# Patient Record
Sex: Female | Born: 1955
Health system: Southern US, Community
[De-identification: ages and names within clinical notes are randomized; demographics above are authoritative.]

## PROBLEM LIST (undated history)

## (undated) ENCOUNTER — Telehealth

## (undated) ENCOUNTER — Ambulatory Visit: Payer: MEDICARE | Attending: Nephrology | Primary: Nephrology

## (undated) ENCOUNTER — Ambulatory Visit

## (undated) ENCOUNTER — Ambulatory Visit: Payer: MEDICARE | Attending: Foot & Ankle Surgery | Primary: Foot & Ankle Surgery

## (undated) ENCOUNTER — Ambulatory Visit: Payer: MEDICARE

## (undated) ENCOUNTER — Encounter

## (undated) ENCOUNTER — Encounter: Attending: Nephrology | Primary: Nephrology

## (undated) ENCOUNTER — Ambulatory Visit
Payer: MEDICARE | Attending: Student in an Organized Health Care Education/Training Program | Primary: Student in an Organized Health Care Education/Training Program

## (undated) ENCOUNTER — Non-Acute Institutional Stay: Payer: MEDICARE

## (undated) ENCOUNTER — Encounter
Attending: Student in an Organized Health Care Education/Training Program | Primary: Student in an Organized Health Care Education/Training Program

## (undated) ENCOUNTER — Telehealth
Attending: Student in an Organized Health Care Education/Training Program | Primary: Student in an Organized Health Care Education/Training Program

## (undated) ENCOUNTER — Encounter: Attending: Ambulatory Care | Primary: Ambulatory Care

## (undated) ENCOUNTER — Ambulatory Visit: Payer: MEDICARE | Attending: Surgery | Primary: Surgery

## (undated) ENCOUNTER — Encounter: Payer: MEDICARE | Attending: Urology | Primary: Urology

## (undated) ENCOUNTER — Encounter: Payer: MEDICARE | Attending: Dentist | Primary: Dentist

## (undated) ENCOUNTER — Encounter
Payer: MEDICARE | Attending: Student in an Organized Health Care Education/Training Program | Primary: Student in an Organized Health Care Education/Training Program

## (undated) ENCOUNTER — Telehealth: Attending: Nephrology | Primary: Nephrology

## (undated) ENCOUNTER — Ambulatory Visit: Attending: Internal Medicine | Primary: Internal Medicine

## (undated) ENCOUNTER — Encounter: Payer: MEDICARE | Attending: Nephrology | Primary: Nephrology

## (undated) ENCOUNTER — Ambulatory Visit: Payer: MEDICARE | Attending: Urology | Primary: Urology

## (undated) DIAGNOSIS — I779 Disorder of arteries and arterioles, unspecified: Secondary | ICD-10-CM

## (undated) DIAGNOSIS — I2510 Atherosclerotic heart disease of native coronary artery without angina pectoris: Secondary | ICD-10-CM

## (undated) DIAGNOSIS — E11319 Type 2 diabetes mellitus with unspecified diabetic retinopathy without macular edema: Secondary | ICD-10-CM

## (undated) DIAGNOSIS — Z992 Dependence on renal dialysis: Secondary | ICD-10-CM

## (undated) DIAGNOSIS — N186 End stage renal disease: Secondary | ICD-10-CM

## (undated) DIAGNOSIS — I219 Acute myocardial infarction, unspecified: Secondary | ICD-10-CM

## (undated) DIAGNOSIS — K635 Polyp of colon: Secondary | ICD-10-CM

## (undated) DIAGNOSIS — I209 Angina pectoris, unspecified: Secondary | ICD-10-CM

## (undated) DIAGNOSIS — Z8709 Personal history of other diseases of the respiratory system: Secondary | ICD-10-CM

## (undated) DIAGNOSIS — Z87891 Personal history of nicotine dependence: Secondary | ICD-10-CM

## (undated) DIAGNOSIS — U071 COVID-19: Secondary | ICD-10-CM

## (undated) DIAGNOSIS — E785 Hyperlipidemia, unspecified: Secondary | ICD-10-CM

## (undated) DIAGNOSIS — I739 Peripheral vascular disease, unspecified: Secondary | ICD-10-CM

## (undated) DIAGNOSIS — D638 Anemia in other chronic diseases classified elsewhere: Secondary | ICD-10-CM

## (undated) DIAGNOSIS — I340 Nonrheumatic mitral (valve) insufficiency: Secondary | ICD-10-CM

## (undated) DIAGNOSIS — I10 Essential (primary) hypertension: Secondary | ICD-10-CM

## (undated) DIAGNOSIS — I5032 Chronic diastolic (congestive) heart failure: Secondary | ICD-10-CM

## (undated) DIAGNOSIS — K5909 Other constipation: Secondary | ICD-10-CM

## (undated) DIAGNOSIS — Z8701 Personal history of pneumonia (recurrent): Secondary | ICD-10-CM

## (undated) DIAGNOSIS — E1140 Type 2 diabetes mellitus with diabetic neuropathy, unspecified: Secondary | ICD-10-CM

## (undated) DIAGNOSIS — J449 Chronic obstructive pulmonary disease, unspecified: Secondary | ICD-10-CM

## (undated) DIAGNOSIS — I38 Endocarditis, valve unspecified: Secondary | ICD-10-CM

## (undated) DIAGNOSIS — E041 Nontoxic single thyroid nodule: Secondary | ICD-10-CM

## (undated) DIAGNOSIS — D573 Sickle-cell trait: Secondary | ICD-10-CM

## (undated) HISTORY — DX: Disorder of arteries and arterioles, unspecified: I77.9

## (undated) HISTORY — PX: KIDNEY TRANSPLANT: SHX239

## (undated) HISTORY — DX: Personal history of other diseases of the respiratory system: Z87.09

## (undated) HISTORY — DX: Endocarditis, valve unspecified: I38

## (undated) HISTORY — PX: PTCA: SHX146

## (undated) HISTORY — PX: ESOPHAGOGASTRODUODENOSCOPY: SHX1529

## (undated) HISTORY — DX: COVID-19: U07.1

## (undated) HISTORY — DX: Acute myocardial infarction, unspecified: I21.9

## (undated) HISTORY — PX: CYSTOURETHROSCOPY: SHX476

## (undated) HISTORY — PX: ABDOMINAL HYSTERECTOMY: SHX81

## (undated) HISTORY — DX: Nonrheumatic mitral (valve) insufficiency: I34.0

## (undated) HISTORY — PX: DILATION AND CURETTAGE OF UTERUS: SHX78

## (undated) HISTORY — DX: Peripheral vascular disease, unspecified: I73.9

## (undated) HISTORY — DX: Nontoxic single thyroid nodule: E04.1

## (undated) HISTORY — DX: Atherosclerotic heart disease of native coronary artery without angina pectoris: I25.10

## (undated) HISTORY — DX: Chronic diastolic (congestive) heart failure: I50.32

## (undated) HISTORY — PX: OTHER SURGICAL HISTORY: SHX169

## (undated) HISTORY — PX: TUBAL LIGATION: SHX77

## (undated) HISTORY — PX: CARDIAC CATHETERIZATION: SHX172

## (undated) HISTORY — DX: Personal history of pneumonia (recurrent): Z87.01

## (undated) HISTORY — PX: EYE SURGERY: SHX253

## (undated) HISTORY — DX: Anemia in other chronic diseases classified elsewhere: D63.8

## (undated) HISTORY — PX: THROMBECTOMY / ARTERIOVENOUS GRAFT REVISION: SUR1351

## (undated) HISTORY — DX: Personal history of nicotine dependence: Z87.891

## (undated) MED ORDER — PREGABALIN 75 MG CAPSULE: capsule | 3 refills | 30 days

---
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
## 2006-03-28 ENCOUNTER — Other Ambulatory Visit: Payer: Self-pay

## 2006-03-28 ENCOUNTER — Emergency Department: Payer: Self-pay | Admitting: Emergency Medicine

## 2007-09-15 ENCOUNTER — Emergency Department: Payer: Self-pay | Admitting: Emergency Medicine

## 2008-07-24 ENCOUNTER — Emergency Department: Payer: Self-pay | Admitting: Unknown Physician Specialty

## 2010-06-21 ENCOUNTER — Emergency Department: Payer: Self-pay | Admitting: Unknown Physician Specialty

## 2010-06-21 LAB — BASIC METABOLIC PANEL WITH GFR: Creatinine: 1.6 mg/dL — AB (ref 0.5–1.1)

## 2010-11-05 ENCOUNTER — Ambulatory Visit: Payer: Self-pay | Admitting: Family Medicine

## 2010-11-10 ENCOUNTER — Inpatient Hospital Stay: Payer: Self-pay | Admitting: Internal Medicine

## 2011-01-22 ENCOUNTER — Ambulatory Visit: Payer: Self-pay | Admitting: Family Medicine

## 2011-03-18 ENCOUNTER — Encounter (INDEPENDENT_AMBULATORY_CARE_PROVIDER_SITE_OTHER): Payer: 59 | Admitting: Ophthalmology

## 2011-03-18 DIAGNOSIS — H2510 Age-related nuclear cataract, unspecified eye: Secondary | ICD-10-CM

## 2011-03-18 DIAGNOSIS — H43819 Vitreous degeneration, unspecified eye: Secondary | ICD-10-CM

## 2011-03-18 DIAGNOSIS — E11359 Type 2 diabetes mellitus with proliferative diabetic retinopathy without macular edema: Secondary | ICD-10-CM

## 2011-03-18 DIAGNOSIS — H4310 Vitreous hemorrhage, unspecified eye: Secondary | ICD-10-CM

## 2011-03-21 ENCOUNTER — Encounter (INDEPENDENT_AMBULATORY_CARE_PROVIDER_SITE_OTHER): Payer: 59 | Admitting: Ophthalmology

## 2011-03-21 DIAGNOSIS — E11359 Type 2 diabetes mellitus with proliferative diabetic retinopathy without macular edema: Secondary | ICD-10-CM

## 2011-03-26 ENCOUNTER — Encounter (INDEPENDENT_AMBULATORY_CARE_PROVIDER_SITE_OTHER): Payer: 59 | Admitting: Ophthalmology

## 2011-04-09 ENCOUNTER — Encounter (INDEPENDENT_AMBULATORY_CARE_PROVIDER_SITE_OTHER): Payer: 59 | Admitting: Ophthalmology

## 2011-04-09 DIAGNOSIS — E11359 Type 2 diabetes mellitus with proliferative diabetic retinopathy without macular edema: Secondary | ICD-10-CM

## 2011-04-09 DIAGNOSIS — H4310 Vitreous hemorrhage, unspecified eye: Secondary | ICD-10-CM

## 2011-04-15 DIAGNOSIS — E113593 Type 2 diabetes mellitus with proliferative diabetic retinopathy without macular edema, bilateral: Secondary | ICD-10-CM

## 2011-04-15 DIAGNOSIS — H4313 Vitreous hemorrhage, bilateral: Secondary | ICD-10-CM

## 2011-04-15 DIAGNOSIS — H3343 Traction detachment of retina, bilateral: Secondary | ICD-10-CM

## 2011-04-15 NOTE — H&P (Signed)
Theresa [name] is an 55 y.o. female.   Chief Complaint:  Severe decrease in vision OS and OD HPI: 55 yo bf with severe diabetes mellitus.  Has lost vision.  No past medical history on file.: COPD   No past surgical history on file.  No family history on file. +diabetes in many family members Social History:  does not have a smoking history on file. She does not have any smokeless tobacco history on file. Her alcohol and drug histories not on file. Smoked cigaretts until Feb 2012.Marland Kitchen  Stopped alcohol use 15 yrs ago  Allergies: Allergies not on file  No current facility-administered medications on file as of .   No current outpatient prescriptions on file as of .    Review of systems otherwise negative  There were no vitals taken for this visit.  Physical exam: Mental status: oriented x3. Eyes: See scanned eye exam associated with this date/encounter Ears, [name], Throat: within normal limits Neck: Within Normal limits General: within normal limits Chest: Within normal limits Breast: deferred Heart: Systolic murmur Abdomen: Within normal limits GU: deferred Extremities: within normal limits Skin: within normal limits  Assessment/Plan Proliferative diabetic retinopathy and traction retinal detachment OS Plan: To [facility] for repair of tractional retinal detachment OS  Theresa [name] 04/15/2011, 3:08 PM

## 2011-04-16 ENCOUNTER — Encounter (HOSPITAL_COMMUNITY): Payer: Self-pay | Admitting: Pharmacy Technician

## 2011-04-17 ENCOUNTER — Encounter (HOSPITAL_COMMUNITY)
Admission: RE | Admit: 2011-04-17 | Discharge: 2011-04-17 | Disposition: A | Discharge status: Home / Self Care | Payer: 59 | Source: Ambulatory Visit | Attending: Ophthalmology | Admitting: Ophthalmology

## 2011-04-17 ENCOUNTER — Ambulatory Visit (HOSPITAL_COMMUNITY)
Admission: RE | Admit: 2011-04-17 | Discharge: 2011-04-17 | Disposition: A | Discharge status: Home / Self Care | Payer: 59 | Source: Ambulatory Visit | Attending: Ophthalmology | Admitting: Ophthalmology

## 2011-04-17 ENCOUNTER — Encounter (HOSPITAL_COMMUNITY): Payer: Self-pay

## 2011-04-17 DIAGNOSIS — Z01812 Encounter for preprocedural laboratory examination: Secondary | ICD-10-CM | POA: Insufficient documentation

## 2011-04-17 DIAGNOSIS — J4489 Other specified chronic obstructive pulmonary disease: Secondary | ICD-10-CM | POA: Insufficient documentation

## 2011-04-17 DIAGNOSIS — J449 Chronic obstructive pulmonary disease, unspecified: Secondary | ICD-10-CM | POA: Insufficient documentation

## 2011-04-17 DIAGNOSIS — I10 Essential (primary) hypertension: Secondary | ICD-10-CM | POA: Insufficient documentation

## 2011-04-17 DIAGNOSIS — Z01818 Encounter for other preprocedural examination: Secondary | ICD-10-CM | POA: Insufficient documentation

## 2011-04-17 HISTORY — DX: Sickle-cell trait: D57.3

## 2011-04-17 HISTORY — DX: Polyp of colon: K63.5

## 2011-04-17 HISTORY — DX: Other constipation: K59.09

## 2011-04-17 HISTORY — DX: Chronic obstructive pulmonary disease, unspecified: J44.9

## 2011-04-17 HISTORY — DX: Essential (primary) hypertension: I10

## 2011-04-17 HISTORY — DX: Peripheral vascular disease, unspecified: I73.9

## 2011-04-17 HISTORY — DX: Hyperlipidemia, unspecified: E78.5

## 2011-04-17 LAB — CBC
MCH: 30 pg (ref 26.0–34.0)
Platelets: 307 10*3/uL (ref 150–400)
RBC: 3.1 MIL/uL — ABNORMAL LOW (ref 3.87–5.11)
WBC: 13 10*3/uL — ABNORMAL HIGH (ref 4.0–10.5)

## 2011-04-17 LAB — SURGICAL PCR SCREEN
MRSA, PCR: NEGATIVE
Staphylococcus aureus: NEGATIVE

## 2011-04-17 LAB — BASIC METABOLIC PANEL
CO2: 24 mEq/L (ref 19–32)
Calcium: 9.6 mg/dL (ref 8.4–10.5)
Potassium: 4.9 mEq/L (ref 3.5–5.1)
Sodium: 140 mEq/L (ref 135–145)

## 2011-04-17 NOTE — Progress Notes (Deleted)
Also requested echo and ekg from [facility]

## 2011-04-17 NOTE — Pre-Procedure Instructions (Signed)
[place]  04/17/2011   Your procedure is scheduled on: Tues,Nov 13th  Report to [place] at  Call this number if you have problems the morning of surgery: [phone]   Remember:   Do not eat food:After Midnight.  Do not drink clear liquids: 4 Hours before arrival.  Take these medicines the morning of surgery with A SIP OF WATER:Albuterol,QVAR,Nasal Spray,Gapapentin,Losartan--Bring your inhalers with you!!  Do not wear jewelry, make-up or nail polish.  Do not wear lotions, powders, or perfumes. You may wear deodorant.  Do not shave 48 hours prior to surgery.  Do not bring valuables to the hospital.  Contacts, dentures or bridgework may not be worn into surgery.  Leave suitcase in the car. After surgery it may be brought to your room.  For patients admitted to the hospital, checkout time is 11:00 AM the day of discharge.   Patients discharged the day of surgery will not be allowed to drive home.  Name and phone number of your driver: family  Special Instructions: CHG Shower Use Special Wash: 1/2 bottle night before surgery and 1/2 bottle morning of surgery.   Please read over the following fact sheets that you were given: Pain Booklet, Coughing and Deep Breathing, MRSA Information and Surgical Site Infection Prevention

## 2011-04-17 NOTE — Progress Notes (Signed)
Requested EKG and ECHO from [facility] as well

## 2011-04-17 NOTE — Progress Notes (Deleted)
ekg and echo requested from Dr.[name] office

## 2011-04-17 NOTE — Progress Notes (Signed)
Pt doesn't have a cardiologist;pt is maintained by medical MD for HTN/Hyperlipidemia

## 2011-04-19 ENCOUNTER — Encounter (HOSPITAL_COMMUNITY): Payer: Self-pay

## 2011-04-21 MED ORDER — TROPICAMIDE 1 % OP SOLN
1.0000 [drp] | OPHTHALMIC | Status: AC
  Administered 2011-04-22 (×3): 1 [drp] via OPHTHALMIC

## 2011-04-21 MED ORDER — PHENYLEPHRINE HCL 2.5 % OP SOLN
1.0000 [drp] | OPHTHALMIC | Status: AC
  Administered 2011-04-22 (×3): 1 [drp] via OPHTHALMIC
  Filled 2011-04-21: qty 3

## 2011-04-21 MED ORDER — GATIFLOXACIN 0.5 % OP SOLN
1.0000 [drp] | OPHTHALMIC | Status: AC
  Administered 2011-04-22 (×3): 1 [drp] via OPHTHALMIC

## 2011-04-21 MED ORDER — ATROPINE SULFATE 1 % OP SOLN
1.0000 [drp] | OPHTHALMIC | Status: AC
  Administered 2011-04-22 (×3): 1 [drp] via OPHTHALMIC

## 2011-04-21 NOTE — Consult Note (Signed)
Anesthesia:  Patient is a 55 year old female for vitrectomy on 04/22/11.  Hx + for HTN, hyperlipidemia, DM, COPD, R PNA 11/2010, PVD, CRI, and Sickle Cell trait.  She does not see a Cardiologist per PAT RN notes, but she did have an echocardiogram read by Cardiologist Dr. Clayborn Bigness on 11/10/10.  This showed EF 45-50%, mild global hypokinesis of the LV, mild MS, mod-sev MR, mild-mod TR, mild AR.  Per the [facility] discharge summary from this visit, Cardiology was not actually consulted.  Her Cr around that time was 1.6-1.8.  Preoperative labs now show a Cr. 2.24, BUN 31.  H/H 9.3/28.2, WBC 13.0.  Her CXR showed no acute process.  I reviewed her cardiac and renal hx with Dr. Orene Desanctis.  Her Anesthesiologist will evaluate her pre-operatively, and if patient appears asymptomatic from her valvular disease then anticipate that she can proceed with this procedure.

## 2011-04-21 NOTE — Consult Note (Signed)
Anesthesia:  See my previous note.  I had left Theresa [name] a message earlier today, and she just returned my phone call.  She reports no one has reviewed her prior echo results with her.  She says her PCP does have a copy of these records.  She denies CP/SOB/edema.  I gave her a general summary of her echo and renal results and encouraged her discuss these findings with her PCP, as she will eventually need follow-up.  She says she plans to discuss this with her PCP at her next appointment in 06/2011.

## 2011-04-21 NOTE — Anesthesia Preprocedure Evaluation (Addendum)
Anesthesia Evaluation  Patient identified by MRN, date of birth, ID band Patient awake    Reviewed: Allergy & Precautions, H&P , NPO status , Patient's Chart, lab work & pertinent test results  Airway Mallampati: I TM Distance: >3 FB Neck ROM: Full    Dental  (+) Teeth Intact and Dental Advisory Given   Pulmonary pneumonia , COPD   Pulmonary exam normal       Cardiovascular hypertension, II+ Valvular Problems/Murmurs (Mild mitral stenosis)  No Cardiologist, but had echo at [facility] 11/10/10 showing EF 45-50%, mild MS, mod-sev MR, mild-mod TR, mild AR  EKG on paper chart from 11/2010 NSR   Neuro/Psych    GI/Hepatic   Endo/Other  Diabetes mellitus-, Well Controlled, Type 2, Oral Hypoglycemic Agents  Renal/GU Renal InsufficiencyRenal disease (Cr. 2.24 on 04/17/11. Will rec to Dr. Zigmund Daniel to repeat and/or get a renal consult)     Musculoskeletal   Abdominal   Peds  Hematology  (+) Blood dyscrasia, Sickle cell trait ,   Anesthesia Other Findings   Reproductive/Obstetrics                       Anesthesia Physical Anesthesia Plan  ASA: III  Anesthesia Plan: General   Post-op Pain Management:    Induction: Intravenous  Airway Management Planned: Oral ETT  Additional Equipment:   Intra-op Plan:   Post-operative Plan: Extubation in OR  Informed Consent: I have reviewed the patients History and Physical, chart, labs and discussed the procedure including the risks, benefits and alternatives for the proposed anesthesia with the patient or authorized representative who has indicated his/her understanding and acceptance.   Dental advisory given  Plan Discussed with: CRNA and Surgeon  Anesthesia Plan Comments:         Anesthesia Quick Evaluation

## 2011-04-22 ENCOUNTER — Encounter (HOSPITAL_COMMUNITY): Payer: Self-pay | Admitting: Vascular Surgery

## 2011-04-22 ENCOUNTER — Ambulatory Visit (HOSPITAL_COMMUNITY): Payer: 59 | Admitting: Vascular Surgery

## 2011-04-22 ENCOUNTER — Encounter (HOSPITAL_COMMUNITY)
Admission: RE | Disposition: A | Discharge status: Home / Self Care | Payer: Self-pay | Source: Ambulatory Visit | Attending: Ophthalmology

## 2011-04-22 ENCOUNTER — Ambulatory Visit (HOSPITAL_COMMUNITY)
Admission: RE | Admit: 2011-04-22 | Discharge: 2011-04-23 | Disposition: A | Discharge status: Home / Self Care | Payer: 59 | Source: Ambulatory Visit | Attending: Ophthalmology | Admitting: Ophthalmology

## 2011-04-22 DIAGNOSIS — E11359 Type 2 diabetes mellitus with proliferative diabetic retinopathy without macular edema: Secondary | ICD-10-CM

## 2011-04-22 DIAGNOSIS — H4310 Vitreous hemorrhage, unspecified eye: Secondary | ICD-10-CM

## 2011-04-22 DIAGNOSIS — J4489 Other specified chronic obstructive pulmonary disease: Secondary | ICD-10-CM | POA: Insufficient documentation

## 2011-04-22 DIAGNOSIS — H3340 Traction detachment of retina, unspecified eye: Secondary | ICD-10-CM

## 2011-04-22 DIAGNOSIS — H4313 Vitreous hemorrhage, bilateral: Secondary | ICD-10-CM

## 2011-04-22 DIAGNOSIS — E1165 Type 2 diabetes mellitus with hyperglycemia: Secondary | ICD-10-CM

## 2011-04-22 DIAGNOSIS — E113593 Type 2 diabetes mellitus with proliferative diabetic retinopathy without macular edema, bilateral: Secondary | ICD-10-CM

## 2011-04-22 DIAGNOSIS — H3343 Traction detachment of retina, bilateral: Secondary | ICD-10-CM

## 2011-04-22 DIAGNOSIS — E1139 Type 2 diabetes mellitus with other diabetic ophthalmic complication: Secondary | ICD-10-CM | POA: Insufficient documentation

## 2011-04-22 DIAGNOSIS — I10 Essential (primary) hypertension: Secondary | ICD-10-CM | POA: Insufficient documentation

## 2011-04-22 DIAGNOSIS — J449 Chronic obstructive pulmonary disease, unspecified: Secondary | ICD-10-CM | POA: Insufficient documentation

## 2011-04-22 HISTORY — PX: PARS PLANA VITRECTOMY: SHX2166

## 2011-04-22 LAB — GLUCOSE, CAPILLARY: Glucose-Capillary: 91 mg/dL (ref 70–99)

## 2011-04-22 SURGERY — PARS PLANA VITRECTOMY WITH 25 GAUGE
Anesthesia: General | Site: Eye | Laterality: Left | Wound class: Clean

## 2011-04-22 MED ORDER — DEXAMETHASONE SODIUM PHOSPHATE 10 MG/ML IJ SOLN
INTRAMUSCULAR | Status: DC | PRN
  Administered 2011-04-22: 1 mL

## 2011-04-22 MED ORDER — SIMVASTATIN 40 MG PO TABS
40.0000 mg | ORAL_TABLET | Freq: Every day | ORAL | Status: DC
  Administered 2011-04-22: 40 mg via ORAL
  Filled 2011-04-22 (×2): qty 1

## 2011-04-22 MED ORDER — GLIPIZIDE 10 MG PO TABS
10.0000 mg | ORAL_TABLET | Freq: Two times a day (BID) | ORAL | Status: DC
  Administered 2011-04-22 – 2011-04-23 (×2): 10 mg via ORAL
  Filled 2011-04-22 (×4): qty 1

## 2011-04-22 MED ORDER — GABAPENTIN 600 MG PO TABS
600.0000 mg | ORAL_TABLET | Freq: Every day | ORAL | Status: DC
  Administered 2011-04-22: 600 mg via ORAL
  Filled 2011-04-22 (×2): qty 1

## 2011-04-22 MED ORDER — INSULIN ASPART 100 UNIT/ML ~~LOC~~ SOLN
0.0000 [IU] | SUBCUTANEOUS | Status: DC
  Administered 2011-04-23: 8 [IU] via SUBCUTANEOUS
  Administered 2011-04-23: 0 [IU] via SUBCUTANEOUS
  Filled 2011-04-22: qty 3

## 2011-04-22 MED ORDER — EPHEDRINE SULFATE 50 MG/ML IJ SOLN
INTRAMUSCULAR | Status: DC | PRN
  Administered 2011-04-22: 2.5 mg via INTRAVENOUS
  Administered 2011-04-22 (×2): 5 mg via INTRAVENOUS

## 2011-04-22 MED ORDER — ONDANSETRON HCL 4 MG/2ML IJ SOLN: 4.0000 mg | Freq: Once | INTRAMUSCULAR | Status: DC | PRN

## 2011-04-22 MED ORDER — LABETALOL HCL 5 MG/ML IV SOLN
20.0000 mg | Freq: Once | INTRAVENOUS | Status: DC
  Filled 2011-04-22: qty 4

## 2011-04-22 MED ORDER — HYDROMORPHONE HCL PF 1 MG/ML IJ SOLN
0.2500 mg | INTRAMUSCULAR | Status: DC | PRN
  Administered 2011-04-22: 0.25 mg via INTRAVENOUS

## 2011-04-22 MED ORDER — MAGNESIUM HYDROXIDE 400 MG/5ML PO SUSP: 15.0000 mL | Freq: Four times a day (QID) | ORAL | Status: DC | PRN

## 2011-04-22 MED ORDER — BACITRACIN-POLYMYXIN B 500-10000 UNIT/GM OP OINT
TOPICAL_OINTMENT | OPHTHALMIC | Status: DC | PRN
  Administered 2011-04-22: 1 via OPHTHALMIC

## 2011-04-22 MED ORDER — LACTATED RINGERS IV SOLN: INTRAVENOUS | Status: DC | PRN

## 2011-04-22 MED ORDER — BUPIVACAINE HCL 0.75 % IJ SOLN
INTRAMUSCULAR | Status: DC | PRN
  Administered 2011-04-22: 20 mL

## 2011-04-22 MED ORDER — EPINEPHRINE HCL 1 MG/ML IJ SOLN
INTRAMUSCULAR | Status: DC | PRN
  Administered 2011-04-22: .3 mL

## 2011-04-22 MED ORDER — TETRACAINE HCL 0.5 % OP SOLN
1.0000 [drp] | Freq: Once | OPHTHALMIC | Status: AC
  Administered 2011-04-23: 1 [drp] via OPHTHALMIC
  Filled 2011-04-22: qty 2

## 2011-04-22 MED ORDER — MORPHINE SULFATE 2 MG/ML IJ SOLN: 0.0500 mg/kg | INTRAMUSCULAR | Status: DC | PRN

## 2011-04-22 MED ORDER — PREDNISOLONE ACETATE 1 % OP SUSP
1.0000 [drp] | Freq: Four times a day (QID) | OPHTHALMIC | Status: DC
  Administered 2011-04-23: 1 [drp] via OPHTHALMIC
  Filled 2011-04-22: qty 1

## 2011-04-22 MED ORDER — ALBUTEROL SULFATE HFA 108 (90 BASE) MCG/ACT IN AERS: 2.0000 | INHALATION_SPRAY | Freq: Four times a day (QID) | RESPIRATORY_TRACT | Status: DC | PRN

## 2011-04-22 MED ORDER — HYDROMORPHONE HCL PF 1 MG/ML IJ SOLN
INTRAMUSCULAR | Status: AC
  Filled 2011-04-22: qty 1

## 2011-04-22 MED ORDER — PREDNISOLONE ACETATE 1 % OP SUSP: 1.0000 [drp] | Freq: Four times a day (QID) | OPHTHALMIC | Status: DC

## 2011-04-22 MED ORDER — BISACODYL 5 MG PO TBEC: 5.0000 mg | DELAYED_RELEASE_TABLET | Freq: Every day | ORAL | Status: DC | PRN

## 2011-04-22 MED ORDER — ONDANSETRON HCL 4 MG/2ML IJ SOLN
4.0000 mg | Freq: Four times a day (QID) | INTRAMUSCULAR | Status: DC
  Administered 2011-04-23: 4 mg via INTRAVENOUS
  Filled 2011-04-22 (×4): qty 2

## 2011-04-22 MED ORDER — BSS PLUS IO SOLN
INTRAOCULAR | Status: DC | PRN
  Administered 2011-04-22: 1 via OPHTHALMIC

## 2011-04-22 MED ORDER — MIDAZOLAM HCL 5 MG/5ML IJ SOLN
INTRAMUSCULAR | Status: DC | PRN
  Administered 2011-04-22: 1 mg via INTRAVENOUS

## 2011-04-22 MED ORDER — DEXTROSE 50 % IV SOLN: 25.0000 mL | Freq: Once | INTRAVENOUS | Status: DC

## 2011-04-22 MED ORDER — ONDANSETRON HCL 4 MG/2ML IJ SOLN
INTRAMUSCULAR | Status: AC
  Administered 2011-04-23: 4 mg via INTRAVENOUS
  Filled 2011-04-22: qty 2

## 2011-04-22 MED ORDER — SODIUM CHLORIDE 0.9 % IJ SOLN
INTRAMUSCULAR | Status: DC | PRN
  Administered 2011-04-22: 10 mL

## 2011-04-22 MED ORDER — HYDROCODONE-ACETAMINOPHEN 5-325 MG PO TABS
1.0000 | ORAL_TABLET | ORAL | Status: DC | PRN
  Administered 2011-04-22: 1 via ORAL
  Administered 2011-04-22 – 2011-04-23 (×2): 2 via ORAL
  Filled 2011-04-22: qty 1
  Filled 2011-04-22 (×2): qty 2

## 2011-04-22 MED ORDER — BSS IO SOLN
INTRAOCULAR | Status: DC | PRN
  Administered 2011-04-22: 15 mL via INTRAOCULAR

## 2011-04-22 MED ORDER — POLYMYXIN B SULFATE 500000 UNITS IJ SOLR
INTRAMUSCULAR | Status: DC | PRN
  Administered 2011-04-22: 500000 [IU]

## 2011-04-22 MED ORDER — DEXTROSE 50 % IV SOLN
12.5000 g | Freq: Once | INTRAVENOUS | Status: AC
  Administered 2011-04-22: 25 g via INTRAVENOUS

## 2011-04-22 MED ORDER — LOSARTAN POTASSIUM 25 MG PO TABS
25.0000 mg | ORAL_TABLET | Freq: Every day | ORAL | Status: DC
  Administered 2011-04-22 – 2011-04-23 (×2): 25 mg via ORAL
  Filled 2011-04-22 (×2): qty 1

## 2011-04-22 MED ORDER — ROCURONIUM BROMIDE 100 MG/10ML IV SOLN
INTRAVENOUS | Status: DC | PRN
  Administered 2011-04-22: 50 mg via INTRAVENOUS

## 2011-04-22 MED ORDER — IBUPROFEN 600 MG PO TABS
600.0000 mg | ORAL_TABLET | Freq: Four times a day (QID) | ORAL | Status: DC | PRN
Start: 2011-04-22 — End: 2011-04-23
  Administered 2011-04-23: 600 mg via ORAL
  Filled 2011-04-22: qty 1

## 2011-04-22 MED ORDER — LATANOPROST 0.005 % OP SOLN
1.0000 [drp] | Freq: Every day | OPHTHALMIC | Status: DC
  Filled 2011-04-22: qty 2.5

## 2011-04-22 MED ORDER — ACETAMINOPHEN 325 MG PO TABS: 650.0000 mg | ORAL_TABLET | Freq: Four times a day (QID) | ORAL | Status: DC | PRN

## 2011-04-22 MED ORDER — LACTATED RINGERS IV SOLN: INTRAVENOUS | Status: DC

## 2011-04-22 MED ORDER — ATROPINE SULFATE 1 % OP SOLN
OPHTHALMIC | Status: DC | PRN
  Administered 2011-04-22: 1 [drp]

## 2011-04-22 MED ORDER — BRIMONIDINE TARTRATE 0.2 % OP SOLN
1.0000 [drp] | Freq: Two times a day (BID) | OPHTHALMIC | Status: DC
  Administered 2011-04-23 (×2): 1 [drp] via OPHTHALMIC
  Filled 2011-04-22: qty 5

## 2011-04-22 MED ORDER — GLYCOPYRROLATE 0.2 MG/ML IJ SOLN
INTRAMUSCULAR | Status: DC | PRN
  Administered 2011-04-22: .7 mg via INTRAVENOUS

## 2011-04-22 MED ORDER — ONDANSETRON HCL 4 MG/2ML IJ SOLN
INTRAMUSCULAR | Status: DC | PRN
  Administered 2011-04-22: 4 mg via INTRAVENOUS

## 2011-04-22 MED ORDER — NEOSTIGMINE METHYLSULFATE 1 MG/ML IJ SOLN
INTRAMUSCULAR | Status: DC | PRN
  Administered 2011-04-22: 4 mg via INTRAVENOUS

## 2011-04-22 MED ORDER — SODIUM CHLORIDE 0.9 % IV SOLN
INTRAVENOUS | Status: DC
  Administered 2011-04-22: 11:00:00 via INTRAVENOUS

## 2011-04-22 MED ORDER — MEPERIDINE HCL 25 MG/ML IJ SOLN: 6.2500 mg | INTRAMUSCULAR | Status: DC | PRN

## 2011-04-22 MED ORDER — FENTANYL CITRATE 0.05 MG/ML IJ SOLN
INTRAMUSCULAR | Status: DC | PRN
  Administered 2011-04-22 (×3): 50 ug via INTRAVENOUS

## 2011-04-22 MED ORDER — GENTAMICIN SULFATE 40 MG/ML IJ SOLN
INTRAMUSCULAR | Status: DC | PRN
  Administered 2011-04-22: 80 mg

## 2011-04-22 MED ORDER — MORPHINE SULFATE 2 MG/ML IJ SOLN: 1.0000 mg | INTRAMUSCULAR | Status: DC | PRN

## 2011-04-22 MED ORDER — SODIUM CHLORIDE 0.45 % IV SOLN: INTRAVENOUS | Status: DC

## 2011-04-22 MED ORDER — LIDOCAINE HCL 4 % IJ SOLN
INTRAMUSCULAR | Status: DC | PRN
  Administered 2011-04-22: 5 mL

## 2011-04-22 MED ORDER — PROVISC 10 MG/ML IO SOLN
INTRAOCULAR | Status: DC | PRN
  Administered 2011-04-22: .85 mL via INTRAOCULAR

## 2011-04-22 MED ORDER — ACETAMINOPHEN 325 MG PO TABS: 325.0000 mg | ORAL_TABLET | ORAL | Status: DC | PRN

## 2011-04-22 MED ORDER — MINERAL OIL LIGHT 100 % EX OIL
TOPICAL_OIL | CUTANEOUS | Status: DC | PRN
  Administered 2011-04-22: 1 via TOPICAL

## 2011-04-22 MED ORDER — PROPOFOL 10 MG/ML IV EMUL
INTRAVENOUS | Status: DC | PRN
  Administered 2011-04-22: 150 mg via INTRAVENOUS

## 2011-04-22 MED ORDER — SODIUM CHLORIDE 0.9 % IV SOLN
INTRAVENOUS | Status: DC | PRN
  Administered 2011-04-22 (×2): via INTRAVENOUS

## 2011-04-22 MED ORDER — GATIFLOXACIN 0.5 % OP SOLN
1.0000 [drp] | Freq: Four times a day (QID) | OPHTHALMIC | Status: DC
  Administered 2011-04-23 (×2): 1 [drp] via OPHTHALMIC
  Filled 2011-04-22: qty 2.5

## 2011-04-22 MED ORDER — ACETAZOLAMIDE SODIUM 500 MG IJ SOLR
500.0000 mg | Freq: Once | INTRAMUSCULAR | Status: AC
  Administered 2011-04-23: 500 mg via INTRAVENOUS
  Filled 2011-04-22: qty 500

## 2011-04-22 MED ORDER — BECLOMETHASONE DIPROPIONATE 40 MCG/ACT IN AERS
1.0000 | INHALATION_SPRAY | Freq: Every day | RESPIRATORY_TRACT | Status: DC | PRN
  Filled 2011-04-22: qty 8.7

## 2011-04-22 MED ORDER — HEMOSTATIC AGENTS (NO CHARGE) OPTIME
TOPICAL | Status: DC | PRN
  Administered 2011-04-22: 1

## 2011-04-22 SURGICAL SUPPLY — 62 items
APPLICATOR DR MATTHEWS STRL (MISCELLANEOUS) IMPLANT
BLADE EYE CATARACT 19 1.4 BEAV (BLADE) IMPLANT
BLADE MVR KNIFE 19G (BLADE) ×2 IMPLANT
BLADE MVR KNIFE 20G (BLADE) IMPLANT
CANNULA DUAL BORE 23G (CANNULA) IMPLANT
CANNULA FLEX TIP 25G (CANNULA) IMPLANT
CANNULA IRRIGATING 27GX7/8 (BLADE) IMPLANT
CLOTH BEACON ORANGE TIMEOUT ST (SAFETY) ×2 IMPLANT
CORDS BIPOLAR (ELECTRODE) ×2 IMPLANT
COTTONBALL LRG STERILE PKG (GAUZE/BANDAGES/DRESSINGS) ×6 IMPLANT
DRAPE OPHTHALMIC 77X100 STRL (CUSTOM PROCEDURE TRAY) ×2 IMPLANT
EYE SHIELD UNIVERSAL CLEAR (GAUZE/BANDAGES/DRESSINGS) ×2 IMPLANT
FILTER BLUE MILLIPORE (MISCELLANEOUS) IMPLANT
FILTER STRAW FLUID ASPIR (MISCELLANEOUS) IMPLANT
FORCEPS ECKARDT ILM 25G SERR (OPHTHALMIC RELATED) IMPLANT
GLOVE OPTIFIT SS 6.5 STRL BRWN (GLOVE) ×2 IMPLANT
GLOVE SS BIOGEL STRL SZ 7 (GLOVE) ×1 IMPLANT
GLOVE SUPERSENSE BIOGEL SZ 7 (GLOVE) ×1
GLOVE SURG 8.5 LATEX PF (GLOVE) ×2 IMPLANT
GLOVE SURG SS PI 6.5 STRL IVOR (GLOVE) ×6 IMPLANT
GOWN STRL NON-REIN LRG LVL3 (GOWN DISPOSABLE) ×8 IMPLANT
ILLUMINATOR CHOW PICK 25GA (MISCELLANEOUS) ×2 IMPLANT
KIT ROOM TURNOVER OR (KITS) ×2 IMPLANT
KNIFE CRESCENT 2.5 55 ANG (BLADE) IMPLANT
LENS BIOM SUPER VIEW SET DISP (OPHTHALMIC RELATED) IMPLANT
MARKER SKIN DUAL TIP RULER LAB (MISCELLANEOUS) ×2 IMPLANT
MICROPICK 25G (MISCELLANEOUS)
NEEDLE 18GX1X1/2 (RX/OR ONLY) (NEEDLE) ×4 IMPLANT
NEEDLE 25GX 5/8IN NON SAFETY (NEEDLE) ×2 IMPLANT
NEEDLE 27GAX1X1/2 (NEEDLE) IMPLANT
NEEDLE HYPO 30X.5 LL (NEEDLE) ×2 IMPLANT
NS IRRIG 1000ML POUR BTL (IV SOLUTION) ×2 IMPLANT
OIL SILICONE OPHTHALMIC ADAPTO (Ophthalmic Related) ×2 IMPLANT
PACK VITRECTOMY CUSTOM (CUSTOM PROCEDURE TRAY) ×2 IMPLANT
PAD ARMBOARD 7.5X6 YLW CONV (MISCELLANEOUS) ×2 IMPLANT
PAD EYE OVAL STERILE LF (GAUZE/BANDAGES/DRESSINGS) ×2 IMPLANT
PAK VITRECTOMY PIK 25 GA (OPHTHALMIC RELATED) ×2 IMPLANT
PENCIL BIPOLAR 25GA STR DISP (OPHTHALMIC RELATED) ×2 IMPLANT
PICK MICROPICK 25G (MISCELLANEOUS) IMPLANT
PROBE DIRECTIONAL LASER (MISCELLANEOUS) ×2 IMPLANT
REPL STRA BRUSH NEEDLE (NEEDLE) IMPLANT
RESERVOIR BACK FLUSH (MISCELLANEOUS) IMPLANT
ROLLS DENTAL (MISCELLANEOUS) ×4 IMPLANT
SCRAPER DIAMOND DUST MEMBRANE (MISCELLANEOUS) IMPLANT
SPONGE SURGIFOAM ABS GEL 12-7 (HEMOSTASIS) ×2 IMPLANT
STOPCOCK 4 WAY LG BORE MALE ST (IV SETS) IMPLANT
SUT CHROMIC 7 0 TG140 8 (SUTURE) IMPLANT
SUT ETHILON 10 0 CS140 6 (SUTURE) IMPLANT
SUT ETHILON 9 0 TG140 8 (SUTURE) ×2 IMPLANT
SUT POLY NON ABSORB 10-0 8 STR (SUTURE) IMPLANT
SUT SILK 4 0 RB 1 (SUTURE) IMPLANT
SYR 20CC LL (SYRINGE) ×2 IMPLANT
SYR 50ML LL SCALE MARK (SYRINGE) IMPLANT
SYR 5ML LL (SYRINGE) IMPLANT
SYR BULB 3OZ (MISCELLANEOUS) ×2 IMPLANT
SYR TB 1ML LUER SLIP (SYRINGE) ×2 IMPLANT
SYRINGE 10CC LL (SYRINGE) ×2 IMPLANT
TAPE SURG TRANSPORE 1 IN (GAUZE/BANDAGES/DRESSINGS) ×1 IMPLANT
TAPE SURGICAL TRANSPORE 1 IN (GAUZE/BANDAGES/DRESSINGS) ×1
TOWEL OR 17X24 6PK STRL BLUE (TOWEL DISPOSABLE) ×6 IMPLANT
TROCAR CANNULA 25GA (CANNULA) ×2 IMPLANT
WATER STERILE IRR 1000ML POUR (IV SOLUTION) ×2 IMPLANT

## 2011-04-22 NOTE — Interval H&P Note (Signed)
I have examined the patient today and there is no change from the history and physical.

## 2011-04-22 NOTE — Transfer of Care (Signed)
Immediate Anesthesia Transfer of Care Note  Patient: Theresa [name]  Procedure(s) Performed:  PARS PLANA VITRECTOMY WITH 25 GAUGE - membrane peel, endolaser, gas fluid exchange, silicone oil, repair of complex traction retinal detachment  Patient Location: PACU  Anesthesia Type: General  Level of Consciousness: awake, alert  and oriented  Airway & Oxygen Therapy: Patient Spontanous Breathing and Patient connected to nasal cannula oxygen  Post-op Assessment: Report given to PACU RN and Post -op Vital signs reviewed and stable  Post vital signs: Reviewed and stable  Complications: No apparent anesthesia complications

## 2011-04-22 NOTE — Anesthesia Procedure Notes (Signed)
Procedure Name: Intubation Date/Time: 04/22/2011 11:52 AM Performed by: Angelique Holm Pre-anesthesia Checklist: Patient identified, Emergency Drugs available, Suction available, Patient being monitored and Timeout performed Patient Re-evaluated:Patient Re-evaluated prior to inductionOxygen Delivery Method: Circle System Utilized Preoxygenation: Pre-oxygenation with 100% oxygen Intubation Type: IV induction Ventilation: Mask ventilation without difficulty Laryngoscope Size: Mac and 3 Grade View: Grade IV Tube type: Oral Tube size: 7.0 mm Number of attempts: 1 Airway Equipment and Method: bougie stylet Placement Confirmation: breath sounds checked- equal and bilateral and positive ETCO2 Secured at: 21 cm Tube secured with: Tape Dental Injury: Teeth and Oropharynx as per pre-operative assessment

## 2011-04-22 NOTE — Anesthesia Postprocedure Evaluation (Signed)
  Anesthesia Post-op Note  Patient: Theresa [name]  Procedure(s) Performed:  PARS PLANA VITRECTOMY WITH 25 GAUGE - membrane peel, endolaser, gas fluid exchange, silicone oil, repair of complex traction retinal detachment  Patient Location: PACU  Anesthesia Type: General  Level of Consciousness: awake  Airway and Oxygen Therapy: Patient connected to nasal cannula oxygen  Post-op Pain: none  Post-op Assessment: Post-op Vital signs reviewed  Post-op Vital Signs: stable  Complications: No apparent anesthesia complications

## 2011-04-22 NOTE — Anesthesia Postprocedure Evaluation (Signed)
  Anesthesia Post-op Note  Patient: Theresa [name]  Procedure(s) Performed:  PARS PLANA VITRECTOMY WITH 25 GAUGE - membrane peel, endolaser, gas fluid exchange, silicone oil, repair of complex traction retinal detachment  Patient Location: PACU  Anesthesia Type: General  Level of Consciousness: awake and alert   Airway and Oxygen Therapy: Patient Spontanous Breathing and Patient connected to nasal cannula oxygen  Post-op Pain: none  Post-op Assessment: Post-op Vital signs reviewed, Patient's Cardiovascular Status Stable, Respiratory Function Stable, Patent Airway and Pain level controlled  Post-op Vital Signs: Reviewed and stable  Complications: No apparent anesthesia complications

## 2011-04-22 NOTE — Preoperative (Signed)
Beta Blockers   Reason not to administer Beta Blockers:Not Applicable 

## 2011-04-22 NOTE — Brief Op Note (Signed)
Brief Operative note   Preoperative diagnosis:  Traction Retinal Detachment OS  Postoperative diagnosis Traction Retinal Detachment OS  Procedures: Procedure(s) (LRB): PARS PLANA VITRECTOMY WITH 25 GAUGE (Left)   Surgeon:  [name], MD  Assistant:  Deatra Ina SA Anesthesia: General  Specimen: none  Estimated blood loss:  1cc  Complications: none  Patient sent to PACU in good condition  Composed by [name] MD  Dictation number: [id number]

## 2011-04-23 LAB — GLUCOSE, CAPILLARY
Glucose-Capillary: 33 mg/dL — CL (ref 70–99)
Glucose-Capillary: 77 mg/dL (ref 70–99)
Glucose-Capillary: 106 mg/dL — ABNORMAL HIGH (ref 70–99)

## 2011-04-23 MED ORDER — BACITRACIN-POLYMYXIN B 500-10000 UNIT/GM OP OINT
TOPICAL_OINTMENT | Freq: Two times a day (BID) | OPHTHALMIC | Status: DC
  Administered 2011-04-23 (×2): via OPHTHALMIC
  Filled 2011-04-23: qty 3.5

## 2011-04-23 MED ORDER — BRIMONIDINE TARTRATE 0.2 % OP SOLN: 1.0000 [drp] | Freq: Two times a day (BID) | OPHTHALMIC | Status: DC

## 2011-04-23 MED ORDER — BACITRACIN-POLYMYXIN B 500-10000 UNIT/GM OP OINT: TOPICAL_OINTMENT | Freq: Two times a day (BID) | OPHTHALMIC | Status: DC

## 2011-04-23 MED ORDER — GATIFLOXACIN 0.5 % OP SOLN: 1.0000 [drp] | Freq: Four times a day (QID) | OPHTHALMIC | Status: DC

## 2011-04-23 NOTE — Progress Notes (Signed)
04/23/2011, 6:37 AM  Mental Status:  Awake, Alert, Oriented  Anterior segment: Cornea  Clear    Anterior Chamber Clear    Lens:   Cataract  Intra Ocular Pressure 20 mmHg with Tonopen  Vitreous: Clear   Silicone oil  Retina:  Attached Good laser reaction   Impression: Excellent result Retina attached   Final Diagnosis: Active Problems:  Proliferative diabetic retinopathy, both eyes  Vitreous hemorrhage, both eyes  Retinal detachment, traction, bilateral   Plan: start post operative eye drops.  Discharge to home.  Give post operative instructions  Theresa [name] 04/23/2011, 6:37 AM

## 2011-04-23 NOTE — Op Note (Signed)
NAME:  Theresa [name], Theresa [name]                 ACCOUNT NO.:  [id number]  MEDICAL RECORD NO.:  [id number]  LOCATION:  [facility]                         FACILITY:  [facility]  PHYSICIAN:  [name]. Zigmund Daniel, M.D. DATE OF BIRTH:  Oct 27, 1955  DATE OF PROCEDURE:  04/22/2011 DATE OF DISCHARGE:                              OPERATIVE REPORT   ADMISSION DIAGNOSES:  Traction retinal detachment, proliferative diabetic retinopathy, vitreous hemorrhage; all in the left eye.  PROCEDURES:  Repair of complex traction retinal detachment in the left eye with pars plana vitrectomy, membrane peel, retinal photocoagulation, gas fluid exchange, silicone oil injection; all in the left eye.  SURGEON:  [name]. Zigmund Daniel, M.D.  ASSISTANT:  Deatra Ina, SA  ANESTHESIA:  General.  DETAILS:  Usual prep and drape, 25-gauge trocars placed at 10, 2, and 4 o'clock.  MVR incision at 12 o'clock for silicone oil.  Pars plana vitrectomy was begun just behind the cataractous lens.  Dense vitreous hemorrhage was encountered and removed in multiple layers in a core fashion.  A large table top detachment was then found across the macular region with retinal breaks on the sloping edge temporally.  The fibrosis was carefully peeled with the vitreous cutter and the vitreous pick. Diathermy was used for hemostasis.  The detachment was isolated, each area of adhesion was isolated and trimmed.  The vitrectomy was carried into the mid periphery where surface proliferation was seen.  This was carefully removed under low suction and rapid cutting removing all vitreous from the surface of the retina.  The vitrectomy was then carried into the far peripheral vitreous space down to the vitreous base with a wide field biome viewing device.  The vitreous base was trimmed and all gelatinous vitreous was removed from the surface of the vitreous base.  Endolaser was positioned in the eye, 1600 burns were placed around the retinal periphery.  The power was  1000 mW, 1000 microns each and 0.1 seconds each.  A total gas fluid exchange was performed, and re- attachment of the retina occurred with this maneuver.  Fluid was vacuumed through the retinal break and until full reattachment in the macular region was obtained.  At this point, silicone oil was then placed in the eye carefully flattening the retinal detachment.  Once the eye was totally filled with silicone oil, the instruments were removed from the eye carefully and the wounds were found to be secure and tight. The sclerotomy was closed with 9-0 nylon at 12 o'clock.  Conjunctiva was closed with wet-field cautery.  Polymyxin and gentamicin were irrigated into tenon space, atropine solution was applied.  Marcaine was injected around the globe for postop pain. Closing pressure was 10 with a Barraquer tonometer.  Complications, none.  Duration, 1 hour 15 minutes.  The patient was awakened, taken to recovery in satisfactory condition.     [name]. Zigmund Daniel, M.D.     JDM/MEDQ  D:  04/22/2011  T:  04/22/2011  Job:  [id number]

## 2011-04-23 NOTE — Discharge Summary (Signed)
Decline, not needed  OWER per Med Records

## 2011-04-25 ENCOUNTER — Encounter (HOSPITAL_COMMUNITY): Payer: Self-pay | Admitting: Ophthalmology

## 2011-04-29 ENCOUNTER — Inpatient Hospital Stay (INDEPENDENT_AMBULATORY_CARE_PROVIDER_SITE_OTHER): Payer: 59 | Admitting: Ophthalmology

## 2011-04-29 DIAGNOSIS — E1139 Type 2 diabetes mellitus with other diabetic ophthalmic complication: Secondary | ICD-10-CM

## 2011-04-29 DIAGNOSIS — E11359 Type 2 diabetes mellitus with proliferative diabetic retinopathy without macular edema: Secondary | ICD-10-CM

## 2011-04-29 LAB — LIPID PANEL
LDL Cholesterol: 138 mg/dL
Triglycerides: 328 mg/dL — AB (ref 40–160)

## 2011-04-29 LAB — HEMOGLOBIN A1C: Hgb A1c MFr Bld: 6.5 % — AB (ref 4.0–6.0)

## 2011-05-20 ENCOUNTER — Encounter (INDEPENDENT_AMBULATORY_CARE_PROVIDER_SITE_OTHER): Payer: 59 | Admitting: Ophthalmology

## 2011-05-20 DIAGNOSIS — E11359 Type 2 diabetes mellitus with proliferative diabetic retinopathy without macular edema: Secondary | ICD-10-CM

## 2011-05-21 LAB — BASIC METABOLIC PANEL: Creatinine: 2.1 mg/dL — AB (ref 0.5–1.1)

## 2011-06-05 LAB — BASIC METABOLIC PANEL
BUN: 50 mg/dL — AB (ref 4–21)
Creatinine: 2.8 mg/dL — AB (ref 0.5–1.1)
Glucose: 299 mg/dL
Sodium: 137 mmol/L (ref 137–147)

## 2011-06-13 ENCOUNTER — Encounter: Payer: Self-pay | Admitting: Cardiovascular Disease

## 2011-06-13 ENCOUNTER — Ambulatory Visit (INDEPENDENT_AMBULATORY_CARE_PROVIDER_SITE_OTHER): Payer: 59 | Admitting: Cardiovascular Disease

## 2011-06-13 DIAGNOSIS — I428 Other cardiomyopathies: Secondary | ICD-10-CM

## 2011-06-13 DIAGNOSIS — J449 Chronic obstructive pulmonary disease, unspecified: Secondary | ICD-10-CM

## 2011-06-13 DIAGNOSIS — R011 Cardiac murmur, unspecified: Secondary | ICD-10-CM

## 2011-06-13 DIAGNOSIS — R0989 Other specified symptoms and signs involving the circulatory and respiratory systems: Secondary | ICD-10-CM

## 2011-06-13 DIAGNOSIS — I519 Heart disease, unspecified: Secondary | ICD-10-CM | POA: Insufficient documentation

## 2011-06-13 DIAGNOSIS — I5189 Other ill-defined heart diseases: Secondary | ICD-10-CM | POA: Insufficient documentation

## 2011-06-13 MED ORDER — CARVEDILOL 6.25 MG PO TABS: 6.2500 mg | ORAL_TABLET | Freq: Two times a day (BID) | ORAL | Status: DC

## 2011-06-13 NOTE — Assessment & Plan Note (Signed)
She does have a very prominent bruit on the left carotid. Ultrasound has been ordered.

## 2011-06-13 NOTE — Assessment & Plan Note (Addendum)
Previous echocardiogram 6 months ago suggested ejection fraction 45-50%. She is currently asymptomatic. She reports that her diabetes is much better controlled, she is no longer smoking, she is on a cholesterol medication. She denies any symptoms of shortness of breath or chest pain. We will hold off on any stress testing at this time. EKG is essentially normal apart from LVH. We will add low dose Coreg. We will hold off on any ACE inhibitors until her renal function is known. We will defer this to Dr. Candiss Norse.

## 2011-06-13 NOTE — Progress Notes (Signed)
Patient ID: Theresa [name], female    DOB: [birth year], 56 y.o.   MRN: [id number]  HPI Comments: Theresa [name] is a pleasant 56 year old woman with 25 years of smoking, diabetes, chronic renal insufficiency, recent pneumonia in June of 2012 with admission overnight to [facility] who presents by referral from Dr. Candiss Norse (renal) for evaluation of her ejection fraction which is 45% and mitral valve disease.   She reports that currently she feels well. She did have a difficult. With her pneumonia this did improve with antibiotics. She use to have edema in her legs though this has improved on Lasix daily. She denies any shortness of breath, chest pain, lightheadedness or dizziness. Overall she feels well and has no complaints. She has been compliant with her medications.  Echocardiogram on November 10, 2010 shows mildly dilated left ventricle, ejection fraction 45-50%, mild MS, moderate to severe mitral regurg, mild to moderate tricuspid regurg.   Labs from the hospital in June showed creatinine of 1.63, increasing to 1.78  EKG shows normal sinus rhythm with rate 79 beats per minute with LVH by voltage criteria in the limb leads   Outpatient Encounter Prescriptions as of 06/13/2011  Medication Sig Dispense Refill  . acetaminophen (TYLENOL) 325 MG tablet Take 650 mg by mouth every 6 (six) hours as needed. For pain       . albuterol (PROVENTIL HFA;VENTOLIN HFA) 108 (90 BASE) MCG/ACT inhaler Inhale 2 puffs into the lungs every 6 (six) hours as needed. For shortness of breath       . beclomethasone (QVAR) 40 MCG/ACT inhaler Inhale 1 puff into the lungs daily as needed. For shortness of breath       . ferrous sulfate 325 (65 FE) MG tablet Take 325 mg by mouth 3 (three) times daily with meals.        . fluticasone (FLONASE) 50 MCG/ACT nasal spray Place 2 sprays into the nose daily.        . furosemide (LASIX) 40 MG tablet Take 40 mg by mouth daily.        Marland Kitchen gabapentin (NEURONTIN) 600 MG tablet Take 600 mg by mouth at  bedtime.        Marland Kitchen glipiZIDE (GLUCOTROL) 10 MG tablet Take 10 mg by mouth 2 (two) times daily before a meal.        . simvastatin (ZOCOR) 40 MG tablet Take 40 mg by mouth at bedtime.        Marland Kitchen VITAMIN D, CHOLECALCIFEROL, PO Take by mouth.        . bacitracin-polymyxin b (POLYSPORIN) ophthalmic ointment Place into the left eye every 12 (twelve) hours. apply to eye every 12 hours while awake  3.5 g  0    Review of Systems  Constitutional: Negative.   HENT: Negative.   Eyes: Negative.   Respiratory: Negative.   Cardiovascular: Negative.   Gastrointestinal: Negative.   Musculoskeletal: Negative.   Skin: Negative.   Neurological: Negative.   Hematological: Negative.   Psychiatric/Behavioral: Negative.   All other systems reviewed and are negative.    BP 140/78  Pulse 79  Ht 5' 7.5" (1.715 m)  Wt 175 lb (79.379 kg)  BMI 27.00 kg/m2  Physical Exam  Nursing note and vitals reviewed. Constitutional: She is oriented to person, place, and time. She appears well-developed and well-nourished.  HENT:  Head: Normocephalic.  Nose: Nose normal.  Mouth/Throat: Oropharynx is clear and moist.  Eyes: Conjunctivae are normal. Pupils are equal, round, and reactive to light.  Neck:  Normal range of motion. Neck supple. No JVD present. Carotid bruit is present.  Cardiovascular: Normal rate, regular rhythm, S1 normal, S2 normal and intact distal pulses.  Exam reveals no gallop and no friction rub.   Murmur heard.  Crescendo systolic murmur is present with a grade of 2/6  Pulmonary/Chest: Effort normal and breath sounds normal. No respiratory distress. She has no wheezes. She has no rales. She exhibits no tenderness.  Abdominal: Soft. Bowel sounds are normal. She exhibits no distension. There is no tenderness.  Musculoskeletal: Normal range of motion. She exhibits no edema and no tenderness.  Lymphadenopathy:    She has no cervical adenopathy.  Neurological: She is alert and oriented to person,  place, and time. Coordination normal.  Skin: Skin is warm and dry. No rash noted. No erythema.  Psychiatric: She has a normal mood and affect. Her behavior is normal. Judgment and thought content normal.         Assessment and Plan

## 2011-06-13 NOTE — Assessment & Plan Note (Signed)
On history of smoking for 25 years. Currently does not have significant shortness of breath and does not need inhalers.

## 2011-06-13 NOTE — Patient Instructions (Addendum)
You are doing well. Please start aspirin 81 mg daily (talk you your retina doctor first) Please start coreg 6.25 mg twice a day  We will schedule you for a carotid ultrasound for your bruit  Please call us if you have new issues that need to be addressed before your next appt.  Your physician wants you to follow-up in: 6 months.  You will receive a reminder letter in the mail two months in advance. If you don't receive a letter, please call our office to schedule the follow-up appointment.

## 2011-06-13 NOTE — Assessment & Plan Note (Signed)
Heart murmur noted. Echocardiogram did suggest mitral valve regurgitation. This may have improved with mild diuresis. Currently she is asymptomatic. Repeat echocardiogram could be ordered if she has worsening symptoms or in routine followup.

## 2011-06-30 ENCOUNTER — Encounter: Payer: Self-pay | Admitting: Internal Medicine

## 2011-06-30 ENCOUNTER — Ambulatory Visit (INDEPENDENT_AMBULATORY_CARE_PROVIDER_SITE_OTHER): Payer: 59 | Admitting: Internal Medicine

## 2011-06-30 ENCOUNTER — Ambulatory Visit (INDEPENDENT_AMBULATORY_CARE_PROVIDER_SITE_OTHER)
Admission: RE | Admit: 2011-06-30 | Discharge: 2011-06-30 | Disposition: A | Discharge status: Home / Self Care | Payer: 59 | Source: Ambulatory Visit | Attending: Internal Medicine | Admitting: Internal Medicine

## 2011-06-30 VITALS — BP 122/60 | HR 75 | Temp 98.1°F | Ht 67.0 in | Wt 175.0 lb

## 2011-06-30 DIAGNOSIS — N058 Unspecified nephritic syndrome with other morphologic changes: Secondary | ICD-10-CM

## 2011-06-30 DIAGNOSIS — N1832 Chronic kidney disease, stage 3b: Secondary | ICD-10-CM | POA: Insufficient documentation

## 2011-06-30 DIAGNOSIS — E119 Type 2 diabetes mellitus without complications: Secondary | ICD-10-CM | POA: Insufficient documentation

## 2011-06-30 DIAGNOSIS — N183 Chronic kidney disease, stage 3 unspecified: Secondary | ICD-10-CM

## 2011-06-30 DIAGNOSIS — M5431 Sciatica, right side: Secondary | ICD-10-CM

## 2011-06-30 DIAGNOSIS — E1121 Type 2 diabetes mellitus with diabetic nephropathy: Secondary | ICD-10-CM

## 2011-06-30 DIAGNOSIS — M5430 Sciatica, unspecified side: Secondary | ICD-10-CM

## 2011-06-30 DIAGNOSIS — E1129 Type 2 diabetes mellitus with other diabetic kidney complication: Secondary | ICD-10-CM

## 2011-06-30 DIAGNOSIS — I152 Hypertension secondary to endocrine disorders: Secondary | ICD-10-CM

## 2011-06-30 DIAGNOSIS — E1169 Type 2 diabetes mellitus with other specified complication: Secondary | ICD-10-CM

## 2011-06-30 DIAGNOSIS — Z1239 Encounter for other screening for malignant neoplasm of breast: Secondary | ICD-10-CM

## 2011-06-30 DIAGNOSIS — I10 Essential (primary) hypertension: Secondary | ICD-10-CM

## 2011-06-30 DIAGNOSIS — E1159 Type 2 diabetes mellitus with other circulatory complications: Secondary | ICD-10-CM | POA: Insufficient documentation

## 2011-06-30 NOTE — Assessment & Plan Note (Signed)
Will get recent lab records as to her last hemoglobin A1c. We'll continue current medications.

## 2011-06-30 NOTE — Assessment & Plan Note (Signed)
Will get records from her nephrologist.

## 2011-06-30 NOTE — Assessment & Plan Note (Signed)
Blood pressure well-controlled today. Will get records on recent renal function. We'll continue current medications. Patient will followup in one month.

## 2011-06-30 NOTE — Assessment & Plan Note (Signed)
Exam is remarkable for tenderness over the posterior right hip. Will get plain x-rays for evaluation. Symptoms are most consistent with sciatic pain. Will set patient up with physical therapy. We also discussed intervention including steroid injection if no improvement with PT. Patient will followup in one month. Patient will continue to use Tylenol and Neurontin in the interim. If her pain is severe or worsening, she will call prior to her next appointment.

## 2011-06-30 NOTE — Progress Notes (Addendum)
Subjective:    Patient ID: Theresa [name], female    DOB: 1956/05/11, 56 y.o.   MRN: [id number]  HPI 56 year old female with a history of diabetes, hypertension, hyperlipidemia, chronic kidney disease presents to establish care. In regards to her diabetes, she reports good control of her blood sugars. She denies any sugars greater than 250 or below 60. She reports full compliance with her medications. She notes that she was recently diagnosed with chronic kidney disease and is now followed by a nephrologist. She reports having recent labs performed but does not know the results of this testing. She also does not know the results of her recent hemoglobin A1c.  She is primarily concerned today about right hip pain over the last several weeks. This is described as sharp pain in the lower back and posterior hip which radiates down the back of her right thigh. She has been taking Neurontin and Tylenol as needed for this with minimal improvement. She denies any weakness in her leg. She denies any known injury to her hip or any falls.  Outpatient Encounter Prescriptions as of 06/30/2011  Medication Sig Dispense Refill  . acetaminophen (TYLENOL) 325 MG tablet Take 650 mg by mouth every 6 (six) hours as needed. For pain       . albuterol (PROVENTIL HFA;VENTOLIN HFA) 108 (90 BASE) MCG/ACT inhaler Inhale 2 puffs into the lungs every 6 (six) hours as needed. For shortness of breath       . beclomethasone (QVAR) 40 MCG/ACT inhaler Inhale 1 puff into the lungs daily as needed. For shortness of breath       . carvedilol (COREG) 6.25 MG tablet Take 1 tablet (6.25 mg total) by mouth 2 (two) times daily.  60 tablet  11  . ferrous sulfate 325 (65 FE) MG tablet Take 325 mg by mouth 3 (three) times daily with meals.        . fluticasone (FLONASE) 50 MCG/ACT nasal spray Place 2 sprays into the nose daily.        . furosemide (LASIX) 40 MG tablet Take 40 mg by mouth daily.        Marland Kitchen gabapentin (NEURONTIN) 600 MG tablet Take  600 mg by mouth at bedtime.        Marland Kitchen glipiZIDE (GLUCOTROL) 10 MG tablet Take 10 mg by mouth 2 (two) times daily before a meal.        . simvastatin (ZOCOR) 40 MG tablet Take 40 mg by mouth at bedtime.        . bacitracin-polymyxin b (POLYSPORIN) ophthalmic ointment Place into the left eye every 12 (twelve) hours. apply to eye every 12 hours while awake  3.5 g  0  . DISCONTD: VITAMIN D, CHOLECALCIFEROL, PO Take by mouth.          Review of Systems  Constitutional: Negative for fever, chills, appetite change, fatigue and unexpected weight change.  HENT: Negative for ear pain, congestion, sore throat, trouble swallowing, neck pain, voice change and sinus pressure.   Eyes: Negative for visual disturbance.  Respiratory: Negative for cough, shortness of breath, wheezing and stridor.   Cardiovascular: Negative for chest pain, palpitations and leg swelling.  Gastrointestinal: Negative for nausea, vomiting, abdominal pain, diarrhea, constipation, blood in stool, abdominal distention and anal bleeding.  Genitourinary: Negative for dysuria and flank pain.  Musculoskeletal: Positive for myalgias, back pain and arthralgias. Negative for joint swelling and gait problem.  Skin: Negative for color change and rash.  Neurological: Negative for dizziness and headaches.  Hematological: Negative for adenopathy. Does not bruise/bleed easily.  Psychiatric/Behavioral: Negative for suicidal ideas, sleep disturbance and dysphoric mood. The patient is not nervous/anxious.    BP 122/60  Pulse 75  Temp(Src) 98.1 F (36.7 C) (Oral)  Ht 5\' 7"  (1.702 m)  Wt 175 lb (79.379 kg)  BMI 27.41 kg/m2  SpO2 96%     Objective:   Physical Exam  Constitutional: She is oriented to person, place, and time. She appears well-developed and well-nourished. No distress.  HENT:  Head: Normocephalic and atraumatic.  Right Ear: External ear normal.  Left Ear: External ear normal.  Nose: Nose normal.  Mouth/Throat: Oropharynx is  clear and moist. No oropharyngeal exudate.  Eyes: Conjunctivae are normal. Pupils are equal, round, and reactive to light. Right eye exhibits no discharge. Left eye exhibits no discharge. No scleral icterus.  Neck: Normal range of motion. Neck supple. Carotid bruit is present (left). No tracheal deviation present. No thyromegaly present.  Cardiovascular: Normal rate, regular rhythm and intact distal pulses.  Exam reveals no gallop and no friction rub.   Murmur heard.  Systolic murmur is present  Pulmonary/Chest: Effort normal and breath sounds normal. No respiratory distress. She has no wheezes. She has no rales. She exhibits no tenderness.  Musculoskeletal: Normal range of motion. She exhibits no edema and no tenderness.       Lumbar back: She exhibits tenderness. She exhibits normal range of motion.       Back:  Lymphadenopathy:    She has no cervical adenopathy.  Neurological: She is alert and oriented to person, place, and time. No cranial nerve deficit. She exhibits normal muscle tone. Coordination normal.  Skin: Skin is warm and dry. No rash noted. She is not diaphoretic. No erythema. No pallor.  Psychiatric: She has a normal mood and affect. Her behavior is normal. Judgment and thought content normal.          Assessment & Plan:

## 2011-07-01 ENCOUNTER — Encounter: Payer: 59 | Admitting: *Deleted

## 2011-07-01 ENCOUNTER — Encounter (INDEPENDENT_AMBULATORY_CARE_PROVIDER_SITE_OTHER): Payer: 59 | Admitting: *Deleted

## 2011-07-01 DIAGNOSIS — I6529 Occlusion and stenosis of unspecified carotid artery: Secondary | ICD-10-CM

## 2011-07-01 DIAGNOSIS — R0989 Other specified symptoms and signs involving the circulatory and respiratory systems: Secondary | ICD-10-CM

## 2011-07-16 ENCOUNTER — Ambulatory Visit: Payer: Self-pay | Admitting: Vascular Surgery

## 2011-07-16 LAB — BASIC METABOLIC PANEL
Anion Gap: 12 (ref 7–16)
Calcium, Total: 8.8 mg/dL (ref 8.5–10.1)
Chloride: 99 mmol/L (ref 98–107)
Co2: 25 mmol/L (ref 21–32)
Creatinine: 3.2 mg/dL — ABNORMAL HIGH (ref 0.60–1.30)
Osmolality: 299 (ref 275–301)

## 2011-07-16 LAB — CBC
HGB: 9.2 g/dL — ABNORMAL LOW (ref 12.0–16.0)
MCHC: 33.8 g/dL (ref 32.0–36.0)
Platelet: 211 10*3/uL (ref 150–440)
RDW: 12.7 % (ref 11.5–14.5)
WBC: 9.6 10*3/uL (ref 3.6–11.0)

## 2011-07-23 ENCOUNTER — Ambulatory Visit: Payer: Self-pay | Admitting: Vascular Surgery

## 2011-07-25 ENCOUNTER — Encounter (INDEPENDENT_AMBULATORY_CARE_PROVIDER_SITE_OTHER): Payer: 59 | Admitting: Ophthalmology

## 2011-07-29 ENCOUNTER — Encounter (INDEPENDENT_AMBULATORY_CARE_PROVIDER_SITE_OTHER): Payer: 59 | Admitting: Ophthalmology

## 2011-07-29 DIAGNOSIS — H3340 Traction detachment of retina, unspecified eye: Secondary | ICD-10-CM

## 2011-07-29 DIAGNOSIS — E1139 Type 2 diabetes mellitus with other diabetic ophthalmic complication: Secondary | ICD-10-CM

## 2011-07-29 DIAGNOSIS — H43819 Vitreous degeneration, unspecified eye: Secondary | ICD-10-CM

## 2011-07-29 DIAGNOSIS — H4310 Vitreous hemorrhage, unspecified eye: Secondary | ICD-10-CM

## 2011-07-29 DIAGNOSIS — E1165 Type 2 diabetes mellitus with hyperglycemia: Secondary | ICD-10-CM

## 2011-07-29 DIAGNOSIS — E11359 Type 2 diabetes mellitus with proliferative diabetic retinopathy without macular edema: Secondary | ICD-10-CM

## 2011-07-29 NOTE — H&P (Signed)
Theresa [name] is an 56 y.o. female.   Chief Complaint:Loss of vision right eye HPI: Severe traction retinal detachment right eye  Past Medical History  Diagnosis Date  . Hypertension   . Hyperlipidemia   . COPD (chronic obstructive pulmonary disease)   . Emphysema   . Shortness of breath     more with exertion  . Pneumonia     May 2012  . Bronchitis     Mar 2012  . Peripheral vascular disease   . Varicose vein   . Edema extremities     legs occasionally  . Chronic constipation   . Colon polyps   . Sickle cell trait   . Anemia   . Diabetes mellitus   . Heart murmur     Past Surgical History  Procedure Date  . Abdominal hysterectomy     2000  . Dilation and curettage of uterus     several in the early 80's  . Eye surgery     bilateral laser 2012  . Esophagogastroduodenoscopy     2012  . Colonscopy   . Tubal ligation     1979  . Pars plana vitrectomy 04/22/2011    Procedure: PARS PLANA VITRECTOMY WITH 25 GAUGE;  Surgeon: [name], MD;  Location: [facility];  Service: Ophthalmology;  Laterality: Left;  membrane peel, endolaser, gas fluid exchange, silicone oil, repair of complex traction retinal detachment    Family History  Problem Relation Age of Onset  . Anesthesia problems Neg Hx   . Hypotension Neg Hx   . Malignant hyperthermia Neg Hx   . Pseudochol deficiency Neg Hx   . Stroke Mother   . Heart attack Mother   . Heart disease Mother   . Colon cancer Father   . Colon cancer Sister   . Heart attack Brother   . Stroke Brother   . Breast cancer Sister    Social History:  reports that she quit smoking about 14 months ago. She does not have any smokeless tobacco history on file. She reports that she does not drink alcohol or use illicit drugs.  Allergies: No Known Allergies  No current facility-administered medications on file as of .   Medications Prior to Admission  Medication Sig Dispense Refill  . acetaminophen (TYLENOL) 325 MG tablet Take 650 mg by  mouth every 6 (six) hours as needed. For pain       . albuterol (PROVENTIL HFA;VENTOLIN HFA) 108 (90 BASE) MCG/ACT inhaler Inhale 2 puffs into the lungs every 6 (six) hours as needed. For shortness of breath       . bacitracin-polymyxin b (POLYSPORIN) ophthalmic ointment Place into the left eye every 12 (twelve) hours. apply to eye every 12 hours while awake  3.5 g  0  . beclomethasone (QVAR) 40 MCG/ACT inhaler Inhale 1 puff into the lungs daily as needed. For shortness of breath       . carvedilol (COREG) 6.25 MG tablet Take 1 tablet (6.25 mg total) by mouth 2 (two) times daily.  60 tablet  11  . ferrous sulfate 325 (65 FE) MG tablet Take 325 mg by mouth 3 (three) times daily with meals.        . fluticasone (FLONASE) 50 MCG/ACT nasal spray Place 2 sprays into the nose daily.        . furosemide (LASIX) 40 MG tablet Take 40 mg by mouth daily.        Marland Kitchen gabapentin (NEURONTIN) 600 MG tablet Take 600  mg by mouth at bedtime.        Marland Kitchen glipiZIDE (GLUCOTROL) 10 MG tablet Take 10 mg by mouth 2 (two) times daily before a meal.        . simvastatin (ZOCOR) 40 MG tablet Take 40 mg by mouth at bedtime.          Review of systems otherwise negative  There were no vitals taken for this visit.  Physical exam: Mental status: oriented x3. Eyes: See eye exam associated with this date and this surgery scanned in by scanning center Ears, Nose, Throat: within normal limits Neck: Within Normal limits General: within normal limits Chest: Within normal limits Breast: deferred Heart: Within normal limits Abdomen: Within normal limits GU: deferred Extremities: within normal limits Skin: within normal limits  Assessment/Plan Traction retinal detachment and proliferative diabetic retinopathy right eye Plan: To [facility] for pars plana vitrectomy with membrane peel, laser, gas injection and possible silicone oil injection right eye  [name] 07/29/2011, 5:36 PM

## 2011-07-31 ENCOUNTER — Encounter: Payer: Self-pay | Admitting: Internal Medicine

## 2011-07-31 ENCOUNTER — Ambulatory Visit (INDEPENDENT_AMBULATORY_CARE_PROVIDER_SITE_OTHER): Payer: 59 | Admitting: Internal Medicine

## 2011-07-31 DIAGNOSIS — E1121 Type 2 diabetes mellitus with diabetic nephropathy: Secondary | ICD-10-CM

## 2011-07-31 DIAGNOSIS — N058 Unspecified nephritic syndrome with other morphologic changes: Secondary | ICD-10-CM

## 2011-07-31 DIAGNOSIS — E1129 Type 2 diabetes mellitus with other diabetic kidney complication: Secondary | ICD-10-CM

## 2011-07-31 DIAGNOSIS — N183 Chronic kidney disease, stage 3 unspecified: Secondary | ICD-10-CM

## 2011-07-31 NOTE — Assessment & Plan Note (Signed)
Patient recently had vascular procedure in preparation for fistula. She reports there was some difficulty with her pain. Will request records on this from her vascular surgeon. We'll also request recent electrolytes and labs from her nephrologist.

## 2011-07-31 NOTE — Progress Notes (Signed)
Subjective:    Patient ID: Theresa [name], female    DOB: 1955-07-11, 56 y.o.   MRN: [id number]  HPI  56 year old female with history of diabetes , chronic kidney disease stage IV presents for followup. She notes that she recently had a vascular procedure in which surgeon attempted to make a fistula for dialysis in her left arm. She notes there was a problem with her vein in the were not able to make the fistula. She is unsure of the exact details. She reports that the scar underwent arm is healing well. She denies any fever or chills. There is minimal redness at the site.   In regards to diabetes, she notes that her blood sugars have been slightly elevated since stopping insulin. She notes most blood sugars near 150, however she occasionally has sugars as high as 269. She has been taking glipizide once daily. She is interested in trying back on glipizide twice daily. She denies any low blood sugars.  She notes that she is scheduled for upcoming eye surgery. She requests preoperative clearance. She notes that she has never had trouble with anesthesia. She has never had problems with easy bleeding or easy bruising. She reports that recent labs performed by her nephrologist including kidney function and electrolytes have been stable. She denies any recent illnesses.  Outpatient Encounter Prescriptions as of 07/31/2011  Medication Sig Dispense Refill  . acetaminophen (TYLENOL) 325 MG tablet Take 650 mg by mouth every 6 (six) hours as needed. For pain       . albuterol (PROVENTIL HFA;VENTOLIN HFA) 108 (90 BASE) MCG/ACT inhaler Inhale 2 puffs into the lungs every 6 (six) hours as needed. For shortness of breath       . beclomethasone (QVAR) 40 MCG/ACT inhaler Inhale 1 puff into the lungs daily as needed. For shortness of breath       . carvedilol (COREG) 6.25 MG tablet Take 1 tablet (6.25 mg total) by mouth 2 (two) times daily.  60 tablet  11  . fluticasone (FLONASE) 50 MCG/ACT nasal spray Place 2 sprays  into the nose daily.        . furosemide (LASIX) 40 MG tablet Take 40 mg by mouth daily.        Marland Kitchen gabapentin (NEURONTIN) 600 MG tablet Take 600 mg by mouth at bedtime.        Marland Kitchen glipiZIDE (GLUCOTROL) 10 MG tablet Take 10 mg by mouth 2 (two) times daily before a meal.        . losartan (COZAAR) 25 MG tablet Take 25 mg by mouth daily.      . simvastatin (ZOCOR) 40 MG tablet Take 40 mg by mouth at bedtime.        Marland Kitchen DISCONTD: bacitracin-polymyxin b (POLYSPORIN) ophthalmic ointment Place into the left eye every 12 (twelve) hours. apply to eye every 12 hours while awake  3.5 g  0  . DISCONTD: ferrous sulfate 325 (65 FE) MG tablet Take 325 mg by mouth 3 (three) times daily with meals.          Review of Systems  Constitutional: Negative for fever, chills, appetite change, fatigue and unexpected weight change.  HENT: Negative for ear pain, congestion, sore throat, trouble swallowing, neck pain, voice change and sinus pressure.   Eyes: Negative for visual disturbance.  Respiratory: Negative for cough, shortness of breath, wheezing and stridor.   Cardiovascular: Negative for chest pain, palpitations and leg swelling.  Gastrointestinal: Negative for nausea, vomiting, abdominal pain, diarrhea, constipation, blood  in stool, abdominal distention and anal bleeding.  Genitourinary: Negative for dysuria and flank pain.  Musculoskeletal: Negative for myalgias, arthralgias and gait problem.  Skin: Negative for color change and rash.  Neurological: Negative for dizziness and headaches.  Hematological: Negative for adenopathy. Does not bruise/bleed easily.  Psychiatric/Behavioral: Negative for suicidal ideas, sleep disturbance and dysphoric mood. The patient is not nervous/anxious.    BP 110/50  Pulse 100  Temp(Src) 98 F (36.7 C) (Oral)  Ht 5' 7.5" (1.715 m)  Wt 175 lb (79.379 kg)  BMI 27.00 kg/m2  SpO2 97%     Objective:   Physical Exam  Constitutional: She is oriented to person, place, and time. She  appears well-developed and well-nourished. No distress.  HENT:  Head: Normocephalic and atraumatic.  Right Ear: External ear normal.  Left Ear: External ear normal.  Nose: Nose normal.  Mouth/Throat: Oropharynx is clear and moist. No oropharyngeal exudate.  Eyes: Conjunctivae are normal. Pupils are equal, round, and reactive to light. Right eye exhibits no discharge. Left eye exhibits no discharge. No scleral icterus.  Neck: Normal range of motion. Neck supple. No tracheal deviation present. No thyromegaly present.  Cardiovascular: Normal rate, regular rhythm, normal heart sounds and intact distal pulses.  Exam reveals no gallop and no friction rub.   No murmur heard. Pulmonary/Chest: Effort normal and breath sounds normal. No respiratory distress. She has no wheezes. She has no rales. She exhibits no tenderness.  Musculoskeletal: Normal range of motion. She exhibits no edema and no tenderness.  Lymphadenopathy:    She has no cervical adenopathy.  Neurological: She is alert and oriented to person, place, and time. No cranial nerve deficit. She exhibits normal muscle tone. Coordination normal.  Skin: Skin is warm and dry. No rash noted. She is not diaphoretic. No erythema. No pallor.     Psychiatric: She has a normal mood and affect. Her behavior is normal. Judgment and thought content normal.          Assessment & Plan:

## 2011-07-31 NOTE — Assessment & Plan Note (Signed)
Recent hemoglobin A1c 7.9%. Blood sugars have been increasing since stopping insulin. Will try increasing glipizide to twice daily. Patient will call if any blood sugars above 200. If persistently elevated, will add back low-dose insulin. Follow up 1 month

## 2011-08-01 ENCOUNTER — Ambulatory Visit: Payer: 59 | Admitting: Internal Medicine

## 2011-08-01 ENCOUNTER — Encounter: Payer: Self-pay | Admitting: Internal Medicine

## 2011-08-13 ENCOUNTER — Encounter (HOSPITAL_COMMUNITY): Payer: Self-pay | Admitting: Pharmacy Technician

## 2011-08-19 ENCOUNTER — Inpatient Hospital Stay (HOSPITAL_COMMUNITY): Admission: RE | Admit: 2011-08-19 | Discharge: 2011-08-19 | Payer: 59 | Source: Ambulatory Visit

## 2011-08-21 ENCOUNTER — Telehealth: Payer: Self-pay | Admitting: Internal Medicine

## 2011-08-21 ENCOUNTER — Emergency Department: Payer: Self-pay | Admitting: Internal Medicine

## 2011-08-21 ENCOUNTER — Encounter: Payer: Self-pay | Admitting: Internal Medicine

## 2011-08-21 ENCOUNTER — Ambulatory Visit (INDEPENDENT_AMBULATORY_CARE_PROVIDER_SITE_OTHER): Payer: 59 | Admitting: Internal Medicine

## 2011-08-21 VITALS — BP 100/60 | HR 83 | Temp 98.6°F | Ht 67.5 in | Wt 172.0 lb

## 2011-08-21 DIAGNOSIS — Z992 Dependence on renal dialysis: Secondary | ICD-10-CM | POA: Insufficient documentation

## 2011-08-21 DIAGNOSIS — N186 End stage renal disease: Secondary | ICD-10-CM | POA: Insufficient documentation

## 2011-08-21 DIAGNOSIS — N184 Chronic kidney disease, stage 4 (severe): Secondary | ICD-10-CM

## 2011-08-21 DIAGNOSIS — I951 Orthostatic hypotension: Secondary | ICD-10-CM | POA: Insufficient documentation

## 2011-08-21 LAB — COMPREHENSIVE METABOLIC PANEL
Anion Gap: 13 (ref 7–16)
BUN: 46 mg/dL — ABNORMAL HIGH (ref 7–18)
Calcium, Total: 8.9 mg/dL (ref 8.5–10.1)
Chloride: 101 mmol/L (ref 98–107)
Co2: 22 mmol/L (ref 21–32)
EGFR (African American): 18 — ABNORMAL LOW
EGFR (Non-African Amer.): 15 — ABNORMAL LOW
Glucose: 163 mg/dL — ABNORMAL HIGH (ref 65–99)
Osmolality: 287 (ref 275–301)
Potassium: 4.8 mmol/L (ref 3.5–5.1)
SGOT(AST): 25 U/L (ref 15–37)
Sodium: 136 mmol/L (ref 136–145)

## 2011-08-21 LAB — URINALYSIS, COMPLETE
Glucose,UR: 50 mg/dL (ref 0–75)
Hyaline Cast: 9
Leukocyte Esterase: NEGATIVE
Nitrite: NEGATIVE
Ph: 5 (ref 4.5–8.0)
Protein: 100
RBC,UR: 1 /HPF (ref 0–5)
Specific Gravity: 1.011 (ref 1.003–1.030)

## 2011-08-21 LAB — HM COLONOSCOPY: HM Colonoscopy: NORMAL

## 2011-08-21 LAB — CBC
HCT: 29.3 % — ABNORMAL LOW (ref 35.0–47.0)
MCH: 32.5 pg (ref 26.0–34.0)
MCHC: 33.7 g/dL (ref 32.0–36.0)
MCV: 96 fL (ref 80–100)
Platelet: 227 10*3/uL (ref 150–440)
RBC: 3.04 10*6/uL — ABNORMAL LOW (ref 3.80–5.20)
WBC: 8.3 10*3/uL (ref 3.6–11.0)

## 2011-08-21 NOTE — Telephone Encounter (Signed)
[facility] Triage Call Report Triage Record Num: [id number] Operator: Soledad Gerlach Patient Name: Theresa [name] Call Date & Time: 08/21/2011 8:53:50AM Patient Phone: [phone] PCP: Patient Gender: Female PCP Fax : Patient DOB: 1956/02/20 Practice Name: [facility] Reason for Call: Caller: [place]/Patient; PCP: Ronette Deter; CB#: [phone]; ; ; Call regarding Diarrhea THE PATIENT REFUSED 911; onset 08/17/11 and continues x 4 days. c/o dizziness and weakness. Afebrile. Per protocol, ED disposition; patient prefers to be seen in office. Appt sched 1000 08/21/11 with Dr. Gilford Rile. Protocol(s) Used: Diarrhea or Other Change in Bowel Habits Recommended Outcome per Protocol: See ED Immediately Override Outcome if Used in Protocol: See Provider Immediately RN Reason for Override Outcome: Office Is Open. Reason for Outcome: Signs of dehydration Care Advice: ~ Protect the patient from falling or other harm. ~ Another adult should drive. Call EMS 911 if signs and symptoms of shock develop (such as unable to stand due to faintness, dizziness, or lightheadedness; new onset of confusion; slow to respond or difficult to awaken; skin is pale, gray, cool, or moist to touch; severe weakness; loss of consciousness). ~ Change position slowly. Sit for a couple of minutes before standing to walk. Quick position changes may cause or worsen symptoms. ~ ~ IMMEDIATE ACTION Write down provider's name. List or place the following in a bag for transport with the patient: current prescription and/or nonprescription medications; alternative treatments, therapies and medications; and street drugs. ~ May have clear liquids (such as water, clear fruit juices without pulp, soda, tea or coffee without dairy or non-dairy creamer, clear broth or bouillon, oral hydration solution, or plain gelatin, fruit ices/popsicles, hard candy) but do not eat solid foods before being seen by your  provider. ~ 08/21/2011 9:01:21AM Page 1 of 1 [id number]

## 2011-08-21 NOTE — Assessment & Plan Note (Signed)
Discussed with patient's nephrologist and expressed my concerns about dehydration and need for hospital admission and IV fluids. Patient will be admitted to [facility] for further care.

## 2011-08-21 NOTE — Telephone Encounter (Signed)
Pt seen today and sent to ED by MD

## 2011-08-21 NOTE — Progress Notes (Signed)
Subjective:    Patient ID: Theresa [name], female    DOB: 09/30/1955, 56 y.o.   MRN: [id number]  HPI 56 year old female with history of chronic kidney disease stage IV presents for an acute visit complaining of several days of general malaise, nausea, and watery diarrhea. She reports that she is lightheaded with standing. She denies any fever or chills. She denies abdominal pain. She notes that she has had some cough which is nonproductive. She denies any known sick contacts.  Outpatient Encounter Prescriptions as of 08/21/2011  Medication Sig Dispense Refill  . acetaminophen (TYLENOL) 325 MG tablet Take 650 mg by mouth every 6 (six) hours as needed. For pain       . albuterol (PROVENTIL HFA;VENTOLIN HFA) 108 (90 BASE) MCG/ACT inhaler Inhale 2 puffs into the lungs every 6 (six) hours as needed. For shortness of breath       . beclomethasone (QVAR) 40 MCG/ACT inhaler Inhale 1 puff into the lungs daily as needed. For shortness of breath       . carvedilol (COREG) 6.25 MG tablet Take 1 tablet (6.25 mg total) by mouth 2 (two) times daily.  60 tablet  11  . fluticasone (FLONASE) 50 MCG/ACT nasal spray Place 2 sprays into the nose daily.        . furosemide (LASIX) 40 MG tablet Take 40 mg by mouth daily.        Marland Kitchen gabapentin (NEURONTIN) 600 MG tablet Take 600 mg by mouth at bedtime.        Marland Kitchen glipiZIDE (GLUCOTROL) 10 MG tablet Take 10 mg by mouth 2 (two) times daily before a meal.        . losartan (COZAAR) 25 MG tablet Take 25 mg by mouth daily.      . simvastatin (ZOCOR) 40 MG tablet Take 40 mg by mouth at bedtime.          Review of Systems  Constitutional: Positive for fatigue. Negative for fever and chills.  Respiratory: Positive for cough. Negative for shortness of breath.   Cardiovascular: Negative for chest pain and leg swelling.  Gastrointestinal: Positive for nausea and diarrhea. Negative for vomiting and abdominal pain.  Neurological: Positive for light-headedness.   BP 100/60  Pulse 83   Temp(Src) 98.6 F (37 C) (Oral)  Ht 5' 7.5" (1.715 m)  Wt 172 lb (78.019 kg)  BMI 26.54 kg/m2  SpO2 99%     Objective:   Physical Exam  Constitutional: She is oriented to person, place, and time. She appears well-developed and well-nourished. She appears lethargic. She has a sickly appearance. She appears ill. She appears distressed.  HENT:  Head: Normocephalic and atraumatic.  Right Ear: External ear normal.  Left Ear: External ear normal.  Nose: Nose normal.  Mouth/Throat: Oropharynx is clear and moist. No oropharyngeal exudate.  Eyes: Conjunctivae are normal. Pupils are equal, round, and reactive to light. Right eye exhibits no discharge. Left eye exhibits no discharge. No scleral icterus.  Neck: Normal range of motion. Neck supple. No tracheal deviation present. No thyromegaly present.  Cardiovascular: Normal rate, regular rhythm, normal heart sounds and intact distal pulses.  Exam reveals no gallop and no friction rub.   No murmur heard. Pulmonary/Chest: Effort normal and breath sounds normal. No respiratory distress. She has no wheezes. She has no rales. She exhibits no tenderness.  Abdominal: Soft. Normal appearance. There is no tenderness.  Musculoskeletal: Normal range of motion. She exhibits no edema and no tenderness.  Lymphadenopathy:  She has no cervical adenopathy.  Neurological: She is oriented to person, place, and time. She appears lethargic. No cranial nerve deficit. She exhibits normal muscle tone. Coordination normal.  Skin: Skin is warm and dry. No rash noted. She is not diaphoretic. No erythema. No pallor.  Psychiatric: She has a normal mood and affect. Her behavior is normal. Judgment and thought content normal.          Assessment & Plan:

## 2011-08-21 NOTE — Assessment & Plan Note (Signed)
Secondary to diarrhea, poor po intake. Given CKD4, will send to ED for admission for IVF hydration.

## 2011-08-22 ENCOUNTER — Telehealth: Payer: Self-pay | Admitting: Internal Medicine

## 2011-08-22 ENCOUNTER — Inpatient Hospital Stay (HOSPITAL_COMMUNITY): Admission: RE | Admit: 2011-08-22 | Payer: 59 | Source: Ambulatory Visit

## 2011-08-22 NOTE — Telephone Encounter (Signed)
I called the patient back and instructed her as Dr. Gilford Rile had noted . Not to go forward with the pre-admission testing and she needs to be seen prior to surgery . The records from the ED visit have been requested.

## 2011-08-22 NOTE — Telephone Encounter (Signed)
Should she go ahead with her surgery . Her pre-admission is today at 2:00. Please consult her before 12:00.

## 2011-08-22 NOTE — Telephone Encounter (Signed)
I put pt on the schedule for wed and left her a detailed VM w/time and date.

## 2011-08-22 NOTE — Telephone Encounter (Signed)
Patient appt. was canceled for her pre-admission testing and Surgery per Dr. Gilford Rile.Patient is needing to be re-evaluated in the office prior to surgery  due to her recent ED visit after being seen in Dr. Derry Skill office.

## 2011-08-22 NOTE — Telephone Encounter (Signed)
NO.  She needs to be seen prior to surgery. We need records from [facility].

## 2011-08-25 MED ORDER — CLINDAMYCIN PHOSPHATE 600 MG/50ML IV SOLN
600.0000 mg | INTRAVENOUS | Status: DC | PRN
  Filled 2011-08-25: qty 50

## 2011-08-25 NOTE — Progress Notes (Signed)
Spoke with pt who confirmed that she is not planning to come for surgery tomorrow, says she spoke with Lattie Haw in Dr. Zigmund Daniel' office and told them as well.

## 2011-08-25 NOTE — Progress Notes (Addendum)
Spoke with Dr. Thomes Dinning office. She is scheduled to see pt on Wednesday for follow-up from ER visit on Friday. Per office Dr. Gilford Rile does not want pt to have surgery until after she has been reevaluated in the office.  Spoke with Bethena Roys in Dr. Zigmund Daniel' office and provided her with above information.

## 2011-08-26 ENCOUNTER — Ambulatory Visit (HOSPITAL_COMMUNITY): Admission: RE | Admit: 2011-08-26 | Payer: 59 | Source: Ambulatory Visit | Admitting: Ophthalmology

## 2011-08-26 ENCOUNTER — Encounter (HOSPITAL_COMMUNITY): Admission: RE | Payer: Self-pay | Source: Ambulatory Visit

## 2011-08-26 SURGERY — PARS PLANA VITRECTOMY WITH 25 GAUGE
Anesthesia: General | Laterality: Right

## 2011-08-27 ENCOUNTER — Ambulatory Visit (INDEPENDENT_AMBULATORY_CARE_PROVIDER_SITE_OTHER): Payer: 59 | Admitting: Internal Medicine

## 2011-08-27 ENCOUNTER — Encounter: Payer: Self-pay | Admitting: Internal Medicine

## 2011-08-27 VITALS — BP 119/70 | HR 82 | Temp 98.2°F | Ht 67.5 in | Wt 178.0 lb

## 2011-08-27 DIAGNOSIS — E1121 Type 2 diabetes mellitus with diabetic nephropathy: Secondary | ICD-10-CM

## 2011-08-27 DIAGNOSIS — E1129 Type 2 diabetes mellitus with other diabetic kidney complication: Secondary | ICD-10-CM

## 2011-08-27 DIAGNOSIS — N184 Chronic kidney disease, stage 4 (severe): Secondary | ICD-10-CM

## 2011-08-27 DIAGNOSIS — N058 Unspecified nephritic syndrome with other morphologic changes: Secondary | ICD-10-CM

## 2011-08-27 NOTE — Assessment & Plan Note (Signed)
Blood sugars have been fairly well controlled on glipizide alone. Will continue to monitor closely. Will repeat hemoglobin A1c in May 2013. She will call sooner if blood sugars consistently greater than 200.

## 2011-08-27 NOTE — Progress Notes (Signed)
Subjective:    Patient ID: Theresa [name], female    DOB: 12/07/1955, 56 y.o.   MRN: [id number]  HPI 56 year old female with history of chronic kidney disease and DM presents for followup after recent ER visit for dehydration secondary to diarrhea. She reports that she is gradually improving. Her energy level has nearly returned to baseline. She continues to have some queasiness or nausea, but prefers not to use any medication for this. She has increased her fluid intake. She reports full compliance with her medications. She denies any further diarrhea, vomiting, fever, chills.  In regards to diabetes, she notes that her blood sugars have been slightly higher since starting carvedilol. She notes that low sugars are between 150 and 200. She denies any blood sugars over 250. She reports full compliance with her glipizide.  In regards to her upcoming surgery, she notes that she has never had difficulty with anesthesia. She has no history of easy bleeding or bruising.  Outpatient Encounter Prescriptions as of 08/27/2011  Medication Sig Dispense Refill  . acetaminophen (TYLENOL) 325 MG tablet Take 650 mg by mouth every 6 (six) hours as needed. For pain       . albuterol (PROVENTIL HFA;VENTOLIN HFA) 108 (90 BASE) MCG/ACT inhaler Inhale 2 puffs into the lungs every 6 (six) hours as needed. For shortness of breath       . beclomethasone (QVAR) 40 MCG/ACT inhaler Inhale 1 puff into the lungs daily as needed. For shortness of breath       . carvedilol (COREG) 6.25 MG tablet Take 1 tablet (6.25 mg total) by mouth 2 (two) times daily.  60 tablet  11  . fluticasone (FLONASE) 50 MCG/ACT nasal spray Place 2 sprays into the nose daily.        . furosemide (LASIX) 40 MG tablet Take 40 mg by mouth daily.        Marland Kitchen gabapentin (NEURONTIN) 600 MG tablet Take 600 mg by mouth at bedtime.       Marland Kitchen glipiZIDE (GLUCOTROL) 10 MG tablet Take 10 mg by mouth 2 (two) times daily before a meal.        . losartan (COZAAR) 25 MG  tablet Take 25 mg by mouth daily.      . simvastatin (ZOCOR) 40 MG tablet Take 40 mg by mouth at bedtime.          Review of Systems  Constitutional: Negative for fever, chills, appetite change, fatigue and unexpected weight change.  HENT: Negative for ear pain, congestion, sore throat, trouble swallowing, neck pain, voice change and sinus pressure.   Eyes: Negative for visual disturbance.  Respiratory: Negative for cough, shortness of breath, wheezing and stridor.   Cardiovascular: Negative for chest pain, palpitations and leg swelling.  Gastrointestinal: Positive for nausea. Negative for vomiting, abdominal pain, diarrhea, constipation, blood in stool, abdominal distention and anal bleeding.  Genitourinary: Negative for dysuria and flank pain.  Musculoskeletal: Negative for myalgias, arthralgias and gait problem.  Skin: Negative for color change and rash.  Neurological: Negative for dizziness and headaches.  Hematological: Negative for adenopathy. Does not bruise/bleed easily.  Psychiatric/Behavioral: Negative for suicidal ideas, sleep disturbance and dysphoric mood. The patient is not nervous/anxious.        Objective:   Physical Exam  Constitutional: She is oriented to person, place, and time. She appears well-developed and well-nourished. No distress.  HENT:  Head: Normocephalic and atraumatic.  Right Ear: External ear normal.  Left Ear: External ear normal.  Nose: Nose  normal.  Mouth/Throat: Oropharynx is clear and moist. No oropharyngeal exudate.  Eyes: Conjunctivae are normal. Pupils are equal, round, and reactive to light. Right eye exhibits no discharge. Left eye exhibits no discharge. No scleral icterus.  Neck: Normal range of motion. Neck supple. No tracheal deviation present. No thyromegaly present.  Cardiovascular: Normal rate, regular rhythm, normal heart sounds and intact distal pulses.  Exam reveals no gallop and no friction rub.   No murmur heard. Pulmonary/Chest:  Effort normal and breath sounds normal. No respiratory distress. She has no wheezes. She has no rales. She exhibits no tenderness.  Musculoskeletal: Normal range of motion. She exhibits no edema and no tenderness.  Lymphadenopathy:    She has no cervical adenopathy.  Neurological: She is alert and oriented to person, place, and time. No cranial nerve deficit. She exhibits normal muscle tone. Coordination normal.  Skin: Skin is warm and dry. No rash noted. She is not diaphoretic. No erythema. No pallor.  Psychiatric: She has a normal mood and affect. Her behavior is normal. Judgment and thought content normal.          Assessment & Plan:

## 2011-08-27 NOTE — Assessment & Plan Note (Signed)
Recent labs performed at the [facility] emergency department showed stable creatinine. We'll continue to follow. Plan to repeat renal function in 10/2011.

## 2011-09-02 ENCOUNTER — Inpatient Hospital Stay (INDEPENDENT_AMBULATORY_CARE_PROVIDER_SITE_OTHER): Payer: 59 | Admitting: Ophthalmology

## 2011-09-03 ENCOUNTER — Other Ambulatory Visit: Payer: Self-pay | Admitting: *Deleted

## 2011-09-03 MED ORDER — GABAPENTIN 600 MG PO TABS: 600.0000 mg | ORAL_TABLET | Freq: Every day | ORAL | Status: DC

## 2011-09-03 MED ORDER — LOSARTAN POTASSIUM 25 MG PO TABS: 25.0000 mg | ORAL_TABLET | Freq: Every day | ORAL | Status: DC

## 2011-09-03 MED ORDER — CARVEDILOL 6.25 MG PO TABS: 6.2500 mg | ORAL_TABLET | Freq: Two times a day (BID) | ORAL | Status: DC

## 2011-09-04 ENCOUNTER — Ambulatory Visit: Payer: 59 | Admitting: Internal Medicine

## 2011-09-15 ENCOUNTER — Encounter (HOSPITAL_COMMUNITY): Payer: Self-pay | Admitting: Pharmacy Technician

## 2011-09-25 ENCOUNTER — Encounter (HOSPITAL_COMMUNITY): Payer: Self-pay | Admitting: *Deleted

## 2011-09-29 ENCOUNTER — Telehealth: Payer: Self-pay | Admitting: Internal Medicine

## 2011-09-29 MED ORDER — GABAPENTIN 600 MG PO TABS: 600.0000 mg | ORAL_TABLET | Freq: Every day | ORAL | Status: DC

## 2011-09-29 MED ORDER — VOL-CARE RX 1 MG PO TABS: 1.0000 mg | ORAL_TABLET | Freq: Every day | ORAL | Status: DC

## 2011-09-29 NOTE — Telephone Encounter (Signed)
It is vol-care rx, a multivitamin. Sent rx for multivitamin and lasix to mail order pharmacy .

## 2011-09-29 NOTE — Telephone Encounter (Signed)
Patient is needing a refill on volcare Rx (multi vitamin, and generic lasix 40 mg.

## 2011-09-29 NOTE — Telephone Encounter (Signed)
Can you look into this? What is Volcare?

## 2011-09-30 ENCOUNTER — Ambulatory Visit (HOSPITAL_COMMUNITY)
Admission: RE | Admit: 2011-09-30 | Discharge: 2011-10-01 | Disposition: A | Discharge status: Home / Self Care | Payer: 59 | Source: Ambulatory Visit | Attending: Ophthalmology | Admitting: Ophthalmology

## 2011-09-30 ENCOUNTER — Encounter (HOSPITAL_COMMUNITY): Payer: Self-pay | Admitting: Certified Registered Nurse Anesthetist

## 2011-09-30 ENCOUNTER — Ambulatory Visit (HOSPITAL_COMMUNITY): Payer: 59 | Admitting: Certified Registered Nurse Anesthetist

## 2011-09-30 ENCOUNTER — Encounter (HOSPITAL_COMMUNITY)
Admission: RE | Disposition: A | Discharge status: Home / Self Care | Payer: Self-pay | Source: Ambulatory Visit | Attending: Ophthalmology

## 2011-09-30 ENCOUNTER — Encounter (HOSPITAL_COMMUNITY): Payer: Self-pay | Admitting: *Deleted

## 2011-09-30 ENCOUNTER — Encounter (INDEPENDENT_AMBULATORY_CARE_PROVIDER_SITE_OTHER): Payer: 59 | Admitting: Ophthalmology

## 2011-09-30 DIAGNOSIS — H4310 Vitreous hemorrhage, unspecified eye: Secondary | ICD-10-CM | POA: Insufficient documentation

## 2011-09-30 DIAGNOSIS — H3340 Traction detachment of retina, unspecified eye: Secondary | ICD-10-CM

## 2011-09-30 DIAGNOSIS — H43819 Vitreous degeneration, unspecified eye: Secondary | ICD-10-CM

## 2011-09-30 DIAGNOSIS — E11319 Type 2 diabetes mellitus with unspecified diabetic retinopathy without macular edema: Secondary | ICD-10-CM | POA: Insufficient documentation

## 2011-09-30 DIAGNOSIS — N289 Disorder of kidney and ureter, unspecified: Secondary | ICD-10-CM | POA: Insufficient documentation

## 2011-09-30 DIAGNOSIS — H2510 Age-related nuclear cataract, unspecified eye: Secondary | ICD-10-CM

## 2011-09-30 DIAGNOSIS — E11359 Type 2 diabetes mellitus with proliferative diabetic retinopathy without macular edema: Secondary | ICD-10-CM

## 2011-09-30 DIAGNOSIS — H43319 Vitreous membranes and strands, unspecified eye: Secondary | ICD-10-CM | POA: Insufficient documentation

## 2011-09-30 DIAGNOSIS — E1139 Type 2 diabetes mellitus with other diabetic ophthalmic complication: Secondary | ICD-10-CM

## 2011-09-30 DIAGNOSIS — I10 Essential (primary) hypertension: Secondary | ICD-10-CM | POA: Insufficient documentation

## 2011-09-30 DIAGNOSIS — J449 Chronic obstructive pulmonary disease, unspecified: Secondary | ICD-10-CM | POA: Insufficient documentation

## 2011-09-30 DIAGNOSIS — I059 Rheumatic mitral valve disease, unspecified: Secondary | ICD-10-CM | POA: Insufficient documentation

## 2011-09-30 DIAGNOSIS — E113593 Type 2 diabetes mellitus with proliferative diabetic retinopathy without macular edema, bilateral: Secondary | ICD-10-CM

## 2011-09-30 DIAGNOSIS — J4489 Other specified chronic obstructive pulmonary disease: Secondary | ICD-10-CM | POA: Insufficient documentation

## 2011-09-30 DIAGNOSIS — E1165 Type 2 diabetes mellitus with hyperglycemia: Secondary | ICD-10-CM

## 2011-09-30 HISTORY — PX: GAS/FLUID EXCHANGE: SHX5334

## 2011-09-30 HISTORY — PX: PARS PLANA VITRECTOMY: SHX2166

## 2011-09-30 HISTORY — PX: GAS INSERTION: SHX5336

## 2011-09-30 LAB — CBC
Hemoglobin: 9.6 g/dL — ABNORMAL LOW (ref 12.0–15.0)
MCH: 32.1 pg (ref 26.0–34.0)
Platelets: 245 10*3/uL (ref 150–400)
RBC: 2.99 MIL/uL — ABNORMAL LOW (ref 3.87–5.11)
WBC: 13.8 10*3/uL — ABNORMAL HIGH (ref 4.0–10.5)

## 2011-09-30 LAB — GLUCOSE, CAPILLARY
Glucose-Capillary: 321 mg/dL — ABNORMAL HIGH (ref 70–99)
Glucose-Capillary: 279 mg/dL — ABNORMAL HIGH (ref 70–99)
Glucose-Capillary: 231 mg/dL — ABNORMAL HIGH (ref 70–99)
Glucose-Capillary: 292 mg/dL — ABNORMAL HIGH (ref 70–99)

## 2011-09-30 LAB — SURGICAL PCR SCREEN: MRSA, PCR: NEGATIVE

## 2011-09-30 LAB — BASIC METABOLIC PANEL
Calcium: 9.5 mg/dL (ref 8.4–10.5)
GFR calc non Af Amer: 17 mL/min — ABNORMAL LOW (ref >90)
Glucose, Bld: 312 mg/dL — ABNORMAL HIGH (ref 70–99)
Potassium: 5.7 mEq/L — ABNORMAL HIGH (ref 3.5–5.1)
Sodium: 135 mEq/L (ref 135–145)

## 2011-09-30 SURGERY — PARS PLANA VITRECTOMY WITH 25 GAUGE
Anesthesia: General | Site: Eye | Laterality: Right | Wound class: Clean

## 2011-09-30 MED ORDER — EPHEDRINE SULFATE 50 MG/ML IJ SOLN
INTRAMUSCULAR | Status: DC | PRN
  Administered 2011-09-30 (×5): 5 mg via INTRAVENOUS

## 2011-09-30 MED ORDER — BRIMONIDINE TARTRATE 0.2 % OP SOLN
1.0000 [drp] | Freq: Two times a day (BID) | OPHTHALMIC | Status: DC
  Filled 2011-09-30: qty 5

## 2011-09-30 MED ORDER — LIDOCAINE HCL (CARDIAC) 20 MG/ML IV SOLN
INTRAVENOUS | Status: DC | PRN
  Administered 2011-09-30: 50 mg via INTRAVENOUS

## 2011-09-30 MED ORDER — BACITRACIN-POLYMYXIN B 500-10000 UNIT/GM OP OINT
1.0000 "application " | TOPICAL_OINTMENT | Freq: Four times a day (QID) | OPHTHALMIC | Status: DC
  Filled 2011-09-30: qty 3.5

## 2011-09-30 MED ORDER — CARVEDILOL 6.25 MG PO TABS
6.2500 mg | ORAL_TABLET | ORAL | Status: AC
  Administered 2011-09-30: 6.25 mg via ORAL
  Filled 2011-09-30: qty 1

## 2011-09-30 MED ORDER — ATROPINE SULFATE 1 % OP SOLN
OPHTHALMIC | Status: DC | PRN
  Administered 2011-09-30: 1 [drp] via OPHTHALMIC

## 2011-09-30 MED ORDER — HYDROMORPHONE HCL PF 1 MG/ML IJ SOLN
0.2500 mg | INTRAMUSCULAR | Status: DC | PRN
  Administered 2011-09-30 (×2): 0.5 mg via INTRAVENOUS

## 2011-09-30 MED ORDER — STERILE WATER FOR IRRIGATION IR SOLN
Status: DC | PRN
  Administered 2011-09-30: 200 mL

## 2011-09-30 MED ORDER — HYDROMORPHONE HCL PF 1 MG/ML IJ SOLN: 0.2500 mg | INTRAMUSCULAR | Status: DC | PRN

## 2011-09-30 MED ORDER — MUPIROCIN 2 % EX OINT
TOPICAL_OINTMENT | Freq: Once | CUTANEOUS | Status: AC
  Administered 2011-09-30: 11:00:00 via NASAL
  Filled 2011-09-30: qty 22

## 2011-09-30 MED ORDER — GATIFLOXACIN 0.5 % OP SOLN
1.0000 [drp] | OPHTHALMIC | Status: AC | PRN
  Administered 2011-09-30 (×3): 1 [drp] via OPHTHALMIC
  Filled 2011-09-30: qty 2.5

## 2011-09-30 MED ORDER — SODIUM CHLORIDE 0.9 % IV SOLN
INTRAVENOUS | Status: DC
  Administered 2011-09-30 (×2): via INTRAVENOUS

## 2011-09-30 MED ORDER — LIDOCAINE HCL 4 % MT SOLN
OROMUCOSAL | Status: DC | PRN
  Administered 2011-09-30: 4 mL via TOPICAL

## 2011-09-30 MED ORDER — ROCURONIUM BROMIDE 100 MG/10ML IV SOLN
INTRAVENOUS | Status: DC | PRN
  Administered 2011-09-30: 30 mg via INTRAVENOUS

## 2011-09-30 MED ORDER — TROPICAMIDE 1 % OP SOLN
1.0000 [drp] | OPHTHALMIC | Status: AC | PRN
  Administered 2011-09-30 (×3): 1 [drp] via OPHTHALMIC
  Filled 2011-09-30: qty 3

## 2011-09-30 MED ORDER — LOSARTAN POTASSIUM 25 MG PO TABS
25.0000 mg | ORAL_TABLET | ORAL | Status: AC
  Administered 2011-09-30: 25 mg via ORAL
  Filled 2011-09-30 (×2): qty 1

## 2011-09-30 MED ORDER — ACETAZOLAMIDE SODIUM 500 MG IJ SOLR
500.0000 mg | Freq: Once | INTRAMUSCULAR | Status: AC
  Administered 2011-10-01: 500 mg via INTRAVENOUS
  Filled 2011-09-30: qty 500

## 2011-09-30 MED ORDER — GATIFLOXACIN 0.5 % OP SOLN
1.0000 [drp] | Freq: Four times a day (QID) | OPHTHALMIC | Status: DC
  Filled 2011-09-30: qty 2.5

## 2011-09-30 MED ORDER — EPINEPHRINE HCL 1 MG/ML IJ SOLN
INTRAOCULAR | Status: DC | PRN
  Administered 2011-09-30: 14:00:00

## 2011-09-30 MED ORDER — MUPIROCIN 2 % EX OINT
TOPICAL_OINTMENT | CUTANEOUS | Status: AC
  Filled 2011-09-30: qty 22

## 2011-09-30 MED ORDER — INSULIN ASPART 100 UNIT/ML ~~LOC~~ SOLN
0.0000 [IU] | SUBCUTANEOUS | Status: DC
  Administered 2011-09-30: 8 [IU] via SUBCUTANEOUS
  Administered 2011-10-01: 3 [IU] via SUBCUTANEOUS
  Administered 2011-10-01: 8 [IU] via SUBCUTANEOUS

## 2011-09-30 MED ORDER — ACETAMINOPHEN 325 MG PO TABS: 325.0000 mg | ORAL_TABLET | ORAL | Status: DC | PRN

## 2011-09-30 MED ORDER — PREDNISOLONE ACETATE 1 % OP SUSP
1.0000 [drp] | Freq: Four times a day (QID) | OPHTHALMIC | Status: DC
  Filled 2011-09-30: qty 1

## 2011-09-30 MED ORDER — ONDANSETRON HCL 4 MG/2ML IJ SOLN
INTRAMUSCULAR | Status: DC | PRN
  Administered 2011-09-30: 4 mg via INTRAVENOUS

## 2011-09-30 MED ORDER — TETRACAINE HCL 0.5 % OP SOLN
2.0000 [drp] | Freq: Once | OPHTHALMIC | Status: DC
  Filled 2011-09-30: qty 2

## 2011-09-30 MED ORDER — SODIUM CHLORIDE 0.45 % IV SOLN
INTRAVENOUS | Status: DC
  Administered 2011-09-30: 15:00:00 via INTRAVENOUS

## 2011-09-30 MED ORDER — MIDAZOLAM HCL 5 MG/5ML IJ SOLN
INTRAMUSCULAR | Status: DC | PRN
  Administered 2011-09-30: 2 mg via INTRAVENOUS

## 2011-09-30 MED ORDER — CEFAZOLIN SODIUM 1-5 GM-% IV SOLN: 1.0000 g | INTRAVENOUS | Status: DC

## 2011-09-30 MED ORDER — FENTANYL CITRATE 0.05 MG/ML IJ SOLN
INTRAMUSCULAR | Status: DC | PRN
  Administered 2011-09-30: 100 ug via INTRAVENOUS

## 2011-09-30 MED ORDER — INSULIN ASPART 100 UNIT/ML ~~LOC~~ SOLN
6.0000 [IU] | Freq: Once | SUBCUTANEOUS | Status: AC
  Administered 2011-09-30: 6 [IU] via SUBCUTANEOUS
  Filled 2011-09-30: qty 3

## 2011-09-30 MED ORDER — MORPHINE SULFATE 2 MG/ML IJ SOLN
1.0000 mg | INTRAMUSCULAR | Status: DC | PRN
  Administered 2011-09-30: 2 mg via INTRAVENOUS
  Filled 2011-09-30: qty 1

## 2011-09-30 MED ORDER — PROVISC 10 MG/ML IO SOLN
INTRAOCULAR | Status: DC | PRN
  Administered 2011-09-30: .85 mL via INTRAOCULAR

## 2011-09-30 MED ORDER — PHENYLEPHRINE HCL 2.5 % OP SOLN
1.0000 [drp] | OPHTHALMIC | Status: AC | PRN
  Administered 2011-09-30 (×3): 1 [drp] via OPHTHALMIC
  Filled 2011-09-30: qty 3

## 2011-09-30 MED ORDER — GLIPIZIDE 10 MG PO TABS
10.0000 mg | ORAL_TABLET | Freq: Two times a day (BID) | ORAL | Status: DC
  Filled 2011-09-30 (×3): qty 1

## 2011-09-30 MED ORDER — LATANOPROST 0.005 % OP SOLN
1.0000 [drp] | Freq: Every day | OPHTHALMIC | Status: DC
  Filled 2011-09-30: qty 2.5

## 2011-09-30 MED ORDER — NEOSTIGMINE METHYLSULFATE 1 MG/ML IJ SOLN
INTRAMUSCULAR | Status: DC | PRN
  Administered 2011-09-30: 4 mg via INTRAVENOUS

## 2011-09-30 MED ORDER — DEXAMETHASONE SODIUM PHOSPHATE 10 MG/ML IJ SOLN
INTRAMUSCULAR | Status: DC | PRN
  Administered 2011-09-30: 10 mg

## 2011-09-30 MED ORDER — BUPIVACAINE HCL 0.75 % IJ SOLN
INTRAMUSCULAR | Status: DC | PRN
  Administered 2011-09-30: 10 mL

## 2011-09-30 MED ORDER — ONDANSETRON HCL 4 MG/2ML IJ SOLN
INTRAMUSCULAR | Status: AC
  Filled 2011-09-30: qty 2

## 2011-09-30 MED ORDER — PROPOFOL 10 MG/ML IV EMUL
INTRAVENOUS | Status: DC | PRN
  Administered 2011-09-30: 140 mg via INTRAVENOUS

## 2011-09-30 MED ORDER — TEMAZEPAM 15 MG PO CAPS: 15.0000 mg | ORAL_CAPSULE | Freq: Every evening | ORAL | Status: DC | PRN

## 2011-09-30 MED ORDER — OXYCODONE-ACETAMINOPHEN 5-325 MG PO TABS
1.0000 | ORAL_TABLET | ORAL | Status: DC | PRN
  Administered 2011-09-30: 1 via ORAL
  Filled 2011-09-30: qty 1

## 2011-09-30 MED ORDER — BSS IO SOLN
INTRAOCULAR | Status: DC | PRN
  Administered 2011-09-30: 15 mL via INTRAOCULAR

## 2011-09-30 MED ORDER — GLYCOPYRROLATE 0.2 MG/ML IJ SOLN
INTRAMUSCULAR | Status: DC | PRN
  Administered 2011-09-30: .6 mg via INTRAVENOUS

## 2011-09-30 MED ORDER — CYCLOPENTOLATE HCL 1 % OP SOLN
1.0000 [drp] | OPHTHALMIC | Status: AC | PRN
  Administered 2011-09-30 (×3): 1 [drp] via OPHTHALMIC
  Filled 2011-09-30: qty 2

## 2011-09-30 MED ORDER — BSS PLUS IO SOLN
INTRAOCULAR | Status: DC | PRN
  Administered 2011-09-30: 1 via OPHTHALMIC

## 2011-09-30 MED ORDER — HEMOSTATIC AGENTS (NO CHARGE) OPTIME
TOPICAL | Status: DC | PRN
  Administered 2011-09-30: 1

## 2011-09-30 MED ORDER — SODIUM CHLORIDE 0.9 % IJ SOLN
INTRAMUSCULAR | Status: DC | PRN
  Administered 2011-09-30: 14:00:00

## 2011-09-30 MED ORDER — MAGNESIUM HYDROXIDE 400 MG/5ML PO SUSP: 15.0000 mL | Freq: Four times a day (QID) | ORAL | Status: DC | PRN

## 2011-09-30 MED ORDER — ONDANSETRON HCL 4 MG/2ML IJ SOLN
4.0000 mg | Freq: Four times a day (QID) | INTRAMUSCULAR | Status: DC | PRN
  Administered 2011-09-30: 4 mg via INTRAVENOUS
  Filled 2011-09-30: qty 2

## 2011-09-30 MED ORDER — BACITRACIN-POLYMYXIN B 500-10000 UNIT/GM OP OINT
TOPICAL_OINTMENT | OPHTHALMIC | Status: DC | PRN
  Administered 2011-09-30: 1 via OPHTHALMIC

## 2011-09-30 MED ORDER — CEFAZOLIN SODIUM-DEXTROSE 2-3 GM-% IV SOLR
2.0000 g | INTRAVENOUS | Status: AC
  Administered 2011-09-30: 2 g via INTRAVENOUS
  Filled 2011-09-30: qty 50

## 2011-09-30 SURGICAL SUPPLY — 64 items
APPLICATOR DR MATTHEWS STRL (MISCELLANEOUS) ×2 IMPLANT
BLADE EYE CATARACT 19 1.4 BEAV (BLADE) IMPLANT
BLADE MVR KNIFE 19G (BLADE) ×2 IMPLANT
BLADE MVR KNIFE 20G (BLADE) IMPLANT
CANNULA DUAL BORE 23G (CANNULA) IMPLANT
CANNULA FLEX TIP 25G (CANNULA) ×2 IMPLANT
CLOTH BEACON ORANGE TIMEOUT ST (SAFETY) ×2 IMPLANT
CORDS BIPOLAR (ELECTRODE) ×2 IMPLANT
COTTONBALL LRG STERILE PKG (GAUZE/BANDAGES/DRESSINGS) ×6 IMPLANT
DRAPE INCISE 51X51 W/FILM STRL (DRAPES) IMPLANT
DRAPE OPHTHALMIC 77X100 STRL (CUSTOM PROCEDURE TRAY) ×2 IMPLANT
FILTER BLUE MILLIPORE (MISCELLANEOUS) ×2 IMPLANT
FILTER STRAW FLUID ASPIR (MISCELLANEOUS) IMPLANT
FORCEPS ECKARDT ILM 25G SERR (OPHTHALMIC RELATED) IMPLANT
GAS OPHTHALMIC (MISCELLANEOUS) ×2 IMPLANT
GLOVE BIOGEL PI IND STRL 6.5 (GLOVE) ×2 IMPLANT
GLOVE BIOGEL PI INDICATOR 6.5 (GLOVE) ×2
GLOVE SS BIOGEL STRL SZ 7 (GLOVE) ×1 IMPLANT
GLOVE SUPERSENSE BIOGEL SZ 7 (GLOVE) ×1
GLOVE SURG 8.5 LATEX PF (GLOVE) ×2 IMPLANT
GOWN STRL NON-REIN LRG LVL3 (GOWN DISPOSABLE) ×8 IMPLANT
ILLUMINATOR CHOW PICK 25GA (MISCELLANEOUS) ×2 IMPLANT
KIT BASIN OR (CUSTOM PROCEDURE TRAY) ×2 IMPLANT
KIT ROOM TURNOVER OR (KITS) ×2 IMPLANT
KNIFE CRESCENT 2.5 55 ANG (BLADE) ×2 IMPLANT
LENS BIOM SUPER VIEW SET DISP (OPHTHALMIC RELATED) IMPLANT
MARKER SKIN DUAL TIP RULER LAB (MISCELLANEOUS) ×2 IMPLANT
MASK EYE SHIELD (GAUZE/BANDAGES/DRESSINGS) ×2 IMPLANT
MICROPICK 25G (MISCELLANEOUS)
NEEDLE 18GX1X1/2 (RX/OR ONLY) (NEEDLE) ×2 IMPLANT
NEEDLE 25GX 5/8IN NON SAFETY (NEEDLE) ×2 IMPLANT
NEEDLE 27GAX1X1/2 (NEEDLE) IMPLANT
NEEDLE FILTER BLUNT 18X 1/2SAF (NEEDLE) ×1
NEEDLE FILTER BLUNT 18X1 1/2 (NEEDLE) ×1 IMPLANT
NEEDLE HYPO 30X.5 LL (NEEDLE) ×4 IMPLANT
NS IRRIG 1000ML POUR BTL (IV SOLUTION) ×2 IMPLANT
PACK VITRECTOMY CUSTOM (CUSTOM PROCEDURE TRAY) ×2 IMPLANT
PAD ARMBOARD 7.5X6 YLW CONV (MISCELLANEOUS) ×4 IMPLANT
PAD EYE OVAL STERILE LF (GAUZE/BANDAGES/DRESSINGS) ×2 IMPLANT
PAK VITRECTOMY PIK 25 GA (OPHTHALMIC RELATED) ×2 IMPLANT
PENCIL BIPOLAR 25GA STR DISP (OPHTHALMIC RELATED) ×2 IMPLANT
PICK MICROPICK 25G (MISCELLANEOUS) IMPLANT
PROBE DIRECTIONAL LASER (MISCELLANEOUS) ×2 IMPLANT
REPL STRA BRUSH NEEDLE (NEEDLE) IMPLANT
RESERVOIR BACK FLUSH (MISCELLANEOUS) IMPLANT
ROLLS DENTAL (MISCELLANEOUS) ×4 IMPLANT
SCRAPER DIAMOND DUST MEMBRANE (MISCELLANEOUS) IMPLANT
SPONGE SURGIFOAM ABS GEL 12-7 (HEMOSTASIS) ×2 IMPLANT
STOPCOCK 4 WAY LG BORE MALE ST (IV SETS) IMPLANT
SUT CHROMIC 7 0 TG140 8 (SUTURE) IMPLANT
SUT ETHILON 10 0 CS140 6 (SUTURE) IMPLANT
SUT ETHILON 9 0 TG140 8 (SUTURE) ×2 IMPLANT
SUT POLY NON ABSORB 10-0 8 STR (SUTURE) IMPLANT
SUT SILK 4 0 RB 1 (SUTURE) IMPLANT
SYR 20CC LL (SYRINGE) ×2 IMPLANT
SYR 5ML LL (SYRINGE) IMPLANT
SYR BULB 3OZ (MISCELLANEOUS) ×2 IMPLANT
SYR TB 1ML LUER SLIP (SYRINGE) ×2 IMPLANT
SYRINGE 10CC LL (SYRINGE) ×2 IMPLANT
TAPE SURG TRANSPORE 1 IN (GAUZE/BANDAGES/DRESSINGS) ×1 IMPLANT
TAPE SURGICAL TRANSPORE 1 IN (GAUZE/BANDAGES/DRESSINGS) ×1
TOWEL OR 17X24 6PK STRL BLUE (TOWEL DISPOSABLE) ×6 IMPLANT
TROCAR CANNULA 25GA (CANNULA) IMPLANT
WATER STERILE IRR 1000ML POUR (IV SOLUTION) ×2 IMPLANT

## 2011-09-30 NOTE — H&P (Signed)
I examined the patient today and there is no change in the medical status 

## 2011-09-30 NOTE — Anesthesia Procedure Notes (Signed)
Procedure Name: Intubation Date/Time: 09/30/2011 1:15 PM Performed by: [place] Pink Pre-anesthesia Checklist: Patient identified, Timeout performed, Emergency Drugs available, Suction available and Patient being monitored Patient Re-evaluated:Patient Re-evaluated prior to inductionOxygen Delivery Method: Circle system utilized Preoxygenation: Pre-oxygenation with 100% oxygen Intubation Type: IV induction Ventilation: Mask ventilation without difficulty Grade View: Grade III Tube size: 7.0 mm Number of attempts: 1 Airway Equipment and Method: Stylet and LTA kit utilized Placement Confirmation: ETT inserted through vocal cords under direct vision,  positive ETCO2 and breath sounds checked- equal and bilateral Secured at: 21 cm Tube secured with: Tape Dental Injury: Teeth and Oropharynx as per pre-operative assessment

## 2011-09-30 NOTE — Op Note (Signed)
NAME:  Theresa [name], Theresa [name]                 ACCOUNT NO.:  [id number]  MEDICAL RECORD NO.:  [id number]  LOCATION:  [facility]                         FACILITY:  [facility]  PHYSICIAN:  [name]. Zigmund Daniel, M.D. DATE OF BIRTH:  March 16, 1956  DATE OF PROCEDURE:  09/30/2011 DATE OF DISCHARGE:                              OPERATIVE REPORT   ADMISSION DIAGNOSES:  Proliferative diabetic retinopathy, traction retinal detachment, vitreous hemorrhage and posterior membranes all in the right eye.  PROCEDURES:  Repair of complex traction retinal detachment with pars plana vitrectomy, membrane peel, endodiathermy, panretinal photocoagulation, gas fluid exchange all in the right eye.  SURGEON:  [name]. Zigmund Daniel, M.D.  ASSISTANT:  Deatra Ina, SA.  ANESTHESIA:  General.  DETAILS:  Usual prep and drape, 25-gauge trocars placed at 8 and 10 with a 25-gauge 3 layered opening at 2 o'clock.  Provisc placed on the corneal surface.  Pars plana vitrectomy was begun with 25-gauge system just behind the cataractous lens.  The vitrectomy was carried posteriorly and a large traction detachment with plateau was seen in the macular region.  The areas of traction were circumcised with the vitreous cutter and the membrane was peeled with the vitreous cutter as well as 25-gauge forceps and the lighted pick.  The entire macular region was uncovered and the superior arcades where traction was so severe, received a trimming of the vitreous and isolation of vitreous islands.  Surface proliferation was then removed around the mid periphery and then out into the far periphery, there was additional proliferation that was removed.  The endolaser was positioned in the eye, 917 burns were placed around the retinal periphery.  The power was 1000 mW, 1000 microns each, and 0.1 seconds each.  Hemostasis was obtained with Endocautery.  Silicone tip suction line was used to remove all of the fluid from the vitreous in a total gas  fluid exchange, C3F8 and a 14% concentration was then exchanged for intravitreal gas.  The instruments were removed from the eye and the pressure was maintained.  One 9-0 nylon was placed at 2 o'clock and wet- field cautery was used to close the conjunctiva.  Polymyxin and gentamicin were irrigated into tenon space.  Atropine solution was applied.  Marcaine was injected around the globe for postop pain. Closing pressure was 10 with a Baer care tonometer.  Decadron 10 mg was injected into the lower subconjunctival space.  Polysporin ophthalmic ointment, a patch and shield were placed.  The patient awakened and taken to recovery in satisfactory condition.  COMPLICATIONS:  None.  DURATION:  1 hour.     [name]. Zigmund Daniel, M.D.     JDM/MEDQ  D:  09/30/2011  T:  09/30/2011  Job:  [id number]

## 2011-09-30 NOTE — Preoperative (Signed)
Beta Blockers   Reason not to administer Beta Blockers:Not Applicable, took Coreg this am

## 2011-09-30 NOTE — Transfer of Care (Signed)
Immediate Anesthesia Transfer of Care Note  Patient: Theresa [name]  Procedure(s) Performed: Procedure(s) (LRB): PARS PLANA VITRECTOMY WITH 25 GAUGE (Right) GAS/FLUID EXCHANGE (Right) INSERTION OF GAS (Right) MEMBRANE PEEL (Right)  Patient Location: PACU  Anesthesia Type: General  Level of Consciousness: awake, alert  and oriented  Airway & Oxygen Therapy: Patient Spontanous Breathing and Patient connected to nasal cannula oxygen  Post-op Assessment: Report given to PACU RN and Post -op Vital signs reviewed and stable  Post vital signs: Reviewed and stable  Complications: No apparent anesthesia complications

## 2011-09-30 NOTE — Anesthesia Preprocedure Evaluation (Addendum)
Anesthesia Evaluation  Patient identified by MRN, date of birth, ID band Patient awake    Reviewed: Allergy & Precautions, H&P , NPO status , Patient's Chart, lab work & pertinent test results, reviewed documented beta blocker date and time   History of Anesthesia Complications (+) AWARENESS UNDER ANESTHESIA  Airway Mallampati: II TM Distance: >3 FB Neck ROM: Full    Dental No notable dental hx. (+) Teeth Intact   Pulmonary shortness of breath, COPD breath sounds clear to auscultation  Pulmonary exam normal       Cardiovascular hypertension, On Medications and On Home Beta Blockers + Valvular Problems/Murmurs MR Rhythm:Regular Rate:Normal     Neuro/Psych negative neurological ROS  negative psych ROS   GI/Hepatic negative GI ROS, Neg liver ROS,   Endo/Other  Diabetes mellitus-, Poorly Controlled, Type 2, Oral Hypoglycemic Agents  Renal/GU Renal disease  negative genitourinary   Musculoskeletal   Abdominal   Peds  Hematology negative hematology ROS (+)   Anesthesia Other Findings   Reproductive/Obstetrics negative OB ROS                         Anesthesia Physical Anesthesia Plan  ASA: III  Anesthesia Plan: General   Post-op Pain Management:    Induction: Intravenous  Airway Management Planned: Oral ETT  Additional Equipment:   Intra-op Plan:   Post-operative Plan: Extubation in OR  Informed Consent: I have reviewed the patients History and Physical, chart, labs and discussed the procedure including the risks, benefits and alternatives for the proposed anesthesia with the patient or authorized representative who has indicated his/her understanding and acceptance.     Plan Discussed with: CRNA and Surgeon  Anesthesia Plan Comments:         Anesthesia Quick Evaluation

## 2011-09-30 NOTE — Progress Notes (Signed)
Dr. Ola Spurr notified of Hgb 9.6 and Creatinine 2.98, patient does have hx of kidney disease. No orders received.

## 2011-09-30 NOTE — H&P (Signed)
Theresa [name] is an 56 y.o. female.   Chief Complaint:Loss of vision right eye HPI: longstanding diabetes with traction retinal detachment right eye  Past Medical History  Diagnosis Date  . Hypertension   . Hyperlipidemia   . COPD (chronic obstructive pulmonary disease)   . Emphysema   . Shortness of breath     more with exertion  . Pneumonia     June 2012  . Bronchitis     Mar 2012  . Peripheral vascular disease   . Varicose vein   . Edema extremities     legs occasionally  . Chronic constipation   . Colon polyps   . Sickle cell trait   . Anemia   . Diabetes mellitus   . Heart murmur   . Chronic kidney disease     Past Surgical History  Procedure Date  . Abdominal hysterectomy     2000  . Dilation and curettage of uterus     several in the early 80's  . Eye surgery     bilateral laser 2012  . Esophagogastroduodenoscopy     2012  . Colonscopy   . Tubal ligation     1979  . Pars plana vitrectomy 04/22/2011    Procedure: PARS PLANA VITRECTOMY WITH 25 GAUGE;  Surgeon: [name], MD;  Location: [facility];  Service: Ophthalmology;  Laterality: Left;  membrane peel, endolaser, gas fluid exchange, silicone oil, repair of complex traction retinal detachment    Family History  Problem Relation Age of Onset  . Anesthesia problems Neg Hx   . Hypotension Neg Hx   . Malignant hyperthermia Neg Hx   . Pseudochol deficiency Neg Hx   . Stroke Mother   . Heart attack Mother   . Heart disease Mother   . Colon cancer Father   . Colon cancer Sister   . Heart attack Brother   . Stroke Brother   . Diabetes Brother   . Breast cancer Sister   . Diabetes Brother   . Diabetes Daughter    Social History:  reports that she quit smoking about 15 months ago. She does not have any smokeless tobacco history on file. She reports that she does not drink alcohol or use illicit drugs.  Allergies: No Known Allergies   (Not in a hospital admission)  Review of systems otherwise  negative  There were no vitals taken for this visit.  Physical exam: Mental status: oriented x3. Eyes: See eye exam associated with this date of surgery in media tab.  Scanned in by scanning center Ears, Nose, Throat: within normal limits Neck: Within Normal limits General: within normal limits Chest: Within normal limits Breast: deferred Heart: Within normal limits Abdomen: Within normal limits GU: deferred Extremities: within normal limits Skin: within normal limits  Assessment/Plan Traction retinal detachment right eye Plan: To [facility] for Pars Plana vitrectomy with laser treatment, membrane peel, possible silicone oil injection right eye  [name] 09/30/2011, 9:09 AM

## 2011-09-30 NOTE — Progress Notes (Signed)
Dr. Ola Spurr called about CBG 321. Order received for 6 units of Novolog SQ. Given. Told to recheck CBG in 1 hour.

## 2011-09-30 NOTE — Anesthesia Postprocedure Evaluation (Signed)
  Anesthesia Post-op Note  Patient: Theresa [name]  Procedure(s) Performed: Procedure(s) (LRB): PARS PLANA VITRECTOMY WITH 25 GAUGE (Right) GAS/FLUID EXCHANGE (Right) INSERTION OF GAS (Right) MEMBRANE PEEL (Right)  Patient Location: PACU  Anesthesia Type: General  Level of Consciousness: awake  Airway and Oxygen Therapy: Patient Spontanous Breathing and Patient connected to nasal cannula oxygen  Post-op Pain: mild  Post-op Assessment: Post-op Vital signs reviewed, Patient's Cardiovascular Status Stable, Respiratory Function Stable and Patent Airway  Post-op Vital Signs: Reviewed and stable  Complications: No apparent anesthesia complications

## 2011-09-30 NOTE — Procedures (Signed)
Brief Operative note   Preoperative diagnosis:  Pre-Op Diagnosis Codes:    * Vitreous hemorrhage [379.23]    * Proliferative diabetic retinopathy [250.50, 362.02] Postoperative diagnosis  Post-Op Diagnosis Codes:    * Vitreous hemorrhage [379.23]    * Proliferative diabetic retinopathy [250.50, 362.02]  Procedures: Repair of complex traction retinal detachment right eye  Surgeon:  [name], MD...  Assistant:  Deatra Ina SA    Anesthesia: General  Specimen: none  Estimated blood loss:  1cc  Complications: none  Patient sent to PACU in good condition  Composed by [name] MD  Dictation number: [phone]

## 2011-10-01 ENCOUNTER — Encounter (HOSPITAL_COMMUNITY): Payer: Self-pay | Admitting: Ophthalmology

## 2011-10-01 LAB — GLUCOSE, CAPILLARY
Glucose-Capillary: 191 mg/dL — ABNORMAL HIGH (ref 70–99)
Glucose-Capillary: 281 mg/dL — ABNORMAL HIGH (ref 70–99)

## 2011-10-01 MED ORDER — GATIFLOXACIN 0.5 % OP SOLN: 1.0000 [drp] | Freq: Four times a day (QID) | OPHTHALMIC | Status: DC

## 2011-10-01 MED ORDER — BACITRACIN-POLYMYXIN B 500-10000 UNIT/GM OP OINT: 1.0000 "application " | TOPICAL_OINTMENT | Freq: Four times a day (QID) | OPHTHALMIC | Status: AC

## 2011-10-01 MED ORDER — PREDNISOLONE ACETATE 1 % OP SUSP: 1.0000 [drp] | Freq: Four times a day (QID) | OPHTHALMIC | Status: AC

## 2011-10-01 NOTE — Progress Notes (Signed)
10/01/2011, 6:50 AM  Mental Status:  Awake, Alert, Oriented  Anterior segment: Cornea  Clear    Anterior Chamber Clear    Lens:   Cataract  Intra Ocular Pressure 19 mmHg with Tonopen  Vitreous: Clear 30%gas bubble   Retina:  Attached Good laser reaction   Impression: Excellent result Retina attached   Final Diagnosis: Active Problems:  * No active hospital problems. *    Plan: start post operative eye drops.  Discharge to home.  Give post operative instructions  [name] 10/01/2011, 6:50 AM

## 2011-10-01 NOTE — Discharge Summary (Signed)
Discharge summary not needed on [facility] patients per medical records. 

## 2011-10-01 NOTE — H&P (Signed)
I examined the patient today and there is no change in the medical status 

## 2011-10-02 MED FILL — Insulin Aspart Inj 100 Unit/ML: SUBCUTANEOUS | Qty: 0.06 | Status: AC

## 2011-10-08 ENCOUNTER — Inpatient Hospital Stay (INDEPENDENT_AMBULATORY_CARE_PROVIDER_SITE_OTHER): Payer: 59 | Admitting: Ophthalmology

## 2011-10-08 DIAGNOSIS — E11359 Type 2 diabetes mellitus with proliferative diabetic retinopathy without macular edema: Secondary | ICD-10-CM

## 2011-10-08 DIAGNOSIS — E1139 Type 2 diabetes mellitus with other diabetic ophthalmic complication: Secondary | ICD-10-CM

## 2011-10-08 DIAGNOSIS — E1165 Type 2 diabetes mellitus with hyperglycemia: Secondary | ICD-10-CM

## 2011-10-10 ENCOUNTER — Telehealth: Payer: Self-pay | Admitting: Internal Medicine

## 2011-10-10 ENCOUNTER — Encounter: Payer: Self-pay | Admitting: Internal Medicine

## 2011-10-10 ENCOUNTER — Ambulatory Visit (INDEPENDENT_AMBULATORY_CARE_PROVIDER_SITE_OTHER): Payer: 59 | Admitting: Internal Medicine

## 2011-10-10 VITALS — BP 132/78 | HR 79 | Temp 98.1°F | Resp 16 | Wt 179.5 lb

## 2011-10-10 DIAGNOSIS — N058 Unspecified nephritic syndrome with other morphologic changes: Secondary | ICD-10-CM

## 2011-10-10 DIAGNOSIS — E1129 Type 2 diabetes mellitus with other diabetic kidney complication: Secondary | ICD-10-CM

## 2011-10-10 DIAGNOSIS — E119 Type 2 diabetes mellitus without complications: Secondary | ICD-10-CM

## 2011-10-10 DIAGNOSIS — N184 Chronic kidney disease, stage 4 (severe): Secondary | ICD-10-CM

## 2011-10-10 DIAGNOSIS — E1121 Type 2 diabetes mellitus with diabetic nephropathy: Secondary | ICD-10-CM

## 2011-10-10 MED ORDER — INSULIN PEN NEEDLE 32G X 6 MM MISC: 1.0000 [IU] | Status: DC | PRN

## 2011-10-10 MED ORDER — INSULIN DETEMIR 100 UNIT/ML ~~LOC~~ SOLN: 10.0000 [IU] | Freq: Every day | SUBCUTANEOUS | Status: DC

## 2011-10-10 NOTE — Progress Notes (Signed)
Subjective:    Patient ID: Theresa [name], female    DOB: 1955/07/27, 56 y.o.   MRN: [id number]  HPI 56 year old female with chronic kidney disease, hypertension, and diabetes presents for followup. She reports that she has felt generally poorly over the last several weeks with frequent episodes of nausea. She notes that her nephrologist recently started her on a medication to help with this. She is unsure of the medications name. She notes that her sugars have been very elevated. Most sugars have been above 200. She reports compliance with glipizide. She is concerned that this medication may be contributing to her nausea. She has been on insulin in the past like to go back to using insulin.  Outpatient Encounter Prescriptions as of 10/10/2011  Medication Sig Dispense Refill  . acetaminophen (TYLENOL) 325 MG tablet Take 650 mg by mouth every 6 (six) hours as needed. For pain       . albuterol (PROVENTIL HFA;VENTOLIN HFA) 108 (90 BASE) MCG/ACT inhaler Inhale 2 puffs into the lungs every 6 (six) hours as needed. For shortness of breath       . B Complex-C-Folic Acid (VOL-CARE RX) 1 MG TABS Take 1 tablet (1 mg total) by mouth daily.  90 tablet  3  . bacitracin-polymyxin b (POLYSPORIN) ophthalmic ointment Place 1 application into the right eye 4 (four) times daily. apply to eye every 12 hours while awake  3.5 g    . beclomethasone (QVAR) 40 MCG/ACT inhaler Inhale 1 puff into the lungs daily as needed. For shortness of breath      . carvedilol (COREG) 6.25 MG tablet Take 1 tablet (6.25 mg total) by mouth 2 (two) times daily.  180 tablet  2  . fluticasone (FLONASE) 50 MCG/ACT nasal spray Place 2 sprays into the nose daily as needed. For allergies      . furosemide (LASIX) 40 MG tablet Take 40 mg by mouth daily.        Marland Kitchen gabapentin (NEURONTIN) 600 MG tablet Take 1 tablet (600 mg total) by mouth at bedtime.  90 tablet  2  . gatifloxacin (ZYMAXID) 0.5 % SOLN Place 1 drop into the right eye 4 (four) times  daily.      Marland Kitchen HYDROmorphone (DILAUDID) 1 MG/ML SOLN injection Inject 0.25-0.5 mLs (0.25-0.5 mg total) into the vein every 5 (five) minutes as needed (pain score >/= 4 (up to 2 mg)).  0.1 mL  0  . losartan (COZAAR) 25 MG tablet Take 1 tablet (25 mg total) by mouth daily.  90 tablet  2  . prednisoLONE acetate (PRED FORTE) 1 % ophthalmic suspension Place 1 drop into the right eye 4 (four) times daily.  5 mL    . simvastatin (ZOCOR) 40 MG tablet Take 40 mg by mouth at bedtime.        Marland Kitchen DISCONTD: glipiZIDE (GLUCOTROL) 10 MG tablet Take 10 mg by mouth 2 (two) times daily before a meal.        . insulin detemir (LEVEMIR) 100 UNIT/ML injection Inject 10 Units into the skin daily.  10 mL  12  . Insulin Pen Needle 32G X 6 MM MISC 1 Units by Does not apply route as needed.  90 each  3    Review of Systems  Constitutional: Positive for fatigue. Negative for fever, chills, appetite change and unexpected weight change.  HENT: Negative for ear pain, congestion, sore throat, trouble swallowing, neck pain, voice change and sinus pressure.   Eyes: Negative for visual disturbance.  Respiratory: Negative for cough, shortness of breath, wheezing and stridor.   Cardiovascular: Negative for chest pain, palpitations and leg swelling.  Gastrointestinal: Positive for nausea. Negative for vomiting, abdominal pain, diarrhea, constipation, blood in stool, abdominal distention and anal bleeding.  Genitourinary: Negative for dysuria and flank pain.  Musculoskeletal: Negative for myalgias, arthralgias and gait problem.  Skin: Negative for color change and rash.  Neurological: Negative for dizziness and headaches.  Hematological: Negative for adenopathy. Does not bruise/bleed easily.  Psychiatric/Behavioral: Negative for suicidal ideas, sleep disturbance and dysphoric mood. The patient is not nervous/anxious.    BP 132/78  Pulse 79  Temp(Src) 98.1 F (36.7 C) (Oral)  Resp 16  Wt 179 lb 8 oz (81.421 kg)  SpO2 100%       Objective:   Physical Exam  Constitutional: She is oriented to person, place, and time. She appears well-developed and well-nourished. No distress.  HENT:  Head: Normocephalic and atraumatic.  Right Ear: External ear normal.  Left Ear: External ear normal.  Nose: Nose normal.  Mouth/Throat: Oropharynx is clear and moist. No oropharyngeal exudate.  Eyes: Conjunctivae are normal. Pupils are equal, round, and reactive to light. Right eye exhibits no discharge. Left eye exhibits no discharge. No scleral icterus.  Neck: Normal range of motion. Neck supple. No tracheal deviation present. No thyromegaly present.  Cardiovascular: Normal rate, regular rhythm, normal heart sounds and intact distal pulses.  Exam reveals no gallop and no friction rub.   No murmur heard. Pulmonary/Chest: Effort normal and breath sounds normal. No respiratory distress. She has no wheezes. She has no rales. She exhibits no tenderness.  Musculoskeletal: Normal range of motion. She exhibits no edema and no tenderness.  Lymphadenopathy:    She has no cervical adenopathy.  Neurological: She is alert and oriented to person, place, and time. No cranial nerve deficit. She exhibits normal muscle tone. Coordination normal.  Skin: Skin is warm and dry. No rash noted. She is not diaphoretic. No erythema. No pallor.  Psychiatric: She has a normal mood and affect. Her behavior is normal. Judgment and thought content normal.          Assessment & Plan:

## 2011-10-10 NOTE — Telephone Encounter (Signed)
[id number] Pt called her glucose machine trutrack nipro The meds that dr shing prescribed Metoclopramide 5mg  3 times daily

## 2011-10-10 NOTE — Assessment & Plan Note (Signed)
Recent blood sugars have been elevated. Will stop glipizide and start Levemir. Patient was instructed on Levemir and administration today. She will start with 10 units daily. She will record sugars twice daily at a minimum. She will call with blood sugar report early next week. We will gradually titrate dose up. She will followup in one week.

## 2011-10-10 NOTE — Assessment & Plan Note (Signed)
Patient reports that recent kidney function testing has been stable with her nephrologist. Will obtain notes on recent evaluation.

## 2011-10-14 ENCOUNTER — Telehealth: Payer: Self-pay | Admitting: Internal Medicine

## 2011-10-14 ENCOUNTER — Other Ambulatory Visit: Payer: Self-pay | Admitting: Internal Medicine

## 2011-10-14 MED ORDER — FUROSEMIDE 40 MG PO TABS: 40.0000 mg | ORAL_TABLET | Freq: Every day | ORAL | Status: DC

## 2011-10-14 NOTE — Telephone Encounter (Signed)
This message makes no sense, I have tried calling the patient to figure out what she needs, but have been unable to reach her. I have left message asking her to call me back. Will close note and will wait for her to call me back.

## 2011-10-14 NOTE — Telephone Encounter (Signed)
Patient called and stated you wanted to know the name of her Glucometer, it is TrueTrack.

## 2011-10-14 NOTE — Telephone Encounter (Signed)
Okay. We can call in test strips if she needs them.

## 2011-10-15 ENCOUNTER — Telehealth: Payer: Self-pay | Admitting: Internal Medicine

## 2011-10-15 NOTE — Telephone Encounter (Signed)
[id number] Pt called dr walker put her on insulin pen. She received insulin from her mail pharmacy and it was insulin the bottle.  Pt wants to know what to do with this  She does not know how to use this.  Pt is going to have to pay for this and she doesn't use this.  Please advise

## 2011-10-16 NOTE — Telephone Encounter (Signed)
Patient says that she was supposed to have gotten the insulin pen, but instead got the insulin vial. She is asking if she was supposed to have gotten the pen.

## 2011-10-16 NOTE — Telephone Encounter (Signed)
This should have been the pen.  Can she return the medication and we can reorder the pen?

## 2011-10-16 NOTE — Telephone Encounter (Signed)
Chart updated with pen. Patient can't return the insulin because she has already gotten it through mail order. I am giving her samples of the pen so that she doesn't have to pay for it again.

## 2011-10-23 NOTE — Progress Notes (Signed)
This encounter was created in error - please disregard.  This encounter was created in error - please disregard.

## 2011-10-27 ENCOUNTER — Telehealth: Payer: Self-pay | Admitting: Internal Medicine

## 2011-10-27 ENCOUNTER — Ambulatory Visit (INDEPENDENT_AMBULATORY_CARE_PROVIDER_SITE_OTHER): Payer: 59 | Admitting: Internal Medicine

## 2011-10-27 ENCOUNTER — Encounter: Payer: Self-pay | Admitting: Internal Medicine

## 2011-10-27 ENCOUNTER — Ambulatory Visit: Payer: 59 | Admitting: Internal Medicine

## 2011-10-27 VITALS — BP 149/83 | HR 80 | Temp 98.1°F | Resp 16 | Wt 179.0 lb

## 2011-10-27 DIAGNOSIS — E1169 Type 2 diabetes mellitus with other specified complication: Secondary | ICD-10-CM

## 2011-10-27 DIAGNOSIS — N058 Unspecified nephritic syndrome with other morphologic changes: Secondary | ICD-10-CM

## 2011-10-27 DIAGNOSIS — I152 Hypertension secondary to endocrine disorders: Secondary | ICD-10-CM

## 2011-10-27 DIAGNOSIS — I10 Essential (primary) hypertension: Secondary | ICD-10-CM

## 2011-10-27 DIAGNOSIS — M25519 Pain in unspecified shoulder: Secondary | ICD-10-CM

## 2011-10-27 DIAGNOSIS — E1159 Type 2 diabetes mellitus with other circulatory complications: Secondary | ICD-10-CM

## 2011-10-27 DIAGNOSIS — E1121 Type 2 diabetes mellitus with diabetic nephropathy: Secondary | ICD-10-CM

## 2011-10-27 DIAGNOSIS — E1129 Type 2 diabetes mellitus with other diabetic kidney complication: Secondary | ICD-10-CM

## 2011-10-27 DIAGNOSIS — M25511 Pain in right shoulder: Secondary | ICD-10-CM

## 2011-10-27 NOTE — Assessment & Plan Note (Signed)
Blood pressure well-controlled today. We'll continue current medications. Followup one month.

## 2011-10-27 NOTE — Patient Instructions (Signed)
Increase Levemir to 15units daily x 1 week. Then, if blood sugars still above 200, increase to 20units daily.

## 2011-10-27 NOTE — Progress Notes (Signed)
Subjective:    Patient ID: Theresa [name], female    DOB: [birth year], 56 y.o.   MRN: [id number]  HPI 56 year old female with diabetes, chronic kidney disease, hypertension presents for followup. In regards to her diabetes, she reports full compliance with her Levemir. However, she reports that her blood sugars have been elevated, with fasting sugars between 150 and 200 and postprandial sugars near 300.  She is also concerned today about several month history of right shoulder pain. She denies any known trauma to her shoulder. She denies any weakness in her right arm. She reports some popping sounds when she rotates or abducts her right arm. This was treated with Tylenol in the past with minimal improvement.  Outpatient Encounter Prescriptions as of 10/27/2011  Medication Sig Dispense Refill  . acetaminophen (TYLENOL) 325 MG tablet Take 650 mg by mouth every 6 (six) hours as needed. For pain       . albuterol (PROVENTIL HFA;VENTOLIN HFA) 108 (90 BASE) MCG/ACT inhaler Inhale 2 puffs into the lungs every 6 (six) hours as needed. For shortness of breath       . B Complex-C-Folic Acid (VOL-CARE RX) 1 MG TABS Take 1 tablet (1 mg total) by mouth daily.  90 tablet  3  . beclomethasone (QVAR) 40 MCG/ACT inhaler Inhale 1 puff into the lungs daily as needed. For shortness of breath      . carvedilol (COREG) 6.25 MG tablet Take 1 tablet (6.25 mg total) by mouth 2 (two) times daily.  180 tablet  2  . fluticasone (FLONASE) 50 MCG/ACT nasal spray Place 2 sprays into the nose daily as needed. For allergies      . furosemide (LASIX) 40 MG tablet Take 1 tablet (40 mg total) by mouth daily.  90 tablet  2  . gabapentin (NEURONTIN) 600 MG tablet Take 1 tablet (600 mg total) by mouth at bedtime.  90 tablet  2  . gatifloxacin (ZYMAXID) 0.5 % SOLN Place 1 drop into the right eye 4 (four) times daily.      Marland Kitchen HYDROmorphone (DILAUDID) 1 MG/ML SOLN injection Inject 0.25-0.5 mLs (0.25-0.5 mg total) into the vein every 5 (five)  minutes as needed (pain score >/= 4 (up to 2 mg)).  0.1 mL  0  . insulin detemir (LEVEMIR FLEXPEN) 100 UNIT/ML injection Inject 10 Units into the skin daily.      Marland Kitchen losartan (COZAAR) 25 MG tablet Take 1 tablet (25 mg total) by mouth daily.  90 tablet  2  . simvastatin (ZOCOR) 40 MG tablet Take 40 mg by mouth at bedtime.         BP 149/83  Pulse 80  Temp(Src) 98.1 F (36.7 C) (Oral)  Resp 16  Wt 179 lb (81.194 kg)  SpO2 99%  Review of Systems  Constitutional: Negative for fever, chills, appetite change, fatigue and unexpected weight change.  HENT: Negative for ear pain, congestion, sore throat, trouble swallowing, neck pain, voice change and sinus pressure.   Eyes: Negative for visual disturbance.  Respiratory: Negative for cough, shortness of breath, wheezing and stridor.   Cardiovascular: Negative for chest pain, palpitations and leg swelling.  Gastrointestinal: Negative for nausea, vomiting, abdominal pain, diarrhea, constipation, blood in stool, abdominal distention and anal bleeding.  Genitourinary: Negative for dysuria and flank pain.  Musculoskeletal: Positive for myalgias and arthralgias. Negative for gait problem.  Skin: Negative for color change and rash.  Neurological: Negative for dizziness and headaches.  Hematological: Negative for adenopathy. Does not bruise/bleed easily.  Psychiatric/Behavioral: Negative for suicidal ideas, sleep disturbance and dysphoric mood. The patient is not nervous/anxious.        Objective:   Physical Exam  Constitutional: She is oriented to person, place, and time. She appears well-developed and well-nourished. No distress.  HENT:  Head: Normocephalic and atraumatic.  Right Ear: External ear normal.  Left Ear: External ear normal.  Nose: Nose normal.  Mouth/Throat: Oropharynx is clear and moist. No oropharyngeal exudate.  Eyes: Conjunctivae are normal. Pupils are equal, round, and reactive to light. Right eye exhibits no discharge. Left  eye exhibits no discharge. No scleral icterus.  Neck: Normal range of motion. Neck supple. No tracheal deviation present. No thyromegaly present.  Cardiovascular: Normal rate, regular rhythm, normal heart sounds and intact distal pulses.  Exam reveals no gallop and no friction rub.   No murmur heard. Pulmonary/Chest: Effort normal and breath sounds normal. No respiratory distress. She has no wheezes. She has no rales. She exhibits no tenderness.  Musculoskeletal: Normal range of motion. She exhibits no edema and no tenderness.       Right shoulder: She exhibits tenderness, crepitus and pain. She exhibits normal range of motion and no swelling.  Lymphadenopathy:    She has no cervical adenopathy.  Neurological: She is alert and oriented to person, place, and time. No cranial nerve deficit. She exhibits normal muscle tone. Coordination normal.  Skin: Skin is warm and dry. No rash noted. She is not diaphoretic. No erythema. No pallor.  Psychiatric: She has a normal mood and affect. Her behavior is normal. Judgment and thought content normal.          Assessment & Plan:

## 2011-10-27 NOTE — Assessment & Plan Note (Signed)
Suspect osteoarthritis. Will set up with orthopedics for evaluation and possible steroid injection.

## 2011-10-27 NOTE — Assessment & Plan Note (Signed)
Will increase dose of Levemir to 15 units daily. Patient will e-mail with blood sugar results. If blood sugars continue to be greater than 200, she will increase to 20 units daily. She will followup in 4 weeks.

## 2011-10-27 NOTE — Telephone Encounter (Signed)
[id number] Pt called and had questions about the cardology appointment that you left on her answering machine

## 2011-10-29 ENCOUNTER — Encounter (INDEPENDENT_AMBULATORY_CARE_PROVIDER_SITE_OTHER): Payer: 59 | Admitting: Ophthalmology

## 2011-10-29 DIAGNOSIS — E11359 Type 2 diabetes mellitus with proliferative diabetic retinopathy without macular edema: Secondary | ICD-10-CM

## 2011-10-29 DIAGNOSIS — H4310 Vitreous hemorrhage, unspecified eye: Secondary | ICD-10-CM

## 2011-10-29 DIAGNOSIS — E1139 Type 2 diabetes mellitus with other diabetic ophthalmic complication: Secondary | ICD-10-CM

## 2011-10-29 DIAGNOSIS — E1165 Type 2 diabetes mellitus with hyperglycemia: Secondary | ICD-10-CM

## 2011-11-27 ENCOUNTER — Ambulatory Visit (INDEPENDENT_AMBULATORY_CARE_PROVIDER_SITE_OTHER): Payer: Self-pay | Admitting: Internal Medicine

## 2011-11-27 ENCOUNTER — Encounter: Payer: Self-pay | Admitting: Internal Medicine

## 2011-11-27 VITALS — BP 160/80 | HR 88 | Temp 99.1°F | Wt 181.2 lb

## 2011-11-27 DIAGNOSIS — N184 Chronic kidney disease, stage 4 (severe): Secondary | ICD-10-CM

## 2011-11-27 DIAGNOSIS — N058 Unspecified nephritic syndrome with other morphologic changes: Secondary | ICD-10-CM

## 2011-11-27 DIAGNOSIS — I10 Essential (primary) hypertension: Secondary | ICD-10-CM

## 2011-11-27 DIAGNOSIS — I152 Hypertension secondary to endocrine disorders: Secondary | ICD-10-CM

## 2011-11-27 DIAGNOSIS — E1121 Type 2 diabetes mellitus with diabetic nephropathy: Secondary | ICD-10-CM

## 2011-11-27 DIAGNOSIS — E1169 Type 2 diabetes mellitus with other specified complication: Secondary | ICD-10-CM

## 2011-11-27 DIAGNOSIS — M25511 Pain in right shoulder: Secondary | ICD-10-CM

## 2011-11-27 DIAGNOSIS — M25519 Pain in unspecified shoulder: Secondary | ICD-10-CM

## 2011-11-27 DIAGNOSIS — E1129 Type 2 diabetes mellitus with other diabetic kidney complication: Secondary | ICD-10-CM

## 2011-11-27 MED ORDER — HYDROCODONE-ACETAMINOPHEN 5-500 MG PO TABS: 1.0000 | ORAL_TABLET | Freq: Three times a day (TID) | ORAL | Status: AC | PRN

## 2011-11-27 NOTE — Patient Instructions (Signed)
Goal fasting blood sugar 100-150.

## 2011-11-28 ENCOUNTER — Encounter: Payer: Self-pay | Admitting: Internal Medicine

## 2011-11-28 NOTE — Assessment & Plan Note (Signed)
Blood pressure elevated today. Pt reports nephrologist aware and opted not to increase anti-hypertensive meds.Will get notes from recent visit with nephrologist. Will not make any changes to medication today. Followup one month.

## 2011-11-28 NOTE — Assessment & Plan Note (Signed)
Patient reports recent renal function was stable. Will get records from her nephrologist.

## 2011-11-28 NOTE — Assessment & Plan Note (Signed)
Exam and symptoms are consistent with osteoarthritis. Patient has been unable to see orthopedics because of insurance issues. Will start Vicodin at night to help with pain. Patient is applying for [facility] care program.

## 2011-11-28 NOTE — Progress Notes (Signed)
Subjective:    Patient ID: Theresa [name], female    DOB: 1956/04/24, 56 y.o.   MRN: [id number]  HPI 56 year old female with history of hypertension, chronic kidney disease, hyperlipidemia, and right shoulder pain presents for followup. In regards to her diabetes, she reports that she is now taking 25 units of Levemir nightly. She reports that her fasting sugars continue to be near 200. She denies any low blood sugars. She denies any side effects from her medication. She has tried to adopt healthier diet. She is, however, typically eating only 2 meals per day.  In regards to blood pressure, she reports she was recently seen by her nephrologist. Her blood pressure has been slightly elevated but renal function was stable. No changes were made in her medications.  In regards to her right shoulder pain, she was unable to see orthopedic surgery because she has lost her insurance coverage and cannot afford to be out-of-pocket expense. She has significant pain and popping sound in her right shoulder with movement. She has not been taking any medication for this. She reports that the pain is keeping her awake at night.  Outpatient Encounter Prescriptions as of 11/27/2011  Medication Sig Dispense Refill  . acetaminophen (TYLENOL) 325 MG tablet Take 650 mg by mouth every 6 (six) hours as needed. For pain       . albuterol (PROVENTIL HFA;VENTOLIN HFA) 108 (90 BASE) MCG/ACT inhaler Inhale 2 puffs into the lungs every 6 (six) hours as needed. For shortness of breath       . B Complex-C-Folic Acid (VOL-CARE RX) 1 MG TABS Take 1 tablet (1 mg total) by mouth daily.  90 tablet  3  . beclomethasone (QVAR) 40 MCG/ACT inhaler Inhale 1 puff into the lungs daily as needed. For shortness of breath      . carvedilol (COREG) 6.25 MG tablet Take 1 tablet (6.25 mg total) by mouth 2 (two) times daily.  180 tablet  2  . fluticasone (FLONASE) 50 MCG/ACT nasal spray Place 2 sprays into the nose daily as needed. For allergies        . furosemide (LASIX) 40 MG tablet Take 1 tablet (40 mg total) by mouth daily.  90 tablet  2  . gabapentin (NEURONTIN) 600 MG tablet Take 1 tablet (600 mg total) by mouth at bedtime.  90 tablet  2  . gatifloxacin (ZYMAXID) 0.5 % SOLN Place 1 drop into the right eye 4 (four) times daily.      Marland Kitchen HYDROmorphone (DILAUDID) 1 MG/ML SOLN injection Inject 0.25-0.5 mLs (0.25-0.5 mg total) into the vein every 5 (five) minutes as needed (pain score >/= 4 (up to 2 mg)).  0.1 mL  0  . insulin detemir (LEVEMIR FLEXPEN) 100 UNIT/ML injection Inject 10 Units into the skin daily.      Marland Kitchen losartan (COZAAR) 25 MG tablet Take 1 tablet (25 mg total) by mouth daily.  90 tablet  2  . simvastatin (ZOCOR) 40 MG tablet Take 40 mg by mouth at bedtime.        Marland Kitchen HYDROcodone-acetaminophen (VICODIN) 5-500 MG per tablet Take 1 tablet by mouth every 8 (eight) hours as needed for pain.  60 tablet  3    Review of Systems  Constitutional: Negative for fever, chills, appetite change, fatigue and unexpected weight change.  HENT: Negative for ear pain, congestion, sore throat, trouble swallowing, neck pain, voice change and sinus pressure.   Eyes: Negative for visual disturbance.  Respiratory: Negative for cough, shortness of  breath, wheezing and stridor.   Cardiovascular: Negative for chest pain, palpitations and leg swelling.  Gastrointestinal: Negative for nausea, vomiting, abdominal pain, diarrhea, constipation, blood in stool, abdominal distention and anal bleeding.  Genitourinary: Negative for dysuria and flank pain.  Musculoskeletal: Positive for myalgias and arthralgias. Negative for gait problem.  Skin: Negative for color change and rash.  Neurological: Negative for dizziness and headaches.  Hematological: Negative for adenopathy. Does not bruise/bleed easily.  Psychiatric/Behavioral: Negative for suicidal ideas, disturbed wake/sleep cycle and dysphoric mood. The patient is not nervous/anxious.    BP 160/80  Pulse 88   Temp 99.1 F (37.3 C)  Wt 181 lb 4 oz (82.214 kg)  SpO2 98%     Objective:   Physical Exam  Constitutional: She is oriented to person, place, and time. She appears well-developed and well-nourished. No distress.  HENT:  Head: Normocephalic and atraumatic.  Right Ear: External ear normal.  Left Ear: External ear normal.  Nose: Nose normal.  Mouth/Throat: Oropharynx is clear and moist. No oropharyngeal exudate.  Eyes: Conjunctivae are normal. Pupils are equal, round, and reactive to light. Right eye exhibits no discharge. Left eye exhibits no discharge. No scleral icterus.  Neck: Normal range of motion. Neck supple. No tracheal deviation present. No thyromegaly present.  Cardiovascular: Normal rate, regular rhythm, normal heart sounds and intact distal pulses.  Exam reveals no gallop and no friction rub.   No murmur heard. Pulmonary/Chest: Effort normal and breath sounds normal. No respiratory distress. She has no wheezes. She has no rales. She exhibits no tenderness.  Musculoskeletal: She exhibits no edema and no tenderness.       Right shoulder: She exhibits decreased range of motion, tenderness and crepitus.  Lymphadenopathy:    She has no cervical adenopathy.  Neurological: She is alert and oriented to person, place, and time. No cranial nerve deficit. She exhibits normal muscle tone. Coordination normal.  Skin: Skin is warm and dry. No rash noted. She is not diaphoretic. No erythema. No pallor.  Psychiatric: She has a normal mood and affect. Her behavior is normal. Judgment and thought content normal.          Assessment & Plan:

## 2011-11-28 NOTE — Assessment & Plan Note (Signed)
Blood sugars persistently elevated. Will titrate up dose of Levemir to 30 units nightly. We discussed continued titration until fasting blood sugar closer to 150. Patient will followup in one month.

## 2011-12-10 ENCOUNTER — Encounter (INDEPENDENT_AMBULATORY_CARE_PROVIDER_SITE_OTHER): Payer: Self-pay | Admitting: Ophthalmology

## 2011-12-10 DIAGNOSIS — E11359 Type 2 diabetes mellitus with proliferative diabetic retinopathy without macular edema: Secondary | ICD-10-CM

## 2011-12-10 DIAGNOSIS — E1139 Type 2 diabetes mellitus with other diabetic ophthalmic complication: Secondary | ICD-10-CM

## 2011-12-15 NOTE — H&P (Signed)
Theresa [name] is an 56 y.o. female.   Chief Complaint: Proliferative diabetic retinopathy with blurred vision  Left eye HPI: longstanding diabetic with previous traction retinal detachment now needs silicone oil removal  Past Medical History  Diagnosis Date  . Hypertension   . Hyperlipidemia   . COPD (chronic obstructive pulmonary disease)   . Emphysema   . Shortness of breath     more with exertion  . Pneumonia     June 2012  . Bronchitis     Mar 2012  . Peripheral vascular disease   . Varicose vein   . Edema extremities     legs occasionally  . Chronic constipation   . Colon polyps   . Sickle cell trait   . Anemia   . Diabetes mellitus   . Heart murmur   . Chronic kidney disease     Past Surgical History  Procedure Date  . Abdominal hysterectomy     2000  . Dilation and curettage of uterus     several in the early 80's  . Eye surgery     bilateral laser 2012  . Esophagogastroduodenoscopy     2012  . Colonscopy   . Tubal ligation     1979  . Pars plana vitrectomy 04/22/2011    Procedure: PARS PLANA VITRECTOMY WITH 25 GAUGE;  Surgeon: [name], MD;  Location: [place];  Service: Ophthalmology;  Laterality: Left;  membrane peel, endolaser, gas fluid exchange, silicone oil, repair of complex traction retinal detachment  . Pars plana vitrectomy 09/30/2011    Procedure: PARS PLANA VITRECTOMY WITH 25 GAUGE;  Surgeon: [name], MD;  Location: [place];  Service: Ophthalmology;  Laterality: Right;  Endolaser; Repair of Complex Traction Retinal Detachment  . Gas/fluid exchange 09/30/2011    Procedure: GAS/FLUID EXCHANGE;  Surgeon: [name], MD;  Location: [place];  Service: Ophthalmology;  Laterality: Right;  . Gas insertion 09/30/2011    Procedure: INSERTION OF GAS;  Surgeon: [name], MD;  Location: [place];  Service: Ophthalmology;  Laterality: Right;  [id number]    Family History  Problem Relation Age of Onset  . Anesthesia problems Neg Hx   . Hypotension  Neg Hx   . Malignant hyperthermia Neg Hx   . Pseudochol deficiency Neg Hx   . Stroke Mother   . Heart attack Mother   . Heart disease Mother   . Colon cancer Father   . Colon cancer Sister   . Heart attack Brother   . Stroke Brother   . Diabetes Brother   . Breast cancer Sister   . Diabetes Brother   . Diabetes Daughter    Social History:  reports that she quit smoking about 18 months ago. She does not have any smokeless tobacco history on file. She reports that she does not drink alcohol or use illicit drugs.  Allergies: No Known Allergies  No prescriptions prior to admission    Review of systems otherwise negative  There were no vitals taken for this visit.  Physical exam: Mental status: oriented x3. Eyes: See eye exam associated with this date of surgery in media tab.  Scanned in by scanning center Ears, Nose, Throat: within normal limits Neck: Within Normal limits General: within normal limits Chest: Within normal limits Breast: deferred Heart: Within normal limits Abdomen: Within normal limits GU: deferred Extremities: within normal limits Skin: within normal limits  Assessment/Plan Proliferative diabetic with previous tractional retinal detachment treated with PPV and silicone  oil Plan: To [facility] for Pars plana vitrectomy with silicone oil removal left eye  [name], [name] 12/15/2011, 12:30 PM

## 2011-12-17 ENCOUNTER — Telehealth: Payer: Self-pay | Admitting: Internal Medicine

## 2011-12-17 MED ORDER — LOSARTAN POTASSIUM 25 MG PO TABS: 25.0000 mg | ORAL_TABLET | Freq: Every day | ORAL | Status: DC

## 2011-12-17 NOTE — Telephone Encounter (Signed)
Refill on Losartan 25 mg. [place]

## 2011-12-26 ENCOUNTER — Ambulatory Visit: Payer: Self-pay | Admitting: Cardiovascular Disease

## 2011-12-29 ENCOUNTER — Ambulatory Visit (INDEPENDENT_AMBULATORY_CARE_PROVIDER_SITE_OTHER): Payer: Self-pay | Admitting: Cardiovascular Disease

## 2011-12-29 ENCOUNTER — Encounter: Payer: Self-pay | Admitting: Cardiovascular Disease

## 2011-12-29 VITALS — BP 120/70 | HR 75 | Ht 67.0 in | Wt 179.2 lb

## 2011-12-29 DIAGNOSIS — E1121 Type 2 diabetes mellitus with diabetic nephropathy: Secondary | ICD-10-CM

## 2011-12-29 DIAGNOSIS — N058 Unspecified nephritic syndrome with other morphologic changes: Secondary | ICD-10-CM

## 2011-12-29 DIAGNOSIS — E1169 Type 2 diabetes mellitus with other specified complication: Secondary | ICD-10-CM

## 2011-12-29 DIAGNOSIS — I152 Hypertension secondary to endocrine disorders: Secondary | ICD-10-CM

## 2011-12-29 DIAGNOSIS — E1129 Type 2 diabetes mellitus with other diabetic kidney complication: Secondary | ICD-10-CM

## 2011-12-29 DIAGNOSIS — I519 Heart disease, unspecified: Secondary | ICD-10-CM

## 2011-12-29 DIAGNOSIS — N184 Chronic kidney disease, stage 4 (severe): Secondary | ICD-10-CM

## 2011-12-29 DIAGNOSIS — I10 Essential (primary) hypertension: Secondary | ICD-10-CM

## 2011-12-29 MED ORDER — FUROSEMIDE 40 MG PO TABS: 40.0000 mg | ORAL_TABLET | Freq: Every day | ORAL | Status: DC

## 2011-12-29 MED ORDER — CARVEDILOL 6.25 MG PO TABS: 6.2500 mg | ORAL_TABLET | Freq: Two times a day (BID) | ORAL | Status: DC

## 2011-12-29 NOTE — Assessment & Plan Note (Signed)
See note above. Would recommend a trial of lower Lasix as I am uncertain of whether she has a component of hypovolemia given the rise in her BUN and creatinine, last in April 2013. Significant worsening of her renal function in the past year.

## 2011-12-29 NOTE — Assessment & Plan Note (Signed)
Blood pressure is well controlled on today's visit. No changes made to the medications. 

## 2011-12-29 NOTE — Assessment & Plan Note (Signed)
Mild decrease in her ejection fraction on previous echocardiogram. No sign of systolic CHF on today's visit. In fact her creatinine has been rising concerning for hypovolemia. We have suggested she alternate Lasix 40 mg with 20 mg every other day. If she has worsening edema or shortness of breath, she could increase the Lasix back to 40 mg daily. If she feels well on a lower dose, perhaps she would tolerate Lasix 20 mg daily. This can be discussed with Dr. Gilford Rile or Dr. Candiss Norse in followup.

## 2011-12-29 NOTE — Progress Notes (Signed)
Patient ID: Theresa [name], female    DOB: 10/08/1955, 56 y.o.   MRN: [id number]  HPI Comments: Theresa [name] is a pleasant 56 year old woman with 25 years of smoking, diabetes, chronic renal insufficiency,  moderate to severe mitral valve regurgitation , pneumonia in June of 2012 with admission overnight to [facility] who presents by referral from Dr. Candiss Norse (renal) for evaluation of her ejection fraction which is 45% and mitral valve disease.   She reports that currently she feels well. She use to have edema in her legs though this has improved on Lasix daily. She has been maintained on Lasix 40 mg daily . Creatinine has been slowly increasing over the past year from previous baseline of 1.6 now up to creatinine of 3 . She sees Dr. Candiss Norse  of [facility] .She denies any shortness of breath, chest pain, lightheadedness or dizziness. Overall she feels well and has no complaints.  Echocardiogram on November 10, 2010 shows mildly dilated left ventricle, ejection fraction 45-50%, mild MS, moderate to severe mitral regurg, mild to moderate tricuspid regurg.   Labs from the hospital in June showed creatinine of 1.63, increasing to 1.78  EKG shows normal sinus rhythm with rate 75 beats per minute with nonspecific T wave abnormality in 1 and aVL   Outpatient Encounter Prescriptions as of 12/29/2011  Medication Sig Dispense Refill  . carvedilol (COREG) 6.25 MG tablet Take 1 tablet (6.25 mg total) by mouth 2 (two) times daily.  180 tablet  3  . furosemide (LASIX) 40 MG tablet Take 1 tablet (40 mg total) by mouth daily.  90 tablet  3  . gabapentin (NEURONTIN) 600 MG tablet Take 1 tablet (600 mg total) by mouth at bedtime.  90 tablet  2  . HYDROMORPHONE HCL PO Take by mouth as needed.      . insulin detemir (LEVEMIR FLEXPEN) 100 UNIT/ML injection Inject 30 Units into the skin daily.       Marland Kitchen losartan (COZAAR) 25 MG tablet Take 1 tablet (25 mg total) by mouth daily.  90 tablet  2  . simvastatin (ZOCOR) 40 MG tablet Take  40 mg by mouth at bedtime.         Review of Systems  Constitutional: Negative.   HENT: Negative.   Eyes: Negative.   Respiratory: Negative.   Cardiovascular: Negative.   Gastrointestinal: Negative.   Musculoskeletal: Negative.   Skin: Negative.   Neurological: Negative.   Hematological: Negative.   Psychiatric/Behavioral: Negative.   All other systems reviewed and are negative.   BP 120/70  Pulse 75  Ht 5\' 7"  (1.702 m)  Wt 179 lb 4 oz (81.307 kg)  BMI 28.07 kg/m2  Physical Exam  Nursing note and vitals reviewed. Constitutional: She is oriented to person, place, and time. She appears well-developed and well-nourished.  HENT:  Head: Normocephalic.  Nose: Nose normal.  Mouth/Throat: Oropharynx is clear and moist.  Eyes: Conjunctivae are normal. Pupils are equal, round, and reactive to light.  Neck: Normal range of motion. Neck supple. No JVD present. Carotid bruit is present.  Cardiovascular: Normal rate, regular rhythm, S1 normal, S2 normal and intact distal pulses.  Exam reveals no gallop and no friction rub.   Murmur heard.  Crescendo systolic murmur is present with a grade of 2/6  Pulmonary/Chest: Effort normal and breath sounds normal. No respiratory distress. She has no wheezes. She has no rales. She exhibits no tenderness.  Abdominal: Soft. Bowel sounds are normal. She exhibits no distension. There is  no tenderness.  Musculoskeletal: Normal range of motion. She exhibits no edema and no tenderness.  Lymphadenopathy:    She has no cervical adenopathy.  Neurological: She is alert and oriented to person, place, and time. Coordination normal.  Skin: Skin is warm and dry. No rash noted. No erythema.  Psychiatric: She has a normal mood and affect. Her behavior is normal. Judgment and thought content normal.         Assessment and Plan

## 2011-12-29 NOTE — Patient Instructions (Addendum)
You are doing well. As a trial, take a full lasix alternating with a half lasix (furosemide) If you get swelling or shortness of breath, go back up to full lasix daily  Please call us if you have new issues that need to be addressed before your next appt.  Your physician wants you to follow-up in: 6 months.  You will receive a reminder letter in the mail two months in advance. If you don't receive a letter, please call our office to schedule the follow-up appointment.

## 2011-12-29 NOTE — Assessment & Plan Note (Signed)
We have stressed to her the importance of weight loss and good eating habits. Hemoglobin A1c continues to be 8.

## 2012-01-07 ENCOUNTER — Encounter: Payer: Self-pay | Admitting: Internal Medicine

## 2012-01-07 ENCOUNTER — Other Ambulatory Visit: Payer: Self-pay | Admitting: Internal Medicine

## 2012-01-07 ENCOUNTER — Ambulatory Visit (INDEPENDENT_AMBULATORY_CARE_PROVIDER_SITE_OTHER): Payer: Self-pay | Admitting: Internal Medicine

## 2012-01-07 VITALS — BP 92/52 | HR 84 | Temp 99.2°F | Wt 169.2 lb

## 2012-01-07 DIAGNOSIS — I10 Essential (primary) hypertension: Secondary | ICD-10-CM

## 2012-01-07 DIAGNOSIS — E1169 Type 2 diabetes mellitus with other specified complication: Secondary | ICD-10-CM

## 2012-01-07 DIAGNOSIS — R112 Nausea with vomiting, unspecified: Secondary | ICD-10-CM | POA: Insufficient documentation

## 2012-01-07 DIAGNOSIS — N184 Chronic kidney disease, stage 4 (severe): Secondary | ICD-10-CM

## 2012-01-07 DIAGNOSIS — I152 Hypertension secondary to endocrine disorders: Secondary | ICD-10-CM

## 2012-01-07 LAB — POCT CBG (FASTING - GLUCOSE)-MANUAL ENTRY: Glucose Fasting, POC: 313 mg/dL — AB (ref 70–99)

## 2012-01-07 MED ORDER — ONDANSETRON HCL 8 MG PO TABS: 8.0000 mg | ORAL_TABLET | Freq: Three times a day (TID) | ORAL | Status: AC | PRN

## 2012-01-07 NOTE — Progress Notes (Signed)
Subjective:    Patient ID: Theresa [name], female    DOB: August 03, 1955, 56 y.o.   MRN: [id number]  HPI 56 year old female with history of hypertension, diabetes, chronic kidney disease presents for followup. She reports that over the last couple of weeks she has been feeling very poorly. She reports nausea with intermittent vomiting of phlegm. She reports subjective fever. She reports lack of appetite. She denies any abdominal pain, diarrhea. She denies any known sick contacts.  Outpatient Encounter Prescriptions as of 01/07/2012  Medication Sig Dispense Refill  . carvedilol (COREG) 6.25 MG tablet Take 1 tablet (6.25 mg total) by mouth 2 (two) times daily.  180 tablet  3  . furosemide (LASIX) 40 MG tablet Take 1 tablet (40 mg total) by mouth daily.  90 tablet  3  . gabapentin (NEURONTIN) 600 MG tablet Take 1 tablet (600 mg total) by mouth at bedtime.  90 tablet  2  . HYDROMORPHONE HCL PO Take by mouth as needed.      . insulin detemir (LEVEMIR FLEXPEN) 100 UNIT/ML injection Inject 30 Units into the skin daily.       Marland Kitchen losartan (COZAAR) 25 MG tablet Take 1 tablet (25 mg total) by mouth daily.  90 tablet  2  . simvastatin (ZOCOR) 40 MG tablet Take 40 mg by mouth at bedtime.        Marland Kitchen LASIX 20 MG tablet       . ondansetron (ZOFRAN) 8 MG tablet Take 1 tablet (8 mg total) by mouth every 8 (eight) hours as needed for nausea.  20 tablet  0  . REGLAN 5 MG tablet         Review of Systems  Constitutional: Positive for fever and fatigue. Negative for chills, appetite change and unexpected weight change.  HENT: Negative for ear pain, congestion, sore throat, trouble swallowing, neck pain, voice change and sinus pressure.   Eyes: Negative for visual disturbance.  Respiratory: Negative for cough, shortness of breath, wheezing and stridor.   Cardiovascular: Negative for chest pain, palpitations and leg swelling.  Gastrointestinal: Positive for nausea, vomiting and abdominal pain. Negative for diarrhea,  constipation, blood in stool, abdominal distention and anal bleeding.  Genitourinary: Negative for dysuria and flank pain.  Musculoskeletal: Negative for myalgias, arthralgias and gait problem.  Skin: Negative for color change and rash.  Neurological: Negative for dizziness and headaches.  Hematological: Negative for adenopathy. Does not bruise/bleed easily.  Psychiatric/Behavioral: Negative for suicidal ideas, disturbed wake/sleep cycle and dysphoric mood. The patient is not nervous/anxious.    BP 92/52  Pulse 84  Temp 99.2 F (37.3 C) (Oral)  Wt 169 lb 4 oz (76.771 kg)  SpO2 97%     Objective:   Physical Exam  Constitutional: She is oriented to person, place, and time. She appears well-developed and well-nourished. She has a sickly appearance. No distress.  HENT:  Head: Normocephalic and atraumatic.  Right Ear: External ear normal.  Left Ear: External ear normal.  Nose: Nose normal.  Mouth/Throat: Oropharynx is clear and moist. No oropharyngeal exudate.  Eyes: Conjunctivae are normal. Pupils are equal, round, and reactive to light. Right eye exhibits no discharge. Left eye exhibits no discharge. No scleral icterus.  Neck: Normal range of motion. Neck supple. No tracheal deviation present. No thyromegaly present.  Cardiovascular: Normal rate, regular rhythm, normal heart sounds and intact distal pulses.  Exam reveals no gallop and no friction rub.   No murmur heard. Pulmonary/Chest: Effort normal and breath sounds normal. No  respiratory distress. She has no wheezes. She has no rales. She exhibits no tenderness.  Musculoskeletal: Normal range of motion. She exhibits no edema and no tenderness.  Lymphadenopathy:    She has no cervical adenopathy.  Neurological: She is alert and oriented to person, place, and time. No cranial nerve deficit. She exhibits normal muscle tone. Coordination normal.  Skin: Skin is warm and dry. No rash noted. She is not diaphoretic. No erythema. No pallor.    Psychiatric: She has a normal mood and affect. Her behavior is normal. Judgment and thought content normal.          Assessment & Plan:

## 2012-01-07 NOTE — Assessment & Plan Note (Signed)
Blood pressure is low today, likely from hypovolemia. Encourage patient to increase fluid intake. Will check renal function with labs. She will followup tomorrow for recheck.

## 2012-01-07 NOTE — Assessment & Plan Note (Signed)
Patient with two-week history of nausea and vomiting. Question if this may be viral syndrome versus worsening of her stage IV chronic kidney disease. No focal findings on exam today. Will check electrolytes and renal function with labs. We'll also check blood counts. Encourage patient to increase intake of fluids, particularly fluids with electrolytes such as Gatorade. We'll use Zofran for nausea. Will bring her in for recheck tomorrow. If no improvement, may need admission for IV fluids.

## 2012-01-07 NOTE — Assessment & Plan Note (Signed)
Note worsening nausea and vomiting over the last 2 weeks. Question if this may be related to uremia. Will check stat CMP with labs today. Followup tomorrow.

## 2012-01-08 ENCOUNTER — Emergency Department: Payer: Self-pay | Admitting: Emergency Medicine

## 2012-01-08 ENCOUNTER — Ambulatory Visit (INDEPENDENT_AMBULATORY_CARE_PROVIDER_SITE_OTHER): Payer: Self-pay | Admitting: Internal Medicine

## 2012-01-08 ENCOUNTER — Encounter: Payer: Self-pay | Admitting: Internal Medicine

## 2012-01-08 VITALS — BP 82/54 | HR 71 | Temp 98.8°F | Wt 173.6 lb

## 2012-01-08 DIAGNOSIS — N184 Chronic kidney disease, stage 4 (severe): Secondary | ICD-10-CM

## 2012-01-08 DIAGNOSIS — R112 Nausea with vomiting, unspecified: Secondary | ICD-10-CM

## 2012-01-08 LAB — CBC
HCT: 28.8 % — ABNORMAL LOW (ref 34.0–46.6)
Hemoglobin: 9.5 g/dL — ABNORMAL LOW (ref 11.1–15.9)
MCH: 32.9 pg (ref 26.0–34.0)
MCH: 33.5 pg (ref 26.0–34.0)
MCHC: 33 g/dL (ref 31.5–35.7)
MCHC: 34.7 g/dL (ref 32.0–36.0)
MCHC: 35.5 g/dL (ref 32.0–36.0)
Platelet: 243 10*3/uL (ref 150–440)
Platelet: 217 10*3/uL (ref 150–440)
RBC: 2.99 x10E6/uL — ABNORMAL LOW (ref 3.77–5.28)
RBC: 2.39 10*6/uL — ABNORMAL LOW (ref 3.80–5.20)
RBC: 2.71 10*6/uL — ABNORMAL LOW (ref 3.80–5.20)
RDW: 12.5 % (ref 11.5–14.5)
WBC: 9.9 10*3/uL (ref 4.0–10.5)
WBC: 10.9 10*3/uL (ref 3.6–11.0)

## 2012-01-08 LAB — COMPREHENSIVE METABOLIC PANEL
ALT: 20 IU/L (ref 0–32)
AST: 18 IU/L (ref 0–40)
Albumin: 2.9 g/dL — ABNORMAL LOW (ref 3.4–5.0)
Alkaline Phosphatase: 119 U/L (ref 50–136)
Anion Gap: 10 (ref 7–16)
Bilirubin,Total: 0.2 mg/dL (ref 0.2–1.0)
CO2: 20 mmol/L (ref 19–28)
Calcium, Total: 8.7 mg/dL (ref 8.5–10.1)
Calcium: 8.9 mg/dL (ref 8.7–10.2)
Chloride: 100 mmol/L (ref 97–108)
Co2: 24 mmol/L (ref 21–32)
Creatinine: 3.85 mg/dL — ABNORMAL HIGH (ref 0.60–1.30)
GFR calc non Af Amer: 15 mL/min/{1.73_m2} — ABNORMAL LOW (ref >59)
Glucose: 287 mg/dL — ABNORMAL HIGH (ref 65–99)
Glucose: 251 mg/dL — ABNORMAL HIGH (ref 65–99)
Osmolality: 299 (ref 275–301)
Potassium: 4.4 mmol/L (ref 3.5–5.2)
Potassium: 4.3 mmol/L (ref 3.5–5.1)
Sodium: 134 mmol/L (ref 134–144)
Sodium: 140 mmol/L (ref 136–145)
Total Protein: 7 g/dL (ref 6.0–8.5)

## 2012-01-08 LAB — LIPASE, BLOOD: Lipase: 119 U/L (ref 73–393)

## 2012-01-08 LAB — PHOSPHORUS: Phosphorus: 4.6 mg/dL (ref 2.5–4.9)

## 2012-01-08 LAB — URINALYSIS, COMPLETE
Bilirubin,UR: NEGATIVE
Glucose,UR: 150 mg/dL (ref 0–75)
Ketone: NEGATIVE
Protein: >=500
RBC,UR: 3 /HPF (ref 0–5)
WBC UR: 2 /HPF (ref 0–5)

## 2012-01-08 LAB — MAGNESIUM: Magnesium: 2.3 mg/dL

## 2012-01-08 LAB — TROPONIN I: Troponin-I: <0.02 ng/mL

## 2012-01-08 LAB — PROTIME-INR: Prothrombin Time: 12.7 secs (ref 11.5–14.7)

## 2012-01-08 LAB — LIPASE: Lipase: 23 U/L (ref 0–59)

## 2012-01-08 LAB — CK TOTAL AND CKMB (NOT AT ARMC)
CK, Total: 109 U/L (ref 21–215)
CK-MB: 0.6 ng/mL (ref 0.5–3.6)

## 2012-01-08 NOTE — Progress Notes (Signed)
Subjective:    Patient ID: Theresa [name], female    DOB: May 26, 1956, 56 y.o.   MRN: [id number]  HPI  56 year old female with history of chronic kidney disease stage IV and diabetes mellitus presents for a one-day followup after being seen yesterday with a two-week history of nausea and vomiting. Lab work overnight showed claiming kidney function with creatinine of 3.3 and GFR 15. Patient reports that she was unable to afford Zofran tablets. Overnight, she continued to have nausea, vomiting and is now having diarrhea. She has been lightheaded. She denies any fever, chills, or abdominal pain.  Outpatient Encounter Prescriptions as of 01/08/2012  Medication Sig Dispense Refill  . carvedilol (COREG) 6.25 MG tablet Take 1 tablet (6.25 mg total) by mouth 2 (two) times daily.  180 tablet  3  . furosemide (LASIX) 40 MG tablet Take 1 tablet (40 mg total) by mouth daily.  90 tablet  3  . gabapentin (NEURONTIN) 600 MG tablet Take 1 tablet (600 mg total) by mouth at bedtime.  90 tablet  2  . HYDROcodone-acetaminophen (VICODIN) 5-500 MG per tablet Take 1 tablet by mouth every 8 (eight) hours as needed. For pain      . insulin detemir (LEVEMIR FLEXPEN) 100 UNIT/ML injection Inject 30 Units into the skin daily.       Marland Kitchen losartan (COZAAR) 25 MG tablet Take 1 tablet (25 mg total) by mouth daily.  90 tablet  2  . ondansetron (ZOFRAN) 8 MG tablet Take 1 tablet (8 mg total) by mouth every 8 (eight) hours as needed for nausea.  20 tablet  0  . simvastatin (ZOCOR) 40 MG tablet Take 40 mg by mouth at bedtime.         BP 82/54  Pulse 71  Temp 98.8 F (37.1 C) (Oral)  Wt 173 lb 9 oz (78.727 kg)  SpO2 95%   Review of Systems  Constitutional: Positive for fatigue. Negative for fever, chills, appetite change and unexpected weight change.  HENT: Negative for ear pain, congestion, sore throat, trouble swallowing, neck pain, voice change and sinus pressure.   Eyes: Negative for visual disturbance.  Respiratory:  Negative for cough, shortness of breath, wheezing and stridor.   Cardiovascular: Negative for chest pain, palpitations and leg swelling.  Gastrointestinal: Positive for nausea, vomiting and diarrhea. Negative for abdominal pain, constipation, blood in stool, abdominal distention and anal bleeding.  Genitourinary: Negative for dysuria and flank pain.  Musculoskeletal: Negative for myalgias, arthralgias and gait problem.  Skin: Negative for color change and rash.  Neurological: Positive for weakness and light-headedness. Negative for dizziness and headaches.  Hematological: Negative for adenopathy. Does not bruise/bleed easily.  Psychiatric/Behavioral: Negative for suicidal ideas, disturbed wake/sleep cycle and dysphoric mood. The patient is not nervous/anxious.        Objective:   Physical Exam  Constitutional: She is oriented to person, place, and time. She appears well-developed and well-nourished. She has a sickly appearance. No distress.  HENT:  Head: Normocephalic and atraumatic.  Right Ear: External ear normal.  Left Ear: External ear normal.  Nose: Nose normal.  Mouth/Throat: Oropharynx is clear and moist. No oropharyngeal exudate.  Eyes: Conjunctivae are normal. Pupils are equal, round, and reactive to light. Right eye exhibits no discharge. Left eye exhibits no discharge. No scleral icterus.  Neck: Normal range of motion. Neck supple. No tracheal deviation present. No thyromegaly present.  Cardiovascular: Normal rate, regular rhythm, normal heart sounds and intact distal pulses.  Exam reveals no gallop and  no friction rub.   No murmur heard. Pulmonary/Chest: Effort normal and breath sounds normal. No respiratory distress. She has no wheezes. She has no rales. She exhibits no tenderness.  Abdominal: Soft. Bowel sounds are normal. She exhibits no distension and no mass. There is no tenderness. There is no guarding.  Musculoskeletal: Normal range of motion. She exhibits no edema and  no tenderness.  Lymphadenopathy:    She has no cervical adenopathy.  Neurological: She is alert and oriented to person, place, and time. No cranial nerve deficit. She exhibits normal muscle tone. Coordination normal.  Skin: Skin is warm and dry. No rash noted. She is not diaphoretic. No erythema. No pallor.  Psychiatric: She has a normal mood and affect. Her behavior is normal. Judgment and thought content normal.          Assessment & Plan:

## 2012-01-08 NOTE — Assessment & Plan Note (Signed)
Mark declining kidney function with GFR 15 and creatinine 3.3. Symptoms secondary to dehydration. Discussed with patient's nephrologist. Will send to [facility] emergency room for IV fluids and admission.

## 2012-01-08 NOTE — Assessment & Plan Note (Signed)
Symptoms persistent and leading to dehydration and acute renal failure. Will send to ER for IV fluids, antiemetics and admission.

## 2012-01-13 ENCOUNTER — Ambulatory Visit (INDEPENDENT_AMBULATORY_CARE_PROVIDER_SITE_OTHER): Payer: Self-pay | Admitting: Internal Medicine

## 2012-01-13 ENCOUNTER — Encounter: Payer: Self-pay | Admitting: Internal Medicine

## 2012-01-13 ENCOUNTER — Ambulatory Visit (INDEPENDENT_AMBULATORY_CARE_PROVIDER_SITE_OTHER)
Admission: RE | Admit: 2012-01-13 | Discharge: 2012-01-13 | Disposition: A | Discharge status: Home / Self Care | Payer: Self-pay | Source: Ambulatory Visit | Attending: Internal Medicine | Admitting: Internal Medicine

## 2012-01-13 VITALS — BP 110/60 | HR 87 | Temp 99.2°F | Wt 177.0 lb

## 2012-01-13 DIAGNOSIS — R05 Cough: Secondary | ICD-10-CM | POA: Insufficient documentation

## 2012-01-13 DIAGNOSIS — R059 Cough, unspecified: Secondary | ICD-10-CM

## 2012-01-13 DIAGNOSIS — N184 Chronic kidney disease, stage 4 (severe): Secondary | ICD-10-CM

## 2012-01-13 LAB — COMPREHENSIVE METABOLIC PANEL
Albumin: 3.3 g/dL — ABNORMAL LOW (ref 3.5–5.2)
BUN: 34 mg/dL — ABNORMAL HIGH (ref 6–23)
CO2: 21 mEq/L (ref 19–32)
GFR: 22.06 mL/min — ABNORMAL LOW (ref >60.00)
Glucose, Bld: 225 mg/dL — ABNORMAL HIGH (ref 70–99)
Sodium: 138 mEq/L (ref 135–145)
Total Bilirubin: 0.4 mg/dL (ref 0.3–1.2)
Total Protein: 7.2 g/dL (ref 6.0–8.3)

## 2012-01-13 LAB — CBC WITH DIFFERENTIAL/PLATELET
Basophils Relative: 0.5 % (ref 0.0–3.0)
Eosinophils Relative: 1.3 % (ref 0.0–5.0)
HCT: 29.1 % — ABNORMAL LOW (ref 36.0–46.0)
Hemoglobin: 9.5 g/dL — ABNORMAL LOW (ref 12.0–15.0)
Lymphs Abs: 2.5 10*3/uL (ref 0.7–4.0)
MCV: 99 fl (ref 78.0–100.0)
Monocytes Relative: 3.4 % (ref 3.0–12.0)
Platelets: 331 10*3/uL (ref 150.0–400.0)
RBC: 2.94 Mil/uL — ABNORMAL LOW (ref 3.87–5.11)
WBC: 11.9 10*3/uL — ABNORMAL HIGH (ref 4.5–10.5)

## 2012-01-13 MED ORDER — LEVOFLOXACIN 250 MG PO TABS: 250.0000 mg | ORAL_TABLET | Freq: Every day | ORAL | Status: DC

## 2012-01-13 NOTE — Progress Notes (Signed)
Subjective:    Patient ID: Theresa [name], female    DOB: 28-Nov-1955, 56 y.o.   MRN: [id number]  HPI 56 year old female with history of chronic kidney disease stage IV, diabetes, hypertension, and recent episode of nausea, vomiting, diarrhea and dehydration presents for followup. She reports she is feeling better than last week. She is able to tolerate by mouth liquids. Her diarrhea has nearly resolved. She denies any further nausea or vomiting. She does note some cough with purulent sputum over the last couple days. She denies any chest pain or shortness of breath.  Outpatient Encounter Prescriptions as of 01/13/2012  Medication Sig Dispense Refill  . carvedilol (COREG) 6.25 MG tablet Take 1 tablet (6.25 mg total) by mouth 2 (two) times daily.  180 tablet  3  . furosemide (LASIX) 40 MG tablet Take 1 tablet (40 mg total) by mouth daily.  90 tablet  3  . gabapentin (NEURONTIN) 600 MG tablet Take 1 tablet (600 mg total) by mouth at bedtime.  90 tablet  2  . HYDROcodone-acetaminophen (VICODIN) 5-500 MG per tablet Take 1 tablet by mouth every 8 (eight) hours as needed. For pain      . insulin detemir (LEVEMIR FLEXPEN) 100 UNIT/ML injection Inject 30 Units into the skin daily.       Marland Kitchen levofloxacin (LEVAQUIN) 250 MG tablet Take 1 tablet (250 mg total) by mouth daily.  7 tablet  0  . losartan (COZAAR) 25 MG tablet Take 1 tablet (25 mg total) by mouth daily.  90 tablet  2  . ondansetron (ZOFRAN) 8 MG tablet Take 1 tablet (8 mg total) by mouth every 8 (eight) hours as needed for nausea.  20 tablet  0  . simvastatin (ZOCOR) 40 MG tablet Take 40 mg by mouth at bedtime.         BP 110/60  Pulse 87  Temp 99.2 F (37.3 C) (Oral)  Wt 177 lb (80.287 kg)  SpO2 97%  Review of Systems  Constitutional: Negative for fever, chills, appetite change, fatigue and unexpected weight change.  HENT: Negative for ear pain, congestion, sore throat, trouble swallowing, neck pain, voice change and sinus pressure.   Eyes:  Negative for visual disturbance.  Respiratory: Positive for cough. Negative for shortness of breath, wheezing and stridor.   Cardiovascular: Negative for chest pain, palpitations and leg swelling.  Gastrointestinal: Positive for nausea, vomiting and diarrhea. Negative for abdominal pain, constipation, blood in stool, abdominal distention and anal bleeding.  Genitourinary: Negative for dysuria and flank pain.  Musculoskeletal: Negative for myalgias, arthralgias and gait problem.  Skin: Negative for color change and rash.  Neurological: Negative for dizziness and headaches.  Hematological: Negative for adenopathy. Does not bruise/bleed easily.  Psychiatric/Behavioral: Negative for suicidal ideas, disturbed wake/sleep cycle and dysphoric mood. The patient is not nervous/anxious.        Objective:   Physical Exam  Constitutional: She is oriented to person, place, and time. She appears well-developed and well-nourished. No distress.  HENT:  Head: Normocephalic and atraumatic.  Right Ear: External ear normal.  Left Ear: External ear normal.  Nose: Nose normal.  Mouth/Throat: Oropharynx is clear and moist. No oropharyngeal exudate.  Eyes: Conjunctivae are normal. Pupils are equal, round, and reactive to light. Right eye exhibits no discharge. Left eye exhibits no discharge. No scleral icterus.  Neck: Normal range of motion. Neck supple. No tracheal deviation present. No thyromegaly present.  Cardiovascular: Normal rate, regular rhythm, normal heart sounds and intact distal pulses.  Exam  reveals no gallop and no friction rub.   No murmur heard. Pulmonary/Chest: Effort normal and breath sounds normal. No respiratory distress. She has no wheezes. She has no rales. She exhibits no tenderness.  Abdominal: Soft. Bowel sounds are normal.  Musculoskeletal: Normal range of motion. She exhibits no edema and no tenderness.  Lymphadenopathy:    She has no cervical adenopathy.  Neurological: She is alert  and oriented to person, place, and time. No cranial nerve deficit. She exhibits normal muscle tone. Coordination normal.  Skin: Skin is warm and dry. No rash noted. She is not diaphoretic. No erythema. No pallor.  Psychiatric: She has a normal mood and affect. Her behavior is normal. Judgment and thought content normal.          Assessment & Plan:

## 2012-01-13 NOTE — Assessment & Plan Note (Signed)
Recent worsening secondary to dehydration in the setting of vomiting and diarrhea. Will repeat renal function with labs today.

## 2012-01-13 NOTE — Assessment & Plan Note (Signed)
Several day history of cough. Exam is normal, except that patient has fever. Chest x-ray today showed no acute infiltrate. Likely viral infection. However, will treat empirically with Levaquin. Advised her to delay elective surgery scheduled for Tuesday. Followup here one week.

## 2012-01-20 ENCOUNTER — Ambulatory Visit (HOSPITAL_COMMUNITY): Admission: RE | Admit: 2012-01-20 | Payer: Self-pay | Source: Ambulatory Visit | Admitting: Ophthalmology

## 2012-01-20 ENCOUNTER — Encounter (HOSPITAL_COMMUNITY): Admission: RE | Payer: Self-pay | Source: Ambulatory Visit

## 2012-01-20 ENCOUNTER — Encounter (INDEPENDENT_AMBULATORY_CARE_PROVIDER_SITE_OTHER): Payer: Self-pay | Admitting: Ophthalmology

## 2012-01-20 SURGERY — PARS PLANA VITRECTOMY WITH 25 GAUGE
Anesthesia: General | Laterality: Left

## 2012-01-21 ENCOUNTER — Encounter: Payer: Self-pay | Admitting: Internal Medicine

## 2012-01-21 ENCOUNTER — Ambulatory Visit (INDEPENDENT_AMBULATORY_CARE_PROVIDER_SITE_OTHER): Payer: Self-pay | Admitting: Internal Medicine

## 2012-01-21 VITALS — BP 140/78 | HR 72 | Temp 98.6°F | Wt 176.2 lb

## 2012-01-21 DIAGNOSIS — R05 Cough: Secondary | ICD-10-CM

## 2012-01-21 DIAGNOSIS — E785 Hyperlipidemia, unspecified: Secondary | ICD-10-CM

## 2012-01-21 DIAGNOSIS — E1121 Type 2 diabetes mellitus with diabetic nephropathy: Secondary | ICD-10-CM

## 2012-01-21 DIAGNOSIS — R059 Cough, unspecified: Secondary | ICD-10-CM

## 2012-01-21 DIAGNOSIS — N058 Unspecified nephritic syndrome with other morphologic changes: Secondary | ICD-10-CM

## 2012-01-21 DIAGNOSIS — E1129 Type 2 diabetes mellitus with other diabetic kidney complication: Secondary | ICD-10-CM

## 2012-01-21 MED ORDER — SIMVASTATIN 40 MG PO TABS: 40.0000 mg | ORAL_TABLET | Freq: Every day | ORAL | Status: DC

## 2012-01-21 NOTE — Assessment & Plan Note (Signed)
Symptoms markedly improved after treatment with antibiotics. Chest x-ray normal. Exam normal today. Letter written patient is okay to proceed with surgery.

## 2012-01-21 NOTE — Progress Notes (Signed)
Subjective:    Patient ID: Theresa [name], female    DOB: 02-23-1956, 56 y.o.   MRN: [id number]  HPI 56 year old female with history of chronic kidney disease stage IV, diabetes mellitus, and hypertension presents for followup after recent episode of cough. She initially became ill with symptoms of vomiting and diarrhea about 3 weeks ago. She was hospitalized and required IV fluids and blood transfusion. After discharge, she developed a cough with fever. Chest x-ray performed was normal. She was started on Levaquin. She reports significant improvement in her cough over the last week. She denies any recurrent fever, chills, chest pain. Next  In regards to her diabetes mellitus, she reports blood sugars have been well-controlled, typically between 150 and 180. She reports full compliance with her Levemir.  Outpatient Encounter Prescriptions as of 01/21/2012  Medication Sig Dispense Refill  . carvedilol (COREG) 6.25 MG tablet Take 1 tablet (6.25 mg total) by mouth 2 (two) times daily.  180 tablet  3  . furosemide (LASIX) 40 MG tablet Take 1 tablet (40 mg total) by mouth daily.  90 tablet  3  . gabapentin (NEURONTIN) 600 MG tablet Take 1 tablet (600 mg total) by mouth at bedtime.  90 tablet  2  . HYDROcodone-acetaminophen (VICODIN) 5-500 MG per tablet Take 1 tablet by mouth every 8 (eight) hours as needed. For pain      . insulin detemir (LEVEMIR FLEXPEN) 100 UNIT/ML injection Inject 30 Units into the skin daily.       Marland Kitchen losartan (COZAAR) 25 MG tablet Take 1 tablet (25 mg total) by mouth daily.  90 tablet  2  . simvastatin (ZOCOR) 40 MG tablet Take 1 tablet (40 mg total) by mouth at bedtime.  30 tablet  6  . DISCONTD: levofloxacin (LEVAQUIN) 250 MG tablet Take 1 tablet (250 mg total) by mouth daily.  7 tablet  0  . DISCONTD: simvastatin (ZOCOR) 40 MG tablet Take 40 mg by mouth at bedtime.         BP 140/78  Pulse 72  Temp 98.6 F (37 C) (Oral)  Wt 176 lb 4 oz (79.946 kg)  SpO2 98%  Review of  Systems  Constitutional: Negative for fever, chills, appetite change, fatigue and unexpected weight change.  HENT: Negative for ear pain, congestion, sore throat, trouble swallowing, neck pain, voice change and sinus pressure.   Eyes: Negative for visual disturbance.  Respiratory: Negative for cough, shortness of breath, wheezing and stridor.   Cardiovascular: Negative for chest pain, palpitations and leg swelling.  Gastrointestinal: Negative for nausea, vomiting, abdominal pain, diarrhea, constipation, blood in stool, abdominal distention and anal bleeding.  Genitourinary: Negative for dysuria and flank pain.  Musculoskeletal: Negative for myalgias, arthralgias and gait problem.  Skin: Negative for color change and rash.  Neurological: Negative for dizziness and headaches.  Hematological: Negative for adenopathy. Does not bruise/bleed easily.  Psychiatric/Behavioral: Negative for suicidal ideas, disturbed wake/sleep cycle and dysphoric mood. The patient is not nervous/anxious.        Objective:   Physical Exam  Constitutional: She is oriented to person, place, and time. She appears well-developed and well-nourished. No distress.  HENT:  Head: Normocephalic and atraumatic.  Right Ear: External ear normal.  Left Ear: External ear normal.  Nose: Nose normal.  Mouth/Throat: Oropharynx is clear and moist. No oropharyngeal exudate.  Eyes: Conjunctivae are normal. Pupils are equal, round, and reactive to light. Right eye exhibits no discharge. Left eye exhibits no discharge. No scleral icterus.  Neck:  Normal range of motion. Neck supple. No tracheal deviation present. No thyromegaly present.  Cardiovascular: Normal rate, regular rhythm, normal heart sounds and intact distal pulses.  Exam reveals no gallop and no friction rub.   No murmur heard. Pulmonary/Chest: Effort normal and breath sounds normal. No respiratory distress. She has no wheezes. She has no rales. She exhibits no tenderness.    Musculoskeletal: Normal range of motion. She exhibits no edema and no tenderness.  Lymphadenopathy:    She has no cervical adenopathy.  Neurological: She is alert and oriented to person, place, and time. No cranial nerve deficit. She exhibits normal muscle tone. Coordination normal.  Skin: Skin is warm and dry. No rash noted. She is not diaphoretic. No erythema. No pallor.  Psychiatric: She has a normal mood and affect. Her behavior is normal. Judgment and thought content normal.          Assessment & Plan:

## 2012-01-21 NOTE — Assessment & Plan Note (Signed)
Blood sugars recently fairly well controlled. Last A1c 6.5%. Will continue Levemir. Samples given today.

## 2012-01-26 NOTE — H&P (Signed)
Theresa [name] is an 56 y.o. female.   Chief Complaint: Silicone oil in eye since retinal surgery to reattach retina OS. HPI: Has silicone oil in eye and no longer needs the tamponade.  Past Medical History  Diagnosis Date  . Hypertension   . Hyperlipidemia   . COPD (chronic obstructive pulmonary disease)   . Emphysema   . Shortness of breath     more with exertion  . Pneumonia     June 2012  . Bronchitis     Mar 2012  . Peripheral vascular disease   . Varicose vein   . Edema extremities     legs occasionally  . Chronic constipation   . Colon polyps   . Sickle cell trait   . Anemia   . Diabetes mellitus   . Heart murmur   . Chronic kidney disease     Past Surgical History  Procedure Date  . Abdominal hysterectomy     2000  . Dilation and curettage of uterus     several in the early 80's  . Eye surgery     bilateral laser 2012  . Esophagogastroduodenoscopy     2012  . Colonscopy   . Tubal ligation     1979  . Pars plana vitrectomy 04/22/2011    Procedure: PARS PLANA VITRECTOMY WITH 25 GAUGE;  Surgeon: [name], MD;  Location: [place];  Service: Ophthalmology;  Laterality: Left;  membrane peel, endolaser, gas fluid exchange, silicone oil, repair of complex traction retinal detachment  . Pars plana vitrectomy 09/30/2011    Procedure: PARS PLANA VITRECTOMY WITH 25 GAUGE;  Surgeon: [name], MD;  Location: [place];  Service: Ophthalmology;  Laterality: Right;  Endolaser; Repair of Complex Traction Retinal Detachment  . Gas/fluid exchange 09/30/2011    Procedure: GAS/FLUID EXCHANGE;  Surgeon: [name], MD;  Location: [place];  Service: Ophthalmology;  Laterality: Right;  . Gas insertion 09/30/2011    Procedure: INSERTION OF GAS;  Surgeon: [name], MD;  Location: [place];  Service: Ophthalmology;  Laterality: Right;  [id number]  . Eye surgery     right    Family History  Problem Relation Age of Onset  . Anesthesia problems Neg Hx   . Hypotension Neg Hx     . Malignant hyperthermia Neg Hx   . Pseudochol deficiency Neg Hx   . Stroke Mother   . Heart attack Mother   . Heart disease Mother   . Colon cancer Father   . Colon cancer Sister   . Heart attack Brother   . Stroke Brother   . Diabetes Brother   . Breast cancer Sister   . Diabetes Brother   . Diabetes Daughter    Social History:  reports that she quit smoking about 19 months ago. She does not have any smokeless tobacco history on file. She reports that she does not drink alcohol or use illicit drugs.  Allergies: No Known Allergies  No prescriptions prior to admission    Review of systems otherwise negative  There were no vitals taken for this visit.  Physical exam: Mental status: oriented x3. Eyes: See eye exam associated with this date of surgery in media tab.  Scanned in by scanning center Ears, Nose, Throat: within normal limits Neck: Within Normal limits General: within normal limits Chest: Within normal limits Breast: deferred Heart: Within normal limits Abdomen: Within normal limits GU: deferred Extremities: within normal limits Skin: within normal limits  Assessment/Plan Full  reattachment of retina with no further need for silicone oil in eye Left eye Plan: To [facility] for Pars plana vitrectomy, gas injection left eye.  [name] 01/26/2012, 5:50 PM

## 2012-02-10 ENCOUNTER — Encounter (HOSPITAL_COMMUNITY): Payer: Self-pay | Admitting: Pharmacy Technician

## 2012-02-18 ENCOUNTER — Encounter: Payer: Self-pay | Admitting: Internal Medicine

## 2012-02-18 ENCOUNTER — Ambulatory Visit (INDEPENDENT_AMBULATORY_CARE_PROVIDER_SITE_OTHER): Payer: Self-pay | Admitting: Internal Medicine

## 2012-02-18 VITALS — BP 110/70 | HR 78 | Temp 98.2°F | Ht 67.5 in | Wt 179.5 lb

## 2012-02-18 DIAGNOSIS — E1129 Type 2 diabetes mellitus with other diabetic kidney complication: Secondary | ICD-10-CM

## 2012-02-18 DIAGNOSIS — R110 Nausea: Secondary | ICD-10-CM | POA: Insufficient documentation

## 2012-02-18 DIAGNOSIS — N189 Chronic kidney disease, unspecified: Secondary | ICD-10-CM | POA: Insufficient documentation

## 2012-02-18 DIAGNOSIS — E1142 Type 2 diabetes mellitus with diabetic polyneuropathy: Secondary | ICD-10-CM

## 2012-02-18 DIAGNOSIS — E785 Hyperlipidemia, unspecified: Secondary | ICD-10-CM | POA: Insufficient documentation

## 2012-02-18 DIAGNOSIS — E1140 Type 2 diabetes mellitus with diabetic neuropathy, unspecified: Secondary | ICD-10-CM | POA: Insufficient documentation

## 2012-02-18 DIAGNOSIS — D631 Anemia in chronic kidney disease: Secondary | ICD-10-CM | POA: Insufficient documentation

## 2012-02-18 DIAGNOSIS — N058 Unspecified nephritic syndrome with other morphologic changes: Secondary | ICD-10-CM

## 2012-02-18 DIAGNOSIS — E1149 Type 2 diabetes mellitus with other diabetic neurological complication: Secondary | ICD-10-CM

## 2012-02-18 DIAGNOSIS — N184 Chronic kidney disease, stage 4 (severe): Secondary | ICD-10-CM

## 2012-02-18 DIAGNOSIS — D649 Anemia, unspecified: Secondary | ICD-10-CM

## 2012-02-18 DIAGNOSIS — E1121 Type 2 diabetes mellitus with diabetic nephropathy: Secondary | ICD-10-CM

## 2012-02-18 MED ORDER — SIMVASTATIN 40 MG PO TABS: 40.0000 mg | ORAL_TABLET | Freq: Every day | ORAL | Status: DC

## 2012-02-18 MED ORDER — GABAPENTIN 800 MG PO TABS: 800.0000 mg | ORAL_TABLET | Freq: Two times a day (BID) | ORAL | Status: DC

## 2012-02-18 NOTE — Assessment & Plan Note (Signed)
Likely secondary to gastroparesis from diabetes. Will check A1c with labs today. Will check lipase with labs. We'll continue Zofran as needed.

## 2012-02-18 NOTE — Assessment & Plan Note (Signed)
Will try increasing dose of Neurontin to 800 mg twice daily. Followup one month. If no improvement, will consider switch to Lyrica.

## 2012-02-18 NOTE — Assessment & Plan Note (Signed)
We'll recheck renal function with labs today.

## 2012-02-18 NOTE — Assessment & Plan Note (Signed)
Will check A1c with labs today. Continue Levemir. Samples given today. Followup one month.

## 2012-02-18 NOTE — Assessment & Plan Note (Signed)
Will check lipids and LFTs with labs today. 

## 2012-02-18 NOTE — [place] Notes (Signed)
Subjective:    Patient ID: Theresa [name], female    DOB: Nov 04, 1955, 56 y.o.   MRN: [id number]  HPI  56 year old female with history of chronic kidney disease, hypertension, diabetes presents for followup. She reports ongoing issues with nausea. She is using Zofran with some improvement. She has not had any vomiting, fever, abdominal pain. In regards to her diabetes, she reports blood sugars have been well-controlled. She reports full compliance of Levemir. She did not bring a record of her blood sugars today. She notes that the burning in her lower legs has been poorly controlled with Neurontin. She questions whether dose may be increased to help with symptoms.  Outpatient Encounter Prescriptions as of 02/18/2012  Medication Sig Dispense Refill  . carvedilol (COREG) 6.25 MG tablet Take 6.25 mg by mouth 2 (two) times daily.      . furosemide (LASIX) 40 MG tablet Take 40 mg by mouth daily.      Marland Kitchen gabapentin (NEURONTIN) 800 MG tablet Take 1 tablet (800 mg total) by mouth 2 (two) times daily.  60 tablet  6  . HYDROcodone-acetaminophen (VICODIN) 5-500 MG per tablet Take 1 tablet by mouth every 8 (eight) hours as needed. For pain      . insulin detemir (LEVEMIR FLEXPEN) 100 UNIT/ML injection Inject 30 Units into the skin daily.       Marland Kitchen losartan (COZAAR) 25 MG tablet Take 25 mg by mouth daily.      . simvastatin (ZOCOR) 40 MG tablet Take 1 tablet (40 mg total) by mouth at bedtime.  30 tablet  6   BP 110/70  Pulse 78  Temp 98.2 F (36.8 C) (Oral)  Ht 5' 7.5" (1.715 m)  Wt 179 lb 8 oz (81.421 kg)  BMI 27.70 kg/m2  SpO2 98%  Review of Systems  Constitutional: Negative for fever, chills, appetite change, fatigue and unexpected weight change.  HENT: Negative for ear pain, congestion, sore throat, trouble swallowing, neck pain, voice change and sinus pressure.   Eyes: Negative for visual disturbance.  Respiratory: Negative for cough, shortness of breath, wheezing and stridor.   Cardiovascular:  Negative for chest pain, palpitations and leg swelling.  Gastrointestinal: Positive for nausea. Negative for vomiting, abdominal pain, diarrhea, constipation, blood in stool, abdominal distention and anal bleeding.  Genitourinary: Negative for dysuria and flank pain.  Musculoskeletal: Negative for myalgias, arthralgias and gait problem.  Skin: Negative for color change and rash.  Neurological: Negative for dizziness and headaches.  Hematological: Negative for adenopathy. Does not bruise/bleed easily.  Psychiatric/Behavioral: Negative for suicidal ideas, disturbed wake/sleep cycle and dysphoric mood. The patient is not nervous/anxious.        Objective:   Physical Exam  Constitutional: She is oriented to person, place, and time. She appears well-developed and well-nourished. No distress.  HENT:  Head: Normocephalic and atraumatic.  Right Ear: External ear normal.  Left Ear: External ear normal.  Nose: Nose normal.  Mouth/Throat: Oropharynx is clear and moist. No oropharyngeal exudate.  Eyes: Conjunctivae normal are normal. Pupils are equal, round, and reactive to light. Right eye exhibits no discharge. Left eye exhibits no discharge. No scleral icterus.  Neck: Normal range of motion. Neck supple. No tracheal deviation present. No thyromegaly present.  Cardiovascular: Normal rate, regular rhythm, normal heart sounds and intact distal pulses.  Exam reveals no gallop and no friction rub.   No murmur heard. Pulmonary/Chest: Effort normal and breath sounds normal. No respiratory distress. She has no wheezes. She has no rales. She  exhibits no tenderness.  Abdominal: Soft. Bowel sounds are normal. She exhibits no distension and no mass. There is no tenderness. There is no guarding.  Musculoskeletal: Normal range of motion. She exhibits no edema and no tenderness.  Lymphadenopathy:    She has no cervical adenopathy.  Neurological: She is alert and oriented to person, place, and time. No cranial  nerve deficit. She exhibits normal muscle tone. Coordination normal.  Skin: Skin is warm and dry. No rash noted. She is not diaphoretic. No erythema. No pallor.  Psychiatric: She has a normal mood and affect. Her behavior is normal. Judgment and thought content normal.          Assessment & Plan:

## 2012-02-18 NOTE — Assessment & Plan Note (Signed)
Multifactorial, with chronic kidney disease contributing. Will check CBC with labs.

## 2012-02-19 LAB — CBC WITH DIFFERENTIAL/PLATELET
Basophils Absolute: 0.1 10*3/uL (ref 0.0–0.1)
Eosinophils Absolute: 0.3 10*3/uL (ref 0.0–0.7)
HCT: 26.1 % — ABNORMAL LOW (ref 36.0–46.0)
Lymphs Abs: 2.7 10*3/uL (ref 0.7–4.0)
MCHC: 34.9 g/dL (ref 30.0–36.0)
MCV: 99.9 fl (ref 78.0–100.0)
Monocytes Absolute: 0.3 10*3/uL (ref 0.1–1.0)
Monocytes Relative: 2.7 % — ABNORMAL LOW (ref 3.0–12.0)
Platelets: 244 10*3/uL (ref 150.0–400.0)
RDW: 13.3 % (ref 11.5–14.6)

## 2012-02-19 LAB — COMPREHENSIVE METABOLIC PANEL
ALT: 27 U/L (ref 0–35)
AST: 20 U/L (ref 0–37)
Alkaline Phosphatase: 98 U/L (ref 39–117)
Sodium: 138 mEq/L (ref 135–145)
Total Bilirubin: 0.4 mg/dL (ref 0.3–1.2)
Total Protein: 7.2 g/dL (ref 6.0–8.3)

## 2012-02-23 ENCOUNTER — Encounter (HOSPITAL_COMMUNITY): Payer: Self-pay

## 2012-02-23 MED ORDER — CEFAZOLIN SODIUM-DEXTROSE 2-3 GM-% IV SOLR
2.0000 g | INTRAVENOUS | Status: DC
  Filled 2012-02-23: qty 50

## 2012-02-24 ENCOUNTER — Encounter (HOSPITAL_COMMUNITY): Payer: Self-pay | Admitting: Anesthesiology

## 2012-02-24 ENCOUNTER — Ambulatory Visit (HOSPITAL_COMMUNITY)
Admission: RE | Admit: 2012-02-24 | Discharge: 2012-02-25 | Disposition: A | Discharge status: Home / Self Care | Payer: Self-pay | Source: Ambulatory Visit | Attending: Ophthalmology | Admitting: Ophthalmology

## 2012-02-24 ENCOUNTER — Encounter (HOSPITAL_COMMUNITY)
Admission: RE | Disposition: A | Discharge status: Home / Self Care | Payer: Self-pay | Source: Ambulatory Visit | Attending: Ophthalmology

## 2012-02-24 ENCOUNTER — Encounter (INDEPENDENT_AMBULATORY_CARE_PROVIDER_SITE_OTHER): Payer: Self-pay | Admitting: Ophthalmology

## 2012-02-24 ENCOUNTER — Ambulatory Visit (HOSPITAL_COMMUNITY): Payer: Self-pay | Admitting: Anesthesiology

## 2012-02-24 DIAGNOSIS — J449 Chronic obstructive pulmonary disease, unspecified: Secondary | ICD-10-CM | POA: Insufficient documentation

## 2012-02-24 DIAGNOSIS — E1139 Type 2 diabetes mellitus with other diabetic ophthalmic complication: Secondary | ICD-10-CM

## 2012-02-24 DIAGNOSIS — E113593 Type 2 diabetes mellitus with proliferative diabetic retinopathy without macular edema, bilateral: Secondary | ICD-10-CM

## 2012-02-24 DIAGNOSIS — S0550XA Penetrating wound with foreign body of unspecified eyeball, initial encounter: Secondary | ICD-10-CM | POA: Insufficient documentation

## 2012-02-24 DIAGNOSIS — Z1889 Other specified retained foreign body fragments: Secondary | ICD-10-CM | POA: Insufficient documentation

## 2012-02-24 DIAGNOSIS — E1165 Type 2 diabetes mellitus with hyperglycemia: Secondary | ICD-10-CM

## 2012-02-24 DIAGNOSIS — E1140 Type 2 diabetes mellitus with diabetic neuropathy, unspecified: Secondary | ICD-10-CM

## 2012-02-24 DIAGNOSIS — N289 Disorder of kidney and ureter, unspecified: Secondary | ICD-10-CM | POA: Insufficient documentation

## 2012-02-24 DIAGNOSIS — E11359 Type 2 diabetes mellitus with proliferative diabetic retinopathy without macular edema: Secondary | ICD-10-CM | POA: Insufficient documentation

## 2012-02-24 DIAGNOSIS — E785 Hyperlipidemia, unspecified: Secondary | ICD-10-CM

## 2012-02-24 DIAGNOSIS — I10 Essential (primary) hypertension: Secondary | ICD-10-CM | POA: Insufficient documentation

## 2012-02-24 DIAGNOSIS — T1590XA Foreign body on external eye, part unspecified, unspecified eye, initial encounter: Secondary | ICD-10-CM | POA: Insufficient documentation

## 2012-02-24 DIAGNOSIS — H2510 Age-related nuclear cataract, unspecified eye: Secondary | ICD-10-CM

## 2012-02-24 DIAGNOSIS — J4489 Other specified chronic obstructive pulmonary disease: Secondary | ICD-10-CM | POA: Insufficient documentation

## 2012-02-24 HISTORY — PX: PARS PLANA VITRECTOMY: SHX2166

## 2012-02-24 HISTORY — PX: SILICON OIL REMOVAL: SHX5305

## 2012-02-24 HISTORY — DX: Type 2 diabetes mellitus with diabetic neuropathy, unspecified: E11.40

## 2012-02-24 LAB — BASIC METABOLIC PANEL
Calcium: 9.3 mg/dL (ref 8.4–10.5)
Creatinine, Ser: 3.43 mg/dL — ABNORMAL HIGH (ref 0.50–1.10)
GFR calc non Af Amer: 14 mL/min — ABNORMAL LOW (ref >90)
Sodium: 138 mEq/L (ref 135–145)

## 2012-02-24 LAB — CBC
MCH: 34.9 pg — ABNORMAL HIGH (ref 26.0–34.0)
Platelets: 262 10*3/uL (ref 150–400)
RBC: 2.61 MIL/uL — ABNORMAL LOW (ref 3.87–5.11)
RDW: 12.9 % (ref 11.5–15.5)
WBC: 12.4 10*3/uL — ABNORMAL HIGH (ref 4.0–10.5)

## 2012-02-24 LAB — SURGICAL PCR SCREEN: MRSA, PCR: NEGATIVE

## 2012-02-24 SURGERY — PARS PLANA VITRECTOMY WITH 25 GAUGE
Anesthesia: General | Site: Eye | Laterality: Left | Wound class: Clean

## 2012-02-24 MED ORDER — SODIUM CHLORIDE 0.9 % IJ SOLN
INTRAMUSCULAR | Status: DC | PRN
  Administered 2012-02-24: 11:00:00

## 2012-02-24 MED ORDER — CEFAZOLIN SODIUM-DEXTROSE 2-3 GM-% IV SOLR: 2.0000 g | INTRAVENOUS | Status: DC

## 2012-02-24 MED ORDER — MEPERIDINE HCL 25 MG/ML IJ SOLN: 6.2500 mg | INTRAMUSCULAR | Status: DC | PRN

## 2012-02-24 MED ORDER — CYCLOPENTOLATE HCL 1 % OP SOLN: 1.0000 [drp] | OPHTHALMIC | Status: DC | PRN

## 2012-02-24 MED ORDER — PHENYLEPHRINE HCL 2.5 % OP SOLN: 1.0000 [drp] | OPHTHALMIC | Status: DC | PRN

## 2012-02-24 MED ORDER — ONDANSETRON HCL 4 MG/2ML IJ SOLN
4.0000 mg | Freq: Four times a day (QID) | INTRAMUSCULAR | Status: DC | PRN
  Administered 2012-02-24 – 2012-02-25 (×2): 4 mg via INTRAVENOUS
  Filled 2012-02-24 (×3): qty 2

## 2012-02-24 MED ORDER — HYDROCODONE-ACETAMINOPHEN 5-325 MG PO TABS: 1.0000 | ORAL_TABLET | ORAL | Status: DC | PRN

## 2012-02-24 MED ORDER — HYDROMORPHONE HCL PF 1 MG/ML IJ SOLN
INTRAMUSCULAR | Status: AC
  Filled 2012-02-24: qty 1

## 2012-02-24 MED ORDER — TROPICAMIDE 1 % OP SOLN
OPHTHALMIC | Status: AC
  Administered 2012-02-24: 1 [drp]
  Filled 2012-02-24: qty 3

## 2012-02-24 MED ORDER — BACITRACIN-POLYMYXIN B 500-10000 UNIT/GM OP OINT
TOPICAL_OINTMENT | OPHTHALMIC | Status: DC | PRN
  Administered 2012-02-24: 1

## 2012-02-24 MED ORDER — GLYCOPYRROLATE 0.2 MG/ML IJ SOLN
INTRAMUSCULAR | Status: DC | PRN
  Administered 2012-02-24: 0.4 mg via INTRAVENOUS

## 2012-02-24 MED ORDER — HEMOSTATIC AGENTS (NO CHARGE) OPTIME
TOPICAL | Status: DC | PRN
  Administered 2012-02-24: 1 via TOPICAL

## 2012-02-24 MED ORDER — MUPIROCIN 2 % EX OINT
TOPICAL_OINTMENT | Freq: Once | CUTANEOUS | Status: AC
  Administered 2012-02-24: 1 via NASAL
  Filled 2012-02-24: qty 22

## 2012-02-24 MED ORDER — LOSARTAN POTASSIUM 25 MG PO TABS
25.0000 mg | ORAL_TABLET | Freq: Every day | ORAL | Status: DC
  Filled 2012-02-24: qty 1

## 2012-02-24 MED ORDER — INSULIN DETEMIR 100 UNIT/ML ~~LOC~~ SOLN
30.0000 [IU] | Freq: Every day | SUBCUTANEOUS | Status: DC
  Filled 2012-02-24: qty 10

## 2012-02-24 MED ORDER — ACETAZOLAMIDE SODIUM 500 MG IJ SOLR
500.0000 mg | Freq: Once | INTRAMUSCULAR | Status: AC
  Administered 2012-02-25: 500 mg via INTRAVENOUS
  Filled 2012-02-24: qty 500

## 2012-02-24 MED ORDER — HYPROMELLOSE (GONIOSCOPIC) 2.5 % OP SOLN
OPHTHALMIC | Status: AC
  Filled 2012-02-24: qty 15

## 2012-02-24 MED ORDER — PROPOFOL 10 MG/ML IV BOLUS
INTRAVENOUS | Status: DC | PRN
  Administered 2012-02-24: 50 mg via INTRAVENOUS
  Administered 2012-02-24: 150 mg via INTRAVENOUS

## 2012-02-24 MED ORDER — BACITRACIN-POLYMYXIN B 500-10000 UNIT/GM OP OINT
1.0000 "application " | TOPICAL_OINTMENT | Freq: Four times a day (QID) | OPHTHALMIC | Status: DC
  Filled 2012-02-24: qty 3.5

## 2012-02-24 MED ORDER — PROVISC 10 MG/ML IO SOLN
INTRAOCULAR | Status: DC | PRN
  Administered 2012-02-24: .85 mL via INTRAOCULAR

## 2012-02-24 MED ORDER — BSS IO SOLN
INTRAOCULAR | Status: DC | PRN
  Administered 2012-02-24: 15 mL via INTRAOCULAR

## 2012-02-24 MED ORDER — FUROSEMIDE 40 MG PO TABS
40.0000 mg | ORAL_TABLET | Freq: Every day | ORAL | Status: DC
Start: 2012-02-25 — End: 2012-02-25
  Filled 2012-02-24: qty 1

## 2012-02-24 MED ORDER — CYCLOPENTOLATE HCL 1 % OP SOLN
1.0000 [drp] | OPHTHALMIC | Status: AC | PRN
  Administered 2012-02-24 (×3): 1 [drp] via OPHTHALMIC

## 2012-02-24 MED ORDER — GATIFLOXACIN 0.5 % OP SOLN
OPHTHALMIC | Status: AC
  Administered 2012-02-24: 1 [drp]
  Filled 2012-02-24: qty 2.5

## 2012-02-24 MED ORDER — GATIFLOXACIN 0.5 % OP SOLN
1.0000 [drp] | OPHTHALMIC | Status: AC | PRN
  Administered 2012-02-24 (×2): 1 [drp] via OPHTHALMIC

## 2012-02-24 MED ORDER — PHENYLEPHRINE HCL 2.5 % OP SOLN
1.0000 [drp] | OPHTHALMIC | Status: AC | PRN
  Administered 2012-02-24 (×2): 1 [drp] via OPHTHALMIC

## 2012-02-24 MED ORDER — BSS PLUS IO SOLN
INTRAOCULAR | Status: AC
  Filled 2012-02-24: qty 500

## 2012-02-24 MED ORDER — ATROPINE SULFATE 1 % OP SOLN
OPHTHALMIC | Status: DC | PRN
  Administered 2012-02-24: 1 [drp] via OPHTHALMIC

## 2012-02-24 MED ORDER — PHENYLEPHRINE HCL 2.5 % OP SOLN
OPHTHALMIC | Status: AC
  Administered 2012-02-24: 1 [drp]
  Filled 2012-02-24: qty 3

## 2012-02-24 MED ORDER — BUPIVACAINE HCL 0.75 % IJ SOLN
INTRAMUSCULAR | Status: AC
  Filled 2012-02-24: qty 10

## 2012-02-24 MED ORDER — PREDNISOLONE ACETATE 1 % OP SUSP
1.0000 [drp] | Freq: Four times a day (QID) | OPHTHALMIC | Status: DC
  Filled 2012-02-24: qty 5
  Filled 2012-02-24: qty 1

## 2012-02-24 MED ORDER — SODIUM CHLORIDE 0.45 % IV SOLN
INTRAVENOUS | Status: DC
  Administered 2012-02-24: 20 mL/h via INTRAVENOUS

## 2012-02-24 MED ORDER — GABAPENTIN 800 MG PO TABS
800.0000 mg | ORAL_TABLET | Freq: Two times a day (BID) | ORAL | Status: DC
  Filled 2012-02-24: qty 1

## 2012-02-24 MED ORDER — LIDOCAINE HCL 2 % IJ SOLN
INTRAMUSCULAR | Status: AC
  Filled 2012-02-24: qty 20

## 2012-02-24 MED ORDER — ONDANSETRON HCL 4 MG/2ML IJ SOLN
INTRAMUSCULAR | Status: DC | PRN
  Administered 2012-02-24: 4 mg via INTRAVENOUS

## 2012-02-24 MED ORDER — OXYCODONE HCL 5 MG/5ML PO SOLN: 5.0000 mg | Freq: Once | ORAL | Status: DC | PRN

## 2012-02-24 MED ORDER — ATROPINE SULFATE 1 % OP SOLN
OPHTHALMIC | Status: AC
  Filled 2012-02-24: qty 2

## 2012-02-24 MED ORDER — TROPICAMIDE 1 % OP SOLN
1.0000 [drp] | OPHTHALMIC | Status: AC | PRN
  Administered 2012-02-24 (×2): 1 [drp] via OPHTHALMIC

## 2012-02-24 MED ORDER — MAGNESIUM HYDROXIDE 400 MG/5ML PO SUSP: 15.0000 mL | Freq: Four times a day (QID) | ORAL | Status: DC | PRN

## 2012-02-24 MED ORDER — INSULIN GLARGINE 100 UNIT/ML ~~LOC~~ SOLN: 5.0000 [IU] | Freq: Every day | SUBCUTANEOUS | Status: DC

## 2012-02-24 MED ORDER — INSULIN ASPART 100 UNIT/ML ~~LOC~~ SOLN
0.0000 [IU] | SUBCUTANEOUS | Status: DC
  Administered 2012-02-24: 2 [IU] via SUBCUTANEOUS
  Administered 2012-02-24: 15 [IU] via SUBCUTANEOUS
  Administered 2012-02-25: 2 [IU] via SUBCUTANEOUS
  Administered 2012-02-25: 3 [IU] via SUBCUTANEOUS
  Administered 2012-02-25: 15 [IU] via SUBCUTANEOUS

## 2012-02-24 MED ORDER — BACITRACIN-POLYMYXIN B 500-10000 UNIT/GM OP OINT
TOPICAL_OINTMENT | OPHTHALMIC | Status: AC
  Filled 2012-02-24: qty 3.5

## 2012-02-24 MED ORDER — DEXAMETHASONE SODIUM PHOSPHATE 10 MG/ML IJ SOLN
INTRAMUSCULAR | Status: DC | PRN
  Administered 2012-02-24: 10 mg

## 2012-02-24 MED ORDER — HYDROCODONE-ACETAMINOPHEN 5-325 MG PO TABS
1.0000 | ORAL_TABLET | ORAL | Status: DC | PRN
  Administered 2012-02-24: 1 via ORAL
  Filled 2012-02-24 (×2): qty 1

## 2012-02-24 MED ORDER — DOCUSATE SODIUM 100 MG PO CAPS: 100.0000 mg | ORAL_CAPSULE | Freq: Two times a day (BID) | ORAL | Status: DC

## 2012-02-24 MED ORDER — TRIAMCINOLONE ACETONIDE 40 MG/ML IJ SUSP
INTRAMUSCULAR | Status: AC
  Filled 2012-02-24: qty 1

## 2012-02-24 MED ORDER — SODIUM CHLORIDE 0.9 % IV SOLN
INTRAVENOUS | Status: DC
  Administered 2012-02-24: 12:00:00 via INTRAVENOUS

## 2012-02-24 MED ORDER — DEXAMETHASONE SODIUM PHOSPHATE 10 MG/ML IJ SOLN
INTRAMUSCULAR | Status: AC
  Filled 2012-02-24: qty 1

## 2012-02-24 MED ORDER — MINERAL OIL LIGHT 100 % EX OIL
TOPICAL_OIL | CUTANEOUS | Status: AC
  Filled 2012-02-24: qty 25

## 2012-02-24 MED ORDER — LIDOCAINE HCL (CARDIAC) 20 MG/ML IV SOLN
INTRAVENOUS | Status: DC | PRN
  Administered 2012-02-24: 80 mg via INTRAVENOUS

## 2012-02-24 MED ORDER — TETRACAINE HCL 0.5 % OP SOLN
2.0000 [drp] | Freq: Once | OPHTHALMIC | Status: DC
  Filled 2012-02-24: qty 2

## 2012-02-24 MED ORDER — BRIMONIDINE TARTRATE 0.2 % OP SOLN
1.0000 [drp] | Freq: Two times a day (BID) | OPHTHALMIC | Status: DC
  Filled 2012-02-24: qty 5

## 2012-02-24 MED ORDER — TROPICAMIDE 1 % OP SOLN: 1.0000 [drp] | OPHTHALMIC | Status: DC | PRN

## 2012-02-24 MED ORDER — EPINEPHRINE HCL 1 MG/ML IJ SOLN
INTRAOCULAR | Status: DC | PRN
  Administered 2012-02-24: 11:00:00

## 2012-02-24 MED ORDER — GATIFLOXACIN 0.5 % OP SOLN: 1.0000 [drp] | OPHTHALMIC | Status: DC | PRN

## 2012-02-24 MED ORDER — OXYCODONE HCL 5 MG PO TABS: 5.0000 mg | ORAL_TABLET | Freq: Once | ORAL | Status: DC | PRN

## 2012-02-24 MED ORDER — EPINEPHRINE HCL 1 MG/ML IJ SOLN
INTRAMUSCULAR | Status: AC
  Filled 2012-02-24: qty 1

## 2012-02-24 MED ORDER — INFLUENZA VIRUS VACC SPLIT PF IM SUSP
0.5000 mL | INTRAMUSCULAR | Status: AC
  Administered 2012-02-25: 0.5 mL via INTRAMUSCULAR
  Filled 2012-02-24: qty 0.5

## 2012-02-24 MED ORDER — BSS IO SOLN
INTRAOCULAR | Status: AC
  Filled 2012-02-24: qty 15

## 2012-02-24 MED ORDER — TEMAZEPAM 15 MG PO CAPS: 15.0000 mg | ORAL_CAPSULE | Freq: Every evening | ORAL | Status: DC | PRN

## 2012-02-24 MED ORDER — SODIUM HYALURONATE 10 MG/ML IO SOLN
INTRAOCULAR | Status: AC
  Filled 2012-02-24: qty 0.85

## 2012-02-24 MED ORDER — CYCLOPENTOLATE HCL 1 % OP SOLN
OPHTHALMIC | Status: AC
  Administered 2012-02-24: 1 [drp] via OPHTHALMIC
  Filled 2012-02-24: qty 2

## 2012-02-24 MED ORDER — GATIFLOXACIN 0.5 % OP SOLN
1.0000 [drp] | Freq: Four times a day (QID) | OPHTHALMIC | Status: DC
  Filled 2012-02-24: qty 2.5

## 2012-02-24 MED ORDER — MIDAZOLAM HCL 5 MG/5ML IJ SOLN
INTRAMUSCULAR | Status: DC | PRN
  Administered 2012-02-24: 2 mg via INTRAVENOUS

## 2012-02-24 MED ORDER — SODIUM CHLORIDE 0.9 % IV SOLN
INTRAVENOUS | Status: DC | PRN
  Administered 2012-02-24 (×2): via INTRAVENOUS

## 2012-02-24 MED ORDER — HYALURONIDASE HUMAN 150 UNIT/ML IJ SOLN
INTRAMUSCULAR | Status: AC
  Filled 2012-02-24: qty 1

## 2012-02-24 MED ORDER — MORPHINE SULFATE 2 MG/ML IJ SOLN
1.0000 mg | INTRAMUSCULAR | Status: AC | PRN
  Administered 2012-02-24 – 2012-02-25 (×2): 2 mg via INTRAVENOUS
  Filled 2012-02-24 (×2): qty 1

## 2012-02-24 MED ORDER — PROMETHAZINE HCL 25 MG/ML IJ SOLN: 6.2500 mg | INTRAMUSCULAR | Status: DC | PRN

## 2012-02-24 MED ORDER — SIMVASTATIN 40 MG PO TABS
40.0000 mg | ORAL_TABLET | Freq: Every day | ORAL | Status: DC
Start: 2012-02-24 — End: 2012-02-25
  Administered 2012-02-24: 40 mg via ORAL
  Filled 2012-02-24 (×2): qty 1

## 2012-02-24 MED ORDER — HYDROMORPHONE HCL PF 1 MG/ML IJ SOLN
0.2500 mg | INTRAMUSCULAR | Status: DC | PRN
  Administered 2012-02-24 (×2): 0.5 mg via INTRAVENOUS

## 2012-02-24 MED ORDER — ACETAMINOPHEN 325 MG PO TABS: 325.0000 mg | ORAL_TABLET | ORAL | Status: DC | PRN

## 2012-02-24 MED ORDER — ACETAZOLAMIDE SODIUM 500 MG IJ SOLR
INTRAMUSCULAR | Status: AC
  Filled 2012-02-24: qty 500

## 2012-02-24 MED ORDER — LATANOPROST 0.005 % OP SOLN
1.0000 [drp] | Freq: Every day | OPHTHALMIC | Status: DC
  Filled 2012-02-24: qty 2.5

## 2012-02-24 MED ORDER — CARVEDILOL 6.25 MG PO TABS
6.2500 mg | ORAL_TABLET | Freq: Two times a day (BID) | ORAL | Status: DC
  Administered 2012-02-24 – 2012-02-25 (×2): 6.25 mg via ORAL
  Filled 2012-02-24 (×4): qty 1

## 2012-02-24 MED ORDER — POLYMYXIN B SULFATE 500000 UNITS IJ SOLR
INTRAMUSCULAR | Status: AC
  Filled 2012-02-24: qty 1

## 2012-02-24 MED ORDER — GENTAMICIN SULFATE 40 MG/ML IJ SOLN
INTRAMUSCULAR | Status: AC
  Filled 2012-02-24: qty 2

## 2012-02-24 MED ORDER — BUPIVACAINE HCL 0.75 % IJ SOLN
INTRAMUSCULAR | Status: DC | PRN
  Administered 2012-02-24: 10 mL

## 2012-02-24 MED ORDER — MUPIROCIN 2 % EX OINT
TOPICAL_OINTMENT | CUTANEOUS | Status: AC
  Filled 2012-02-24: qty 22

## 2012-02-24 MED ORDER — CEFAZOLIN SODIUM-DEXTROSE 2-3 GM-% IV SOLR
2.0000 g | INTRAVENOUS | Status: AC
  Administered 2012-02-24: 2 g via INTRAVENOUS

## 2012-02-24 MED ORDER — ROCURONIUM BROMIDE 100 MG/10ML IV SOLN
INTRAVENOUS | Status: DC | PRN
  Administered 2012-02-24: 40 mg via INTRAVENOUS

## 2012-02-24 MED ORDER — FENTANYL CITRATE 0.05 MG/ML IJ SOLN
INTRAMUSCULAR | Status: DC | PRN
  Administered 2012-02-24 (×2): 50 ug via INTRAVENOUS

## 2012-02-24 MED ORDER — NEOSTIGMINE METHYLSULFATE 1 MG/ML IJ SOLN
INTRAMUSCULAR | Status: DC | PRN
  Administered 2012-02-24: 3.5 mg via INTRAVENOUS

## 2012-02-24 MED ORDER — GABAPENTIN 400 MG PO CAPS
800.0000 mg | ORAL_CAPSULE | Freq: Two times a day (BID) | ORAL | Status: DC
  Administered 2012-02-24: 800 mg via ORAL
  Filled 2012-02-24 (×3): qty 2

## 2012-02-24 SURGICAL SUPPLY — 72 items
APPLICATOR DR MATTHEWS STRL (MISCELLANEOUS) IMPLANT
BLADE EYE CATARACT 19 1.4 BEAV (BLADE) IMPLANT
BLADE MVR KNIFE 19G (BLADE) ×2 IMPLANT
BLADE MVR KNIFE 20G (BLADE) ×2 IMPLANT
CANNULA DUAL BORE 23G (CANNULA) IMPLANT
CANNULA FLEX TIP 25G (CANNULA) IMPLANT
CLOTH BEACON ORANGE TIMEOUT ST (SAFETY) ×2 IMPLANT
CORDS BIPOLAR (ELECTRODE) ×2 IMPLANT
COTTONBALL LRG STERILE PKG (GAUZE/BANDAGES/DRESSINGS) ×6 IMPLANT
DRAPE INCISE 51X51 W/FILM STRL (DRAPES) ×2 IMPLANT
DRAPE OPHTHALMIC 77X100 STRL (CUSTOM PROCEDURE TRAY) ×2 IMPLANT
FILTER BLUE MILLIPORE (MISCELLANEOUS) IMPLANT
FILTER STRAW FLUID ASPIR (MISCELLANEOUS) IMPLANT
FORCEPS ECKARDT ILM 25G SERR (OPHTHALMIC RELATED) IMPLANT
GLOVE BIO SURGEON STRL SZ7.5 (GLOVE) ×2 IMPLANT
GLOVE BIOGEL PI IND STRL 6.5 (GLOVE) ×1 IMPLANT
GLOVE BIOGEL PI IND STRL 7.5 (GLOVE) ×2 IMPLANT
GLOVE BIOGEL PI INDICATOR 6.5 (GLOVE) ×1
GLOVE BIOGEL PI INDICATOR 7.5 (GLOVE) ×2
GLOVE ECLIPSE 6.5 STRL STRAW (GLOVE) ×4 IMPLANT
GLOVE SS BIOGEL STRL SZ 6.5 (GLOVE) ×1 IMPLANT
GLOVE SS BIOGEL STRL SZ 7 (GLOVE) ×1 IMPLANT
GLOVE SUPERSENSE BIOGEL SZ 6.5 (GLOVE) ×1
GLOVE SUPERSENSE BIOGEL SZ 7 (GLOVE) ×1
GLOVE SURG 8.5 LATEX PF (GLOVE) ×2 IMPLANT
GLOVE SURG SS PI 6.5 STRL IVOR (GLOVE) ×2 IMPLANT
GOWN STRL NON-REIN LRG LVL3 (GOWN DISPOSABLE) ×12 IMPLANT
ILLUMINATOR CHOW PICK 25GA (MISCELLANEOUS) ×2 IMPLANT
KIT BASIN OR (CUSTOM PROCEDURE TRAY) ×2 IMPLANT
KIT ROOM TURNOVER OR (KITS) ×2 IMPLANT
KNIFE CRESCENT 2.5 55 ANG (BLADE) IMPLANT
LENS BIOM SUPER VIEW SET DISP (OPHTHALMIC RELATED) IMPLANT
MARKER SKIN DUAL TIP RULER LAB (MISCELLANEOUS) IMPLANT
MASK EYE SHIELD (GAUZE/BANDAGES/DRESSINGS) ×2 IMPLANT
MICROPICK 25G (MISCELLANEOUS)
NEEDLE 18GX1X1/2 (RX/OR ONLY) (NEEDLE) ×2 IMPLANT
NEEDLE 25GX 5/8IN NON SAFETY (NEEDLE) ×2 IMPLANT
NEEDLE 27GAX1X1/2 (NEEDLE) IMPLANT
NEEDLE FILTER BLUNT 18X 1/2SAF (NEEDLE) ×1
NEEDLE FILTER BLUNT 18X1 1/2 (NEEDLE) ×1 IMPLANT
NEEDLE HYPO 30X.5 LL (NEEDLE) ×2 IMPLANT
NS IRRIG 1000ML POUR BTL (IV SOLUTION) ×2 IMPLANT
PACK VITRECTOMY CUSTOM (CUSTOM PROCEDURE TRAY) ×2 IMPLANT
PAD ARMBOARD 7.5X6 YLW CONV (MISCELLANEOUS) ×4 IMPLANT
PAD EYE OVAL STERILE LF (GAUZE/BANDAGES/DRESSINGS) ×2 IMPLANT
PAK VITRECTOMY PIK 25 GA (OPHTHALMIC RELATED) ×2 IMPLANT
PENCIL BIPOLAR 25GA STR DISP (OPHTHALMIC RELATED) IMPLANT
PICK MICROPICK 25G (MISCELLANEOUS) IMPLANT
PROBE DIRECTIONAL LASER (MISCELLANEOUS) ×2 IMPLANT
REPL STRA BRUSH NEEDLE (NEEDLE) IMPLANT
RESERVOIR BACK FLUSH (MISCELLANEOUS) IMPLANT
ROLLS DENTAL (MISCELLANEOUS) ×4 IMPLANT
SCRAPER DIAMOND DUST MEMBRANE (MISCELLANEOUS) IMPLANT
SET FLUID INJECTOR (SET/KITS/TRAYS/PACK) ×4 IMPLANT
SPONGE SURGIFOAM ABS GEL 12-7 (HEMOSTASIS) ×2 IMPLANT
STOPCOCK 4 WAY LG BORE MALE ST (IV SETS) IMPLANT
SUT CHROMIC 7 0 TG140 8 (SUTURE) IMPLANT
SUT ETHILON 10 0 CS140 6 (SUTURE) IMPLANT
SUT ETHILON 9 0 TG140 8 (SUTURE) IMPLANT
SUT POLY NON ABSORB 10-0 8 STR (SUTURE) IMPLANT
SUT SILK 4 0 RB 1 (SUTURE) ×2 IMPLANT
SYR 20CC LL (SYRINGE) ×2 IMPLANT
SYR 5ML LL (SYRINGE) IMPLANT
SYR BULB 3OZ (MISCELLANEOUS) ×2 IMPLANT
SYR TB 1ML LUER SLIP (SYRINGE) ×2 IMPLANT
SYRINGE 10CC LL (SYRINGE) IMPLANT
TAPE SURG TRANSPORE 1 IN (GAUZE/BANDAGES/DRESSINGS) ×1 IMPLANT
TAPE SURGICAL TRANSPORE 1 IN (GAUZE/BANDAGES/DRESSINGS) ×1
TOWEL OR 17X24 6PK STRL BLUE (TOWEL DISPOSABLE) ×4 IMPLANT
TROCAR CANNULA 25GA (CANNULA) IMPLANT
WATER STERILE IRR 1000ML POUR (IV SOLUTION) ×2 IMPLANT
WIPE INSTRUMENT VISIWIPE 73X73 (MISCELLANEOUS) ×2 IMPLANT

## 2012-02-24 NOTE — [place] Notes (Signed)
NOTIFIED DR MASSAGEE OF PATIENT EATING HALF PIECE HAM  FROM HAM BISCUIT AT 0630 TODAY, HE STATED IT WAS OKAY.

## 2012-02-24 NOTE — Preoperative (Signed)
Beta Blockers   Reason not to administer Beta Blockers:Not Applicable 

## 2012-02-24 NOTE — Transfer of Care (Signed)
Immediate Anesthesia Transfer of Care Note  Patient: Theresa [name]  Procedure(s) Performed: Procedure(s) (LRB) with comments: PARS PLANA VITRECTOMY WITH 25 GAUGE (Left) SILICON OIL REMOVAL (Left) PHOTOCOAGULATION WITH LASER (Left) - Endolaser  Patient Location: PACU  Anesthesia Type: General  Level of Consciousness: awake, alert , oriented and sedated  Airway & Oxygen Therapy: Patient Spontanous Breathing and Patient connected to nasal cannula oxygen  Post-op Assessment: Report given to PACU RN, Post -op Vital signs reviewed and stable and Patient moving all extremities  Post vital signs: Reviewed and stable  Complications: No apparent anesthesia complications

## 2012-02-24 NOTE — Anesthesia Postprocedure Evaluation (Signed)
  Anesthesia Post-op Note  Patient: Theresa [name]  Procedure(s) Performed: Procedure(s) (LRB) with comments: PARS PLANA VITRECTOMY WITH 25 GAUGE (Left) SILICON OIL REMOVAL (Left) PHOTOCOAGULATION WITH LASER (Left) - Endolaser  Patient Location: PACU  Anesthesia Type: General  Level of Consciousness: awake  Airway and Oxygen Therapy: Patient Spontanous Breathing  Post-op Pain: mild  Post-op Assessment: Post-op Vital signs reviewed  Post-op Vital Signs: stable  Complications: No apparent anesthesia complications

## 2012-02-24 NOTE — Brief Op Note (Signed)
02/24/2012  1:21 PM  PATIENT:  Theresa [name]  56 y.o. female  PRE-OPERATIVE DIAGNOSIS:  Removal Silicone Oil Left Eye; Proliferative Diabetic Retinopathy Left Eye  POST-OPERATIVE DIAGNOSIS:  Removal Silicone Oil Left Eye; Proliferative Diabetic Retinopathy Left Eye  PROCEDURE:  Procedure(s) (LRB) with comments: PARS PLANA VITRECTOMY WITH 25 GAUGE (Left) INSERTION OF GAS (Left) SILICON OIL REMOVAL (Left) PHOTOCOAGULATION WITH LASER (Left) - Endolaser  SURGEON:  Surgeon(s) and Role:    * [name], MD - Primary  Brief Operative note   Preoperative diagnosis:  Pre-Op Diagnosis Codes:    * Proliferative diabetic retinopathy [250.50, 362.02] Postoperative diagnosis  Post-Op Diagnosis Codes:    * Proliferative diabetic retinopathy [250.50, 362.02]  Procedures: @ORPROCAL @  Surgeon:  [name], MD...  Assistant:  Deatra Ina SA   Anesthesia: General  Specimen: none  Estimated blood loss:  1cc  Complications: none  Patient sent to PACU in good condition  Composed by [name] MD  Dictation number: [id number]

## 2012-02-24 NOTE — Anesthesia Preprocedure Evaluation (Addendum)
Anesthesia Evaluation  Patient identified by MRN, date of birth, ID band Patient awake    Reviewed: Allergy & Precautions, H&P , NPO status , Patient's Chart, lab work & pertinent test results  History of Anesthesia Complications Negative for: history of anesthetic complications  Airway Mallampati: II  Neck ROM: Full    Dental  (+) Teeth Intact   Pulmonary shortness of breath, COPD breath sounds clear to auscultation        Cardiovascular hypertension, + Valvular Problems/Murmurs MR Rhythm:Regular Rate:Normal  Mod to severe MR   Neuro/Psych    GI/Hepatic   Endo/Other  diabetes  Renal/GU Renal InsufficiencyRenal diseaseCr 3.3     Musculoskeletal negative musculoskeletal ROS (+)   Abdominal   Peds  Hematology negative hematology ROS (+)   Anesthesia Other Findings   Reproductive/Obstetrics                          Anesthesia Physical Anesthesia Plan  ASA: III  Anesthesia Plan: General   Post-op Pain Management:    Induction: Intravenous  Airway Management Planned: Oral ETT  Additional Equipment:   Intra-op Plan:   Post-operative Plan: Extubation in OR  Informed Consent: I have reviewed the patients History and Physical, chart, labs and discussed the procedure including the risks, benefits and alternatives for the proposed anesthesia with the patient or authorized representative who has indicated his/her understanding and acceptance.   Dental advisory given  Plan Discussed with:   Anesthesia Plan Comments:         Anesthesia Quick Evaluation

## 2012-02-24 NOTE — H&P (Signed)
Theresa [name] is an 56 y.o. female.   Chief Complaint: Longstanding diabetic retinopathy left eye HPI: Needs silicone oil removal from left eye  Past Medical History  Diagnosis Date  . Hypertension   . Hyperlipidemia   . COPD (chronic obstructive pulmonary disease)   . Emphysema   . Shortness of breath     more with exertion  . Pneumonia     June 2012  . Bronchitis     Mar 2012  . Peripheral vascular disease   . Varicose vein   . Edema extremities     legs occasionally  . Chronic constipation   . Colon polyps   . Sickle cell trait   . Anemia   . Diabetes mellitus   . Chronic kidney disease   . Heart murmur     Dr. Cornelius Moras  . Urinary tract infection     hx of  . Diabetic neuropathy     Past Surgical History  Procedure Date  . Abdominal hysterectomy     2000  . Dilation and curettage of uterus     several in the early 80's  . Eye surgery     bilateral laser 2012  . Esophagogastroduodenoscopy     2012  . Colonscopy   . Tubal ligation     1979  . Pars plana vitrectomy 04/22/2011    Procedure: PARS PLANA VITRECTOMY WITH 25 GAUGE;  Surgeon: [name], MD;  Location: [facility];  Service: Ophthalmology;  Laterality: Left;  membrane peel, endolaser, gas fluid exchange, silicone oil, repair of complex traction retinal detachment  . Pars plana vitrectomy 09/30/2011    Procedure: PARS PLANA VITRECTOMY WITH 25 GAUGE;  Surgeon: [name], MD;  Location: [facility];  Service: Ophthalmology;  Laterality: Right;  Endolaser; Repair of Complex Traction Retinal Detachment  . Gas/fluid exchange 09/30/2011    Procedure: GAS/FLUID EXCHANGE;  Surgeon: [name], MD;  Location: [facility];  Service: Ophthalmology;  Laterality: Right;  . Gas insertion 09/30/2011    Procedure: INSERTION OF GAS;  Surgeon: [name], MD;  Location: [facility];  Service: Ophthalmology;  Laterality: Right;  [id number]  . Eye surgery     right    Family History  Problem Relation Age of Onset  . Anesthesia  problems Neg Hx   . Hypotension Neg Hx   . Malignant hyperthermia Neg Hx   . Pseudochol deficiency Neg Hx   . Stroke Mother   . Heart attack Mother   . Heart disease Mother   . Colon cancer Father   . Colon cancer Sister   . Heart attack Brother   . Stroke Brother   . Diabetes Brother   . Breast cancer Sister   . Diabetes Brother   . Diabetes Daughter    Social History:  reports that she quit smoking about 20 months ago. She does not have any smokeless tobacco history on file. She reports that she does not drink alcohol or use illicit drugs.  Allergies: No Known Allergies  No prescriptions prior to admission    Review of systems otherwise negative  Height 5' 7.5" (1.715 m), weight 179 lb (81.194 kg).  Physical exam: Mental status: oriented x3. Eyes: See eye exam associated with this date of surgery in media tab.  Scanned in by scanning center Ears, Nose, Throat: within normal limits Neck: Within Normal limits General: within normal limits Chest: Within normal limits Breast: deferred Heart: Within normal limits Abdomen: Within normal limits GU:  deferred Extremities: within normal limits Skin: within normal limits  Assessment/Plan Proliferative diabetic retinopathy with silicone oil in the vitreous cavity left eye Plan: To [facility] for Pars plana vitrectomy with removal of silicone oil left eye  [name] 02/24/2012, 8:41 AM

## 2012-02-25 ENCOUNTER — Encounter (HOSPITAL_COMMUNITY): Payer: Self-pay

## 2012-02-25 MED ORDER — HYDROCODONE-ACETAMINOPHEN 5-500 MG PO TABS: 1.0000 | ORAL_TABLET | Freq: Four times a day (QID) | ORAL | Status: DC | PRN

## 2012-02-25 MED ORDER — GATIFLOXACIN 0.5 % OP SOLN: 1.0000 [drp] | Freq: Four times a day (QID) | OPHTHALMIC | Status: DC

## 2012-02-25 MED ORDER — BACITRACIN-POLYMYXIN B 500-10000 UNIT/GM OP OINT: 1.0000 "application " | TOPICAL_OINTMENT | Freq: Four times a day (QID) | OPHTHALMIC | Status: DC

## 2012-02-25 MED ORDER — PREDNISOLONE ACETATE 1 % OP SUSP: 1.0000 [drp] | Freq: Four times a day (QID) | OPHTHALMIC | Status: DC

## 2012-02-25 NOTE — [place] Notes (Signed)
02/25/2012, 6:42 AM  Mental Status:  Awake, Alert, Oriented  Anterior segment: Cornea  Clear    Anterior Chamber Clear    Lens:   Cataract  Intra Ocular Pressure 16 mmHg with Tonopen  Vitreous: Clear   Retina:  Attached Good laser reaction   Impression: Excellent result Retina attached   Final Diagnosis: Proliferative diabetic retinopathy with silicone oil in vitreous.  Plan: start post operative eye drops.  Discharge to home.  Give post operative instructions  Theresa [name] 02/25/2012, 6:42 AM

## 2012-02-25 NOTE — Op Note (Signed)
NAMEMarland Kitchen  Theresa [name], Theresa NO.:  [id number]  MEDICAL RECORD NO.:  [id number]  LOCATION:  [id number]                        FACILITY:  [facility]  PHYSICIAN:  [name]. Zigmund Daniel, M.D. DATE OF BIRTH:  [birth year]  DATE OF PROCEDURE:  02/24/2012 DATE OF DISCHARGE:                              OPERATIVE REPORT   ADMISSION DIAGNOSIS:  Proliferative diabetic retinopathy, silicone oil in the vitreous in the left eye.  PROCEDURES:  Pars plana vitrectomy with removal of silicone oil from vitreous, retinal photocoagulation, infusion of BSS all in the left eye.  SURGEON:  [name]. Zigmund Daniel, MD  ASSISTANT:  Deatra Ina, SA.  ANESTHESIA:  General.  DETAILS:  Usual prep and drape, 25-gauge trocars placed at 8 and 4 o'clock, 18-gauge incision.  Sclerotomy made at 2 o'clock.  Provisc placed on the corneal surface and the biome viewing system was moved into place.  The infusion was placed at 4 o'clock with a pressure of 30. The pars plana vitrectomy was begun just behind the pseudophakos clearing the path for viscous injection extractor.  The viscous injection extractor was then inserted through the 2 o'clock sclerotomy into the into the vitreous cavity where silicone oil was present. Silicone oil was carefully extracted in a slow manner.  Bubbles and bits of tissue were also allowed to come into the extractor device.  Once all of the oil was removed, the eye was tipped so that additional fluid will collect at the extractor point.  An additional extraction was carried out in this region.  The wide field viewing was obtained with the biome viewing system down into the vitreous cavity and a view was obtained of the retinal surface.  The endolaser was positioned in the eye, 537 burns were placed around the retinal periphery with power of 1000 mW, 1000 microns each and 0.1 seconds each.  The vitreous cutter was used to remove some small oil droplets.  Once all the oil was removed,  the instruments were removed from the eye.  Additional rinsing was allowed to occur until all bits of oil which came from the sclerotomy points had stopped.  A 9-0 nylon was used to close the sclerotomy site.  The 25- gauge trocars were removed.  The wounds were tested and found to be secure.  The conjunctivae were closed with wet-field cautery.  Polymyxin and gentamicin were irrigated into tenon space.  Atropine solution was applied.  Decadron 10 mg was injected into the lower subconjunctival space.  Marcaine was injected around the globe for postop pain.  Closing pressure was 10 with a Baer care tonometer.  COMPLICATIONS:  None.  Duration of 1 hour.  The patient was awakened and taken to recovery in satisfactory condition.     [name]. Zigmund Daniel, M.D.     JDM/MEDQ  D:  02/24/2012  T:  02/25/2012  Job:  [id number]

## 2012-02-25 NOTE — Discharge Summary (Signed)
Discharge summary not needed on [facility] patients per medical records. 

## 2012-02-25 NOTE — [place] Notes (Signed)
Discharge home.

## 2012-03-03 ENCOUNTER — Ambulatory Visit (INDEPENDENT_AMBULATORY_CARE_PROVIDER_SITE_OTHER): Payer: Self-pay | Admitting: Ophthalmology

## 2012-03-03 DIAGNOSIS — E1139 Type 2 diabetes mellitus with other diabetic ophthalmic complication: Secondary | ICD-10-CM

## 2012-03-03 DIAGNOSIS — E11359 Type 2 diabetes mellitus with proliferative diabetic retinopathy without macular edema: Secondary | ICD-10-CM

## 2012-03-19 ENCOUNTER — Ambulatory Visit (INDEPENDENT_AMBULATORY_CARE_PROVIDER_SITE_OTHER): Payer: Self-pay | Admitting: Internal Medicine

## 2012-03-19 ENCOUNTER — Encounter: Payer: Self-pay | Admitting: Internal Medicine

## 2012-03-19 VITALS — BP 140/80 | HR 78 | Temp 98.7°F | Ht 67.5 in | Wt 184.2 lb

## 2012-03-19 DIAGNOSIS — I10 Essential (primary) hypertension: Secondary | ICD-10-CM

## 2012-03-19 DIAGNOSIS — M25511 Pain in right shoulder: Secondary | ICD-10-CM

## 2012-03-19 DIAGNOSIS — N184 Chronic kidney disease, stage 4 (severe): Secondary | ICD-10-CM

## 2012-03-19 DIAGNOSIS — E1121 Type 2 diabetes mellitus with diabetic nephropathy: Secondary | ICD-10-CM

## 2012-03-19 DIAGNOSIS — E1129 Type 2 diabetes mellitus with other diabetic kidney complication: Secondary | ICD-10-CM

## 2012-03-19 DIAGNOSIS — E1169 Type 2 diabetes mellitus with other specified complication: Secondary | ICD-10-CM

## 2012-03-19 DIAGNOSIS — N058 Unspecified nephritic syndrome with other morphologic changes: Secondary | ICD-10-CM

## 2012-03-19 DIAGNOSIS — I152 Hypertension secondary to endocrine disorders: Secondary | ICD-10-CM

## 2012-03-19 DIAGNOSIS — M25519 Pain in unspecified shoulder: Secondary | ICD-10-CM

## 2012-03-19 MED ORDER — FUROSEMIDE 40 MG PO TABS: 40.0000 mg | ORAL_TABLET | Freq: Every day | ORAL | Status: DC

## 2012-03-19 NOTE — Assessment & Plan Note (Signed)
Patient reports blood sugars have been improved on current dose of Levemir. Will continue. We'll plan to recheck hemoglobin A1c in December 2013.

## 2012-03-19 NOTE — Assessment & Plan Note (Signed)
Recent GFR 15. Patient has followup with vascular surgery to discuss dialysis access and with her nephrologist next month. We'll plan to repeat renal function here in December 2013.

## 2012-03-19 NOTE — Assessment & Plan Note (Signed)
Blood pressure fairly well-controlled today. Will continue current medications. Plan to recheck renal function in December 2013.

## 2012-03-19 NOTE — Assessment & Plan Note (Signed)
Right shoulder pain and weakness with abduction are most consistent with rotator cuff tear. Will set up evaluation with orthopedics. Patient will use Vicodin as needed for severe pain in the interim.

## 2012-03-19 NOTE — [place] Notes (Signed)
Subjective:    Patient ID: Theresa [name], female    DOB: 1955-07-24, 56 y.o.   MRN: [id number]  HPI 56 year old female with chronic kidney disease stage IV, hypertension, diabetes, hyperlipidemia presents for followup. She recently underwent surgery for retinal detachment. She reports she has been doing well. At her previous visits, she had been struggling with both persistent nausea and cough. Both of these seem to have resolved. In regards to chronic kidney disease, she reports she has followup with vascular surgery next week to discuss access for dialysis and to her nephrologist next month. She reports full compliance with her medications. In regards to diabetes, she reports immediately after her eye surgery she had a couple of elevated blood sugars greater than 200 but since that time blood sugars have been well-controlled with use of Levemir 30 units daily. She denies any low blood sugars.  Her primary concern today is persistent right shoulder pain. This is been ongoing for several months. She denies any known trauma to her shoulder. She has weakness in her right arm and difficulty with abduction of her shoulder. She has difficulty lifting her granddaughter. She also occasionally gets shaking in her right arm when trying to lift objects. She reports pain in her right shoulder is described as aching and severe. It is improved but not completely relieved with the use of Vicodin.  Outpatient Encounter Prescriptions as of 03/19/2012  Medication Sig Dispense Refill  . bacitracin-polymyxin b (POLYSPORIN) ophthalmic ointment Place 1 application into the left eye 4 (four) times daily. apply to eye every 12 hours while awake  3.5 g    . carvedilol (COREG) 6.25 MG tablet Take 6.25 mg by mouth 2 (two) times daily.      . furosemide (LASIX) 40 MG tablet Take 1 tablet (40 mg total) by mouth daily.  30 tablet  6  . gabapentin (NEURONTIN) 800 MG tablet Take 1 tablet (800 mg total) by mouth 2 (two) times daily.   60 tablet  6  . gatifloxacin (ZYMAXID) 0.5 % SOLN Place 1 drop into the left eye 4 (four) times daily.      Marland Kitchen HYDROcodone-acetaminophen (VICODIN) 5-500 MG per tablet Take 1 tablet by mouth every 6 (six) hours as needed. For pain  30 tablet  0  . insulin detemir (LEVEMIR FLEXPEN) 100 UNIT/ML injection Inject 30 Units into the skin daily.       Marland Kitchen losartan (COZAAR) 25 MG tablet Take 25 mg by mouth daily.      . prednisoLONE acetate (PRED FORTE) 1 % ophthalmic suspension Place 1 drop into the left eye 4 (four) times daily.  5 mL    . simvastatin (ZOCOR) 40 MG tablet Take 1 tablet (40 mg total) by mouth at bedtime.  30 tablet  6  . DISCONTD: furosemide (LASIX) 40 MG tablet Take 40 mg by mouth daily.       BP 140/80  Pulse 78  Temp 98.7 F (37.1 C) (Oral)  Ht 5' 7.5" (1.715 m)  Wt 184 lb 4 oz (83.575 kg)  BMI 28.43 kg/m2  SpO2 97%  Review of Systems  Constitutional: Negative for fever, chills, appetite change, fatigue and unexpected weight change.  HENT: Negative for ear pain, congestion, sore throat, trouble swallowing, neck pain, voice change and sinus pressure.   Eyes: Negative for visual disturbance.  Respiratory: Negative for cough, shortness of breath, wheezing and stridor.   Cardiovascular: Negative for chest pain, palpitations and leg swelling.  Gastrointestinal: Negative for nausea, vomiting,  abdominal pain, diarrhea, constipation, blood in stool, abdominal distention and anal bleeding.  Genitourinary: Negative for dysuria and flank pain.  Musculoskeletal: Positive for myalgias and arthralgias. Negative for gait problem.  Skin: Negative for color change and rash.  Neurological: Positive for weakness. Negative for dizziness and headaches.  Hematological: Negative for adenopathy. Does not bruise/bleed easily.  Psychiatric/Behavioral: Negative for suicidal ideas, disturbed wake/sleep cycle and dysphoric mood. The patient is not nervous/anxious.        Objective:   Physical Exam    Constitutional: She is oriented to person, place, and time. She appears well-developed and well-nourished. No distress.  HENT:  Head: Normocephalic and atraumatic.  Right Ear: External ear normal.  Left Ear: External ear normal.  Nose: Nose normal.  Mouth/Throat: Oropharynx is clear and moist. No oropharyngeal exudate.  Eyes: Conjunctivae normal are normal. Pupils are equal, round, and reactive to light. Right eye exhibits no discharge. Left eye exhibits no discharge. No scleral icterus.  Neck: Normal range of motion. Neck supple. No tracheal deviation present. No thyromegaly present.  Cardiovascular: Normal rate, regular rhythm, normal heart sounds and intact distal pulses.  Exam reveals no gallop and no friction rub.   No murmur heard. Pulmonary/Chest: Effort normal and breath sounds normal. No respiratory distress. She has no wheezes. She has no rales. She exhibits no tenderness.  Musculoskeletal: She exhibits no edema and no tenderness.       Right shoulder: She exhibits decreased range of motion, tenderness, pain and decreased strength (decreased abduction). She exhibits no swelling, no effusion and no crepitus.  Lymphadenopathy:    She has no cervical adenopathy.  Neurological: She is alert and oriented to person, place, and time. No cranial nerve deficit. She exhibits normal muscle tone. Coordination normal.  Skin: Skin is warm and dry. No rash noted. She is not diaphoretic. No erythema. No pallor.  Psychiatric: She has a normal mood and affect. Her behavior is normal. Judgment and thought content normal.          Assessment & Plan:

## 2012-03-24 ENCOUNTER — Encounter (INDEPENDENT_AMBULATORY_CARE_PROVIDER_SITE_OTHER): Payer: Self-pay | Admitting: Ophthalmology

## 2012-03-24 DIAGNOSIS — E11359 Type 2 diabetes mellitus with proliferative diabetic retinopathy without macular edema: Secondary | ICD-10-CM

## 2012-03-24 DIAGNOSIS — E1139 Type 2 diabetes mellitus with other diabetic ophthalmic complication: Secondary | ICD-10-CM

## 2012-03-24 DIAGNOSIS — E1165 Type 2 diabetes mellitus with hyperglycemia: Secondary | ICD-10-CM

## 2012-04-20 ENCOUNTER — Other Ambulatory Visit: Payer: Self-pay | Admitting: Internal Medicine

## 2012-04-20 MED ORDER — HYDROCODONE-ACETAMINOPHEN 5-500 MG PO TABS: 1.0000 | ORAL_TABLET | Freq: Four times a day (QID) | ORAL | Status: DC | PRN

## 2012-04-20 NOTE — Telephone Encounter (Signed)
Pt called to see if dr walker will call in her pain meds    hydrocodine [facility]

## 2012-04-21 NOTE — Telephone Encounter (Signed)
Rx called to [facility]

## 2012-04-21 NOTE — Telephone Encounter (Signed)
Patient advised via telephone, she stated that she has nasal congestion.  No cough, no ear pain, no sore throat, no drainage just congestion.  She wants to know what she can take OTC?  Please advise.

## 2012-05-19 ENCOUNTER — Encounter: Payer: Self-pay | Admitting: Internal Medicine

## 2012-05-19 ENCOUNTER — Ambulatory Visit (INDEPENDENT_AMBULATORY_CARE_PROVIDER_SITE_OTHER): Payer: Self-pay | Admitting: Internal Medicine

## 2012-05-19 VITALS — BP 150/82 | HR 84 | Temp 98.6°F | Resp 16 | Wt 188.2 lb

## 2012-05-19 DIAGNOSIS — N058 Unspecified nephritic syndrome with other morphologic changes: Secondary | ICD-10-CM

## 2012-05-19 DIAGNOSIS — E1129 Type 2 diabetes mellitus with other diabetic kidney complication: Secondary | ICD-10-CM

## 2012-05-19 DIAGNOSIS — N184 Chronic kidney disease, stage 4 (severe): Secondary | ICD-10-CM

## 2012-05-19 DIAGNOSIS — E1121 Type 2 diabetes mellitus with diabetic nephropathy: Secondary | ICD-10-CM

## 2012-05-19 DIAGNOSIS — Z1331 Encounter for screening for depression: Secondary | ICD-10-CM

## 2012-05-19 DIAGNOSIS — M25519 Pain in unspecified shoulder: Secondary | ICD-10-CM

## 2012-05-19 DIAGNOSIS — R110 Nausea: Secondary | ICD-10-CM

## 2012-05-19 DIAGNOSIS — M25511 Pain in right shoulder: Secondary | ICD-10-CM

## 2012-05-19 MED ORDER — INSULIN DETEMIR 100 UNIT/ML ~~LOC~~ SOLN: 30.0000 [IU] | Freq: Every day | SUBCUTANEOUS | Status: DC

## 2012-05-19 NOTE — Assessment & Plan Note (Signed)
Recent decline in GFR to 14.  Pt is scheduled for vascular access next month, and plan to start HD in 3 -4 months. She is also looking into option of transplant. Offered support today. She appears to be handling things well with good support network from her family. Will continue to follow.

## 2012-05-19 NOTE — Assessment & Plan Note (Signed)
Right shoulder pain and weakness with abduction are most consistent with rotator cuff tear. We have tried to set up evaluation with orthopedics, but having difficulty finding a provider who will take pt without insurance.  If unable to set up in [facility] system, will try [facility].  Minimal improvement in symptoms with Vicodin, but will continue prn for now.

## 2012-05-19 NOTE — Assessment & Plan Note (Signed)
Some persistent symptoms of nausea, likely related to CKD4. Will continue to use Ondansetron and Phenergan.

## 2012-05-19 NOTE — Progress Notes (Signed)
Subjective:    Patient ID: Theresa [name], female    DOB: [birth year], 56 y.o.   MRN: [id number]  HPI 56YO female with h/o CKD 4, diabetes mellitus, and chronic right shoulder pain presents for follow up. Recenlty found to have decline in kidney function , with GFR 14. Vascular evaluation for dialysis access was scheduled for next month with plans for HD in 3-4 months. Pt reports feeling disappointed in this turn of events, but has accepted it. Hopeful, she will get kidney transplant at some point.  Still having some nausea, associated with CKD, typically prominent in the morning. Alleviated with Ondansetron or Phenergan. No vomiting.  In regards to DM, reports compliance with Levemir. BG well controlled per her report. Last A1c 7.8%.   In regards to chronic right shoulder pain, reports persistent aching pain, not alleviated with vicodin. Notes some weakness with abduction of her right arm. To date, have not been able to find orthopedic surgeon locally to see her, as she has had no insurance.  Outpatient Encounter Prescriptions as of 05/19/2012  Medication Sig Dispense Refill  . carvedilol (COREG) 6.25 MG tablet Take 6.25 mg by mouth 2 (two) times daily.      . furosemide (LASIX) 40 MG tablet Take 1 tablet (40 mg total) by mouth daily.  30 tablet  6  . gabapentin (NEURONTIN) 800 MG tablet Take 1 tablet (800 mg total) by mouth 2 (two) times daily.  60 tablet  6  . gatifloxacin (ZYMAXID) 0.5 % SOLN Place 1 drop into the left eye 4 (four) times daily.      Marland Kitchen HYDROcodone-acetaminophen (VICODIN) 5-500 MG per tablet Take 1 tablet by mouth every 6 (six) hours as needed. For pain  30 tablet  0  . insulin detemir (LEVEMIR FLEXPEN) 100 UNIT/ML injection Inject 30 Units into the skin daily.  10 mL  3  . losartan (COZAAR) 25 MG tablet Take 25 mg by mouth daily.      . prednisoLONE acetate (PRED FORTE) 1 % ophthalmic suspension Place 1 drop into the left eye 4 (four) times daily.  5 mL    . simvastatin (ZOCOR)  40 MG tablet Take 1 tablet (40 mg total) by mouth at bedtime.  30 tablet  6  . [DISCONTINUED] insulin detemir (LEVEMIR FLEXPEN) 100 UNIT/ML injection Inject 30 Units into the skin daily.       . [DISCONTINUED] bacitracin-polymyxin b (POLYSPORIN) ophthalmic ointment Place 1 application into the left eye 4 (four) times daily. apply to eye every 12 hours while awake  3.5 g     BP 150/82  Pulse 84  Temp 98.6 F (37 C) (Oral)  Resp 16  Wt 188 lb 4 oz (85.39 kg)  SpO2 99%  Review of Systems  Constitutional: Negative for fever, chills, appetite change, fatigue and unexpected weight change.  HENT: Negative for ear pain, congestion, sore throat, trouble swallowing, neck pain, voice change and sinus pressure.   Eyes: Negative for visual disturbance.  Respiratory: Negative for cough, shortness of breath, wheezing and stridor.   Cardiovascular: Negative for chest pain, palpitations and leg swelling.  Gastrointestinal: Positive for nausea. Negative for vomiting, abdominal pain, diarrhea, constipation, blood in stool, abdominal distention and anal bleeding.  Genitourinary: Negative for dysuria and flank pain.  Musculoskeletal: Positive for arthralgias. Negative for myalgias and gait problem.  Skin: Negative for color change and rash.  Neurological: Positive for weakness. Negative for dizziness and headaches.  Hematological: Negative for adenopathy. Does not bruise/bleed easily.  Psychiatric/Behavioral: Negative for suicidal ideas, sleep disturbance and dysphoric mood. The patient is not nervous/anxious.        Objective:   Physical Exam  Constitutional: She is oriented to person, place, and time. She appears well-developed and well-nourished. No distress.  HENT:  Head: Normocephalic and atraumatic.  Right Ear: External ear normal.  Left Ear: External ear normal.  Nose: Nose normal.  Mouth/Throat: Oropharynx is clear and moist. No oropharyngeal exudate.  Eyes: Conjunctivae normal are normal.  Pupils are equal, round, and reactive to light. Right eye exhibits no discharge. Left eye exhibits no discharge. No scleral icterus.  Neck: Normal range of motion. Neck supple. No tracheal deviation present. No thyromegaly present.  Cardiovascular: Normal rate, regular rhythm, normal heart sounds and intact distal pulses.  Exam reveals no gallop and no friction rub.   No murmur heard. Pulmonary/Chest: Effort normal and breath sounds normal. No respiratory distress. She has no wheezes. She has no rales. She exhibits no tenderness.  Musculoskeletal: She exhibits no edema and no tenderness.       Right shoulder: She exhibits decreased range of motion and decreased strength.  Lymphadenopathy:    She has no cervical adenopathy.  Neurological: She is alert and oriented to person, place, and time. No cranial nerve deficit. She exhibits normal muscle tone. Coordination normal.  Skin: Skin is warm and dry. No rash noted. She is not diaphoretic. No erythema. No pallor.  Psychiatric: She has a normal mood and affect. Her behavior is normal. Judgment and thought content normal.          Assessment & Plan:

## 2012-05-19 NOTE — Assessment & Plan Note (Signed)
Recent A1c 7.8% (labs from nephrologist). Will continue Levemir.  We discussed goal A1c of <7% and focus on improved diet with limited carbohydrates. Will plan to repeat A1c 3 months.

## 2012-05-28 ENCOUNTER — Telehealth: Payer: Self-pay | Admitting: Internal Medicine

## 2012-06-07 ENCOUNTER — Emergency Department: Payer: Self-pay | Admitting: Emergency Medicine

## 2012-06-10 ENCOUNTER — Ambulatory Visit: Payer: Self-pay | Admitting: Vascular Surgery

## 2012-06-10 ENCOUNTER — Encounter (INDEPENDENT_AMBULATORY_CARE_PROVIDER_SITE_OTHER): Payer: Self-pay | Admitting: Ophthalmology

## 2012-06-10 LAB — BASIC METABOLIC PANEL
Anion Gap: 7 (ref 7–16)
Co2: 23 mmol/L (ref 21–32)
EGFR (African American): 12 — ABNORMAL LOW
EGFR (Non-African Amer.): 10 — ABNORMAL LOW
Sodium: 136 mmol/L (ref 136–145)

## 2012-06-10 LAB — CBC
HGB: 8.8 g/dL — ABNORMAL LOW (ref 12.0–16.0)
MCH: 33.1 pg (ref 26.0–34.0)
MCV: 101 fL — ABNORMAL HIGH (ref 80–100)
Platelet: 235 10*3/uL (ref 150–440)
RBC: 2.65 10*6/uL — ABNORMAL LOW (ref 3.80–5.20)

## 2012-06-18 ENCOUNTER — Ambulatory Visit: Payer: Self-pay | Admitting: Vascular Surgery

## 2012-06-18 LAB — POTASSIUM: Potassium: 4.6 mmol/L (ref 3.5–5.1)

## 2012-06-22 ENCOUNTER — Encounter (INDEPENDENT_AMBULATORY_CARE_PROVIDER_SITE_OTHER): Payer: Self-pay | Admitting: Ophthalmology

## 2012-06-26 ENCOUNTER — Emergency Department: Payer: Self-pay | Admitting: Emergency Medicine

## 2012-06-26 LAB — URINALYSIS, COMPLETE
Bilirubin,UR: NEGATIVE
Blood: NEGATIVE
Glucose,UR: 50 mg/dL (ref 0–75)
Hyaline Cast: 5
Leukocyte Esterase: NEGATIVE
Ph: 8 (ref 4.5–8.0)
Protein: >=500
RBC,UR: 3 /HPF (ref 0–5)
Squamous Epithelial: 2
WBC UR: 1 /HPF (ref 0–5)

## 2012-06-26 LAB — BASIC METABOLIC PANEL
Anion Gap: 10 (ref 7–16)
BUN: 32 mg/dL — ABNORMAL HIGH (ref 7–18)
Calcium, Total: 8.8 mg/dL (ref 8.5–10.1)
Co2: 27 mmol/L (ref 21–32)
Creatinine: 4.24 mg/dL — ABNORMAL HIGH (ref 0.60–1.30)
EGFR (Non-African Amer.): 11 — ABNORMAL LOW
Glucose: 138 mg/dL — ABNORMAL HIGH (ref 65–99)

## 2012-06-26 LAB — CBC
HGB: 8.7 g/dL — ABNORMAL LOW (ref 12.0–16.0)
MCH: 32.3 pg (ref 26.0–34.0)
MCV: 97 fL (ref 80–100)
RBC: 2.68 10*6/uL — ABNORMAL LOW (ref 3.80–5.20)
RDW: 12.8 % (ref 11.5–14.5)

## 2012-06-28 ENCOUNTER — Ambulatory Visit: Payer: Self-pay | Admitting: Orthopedic Surgery

## 2012-07-06 ENCOUNTER — Telehealth: Payer: Self-pay | Admitting: Internal Medicine

## 2012-07-06 NOTE — Telephone Encounter (Signed)
Pt calling requesting samples of Levemir insulin.  Please contact pt  [phone]

## 2012-07-07 ENCOUNTER — Encounter (INDEPENDENT_AMBULATORY_CARE_PROVIDER_SITE_OTHER): Payer: Self-pay | Admitting: Ophthalmology

## 2012-07-07 ENCOUNTER — Encounter (INDEPENDENT_AMBULATORY_CARE_PROVIDER_SITE_OTHER): Payer: BC Managed Care – PPO | Admitting: Ophthalmology

## 2012-07-07 DIAGNOSIS — E1165 Type 2 diabetes mellitus with hyperglycemia: Secondary | ICD-10-CM

## 2012-07-07 DIAGNOSIS — E11359 Type 2 diabetes mellitus with proliferative diabetic retinopathy without macular edema: Secondary | ICD-10-CM

## 2012-07-07 DIAGNOSIS — H35039 Hypertensive retinopathy, unspecified eye: Secondary | ICD-10-CM

## 2012-07-07 DIAGNOSIS — E1139 Type 2 diabetes mellitus with other diabetic ophthalmic complication: Secondary | ICD-10-CM

## 2012-07-07 DIAGNOSIS — I10 Essential (primary) hypertension: Secondary | ICD-10-CM

## 2012-07-07 DIAGNOSIS — H2510 Age-related nuclear cataract, unspecified eye: Secondary | ICD-10-CM

## 2012-07-07 NOTE — Telephone Encounter (Signed)
Patient advised that we do not have any samples of Levemir.

## 2012-07-27 ENCOUNTER — Emergency Department: Payer: Self-pay | Admitting: Emergency Medicine

## 2012-07-28 LAB — BASIC METABOLIC PANEL
BUN: 40 mg/dL — ABNORMAL HIGH (ref 7–18)
Calcium, Total: 7.6 mg/dL — ABNORMAL LOW (ref 8.5–10.1)
Chloride: 110 mmol/L — ABNORMAL HIGH (ref 98–107)
EGFR (Non-African Amer.): 12 — ABNORMAL LOW
Osmolality: 297 (ref 275–301)
Potassium: 4.9 mmol/L (ref 3.5–5.1)

## 2012-07-28 LAB — CBC
HCT: 23.3 % — ABNORMAL LOW (ref 35.0–47.0)
MCHC: 31.7 g/dL — ABNORMAL LOW (ref 32.0–36.0)
MCV: 99 fL (ref 80–100)
Platelet: 234 10*3/uL (ref 150–440)
WBC: 12.2 10*3/uL — ABNORMAL HIGH (ref 3.6–11.0)

## 2012-07-28 LAB — CK TOTAL AND CKMB (NOT AT ARMC): CK-MB: 3 ng/mL (ref 0.5–3.6)

## 2012-07-28 LAB — RAPID INFLUENZA A&B ANTIGENS

## 2012-07-28 LAB — TROPONIN I: Troponin-I: 0.04 ng/mL

## 2012-08-12 ENCOUNTER — Encounter (INDEPENDENT_AMBULATORY_CARE_PROVIDER_SITE_OTHER): Payer: BC Managed Care – PPO

## 2012-08-12 DIAGNOSIS — I6529 Occlusion and stenosis of unspecified carotid artery: Secondary | ICD-10-CM

## 2012-08-18 ENCOUNTER — Ambulatory Visit: Payer: Self-pay | Admitting: Internal Medicine

## 2012-08-20 ENCOUNTER — Ambulatory Visit (INDEPENDENT_AMBULATORY_CARE_PROVIDER_SITE_OTHER): Payer: BC Managed Care – PPO | Admitting: Internal Medicine

## 2012-08-20 ENCOUNTER — Encounter: Payer: Self-pay | Admitting: Internal Medicine

## 2012-08-20 VITALS — BP 160/76 | HR 75 | Temp 98.7°F | Ht 67.0 in | Wt 179.0 lb

## 2012-08-20 DIAGNOSIS — I10 Essential (primary) hypertension: Secondary | ICD-10-CM

## 2012-08-20 DIAGNOSIS — N185 Chronic kidney disease, stage 5: Secondary | ICD-10-CM

## 2012-08-20 DIAGNOSIS — I152 Hypertension secondary to endocrine disorders: Secondary | ICD-10-CM

## 2012-08-20 DIAGNOSIS — E1121 Type 2 diabetes mellitus with diabetic nephropathy: Secondary | ICD-10-CM

## 2012-08-20 DIAGNOSIS — E1129 Type 2 diabetes mellitus with other diabetic kidney complication: Secondary | ICD-10-CM

## 2012-08-20 DIAGNOSIS — E1169 Type 2 diabetes mellitus with other specified complication: Secondary | ICD-10-CM

## 2012-08-20 DIAGNOSIS — N058 Unspecified nephritic syndrome with other morphologic changes: Secondary | ICD-10-CM

## 2012-08-20 NOTE — Assessment & Plan Note (Signed)
BP Readings from Last 3 Encounters:  08/20/12 160/76  05/19/12 150/82  03/19/12 140/80   Patient reports blood pressure has been improved since starting dialysis. Will request notes from [place] [facility]. Continue current medications.

## 2012-08-20 NOTE — Progress Notes (Signed)
Subjective:    Patient ID: Theresa [name], female    DOB: Aug 23, 1955, 57 y.o.   MRN: [id number]  HPI 57 year old female with history of diabetes, hypertension, hyperlipidemia, and chronic kidney disease stage V now on hemodialysis presents for followup. She reports she is feeling well. She reports symptoms of myalgia and arthralgia have improved significantly since starting dialysis. She reports that her left arm fistula has been functioning well. She has not had any issues with blood pressure. She reports blood pressure has been better controlled with no episodes of headache or chest pain. In regards to blood sugar, she reports blood sugars have been well-controlled but did not bring record with her today. She reports compliance with Levemir. She denies any new concerns today.  Outpatient Encounter Prescriptions as of 08/20/2012  Medication Sig Dispense Refill  . carvedilol (COREG) 6.25 MG tablet Take 6.25 mg by mouth 2 (two) times daily.      . furosemide (LASIX) 40 MG tablet Take 1 tablet (40 mg total) by mouth daily.  30 tablet  6  . gabapentin (NEURONTIN) 800 MG tablet Take 1 tablet (800 mg total) by mouth 2 (two) times daily.  60 tablet  6  . insulin detemir (LEVEMIR FLEXPEN) 100 UNIT/ML injection Inject 30 Units into the skin daily.  10 mL  3  . losartan (COZAAR) 25 MG tablet Take 25 mg by mouth daily.      . simvastatin (ZOCOR) 40 MG tablet Take 1 tablet (40 mg total) by mouth at bedtime.  30 tablet  6  . gatifloxacin (ZYMAXID) 0.5 % SOLN Place 1 drop into the left eye 4 (four) times daily.      . prednisoLONE acetate (PRED FORTE) 1 % ophthalmic suspension Place 1 drop into the left eye 4 (four) times daily.  5 mL    . [DISCONTINUED] HYDROcodone-acetaminophen (VICODIN) 5-500 MG per tablet Take 1 tablet by mouth every 6 (six) hours as needed. For pain  30 tablet  0   No facility-administered encounter medications on file as of 08/20/2012.   BP 160/76  Pulse 75  Temp(Src) 98.7 F (37.1 C)  (Oral)  Ht 5\' 7"  (1.702 m)  Wt 179 lb (81.194 kg)  BMI 28.03 kg/m2  SpO2 100%  Review of Systems  Constitutional: Negative for fever, chills, appetite change, fatigue and unexpected weight change.  HENT: Negative for ear pain, congestion, sore throat, trouble swallowing, neck pain, voice change and sinus pressure.   Eyes: Negative for visual disturbance.  Respiratory: Negative for cough, shortness of breath, wheezing and stridor.   Cardiovascular: Negative for chest pain, palpitations and leg swelling.  Gastrointestinal: Negative for nausea, vomiting, abdominal pain, diarrhea, constipation, blood in stool, abdominal distention and anal bleeding.  Genitourinary: Negative for dysuria and flank pain.  Musculoskeletal: Negative for myalgias, arthralgias and gait problem.  Skin: Negative for color change and rash.  Neurological: Negative for dizziness and headaches.  Hematological: Negative for adenopathy. Does not bruise/bleed easily.  Psychiatric/Behavioral: Negative for suicidal ideas, sleep disturbance and dysphoric mood. The patient is not nervous/anxious.        Objective:   Physical Exam  Constitutional: She is oriented to person, place, and time. She appears well-developed and well-nourished. No distress.  HENT:  Head: Normocephalic and atraumatic.  Right Ear: External ear normal.  Left Ear: External ear normal.  Nose: Nose normal.  Mouth/Throat: Oropharynx is clear and moist. No oropharyngeal exudate.  Eyes: Conjunctivae are normal. Pupils are equal, round, and reactive to light.  Right eye exhibits no discharge. Left eye exhibits no discharge. No scleral icterus.  Neck: Normal range of motion. Neck supple. No tracheal deviation present. No thyromegaly present.  Cardiovascular: Normal rate, regular rhythm, normal heart sounds and intact distal pulses.  Exam reveals no gallop and no friction rub.   No murmur heard. Pulmonary/Chest: Effort normal and breath sounds normal. No  respiratory distress. She has no wheezes. She has no rales. She exhibits no tenderness.  Musculoskeletal: Normal range of motion. She exhibits no edema and no tenderness.  Lymphadenopathy:    She has no cervical adenopathy.  Neurological: She is alert and oriented to person, place, and time. No cranial nerve deficit. She exhibits normal muscle tone. Coordination normal.  Skin: Skin is warm and dry. No rash noted. She is not diaphoretic. No erythema. No pallor.     Psychiatric: She has a normal mood and affect. Her behavior is normal. Judgment and thought content normal.          Assessment & Plan:

## 2012-08-20 NOTE — Assessment & Plan Note (Signed)
Patient reports blood sugars are well controlled on current medications. Samples of Levemir given today. Will request records on recent A1c which was drawn through dialysis. Followup in 3 months.

## 2012-08-20 NOTE — Assessment & Plan Note (Signed)
Patient has started hemodialysis through left arm graft. Tolerating well. Will request notes on recent labs.

## 2012-08-25 ENCOUNTER — Telehealth: Payer: Self-pay | Admitting: Internal Medicine

## 2012-08-25 DIAGNOSIS — R928 Other abnormal and inconclusive findings on diagnostic imaging of breast: Secondary | ICD-10-CM

## 2012-08-25 NOTE — Telephone Encounter (Signed)
Patient informed and verbally agreed understanding. They did not set this up, she will need [place] to schedule.

## 2012-08-25 NOTE — Telephone Encounter (Signed)
They saw asymmetry in right breast, but had no images for comparison. They should have set up additional views. If not, we should schedule.

## 2012-08-25 NOTE — Telephone Encounter (Signed)
Patient has not been set up for compressed mammogram but would like to know why are they recommending

## 2012-08-25 NOTE — Telephone Encounter (Signed)
Mammogram 3/18 recommended compression views of right breast [facility]). Has this been scheduled?

## 2012-08-31 ENCOUNTER — Telehealth: Payer: Self-pay

## 2012-08-31 NOTE — Telephone Encounter (Signed)
Low risk procedure, should be acceptable risk to proceed with cataract surgery

## 2012-08-31 NOTE — Telephone Encounter (Signed)
Received correspondence from [facility] asking for cardiac clearance for cataract surg Is pt cleared? Will fax response to [phone]

## 2012-09-01 NOTE — Telephone Encounter (Signed)
Letter sent to MD

## 2012-09-10 ENCOUNTER — Encounter: Payer: Self-pay | Admitting: Internal Medicine

## 2012-09-10 ENCOUNTER — Telehealth: Payer: Self-pay

## 2012-09-10 NOTE — Telephone Encounter (Signed)
Start crestor 10 mg for a week, then 20 mg for two weeks, then increase up to 40 mg daily

## 2012-09-10 NOTE — Telephone Encounter (Signed)
Patient was told need to change her cholesterol medication simvastatin to crestor but there is no note in chart showing this information. Please advise what dose of crestor if she is to change. Please call in to [facility] on [address].

## 2012-09-10 NOTE — Telephone Encounter (Signed)
Notified patient per Dr. Rockey Situ need to stop simvastatin and start on crestor 10 mg for one week, increase to crestor 20 mg for two weeks and if tolerates that dose increase to crestor 40 mg take one tablet daily at bedtime. Told patient should come by on Monday to pick up samples and coupon.

## 2012-09-14 ENCOUNTER — Encounter: Payer: Self-pay | Admitting: Internal Medicine

## 2012-09-16 ENCOUNTER — Encounter: Payer: Self-pay | Admitting: Internal Medicine

## 2012-09-20 ENCOUNTER — Other Ambulatory Visit: Payer: Self-pay | Admitting: Internal Medicine

## 2012-09-25 ENCOUNTER — Emergency Department: Payer: Self-pay | Admitting: Internal Medicine

## 2012-09-28 ENCOUNTER — Telehealth: Payer: Self-pay

## 2012-09-28 NOTE — Telephone Encounter (Signed)
lmtcb

## 2012-09-28 NOTE — Telephone Encounter (Signed)
Pt would like rx for crestor 20 mg

## 2012-09-29 ENCOUNTER — Other Ambulatory Visit: Payer: Self-pay | Admitting: *Deleted

## 2012-09-29 MED ORDER — ROSUVASTATIN CALCIUM 20 MG PO TABS: 20.0000 mg | ORAL_TABLET | Freq: Every day | ORAL | Status: DC

## 2012-09-29 NOTE — Telephone Encounter (Signed)
Refill sent for Crestor 20 mg sent to [facility]. Pt mentioned that this is her 3rd wk on crestor and is still currently taking the 20 mg for the 2 wks then will increase to 40 mg qd as directed in telephone note per [facility] 09/10/12.

## 2012-10-13 ENCOUNTER — Other Ambulatory Visit: Payer: Self-pay | Admitting: Internal Medicine

## 2012-11-08 DIAGNOSIS — E785 Hyperlipidemia, unspecified: Secondary | ICD-10-CM | POA: Insufficient documentation

## 2012-11-08 DIAGNOSIS — N186 End stage renal disease: Secondary | ICD-10-CM | POA: Insufficient documentation

## 2012-11-08 DIAGNOSIS — E119 Type 2 diabetes mellitus without complications: Secondary | ICD-10-CM | POA: Insufficient documentation

## 2012-11-08 DIAGNOSIS — I10 Essential (primary) hypertension: Secondary | ICD-10-CM | POA: Insufficient documentation

## 2012-11-09 ENCOUNTER — Telehealth: Payer: Self-pay | Admitting: Internal Medicine

## 2012-11-09 MED ORDER — INSULIN PEN NEEDLE 32G X 6 MM MISC: 1.0000 [IU] | Status: DC | PRN

## 2012-11-09 NOTE — Telephone Encounter (Signed)
Rx sent 

## 2012-11-09 NOTE — Telephone Encounter (Signed)
Patient needing a refill on her insuline needles.

## 2012-11-11 ENCOUNTER — Ambulatory Visit: Payer: BC Managed Care – PPO | Admitting: Internal Medicine

## 2012-11-11 ENCOUNTER — Encounter: Payer: BC Managed Care – PPO | Admitting: Internal Medicine

## 2012-11-12 ENCOUNTER — Encounter: Payer: Self-pay | Admitting: Internal Medicine

## 2012-11-12 ENCOUNTER — Ambulatory Visit (INDEPENDENT_AMBULATORY_CARE_PROVIDER_SITE_OTHER): Payer: BC Managed Care – PPO | Admitting: Internal Medicine

## 2012-11-12 VITALS — BP 148/70 | HR 79 | Temp 98.8°F | Ht 67.5 in | Wt 176.0 lb

## 2012-11-12 DIAGNOSIS — E1129 Type 2 diabetes mellitus with other diabetic kidney complication: Secondary | ICD-10-CM

## 2012-11-12 DIAGNOSIS — N185 Chronic kidney disease, stage 5: Secondary | ICD-10-CM

## 2012-11-12 DIAGNOSIS — E1121 Type 2 diabetes mellitus with diabetic nephropathy: Secondary | ICD-10-CM

## 2012-11-12 DIAGNOSIS — Z0000 Encounter for general adult medical examination without abnormal findings: Secondary | ICD-10-CM | POA: Insufficient documentation

## 2012-11-12 DIAGNOSIS — E785 Hyperlipidemia, unspecified: Secondary | ICD-10-CM

## 2012-11-12 DIAGNOSIS — N058 Unspecified nephritic syndrome with other morphologic changes: Secondary | ICD-10-CM

## 2012-11-12 LAB — LDL CHOLESTEROL, DIRECT: Direct LDL: 100.6 mg/dL

## 2012-11-12 LAB — LIPID PANEL
Cholesterol: 206 mg/dL — ABNORMAL HIGH (ref 0–200)
HDL: 84.6 mg/dL (ref >39.00)
Total CHOL/HDL Ratio: 2
Triglycerides: 70 mg/dL (ref 0.0–149.0)
VLDL: 14 mg/dL (ref 0.0–40.0)

## 2012-11-12 MED ORDER — ROSUVASTATIN CALCIUM 40 MG PO TABS: 40.0000 mg | ORAL_TABLET | Freq: Every day | ORAL | Status: DC

## 2012-11-12 MED ORDER — TUBERCULIN PPD 5 UNIT/0.1ML ID SOLN: 5.0000 [IU] | Freq: Once | INTRADERMAL | Status: DC

## 2012-11-12 MED ORDER — INSULIN DETEMIR 100 UNIT/ML ~~LOC~~ SOLN: 20.0000 [IU] | Freq: Every day | SUBCUTANEOUS | Status: DC

## 2012-11-12 MED ORDER — GABAPENTIN 800 MG PO TABS: 800.0000 mg | ORAL_TABLET | Freq: Three times a day (TID) | ORAL | Status: DC

## 2012-11-12 NOTE — Assessment & Plan Note (Signed)
Blood sugars recently have been low. Will reduce dose of Levemir to 20 units daily. If persistent sugars less than 70, would favor cutting back even further. Patient will call if any persistent sugars less than 70. Followup in 3 months or as needed.

## 2012-11-12 NOTE — Progress Notes (Signed)
Subjective:    Patient ID: Theresa [name], female    DOB: May 09, 1956, 57 y.o.   MRN: [id number]  HPI 57 year old female with history of end-stage renal disease on hemodialysis, diabetes, hypertension presents for annual exam. She reports she is generally doing well. She reports blood sugars have been slightly lower than previous with frequent low blood sugars in the 50s. She is symptomatic with lightheadedness and fatigue during these episodes. They resolve ID or drinking something. She has been compliant with her Levemir and is taking 30 units daily.  In regards to health maintenance she reports last Pap smear was 2012 was normal. She has never had an abnormal Pap. She is status post hysterectomy. Recent mammogram was also normal. Immunizations are up to date. She does need PPD screening prior to continued use of dialysis this year.  Outpatient Encounter Prescriptions as of 11/12/2012  Medication Sig Dispense Refill  . carvedilol (COREG) 6.25 MG tablet Take 6.25 mg by mouth 2 (two) times daily.      Marland Kitchen gabapentin (NEURONTIN) 800 MG tablet Take 1 tablet (800 mg total) by mouth 3 (three) times daily.  90 tablet  6  . gatifloxacin (ZYMAXID) 0.5 % SOLN Place 1 drop into the left eye 4 (four) times daily.      . insulin detemir (LEVEMIR) 100 UNIT/ML injection Inject 0.2 mLs (20 Units total) into the skin daily.  10 mL  3  . Insulin Pen Needle 32G X 6 MM MISC 1 Units by Does not apply route as needed.  90 each  3  . rosuvastatin (CRESTOR) 40 MG tablet Take 1 tablet (40 mg total) by mouth daily.  30 tablet  6  . furosemide (LASIX) 40 MG tablet Take 1 tablet (40 mg total) by mouth daily.  30 tablet  6  . losartan (COZAAR) 25 MG tablet Take 25 mg by mouth daily.      . prednisoLONE acetate (PRED FORTE) 1 % ophthalmic suspension Place 1 drop into the left eye 4 (four) times daily.  5 mL    . [DISCONTINUED] simvastatin (ZOCOR) 40 MG tablet Take 1 tablet (40 mg total) by mouth at bedtime.  30 tablet  6    Facility-Administered Encounter Medications as of 11/12/2012  Medication Dose Route Frequency Provider Last Rate Last Dose  . tuberculin injection 5 Units  5 Units Intradermal Once Rica Mast, MD       BP 148/70  Pulse 79  Temp(Src) 98.8 F (37.1 C) (Oral)  Ht 5' 7.5" (1.715 m)  Wt 176 lb (79.833 kg)  BMI 27.14 kg/m2  SpO2 96%  Review of Systems  Constitutional: Negative for fever, chills, appetite change, fatigue and unexpected weight change.  HENT: Negative for ear pain, congestion, sore throat, trouble swallowing, neck pain, voice change and sinus pressure.   Eyes: Negative for visual disturbance.  Respiratory: Negative for cough, shortness of breath, wheezing and stridor.   Cardiovascular: Negative for chest pain, palpitations and leg swelling.  Gastrointestinal: Negative for nausea, vomiting, abdominal pain, diarrhea, constipation, blood in stool, abdominal distention and anal bleeding.  Genitourinary: Negative for dysuria and flank pain.  Musculoskeletal: Negative for myalgias, arthralgias and gait problem.  Skin: Negative for color change and rash.  Neurological: Negative for dizziness and headaches.  Hematological: Negative for adenopathy. Does not bruise/bleed easily.  Psychiatric/Behavioral: Negative for suicidal ideas, sleep disturbance and dysphoric mood. The patient is not nervous/anxious.        Objective:   Physical Exam  Constitutional:  She is oriented to person, place, and time. She appears well-developed and well-nourished. No distress.  HENT:  Head: Normocephalic and atraumatic.  Right Ear: External ear normal.  Left Ear: External ear normal.  Nose: Nose normal.  Mouth/Throat: Oropharynx is clear and moist. No oropharyngeal exudate.  Eyes: Conjunctivae are normal. Pupils are equal, round, and reactive to light. Right eye exhibits no discharge. Left eye exhibits no discharge. No scleral icterus.  Neck: Normal range of motion. Neck supple. No  tracheal deviation present. No thyromegaly present.  Cardiovascular: Normal rate, regular rhythm, normal heart sounds and intact distal pulses.  Exam reveals no gallop and no friction rub.   No murmur heard. Pulmonary/Chest: Effort normal and breath sounds normal. No accessory muscle usage. Not tachypneic. No respiratory distress. She has no decreased breath sounds. She has no wheezes. She has no rhonchi. She has no rales. She exhibits no tenderness. Right breast exhibits no inverted nipple, no mass, no nipple discharge, no skin change and no tenderness. Left breast exhibits no inverted nipple, no mass, no nipple discharge, no skin change and no tenderness. Breasts are symmetrical.  Abdominal: Soft. Bowel sounds are normal. She exhibits no distension and no mass. There is no tenderness. There is no rebound and no guarding.  Musculoskeletal: Normal range of motion. She exhibits no edema and no tenderness.  Lymphadenopathy:    She has no cervical adenopathy.  Neurological: She is alert and oriented to person, place, and time. No cranial nerve deficit. She exhibits normal muscle tone. Coordination normal.  Skin: Skin is warm and dry. No rash noted. She is not diaphoretic. No erythema. No pallor.  Psychiatric: She has a normal mood and affect. Her behavior is normal. Judgment and thought content normal.          Assessment & Plan:

## 2012-11-12 NOTE — Assessment & Plan Note (Signed)
General medical exam including breast exam normal today Pap and pelvic deferred as normal in 2012 and patient status post hysterectomy. Health maintenance including vaccinations are up-to-date. Given that patient will be going to a new dialysis facility, will check HIV, hepatitis B and C. serologies, and place PPD. Mammogram up-to-date. Encourage continued efforts at healthy diet and physical activity.

## 2012-11-15 ENCOUNTER — Other Ambulatory Visit: Payer: Self-pay | Admitting: Internal Medicine

## 2012-11-15 ENCOUNTER — Encounter: Payer: Self-pay | Admitting: *Deleted

## 2012-11-15 DIAGNOSIS — Z0000 Encounter for general adult medical examination without abnormal findings: Secondary | ICD-10-CM

## 2012-12-18 LAB — HM DIABETES EYE EXAM

## 2012-12-21 ENCOUNTER — Encounter: Payer: Self-pay | Admitting: Internal Medicine

## 2013-01-07 ENCOUNTER — Telehealth: Payer: Self-pay | Admitting: Internal Medicine

## 2013-01-07 ENCOUNTER — Other Ambulatory Visit: Payer: Self-pay | Admitting: Internal Medicine

## 2013-01-07 NOTE — Telephone Encounter (Signed)
Can you please ask pt dates of last foot and eye exam?

## 2013-01-07 NOTE — Telephone Encounter (Signed)
She actually has an eye appointment scheduled for an eye exam on 8/13 and follow up here on 9/10 according to our records.

## 2013-01-19 ENCOUNTER — Ambulatory Visit (INDEPENDENT_AMBULATORY_CARE_PROVIDER_SITE_OTHER): Payer: BC Managed Care – PPO | Admitting: Ophthalmology

## 2013-01-21 ENCOUNTER — Encounter: Payer: Self-pay | Admitting: Internal Medicine

## 2013-01-21 ENCOUNTER — Ambulatory Visit (INDEPENDENT_AMBULATORY_CARE_PROVIDER_SITE_OTHER): Payer: BC Managed Care – PPO | Admitting: Internal Medicine

## 2013-01-21 VITALS — BP 142/72 | HR 74 | Temp 98.6°F | Wt 178.0 lb

## 2013-01-21 DIAGNOSIS — N058 Unspecified nephritic syndrome with other morphologic changes: Secondary | ICD-10-CM

## 2013-01-21 DIAGNOSIS — IMO0001 Reserved for inherently not codable concepts without codable children: Secondary | ICD-10-CM

## 2013-01-21 DIAGNOSIS — E1121 Type 2 diabetes mellitus with diabetic nephropathy: Secondary | ICD-10-CM

## 2013-01-21 DIAGNOSIS — I10 Essential (primary) hypertension: Secondary | ICD-10-CM

## 2013-01-21 DIAGNOSIS — R55 Syncope and collapse: Secondary | ICD-10-CM

## 2013-01-21 DIAGNOSIS — E1129 Type 2 diabetes mellitus with other diabetic kidney complication: Secondary | ICD-10-CM

## 2013-01-21 DIAGNOSIS — F411 Generalized anxiety disorder: Secondary | ICD-10-CM

## 2013-01-21 MED ORDER — LOSARTAN POTASSIUM 25 MG PO TABS: 100.0000 mg | ORAL_TABLET | Freq: Every day | ORAL | Status: DC

## 2013-01-21 MED ORDER — SERTRALINE HCL 25 MG PO TABS: 25.0000 mg | ORAL_TABLET | Freq: Every day | ORAL | Status: DC

## 2013-01-21 MED ORDER — CARVEDILOL 6.25 MG PO TABS: 6.2500 mg | ORAL_TABLET | Freq: Two times a day (BID) | ORAL | Status: DC

## 2013-01-21 NOTE — Progress Notes (Signed)
Subjective:    Patient ID: Theresa [name], female    DOB: 01/03/1956, 57 y.o.   MRN: [id number]  HPI 57 year old female with history of chronic kidney disease on hemodialysis, diabetes, hypertension, hyperlipidemia presents for acute visit. She reports that she is generally been feeling poorly. She reports generalized fatigue. She also notes diffuse muscle aching in her legs. This is been ongoing for several weeks. She denies any new medications. She denies any trauma to her legs her back. Aching pain is made worse by increase physical activity. She denies any swelling in her legs. She denies weakness in her legs.  She is also concerned about frequent episodes of syncope during dialysis. She reports that she doesn't have any preceding symptoms but often wakes in her dialysis chair after fainting. The nurses have recorded this. She is unsure what the plan of action. She thinks of syncope may be secondary to low blood pressure. She denies any diaphoresis, shortness of breath, chest pain, palpitations. She does not have episodes of syncope outside of dialysis.  She also notes some anxiety recently. She reports anxiety about potential upcoming kidney transplant as well as about her ongoing dialysis. She has never taken medication to help with anxiety. She notes difficulty falling asleep and staying asleep.  Outpatient Encounter Prescriptions as of 01/21/2013  Medication Sig Dispense Refill  . carvedilol (COREG) 6.25 MG tablet Take 1 tablet (6.25 mg total) by mouth 2 (two) times daily.  60 tablet  6  . gabapentin (NEURONTIN) 800 MG tablet Take 1 tablet (800 mg total) by mouth 3 (three) times daily.  90 tablet  6  . insulin detemir (LEVEMIR) 100 UNIT/ML injection Inject 0.2 mLs (20 Units total) into the skin daily.  10 mL  3  . Insulin Pen Needle 32G X 6 MM MISC 1 Units by Does not apply route as needed.  90 each  3  . losartan (COZAAR) 25 MG tablet Take 4 tablets (100 mg total) by mouth daily.  30 tablet   6  . rosuvastatin (CRESTOR) 40 MG tablet Take 1 tablet (40 mg total) by mouth daily.  30 tablet  6  . furosemide (LASIX) 40 MG tablet Take 1 tablet (40 mg total) by mouth daily.  30 tablet  6  . gatifloxacin (ZYMAXID) 0.5 % SOLN Place 1 drop into the left eye 4 (four) times daily.      . prednisoLONE acetate (PRED FORTE) 1 % ophthalmic suspension Place 1 drop into the left eye 4 (four) times daily.  5 mL    . sertraline (ZOLOFT) 25 MG tablet Take 1 tablet (25 mg total) by mouth daily.  30 tablet  3  . [DISCONTINUED] tuberculin injection 5 Units        No facility-administered encounter medications on file as of 01/21/2013.   BP 142/72  Pulse 74  Temp(Src) 98.6 F (37 C) (Oral)  Wt 178 lb (80.74 kg)  BMI 27.45 kg/m2  SpO2 98%  Review of Systems  Constitutional: Positive for fatigue. Negative for fever, chills, appetite change and unexpected weight change.  HENT: Negative for ear pain, congestion, sore throat, trouble swallowing, neck pain, voice change and sinus pressure.   Eyes: Negative for visual disturbance.  Respiratory: Negative for cough, shortness of breath, wheezing and stridor.   Cardiovascular: Negative for chest pain, palpitations and leg swelling.  Gastrointestinal: Negative for nausea, vomiting, abdominal pain, diarrhea, constipation, blood in stool, abdominal distention and anal bleeding.  Genitourinary: Negative for dysuria and flank pain.  Musculoskeletal: Positive for myalgias and arthralgias. Negative for gait problem.  Skin: Negative for color change and rash.  Neurological: Positive for syncope and light-headedness. Negative for dizziness and headaches.  Hematological: Negative for adenopathy. Does not bruise/bleed easily.  Psychiatric/Behavioral: Positive for sleep disturbance. Negative for suicidal ideas and dysphoric mood. The patient is nervous/anxious.        Objective:   Physical Exam  Constitutional: She is oriented to person, place, and time. She appears  well-developed and well-nourished. No distress.  HENT:  Head: Normocephalic and atraumatic.  Right Ear: External ear normal.  Left Ear: External ear normal.  Nose: Nose normal.  Mouth/Throat: Oropharynx is clear and moist. No oropharyngeal exudate.  Eyes: Conjunctivae are normal. Pupils are equal, round, and reactive to light. Right eye exhibits no discharge. Left eye exhibits no discharge. No scleral icterus.  Neck: Normal range of motion. Neck supple. No tracheal deviation present. No thyromegaly present.  Cardiovascular: Normal rate, regular rhythm, normal heart sounds and intact distal pulses.  Exam reveals no gallop and no friction rub.   No murmur heard. Pulmonary/Chest: Effort normal and breath sounds normal. No accessory muscle usage. Not tachypneic. No respiratory distress. She has no decreased breath sounds. She has no wheezes. She has no rhonchi. She has no rales. She exhibits no tenderness.  Musculoskeletal: Normal range of motion. She exhibits no edema and no tenderness.  Lymphadenopathy:    She has no cervical adenopathy.  Neurological: She is alert and oriented to person, place, and time. No cranial nerve deficit. She exhibits normal muscle tone. Coordination normal.  Skin: Skin is warm and dry. No rash noted. She is not diaphoretic. No erythema. No pallor.  Psychiatric: She has a normal mood and affect. Her behavior is normal. Judgment and thought content normal.          Assessment & Plan:

## 2013-01-21 NOTE — Assessment & Plan Note (Signed)
Patient reports blood sugars have been well-controlled. She denies any recent low blood sugars less than 70. Will request recent A1c which was drawn at dialysis.

## 2013-01-21 NOTE — Assessment & Plan Note (Signed)
Suspect recent episodes of myalgia may be secondary to use of Procrit. We also discussed that Crestor may cause similar symptoms. Will check CK with labs today. If elevated, will stop Crestor. Suggested using Tylenol as needed for pain. Will continue gabapentin as well.

## 2013-01-21 NOTE — Assessment & Plan Note (Signed)
Unclear etiology of syncope. Given that this occurs only during dialysis, suspect hypotension. Will discuss with her nephrologist. Alternative etiologies would include arrhythmia. However, exam is normal today and recent EKG is normal. Arrhythmia less likely given no symptoms outside of dialysis. Will avoid sedating medications. Will request recent labs including electrolytes from dialysis Center.

## 2013-01-21 NOTE — Assessment & Plan Note (Signed)
Will try adding sertraline 25 mg daily. We discussed potentially adding medications to help with sleep but will hold off for now given that she is on dialysis and given recent episodes of syncope. Followup in 4 weeks or sooner as needed.

## 2013-01-31 ENCOUNTER — Encounter: Payer: Self-pay | Admitting: Internal Medicine

## 2013-01-31 ENCOUNTER — Ambulatory Visit (INDEPENDENT_AMBULATORY_CARE_PROVIDER_SITE_OTHER): Payer: BC Managed Care – PPO | Admitting: Internal Medicine

## 2013-01-31 VITALS — BP 182/80 | HR 75 | Temp 98.2°F | Resp 14 | Wt 177.2 lb

## 2013-01-31 DIAGNOSIS — E1169 Type 2 diabetes mellitus with other specified complication: Secondary | ICD-10-CM

## 2013-01-31 DIAGNOSIS — I152 Hypertension secondary to endocrine disorders: Secondary | ICD-10-CM

## 2013-01-31 DIAGNOSIS — N058 Unspecified nephritic syndrome with other morphologic changes: Secondary | ICD-10-CM

## 2013-01-31 DIAGNOSIS — E1121 Type 2 diabetes mellitus with diabetic nephropathy: Secondary | ICD-10-CM

## 2013-01-31 DIAGNOSIS — I10 Essential (primary) hypertension: Secondary | ICD-10-CM

## 2013-01-31 DIAGNOSIS — N185 Chronic kidney disease, stage 5: Secondary | ICD-10-CM

## 2013-01-31 DIAGNOSIS — R0989 Other specified symptoms and signs involving the circulatory and respiratory systems: Secondary | ICD-10-CM

## 2013-01-31 DIAGNOSIS — F411 Generalized anxiety disorder: Secondary | ICD-10-CM

## 2013-01-31 DIAGNOSIS — R9439 Abnormal result of other cardiovascular function study: Secondary | ICD-10-CM | POA: Insufficient documentation

## 2013-01-31 DIAGNOSIS — E1129 Type 2 diabetes mellitus with other diabetic kidney complication: Secondary | ICD-10-CM

## 2013-01-31 DIAGNOSIS — E1159 Type 2 diabetes mellitus with other circulatory complications: Secondary | ICD-10-CM

## 2013-01-31 MED ORDER — SERTRALINE HCL 50 MG PO TABS: 50.0000 mg | ORAL_TABLET | Freq: Every day | ORAL | Status: DC

## 2013-01-31 NOTE — Progress Notes (Signed)
Subjective:    Patient ID: Theresa [name], female    DOB: September 27, 1955, 57 y.o.   MRN: [id number]  HPI 57 year old female with history of diabetes, hypertension, hyperlipidemia, end-stage renal disease on hemodialysis awaiting transplant presents for followup. At her last visit, she described increased anxiety. She was started on sertraline. She reports no improvement with this. She continues to describe anxious mood related to upcoming renal transplant. She sometimes has difficulty sleeping.  She reports she was recently evaluated at [facility] with a stress test. She has not yet received results of that study. She denies any recent chest pain, palpitations, dyspnea.  Outpatient Encounter Prescriptions as of 01/31/2013  Medication Sig Dispense Refill  . carvedilol (COREG) 6.25 MG tablet Take 1 tablet (6.25 mg total) by mouth 2 (two) times daily.  60 tablet  6  . gabapentin (NEURONTIN) 800 MG tablet Take 1 tablet (800 mg total) by mouth 3 (three) times daily.  90 tablet  6  . insulin detemir (LEVEMIR) 100 UNIT/ML injection Inject 0.2 mLs (20 Units total) into the skin daily.  10 mL  3  . Insulin Pen Needle 32G X 6 MM MISC 1 Units by Does not apply route as needed.  90 each  3  . losartan (COZAAR) 25 MG tablet Take 4 tablets (100 mg total) by mouth daily.  30 tablet  6  . rosuvastatin (CRESTOR) 40 MG tablet Take 1 tablet (40 mg total) by mouth daily.  30 tablet  6  . sertraline (ZOLOFT) 50 MG tablet Take 1 tablet (50 mg total) by mouth daily.  30 tablet  3  . [DISCONTINUED] sertraline (ZOLOFT) 25 MG tablet Take 1 tablet (25 mg total) by mouth daily.  30 tablet  3  . furosemide (LASIX) 40 MG tablet Take 1 tablet (40 mg total) by mouth daily.  30 tablet  6  . gatifloxacin (ZYMAXID) 0.5 % SOLN Place 1 drop into the left eye 4 (four) times daily.      . prednisoLONE acetate (PRED FORTE) 1 % ophthalmic suspension Place 1 drop into the left eye 4 (four) times daily.  5 mL     No facility-administered encounter  medications on file as of 01/31/2013.   BP 182/80  Pulse 75  Temp(Src) 98.2 F (36.8 C) (Oral)  Resp 14  Wt 177 lb 4 oz (80.4 kg)  BMI 27.34 kg/m2  SpO2 98%  Review of Systems  Constitutional: Negative for fever, chills, appetite change, fatigue and unexpected weight change.  HENT: Negative for ear pain, congestion, sore throat, trouble swallowing, neck pain, voice change and sinus pressure.   Eyes: Negative for visual disturbance.  Respiratory: Negative for cough, shortness of breath, wheezing and stridor.   Cardiovascular: Negative for chest pain, palpitations and leg swelling.  Gastrointestinal: Negative for nausea, vomiting, abdominal pain, diarrhea, constipation, blood in stool, abdominal distention and anal bleeding.  Genitourinary: Negative for dysuria and flank pain.  Musculoskeletal: Positive for myalgias and arthralgias. Negative for gait problem.  Skin: Negative for color change and rash.  Neurological: Negative for dizziness and headaches.  Hematological: Negative for adenopathy. Does not bruise/bleed easily.  Psychiatric/Behavioral: Negative for suicidal ideas, sleep disturbance and dysphoric mood. The patient is nervous/anxious.        Objective:   Physical Exam  Constitutional: She is oriented to person, place, and time. She appears well-developed and well-nourished. No distress.  HENT:  Head: Normocephalic and atraumatic.  Right Ear: External ear normal.  Left Ear: External ear normal.  Nose: Nose normal.  Mouth/Throat: Oropharynx is clear and moist. No oropharyngeal exudate.  Eyes: Conjunctivae are normal. Pupils are equal, round, and reactive to light. Right eye exhibits no discharge. Left eye exhibits no discharge. No scleral icterus.  Neck: Normal range of motion. Neck supple. Carotid bruit is present (left). No tracheal deviation present. No thyromegaly present.  Cardiovascular: Normal rate, regular rhythm, normal heart sounds and intact distal pulses.  Exam  reveals no gallop and no friction rub.   No murmur heard. Pulmonary/Chest: Effort normal and breath sounds normal. No accessory muscle usage. Not tachypneic. No respiratory distress. She has no decreased breath sounds. She has no wheezes. She has no rhonchi. She has no rales. She exhibits no tenderness.  Musculoskeletal: Normal range of motion. She exhibits no edema and no tenderness.  Lymphadenopathy:    She has no cervical adenopathy.  Neurological: She is alert and oriented to person, place, and time. No cranial nerve deficit. She exhibits normal muscle tone. Coordination normal.  Skin: Skin is warm and dry. No rash noted. She is not diaphoretic. No erythema. No pallor.  Psychiatric: She has a normal mood and affect. Her behavior is normal. Judgment and thought content normal.          Assessment & Plan:

## 2013-01-31 NOTE — Assessment & Plan Note (Signed)
Reviewed recent perfusion study from [facility] which was abnormal. Question if she may need cardiac catheterization for definitive testing. Will discuss with her local cardiologist.

## 2013-01-31 NOTE — Assessment & Plan Note (Signed)
BP Readings from Last 3 Encounters:  01/31/13 182/80  01/21/13 142/72  11/12/12 148/70   BP elevated today, however having episodes of syncope at HD with possible hypotension. Will defer any changes in BP meds to nephrologist.

## 2013-01-31 NOTE — Assessment & Plan Note (Signed)
Discussed her plans for renal transplant. Reviewed recent work up from [facility] including perfusion test which was abnormal. Question if she may need cardiac cath prior to transplant. Will send message through to her cardiologist. Continue HD. Follow up 4 weeks.

## 2013-01-31 NOTE — Assessment & Plan Note (Signed)
Lab Results  Component Value Date   HGBA1C 7.1* 11/12/2012   Pt reports recent A1c drawn at dialysis near 6%. Will request records. Continue Levemir.

## 2013-01-31 NOTE — Assessment & Plan Note (Signed)
Symptoms unchanged with addition of Sertraline. Will increase dose to 50mg  daily. Follow up 4 weeks and prn.

## 2013-02-02 ENCOUNTER — Telehealth: Payer: Self-pay | Admitting: *Deleted

## 2013-02-02 NOTE — Telephone Encounter (Signed)
Lmom to call our office. Time to schedule a carotid doppler after 08/12/12.

## 2013-02-03 ENCOUNTER — Ambulatory Visit (INDEPENDENT_AMBULATORY_CARE_PROVIDER_SITE_OTHER): Payer: Self-pay | Admitting: Ophthalmology

## 2013-02-04 ENCOUNTER — Telehealth: Payer: Self-pay

## 2013-02-04 ENCOUNTER — Telehealth: Payer: Self-pay | Admitting: Internal Medicine

## 2013-02-04 NOTE — Telephone Encounter (Signed)
Thanks for reviewing the perfusion study. I will let pt know.

## 2013-02-04 NOTE — Telephone Encounter (Signed)
Notified patient need to come and sign a release from [facility] so we can obtain the stress test in order to discuss further testing per Dr. Donivan Scull request.

## 2013-02-04 NOTE — Telephone Encounter (Signed)
Review of the nuclear stress test performed at [facility] shows small region of mild perfusion defect in the inferoapical and mid inferior wall. No coronary calcification seen on CT scan. GI uptake attenuation artifact noted. Impressions suggests defect is likely from attenuation artifact (though unable to definatively exclude small region of ischemia)  My take on this is that the perfusion defect  is likely from artifact. This would be a low risk stress test given the small size of the defect. Last visit she had no symptoms  Unless the renal transplant team requests clarification with cardiac cath (which could easily be performed), I feel that She would be acceptable risk for renal transplant without further cardiac workup

## 2013-02-04 NOTE — Telephone Encounter (Signed)
Can you let pt know that Dr. Rockey Situ reviewed the perfusion study and he feels abnormalities most likely due to artifact. He does not think she will need cardiac cath prior to renal transplant.

## 2013-02-09 NOTE — Telephone Encounter (Signed)
Patient returned call and verbally agreed understanding.

## 2013-02-09 NOTE — Telephone Encounter (Signed)
Left message to call back  

## 2013-02-16 ENCOUNTER — Ambulatory Visit: Payer: BC Managed Care – PPO | Admitting: Internal Medicine

## 2013-02-23 ENCOUNTER — Ambulatory Visit: Payer: Self-pay | Admitting: Vascular Surgery

## 2013-02-23 ENCOUNTER — Ambulatory Visit (INDEPENDENT_AMBULATORY_CARE_PROVIDER_SITE_OTHER): Payer: Self-pay | Admitting: Ophthalmology

## 2013-02-28 ENCOUNTER — Ambulatory Visit (INDEPENDENT_AMBULATORY_CARE_PROVIDER_SITE_OTHER): Payer: BC Managed Care – PPO | Admitting: Internal Medicine

## 2013-02-28 ENCOUNTER — Encounter: Payer: Self-pay | Admitting: Internal Medicine

## 2013-02-28 VITALS — BP 154/80 | HR 75 | Temp 98.2°F | Wt 181.0 lb

## 2013-02-28 DIAGNOSIS — E1129 Type 2 diabetes mellitus with other diabetic kidney complication: Secondary | ICD-10-CM

## 2013-02-28 DIAGNOSIS — N185 Chronic kidney disease, stage 5: Secondary | ICD-10-CM

## 2013-02-28 DIAGNOSIS — E1121 Type 2 diabetes mellitus with diabetic nephropathy: Secondary | ICD-10-CM

## 2013-02-28 DIAGNOSIS — N058 Unspecified nephritic syndrome with other morphologic changes: Secondary | ICD-10-CM

## 2013-02-28 DIAGNOSIS — R05 Cough: Secondary | ICD-10-CM

## 2013-02-28 DIAGNOSIS — R059 Cough, unspecified: Secondary | ICD-10-CM

## 2013-02-28 MED ORDER — BENZONATATE 100 MG PO CAPS: 100.0000 mg | ORAL_CAPSULE | Freq: Two times a day (BID) | ORAL | Status: DC | PRN

## 2013-02-28 NOTE — Assessment & Plan Note (Signed)
Recent cough after upper respiratory infection 1 week ago. Exam is normal today. Suspect cough from lingering postnasal drip. Will add Tessalon prn. Pt will call if symptoms persistent this week or if any worsening symptoms, fever, chills.

## 2013-02-28 NOTE — Assessment & Plan Note (Signed)
On HD, preparing for renal transplant. S/p recent thrombectomy graft site. Now tolerating HD well. Will request recent labs from [facility]. Follow up 3 months and prn.

## 2013-02-28 NOTE — Assessment & Plan Note (Signed)
Pt reports BG well controlled. Did not bring record of BG with her today. Will request recent A1c which was drawn at Dialysis. Continue Levemir.

## 2013-02-28 NOTE — Progress Notes (Signed)
Subjective:    Patient ID: Theresa [name], female    DOB: 1956-01-28, 57 y.o.   MRN: [id number]  HPI 57 year old female with history of end-stage renal disease on hemodialysis, diabetes, hypertension, hyperlipidemia presents for followup. In regards to end-stage renal disease, she is currently in the process of preparing for transplant. She notes that her dialysis access site became clotted last week and she had to have thrombectomy. She tolerated this well and the site is now functioning. She has to complete dental work before she moves forward with transplant.  In regards to diabetes, she reports blood sugars have been well-controlled. Her A1c was recently checked at dialysis that she is unsure level. She is compliant with medications.  Over the last couple of weeks she has had upper respiratory infection with nasal congestion and cough. She reports that her nephrologist treated her with an oral antibiotic. She has not had any fever, productive cough, chest pain, shortness of breath. However, she continues to have some mild nonproductive cough. She tried taking Mucinex with no improvement.  Outpatient Encounter Prescriptions as of 02/28/2013  Medication Sig Dispense Refill  . carvedilol (COREG) 6.25 MG tablet Take 1 tablet (6.25 mg total) by mouth 2 (two) times daily.  60 tablet  6  . furosemide (LASIX) 40 MG tablet Take 1 tablet (40 mg total) by mouth daily.  30 tablet  6  . gabapentin (NEURONTIN) 800 MG tablet Take 1 tablet (800 mg total) by mouth 3 (three) times daily.  90 tablet  6  . gatifloxacin (ZYMAXID) 0.5 % SOLN Place 1 drop into the left eye 4 (four) times daily.      . insulin detemir (LEVEMIR) 100 UNIT/ML injection Inject 0.2 mLs (20 Units total) into the skin daily.  10 mL  3  . Insulin Pen Needle 32G X 6 MM MISC 1 Units by Does not apply route as needed.  90 each  3  . losartan (COZAAR) 25 MG tablet Take 4 tablets (100 mg total) by mouth daily.  30 tablet  6  . prednisoLONE acetate  (PRED FORTE) 1 % ophthalmic suspension Place 1 drop into the left eye 4 (four) times daily.  5 mL    . rosuvastatin (CRESTOR) 40 MG tablet Take 1 tablet (40 mg total) by mouth daily.  30 tablet  6  . sertraline (ZOLOFT) 50 MG tablet Take 1 tablet (50 mg total) by mouth daily.  30 tablet  3  . benzonatate (TESSALON) 100 MG capsule Take 1 capsule (100 mg total) by mouth 2 (two) times daily as needed for cough.  20 capsule  0   No facility-administered encounter medications on file as of 02/28/2013.   BP 154/80  Pulse 75  Temp(Src) 98.2 F (36.8 C) (Oral)  Wt 181 lb (82.101 kg)  BMI 27.91 kg/m2  SpO2 98%  Review of Systems  Constitutional: Negative for fever, chills, appetite change, fatigue and unexpected weight change.  HENT: Negative for ear pain, congestion, sore throat, trouble swallowing, neck pain, voice change and sinus pressure.   Eyes: Negative for visual disturbance.  Respiratory: Positive for cough. Negative for shortness of breath, wheezing and stridor.   Cardiovascular: Negative for chest pain, palpitations and leg swelling.  Gastrointestinal: Negative for nausea, vomiting, abdominal pain, diarrhea, constipation, blood in stool, abdominal distention and anal bleeding.  Genitourinary: Negative for dysuria and flank pain.  Musculoskeletal: Positive for myalgias. Negative for arthralgias and gait problem.  Skin: Negative for color change and rash.  Neurological:  Negative for dizziness and headaches.  Hematological: Negative for adenopathy. Does not bruise/bleed easily.  Psychiatric/Behavioral: Negative for suicidal ideas, sleep disturbance and dysphoric mood. The patient is not nervous/anxious.        Objective:   Physical Exam  Constitutional: She is oriented to person, place, and time. She appears well-developed and well-nourished. No distress.  HENT:  Head: Normocephalic and atraumatic.  Right Ear: External ear normal.  Left Ear: External ear normal.  Nose: Nose  normal.  Mouth/Throat: Oropharynx is clear and moist. No oropharyngeal exudate.  Eyes: Conjunctivae are normal. Pupils are equal, round, and reactive to light. Right eye exhibits no discharge. Left eye exhibits no discharge. No scleral icterus.  Neck: Normal range of motion. Neck supple. No tracheal deviation present. No thyromegaly present.  Cardiovascular: Normal rate, regular rhythm, normal heart sounds and intact distal pulses.  Exam reveals no gallop and no friction rub.   No murmur heard. Pulmonary/Chest: Effort normal and breath sounds normal. No accessory muscle usage. Not tachypneic. No respiratory distress. She has no decreased breath sounds. She has no wheezes. She has no rhonchi. She has no rales. She exhibits no tenderness.  Musculoskeletal: Normal range of motion. She exhibits no edema and no tenderness.  Lymphadenopathy:    She has no cervical adenopathy.  Neurological: She is alert and oriented to person, place, and time. No cranial nerve deficit. She exhibits normal muscle tone. Coordination normal.  Skin: Skin is warm and dry. No rash noted. She is not diaphoretic. No erythema. No pallor.  Psychiatric: She has a normal mood and affect. Her behavior is normal. Judgment and thought content normal.          Assessment & Plan:

## 2013-03-02 ENCOUNTER — Encounter (INDEPENDENT_AMBULATORY_CARE_PROVIDER_SITE_OTHER): Payer: BC Managed Care – PPO

## 2013-03-02 DIAGNOSIS — I6529 Occlusion and stenosis of unspecified carotid artery: Secondary | ICD-10-CM

## 2013-03-28 ENCOUNTER — Encounter: Payer: Self-pay | Admitting: Adult Health

## 2013-03-28 ENCOUNTER — Telehealth: Payer: Self-pay | Admitting: Internal Medicine

## 2013-03-28 ENCOUNTER — Ambulatory Visit (INDEPENDENT_AMBULATORY_CARE_PROVIDER_SITE_OTHER): Payer: BC Managed Care – PPO | Admitting: Adult Health

## 2013-03-28 ENCOUNTER — Other Ambulatory Visit: Payer: Self-pay | Admitting: Adult Health

## 2013-03-28 VITALS — BP 156/68 | HR 84 | Temp 98.7°F | Resp 12 | Wt 167.0 lb

## 2013-03-28 DIAGNOSIS — J329 Chronic sinusitis, unspecified: Secondary | ICD-10-CM

## 2013-03-28 DIAGNOSIS — H109 Unspecified conjunctivitis: Secondary | ICD-10-CM

## 2013-03-28 DIAGNOSIS — R197 Diarrhea, unspecified: Secondary | ICD-10-CM

## 2013-03-28 DIAGNOSIS — R05 Cough: Secondary | ICD-10-CM

## 2013-03-28 DIAGNOSIS — R059 Cough, unspecified: Secondary | ICD-10-CM

## 2013-03-28 LAB — BASIC METABOLIC PANEL WITH GFR
BUN: 34 mg/dL — ABNORMAL HIGH (ref 6–23)
CO2: 28 meq/L (ref 19–32)
Calcium: 8.5 mg/dL (ref 8.4–10.5)
Chloride: 97 meq/L (ref 96–112)
Creatinine, Ser: 9.5 mg/dL (ref 0.4–1.2)
GFR: 5.48 mL/min — CL
Glucose, Bld: 68 mg/dL — ABNORMAL LOW (ref 70–99)
Potassium: 3.8 meq/L (ref 3.5–5.1)
Sodium: 137 meq/L (ref 135–145)

## 2013-03-28 MED ORDER — AMOXICILLIN-POT CLAVULANATE 875-125 MG PO TABS: 1.0000 | ORAL_TABLET | Freq: Two times a day (BID) | ORAL | Status: DC

## 2013-03-28 MED ORDER — GUAIFENESIN-CODEINE 100-10 MG/5ML PO SOLN: 5.0000 mL | Freq: Three times a day (TID) | ORAL | Status: DC | PRN

## 2013-03-28 MED ORDER — POLYMYXIN B-TRIMETHOPRIM 10000-0.1 UNIT/ML-% OP SOLN: 1.0000 [drp] | OPHTHALMIC | Status: DC

## 2013-03-28 MED ORDER — TOBRAMYCIN 0.3 % OP SOLN: 1.0000 [drp] | Freq: Four times a day (QID) | OPHTHALMIC | Status: DC

## 2013-03-28 NOTE — Patient Instructions (Signed)
  Start Augmentin twice a day for 10 days. This is an antibiotic.  Take Robitussin AC for severe cough. This medication has codeine and can make you sleepy. It also has similarities to hydrocodone which you have reported getting itchy from. Report any adverse effects. You may not encounter any.  For your eyes, please use tobramycin eye drops into both eyes 4 times a day for 7 days.  For your diarrhea, try imodium. If symptoms do not improve within 3-4 days, please let us know.  Please have your blood work done prior to leaving the office. We will contact you once the results are available.

## 2013-03-28 NOTE — Telephone Encounter (Signed)
Pt was given rx for eyedrop at appt this morning.  States [facility] on [address] and [address] do not have it.  Told pt they could get it on Wednesday.  Is there another eye drop she could try that the [facility] will have on hand?  Pt states she was able to get all of her other prescriptions.

## 2013-03-28 NOTE — Progress Notes (Signed)
Subjective:    Patient ID: Theresa [name], female    DOB: [birth year], 57 y.o.   MRN: [id number]  HPI  Patient is a 57 year old female who presents to clinic with complaints of an ongoing cough for greater than 2-3 weeks. She has taken [employer] for the cough however this has not helped. Cough is so severe at times that it causes vomiting. She also reports right ear pain which started Saturday. She reports a greenish drainage from her sinuses. She has been experiencing diarrhea since Saturday. She has not taken any over-the-counter medications. She reports that she is not able to sleep secondary to the severe coughing. She reports allergy to hydrocodone and reports that this causes itching. She denies fever, chills or shortness of breath. She denies any travel outside of the [place] or any contact with persons who have traveled outside of the [place].   Current Outpatient Prescriptions on File Prior to Visit  Medication Sig Dispense Refill  . benzonatate (TESSALON) 100 MG capsule Take 1 capsule (100 mg total) by mouth 2 (two) times daily as needed for cough.  20 capsule  0  . carvedilol (COREG) 6.25 MG tablet Take 1 tablet (6.25 mg total) by mouth 2 (two) times daily.  60 tablet  6  . furosemide (LASIX) 40 MG tablet Take 1 tablet (40 mg total) by mouth daily.  30 tablet  6  . gatifloxacin (ZYMAXID) 0.5 % SOLN Place 1 drop into the left eye 4 (four) times daily.      . insulin detemir (LEVEMIR) 100 UNIT/ML injection Inject 0.2 mLs (20 Units total) into the skin daily.  10 mL  3  . Insulin Pen Needle 32G X 6 MM MISC 1 Units by Does not apply route as needed.  90 each  3  . losartan (COZAAR) 25 MG tablet Take 4 tablets (100 mg total) by mouth daily.  30 tablet  6  . prednisoLONE acetate (PRED FORTE) 1 % ophthalmic suspension Place 1 drop into the left eye 4 (four) times daily.  5 mL    . rosuvastatin (CRESTOR) 40 MG tablet Take 1 tablet (40 mg total) by mouth daily.  30 tablet  6  . sertraline (ZOLOFT) 50  MG tablet Take 1 tablet (50 mg total) by mouth daily.  30 tablet  3   No current facility-administered medications on file prior to visit.    Review of Systems  Constitutional: Negative for fever and chills.  HENT: Positive for ear pain, postnasal drip and sinus pressure. Negative for sore throat.   Eyes: Positive for discharge.  Respiratory: Positive for cough. Negative for shortness of breath and wheezing.   Cardiovascular: Negative.   Gastrointestinal: Positive for vomiting and diarrhea.       Objective:   Physical Exam  Constitutional: She is oriented to person, place, and time.  Overweight, pleasant 57 year old female appearing acutely ill  HENT:  Bilateral ear canals are erythematous. Pharyngeal erythema with posterior drainage noted. No exudate.  Neck: No tracheal deviation present.  Cardiovascular: Normal rate and regular rhythm.  Exam reveals no gallop and no friction rub.   Murmur heard. Systolic murmur  Pulmonary/Chest: Effort normal and breath sounds normal. No respiratory distress. She has no wheezes. She has no rales.  Abdominal: Soft. Bowel sounds are normal.  Musculoskeletal: Normal range of motion.  Lymphadenopathy:    She has no cervical adenopathy.  Neurological: She is alert and oriented to person, place, and time.  Skin: Skin is warm and  dry.  Psychiatric: She has a normal mood and affect. Her behavior is normal. Judgment and thought content normal.          Assessment & Plan:

## 2013-03-28 NOTE — Assessment & Plan Note (Signed)
Symptoms ongoing greater than 2 weeks and now with greenish drainage. Start Augmentin twice a day x10 days.

## 2013-03-28 NOTE — Assessment & Plan Note (Signed)
Suspect this may be related to swallowing drainage from sinuses. Try Imodium. Will check CBC and basic metabolic panel.

## 2013-03-28 NOTE — Assessment & Plan Note (Signed)
Suspect this may be secondary to postnasal drip. Start Robitussin-AC. Discussed that medication contains codeine; although not the same as hydrocodone, they are related and she may experience the same side effect of itching. This may also cause sedation.

## 2013-03-28 NOTE — Assessment & Plan Note (Signed)
Tobramycin ophthalmic solution 2 drops into both eyes 4 times a day x7 days.

## 2013-03-28 NOTE — Telephone Encounter (Signed)
Fwd to Raquel, patient was seen by Raquel today.

## 2013-03-29 NOTE — Progress Notes (Signed)
Patient aware.

## 2013-03-30 NOTE — Telephone Encounter (Signed)
Spoke with patient yesterday and she aware another prescription was sent to the pharmacy

## 2013-04-14 ENCOUNTER — Telehealth: Payer: Self-pay

## 2013-04-14 NOTE — Telephone Encounter (Signed)
Spoke w/ pt.  Sched to see Dr. Rockey Situ 04/20/13 @ 10:30.

## 2013-04-14 NOTE — Telephone Encounter (Signed)
Left message for pt to call back regarding long-term disability paperwork we received from [employer].  Pt has not been seen in our office since 12/2011.   Theresa [name] is requesting notes since 06/2012.  Pt needs an appt to see Dr. Rockey Situ in order to complete her forms.

## 2013-04-19 ENCOUNTER — Telehealth: Payer: Self-pay | Admitting: *Deleted

## 2013-04-19 NOTE — Telephone Encounter (Signed)
Pharmacy Note:  Levemir Flexpen have been  Changes to Flex Touch - can we get a new rx?

## 2013-04-20 ENCOUNTER — Ambulatory Visit (INDEPENDENT_AMBULATORY_CARE_PROVIDER_SITE_OTHER): Payer: BC Managed Care – PPO | Admitting: Cardiovascular Disease

## 2013-04-20 ENCOUNTER — Ambulatory Visit: Payer: BC Managed Care – PPO | Admitting: Adult Health

## 2013-04-20 ENCOUNTER — Encounter: Payer: Self-pay | Admitting: Cardiovascular Disease

## 2013-04-20 VITALS — BP 158/68 | HR 78 | Ht 67.5 in | Wt 177.5 lb

## 2013-04-20 DIAGNOSIS — I152 Hypertension secondary to endocrine disorders: Secondary | ICD-10-CM

## 2013-04-20 DIAGNOSIS — R55 Syncope and collapse: Secondary | ICD-10-CM

## 2013-04-20 DIAGNOSIS — R011 Cardiac murmur, unspecified: Secondary | ICD-10-CM

## 2013-04-20 DIAGNOSIS — I10 Essential (primary) hypertension: Secondary | ICD-10-CM

## 2013-04-20 DIAGNOSIS — E785 Hyperlipidemia, unspecified: Secondary | ICD-10-CM

## 2013-04-20 DIAGNOSIS — N185 Chronic kidney disease, stage 5: Secondary | ICD-10-CM

## 2013-04-20 DIAGNOSIS — E1159 Type 2 diabetes mellitus with other circulatory complications: Secondary | ICD-10-CM

## 2013-04-20 DIAGNOSIS — R9439 Abnormal result of other cardiovascular function study: Secondary | ICD-10-CM

## 2013-04-20 DIAGNOSIS — E1169 Type 2 diabetes mellitus with other specified complication: Secondary | ICD-10-CM

## 2013-04-20 DIAGNOSIS — I519 Heart disease, unspecified: Secondary | ICD-10-CM

## 2013-04-20 MED ORDER — INSULIN DETEMIR 100 UNIT/ML FLEXPEN: 20.0000 [IU] | PEN_INJECTOR | Freq: Every day | SUBCUTANEOUS | Status: DC

## 2013-04-20 NOTE — Telephone Encounter (Signed)
Prescription send to pharmacy.

## 2013-04-20 NOTE — Patient Instructions (Signed)
You are doing well. No medication changes were made.  Please call us if you have new issues that need to be addressed before your next appt.  Your physician wants you to follow-up in: 6 months.  You will receive a reminder letter in the mail two months in advance. If you don't receive a letter, please call our office to schedule the follow-up appointment.   

## 2013-04-20 NOTE — Assessment & Plan Note (Signed)
Currently on hemodialysis. Managed by [facility]

## 2013-04-20 NOTE — Assessment & Plan Note (Signed)
Previous systolic dysfunction and mitral regurgitation has resolved by the echocardiogram July 2014 at [facility], per the notes. Now with normal LV systolic function, mild MR. Improvement likely from regular hemodialysis

## 2013-04-20 NOTE — Assessment & Plan Note (Signed)
Blood pressure mildly elevated today. No medication changes made. We have suggested she closely monitor her blood pressure at dialysis. If blood pressure continues to run high, amlodipine could be added

## 2013-04-20 NOTE — Assessment & Plan Note (Signed)
She is high risk for CAD given diabetes, end-stage renal disease. She is tolerating Crestor 40 mg daily. She is uncertain if she can afford co-pays in January 2013. Currently she has met her to Dr. Eddie Dibbles. We have suggested she call us if medication is too expensive, we could switch to Lipitor 80 mg daily

## 2013-04-20 NOTE — Assessment & Plan Note (Signed)
Essentially normal stress test with normal ejection fraction, attenuation artifact mentioned. Currently with no symptoms. No further testing should be needed prior to transplant unless she has anginal symptoms.

## 2013-04-20 NOTE — Progress Notes (Signed)
Patient ID: Theresa [name], female    DOB: 01/19/1956, 57 y.o.   MRN: [id number]  HPI Comments: Theresa [name] is a pleasant 57 year old woman with 25 years of smoking, diabetes, chronic renal insufficiency,  previously noted to have moderate to severe mitral valve regurgitation on prior echocardiogram (improved to mild MR on recent echo), pneumonia in June of 2012 with admission overnight to [facility] who initially presented by referral from Dr. Candiss Norse (renal) for evaluation of her ejection fraction which is 45% and mitral valve disease.   She had worsening renal dysfunction over the past year, now on dialysis since February 2014, 3 days per week. She's had no problems with heart failure, continues to take Lasix daily. Hemoglobin A1c improved from 9 down to 7.1, total cholesterol 206 Most recent creatinine of 9 She does report having a recent sinus infection, recently completed antibiotics. Still with mild residual cough  She has been evaluated for renal transplant at [facility]. Stress test at [facility] August 2014 was essentially normal with normal ejection fraction (there was mention of very small mild region of artifact) Echocardiogram at [facility] July 2014 showed ejection fraction greater than 55%, mild MR  She reports that currently she feels well. She sees Dr. Candiss Norse  of [facility] .She denies any shortness of breath, chest pain, lightheadedness or dizziness. She recently had revision done to her AV graft on 02/23/2013. Graft was thrombosed. She mentioned that there is a stenosis as well need revision with ballooning  Echocardiogram on November 10, 2010 shows mildly dilated left ventricle, ejection fraction 45-50%, mild MS, moderate to severe mitral regurg, mild to moderate tricuspid regurg.   EKG shows normal sinus rhythm with rate 78 beats per minute with nonspecific T wave abnormality in 1 and aVL   Outpatient Encounter Prescriptions as of 04/20/2013  Medication Sig  . acetaminophen-codeine (TYLENOL #3) 300-30  MG per tablet Take 1 tablet by mouth every 4 (four) hours as needed.   Marland Kitchen albuterol (PROAIR HFA) 108 (90 BASE) MCG/ACT inhaler Inhale 1 puff into the lungs every 4 (four) hours as needed.   . carvedilol (COREG) 6.25 MG tablet Take 1 tablet (6.25 mg total) by mouth 2 (two) times daily.  . furosemide (LASIX) 40 MG tablet Take 1 tablet (40 mg total) by mouth daily.  . Insulin Detemir 100 UNIT/ML SOPN Inject 20 Units into the skin daily.  . Insulin Pen Needle 32G X 6 MM MISC 1 Units by Does not apply route as needed.  Marland Kitchen losartan (COZAAR) 25 MG tablet Take 4 tablets (100 mg total) by mouth daily.  . mometasone (NASONEX) 50 MCG/ACT nasal spray Place 2 sprays into the nose daily. 2 sprays by Each Nare route daily as needed.  . pregabalin (LYRICA) 50 MG capsule Take 50 mg by mouth daily.  . rosuvastatin (CRESTOR) 40 MG tablet Take 1 tablet (40 mg total) by mouth daily.  . sertraline (ZOLOFT) 50 MG tablet Take 1 tablet (50 mg total) by mouth daily.  Marland Kitchen trimethoprim-polymyxin b (POLYTRIM) ophthalmic solution Place 1 drop into both eyes every 4 (four) hours.   Review of Systems  Constitutional: Negative.   HENT: Negative.   Eyes: Negative.   Respiratory: Negative.   Cardiovascular: Negative.   Gastrointestinal: Negative.   Endocrine: Negative.   Musculoskeletal: Negative.   Skin: Negative.   Allergic/Immunologic: Negative.   Neurological: Negative.   Hematological: Negative.   Psychiatric/Behavioral: Negative.   All other systems reviewed and are negative.   BP 158/68  Pulse  78  Ht 5' 7.5" (1.715 m)  Wt 177 lb 8 oz (80.513 kg)  BMI 27.37 kg/m2  Physical Exam  Nursing note and vitals reviewed. Constitutional: She is oriented to person, place, and time. She appears well-developed and well-nourished.  AV graft on her left arm  HENT:  Head: Normocephalic.  Nose: Nose normal.  Mouth/Throat: Oropharynx is clear and moist.  Eyes: Conjunctivae are normal. Pupils are equal, round, and reactive  to light.  Neck: Normal range of motion. Neck supple. No JVD present. Carotid bruit is present.  Cardiovascular: Normal rate, regular rhythm, S1 normal, S2 normal and intact distal pulses.  Exam reveals no gallop and no friction rub.   Murmur heard.  Crescendo systolic murmur is present with a grade of 2/6  Pulmonary/Chest: Effort normal and breath sounds normal. No respiratory distress. She has no wheezes. She has no rales. She exhibits no tenderness.  Abdominal: Soft. Bowel sounds are normal. She exhibits no distension. There is no tenderness.  Musculoskeletal: Normal range of motion. She exhibits no edema and no tenderness.  Lymphadenopathy:    She has no cervical adenopathy.  Neurological: She is alert and oriented to person, place, and time. Coordination normal.  Skin: Skin is warm and dry. No rash noted. No erythema.  Psychiatric: She has a normal mood and affect. Her behavior is normal. Judgment and thought content normal.    Assessment and Plan

## 2013-04-21 ENCOUNTER — Ambulatory Visit: Payer: BC Managed Care – PPO | Admitting: Internal Medicine

## 2013-04-29 ENCOUNTER — Telehealth: Payer: Self-pay

## 2013-04-29 NOTE — Telephone Encounter (Signed)
Request from [place] , sent to [facility] on 04/29/2013.

## 2013-05-04 ENCOUNTER — Ambulatory Visit: Payer: Self-pay | Admitting: Vascular Surgery

## 2013-05-09 ENCOUNTER — Encounter (INDEPENDENT_AMBULATORY_CARE_PROVIDER_SITE_OTHER): Payer: BC Managed Care – PPO | Admitting: Ophthalmology

## 2013-05-09 DIAGNOSIS — E11359 Type 2 diabetes mellitus with proliferative diabetic retinopathy without macular edema: Secondary | ICD-10-CM

## 2013-05-09 DIAGNOSIS — H43819 Vitreous degeneration, unspecified eye: Secondary | ICD-10-CM

## 2013-05-09 DIAGNOSIS — I219 Acute myocardial infarction, unspecified: Secondary | ICD-10-CM

## 2013-05-09 DIAGNOSIS — H2510 Age-related nuclear cataract, unspecified eye: Secondary | ICD-10-CM

## 2013-05-09 DIAGNOSIS — I10 Essential (primary) hypertension: Secondary | ICD-10-CM

## 2013-05-09 DIAGNOSIS — E1139 Type 2 diabetes mellitus with other diabetic ophthalmic complication: Secondary | ICD-10-CM

## 2013-05-09 DIAGNOSIS — H26499 Other secondary cataract, unspecified eye: Secondary | ICD-10-CM

## 2013-05-09 DIAGNOSIS — H35039 Hypertensive retinopathy, unspecified eye: Secondary | ICD-10-CM

## 2013-05-09 HISTORY — DX: Acute myocardial infarction, unspecified: I21.9

## 2013-05-12 LAB — CBC
HGB: 9.6 g/dL — ABNORMAL LOW (ref 12.0–16.0)
MCHC: 33.3 g/dL (ref 32.0–36.0)
Platelet: 203 10*3/uL (ref 150–440)
RBC: 2.9 10*6/uL — ABNORMAL LOW (ref 3.80–5.20)
RDW: 14.9 % — ABNORMAL HIGH (ref 11.5–14.5)

## 2013-05-12 LAB — BASIC METABOLIC PANEL
Anion Gap: 8 (ref 7–16)
BUN: 13 mg/dL (ref 7–18)
Creatinine: 5.59 mg/dL — ABNORMAL HIGH (ref 0.60–1.30)
EGFR (African American): 9 — ABNORMAL LOW
EGFR (Non-African Amer.): 8 — ABNORMAL LOW

## 2013-05-12 LAB — PRO B NATRIURETIC PEPTIDE: B-Type Natriuretic Peptide: 21994 pg/mL — ABNORMAL HIGH (ref 0–125)

## 2013-05-12 LAB — TROPONIN I: Troponin-I: 0.03 ng/mL

## 2013-05-12 LAB — CK TOTAL AND CKMB (NOT AT ARMC): CK-MB: 1.2 ng/mL (ref 0.5–3.6)

## 2013-05-13 ENCOUNTER — Inpatient Hospital Stay: Payer: Self-pay | Admitting: Family Medicine

## 2013-05-13 LAB — PROTIME-INR
INR: 1
Prothrombin Time: 13.4 secs (ref 11.5–14.7)

## 2013-05-13 LAB — TROPONIN I: Troponin-I: 0.02 ng/mL

## 2013-05-14 LAB — BASIC METABOLIC PANEL
Anion Gap: 8 (ref 7–16)
Calcium, Total: 8.2 mg/dL — ABNORMAL LOW (ref 8.5–10.1)
Chloride: 96 mmol/L — ABNORMAL LOW (ref 98–107)
Co2: 27 mmol/L (ref 21–32)
Glucose: 343 mg/dL — ABNORMAL HIGH (ref 65–99)
Potassium: 4.5 mmol/L (ref 3.5–5.1)
Sodium: 131 mmol/L — ABNORMAL LOW (ref 136–145)

## 2013-05-14 LAB — CBC WITH DIFFERENTIAL/PLATELET
Basophil #: 0 10*3/uL (ref 0.0–0.1)
Basophil %: 0.1 %
Eosinophil #: 0 10*3/uL (ref 0.0–0.7)
HCT: 26.2 % — ABNORMAL LOW (ref 35.0–47.0)
HGB: 8.6 g/dL — ABNORMAL LOW (ref 12.0–16.0)
Lymphocyte #: 0.6 10*3/uL — ABNORMAL LOW (ref 1.0–3.6)
Lymphocyte %: 3.6 %
MCHC: 32.8 g/dL (ref 32.0–36.0)
MCV: 99 fL (ref 80–100)
Monocyte #: 0.2 x10 3/mm (ref 0.2–0.9)
Neutrophil #: 15.5 10*3/uL — ABNORMAL HIGH (ref 1.4–6.5)
RBC: 2.65 10*6/uL — ABNORMAL LOW (ref 3.80–5.20)

## 2013-05-14 LAB — PHOSPHORUS: Phosphorus: 5.9 mg/dL — ABNORMAL HIGH (ref 2.5–4.9)

## 2013-05-15 LAB — BASIC METABOLIC PANEL
Anion Gap: 3 — ABNORMAL LOW (ref 7–16)
Calcium, Total: 8.5 mg/dL (ref 8.5–10.1)
Chloride: 98 mmol/L (ref 98–107)
Creatinine: 5.13 mg/dL — ABNORMAL HIGH (ref 0.60–1.30)
EGFR (Non-African Amer.): 9 — ABNORMAL LOW
Glucose: 135 mg/dL — ABNORMAL HIGH (ref 65–99)
Osmolality: 277 (ref 275–301)
Potassium: 4.2 mmol/L (ref 3.5–5.1)

## 2013-05-15 LAB — CBC WITH DIFFERENTIAL/PLATELET
Eosinophil %: 0 %
HCT: 27.4 % — ABNORMAL LOW (ref 35.0–47.0)
Lymphocyte %: 7 %
MCH: 33.1 pg (ref 26.0–34.0)
MCHC: 33.6 g/dL (ref 32.0–36.0)
MCV: 99 fL (ref 80–100)
Monocyte %: 2.9 %
Neutrophil %: 90 %
Platelet: 227 10*3/uL (ref 150–440)
RBC: 2.77 10*6/uL — ABNORMAL LOW (ref 3.80–5.20)
RDW: 15.2 % — ABNORMAL HIGH (ref 11.5–14.5)

## 2013-05-16 LAB — CBC WITH DIFFERENTIAL/PLATELET
Basophil #: 0 10*3/uL (ref 0.0–0.1)
Basophil %: 0.1 %
Eosinophil #: 0 10*3/uL (ref 0.0–0.7)
Eosinophil %: 0 %
HCT: 26.6 % — ABNORMAL LOW (ref 35.0–47.0)
HGB: 8.8 g/dL — ABNORMAL LOW (ref 12.0–16.0)
Lymphocyte %: 5.6 %
MCV: 100 fL (ref 80–100)
Monocyte #: 0.6 x10 3/mm (ref 0.2–0.9)
Monocyte %: 3.1 %
RBC: 2.66 10*6/uL — ABNORMAL LOW (ref 3.80–5.20)
RDW: 15.1 % — ABNORMAL HIGH (ref 11.5–14.5)

## 2013-05-16 LAB — EXPECTORATED SPUTUM ASSESSMENT W GRAM STAIN, RFLX TO RESP C

## 2013-05-17 ENCOUNTER — Telehealth: Payer: Self-pay | Admitting: Internal Medicine

## 2013-05-17 LAB — PHOSPHORUS: Phosphorus: 5.1 mg/dL — ABNORMAL HIGH (ref 2.5–4.9)

## 2013-05-17 NOTE — Telephone Encounter (Signed)
Hospital follow up 12.15.14. I asked them to send notes.

## 2013-05-17 NOTE — Telephone Encounter (Signed)
She already had a regular follow up appointment scheduled for this day. It is a 15 minute slot however the next 30 minute opening was no time soon.

## 2013-05-18 LAB — CULTURE, BLOOD (SINGLE)

## 2013-05-20 ENCOUNTER — Encounter (INDEPENDENT_AMBULATORY_CARE_PROVIDER_SITE_OTHER): Payer: BC Managed Care – PPO | Admitting: Ophthalmology

## 2013-05-23 ENCOUNTER — Ambulatory Visit: Payer: Medicare Other | Admitting: Internal Medicine

## 2013-05-25 ENCOUNTER — Encounter: Payer: Self-pay | Admitting: Internal Medicine

## 2013-05-27 ENCOUNTER — Encounter (INDEPENDENT_AMBULATORY_CARE_PROVIDER_SITE_OTHER): Payer: BC Managed Care – PPO | Admitting: Ophthalmology

## 2013-05-28 ENCOUNTER — Inpatient Hospital Stay: Payer: Self-pay | Admitting: Student

## 2013-05-28 ENCOUNTER — Inpatient Hospital Stay (HOSPITAL_COMMUNITY)
Admission: EM | Admit: 2013-05-28 | Discharge: 2013-06-03 | DRG: 246 | Disposition: A | Discharge status: Home / Self Care | Payer: BC Managed Care – PPO | Attending: Cardiovascular Disease | Admitting: Cardiovascular Disease

## 2013-05-28 ENCOUNTER — Inpatient Hospital Stay (HOSPITAL_COMMUNITY): Payer: BC Managed Care – PPO

## 2013-05-28 ENCOUNTER — Encounter (HOSPITAL_COMMUNITY): Payer: Self-pay | Admitting: Unknown Physician Specialty

## 2013-05-28 ENCOUNTER — Encounter (HOSPITAL_COMMUNITY)
Admission: EM | Disposition: A | Discharge status: Home / Self Care | Payer: Self-pay | Source: Home / Self Care | Attending: Cardiovascular Disease

## 2013-05-28 DIAGNOSIS — J449 Chronic obstructive pulmonary disease, unspecified: Secondary | ICD-10-CM

## 2013-05-28 DIAGNOSIS — R918 Other nonspecific abnormal finding of lung field: Secondary | ICD-10-CM

## 2013-05-28 DIAGNOSIS — I509 Heart failure, unspecified: Secondary | ICD-10-CM

## 2013-05-28 DIAGNOSIS — Z8249 Family history of ischemic heart disease and other diseases of the circulatory system: Secondary | ICD-10-CM

## 2013-05-28 DIAGNOSIS — N189 Chronic kidney disease, unspecified: Secondary | ICD-10-CM

## 2013-05-28 DIAGNOSIS — Z992 Dependence on renal dialysis: Secondary | ICD-10-CM

## 2013-05-28 DIAGNOSIS — I10 Essential (primary) hypertension: Secondary | ICD-10-CM

## 2013-05-28 DIAGNOSIS — Z800 Family history of malignant neoplasm of digestive organs: Secondary | ICD-10-CM

## 2013-05-28 DIAGNOSIS — D631 Anemia in chronic kidney disease: Secondary | ICD-10-CM

## 2013-05-28 DIAGNOSIS — I214 Non-ST elevation (NSTEMI) myocardial infarction: Secondary | ICD-10-CM

## 2013-05-28 DIAGNOSIS — E1169 Type 2 diabetes mellitus with other specified complication: Secondary | ICD-10-CM | POA: Diagnosis not present

## 2013-05-28 DIAGNOSIS — J189 Pneumonia, unspecified organism: Secondary | ICD-10-CM

## 2013-05-28 DIAGNOSIS — E1142 Type 2 diabetes mellitus with diabetic polyneuropathy: Secondary | ICD-10-CM | POA: Diagnosis present

## 2013-05-28 DIAGNOSIS — N186 End stage renal disease: Secondary | ICD-10-CM

## 2013-05-28 DIAGNOSIS — Z794 Long term (current) use of insulin: Secondary | ICD-10-CM

## 2013-05-28 DIAGNOSIS — N185 Chronic kidney disease, stage 5: Secondary | ICD-10-CM

## 2013-05-28 DIAGNOSIS — J4489 Other specified chronic obstructive pulmonary disease: Secondary | ICD-10-CM

## 2013-05-28 DIAGNOSIS — E11319 Type 2 diabetes mellitus with unspecified diabetic retinopathy without macular edema: Secondary | ICD-10-CM | POA: Diagnosis present

## 2013-05-28 DIAGNOSIS — E1149 Type 2 diabetes mellitus with other diabetic neurological complication: Secondary | ICD-10-CM | POA: Diagnosis present

## 2013-05-28 DIAGNOSIS — I059 Rheumatic mitral valve disease, unspecified: Secondary | ICD-10-CM | POA: Diagnosis present

## 2013-05-28 DIAGNOSIS — Z7982 Long term (current) use of aspirin: Secondary | ICD-10-CM

## 2013-05-28 DIAGNOSIS — I2119 ST elevation (STEMI) myocardial infarction involving other coronary artery of inferior wall: Principal | ICD-10-CM

## 2013-05-28 DIAGNOSIS — E1140 Type 2 diabetes mellitus with diabetic neuropathy, unspecified: Secondary | ICD-10-CM | POA: Diagnosis present

## 2013-05-28 DIAGNOSIS — Z9861 Coronary angioplasty status: Secondary | ICD-10-CM

## 2013-05-28 DIAGNOSIS — D573 Sickle-cell trait: Secondary | ICD-10-CM | POA: Diagnosis present

## 2013-05-28 DIAGNOSIS — E119 Type 2 diabetes mellitus without complications: Secondary | ICD-10-CM

## 2013-05-28 DIAGNOSIS — I5031 Acute diastolic (congestive) heart failure: Secondary | ICD-10-CM | POA: Diagnosis not present

## 2013-05-28 DIAGNOSIS — I953 Hypotension of hemodialysis: Secondary | ICD-10-CM | POA: Diagnosis not present

## 2013-05-28 DIAGNOSIS — J9600 Acute respiratory failure, unspecified whether with hypoxia or hypercapnia: Secondary | ICD-10-CM

## 2013-05-28 DIAGNOSIS — J438 Other emphysema: Secondary | ICD-10-CM | POA: Diagnosis present

## 2013-05-28 DIAGNOSIS — Z833 Family history of diabetes mellitus: Secondary | ICD-10-CM

## 2013-05-28 DIAGNOSIS — I739 Peripheral vascular disease, unspecified: Secondary | ICD-10-CM | POA: Diagnosis present

## 2013-05-28 DIAGNOSIS — E1139 Type 2 diabetes mellitus with other diabetic ophthalmic complication: Secondary | ICD-10-CM | POA: Diagnosis present

## 2013-05-28 DIAGNOSIS — R079 Chest pain, unspecified: Secondary | ICD-10-CM

## 2013-05-28 DIAGNOSIS — E785 Hyperlipidemia, unspecified: Secondary | ICD-10-CM

## 2013-05-28 DIAGNOSIS — Z955 Presence of coronary angioplasty implant and graft: Secondary | ICD-10-CM

## 2013-05-28 DIAGNOSIS — Z823 Family history of stroke: Secondary | ICD-10-CM

## 2013-05-28 DIAGNOSIS — I2510 Atherosclerotic heart disease of native coronary artery without angina pectoris: Secondary | ICD-10-CM | POA: Diagnosis present

## 2013-05-28 DIAGNOSIS — Z803 Family history of malignant neoplasm of breast: Secondary | ICD-10-CM

## 2013-05-28 DIAGNOSIS — Z79899 Other long term (current) drug therapy: Secondary | ICD-10-CM

## 2013-05-28 DIAGNOSIS — Z87891 Personal history of nicotine dependence: Secondary | ICD-10-CM

## 2013-05-28 DIAGNOSIS — I120 Hypertensive chronic kidney disease with stage 5 chronic kidney disease or end stage renal disease: Secondary | ICD-10-CM | POA: Diagnosis present

## 2013-05-28 HISTORY — DX: Type 2 diabetes mellitus with unspecified diabetic retinopathy without macular edema: E11.319

## 2013-05-28 HISTORY — DX: Dependence on renal dialysis: N18.6

## 2013-05-28 HISTORY — PX: LEFT HEART CATHETERIZATION WITH CORONARY ANGIOGRAM: SHX5451

## 2013-05-28 HISTORY — DX: End stage renal disease: Z99.2

## 2013-05-28 LAB — COMPREHENSIVE METABOLIC PANEL
Albumin: 2.5 g/dL — ABNORMAL LOW (ref 3.4–5.0)
Anion Gap: 9 (ref 7–16)
Chloride: 99 mmol/L (ref 98–107)
Co2: 23 mmol/L (ref 21–32)
Creatinine: 8.39 mg/dL — ABNORMAL HIGH (ref 0.60–1.30)
EGFR (Non-African Amer.): 5 — ABNORMAL LOW
Glucose: 250 mg/dL — ABNORMAL HIGH (ref 65–99)
Potassium: 5 mmol/L (ref 3.5–5.1)
SGOT(AST): 74 U/L — ABNORMAL HIGH (ref 15–37)
SGPT (ALT): 24 U/L (ref 12–78)
Sodium: 131 mmol/L — ABNORMAL LOW (ref 136–145)
Total Protein: 7.4 g/dL (ref 6.4–8.2)

## 2013-05-28 LAB — CBC
HCT: 30.6 % — ABNORMAL LOW (ref 35.0–47.0)
HCT: 25.2 % — ABNORMAL LOW (ref 36.0–46.0)
HGB: 10 g/dL — ABNORMAL LOW (ref 12.0–16.0)
Hemoglobin: 8.4 g/dL — ABNORMAL LOW (ref 12.0–15.0)
MCH: 33 pg (ref 26.0–34.0)
MCH: 34 pg (ref 26.0–34.0)
MCHC: 32.5 g/dL (ref 32.0–36.0)
MCV: 102 fL — ABNORMAL HIGH (ref 78.0–100.0)
RDW: 17.6 % — ABNORMAL HIGH (ref 11.5–14.5)
RDW: 16.7 % — ABNORMAL HIGH (ref 11.5–15.5)
WBC: 13.5 10*3/uL — ABNORMAL HIGH (ref 3.6–11.0)
WBC: 13 10*3/uL — ABNORMAL HIGH (ref 4.0–10.5)

## 2013-05-28 LAB — RENAL FUNCTION PANEL
Albumin: 2.2 g/dL — ABNORMAL LOW (ref 3.5–5.2)
BUN: 39 mg/dL — ABNORMAL HIGH (ref 6–23)
CO2: 26 mEq/L (ref 19–32)
Chloride: 91 mEq/L — ABNORMAL LOW (ref 96–112)
Creatinine, Ser: 8.23 mg/dL — ABNORMAL HIGH (ref 0.50–1.10)
Glucose, Bld: 222 mg/dL — ABNORMAL HIGH (ref 70–99)
Phosphorus: 5.2 mg/dL — ABNORMAL HIGH (ref 2.3–4.6)
Potassium: 4.7 mEq/L (ref 3.5–5.1)

## 2013-05-28 LAB — CK-MB
CK-MB: 15.1 ng/mL — ABNORMAL HIGH (ref 0.5–3.6)
CK-MB: 15.4 ng/mL — ABNORMAL HIGH (ref 0.5–3.6)

## 2013-05-28 LAB — TROPONIN I
Troponin-I: 4.4 ng/mL — ABNORMAL HIGH
Troponin-I: 11 ng/mL — ABNORMAL HIGH

## 2013-05-28 LAB — HEPATITIS B SURFACE ANTIGEN: Hepatitis B Surface Ag: NEGATIVE

## 2013-05-28 LAB — GLUCOSE, CAPILLARY: Glucose-Capillary: 129 mg/dL — ABNORMAL HIGH (ref 70–99)

## 2013-05-28 LAB — APTT: Activated PTT: 32 secs (ref 23.6–35.9)

## 2013-05-28 LAB — PRO B NATRIURETIC PEPTIDE: Pro B Natriuretic peptide (BNP): >70000 pg/mL — ABNORMAL HIGH (ref 0.0–100.0)

## 2013-05-28 SURGERY — LEFT HEART CATHETERIZATION WITH CORONARY ANGIOGRAM
Anesthesia: LOCAL

## 2013-05-28 MED ORDER — LIDOCAINE HCL (PF) 1 % IJ SOLN
5.0000 mL | INTRAMUSCULAR | Status: DC | PRN
  Filled 2013-05-28: qty 5

## 2013-05-28 MED ORDER — SODIUM CHLORIDE 0.9 % IV SOLN: 250.0000 mL | INTRAVENOUS | Status: DC | PRN

## 2013-05-28 MED ORDER — ACETAMINOPHEN-CODEINE #3 300-30 MG PO TABS
1.0000 | ORAL_TABLET | ORAL | Status: DC | PRN
  Administered 2013-05-31 – 2013-06-02 (×4): 1 via ORAL
  Filled 2013-05-28 (×4): qty 1

## 2013-05-28 MED ORDER — ALTEPLASE 2 MG IJ SOLR: 2.0000 mg | Freq: Once | INTRAMUSCULAR | Status: AC | PRN

## 2013-05-28 MED ORDER — HEPARIN SODIUM (PORCINE) 1000 UNIT/ML DIALYSIS
1000.0000 [IU] | INTRAMUSCULAR | Status: DC | PRN
  Filled 2013-05-28: qty 1

## 2013-05-28 MED ORDER — FLUTICASONE PROPIONATE 50 MCG/ACT NA SUSP
1.0000 | Freq: Every day | NASAL | Status: DC
  Administered 2013-06-01 – 2013-06-03 (×2): 1 via NASAL
  Filled 2013-05-28: qty 16

## 2013-05-28 MED ORDER — ATORVASTATIN CALCIUM 80 MG PO TABS
80.0000 mg | ORAL_TABLET | Freq: Every day | ORAL | Status: DC
  Administered 2013-05-28 – 2013-06-02 (×5): 80 mg via ORAL
  Filled 2013-05-28 (×7): qty 1

## 2013-05-28 MED ORDER — NITROGLYCERIN 0.2 MG/ML ON CALL CATH LAB
INTRAVENOUS | Status: AC
  Filled 2013-05-28: qty 1

## 2013-05-28 MED ORDER — PENTAFLUOROPROP-TETRAFLUOROETH EX AERO: 1.0000 "application " | INHALATION_SPRAY | CUTANEOUS | Status: DC | PRN

## 2013-05-28 MED ORDER — SODIUM CHLORIDE 0.9 % IV SOLN
INTRAVENOUS | Status: DC
  Administered 2013-05-29: 20 mL/h via INTRAVENOUS

## 2013-05-28 MED ORDER — INSULIN DETEMIR 100 UNIT/ML FLEXPEN: 20.0000 [IU] | PEN_INJECTOR | Freq: Every day | SUBCUTANEOUS | Status: DC

## 2013-05-28 MED ORDER — LABETALOL HCL 5 MG/ML IV SOLN
5.0000 mg | INTRAVENOUS | Status: DC | PRN
  Administered 2013-06-02: 5 mg via INTRAVENOUS
  Filled 2013-05-28: qty 4

## 2013-05-28 MED ORDER — ASPIRIN 81 MG PO CHEW
81.0000 mg | CHEWABLE_TABLET | Freq: Every day | ORAL | Status: DC
  Administered 2013-05-28 – 2013-06-03 (×7): 81 mg via ORAL
  Filled 2013-05-28 (×7): qty 1

## 2013-05-28 MED ORDER — CEFTRIAXONE SODIUM 1 G IJ SOLR
1.0000 g | INTRAMUSCULAR | Status: DC
  Administered 2013-05-28 – 2013-05-29 (×2): 1 g via INTRAVENOUS
  Filled 2013-05-28 (×3): qty 10

## 2013-05-28 MED ORDER — ALBUTEROL SULFATE HFA 108 (90 BASE) MCG/ACT IN AERS
1.0000 | INHALATION_SPRAY | RESPIRATORY_TRACT | Status: DC | PRN
  Administered 2013-06-01: 1 via RESPIRATORY_TRACT
  Filled 2013-05-28: qty 6.7

## 2013-05-28 MED ORDER — HEPARIN SODIUM (PORCINE) 1000 UNIT/ML IJ SOLN
INTRAMUSCULAR | Status: AC
  Filled 2013-05-28: qty 1

## 2013-05-28 MED ORDER — LEVOFLOXACIN IN D5W 750 MG/150ML IV SOLN
750.0000 mg | INTRAVENOUS | Status: DC
  Administered 2013-05-28: 750 mg via INTRAVENOUS
  Filled 2013-05-28: qty 150

## 2013-05-28 MED ORDER — INSULIN DETEMIR 100 UNIT/ML ~~LOC~~ SOLN
20.0000 [IU] | Freq: Every day | SUBCUTANEOUS | Status: DC
  Administered 2013-05-28 – 2013-05-29 (×2): 20 [IU] via SUBCUTANEOUS
  Filled 2013-05-28 (×4): qty 0.2

## 2013-05-28 MED ORDER — LIDOCAINE-PRILOCAINE 2.5-2.5 % EX CREA
1.0000 | TOPICAL_CREAM | CUTANEOUS | Status: DC | PRN
Start: 2013-05-28 — End: 2013-06-03

## 2013-05-28 MED ORDER — HEPARIN SODIUM (PORCINE) 1000 UNIT/ML DIALYSIS: 2000.0000 [IU] | INTRAMUSCULAR | Status: DC | PRN

## 2013-05-28 MED ORDER — SODIUM CHLORIDE 0.9 % IJ SOLN: 3.0000 mL | INTRAMUSCULAR | Status: DC | PRN

## 2013-05-28 MED ORDER — HEPARIN (PORCINE) IN NACL 2-0.9 UNIT/ML-% IJ SOLN
INTRAMUSCULAR | Status: AC
  Filled 2013-05-28: qty 1000

## 2013-05-28 MED ORDER — HEPARIN SODIUM (PORCINE) 5000 UNIT/ML IJ SOLN
5000.0000 [IU] | Freq: Three times a day (TID) | INTRAMUSCULAR | Status: DC
  Administered 2013-05-29 – 2013-06-03 (×15): 5000 [IU] via SUBCUTANEOUS
  Filled 2013-05-28 (×20): qty 1

## 2013-05-28 MED ORDER — SODIUM CHLORIDE 0.9 % IJ SOLN
3.0000 mL | Freq: Two times a day (BID) | INTRAMUSCULAR | Status: DC
  Administered 2013-05-29 – 2013-06-03 (×10): 3 mL via INTRAVENOUS

## 2013-05-28 MED ORDER — SERTRALINE HCL 50 MG PO TABS
50.0000 mg | ORAL_TABLET | Freq: Every day | ORAL | Status: DC
  Administered 2013-05-28 – 2013-06-03 (×7): 50 mg via ORAL
  Filled 2013-05-28 (×7): qty 1

## 2013-05-28 MED ORDER — SODIUM CHLORIDE 0.9 % IV SOLN: 100.0000 mL | INTRAVENOUS | Status: DC | PRN

## 2013-05-28 MED ORDER — NEPRO/CARBSTEADY PO LIQD: 237.0000 mL | ORAL | Status: DC | PRN

## 2013-05-28 MED ORDER — INSULIN ASPART 100 UNIT/ML ~~LOC~~ SOLN
0.0000 [IU] | Freq: Three times a day (TID) | SUBCUTANEOUS | Status: DC
  Administered 2013-05-31 – 2013-06-01 (×2): 3 [IU] via SUBCUTANEOUS
  Administered 2013-06-01 – 2013-06-02 (×3): 2 [IU] via SUBCUTANEOUS
  Administered 2013-06-02: 5 [IU] via SUBCUTANEOUS
  Administered 2013-06-03: 2 [IU] via SUBCUTANEOUS

## 2013-05-28 MED ORDER — CARVEDILOL 6.25 MG PO TABS
6.2500 mg | ORAL_TABLET | Freq: Two times a day (BID) | ORAL | Status: DC
  Administered 2013-05-29 – 2013-05-30 (×3): 6.25 mg via ORAL
  Filled 2013-05-28 (×6): qty 1

## 2013-05-28 MED ORDER — ONDANSETRON HCL 4 MG/2ML IJ SOLN
4.0000 mg | Freq: Four times a day (QID) | INTRAMUSCULAR | Status: DC | PRN
  Administered 2013-05-28 – 2013-06-03 (×4): 4 mg via INTRAVENOUS
  Filled 2013-05-28 (×5): qty 2

## 2013-05-28 MED ORDER — PREGABALIN 50 MG PO CAPS
50.0000 mg | ORAL_CAPSULE | Freq: Every day | ORAL | Status: DC
  Administered 2013-05-28 – 2013-06-03 (×6): 50 mg via ORAL
  Filled 2013-05-28 (×6): qty 1

## 2013-05-28 MED ORDER — POLYMYXIN B-TRIMETHOPRIM 10000-0.1 UNIT/ML-% OP SOLN
1.0000 [drp] | OPHTHALMIC | Status: DC
  Administered 2013-05-29 – 2013-06-03 (×26): 1 [drp] via OPHTHALMIC
  Filled 2013-05-28 (×2): qty 10

## 2013-05-28 MED ORDER — LIDOCAINE HCL (PF) 1 % IJ SOLN
INTRAMUSCULAR | Status: AC
  Filled 2013-05-28: qty 30

## 2013-05-28 MED ORDER — CLOPIDOGREL BISULFATE 75 MG PO TABS
75.0000 mg | ORAL_TABLET | Freq: Every day | ORAL | Status: DC
  Administered 2013-05-29 – 2013-06-03 (×6): 75 mg via ORAL
  Filled 2013-05-28 (×6): qty 1

## 2013-05-28 MED ORDER — ACETAMINOPHEN 325 MG PO TABS
650.0000 mg | ORAL_TABLET | ORAL | Status: DC | PRN
  Administered 2013-05-29 (×2): 650 mg via ORAL
  Filled 2013-05-28 (×2): qty 2

## 2013-05-28 MED ORDER — PANTOPRAZOLE SODIUM 40 MG IV SOLR
40.0000 mg | INTRAVENOUS | Status: DC
  Administered 2013-05-28 – 2013-05-29 (×2): 40 mg via INTRAVENOUS
  Filled 2013-05-28 (×3): qty 40

## 2013-05-28 MED ORDER — LABETALOL HCL 5 MG/ML IV SOLN
INTRAVENOUS | Status: AC
  Administered 2013-05-29: 5 mg
  Filled 2013-05-28: qty 4

## 2013-05-28 NOTE — Consult Note (Addendum)
Renal Service Consult Note [facility]  Theresa [name] 05/28/2013 Sol [place]azing Requesting Physician:  Dr Irish Lack  Reason for Consult:  ESRD patient with resp distress and acute MI  HPI: The patient is a 57 y.o. year-old with hx of DM, HTN, COPD and HL.  Former smoker.  She presented earlier today to [facility] with subacute onset of SOB, cough, back and chest pain.  Troponin was 4.0. She was started on heparin and admitted and sent to dialysis.  In dialysis she had worsening chest pain and back pain.  Repeat trop was 9.0 and EKG showed diffuse ST segment depression in inferior and anterolateral leads.  She appeared quite ill and was sent to [facility] for further management.  Pt underwent heart cath which showed patent L main, LAD and LCx but severe RCA stenosis.  The lesion was dilated and stented with a drug-eluting stent.  LVEDP was 30 mmHG and post procedure patient is requiring Bipap for resp support.  Renal asked to see patient for resp distress, ?need for dialysis.  Patient is calm on bipap.  She gets dialysis with [facility] in [place], [place], on a TTS schedule.  She started dialysis in Feb 2014.  Etiology of renal failure not known, likely diabetes.    Patient's breathing is better now. No active CP, stomach hurts.  No recent n/v/d, fevers, chills, visual loss, rash or jt pain or swelling    Chart Review Nov 2012 > repair of complex left eye retinal detachment Mar 2013 > repair of complex right eye retinal detachment May 28, 2013 > acute NSTEMI as above  ROS  no problems with recent dialysis  fistula revised recently , has been working well of late  Past Medical History  Past Medical History  Diagnosis Date  . Hypertension   . ESRD on hemodialysis     Pt gets dialysis with [facility] in [place], [place], on a TTS schedule.  She started dialysis in Feb 2014.  Etiology of renal failure not known, likely diabetes.  Has a left upper arm AV graft as of Dec 2014.    Marland Kitchen  COPD (chronic obstructive pulmonary disease)   . Emphysema   . Pneumonia     June 2012  . Bronchitis     Mar 2012  . Peripheral vascular disease   . Chronic constipation   . Colon polyps   . Sickle cell trait   . Anemia   . Diabetes mellitus   . Heart murmur     Dr. Cornelius Moras  . Urinary tract infection     hx of  . Diabetic neuropathy   . Diabetic retinopathy 05/28/2013    Hx bilat retinal detachment, proliferative diab retinopathy and bilat vitreous hemorrhage   . Anemia in chronic kidney disease (CKD) 02/18/2012  . Hyperlipidemia    Past Surgical History  Past Surgical History  Procedure Laterality Date  . Abdominal hysterectomy      2000  . Dilation and curettage of uterus      several in the early 80's  . Eye surgery      bilateral laser 2012  . Esophagogastroduodenoscopy      2012  . Colonscopy    . Tubal ligation      1979  . Pars plana vitrectomy  04/22/2011    Procedure: PARS PLANA VITRECTOMY WITH 25 GAUGE;  Surgeon: [name], MD;  Location: [place];  Service: Ophthalmology;  Laterality: Left;  membrane peel, endolaser, gas fluid exchange, silicone oil,  repair of complex traction retinal detachment  . Pars plana vitrectomy  09/30/2011    Procedure: PARS PLANA VITRECTOMY WITH 25 GAUGE;  Surgeon: [name], MD;  Location: [place];  Service: Ophthalmology;  Laterality: Right;  Endolaser; Repair of Complex Traction Retinal Detachment  . Gas/fluid exchange  09/30/2011    Procedure: GAS/FLUID EXCHANGE;  Surgeon: [name], MD;  Location: [place];  Service: Ophthalmology;  Laterality: Right;  . Gas insertion  09/30/2011    Procedure: INSERTION OF GAS;  Surgeon: [name], MD;  Location: [place];  Service: Ophthalmology;  Laterality: Right;  [id number]  . Eye surgery      right  . Pars plana vitrectomy  02/24/2012    Procedure: PARS PLANA VITRECTOMY WITH 25 GAUGE;  Surgeon: [name], MD;  Location: [place];  Service: Ophthalmology;  Laterality: Left;  . Silicon  oil removal  [id number]    Procedure: SILICON OIL REMOVAL;  Surgeon: [name], MD;  Location: [place];  Service: Ophthalmology;  Laterality: Left;  . Ptca    . Thrombectomy / arteriovenous graft revision     Family History  Family History  Problem Relation Age of Onset  . Anesthesia problems Neg Hx   . Hypotension Neg Hx   . Malignant hyperthermia Neg Hx   . Pseudochol deficiency Neg Hx   . Stroke Mother   . Heart attack Mother   . Heart disease Mother   . Colon cancer Father   . Colon cancer Sister   . Heart attack Brother   . Stroke Brother   . Diabetes Brother   . Breast cancer Sister   . Diabetes Brother   . Diabetes Daughter    Social History  reports that she quit smoking about 2 years ago. She does not have any smokeless tobacco history on file. She reports that she does not drink alcohol or use illicit drugs. Allergies  Allergies  Allergen Reactions  . Hydrocodone    Home medications Prior to Admission medications   Medication Sig Start Date End Date Taking? Authorizing Provider  aspirin EC 81 MG tablet Take 81 mg by mouth daily.   Yes Historical Provider, MD  carvedilol (COREG) 6.25 MG tablet Take 1 tablet (6.25 mg total) by mouth 2 (two) times daily. 01/21/13  Yes Jackolyn Confer, MD  furosemide (LASIX) 40 MG tablet Take 1 tablet (40 mg total) by mouth daily. 03/19/12  Yes Jackolyn Confer, MD  losartan (COZAAR) 25 MG tablet Take 4 tablets (100 mg total) by mouth daily. 01/21/13  Yes Jackolyn Confer, MD  acetaminophen-codeine (TYLENOL #3) 300-30 MG per tablet Take 1 tablet by mouth every 4 (four) hours as needed.  04/15/13   Historical Provider, MD  albuterol (PROAIR HFA) 108 (90 BASE) MCG/ACT inhaler Inhale 1 puff into the lungs every 4 (four) hours as needed.     Historical Provider, MD  Insulin Detemir 100 UNIT/ML SOPN Inject 20 Units into the skin daily. 04/19/13   Jackolyn Confer, MD  Insulin Pen Needle 32G X 6 MM MISC 1 Units by Does not apply route as  needed. 11/09/12   Jackolyn Confer, MD  mometasone (NASONEX) 50 MCG/ACT nasal spray Place 2 sprays into the nose daily. 2 sprays by Each Nare route daily as needed.    Historical Provider, MD  pregabalin (LYRICA) 50 MG capsule Take 50 mg by mouth daily.    Historical Provider, MD  rosuvastatin (CRESTOR) 40 MG tablet  Take 1 tablet (40 mg total) by mouth daily. 11/12/12   Jackolyn Confer, MD  sertraline (ZOLOFT) 50 MG tablet Take 1 tablet (50 mg total) by mouth daily. 01/31/13   Jackolyn Confer, MD  trimethoprim-polymyxin b (POLYTRIM) ophthalmic solution Place 1 drop into both eyes every 4 (four) hours. 03/28/13   Charolette Forward, NP   Liver Function Tests No results found for this basename: AST, ALT, ALKPHOS, BILITOT, PROT, ALBUMIN,  in the last 168 hours No results found for this basename: LIPASE, AMYLASE,  in the last 168 hours CBC  Recent Labs Lab 05/28/13 1742  WBC 13.0*  HGB 8.4*  HCT 25.2*  MCV 102.0*  PLT 123456*   Basic Metabolic Panel No results found for this basename: NA, K, CL, CO2, GLUCOSE, BUN, CREATININE, ALB, CALCIUM, PHOS,  in the last 168 hours  Exam  Blood pressure 141/55, pulse 85, temperature 98.6 F (37 C), temperature source Axillary, resp. rate 29, weight 78.7 kg (173 lb 8 oz), SpO2 100.00%.  gen: on bipap, calm responsive  skin: no rash, cyanosis  heent: eomi, sclera anicteric, throat not examined  neck: + jvd, no bruit  chest: bilat basilar faint rales  cor: regular, no M or rub, pedal pulses intact  abd: soft, mod obese, nt, nd, +bs  ext: trace ankle edema bilat, no joint effusion, no gangrene/ulcers  neuro: alert, ox3, nonfocal  access: left arm AVF  Renal panel pending Hb 8.4   WBC 13k   plt 144  Dialysis: [facility] TTS Dry wt 78 kg per patient  Assessment/Plan: 1. Chest pain / acute NSTEMI sp stent to RCA today 2. Resp distress / bilat pulm infiltrates: focal densities appear more like pna than edema on CT. On empiric abx per CCM 3. ESRD:  did not get dialysis today, plan HD today asap 4. Anemia / CKD: Hb 8.4, get records, consider epo/esa 5. MBD / CKD: Ca/Phos pending, get records 6. HTN/volume: on cozaar and coreg at home, also lasix; hold lasix, cont other BP meds as needed. Vol excess by LVEDP although on exam and by weights pt does not look grossly overloaded. Will try for max UF (4kg) today nonetheless, perhaps she has lost body wt 7. Hx DM / retinopathy 8. Hx COPD, former smoker  Will follow   Kelly Splinter MD (pgr) [phone]    [phone] 05/28/2013, 6:54 PM

## 2013-05-28 NOTE — Progress Notes (Signed)
Placed patient on a non-rebreather mask at this time per MD order. Patient had been on the BIPAP machine while receiving dialysis.  RT will monitor for about 1 hour, then call the doctor to confirm more orders on the patient. RT will continue to assist as needed.

## 2013-05-28 NOTE — Consult Note (Signed)
Name: Theresa [name] MRN: [id number] DOB: 1955/09/23    ADMISSION DATE:  05/28/2013 CONSULTATION DATE:  05/28/13     REFERRING MD :  Irish Lack, MD  PRIMARY SERVICE: Cards  CHIEF COMPLAINT:  SOB, hypoxic resp distress  BRIEF PATIENT DESCRIPTION: 57 yr ESRD, MI, STENT RCA, edema, NIMV  SIGNIFICANT EVENTS / STUDIES:  1. 12/20 cath>>>Patent left main coronary artery. 2. Patent left anterior descending artery and its branches. 3. Patent left circumflex artery and its branches. 4. 95% mid vessel stenosis of the right coronary artery. This was successfully stented with a 2.5 x 16 Promus drug eluting stent post dilated to 2.78 mm. 5. LVEDP 30 mmHg.   12/20- stent rca, MI, edema, nimv, urgent HD planned 12/20 CT chest (see disc [place])>>> small effusion rt, bilateral basilar infiltrates patchy, upper edema  LINES / TUBES: piv 12/20 rt venous [place] (cath lab)>>>  CULTURES:  ANTIBIOTICS:  HISTORY OF PRESENT ILLNESS:  57 year old woman with a history of end-stage renal disease on hemodialysis. She also has a history of diabetes, hypertension, hyperlipidemia and COPD. She is a former tobacco user. She was admitted this morning with worsening shortness of breath, cough, back pain and chest pain. Trop elevation, HD needed.  During dialysis, she had worsening of her upper back pain, chest pain or shortness of breath. A subsequent troponin was 9.0. ECG showed diffuse ST segment depression in the inferior and anterolateral leads. To cone, cath, stent rca, MI. Some concern edema from radiology pcxr reports. Distress, NIMV.  PAST MEDICAL HISTORY :  Past Medical History  Diagnosis Date  . Hypertension   . Hyperlipidemia   . COPD (chronic obstructive pulmonary disease)   . Emphysema   . Shortness of breath     more with exertion  . Pneumonia     June 2012  . Bronchitis     Mar 2012  . Peripheral vascular disease   . Varicose vein   . Edema extremities     legs occasionally  .  Chronic constipation   . Colon polyps   . Sickle cell trait   . Anemia   . Diabetes mellitus   . Chronic kidney disease   . Heart murmur     Dr. Cornelius Moras  . Urinary tract infection     hx of  . Diabetic neuropathy    Past Surgical History  Procedure Laterality Date  . Abdominal hysterectomy      2000  . Dilation and curettage of uterus      several in the early 80's  . Eye surgery      bilateral laser 2012  . Esophagogastroduodenoscopy      2012  . Colonscopy    . Tubal ligation      1979  . Pars plana vitrectomy  04/22/2011    Procedure: PARS PLANA VITRECTOMY WITH 25 GAUGE;  Surgeon: [name], MD;  Location: [place];  Service: Ophthalmology;  Laterality: Left;  membrane peel, endolaser, gas fluid exchange, silicone oil, repair of complex traction retinal detachment  . Pars plana vitrectomy  09/30/2011    Procedure: PARS PLANA VITRECTOMY WITH 25 GAUGE;  Surgeon: [name], MD;  Location: [place];  Service: Ophthalmology;  Laterality: Right;  Endolaser; Repair of Complex Traction Retinal Detachment  . Gas/fluid exchange  09/30/2011    Procedure: GAS/FLUID EXCHANGE;  Surgeon: [name], MD;  Location: [place];  Service: Ophthalmology;  Laterality: Right;  . Gas insertion  09/30/2011  Procedure: INSERTION OF GAS;  Surgeon: [name], MD;  Location: [place];  Service: Ophthalmology;  Laterality: Right;  C3F8  . Eye surgery      right  . Pars plana vitrectomy  02/24/2012    Procedure: PARS PLANA VITRECTOMY WITH 25 GAUGE;  Surgeon: [name], MD;  Location: [place];  Service: Ophthalmology;  Laterality: Left;  . Silicon oil removal  [id number]    Procedure: SILICON OIL REMOVAL;  Surgeon: [name], MD;  Location: [place];  Service: Ophthalmology;  Laterality: Left;  . Ptca    . Thrombectomy / arteriovenous graft revision     Prior to Admission medications   Medication Sig Start Date End Date Taking? Authorizing Provider  aspirin EC 81 MG tablet Take 81 mg by  mouth daily.   Yes Historical Provider, MD  carvedilol (COREG) 6.25 MG tablet Take 1 tablet (6.25 mg total) by mouth 2 (two) times daily. 01/21/13  Yes Jackolyn Confer, MD  furosemide (LASIX) 40 MG tablet Take 1 tablet (40 mg total) by mouth daily. 03/19/12  Yes Jackolyn Confer, MD  losartan (COZAAR) 25 MG tablet Take 4 tablets (100 mg total) by mouth daily. 01/21/13  Yes Jackolyn Confer, MD  acetaminophen-codeine (TYLENOL #3) 300-30 MG per tablet Take 1 tablet by mouth every 4 (four) hours as needed.  04/15/13   Historical Provider, MD  albuterol (PROAIR HFA) 108 (90 BASE) MCG/ACT inhaler Inhale 1 puff into the lungs every 4 (four) hours as needed.     Historical Provider, MD  Insulin Detemir 100 UNIT/ML SOPN Inject 20 Units into the skin daily. 04/19/13   Jackolyn Confer, MD  Insulin Pen Needle 32G X 6 MM MISC 1 Units by Does not apply route as needed. 11/09/12   Jackolyn Confer, MD  mometasone (NASONEX) 50 MCG/ACT nasal spray Place 2 sprays into the nose daily. 2 sprays by Each Nare route daily as needed.    Historical Provider, MD  pregabalin (LYRICA) 50 MG capsule Take 50 mg by mouth daily.    Historical Provider, MD  rosuvastatin (CRESTOR) 40 MG tablet Take 1 tablet (40 mg total) by mouth daily. 11/12/12   Jackolyn Confer, MD  sertraline (ZOLOFT) 50 MG tablet Take 1 tablet (50 mg total) by mouth daily. 01/31/13   Jackolyn Confer, MD  trimethoprim-polymyxin b (POLYTRIM) ophthalmic solution Place 1 drop into both eyes every 4 (four) hours. 03/28/13   Charolette Forward, NP   Allergies  Allergen Reactions  . Hydrocodone     FAMILY HISTORY:  Family History  Problem Relation Age of Onset  . Anesthesia problems Neg Hx   . Hypotension Neg Hx   . Malignant hyperthermia Neg Hx   . Pseudochol deficiency Neg Hx   . Stroke Mother   . Heart attack Mother   . Heart disease Mother   . Colon cancer Father   . Colon cancer Sister   . Heart attack Brother   . Stroke Brother   . Diabetes Brother     . Breast cancer Sister   . Diabetes Brother   . Diabetes Daughter    SOCIAL HISTORY:  reports that she quit smoking about 2 years ago. She does not have any smokeless tobacco history on file. She reports that she does not drink alcohol or use illicit drugs.  REVIEW OF SYSTEMS:  Unable, distress on mask  SUBJECTIVE: NIMV, distress 12/6   VITAL SIGNS: Temp:  [99.9 F (37.7 C)]  99.9 F (37.7 C) (12/20 1645) Pulse Rate:  [95-98] 98 (12/20 1645) Resp:  [31-37] 31 (12/20 1645) BP: (141-158)/(16-60) 158/60 mmHg (12/20 1645) SpO2:  [100 %] 100 % (12/20 1645) HEMODYNAMICS:   VENTILATOR SETTINGS:   INTAKE / OUTPUT: Intake/Output   None     PHYSICAL EXAMINATION: General:  Better with NIMV  Neuro:  Calmer, alert, oriented, anxiety  HEENT:  jvd noted  Cardiovascular:  s1 s2 rrr not tachy  Lungs:  Coarse bases  Abdomen:  Soft, bs wnl no r Musculoskeletal:  Edema none Skin:  No rash  LABS:  CBC No results found for this basename: WBC, HGB, HCT, PLT,  in the last 168 hours Coag's No results found for this basename: APTT, INR,  in the last 168 hours BMET No results found for this basename: NA, K, CL, CO2, BUN, CREATININE, GLUCOSE,  in the last 168 hours Electrolytes No results found for this basename: CALCIUM, MG, PHOS,  in the last 168 hours Sepsis Markers No results found for this basename: LATICACIDVEN, PROCALCITON, O2SATVEN,  in the last 168 hours ABG No results found for this basename: PHART, PCO2ART, PO2ART,  in the last 168 hours Liver Enzymes No results found for this basename: AST, ALT, ALKPHOS, BILITOT, ALBUMIN,  in the last 168 hours Cardiac Enzymes No results found for this basename: TROPONINI, PROBNP,  in the last 168 hours Glucose No results found for this basename: GLUCAP,  in the last 168 hours  Imaging No results found.   CXR: [place] disc>>>  ASSESSMENT / PLAN:  PULMONARY A: Acute resp failure, pulm edema main issue, PNA on CT P:   STAT nimv,  bipap, x 4 hrs with urgent HD Unlikely her infiltrates alone would cause degree of distress Hope to evaluate resp status off nimv after HD pcxr in am  O2 support STAT volume removal See ID  CARDIOVASCULAR A: s/p MI, edema P:  Per cards antiplat Beta blocker Repeat echo tele  RENAL A:  ESRD, pulm edema P:   Urgent HD Chem in am kvo  GASTROINTESTINAL A:  distress P:   Npo ensure ppi  HEMATOLOGIC A:  MI, dvt prevntion P:  Cbc plavix Sub q hep  INFECTIOUS A:  PNA patchy on CT, r/o cap P:   Ceftriaxone Levo Strep ag Sputum urine strep  ENDOCRINE A:  DM  P:   ssi  NEUROLOGIC A:  NIMV P:   May need low dose benzo  TODAY'S SUMMARY: MI, ESRD, stent, distress, edema big issue, does in fact have bronchogram's and infiltrate on CT chest, add abx, re eval NIMV after HD  I have personally obtained a history, examined the patient, evaluated laboratory and imaging results, formulated the assessment and plan and placed orders. CRITICAL CARE: The patient is critically ill with multiple organ systems failure and requires high complexity decision making for assessment and support, frequent evaluation and titration of therapies, application of advanced monitoring technologies and extensive interpretation of multiple databases. Critical Care Time devoted to patient care services described in this note is 35 minutes.    Lavon Paganini. Titus Mould, MD, [place] Pgr: [facility]  Pulmonary and [place] Pager: [phone]  05/28/2013, 5:37 PM

## 2013-05-28 NOTE — CV Procedure (Signed)
    PROCEDURE:  Left heart catheterization with selective coronary angiography, left ventriculogram.  INDICATIONS:  NSTEMI affecting inferior wall  The risks, benefits, and details of the procedure were explained to the patient.  The patient verbalized understanding and wanted to proceed.  Informed written consent was obtained.  PROCEDURE TECHNIQUE:  After Xylocaine anesthesia a 80F sheath was placed in the right femoral artery with a single anterior needle wall stick.   Left coronary angiography was done using a Judkins L4 guide catheter.  Right coronary angiography was done using a Judkins R4 guide catheter.  Left ventriculography was done using a pigtail catheter.    CONTRAST:  Total of 135 cc.  COMPLICATIONS:  None.    HEMODYNAMICS:  Aortic pressure was 138/64; LV pressure was 140/15; LVEDP 30.  There was no gradient between the left ventricle and aorta.    ANGIOGRAPHIC DATA:   The left main coronary artery is widely patent proximally.  There is a 20% distal left main lesion.  The left anterior descending artery is a large vessel.  THere is mild, diffuse atherosclerosis throughout the vessel. There are several medium sized diagonals which are widely patent.  The left circumflex artery is a small vessel which is patent.  There is a medium sized OM1 which is widely patent.  The right coronary artery is a large dominant vessel.  There is a 95% mid vessel stenosis with TIMI 3 flow on the initial angiogram.  There is a medium size posterolateral artery. There appear to be a dual PDA arteries.  The first one, had a proximal 80% stenosis with disease extending to the ostium.  LEFT VENTRICULOGRAM:  Left ventricular angiogram was not done.  LVEDP was 30 mmHg.  PCI NARRATIVE: A JR4 guiding catheter was used to engage the right coronary artery. IV heparin was used for anticoagulation. An ACT was used to check that the heparin was therapeutic. A pro-water wire was placed across the area disease in  the right coronary artery. A 2.0 x 12 lm was used to predilate the area. A 2.5 x 16 promise drug-eluting stent was used to treat the entire diseased segment in the mid right coronary artery. A 2.75 x 8 and see you for a balloon was used to post dilate the stent with 2 inflations, up to 16 atmospheres. There is no residual stenosis. There is an excellent angiographic result.  Several doses of nitroglycerin were given intracoronary to treat vasospasm.   IMPRESSIONS:  1. Patent left main coronary artery. 2. Patent left anterior descending artery and its branches. 3. Patent left circumflex artery and its branches. 4. 95% mid vessel stenosis of the right coronary artery.  This was successfully stented with a 2.5 x 16 Promus drug eluting stent post dilated to 2.78 mm. 5. LVEDP 30 mmHg.    RECOMMENDATION:   Echo to evaluate left ventricular systolic function.  Pulmonary consult called in to Dr. Melvyn Novas.  Will not start antibiotics at this time although pneumonia was presumed by xray at [facility].  Call Nephrology for likely dialysis today.  Elevated LVEDP indicating fluid overload.  She will need aspirin and Plavix for at least a year. She needs aggressive secondary prevention.

## 2013-05-28 NOTE — H&P (Signed)
Admit date: 05/28/2013 Referring Physician Dr. Angelena Form Primary Cardiologist: Dr. Rockey Situ Chief complaint/reason for admission: Chest discomfort, shortness of breath, abnormal troponin  HPI: 57 year old woman with a history of end-stage renal disease on hemodialysis. She also has a history of diabetes, hypertension, hyperlipidemia and COPD. She is a former tobacco user. She was admitted this morning with worsening shortness of breath, cough, back pain and chest pain. Her initial troponin was 4.0. She was started on a heparin drip and sent to dialysis. During dialysis, she had worsening of her upper back pain, chest pain or shortness of breath. A subsequent troponin was 9.0. ECG showed diffuse ST segment depression in the inferior and anterolateral leads. This was a change from the ECG that she had a few weeks ago. She was seen in the dialysis unit at [facility] and appeared quite ill. She is sent to [facility] for further management.  She did have a CT scan of her chest showing pneumonia while in our meds. Coronary calcifications were noted.  Upon arrival to [facility], she is now on a BiPAP machine. This was started and transport due to severe shortness of breath. Her oxygen sats are stable. When the BiPAP is removed, she gets very short of breath. She would like to set up. She cannot lie flat at this point. Her chest discomfort is improved.    PMH:    Past Medical History  Diagnosis Date  . Hypertension   . Hyperlipidemia   . COPD (chronic obstructive pulmonary disease)   . Emphysema   . Shortness of breath     more with exertion  . Pneumonia     June 2012  . Bronchitis     Mar 2012  . Peripheral vascular disease   . Varicose vein   . Edema extremities     legs occasionally  . Chronic constipation   . Colon polyps   . Sickle cell trait   . Anemia   . Diabetes mellitus   . Chronic kidney disease   . Heart murmur     Dr. Cornelius Moras  . Urinary tract infection     hx of  . Diabetic  neuropathy     PSH:    Past Surgical History  Procedure Laterality Date  . Abdominal hysterectomy      2000  . Dilation and curettage of uterus      several in the early 80's  . Eye surgery      bilateral laser 2012  . Esophagogastroduodenoscopy      2012  . Colonscopy    . Tubal ligation      1979  . Pars plana vitrectomy  04/22/2011    Procedure: PARS PLANA VITRECTOMY WITH 25 GAUGE;  Surgeon: [name], MD;  Location: [place];  Service: Ophthalmology;  Laterality: Left;  membrane peel, endolaser, gas fluid exchange, silicone oil, repair of complex traction retinal detachment  . Pars plana vitrectomy  09/30/2011    Procedure: PARS PLANA VITRECTOMY WITH 25 GAUGE;  Surgeon: [name], MD;  Location: [place];  Service: Ophthalmology;  Laterality: Right;  Endolaser; Repair of Complex Traction Retinal Detachment  . Gas/fluid exchange  09/30/2011    Procedure: GAS/FLUID EXCHANGE;  Surgeon: [name], MD;  Location: [place];  Service: Ophthalmology;  Laterality: Right;  . Gas insertion  09/30/2011    Procedure: INSERTION OF GAS;  Surgeon: [name], MD;  Location: [place];  Service: Ophthalmology;  Laterality: Right;  C3F8  .  Eye surgery      right  . Pars plana vitrectomy  02/24/2012    Procedure: PARS PLANA VITRECTOMY WITH 25 GAUGE;  Surgeon: [name], MD;  Location: [place];  Service: Ophthalmology;  Laterality: Left;  . Silicon oil removal  [id number]    Procedure: SILICON OIL REMOVAL;  Surgeon: [name], MD;  Location: [place];  Service: Ophthalmology;  Laterality: Left;  . Ptca    . Thrombectomy / arteriovenous graft revision      ALLERGIES:   Hydrocodone  Prior to Admit Meds:   Prescriptions prior to admission  Medication Sig Dispense Refill  . acetaminophen-codeine (TYLENOL #3) 300-30 MG per tablet Take 1 tablet by mouth every 4 (four) hours as needed.       Marland Kitchen albuterol (PROAIR HFA) 108 (90 BASE) MCG/ACT inhaler Inhale 1 puff into the lungs every 4 (four)  hours as needed.       . carvedilol (COREG) 6.25 MG tablet Take 1 tablet (6.25 mg total) by mouth 2 (two) times daily.  60 tablet  6  . furosemide (LASIX) 40 MG tablet Take 1 tablet (40 mg total) by mouth daily.  30 tablet  6  . Insulin Detemir 100 UNIT/ML SOPN Inject 20 Units into the skin daily.  10 pen  5  . Insulin Pen Needle 32G X 6 MM MISC 1 Units by Does not apply route as needed.  90 each  3  . losartan (COZAAR) 25 MG tablet Take 4 tablets (100 mg total) by mouth daily.  30 tablet  6  . mometasone (NASONEX) 50 MCG/ACT nasal spray Place 2 sprays into the nose daily. 2 sprays by Each Nare route daily as needed.      . pregabalin (LYRICA) 50 MG capsule Take 50 mg by mouth daily.      . rosuvastatin (CRESTOR) 40 MG tablet Take 1 tablet (40 mg total) by mouth daily.  30 tablet  6  . sertraline (ZOLOFT) 50 MG tablet Take 1 tablet (50 mg total) by mouth daily.  30 tablet  3  . trimethoprim-polymyxin b (POLYTRIM) ophthalmic solution Place 1 drop into both eyes every 4 (four) hours.  10 mL  0   Family HX:    Family History  Problem Relation Age of Onset  . Anesthesia problems Neg Hx   . Hypotension Neg Hx   . Malignant hyperthermia Neg Hx   . Pseudochol deficiency Neg Hx   . Stroke Mother   . Heart attack Mother   . Heart disease Mother   . Colon cancer Father   . Colon cancer Sister   . Heart attack Brother   . Stroke Brother   . Diabetes Brother   . Breast cancer Sister   . Diabetes Brother   . Diabetes Daughter    Social HX:    History   Social History  . Marital Status: Single    Spouse Name: N/A    Number of Children: 2  . Years of Education: N/A   Occupational History  . [place] History Main Topics  . Smoking status: Former Smoker -- 1.00 packs/day for 25 years    Quit date: 06/09/2010  . Smokeless tobacco: Not on file  . Alcohol Use: No  . Drug Use: No  . Sexual Activity: No   Other Topics Concern  . Not on file   Social History  Narrative  . No narrative on file  ROS:  All 11 ROS were addressed and are negative except what is stated in the HPI  PHYSICAL EXAM Filed Vitals:   05/28/13 1645  BP: 158/60  Pulse: 98  Temp: 99.9 F (37.7 C)  Resp: 31   General: Ill-appearing, short of breath Head: Eyes PERRLA, No xanthomas.   Normal cephalic and atramatic  Lungs:  Scant wheezing bilaterally Heart:   HRRR S1 S2  No JVD.  Abdomen:  abdomen soft and non-tender Msk:  . Normal strength and tone for age. Extremities:  No lower extremity edema.  DP +1 Neuro: Alert and oriented X 3. Psych:  Anxious affect, responds appropriately   Labs:   Lab Results  Component Value Date   WBC 12.4* 02/24/2012   HGB 9.1* 02/24/2012   HCT 25.5* 02/24/2012   MCV 97.7 02/24/2012   PLT 262 02/24/2012   No results found for this basename: NA, K, CL, CO2, BUN, CREATININE, CALCIUM, LABALBU, PROT, BILITOT, ALKPHOS, ALT, AST, GLUCOSE,  in the last 168 hours Lab Results  Component Value Date   CKTOTAL 170 01/21/2013   No results found for this basename: PTT   No results found for this basename: INR, PROTIME     Lab Results  Component Value Date   CHOL 206* 11/12/2012   CHOL 256* 04/29/2011   Lab Results  Component Value Date   HDL 84.60 11/12/2012   HDL 52 04/29/2011   Lab Results  Component Value Date   LDLCALC 138 04/29/2011   Lab Results  Component Value Date   TRIG 70.0 11/12/2012   TRIG 328* 04/29/2011   Lab Results  Component Value Date   CHOLHDL 2 11/12/2012   Lab Results  Component Value Date   LDLDIRECT 100.6 11/12/2012      Radiology:  @[id number] @  EKG:  Sinus tachycardia with ST segment depressions in the inferior and lateral leads  ASSESSMENT: Non-ST segment elevation MI with significantly elevated cardiac enzymes  PLAN:  Plan for emergent cardiac catheterization. She will likely need dialysis today as well. Will get a pulmonary consult as well. We'll not start any antibiotics as the abnormal chest x-ray  that she had at [facility] may be due to fluid overload.  Her chest x-ray there showed bilateral infiltrates.  Further plans will depend on her cath findings.  We'll need to continue blood pressure line drugs for hypertension. She is on Crestor for hyperlipidemia. We'll also start sliding scale insulin.  [name]., MD  05/28/2013  5:03 PM

## 2013-05-28 NOTE — Progress Notes (Signed)
Chaplain paged for code stemi. Per Ed nurse patient coming from [place] and will be taken directly to the Cath Lab upon arrival.

## 2013-05-29 ENCOUNTER — Inpatient Hospital Stay (HOSPITAL_COMMUNITY): Payer: BC Managed Care – PPO

## 2013-05-29 DIAGNOSIS — I10 Essential (primary) hypertension: Secondary | ICD-10-CM

## 2013-05-29 DIAGNOSIS — E119 Type 2 diabetes mellitus without complications: Secondary | ICD-10-CM

## 2013-05-29 DIAGNOSIS — N186 End stage renal disease: Secondary | ICD-10-CM

## 2013-05-29 DIAGNOSIS — E785 Hyperlipidemia, unspecified: Secondary | ICD-10-CM

## 2013-05-29 DIAGNOSIS — J9600 Acute respiratory failure, unspecified whether with hypoxia or hypercapnia: Secondary | ICD-10-CM

## 2013-05-29 DIAGNOSIS — I2119 ST elevation (STEMI) myocardial infarction involving other coronary artery of inferior wall: Secondary | ICD-10-CM

## 2013-05-29 DIAGNOSIS — R918 Other nonspecific abnormal finding of lung field: Secondary | ICD-10-CM

## 2013-05-29 DIAGNOSIS — Z992 Dependence on renal dialysis: Secondary | ICD-10-CM

## 2013-05-29 LAB — CBC WITH DIFFERENTIAL/PLATELET
Basophils Absolute: 0 10*3/uL (ref 0.0–0.1)
Basophils Relative: 0 % (ref 0–1)
Eosinophils Relative: 0 % (ref 0–5)
HCT: 23.6 % — ABNORMAL LOW (ref 36.0–46.0)
Lymphocytes Relative: 5 % — ABNORMAL LOW (ref 12–46)
MCH: 33 pg (ref 26.0–34.0)
MCHC: 31.8 g/dL (ref 30.0–36.0)
Monocytes Absolute: 0.2 10*3/uL (ref 0.1–1.0)
Neutro Abs: 9.3 10*3/uL — ABNORMAL HIGH (ref 1.7–7.7)
Platelets: 139 10*3/uL — ABNORMAL LOW (ref 150–400)
RDW: 16.5 % — ABNORMAL HIGH (ref 11.5–15.5)
WBC: 9.9 10*3/uL (ref 4.0–10.5)

## 2013-05-29 LAB — COMPREHENSIVE METABOLIC PANEL
ALT: 18 U/L (ref 0–35)
AST: 43 U/L — ABNORMAL HIGH (ref 0–37)
Albumin: 2.2 g/dL — ABNORMAL LOW (ref 3.5–5.2)
Alkaline Phosphatase: 66 U/L (ref 39–117)
BUN: 17 mg/dL (ref 6–23)
CO2: 31 mEq/L (ref 19–32)
Chloride: 99 mEq/L (ref 96–112)
GFR calc Af Amer: 12 mL/min — ABNORMAL LOW (ref >90)
Glucose, Bld: 155 mg/dL — ABNORMAL HIGH (ref 70–99)
Potassium: 4 mEq/L (ref 3.5–5.1)
Sodium: 140 mEq/L (ref 135–145)
Total Bilirubin: 0.3 mg/dL (ref 0.3–1.2)

## 2013-05-29 LAB — POCT I-STAT 3, ART BLOOD GAS (G3+)
Bicarbonate: 31.1 mEq/L — ABNORMAL HIGH (ref 20.0–24.0)
O2 Saturation: 88 %
Patient temperature: 102.6
TCO2: 32 mmol/L (ref 0–100)
pCO2 arterial: 46.4 mmHg — ABNORMAL HIGH (ref 35.0–45.0)
pH, Arterial: 7.442 (ref 7.350–7.450)

## 2013-05-29 LAB — MRSA PCR SCREENING: MRSA by PCR: INVALID — AB

## 2013-05-29 LAB — GLUCOSE, CAPILLARY
Glucose-Capillary: 60 mg/dL — ABNORMAL LOW (ref 70–99)
Glucose-Capillary: 146 mg/dL — ABNORMAL HIGH (ref 70–99)

## 2013-05-29 MED ORDER — MORPHINE SULFATE 4 MG/ML IJ SOLN
4.0000 mg | INTRAMUSCULAR | Status: DC | PRN
  Administered 2013-05-29 (×2): 4 mg via INTRAVENOUS
  Filled 2013-05-29 (×2): qty 1

## 2013-05-29 MED ORDER — MORPHINE SULFATE 2 MG/ML IJ SOLN
INTRAMUSCULAR | Status: AC
  Administered 2013-05-29: 2 mg via INTRAVENOUS
  Filled 2013-05-29: qty 1

## 2013-05-29 MED ORDER — PROMETHAZINE HCL 25 MG/ML IJ SOLN
12.5000 mg | Freq: Once | INTRAMUSCULAR | Status: AC
  Administered 2013-05-29: 12.5 mg via INTRAVENOUS
  Filled 2013-05-29: qty 1

## 2013-05-29 MED ORDER — PROMETHAZINE HCL 25 MG/ML IJ SOLN
12.5000 mg | Freq: Once | INTRAMUSCULAR | Status: AC
  Administered 2013-05-29: 12.5 mg via INTRAVENOUS

## 2013-05-29 MED ORDER — DIPHENHYDRAMINE HCL 25 MG PO CAPS
25.0000 mg | ORAL_CAPSULE | Freq: Four times a day (QID) | ORAL | Status: DC | PRN
  Administered 2013-05-29 – 2013-05-30 (×2): 25 mg via ORAL
  Filled 2013-05-29 (×2): qty 1

## 2013-05-29 MED ORDER — LIDOCAINE HCL (PF) 1 % IJ SOLN
5.0000 mL | INTRAMUSCULAR | Status: DC | PRN
  Filled 2013-05-29: qty 5

## 2013-05-29 MED ORDER — ALTEPLASE 2 MG IJ SOLR
2.0000 mg | Freq: Once | INTRAMUSCULAR | Status: AC | PRN
  Filled 2013-05-29: qty 2

## 2013-05-29 MED ORDER — SODIUM CHLORIDE 0.9 % IV SOLN: 100.0000 mL | INTRAVENOUS | Status: DC | PRN

## 2013-05-29 MED ORDER — MORPHINE SULFATE 2 MG/ML IJ SOLN
2.0000 mg | Freq: Once | INTRAMUSCULAR | Status: AC
  Administered 2013-05-29: 2 mg via INTRAVENOUS

## 2013-05-29 MED ORDER — LIDOCAINE-PRILOCAINE 2.5-2.5 % EX CREA
1.0000 "application " | TOPICAL_CREAM | CUTANEOUS | Status: DC | PRN
  Filled 2013-05-29: qty 5

## 2013-05-29 MED ORDER — MORPHINE SULFATE 2 MG/ML IJ SOLN
INTRAMUSCULAR | Status: AC
  Filled 2013-05-29: qty 1

## 2013-05-29 MED ORDER — MORPHINE SULFATE 2 MG/ML IJ SOLN: 2.0000 mg | INTRAMUSCULAR | Status: DC | PRN

## 2013-05-29 MED ORDER — FUROSEMIDE 10 MG/ML IJ SOLN
80.0000 mg | Freq: Once | INTRAMUSCULAR | Status: AC
  Administered 2013-05-29: 80 mg via INTRAVENOUS
  Filled 2013-05-29: qty 8

## 2013-05-29 MED ORDER — HEPARIN SODIUM (PORCINE) 1000 UNIT/ML DIALYSIS
1000.0000 [IU] | INTRAMUSCULAR | Status: DC | PRN
  Filled 2013-05-29: qty 1

## 2013-05-29 MED ORDER — PROMETHAZINE HCL 25 MG/ML IJ SOLN
12.5000 mg | Freq: Three times a day (TID) | INTRAMUSCULAR | Status: DC | PRN
  Administered 2013-05-29: 12.5 mg via INTRAVENOUS
  Filled 2013-05-29: qty 1

## 2013-05-29 MED ORDER — LEVOFLOXACIN IN D5W 500 MG/100ML IV SOLN
500.0000 mg | INTRAVENOUS | Status: DC
  Filled 2013-05-29: qty 100

## 2013-05-29 MED ORDER — ONDANSETRON HCL 4 MG/2ML IJ SOLN
4.0000 mg | Freq: Once | INTRAMUSCULAR | Status: AC
  Administered 2013-05-29: 4 mg via INTRAVENOUS

## 2013-05-29 MED ORDER — PENTAFLUOROPROP-TETRAFLUOROETH EX AERO: 1.0000 "application " | INHALATION_SPRAY | CUTANEOUS | Status: DC | PRN

## 2013-05-29 MED ORDER — HEPARIN SODIUM (PORCINE) 1000 UNIT/ML DIALYSIS
2000.0000 [IU] | INTRAMUSCULAR | Status: DC | PRN
  Filled 2013-05-29: qty 2

## 2013-05-29 MED ORDER — NEPRO/CARBSTEADY PO LIQD
237.0000 mL | ORAL | Status: DC | PRN
  Filled 2013-05-29: qty 237

## 2013-05-29 MED ORDER — LABETALOL HCL 5 MG/ML IV SOLN
5.0000 mg | Freq: Once | INTRAVENOUS | Status: AC
  Administered 2013-05-29: 5 mg via INTRAVENOUS

## 2013-05-29 NOTE — Progress Notes (Signed)
Called to see patient secondary to shortness of breath.  She presented with inferior NSTEMI and is s/p RCA intervention yesterday. Patient is febrile with temp 102.6 F and BP 165/67. On exam, she has bilateral crackles (right worse than left) and chest x-ray showing pulmonary edema. She reportedly had 2 hours (instead of 4 hours) of HD yesterday.  I will try her on Lasix 80 mg iv, blood cultures x 2 and we notified Nephrology to perform dialysis.  Maryruth Eve, MD

## 2013-05-29 NOTE — Progress Notes (Signed)
Troponin results = 13.58. Dr. Meda Coffee made aware. VSS, no s/s of pain, pt resting. Will continue to monitor.

## 2013-05-29 NOTE — Progress Notes (Signed)
Pt stable at this time not requiring BIPAP. HR 81, sat 100% on 4L [place], and RR18. RT will continue to monitor.

## 2013-05-29 NOTE — Significant Event (Signed)
C/o nausea.  Not improved with zofran.  Will give pheneragan.  Chesley Mires, MD 05/29/2013, 6:31 AM

## 2013-05-29 NOTE — Progress Notes (Signed)
Patient Name: Theresa [name] Date of Encounter: 05/29/2013  Active Problems:   Acute myocardial infarction of other inferior wall, initial episode of care   Acute respiratory failure   Pulmonary infiltrates on CXR   Length of Stay: 1  SUBJECTIVE  The patient states that she feels better than yesterday, no chest pain, but residual SOB.  CURRENT MEDS . aspirin  81 mg Oral Daily  . atorvastatin  80 mg Oral q1800  . carvedilol  6.25 mg Oral BID AC  . cefTRIAXone (ROCEPHIN)  IV  1 g Intravenous Q24H  . clopidogrel  75 mg Oral Q breakfast  . fluticasone  1 spray Each Nare Daily  . heparin  5,000 Units Subcutaneous Q8H  . insulin aspart  0-15 Units Subcutaneous TID WC  . insulin detemir  20 Units Subcutaneous QHS  . levofloxacin (LEVAQUIN) IV  750 mg Intravenous Q48H  . pantoprazole (PROTONIX) IV  40 mg Intravenous Q24H  . pregabalin  50 mg Oral Daily  . sertraline  50 mg Oral Daily  . sodium chloride  3 mL Intravenous Q12H  . trimethoprim-polymyxin b  1 drop Both Eyes Q4H    OBJECTIVE  Filed Vitals:   05/29/13 0700 05/29/13 0830 05/29/13 1156 05/29/13 1211  BP: 153/44 124/40 118/58 133/53  Pulse: 93 88    Temp: 101.4 F (38.6 C)  98.3 F (36.8 C)   TempSrc: Oral  Oral   Resp: 32 27    Weight: 166 lb 14.2 oz (75.7 kg)  166 lb 14.2 oz (75.7 kg)   SpO2: 95% 98%      Intake/Output Summary (Last 24 hours) at 05/29/13 1223 Last data filed at 05/29/13 1000  Gross per 24 hour  Intake 562.33 ml  Output   2700 ml  Net -2137.67 ml   Filed Weights   05/28/13 1739 05/29/13 0700 05/29/13 1156  Weight: 173 lb 8 oz (78.7 kg) 166 lb 14.2 oz (75.7 kg) 166 lb 14.2 oz (75.7 kg)    PHYSICAL EXAM  General: Pleasant, NAD. Neuro: Alert and oriented X 3. Moves all extremities spontaneously. Psych: Normal affect. HEENT:  Normal  Neck: Supple without bruits or JVD. Lungs:  Resp regular and unlabored, B/L crackles. Heart: RRR no s3, s4, or murmurs. Abdomen: Soft, non-tender,  non-distended, BS + x 4.  Extremities: No clubbing, cyanosis or edema. DP/PT/Radials 2+ and equal bilaterally.  Accessory Clinical Findings  CBC  Recent Labs  05/28/13 1742 05/29/13 0500  WBC 13.0* 9.9  NEUTROABS  --  9.3*  HGB 8.4* 7.5*  HCT 25.2* 23.6*  MCV 102.0* 104.0*  PLT 144* [id number]*   Basic Metabolic Panel  Recent Labs  05/28/13 1742 05/29/13 0500  NA 130* 140  K 4.7 4.0  CL 91* 99  CO2 26 31  GLUCOSE 222* 155*  BUN 39* 17  CREATININE 8.23* 4.33*  CALCIUM 7.4* 7.7*  PHOS 5.2*  --    Liver Function Tests  Recent Labs  05/28/13 1742 05/29/13 0500  AST  --  43*  ALT  --  18  ALKPHOS  --  66  BILITOT  --  0.3  PROT  --  5.8*  ALBUMIN 2.2* 2.2*   Cardiac Enzymes  Recent Labs  05/29/13 0430  TROPONINI 13.58*   Dg Chest Port 1 View  05/29/2013    IMPRESSION: Increasingly confluent bilateral opacities, which favor edema over pneumonia.    Dg Chest Port 1 View  05/28/2013   1. Bilateral airspace disease  which could be noncardiogenic edema or pneumonia. Moderate symmetry and dialysis history favors edema. 2. Question small subpulmonic effusion on the left.  Cath 05/28/13 IMPRESSIONS:  1. Patent left main coronary artery. 2. Patent left anterior descending artery and its branches. 3. Patent left circumflex artery and its branches. 4. 95% mid vessel stenosis of the right coronary artery. This was successfully stented with a 2.5 x 16 Promus drug eluting stent post dilated to 2.78 mm. 5. LVEDP 30 mmHg.  TELE: Sinus tachycardia  EKG: Sinus tachycardia with ST segment depressions in the inferior and lateral leads    ASSESSMENT AND PLAN  57 year old woman with a history of end-stage renal disease on hemodialysis. She also has a history of diabetes, hypertension, hyperlipidemia and COPD. She is a former tobacco user. Admitted yesterday with NSTEMI and underwent PCI to RCA.  1. NSTEMI: s/p PCI to RCA yesterday. Chest pain at HD TnI 4-->9, STD -  continue aspirin 81 mg , Plavix, carvedilol 6.25 mg po BID, atorvastatin 80 mg po QHS  2. CHF - acute fluid overload, early terminated HD yesterday, receiving iv fluids in ATB infusions, LVEDP elevated on cath, we will arrange for HD today. Echo for evaluation of systolic and diastolic function is pending.  3. Pneumonia - CT scan of her chest showing pneumonia, now spiking fevers, blood cultures were sent, on iv antibiotics  4. HTN - controlled  5. ESRD on HD - unfinished HD yesterday, now showing signs of fluid overload we will arrange for HD today  6. Hyperlipidemia - on high dose atorvastatin   Signed, Ena Dawley, H MD, [facility] 05/29/2013

## 2013-05-29 NOTE — Progress Notes (Signed)
[facility] Progress Note    Subjective: Off bipap, feels "lousy" . Coughing with blood-tinged sputum. 2.8kg off w HD yest, one low BP episode into 70's. CXR looks worse today   Exam  Blood pressure 163/56, pulse 99, temperature 102.6 F (39.2 C), temperature source Oral, resp. rate 36, weight 78.7 kg (173 lb 8 oz), SpO2 94.00%. gen: on nasal cannula, looks better than yesterday  neck: + jvd, no bruit  chest: coarse insp bilat rales 1/2 up cor: regular, no M or rub abd: soft, mod obese, nt, nd, +bs  ext: no LE edema bilat neuro: alert, ox3, nonfocal  access: left arm AVF patent  Dialysis: [facility] TTS  Dry wt 78 kg per patient   Assessment/Plan:                                                                       1. Resp distress / pulm infiltrates / fever: cxr worse today but pt better; + high fever and CT appearance of focal PNA with air bronchograms. Pt below dry wt and not vol overloaded on exam, but did have high LVEDP yest (30), prior to dialysis. Plan HD again today , remove fluid as tolerated 2. Chest pain / acute NSTEMI sp stent to RCA 12/20 3. ESRD: [place]. HD today off schedule 4. Anemia / CKD: Hb 8.4, get records, consider epo/esa 5. MBD / CKD: Ca/Phos pending, get records 6. HTN/volume: cont cozaar and coreg 7. Hx DM / retinopathy 8. Hx COPD, former smoker   Kelly Splinter MD  pager [phone]    cell [phone]  05/29/2013, 6:56 AM   Recent Labs Lab 05/28/13 1742 05/29/13 0500  NA 130* 140  K 4.7 4.0  CL 91* 99  CO2 26 31  GLUCOSE 222* 155*  BUN 39* 17  CREATININE 8.23* 4.33*  CALCIUM 7.4* 7.7*  PHOS 5.2*  --     Recent Labs Lab 05/28/13 1742 05/29/13 0500  AST  --  43*  ALT  --  18  ALKPHOS  --  66  BILITOT  --  0.3  PROT  --  5.8*  ALBUMIN 2.2* 2.2*    Recent Labs Lab 05/28/13 1742 05/29/13 0500  WBC 13.0* 9.9  NEUTROABS  --  9.3*  HGB 8.4* 7.5*  HCT 25.2* 23.6*  MCV 102.0* 104.0*  PLT 144* 139*    . aspirin  81 mg Oral Daily  . atorvastatin  80 mg Oral q1800  . carvedilol  6.25 mg Oral BID AC  . cefTRIAXone (ROCEPHIN)  IV  1 g Intravenous Q24H  . clopidogrel  75 mg Oral Q breakfast  . fluticasone  1 spray Each Nare Daily  . furosemide  80 mg Intravenous Once  . heparin  5,000 Units Subcutaneous Q8H  . insulin aspart  0-15 Units Subcutaneous TID WC  . insulin detemir  20 Units Subcutaneous QHS  . levofloxacin (LEVAQUIN) IV  750 mg Intravenous Q48H  . pantoprazole (PROTONIX) IV  40 mg Intravenous Q24H  . pregabalin  50 mg Oral Daily  . promethazine  12.5 mg Intravenous Once  . sertraline  50 mg Oral Daily  . sodium chloride  3 mL Intravenous Q12H  . trimethoprim-polymyxin b  1  drop Both Eyes Q4H   . sodium chloride 20 mL/hr (05/29/13 0023)   sodium chloride, sodium chloride, sodium chloride, acetaminophen, acetaminophen-codeine, albuterol, feeding supplement (NEPRO CARB STEADY), heparin, heparin, labetalol, lidocaine (PF), lidocaine-prilocaine, morphine injection, ondansetron (ZOFRAN) IV, pentafluoroprop-tetrafluoroeth, promethazine, sodium chloride

## 2013-05-29 NOTE — Progress Notes (Signed)
Utilization Review Completed.Donne Anon T12/21/2014

## 2013-05-29 NOTE — Progress Notes (Signed)
BiPAP not required at this time- HR 91, Sp02 100%, RR 22- no distress at this time.

## 2013-05-29 NOTE — Procedures (Signed)
I was present at this dialysis session, have reviewed the session itself and made  appropriate changes  Kelly Splinter MD (pgr) [phone]    [phone] 05/28/2013, 6:02 PM

## 2013-05-29 NOTE — Procedures (Signed)
I was present at this dialysis session, have reviewed the session itself and made  appropriate changes  Kelly Splinter MD (pgr) [phone]    ([phone] 05/29/2013, 1:05 PM

## 2013-05-29 NOTE — Progress Notes (Signed)
PT does not appear to be in respiratory distress at this time. Current vitals= HR 78, Sp02 100% on 4 lpm [place], RR19, BP 153/53, BBS Clear, diminished. PT does not require BiPAP at this time.

## 2013-05-29 NOTE — Progress Notes (Signed)
Name: Theresa [name] MRN: [id number] DOB: 1955-09-10    LOS: 1  Referring Provider:  Dr. Christ Kick  Reason for Referral:  Respiratory distress  PULMONARY / CRITICAL CARE MEDICINE   Brief patient description:  57 y/o with NSTEMI s/p stent to RCA now with nausea, vomiting and worsening respiratory distress/pulmonary infiltrates  Events Since Admission: 12/20 Adm to Cards, positive markers. Cardiac cath, DES to RCA 12/20 acute resp distress/pulmonary infiltrates. NPPV initiated 12/20 Urgent HD performed 12/21 improved resp status. Persistent pulmonary infiltrates. HD performed  SUBJ:  NAD on [place] O2  Vital Signs: Temp:  [98.1 F (36.7 C)-102.6 F (39.2 C)] 98.7 F (37.1 C) (12/21 1530) Pulse Rate:  [74-115] 90 (12/21 1530) Resp:  [12-39] 23 (12/21 1530) BP: (77-212)/(33-105) 132/69 mmHg (12/21 1530) SpO2:  [93 %-100 %] 100 % (12/21 1530) Weight:  [75.7 kg (166 lb 14.2 oz)-78.7 kg (173 lb 8 oz)] 75.7 kg (166 lb 14.2 oz) (12/21 1156)  Physical Examination: General: NAD Neuro:  No focal deficits HEENT: WNL Cardiovascular: RRR s M Lungs: Clear anteriorly Abdomen:  NTND, +BS Ext: no edema, warm   I have reviewed all of today's lab results. Relevant abnormalities are discussed in the A/P section  CXR:  pulmonary edema pattern   Active Problems:   Acute myocardial infarction of other inferior wall, initial episode of care   Acute respiratory failure   Pulmonary infiltrates on CXR   ASSESSMENT AND PLAN  PULMONARY A:  Acute hypoxic resp failure Suspect pulmonary edema (vs. CAP) P:   Cont supplemental O2 Cont PRN NPPV Recheck CXR AM 12/22 - expect clearance of edema   CARDIOVASCULAR A: NSTEMI P:  Mgmt per Cards  RENAL A:  ESRD P:   Renal managing HD Monitor BMET intermittently Correct electrolytes as indicated   GASTROINTESTINAL A:  Nausea and vomiting, resolved P:   Advance diet as tolerated Cont PRN anti-emetics  HEMATOLOGIC A:  Anemia without acute  blood loss P:  Monitor CBC intermittently  INFECTIOUS A:  Possible CAP P:   Cont ceftriaxone/levofloxacin  Check PCT AM 12/22 Consider DC abx if PCT low and CXR clearing     Merton Border, M.D. Pulmonary and [place] Pager: [phone]  05/29/2013, 3:57 PM

## 2013-05-29 NOTE — Progress Notes (Signed)
Dr.Aitsebaomo notified of patients' complaints of back pain, ST with noted ECG changes, B/P elevated. Have medicated with Zofran, CCM in the room and Dr.Aitsebaolmo noted ECG in the system. Will be in soon

## 2013-05-29 NOTE — Consult Note (Signed)
Name: MARNAE CHILTON MRN: [id number] DOB: [birth year]    LOS: 1  Referring Provider:  Dr. Christ Kick  Reason for Referral:  Respiratory distress  PULMONARY / CRITICAL CARE MEDICINE  HPI:  Ms. Lingg is a 57 y/o woman with past medical history of ESRD and COPD who presented with back pain and was found to have elevated troponins and an abnormal EKD.  She was taken to the cath lab and had a stent placed to her RCA.  She was then taken to dialysis where after 2L of fluid removal she became hypotensive.  After transfer to the CICU she developed nausea, vomiting, hypoxia and hypertension.  PCCM was then called to assess the patient.  Of note she is continuing to have ST depression in the anterior leads on EKG.  She does report that she has been coughing at home for the last few days and may have had some fever.   Past Medical History  Diagnosis Date  . Hypertension   . ESRD on hemodialysis     Pt gets dialysis with [facility] in [place], [place], on a TTS schedule.  She started dialysis in Feb 2014.  Etiology of renal failure not known, likely diabetes.  Has a left upper arm AV graft as of Dec 2014.    Marland Kitchen COPD (chronic obstructive pulmonary disease)   . Emphysema   . Pneumonia     June 2012  . Bronchitis     Mar 2012  . Peripheral vascular disease   . Chronic constipation   . Colon polyps   . Sickle cell trait   . Anemia   . Diabetes mellitus   . Heart murmur     Dr. Cornelius Moras  . Urinary tract infection     hx of  . Diabetic neuropathy   . Diabetic retinopathy 05/28/2013    Hx bilat retinal detachment, proliferative diab retinopathy and bilat vitreous hemorrhage   . Anemia in chronic kidney disease (CKD) 02/18/2012  . Hyperlipidemia    Past Surgical History  Procedure Laterality Date  . Abdominal hysterectomy      2000  . Dilation and curettage of uterus      several in the early 80's  . Eye surgery      bilateral laser 2012  . Esophagogastroduodenoscopy      2012  . Colonscopy    . Tubal  ligation      1979  . Pars plana vitrectomy  04/22/2011    Procedure: PARS PLANA VITRECTOMY WITH 25 GAUGE;  Surgeon: [name], MD;  Location: [facility];  Service: Ophthalmology;  Laterality: Left;  membrane peel, endolaser, gas fluid exchange, silicone oil, repair of complex traction retinal detachment  . Pars plana vitrectomy  09/30/2011    Procedure: PARS PLANA VITRECTOMY WITH 25 GAUGE;  Surgeon: [name], MD;  Location: [facility];  Service: Ophthalmology;  Laterality: Right;  Endolaser; Repair of Complex Traction Retinal Detachment  . Gas/fluid exchange  09/30/2011    Procedure: GAS/FLUID EXCHANGE;  Surgeon: [name], MD;  Location: [facility];  Service: Ophthalmology;  Laterality: Right;  . Gas insertion  09/30/2011    Procedure: INSERTION OF GAS;  Surgeon: [name], MD;  Location: [facility];  Service: Ophthalmology;  Laterality: Right;  [id number]  . Eye surgery      right  . Pars plana vitrectomy  02/24/2012    Procedure: PARS PLANA VITRECTOMY WITH 25 GAUGE;  Surgeon: [name], MD;  Location: [facility];  Service: Ophthalmology;  Laterality: Left;  . Silicon oil removal  [id number]    Procedure: SILICON OIL REMOVAL;  Surgeon: [name], MD;  Location: [facility];  Service: Ophthalmology;  Laterality: Left;  . Ptca    . Thrombectomy / arteriovenous graft revision     Prior to Admission medications   Medication Sig Start Date End Date Taking? Authorizing Provider  aspirin EC 81 MG tablet Take 81 mg by mouth daily.   Yes Historical Provider, MD  carvedilol (COREG) 6.25 MG tablet Take 1 tablet (6.25 mg total) by mouth 2 (two) times daily. 01/21/13  Yes Jackolyn Confer, MD  furosemide (LASIX) 40 MG tablet Take 1 tablet (40 mg total) by mouth daily. 03/19/12  Yes Jackolyn Confer, MD  losartan (COZAAR) 25 MG tablet Take 4 tablets (100 mg total) by mouth daily. 01/21/13  Yes Jackolyn Confer, MD  acetaminophen-codeine (TYLENOL #3) 300-30 MG per tablet Take 1 tablet by mouth every 4  (four) hours as needed.  04/15/13   Historical Provider, MD  albuterol (PROAIR HFA) 108 (90 BASE) MCG/ACT inhaler Inhale 1 puff into the lungs every 4 (four) hours as needed.     Historical Provider, MD  Insulin Detemir 100 UNIT/ML SOPN Inject 20 Units into the skin daily. 04/19/13   Jackolyn Confer, MD  Insulin Pen Needle 32G X 6 MM MISC 1 Units by Does not apply route as needed. 11/09/12   Jackolyn Confer, MD  mometasone (NASONEX) 50 MCG/ACT nasal spray Place 2 sprays into the nose daily. 2 sprays by Each Nare route daily as needed.    Historical Provider, MD  pregabalin (LYRICA) 50 MG capsule Take 50 mg by mouth daily.    Historical Provider, MD  rosuvastatin (CRESTOR) 40 MG tablet Take 1 tablet (40 mg total) by mouth daily. 11/12/12   Jackolyn Confer, MD  sertraline (ZOLOFT) 50 MG tablet Take 1 tablet (50 mg total) by mouth daily. 01/31/13   Jackolyn Confer, MD  trimethoprim-polymyxin b (POLYTRIM) ophthalmic solution Place 1 drop into both eyes every 4 (four) hours. 03/28/13   Charolette Forward, NP   Allergies Allergies  Allergen Reactions  . Hydrocodone     Family History Family History  Problem Relation Age of Onset  . Anesthesia problems Neg Hx   . Hypotension Neg Hx   . Malignant hyperthermia Neg Hx   . Pseudochol deficiency Neg Hx   . Stroke Mother   . Heart attack Mother   . Heart disease Mother   . Colon cancer Father   . Colon cancer Sister   . Heart attack Brother   . Stroke Brother   . Diabetes Brother   . Breast cancer Sister   . Diabetes Brother   . Diabetes Daughter    Social History  reports that she quit smoking about 2 years ago. She does not have any smokeless tobacco history on file. She reports that she does not drink alcohol or use illicit drugs.  Review Of Systems:  A review of 14 systems was negative except as stated in the HPI.   Brief patient description:  57 y/o with NSTEMI s/p stent to RCA now with nausea, vomiting and worsening respiratory  distress  Events Since Admission: To cath lab RCA stent 12/20 Dialysis 12/20  Current Status:  Vital Signs: Temp:  [98.6 F (37 C)-99.9 F (37.7 C)] 98.6 F (37 C) (12/20 2000) Pulse Rate:  [77-111] 111 (12/20 2302) Resp:  [12-37] 26 (12/20  2302) BP: (77-202)/(16-66) 202/62 mmHg (12/20 2200) SpO2:  [98 %-100 %] 100 % (12/20 2302) Weight:  [78.7 kg (173 lb 8 oz)] 78.7 kg (173 lb 8 oz) (12/20 1739)  Physical Examination: General:  Sitting up in bed, tachypneic but able to speak in 4-5 word sentences Neuro:  Altert and oriented x3 HEENT:  PERRL, EOMI, OP clear Neck:  No masses Cardiovascular:  Tachycardic, regular rhythm, no MRG Lungs:  Bilateral posterior crackles Abdomen:  NTND, +BS Musculoskeletal:  Dialysis fistula left arm, no other joint abnormalities Skin:  No lesions  Active Problems:   Acute myocardial infarction of other inferior wall, initial episode of care   Acute respiratory failure   Pulmonary infiltrates on CXR   ASSESSMENT AND PLAN  PULMONARY No results found for this basename: PHART, PCO2, PCO2ART, PO2ART, HCO3, O2SAT,  in the last 168 hours Ventilator Settings:   CXR:  Bilateral infiltrates, pulmonary edema vs. Pneumonia vs. both   A:  Pulmonary edema vs. CAP P:   On levaquin for CAP - agree would continue Aggressive management of blood pressure to avoid worsening pulmonary edema Morphine for air hunger Currently on 4L [place] Would avoid BiPap while nauseated due to increased risk of aspiration.  CARDIOVASCULAR  Recent Labs Lab 05/28/13 1742  PROBNP >70000.0*   ECG:  ST depressions in I, II, V3-5 with reciprocal changes in AVR   A: NSTEMI P:  S/p stent to RCA Cardiology will come re-evaluate  RENAL  Recent Labs Lab 05/28/13 1742  NA 130*  K 4.7  CL 91*  CO2 26  BUN 39*  CREATININE 8.23*  CALCIUM 7.4*  PHOS 5.2*   Intake/Output     12/20 0701 - 12/21 0700   P.O. 60   I.V. (mL/kg) 60 (0.8)   IV Piggyback 50   Total  Intake(mL/kg) 170 (2.2)   Other 2700   Total Output 2700   Net -2530          A:  ESRD P:   S/p dialysis today, fluid revomval goal was 4L, only able to take off 2 due to hypotension, had to give some fluid back Will likely need dialysis again tomorrow for fluid removal given persistent pulmonary edema  GASTROINTESTINAL  Recent Labs Lab 05/28/13 1742  ALBUMIN 2.2*    A:  Nausea and vomiting P:   Appears to respond better to phenergan than to zofran  HEMATOLOGIC  Recent Labs Lab 05/28/13 1742  HGB 8.4*  HCT 25.2*  PLT 144*   A:  Anemia P:  Likely due to ESRD  INFECTIOUS  Recent Labs Lab 05/28/13 1742  WBC 13.0*   Cultures: sputum Antibiotics: levaquin  A:  Possible CAP P:   Agree with empiric treatment of CAP with levaquin    BEST PRACTICE / DISPOSITION Level of Care:  ICU Primary Service:  Cardiology Consultants:  PCCM Code Status:  full Diet:  clears DVT Px:  heparin GI Px:  Not currently indicated Skin Integrity:  good Social / Family:  Granddaughter aware of hospitalization but not at bedside.   I spent 65 minutes of critical care time in the care of this patient seperate from procedures which are documented elsewhere   Lincoln Maxin, Mariann Laster., M.D. Pulmonary and [place] Pager: [phone]  05/29/2013, 12:11 AM

## 2013-05-30 ENCOUNTER — Inpatient Hospital Stay (HOSPITAL_COMMUNITY): Payer: BC Managed Care – PPO

## 2013-05-30 ENCOUNTER — Ambulatory Visit: Payer: BC Managed Care – PPO | Admitting: Internal Medicine

## 2013-05-30 DIAGNOSIS — I509 Heart failure, unspecified: Secondary | ICD-10-CM

## 2013-05-30 DIAGNOSIS — I214 Non-ST elevation (NSTEMI) myocardial infarction: Secondary | ICD-10-CM

## 2013-05-30 DIAGNOSIS — N185 Chronic kidney disease, stage 5: Secondary | ICD-10-CM

## 2013-05-30 DIAGNOSIS — I059 Rheumatic mitral valve disease, unspecified: Secondary | ICD-10-CM

## 2013-05-30 LAB — POCT I-STAT, CHEM 8
BUN: 33 mg/dL — ABNORMAL HIGH (ref 6–23)
Calcium, Ion: 0.99 mmol/L — ABNORMAL LOW (ref 1.12–1.23)
Chloride: 94 mEq/L — ABNORMAL LOW (ref 96–112)
Creatinine, Ser: 7.8 mg/dL — ABNORMAL HIGH (ref 0.50–1.10)
Glucose, Bld: 225 mg/dL — ABNORMAL HIGH (ref 70–99)
HCT: 31 % — ABNORMAL LOW (ref 36.0–46.0)
TCO2: 25 mmol/L (ref 0–100)

## 2013-05-30 LAB — GLUCOSE, CAPILLARY
Glucose-Capillary: 127 mg/dL — ABNORMAL HIGH (ref 70–99)
Glucose-Capillary: 58 mg/dL — ABNORMAL LOW (ref 70–99)
Glucose-Capillary: 97 mg/dL (ref 70–99)
Glucose-Capillary: 102 mg/dL — ABNORMAL HIGH (ref 70–99)

## 2013-05-30 LAB — BASIC METABOLIC PANEL
CO2: 31 mEq/L (ref 19–32)
Chloride: 97 mEq/L (ref 96–112)
Creatinine, Ser: 3.82 mg/dL — ABNORMAL HIGH (ref 0.50–1.10)
GFR calc non Af Amer: 12 mL/min — ABNORMAL LOW (ref >90)
Glucose, Bld: 58 mg/dL — ABNORMAL LOW (ref 70–99)
Potassium: 3.9 mEq/L (ref 3.5–5.1)
Sodium: 136 mEq/L (ref 135–145)

## 2013-05-30 LAB — CBC
HCT: 23.4 % — ABNORMAL LOW (ref 36.0–46.0)
Hemoglobin: 7.5 g/dL — ABNORMAL LOW (ref 12.0–15.0)
MCV: 105.4 fL — ABNORMAL HIGH (ref 78.0–100.0)
RBC: 2.22 MIL/uL — ABNORMAL LOW (ref 3.87–5.11)
RDW: 16.1 % — ABNORMAL HIGH (ref 11.5–15.5)
WBC: 7.7 10*3/uL (ref 4.0–10.5)

## 2013-05-30 LAB — PROCALCITONIN: Procalcitonin: 25.31 ng/mL

## 2013-05-30 MED ORDER — CARVEDILOL 12.5 MG PO TABS
12.5000 mg | ORAL_TABLET | Freq: Two times a day (BID) | ORAL | Status: DC
  Administered 2013-05-30 – 2013-06-03 (×7): 12.5 mg via ORAL
  Filled 2013-05-30 (×10): qty 1

## 2013-05-30 MED ORDER — DARBEPOETIN ALFA-POLYSORBATE 150 MCG/0.3ML IJ SOLN
150.0000 ug | INTRAMUSCULAR | Status: DC
  Administered 2013-05-31: 150 ug via INTRAVENOUS
  Filled 2013-05-30: qty 0.3

## 2013-05-30 MED ORDER — INSULIN DETEMIR 100 UNIT/ML ~~LOC~~ SOLN
15.0000 [IU] | Freq: Every day | SUBCUTANEOUS | Status: DC
  Filled 2013-05-30: qty 0.15

## 2013-05-30 MED ORDER — LEVOFLOXACIN 500 MG PO TABS
500.0000 mg | ORAL_TABLET | ORAL | Status: DC
  Administered 2013-05-30 – 2013-06-01 (×2): 500 mg via ORAL
  Filled 2013-05-30 (×3): qty 1

## 2013-05-30 MED ORDER — DEXTROSE 50 % IV SOLN
INTRAVENOUS | Status: AC
  Filled 2013-05-30: qty 50

## 2013-05-30 MED ORDER — DEXTROSE 50 % IV SOLN
50.0000 mL | Freq: Once | INTRAVENOUS | Status: AC | PRN
  Administered 2013-05-30: 25 mL via INTRAVENOUS

## 2013-05-30 MED ORDER — PANTOPRAZOLE SODIUM 40 MG PO TBEC
40.0000 mg | DELAYED_RELEASE_TABLET | Freq: Every day | ORAL | Status: DC
  Administered 2013-05-30 – 2013-06-03 (×5): 40 mg via ORAL
  Filled 2013-05-30 (×4): qty 1

## 2013-05-30 MED ORDER — CALCIUM ACETATE 667 MG PO CAPS
1334.0000 mg | ORAL_CAPSULE | Freq: Three times a day (TID) | ORAL | Status: DC
  Administered 2013-05-31 – 2013-06-03 (×8): 1334 mg via ORAL
  Filled 2013-05-30 (×15): qty 2

## 2013-05-30 MED ORDER — INSULIN DETEMIR 100 UNIT/ML ~~LOC~~ SOLN
8.0000 [IU] | Freq: Every day | SUBCUTANEOUS | Status: DC
  Administered 2013-05-31 – 2013-06-02 (×3): 8 [IU] via SUBCUTANEOUS
  Filled 2013-05-30 (×5): qty 0.08

## 2013-05-30 NOTE — Progress Notes (Signed)
LOS: 2  Referring Provider:  Dr. Christ Kick  Reason for Referral:  Respiratory distress   Brief patient description:  57 y/o with NSTEMI s/p stent to RCA now with nausea, vomiting and worsening respiratory distress/pulmonary infiltrates  Culture data 12/20 BCX2>>> 12/21 BCX2>>> 12/21: MRSA PCR: positive  Antibiotics Ceftriaxone 12/20>>> levaquin 12/20>>  Events Since Admission: 12/20 Adm to Cards, positive markers. Cardiac cath, DES to RCA 12/20 acute resp distress/pulmonary infiltrates. NPPV initiated 12/20 Urgent HD performed 12/21 improved resp status. Persistent pulmonary infiltrates. HD performed  SUBJ:  Feeling better   Vital Signs: Temp:  [98.1 F (36.7 C)-99.8 F (37.7 C)] 98.3 F (36.8 C) (12/22 0800) Pulse Rate:  [71-94] 84 (12/22 0800) Resp:  [16-36] 28 (12/22 0800) BP: (93-172)/(33-105) 172/61 mmHg (12/22 0800) SpO2:  [98 %-100 %] 100 % (12/22 0800) Weight:  [73.5 kg (162 lb 0.6 oz)-75.7 kg (166 lb 14.2 oz)] 73.5 kg (162 lb 0.6 oz) (12/22 0300) 3 liters Physical Examination: General: NAD Neuro:  No focal deficits HEENT: WNL Cardiovascular: RRR s M Lungs: scattered rales no accessory muscle use Abdomen:  NTND, +BS Ext: no edema, warm left AVG: good bruit/thirll  CBC Recent Labs     05/28/13  1742  05/29/13  0500  05/30/13  0245  WBC  13.0*  9.9  7.7  HGB  8.4*  7.5*  7.5*  HCT  25.2*  23.6*  23.4*  PLT  144*  139*  139*    Coag's No results found for this basename: APTT, INR,  in the last 72 hours  BMET Recent Labs     05/28/13  1742  05/29/13  0500  05/30/13  0245  NA  130*  140  136  K  4.7  4.0  3.9  CL  91*  99  97  CO2  26  31  31   BUN  39*  17  15  CREATININE  8.23*  4.33*  3.82*  GLUCOSE  222*  155*  58*    Electrolytes Recent Labs     05/28/13  1742  05/29/13  0500  05/30/13  0245  CALCIUM  7.4*  7.7*  8.0*  PHOS  5.2*   --    --     Sepsis Markers Recent Labs     05/30/13  0245  PROCALCITON  25.31     ABG Recent Labs     05/29/13  0403  PHART  7.442  PCO2ART  46.4*  PO2ART  59.0*    Liver Enzymes Recent Labs     05/28/13  1742  05/29/13  0500  AST   --   43*  ALT   --   18  ALKPHOS   --   66  BILITOT   --   0.3  ALBUMIN  2.2*  2.2*    Cardiac Enzymes Recent Labs     05/28/13  1742  05/29/13  0430  TROPONINI   --   13.58*  PROBNP  >70000.0*   --     Glucose Recent Labs     05/29/13  0752  05/29/13  1644  05/29/13  2144  05/29/13  2205  05/29/13  2300  05/30/13  0828  GLUCAP  119*  92  60*  79  146*  99    Imaging Dg Chest Port 1 View  05/30/2013   CLINICAL DATA:  Respiratory failure.  EXAM: PORTABLE CHEST - 1 VIEW  COMPARISON:  May 29, 2013.  FINDINGS: The  pulmonary interstitial markings remain increased and are confluent in areas. The hemidiaphragms are better demonstrated today. The cardiopericardial silhouette rim of is remains mildly enlarged and the pulmonary vascularity is engorged. No significant pleural effusion is demonstrated today. There is no pneumothorax. The mediastinum is normal in width.  IMPRESSION: There has been slight interval improvement in the appearance of the pulmonary interstitium. At both lung bases the suspected subsegmental atelectasis has largely cleared.   Electronically Signed   By: David  Martinique   On: 05/30/2013 07:34   Dg Chest Port 1 View  05/29/2013   CLINICAL DATA:  Expiratory distress  EXAM: PORTABLE CHEST - 1 VIEW  COMPARISON:  Chest x-ray from 1 day prior  FINDINGS: Increasingly dense lower lung opacities. Where not obscured, the cardiomediastinal silhouette is similar in size and morphology. The opacities have become more confluent and symmetric, favoring edema. No pneumothorax.  IMPRESSION: Increasingly confluent bilateral opacities, which favor edema over pneumonia.   Electronically Signed   By: Jorje Guild M.D.   On: 05/29/2013 00:35   Dg Chest Port 1 View  05/28/2013   CLINICAL DATA:  Assess edema. Rule  out pneumonia. Dialysis patient.  EXAM: PORTABLE CHEST - 1 VIEW  COMPARISON:  01/13/2012  FINDINGS: There is bilateral airspace disease which is somewhat patchy in the upper lung stones, especially on the left. The shape of the left diaphragm suggest a sub pulmonic effusion. Normal heart size for technique. No pneumothorax.  IMPRESSION: 1. Bilateral airspace disease which could be noncardiogenic edema or pneumonia. Moderate symmetry and dialysis history favors edema. 2. Question small subpulmonic effusion on the left.   Electronically Signed   By: Jorje Guild M.D.   On: 05/28/2013 22:28   12/22: some improvement in aeration but still some areas of patchy bilateral air space disease persists.    Principal Problem:   NSTEMI (non-ST elevated myocardial infarction) Active Problems:   COPD (chronic obstructive pulmonary disease)   Diabetes mellitus, type II   ESRD on hemodialysis   Diabetic neuropathy   Anemia in chronic kidney disease (CKD)   Hypertension   Hyperlipidemia   Diabetic retinopathy   Acute respiratory failure   Pulmonary infiltrates on CXR   Acute CHF   ASSESSMENT AND PLAN  PULMONARY A:   Acute hypoxic resp failure Suspect pulmonary edema super-imposed on CAP (NOS) P:   Cont supplemental O2 D/c BIPAP F/u CXR 48 hrs   CARDIOVASCULAR A: NSTEMI P:  Mgmt per Cards  RENAL A:   ESRD P:   Renal managing HD Monitor BMET intermittently Correct electrolytes as indicated   GASTROINTESTINAL A:   Nausea and vomiting, resolved P:   Advance diet as tolerated Cont PRN anti-emetics  HEMATOLOGIC A:   Anemia without acute blood loss >dilutional hgb drift 12/22 P:  Monitor CBC intermittently  INFECTIOUS A:  Possible CAP P:   Dc ceftraixone, remains culture neg Cont levofloxacin complete 7 days Trend PCT (plan 7- course assuming PCT trending down)  OK to transfer to tele from PCCM stand-point.   BABCOCK,PETE 05/30/2013, 10:03 AM  I have fully examined  this patient and agree with above findings.    Will sign off call if needed See abx recs  Lavon Paganini. Titus Mould, MD, [place] Pgr: [facility]

## 2013-05-30 NOTE — Clinical Documentation Improvement (Signed)
THIS DOCUMENT IS NOT A PERMANENT PART OF THE MEDICAL RECORD  Please update your documentation with the medical record to reflect your response to this query. If you need help knowing how to do this please call [phone].  05/30/13  Dear Dr. Alva Garnet:  Chart Notes give "pulmonary edema" and "CHF". Please clarify the acuity of this diagnosis to illustrate this patient's severity of illness and risk of mortality. Thank you.  Acute Systolic Congestive Heart Failure Acute Diastolic Congestive Heart Failure Acute Systolic & Diastolic Congestive Heart Failure Acute on Chronic Systolic Congestive Heart Failure Acute on Chronic Diastolic Congestive Heart Failure Acute on Chronic Systolic & Diastolic  Congestive Heart Failure Other Condition (please specify)  Supporting Information: - Acute NSTEMI - ESRD on HD with 4L fluid removed  Reviewed:  no additional documentation provided  Thank You,  [place] Documentation Specialist: [place]

## 2013-05-30 NOTE — Progress Notes (Signed)
CARDIAC REHAB PHASE I   PRE:  Rate/Rhythm: 87 SR  BP:  Supine: 157/54  Sitting:   Standing:    SaO2: 100 3L  MODE:  Ambulation: to recliner    POST:  Rate/Rhythm:   BP:  Supine:   Sitting:   Standing:   SaO2:  1355-1430 On arrival pt sleeping in bed. RN states that she has been nauseated and not feeling good this am. Pt awakened she c/o of nausea. RN to check blood sugar 58. Pt denies any symptoms of hypoglycemia. Pt ask to sit up. We assisted her to recliner, recheck on blood sugar 127. Pt stated that she was going to try to eat. RN with pt and call light in reach. We will follow pt tomorrow.  Rodney Langton RN 05/30/2013 1:30 PM

## 2013-05-30 NOTE — Progress Notes (Signed)
Patient Name: Theresa [name] Date of Encounter: 05/30/2013   Principal Problem:   NSTEMI (non-ST elevated myocardial infarction) Active Problems:   Acute respiratory failure   Acute CHF   Diabetes mellitus, type II   ESRD on hemodialysis   Hypertension   Pulmonary infiltrates on CXR   COPD (chronic obstructive pulmonary disease)   Diabetic neuropathy   Anemia in chronic kidney disease (CKD)   Hyperlipidemia   Diabetic retinopathy   SUBJECTIVE  Breathing much improved.  Overall feeling better.  She has had a productive cough and in that setting has also had pleuritic/chest wall pain.  No chest pain in absence of coughing.  CURRENT MEDS . aspirin  81 mg Oral Daily  . atorvastatin  80 mg Oral q1800  . carvedilol  6.25 mg Oral BID AC  . cefTRIAXone (ROCEPHIN)  IV  1 g Intravenous Q24H  . clopidogrel  75 mg Oral Q breakfast  . fluticasone  1 spray Each Nare Daily  . heparin  5,000 Units Subcutaneous Q8H  . insulin aspart  0-15 Units Subcutaneous TID WC  . insulin detemir  20 Units Subcutaneous QHS  . levofloxacin (LEVAQUIN) IV  500 mg Intravenous Q48H  . pantoprazole (PROTONIX) IV  40 mg Intravenous Q24H  . pregabalin  50 mg Oral Daily  . sertraline  50 mg Oral Daily  . sodium chloride  3 mL Intravenous Q12H  . trimethoprim-polymyxin b  1 drop Both Eyes Q4H    OBJECTIVE  Filed Vitals:   05/30/13 0400 05/30/13 0500 05/30/13 0600 05/30/13 0700  BP: 142/51 150/50 139/46 151/50  Pulse: 73 76 77 77  Temp:      TempSrc:      Resp: 18 20 20 22   Weight:      SpO2: 100% 100% 100% 100%    Intake/Output Summary (Last 24 hours) at 05/30/13 0837 Last data filed at 05/30/13 0300  Gross per 24 hour  Intake    390 ml  Output   2950 ml  Net  -2560 ml   Filed Weights   05/29/13 1156 05/29/13 1542 05/30/13 0300  Weight: 166 lb 14.2 oz (75.7 kg) 162 lb 11.2 oz (73.8 kg) 162 lb 0.6 oz (73.5 kg)    PHYSICAL EXAM  General: Pleasant, NAD. Neuro: Alert and oriented X 3. Moves  all extremities spontaneously. Psych: Normal affect. HEENT:  Normal  Neck: Supple without bruits.  Mildly elevated JVP. Lungs:  Resp regular and unlabored, scatt rhonchi throughout, diminished breath sounds bilat bases, L>R. Heart: RRR no s3, s4, or murmurs. Abdomen: firm, ruq tenderness, BS + x 4.  Extremities: No clubbing, cyanosis or edema. DP/PT/Radials 2+ and equal bilaterally.  Accessory Clinical Findings  CBC  Recent Labs  05/29/13 0500 05/30/13 0245  WBC 9.9 7.7  NEUTROABS 9.3*  --   HGB 7.5* 7.5*  HCT 23.6* 23.4*  MCV 104.0* 105.4*  PLT 139* [id number]*   Basic Metabolic Panel  Recent Labs  05/28/13 1742 05/29/13 0500 05/30/13 0245  NA 130* 140 136  K 4.7 4.0 3.9  CL 91* 99 97  CO2 26 31 31   GLUCOSE 222* 155* 58*  BUN 39* 17 15  CREATININE 8.23* 4.33* 3.82*  CALCIUM 7.4* 7.7* 8.0*  PHOS 5.2*  --   --    Liver Function Tests  Recent Labs  05/28/13 1742 05/29/13 0500  AST  --  43*  ALT  --  18  ALKPHOS  --  66  BILITOT  --  0.3  PROT  --  5.8*  ALBUMIN 2.2* 2.2*   Cardiac Enzymes  Recent Labs  05/29/13 0430  TROPONINI 13.58*   TELE  rsr  Radiology/Studies  Dg Chest Port 1 View  05/30/2013   CLINICAL DATA:  Respiratory failure.  EXAM: PORTABLE CHEST - 1 VIEW  COMPARISON:  May 29, 2013.  FINDINGS: The pulmonary interstitial markings remain increased and are confluent in areas. The hemidiaphragms are better demonstrated today. The cardiopericardial silhouette rim of is remains mildly enlarged and the pulmonary vascularity is engorged. No significant pleural effusion is demonstrated today. There is no pneumothorax. The mediastinum is normal in width.  IMPRESSION: There has been slight interval improvement in the appearance of the pulmonary interstitium. At both lung bases the suspected subsegmental atelectasis has largely cleared.   Electronically Signed   By: David  Martinique   On: 05/30/2013 07:34   ASSESSMENT AND PLAN  1.  Acute NSTEMI/CAD:   S/p PCI/DES of the RCA on 12/20.  She has some pleuritic chest pain assoc with coughing but otw is w/o angina.  Cont asa, statin, bb, plavix.  Echo today to eval EF (EDP 30 on cath).  Cardiac rehab to see.  2.  Acute resp failure/Pulm infiltrates:  Low grade fever overnight.  Productive cough with pleuritic chest pain.  Coarse breath sounds throughout.  Abx per pulmonology.  Currently off bipap and oxygenating well on [place].    3.  Acute CHF:  EDP 30 during cath.  Volume mgmt per nephrology (T, R, S dialysis).  Echo today.  Cont bb.  Consider ACEI if EF reduced.  4.  ESRD:  Per nephrology.  5.  Anemia of chronic disease:  Stable.  6.  HL:  LDL 138 in 04/2011.  Repeat in AM.  LFT's ok.  7.  DM: Cont ssi and levemir.  8.  HTN:  Titrate coreg.  Signed, Murray Hodgkins NP  I have personally seen and examined this patient with Ignacia Bayley, NP. I agree with the assessment and plan as outlined above. She is stable. No angina. Continue current therapy for CAD post stent. Appreciate assistance of Nephrology and PCCM teams. No plans for HD today. She is on antibiotics for possible pneumonia. O2 requirement is less but still has diffuse rhonci on lung exam. Volume management per HD.   MCALHANY,CHRISTOPHER 9:23 AM 05/30/2013

## 2013-05-30 NOTE — Progress Notes (Signed)
Echocardiogram 2D Echocardiogram has been performed.  Theresa [name] 05/30/2013, 12:46 PM

## 2013-05-30 NOTE — Progress Notes (Signed)
Inpatient Diabetes Program Recommendations  AACE/ADA: New Consensus Statement on Inpatient Glycemic Control (2013)  Target Ranges:  Prepandial:   less than 140 mg/dL      Peak postprandial:   less than 180 mg/dL (1-2 hours)      Critically ill patients:  140 - 180 mg/dL  Results for JAEDAH, MCPHERON (MRN [id number]) as of 05/30/2013 12:20  Ref. Range 05/29/2013 16:44 05/29/2013 21:44 05/29/2013 22:05 05/29/2013 23:00 05/30/2013 08:28  Glucose-Capillary Latest Range: 70-99 mg/dL 92 60 (L) 79 146 (H) 99  Results for AKASHIA, BARBARITO (MRN [id number]) as of 05/30/2013 12:20  Ref. Range 05/30/2013 02:45  Glucose No range found 58 (L)   Inpatient Diabetes Program Recommendations Insulin - Basal: decrease Levemir to 15 units  Thank you  Raoul Pitch BSN, RN,CDE Inpatient Diabetes Coordinator [phone] (team pager)

## 2013-05-30 NOTE — Progress Notes (Signed)
[facility] Progress Note    Subjective: Feels better- got another 3 liters off with hd yest, now on [place] o2  Exam  Blood pressure 151/50, pulse 77, temperature 99.2 F (37.3 C), temperature source Oral, resp. rate 22, weight 73.5 kg (162 lb 0.6 oz), SpO2 100.00%. gen: on nasal cannula, looks better neck: + jvd, no bruit  chest: coarse insp bilat rales 1/2 up cor: regular, no M or rub abd: soft, mod obese, nt, nd, +bs  ext: no LE edema bilat neuro: alert, ox3, nonfocal  access: left arm AVG patent  Dialysis:  [facility] - [address] Dry wt 78 kg per patient   Assessment/Plan:                                                                       1. Resp distress / pulm infiltrates / fever: improved with uf below edw and levaquin/rocephin as well as cardiac stenting. 2. Chest pain / acute NSTEMI sp stent to RCA 12/20, per cards 3. ESRD: [place]. HD normally tts via avg which would be mws with holiday schedule- since done sat and sun, will hold today and do tomorrow- will need coordination with op unit at time of discharge 4. Anemia / CKD: Hb 8.4, get records, start aranesp 5. MBD / CKD: Ca 8 /Phos 5.2 pending, on phoslo 2 with meals as op, will resume 6. HTN/volume: on cozaar and coreg as op, on coreg here only- volume control will help as well- may not need cozaar 7. Hx DM / retinopathy 8. Hx COPD, former smoker  Theresa [name] A  MD   05/30/2013, 9:08 AM   Recent Labs Lab 05/28/13 1742 05/29/13 0500 05/30/13 0245  NA 130* 140 136  K 4.7 4.0 3.9  CL 91* 99 97  CO2 26 31 31   GLUCOSE 222* 155* 58*  BUN 39* 17 15  CREATININE 8.23* 4.33* 3.82*  CALCIUM 7.4* 7.7* 8.0*  PHOS 5.2*  --   --     Recent Labs Lab 05/28/13 1742 05/29/13 0500  AST  --  43*  ALT  --  18  ALKPHOS  --  66  BILITOT  --  0.3  PROT  --  5.8*  ALBUMIN 2.2* 2.2*    Recent Labs Lab 05/28/13 1742 05/29/13 0500 05/30/13 0245  WBC 13.0* 9.9 7.7   NEUTROABS  --  9.3*  --   HGB 8.4* 7.5* 7.5*  HCT 25.2* 23.6* 23.4*  MCV 102.0* 104.0* 105.4*  PLT 144* 139* 139*   . aspirin  81 mg Oral Daily  . atorvastatin  80 mg Oral q1800  . carvedilol  12.5 mg Oral BID AC  . cefTRIAXone (ROCEPHIN)  IV  1 g Intravenous Q24H  . clopidogrel  75 mg Oral Q breakfast  . fluticasone  1 spray Each Nare Daily  . heparin  5,000 Units Subcutaneous Q8H  . insulin aspart  0-15 Units Subcutaneous TID WC  . insulin detemir  20 Units Subcutaneous QHS  . levofloxacin (LEVAQUIN) IV  500 mg Intravenous Q48H  . pantoprazole (PROTONIX) IV  40 mg Intravenous Q24H  . pregabalin  50 mg Oral Daily  . sertraline  50 mg Oral Daily  . sodium chloride  3 mL Intravenous Q12H  . trimethoprim-polymyxin b  1 drop Both Eyes Q4H   . sodium chloride Stopped (05/30/13 0300)   sodium chloride, sodium chloride, sodium chloride, sodium chloride, sodium chloride, acetaminophen, acetaminophen-codeine, albuterol, diphenhydrAMINE, feeding supplement (NEPRO CARB STEADY), feeding supplement (NEPRO CARB STEADY), heparin, heparin, heparin, heparin, labetalol, lidocaine (PF), lidocaine (PF), lidocaine-prilocaine, lidocaine-prilocaine, morphine injection, ondansetron (ZOFRAN) IV, pentafluoroprop-tetrafluoroeth pentafluoroprop-tetrafluoroeth, promethazine, sodium chloride

## 2013-05-30 NOTE — Progress Notes (Signed)
Patients blood sugar was 58.  She is alert and oriented and communicating.  Attempted oral juice but pt was unable to take due to nausea.  D50 1/2 amp was given iv per protocol.  Theresa Bayley NP was notified and new orders were obtained to advance the diet and changes were made to the Levimir dose.

## 2013-05-31 DIAGNOSIS — I214 Non-ST elevation (NSTEMI) myocardial infarction: Secondary | ICD-10-CM

## 2013-05-31 LAB — RENAL FUNCTION PANEL
Albumin: 2.3 g/dL — ABNORMAL LOW (ref 3.5–5.2)
CO2: 27 mEq/L (ref 19–32)
Chloride: 95 mEq/L — ABNORMAL LOW (ref 96–112)
Creatinine, Ser: 6.44 mg/dL — ABNORMAL HIGH (ref 0.50–1.10)
GFR calc Af Amer: 8 mL/min — ABNORMAL LOW (ref >90)
GFR calc non Af Amer: 7 mL/min — ABNORMAL LOW (ref >90)
Glucose, Bld: 98 mg/dL (ref 70–99)
Sodium: 134 mEq/L — ABNORMAL LOW (ref 135–145)

## 2013-05-31 LAB — MRSA CULTURE

## 2013-05-31 LAB — CBC
HCT: 20.1 % — ABNORMAL LOW (ref 36.0–46.0)
MCH: 33.2 pg (ref 26.0–34.0)
MCV: 101 fL — ABNORMAL HIGH (ref 78.0–100.0)
Platelets: 135 10*3/uL — ABNORMAL LOW (ref 150–400)
RDW: 15.8 % — ABNORMAL HIGH (ref 11.5–15.5)

## 2013-05-31 LAB — ABO/RH: ABO/RH(D): O POS

## 2013-05-31 LAB — LIPID PANEL
Cholesterol: 118 mg/dL (ref 0–200)
HDL: 50 mg/dL (ref >39)
Total CHOL/HDL Ratio: 2.4 RATIO
VLDL: 26 mg/dL (ref 0–40)

## 2013-05-31 LAB — PREPARE RBC (CROSSMATCH)

## 2013-05-31 LAB — GLUCOSE, CAPILLARY
Glucose-Capillary: 87 mg/dL (ref 70–99)
Glucose-Capillary: 77 mg/dL (ref 70–99)
Glucose-Capillary: 160 mg/dL — ABNORMAL HIGH (ref 70–99)

## 2013-05-31 LAB — POCT I-STAT 3, ART BLOOD GAS (G3+)
Acid-Base Excess: 2 mmol/L (ref 0.0–2.0)
O2 Saturation: 58 %
TCO2: 30 mmol/L (ref 0–100)
pH, Arterial: 7.349 — ABNORMAL LOW (ref 7.350–7.450)
pO2, Arterial: 32 mmHg — CL (ref 80.0–100.0)

## 2013-05-31 MED ORDER — DARBEPOETIN ALFA-POLYSORBATE 150 MCG/0.3ML IJ SOLN
INTRAMUSCULAR | Status: AC
  Filled 2013-05-31: qty 0.3

## 2013-05-31 MED FILL — Furosemide Inj 10 MG/ML: INTRAMUSCULAR | Qty: 4 | Status: AC

## 2013-05-31 MED FILL — Ondansetron HCl Inj 4 MG/2ML (2 MG/ML): INTRAMUSCULAR | Qty: 2 | Status: AC

## 2013-05-31 MED FILL — Heparin Sodium (Porcine) 100 Unt/ML in Sodium Chloride 0.45%: INTRAMUSCULAR | Qty: 250 | Status: AC

## 2013-05-31 NOTE — Progress Notes (Signed)
Theresa [name] Progress Note    Subjective: Had some events of nausea, hypoglycemia and sob overnight, found hgb to be 6.6 this am- will transfuse in hd  Exam  Blood pressure 135/60, pulse 75, temperature 97.8 F (36.6 C), temperature source Oral, resp. rate 18, weight 73 kg (160 lb 15 oz), SpO2 100.00%. gen: on nasal cannula, looks better neck: + jvd, no bruit  chest: coarse insp bilat rales 1/2 up cor: regular, no M or rub abd: soft, mod obese, nt, nd, +bs  ext: no LE edema bilat neuro: alert, ox3, nonfocal  access: left arm AVG patent  Dialysis: [place] [facility] - [address] Dry wt 78 kg per patient   Assessment/Plan:                                                                       1. Resp distress / pulm infiltrates / fever: improved with uf below edw and levaquin/rocephin as well as cardiac stenting. Will continue to challenge 2. Chest pain / acute NSTEMI sp stent to RCA 12/20, per cards- slow recvovery 3. ESRD: [place]. HD normally tts via avg which would be mws with holiday schedule-  done sat and sun, will do today and short tx tomorrow- then try to go to  Saturday (reg schedule)- will need coordination with op unit at time of discharge 4. Anemia / CKD: Hgb falling-  Giving aranesp- will transfuse 2 units with hd today- may be bleeding somewhere?, on plavix per cards , will try no heparin with hd tomorrow  5. MBD / CKD: Ca 8 /Phos 5.2 pending, on phoslo 2 with meals  6. HTN/volume: on cozaar and coreg as op, on coreg here only- volume control will help as well- may not need cozaar 7. Hx DM / retinopathy 8. Hx COPD, former smoker  Theresa [name] A  MD   05/31/2013, 9:11 AM   Recent Labs Lab 05/28/13 1534  05/28/13 1742 05/29/13 0500 05/30/13 0245 05/31/13 0538  NA 134*  --  130* 140 136 134*  K 4.7  --  4.7 4.0 3.9 4.0  CL 94*  --  91* 99 97 95*  CO2  --   < > 26 31 31 27   GLUCOSE 225*  --  222* 155* 58* 98  BUN 33*  --  39*  17 15 27*  CREATININE 7.80*  --  8.23* 4.33* 3.82* 6.44*  CALCIUM  --   < > 7.4* 7.7* 8.0* 8.1*  PHOS  --   --  5.2*  --   --  3.1  < > = values in this interval not displayed.  Recent Labs Lab 05/28/13 1742 05/29/13 0500 05/31/13 0538  AST  --  43*  --   ALT  --  18  --   ALKPHOS  --  66  --   BILITOT  --  0.3  --   PROT  --  5.8*  --   ALBUMIN 2.2* 2.2* 2.3*    Recent Labs Lab 05/29/13 0500 05/30/13 0245 05/31/13 0808  WBC 9.9 7.7 5.2  NEUTROABS 9.3*  --   --   HGB 7.5* 7.5* 6.6*  HCT 23.6* 23.4* 20.1*  MCV 104.0* 105.4* 101.0*  PLT 139* 139*  135*   . aspirin  81 mg Oral Daily  . atorvastatin  80 mg Oral q1800  . calcium acetate  1,334 mg Oral TID WC  . carvedilol  12.5 mg Oral BID AC  . clopidogrel  75 mg Oral Q breakfast  . darbepoetin (ARANESP) injection - DIALYSIS  150 mcg Intravenous Q Tue-HD  . fluticasone  1 spray Each Nare Daily  . heparin  5,000 Units Subcutaneous Q8H  . insulin aspart  0-15 Units Subcutaneous TID WC  . insulin detemir  8 Units Subcutaneous QHS  . levofloxacin  500 mg Oral Q48H  . pantoprazole  40 mg Oral Daily  . pregabalin  50 mg Oral Daily  . sertraline  50 mg Oral Daily  . sodium chloride  3 mL Intravenous Q12H  . trimethoprim-polymyxin b  1 drop Both Eyes Q4H   . sodium chloride Stopped (05/30/13 0300)   sodium chloride, sodium chloride, sodium chloride, sodium chloride, sodium chloride, acetaminophen, acetaminophen-codeine, albuterol, diphenhydrAMINE, feeding supplement (NEPRO CARB STEADY), feeding supplement (NEPRO CARB STEADY), heparin, heparin, heparin, heparin, labetalol, lidocaine (PF), lidocaine (PF), lidocaine-prilocaine, lidocaine-prilocaine, morphine injection, ondansetron (ZOFRAN) IV, pentafluoroprop-tetrafluoroeth pentafluoroprop-tetrafluoroeth, promethazine, sodium chloride

## 2013-05-31 NOTE — Progress Notes (Signed)
Report was called to RN on unit 3 West. Pt will transfer to room 3 West 20.

## 2013-05-31 NOTE — Progress Notes (Signed)
SUBJECTIVE: Still has some dyspnea and cough. Chest pain with cough.   BP 104/53  Pulse 81  Temp(Src) 98.1 F (36.7 C) (Oral)  Resp 20  Wt 160 lb 15 oz (73 kg)  SpO2 100%  Intake/Output Summary (Last 24 hours) at 05/31/13 1115 Last data filed at 05/31/13 0500  Gross per 24 hour  Intake    840 ml  Output    125 ml  Net    715 ml    PHYSICAL EXAM General: Well developed, well nourished, in no acute distress. Alert and oriented x 3.  Psych:  Good affect, responds appropriately Neck: No JVD. No masses noted.  Lungs: Coarse BS bilaterally Heart: RRR with no murmurs noted. Abdomen: Bowel sounds are present. Soft, non-tender.  Extremities: No lower extremity edema.   LABS: Basic Metabolic Panel:  Recent Labs  05/28/13 1742  05/30/13 0245 05/31/13 0538  NA 130*  < > 136 134*  K 4.7  < > 3.9 4.0  CL 91*  < > 97 95*  CO2 26  < > 31 27  GLUCOSE 222*  < > 58* 98  BUN 39*  < > 15 27*  CREATININE 8.23*  < > 3.82* 6.44*  CALCIUM 7.4*  < > 8.0* 8.1*  PHOS 5.2*  --   --  3.1  < > = values in this interval not displayed. CBC:  Recent Labs  05/29/13 0500 05/30/13 0245 05/31/13 0808  WBC 9.9 7.7 5.2  NEUTROABS 9.3*  --   --   HGB 7.5* 7.5* 6.6*  HCT 23.6* 23.4* 20.1*  MCV 104.0* 105.4* 101.0*  PLT 139* 139* 135*   Cardiac Enzymes:  Recent Labs  05/29/13 0430  TROPONINI 13.58*   Fasting Lipid Panel:  Recent Labs  05/31/13 0538  CHOL 118  HDL 50  LDLCALC 42  TRIG 129  CHOLHDL 2.4    Current Meds: . aspirin  81 mg Oral Daily  . atorvastatin  80 mg Oral q1800  . calcium acetate  1,334 mg Oral TID WC  . carvedilol  12.5 mg Oral BID AC  . clopidogrel  75 mg Oral Q breakfast  . darbepoetin      . darbepoetin (ARANESP) injection - DIALYSIS  150 mcg Intravenous Q Tue-HD  . fluticasone  1 spray Each Nare Daily  . heparin  5,000 Units Subcutaneous Q8H  . insulin aspart  0-15 Units Subcutaneous TID WC  . insulin detemir  8 Units Subcutaneous QHS  .  levofloxacin  500 mg Oral Q48H  . pantoprazole  40 mg Oral Daily  . pregabalin  50 mg Oral Daily  . sertraline  50 mg Oral Daily  . sodium chloride  3 mL Intravenous Q12H  . trimethoprim-polymyxin b  1 drop Both Eyes Q4H   Echo 05/30/13: Left ventricle: The cavity size was normal. Systolic function was normal. There is severe hypokinesis of the basalinferior and inferoseptal myocardium. Features are consistent with a pseudonormal left ventricular filling pattern, with concomitant abnormal relaxation and increased filling pressure (grade 2 diastolic dysfunction). - Mitral valve: Moderate regurgitation. - Left atrium: The atrium was mildly dilated. - Pulmonary arteries: PA peak pressure: 68mm Hg (S).  ASSESSMENT AND PLAN:   1. NSTEMI/CAD: S/p PCI/DES of the RCA on 12/20. Continue ASA, statin, beta blocker and Plavix.  Echo with preserved LV systolic function.    2. Acute resp failure/Pulm infiltrates: Thought to be due to CHF with pulmonary edema with possible CAP. Productive cough  with pleuritic chest pain. Coarse breath sounds throughout. Antibiotics per pulmonology. Currently off bipap and oxygenating well on [place].   3. Acute diastolic CHF: Volume mgmt per nephrology (T, R, S dialysis). Cont beta blocker.   4. ESRD: Per nephrology.   5. Anemia: H/H down today. Pt to receive pRBCs in HD. No obvious source of blood loss.    6. DM: Cont SSI and levemir.   7. HTN: BP controlled.   8. Mitral regurgitation: Moderate by echo 05/30/13. Follow   MCALHANY,CHRISTOPHER  12/23/201411:15 AM

## 2013-05-31 NOTE — Progress Notes (Signed)
CARDIAC REHAB PHASE I   PRE:  Rate/Rhythm: 80 SR  BP:  Supine: 154/50  Sitting:   Standing:    SaO2: 100 2L RA 96  MODE:  Ambulation: 40 ft   POST:  Rate/Rhythm: 87 SR  BP:  Supine:   Sitting: 112/40  Standing:    SaO2: 93 RA 1415-1440 Assisted X 1 and used walker to ambulate. Pt c/o of feeling weak which she usually does after dialysis. Pt was able to walk 40 feet in hall. BP after 112/40. Her only c/o with walking was weakness. Pt to recliner after walk with call light in reach. We will follow pt tomorrow.  Rodney Langton RN 05/31/2013 2:41 PM

## 2013-05-31 NOTE — Progress Notes (Signed)
Lab called with critical hemoglobin of 6.6.  Patient is in hemodialysis.  Called RN Verdene Lennert to let her know of the critical lab. Per Verdene Lennert they have HD orders to transfuse with a hemoglobin less than 7.  She states that they will transfuse per their HD orders.

## 2013-05-31 NOTE — Procedures (Signed)
Patient was seen on dialysis and the procedure was supervised.  BFR 450  Via avg BP is  135/60.   Patient appears to be tolerating treatment well  Theresa [name] A 05/31/2013

## 2013-06-01 LAB — TYPE AND SCREEN
ABO/RH(D): O POS
Unit division: 0
Unit division: 0

## 2013-06-01 LAB — CBC
HCT: 32 % — ABNORMAL LOW (ref 36.0–46.0)
Hemoglobin: 11 g/dL — ABNORMAL LOW (ref 12.0–15.0)
MCV: 95.5 fL (ref 78.0–100.0)
RDW: 18.1 % — ABNORMAL HIGH (ref 11.5–15.5)
WBC: 6.3 10*3/uL (ref 4.0–10.5)

## 2013-06-01 LAB — PROCALCITONIN: Procalcitonin: 15.61 ng/mL

## 2013-06-01 LAB — GLUCOSE, CAPILLARY
Glucose-Capillary: 89 mg/dL (ref 70–99)
Glucose-Capillary: 173 mg/dL — ABNORMAL HIGH (ref 70–99)

## 2013-06-01 LAB — BASIC METABOLIC PANEL
CO2: 30 mEq/L (ref 19–32)
Calcium: 8.5 mg/dL (ref 8.4–10.5)
Chloride: 92 mEq/L — ABNORMAL LOW (ref 96–112)
Creatinine, Ser: 4.5 mg/dL — ABNORMAL HIGH (ref 0.50–1.10)
Glucose, Bld: 124 mg/dL — ABNORMAL HIGH (ref 70–99)
Potassium: 3.7 mEq/L (ref 3.5–5.1)

## 2013-06-01 NOTE — Progress Notes (Signed)
SUBJECTIVE: No chest pain. Breathing is better.   BP 123/59  Pulse 70  Temp(Src) 98.4 F (36.9 C) (Oral)  Resp 17  Ht 5\' 7"  (1.702 m)  Wt 159 lb 9.8 oz (72.4 kg)  BMI 24.99 kg/m2  SpO2 92%  Intake/Output Summary (Last 24 hours) at 06/01/13 [id number] Last data filed at 05/31/13 2145  Gross per 24 hour  Intake   1303 ml  Output   3012 ml  Net  -1709 ml    PHYSICAL EXAM General: Well developed, well nourished, in no acute distress. Alert and oriented x 3.  Psych:  Good affect, responds appropriately Neck: No JVD. No masses noted.  Lungs: Clear bilaterally with no wheezes or rhonci noted.  Heart: RRR with no murmurs noted. Abdomen: Bowel sounds are present. Soft, non-tender.  Extremities: No lower extremity edema.   LABS: Basic Metabolic Panel:  Recent Labs  05/31/13 0538 06/01/13 0514  NA 134* 134*  K 4.0 3.7  CL 95* 92*  CO2 27 30  GLUCOSE 98 124*  BUN 27* 18  CREATININE 6.44* 4.50*  CALCIUM 8.1* 8.5  PHOS 3.1  --    CBC:  Recent Labs  05/31/13 0808 06/01/13 0514  WBC 5.2 6.3  HGB 6.6* 11.0*  HCT 20.1* 32.0*  MCV 101.0* 95.5  PLT 135* 160   Fasting Lipid Panel:  Recent Labs  05/31/13 0538  CHOL 118  HDL 50  LDLCALC 42  TRIG 129  CHOLHDL 2.4    Current Meds: . aspirin  81 mg Oral Daily  . atorvastatin  80 mg Oral q1800  . calcium acetate  1,334 mg Oral TID WC  . carvedilol  12.5 mg Oral BID AC  . clopidogrel  75 mg Oral Q breakfast  . darbepoetin (ARANESP) injection - DIALYSIS  150 mcg Intravenous Q Tue-HD  . fluticasone  1 spray Each Nare Daily  . heparin  5,000 Units Subcutaneous Q8H  . insulin aspart  0-15 Units Subcutaneous TID WC  . insulin detemir  8 Units Subcutaneous QHS  . levofloxacin  500 mg Oral Q48H  . pantoprazole  40 mg Oral Daily  . pregabalin  50 mg Oral Daily  . sertraline  50 mg Oral Daily  . sodium chloride  3 mL Intravenous Q12H  . trimethoprim-polymyxin b  1 drop Both Eyes Q4H    Echo 05/30/13:  Left  ventricle: The cavity size was normal. Systolic function was normal. There is severe hypokinesis of the basalinferior and inferoseptal myocardium. Features are consistent with a pseudonormal left ventricular filling pattern, with concomitant abnormal relaxation and increased filling pressure (grade 2 diastolic dysfunction). - Mitral valve: Moderate regurgitation. - Left atrium: The atrium was mildly dilated. - Pulmonary arteries: PA peak pressure: 31mm Hg (S).  ASSESSMENT AND PLAN:   1. NSTEMI/CAD: S/p PCI/DES of the RCA on 12/20. Continue ASA, statin, beta blocker and Plavix. Echo with preserved LV systolic function.   2. Acute resp failure/Pulm infiltrates: Thought to be due to CHF with pulmonary edema with possible CAP. Improving. PCCM has signed off. Recommended 7 day course of Levaquin.    3. Acute diastolic CHF: Volume mgmt per nephrology (T, R, S dialysis). Cont beta blocker.   4. ESRD: Per nephrology.   5. Anemia: H/H up post transfusion 05/31/13.    6. DM: Cont SSI and levemir.   7. HTN: BP controlled.   8. Mitral regurgitation: Moderate by echo 05/30/13. Follow  Reassess in am. She is getting better.  Possible d/c home in am.     MCALHANY,CHRISTOPHER  12/24/20147:39 AM

## 2013-06-01 NOTE — Progress Notes (Signed)
[facility] Progress Note    Subjective: Feels better, walked with cardiac rehab yest.. Still o2 req- needed transfusion with hd yest for hgb 6.6, now is 11?   Exam  Blood pressure 123/59, pulse 70, temperature 98.4 F (36.9 C), temperature source Oral, resp. rate 17, height 5\' 7"  (1.702 m), weight 72.4 kg (159 lb 9.8 oz), SpO2 92.00%. gen: on nasal cannula, looks better neck: + jvd, no bruit  chest: coarse insp bilat rales 1/2 up cor: regular, no M or rub abd: soft, mod obese, nt, nd, +bs  ext: no LE edema bilat neuro: alert, ox3, nonfocal  access: left arm AVG patent  Dialysis: [facility] - [address] Dry wt 78 kg per patient   Assessment/Plan:                                                                       1. Resp distress / pulm infiltrates / fever: improved with uf below edw and levaquin/rocephin as well as cardiac stenting. Will continue to challenge.  Will need much lower edw at discharge 2. Chest pain / acute NSTEMI sp stent to RCA 12/20, per cards- slow recvovery 3. ESRD: [place]. HD normally tts via avg which would be mws with holiday schedule-  done sat and sun, then Tuesday, short tx today (wed)- then try to go to Saturday (reg schedule)- will need coordination with op unit at time of discharge.   4. Anemia / CKD: Hgb falling-  Giving aranesp- will transfuse 2 units with hd yest- may be bleeding somewhere?, on plavix per cards , will try no heparin with hd today- now is 11? One of these measurements likely wrong- cont to follow 5. MBD / CKD: Ca 8 /Phos 5.2 pending, on phoslo 2 with meals  6. HTN/volume: on cozaar and coreg as op, on coreg here only- volume control will help as well- may not need cozaar 7. Hx DM / retinopathy 8. Hx COPD, former smoker 9. Dispo- per cards- not sure what they are thinking  Theresa [name] A  MD   06/01/2013, 7:33 AM   Recent Labs Lab 05/28/13 1534 05/28/13 1742  05/30/13 0245  05/31/13 0538 06/01/13 0514  NA 134* 130*  < > 136 134* 134*  K 4.7 4.7  < > 3.9 4.0 3.7  CL 94* 91*  < > 97 95* 92*  CO2  --  26  < > 31 27 30   GLUCOSE 225* 222*  < > 58* 98 124*  BUN 33* 39*  < > 15 27* 18  CREATININE 7.80* 8.23*  < > 3.82* 6.44* 4.50*  CALCIUM  --  7.4*  < > 8.0* 8.1* 8.5  PHOS  --  5.2*  --   --  3.1  --   < > = values in this interval not displayed.  Recent Labs Lab 05/28/13 1742 05/29/13 0500 05/31/13 0538  AST  --  43*  --   ALT  --  18  --   ALKPHOS  --  66  --   BILITOT  --  0.3  --   PROT  --  5.8*  --   ALBUMIN 2.2* 2.2* 2.3*    Recent Labs Lab 05/29/13 0500 05/30/13  0245 05/31/13 0808 06/01/13 0514  WBC 9.9 7.7 5.2 6.3  NEUTROABS 9.3*  --   --   --   HGB 7.5* 7.5* 6.6* 11.0*  HCT 23.6* 23.4* 20.1* 32.0*  MCV 104.0* 105.4* 101.0* 95.5  PLT 139* 139* 135* 160   . aspirin  81 mg Oral Daily  . atorvastatin  80 mg Oral q1800  . calcium acetate  1,334 mg Oral TID WC  . carvedilol  12.5 mg Oral BID AC  . clopidogrel  75 mg Oral Q breakfast  . darbepoetin (ARANESP) injection - DIALYSIS  150 mcg Intravenous Q Tue-HD  . fluticasone  1 spray Each Nare Daily  . heparin  5,000 Units Subcutaneous Q8H  . insulin aspart  0-15 Units Subcutaneous TID WC  . insulin detemir  8 Units Subcutaneous QHS  . levofloxacin  500 mg Oral Q48H  . pantoprazole  40 mg Oral Daily  . pregabalin  50 mg Oral Daily  . sertraline  50 mg Oral Daily  . sodium chloride  3 mL Intravenous Q12H  . trimethoprim-polymyxin b  1 drop Both Eyes Q4H   . sodium chloride Stopped (05/30/13 0300)   sodium chloride, sodium chloride, sodium chloride, acetaminophen, acetaminophen-codeine, albuterol, diphenhydrAMINE, feeding supplement (NEPRO CARB STEADY), heparin, heparin, heparin, labetalol, lidocaine (PF), lidocaine-prilocaine, morphine injection, ondansetron (ZOFRAN) IV, pentafluoroprop-tetrafluoroeth, promethazine, sodium chloride

## 2013-06-01 NOTE — Procedures (Signed)
Patient was seen on dialysis and the procedure was supervised.  BFR 400  Via avg BP is  123/59.   Patient appears to be tolerating treatment well  Dakisha Schoof A 06/01/2013

## 2013-06-01 NOTE — Progress Notes (Signed)
Cardiac Rehab [phone] On arrival pt in bed c/o of feeling very tired since dialysis. She wanted o wait to ambulate due to feeling tired. I completed MI and stent education with pt. She voices understanding. Pt agrees to Outpt CRP in [place], will send referral. Will return to walk with after she has rested. Deon Pilling, RN 06/01/2013 11:25 AM

## 2013-06-01 NOTE — Progress Notes (Signed)
CARDIAC REHAB PHASE I   PRE:  Rate/Rhythm: 80 SR  BP:  Supine: 131/63  Sitting:   Standing:    SaO2: 95 RA  MODE:  Ambulation: 150 ft   POST:  Rate/Rhythm: 91  BP:  Supine:   Sitting: 136/65  Standing:    SaO2: 93 RA 1328-1355 Assisted X 1 and used walker to ambulate. Gait steady with walker, slow pace. Pt able to walk 150 feet with a few standing rest stops. VS stable Pt to recliner after walk with call light in reach.  Rodney Langton RN 06/01/2013 1:55 PM

## 2013-06-02 LAB — CULTURE, BLOOD (SINGLE)

## 2013-06-02 LAB — GLUCOSE, CAPILLARY
Glucose-Capillary: 126 mg/dL — ABNORMAL HIGH (ref 70–99)
Glucose-Capillary: 137 mg/dL — ABNORMAL HIGH (ref 70–99)
Glucose-Capillary: 250 mg/dL — ABNORMAL HIGH (ref 70–99)
Glucose-Capillary: 129 mg/dL — ABNORMAL HIGH (ref 70–99)

## 2013-06-02 MED ORDER — LOSARTAN POTASSIUM 50 MG PO TABS
100.0000 mg | ORAL_TABLET | Freq: Every day | ORAL | Status: DC
  Administered 2013-06-02 – 2013-06-03 (×2): 100 mg via ORAL
  Filled 2013-06-02 (×2): qty 2

## 2013-06-02 NOTE — Progress Notes (Signed)
[facility] Progress Note    Subjective: Feels better, walked with cardiac rehab yest. No o2 needed, possible plans for discharge today? HD yest, took another 1400 off - weight 69 this am, much lower than her edw pre hosp     Exam  Blood pressure 156/54, pulse 76, temperature 98.8 F (37.1 C), temperature source Oral, resp. rate 20, height 5\' 7"  (1.702 m), weight 69.986 kg (154 lb 4.7 oz), SpO2 98.00%. gen: on nasal cannula, looks better neck: + jvd, no bruit  chest: coarse insp bilat rales 1/2 up cor: regular, no M or rub abd: soft, mod obese, nt, nd, +bs  ext: no LE edema bilat neuro: alert, ox3, nonfocal  access: left arm AVG patent  Dialysis: [facility] - [address] Dry wt 78 kg per patient   Assessment/Plan:                                                                       1. Resp distress / pulm infiltrates / fever: improved with uf below edw and levaquin as well as cardiac stenting. Will continue to challenge.  Will need much lower edw at discharge- got to 69 kg here 2. Chest pain / acute NSTEMI sp stent to RCA 12/20, per cards- slow recvovery 3. ESRD: [place]. HD normally tts via avg which would be mws with holiday schedule-  done sat and sun, then Tuesday, short tx  wed- then try to go to Saturday (reg schedule)- will need coordination with op unit at time of discharge.  HD either here or at OP unit on Sat, She says she only runs 3 hours and 15 min- may be part of issue with volume ? 4. Anemia / CKD: Hgb falling-  Giving aranesp- transfused 2 units with hd 12/23  may be bleeding somewhere?, on plavix per cards , will try no heparin with hd today- now is 11? One of these measurements likely wrong- cont to follow 5. MBD / CKD: Ca 8.5 /Phos 3.1pending, on phoslo 2 with meals  6. HTN/volume: on cozaar and coreg as op, on coreg here only- volume control will help as well- may not need cozaar 7. Hx DM / retinopathy 8. Hx COPD, former  smoker 9. Dispo- per cards- hopefully discharge soon   Sevag Shearn A  MD   06/02/2013, 9:05 AM   Recent Labs Lab 05/28/13 1534 05/28/13 1742  05/30/13 0245 05/31/13 0538 06/01/13 0514  NA 134* 130*  < > 136 134* 134*  K 4.7 4.7  < > 3.9 4.0 3.7  CL 94* 91*  < > 97 95* 92*  CO2  --  26  < > 31 27 30   GLUCOSE 225* 222*  < > 58* 98 124*  BUN 33* 39*  < > 15 27* 18  CREATININE 7.80* 8.23*  < > 3.82* 6.44* 4.50*  CALCIUM  --  7.4*  < > 8.0* 8.1* 8.5  PHOS  --  5.2*  --   --  3.1  --   < > = values in this interval not displayed.  Recent Labs Lab 05/28/13 1742 05/29/13 0500 05/31/13 0538  AST  --  43*  --   ALT  --  18  --  ALKPHOS  --  66  --   BILITOT  --  0.3  --   PROT  --  5.8*  --   ALBUMIN 2.2* 2.2* 2.3*    Recent Labs Lab 05/29/13 0500 05/30/13 0245 05/31/13 0808 06/01/13 0514  WBC 9.9 7.7 5.2 6.3  NEUTROABS 9.3*  --   --   --   HGB 7.5* 7.5* 6.6* 11.0*  HCT 23.6* 23.4* 20.1* 32.0*  MCV 104.0* 105.4* 101.0* 95.5  PLT 139* 139* 135* 160   . aspirin  81 mg Oral Daily  . atorvastatin  80 mg Oral q1800  . calcium acetate  1,334 mg Oral TID WC  . carvedilol  12.5 mg Oral BID AC  . clopidogrel  75 mg Oral Q breakfast  . darbepoetin (ARANESP) injection - DIALYSIS  150 mcg Intravenous Q Tue-HD  . fluticasone  1 spray Each Nare Daily  . heparin  5,000 Units Subcutaneous Q8H  . insulin aspart  0-15 Units Subcutaneous TID WC  . insulin detemir  8 Units Subcutaneous QHS  . levofloxacin  500 mg Oral Q48H  . pantoprazole  40 mg Oral Daily  . pregabalin  50 mg Oral Daily  . sertraline  50 mg Oral Daily  . sodium chloride  3 mL Intravenous Q12H  . trimethoprim-polymyxin b  1 drop Both Eyes Q4H   . sodium chloride Stopped (05/30/13 0300)   sodium chloride, sodium chloride, sodium chloride, acetaminophen, acetaminophen-codeine, albuterol, diphenhydrAMINE, feeding supplement (NEPRO CARB STEADY), heparin, heparin, heparin, labetalol, lidocaine (PF),  lidocaine-prilocaine, morphine injection, ondansetron (ZOFRAN) IV, pentafluoroprop-tetrafluoroeth, promethazine, sodium chloride

## 2013-06-02 NOTE — Progress Notes (Signed)
SUBJECTIVE: Still feeling very weak. No chest pain. Some improvement in breathing, still with dyspnea.   BP 156/54  Pulse 76  Temp(Src) 98.8 F (37.1 C) (Oral)  Resp 20  Ht 5\' 7"  (1.702 m)  Wt 154 lb 4.7 oz (69.986 kg)  BMI 24.16 kg/m2  SpO2 98%  Intake/Output Summary (Last 24 hours) at 06/02/13 [id number] Last data filed at 06/01/13 2207  Gross per 24 hour  Intake    343 ml  Output      0 ml  Net    343 ml    PHYSICAL EXAM General: Well developed, well nourished, in no acute distress. Alert and oriented x 3.  Psych:  Good affect, responds appropriately Neck: No JVD. No masses noted.  Lungs: Clear bilaterally with no wheezes or rhonci noted.  Heart: RRR with no murmurs noted. Abdomen: Bowel sounds are present. Soft, non-tender.  Extremities: No lower extremity edema.   LABS: Basic Metabolic Panel:  Recent Labs  05/31/13 0538 06/01/13 0514  NA 134* 134*  K 4.0 3.7  CL 95* 92*  CO2 27 30  GLUCOSE 98 124*  BUN 27* 18  CREATININE 6.44* 4.50*  CALCIUM 8.1* 8.5  PHOS 3.1  --    CBC:  Recent Labs  05/31/13 0808 06/01/13 0514  WBC 5.2 6.3  HGB 6.6* 11.0*  HCT 20.1* 32.0*  MCV 101.0* 95.5  PLT 135* 160   Fasting Lipid Panel:  Recent Labs  05/31/13 0538  CHOL 118  HDL 50  LDLCALC 42  TRIG 129  CHOLHDL 2.4    Current Meds: . aspirin  81 mg Oral Daily  . atorvastatin  80 mg Oral q1800  . calcium acetate  1,334 mg Oral TID WC  . carvedilol  12.5 mg Oral BID AC  . clopidogrel  75 mg Oral Q breakfast  . darbepoetin (ARANESP) injection - DIALYSIS  150 mcg Intravenous Q Tue-HD  . fluticasone  1 spray Each Nare Daily  . heparin  5,000 Units Subcutaneous Q8H  . insulin aspart  0-15 Units Subcutaneous TID WC  . insulin detemir  8 Units Subcutaneous QHS  . levofloxacin  500 mg Oral Q48H  . pantoprazole  40 mg Oral Daily  . pregabalin  50 mg Oral Daily  . sertraline  50 mg Oral Daily  . sodium chloride  3 mL Intravenous Q12H  . trimethoprim-polymyxin  b  1 drop Both Eyes Q4H     ASSESSMENT AND PLAN: 57 yo female with ESRD on HD who was transferred to [facility] from [facility] with chest pain/NSTEMI and had cath with severe stenosis mid RCA, now s/p DES x 1. Also with pneumonia and volume overload/CHF. She has done well. CHF resolved with aggressive HD. On 7 day course of Levaquin (being dosed every 48 hours).   1. NSTEMI/CAD: S/p PCI/DES of the RCA on 12/20. Continue ASA, statin, beta blocker and Plavix. Echo with preserved LV systolic function.   2. Acute resp failure/Pulm infiltrates: Due to CHF with pulmonary edema with CAP. Improving. PCCM has signed off. Recommended 7 day course of Levaquin. Today is day 4.   3. Acute diastolic CHF: Volume mgmt per nephrology. Cont beta blocker.   4. ESRD: Per nephrology.   5. Anemia: H/H up post transfusion 05/31/13.   6. DM: Cont SSI and levemir.   7. HTN: BP elevated. Will add back home dose of Cozaar 100 mg po Qdaily. Continue Coreg.   8. Mitral regurgitation: Moderate by echo 05/30/13.  Follow   Dispo: Probable D/C home tomorrow.       Layne Dilauro  12/25/20149:58 AM

## 2013-06-03 LAB — CBC
Platelets: 240 10*3/uL (ref 150–400)
RBC: 3.44 MIL/uL — ABNORMAL LOW (ref 3.87–5.11)
RDW: 17.3 % — ABNORMAL HIGH (ref 11.5–15.5)
WBC: 7.7 10*3/uL (ref 4.0–10.5)

## 2013-06-03 LAB — PROCALCITONIN: Procalcitonin: 7.18 ng/mL

## 2013-06-03 LAB — GLUCOSE, CAPILLARY: Glucose-Capillary: 146 mg/dL — ABNORMAL HIGH (ref 70–99)

## 2013-06-03 LAB — BASIC METABOLIC PANEL
CO2: 25 mEq/L (ref 19–32)
Chloride: 96 mEq/L (ref 96–112)
GFR calc Af Amer: 7 mL/min — ABNORMAL LOW (ref >90)
Potassium: 4.2 mEq/L (ref 3.5–5.1)
Sodium: 136 mEq/L (ref 135–145)

## 2013-06-03 MED ORDER — LEVOFLOXACIN 500 MG PO TABS: 500.0000 mg | ORAL_TABLET | ORAL | Status: DC

## 2013-06-03 MED ORDER — CARVEDILOL 12.5 MG PO TABS: 12.5000 mg | ORAL_TABLET | Freq: Two times a day (BID) | ORAL | Status: DC

## 2013-06-03 MED ORDER — NITROGLYCERIN 0.4 MG SL SUBL: 0.4000 mg | SUBLINGUAL_TABLET | SUBLINGUAL | Status: DC | PRN

## 2013-06-03 MED ORDER — CLOPIDOGREL BISULFATE 75 MG PO TABS: 75.0000 mg | ORAL_TABLET | Freq: Every day | ORAL | Status: DC

## 2013-06-03 NOTE — Progress Notes (Signed)
SUBJECTIVE: Feels better. NO chest pain or SOB.   BP 163/53  Pulse 74  Temp(Src) 98.7 F (37.1 C) (Oral)  Resp 15  Ht 5\' 7"  (1.702 m)  Wt 154 lb 4.7 oz (69.986 kg)  BMI 24.16 kg/m2  SpO2 96%  Intake/Output Summary (Last 24 hours) at 06/03/13 0720 Last data filed at 06/02/13 1249  Gross per 24 hour  Intake    600 ml  Output      0 ml  Net    600 ml    PHYSICAL EXAM General: Well developed, well nourished, in no acute distress. Alert and oriented x 3.  Psych:  Good affect, responds appropriately Neck: No JVD. No masses noted.  Lungs: Clear bilaterally with no wheezes or rhonci noted.  Heart: RRR with no murmurs noted. Abdomen: Bowel sounds are present. Soft, non-tender.  Extremities: No lower extremity edema.   LABS: Basic Metabolic Panel:  Recent Labs  06/01/13 0514 06/03/13 0515  NA 134* 136  K 3.7 4.2  CL 92* 96  CO2 30 25  GLUCOSE 124* 173*  BUN 18 28*  CREATININE 4.50* 6.93*  CALCIUM 8.5 8.4   CBC:  Recent Labs  06/01/13 0514 06/03/13 0515  WBC 6.3 7.7  HGB 11.0* 11.1*  HCT 32.0* 33.5*  MCV 95.5 97.4  PLT 160 240   Current Meds: . aspirin  81 mg Oral Daily  . atorvastatin  80 mg Oral q1800  . calcium acetate  1,334 mg Oral TID WC  . carvedilol  12.5 mg Oral BID AC  . clopidogrel  75 mg Oral Q breakfast  . darbepoetin (ARANESP) injection - DIALYSIS  150 mcg Intravenous Q Tue-HD  . fluticasone  1 spray Each Nare Daily  . heparin  5,000 Units Subcutaneous Q8H  . insulin aspart  0-15 Units Subcutaneous TID WC  . insulin detemir  8 Units Subcutaneous QHS  . levofloxacin  500 mg Oral Q48H  . losartan  100 mg Oral Daily  . pantoprazole  40 mg Oral Daily  . pregabalin  50 mg Oral Daily  . sertraline  50 mg Oral Daily  . sodium chloride  3 mL Intravenous Q12H  . trimethoprim-polymyxin b  1 drop Both Eyes Q4H     ASSESSMENT AND PLAN: 57 yo female with ESRD on HD who was transferred to [facility] from [facility] with chest pain/NSTEMI and had cath  with severe stenosis mid RCA, now s/p DES x 1. Also with pneumonia and volume overload/CHF. She has done well. CHF resolved with aggressive HD. On 7 day course of Levaquin (being dosed every 48 hours).   1. NSTEMI/CAD: S/p PCI/DES of the RCA on 12/20. Continue ASA, statin, beta blocker and Plavix. Echo with preserved LV systolic function.   2. Acute resp failure/Pulm infiltrates: Due to CHF with pulmonary edema with CAP. Improving. PCCM has signed off. Recommended 7 day course of Levaquin. Today is day 5.   3. Acute diastolic CHF: Volume mgmt per nephrology. Cont beta blocker.   4. ESRD: Per nephrology. Plans for HD in her outpatient unit tomorrow.   5. Anemia: H/H up post transfusion 05/31/13.   6. DM: Cont SSI and levemir.   7. HTN: Back on home meds.    8. Mitral regurgitation: Moderate by echo 05/30/13. Follow   Dispo: D/C home today. Follow up with Dr. Rockey Situ in [place]. She will need HD tomorrow in her outpatient unit.       Theresa [name]  12/26/20147:20 AM

## 2013-06-03 NOTE — Discharge Summary (Signed)
See full note this am. cdm 

## 2013-06-03 NOTE — Progress Notes (Signed)
[facility] ROUNDING NOTE   Subjective:   Interval History: hoping to go home today  Objective:  Vital signs in last 24 hours:  Temp:  [98.2 F (36.8 C)-98.7 F (37.1 C)] 98.7 F (37.1 C) (12/26 0553) Pulse Rate:  [70-74] 74 (12/26 0553) Resp:  [15-18] 15 (12/26 0553) BP: (108-163)/(49-56) 163/53 mmHg (12/26 0553) SpO2:  [96 %-100 %] 96 % (12/26 0553)  Weight change:  Filed Weights   06/01/13 0610 06/01/13 0853 06/02/13 0408  Weight: 72.4 kg (159 lb 9.8 oz) 70.9 kg (156 lb 4.9 oz) 69.986 kg (154 lb 4.7 oz)    Intake/Output: I/O last 3 completed shifts: In: 9 [P.O.:600; I.V.:3] Out: -    Intake/Output this shift:     CVS- RRR RS-  Still basal rales but non distressed ABD- BS present soft non-distended EXT- no edema   Basic Metabolic Panel:  Recent Labs Lab 05/28/13 1534  05/28/13 1742 05/29/13 0500 05/30/13 0245 05/31/13 0538 06/01/13 0514 06/03/13 0515  NA 134*  --  130* 140 136 134* 134* 136  K 4.7  --  4.7 4.0 3.9 4.0 3.7 4.2  CL 94*  --  91* 99 97 95* 92* 96  CO2  --   < > 26 31 31 27 30 25   GLUCOSE 225*  --  222* 155* 58* 98 124* 173*  BUN 33*  --  39* 17 15 27* 18 28*  CREATININE 7.80*  --  8.23* 4.33* 3.82* 6.44* 4.50* 6.93*  CALCIUM  --   < > 7.4* 7.7* 8.0* 8.1* 8.5 8.4  PHOS  --   --  5.2*  --   --  3.1  --   --   < > = values in this interval not displayed.  Liver Function Tests:  Recent Labs Lab 05/28/13 1742 05/29/13 0500 05/31/13 0538  AST  --  43*  --   ALT  --  18  --   ALKPHOS  --  66  --   BILITOT  --  0.3  --   PROT  --  5.8*  --   ALBUMIN 2.2* 2.2* 2.3*   No results found for this basename: LIPASE, AMYLASE,  in the last 168 hours No results found for this basename: AMMONIA,  in the last 168 hours  CBC:  Recent Labs Lab 05/29/13 0500 05/30/13 0245 05/31/13 0808 06/01/13 0514 06/03/13 0515  WBC 9.9 7.7 5.2 6.3 7.7  NEUTROABS 9.3*  --   --   --   --   HGB 7.5* 7.5* 6.6* 11.0* 11.1*  HCT 23.6*  23.4* 20.1* 32.0* 33.5*  MCV 104.0* 105.4* 101.0* 95.5 97.4  PLT 139* 139* 135* 160 240    Cardiac Enzymes:  Recent Labs Lab 05/29/13 0430  TROPONINI 13.58*    BNP: No components found with this basename: POCBNP,   CBG:  Recent Labs Lab 06/02/13 0733 06/02/13 1143 06/02/13 1641 06/02/13 2002 06/03/13 0728  GLUCAP 126* 137* 250* 129* 146*    Microbiology: Results for orders placed during the hospital encounter of 05/28/13  CULTURE, BLOOD (ROUTINE X 2)     Status: None   Collection Time    05/28/13  5:55 PM      Result Value Range Status   Specimen Description BLOOD LEFT ARM GRAFT   Final   Special Requests BOTTLES DRAWN AEROBIC AND ANAEROBIC 10CC   Final   Culture  Setup Time     Final   Value: 05/29/2013 01:17  Performed at [facility]     Final   Value:        BLOOD CULTURE RECEIVED NO GROWTH TO DATE CULTURE WILL BE HELD FOR 5 DAYS BEFORE ISSUING A FINAL NEGATIVE REPORT     Performed at [facility]   Report Status PENDING   Incomplete  CULTURE, BLOOD (ROUTINE X 2)     Status: None   Collection Time    05/28/13  6:53 PM      Result Value Range Status   Specimen Description HEMODIALYSIS LINE   Final   Special Requests BOTTLES DRAWN AEROBIC AND ANAEROBIC 10CC   Final   Culture  Setup Time     Final   Value: 05/29/2013 01:17     Performed at [facility]   Culture     Final   Value:        BLOOD CULTURE RECEIVED NO GROWTH TO DATE CULTURE WILL BE HELD FOR 5 DAYS BEFORE ISSUING A FINAL NEGATIVE REPORT     Performed at [facility]   Report Status PENDING   Incomplete  MRSA PCR SCREENING     Status: Abnormal   Collection Time    05/29/13 12:38 AM      Result Value Range Status   MRSA by PCR INVALID RESULTS, SPECIMEN SENT FOR CULTURE (*) NEGATIVE Final   Comment: CALLED TO ROBERTS,J RN [id number] AT [id number] SKEEN,P                The GeneXpert MRSA Assay (FDA     approved for NASAL specimens     only), is one component  of a     comprehensive MRSA colonization     surveillance program. It is not     intended to diagnose MRSA     infection nor to guide or     monitor treatment for     MRSA infections.  MRSA CULTURE     Status: None   Collection Time    05/29/13 12:38 AM      Result Value Range Status   Specimen Description NASAL SWAB   Final   Special Requests NONE   Final   Culture     Final   Value: NO STAPHYLOCOCCUS AUREUS ISOLATED     Note: NOMRSA     Performed at [facility]   Report Status 05/31/2013 FINAL   Final  CULTURE, BLOOD (ROUTINE X 2)     Status: None   Collection Time    05/29/13  7:50 AM      Result Value Range Status   Specimen Description BLOOD RIGHT ARM   Final   Special Requests BOTTLES DRAWN AEROBIC AND ANAEROBIC 10CC   Final   Culture  Setup Time     Final   Value: 05/29/2013 14:39     Performed at [facility]   Culture     Final   Value:        BLOOD CULTURE RECEIVED NO GROWTH TO DATE CULTURE WILL BE HELD FOR 5 DAYS BEFORE ISSUING A FINAL NEGATIVE REPORT     Performed at [facility]   Report Status PENDING   Incomplete  CULTURE, BLOOD (ROUTINE X 2)     Status: None   Collection Time    05/29/13  8:00 AM      Result Value Range Status   Specimen Description BLOOD RIGHT HAND   Final   Special Requests BOTTLES DRAWN  AEROBIC AND ANAEROBIC 10CC   Final   Culture  Setup Time     Final   Value: 05/29/2013 14:39     Performed at [facility]   Culture     Final   Value:        BLOOD CULTURE RECEIVED NO GROWTH TO DATE CULTURE WILL BE HELD FOR 5 DAYS BEFORE ISSUING A FINAL NEGATIVE REPORT     Performed at [facility]   Report Status PENDING   Incomplete    Coagulation Studies: No results found for this basename: LABPROT, INR,  in the last 72 hours  Urinalysis: No results found for this basename: COLORURINE, APPERANCEUR, LABSPEC, PHURINE, GLUCOSEU, HGBUR, BILIRUBINUR, KETONESUR, PROTEINUR, UROBILINOGEN, NITRITE, LEUKOCYTESUR,   in the last 72 hours    Imaging: No results found.   Medications:   . sodium chloride Stopped (05/30/13 0300)   . aspirin  81 mg Oral Daily  . atorvastatin  80 mg Oral q1800  . calcium acetate  1,334 mg Oral TID WC  . carvedilol  12.5 mg Oral BID AC  . clopidogrel  75 mg Oral Q breakfast  . darbepoetin (ARANESP) injection - DIALYSIS  150 mcg Intravenous Q Tue-HD  . fluticasone  1 spray Each Nare Daily  . heparin  5,000 Units Subcutaneous Q8H  . insulin aspart  0-15 Units Subcutaneous TID WC  . insulin detemir  8 Units Subcutaneous QHS  . levofloxacin  500 mg Oral Q48H  . losartan  100 mg Oral Daily  . pantoprazole  40 mg Oral Daily  . pregabalin  50 mg Oral Daily  . sertraline  50 mg Oral Daily  . sodium chloride  3 mL Intravenous Q12H  . trimethoprim-polymyxin b  1 drop Both Eyes Q4H   sodium chloride, sodium chloride, sodium chloride, acetaminophen, acetaminophen-codeine, albuterol, diphenhydrAMINE, feeding supplement (NEPRO CARB STEADY), heparin, heparin, heparin, labetalol, lidocaine (PF), lidocaine-prilocaine, morphine injection, ondansetron (ZOFRAN) IV, pentafluoroprop-tetrafluoroeth, promethazine, sodium chloride  Assessment/ Plan:  1. Resp distress / pulm infiltrates / fever: improved with uf below edw and levaquin as well as cardiac stenting. Chest pain / acute NSTEMI sp stent to RCA 12/20, per cards- slow recvovery  Challenge EDW 2. ESRD: Reed. HD normally tts via avg  HD  At  Clinic will need lowering of EDW  3. Anemia / CKD: Hgb stabeGiving aranesp- transfused 2 units with hd 12/23  4. MBD / CKD: Ca 8.5 /Phos 3.1 pending, on phoslo 2 with meals  5. HTN/volume: on cozaar and coreg as op, on coreg here only- volume control will help as well- may not need cozaar 6. Hx DM / retinopathy 7. Hx COPD, former smoker 8. Dispo- per cards- hopefully discharge soon       LOS: 6 Theresa [name] W @TODAY @11 :29 AM

## 2013-06-03 NOTE — Discharge Summary (Signed)
CARDIOLOGY DISCHARGE SUMMARY   Patient ID: Theresa [name] MRN: [id number] DOB/AGE: 13-Oct-1955 57 y.o.  Admit date: 05/28/2013 Discharge date: 06/03/2013  PCP: Rica Mast, MD Primary Cardiologist: Dr. Rockey Situ  Primary Discharge Diagnosis: NSTEMI (non-ST elevated myocardial infarction) - s/p 2.5 x 16 Promus DES to the mid RCA  Secondary Discharge Diagnosis:    COPD (chronic obstructive pulmonary disease)   Diabetes mellitus, type II   ESRD on hemodialysis - discharge weight 154 pounds   Diabetic neuropathy   Anemia in chronic kidney disease (CKD)   Hypertension   Hyperlipidemia   Diabetic retinopathy   Acute respiratory failure   Pulmonary infiltrates on CXR   Acute CHF   CAP  Consults: CCM, Nephrology  Procedures: Left heart catheterization with selective coronary angiography, left ventriculogram, hemodialysis, 2-D echocardiogram, BiPAP   Hospital Course: Theresa [name] is a 57 y.o. female with no history of CAD. She was admitted to [facility] with shortness of breath, cough, back pain and chest pain. Her initial troponin was 4. She was taken to dialysis but developed worsening shortness of breath and ST depression. A repeat troponin was 9. She was transferred to [place], and.  Upon arrival time, she was started on BiPAP due to severe shortness of breath. Her chest discomfort improved. Because of the changes in her ECG, and the increasing elevation in her troponin, she was taken to the Cath Lab.  NSTEMI: Cardiac catheterization results are below. She had a stent to the RCA and medical therapy is recommended for other disease including an 80% PDA. She was started on aspirin, her beta blocker was increased, and she was continued on a statin. She is to be on Plavix for at least a year. A 2-D echocardiogram was performed, results are below. Her EF is preserved and she has grade 2 diastolic dysfunction. She was seen by cardiac rehabilitation and ambulated with them, using a  walker. She agrees to outpatient cardiac rehabilitation in [place].  Acute respiratory failure: She initially required BiPAP. Critical care medicine consult was called because she developed hypotension after dialysis. She was transferred to CCU and developed hypertension associated with hypoxia, nausea and vomiting. She was started on Levaquin for possible CAP. Her chest x-ray had bilateral infiltrates concerning for pulmonary edema versus pneumonia. She improved over the course of her hospital stay. She was taken off the BiPAP because of her nausea (increased risk of aspiration).  CAP/pulmonary infiltrates: She was febrile up to 102.6. She was felt to have community-acquired pneumonia on top of [name]. She was initially on ceftriaxone and Levaquin. Her PCR was positive for MRSA but blood cultures are negative so far. She is currently on Levaquin alone and will complete this as an outpatient.  ESRD on HD: She was significantly volume overloaded on admission. Nephrology was consulted and managed her dialysis. Her weight is down 19 pounds from admission. She is to continue dialysis at her pre-admission schedule of Tuesday Thursday Saturday.  Acute diastolic CHF: Volume management was by the nephrology team through dialysis. She is on a beta blocker and ARB.  Hyperlipidemia: She was on Crestor 40 prior to admission. A lipid profile was performed and is reviewed below. No medication changes were indicated and she is to continue high-dose statin at discharge.  Anemia: She has a history of anemia and on admission her hemoglobin was 8.4. However, with blood draws and procedures, her hemoglobin decreased to 6.6 with hematocrit of 20.1. She was transfused, which was managed by  the renal team. Her hemoglobin improved and discharge labs are below.  Diabetes: She had been on detemir insulin prior to admission. She was managed with a combination of her home insulin and sliding scale.  Moderate mitral  regurgitation: Seen on echocardiogram 12/22 and will be followed as an outpatient.  Hypertension: Her medications required adjustment at times because she was hypotensive and in a hypertensive. However, by discharge she is back on her home medications, with an increase in her beta blocker and will continue these at discharge.  By 12/26, she was feeling much better, her respiratory status was improved and she was ambulating without chest pain. She was evaluated by Dr. Angelena Form and all data reviewed. She is considered stable for discharge, to follow up in [place].  Labs:  Lab Results  Component Value Date   WBC 7.7 06/03/2013   HGB 11.1* 06/03/2013   HCT 33.5* 06/03/2013   MCV 97.4 06/03/2013   PLT 240 06/03/2013    Recent Labs Lab 05/29/13 0500  06/03/13 0515  NA 140  < > 136  K 4.0  < > 4.2  CL 99  < > 96  CO2 31  < > 25  BUN 17  < > 28*  CREATININE 4.33*  < > 6.93*  CALCIUM 7.7*  < > 8.4  PROT 5.8*  --   --   BILITOT 0.3  --   --   ALKPHOS 66  --   --   ALT 18  --   --   AST 43*  --   --   GLUCOSE 155*  < > 173*  < > = values in this interval not displayed.  Lab Results  Component Value Date   TROPONINI 13.58* 05/29/2013   Lipid Panel     Component Value Date/Time   CHOL 118 05/31/2013 0538   TRIG 129 05/31/2013 0538   HDL 50 05/31/2013 0538   CHOLHDL 2.4 05/31/2013 0538   VLDL 26 05/31/2013 0538   LDLCALC 42 05/31/2013 0538    Pro B Natriuretic peptide (BNP)  Date/Time Value Range Status  05/28/2013  5:42 PM >70000.0* 0.0 - 100.0 pg/mL Final      Radiology: Dg Chest Port 1 View 05/30/2013   CLINICAL DATA:  Respiratory failure.  EXAM: PORTABLE CHEST - 1 VIEW  COMPARISON:  May 29, 2013.  FINDINGS: The pulmonary interstitial markings remain increased and are confluent in areas. The hemidiaphragms are better demonstrated today. The cardiopericardial silhouette rim of is remains mildly enlarged and the pulmonary vascularity is engorged. No significant  pleural effusion is demonstrated today. There is no pneumothorax. The mediastinum is normal in width.  IMPRESSION: There has been slight interval improvement in the appearance of the pulmonary interstitium. At both lung bases the suspected subsegmental atelectasis has largely cleared.   Electronically Signed   By: David  Martinique   On: 05/30/2013 07:34   Dg Chest Port 1 View 05/29/2013   CLINICAL DATA:  Expiratory distress  EXAM: PORTABLE CHEST - 1 VIEW  COMPARISON:  Chest x-ray from 1 day prior  FINDINGS: Increasingly dense lower lung opacities. Where not obscured, the cardiomediastinal silhouette is similar in size and morphology. The opacities have become more confluent and symmetric, favoring edema. No pneumothorax.  IMPRESSION: Increasingly confluent bilateral opacities, which favor edema over pneumonia.   Electronically Signed   By: Jorje Guild M.D.   On: 05/29/2013 00:35   Dg Chest Port 1 View 05/28/2013   CLINICAL DATA:  Assess edema. Rule  out pneumonia. Dialysis patient.  EXAM: PORTABLE CHEST - 1 VIEW  COMPARISON:  01/13/2012  FINDINGS: There is bilateral airspace disease which is somewhat patchy in the upper lung stones, especially on the left. The shape of the left diaphragm suggest a sub pulmonic effusion. Normal heart size for technique. No pneumothorax.  IMPRESSION: 1. Bilateral airspace disease which could be noncardiogenic edema or pneumonia. Moderate symmetry and dialysis history favors edema. 2. Question small subpulmonic effusion on the left.   Electronically Signed   By: Jorje Guild M.D.   On: 05/28/2013 22:28    Cardiac Cath: 05/28/2013 ANGIOGRAPHIC DATA: The left main coronary artery is widely patent proximally. There is a 20% distal left main lesion.  The left anterior descending artery is a large vessel. THere is mild, diffuse atherosclerosis throughout the vessel. There are several medium sized diagonals which are widely patent.  The left circumflex artery is a small vessel  which is patent. There is a medium sized OM1 which is widely patent.  The right coronary artery is a large dominant vessel. There is a 95% mid vessel stenosis with TIMI 3 flow on the initial angiogram. There is a medium size posterolateral artery. There appear to be a dual PDA arteries. The first one, had a proximal 80% stenosis with disease extending to the ostium.  LEFT VENTRICULOGRAM: Left ventricular angiogram was not done. LVEDP was 30 mmHg. IMPRESSIONS:  1. Patent left main coronary artery. 2. Patent left anterior descending artery and its branches. 3. Patent left circumflex artery and its branches. 4. 95% mid vessel stenosis of the right coronary artery. This was successfully stented with a 2.5 x 16 Promus drug eluting stent post dilated to 2.78 mm. 5. LVEDP 30 mmHg.  RECOMMENDATION: Echo to evaluate left ventricular systolic function. Pulmonary consult called in to Dr. Melvyn Novas. Will not start antibiotics at this time although pneumonia was presumed by [facility] at [facility]. Call Nephrology for likely dialysis today. Elevated LVEDP indicating fluid overload. She will need aspirin and Plavix for at least a year. She needs aggressive secondary prevention.  EKG: 05/29/2013 Sinus rhythm Vent. rate 99 BPM PR interval 130 ms QRS duration 84 ms QT/QTc 346/444 ms P-R-T axes 59 52 144  Echo: 22 2014 Conclusions - Left ventricle: The cavity size was normal. Systolic function was normal. There is severe hypokinesis of the basalinferior and inferoseptal myocardium. Features are consistent with a pseudonormal left ventricular filling pattern, with concomitant abnormal relaxation and increased filling pressure (grade 2 diastolic dysfunction). - Mitral valve: Moderate regurgitation. - Left atrium: The atrium was mildly dilated. - Pulmonary arteries: PA peak pressure: 68mm Hg (S).   FOLLOW UP PLANS AND APPOINTMENTS Allergies  Allergen Reactions  . Hydrocodone      Medication List          acetaminophen-codeine 300-30 MG per tablet  Commonly known as:  TYLENOL #3  Take 1 tablet by mouth every 4 (four) hours as needed.     aspirin EC 81 MG tablet  Take 81 mg by mouth daily.     carvedilol 12.5 MG tablet  Commonly known as:  COREG  Take 1 tablet (12.5 mg total) by mouth 2 (two) times daily.     clopidogrel 75 MG tablet  Commonly known as:  PLAVIX  Take 1 tablet (75 mg total) by mouth daily.     furosemide 40 MG tablet  Commonly known as:  LASIX  Take 1 tablet (40 mg total) by mouth daily.  Insulin Detemir 100 UNIT/ML Sopn  Inject 20 Units into the skin daily.     Insulin Pen Needle 32G X 6 MM Misc  1 Units by Does not apply route as needed.     levofloxacin 500 MG tablet  Commonly known as:  LEVAQUIN  Take 1 tablet (500 mg total) by mouth every other day.     losartan 25 MG tablet  Commonly known as:  COZAAR  Take 4 tablets (100 mg total) by mouth daily.     NASONEX 50 MCG/ACT nasal spray  Generic drug:  mometasone  Place 2 sprays into the nose daily. 2 sprays by Each Nare route daily as needed.     nitroGLYCERIN 0.4 MG SL tablet  Commonly known as:  NITROSTAT  Place 1 tablet (0.4 mg total) under the tongue every 5 (five) minutes as needed for chest pain.     pregabalin 100 MG capsule  Commonly known as:  LYRICA  Take 100 mg by mouth daily.     pregabalin 50 MG capsule  Commonly known as:  LYRICA  Take 50 mg by mouth daily.     PROAIR HFA 108 (90 BASE) MCG/ACT inhaler  Generic drug:  albuterol  Inhale 1 puff into the lungs every 4 (four) hours as needed.     rosuvastatin 40 MG tablet  Commonly known as:  CRESTOR  Take 1 tablet (40 mg total) by mouth daily.     sertraline 50 MG tablet  Commonly known as:  ZOLOFT  Take 1 tablet (50 mg total) by mouth daily.        Discharge Orders   Future Appointments Provider Department Dept Phone   06/16/2013 1:00 PM [name], MD [place] [phone]   06/17/2013 8:15  AM Minna Merritts, MD [facility] [phone]   Future Orders Complete By Expires   (HEART FAILURE PATIENTS) Call MD:  Anytime you have any of the following symptoms: 1) 3 pound weight gain in 24 hours or 5 pounds in 1 week 2) shortness of breath, with or without a dry hacking cough 3) swelling in the hands, feet or stomach 4) if you have to sleep on extra pillows at night in order to breathe.  As directed    Amb Referral to Cardiac Rehabilitation  As directed    Comments:     Pt agrees to [place]. CRP in [place], will send referral.   Diet - low sodium heart healthy  As directed    Diet Carb Modified  As directed    Increase activity slowly  As directed      Follow-up Information   Follow up with Ida Rogue, MD On 06/17/2013. (at 8:15 am)    Specialty:  Cardiology   Contact information:   [address] [place] [address] [phone]       BRING ALL MEDICATIONS WITH YOU TO FOLLOW UP APPOINTMENTS  Time spent with patient to include physician time: 43 min Signed: Rosaria Ferries, PA-C 06/03/2013, 9:10 AM Co-Sign MD

## 2013-06-03 NOTE — Progress Notes (Signed)
CARDIAC REHAB PHASE I   PRE:  Rate/Rhythm: 77 SR  BP:  Sitting: 154/54     SaO2: 94 RA  MODE:  Ambulation: 300 ft   POST:  Rate/Rhythm: 86 SR  BP:  Sitting: 151/59    SaO2: 94 RA  Pt walked 300 ft without RW and assist x1.  Pt had rest x1 and c/o of mild dizziness.  Pt had steady gait and stated she did not have any questions at this time.  [id number]  Lillia Dallas MS, ACSM RCEP 8:58 AM 06/03/2013

## 2013-06-04 LAB — CULTURE, BLOOD (ROUTINE X 2)
Culture: NO GROWTH
Culture: NO GROWTH

## 2013-06-14 ENCOUNTER — Inpatient Hospital Stay: Payer: Self-pay | Admitting: Internal Medicine

## 2013-06-14 LAB — BASIC METABOLIC PANEL
Anion Gap: 3 — ABNORMAL LOW (ref 7–16)
BUN: 16 mg/dL (ref 7–18)
CO2: 32 mmol/L (ref 21–32)
Calcium, Total: 8.3 mg/dL — ABNORMAL LOW (ref 8.5–10.1)
Chloride: 97 mmol/L — ABNORMAL LOW (ref 98–107)
Creatinine: 4.65 mg/dL — ABNORMAL HIGH (ref 0.60–1.30)
EGFR (African American): 11 — ABNORMAL LOW
EGFR (Non-African Amer.): 10 — ABNORMAL LOW
GLUCOSE: 99 mg/dL (ref 65–99)
Osmolality: 266 (ref 275–301)
Potassium: 3.3 mmol/L — ABNORMAL LOW (ref 3.5–5.1)
SODIUM: 132 mmol/L — AB (ref 136–145)

## 2013-06-14 LAB — CBC
HCT: 36.5 % (ref 35.0–47.0)
HGB: 12.1 g/dL (ref 12.0–16.0)
MCH: 32.6 pg (ref 26.0–34.0)
MCHC: 33.2 g/dL (ref 32.0–36.0)
MCV: 98 fL (ref 80–100)
PLATELETS: 173 10*3/uL (ref 150–440)
RBC: 3.72 10*6/uL — AB (ref 3.80–5.20)
RDW: 18.2 % — ABNORMAL HIGH (ref 11.5–14.5)
WBC: 6.5 10*3/uL (ref 3.6–11.0)

## 2013-06-14 LAB — TROPONIN I: Troponin-I: 0.03 ng/mL

## 2013-06-14 LAB — HEPATIC FUNCTION PANEL A (ARMC)
ALBUMIN: 2.6 g/dL — AB (ref 3.4–5.0)
ALT: 22 U/L (ref 12–78)
AST: 30 U/L (ref 15–37)
Alkaline Phosphatase: 95 U/L
BILIRUBIN DIRECT: 0.1 mg/dL (ref 0.00–0.20)
Bilirubin,Total: 0.6 mg/dL (ref 0.2–1.0)
TOTAL PROTEIN: 6.9 g/dL (ref 6.4–8.2)

## 2013-06-14 LAB — LIPASE, BLOOD: LIPASE: 107 U/L (ref 73–393)

## 2013-06-14 LAB — CLOSTRIDIUM DIFFICILE(ARMC)

## 2013-06-15 LAB — CBC WITH DIFFERENTIAL/PLATELET
BASOS ABS: 0 10*3/uL (ref 0.0–0.1)
Basophil %: 0.5 %
EOS PCT: 3.3 %
Eosinophil #: 0.1 10*3/uL (ref 0.0–0.7)
HCT: 31.4 % — AB (ref 35.0–47.0)
HGB: 10.4 g/dL — AB (ref 12.0–16.0)
LYMPHS PCT: 30.7 %
Lymphocyte #: 1.3 10*3/uL (ref 1.0–3.6)
MCH: 32.5 pg (ref 26.0–34.0)
MCHC: 33.1 g/dL (ref 32.0–36.0)
MCV: 98 fL (ref 80–100)
MONOS PCT: 15 %
Monocyte #: 0.6 x10 3/mm (ref 0.2–0.9)
NEUTROS PCT: 50.5 %
Neutrophil #: 2.1 10*3/uL (ref 1.4–6.5)
PLATELETS: 145 10*3/uL — AB (ref 150–440)
RBC: 3.2 10*6/uL — ABNORMAL LOW (ref 3.80–5.20)
RDW: 18.3 % — ABNORMAL HIGH (ref 11.5–14.5)
WBC: 4.1 10*3/uL (ref 3.6–11.0)

## 2013-06-15 LAB — MAGNESIUM: Magnesium: 1.8 mg/dL

## 2013-06-15 LAB — TSH: Thyroid Stimulating Horm: 1.19 u[IU]/mL

## 2013-06-15 LAB — HEMOGLOBIN A1C: Hemoglobin A1C: 5.7 % (ref 4.2–6.3)

## 2013-06-16 ENCOUNTER — Encounter (INDEPENDENT_AMBULATORY_CARE_PROVIDER_SITE_OTHER): Payer: BC Managed Care – PPO | Admitting: Ophthalmology

## 2013-06-16 DIAGNOSIS — R079 Chest pain, unspecified: Secondary | ICD-10-CM

## 2013-06-16 LAB — CK-MB: CK-MB: 1.9 ng/mL (ref 0.5–3.6)

## 2013-06-16 LAB — PHOSPHORUS: Phosphorus: 7.8 mg/dL — ABNORMAL HIGH (ref 2.5–4.9)

## 2013-06-16 LAB — TROPONIN I: Troponin-I: 0.03 ng/mL

## 2013-06-17 ENCOUNTER — Telehealth: Payer: Self-pay | Admitting: Internal Medicine

## 2013-06-17 ENCOUNTER — Ambulatory Visit: Payer: BC Managed Care – PPO | Admitting: Cardiovascular Disease

## 2013-06-17 LAB — STOOL CULTURE

## 2013-06-17 NOTE — Telephone Encounter (Signed)
Pt called to get hospital follow up  She was at [facility] discharged 06/17/13 Please advise a date and time for ms [place]

## 2013-06-17 NOTE — Telephone Encounter (Signed)
Is there anyway you can suggest we put her in at?

## 2013-06-17 NOTE — Telephone Encounter (Signed)
We can see her on 1/12 at 1:15? Or 1/19 at 1:30?

## 2013-06-20 ENCOUNTER — Ambulatory Visit: Payer: BC Managed Care – PPO | Admitting: Internal Medicine

## 2013-06-20 NOTE — Telephone Encounter (Signed)
Appointment was scheduled.

## 2013-06-22 ENCOUNTER — Ambulatory Visit: Payer: BC Managed Care – PPO | Admitting: Cardiovascular Disease

## 2013-06-23 ENCOUNTER — Encounter: Payer: Self-pay | Admitting: *Deleted

## 2013-06-23 ENCOUNTER — Ambulatory Visit: Payer: BC Managed Care – PPO | Admitting: Internal Medicine

## 2013-06-27 ENCOUNTER — Ambulatory Visit: Payer: BC Managed Care – PPO | Admitting: Internal Medicine

## 2013-06-27 ENCOUNTER — Ambulatory Visit (INDEPENDENT_AMBULATORY_CARE_PROVIDER_SITE_OTHER): Payer: BC Managed Care – PPO | Admitting: Internal Medicine

## 2013-06-27 ENCOUNTER — Encounter: Payer: Self-pay | Admitting: Internal Medicine

## 2013-06-27 VITALS — BP 160/80 | HR 87 | Temp 98.7°F | Wt 170.0 lb

## 2013-06-27 DIAGNOSIS — I2510 Atherosclerotic heart disease of native coronary artery without angina pectoris: Secondary | ICD-10-CM

## 2013-06-27 DIAGNOSIS — B9689 Other specified bacterial agents as the cause of diseases classified elsewhere: Secondary | ICD-10-CM

## 2013-06-27 DIAGNOSIS — N186 End stage renal disease: Secondary | ICD-10-CM

## 2013-06-27 DIAGNOSIS — I25118 Atherosclerotic heart disease of native coronary artery with other forms of angina pectoris: Secondary | ICD-10-CM | POA: Insufficient documentation

## 2013-06-27 DIAGNOSIS — A499 Bacterial infection, unspecified: Secondary | ICD-10-CM

## 2013-06-27 DIAGNOSIS — R109 Unspecified abdominal pain: Secondary | ICD-10-CM

## 2013-06-27 DIAGNOSIS — A498 Other bacterial infections of unspecified site: Secondary | ICD-10-CM

## 2013-06-27 DIAGNOSIS — Z992 Dependence on renal dialysis: Secondary | ICD-10-CM

## 2013-06-27 HISTORY — DX: Atherosclerotic heart disease of native coronary artery with other forms of angina pectoris: I25.118

## 2013-06-27 MED ORDER — HYOSCYAMINE SULFATE 0.125 MG SL SUBL: 0.1250 mg | SUBLINGUAL_TABLET | Freq: Three times a day (TID) | SUBLINGUAL | Status: DC | PRN

## 2013-06-27 NOTE — Assessment & Plan Note (Signed)
Continues on HD. Appears euvolemic on exam today. Will request notes on recent electrolytes from [place].

## 2013-06-27 NOTE — Progress Notes (Signed)
Subjective:    Patient ID: Theresa [name], female    DOB: 1955-08-25, 58 y.o.   MRN: [id number]  HPI  58YO female with ESRD on HD, diabetes, hyperlipidemia, CAD s/p DES to RCA in 05/2013 presents for hospital follow up.  Initially, admitted with NSTEMI, respiratory failure 05/2013. Cath showed 95% occlusion RCA, DES placed. ECHO at that time showed severe hypokinesis of the basalinferior and inferoseptal myocardium. Symptoms improved after stent placement. Pt ultimately discharged, however readmitted to [facility] 06/13/2013 with diarrhea. Diagnosed with C.Diff. Treated with Flagyl, and continues on this medication. Continues to have bowel incontinence and lower diffuse abdominal pain. No blood in stool. No fever. Notes persistent extreme fatigue. No chest pain, dyspnea. Notes that BP has been low with HD.  Outpatient Encounter Prescriptions as of 06/27/2013  Medication Sig  . albuterol (PROAIR HFA) 108 (90 BASE) MCG/ACT inhaler Inhale 1 puff into the lungs every 4 (four) hours as needed.   Marland Kitchen aspirin EC 81 MG tablet Take 81 mg by mouth daily.  . carvedilol (COREG) 12.5 MG tablet Take 1 tablet (12.5 mg total) by mouth 2 (two) times daily.  . clopidogrel (PLAVIX) 75 MG tablet Take 1 tablet (75 mg total) by mouth daily.  . Insulin Detemir 100 UNIT/ML SOPN Inject 20 Units into the skin daily.  . Insulin Pen Needle 32G X 6 MM MISC 1 Units by Does not apply route as needed.  Marland Kitchen losartan (COZAAR) 25 MG tablet Take 4 tablets (100 mg total) by mouth daily.  . mometasone (NASONEX) 50 MCG/ACT nasal spray Place 2 sprays into the nose daily. 2 sprays by Each Nare route daily as needed.  . nitroGLYCERIN (NITROSTAT) 0.4 MG SL tablet Place 1 tablet (0.4 mg total) under the tongue every 5 (five) minutes as needed for chest pain.  . pregabalin (LYRICA) 100 MG capsule Take 100 mg by mouth daily.  . pregabalin (LYRICA) 50 MG capsule Take 50 mg by mouth daily.  . rosuvastatin (CRESTOR) 40 MG tablet Take 1 tablet (40 mg  total) by mouth daily.  . sertraline (ZOLOFT) 50 MG tablet Take 1 tablet (50 mg total) by mouth daily.  Marland Kitchen acetaminophen-codeine (TYLENOL #3) 300-30 MG per tablet Take 1 tablet by mouth every 4 (four) hours as needed.   . furosemide (LASIX) 40 MG tablet Take 1 tablet (40 mg total) by mouth daily.     Review of Systems  Constitutional: Positive for fatigue. Negative for fever, chills, appetite change and unexpected weight change.  HENT: Negative for congestion, ear pain, sinus pressure, sore throat, trouble swallowing and voice change.   Eyes: Negative for visual disturbance.  Respiratory: Negative for cough, shortness of breath, wheezing and stridor.   Cardiovascular: Negative for chest pain, palpitations and leg swelling.  Gastrointestinal: Positive for abdominal pain (diffuse). Negative for nausea, vomiting, diarrhea (no watery diarrhea, however has leakage of soft stool around rectum), constipation, blood in stool, abdominal distention and anal bleeding.  Genitourinary: Negative for dysuria and flank pain.  Musculoskeletal: Negative for arthralgias, gait problem, myalgias and neck pain.  Skin: Negative for color change and rash.  Neurological: Negative for dizziness and headaches.  Hematological: Negative for adenopathy. Does not bruise/bleed easily.  Psychiatric/Behavioral: Negative for suicidal ideas, sleep disturbance and dysphoric mood. The patient is not nervous/anxious.    BP 160/80  Pulse 87  Temp(Src) 98.7 F (37.1 C) (Oral)  Wt 170 lb (77.111 kg)  SpO2 96%     Objective:   Physical Exam  Constitutional:  She is oriented to person, place, and time. She appears well-developed and well-nourished. No distress.  HENT:  Head: Normocephalic and atraumatic.  Right Ear: External ear normal.  Left Ear: External ear normal.  Nose: Nose normal.  Mouth/Throat: Oropharynx is clear and moist. No oropharyngeal exudate.  Eyes: Conjunctivae are normal. Pupils are equal, round, and  reactive to light. Right eye exhibits no discharge. Left eye exhibits no discharge. No scleral icterus.  Neck: Normal range of motion. Neck supple. No tracheal deviation present. No thyromegaly present.  Cardiovascular: Normal rate, regular rhythm, normal heart sounds and intact distal pulses.  Exam reveals no gallop and no friction rub.   No murmur heard. Pulmonary/Chest: Effort normal and breath sounds normal. No accessory muscle usage. Not tachypneic. No respiratory distress. She has no decreased breath sounds. She has no wheezes. She has no rhonchi. She has no rales. She exhibits no tenderness.  Abdominal: Soft. Bowel sounds are increased. There is tenderness (diffuse mild).  Musculoskeletal: Normal range of motion. She exhibits no edema and no tenderness.  Lymphadenopathy:    She has no cervical adenopathy.  Neurological: She is alert and oriented to person, place, and time. No cranial nerve deficit. She exhibits normal muscle tone. Coordination normal.  Skin: Skin is warm and dry. No rash noted. She is not diaphoretic. No erythema. No pallor.  Psychiatric: She has a normal mood and affect. Her behavior is normal. Judgment and thought content normal.          Assessment & Plan:

## 2013-06-27 NOTE — Assessment & Plan Note (Signed)
Recently hospitalized at [facility] 05/2013 with NSTEMI. Notes reviewed today. No recent symptoms of chest pain, however continues to have fatigue. ECHO 05/2013 showed severe hypokinesis of the basalinferior and inferoseptal myocardium. May ultimately need repeat ECHO if symptoms of fatigue persistent. Follow up with Dr. Rockey Situ scheduled for 1/30.

## 2013-06-27 NOTE — Progress Notes (Signed)
Pre-visit discussion using our clinic review tool. No additional management support is needed unless otherwise documented below in the visit note.  

## 2013-06-27 NOTE — Assessment & Plan Note (Signed)
Symptoms are concerning for persistent CDiff infection despite treatment with Flagyl. Will send stool for culture and CDiff toxin testing. Will set up follow up with GI. Question if she might be a candidate for stool transplant.

## 2013-06-28 ENCOUNTER — Telehealth: Payer: Self-pay | Admitting: Internal Medicine

## 2013-06-28 LAB — CLOSTRIDIUM DIFFICILE EIA: CDIFTX: NEGATIVE

## 2013-06-28 LAB — H. PYLORI BREATH TEST: H. pylori Breath Test: NEGATIVE

## 2013-06-28 NOTE — Telephone Encounter (Signed)
We are trying to set her up with [facility]. We were unable to refer her to [place], because she was discharged form that clinic. If she has severe or worsening abdominal pain, she will need to be evaluated in the ED. Amber - Can you make sure GI has this referral?

## 2013-06-28 NOTE — Telephone Encounter (Signed)
I LVM for patient to call our office today at 840am I just spoke with patient about the issues with her being dismissed from [facility], informed her she needs to gather records from any GI doctor she has visited and bring them to our office so I can proceed in faxing those for MD review @ [facility]

## 2013-06-28 NOTE — Telephone Encounter (Signed)
Patient Information:  Caller Name: Theresa [name]  Phone: [phone]  Patient: Theresa [name]  Gender: Female  DOB: November 20, 1955  Age: 58 Years  PCP: Ronette Deter (Adults only)  Pregnant: No  Office Follow Up:  Does the office need to follow up with this patient?: Yes  Instructions For The Office: Patient calling about lab studies, GI referral and what to do in meantime before getting a GI referral.  PLEASE CONTACT.  RN Note:  Patient calling about lab studies, GI referral and what to do in meantime before getting a GI referral.  PLEASE CONTACT.  Symptoms  Reason For Call & Symptoms: Patient states she was seen in the office yesterday 06/27/13 by Dr. Gilford Rile,  for follow up from hospitalization for C-diff.  She states she had stool culture yesterday. Reviewed EPIC  "Cdiff was negative but Dr. Gilford Rile wants her to see GI".  She states she is going to bathroom in her clothes because she cannot get to [facility] in time.  Stools today x5, loose, brown yellow. Abdominal discomfort "at very bottom".  Afebrile. no vomiting.  PATIENT DOES NOT HAVE GI PHYSICIAN- WHO DOES DR. Gilford Rile WANT HER TO SEE?  WHAT DOES SHE DO IN MEANTIME.  Reviewed Health History In EMR: Yes  Reviewed Medications In EMR: Yes  Reviewed Allergies In EMR: Yes  Reviewed Surgeries / Procedures: Yes  Date of Onset of Symptoms: 06/27/2013  Treatments Tried: Kaopectate on Saturday 06/25/13 but "it does not work"  Treatments Tried Worked: No OB / GYN:  LMP: Unknown  Guideline(s) Used:  Diarrhea  Disposition Per Guideline:   See Today in Office  Reason For Disposition Reached:   Abdominal pain (Exception: pain clears completely with each passage of diarrhea stool)  Advice Given:  Fluids:  For example: sports drinks, diluted fruit juices, soft drinks.  Avoid caffeinated beverages (Reason: caffeine is mildly dehydrating).  Nutrition:  Maintaining some food intake during episodes of diarrhea is important.  Ideal initial foods include  boiled starches/cereals (e.g., potatoes, rice, noodles, wheat, oats) with a small amount of salt to taste.  Other acceptable foods include: bananas, yogurt, crackers, soup.  As your stools return to normal consistency, resume a normal diet.  Call Back If:  You become worse.  RN Overrode Recommendation:  Document Patient  Patient calling about lab studies, GI referral and what to do in meantime before getting a GI referral.  PLEASE CONTACT.

## 2013-06-28 NOTE — Telephone Encounter (Signed)
Please advise 

## 2013-06-28 NOTE — Telephone Encounter (Signed)
Pt calling, states she was seen yesterday.  States her bowels are still moving really bad and her back is hurting really bad starting today.  Pt asking if she needs to go to hospital.  States she is miserable.  Also would like results of stool samples taken yesterday.  Please call pt.  Transferred to [place].

## 2013-07-01 LAB — STOOL CULTURE

## 2013-07-04 ENCOUNTER — Encounter (INDEPENDENT_AMBULATORY_CARE_PROVIDER_SITE_OTHER): Payer: BC Managed Care – PPO | Admitting: Ophthalmology

## 2013-07-04 DIAGNOSIS — H2700 Aphakia, unspecified eye: Secondary | ICD-10-CM

## 2013-07-04 DIAGNOSIS — H26499 Other secondary cataract, unspecified eye: Secondary | ICD-10-CM

## 2013-07-05 ENCOUNTER — Emergency Department: Payer: Self-pay | Admitting: Emergency Medicine

## 2013-07-05 LAB — CBC WITH DIFFERENTIAL/PLATELET
Basophil #: 0.1 10*3/uL (ref 0.0–0.1)
Basophil %: 0.8 %
Eosinophil #: 0.5 10*3/uL (ref 0.0–0.7)
Eosinophil %: 4.6 %
HCT: 34.7 % — ABNORMAL LOW (ref 35.0–47.0)
HGB: 11.6 g/dL — ABNORMAL LOW (ref 12.0–16.0)
LYMPHS PCT: 19.3 %
Lymphocyte #: 2 10*3/uL (ref 1.0–3.6)
MCH: 33 pg (ref 26.0–34.0)
MCHC: 33.5 g/dL (ref 32.0–36.0)
MCV: 98 fL (ref 80–100)
MONO ABS: 0.5 x10 3/mm (ref 0.2–0.9)
Monocyte %: 4.8 %
NEUTROS ABS: 7.4 10*3/uL — AB (ref 1.4–6.5)
Neutrophil %: 70.5 %
PLATELETS: 178 10*3/uL (ref 150–440)
RBC: 3.53 10*6/uL — ABNORMAL LOW (ref 3.80–5.20)
RDW: 17.4 % — ABNORMAL HIGH (ref 11.5–14.5)
WBC: 10.4 10*3/uL (ref 3.6–11.0)

## 2013-07-05 LAB — BASIC METABOLIC PANEL
Anion Gap: 5 — ABNORMAL LOW (ref 7–16)
BUN: 26 mg/dL — AB (ref 7–18)
CREATININE: 6.13 mg/dL — AB (ref 0.60–1.30)
Calcium, Total: 8.6 mg/dL (ref 8.5–10.1)
Chloride: 99 mmol/L (ref 98–107)
Co2: 29 mmol/L (ref 21–32)
GFR CALC AF AMER: 8 — AB
GFR CALC NON AF AMER: 7 — AB
Glucose: 168 mg/dL — ABNORMAL HIGH (ref 65–99)
Osmolality: 275 (ref 275–301)
Potassium: 6.2 mmol/L — ABNORMAL HIGH (ref 3.5–5.1)
SODIUM: 133 mmol/L — AB (ref 136–145)

## 2013-07-05 LAB — POTASSIUM: POTASSIUM: 4.5 mmol/L (ref 3.5–5.1)

## 2013-07-07 NOTE — Telephone Encounter (Signed)
The patient was supposed to bring records to our office so I can fax. I have not gotten those yet.

## 2013-07-07 NOTE — Telephone Encounter (Signed)
Please refer to lab encounter for further information

## 2013-07-07 NOTE — Telephone Encounter (Signed)
Did you ever hear anything back in reference to this?

## 2013-07-08 ENCOUNTER — Encounter: Payer: Self-pay | Admitting: Cardiovascular Disease

## 2013-07-08 ENCOUNTER — Ambulatory Visit (INDEPENDENT_AMBULATORY_CARE_PROVIDER_SITE_OTHER): Payer: BC Managed Care – PPO | Admitting: Cardiovascular Disease

## 2013-07-08 ENCOUNTER — Encounter: Payer: Self-pay | Admitting: Gastroenterology

## 2013-07-08 VITALS — BP 138/62 | HR 80 | Ht 67.5 in | Wt 165.8 lb

## 2013-07-08 DIAGNOSIS — E119 Type 2 diabetes mellitus without complications: Secondary | ICD-10-CM

## 2013-07-08 DIAGNOSIS — A498 Other bacterial infections of unspecified site: Secondary | ICD-10-CM

## 2013-07-08 DIAGNOSIS — E785 Hyperlipidemia, unspecified: Secondary | ICD-10-CM

## 2013-07-08 DIAGNOSIS — I214 Non-ST elevation (NSTEMI) myocardial infarction: Secondary | ICD-10-CM

## 2013-07-08 DIAGNOSIS — B9689 Other specified bacterial agents as the cause of diseases classified elsewhere: Secondary | ICD-10-CM

## 2013-07-08 DIAGNOSIS — I10 Essential (primary) hypertension: Secondary | ICD-10-CM

## 2013-07-08 DIAGNOSIS — N186 End stage renal disease: Secondary | ICD-10-CM

## 2013-07-08 DIAGNOSIS — Z992 Dependence on renal dialysis: Secondary | ICD-10-CM

## 2013-07-08 DIAGNOSIS — A499 Bacterial infection, unspecified: Secondary | ICD-10-CM

## 2013-07-08 NOTE — Assessment & Plan Note (Signed)
Blood pressure is well controlled on today's visit. No changes made to the medications. 

## 2013-07-08 NOTE — Assessment & Plan Note (Signed)
We have encouraged continued exercise, careful diet management in an effort to lose weight. She reports hemoglobin A1c well controlled

## 2013-07-08 NOTE — Assessment & Plan Note (Signed)
We'll continue Crestor daily. Goal LDL less than 70

## 2013-07-08 NOTE — Patient Instructions (Signed)
You are doing well. No medication changes were made.  Please call us if you have new issues that need to be addressed before your next appt.  Your physician wants you to follow-up in: 6 months.  You will receive a reminder letter in the mail two months in advance. If you don't receive a letter, please call our office to schedule the follow-up appointment.   

## 2013-07-08 NOTE — Assessment & Plan Note (Signed)
She reports that she is only renal transplant list at [facility].

## 2013-07-08 NOTE — Assessment & Plan Note (Signed)
Completed 10 day course of Flagyl. Hospitalization earlier in January

## 2013-07-08 NOTE — Assessment & Plan Note (Signed)
Doing well following her stent to the mid RCA in December 2014. We'll continue aspirin, Plavix.

## 2013-07-08 NOTE — Progress Notes (Signed)
Patient ID: Theresa [name], female    DOB: Oct 12, 1955, 58 y.o.   MRN: [id number]  HPI Comments: Theresa [name] is a pleasant 81 -year daily quickly-old woman with 25 years of smoking, diabetes, chronic renal insufficiency,  previously noted to have moderate to severe mitral valve regurgitation on prior echocardiogram (improved to mild MR on recent echo), pneumonia in June of 2012 with admission overnight to [facility] who initially presented by referral from Dr. Candiss Norse (renal) for evaluation of her ejection fraction which is 45% and mitral valve disease.  on dialysis since February 2014, 3 days per week. She has 40-59% bilateral carotid arterial disease as of September 2014    in followup today, we discussed her recent hospital admission for pneumonia 05/28/2013, elevated cardiac enzymes, transferred to [place] for cardiac catheterization. She had stent placed for 95% mid RCA lesion. She had 2.5 x 16 mm Promus stent placed, post dilated up to 2.78 mm . Since then she reports she is doing well with no further chest pain or shortness of breath . She is on the renal transplant list but reports this will require her to be on the list for 5 years before transplant eligibility   Readmission to the hospital 06/14/2013 for C. difficile colitis. Discharge on 06/16/2013 C. difficile was indigent positive, toxin negative. She was treated with Flagyl for 10 days  Interestingly Stress test at [facility] August 2014 was essentially normal with normal ejection fraction (there was mention of very small mild region of artifact) Echocardiogram at [facility] July 2014 showed ejection fraction greater than 55%, mild MR  She sees Dr. Candiss Norse  of [place] [facility] .  previous  AV graft on 02/23/2013. Graft was thrombosed. Status post revision   Echocardiogram on November 10, 2010 shows mildly dilated left ventricle, ejection fraction 45-50%, mild MS, moderate to severe mitral regurg, mild to moderate tricuspid regurg.   EKG shows normal sinus  rhythm with rate 78 beats per minute with no significant ST or T wave changes    Outpatient Encounter Prescriptions as of 07/08/2013  Medication Sig  . albuterol (PROAIR HFA) 108 (90 BASE) MCG/ACT inhaler Inhale 1 puff into the lungs every 4 (four) hours as needed.   Marland Kitchen aspirin EC 81 MG tablet Take 81 mg by mouth daily.  . carvedilol (COREG) 12.5 MG tablet Take 1 tablet (12.5 mg total) by mouth 2 (two) times daily.  . clopidogrel (PLAVIX) 75 MG tablet Take 1 tablet (75 mg total) by mouth daily.  . furosemide (LASIX) 40 MG tablet Take 1 tablet (40 mg total) by mouth daily.  . hyoscyamine (LEVSIN/SL) 0.125 MG SL tablet Place 1 tablet (0.125 mg total) under the tongue 3 (three) times daily as needed.  . Insulin Detemir 100 UNIT/ML SOPN Inject 20 Units into the skin daily.  . Insulin Pen Needle 32G X 6 MM MISC 1 Units by Does not apply route as needed.  . mometasone (NASONEX) 50 MCG/ACT nasal spray Place 2 sprays into the nose daily. 2 sprays by Each Nare route daily as needed.  . nitroGLYCERIN (NITROSTAT) 0.4 MG SL tablet Place 1 tablet (0.4 mg total) under the tongue every 5 (five) minutes as needed for chest pain.  . pregabalin (LYRICA) 100 MG capsule Take 100 mg by mouth daily.  . rosuvastatin (CRESTOR) 40 MG tablet Take 1 tablet (40 mg total) by mouth daily.   Review of Systems  Constitutional: Negative.   HENT: Negative.   Eyes: Negative.   Respiratory: Negative.  Cardiovascular: Negative.   Gastrointestinal: Negative.   Endocrine: Negative.   Musculoskeletal: Negative.   Skin: Negative.   Allergic/Immunologic: Negative.   Neurological: Negative.   Hematological: Negative.   Psychiatric/Behavioral: Negative.   All other systems reviewed and are negative.   BP 138/62  Pulse 80  Ht 5' 7.5" (1.715 m)  Wt 165 lb 12 oz (75.184 kg)  BMI 25.56 kg/m2  Physical Exam  Nursing note and vitals reviewed. Constitutional: She is oriented to person, place, and time. She appears  well-developed and well-nourished.  AV graft on her left arm  HENT:  Head: Normocephalic.  Nose: Nose normal.  Mouth/Throat: Oropharynx is clear and moist.  Eyes: Conjunctivae are normal. Pupils are equal, round, and reactive to light.  Neck: Normal range of motion. Neck supple. No JVD present. Carotid bruit is present.  Cardiovascular: Normal rate, regular rhythm, S1 normal, S2 normal and intact distal pulses.  Exam reveals no gallop and no friction rub.   Murmur heard.  Crescendo systolic murmur is present with a grade of 2/6  Pulmonary/Chest: Effort normal and breath sounds normal. No respiratory distress. She has no wheezes. She has no rales. She exhibits no tenderness.  Abdominal: Soft. Bowel sounds are normal. She exhibits no distension. There is no tenderness.  Musculoskeletal: Normal range of motion. She exhibits no edema and no tenderness.  Lymphadenopathy:    She has no cervical adenopathy.  Neurological: She is alert and oriented to person, place, and time. Coordination normal.  Skin: Skin is warm and dry. No rash noted. No erythema.  Psychiatric: She has a normal mood and affect. Her behavior is normal. Judgment and thought content normal.    Assessment and Plan

## 2013-07-11 ENCOUNTER — Encounter (INDEPENDENT_AMBULATORY_CARE_PROVIDER_SITE_OTHER): Payer: BC Managed Care – PPO | Admitting: Ophthalmology

## 2013-07-11 DIAGNOSIS — H2700 Aphakia, unspecified eye: Secondary | ICD-10-CM

## 2013-07-12 ENCOUNTER — Encounter: Payer: Self-pay | Admitting: *Deleted

## 2013-07-14 ENCOUNTER — Ambulatory Visit: Payer: BC Managed Care – PPO | Admitting: Internal Medicine

## 2013-07-22 ENCOUNTER — Telehealth: Payer: Self-pay | Admitting: *Deleted

## 2013-07-22 DIAGNOSIS — E119 Type 2 diabetes mellitus without complications: Secondary | ICD-10-CM

## 2013-07-22 NOTE — Telephone Encounter (Signed)
Refill Request  Diabetic test strips  Lancets  Alcohol prep pads  Lancet device  Glucose meter  Control solution

## 2013-07-25 MED ORDER — ACCU-CHEK AVIVA PLUS W/DEVICE KIT: PACK | Status: DC

## 2013-07-25 MED ORDER — GLUCOSE BLOOD VI STRP: ORAL_STRIP | Status: DC

## 2013-07-25 MED ORDER — ALCOHOL SWABS PADS: MEDICATED_PAD | Status: DC

## 2013-07-25 MED ORDER — LANCETS THIN MISC: Status: DC

## 2013-07-25 NOTE — Telephone Encounter (Signed)
Prescriptions sent to pharmacy on file  

## 2013-07-27 ENCOUNTER — Other Ambulatory Visit: Payer: Self-pay | Admitting: *Deleted

## 2013-07-27 DIAGNOSIS — E119 Type 2 diabetes mellitus without complications: Secondary | ICD-10-CM

## 2013-07-27 MED ORDER — ONETOUCH ULTRA SYSTEM W/DEVICE KIT: PACK | Status: DC

## 2013-08-03 ENCOUNTER — Ambulatory Visit: Payer: Self-pay | Admitting: Vascular Surgery

## 2013-08-05 ENCOUNTER — Other Ambulatory Visit (INDEPENDENT_AMBULATORY_CARE_PROVIDER_SITE_OTHER): Payer: BC Managed Care – PPO

## 2013-08-05 ENCOUNTER — Encounter: Payer: Self-pay | Admitting: Gastroenterology

## 2013-08-05 ENCOUNTER — Ambulatory Visit (INDEPENDENT_AMBULATORY_CARE_PROVIDER_SITE_OTHER): Payer: BC Managed Care – PPO | Admitting: Gastroenterology

## 2013-08-05 VITALS — BP 138/60 | HR 72 | Ht 67.5 in | Wt 167.6 lb

## 2013-08-05 DIAGNOSIS — R197 Diarrhea, unspecified: Secondary | ICD-10-CM

## 2013-08-05 LAB — CBC WITH DIFFERENTIAL/PLATELET
BASOS ABS: 0.1 10*3/uL (ref 0.0–0.1)
Basophils Relative: 0.8 % (ref 0.0–3.0)
Eosinophils Absolute: 0.5 10*3/uL (ref 0.0–0.7)
Eosinophils Relative: 4.9 % (ref 0.0–5.0)
HEMATOCRIT: 38.9 % (ref 36.0–46.0)
Hemoglobin: 12.5 g/dL (ref 12.0–15.0)
LYMPHS ABS: 2.1 10*3/uL (ref 0.7–4.0)
Lymphocytes Relative: 18.6 % (ref 12.0–46.0)
MCHC: 32.2 g/dL (ref 30.0–36.0)
MCV: 103.2 fl — ABNORMAL HIGH (ref 78.0–100.0)
MONO ABS: 0.5 10*3/uL (ref 0.1–1.0)
Monocytes Relative: 4.9 % (ref 3.0–12.0)
Neutro Abs: 7.8 10*3/uL — ABNORMAL HIGH (ref 1.4–7.7)
Neutrophils Relative %: 70.8 % (ref 43.0–77.0)
PLATELETS: 218 10*3/uL (ref 150.0–400.0)
RBC: 3.77 Mil/uL — ABNORMAL LOW (ref 3.87–5.11)
RDW: 18.4 % — AB (ref 11.5–14.6)
WBC: 11.1 10*3/uL — AB (ref 4.5–10.5)

## 2013-08-05 LAB — TSH: TSH: 0.86 u[IU]/mL (ref 0.35–5.50)

## 2013-08-05 LAB — COMPREHENSIVE METABOLIC PANEL
ALK PHOS: 90 U/L (ref 39–117)
ALT: 20 U/L (ref 0–35)
AST: 29 U/L (ref 0–37)
Albumin: 3.4 g/dL — ABNORMAL LOW (ref 3.5–5.2)
BILIRUBIN TOTAL: 0.5 mg/dL (ref 0.3–1.2)
BUN: 33 mg/dL — ABNORMAL HIGH (ref 6–23)
CO2: 33 mEq/L — ABNORMAL HIGH (ref 19–32)
Calcium: 9.4 mg/dL (ref 8.4–10.5)
Chloride: 97 mEq/L (ref 96–112)
Creatinine, Ser: 7.4 mg/dL (ref 0.4–1.2)
GFR: 7.28 mL/min — CL (ref >60.00)
Glucose, Bld: 107 mg/dL — ABNORMAL HIGH (ref 70–99)
POTASSIUM: 4.6 meq/L (ref 3.5–5.1)
Sodium: 139 mEq/L (ref 135–145)
Total Protein: 7.2 g/dL (ref 6.0–8.3)

## 2013-08-05 LAB — SEDIMENTATION RATE: Sed Rate: 45 mm/hr — ABNORMAL HIGH (ref 0–22)

## 2013-08-05 NOTE — Progress Notes (Signed)
HPI: This is a  very pleasant 58 year old woman whom I am meeting for the first time today.   Was told she had C. Diff this past January, while inpatient at [facility].  The only c. Diff testing I see is from Jan 19th and it was negative.  Was having terrible diarrhea.  It started shortly after she was d/c from [facility] (from pneumonia and AMI).  She was told the antibiotics used to treat her pneumonia predisposed.  Was put on flagyl, took for two weeks.    She had colonoscopy 02/2011 by Dr. Dionne Milo for IDA: single 26m polyp was removed; pathology not avaiable today. She was not sure what path showed. EGD done same day for IDA; "medium sized AVM" was found in the stomach. Biopsies were done of duodenum (unavailable today). SB Capsule was to be done but machine was broken and test never rescheduled.  Has had alternating bowels for about a year.  Became worse in past 2 months.  She will have 2 days of constipation followed by 2 or 3 days of loose urgent stools. She has to wear depends undergarment now. She has seen no bleeding rectally.  She has a lot of rumbling in her stomach.     Review of systems: Pertinent positive and negative review of systems were noted in the above HPI section. Complete review of systems was performed and was otherwise normal.    Past Medical History  Diagnosis Date  . Hypertension   . ESRD on hemodialysis     [place] [facility] [place].  She started dialysis in Feb 2014.  Etiology of renal failure not known, likely diabetes.  Has a left upper arm AV graft as of Dec 2014.    .Marland KitchenCOPD (chronic obstructive pulmonary disease)   . Emphysema   . Pneumonia     June 2012  . Bronchitis     Mar 2012  . Peripheral vascular disease   . Chronic constipation   . Colon polyps   . Sickle cell trait   . Anemia   . Diabetes mellitus   . Heart murmur     Dr. GCornelius Moras . Urinary tract infection     hx of  . Diabetic neuropathy   .  Diabetic retinopathy 05/28/2013    Hx bilat retinal detachment, proliferative diab retinopathy and bilat vitreous hemorrhage   . Anemia in chronic kidney disease (CKD) 02/18/2012  . Hyperlipidemia   . Coronary artery disease   . Non-ST elevation MI (NSTEMI)     Past Surgical History  Procedure Laterality Date  . Abdominal hysterectomy      2000  . Dilation and curettage of uterus      several in the early 80's  . Eye surgery      bilateral laser 2012  . Esophagogastroduodenoscopy      2012  . Colonscopy    . Tubal ligation      1979  . Pars plana vitrectomy  04/22/2011    Procedure: PARS PLANA VITRECTOMY WITH 25 GAUGE;  Surgeon: JHayden Pedro MD;  [place]  Service: Ophthalmology;  Laterality: Left;  membrane peel, endolaser, gas fluid exchange, silicone oil, repair of complex traction retinal detachment  . Pars plana vitrectomy  09/30/2011    Procedure: PARS PLANA VITRECTOMY WITH 25 GAUGE;  Surgeon: JHayden Pedro MD;  [place]  Service: Ophthalmology;  Laterality: Right;  Endolaser; Repair of Complex Traction Retinal Detachment  .  Gas/fluid exchange  09/30/2011    Procedure: GAS/FLUID EXCHANGE;  Surgeon: [name], MD;  Location: [place];  Service: Ophthalmology;  Laterality: Right;  . Gas insertion  09/30/2011    Procedure: INSERTION OF GAS;  Surgeon: [name], MD;  Location: [place];  Service: Ophthalmology;  Laterality: Right;  [id number]  . Eye surgery      right  . Pars plana vitrectomy  02/24/2012    Procedure: PARS PLANA VITRECTOMY WITH 25 GAUGE;  Surgeon: [name], MD;  Location: [place];  Service: Ophthalmology;  Laterality: Left;  . Silicon oil removal  8/32/5498    Procedure: SILICON OIL REMOVAL;  Surgeon: [name], MD;  Location: [facility];  Service: Ophthalmology;  Laterality: Left;  . Ptca    . Thrombectomy / arteriovenous graft revision    . Cardiac catheterization    . Coronary angioplasty  05/28/2014    stent placement to the mid  RCA    Current Outpatient Prescriptions  Medication Sig Dispense Refill  . albuterol (PROAIR HFA) 108 (90 BASE) MCG/ACT inhaler Inhale 1 puff into the lungs every 4 (four) hours as needed.       . Alcohol Swabs PADS Patient uses at home to assist with testing blood sugars twice daily.  100 each  12  . aspirin EC 81 MG tablet Take 81 mg by mouth daily.      . B Complex-C-Folic Acid (VOL-CARE RX) 1 MG TABS       . Blood Glucose Monitoring Suppl (ONE TOUCH ULTRA SYSTEM KIT) W/DEVICE KIT Please dispense for patient to use at home to check blood sugars.  1 each  0  . carvedilol (COREG) 12.5 MG tablet Take 1 tablet (12.5 mg total) by mouth 2 (two) times daily.  60 tablet  6  . clopidogrel (PLAVIX) 75 MG tablet Take 1 tablet (75 mg total) by mouth daily.  30 tablet  11  . glucose blood (ACCU-CHEK AVIVA PLUS) test strip Patient is using at home to test blood sugars 2 times daily.  100 each  12  . Insulin Detemir 100 UNIT/ML SOPN Inject 20 Units into the skin daily.  10 pen  5  . Insulin Pen Needle 32G X 6 MM MISC 1 Units by Does not apply route as needed.  90 each  3  . Lancets Thin MISC Using at home to test blood sugars twice daily.  200 each  6  . mometasone (NASONEX) 50 MCG/ACT nasal spray Place 2 sprays into the nose daily. 2 sprays by Each Nare route daily as needed.      . nitroGLYCERIN (NITROSTAT) 0.4 MG SL tablet Place 1 tablet (0.4 mg total) under the tongue every 5 (five) minutes as needed for chest pain.  25 tablet  3  . pregabalin (LYRICA) 100 MG capsule Take 100 mg by mouth daily.      . rosuvastatin (CRESTOR) 40 MG tablet Take 1 tablet (40 mg total) by mouth daily.  30 tablet  6   No current facility-administered medications for this visit.    Allergies as of 08/05/2013 - Review Complete 08/05/2013  Allergen Reaction Noted  . Hydrocodone  08/20/2012    Family History  Problem Relation Age of Onset  . Anesthesia problems Neg Hx   . Hypotension Neg Hx   . Malignant hyperthermia  Neg Hx   . Pseudochol deficiency Neg Hx   . Stroke Mother   . Heart attack Mother   . Heart  disease Mother   . Colon cancer Father   . Colon cancer Sister   . Heart attack Brother   . Stroke Brother   . Diabetes Brother   . Breast cancer Sister   . Diabetes Brother   . Diabetes Daughter     History   Social History  . Marital Status: Single    Spouse Name: N/A    Number of Children: 2  . Years of Education: N/A   Occupational History  . [place] History Main Topics  . Smoking status: Former Smoker -- 1.00 packs/day for 25 years    Quit date: 06/09/2010  . Smokeless tobacco: Never Used  . Alcohol Use: No  . Drug Use: No  . Sexual Activity: No   Other Topics Concern  . Not on file   Social History Narrative  . No narrative on file       Physical Exam: BP 138/60  Pulse 72  Ht 5' 7.5" (1.715 m)  Wt 167 lb 9.6 oz (76.023 kg)  BMI 25.85 kg/m2 Constitutional: generally well-appearing Psychiatric: alert and oriented x3 Eyes: extraocular movements intact Mouth: oral pharynx moist, no lesions Neck: supple no lymphadenopathy Cardiovascular: heart regular rate and rhythm Lungs: clear to auscultation bilaterally Abdomen: soft, nontender, nondistended, no obvious ascites, no peritoneal signs, normal bowel sounds Extremities: no lower extremity edema bilaterally Skin: no lesions on visible extremities    Assessment and plan: 58 y.o. female with  recent Clostridium difficile infection  Her most recent C. difficile testing was about a month ago and it was negative. She really had alternating bowels for about a year or so coming into this more acute issue. She had an acute MI about 2 months ago, drug-eluting stents placed now she is on Plavix. She had colonoscopy about 2-1/2 years ago by a different provider. One polyp was removed however I do not have the pathology from that. We will work to get the pathologic results sent here from that as well  as from her duodenum which she biopsy during an upper endoscopy. Those tests were being done for iron deficiency anemia.  I think for now given her recent acute MI, on the Plavix use and reluctant to proceed with any invasive testing unless absolutely needed. Her bowels are now back more to her chronic state over the past year or so with alternating constipation diarrhea and for now she is simply going to start a single Imodium every day. She could have repeat blood testing done today. She'll return to see me in 4-6 weeks and sooner if needed.

## 2013-08-05 NOTE — Patient Instructions (Addendum)
We will get records sent from your previous gastroenterologist for review ([place] colonoscopy, EGD in 2012 pathology reports).  This will include any endoscopic (colonoscopy or upper endoscopy) procedures and any associated pathology reports.   Please start taking one imodium every morning shortly after waking. Please return to see Dr.  in 6 weeks. You will have labs checked today in the basement lab.  Please head down after you check out with the front desk  (cbc, cmet, tsh, esr).   

## 2013-08-08 ENCOUNTER — Telehealth: Payer: Self-pay | Admitting: *Deleted

## 2013-08-08 ENCOUNTER — Ambulatory Visit (INDEPENDENT_AMBULATORY_CARE_PROVIDER_SITE_OTHER): Payer: BC Managed Care – PPO | Admitting: Internal Medicine

## 2013-08-08 ENCOUNTER — Encounter: Payer: Self-pay | Admitting: Internal Medicine

## 2013-08-08 VITALS — BP 158/80 | HR 75 | Temp 98.2°F | Wt 172.0 lb

## 2013-08-08 DIAGNOSIS — E1149 Type 2 diabetes mellitus with other diabetic neurological complication: Secondary | ICD-10-CM

## 2013-08-08 DIAGNOSIS — I2510 Atherosclerotic heart disease of native coronary artery without angina pectoris: Secondary | ICD-10-CM

## 2013-08-08 DIAGNOSIS — E785 Hyperlipidemia, unspecified: Secondary | ICD-10-CM

## 2013-08-08 DIAGNOSIS — E119 Type 2 diabetes mellitus without complications: Secondary | ICD-10-CM

## 2013-08-08 DIAGNOSIS — M25551 Pain in right hip: Secondary | ICD-10-CM | POA: Insufficient documentation

## 2013-08-08 DIAGNOSIS — M25559 Pain in unspecified hip: Secondary | ICD-10-CM

## 2013-08-08 DIAGNOSIS — E1142 Type 2 diabetes mellitus with diabetic polyneuropathy: Secondary | ICD-10-CM

## 2013-08-08 DIAGNOSIS — Z992 Dependence on renal dialysis: Secondary | ICD-10-CM

## 2013-08-08 DIAGNOSIS — E1139 Type 2 diabetes mellitus with other diabetic ophthalmic complication: Secondary | ICD-10-CM

## 2013-08-08 DIAGNOSIS — E11319 Type 2 diabetes mellitus with unspecified diabetic retinopathy without macular edema: Secondary | ICD-10-CM

## 2013-08-08 DIAGNOSIS — I10 Essential (primary) hypertension: Secondary | ICD-10-CM

## 2013-08-08 DIAGNOSIS — E1140 Type 2 diabetes mellitus with diabetic neuropathy, unspecified: Secondary | ICD-10-CM

## 2013-08-08 DIAGNOSIS — N186 End stage renal disease: Secondary | ICD-10-CM

## 2013-08-08 LAB — LIPID PANEL
CHOLESTEROL: 127 mg/dL (ref 0–200)
HDL: 60.7 mg/dL (ref >39.00)
LDL CALC: 49 mg/dL (ref 0–99)
TRIGLYCERIDES: 87 mg/dL (ref 0.0–149.0)
Total CHOL/HDL Ratio: 2
VLDL: 17.4 mg/dL (ref 0.0–40.0)

## 2013-08-08 LAB — HEMOGLOBIN A1C: Hgb A1c MFr Bld: 5.6 % (ref 4.6–6.5)

## 2013-08-08 MED ORDER — OXYCODONE-ACETAMINOPHEN 5-325 MG PO TABS: 1.0000 | ORAL_TABLET | Freq: Three times a day (TID) | ORAL | Status: DC | PRN

## 2013-08-08 MED ORDER — INSULIN PEN NEEDLE 32G X 6 MM MISC: 1.0000 [IU] | Status: DC | PRN

## 2013-08-08 NOTE — Assessment & Plan Note (Signed)
Continue HD TTS. AV graft appears to be functioning well after recent thrombectomy. May be a transplant candidate after completes 1 year on Plavix.

## 2013-08-08 NOTE — Assessment & Plan Note (Signed)
Will check A1c with labs today. Continue Levemir 20units daily.

## 2013-08-08 NOTE — Progress Notes (Signed)
Pre visit review using our clinic review tool, if applicable. No additional management support is needed unless otherwise documented below in the visit note. 

## 2013-08-08 NOTE — Assessment & Plan Note (Signed)
Symptomatically, doing well with no recent chest pain, dyspnea. Continue statin, carvedilol, aspirin, plavix.

## 2013-08-08 NOTE — Progress Notes (Signed)
Subjective:    Patient ID: Theresa [name], female    DOB: 10-25-1955, 58 y.o.   MRN: [id number]  HPI 58YO female with ESRD on HD, CAD s/p DESx2 presents for follow up.  Admitted with NSTEMI, respiratory failure 05/2013. Cath showed 95% occlusion RCA, DES placed. ECHO at that time showed severe hypokinesis of the basalinferior and inferoseptal myocardium. Symptoms improved after stent placement. Pt ultimately discharged, however readmitted to [facility] 06/13/2013 with diarrhea. Diagnosed with C.Diff. Treated with Flagyl. Diarrhea has now improved.  Concerned today about right hip pain x 2 years. Previous xray right hip normal. Sleeps with heating pain with some improvement. Pain worsened by walking or movement. Described as aching pain in posterior right hip which radiates down back of right leg. No weakness or numbness noted.  CAD - no recent chest pain, dyspnea.    ESRD - Dialysis Tu,Th, Sat.  Recently had thrombosis of graft, which was opened by Dr. Lucky Cowboy last week. Hoping for renal transplant after 1 year on Plavix.    Review of Systems  Constitutional: Negative for fever, chills, appetite change, fatigue and unexpected weight change.  HENT: Negative for congestion, ear pain, sinus pressure, sore throat, trouble swallowing and voice change.   Eyes: Negative for visual disturbance.  Respiratory: Negative for cough, shortness of breath, wheezing and stridor.   Cardiovascular: Negative for chest pain, palpitations and leg swelling.  Gastrointestinal: Negative for nausea, vomiting, abdominal pain, diarrhea, constipation, blood in stool, abdominal distention and anal bleeding.  Genitourinary: Negative for dysuria and flank pain.  Musculoskeletal: Positive for arthralgias (right hip). Negative for gait problem, myalgias and neck pain.  Skin: Negative for color change and rash.  Neurological: Negative for dizziness and headaches.  Hematological: Negative for adenopathy. Does not bruise/bleed easily.    Psychiatric/Behavioral: Negative for suicidal ideas, sleep disturbance and dysphoric mood. The patient is not nervous/anxious.        Objective:    BP 158/80  Pulse 75  Temp(Src) 98.2 F (36.8 C) (Oral)  Wt 172 lb (78.019 kg)  SpO2 97% Physical Exam  Constitutional: She is oriented to person, place, and time. She appears well-developed and well-nourished. No distress.  HENT:  Head: Normocephalic and atraumatic.  Right Ear: External ear normal.  Left Ear: External ear normal.  Nose: Nose normal.  Mouth/Throat: Oropharynx is clear and moist. No oropharyngeal exudate.  Eyes: Conjunctivae are normal. Pupils are equal, round, and reactive to light. Right eye exhibits no discharge. Left eye exhibits no discharge. No scleral icterus.  Neck: Normal range of motion. Neck supple. No tracheal deviation present. No thyromegaly present.  Cardiovascular: Normal rate, regular rhythm, normal heart sounds and intact distal pulses.  Exam reveals no gallop and no friction rub.   No murmur heard. Pulmonary/Chest: Effort normal and breath sounds normal. No accessory muscle usage. Not tachypneic. No respiratory distress. She has no decreased breath sounds. She has no wheezes. She has no rhonchi. She has no rales. She exhibits no tenderness.  Musculoskeletal: Normal range of motion. She exhibits no edema and no tenderness.       Legs: Lymphadenopathy:    She has no cervical adenopathy.  Neurological: She is alert and oriented to person, place, and time. No cranial nerve deficit. She exhibits normal muscle tone. Coordination normal.  Skin: Skin is warm and dry. No rash noted. She is not diaphoretic. No erythema. No pallor.     Psychiatric: She has a normal mood and affect. Her behavior is normal. Judgment  and thought content normal.          Assessment & Plan:   Problem List Items Addressed This Visit   Coronary artery disease     Symptomatically, doing well with no recent chest pain, dyspnea.  Continue statin, carvedilol, aspirin, plavix.    Diabetes mellitus, type II     Will check A1c with labs today. Continue Levemir 20units daily.    Relevant Orders      HgB A1c      Lipid Profile   Diabetic neuropathy   Diabetic retinopathy   ESRD on hemodialysis     Continue HD TTS. AV graft appears to be functioning well after recent thrombectomy. May be a transplant candidate after completes 1 year on Plavix.    Hyperlipidemia   Hypertension      BP Readings from Last 3 Encounters:  08/08/13 158/80  08/05/13 138/60  07/08/13 138/62   BP generally well controlled on carvedilol. Will continue.    Right hip pain - Primary     Symptoms and exam most consistent with sciatica. Will set up sports medicine evaluation. Question if she might benefit from local steroid injection. Reviewed plain xray from last year which was normal. Will use oxycodone prn severe pain as pt unable to use NSAIDS given ESRD.    Relevant Medications      OXYCODONE-acetaminophen  5-325 mg po tabs   Other Relevant Orders      Ambulatory referral to Sports Medicine       Return in about 4 weeks (around 09/05/2013) for Physical with PAP.

## 2013-08-08 NOTE — Telephone Encounter (Signed)
Spoke with Shanon Brow from [facility], was given verbal order for diabetic Bayer test strips for patient to test at home 3 times a day. Verbal also given to dispense additional supplies if needed ie alcohol pads, lancets or control solution.

## 2013-08-08 NOTE — Assessment & Plan Note (Signed)
Symptoms and exam most consistent with sciatica. Will set up sports medicine evaluation. Question if she might benefit from local steroid injection. Reviewed plain xray from last year which was normal. Will use oxycodone prn severe pain as pt unable to use NSAIDS given ESRD.

## 2013-08-08 NOTE — Assessment & Plan Note (Signed)
BP Readings from Last 3 Encounters:  08/08/13 158/80  08/05/13 138/60  07/08/13 138/62   BP generally well controlled on carvedilol. Will continue.

## 2013-08-09 ENCOUNTER — Other Ambulatory Visit: Payer: Self-pay | Admitting: *Deleted

## 2013-08-09 ENCOUNTER — Telehealth: Payer: Self-pay | Admitting: Internal Medicine

## 2013-08-09 DIAGNOSIS — E119 Type 2 diabetes mellitus without complications: Secondary | ICD-10-CM

## 2013-08-09 MED ORDER — GLUCOSE BLOOD VI STRP: ORAL_STRIP | Status: DC

## 2013-08-09 NOTE — Telephone Encounter (Signed)
Relevant patient education mailed to patient.  

## 2013-08-15 ENCOUNTER — Institutional Professional Consult (permissible substitution): Payer: BC Managed Care – PPO | Admitting: Family Medicine

## 2013-08-18 ENCOUNTER — Encounter: Payer: Self-pay | Admitting: *Deleted

## 2013-08-22 ENCOUNTER — Ambulatory Visit (INDEPENDENT_AMBULATORY_CARE_PROVIDER_SITE_OTHER)
Admission: RE | Admit: 2013-08-22 | Discharge: 2013-08-22 | Disposition: A | Discharge status: Home / Self Care | Payer: BC Managed Care – PPO | Source: Ambulatory Visit | Attending: Family Medicine | Admitting: Family Medicine

## 2013-08-22 ENCOUNTER — Ambulatory Visit (INDEPENDENT_AMBULATORY_CARE_PROVIDER_SITE_OTHER): Payer: BC Managed Care – PPO | Admitting: Family Medicine

## 2013-08-22 ENCOUNTER — Encounter: Payer: Self-pay | Admitting: Family Medicine

## 2013-08-22 VITALS — BP 130/62 | HR 74 | Temp 97.7°F | Ht 67.5 in | Wt 168.5 lb

## 2013-08-22 DIAGNOSIS — M25551 Pain in right hip: Secondary | ICD-10-CM

## 2013-08-22 DIAGNOSIS — M5416 Radiculopathy, lumbar region: Secondary | ICD-10-CM

## 2013-08-22 DIAGNOSIS — M25559 Pain in unspecified hip: Secondary | ICD-10-CM

## 2013-08-22 DIAGNOSIS — Z992 Dependence on renal dialysis: Secondary | ICD-10-CM

## 2013-08-22 DIAGNOSIS — M5137 Other intervertebral disc degeneration, lumbosacral region: Secondary | ICD-10-CM

## 2013-08-22 DIAGNOSIS — I214 Non-ST elevation (NSTEMI) myocardial infarction: Secondary | ICD-10-CM

## 2013-08-22 DIAGNOSIS — M1611 Unilateral primary osteoarthritis, right hip: Secondary | ICD-10-CM

## 2013-08-22 DIAGNOSIS — M1610 Unilateral primary osteoarthritis, unspecified hip: Secondary | ICD-10-CM

## 2013-08-22 DIAGNOSIS — IMO0002 Reserved for concepts with insufficient information to code with codable children: Secondary | ICD-10-CM

## 2013-08-22 DIAGNOSIS — N186 End stage renal disease: Secondary | ICD-10-CM

## 2013-08-22 DIAGNOSIS — M169 Osteoarthritis of hip, unspecified: Secondary | ICD-10-CM

## 2013-08-22 MED ORDER — LIDOCAINE 5 % EX PTCH: 1.0000 | MEDICATED_PATCH | Freq: Two times a day (BID) | CUTANEOUS | Status: DC

## 2013-08-22 MED ORDER — LORAZEPAM 0.5 MG PO TABS: 0.5000 mg | ORAL_TABLET | Freq: Every day | ORAL | Status: DC

## 2013-08-22 NOTE — Progress Notes (Signed)
Pre visit review using our clinic review tool, if applicable. No additional management support is needed unless otherwise documented below in the visit note. 

## 2013-08-22 NOTE — Patient Instructions (Signed)
F/u 1 month 

## 2013-08-22 NOTE — Progress Notes (Signed)
Date:  08/22/2013   Name:  Theresa [name]   DOB:  1955-07-26   MRN:  [id number]  Primary Physician: Rica Mast, MD  Dear Dr. Gilford Rile,  Thank you for having me see Theresa [name] today at [facility] at [facility] for Theresa [name] problem with hip and back pain.  As you may recall, Theresa [name] is a 58 y.o. year old female on hemodialysis for ESRD and on Plavix s/p PCI for NSTEMI recently and IDDM with a history of R sided "hip" pain, back pain, and lumbar radiculopathy for at least 1 year.    Sciatic nerve and goes all the way down hip. Radiating pain down both legs - front and back on the entire leg and to the foot. Has been going on for a year. Went to PT. Also tried some percocet.  Lyrica 100 mg once day, with a history of diabetic neuropathy, and on HD.  HD, 3 x, Tu, Thurs, Sat.   Back and hips hurt all the time.  Walking humped over much of the time.  Theresa [name] describes Theresa [name] hip pain as posterior and does not have anterior groin pain.  R hip x-ray normal, 07/02/2011. These are not available for my review.   [place] 2 years ago, broken shoulder, but Theresa [name] denies a history of trauma or surgery to the hip or back.  Denies bowel or bladder incontinence. No saddle anesthesia.    Past Medical History  Diagnosis Date  . Hypertension   . ESRD on hemodialysis     Pt gets dialysis with [facility] in [place], [place], on a TTS schedule.  Theresa [name] started dialysis in Feb 2014.  Etiology of renal failure not known, likely diabetes.  Has a left upper arm AV graft as of Dec 2014.    Marland Kitchen COPD (chronic obstructive pulmonary disease)   . Emphysema   . Pneumonia     June 2012  . Bronchitis     Mar 2012  . Peripheral vascular disease   . Chronic constipation   . Colon polyps   . Sickle cell trait   . Anemia   . Diabetes mellitus   . Heart murmur     Dr. Cornelius Moras  . Urinary tract infection     hx of  . Diabetic neuropathy   . Diabetic retinopathy 05/28/2013    Hx bilat retinal detachment,  proliferative diab retinopathy and bilat vitreous hemorrhage   . Anemia in chronic kidney disease (CKD) 02/18/2012  . Hyperlipidemia   . Coronary artery disease   . Non-ST elevation MI (NSTEMI)     Past Surgical History  Procedure Laterality Date  . Abdominal hysterectomy      2000  . Dilation and curettage of uterus      several in the early 80's  . Eye surgery      bilateral laser 2012  . Esophagogastroduodenoscopy      2012  . Colonscopy    . Tubal ligation      1979  . Pars plana vitrectomy  04/22/2011    Procedure: PARS PLANA VITRECTOMY WITH 25 GAUGE;  Surgeon: [name], MD;  Location: [place];  Service: Ophthalmology;  Laterality: Left;  membrane peel, endolaser, gas fluid exchange, silicone oil, repair of complex traction retinal detachment  . Pars plana vitrectomy  09/30/2011    Procedure: PARS PLANA VITRECTOMY WITH 25 GAUGE;  Surgeon: [name], MD;  Location: [place];  Service: Ophthalmology;  Laterality: Right;  Endolaser; Repair of  Complex Traction Retinal Detachment  . Gas/fluid exchange  09/30/2011    Procedure: GAS/FLUID EXCHANGE;  Surgeon: [name], MD;  Location: [place];  Service: Ophthalmology;  Laterality: Right;  . Gas insertion  09/30/2011    Procedure: INSERTION OF GAS;  Surgeon: [name], MD;  Location: [place];  Service: Ophthalmology;  Laterality: Right;  [id number]  . Eye surgery      right  . Pars plana vitrectomy  02/24/2012    Procedure: PARS PLANA VITRECTOMY WITH 25 GAUGE;  Surgeon: [name], MD;  Location: [place];  Service: Ophthalmology;  Laterality: Left;  . Silicon oil removal  0/76/8088    Procedure: SILICON OIL REMOVAL;  Surgeon: [name], MD;  Location: [place];  Service: Ophthalmology;  Laterality: Left;  . Ptca    . Thrombectomy / arteriovenous graft revision    . Cardiac catheterization    . Coronary angioplasty  05/28/2014    stent placement to the mid RCA    History   Social History  . Marital Status: Single     Spouse Name: N/A    Number of Children: 2  . Years of Education: N/A   Occupational History  . [place] History Main Topics  . Smoking status: Former Smoker -- 1.00 packs/day for 25 years    Quit date: 06/09/2010  . Smokeless tobacco: Never Used  . Alcohol Use: No  . Drug Use: No  . Sexual Activity: No   Other Topics Concern  . None   Social History Narrative  . None    Family History  Problem Relation Age of Onset  . Anesthesia problems Neg Hx   . Hypotension Neg Hx   . Malignant hyperthermia Neg Hx   . Pseudochol deficiency Neg Hx   . Stroke Mother   . Heart attack Mother   . Heart disease Mother   . Colon cancer Father   . Colon cancer Sister   . Heart attack Brother   . Stroke Brother   . Diabetes Brother   . Breast cancer Sister   . Diabetes Brother   . Diabetes Daughter     Medications and Allergies reviewed  Review of Systems:    GEN: No fevers, chills. Nontoxic. Primarily MSK c/o today. MSK: Detailed in the HPI GI: tolerating PO intake without difficulty Neuro: as above Otherwise, the pertinent positives and negatives are listed above and in the HPI, otherwise a full review of systems has been reviewed and is negative unless noted positive.   Physical Examination: BP 130/62  Pulse 74  Temp(Src) 97.7 F (36.5 C) (Oral)  Ht 5' 7.5" (1.715 m)  Wt 168 lb 8 oz (76.431 kg)  BMI 25.99 kg/m2    GEN: Well-developed,well-nourished,in no acute distress; alert,appropriate and cooperative throughout examination HEENT: Normocephalic and atraumatic without obvious abnormalities. Ears, externally no deformities PULM: Breathing comfortably in no respiratory distress EXT: No clubbing, cyanosis, or edema PSYCH: Normally interactive. Cooperative during the interview. Pleasant. Friendly and conversant. Not anxious or depressed appearing. Normal, full affect.  Range of motion at  the waist: Flexion, extension, lateral bending and rotation:  relatively preserved with some terminal pain  No echymosis or edema Rises to examination table with mild difficulty Gait: minimally antalgic  Inspection/Deformity: N Paraspinus Tenderness: l2-s1 right and left  B Ankle Dorsiflexion (L5,4): 5/5 B Great Toe Dorsiflexion (L5,4): 5/5 Heel Walk (L5): WNL Toe Walk (S1): WNL Rise/Squat (L4): WNL, mild pain  SENSORY B Medial Foot (L4): WNL B Dorsum (L5): WNL B Lateral (S1): WNL Light Touch: WNL Pinprick: WNL  REFLEXES Knee (L4): 2+ Ankle (S1): 2+  B SLR, seated: neg B SLR, supine: pain in back only B FABER: neg B Reverse FABER: neg B Greater Troch: NT B Log Roll: neg B Stork: NT B Sciatic Notch: NT   HIP EXAM: SIDE: b ROM: Abduction, Flexion, Internal and External range of motion: 15 deg loss of IROM on R and L Pain with terminal IROM and EROM: posterior GTB: NT SLR: NEG Knees: No effusion FABER: NT REVERSE FABER: NT, neg Piriformis: TTP Str: flexion: 5/5 abduction: 5/5 adduction: 5/5 Strength testing non-tender  Objective Data: Dg Lumbar Spine Complete  08/22/2013   CLINICAL DATA:  Lumbar radiculopathy  EXAM: LUMBAR SPINE - COMPLETE 4+ VIEW  COMPARISON:  None.  FINDINGS: Normal alignment. Mild narrowing of the L5-S1 interspace with adjacent discogenic sclerosis in the vertebral bodies and anterior endplate spurring. Negative for fracture. Patchy aortic calcifications without suggestion of aneurysm.  IMPRESSION: 1. Negative for fracture or other acute bone abnormality. 2. Degenerative disc disease L5-S1.   Electronically Signed   By: Arne Cleveland M.D.   On: 08/22/2013 10:41   Dg Hip Complete Right  08/22/2013   CLINICAL DATA:  Right lower extremity radicular symptoms  EXAM: RIGHT HIP - COMPLETE 2+ VIEW  COMPARISON:  DG HIP COMPLETE*R* dated 06/30/2011  FINDINGS: The bony pelvis is adequately mineralized. There is no lytic or blastic lesion. The hip joint spaces are mildly narrowed bilaterally. This has slightly  progressed since the previous study. The femoral heads and remain smoothly rounded. The observed portions of the sacrum are grossly normal.  AP and frogleg lateral views of the right hip reveal no acute bony abnormality. Again demonstrated is narrowing of the hip joint compartment.  IMPRESSION: There is degenerative narrowing of the right hip joint. There is no evidence of an acute fracture or dislocation.   Electronically Signed   By: David  Martinique   On: 08/22/2013 11:31    Assessment and Plan: Lumbar radiculopathy Right hip pain Osteoarthritis of right hip DDD (degenerative disc disease), lumbosacral ESRD on hemodialysis NSTEMI (non-ST elevated myocardial infarction)  I suspect primary driver is DDD at G8-U1 with probable nerve encroachment at foramen bilaterally    Recommendations: 1. ESRD on HD and s/p recent PCI for NSTEMI presents challenges. 2. Lidocaine patch to lower back 3. Ativan 0.5 mg at night for muscle relaxer. Most others cannot be used in ESRD, but ativan is eliminated almost completely by the liver, and benzos are excellent muscle relaxers. 4. Continue HEP 5. Lyrica 100 mg daily is an excellent idea and can help radiculopathy, but I am hesitant to increase dose in HD patient. 6. Heat and massage prn. 7. Interventions such as ESI limited on plavix, but I am comfortable doing intraarticular hip injection under guidance if still significant pain around hip.  New Prescriptions   LIDOCAINE (LIDODERM) 5 %    Place 1 patch onto the skin every 12 (twelve) hours. Remove & Discard patch within 12 hours or as directed by MD   LORAZEPAM (ATIVAN) 0.5 MG TABLET    Take 1 tablet (0.5 mg total) by mouth at bedtime.   We will see the patient back in 1 month.  Thank you for having [place] see Theresa [name].  Feel free to contact me with any questions.  Updated Medication List: Patient's Medications  New Prescriptions  LIDOCAINE (LIDODERM) 5 %    Place 1 patch onto the  skin every 12 (twelve) hours. Remove & Discard patch within 12 hours or as directed by MD   LORAZEPAM (ATIVAN) 0.5 MG TABLET    Take 1 tablet (0.5 mg total) by mouth at bedtime.  Previous Medications   ALBUTEROL (PROAIR HFA) 108 (90 BASE) MCG/ACT INHALER    Inhale 1 puff into the lungs every 4 (four) hours as needed.    ALCOHOL SWABS PADS    Patient uses at home to assist with testing blood sugars twice daily.   ASPIRIN EC 81 MG TABLET    Take 81 mg by mouth daily.   B COMPLEX-C-FOLIC ACID (VOL-CARE RX) 1 MG TABS       BLOOD GLUCOSE MONITORING SUPPL (ONE TOUCH ULTRA SYSTEM KIT) W/DEVICE KIT    Please dispense for patient to use at home to check blood sugars.   CALCIUM ACETATE (PHOSLO) 667 MG CAPSULE       CARVEDILOL (COREG) 12.5 MG TABLET    Take 1 tablet (12.5 mg total) by mouth 2 (two) times daily.   CLOPIDOGREL (PLAVIX) 75 MG TABLET    Take 1 tablet (75 mg total) by mouth daily.   GLUCOSE BLOOD (ACCU-CHEK AVIVA PLUS) TEST STRIP    Patient is using at home to test blood sugars 2 times daily.   HYOSCYAMINE (LEVSIN SL) 0.125 MG SL TABLET       INSULIN DETEMIR 100 UNIT/ML SOPN    Inject 20 Units into the skin daily.   INSULIN PEN NEEDLE 32G X 6 MM MISC    1 Units by Does not apply route as needed.   LANCETS THIN MISC    Using at home to test blood sugars twice daily.   MOMETASONE (NASONEX) 50 MCG/ACT NASAL SPRAY    Place 2 sprays into the nose daily. 2 sprays by Each Nare route daily as needed.   NITROGLYCERIN (NITROSTAT) 0.4 MG SL TABLET    Place 1 tablet (0.4 mg total) under the tongue every 5 (five) minutes as needed for chest pain.   OXYCODONE-ACETAMINOPHEN (ROXICET) 5-325 MG PER TABLET    Take 1 tablet by mouth every 8 (eight) hours as needed for severe pain.   PREGABALIN (LYRICA) 100 MG CAPSULE    Take 100 mg by mouth daily.   ROSUVASTATIN (CRESTOR) 40 MG TABLET    Take 1 tablet (40 mg total) by mouth daily.  Modified Medications   No medications on file  Discontinued Medications   No  medications on file     Signed,  Frederico Hamman T. Sigmund Morera, MD, [place] at [facility] [address] [place], [place] [address] Phone: [phone] Fax: [phone]

## 2013-08-23 DIAGNOSIS — M1611 Unilateral primary osteoarthritis, right hip: Secondary | ICD-10-CM | POA: Insufficient documentation

## 2013-08-23 DIAGNOSIS — M5137 Other intervertebral disc degeneration, lumbosacral region: Secondary | ICD-10-CM | POA: Insufficient documentation

## 2013-09-02 ENCOUNTER — Other Ambulatory Visit (HOSPITAL_COMMUNITY)
Admission: RE | Admit: 2013-09-02 | Discharge: 2013-09-02 | Disposition: A | Discharge status: Home / Self Care | Payer: BC Managed Care – PPO | Source: Ambulatory Visit | Attending: Internal Medicine | Admitting: Internal Medicine

## 2013-09-02 ENCOUNTER — Ambulatory Visit (INDEPENDENT_AMBULATORY_CARE_PROVIDER_SITE_OTHER): Payer: BC Managed Care – PPO | Admitting: Internal Medicine

## 2013-09-02 ENCOUNTER — Encounter: Payer: Self-pay | Admitting: Internal Medicine

## 2013-09-02 VITALS — BP 128/60 | HR 79 | Temp 98.4°F | Ht 67.5 in | Wt 163.0 lb

## 2013-09-02 DIAGNOSIS — Z113 Encounter for screening for infections with a predominantly sexual mode of transmission: Secondary | ICD-10-CM | POA: Insufficient documentation

## 2013-09-02 DIAGNOSIS — E119 Type 2 diabetes mellitus without complications: Secondary | ICD-10-CM

## 2013-09-02 DIAGNOSIS — N760 Acute vaginitis: Secondary | ICD-10-CM | POA: Insufficient documentation

## 2013-09-02 DIAGNOSIS — Z1151 Encounter for screening for human papillomavirus (HPV): Secondary | ICD-10-CM | POA: Insufficient documentation

## 2013-09-02 DIAGNOSIS — Z0000 Encounter for general adult medical examination without abnormal findings: Secondary | ICD-10-CM | POA: Insufficient documentation

## 2013-09-02 DIAGNOSIS — Z01419 Encounter for gynecological examination (general) (routine) without abnormal findings: Secondary | ICD-10-CM | POA: Insufficient documentation

## 2013-09-02 MED ORDER — CLOPIDOGREL BISULFATE 75 MG PO TABS: 75.0000 mg | ORAL_TABLET | Freq: Every day | ORAL | Status: DC

## 2013-09-02 NOTE — Assessment & Plan Note (Signed)
Lab Results  Component Value Date   HGBA1C 5.6 08/08/2013   Recent blood sugars low. Will reduce dose of Levemir to 15units daily and have her call with BG readings next week. She will need to hold Levemir if any more BG <70.

## 2013-09-02 NOTE — Progress Notes (Signed)
Pre visit review using our clinic review tool, if applicable. No additional management support is needed unless otherwise documented below in the visit note. 

## 2013-09-02 NOTE — Progress Notes (Signed)
Subjective:    Patient ID: Theresa [name], female    DOB: 02-21-1956, 58 y.o.   MRN: [id number]  HPI 58YO female with ESRD on HD, CAD, DM presents for annual exam.  DM - BG running low, sometimes in 60s with symptoms of nausea and lightheadedness. Improved with eating food. Has been taking Levemir 20units daily.  Otherwise, feeling well. Planning to prepare for possible kidney transplant next year.   Review of Systems  Constitutional: Negative for fever, chills, appetite change, fatigue and unexpected weight change.  HENT: Negative for congestion, ear pain, sinus pressure, sore throat, trouble swallowing and voice change.   Eyes: Negative for visual disturbance.  Respiratory: Negative for cough, shortness of breath, wheezing and stridor.   Cardiovascular: Negative for chest pain, palpitations and leg swelling.  Gastrointestinal: Positive for nausea. Negative for vomiting, abdominal pain, diarrhea, constipation, blood in stool, abdominal distention and anal bleeding.  Genitourinary: Negative for dysuria and flank pain.  Musculoskeletal: Negative for arthralgias, gait problem, myalgias and neck pain.  Skin: Negative for color change and rash.  Neurological: Negative for dizziness and headaches.  Hematological: Negative for adenopathy. Does not bruise/bleed easily.  Psychiatric/Behavioral: Negative for suicidal ideas, sleep disturbance and dysphoric mood. The patient is not nervous/anxious.        Objective:    BP 128/60  Pulse 79  Temp(Src) 98.4 F (36.9 C) (Oral)  Ht 5' 7.5" (1.715 m)  Wt 163 lb (73.936 kg)  BMI 25.14 kg/m2  SpO2 97% Physical Exam  Constitutional: She is oriented to person, place, and time. She appears well-developed and well-nourished. No distress.  HENT:  Head: Normocephalic and atraumatic.  Right Ear: External ear normal.  Left Ear: External ear normal.  Nose: Nose normal.  Mouth/Throat: Oropharynx is clear and moist. No oropharyngeal exudate.  Eyes:  Conjunctivae are normal. Pupils are equal, round, and reactive to light. Right eye exhibits no discharge. Left eye exhibits no discharge. No scleral icterus.  Neck: Normal range of motion. Neck supple. No tracheal deviation present. No thyromegaly present.  Cardiovascular: Normal rate, regular rhythm, normal heart sounds and intact distal pulses.  Exam reveals no gallop and no friction rub.   No murmur heard. Pulmonary/Chest: Effort normal and breath sounds normal. No respiratory distress. She has no wheezes. She has no rales. She exhibits no tenderness.  Abdominal: Soft. Bowel sounds are normal. She exhibits no distension and no mass. There is no tenderness. There is no rebound and no guarding.  Genitourinary: Rectum normal, vagina normal and uterus normal. No breast swelling, tenderness, discharge or bleeding. Pelvic exam was performed with patient supine. There is no rash, tenderness or lesion on the right labia. There is no rash, tenderness or lesion on the left labia. Uterus is not enlarged and not tender. Cervix exhibits discharge (yellow). Cervix exhibits no motion tenderness and no friability. Right adnexum displays no mass, no tenderness and no fullness. Left adnexum displays no mass, no tenderness and no fullness. No erythema or tenderness around the vagina. No vaginal discharge found.  Tenderness with PAP smear, however exam otherwise normal. No CMT on palpation.  Musculoskeletal: Normal range of motion. She exhibits no edema and no tenderness.  Lymphadenopathy:    She has no cervical adenopathy.  Neurological: She is alert and oriented to person, place, and time. No cranial nerve deficit. She exhibits normal muscle tone. Coordination normal.  Skin: Skin is warm and dry. No rash noted. She is not diaphoretic. No erythema. No pallor.  Psychiatric:  She has a normal mood and affect. Her behavior is normal. Judgment and thought content normal.          Assessment & Plan:   Problem List  Items Addressed This Visit   Diabetes mellitus, type II      Lab Results  Component Value Date   HGBA1C 5.6 08/08/2013   Recent blood sugars low. Will reduce dose of Levemir to 15units daily and have her call with BG readings next week. She will need to hold Levemir if any more BG <70.    Routine general medical examination at a health care facility - Primary     General medical exam including breast and pelvic exam normal today except as noted. PAP pending. Mammogram ordered. Colonoscopy UTD.  Immunizations UTD. Encouraged healthy diet and physical activity.    Relevant Orders      Cytology - PAP       Return in about 3 months (around 12/03/2013) for Recheck of Diabetes.

## 2013-09-02 NOTE — Assessment & Plan Note (Signed)
General medical exam including breast and pelvic exam normal today except as noted. PAP pending. Mammogram ordered. Colonoscopy UTD.  Immunizations UTD. Encouraged healthy diet and physical activity.

## 2013-09-03 ENCOUNTER — Emergency Department: Payer: Self-pay | Admitting: Emergency Medicine

## 2013-09-03 DIAGNOSIS — R9431 Abnormal electrocardiogram [ECG] [EKG]: Secondary | ICD-10-CM | POA: Diagnosis not present

## 2013-09-03 LAB — CBC
HCT: 39.6 % (ref 35.0–47.0)
HGB: 13.2 g/dL (ref 12.0–16.0)
MCH: 33.5 pg (ref 26.0–34.0)
MCHC: 33.4 g/dL (ref 32.0–36.0)
MCV: 100 fL (ref 80–100)
Platelet: 138 10*3/uL — ABNORMAL LOW (ref 150–440)
RBC: 3.95 10*6/uL (ref 3.80–5.20)
RDW: 15.3 % — ABNORMAL HIGH (ref 11.5–14.5)
WBC: 7.2 10*3/uL (ref 3.6–11.0)

## 2013-09-03 LAB — COMPREHENSIVE METABOLIC PANEL
ALK PHOS: 85 U/L
ALT: 28 U/L (ref 12–78)
AST: 38 U/L — AB (ref 15–37)
Albumin: 3.5 g/dL (ref 3.4–5.0)
Anion Gap: 8 (ref 7–16)
BUN: 30 mg/dL — ABNORMAL HIGH (ref 7–18)
Bilirubin,Total: 0.4 mg/dL (ref 0.2–1.0)
CALCIUM: 8.6 mg/dL (ref 8.5–10.1)
Chloride: 100 mmol/L (ref 98–107)
Co2: 29 mmol/L (ref 21–32)
Creatinine: 5.98 mg/dL — ABNORMAL HIGH (ref 0.60–1.30)
EGFR (African American): 8 — ABNORMAL LOW
EGFR (Non-African Amer.): 7 — ABNORMAL LOW
GLUCOSE: 83 mg/dL (ref 65–99)
Osmolality: 279 (ref 275–301)
Potassium: 3.9 mmol/L (ref 3.5–5.1)
Sodium: 137 mmol/L (ref 136–145)
TOTAL PROTEIN: 7.2 g/dL (ref 6.4–8.2)

## 2013-09-03 LAB — LIPASE, BLOOD: LIPASE: 96 U/L (ref 73–393)

## 2013-09-06 ENCOUNTER — Encounter: Payer: Self-pay | Admitting: *Deleted

## 2013-09-06 ENCOUNTER — Telehealth: Payer: Self-pay | Admitting: Internal Medicine

## 2013-09-06 NOTE — Telephone Encounter (Signed)
The patient returned your call. °

## 2013-09-06 NOTE — Telephone Encounter (Signed)
Per patient this has been scheduled, her appointment is on Friday of this week.

## 2013-09-06 NOTE — Telephone Encounter (Signed)
Mammogram showed calcifications in the left breast. They have requested additional views. Has this been scheduled?

## 2013-09-06 NOTE — Telephone Encounter (Signed)
Left message to call back  

## 2013-09-07 ENCOUNTER — Telehealth: Payer: Self-pay | Admitting: *Deleted

## 2013-09-07 DIAGNOSIS — N186 End stage renal disease: Secondary | ICD-10-CM | POA: Diagnosis not present

## 2013-09-07 LAB — CERVICOVAGINAL ANCILLARY ONLY
BACTERIAL VAGINITIS: POSITIVE — AB
Candida vaginitis: NEGATIVE
Herpes: NEGATIVE

## 2013-09-07 MED ORDER — METRONIDAZOLE 0.75 % VA GEL
1.0000 | Freq: Every day | VAGINAL | Status: AC
Start: 2013-09-07 — End: 2013-09-12

## 2013-09-07 NOTE — Telephone Encounter (Signed)
Message copied by Ronaldo Miyamoto on Wed Sep 07, 2013  9:41 AM ------      Message from: Ronette Deter A      Created: Wed Sep 07, 2013  7:51 AM       Vaginal culture was positive for Bacterial vaginosis. I would prefer to treat topically, given her underlying kidney disease. Please call in Metrogel [id number] 5g (one applicator full) intravaginally daily x 5 days. ------

## 2013-09-07 NOTE — Telephone Encounter (Signed)
Prescription sent to pharmacy on file per patient request.

## 2013-09-08 DIAGNOSIS — D509 Iron deficiency anemia, unspecified: Secondary | ICD-10-CM | POA: Diagnosis not present

## 2013-09-08 DIAGNOSIS — N039 Chronic nephritic syndrome with unspecified morphologic changes: Secondary | ICD-10-CM | POA: Diagnosis not present

## 2013-09-08 DIAGNOSIS — N186 End stage renal disease: Secondary | ICD-10-CM | POA: Diagnosis not present

## 2013-09-08 DIAGNOSIS — N2581 Secondary hyperparathyroidism of renal origin: Secondary | ICD-10-CM | POA: Diagnosis not present

## 2013-09-08 DIAGNOSIS — E1129 Type 2 diabetes mellitus with other diabetic kidney complication: Secondary | ICD-10-CM | POA: Diagnosis not present

## 2013-09-08 DIAGNOSIS — D631 Anemia in chronic kidney disease: Secondary | ICD-10-CM | POA: Diagnosis not present

## 2013-09-09 DIAGNOSIS — R928 Other abnormal and inconclusive findings on diagnostic imaging of breast: Secondary | ICD-10-CM | POA: Diagnosis not present

## 2013-09-10 DIAGNOSIS — N186 End stage renal disease: Secondary | ICD-10-CM | POA: Diagnosis not present

## 2013-09-10 DIAGNOSIS — D631 Anemia in chronic kidney disease: Secondary | ICD-10-CM | POA: Diagnosis not present

## 2013-09-10 DIAGNOSIS — N2581 Secondary hyperparathyroidism of renal origin: Secondary | ICD-10-CM | POA: Diagnosis not present

## 2013-09-10 DIAGNOSIS — E1129 Type 2 diabetes mellitus with other diabetic kidney complication: Secondary | ICD-10-CM | POA: Diagnosis not present

## 2013-09-10 DIAGNOSIS — N039 Chronic nephritic syndrome with unspecified morphologic changes: Secondary | ICD-10-CM | POA: Diagnosis not present

## 2013-09-10 DIAGNOSIS — D509 Iron deficiency anemia, unspecified: Secondary | ICD-10-CM | POA: Diagnosis not present

## 2013-09-13 DIAGNOSIS — N2581 Secondary hyperparathyroidism of renal origin: Secondary | ICD-10-CM | POA: Diagnosis not present

## 2013-09-13 DIAGNOSIS — E1129 Type 2 diabetes mellitus with other diabetic kidney complication: Secondary | ICD-10-CM | POA: Diagnosis not present

## 2013-09-13 DIAGNOSIS — D509 Iron deficiency anemia, unspecified: Secondary | ICD-10-CM | POA: Diagnosis not present

## 2013-09-13 DIAGNOSIS — D631 Anemia in chronic kidney disease: Secondary | ICD-10-CM | POA: Diagnosis not present

## 2013-09-13 DIAGNOSIS — N186 End stage renal disease: Secondary | ICD-10-CM | POA: Diagnosis not present

## 2013-09-15 DIAGNOSIS — D631 Anemia in chronic kidney disease: Secondary | ICD-10-CM | POA: Diagnosis not present

## 2013-09-15 DIAGNOSIS — N186 End stage renal disease: Secondary | ICD-10-CM | POA: Diagnosis not present

## 2013-09-15 DIAGNOSIS — D509 Iron deficiency anemia, unspecified: Secondary | ICD-10-CM | POA: Diagnosis not present

## 2013-09-15 DIAGNOSIS — N2581 Secondary hyperparathyroidism of renal origin: Secondary | ICD-10-CM | POA: Diagnosis not present

## 2013-09-15 DIAGNOSIS — E1129 Type 2 diabetes mellitus with other diabetic kidney complication: Secondary | ICD-10-CM | POA: Diagnosis not present

## 2013-09-17 DIAGNOSIS — E1129 Type 2 diabetes mellitus with other diabetic kidney complication: Secondary | ICD-10-CM | POA: Diagnosis not present

## 2013-09-17 DIAGNOSIS — N186 End stage renal disease: Secondary | ICD-10-CM | POA: Diagnosis not present

## 2013-09-17 DIAGNOSIS — N2581 Secondary hyperparathyroidism of renal origin: Secondary | ICD-10-CM | POA: Diagnosis not present

## 2013-09-17 DIAGNOSIS — D631 Anemia in chronic kidney disease: Secondary | ICD-10-CM | POA: Diagnosis not present

## 2013-09-17 DIAGNOSIS — D509 Iron deficiency anemia, unspecified: Secondary | ICD-10-CM | POA: Diagnosis not present

## 2013-09-19 DIAGNOSIS — N186 End stage renal disease: Secondary | ICD-10-CM | POA: Diagnosis not present

## 2013-09-19 DIAGNOSIS — E1129 Type 2 diabetes mellitus with other diabetic kidney complication: Secondary | ICD-10-CM | POA: Diagnosis not present

## 2013-09-19 DIAGNOSIS — D509 Iron deficiency anemia, unspecified: Secondary | ICD-10-CM | POA: Diagnosis not present

## 2013-09-19 DIAGNOSIS — D631 Anemia in chronic kidney disease: Secondary | ICD-10-CM | POA: Diagnosis not present

## 2013-09-19 DIAGNOSIS — N2581 Secondary hyperparathyroidism of renal origin: Secondary | ICD-10-CM | POA: Diagnosis not present

## 2013-09-20 ENCOUNTER — Encounter: Payer: Self-pay | Admitting: Internal Medicine

## 2013-09-21 DIAGNOSIS — E1129 Type 2 diabetes mellitus with other diabetic kidney complication: Secondary | ICD-10-CM | POA: Diagnosis not present

## 2013-09-21 DIAGNOSIS — D631 Anemia in chronic kidney disease: Secondary | ICD-10-CM | POA: Diagnosis not present

## 2013-09-21 DIAGNOSIS — N2581 Secondary hyperparathyroidism of renal origin: Secondary | ICD-10-CM | POA: Diagnosis not present

## 2013-09-21 DIAGNOSIS — N186 End stage renal disease: Secondary | ICD-10-CM | POA: Diagnosis not present

## 2013-09-21 DIAGNOSIS — D509 Iron deficiency anemia, unspecified: Secondary | ICD-10-CM | POA: Diagnosis not present

## 2013-09-21 DIAGNOSIS — N039 Chronic nephritic syndrome with unspecified morphologic changes: Secondary | ICD-10-CM | POA: Diagnosis not present

## 2013-09-22 ENCOUNTER — Encounter: Payer: Self-pay | Admitting: Family Medicine

## 2013-09-22 ENCOUNTER — Ambulatory Visit (INDEPENDENT_AMBULATORY_CARE_PROVIDER_SITE_OTHER): Payer: Medicare Other | Admitting: Family Medicine

## 2013-09-22 VITALS — BP 136/70 | HR 76 | Temp 98.4°F | Ht 67.5 in | Wt 165.0 lb

## 2013-09-22 DIAGNOSIS — M5137 Other intervertebral disc degeneration, lumbosacral region: Secondary | ICD-10-CM | POA: Diagnosis not present

## 2013-09-22 DIAGNOSIS — Z992 Dependence on renal dialysis: Secondary | ICD-10-CM

## 2013-09-22 DIAGNOSIS — I214 Non-ST elevation (NSTEMI) myocardial infarction: Secondary | ICD-10-CM | POA: Diagnosis not present

## 2013-09-22 DIAGNOSIS — IMO0002 Reserved for concepts with insufficient information to code with codable children: Secondary | ICD-10-CM | POA: Diagnosis not present

## 2013-09-22 DIAGNOSIS — M5416 Radiculopathy, lumbar region: Secondary | ICD-10-CM

## 2013-09-22 DIAGNOSIS — N186 End stage renal disease: Secondary | ICD-10-CM

## 2013-09-22 DIAGNOSIS — M51379 Other intervertebral disc degeneration, lumbosacral region without mention of lumbar back pain or lower extremity pain: Secondary | ICD-10-CM

## 2013-09-22 MED ORDER — LIDOCAINE HCL 4 % EX SOLN: Freq: Three times a day (TID) | CUTANEOUS | Status: DC | PRN

## 2013-09-22 NOTE — Patient Instructions (Signed)
1/4 Percocet tablet twice a day If that makes you too drowsy, then decrease to once a day  We are going to try to get the Lidocaine patches approved again.

## 2013-09-22 NOTE — Progress Notes (Signed)
Pre visit review using our clinic review tool, if applicable. No additional management support is needed unless otherwise documented below in the visit note. 

## 2013-09-22 NOTE — Progress Notes (Signed)
Date:  09/22/2013   Name:  Theresa [name]   DOB:  May 04, 1956   MRN:  [id number]  Primary Physician: Rica Mast, MD  Pleasant patient on HD for ESRD and with recent PCI for MI in 05/2013 on Plavix with ongoing back pain, radiculopathy. Intervally, ativan did minimal to help symptoms, and her insurance did not approve Lidocaine patches. She is intermittently taking 1/2 tab of Percocet 5 mg, which makes her fall asleep. She is still having mostly r buttocks pain and LBP.  Prior work-up and conservative management has been thorough per below. With higher doses of Lyrica she has had some twitching.   TENS unit Spinal cord stimulator - no Percocet 1/4 tab bid  Plavix, pci 05/2013  08/22/2013 Last OV with Owens Loffler, MD  Thank you for having me see Theresa [name] in consultation today at [facility] at [facility] for her problem with hip and back pain.  As you may recall, she is a 58 y.o. year old female on hemodialysis for ESRD and on Plavix s/p PCI for NSTEMI recently and IDDM with a history of R sided "hip" pain, back pain, and lumbar radiculopathy for at least 1 year.    Sciatic nerve and goes all the way down hip. Radiating pain down both legs - front and back on the entire leg and to the foot. Has been going on for a year. Went to PT. Also tried some percocet.  Lyrica 100 mg once day, with a history of diabetic neuropathy, and on HD.  HD, 3 x, Tu, Thurs, Sat.   Back and hips hurt all the time.  Walking humped over much of the time.  She describes her hip pain as posterior and does not have anterior groin pain.  R hip x-ray normal, 07/02/2011. These are not available for my review.   [place] 2 years ago, broken shoulder, but she denies a history of trauma or surgery to the hip or back.  Denies bowel or bladder incontinence. No saddle anesthesia.    Past Medical History  Diagnosis Date  . Hypertension   . ESRD on hemodialysis     Pt gets dialysis with  [facility] in [place], [place], on a TTS schedule.  She started dialysis in Feb 2014.  Etiology of renal failure not known, likely diabetes.  Has a left upper arm AV graft as of Dec 2014.    Marland Kitchen COPD (chronic obstructive pulmonary disease)   . Emphysema   . Pneumonia     June 2012  . Bronchitis     Mar 2012  . Peripheral vascular disease   . Chronic constipation   . Colon polyps   . Sickle cell trait   . Anemia   . Diabetes mellitus   . Heart murmur     Dr. Cornelius Moras  . Urinary tract infection     hx of  . Diabetic neuropathy   . Diabetic retinopathy 05/28/2013    Hx bilat retinal detachment, proliferative diab retinopathy and bilat vitreous hemorrhage   . Anemia in chronic kidney disease (CKD) 02/18/2012  . Hyperlipidemia   . Coronary artery disease   . Non-ST elevation MI (NSTEMI)     Past Surgical History  Procedure Laterality Date  . Abdominal hysterectomy      2000  . Dilation and curettage of uterus      several in the early 80's  . Eye surgery      bilateral laser 2012  .  Esophagogastroduodenoscopy      2012  . Colonscopy    . Tubal ligation      1979  . Pars plana vitrectomy  04/22/2011    Procedure: PARS PLANA VITRECTOMY WITH 25 GAUGE;  Surgeon: [name], MD;  Location: [place];  Service: Ophthalmology;  Laterality: Left;  membrane peel, endolaser, gas fluid exchange, silicone oil, repair of complex traction retinal detachment  . Pars plana vitrectomy  09/30/2011    Procedure: PARS PLANA VITRECTOMY WITH 25 GAUGE;  Surgeon: [name], MD;  Location: [place];  Service: Ophthalmology;  Laterality: Right;  Endolaser; Repair of Complex Traction Retinal Detachment  . Gas/fluid exchange  09/30/2011    Procedure: GAS/FLUID EXCHANGE;  Surgeon: [name], MD;  Location: [place];  Service: Ophthalmology;  Laterality: Right;  . Gas insertion  09/30/2011    Procedure: INSERTION OF GAS;  Surgeon: [name], MD;  Location: [place];  Service: Ophthalmology;  Laterality:  Right;  [id number]  . Eye surgery      right  . Pars plana vitrectomy  02/24/2012    Procedure: PARS PLANA VITRECTOMY WITH 25 GAUGE;  Surgeon: [name], MD;  Location: [place];  Service: Ophthalmology;  Laterality: Left;  . Silicon oil removal  1/61/0960    Procedure: SILICON OIL REMOVAL;  Surgeon: [name], MD;  Location: [place];  Service: Ophthalmology;  Laterality: Left;  . Ptca    . Thrombectomy / arteriovenous graft revision    . Cardiac catheterization    . Coronary angioplasty  05/28/2014    stent placement to the mid RCA    History   Social History  . Marital Status: Single    Spouse Name: N/A    Number of Children: 2  . Years of Education: N/A   Occupational History  . [place] History Main Topics  . Smoking status: Former Smoker -- 1.00 packs/day for 25 years    Quit date: 06/09/2010  . Smokeless tobacco: Never Used  . Alcohol Use: No  . Drug Use: No  . Sexual Activity: No   Other Topics Concern  . None   Social History Narrative  . None    Family History  Problem Relation Age of Onset  . Anesthesia problems Neg Hx   . Hypotension Neg Hx   . Malignant hyperthermia Neg Hx   . Pseudochol deficiency Neg Hx   . Stroke Mother   . Heart attack Mother   . Heart disease Mother   . Colon cancer Father   . Colon cancer Sister   . Heart attack Brother   . Stroke Brother   . Diabetes Brother   . Breast cancer Sister   . Diabetes Brother   . Diabetes Daughter     Medications and Allergies reviewed  Review of Systems:    GEN: No fevers, chills. Nontoxic. Primarily MSK c/o today. MSK: Detailed in the HPI GI: tolerating PO intake without difficulty Neuro: as above Otherwise, the pertinent positives and negatives are listed above and in the HPI, otherwise a full review of systems has been reviewed and is negative unless noted positive.   Physical Examination: BP 136/70  Pulse 76  Temp(Src) 98.4 F (36.9 C) (Oral)  Ht 5'  7.5" (1.715 m)  Wt 165 lb (74.844 kg)  BMI 25.45 kg/m2  SpO2 97%    GEN: Well-developed,well-nourished,in no acute distress; alert,appropriate and cooperative throughout examination HEENT: Normocephalic and atraumatic without  obvious abnormalities. Ears, externally no deformities PULM: Breathing comfortably in no respiratory distress EXT: No clubbing, cyanosis, or edema PSYCH: Normally interactive. Cooperative during the interview. Pleasant. Friendly and conversant. Not anxious or depressed appearing. Normal, full affect.  Range of motion at  the waist: Flexion, extension, lateral bending and rotation: relatively preserved with some terminal pain  No echymosis or edema Rises to examination table with mild difficulty Gait: minimally antalgic  Inspection/Deformity: N Paraspinus Tenderness: l2-s1 right and left  B Ankle Dorsiflexion (L5,4): 5/5 B Great Toe Dorsiflexion (L5,4): 5/5 Heel Walk (L5): WNL Toe Walk (S1): WNL Rise/Squat (L4): WNL, mild pain  SENSORY B Medial Foot (L4): WNL B Dorsum (L5): WNL B Lateral (S1): WNL Light Touch: WNL Pinprick: WNL  REFLEXES Knee (L4): 2+ Ankle (S1): 2+  B SLR, seated: neg B SLR, supine: pain in back only B FABER: neg B Reverse FABER: neg B Greater Troch: NT B Log Roll: neg B Stork: NT B Sciatic Notch: NT   HIP EXAM: SIDE: b ROM: Abduction, Flexion, Internal and External range of motion: approx 10 deg loss of IROM on R and L Pain with terminal IROM and EROM: posterior only GTB: NT SLR: NEG Knees: No effusion FABER: NT REVERSE FABER: NT, neg Piriformis: TTP Str: flexion: 5/5 abduction: 5/5 adduction: 5/5 Strength testing non-tender  Objective Data: Dg Lumbar Spine Complete  08/22/2013   CLINICAL DATA:  Lumbar radiculopathy  EXAM: LUMBAR SPINE - COMPLETE 4+ VIEW  COMPARISON:  None.  FINDINGS: Normal alignment. Mild narrowing of the L5-S1 interspace with adjacent discogenic sclerosis in the vertebral bodies and anterior  endplate spurring. Negative for fracture. Patchy aortic calcifications without suggestion of aneurysm.  IMPRESSION: 1. Negative for fracture or other acute bone abnormality. 2. Degenerative disc disease L5-S1.   Electronically Signed   By: Arne Cleveland M.D.   On: 08/22/2013 10:41   Dg Hip Complete Right  08/22/2013   CLINICAL DATA:  Right lower extremity radicular symptoms  EXAM: RIGHT HIP - COMPLETE 2+ VIEW  COMPARISON:  DG HIP COMPLETE*R* dated 06/30/2011  FINDINGS: The bony pelvis is adequately mineralized. There is no lytic or blastic lesion. The hip joint spaces are mildly narrowed bilaterally. This has slightly progressed since the previous study. The femoral heads and remain smoothly rounded. The observed portions of the sacrum are grossly normal.  AP and frogleg lateral views of the right hip reveal no acute bony abnormality. Again demonstrated is narrowing of the hip joint compartment.  IMPRESSION: There is degenerative narrowing of the right hip joint. There is no evidence of an acute fracture or dislocation.   Electronically Signed   By: David  Martinique   On: 08/22/2013 11:31    Assessment and Plan: Lumbar radiculopathy Osteoarthritis of right hip, seems to be minimally involved. DDD (degenerative disc disease), lumbosacral ESRD on hemodialysis NSTEMI (non-ST elevated myocardial infarction)  I suspect primary driver is DDD at J9-E1 with probable nerve encroachment at foramen bilaterally  Recommendations: 1. ESRD on HD and s/p recent PCI for NSTEMI presents significant challenges. 2. Lidocaine patches not approved by insurance. Trial of Lidocaine solution TID. 3. Ativan 0.5 mg at night for muscle relaxer. Most others cannot be used in ESRD, but ativan is eliminated almost completely by the liver. Has been of minimal benefit. 4. Continue HEP 5. Lyrica 100 mg daily is an excellent idea and can help radiculopathy, but I am hesitant to increase dose in HD patient. 6. Heat and massage  prn. 7. Interventions such as  ESI, spinal cord stimulator, or any other limited on plavix and PCI in 05/2013. 8. For now, more scheduled narcotics. Decrease dose to 1/4 tablet of percocet 5 mg to bid dosing.  9. Given prescription for TENS unit. 10. TCA's, Cymbalta, Savella would be contraindicated in this case.   Patient Instructions  1/4 Percocet tablet twice a day If that makes you too drowsy, then decrease to once a day  New Prescriptions   LIDOCAINE (XYLOCAINE) 4 % EXTERNAL SOLUTION    Apply topically 3 (three) times daily as needed.   We will see the patient back in 2 month.  Thank you for having [place] see Theresa [name] in consultation.  Feel free to contact me with any questions.  Updated Medication List: Patient's Medications  New Prescriptions   LIDOCAINE (XYLOCAINE) 4 % EXTERNAL SOLUTION    Apply topically 3 (three) times daily as needed.  Previous Medications   ALBUTEROL (PROAIR HFA) 108 (90 BASE) MCG/ACT INHALER    Inhale 1 puff into the lungs every 4 (four) hours as needed.    ALCOHOL SWABS PADS    Patient uses at home to assist with testing blood sugars twice daily.   ASPIRIN EC 81 MG TABLET    Take 81 mg by mouth daily.   B COMPLEX-C-FOLIC ACID (VOL-CARE RX) 1 MG TABS       BLOOD GLUCOSE MONITORING SUPPL (ONE TOUCH ULTRA SYSTEM KIT) W/DEVICE KIT    Please dispense for patient to use at home to check blood sugars.   CALCIUM ACETATE (PHOSLO) 667 MG CAPSULE       CARVEDILOL (COREG) 12.5 MG TABLET    Take 1 tablet (12.5 mg total) by mouth 2 (two) times daily.   CLOPIDOGREL (PLAVIX) 75 MG TABLET    Take 1 tablet (75 mg total) by mouth daily.   GLUCOSE BLOOD (ACCU-CHEK AVIVA PLUS) TEST STRIP    Patient is using at home to test blood sugars 2 times daily.   HYOSCYAMINE (LEVSIN SL) 0.125 MG SL TABLET       INSULIN DETEMIR 100 UNIT/ML SOPN    Inject 20 Units into the skin daily.   INSULIN PEN NEEDLE 32G X 6 MM MISC    1 Units by Does not apply route as needed.   LANCETS THIN MISC     Using at home to test blood sugars twice daily.   LORAZEPAM (ATIVAN) 0.5 MG TABLET    Take 1 tablet (0.5 mg total) by mouth at bedtime.   MOMETASONE (NASONEX) 50 MCG/ACT NASAL SPRAY    Place 2 sprays into the nose daily. 2 sprays by Each Nare route daily as needed.   NITROGLYCERIN (NITROSTAT) 0.4 MG SL TABLET    Place 1 tablet (0.4 mg total) under the tongue every 5 (five) minutes as needed for chest pain.   OXYCODONE-ACETAMINOPHEN (ROXICET) 5-325 MG PER TABLET    Take 1 tablet by mouth every 8 (eight) hours as needed for severe pain.   PREGABALIN (LYRICA) 100 MG CAPSULE    Take 100 mg by mouth daily.   ROSUVASTATIN (CRESTOR) 40 MG TABLET    Take 1 tablet (40 mg total) by mouth daily.  Modified Medications   No medications on file  Discontinued Medications   No medications on file     Signed,  Frederico Hamman T. Alma Muegge, MD, [place] at [facility] [address] [place], [place] [address] Phone: [phone] Fax: [phone]

## 2013-09-23 DIAGNOSIS — D631 Anemia in chronic kidney disease: Secondary | ICD-10-CM | POA: Diagnosis not present

## 2013-09-23 DIAGNOSIS — E1129 Type 2 diabetes mellitus with other diabetic kidney complication: Secondary | ICD-10-CM | POA: Diagnosis not present

## 2013-09-23 DIAGNOSIS — D509 Iron deficiency anemia, unspecified: Secondary | ICD-10-CM | POA: Diagnosis not present

## 2013-09-23 DIAGNOSIS — N186 End stage renal disease: Secondary | ICD-10-CM | POA: Diagnosis not present

## 2013-09-23 DIAGNOSIS — N2581 Secondary hyperparathyroidism of renal origin: Secondary | ICD-10-CM | POA: Diagnosis not present

## 2013-09-26 ENCOUNTER — Encounter: Payer: Self-pay | Admitting: Cardiovascular Disease

## 2013-09-26 ENCOUNTER — Ambulatory Visit: Payer: BC Managed Care – PPO | Admitting: Gastroenterology

## 2013-09-26 ENCOUNTER — Other Ambulatory Visit: Payer: Self-pay | Admitting: Internal Medicine

## 2013-09-26 DIAGNOSIS — E1129 Type 2 diabetes mellitus with other diabetic kidney complication: Secondary | ICD-10-CM | POA: Diagnosis not present

## 2013-09-26 DIAGNOSIS — D631 Anemia in chronic kidney disease: Secondary | ICD-10-CM | POA: Diagnosis not present

## 2013-09-26 DIAGNOSIS — Z5189 Encounter for other specified aftercare: Secondary | ICD-10-CM | POA: Diagnosis not present

## 2013-09-26 DIAGNOSIS — D509 Iron deficiency anemia, unspecified: Secondary | ICD-10-CM | POA: Diagnosis not present

## 2013-09-26 DIAGNOSIS — I509 Heart failure, unspecified: Secondary | ICD-10-CM | POA: Diagnosis not present

## 2013-09-26 DIAGNOSIS — N039 Chronic nephritic syndrome with unspecified morphologic changes: Secondary | ICD-10-CM | POA: Diagnosis not present

## 2013-09-26 DIAGNOSIS — I252 Old myocardial infarction: Secondary | ICD-10-CM | POA: Diagnosis not present

## 2013-09-26 DIAGNOSIS — N2581 Secondary hyperparathyroidism of renal origin: Secondary | ICD-10-CM | POA: Diagnosis not present

## 2013-09-26 DIAGNOSIS — N186 End stage renal disease: Secondary | ICD-10-CM | POA: Diagnosis not present

## 2013-09-28 DIAGNOSIS — N186 End stage renal disease: Secondary | ICD-10-CM | POA: Diagnosis not present

## 2013-09-28 DIAGNOSIS — N2581 Secondary hyperparathyroidism of renal origin: Secondary | ICD-10-CM | POA: Diagnosis not present

## 2013-09-28 DIAGNOSIS — D509 Iron deficiency anemia, unspecified: Secondary | ICD-10-CM | POA: Diagnosis not present

## 2013-09-28 DIAGNOSIS — E1129 Type 2 diabetes mellitus with other diabetic kidney complication: Secondary | ICD-10-CM | POA: Diagnosis not present

## 2013-09-28 DIAGNOSIS — D631 Anemia in chronic kidney disease: Secondary | ICD-10-CM | POA: Diagnosis not present

## 2013-09-29 ENCOUNTER — Telehealth: Payer: Self-pay

## 2013-09-29 NOTE — Telephone Encounter (Signed)
Relevant patient education mailed to patient.  

## 2013-09-30 ENCOUNTER — Emergency Department: Payer: Self-pay | Admitting: Emergency Medicine

## 2013-09-30 ENCOUNTER — Telehealth: Payer: Self-pay | Admitting: Internal Medicine

## 2013-09-30 DIAGNOSIS — Z7902 Long term (current) use of antithrombotics/antiplatelets: Secondary | ICD-10-CM | POA: Diagnosis not present

## 2013-09-30 DIAGNOSIS — Z79899 Other long term (current) drug therapy: Secondary | ICD-10-CM | POA: Diagnosis not present

## 2013-09-30 DIAGNOSIS — R109 Unspecified abdominal pain: Secondary | ICD-10-CM | POA: Diagnosis not present

## 2013-09-30 DIAGNOSIS — R9431 Abnormal electrocardiogram [ECG] [EKG]: Secondary | ICD-10-CM | POA: Diagnosis not present

## 2013-09-30 DIAGNOSIS — N186 End stage renal disease: Secondary | ICD-10-CM | POA: Diagnosis not present

## 2013-09-30 DIAGNOSIS — N2581 Secondary hyperparathyroidism of renal origin: Secondary | ICD-10-CM | POA: Diagnosis not present

## 2013-09-30 DIAGNOSIS — Z90710 Acquired absence of both cervix and uterus: Secondary | ICD-10-CM | POA: Diagnosis not present

## 2013-09-30 DIAGNOSIS — I120 Hypertensive chronic kidney disease with stage 5 chronic kidney disease or end stage renal disease: Secondary | ICD-10-CM | POA: Diagnosis not present

## 2013-09-30 DIAGNOSIS — J449 Chronic obstructive pulmonary disease, unspecified: Secondary | ICD-10-CM | POA: Diagnosis not present

## 2013-09-30 DIAGNOSIS — D509 Iron deficiency anemia, unspecified: Secondary | ICD-10-CM | POA: Diagnosis not present

## 2013-09-30 DIAGNOSIS — E785 Hyperlipidemia, unspecified: Secondary | ICD-10-CM | POA: Diagnosis not present

## 2013-09-30 DIAGNOSIS — Z794 Long term (current) use of insulin: Secondary | ICD-10-CM | POA: Diagnosis not present

## 2013-09-30 DIAGNOSIS — N039 Chronic nephritic syndrome with unspecified morphologic changes: Secondary | ICD-10-CM | POA: Diagnosis not present

## 2013-09-30 DIAGNOSIS — D631 Anemia in chronic kidney disease: Secondary | ICD-10-CM | POA: Diagnosis not present

## 2013-09-30 DIAGNOSIS — E119 Type 2 diabetes mellitus without complications: Secondary | ICD-10-CM | POA: Diagnosis not present

## 2013-09-30 DIAGNOSIS — Z862 Personal history of diseases of the blood and blood-forming organs and certain disorders involving the immune mechanism: Secondary | ICD-10-CM | POA: Diagnosis not present

## 2013-09-30 DIAGNOSIS — R1031 Right lower quadrant pain: Secondary | ICD-10-CM | POA: Diagnosis not present

## 2013-09-30 DIAGNOSIS — Z992 Dependence on renal dialysis: Secondary | ICD-10-CM | POA: Diagnosis not present

## 2013-09-30 DIAGNOSIS — E1129 Type 2 diabetes mellitus with other diabetic kidney complication: Secondary | ICD-10-CM | POA: Diagnosis not present

## 2013-09-30 DIAGNOSIS — R52 Pain, unspecified: Secondary | ICD-10-CM | POA: Diagnosis not present

## 2013-09-30 LAB — CBC
HCT: 34.7 % — ABNORMAL LOW (ref 35.0–47.0)
HGB: 11.4 g/dL — AB (ref 12.0–16.0)
MCH: 34.3 pg — ABNORMAL HIGH (ref 26.0–34.0)
MCHC: 33 g/dL (ref 32.0–36.0)
MCV: 104 fL — AB (ref 80–100)
Platelet: 200 10*3/uL (ref 150–440)
RBC: 3.34 10*6/uL — AB (ref 3.80–5.20)
RDW: 15.7 % — ABNORMAL HIGH (ref 11.5–14.5)
WBC: 9.2 10*3/uL (ref 3.6–11.0)

## 2013-09-30 LAB — COMPREHENSIVE METABOLIC PANEL
ALBUMIN: 3.3 g/dL — AB (ref 3.4–5.0)
ALK PHOS: 87 U/L
Anion Gap: 5 — ABNORMAL LOW (ref 7–16)
BUN: 23 mg/dL — AB (ref 7–18)
Bilirubin,Total: 0.4 mg/dL (ref 0.2–1.0)
CALCIUM: 9.1 mg/dL (ref 8.5–10.1)
CHLORIDE: 100 mmol/L (ref 98–107)
Co2: 31 mmol/L (ref 21–32)
Creatinine: 6 mg/dL — ABNORMAL HIGH (ref 0.60–1.30)
EGFR (Non-African Amer.): 7 — ABNORMAL LOW
GFR CALC AF AMER: 8 — AB
Glucose: 91 mg/dL (ref 65–99)
Osmolality: 275 (ref 275–301)
Potassium: 4.4 mmol/L (ref 3.5–5.1)
SGOT(AST): 16 U/L (ref 15–37)
SGPT (ALT): 19 U/L (ref 12–78)
Sodium: 136 mmol/L (ref 136–145)
Total Protein: 7.1 g/dL (ref 6.4–8.2)

## 2013-09-30 LAB — LIPASE, BLOOD: Lipase: 108 U/L (ref 73–393)

## 2013-09-30 NOTE — Telephone Encounter (Signed)
Patient Information:  Caller Name: Lizeth  Phone: [phone]  Patient: Theresa [name]  Gender: Female  DOB: 10-09-1955  Age: 58 Years  PCP: Ronette Deter (Adults only)  Office Follow Up:  Does the office need to follow up with this patient?: No  Instructions For The Office: N/A  RN Note:  RLQ pain, onset 4-23.   Pain worsen w/ walking.  Afebrile.  Abdominal protocol used, ED dispo d/t constant abdominal pain lasting over 2 hrs.  Consulted w/ office, RN agreed w/ ED dispo. Advised Pt to have someone else drive her.  Symptoms  Reason For Call & Symptoms: RLQ pain, onset 4-23.  Reviewed Health History In EMR: Yes  Reviewed Medications In EMR: Yes  Reviewed Allergies In EMR: Yes  Reviewed Surgeries / Procedures: Yes  Date of Onset of Symptoms: 09/29/2013  Guideline(s) Used:  Abdominal Pain - Female  Disposition Per Guideline:   Go to ED Now (or to Office with PCP Approval)  Reason For Disposition Reached:   Constant abdominal pain lasting > 2 hours  Advice Given:  N/A  Patient Will Follow Care Advice:  YES

## 2013-09-30 NOTE — Telephone Encounter (Signed)
FYI

## 2013-10-03 DIAGNOSIS — D509 Iron deficiency anemia, unspecified: Secondary | ICD-10-CM | POA: Diagnosis not present

## 2013-10-03 DIAGNOSIS — Z5189 Encounter for other specified aftercare: Secondary | ICD-10-CM | POA: Diagnosis not present

## 2013-10-03 DIAGNOSIS — D631 Anemia in chronic kidney disease: Secondary | ICD-10-CM | POA: Diagnosis not present

## 2013-10-03 DIAGNOSIS — I509 Heart failure, unspecified: Secondary | ICD-10-CM | POA: Diagnosis not present

## 2013-10-03 DIAGNOSIS — N2581 Secondary hyperparathyroidism of renal origin: Secondary | ICD-10-CM | POA: Diagnosis not present

## 2013-10-03 DIAGNOSIS — I252 Old myocardial infarction: Secondary | ICD-10-CM | POA: Diagnosis not present

## 2013-10-03 DIAGNOSIS — N186 End stage renal disease: Secondary | ICD-10-CM | POA: Diagnosis not present

## 2013-10-03 DIAGNOSIS — E1129 Type 2 diabetes mellitus with other diabetic kidney complication: Secondary | ICD-10-CM | POA: Diagnosis not present

## 2013-10-05 DIAGNOSIS — D509 Iron deficiency anemia, unspecified: Secondary | ICD-10-CM | POA: Diagnosis not present

## 2013-10-05 DIAGNOSIS — N186 End stage renal disease: Secondary | ICD-10-CM | POA: Diagnosis not present

## 2013-10-05 DIAGNOSIS — N2581 Secondary hyperparathyroidism of renal origin: Secondary | ICD-10-CM | POA: Diagnosis not present

## 2013-10-05 DIAGNOSIS — E1129 Type 2 diabetes mellitus with other diabetic kidney complication: Secondary | ICD-10-CM | POA: Diagnosis not present

## 2013-10-05 DIAGNOSIS — D631 Anemia in chronic kidney disease: Secondary | ICD-10-CM | POA: Diagnosis not present

## 2013-10-05 DIAGNOSIS — N039 Chronic nephritic syndrome with unspecified morphologic changes: Secondary | ICD-10-CM | POA: Diagnosis not present

## 2013-10-06 DIAGNOSIS — I509 Heart failure, unspecified: Secondary | ICD-10-CM | POA: Diagnosis not present

## 2013-10-06 DIAGNOSIS — I252 Old myocardial infarction: Secondary | ICD-10-CM | POA: Diagnosis not present

## 2013-10-06 DIAGNOSIS — Z5189 Encounter for other specified aftercare: Secondary | ICD-10-CM | POA: Diagnosis not present

## 2013-10-07 ENCOUNTER — Encounter: Payer: Self-pay | Admitting: Cardiovascular Disease

## 2013-10-07 DIAGNOSIS — D509 Iron deficiency anemia, unspecified: Secondary | ICD-10-CM | POA: Diagnosis not present

## 2013-10-07 DIAGNOSIS — D631 Anemia in chronic kidney disease: Secondary | ICD-10-CM | POA: Diagnosis not present

## 2013-10-07 DIAGNOSIS — I252 Old myocardial infarction: Secondary | ICD-10-CM | POA: Diagnosis not present

## 2013-10-07 DIAGNOSIS — N186 End stage renal disease: Secondary | ICD-10-CM | POA: Diagnosis not present

## 2013-10-07 DIAGNOSIS — E1129 Type 2 diabetes mellitus with other diabetic kidney complication: Secondary | ICD-10-CM | POA: Diagnosis not present

## 2013-10-07 DIAGNOSIS — Z5189 Encounter for other specified aftercare: Secondary | ICD-10-CM | POA: Diagnosis not present

## 2013-10-07 DIAGNOSIS — I509 Heart failure, unspecified: Secondary | ICD-10-CM | POA: Diagnosis not present

## 2013-10-07 DIAGNOSIS — N2581 Secondary hyperparathyroidism of renal origin: Secondary | ICD-10-CM | POA: Diagnosis not present

## 2013-10-10 ENCOUNTER — Inpatient Hospital Stay: Payer: Self-pay | Admitting: Internal Medicine

## 2013-10-10 DIAGNOSIS — N039 Chronic nephritic syndrome with unspecified morphologic changes: Secondary | ICD-10-CM | POA: Diagnosis not present

## 2013-10-10 DIAGNOSIS — R112 Nausea with vomiting, unspecified: Secondary | ICD-10-CM | POA: Diagnosis not present

## 2013-10-10 DIAGNOSIS — R1115 Cyclical vomiting syndrome unrelated to migraine: Secondary | ICD-10-CM | POA: Diagnosis not present

## 2013-10-10 DIAGNOSIS — I509 Heart failure, unspecified: Secondary | ICD-10-CM | POA: Diagnosis present

## 2013-10-10 DIAGNOSIS — Z87891 Personal history of nicotine dependence: Secondary | ICD-10-CM | POA: Diagnosis not present

## 2013-10-10 DIAGNOSIS — M1990 Unspecified osteoarthritis, unspecified site: Secondary | ICD-10-CM | POA: Diagnosis present

## 2013-10-10 DIAGNOSIS — K219 Gastro-esophageal reflux disease without esophagitis: Secondary | ICD-10-CM | POA: Diagnosis present

## 2013-10-10 DIAGNOSIS — Z800 Family history of malignant neoplasm of digestive organs: Secondary | ICD-10-CM | POA: Diagnosis not present

## 2013-10-10 DIAGNOSIS — K3184 Gastroparesis: Secondary | ICD-10-CM | POA: Diagnosis present

## 2013-10-10 DIAGNOSIS — N186 End stage renal disease: Secondary | ICD-10-CM | POA: Diagnosis not present

## 2013-10-10 DIAGNOSIS — Z8249 Family history of ischemic heart disease and other diseases of the circulatory system: Secondary | ICD-10-CM | POA: Diagnosis not present

## 2013-10-10 DIAGNOSIS — I120 Hypertensive chronic kidney disease with stage 5 chronic kidney disease or end stage renal disease: Secondary | ICD-10-CM | POA: Diagnosis not present

## 2013-10-10 DIAGNOSIS — Z833 Family history of diabetes mellitus: Secondary | ICD-10-CM | POA: Diagnosis not present

## 2013-10-10 DIAGNOSIS — Z7982 Long term (current) use of aspirin: Secondary | ICD-10-CM | POA: Diagnosis not present

## 2013-10-10 DIAGNOSIS — D631 Anemia in chronic kidney disease: Secondary | ICD-10-CM | POA: Diagnosis not present

## 2013-10-10 DIAGNOSIS — I252 Old myocardial infarction: Secondary | ICD-10-CM | POA: Diagnosis not present

## 2013-10-10 DIAGNOSIS — Z90710 Acquired absence of both cervix and uterus: Secondary | ICD-10-CM | POA: Diagnosis not present

## 2013-10-10 DIAGNOSIS — R0789 Other chest pain: Secondary | ICD-10-CM | POA: Diagnosis not present

## 2013-10-10 DIAGNOSIS — E785 Hyperlipidemia, unspecified: Secondary | ICD-10-CM | POA: Diagnosis not present

## 2013-10-10 DIAGNOSIS — I10 Essential (primary) hypertension: Secondary | ICD-10-CM | POA: Diagnosis not present

## 2013-10-10 DIAGNOSIS — Z794 Long term (current) use of insulin: Secondary | ICD-10-CM | POA: Diagnosis not present

## 2013-10-10 DIAGNOSIS — Z9861 Coronary angioplasty status: Secondary | ICD-10-CM | POA: Diagnosis not present

## 2013-10-10 DIAGNOSIS — E1149 Type 2 diabetes mellitus with other diabetic neurological complication: Secondary | ICD-10-CM | POA: Diagnosis present

## 2013-10-10 DIAGNOSIS — Z7902 Long term (current) use of antithrombotics/antiplatelets: Secondary | ICD-10-CM | POA: Diagnosis not present

## 2013-10-10 DIAGNOSIS — R079 Chest pain, unspecified: Secondary | ICD-10-CM | POA: Diagnosis not present

## 2013-10-10 DIAGNOSIS — Z79899 Other long term (current) drug therapy: Secondary | ICD-10-CM | POA: Diagnosis not present

## 2013-10-10 DIAGNOSIS — I2510 Atherosclerotic heart disease of native coronary artery without angina pectoris: Secondary | ICD-10-CM | POA: Diagnosis not present

## 2013-10-10 DIAGNOSIS — N2581 Secondary hyperparathyroidism of renal origin: Secondary | ICD-10-CM | POA: Diagnosis not present

## 2013-10-10 DIAGNOSIS — J449 Chronic obstructive pulmonary disease, unspecified: Secondary | ICD-10-CM | POA: Diagnosis not present

## 2013-10-10 DIAGNOSIS — E1142 Type 2 diabetes mellitus with diabetic polyneuropathy: Secondary | ICD-10-CM | POA: Diagnosis not present

## 2013-10-10 DIAGNOSIS — Z992 Dependence on renal dialysis: Secondary | ICD-10-CM | POA: Diagnosis not present

## 2013-10-10 DIAGNOSIS — I059 Rheumatic mitral valve disease, unspecified: Secondary | ICD-10-CM | POA: Diagnosis not present

## 2013-10-10 LAB — PROTIME-INR
INR: 1
Prothrombin Time: 13 secs (ref 11.5–14.7)

## 2013-10-10 LAB — COMPREHENSIVE METABOLIC PANEL
ALK PHOS: 79 U/L
ALT: 19 U/L (ref 12–78)
Albumin: 3.5 g/dL (ref 3.4–5.0)
Anion Gap: 6 — ABNORMAL LOW (ref 7–16)
BUN: 27 mg/dL — ABNORMAL HIGH (ref 7–18)
Bilirubin,Total: 0.5 mg/dL (ref 0.2–1.0)
Calcium, Total: 8.5 mg/dL (ref 8.5–10.1)
Chloride: 102 mmol/L (ref 98–107)
Co2: 30 mmol/L (ref 21–32)
Creatinine: 6.78 mg/dL — ABNORMAL HIGH (ref 0.60–1.30)
EGFR (African American): 7 — ABNORMAL LOW
EGFR (Non-African Amer.): 6 — ABNORMAL LOW
GLUCOSE: 79 mg/dL (ref 65–99)
OSMOLALITY: 280 (ref 275–301)
Potassium: 4.2 mmol/L (ref 3.5–5.1)
SGOT(AST): 26 U/L (ref 15–37)
SODIUM: 138 mmol/L (ref 136–145)
TOTAL PROTEIN: 7.1 g/dL (ref 6.4–8.2)

## 2013-10-10 LAB — TROPONIN I
TROPONIN-I: 0.03 ng/mL
Troponin-I: <0.02 ng/mL
Troponin-I: 0.02 ng/mL

## 2013-10-10 LAB — CBC
HCT: 37 % (ref 35.0–47.0)
HGB: 12.2 g/dL (ref 12.0–16.0)
MCH: 35 pg — AB (ref 26.0–34.0)
MCHC: 32.9 g/dL (ref 32.0–36.0)
MCV: 107 fL — AB (ref 80–100)
Platelet: 181 10*3/uL (ref 150–440)
RBC: 3.47 10*6/uL — AB (ref 3.80–5.20)
RDW: 16.9 % — ABNORMAL HIGH (ref 11.5–14.5)
WBC: 9.8 10*3/uL (ref 3.6–11.0)

## 2013-10-10 LAB — CK TOTAL AND CKMB (NOT AT ARMC)
CK, TOTAL: 57 U/L
CK, Total: 60 U/L
CK, Total: 54 U/L
CK-MB: 1.5 ng/mL (ref 0.5–3.6)
CK-MB: 1.3 ng/mL (ref 0.5–3.6)
CK-MB: 1.5 ng/mL (ref 0.5–3.6)

## 2013-10-10 LAB — APTT: Activated PTT: 28.5 secs (ref 23.6–35.9)

## 2013-10-13 ENCOUNTER — Telehealth: Payer: Self-pay

## 2013-10-13 NOTE — Telephone Encounter (Signed)
Patient contacted regarding discharge from [facility] on 10/12/13.  Patient understands to follow up with Ignacia Bayley, NP on 10/26/13 at 1:15 at [facility]. Patient understands discharge instructions? yes Patient understands medications and regiment? yes Patient understands to bring all medications to this visit? yes

## 2013-10-17 ENCOUNTER — Encounter: Payer: Self-pay | Admitting: Internal Medicine

## 2013-10-19 DIAGNOSIS — N186 End stage renal disease: Secondary | ICD-10-CM | POA: Diagnosis not present

## 2013-10-25 ENCOUNTER — Ambulatory Visit (INDEPENDENT_AMBULATORY_CARE_PROVIDER_SITE_OTHER): Payer: Medicare Other | Admitting: Internal Medicine

## 2013-10-25 ENCOUNTER — Encounter: Payer: Self-pay | Admitting: Internal Medicine

## 2013-10-25 VITALS — BP 120/60 | HR 73 | Temp 98.4°F | Ht 67.5 in | Wt 165.2 lb

## 2013-10-25 DIAGNOSIS — E118 Type 2 diabetes mellitus with unspecified complications: Secondary | ICD-10-CM | POA: Insufficient documentation

## 2013-10-25 DIAGNOSIS — I2510 Atherosclerotic heart disease of native coronary artery without angina pectoris: Secondary | ICD-10-CM | POA: Diagnosis not present

## 2013-10-25 DIAGNOSIS — E119 Type 2 diabetes mellitus without complications: Secondary | ICD-10-CM

## 2013-10-25 DIAGNOSIS — I519 Heart disease, unspecified: Secondary | ICD-10-CM | POA: Diagnosis not present

## 2013-10-25 DIAGNOSIS — N186 End stage renal disease: Secondary | ICD-10-CM

## 2013-10-25 DIAGNOSIS — R112 Nausea with vomiting, unspecified: Secondary | ICD-10-CM | POA: Diagnosis not present

## 2013-10-25 DIAGNOSIS — E1169 Type 2 diabetes mellitus with other specified complication: Secondary | ICD-10-CM

## 2013-10-25 DIAGNOSIS — Z992 Dependence on renal dialysis: Secondary | ICD-10-CM

## 2013-10-25 NOTE — Assessment & Plan Note (Signed)
Encouraged close monitoring of BG, particularly given recent issues with nausea. She will call if any BG<70. Continue Levemir 15units daily for now. Follow up with recheck A1c in 4 weeks.

## 2013-10-25 NOTE — Assessment & Plan Note (Signed)
Will set up podiatry referral for evaluation and management.

## 2013-10-25 NOTE — Progress Notes (Signed)
Pre visit review using our clinic review tool, if applicable. No additional management support is needed unless otherwise documented below in the visit note. 

## 2013-10-25 NOTE — Assessment & Plan Note (Signed)
Symptoms are improved after recent hospitalization. Symptoms seem consistent with acute viral gastroenteritis with underlying IBS. Encouraged her to keep follow up with Dr. Ardis Hughs as scheduled. Continue prn Phenergan for nausea. Follow up 4 weeks and prn.

## 2013-10-25 NOTE — Progress Notes (Signed)
Subjective:    Patient ID: Theresa [name], female    DOB: 1955/10/29, 58 y.o.   MRN: [id number]  HPI 58YO female with DM, ESRD presents for hospital follow up.   Admitted 5/4 with nausea and vomiting. Hypoglycemic with BG in 50s. Diagnosed with IBS. Treated with phenergan. Symptoms improved. Discharged 5/6. At home, has been tolerated food until yesterday. Vomited 2 times. No hematemesis. Today, tolerated sandwich with no nausea. Not taking the phernergan because makes her drowsy. Also had chest pain prior to admission. Cardiac workup including cardiac markers was normal. No recurrent chest pain since discharge.  Review of Systems  Constitutional: Negative for fever, chills, appetite change, fatigue and unexpected weight change.  HENT: Negative for congestion, ear pain, sinus pressure, sore throat, trouble swallowing and voice change.   Eyes: Negative for visual disturbance.  Respiratory: Negative for cough, shortness of breath, wheezing and stridor.   Cardiovascular: Negative for chest pain, palpitations and leg swelling.  Gastrointestinal: Positive for nausea and vomiting (now resolved). Negative for abdominal pain, diarrhea, constipation, blood in stool, abdominal distention and anal bleeding.  Genitourinary: Negative for dysuria and flank pain.  Musculoskeletal: Negative for arthralgias, gait problem, myalgias and neck pain.  Skin: Negative for color change and rash.  Neurological: Negative for dizziness and headaches.  Hematological: Negative for adenopathy. Does not bruise/bleed easily.  Psychiatric/Behavioral: Negative for suicidal ideas, sleep disturbance and dysphoric mood. The patient is not nervous/anxious.        Objective:    BP 120/60  Pulse 73  Temp(Src) 98.4 F (36.9 C) (Oral)  Ht 5' 7.5" (1.715 m)  Wt 165 lb 4 oz (74.957 kg)  BMI 25.48 kg/m2  SpO2 95% Physical Exam  Constitutional: She is oriented to person, place, and time. She appears well-developed and  well-nourished. No distress.  HENT:  Head: Normocephalic and atraumatic.  Right Ear: External ear normal.  Left Ear: External ear normal.  Nose: Nose normal.  Mouth/Throat: Oropharynx is clear and moist. No oropharyngeal exudate.  Eyes: Conjunctivae are normal. Pupils are equal, round, and reactive to light. Right eye exhibits no discharge. Left eye exhibits no discharge. No scleral icterus.  Neck: Normal range of motion. Neck supple. No tracheal deviation present. No thyromegaly present.  Cardiovascular: Normal rate, regular rhythm, normal heart sounds and intact distal pulses.  Exam reveals no gallop and no friction rub.   No murmur heard. Pulmonary/Chest: Effort normal and breath sounds normal. No accessory muscle usage. Not tachypneic. No respiratory distress. She has no decreased breath sounds. She has no wheezes. She has no rhonchi. She has no rales. She exhibits no tenderness.  Abdominal: Soft. Bowel sounds are normal. She exhibits no distension and no mass. There is no tenderness. There is no rebound and no guarding.  Musculoskeletal: Normal range of motion. She exhibits no edema and no tenderness.  Lymphadenopathy:    She has no cervical adenopathy.  Neurological: She is alert and oriented to person, place, and time. No cranial nerve deficit. She exhibits normal muscle tone. Coordination normal.  Skin: Skin is warm and dry. No rash noted. She is not diaphoretic. No erythema. No pallor.  Psychiatric: She has a normal mood and affect. Her behavior is normal. Judgment and thought content normal.          Assessment & Plan:  Over 55min of which >50% spent in face-to-face contact with patient discussing plan of care  Problem List Items Addressed This Visit   Diabetes mellitus, type II  Encouraged close monitoring of BG, particularly given recent issues with nausea. She will call if any BG<70. Continue Levemir 15units daily for now. Follow up with recheck A1c in 4 weeks.     Diabetic foot     Will set up podiatry referral for evaluation and management.    Relevant Orders      Ambulatory referral to Podiatry   ESRD on hemodialysis     Continue HD TTS. AV graft functioning well. Pt may be candidate for renal transplant after 1 year on Plavix.    Nausea with vomiting - Primary     Symptoms are improved after recent hospitalization. Symptoms seem consistent with acute viral gastroenteritis with underlying IBS. Encouraged her to keep follow up with Dr. Ardis Hughs as scheduled. Continue prn Phenergan for nausea. Follow up 4 weeks and prn.    Systolic dysfunction, left ventricle       Return in about 4 weeks (around 11/22/2013) for Recheck of Diabetes.

## 2013-10-25 NOTE — Assessment & Plan Note (Signed)
Continue HD TTS. AV graft functioning well. Pt may be candidate for renal transplant after 1 year on Plavix.

## 2013-10-26 ENCOUNTER — Ambulatory Visit (INDEPENDENT_AMBULATORY_CARE_PROVIDER_SITE_OTHER): Payer: Medicare Other | Admitting: Nurse Practitioner

## 2013-10-26 ENCOUNTER — Encounter: Payer: Self-pay | Admitting: Nurse Practitioner

## 2013-10-26 VITALS — BP 150/68 | HR 82 | Ht 67.5 in | Wt 164.0 lb

## 2013-10-26 DIAGNOSIS — R079 Chest pain, unspecified: Secondary | ICD-10-CM

## 2013-10-26 DIAGNOSIS — I2510 Atherosclerotic heart disease of native coronary artery without angina pectoris: Secondary | ICD-10-CM

## 2013-10-26 DIAGNOSIS — I10 Essential (primary) hypertension: Secondary | ICD-10-CM

## 2013-10-26 DIAGNOSIS — E785 Hyperlipidemia, unspecified: Secondary | ICD-10-CM

## 2013-10-26 DIAGNOSIS — R0789 Other chest pain: Secondary | ICD-10-CM

## 2013-10-26 DIAGNOSIS — E119 Type 2 diabetes mellitus without complications: Secondary | ICD-10-CM

## 2013-10-26 DIAGNOSIS — Z992 Dependence on renal dialysis: Secondary | ICD-10-CM

## 2013-10-26 DIAGNOSIS — N186 End stage renal disease: Secondary | ICD-10-CM

## 2013-10-26 DIAGNOSIS — R112 Nausea with vomiting, unspecified: Secondary | ICD-10-CM

## 2013-10-26 NOTE — Patient Instructions (Addendum)
[place]  Your caregiver has ordered a Stress Test with nuclear imaging. The purpose of this test is to evaluate the blood supply to your heart muscle. This procedure is referred to as a "Non-Invasive Stress Test." This is because other than having an IV started in your vein, nothing is inserted or "invades" your body. Cardiac stress tests are done to find areas of poor blood flow to the heart by determining the extent of coronary artery disease (CAD). Some patients exercise on a treadmill, which naturally increases the blood flow to your heart, while others who are  unable to walk on a treadmill due to physical limitations have a pharmacologic/chemical stress agent called Lexiscan . This medicine will mimic walking on a treadmill by temporarily increasing your coronary blood flow.   Please note: these test may take anywhere between 2-4 hours to complete  PLEASE REPORT TO [place] AT THE FIRST DESK WILL DIRECT YOU WHERE TO GO  Date of Procedure:____________5/26/15________________________  Arrival Time for Procedure:__________0745 am____________________  Instructions regarding medication:   __x__ : Hold diabetes medication morning of procedure  __x__:  Hold betablocker(s) night before procedure and morning of procedure:                Coreg     PLEASE NOTIFY THE OFFICE AT LEAST 24 HOURS IN ADVANCE IF YOU ARE UNABLE TO KEEP YOUR APPOINTMENT.  [phone] AND  PLEASE NOTIFY NUCLEAR MEDICINE AT [facility] AT LEAST 24 HOURS IN ADVANCE IF YOU ARE UNABLE TO KEEP YOUR APPOINTMENT. [phone]  How to prepare for your Myoview test:  1. Do not eat or drink after midnight 2. No caffeine for 24 hours prior to test 3. No smoking 24 hours prior to test. 4. Your medication may be taken with water.  If your doctor stopped a medication because of this test, do not take that medication. 5. Ladies, please do not wear dresses.  Skirts or pants are appropriate. Please  wear a short sleeve shirt. 6. No perfume, cologne or lotion. 7. Wear comfortable walking shoes. No heels!   Your physician recommends that you schedule a follow-up appointment in:  Dr. Rockey Situ in 3 months

## 2013-10-26 NOTE — Progress Notes (Signed)
Patient Name: Theresa [name] Date of Encounter: 10/26/2013  Primary Care Provider:  Rica Mast, MD Primary Cardiologist:  Johnny Bridge, MD   Patient Profile  58 y/o female with a h/o CAD and ESRD who was recently admitted to [facility] with n/v and c/p and now presents for f/u.  Problem List   Past Medical History  Diagnosis Date  . Hypertension   . ESRD on hemodialysis     a. [facility] in [place], [place], on a TTS schedule.  She started dialysis in Feb 2014.  Etiology of renal failure not known, likely diabetes.  Has a left upper arm AV graft.  Marland Kitchen COPD (chronic obstructive pulmonary disease)   . Emphysema   . History of pneumonia     June 2012  . History of bronchitis     Mar 2012  . Peripheral vascular disease   . Chronic constipation   . Colon polyps   . Sickle cell trait   . Anemia of chronic disease   . Diabetes mellitus   . Moderate mitral insufficiency     a. 10/2013 Echo: EF 55-60%, mod MR.  . Diabetic neuropathy   . Diabetic retinopathy 05/28/2013    Hx bilat retinal detachment, proliferative diab retinopathy and bilat vitreous hemorrhage   . Hyperlipidemia   . Coronary artery disease     a. 05/2013 NSTEMI/PCI: LM 20d, LAD min irregs, LCX small, nl, OM1 nl, RCA dom 21m(2.5x16 Promus DES), PDA1 80p.  . Chronic diastolic CHF (congestive heart failure)     a. 10/2013 Echo ([facility]: EF 55-60%, mod conc LVH, mod MR, mildly dil LA, mild Ao sclerosis w/o stenosis.  . Carotid arterial disease     a. 02/2013 U/S: 40-59% bilat ICA stenosis - *f/u 02/2014*  . History of tobacco abuse     a. Quit 2012.   Past Surgical History  Procedure Laterality Date  . Abdominal hysterectomy      2000  . Dilation and curettage of uterus      several in the early 80's  . Eye surgery      bilateral laser 2012  . Esophagogastroduodenoscopy      2012  . Colonscopy    . Tubal ligation      1979  . Pars plana vitrectomy  04/22/2011    Procedure: PARS PLANA VITRECTOMY WITH 25 GAUGE;   Surgeon: JHayden Pedro MD;  [place]  Service: Ophthalmology;  Laterality: Left;  membrane peel, endolaser, gas fluid exchange, silicone oil, repair of complex traction retinal detachment  . Pars plana vitrectomy  09/30/2011    Procedure: PARS PLANA VITRECTOMY WITH 25 GAUGE;  Surgeon: JHayden Pedro MD;  [place]  Service: Ophthalmology;  Laterality: Right;  Endolaser; Repair of Complex Traction Retinal Detachment  . Gas/fluid exchange  09/30/2011    Procedure: GAS/FLUID EXCHANGE;  Surgeon: JHayden Pedro MD;  [place]  Service: Ophthalmology;  Laterality: Right;  . Gas insertion  09/30/2011    Procedure: INSERTION OF GAS;  Surgeon: JHayden Pedro MD;  [place]  Service: Ophthalmology;  Laterality: Right;  C3F8  . Eye surgery      right  . Pars plana vitrectomy  02/24/2012    Procedure: PARS PLANA VITRECTOMY WITH 25 GAUGE;  Surgeon: JHayden Pedro MD;  [place]  Service: Ophthalmology;  Laterality: Left;  . Silicon oil removal  92/44/0102   Procedure: SILICON OIL REMOVAL;  Surgeon: JHayden Pedro MD;  Location: [place] [facility];  Service: Ophthalmology;  Laterality: Left;  . Ptca    . Thrombectomy / arteriovenous graft revision    . Cardiac catheterization    . Coronary angioplasty  05/28/2014    stent placement to the mid RCA    Allergies  Allergies  Allergen Reactions  . Hydrocodone   . Metformin     Other reaction(s): Distress (finding)    HPI  58 y/o female with the above PMH. She is s/p NSTEMI in 05/2013 with PCI/DES to the RCA.  She had minimal residual LAD dzs along with an 80% stenosis w/in the RPDA, which has been medically managed.  She has been on ASA, plavix, bb, and statin since.  She was admitted to [facility] earlier this month w/ c/o chest pain, nausea, and vomiting. She ruled out for MI.  Echo showed nl LV fxn.  MV was not able to be performed b/c she had persistent n/v.  She was seen by GI, who did not feel that EGD was indicated and  she was treated conservatively for presumed gastroenteritis.  She has an appt with Dr. Ardis Hughs ([facility]) on 6/9.  Since d/c she has had intermittent n/v - last episode 5/18.  She was able to eat a sandwich for lunch today w/o n/v.  She has not had any additional c/p.  She denies palpitations, dyspnea, pnd, orthopnea, n, v, dizziness, syncope, edema, weight gain, or early satiety.   She went to dialysis this morning and says that she feels sleepy. She has yet to take her AM BP meds and her BP is mod elevated.  Home Medications  Prior to Admission medications   Medication Sig Start Date End Date Taking? Authorizing Provider  albuterol (PROAIR HFA) 108 (90 BASE) MCG/ACT inhaler Inhale 1 puff into the lungs every 4 (four) hours as needed.    Yes Historical Provider, MD  Alcohol Swabs PADS Patient uses at home to assist with testing blood sugars twice daily. 07/25/13  Yes Jackolyn Confer, MD  aspirin EC 81 MG tablet Take 81 mg by mouth daily.   Yes Historical Provider, MD  B Complex-C-Folic Acid (VOL-CARE RX) 1 MG TABS  07/08/13  Yes Historical Provider, MD  BAYER CONTOUR TEST test strip TEST BLOOD SUGARS 2 TIMES DAILY 09/26/13  Yes Jackolyn Confer, MD  Blood Glucose Monitoring Suppl (ONE TOUCH ULTRA SYSTEM KIT) W/DEVICE KIT Please dispense for patient to use at home to check blood sugars. 07/27/13  Yes Jackolyn Confer, MD  calcium acetate (PHOSLO) 667 MG capsule  08/06/13  Yes Historical Provider, MD  carvedilol (COREG) 12.5 MG tablet Take 1 tablet (12.5 mg total) by mouth 2 (two) times daily. 06/03/13  Yes Rhonda G Barrett, PA-C  clopidogrel (PLAVIX) 75 MG tablet Take 1 tablet (75 mg total) by mouth daily. 09/02/13  Yes Jackolyn Confer, MD  hyoscyamine (LEVSIN SL) 0.125 MG SL tablet  06/27/13  Yes Historical Provider, MD  Insulin Detemir 100 UNIT/ML SOPN Inject 20 Units into the skin daily. 04/19/13  Yes Jackolyn Confer, MD  Insulin Pen Needle 32G X 6 MM MISC 1 Units by Does not apply route as  needed. 08/08/13  Yes Jackolyn Confer, MD  Lancets Thin MISC Using at home to test blood sugars twice daily. 07/25/13  Yes Jackolyn Confer, MD  lidocaine (XYLOCAINE) 4 % external solution Apply topically 3 (three) times daily as needed. 09/22/13  Yes Owens Loffler, MD  LORazepam (ATIVAN) 0.5 MG tablet Take  1 tablet (0.5 mg total) by mouth at bedtime. 08/22/13  Yes Spencer Copland, MD  mometasone (NASONEX) 50 MCG/ACT nasal spray Place 2 sprays into the nose daily. 2 sprays by Each Nare route daily as needed.   Yes Historical Provider, MD  nitroGLYCERIN (NITROSTAT) 0.4 MG SL tablet Place 1 tablet (0.4 mg total) under the tongue every 5 (five) minutes as needed for chest pain. 06/03/13  Yes Rhonda G Barrett, PA-C  oxyCODONE-acetaminophen (ROXICET) 5-325 MG per tablet Take 1 tablet by mouth every 8 (eight) hours as needed for severe pain. 08/08/13  Yes Jackolyn Confer, MD  pregabalin (LYRICA) 75 MG capsule Take 75 mg by mouth daily.   Yes Historical Provider, MD  rosuvastatin (CRESTOR) 40 MG tablet Take 1 tablet (40 mg total) by mouth daily. 11/12/12  Yes Jackolyn Confer, MD    Review of Systems  Ongoing intermittent n/v as above.  She denies chest pain, palpitations, dyspnea, pnd, orthopnea, n, v, dizziness, syncope, edema, weight gain, or early satiety.  All other systems reviewed and are otherwise negative except as noted above.  Physical Exam  Blood pressure 150/68, pulse 82, height 5' 7.5" (1.715 m), weight 164 lb (74.39 kg).  General: Pleasant, NAD Psych: Normal affect. Neuro: Alert and oriented X 3. Moves all extremities spontaneously. HEENT: Normal  Neck: Supple without JVD.  L>R bruit (? Radiated bruit from L FA AVF) Lungs:  Resp regular and unlabored, bibasilar crackles. Heart: RRR no s3, s4, 3/6 SEM loudest @ RUSB - heard throughout. Abdomen: Soft, non-tender, non-distended, BS + x 4.  Extremities: No clubbing, cyanosis or edema. DP/PT/Radials 2+ and equal bilaterally.  Accessory  Clinical Findings  ECG - rsr, 82, LAE, LVH, no acute ST/T changes.  Assessment & Plan  1.  Midsternal chest pain/CAD:  Recently admitted with atypical c/p in the setting of n/v.  Myoview was recommended but could not be carried out 2/2 ongoing N/V.  She is feeling mostly better and would like to proceed w/ Ex MV.  She has not been having c/p or dyspnea.  We will arrange for Ex MV next Tuesday.  Cont asa, statin, bb, plavix.  2.  HTN:  BP elevated today however she just came back from dialysis and has yet to take her morning BP meds.  She says SBP was 114 when she got off of dialysis.  Cont current meds - no adjustments today.  She will take her meds when she gets home.  3.  HL:  On statin.  4.  DM:  On levemir.  Followed by primary care.  5.  ESRD:  On dialysis.  6.  Carotid arterial dzs:  With bruits.  40-59% bilat stenosis by u/s in 02/2013.  Needs f/u in 02/2014.    7.  Nausea and Vomiting:  Improving.  She has f/u with Dr. Ardis Hughs on 6/9.  8.  Dispo:  Ex MV next week.  F/U w/ Dr. Rockey Situ in 3 mos or sooner if MV abnl.    Rogelia Mire, NP 10/26/2013, 1:28 PM

## 2013-11-01 ENCOUNTER — Ambulatory Visit: Payer: Self-pay | Admitting: Cardiovascular Disease

## 2013-11-01 DIAGNOSIS — R079 Chest pain, unspecified: Secondary | ICD-10-CM

## 2013-11-02 ENCOUNTER — Other Ambulatory Visit: Payer: Self-pay

## 2013-11-02 ENCOUNTER — Telehealth: Payer: Self-pay

## 2013-11-02 DIAGNOSIS — R079 Chest pain, unspecified: Secondary | ICD-10-CM

## 2013-11-02 NOTE — Telephone Encounter (Signed)
LVM 5/27

## 2013-11-02 NOTE — Telephone Encounter (Signed)
Pt would like stress test results. Please call. 

## 2013-11-02 NOTE — Telephone Encounter (Signed)
See result note.  

## 2013-11-07 DIAGNOSIS — N039 Chronic nephritic syndrome with unspecified morphologic changes: Secondary | ICD-10-CM | POA: Diagnosis not present

## 2013-11-07 DIAGNOSIS — E1129 Type 2 diabetes mellitus with other diabetic kidney complication: Secondary | ICD-10-CM | POA: Diagnosis not present

## 2013-11-07 DIAGNOSIS — N186 End stage renal disease: Secondary | ICD-10-CM | POA: Diagnosis not present

## 2013-11-07 DIAGNOSIS — N2581 Secondary hyperparathyroidism of renal origin: Secondary | ICD-10-CM | POA: Diagnosis not present

## 2013-11-07 DIAGNOSIS — D509 Iron deficiency anemia, unspecified: Secondary | ICD-10-CM | POA: Diagnosis not present

## 2013-11-07 DIAGNOSIS — D631 Anemia in chronic kidney disease: Secondary | ICD-10-CM | POA: Diagnosis not present

## 2013-11-09 ENCOUNTER — Emergency Department: Payer: Self-pay | Admitting: Emergency Medicine

## 2013-11-09 DIAGNOSIS — N186 End stage renal disease: Secondary | ICD-10-CM | POA: Diagnosis not present

## 2013-11-09 DIAGNOSIS — I120 Hypertensive chronic kidney disease with stage 5 chronic kidney disease or end stage renal disease: Secondary | ICD-10-CM | POA: Diagnosis not present

## 2013-11-09 DIAGNOSIS — R252 Cramp and spasm: Secondary | ICD-10-CM | POA: Diagnosis not present

## 2013-11-09 DIAGNOSIS — Z79899 Other long term (current) drug therapy: Secondary | ICD-10-CM | POA: Diagnosis not present

## 2013-11-09 DIAGNOSIS — M79609 Pain in unspecified limb: Secondary | ICD-10-CM | POA: Diagnosis not present

## 2013-11-09 DIAGNOSIS — Z992 Dependence on renal dialysis: Secondary | ICD-10-CM | POA: Diagnosis not present

## 2013-11-09 LAB — COMPREHENSIVE METABOLIC PANEL
ALK PHOS: 87 U/L
Albumin: 3.3 g/dL — ABNORMAL LOW (ref 3.4–5.0)
Anion Gap: 6 — ABNORMAL LOW (ref 7–16)
BUN: 20 mg/dL — AB (ref 7–18)
Bilirubin,Total: 0.4 mg/dL (ref 0.2–1.0)
CALCIUM: 8.7 mg/dL (ref 8.5–10.1)
CO2: 31 mmol/L (ref 21–32)
Chloride: 100 mmol/L (ref 98–107)
Creatinine: 5.78 mg/dL — ABNORMAL HIGH (ref 0.60–1.30)
EGFR (Non-African Amer.): 8 — ABNORMAL LOW
GFR CALC AF AMER: 9 — AB
Glucose: 119 mg/dL — ABNORMAL HIGH (ref 65–99)
Osmolality: 278 (ref 275–301)
Potassium: 3.8 mmol/L (ref 3.5–5.1)
SGOT(AST): 28 U/L (ref 15–37)
SGPT (ALT): 37 U/L (ref 12–78)
Sodium: 137 mmol/L (ref 136–145)
TOTAL PROTEIN: 6.8 g/dL (ref 6.4–8.2)

## 2013-11-09 LAB — CBC WITH DIFFERENTIAL/PLATELET
Basophil #: 0.1 10*3/uL (ref 0.0–0.1)
Basophil %: 0.7 %
EOS ABS: 0.5 10*3/uL (ref 0.0–0.7)
EOS PCT: 5 %
HCT: 34.3 % — ABNORMAL LOW (ref 35.0–47.0)
HGB: 11.4 g/dL — AB (ref 12.0–16.0)
LYMPHS ABS: 1.6 10*3/uL (ref 1.0–3.6)
LYMPHS PCT: 17 %
MCH: 35.5 pg — ABNORMAL HIGH (ref 26.0–34.0)
MCHC: 33.2 g/dL (ref 32.0–36.0)
MCV: 107 fL — ABNORMAL HIGH (ref 80–100)
Monocyte #: 0.5 x10 3/mm (ref 0.2–0.9)
Monocyte %: 5.3 %
NEUTROS PCT: 72 %
Neutrophil #: 6.6 10*3/uL — ABNORMAL HIGH (ref 1.4–6.5)
Platelet: 152 10*3/uL (ref 150–440)
RBC: 3.21 10*6/uL — ABNORMAL LOW (ref 3.80–5.20)
RDW: 15.3 % — ABNORMAL HIGH (ref 11.5–14.5)
WBC: 9.2 10*3/uL (ref 3.6–11.0)

## 2013-11-09 LAB — TROPONIN I: TROPONIN-I: 0.03 ng/mL

## 2013-11-10 ENCOUNTER — Encounter: Payer: Self-pay | Admitting: Cardiovascular Disease

## 2013-11-10 DIAGNOSIS — I509 Heart failure, unspecified: Secondary | ICD-10-CM | POA: Diagnosis not present

## 2013-11-10 DIAGNOSIS — R112 Nausea with vomiting, unspecified: Secondary | ICD-10-CM

## 2013-11-10 DIAGNOSIS — I252 Old myocardial infarction: Secondary | ICD-10-CM | POA: Diagnosis not present

## 2013-11-10 DIAGNOSIS — I2510 Atherosclerotic heart disease of native coronary artery without angina pectoris: Secondary | ICD-10-CM | POA: Diagnosis not present

## 2013-11-10 DIAGNOSIS — I10 Essential (primary) hypertension: Secondary | ICD-10-CM | POA: Diagnosis not present

## 2013-11-10 DIAGNOSIS — N186 End stage renal disease: Secondary | ICD-10-CM

## 2013-11-10 DIAGNOSIS — R072 Precordial pain: Secondary | ICD-10-CM

## 2013-11-10 DIAGNOSIS — E785 Hyperlipidemia, unspecified: Secondary | ICD-10-CM

## 2013-11-10 DIAGNOSIS — Z5189 Encounter for other specified aftercare: Secondary | ICD-10-CM | POA: Diagnosis not present

## 2013-11-10 DIAGNOSIS — E119 Type 2 diabetes mellitus without complications: Secondary | ICD-10-CM

## 2013-11-10 DIAGNOSIS — R079 Chest pain, unspecified: Secondary | ICD-10-CM

## 2013-11-14 DIAGNOSIS — I509 Heart failure, unspecified: Secondary | ICD-10-CM | POA: Diagnosis not present

## 2013-11-14 DIAGNOSIS — I252 Old myocardial infarction: Secondary | ICD-10-CM | POA: Diagnosis not present

## 2013-11-14 DIAGNOSIS — Z5189 Encounter for other specified aftercare: Secondary | ICD-10-CM | POA: Diagnosis not present

## 2013-11-15 ENCOUNTER — Ambulatory Visit (INDEPENDENT_AMBULATORY_CARE_PROVIDER_SITE_OTHER): Payer: Medicare Other | Admitting: Gastroenterology

## 2013-11-15 ENCOUNTER — Encounter: Payer: Self-pay | Admitting: Gastroenterology

## 2013-11-15 VITALS — BP 138/78 | HR 72 | Ht 67.0 in | Wt 168.0 lb

## 2013-11-15 DIAGNOSIS — K5900 Constipation, unspecified: Secondary | ICD-10-CM

## 2013-11-15 NOTE — Progress Notes (Signed)
Review of pertinent gastrointestinal problems: 1. Previous colon polyp: colonoscopy 02/2011 by Dr. Dionne Milo for IDA: single 69m polyp was removed;She was not sure what path showed. Pathology requested from Dr. IRuel Favorsoffice 07/2013, never received. 2. IDA: EGD done9/2012 Dr. IDionne Milo "medium sized AVM" was found in the stomach. Biopsies were done of duodenum (also requested 07/2013 but never received). SB Capsule was to be done but machine was broken and patient tells me that the test was never rescheduled. 3. C. Diff: [facility] Only labs I reviewed showed C. Diff negative 06/2013; she was put on flagyl.    HPI: This is a very pleasant 58year old woman whom I last saw about 4 months ago.   Weight is up 1 pound since 4 months ago (last GI office visit)  Has been feeling pretty good.   She went to hospital this past May,  with abdominal pain, nausea. she tells me she had another CT scan and that they couldn't find anything.   I don't have any records from that visit in the emergency room, labs or scan reports.  The diarrhea from several months ago is gone, but now she is bothered by constipation. She is still taking Imodium daily.  Scibbolous stools every 3-4 days only.  She takes oxycodone for the past 2 months for her back pains. 1/2 pill once per day.   Past Medical History  Diagnosis Date  . Hypertension   . ESRD on hemodialysis     [place] [facility] [place].  She started dialysis in Feb 2014.  Etiology of renal failure not known, likely diabetes.  Has a left upper arm AV graft.  .Marland KitchenCOPD (chronic obstructive pulmonary disease)   . Emphysema   . History of pneumonia     June 2012  . History of bronchitis     Mar 2012  . Peripheral vascular disease   . Chronic constipation   . Colon polyps   . Sickle cell trait   . Anemia of chronic disease   . Diabetes mellitus   . Moderate mitral  insufficiency     a. 10/2013 Echo: EF 55-60%, mod MR.  . Diabetic neuropathy   . Diabetic retinopathy 05/28/2013    Hx bilat retinal detachment, proliferative diab retinopathy and bilat vitreous hemorrhage   . Hyperlipidemia   . Coronary artery disease     a. 05/2013 NSTEMI/PCI: LM 20d, LAD min irregs, LCX small, nl, OM1 nl, RCA dom 935m2.5x16 Promus DES), PDA1 80p.  . Chronic diastolic CHF (congestive heart failure)     [facility] mod conc LVH, mod MR, mildly dil LA, mild Ao sclerosis w/o stenosis.  . Carotid arterial disease     a. 02/2013 U/S: 40-59% bilat ICA stenosis - *f/u 02/2014*  . History of tobacco abuse     a. Quit 2012.    Past Surgical History  Procedure Laterality Date  . Abdominal hysterectomy      2000  . Dilation and curettage of uterus      several in the early 80's  . Eye surgery      bilateral laser 2012  . Esophagogastroduodenoscopy      2012  . Colonscopy    . Tubal ligation      1979  . Pars plana vitrectomy  04/22/2011    Procedure: PARS PLANA VITRECTOMY WITH 25 GAUGE;  Surgeon: JoChrystie Nose  Zigmund , MD;  Location: [place];  Service: Ophthalmology;  Laterality: Left;  membrane peel, endolaser, gas fluid exchange, silicone oil, repair of complex traction retinal detachment  . Pars plana vitrectomy  09/30/2011    Procedure: PARS PLANA VITRECTOMY WITH 25 GAUGE;  Surgeon: [name], MD;  Location: [place];  Service: Ophthalmology;  Laterality: Right;  Endolaser; Repair of Complex Traction Retinal Detachment  . Gas/fluid exchange  09/30/2011    Procedure: GAS/FLUID EXCHANGE;  Surgeon: [name], MD;  Location: [place];  Service: Ophthalmology;  Laterality: Right;  . Gas insertion  09/30/2011    Procedure: INSERTION OF GAS;  Surgeon: [name], MD;  Location: [place];  Service: Ophthalmology;  Laterality: Right;  C3F8  . Eye surgery      right  . Pars plana vitrectomy  02/24/2012    Procedure: PARS PLANA VITRECTOMY WITH 25 GAUGE;   Surgeon: [name], MD;  Location: [place];  Service: Ophthalmology;  Laterality: Left;  . Silicon oil removal  0/12/6224    Procedure: SILICON OIL REMOVAL;  Surgeon: [name], MD;  Location: [place];  Service: Ophthalmology;  Laterality: Left;  . Ptca    . Thrombectomy / arteriovenous graft revision    . Cardiac catheterization    . Coronary angioplasty  05/28/2014    stent placement to the mid RCA    Current Outpatient Prescriptions  Medication Sig Dispense Refill  . albuterol (PROAIR HFA) 108 (90 BASE) MCG/ACT inhaler Inhale 1 puff into the lungs every 4 (four) hours as needed.       . Alcohol Swabs PADS Patient uses at home to assist with testing blood sugars twice daily.  100 each  12  . aspirin EC 81 MG tablet Take 81 mg by mouth daily.      . B Complex-C-Folic Acid (VOL-CARE RX) 1 MG TABS       . BAYER CONTOUR TEST test strip TEST BLOOD SUGARS 2 TIMES DAILY  100 each  PRN  . Blood Glucose Monitoring Suppl (ONE TOUCH ULTRA SYSTEM KIT) W/DEVICE KIT Please dispense for patient to use at home to check blood sugars.  1 each  0  . calcium acetate (PHOSLO) 667 MG capsule       . carvedilol (COREG) 12.5 MG tablet Take 1 tablet (12.5 mg total) by mouth 2 (two) times daily.  60 tablet  6  . clopidogrel (PLAVIX) 75 MG tablet Take 1 tablet (75 mg total) by mouth daily.  30 tablet  11  . hyoscyamine (LEVSIN SL) 0.125 MG SL tablet       . Insulin Detemir 100 UNIT/ML SOPN Inject 20 Units into the skin daily.  10 pen  5  . Insulin Pen Needle 32G X 6 MM MISC 1 Units by Does not apply route as needed.  100 each  11  . Lancets Thin MISC Using at home to test blood sugars twice daily.  200 each  6  . lidocaine (XYLOCAINE) 4 % external solution Apply topically 3 (three) times daily as needed.  50 mL  5  . LORazepam (ATIVAN) 0.5 MG tablet Take 1 tablet (0.5 mg total) by mouth at bedtime.  30 tablet  2  . mometasone (NASONEX) 50 MCG/ACT nasal spray Place 2 sprays into the nose daily. 2 sprays by  Each Nare route daily as needed.      . nitroGLYCERIN (NITROSTAT) 0.4 MG SL tablet Place 1 tablet (0.4 mg total) under the tongue every  5 (five) minutes as needed for chest pain.  25 tablet  3  . oxyCODONE-acetaminophen (ROXICET) 5-325 MG per tablet Take 1 tablet by mouth every 8 (eight) hours as needed for severe pain.  60 tablet  0  . pregabalin (LYRICA) 75 MG capsule Take 75 mg by mouth daily.      . rosuvastatin (CRESTOR) 40 MG tablet Take 1 tablet (40 mg total) by mouth daily.  30 tablet  6   No current facility-administered medications for this visit.    Allergies as of 11/15/2013 - Review Complete 11/15/2013  Allergen Reaction Noted  . Hydrocodone  08/20/2012  . Metformin  10/26/2013    Family History  Problem Relation Age of Onset  . Anesthesia problems Neg Hx   . Hypotension Neg Hx   . Malignant hyperthermia Neg Hx   . Pseudochol deficiency Neg Hx   . Stroke Mother   . Heart attack Mother   . Heart disease Mother   . Colon cancer Father   . Colon cancer Sister   . Heart attack Brother   . Stroke Brother   . Diabetes Brother   . Breast cancer Sister   . Diabetes Brother   . Diabetes Daughter     History   Social History  . Marital Status: Single    Spouse Name: N/A    Number of Children: 2  . Years of Education: N/A   Occupational History  . [place] History Main Topics  . Smoking status: Former Smoker -- 1.00 packs/day for 25 years    Quit date: 06/09/2010  . Smokeless tobacco: Never Used  . Alcohol Use: No  . Drug Use: No  . Sexual Activity: No   Other Topics Concern  . Not on file   Social History Narrative  . No narrative on file      Physical Exam: BP 138/78  Pulse 72  Ht '5\' 7"'  (1.702 m)  Wt 168 lb (76.204 kg)  BMI 26.31 kg/m2 Constitutional: generally well-appearing Psychiatric: alert and oriented x3 Abdomen: soft, nontender, nondistended, no obvious ascites, no peritoneal signs, normal bowel  sounds     Assessment and plan: 58 y.o. female with intermittent nausea, now with constipation  Previously she was bothered by diarrhea and I put her on Imodium on a daily basis. Seems like now she is somewhat constipated. She is also on narcotic pain medicines for the past 2 months for back pains. I recommended she, off of the end of the and and see how she feels over the next week or so. We will also get records from her recent hospitalization at [facility] including CT scan of her abdomen and pelvis that she tells me was normal. Labs were also drawn but not available today.Marland Kitchen

## 2013-11-15 NOTE — Patient Instructions (Addendum)
We will get CT scan reports (10/2013) and labs from last week from [facility]. Stop taking imodium every day and if in a week you are still bothered by constipation try miralax powder, take on dose once daily for the next week and then adjust up or down as needed.

## 2013-11-16 ENCOUNTER — Telehealth: Payer: Self-pay | Admitting: Gastroenterology

## 2013-11-16 DIAGNOSIS — N186 End stage renal disease: Secondary | ICD-10-CM | POA: Diagnosis not present

## 2013-11-16 NOTE — Telephone Encounter (Signed)
Pt aware.

## 2013-11-16 NOTE — Telephone Encounter (Signed)
CT scan abd and pelvis without IV contrast 09/2013 [place]: done for right sided abdominal pain; essentially normal, no explanation for abd pains   Patty, please call her.  I reviewed CT from [facility] and no clear cause of her symptoms.  Should continue with the suggestions outlined at recent visit.

## 2013-11-17 ENCOUNTER — Encounter: Payer: Self-pay | Admitting: Podiatrist

## 2013-11-17 ENCOUNTER — Ambulatory Visit (INDEPENDENT_AMBULATORY_CARE_PROVIDER_SITE_OTHER): Payer: Medicare Other | Admitting: Podiatrist

## 2013-11-17 VITALS — BP 172/85 | HR 71 | Resp 16 | Ht 67.0 in | Wt 167.0 lb

## 2013-11-17 DIAGNOSIS — B351 Tinea unguium: Secondary | ICD-10-CM

## 2013-11-17 DIAGNOSIS — I509 Heart failure, unspecified: Secondary | ICD-10-CM | POA: Diagnosis not present

## 2013-11-17 DIAGNOSIS — I252 Old myocardial infarction: Secondary | ICD-10-CM | POA: Diagnosis not present

## 2013-11-17 DIAGNOSIS — E1140 Type 2 diabetes mellitus with diabetic neuropathy, unspecified: Secondary | ICD-10-CM

## 2013-11-17 DIAGNOSIS — M79609 Pain in unspecified limb: Secondary | ICD-10-CM

## 2013-11-17 DIAGNOSIS — E1149 Type 2 diabetes mellitus with other diabetic neurological complication: Secondary | ICD-10-CM

## 2013-11-17 DIAGNOSIS — Z5189 Encounter for other specified aftercare: Secondary | ICD-10-CM | POA: Diagnosis not present

## 2013-11-17 NOTE — Progress Notes (Deleted)
   Subjective:    Patient ID: Theresa [name], female    DOB: 10-13-1955, 58 y.o.   MRN: [id number]  HPI Comments: i need my toenails trimmed. They do not hurt. They have been thick for 2 years or more. They are getting worse. i trim my toenails but i cut my toe.     Review of Systems     Objective:   Physical Exam Pedal pulses palpable at 2/4 dp and pt bilateral.  Sensation decreased to swmf 5.07 at 2/5 sites bilateral.  Forefoot cavus deformity is present.  Prominent metatarsal heads present bilateral. No pre ulcerative calluses or lesions noted.  No interdigital maceration is present.  Toenails are thick, discolored, and mycotic x 10.  Scarring of skin present dorsal lateral aspect of bilateral feet.        Assessment & Plan:  Symptomatic Mycotic toenails x 10  Plan:  Debridement of toenails carried out without complication.  She will be seen back as needed or in 3 months.

## 2013-11-17 NOTE — Patient Instructions (Signed)
Topical nail film-- Kerasal or Fungi nail-- at pharmacies -- paint on the big toenails daily  Diabetes and Foot Care Diabetes may cause you to have problems because of poor blood supply (circulation) to your feet and legs. This may cause the skin on your feet to become thinner, break easier, and heal more slowly. Your skin may become dry, and the skin may peel and crack. You may also have nerve damage in your legs and feet causing decreased feeling in them. You may not notice minor injuries to your feet that could lead to infections or more serious problems. Taking care of your feet is one of the most important things you can do for yourself.  HOME CARE INSTRUCTIONS  Wear shoes at all times, even in the house. Do not go barefoot. Bare feet are easily injured.  Check your feet daily for blisters, cuts, and redness. If you cannot see the bottom of your feet, use a mirror or ask someone for help.  Wash your feet with warm water (do not use hot water) and mild soap. Then pat your feet and the areas between your toes until they are completely dry. Do not soak your feet as this can dry your skin.  Apply a moisturizing lotion or petroleum jelly (that does not contain alcohol and is unscented) to the skin on your feet and to dry, brittle toenails. Do not apply lotion between your toes.  Trim your toenails straight across. Do not dig under them or around the cuticle. File the edges of your nails with an emery board or nail file.  Do not cut corns or calluses or try to remove them with medicine.  Wear clean socks or stockings every day. Make sure they are not too tight. Do not wear knee-high stockings since they may decrease blood flow to your legs.  Wear shoes that fit properly and have enough cushioning. To break in new shoes, wear them for just a few hours a day. This prevents you from injuring your feet. Always look in your shoes before you put them on to be sure there are no objects inside.  Do not  cross your legs. This may decrease the blood flow to your feet.  If you find a minor scrape, cut, or break in the skin on your feet, keep it and the skin around it clean and dry. These areas may be cleansed with mild soap and water. Do not cleanse the area with peroxide, alcohol, or iodine.  When you remove an adhesive bandage, be sure not to damage the skin around it.  If you have a wound, look at it several times a day to make sure it is healing.  Do not use heating pads or hot water bottles. They may burn your skin. If you have lost feeling in your feet or legs, you may not know it is happening until it is too late.  Make sure your health care provider performs a complete foot exam at least annually or more often if you have foot problems. Report any cuts, sores, or bruises to your health care provider immediately. SEEK MEDICAL CARE IF:   You have an injury that is not healing.  You have cuts or breaks in the skin.  You have an ingrown nail.  You notice redness on your legs or feet.  You feel burning or tingling in your legs or feet.  You have pain or cramps in your legs and feet.  Your legs or feet are numb.  Your feet always feel cold. SEEK IMMEDIATE MEDICAL CARE IF:   There is increasing redness, swelling, or pain in or around a wound.  There is a red line that goes up your leg.  Pus is coming from a wound.  You develop a fever or as directed by your health care provider.  You notice a bad smell coming from an ulcer or wound. Document Released: 05/23/2000 Document Revised: 01/26/2013 Document Reviewed: 11/02/2012 [facility] Patient Information 2014 [place].

## 2013-11-21 NOTE — Progress Notes (Signed)
Patient presents today for elongated toenails.  She states they are long and are bothersome and uncomfortable.  Objective:   Pedal pulses palpable at 2/4 dp and pt bilateral.  Sensation decreased to swmf 5.07 at 2/5 sites bilateral.  Forefoot cavus deformity is present.  Prominent metatarsal heads present bilateral. No pre ulcerative calluses or lesions noted.  No interdigital maceration is present.  Toenails are thick, discolored, and mycotic x 10.  Scarring of skin present dorsal lateral aspect of bilateral feet.        Assessment & Plan:  Symptomatic Mycotic toenails x 10  Plan:  Debridement of toenails carried out without complication.  She will be seen back as needed or in 3 months.

## 2013-11-23 DIAGNOSIS — I509 Heart failure, unspecified: Secondary | ICD-10-CM | POA: Diagnosis not present

## 2013-11-23 DIAGNOSIS — Z5189 Encounter for other specified aftercare: Secondary | ICD-10-CM | POA: Diagnosis not present

## 2013-11-23 DIAGNOSIS — I252 Old myocardial infarction: Secondary | ICD-10-CM | POA: Diagnosis not present

## 2013-11-24 DIAGNOSIS — N186 End stage renal disease: Secondary | ICD-10-CM | POA: Diagnosis not present

## 2013-11-24 DIAGNOSIS — I252 Old myocardial infarction: Secondary | ICD-10-CM | POA: Diagnosis not present

## 2013-11-24 DIAGNOSIS — I10 Essential (primary) hypertension: Secondary | ICD-10-CM | POA: Diagnosis not present

## 2013-11-24 DIAGNOSIS — T82898A Other specified complication of vascular prosthetic devices, implants and grafts, initial encounter: Secondary | ICD-10-CM | POA: Diagnosis not present

## 2013-11-24 DIAGNOSIS — J449 Chronic obstructive pulmonary disease, unspecified: Secondary | ICD-10-CM | POA: Diagnosis not present

## 2013-11-24 DIAGNOSIS — I509 Heart failure, unspecified: Secondary | ICD-10-CM | POA: Diagnosis not present

## 2013-11-24 DIAGNOSIS — Z5189 Encounter for other specified aftercare: Secondary | ICD-10-CM | POA: Diagnosis not present

## 2013-11-25 ENCOUNTER — Telehealth: Payer: Self-pay | Admitting: Internal Medicine

## 2013-11-25 ENCOUNTER — Ambulatory Visit: Payer: Medicare Other | Admitting: Internal Medicine

## 2013-11-25 NOTE — Telephone Encounter (Signed)
Pt stated that she will not be able to come to appt today because she is sick form dialysis treatment today.msn

## 2013-12-01 ENCOUNTER — Ambulatory Visit (INDEPENDENT_AMBULATORY_CARE_PROVIDER_SITE_OTHER): Payer: Medicare Other | Admitting: Family Medicine

## 2013-12-01 ENCOUNTER — Encounter: Payer: Self-pay | Admitting: Family Medicine

## 2013-12-01 VITALS — BP 160/66 | HR 71 | Temp 98.3°F | Ht 67.5 in | Wt 165.0 lb

## 2013-12-01 DIAGNOSIS — IMO0002 Reserved for concepts with insufficient information to code with codable children: Secondary | ICD-10-CM | POA: Diagnosis not present

## 2013-12-01 DIAGNOSIS — I2510 Atherosclerotic heart disease of native coronary artery without angina pectoris: Secondary | ICD-10-CM | POA: Diagnosis not present

## 2013-12-01 DIAGNOSIS — M25551 Pain in right hip: Secondary | ICD-10-CM

## 2013-12-01 DIAGNOSIS — M25559 Pain in unspecified hip: Secondary | ICD-10-CM | POA: Diagnosis not present

## 2013-12-01 DIAGNOSIS — M5416 Radiculopathy, lumbar region: Secondary | ICD-10-CM

## 2013-12-01 MED ORDER — LORAZEPAM 0.5 MG PO TABS: 0.5000 mg | ORAL_TABLET | Freq: Every day | ORAL | Status: DC

## 2013-12-01 MED ORDER — OXYCODONE-ACETAMINOPHEN 5-325 MG PO TABS: 1.0000 | ORAL_TABLET | Freq: Three times a day (TID) | ORAL | Status: DC | PRN

## 2013-12-01 NOTE — Progress Notes (Signed)
Pre visit review using our clinic review tool, if applicable. No additional management support is needed unless otherwise documented below in the visit note. 

## 2013-12-01 NOTE — Progress Notes (Signed)
Date:  12/01/2013   Name:  Theresa [name]   DOB:  07/06/1955   MRN:  [id number]  Primary Physician: Rica Mast, MD  Pleasant patient with multiple complex medical problems s/p MI with PCI in 05/2013 with h/o ESRD with ongoing back pain and radiculopathy s/p adequate PT, max dosing of Lyrica, where she will need to be on Plavix until 05/2013.  Back is hurting and getting somewhat numb. - This is intermittent.   TENS unit? [facility] - check on status of TENS unit.  She was unable to get this per report.   Her insurance denied Lidocaine patches in her case - extensive attempt to get approved. Numbing cream x 30 min to 1 hour.   Now she is taking 1/2 tab of percocet in the PM.   1/4 tablet of pain medication in the AM 1/2 tablet in the PM   09/22/2013 Last OV with Owens Loffler, MD  Pleasant patient on HD for ESRD and with recent PCI for MI in 05/2013 on Plavix with ongoing back pain, radiculopathy. Intervally, ativan did minimal to help symptoms, and her insurance did not approve Lidocaine patches. She is intermittently taking 1/2 tab of Percocet 5 mg, which makes her fall asleep. She is still having mostly r buttocks pain and LBP.  Prior work-up and conservative management has been thorough per below. With higher doses of Lyrica she has had some twitching.   TENS unit Spinal cord stimulator - no Percocet 1/4 tab bid  Plavix, pci 05/2013  08/22/2013 Last OV with Owens Loffler, MD  Thank you for having me see Theresa [name] in consultation today at [facility] at [facility] for her problem with hip and back pain.  As you may recall, she is a 58 y.o. year old female on hemodialysis for ESRD and on Plavix s/p PCI for NSTEMI recently and IDDM with a history of R sided "hip" pain, back pain, and lumbar radiculopathy for at least 1 year.    Sciatic nerve and goes all the way down hip. Radiating pain down both legs - front and back on the entire leg and to the  foot. Has been going on for a year. Went to PT. Also tried some percocet.  Lyrica 100 mg once day, with a history of diabetic neuropathy, and on HD.  HD, 3 x, Tu, Thurs, Sat.   Back and hips hurt all the time.  Walking humped over much of the time.  She describes her hip pain as posterior and does not have anterior groin pain.  R hip x-ray normal, 07/02/2011. These are not available for my review.   [place] 2 years ago, broken shoulder, but she denies a history of trauma or surgery to the hip or back.  Denies bowel or bladder incontinence. No saddle anesthesia.    Past Medical History  Diagnosis Date  . Hypertension   . ESRD on hemodialysis     a. [facility] in [place], [place], on a TTS schedule.  She started dialysis in Feb 2014.  Etiology of renal failure not known, likely diabetes.  Has a left upper arm AV graft.  Marland Kitchen COPD (chronic obstructive pulmonary disease)   . Emphysema   . History of pneumonia     June 2012  . History of bronchitis     Mar 2012  . Peripheral vascular disease   . Chronic constipation   . Colon polyps   . Sickle cell trait   . Anemia of  chronic disease   . Diabetes mellitus   . Moderate mitral insufficiency     a. 10/2013 Echo: EF 55-60%, mod MR.  . Diabetic neuropathy   . Diabetic retinopathy 05/28/2013    Hx bilat retinal detachment, proliferative diab retinopathy and bilat vitreous hemorrhage   . Hyperlipidemia   . Coronary artery disease     a. 05/2013 NSTEMI/PCI: LM 20d, LAD min irregs, LCX small, nl, OM1 nl, RCA dom 59m(2.5x16 Promus DES), PDA1 80p.  . Chronic diastolic CHF (congestive heart failure)     a. 10/2013 Echo ([facility]: EF 55-60%, mod conc LVH, mod MR, mildly dil LA, mild Ao sclerosis w/o stenosis.  . Carotid arterial disease     a. 02/2013 U/S: 40-59% bilat ICA stenosis - *f/u 02/2014*  . History of tobacco abuse     a. Quit 2012.    Past Surgical History  Procedure Laterality Date  . Abdominal hysterectomy      2000  . Dilation and  curettage of uterus      several in the early 80's  . Eye surgery      bilateral laser 2012  . Esophagogastroduodenoscopy      2012  . Colonscopy    . Tubal ligation      1979  . Pars plana vitrectomy  04/22/2011    Procedure: PARS PLANA VITRECTOMY WITH 25 GAUGE;  Surgeon: JHayden Pedro MD;  [facility] [facility]  Service: Ophthalmology;  Laterality: Left;  membrane peel, endolaser, gas fluid exchange, silicone oil, repair of complex traction retinal detachment  . Pars plana vitrectomy  09/30/2011    Procedure: PARS PLANA VITRECTOMY WITH 25 GAUGE;  Surgeon: JHayden Pedro MD;  [place] [facility]  Service: Ophthalmology;  Laterality: Right;  Endolaser; Repair of Complex Traction Retinal Detachment  . Gas/fluid exchange  09/30/2011    Procedure: GAS/FLUID EXCHANGE;  Surgeon: JHayden Pedro MD;  [place] [facility]  Service: Ophthalmology;  Laterality: Right;  . Gas insertion  09/30/2011    Procedure: INSERTION OF GAS;  Surgeon: JHayden Pedro MD;  [place] [facility]  Service: Ophthalmology;  Laterality: Right;  [id number]  . Eye surgery      right  . Pars plana vitrectomy  02/24/2012    Procedure: PARS PLANA VITRECTOMY WITH 25 GAUGE;  Surgeon: JHayden Pedro MD;  [place] [facility]  Service: Ophthalmology;  Laterality: Left;  . Silicon oil removal  95/36/1443   Procedure: SILICON OIL REMOVAL;  Surgeon: JHayden Pedro MD;  [place] [facility]  Service: Ophthalmology;  Laterality: Left;  . Ptca    . Thrombectomy / arteriovenous graft revision    . Cardiac catheterization    . Coronary angioplasty  05/28/2014    stent placement to the mid RCA    History   Social History  . Marital Status: Single    Spouse Name: N/A    Number of Children: 2  . Years of Education: N/A   Occupational History  . [employer]  . Smoking status: Former Smoker -- 1.00 packs/day for 25 years    Quit date: 06/09/2010  . Smokeless tobacco: Never Used  . Alcohol Use: No  . Drug  Use: No  . Sexual Activity: No   Other Topics Concern  . None   Social History Narrative  . None    Family History  Problem Relation Age of Onset  . Anesthesia problems Neg Hx   .  Hypotension Neg Hx   . Malignant hyperthermia Neg Hx   . Pseudochol deficiency Neg Hx   . Stroke Mother   . Heart attack Mother   . Heart disease Mother   . Colon cancer Father   . Colon cancer Sister   . Heart attack Brother   . Stroke Brother   . Diabetes Brother   . Breast cancer Sister   . Diabetes Brother   . Diabetes Daughter     Medications and Allergies reviewed  Review of Systems:    GEN: No fevers, chills. Nontoxic. Primarily MSK c/o today. MSK: Detailed in the HPI GI: tolerating PO intake without difficulty Neuro: as above Otherwise, the pertinent positives and negatives are listed above and in the HPI, otherwise a full review of systems has been reviewed and is negative unless noted positive.   Physical Examination: BP 160/66  Pulse 71  Temp(Src) 98.3 F (36.8 C) (Oral)  Ht 5' 7.5" (1.715 m)  Wt 165 lb (74.844 kg)  BMI 25.45 kg/m2    GEN: Well-developed,well-nourished,in no acute distress; alert,appropriate and cooperative throughout examination HEENT: Normocephalic and atraumatic without obvious abnormalities. Ears, externally no deformities PULM: Breathing comfortably in no respiratory distress EXT: No clubbing, cyanosis, or edema PSYCH: Normally interactive. Cooperative during the interview. Pleasant. Friendly and conversant. Not anxious or depressed appearing. Normal, full affect.  Range of motion at  the waist: Flexion, extension, lateral bending and rotation: relatively preserved with some terminal pain  No echymosis or edema Rises to examination table with mild difficulty Gait: minimally antalgic  Inspection/Deformity: N Paraspinus Tenderness: l2-s1 right and left  B Ankle Dorsiflexion (L5,4): 5/5 B Great Toe Dorsiflexion (L5,4): 5/5 Heel Walk (L5):  WNL Toe Walk (S1): WNL Rise/Squat (L4): WNL, mild pain  SENSORY B Medial Foot (L4): WNL B Dorsum (L5): WNL B Lateral (S1): WNL Light Touch: WNL Pinprick: WNL  REFLEXES Knee (L4): 2+ Ankle (S1): 2+  B SLR, seated: neg B SLR, supine: pain in back only B FABER: neg B Reverse FABER: neg B Greater Troch: NT B Log Roll: neg B Stork: NT B Sciatic Notch: NT   HIP EXAM: SIDE: b ROM: Abduction, Flexion, Internal and External range of motion: approx 10 deg loss of IROM on R and L Pain with terminal IROM and EROM: posterior only GTB: NT SLR: NEG Knees: No effusion FABER: NT REVERSE FABER: NT, neg Piriformis: TTP Str: flexion: 5/5 abduction: 5/5 adduction: 5/5 Strength testing non-tender  Objective Data: Dg Lumbar Spine Complete  08/22/2013   CLINICAL DATA:  Lumbar radiculopathy  EXAM: LUMBAR SPINE - COMPLETE 4+ VIEW  COMPARISON:  None.  FINDINGS: Normal alignment. Mild narrowing of the L5-S1 interspace with adjacent discogenic sclerosis in the vertebral bodies and anterior endplate spurring. Negative for fracture. Patchy aortic calcifications without suggestion of aneurysm.  IMPRESSION: 1. Negative for fracture or other acute bone abnormality. 2. Degenerative disc disease L5-S1.   Electronically Signed   By: Arne Cleveland M.D.   On: 08/22/2013 10:41   Dg Hip Complete Right  08/22/2013   CLINICAL DATA:  Right lower extremity radicular symptoms  EXAM: RIGHT HIP - COMPLETE 2+ VIEW  COMPARISON:  DG HIP COMPLETE*R* dated 06/30/2011  FINDINGS: The bony pelvis is adequately mineralized. There is no lytic or blastic lesion. The hip joint spaces are mildly narrowed bilaterally. This has slightly progressed since the previous study. The femoral heads and remain smoothly rounded. The observed portions of the sacrum are grossly normal.  AP and frogleg lateral  views of the right hip reveal no acute bony abnormality. Again demonstrated is narrowing of the hip joint compartment.  IMPRESSION: There  is degenerative narrowing of the right hip joint. There is no evidence of an acute fracture or dislocation.   Electronically Signed   By: David  Martinique   On: 08/22/2013 11:31    Assessment and Plan: Lumbar radiculopathy Osteoarthritis of right hip, seems to be minimally involved. DDD (degenerative disc disease), lumbosacral ESRD on hemodialysis NSTEMI (non-ST elevated myocardial infarction)  I suspect primary driver is DDD at K0-X3 with probable nerve encroachment at foramen bilaterally Exam unchanged.  Recommendations: 1. ESRD on HD and s/p recent PCI for NSTEMI presents significant challenges. 2. Lidocaine patches not approved by insurance. Trial of Lidocaine solution TID. Helpful, but short-lived. 3. Ativan 0.5 mg at night for muscle relaxer. Most others cannot be used in ESRD, but ativan is eliminated almost completely by the liver. Has been of minimal benefit. 4. Continue HEP 5. Lyrica 100 mg daily is an excellent idea and can help radiculopathy, but I am hesitant to increase dose in HD patient. 6. Heat and massage prn. 7. Interventions such as ESI, spinal cord stimulator, or any other limited on plavix and PCI in 05/2013. 8. For now, increase percocet to bid dosing, very low dose. Decrease dose to 1/4 tablet of percocet 5 mg to bid dosing.  9. Given prescription for TENS unit. Unsure why this not filled. Butch Penny will check with [facility] medical. Standard.  10. TCA's, Cymbalta, Savella would be contraindicated in this case.   Patient Instructions  1/4 Percocet tablet twice a day If that makes you too drowsy, then decrease to once a day  New Prescriptions   No medications on file   We will see the patient back in 06/2013.  Thank you for having [place] see Theresa [name] in consultation.  Feel free to contact me with any questions.  Updated Medication List: Patient's Medications  New Prescriptions   No medications on file  Previous Medications   ALBUTEROL (PROAIR HFA) 108 (90 BASE)  MCG/ACT INHALER    Inhale 1 puff into the lungs every 4 (four) hours as needed.    ALCOHOL SWABS PADS    Patient uses at home to assist with testing blood sugars twice daily.   ASPIRIN EC 81 MG TABLET    Take 81 mg by mouth daily.   B COMPLEX-C-FOLIC ACID (VOL-CARE RX) 1 MG TABS       BAYER CONTOUR TEST TEST STRIP    TEST BLOOD SUGARS 2 TIMES DAILY   BLOOD GLUCOSE MONITORING SUPPL (ONE TOUCH ULTRA SYSTEM KIT) W/DEVICE KIT    Please dispense for patient to use at home to check blood sugars.   CALCIUM ACETATE (PHOSLO) 667 MG CAPSULE       CARVEDILOL (COREG) 12.5 MG TABLET    Take 1 tablet (12.5 mg total) by mouth 2 (two) times daily.   CLOPIDOGREL (PLAVIX) 75 MG TABLET    Take 1 tablet (75 mg total) by mouth daily.   HYOSCYAMINE (LEVSIN SL) 0.125 MG SL TABLET       INSULIN DETEMIR 100 UNIT/ML SOPN    Inject 20 Units into the skin daily.   INSULIN PEN NEEDLE 32G X 6 MM MISC    1 Units by Does not apply route as needed.   LANCETS THIN MISC    Using at home to test blood sugars twice daily.   LIDOCAINE (XYLOCAINE) 4 % EXTERNAL SOLUTION  Apply topically 3 (three) times daily as needed.   MOMETASONE (NASONEX) 50 MCG/ACT NASAL SPRAY    Place 2 sprays into the nose daily. 2 sprays by Each Nare route daily as needed.   NITROGLYCERIN (NITROSTAT) 0.4 MG SL TABLET    Place 1 tablet (0.4 mg total) under the tongue every 5 (five) minutes as needed for chest pain.   PREGABALIN (LYRICA) 75 MG CAPSULE    Take 75 mg by mouth daily.   ROSUVASTATIN (CRESTOR) 40 MG TABLET    Take 1 tablet (40 mg total) by mouth daily.  Modified Medications   Modified Medication Previous Medication   LORAZEPAM (ATIVAN) 0.5 MG TABLET LORazepam (ATIVAN) 0.5 MG tablet      Take 1 tablet (0.5 mg total) by mouth at bedtime.    Take 1 tablet (0.5 mg total) by mouth at bedtime.   OXYCODONE-ACETAMINOPHEN (ROXICET) 5-325 MG PER TABLET oxyCODONE-acetaminophen (ROXICET) 5-325 MG per tablet      Take 1 tablet by mouth every 8 (eight) hours as  needed for severe pain.    Take 1 tablet by mouth every 8 (eight) hours as needed for severe pain.  Discontinued Medications   No medications on file     Signed,  Maud Deed. Copland, MD, [place] at [facility] [address] [place], [place] [address] Phone: [phone] Fax: [phone]

## 2013-12-05 ENCOUNTER — Ambulatory Visit: Payer: BC Managed Care – PPO | Admitting: Internal Medicine

## 2013-12-06 ENCOUNTER — Ambulatory Visit: Payer: Self-pay | Admitting: Vascular Surgery

## 2013-12-06 DIAGNOSIS — J449 Chronic obstructive pulmonary disease, unspecified: Secondary | ICD-10-CM | POA: Diagnosis not present

## 2013-12-06 DIAGNOSIS — I871 Compression of vein: Secondary | ICD-10-CM | POA: Diagnosis not present

## 2013-12-06 DIAGNOSIS — T82898A Other specified complication of vascular prosthetic devices, implants and grafts, initial encounter: Secondary | ICD-10-CM | POA: Diagnosis not present

## 2013-12-06 DIAGNOSIS — N186 End stage renal disease: Secondary | ICD-10-CM | POA: Diagnosis not present

## 2013-12-06 DIAGNOSIS — Z992 Dependence on renal dialysis: Secondary | ICD-10-CM | POA: Diagnosis not present

## 2013-12-06 DIAGNOSIS — Z90710 Acquired absence of both cervix and uterus: Secondary | ICD-10-CM | POA: Diagnosis not present

## 2013-12-06 DIAGNOSIS — Z79899 Other long term (current) drug therapy: Secondary | ICD-10-CM | POA: Diagnosis not present

## 2013-12-06 DIAGNOSIS — I509 Heart failure, unspecified: Secondary | ICD-10-CM | POA: Diagnosis not present

## 2013-12-06 DIAGNOSIS — I120 Hypertensive chronic kidney disease with stage 5 chronic kidney disease or end stage renal disease: Secondary | ICD-10-CM | POA: Diagnosis not present

## 2013-12-06 NOTE — Telephone Encounter (Signed)
Called and spoke with Theresa [name].  She states she just got out of the hospital from having surgery on her arm so she has not gone to [facility] yet to get the Tens Unit.

## 2013-12-06 NOTE — Telephone Encounter (Signed)
This message was in regards to her appointment yesterday with Dr. Gilford Rile.  Not sure why you got it.

## 2013-12-06 NOTE — Telephone Encounter (Signed)
Please check about TENS unit as below: from my office visit with her.  "She is supposed to be getting a TENS unit from [facility]. To my knowledge, I never got any paperwork. Can you check with them about this? I previously gave her a Rx for it.  Thanks.  Electronically Signed By: Owens Loffler, MD On: 12/06/2013 10:52 AM"

## 2013-12-06 NOTE — Telephone Encounter (Signed)
She is supposed to be getting a TENS unit from [facility]. To my knowledge, I never got any paperwork. Can you check with them about this? I previously gave her a Rx for it.  Thanks. Electronically Signed  By: Owens Loffler, MD On: 12/06/2013 10:52 AM

## 2013-12-07 ENCOUNTER — Encounter: Payer: Self-pay | Admitting: Cardiovascular Disease

## 2013-12-07 DIAGNOSIS — D631 Anemia in chronic kidney disease: Secondary | ICD-10-CM | POA: Diagnosis not present

## 2013-12-07 DIAGNOSIS — N2581 Secondary hyperparathyroidism of renal origin: Secondary | ICD-10-CM | POA: Diagnosis not present

## 2013-12-07 DIAGNOSIS — N039 Chronic nephritic syndrome with unspecified morphologic changes: Secondary | ICD-10-CM | POA: Diagnosis not present

## 2013-12-07 DIAGNOSIS — N186 End stage renal disease: Secondary | ICD-10-CM | POA: Diagnosis not present

## 2013-12-07 DIAGNOSIS — D509 Iron deficiency anemia, unspecified: Secondary | ICD-10-CM | POA: Diagnosis not present

## 2013-12-21 DIAGNOSIS — E1129 Type 2 diabetes mellitus with other diabetic kidney complication: Secondary | ICD-10-CM | POA: Diagnosis not present

## 2013-12-21 DIAGNOSIS — N186 End stage renal disease: Secondary | ICD-10-CM | POA: Diagnosis not present

## 2013-12-22 ENCOUNTER — Ambulatory Visit: Payer: BC Managed Care – PPO | Admitting: Internal Medicine

## 2013-12-22 ENCOUNTER — Telehealth: Payer: Self-pay | Admitting: Internal Medicine

## 2013-12-22 NOTE — Telephone Encounter (Signed)
Pt called at 1345 to cancel appt today.

## 2014-01-05 DIAGNOSIS — I10 Essential (primary) hypertension: Secondary | ICD-10-CM | POA: Diagnosis not present

## 2014-01-05 DIAGNOSIS — N186 End stage renal disease: Secondary | ICD-10-CM | POA: Diagnosis not present

## 2014-01-05 DIAGNOSIS — T82898A Other specified complication of vascular prosthetic devices, implants and grafts, initial encounter: Secondary | ICD-10-CM | POA: Diagnosis not present

## 2014-01-05 DIAGNOSIS — M79609 Pain in unspecified limb: Secondary | ICD-10-CM | POA: Diagnosis not present

## 2014-01-06 DIAGNOSIS — N186 End stage renal disease: Secondary | ICD-10-CM | POA: Diagnosis not present

## 2014-01-07 ENCOUNTER — Encounter: Payer: Self-pay | Admitting: Cardiovascular Disease

## 2014-01-07 DIAGNOSIS — Z992 Dependence on renal dialysis: Secondary | ICD-10-CM | POA: Diagnosis not present

## 2014-01-07 DIAGNOSIS — N186 End stage renal disease: Secondary | ICD-10-CM | POA: Diagnosis not present

## 2014-01-09 DIAGNOSIS — N186 End stage renal disease: Secondary | ICD-10-CM | POA: Diagnosis not present

## 2014-01-09 DIAGNOSIS — D631 Anemia in chronic kidney disease: Secondary | ICD-10-CM | POA: Diagnosis not present

## 2014-01-09 DIAGNOSIS — N039 Chronic nephritic syndrome with unspecified morphologic changes: Secondary | ICD-10-CM | POA: Diagnosis not present

## 2014-01-09 DIAGNOSIS — N2581 Secondary hyperparathyroidism of renal origin: Secondary | ICD-10-CM | POA: Diagnosis not present

## 2014-01-09 DIAGNOSIS — D509 Iron deficiency anemia, unspecified: Secondary | ICD-10-CM | POA: Diagnosis not present

## 2014-01-13 ENCOUNTER — Ambulatory Visit (INDEPENDENT_AMBULATORY_CARE_PROVIDER_SITE_OTHER): Payer: BC Managed Care – PPO | Admitting: Ophthalmology

## 2014-01-16 DIAGNOSIS — N186 End stage renal disease: Secondary | ICD-10-CM | POA: Diagnosis not present

## 2014-01-26 ENCOUNTER — Ambulatory Visit (INDEPENDENT_AMBULATORY_CARE_PROVIDER_SITE_OTHER): Payer: Medicare Other | Admitting: Cardiovascular Disease

## 2014-01-26 ENCOUNTER — Encounter: Payer: Self-pay | Admitting: Cardiovascular Disease

## 2014-01-26 VITALS — BP 162/64 | HR 75 | Ht 67.5 in | Wt 165.5 lb

## 2014-01-26 DIAGNOSIS — I2510 Atherosclerotic heart disease of native coronary artery without angina pectoris: Secondary | ICD-10-CM | POA: Diagnosis not present

## 2014-01-26 DIAGNOSIS — D631 Anemia in chronic kidney disease: Secondary | ICD-10-CM

## 2014-01-26 DIAGNOSIS — E118 Type 2 diabetes mellitus with unspecified complications: Secondary | ICD-10-CM | POA: Diagnosis not present

## 2014-01-26 DIAGNOSIS — E785 Hyperlipidemia, unspecified: Secondary | ICD-10-CM

## 2014-01-26 DIAGNOSIS — N189 Chronic kidney disease, unspecified: Secondary | ICD-10-CM

## 2014-01-26 DIAGNOSIS — R000 Tachycardia, unspecified: Secondary | ICD-10-CM | POA: Diagnosis not present

## 2014-01-26 DIAGNOSIS — I10 Essential (primary) hypertension: Secondary | ICD-10-CM | POA: Diagnosis not present

## 2014-01-26 DIAGNOSIS — N039 Chronic nephritic syndrome with unspecified morphologic changes: Secondary | ICD-10-CM

## 2014-01-26 MED ORDER — ROSUVASTATIN CALCIUM 40 MG PO TABS: 40.0000 mg | ORAL_TABLET | Freq: Every day | ORAL | Status: DC

## 2014-01-26 NOTE — Patient Instructions (Signed)
You are doing well. No medication changes were made.  For tachycardia, take an extra 1/2 coreg as needed  Please call us if you have new issues that need to be addressed before your next appt.  Your physician wants you to follow-up in: 6 months.  You will receive a reminder letter in the mail two months in advance. If you don't receive a letter, please call our office to schedule the follow-up appointment.

## 2014-01-26 NOTE — Assessment & Plan Note (Signed)
Blood pressure elevated today but she does report low blood pressures at dialysis. No medication changes made.

## 2014-01-26 NOTE — Progress Notes (Signed)
 Patient ID: Theresa [name], female    DOB: 03/26/1956, 58 y.o.   MRN: [id number]  HPI Comments: [name] is a pleasant 58 -year old woman with 25 years of smoking, diabetes, chronic renal insufficiency,  previously noted to have moderate to severe mitral valve regurgitation on prior echocardiogram (improved to mild MR on recent echo), pneumonia in June of 2012 with admission overnight to [facility] who initially presented by referral from Dr. Singh (renal) for evaluation of her ejection fraction which is 45% and mitral valve disease.  on dialysis since February 2014, 3 days per week. She has 40-59% bilateral carotid arterial disease as of September 2014  Drug-eluting stent placed December 2014 to the mid RCA  In followup today she reports that she is doing well. Sometimes has low blood pressure during dialysis. Once every 2 months or so she wakes up at nighttime with tachycardia. He sits up and symptoms typically resolve. She does report having snoring but has never had a sleep study. Denies having any significant chest pain with exertion.  She's been told that she might have internal bleeding and is going to see the GI doctor this week.   hospital admission for pneumonia 05/28/2013, elevated cardiac enzymes, transferred to [place] for cardiac catheterization. She had stent placed for 95% mid RCA lesion. She had 2.5 x 16 mm Promus stent placed, post dilated up to 2.78 mm . Since then she reports she is doing well with no further chest pain or shortness of breath . She is on the renal transplant list but reports this will require her to be on the list for 5 years before transplant eligibility   Readmission to the hospital 06/14/2013 for C. difficile colitis. Discharge on 06/16/2013 C. difficile was indigent positive, toxin negative. She was treated with Flagyl for 10 days  Interestingly Stress test at [facility] August 2014 was essentially normal with normal ejection fraction (there was mention of very  small mild region of artifact) Echocardiogram at [facility] July 2014 showed ejection fraction greater than 55%, mild MR  She sees Dr. Singh  of [facility] .  previous  AV graft on 02/23/2013. Graft was thrombosed. Status post revision   Echocardiogram on November 10, 2010 shows mildly dilated left ventricle, ejection fraction 45-50%, mild MS, moderate to severe mitral regurg, mild to moderate tricuspid regurg.   EKG shows normal sinus rhythm with rate 75 beats per minute with no significant ST or T wave changes    Outpatient Encounter Prescriptions as of 01/26/2014  Medication Sig  . albuterol (PROAIR HFA) 108 (90 BASE) MCG/ACT inhaler Inhale 1 puff into the lungs every 4 (four) hours as needed.   . Alcohol Swabs PADS Patient uses at home to assist with testing blood sugars twice daily.  . aspirin EC 81 MG tablet Take 81 mg by mouth daily.  . B Complex-C-Folic Acid (VOL-CARE RX) 1 MG TABS   . BAYER CONTOUR TEST test strip TEST BLOOD SUGARS 2 TIMES DAILY  . Blood Glucose Monitoring Suppl (ONE TOUCH ULTRA SYSTEM KIT) W/DEVICE KIT Please dispense for patient to use at home to check blood sugars.  . calcium acetate (PHOSLO) 667 MG capsule   . carvedilol (COREG) 12.5 MG tablet Take 1 tablet (12.5 mg total) by mouth 2 (two) times daily.  . clopidogrel (PLAVIX) 75 MG tablet Take 1 tablet (75 mg total) by mouth daily.  . hyoscyamine (LEVSIN SL) 0.125 MG SL tablet Take 0.125 mg by mouth daily.   .   Insulin Detemir (LEVEMIR) 100 UNIT/ML Pen Inject 15 Units into the skin daily.  . Insulin Pen Needle 32G X 6 MM MISC 1 Units by Does not apply route as needed.  . Lancets Thin MISC Using at home to test blood sugars twice daily.  . lidocaine (XYLOCAINE) 4 % external solution Apply topically 3 (three) times daily as needed.  . LORazepam (ATIVAN) 0.5 MG tablet Take 1 tablet (0.5 mg total) by mouth at bedtime.  . mometasone (NASONEX) 50 MCG/ACT nasal spray Place 2 sprays into the nose daily. 2 sprays by Each Nare  route daily as needed.  . nitroGLYCERIN (NITROSTAT) 0.4 MG SL tablet Place 1 tablet (0.4 mg total) under the tongue every 5 (five) minutes as needed for chest pain.  . oxyCODONE-acetaminophen (ROXICET) 5-325 MG per tablet Take 1 tablet by mouth every 8 (eight) hours as needed for severe pain.  . pregabalin (LYRICA) 75 MG capsule Take 75 mg by mouth daily.  . rosuvastatin (CRESTOR) 40 MG tablet Take 1 tablet (40 mg total) by mouth daily.    Review of Systems  Constitutional: Negative.   HENT: Negative.   Eyes: Negative.   Respiratory: Negative.   Cardiovascular: Negative.   Gastrointestinal: Negative.   Endocrine: Negative.   Musculoskeletal: Negative.   Skin: Negative.   Allergic/Immunologic: Negative.   Neurological: Negative.   Hematological: Negative.   Psychiatric/Behavioral: Negative.   All other systems reviewed and are negative.  BP 162/64  Pulse 75  Ht 5' 7.5" (1.715 m)  Wt 165 lb 8 oz (75.07 kg)  BMI 25.52 kg/m2  Physical Exam  Nursing note and vitals reviewed. Constitutional: She is oriented to person, place, and time. She appears well-developed and well-nourished.  AV graft on her left arm  HENT:  Head: Normocephalic.  Nose: Nose normal.  Mouth/Throat: Oropharynx is clear and moist.  Eyes: Conjunctivae are normal. Pupils are equal, round, and reactive to light.  Neck: Normal range of motion. Neck supple. No JVD present. Carotid bruit is present.  Cardiovascular: Normal rate, regular rhythm, S1 normal, S2 normal and intact distal pulses.  Exam reveals no gallop and no friction rub.   Murmur heard.  Crescendo systolic murmur is present with a grade of 2/6  Pulmonary/Chest: Effort normal and breath sounds normal. No respiratory distress. She has no wheezes. She has no rales. She exhibits no tenderness.  Abdominal: Soft. Bowel sounds are normal. She exhibits no distension. There is no tenderness.  Musculoskeletal: Normal range of motion. She exhibits no edema and no  tenderness.  Lymphadenopathy:    She has no cervical adenopathy.  Neurological: She is alert and oriented to person, place, and time. Coordination normal.  Skin: Skin is warm and dry. No rash noted. No erythema.  Psychiatric: She has a normal mood and affect. Her behavior is normal. Judgment and thought content normal.    Assessment and Plan        

## 2014-01-26 NOTE — Assessment & Plan Note (Signed)
Currently with no symptoms of angina. No further workup at this time. Continue current medication regimen. 

## 2014-01-26 NOTE — Assessment & Plan Note (Signed)
We have encouraged continued exercise, careful diet management in an effort to lose weight. 

## 2014-01-26 NOTE — Assessment & Plan Note (Signed)
Rare episodes of tachycardia waking her from sleep. Possibly from poor sleep hygiene or obstructive sleep apnea. Suggested she take extra half doses of Coreg as needed if tachycardia persists

## 2014-01-26 NOTE — Assessment & Plan Note (Signed)
She reports having anemia and is scheduled to see the GI physician. Suggested that she not hold her Plavix until December 2015 if procedures are needed

## 2014-01-26 NOTE — Assessment & Plan Note (Signed)
Cholesterol is at goal on the current lipid regimen. No changes to the medications were made.  

## 2014-02-02 ENCOUNTER — Other Ambulatory Visit: Payer: Self-pay | Admitting: *Deleted

## 2014-02-02 ENCOUNTER — Ambulatory Visit (INDEPENDENT_AMBULATORY_CARE_PROVIDER_SITE_OTHER): Payer: Medicare Other | Admitting: Internal Medicine

## 2014-02-02 ENCOUNTER — Encounter: Payer: Self-pay | Admitting: Internal Medicine

## 2014-02-02 VITALS — BP 166/58 | HR 72 | Temp 98.4°F | Resp 14 | Ht 67.5 in | Wt 166.5 lb

## 2014-02-02 DIAGNOSIS — D649 Anemia, unspecified: Secondary | ICD-10-CM

## 2014-02-02 DIAGNOSIS — I2510 Atherosclerotic heart disease of native coronary artery without angina pectoris: Secondary | ICD-10-CM

## 2014-02-02 DIAGNOSIS — J029 Acute pharyngitis, unspecified: Secondary | ICD-10-CM

## 2014-02-02 DIAGNOSIS — J069 Acute upper respiratory infection, unspecified: Secondary | ICD-10-CM | POA: Insufficient documentation

## 2014-02-02 DIAGNOSIS — L600 Ingrowing nail: Secondary | ICD-10-CM | POA: Insufficient documentation

## 2014-02-02 DIAGNOSIS — N186 End stage renal disease: Secondary | ICD-10-CM | POA: Diagnosis not present

## 2014-02-02 DIAGNOSIS — I10 Essential (primary) hypertension: Secondary | ICD-10-CM | POA: Diagnosis not present

## 2014-02-02 DIAGNOSIS — E118 Type 2 diabetes mellitus with unspecified complications: Secondary | ICD-10-CM | POA: Diagnosis not present

## 2014-02-02 DIAGNOSIS — Z992 Dependence on renal dialysis: Secondary | ICD-10-CM

## 2014-02-02 LAB — HEPATIC FUNCTION PANEL
ALK PHOS: 67 U/L (ref 39–117)
ALT: 20 U/L (ref 0–35)
AST: 20 U/L (ref 0–37)
Albumin: 3.5 g/dL (ref 3.5–5.2)
Bilirubin, Direct: 0 mg/dL (ref 0.0–0.3)
TOTAL PROTEIN: 6.4 g/dL (ref 6.0–8.3)
Total Bilirubin: 0.6 mg/dL (ref 0.2–1.2)

## 2014-02-02 LAB — LIPID PANEL
CHOLESTEROL: 125 mg/dL (ref 0–200)
HDL: 68.8 mg/dL (ref >39.00)
LDL CALC: 48 mg/dL (ref 0–99)
NONHDL: 56.2
Total CHOL/HDL Ratio: 2
Triglycerides: 39 mg/dL (ref 0.0–149.0)
VLDL: 7.8 mg/dL (ref 0.0–40.0)

## 2014-02-02 LAB — CBC WITH DIFFERENTIAL/PLATELET
BASOS PCT: 0.3 % (ref 0.0–3.0)
Basophils Absolute: 0 10*3/uL (ref 0.0–0.1)
Eosinophils Absolute: 0.5 10*3/uL (ref 0.0–0.7)
Eosinophils Relative: 5 % (ref 0.0–5.0)
HCT: 30.8 % — ABNORMAL LOW (ref 36.0–46.0)
Hemoglobin: 10 g/dL — ABNORMAL LOW (ref 12.0–15.0)
LYMPHS PCT: 13.7 % (ref 12.0–46.0)
Lymphs Abs: 1.4 10*3/uL (ref 0.7–4.0)
MCHC: 32.5 g/dL (ref 30.0–36.0)
MCV: 106.7 fl — ABNORMAL HIGH (ref 78.0–100.0)
MONOS PCT: 4 % (ref 3.0–12.0)
Monocytes Absolute: 0.4 10*3/uL (ref 0.1–1.0)
Neutro Abs: 7.7 10*3/uL (ref 1.4–7.7)
Neutrophils Relative %: 77 % (ref 43.0–77.0)
Platelets: 184 10*3/uL (ref 150.0–400.0)
RBC: 2.89 Mil/uL — AB (ref 3.87–5.11)
RDW: 14.8 % (ref 11.5–15.5)
WBC: 10 10*3/uL (ref 4.0–10.5)

## 2014-02-02 LAB — HEMOGLOBIN A1C: Hgb A1c MFr Bld: 4.6 % (ref 4.6–6.5)

## 2014-02-02 LAB — POCT RAPID STREP A (OFFICE): Rapid Strep A Screen: NEGATIVE

## 2014-02-02 MED ORDER — ROSUVASTATIN CALCIUM 40 MG PO TABS: 40.0000 mg | ORAL_TABLET | Freq: Every day | ORAL | Status: DC

## 2014-02-02 NOTE — Assessment & Plan Note (Signed)
Continue dialysis with [facility] in [place]. Left upper arm AV graft.

## 2014-02-02 NOTE — Assessment & Plan Note (Signed)
Nasal congestion and sore throat likely viral URI. Rapid strep negative. Encouraged prn Tylenol and benadryl. Follow up if symptoms are not improving.

## 2014-02-02 NOTE — Progress Notes (Signed)
Pre visit review using our clinic review tool, if applicable. No additional management support is needed unless otherwise documented below in the visit note. 

## 2014-02-02 NOTE — Assessment & Plan Note (Signed)
Recent anemia reported by pt. Will recheck CBC here and check stool IFOB.

## 2014-02-02 NOTE — Progress Notes (Signed)
Subjective:    Patient ID: Theresa [name], female    DOB: 07/29/1955, 58 y.o.   MRN: [id number]  HPI 58YO female presents for follow up.  DM- BG well controlled by report. Did not bring record today.  Met with GI Physician last week. Hgb was 8. Continues to have some nausea and abdominal pain. Occasional dark stools, but no gross blood in stool.  Today developed sore throat and runny nose. No fever or chills. No cough. Not taking anything for this.  Review of Systems  Constitutional: Negative for fever, chills and unexpected weight change.  HENT: Positive for congestion, postnasal drip, rhinorrhea and sore throat. Negative for ear discharge, ear pain, facial swelling, hearing loss, mouth sores, nosebleeds, sinus pressure, sneezing, tinnitus, trouble swallowing and voice change.   Eyes: Negative for pain, discharge, redness and visual disturbance.  Respiratory: Negative for cough, chest tightness, shortness of breath, wheezing and stridor.   Cardiovascular: Negative for chest pain, palpitations and leg swelling.  Gastrointestinal: Positive for nausea and abdominal pain (occasional). Negative for diarrhea, constipation, blood in stool, anal bleeding and rectal pain.  Musculoskeletal: Negative for arthralgias, myalgias, neck pain and neck stiffness.  Skin: Negative for color change and rash.  Neurological: Negative for dizziness, weakness, light-headedness and headaches.  Hematological: Negative for adenopathy.       Objective:    BP 166/58  Pulse 72  Temp(Src) 98.4 F (36.9 C) (Oral)  Resp 14  Ht 5' 7.5" (1.715 m)  Wt 166 lb 8 oz (75.524 kg)  BMI 25.68 kg/m2  SpO2 98% Physical Exam  Constitutional: She is oriented to person, place, and time. She appears well-developed and well-nourished. No distress.  HENT:  Head: Normocephalic and atraumatic.  Right Ear: Tympanic membrane, external ear and ear canal normal.  Left Ear: Tympanic membrane, external ear and ear canal normal.    Nose: Nose normal.  Mouth/Throat: Posterior oropharyngeal erythema present. No oropharyngeal exudate.  Eyes: Conjunctivae are normal. Pupils are equal, round, and reactive to light. Right eye exhibits no discharge. Left eye exhibits no discharge. No scleral icterus.  Neck: Normal range of motion. Neck supple. No tracheal deviation present. No thyromegaly present.  Cardiovascular: Normal rate, regular rhythm, normal heart sounds and intact distal pulses.  Exam reveals no gallop and no friction rub.   No murmur heard. Pulmonary/Chest: Effort normal and breath sounds normal. No accessory muscle usage. Not tachypneic. No respiratory distress. She has no decreased breath sounds. She has no wheezes. She has no rhonchi. She has no rales. She exhibits no tenderness.  Musculoskeletal: Normal range of motion. She exhibits no edema and no tenderness.  Lymphadenopathy:    She has cervical adenopathy.       Right cervical: No superficial cervical adenopathy present.      Left cervical: No superficial cervical adenopathy present.  Neurological: She is alert and oriented to person, place, and time. No cranial nerve deficit. She exhibits normal muscle tone. Coordination normal.  Skin: Skin is warm and dry. No rash noted. She is not diaphoretic. No erythema. No pallor.     Psychiatric: She has a normal mood and affect. Her behavior is normal. Judgment and thought content normal.          Assessment & Plan:   Problem List Items Addressed This Visit     Unprioritized   Anemia, unspecified     Recent anemia reported by pt. Will recheck CBC here and check stool IFOB.    Relevant Orders  CBC with Differential      Fecal occult blood, imunochemical   Diabetes mellitus, type II - Primary     Will check A1c with labs today. Continue current medications.    Relevant Orders      Hemoglobin A1c      Lipid panel      Hepatic function panel   ESRD on hemodialysis     Continue dialysis with [facility]  in [place]. Left upper arm AV graft.    Hypertension   Ingrown thumb nail, right     Will set up dermatology evaluation of right thumb nail which is rolled laterally    Relevant Orders      Ambulatory referral to Dermatology   Viral URI     Nasal congestion and sore throat likely viral URI. Rapid strep negative. Encouraged prn Tylenol and benadryl. Follow up if symptoms are not improving.     Other Visit Diagnoses   Sore throat        Relevant Orders       POCT rapid strep A (Completed)        Return in about 3 months (around 05/05/2014) for Recheck of Diabetes.

## 2014-02-02 NOTE — Assessment & Plan Note (Signed)
Will set up dermatology evaluation of right thumb nail which is rolled laterally

## 2014-02-02 NOTE — Assessment & Plan Note (Signed)
Will check A1c with labs today. Continue current medications. 

## 2014-02-02 NOTE — Patient Instructions (Signed)
Labs today.  We will set up an evaluation with dermatology.

## 2014-02-07 ENCOUNTER — Encounter: Payer: Self-pay | Admitting: Cardiovascular Disease

## 2014-02-07 DIAGNOSIS — Z992 Dependence on renal dialysis: Secondary | ICD-10-CM | POA: Diagnosis not present

## 2014-02-07 DIAGNOSIS — N186 End stage renal disease: Secondary | ICD-10-CM | POA: Diagnosis not present

## 2014-02-08 DIAGNOSIS — D631 Anemia in chronic kidney disease: Secondary | ICD-10-CM | POA: Diagnosis not present

## 2014-02-08 DIAGNOSIS — N2581 Secondary hyperparathyroidism of renal origin: Secondary | ICD-10-CM | POA: Diagnosis not present

## 2014-02-08 DIAGNOSIS — N039 Chronic nephritic syndrome with unspecified morphologic changes: Secondary | ICD-10-CM | POA: Diagnosis not present

## 2014-02-08 DIAGNOSIS — D509 Iron deficiency anemia, unspecified: Secondary | ICD-10-CM | POA: Diagnosis not present

## 2014-02-08 DIAGNOSIS — N186 End stage renal disease: Secondary | ICD-10-CM | POA: Diagnosis not present

## 2014-02-09 ENCOUNTER — Ambulatory Visit (INDEPENDENT_AMBULATORY_CARE_PROVIDER_SITE_OTHER): Payer: BC Managed Care – PPO | Admitting: Ophthalmology

## 2014-02-12 DIAGNOSIS — R0989 Other specified symptoms and signs involving the circulatory and respiratory systems: Secondary | ICD-10-CM | POA: Diagnosis not present

## 2014-02-12 DIAGNOSIS — R0609 Other forms of dyspnea: Secondary | ICD-10-CM | POA: Diagnosis not present

## 2014-02-13 ENCOUNTER — Inpatient Hospital Stay: Payer: Self-pay | Admitting: Internal Medicine

## 2014-02-13 DIAGNOSIS — J9600 Acute respiratory failure, unspecified whether with hypoxia or hypercapnia: Secondary | ICD-10-CM | POA: Diagnosis not present

## 2014-02-13 DIAGNOSIS — R0602 Shortness of breath: Secondary | ICD-10-CM | POA: Diagnosis not present

## 2014-02-13 DIAGNOSIS — Z9861 Coronary angioplasty status: Secondary | ICD-10-CM | POA: Diagnosis not present

## 2014-02-13 DIAGNOSIS — E11319 Type 2 diabetes mellitus with unspecified diabetic retinopathy without macular edema: Secondary | ICD-10-CM | POA: Diagnosis present

## 2014-02-13 DIAGNOSIS — I509 Heart failure, unspecified: Secondary | ICD-10-CM | POA: Diagnosis not present

## 2014-02-13 DIAGNOSIS — J449 Chronic obstructive pulmonary disease, unspecified: Secondary | ICD-10-CM | POA: Diagnosis not present

## 2014-02-13 DIAGNOSIS — I2510 Atherosclerotic heart disease of native coronary artery without angina pectoris: Secondary | ICD-10-CM | POA: Diagnosis present

## 2014-02-13 DIAGNOSIS — M7020 Olecranon bursitis, unspecified elbow: Secondary | ICD-10-CM | POA: Diagnosis present

## 2014-02-13 DIAGNOSIS — I120 Hypertensive chronic kidney disease with stage 5 chronic kidney disease or end stage renal disease: Secondary | ICD-10-CM | POA: Diagnosis not present

## 2014-02-13 DIAGNOSIS — E1142 Type 2 diabetes mellitus with diabetic polyneuropathy: Secondary | ICD-10-CM | POA: Diagnosis not present

## 2014-02-13 DIAGNOSIS — R079 Chest pain, unspecified: Secondary | ICD-10-CM | POA: Diagnosis not present

## 2014-02-13 DIAGNOSIS — M19029 Primary osteoarthritis, unspecified elbow: Secondary | ICD-10-CM | POA: Diagnosis present

## 2014-02-13 DIAGNOSIS — Z992 Dependence on renal dialysis: Secondary | ICD-10-CM | POA: Diagnosis not present

## 2014-02-13 DIAGNOSIS — E1139 Type 2 diabetes mellitus with other diabetic ophthalmic complication: Secondary | ICD-10-CM | POA: Diagnosis present

## 2014-02-13 DIAGNOSIS — Z794 Long term (current) use of insulin: Secondary | ICD-10-CM | POA: Diagnosis not present

## 2014-02-13 DIAGNOSIS — E875 Hyperkalemia: Secondary | ICD-10-CM | POA: Diagnosis not present

## 2014-02-13 DIAGNOSIS — J811 Chronic pulmonary edema: Secondary | ICD-10-CM | POA: Diagnosis not present

## 2014-02-13 DIAGNOSIS — E1149 Type 2 diabetes mellitus with other diabetic neurological complication: Secondary | ICD-10-CM | POA: Diagnosis present

## 2014-02-13 DIAGNOSIS — D631 Anemia in chronic kidney disease: Secondary | ICD-10-CM | POA: Diagnosis not present

## 2014-02-13 DIAGNOSIS — I252 Old myocardial infarction: Secondary | ICD-10-CM | POA: Diagnosis not present

## 2014-02-13 DIAGNOSIS — N2581 Secondary hyperparathyroidism of renal origin: Secondary | ICD-10-CM | POA: Diagnosis not present

## 2014-02-13 DIAGNOSIS — N186 End stage renal disease: Secondary | ICD-10-CM | POA: Diagnosis not present

## 2014-02-13 DIAGNOSIS — M25529 Pain in unspecified elbow: Secondary | ICD-10-CM | POA: Diagnosis not present

## 2014-02-13 DIAGNOSIS — J4489 Other specified chronic obstructive pulmonary disease: Secondary | ICD-10-CM | POA: Diagnosis not present

## 2014-02-13 LAB — COMPREHENSIVE METABOLIC PANEL
ALT: 22 U/L
AST: 37 U/L (ref 15–37)
Albumin: 3.2 g/dL — ABNORMAL LOW (ref 3.4–5.0)
Alkaline Phosphatase: 83 U/L
Anion Gap: 10 (ref 7–16)
BUN: 74 mg/dL — ABNORMAL HIGH (ref 7–18)
Bilirubin,Total: 0.2 mg/dL (ref 0.2–1.0)
Calcium, Total: 8.6 mg/dL (ref 8.5–10.1)
Chloride: 105 mmol/L (ref 98–107)
Co2: 26 mmol/L (ref 21–32)
Creatinine: 10.84 mg/dL — ABNORMAL HIGH (ref 0.60–1.30)
EGFR (Non-African Amer.): 4 — ABNORMAL LOW
GFR CALC AF AMER: 4 — AB
GLUCOSE: 143 mg/dL — AB (ref 65–99)
Osmolality: 306 (ref 275–301)
Potassium: 6.1 mmol/L — ABNORMAL HIGH (ref 3.5–5.1)
Sodium: 141 mmol/L (ref 136–145)
Total Protein: 6.7 g/dL (ref 6.4–8.2)

## 2014-02-13 LAB — CBC
HCT: 30.9 % — ABNORMAL LOW (ref 35.0–47.0)
HGB: 10 g/dL — AB (ref 12.0–16.0)
MCH: 34.9 pg — ABNORMAL HIGH (ref 26.0–34.0)
MCHC: 32.3 g/dL (ref 32.0–36.0)
MCV: 108 fL — AB (ref 80–100)
Platelet: 222 10*3/uL (ref 150–440)
RBC: 2.86 10*6/uL — ABNORMAL LOW (ref 3.80–5.20)
RDW: 14.7 % — AB (ref 11.5–14.5)
WBC: 11.9 10*3/uL — ABNORMAL HIGH (ref 3.6–11.0)

## 2014-02-13 LAB — POTASSIUM: Potassium: 5.7 mmol/L — ABNORMAL HIGH (ref 3.5–5.1)

## 2014-02-13 LAB — TROPONIN I
TROPONIN-I: 0.02 ng/mL
TROPONIN-I: 0.02 ng/mL

## 2014-02-13 LAB — PHOSPHORUS: Phosphorus: 3.9 mg/dL (ref 2.5–4.9)

## 2014-02-14 LAB — POTASSIUM: Potassium: 3.8 mmol/L (ref 3.5–5.1)

## 2014-02-14 LAB — TROPONIN I: Troponin-I: 0.03 ng/mL

## 2014-02-15 ENCOUNTER — Telehealth: Payer: Self-pay | Admitting: Internal Medicine

## 2014-02-15 LAB — CBC WITH DIFFERENTIAL/PLATELET
Basophil #: 0 10*3/uL (ref 0.0–0.1)
Basophil %: 0.4 %
Eosinophil #: 0.2 10*3/uL (ref 0.0–0.7)
Eosinophil %: 1.2 %
HCT: 25.2 % — AB (ref 35.0–47.0)
HGB: 8 g/dL — ABNORMAL LOW (ref 12.0–16.0)
LYMPHS ABS: 2.4 10*3/uL (ref 1.0–3.6)
Lymphocyte %: 18.9 %
MCH: 34.1 pg — ABNORMAL HIGH (ref 26.0–34.0)
MCHC: 31.7 g/dL — ABNORMAL LOW (ref 32.0–36.0)
MCV: 108 fL — ABNORMAL HIGH (ref 80–100)
MONO ABS: 0.5 x10 3/mm (ref 0.2–0.9)
Monocyte %: 4.3 %
Neutrophil #: 9.6 10*3/uL — ABNORMAL HIGH (ref 1.4–6.5)
Neutrophil %: 75.2 %
PLATELETS: 170 10*3/uL (ref 150–440)
RBC: 2.34 10*6/uL — ABNORMAL LOW (ref 3.80–5.20)
RDW: 15.5 % — ABNORMAL HIGH (ref 11.5–14.5)
WBC: 12.8 10*3/uL — ABNORMAL HIGH (ref 3.6–11.0)

## 2014-02-15 LAB — RENAL FUNCTION PANEL
ANION GAP: 9 (ref 7–16)
Albumin: 3.1 g/dL — ABNORMAL LOW (ref 3.4–5.0)
BUN: 72 mg/dL — AB (ref 7–18)
CALCIUM: 8.7 mg/dL (ref 8.5–10.1)
Chloride: 96 mmol/L — ABNORMAL LOW (ref 98–107)
Co2: 29 mmol/L (ref 21–32)
Creatinine: 9.77 mg/dL — ABNORMAL HIGH (ref 0.60–1.30)
EGFR (African American): 5 — ABNORMAL LOW
EGFR (Non-African Amer.): 4 — ABNORMAL LOW
Glucose: 127 mg/dL — ABNORMAL HIGH (ref 65–99)
Osmolality: 291 (ref 275–301)
POTASSIUM: 4.7 mmol/L (ref 3.5–5.1)
Phosphorus: 4 mg/dL (ref 2.5–4.9)
SODIUM: 134 mmol/L — AB (ref 136–145)

## 2014-02-15 NOTE — Telephone Encounter (Signed)
[facility] ED called and stated pt was seen in the er for erfd and cad and is needing a 1-2 week follow up. Would only like Tuesdays and thursdays. Seen an appt on sept 15th @ 8:30?

## 2014-02-18 LAB — CULTURE, BLOOD (SINGLE)

## 2014-02-20 DIAGNOSIS — N186 End stage renal disease: Secondary | ICD-10-CM | POA: Diagnosis not present

## 2014-02-21 ENCOUNTER — Encounter: Payer: Self-pay | Admitting: Internal Medicine

## 2014-02-21 ENCOUNTER — Ambulatory Visit (INDEPENDENT_AMBULATORY_CARE_PROVIDER_SITE_OTHER): Payer: Medicare Other | Admitting: Internal Medicine

## 2014-02-21 VITALS — BP 140/52 | HR 77 | Temp 98.2°F | Ht 67.5 in | Wt 172.2 lb

## 2014-02-21 DIAGNOSIS — I2510 Atherosclerotic heart disease of native coronary artery without angina pectoris: Secondary | ICD-10-CM | POA: Diagnosis not present

## 2014-02-21 DIAGNOSIS — R5383 Other fatigue: Secondary | ICD-10-CM | POA: Diagnosis not present

## 2014-02-21 DIAGNOSIS — E118 Type 2 diabetes mellitus with unspecified complications: Secondary | ICD-10-CM | POA: Diagnosis not present

## 2014-02-21 DIAGNOSIS — Z992 Dependence on renal dialysis: Secondary | ICD-10-CM

## 2014-02-21 DIAGNOSIS — N186 End stage renal disease: Secondary | ICD-10-CM

## 2014-02-21 DIAGNOSIS — Z23 Encounter for immunization: Secondary | ICD-10-CM

## 2014-02-21 DIAGNOSIS — R5381 Other malaise: Secondary | ICD-10-CM | POA: Diagnosis not present

## 2014-02-21 NOTE — Progress Notes (Signed)
Pre visit review using our clinic review tool, if applicable. No additional management support is needed unless otherwise documented below in the visit note. 

## 2014-02-21 NOTE — Progress Notes (Signed)
Subjective:    Patient ID: Theresa [name], female    DOB: [birth year], 58 y.o.   MRN: [id number]  HPI 58YO female presents for hospital follow up.  Admitted 9/7 Discharged 9/9 Diagnosis - respiratory distress, hyperkalemia  Noted on admission to have K 6.2. Attributed to eating bananas and iced tea in can. Did not miss any sessions of HD. Found to have gout in elbow. Started on steroid taper with some improvement. Steroids led to weight gain.  Feeling tired. No shortness of breath. No chest pain. Occasional nausea, however this has been chronic for her. No fever, chills. Had labs yesterday at [facility]. Unsure results.  DM - BG near 70 mostly. Compliant with Levemir 15units daily. Last A1c<5%.  Review of Systems  Constitutional: Positive for fatigue. Negative for fever, chills, appetite change and unexpected weight change.  Eyes: Negative for visual disturbance.  Respiratory: Negative for shortness of breath.   Cardiovascular: Negative for chest pain and leg swelling.  Gastrointestinal: Positive for nausea. Negative for abdominal pain, diarrhea and constipation.  Musculoskeletal: Positive for arthralgias and myalgias.  Skin: Negative for color change and rash.  Hematological: Negative for adenopathy. Does not bruise/bleed easily.  Psychiatric/Behavioral: Negative for dysphoric mood. The patient is not nervous/anxious.        Objective:    BP 140/52  Pulse 77  Temp(Src) 98.2 F (36.8 C) (Oral)  Ht 5' 7.5" (1.715 m)  Wt 172 lb 4 oz (78.132 kg)  BMI 26.56 kg/m2  SpO2 91% Physical Exam  Constitutional: She is oriented to person, place, and time. She appears well-developed and well-nourished. No distress.  HENT:  Head: Normocephalic and atraumatic.  Right Ear: External ear normal.  Left Ear: External ear normal.  Nose: Nose normal.  Mouth/Throat: Oropharynx is clear and moist. No oropharyngeal exudate.  Eyes: Conjunctivae are normal. Pupils are equal, round, and reactive  to light. Right eye exhibits no discharge. Left eye exhibits no discharge. No scleral icterus.  Neck: Normal range of motion. Neck supple. No tracheal deviation present. No thyromegaly present.  Cardiovascular: Normal rate, regular rhythm, normal heart sounds and intact distal pulses.  Exam reveals no gallop and no friction rub.   No murmur heard. Pulmonary/Chest: Effort normal and breath sounds normal. No accessory muscle usage. Not tachypneic. No respiratory distress. She has no decreased breath sounds. She has no wheezes. She has no rhonchi. She has no rales. She exhibits no tenderness.  Musculoskeletal: Normal range of motion. She exhibits no edema and no tenderness.  Lymphadenopathy:    She has no cervical adenopathy.  Neurological: She is alert and oriented to person, place, and time. No cranial nerve deficit. She exhibits normal muscle tone. Coordination normal.  Skin: Skin is warm and dry. No rash noted. She is not diaphoretic. No erythema. No pallor.  Psychiatric: She has a normal mood and affect. Her behavior is normal. Judgment and thought content normal.          Assessment & Plan:   Problem List Items Addressed This Visit     Unprioritized   Diabetes mellitus, type II     BG running low. Will decrease Levemir to 10units daily. Monitor BG closely with follow up in 4 weeks.    ESRD on hemodialysis - Primary     Continue HD at [facility]. On transplant list. Will request recent labs form [facility].    Other malaise and fatigue     Symptoms likely related to low BG. Will decrease Levemir to  10units daily. Follow up in 4 weeks or sooner if persistent BG less than 70.        Return in about 4 weeks (around 03/21/2014) for Recheck.

## 2014-02-21 NOTE — Assessment & Plan Note (Signed)
Symptoms likely related to low BG. Will decrease Levemir to 10units daily. Follow up in 4 weeks or sooner if persistent BG less than 70.

## 2014-02-21 NOTE — Addendum Note (Signed)
Addended by: Vernetta Honey on: 02/21/2014 08:58 AM   Modules accepted: Orders

## 2014-02-21 NOTE — Assessment & Plan Note (Signed)
Continue HD at [facility]. On transplant list. Will request recent labs form [facility].

## 2014-02-21 NOTE — Patient Instructions (Signed)
Decrease Levemir to 10units daily. Monitor blood sugars 1-2 times daily and call if any sugars less than 70.

## 2014-02-21 NOTE — Assessment & Plan Note (Signed)
BG running low. Will decrease Levemir to 10units daily. Monitor BG closely with follow up in 4 weeks.

## 2014-02-23 ENCOUNTER — Encounter: Payer: Self-pay | Admitting: Podiatry

## 2014-02-23 ENCOUNTER — Ambulatory Visit (INDEPENDENT_AMBULATORY_CARE_PROVIDER_SITE_OTHER): Payer: Medicare Other | Admitting: Podiatry

## 2014-02-23 ENCOUNTER — Ambulatory Visit: Payer: Medicare Other | Admitting: Podiatrist

## 2014-02-23 DIAGNOSIS — B351 Tinea unguium: Secondary | ICD-10-CM

## 2014-02-23 DIAGNOSIS — M79609 Pain in unspecified limb: Secondary | ICD-10-CM

## 2014-02-23 DIAGNOSIS — E1140 Type 2 diabetes mellitus with diabetic neuropathy, unspecified: Secondary | ICD-10-CM

## 2014-02-23 DIAGNOSIS — M79676 Pain in unspecified toe(s): Secondary | ICD-10-CM

## 2014-02-23 NOTE — Patient Instructions (Signed)
Diabetes and Foot Care Diabetes may cause you to have problems because of poor blood supply (circulation) to your feet and legs. This may cause the skin on your feet to become thinner, break easier, and heal more slowly. Your skin may become dry, and the skin may peel and crack. You may also have nerve damage in your legs and feet causing decreased feeling in them. You may not notice minor injuries to your feet that could lead to infections or more serious problems. Taking care of your feet is one of the most important things you can do for yourself.  HOME CARE INSTRUCTIONS  Wear shoes at all times, even in the house. Do not go barefoot. Bare feet are easily injured.  Check your feet daily for blisters, cuts, and redness. If you cannot see the bottom of your feet, use a mirror or ask someone for help.  Wash your feet with warm water (do not use hot water) and mild soap. Then pat your feet and the areas between your toes until they are completely dry. Do not soak your feet as this can dry your skin.  Apply a moisturizing lotion or petroleum jelly (that does not contain alcohol and is unscented) to the skin on your feet and to dry, brittle toenails. Do not apply lotion between your toes.  Trim your toenails straight across. Do not dig under them or around the cuticle. File the edges of your nails with an emery board or nail file.  Do not cut corns or calluses or try to remove them with medicine.  Wear clean socks or stockings every day. Make sure they are not too tight. Do not wear knee-high stockings since they may decrease blood flow to your legs.  Wear shoes that fit properly and have enough cushioning. To break in new shoes, wear them for just a few hours a day. This prevents you from injuring your feet. Always look in your shoes before you put them on to be sure there are no objects inside.  Do not cross your legs. This may decrease the blood flow to your feet.  If you find a minor scrape,  cut, or break in the skin on your feet, keep it and the skin around it clean and dry. These areas may be cleansed with mild soap and water. Do not cleanse the area with peroxide, alcohol, or iodine.  When you remove an adhesive bandage, be sure not to damage the skin around it.  If you have a wound, look at it several times a day to make sure it is healing.  Do not use heating pads or hot water bottles. They may burn your skin. If you have lost feeling in your feet or legs, you may not know it is happening until it is too late.  Make sure your health care provider performs a complete foot exam at least annually or more often if you have foot problems. Report any cuts, sores, or bruises to your health care provider immediately. SEEK MEDICAL CARE IF:   You have an injury that is not healing.  You have cuts or breaks in the skin.  You have an ingrown nail.  You notice redness on your legs or feet.  You feel burning or tingling in your legs or feet.  You have pain or cramps in your legs and feet.  Your legs or feet are numb.  Your feet always feel cold. SEEK IMMEDIATE MEDICAL CARE IF:   There is increasing redness,   swelling, or pain in or around a wound.  There is a red line that goes up your leg.  Pus is coming from a wound.  You develop a fever or as directed by your health care provider.  You notice a bad smell coming from an ulcer or wound. Document Released: 05/23/2000 Document Revised: 01/26/2013 Document Reviewed: 11/02/2012 Exit[employer] Patient Information 2015 [employer]. This information is not intended to replace advice given to you by your health care provider. Make sure you discuss any questions you have with your health care provider.  

## 2014-02-24 ENCOUNTER — Ambulatory Visit: Payer: Medicare Other | Admitting: Internal Medicine

## 2014-02-24 NOTE — Progress Notes (Signed)
Patient ID: SAKIRA DELZELL, female   DOB: 1955-09-29, 58 y.o.   MRN: [id number]  Subjective: Ms. Durban, 58 year old female, returns the office today for diabetic risk assessment and painful elongated nails. States that her nails are grossly symptomatic with shoe gear. Her last HbA1c on 02/02/2014 was 4.6. States that she has diabetic neuropathy. Denies any claudication symptoms. No other complaints at this time.  Objective: AAO x3, NAD DP/PT pulses palpable b/l, CRT < 3sec Protective sensation decreased with SWMF b/l.  Nails hypertrophic, dystrophic, discolored, brittle x10. Surrounding erythema or drainage from around the nail sites. No open lesions or pre-ulcerative lesions. Forefoot cavus deformity with prominent metatarsal heads bilaterally. No leg pain, swelling, warmth.  Assessment: 58 year old female with symptomatic onychomycosis.  Plan: -Various treatment options discussed including alternatives, risks, complications. -Nails sharply debrided [id number] without complications. -Discussed the importance of daily foot inspection. -Followup in 3 months or sooner if any problems are to arise or any changes symptoms.  In the meantime call any questions, concerns, changes symptoms.

## 2014-03-01 ENCOUNTER — Other Ambulatory Visit: Payer: Self-pay | Admitting: Nephrology

## 2014-03-01 DIAGNOSIS — E875 Hyperkalemia: Secondary | ICD-10-CM | POA: Diagnosis not present

## 2014-03-01 DIAGNOSIS — N186 End stage renal disease: Secondary | ICD-10-CM | POA: Diagnosis not present

## 2014-03-01 LAB — POTASSIUM
POTASSIUM: 5.3 mmol/L — AB (ref 3.5–5.1)
POTASSIUM: 3.2 mmol/L — AB (ref 3.5–5.1)

## 2014-03-07 DIAGNOSIS — Z940 Kidney transplant status: Secondary | ICD-10-CM | POA: Diagnosis not present

## 2014-03-07 DIAGNOSIS — I2510 Atherosclerotic heart disease of native coronary artery without angina pectoris: Secondary | ICD-10-CM | POA: Diagnosis not present

## 2014-03-07 DIAGNOSIS — Z01810 Encounter for preprocedural cardiovascular examination: Secondary | ICD-10-CM | POA: Diagnosis not present

## 2014-03-07 DIAGNOSIS — N189 Chronic kidney disease, unspecified: Secondary | ICD-10-CM | POA: Diagnosis not present

## 2014-03-07 DIAGNOSIS — N186 End stage renal disease: Secondary | ICD-10-CM | POA: Diagnosis not present

## 2014-03-09 ENCOUNTER — Encounter: Payer: Self-pay | Admitting: Cardiovascular Disease

## 2014-03-09 DIAGNOSIS — Z992 Dependence on renal dialysis: Secondary | ICD-10-CM | POA: Diagnosis not present

## 2014-03-09 DIAGNOSIS — N186 End stage renal disease: Secondary | ICD-10-CM | POA: Diagnosis not present

## 2014-03-10 DIAGNOSIS — Z992 Dependence on renal dialysis: Secondary | ICD-10-CM | POA: Diagnosis not present

## 2014-03-10 DIAGNOSIS — N2581 Secondary hyperparathyroidism of renal origin: Secondary | ICD-10-CM | POA: Diagnosis not present

## 2014-03-10 DIAGNOSIS — D631 Anemia in chronic kidney disease: Secondary | ICD-10-CM | POA: Diagnosis not present

## 2014-03-10 DIAGNOSIS — D509 Iron deficiency anemia, unspecified: Secondary | ICD-10-CM | POA: Diagnosis not present

## 2014-03-10 DIAGNOSIS — N186 End stage renal disease: Secondary | ICD-10-CM | POA: Diagnosis not present

## 2014-03-13 DIAGNOSIS — Z992 Dependence on renal dialysis: Secondary | ICD-10-CM | POA: Diagnosis not present

## 2014-03-13 DIAGNOSIS — N186 End stage renal disease: Secondary | ICD-10-CM | POA: Diagnosis not present

## 2014-03-13 DIAGNOSIS — N2581 Secondary hyperparathyroidism of renal origin: Secondary | ICD-10-CM | POA: Diagnosis not present

## 2014-03-13 DIAGNOSIS — D631 Anemia in chronic kidney disease: Secondary | ICD-10-CM | POA: Diagnosis not present

## 2014-03-13 DIAGNOSIS — D509 Iron deficiency anemia, unspecified: Secondary | ICD-10-CM | POA: Diagnosis not present

## 2014-03-15 DIAGNOSIS — N2581 Secondary hyperparathyroidism of renal origin: Secondary | ICD-10-CM | POA: Diagnosis not present

## 2014-03-15 DIAGNOSIS — N186 End stage renal disease: Secondary | ICD-10-CM | POA: Diagnosis not present

## 2014-03-15 DIAGNOSIS — D509 Iron deficiency anemia, unspecified: Secondary | ICD-10-CM | POA: Diagnosis not present

## 2014-03-15 DIAGNOSIS — D631 Anemia in chronic kidney disease: Secondary | ICD-10-CM | POA: Diagnosis not present

## 2014-03-15 DIAGNOSIS — Z992 Dependence on renal dialysis: Secondary | ICD-10-CM | POA: Diagnosis not present

## 2014-03-17 DIAGNOSIS — D631 Anemia in chronic kidney disease: Secondary | ICD-10-CM | POA: Diagnosis not present

## 2014-03-17 DIAGNOSIS — N186 End stage renal disease: Secondary | ICD-10-CM | POA: Diagnosis not present

## 2014-03-17 DIAGNOSIS — Z992 Dependence on renal dialysis: Secondary | ICD-10-CM | POA: Diagnosis not present

## 2014-03-17 DIAGNOSIS — N2581 Secondary hyperparathyroidism of renal origin: Secondary | ICD-10-CM | POA: Diagnosis not present

## 2014-03-17 DIAGNOSIS — D509 Iron deficiency anemia, unspecified: Secondary | ICD-10-CM | POA: Diagnosis not present

## 2014-03-20 DIAGNOSIS — D509 Iron deficiency anemia, unspecified: Secondary | ICD-10-CM | POA: Diagnosis not present

## 2014-03-20 DIAGNOSIS — N186 End stage renal disease: Secondary | ICD-10-CM | POA: Diagnosis not present

## 2014-03-20 DIAGNOSIS — D631 Anemia in chronic kidney disease: Secondary | ICD-10-CM | POA: Diagnosis not present

## 2014-03-20 DIAGNOSIS — N2581 Secondary hyperparathyroidism of renal origin: Secondary | ICD-10-CM | POA: Diagnosis not present

## 2014-03-20 DIAGNOSIS — Z992 Dependence on renal dialysis: Secondary | ICD-10-CM | POA: Diagnosis not present

## 2014-03-20 DIAGNOSIS — E1122 Type 2 diabetes mellitus with diabetic chronic kidney disease: Secondary | ICD-10-CM | POA: Diagnosis not present

## 2014-03-22 DIAGNOSIS — N2581 Secondary hyperparathyroidism of renal origin: Secondary | ICD-10-CM | POA: Diagnosis not present

## 2014-03-22 DIAGNOSIS — Z992 Dependence on renal dialysis: Secondary | ICD-10-CM | POA: Diagnosis not present

## 2014-03-22 DIAGNOSIS — D631 Anemia in chronic kidney disease: Secondary | ICD-10-CM | POA: Diagnosis not present

## 2014-03-22 DIAGNOSIS — D509 Iron deficiency anemia, unspecified: Secondary | ICD-10-CM | POA: Diagnosis not present

## 2014-03-22 DIAGNOSIS — N186 End stage renal disease: Secondary | ICD-10-CM | POA: Diagnosis not present

## 2014-03-24 DIAGNOSIS — N186 End stage renal disease: Secondary | ICD-10-CM | POA: Diagnosis not present

## 2014-03-24 DIAGNOSIS — Z992 Dependence on renal dialysis: Secondary | ICD-10-CM | POA: Diagnosis not present

## 2014-03-24 DIAGNOSIS — D631 Anemia in chronic kidney disease: Secondary | ICD-10-CM | POA: Diagnosis not present

## 2014-03-24 DIAGNOSIS — D509 Iron deficiency anemia, unspecified: Secondary | ICD-10-CM | POA: Diagnosis not present

## 2014-03-24 DIAGNOSIS — N2581 Secondary hyperparathyroidism of renal origin: Secondary | ICD-10-CM | POA: Diagnosis not present

## 2014-03-27 DIAGNOSIS — D631 Anemia in chronic kidney disease: Secondary | ICD-10-CM | POA: Diagnosis not present

## 2014-03-27 DIAGNOSIS — N186 End stage renal disease: Secondary | ICD-10-CM | POA: Diagnosis not present

## 2014-03-27 DIAGNOSIS — Z992 Dependence on renal dialysis: Secondary | ICD-10-CM | POA: Diagnosis not present

## 2014-03-27 DIAGNOSIS — N2581 Secondary hyperparathyroidism of renal origin: Secondary | ICD-10-CM | POA: Diagnosis not present

## 2014-03-27 DIAGNOSIS — D509 Iron deficiency anemia, unspecified: Secondary | ICD-10-CM | POA: Diagnosis not present

## 2014-03-28 ENCOUNTER — Encounter: Payer: Self-pay | Admitting: Internal Medicine

## 2014-03-28 ENCOUNTER — Ambulatory Visit (INDEPENDENT_AMBULATORY_CARE_PROVIDER_SITE_OTHER): Payer: Medicare Other | Admitting: Internal Medicine

## 2014-03-28 VITALS — BP 162/60 | HR 72 | Temp 98.2°F | Ht 67.5 in | Wt 170.2 lb

## 2014-03-28 DIAGNOSIS — I10 Essential (primary) hypertension: Secondary | ICD-10-CM | POA: Diagnosis not present

## 2014-03-28 DIAGNOSIS — E118 Type 2 diabetes mellitus with unspecified complications: Secondary | ICD-10-CM | POA: Diagnosis not present

## 2014-03-28 DIAGNOSIS — I2510 Atherosclerotic heart disease of native coronary artery without angina pectoris: Secondary | ICD-10-CM

## 2014-03-28 DIAGNOSIS — N186 End stage renal disease: Secondary | ICD-10-CM | POA: Diagnosis not present

## 2014-03-28 DIAGNOSIS — Z992 Dependence on renal dialysis: Secondary | ICD-10-CM

## 2014-03-28 MED ORDER — INSULIN DETEMIR 100 UNIT/ML FLEXPEN: 5.0000 [IU] | PEN_INJECTOR | Freq: Every day | SUBCUTANEOUS | Status: DC

## 2014-03-28 MED ORDER — PREGABALIN 75 MG PO CAPS: 75.0000 mg | ORAL_CAPSULE | Freq: Every day | ORAL | Status: DC

## 2014-03-28 NOTE — Patient Instructions (Signed)
Decrease Levemir to 5units daily.   Plan to repeat labs in 6 weeks.

## 2014-03-28 NOTE — Progress Notes (Signed)
Pre visit review using our clinic review tool, if applicable. No additional management support is needed unless otherwise documented below in the visit note. 

## 2014-03-28 NOTE — Assessment & Plan Note (Signed)
Tolerating HD well except for occasional hypotension. Will request recent labs from [facility]. Plans to be back on transplant list in 05/2014.

## 2014-03-28 NOTE — Progress Notes (Signed)
Subjective:    Patient ID: Theresa [name], female    DOB: 1956/01/04, 58 y.o.   MRN: [id number]  HPI 58YO female presents for follow up.  Recently evaluated in hospital follow up. Noted to have some low blood sugars. Dose of Levemir was decreased to 10units daily. Since then, BG in 80s fasting. In the afternoon, max BG reading 150.  ESRD - Tolerating dialysis well. No recent issues with access. Has some hypotension during dialysis, readings in the 70s/40s. Treated with increased fluid.  No new concerns today.   Review of Systems  Constitutional: Negative for fever, chills, appetite change, fatigue and unexpected weight change.  Eyes: Negative for visual disturbance.  Respiratory: Negative for shortness of breath.   Cardiovascular: Negative for chest pain and leg swelling.  Gastrointestinal: Positive for nausea (couple of times per week. Improved compared to previous.). Negative for vomiting, abdominal pain, diarrhea and constipation.  Musculoskeletal: Positive for myalgias.  Skin: Negative for color change and rash.  Hematological: Negative for adenopathy. Does not bruise/bleed easily.  Psychiatric/Behavioral: Negative for sleep disturbance and dysphoric mood. The patient is not nervous/anxious.        Objective:    BP 162/60  Pulse 72  Temp(Src) 98.2 F (36.8 C) (Oral)  Ht 5' 7.5" (1.715 m)  Wt 170 lb 4 oz (77.225 kg)  BMI 26.26 kg/m2  SpO2 99% Physical Exam  Constitutional: She is oriented to person, place, and time. She appears well-developed and well-nourished. No distress.  HENT:  Head: Normocephalic and atraumatic.  Right Ear: External ear normal.  Left Ear: External ear normal.  Nose: Nose normal.  Mouth/Throat: Oropharynx is clear and moist. No oropharyngeal exudate.  Eyes: Conjunctivae are normal. Pupils are equal, round, and reactive to light. Right eye exhibits no discharge. Left eye exhibits no discharge. No scleral icterus.  Neck: Normal range of motion.  Neck supple. No tracheal deviation present. No thyromegaly present.  Cardiovascular: Normal rate, regular rhythm, normal heart sounds and intact distal pulses.  Exam reveals no gallop and no friction rub.   No murmur heard. Pulmonary/Chest: Effort normal and breath sounds normal. No accessory muscle usage. Not tachypneic. No respiratory distress. She has no decreased breath sounds. She has no wheezes. She has no rhonchi. She has no rales. She exhibits no tenderness.  Musculoskeletal: Normal range of motion. She exhibits no edema and no tenderness.  Lymphadenopathy:    She has no cervical adenopathy.  Neurological: She is alert and oriented to person, place, and time. No cranial nerve deficit. She exhibits normal muscle tone. Coordination normal.  Skin: Skin is warm and dry. No rash noted. She is not diaphoretic. No erythema. No pallor.  Psychiatric: She has a normal mood and affect. Her behavior is normal. Judgment and thought content normal.          Assessment & Plan:   Problem List Items Addressed This Visit     Unprioritized   Diabetes mellitus, type II - Primary     Will recheck A1c with labs in 04/2014. Given continued lower blood sugars, will further decrease Levemir to 5 units daily. She will call if any readings this month below 70.    Relevant Medications      Insulin Detemir (LEVEMIR) 100 UNIT/ML Pen   ESRD on hemodialysis     Tolerating HD well except for occasional hypotension. Will request recent labs from [facility]. Plans to be back on transplant list in 05/2014.    Essential (primary) hypertension  BP Readings from Last 3 Encounters:  03/28/14 162/60  02/21/14 140/52  02/02/14 166/58   BP elevated today, however having hypotension with dialysis, so will not make changes to medications at this time. Continue to monitor.        Return in about 6 weeks (around 05/09/2014) for Recheck of Diabetes.

## 2014-03-28 NOTE — Assessment & Plan Note (Signed)
Will recheck A1c with labs in 04/2014. Given continued lower blood sugars, will further decrease Levemir to 5 units daily. She will call if any readings this month below 70.

## 2014-03-28 NOTE — Assessment & Plan Note (Signed)
BP Readings from Last 3 Encounters:  03/28/14 162/60  02/21/14 140/52  02/02/14 166/58   BP elevated today, however having hypotension with dialysis, so will not make changes to medications at this time. Continue to monitor.

## 2014-03-29 DIAGNOSIS — Z992 Dependence on renal dialysis: Secondary | ICD-10-CM | POA: Diagnosis not present

## 2014-03-29 DIAGNOSIS — N2581 Secondary hyperparathyroidism of renal origin: Secondary | ICD-10-CM | POA: Diagnosis not present

## 2014-03-29 DIAGNOSIS — D631 Anemia in chronic kidney disease: Secondary | ICD-10-CM | POA: Diagnosis not present

## 2014-03-29 DIAGNOSIS — N186 End stage renal disease: Secondary | ICD-10-CM | POA: Diagnosis not present

## 2014-03-29 DIAGNOSIS — D509 Iron deficiency anemia, unspecified: Secondary | ICD-10-CM | POA: Diagnosis not present

## 2014-03-31 DIAGNOSIS — D631 Anemia in chronic kidney disease: Secondary | ICD-10-CM | POA: Diagnosis not present

## 2014-03-31 DIAGNOSIS — N186 End stage renal disease: Secondary | ICD-10-CM | POA: Diagnosis not present

## 2014-03-31 DIAGNOSIS — Z992 Dependence on renal dialysis: Secondary | ICD-10-CM | POA: Diagnosis not present

## 2014-03-31 DIAGNOSIS — N2581 Secondary hyperparathyroidism of renal origin: Secondary | ICD-10-CM | POA: Diagnosis not present

## 2014-03-31 DIAGNOSIS — D509 Iron deficiency anemia, unspecified: Secondary | ICD-10-CM | POA: Diagnosis not present

## 2014-04-03 DIAGNOSIS — D509 Iron deficiency anemia, unspecified: Secondary | ICD-10-CM | POA: Diagnosis not present

## 2014-04-03 DIAGNOSIS — D631 Anemia in chronic kidney disease: Secondary | ICD-10-CM | POA: Diagnosis not present

## 2014-04-03 DIAGNOSIS — N2581 Secondary hyperparathyroidism of renal origin: Secondary | ICD-10-CM | POA: Diagnosis not present

## 2014-04-03 DIAGNOSIS — Z992 Dependence on renal dialysis: Secondary | ICD-10-CM | POA: Diagnosis not present

## 2014-04-03 DIAGNOSIS — N186 End stage renal disease: Secondary | ICD-10-CM | POA: Diagnosis not present

## 2014-04-05 DIAGNOSIS — N186 End stage renal disease: Secondary | ICD-10-CM | POA: Diagnosis not present

## 2014-04-05 DIAGNOSIS — Z992 Dependence on renal dialysis: Secondary | ICD-10-CM | POA: Diagnosis not present

## 2014-04-05 DIAGNOSIS — N2581 Secondary hyperparathyroidism of renal origin: Secondary | ICD-10-CM | POA: Diagnosis not present

## 2014-04-05 DIAGNOSIS — D631 Anemia in chronic kidney disease: Secondary | ICD-10-CM | POA: Diagnosis not present

## 2014-04-05 DIAGNOSIS — D509 Iron deficiency anemia, unspecified: Secondary | ICD-10-CM | POA: Diagnosis not present

## 2014-04-07 DIAGNOSIS — Z992 Dependence on renal dialysis: Secondary | ICD-10-CM | POA: Diagnosis not present

## 2014-04-07 DIAGNOSIS — D509 Iron deficiency anemia, unspecified: Secondary | ICD-10-CM | POA: Diagnosis not present

## 2014-04-07 DIAGNOSIS — N186 End stage renal disease: Secondary | ICD-10-CM | POA: Diagnosis not present

## 2014-04-07 DIAGNOSIS — N2581 Secondary hyperparathyroidism of renal origin: Secondary | ICD-10-CM | POA: Diagnosis not present

## 2014-04-07 DIAGNOSIS — D631 Anemia in chronic kidney disease: Secondary | ICD-10-CM | POA: Diagnosis not present

## 2014-04-09 DIAGNOSIS — Z992 Dependence on renal dialysis: Secondary | ICD-10-CM | POA: Diagnosis not present

## 2014-04-09 DIAGNOSIS — N186 End stage renal disease: Secondary | ICD-10-CM | POA: Diagnosis not present

## 2014-04-10 DIAGNOSIS — D509 Iron deficiency anemia, unspecified: Secondary | ICD-10-CM | POA: Diagnosis not present

## 2014-04-10 DIAGNOSIS — Z992 Dependence on renal dialysis: Secondary | ICD-10-CM | POA: Diagnosis not present

## 2014-04-10 DIAGNOSIS — N2581 Secondary hyperparathyroidism of renal origin: Secondary | ICD-10-CM | POA: Diagnosis not present

## 2014-04-10 DIAGNOSIS — N186 End stage renal disease: Secondary | ICD-10-CM | POA: Diagnosis not present

## 2014-04-10 DIAGNOSIS — D631 Anemia in chronic kidney disease: Secondary | ICD-10-CM | POA: Diagnosis not present

## 2014-04-12 ENCOUNTER — Telehealth: Payer: Self-pay | Admitting: *Deleted

## 2014-04-12 NOTE — Telephone Encounter (Signed)
Lmom to call our office for echo and doctor appt.

## 2014-04-13 DIAGNOSIS — N186 End stage renal disease: Secondary | ICD-10-CM | POA: Diagnosis not present

## 2014-04-13 DIAGNOSIS — Y841 Kidney dialysis as the cause of abnormal reaction of the patient, or of later complication, without mention of misadventure at the time of the procedure: Secondary | ICD-10-CM | POA: Diagnosis not present

## 2014-04-13 DIAGNOSIS — E785 Hyperlipidemia, unspecified: Secondary | ICD-10-CM | POA: Diagnosis not present

## 2014-04-13 DIAGNOSIS — I10 Essential (primary) hypertension: Secondary | ICD-10-CM | POA: Diagnosis not present

## 2014-04-22 ENCOUNTER — Emergency Department: Payer: Self-pay | Admitting: Emergency Medicine

## 2014-04-22 DIAGNOSIS — F419 Anxiety disorder, unspecified: Secondary | ICD-10-CM | POA: Diagnosis not present

## 2014-04-22 DIAGNOSIS — E875 Hyperkalemia: Secondary | ICD-10-CM | POA: Diagnosis not present

## 2014-04-22 DIAGNOSIS — J209 Acute bronchitis, unspecified: Secondary | ICD-10-CM | POA: Diagnosis not present

## 2014-04-22 DIAGNOSIS — Z7902 Long term (current) use of antithrombotics/antiplatelets: Secondary | ICD-10-CM | POA: Diagnosis not present

## 2014-04-22 DIAGNOSIS — E119 Type 2 diabetes mellitus without complications: Secondary | ICD-10-CM | POA: Diagnosis not present

## 2014-04-22 DIAGNOSIS — Z794 Long term (current) use of insulin: Secondary | ICD-10-CM | POA: Diagnosis not present

## 2014-04-22 DIAGNOSIS — J9809 Other diseases of bronchus, not elsewhere classified: Secondary | ICD-10-CM | POA: Diagnosis not present

## 2014-04-22 DIAGNOSIS — J441 Chronic obstructive pulmonary disease with (acute) exacerbation: Secondary | ICD-10-CM | POA: Diagnosis not present

## 2014-04-22 DIAGNOSIS — Z7982 Long term (current) use of aspirin: Secondary | ICD-10-CM | POA: Diagnosis not present

## 2014-04-22 DIAGNOSIS — R079 Chest pain, unspecified: Secondary | ICD-10-CM | POA: Diagnosis not present

## 2014-04-22 DIAGNOSIS — R51 Headache: Secondary | ICD-10-CM | POA: Diagnosis not present

## 2014-04-22 DIAGNOSIS — I10 Essential (primary) hypertension: Secondary | ICD-10-CM | POA: Diagnosis not present

## 2014-04-22 DIAGNOSIS — Z79899 Other long term (current) drug therapy: Secondary | ICD-10-CM | POA: Diagnosis not present

## 2014-04-22 DIAGNOSIS — R0602 Shortness of breath: Secondary | ICD-10-CM | POA: Diagnosis not present

## 2014-04-23 DIAGNOSIS — R0602 Shortness of breath: Secondary | ICD-10-CM | POA: Diagnosis not present

## 2014-04-23 DIAGNOSIS — J9809 Other diseases of bronchus, not elsewhere classified: Secondary | ICD-10-CM | POA: Diagnosis not present

## 2014-04-23 LAB — BASIC METABOLIC PANEL
Anion Gap: 8 (ref 7–16)
BUN: 46 mg/dL — AB (ref 7–18)
Calcium, Total: 7.8 mg/dL — ABNORMAL LOW (ref 8.5–10.1)
Chloride: 105 mmol/L (ref 98–107)
Co2: 30 mmol/L (ref 21–32)
Creatinine: 9.03 mg/dL — ABNORMAL HIGH (ref 0.60–1.30)
EGFR (African American): 6 — ABNORMAL LOW
EGFR (Non-African Amer.): 5 — ABNORMAL LOW
Glucose: 149 mg/dL — ABNORMAL HIGH (ref 65–99)
OSMOLALITY: 300 (ref 275–301)
POTASSIUM: 5.4 mmol/L — AB (ref 3.5–5.1)
SODIUM: 143 mmol/L (ref 136–145)

## 2014-04-23 LAB — POTASSIUM: POTASSIUM: 6.1 mmol/L — AB (ref 3.5–5.1)

## 2014-04-23 LAB — CBC
HCT: 40.7 % (ref 35.0–47.0)
HGB: 13.2 g/dL (ref 12.0–16.0)
MCH: 33.6 pg (ref 26.0–34.0)
MCHC: 32.5 g/dL (ref 32.0–36.0)
MCV: 103 fL — ABNORMAL HIGH (ref 80–100)
PLATELETS: 178 10*3/uL (ref 150–440)
RBC: 3.94 10*6/uL (ref 3.80–5.20)
RDW: 16.2 % — AB (ref 11.5–14.5)
WBC: 7.8 10*3/uL (ref 3.6–11.0)

## 2014-04-23 LAB — TROPONIN I
Troponin-I: 0.02 ng/mL
Troponin-I: 0.04 ng/mL

## 2014-05-09 ENCOUNTER — Ambulatory Visit: Payer: Medicare Other | Admitting: Internal Medicine

## 2014-05-09 DIAGNOSIS — Z992 Dependence on renal dialysis: Secondary | ICD-10-CM | POA: Diagnosis not present

## 2014-05-09 DIAGNOSIS — N186 End stage renal disease: Secondary | ICD-10-CM | POA: Diagnosis not present

## 2014-05-10 DIAGNOSIS — N186 End stage renal disease: Secondary | ICD-10-CM | POA: Diagnosis not present

## 2014-05-10 DIAGNOSIS — D631 Anemia in chronic kidney disease: Secondary | ICD-10-CM | POA: Diagnosis not present

## 2014-05-10 DIAGNOSIS — D509 Iron deficiency anemia, unspecified: Secondary | ICD-10-CM | POA: Diagnosis not present

## 2014-05-10 DIAGNOSIS — N2581 Secondary hyperparathyroidism of renal origin: Secondary | ICD-10-CM | POA: Diagnosis not present

## 2014-05-10 DIAGNOSIS — Z992 Dependence on renal dialysis: Secondary | ICD-10-CM | POA: Diagnosis not present

## 2014-05-12 ENCOUNTER — Inpatient Hospital Stay: Payer: Self-pay | Admitting: Internal Medicine

## 2014-05-12 DIAGNOSIS — Z8249 Family history of ischemic heart disease and other diseases of the circulatory system: Secondary | ICD-10-CM | POA: Diagnosis not present

## 2014-05-12 DIAGNOSIS — E11319 Type 2 diabetes mellitus with unspecified diabetic retinopathy without macular edema: Secondary | ICD-10-CM | POA: Diagnosis present

## 2014-05-12 DIAGNOSIS — R042 Hemoptysis: Secondary | ICD-10-CM | POA: Diagnosis not present

## 2014-05-12 DIAGNOSIS — I5032 Chronic diastolic (congestive) heart failure: Secondary | ICD-10-CM | POA: Diagnosis present

## 2014-05-12 DIAGNOSIS — E1121 Type 2 diabetes mellitus with diabetic nephropathy: Secondary | ICD-10-CM | POA: Diagnosis present

## 2014-05-12 DIAGNOSIS — Z79899 Other long term (current) drug therapy: Secondary | ICD-10-CM | POA: Diagnosis not present

## 2014-05-12 DIAGNOSIS — J449 Chronic obstructive pulmonary disease, unspecified: Secondary | ICD-10-CM | POA: Diagnosis not present

## 2014-05-12 DIAGNOSIS — I10 Essential (primary) hypertension: Secondary | ICD-10-CM | POA: Diagnosis not present

## 2014-05-12 DIAGNOSIS — Z888 Allergy status to other drugs, medicaments and biological substances status: Secondary | ICD-10-CM | POA: Diagnosis not present

## 2014-05-12 DIAGNOSIS — D631 Anemia in chronic kidney disease: Secondary | ICD-10-CM | POA: Diagnosis not present

## 2014-05-12 DIAGNOSIS — R918 Other nonspecific abnormal finding of lung field: Secondary | ICD-10-CM | POA: Diagnosis not present

## 2014-05-12 DIAGNOSIS — I2510 Atherosclerotic heart disease of native coronary artery without angina pectoris: Secondary | ICD-10-CM | POA: Diagnosis present

## 2014-05-12 DIAGNOSIS — N2581 Secondary hyperparathyroidism of renal origin: Secondary | ICD-10-CM | POA: Diagnosis not present

## 2014-05-12 DIAGNOSIS — Z992 Dependence on renal dialysis: Secondary | ICD-10-CM | POA: Diagnosis not present

## 2014-05-12 DIAGNOSIS — Z7982 Long term (current) use of aspirin: Secondary | ICD-10-CM | POA: Diagnosis not present

## 2014-05-12 DIAGNOSIS — J9600 Acute respiratory failure, unspecified whether with hypoxia or hypercapnia: Secondary | ICD-10-CM | POA: Diagnosis present

## 2014-05-12 DIAGNOSIS — E785 Hyperlipidemia, unspecified: Secondary | ICD-10-CM | POA: Diagnosis present

## 2014-05-12 DIAGNOSIS — Z800 Family history of malignant neoplasm of digestive organs: Secondary | ICD-10-CM | POA: Diagnosis not present

## 2014-05-12 DIAGNOSIS — E1140 Type 2 diabetes mellitus with diabetic neuropathy, unspecified: Secondary | ICD-10-CM | POA: Diagnosis present

## 2014-05-12 DIAGNOSIS — N186 End stage renal disease: Secondary | ICD-10-CM | POA: Diagnosis not present

## 2014-05-12 DIAGNOSIS — J189 Pneumonia, unspecified organism: Secondary | ICD-10-CM | POA: Diagnosis not present

## 2014-05-12 DIAGNOSIS — R0602 Shortness of breath: Secondary | ICD-10-CM | POA: Diagnosis not present

## 2014-05-12 DIAGNOSIS — Z90710 Acquired absence of both cervix and uterus: Secondary | ICD-10-CM | POA: Diagnosis not present

## 2014-05-12 DIAGNOSIS — K920 Hematemesis: Secondary | ICD-10-CM | POA: Diagnosis not present

## 2014-05-12 DIAGNOSIS — Z7902 Long term (current) use of antithrombotics/antiplatelets: Secondary | ICD-10-CM | POA: Diagnosis not present

## 2014-05-12 DIAGNOSIS — Z955 Presence of coronary angioplasty implant and graft: Secondary | ICD-10-CM | POA: Diagnosis not present

## 2014-05-12 DIAGNOSIS — E875 Hyperkalemia: Secondary | ICD-10-CM | POA: Diagnosis not present

## 2014-05-12 DIAGNOSIS — Z794 Long term (current) use of insulin: Secondary | ICD-10-CM | POA: Diagnosis not present

## 2014-05-12 DIAGNOSIS — I120 Hypertensive chronic kidney disease with stage 5 chronic kidney disease or end stage renal disease: Secondary | ICD-10-CM | POA: Diagnosis present

## 2014-05-12 DIAGNOSIS — M6281 Muscle weakness (generalized): Secondary | ICD-10-CM | POA: Diagnosis not present

## 2014-05-12 DIAGNOSIS — J9601 Acute respiratory failure with hypoxia: Secondary | ICD-10-CM | POA: Diagnosis not present

## 2014-05-12 LAB — BASIC METABOLIC PANEL
Anion Gap: 8 (ref 7–16)
BUN: 57 mg/dL — AB (ref 7–18)
CALCIUM: 9.5 mg/dL (ref 8.5–10.1)
Chloride: 102 mmol/L (ref 98–107)
Co2: 30 mmol/L (ref 21–32)
Creatinine: 10.59 mg/dL — ABNORMAL HIGH (ref 0.60–1.30)
GFR CALC AF AMER: 5 — AB
GFR CALC NON AF AMER: 4 — AB
GLUCOSE: 108 mg/dL — AB (ref 65–99)
OSMOLALITY: 296 (ref 275–301)
Potassium: 6.4 mmol/L — ABNORMAL HIGH (ref 3.5–5.1)
SODIUM: 140 mmol/L (ref 136–145)

## 2014-05-12 LAB — CBC
HCT: 38.9 % (ref 35.0–47.0)
HGB: 12.2 g/dL (ref 12.0–16.0)
MCH: 31.8 pg (ref 26.0–34.0)
MCHC: 31.4 g/dL — ABNORMAL LOW (ref 32.0–36.0)
MCV: 102 fL — AB (ref 80–100)
Platelet: 133 10*3/uL — ABNORMAL LOW (ref 150–440)
RBC: 3.83 10*6/uL (ref 3.80–5.20)
RDW: 16 % — ABNORMAL HIGH (ref 11.5–14.5)
WBC: 9.6 10*3/uL (ref 3.6–11.0)

## 2014-05-12 LAB — TROPONIN I: Troponin-I: 0.03 ng/mL

## 2014-05-12 LAB — PHOSPHORUS: PHOSPHORUS: 4.6 mg/dL (ref 2.5–4.9)

## 2014-05-13 DIAGNOSIS — D631 Anemia in chronic kidney disease: Secondary | ICD-10-CM | POA: Diagnosis not present

## 2014-05-13 DIAGNOSIS — Z992 Dependence on renal dialysis: Secondary | ICD-10-CM | POA: Diagnosis not present

## 2014-05-13 DIAGNOSIS — E875 Hyperkalemia: Secondary | ICD-10-CM | POA: Diagnosis not present

## 2014-05-13 DIAGNOSIS — N2581 Secondary hyperparathyroidism of renal origin: Secondary | ICD-10-CM | POA: Diagnosis not present

## 2014-05-13 DIAGNOSIS — J9601 Acute respiratory failure with hypoxia: Secondary | ICD-10-CM | POA: Diagnosis not present

## 2014-05-13 DIAGNOSIS — J189 Pneumonia, unspecified organism: Secondary | ICD-10-CM | POA: Diagnosis not present

## 2014-05-13 DIAGNOSIS — I10 Essential (primary) hypertension: Secondary | ICD-10-CM | POA: Diagnosis not present

## 2014-05-13 DIAGNOSIS — N186 End stage renal disease: Secondary | ICD-10-CM | POA: Diagnosis not present

## 2014-05-13 LAB — BASIC METABOLIC PANEL
Anion Gap: 9 (ref 7–16)
BUN: 41 mg/dL — AB (ref 7–18)
CALCIUM: 8.4 mg/dL — AB (ref 8.5–10.1)
CO2: 32 mmol/L (ref 21–32)
Chloride: 98 mmol/L (ref 98–107)
Creatinine: 7.43 mg/dL — ABNORMAL HIGH (ref 0.60–1.30)
EGFR (African American): 7 — ABNORMAL LOW
EGFR (Non-African Amer.): 6 — ABNORMAL LOW
Glucose: 69 mg/dL (ref 65–99)
OSMOLALITY: 286 (ref 275–301)
POTASSIUM: 6 mmol/L — AB (ref 3.5–5.1)
Sodium: 139 mmol/L (ref 136–145)

## 2014-05-13 LAB — PHOSPHORUS: Phosphorus: 4.2 mg/dL (ref 2.5–4.9)

## 2014-05-14 DIAGNOSIS — J189 Pneumonia, unspecified organism: Secondary | ICD-10-CM | POA: Diagnosis not present

## 2014-05-14 DIAGNOSIS — Z992 Dependence on renal dialysis: Secondary | ICD-10-CM | POA: Diagnosis not present

## 2014-05-14 DIAGNOSIS — E875 Hyperkalemia: Secondary | ICD-10-CM | POA: Diagnosis not present

## 2014-05-14 DIAGNOSIS — N186 End stage renal disease: Secondary | ICD-10-CM | POA: Diagnosis not present

## 2014-05-14 DIAGNOSIS — R042 Hemoptysis: Secondary | ICD-10-CM | POA: Diagnosis not present

## 2014-05-14 DIAGNOSIS — N2581 Secondary hyperparathyroidism of renal origin: Secondary | ICD-10-CM | POA: Diagnosis not present

## 2014-05-14 DIAGNOSIS — D631 Anemia in chronic kidney disease: Secondary | ICD-10-CM | POA: Diagnosis not present

## 2014-05-14 DIAGNOSIS — J9601 Acute respiratory failure with hypoxia: Secondary | ICD-10-CM | POA: Diagnosis not present

## 2014-05-14 LAB — BASIC METABOLIC PANEL
ANION GAP: 4 — AB (ref 7–16)
BUN: 25 mg/dL — AB (ref 7–18)
Calcium, Total: 8.9 mg/dL (ref 8.5–10.1)
Chloride: 98 mmol/L (ref 98–107)
Co2: 35 mmol/L — ABNORMAL HIGH (ref 21–32)
Creatinine: 5.83 mg/dL — ABNORMAL HIGH (ref 0.60–1.30)
EGFR (Non-African Amer.): 8 — ABNORMAL LOW
GFR CALC AF AMER: 10 — AB
GLUCOSE: 96 mg/dL (ref 65–99)
Osmolality: 278 (ref 275–301)
Potassium: 5.6 mmol/L — ABNORMAL HIGH (ref 3.5–5.1)
Sodium: 137 mmol/L (ref 136–145)

## 2014-05-14 LAB — PHOSPHORUS: Phosphorus: 3.2 mg/dL (ref 2.5–4.9)

## 2014-05-14 LAB — POTASSIUM: POTASSIUM: 3.3 mmol/L — AB (ref 3.5–5.1)

## 2014-05-18 ENCOUNTER — Encounter (HOSPITAL_COMMUNITY): Payer: Self-pay | Admitting: Interventional Cardiology

## 2014-05-25 ENCOUNTER — Ambulatory Visit: Payer: Medicare Other | Admitting: Podiatry

## 2014-05-28 HISTORY — PX: CORONARY ANGIOPLASTY: SHX604

## 2014-06-09 DIAGNOSIS — N186 End stage renal disease: Secondary | ICD-10-CM | POA: Diagnosis not present

## 2014-06-09 DIAGNOSIS — Z992 Dependence on renal dialysis: Secondary | ICD-10-CM | POA: Diagnosis not present

## 2014-06-09 DIAGNOSIS — Z418 Encounter for other procedures for purposes other than remedying health state: Secondary | ICD-10-CM | POA: Diagnosis not present

## 2014-06-09 DIAGNOSIS — N2581 Secondary hyperparathyroidism of renal origin: Secondary | ICD-10-CM | POA: Diagnosis not present

## 2014-06-09 DIAGNOSIS — D509 Iron deficiency anemia, unspecified: Secondary | ICD-10-CM | POA: Diagnosis not present

## 2014-06-09 DIAGNOSIS — D631 Anemia in chronic kidney disease: Secondary | ICD-10-CM | POA: Diagnosis not present

## 2014-06-12 DIAGNOSIS — N2581 Secondary hyperparathyroidism of renal origin: Secondary | ICD-10-CM | POA: Diagnosis not present

## 2014-06-12 DIAGNOSIS — Z418 Encounter for other procedures for purposes other than remedying health state: Secondary | ICD-10-CM | POA: Diagnosis not present

## 2014-06-12 DIAGNOSIS — N186 End stage renal disease: Secondary | ICD-10-CM | POA: Diagnosis not present

## 2014-06-12 DIAGNOSIS — Z992 Dependence on renal dialysis: Secondary | ICD-10-CM | POA: Diagnosis not present

## 2014-06-12 DIAGNOSIS — D631 Anemia in chronic kidney disease: Secondary | ICD-10-CM | POA: Diagnosis not present

## 2014-06-12 DIAGNOSIS — D509 Iron deficiency anemia, unspecified: Secondary | ICD-10-CM | POA: Diagnosis not present

## 2014-06-13 ENCOUNTER — Ambulatory Visit (INDEPENDENT_AMBULATORY_CARE_PROVIDER_SITE_OTHER): Payer: Medicare Other | Admitting: Internal Medicine

## 2014-06-13 ENCOUNTER — Encounter: Payer: Self-pay | Admitting: Internal Medicine

## 2014-06-13 VITALS — BP 193/76 | HR 71 | Temp 98.2°F | Ht 67.5 in | Wt 171.0 lb

## 2014-06-13 DIAGNOSIS — E118 Type 2 diabetes mellitus with unspecified complications: Secondary | ICD-10-CM

## 2014-06-13 DIAGNOSIS — I2510 Atherosclerotic heart disease of native coronary artery without angina pectoris: Secondary | ICD-10-CM

## 2014-06-13 DIAGNOSIS — I10 Essential (primary) hypertension: Secondary | ICD-10-CM

## 2014-06-13 DIAGNOSIS — M25551 Pain in right hip: Secondary | ICD-10-CM | POA: Diagnosis not present

## 2014-06-13 LAB — HEMOGLOBIN A1C: Hgb A1c MFr Bld: 5.6 % (ref 4.6–6.5)

## 2014-06-13 LAB — LIPID PANEL
CHOL/HDL RATIO: 2
Cholesterol: 164 mg/dL (ref 0–200)
HDL: 68.9 mg/dL (ref >39.00)
LDL CALC: 79 mg/dL (ref 0–99)
NONHDL: 95.1
Triglycerides: 81 mg/dL (ref 0.0–149.0)
VLDL: 16.2 mg/dL (ref 0.0–40.0)

## 2014-06-13 MED ORDER — OXYCODONE-ACETAMINOPHEN 5-325 MG PO TABS: 1.0000 | ORAL_TABLET | Freq: Three times a day (TID) | ORAL | Status: DC | PRN

## 2014-06-13 NOTE — Assessment & Plan Note (Signed)
Symptomatically, doing well. Continues on Plavix, Aspirin, Crestor. Follow up with Dr. Rockey Situ pending for next month.

## 2014-06-13 NOTE — Assessment & Plan Note (Signed)
BP Readings from Last 3 Encounters:  06/13/14 193/76  03/28/14 162/60  02/21/14 140/52   BP elevated today, however had hypotension last night with HD and session had to be cut short. She has not taken BP meds yet today. She will start back on medication and monitor BP this afternoon. Recheck BP with HD tomorrow.

## 2014-06-13 NOTE — Assessment & Plan Note (Signed)
Right hip pain persistent. Will continue Lyrica, prn Percocet. Reviewed Dr. Lillie Fragmin notes. Will set up follow up with him. Given she has ESRD on HD, medication options are limited. She may be candidate for steroid injection in the future, however this has been precluded by her use of Plavix after NSTEMI.

## 2014-06-13 NOTE — Assessment & Plan Note (Signed)
Will check A1c with labs today. Continue Levemir 5units daily.

## 2014-06-13 NOTE — Progress Notes (Signed)
Subjective:    Patient ID: Theresa [name], female    DOB: Sep 18, 1955, 59 y.o.   MRN: [id number]  HPI 59YO female presents for follow up.  HD yesterday, developed hypotension and had to stop session. Has not taken BP meds yet this morning.  Continues to have right hip pain and low back pain. Using Lyrica 75mg  daily, Ativan at night, and Percocet 1/4 tablet as directed by Dr. Lorelei Pont with minimal improvement. Unable to afford Lidocaine patch and insurance will not cover. Has drowsiness and constipation with percocet. Follow up scheduled with Dr. Lorelei Pont next month.  Has follow up with Cardiology 1/14. No recent chest pain, dyspnea.  Hospitalized for elevated potassium in early December. Improved with HD and eliminating excess K in diet.  DM - BG have been near 100. No low BG. Compliant with insulin.   Past medical, surgical, family and social history per today's encounter.  Review of Systems  Constitutional: Negative for fever, chills, appetite change, fatigue and unexpected weight change.  Eyes: Negative for visual disturbance.  Respiratory: Negative for shortness of breath.   Cardiovascular: Negative for chest pain and leg swelling.  Gastrointestinal: Positive for constipation. Negative for nausea, vomiting, abdominal pain and diarrhea.  Musculoskeletal: Positive for myalgias, back pain and arthralgias.  Skin: Negative for color change and rash.  Hematological: Negative for adenopathy. Does not bruise/bleed easily.  Psychiatric/Behavioral: Negative for dysphoric mood. The patient is not nervous/anxious.        Objective:    BP 193/76 mmHg  Pulse 71  Temp(Src) 98.2 F (36.8 C) (Oral)  Ht 5' 7.5" (1.715 m)  Wt 171 lb (77.565 kg)  BMI 26.37 kg/m2  SpO2 99% Physical Exam  Constitutional: She is oriented to person, place, and time. She appears well-developed and well-nourished. No distress.  HENT:  Head: Normocephalic and atraumatic.  Right Ear: External ear normal.    Left Ear: External ear normal.  Nose: Nose normal.  Mouth/Throat: Oropharynx is clear and moist. No oropharyngeal exudate.  Eyes: Conjunctivae are normal. Pupils are equal, round, and reactive to light. Right eye exhibits no discharge. Left eye exhibits no discharge. No scleral icterus.  Neck: Normal range of motion. Neck supple. No tracheal deviation present. No thyromegaly present.  Cardiovascular: Normal rate, regular rhythm, normal heart sounds and intact distal pulses.  Exam reveals no gallop and no friction rub.   No murmur heard. Pulmonary/Chest: Effort normal and breath sounds normal. No accessory muscle usage. No tachypnea. No respiratory distress. She has no decreased breath sounds. She has no wheezes. She has no rhonchi. She has no rales. She exhibits no tenderness.  Musculoskeletal: Normal range of motion. She exhibits no edema or tenderness.  Lymphadenopathy:    She has no cervical adenopathy.  Neurological: She is alert and oriented to person, place, and time. No cranial nerve deficit. She exhibits normal muscle tone. Coordination normal.  Skin: Skin is warm and dry. No rash noted. She is not diaphoretic. No erythema. No pallor.  Psychiatric: She has a normal mood and affect. Her behavior is normal. Judgment and thought content normal.          Assessment & Plan:   Problem List Items Addressed This Visit      Unprioritized   Coronary artery disease    Symptomatically, doing well. Continues on Plavix, Aspirin, Crestor. Follow up with Dr. Rockey Situ pending for next month.    Diabetes mellitus, type II    Will check A1c with labs today. Continue Levemir  5units daily.    Relevant Orders      Hemoglobin A1c      Lipid panel   Essential (primary) hypertension - Primary    BP Readings from Last 3 Encounters:  06/13/14 193/76  03/28/14 162/60  02/21/14 140/52   BP elevated today, however had hypotension last night with HD and session had to be cut short. She has not taken  BP meds yet today. She will start back on medication and monitor BP this afternoon. Recheck BP with HD tomorrow.    Right hip pain    Right hip pain persistent. Will continue Lyrica, prn Percocet. Reviewed Dr. Lillie Fragmin notes. Will set up follow up with him. Given she has ESRD on HD, medication options are limited. She may be candidate for steroid injection in the future, however this has been precluded by her use of Plavix after NSTEMI.    Relevant Medications      oxyCODONE-acetaminophen (ROXICET) 5-325 MG per tablet       Return in about 3 months (around 09/12/2014) for Recheck of Diabetes.

## 2014-06-13 NOTE — Progress Notes (Signed)
Pre visit review using our clinic review tool, if applicable. No additional management support is needed unless otherwise documented below in the visit note. 

## 2014-06-13 NOTE — Patient Instructions (Signed)
Labs today.  We will schedule follow up with Dr. Lorelei Pont.  Follow up here in 3 months.

## 2014-06-14 DIAGNOSIS — D509 Iron deficiency anemia, unspecified: Secondary | ICD-10-CM | POA: Diagnosis not present

## 2014-06-14 DIAGNOSIS — Z418 Encounter for other procedures for purposes other than remedying health state: Secondary | ICD-10-CM | POA: Diagnosis not present

## 2014-06-14 DIAGNOSIS — Z992 Dependence on renal dialysis: Secondary | ICD-10-CM | POA: Diagnosis not present

## 2014-06-14 DIAGNOSIS — N186 End stage renal disease: Secondary | ICD-10-CM | POA: Diagnosis not present

## 2014-06-14 DIAGNOSIS — N2581 Secondary hyperparathyroidism of renal origin: Secondary | ICD-10-CM | POA: Diagnosis not present

## 2014-06-14 DIAGNOSIS — D631 Anemia in chronic kidney disease: Secondary | ICD-10-CM | POA: Diagnosis not present

## 2014-06-16 DIAGNOSIS — Z992 Dependence on renal dialysis: Secondary | ICD-10-CM | POA: Diagnosis not present

## 2014-06-16 DIAGNOSIS — N186 End stage renal disease: Secondary | ICD-10-CM | POA: Diagnosis not present

## 2014-06-16 DIAGNOSIS — Z418 Encounter for other procedures for purposes other than remedying health state: Secondary | ICD-10-CM | POA: Diagnosis not present

## 2014-06-16 DIAGNOSIS — N2581 Secondary hyperparathyroidism of renal origin: Secondary | ICD-10-CM | POA: Diagnosis not present

## 2014-06-16 DIAGNOSIS — D509 Iron deficiency anemia, unspecified: Secondary | ICD-10-CM | POA: Diagnosis not present

## 2014-06-16 DIAGNOSIS — D631 Anemia in chronic kidney disease: Secondary | ICD-10-CM | POA: Diagnosis not present

## 2014-06-19 DIAGNOSIS — D509 Iron deficiency anemia, unspecified: Secondary | ICD-10-CM | POA: Diagnosis not present

## 2014-06-19 DIAGNOSIS — Z992 Dependence on renal dialysis: Secondary | ICD-10-CM | POA: Diagnosis not present

## 2014-06-19 DIAGNOSIS — E1122 Type 2 diabetes mellitus with diabetic chronic kidney disease: Secondary | ICD-10-CM | POA: Diagnosis not present

## 2014-06-19 DIAGNOSIS — D631 Anemia in chronic kidney disease: Secondary | ICD-10-CM | POA: Diagnosis not present

## 2014-06-19 DIAGNOSIS — Z418 Encounter for other procedures for purposes other than remedying health state: Secondary | ICD-10-CM | POA: Diagnosis not present

## 2014-06-19 DIAGNOSIS — N186 End stage renal disease: Secondary | ICD-10-CM | POA: Diagnosis not present

## 2014-06-19 DIAGNOSIS — N2581 Secondary hyperparathyroidism of renal origin: Secondary | ICD-10-CM | POA: Diagnosis not present

## 2014-06-21 DIAGNOSIS — N2581 Secondary hyperparathyroidism of renal origin: Secondary | ICD-10-CM | POA: Diagnosis not present

## 2014-06-21 DIAGNOSIS — D631 Anemia in chronic kidney disease: Secondary | ICD-10-CM | POA: Diagnosis not present

## 2014-06-21 DIAGNOSIS — N186 End stage renal disease: Secondary | ICD-10-CM | POA: Diagnosis not present

## 2014-06-21 DIAGNOSIS — Z992 Dependence on renal dialysis: Secondary | ICD-10-CM | POA: Diagnosis not present

## 2014-06-21 DIAGNOSIS — Z418 Encounter for other procedures for purposes other than remedying health state: Secondary | ICD-10-CM | POA: Diagnosis not present

## 2014-06-21 DIAGNOSIS — D509 Iron deficiency anemia, unspecified: Secondary | ICD-10-CM | POA: Diagnosis not present

## 2014-06-22 NOTE — Unmapped (Signed)
Urine specimen collected.

## 2014-06-23 DIAGNOSIS — Z992 Dependence on renal dialysis: Secondary | ICD-10-CM | POA: Diagnosis not present

## 2014-06-23 DIAGNOSIS — N2581 Secondary hyperparathyroidism of renal origin: Secondary | ICD-10-CM | POA: Diagnosis not present

## 2014-06-23 DIAGNOSIS — D631 Anemia in chronic kidney disease: Secondary | ICD-10-CM | POA: Diagnosis not present

## 2014-06-23 DIAGNOSIS — D509 Iron deficiency anemia, unspecified: Secondary | ICD-10-CM | POA: Diagnosis not present

## 2014-06-23 DIAGNOSIS — N186 End stage renal disease: Secondary | ICD-10-CM | POA: Diagnosis not present

## 2014-06-23 DIAGNOSIS — Z418 Encounter for other procedures for purposes other than remedying health state: Secondary | ICD-10-CM | POA: Diagnosis not present

## 2014-06-26 DIAGNOSIS — N186 End stage renal disease: Secondary | ICD-10-CM | POA: Diagnosis not present

## 2014-06-26 DIAGNOSIS — Z992 Dependence on renal dialysis: Secondary | ICD-10-CM | POA: Diagnosis not present

## 2014-06-26 DIAGNOSIS — D509 Iron deficiency anemia, unspecified: Secondary | ICD-10-CM | POA: Diagnosis not present

## 2014-06-26 DIAGNOSIS — D631 Anemia in chronic kidney disease: Secondary | ICD-10-CM | POA: Diagnosis not present

## 2014-06-26 DIAGNOSIS — Z418 Encounter for other procedures for purposes other than remedying health state: Secondary | ICD-10-CM | POA: Diagnosis not present

## 2014-06-26 DIAGNOSIS — N2581 Secondary hyperparathyroidism of renal origin: Secondary | ICD-10-CM | POA: Diagnosis not present

## 2014-06-28 DIAGNOSIS — Z992 Dependence on renal dialysis: Secondary | ICD-10-CM | POA: Diagnosis not present

## 2014-06-28 DIAGNOSIS — D631 Anemia in chronic kidney disease: Secondary | ICD-10-CM | POA: Diagnosis not present

## 2014-06-28 DIAGNOSIS — N186 End stage renal disease: Secondary | ICD-10-CM | POA: Diagnosis not present

## 2014-06-28 DIAGNOSIS — N2581 Secondary hyperparathyroidism of renal origin: Secondary | ICD-10-CM | POA: Diagnosis not present

## 2014-06-28 DIAGNOSIS — Z418 Encounter for other procedures for purposes other than remedying health state: Secondary | ICD-10-CM | POA: Diagnosis not present

## 2014-06-28 DIAGNOSIS — D509 Iron deficiency anemia, unspecified: Secondary | ICD-10-CM | POA: Diagnosis not present

## 2014-06-30 DIAGNOSIS — D631 Anemia in chronic kidney disease: Secondary | ICD-10-CM | POA: Diagnosis not present

## 2014-06-30 DIAGNOSIS — Z418 Encounter for other procedures for purposes other than remedying health state: Secondary | ICD-10-CM | POA: Diagnosis not present

## 2014-06-30 DIAGNOSIS — D509 Iron deficiency anemia, unspecified: Secondary | ICD-10-CM | POA: Diagnosis not present

## 2014-06-30 DIAGNOSIS — N2581 Secondary hyperparathyroidism of renal origin: Secondary | ICD-10-CM | POA: Diagnosis not present

## 2014-06-30 DIAGNOSIS — Z992 Dependence on renal dialysis: Secondary | ICD-10-CM | POA: Diagnosis not present

## 2014-06-30 DIAGNOSIS — N186 End stage renal disease: Secondary | ICD-10-CM | POA: Diagnosis not present

## 2014-07-03 DIAGNOSIS — N2581 Secondary hyperparathyroidism of renal origin: Secondary | ICD-10-CM | POA: Diagnosis not present

## 2014-07-03 DIAGNOSIS — N186 End stage renal disease: Secondary | ICD-10-CM | POA: Diagnosis not present

## 2014-07-03 DIAGNOSIS — D509 Iron deficiency anemia, unspecified: Secondary | ICD-10-CM | POA: Diagnosis not present

## 2014-07-03 DIAGNOSIS — Z418 Encounter for other procedures for purposes other than remedying health state: Secondary | ICD-10-CM | POA: Diagnosis not present

## 2014-07-03 DIAGNOSIS — Z992 Dependence on renal dialysis: Secondary | ICD-10-CM | POA: Diagnosis not present

## 2014-07-03 DIAGNOSIS — D631 Anemia in chronic kidney disease: Secondary | ICD-10-CM | POA: Diagnosis not present

## 2014-07-05 DIAGNOSIS — Z418 Encounter for other procedures for purposes other than remedying health state: Secondary | ICD-10-CM | POA: Diagnosis not present

## 2014-07-05 DIAGNOSIS — N186 End stage renal disease: Secondary | ICD-10-CM | POA: Diagnosis not present

## 2014-07-05 DIAGNOSIS — D509 Iron deficiency anemia, unspecified: Secondary | ICD-10-CM | POA: Diagnosis not present

## 2014-07-05 DIAGNOSIS — N2581 Secondary hyperparathyroidism of renal origin: Secondary | ICD-10-CM | POA: Diagnosis not present

## 2014-07-05 DIAGNOSIS — Z992 Dependence on renal dialysis: Secondary | ICD-10-CM | POA: Diagnosis not present

## 2014-07-05 DIAGNOSIS — D631 Anemia in chronic kidney disease: Secondary | ICD-10-CM | POA: Diagnosis not present

## 2014-07-07 DIAGNOSIS — N2581 Secondary hyperparathyroidism of renal origin: Secondary | ICD-10-CM | POA: Diagnosis not present

## 2014-07-07 DIAGNOSIS — Z992 Dependence on renal dialysis: Secondary | ICD-10-CM | POA: Diagnosis not present

## 2014-07-07 DIAGNOSIS — D631 Anemia in chronic kidney disease: Secondary | ICD-10-CM | POA: Diagnosis not present

## 2014-07-07 DIAGNOSIS — Z418 Encounter for other procedures for purposes other than remedying health state: Secondary | ICD-10-CM | POA: Diagnosis not present

## 2014-07-07 DIAGNOSIS — D509 Iron deficiency anemia, unspecified: Secondary | ICD-10-CM | POA: Diagnosis not present

## 2014-07-07 DIAGNOSIS — N186 End stage renal disease: Secondary | ICD-10-CM | POA: Diagnosis not present

## 2014-07-10 DIAGNOSIS — N186 End stage renal disease: Secondary | ICD-10-CM | POA: Diagnosis not present

## 2014-07-10 DIAGNOSIS — N2581 Secondary hyperparathyroidism of renal origin: Secondary | ICD-10-CM | POA: Diagnosis not present

## 2014-07-10 DIAGNOSIS — Z992 Dependence on renal dialysis: Secondary | ICD-10-CM | POA: Diagnosis not present

## 2014-07-10 DIAGNOSIS — D509 Iron deficiency anemia, unspecified: Secondary | ICD-10-CM | POA: Diagnosis not present

## 2014-07-10 DIAGNOSIS — D631 Anemia in chronic kidney disease: Secondary | ICD-10-CM | POA: Diagnosis not present

## 2014-07-12 DIAGNOSIS — Z992 Dependence on renal dialysis: Secondary | ICD-10-CM | POA: Diagnosis not present

## 2014-07-12 DIAGNOSIS — N2581 Secondary hyperparathyroidism of renal origin: Secondary | ICD-10-CM | POA: Diagnosis not present

## 2014-07-12 DIAGNOSIS — N186 End stage renal disease: Secondary | ICD-10-CM | POA: Diagnosis not present

## 2014-07-12 DIAGNOSIS — D631 Anemia in chronic kidney disease: Secondary | ICD-10-CM | POA: Diagnosis not present

## 2014-07-12 DIAGNOSIS — D509 Iron deficiency anemia, unspecified: Secondary | ICD-10-CM | POA: Diagnosis not present

## 2014-07-14 DIAGNOSIS — N186 End stage renal disease: Secondary | ICD-10-CM | POA: Diagnosis not present

## 2014-07-14 DIAGNOSIS — D631 Anemia in chronic kidney disease: Secondary | ICD-10-CM | POA: Diagnosis not present

## 2014-07-14 DIAGNOSIS — N2581 Secondary hyperparathyroidism of renal origin: Secondary | ICD-10-CM | POA: Diagnosis not present

## 2014-07-14 DIAGNOSIS — Z992 Dependence on renal dialysis: Secondary | ICD-10-CM | POA: Diagnosis not present

## 2014-07-14 DIAGNOSIS — D509 Iron deficiency anemia, unspecified: Secondary | ICD-10-CM | POA: Diagnosis not present

## 2014-07-17 DIAGNOSIS — Z992 Dependence on renal dialysis: Secondary | ICD-10-CM | POA: Diagnosis not present

## 2014-07-17 DIAGNOSIS — D631 Anemia in chronic kidney disease: Secondary | ICD-10-CM | POA: Diagnosis not present

## 2014-07-17 DIAGNOSIS — N2581 Secondary hyperparathyroidism of renal origin: Secondary | ICD-10-CM | POA: Diagnosis not present

## 2014-07-17 DIAGNOSIS — N186 End stage renal disease: Secondary | ICD-10-CM | POA: Diagnosis not present

## 2014-07-17 DIAGNOSIS — D509 Iron deficiency anemia, unspecified: Secondary | ICD-10-CM | POA: Diagnosis not present

## 2014-07-19 DIAGNOSIS — D631 Anemia in chronic kidney disease: Secondary | ICD-10-CM | POA: Diagnosis not present

## 2014-07-19 DIAGNOSIS — D509 Iron deficiency anemia, unspecified: Secondary | ICD-10-CM | POA: Diagnosis not present

## 2014-07-19 DIAGNOSIS — Z992 Dependence on renal dialysis: Secondary | ICD-10-CM | POA: Diagnosis not present

## 2014-07-19 DIAGNOSIS — N186 End stage renal disease: Secondary | ICD-10-CM | POA: Diagnosis not present

## 2014-07-19 DIAGNOSIS — N2581 Secondary hyperparathyroidism of renal origin: Secondary | ICD-10-CM | POA: Diagnosis not present

## 2014-07-20 DIAGNOSIS — Z992 Dependence on renal dialysis: Secondary | ICD-10-CM | POA: Diagnosis not present

## 2014-07-20 DIAGNOSIS — Y841 Kidney dialysis as the cause of abnormal reaction of the patient, or of later complication, without mention of misadventure at the time of the procedure: Secondary | ICD-10-CM | POA: Diagnosis not present

## 2014-07-20 DIAGNOSIS — N186 End stage renal disease: Secondary | ICD-10-CM | POA: Diagnosis not present

## 2014-07-20 DIAGNOSIS — I10 Essential (primary) hypertension: Secondary | ICD-10-CM | POA: Diagnosis not present

## 2014-07-20 DIAGNOSIS — E785 Hyperlipidemia, unspecified: Secondary | ICD-10-CM | POA: Diagnosis not present

## 2014-07-21 DIAGNOSIS — D509 Iron deficiency anemia, unspecified: Secondary | ICD-10-CM | POA: Diagnosis not present

## 2014-07-21 DIAGNOSIS — N2581 Secondary hyperparathyroidism of renal origin: Secondary | ICD-10-CM | POA: Diagnosis not present

## 2014-07-21 DIAGNOSIS — N186 End stage renal disease: Secondary | ICD-10-CM | POA: Diagnosis not present

## 2014-07-21 DIAGNOSIS — D631 Anemia in chronic kidney disease: Secondary | ICD-10-CM | POA: Diagnosis not present

## 2014-07-21 DIAGNOSIS — Z992 Dependence on renal dialysis: Secondary | ICD-10-CM | POA: Diagnosis not present

## 2014-07-24 DIAGNOSIS — N2581 Secondary hyperparathyroidism of renal origin: Secondary | ICD-10-CM | POA: Diagnosis not present

## 2014-07-24 DIAGNOSIS — D631 Anemia in chronic kidney disease: Secondary | ICD-10-CM | POA: Diagnosis not present

## 2014-07-24 DIAGNOSIS — N186 End stage renal disease: Secondary | ICD-10-CM | POA: Diagnosis not present

## 2014-07-24 DIAGNOSIS — D509 Iron deficiency anemia, unspecified: Secondary | ICD-10-CM | POA: Diagnosis not present

## 2014-07-24 DIAGNOSIS — Z992 Dependence on renal dialysis: Secondary | ICD-10-CM | POA: Diagnosis not present

## 2014-07-26 ENCOUNTER — Encounter: Payer: Self-pay | Admitting: Cardiovascular Disease

## 2014-07-26 ENCOUNTER — Ambulatory Visit (INDEPENDENT_AMBULATORY_CARE_PROVIDER_SITE_OTHER): Payer: Medicare Other | Admitting: Cardiovascular Disease

## 2014-07-26 VITALS — BP 158/70 | HR 72 | Ht 67.5 in | Wt 171.8 lb

## 2014-07-26 DIAGNOSIS — I2510 Atherosclerotic heart disease of native coronary artery without angina pectoris: Secondary | ICD-10-CM

## 2014-07-26 DIAGNOSIS — E785 Hyperlipidemia, unspecified: Secondary | ICD-10-CM

## 2014-07-26 DIAGNOSIS — Z992 Dependence on renal dialysis: Secondary | ICD-10-CM | POA: Diagnosis not present

## 2014-07-26 DIAGNOSIS — N2581 Secondary hyperparathyroidism of renal origin: Secondary | ICD-10-CM | POA: Diagnosis not present

## 2014-07-26 DIAGNOSIS — N186 End stage renal disease: Secondary | ICD-10-CM | POA: Diagnosis not present

## 2014-07-26 DIAGNOSIS — E118 Type 2 diabetes mellitus with unspecified complications: Secondary | ICD-10-CM | POA: Diagnosis not present

## 2014-07-26 DIAGNOSIS — I10 Essential (primary) hypertension: Secondary | ICD-10-CM

## 2014-07-26 DIAGNOSIS — D631 Anemia in chronic kidney disease: Secondary | ICD-10-CM | POA: Diagnosis not present

## 2014-07-26 DIAGNOSIS — D509 Iron deficiency anemia, unspecified: Secondary | ICD-10-CM | POA: Diagnosis not present

## 2014-07-26 NOTE — Assessment & Plan Note (Signed)
Cholesterol has climbed since last year. Likely from weight gain. Recommended she watch her diet, stay on her statin

## 2014-07-26 NOTE — Assessment & Plan Note (Signed)
We have encouraged continued exercise, careful diet management in an effort to lose weight. 

## 2014-07-26 NOTE — Progress Notes (Signed)
Patient ID: Theresa [name], female    DOB: 16-Aug-1955, 59 y.o.   MRN: [id number]  HPI Comments: Theresa [name] is a pleasant 21 -year old woman with 25 years of smoking, diabetes, chronic renal insufficiency,  previously noted to have moderate to severe mitral valve regurgitation on prior echocardiogram (improved to moderate MR on recent echo), pneumonia in June of 2012 with admission overnight to [facility] who initially presented by referral from Dr. Candiss Norse (renal) for evaluation of her ejection fraction which is 45% and mitral valve disease.  on dialysis since February 2014, 3 days per week. She has 40-59% bilateral carotid arterial disease as of September 2014  Drug-eluting stent placed December 2014 to the mid RCA  In followup today she reports that she is doing well. She denies any significant shortness of breath or chest pain. She states that her weight is up  Recent evaluation in the hospital  05/12/2014 for fluid overload, hyperkalemia, shortness of breath, saturations 89% on room air, systolic blood pressure 458. She had dialysis with improvement of her symptoms. She does hemodialysis on Monday, Wednesday, Friday She was told that her potassium intake was too high. Since then she's had no further problems.  EKG shows normal sinus rhythm with rate 72 beats per minute with no significant ST or T wave changes   Other past medical history Previously he would wake up with tachycardia.  sits up and symptoms typically resolve.   hospital admission for pneumonia 05/28/2013, elevated cardiac enzymes, transferred to [place] for cardiac catheterization. She had stent placed for 95% mid RCA lesion. She had 2.5 x 16 mm Promus stent placed, post dilated up to 2.78 mm . Since then she reports she is doing well with no further chest pain or shortness of breath . She is on the renal transplant list but reports this will require her to be on the list for 5 years before transplant eligibility   Readmission to  the hospital 06/14/2013 for C. difficile colitis. Discharge on 06/16/2013 C. difficile was indigent positive, toxin negative. She was treated with Flagyl for 10 days  Interestingly Stress test at [facility] August 2014 was essentially normal with normal ejection fraction (there was mention of very small mild region of artifact) Echocardiogram at [facility] July 2014 showed ejection fraction greater than 55%, mild MR  She sees Dr. Candiss Norse  of [place] [facility] .  previous  AV graft on 02/23/2013. Graft was thrombosed. Status post revision   Echocardiogram on November 10, 2010 shows mildly dilated left ventricle, ejection fraction 45-50%, mild MS, moderate to severe mitral regurg, mild to moderate tricuspid regurg.     Allergies  Allergen Reactions  . Hydrocodone   . Metformin     Other reaction(s): Distress (finding)    Outpatient Encounter Prescriptions as of 07/26/2014  Medication Sig  . albuterol (PROAIR HFA) 108 (90 BASE) MCG/ACT inhaler Inhale 1 puff into the lungs every 4 (four) hours as needed.   . Alcohol Swabs PADS Patient uses at home to assist with testing blood sugars twice daily.  Marland Kitchen aspirin EC 81 MG tablet Take 81 mg by mouth daily.  . B Complex-C-Folic Acid (VOL-CARE RX) 1 MG TABS   . BAYER CONTOUR TEST test strip TEST BLOOD SUGARS 2 TIMES DAILY  . Blood Glucose Monitoring Suppl (ONE TOUCH ULTRA SYSTEM KIT) W/DEVICE KIT Please dispense for patient to use at home to check blood sugars.  . calcium acetate (PHOSLO) 667 MG capsule   . carvedilol (COREG) 12.5  MG tablet Take 1 tablet (12.5 mg total) by mouth 2 (two) times daily.  . clopidogrel (PLAVIX) 75 MG tablet Take 1 tablet (75 mg total) by mouth daily.  . hyoscyamine (LEVSIN SL) 0.125 MG SL tablet Take 0.125 mg by mouth daily.   . Insulin Detemir (LEVEMIR) 100 UNIT/ML Pen Inject 5 Units into the skin daily.  . Insulin Pen Needle 32G X 6 MM MISC 1 Units by Does not apply route as needed.  . Lancets Thin MISC Using at home to test blood sugars  twice daily.  Marland Kitchen lidocaine (XYLOCAINE) 4 % external solution Apply topically 3 (three) times daily as needed.  Marland Kitchen LORazepam (ATIVAN) 0.5 MG tablet Take 1 tablet (0.5 mg total) by mouth at bedtime.  . mometasone (NASONEX) 50 MCG/ACT nasal spray Place 2 sprays into the nose daily. 2 sprays by Each Nare route daily as needed.  . nitroGLYCERIN (NITROSTAT) 0.4 MG SL tablet Place 1 tablet (0.4 mg total) under the tongue every 5 (five) minutes as needed for chest pain.  Marland Kitchen oxyCODONE-acetaminophen (ROXICET) 5-325 MG per tablet Take 1 tablet by mouth every 8 (eight) hours as needed for severe pain.  . pregabalin (LYRICA) 75 MG capsule Take 1 capsule (75 mg total) by mouth daily.  . rosuvastatin (CRESTOR) 40 MG tablet Take 1 tablet (40 mg total) by mouth daily.    Past Medical History  Diagnosis Date  . Hypertension   . ESRD on hemodialysis     a. [facility] in [place], [place], on a TTS schedule.  She started dialysis in Feb 2014.  Etiology of renal failure not known, likely diabetes.  Has a left upper arm AV graft.  Marland Kitchen COPD (chronic obstructive pulmonary disease)   . Emphysema   . History of pneumonia     June 2012  . History of bronchitis     Mar 2012  . Peripheral vascular disease   . Chronic constipation   . Colon polyps   . Sickle cell trait   . Anemia of chronic disease   . Diabetes mellitus   . Moderate mitral insufficiency     a. 10/2013 Echo: EF 55-60%, mod MR.  . Diabetic neuropathy   . Diabetic retinopathy 05/28/2013    Hx bilat retinal detachment, proliferative diab retinopathy and bilat vitreous hemorrhage   . Hyperlipidemia   . Coronary artery disease     a. 05/2013 NSTEMI/PCI: LM 20d, LAD min irregs, LCX small, nl, OM1 nl, RCA dom 31m (2.5x16 Promus DES), PDA1 80p.  . Chronic diastolic CHF (congestive heart failure)     a. 10/2013 Echo [facility]): EF 55-60%, mod conc LVH, mod MR, mildly dil LA, mild Ao sclerosis w/o stenosis.  . Carotid arterial disease     a. 02/2013 U/S: 40-59% bilat ICA  stenosis - *f/u 02/2014*  . History of tobacco abuse     a. Quit 2012.    Past Surgical History  Procedure Laterality Date  . Abdominal hysterectomy      2000  . Dilation and curettage of uterus      several in the early 80's  . Eye surgery      bilateral laser 2012  . Esophagogastroduodenoscopy      2012  . Colonscopy    . Tubal ligation      1979  . Pars plana vitrectomy  04/22/2011    Procedure: PARS PLANA VITRECTOMY WITH 25 GAUGE;  Surgeon: [name], MD;  Location: [place];  Service: Ophthalmology;  Laterality: Left;  membrane peel, endolaser, gas fluid exchange, silicone oil, repair of complex traction retinal detachment  . Pars plana vitrectomy  09/30/2011    Procedure: PARS PLANA VITRECTOMY WITH 25 GAUGE;  Surgeon: [name], MD;  Location: [place];  Service: Ophthalmology;  Laterality: Right;  Endolaser; Repair of Complex Traction Retinal Detachment  . Gas/fluid exchange  09/30/2011    Procedure: GAS/FLUID EXCHANGE;  Surgeon: [name], MD;  Location: [place];  Service: Ophthalmology;  Laterality: Right;  . Gas insertion  09/30/2011    Procedure: INSERTION OF GAS;  Surgeon: [name], MD;  Location: [place];  Service: Ophthalmology;  Laterality: Right;  [id number]  . Eye surgery      right  . Pars plana vitrectomy  02/24/2012    Procedure: PARS PLANA VITRECTOMY WITH 25 GAUGE;  Surgeon: [name], MD;  Location: [place];  Service: Ophthalmology;  Laterality: Left;  . Silicon oil removal  2/87/8676    Procedure: SILICON OIL REMOVAL;  Surgeon: [name], MD;  Location: [place];  Service: Ophthalmology;  Laterality: Left;  . Ptca    . Thrombectomy / arteriovenous graft revision    . Cardiac catheterization    . Coronary angioplasty  05/28/2014    stent placement to the mid RCA  . Left heart catheterization with coronary angiogram N/A 05/28/2013    Procedure: LEFT HEART CATHETERIZATION WITH CORONARY ANGIOGRAM;  Surgeon: [name], MD;  Location: [facility]  CATH LAB;  Service: Cardiovascular;  Laterality: N/A;    Social History  reports that she quit smoking about 4 years ago. She has never used smokeless tobacco. She reports that she does not drink alcohol or use illicit drugs.  Family History family history includes Breast cancer in her sister; Colon cancer in her father and sister; Diabetes in her brother, brother, and daughter; Heart attack in her brother and mother; Heart disease in her mother; Stroke in her brother and mother. There is no history of Anesthesia problems, Hypotension, Malignant hyperthermia, or Pseudochol deficiency.   Review of Systems  Constitutional: Negative.   Respiratory: Negative.   Cardiovascular: Negative.   Gastrointestinal: Negative.   Musculoskeletal: Negative.   Skin: Negative.   Neurological: Negative.   Hematological: Negative.   Psychiatric/Behavioral: Negative.   All other systems reviewed and are negative.  BP 158/70 mmHg  Pulse 72  Ht 5' 7.5" (1.715 m)  Wt 171 lb 12 oz (77.905 kg)  BMI 26.49 kg/m2  Physical Exam  Constitutional: She is oriented to person, place, and time. She appears well-developed and well-nourished.  AV graft on her left arm  HENT:  Head: Normocephalic.  Nose: Nose normal.  Mouth/Throat: Oropharynx is clear and moist.  Eyes: Conjunctivae are normal. Pupils are equal, round, and reactive to light.  Neck: Normal range of motion. Neck supple. No JVD present. Carotid bruit is present.  Cardiovascular: Normal rate, regular rhythm, S1 normal, S2 normal and intact distal pulses.  Exam reveals no gallop and no friction rub.   Murmur heard.  Crescendo systolic murmur is present with a grade of 2/6  Pulmonary/Chest: Effort normal and breath sounds normal. No respiratory distress. She has no wheezes. She has no rales. She exhibits no tenderness.  Abdominal: Soft. Bowel sounds are normal. She exhibits no distension. There is no tenderness.  Musculoskeletal: Normal range of motion.  She exhibits no edema or tenderness.  Lymphadenopathy:    She has no cervical adenopathy.  Neurological: She is alert and oriented to person,  place, and time. Coordination normal.  Skin: Skin is warm and dry. No rash noted. No erythema.  Psychiatric: She has a normal mood and affect. Her behavior is normal. Judgment and thought content normal.    Assessment and Plan  Nursing note and vitals reviewed.

## 2014-07-26 NOTE — Assessment & Plan Note (Signed)
No recent symptoms concerning for angina. No further testing indicated. Suggested she stay on aspirin. Plavix can be held if she is a candidate for renal transplant. She has been on Plavix for over one year since stent was placed December 2014. If she is not a candidate, recommended she stay on her Plavix for now.

## 2014-07-26 NOTE — Assessment & Plan Note (Signed)
Blood pressure borderline elevated today. She reports it drops during and after dialysis. No medication changes made

## 2014-07-26 NOTE — Patient Instructions (Signed)
You are doing well. No medication changes were made.  We will talk with Theresa [name],  Ok to hold plavix if this gets you on the transplant list  Please call us if you have new issues that need to be addressed before your next appt.  Your physician wants you to follow-up in: 6 months.  You will receive a reminder letter in the mail two months in advance. If you don't receive a letter, please call our office to schedule the follow-up appointment.

## 2014-07-26 NOTE — Assessment & Plan Note (Signed)
She reports that she has been evaluated by [facility] and is on a transplant list. Okay to hold Plavix

## 2014-07-28 DIAGNOSIS — D509 Iron deficiency anemia, unspecified: Secondary | ICD-10-CM | POA: Diagnosis not present

## 2014-07-28 DIAGNOSIS — N186 End stage renal disease: Secondary | ICD-10-CM | POA: Diagnosis not present

## 2014-07-28 DIAGNOSIS — D631 Anemia in chronic kidney disease: Secondary | ICD-10-CM | POA: Diagnosis not present

## 2014-07-28 DIAGNOSIS — Z992 Dependence on renal dialysis: Secondary | ICD-10-CM | POA: Diagnosis not present

## 2014-07-28 DIAGNOSIS — N2581 Secondary hyperparathyroidism of renal origin: Secondary | ICD-10-CM | POA: Diagnosis not present

## 2014-07-31 DIAGNOSIS — N186 End stage renal disease: Secondary | ICD-10-CM | POA: Diagnosis not present

## 2014-07-31 DIAGNOSIS — D509 Iron deficiency anemia, unspecified: Secondary | ICD-10-CM | POA: Diagnosis not present

## 2014-07-31 DIAGNOSIS — D631 Anemia in chronic kidney disease: Secondary | ICD-10-CM | POA: Diagnosis not present

## 2014-07-31 DIAGNOSIS — Z992 Dependence on renal dialysis: Secondary | ICD-10-CM | POA: Diagnosis not present

## 2014-07-31 DIAGNOSIS — N2581 Secondary hyperparathyroidism of renal origin: Secondary | ICD-10-CM | POA: Diagnosis not present

## 2014-08-02 DIAGNOSIS — D509 Iron deficiency anemia, unspecified: Secondary | ICD-10-CM | POA: Diagnosis not present

## 2014-08-02 DIAGNOSIS — N2581 Secondary hyperparathyroidism of renal origin: Secondary | ICD-10-CM | POA: Diagnosis not present

## 2014-08-02 DIAGNOSIS — Z992 Dependence on renal dialysis: Secondary | ICD-10-CM | POA: Diagnosis not present

## 2014-08-02 DIAGNOSIS — D631 Anemia in chronic kidney disease: Secondary | ICD-10-CM | POA: Diagnosis not present

## 2014-08-02 DIAGNOSIS — N186 End stage renal disease: Secondary | ICD-10-CM | POA: Diagnosis not present

## 2014-08-04 DIAGNOSIS — D509 Iron deficiency anemia, unspecified: Secondary | ICD-10-CM | POA: Diagnosis not present

## 2014-08-04 DIAGNOSIS — Z992 Dependence on renal dialysis: Secondary | ICD-10-CM | POA: Diagnosis not present

## 2014-08-04 DIAGNOSIS — N2581 Secondary hyperparathyroidism of renal origin: Secondary | ICD-10-CM | POA: Diagnosis not present

## 2014-08-04 DIAGNOSIS — D631 Anemia in chronic kidney disease: Secondary | ICD-10-CM | POA: Diagnosis not present

## 2014-08-04 DIAGNOSIS — N186 End stage renal disease: Secondary | ICD-10-CM | POA: Diagnosis not present

## 2014-08-07 DIAGNOSIS — Z992 Dependence on renal dialysis: Secondary | ICD-10-CM | POA: Diagnosis not present

## 2014-08-07 DIAGNOSIS — N186 End stage renal disease: Secondary | ICD-10-CM | POA: Diagnosis not present

## 2014-08-07 DIAGNOSIS — N2581 Secondary hyperparathyroidism of renal origin: Secondary | ICD-10-CM | POA: Diagnosis not present

## 2014-08-07 DIAGNOSIS — D631 Anemia in chronic kidney disease: Secondary | ICD-10-CM | POA: Diagnosis not present

## 2014-08-07 DIAGNOSIS — D509 Iron deficiency anemia, unspecified: Secondary | ICD-10-CM | POA: Diagnosis not present

## 2014-08-08 DIAGNOSIS — Z992 Dependence on renal dialysis: Secondary | ICD-10-CM | POA: Diagnosis not present

## 2014-08-08 DIAGNOSIS — N186 End stage renal disease: Secondary | ICD-10-CM | POA: Diagnosis not present

## 2014-08-09 DIAGNOSIS — Z992 Dependence on renal dialysis: Secondary | ICD-10-CM | POA: Diagnosis not present

## 2014-08-09 DIAGNOSIS — N186 End stage renal disease: Secondary | ICD-10-CM | POA: Diagnosis not present

## 2014-08-09 DIAGNOSIS — N2581 Secondary hyperparathyroidism of renal origin: Secondary | ICD-10-CM | POA: Diagnosis not present

## 2014-08-09 DIAGNOSIS — D631 Anemia in chronic kidney disease: Secondary | ICD-10-CM | POA: Diagnosis not present

## 2014-08-09 DIAGNOSIS — D509 Iron deficiency anemia, unspecified: Secondary | ICD-10-CM | POA: Diagnosis not present

## 2014-08-11 DIAGNOSIS — D631 Anemia in chronic kidney disease: Secondary | ICD-10-CM | POA: Diagnosis not present

## 2014-08-11 DIAGNOSIS — N2581 Secondary hyperparathyroidism of renal origin: Secondary | ICD-10-CM | POA: Diagnosis not present

## 2014-08-11 DIAGNOSIS — N186 End stage renal disease: Secondary | ICD-10-CM | POA: Diagnosis not present

## 2014-08-11 DIAGNOSIS — D509 Iron deficiency anemia, unspecified: Secondary | ICD-10-CM | POA: Diagnosis not present

## 2014-08-11 DIAGNOSIS — Z992 Dependence on renal dialysis: Secondary | ICD-10-CM | POA: Diagnosis not present

## 2014-08-14 DIAGNOSIS — D631 Anemia in chronic kidney disease: Secondary | ICD-10-CM | POA: Diagnosis not present

## 2014-08-14 DIAGNOSIS — Z992 Dependence on renal dialysis: Secondary | ICD-10-CM | POA: Diagnosis not present

## 2014-08-14 DIAGNOSIS — N2581 Secondary hyperparathyroidism of renal origin: Secondary | ICD-10-CM | POA: Diagnosis not present

## 2014-08-14 DIAGNOSIS — N186 End stage renal disease: Secondary | ICD-10-CM | POA: Diagnosis not present

## 2014-08-14 DIAGNOSIS — D509 Iron deficiency anemia, unspecified: Secondary | ICD-10-CM | POA: Diagnosis not present

## 2014-08-16 DIAGNOSIS — D509 Iron deficiency anemia, unspecified: Secondary | ICD-10-CM | POA: Diagnosis not present

## 2014-08-16 DIAGNOSIS — Z992 Dependence on renal dialysis: Secondary | ICD-10-CM | POA: Diagnosis not present

## 2014-08-16 DIAGNOSIS — N2581 Secondary hyperparathyroidism of renal origin: Secondary | ICD-10-CM | POA: Diagnosis not present

## 2014-08-16 DIAGNOSIS — D631 Anemia in chronic kidney disease: Secondary | ICD-10-CM | POA: Diagnosis not present

## 2014-08-16 DIAGNOSIS — N186 End stage renal disease: Secondary | ICD-10-CM | POA: Diagnosis not present

## 2014-08-18 DIAGNOSIS — Z992 Dependence on renal dialysis: Secondary | ICD-10-CM | POA: Diagnosis not present

## 2014-08-18 DIAGNOSIS — N2581 Secondary hyperparathyroidism of renal origin: Secondary | ICD-10-CM | POA: Diagnosis not present

## 2014-08-18 DIAGNOSIS — N186 End stage renal disease: Secondary | ICD-10-CM | POA: Diagnosis not present

## 2014-08-18 DIAGNOSIS — D631 Anemia in chronic kidney disease: Secondary | ICD-10-CM | POA: Diagnosis not present

## 2014-08-18 DIAGNOSIS — D509 Iron deficiency anemia, unspecified: Secondary | ICD-10-CM | POA: Diagnosis not present

## 2014-08-21 DIAGNOSIS — Z992 Dependence on renal dialysis: Secondary | ICD-10-CM | POA: Diagnosis not present

## 2014-08-21 DIAGNOSIS — D631 Anemia in chronic kidney disease: Secondary | ICD-10-CM | POA: Diagnosis not present

## 2014-08-21 DIAGNOSIS — D509 Iron deficiency anemia, unspecified: Secondary | ICD-10-CM | POA: Diagnosis not present

## 2014-08-21 DIAGNOSIS — N2581 Secondary hyperparathyroidism of renal origin: Secondary | ICD-10-CM | POA: Diagnosis not present

## 2014-08-21 DIAGNOSIS — N186 End stage renal disease: Secondary | ICD-10-CM | POA: Diagnosis not present

## 2014-08-23 DIAGNOSIS — N186 End stage renal disease: Secondary | ICD-10-CM | POA: Diagnosis not present

## 2014-08-23 DIAGNOSIS — N2581 Secondary hyperparathyroidism of renal origin: Secondary | ICD-10-CM | POA: Diagnosis not present

## 2014-08-23 DIAGNOSIS — D631 Anemia in chronic kidney disease: Secondary | ICD-10-CM | POA: Diagnosis not present

## 2014-08-23 DIAGNOSIS — D509 Iron deficiency anemia, unspecified: Secondary | ICD-10-CM | POA: Diagnosis not present

## 2014-08-23 DIAGNOSIS — Z992 Dependence on renal dialysis: Secondary | ICD-10-CM | POA: Diagnosis not present

## 2014-08-25 DIAGNOSIS — N186 End stage renal disease: Secondary | ICD-10-CM | POA: Diagnosis not present

## 2014-08-25 DIAGNOSIS — Z992 Dependence on renal dialysis: Secondary | ICD-10-CM | POA: Diagnosis not present

## 2014-08-25 DIAGNOSIS — N2581 Secondary hyperparathyroidism of renal origin: Secondary | ICD-10-CM | POA: Diagnosis not present

## 2014-08-25 DIAGNOSIS — D631 Anemia in chronic kidney disease: Secondary | ICD-10-CM | POA: Diagnosis not present

## 2014-08-25 DIAGNOSIS — D509 Iron deficiency anemia, unspecified: Secondary | ICD-10-CM | POA: Diagnosis not present

## 2014-08-28 ENCOUNTER — Other Ambulatory Visit: Payer: Self-pay | Admitting: Internal Medicine

## 2014-08-28 DIAGNOSIS — N186 End stage renal disease: Secondary | ICD-10-CM | POA: Diagnosis not present

## 2014-08-28 DIAGNOSIS — Z992 Dependence on renal dialysis: Secondary | ICD-10-CM | POA: Diagnosis not present

## 2014-08-28 DIAGNOSIS — D631 Anemia in chronic kidney disease: Secondary | ICD-10-CM | POA: Diagnosis not present

## 2014-08-28 DIAGNOSIS — N2581 Secondary hyperparathyroidism of renal origin: Secondary | ICD-10-CM | POA: Diagnosis not present

## 2014-08-28 DIAGNOSIS — D509 Iron deficiency anemia, unspecified: Secondary | ICD-10-CM | POA: Diagnosis not present

## 2014-08-30 DIAGNOSIS — Z992 Dependence on renal dialysis: Secondary | ICD-10-CM | POA: Diagnosis not present

## 2014-08-30 DIAGNOSIS — N2581 Secondary hyperparathyroidism of renal origin: Secondary | ICD-10-CM | POA: Diagnosis not present

## 2014-08-30 DIAGNOSIS — N186 End stage renal disease: Secondary | ICD-10-CM | POA: Diagnosis not present

## 2014-08-30 DIAGNOSIS — D631 Anemia in chronic kidney disease: Secondary | ICD-10-CM | POA: Diagnosis not present

## 2014-08-30 DIAGNOSIS — D509 Iron deficiency anemia, unspecified: Secondary | ICD-10-CM | POA: Diagnosis not present

## 2014-09-01 DIAGNOSIS — N186 End stage renal disease: Secondary | ICD-10-CM | POA: Diagnosis not present

## 2014-09-01 DIAGNOSIS — N2581 Secondary hyperparathyroidism of renal origin: Secondary | ICD-10-CM | POA: Diagnosis not present

## 2014-09-01 DIAGNOSIS — Z992 Dependence on renal dialysis: Secondary | ICD-10-CM | POA: Diagnosis not present

## 2014-09-01 DIAGNOSIS — D631 Anemia in chronic kidney disease: Secondary | ICD-10-CM | POA: Diagnosis not present

## 2014-09-01 DIAGNOSIS — D509 Iron deficiency anemia, unspecified: Secondary | ICD-10-CM | POA: Diagnosis not present

## 2014-09-04 DIAGNOSIS — D631 Anemia in chronic kidney disease: Secondary | ICD-10-CM | POA: Diagnosis not present

## 2014-09-04 DIAGNOSIS — Z992 Dependence on renal dialysis: Secondary | ICD-10-CM | POA: Diagnosis not present

## 2014-09-04 DIAGNOSIS — D509 Iron deficiency anemia, unspecified: Secondary | ICD-10-CM | POA: Diagnosis not present

## 2014-09-04 DIAGNOSIS — N186 End stage renal disease: Secondary | ICD-10-CM | POA: Diagnosis not present

## 2014-09-04 DIAGNOSIS — N2581 Secondary hyperparathyroidism of renal origin: Secondary | ICD-10-CM | POA: Diagnosis not present

## 2014-09-05 LAB — HEMOGLOBIN A1C: HEMOGLOBIN A1C: 5.1 % (ref 4.0–6.0)

## 2014-09-06 DIAGNOSIS — D631 Anemia in chronic kidney disease: Secondary | ICD-10-CM | POA: Diagnosis not present

## 2014-09-06 DIAGNOSIS — Z992 Dependence on renal dialysis: Secondary | ICD-10-CM | POA: Diagnosis not present

## 2014-09-06 DIAGNOSIS — D509 Iron deficiency anemia, unspecified: Secondary | ICD-10-CM | POA: Diagnosis not present

## 2014-09-06 DIAGNOSIS — N186 End stage renal disease: Secondary | ICD-10-CM | POA: Diagnosis not present

## 2014-09-06 DIAGNOSIS — N2581 Secondary hyperparathyroidism of renal origin: Secondary | ICD-10-CM | POA: Diagnosis not present

## 2014-09-08 DIAGNOSIS — D631 Anemia in chronic kidney disease: Secondary | ICD-10-CM | POA: Diagnosis not present

## 2014-09-08 DIAGNOSIS — N2581 Secondary hyperparathyroidism of renal origin: Secondary | ICD-10-CM | POA: Diagnosis not present

## 2014-09-08 DIAGNOSIS — N186 End stage renal disease: Secondary | ICD-10-CM | POA: Diagnosis not present

## 2014-09-08 DIAGNOSIS — D509 Iron deficiency anemia, unspecified: Secondary | ICD-10-CM | POA: Diagnosis not present

## 2014-09-08 DIAGNOSIS — Z992 Dependence on renal dialysis: Secondary | ICD-10-CM | POA: Diagnosis not present

## 2014-09-11 DIAGNOSIS — D631 Anemia in chronic kidney disease: Secondary | ICD-10-CM | POA: Diagnosis not present

## 2014-09-11 DIAGNOSIS — N186 End stage renal disease: Secondary | ICD-10-CM | POA: Diagnosis not present

## 2014-09-11 DIAGNOSIS — Z992 Dependence on renal dialysis: Secondary | ICD-10-CM | POA: Diagnosis not present

## 2014-09-11 DIAGNOSIS — N2581 Secondary hyperparathyroidism of renal origin: Secondary | ICD-10-CM | POA: Diagnosis not present

## 2014-09-11 DIAGNOSIS — D509 Iron deficiency anemia, unspecified: Secondary | ICD-10-CM | POA: Diagnosis not present

## 2014-09-12 ENCOUNTER — Ambulatory Visit (INDEPENDENT_AMBULATORY_CARE_PROVIDER_SITE_OTHER): Payer: Medicare Other | Admitting: Internal Medicine

## 2014-09-12 ENCOUNTER — Encounter: Payer: Self-pay | Admitting: Internal Medicine

## 2014-09-12 VITALS — BP 186/64 | HR 68 | Temp 98.1°F | Ht 67.5 in | Wt 168.4 lb

## 2014-09-12 DIAGNOSIS — Z1239 Encounter for other screening for malignant neoplasm of breast: Secondary | ICD-10-CM

## 2014-09-12 DIAGNOSIS — I2510 Atherosclerotic heart disease of native coronary artery without angina pectoris: Secondary | ICD-10-CM

## 2014-09-12 DIAGNOSIS — I10 Essential (primary) hypertension: Secondary | ICD-10-CM

## 2014-09-12 DIAGNOSIS — E118 Type 2 diabetes mellitus with unspecified complications: Secondary | ICD-10-CM

## 2014-09-12 DIAGNOSIS — M25551 Pain in right hip: Secondary | ICD-10-CM

## 2014-09-12 MED ORDER — OXYCODONE-ACETAMINOPHEN 5-325 MG PO TABS: 1.0000 | ORAL_TABLET | Freq: Three times a day (TID) | ORAL | Status: DC | PRN

## 2014-09-12 NOTE — Assessment & Plan Note (Signed)
Right hip pain secondary to DJD. Plan for follow up with sports medicine for possible steroid injection. Continue prn Oxycodone for pain. She is not a candidate for NSAIDS because of ESRD on HD.

## 2014-09-12 NOTE — Assessment & Plan Note (Signed)
Recent BG have been well controlled. A1c low at 5.1%. Recommended that we consider stopping Levemir. She would like to wait until after possible steroid injection right hip. Will repeat A1c in 3 months.

## 2014-09-12 NOTE — Assessment & Plan Note (Signed)
Symptomatically, doing well. Will continue current medications. Note that Plavix has been stopped, as she is on the renal transplant list.

## 2014-09-12 NOTE — Patient Instructions (Signed)
Try using Claritin 10mg  during daytime for allergies and Benadryl 25mg  at bedtime as needed.  Follow up in 3 months.

## 2014-09-12 NOTE — Progress Notes (Signed)
Subjective:    Patient ID: Theresa [name], female    DOB: 1956/02/04, 59 y.o.   MRN: [id number]  HPI 59YO female presents for follow up.  Having trouble sleeping because she is woken up with right hip pain. Has follow up with sports medicine for evaluation. Pain described as aching right hip. Also occasionally has pain or numbness radiating down right leg. No weakness in leg. Xray of right hip and lumbar spine showed degenerative disease in the past.  DM - BG have been well controlled by report. Did not bring record with her today. A1c was 5.1%. Taking 5 units of Levemir daily.  HTN - BP has been lower with HD, 115/62 yesterday. Sometimes higher at home. She reports that Dr. Candiss Norse started her on Clonidine, but she is unsure of dose and has been only taking 1 time per day.  Past medical, surgical, family and social history per today's encounter.  Review of Systems  Constitutional: Negative for fever, chills, appetite change, fatigue and unexpected weight change.  Eyes: Negative for visual disturbance.  Respiratory: Negative for shortness of breath.   Cardiovascular: Negative for chest pain, palpitations and leg swelling.  Gastrointestinal: Negative for nausea, vomiting, abdominal pain, diarrhea and constipation.  Musculoskeletal: Positive for myalgias and arthralgias. Negative for back pain.  Skin: Negative for color change and rash.  Neurological: Positive for numbness. Negative for weakness.  Hematological: Negative for adenopathy. Does not bruise/bleed easily.  Psychiatric/Behavioral: Positive for sleep disturbance. Negative for suicidal ideas and dysphoric mood. The patient is not nervous/anxious.        Objective:    BP 186/64 mmHg  Pulse 68  Temp(Src) 98.1 F (36.7 C) (Oral)  Ht 5' 7.5" (1.715 m)  Wt 168 lb 6 oz (76.374 kg)  BMI 25.97 kg/m2  SpO2 98% Physical Exam  Constitutional: She is oriented to person, place, and time. She appears well-developed and well-nourished.  No distress.  HENT:  Head: Normocephalic and atraumatic.  Right Ear: External ear normal.  Left Ear: External ear normal.  Nose: Nose normal.  Mouth/Throat: Oropharynx is clear and moist. No oropharyngeal exudate.  Eyes: Conjunctivae are normal. Pupils are equal, round, and reactive to light. Right eye exhibits no discharge. Left eye exhibits no discharge. No scleral icterus.  Neck: Normal range of motion. Neck supple. No tracheal deviation present. No thyromegaly present.  Cardiovascular: Normal rate, regular rhythm, normal heart sounds and intact distal pulses.  Exam reveals no gallop and no friction rub.   No murmur heard. Pulmonary/Chest: Effort normal and breath sounds normal. No respiratory distress. She has no wheezes. She has no rales. She exhibits no tenderness.  Musculoskeletal: Normal range of motion. She exhibits no edema or tenderness.       Right hip: She exhibits normal range of motion, normal strength and no tenderness.  Lymphadenopathy:    She has no cervical adenopathy.  Neurological: She is alert and oriented to person, place, and time. No cranial nerve deficit. She exhibits normal muscle tone. Coordination normal.  Skin: Skin is warm and dry. No rash noted. She is not diaphoretic. No erythema. No pallor.  Psychiatric: She has a normal mood and affect. Her behavior is normal. Judgment and thought content normal.          Assessment & Plan:   Problem List Items Addressed This Visit      Unprioritized   Coronary artery disease    Symptomatically, doing well. Will continue current medications. Note that Plavix has been  stopped, as she is on the renal transplant list.      Diabetes mellitus, type II - Primary    Recent BG have been well controlled. A1c low at 5.1%. Recommended that we consider stopping Levemir. She would like to wait until after possible steroid injection right hip. Will repeat A1c in 3 months.      Essential (primary) hypertension    BP Readings  from Last 3 Encounters:  09/12/14 186/64  07/26/14 158/70  06/13/14 193/76   BP elevated today. Pt will call to let us know new BP med prescribed by Dr. Candiss Norse. She will also check BP at home. Continue Carvedilol      Right hip pain    Right hip pain secondary to DJD. Plan for follow up with sports medicine for possible steroid injection. Continue prn Oxycodone for pain. She is not a candidate for NSAIDS because of ESRD on HD.      Relevant Medications   oxyCODONE-acetaminophen (ROXICET) 5-325 MG per tablet    Other Visit Diagnoses    Screening for breast cancer        Relevant Orders    MM Digital Screening        Return in about 3 months (around 12/12/2014) for Recheck of Diabetes.

## 2014-09-12 NOTE — Assessment & Plan Note (Signed)
BP Readings from Last 3 Encounters:  09/12/14 186/64  07/26/14 158/70  06/13/14 193/76   BP elevated today. Pt will call to let us know new BP med prescribed by Dr. Candiss Norse. She will also check BP at home. Continue Carvedilol

## 2014-09-12 NOTE — Progress Notes (Signed)
Pre visit review using our clinic review tool, if applicable. No additional management support is needed unless otherwise documented below in the visit note. 

## 2014-09-13 DIAGNOSIS — N186 End stage renal disease: Secondary | ICD-10-CM | POA: Diagnosis not present

## 2014-09-13 DIAGNOSIS — D509 Iron deficiency anemia, unspecified: Secondary | ICD-10-CM | POA: Diagnosis not present

## 2014-09-13 DIAGNOSIS — N2581 Secondary hyperparathyroidism of renal origin: Secondary | ICD-10-CM | POA: Diagnosis not present

## 2014-09-13 DIAGNOSIS — Z992 Dependence on renal dialysis: Secondary | ICD-10-CM | POA: Diagnosis not present

## 2014-09-13 DIAGNOSIS — D631 Anemia in chronic kidney disease: Secondary | ICD-10-CM | POA: Diagnosis not present

## 2014-09-14 ENCOUNTER — Ambulatory Visit: Payer: Medicare Other | Admitting: Podiatry

## 2014-09-15 DIAGNOSIS — N2581 Secondary hyperparathyroidism of renal origin: Secondary | ICD-10-CM | POA: Diagnosis not present

## 2014-09-15 DIAGNOSIS — D631 Anemia in chronic kidney disease: Secondary | ICD-10-CM | POA: Diagnosis not present

## 2014-09-15 DIAGNOSIS — Z992 Dependence on renal dialysis: Secondary | ICD-10-CM | POA: Diagnosis not present

## 2014-09-15 DIAGNOSIS — D509 Iron deficiency anemia, unspecified: Secondary | ICD-10-CM | POA: Diagnosis not present

## 2014-09-15 DIAGNOSIS — N186 End stage renal disease: Secondary | ICD-10-CM | POA: Diagnosis not present

## 2014-09-18 DIAGNOSIS — D631 Anemia in chronic kidney disease: Secondary | ICD-10-CM | POA: Diagnosis not present

## 2014-09-18 DIAGNOSIS — N186 End stage renal disease: Secondary | ICD-10-CM | POA: Diagnosis not present

## 2014-09-18 DIAGNOSIS — N2581 Secondary hyperparathyroidism of renal origin: Secondary | ICD-10-CM | POA: Diagnosis not present

## 2014-09-18 DIAGNOSIS — D509 Iron deficiency anemia, unspecified: Secondary | ICD-10-CM | POA: Diagnosis not present

## 2014-09-18 DIAGNOSIS — Z992 Dependence on renal dialysis: Secondary | ICD-10-CM | POA: Diagnosis not present

## 2014-09-20 DIAGNOSIS — D509 Iron deficiency anemia, unspecified: Secondary | ICD-10-CM | POA: Diagnosis not present

## 2014-09-20 DIAGNOSIS — D631 Anemia in chronic kidney disease: Secondary | ICD-10-CM | POA: Diagnosis not present

## 2014-09-20 DIAGNOSIS — N2581 Secondary hyperparathyroidism of renal origin: Secondary | ICD-10-CM | POA: Diagnosis not present

## 2014-09-20 DIAGNOSIS — Z992 Dependence on renal dialysis: Secondary | ICD-10-CM | POA: Diagnosis not present

## 2014-09-20 DIAGNOSIS — N186 End stage renal disease: Secondary | ICD-10-CM | POA: Diagnosis not present

## 2014-09-22 DIAGNOSIS — N186 End stage renal disease: Secondary | ICD-10-CM | POA: Diagnosis not present

## 2014-09-22 DIAGNOSIS — D509 Iron deficiency anemia, unspecified: Secondary | ICD-10-CM | POA: Diagnosis not present

## 2014-09-22 DIAGNOSIS — Z992 Dependence on renal dialysis: Secondary | ICD-10-CM | POA: Diagnosis not present

## 2014-09-22 DIAGNOSIS — D631 Anemia in chronic kidney disease: Secondary | ICD-10-CM | POA: Diagnosis not present

## 2014-09-22 DIAGNOSIS — N2581 Secondary hyperparathyroidism of renal origin: Secondary | ICD-10-CM | POA: Diagnosis not present

## 2014-09-25 DIAGNOSIS — D509 Iron deficiency anemia, unspecified: Secondary | ICD-10-CM | POA: Diagnosis not present

## 2014-09-25 DIAGNOSIS — Z992 Dependence on renal dialysis: Secondary | ICD-10-CM | POA: Diagnosis not present

## 2014-09-25 DIAGNOSIS — N186 End stage renal disease: Secondary | ICD-10-CM | POA: Diagnosis not present

## 2014-09-25 DIAGNOSIS — N2581 Secondary hyperparathyroidism of renal origin: Secondary | ICD-10-CM | POA: Diagnosis not present

## 2014-09-25 DIAGNOSIS — D631 Anemia in chronic kidney disease: Secondary | ICD-10-CM | POA: Diagnosis not present

## 2014-09-26 ENCOUNTER — Ambulatory Visit (INDEPENDENT_AMBULATORY_CARE_PROVIDER_SITE_OTHER): Payer: Medicare Other | Admitting: Podiatry

## 2014-09-26 ENCOUNTER — Encounter: Payer: Self-pay | Admitting: Podiatry

## 2014-09-26 DIAGNOSIS — B351 Tinea unguium: Secondary | ICD-10-CM | POA: Diagnosis not present

## 2014-09-26 DIAGNOSIS — M79676 Pain in unspecified toe(s): Secondary | ICD-10-CM | POA: Diagnosis not present

## 2014-09-26 NOTE — Progress Notes (Signed)
Patient ID: Theresa [name], female   DOB: 09/21/1955, 59 y.o.   MRN: [id number]  Subjective: 59 y.o.-year-old female returns the office today for painful, elongated, thickened toenails. Denies any redness or drainage around the nails. Denies any acute changes since last appointment and no new complaints today. Denies any systemic complaints such as fevers, chills, nausea, vomiting.   Objective: AAO 3, NAD DP/PT pulses palpable, CRT less than 3 seconds Protective sensation decreased with Simms Weinstein monofilament, Achilles tendon reflex intact.  Nails hypertrophic, dystrophic, elongated, brittle, discolored 10. There is tenderness overlying the nails 15 bilaterally. There is no surrounding erythema or drainage along the nail sites. No open lesions or pre-ulcerative lesions are identified. Scar dorsal right foot.  No other areas of tenderness bilateral lower extremities. No overlying edema, erythema, increased warmth. No pain with calf compression, swelling, warmth, erythema.  Assessment: Patient presents with symptomatic onychomycosis  Plan: -Treatment options including alternatives, risks, complications were discussed -Nails sharply debrided 10 without complication/bleeding. -Discussed daily foot inspection. If there are any changes, to call the office immediately.  -Follow-up in 3 months or sooner if any problems are to arise. In the meantime, encouraged to call the office with any questions, concerns, changes symptoms.

## 2014-09-27 ENCOUNTER — Encounter: Payer: Self-pay | Admitting: Family Medicine

## 2014-09-27 ENCOUNTER — Ambulatory Visit (INDEPENDENT_AMBULATORY_CARE_PROVIDER_SITE_OTHER): Payer: Medicare Other | Admitting: Family Medicine

## 2014-09-27 ENCOUNTER — Ambulatory Visit (INDEPENDENT_AMBULATORY_CARE_PROVIDER_SITE_OTHER)
Admission: RE | Admit: 2014-09-27 | Discharge: 2014-09-27 | Disposition: A | Discharge status: Home / Self Care | Payer: Medicare Other | Source: Ambulatory Visit | Attending: Family Medicine | Admitting: Family Medicine

## 2014-09-27 VITALS — BP 170/86 | HR 73 | Temp 98.3°F | Ht 67.5 in | Wt 170.2 lb

## 2014-09-27 DIAGNOSIS — M5137 Other intervertebral disc degeneration, lumbosacral region: Secondary | ICD-10-CM | POA: Diagnosis not present

## 2014-09-27 DIAGNOSIS — I2510 Atherosclerotic heart disease of native coronary artery without angina pectoris: Secondary | ICD-10-CM | POA: Diagnosis not present

## 2014-09-27 DIAGNOSIS — N186 End stage renal disease: Secondary | ICD-10-CM

## 2014-09-27 DIAGNOSIS — D509 Iron deficiency anemia, unspecified: Secondary | ICD-10-CM | POA: Diagnosis not present

## 2014-09-27 DIAGNOSIS — D631 Anemia in chronic kidney disease: Secondary | ICD-10-CM | POA: Diagnosis not present

## 2014-09-27 DIAGNOSIS — M47897 Other spondylosis, lumbosacral region: Secondary | ICD-10-CM | POA: Diagnosis not present

## 2014-09-27 DIAGNOSIS — Z992 Dependence on renal dialysis: Secondary | ICD-10-CM | POA: Diagnosis not present

## 2014-09-27 DIAGNOSIS — I214 Non-ST elevation (NSTEMI) myocardial infarction: Secondary | ICD-10-CM | POA: Diagnosis not present

## 2014-09-27 DIAGNOSIS — M25551 Pain in right hip: Secondary | ICD-10-CM

## 2014-09-27 DIAGNOSIS — N2581 Secondary hyperparathyroidism of renal origin: Secondary | ICD-10-CM | POA: Diagnosis not present

## 2014-09-27 NOTE — Progress Notes (Signed)
Dr. Frederico Hamman T. Makynlee Kressin, MD, [facility] [place], [address] Phone: [phone] Fax: [phone]  09/27/2014  Patient: Theresa [name], MRN: [id number], DOB: 1956/04/26, 59 y.o.  Primary Physician:  Rica Mast, MD  Chief Complaint: Follow-up  Subjective:   Theresa [name] is a 60 y.o. very pleasant female patient who presents with the following:  I remember this very pleasant well mannered patient with multiple complex medical problems including end-stage renal failure on hemodialysis, status post myocardial infarction in December 2014.  She has done an exhaustive trial of virtually all available conservative measures to her without much relief of her back pain.  I reviewed her hip films with her today in the office, and she has only mild degenerative changes of the hip.  At this point she is off of Plavix, and previously we had discussed epidural steroid injections.   12/01/2013 Last OV with Owens Loffler, MD  Pleasant patient with multiple complex medical problems s/p MI with PCI in 05/2013 with h/o ESRD with ongoing back pain and radiculopathy s/p adequate PT, max dosing of Lyrica, where she will need to be on Plavix until 05/2013.  Back is hurting and getting somewhat numb. - This is intermittent.   TENS unit? [facility] - check on status of TENS unit.  She was unable to get this per report.   Her insurance denied Lidocaine patches in her case - extensive attempt to get approved. Numbing cream x 30 min to 1 hour.   Now she is taking 1/2 tab of percocet in the PM.   1/4 tablet of pain medication in the AM 1/2 tablet in the PM   09/22/2013 Last OV with Owens Loffler, MD  Pleasant patient on HD for ESRD and with recent PCI for MI in 05/2013 on Plavix with ongoing back pain, radiculopathy. Intervally, ativan did minimal to help symptoms, and her insurance did not approve Lidocaine patches. She is  intermittently taking 1/2 tab of Percocet 5 mg, which makes her fall asleep. She is still having mostly r buttocks pain and LBP.  Prior work-up and conservative management has been thorough per below. With higher doses of Lyrica she has had some twitching.   TENS unit Spinal cord stimulator - no Percocet 1/4 tab bid  Plavix, pci 05/2013  08/22/2013 Last OV with Owens Loffler, MD  Thank you for having me see Theresa [name] in consultation today at [facility] for her problem with hip and back pain.  As you may recall, she is a 59 y.o. year old female on hemodialysis for ESRD and on Plavix s/p PCI for NSTEMI recently and IDDM with a history of R sided "hip" pain, back pain, and lumbar radiculopathy for at least 1 year.    Sciatic nerve and goes all the way down hip. Radiating pain down both legs - front and back on the entire leg and to the foot. Has been going on for a year. Went to PT. Also tried some percocet.  Lyrica 100 mg once day, with a history of diabetic neuropathy, and on HD.  HD, 3 x, Tu, Thurs, Sat.   Back and hips hurt all the time.  Walking humped over much of the time.  She describes her hip pain as posterior and does not have anterior groin pain.  R hip x-ray normal, 07/02/2011. These are not available for my review.   [place] 2 years ago, broken  shoulder, but she denies a history of trauma or surgery to the hip or back.  Denies bowel or bladder incontinence. No saddle anesthesia.    Past Medical History, Surgical History, Social History, Family History, Problem List, Medications, and Allergies have been reviewed and updated if relevant.  GEN: No fevers, chills. Nontoxic. Primarily MSK c/o today. MSK: Detailed in the HPI GI: tolerating PO intake without difficulty Neuro: No numbness, parasthesias, or tingling associated. Otherwise the pertinent positives of the ROS are noted above.   Objective:   BP 170/86 mmHg  Pulse 73  Temp(Src) 98.3 F  (36.8 C) (Oral)  Ht 5' 7.5" (1.715 m)  Wt 170 lb 4 oz (77.225 kg)  BMI 26.26 kg/m2  GEN: Well-developed,well-nourished,in no acute distress; alert,appropriate and cooperative throughout examination HEENT: Normocephalic and atraumatic without obvious abnormalities. Ears, externally no deformities PULM: Breathing comfortably in no respiratory distress EXT: No clubbing, cyanosis, or edema PSYCH: Normally interactive. Cooperative during the interview. Pleasant. Friendly and conversant. Not anxious or depressed appearing. Normal, full affect.  Range of motion at  the waist: Flexion, extension, lateral bending and rotation: relatively preserved with some terminal pain  No echymosis or edema Rises to examination table with mild difficulty Gait: minimally antalgic  Inspection/Deformity: N Paraspinus Tenderness: l2-s1 right and left  B Ankle Dorsiflexion (L5,4): 5/5 B Great Toe Dorsiflexion (L5,4): 5/5 Heel Walk (L5): WNL Toe Walk (S1): WNL Rise/Squat (L4): WNL, mild pain  SENSORY B Medial Foot (L4): WNL B Dorsum (L5): WNL B Lateral (S1): WNL Light Touch: WNL Pinprick: WNL  REFLEXES Knee (L4): 2+ Ankle (S1): 2+  B SLR, seated: neg B SLR, supine: pain in back only B FABER: neg B Reverse FABER: neg B Greater Troch: NT B Log Roll: neg B Stork: NT B Sciatic Notch: NT   HIP EXAM: SIDE: b ROM: Abduction, Flexion, Internal and External range of motion: approx 10 deg loss of IROM on R and L Pain with terminal IROM and EROM: posterior only GTB: NT SLR: NEG Knees: No effusion FABER: NT REVERSE FABER: NT, neg Piriformis: TTP Str: flexion: 5/5 abduction: 5/5 adduction: 5/5 Strength testing non-tender  Radiology: Dg Lumbar Spine Complete  09/27/2014   CLINICAL DATA:  Chronic low back pain for many years with right lower extremity radicular symptoms, no known trauma.  EXAM: LUMBAR SPINE - COMPLETE 4+ VIEW  COMPARISON:  Lumbar spine series of August 22, 2013  FINDINGS: The  lumbar vertebral bodies are preserved in height. There is mild degenerative disc space narrowing at L4-5 with anterior endplate osteophytes. This is similar to that seen on the previous study. The other disc space heights are well maintained. There is no spondylolisthesis. There is mild facet joint hypertrophy at L5-S1 bilaterally. The pedicles and transverse processes are intact.  IMPRESSION: There is moderate degenerative disc and facet joint change at L5-S1 little changed from the previous study. There is no compression fracture nor other acute bony abnormality.   Electronically Signed   By: David  Martinique M.D.   On: 09/27/2014 11:55     Assessment and Plan:   DDD (degenerative disc disease), lumbosacral - Plan: DG Lumbar Spine Complete, MR Lumbar Spine Wo Contrast  Right hip pain  ESRD on hemodialysis  NSTEMI (non-ST elevated myocardial infarction)  Multiple years of low back pain with intermittent right-sided radicular pain.  Exhaustive failure of virtually all types of conservative management available to this patient given certain contraindications.  We have had prior discussions about interventions.  Obtain an MRI  of the lumbar spine to evaluate for spinal stenosis, disc herniation.  Preprocedural prior to consult interventional spine.  I can think of nothing to add from a conservative standpoint.  New Prescriptions   No medications on file   Orders Placed This Encounter  Procedures  . DG Lumbar Spine Complete  . MR Lumbar Spine Wo Contrast    Signed,  Lun Muro T. Tyrie Porzio, MD   Patient's Medications  New Prescriptions   No medications on file  Previous Medications   ALBUTEROL (PROAIR HFA) 108 (90 BASE) MCG/ACT INHALER    Inhale 1 puff into the lungs every 4 (four) hours as needed.    ALCOHOL SWABS PADS    Patient uses at home to assist with testing blood sugars twice daily.   ASPIRIN EC 81 MG TABLET    Take 81 mg by mouth daily.   B COMPLEX-C-FOLIC ACID (VOL-CARE RX) 1 MG  TABS       BAYER CONTOUR TEST TEST STRIP    TEST BLOOD SUGARS 2 TIMES DAILY   BLOOD GLUCOSE MONITORING SUPPL (ONE TOUCH ULTRA SYSTEM KIT) W/DEVICE KIT    Please dispense for patient to use at home to check blood sugars.   CALCIUM ACETATE (PHOSLO) 667 MG CAPSULE       CARVEDILOL (COREG) 12.5 MG TABLET    Take 1 tablet (12.5 mg total) by mouth 2 (two) times daily.   CLONIDINE (CATAPRES) 0.1 MG TABLET    Take 0.1 mg by mouth daily.    INSULIN DETEMIR (LEVEMIR) 100 UNIT/ML PEN    Inject 5 Units into the skin daily.   LANCETS THIN MISC    Using at home to test blood sugars twice daily.   LIDOCAINE (XYLOCAINE) 4 % EXTERNAL SOLUTION    Apply topically 3 (three) times daily as needed.   MOMETASONE (NASONEX) 50 MCG/ACT NASAL SPRAY    Place 2 sprays into the nose daily. 2 sprays by Each Nare route daily as needed.   NITROGLYCERIN (NITROSTAT) 0.4 MG SL TABLET    Place 1 tablet (0.4 mg total) under the tongue every 5 (five) minutes as needed for chest pain.   NOVOFINE 32G X 6 MM MISC    USE WITH INUSLIN AS NEEDED   OXYCODONE-ACETAMINOPHEN (ROXICET) 5-325 MG PER TABLET    Take 1 tablet by mouth every 8 (eight) hours as needed for severe pain.   PREGABALIN (LYRICA) 75 MG CAPSULE    Take 1 capsule (75 mg total) by mouth daily.   ROSUVASTATIN (CRESTOR) 40 MG TABLET    Take 1 tablet (40 mg total) by mouth daily.  Modified Medications   No medications on file  Discontinued Medications   HYOSCYAMINE (LEVSIN SL) 0.125 MG SL TABLET    Take 0.125 mg by mouth daily.

## 2014-09-27 NOTE — Patient Instructions (Signed)

## 2014-09-27 NOTE — Progress Notes (Signed)
Pre visit review using our clinic review tool, if applicable. No additional management support is needed unless otherwise documented below in the visit note. 

## 2014-09-28 ENCOUNTER — Telehealth: Payer: Self-pay | Admitting: Internal Medicine

## 2014-09-28 NOTE — Telephone Encounter (Signed)
Patient returned Donna's call. °

## 2014-09-28 NOTE — Telephone Encounter (Signed)
X-ray results given to patient.  See result note.

## 2014-09-29 ENCOUNTER — Other Ambulatory Visit: Payer: Self-pay

## 2014-09-29 DIAGNOSIS — D509 Iron deficiency anemia, unspecified: Secondary | ICD-10-CM | POA: Diagnosis not present

## 2014-09-29 DIAGNOSIS — Z992 Dependence on renal dialysis: Secondary | ICD-10-CM | POA: Diagnosis not present

## 2014-09-29 DIAGNOSIS — N2581 Secondary hyperparathyroidism of renal origin: Secondary | ICD-10-CM | POA: Diagnosis not present

## 2014-09-29 DIAGNOSIS — N186 End stage renal disease: Secondary | ICD-10-CM | POA: Diagnosis not present

## 2014-09-29 DIAGNOSIS — D631 Anemia in chronic kidney disease: Secondary | ICD-10-CM | POA: Diagnosis not present

## 2014-09-29 NOTE — Consult Note (Signed)
Present Illness 59 yo female with history of ESRD on HD, DM, HTN, HLD, COPD, former tobacco abuse admitted this am with worsened SOB, cough, back pain and chest pain. First troponin is 4.0. She has been managed on a heparin drip this am. While in dialysis, she has had worsening of her upper back pain, chest pain and SOB. Last troponin is 9.0. EKG with diffuse ST depression inferior and anterolateral leads.   I am called to see the patient in the dialysis unit. She is critically ill appearing c/o dyspnea, upper back and chest pain. Repeat EKG with continued ST depression though much less apparent. Her CT chest with evidence of pneumonia.  PMH: Tobacco abuse, ESRD on HD, HTN, HLD, DM, COPD, diabetic foot ulcer, chronic diastolic CHF, anemia of chronic disease,   PSH: Tubal ligation  SH: stopped smoking one month ago. No alcohol or illicit drug use  FH: Father had CAD  All: PCN, hydrocodone  Meds: see computer list.   Physical Exam:  GEN critically ill appearing   HEENT Oropharynx clear   NECK supple   RESP postive use of accessory muscles  rhonchi   CARD Regular rate and rhythm  Normal, S1, S2   ABD soft  normal BS   EXTR negative edema   SKIN normal to palpation   NEURO cranial nerves intact   PSYCH A+O to time, place, person   Review of Systems:  Subjective/Chief Complaint c/o ongoing chest and back pain. Dyspnea.   Lab Results: Hepatic:  20-Dec-14 06:07   Bilirubin, Total 0.6  Alkaline Phosphatase 98 (45-117 NOTE: New Reference Range 04/29/13)  SGPT (ALT) 24  SGOT (AST)  74  Total Protein, Serum 7.4  Albumin, Serum  2.5  Routine Chem:  20-Dec-14 06:07   Result Comment BNP - REPEATED AND CONFIRMED BY DILUTION  Result(s) reported on 28 May 2013 at 07:26AM.  Result Comment POTASSIUM/AST - Slight hemolysis, interpret results with  - caution.  Result(s) reported on 28 May 2013 at 06:44AM.  Result Comment TROPONIN - RESULTS VERIFIED BY REPEAT TESTING.  -  LAB/MINDY HALE AT 8466 05/28/13  - READ-BACK PROCESS PERFORMED.  Result(s) reported on 28 May 2013 at 07:26AM.  B-Type Natriuretic Peptide [facility])  [phone]  Glucose, Serum  250  BUN  36  Creatinine (comp)  8.39  Sodium, Serum  131  Potassium, Serum 5.0  Chloride, Serum 99  CO2, Serum 23  Calcium (Total), Serum  8.0  Osmolality (calc) 279  eGFR (African American)  6  eGFR (Non-African American)  5 (eGFR values <66m/min/1.73 m2 may be an indication of chronic kidney disease (CKD). Calculated eGFR is useful in patients with stable renal function. The eGFR calculation will not be reliable in acutely ill patients when serum creatinine is changing rapidly. It is not useful in  patients on dialysis. The eGFR calculation may not be applicable to patients at the low and high extremes of body sizes, pregnant women, and vegetarians.)  Anion Gap 9    11:25   Result Comment TROPONIN - RESULTS VERIFIED BY REPEAT TESTING.  - PREVIOUSLY CALLED AT 0722 05/28/13  - BY LAB  Result(s) reported on 28 May 2013 at 12:09PM.  Cardiac:  20-Dec-14 06:07   Troponin I  4.40 (0.00-0.05 0.05 ng/mL or less: NEGATIVE  Repeat testing in 3-6 hrs  if clinically indicated. >0.05 ng/mL: POTENTIAL  MYOCARDIAL INJURY. Repeat  testing in 3-6 hrs if  clinically indicated. NOTE: An increase or decrease  of 30% or more  on serial  testing suggests a  clinically important change)  CPK-MB, Serum  12.6 (Result(s) reported on 28 May 2013 at 07:45AM.)    11:25   Troponin I  9.30 (0.00-0.05 0.05 ng/mL or less: NEGATIVE  Repeat testing in 3-6 hrs  if clinically indicated. >0.05 ng/mL: POTENTIAL  MYOCARDIAL INJURY. Repeat  testing in 3-6 hrs if  clinically indicated. NOTE: An increase or decrease  of 30% or more on serial  testing suggests a  clinically important change)  CPK-MB, Serum  15.1 (Result(s) reported on 28 May 2013 at 12:03PM.)  Routine Coag:  20-Dec-14 09:19   Activated PTT (APTT) 32.0 (A HCT value  >55% may artifactually increase the APTT. In one study, the increase was an average of 19%. Reference: "Effect on Routine and Special Coagulation Testing Values of Citrate Anticoagulant Adjustment in Patients with High HCT Values." American Journal of Clinical Pathology 2006;126:400-405.)  Routine Hem:  20-Dec-14 06:07   WBC (CBC)  13.5  RBC (CBC)  3.02  Hemoglobin (CBC)  10.0  Hematocrit (CBC)  30.6  Platelet Count (CBC) 183 (Result(s) reported on 28 May 2013 at [facility].)  MCV  102  MCH 33.0  MCHC 32.5  RDW  17.6   EKG:  EKG Interp. by me  NSR   Interpretation ST depression inferior and anterolateral leads. Subtle ST elevation V2, V3 that is likely a repolarization abnormality.   Radiology Results: XRay:    20-Dec-14 06:38, Chest Portable Single View  Chest Portable Single View   REASON FOR EXAM:    Shortness of Breath  COMMENTS:       PROCEDURE: DXR - DXR PORTABLE CHEST SINGLE VIEW  - May 28 2013  6:38AM     CLINICAL DATA:  Shortness of breath.    EXAM:  PORTABLE CHEST - 1 VIEW    COMPARISON:  05/14/2013    FINDINGS:  New bilateral opacification, with a rounded dense opacities. These  were not present previously. There may be small effusions given  haziness of the lower chest. No pneumothorax. No acute osseous  findings.     IMPRESSION:  Multi focal, bilateral airspace disease is consistent with pneumonia  in the appropriate clinical setting.      Electronically Signed    By: Jorje Guild M.D.    On: 05/28/2013 06:46         Verified By: Gilford Silvius, M.D.,  CT:    20-Dec-14 10:00, CT Chest With Contrast  CT Chest With Contrast   REASON FOR EXAM:    resp failure, eval for extent of pna  COMMENTS:       PROCEDURE: CT  - CT CHEST WITH CONTRAST  - May 28 2013 10:00AM     CLINICAL DATA:  Persistent dyspnea, history of COPD, current  symptoms unresponsive to breathing treatments    EXAM:  CT CHEST WITH CONTRAST    TECHNIQUE:  Multidetector  CT imaging of the chest was performed during  intravenous contrast administration.  CONTRAST:  75 cc of Isovue 370    COMPARISON:  Chest x-ray of May 28, 2013 and chest CT scan of  May 12, 2013. Marland Kitchen    FINDINGS:  At lung window settings there are multiple new bilateral patchy  interstitial and confluent alveolar densities. Air bronchograms are  visible. There are stable pleural based subcentimeter nodules in the  right upper lobe.    At mediastinal window settings there are small bilateral pleural  effusions layering posteriorly. There is a greater  volume of fluid  on the right than on the left. The cardiac chambers are top-normal  in size. There is a tiny pericardial effusion. The study was not  tailored for pulmonary arterial tree evaluation. The visualized  portions of the pulmonary arterial tree appear normal. The caliber  of the thoracic aorta is normal. There is no evidence of a false  lumen. The thoracic esophagus exhibits normal caliber. There are  pretracheal and AP window lymph nodes these measure less than 1 cm  in short axis.    Within the upper abdomen the observed portions of the liver and  spleen appear normal. The gallbladder is partially included in the  field of view and exhibits no stones. The thoracic vertebral bodies  are preserved in height.     IMPRESSION:  1. Patchy interstitial and alveolar infiltrates in both lungs are  consistent with pneumonia.  2. The cardiac chambers are mildly enlarged and there is a tiny  pericardial effusion.  3. The caliber of the thoracic aorta is normal and there is no false  lumen. The visualized portions of the pulmonary arterial tree  exhibit no filling defects to suggest an acute pulmonary embolism.  4. There are stable pleural based nodules in the right upper lobe  which will merit followup.      Electronically Signed    By: David  Martinique    On: 05/28/2013 10:14         Verified By: DAVID A. Martinique, M.D.,  MD    Hydrocodone: Itching  Vital Signs/Nurse's Notes: **Vital Signs.:   20-Dec-14 10:28  Temperature Temperature (F) 98.8  Celsius 37.1  Temperature Source oral  Pulse Pulse 100  Respirations Respirations 24  Systolic BP Systolic BP 915  Diastolic BP (mmHg) Diastolic BP (mmHg) 75  Mean BP 100  Pulse Ox % Pulse Ox % 90  Oxygen Delivery 4L    05:69  Systolic BP Systolic BP 794  Diastolic BP (mmHg) Diastolic BP (mmHg) 91  Mean BP 118  Pulse Ox % Pulse Ox % 94  Oxygen Delivery Non Rebreather Mask  *Intake and Output.:   20-Dec-14 10:29  Weight Type admission  Weight Method Bed  Current Weight (lbs) (lbs) 172.6  Current Weight (kg) (kg) 78.2  Height (ft) (feet) 5  Height (in) (in) 4  Height (cm) centimeters 162.5  BSA (m2) 1.8  BMI (kg/m2) 29.6    Impression 1. NSTEMI: Pt with multiple risk factors for CAD including tobacco abuse, DM, HTN, HLD and FH of CAD. She has been on dialysis for over a year. Now with chest pain/upper back pain and rising troponin. Will continue heparin drip and transport to [facility] for urgent cardiac cath. I spoke to Dr. Irish Lack who will be the cath doctor. She has received ASA 324 mg x 1 here at [facility]. Receiving IV morphine.   2. Pneumonia/COPD: See CT chest report. Will need continued treatment after cath.   3. HTN: continue home meds.   Electronic Signatures: Burnell Blanks (MD)  (Signed 20-Dec-14 14:11)  Authored: General Aspect/Present Illness, History and Physical Exam, Review of System, Labs, EKG , Radiology, Allergies, Vital Signs/Nurse's Notes, Impression/Plan   Last Updated: 20-Dec-14 14:11 by Burnell Blanks (MD)

## 2014-09-29 NOTE — Discharge Summary (Signed)
PATIENT NAME:  Theresa [name], Theresa [name] MR#:  [id number] DATE OF BIRTH:  05-05-1956  DATE OF ADMISSION:  05/13/2013 DATE OF DISCHARGE:  05/17/2013  REASON FOR ADMISSION: Significant shortness of breath.   DISCHARGE DIAGNOSES: 1.  Acute respiratory failure.  2.  Atypical pneumonia.  3.  Chronic obstructive pulmonary disease.  4.  Chronic obstructive pulmonary disease exacerbation.  5.  Congestive heart failure/diastolic dysfunction with fluid overload due to end-stage renal disease.  6.  End-stage renal disease, on dialysis.  7.  Former smoker.  8.  Type 2 diabetes.  9.  Hypertension.  10.  Accelerated hypertension.  11.  Hyperlipidemia.   DISPOSITION: Home.   MEDICATIONS AT DISCHARGE: Coreg 6.25 mg twice daily, Rena-Vite 1 once daily, Crestor 40 mg daily, Levemir 20 units subcutaneously once a day, Lyrica 50 mg once a day, sertraline 50 mg once a day, losartan 20 mg once a day; prednisone tapered from 30, decrease 10 mg every day until gone; clonidine 0.3 mg 2 times daily (this is a new medication), amlodipine 10 mg once a day (this is an increase in the dose); amoxicillin with clavulanic 250/125, take 1 once a day for 6 more days, give after dialysis on dialysis days; albuterol nebulizer solution, and the nebulizer has been prescribed to the patient.   FOLLOWUP: With Dr. Ronette Deter in the next 1 to 2 weeks. Follow up with renal as needed.   Important results: Blood cultures were negative. Sputum culture showed consistent with normal flora. D-dimer is slightly elevated at 1.03. Hemoglobin on admission 9.6, white count 11.3. White count went up to 19,000, came down to 18,000 prior to discharge, likely secondary to steroids. Creatinine 5.3 at discharge. Sodium 137 on admission. BNP 21,000.   CT angiogram to rule out pulmonary embolism showed:  1.  No evidence of pulmonary embolism. 2.  Relatively diffuse solid interstitial opacities, somewhat nodular in favor of atypical infection.  3.   Pulmonary nodules up to 8 mm since in February consistent with benign etiology.  4.  Coronary artery disease.  5.  Trace pleural effusion.   Chest x-ray: Low-grade pulmonary interstitial edema with small bilateral pleural effusions.  Alveolar pneumonia was not demonstrated (and this is 2 days after admission) which how some improvement.   HOSPITAL COURSE: This is a very nice 59 year old female who came status post respiratory failure. She has hypertension, diabetes, end-stage renal disease, COPD. Presented to the Emergency Room needing 2 liters of oxygen due to low O2 sats. Complaining of mostly shortness of breath but no significant chest pain. She was diagnosed with acute respiratory failure with oxygen saturation at 84% on room air. The patient had an x-ray that showed bilateral pneumonitis versus CHF. CT scan showed findings that were consistent with atypical pneumonia on top of fluid overload. The patient was treated with antibiotics for healthcare-acquired pneumonia, and since she was wheezing and having a history of COPD, she was started on steroids. Since the patient had end-stage renal disease, she was hemodialyzed and after hemodialysis, her shortness of breath actually improved significantly.   The patient presented with accelerated hypertension with systolics above [id number], for which she was treated with IV medications. Her medications were addressed, and actually we added on clonidine, increased her Norvasc, and continued her previous medications.   As far as her diabetes, the patient was stable during this hospitalization. She has chronic disease anemia that was stable as well.   The patient actually did pretty well and had a little bit  of shortness of breath that was improving day by day. As far as her acute respiratory failure, the patient was treated with antibiotics including Zosyn, vancomycin and Levaquin, and she was discharged on Augmentin.   The thing about the pneumonia was that at  first it looked more like atypical pneumonia, pneumonitis, but that might be just a finding related to the fluid overload. We decided at the end to treat it with Augmentin only since she had such a good improvement with Zosyn.   Clinically, the patient is discharged in good condition. As far as her diabetes, continue home medications. For end-stage renal disease, continue Tuesday, Thursday, Saturday hemodialysis. For blood pressure, we recommended to follow up with Dr. Ronette Deter closely to get some further  adjustment of her medications.   I spent about 45 minutes with this discharge.   ____________________________ [place] Sink, MD rsg:jcm D: 05/18/2013 13:15:39 ET T: 05/18/2013 13:45:42 ET JOB#: [id number]  cc: Vale Sink, MD, <Dictator> Eduard Clos. Gilford Rile, MD Roselie Awkward America Brown MD ELECTRONICALLY SIGNED 05/19/2013 18:17

## 2014-09-29 NOTE — Op Note (Signed)
PATIENT NAME:  Theresa [name], Theresa [name] MR#:  [id number] DATE OF BIRTH:  Oct 08, 1955  DATE OF PROCEDURE:  02/23/2013  PREOPERATIVE DIAGNOSES:  1. Thrombosed left forearm arteriovenous loop graft.  2. End-stage renal disease requiring hemodialysis.   POSTOPERATIVE DIAGNOSES:  1. Thrombosed left forearm arteriovenous loop graft.  2. End-stage renal disease requiring hemodialysis.   PROCEDURES PERFORMED:  1. Contrast injection, left forearm loop graft.  2. Mechanical thrombectomy with Trerotola device, left forearm loop graft.  3. Percutaneous transluminal angioplasty to 7 mm venous outflow.   SURGEON: Katha Cabal, M.D.   SEDATION: Versed 4 mg plus fentanyl 150 mcg administered IV. Continuous ECG, pulse oximetry and cardiopulmonary monitoring is performed throughout the entire procedure by the interventional radiology nurse. Total sedation time was 50 minutes.   ACCESS:  1. A 6-French sheath antegrade direction, left forearm loop graft.  2. A 6-French sheath retrograde direction, left forearm loop graft.   CONTRAST USED: Isovue 32 mL.   FLUOROSCOPY TIME: 2.7 minutes.   INDICATIONS: Theresa [name] is a 59 year old woman on hemodialysis who presented with thrombosis of her access. The risks and benefits for intervention were reviewed. All questions answered. The patient has agreed to proceed.   DESCRIPTION OF PROCEDURE: The patient is taken to the special procedure suite, placed in the supine position. After the left arm is extended palm upward, the left arm is prepped and draped in a sterile fashion. After adequate sedation is achieved, appropriate timeout is called. Lidocaine 1% is infiltrated in the soft tissues overlying the AV graft. In an antegrade direction, micropuncture needle is inserted, microwire followed by microsheath, J-wire followed by 6-French sheath. Hand injection of contrast verifies thrombus, and 4000 units of heparin are given.   A floppy Glidewire and KMP catheter are then  negotiated through the graft and into the venous system in the upper arm, and venography of the upper arm and central veins is performed to assure there is adequate outflow. Once this it is verified, Trerotola device is opened onto the field, and the venous portion of the loop graft is treated with the Trerotola. By the defamation of the Trerotola, it is quite clear there is a high-grade stenosis at the venous anastomosis extending into the venous outflow, and after multiple passes with the Trerotola, a Magic Torque wire is advanced and subsequently balloon angioplasty is performed x2 using a 7 x 4 balloon. It is a [place] balloon, and it is inflated to 18 to 20 atmospheres for 1 minute duration. Followup contrast injection demonstrates that the venous side is free of thrombus and that the anastomotic area appears well treated.   A micropuncture needle is then used to access the graft in an retrograde direction, microwire followed by microsheath, J-wire followed by a 6-French sheath. Trerotola device is then advanced through the arterial portion of the loop graft out into the brachial artery. The basket is opened but not engaged. It is then slowly pulled back into the graft where it is turned on. Several passes are made. Flow is now re-established through the graft. Followup contrast injection demonstrates there is still a small area of narrowing in the venous outflow, and the 7 x 4 balloon is reintroduced through the antegrade sheath. Balloon angioplasty is again performed for 1 minute. With the balloon inflated, reflux of contrast is used to demonstrate the arterial portion which is free of thrombus. Followup contrast injection of the venous side demonstrates there is resolution of this narrowing as well. By palpation, there is  a soft continuous thrill in the graft consistent with a low resistance outflow.   Pursestring sutures of 4-0 Monocryl are placed around each sheath, and the sheaths are removed. Light  pressure is held. There are no immediate complications.   INTERPRETATION: Initial views demonstrate thrombus within the loop graft. The veins of the upper arm as well as the central veins are all widely patent. Following thrombectomy, there is narrowing at the venous anastomosis which is treated with a 7 mm balloon. Arterial portion is widely patent. Following angioplasty, there is wide patency of the venous outflow.   SUMMARY: Successful salvage of left forearm AV loop graft.    ____________________________ Katha Cabal, MD ggs:gb D: 02/23/2013 17:36:23 ET T: 02/23/2013 23:51:04 ET JOB#: [id number]  cc: Katha Cabal, MD, <Dictator> Anderson [place] A. Gilford Rile, [place] MD ELECTRONICALLY SIGNED 03/10/2013 8:41

## 2014-09-29 NOTE — H&P (Signed)
PATIENT NAME:  Theresa [name], CLAUDIO MR#:  [id number] DATE OF BIRTH:  [birth year]  DATE OF ADMISSION:  05/13/2013  PRIMARY CARE PHYSICIAN: Ronette Deter.   CHIEF COMPLAINT: Shortness of breath.   HISTORY OF PRESENTING ILLNESS: A 59 year old [place] American female patient with history of hypertension, diabetes, end-stage renal disease, COPD, presents to the hospital complaining of shortness of breath of about 2 weeks. This has progressively worsened, and she decided to present to the Emergency Room today. Here in the Emergency Room, the patient has been found to be needing 2 liters of oxygen, wheezing, CT scan of the chest showing pneumonia and the patient is being admitted to the hospitalist service.   She does not complain of any PND, orthopnea. Does have chronic lower extremity swelling which is the same. Does not complain of any fevers. Had dialysis earlier today and states that she is at her dry weight.   She also has left-sided chest pain which is pleuritic, worse with taking deep breath or coughing. She has green productive sputum. No recent antibiotic use.   The patient's blood pressure in the Emergency Room was as high as 216/88.   PAST MEDICAL HISTORY:  1. End-stage renal disease, on hemodialysis.  2. Hypertension.  3. Diabetes mellitus, insulin dependent.  4. Hyperlipidemia.  5. Restrictive lung disease/COPD.  6. Diabetic foot ulcers in the past.  7. Chronic diastolic CHF for which she sees Dr. Rockey Situ.  8. Anemia of chronic disease.   PAST SURGICAL HISTORY: Tubal ligation.   SOCIAL HISTORY: The patient continues to smoke cigarettes. No alcohol. No illicit drug use.   FAMILY HISTORY: Mother died of colon cancer. Father died of MI.    ALLERGIES: PENICILLIN.   HOME MEDICATIONS: Include:  1. Coreg 6.25 mg oral 2 times a day.  2. Crestor 40 mg daily.  3. Levemir 20 units subcutaneous once a day.  4. Losartan 50 mg oral daily.  5. Lyrica 50 mg oral daily.  6. Nasonex 50 mcg nasal  spray once a day.  7. Rena-Vite 1 tablet once a day.  8. Sertraline 50 mg oral once a day.   REVIEW OF SYSTEMS:  CONSTITUTIONAL: Complains of fatigue, weakness.  EYES: No blurred vision, pain, redness.  EARS, NOSE, THROAT: No tinnitus, ear pain, hearing loss.   RESPIRATORY: Has shortness of breath, green productive sputum and wheezing.  CARDIOVASCULAR: Has left-sided chest pain and chronic edema.  GASTROINTESTINAL: No nausea, vomiting, diarrhea, abdominal pain.  GENITOURINARY: No dysuria, hematuria, frequency. She is on hemodialysis.  MUSCULOSKELETAL: No back pain, arthritis.  INTEGUMENTARY: No acne, rash, lesion.  ENDOCRINE: No polyuria, nocturia, thyroid problems.  HEMATOLOGIC AND LYMPHATIC: No anemia, easy bruising, bleeding.  NEUROLOGIC: No focal numbness, weakness, seizures. PSYCHIATRIC: No anxiety or depression.   PHYSICAL EXAMINATION:  VITAL SIGNS: Show temperature 99.1, blood pressure 216/88, pulse of 85, saturating 84% on room air and 98% on 2 liters oxygen.  GENERAL: Obese African American female patient sitting up in bed in respiratory distress with conversational dyspnea.  PSYCHIATRIC: Alert, oriented x 3. Mood and affect appropriate. Anxious.  HEENT: Atraumatic, normocephalic. Oral mucosa moist and pink. External ears and nose normal. Pallor positive. No icterus. Pupils bilaterally equal and reactive to light.  NECK: Supple. No thyromegaly or palpable lymph nodes. Trachea midline. No carotid bruit or JVD.  CARDIOVASCULAR: S1 and S2 without any murmurs. Peripheral pulses 2+. Edema 2+ in the lower extremities.  RESPIRATORY: Increased work of breathing. Bilateral wheezing, crackles. Decreased air entry.  GASTROINTESTINAL: Soft abdomen,  nontender. Bowel sounds present. No hepatosplenomegaly palpable.  SKIN: Warm and dry. No petechiae, rash or ulcers.  MUSCULOSKELETAL: No joint swelling, redness, effusion of the large joints. Normal muscle tone.  NEUROLOGIC: Motor strength 5/5  in upper extremities. Sensation to fine touch intact all over.  LYMPHATIC: No cervical lymphadenopathy.   LABORATORY STUDIES: Show glucose of 115, BNP of 21,994, BUN 13, creatinine 5.59, sodium 137, potassium 3.8, chloride 99. Troponin 0.03.   WBC 11.3, hemoglobin 9.6, platelets of 203. INR of 1. D-dimer 1.03.   Chest x-ray, PA and lateral, shows no acute abnormalities. No edema.   CT scan shows multiple nodules stable from prior CT and bilateral pneumonitis, trace right pleural fluid.   EKG shows normal sinus rhythm. No acute ST wave changes.   ASSESSMENT AND PLAN:  1. Acute respiratory failure with saturation 84% on room air secondary to bilateral pneumonitis and chronic obstructive pulmonary disease exacerbation. Will start the patient on antibiotics for healthcare-acquired pneumonia along with steroids and nebulizers. Get sputum and blood cultures. Wean off oxygen as tolerated. The patient has not been using any inhalers at home. Will need albuterol rescue inhaler at the time of discharge. I have counseled her to quit smoking for greater than 3 minutes, and she has verbalized understanding.  2. End-stage renal disease, on hemodialysis: Consult nephrology for dialysis needs.  3. Accelerated hypertension: Blood pressure greater than [id number] systolic. Will use intravenous p.r.n. medications along with her home medications to control blood pressure. I suspect this was elevated secondary to her anxiety with shortness of breath and the left-sided chest pain.  4. Left-sided chest pain: Pleuritic, likely from pneumonia. Will get 2 more sets of cardiac enzymes. Initial cardiac enzymes normal.  5. Diabetes mellitus: Continue home insulin and sliding scale insulin.  6. Anemia of chronic disease, stable.  7. Deep venous thrombosis prophylaxis with heparin.   CODE STATUS: FULL CODE.   TIME SPENT TODAY ON THIS CASE: 60 minutes.   ____________________________ Leia Alf Berneda Piccininni, MD srs:gb D: 05/13/2013  02:34:48 ET T: 05/13/2013 02:48:42 ET JOB#: [id number]  cc: Alveta Heimlich R. Maleki Hippe, MD, <Dictator> Eduard Clos. Gilford Rile, MD Murlean Iba, MD Minna Merritts, MD Neita Carp MD ELECTRONICALLY SIGNED 05/18/2013 14:09

## 2014-09-29 NOTE — Op Note (Signed)
PATIENT NAME:  Theresa [name], Theresa [name] MR#:  [id number] DATE OF BIRTH:  08-Jun-1956  DATE OF PROCEDURE:  06/18/2012  PREOPERATIVE DIAGNOSIS:  End-stage renal disease requiring hemodialysis.   POSTOPERATIVE DIAGNOSIS:  End-stage renal disease requiring hemodialysis.  PROCEDURE PERFORMED: Creation of left forearm loop AV graft with Propaten 4 to 7 mm tapered PTFE.   SURGEON: Katha Cabal, M.D.   ANESTHESIA: General by LMA.   FLUIDS: Per anesthesia record.   ESTIMATED BLOOD LOSS: Minimal.   SPECIMEN: None.   INDICATIONS: Theresa [name] is a 59 year old woman who does not have adequate vein for creation of a fistula and therefore, as she is now approaching initiation of dialysis is undergoing placement of an AV graft. Risks and benefits were reviewed. All questions answered. The patient agrees to proceed.   DESCRIPTION OF PROCEDURE: The patient is taken to the Operating Room and placed in the supine position. After adequate general anesthesia is induced and appropriate invasive monitors are placed, she is positioned supine with her left arm extended palm upward. The left arm was prepped and draped in a sterile fashion.   A small transverse incision is made approximately two fingerbreadths below the antecubital crease and the dissection is carried down through the soft tissues.  A rather nice basilic vein is identified. This is dissected circumferentially and looped proximally and distally. Working a bit more laterally the fascia is exposed.  The fascia is opened and the distal brachial artery is dissected proximally and distally and looped with Silastic vessel loops.   A 4 to 7 mm tapered Gore Propaten PTFE graft is then opened onto the field and immersed in saline. A gore tunneler is then used to create a horseshoe-shaped counter-incision is made at the distal apex and the graft is pulled through in two stages. After checking for appropriate lie the arterial anastomosis is created first.   Silastic  vessel loops are used for hemostasis. An 11-blade and then Potts scissors are used to create an arteriotomy.  6-0 Prolene stay sutures are placed and an end graft to side brachial artery anastomosis is fashioned using running CV-6 suture. Flushing maneuvers are performed and flow is established through the graft. The suture line was packed with Surgicel and then a sponge.  The graft was then checked for appropriate confirmation.  It is then flushed with sterile saline and a clamp was applied just above the arterial suture line. It is then laid onto the basilic vein while the vein is in its native bed. It is then beveled.  The basilic vein is delivered into the operative field with Silastic vessel loops. Arteriotomy is made, extended with Potts scissors. 6-0 Prolene stay sutures placed and an end graft to side vein anastomosis is fashioned with running CV-6 sutures. Flushing maneuvers are performed and flow is established through the graft. Soft thrill is noted. Radial pulses maintained.   Both wounds are then irrigated and closed in layers using 3-0 Vicryl followed by 4-0 Monocryl subcuticular and then Dermabond. The patient tolerated the procedure well. There were no immediate complications. Sponge and needle counts are correct and she was taken to the recovery area in stable condition.  ____________________________ Katha Cabal, MD ggs:eg D: 06/18/2012 14:05:19 ET T: 06/18/2012 19:04:20 ET JOB#: [id number]  cc: Katha Cabal, MD, <Dictator> Eduard Clos. Gilford Rile, MD Murlean Iba, MD   Katha Cabal MD ELECTRONICALLY SIGNED 07/02/2012 9:59

## 2014-09-29 NOTE — Op Note (Signed)
PATIENT NAME:  Theresa [name], Theresa [name] MR#:  [id number] DATE OF BIRTH:  July 26, 1955  DATE OF PROCEDURE:  05/04/2013  PREOPERATIVE DIAGNOSES:   1.  Poorly functioning dialysis access.  2.  End-stage renal disease requiring hemodialysis.  POSTOPERATIVE DIAGNOSES:    1.  Poorly functioning dialysis access.  2.  End-stage renal disease requiring hemodialysis.  PROCEDURES PERFORMED: 1.  Contrast injection, left forearm AV loop graft.  2.  Percutaneous transluminal angioplasty of the venous outflow to 7 mm.   SURGEON: Hortencia Pilar, M.D.   SEDATION Versed 4 mg plus fentanyl 150 mcg administered IV. Continuous ECG, pulse oximetry and cardiopulmonary monitoring is performed throughout the entire procedure by the interventional radiology nurse. Total sedation time is 1 hour.   ACCESS:   1.  A 6-French sheath, retrograde direction.  2.  A 6-French sheath, antegrade direction.   CONTRAST USED: Isovue 37 mL.   FLUOROSCOPY TIME: 1.5 minutes.   INDICATIONS: Theresa [name] is a 59 year old woman who presented recently to dialysis and is having increasing problems. She has a moderate-sized hematoma secondary to difficulty with cannulation and her efficiencies of her dialysis are trending in a poor direction. Risks and benefits for angiography and intervention were reviewed. All questions answered. The patient agrees to proceed.   PROCEDURE: The patient is taken to special procedures and placed in the supine position. After adequate sedation is achieved, she is positioned with her arm extended palm upward. The left arm is prepped and draped in a sterile fashion. 1% lidocaine is infiltrated in the soft tissues overlying the palpable graft and access is obtained with a micropuncture needle, microwire followed by microsheath, J-wire followed by a 6-French sheath.  Hand injection of contrast demonstrates sheath is in the retrograde direction and therefore a sheath then is inserted in the opposite limb of the AV graft in a  similar fashion. Imaging demonstrates narrowing of the venous outflow at the level of the venous anastomosis. More proximal veins of the left arm are widely patent, as are the central veins.   Then, 3000 units of heparin is given, and a 6 x 6 balloon is used to predilate the lesion. Subsequently, a 6 x 80 Lutonix balloon is used to dilate the venous portion, and this dilatation is to 12 atmospheres for 3 full minutes. Imaging is performed and subsequently a 7 x 80 balloon is used to post dilate it 1 size larger. Followup angiography demonstrates there is wide patency of the venous portion of the graft, now with significantly improved flow. Pursestring sutures are placed around both sheaths. Sheaths are removed, light pressure is held, and there are no immediate complications.   INTERPRETATION: Initial images demonstrate the graft is widely patent. The proximal veins in the upper arm, as well as the central veins are all widely. There is a 3 cm long narrowing at the level of the anastomosis on the venous side, and this is treated with serial dilatation as described above, including inflation with Lutonix balloon.   SUMMARY: Successful salvage of left forearm loop graft.     ____________________________ Katha Cabal, MD ggs:dmm D: 05/04/2013 10:47:30 ET T: 05/04/2013 11:45:50 ET JOB#: [id number]  cc: Katha Cabal, MD, <Dictator> Katha Cabal MD ELECTRONICALLY SIGNED 05/16/2013 17:19

## 2014-09-30 NOTE — Discharge Summary (Signed)
PATIENT NAME:  Theresa [name], Theresa [name] MR#:  [id number] DATE OF BIRTH:  [birth year]  DATE OF ADMISSION:  05/28/2013 DATE OF DISCHARGE:  05/28/2013  The patient is urgently being transferred to [facility] for an emergent cardiac catheter.   CONSULTANTS: Dr. Juleen China from nephrology, Dr. Angelena Form from cardiology.   PRIMARY CARE PHYSICIAN: Dr. Ronette Deter  CHIEF COMPLAINT: Shortness of breath.   DIAGNOSES AT TIME OF DISCHARGE:  1.  Non-ST elevation myocardial infarction.  2.  Acute respiratory failure secondary to pneumonia, diastolic congestive heart failure and chronic obstructive pulmonary disease.  3.  Severe sepsis.  4.  History of recent hospitalization for acute respiratory failure.  5.  Acute on chronic diastolic congestive heart failure.  6.  History of restrictive lung disease/chronic obstructive pulmonary disease.  7.  Hyperlipidemia.  8.  Diabetes.  9.  Hypertension.  10.  History of anemia of chronic disease.  11.  History of tubal ligation.   CURRENT MEDICATIONS:  1.  Azithromycin 500 mg daily.  2.  Carvedilol 6.25 mg b.i.d.  3.  Lasix 40 mg daily.  4.  Guaifenesin 10 mL q.6 hours.  5.  Losartan 100 mg daily.  6.  Nitroglycerin patch 2% 1 inch b.i.d.  7.  Crestor 40 mg daily.  8.  The patient had 1 dose of vancomycin as well as 1 dose of Zosyn.  9.  Heparin drip.  10.  Aspirin 243 mg 1 tab given today.  11.  Morphine p.r.n.   SIGNIFICANT LABS AND IMAGING: Initial troponin was 4, next 9.3, next 11. Initial CK-MB was 12, next 15.1, next 15.4. Initial white count of 13.5, hemoglobin of 10, platelets of 183. Initial BUN was 36, creatinine of 8.39, sodium 131. BNP was 51,862. Glucose of 250. CT chest with contrast done showing patchy interstitial and alveolar infiltrates in both lungs are consistent with pneumonia, tiny pericardial effusion, stable pleural-based nodules in the right upper lobe which will merit followup.   HISTORY OF PRESENT ILLNESS AND HOSPITAL COURSE: For full  details of H and P, please see the dictation earlier today by Dr. Bridgette Habermann, but briefly this is a 59 year old with recent hospitalization for acute respiratory failure, diastolic CHF, COPD and atypical pneumonia who came in for shortness of breath, cough, worsening orthopnea. She had used her nebulizers x 2 without significant improvement and came into the hospital, again hypoxic requiring oxygen. Her initial O2 saturation was 86% on room air. Furthermore, she was noted to have a troponin of 4 with BNP of 51,000 or so, admitted to the hospitalist service for severe sepsis on arrival as she did have SIRS criteria of tachycardia, low-grade fevers, leukocytosis, and did have acute respiratory failure with pneumonia seen on the CAT scan. She was started on broad-spectrum antibiotics, oxygen and some cough medicine. She did have some new EKG changes of ST depression. Cardiology was consulted and the patient was given aspirin. Her statin was resumed as well as the beta blocker. Nitro patch was ordered and she was started on heparin drip. At that time, she did not have any pain in the chest. She did have some crackles in the lungs, and I believe her acute respiratory failure was likely secondary to pneumonia as seen on the CAT scan, more so than acute diastolic CHF, but I think both were present on arrival. Cultures were sent, but the patient did have a bump in her troponin from 4 to 9. The heparin was continued and she went to dialysis where she  did have worsening of shortness of breath, requiring nitroglycerin sublingual x 2 and nonrebreather. She was also given some morphine and cardiology came, saw her. Another EKG was performed and this second EKG showed improvement in the ST changes to me. Given the significant shortness of breath, dynamic ST changes and elevated troponin, which I suspect is non-ST elevation MI, possibly triggered from acute respiratory failure, pneumonia. The decision was made to urgently transfer  the patient to [facility] for an emergent catheterization. The patient was agreeable to the transfer. She had about 20 to 30 minutes of dialysis of note, and would also need dialysis if tolerated sooner than later as her dialysis days are Tuesday, Thursday and Saturday.   TOTAL CRITICAL CARE TIME SPENT: Today on admitting and followup and then discharging the patient to [facility] was 90 minutes.   The case was discussed with nephrology and cardiology in addition to patient.  ____________________________ Vivien Presto, MD sa:cs D: 05/28/2013 14:59:18 ET T: 05/28/2013 15:40:22 ET JOB#: [id number]  cc: Vivien Presto, MD, <Dictator> Jennifer A. Gilford Rile, MD Vivien Presto MD ELECTRONICALLY SIGNED 06/21/2013 11:04

## 2014-09-30 NOTE — Consult Note (Signed)
General Aspect Primary cardiologist: Dr. Rockey Situ  Theresa [name] is a pleasant 59 -year daily quickly-old woman with 25 years of smoking, diabetes, ESRD on HD, Moderate mitral regurgitation and CAD with NSTEMI in 05/2013 s/p RCA PCI and DES placement.  She has 40-59% bilateral carotid arterial disease as of September 2014   She presented with substernal chest pain as aching feeling with no radiation. This happened at the end of dialysis and lasted for about 30 minutes. She is different from prior MI which was manifested as dyspnea. She was very hypertensive.   Present Illness PMH: CAD s/p NSTEMI in 05/2013 s/p RCA PCI and DES placement, Tobacco abuse, ESRD on HD, HTN, HLD, DM, COPD, diabetic foot ulcer, chronic diastolic CHF, anemia of chronic disease,   PSH: Tubal ligation  SH: previous smoker. No alcohol or illicit drug use  FH: Father had CAD  All: PCN, hydrocodone   Physical Exam:  GEN no acute distress   HEENT Oropharynx clear   NECK supple   RESP clear BS  no use of accessory muscles   CARD Regular rate and rhythm  Normal, S1, S2  Murmur   Murmur Systolic   Systolic Murmur Out flow   ABD denies tenderness  soft  normal BS   EXTR negative edema   SKIN normal to palpation   NEURO cranial nerves intact   PSYCH alert, A+O to time, place, person   Review of Systems:  Subjective/Chief Complaint chest pain   General: No Complaints   Skin: No Complaints   ENT: No Complaints   Eyes: No Complaints   Neck: No Complaints   Respiratory: No Complaints   Cardiovascular: Chest pain or discomfort   Gastrointestinal: No Complaints   Genitourinary: No Complaints   Vascular: No Complaints   Musculoskeletal: No Complaints   Neurologic: No Complaints   Hematologic: No Complaints   Endocrine: No Complaints   Psychiatric: No Complaints   Family & Social History:  Family and Social History:  Family History Coronary Artery Disease   Social History negative  tobacco, previous tobacco use   Home Medications: Medication Instructions Status  Percocet 5/325 325 mg-5 mg tablet 1 tab(s) orally every 6 hours x 3 days, As Needed for pain that does not respond to ibuprofen Active  insulin detemir 100 units/mL subcutaneous solution 15 unit(s) subcutaneous once a day (at bedtime) Active  pregabalin 75 mg oral capsule 1 cap(s) orally once a day (at bedtime) Active  aspirin 81 mg oral tablet 1 tab(s) orally once a day Active  rosuvastatin 40 mg oral tablet 1 tab(s) orally once a day (at bedtime) Active  ProAir HFA CFC free 90 mcg/inh inhalation aerosol 1 puff(s) inhaled every 4 hours Active  albuterol 2.5 mg/3 mL (0.083%) inhalation solution 3 milliliter(s) inhaled every 6 hours, As Needed Active  Plavix 75 mg oral tablet 1 tab(s) orally once a day Active  carvedilol 12.5 mg oral tablet 1 tab(s) orally 2 times a day Active   Lab Results: LabObservation:  04-May-15 16:40   OBSERVATION Reason for Test  Hepatic:  04-May-15 11:35   Bilirubin, Total 0.5  Alkaline Phosphatase 79 (45-117 NOTE: New Reference Range 04/29/13)  SGPT (ALT) 19  SGOT (AST) 26  Total Protein, Serum 7.1  Albumin, Serum 3.5  Cardiology:  04-May-15 16:40   Echo Doppler REASON FOR EXAM:     COMMENTS:     PROCEDURE: [facility] - ECHO DOPPLER COMPLETE(TRANSTHOR)  - Oct 10 2013  4:40PM   RESULT: Echocardiogram  Report  Patient Name:   Theresa [name] Date of Exam: 10/10/2013 Medical Rec #:  [id number]            Custom1: Date of Birth:  08-16-1955         Height:       67.0 in Patient Age:    59 years          Weight:       168.0 lb Patient Gender: F                 BSA:          1.88 m??  Indications: SOB Sonographer:    Arville Go RDCS Referring Phys: Loletha Grayer, J  Summary:  1. Left ventricular ejection fraction, by visual estimation, is 55 to  60%.  2. Normal global left ventricular systolic function.  3. Moderate concentric left ventricular hypertrophy.  4.  Pseudonormal pattern of LV diastolic filling.  5. Mildly dilated left atrium.  6. Mild thickening of the anterior and posterior mitral valve leaflets.  7. Moderate mitral valve regurgitation.  8. Mild aortic valve sclerosis without stenosis. 2D AND M-MODE MEASUREMENTS (normalranges within parentheses): Left Ventricle:          Normal IVSd (2D):      1.07 cm (0.7-1.1) LVPWd (2D):     1.19 cm (0.7-1.1) Aorta/LA:                  Normal LVIDd (2D):     4.91 cm (3.4-5.7) Aortic Root (2D): 2.60 cm (2.4-3.7) LVIDs (2D):  3.25 cm           Left Atrium (2D): 4.20 cm (1.9-4.0) LV FS (2D):     33.8 %   (>25%) LV EF (2D):     62.5 %   (>50%)                                   Right Ventricle:                                   RVd (2D): LV DIASTOLIC FUNCTION: MV Peak E: 1.26 m/s Decel Time: 222 msec MV Peak A: 1.07 m/s E/A Ratio: 1.18 SPECTRAL DOPPLER ANALYSIS (where applicable): Mitral Valve: MV P1/2 Time: 64.38 msec MV Area, PHT: 3.42 cm?? Aortic Valve: AoV Max Vel: 2.01 m/s AoV Peak PG: 16.2 mmHg AoV Mean PG: LVOT Vmax: 1.20 m/s LVOT VTI:  LVOT Diameter: 1.90 cm AoV Area, Vmax: 1.69 cm?? AoV Area, VTI:  AoV Area, Vmn: Pulmonic Valve: PV Max Velocity: 1.24 m/s PV Max PG: 6.2 mmHg PV Mean PG:  PHYSICIAN INTERPRETATION: Left Ventricle: The left ventricular internal cavity size was normal.  Moderate concentric left ventricular hypertrophy. Global LV systolic  function was normal. Left ventricular ejection fraction, by visual  estimation, is 55 to 60%. Spectral Doppler shows pseudonormal pattern of  LV diastolic filling. Right Ventricle: Normal right ventricular size, wall thickness, and  systolic function. Left Atrium: The left atrium is mildly dilated. Right Atrium: The right atrium is normal in size and structure. Pericardium: There is no evidence of pericardial effusion. Mitral Valve: There is mild thickening of the anterior and posterior  mitral valve leaflets. No evidence of mitral  valve stenosis. Moderate  mitral valve regurgitation is seen. Tricuspid Valve: The tricuspid valve is normal. Trivial tricuspid  regurgitation is visualized. Aortic Valve: The aortic valve is tricuspid. Mild aortic valve sclerosis  is present, with no evidence of aortic valve stenosis. No evidence of  aortic valve regurgitation is seen. Pulmonic Valve: The pulmonic valve is not well seen. Trace pulmonic valve  regurgitation. Aorta: The aortic root is normal in size and structure. Venous: The inferior vena cava was normal. The inferior vena cava was  normal sized. [id number] Kathlyn Sacramento MD Electronically signed by [id number] Kathlyn Sacramento MD Signature Date/Time: 10/10/2013/6:07:04 PM  *** Final ***  IMPRESSION: .    Verified By: Mertie Clause. Chaney Ingram, M.D., MD  Routine Chem:  04-May-15 11:35   Glucose, Serum 79  BUN  27  Creatinine (comp)  6.78  Sodium, Serum 138  Potassium, Serum 4.2  Chloride, Serum 102  CO2, Serum 30  Calcium (Total), Serum 8.5  Osmolality (calc) 280  eGFR (African American)  7  eGFR (Non-African American)  6 (eGFR values <54m/min/1.73 m2 may be an indication of chronic kidney disease (CKD). Calculated eGFR is useful in patients with stable renal function. The eGFR calculation will not be reliable in acutely ill patients when serum creatinine is changing rapidly. It is not useful in  patients on dialysis. The eGFR calculation may not be applicable to patients at the low and high extremes of body sizes, pregnant women, and vegetarians.)  Anion Gap  6  Cardiac:  04-May-15 11:35   CK, Total 60 (26-192 NOTE: NEW REFERENCE RANGE  07/11/2013)  CPK-MB, Serum 1.5 (Result(s) reported on 10 Oct 2013 at 12:07PM.)  Troponin I < 0.02 (0.00-0.05 0.05 ng/mL or less: NEGATIVE  Repeat testing in 3-6 hrs  if clinically indicated. >0.05 ng/mL: POTENTIAL  MYOCARDIAL INJURY. Repeat  testing in 3-6 hrs if  clinically indicated. NOTE: An increase or decrease  of 30% or  more on serial  testing suggests a  clinically important change)    16:08   CK, Total 57 (26-192 NOTE: NEW REFERENCE RANGE  07/11/2013)  CPK-MB, Serum 1.3 (Result(s) reported on 10 Oct 2013 at 04:42PM.)  Troponin I 0.03 (0.00-0.05 0.05 ng/mL or less: NEGATIVE  Repeat testing in 3-6 hrs  if clinically indicated. >0.05 ng/mL: POTENTIAL  MYOCARDIAL INJURY. Repeat  testing in 3-6 hrs if  clinically indicated. NOTE: An increase or decrease  of 30% or more on serial  testing suggests a  clinically important change)  Routine Coag:  04-May-15 11:35   Prothrombin 13.0  INR 1.0 (INR reference interval applies to patients on anticoagulant therapy. A single INR therapeutic range for coumarins is not optimal for all indications; however, the suggested range for most indications is 2.0 - 3.0. Exceptions to the INR Reference Range may include: Prosthetic heart valves, acute myocardial infarction, prevention of myocardial infarction, and combinations of aspirin and anticoagulant. The need for a higher or lower target INR must be assessed individually. Reference: The Pharmacology and Management of the Vitamin K  antagonists: the seventh ACCP Conference on Antithrombotic and Thrombolytic Therapy. CEHUDJ.4970Sept:126 (3suppl): [id number] A HCT value >55% may artifactually increase the PT.  In one study,  the increase was an average of 25%. Reference:  "Effect on Routine and Special Coagulation Testing Values of Citrate Anticoagulant Adjustment in Patients with High HCT Values." American Journal of Clinical Pathology 2006;126:400-405.)  Activated PTT (APTT) 28.5 (A HCT value >55% may artifactually increase the APTT. In one study, the increase was an average of 19%. Reference: "Effect on Routine and Special Coagulation Testing Values of Citrate Anticoagulant Adjustment in  Patients with High HCT Values." American Journal of Clinical Pathology 2006;126:400-405.)  Routine Hem:  04-May-15 11:35    WBC (CBC) 9.8  RBC (CBC)  3.47  Hemoglobin (CBC) 12.2  Hematocrit (CBC) 37.0  Platelet Count (CBC) 181 (Result(s) reported on 10 Oct 2013 at 11:54AM.)  MCV  107  MCH  35.0  MCHC 32.9  RDW  16.9   EKG:  EKG Interp. by me  NSR   Interpretation LVH with repolarization abnormalities.   Radiology Results: Cardiology:    04-May-15 16:40, Echo Doppler  Echo Doppler   REASON FOR EXAM:      COMMENTS:       PROCEDURE: [facility] - ECHO DOPPLER COMPLETE(TRANSTHOR)  - Oct 10 2013  4:40PM     RESULT: Echocardiogram Report    Patient Name:   Theresa [name] Date of Exam: 10/10/2013  Medical Rec #:  [id number]            Custom1:  Date of Birth:  Dec 19, 1955         Height:       67.0 in  Patient Age:    53 years          Weight:       168.0 lb  Patient Gender: F                 BSA:          1.88 m??    Indications: SOB  Sonographer:    Arville Go RDCS  Referring Phys: Loletha Grayer, J    Summary:   1. Left ventricular ejection fraction, by visual estimation, is 55 to   60%.   2. Normal global left ventricular systolic function.   3. Moderate concentric left ventricular hypertrophy.   4. Pseudonormal pattern of LV diastolic filling.   5. Mildly dilated left atrium.   6. Mild thickening of the anterior and posterior mitral valve leaflets.   7. Moderate mitral valve regurgitation.   8. Mild aortic valve sclerosis without stenosis.  2D AND M-MODE MEASUREMENTS (normalranges within parentheses):  Left Ventricle:          Normal  IVSd (2D):      1.07 cm (0.7-1.1)  LVPWd (2D):     1.19 cm (0.7-1.1) Aorta/LA:                  Normal  LVIDd (2D):     4.91 cm (3.4-5.7) Aortic Root (2D): 2.60 cm (2.4-3.7)  LVIDs (2D):  3.25 cm           Left Atrium (2D): 4.20 cm (1.9-4.0)  LV FS (2D):     33.8 %   (>25%)  LV EF (2D):     62.5 %   (>50%)                                    Right Ventricle:                                    RVd (2D):  LV DIASTOLIC FUNCTION:  MV Peak E: 1.26 m/s Decel  Time: 222 msec  MV Peak A: 1.07 m/s  E/A Ratio: 1.18  SPECTRAL DOPPLER ANALYSIS (where applicable):  Mitral Valve:  MV P1/2 Time: 64.38 msec  MV Area, PHT: 3.42 cm??  Aortic Valve: AoV Max Vel: 2.01  m/s AoV Peak PG: 16.2 mmHg AoV Mean PG:  LVOT Vmax: 1.20 m/s LVOT VTI:  LVOT Diameter: 1.90 cm  AoV Area, Vmax: 1.69 cm?? AoV Area, VTI:  AoV Area, Vmn:  Pulmonic Valve:  PV Max Velocity: 1.24 m/s PV Max PG: 6.2 mmHg PV Mean PG:    PHYSICIAN INTERPRETATION:  Left Ventricle: The left ventricular internal cavity size was normal.   Moderate concentric left ventricular hypertrophy. Global LV systolic   function was normal. Left ventricular ejection fraction, by visual   estimation, is 55 to 60%. Spectral Doppler shows pseudonormal pattern of   LV diastolic filling.  Right Ventricle: Normal right ventricular size, wall thickness, and   systolic function.  Left Atrium: The left atrium is mildly dilated.  Right Atrium: The right atrium is normal in size and structure.  Pericardium: There is no evidence of pericardial effusion.  Mitral Valve: There is mild thickening of the anterior and posterior   mitral valve leaflets. No evidence of mitral valve stenosis. Moderate   mitral valve regurgitation is seen.  Tricuspid Valve: The tricuspid valve is normal. Trivial tricuspid   regurgitation is visualized.  Aortic Valve: The aortic valve is tricuspid. Mild aortic valve sclerosis   is present, with no evidence of aortic valve stenosis. No evidence of   aortic valve regurgitation is seen.  Pulmonic Valve: The pulmonic valve is not well seen. Trace pulmonic valve   regurgitation.  Aorta: The aortic root is normal in size and structure.  Venous: The inferior vena cava was normal. The inferior vena cava was   normal sized.  [id number] Kathlyn Sacramento MD  Electronically signed by [id number] Kathlyn Sacramento MD  Signature Date/Time: 10/10/2013/6:07:04 PM    *** Final ***    IMPRESSION: .        Verified By:  Mertie Clause. Kalyse Meharg, M.D., MD    Hydrocodone: Itching  Metformin: GI Distress  Vital Signs/Nurse's Notes: **Vital Signs.:   04-May-15 15:13  Vital Signs Type Admission  Temperature Temperature (F) 97.8  Celsius 36.5  Temperature Source oral  Pulse Pulse 86  Respirations Respirations 18  Systolic BP Systolic BP 828  Diastolic BP (mmHg) Diastolic BP (mmHg) 73  Mean BP 104  Pulse Ox % Pulse Ox % 96  Pulse Ox Activity Level  At rest  Oxygen Delivery Room Air/ 21 %    Impression 1. ATypical chest pain: different from prior MI. Could be muscloskeletal or due to uncontrolled hypertension.  TnI is normal so far and ECG does not show any ischemic changes.  Given known history of CAD, recommend a nuclear stress test tomorrow if TnI remains negative.   2. CAD with previous NSTEMI and DES placement to RCA: continue Aspirin and Plavix.   3. HTN: uncontrolled. Continue Coreg. Consider adding Amlodipine.   Electronic Signatures: Kathlyn Sacramento (MD)  (Signed [phone] 19:30)  Authored: General Aspect/Present Illness, History and Physical Exam, Review of System, Family & Social History, Home Medications, Labs, EKG , Radiology, Allergies, Vital Signs/Nurse's Notes, Impression/Plan   Last Updated: 04-May-15 19:30 by Kathlyn Sacramento (MD)

## 2014-09-30 NOTE — H&P (Signed)
PATIENT NAME:  Theresa [name], Theresa [name] MR#:  [id number] DATE OF BIRTH:  [birth year]  DATE OF ADMISSION:  05/12/2014  CHIEF COMPLAINT: Shortness of breath.   HISTORY OF PRESENT ILLNESS: A 59 year old female patient with ESRD on hemodialysis, hypertension, diabetes, comes in because of trouble breathing, which started suddenly this morning. The patient coughed up some blood this morning. He denies any chest pain. The patient felt nauseous today. He also has some chills. The patient's last dialysis was on Wednesday and she says she finished a full session of dialysis on Wednesday. The patient was found to have oxygen saturations 89% on room air when she came, and she was also noted to have elevated blood pressure 214/93, and also hyperkalemia with potassium of 6.4. The patient denies any other complaints. Her chest x-ray is positive for pneumonia. The patient was taken for urgent dialysis, for her malignant hypertension and hyperkalemia. Repeat blood pressure now is 165/58, heart rate 71.   PAST MEDICAL HISTORY:  Significant for:  1.  Type 2 diabetes mellitus with diabetic retinopathy, neuropathy, nephropathy.  2.  Long-standing hypertension.  3.  COPD. 4.  Hyperlipidemia.  5.  ESRD. 6.  Chronic diastole heart failure, EF 45k to 50%. 7.  History of Clostridium difficile colitis before.   ALLERGIES: HYDROCODONE AND METFORMIN.   SOCIAL HISTORY: No smoking, no drinking, no drugs. Lives alone.   PAST SURGICAL HISTORY: Significant for: History of hysterectomy, coronary artery disease with stent placement, follows up with Dr. Rockey Situ, history of AV graft placed in the left arm.   FAMILY HISTORY: Significant for: CAD and hypertension. Father had colon cancer.   REVIEW OF SYSTEMS:  CONSTITUTIONAL: No fevers. Had chills this morning and also some hemoptysis.  EYES: No blurred vision.  EARS, NOSE, AND THROAT: No tinnitus. No epistaxis. No difficulty swallowing.  RESPIRATORY: Has trouble breathing this morning.   CARDIOVASCULAR: No chest pain.  GASTROINTESTINAL: Has some nausea this morning; no vomiting.  ENDOCRINE: No polyuria or nocturia.  HEMATOLOGIC: He has no easy bruising.  GENITOURINARY: On dialysis.  MUSCULOSKELETAL: No joint pain.  NEUROLOGIC: No history of strokes.  PSYCHIATRIC: No anxiety or insomnia.  INTEGUMENTARY: No skin rashes.   MEDICATION: Percocet 5/325 mg every 8 hours as needed for pain, nebulizer with albuterol every 6 hours as needed for wheezing, aspirin 81 mg daily, calcium acetate 667 mg t.i.d., Coreg 12.5 mg p.o. b.i.d., Crestor 40 mg p.o. daily, Levemir 5 units at bedtime, Lyrica 75 mg once a day, Plavix 75 mg p.o. daily, Rena-Vite multivitamin 1 tablet daily.   PHYSICAL EXAMINATION:  VITAL SIGNS: Temperature 98.4, heart rate 81. Initial blood pressure 214/93; repeat blood pressure during dialysis 161/54, heart rate 71. He is alert, awake, oriented, complains of some headache.  HEAD: Atraumatic, normocephalic.  EYES: Pupils equal, reacting to light. Extraocular movements are intact.  EARS, NOSE, AND THROAT: No tympanic membrane congestion. No turbinate hypertrophy. No oropharyngeal erythema.  NECK: Supple. No JVD. No carotid bruit. Trachea in the midline.  LYMPHATIC: No lymphadenopathy.  CHEST: Symmetrical and atraumatic.  CARDIOVASCULAR: S1, S2 regular. No murmurs. The patient's PMI is not displaced. Peripheral pulses are equal in carotid and femoral and dorsalis pedis arteries.  LUNGS: Bilateral basilar crepitations present. Not using accessory muscles of respiration.  ABDOMEN: Soft, nontender nondistended. Bowel sounds present.  MUSCULOSKELETAL: Able to move extremities x 4. No cyanosis or clubbing. No joint effusion.  SKIN: No skin rashes.  NEUROLOGIC: Cranial nerves II through XII intact. Power 5/5 in upper and lower  extremities. Sensory intact. DTRs 2+ bilaterally.  PSYCHIATRIC: Mood and affect are within normal limits.   LABORATORY DATA: WBC 9.6, hemoglobin  12.2, hematocrit 38.9, platelets 133,000.  Electrolytes: Sodium is 140, potassium 6.4, chloride 102, bicarbonate 30, BUN 57, creatinine is 10.59, glucose 108. Phosphorus 4.6. Chest x-ray shows continued patchy opacity in the right base, suspicious for acute infection.  EKG shows normal sinus rhythm at 73 beats per minute; tall T-waves are present in V2, V3, V4, and V5.   ASSESSMENT AND PLAN:  1.  This is a 59 year old female with acute respiratory failure due to pneumonia and fluid overload. Admit her to the hospitalist service. I arranged for emergency hemodialysis. Right now, she is on room air, saturating 98%. The patient was admitted to telemetry for acute respiratory failure. She has received hemodialysis. For pneumonia, she is on antibiotics. Follow blood cultures. She had some cough and blood-tinged sputum, likely secondary to pneumonia, and her hemoglobin is stable. We will watch her closely for further symptoms, because she is on aspirin and Plavix as well, to see if there will be any hemoglobin drop.  2.  Hyperkalemia with EKG changes. Right now, she is getting her hemodialysis. Repeat potassium again, after the hemodialysis.  3.  End-stage renal disease on hemodialysis. Now has hyperkalemia and malignant hypertension. Needs to emergency hemodialysis. She is getting HD now, and blood pressure has improved with dialysis. Routine dialysis days are Monday/Wednesday/Friday. Nephrology is consulted for further dialysis needs.  4.  Type 2 diabetes mellitus. Continue her Levemir and also do sliding scale coverage. 6.  Headaches. She takes Percocet on and off, so I will restart that.  7.  Coronary artery disease, status post stent. Sees Dr. Rockey Situ. Right now, she is on aspirin, beta blockers, statins and Plavix. Restart them and monitor closely.   TIME SPENT: About 60 minutes.    ____________________________ Epifanio Lesches, MD sk:MT D: 05/12/2014 12:52:35 ET T: 05/12/2014 13:28:58  ET JOB#: [id number]  cc: Epifanio Lesches, MD, <Dictator> Minna Merritts, MD Eduard Clos Gilford Rile, MD Mun[place]or Lilian Kapur, MD Epifanio Lesches MD ELECTRONICALLY SIGNED 05/23/2014 23:27

## 2014-09-30 NOTE — Op Note (Signed)
PATIENT NAME:  Theresa [name], [name]:  [id number] DATE OF BIRTH:  1956-05-12  DATE OF PROCEDURE:  12/06/2013  PREOPERATIVE DIAGNOSES:  1. Complication of arteriovenous dialysis graft with prolonged bleeding.  2. End-stage renal disease requiring hemodialysis.   POSTOPERATIVE DIAGNOSES:  1. Complication of arteriovenous dialysis graft with prolonged bleeding.  2. End-stage renal disease requiring hemodialysis.  3. Central venous stenosis.  4.  PROCEDURES PERFORMED:  1. Contrast injection of left forearm loop graft.  2. Percutaneous transluminal angioplasty with stent placement of the venous anastomosis.  3. Percutaneous transluminal angioplasty of the central venous system subclavian vein.   PROCEDURE PERFORMED BY: Katha Cabal, [name]   SEDATION: Versed 5 mg plus fentanyl 200 mcg administered IV. Continuous ECG, pulse oximetry, and cardiopulmonary monitoring was performed throughout the entire procedure by the interventional radiology nurse. Total sedation time was 1 hour.   ACCESS:  1. A 6-French sheath, left forearm loop graft, retrograde direction.  2. A 7 French sheath, antegrade direction, left forearm loop graft.   CONTRAST USED: Isovue 46 mL.   FLUOROSCOPY TIME: 5.0 minutes.   INDICATIONS:  This patient is a 59 year old woman who has undergone creation of a left forearm loop graft. The patient has recently been having increased bleeding times from her graft and in fact is still oozing slightly from her puncture site from yesterday. Risks and benefits for angiography and intervention were reviewed. All questions were answered. The patient agrees to proceed.   DESCRIPTION OF PROCEDURE: The patient is taken to special procedures, placed in supine position. After adequate sedation is achieved, she was positioned supine with her left arm extended palm up.  Left arm was prepped and draped in a sterile fashion. Lidocaine 1% is infiltrated in the soft tissues and access is obtained to the  graft with a micropuncture needle, microwire followed by micro sheath, J-wire followed by a 6-French sheath. Hand injection of contrast demonstrates that this sheath is placed in a retrograde direction in the venous limb and it also demonstrates the venous outflow narrowing at the anastomosis and extending approximately  5-6 cm more proximally across the antecubital fossa.   Lidocaine 1% is infiltrated in the soft tissues and a sheath is started in the antegrade direction on the arterial limb. Hand injection of contrast now used to demonstrate the AV graft, which otherwise appears to be in excellent condition. The previously noted stricture is noted more proximally in the forearm. The brachial veins are widely patent. Axillary vein is widely patent. Subclavian vein appears to have a focal napkin ringlike narrowing at the confluence with the jugular vein. Innominate vein appears otherwise patent as is the superior vena cava.   Heparin 3000 units are given. Wire is advanced across the venous outflow and an 8 x 10 Viabahn stent is deployed beginning approximately 2 cm within the graft and extending across the venous anastomosis and across the antecubital fossa. It is post dilated with a 7 x 4 Dorado balloon. Both inflations are to 20 atmospheres. Followup imaging demonstrates an excellent result with rapid flow of contrast through this area now.   Attention is then turned to the lesion that is central. A magnified image of the confluence of the subclavian and jugular veins is obtained and subsequently a 10 x 2 balloon is advanced across the lesion. It is inflated to 14 atmospheres for 2 full minutes. Followup imaging demonstrates marked improvement in this area with less than 15% residual stenosis.   The sheaths are pulled after  pursestring sutures of Monocryl were placed and there were no immediate complications.   INTERPRETATION: As noted above, 2 strictures were identified; one at the anastomosis and  extending across the antecubital fossa, the other at the confluence of the subclavian and jugular veins. The anastomotic lesion is treated with a Viabahn stent and postdilated to 7 mm. The subclavian lesion is treated with balloon angioplasty alone.   SUMMARY: Successful salvage of left forearm loop AV graft as described above.      ____________________________ Katha Cabal, [name] ggs:dd D: 12/06/2013 12:07:02 ET T: 12/06/2013 19:06:42 ET JOB#: [id number]  cc: Katha Cabal, [name], <Dictator> Theresa [name], Theresa [name] ELECTRONICALLY SIGNED 12/13/2013 15:20

## 2014-09-30 NOTE — H&P (Signed)
PATIENT NAME:  Theresa [name], Theresa [name] MR#:  [id number] DATE OF BIRTH:  1956-04-03  DATE OF ADMISSION:  02/13/2014  REFERRING PHYSICIAN:  Gretchen Short. Beather Arbour, MD.  PRIMARY CARE PHYSICIAN:  Eduard Clos. Gilford Rile, MD.  ADMISSION DIAGNOSIS: Shortness of breath and hyperkalemia.   HISTORY OF PRESENT ILLNESS: This is a 59 year old African American female who presents to the Emergency Department with shortness of breath and chest tightness. The patient states that it began a few hours prior to arrival in the Emergency Department. She has had some nausea but no vomiting. Her chest pain does not radiate and she states that it is more consistent with tightness, most notably when she is trying to catch her breath. She he has had a heart attack in the past and states that it feels different than on that occasion. She states that she has not deviated from her renal diet and that she has been making her dialysis appointments as scheduled. Her last dialysis was 2 days ago. EKG showed that the patient had some peaked T-waves and her potassium came back as elevated, which prompted the Emergency Department staff to called for admission.   REVIEW OF SYSTEMS: CONSTITUTIONAL: The patient denies fever and weakness.  EYES: The patient denies diplopia or inflammation.  EARS, NOSE AND THROAT: Denies tinnitus or sore difficulty swallowing.  RESPIRATORY: The patient admits to shortness of breath and occasional cough, but denies wheezing.  CARDIOVASCULAR: The patient admits to chest pain, which she calls tightness, denies palpitations or orthopnea.  GASTROINTESTINAL: The patient admits to nausea but denies vomiting or abdominal pain.  ENDOCRINE: The patient denies polyuria or nocturia.  HEMATOLOGIC AND LYMPHATIC: The patient denies easy bruising or bleeding.  GENITOURINARY: The patient denies dysuria, increased frequency or hesitancy.  MUSCULOSKELETAL: The patient denies myalgias or arthralgias.  NEUROLOGIC: The patient denies numbness in  her extremities or dysarthria.  PSYCHIATRIC: The patient denies depression or suicidal ideation.  INTEGUMENTARY: The patient denies rashes or lesions.   PAST MEDICAL HISTORY: End-stage renal disease, COPD, coronary artery disease, diabetes mellitus type 2.   PAST SURGICAL HISTORY: The patient has had 1 coronary artery stent placement.   SOCIAL HISTORY: The patient denies smoking, drinking, or using illicit drugs. She lives alone.   FAMILY HISTORY: Significant for coronary artery disease and hypertension. Her father is deceased of colon cancer.   PERTINENT LABORATORY RESULTS AND RADIOGRAPHIC FINDINGS: Glucose is 143, BUN 74, creatinine 10.84. Potassium is 6.1. Serum albumin is 3.2. White blood cell count 11.9, hemoglobin 10, hematocrit 30.9, MCV is 108. Arterial blood gas shows a pH of 7.4, pCO2 of 44, PO2 of 124, and base excess of 2.7 on 60% FiO2. That was obtained while the patient was on BiPAP.   Chest x-ray shows some cardiomegaly with mild interstitial edema. There are some superimposed mild patchy, bilateral lower lobe infiltrates that likely represent atelectasis.   PHYSICAL EXAMINATION:  VITAL SIGNS: Temperature is 97.8, pulse 88, respirations 27, blood pressure 204/76, pulse oximetry 100% on Venturi mask.  GENERAL: The patient is alert and oriented x 3. She is in no apparent distress.  HEENT: Normocephalic, atraumatic. Pupils equal, round, and reactive to light and accommodation. Extraocular movements are intact. Mucous membranes are moist.  NECK: Trachea is midline. No adenopathy.  CHEST: Symmetric and atraumatic.  CARDIOVASCULAR: Regular rate and rhythm. Normal S1, S2. No rubs, clicks, or murmurs appreciated.  LUNGS: Clear to auscultation bilaterally with minimal increased effort. There is no use of accessory muscles.  ABDOMEN: Positive bowel sounds,  soft, nontender, nondistended. No hepatosplenomegaly.  GENITOURINARY: Deferred.  MUSCULOSKELETAL: The patient moves all 4  extremities equally.  SKIN: No rashes or lesions.  EXTREMITIES: No clubbing, cyanosis, or edema.  NEUROLOGIC: Cranial nerves II-XII grossly intact.  PSYCHIATRIC: Mood is normal and affect is congruent.   ASSESSMENT AND PLAN: This is a 59 year old female with hyperkalemia and shortness of breath.  1.  Shortness of breath likely due to mild interstitial edema on chest x-ray. Differential diagnosis includes acute coronary syndrome or pulmonary embolism. The patient's arterial blood gas was excellent. I have discontinued BiPAP and will add supplemental oxygen as needed. The patient will benefit from dialysis in the morning, which is her regularly scheduled day.  2.  Hyperkalemia. The patient has end-stage renal disease. She states that her potassium is usually not significantly elevated by her dialysis day. We suspect that over the holiday weekend, she may have ingested some food with more potassium in it than what she realizes. Her EKG showed some peaked T waves, so the ED shifted her potassium. We will schedule dialysis for the morning. A nephrology consult has been ordered.  3.  Chest pain. The patient describes the pain as tightness. I think that it is simply due to her intermittent tachypnea as well as her elevated blood pressure. She has no radiating pain or any diaphoresis. She is not tachycardic. I have a very low suspicion for pulmonary embolism at this time. She has no new ischemic changes on her EKG which does show Q waves in an anterior and inferior distribution consistent with her last heart attack. In light of the fact that she has normal troponins despite having end-stage renal disease I have not started a heparin drip at this time. We will continue medical management of her coronary artery disease with statin, aspirin,  and beta blocker therapy.  4.  Chronic obstructive pulmonary disease.  The patient was initially on BiPAP but she does not have problems with ventilation. We will continue the  patient's home regimen of inhalers and supply supplemental oxygen as needed to maintain oxygen saturations 88-94%.  5.  Diabetes type 2. Continue basal insulin coverage, add sliding scale insulin while the patient is hospitalized.  6.  Deep vein thrombosis prophylaxis with heparin and sequential compression devices.  7.  Gastrointestinal prophylaxis.  Will add a proton pump inhibitor to patient's regimen in order to reveal chest pain clearly without confusion for gastroesophageal reflux disease.   CODE STATUS: The patient is a full code.   Time spent on admission orders and patient care: Approximately 30 minutes.    ____________________________ Norva Riffle. Marcille Blanco, MD msd:lt D: 02/13/2014 04:49:45 ET T: 02/13/2014 07:01:23 ET JOB#: [id number]  cc: Norva Riffle. Marcille Blanco, MD, <Dictator> Norva Riffle Maebell Lyvers MD ELECTRONICALLY SIGNED 02/23/2014 0:34

## 2014-09-30 NOTE — Discharge Summary (Signed)
PATIENT NAME:  Theresa [name], Theresa [name] MR#:  [id number] DATE OF BIRTH:  1956/05/31  PRIMARY CARE PHYSICIAN:  Anderson Malta A. Gilford Rile, MD  DISCHARGE DIAGNOSES: 1.  Acute respiratory failure with fluid overload. 2.  Hyperkalemia.   3.  Malignant hypertension.  4.  End-stage renal disease. 5.  Coronary artery disease.  6.  Chronic diastolic congestive heart failure with ejection fraction 45%-50%.  7.  Diabetes, 2. 8.  Chronic obstructive pulmonary disease.  9.  Hyperlipidemia.   CONDITION:  Stable.   CODE STATUS: Full code.   HOME MEDICATIONS: Please refer to the medication reconciliation list.   DIET: Low-sodium, low-fat, low-cholesterol, ADA renal diet.   ACTIVITY: As tolerated.  FOLLOWUP CARE: With PCP and Dr. Holley Raring within 1-2 weeks.   REASON FOR ADMISSION:  Shortness of breath.   HOSPITAL COURSE: The patient is a 59 year old African American female with a history of hypertension, diabetes, COPD, ESRD, who presented to the ED with shortness of breath. She was found to have low O2 saturation at 89% on room air in ED.  Her blood pressure was 214/93. In addition, her potassium was 6.4. Chest x-ray showed pneumonia according to Dr. Vianne Bulls. For detailed history and physical examination, please refer to the admission note dictated by Dr. Vianne Bulls.   LABORATORY DATA: On admission date showed WBC 9.6, hemoglobin 12.2, BUN 57, creatinine 10.59, potassium 6.4.   1.  Acute respiratory failure possibly due to fluid overload. The patient got emergent hemodialysis. After dialysis, the patient's symptoms got better. 2.  For pneumonia, the patient was treated with antibiotics; however, CAT scan of chest did not show any evidence of pneumonia. CAT scan of the chest showed pulmonary edema or ARDS. The patient has no fever or chills. No leukocytosis, so the antibiotics were discontinued. The patient's acute respiratory failure improved after hemodialysis.  3.  For hyperkalemia, the patient potassium decreased to  6.0. The patient's potassium decreased to 5.6 after hemodialysis. For the past 2 days, she was treated with Kayexalate 1 dose this morning. We will get another hemodialysis today.   The patient's symptoms have much improved. She has no complaints of cough, shortness of breath or sputum. She is off oxygen. The patient will get hemodialysis today and then she will be discharged home today. The patient is clinically stable. I discussed the patient's discharge plan with the patient and the nurse.   TIME SPENT: About 41 minutes.    ____________________________ Demetrios Loll, MD qc:LT D: 05/14/2014 12:42:50 ET T: 05/14/2014 18:36:31 ET JOB#: [id number]  cc: Demetrios Loll, MD, <Dictator> Demetrios Loll MD ELECTRONICALLY SIGNED 05/15/2014 17:16

## 2014-09-30 NOTE — Discharge Summary (Signed)
PATIENT NAME:  Theresa [name], Theresa [name] MR#:  [id number] DATE OF BIRTH:  [birth year]  DATE OF ADMISSION:  02/13/2014 DATE OF DISCHARGE:  02/15/2014  PRIMARY CARE PHYSICIAN: Ronette Deter, MD.   PRESENTING COMPLAINT: Shortness of breath and elevated potassium.   DISCHARGE DIAGNOSES: 1. Acute pulmonary edema, resolved.  2. Hypokalemia, resolved.  3. End-stage renal disease on hemodialysis.  4. Hypertension.  5. Left elbow pain/bursitis. 6. Type 2 diabetes.   CODE STATUS: Full code.  MEDICATIONS:  1. Albuterol nebulizers 3 mL every 6 hours as needed.  2. Plavix 75 mg daily.  3. Carvedilol 12.5 mg b.i.d.  4. Insulin Detemir 15 units subcutaneously daily at bedtime. 5. crestor 40 mg daily at bedtime. 6. lyrica 75 mg daily. 7. Percocet 5/325 one tablet every 3 hours as needed. 8. Rena-Vite 1 tablet daily. 9. Protonix 20 mg daily. 10. Calcium acetate 667, two capsule 3 times daily. 11. Prednisone taper.   Low sodium, renal diet.   FOLLOW UP: With Dr. Ronette Deter in 1 to 2 weeks.  CONSULTATION: Nephrology consultation, Dr. Juleen China.   BRIEF SUMMARY OF HOSPITAL COURSE: [name] is a 59 year old, African-American female with past medical history of hypertension, diabetes and end-stage renal disease on hemodialysis, comes in with increasing shortness of breath and chest tightness. She was admitted with:  1. Acute respiratory failure with hypoxia secondary to pulmonary edema and volume overload. The patient was on BiPAP, now doing well on room air. Hemodialysis removed more than 4 liters of fluid. The patient is saturating well. She does have a history of COPD and her inhalers were continued. Chest x-ray showed no pneumonia. She completed a course of Zithromax last week.  2. Chest pain, likely secondary to pulmonary edema. Cardiac enzymes remained negative. Echo in May 2015 showed preserved EF of 50% to 55% with mild diastolic dysfunction. She is on Plavix and aspirin which was continued.   3. End-stage renal disease on hemodialysis.  4. Hypertension. Resumed home medications.  5. Type 2 diabetes on insulin detemir and sliding scale.  6. Left elbow pain/bursitis/DJD. The patient will receive a short course of steroids and p.r.n. pain meds.   Hospital stay otherwise remained stable. The patient remained a full code.   TIME SPENT: 40 minutes.     ____________________________ Hart Rochester Posey Pronto, MD sap:TT D: 02/15/2014 13:44:56 ET T: 02/15/2014 15:31:49 ET JOB#: [id number]  cc: Faizah Kandler A. Posey Pronto, MD, <Dictator> Ilda Basset MD ELECTRONICALLY SIGNED 02/16/2014 14:06

## 2014-09-30 NOTE — H&P (Signed)
PATIENT NAME:  Theresa [name], Theresa [name] MR#:  [id number] DATE OF BIRTH:  [birth year]  DATE OF ADMISSION:  06/14/2013  PRIMARY CARE PHYSICIAN: Anderson Malta A. Gilford Rile, MD  REFERRING PHYSICIAN: Verdia Kuba. Paduchowski, MD  CHIEF COMPLAINT: Diarrhea 3 days.   HISTORY OF PRESENT ILLNESS: This 59 year old African American female with a history of MI, CAD, respiratory failure, pneumonia, sepsis, was sent to ED due to diarrhea for 3 days. The patient comes from home. The patient was admitted for non-STEMI last December, was sent to [facility] for cardiac cath, and she is status post stent. She had pneumonia there and was treated with antibiotics. For the past 3 days, she developed severe diarrhea, nausea, vomiting. Diarrhea is watery and 20 times a day. The patient feels fever, chills, weak and poor appetite, but the patient denies any cough, shortness of breath. No chest pain, palpitations, orthopnea, nocturnal dyspnea. No leg edema. The patient denies any melena or bloody stool, but she complains of abdominal cramps in the right lower part. The patient had dialysis today, and Dr. Candiss Norse sent the patient to ED for admission due to persistent diarrhea and weakness.   PAST MEDICAL HISTORY: Non-STEMI, status post stent, pneumonia, CHF, COPD, severe sepsis, hyperlipidemia, hypertension, anemia of chronic disease, diabetes, ESRD.   PAST SURGICAL HISTORY: Tubal ligation.   SOCIAL HISTORY: The patient quit smoking many years ago. No alcohol drinking or illicit drugs.   FAMILY HISTORY: Father had a heart attack. Mother had colon cancer.   ALLERGIES: HYDROCODONE.   HOME MEDICATIONS: 1.  Sertraline 50 mg p.o. daily at bedtime. 2.  Crestor 40 mg p.o. at bedtime.  3.  ProAir HFA CFC-free 90 mcg inhalation 1 puff every 4 hours.  4.  Pregabalin 50 mg p.o. 2 caps once a day.  5.  Plavix 75 mg p.o. daily.  6.  Nasonex 50 mcg inhalation 2 sprays once a day.  7.  Losartan 100 mg p.o. once a day.  8.  Lasix 40 mg p.o. daily. 9.   Coreg 12.5 mg p.o. b.i.d.  10.  DuoNeb 3 mL inhaled every 6 hours p.r.n.   REVIEW OF SYSTEMS:    CONSTITUTIONAL: Positive for fever, chills. No headache or dizziness, but has generalized weakness.  EYES: No double vision, blurry vision.  EARS, NOSE, THROAT: No postnasal drip, slurred speech or dysphagia.  CARDIOVASCULAR: No chest pain, palpitations, orthopnea or nocturnal dyspnea. No leg edema.  PULMONARY: No cough, sputum, shortness of breath or hemoptysis.  GASTROINTESTINAL: Positive for abdominal cramps, nausea, vomiting, diarrhea (watery). No melena or bloody stool.  GENITOURINARY: No dysuria, hematuria or incontinence.  SKIN: No rash or jaundice.  NEUROLOGIC: No syncope, loss of consciousness or seizure.  HEMATOLOGIC: No easy bruising or bleeding.  ENDOCRINE: No polyuria, polydipsia, heat or cold intolerance.   PHYSICAL EXAMINATION: VITAL SIGNS: Temperature 99.5, blood pressure 139/62, pulse 83, respirations 18, O2 saturation 99% on room air.  GENERAL: The patient is alert, awake, oriented, in no acute distress.  HEENT: Pupils round, equal, reactive to light and accommodation. Moist oral mucosa. Clear oropharynx.  NECK: Supple. No JVD or carotid bruits. No lymphadenopathy. No thyromegaly.  CARDIOVASCULAR: S1, S2, regular rate, rhythm. No murmurs or gallops.  PULMONARY: Bilateral air entry. No wheezing or rales. No use of accessory muscles to breathe.  ABDOMEN: Soft. No distention, but has mild tenderness in the right lower quadrant. No rigidity. No rebound. No organomegaly. Bowel sounds present.  EXTREMITIES: No edema, clubbing or cyanosis. No calf tenderness. Bilateral pedal pulses  present.  NEUROLOGIC: A and O x 3. No focal deficits. Power 5/5. Sensation intact.   LABORATORY, DIAGNOSTIC AND RADIOLOGICAL DATA: CAT scan of abdomen and pelvis showed no significant abnormality in the abdomen and pelvis.   Glucose 99, BUN 16, creatinine 4.65, sodium 132, potassium 3.3, bicarbonate 32,  chloride 97. Lipase 107. WBC 6.5, hemoglobin 12.1, platelets 173. Liver function tests are normal. Troponin 0.03.   EKG showed normal sinus rhythm at 82 BPM.   IMPRESSIONS: 1.  Diarrhea. Need to rule out Clostridium difficile colitis.  2.  End-stage renal disease, on dialysis.  3.  Hypokalemia.  4.  Hyponatremia.  5.  Coronary artery disease.  6.  Congestive heart failure.  7.  Chronic obstructive pulmonary disease.  8.  Hypertension. 9.  Diabetes.   PLAN OF TREATMENT: 1.  The patient will be admitted to medical floor. We will start Cipro, Flagyl, and Zofran p.r.n. We will check C. difficile test and stool culture.  2.  Continue dialysis. Nephrology consult.  3.  Continue home medications.  4.  I discussed the patient's condition and plan of treatment with the patient, the patient's son and daughter.   TIME SPENT: About 55 minutes.   ____________________________ Demetrios Loll, MD qc:jcm D: 06/14/2013 16:13:19 ET T: 06/14/2013 16:43:25 ET JOB#: [id number]  cc: Demetrios Loll, MD, <Dictator> Demetrios Loll MD ELECTRONICALLY SIGNED 06/20/2013 14:41

## 2014-09-30 NOTE — H&P (Signed)
PATIENT NAME:  Theresa [name], Theresa [name] MR#:  [id number] DATE OF BIRTH:  09/25/1955  DATE OF ADMISSION:  10/10/2013  PRIMARY CARE PHYSICIAN: Anderson Malta A. Gilford Rile, MD  NEPHROLOGIST: Murlean Iba, MD  DIALYSIS: At [place], [place].   CARDIOLOGIST: Minna Merritts, MD  CHIEF COMPLAINT: Chest pain.   HISTORY OF PRESENT ILLNESS: This is a 60 year old female who was at dialysis today. She received most of her dialysis. Within the last 30 minutes of dialysis, she vomited 4 times, developed chest pain, left side, squeezing in nature, 8 out of 10 in intensity, radiating to the left arm. Nothing made it better or worse. They gave her nitroglycerin. Her blood pressure dipped down low. Her blood pressure was initially very high. Currently in the ER, she is having 4 out of 10 chest pain. They gave morphine and 2 more nitroglycerin pills. Paramedics did give 4 baby aspirin. In the ER, still having pain. Troponin was negative. EKG just showed left atrial enlargement and left ventricular hypertrophy. Hospitalist services were contacted for further evaluation.   PAST MEDICAL HISTORY: Diabetes, end-stage renal disease on dialysis, congestive heart failure, coronary artery disease, hypertension, C. difficile in the past, neuropathy, arthritis, hyperlipidemia.   PAST SURGICAL HISTORY: Four eye surgeries, hysterectomy, and left arm graft.   ALLERGIES: LISTED AS HYDROCODONE AND METFORMIN.  MEDICATIONS: Include Crestor 40 mg daily, ProAir CFC one inhalation daily, pregabalin 75 mg at bedtime, Plavix 75 mg daily, Percocet 5/325 one tablet as needed for pain, Levemir 15 units subcutaneous at bedtime, Coreg 12.5 mg listed as twice a day, aspirin 81 mg daily, DuoNeb nebulizer solution p.r.n. shortness of breath.   SOCIAL HISTORY: No smoking. No alcohol. No drug use. Is on disability. Lives with son and grandchild.   FAMILY HISTORY: Father died of colon cancer. Mother died of an MI at age 56. Brother died at age 37 of an MI.  Brother died of diabetic complications.  REVIEW OF SYSTEMS:    CONSTITUTIONAL: Positive for chills. No sweats. No fever. Positive for weight going up and down. Positive for fatigue.  EYES: She does wear glasses.  EARS, NOSE, THROAT: Has runny nose. No sore throat. No difficulty swallowing.  CARDIOVASCULAR: Positive for chest pain. No palpitations.  RESPIRATORY: Occasional shortness of breath. No cough. No sputum. No hemoptysis.  GASTROINTESTINAL: Positive for nausea. Positive for vomiting. No hematemesis. No abdominal pain. No diarrhea or constipation. No bright red blood per rectum. No melena.  GENITOURINARY: No burning on urination. No hematuria.  MUSCULOSKELETAL: Positive for arthritis, back pain, and neuropathy.   NEUROLOGIC: No fainting or blackouts.  PSYCHIATRIC: No anxiety or depression.  ENDOCRINE: No thyroid problems.  HEMATOLOGIC AND LYMPHATIC: No anemia.   PHYSICAL EXAMINATION: VITAL SIGNS: On presentation to the ER included a temperature of 99, pulse 72, respirations 20, blood pressure 222/82, pulse oximetry 100% (that was on 4 liters).  GENERAL: No respiratory distress.  EYES: Conjunctivae and lids normal. Pupils equal, round, and reactive to light. Extraocular muscles intact. No nystagmus.  EARS, NOSE, MOUTH, AND THROAT: Tympanic membranes: No erythema. Nasal mucosa: No erythema. Throat: No erythema. No exudate seen. Lips and gums: No lesions.  NECK: No JVD. No bruits. No lymphadenopathy. No thyromegaly. No thyroid nodules palpated.  LUNGS: Clear to auscultation. No use of accessory muscles to breathe. No rhonchi, rales, or wheeze heard.  CARDIOVASCULAR: S1, S2 normal. No gallops, rubs, or murmurs heard. Carotid upstroke 2+ bilaterally. No bruits. Dorsalis pedis pulses 1+ bilaterally. Pulses equal throughout upper and lower extremity.  CHEST WALL: Positive pain, left parasternal area.  ABDOMEN: Soft, nontender. No organomegaly/splenomegaly. Normoactive bowel sounds. No masses  felt.  LYMPHATIC: No lymph nodes in the neck.  MUSCULOSKELETAL: No clubbing, edema, or cyanosis.  SKIN: No ulcers or lesions seen.  NEUROLOGIC: Cranial nerves II through XII grossly intact. Deep tendon reflexes 1+ bilateral lower extremities.  PSYCHIATRIC: The patient is oriented to person, place, and time.   LABORATORY AND RADIOLOGICAL DATA: EKG: Normal sinus rhythm, 75 beats per minute, left atrial enlargement, left ventricular hypertrophy. Troponin negative at 0.02. CPK 60. PT, INR, PTT: Normal range. White blood cell count 9.8, hemoglobin and hematocrit 12.2 and 37.0, platelet count of 181. Glucose 79, BUN 27, creatinine 6.78, sodium 138, potassium 4.2, chloride 102, CO2 of 30, calcium 8.5. Liver function tests: Normal range. Chest x-ray showed no acute cardiopulmonary disease; aortic atherosclerosis, age advanced.   ASSESSMENT AND PLAN: 1.  Chest pain with history of coronary artery disease: The patient is on good cardiac medications including aspirin, Plavix, Crestor, and Coreg. I did speak with Dr. Donivan Scull associate, Dr. Fletcher Anon, who will come evaluate the patient, review previous testing, and decide based on the next sets of cardiac enzymes on whether to do a stress test or cardiac cath. With the first set of heart enzymes being negative, I am hopeful that the patient's chest pain is secondary to malignant hypertension or musculoskeletal since it is reproducible, but with her history of coronary artery disease, we will get serial cardiac enzymes to rule out myocardial infarction and admit to telemetry.  2.  Malignant hypertension: The patient does not take her medications before dialysis. We will give a stat dose of Coreg and add nitro paste and continue to monitor blood pressure closely and add medications as needed.  3.  Hyperlipidemia: On Crestor. Check a lipid profile in the a.m.  4.  End-stage renal disease, on hemodialysis: Received most of hemodialysis today except for the last 30 minutes.  I think the patient can probably go to the next dialysis session without the need of dialysis while here in the hospital.  5.  History of congestive heart failure: No current signs of congestive heart failure at this point.  6.  Diabetes with neuropathy: I will decrease the dose of Levemir to 7 units at bedtime since the patient will probably have some sort of n.p.o. procedure tomorrow. Continue Lyrica for the neuropathy.   TIME SPENT ON ADMISSION: 55 minutes.    ____________________________ Tana Conch. Leslye Peer, MD rjw:jcm D: 10/10/2013 13:20:24 ET T: 10/10/2013 13:35:50 ET JOB#: [id number]  cc: Tana Conch. Leslye Peer, MD, <Dictator> Eduard Clos. Gilford Rile, MD Murlean Iba, MD [place] Minna Merritts, MD     Marisue Brooklyn MD ELECTRONICALLY SIGNED 10/16/2013 14:45

## 2014-09-30 NOTE — Discharge Summary (Signed)
PATIENT NAME:  Theresa [name], SPELL MR#:  [id number] DATE OF BIRTH:  Apr 27, 1956  DATE OF ADMISSION:  06/14/2013 DATE OF DISCHARGE:  06/16/2013   ADMITTING PHYSICIAN: Demetrios Loll, MD  DISCHARGING PHYSICIAN: Gladstone Lighter, MD  PRIMARY CARE PHYSICIAN: Eduard Clos. Gilford Rile, MD  [place]: None.   DISCHARGE DIAGNOSES: 1.  Clostridium difficile colitis.  2.  End-stage renal disease, on Monday, Wednesday, Friday hemodialysis.  3.  Hypertension.  4.  Diabetes mellitus.  5.  Coronary artery disease, status post recent stent in December 2014 for non-ST segment elevation myocardial infarction.  6.  Diastolic congestive heart failure, well- compensated.  7.  History of restrictive lung disease/chronic obstructive pulmonary disease.  8.  Anemia of chronic disease.  9.  Severe sepsis with bilateral pneumonia in December 2014, treated at [facility].  DISCHARGE HOME MEDICATIONS:  1.  Sertraline 50 mg p.o. at bedtime.  2.  Losartan 100 mg p.o. daily.  3.  Lasix 40 mg p.o. daily.  4.  Nasonex 50 mcg nasal spray 2 sprays once a day.  5.  Lovastatin 40 mg p.o. at bedtime.  6.  ProAir inhaler q.4 hours p.r.n.  7.  Albuterol nebulizer 3 mL q.6 hours p.r.n.  8.  Plavix 75 mg p.o. daily.  9.  Pregabalin 100 mg p.o. daily.  10.  Coreg 12.5 mg p.o. b.i.d.  11.  Flagyl 500 mg p.o. q.8 hours for 9 more days.  12.  Lantus 15 units subcutaneous at bedtime.  13.  Dicyclomine 10 mg q.8 hours p.r.n. for abdominal pain.   DISCHARGE DIET: Renal diet.   DISCHARGE ACTIVITY: As tolerated.    FOLLOWUP INSTRUCTIONS:  1.  PCP followup in 1 to 2 weeks.  2.  Follow up for next dialysis as per schedule. 3.  Cardiology followup as prior scheduled.   LABORATORY AND IMAGING STUDIES PRIOR TO DISCHARGE: Troponins x 2 remain negative. WBC 4.1, hemoglobin 10.4, hematocrit 31.4, platelet count 145. HbA1c is 5.7. TSH 1.19. Magnesium 1.8 Clostridium difficile test showing antigen positivity and negative for toxins.  Stool cultures negative for other bacteria.   CT of the abdomen and pelvis showing rounded density in the anterior subcutaneous tissues of pelvis and lower abdomen, which may present injection granulomas as the patient injects insulin. No other significant acute abnormality noted in the pelvis or abdomen.   Sodium 132, potassium 3.3, chloride 97, bicarbonate 32, BUN 16, creatinine 4.65, glucose 99 and calcium of 8.3. Lipase is normal at 107.   BRIEF HOSPITAL COURSE: [name] is a 59 year old African American female with past medical history significant for recent hospitalization at [facility] in December 2014 for NSTEMI and was shipped over to [facility] for stent placement, recent sepsis and bilateral pneumonia during the last hospitalization, diastolic CHF, COPD, hyperlipidemia, hypertension, end-stage renal disease on hemodialysis, was sent in from dialysis center because of worsening diarrhea, nausea, vomiting, and abdominal pain.  1.  Acute gastroenteritis: Likely initially thought to be viral gastroenteritis. However, C. difficile test came indeterminate, as it was antigen positive and toxin negative. Discussed with Valeta Harms, from infectious diseases, and continued her on contact isolation and with her risk factors being recent exposure to broad-spectrum antibiotics for her pneumonia last month and with the watery diarrhea, she is being empirically started on Flagyl. That improved her symptoms, so she is being discharged on Flagyl to finish up a 10-day course. The patient still had minimal right lower quadrant abdominal pain. CT of the abdomen did not show  any acute findings. She is being discharged on Bentyl, as her pain is more cramping and associated with the diarrhea. It could be still viral diarrhea.  2.  End-stage renal disease: On hemodialysis, last dialysis was Friday prior to discharge. Otherwise, she has been stable from nephrology standpoint.  3.  Hypertension: All of her home medications are  being continued.  4.  Diabetes mellitus: The patient had hypoglycemic episodes with morning blood sugars in the 50 range, so her long-acting insulin at bedtime was dose decreased from 20 units to 15 units prior to discharge.  5.  CAD with recent stent in December 2014: The patient is on Plavix, statin, Coreg, and losartan. She will follow up with cardiology as prior scheduled.  6.  Her course has been otherwise uneventful in the hospital.   DISCHARGE CONDITION: Stable.   DISCHARGE DISPOSITION: Home.  TIME SPENT ON DISCHARGE: 45 minutes.  ____________________________ Gladstone Lighter, MD rk:jcm D: 06/16/2013 15:36:21 ET T: 06/16/2013 21:21:29 ET JOB#: [id number]  cc: Gladstone Lighter, MD, <Dictator> Eduard Clos. Gilford Rile, MD Gladstone Lighter MD ELECTRONICALLY SIGNED 06/21/2013 15:16

## 2014-09-30 NOTE — H&P (Signed)
PATIENT NAME:  Theresa [name], Theresa [name] MR#:  [id number] DATE OF BIRTH:  05-15-1956  DATE OF ADMISSION:  05/28/2013  REFERRING PHYSICIAN: Latina Craver, MD  PRIMARY CARE PHYSICIAN: Eduard Clos. Gilford Rile, MD  PRIMARY CARDIOLOGIST: Minna Merritts, MD   CHIEF COMPLAINT: Shortness of breath.   HISTORY OF PRESENT ILLNESS: The patient is a pleasant 59 year old African-American female with recent hospitalization, who was discharged on the 9th of this month, for shortness of breath, respiratory failure and thought she likely had atypical pneumonia, COPD flare and diastolic CHF, who presents with pretty similar presentation. She stated that she did feel better after that discharge, took antibiotic for the suspected atypical pneumonia. About 2 days ago, started to have a cough again, which was productive. She has had increased shortness of breath, orthopnea, 2-pillow, some chills yesterday. She has had 2 bouts of nebulizers, and it has not helped. She came into the hospital and was noted to have again hypoxia, with O2 saturation of 86% on room air. Currently, she is on oxygen. She was also noted to have troponin of 4, with BNP of 51,000. Of note, at last admission on December 4th, it was 21,000. Today was actually her dialysis day, but given her increased shortness of breath, she did not undergo dialysis and instead came here.   PAST MEDICAL HISTORY:  1. End-stage renal disease, on dialysis.  2. Hypertension.  3. Diabetes, insulin-dependent.  4. Hyperlipidemia.  5. Restrictive lung disease/COPD.  6. History of diabetic foot ulcer in the past.  7. Chronic diastolic CHF.  8. Anemia of chronic disease.  9. Recent hospitalization for acute respiratory failure.  10. Tubal ligation.   SOCIAL HISTORY: She states she has stopped smoking. No drugs or alcohol.   FAMILY HISTORY: Father with a heart attack. Mom had colon cancer.   ALLERGIES: PENICILLIN, PER CHART; HOWEVER, HYDROCODONE COULD ALSO BE AN ALLERGY.    REVIEW OF SYSTEMS:  CONSTITUTIONAL: Positive fatigue, weakness, chills last night and yesterday.  EYES: Has some blurry vision.  ENT: No tinnitus or hearing loss. Just has dry throat.  RESPIRATORY: Positive for shortness of breath, productive cough. No wheezing. Has also some pleuritic chest pain with deep respirations.  CARDIOVASCULAR: No left-sided chest pain. Has some increased swelling in the legs. Has history of diastolic CHF.  GASTROINTESTINAL: Positive for nausea. No vomiting, diarrhea, bloody stools or dark stools.  GENITOURINARY: Denies dysuria or hematuria.  MUSCULOSKELETAL: Has some back pain with the coughing episodes.  SKIN: No rashes.  NEUROLOGIC: No polyuria or nocturia.  HEMATOLOGIC AND LYMPHATIC: Has chronic anemia.  NEUROLOGIC: No focal weakness, numbness, history of stroke or seizures.  PSYCHIATRIC: No anxiety or depression.   PHYSICAL EXAMINATION:  VITAL SIGNS: Temperature on arrival noted to be 99.4, pulse rate 114, respiratory rate 28, blood pressure 145/72, O2 saturation was 100% on oxygen.  GENERAL: The patient is an ill-appearing African-American female lying in bed. HEENT: Normocephalic, atraumatic. Pupils are equal and reactive. Anicteric sclerae. Dry mucous membranes.  NECK: Supple. No thyroid tenderness. No cervical lymphadenopathy.  CARDIOVASCULAR: S1, S2, tachycardic. No significant murmurs appreciated.  LUNGS: Bilateral mid lungs and below crackles. No significant wheezing. Good air entry in the upper lungs bilaterally.  ABDOMEN: Mild epigastric tenderness to deep palpation. Soft. No rebound or guarding.  EXTREMITIES: The patient has 1+ pitting edema.  NEUROLOGIC: Cranial nerves II through XII grossly intact. Strength is 5 out of 5 in all extremities. Sensation intact to light touch.  PSYCHIATRIC: Awake, alert and oriented x3,  cooperative. SKIN: No obvious rashes or lesions.  LABORATORY DATA: Glucose is 250. BNP [id number]. BUN 36, creatinine is 8.39,  sodium is 131, potassium 6, albumin is 2.5, AST 74. Troponin is 4.4, CK-MB is 12.6, a white count of 13.5, hemoglobin 10. EKG: To me, there appear to be some ST depressions in lead II, V5, V6,  possible ST elevation in V3, could be repolarization abnormality. X-ray of the chest: Multifocal bilateral airspace disease consistent with pneumonia in appropriate clinical setting, may be also small effusions as well.   ASSESSMENT AND PLAN: We have a pleasant 59 year old female with recent hospitalization for acute respiratory failure, who was just discharged on the 9th of December, for suspected atypical pneumonia, diastolic congestive heart failure and chronic obstructive pulmonary disease, who presents again with acute respiratory failure, requiring oxygen, and systemic inflammatory response syndrome criteria possible sepsis. The patient has leukocytosis, low-grade fever, tachycardia, tachypnea and acute respiratory failure with possible pneumonia. Will admit the patient to the hospital on telemetry given the positive troponin and EKG changes.   In regards to the acute respiratory failure, I suspect this is likely multifactorial. I believe the patient possibly has pneumonia. It is possible that the previous pneumonia was partially treated or this is perhaps a new pneumonia, but I suspect partial treatment of the previous pneumonia is more likely, in addition to acute diastolic congestive heart failure as the patient has some lower extremity edema, crackles in the lungs in addition to elevated BNP, much higher than previous. I have discussed the case with nephrology, who would come in and dialyze her. I would start her on vancomycin, Zosyn and Levaquin. Obtain sputum cultures. Blood cultures have already been sent. I would check urine strep and Legionella antigens as well. Provide her oxygen and some cough medicine.   In addition to the positive troponin, this could be non-ST elevation myocardial infarction, or  alternatively, and more likely, demand ischemia in the setting of acute respiratory failure and the lung findings. The patient has no chest pain, but given the troponin of 4 with some EKG changes, there is a concern for non-ST elevation myocardial infarction. I would cycle the troponins, start the patient on aspirin, heparin drip, admit her to telemetry and cycle the troponins. Obtain a cardiology consult and resume the beta blocker and statin. Again, she does not have left-sided chest pain. However, she is a diabetic, has multiple risk factors otherwise and is a high-risk candidate for developing premature coronary artery disease. Would also start her on some nitroglycerin patch and provide morphine p.r.n.   In regards to her diabetes, I would continue insulin, add sliding scale insulin as well.   In regards to the end-stage renal disease, I have discussed the case with nephrology, who would also come in and do dialysis today.   In regards to the lung findings, they are discussed above. I would obtain a CAT scan with contrast for better visualization of the lung parenchyma. I would start her on some albuterol nebulizers around the clock for her chronic obstructive pulmonary disease. She does not appear to have an acute chronic obstructive pulmonary disease flare at this point.   CODE STATUS: The patient is full code.   TOTAL CRITICAL CARE TIME SPENT: 55 minutes.    ____________________________ Vivien Presto, MD sa:lb D: 05/28/2013 08:52:54 ET T: 05/28/2013 09:10:06 ET JOB#: [id number]  cc: Vivien Presto, MD, <Dictator> Vivien Presto MD ELECTRONICALLY SIGNED 06/21/2013 11:04

## 2014-09-30 NOTE — Consult Note (Signed)
Details:   - GI Note:  I have seen Theresa [name] and agree with Theresa [name] a/p.   She has long standing hx of intermittent episodes of n/v followed by diarrhea.  She sees Theresa [name] in [place] for this.   She appear to be getting better currently. No vomiting in past few hours.    Recs: - cont scheduled phenergan - clear liquds, advance as tolerated. - no indication for EGD currently.  - will need to f/u with her outpatient gastroenterologist Theresa [name].   Electronic Signatures: Arther Dames (MD)  (Signed [phone] 18:14)  Authored: Details   Last Updated: 05-May-15 18:14 by Arther Dames (MD)

## 2014-09-30 NOTE — Consult Note (Signed)
PATIENT NAME:  Theresa [name], RENDER MR#:  [id number] DATE OF BIRTH:  1955/10/18  DATE OF CONSULTATION:  10/11/2013  REFERRING PHYSICIAN:  Dr. Posey Pronto CONSULTING PHYSICIAN:  Arther Dames, MD / Corky Sox. Nydia Ytuarte, PA-C  REASON FOR CONSULTATION: Persistent nausea and vomiting.   HISTORY OF PRESENT ILLNESS: This is a pleasant 59 year old female who was initially admitted after she began experiencing episodes of vomiting and chest pain in the middle of dialysis. She had about 30 minutes left of her dialysis session when the symptoms began. She describes the pain initially as being located in the left side of her chest radiating down her left arm. She was given nitroglycerin and morphine and complete cardiac workup was initiated. She has had EKG, echocardiogram and serial cardiac enzymes and thus far has been unable to identify an etiology. Cardiology has seen the patient and this is likely a noncardiac chest pain. Today, she does admit that her chest pain has significantly improved but unfortunately she is still feeling extremely nauseous and having some vomiting. She has been n.p.o. today but continues to dry heave. She does have a remarkable cardiac history with diastolic congestive heart failure and coronary artery disease with a recent non-STEMI in December of 2014 status post PCI. She does also have a history of GERD and currently is denying any accompanying reflux symptoms other than the nausea and vomiting. There is no heartburn, indigestion, dysphagia or abdominal pain. She is moving her bowels on a regular basis and denies any diarrhea, constipation, bright red blood per rectum or melena. White blood cells and liver enzymes are normal currently. She is a diabetic and does utilize insulin for control of her sugars. Currently her glucose is 79.   PAST MEDICAL HISTORY: Diabetes mellitus, coronary artery disease, hypertension, diastolic congestive heart failure, end-stage renal disease on hemodialysis, recent non-STEMI  in December 2014 status post PCI, history of C. diff, diabetic neuropathy, osteoarthritis, dyslipidemia, COPD.  PAST SURGICAL HISTORY: Multiple eye surgeries, left arm graft, hysterectomy, tubal ligation.   ALLERGIES: HYDROCODONE AND METFORMIN.   HOME MEDICATIONS: Crestor, Pro Air, pregabalin, Plavix, Percocet, Levemir, Coreg, aspirin and DuoNeb.   SOCIAL HISTORY: The patient denies any current alcohol, tobacco or illicit drug abuse, but does have a 25 pack-year history of tobacco use and abuse.   FAMILY HISTORY: Father has had colon cancer. No other GI malignancy, colon polyps or IBD in the family that she is aware of.   REVIEW OF SYSTEMS: A 10 system review was attempted to be obtained from the patient. Whatever pertinent positives I was able to gather are mentioned above in the HPI, but unfortunately the patient does appear to be feeling quite sick and is difficult to get a full thorough history from.   PHYSICAL EXAMINATION: VITAL SIGNS: Blood pressure 198/79, heart rate 73, respirations 20, temperature 98.1, bedside pulse ox is 93%.  GENERAL: This is a pleasant 59 year old female resting quietly in bed and does appear to be in mild degree of distress secondary to her not feeling well.  HEAD: Atraumatic, normocephalic.  NECK: Supple. No lymphadenopathy noted.  HENT: Sclerae anicteric. Mucous membranes moist.  LUNGS: Respirations are even and unlabored. Clear to auscultation bilateral anterior lung fields.  HEART: Regular rate and rhythm. S1, S2 noted.  ABDOMEN: Soft, nontender, nondistended. Normoactive bowel sounds noted in all 4 quadrants. No guarding or rebound. No masses, hernias or organomegaly appreciated.  PSYCHIATRIC: The patient does appear to be feeling sick and uncomfortable. Difficult to accurately assess her mood and  affect.  NEUROLOGIC: Cranial nerves II through XII appear grossly intact.  RECTAL: Deferred.   DIAGNOSTIC DATA: White blood cells 9.8, hemoglobin 12.2,  hematocrit 37, platelets 181,000, MCV 107. Sodium 138, potassium 4.2, BUN 27, creatinine 6.78, glucose 79. INR 1. PT 13. Cardiac enzymes were negative. Bilirubin 0.5, alk phos 79, ALT 19, AST 26.   Imaging: EKG shows left ventricular hypertrophy with left atrial enlargement. No acute changes.   Chest x-ray was obtained on the patient showing no signs of acute cardiopulmonary disease.   ASSESSMENT: 1.  Noncardiac chest pain.  2.  Malignant hypertension.  3.  Persistent nausea and vomiting since dialysis yesterday.  4.  End-stage renal disease, on hemodialysis.  5.  Recent non-ST-segment elevation myocardial infarction in December of 2014 requiring PCI.  6.  Diabetes mellitus, currently with blood sugar of 79.   PLAN: I have discussed this patient's case in detail with Dr. Arther Dames who is involved in the development of the patient's plan of care. We spent some time reviewing the patient's medical records and did review cardiology's work-up including their echocardiogram and they have decided to perform a stress test at a later date once she is able to lie flat. Currently, she is on Zofran 4 mg q. 4 p.r.n., Reglan 5 mg t.i.d., and promethazine 25 mg IV q. 4. We do agree with scheduling her antiemetics around the clock to try and settle her stomach. We recommend continuing her n.p.o. status for the time being and try to control her nausea and vomiting conservatively for the time being. Proceeding with an EGD at the present moment might be relatively low yield, but we will watch and see how she responds to scheduling her antiemetics and starting her on bowel rest and certainly if she continues to be symptomatic we can consider a more invasive evaluation if clinically warranted. This was explained to the patient who verbalized understanding and all questions were answered.   The above was discussed and agreed upon under supervisory agreement between myself and Dr. Rayann Heman.  This services provided by  Corky Sox. Tkai Serfass, PA-C under collaborative agreement with Dr. Arther Dames.    ____________________________ Corky Sox. Wilbur Oakland, PA-C kme:sb D: 10/11/2013 16:02:22 ET T: 10/11/2013 16:29:57 ET JOB#: [id number]  cc: Corky Sox. Paige Monarrez, PA-C, <Dictator> [place] PA ELECTRONICALLY SIGNED 10/18/2013 10:00

## 2014-09-30 NOTE — Op Note (Signed)
PATIENT NAME:  Theresa [name], HUGHLEY MR#:  [id number] DATE OF BIRTH:  21-Jan-1956  DATE OF PROCEDURE:  08/03/2013  PREOPERATIVE DIAGNOSES:  1.  End-stage renal disease.  2.  Poorly functioning left arm arteriovenous graft.  3.  Hypertension.  4.  Diabetes.   POSTOPERATIVE DIAGNOSES: 1.  End-stage renal disease.  2.  Poorly functioning left arm arteriovenous graft.  3.  Hypertension.  4.  Diabetes.   PROCEDURES:  1.  Ultrasound guidance for vascular access to left arm arteriovenous graft.  2.  Left upper extremity shuntogram and central venogram.  3.  Percutaneous transluminal angioplasty of venous anastomosis with 7 and 8 mm diameter angioplasty balloon.   SURGEON: Leotis Pain, M.D.   ANESTHESIA: Local with moderate conscious sedation.   ESTIMATED BLOOD LOSS: Minimal.  FLUOROSCOPY TIME: Approximately 2 minutes.   INDICATION FOR PROCEDURE: A 59 year old African American female with end-stage renal disease. She has had diminishing flow of her AV graft. A noninvasive study showing recurrent stenosis of the venous anastomosis. She is brought back for treatment. Risks and benefits were discussed. Informed consent was obtained.   DESCRIPTION OF PROCEDURE: The patient is brought to the vascular suite. Left upper extremity was sterilely prepped and draped and a sterile surgical field was created. The graft was accessed near its apex under direct ultrasound guidance without difficulty with a micropuncture needle and micropuncture wire and sheath were then placed. We upsized to a 6-French sheath. Heparin intravenous 3000 units were given for systemic anticoagulation and imaging was then performed. This showed a significantly hyperplastic stenosis of the basilic vein near in the area just beyond the venous anastomosis over a few centimeters. The remainder of the outflow was patent and the central venous circulation was patent. I crossed the lesion without difficulty with a Magic torque wire. I treated the  lesion initially with a 7 mm diameter angioplasty balloon. With the balloon inflated, imaging was performed to opacify the arterial anastomosis, which was widely patent. I then deflated the balloon. Angiogram still showed some narrowing within the basilic vein about 3 cm beyond the anastomosis. I elected to treat this area with an 8 mm diameter high-pressure angioplasty balloon and the angiographic result following this intervention showed the area to be widely patent without any significant residual stenosis. At this point, I elected to terminate the procedure. The sheaths were removed. A 4-0 Monocryl pursestring suture was placed. Pressure was held. Sterile dressing was placed. The patient tolerated the procedure well and was taken to the recovery room in stable condition.  ____________________________ Algernon Huxley, MD jsd:aw D: 08/03/2013 [id number] ET T: 08/03/2013 11:54:03 ET JOB#: [id number]  cc: Algernon Huxley, MD, <Dictator> Eduard Clos. Gilford Rile, MD [place], MD DR.[facility] DR. Leward Quan MD ELECTRONICALLY SIGNED 08/04/2013 12:02

## 2014-09-30 NOTE — Discharge Summary (Signed)
PATIENT NAME:  Theresa [name], Theresa [name] MR#:  [id number] DATE OF BIRTH:  1956/02/24  DATE OF ADMISSION:  10/10/2013 DATE OF DISCHARGE:  10/12/2013  PRESENTING COMPLAINT: Chest pain, nausea and vomiting.  DISCHARGE DIAGNOSES:  1.  Accelerated hypertension. 2.  Intractable nausea and vomiting.  3.  End-stage renal disease on hemodialysis. 4.  Hypertension. 5.  Chest pain with history of coronary artery disease.  CONDITION ON DISCHARGE: Fair. Vitals stable.  DISCHARGE MEDICATIONS: 1.  Crestor 40 mg at bedtime. 2.  ProAir CHC 1 inhalation daily. 3.  Albuterol DuoNeb q. 6 hours as needed. 4.  Plavix 75 mg daily. 5.  Carvedilol 12.5 mg b.i.d.  6.  Insulin Detemir 15 units at bedtime.  7.  Lyrica 75 mg at bedtime. 8.  Aspirin 81 mg daily. 9.  Percocet 5/325 mg 1 tablet every 6 hours as needed.  10.  Metoclopramide 5 mg t.i.d. with meals 11.  Promethazine 12.5 mg as needed b.i.d. for nausea and vomiting.   DISCHARGE INSTRUCTIONS: Continue hemodialysis as before.   DISCHARGE FOLLOWUP: Follow up with Dr. Ardis Hughs, gastroenterology in [facility] June 9th. Follow up with your primary care physician, Dr. Ronette Deter.   CONSULTANTS:  1.  Cardiology, Dr. Rockey Situ. 2.  Nephrology, Dr. Candiss Norse. 3.  Gastroenterology, Dr. Rayann Heman.  DIAGNOSTIC DATA: Cardiac enzymes x3 negative.   Echo Doppler showed EF of 55% to 60%. Normal left ventricular systolic function. Moderate contractility left ventricular hypertrophy. Mildly dilated left atrium. Moderate mitral valve regurgitation. Mild aortic valve sclerosis without stenosis.   Three sets of cardiac enzymes were negative.   BRIEF SUMMARY OF HOSPITAL COURSE: Theresa [name] is a 59 year old African American female with history of diabetes and end-stage renal disease, on hemodialysis, who comes in with: 1.  Chest pain with history of CAD. She was admitted, continued on her aspirin and Plavix, placed her on Coreg. The patient's cardiac enzymes x3 remained negative. Dr.  Rockey Situ recommends Myoview stress test as outpatient.  2.  Intractable nausea and vomiting, suspected due to GERD, Dr. Rayann Heman saw the patient. Continue to follow up with Dr. Ardis Hughs as outpatient. No further work-up needed. Reglan t.i.d. along with Phenergan p.r.n. was given. The patient tolerated p.o. diet prior to discharge.  3.  Diabetes with neuropathy. Continue Levemir for diabetes and Lyrica for neuropathy.  4.  End-stage renal disease. The patient's inpatient hemodialysis was continued. 5.  Accelerated hypertension. 6.  Intractable nausea and vomiting.   The patient's home meds were resumed and lisinopril was added.  Overall hospital stay otherwise remained stable. The patient remained a FULL code.  TIME SPENT: 45 minutes.  ____________________________ Hart Rochester Posey Pronto, MD sap:sb D: 10/13/2013 07:25:00 ET T: 10/13/2013 11:38:31 ET JOB#: [id number]  cc: Minna Merritts, MD Murlean Iba, MD Arther Dames, MD Eduard Clos Gilford Rile, MD Dr. Ardis Hughs ([facility]) Anija Brickner A. Posey Pronto, MD, <Dictator>     Ilda Basset MD ELECTRONICALLY SIGNED 10/30/2013 16:16

## 2014-10-01 NOTE — Op Note (Signed)
PATIENT NAME:  Theresa [name], Theresa [name] MR#:  [id number] DATE OF BIRTH:  1956-02-24  DATE OF PROCEDURE:  07/23/2011  PREOPERATIVE DIAGNOSIS: Stage IV renal insufficiency.   POSTOPERATIVE DIAGNOSIS: Stage IV renal insufficiency.   PROCEDURES PERFORMED: Exploration of left antecubital fossa.   SURGEON: Katha Cabal, M.D.   ANESTHESIA: MAC.   ESTIMATED BLOOD LOSS: Minimal.   SPECIMEN: None.   INDICATIONS: [name] is a 59 year old woman who presents with worsening renal function and will require dialysis potentially within the next 6 to 12 months. She is therefore undergoing attempts at creating a fistula. Risks and benefits of creating a fistula were reviewed, all questions are answered, vein mapping demonstrated a suitable vein, and she has agreed to proceed.   DESCRIPTION OF PROCEDURE: The patient is taken to the Operating Room and placed in the supine position. After adequate anesthesia is induced and appropriate and invasive monitors are placed, she is positioned with her left arm extended palm upward. The left arm was prepped. A curvilinear incision will be created and this is mapped with a surgical marker and then Sensorcaine is infused into the soft tissues. An incision is then created with a 15 blade scalpel and the dissection is carried down crossing vein and the antecubital is identified quite readily, but appears to be very small. The incision is then extended laterally toward the true cephalic vein. This is encountered and again noted to be 2 mm or less. The crossing vein appeared to be somewhat bigger. This was transected and ligated with a 3-0 Vicryl and then [facility] coronary dilators were utilized. The largest dilator able to passed was a 2 mm. This vein is inadequate for fistula creation and is then ligated with a silk tie and the wound is irrigated and closed in layers     using 3-0 Vicryl followed by 4-0 Monocryl subcuticular. Fistula was not created as the vein was inadequate. The  patient tolerated the procedure without incident and is taken to the Recovery Room in stable condition. ____________________________ Katha Cabal, MD ggs:slb D: 07/23/2011 09:16:34 ET T: 07/23/2011 10:03:59 ET JOB#: [id number]  cc: Katha Cabal, MD, <Dictator> Murlean Iba, MD Katha Cabal MD ELECTRONICALLY SIGNED 08/04/2011 10:33

## 2014-10-02 DIAGNOSIS — D509 Iron deficiency anemia, unspecified: Secondary | ICD-10-CM | POA: Diagnosis not present

## 2014-10-02 DIAGNOSIS — Z992 Dependence on renal dialysis: Secondary | ICD-10-CM | POA: Diagnosis not present

## 2014-10-02 DIAGNOSIS — N186 End stage renal disease: Secondary | ICD-10-CM | POA: Diagnosis not present

## 2014-10-02 DIAGNOSIS — N2581 Secondary hyperparathyroidism of renal origin: Secondary | ICD-10-CM | POA: Diagnosis not present

## 2014-10-02 DIAGNOSIS — D631 Anemia in chronic kidney disease: Secondary | ICD-10-CM | POA: Diagnosis not present

## 2014-10-04 DIAGNOSIS — D631 Anemia in chronic kidney disease: Secondary | ICD-10-CM | POA: Diagnosis not present

## 2014-10-04 DIAGNOSIS — D509 Iron deficiency anemia, unspecified: Secondary | ICD-10-CM | POA: Diagnosis not present

## 2014-10-04 DIAGNOSIS — Z992 Dependence on renal dialysis: Secondary | ICD-10-CM | POA: Diagnosis not present

## 2014-10-04 DIAGNOSIS — N2581 Secondary hyperparathyroidism of renal origin: Secondary | ICD-10-CM | POA: Diagnosis not present

## 2014-10-04 DIAGNOSIS — N186 End stage renal disease: Secondary | ICD-10-CM | POA: Diagnosis not present

## 2014-10-06 DIAGNOSIS — Z992 Dependence on renal dialysis: Secondary | ICD-10-CM | POA: Diagnosis not present

## 2014-10-06 DIAGNOSIS — D631 Anemia in chronic kidney disease: Secondary | ICD-10-CM | POA: Diagnosis not present

## 2014-10-06 DIAGNOSIS — D509 Iron deficiency anemia, unspecified: Secondary | ICD-10-CM | POA: Diagnosis not present

## 2014-10-06 DIAGNOSIS — N2581 Secondary hyperparathyroidism of renal origin: Secondary | ICD-10-CM | POA: Diagnosis not present

## 2014-10-06 DIAGNOSIS — N186 End stage renal disease: Secondary | ICD-10-CM | POA: Diagnosis not present

## 2014-10-08 DIAGNOSIS — Z992 Dependence on renal dialysis: Secondary | ICD-10-CM | POA: Diagnosis not present

## 2014-10-08 DIAGNOSIS — N186 End stage renal disease: Secondary | ICD-10-CM | POA: Diagnosis not present

## 2014-10-09 DIAGNOSIS — Z992 Dependence on renal dialysis: Secondary | ICD-10-CM | POA: Diagnosis not present

## 2014-10-09 DIAGNOSIS — D509 Iron deficiency anemia, unspecified: Secondary | ICD-10-CM | POA: Diagnosis not present

## 2014-10-09 DIAGNOSIS — N2581 Secondary hyperparathyroidism of renal origin: Secondary | ICD-10-CM | POA: Diagnosis not present

## 2014-10-09 DIAGNOSIS — D631 Anemia in chronic kidney disease: Secondary | ICD-10-CM | POA: Diagnosis not present

## 2014-10-09 DIAGNOSIS — N186 End stage renal disease: Secondary | ICD-10-CM | POA: Diagnosis not present

## 2014-10-10 ENCOUNTER — Ambulatory Visit
Admission: RE | Admit: 2014-10-10 | Discharge: 2014-10-10 | Disposition: A | Discharge status: Home / Self Care | Payer: Medicare Other | Source: Ambulatory Visit | Attending: Family Medicine | Admitting: Family Medicine

## 2014-10-10 DIAGNOSIS — M5137 Other intervertebral disc degeneration, lumbosacral region: Secondary | ICD-10-CM | POA: Insufficient documentation

## 2014-10-10 DIAGNOSIS — M5127 Other intervertebral disc displacement, lumbosacral region: Secondary | ICD-10-CM | POA: Diagnosis not present

## 2014-10-11 DIAGNOSIS — Z992 Dependence on renal dialysis: Secondary | ICD-10-CM | POA: Diagnosis not present

## 2014-10-11 DIAGNOSIS — D631 Anemia in chronic kidney disease: Secondary | ICD-10-CM | POA: Diagnosis not present

## 2014-10-11 DIAGNOSIS — N2581 Secondary hyperparathyroidism of renal origin: Secondary | ICD-10-CM | POA: Diagnosis not present

## 2014-10-11 DIAGNOSIS — N186 End stage renal disease: Secondary | ICD-10-CM | POA: Diagnosis not present

## 2014-10-11 DIAGNOSIS — D509 Iron deficiency anemia, unspecified: Secondary | ICD-10-CM | POA: Diagnosis not present

## 2014-10-12 ENCOUNTER — Encounter: Payer: Self-pay | Admitting: *Deleted

## 2014-10-12 ENCOUNTER — Other Ambulatory Visit: Payer: Self-pay | Admitting: Family Medicine

## 2014-10-12 DIAGNOSIS — M5127 Other intervertebral disc displacement, lumbosacral region: Secondary | ICD-10-CM

## 2014-10-12 DIAGNOSIS — M5416 Radiculopathy, lumbar region: Secondary | ICD-10-CM

## 2014-10-12 DIAGNOSIS — M5137 Other intervertebral disc degeneration, lumbosacral region: Secondary | ICD-10-CM

## 2014-10-13 DIAGNOSIS — D631 Anemia in chronic kidney disease: Secondary | ICD-10-CM | POA: Diagnosis not present

## 2014-10-13 DIAGNOSIS — Z992 Dependence on renal dialysis: Secondary | ICD-10-CM | POA: Diagnosis not present

## 2014-10-13 DIAGNOSIS — N2581 Secondary hyperparathyroidism of renal origin: Secondary | ICD-10-CM | POA: Diagnosis not present

## 2014-10-13 DIAGNOSIS — D509 Iron deficiency anemia, unspecified: Secondary | ICD-10-CM | POA: Diagnosis not present

## 2014-10-13 DIAGNOSIS — N186 End stage renal disease: Secondary | ICD-10-CM | POA: Diagnosis not present

## 2014-10-16 DIAGNOSIS — N186 End stage renal disease: Secondary | ICD-10-CM | POA: Diagnosis not present

## 2014-10-16 DIAGNOSIS — N2581 Secondary hyperparathyroidism of renal origin: Secondary | ICD-10-CM | POA: Diagnosis not present

## 2014-10-16 DIAGNOSIS — D509 Iron deficiency anemia, unspecified: Secondary | ICD-10-CM | POA: Diagnosis not present

## 2014-10-16 DIAGNOSIS — D631 Anemia in chronic kidney disease: Secondary | ICD-10-CM | POA: Diagnosis not present

## 2014-10-16 DIAGNOSIS — Z992 Dependence on renal dialysis: Secondary | ICD-10-CM | POA: Diagnosis not present

## 2014-10-18 DIAGNOSIS — D509 Iron deficiency anemia, unspecified: Secondary | ICD-10-CM | POA: Diagnosis not present

## 2014-10-18 DIAGNOSIS — D631 Anemia in chronic kidney disease: Secondary | ICD-10-CM | POA: Diagnosis not present

## 2014-10-18 DIAGNOSIS — N2581 Secondary hyperparathyroidism of renal origin: Secondary | ICD-10-CM | POA: Diagnosis not present

## 2014-10-18 DIAGNOSIS — N186 End stage renal disease: Secondary | ICD-10-CM | POA: Diagnosis not present

## 2014-10-18 DIAGNOSIS — Z992 Dependence on renal dialysis: Secondary | ICD-10-CM | POA: Diagnosis not present

## 2014-10-20 DIAGNOSIS — N186 End stage renal disease: Secondary | ICD-10-CM | POA: Diagnosis not present

## 2014-10-20 DIAGNOSIS — D509 Iron deficiency anemia, unspecified: Secondary | ICD-10-CM | POA: Diagnosis not present

## 2014-10-20 DIAGNOSIS — Z992 Dependence on renal dialysis: Secondary | ICD-10-CM | POA: Diagnosis not present

## 2014-10-20 DIAGNOSIS — N2581 Secondary hyperparathyroidism of renal origin: Secondary | ICD-10-CM | POA: Diagnosis not present

## 2014-10-20 DIAGNOSIS — D631 Anemia in chronic kidney disease: Secondary | ICD-10-CM | POA: Diagnosis not present

## 2014-10-23 DIAGNOSIS — D509 Iron deficiency anemia, unspecified: Secondary | ICD-10-CM | POA: Diagnosis not present

## 2014-10-23 DIAGNOSIS — N2581 Secondary hyperparathyroidism of renal origin: Secondary | ICD-10-CM | POA: Diagnosis not present

## 2014-10-23 DIAGNOSIS — D631 Anemia in chronic kidney disease: Secondary | ICD-10-CM | POA: Diagnosis not present

## 2014-10-23 DIAGNOSIS — N186 End stage renal disease: Secondary | ICD-10-CM | POA: Diagnosis not present

## 2014-10-23 DIAGNOSIS — Z992 Dependence on renal dialysis: Secondary | ICD-10-CM | POA: Diagnosis not present

## 2014-10-25 DIAGNOSIS — Z992 Dependence on renal dialysis: Secondary | ICD-10-CM | POA: Diagnosis not present

## 2014-10-25 DIAGNOSIS — D631 Anemia in chronic kidney disease: Secondary | ICD-10-CM | POA: Diagnosis not present

## 2014-10-25 DIAGNOSIS — D509 Iron deficiency anemia, unspecified: Secondary | ICD-10-CM | POA: Diagnosis not present

## 2014-10-25 DIAGNOSIS — N186 End stage renal disease: Secondary | ICD-10-CM | POA: Diagnosis not present

## 2014-10-25 DIAGNOSIS — N2581 Secondary hyperparathyroidism of renal origin: Secondary | ICD-10-CM | POA: Diagnosis not present

## 2014-10-27 DIAGNOSIS — N2581 Secondary hyperparathyroidism of renal origin: Secondary | ICD-10-CM | POA: Diagnosis not present

## 2014-10-27 DIAGNOSIS — N186 End stage renal disease: Secondary | ICD-10-CM | POA: Diagnosis not present

## 2014-10-27 DIAGNOSIS — D509 Iron deficiency anemia, unspecified: Secondary | ICD-10-CM | POA: Diagnosis not present

## 2014-10-27 DIAGNOSIS — Z992 Dependence on renal dialysis: Secondary | ICD-10-CM | POA: Diagnosis not present

## 2014-10-27 DIAGNOSIS — D631 Anemia in chronic kidney disease: Secondary | ICD-10-CM | POA: Diagnosis not present

## 2014-10-30 DIAGNOSIS — N186 End stage renal disease: Secondary | ICD-10-CM | POA: Diagnosis not present

## 2014-10-30 DIAGNOSIS — Z992 Dependence on renal dialysis: Secondary | ICD-10-CM | POA: Diagnosis not present

## 2014-10-30 DIAGNOSIS — D631 Anemia in chronic kidney disease: Secondary | ICD-10-CM | POA: Diagnosis not present

## 2014-10-30 DIAGNOSIS — D509 Iron deficiency anemia, unspecified: Secondary | ICD-10-CM | POA: Diagnosis not present

## 2014-10-30 DIAGNOSIS — N2581 Secondary hyperparathyroidism of renal origin: Secondary | ICD-10-CM | POA: Diagnosis not present

## 2014-11-01 DIAGNOSIS — Z992 Dependence on renal dialysis: Secondary | ICD-10-CM | POA: Diagnosis not present

## 2014-11-01 DIAGNOSIS — D509 Iron deficiency anemia, unspecified: Secondary | ICD-10-CM | POA: Diagnosis not present

## 2014-11-01 DIAGNOSIS — N2581 Secondary hyperparathyroidism of renal origin: Secondary | ICD-10-CM | POA: Diagnosis not present

## 2014-11-01 DIAGNOSIS — D631 Anemia in chronic kidney disease: Secondary | ICD-10-CM | POA: Diagnosis not present

## 2014-11-01 DIAGNOSIS — N186 End stage renal disease: Secondary | ICD-10-CM | POA: Diagnosis not present

## 2014-11-03 DIAGNOSIS — D631 Anemia in chronic kidney disease: Secondary | ICD-10-CM | POA: Diagnosis not present

## 2014-11-03 DIAGNOSIS — N2581 Secondary hyperparathyroidism of renal origin: Secondary | ICD-10-CM | POA: Diagnosis not present

## 2014-11-03 DIAGNOSIS — N186 End stage renal disease: Secondary | ICD-10-CM | POA: Diagnosis not present

## 2014-11-03 DIAGNOSIS — D509 Iron deficiency anemia, unspecified: Secondary | ICD-10-CM | POA: Diagnosis not present

## 2014-11-03 DIAGNOSIS — Z992 Dependence on renal dialysis: Secondary | ICD-10-CM | POA: Diagnosis not present

## 2014-11-06 DIAGNOSIS — N2581 Secondary hyperparathyroidism of renal origin: Secondary | ICD-10-CM | POA: Diagnosis not present

## 2014-11-06 DIAGNOSIS — D631 Anemia in chronic kidney disease: Secondary | ICD-10-CM | POA: Diagnosis not present

## 2014-11-06 DIAGNOSIS — Z992 Dependence on renal dialysis: Secondary | ICD-10-CM | POA: Diagnosis not present

## 2014-11-06 DIAGNOSIS — D509 Iron deficiency anemia, unspecified: Secondary | ICD-10-CM | POA: Diagnosis not present

## 2014-11-06 DIAGNOSIS — N186 End stage renal disease: Secondary | ICD-10-CM | POA: Diagnosis not present

## 2014-11-08 DIAGNOSIS — N186 End stage renal disease: Secondary | ICD-10-CM | POA: Diagnosis not present

## 2014-11-08 DIAGNOSIS — Z992 Dependence on renal dialysis: Secondary | ICD-10-CM | POA: Diagnosis not present

## 2014-11-08 DIAGNOSIS — N2581 Secondary hyperparathyroidism of renal origin: Secondary | ICD-10-CM | POA: Diagnosis not present

## 2014-11-08 DIAGNOSIS — D509 Iron deficiency anemia, unspecified: Secondary | ICD-10-CM | POA: Diagnosis not present

## 2014-11-08 DIAGNOSIS — D631 Anemia in chronic kidney disease: Secondary | ICD-10-CM | POA: Diagnosis not present

## 2014-11-10 DIAGNOSIS — D631 Anemia in chronic kidney disease: Secondary | ICD-10-CM | POA: Diagnosis not present

## 2014-11-10 DIAGNOSIS — Z992 Dependence on renal dialysis: Secondary | ICD-10-CM | POA: Diagnosis not present

## 2014-11-10 DIAGNOSIS — N186 End stage renal disease: Secondary | ICD-10-CM | POA: Diagnosis not present

## 2014-11-10 DIAGNOSIS — N2581 Secondary hyperparathyroidism of renal origin: Secondary | ICD-10-CM | POA: Diagnosis not present

## 2014-11-10 DIAGNOSIS — D509 Iron deficiency anemia, unspecified: Secondary | ICD-10-CM | POA: Diagnosis not present

## 2014-11-13 DIAGNOSIS — Z992 Dependence on renal dialysis: Secondary | ICD-10-CM | POA: Diagnosis not present

## 2014-11-13 DIAGNOSIS — N186 End stage renal disease: Secondary | ICD-10-CM | POA: Diagnosis not present

## 2014-11-13 DIAGNOSIS — D509 Iron deficiency anemia, unspecified: Secondary | ICD-10-CM | POA: Diagnosis not present

## 2014-11-13 DIAGNOSIS — D631 Anemia in chronic kidney disease: Secondary | ICD-10-CM | POA: Diagnosis not present

## 2014-11-13 DIAGNOSIS — N2581 Secondary hyperparathyroidism of renal origin: Secondary | ICD-10-CM | POA: Diagnosis not present

## 2014-11-15 DIAGNOSIS — N2581 Secondary hyperparathyroidism of renal origin: Secondary | ICD-10-CM | POA: Diagnosis not present

## 2014-11-15 DIAGNOSIS — D509 Iron deficiency anemia, unspecified: Secondary | ICD-10-CM | POA: Diagnosis not present

## 2014-11-15 DIAGNOSIS — D631 Anemia in chronic kidney disease: Secondary | ICD-10-CM | POA: Diagnosis not present

## 2014-11-15 DIAGNOSIS — N186 End stage renal disease: Secondary | ICD-10-CM | POA: Diagnosis not present

## 2014-11-15 DIAGNOSIS — Z992 Dependence on renal dialysis: Secondary | ICD-10-CM | POA: Diagnosis not present

## 2014-11-17 DIAGNOSIS — N186 End stage renal disease: Secondary | ICD-10-CM | POA: Diagnosis not present

## 2014-11-17 DIAGNOSIS — D509 Iron deficiency anemia, unspecified: Secondary | ICD-10-CM | POA: Diagnosis not present

## 2014-11-17 DIAGNOSIS — N2581 Secondary hyperparathyroidism of renal origin: Secondary | ICD-10-CM | POA: Diagnosis not present

## 2014-11-17 DIAGNOSIS — D631 Anemia in chronic kidney disease: Secondary | ICD-10-CM | POA: Diagnosis not present

## 2014-11-17 DIAGNOSIS — Z992 Dependence on renal dialysis: Secondary | ICD-10-CM | POA: Diagnosis not present

## 2014-11-20 DIAGNOSIS — Z992 Dependence on renal dialysis: Secondary | ICD-10-CM | POA: Diagnosis not present

## 2014-11-20 DIAGNOSIS — N186 End stage renal disease: Secondary | ICD-10-CM | POA: Diagnosis not present

## 2014-11-20 DIAGNOSIS — M5416 Radiculopathy, lumbar region: Secondary | ICD-10-CM | POA: Diagnosis not present

## 2014-11-20 DIAGNOSIS — D509 Iron deficiency anemia, unspecified: Secondary | ICD-10-CM | POA: Diagnosis not present

## 2014-11-20 DIAGNOSIS — N2581 Secondary hyperparathyroidism of renal origin: Secondary | ICD-10-CM | POA: Diagnosis not present

## 2014-11-20 DIAGNOSIS — M5136 Other intervertebral disc degeneration, lumbar region: Secondary | ICD-10-CM | POA: Diagnosis not present

## 2014-11-20 DIAGNOSIS — D631 Anemia in chronic kidney disease: Secondary | ICD-10-CM | POA: Diagnosis not present

## 2014-11-22 DIAGNOSIS — N186 End stage renal disease: Secondary | ICD-10-CM | POA: Diagnosis not present

## 2014-11-22 DIAGNOSIS — Z992 Dependence on renal dialysis: Secondary | ICD-10-CM | POA: Diagnosis not present

## 2014-11-22 DIAGNOSIS — D631 Anemia in chronic kidney disease: Secondary | ICD-10-CM | POA: Diagnosis not present

## 2014-11-22 DIAGNOSIS — D509 Iron deficiency anemia, unspecified: Secondary | ICD-10-CM | POA: Diagnosis not present

## 2014-11-22 DIAGNOSIS — N2581 Secondary hyperparathyroidism of renal origin: Secondary | ICD-10-CM | POA: Diagnosis not present

## 2014-11-23 ENCOUNTER — Encounter: Payer: Self-pay | Admitting: Emergency Medicine

## 2014-11-23 ENCOUNTER — Inpatient Hospital Stay
Admission: EM | Admit: 2014-11-23 | Discharge: 2014-11-25 | DRG: 069 | Disposition: A | Discharge status: Home / Self Care | Payer: Medicare Other | Attending: Internal Medicine | Admitting: Internal Medicine

## 2014-11-23 ENCOUNTER — Emergency Department: Payer: Medicare Other

## 2014-11-23 DIAGNOSIS — E1121 Type 2 diabetes mellitus with diabetic nephropathy: Secondary | ICD-10-CM | POA: Diagnosis present

## 2014-11-23 DIAGNOSIS — E1140 Type 2 diabetes mellitus with diabetic neuropathy, unspecified: Secondary | ICD-10-CM | POA: Diagnosis present

## 2014-11-23 DIAGNOSIS — I340 Nonrheumatic mitral (valve) insufficiency: Secondary | ICD-10-CM | POA: Diagnosis present

## 2014-11-23 DIAGNOSIS — Z992 Dependence on renal dialysis: Secondary | ICD-10-CM | POA: Diagnosis not present

## 2014-11-23 DIAGNOSIS — E119 Type 2 diabetes mellitus without complications: Secondary | ICD-10-CM | POA: Diagnosis not present

## 2014-11-23 DIAGNOSIS — I5032 Chronic diastolic (congestive) heart failure: Secondary | ICD-10-CM | POA: Diagnosis not present

## 2014-11-23 DIAGNOSIS — Z79891 Long term (current) use of opiate analgesic: Secondary | ICD-10-CM | POA: Diagnosis not present

## 2014-11-23 DIAGNOSIS — Z86010 Personal history of colonic polyps: Secondary | ICD-10-CM

## 2014-11-23 DIAGNOSIS — E785 Hyperlipidemia, unspecified: Secondary | ICD-10-CM | POA: Diagnosis present

## 2014-11-23 DIAGNOSIS — Z955 Presence of coronary angioplasty implant and graft: Secondary | ICD-10-CM | POA: Diagnosis not present

## 2014-11-23 DIAGNOSIS — N2581 Secondary hyperparathyroidism of renal origin: Secondary | ICD-10-CM | POA: Diagnosis not present

## 2014-11-23 DIAGNOSIS — D638 Anemia in other chronic diseases classified elsewhere: Secondary | ICD-10-CM | POA: Diagnosis present

## 2014-11-23 DIAGNOSIS — Z794 Long term (current) use of insulin: Secondary | ICD-10-CM

## 2014-11-23 DIAGNOSIS — Z800 Family history of malignant neoplasm of digestive organs: Secondary | ICD-10-CM | POA: Diagnosis not present

## 2014-11-23 DIAGNOSIS — G459 Transient cerebral ischemic attack, unspecified: Secondary | ICD-10-CM | POA: Diagnosis present

## 2014-11-23 DIAGNOSIS — I120 Hypertensive chronic kidney disease with stage 5 chronic kidney disease or end stage renal disease: Secondary | ICD-10-CM | POA: Diagnosis present

## 2014-11-23 DIAGNOSIS — Z885 Allergy status to narcotic agent status: Secondary | ICD-10-CM

## 2014-11-23 DIAGNOSIS — R079 Chest pain, unspecified: Secondary | ICD-10-CM | POA: Diagnosis not present

## 2014-11-23 DIAGNOSIS — K5900 Constipation, unspecified: Secondary | ICD-10-CM | POA: Diagnosis present

## 2014-11-23 DIAGNOSIS — I10 Essential (primary) hypertension: Secondary | ICD-10-CM | POA: Diagnosis present

## 2014-11-23 DIAGNOSIS — Z7982 Long term (current) use of aspirin: Secondary | ICD-10-CM

## 2014-11-23 DIAGNOSIS — I639 Cerebral infarction, unspecified: Secondary | ICD-10-CM | POA: Diagnosis not present

## 2014-11-23 DIAGNOSIS — Z823 Family history of stroke: Secondary | ICD-10-CM | POA: Diagnosis not present

## 2014-11-23 DIAGNOSIS — I739 Peripheral vascular disease, unspecified: Secondary | ICD-10-CM | POA: Diagnosis present

## 2014-11-23 DIAGNOSIS — Z87891 Personal history of nicotine dependence: Secondary | ICD-10-CM | POA: Diagnosis not present

## 2014-11-23 DIAGNOSIS — N186 End stage renal disease: Secondary | ICD-10-CM | POA: Diagnosis present

## 2014-11-23 DIAGNOSIS — D573 Sickle-cell trait: Secondary | ICD-10-CM | POA: Diagnosis present

## 2014-11-23 DIAGNOSIS — Z8249 Family history of ischemic heart disease and other diseases of the circulatory system: Secondary | ICD-10-CM | POA: Diagnosis not present

## 2014-11-23 DIAGNOSIS — R531 Weakness: Secondary | ICD-10-CM

## 2014-11-23 DIAGNOSIS — Z8701 Personal history of pneumonia (recurrent): Secondary | ICD-10-CM

## 2014-11-23 DIAGNOSIS — R0789 Other chest pain: Secondary | ICD-10-CM

## 2014-11-23 DIAGNOSIS — D631 Anemia in chronic kidney disease: Secondary | ICD-10-CM | POA: Diagnosis not present

## 2014-11-23 DIAGNOSIS — J449 Chronic obstructive pulmonary disease, unspecified: Secondary | ICD-10-CM | POA: Diagnosis present

## 2014-11-23 DIAGNOSIS — Z803 Family history of malignant neoplasm of breast: Secondary | ICD-10-CM | POA: Diagnosis not present

## 2014-11-23 DIAGNOSIS — I252 Old myocardial infarction: Secondary | ICD-10-CM | POA: Diagnosis not present

## 2014-11-23 DIAGNOSIS — E11359 Type 2 diabetes mellitus with proliferative diabetic retinopathy without macular edema: Secondary | ICD-10-CM | POA: Diagnosis present

## 2014-11-23 DIAGNOSIS — Z888 Allergy status to other drugs, medicaments and biological substances status: Secondary | ICD-10-CM

## 2014-11-23 DIAGNOSIS — Z833 Family history of diabetes mellitus: Secondary | ICD-10-CM | POA: Diagnosis not present

## 2014-11-23 DIAGNOSIS — I2510 Atherosclerotic heart disease of native coronary artery without angina pectoris: Secondary | ICD-10-CM | POA: Diagnosis present

## 2014-11-23 DIAGNOSIS — R200 Anesthesia of skin: Secondary | ICD-10-CM | POA: Diagnosis not present

## 2014-11-23 DIAGNOSIS — I6523 Occlusion and stenosis of bilateral carotid arteries: Secondary | ICD-10-CM | POA: Diagnosis not present

## 2014-11-23 DIAGNOSIS — R209 Unspecified disturbances of skin sensation: Secondary | ICD-10-CM | POA: Diagnosis not present

## 2014-11-23 LAB — BASIC METABOLIC PANEL
Anion gap: 11 (ref 5–15)
BUN: 36 mg/dL — AB (ref 6–20)
CALCIUM: 9 mg/dL (ref 8.9–10.3)
CHLORIDE: 94 mmol/L — AB (ref 101–111)
CO2: 33 mmol/L — AB (ref 22–32)
Creatinine, Ser: 7.9 mg/dL — ABNORMAL HIGH (ref 0.44–1.00)
GFR calc Af Amer: 6 mL/min — ABNORMAL LOW (ref >60)
GFR calc non Af Amer: 5 mL/min — ABNORMAL LOW (ref >60)
GLUCOSE: 141 mg/dL — AB (ref 65–99)
Potassium: 4.6 mmol/L (ref 3.5–5.1)
Sodium: 138 mmol/L (ref 135–145)

## 2014-11-23 LAB — CBC
HCT: 32.7 % — ABNORMAL LOW (ref 35.0–47.0)
HEMOGLOBIN: 11.1 g/dL — AB (ref 12.0–16.0)
MCH: 35.1 pg — ABNORMAL HIGH (ref 26.0–34.0)
MCHC: 33.9 g/dL (ref 32.0–36.0)
MCV: 103.5 fL — AB (ref 80.0–100.0)
Platelets: 152 10*3/uL (ref 150–440)
RBC: 3.16 MIL/uL — AB (ref 3.80–5.20)
RDW: 15 % — ABNORMAL HIGH (ref 11.5–14.5)
WBC: 9.2 10*3/uL (ref 3.6–11.0)

## 2014-11-23 LAB — TROPONIN I: Troponin I: <0.03 ng/mL (ref <0.031)

## 2014-11-23 LAB — GLUCOSE, CAPILLARY: Glucose-Capillary: 149 mg/dL — ABNORMAL HIGH (ref 65–99)

## 2014-11-23 MED ORDER — ASPIRIN 325 MG PO TABS
325.0000 mg | ORAL_TABLET | Freq: Every day | ORAL | Status: DC
  Administered 2014-11-24 – 2014-11-25 (×2): 325 mg via ORAL
  Filled 2014-11-23 (×2): qty 1

## 2014-11-23 MED ORDER — PREGABALIN 75 MG PO CAPS
75.0000 mg | ORAL_CAPSULE | Freq: Every day | ORAL | Status: DC
  Administered 2014-11-24 – 2014-11-25 (×3): 75 mg via ORAL
  Filled 2014-11-23 (×3): qty 1

## 2014-11-23 MED ORDER — ROSUVASTATIN CALCIUM 20 MG PO TABS
40.0000 mg | ORAL_TABLET | Freq: Every day | ORAL | Status: DC
  Administered 2014-11-24 – 2014-11-25 (×2): 40 mg via ORAL
  Filled 2014-11-23 (×2): qty 2

## 2014-11-23 MED ORDER — SODIUM CHLORIDE 0.9 % IV SOLN: INTRAVENOUS | Status: DC

## 2014-11-23 MED ORDER — STROKE: EARLY STAGES OF RECOVERY BOOK
Freq: Once | Status: AC
  Administered 2014-11-24

## 2014-11-23 MED ORDER — FLUTICASONE PROPIONATE 50 MCG/ACT NA SUSP
1.0000 | Freq: Every day | NASAL | Status: DC
  Administered 2014-11-25: 1 via NASAL
  Filled 2014-11-23 (×2): qty 16

## 2014-11-23 MED ORDER — ACETAMINOPHEN 650 MG RE SUPP: 650.0000 mg | RECTAL | Status: DC | PRN

## 2014-11-23 MED ORDER — INSULIN ASPART 100 UNIT/ML ~~LOC~~ SOLN
0.0000 [IU] | Freq: Three times a day (TID) | SUBCUTANEOUS | Status: DC
  Administered 2014-11-24: 3 [IU] via SUBCUTANEOUS
  Filled 2014-11-23: qty 3

## 2014-11-23 MED ORDER — CALCIUM ACETATE 667 MG PO CAPS
667.0000 mg | ORAL_CAPSULE | Freq: Every day | ORAL | Status: DC
Start: 2014-11-23 — End: 2014-11-25
  Administered 2014-11-23: 667 mg via ORAL
  Administered 2014-11-24: 2668 mg via ORAL
  Administered 2014-11-24: 667 mg via ORAL
  Administered 2014-11-24: 1334 mg via ORAL
  Administered 2014-11-25 (×2): 667 mg via ORAL
  Filled 2014-11-23: qty 4
  Filled 2014-11-23: qty 1
  Filled 2014-11-23 (×4): qty 4
  Filled 2014-11-23 (×2): qty 1
  Filled 2014-11-23 (×2): qty 4
  Filled 2014-11-23 (×2): qty 1
  Filled 2014-11-23: qty 4
  Filled 2014-11-23: qty 3
  Filled 2014-11-23 (×2): qty 4
  Filled 2014-11-23: qty 1
  Filled 2014-11-23 (×3): qty 4

## 2014-11-23 MED ORDER — OXYCODONE-ACETAMINOPHEN 5-325 MG PO TABS
1.0000 | ORAL_TABLET | Freq: Three times a day (TID) | ORAL | Status: DC | PRN
  Administered 2014-11-23 – 2014-11-25 (×4): 1 via ORAL
  Filled 2014-11-23 (×5): qty 1

## 2014-11-23 MED ORDER — LABETALOL HCL 5 MG/ML IV SOLN
10.0000 mg | Freq: Once | INTRAVENOUS | Status: AC
  Administered 2014-11-23: 10 mg via INTRAVENOUS

## 2014-11-23 MED ORDER — ALBUTEROL SULFATE (2.5 MG/3ML) 0.083% IN NEBU: 3.0000 mL | INHALATION_SOLUTION | Freq: Four times a day (QID) | RESPIRATORY_TRACT | Status: DC | PRN

## 2014-11-23 MED ORDER — HEPARIN SODIUM (PORCINE) 5000 UNIT/ML IJ SOLN
5000.0000 [IU] | Freq: Three times a day (TID) | INTRAMUSCULAR | Status: DC
  Administered 2014-11-23 – 2014-11-25 (×4): 5000 [IU] via SUBCUTANEOUS
  Filled 2014-11-23 (×4): qty 1

## 2014-11-23 MED ORDER — CLONIDINE HCL 0.1 MG PO TABS
0.1000 mg | ORAL_TABLET | Freq: Every day | ORAL | Status: DC
  Administered 2014-11-24 – 2014-11-25 (×2): 0.1 mg via ORAL
  Filled 2014-11-23 (×2): qty 1

## 2014-11-23 MED ORDER — SENNOSIDES-DOCUSATE SODIUM 8.6-50 MG PO TABS: 1.0000 | ORAL_TABLET | Freq: Every evening | ORAL | Status: DC | PRN

## 2014-11-23 MED ORDER — LABETALOL HCL 5 MG/ML IV SOLN
INTRAVENOUS | Status: AC
  Filled 2014-11-23: qty 4

## 2014-11-23 MED ORDER — HYDRALAZINE HCL 20 MG/ML IJ SOLN
INTRAMUSCULAR | Status: AC
  Administered 2014-11-23: 10 mg via INTRAVENOUS
  Filled 2014-11-23: qty 1

## 2014-11-23 MED ORDER — CLONIDINE HCL 0.1 MG PO TABS
0.1000 mg | ORAL_TABLET | Freq: Once | ORAL | Status: AC
  Administered 2014-11-23: 0.1 mg via ORAL

## 2014-11-23 MED ORDER — CLONIDINE HCL 0.1 MG PO TABS
ORAL_TABLET | ORAL | Status: AC
  Administered 2014-11-23: 0.1 mg via ORAL
  Filled 2014-11-23: qty 1

## 2014-11-23 MED ORDER — CARVEDILOL 12.5 MG PO TABS
12.5000 mg | ORAL_TABLET | Freq: Two times a day (BID) | ORAL | Status: DC
  Administered 2014-11-23 – 2014-11-25 (×4): 12.5 mg via ORAL
  Filled 2014-11-23 (×4): qty 1

## 2014-11-23 MED ORDER — LABETALOL HCL 5 MG/ML IV SOLN
INTRAVENOUS | Status: AC
  Administered 2014-11-23: 10 mg via INTRAVENOUS
  Filled 2014-11-23: qty 4

## 2014-11-23 MED ORDER — HYDRALAZINE HCL 20 MG/ML IJ SOLN
10.0000 mg | Freq: Four times a day (QID) | INTRAMUSCULAR | Status: DC | PRN
  Administered 2014-11-23 – 2014-11-25 (×2): 10 mg via INTRAVENOUS
  Filled 2014-11-23: qty 1

## 2014-11-23 MED ORDER — ASPIRIN 300 MG RE SUPP
300.0000 mg | Freq: Every day | RECTAL | Status: DC
  Filled 2014-11-23: qty 1

## 2014-11-23 MED ORDER — NITROGLYCERIN 0.4 MG SL SUBL
0.4000 mg | SUBLINGUAL_TABLET | SUBLINGUAL | Status: DC | PRN
  Administered 2014-11-24 (×3): 0.4 mg via SUBLINGUAL
  Filled 2014-11-23: qty 2
  Filled 2014-11-23: qty 1

## 2014-11-23 MED ORDER — INSULIN DETEMIR 100 UNIT/ML ~~LOC~~ SOLN
5.0000 [IU] | Freq: Every day | SUBCUTANEOUS | Status: DC
  Administered 2014-11-25: 5 [IU] via SUBCUTANEOUS
  Filled 2014-11-23 (×3): qty 0.05

## 2014-11-23 MED ORDER — INSULIN ASPART 100 UNIT/ML ~~LOC~~ SOLN: 0.0000 [IU] | Freq: Every day | SUBCUTANEOUS | Status: DC

## 2014-11-23 MED ORDER — ACETAMINOPHEN 325 MG PO TABS
650.0000 mg | ORAL_TABLET | ORAL | Status: DC | PRN
  Administered 2014-11-24: 650 mg via ORAL

## 2014-11-23 MED ORDER — RENA-VITE PO TABS
1.0000 | ORAL_TABLET | Freq: Every day | ORAL | Status: DC
  Administered 2014-11-24: 1 via ORAL
  Filled 2014-11-23 (×3): qty 1

## 2014-11-23 NOTE — ED Notes (Signed)
AAOx3.  Skin warm and dry. No SOB/DOE.  Chest pain 7/10.

## 2014-11-23 NOTE — ED Notes (Signed)
Pt to rm 13 from home via EMS.  EMS reports left sided facial numbness since Sunday, left sided CP since 3pm.  Pt report radiate to left arm and neck.  Pt took 2 nitro at home, 4 baby ASA with EMS, and 1" nitro paste with EMS.

## 2014-11-23 NOTE — ED Provider Notes (Signed)
[facility] Emergency Department Provider Note  ____________________________________________  Time seen: 1950  I have reviewed the triage vital signs and the nursing notes.   HISTORY  Chief Complaint Chest Pain and Numbness     HPI Theresa [name] is a 59 y.o. female with end-stage renal disease and history of a prior heart attack in 2014 presents with chest pain in her left chest. The chest pain is moderate. She denies shortness of breath or nausea. She has no diaphoresis. In addition to the chest pain she reports she has a funny feeling going down her left arm to her left fingers and some left facial numbness. She denies having a headache.  She does report her blood pressure is higher than usual. She had dialysis as usual yesterday. She's had no other acute changes.  Patient had called 911. With the chest pain she was instructed by dispatch to chew 4 - 81 mg aspirins, which she did.     Past Medical History  Diagnosis Date  . Hypertension   . ESRD on hemodialysis     a. [facility] in [place], [place], on a TTS schedule.  She started dialysis in Feb 2014.  Etiology of renal failure not known, likely diabetes.  Has a left upper arm AV graft.  Marland Kitchen COPD (chronic obstructive pulmonary disease)   . Emphysema   . History of pneumonia     June 2012  . History of bronchitis     Mar 2012  . Peripheral vascular disease   . Chronic constipation   . Colon polyps   . Sickle cell trait   . Anemia of chronic disease   . Diabetes mellitus   . Moderate mitral insufficiency     a. 10/2013 Echo: EF 55-60%, mod MR.  . Diabetic neuropathy   . Diabetic retinopathy 05/28/2013    Hx bilat retinal detachment, proliferative diab retinopathy and bilat vitreous hemorrhage   . Hyperlipidemia   . Coronary artery disease     a. 05/2013 NSTEMI/PCI: LM 20d, LAD min irregs, LCX small, nl, OM1 nl, RCA dom 53m(2.5x16 Promus DES), PDA1 80p.  . Chronic diastolic CHF (congestive heart  failure)     a. 10/2013 Echo ([facility]: EF 55-60%, mod conc LVH, mod MR, mildly dil LA, mild Ao sclerosis w/o stenosis.  . Carotid arterial disease     a. 02/2013 U/S: 40-59% bilat ICA stenosis - *f/u 02/2014*  . History of tobacco abuse     a. Quit 2012.    Patient Active Problem List   Diagnosis Date Noted  . Acute CVA (cerebrovascular accident) 11/23/2014  . Right hip pain 06/13/2014  . Other malaise and fatigue 02/21/2014  . Anemia, unspecified 02/02/2014  . Ingrown thumb nail, right 02/02/2014  . Tachycardia 01/26/2014  . Nausea with vomiting 10/25/2013  . Routine general medical examination at a health care facility 09/02/2013  . Osteoarthritis of right hip 08/23/2013  . DDD (degenerative disc disease), lumbosacral 08/23/2013  . Coronary artery disease 06/27/2013  . NSTEMI (non-ST elevated myocardial infarction) 05/30/2013  . Diabetic retinopathy 05/28/2013  . Hyperlipidemia   . End stage kidney disease 11/08/2012  . HLD (hyperlipidemia) 11/08/2012  . Essential (primary) hypertension 11/08/2012  . Diabetic neuropathy 02/18/2012  . Anemia in chronic kidney disease (CKD) 02/18/2012  . ESRD on hemodialysis 08/21/2011  . Diabetes mellitus, type II 06/30/2011  . COPD (chronic obstructive pulmonary disease) 06/13/2011    Past Surgical History  Procedure Laterality Date  . Abdominal hysterectomy  2000  . Dilation and curettage of uterus      several in the early 80's  . Eye surgery      bilateral laser 2012  . Esophagogastroduodenoscopy      2012  . Colonscopy    . Tubal ligation      1979  . Pars plana vitrectomy  04/22/2011    Procedure: PARS PLANA VITRECTOMY WITH 25 GAUGE;  Surgeon: [name], MD;  Location: [place];  Service: Ophthalmology;  Laterality: Left;  membrane peel, endolaser, gas fluid exchange, silicone oil, repair of complex traction retinal detachment  . Pars plana vitrectomy  09/30/2011    Procedure: PARS PLANA VITRECTOMY WITH 25 GAUGE;  Surgeon:  [name], MD;  Location: [place];  Service: Ophthalmology;  Laterality: Right;  Endolaser; Repair of Complex Traction Retinal Detachment  . Gas/fluid exchange  09/30/2011    Procedure: GAS/FLUID EXCHANGE;  Surgeon: [name], MD;  Location: [place];  Service: Ophthalmology;  Laterality: Right;  . Gas insertion  09/30/2011    Procedure: INSERTION OF GAS;  Surgeon: [name], MD;  Location: [place];  Service: Ophthalmology;  Laterality: Right;  [id number]  . Eye surgery      right  . Pars plana vitrectomy  02/24/2012    Procedure: PARS PLANA VITRECTOMY WITH 25 GAUGE;  Surgeon: [name], MD;  Location: [place];  Service: Ophthalmology;  Laterality: Left;  . Silicon oil removal  4/88/8916    Procedure: SILICON OIL REMOVAL;  Surgeon: [name], MD;  Location: [place];  Service: Ophthalmology;  Laterality: Left;  . Ptca    . Thrombectomy / arteriovenous graft revision    . Cardiac catheterization    . Coronary angioplasty  05/28/2014    stent placement to the mid RCA  . Left heart catheterization with coronary angiogram N/A 05/28/2013    Procedure: LEFT HEART CATHETERIZATION WITH CORONARY ANGIOGRAM;  Surgeon: [name], MD;  Location: [facility] CATH LAB;  Service: Cardiovascular;  Laterality: N/A;    Current Outpatient Rx  Name  Route  Sig  Dispense  Refill  . albuterol (PROVENTIL HFA;VENTOLIN HFA) 108 (90 BASE) MCG/ACT inhaler   Inhalation   Inhale 2 puffs into the lungs every 6 (six) hours as needed for wheezing or shortness of breath.         . Alcohol Swabs PADS      Patient uses at home to assist with testing blood sugars twice daily. Patient taking differently: 1 each by Other route 2 (two) times daily. Pt uses when testing blood sugars.   100 each   12   . aspirin EC 81 MG tablet   Oral   Take 81 mg by mouth daily.         Marland Kitchen b complex-vitamin c-folic acid (NEPHRO-VITE) 0.8 MG TABS tablet   Oral   Take 1 tablet by mouth daily.         Marland Kitchen BAYER CONTOUR  TEST test strip      TEST BLOOD SUGARS 2 TIMES DAILY   100 each   PRN   . Blood Glucose Monitoring Suppl (ONE TOUCH ULTRA SYSTEM KIT) W/DEVICE KIT      Please dispense for patient to use at home to check blood sugars. Patient taking differently: 1 kit by Other route See admin instructions. Pt uses to test blood sugars.   1 each   0   . calcium acetate (PHOSLO) 667 MG capsule   Oral  Take [phone] mg by mouth 5 (five) times daily. Pt takes four capsules with meals and two capsules with snacks.         . carvedilol (COREG) 12.5 MG tablet   Oral   Take 1 tablet (12.5 mg total) by mouth 2 (two) times daily.   60 tablet   6   . cloNIDine (CATAPRES) 0.1 MG tablet   Oral   Take 0.1 mg by mouth daily.         . Insulin Detemir (LEVEMIR) 100 UNIT/ML Pen   Subcutaneous   Inject 5 Units into the skin daily.   15 mL   3   . Lancets Thin MISC      Using at home to test blood sugars twice daily. Patient taking differently: 1 each by Other route 2 (two) times daily. Pt uses to test blood sugars.   200 each   6   . mometasone (NASONEX) 50 MCG/ACT nasal spray   Nasal   Place 2 sprays into the nose 2 (two) times daily as needed (for allergies).         . nitroGLYCERIN (NITROSTAT) 0.4 MG SL tablet   Sublingual   Place 1 tablet (0.4 mg total) under the tongue every 5 (five) minutes as needed for chest pain.   25 tablet   3   . NOVOFINE 32G X 6 MM MISC      USE WITH INUSLIN AS NEEDED Patient taking differently: USE WITH INSULIN AS NEEDED   100 each   0   . oxyCODONE-acetaminophen (ROXICET) 5-325 MG per tablet   Oral   Take 1 tablet by mouth every 8 (eight) hours as needed for severe pain.   60 tablet   0   . pregabalin (LYRICA) 75 MG capsule   Oral   Take 1 capsule (75 mg total) by mouth daily.   30 capsule   3   . rosuvastatin (CRESTOR) 40 MG tablet   Oral   Take 1 tablet (40 mg total) by mouth daily.   90 tablet   3   . lidocaine (XYLOCAINE) 4 %  external solution   Topical   Apply topically 3 (three) times daily as needed. Patient not taking: Reported on 11/23/2014   50 mL   5     Allergies Metformin and Hydrocodone  Family History  Problem Relation Age of Onset  . Anesthesia problems Neg Hx   . Hypotension Neg Hx   . Malignant hyperthermia Neg Hx   . Pseudochol deficiency Neg Hx   . Stroke Mother   . Heart attack Mother   . Heart disease Mother   . Colon cancer Father   . Colon cancer Sister   . Heart attack Brother   . Stroke Brother   . Diabetes Brother   . Breast cancer Sister   . Diabetes Brother   . Diabetes Daughter     Social History History  Substance Use Topics  . Smoking status: Former Smoker -- 1.00 packs/day for 25 years    Quit date: 06/09/2010  . Smokeless tobacco: Never Used  . Alcohol Use: No    Review of Systems Constitutional: Negative for fever. ENT: Negative for sore throat. Cardiovascular: Positive for chest pain as well as hypertension. See history of present illness Respiratory: Negative for shortness of breath. Gastrointestinal: Negative for abdominal pain, vomiting and diarrhea. Genitourinary: Negative for dysuria. Musculoskeletal: Negative for back pain. Skin: Negative for rash. Neurological: Positive for paresthesia into the left arm  and left face. See history of present illness  10-point ROS otherwise negative.  ____________________________________________   PHYSICAL EXAM:  VITAL SIGNS: ED Triage Vitals  Enc Vitals Group     BP 11/23/14 1918 190/78 mmHg     Pulse Rate 11/23/14 1918 74     Resp 11/23/14 1918 21     Temp 11/23/14 1918 98.5 F (36.9 C)     Temp Source 11/23/14 1918 Oral     SpO2 11/23/14 1918 96 %     Weight 11/23/14 1918 172 lb (78.019 kg)     Height 11/23/14 1918 5' 7" (1.702 m)     Head Cir --      Peak Flow --      Pain Score 11/23/14 1919 7     Pain Loc --      Pain Edu? --      Excl. in [place]? --     Constitutional: Alert and oriented.  Well appearing and in no distress. ENT   Head: Normocephalic and atraumatic.   Nose: No congestion/rhinnorhea.   Mouth/Throat: Mucous membranes are moist. Cardiovascular: Normal rate, regular rhythm. Respiratory: Normal respiratory effort without tachypnea. Breath sounds are clear and equal bilaterally. No wheezes/rales/rhonchi. Gastrointestinal: Soft and nontender. No distention.  Back: No muscle spasm, no tenderness, no CVA tenderness. Musculoskeletal: Nontender with normal range of motion in all extremities.  No noted edema. Neurologic:  Normal speech and language. Left grip is lighter than the right. She has 5 or 5 strength in the right arm and 4+ over 5 strength in the left arm. She has 5 over 5 strength in the right leg with 4+ over 5 strength in the left leg. She has mildly diminished sensation in the left face. She has a mild palsy noted in the left face as well. Skin:  Skin is warm, dry. No rash noted. Psychiatric: Mood and affect are normal. Speech and behavior are normal.  ____________________________________________    LABS (pertinent positives/negatives)  Labs Reviewed  CBC - Abnormal; Notable for the following:    RBC 3.16 (*)    Hemoglobin 11.1 (*)    HCT 32.7 (*)    MCV 103.5 (*)    MCH 35.1 (*)    RDW 15.0 (*)    All other components within normal limits  BASIC METABOLIC PANEL - Abnormal; Notable for the following:    Chloride 94 (*)    CO2 33 (*)    Glucose, Bld 141 (*)    BUN 36 (*)    Creatinine, Ser 7.90 (*)    GFR calc non Af Amer 5 (*)    GFR calc Af Amer 6 (*)    All other components within normal limits  TROPONIN I   troponin is negative   ____________________________________________   EKG  ED ECG REPORT I, Luci Bellucci W, the attending physician, personally viewed and interpreted this ECG.   Date: 11/23/2014  EKG Time: 1918  Rate: 74  Rhythm: Normal sinus rhythm  Axis: Normal  Intervals: QTC at 450  ST&T Change:  None  noted Left ventricular hypertrophy   ____________________________________________    RADIOLOGY  Chest x-ray: Normal CT head: - No acute changes.  ____________________________________________  CRITICAL CARE Performed by: Ahmed Prima   Total critical care time: 35 minutes due to the patient's acute CVA, critical care status, and discussions with inpatient physicians.  Critical care time was exclusive of separately billable procedures and treating other patients.  Critical care was necessary to treat or prevent  imminent or life-threatening deterioration.  Critical care was time spent personally by me on the following activities: development of treatment plan with patient and/or surrogate as well as nursing, discussions with consultants, evaluation of patient's response to treatment, examination of patient, obtaining history from patient or surrogate, ordering and performing treatments and interventions, ordering and review of laboratory studies, ordering and review of radiographic studies, pulse oximetry and re-evaluation of patient's condition.   ____________________________________________   INITIAL IMPRESSION / ASSESSMENT AND PLAN / ED COURSE  Patient with subtle but notable weakness on the left side including left arm left leg and with paresthesias in the left face and left arm. The patient is likely having an acute stroke. She is Artie taken aspirin. A CT scan is pending. We will treat her blood pressure with IV labetalol and clonidine 0.1 mg by mouth now.  ----------------------------------------- 9:56 PM on 11/23/2014 -----------------------------------------  After the initial treatment for blood pressure her blood pressure remained high. She was given a second dose of IV labetalol. We discussed the case with Dr. Bridgett Larsson for admission to the hospital.  The patient's NIH score is less than 4. Due to this and the unclear time frame, she was not a candidate for lytic  treatment.  ____________________________________________   FINAL CLINICAL IMPRESSION(S) / ED DIAGNOSES  Final diagnoses:  Acute CVA (cerebrovascular accident)  Left-sided weakness  Other chest pain  Uncontrolled hypertension      Ahmed Prima, MD 11/23/14 2159

## 2014-11-23 NOTE — ED Notes (Signed)
Pt taken to CT.

## 2014-11-23 NOTE — H&P (Signed)
[place] [facility] [place] NAME: Theresa [name]    MR#:  [id number]  DATE OF BIRTH:  09-26-1955  DATE OF ADMISSION:  11/23/2014  PRIMARY CARE PHYSICIAN: Rica Mast, MD   REQUESTING/REFERRING PHYSICIAN: Ahmed Prima, MD  CHIEF COMPLAINT:   Chief Complaint  Patient presents with  . Chest Pain  . Numbness   left-sided numbness, tingling and weakness today.  HISTORY OF PRESENT ILLNESS:  Theresa [name]  is a 59 y.o. female with a known history of hypertension, diabetes, ESRD on hemodialysis, COPD and PVD. The patient came to the ED due to left-sided numbness, tingling and weakness today. The patient is alert, awake and oriented in no acute distress. She said that she had left-sided facial numbness 5 days ago and  started to have left arm numbness, tingling and weakness. In addition, she has  left leg weakness. She also complains of chest pain on the left side with the left arm numbness and tingling. She also complains of headache last night. She was treated with aspirin 81 mg 4 tablets by EMS. Her systolic blood pressure is high more than 200 the ED. Her CAT scan of head is negative for CVA. She said her Plavix was discontinued this February for preparation for kidney transplant.  PAST MEDICAL HISTORY:   Past Medical History  Diagnosis Date  . Hypertension   . ESRD on hemodialysis     a. [facility] in [place], [place], on a TTS schedule.  She started dialysis in Feb 2014.  Etiology of renal failure not known, likely diabetes.  Has a left upper arm AV graft.  Marland Kitchen COPD (chronic obstructive pulmonary disease)   . Emphysema   . History of pneumonia     June 2012  . History of bronchitis     Mar 2012  . Peripheral vascular disease   . Chronic constipation   . Colon polyps   . Sickle cell trait   . Anemia of chronic disease   . Diabetes mellitus   . Moderate mitral insufficiency     a. 10/2013 Echo: EF 55-60%, mod MR.  . Diabetic  neuropathy   . Diabetic retinopathy 05/28/2013    Hx bilat retinal detachment, proliferative diab retinopathy and bilat vitreous hemorrhage   . Hyperlipidemia   . Coronary artery disease     a. 05/2013 NSTEMI/PCI: LM 20d, LAD min irregs, LCX small, nl, OM1 nl, RCA dom 39m(2.5x16 Promus DES), PDA1 80p.  . Chronic diastolic CHF (congestive heart failure)     a. 10/2013 Echo ([facility]: EF 55-60%, mod conc LVH, mod MR, mildly dil LA, mild Ao sclerosis w/o stenosis.  . Carotid arterial disease     a. 02/2013 U/S: 40-59% bilat ICA stenosis - *f/u 02/2014*  . History of tobacco abuse     a. Quit 2012.    PAST SURGICAL HISTORY:   Past Surgical History  Procedure Laterality Date  . Abdominal hysterectomy      2000  . Dilation and curettage of uterus      several in the early 80's  . Eye surgery      bilateral laser 2012  . Esophagogastroduodenoscopy      2012  . Colonscopy    . Tubal ligation      1979  . Pars plana vitrectomy  04/22/2011    Procedure: PARS PLANA VITRECTOMY WITH 25 GAUGE;  Surgeon: JHayden Pedro MD;  [place]  Service: Ophthalmology;  Laterality: Left;  membrane peel, endolaser, gas fluid exchange, silicone oil, repair of complex traction retinal detachment  . Pars plana vitrectomy  09/30/2011    Procedure: PARS PLANA VITRECTOMY WITH 25 GAUGE;  Surgeon: [name], MD;  Location: [place];  Service: Ophthalmology;  Laterality: Right;  Endolaser; Repair of Complex Traction Retinal Detachment  . Gas/fluid exchange  09/30/2011    Procedure: GAS/FLUID EXCHANGE;  Surgeon: [name], MD;  Location: [place];  Service: Ophthalmology;  Laterality: Right;  . Gas insertion  09/30/2011    Procedure: INSERTION OF GAS;  Surgeon: [name], MD;  Location: [place];  Service: Ophthalmology;  Laterality: Right;  [id number]  . Eye surgery      right  . Pars plana vitrectomy  02/24/2012    Procedure: PARS PLANA VITRECTOMY WITH 25 GAUGE;  Surgeon: [name], MD;  Location: [place];  Service: Ophthalmology;  Laterality: Left;  . Silicon oil removal  0/76/8088    Procedure: SILICON OIL REMOVAL;  Surgeon: [name], MD;  Location: [place];  Service: Ophthalmology;  Laterality: Left;  . Ptca    . Thrombectomy / arteriovenous graft revision    . Cardiac catheterization    . Coronary angioplasty  05/28/2014    stent placement to the mid RCA  . Left heart catheterization with coronary angiogram N/A 05/28/2013    Procedure: LEFT HEART CATHETERIZATION WITH CORONARY ANGIOGRAM;  Surgeon: [name], MD;  Location: [facility];  Service: Cardiovascular;  Laterality: N/A;    SOCIAL HISTORY:   History  Substance Use Topics  . Smoking status: Former Smoker -- 1.00 packs/day for 25 years    Quit date: 06/09/2010  . Smokeless tobacco: Never Used  . Alcohol Use: No   no illicit drug  FAMILY HISTORY:   Family History  Problem Relation Age of Onset  . Anesthesia problems Neg Hx   . Hypotension Neg Hx   . Malignant hyperthermia Neg Hx   . Pseudochol deficiency Neg Hx   . Stroke Mother   . Heart attack Mother   . Heart disease Mother   . Colon cancer Father   . Colon cancer Sister   . Heart attack Brother   . Stroke Brother   . Diabetes Brother   . Breast cancer Sister   . Diabetes Brother   . Diabetes Daughter     DRUG ALLERGIES:   Allergies  Allergen Reactions  . Metformin Diarrhea  . Hydrocodone Hives    REVIEW OF SYSTEMS:  CONSTITUTIONAL: No fever, fatigue or weakness.  EYES: No blurred or double vision.  EARS, NOSE, AND THROAT: No tinnitus or ear pain.  RESPIRATORY: No cough, shortness of breath, wheezing or hemoptysis.  CARDIOVASCULAR: Positive chest pain, no orthopnea or edema.  GASTROINTESTINAL: No nausea, vomiting, diarrhea or abdominal pain.  GENITOURINARY: No dysuria, hematuria.  ENDOCRINE: No polyuria, nocturia,  HEMATOLOGY: No anemia, easy bruising or bleeding SKIN: No rash or lesion. MUSCULOSKELETAL: No joint pain or  arthritis.   NEUROLOGIC: Positive tingling, numbness, weakness of left arm and leg. No seizure, loss of consciousness or incontinence. No slurred speech or dysphagia. PSYCHIATRY: No anxiety or depression.   MEDICATIONS AT HOME:   Prior to Admission medications   Medication Sig Start Date End Date Taking? Authorizing Provider  albuterol (PROVENTIL HFA;VENTOLIN HFA) 108 (90 BASE) MCG/ACT inhaler Inhale 2 puffs into the lungs every 6 (six) hours as needed for wheezing or shortness of breath.   Yes Historical Provider, MD  Alcohol Swabs PADS Patient uses at home to assist with testing blood sugars twice daily. Patient taking differently: 1 each by Other route 2 (two) times daily. Pt uses when testing blood sugars. 07/25/13  Yes Jackolyn Confer, MD  aspirin EC 81 MG tablet Take 81 mg by mouth daily.   Yes Historical Provider, MD  b complex-vitamin c-folic acid (NEPHRO-VITE) 0.8 MG TABS tablet Take 1 tablet by mouth daily.   Yes Historical Provider, MD  BAYER CONTOUR TEST test strip TEST BLOOD SUGARS 2 TIMES DAILY 09/26/13  Yes Jackolyn Confer, MD  Blood Glucose Monitoring Suppl (ONE TOUCH ULTRA SYSTEM KIT) W/DEVICE KIT Please dispense for patient to use at home to check blood sugars. Patient taking differently: 1 kit by Other route See admin instructions. Pt uses to test blood sugars. 07/27/13  Yes Jackolyn Confer, MD  calcium acetate (PHOSLO) 667 MG capsule Take [phone] mg by mouth 5 (five) times daily. Pt takes four capsules with meals and two capsules with snacks.   Yes Historical Provider, MD  carvedilol (COREG) 12.5 MG tablet Take 1 tablet (12.5 mg total) by mouth 2 (two) times daily. 06/03/13  Yes Rhonda G Barrett, PA-C  cloNIDine (CATAPRES) 0.1 MG tablet Take 0.1 mg by mouth daily.   Yes Historical Provider, MD  Insulin Detemir (LEVEMIR) 100 UNIT/ML Pen Inject 5 Units into the skin daily. 03/28/14  Yes Jackolyn Confer, MD  Lancets Thin MISC Using at home to test blood sugars twice  daily. Patient taking differently: 1 each by Other route 2 (two) times daily. Pt uses to test blood sugars. 07/25/13  Yes Jackolyn Confer, MD  mometasone (NASONEX) 50 MCG/ACT nasal spray Place 2 sprays into the nose 2 (two) times daily as needed (for allergies).   Yes Historical Provider, MD  nitroGLYCERIN (NITROSTAT) 0.4 MG SL tablet Place 1 tablet (0.4 mg total) under the tongue every 5 (five) minutes as needed for chest pain. 06/03/13  Yes Rhonda G Barrett, PA-C  NOVOFINE 32G X 6 MM MISC USE WITH INUSLIN AS NEEDED Patient taking differently: USE WITH INSULIN AS NEEDED 08/28/14  Yes Jackolyn Confer, MD  oxyCODONE-acetaminophen (ROXICET) 5-325 MG per tablet Take 1 tablet by mouth every 8 (eight) hours as needed for severe pain. 09/12/14  Yes Jackolyn Confer, MD  pregabalin (LYRICA) 75 MG capsule Take 1 capsule (75 mg total) by mouth daily. 03/28/14  Yes Jackolyn Confer, MD  rosuvastatin (CRESTOR) 40 MG tablet Take 1 tablet (40 mg total) by mouth daily. 02/02/14  Yes Minna Merritts, MD  lidocaine (XYLOCAINE) 4 % external solution Apply topically 3 (three) times daily as needed. Patient not taking: Reported on 11/23/2014 09/22/13   Owens Loffler, MD      VITAL SIGNS:  Blood pressure 210/71, pulse 72, temperature 98.3 F (36.8 C), temperature source Oral, resp. rate 16, height _0  (1.702 m), weight 78.019 kg (172 lb), SpO2 98 %.  PHYSICAL EXAMINATION:  GENERAL:  59 y.o.-year-old patient lying in the bed with no acute distress.  EYES: Pupils equal, round, reactive to light and accommodation. No scleral icterus. Extraocular muscles intact.  HEENT: Head atraumatic, normocephalic. Oropharynx and nasopharynx clear.  NECK:  Supple, no jugular venous distention. No thyroid enlargement, no tenderness.  LUNGS: Normal breath sounds bilaterally, no wheezing, rales,rhonchi or crepitation. No use of accessory muscles of respiration.  CARDIOVASCULAR: S1, S2 normal. No murmurs, rubs, or gallops.   ABDOMEN: Soft, nontender, nondistended. Bowel sounds present. No organomegaly or  mass.  EXTREMITIES: No pedal edema, cyanosis, or clubbing.  NEUROLOGIC: Cranial nerves II through XII are intact. Muscle strength 4/5 in left leg and 5 out of 5 in right leg and both arms. Sensation intact. Gait not checked.  PSYCHIATRIC: The patient is alert and oriented x 3.  SKIN: No obvious rash, lesion, or ulcer.   LABORATORY PANEL:   CBC  Recent Labs Lab 11/23/14 1921  WBC 9.2  HGB 11.1*  HCT 32.7*  PLT 152   ------------------------------------------------------------------------------------------------------------------  Chemistries   Recent Labs Lab 11/23/14 1921  NA 138  K 4.6  CL 94*  CO2 33*  GLUCOSE 141*  BUN 36*  CREATININE 7.90*  CALCIUM 9.0   ------------------------------------------------------------------------------------------------------------------  Cardiac Enzymes  Recent Labs Lab 11/23/14 1921  TROPONINI <0.03   ------------------------------------------------------------------------------------------------------------------  RADIOLOGY:  Ct Head Wo Contrast  11/23/2014   CLINICAL DATA:  Acute onset of left-sided facial numbness, and chest pain radiating to the left arm and neck. Initial encounter.  EXAM: CT HEAD WITHOUT CONTRAST  TECHNIQUE: Contiguous axial images were obtained from the base of the skull through the vertex without intravenous contrast.  COMPARISON:  CT of the head performed 09/25/2012  FINDINGS: There is no evidence of acute infarction, mass lesion, or intra- or extra-axial hemorrhage on CT.  Mild periventricular white matter change likely reflects small vessel ischemic microangiopathy. Calcification is noted at the basal ganglia bilaterally. Mild cerebellar atrophy is noted.  The brainstem and fourth ventricle are within normal limits. The third and lateral ventricles, and basal ganglia are unremarkable in appearance. The cerebral hemispheres are  symmetric in appearance, with normal gray-white differentiation. No mass effect or midline shift is seen.  There is no evidence of fracture; visualized osseous structures are unremarkable in appearance. The visualized portions of the orbits are within normal limits. The paranasal sinuses and mastoid air cells are well-aerated. No significant soft tissue abnormalities are seen.  IMPRESSION: 1. No acute intracranial pathology seen on CT. 2. Mild small vessel ischemic microangiopathy.   Electronically Signed   By: Garald Balding M.D.   On: 11/23/2014 20:33   Dg Chest Port 1 View  11/23/2014   CLINICAL DATA:  Chest pain  EXAM: PORTABLE CHEST - 1 VIEW  COMPARISON:  05/12/2014  FINDINGS: The heart size and mediastinal contours are within normal limits. Both lungs are clear. The visualized skeletal structures are unremarkable.  IMPRESSION: No active disease.   Electronically Signed   By: Inez Catalina M.D.   On: 11/23/2014 19:51    EKG:   Orders placed or performed during the hospital encounter of 11/23/14  . ED EKG (<55mns upon arrival to the ED)  . ED EKG (<178ms upon arrival to the ED)    IMPRESSION AND PLAN:   Acute CVA Malignant hypertension ESRD on hemodialysis Diabetes CAD Hyperlipidemia Chronic diastolic CHF with ejection fraction 45-50%  The patient will be admitted to telemetry floor and continued telemetry monitor. I will start CVAT protocol. The patient was treated with aspirin 324 mg today. I will continue aspirin and Crestor, get an MRI and I MRI of the brain, carotid ultrasound and echocardiogram. I will get a PT and OT and speech study. For malignant hypertension, I will continue patient home hypertension medication including clonidine and Lopressor with hydralazine IV when necessary. We'll request a nephrology consult to continue hemodialysis. For diabetes, I will start a sliding scale and continue Levemir.   All the records are reviewed and case discussed with ED  provider. Management  plans discussed with the patient, family and they are in agreement.  CODE STATUS: Full code  TOTAL TIME TAKING CARE OF THIS PATIENT: 56 minutes.    Demetrios Loll M.D on 11/23/2014 at 10:02 PM  Between 7am to 6pm - Pager - [phone]  After 6pm go to [email or web address] - password [email or web address] [facility]  Office  [phone]  CC: Primary care physician; Rica Mast, MD

## 2014-11-24 ENCOUNTER — Inpatient Hospital Stay: Payer: Medicare Other

## 2014-11-24 ENCOUNTER — Inpatient Hospital Stay (HOSPITAL_COMMUNITY)
Admit: 2014-11-24 | Discharge: 2014-11-24 | Disposition: A | Discharge status: Home / Self Care | Payer: Medicare Other | Attending: Internal Medicine | Admitting: Internal Medicine

## 2014-11-24 DIAGNOSIS — I340 Nonrheumatic mitral (valve) insufficiency: Secondary | ICD-10-CM

## 2014-11-24 DIAGNOSIS — I639 Cerebral infarction, unspecified: Secondary | ICD-10-CM

## 2014-11-24 LAB — LIPID PANEL
CHOLESTEROL: 125 mg/dL (ref 0–200)
HDL: 59 mg/dL (ref >40)
LDL Cholesterol: 52 mg/dL (ref 0–99)
Total CHOL/HDL Ratio: 2.1 RATIO
Triglycerides: 70 mg/dL (ref <150)
VLDL: 14 mg/dL (ref 0–40)

## 2014-11-24 LAB — GLUCOSE, CAPILLARY
Glucose-Capillary: 116 mg/dL — ABNORMAL HIGH (ref 65–99)
Glucose-Capillary: 88 mg/dL (ref 65–99)
Glucose-Capillary: 250 mg/dL — ABNORMAL HIGH (ref 65–99)
Glucose-Capillary: 197 mg/dL — ABNORMAL HIGH (ref 65–99)

## 2014-11-24 LAB — HEMOGLOBIN A1C: Hgb A1c MFr Bld: 5.5 % (ref 4.0–6.0)

## 2014-11-24 MED ORDER — NITROGLYCERIN 2 % TD OINT
0.5000 [in_us] | TOPICAL_OINTMENT | Freq: Four times a day (QID) | TRANSDERMAL | Status: DC
  Administered 2014-11-24 (×2): 0.5 [in_us] via TOPICAL
  Administered 2014-11-25 (×2): 1 [in_us] via TOPICAL
  Filled 2014-11-24 (×4): qty 1

## 2014-11-24 MED ORDER — ASPIRIN EC 325 MG PO TBEC
DELAYED_RELEASE_TABLET | ORAL | Status: AC
  Administered 2014-11-24: 16:00:00
  Filled 2014-11-24: qty 1

## 2014-11-24 NOTE — Progress Notes (Signed)
NSR. Room air. Went to HD and 1 L was removed. Pt has not reported any pain. FS are stable. MRI/ MRA neg. Trop neg. US carotid neg. A& O. Uses walker to the BR. Fistula to R arm. CT head neg. Pt has no further concerns at this time.

## 2014-11-24 NOTE — Progress Notes (Signed)
*  PRELIMINARY RESULTS* Echocardiogram 2D Echocardiogram has been performed.  Theresa [name] 11/24/2014, 11:13 AM

## 2014-11-24 NOTE — Progress Notes (Signed)
*  PRELIMINARY RESULTS* Echocardiogram 2D Echocardiogram has been performed.  Theresa [name] 11/24/2014, 1:43 PM

## 2014-11-24 NOTE — Progress Notes (Signed)
This is to inform Ms Theresa, [name] is admitted in hospital. Mr. Theresa [name] , DOB- [birth year]- was in hospital in noon and afternoon hours- to see her. Thanks. -Dr. Anselm Jungling

## 2014-11-24 NOTE — Progress Notes (Signed)
OT Cancellation Note  Patient Details Name: Theresa [name] MRN: [id number] DOB: 27-Mar-1956   Cancelled Treatment:     Attempted Occupational Therapy evaluation. Patient just left for dialysis. Will re-attempt time permitting.  Sharon Mt 11/24/2014, 11:28 AM

## 2014-11-24 NOTE — Evaluation (Signed)
Occupational Therapy Evaluation Patient Details Name: Theresa [name] MRN: [id number] DOB: [birth year] Today's Date: 11/24/2014    History of Present Illness This patient is a 59 year old female who came to [facility] for L sided weakness. MRI negative for a CVA   Clinical Impression   This patient is a 59 year old female who came to [facility] with above complaint. She shows minimal deficits in L UE. Mild sensation deficits as compared to right include sharp, stereognosis, light touch and temperature. Fine motor appears normal as 9 hole peg test is with in 1/2 second (R 29.51 seconds L 30.30 seconds. She can dress, use the bathroom and eat independently with assistive devices.  This hand is improving as compared to yesterday as per patient report. No further Occupational Therapy needed at this time.       Follow Up Recommendations   (No follow up at this time)    Equipment Recommendations       Recommendations for Other Services       Precautions / Restrictions Precautions Precautions: Fall Restrictions Weight Bearing Restrictions: No      Mobility Bed Mobility Overal bed mobility: Independent                Transfers Overall transfer level: Independent (observation) Equipment used:  (Gate belt (observed with physical therapy))                  Balance                                          ADL                                         General ADL Comments: [place] Independent with ADL including driving - continues to be independent with basic ADL (dressing, getting to the bathroom)     Vision     Perception     Praxis      Pertinent Vitals/Pain       Hand Dominance Right   Extremity/Trunk Assessment Upper Extremity Assessment Upper Extremity Assessment:  (R UE 5/5 through out grip 60 lbs - L UE 4/5 grip 45 lbs  (decreased endurance))         Communication Communication Communication: No difficulties   Cognition  Arousal/Alertness: Awake/alert Behavior During Therapy: WFL for tasks assessed/performed Overall Cognitive Status: Within Functional Limits for tasks assessed                     General Comments       Exercises       Shoulder Instructions      Home Living Family/patient expects to be discharged to:: Private residence Living Arrangements: Alone Available Help at Discharge: Family;Friend(s) Type of Home: House Home Access:  (No stairs to enter) Entrance Stairs-Number of Steps: 0   Home Layout: One level               Home Equipment: None          Prior Functioning/Environment Level of Independence: Independent            OT Diagnosis: Paresis   OT Problem List:     OT Treatment/Interventions:      OT Goals(Current goals can be found in the care  plan section) Acute Rehab OT Goals home Patient Stated Goal: Go home  OT Frequency:     Barriers to D/C:            Co-evaluation              End of Session    Activity Tolerance: Patient tolerated treatment well Patient left:     Time: [id number] OT Time Calculation (min): 15 min Charges:  OT Evaluation $Initial OT Evaluation Tier I: 1 Procedure G-Codes:    Myrene Galas, MS/OTR/L  11/24/2014, 4:29 PM

## 2014-11-24 NOTE — Consult Note (Signed)
CC: L sided numbness   HPI: Theresa [name] is an 59 y.o. female with a known history of hypertension, diabetes, ESRD on hemodialysis, COPD and PVD. The patient came to the ED due to left-sided numbness, tingling and weakness today that has started at end of last week. MRI brain no acute abnormality. MRA R vertebral dominant.   Past Medical History  Diagnosis Date  . Hypertension   . ESRD on hemodialysis     a. [facility] in [place], [place], on a TTS schedule.  She started dialysis in Feb 2014.  Etiology of renal failure not known, likely diabetes.  Has a left upper arm AV graft.  Marland Kitchen COPD (chronic obstructive pulmonary disease)   . Emphysema   . History of pneumonia     June 2012  . History of bronchitis     Mar 2012  . Peripheral vascular disease   . Chronic constipation   . Colon polyps   . Sickle cell trait   . Anemia of chronic disease   . Diabetes mellitus   . Moderate mitral insufficiency     a. 10/2013 Echo: EF 55-60%, mod MR.  . Diabetic neuropathy   . Diabetic retinopathy 05/28/2013    Hx bilat retinal detachment, proliferative diab retinopathy and bilat vitreous hemorrhage   . Hyperlipidemia   . Coronary artery disease     a. 05/2013 NSTEMI/PCI: LM 20d, LAD min irregs, LCX small, nl, OM1 nl, RCA dom 13m (2.5x16 Promus DES), PDA1 80p.  . Chronic diastolic CHF (congestive heart failure)     a. 10/2013 Echo [facility]): EF 55-60%, mod conc LVH, mod MR, mildly dil LA, mild Ao sclerosis w/o stenosis.  . Carotid arterial disease     a. 02/2013 U/S: 40-59% bilat ICA stenosis - *f/u 02/2014*  . History of tobacco abuse     a. Quit 2012.    Past Surgical History  Procedure Laterality Date  . Abdominal hysterectomy      2000  . Dilation and curettage of uterus      several in the early 80's  . Eye surgery      bilateral laser 2012  . Esophagogastroduodenoscopy      2012  . Colonscopy    . Tubal ligation      1979  . Pars plana vitrectomy  04/22/2011    Procedure: PARS PLANA  VITRECTOMY WITH 25 GAUGE;  Surgeon: [name], MD;  Location: [facility];  Service: Ophthalmology;  Laterality: Left;  membrane peel, endolaser, gas fluid exchange, silicone oil, repair of complex traction retinal detachment  . Pars plana vitrectomy  09/30/2011    Procedure: PARS PLANA VITRECTOMY WITH 25 GAUGE;  Surgeon: [name], MD;  Location: [facility];  Service: Ophthalmology;  Laterality: Right;  Endolaser; Repair of Complex Traction Retinal Detachment  . Gas/fluid exchange  09/30/2011    Procedure: GAS/FLUID EXCHANGE;  Surgeon: [name], MD;  Location: [facility];  Service: Ophthalmology;  Laterality: Right;  . Gas insertion  09/30/2011    Procedure: INSERTION OF GAS;  Surgeon: [name], MD;  Location: [facility];  Service: Ophthalmology;  Laterality: Right;  [id number]  . Eye surgery      right  . Pars plana vitrectomy  02/24/2012    Procedure: PARS PLANA VITRECTOMY WITH 25 GAUGE;  Surgeon: [name], MD;  Location: [facility];  Service: Ophthalmology;  Laterality: Left;  . Silicon oil removal  [id number]    Procedure: SILICON OIL REMOVAL;  Surgeon: [name], MD;  Location: [facility];  Service: Ophthalmology;  Laterality: Left;  . Ptca    . Thrombectomy / arteriovenous graft revision    . Cardiac catheterization    . Coronary angioplasty  05/28/2014    stent placement to the mid RCA  . Left heart catheterization with coronary angiogram N/A 05/28/2013    Procedure: LEFT HEART CATHETERIZATION WITH CORONARY ANGIOGRAM;  Surgeon: [name], MD;  Location: [facility] CATH LAB;  Service: Cardiovascular;  Laterality: N/A;    Family History  Problem Relation Age of Onset  . Anesthesia problems Neg Hx   . Hypotension Neg Hx   . Malignant hyperthermia Neg Hx   . Pseudochol deficiency Neg Hx   . Stroke Mother   . Heart attack Mother   . Heart disease Mother   . Colon cancer Father   . Colon cancer Sister   . Heart attack Brother   . Stroke Brother   . Diabetes Brother   . Breast  cancer Sister   . Diabetes Brother   . Diabetes Daughter     Social History:  reports that she quit smoking about 4 years ago. She has never used smokeless tobacco. She reports that she does not drink alcohol or use illicit drugs.  Allergies  Allergen Reactions  . Metformin Diarrhea  . Hydrocodone Hives    Medications: I have reviewed the patient's current medications.  ROS: History obtained from the patient  General ROS: negative for - chills, fatigue, fever, night sweats, weight gain or weight loss Psychological ROS: negative for - behavioral disorder, hallucinations, memory difficulties, mood swings or suicidal ideation Ophthalmic ROS: negative for - blurry vision, double vision, eye pain or loss of vision ENT ROS: negative for - epistaxis, nasal discharge, oral lesions, sore throat, tinnitus or vertigo Allergy and Immunology ROS: negative for - hives or itchy/watery eyes Hematological and Lymphatic ROS: negative for - bleeding problems, bruising or swollen lymph nodes Endocrine ROS: negative for - galactorrhea, hair pattern changes, polydipsia/polyuria or temperature intolerance Respiratory ROS: negative for - cough, hemoptysis, shortness of breath or wheezing Cardiovascular ROS: negative for - chest pain, dyspnea on exertion, edema or irregular heartbeat Gastrointestinal ROS: negative for - abdominal pain, diarrhea, hematemesis, nausea/vomiting or stool incontinence Genito-Urinary ROS: negative for - dysuria, hematuria, incontinence or urinary frequency/urgency Musculoskeletal ROS: negative for - joint swelling or muscular weakness Neurological ROS: as noted in HPI Dermatological ROS: negative for rash and skin lesion changes  Physical Examination: Blood pressure 185/55, pulse 74, temperature 98.7 F (37.1 C), temperature source Oral, resp. rate 18, height 5\' 7"  (1.702 m), weight 75.4 kg (166 lb 3.6 oz), SpO2 99 %.   Neurological Examination Mental Status: Alert,  oriented, thought content appropriate.  Speech fluent without evidence of aphasia.  Able to follow 3 step commands without difficulty. Cranial Nerves: II: Discs flat bilaterally; Visual fields grossly normal, pupils equal, round, reactive to light and accommodation III,IV, VI: ptosis not present, extra-ocular motions intact bilaterally V,VII: smile symmetric, facial light touch sensation normal bilaterally VIII: hearing normal bilaterally IX,X: gag reflex present XI: bilateral shoulder shrug XII: midline tongue extension Motor: Right : Upper extremity   5/5    Left:     Upper extremity   5/5  Lower extremity   4/5     Lower extremity   4/5 Tone and bulk:normal tone throughout; no atrophy noted Sensory: Pinprick and light touch intact throughout, bilaterally Deep Tendon Reflexes: 1+ and symmetric throughout Plantars: Right:  downgoing   Left: downgoing Cerebellar: normal finger-to-nose, normal rapid alternating movements and normal heel-to-shin test Gait: normal gait and station      Laboratory Studies:   Basic Metabolic Panel:  Recent Labs Lab 11/23/14 1921  NA 138  K 4.6  CL 94*  CO2 33*  GLUCOSE 141*  BUN 36*  CREATININE 7.90*  CALCIUM 9.0    Liver Function Tests: No results for input(s): AST, ALT, ALKPHOS, BILITOT, PROT, ALBUMIN in the last 168 hours. No results for input(s): LIPASE, AMYLASE in the last 168 hours. No results for input(s): AMMONIA in the last 168 hours.  CBC:  Recent Labs Lab 11/23/14 1921  WBC 9.2  HGB 11.1*  HCT 32.7*  MCV 103.5*  PLT 152    Cardiac Enzymes:  Recent Labs Lab 11/23/14 1921  TROPONINI <0.03    BNP: Invalid input(s): POCBNP  CBG:  Recent Labs Lab 11/23/14 2251 11/24/14 0733 11/24/14 1539  GLUCAP 149* 116* 88    Microbiology: Results for orders placed or performed in visit on 02/13/14  Culture, blood (single)     Status: None   Collection Time: 02/13/14  3:00 AM  Result Value Ref Range Status   Micro  Text Report   Final       COMMENT                   NO GROWTH AEROBICALLY/ANAEROBICALLY IN 5 DAYS   ANTIBIOTIC                                                      Culture, blood (single)     Status: None   Collection Time: 02/13/14  3:15 AM  Result Value Ref Range Status   Micro Text Report   Final       COMMENT                   NO GROWTH AEROBICALLY/ANAEROBICALLY IN 5 DAYS   ANTIBIOTIC                                                        Coagulation Studies: No results for input(s): LABPROT, INR in the last 72 hours.  Urinalysis: No results for input(s): COLORURINE, LABSPEC, PHURINE, GLUCOSEU, HGBUR, BILIRUBINUR, KETONESUR, PROTEINUR, UROBILINOGEN, NITRITE, LEUKOCYTESUR in the last 168 hours.  Invalid input(s): APPERANCEUR  Lipid Panel:     Component Value Date/Time   CHOL 125 11/24/2014 0349   TRIG 70 11/24/2014 0349   HDL 59 11/24/2014 0349   CHOLHDL 2.1 11/24/2014 0349   VLDL 14 11/24/2014 0349   LDLCALC 52 11/24/2014 0349    HgbA1C:  Lab Results  Component Value Date   HGBA1C 5.5 11/24/2014    Urine Drug Screen:  No results found for: LABOPIA, COCAINSCRNUR, LABBENZ, AMPHETMU, THCU, LABBARB  Alcohol Level: No results for input(s): ETH in the last 168 hours.  Other results:   Imaging: Ct Head Wo Contrast  11/23/2014   CLINICAL DATA:  Acute onset of left-sided facial numbness, and chest pain radiating to the left arm and neck. Initial encounter.  EXAM: CT HEAD WITHOUT CONTRAST  TECHNIQUE: Contiguous axial images were obtained from  the base of the skull through the vertex without intravenous contrast.  COMPARISON:  CT of the head performed 09/25/2012  FINDINGS: There is no evidence of acute infarction, mass lesion, or intra- or extra-axial hemorrhage on CT.  Mild periventricular white matter change likely reflects small vessel ischemic microangiopathy. Calcification is noted at the basal ganglia bilaterally. Mild cerebellar atrophy is noted.  The brainstem and  fourth ventricle are within normal limits. The third and lateral ventricles, and basal ganglia are unremarkable in appearance. The cerebral hemispheres are symmetric in appearance, with normal gray-white differentiation. No mass effect or midline shift is seen.  There is no evidence of fracture; visualized osseous structures are unremarkable in appearance. The visualized portions of the orbits are within normal limits. The paranasal sinuses and mastoid air cells are well-aerated. No significant soft tissue abnormalities are seen.  IMPRESSION: 1. No acute intracranial pathology seen on CT. 2. Mild small vessel ischemic microangiopathy.   Electronically Signed   By: Garald Balding M.D.   On: 11/23/2014 20:33   Mr Brain Wo Contrast  11/24/2014   CLINICAL DATA:  Left sided numbness and weakness, 1 day duration.  EXAM: MRI HEAD WITHOUT CONTRAST  MRA HEAD WITHOUT CONTRAST  TECHNIQUE: Multiplanar, multiecho pulse sequences of the brain and surrounding structures were obtained without intravenous contrast. Angiographic images of the head were obtained using MRA technique without contrast.  COMPARISON:  Head CT 11/23/2014  FINDINGS: MRI HEAD FINDINGS  Diffusion imaging does not show any acute or subacute infarction. There chronic small-vessel ischemic changes affecting the pons. There are mild chronic small-vessel changes affecting the cerebral hemispheric white matter. No cortical or large vessel territory infarction. No mass lesion, hemorrhage, hydrocephalus or extra-axial collection. No pituitary mass. No inflammatory sinus disease. No skull or skullbase lesion.  MRA HEAD FINDINGS  Both internal carotid arteries are patent into the brain. There is mild narrowing and irregularity in the siphon regions, right more than left. The anterior and middle cerebral vessels are patent without proximal stenosis, aneurysm or vascular malformation.  The right vertebral artery is the dominant vessel and is widely patent to the  basilar. The left vertebral artery is non dominant and shows diminished flow at the foramen magnum level. Distal vertebral artery shows a normal flow appearance. Particularly given that there is calcification in the major vessels the base of the brain on head CT, I think this is probably a disease vessel segment. The basilar artery is widely patent. Superior cerebellar and posterior cerebral arteries appear normal.  IMPRESSION: No acute or subacute infarction. Chronic small-vessel ischemic changes of the pons in the cerebral hemispheric white matter.  Atherosclerotic disease in the carotid siphon regions without flow limiting stenosis. Abnormal appearance of the distal left vertebral artery with diminished flow likely secondary to atherosclerotic disease. No major intracranial branch vessel occlusion.   Electronically Signed   By: Nelson Chimes M.D.   On: 11/24/2014 10:15   US Carotid Bilateral  11/24/2014   CLINICAL DATA:  Left-sided weakness.  EXAM: BILATERAL CAROTID DUPLEX ULTRASOUND  TECHNIQUE: Pearline Cables scale imaging, color Doppler and duplex ultrasound were performed of bilateral carotid and vertebral arteries in the neck.  COMPARISON:  None.  FINDINGS: Criteria: Quantification of carotid stenosis is based on velocity parameters that correlate the residual internal carotid diameter with NASCET-based stenosis levels, using the diameter of the distal internal carotid lumen as the denominator for stenosis measurement.  The following velocity measurements were obtained:  RIGHT  ICA:  176/33 cm/sec  CCA:  [id number] cm/sec  SYSTOLIC ICA/CCA RATIO:  1.6  DIASTOLIC ICA/CCA RATIO:  2.4  ECA:  263 cm/sec  LEFT  ICA:  206/30 cm/sec  CCA:  [id number] cm/sec  SYSTOLIC ICA/CCA RATIO:  2.1  DIASTOLIC ICA/CCA RATIO:  1.5  ECA:  297 cm/sec  RIGHT CAROTID ARTERY: Mild plaque right carotid bulb. No flow limiting stenosis. Waveforms are unremarkable.  RIGHT VERTEBRAL ARTERY:  Patent antegrade flow .  LEFT CAROTID ARTERY: Mild plaque left  carotid bulb. No flow limiting stenosis. Waveforms are unremarkable.  LEFT VERTEBRAL ARTERY:  Patent antegrade flow.  IMPRESSION: 1. Mild bilateral carotid bifurcation plaque. No flow limiting stenosis. Degree of stenosis less than 50%. 2. Vertebral arteries are patent with antegrade flow.   Electronically Signed   By: Marcello Moores  Register   On: 11/24/2014 11:32   Dg Chest Port 1 View  11/23/2014   CLINICAL DATA:  Chest pain  EXAM: PORTABLE CHEST - 1 VIEW  COMPARISON:  05/12/2014  FINDINGS: The heart size and mediastinal contours are within normal limits. Both lungs are clear. The visualized skeletal structures are unremarkable.  IMPRESSION: No active disease.   Electronically Signed   By: Inez Catalina M.D.   On: 11/23/2014 19:51   Mr Jodene Nam Head/brain Wo Cm  11/24/2014   CLINICAL DATA:  Left sided numbness and weakness, 1 day duration.  EXAM: MRI HEAD WITHOUT CONTRAST  MRA HEAD WITHOUT CONTRAST  TECHNIQUE: Multiplanar, multiecho pulse sequences of the brain and surrounding structures were obtained without intravenous contrast. Angiographic images of the head were obtained using MRA technique without contrast.  COMPARISON:  Head CT 11/23/2014  FINDINGS: MRI HEAD FINDINGS  Diffusion imaging does not show any acute or subacute infarction. There chronic small-vessel ischemic changes affecting the pons. There are mild chronic small-vessel changes affecting the cerebral hemispheric white matter. No cortical or large vessel territory infarction. No mass lesion, hemorrhage, hydrocephalus or extra-axial collection. No pituitary mass. No inflammatory sinus disease. No skull or skullbase lesion.  MRA HEAD FINDINGS  Both internal carotid arteries are patent into the brain. There is mild narrowing and irregularity in the siphon regions, right more than left. The anterior and middle cerebral vessels are patent without proximal stenosis, aneurysm or vascular malformation.  The right vertebral artery is the dominant vessel and is  widely patent to the basilar. The left vertebral artery is non dominant and shows diminished flow at the foramen magnum level. Distal vertebral artery shows a normal flow appearance. Particularly given that there is calcification in the major vessels the base of the brain on head CT, I think this is probably a disease vessel segment. The basilar artery is widely patent. Superior cerebellar and posterior cerebral arteries appear normal.  IMPRESSION: No acute or subacute infarction. Chronic small-vessel ischemic changes of the pons in the cerebral hemispheric white matter.  Atherosclerotic disease in the carotid siphon regions without flow limiting stenosis. Abnormal appearance of the distal left vertebral artery with diminished flow likely secondary to atherosclerotic disease. No major intracranial branch vessel occlusion.   Electronically Signed   By: Nelson Chimes M.D.   On: 11/24/2014 10:15     Assessment/Plan: 59 y/o female with ESRD on HD with L sided numbness and L Leg weakness.  The weakness is a giveaway weakness. On sensation pt does split midline.  Not convinced true symptoms - con't antiplatelet therapy - pt is R vertebral dominant with good flow in the basilalr - encouraged pt's symptoms will improved.  - PT/OT  Leotis Pain   11/24/2014, 3:43 PM

## 2014-11-24 NOTE — Care Management Note (Signed)
Patient is active at [facility] on MWF schedule.

## 2014-11-24 NOTE — Care Management (Addendum)
Has ruled out for cva.  Chronic HD.  Dialysis coordinator notified of admission.  PT has recommended that patient may benefit from home health physical therapy.  Patient will consider

## 2014-11-24 NOTE — Progress Notes (Signed)
[facility] [place] NAME: Theresa [name]    MR#:  [id number]  DATE OF BIRTH:  November 27, 1955  SUBJECTIVE:  CHIEF COMPLAINT:   Chief Complaint  Patient presents with  . Chest Pain  . Numbness    REVIEW OF SYSTEMS:  CONSTITUTIONAL: No fever, fatigue or weakness.  EYES: No blurred or double vision.  EARS, NOSE, AND THROAT: No tinnitus or ear pain.  RESPIRATORY: No cough, shortness of breath, wheezing or hemoptysis.  CARDIOVASCULAR: Positive chest pain, no orthopnea or edema.  GASTROINTESTINAL: No nausea, vomiting, diarrhea or abdominal pain.  GENITOURINARY: No dysuria, hematuria.  ENDOCRINE: No polyuria, nocturia,  HEMATOLOGY: No anemia, easy bruising or bleeding SKIN: No rash or lesion. MUSCULOSKELETAL: No joint pain or arthritis.  NEUROLOGIC: Positive tingling, numbness, weakness of left arm and leg. No seizure, loss of consciousness or incontinence. No slurred speech or dysphagia. PSYCHIATRY: No anxiety or depression.   ROS  DRUG ALLERGIES:   Allergies  Allergen Reactions  . Metformin Diarrhea  . Hydrocodone Hives    VITALS:  Blood pressure 160/66, pulse 74, temperature 98 F (36.7 C), temperature source Oral, resp. rate 19, height 5\' 7"  (1.702 m), weight 75.4 kg (166 lb 3.6 oz), SpO2 100 %.  PHYSICAL EXAMINATION:   GENERAL: 59 y.o.-year-old patient lying in the bed with no acute distress.  EYES: Pupils equal, round, reactive to light and accommodation. No scleral icterus. Extraocular muscles intact.  HEENT: Head atraumatic, normocephalic. Oropharynx and nasopharynx clear.  NECK: Supple, no jugular venous distention. No thyroid enlargement, no tenderness.  LUNGS: Normal breath sounds bilaterally, no wheezing, rales,rhonchi or crepitation. No use of accessory muscles of respiration.  CARDIOVASCULAR: S1, S2 normal. No murmurs, rubs, or gallops.  ABDOMEN: Soft, nontender, nondistended. Bowel sounds present.  No organomegaly or mass.  EXTREMITIES: No pedal edema, cyanosis, or clubbing.  NEUROLOGIC: Cranial nerves II through XII are intact. Muscle strength 4/5 in left leg and 5 out of 5 in right leg and both arms. Sensation intact. Gait not checked.  PSYCHIATRIC: The patient is alert and oriented x 3.  SKIN: No obvious rash, lesion, or ulcer.   Physical Exam LABORATORY PANEL:   CBC  Recent Labs Lab 11/23/14 1921  WBC 9.2  HGB 11.1*  HCT 32.7*  PLT 152   ------------------------------------------------------------------------------------------------------------------  Chemistries   Recent Labs Lab 11/23/14 1921  NA 138  K 4.6  CL 94*  CO2 33*  GLUCOSE 141*  BUN 36*  CREATININE 7.90*  CALCIUM 9.0   ------------------------------------------------------------------------------------------------------------------  Cardiac Enzymes  Recent Labs Lab 11/23/14 1921  TROPONINI <0.03   ------------------------------------------------------------------------------------------------------------------  RADIOLOGY:  Ct Head Wo Contrast  11/23/2014   CLINICAL DATA:  Acute onset of left-sided facial numbness, and chest pain radiating to the left arm and neck. Initial encounter.  EXAM: CT HEAD WITHOUT CONTRAST  TECHNIQUE: Contiguous axial images were obtained from the base of the skull through the vertex without intravenous contrast.  COMPARISON:  CT of the head performed 09/25/2012  FINDINGS: There is no evidence of acute infarction, mass lesion, or intra- or extra-axial hemorrhage on CT.  Mild periventricular white matter change likely reflects small vessel ischemic microangiopathy. Calcification is noted at the basal ganglia bilaterally. Mild cerebellar atrophy is noted.  The brainstem and fourth ventricle are within normal limits. The third and lateral ventricles, and basal ganglia are unremarkable in appearance. The cerebral hemispheres are symmetric in appearance, with normal  gray-white differentiation. No mass effect or midline shift  is seen.  There is no evidence of fracture; visualized osseous structures are unremarkable in appearance. The visualized portions of the orbits are within normal limits. The paranasal sinuses and mastoid air cells are well-aerated. No significant soft tissue abnormalities are seen.  IMPRESSION: 1. No acute intracranial pathology seen on CT. 2. Mild small vessel ischemic microangiopathy.   Electronically Signed   By: Garald Balding M.D.   On: 11/23/2014 20:33   Mr Brain Wo Contrast  11/24/2014   CLINICAL DATA:  Left sided numbness and weakness, 1 day duration.  EXAM: MRI HEAD WITHOUT CONTRAST  MRA HEAD WITHOUT CONTRAST  TECHNIQUE: Multiplanar, multiecho pulse sequences of the brain and surrounding structures were obtained without intravenous contrast. Angiographic images of the head were obtained using MRA technique without contrast.  COMPARISON:  Head CT 11/23/2014  FINDINGS: MRI HEAD FINDINGS  Diffusion imaging does not show any acute or subacute infarction. There chronic small-vessel ischemic changes affecting the pons. There are mild chronic small-vessel changes affecting the cerebral hemispheric white matter. No cortical or large vessel territory infarction. No mass lesion, hemorrhage, hydrocephalus or extra-axial collection. No pituitary mass. No inflammatory sinus disease. No skull or skullbase lesion.  MRA HEAD FINDINGS  Both internal carotid arteries are patent into the brain. There is mild narrowing and irregularity in the siphon regions, right more than left. The anterior and middle cerebral vessels are patent without proximal stenosis, aneurysm or vascular malformation.  The right vertebral artery is the dominant vessel and is widely patent to the basilar. The left vertebral artery is non dominant and shows diminished flow at the foramen magnum level. Distal vertebral artery shows a normal flow appearance. Particularly given that there is  calcification in the major vessels the base of the brain on head CT, I think this is probably a disease vessel segment. The basilar artery is widely patent. Superior cerebellar and posterior cerebral arteries appear normal.  IMPRESSION: No acute or subacute infarction. Chronic small-vessel ischemic changes of the pons in the cerebral hemispheric white matter.  Atherosclerotic disease in the carotid siphon regions without flow limiting stenosis. Abnormal appearance of the distal left vertebral artery with diminished flow likely secondary to atherosclerotic disease. No major intracranial branch vessel occlusion.   Electronically Signed   By: Nelson Chimes M.D.   On: 11/24/2014 10:15   US Carotid Bilateral  11/24/2014   CLINICAL DATA:  Left-sided weakness.  EXAM: BILATERAL CAROTID DUPLEX ULTRASOUND  TECHNIQUE: Pearline Cables scale imaging, color Doppler and duplex ultrasound were performed of bilateral carotid and vertebral arteries in the neck.  COMPARISON:  None.  FINDINGS: Criteria: Quantification of carotid stenosis is based on velocity parameters that correlate the residual internal carotid diameter with NASCET-based stenosis levels, using the diameter of the distal internal carotid lumen as the denominator for stenosis measurement.  The following velocity measurements were obtained:  RIGHT  ICA:  176/33 cm/sec  CCA:  [id number] cm/sec  SYSTOLIC ICA/CCA RATIO:  1.6  DIASTOLIC ICA/CCA RATIO:  2.4  ECA:  263 cm/sec  LEFT  ICA:  206/30 cm/sec  CCA:  [id number] cm/sec  SYSTOLIC ICA/CCA RATIO:  2.1  DIASTOLIC ICA/CCA RATIO:  1.5  ECA:  297 cm/sec  RIGHT CAROTID ARTERY: Mild plaque right carotid bulb. No flow limiting stenosis. Waveforms are unremarkable.  RIGHT VERTEBRAL ARTERY:  Patent antegrade flow .  LEFT CAROTID ARTERY: Mild plaque left carotid bulb. No flow limiting stenosis. Waveforms are unremarkable.  LEFT VERTEBRAL ARTERY:  Patent antegrade flow.  IMPRESSION: 1. Mild  bilateral carotid bifurcation plaque. No flow limiting  stenosis. Degree of stenosis less than 50%. 2. Vertebral arteries are patent with antegrade flow.   Electronically Signed   By: Marcello Moores  Register   On: 11/24/2014 11:32   Dg Chest Port 1 View  11/23/2014   CLINICAL DATA:  Chest pain  EXAM: PORTABLE CHEST - 1 VIEW  COMPARISON:  05/12/2014  FINDINGS: The heart size and mediastinal contours are within normal limits. Both lungs are clear. The visualized skeletal structures are unremarkable.  IMPRESSION: No active disease.   Electronically Signed   By: Inez Catalina M.D.   On: 11/23/2014 19:51   Mr Jodene Nam Head/brain Wo Cm  11/24/2014   CLINICAL DATA:  Left sided numbness and weakness, 1 day duration.  EXAM: MRI HEAD WITHOUT CONTRAST  MRA HEAD WITHOUT CONTRAST  TECHNIQUE: Multiplanar, multiecho pulse sequences of the brain and surrounding structures were obtained without intravenous contrast. Angiographic images of the head were obtained using MRA technique without contrast.  COMPARISON:  Head CT 11/23/2014  FINDINGS: MRI HEAD FINDINGS  Diffusion imaging does not show any acute or subacute infarction. There chronic small-vessel ischemic changes affecting the pons. There are mild chronic small-vessel changes affecting the cerebral hemispheric white matter. No cortical or large vessel territory infarction. No mass lesion, hemorrhage, hydrocephalus or extra-axial collection. No pituitary mass. No inflammatory sinus disease. No skull or skullbase lesion.  MRA HEAD FINDINGS  Both internal carotid arteries are patent into the brain. There is mild narrowing and irregularity in the siphon regions, right more than left. The anterior and middle cerebral vessels are patent without proximal stenosis, aneurysm or vascular malformation.  The right vertebral artery is the dominant vessel and is widely patent to the basilar. The left vertebral artery is non dominant and shows diminished flow at the foramen magnum level. Distal vertebral artery shows a normal flow appearance.  Particularly given that there is calcification in the major vessels the base of the brain on head CT, I think this is probably a disease vessel segment. The basilar artery is widely patent. Superior cerebellar and posterior cerebral arteries appear normal.  IMPRESSION: No acute or subacute infarction. Chronic small-vessel ischemic changes of the pons in the cerebral hemispheric white matter.  Atherosclerotic disease in the carotid siphon regions without flow limiting stenosis. Abnormal appearance of the distal left vertebral artery with diminished flow likely secondary to atherosclerotic disease. No major intracranial branch vessel occlusion.   Electronically Signed   By: Nelson Chimes M.D.   On: 11/24/2014 10:15    ASSESSMENT AND PLAN:   * suspected CVA   MRI, Echo, Carotid doppler   MRI negative- Will call Neurology consult to explain her symptoms.   ON statin, ASA, PT , swallow eval.  * malignant Htn   Permissive hypertension.   Continue home meds.  * ESRD on HD    Continue- nephro consult appreciated.  * DM   Cont levemir and Insulin sliding scale coverage.  * Hyperlipidemia   Rosuvastatin  * hx of CAD   Coreg, ASA, Statin.  All the records are reviewed and case discussed with Care Management/Social Workerr. Management plans discussed with the patient, family and they are in agreement.  CODE STATUS: full  TOTAL TIME TAKING CARE OF THIS PATIENT: 35 minutes.   More than 50% of the visit was spent in counseling/coordination of care  POSSIBLE D/C IN 1-2 DAYS, DEPENDING ON CLINICAL CONDITION.   Vaughan Basta M.D on 11/24/2014   Between 7am to  6pm - Pager - [phone]  After 6pm go to [email or web address] - password [email or web address] [facility]  Office  [phone]  CC: Primary care physician; Rica Mast, MD

## 2014-11-24 NOTE — Evaluation (Signed)
Physical Therapy Evaluation Patient Details Name: LYNCOLN HOUSLER MRN: [id number] DOB: [birth year] Today's Date: 11/24/2014   History of Present Illness  This patient is a 59 year old female who came to [facility] for L sided weakness. MRI negative for a CVA  Clinical Impression  Pt shows functional mobility and strength. She reports some increased numbness in the L LE (though has neuropathy at baseline).  She is safe, but reports being slower than normal, with ambulation and ultimately she should be able to go home w/o issue.      Follow Up Recommendations Home health PT    Equipment Recommendations  None recommended by PT    Recommendations for Other Services       Precautions / Restrictions Precautions Precautions: Fall Restrictions Weight Bearing Restrictions: No      Mobility  Bed Mobility Overal bed mobility: Independent                Transfers Overall transfer level: Independent (observation) Equipment used:  (Gate belt (observed with physical therapy))                Ambulation/Gait Ambulation/Gait assistance: Min guard Ambulation Distance (Feet): 200 Feet Assistive device: None       General Gait Details: Pt with wide BOS and L LE appears stiffer and with minimal quality of motion issues  Stairs            Wheelchair Mobility    Modified Rankin (Stroke Patients Only)       Balance Overall balance assessment: Modified Independent (reports not feeling as stable as normal, though no LOBs)                                           Pertinent Vitals/Pain      Home Living Family/patient expects to be discharged to:: Private residence Living Arrangements: Alone Available Help at Discharge: Family;Friend(s) Type of Home: House Home Access:  (No stairs to enter)   Entrance Stairs-Number of Steps: 0 Home Layout: One level Home Equipment: None      Prior Function Level of Independence: Independent          Comments: Pt reports she is out of the house multiple times a week and has no issues with being out of the house regularly     Hand Dominance   Dominant Hand: Right    Extremity/Trunk Assessment   Upper Extremity Assessment:  (R UE 5/5 through out grip 60 lbs - L UE 4/5 grip 45 lbs  (Poor endurance))           Lower Extremity Assessment:  (L LE grossly 4-/5, R WFL)         Communication   Communication: No difficulties  Cognition Arousal/Alertness: Awake/alert Behavior During Therapy: WFL for tasks assessed/performed Overall Cognitive Status: Within Functional Limits for tasks assessed                      General Comments      Exercises        Assessment/Plan    PT Assessment Patient needs continued PT services  PT Diagnosis Abnormality of gait;Generalized weakness   PT Problem List Decreased strength;Decreased balance;Decreased coordination;Decreased safety awareness  PT Treatment Interventions Gait training;Therapeutic activities;Balance training;Therapeutic exercise;Neuromuscular re-education   PT Goals (Current goals can be found in the Care Plan section) Acute Rehab PT Goals  Patient Stated Goal: Go home PT Goal Formulation: With patient Time For Goal Achievement: 12/08/14 Potential to Achieve Goals: Good    Frequency Min 2X/week   Barriers to discharge        Co-evaluation               End of Session   Activity Tolerance: Patient tolerated treatment well Patient left: in chair (OT present)           Time: [id number] PT Time Calculation (min) (ACUTE ONLY): 17 min   Charges:   PT Evaluation $Initial PT Evaluation Tier I: 1 Procedure     PT G Codes:       Wayne Both, PT, DPT [phone]  Kreg Shropshire 11/24/2014, 4:25 PM

## 2014-11-24 NOTE — Progress Notes (Signed)
Speech Therapy Note: received order, reviewed chart notes; consulted NSG and pt this PM. Pt was verbally conversive and denied any speech-language deficits. Pt also denied any trouble swallowing having eaten her dinner meal w/out c/o difficulty swallowing or s/s of aspiration as discussed. NSG denied pt having any trouble swallowing pills today.  Due to pt stating she is at her baseline, no skilled ST services indicated at this time. NSG to reconsult if any decline in status. Pt agreed.

## 2014-11-25 LAB — GLUCOSE, CAPILLARY
Glucose-Capillary: 118 mg/dL — ABNORMAL HIGH (ref 65–99)
Glucose-Capillary: 276 mg/dL — ABNORMAL HIGH (ref 65–99)

## 2014-11-25 LAB — HEPATITIS B SURFACE ANTIGEN: Hepatitis B Surface Ag: NEGATIVE

## 2014-11-25 MED ORDER — AMLODIPINE BESYLATE 10 MG PO TABS
10.0000 mg | ORAL_TABLET | Freq: Every day | ORAL | Status: DC
  Administered 2014-11-25: 10 mg via ORAL
  Filled 2014-11-25: qty 1

## 2014-11-25 MED ORDER — CLONIDINE HCL 0.1 MG PO TABS: 0.1000 mg | ORAL_TABLET | Freq: Three times a day (TID) | ORAL | Status: DC

## 2014-11-25 NOTE — Progress Notes (Signed)
Subjective:  patint known to our practice from Outpatient dialysis Admitted for high BP and weakness & stroke like symptoms Patient is doing well overall Still feels slight weakness in left arm Ate a good breakfast BP is high again today   Objective:  Vital signs in last 24 hours:  Temp:  [97.8 F (36.6 C)-98.7 F (37.1 C)] 98.7 F (37.1 C) (06/18 0729) Pulse Rate:  [69-75] 73 (06/18 0729) Resp:  [13-20] 18 (06/18 0729) BP: (111-195)/(41-90) 194/62 mmHg (06/18 0729) SpO2:  [99 %-100 %] 100 % (06/18 0729) Weight:  [75.4 kg (166 lb 3.6 oz)-76.295 kg (168 lb 3.2 oz)] 76.295 kg (168 lb 3.2 oz) (06/18 0511)  Weight change: -1.819 kg (-4 lb 0.2 oz) Filed Weights   11/24/14 1120 11/24/14 1526 11/25/14 0511  Weight: 76.2 kg (167 lb 15.9 oz) 75.4 kg (166 lb 3.6 oz) 76.295 kg (168 lb 3.2 oz)    Intake/Output: I/O last 3 completed shifts: In: 480 [P.O.:480] Out: 1050 [Urine:50; Other:1000]     Physical Exam: General: NAD, laying in bed  HEENT Anicteric, moist mucus membranes  Neck supple  Pulm/lungs Clear, normal effort  CVS/Heart Regular, no rub  Abdomen:  Soft, non tender  Extremities: No peripheral edema  Neurologic: Alert, able to communicate, left grip weakness as compared to right  Skin: No acute rashes  Access: Left arm AVG       Basic Metabolic Panel:  Recent Labs Lab 11/23/14 1921  NA 138  K 4.6  CL 94*  CO2 33*  GLUCOSE 141*  BUN 36*  CREATININE 7.90*  CALCIUM 9.0     CBC:  Recent Labs Lab 11/23/14 1921  WBC 9.2  HGB 11.1*  HCT 32.7*  MCV 103.5*  PLT 152      Microbiology: Results for orders placed or performed in visit on 02/13/14  Culture, blood (single)     Status: None   Collection Time: 02/13/14  3:00 AM  Result Value Ref Range Status   Micro Text Report   Final       COMMENT                   NO GROWTH AEROBICALLY/ANAEROBICALLY IN 5 DAYS   ANTIBIOTIC                                                      Culture, blood  (single)     Status: None   Collection Time: 02/13/14  3:15 AM  Result Value Ref Range Status   Micro Text Report   Final       COMMENT                   NO GROWTH AEROBICALLY/ANAEROBICALLY IN 5 DAYS   ANTIBIOTIC                                                        Coagulation Studies: No results for input(s): LABPROT, INR in the last 72 hours.  Urinalysis: No results for input(s): COLORURINE, LABSPEC, PHURINE, GLUCOSEU, HGBUR, BILIRUBINUR, KETONESUR, PROTEINUR, UROBILINOGEN, NITRITE, LEUKOCYTESUR in the last 72 hours.  Invalid input(s): APPERANCEUR    Imaging: Ct  Head Wo Contrast  11/23/2014   CLINICAL DATA:  Acute onset of left-sided facial numbness, and chest pain radiating to the left arm and neck. Initial encounter.  EXAM: CT HEAD WITHOUT CONTRAST  TECHNIQUE: Contiguous axial images were obtained from the base of the skull through the vertex without intravenous contrast.  COMPARISON:  CT of the head performed 09/25/2012  FINDINGS: There is no evidence of acute infarction, mass lesion, or intra- or extra-axial hemorrhage on CT.  Mild periventricular white matter change likely reflects small vessel ischemic microangiopathy. Calcification is noted at the basal ganglia bilaterally. Mild cerebellar atrophy is noted.  The brainstem and fourth ventricle are within normal limits. The third and lateral ventricles, and basal ganglia are unremarkable in appearance. The cerebral hemispheres are symmetric in appearance, with normal gray-white differentiation. No mass effect or midline shift is seen.  There is no evidence of fracture; visualized osseous structures are unremarkable in appearance. The visualized portions of the orbits are within normal limits. The paranasal sinuses and mastoid air cells are well-aerated. No significant soft tissue abnormalities are seen.  IMPRESSION: 1. No acute intracranial pathology seen on CT. 2. Mild small vessel ischemic microangiopathy.   Electronically Signed    By: Garald Balding M.D.   On: 11/23/2014 20:33   Mr Brain Wo Contrast  11/24/2014   CLINICAL DATA:  Left sided numbness and weakness, 1 day duration.  EXAM: MRI HEAD WITHOUT CONTRAST  MRA HEAD WITHOUT CONTRAST  TECHNIQUE: Multiplanar, multiecho pulse sequences of the brain and surrounding structures were obtained without intravenous contrast. Angiographic images of the head were obtained using MRA technique without contrast.  COMPARISON:  Head CT 11/23/2014  FINDINGS: MRI HEAD FINDINGS  Diffusion imaging does not show any acute or subacute infarction. There chronic small-vessel ischemic changes affecting the pons. There are mild chronic small-vessel changes affecting the cerebral hemispheric white matter. No cortical or large vessel territory infarction. No mass lesion, hemorrhage, hydrocephalus or extra-axial collection. No pituitary mass. No inflammatory sinus disease. No skull or skullbase lesion.  MRA HEAD FINDINGS  Both internal carotid arteries are patent into the brain. There is mild narrowing and irregularity in the siphon regions, right more than left. The anterior and middle cerebral vessels are patent without proximal stenosis, aneurysm or vascular malformation.  The right vertebral artery is the dominant vessel and is widely patent to the basilar. The left vertebral artery is non dominant and shows diminished flow at the foramen magnum level. Distal vertebral artery shows a normal flow appearance. Particularly given that there is calcification in the major vessels the base of the brain on head CT, I think this is probably a disease vessel segment. The basilar artery is widely patent. Superior cerebellar and posterior cerebral arteries appear normal.  IMPRESSION: No acute or subacute infarction. Chronic small-vessel ischemic changes of the pons in the cerebral hemispheric white matter.  Atherosclerotic disease in the carotid siphon regions without flow limiting stenosis. Abnormal appearance of the  distal left vertebral artery with diminished flow likely secondary to atherosclerotic disease. No major intracranial branch vessel occlusion.   Electronically Signed   By: Nelson Chimes M.D.   On: 11/24/2014 10:15   US Carotid Bilateral  11/24/2014   CLINICAL DATA:  Left-sided weakness.  EXAM: BILATERAL CAROTID DUPLEX ULTRASOUND  TECHNIQUE: Pearline Cables scale imaging, color Doppler and duplex ultrasound were performed of bilateral carotid and vertebral arteries in the neck.  COMPARISON:  None.  FINDINGS: Criteria: Quantification of carotid stenosis is based on velocity  parameters that correlate the residual internal carotid diameter with NASCET-based stenosis levels, using the diameter of the distal internal carotid lumen as the denominator for stenosis measurement.  The following velocity measurements were obtained:  RIGHT  ICA:  176/33 cm/sec  CCA:  [id number] cm/sec  SYSTOLIC ICA/CCA RATIO:  1.6  DIASTOLIC ICA/CCA RATIO:  2.4  ECA:  263 cm/sec  LEFT  ICA:  206/30 cm/sec  CCA:  [id number] cm/sec  SYSTOLIC ICA/CCA RATIO:  2.1  DIASTOLIC ICA/CCA RATIO:  1.5  ECA:  297 cm/sec  RIGHT CAROTID ARTERY: Mild plaque right carotid bulb. No flow limiting stenosis. Waveforms are unremarkable.  RIGHT VERTEBRAL ARTERY:  Patent antegrade flow .  LEFT CAROTID ARTERY: Mild plaque left carotid bulb. No flow limiting stenosis. Waveforms are unremarkable.  LEFT VERTEBRAL ARTERY:  Patent antegrade flow.  IMPRESSION: 1. Mild bilateral carotid bifurcation plaque. No flow limiting stenosis. Degree of stenosis less than 50%. 2. Vertebral arteries are patent with antegrade flow.   Electronically Signed   By: Marcello Moores  Register   On: 11/24/2014 11:32   Dg Chest Port 1 View  11/23/2014   CLINICAL DATA:  Chest pain  EXAM: PORTABLE CHEST - 1 VIEW  COMPARISON:  05/12/2014  FINDINGS: The heart size and mediastinal contours are within normal limits. Both lungs are clear. The visualized skeletal structures are unremarkable.  IMPRESSION: No active disease.    Electronically Signed   By: Inez Catalina M.D.   On: 11/23/2014 19:51   Mr Jodene Nam Head/brain Wo Cm  11/24/2014   CLINICAL DATA:  Left sided numbness and weakness, 1 day duration.  EXAM: MRI HEAD WITHOUT CONTRAST  MRA HEAD WITHOUT CONTRAST  TECHNIQUE: Multiplanar, multiecho pulse sequences of the brain and surrounding structures were obtained without intravenous contrast. Angiographic images of the head were obtained using MRA technique without contrast.  COMPARISON:  Head CT 11/23/2014  FINDINGS: MRI HEAD FINDINGS  Diffusion imaging does not show any acute or subacute infarction. There chronic small-vessel ischemic changes affecting the pons. There are mild chronic small-vessel changes affecting the cerebral hemispheric white matter. No cortical or large vessel territory infarction. No mass lesion, hemorrhage, hydrocephalus or extra-axial collection. No pituitary mass. No inflammatory sinus disease. No skull or skullbase lesion.  MRA HEAD FINDINGS  Both internal carotid arteries are patent into the brain. There is mild narrowing and irregularity in the siphon regions, right more than left. The anterior and middle cerebral vessels are patent without proximal stenosis, aneurysm or vascular malformation.  The right vertebral artery is the dominant vessel and is widely patent to the basilar. The left vertebral artery is non dominant and shows diminished flow at the foramen magnum level. Distal vertebral artery shows a normal flow appearance. Particularly given that there is calcification in the major vessels the base of the brain on head CT, I think this is probably a disease vessel segment. The basilar artery is widely patent. Superior cerebellar and posterior cerebral arteries appear normal.  IMPRESSION: No acute or subacute infarction. Chronic small-vessel ischemic changes of the pons in the cerebral hemispheric white matter.  Atherosclerotic disease in the carotid siphon regions without flow limiting stenosis.  Abnormal appearance of the distal left vertebral artery with diminished flow likely secondary to atherosclerotic disease. No major intracranial branch vessel occlusion.   Electronically Signed   By: Nelson Chimes M.D.   On: 11/24/2014 10:15     Medications:     . aspirin  300 mg Rectal Daily   Or  .  aspirin  325 mg Oral Daily  . calcium acetate  [id number] mg Oral 5 X Daily  . carvedilol  12.5 mg Oral BID  . cloNIDine  0.1 mg Oral TID  . fluticasone  1 spray Each Nare Daily  . heparin  5,000 Units Subcutaneous 3 times per day  . insulin aspart  0-5 Units Subcutaneous QHS  . insulin aspart  0-9 Units Subcutaneous TID WC  . insulin detemir  5 Units Subcutaneous Daily  . multivitamin  1 tablet Oral QHS  . nitroGLYCERIN  0.5 inch Topical 4 times per day  . pregabalin  75 mg Oral Daily  . rosuvastatin  40 mg Oral Daily   acetaminophen **OR** acetaminophen, albuterol, hydrALAZINE, nitroGLYCERIN, oxyCODONE-acetaminophen, senna-docusate  Assessment/ Plan:  59 y.o. 62 american female  With diabetes type 2 with severe complications including diabetic retinopathy, neuropathy,nephropathy, long standing hypertension, COPD, hyperlipidemia, congestive heart failure with EF 45-50%, mitral regurgitation, tricuspid regurgitation, nephrotic proteinuria, Anemia, CAD and ESRD on hemodialysis.   1. ESRD 2. AOCKD 3. SHPTH 4. Left sided weakness 5. HTN  Dialysis MWF Increase  Frequency of clonidine to TID If BP sill high, add Hydralazine Patient is allergic to Lisinopril  Would not challenge with either ACE-i or ARB    LOS: 2 Daleon Willinger 6/18/20169:38 AM

## 2014-11-25 NOTE — Progress Notes (Signed)
Subjective:  patint known to our practice from Outpatient dialysis Admitted for high BP and weakness & stroke like symptoms Patient seen during dialysis Tolerating well    Objective:  Vital signs in last 24 hours:  Temp:  [97.8 F (36.6 C)-98.7 F (37.1 C)] 98.7 F (37.1 C) (06/18 0729) Pulse Rate:  [69-75] 73 (06/18 0729) Resp:  [13-20] 18 (06/18 0729) BP: (111-195)/(41-90) 194/62 mmHg (06/18 0729) SpO2:  [99 %-100 %] 100 % (06/18 0729) Weight:  [75.4 kg (166 lb 3.6 oz)-76.295 kg (168 lb 3.2 oz)] 76.295 kg (168 lb 3.2 oz) (06/18 0511)  Weight change: -1.819 kg (-4 lb 0.2 oz) Filed Weights   11/24/14 1120 11/24/14 1526 11/25/14 0511  Weight: 76.2 kg (167 lb 15.9 oz) 75.4 kg (166 lb 3.6 oz) 76.295 kg (168 lb 3.2 oz)    Intake/Output: I/O last 3 completed shifts: In: 480 [P.O.:480] Out: 1050 [Urine:50; Other:1000]     Physical Exam: General: NAD, laying in bed  HEENT Anicteric, moist mucus membranes  Neck supple  Pulm/lungs Clear, normal effort  CVS/Heart Regular, no rub  Abdomen:  Soft, non tender  Extremities: No peripheral edema  Neurologic: Alert, able to communicate  Skin: No acute rashes  Access: Left arm AVG       Basic Metabolic Panel:  Recent Labs Lab 11/23/14 1921  NA 138  K 4.6  CL 94*  CO2 33*  GLUCOSE 141*  BUN 36*  CREATININE 7.90*  CALCIUM 9.0     CBC:  Recent Labs Lab 11/23/14 1921  WBC 9.2  HGB 11.1*  HCT 32.7*  MCV 103.5*  PLT 152      Microbiology: Results for orders placed or performed in visit on 02/13/14  Culture, blood (single)     Status: None   Collection Time: 02/13/14  3:00 AM  Result Value Ref Range Status   Micro Text Report   Final       COMMENT                   NO GROWTH AEROBICALLY/ANAEROBICALLY IN 5 DAYS   ANTIBIOTIC                                                      Culture, blood (single)     Status: None   Collection Time: 02/13/14  3:15 AM  Result Value Ref Range Status   Micro Text  Report   Final       COMMENT                   NO GROWTH AEROBICALLY/ANAEROBICALLY IN 5 DAYS   ANTIBIOTIC                                                        Coagulation Studies: No results for input(s): LABPROT, INR in the last 72 hours.  Urinalysis: No results for input(s): COLORURINE, LABSPEC, PHURINE, GLUCOSEU, HGBUR, BILIRUBINUR, KETONESUR, PROTEINUR, UROBILINOGEN, NITRITE, LEUKOCYTESUR in the last 72 hours.  Invalid input(s): APPERANCEUR    Imaging: Ct Head Wo Contrast  11/23/2014   CLINICAL DATA:  Acute onset of left-sided facial numbness, and chest pain radiating to  the left arm and neck. Initial encounter.  EXAM: CT HEAD WITHOUT CONTRAST  TECHNIQUE: Contiguous axial images were obtained from the base of the skull through the vertex without intravenous contrast.  COMPARISON:  CT of the head performed 09/25/2012  FINDINGS: There is no evidence of acute infarction, mass lesion, or intra- or extra-axial hemorrhage on CT.  Mild periventricular white matter change likely reflects small vessel ischemic microangiopathy. Calcification is noted at the basal ganglia bilaterally. Mild cerebellar atrophy is noted.  The brainstem and fourth ventricle are within normal limits. The third and lateral ventricles, and basal ganglia are unremarkable in appearance. The cerebral hemispheres are symmetric in appearance, with normal gray-white differentiation. No mass effect or midline shift is seen.  There is no evidence of fracture; visualized osseous structures are unremarkable in appearance. The visualized portions of the orbits are within normal limits. The paranasal sinuses and mastoid air cells are well-aerated. No significant soft tissue abnormalities are seen.  IMPRESSION: 1. No acute intracranial pathology seen on CT. 2. Mild small vessel ischemic microangiopathy.   Electronically Signed   By: Garald Balding M.D.   On: 11/23/2014 20:33   Mr Brain Wo Contrast  11/24/2014   CLINICAL DATA:  Left  sided numbness and weakness, 1 day duration.  EXAM: MRI HEAD WITHOUT CONTRAST  MRA HEAD WITHOUT CONTRAST  TECHNIQUE: Multiplanar, multiecho pulse sequences of the brain and surrounding structures were obtained without intravenous contrast. Angiographic images of the head were obtained using MRA technique without contrast.  COMPARISON:  Head CT 11/23/2014  FINDINGS: MRI HEAD FINDINGS  Diffusion imaging does not show any acute or subacute infarction. There chronic small-vessel ischemic changes affecting the pons. There are mild chronic small-vessel changes affecting the cerebral hemispheric white matter. No cortical or large vessel territory infarction. No mass lesion, hemorrhage, hydrocephalus or extra-axial collection. No pituitary mass. No inflammatory sinus disease. No skull or skullbase lesion.  MRA HEAD FINDINGS  Both internal carotid arteries are patent into the brain. There is mild narrowing and irregularity in the siphon regions, right more than left. The anterior and middle cerebral vessels are patent without proximal stenosis, aneurysm or vascular malformation.  The right vertebral artery is the dominant vessel and is widely patent to the basilar. The left vertebral artery is non dominant and shows diminished flow at the foramen magnum level. Distal vertebral artery shows a normal flow appearance. Particularly given that there is calcification in the major vessels the base of the brain on head CT, I think this is probably a disease vessel segment. The basilar artery is widely patent. Superior cerebellar and posterior cerebral arteries appear normal.  IMPRESSION: No acute or subacute infarction. Chronic small-vessel ischemic changes of the pons in the cerebral hemispheric white matter.  Atherosclerotic disease in the carotid siphon regions without flow limiting stenosis. Abnormal appearance of the distal left vertebral artery with diminished flow likely secondary to atherosclerotic disease. No major  intracranial branch vessel occlusion.   Electronically Signed   By: Nelson Chimes M.D.   On: 11/24/2014 10:15   US Carotid Bilateral  11/24/2014   CLINICAL DATA:  Left-sided weakness.  EXAM: BILATERAL CAROTID DUPLEX ULTRASOUND  TECHNIQUE: Pearline Cables scale imaging, color Doppler and duplex ultrasound were performed of bilateral carotid and vertebral arteries in the neck.  COMPARISON:  None.  FINDINGS: Criteria: Quantification of carotid stenosis is based on velocity parameters that correlate the residual internal carotid diameter with NASCET-based stenosis levels, using the diameter of the distal internal carotid lumen  as the denominator for stenosis measurement.  The following velocity measurements were obtained:  RIGHT  ICA:  176/33 cm/sec  CCA:  Q000111Q cm/sec  SYSTOLIC ICA/CCA RATIO:  1.6  DIASTOLIC ICA/CCA RATIO:  2.4  ECA:  263 cm/sec  LEFT  ICA:  206/30 cm/sec  CCA:  [id number] cm/sec  SYSTOLIC ICA/CCA RATIO:  2.1  DIASTOLIC ICA/CCA RATIO:  1.5  ECA:  297 cm/sec  RIGHT CAROTID ARTERY: Mild plaque right carotid bulb. No flow limiting stenosis. Waveforms are unremarkable.  RIGHT VERTEBRAL ARTERY:  Patent antegrade flow .  LEFT CAROTID ARTERY: Mild plaque left carotid bulb. No flow limiting stenosis. Waveforms are unremarkable.  LEFT VERTEBRAL ARTERY:  Patent antegrade flow.  IMPRESSION: 1. Mild bilateral carotid bifurcation plaque. No flow limiting stenosis. Degree of stenosis less than 50%. 2. Vertebral arteries are patent with antegrade flow.   Electronically Signed   By: Marcello Moores  Register   On: 11/24/2014 11:32   Dg Chest Port 1 View  11/23/2014   CLINICAL DATA:  Chest pain  EXAM: PORTABLE CHEST - 1 VIEW  COMPARISON:  05/12/2014  FINDINGS: The heart size and mediastinal contours are within normal limits. Both lungs are clear. The visualized skeletal structures are unremarkable.  IMPRESSION: No active disease.   Electronically Signed   By: Inez Catalina M.D.   On: 11/23/2014 19:51   Mr Jodene Nam Head/brain Wo  Cm  11/24/2014   CLINICAL DATA:  Left sided numbness and weakness, 1 day duration.  EXAM: MRI HEAD WITHOUT CONTRAST  MRA HEAD WITHOUT CONTRAST  TECHNIQUE: Multiplanar, multiecho pulse sequences of the brain and surrounding structures were obtained without intravenous contrast. Angiographic images of the head were obtained using MRA technique without contrast.  COMPARISON:  Head CT 11/23/2014  FINDINGS: MRI HEAD FINDINGS  Diffusion imaging does not show any acute or subacute infarction. There chronic small-vessel ischemic changes affecting the pons. There are mild chronic small-vessel changes affecting the cerebral hemispheric white matter. No cortical or large vessel territory infarction. No mass lesion, hemorrhage, hydrocephalus or extra-axial collection. No pituitary mass. No inflammatory sinus disease. No skull or skullbase lesion.  MRA HEAD FINDINGS  Both internal carotid arteries are patent into the brain. There is mild narrowing and irregularity in the siphon regions, right more than left. The anterior and middle cerebral vessels are patent without proximal stenosis, aneurysm or vascular malformation.  The right vertebral artery is the dominant vessel and is widely patent to the basilar. The left vertebral artery is non dominant and shows diminished flow at the foramen magnum level. Distal vertebral artery shows a normal flow appearance. Particularly given that there is calcification in the major vessels the base of the brain on head CT, I think this is probably a disease vessel segment. The basilar artery is widely patent. Superior cerebellar and posterior cerebral arteries appear normal.  IMPRESSION: No acute or subacute infarction. Chronic small-vessel ischemic changes of the pons in the cerebral hemispheric white matter.  Atherosclerotic disease in the carotid siphon regions without flow limiting stenosis. Abnormal appearance of the distal left vertebral artery with diminished flow likely secondary to  atherosclerotic disease. No major intracranial branch vessel occlusion.   Electronically Signed   By: Nelson Chimes M.D.   On: 11/24/2014 10:15     Medications:     . aspirin  300 mg Rectal Daily   Or  . aspirin  325 mg Oral Daily  . calcium acetate  [id number] mg Oral 5 X Daily  . carvedilol  12.5 mg Oral BID  . cloNIDine  0.1 mg Oral TID  . fluticasone  1 spray Each Nare Daily  . heparin  5,000 Units Subcutaneous 3 times per day  . insulin aspart  0-5 Units Subcutaneous QHS  . insulin aspart  0-9 Units Subcutaneous TID WC  . insulin detemir  5 Units Subcutaneous Daily  . multivitamin  1 tablet Oral QHS  . nitroGLYCERIN  0.5 inch Topical 4 times per day  . pregabalin  75 mg Oral Daily  . rosuvastatin  40 mg Oral Daily   acetaminophen **OR** acetaminophen, albuterol, hydrALAZINE, nitroGLYCERIN, oxyCODONE-acetaminophen, senna-docusate  Assessment/ Plan:  59 y.o. 59 american female  With diabetes type 2 with severe complications including diabetic retinopathy, neuropathy,nephropathy, long standing hypertension, COPD, hyperlipidemia, congestive heart failure with EF 45-50%, mitral regurgitation, tricuspid regurgitation, nephrotic proteinuria, Anemia, CAD and ESRD on hemodialysis.   1. ESRD 2. AOCKD 3. SHPTH 4. Left sided weakness 5. HTN  Patient seen during dialysis Tolerating well  BFR 400 DFR 800      LOS: 2 Theresa [name] 6/18/20169:33 AM

## 2014-11-25 NOTE — Care Management Note (Signed)
Case Management Note  Patient Details  Name: KAMAILE BISSEY MRN: [id number] Date of Birth: [birth year]  Subjective/Objective:      Referral faxed and called to [facility] for PT after Ms Carlis Abbott chose [facility] from a list of providers. Explained to Mrs Beals that it might be Monday before she is contacted to set up an appointment with [facility]. Discussed possible DME needs with Dr Anselm Jungling who reports that Mrs Purviance has no DME needs.                  Expected Discharge Date:                  Expected Discharge Plan:     In-House Referral:     Discharge planning Services     Post Acute Care Choice:    Choice offered to:     DME Arranged:    DME Agency:     HH Arranged:    [place] Agency:     Status of Service:     Medicare Important Message Given:  Yes Date Medicare IM Given:  11/24/14 Medicare IM give by:  Joni Reining Date Additional Medicare IM Given:    Additional Medicare Important Message give by:     If discussed at [place] of Stay Meetings, dates discussed:    Additional Comments:  Maicol Bowland A, RN 11/25/2014, 11:27 AM

## 2014-11-25 NOTE — Plan of Care (Signed)
Problem: Phase I Progression Outcomes Goal: Anginal pain relieved Outcome: Not Progressing Still c/o chest pain

## 2014-11-25 NOTE — Discharge Summary (Signed)
[facility] NAME: Theresa [name]    MR#:  [id number]  DATE OF BIRTH:  1956-02-12  DATE OF ADMISSION:  11/23/2014 ADMITTING PHYSICIAN: Henreitta Leber, MD  DATE OF DISCHARGE: 11/25/2014  PRIMARY CARE PHYSICIAN: Rica Mast, MD    ADMISSION DIAGNOSIS:  Other chest pain [R07.89] Left-sided weakness [M62.89] Uncontrolled hypertension [I10] Acute CVA (cerebrovascular accident) [I63.9]  DISCHARGE DIAGNOSIS:  Principal Problem:   Acute CVA (cerebrovascular accident)- ruled OUT- likely TIA episode. Active Problems:   HTN (hypertension), malignant   SECONDARY DIAGNOSIS:   Past Medical History  Diagnosis Date  . Hypertension   . ESRD on hemodialysis     a. [facility] in [place], [place], on a TTS schedule.  She started dialysis in Feb 2014.  Etiology of renal failure not known, likely diabetes.  Has a left upper arm AV graft.  Marland Kitchen COPD (chronic obstructive pulmonary disease)   . Emphysema   . History of pneumonia     June 2012  . History of bronchitis     Mar 2012  . Peripheral vascular disease   . Chronic constipation   . Colon polyps   . Sickle cell trait   . Anemia of chronic disease   . Diabetes mellitus   . Moderate mitral insufficiency     a. 10/2013 Echo: EF 55-60%, mod MR.  . Diabetic neuropathy   . Diabetic retinopathy 05/28/2013    Hx bilat retinal detachment, proliferative diab retinopathy and bilat vitreous hemorrhage   . Hyperlipidemia   . Coronary artery disease     a. 05/2013 NSTEMI/PCI: LM 20d, LAD min irregs, LCX small, nl, OM1 nl, RCA dom 4m(2.5x16 Promus DES), PDA1 80p.  . Chronic diastolic CHF (congestive heart failure)     a. 10/2013 Echo ([facility]: EF 55-60%, mod conc LVH, mod MR, mildly dil LA, mild Ao sclerosis w/o stenosis.  . Carotid arterial disease     a. 02/2013 U/S: 40-59% bilat ICA stenosis - *f/u 02/2014*  . History of tobacco abuse     a. Quit 2012.    HOSPITAL COURSE:  *  suspected CVA  MRI, Echo, Carotid doppler  MRI negative- Will call Neurology consult to explain her symptoms.  ON statin, ASA, PT , swallow eval.   Likely episode was TIA, PT eval done.   Pt improved symptomatically now.  * malignant Htn  Permissive hypertension.  Continue home meds.    Will give amlodipine to help better control.  * ESRD on HD  Continue- nephro consult appreciated.  * DM  Cont levemir and Insulin sliding scale coverage.  * Hyperlipidemia  Rosuvastatin  * hx of CAD  Coreg, ASA, Statin.  Discharge home today.  DISCHARGE CONDITIONS:   Stable.  CONSULTS OBTAINED:  Treatment Team:  HMurlean Iba MD YLeotis Pain MD  DRUG ALLERGIES:   Allergies  Allergen Reactions  . Metformin Diarrhea  . Hydrocodone Hives    DISCHARGE MEDICATIONS:   Current Discharge Medication List    CONTINUE these medications which have NOT CHANGED   Details  albuterol (PROVENTIL HFA;VENTOLIN HFA) 108 (90 BASE) MCG/ACT inhaler Inhale 2 puffs into the lungs every 6 (six) hours as needed for wheezing or shortness of breath.    aspirin EC 81 MG tablet Take 81 mg by mouth daily.    b complex-vitamin c-folic acid (NEPHRO-VITE) 0.8 MG TABS tablet Take 1 tablet by mouth daily.    BAYER CONTOUR TEST test strip TEST BLOOD SUGARS  2 TIMES DAILY Qty: 100 each, Refills: PRN    Blood Glucose Monitoring Suppl (ONE TOUCH ULTRA SYSTEM KIT) W/DEVICE KIT Please dispense for patient to use at home to check blood sugars. Qty: 1 each, Refills: 0   Associated Diagnoses: Type II or unspecified type diabetes mellitus without mention of complication, not stated as uncontrolled    calcium acetate (PHOSLO) 667 MG capsule Take 667-2,668 mg by mouth 5 (five) times daily. Pt takes four capsules with meals and two capsules with snacks.    carvedilol (COREG) 12.5 MG tablet Take 1 tablet (12.5 mg total) by mouth 2 (two) times daily. Qty: 60 tablet, Refills: 6   Associated Diagnoses:  Essential hypertension, benign    cloNIDine (CATAPRES) 0.1 MG tablet Take 0.1 mg by mouth daily.    Insulin Detemir (LEVEMIR) 100 UNIT/ML Pen Inject 5 Units into the skin daily. Qty: 15 mL, Refills: 3    Lancets Thin MISC Using at home to test blood sugars twice daily. Qty: 200 each, Refills: 6   Associated Diagnoses: Type II or unspecified type diabetes mellitus without mention of complication, not stated as uncontrolled    mometasone (NASONEX) 50 MCG/ACT nasal spray Place 2 sprays into the nose 2 (two) times daily as needed (for allergies).    nitroGLYCERIN (NITROSTAT) 0.4 MG SL tablet Place 1 tablet (0.4 mg total) under the tongue every 5 (five) minutes as needed for chest pain. Qty: 25 tablet, Refills: 3    NOVOFINE 32G X 6 MM MISC USE WITH INUSLIN AS NEEDED Qty: 100 each, Refills: 0    oxyCODONE-acetaminophen (ROXICET) 5-325 MG per tablet Take 1 tablet by mouth every 8 (eight) hours as needed for severe pain. Qty: 60 tablet, Refills: 0   Associated Diagnoses: Right hip pain    pregabalin (LYRICA) 75 MG capsule Take 1 capsule (75 mg total) by mouth daily. Qty: 30 capsule, Refills: 3    rosuvastatin (CRESTOR) 40 MG tablet Take 1 tablet (40 mg total) by mouth daily. Qty: 90 tablet, Refills: 3      STOP taking these medications     Alcohol Swabs PADS      lidocaine (XYLOCAINE) 4 % external solution          DISCHARGE INSTRUCTIONS:    Follow with PMD in 2 weeks.  If you experience worsening of your admission symptoms, develop shortness of breath, life threatening emergency, suicidal or homicidal thoughts you must seek medical attention immediately by calling 911 or calling your MD immediately  if symptoms less severe.  You Must read complete instructions/literature along with all the possible adverse reactions/side effects for all the Medicines you take and that have been prescribed to you. Take any new Medicines after you have completely understood and accept all the  possible adverse reactions/side effects.   Please note  You were cared for by a hospitalist during your hospital stay. If you have any questions about your discharge medications or the care you received while you were in the hospital after you are discharged, you can call the unit and asked to speak with the hospitalist on call if the hospitalist that took care of you is not available. Once you are discharged, your primary care physician will handle any further medical issues. Please note that NO REFILLS for any discharge medications will be authorized once you are discharged, as it is imperative that you return to your primary care physician (or establish a relationship with a primary care physician if you do not have one)  for your aftercare needs so that they can reassess your need for medications and monitor your lab values.    Today   CHIEF COMPLAINT:   Chief Complaint  Patient presents with  . Chest Pain  . Numbness    HISTORY OF PRESENT ILLNESS:  Bibi Economos  is a 59 y.o. female with a known history of hypertension, diabetes, ESRD on hemodialysis, COPD and PVD. The patient came to the ED due to left-sided numbness, tingling and weakness today. The patient is alert, awake and oriented in no acute distress. She said that she had left-sided facial numbness 5 days ago and started to have left arm numbness, tingling and weakness. In addition, she has left leg weakness. She also complains of chest pain on the left side with the left arm numbness and tingling. She also complains of headache last night. She was treated with aspirin 81 mg 4 tablets by EMS. Her systolic blood pressure is high more than 200 the ED. Her CAT scan of head is negative for CVA. She said her Plavix was discontinued this February for preparation for kidney transplant.  VITAL SIGNS:  Blood pressure 194/62, pulse 73, temperature 98.7 F (37.1 C), temperature source Oral, resp. rate 18, height _0  (1.702 m), weight  76.295 kg (168 lb 3.2 oz), SpO2 100 %.  I/O:   Intake/Output Summary (Last 24 hours) at 11/25/14 1107 Last data filed at 11/25/14 0830  Gross per 24 hour  Intake    360 ml  Output   1000 ml  Net   -640 ml    PHYSICAL EXAMINATION:   GENERAL: 59 y.o.-year-old patient lying in the bed with no acute distress.  EYES: Pupils equal, round, reactive to light and accommodation. No scleral icterus. Extraocular muscles intact.  HEENT: Head atraumatic, normocephalic. Oropharynx and nasopharynx clear.  NECK: Supple, no jugular venous distention. No thyroid enlargement, no tenderness.  LUNGS: Normal breath sounds bilaterally, no wheezing, rales,rhonchi or crepitation. No use of accessory muscles of respiration.  CARDIOVASCULAR: S1, S2 normal. No murmurs, rubs, or gallops.  ABDOMEN: Soft, nontender, nondistended. Bowel sounds present. No organomegaly or mass.  EXTREMITIES: No pedal edema, cyanosis, or clubbing.  NEUROLOGIC: Cranial nerves II through XII are intact. Muscle strength 4/5 in left leg and 5 out of 5 in right leg and both arms. Sensation intact. Gait not checked.  PSYCHIATRIC: The patient is alert and oriented x 3.  SKIN: No obvious rash, lesion, or ulcer.   DATA REVIEW:   CBC  Recent Labs Lab 11/23/14 1921  WBC 9.2  HGB 11.1*  HCT 32.7*  PLT 152    Chemistries   Recent Labs Lab 11/23/14 1921  NA 138  K 4.6  CL 94*  CO2 33*  GLUCOSE 141*  BUN 36*  CREATININE 7.90*  CALCIUM 9.0    Cardiac Enzymes  Recent Labs Lab 11/23/14 1921  TROPONINI <0.03    Microbiology Results  Results for orders placed or performed in visit on 02/13/14  Culture, blood (single)     Status: None   Collection Time: 02/13/14  3:00 AM  Result Value Ref Range Status   Micro Text Report   Final       COMMENT                   NO GROWTH AEROBICALLY/ANAEROBICALLY IN 5 DAYS   ANTIBIOTIC  Culture, blood (single)      Status: None   Collection Time: 02/13/14  3:15 AM  Result Value Ref Range Status   Micro Text Report   Final       COMMENT                   NO GROWTH AEROBICALLY/ANAEROBICALLY IN 5 DAYS   ANTIBIOTIC                                                        RADIOLOGY:  Ct Head Wo Contrast  11/23/2014   CLINICAL DATA:  Acute onset of left-sided facial numbness, and chest pain radiating to the left arm and neck. Initial encounter.  EXAM: CT HEAD WITHOUT CONTRAST  TECHNIQUE: Contiguous axial images were obtained from the base of the skull through the vertex without intravenous contrast.  COMPARISON:  CT of the head performed 09/25/2012  FINDINGS: There is no evidence of acute infarction, mass lesion, or intra- or extra-axial hemorrhage on CT.  Mild periventricular white matter change likely reflects small vessel ischemic microangiopathy. Calcification is noted at the basal ganglia bilaterally. Mild cerebellar atrophy is noted.  The brainstem and fourth ventricle are within normal limits. The third and lateral ventricles, and basal ganglia are unremarkable in appearance. The cerebral hemispheres are symmetric in appearance, with normal gray-white differentiation. No mass effect or midline shift is seen.  There is no evidence of fracture; visualized osseous structures are unremarkable in appearance. The visualized portions of the orbits are within normal limits. The paranasal sinuses and mastoid air cells are well-aerated. No significant soft tissue abnormalities are seen.  IMPRESSION: 1. No acute intracranial pathology seen on CT. 2. Mild small vessel ischemic microangiopathy.   Electronically Signed   By: Garald Balding M.D.   On: 11/23/2014 20:33   Mr Brain Wo Contrast  11/24/2014   CLINICAL DATA:  Left sided numbness and weakness, 1 day duration.  EXAM: MRI HEAD WITHOUT CONTRAST  MRA HEAD WITHOUT CONTRAST  TECHNIQUE: Multiplanar, multiecho pulse sequences of the brain and surrounding structures were  obtained without intravenous contrast. Angiographic images of the head were obtained using MRA technique without contrast.  COMPARISON:  Head CT 11/23/2014  FINDINGS: MRI HEAD FINDINGS  Diffusion imaging does not show any acute or subacute infarction. There chronic small-vessel ischemic changes affecting the pons. There are mild chronic small-vessel changes affecting the cerebral hemispheric white matter. No cortical or large vessel territory infarction. No mass lesion, hemorrhage, hydrocephalus or extra-axial collection. No pituitary mass. No inflammatory sinus disease. No skull or skullbase lesion.  MRA HEAD FINDINGS  Both internal carotid arteries are patent into the brain. There is mild narrowing and irregularity in the siphon regions, right more than left. The anterior and middle cerebral vessels are patent without proximal stenosis, aneurysm or vascular malformation.  The right vertebral artery is the dominant vessel and is widely patent to the basilar. The left vertebral artery is non dominant and shows diminished flow at the foramen magnum level. Distal vertebral artery shows a normal flow appearance. Particularly given that there is calcification in the major vessels the base of the brain on head CT, I think this is probably a disease vessel segment. The basilar artery is widely patent. Superior cerebellar and posterior cerebral arteries appear normal.  IMPRESSION: No  acute or subacute infarction. Chronic small-vessel ischemic changes of the pons in the cerebral hemispheric white matter.  Atherosclerotic disease in the carotid siphon regions without flow limiting stenosis. Abnormal appearance of the distal left vertebral artery with diminished flow likely secondary to atherosclerotic disease. No major intracranial branch vessel occlusion.   Electronically Signed   By: Nelson Chimes M.D.   On: 11/24/2014 10:15   US Carotid Bilateral  11/24/2014   CLINICAL DATA:  Left-sided weakness.  EXAM: BILATERAL  CAROTID DUPLEX ULTRASOUND  TECHNIQUE: Pearline Cables scale imaging, color Doppler and duplex ultrasound were performed of bilateral carotid and vertebral arteries in the neck.  COMPARISON:  None.  FINDINGS: Criteria: Quantification of carotid stenosis is based on velocity parameters that correlate the residual internal carotid diameter with NASCET-based stenosis levels, using the diameter of the distal internal carotid lumen as the denominator for stenosis measurement.  The following velocity measurements were obtained:  RIGHT  ICA:  176/33 cm/sec  CCA:  638/75 cm/sec  SYSTOLIC ICA/CCA RATIO:  1.6  DIASTOLIC ICA/CCA RATIO:  2.4  ECA:  263 cm/sec  LEFT  ICA:  206/30 cm/sec  CCA:  64/33 cm/sec  SYSTOLIC ICA/CCA RATIO:  2.1  DIASTOLIC ICA/CCA RATIO:  1.5  ECA:  297 cm/sec  RIGHT CAROTID ARTERY: Mild plaque right carotid bulb. No flow limiting stenosis. Waveforms are unremarkable.  RIGHT VERTEBRAL ARTERY:  Patent antegrade flow .  LEFT CAROTID ARTERY: Mild plaque left carotid bulb. No flow limiting stenosis. Waveforms are unremarkable.  LEFT VERTEBRAL ARTERY:  Patent antegrade flow.  IMPRESSION: 1. Mild bilateral carotid bifurcation plaque. No flow limiting stenosis. Degree of stenosis less than 50%. 2. Vertebral arteries are patent with antegrade flow.   Electronically Signed   By: Marcello Moores  Register   On: 11/24/2014 11:32   Dg Chest Port 1 View  11/23/2014   CLINICAL DATA:  Chest pain  EXAM: PORTABLE CHEST - 1 VIEW  COMPARISON:  05/12/2014  FINDINGS: The heart size and mediastinal contours are within normal limits. Both lungs are clear. The visualized skeletal structures are unremarkable.  IMPRESSION: No active disease.   Electronically Signed   By: Inez Catalina M.D.   On: 11/23/2014 19:51   Mr Jodene Nam Head/brain Wo Cm  11/24/2014   CLINICAL DATA:  Left sided numbness and weakness, 1 day duration.  EXAM: MRI HEAD WITHOUT CONTRAST  MRA HEAD WITHOUT CONTRAST  TECHNIQUE: Multiplanar, multiecho pulse sequences of the brain and  surrounding structures were obtained without intravenous contrast. Angiographic images of the head were obtained using MRA technique without contrast.  COMPARISON:  Head CT 11/23/2014  FINDINGS: MRI HEAD FINDINGS  Diffusion imaging does not show any acute or subacute infarction. There chronic small-vessel ischemic changes affecting the pons. There are mild chronic small-vessel changes affecting the cerebral hemispheric white matter. No cortical or large vessel territory infarction. No mass lesion, hemorrhage, hydrocephalus or extra-axial collection. No pituitary mass. No inflammatory sinus disease. No skull or skullbase lesion.  MRA HEAD FINDINGS  Both internal carotid arteries are patent into the brain. There is mild narrowing and irregularity in the siphon regions, right more than left. The anterior and middle cerebral vessels are patent without proximal stenosis, aneurysm or vascular malformation.  The right vertebral artery is the dominant vessel and is widely patent to the basilar. The left vertebral artery is non dominant and shows diminished flow at the foramen magnum level. Distal vertebral artery shows a normal flow appearance. Particularly given that there is calcification in the major  vessels the base of the brain on head CT, I think this is probably a disease vessel segment. The basilar artery is widely patent. Superior cerebellar and posterior cerebral arteries appear normal.  IMPRESSION: No acute or subacute infarction. Chronic small-vessel ischemic changes of the pons in the cerebral hemispheric white matter.  Atherosclerotic disease in the carotid siphon regions without flow limiting stenosis. Abnormal appearance of the distal left vertebral artery with diminished flow likely secondary to atherosclerotic disease. No major intracranial branch vessel occlusion.   Electronically Signed   By: Nelson Chimes M.D.   On: 11/24/2014 10:15     Management plans discussed with the patient, family and they are  in agreement.  CODE STATUS:     Code Status Orders        Start     Ordered   11/23/14 2243  Full code   Continuous     11/23/14 2242      TOTAL TIME TAKING CARE OF THIS PATIENT: 35 minutes.    Vaughan Basta M.D on 11/25/2014 at 11:07 AM  Between 7am to 6pm - Pager - [phone]  After 6pm go to [email or web address] - password [email or web address] [facility]  Office  [phone]  CC: Primary care physician; Rica Mast, MD

## 2014-11-27 DIAGNOSIS — N2581 Secondary hyperparathyroidism of renal origin: Secondary | ICD-10-CM | POA: Diagnosis not present

## 2014-11-27 DIAGNOSIS — N186 End stage renal disease: Secondary | ICD-10-CM | POA: Diagnosis not present

## 2014-11-27 DIAGNOSIS — D631 Anemia in chronic kidney disease: Secondary | ICD-10-CM | POA: Diagnosis not present

## 2014-11-27 DIAGNOSIS — D509 Iron deficiency anemia, unspecified: Secondary | ICD-10-CM | POA: Diagnosis not present

## 2014-11-27 DIAGNOSIS — Z992 Dependence on renal dialysis: Secondary | ICD-10-CM | POA: Diagnosis not present

## 2014-11-28 DIAGNOSIS — M5116 Intervertebral disc disorders with radiculopathy, lumbar region: Secondary | ICD-10-CM

## 2014-11-28 DIAGNOSIS — M5416 Radiculopathy, lumbar region: Secondary | ICD-10-CM | POA: Diagnosis not present

## 2014-11-28 DIAGNOSIS — M5136 Other intervertebral disc degeneration, lumbar region: Secondary | ICD-10-CM | POA: Diagnosis not present

## 2014-11-28 HISTORY — DX: Intervertebral disc disorders with radiculopathy, lumbar region: M51.16

## 2014-11-29 DIAGNOSIS — D509 Iron deficiency anemia, unspecified: Secondary | ICD-10-CM | POA: Diagnosis not present

## 2014-11-29 DIAGNOSIS — Z992 Dependence on renal dialysis: Secondary | ICD-10-CM | POA: Diagnosis not present

## 2014-11-29 DIAGNOSIS — N186 End stage renal disease: Secondary | ICD-10-CM | POA: Diagnosis not present

## 2014-11-29 DIAGNOSIS — D631 Anemia in chronic kidney disease: Secondary | ICD-10-CM | POA: Diagnosis not present

## 2014-11-29 DIAGNOSIS — N2581 Secondary hyperparathyroidism of renal origin: Secondary | ICD-10-CM | POA: Diagnosis not present

## 2014-12-01 DIAGNOSIS — D509 Iron deficiency anemia, unspecified: Secondary | ICD-10-CM | POA: Diagnosis not present

## 2014-12-01 DIAGNOSIS — D631 Anemia in chronic kidney disease: Secondary | ICD-10-CM | POA: Diagnosis not present

## 2014-12-01 DIAGNOSIS — N186 End stage renal disease: Secondary | ICD-10-CM | POA: Diagnosis not present

## 2014-12-01 DIAGNOSIS — N2581 Secondary hyperparathyroidism of renal origin: Secondary | ICD-10-CM | POA: Diagnosis not present

## 2014-12-01 DIAGNOSIS — Z992 Dependence on renal dialysis: Secondary | ICD-10-CM | POA: Diagnosis not present

## 2014-12-04 DIAGNOSIS — N2581 Secondary hyperparathyroidism of renal origin: Secondary | ICD-10-CM | POA: Diagnosis not present

## 2014-12-04 DIAGNOSIS — D631 Anemia in chronic kidney disease: Secondary | ICD-10-CM | POA: Diagnosis not present

## 2014-12-04 DIAGNOSIS — D509 Iron deficiency anemia, unspecified: Secondary | ICD-10-CM | POA: Diagnosis not present

## 2014-12-04 DIAGNOSIS — N186 End stage renal disease: Secondary | ICD-10-CM | POA: Diagnosis not present

## 2014-12-04 DIAGNOSIS — Z992 Dependence on renal dialysis: Secondary | ICD-10-CM | POA: Diagnosis not present

## 2014-12-06 DIAGNOSIS — D631 Anemia in chronic kidney disease: Secondary | ICD-10-CM | POA: Diagnosis not present

## 2014-12-06 DIAGNOSIS — D509 Iron deficiency anemia, unspecified: Secondary | ICD-10-CM | POA: Diagnosis not present

## 2014-12-06 DIAGNOSIS — Z992 Dependence on renal dialysis: Secondary | ICD-10-CM | POA: Diagnosis not present

## 2014-12-06 DIAGNOSIS — N186 End stage renal disease: Secondary | ICD-10-CM | POA: Diagnosis not present

## 2014-12-06 DIAGNOSIS — N2581 Secondary hyperparathyroidism of renal origin: Secondary | ICD-10-CM | POA: Diagnosis not present

## 2014-12-07 DIAGNOSIS — I739 Peripheral vascular disease, unspecified: Secondary | ICD-10-CM | POA: Diagnosis not present

## 2014-12-07 DIAGNOSIS — Y841 Kidney dialysis as the cause of abnormal reaction of the patient, or of later complication, without mention of misadventure at the time of the procedure: Secondary | ICD-10-CM | POA: Diagnosis not present

## 2014-12-07 DIAGNOSIS — I10 Essential (primary) hypertension: Secondary | ICD-10-CM | POA: Diagnosis not present

## 2014-12-07 DIAGNOSIS — E785 Hyperlipidemia, unspecified: Secondary | ICD-10-CM | POA: Diagnosis not present

## 2014-12-07 DIAGNOSIS — E119 Type 2 diabetes mellitus without complications: Secondary | ICD-10-CM | POA: Diagnosis not present

## 2014-12-07 DIAGNOSIS — N186 End stage renal disease: Secondary | ICD-10-CM | POA: Diagnosis not present

## 2014-12-07 DIAGNOSIS — Z992 Dependence on renal dialysis: Secondary | ICD-10-CM | POA: Diagnosis not present

## 2014-12-08 DIAGNOSIS — N186 End stage renal disease: Secondary | ICD-10-CM | POA: Diagnosis not present

## 2014-12-08 DIAGNOSIS — D631 Anemia in chronic kidney disease: Secondary | ICD-10-CM | POA: Diagnosis not present

## 2014-12-08 DIAGNOSIS — D509 Iron deficiency anemia, unspecified: Secondary | ICD-10-CM | POA: Diagnosis not present

## 2014-12-08 DIAGNOSIS — N2581 Secondary hyperparathyroidism of renal origin: Secondary | ICD-10-CM | POA: Diagnosis not present

## 2014-12-08 DIAGNOSIS — Z992 Dependence on renal dialysis: Secondary | ICD-10-CM | POA: Diagnosis not present

## 2014-12-11 DIAGNOSIS — Z992 Dependence on renal dialysis: Secondary | ICD-10-CM | POA: Diagnosis not present

## 2014-12-11 DIAGNOSIS — N186 End stage renal disease: Secondary | ICD-10-CM | POA: Diagnosis not present

## 2014-12-11 DIAGNOSIS — D509 Iron deficiency anemia, unspecified: Secondary | ICD-10-CM | POA: Diagnosis not present

## 2014-12-11 DIAGNOSIS — D631 Anemia in chronic kidney disease: Secondary | ICD-10-CM | POA: Diagnosis not present

## 2014-12-11 DIAGNOSIS — N2581 Secondary hyperparathyroidism of renal origin: Secondary | ICD-10-CM | POA: Diagnosis not present

## 2014-12-13 DIAGNOSIS — D631 Anemia in chronic kidney disease: Secondary | ICD-10-CM | POA: Diagnosis not present

## 2014-12-13 DIAGNOSIS — Z992 Dependence on renal dialysis: Secondary | ICD-10-CM | POA: Diagnosis not present

## 2014-12-13 DIAGNOSIS — N2581 Secondary hyperparathyroidism of renal origin: Secondary | ICD-10-CM | POA: Diagnosis not present

## 2014-12-13 DIAGNOSIS — N186 End stage renal disease: Secondary | ICD-10-CM | POA: Diagnosis not present

## 2014-12-13 DIAGNOSIS — D509 Iron deficiency anemia, unspecified: Secondary | ICD-10-CM | POA: Diagnosis not present

## 2014-12-15 DIAGNOSIS — D631 Anemia in chronic kidney disease: Secondary | ICD-10-CM | POA: Diagnosis not present

## 2014-12-15 DIAGNOSIS — Z992 Dependence on renal dialysis: Secondary | ICD-10-CM | POA: Diagnosis not present

## 2014-12-15 DIAGNOSIS — N186 End stage renal disease: Secondary | ICD-10-CM | POA: Diagnosis not present

## 2014-12-15 DIAGNOSIS — D509 Iron deficiency anemia, unspecified: Secondary | ICD-10-CM | POA: Diagnosis not present

## 2014-12-15 DIAGNOSIS — N2581 Secondary hyperparathyroidism of renal origin: Secondary | ICD-10-CM | POA: Diagnosis not present

## 2014-12-18 DIAGNOSIS — Z992 Dependence on renal dialysis: Secondary | ICD-10-CM | POA: Diagnosis not present

## 2014-12-18 DIAGNOSIS — N2581 Secondary hyperparathyroidism of renal origin: Secondary | ICD-10-CM | POA: Diagnosis not present

## 2014-12-18 DIAGNOSIS — D631 Anemia in chronic kidney disease: Secondary | ICD-10-CM | POA: Diagnosis not present

## 2014-12-18 DIAGNOSIS — N186 End stage renal disease: Secondary | ICD-10-CM | POA: Diagnosis not present

## 2014-12-18 DIAGNOSIS — D509 Iron deficiency anemia, unspecified: Secondary | ICD-10-CM | POA: Diagnosis not present

## 2014-12-19 ENCOUNTER — Encounter: Payer: Self-pay | Admitting: Internal Medicine

## 2014-12-19 ENCOUNTER — Ambulatory Visit (INDEPENDENT_AMBULATORY_CARE_PROVIDER_SITE_OTHER): Payer: Medicare Other | Admitting: Internal Medicine

## 2014-12-19 VITALS — BP 171/65 | HR 68 | Temp 98.5°F | Ht 67.5 in | Wt 168.4 lb

## 2014-12-19 DIAGNOSIS — M5116 Intervertebral disc disorders with radiculopathy, lumbar region: Secondary | ICD-10-CM

## 2014-12-19 DIAGNOSIS — N189 Chronic kidney disease, unspecified: Secondary | ICD-10-CM

## 2014-12-19 DIAGNOSIS — E1122 Type 2 diabetes mellitus with diabetic chronic kidney disease: Secondary | ICD-10-CM | POA: Diagnosis not present

## 2014-12-19 DIAGNOSIS — I639 Cerebral infarction, unspecified: Secondary | ICD-10-CM

## 2014-12-19 DIAGNOSIS — Z992 Dependence on renal dialysis: Secondary | ICD-10-CM | POA: Diagnosis not present

## 2014-12-19 DIAGNOSIS — N186 End stage renal disease: Secondary | ICD-10-CM | POA: Diagnosis not present

## 2014-12-19 DIAGNOSIS — I10 Essential (primary) hypertension: Secondary | ICD-10-CM | POA: Diagnosis not present

## 2014-12-19 LAB — HM DIABETES FOOT EXAM: HM Diabetic Foot Exam: NORMAL

## 2014-12-19 NOTE — Progress Notes (Signed)
Pre visit review using our clinic review tool, if applicable. No additional management support is needed unless otherwise documented below in the visit note. 

## 2014-12-19 NOTE — Assessment & Plan Note (Signed)
BP Readings from Last 3 Encounters:  12/19/14 171/65  11/25/14 194/62  09/27/14 170/86   BP elevated today, however did not take medication this morning. Will continue to monitor BP with HD.

## 2014-12-19 NOTE — Assessment & Plan Note (Signed)
BG well controlled on very low dose of Levemir. Will request recent labs including A1c from [facility].

## 2014-12-19 NOTE — Assessment & Plan Note (Signed)
Continue HD with [facility] in [place] on TTS schedule. She is working on transplant evaluation.

## 2014-12-19 NOTE — Patient Instructions (Addendum)
Call Dr. Sharlet Salina to see if earlier appointment available to discuss alternative interventions for back pain.  Follow up in 3 months.

## 2014-12-19 NOTE — Progress Notes (Signed)
Subjective:    Patient ID: Theresa [name], female    DOB: 11/03/1955, 59 y.o.   MRN: [id number]  HPI  59YO female presents for follow up.  DM - BG well controlled. Reports A1c was 4.9% when recently checked at HD. Taking 5units of Levemir daily.  Chronic back pain - s/p ESI with Dr. Sharlet Salina in June. No improvement. Having severe mid back pain. Rated as 8/10. Some weakness noted in right leg. Has follow up with Dr. Sharlet Salina in August. [place] only with no improvement. Tried Percocet with no improvement.   Past medical, surgical, family and social history per today's encounter.  Review of Systems  Constitutional: Negative for fever, chills, appetite change, fatigue and unexpected weight change.  Eyes: Negative for visual disturbance.  Respiratory: Negative for shortness of breath.   Cardiovascular: Negative for chest pain and leg swelling.  Gastrointestinal: Negative for nausea, vomiting, abdominal pain, diarrhea and constipation.  Musculoskeletal: Positive for myalgias, back pain and arthralgias.  Skin: Negative for color change and rash.  Hematological: Negative for adenopathy. Does not bruise/bleed easily.  Psychiatric/Behavioral: Negative for sleep disturbance and dysphoric mood. The patient is not nervous/anxious.        Objective:    BP 171/65 mmHg  Pulse 68  Temp(Src) 98.5 F (36.9 C) (Oral)  Ht 5' 7.5" (1.715 m)  Wt 168 lb 6 oz (76.374 kg)  BMI 25.97 kg/m2  SpO2 97% Physical Exam  Constitutional: She is oriented to person, place, and time. She appears well-developed and well-nourished. No distress.  HENT:  Head: Normocephalic and atraumatic.  Right Ear: External ear normal.  Left Ear: External ear normal.  Nose: Nose normal.  Mouth/Throat: Oropharynx is clear and moist. No oropharyngeal exudate.  Eyes: Conjunctivae are normal. Pupils are equal, round, and reactive to light. Right eye exhibits no discharge. Left eye exhibits no discharge. No scleral icterus.    Neck: Normal range of motion. Neck supple. No tracheal deviation present. No thyromegaly present.  Cardiovascular: Normal rate, regular rhythm, normal heart sounds and intact distal pulses.  Exam reveals no gallop and no friction rub.   No murmur heard. Pulmonary/Chest: Effort normal and breath sounds normal. No respiratory distress. She has no wheezes. She has no rales. She exhibits no tenderness.  Musculoskeletal: She exhibits no edema.       Lumbar back: She exhibits decreased range of motion, tenderness and pain. She exhibits no bony tenderness.  Lymphadenopathy:    She has no cervical adenopathy.  Neurological: She is alert and oriented to person, place, and time. No cranial nerve deficit. She exhibits normal muscle tone. Coordination normal.  Skin: Skin is warm and dry. No rash noted. She is not diaphoretic. No erythema. No pallor.  Psychiatric: She has a normal mood and affect. Her behavior is normal. Judgment and thought content normal.          Assessment & Plan:   Problem List Items Addressed This Visit      Unprioritized   Diabetes mellitus, type II    BG well controlled on very low dose of Levemir. Will request recent labs including A1c from [facility].      Relevant Orders   Ambulatory referral to Ophthalmology   ESRD on hemodialysis - Primary    Continue HD with [facility] in [place] on TTS schedule. She is working on transplant evaluation.      Essential (primary) hypertension    BP Readings from Last 3 Encounters:  12/19/14 171/65  11/25/14 194/62  09/27/14 170/86   BP elevated today, however did not take medication this morning. Will continue to monitor BP with HD.      Neuritis or radiculitis due to rupture of lumbar intervertebral disc    Reviewed MRI lumbar spine and notes from Dr. Sharlet Salina. Severe back pain despite ESI and trial of narcotic pain medication. Question if she might be candidate for either surgical intervention or long acting narcotic.  Recommended follow up with Dr. Sharlet Salina.          Return in about 3 months (around 03/21/2015) for Recheck of Diabetes.

## 2014-12-19 NOTE — Assessment & Plan Note (Signed)
Reviewed MRI lumbar spine and notes from Dr. Sharlet Salina. Severe back pain despite ESI and trial of narcotic pain medication. Question if she might be candidate for either surgical intervention or long acting narcotic. Recommended follow up with Dr. Sharlet Salina.

## 2014-12-20 DIAGNOSIS — N186 End stage renal disease: Secondary | ICD-10-CM | POA: Diagnosis not present

## 2014-12-20 DIAGNOSIS — Z992 Dependence on renal dialysis: Secondary | ICD-10-CM | POA: Diagnosis not present

## 2014-12-20 DIAGNOSIS — D631 Anemia in chronic kidney disease: Secondary | ICD-10-CM | POA: Diagnosis not present

## 2014-12-20 DIAGNOSIS — D509 Iron deficiency anemia, unspecified: Secondary | ICD-10-CM | POA: Diagnosis not present

## 2014-12-20 DIAGNOSIS — N2581 Secondary hyperparathyroidism of renal origin: Secondary | ICD-10-CM | POA: Diagnosis not present

## 2014-12-22 DIAGNOSIS — D631 Anemia in chronic kidney disease: Secondary | ICD-10-CM | POA: Diagnosis not present

## 2014-12-22 DIAGNOSIS — N186 End stage renal disease: Secondary | ICD-10-CM | POA: Diagnosis not present

## 2014-12-22 DIAGNOSIS — D509 Iron deficiency anemia, unspecified: Secondary | ICD-10-CM | POA: Diagnosis not present

## 2014-12-22 DIAGNOSIS — N2581 Secondary hyperparathyroidism of renal origin: Secondary | ICD-10-CM | POA: Diagnosis not present

## 2014-12-22 DIAGNOSIS — Z992 Dependence on renal dialysis: Secondary | ICD-10-CM | POA: Diagnosis not present

## 2014-12-25 DIAGNOSIS — Z992 Dependence on renal dialysis: Secondary | ICD-10-CM | POA: Diagnosis not present

## 2014-12-25 DIAGNOSIS — D509 Iron deficiency anemia, unspecified: Secondary | ICD-10-CM | POA: Diagnosis not present

## 2014-12-25 DIAGNOSIS — N2581 Secondary hyperparathyroidism of renal origin: Secondary | ICD-10-CM | POA: Diagnosis not present

## 2014-12-25 DIAGNOSIS — D631 Anemia in chronic kidney disease: Secondary | ICD-10-CM | POA: Diagnosis not present

## 2014-12-25 DIAGNOSIS — N186 End stage renal disease: Secondary | ICD-10-CM | POA: Diagnosis not present

## 2014-12-26 ENCOUNTER — Ambulatory Visit (INDEPENDENT_AMBULATORY_CARE_PROVIDER_SITE_OTHER): Payer: Medicare Other | Admitting: Podiatry

## 2014-12-26 DIAGNOSIS — B351 Tinea unguium: Secondary | ICD-10-CM | POA: Diagnosis not present

## 2014-12-26 DIAGNOSIS — M79676 Pain in unspecified toe(s): Secondary | ICD-10-CM

## 2014-12-26 DIAGNOSIS — E1140 Type 2 diabetes mellitus with diabetic neuropathy, unspecified: Secondary | ICD-10-CM

## 2014-12-26 NOTE — Progress Notes (Signed)
Patient ID: KOLENE CORLESS, female   DOB: 06/10/1955, 59 y.o.   MRN: [id number]  Subjective: 59 y.o.-year-old female returns the office today for painful, elongated, thickened toenails. Denies any redness or drainage around the nails. Denies any acute changes since last appointment and no new complaints today. Denies any systemic complaints such as fevers, chills, nausea, vomiting.   Objective: AAO 3, NAD DP/PT pulses palpable, CRT less than 3 seconds Protective sensation decreased with Simms Weinstein monofilament, Achilles tendon reflex intact.  Nails hypertrophic, dystrophic, elongated, brittle, discolored 10. There is tenderness overlying the nails 15 bilaterally. There is no surrounding erythema or drainage along the nail sites. No open lesions or pre-ulcerative lesions are identified. Scar dorsal right foot.  No other areas of tenderness bilateral lower extremities. No overlying edema, erythema, increased warmth. No pain with calf compression, swelling, warmth, erythema.  Assessment: Patient presents with symptomatic onychomycosis  Plan: -Treatment options including alternatives, risks, complications were discussed -Nails sharply debrided 10 without complication/bleeding. -Discussed daily foot inspection. If there are any changes, to call the office immediately.  -Follow-up in 3 months or sooner if any problems are to arise. In the meantime, encouraged to call the office with any questions, concerns, changes symptoms.  Celesta Gentile, DPM

## 2014-12-27 DIAGNOSIS — D509 Iron deficiency anemia, unspecified: Secondary | ICD-10-CM | POA: Diagnosis not present

## 2014-12-27 DIAGNOSIS — N186 End stage renal disease: Secondary | ICD-10-CM | POA: Diagnosis not present

## 2014-12-27 DIAGNOSIS — D631 Anemia in chronic kidney disease: Secondary | ICD-10-CM | POA: Diagnosis not present

## 2014-12-27 DIAGNOSIS — Z992 Dependence on renal dialysis: Secondary | ICD-10-CM | POA: Diagnosis not present

## 2014-12-27 DIAGNOSIS — N2581 Secondary hyperparathyroidism of renal origin: Secondary | ICD-10-CM | POA: Diagnosis not present

## 2014-12-29 DIAGNOSIS — N2581 Secondary hyperparathyroidism of renal origin: Secondary | ICD-10-CM | POA: Diagnosis not present

## 2014-12-29 DIAGNOSIS — N186 End stage renal disease: Secondary | ICD-10-CM | POA: Diagnosis not present

## 2014-12-29 DIAGNOSIS — Z992 Dependence on renal dialysis: Secondary | ICD-10-CM | POA: Diagnosis not present

## 2014-12-29 DIAGNOSIS — D509 Iron deficiency anemia, unspecified: Secondary | ICD-10-CM | POA: Diagnosis not present

## 2014-12-29 DIAGNOSIS — D631 Anemia in chronic kidney disease: Secondary | ICD-10-CM | POA: Diagnosis not present

## 2015-01-01 DIAGNOSIS — N2581 Secondary hyperparathyroidism of renal origin: Secondary | ICD-10-CM | POA: Diagnosis not present

## 2015-01-01 DIAGNOSIS — D509 Iron deficiency anemia, unspecified: Secondary | ICD-10-CM | POA: Diagnosis not present

## 2015-01-01 DIAGNOSIS — Z992 Dependence on renal dialysis: Secondary | ICD-10-CM | POA: Diagnosis not present

## 2015-01-01 DIAGNOSIS — N186 End stage renal disease: Secondary | ICD-10-CM | POA: Diagnosis not present

## 2015-01-01 DIAGNOSIS — D631 Anemia in chronic kidney disease: Secondary | ICD-10-CM | POA: Diagnosis not present

## 2015-01-01 NOTE — Patient Outreach (Signed)
[place] [facility]) Care Management  01/01/2015  GAURI CHEW [birth year] [id number]   Referral from [place] List, assigned Maury Dus, RN to outreach.  Ronnell Freshwater. [place], [place] Management [place] Assistant Phone: [phone] Fax: [phone]

## 2015-01-03 DIAGNOSIS — D631 Anemia in chronic kidney disease: Secondary | ICD-10-CM | POA: Diagnosis not present

## 2015-01-03 DIAGNOSIS — N186 End stage renal disease: Secondary | ICD-10-CM | POA: Diagnosis not present

## 2015-01-03 DIAGNOSIS — N2581 Secondary hyperparathyroidism of renal origin: Secondary | ICD-10-CM | POA: Diagnosis not present

## 2015-01-03 DIAGNOSIS — Z992 Dependence on renal dialysis: Secondary | ICD-10-CM | POA: Diagnosis not present

## 2015-01-03 DIAGNOSIS — D509 Iron deficiency anemia, unspecified: Secondary | ICD-10-CM | POA: Diagnosis not present

## 2015-01-04 DIAGNOSIS — Z1231 Encounter for screening mammogram for malignant neoplasm of breast: Secondary | ICD-10-CM | POA: Diagnosis not present

## 2015-01-04 DIAGNOSIS — R921 Mammographic calcification found on diagnostic imaging of breast: Secondary | ICD-10-CM | POA: Diagnosis not present

## 2015-01-05 DIAGNOSIS — Z992 Dependence on renal dialysis: Secondary | ICD-10-CM | POA: Diagnosis not present

## 2015-01-05 DIAGNOSIS — D631 Anemia in chronic kidney disease: Secondary | ICD-10-CM | POA: Diagnosis not present

## 2015-01-05 DIAGNOSIS — D509 Iron deficiency anemia, unspecified: Secondary | ICD-10-CM | POA: Diagnosis not present

## 2015-01-05 DIAGNOSIS — N2581 Secondary hyperparathyroidism of renal origin: Secondary | ICD-10-CM | POA: Diagnosis not present

## 2015-01-05 DIAGNOSIS — N186 End stage renal disease: Secondary | ICD-10-CM | POA: Diagnosis not present

## 2015-01-08 ENCOUNTER — Other Ambulatory Visit: Payer: Self-pay | Admitting: Internal Medicine

## 2015-01-08 DIAGNOSIS — M5416 Radiculopathy, lumbar region: Secondary | ICD-10-CM | POA: Diagnosis not present

## 2015-01-08 DIAGNOSIS — D631 Anemia in chronic kidney disease: Secondary | ICD-10-CM | POA: Diagnosis not present

## 2015-01-08 DIAGNOSIS — N2581 Secondary hyperparathyroidism of renal origin: Secondary | ICD-10-CM | POA: Diagnosis not present

## 2015-01-08 DIAGNOSIS — D509 Iron deficiency anemia, unspecified: Secondary | ICD-10-CM | POA: Diagnosis not present

## 2015-01-08 DIAGNOSIS — N186 End stage renal disease: Secondary | ICD-10-CM | POA: Diagnosis not present

## 2015-01-08 DIAGNOSIS — M5136 Other intervertebral disc degeneration, lumbar region: Secondary | ICD-10-CM | POA: Diagnosis not present

## 2015-01-08 DIAGNOSIS — Z992 Dependence on renal dialysis: Secondary | ICD-10-CM | POA: Diagnosis not present

## 2015-01-10 DIAGNOSIS — N2581 Secondary hyperparathyroidism of renal origin: Secondary | ICD-10-CM | POA: Diagnosis not present

## 2015-01-10 DIAGNOSIS — D631 Anemia in chronic kidney disease: Secondary | ICD-10-CM | POA: Diagnosis not present

## 2015-01-10 DIAGNOSIS — D509 Iron deficiency anemia, unspecified: Secondary | ICD-10-CM | POA: Diagnosis not present

## 2015-01-10 DIAGNOSIS — Z992 Dependence on renal dialysis: Secondary | ICD-10-CM | POA: Diagnosis not present

## 2015-01-10 DIAGNOSIS — N186 End stage renal disease: Secondary | ICD-10-CM | POA: Diagnosis not present

## 2015-01-12 DIAGNOSIS — N2581 Secondary hyperparathyroidism of renal origin: Secondary | ICD-10-CM | POA: Diagnosis not present

## 2015-01-12 DIAGNOSIS — Z992 Dependence on renal dialysis: Secondary | ICD-10-CM | POA: Diagnosis not present

## 2015-01-12 DIAGNOSIS — N186 End stage renal disease: Secondary | ICD-10-CM | POA: Diagnosis not present

## 2015-01-12 DIAGNOSIS — D631 Anemia in chronic kidney disease: Secondary | ICD-10-CM | POA: Diagnosis not present

## 2015-01-12 DIAGNOSIS — D509 Iron deficiency anemia, unspecified: Secondary | ICD-10-CM | POA: Diagnosis not present

## 2015-01-15 ENCOUNTER — Encounter: Payer: Self-pay | Admitting: Internal Medicine

## 2015-01-15 DIAGNOSIS — D631 Anemia in chronic kidney disease: Secondary | ICD-10-CM | POA: Diagnosis not present

## 2015-01-15 DIAGNOSIS — N186 End stage renal disease: Secondary | ICD-10-CM | POA: Diagnosis not present

## 2015-01-15 DIAGNOSIS — D509 Iron deficiency anemia, unspecified: Secondary | ICD-10-CM | POA: Diagnosis not present

## 2015-01-15 DIAGNOSIS — N2581 Secondary hyperparathyroidism of renal origin: Secondary | ICD-10-CM | POA: Diagnosis not present

## 2015-01-15 DIAGNOSIS — Z992 Dependence on renal dialysis: Secondary | ICD-10-CM | POA: Diagnosis not present

## 2015-01-16 ENCOUNTER — Other Ambulatory Visit: Payer: Self-pay

## 2015-01-16 NOTE — Patient Outreach (Signed)
[place] [facility]) Care Management  01/16/2015  Theresa [name] 04-10-1956 [id number]  Outreach call to assess care management needs. No answer. HIPPA compliant message left.  Plan: follow up call within the next 1-2 days.  [name], RN, MSN, [place] Coordinator Cell: [phone]

## 2015-01-17 ENCOUNTER — Other Ambulatory Visit: Payer: Self-pay

## 2015-01-17 DIAGNOSIS — N186 End stage renal disease: Secondary | ICD-10-CM | POA: Diagnosis not present

## 2015-01-17 DIAGNOSIS — Z992 Dependence on renal dialysis: Secondary | ICD-10-CM | POA: Diagnosis not present

## 2015-01-17 DIAGNOSIS — N2581 Secondary hyperparathyroidism of renal origin: Secondary | ICD-10-CM | POA: Diagnosis not present

## 2015-01-17 DIAGNOSIS — D509 Iron deficiency anemia, unspecified: Secondary | ICD-10-CM | POA: Diagnosis not present

## 2015-01-17 DIAGNOSIS — D631 Anemia in chronic kidney disease: Secondary | ICD-10-CM | POA: Diagnosis not present

## 2015-01-17 NOTE — Patient Outreach (Signed)
[place] [facility]) Care Management  01/17/2015  Theresa [name] 12-16-1955 [id number]  Assessment: Referral from high risk list. History of Heart failure, COPD, 3 admissions and 3 emergency room visits in the past 12 months. Member answered and states she will call care coordinator back because she is on her way to dialysis. Contact information provided.  Plan: await return call to further explain Care Management services. Care Coordinator will call by the end of the week if no return call.  [name], RN, MSN, [place] Coordinator Cell: [phone]

## 2015-01-19 ENCOUNTER — Other Ambulatory Visit: Payer: Self-pay

## 2015-01-19 DIAGNOSIS — N186 End stage renal disease: Secondary | ICD-10-CM | POA: Diagnosis not present

## 2015-01-19 DIAGNOSIS — Z992 Dependence on renal dialysis: Secondary | ICD-10-CM | POA: Diagnosis not present

## 2015-01-19 DIAGNOSIS — D509 Iron deficiency anemia, unspecified: Secondary | ICD-10-CM | POA: Diagnosis not present

## 2015-01-19 DIAGNOSIS — D631 Anemia in chronic kidney disease: Secondary | ICD-10-CM | POA: Diagnosis not present

## 2015-01-19 DIAGNOSIS — N2581 Secondary hyperparathyroidism of renal origin: Secondary | ICD-10-CM | POA: Diagnosis not present

## 2015-01-19 NOTE — Patient Outreach (Signed)
[place] [facility]) Care Management  01/19/2015  KIRAN LUPOLI Jan 26, 1956 [id number]  Referral per high risk list. Call to discuss [place] management program. No answer. HIPPA compliant message left. Second attempt.  Plan: Follow up call next week.  [name], RN, MSN, [place] Coordinator Cell: [phone]

## 2015-01-22 ENCOUNTER — Other Ambulatory Visit: Payer: Self-pay

## 2015-01-22 DIAGNOSIS — D509 Iron deficiency anemia, unspecified: Secondary | ICD-10-CM | POA: Diagnosis not present

## 2015-01-22 DIAGNOSIS — N186 End stage renal disease: Secondary | ICD-10-CM | POA: Diagnosis not present

## 2015-01-22 DIAGNOSIS — D631 Anemia in chronic kidney disease: Secondary | ICD-10-CM | POA: Diagnosis not present

## 2015-01-22 DIAGNOSIS — Z992 Dependence on renal dialysis: Secondary | ICD-10-CM | POA: Diagnosis not present

## 2015-01-22 DIAGNOSIS — N2581 Secondary hyperparathyroidism of renal origin: Secondary | ICD-10-CM | POA: Diagnosis not present

## 2015-01-22 NOTE — Patient Outreach (Signed)
[place] [facility]) Care Management  01/22/2015  Theresa [name] 03-Apr-1956 [id number]   Assessment: High risk list referral: Heart failure, COPD, 3 admissions, 3 emergency room visits in past 12 months. One successful outreach, but member was not available to talk and stated she would call care coordinator back-No return call. In addition, three unsuccessful outreach calls to member-HIPPA compliant messages left.   Plan: send outreach letter.    Theresa Silversmith, RN, MSN, [place] Coordinator Cell: [phone]

## 2015-01-24 DIAGNOSIS — D509 Iron deficiency anemia, unspecified: Secondary | ICD-10-CM | POA: Diagnosis not present

## 2015-01-24 DIAGNOSIS — Z992 Dependence on renal dialysis: Secondary | ICD-10-CM | POA: Diagnosis not present

## 2015-01-24 DIAGNOSIS — D631 Anemia in chronic kidney disease: Secondary | ICD-10-CM | POA: Diagnosis not present

## 2015-01-24 DIAGNOSIS — N186 End stage renal disease: Secondary | ICD-10-CM | POA: Diagnosis not present

## 2015-01-24 DIAGNOSIS — N2581 Secondary hyperparathyroidism of renal origin: Secondary | ICD-10-CM | POA: Diagnosis not present

## 2015-01-26 DIAGNOSIS — Z992 Dependence on renal dialysis: Secondary | ICD-10-CM | POA: Diagnosis not present

## 2015-01-26 DIAGNOSIS — D509 Iron deficiency anemia, unspecified: Secondary | ICD-10-CM | POA: Diagnosis not present

## 2015-01-26 DIAGNOSIS — D631 Anemia in chronic kidney disease: Secondary | ICD-10-CM | POA: Diagnosis not present

## 2015-01-26 DIAGNOSIS — N2581 Secondary hyperparathyroidism of renal origin: Secondary | ICD-10-CM | POA: Diagnosis not present

## 2015-01-26 DIAGNOSIS — N186 End stage renal disease: Secondary | ICD-10-CM | POA: Diagnosis not present

## 2015-01-29 DIAGNOSIS — N2581 Secondary hyperparathyroidism of renal origin: Secondary | ICD-10-CM | POA: Diagnosis not present

## 2015-01-29 DIAGNOSIS — D509 Iron deficiency anemia, unspecified: Secondary | ICD-10-CM | POA: Diagnosis not present

## 2015-01-29 DIAGNOSIS — Z992 Dependence on renal dialysis: Secondary | ICD-10-CM | POA: Diagnosis not present

## 2015-01-29 DIAGNOSIS — N186 End stage renal disease: Secondary | ICD-10-CM | POA: Diagnosis not present

## 2015-01-29 DIAGNOSIS — D631 Anemia in chronic kidney disease: Secondary | ICD-10-CM | POA: Diagnosis not present

## 2015-01-30 ENCOUNTER — Ambulatory Visit (INDEPENDENT_AMBULATORY_CARE_PROVIDER_SITE_OTHER): Payer: Medicare Other | Admitting: Cardiovascular Disease

## 2015-01-30 ENCOUNTER — Encounter: Payer: Self-pay | Admitting: Cardiovascular Disease

## 2015-01-30 ENCOUNTER — Telehealth: Payer: Self-pay | Admitting: *Deleted

## 2015-01-30 VITALS — BP 188/70 | HR 77 | Ht 67.5 in | Wt 167.2 lb

## 2015-01-30 DIAGNOSIS — I10 Essential (primary) hypertension: Secondary | ICD-10-CM

## 2015-01-30 DIAGNOSIS — I639 Cerebral infarction, unspecified: Secondary | ICD-10-CM | POA: Diagnosis not present

## 2015-01-30 DIAGNOSIS — I214 Non-ST elevation (NSTEMI) myocardial infarction: Secondary | ICD-10-CM

## 2015-01-30 DIAGNOSIS — N186 End stage renal disease: Secondary | ICD-10-CM

## 2015-01-30 DIAGNOSIS — N189 Chronic kidney disease, unspecified: Secondary | ICD-10-CM

## 2015-01-30 DIAGNOSIS — R079 Chest pain, unspecified: Secondary | ICD-10-CM

## 2015-01-30 DIAGNOSIS — E1122 Type 2 diabetes mellitus with diabetic chronic kidney disease: Secondary | ICD-10-CM

## 2015-01-30 DIAGNOSIS — R000 Tachycardia, unspecified: Secondary | ICD-10-CM

## 2015-01-30 DIAGNOSIS — E785 Hyperlipidemia, unspecified: Secondary | ICD-10-CM

## 2015-01-30 MED ORDER — CLONIDINE HCL 0.1 MG PO TABS: 0.1000 mg | ORAL_TABLET | Freq: Two times a day (BID) | ORAL | Status: DC

## 2015-01-30 NOTE — Assessment & Plan Note (Signed)
Cholesterol is at goal on the current lipid regimen. No changes to the medications were made.  

## 2015-01-30 NOTE — Assessment & Plan Note (Signed)
Most recent hemoglobin A1c well controlled

## 2015-01-30 NOTE — Assessment & Plan Note (Signed)
Tolerating dialysis Monday, Wednesday, Friday Dry weight 76.5 kg

## 2015-01-30 NOTE — Assessment & Plan Note (Signed)
Recommended she restart clonidine 0.1 mg twice a day. Suggested she closely monitor her blood pressure at home. She does report that nephrology will be at dialysis and can help her manage her blood pressure. Recommend she call us with blood pressure numbers from dialysis.

## 2015-01-30 NOTE — Assessment & Plan Note (Signed)
She denies any recent episodes of tachycardia. Recommended she stay on her carvedilol. Dose could be increased if needed for hypertension

## 2015-01-30 NOTE — Telephone Encounter (Signed)
lmov for patient to make her 80m fu from 01/30/15

## 2015-01-30 NOTE — Assessment & Plan Note (Signed)
Currently with no symptoms of angina. No further workup at this time. Continue current medication regimen. 

## 2015-01-30 NOTE — Patient Instructions (Signed)
You are doing well.  Please start clonidine one pill twice a day Monitor blood pressure closely  Please call us if you have new issues that need to be addressed before your next appt.  Your physician wants you to follow-up in: 3 months.  You will receive a reminder letter in the mail two months in advance. If you don't receive a letter, please call our office to schedule the follow-up appointment.

## 2015-01-30 NOTE — Progress Notes (Signed)
Patient ID: Theresa [name], female    DOB: [birth year], 59 y.o.   MRN: [id number]  HPI Comments: [name] is a pleasant 59 -year old woman with 25 years of smoking, diabetes, chronic renal insufficiency,  previously noted to have moderate to severe mitral valve regurgitation on prior echocardiogram (improved to moderate MR on recent echo), pneumonia in June of 2012 with admission overnight to [facility] who initially presented by referral from Dr. Thedore Mins (renal) for evaluation of her ejection fraction which is 45% and mitral valve disease.  on dialysis since February 2014, 3 days per week. She has 40-59% bilateral carotid arterial disease as of September 2014  Drug-eluting stent placed December 2014 to the mid RCA  In follow-up today, she reports that she is doing well. Dry weight is 76.5 kg. Dialysis on Monday Wednesday and Friday Weight has been trending upwards, she has been eating more Denies any significant shortness of breath with exertion. No PND or orthopnea. Blood pressure has been running high. She's not been taking her clonidine. Repeat blood pressure today in the 180 systolic range.  EKG on today's visit shows normal sinus rhythm with rate 77 bpm, no significant ST or T-wave changes  Other past medical history evaluation in the hospital  05/12/2014 for fluid overload, hyperkalemia, shortness of breath, saturations 89% on room air, systolic blood pressure 214. She had dialysis with improvement of her symptoms. She does hemodialysis on Monday, Wednesday, Friday She was told that her potassium intake was too high.   Previously he would wake up with tachycardia.  sits up and symptoms typically resolve.   hospital admission for pneumonia 05/28/2013, elevated cardiac enzymes, transferred to [place] for cardiac catheterization. She had stent placed for 95% mid RCA lesion. She had 2.5 x 16 mm Promus stent placed, post dilated up to 2.78 mm . Since then she reports she is doing well with no  further chest pain or shortness of breath . She is on the renal transplant list but reports this will require her to be on the list for 5 years before transplant eligibility   Readmission to the hospital 06/14/2013 for C. difficile colitis. Discharge on 06/16/2013 C. difficile was indigent positive, toxin negative. She was treated with Flagyl for 10 days  Interestingly Stress test at [facility] August 2014 was essentially normal with normal ejection fraction (there was mention of very small mild region of artifact) Echocardiogram at [facility] July 2014 showed ejection fraction greater than 55%, mild MR  She sees Dr. Thedore Mins  of [place] [facility] .  previous  AV graft on 02/23/2013. Graft was thrombosed. Status post revision   Echocardiogram on November 10, 2010 shows mildly dilated left ventricle, ejection fraction 45-50%, mild MS, moderate to severe mitral regurg, mild to moderate tricuspid regurg.     Allergies  Allergen Reactions  . Metformin Diarrhea  . Hydrocodone Hives    Outpatient Encounter Prescriptions as of 01/30/2015  Medication Sig  . albuterol (PROVENTIL HFA;VENTOLIN HFA) 108 (90 BASE) MCG/ACT inhaler Inhale 2 puffs into the lungs every 6 (six) hours as needed for wheezing or shortness of breath.  Marland Kitchen aspirin EC 81 MG tablet Take 81 mg by mouth daily.  Marland Kitchen b complex-vitamin c-folic acid (NEPHRO-VITE) 0.8 MG TABS tablet Take 1 tablet by mouth daily.  Marland Kitchen BAYER CONTOUR TEST test strip TEST BLOOD SUGARS 2 TIMES DAILY  . Blood Glucose Monitoring Suppl (ONE TOUCH ULTRA SYSTEM KIT) W/DEVICE KIT Please dispense for patient to use at home to check  blood sugars. (Patient taking differently: 1 kit by Other route See admin instructions. Pt uses to test blood sugars.)  . calcium acetate (PHOSLO) 667 MG capsule Take 667-2,668 mg by mouth 5 (five) times daily. Pt takes four capsules with meals and two capsules with snacks.  . carvedilol (COREG) 12.5 MG tablet Take 1 tablet (12.5 mg total) by mouth 2 (two) times  daily.  . cloNIDine (CATAPRES) 0.1 MG tablet Take 1 tablet (0.1 mg total) by mouth 2 (two) times daily.  . Insulin Detemir (LEVEMIR) 100 UNIT/ML Pen Inject 5 Units into the skin daily.  . Lancets Thin MISC Using at home to test blood sugars twice daily. (Patient taking differently: 1 each by Other route 2 (two) times daily. Pt uses to test blood sugars.)  . mometasone (NASONEX) 50 MCG/ACT nasal spray Place 2 sprays into the nose 2 (two) times daily as needed (for allergies).  . nitroGLYCERIN (NITROSTAT) 0.4 MG SL tablet Place 1 tablet (0.4 mg total) under the tongue every 5 (five) minutes as needed for chest pain.  Marland Kitchen NOVOFINE 32G X 6 MM MISC USE AS DIRECTED  . pregabalin (LYRICA) 75 MG capsule Take 1 capsule (75 mg total) by mouth daily.  . rosuvastatin (CRESTOR) 40 MG tablet Take 1 tablet (40 mg total) by mouth daily.  . [DISCONTINUED] cloNIDine (CATAPRES) 0.1 MG tablet Take 0.1 mg by mouth daily.   No facility-administered encounter medications on file as of 01/30/2015.    Past Medical History  Diagnosis Date  . Hypertension   . ESRD on hemodialysis     a. [facility] in [place], [place], on a TTS schedule.  She started dialysis in Feb 2014.  Etiology of renal failure not known, likely diabetes.  Has a left upper arm AV graft.  Marland Kitchen COPD (chronic obstructive pulmonary disease)   . Emphysema   . History of pneumonia     June 2012  . History of bronchitis     Mar 2012  . Peripheral vascular disease   . Chronic constipation   . Colon polyps   . Sickle cell trait   . Anemia of chronic disease   . Diabetes mellitus   . Moderate mitral insufficiency     a. 10/2013 Echo: EF 55-60%, mod MR.  . Diabetic neuropathy   . Diabetic retinopathy 05/28/2013    Hx bilat retinal detachment, proliferative diab retinopathy and bilat vitreous hemorrhage   . Hyperlipidemia   . Coronary artery disease     a. 05/2013 NSTEMI/PCI: LM 20d, LAD min irregs, LCX small, nl, OM1 nl, RCA dom 11m (2.5x16 Promus DES), PDA1  80p.  . Chronic diastolic CHF (congestive heart failure)     a. 10/2013 Echo [facility]): EF 55-60%, mod conc LVH, mod MR, mildly dil LA, mild Ao sclerosis w/o stenosis.  . Carotid arterial disease     a. 02/2013 U/S: 40-59% bilat ICA stenosis - *f/u 02/2014*  . History of tobacco abuse     a. Quit 2012.    Past Surgical History  Procedure Laterality Date  . Abdominal hysterectomy      2000  . Dilation and curettage of uterus      several in the early 80's  . Eye surgery      bilateral laser 2012  . Esophagogastroduodenoscopy      2012  . Colonscopy    . Tubal ligation      1979  . Pars plana vitrectomy  04/22/2011    Procedure: PARS PLANA VITRECTOMY WITH 25  GAUGE;  Surgeon: Sherrie George, MD;  Location: [facility] OR;  Service: Ophthalmology;  Laterality: Left;  membrane peel, endolaser, gas fluid exchange, silicone oil, repair of complex traction retinal detachment  . Pars plana vitrectomy  09/30/2011    Procedure: PARS PLANA VITRECTOMY WITH 25 GAUGE;  Surgeon: Sherrie George, MD;  Location[facility] OR;  Service: Ophthalmology;  Laterality: Right;  Endolaser; Repair of Complex Traction Retinal Detachment  . Gas/fluid exchange  09/30/2011    Procedure: GAS/FLUID EXCHANGE;  Surgeon: Sherrie George, MD;  Location: [facility] OR;  Service: Ophthalmology;  Laterality: Right;  . Gas insertion  09/30/2011    Procedure: INSERTION OF GAS;  Surgeon: Sherrie George, MD;  Location: [facility] OR;  Service: Ophthalmology;  Laterality: Right;  [id number]  . Eye surgery      right  . Pars plana vitrectomy  02/24/2012    Procedure: PARS PLANA VITRECTOMY WITH 25 GAUGE;  Surgeon: Sherrie George, MD;  Location: [facility] OR;  Service: Ophthalmology;  Laterality: Left;  . Silicon oil removal  02/24/2012    Procedure: SILICON OIL REMOVAL;  Surgeon: Sherrie George, MD;  Location[facility] OR;  Service: Ophthalmology;  Laterality: Left;  . Ptca    . Thrombectomy / arteriovenous graft revision    . Cardiac catheterization    . Coronary angioplasty   05/28/2014    stent placement to the mid RCA  . Left heart catheterization with coronary angiogram N/A 05/28/2013    Procedure: LEFT HEART CATHETERIZATION WITH CORONARY ANGIOGRAM;  Surgeon: Corky Crafts, MD;  Location: [facility] CATH LAB;  Service: Cardiovascular;  Laterality: N/A;    Social History  reports that she quit smoking about 4 years ago. She has never used smokeless tobacco. She reports that she does not drink alcohol or use illicit drugs.  Family History family history includes Breast cancer in her sister; Colon cancer in her father and sister; Diabetes in her brother, brother, and daughter; Heart attack in her brother and mother; Heart disease in her mother; Stroke in her brother and mother. There is no history of Anesthesia problems, Hypotension, Malignant hyperthermia, or Pseudochol deficiency.   Review of Systems  Constitutional: Negative.   Respiratory: Negative.   Cardiovascular: Negative.   Gastrointestinal: Negative.   Musculoskeletal: Negative.   Skin: Negative.   Neurological: Negative.   Hematological: Negative.   Psychiatric/Behavioral: Negative.   All other systems reviewed and are negative.  BP 188/70 mmHg  Pulse 77  Ht 5' 7.5" (1.715 m)  Wt 167 lb 4 oz (75.864 kg)  BMI 25.79 kg/m2  Physical Exam  Constitutional: She is oriented to person, place, and time. She appears well-developed and well-nourished.  AV graft on her left arm  HENT:  Head: Normocephalic.  Nose: Nose normal.  Mouth/Throat: Oropharynx is clear and moist.  Eyes: Conjunctivae are normal. Pupils are equal, round, and reactive to light.  Neck: Normal range of motion. Neck supple. No JVD present. Carotid bruit is present.  Cardiovascular: Normal rate, regular rhythm, S1 normal, S2 normal and intact distal pulses.  Exam reveals no gallop and no friction rub.   Murmur heard.  Crescendo systolic murmur is present with a grade of 2/6  Pulmonary/Chest: Effort normal and breath sounds normal.  No respiratory distress. She has no wheezes. She has no rales. She exhibits no tenderness.  Abdominal: Soft. Bowel sounds are normal. She exhibits no distension. There is no tenderness.  Musculoskeletal: Normal range of motion. She exhibits no edema or tenderness.  Lymphadenopathy:    She has no cervical adenopathy.  Neurological: She is alert and oriented to person, place, and time. Coordination normal.  Skin: Skin is warm and dry. No rash noted. No erythema.  Psychiatric: She has a normal mood and affect. Her behavior is normal. Judgment and thought content normal.    Assessment and Plan  Nursing note and vitals reviewed.

## 2015-01-31 DIAGNOSIS — D631 Anemia in chronic kidney disease: Secondary | ICD-10-CM | POA: Diagnosis not present

## 2015-01-31 DIAGNOSIS — N186 End stage renal disease: Secondary | ICD-10-CM | POA: Diagnosis not present

## 2015-01-31 DIAGNOSIS — Z992 Dependence on renal dialysis: Secondary | ICD-10-CM | POA: Diagnosis not present

## 2015-01-31 DIAGNOSIS — D509 Iron deficiency anemia, unspecified: Secondary | ICD-10-CM | POA: Diagnosis not present

## 2015-01-31 DIAGNOSIS — N2581 Secondary hyperparathyroidism of renal origin: Secondary | ICD-10-CM | POA: Diagnosis not present

## 2015-02-02 DIAGNOSIS — Z992 Dependence on renal dialysis: Secondary | ICD-10-CM | POA: Diagnosis not present

## 2015-02-02 DIAGNOSIS — D509 Iron deficiency anemia, unspecified: Secondary | ICD-10-CM | POA: Diagnosis not present

## 2015-02-02 DIAGNOSIS — N186 End stage renal disease: Secondary | ICD-10-CM | POA: Diagnosis not present

## 2015-02-02 DIAGNOSIS — D631 Anemia in chronic kidney disease: Secondary | ICD-10-CM | POA: Diagnosis not present

## 2015-02-02 DIAGNOSIS — N2581 Secondary hyperparathyroidism of renal origin: Secondary | ICD-10-CM | POA: Diagnosis not present

## 2015-02-05 DIAGNOSIS — Z992 Dependence on renal dialysis: Secondary | ICD-10-CM | POA: Diagnosis not present

## 2015-02-05 DIAGNOSIS — D631 Anemia in chronic kidney disease: Secondary | ICD-10-CM | POA: Diagnosis not present

## 2015-02-05 DIAGNOSIS — D509 Iron deficiency anemia, unspecified: Secondary | ICD-10-CM | POA: Diagnosis not present

## 2015-02-05 DIAGNOSIS — N2581 Secondary hyperparathyroidism of renal origin: Secondary | ICD-10-CM | POA: Diagnosis not present

## 2015-02-05 DIAGNOSIS — N186 End stage renal disease: Secondary | ICD-10-CM | POA: Diagnosis not present

## 2015-02-07 DIAGNOSIS — M5136 Other intervertebral disc degeneration, lumbar region: Secondary | ICD-10-CM | POA: Diagnosis not present

## 2015-02-07 DIAGNOSIS — N2581 Secondary hyperparathyroidism of renal origin: Secondary | ICD-10-CM | POA: Diagnosis not present

## 2015-02-07 DIAGNOSIS — Z992 Dependence on renal dialysis: Secondary | ICD-10-CM | POA: Diagnosis not present

## 2015-02-07 DIAGNOSIS — M5416 Radiculopathy, lumbar region: Secondary | ICD-10-CM | POA: Diagnosis not present

## 2015-02-07 DIAGNOSIS — N186 End stage renal disease: Secondary | ICD-10-CM | POA: Diagnosis not present

## 2015-02-07 DIAGNOSIS — D631 Anemia in chronic kidney disease: Secondary | ICD-10-CM | POA: Diagnosis not present

## 2015-02-07 DIAGNOSIS — D509 Iron deficiency anemia, unspecified: Secondary | ICD-10-CM | POA: Diagnosis not present

## 2015-02-07 NOTE — Patient Outreach (Signed)
[place] [facility]) Care Management  02/07/2015  HARGUN FIECHTNER 18-Dec-1955 [id number]  Assessment: Care Coordinator called member three times, but unable to reach. Outreach letter sent with no return response.  Plan: Close case due to unable to reach, send primary care a case closure letter and send notification to assistant of case closure.  [name], RN, MSN, [place] Coordinator Cell: [phone]

## 2015-02-08 DIAGNOSIS — N186 End stage renal disease: Secondary | ICD-10-CM | POA: Diagnosis not present

## 2015-02-08 DIAGNOSIS — Z992 Dependence on renal dialysis: Secondary | ICD-10-CM | POA: Diagnosis not present

## 2015-02-09 DIAGNOSIS — D509 Iron deficiency anemia, unspecified: Secondary | ICD-10-CM | POA: Diagnosis not present

## 2015-02-09 DIAGNOSIS — N186 End stage renal disease: Secondary | ICD-10-CM | POA: Diagnosis not present

## 2015-02-09 DIAGNOSIS — N2581 Secondary hyperparathyroidism of renal origin: Secondary | ICD-10-CM | POA: Diagnosis not present

## 2015-02-09 DIAGNOSIS — D631 Anemia in chronic kidney disease: Secondary | ICD-10-CM | POA: Diagnosis not present

## 2015-02-09 DIAGNOSIS — Z992 Dependence on renal dialysis: Secondary | ICD-10-CM | POA: Diagnosis not present

## 2015-02-09 DIAGNOSIS — Z23 Encounter for immunization: Secondary | ICD-10-CM | POA: Diagnosis not present

## 2015-02-12 DIAGNOSIS — Z23 Encounter for immunization: Secondary | ICD-10-CM | POA: Diagnosis not present

## 2015-02-12 DIAGNOSIS — N186 End stage renal disease: Secondary | ICD-10-CM | POA: Diagnosis not present

## 2015-02-12 DIAGNOSIS — D631 Anemia in chronic kidney disease: Secondary | ICD-10-CM | POA: Diagnosis not present

## 2015-02-12 DIAGNOSIS — N2581 Secondary hyperparathyroidism of renal origin: Secondary | ICD-10-CM | POA: Diagnosis not present

## 2015-02-12 DIAGNOSIS — D509 Iron deficiency anemia, unspecified: Secondary | ICD-10-CM | POA: Diagnosis not present

## 2015-02-12 DIAGNOSIS — Z992 Dependence on renal dialysis: Secondary | ICD-10-CM | POA: Diagnosis not present

## 2015-02-13 ENCOUNTER — Emergency Department: Payer: Medicare Other

## 2015-02-13 ENCOUNTER — Emergency Department
Admission: EM | Admit: 2015-02-13 | Discharge: 2015-02-13 | Disposition: A | Discharge status: Home / Self Care | Payer: Medicare Other | Attending: Emergency Medicine | Admitting: Emergency Medicine

## 2015-02-13 ENCOUNTER — Encounter: Payer: Self-pay | Admitting: Emergency Medicine

## 2015-02-13 DIAGNOSIS — Z79899 Other long term (current) drug therapy: Secondary | ICD-10-CM | POA: Insufficient documentation

## 2015-02-13 DIAGNOSIS — Z992 Dependence on renal dialysis: Secondary | ICD-10-CM | POA: Diagnosis not present

## 2015-02-13 DIAGNOSIS — Z87891 Personal history of nicotine dependence: Secondary | ICD-10-CM | POA: Diagnosis not present

## 2015-02-13 DIAGNOSIS — Z794 Long term (current) use of insulin: Secondary | ICD-10-CM | POA: Insufficient documentation

## 2015-02-13 DIAGNOSIS — Y998 Other external cause status: Secondary | ICD-10-CM | POA: Diagnosis not present

## 2015-02-13 DIAGNOSIS — Y9389 Activity, other specified: Secondary | ICD-10-CM | POA: Insufficient documentation

## 2015-02-13 DIAGNOSIS — S93601A Unspecified sprain of right foot, initial encounter: Secondary | ICD-10-CM | POA: Diagnosis not present

## 2015-02-13 DIAGNOSIS — W1839XA Other fall on same level, initial encounter: Secondary | ICD-10-CM | POA: Diagnosis not present

## 2015-02-13 DIAGNOSIS — Z7982 Long term (current) use of aspirin: Secondary | ICD-10-CM | POA: Insufficient documentation

## 2015-02-13 DIAGNOSIS — Y9289 Other specified places as the place of occurrence of the external cause: Secondary | ICD-10-CM | POA: Diagnosis not present

## 2015-02-13 DIAGNOSIS — E119 Type 2 diabetes mellitus without complications: Secondary | ICD-10-CM | POA: Diagnosis not present

## 2015-02-13 DIAGNOSIS — M79671 Pain in right foot: Secondary | ICD-10-CM | POA: Diagnosis not present

## 2015-02-13 DIAGNOSIS — S93401A Sprain of unspecified ligament of right ankle, initial encounter: Secondary | ICD-10-CM | POA: Diagnosis not present

## 2015-02-13 DIAGNOSIS — N186 End stage renal disease: Secondary | ICD-10-CM | POA: Diagnosis not present

## 2015-02-13 DIAGNOSIS — S99921A Unspecified injury of right foot, initial encounter: Secondary | ICD-10-CM | POA: Diagnosis present

## 2015-02-13 DIAGNOSIS — I120 Hypertensive chronic kidney disease with stage 5 chronic kidney disease or end stage renal disease: Secondary | ICD-10-CM | POA: Diagnosis not present

## 2015-02-13 MED ORDER — TRAMADOL HCL 50 MG PO TABS
50.0000 mg | ORAL_TABLET | Freq: Once | ORAL | Status: AC
  Administered 2015-02-13: 50 mg via ORAL
  Filled 2015-02-13: qty 1

## 2015-02-13 NOTE — ED Provider Notes (Signed)
[facility] Emergency Department Provider Note  ____________________________________________  Time seen: Approximately 1:30 PM  I have reviewed the triage vital signs and the nursing notes.   HISTORY  Chief Complaint Foot Pain    HPI Theresa [name] is a 59 y.o. female right foot pain secondary to a twisting incident causing her fall landing on the left knee. She states since the incident which occurred 4 days ago she is continued having pain and edema to the dorsal aspect of the right foot. Patient stated this been no improvement with over-the-counter anti-inflammatory medication and ice. Patient stated pain increases with ambulation since he rates it as a 6/10.Patient normally use a cane for ambulation.   Past Medical History  Diagnosis Date  . Hypertension   . ESRD on hemodialysis     a. [facility] in [place], [place], on a TTS schedule.  She started dialysis in Feb 2014.  Etiology of renal failure not known, likely diabetes.  Has a left upper arm AV graft.  Marland Kitchen COPD (chronic obstructive pulmonary disease)   . Emphysema   . History of pneumonia     June 2012  . History of bronchitis     Mar 2012  . Peripheral vascular disease   . Chronic constipation   . Colon polyps   . Sickle cell trait   . Anemia of chronic disease   . Diabetes mellitus   . Moderate mitral insufficiency     a. 10/2013 Echo: EF 55-60%, mod MR.  . Diabetic neuropathy   . Diabetic retinopathy 05/28/2013    Hx bilat retinal detachment, proliferative diab retinopathy and bilat vitreous hemorrhage   . Hyperlipidemia   . Coronary artery disease     a. 05/2013 NSTEMI/PCI: LM 20d, LAD min irregs, LCX small, nl, OM1 nl, RCA dom 48m(2.5x16 Promus DES), PDA1 80p.  . Chronic diastolic CHF (congestive heart failure)     a. 10/2013 Echo ([facility]: EF 55-60%, mod conc LVH, mod MR, mildly dil LA, mild Ao sclerosis w/o stenosis.  . Carotid arterial disease     a. 02/2013 U/S: 40-59% bilat ICA stenosis -  *f/u 02/2014*  . History of tobacco abuse     a. Quit 2012.    Patient Active Problem List   Diagnosis Date Noted  . Neuritis or radiculitis due to rupture of lumbar intervertebral disc 11/28/2014  . Acute CVA (cerebrovascular accident) 11/23/2014  . HTN (hypertension), malignant 11/23/2014  . Right hip pain 06/13/2014  . Other malaise and fatigue 02/21/2014  . Anemia, unspecified 02/02/2014  . Ingrown thumb nail, right 02/02/2014  . Tachycardia 01/26/2014  . Nausea with vomiting 10/25/2013  . Routine general medical examination at a health care facility 09/02/2013  . Osteoarthritis of right hip 08/23/2013  . DDD (degenerative disc disease), lumbosacral 08/23/2013  . Coronary artery disease 06/27/2013  . NSTEMI (non-ST elevated myocardial infarction) 05/30/2013  . Diabetic retinopathy 05/28/2013  . Hyperlipidemia   . End stage kidney disease 11/08/2012  . HLD (hyperlipidemia) 11/08/2012  . Essential (primary) hypertension 11/08/2012  . Diabetic neuropathy 02/18/2012  . Anemia in chronic kidney disease (CKD) 02/18/2012  . ESRD on hemodialysis 08/21/2011  . Diabetes mellitus, type II 06/30/2011  . COPD (chronic obstructive pulmonary disease) 06/13/2011    Past Surgical History  Procedure Laterality Date  . Abdominal hysterectomy      2000  . Dilation and curettage of uterus      several in the early 80's  . Eye surgery  bilateral laser 2012  . Esophagogastroduodenoscopy      2012  . Colonscopy    . Tubal ligation      1979  . Pars plana vitrectomy  04/22/2011    Procedure: PARS PLANA VITRECTOMY WITH 25 GAUGE;  Surgeon: [name], MD;  Location: [place];  Service: Ophthalmology;  Laterality: Left;  membrane peel, endolaser, gas fluid exchange, silicone oil, repair of complex traction retinal detachment  . Pars plana vitrectomy  09/30/2011    Procedure: PARS PLANA VITRECTOMY WITH 25 GAUGE;  Surgeon: [name], MD;  Location: [place];  Service: Ophthalmology;   Laterality: Right;  Endolaser; Repair of Complex Traction Retinal Detachment  . Gas/fluid exchange  09/30/2011    Procedure: GAS/FLUID EXCHANGE;  Surgeon: [name], MD;  Location: [place];  Service: Ophthalmology;  Laterality: Right;  . Gas insertion  09/30/2011    Procedure: INSERTION OF GAS;  Surgeon: [name], MD;  Location: [place];  Service: Ophthalmology;  Laterality: Right;  C3F8  . Eye surgery      right  . Pars plana vitrectomy  02/24/2012    Procedure: PARS PLANA VITRECTOMY WITH 25 GAUGE;  Surgeon: [name], MD;  Location: [place];  Service: Ophthalmology;  Laterality: Left;  . Silicon oil removal  3/82/5053    Procedure: SILICON OIL REMOVAL;  Surgeon: [name], MD;  Location: [place];  Service: Ophthalmology;  Laterality: Left;  . Ptca    . Thrombectomy / arteriovenous graft revision    . Cardiac catheterization    . Coronary angioplasty  05/28/2014    stent placement to the mid RCA  . Left heart catheterization with coronary angiogram N/A 05/28/2013    Procedure: LEFT HEART CATHETERIZATION WITH CORONARY ANGIOGRAM;  Surgeon: [name], MD;  Location: [facility] CATH LAB;  Service: Cardiovascular;  Laterality: N/A;    Current Outpatient Rx  Name  Route  Sig  Dispense  Refill  . albuterol (PROVENTIL HFA;VENTOLIN HFA) 108 (90 BASE) MCG/ACT inhaler   Inhalation   Inhale 2 puffs into the lungs every 6 (six) hours as needed for wheezing or shortness of breath.         Marland Kitchen aspirin EC 81 MG tablet   Oral   Take 81 mg by mouth daily.         Marland Kitchen b complex-vitamin c-folic acid (NEPHRO-VITE) 0.8 MG TABS tablet   Oral   Take 1 tablet by mouth daily.         Marland Kitchen BAYER CONTOUR TEST test strip      TEST BLOOD SUGARS 2 TIMES DAILY   100 each   PRN   . Blood Glucose Monitoring Suppl (ONE TOUCH ULTRA SYSTEM KIT) W/DEVICE KIT      Please dispense for patient to use at home to check blood sugars. Patient taking differently: 1 kit by Other route See admin  instructions. Pt uses to test blood sugars.   1 each   0   . calcium acetate (PHOSLO) 667 MG capsule   Oral   Take 667-2,668 mg by mouth 5 (five) times daily. Pt takes four capsules with meals and two capsules with snacks.         . carvedilol (COREG) 12.5 MG tablet   Oral   Take 1 tablet (12.5 mg total) by mouth 2 (two) times daily.   60 tablet   6   . cloNIDine (CATAPRES) 0.1 MG tablet   Oral   Take 1  tablet (0.1 mg total) by mouth 2 (two) times daily.   60 tablet   11   . Insulin Detemir (LEVEMIR) 100 UNIT/ML Pen   Subcutaneous   Inject 5 Units into the skin daily.   15 mL   3   . Lancets Thin MISC      Using at home to test blood sugars twice daily. Patient taking differently: 1 each by Other route 2 (two) times daily. Pt uses to test blood sugars.   200 each   6   . mometasone (NASONEX) 50 MCG/ACT nasal spray   Nasal   Place 2 sprays into the nose 2 (two) times daily as needed (for allergies).         . nitroGLYCERIN (NITROSTAT) 0.4 MG SL tablet   Sublingual   Place 1 tablet (0.4 mg total) under the tongue every 5 (five) minutes as needed for chest pain.   25 tablet   3   . NOVOFINE 32G X 6 MM MISC      USE AS DIRECTED   100 each   0   . pregabalin (LYRICA) 75 MG capsule   Oral   Take 1 capsule (75 mg total) by mouth daily.   30 capsule   3   . rosuvastatin (CRESTOR) 40 MG tablet   Oral   Take 1 tablet (40 mg total) by mouth daily.   90 tablet   3     Allergies Metformin and Hydrocodone  Family History  Problem Relation Age of Onset  . Anesthesia problems Neg Hx   . Hypotension Neg Hx   . Malignant hyperthermia Neg Hx   . Pseudochol deficiency Neg Hx   . Stroke Mother   . Heart attack Mother   . Heart disease Mother   . Colon cancer Father   . Colon cancer Sister   . Heart attack Brother   . Stroke Brother   . Diabetes Brother   . Breast cancer Sister   . Diabetes Brother   . Diabetes Daughter     Social History Social  History  Substance Use Topics  . Smoking status: Former Smoker -- 1.00 packs/day for 25 years    Quit date: 06/09/2010  . Smokeless tobacco: Never Used  . Alcohol Use: No    Review of Systems Constitutional: No fever/chills Eyes: No visual changes. ENT: No sore throat. Cardiovascular: Denies chest pain. Respiratory: Denies shortness of breath. Gastrointestinal: No abdominal pain.  No nausea, no vomiting.  No diarrhea.  No constipation. Genitourinary: Negative for dysuria. Musculoskeletal: Right foot pain and edema Skin: Negative for rash. Neurological: Negative for headaches, focal weakness or numbness. Allergic/Immunilogical: Metformin and hydrocodone 10-point ROS otherwise negative.  ____________________________________________   PHYSICAL EXAM:  VITAL SIGNS: ED Triage Vitals  Enc Vitals Group     BP --      Pulse --      Resp --      Temp --      Temp src --      SpO2 --      Weight --      Height --      Head Cir --      Peak Flow --      Pain Score --      Pain Loc --      Pain Edu? --      Excl. in [place]? --     Constitutional: Alert and oriented. Well appearing and in no acute distress. Eyes: Conjunctivae  are normal. PERRL. EOMI. Head: Atraumatic. Nose: No congestion/rhinnorhea. Mouth/Throat: Mucous membranes are moist.  Oropharynx non-erythematous. Neck: No stridor. No cervical spine tenderness to palpation. Hematological/Lymphatic/Immunilogical: No cervical lymphadenopathy. Cardiovascular: Normal rate, regular rhythm. Grossly normal heart sounds.  Good peripheral circulation. Respiratory: Normal respiratory effort.  No retractions. Lungs CTAB. Gastrointestinal: Soft and nontender. No distention. No abdominal bruits. No CVA tenderness. Genitourinary:  Musculoskeletal: No deformity to the right foot. Mild edema. Neurovascular intact free and equal range of motion. Neurologic:  Normal speech and language. No gross focal neurologic deficits are appreciated.  No gait instability. Skin:  Skin is warm, dry and intact. No rash noted. Psychiatric: Mood and affect are normal. Speech and behavior are normal.  ____________________________________________   LABS (all labs ordered are listed, but only abnormal results are displayed)  Labs Reviewed - No data to display ____________________________________________  EKG   ____________________________________________  RADIOLOGY  No acute findings on x-ray. I, Sable Feil, personally viewed and evaluated these images (plain radiographs) as part of my medical decision making.   ____________________________________________   PROCEDURES  Procedure(s) performed: None  Critical Care performed: No  ____________________________________________   INITIAL IMPRESSION / ASSESSMENT AND PLAN / ED COURSE  Pertinent labs & imaging results that were available during my care of the patient were reviewed by me and considered in my medical decision making (see chart for details).  Right ankle and foot sprain. Discussed negative x-ray report with patient. Patient is placed in ankle stirrup splint and advised to continue ambulating with a cane. ____________________________________________   FINAL CLINICAL IMPRESSION(S) / ED DIAGNOSES  Final diagnoses:  Right ankle sprain, initial encounter  Right foot sprain, initial encounter      Sable Feil, PA-C 02/13/15 [place], PA-C 02/13/15 [place], MD 02/13/15 [id number]

## 2015-02-13 NOTE — Discharge Instructions (Signed)
Ankle Sprain  An ankle sprain is an injury to the strong, fibrous tissues (ligaments) that hold your ankle bones together.   HOME CARE   · Put ice on your ankle for 1-2 days or as told by your doctor.  ¨ Put ice in a plastic bag.  ¨ Place a towel between your skin and the bag.  ¨ Leave the ice on for 15-20 minutes at a time, every 2 hours while you are awake.  · Only take medicine as told by your doctor.  · Raise (elevate) your injured ankle above the level of your heart as much as possible for 2-3 days.  · Use crutches if your doctor tells you to. Slowly put your own weight on the affected ankle. Use the crutches until you can walk without pain.  · If you have a plaster splint:  ¨ Do not rest it on anything harder than a pillow for 24 hours.  ¨ Do not put weight on it.  ¨ Do not get it wet.  ¨ Take it off to shower or bathe.  · If given, use an elastic wrap or support stocking for support. Take the wrap off if your toes lose feeling (numb), tingle, or turn cold or blue.  · If you have an air splint:  ¨ Add or let out air to make it comfortable.  ¨ Take it off at night and to shower and bathe.  ¨ Wiggle your toes and move your ankle up and down often while you are wearing it.  GET HELP IF:  · You have rapidly increasing bruising or puffiness (swelling).  · Your toes feel very cold.  · You lose feeling in your foot.  · Your medicine does not help your pain.  GET HELP RIGHT AWAY IF:   · Your toes lose feeling (numb) or turn blue.  · You have severe pain that is increasing.  MAKE SURE YOU:   · Understand these instructions.  · Will watch your condition.  · Will get help right away if you are not doing well or get worse.  Document Released: 11/12/2007 Document Revised: 10/10/2013 Document Reviewed: 12/08/2011  ExitCare® Patient Information ©2015 [employer]. This information is not intended to replace advice given to you by your health care provider. Make sure you discuss any questions you have with your health care  provider.

## 2015-02-13 NOTE — ED Notes (Signed)
Pt states she fell on Friday and twisted right ankle. Pt has swelling to ankle and foot.

## 2015-02-14 DIAGNOSIS — N186 End stage renal disease: Secondary | ICD-10-CM | POA: Diagnosis not present

## 2015-02-14 DIAGNOSIS — Z23 Encounter for immunization: Secondary | ICD-10-CM | POA: Diagnosis not present

## 2015-02-14 DIAGNOSIS — D631 Anemia in chronic kidney disease: Secondary | ICD-10-CM | POA: Diagnosis not present

## 2015-02-14 DIAGNOSIS — N2581 Secondary hyperparathyroidism of renal origin: Secondary | ICD-10-CM | POA: Diagnosis not present

## 2015-02-14 DIAGNOSIS — D509 Iron deficiency anemia, unspecified: Secondary | ICD-10-CM | POA: Diagnosis not present

## 2015-02-14 DIAGNOSIS — Z992 Dependence on renal dialysis: Secondary | ICD-10-CM | POA: Diagnosis not present

## 2015-02-16 DIAGNOSIS — Z992 Dependence on renal dialysis: Secondary | ICD-10-CM | POA: Diagnosis not present

## 2015-02-16 DIAGNOSIS — D631 Anemia in chronic kidney disease: Secondary | ICD-10-CM | POA: Diagnosis not present

## 2015-02-16 DIAGNOSIS — N186 End stage renal disease: Secondary | ICD-10-CM | POA: Diagnosis not present

## 2015-02-16 DIAGNOSIS — D509 Iron deficiency anemia, unspecified: Secondary | ICD-10-CM | POA: Diagnosis not present

## 2015-02-16 DIAGNOSIS — N2581 Secondary hyperparathyroidism of renal origin: Secondary | ICD-10-CM | POA: Diagnosis not present

## 2015-02-16 DIAGNOSIS — Z23 Encounter for immunization: Secondary | ICD-10-CM | POA: Diagnosis not present

## 2015-02-19 DIAGNOSIS — D631 Anemia in chronic kidney disease: Secondary | ICD-10-CM | POA: Diagnosis not present

## 2015-02-19 DIAGNOSIS — N186 End stage renal disease: Secondary | ICD-10-CM | POA: Diagnosis not present

## 2015-02-19 DIAGNOSIS — N2581 Secondary hyperparathyroidism of renal origin: Secondary | ICD-10-CM | POA: Diagnosis not present

## 2015-02-19 DIAGNOSIS — Z23 Encounter for immunization: Secondary | ICD-10-CM | POA: Diagnosis not present

## 2015-02-19 DIAGNOSIS — D509 Iron deficiency anemia, unspecified: Secondary | ICD-10-CM | POA: Diagnosis not present

## 2015-02-19 DIAGNOSIS — Z992 Dependence on renal dialysis: Secondary | ICD-10-CM | POA: Diagnosis not present

## 2015-02-21 DIAGNOSIS — N2581 Secondary hyperparathyroidism of renal origin: Secondary | ICD-10-CM | POA: Diagnosis not present

## 2015-02-21 DIAGNOSIS — D509 Iron deficiency anemia, unspecified: Secondary | ICD-10-CM | POA: Diagnosis not present

## 2015-02-21 DIAGNOSIS — D631 Anemia in chronic kidney disease: Secondary | ICD-10-CM | POA: Diagnosis not present

## 2015-02-21 DIAGNOSIS — Z992 Dependence on renal dialysis: Secondary | ICD-10-CM | POA: Diagnosis not present

## 2015-02-21 DIAGNOSIS — Z23 Encounter for immunization: Secondary | ICD-10-CM | POA: Diagnosis not present

## 2015-02-21 DIAGNOSIS — N186 End stage renal disease: Secondary | ICD-10-CM | POA: Diagnosis not present

## 2015-02-23 DIAGNOSIS — N2581 Secondary hyperparathyroidism of renal origin: Secondary | ICD-10-CM | POA: Diagnosis not present

## 2015-02-23 DIAGNOSIS — Z992 Dependence on renal dialysis: Secondary | ICD-10-CM | POA: Diagnosis not present

## 2015-02-23 DIAGNOSIS — Z23 Encounter for immunization: Secondary | ICD-10-CM | POA: Diagnosis not present

## 2015-02-23 DIAGNOSIS — D631 Anemia in chronic kidney disease: Secondary | ICD-10-CM | POA: Diagnosis not present

## 2015-02-23 DIAGNOSIS — D509 Iron deficiency anemia, unspecified: Secondary | ICD-10-CM | POA: Diagnosis not present

## 2015-02-23 DIAGNOSIS — N186 End stage renal disease: Secondary | ICD-10-CM | POA: Diagnosis not present

## 2015-02-26 DIAGNOSIS — N2581 Secondary hyperparathyroidism of renal origin: Secondary | ICD-10-CM | POA: Diagnosis not present

## 2015-02-26 DIAGNOSIS — N186 End stage renal disease: Secondary | ICD-10-CM | POA: Diagnosis not present

## 2015-02-26 DIAGNOSIS — Z992 Dependence on renal dialysis: Secondary | ICD-10-CM | POA: Diagnosis not present

## 2015-02-26 DIAGNOSIS — Z23 Encounter for immunization: Secondary | ICD-10-CM | POA: Diagnosis not present

## 2015-02-26 DIAGNOSIS — D631 Anemia in chronic kidney disease: Secondary | ICD-10-CM | POA: Diagnosis not present

## 2015-02-26 DIAGNOSIS — D509 Iron deficiency anemia, unspecified: Secondary | ICD-10-CM | POA: Diagnosis not present

## 2015-02-27 DIAGNOSIS — E11319 Type 2 diabetes mellitus with unspecified diabetic retinopathy without macular edema: Secondary | ICD-10-CM | POA: Diagnosis not present

## 2015-02-28 DIAGNOSIS — N2581 Secondary hyperparathyroidism of renal origin: Secondary | ICD-10-CM | POA: Diagnosis not present

## 2015-02-28 DIAGNOSIS — N186 End stage renal disease: Secondary | ICD-10-CM | POA: Diagnosis not present

## 2015-02-28 DIAGNOSIS — D631 Anemia in chronic kidney disease: Secondary | ICD-10-CM | POA: Diagnosis not present

## 2015-02-28 DIAGNOSIS — Z992 Dependence on renal dialysis: Secondary | ICD-10-CM | POA: Diagnosis not present

## 2015-02-28 DIAGNOSIS — D509 Iron deficiency anemia, unspecified: Secondary | ICD-10-CM | POA: Diagnosis not present

## 2015-02-28 DIAGNOSIS — Z23 Encounter for immunization: Secondary | ICD-10-CM | POA: Diagnosis not present

## 2015-03-02 DIAGNOSIS — N2581 Secondary hyperparathyroidism of renal origin: Secondary | ICD-10-CM | POA: Diagnosis not present

## 2015-03-02 DIAGNOSIS — Z992 Dependence on renal dialysis: Secondary | ICD-10-CM | POA: Diagnosis not present

## 2015-03-02 DIAGNOSIS — D631 Anemia in chronic kidney disease: Secondary | ICD-10-CM | POA: Diagnosis not present

## 2015-03-02 DIAGNOSIS — N186 End stage renal disease: Secondary | ICD-10-CM | POA: Diagnosis not present

## 2015-03-02 DIAGNOSIS — Z23 Encounter for immunization: Secondary | ICD-10-CM | POA: Diagnosis not present

## 2015-03-02 DIAGNOSIS — D509 Iron deficiency anemia, unspecified: Secondary | ICD-10-CM | POA: Diagnosis not present

## 2015-03-05 ENCOUNTER — Ambulatory Visit (INDEPENDENT_AMBULATORY_CARE_PROVIDER_SITE_OTHER): Payer: Medicare Other | Admitting: Internal Medicine

## 2015-03-05 ENCOUNTER — Encounter: Payer: Self-pay | Admitting: Internal Medicine

## 2015-03-05 VITALS — BP 202/64 | HR 75 | Temp 98.3°F | Ht 67.5 in | Wt 170.0 lb

## 2015-03-05 DIAGNOSIS — Z992 Dependence on renal dialysis: Secondary | ICD-10-CM

## 2015-03-05 DIAGNOSIS — I639 Cerebral infarction, unspecified: Secondary | ICD-10-CM | POA: Diagnosis not present

## 2015-03-05 DIAGNOSIS — N186 End stage renal disease: Secondary | ICD-10-CM | POA: Diagnosis not present

## 2015-03-05 DIAGNOSIS — D631 Anemia in chronic kidney disease: Secondary | ICD-10-CM | POA: Diagnosis not present

## 2015-03-05 DIAGNOSIS — I10 Essential (primary) hypertension: Secondary | ICD-10-CM | POA: Diagnosis not present

## 2015-03-05 DIAGNOSIS — M25571 Pain in right ankle and joints of right foot: Secondary | ICD-10-CM

## 2015-03-05 DIAGNOSIS — D509 Iron deficiency anemia, unspecified: Secondary | ICD-10-CM | POA: Diagnosis not present

## 2015-03-05 DIAGNOSIS — Z23 Encounter for immunization: Secondary | ICD-10-CM | POA: Diagnosis not present

## 2015-03-05 DIAGNOSIS — N2581 Secondary hyperparathyroidism of renal origin: Secondary | ICD-10-CM | POA: Diagnosis not present

## 2015-03-05 DIAGNOSIS — M79606 Pain in leg, unspecified: Secondary | ICD-10-CM | POA: Insufficient documentation

## 2015-03-05 MED ORDER — OXYCODONE-ACETAMINOPHEN 5-325 MG PO TABS: 1.0000 | ORAL_TABLET | Freq: Three times a day (TID) | ORAL | Status: DC | PRN

## 2015-03-05 NOTE — Assessment & Plan Note (Signed)
Right ankle pain after fall. Ankle is tender over bilateral malleoli. Will repeat xray. Referral to ortho. Percocet for severe pain only. Discussed risks of this medication. Follow up 2 weeks and prn.

## 2015-03-05 NOTE — Progress Notes (Signed)
Subjective:    Patient ID: Theresa [name], female    DOB: 1956-01-20, 59 y.o.   MRN: [id number]  HPI  59YO female presents for follow up.  Right ankle pain - Fell at [facility]. Seen in the ED 9/6. Plain xray was normal. Pain occurs with walking. Swells with any weight bearing. Not taking anything for pain. Given Tramadol in ED with no improvement. Wearing brace with no improvement.  HTN - BP has been well controlled by her report after HD. No CP, HA, palpitations. Compliant with medication.   Wt Readings from Last 3 Encounters:  03/05/15 170 lb (77.111 kg)  02/13/15 168 lb (76.204 kg)  01/30/15 167 lb 4 oz (75.864 kg)   BP Readings from Last 3 Encounters:  03/05/15 202/64  02/13/15 138/47  01/30/15 188/70    Past Medical History  Diagnosis Date  . Hypertension   . ESRD on hemodialysis     a. [facility] in [place], [place], on a TTS schedule.  She started dialysis in Feb 2014.  Etiology of renal failure not known, likely diabetes.  Has a left upper arm AV graft.  Marland Kitchen COPD (chronic obstructive pulmonary disease)   . Emphysema   . History of pneumonia     June 2012  . History of bronchitis     Mar 2012  . Peripheral vascular disease   . Chronic constipation   . Colon polyps   . Sickle cell trait   . Anemia of chronic disease   . Diabetes mellitus   . Moderate mitral insufficiency     a. 10/2013 Echo: EF 55-60%, mod MR.  . Diabetic neuropathy   . Diabetic retinopathy 05/28/2013    Hx bilat retinal detachment, proliferative diab retinopathy and bilat vitreous hemorrhage   . Hyperlipidemia   . Coronary artery disease     a. 05/2013 NSTEMI/PCI: LM 20d, LAD min irregs, LCX small, nl, OM1 nl, RCA dom 17m (2.5x16 Promus DES), PDA1 80p.  . Chronic diastolic CHF (congestive heart failure)     a. 10/2013 Echo [facility]): EF 55-60%, mod conc LVH, mod MR, mildly dil LA, mild Ao sclerosis w/o stenosis.  . Carotid arterial disease     a. 02/2013 U/S: 40-59% bilat ICA stenosis - *f/u 02/2014*  .  History of tobacco abuse     a. Quit 2012.   Family History  Problem Relation Age of Onset  . Anesthesia problems Neg Hx   . Hypotension Neg Hx   . Malignant hyperthermia Neg Hx   . Pseudochol deficiency Neg Hx   . Stroke Mother   . Heart attack Mother   . Heart disease Mother   . Colon cancer Father   . Colon cancer Sister   . Heart attack Brother   . Stroke Brother   . Diabetes Brother   . Breast cancer Sister   . Diabetes Brother   . Diabetes Daughter    Past Surgical History  Procedure Laterality Date  . Abdominal hysterectomy      2000  . Dilation and curettage of uterus      several in the early 80's  . Eye surgery      bilateral laser 2012  . Esophagogastroduodenoscopy      2012  . Colonscopy    . Tubal ligation      1979  . Pars plana vitrectomy  04/22/2011    Procedure: PARS PLANA VITRECTOMY WITH 25 GAUGE;  Surgeon: [name], MD;  Location: [place];  Service: Ophthalmology;  Laterality: Left;  membrane peel, endolaser, gas fluid exchange, silicone oil, repair of complex traction retinal detachment  . Pars plana vitrectomy  09/30/2011    Procedure: PARS PLANA VITRECTOMY WITH 25 GAUGE;  Surgeon: [name], MD;  Location: [place];  Service: Ophthalmology;  Laterality: Right;  Endolaser; Repair of Complex Traction Retinal Detachment  . Gas/fluid exchange  09/30/2011    Procedure: GAS/FLUID EXCHANGE;  Surgeon: [name], MD;  Location: [place];  Service: Ophthalmology;  Laterality: Right;  . Gas insertion  09/30/2011    Procedure: INSERTION OF GAS;  Surgeon: [name], MD;  Location: [place];  Service: Ophthalmology;  Laterality: Right;  C3F8  . Eye surgery      right  . Pars plana vitrectomy  02/24/2012    Procedure: PARS PLANA VITRECTOMY WITH 25 GAUGE;  Surgeon: [name], MD;  Location: [place];  Service: Ophthalmology;  Laterality: Left;  . Silicon oil removal  [id number]    Procedure: SILICON OIL REMOVAL;  Surgeon: [name], MD;   Location: [place];  Service: Ophthalmology;  Laterality: Left;  . Ptca    . Thrombectomy / arteriovenous graft revision    . Cardiac catheterization    . Coronary angioplasty  05/28/2014    stent placement to the mid RCA  . Left heart catheterization with coronary angiogram N/A 05/28/2013    Procedure: LEFT HEART CATHETERIZATION WITH CORONARY ANGIOGRAM;  Surgeon: [name], MD;  Location: [facility] CATH LAB;  Service: Cardiovascular;  Laterality: N/A;   Social History   Social History  . Marital Status: Divorced    Spouse Name: N/A  . Number of Children: 2  . Years of Education: N/A   Occupational History  . [place] History Main Topics  . Smoking status: Former Smoker -- 1.00 packs/day for 25 years    Quit date: 06/09/2010  . Smokeless tobacco: Never Used  . Alcohol Use: No  . Drug Use: No  . Sexual Activity: No   Other Topics Concern  . None   Social History Narrative    Review of Systems  Constitutional: Negative for fever, chills, appetite change, fatigue and unexpected weight change.  Eyes: Negative for visual disturbance.  Respiratory: Negative for cough and shortness of breath.   Cardiovascular: Negative for chest pain, palpitations and leg swelling.  Gastrointestinal: Negative for nausea, vomiting, abdominal pain, diarrhea and constipation.  Musculoskeletal: Positive for myalgias, joint swelling and arthralgias.  Skin: Negative for color change and rash.  Hematological: Negative for adenopathy. Does not bruise/bleed easily.  Psychiatric/Behavioral: Negative for sleep disturbance and dysphoric mood. The patient is not nervous/anxious.        Objective:    BP 202/64 mmHg  Pulse 75  Temp(Src) 98.3 F (36.8 C) (Oral)  Ht 5' 7.5" (1.715 m)  Wt 170 lb (77.111 kg)  BMI 26.22 kg/m2  SpO2 98% Physical Exam  Constitutional: She is oriented to person, place, and time. She appears well-developed and well-nourished. No distress.  HENT:    Head: Normocephalic and atraumatic.  Right Ear: External ear normal.  Left Ear: External ear normal.  Nose: Nose normal.  Mouth/Throat: Oropharynx is clear and moist. No oropharyngeal exudate.  Eyes: Conjunctivae are normal. Pupils are equal, round, and reactive to light. Right eye exhibits no discharge. Left eye exhibits no discharge. No scleral icterus.  Neck: Normal range of motion. Neck supple. No tracheal deviation present. No thyromegaly present.  Cardiovascular: Normal rate, regular rhythm, normal heart sounds and intact distal pulses.  Exam reveals no gallop and no friction rub.   No murmur heard. Pulmonary/Chest: Effort normal and breath sounds normal. No respiratory distress. She has no wheezes. She has no rales. She exhibits no tenderness.  Musculoskeletal: Normal range of motion. She exhibits no edema.       Right ankle: She exhibits swelling. She exhibits normal range of motion and no deformity. Tenderness. Lateral malleolus and medial malleolus tenderness found.  Lymphadenopathy:    She has no cervical adenopathy.  Neurological: She is alert and oriented to person, place, and time. No cranial nerve deficit. She exhibits normal muscle tone. Coordination normal.  Skin: Skin is warm and dry. No rash noted. She is not diaphoretic. No erythema. No pallor.  Psychiatric: She has a normal mood and affect. Her behavior is normal. Judgment and thought content normal.          Assessment & Plan:   Problem List Items Addressed This Visit      Unprioritized   ESRD on hemodialysis    HD scheduled today. BP recheck ar HD.      HTN (hypertension), malignant    BP Readings from Last 3 Encounters:  03/05/15 202/64  02/13/15 138/47  01/30/15 188/70   BP elevated, including on recheck by me 210/69.  Discussed with neprhology. Will give second Clonidine 0.1mg  and then recheck BP today at HD.      Right ankle pain - Primary    Right ankle pain after fall. Ankle is tender over  bilateral malleoli. Will repeat xray. Referral to ortho. Percocet for severe pain only. Discussed risks of this medication. Follow up 2 weeks and prn.      Relevant Orders   DG Ankle Complete Right   Ambulatory referral to Orthopedic Surgery       Return in about 4 weeks (around 04/02/2015) for Recheck.

## 2015-03-05 NOTE — Assessment & Plan Note (Signed)
HD scheduled today. BP recheck ar HD.

## 2015-03-05 NOTE — Progress Notes (Signed)
Pre visit review using our clinic review tool, if applicable. No additional management support is needed unless otherwise documented below in the visit note. 

## 2015-03-05 NOTE — Assessment & Plan Note (Signed)
BP Readings from Last 3 Encounters:  03/05/15 202/64  02/13/15 138/47  01/30/15 188/70   BP elevated, including on recheck by me 210/69.  Discussed with neprhology. Will give second Clonidine 0.1mg  and then recheck BP today at HD.

## 2015-03-05 NOTE — Patient Instructions (Addendum)
Please go to [facility] for repeat xray of your ankle today.  Start Percocet 5-325mg  as needed for severe pain.  Blood pressure recheck at dialysis today.  We will set up evaluation with orthopedics.

## 2015-03-07 DIAGNOSIS — N186 End stage renal disease: Secondary | ICD-10-CM | POA: Diagnosis not present

## 2015-03-07 DIAGNOSIS — Z23 Encounter for immunization: Secondary | ICD-10-CM | POA: Diagnosis not present

## 2015-03-07 DIAGNOSIS — D509 Iron deficiency anemia, unspecified: Secondary | ICD-10-CM | POA: Diagnosis not present

## 2015-03-07 DIAGNOSIS — N2581 Secondary hyperparathyroidism of renal origin: Secondary | ICD-10-CM | POA: Diagnosis not present

## 2015-03-07 DIAGNOSIS — Z992 Dependence on renal dialysis: Secondary | ICD-10-CM | POA: Diagnosis not present

## 2015-03-07 DIAGNOSIS — D631 Anemia in chronic kidney disease: Secondary | ICD-10-CM | POA: Diagnosis not present

## 2015-03-07 NOTE — Unmapped (Signed)
Letter sent to patient and dialysis center

## 2015-03-09 DIAGNOSIS — Z992 Dependence on renal dialysis: Secondary | ICD-10-CM | POA: Diagnosis not present

## 2015-03-09 DIAGNOSIS — N186 End stage renal disease: Secondary | ICD-10-CM | POA: Diagnosis not present

## 2015-03-09 DIAGNOSIS — D631 Anemia in chronic kidney disease: Secondary | ICD-10-CM | POA: Diagnosis not present

## 2015-03-09 DIAGNOSIS — N2581 Secondary hyperparathyroidism of renal origin: Secondary | ICD-10-CM | POA: Diagnosis not present

## 2015-03-09 DIAGNOSIS — Z23 Encounter for immunization: Secondary | ICD-10-CM | POA: Diagnosis not present

## 2015-03-09 DIAGNOSIS — D509 Iron deficiency anemia, unspecified: Secondary | ICD-10-CM | POA: Diagnosis not present

## 2015-03-10 DIAGNOSIS — Z992 Dependence on renal dialysis: Secondary | ICD-10-CM | POA: Diagnosis not present

## 2015-03-10 DIAGNOSIS — N186 End stage renal disease: Secondary | ICD-10-CM | POA: Diagnosis not present

## 2015-03-12 DIAGNOSIS — Z992 Dependence on renal dialysis: Secondary | ICD-10-CM | POA: Diagnosis not present

## 2015-03-12 DIAGNOSIS — D509 Iron deficiency anemia, unspecified: Secondary | ICD-10-CM | POA: Diagnosis not present

## 2015-03-12 DIAGNOSIS — N2581 Secondary hyperparathyroidism of renal origin: Secondary | ICD-10-CM | POA: Diagnosis not present

## 2015-03-12 DIAGNOSIS — N186 End stage renal disease: Secondary | ICD-10-CM | POA: Diagnosis not present

## 2015-03-12 DIAGNOSIS — D631 Anemia in chronic kidney disease: Secondary | ICD-10-CM | POA: Diagnosis not present

## 2015-03-14 DIAGNOSIS — N2581 Secondary hyperparathyroidism of renal origin: Secondary | ICD-10-CM | POA: Diagnosis not present

## 2015-03-14 DIAGNOSIS — Z992 Dependence on renal dialysis: Secondary | ICD-10-CM | POA: Diagnosis not present

## 2015-03-14 DIAGNOSIS — N186 End stage renal disease: Secondary | ICD-10-CM | POA: Diagnosis not present

## 2015-03-14 DIAGNOSIS — D631 Anemia in chronic kidney disease: Secondary | ICD-10-CM | POA: Diagnosis not present

## 2015-03-14 DIAGNOSIS — D509 Iron deficiency anemia, unspecified: Secondary | ICD-10-CM | POA: Diagnosis not present

## 2015-03-15 DIAGNOSIS — S93411A Sprain of calcaneofibular ligament of right ankle, initial encounter: Secondary | ICD-10-CM | POA: Diagnosis not present

## 2015-03-15 DIAGNOSIS — M5416 Radiculopathy, lumbar region: Secondary | ICD-10-CM | POA: Diagnosis not present

## 2015-03-15 DIAGNOSIS — M5136 Other intervertebral disc degeneration, lumbar region: Secondary | ICD-10-CM | POA: Diagnosis not present

## 2015-03-16 DIAGNOSIS — N186 End stage renal disease: Secondary | ICD-10-CM | POA: Diagnosis not present

## 2015-03-16 DIAGNOSIS — D631 Anemia in chronic kidney disease: Secondary | ICD-10-CM | POA: Diagnosis not present

## 2015-03-16 DIAGNOSIS — D509 Iron deficiency anemia, unspecified: Secondary | ICD-10-CM | POA: Diagnosis not present

## 2015-03-16 DIAGNOSIS — Z992 Dependence on renal dialysis: Secondary | ICD-10-CM | POA: Diagnosis not present

## 2015-03-16 DIAGNOSIS — N2581 Secondary hyperparathyroidism of renal origin: Secondary | ICD-10-CM | POA: Diagnosis not present

## 2015-03-19 DIAGNOSIS — N186 End stage renal disease: Secondary | ICD-10-CM | POA: Diagnosis not present

## 2015-03-19 DIAGNOSIS — Z992 Dependence on renal dialysis: Secondary | ICD-10-CM | POA: Diagnosis not present

## 2015-03-19 DIAGNOSIS — D631 Anemia in chronic kidney disease: Secondary | ICD-10-CM | POA: Diagnosis not present

## 2015-03-19 DIAGNOSIS — N2581 Secondary hyperparathyroidism of renal origin: Secondary | ICD-10-CM | POA: Diagnosis not present

## 2015-03-19 DIAGNOSIS — D509 Iron deficiency anemia, unspecified: Secondary | ICD-10-CM | POA: Diagnosis not present

## 2015-03-21 ENCOUNTER — Encounter: Payer: Self-pay | Admitting: Internal Medicine

## 2015-03-21 ENCOUNTER — Ambulatory Visit (INDEPENDENT_AMBULATORY_CARE_PROVIDER_SITE_OTHER): Payer: Medicare Other | Admitting: Internal Medicine

## 2015-03-21 VITALS — BP 184/63 | HR 69 | Temp 98.4°F | Ht 68.0 in | Wt 171.2 lb

## 2015-03-21 DIAGNOSIS — D631 Anemia in chronic kidney disease: Secondary | ICD-10-CM | POA: Diagnosis not present

## 2015-03-21 DIAGNOSIS — N186 End stage renal disease: Secondary | ICD-10-CM | POA: Diagnosis not present

## 2015-03-21 DIAGNOSIS — Z992 Dependence on renal dialysis: Secondary | ICD-10-CM | POA: Diagnosis not present

## 2015-03-21 DIAGNOSIS — N2581 Secondary hyperparathyroidism of renal origin: Secondary | ICD-10-CM | POA: Diagnosis not present

## 2015-03-21 DIAGNOSIS — E1122 Type 2 diabetes mellitus with diabetic chronic kidney disease: Secondary | ICD-10-CM | POA: Diagnosis not present

## 2015-03-21 DIAGNOSIS — I639 Cerebral infarction, unspecified: Secondary | ICD-10-CM | POA: Diagnosis not present

## 2015-03-21 DIAGNOSIS — M25571 Pain in right ankle and joints of right foot: Secondary | ICD-10-CM | POA: Diagnosis not present

## 2015-03-21 DIAGNOSIS — I10 Essential (primary) hypertension: Secondary | ICD-10-CM

## 2015-03-21 DIAGNOSIS — D509 Iron deficiency anemia, unspecified: Secondary | ICD-10-CM | POA: Diagnosis not present

## 2015-03-21 NOTE — Progress Notes (Signed)
Pre visit review using our clinic review tool, if applicable. No additional management support is needed unless otherwise documented below in the visit note. 

## 2015-03-21 NOTE — Patient Instructions (Signed)
Labs today.   Follow up in 3 months.  

## 2015-03-21 NOTE — Assessment & Plan Note (Signed)
BG well controlled in past. Currently off medication. Will recheck A1c with labs today.

## 2015-03-21 NOTE — Assessment & Plan Note (Signed)
Right ankle pain. Symptoms and exam improved compared to previous visit. Following with ortho and starting PT.

## 2015-03-21 NOTE — Assessment & Plan Note (Signed)
BP Readings from Last 3 Encounters:  03/21/15 184/63  03/05/15 202/64  02/13/15 138/47   BP elevated today, however generally well controlled at HD. Will continue current medications.

## 2015-03-21 NOTE — Progress Notes (Signed)
Subjective:    Patient ID: Theresa [name], female    DOB: 1955/07/28, 59 y.o.   MRN: [id number]  HPI  59YO female presents for follow up.  Recently seen for right ankle pain. Seen by Dr. Mack Guise. Starting on PT. Pain improved. Swelling and ROM improved.  DM - Compliant with medication in past, however recently off insulin. Does not check BG.  HTN - BP has been generally well controlled. 117/54 at HD. Compliant with medication.  Wt Readings from Last 3 Encounters:  03/21/15 171 lb 4 oz (77.678 kg)  03/05/15 170 lb (77.111 kg)  02/13/15 168 lb (76.204 kg)   BP Readings from Last 3 Encounters:  03/21/15 184/63  03/05/15 202/64  02/13/15 138/47    Past Medical History  Diagnosis Date  . Hypertension   . ESRD on hemodialysis ([place])     a. [facility] in [place], [place], on a TTS schedule.  She started dialysis in Feb 2014.  Etiology of renal failure not known, likely diabetes.  Has a left upper arm AV graft.  Marland Kitchen COPD (chronic obstructive pulmonary disease) ([place])   . Emphysema   . History of pneumonia     June 2012  . History of bronchitis     Mar 2012  . Peripheral vascular disease ([place])   . Chronic constipation   . Colon polyps   . Sickle cell trait ([place])   . Anemia of chronic disease   . Diabetes mellitus   . Moderate mitral insufficiency     a. 10/2013 Echo: EF 55-60%, mod MR.  . Diabetic neuropathy ([place])   . Diabetic retinopathy ([place]) 05/28/2013    Hx bilat retinal detachment, proliferative diab retinopathy and bilat vitreous hemorrhage   . Hyperlipidemia   . Coronary artery disease     a. 05/2013 NSTEMI/PCI: LM 20d, LAD min irregs, LCX small, nl, OM1 nl, RCA dom 29m (2.5x16 Promus DES), PDA1 80p.  . Chronic diastolic CHF (congestive heart failure) ([place])     a. 10/2013 Echo [facility]): EF 55-60%, mod conc LVH, mod MR, mildly dil LA, mild Ao sclerosis w/o stenosis.  . Carotid arterial disease ([place])     a. 02/2013 U/S: 40-59% bilat ICA stenosis - *f/u 02/2014*  . History of  tobacco abuse     a. Quit 2012.   Family History  Problem Relation Age of Onset  . Anesthesia problems Neg Hx   . Hypotension Neg Hx   . Malignant hyperthermia Neg Hx   . Pseudochol deficiency Neg Hx   . Stroke Mother   . Heart attack Mother   . Heart disease Mother   . Colon cancer Father   . Colon cancer Sister   . Heart attack Brother   . Stroke Brother   . Diabetes Brother   . Breast cancer Sister   . Diabetes Brother   . Diabetes Daughter    Past Surgical History  Procedure Laterality Date  . Abdominal hysterectomy      2000  . Dilation and curettage of uterus      several in the early 80's  . Eye surgery      bilateral laser 2012  . Esophagogastroduodenoscopy      2012  . Colonscopy    . Tubal ligation      1979  . Pars plana vitrectomy  04/22/2011    Procedure: PARS PLANA VITRECTOMY WITH 25 GAUGE;  Surgeon: [name], MD;  Location: [place];  Service: Ophthalmology;  Laterality: Left;  membrane peel, endolaser, gas fluid exchange, silicone oil, repair of complex traction retinal detachment  . Pars plana vitrectomy  09/30/2011    Procedure: PARS PLANA VITRECTOMY WITH 25 GAUGE;  Surgeon: [name], MD;  Location: [place];  Service: Ophthalmology;  Laterality: Right;  Endolaser; Repair of Complex Traction Retinal Detachment  . Gas/fluid exchange  09/30/2011    Procedure: GAS/FLUID EXCHANGE;  Surgeon: [name], MD;  Location: [place];  Service: Ophthalmology;  Laterality: Right;  . Gas insertion  09/30/2011    Procedure: INSERTION OF GAS;  Surgeon: [name], MD;  Location: [place];  Service: Ophthalmology;  Laterality: Right;  [id number]  . Eye surgery      right  . Pars plana vitrectomy  02/24/2012    Procedure: PARS PLANA VITRECTOMY WITH 25 GAUGE;  Surgeon: [name], MD;  Location: [place];  Service: Ophthalmology;  Laterality: Left;  . Silicon oil removal  [id number]    Procedure: SILICON OIL REMOVAL;  Surgeon: [name], MD;  Location: [place];   Service: Ophthalmology;  Laterality: Left;  . Ptca    . Thrombectomy / arteriovenous graft revision    . Cardiac catheterization    . Coronary angioplasty  05/28/2014    stent placement to the mid RCA  . Left heart catheterization with coronary angiogram N/A 05/28/2013    Procedure: LEFT HEART CATHETERIZATION WITH CORONARY ANGIOGRAM;  Surgeon: [name], MD;  Location: [facility] C[facility] LAB;  Service: Cardiovascular;  Laterality: N/A;   Social History   Social History  . Marital Status: Divorced    Spouse Name: N/A  . Number of Children: 2  . Years of Education: N/A   Occupational History  . [place] History Main Topics  . Smoking status: Former Smoker -- 1.00 packs/day for 25 years    Quit date: 06/09/2010  . Smokeless tobacco: Never Used  . Alcohol Use: No  . Drug Use: No  . Sexual Activity: No   Other Topics Concern  . None   Social History Narrative    Review of Systems  Constitutional: Negative for fever, chills, appetite change, fatigue and unexpected weight change.  Eyes: Negative for visual disturbance.  Respiratory: Negative for cough and shortness of breath.   Cardiovascular: Negative for chest pain, palpitations and leg swelling.  Gastrointestinal: Negative for nausea, vomiting, abdominal pain, diarrhea and constipation.  Musculoskeletal: Positive for myalgias, joint swelling and arthralgias.  Skin: Negative for color change and rash.  Hematological: Negative for adenopathy. Does not bruise/bleed easily.  Psychiatric/Behavioral: Negative for sleep disturbance and dysphoric mood. The patient is not nervous/anxious.        Objective:    BP 184/63 mmHg  Pulse 69  Temp(Src) 98.4 F (36.9 C) (Oral)  Ht 5\' 8"  (1.727 m)  Wt 171 lb 4 oz (77.678 kg)  BMI 26.04 kg/m2  SpO2 96% Physical Exam  Constitutional: She is oriented to person, place, and time. She appears well-developed and well-nourished. No distress.  HENT:  Head:  Normocephalic and atraumatic.  Right Ear: External ear normal.  Left Ear: External ear normal.  Nose: Nose normal.  Mouth/Throat: Oropharynx is clear and moist. No oropharyngeal exudate.  Eyes: Conjunctivae are normal. Pupils are equal, round, and reactive to light. Right eye exhibits no discharge. Left eye exhibits no discharge. No scleral icterus.  Neck: Normal range of motion. Neck supple. No tracheal deviation present. No thyromegaly present.  Cardiovascular: Normal rate, regular rhythm,  normal heart sounds and intact distal pulses.  Exam reveals no gallop and no friction rub.   No murmur heard. Pulmonary/Chest: Effort normal and breath sounds normal. No respiratory distress. She has no wheezes. She has no rales. She exhibits no tenderness.  Musculoskeletal: She exhibits no edema or tenderness.       Right ankle: She exhibits decreased range of motion and swelling (trace over lateral maleolus). No tenderness.  Lymphadenopathy:    She has no cervical adenopathy.  Neurological: She is alert and oriented to person, place, and time. No cranial nerve deficit. She exhibits normal muscle tone. Coordination normal.  Skin: Skin is warm and dry. No rash noted. She is not diaphoretic. No erythema. No pallor.  Psychiatric: She has a normal mood and affect. Her behavior is normal. Judgment and thought content normal.          Assessment & Plan:   Problem List Items Addressed This Visit      Unprioritized   Diabetes mellitus, type II ([place]) - Primary    BG well controlled in past. Currently off medication. Will recheck A1c with labs today.      Relevant Orders   Hemoglobin A1c   ESRD on hemodialysis ([place])    Continues HD on TTS at [facility].      Essential (primary) hypertension    BP Readings from Last 3 Encounters:  03/21/15 184/63  03/05/15 202/64  02/13/15 138/47   BP elevated today, however generally well controlled at HD. Will continue current medications.       Right ankle  pain    Right ankle pain. Symptoms and exam improved compared to previous visit. Following with ortho and starting PT.          Return in about 3 months (around 06/21/2015) for Recheck of Diabetes.

## 2015-03-21 NOTE — Assessment & Plan Note (Signed)
Continues HD on TTS at [facility].

## 2015-03-23 ENCOUNTER — Telehealth: Payer: Self-pay | Admitting: Internal Medicine

## 2015-03-23 DIAGNOSIS — D631 Anemia in chronic kidney disease: Secondary | ICD-10-CM | POA: Diagnosis not present

## 2015-03-23 DIAGNOSIS — M25671 Stiffness of right ankle, not elsewhere classified: Secondary | ICD-10-CM | POA: Diagnosis not present

## 2015-03-23 DIAGNOSIS — D509 Iron deficiency anemia, unspecified: Secondary | ICD-10-CM | POA: Diagnosis not present

## 2015-03-23 DIAGNOSIS — R2689 Other abnormalities of gait and mobility: Secondary | ICD-10-CM | POA: Diagnosis not present

## 2015-03-23 DIAGNOSIS — M25571 Pain in right ankle and joints of right foot: Secondary | ICD-10-CM | POA: Diagnosis not present

## 2015-03-23 DIAGNOSIS — Z992 Dependence on renal dialysis: Secondary | ICD-10-CM | POA: Diagnosis not present

## 2015-03-23 DIAGNOSIS — N2581 Secondary hyperparathyroidism of renal origin: Secondary | ICD-10-CM | POA: Diagnosis not present

## 2015-03-23 DIAGNOSIS — N186 End stage renal disease: Secondary | ICD-10-CM | POA: Diagnosis not present

## 2015-03-23 NOTE — Telephone Encounter (Signed)
Pt called an stated that her A1C was 5.4 when checked in aug and will be rechecked on 03/30/15.Marland Kitchen Also stated with any lab orders to please send to [facility]

## 2015-03-26 DIAGNOSIS — N2581 Secondary hyperparathyroidism of renal origin: Secondary | ICD-10-CM | POA: Diagnosis not present

## 2015-03-26 DIAGNOSIS — D509 Iron deficiency anemia, unspecified: Secondary | ICD-10-CM | POA: Diagnosis not present

## 2015-03-26 DIAGNOSIS — N186 End stage renal disease: Secondary | ICD-10-CM | POA: Diagnosis not present

## 2015-03-26 DIAGNOSIS — D631 Anemia in chronic kidney disease: Secondary | ICD-10-CM | POA: Diagnosis not present

## 2015-03-26 DIAGNOSIS — Z992 Dependence on renal dialysis: Secondary | ICD-10-CM | POA: Diagnosis not present

## 2015-03-27 DIAGNOSIS — M25671 Stiffness of right ankle, not elsewhere classified: Secondary | ICD-10-CM | POA: Diagnosis not present

## 2015-03-27 DIAGNOSIS — M25571 Pain in right ankle and joints of right foot: Secondary | ICD-10-CM | POA: Diagnosis not present

## 2015-03-28 DIAGNOSIS — D509 Iron deficiency anemia, unspecified: Secondary | ICD-10-CM | POA: Diagnosis not present

## 2015-03-28 DIAGNOSIS — N186 End stage renal disease: Secondary | ICD-10-CM | POA: Diagnosis not present

## 2015-03-28 DIAGNOSIS — Z992 Dependence on renal dialysis: Secondary | ICD-10-CM | POA: Diagnosis not present

## 2015-03-28 DIAGNOSIS — D631 Anemia in chronic kidney disease: Secondary | ICD-10-CM | POA: Diagnosis not present

## 2015-03-28 DIAGNOSIS — N2581 Secondary hyperparathyroidism of renal origin: Secondary | ICD-10-CM | POA: Diagnosis not present

## 2015-03-28 NOTE — Telephone Encounter (Signed)
Left vm notifying pt that we will need a fax number to send the requested order

## 2015-03-30 ENCOUNTER — Encounter: Payer: Self-pay | Admitting: Sports Medicine

## 2015-03-30 ENCOUNTER — Ambulatory Visit (INDEPENDENT_AMBULATORY_CARE_PROVIDER_SITE_OTHER): Payer: Medicare Other | Admitting: Sports Medicine

## 2015-03-30 DIAGNOSIS — B351 Tinea unguium: Secondary | ICD-10-CM | POA: Diagnosis not present

## 2015-03-30 DIAGNOSIS — E1140 Type 2 diabetes mellitus with diabetic neuropathy, unspecified: Secondary | ICD-10-CM | POA: Diagnosis not present

## 2015-03-30 DIAGNOSIS — Z992 Dependence on renal dialysis: Secondary | ICD-10-CM | POA: Diagnosis not present

## 2015-03-30 DIAGNOSIS — N2581 Secondary hyperparathyroidism of renal origin: Secondary | ICD-10-CM | POA: Diagnosis not present

## 2015-03-30 DIAGNOSIS — M79676 Pain in unspecified toe(s): Secondary | ICD-10-CM | POA: Diagnosis not present

## 2015-03-30 DIAGNOSIS — D509 Iron deficiency anemia, unspecified: Secondary | ICD-10-CM | POA: Diagnosis not present

## 2015-03-30 DIAGNOSIS — N186 End stage renal disease: Secondary | ICD-10-CM | POA: Diagnosis not present

## 2015-03-30 DIAGNOSIS — D631 Anemia in chronic kidney disease: Secondary | ICD-10-CM | POA: Diagnosis not present

## 2015-03-30 NOTE — Progress Notes (Signed)
Patient ID: Theresa [name], female   DOB: Mar 30, 1956, 59 y.o.   MRN: [id number] Subjective: Theresa [name] is a 59 y.o. female patient with history of type 2 diabetes who presents to office today complaining of long, painful nails  while ambulating in shoes; unable to trim. Patient states that the glucose reading yesterday was 119m/dl. Admits to ankle sprain on right that happened in September that is being treated by Ortho. Patient denies any new changes in medication or new problems. Patient denies any new cramping, numbness, burning or tingling in the legs.  Patient Active Problem List   Diagnosis Date Noted  . Right ankle pain 03/05/2015  . Neuritis or radiculitis due to rupture of lumbar intervertebral disc 11/28/2014  . Acute CVA (cerebrovascular accident) ([place] 11/23/2014  . Right hip pain 06/13/2014  . Other malaise and fatigue 02/21/2014  . Tachycardia 01/26/2014  . Routine general medical examination at a health care facility 09/02/2013  . Osteoarthritis of right hip 08/23/2013  . DDD (degenerative disc disease), lumbosacral 08/23/2013  . Coronary artery disease 06/27/2013  . NSTEMI (non-ST elevated myocardial infarction) ([place] 05/30/2013  . Diabetic retinopathy ([place] 05/28/2013  . Hyperlipidemia   . End stage kidney disease ([place] 11/08/2012  . Essential (primary) hypertension 11/08/2012  . Diabetic neuropathy ([facility] 02/18/2012  . Anemia in chronic kidney disease (CKD) 02/18/2012  . ESRD on hemodialysis ([place] 08/21/2011  . Diabetes mellitus, type II ([place] 06/30/2011  . COPD (chronic obstructive pulmonary disease) ([place] 06/13/2011   Current Outpatient Prescriptions on File Prior to Visit  Medication Sig Dispense Refill  . albuterol (PROVENTIL HFA;VENTOLIN HFA) 108 (90 BASE) MCG/ACT inhaler Inhale 2 puffs into the lungs every 6 (six) hours as needed for wheezing or shortness of breath.    .Marland Kitchenaspirin EC 81 MG tablet Take 81 mg by mouth daily.    .Marland KitchenBAYER CONTOUR TEST test strip TEST  BLOOD SUGARS 2 TIMES DAILY 100 each PRN  . Blood Glucose Monitoring Suppl (ONE TOUCH ULTRA SYSTEM KIT) W/DEVICE KIT Please dispense for patient to use at home to check blood sugars. (Patient taking differently: 1 kit by Other route See admin instructions. Pt uses to test blood sugars.) 1 each 0  . calcium acetate (PHOSLO) 667 MG capsule Take 667-2,668 mg by mouth 5 (five) times daily. Pt takes four capsules with meals and two capsules with snacks.    . carvedilol (COREG) 12.5 MG tablet Take 1 tablet (12.5 mg total) by mouth 2 (two) times daily. 60 tablet 6  . cloNIDine (CATAPRES) 0.1 MG tablet Take 1 tablet (0.1 mg total) by mouth 2 (two) times daily. 60 tablet 11  . Insulin Detemir (LEVEMIR) 100 UNIT/ML Pen Inject 5 Units into the skin daily. 15 mL 3  . Lancets Thin MISC Using at home to test blood sugars twice daily. (Patient taking differently: 1 each by Other route 2 (two) times daily. Pt uses to test blood sugars.) 200 each 6  . mometasone (NASONEX) 50 MCG/ACT nasal spray Place 2 sprays into the nose 2 (two) times daily as needed (for allergies).    . nitroGLYCERIN (NITROSTAT) 0.4 MG SL tablet Place 1 tablet (0.4 mg total) under the tongue every 5 (five) minutes as needed for chest pain. 25 tablet 3  . NOVOFINE 32G X 6 MM MISC USE AS DIRECTED 100 each 0  . oxyCODONE-acetaminophen (ROXICET) 5-325 MG per tablet Take 1 tablet by mouth every 8 (eight) hours as needed for severe pain. 20 tablet 0  .  pregabalin (LYRICA) 75 MG capsule Take 1 capsule (75 mg total) by mouth daily. 30 capsule 3  . rosuvastatin (CRESTOR) 40 MG tablet Take 1 tablet (40 mg total) by mouth daily. 90 tablet 3   No current facility-administered medications on file prior to visit.   Allergies  Allergen Reactions  . Metformin Diarrhea  . Hydrocodone Hives    Labs:HEMOGLOBIN A1C, Per patient 5.7 mg/dl   Objective: General: Patient is awake, alert, and oriented x 3 and in no acute distress.  Integument: Skin is warm, dry  and supple bilateral. Nails are tender, long, thickened and  dystrophic with subungual debris, consistent with onychomycosis, 1-5 bilateral. No signs of infection. No open lesions or preulcerative lesions present bilateral. Remaining integument unremarkable.  Vasculature:  Dorsalis Pedis pulse 2/4 bilateral. Posterior Tibial pulse  1/4 bilateral.  Capillary fill time <3 sec 1-5 bilateral. Scant hair growth to the level of the digits. Temperature gradient within normal limits. No varicosities present bilateral. No edema present bilateral.   Neurology: The patient has intact sensation measured with a 5.07/10g Semmes Weinstein Monofilament at all pedal sites bilateral . Vibratory sensation diminished bilateral with tuning fork. No Babinski sign present bilateral.   Musculoskeletal: Mild asymptomatic hammertoes bilateral. Muscular strength 5/5 in all lower extremity muscular groups bilateral without pain or limitation on range of motion . No tenderness with calf compression bilateral.  Assessment and Plan: Problem List Items Addressed This Visit      Endocrine   Diabetes mellitus, type II ([place])    Other Visit Diagnoses    Dermatophytosis of nail    -  Primary    Pain of toe, unspecified laterality          -Examined patient. -Discussed and educated patient on diabetic foot care, especially with  regards to the vascular, neurological and musculoskeletal systems.  -Stressed the importance of good glycemic control and the detriment of not  controlling glucose levels in relation to the foot. -Mechanically debrided all nails 1-5 bilateral using sterile nail nipper and filed with dremel without incident  -Answered all patient questions -Patient to return as needed or in 3 months for at risk foot care -Patient advised to call the office if any problems or questions arise in the  Meantime.  Landis Martins, DPM

## 2015-04-02 ENCOUNTER — Ambulatory Visit: Payer: Medicare Other

## 2015-04-02 DIAGNOSIS — Z992 Dependence on renal dialysis: Secondary | ICD-10-CM | POA: Diagnosis not present

## 2015-04-02 DIAGNOSIS — N2581 Secondary hyperparathyroidism of renal origin: Secondary | ICD-10-CM | POA: Diagnosis not present

## 2015-04-02 DIAGNOSIS — D631 Anemia in chronic kidney disease: Secondary | ICD-10-CM | POA: Diagnosis not present

## 2015-04-02 DIAGNOSIS — N186 End stage renal disease: Secondary | ICD-10-CM | POA: Diagnosis not present

## 2015-04-02 DIAGNOSIS — D509 Iron deficiency anemia, unspecified: Secondary | ICD-10-CM | POA: Diagnosis not present

## 2015-04-04 DIAGNOSIS — N186 End stage renal disease: Secondary | ICD-10-CM | POA: Diagnosis not present

## 2015-04-04 DIAGNOSIS — D509 Iron deficiency anemia, unspecified: Secondary | ICD-10-CM | POA: Diagnosis not present

## 2015-04-04 DIAGNOSIS — N2581 Secondary hyperparathyroidism of renal origin: Secondary | ICD-10-CM | POA: Diagnosis not present

## 2015-04-04 DIAGNOSIS — D631 Anemia in chronic kidney disease: Secondary | ICD-10-CM | POA: Diagnosis not present

## 2015-04-04 DIAGNOSIS — Z992 Dependence on renal dialysis: Secondary | ICD-10-CM | POA: Diagnosis not present

## 2015-04-05 DIAGNOSIS — M6281 Muscle weakness (generalized): Secondary | ICD-10-CM | POA: Diagnosis not present

## 2015-04-06 DIAGNOSIS — N2581 Secondary hyperparathyroidism of renal origin: Secondary | ICD-10-CM | POA: Diagnosis not present

## 2015-04-06 DIAGNOSIS — N186 End stage renal disease: Secondary | ICD-10-CM | POA: Diagnosis not present

## 2015-04-06 DIAGNOSIS — D509 Iron deficiency anemia, unspecified: Secondary | ICD-10-CM | POA: Diagnosis not present

## 2015-04-06 DIAGNOSIS — D631 Anemia in chronic kidney disease: Secondary | ICD-10-CM | POA: Diagnosis not present

## 2015-04-06 DIAGNOSIS — Z992 Dependence on renal dialysis: Secondary | ICD-10-CM | POA: Diagnosis not present

## 2015-04-09 DIAGNOSIS — D509 Iron deficiency anemia, unspecified: Secondary | ICD-10-CM | POA: Diagnosis not present

## 2015-04-09 DIAGNOSIS — Z992 Dependence on renal dialysis: Secondary | ICD-10-CM | POA: Diagnosis not present

## 2015-04-09 DIAGNOSIS — N186 End stage renal disease: Secondary | ICD-10-CM | POA: Diagnosis not present

## 2015-04-09 DIAGNOSIS — N2581 Secondary hyperparathyroidism of renal origin: Secondary | ICD-10-CM | POA: Diagnosis not present

## 2015-04-09 DIAGNOSIS — D631 Anemia in chronic kidney disease: Secondary | ICD-10-CM | POA: Diagnosis not present

## 2015-04-10 DIAGNOSIS — N186 End stage renal disease: Secondary | ICD-10-CM | POA: Diagnosis not present

## 2015-04-10 DIAGNOSIS — Z992 Dependence on renal dialysis: Secondary | ICD-10-CM | POA: Diagnosis not present

## 2015-04-11 DIAGNOSIS — M25671 Stiffness of right ankle, not elsewhere classified: Secondary | ICD-10-CM | POA: Diagnosis not present

## 2015-04-11 DIAGNOSIS — M25571 Pain in right ankle and joints of right foot: Secondary | ICD-10-CM | POA: Diagnosis not present

## 2015-04-11 DIAGNOSIS — N186 End stage renal disease: Secondary | ICD-10-CM | POA: Diagnosis not present

## 2015-04-11 DIAGNOSIS — N2581 Secondary hyperparathyroidism of renal origin: Secondary | ICD-10-CM | POA: Diagnosis not present

## 2015-04-11 DIAGNOSIS — E1122 Type 2 diabetes mellitus with diabetic chronic kidney disease: Secondary | ICD-10-CM | POA: Diagnosis not present

## 2015-04-11 DIAGNOSIS — E1165 Type 2 diabetes mellitus with hyperglycemia: Secondary | ICD-10-CM | POA: Diagnosis not present

## 2015-04-11 DIAGNOSIS — D509 Iron deficiency anemia, unspecified: Secondary | ICD-10-CM | POA: Diagnosis not present

## 2015-04-11 DIAGNOSIS — Z992 Dependence on renal dialysis: Secondary | ICD-10-CM | POA: Diagnosis not present

## 2015-04-11 DIAGNOSIS — D631 Anemia in chronic kidney disease: Secondary | ICD-10-CM | POA: Diagnosis not present

## 2015-04-13 DIAGNOSIS — N2581 Secondary hyperparathyroidism of renal origin: Secondary | ICD-10-CM | POA: Diagnosis not present

## 2015-04-13 DIAGNOSIS — D631 Anemia in chronic kidney disease: Secondary | ICD-10-CM | POA: Diagnosis not present

## 2015-04-13 DIAGNOSIS — E1122 Type 2 diabetes mellitus with diabetic chronic kidney disease: Secondary | ICD-10-CM | POA: Diagnosis not present

## 2015-04-13 DIAGNOSIS — E1165 Type 2 diabetes mellitus with hyperglycemia: Secondary | ICD-10-CM | POA: Diagnosis not present

## 2015-04-13 DIAGNOSIS — D509 Iron deficiency anemia, unspecified: Secondary | ICD-10-CM | POA: Diagnosis not present

## 2015-04-13 DIAGNOSIS — N186 End stage renal disease: Secondary | ICD-10-CM | POA: Diagnosis not present

## 2015-04-16 DIAGNOSIS — N186 End stage renal disease: Secondary | ICD-10-CM | POA: Diagnosis not present

## 2015-04-16 DIAGNOSIS — N2581 Secondary hyperparathyroidism of renal origin: Secondary | ICD-10-CM | POA: Diagnosis not present

## 2015-04-16 DIAGNOSIS — E1122 Type 2 diabetes mellitus with diabetic chronic kidney disease: Secondary | ICD-10-CM | POA: Diagnosis not present

## 2015-04-16 DIAGNOSIS — D631 Anemia in chronic kidney disease: Secondary | ICD-10-CM | POA: Diagnosis not present

## 2015-04-16 DIAGNOSIS — D509 Iron deficiency anemia, unspecified: Secondary | ICD-10-CM | POA: Diagnosis not present

## 2015-04-16 DIAGNOSIS — E1165 Type 2 diabetes mellitus with hyperglycemia: Secondary | ICD-10-CM | POA: Diagnosis not present

## 2015-04-18 DIAGNOSIS — N186 End stage renal disease: Secondary | ICD-10-CM | POA: Diagnosis not present

## 2015-04-18 DIAGNOSIS — R2689 Other abnormalities of gait and mobility: Secondary | ICD-10-CM | POA: Diagnosis not present

## 2015-04-18 DIAGNOSIS — N2581 Secondary hyperparathyroidism of renal origin: Secondary | ICD-10-CM | POA: Diagnosis not present

## 2015-04-18 DIAGNOSIS — E1165 Type 2 diabetes mellitus with hyperglycemia: Secondary | ICD-10-CM | POA: Diagnosis not present

## 2015-04-18 DIAGNOSIS — M25571 Pain in right ankle and joints of right foot: Secondary | ICD-10-CM | POA: Diagnosis not present

## 2015-04-18 DIAGNOSIS — D631 Anemia in chronic kidney disease: Secondary | ICD-10-CM | POA: Diagnosis not present

## 2015-04-18 DIAGNOSIS — E1122 Type 2 diabetes mellitus with diabetic chronic kidney disease: Secondary | ICD-10-CM | POA: Diagnosis not present

## 2015-04-18 DIAGNOSIS — D509 Iron deficiency anemia, unspecified: Secondary | ICD-10-CM | POA: Diagnosis not present

## 2015-04-18 NOTE — Telephone Encounter (Signed)
Pt called stating that [facility] fax [phone] and it is located on [address] [place].. Please advise pt if you have any further questions.

## 2015-04-18 NOTE — Telephone Encounter (Signed)
Order has been faxed

## 2015-04-19 DIAGNOSIS — M5416 Radiculopathy, lumbar region: Secondary | ICD-10-CM | POA: Diagnosis not present

## 2015-04-19 DIAGNOSIS — M5136 Other intervertebral disc degeneration, lumbar region: Secondary | ICD-10-CM | POA: Diagnosis not present

## 2015-04-19 NOTE — Patient Outreach (Signed)
[facility]) Care Management  04/19/2015  Theresa [name] [birth year] [id number]   Notification from [name], RN to close case due to unable to contact patient for [place] Management services.  Thanks, Ronnell [place]. [place], [place] Assistant Phone: [phone] Fax: [phone]

## 2015-04-20 DIAGNOSIS — E1165 Type 2 diabetes mellitus with hyperglycemia: Secondary | ICD-10-CM | POA: Diagnosis not present

## 2015-04-20 DIAGNOSIS — N186 End stage renal disease: Secondary | ICD-10-CM | POA: Diagnosis not present

## 2015-04-20 DIAGNOSIS — D509 Iron deficiency anemia, unspecified: Secondary | ICD-10-CM | POA: Diagnosis not present

## 2015-04-20 DIAGNOSIS — N2581 Secondary hyperparathyroidism of renal origin: Secondary | ICD-10-CM | POA: Diagnosis not present

## 2015-04-20 DIAGNOSIS — D631 Anemia in chronic kidney disease: Secondary | ICD-10-CM | POA: Diagnosis not present

## 2015-04-20 DIAGNOSIS — E1122 Type 2 diabetes mellitus with diabetic chronic kidney disease: Secondary | ICD-10-CM | POA: Diagnosis not present

## 2015-04-23 DIAGNOSIS — N2581 Secondary hyperparathyroidism of renal origin: Secondary | ICD-10-CM | POA: Diagnosis not present

## 2015-04-23 DIAGNOSIS — D509 Iron deficiency anemia, unspecified: Secondary | ICD-10-CM | POA: Diagnosis not present

## 2015-04-23 DIAGNOSIS — E1122 Type 2 diabetes mellitus with diabetic chronic kidney disease: Secondary | ICD-10-CM | POA: Diagnosis not present

## 2015-04-23 DIAGNOSIS — D631 Anemia in chronic kidney disease: Secondary | ICD-10-CM | POA: Diagnosis not present

## 2015-04-23 DIAGNOSIS — E1165 Type 2 diabetes mellitus with hyperglycemia: Secondary | ICD-10-CM | POA: Diagnosis not present

## 2015-04-23 DIAGNOSIS — N186 End stage renal disease: Secondary | ICD-10-CM | POA: Diagnosis not present

## 2015-04-24 DIAGNOSIS — R2689 Other abnormalities of gait and mobility: Secondary | ICD-10-CM | POA: Diagnosis not present

## 2015-04-24 DIAGNOSIS — M25571 Pain in right ankle and joints of right foot: Secondary | ICD-10-CM | POA: Diagnosis not present

## 2015-04-25 DIAGNOSIS — N186 End stage renal disease: Secondary | ICD-10-CM | POA: Diagnosis not present

## 2015-04-25 DIAGNOSIS — E1122 Type 2 diabetes mellitus with diabetic chronic kidney disease: Secondary | ICD-10-CM | POA: Diagnosis not present

## 2015-04-25 DIAGNOSIS — E1165 Type 2 diabetes mellitus with hyperglycemia: Secondary | ICD-10-CM | POA: Diagnosis not present

## 2015-04-25 DIAGNOSIS — D631 Anemia in chronic kidney disease: Secondary | ICD-10-CM | POA: Diagnosis not present

## 2015-04-25 DIAGNOSIS — N2581 Secondary hyperparathyroidism of renal origin: Secondary | ICD-10-CM | POA: Diagnosis not present

## 2015-04-25 DIAGNOSIS — D509 Iron deficiency anemia, unspecified: Secondary | ICD-10-CM | POA: Diagnosis not present

## 2015-04-26 NOTE — Telephone Encounter (Signed)
This encounter was created in error - please disregard.

## 2015-04-27 DIAGNOSIS — D509 Iron deficiency anemia, unspecified: Secondary | ICD-10-CM | POA: Diagnosis not present

## 2015-04-27 DIAGNOSIS — N186 End stage renal disease: Secondary | ICD-10-CM | POA: Diagnosis not present

## 2015-04-27 DIAGNOSIS — E1165 Type 2 diabetes mellitus with hyperglycemia: Secondary | ICD-10-CM | POA: Diagnosis not present

## 2015-04-27 DIAGNOSIS — D631 Anemia in chronic kidney disease: Secondary | ICD-10-CM | POA: Diagnosis not present

## 2015-04-27 DIAGNOSIS — E1122 Type 2 diabetes mellitus with diabetic chronic kidney disease: Secondary | ICD-10-CM | POA: Diagnosis not present

## 2015-04-27 DIAGNOSIS — N2581 Secondary hyperparathyroidism of renal origin: Secondary | ICD-10-CM | POA: Diagnosis not present

## 2015-04-29 DIAGNOSIS — D631 Anemia in chronic kidney disease: Secondary | ICD-10-CM | POA: Diagnosis not present

## 2015-04-29 DIAGNOSIS — N2581 Secondary hyperparathyroidism of renal origin: Secondary | ICD-10-CM | POA: Diagnosis not present

## 2015-04-29 DIAGNOSIS — D509 Iron deficiency anemia, unspecified: Secondary | ICD-10-CM | POA: Diagnosis not present

## 2015-04-29 DIAGNOSIS — E1165 Type 2 diabetes mellitus with hyperglycemia: Secondary | ICD-10-CM | POA: Diagnosis not present

## 2015-04-29 DIAGNOSIS — N186 End stage renal disease: Secondary | ICD-10-CM | POA: Diagnosis not present

## 2015-04-29 DIAGNOSIS — E1122 Type 2 diabetes mellitus with diabetic chronic kidney disease: Secondary | ICD-10-CM | POA: Diagnosis not present

## 2015-04-30 DIAGNOSIS — M25671 Stiffness of right ankle, not elsewhere classified: Secondary | ICD-10-CM | POA: Diagnosis not present

## 2015-04-30 DIAGNOSIS — M25571 Pain in right ankle and joints of right foot: Secondary | ICD-10-CM | POA: Diagnosis not present

## 2015-05-01 ENCOUNTER — Encounter: Payer: Self-pay | Admitting: Cardiovascular Disease

## 2015-05-01 ENCOUNTER — Ambulatory Visit (INDEPENDENT_AMBULATORY_CARE_PROVIDER_SITE_OTHER): Payer: Medicare Other | Admitting: Cardiovascular Disease

## 2015-05-01 VITALS — BP 140/70 | HR 80 | Ht 67.5 in | Wt 169.2 lb

## 2015-05-01 DIAGNOSIS — E1165 Type 2 diabetes mellitus with hyperglycemia: Secondary | ICD-10-CM | POA: Diagnosis not present

## 2015-05-01 DIAGNOSIS — N186 End stage renal disease: Secondary | ICD-10-CM | POA: Diagnosis not present

## 2015-05-01 DIAGNOSIS — D631 Anemia in chronic kidney disease: Secondary | ICD-10-CM | POA: Diagnosis not present

## 2015-05-01 DIAGNOSIS — D509 Iron deficiency anemia, unspecified: Secondary | ICD-10-CM | POA: Diagnosis not present

## 2015-05-01 DIAGNOSIS — I10 Essential (primary) hypertension: Secondary | ICD-10-CM

## 2015-05-01 DIAGNOSIS — I2510 Atherosclerotic heart disease of native coronary artery without angina pectoris: Secondary | ICD-10-CM

## 2015-05-01 DIAGNOSIS — R9439 Abnormal result of other cardiovascular function study: Secondary | ICD-10-CM

## 2015-05-01 DIAGNOSIS — Z992 Dependence on renal dialysis: Secondary | ICD-10-CM

## 2015-05-01 DIAGNOSIS — E785 Hyperlipidemia, unspecified: Secondary | ICD-10-CM

## 2015-05-01 DIAGNOSIS — S93411D Sprain of calcaneofibular ligament of right ankle, subsequent encounter: Secondary | ICD-10-CM | POA: Diagnosis not present

## 2015-05-01 DIAGNOSIS — E1122 Type 2 diabetes mellitus with diabetic chronic kidney disease: Secondary | ICD-10-CM

## 2015-05-01 DIAGNOSIS — I252 Old myocardial infarction: Secondary | ICD-10-CM

## 2015-05-01 DIAGNOSIS — I639 Cerebral infarction, unspecified: Secondary | ICD-10-CM | POA: Diagnosis not present

## 2015-05-01 DIAGNOSIS — N2581 Secondary hyperparathyroidism of renal origin: Secondary | ICD-10-CM | POA: Diagnosis not present

## 2015-05-01 NOTE — Assessment & Plan Note (Signed)
Blood pressure is well controlled on today's visit. No changes made to the medications. 

## 2015-05-01 NOTE — Assessment & Plan Note (Signed)
Cholesterol is at goal on the current lipid regimen. No changes to the medications were made.  

## 2015-05-01 NOTE — Progress Notes (Addendum)
Patient ID: Theresa [name], female    DOB: 05-Mar-1956, 59 y.o.   MRN: [id number]  HPI Comments: Theresa [name] is a pleasant 18 -year old woman with 25 years of smoking, diabetes, chronic renal insufficiency,  previously noted to have moderate to severe mitral valve regurgitation on prior echocardiogram (improved to moderate MR on recent echo), pneumonia in June of 2012 with admission overnight to [facility] who initially presented by referral from Dr. Candiss Norse (renal) for evaluation of her ejection fraction which is 45% and mitral valve disease.  on dialysis since February 2014, 3 days per week. She has 40-59% bilateral carotid arterial disease as of September 2014  Drug-eluting stent placed December 2014 to the mid RCA She presents today for follow-up of her coronary artery disease and recent positive stress test  She reports having routine stress testing through [facility] on 04/17/2015 as part of her preop evaluation for renal transplant. Stress test shows moderate in size, moderate severity reversible defect of the inferior wall consistent with ischemia also defect noted in the apical septal, anteroseptal region, ejection fraction 60% He was recommended that she follow-up with [place] today. She has been taken off the renal transplant list per the patient  Echocardiogram shows normal LV function, right-sided pressures 45 mmHg moderate MR  Other past medical history evaluation in the hospital  05/12/2014 for fluid overload, hyperkalemia, shortness of breath, saturations 89% on room air, systolic blood pressure 010. She had dialysis with improvement of her symptoms. She does hemodialysis on Monday, Wednesday, Friday She was told that her potassium intake was too high.   Previously he would wake up with tachycardia.  sits up and symptoms typically resolve.   hospital admission for pneumonia 05/28/2013, elevated cardiac enzymes, transferred to [place] for cardiac catheterization. She had stent placed for 95% mid  RCA lesion. She had 2.5 x 16 mm Promus stent placed, post dilated up to 2.78 mm . Since then she reports she is doing well with no further chest pain or shortness of breath . She is on the renal transplant list but reports this will require her to be on the list for 5 years before transplant eligibility   Readmission to the hospital 06/14/2013 for C. difficile colitis. Discharge on 06/16/2013 C. difficile was indigent positive, toxin negative. She was treated with Flagyl for 10 days  Interestingly Stress test at [facility] August 2014 was essentially normal with normal ejection fraction (there was mention of very small mild region of artifact) Echocardiogram at [facility] July 2014 showed ejection fraction greater than 55%, mild MR  She sees Dr. Candiss Norse  of [place] [facility] .  previous  AV graft on 02/23/2013. Graft was thrombosed. Status post revision   Echocardiogram on November 10, 2010 shows mildly dilated left ventricle, ejection fraction 45-50%, mild MS, moderate to severe mitral regurg, mild to moderate tricuspid regurg.     Allergies  Allergen Reactions  . Metformin Diarrhea  . Hydrocodone Hives    Outpatient Encounter Prescriptions as of 05/01/2015  Medication Sig  . albuterol (PROVENTIL HFA;VENTOLIN HFA) 108 (90 BASE) MCG/ACT inhaler Inhale 2 puffs into the lungs every 6 (six) hours as needed for wheezing or shortness of breath.  Marland Kitchen aspirin EC 81 MG tablet Take 81 mg by mouth daily.  Marland Kitchen BAYER CONTOUR TEST test strip TEST BLOOD SUGARS 2 TIMES DAILY  . Blood Glucose Monitoring Suppl (ONE TOUCH ULTRA SYSTEM KIT) W/DEVICE KIT Please dispense for patient to use at home to check blood sugars. (Patient taking  differently: 1 kit by Other route See admin instructions. Pt uses to test blood sugars.)  . calcium acetate (PHOSLO) 667 MG capsule Take 667-2,668 mg by mouth 5 (five) times daily. Pt takes four capsules with meals and two capsules with snacks.  . carvedilol (COREG) 12.5 MG tablet Take 1 tablet (12.5  mg total) by mouth 2 (two) times daily.  . cloNIDine (CATAPRES) 0.1 MG tablet Take 1 tablet (0.1 mg total) by mouth 2 (two) times daily.  . Insulin Detemir (LEVEMIR) 100 UNIT/ML Pen Inject 5 Units into the skin daily.  . Lancets Thin MISC Using at home to test blood sugars twice daily. (Patient taking differently: 1 each by Other route 2 (two) times daily. Pt uses to test blood sugars.)  . mometasone (NASONEX) 50 MCG/ACT nasal spray Place 2 sprays into the nose 2 (two) times daily as needed (for allergies).  . nitroGLYCERIN (NITROSTAT) 0.4 MG SL tablet Place 1 tablet (0.4 mg total) under the tongue every 5 (five) minutes as needed for chest pain.  Marland Kitchen NOVOFINE 32G X 6 MM MISC USE AS DIRECTED  . pregabalin (LYRICA) 75 MG capsule Take 1 capsule (75 mg total) by mouth daily.  . rosuvastatin (CRESTOR) 40 MG tablet Take 1 tablet (40 mg total) by mouth daily.  . [DISCONTINUED] oxyCODONE-acetaminophen (ROXICET) 5-325 MG per tablet Take 1 tablet by mouth every 8 (eight) hours as needed for severe pain. (Patient not taking: Reported on 05/01/2015)   No facility-administered encounter medications on file as of 05/01/2015.    Past Medical History  Diagnosis Date  . Hypertension   . ESRD on hemodialysis ([place])     a. [facility] in [place], [place], on a TTS schedule.  She started dialysis in Feb 2014.  Etiology of renal failure not known, likely diabetes.  Has a left upper arm AV graft.  Marland Kitchen COPD (chronic obstructive pulmonary disease) ([place])   . Emphysema   . History of pneumonia     June 2012  . History of bronchitis     Mar 2012  . Peripheral vascular disease ([place])   . Chronic constipation   . Colon polyps   . Sickle cell trait ([place])   . Anemia of chronic disease   . Diabetes mellitus   . Moderate mitral insufficiency     a. 10/2013 Echo: EF 55-60%, mod MR.  . Diabetic neuropathy ([place])   . Diabetic retinopathy ([place]) 05/28/2013    Hx bilat retinal detachment, proliferative diab retinopathy and bilat  vitreous hemorrhage   . Hyperlipidemia   . Coronary artery disease     a. 05/2013 NSTEMI/PCI: LM 20d, LAD min irregs, LCX small, nl, OM1 nl, RCA dom 29m(2.5x16 Promus DES), PDA1 80p.  . Chronic diastolic CHF (congestive heart failure) ([place]     a. 10/2013 Echo ([facility]: EF 55-60%, mod conc LVH, mod MR, mildly dil LA, mild Ao sclerosis w/o stenosis.  . Carotid arterial disease ([place]     a. 02/2013 U/S: 40-59% bilat ICA stenosis - *f/u 02/2014*  . History of tobacco abuse     a. Quit 2012.    Past Surgical History  Procedure Laterality Date  . Abdominal hysterectomy      2000  . Dilation and curettage of uterus      several in the early 80's  . Eye surgery      bilateral laser 2012  . Esophagogastroduodenoscopy      2012  . Colonscopy    . Tubal ligation  1979  . Pars plana vitrectomy  04/22/2011    Procedure: PARS PLANA VITRECTOMY WITH 25 GAUGE;  Surgeon: [name], MD;  Location: [place];  Service: Ophthalmology;  Laterality: Left;  membrane peel, endolaser, gas fluid exchange, silicone oil, repair of complex traction retinal detachment  . Pars plana vitrectomy  09/30/2011    Procedure: PARS PLANA VITRECTOMY WITH 25 GAUGE;  Surgeon: [name], MD;  Location: [place];  Service: Ophthalmology;  Laterality: Right;  Endolaser; Repair of Complex Traction Retinal Detachment  . Gas/fluid exchange  09/30/2011    Procedure: GAS/FLUID EXCHANGE;  Surgeon: [name], MD;  Location: [place];  Service: Ophthalmology;  Laterality: Right;  . Gas insertion  09/30/2011    Procedure: INSERTION OF GAS;  Surgeon: [name], MD;  Location: [place];  Service: Ophthalmology;  Laterality: Right;  [id number]  . Eye surgery      right  . Pars plana vitrectomy  02/24/2012    Procedure: PARS PLANA VITRECTOMY WITH 25 GAUGE;  Surgeon: [name], MD;  Location: [place];  Service: Ophthalmology;  Laterality: Left;  . Silicon oil removal  3/00/9233    Procedure: SILICON OIL REMOVAL;  Surgeon: [name], MD;  Location: [place];  Service: Ophthalmology;  Laterality: Left;  . Ptca    . Thrombectomy / arteriovenous graft revision    . Cardiac catheterization    . Coronary angioplasty  05/28/2014    stent placement to the mid RCA  . Left heart catheterization with coronary angiogram N/A 05/28/2013    Procedure: LEFT HEART CATHETERIZATION WITH CORONARY ANGIOGRAM;  Surgeon: [name], MD;  Location: [facility] CATH LAB;  Service: Cardiovascular;  Laterality: N/A;    Social History  reports that she quit smoking about 4 years ago. She has never used smokeless tobacco. She reports that she does not drink alcohol or use illicit drugs.  Family History family history includes Breast cancer in her sister; Colon cancer in her father and sister; Diabetes in her brother, brother, and daughter; Heart attack in her brother and mother; Heart disease in her mother; Stroke in her brother and mother. There is no history of Anesthesia problems, Hypotension, Malignant hyperthermia, or Pseudochol deficiency.   Review of Systems  Constitutional: Negative.   Respiratory: Negative.   Cardiovascular: Negative.   Gastrointestinal: Negative.   Endocrine: Negative.   Musculoskeletal: Negative.   Skin: Negative.   Allergic/Immunologic: Negative.   Neurological: Negative.   Hematological: Negative.   Psychiatric/Behavioral: Negative.   All other systems reviewed and are negative.  BP 140/70 mmHg  Pulse 80  Ht 5' 7.5" (1.715 m)  Wt 169 lb 4 oz (76.771 kg)  BMI 26.10 kg/m2  Physical Exam  Constitutional: She is oriented to person, place, and time. She appears well-developed and well-nourished.  AV graft on her left arm  HENT:  Head: Normocephalic.  Nose: Nose normal.  Mouth/Throat: Oropharynx is clear and moist.  Eyes: Conjunctivae are normal. Pupils are equal, round, and reactive to light.  Neck: Normal range of motion. Neck supple. No JVD present. Carotid bruit is present.  Cardiovascular:  Normal rate, regular rhythm, S1 normal, S2 normal and intact distal pulses.  Exam reveals no gallop and no friction rub.   Murmur heard.  Crescendo systolic murmur is present with a grade of 2/6  Pulmonary/Chest: Effort normal and breath sounds normal. No respiratory distress. She has no wheezes. She has no rales. She exhibits no tenderness.  Abdominal: Soft.  Bowel sounds are normal. She exhibits no distension. There is no tenderness.  Musculoskeletal: Normal range of motion. She exhibits no edema or tenderness.  Lymphadenopathy:    She has no cervical adenopathy.  Neurological: She is alert and oriented to person, place, and time. Coordination normal.  Skin: Skin is warm and dry. No rash noted. No erythema.  Psychiatric: She has a normal mood and affect. Her behavior is normal. Judgment and thought content normal.    Assessment and Plan  Nursing note and vitals reviewed.

## 2015-05-01 NOTE — Assessment & Plan Note (Signed)
Dialysis 3 days per week, continues to make some urine  Follow-up Dr. Candiss Norse

## 2015-05-01 NOTE — Assessment & Plan Note (Addendum)
Stress test at [facility] showing reversible ischemia concerning for blockage , in the RCA distribution  We'll schedule cardiac catheterization next week as the lab is closed for thingsgiving this week  Will likely plan for catheterization Thursday afternoon  December 2 December 1,  We will keep her inpatient if stent is needed with discharge on Friday  hemodialysis later on Friday afternoon at 2 PM Risk  And benefit discussed with her including risk of stroke and heart attack,  She  Is aware and would like to proceed with the catheterization next week  lab work and chest x-ray have been ordered today

## 2015-05-01 NOTE — Patient Instructions (Addendum)
For your positive stress test We will schedule a cardiac cath next Thursday Dec 1st  Please call us if you have new issues that need to be addressed before your next appt.  Your physician wants you to follow-up pending cath results  Theresa [name] are scheduled for a Cardiac Cath on:____Thursday, Dec 1_________  Please arrive at __12:30 pm___ on the day of your procedure  You will need to pre-register prior to the day of your procedure.  Enter through the [employer] at [facility].  Registration is the first desk on your right.  Please take the procedure order we have given you in order to be registered appropriately  You may eat a light, early breakfast  Take 1/2 your normal insulin am dose  Someone will need to drive you home  It is recommended someone be with you for the first 24 hours after your procedure  Wear clothes that are easy to get on/off and wear slip on shoes if possible  Medications bring a current list of all medications with you  Day of your procedure: Arrive at the [place] entrance.  Free valet service is available.  After entering the [place] please check-in at the registration desk (1st desk on your right) to receive your armband. After receiving your armband someone will escort you to the cardiac cath/special procedures waiting area.  The usual length of stay after your procedure is about 2 to 3 hours.  This can vary.  If you have any questions, please call our office at [phone], or you may call the cardiac cath lab at [facility] directly at [phone]   Angiogram An angiogram, also called angiography, is a procedure used to look at the blood vessels. In this procedure, dye is injected through a long, thin tube (catheter) into an artery. X-rays are then taken. The X-rays will show if there is a blockage or problem in a blood vessel.  LET [facility] CARE PROVIDER KNOW ABOUT:  Any allergies you have, including allergies to  shellfish or contrast dye.   All medicines you are taking, including vitamins, herbs, eye drops, creams, and over-the-counter medicines.   Previous problems you or members of your family have had with the use of anesthetics.   Any blood disorders you have.   Previous surgeries you have had.  Any previous kidney problems or failure you have had.  Medical conditions you have.   Possibility of pregnancy, if this applies. RISKS AND COMPLICATIONS Generally, an angiogram is a safe procedure. However, as with any procedure, problems can occur. Possible problems include:  Injury to the blood vessels, including rupture or bleeding.  Infection or bruising at the catheter site.  Allergic reaction to the dye or contrast used.  Kidney damage from the dye or contrast used.  Blood clots that can lead to a stroke or heart attack. BEFORE THE PROCEDURE  Do not eat or drink after midnight on the night before the procedure, or as directed by your health care provider.   Ask your health care provider if you may drink enough water to take any needed medicines the morning of the procedure.  PROCEDURE  You may be given a medicine to help you relax (sedative) before and during the procedure. This medicine is given through an IV access tube that is inserted into one of your veins.   The area where the catheter will be inserted will be washed and shaved. This is usually done in the groin  but may be done in the fold of your arm (near your elbow) or in the wrist.  A medicine will be given to numb the area where the catheter will be inserted (local anesthetic).  The catheter will be inserted with a guide wire into an artery. The catheter is guided by using a type of X-ray (fluoroscopy) to the blood vessel being examined.   Dye is then injected into the catheter, and X-rays are taken. The dye helps to show where any narrowing or blockages are located.  AFTER THE PROCEDURE   If the procedure  is done through the leg, you will be kept in bed lying flat for several hours. You will be instructed to not bend or cross your legs.  The insertion site will be checked frequently.  The pulse in your feet or wrist will be checked frequently.  Additional blood tests, X-rays, and electrocardiography may be done.   You may need to stay in the hospital overnight for observation.    This information is not intended to replace advice given to you by your health care provider. Make sure you discuss any questions you have with your health care provider.   Document Released: 03/05/2005 Document Revised: 06/16/2014 Document Reviewed: 10/27/2012 Elsevier Interactive Patient Education 2016 [place] After Refer to this sheet in the next few weeks. These instructions provide you with information about caring for yourself after your procedure. Your health care provider may also give you more specific instructions. Your treatment has been planned according to current medical practices, but problems sometimes occur. Call your health care provider if you have any problems or questions after your procedure. WHAT TO EXPECT AFTER THE PROCEDURE After your procedure, it is typical to have the following:  Bruising at the catheter insertion site that usually fades within 1-2 weeks.  Blood collecting in the tissue (hematoma) that may be painful to the touch. It should usually decrease in size and tenderness within 1-2 weeks. HOME CARE INSTRUCTIONS  Take medicines only as directed by your health care provider.  You may shower 24-48 hours after the procedure or as directed by your health care provider. Remove the bandage (dressing) and gently wash the site with plain soap and water. Pat the area dry with a clean towel. Do not rub the site, because this may cause bleeding.  Do not take baths, swim, or use a hot tub until your health care provider approves.  Check your insertion site every  day for redness, swelling, or drainage.  Do not apply powder or lotion to the site.  Do not lift over 10 lb (4.5 kg) for 5 days after your procedure or as directed by your health care provider.  Ask your health care provider when it is okay to:  Return to work or school.  Resume usual physical activities or sports.  Resume sexual activity.  Do not drive home if you are discharged the same day as the procedure. Have someone else drive you.  You may drive 24 hours after the procedure unless otherwise instructed by your health care provider.  Do not operate machinery or power tools for 24 hours after the procedure or as directed by your health care provider.  If your procedure was done as an outpatient procedure, which means that you went home the same day as your procedure, a responsible adult should be with you for the first 24 hours after you arrive home.  Keep all follow-up visits as directed by your health  care provider. This is important. SEEK MEDICAL CARE IF:  You have a fever.  You have chills.  You have increased bleeding from the catheter insertion site. Hold pressure on the site. SEEK IMMEDIATE MEDICAL CARE IF:  You have unusual pain at the catheter insertion site.  You have redness, warmth, or swelling at the catheter insertion site.  You have drainage (other than a small amount of blood on the dressing) from the catheter insertion site.  The catheter insertion site is bleeding, and the bleeding does not stop after 30 minutes of holding steady pressure on the site.  The area near or just beyond the catheter insertion site becomes pale, cool, tingly, or numb.   This information is not intended to replace advice given to you by your health care provider. Make sure you discuss any questions you have with your health care provider.   Document Released: 12/12/2004 Document Revised: 06/16/2014 Document Reviewed: 10/27/2012 Elsevier Interactive Patient Education [employer]

## 2015-05-01 NOTE — Assessment & Plan Note (Signed)
Hemoglobin A1c is well controlled in the 5 range

## 2015-05-01 NOTE — Assessment & Plan Note (Signed)
History of coronary artery disease, stent to her right  Interestingly stress test showing perfusion defect of the RCA territory  catheterization scheduled

## 2015-05-02 ENCOUNTER — Ambulatory Visit: Admission: RE | Admit: 2015-05-02 | Payer: Medicare Other | Source: Ambulatory Visit | Admitting: Cardiovascular Disease

## 2015-05-02 ENCOUNTER — Observation Stay (HOSPITAL_BASED_OUTPATIENT_CLINIC_OR_DEPARTMENT_OTHER)
Admission: EM | Admit: 2015-05-02 | Discharge: 2015-05-04 | Disposition: A | Discharge status: Home / Self Care | Payer: Medicare Other | Source: Home / Self Care | Attending: Internal Medicine | Admitting: Internal Medicine

## 2015-05-02 ENCOUNTER — Encounter
Admission: EM | Disposition: A | Discharge status: Home / Self Care | Payer: Self-pay | Source: Home / Self Care | Attending: Internal Medicine

## 2015-05-02 ENCOUNTER — Telehealth: Payer: Self-pay

## 2015-05-02 ENCOUNTER — Emergency Department: Payer: Medicare Other

## 2015-05-02 DIAGNOSIS — J90 Pleural effusion, not elsewhere classified: Secondary | ICD-10-CM | POA: Insufficient documentation

## 2015-05-02 DIAGNOSIS — Z8249 Family history of ischemic heart disease and other diseases of the circulatory system: Secondary | ICD-10-CM | POA: Insufficient documentation

## 2015-05-02 DIAGNOSIS — Z803 Family history of malignant neoplasm of breast: Secondary | ICD-10-CM | POA: Insufficient documentation

## 2015-05-02 DIAGNOSIS — N186 End stage renal disease: Secondary | ICD-10-CM | POA: Insufficient documentation

## 2015-05-02 DIAGNOSIS — I5033 Acute on chronic diastolic (congestive) heart failure: Secondary | ICD-10-CM | POA: Diagnosis not present

## 2015-05-02 DIAGNOSIS — I5032 Chronic diastolic (congestive) heart failure: Secondary | ICD-10-CM | POA: Insufficient documentation

## 2015-05-02 DIAGNOSIS — I151 Hypertension secondary to other renal disorders: Secondary | ICD-10-CM

## 2015-05-02 DIAGNOSIS — R079 Chest pain, unspecified: Secondary | ICD-10-CM

## 2015-05-02 DIAGNOSIS — J189 Pneumonia, unspecified organism: Secondary | ICD-10-CM

## 2015-05-02 DIAGNOSIS — Z9889 Other specified postprocedural states: Secondary | ICD-10-CM

## 2015-05-02 DIAGNOSIS — I110 Hypertensive heart disease with heart failure: Secondary | ICD-10-CM | POA: Diagnosis not present

## 2015-05-02 DIAGNOSIS — I252 Old myocardial infarction: Secondary | ICD-10-CM

## 2015-05-02 DIAGNOSIS — R0982 Postnasal drip: Secondary | ICD-10-CM

## 2015-05-02 DIAGNOSIS — D631 Anemia in chronic kidney disease: Secondary | ICD-10-CM

## 2015-05-02 DIAGNOSIS — E785 Hyperlipidemia, unspecified: Secondary | ICD-10-CM | POA: Insufficient documentation

## 2015-05-02 DIAGNOSIS — R0602 Shortness of breath: Secondary | ICD-10-CM

## 2015-05-02 DIAGNOSIS — N2581 Secondary hyperparathyroidism of renal origin: Secondary | ICD-10-CM | POA: Diagnosis not present

## 2015-05-02 DIAGNOSIS — R05 Cough: Secondary | ICD-10-CM | POA: Insufficient documentation

## 2015-05-02 DIAGNOSIS — J9811 Atelectasis: Secondary | ICD-10-CM | POA: Insufficient documentation

## 2015-05-02 DIAGNOSIS — Z833 Family history of diabetes mellitus: Secondary | ICD-10-CM

## 2015-05-02 DIAGNOSIS — Z90710 Acquired absence of both cervix and uterus: Secondary | ICD-10-CM

## 2015-05-02 DIAGNOSIS — I340 Nonrheumatic mitral (valve) insufficiency: Secondary | ICD-10-CM | POA: Insufficient documentation

## 2015-05-02 DIAGNOSIS — Z7982 Long term (current) use of aspirin: Secondary | ICD-10-CM

## 2015-05-02 DIAGNOSIS — E1140 Type 2 diabetes mellitus with diabetic neuropathy, unspecified: Secondary | ICD-10-CM | POA: Insufficient documentation

## 2015-05-02 DIAGNOSIS — R51 Headache: Secondary | ICD-10-CM

## 2015-05-02 DIAGNOSIS — Z992 Dependence on renal dialysis: Secondary | ICD-10-CM

## 2015-05-02 DIAGNOSIS — E11319 Type 2 diabetes mellitus with unspecified diabetic retinopathy without macular edema: Secondary | ICD-10-CM | POA: Insufficient documentation

## 2015-05-02 DIAGNOSIS — I25110 Atherosclerotic heart disease of native coronary artery with unstable angina pectoris: Secondary | ICD-10-CM

## 2015-05-02 DIAGNOSIS — I132 Hypertensive heart and chronic kidney disease with heart failure and with stage 5 chronic kidney disease, or end stage renal disease: Secondary | ICD-10-CM | POA: Diagnosis not present

## 2015-05-02 DIAGNOSIS — Z8701 Personal history of pneumonia (recurrent): Secondary | ICD-10-CM | POA: Insufficient documentation

## 2015-05-02 DIAGNOSIS — I2699 Other pulmonary embolism without acute cor pulmonale: Secondary | ICD-10-CM

## 2015-05-02 DIAGNOSIS — I214 Non-ST elevation (NSTEMI) myocardial infarction: Secondary | ICD-10-CM

## 2015-05-02 DIAGNOSIS — I10 Essential (primary) hypertension: Secondary | ICD-10-CM

## 2015-05-02 DIAGNOSIS — Z86010 Personal history of colonic polyps: Secondary | ICD-10-CM | POA: Insufficient documentation

## 2015-05-02 DIAGNOSIS — Z87891 Personal history of nicotine dependence: Secondary | ICD-10-CM | POA: Insufficient documentation

## 2015-05-02 DIAGNOSIS — J9601 Acute respiratory failure with hypoxia: Secondary | ICD-10-CM | POA: Diagnosis not present

## 2015-05-02 DIAGNOSIS — I6523 Occlusion and stenosis of bilateral carotid arteries: Secondary | ICD-10-CM | POA: Insufficient documentation

## 2015-05-02 DIAGNOSIS — R0609 Other forms of dyspnea: Secondary | ICD-10-CM | POA: Insufficient documentation

## 2015-05-02 DIAGNOSIS — Z823 Family history of stroke: Secondary | ICD-10-CM

## 2015-05-02 DIAGNOSIS — Z79899 Other long term (current) drug therapy: Secondary | ICD-10-CM

## 2015-05-02 DIAGNOSIS — R9439 Abnormal result of other cardiovascular function study: Secondary | ICD-10-CM | POA: Insufficient documentation

## 2015-05-02 DIAGNOSIS — Z955 Presence of coronary angioplasty implant and graft: Secondary | ICD-10-CM | POA: Insufficient documentation

## 2015-05-02 DIAGNOSIS — I739 Peripheral vascular disease, unspecified: Secondary | ICD-10-CM | POA: Insufficient documentation

## 2015-05-02 DIAGNOSIS — D573 Sickle-cell trait: Secondary | ICD-10-CM | POA: Insufficient documentation

## 2015-05-02 DIAGNOSIS — E1122 Type 2 diabetes mellitus with diabetic chronic kidney disease: Secondary | ICD-10-CM | POA: Insufficient documentation

## 2015-05-02 DIAGNOSIS — E119 Type 2 diabetes mellitus without complications: Secondary | ICD-10-CM | POA: Diagnosis not present

## 2015-05-02 DIAGNOSIS — I509 Heart failure, unspecified: Secondary | ICD-10-CM | POA: Diagnosis not present

## 2015-05-02 DIAGNOSIS — I25111 Atherosclerotic heart disease of native coronary artery with angina pectoris with documented spasm: Secondary | ICD-10-CM | POA: Insufficient documentation

## 2015-05-02 DIAGNOSIS — I272 Other secondary pulmonary hypertension: Secondary | ICD-10-CM | POA: Insufficient documentation

## 2015-05-02 DIAGNOSIS — J449 Chronic obstructive pulmonary disease, unspecified: Secondary | ICD-10-CM | POA: Insufficient documentation

## 2015-05-02 DIAGNOSIS — Z800 Family history of malignant neoplasm of digestive organs: Secondary | ICD-10-CM

## 2015-05-02 DIAGNOSIS — E1151 Type 2 diabetes mellitus with diabetic peripheral angiopathy without gangrene: Secondary | ICD-10-CM | POA: Insufficient documentation

## 2015-05-02 DIAGNOSIS — N2889 Other specified disorders of kidney and ureter: Secondary | ICD-10-CM | POA: Insufficient documentation

## 2015-05-02 DIAGNOSIS — I200 Unstable angina: Secondary | ICD-10-CM | POA: Insufficient documentation

## 2015-05-02 DIAGNOSIS — Z794 Long term (current) use of insulin: Secondary | ICD-10-CM | POA: Insufficient documentation

## 2015-05-02 DIAGNOSIS — R519 Headache, unspecified: Secondary | ICD-10-CM | POA: Insufficient documentation

## 2015-05-02 DIAGNOSIS — Z9289 Personal history of other medical treatment: Secondary | ICD-10-CM

## 2015-05-02 DIAGNOSIS — R0989 Other specified symptoms and signs involving the circulatory and respiratory systems: Secondary | ICD-10-CM | POA: Insufficient documentation

## 2015-05-02 HISTORY — PX: CARDIAC CATHETERIZATION: SHX172

## 2015-05-02 LAB — TROPONIN I: Troponin I: 0.05 ng/mL — ABNORMAL HIGH (ref <0.031)

## 2015-05-02 LAB — BASIC METABOLIC PANEL
ANION GAP: 9 (ref 5–15)
BUN: 35 mg/dL — ABNORMAL HIGH (ref 6–20)
CHLORIDE: 100 mmol/L — AB (ref 101–111)
CO2: 32 mmol/L (ref 22–32)
CREATININE: 6.8 mg/dL — AB (ref 0.44–1.00)
Calcium: 8.4 mg/dL — ABNORMAL LOW (ref 8.9–10.3)
GFR calc non Af Amer: 6 mL/min — ABNORMAL LOW (ref >60)
GFR, EST AFRICAN AMERICAN: 7 mL/min — AB (ref >60)
Glucose, Bld: 116 mg/dL — ABNORMAL HIGH (ref 65–99)
POTASSIUM: 4.4 mmol/L (ref 3.5–5.1)
SODIUM: 141 mmol/L (ref 135–145)

## 2015-05-02 LAB — GLUCOSE, CAPILLARY
GLUCOSE-CAPILLARY: 101 mg/dL — AB (ref 65–99)
Glucose-Capillary: 152 mg/dL — ABNORMAL HIGH (ref 65–99)

## 2015-05-02 LAB — CBC
HEMATOCRIT: 28.1 % — AB (ref 35.0–47.0)
HEMOGLOBIN: 9.4 g/dL — AB (ref 12.0–16.0)
MCH: 35.2 pg — AB (ref 26.0–34.0)
MCHC: 33.6 g/dL (ref 32.0–36.0)
MCV: 104.6 fL — AB (ref 80.0–100.0)
Platelets: 153 10*3/uL (ref 150–440)
RBC: 2.69 MIL/uL — AB (ref 3.80–5.20)
RDW: 17.1 % — ABNORMAL HIGH (ref 11.5–14.5)
WBC: 9.1 10*3/uL (ref 3.6–11.0)

## 2015-05-02 LAB — BRAIN NATRIURETIC PEPTIDE: B Natriuretic Peptide: 703 pg/mL — ABNORMAL HIGH (ref 0.0–100.0)

## 2015-05-02 LAB — PROTIME-INR
INR: 1.17
PROTHROMBIN TIME: 15.1 s — AB (ref 11.4–15.0)

## 2015-05-02 LAB — APTT: APTT: 35 s (ref 24–36)

## 2015-05-02 SURGERY — LEFT HEART CATH AND CORONARY ANGIOGRAPHY
Anesthesia: Moderate Sedation

## 2015-05-02 MED ORDER — CALCIUM ACETATE (PHOS BINDER) 667 MG PO CAPS
1334.0000 mg | ORAL_CAPSULE | Freq: Two times a day (BID) | ORAL | Status: DC
  Administered 2015-05-03: 1334 mg via ORAL
  Filled 2015-05-02: qty 2

## 2015-05-02 MED ORDER — LABETALOL HCL 5 MG/ML IV SOLN
INTRAVENOUS | Status: DC | PRN
Start: 2015-05-02 — End: 2015-05-02
  Administered 2015-05-02: 20 mg via INTRAVENOUS

## 2015-05-02 MED ORDER — HEPARIN (PORCINE) IN NACL 2-0.9 UNIT/ML-% IJ SOLN
INTRAMUSCULAR | Status: AC
  Filled 2015-05-02: qty 1000

## 2015-05-02 MED ORDER — NITROGLYCERIN 0.4 MG SL SUBL: 0.4000 mg | SUBLINGUAL_TABLET | SUBLINGUAL | Status: DC | PRN

## 2015-05-02 MED ORDER — AMLODIPINE BESYLATE 5 MG PO TABS
5.0000 mg | ORAL_TABLET | Freq: Every day | ORAL | Status: DC
  Administered 2015-05-02 – 2015-05-03 (×2): 5 mg via ORAL
  Filled 2015-05-02 (×2): qty 1

## 2015-05-02 MED ORDER — CALCIUM ACETATE 667 MG PO CAPS: 667.0000 mg | ORAL_CAPSULE | Freq: Every day | ORAL | Status: DC

## 2015-05-02 MED ORDER — LIDOCAINE HCL (PF) 1 % IJ SOLN
INTRAMUSCULAR | Status: DC | PRN
  Administered 2015-05-02: 15:00:00

## 2015-05-02 MED ORDER — HYDRALAZINE HCL 20 MG/ML IJ SOLN
INTRAMUSCULAR | Status: DC | PRN
  Administered 2015-05-02 (×2): 10 mg via INTRAVENOUS

## 2015-05-02 MED ORDER — ACETAMINOPHEN 325 MG PO TABS: 650.0000 mg | ORAL_TABLET | Freq: Four times a day (QID) | ORAL | Status: DC | PRN

## 2015-05-02 MED ORDER — INSULIN DETEMIR 100 UNIT/ML ~~LOC~~ SOLN
5.0000 [IU] | Freq: Every day | SUBCUTANEOUS | Status: DC
  Administered 2015-05-03 – 2015-05-04 (×2): 5 [IU] via SUBCUTANEOUS
  Filled 2015-05-02 (×3): qty 0.05

## 2015-05-02 MED ORDER — ACETAMINOPHEN 500 MG PO TABS
500.0000 mg | ORAL_TABLET | Freq: Four times a day (QID) | ORAL | Status: DC | PRN
  Administered 2015-05-02 – 2015-05-04 (×5): 500 mg via ORAL
  Filled 2015-05-02 (×4): qty 1

## 2015-05-02 MED ORDER — SODIUM CHLORIDE 0.9 % IV SOLN: 250.0000 mL | INTRAVENOUS | Status: DC | PRN

## 2015-05-02 MED ORDER — ASPIRIN EC 81 MG PO TBEC
81.0000 mg | DELAYED_RELEASE_TABLET | Freq: Every day | ORAL | Status: DC
  Administered 2015-05-03 – 2015-05-04 (×2): 81 mg via ORAL
  Filled 2015-05-02 (×2): qty 1

## 2015-05-02 MED ORDER — LABETALOL HCL 5 MG/ML IV SOLN
INTRAVENOUS | Status: AC
  Filled 2015-05-02: qty 4

## 2015-05-02 MED ORDER — CARVEDILOL 25 MG PO TABS
25.0000 mg | ORAL_TABLET | Freq: Two times a day (BID) | ORAL | Status: DC
  Administered 2015-05-02 – 2015-05-04 (×4): 25 mg via ORAL
  Filled 2015-05-02 (×4): qty 1

## 2015-05-02 MED ORDER — ASPIRIN 81 MG PO CHEW
324.0000 mg | CHEWABLE_TABLET | Freq: Once | ORAL | Status: AC
Start: 2015-05-02 — End: 2015-05-02
  Administered 2015-05-02: 324 mg via ORAL
  Filled 2015-05-02: qty 4

## 2015-05-02 MED ORDER — CLONIDINE HCL 0.1 MG PO TABS
0.1000 mg | ORAL_TABLET | Freq: Two times a day (BID) | ORAL | Status: DC
  Administered 2015-05-02 – 2015-05-04 (×4): 0.1 mg via ORAL
  Filled 2015-05-02 (×4): qty 1

## 2015-05-02 MED ORDER — INSULIN ASPART 100 UNIT/ML ~~LOC~~ SOLN
0.0000 [IU] | Freq: Three times a day (TID) | SUBCUTANEOUS | Status: DC
  Administered 2015-05-03: 3 [IU] via SUBCUTANEOUS

## 2015-05-02 MED ORDER — SODIUM CHLORIDE 0.9 % IJ SOLN
3.0000 mL | INTRAMUSCULAR | Status: DC | PRN
Start: 2015-05-02 — End: 2015-05-04

## 2015-05-02 MED ORDER — FLUTICASONE PROPIONATE 50 MCG/ACT NA SUSP
1.0000 | Freq: Every day | NASAL | Status: DC
  Administered 2015-05-03 – 2015-05-04 (×2): 1 via NASAL
  Filled 2015-05-02: qty 16

## 2015-05-02 MED ORDER — PREGABALIN 75 MG PO CAPS
75.0000 mg | ORAL_CAPSULE | Freq: Every day | ORAL | Status: DC
  Administered 2015-05-02 – 2015-05-04 (×3): 75 mg via ORAL
  Filled 2015-05-02 (×3): qty 1

## 2015-05-02 MED ORDER — MIDAZOLAM HCL 2 MG/2ML IJ SOLN
INTRAMUSCULAR | Status: DC | PRN
  Administered 2015-05-02: 1 mg via INTRAVENOUS

## 2015-05-02 MED ORDER — IOHEXOL 300 MG/ML  SOLN
INTRAMUSCULAR | Status: DC | PRN
  Administered 2015-05-02: 100 mL via INTRA_ARTERIAL

## 2015-05-02 MED ORDER — HYDRALAZINE HCL 20 MG/ML IJ SOLN
INTRAMUSCULAR | Status: AC
  Filled 2015-05-02: qty 1

## 2015-05-02 MED ORDER — MIDAZOLAM HCL 2 MG/2ML IJ SOLN
INTRAMUSCULAR | Status: AC
  Filled 2015-05-02: qty 2

## 2015-05-02 MED ORDER — ACETAMINOPHEN 500 MG PO TABS
ORAL_TABLET | ORAL | Status: AC
  Filled 2015-05-02: qty 1

## 2015-05-02 MED ORDER — ALBUTEROL SULFATE (2.5 MG/3ML) 0.083% IN NEBU
3.0000 mL | INHALATION_SOLUTION | Freq: Four times a day (QID) | RESPIRATORY_TRACT | Status: DC | PRN
Start: 2015-05-02 — End: 2015-05-04
  Administered 2015-05-03 – 2015-05-04 (×2): 3 mL via RESPIRATORY_TRACT
  Filled 2015-05-02 (×2): qty 3

## 2015-05-02 MED ORDER — ROSUVASTATIN CALCIUM 20 MG PO TABS
40.0000 mg | ORAL_TABLET | Freq: Every day | ORAL | Status: DC
  Administered 2015-05-02 – 2015-05-04 (×3): 40 mg via ORAL
  Filled 2015-05-02 (×3): qty 2

## 2015-05-02 MED ORDER — SODIUM CHLORIDE 0.9 % IJ SOLN
3.0000 mL | Freq: Two times a day (BID) | INTRAMUSCULAR | Status: DC
  Administered 2015-05-02 – 2015-05-03 (×2): 3 mL via INTRAVENOUS

## 2015-05-02 MED ORDER — FENTANYL CITRATE (PF) 100 MCG/2ML IJ SOLN
INTRAMUSCULAR | Status: AC
  Filled 2015-05-02: qty 2

## 2015-05-02 MED ORDER — CALCIUM ACETATE (PHOS BINDER) 667 MG PO CAPS
2668.0000 mg | ORAL_CAPSULE | Freq: Three times a day (TID) | ORAL | Status: DC
  Administered 2015-05-03 (×3): 2668 mg via ORAL
  Filled 2015-05-02 (×3): qty 4

## 2015-05-02 MED ORDER — FENTANYL CITRATE (PF) 100 MCG/2ML IJ SOLN
INTRAMUSCULAR | Status: DC | PRN
  Administered 2015-05-02 (×2): 25 ug via INTRAVENOUS

## 2015-05-02 MED ORDER — ASPIRIN 81 MG PO CHEW
CHEWABLE_TABLET | ORAL | Status: AC
  Filled 2015-05-02: qty 1

## 2015-05-02 SURGICAL SUPPLY — 11 items
CATH INFINITI 5FR ANG PIGTAIL (CATHETERS) ×2 IMPLANT
CATH INFINITI 5FR JL4 (CATHETERS) ×2 IMPLANT
CATH INFINITI JR4 5F (CATHETERS) ×2 IMPLANT
DEVICE CLOSURE MYNXGRIP 5F (Vascular Products) ×2 IMPLANT
GLIDESHEATH SLEND SS 6F .021 (SHEATH) IMPLANT
KIT MANI 3VAL PERCEP (MISCELLANEOUS) ×2 IMPLANT
NEEDLE PERC 18GX7CM (NEEDLE) ×2 IMPLANT
PACK CARDIAC CATH (CUSTOM PROCEDURE TRAY) ×2 IMPLANT
SHEATH AVANTI 5FR X 11CM (SHEATH) ×2 IMPLANT
WIRE EMERALD 3MM-J .035X150CM (WIRE) ×2 IMPLANT
WIRE SAFE-T 1.5MM-J .035X260CM (WIRE) IMPLANT

## 2015-05-02 NOTE — Interval H&P Note (Signed)
History and Physical Interval Note:  05/02/2015 2:57 PM  Theresa [name]  has presented today for surgery, with the diagnosis of Abn Stress Test  The various methods of treatment have been discussed with the patient and family. After consideration of risks, benefits and other options for treatment, the patient has consented to  Procedure(s): Left Heart Cath and Coronary Angiography (N/A) as a surgical intervention .  The patient's history has been reviewed, patient examined, no change in status, stable for surgery.  I have reviewed the patient's chart and labs.  Questions were answered to the patient's satisfaction.     Kathlyn [place]

## 2015-05-02 NOTE — ED Provider Notes (Addendum)
[facility] Emergency Department Provider Note  ____________________________________________  Time seen: 12:15 PM  I have reviewed the triage vital signs and the nursing notes.   HISTORY  Chief Complaint Shortness of Breath    HPI Theresa [name] is a 59 y.o. female who complains of shortness of breath for the last 2-3 days. It is worse with walking and exertion. Denies chest pain. Also complains of increasing shortness of breath when lying flat at night. No travel, hospitalizations or surgeries. She states this feels like similar symptoms to her prior MI. She has stents but was taken off Plavix in the past. She was advised to come to the emergency department by her cardiologist Dr. Rockey Situ.     Past Medical History  Diagnosis Date  . Hypertension   . ESRD on hemodialysis ([place])     a. [facility] in [place], [place], on a TTS schedule.  She started dialysis in Feb 2014.  Etiology of renal failure not known, likely diabetes.  Has a left upper arm AV graft.  Marland Kitchen COPD (chronic obstructive pulmonary disease) ([place])   . Emphysema   . History of pneumonia     June 2012  . History of bronchitis     Mar 2012  . Peripheral vascular disease ([place])   . Chronic constipation   . Colon polyps   . Sickle cell trait ([place])   . Anemia of chronic disease   . Diabetes mellitus   . Moderate mitral insufficiency     a. 10/2013 Echo: EF 55-60%, mod MR.  . Diabetic neuropathy ([place])   . Diabetic retinopathy ([place]) 05/28/2013    Hx bilat retinal detachment, proliferative diab retinopathy and bilat vitreous hemorrhage   . Hyperlipidemia   . Coronary artery disease     a. 05/2013 NSTEMI/PCI: LM 20d, LAD min irregs, LCX small, nl, OM1 nl, RCA dom 59m(2.5x16 Promus DES), PDA1 80p.  . Chronic diastolic CHF (congestive heart failure) ([place]     a. 10/2013 Echo ([facility]: EF 55-60%, mod conc LVH, mod MR, mildly dil LA, mild Ao sclerosis w/o stenosis.  . Carotid arterial disease ([place]     a.  02/2013 U/S: 40-59% bilat ICA stenosis - *f/u 02/2014*  . History of tobacco abuse     a. Quit 2012.     Patient Active Problem List   Diagnosis Date Noted  . Positive cardiac stress test 05/01/2015  . Right ankle pain 03/05/2015  . Neuritis or radiculitis due to rupture of lumbar intervertebral disc 11/28/2014  . Acute CVA (cerebrovascular accident) ([place] 11/23/2014  . Right hip pain 06/13/2014  . Other malaise and fatigue 02/21/2014  . Tachycardia 01/26/2014  . Routine general medical examination at a health care facility 09/02/2013  . Osteoarthritis of right hip 08/23/2013  . DDD (degenerative disc disease), lumbosacral 08/23/2013  . Coronary artery disease 06/27/2013  . NSTEMI (non-ST elevated myocardial infarction) ([place] 05/30/2013  . Diabetic retinopathy ([place] 05/28/2013  . Hyperlipidemia   . End stage kidney disease ([place] 11/08/2012  . Essential (primary) hypertension 11/08/2012  . Diabetic neuropathy ([place] 02/18/2012  . Anemia in chronic kidney disease (CKD) 02/18/2012  . ESRD on hemodialysis ([place] 08/21/2011  . Diabetes mellitus, type II ([place] 06/30/2011  . COPD (chronic obstructive pulmonary disease) ([place] 06/13/2011     Past Surgical History  Procedure Laterality Date  . Abdominal hysterectomy      2000  . Dilation and curettage of uterus      several in the early 80's  .  Eye surgery      bilateral laser 2012  . Esophagogastroduodenoscopy      2012  . Colonscopy    . Tubal ligation      1979  . Pars plana vitrectomy  04/22/2011    Procedure: PARS PLANA VITRECTOMY WITH 25 GAUGE;  Surgeon: [name], MD;  Location: [place];  Service: Ophthalmology;  Laterality: Left;  membrane peel, endolaser, gas fluid exchange, silicone oil, repair of complex traction retinal detachment  . Pars plana vitrectomy  09/30/2011    Procedure: PARS PLANA VITRECTOMY WITH 25 GAUGE;  Surgeon: [name], MD;  Location: [place];  Service: Ophthalmology;  Laterality: Right;   Endolaser; Repair of Complex Traction Retinal Detachment  . Gas/fluid exchange  09/30/2011    Procedure: GAS/FLUID EXCHANGE;  Surgeon: [name], MD;  Location: [place];  Service: Ophthalmology;  Laterality: Right;  . Gas insertion  09/30/2011    Procedure: INSERTION OF GAS;  Surgeon: [name], MD;  Location: [place];  Service: Ophthalmology;  Laterality: Right;  [id number]  . Eye surgery      right  . Pars plana vitrectomy  02/24/2012    Procedure: PARS PLANA VITRECTOMY WITH 25 GAUGE;  Surgeon: [name], MD;  Location: [place];  Service: Ophthalmology;  Laterality: Left;  . Silicon oil removal  12/11/8887    Procedure: SILICON OIL REMOVAL;  Surgeon: [name], MD;  Location: [place];  Service: Ophthalmology;  Laterality: Left;  . Ptca    . Thrombectomy / arteriovenous graft revision    . Cardiac catheterization    . Coronary angioplasty  05/28/2014    stent placement to the mid RCA  . Left heart catheterization with coronary angiogram N/A 05/28/2013    Procedure: LEFT HEART CATHETERIZATION WITH CORONARY ANGIOGRAM;  Surgeon: [name], MD;  Location: [facility] CATH LAB;  Service: Cardiovascular;  Laterality: N/A;     Current Outpatient Rx  Name  Route  Sig  Dispense  Refill  . albuterol (PROVENTIL HFA;VENTOLIN HFA) 108 (90 BASE) MCG/ACT inhaler   Inhalation   Inhale 2 puffs into the lungs every 6 (six) hours as needed for wheezing or shortness of breath.         Marland Kitchen aspirin EC 81 MG tablet   Oral   Take 81 mg by mouth daily.         Marland Kitchen BAYER CONTOUR TEST test strip      TEST BLOOD SUGARS 2 TIMES DAILY   100 each   PRN   . Blood Glucose Monitoring Suppl (ONE TOUCH ULTRA SYSTEM KIT) W/DEVICE KIT      Please dispense for patient to use at home to check blood sugars. Patient taking differently: 1 kit by Other route See admin instructions. Pt uses to test blood sugars.   1 each   0   . calcium acetate (PHOSLO) 667 MG capsule   Oral   Take 667-2,668 mg by mouth 5  (five) times daily. Pt takes four capsules with meals and two capsules with snacks.         . carvedilol (COREG) 12.5 MG tablet   Oral   Take 1 tablet (12.5 mg total) by mouth 2 (two) times daily.   60 tablet   6   . cloNIDine (CATAPRES) 0.1 MG tablet   Oral   Take 1 tablet (0.1 mg total) by mouth 2 (two) times daily.   60 tablet   11   . Insulin  Detemir (LEVEMIR) 100 UNIT/ML Pen   Subcutaneous   Inject 5 Units into the skin daily.   15 mL   3   . Lancets Thin MISC      Using at home to test blood sugars twice daily. Patient taking differently: 1 each by Other route 2 (two) times daily. Pt uses to test blood sugars.   200 each   6   . mometasone (NASONEX) 50 MCG/ACT nasal spray   Nasal   Place 2 sprays into the nose 2 (two) times daily as needed (for allergies).         . nitroGLYCERIN (NITROSTAT) 0.4 MG SL tablet   Sublingual   Place 1 tablet (0.4 mg total) under the tongue every 5 (five) minutes as needed for chest pain.   25 tablet   3   . NOVOFINE 32G X 6 MM MISC      USE AS DIRECTED   100 each   0   . pregabalin (LYRICA) 75 MG capsule   Oral   Take 1 capsule (75 mg total) by mouth daily.   30 capsule   3   . rosuvastatin (CRESTOR) 40 MG tablet   Oral   Take 1 tablet (40 mg total) by mouth daily.   90 tablet   3      Allergies Metformin and Hydrocodone   Family History  Problem Relation Age of Onset  . Anesthesia problems Neg Hx   . Hypotension Neg Hx   . Malignant hyperthermia Neg Hx   . Pseudochol deficiency Neg Hx   . Stroke Mother   . Heart attack Mother   . Heart disease Mother   . Colon cancer Father   . Colon cancer Sister   . Heart attack Brother   . Stroke Brother   . Diabetes Brother   . Breast cancer Sister   . Diabetes Brother   . Diabetes Daughter     Social History Social History  Substance Use Topics  . Smoking status: Former Smoker -- 1.00 packs/day for 25 years    Quit date: 06/09/2010  . Smokeless tobacco:  Never Used  . Alcohol Use: No    Review of Systems  Constitutional:   No fever or chills. No weight changes Eyes:   No blurry vision or double vision.  ENT:   No sore throat. Cardiovascular:   No chest pain. Respiratory:   Positive dyspnea without cough. Gastrointestinal:   Negative for abdominal pain, vomiting and diarrhea.  No BRBPR or melena. Genitourinary:   Negative for dysuria, urinary retention, bloody urine, or difficulty urinating. Musculoskeletal:   Negative for back pain. No joint swelling or pain. Skin:   Negative for rash. Neurological:   Negative for headaches, focal weakness or numbness. Psychiatric:  No anxiety or depression.   Endocrine:  No hot/cold intolerance, changes in energy, or sleep difficulty.  10-point ROS otherwise negative.  ____________________________________________   PHYSICAL EXAM:  VITAL SIGNS: ED Triage Vitals  Enc Vitals Group     BP 05/02/15 1159 180/65 mmHg     Pulse Rate 05/02/15 1152 78     Resp 05/02/15 1152 17     Temp 05/02/15 1152 98.4 F (36.9 C)     Temp Source 05/02/15 1152 Oral     SpO2 05/02/15 1152 100 %     Weight 05/02/15 1151 168 lb (76.204 kg)     Height 05/02/15 1151 '5\' 7"'  (1.702 m)     Head Cir --  Peak Flow --      Pain Score --      Pain Loc --      Pain Edu? --      Excl. in [place]? --      Constitutional:   Alert and oriented. Well appearing and in no distress. Eyes:   No scleral icterus. No conjunctival pallor. PERRL. EOMI ENT   Head:   Normocephalic and atraumatic.   Nose:   No congestion/rhinnorhea. No septal hematoma   Mouth/Throat:   MMM, no pharyngeal erythema. No peritonsillar mass. No uvula shift.   Neck:   No stridor. No SubQ emphysema. No meningismus. Hematological/Lymphatic/Immunilogical:   No cervical lymphadenopathy. Cardiovascular:   RRR. Normal and symmetric distal pulses are present in all extremities. No murmurs, rubs, or gallops. Respiratory:   Normal respiratory effort  without tachypnea nor retractions. Breath sounds are clear and equal bilaterally. No wheezes/rales/rhonchi. Gastrointestinal:   Soft and nontender. No distention. There is no CVA tenderness.  No rebound, rigidity, or guarding. Genitourinary:   deferred Musculoskeletal:   Nontender with normal range of motion in all extremities. No joint effusions.  No lower extremity tenderness.  No edema. Neurologic:   Normal speech and language.  CN 2-10 normal. Motor grossly intact. No pronator drift.  Normal gait. No gross focal neurologic deficits are appreciated.  Skin:    Skin is warm, dry and intact. No rash noted.  No petechiae, purpura, or bullae. Psychiatric:   Mood and affect are normal. Speech and behavior are normal. Patient exhibits appropriate insight and judgment.  ____________________________________________    LABS (pertinent positives/negatives) (all labs ordered are listed, but only abnormal results are displayed) Labs Reviewed  CBC - Abnormal; Notable for the following:    RBC 2.69 (*)    Hemoglobin 9.4 (*)    HCT 28.1 (*)    MCV 104.6 (*)    MCH 35.2 (*)    RDW 17.1 (*)    All other components within normal limits  BASIC METABOLIC PANEL  TROPONIN I  BRAIN NATRIURETIC PEPTIDE   ____________________________________________   EKG  Interpreted by me Normal sinus rhythm rate of 77, normal axis and intervals. Right bundle-branch block, normal ST segments and T waves.  ____________________________________________    RADIOLOGY  Chest x-ray interpreted by me, unremarkable  ____________________________________________   PROCEDURES CRITICAL CARE Performed by: Joni Fears, Nyeli Holtmeyer   Total critical care time: 35 minutes  Critical care time was exclusive of separately billable procedures and treating other patients.  Critical care was necessary to treat or prevent imminent or life-threatening deterioration.  Critical care was time spent personally by me on the  following activities: development of treatment plan with patient and/or surrogate as well as nursing, discussions with consultants, evaluation of patient's response to treatment, examination of patient, obtaining history from patient or surrogate, ordering and performing treatments and interventions, ordering and review of laboratory studies, ordering and review of radiographic studies, pulse oximetry and re-evaluation of patient's condition.   ____________________________________________   INITIAL IMPRESSION / ASSESSMENT AND PLAN / ED COURSE  Pertinent labs & imaging results that were available during my care of the patient were reviewed by me and considered in my medical decision making (see chart for details).  Patient presents with exertional dyspnea, given severe comorbidities is concerning for ACS. We'll discuss with hospitalist for admission. Indication from the patient's cardiologist is that he is considering a urgent repeat cardiac catheterization today. We'll give full dose aspirin for now. Review of Epic care everywhere  shows that the patient recently had a nuclear medicine myocardial perfusion scan at [facility] which was positive for reversible ischemia in multiple regions. We'll check labs and discussed with cardiology.  ----------------------------------------- 1:08 PM on 05/02/2015 -----------------------------------------  Case was discussed with Dr. Rockey Situ who agrees that if the patient is having severe symptoms we should plan on further inpatient management at this time. After discussed with Dr. Rockey Situ, the remainder of the labs were resulted which do show that the patient has a slight elevation in her troponin, where most recently in June 2016 it was negative. This is consistent with NSTEMI despite her atypical symptoms. Case was discussed with the hospitalist. [facility] plan to admit for medical management and likely urgent  catheterization.     ____________________________________________   FINAL CLINICAL IMPRESSION(S) / ED DIAGNOSES  Final diagnoses:  Dyspnea on exertion   non-ST segment elevation myocardial infarction    Carrie Mew, MD 05/02/15 1241  Carrie Mew, MD 05/02/15 1310

## 2015-05-02 NOTE — Progress Notes (Signed)
Pre-hd tx 

## 2015-05-02 NOTE — H&P (Signed)
[place] [facility] [place] NAME: Theresa [name]    MR#:  [id number]  DATE OF BIRTH:  05/13/1956  DATE OF ADMISSION:  05/02/2015  PRIMARY CARE PHYSICIAN: Rica Mast, MD   REQUESTING/REFERRING PHYSICIAN: Dwana Curd  CHIEF COMPLAINT:   Chief Complaint  Patient presents with  . Shortness of Breath    HISTORY OF PRESENT ILLNESS: Theresa [name]  is a 59 y.o. female with a known history of hypertension, COPD, hyperlipidemia, diabetes, coronary artery disease status post stent in 2014, end-stage renal disease on hemodialysis, diabetic neuropathy, diabetic retinopathy- his one kidney transplant list and so she has to go every year for a stress test with cardiology. Her stress test as a routine was done by Dr. Philmore Pali 2 weeks ago. Yesterday was her regular follow-up appointment with Dr. Rockey Situ, but for last 2 days she started having some chest tightness and some shortness of breath and some cough, and she describes this is exactly similar as she had 2 years ago when she had stents. So when Dr. Armandina Gemma saw her yesterday in the office he told her to come for cardiac catheterization as outpatient on next Thursday. She went home but last night she had again severe pain in the chest and shortness of breath so today morning she called Dr. Katheran Awe office-  He advised to come to ER. Troponin is slightly high- and she is going for cath here.  PAST MEDICAL HISTORY:   Past Medical History  Diagnosis Date  . Hypertension   . ESRD on hemodialysis ([place])     a. [facility] in [place], [place], on a TTS schedule.  She started dialysis in Feb 2014.  Etiology of renal failure not known, likely diabetes.  Has a left upper arm AV graft.  Marland Kitchen COPD (chronic obstructive pulmonary disease) ([place])   . Emphysema   . History of pneumonia     June 2012  . History of bronchitis     Mar 2012  . Peripheral vascular disease ([place])   . Chronic constipation   . Colon polyps   .  Sickle cell trait ([place])   . Anemia of chronic disease   . Diabetes mellitus   . Moderate mitral insufficiency     a. 10/2013 Echo: EF 55-60%, mod MR.  . Diabetic neuropathy ([place])   . Diabetic retinopathy ([place]) 05/28/2013    Hx bilat retinal detachment, proliferative diab retinopathy and bilat vitreous hemorrhage   . Hyperlipidemia   . Coronary artery disease     a. 05/2013 NSTEMI/PCI: LM 20d, LAD min irregs, LCX small, nl, OM1 nl, RCA dom 69m(2.5x16 Promus DES), PDA1 80p.  . Chronic diastolic CHF (congestive heart failure) [place]     a. 10/2013 Echo ([facility]: EF 55-60%, mod conc LVH, mod MR, mildly dil LA, mild Ao sclerosis w/o stenosis.  . Carotid arterial disease ([place]     a. 02/2013 U/S: 40-59% bilat ICA stenosis - *f/u 02/2014*  . History of tobacco abuse     a. Quit 2012.    PAST SURGICAL HISTORY:  Past Surgical History  Procedure Laterality Date  . Abdominal hysterectomy      2000  . Dilation and curettage of uterus      several in the early 80's  . Eye surgery      bilateral laser 2012  . Esophagogastroduodenoscopy      2012  . Colonscopy    . Tubal ligation      1979  .  Pars plana vitrectomy  04/22/2011    Procedure: PARS PLANA VITRECTOMY WITH 25 GAUGE;  Surgeon: [name], MD;  Location: [place];  Service: Ophthalmology;  Laterality: Left;  membrane peel, endolaser, gas fluid exchange, silicone oil, repair of complex traction retinal detachment  . Pars plana vitrectomy  09/30/2011    Procedure: PARS PLANA VITRECTOMY WITH 25 GAUGE;  Surgeon: [name], MD;  Location: [place];  Service: Ophthalmology;  Laterality: Right;  Endolaser; Repair of Complex Traction Retinal Detachment  . Gas/fluid exchange  09/30/2011    Procedure: GAS/FLUID EXCHANGE;  Surgeon: [name], MD;  Location: [place];  Service: Ophthalmology;  Laterality: Right;  . Gas insertion  09/30/2011    Procedure: INSERTION OF GAS;  Surgeon: [name], MD;  Location: [place];  Service: Ophthalmology;   Laterality: Right;  [id number]  . Eye surgery      right  . Pars plana vitrectomy  02/24/2012    Procedure: PARS PLANA VITRECTOMY WITH 25 GAUGE;  Surgeon: [name], MD;  Location: [facility];  Service: Ophthalmology;  Laterality: Left;  . Silicon oil removal  7/61/9509    Procedure: SILICON OIL REMOVAL;  Surgeon: [name], MD;  Location: [facility];  Service: Ophthalmology;  Laterality: Left;  . Ptca    . Thrombectomy / arteriovenous graft revision    . Cardiac catheterization    . Coronary angioplasty  05/28/2014    stent placement to the mid RCA  . Left heart catheterization with coronary angiogram N/A 05/28/2013    Procedure: LEFT HEART CATHETERIZATION WITH CORONARY ANGIOGRAM;  Surgeon: [name], MD;  Location: [facility] CATH LAB;  Service: Cardiovascular;  Laterality: N/A;    SOCIAL HISTORY:  Social History  Substance Use Topics  . Smoking status: Former Smoker -- 1.00 packs/day for 25 years    Quit date: 06/09/2010  . Smokeless tobacco: Never Used  . Alcohol Use: No    FAMILY HISTORY:  Family History  Problem Relation Age of Onset  . Anesthesia problems Neg Hx   . Hypotension Neg Hx   . Malignant hyperthermia Neg Hx   . Pseudochol deficiency Neg Hx   . Stroke Mother   . Heart attack Mother   . Heart disease Mother   . Colon cancer Father   . Colon cancer Sister   . Heart attack Brother   . Stroke Brother   . Diabetes Brother   . Breast cancer Sister   . Diabetes Brother   . Diabetes Daughter     DRUG ALLERGIES:  Allergies  Allergen Reactions  . Metformin Diarrhea  . Hydrocodone Hives    REVIEW OF SYSTEMS:   CONSTITUTIONAL: No fever, fatigue or weakness.  EYES: No blurred or double vision.  EARS, NOSE, AND THROAT: No tinnitus or ear pain.  RESPIRATORY: No cough, positive for shortness of breath, no wheezing or hemoptysis.  CARDIOVASCULAR: Positive for chest pain, no orthopnea, edema.  GASTROINTESTINAL: No nausea, vomiting, diarrhea or abdominal pain.   GENITOURINARY: No dysuria, hematuria.  ENDOCRINE: No polyuria, nocturia,  HEMATOLOGY: No anemia, easy bruising or bleeding SKIN: No rash or lesion. MUSCULOSKELETAL: No joint pain or arthritis.   NEUROLOGIC: No tingling, numbness, weakness.  PSYCHIATRY: No anxiety or depression.   MEDICATIONS AT HOME:  Prior to Admission medications   Medication Sig Start Date End Date Taking? Authorizing Provider  albuterol (PROVENTIL HFA;VENTOLIN HFA) 108 (90 BASE) MCG/ACT inhaler Inhale 2 puffs into the lungs every 6 (six) hours as  needed for wheezing or shortness of breath.    Historical Provider, MD  aspirin EC 81 MG tablet Take 81 mg by mouth daily.    Historical Provider, MD  BAYER CONTOUR TEST test strip TEST BLOOD SUGARS 2 TIMES DAILY 09/26/13   Jackolyn Confer, MD  Blood Glucose Monitoring Suppl (ONE TOUCH ULTRA SYSTEM KIT) W/DEVICE KIT Please dispense for patient to use at home to check blood sugars. Patient taking differently: 1 kit by Other route See admin instructions. Pt uses to test blood sugars. 07/27/13   Jackolyn Confer, MD  calcium acetate (PHOSLO) 667 MG capsule Take [phone] mg by mouth 5 (five) times daily. Pt takes four capsules with meals and two capsules with snacks.    Historical Provider, MD  carvedilol (COREG) 12.5 MG tablet Take 1 tablet (12.5 mg total) by mouth 2 (two) times daily. 06/03/13   Rhonda G Barrett, PA-C  cloNIDine (CATAPRES) 0.1 MG tablet Take 1 tablet (0.1 mg total) by mouth 2 (two) times daily. 01/30/15   Minna Merritts, MD  Insulin Detemir (LEVEMIR) 100 UNIT/ML Pen Inject 5 Units into the skin daily. 03/28/14   Jackolyn Confer, MD  Lancets Thin MISC Using at home to test blood sugars twice daily. Patient taking differently: 1 each by Other route 2 (two) times daily. Pt uses to test blood sugars. 07/25/13   Jackolyn Confer, MD  mometasone (NASONEX) 50 MCG/ACT nasal spray Place 2 sprays into the nose 2 (two) times daily as needed (for allergies).    Historical  Provider, MD  nitroGLYCERIN (NITROSTAT) 0.4 MG SL tablet Place 1 tablet (0.4 mg total) under the tongue every 5 (five) minutes as needed for chest pain. 06/03/13   Evelene Croon Barrett, PA-C  NOVOFINE 32G X 6 MM MISC USE AS DIRECTED 01/08/15   Jackolyn Confer, MD  pregabalin (LYRICA) 75 MG capsule Take 1 capsule (75 mg total) by mouth daily. 03/28/14   Jackolyn Confer, MD  rosuvastatin (CRESTOR) 40 MG tablet Take 1 tablet (40 mg total) by mouth daily. 02/02/14   Minna Merritts, MD      PHYSICAL EXAMINATION:   VITAL SIGNS: Blood pressure 193/70, pulse 74, temperature 98.4 F (36.9 C), temperature source Oral, resp. rate 12, height '5\' 7"'  (1.702 m), weight 76.204 kg (168 lb), SpO2 100 %.  GENERAL:  59 y.o.-year-old patient lying in the bed with no acute distress.  EYES: Pupils equal, round, reactive to light and accommodation. No scleral icterus. Extraocular muscles intact.  HEENT: Head atraumatic, normocephalic. Oropharynx and nasopharynx clear.  NECK:  Supple, no jugular venous distention. No thyroid enlargement, no tenderness.  LUNGS: Normal breath sounds bilaterally, no wheezing, rales,rhonchi or crepitation. No use of accessory muscles of respiration.  CARDIOVASCULAR: S1, S2 normal. No murmurs, rubs, or gallops.  ABDOMEN: Soft, nontender, nondistended. Bowel sounds present. No organomegaly or mass.  EXTREMITIES: No pedal edema, cyanosis, or clubbing. Left arm AV fistula present NEUROLOGIC: Cranial nerves II through XII are intact. Muscle strength 5/5 in all extremities. Sensation intact. Gait not checked.  PSYCHIATRIC: The patient is alert and oriented x 3.  SKIN: No obvious rash, lesion, or ulcer.   LABORATORY PANEL:   CBC  Recent Labs Lab 05/02/15 1153  WBC 9.1  HGB 9.4*  HCT 28.1*  PLT 153  MCV 104.6*  MCH 35.2*  MCHC 33.6  RDW 17.1*   ------------------------------------------------------------------------------------------------------------------  Chemistries    Recent Labs Lab 05/02/15 1153  NA 141  K 4.4  CL 100*  CO2 32  GLUCOSE 116*  BUN 35*  CREATININE 6.80*  CALCIUM 8.4*   ------------------------------------------------------------------------------------------------------------------ estimated creatinine clearance is 9.7 mL/min (by C-G formula based on Cr of 6.8). ------------------------------------------------------------------------------------------------------------------ No results for input(s): TSH, T4TOTAL, T3FREE, THYROIDAB in the last 72 hours.  Invalid input(s): FREET3   Coagulation profile No results for input(s): INR, PROTIME in the last 168 hours. ------------------------------------------------------------------------------------------------------------------- No results for input(s): DDIMER in the last 72 hours. -------------------------------------------------------------------------------------------------------------------  Cardiac Enzymes  Recent Labs Lab 05/02/15 1153  TROPONINI 0.05*   ------------------------------------------------------------------------------------------------------------------ Invalid input(s): POCBNP  ---------------------------------------------------------------------------------------------------------------  Urinalysis    Component Value Date/Time   COLORURINE Yellow 06/26/2012 2030   APPEARANCEUR Hazy 06/26/2012 2030   LABSPEC 1.010 06/26/2012 2030   PHURINE 8.0 06/26/2012 2030   GLUCOSEU 50 mg/dL 06/26/2012 2030   HGBUR Negative 06/26/2012 2030   BILIRUBINUR Negative 06/26/2012 2030   KETONESUR Negative 06/26/2012 2030   PROTEINUR >=500 06/26/2012 2030   NITRITE Negative 06/26/2012 2030   LEUKOCYTESUR Negative 06/26/2012 2030     RADIOLOGY: Dg Chest 2 View  05/02/2015  CLINICAL DATA:  Shortness of breath and chest pain. Pre catheterization. EXAM: CHEST  2 VIEW COMPARISON:  11/23/2014. FINDINGS: Trachea is midline. Heart is at the upper limits normal in  size. There may be mild diffuse interstitial prominence and indistinctness. No pleural fluid. IMPRESSION: Question mild edema. Electronically Signed   By: Lorin Picket M.D.   On: 05/02/2015 12:53    IMPRESSION AND PLAN:  * Non-ST elevation MI  Positive stress test 2 weeks ago, elevated troponin, chest pain and shortness of breath.  She is going for cardiac catheterization by Dr. Araceli Bouche today  Continue her home medications for coronary artery disease and further management depending on cardiology advised after catheterization.  * Diabetes  Continue Lantus and monitor on sliding scale coverage.  * Hypertension  In the home meds.  * History of renal disease on hemodialysis  Called nephrology consult for dialysis.  * Hyperlipidemia  Continue rosuvastatin.   All the records are reviewed and case discussed with ED provider. Management plans discussed with the patient, family and they are in agreement.  CODE STATUS: Full code   TOTAL TIME TAKING CARE OF THIS PATIENT: 50 minutes.    Vaughan Basta M.D on 05/02/2015   Between 7am to 6pm - Pager - [phone]  After 6pm go to [email or web address] - password [email or web address] [facility]  Office  [phone]  CC: Primary care physician; Rica Mast, MD   Note: This dictation was prepared with Dragon dictation along with smaller phrase technology. Any transcriptional errors that result from this process are unintentional.

## 2015-05-02 NOTE — Telephone Encounter (Signed)
S/w pt who reports beginning at 3am, she has had difficulty breathing, coughing, and experiencing diarrhea.  She states these are exactly the same symptoms as when she had the heart attack Dec 2014. Denies chest pain today and states she never had chest pain last MI. She is home alone. I advised pt to call 911 now. She verbalized understanding and states she will call them immediately.  Pt had no further questions.

## 2015-05-02 NOTE — Telephone Encounter (Signed)
Pt c/o of Chest Pain: STAT if CP now or developed within 24 hours   1. Are you having CP right now? yes  2. Are you experiencing any other symptoms (ex. SOB, nausea, vomiting, sweating)? SOB, diarrhea  3. How long have you been experiencing CP? About 3 am this morning  4. Is your CP continuous or coming and going? Feels like I'm gasping for breath, and coughing  5. Have you taken Nitroglycerin? no ?

## 2015-05-02 NOTE — Progress Notes (Signed)
HD tx completed.

## 2015-05-02 NOTE — ED Notes (Signed)
Pt arrived Via EMS, C/O SOB and chest pain. Expected cath lab today per DR.gullom

## 2015-05-02 NOTE — H&P (View-Only) (Signed)
Patient ID: Theresa [name], female    DOB: 05-Mar-1956, 59 y.o.   MRN: [id number]  HPI Comments: Theresa [name] is a pleasant 18 -year old woman with 25 years of smoking, diabetes, chronic renal insufficiency,  previously noted to have moderate to severe mitral valve regurgitation on prior echocardiogram (improved to moderate MR on recent echo), pneumonia in June of 2012 with admission overnight to [facility] who initially presented by referral from Dr. Candiss [name] (renal) for evaluation of her ejection fraction which is 45% and mitral valve disease.  on dialysis since February 2014, 3 days per week. She has 40-59% bilateral carotid arterial disease as of September 2014  Drug-eluting stent placed December 2014 to the mid RCA She presents today for follow-up of her coronary artery disease and recent positive stress test  She reports having routine stress testing through [facility] on 04/17/2015 as part of her preop evaluation for renal transplant. Stress test shows moderate in size, moderate severity reversible defect of the inferior wall consistent with ischemia also defect noted in the apical septal, anteroseptal region, ejection fraction 60% He was recommended that she follow-up with [place] today. She has been taken off the renal transplant list per the patient  Echocardiogram shows normal LV function, right-sided pressures 45 mmHg moderate MR  Other past medical history evaluation in the hospital  05/12/2014 for fluid overload, hyperkalemia, shortness of breath, saturations 89% on room air, systolic blood pressure 010. She had dialysis with improvement of her symptoms. She does hemodialysis on Monday, Wednesday, Friday She was told that her potassium intake was too high.   Previously he would wake up with tachycardia.  sits up and symptoms typically resolve.   hospital admission for pneumonia 05/28/2013, elevated cardiac enzymes, transferred to [place] for cardiac catheterization. She had stent placed for 95% mid  RCA lesion. She had 2.5 x 16 mm Promus stent placed, post dilated up to 2.78 mm . Since then she reports she is doing well with no further chest pain or shortness of breath . She is on the renal transplant list but reports this will require her to be on the list for 5 years before transplant eligibility   Readmission to the hospital 06/14/2013 for C. difficile colitis. Discharge on 06/16/2013 C. difficile was indigent positive, toxin negative. She was treated with Flagyl for 10 days  Interestingly Stress test at [facility] August 2014 was essentially normal with normal ejection fraction (there was mention of very small mild region of artifact) Echocardiogram at [facility] July 2014 showed ejection fraction greater than 55%, mild MR  She sees Dr. Candiss [name]  of [place] [facility] .  previous  AV graft on 02/23/2013. Graft was thrombosed. Status post revision   Echocardiogram on November 10, 2010 shows mildly dilated left ventricle, ejection fraction 45-50%, mild MS, moderate to severe mitral regurg, mild to moderate tricuspid regurg.     Allergies  Allergen Reactions  . Metformin Diarrhea  . Hydrocodone Hives    Outpatient Encounter Prescriptions as of 05/01/2015  Medication Sig  . albuterol (PROVENTIL HFA;VENTOLIN HFA) 108 (90 BASE) MCG/ACT inhaler Inhale 2 puffs into the lungs every 6 (six) hours as needed for wheezing or shortness of breath.  Marland Kitchen aspirin EC 81 MG tablet Take 81 mg by mouth daily.  Marland Kitchen BAYER CONTOUR TEST test strip TEST BLOOD SUGARS 2 TIMES DAILY  . Blood Glucose Monitoring Suppl (ONE TOUCH ULTRA SYSTEM KIT) W/DEVICE KIT Please dispense for patient to use at home to check blood sugars. (Patient taking  differently: 1 kit by Other route See admin instructions. Pt uses to test blood sugars.)  . calcium acetate (PHOSLO) 667 MG capsule Take 667-2,668 mg by mouth 5 (five) times daily. Pt takes four capsules with meals and two capsules with snacks.  . carvedilol (COREG) 12.5 MG tablet Take 1 tablet (12.5  mg total) by mouth 2 (two) times daily.  . cloNIDine (CATAPRES) 0.1 MG tablet Take 1 tablet (0.1 mg total) by mouth 2 (two) times daily.  . Insulin Detemir (LEVEMIR) 100 UNIT/ML Pen Inject 5 Units into the skin daily.  . Lancets Thin MISC Using at home to test blood sugars twice daily. (Patient taking differently: 1 each by Other route 2 (two) times daily. Pt uses to test blood sugars.)  . mometasone (NASONEX) 50 MCG/ACT nasal spray Place 2 sprays into the nose 2 (two) times daily as needed (for allergies).  . nitroGLYCERIN (NITROSTAT) 0.4 MG SL tablet Place 1 tablet (0.4 mg total) under the tongue every 5 (five) minutes as needed for chest pain.  Marland Kitchen NOVOFINE 32G X 6 MM MISC USE AS DIRECTED  . pregabalin (LYRICA) 75 MG capsule Take 1 capsule (75 mg total) by mouth daily.  . rosuvastatin (CRESTOR) 40 MG tablet Take 1 tablet (40 mg total) by mouth daily.  . [DISCONTINUED] oxyCODONE-acetaminophen (ROXICET) 5-325 MG per tablet Take 1 tablet by mouth every 8 (eight) hours as needed for severe pain. (Patient not taking: Reported on 05/01/2015)   No facility-administered encounter medications on file as of 05/01/2015.    Past Medical History  Diagnosis Date  . Hypertension   . ESRD on hemodialysis ([place])     a. [facility] in [place], [place], on a TTS schedule.  She started dialysis in Feb 2014.  Etiology of renal failure not known, likely diabetes.  Has a left upper arm AV graft.  Marland Kitchen COPD (chronic obstructive pulmonary disease) ([place])   . Emphysema   . History of pneumonia     June 2012  . History of bronchitis     Mar 2012  . Peripheral vascular disease ([place])   . Chronic constipation   . Colon polyps   . Sickle cell trait ([place])   . Anemia of chronic disease   . Diabetes mellitus   . Moderate mitral insufficiency     a. 10/2013 Echo: EF 55-60%, mod MR.  . Diabetic neuropathy ([place])   . Diabetic retinopathy ([place]) 05/28/2013    Hx bilat retinal detachment, proliferative diab retinopathy and bilat  vitreous hemorrhage   . Hyperlipidemia   . Coronary artery disease     a. 05/2013 NSTEMI/PCI: LM 20d, LAD min irregs, LCX small, nl, OM1 nl, RCA dom 29m(2.5x16 Promus DES), PDA1 80p.  . Chronic diastolic CHF (congestive heart failure) ([place]     a. 10/2013 Echo ([facility]: EF 55-60%, mod conc LVH, mod MR, mildly dil LA, mild Ao sclerosis w/o stenosis.  . Carotid arterial disease ([place]     a. 02/2013 U/S: 40-59% bilat ICA stenosis - *f/u 02/2014*  . History of tobacco abuse     a. Quit 2012.    Past Surgical History  Procedure Laterality Date  . Abdominal hysterectomy      2000  . Dilation and curettage of uterus      several in the early 80's  . Eye surgery      bilateral laser 2012  . Esophagogastroduodenoscopy      2012  . Colonscopy    . Tubal ligation  1979  . Pars plana vitrectomy  04/22/2011    Procedure: PARS PLANA VITRECTOMY WITH 25 GAUGE;  Surgeon: [name], MD;  Location: [place];  Service: Ophthalmology;  Laterality: Left;  membrane peel, endolaser, gas fluid exchange, silicone oil, repair of complex traction retinal detachment  . Pars plana vitrectomy  09/30/2011    Procedure: PARS PLANA VITRECTOMY WITH 25 GAUGE;  Surgeon: [name], MD;  Location: [place];  Service: Ophthalmology;  Laterality: Right;  Endolaser; Repair of Complex Traction Retinal Detachment  . Gas/fluid exchange  09/30/2011    Procedure: GAS/FLUID EXCHANGE;  Surgeon: [name], MD;  Location: [place];  Service: Ophthalmology;  Laterality: Right;  . Gas insertion  09/30/2011    Procedure: INSERTION OF GAS;  Surgeon: [name], MD;  Location: [place];  Service: Ophthalmology;  Laterality: Right;  [id number]  . Eye surgery      right  . Pars plana vitrectomy  02/24/2012    Procedure: PARS PLANA VITRECTOMY WITH 25 GAUGE;  Surgeon: [name], MD;  Location: [place];  Service: Ophthalmology;  Laterality: Left;  . Silicon oil removal  3/00/9233    Procedure: SILICON OIL REMOVAL;  Surgeon: [name], MD;  Location: [place];  Service: Ophthalmology;  Laterality: Left;  . Ptca    . Thrombectomy / arteriovenous graft revision    . Cardiac catheterization    . Coronary angioplasty  05/28/2014    stent placement to the mid RCA  . Left heart catheterization with coronary angiogram N/A 05/28/2013    Procedure: LEFT HEART CATHETERIZATION WITH CORONARY ANGIOGRAM;  Surgeon: [name], MD;  Location: [facility] CATH LAB;  Service: Cardiovascular;  Laterality: N/A;    Social History  reports that she quit smoking about 4 years ago. She has never used smokeless tobacco. She reports that she does not drink alcohol or use illicit drugs.  Family History family history includes Breast cancer in her sister; Colon cancer in her father and sister; Diabetes in her brother, brother, and daughter; Heart attack in her brother and mother; Heart disease in her mother; Stroke in her brother and mother. There is no history of Anesthesia problems, Hypotension, Malignant hyperthermia, or Pseudochol deficiency.   Review of Systems  Constitutional: Negative.   Respiratory: Negative.   Cardiovascular: Negative.   Gastrointestinal: Negative.   Endocrine: Negative.   Musculoskeletal: Negative.   Skin: Negative.   Allergic/Immunologic: Negative.   Neurological: Negative.   Hematological: Negative.   Psychiatric/Behavioral: Negative.   All other systems reviewed and are negative.  BP 140/70 mmHg  Pulse 80  Ht 5' 7.5" (1.715 m)  Wt 169 lb 4 oz (76.771 kg)  BMI 26.10 kg/m2  Physical Exam  Constitutional: She is oriented to person, place, and time. She appears well-developed and well-nourished.  AV graft on her left arm  HENT:  Head: Normocephalic.  Nose: Nose normal.  Mouth/Throat: Oropharynx is clear and moist.  Eyes: Conjunctivae are normal. Pupils are equal, round, and reactive to light.  Neck: Normal range of motion. Neck supple. No JVD present. Carotid bruit is present.  Cardiovascular:  Normal rate, regular rhythm, S1 normal, S2 normal and intact distal pulses.  Exam reveals no gallop and no friction rub.   Murmur heard.  Crescendo systolic murmur is present with a grade of 2/6  Pulmonary/Chest: Effort normal and breath sounds normal. No respiratory distress. She has no wheezes. She has no rales. She exhibits no tenderness.  Abdominal: Soft.  Bowel sounds are normal. She exhibits no distension. There is no tenderness.  Musculoskeletal: Normal range of motion. She exhibits no edema or tenderness.  Lymphadenopathy:    She has no cervical adenopathy.  Neurological: She is alert and oriented to person, place, and time. Coordination normal.  Skin: Skin is warm and dry. No rash noted. No erythema.  Psychiatric: She has a normal mood and affect. Her behavior is normal. Judgment and thought content normal.    Assessment and Plan  Nursing note and vitals reviewed.

## 2015-05-02 NOTE — Telephone Encounter (Signed)
Per Dr. Rockey Situ, he does not want pt to go to ED, wants her to go to cath lab for cath @ 12:00. Spoke w/ pt.  She reports that EMS is at her house now and she is being loaded into that ambulance. Spoke w/ Colletta Maryland @ triage in [facility] ED. Advised her that when pt arrives, to route her to the cath lab. Spoke w/ Colletta Maryland in cath lab and made them aware. Spoke w/ Pamala Hurry in scheduling, pt is on for 12:00 w/ Dr. Rockey Situ. Spoke w/ Caryl Pina, no precert necessary.

## 2015-05-02 NOTE — Progress Notes (Signed)
Pt. admitted to unit, [id number]. Oriented to room, call bell, Ascom phones and staff. Bed in low position. Fall safety plan reviewed, YELLOW non-skid socks in place, bed alarm on. Full assessment to Epic: R femoral cath site WNL, no hematoma or bleeding; pedal pulses palpable +1. Will continue to monitor. Pt taken to dialysis, appropriate consents signed and placed in chart and pt is off to dialysis.

## 2015-05-02 NOTE — Progress Notes (Signed)
HD tx start 

## 2015-05-03 DIAGNOSIS — N186 End stage renal disease: Secondary | ICD-10-CM | POA: Diagnosis not present

## 2015-05-03 DIAGNOSIS — I200 Unstable angina: Secondary | ICD-10-CM | POA: Diagnosis not present

## 2015-05-03 DIAGNOSIS — D631 Anemia in chronic kidney disease: Secondary | ICD-10-CM | POA: Diagnosis not present

## 2015-05-03 DIAGNOSIS — E119 Type 2 diabetes mellitus without complications: Secondary | ICD-10-CM | POA: Diagnosis not present

## 2015-05-03 DIAGNOSIS — R0989 Other specified symptoms and signs involving the circulatory and respiratory systems: Secondary | ICD-10-CM | POA: Diagnosis not present

## 2015-05-03 DIAGNOSIS — I151 Hypertension secondary to other renal disorders: Secondary | ICD-10-CM

## 2015-05-03 DIAGNOSIS — I25111 Atherosclerotic heart disease of native coronary artery with angina pectoris with documented spasm: Secondary | ICD-10-CM | POA: Insufficient documentation

## 2015-05-03 DIAGNOSIS — E785 Hyperlipidemia, unspecified: Secondary | ICD-10-CM

## 2015-05-03 DIAGNOSIS — R51 Headache: Secondary | ICD-10-CM

## 2015-05-03 DIAGNOSIS — Z955 Presence of coronary angioplasty implant and graft: Secondary | ICD-10-CM | POA: Insufficient documentation

## 2015-05-03 DIAGNOSIS — Z992 Dependence on renal dialysis: Secondary | ICD-10-CM | POA: Diagnosis not present

## 2015-05-03 DIAGNOSIS — I10 Essential (primary) hypertension: Secondary | ICD-10-CM | POA: Diagnosis not present

## 2015-05-03 DIAGNOSIS — R0609 Other forms of dyspnea: Secondary | ICD-10-CM | POA: Diagnosis not present

## 2015-05-03 DIAGNOSIS — R519 Headache, unspecified: Secondary | ICD-10-CM | POA: Insufficient documentation

## 2015-05-03 DIAGNOSIS — N2889 Other specified disorders of kidney and ureter: Secondary | ICD-10-CM

## 2015-05-03 DIAGNOSIS — N2581 Secondary hyperparathyroidism of renal origin: Secondary | ICD-10-CM | POA: Diagnosis not present

## 2015-05-03 DIAGNOSIS — R0602 Shortness of breath: Secondary | ICD-10-CM | POA: Diagnosis not present

## 2015-05-03 LAB — HEPATITIS B SURFACE ANTIBODY, QUANTITATIVE: Hep B S AB Quant (Post): 340.1 m[IU]/mL (ref >9.9)

## 2015-05-03 LAB — CBC
HEMATOCRIT: 27.2 % — AB (ref 35.0–47.0)
Hemoglobin: 8.9 g/dL — ABNORMAL LOW (ref 12.0–16.0)
MCH: 34.5 pg — ABNORMAL HIGH (ref 26.0–34.0)
MCHC: 32.6 g/dL (ref 32.0–36.0)
MCV: 105.8 fL — ABNORMAL HIGH (ref 80.0–100.0)
Platelets: 137 10*3/uL — ABNORMAL LOW (ref 150–440)
RBC: 2.57 MIL/uL — ABNORMAL LOW (ref 3.80–5.20)
RDW: 16.9 % — ABNORMAL HIGH (ref 11.5–14.5)
WBC: 7.6 10*3/uL (ref 3.6–11.0)

## 2015-05-03 LAB — BASIC METABOLIC PANEL
Anion gap: 10 (ref 5–15)
BUN: 34 mg/dL — AB (ref 6–20)
CO2: 32 mmol/L (ref 22–32)
CREATININE: 5.8 mg/dL — AB (ref 0.44–1.00)
Calcium: 8.3 mg/dL — ABNORMAL LOW (ref 8.9–10.3)
Chloride: 98 mmol/L — ABNORMAL LOW (ref 101–111)
GFR calc Af Amer: 8 mL/min — ABNORMAL LOW (ref >60)
GFR, EST NON AFRICAN AMERICAN: 7 mL/min — AB (ref >60)
GLUCOSE: 103 mg/dL — AB (ref 65–99)
POTASSIUM: 4.7 mmol/L (ref 3.5–5.1)
Sodium: 140 mmol/L (ref 135–145)

## 2015-05-03 LAB — GLUCOSE, CAPILLARY
GLUCOSE-CAPILLARY: 102 mg/dL — AB (ref 65–99)
GLUCOSE-CAPILLARY: 210 mg/dL — AB (ref 65–99)
GLUCOSE-CAPILLARY: 166 mg/dL — AB (ref 65–99)
Glucose-Capillary: 106 mg/dL — ABNORMAL HIGH (ref 65–99)

## 2015-05-03 LAB — HEPATITIS B SURFACE ANTIGEN: Hepatitis B Surface Ag: NEGATIVE

## 2015-05-03 MED ORDER — OXYCODONE HCL 5 MG PO TABS
5.0000 mg | ORAL_TABLET | Freq: Once | ORAL | Status: AC
  Administered 2015-05-03: 5 mg via ORAL
  Filled 2015-05-03: qty 1

## 2015-05-03 MED ORDER — LEVOFLOXACIN 250 MG PO TABS: 250.0000 mg | ORAL_TABLET | ORAL | Status: DC

## 2015-05-03 MED ORDER — CALCIUM ACETATE (PHOS BINDER) 667 MG PO CAPS: 1334.0000 mg | ORAL_CAPSULE | Freq: Two times a day (BID) | ORAL | Status: DC

## 2015-05-03 MED ORDER — AMLODIPINE BESYLATE 10 MG PO TABS: 10.0000 mg | ORAL_TABLET | Freq: Every day | ORAL | Status: DC

## 2015-05-03 MED ORDER — AMLODIPINE BESYLATE 10 MG PO TABS
10.0000 mg | ORAL_TABLET | Freq: Every day | ORAL | Status: DC
  Administered 2015-05-04: 10 mg via ORAL
  Filled 2015-05-03: qty 1

## 2015-05-03 MED ORDER — LEVOFLOXACIN 500 MG PO TABS
500.0000 mg | ORAL_TABLET | ORAL | Status: AC
  Administered 2015-05-03: 500 mg via ORAL
  Filled 2015-05-03: qty 1

## 2015-05-03 MED ORDER — BENZONATATE 100 MG PO CAPS
200.0000 mg | ORAL_CAPSULE | Freq: Three times a day (TID) | ORAL | Status: DC | PRN
  Administered 2015-05-03 – 2015-05-04 (×3): 200 mg via ORAL
  Filled 2015-05-03 (×3): qty 2

## 2015-05-03 NOTE — Progress Notes (Signed)
Pt complaining of a headache 6 out of 10. Pt has received 500mg  of tylenol twice & it has not relieved it. Spoke with MD, Dr. Jannifer Franklin to put in a one time dose of oxycodone. No other concerns. Will continue to monitor. Conley Simmonds, RN

## 2015-05-03 NOTE — Progress Notes (Signed)
[place] [facility] [place] NAME: Theresa [name]    MR#:  [id number]  DATE OF BIRTH:  18-Jun-1955  SUBJECTIVE:   Patient continues to have shortness of breath and states she has no improvement since admission. She has a planing of a headache.  REVIEW OF SYSTEMS:    Review of Systems  Constitutional: Positive for malaise/fatigue. Negative for fever and chills.  HENT: Negative for sore throat.   Eyes: Negative for blurred vision.  Respiratory: Positive for cough and shortness of breath. Negative for hemoptysis and wheezing.   Cardiovascular: Negative for chest pain, palpitations and leg swelling.  Gastrointestinal: Negative for nausea, vomiting, abdominal pain, diarrhea and blood in stool.  Genitourinary: Negative for dysuria.  Musculoskeletal: Negative for back pain.  Neurological: Positive for weakness and headaches. Negative for dizziness and tremors.  Endo/Heme/Allergies: Does not bruise/bleed easily.    Tolerating Diet:yes      DRUG ALLERGIES:   Allergies  Allergen Reactions  . Metformin Diarrhea  . Hydrocodone Hives    VITALS:  Blood pressure 158/56, pulse 76, temperature 98.5 F (36.9 C), temperature source Oral, resp. rate 18, height 5' 7.5" (1.715 m), weight 76.2 kg (167 lb 15.9 oz), SpO2 96 %.  PHYSICAL EXAMINATION:   Physical Exam  Constitutional: She is oriented to person, place, and time and well-developed, well-nourished, and in no distress. No distress.  HENT:  Head: Normocephalic.  Eyes: No scleral icterus.  Neck: Normal range of motion. Neck supple. No JVD present. No tracheal deviation present.  Cardiovascular: Normal rate and regular rhythm.  Exam reveals no gallop and no friction rub.   Murmur heard. Pulmonary/Chest: Effort normal and breath sounds normal. No respiratory distress. She has no wheezes. She has no rales. She exhibits no tenderness.  Abdominal: Soft. Bowel sounds are normal. She exhibits no  distension and no mass. There is no tenderness. There is no rebound and no guarding.  Musculoskeletal: Normal range of motion. She exhibits no edema.  Neurological: She is alert and oriented to person, place, and time.  Skin: Skin is warm. No rash noted. No erythema.  Psychiatric: Affect and judgment normal.      LABORATORY PANEL:   CBC  Recent Labs Lab 05/03/15 0816  WBC 7.6  HGB 8.9*  HCT 27.2*  PLT 137*   ------------------------------------------------------------------------------------------------------------------  Chemistries   Recent Labs Lab 05/03/15 0816  NA 140  K 4.7  CL 98*  CO2 32  GLUCOSE 103*  BUN 34*  CREATININE 5.80*  CALCIUM 8.3*   ------------------------------------------------------------------------------------------------------------------  Cardiac Enzymes  Recent Labs Lab 05/02/15 1153  TROPONINI 0.05*   ------------------------------------------------------------------------------------------------------------------  RADIOLOGY:  Dg Chest 2 View  05/02/2015  CLINICAL DATA:  Shortness of breath and chest pain. Pre catheterization. EXAM: CHEST  2 VIEW COMPARISON:  11/23/2014. FINDINGS: Trachea is midline. Heart is at the upper limits normal in size. There may be mild diffuse interstitial prominence and indistinctness. No pleural fluid. IMPRESSION: Question mild edema. Electronically Signed   By: Lorin Picket M.D.   On: 05/02/2015 12:53     ASSESSMENT AND PLAN:   59 year old female with a history of diabetes, end-stage renal disease on hemodialysis and CAD who presented with shortness of breath and underwent cardiac catheterization yesterday.  1. Shortness of breath: Patient underwent cardiac catheterization for an abnormal stress test which showed no significant stenosis. She continues to have some shortness of breath however her lung examination is unremarkable. She has already undergone dialysis today. I  will order CT chest to  rule out pulmonary emboli. I also suspect she could've an underlying pneumonia and therefore will empirically start Levaquin and the CT scan will help [place] also evaluate for pneumonia.  2. End-stage renal disease and hemodialysis: Patient will require dialysis tomorrow.  3. CAD: Patient had a prior stent to the RCA which is patent as per cardiac catheterization yesterday. Patient has mild nonobstructive disease. Continue Norvasc and Crestor.  4. Accelerated hypertension: It was noted patient had increased blood pressure while in cardiac catheterization. Continue increased dose of Norvasc, Coreg, clonidine.  5. Diabetes: Continue Levemir and send scale insulin with ADA diet.      Management plans discussed with the patient and she is in agreement. D/w [name]   CODE STATUS: FULL  TOTAL TIME TAKING CARE OF THIS PATIENT: 35 minutes.     POSSIBLE D/C 1-2 days, DEPENDING ON CLINICAL CONDITION.   Brenner Visconti M.D on 05/03/2015 at 4:09 PM  Between 7am to 6pm - Pager - [phone] After 6pm go to [email or web address] - password [email or web address] [facility]  Office  [phone]  CC: Primary care physician; Rica Mast, MD  Note: This dictation was prepared with Dragon dictation along with smaller phrase technology. Any transcriptional errors that result from this process are unintentional.

## 2015-05-03 NOTE — Progress Notes (Signed)
Patient: Theresa [name] / Admit Date: 05/02/2015 / Date of Encounter: 05/03/2015, 1:25 PM   Subjective: Cardiac catheterization yesterday with no obstructive disease, medical management recommended Dialysis last night when for 2 hours, had cramping, terminated early Continued shortness of breath, no improvement since admission, infect probably worse now on nasal cannula oxygen She has headache, cough, hoarse throat, continued shortness of breath She does not feel that she has extra fluid  Review of Systems: Review of Systems  Constitutional: Negative.        Malaise, sore throat  Respiratory: Positive for cough and shortness of breath.   Cardiovascular: Negative.   Gastrointestinal: Negative.   Musculoskeletal: Negative.   Neurological: Negative.        Headache  Psychiatric/Behavioral: Negative.   All other systems reviewed and are negative.    Objective: Telemetry: nsr Physical Exam: Blood pressure 158/56, pulse 76, temperature 98.5 F (36.9 C), temperature source Oral, resp. rate 18, height 5' 7.5" (1.715 m), weight 167 lb 15.9 oz (76.2 kg), SpO2 96 %. Body mass index is 25.91 kg/(m^2). General: Well developed, well nourished, in no acute distress. Head: Normocephalic, atraumatic, sclera non-icteric, no xanthomas, nares are without discharge. Neck: Negative for carotid bruits. JVP not elevated. Lungs: Mildly decreased breath sounds throughout, Rales at the bases right greater than left Heart: RRR S1 S2 without murmurs, rubs, or gallops.  Abdomen: Soft, non-tender, non-distended with normoactive bowel sounds. No rebound/guarding. Extremities: No clubbing or cyanosis. No edema. Distal pedal pulses are 2+ and equal bilaterally. Neuro: Alert and oriented X 3. Moves all extremities spontaneously. Psych:  Responds to questions appropriately with a normal affect.   Intake/Output Summary (Last 24 hours) at 05/03/15 1325 Last data filed at 05/03/15 0830  Gross per 24 hour    Intake    125 ml  Output   1050 ml  Net   -925 ml    Inpatient Medications:  . [START ON 05/04/2015] amLODipine  10 mg Oral Daily  . aspirin EC  81 mg Oral Daily  . calcium acetate  1,334 mg Oral BID BM  . calcium acetate  2,668 mg Oral TID WC  . carvedilol  25 mg Oral BID  . cloNIDine  0.1 mg Oral BID  . fluticasone  1 spray Each Nare Daily  . insulin aspart  0-9 Units Subcutaneous TID WC  . insulin detemir  5 Units Subcutaneous Daily  . pregabalin  75 mg Oral Daily  . rosuvastatin  40 mg Oral Daily  . sodium chloride  3 mL Intravenous Q12H   Infusions:    Labs:  Recent Labs  05/02/15 1153 05/03/15 0816  NA 141 140  K 4.4 4.7  CL 100* 98*  CO2 32 32  GLUCOSE 116* 103*  BUN 35* 34*  CREATININE 6.80* 5.80*  CALCIUM 8.4* 8.3*   No results for input(s): AST, ALT, ALKPHOS, BILITOT, PROT, ALBUMIN in the last 72 hours.  Recent Labs  05/02/15 1153 05/03/15 0816  WBC 9.1 7.6  HGB 9.4* 8.9*  HCT 28.1* 27.2*  MCV 104.6* 105.8*  PLT 153 137*    Recent Labs  05/02/15 1153  TROPONINI 0.05*   Invalid input(s): POCBNP No results for input(s): HGBA1C in the last 72 hours.   Weights: Filed Weights   05/02/15 1812 05/02/15 1815 05/02/15 2049  Weight: 169 lb 14.4 oz (77.066 kg) 169 lb 14.4 oz (77.066 kg) 167 lb 15.9 oz (76.2 kg)     Radiology/Studies:  Dg Chest 2 View  05/02/2015  CLINICAL DATA:  Shortness of breath and chest pain. Pre catheterization. EXAM: CHEST  2 VIEW COMPARISON:  11/23/2014. FINDINGS: Trachea is midline. Heart is at the upper limits normal in size. There may be mild diffuse interstitial prominence and indistinctness. No pleural fluid. IMPRESSION: Question mild edema. Electronically Signed   By: Lorin Picket M.D.   On: 05/02/2015 12:53     Assessment and Plan  59 y.o. female  is a pleasant 59 -year old woman with 25 years of smoking, diabetes, chronic renal insufficiency, previously noted to have moderate to severe mitral valve  regurgitation on prior echocardiogram (improved to moderate MR on recent echo), pneumonia in June of 2012 with admission overnight to [facility] who initially presented by referral from Dr. Candiss Norse (renal) for evaluation of her ejection fraction which is 45% and mitral valve disease.  on dialysis since February 2014, 3 days per week. She has 40-59% bilateral carotid arterial disease as of September 2014  Drug-eluting stent placed December 2014 to the mid RCA Presenting with SOB, cardiac cath yesterday for ABN stress test showing no significant stenosis, medical management recommended  1) shortness of breath Rales at the bases, right greater than left Chest x-ray yesterday with possible edema but she has had hemodialysis 3 times in the past several days. She does not feel this is fluid overload. --She does have headache, hoarseness, runny nose now shortness of breath concerning for viral infection, unable to exclude bacterial pneumonia. No significant sputum production so far. Relatively acute onset of her shortness of breath as she was seen by myself in clinic 2 days ago, symptoms presented at night relatively acutely. Low likelihood for DVT and PE but she does sit for long periods of time at hemodialysis --No improvement in her symptoms compared to yesterday, had dialysis yesterday. Again less likely fluid ----May benefit from CT chest to rule out PE, evaluate Rales at the bases, possible pneumonia. -Could treat  with antibiotics given suspicion of infection and continued symptoms. --- Case discussed in detail with Dr. Benjie Karvonen  2 end-stage renal disease Followed by nephrology, hemodialysis Monday, Wednesday, Friday This week she reports having dialysis 3 times a far Moderate pulmonary hypertension on echocardiogram 04/17/2015 at [facility]  3) cad: Prior stent to the RCA is patent on catheterization yesterday Mild nonobstructive disease False-positive stress test done at [facility]  4) mitral valve  regurgitation  Moderate MR on echo at [facility]  5)HTN: We'll increase Coreg up to 25 mg twice a day, increase amlodipine to 10 mg daily, continue clonidine  6) diabetes type II Hemoglobin A1c well controlled  7) hyperlipidemia Continue Crestor, at goal    Signed, Esmond Plants, MD [facility] 05/03/2015, 1:25 PM

## 2015-05-03 NOTE — Consult Note (Signed)
ANTIBIOTIC CONSULT NOTE - INITIAL  Pharmacy Consult for Levofloxacin  Indication: pneumonia  Allergies  Allergen Reactions  . Metformin Diarrhea  . Hydrocodone Hives    Patient Measurements: Height: 5' 7.5" (171.5 cm) Weight: 167 lb 15.9 oz (76.2 kg) IBW/kg (Calculated) : 62.75  Vital Signs: Temp: 98.5 F (36.9 C) (11/24 0454) Temp Source: Oral (11/24 0454) BP: 158/56 mmHg (11/24 1116) Pulse Rate: 76 (11/24 1116) Intake/Output from previous day: 11/23 0701 - 11/24 0700 In: 5 [I.V.:5] Out: 1000  Intake/Output from this shift: Total I/O In: 120 [P.O.:120] Out: 50 [Urine:50]  Labs:  Recent Labs  05/02/15 1153 05/03/15 0816  WBC 9.1 7.6  HGB 9.4* 8.9*  PLT 153 137*  CREATININE 6.80* 5.80*   Estimated Creatinine Clearance: 11.5 mL/min (by C-G formula based on Cr of 5.8). No results for input(s): VANCOTROUGH, VANCOPEAK, VANCORANDOM, GENTTROUGH, GENTPEAK, GENTRANDOM, TOBRATROUGH, TOBRAPEAK, TOBRARND, AMIKACINPEAK, AMIKACINTROU, AMIKACIN in the last 72 hours.   Microbiology: No results found for this or any previous visit (from the past 720 hour(s)).  Medical History: Past Medical History  Diagnosis Date  . Hypertension   . ESRD on hemodialysis ([facility])     a. [facility] in [place], [place], on a TTS schedule.  She started dialysis in Feb 2014.  Etiology of renal failure not known, likely diabetes.  Has a left upper arm AV graft.  Marland Kitchen COPD (chronic obstructive pulmonary disease) ([place])   . Emphysema   . History of pneumonia     June 2012  . History of bronchitis     Mar 2012  . Peripheral vascular disease ([place])   . Chronic constipation   . Colon polyps   . Sickle cell trait ([place])   . Anemia of chronic disease   . Diabetes mellitus   . Moderate mitral insufficiency     a. 10/2013 Echo: EF 55-60%, mod MR.  . Diabetic neuropathy ([place])   . Diabetic retinopathy ([place]) 05/28/2013    Hx bilat retinal detachment, proliferative diab retinopathy and bilat vitreous hemorrhage    . Hyperlipidemia   . Coronary artery disease     a. 05/2013 NSTEMI/PCI: LM 20d, LAD min irregs, LCX small, nl, OM1 nl, RCA dom 77m (2.5x16 Promus DES), PDA1 80p.  . Chronic diastolic CHF (congestive heart failure) ([place])     a. 10/2013 Echo [facility]): EF 55-60%, mod conc LVH, mod MR, mildly dil LA, mild Ao sclerosis w/o stenosis.  . Carotid arterial disease ([place])     a. 02/2013 U/S: 40-59% bilat ICA stenosis - *f/u 02/2014*  . History of tobacco abuse     a. Quit 2012.    Medications:  Scheduled:  . [START ON 05/04/2015] amLODipine  10 mg Oral Daily  . aspirin EC  81 mg Oral Daily  . calcium acetate  1,334 mg Oral BID BM  . calcium acetate  2,668 mg Oral TID WC  . carvedilol  25 mg Oral BID  . cloNIDine  0.1 mg Oral BID  . fluticasone  1 spray Each Nare Daily  . insulin aspart  0-9 Units Subcutaneous TID WC  . insulin detemir  5 Units Subcutaneous Daily  . [START ON 05/05/2015] levofloxacin  250 mg Oral Q48H  . levofloxacin  500 mg Oral STAT  . pregabalin  75 mg Oral Daily  . rosuvastatin  40 mg Oral Daily  . sodium chloride  3 mL Intravenous Q12H   Infusions:   PRN: sodium chloride, acetaminophen, albuterol, benzonatate, nitroGLYCERIN, sodium chloride  Assessment: 59 yo  female with ESRD on HD, ordered Levofloxacin for CAP.  Goal of Therapy:  Resolution of infection.  Plan:  Ordered Levofloxacin 500mg  PO once today, followed by Levofloxacin 250mg  PO Q48H beginning 11/26 at 18:00.    Follow up culture results  [facility] Pharmacist 05/03/2015,2:14 PM

## 2015-05-03 NOTE — Progress Notes (Signed)
[facility]  ROUNDING NOTE   Subjective:  Patient well known to [place] as an outpatient as we follow her for hemodialysis. She presented with shortness of breath. She was recently seen by cardiology and had a cardiac stress test which was done 2 weeks ago. This was found to be abnormal. She was due for cardiac catheterization. She had cardiac catheterization yesterday which showed one-vessel coronary artery disease with patent stent in the mid right coronary artery. It was felt that her shortness of breath was secondary to chronic diastolic heart failure with uncontrolled hypertension.   Objective:  Vital signs in last 24 hours:  Temp:  [98 F (36.7 C)-99 F (37.2 C)] 98.5 F (36.9 C) (11/24 0454) Pulse Rate:  [74-83] 76 (11/24 0454) Resp:  [12-81] 16 (11/24 0454) BP: (155-195)/(53-71) 155/58 mmHg (11/24 0454) SpO2:  [92 %-100 %] 93 % (11/24 0454) Weight:  [76.2 kg (167 lb 15.9 oz)-77.066 kg (169 lb 14.4 oz)] 76.2 kg (167 lb 15.9 oz) (11/23 2049)  Weight change:  Filed Weights   05/02/15 1812 05/02/15 1815 05/02/15 2049  Weight: 77.066 kg (169 lb 14.4 oz) 77.066 kg (169 lb 14.4 oz) 76.2 kg (167 lb 15.9 oz)    Intake/Output: I/O last 3 completed shifts: In: 5 [I.V.:5] Out: 1000 [Other:1000]   Intake/Output this shift:  Total I/O In: 120 [P.O.:120] Out: 50 [Urine:50]  Physical Exam: General: NAD  Head: Normocephalic, atraumatic. Moist oral mucosal membranes  Eyes: Anicteric, PERRL  Neck: Supple, trachea midline  Lungs:  Mild basilar rales  Heart: Regular rate and rhythm no rubs  Abdomen:  Soft, nontender, BS present  Extremities:  trace peripheral edema.  Neurologic: Nonfocal, moving all four extremities  Skin: No lesions  Access: LUE AVF    Basic Metabolic Panel:  Recent Labs Lab 05/02/15 1153 05/03/15 0816  NA 141 140  K 4.4 4.7  CL 100* 98*  CO2 32 32  GLUCOSE 116* 103*  BUN 35* 34*  CREATININE 6.80* 5.80*  CALCIUM 8.4* 8.3*    Liver  Function Tests: No results for input(s): AST, ALT, ALKPHOS, BILITOT, PROT, ALBUMIN in the last 168 hours. No results for input(s): LIPASE, AMYLASE in the last 168 hours. No results for input(s): AMMONIA in the last 168 hours.  CBC:  Recent Labs Lab 05/02/15 1153 05/03/15 0816  WBC 9.1 7.6  HGB 9.4* 8.9*  HCT 28.1* 27.2*  MCV 104.6* 105.8*  PLT 153 137*    Cardiac Enzymes:  Recent Labs Lab 05/02/15 1153  TROPONINI 0.05*    BNP: Invalid input(s): POCBNP  CBG:  Recent Labs Lab 05/02/15 1406 05/02/15 2134 05/03/15 0731  GLUCAP 101* 152* 102*    Microbiology: Results for orders placed or performed in visit on 02/13/14  Culture, blood (single)     Status: None   Collection Time: 02/13/14  3:00 AM  Result Value Ref Range Status   Micro Text Report   Final       COMMENT                   NO GROWTH AEROBICALLY/ANAEROBICALLY IN 5 DAYS   ANTIBIOTIC                                                      Culture, blood (single)     Status: None  Collection Time: 02/13/14  3:15 AM  Result Value Ref Range Status   Micro Text Report   Final       COMMENT                   NO GROWTH AEROBICALLY/ANAEROBICALLY IN 5 DAYS   ANTIBIOTIC                                                        Coagulation Studies:  Recent Labs  05/02/15 1153  LABPROT 15.1*  INR 1.17    Urinalysis: No results for input(s): COLORURINE, LABSPEC, PHURINE, GLUCOSEU, HGBUR, BILIRUBINUR, KETONESUR, PROTEINUR, UROBILINOGEN, NITRITE, LEUKOCYTESUR in the last 72 hours.  Invalid input(s): APPERANCEUR    Imaging: Dg Chest 2 View  05/02/2015  CLINICAL DATA:  Shortness of breath and chest pain. Pre catheterization. EXAM: CHEST  2 VIEW COMPARISON:  11/23/2014. FINDINGS: Trachea is midline. Heart is at the upper limits normal in size. There may be mild diffuse interstitial prominence and indistinctness. No pleural fluid. IMPRESSION: Question mild edema. Electronically Signed   By: Lorin Picket M.D.   On: 05/02/2015 12:53     HEMODIALYSIS FLOWSHEET:  Blood Flow Rate (mL/min): 400 mL/min Arterial Pressure (mmHg): -160 mmHg Venous Pressure (mmHg): 160 mmHg Transmembrane Pressure (mmHg): 60 mmHg Ultrafiltration Rate (mL/min): 770 mL/min Dialysate Flow Rate (mL/min): 800 ml/min Conductivity: Machine : 14.1 Conductivity: Machine : 14.1 Dialysis Fluid Bolus: Normal Saline Bolus Amount (mL): 250 mL Intra-Hemodialysis Comments: 1549ml.Marland KitchenMarland KitchenMarland KitchenTx completed.   Medications:     . amLODipine  5 mg Oral Daily  . aspirin EC  81 mg Oral Daily  . calcium acetate  1,334 mg Oral BID BM  . calcium acetate  2,668 mg Oral TID WC  . carvedilol  25 mg Oral BID  . cloNIDine  0.1 mg Oral BID  . fluticasone  1 spray Each Nare Daily  . insulin aspart  0-9 Units Subcutaneous TID WC  . insulin detemir  5 Units Subcutaneous Daily  . pregabalin  75 mg Oral Daily  . rosuvastatin  40 mg Oral Daily  . sodium chloride  3 mL Intravenous Q12H   sodium chloride, acetaminophen, albuterol, nitroGLYCERIN, sodium chloride  Assessment/ Plan:  59 y.o. female hypertension, end-stage renal disease on hemodialysis, coronary artery disease status post cardiac catheterization 05/02/2015, COPD, peripheral vascular disease, sickle cell trait, anemia of chronic kidney disease, diabetes mellitus, hyperlipidemia, left upper extremity AV fistula, carotid artery stenosis, history of tobacco abuse  1. End-stage renal disease on hemodialysis.  Patient had hemodialysis yesterday. She tolerated this well. No acute indication for dialysis today. We will plan for dialysis again tomorrow if patient is still here.  2. Hypertension. Blood pressure elevated at 155/58 and contributing to her underlying chronic diastolic heart failure. We will increase amlodipine to 10 mg by mouth daily. Continue clonidine and Coreg at the current doses.  3. Anemia of chronic kidney disease. Hemoglobin 8.9. We will need to continue Epogen with  dialysis.  4. Secondary hyperparathyroidism. Check intact PTH and phosphorus with next dialysis treatment.  5.  Coronary artery disease:  Had 1 vessel CAD based upon cardiac catherization yesterday.  Management per Dr. Fletcher Anon.    LOS: 1 Theresa [name] 11/24/201610:22 AM

## 2015-05-03 NOTE — Progress Notes (Signed)
MD Benjie Karvonen was notified that pt was stuck 8 times for IV for CT chest and unable to get. No further orders.

## 2015-05-03 NOTE — Care Management Obs Status (Signed)
[place] NOTIFICATION   Patient Details  Name: Theresa [name] MRN: [id number] Date of Birth: 1955/11/15   Medicare Observation Status Notification Given:  Yes Given to pt on unit.    Beau Fanny, RN 05/03/2015, 9:16 AM

## 2015-05-04 ENCOUNTER — Observation Stay: Payer: Medicare Other

## 2015-05-04 DIAGNOSIS — Z992 Dependence on renal dialysis: Secondary | ICD-10-CM | POA: Diagnosis not present

## 2015-05-04 DIAGNOSIS — E119 Type 2 diabetes mellitus without complications: Secondary | ICD-10-CM | POA: Diagnosis not present

## 2015-05-04 DIAGNOSIS — N186 End stage renal disease: Secondary | ICD-10-CM | POA: Diagnosis not present

## 2015-05-04 DIAGNOSIS — R0609 Other forms of dyspnea: Secondary | ICD-10-CM | POA: Diagnosis not present

## 2015-05-04 DIAGNOSIS — I10 Essential (primary) hypertension: Secondary | ICD-10-CM

## 2015-05-04 DIAGNOSIS — N2581 Secondary hyperparathyroidism of renal origin: Secondary | ICD-10-CM | POA: Diagnosis not present

## 2015-05-04 DIAGNOSIS — I25111 Atherosclerotic heart disease of native coronary artery with angina pectoris with documented spasm: Secondary | ICD-10-CM | POA: Diagnosis not present

## 2015-05-04 DIAGNOSIS — R0989 Other specified symptoms and signs involving the circulatory and respiratory systems: Secondary | ICD-10-CM | POA: Diagnosis not present

## 2015-05-04 DIAGNOSIS — D631 Anemia in chronic kidney disease: Secondary | ICD-10-CM | POA: Diagnosis not present

## 2015-05-04 DIAGNOSIS — R0602 Shortness of breath: Secondary | ICD-10-CM | POA: Diagnosis not present

## 2015-05-04 LAB — CBC
HCT: 27.4 % — ABNORMAL LOW (ref 35.0–47.0)
HEMOGLOBIN: 9 g/dL — AB (ref 12.0–16.0)
MCH: 34.7 pg — ABNORMAL HIGH (ref 26.0–34.0)
MCHC: 32.7 g/dL (ref 32.0–36.0)
MCV: 105.8 fL — ABNORMAL HIGH (ref 80.0–100.0)
PLATELETS: 134 10*3/uL — AB (ref 150–440)
RBC: 2.59 MIL/uL — AB (ref 3.80–5.20)
RDW: 17 % — ABNORMAL HIGH (ref 11.5–14.5)
WBC: 8.7 10*3/uL (ref 3.6–11.0)

## 2015-05-04 LAB — BASIC METABOLIC PANEL
ANION GAP: 11 (ref 5–15)
BUN: 55 mg/dL — ABNORMAL HIGH (ref 6–20)
CALCIUM: 8.7 mg/dL — AB (ref 8.9–10.3)
CHLORIDE: 98 mmol/L — AB (ref 101–111)
CO2: 28 mmol/L (ref 22–32)
Creatinine, Ser: 7.75 mg/dL — ABNORMAL HIGH (ref 0.44–1.00)
GFR calc non Af Amer: 5 mL/min — ABNORMAL LOW (ref >60)
GFR, EST AFRICAN AMERICAN: 6 mL/min — AB (ref >60)
GLUCOSE: 106 mg/dL — AB (ref 65–99)
Potassium: 5.6 mmol/L — ABNORMAL HIGH (ref 3.5–5.1)
Sodium: 137 mmol/L (ref 135–145)

## 2015-05-04 LAB — PHOSPHORUS: PHOSPHORUS: 4.8 mg/dL — AB (ref 2.5–4.6)

## 2015-05-04 LAB — GLUCOSE, CAPILLARY
GLUCOSE-CAPILLARY: 214 mg/dL — AB (ref 65–99)
Glucose-Capillary: 122 mg/dL — ABNORMAL HIGH (ref 65–99)

## 2015-05-04 MED ORDER — HYDRALAZINE HCL 25 MG PO TABS
25.0000 mg | ORAL_TABLET | Freq: Three times a day (TID) | ORAL | Status: DC
Start: 2015-05-04 — End: 2015-05-04
  Administered 2015-05-04: 25 mg via ORAL
  Filled 2015-05-04: qty 1

## 2015-05-04 MED ORDER — TECHNETIUM TC 99M DIETHYLENETRIAME-PENTAACETIC ACID
32.9600 | Freq: Once | INTRAVENOUS | Status: AC | PRN
  Administered 2015-05-04: 32.96 via INTRAVENOUS

## 2015-05-04 MED ORDER — HYDRALAZINE HCL 25 MG PO TABS: 25.0000 mg | ORAL_TABLET | Freq: Three times a day (TID) | ORAL | Status: DC

## 2015-05-04 MED ORDER — TECHNETIUM TO 99M ALBUMIN AGGREGATED
4.0000 | Freq: Once | INTRAVENOUS | Status: AC | PRN
  Administered 2015-05-04: 4.34 via INTRAVENOUS

## 2015-05-04 MED ORDER — BENZONATATE 200 MG PO CAPS: 200.0000 mg | ORAL_CAPSULE | Freq: Three times a day (TID) | ORAL | Status: DC | PRN

## 2015-05-04 MED ORDER — EPOETIN ALFA 10000 UNIT/ML IJ SOLN: 10000.0000 [IU] | INTRAMUSCULAR | Status: DC

## 2015-05-04 MED ORDER — LEVOFLOXACIN 250 MG PO TABS: 250.0000 mg | ORAL_TABLET | ORAL | Status: DC

## 2015-05-04 NOTE — Progress Notes (Addendum)
2 L of oxygen. First degree heart block. HD today. Pt reports no pain. IV and tele removed. Discharge instrucitons given to pt. Oxygen tank delivered to pt. Pt ambulated around the nurses station and tolerated it well. Daughter to take pt home. Pt has no further concerns at this time.

## 2015-05-04 NOTE — Progress Notes (Signed)
Patient with two episodes of dyspnea. PRN SVN, cough medication and Acetaminophen administered at both times. Patient stated symptoms improved.

## 2015-05-04 NOTE — Progress Notes (Signed)
[facility]  ROUNDING NOTE   Subjective:  Patient seen at bedside. Continues to have a cough. Due for dialysis again today. O2 saturation 100%. Potassium noted to be high at 5.6 today.  Objective:  Vital signs in last 24 hours:  Temp:  [98.5 F (36.9 C)-99.2 F (37.3 C)] 98.5 F (36.9 C) (11/25 0419) Pulse Rate:  [73-79] 73 (11/25 0419) Resp:  [18] 18 (11/25 0419) BP: (158-184)/(56-61) 177/61 mmHg (11/25 0419) SpO2:  [96 %-100 %] 100 % (11/25 0419)  Weight change:  Filed Weights   05/02/15 1812 05/02/15 1815 05/02/15 2049  Weight: 77.066 kg (169 lb 14.4 oz) 77.066 kg (169 lb 14.4 oz) 76.2 kg (167 lb 15.9 oz)    Intake/Output: I/O last 3 completed shifts: In: 67 [P.O.:360; I.V.:5] Out: 1050 [Urine:50; Other:1000]   Intake/Output this shift:  Total I/O In: -  Out: 50 [Urine:50]  Physical Exam: General: NAD  Head: Normocephalic, atraumatic. Moist oral mucosal membranes  Eyes: Anicteric, PERRL  Neck: Supple, trachea midline  Lungs:  Mild basilar rales  Heart: Regular rate and rhythm no rubs  Abdomen:  Soft, nontender, BS present  Extremities:  trace peripheral edema.  Neurologic: Nonfocal, moving all four extremities  Skin: No lesions  Access: LUE AVF    Basic Metabolic Panel:  Recent Labs Lab 05/02/15 1153 05/03/15 0816 05/04/15 0522  NA 141 140 137  K 4.4 4.7 5.6*  CL 100* 98* 98*  CO2 32 32 28  GLUCOSE 116* 103* 106*  BUN 35* 34* 55*  CREATININE 6.80* 5.80* 7.75*  CALCIUM 8.4* 8.3* 8.7*    Liver Function Tests: No results for input(s): AST, ALT, ALKPHOS, BILITOT, PROT, ALBUMIN in the last 168 hours. No results for input(s): LIPASE, AMYLASE in the last 168 hours. No results for input(s): AMMONIA in the last 168 hours.  CBC:  Recent Labs Lab 05/02/15 1153 05/03/15 0816  WBC 9.1 7.6  HGB 9.4* 8.9*  HCT 28.1* 27.2*  MCV 104.6* 105.8*  PLT 153 137*    Cardiac Enzymes:  Recent Labs Lab 05/02/15 1153  TROPONINI 0.05*     BNP: Invalid input(s): POCBNP  CBG:  Recent Labs Lab 05/03/15 0731 05/03/15 1115 05/03/15 1611 05/03/15 2107 05/04/15 0731  GLUCAP 102* 210* 106* 166* 122*    Microbiology: Results for orders placed or performed in visit on 02/13/14  Culture, blood (single)     Status: None   Collection Time: 02/13/14  3:00 AM  Result Value Ref Range Status   Micro Text Report   Final       COMMENT                   NO GROWTH AEROBICALLY/ANAEROBICALLY IN 5 DAYS   ANTIBIOTIC                                                      Culture, blood (single)     Status: None   Collection Time: 02/13/14  3:15 AM  Result Value Ref Range Status   Micro Text Report   Final       COMMENT                   NO GROWTH AEROBICALLY/ANAEROBICALLY IN 5 DAYS   ANTIBIOTIC  Coagulation Studies:  Recent Labs  05/02/15 1153  LABPROT 15.1*  INR 1.17    Urinalysis: No results for input(s): COLORURINE, LABSPEC, PHURINE, GLUCOSEU, HGBUR, BILIRUBINUR, KETONESUR, PROTEINUR, UROBILINOGEN, NITRITE, LEUKOCYTESUR in the last 72 hours.  Invalid input(s): APPERANCEUR    Imaging: Dg Chest 2 View  05/02/2015  CLINICAL DATA:  Shortness of breath and chest pain. Pre catheterization. EXAM: CHEST  2 VIEW COMPARISON:  11/23/2014. FINDINGS: Trachea is midline. Heart is at the upper limits normal in size. There may be mild diffuse interstitial prominence and indistinctness. No pleural fluid. IMPRESSION: Question mild edema. Electronically Signed   By: Lorin Picket M.D.   On: 05/02/2015 12:53   Dg Chest Port 1 View  05/04/2015  CLINICAL DATA:  Short of breath and chest pain EXAM: PORTABLE CHEST 1 VIEW COMPARISON:  05/02/2015 FINDINGS: Mild cardiac enlargement. Mild vascular congestion without edema. Small pleural effusions bilaterally. Mild right lower lobe atelectasis. IMPRESSION: Question mild fluid overload similar to the prior study. Negative for  pneumonia. Electronically Signed   By: Franchot Gallo M.D.   On: 05/04/2015 08:30     HEMODIALYSIS FLOWSHEET:  Blood Flow Rate (mL/min): 400 mL/min Arterial Pressure (mmHg): -160 mmHg Venous Pressure (mmHg): 160 mmHg Transmembrane Pressure (mmHg): 60 mmHg Ultrafiltration Rate (mL/min): 770 mL/min Dialysate Flow Rate (mL/min): 800 ml/min Conductivity: Machine : 14.1 Conductivity: Machine : 14.1 Dialysis Fluid Bolus: Normal Saline Bolus Amount (mL): 250 mL Intra-Hemodialysis Comments: 1559ml.Marland KitchenMarland KitchenMarland KitchenTx completed.   Medications:     . amLODipine  10 mg Oral Daily  . aspirin EC  81 mg Oral Daily  . calcium acetate  1,334 mg Oral BID BM  . calcium acetate  2,668 mg Oral TID WC  . carvedilol  25 mg Oral BID  . cloNIDine  0.1 mg Oral BID  . fluticasone  1 spray Each Nare Daily  . insulin aspart  0-9 Units Subcutaneous TID WC  . insulin detemir  5 Units Subcutaneous Daily  . [START ON 05/05/2015] levofloxacin  250 mg Oral Q48H  . pregabalin  75 mg Oral Daily  . rosuvastatin  40 mg Oral Daily   acetaminophen, albuterol, benzonatate, nitroGLYCERIN, sodium chloride  Assessment/ Plan:  59 y.o. female hypertension, end-stage renal disease on hemodialysis, coronary artery disease status post cardiac catheterization 05/02/2015, COPD, peripheral vascular disease, sickle cell trait, anemia of chronic kidney disease, diabetes mellitus, hyperlipidemia, left upper extremity AV fistula, carotid artery stenosis, history of tobacco abuse  1. End-stage renal disease on hemodialysis. Patient due for dialysis today. Orders have been prepared.  2. Hypertension. Blood pressure continues to be high at this time. She is currently on amlodipine, Coreg, clonidine. We will add hydralazine 25 mg by mouth every 8 hours.  3. Anemia of chronic kidney disease. Continue Epogen 10,000 units IV with dialysis  4. Secondary hyperparathyroidism. Check intact PTH and phosphorus with next dialysis treatment.  5.   Coronary artery disease:  Had 1 vessel CAD based upon cardiac catherization yesterday.  Management per Dr. Fletcher Anon.    LOS: 2 Halima Fogal 11/25/20169:51 AM

## 2015-05-04 NOTE — Progress Notes (Signed)
SATURATION QUALIFICATIONS: (This note is used to comply with regulatory documentation for home oxygen)  Patient Saturations on Room Air at Rest =93%  Patient Saturations on Room Air while Ambulating 86%  Patient Saturations on 2 Liters of oxygen while Ambulating =95%  Please briefly explain why patient needs home oxygen: 

## 2015-05-04 NOTE — Discharge Instructions (Signed)
Coronary Artery Disease, Female  Coronary artery disease (CAD) is a process in which the blood vessels of the heart (coronary arteries) become narrow or blocked. The narrowing or blockage can lead to decreased blood flow to the heart muscle (angina). Symptoms known as angina can develop if the blood flow is reduced to the heart for a short period of time. Prolonged reduced blood flow can cause a heart attack (myocardial infarction, MI).  CAD is a leading cause of death for women. More women die from CAD than from cancer, lung disease, and accidents combined. It is important for women to understand the risks, symptoms, and treatment options for CAD.  CAUSES  Atherosclerosis is the cause of CAD. Atherosclerosis is the buildup of fat and cholesterol (plaque) on the inside of the arteries. Over time, the plaque may narrow or block the artery, and this will lessen blood flow to the heart. Plaque can also become weak and break off within a coronary artery to form a clot and cause a sudden blockage.  RISK FACTORS  Many risk factors increase your chances of getting CAD, including:  · High cholesterol levels.  · High blood pressure (hypertension).  · Tobacco use.  · Diabetes.  · Age. Women over age 55 are at a greater risk of CAD.  · Menopause.    All postmenopausal women are at greater risk of CAD.    Women who have experienced menopause between the ages of 40-45 (early menopause) are at a higher risk of CAD.    Women who have experienced menopause before age 40 (premature menopause) are at an extremely high risk of CAD.  · Family history of CAD.  · Obesity.  · Lack of exercise.  · A diet high in saturated fats.  SYMPTOMS   Many people do not experience any symptoms during the early stages of CAD. As the condition progresses, symptoms may include:  · Chest pain.    The pain can be described as crushing or squeezing, or a tightness, pressure, fullness, or heaviness in the chest.    The pain can last more than a few minutes  or can stop and recur.  · Pain in the arms, neck, jaw, or back.  · Unexplained heartburn or indigestion.  · Shortness of breath.  · Nausea.  · Sudden cold sweats.  · Sudden light-headedness.  Many women have chest discomfort and the other symptoms. However, women often have different (atypical) symptoms, such as:  · Fatigue.  · Unexplained feelings of nervousness or anxiety.  · Unexplained weakness.  · Dizziness or fainting.  Sometimes, women may not have any symptoms of CAD.  DIAGNOSIS   Tests to diagnose CAD may include:  · ECG (electrocardiogram).  · Exercise stress test. This looks for signs of blockage when the heart is being exercised.  · Pharmacologic stress test. This test looks for signs of blockage when the heart is being stressed with a medicine.  · Blood tests.  · Coronary angiogram. This is a procedure to look at the coronary arteries to see if there is any blockage.  TREATMENT  The treatment of CAD may include the following:  · Healthy behavioral changes to reduce or control risk factors.  · Medicine.  · Coronary stenting. A stent helps to keep an artery open.  · Coronary angioplasty. This procedure widens a narrowed or blocked artery.  · Coronary artery bypass surgery. This will allow your blood to pass the blockage (bypass) to reach your heart.  HOME CARE INSTRUCTIONS  ·   Take medicines only as directed by your health care provider.  · Do not take the following medicines unless your health care provider approves:    Nonsteroidal anti-inflammatory drugs (NSAIDs), such as ibuprofen, naproxen, or celecoxib.    Vitamin supplements that contain vitamin A, vitamin E, or both.    Hormone replacement therapy that contains estrogen with or without progestin.  · Manage other health conditions such as hypertension and diabetes as directed by your health care provider.  · Follow a heart-healthy diet. A dietitian can help to educate you about healthy food options and changes.  · Use healthy cooking methods such as  roasting, grilling, broiling, baking, poaching, steaming, or stir-frying. Talk to a dietitian to learn more about healthy cooking methods.  · Follow an exercise program approved by your health care provider.  · Maintain a healthy weight. Lose weight as approved by your health care provider.  · Plan rest periods when fatigued.  · Learn to manage stress.  · Do not use any tobacco products, including cigarettes, chewing tobacco, or electronic cigarettes. If you need help quitting, ask your health care provider.  · If you drink alcohol, and your health care provider approves, limit your alcohol intake to no more than 1 drink per day. One drink equals 12 ounces of beer, 5 ounces of wine, or 1½ ounces of hard liquor.  · Stop illegal drug use.  · Your health care provider may ask you to monitor your blood pressure. A blood pressure reading consists of a higher number over a lower number, such as 110 over 72, which is written as 110/72. Ideally, your blood pressure should be:    Below 140/90 if you have no other medical conditions.    Below 130/80 if you have diabetes or kidney disease.  · Keep all follow-up visits as directed by your health care provider. This is important.  SEEK IMMEDIATE MEDICAL CARE IF:  · You have pain in your chest, neck, arm, jaw, stomach, or back that lasts more than a few minutes, is recurring, or is unrelieved by taking medicine under your tongue (sublingual nitroglycerin).  · You have profuse sweating without cause.  · You have unexplained:    Heartburn or indigestion.    Shortness of breath or difficulty breathing.    Nausea or vomiting.    Fatigue.    Feelings of nervousness or anxiety.    Weakness.    Diarrhea.  · You have sudden light-headedness or dizziness.  · You faint.  These symptoms may represent a serious problem that is an emergency. Do not wait to see if the symptoms will go away. Get medical help right away. Call your local emergency services (911 in the [place] Do not drive yourself  to the hospital.     This information is not intended to replace advice given to you by your health care provider. Make sure you discuss any questions you have with your health care provider.     Document Released: 08/18/2011 Document Revised: 06/16/2014 Document Reviewed: 09/27/2013  Elsevier Interactive Patient Education ©2016 [employer]

## 2015-05-04 NOTE — Care Management (Signed)
Informed by primary nurse that patient is dropping her oxygen sats to 86% on room air with ambulation.  She does have chronic respiratory diagnosis.  She is agreeable for home 02 and home health nurse.  agency preference is [facility]. Referral called to [facility].  Confirmed patient's PCP- Ronette Deter and is current.  Confirmed patient address and phone number.

## 2015-05-04 NOTE — Care Management (Signed)
Patient admitted under observation for shortness of breath.  Mild bump in troponin.  Does not use chronic 02 at home.  Followed by Towanda Octave on [address] [facility] [address] [facility].  Uses [facility] for transportation

## 2015-05-04 NOTE — Discharge Summary (Addendum)
[facility] [place] [facility] NAME: Theresa [name]    MR#:  [id number]  DATE OF BIRTH:  [birth year]  DATE OF ADMISSION:  05/02/2015 ADMITTING PHYSICIAN: Vaughan Basta, MD  DATE OF DISCHARGE: No discharge date for patient encounter.  PRIMARY CARE PHYSICIAN: Rica Mast, MD    ADMISSION DIAGNOSIS:  Dyspnea on exertion [R06.09] NSTEMI (non-ST elevated myocardial infarction) ([place]) [I21.4]  DISCHARGE DIAGNOSIS:  Active Problems:   NSTEMI (non-ST elevated myocardial infarction) ([place])   Unstable angina (HCC)   ESRD (end stage renal disease) (HCC)   Dyspnea on exertion   Chest rales   Cephalalgia   Coronary artery disease involving native coronary artery of native heart with angina pectoris with documented spasm (HCC)   S/P coronary artery stent placement   Hypertension secondary to other renal disorders   SECONDARY DIAGNOSIS:   Past Medical History  Diagnosis Date  . Hypertension   . ESRD on hemodialysis ([place])     a. [facility] in Jan [place], [place], on a TTS schedule.  She started dialysis in Feb 2014.  Etiology of renal failure not known, likely diabetes.  Has a left upper arm AV graft.  Marland Kitchen COPD (chronic obstructive pulmonary disease) ([place])   . Emphysema   . History of pneumonia     June 2012  . History of bronchitis     Mar 2012  . Peripheral vascular disease ([place])   . Chronic constipation   . Colon polyps   . Sickle cell trait ([place])   . Anemia of chronic disease   . Diabetes mellitus   . Moderate mitral insufficiency     a. 10/2013 Echo: EF 55-60%, mod MR.  . Diabetic neuropathy ([place])   . Diabetic retinopathy ([place]) 05/28/2013    Hx bilat retinal detachment, proliferative diab retinopathy and bilat vitreous hemorrhage   . Hyperlipidemia   . Coronary artery disease     a. 05/2013 NSTEMI/PCI: LM 20d, LAD min irregs, LCX small, nl, OM1 nl, RCA dom 63m(2.5x16 Promus DES), PDA1 80p.  . Chronic diastolic CHF (congestive  heart failure) ([place]     a. 10/2013 Echo ([facility]: EF 55-60%, mod conc LVH, mod MR, mildly dil LA, mild Ao sclerosis w/o stenosis.  . Carotid arterial disease ([place]     a. 02/2013 U/S: 40-59% bilat ICA stenosis - *f/u 02/2014*  . History of tobacco abuse     a. Quit 2012.    HOSPITAL COURSE:  59y.o. female with a known history of hypertension, COPD, hyperlipidemia, diabetes, coronary artery disease status post stent in 2014, end-stage renal disease on hemodialysis, diabetic neuropathy, diabetic retinopathy was admitted [facility] and worsening shortness of breath.  Please see Dr. VRaliegh Ipdictated history and physical for further details.  Radiology evaluated her and recommended Cardiac cath was performed on 11/23 and showed no significant stenosis, medical management was recommended. She continues to have SOB.  For which she underwent VQ scan which showed no likelihood of PE.  Chest x-ray also did not show any indications of pneumonia, but patient continued to have symptoms of cough and shortness of breath , although this was improving with inpatient hemodialysis.  She was feeling much better by 25th of November after another dialysis session and is being discharged home in stable condition.  We will give her empiric antibiotics for possible bronchitis.  She is agreeable with the discharge  plans. DISCHARGE CONDITIONS:   stable  CONSULTS OBTAINED:  Treatment Team:  Murlean Iba, MD Minna Merritts, MD  DRUG ALLERGIES:   Allergies  Allergen Reactions  . Metformin Diarrhea  . Hydrocodone Hives    DISCHARGE MEDICATIONS:   Current Discharge Medication List    START taking these medications   Details  amLODipine (NORVASC) 10 MG tablet Take 1 tablet (10 mg total) by mouth daily. Qty: 30 tablet, Refills: 0    benzonatate (TESSALON) 200 MG capsule Take 1 capsule (200 mg total) by mouth 3 (three)  times daily as needed for cough. Qty: 20 capsule, Refills: 0    !! calcium acetate (PHOSLO) 667 MG capsule Take 2 capsules (1,334 mg total) by mouth 2 (two) times daily between meals. Qty: 90 capsule, Refills: 0    hydrALAZINE (APRESOLINE) 25 MG tablet Take 1 tablet (25 mg total) by mouth every 8 (eight) hours. Qty: 90 tablet, Refills: 0    levofloxacin (LEVAQUIN) 250 MG tablet Take 1 tablet (250 mg total) by mouth every other day. Qty: 5 tablet, Refills: 0     !! - Potential duplicate medications found. Please discuss with provider.    CONTINUE these medications which have NOT CHANGED   Details  albuterol (PROVENTIL HFA;VENTOLIN HFA) 108 (90 BASE) MCG/ACT inhaler Inhale 2 puffs into the lungs every 6 (six) hours as needed for wheezing or shortness of breath.    aspirin EC 81 MG tablet Take 81 mg by mouth daily.    BAYER CONTOUR TEST test strip TEST BLOOD SUGARS 2 TIMES DAILY Qty: 100 each, Refills: PRN    Blood Glucose Monitoring Suppl (ONE TOUCH ULTRA SYSTEM KIT) W/DEVICE KIT Please dispense for patient to use at home to check blood sugars. Qty: 1 each, Refills: 0   Associated Diagnoses: Type II or unspecified type diabetes mellitus without mention of complication, not stated as uncontrolled    !! calcium acetate (PHOSLO) 667 MG capsule Take 667-2,668 mg by mouth 5 (five) times daily. Pt takes four capsules with meals and two capsules with snacks.    carvedilol (COREG) 12.5 MG tablet Take 1 tablet (12.5 mg total) by mouth 2 (two) times daily. Qty: 60 tablet, Refills: 6   Associated Diagnoses: Essential hypertension, benign    cloNIDine (CATAPRES) 0.1 MG tablet Take 1 tablet (0.1 mg total) by mouth 2 (two) times daily. Qty: 60 tablet, Refills: 11    Insulin Detemir (LEVEMIR) 100 UNIT/ML Pen Inject 5 Units into the skin daily. Qty: 15 mL, Refills: 3    Lancets Thin MISC Using at home to test blood sugars twice daily. Qty: 200 each, Refills: 6   Associated Diagnoses: Type II  or unspecified type diabetes mellitus without mention of complication, not stated as uncontrolled    mometasone (NASONEX) 50 MCG/ACT nasal spray Place 2 sprays into the nose 2 (two) times daily as needed (for allergies).    nitroGLYCERIN (NITROSTAT) 0.4 MG SL tablet Place 1 tablet (0.4 mg total) under the tongue every 5 (five) minutes as needed for chest pain. Qty: 25 tablet, Refills: 3    NOVOFINE 32G X 6 MM MISC USE AS DIRECTED Qty: 100 each, Refills: 0    pregabalin (LYRICA) 75 MG capsule Take 1 capsule (75 mg total) by mouth daily. Qty: 30 capsule, Refills: 3    rosuvastatin (CRESTOR) 40 MG tablet Take 1 tablet (40 mg total) by mouth daily. Qty: 90 tablet, Refills: 3     !! - Potential duplicate medications found. Please discuss  with provider.       DISCHARGE INSTRUCTIONS:   DIET:  Renal diet  DISCHARGE CONDITION:  Good  ACTIVITY:  Activity as tolerated  OXYGEN:  Home Oxygen: Yes.     Oxygen Delivery: 2 liters via N.C. Mainly on ambulation and as need  DISCHARGE LOCATION:  home   If you experience worsening of your admission symptoms, develop shortness of breath, life threatening emergency, suicidal or homicidal thoughts you must seek medical attention immediately by calling 911 or calling your MD immediately  if symptoms less severe.  You Must read complete instructions/literature along with all the possible adverse reactions/side effects for all the Medicines you take and that have been prescribed to you. Take any new Medicines after you have completely understood and accpet all the possible adverse reactions/side effects.   Please note  You were cared for by a hospitalist during your hospital stay. If you have any questions about your discharge medications or the care you received while you were in the hospital after you are discharged, you can call the unit and asked to speak with the hospitalist on call if the hospitalist that took care of you is not available.  Once you are discharged, your primary care physician will handle any further medical issues. Please note that NO REFILLS for any discharge medications will be authorized once you are discharged, as it is imperative that you return to your primary care physician (or establish a relationship with a primary care physician if you do not have one) for your aftercare needs so that they can reassess your need for medications and monitor your lab values.    On the day of Discharge:   VITAL SIGNS:  Blood pressure 160/59, pulse 71, temperature 98 F (36.7 C), temperature source Oral, resp. rate 21, height 5' 7.5" (1.715 m), weight 79.2 kg (174 lb 9.7 oz), SpO2 100 %. PHYSICAL EXAMINATION:  GENERAL:  59 y.o.-year-old patient lying in the bed with no acute distress.  EYES: Pupils equal, round, reactive to light and accommodation. No scleral icterus. Extraocular muscles intact.  HEENT: Head atraumatic, normocephalic. Oropharynx and nasopharynx clear.  NECK:  Supple, no jugular venous distention. No thyroid enlargement, no tenderness.  LUNGS: Normal breath sounds bilaterally, no wheezing, rales,rhonchi or crepitation. No use of accessory muscles of respiration.  CARDIOVASCULAR: S1, S2 normal. No murmurs, rubs, or gallops.  ABDOMEN: Soft, non-tender, non-distended. Bowel sounds present. No organomegaly or mass.  EXTREMITIES: No pedal edema, cyanosis, or clubbing.  NEUROLOGIC: Cranial nerves II through XII are intact. Muscle strength 5/5 in all extremities. Sensation intact. Gait not checked.  PSYCHIATRIC: The patient is alert and oriented x 3.  SKIN: No obvious rash, lesion, or ulcer.   DATA REVIEW:   CBC  Recent Labs Lab 05/04/15 0523  WBC 8.7  HGB 9.0*  HCT 27.4*  PLT 134*    Chemistries   Recent Labs Lab 05/04/15 0522  NA 137  K 5.6*  CL 98*  CO2 28  GLUCOSE 106*  BUN 55*  CREATININE 7.75*  CALCIUM 8.7*    Cardiac Enzymes  Recent Labs Lab 05/02/15 1153  TROPONINI 0.05*     Microbiology Results  Results for orders placed or performed in visit on 02/13/14  Culture, blood (single)     Status: None   Collection Time: 02/13/14  3:00 AM  Result Value Ref Range Status   Micro Text Report   Final       COMMENT  NO GROWTH AEROBICALLY/ANAEROBICALLY IN 5 DAYS   ANTIBIOTIC                                                      Culture, blood (single)     Status: None   Collection Time: 02/13/14  3:15 AM  Result Value Ref Range Status   Micro Text Report   Final       COMMENT                   NO GROWTH AEROBICALLY/ANAEROBICALLY IN 5 DAYS   ANTIBIOTIC                                                        RADIOLOGY:  Nm Pulmonary Perf And Vent  05/04/2015  CLINICAL DATA:  Shortness of breath. EXAM: NUCLEAR MEDICINE VENTILATION - PERFUSION LUNG SCAN TECHNIQUE: Ventilation images were obtained in multiple projections using inhaled aerosol Tc-67mDTPA. Perfusion images were obtained in multiple projections after intravenous injection of Tc-948mAA. RADIOPHARMACEUTICALS:  3209.628illicuries of TeZMOQHUTMLY-65KTPA aerosol inhalation and 4.3.546illicuries of TeFKCLEXNTZG-01VAA IV COMPARISON:  Chest x-ray dated 05/04/2015 FINDINGS: Ventilation: No focal ventilation defect. Normal ventilation to both lungs. Perfusion: No wedge shaped peripheral perfusion defects to suggest acute pulmonary embolism. Normal perfusion to both lungs. IMPRESSION: Normal ventilation-perfusion lung scan. No findings of pulmonary embolism. Electronically Signed   By: JaLorriane Shire.D.   On: 05/04/2015 11:56   Dg Chest Port 1 View  05/04/2015  CLINICAL DATA:  Short of breath and chest pain EXAM: PORTABLE CHEST 1 VIEW COMPARISON:  05/02/2015 FINDINGS: Mild cardiac enlargement. Mild vascular congestion without edema. Small pleural effusions bilaterally. Mild right lower lobe atelectasis. IMPRESSION: Question mild fluid overload similar to the prior study. Negative for pneumonia.  Electronically Signed   By: ChFranchot Gallo.D.   On: 05/04/2015 08:30     Management plans discussed with the patient, family and they are in agreement.  CODE STATUS:     Code Status Orders        Start     Ordered   05/02/15 1648  Full code   Continuous     05/02/15 1647      TOTAL TIME TAKING CARE OF THIS PATIENT: 55 minutes.    SHNorth Florida Regional Medical CenterVIPUL M.D on 05/04/2015 at 3:40 PM  Between 7am to 6pm - Pager - [phone]  After 6pm go to [email or web address] - password [email or web address]  Office  [phone]: Primary care physician; WARica MastMD TiEsmond Plants.D., Ph.D.

## 2015-05-04 NOTE — Consult Note (Signed)
   [facility] CM Inpatient Consult   05/04/2015  YONINA BASTION 03-12-1956 [id number]   EPIC referral received for [facility] Care Management services. Multiple attempts were made to engage for [place] [facility] services in the past. However, attempts were unsuccessful and case was closed due to unable to reach. Please see chart review tab then notes for patient outreach attempts. Today, called into patient's room to discuss and explain [facility] Care Management services. Patient states she is familiar with [facility] and endorses that she was unavailable for past phone calls. She states she is interested in [facility] services and would like to try again. Verbal consent received. Explained that she will receive post hospital transition of care calls and will be evaluated for monthly home visits. Confirms best contact number is [phone]. She also reports she goes to HD on Mondays, Wednesdays, and Fridays at 230 pm. Will request for patient to be assigned again to [place]. Will make inpatient RNCM aware that [facility] will follow post discharge.  Marthenia Rolling, MSN-Ed, RN,BSN [facility] Liaison [phone]

## 2015-05-04 NOTE — Progress Notes (Addendum)
Patient: Theresa [name] / Admit Date: 05/02/2015 / Date of Encounter: 05/04/2015, 8:59 AM   Subjective: Improved SOB. Ambulated throughout the hallway with some SOB. She is for VQ scan today.   Review of Systems: Review of Systems  Constitutional: Positive for malaise/fatigue. Negative for fever, chills, weight loss and diaphoresis.  HENT: Negative for congestion.   Eyes: Negative for discharge and redness.  Respiratory: Positive for cough and shortness of breath. Negative for hemoptysis, sputum production and wheezing.        Improved  Cardiovascular: Negative for chest pain, palpitations, orthopnea, claudication, leg swelling and PND.  Gastrointestinal: Negative for nausea and vomiting.  Musculoskeletal: Negative for falls.  Skin: Negative for rash.  Neurological: Positive for weakness. Negative for dizziness, tingling, tremors, sensory change, speech change, focal weakness and loss of consciousness.  Endo/Heme/Allergies: Does not bruise/bleed easily.  Psychiatric/Behavioral: The patient is nervous/anxious.      Objective: Telemetry: NSR, 70's Physical Exam: Blood pressure 177/61, pulse 79, temperature 98.5 F (36.9 C), temperature source Oral, resp. rate 18, height 5' 7.5" (1.715 m), weight 167 lb 15.9 oz (76.2 kg), SpO2 100 %. Body mass index is 25.91 kg/(m^2). General: Well developed, well nourished, in no acute distress. Head: Normocephalic, atraumatic, sclera non-icteric, no xanthomas, nares are without discharge. Neck: Negative for carotid bruits. JVP not elevated. Lungs: Mildly decreased breath sounds along the bilateral bases. Breathing is unlabored. Heart: RRR S1 S2 II/VI systolic murmurs LUSB. No rubs, or gallops.  Abdomen: Soft, non-tender, non-distended with normoactive bowel sounds. No rebound/guarding. Extremities: No clubbing or cyanosis. No edema. Distal pedal pulses are 2+ and equal bilaterally. Neuro: Alert and oriented X 3. Moves all extremities  spontaneously. Psych:  Responds to questions appropriately with a normal affect.   Intake/Output Summary (Last 24 hours) at 05/04/15 0859 Last data filed at 05/04/15 0802  Gross per 24 hour  Intake    240 ml  Output     50 ml  Net    190 ml    Inpatient Medications:  . amLODipine  10 mg Oral Daily  . aspirin EC  81 mg Oral Daily  . calcium acetate  1,334 mg Oral BID BM  . calcium acetate  2,668 mg Oral TID WC  . carvedilol  25 mg Oral BID  . cloNIDine  0.1 mg Oral BID  . fluticasone  1 spray Each Nare Daily  . insulin aspart  0-9 Units Subcutaneous TID WC  . insulin detemir  5 Units Subcutaneous Daily  . [START ON 05/05/2015] levofloxacin  250 mg Oral Q48H  . pregabalin  75 mg Oral Daily  . rosuvastatin  40 mg Oral Daily   Infusions:    Labs:  Recent Labs  05/03/15 0816 05/04/15 0522  NA 140 137  K 4.7 5.6*  CL 98* 98*  CO2 32 28  GLUCOSE 103* 106*  BUN 34* 55*  CREATININE 5.80* 7.75*  CALCIUM 8.3* 8.7*   No results for input(s): AST, ALT, ALKPHOS, BILITOT, PROT, ALBUMIN in the last 72 hours.  Recent Labs  05/02/15 1153 05/03/15 0816  WBC 9.1 7.6  HGB 9.4* 8.9*  HCT 28.1* 27.2*  MCV 104.6* 105.8*  PLT 153 137*    Recent Labs  05/02/15 1153  TROPONINI 0.05*   Invalid input(s): POCBNP No results for input(s): HGBA1C in the last 72 hours.   Weights: Filed Weights   05/02/15 1812 05/02/15 1815 05/02/15 2049  Weight: 169 lb 14.4 oz (77.066 kg) 169 lb 14.4  oz (77.066 kg) 167 lb 15.9 oz (76.2 kg)     Radiology/Studies:  Dg Chest 2 View  05/02/2015  CLINICAL DATA:  Shortness of breath and chest pain. Pre catheterization. EXAM: CHEST  2 VIEW COMPARISON:  11/23/2014. FINDINGS: Trachea is midline. Heart is at the upper limits normal in size. There may be mild diffuse interstitial prominence and indistinctness. No pleural fluid. IMPRESSION: Question mild edema. Electronically Signed   By: Lorin Picket M.D.   On: 05/02/2015 12:53   Dg Chest Port 1  View  05/04/2015  CLINICAL DATA:  Short of breath and chest pain EXAM: PORTABLE CHEST 1 VIEW COMPARISON:  05/02/2015 FINDINGS: Mild cardiac enlargement. Mild vascular congestion without edema. Small pleural effusions bilaterally. Mild right lower lobe atelectasis. IMPRESSION: Question mild fluid overload similar to the prior study. Negative for pneumonia. Electronically Signed   By: Franchot Gallo M.D.   On: 05/04/2015 08:30     Assessment and Plan  59 y.o. femalewith CAD s/p PCI/prior stenting to mid RCA in 05/2013, 25 years of smoking, diabetes, ESRD on HD since 07/2012, previously noted to have moderate to severe mitral valve regurgitation on prior echocardiogram (improved to moderate MR on recent echo), pneumonia in June of 2012 with admission overnight to [facility], bilateral carotid artery disease of 40-59% dating back to 02/2013 who was admitted to [facility] with increased dyspnea. She recently had an abnormal stress test done at [facility] as part of her pre-op eval for renal transplant. She was subsequently admitted to [facility] as above. Cardiac cath on 11/23 showed no significant stenosis, medical management was recommended. She continues to have SOB.    1) Shortness of breath: -Improving -Repeat CXR 11/25 showing mild vascular congestion without edema. Small pleural effusions bilaterally. Mild right lower lobe atelectasis. Negative for PNA -She is for VQ scan to evaluate for PE -She does have headache, hoarseness, runny nose now shortness of breath concerning for viral infection, unable to exclude bacterial pneumonia. No significant sputum production so far. -On empiric Levaquin per IM  -Could treat with antibiotics given suspicion of infection and continued symptoms.  2) ESRD on HD: -Followed by nephrology, hemodialysis Monday, Wednesday, Friday -This week she reports having dialysis 3 times a far -Moderate pulmonary hypertension on echocardiogram 04/17/2015 at [facility]  3) CAD: -Prior stent to the RCA is  patent on catheterization 11/23 -Mild nonobstructive disease -False-positive stress test done at [facility]  4) Mitral valve regurgitation -Moderate MR on echo at [facility]  5) HTN: -Coreg up to 25 mg twice a day, increase amlodipine to 10 mg daily, continue clonidine  6) Diabetes type II -Hemoglobin A1c well controlled  7) Hyperlipidemia -Continue Crestor, at goal   Signed, Christell Faith, PA-C Pager: [phone] 05/04/2015, 8:59 AM   Attending Note Patient seen and examined, agree with detailed note above,  Patient presentation and plan discussed on rounds.   She reports that she is starting to feel better on ABX, Ambulated, still SOB but mildly improved She had neb treatments x 2 which also helped. Still with post nasal drip, headache, throat, cough Scheduled for HD today, Suggested she talk with Dr. Benjie Karvonen, If she feels better after HD, Perhaps she could go home on ABX with close follow up in clinic  ---Given improving sx, perhaps could skip V/Q scan (no tachycardia, EKG changes concernign for PE)  Signed: Esmond Plants  M.D., Ph.D. [facility]

## 2015-05-05 LAB — PARATHYROID HORMONE, INTACT (NO CA): PTH: 156 pg/mL — AB (ref 15–65)

## 2015-05-06 DIAGNOSIS — Z794 Long term (current) use of insulin: Secondary | ICD-10-CM | POA: Diagnosis not present

## 2015-05-06 DIAGNOSIS — I214 Non-ST elevation (NSTEMI) myocardial infarction: Secondary | ICD-10-CM | POA: Diagnosis not present

## 2015-05-06 DIAGNOSIS — N186 End stage renal disease: Secondary | ICD-10-CM | POA: Diagnosis not present

## 2015-05-06 DIAGNOSIS — Z992 Dependence on renal dialysis: Secondary | ICD-10-CM | POA: Diagnosis not present

## 2015-05-06 DIAGNOSIS — J449 Chronic obstructive pulmonary disease, unspecified: Secondary | ICD-10-CM | POA: Diagnosis not present

## 2015-05-06 DIAGNOSIS — I739 Peripheral vascular disease, unspecified: Secondary | ICD-10-CM | POA: Diagnosis not present

## 2015-05-06 DIAGNOSIS — I120 Hypertensive chronic kidney disease with stage 5 chronic kidney disease or end stage renal disease: Secondary | ICD-10-CM | POA: Diagnosis not present

## 2015-05-06 DIAGNOSIS — I25111 Atherosclerotic heart disease of native coronary artery with angina pectoris with documented spasm: Secondary | ICD-10-CM | POA: Diagnosis not present

## 2015-05-06 DIAGNOSIS — E119 Type 2 diabetes mellitus without complications: Secondary | ICD-10-CM | POA: Diagnosis not present

## 2015-05-06 DIAGNOSIS — R51 Headache: Secondary | ICD-10-CM | POA: Diagnosis not present

## 2015-05-07 ENCOUNTER — Inpatient Hospital Stay
Admission: EM | Admit: 2015-05-07 | Discharge: 2015-05-08 | DRG: 280 | Disposition: A | Discharge status: Home / Self Care | Payer: Medicare Other | Attending: Internal Medicine | Admitting: Internal Medicine

## 2015-05-07 ENCOUNTER — Emergency Department: Payer: Medicare Other

## 2015-05-07 ENCOUNTER — Encounter: Payer: Self-pay | Admitting: Cardiovascular Disease

## 2015-05-07 DIAGNOSIS — I5033 Acute on chronic diastolic (congestive) heart failure: Secondary | ICD-10-CM | POA: Diagnosis present

## 2015-05-07 DIAGNOSIS — Z79899 Other long term (current) drug therapy: Secondary | ICD-10-CM

## 2015-05-07 DIAGNOSIS — I10 Essential (primary) hypertension: Secondary | ICD-10-CM | POA: Diagnosis not present

## 2015-05-07 DIAGNOSIS — Z8249 Family history of ischemic heart disease and other diseases of the circulatory system: Secondary | ICD-10-CM | POA: Diagnosis not present

## 2015-05-07 DIAGNOSIS — R0602 Shortness of breath: Secondary | ICD-10-CM | POA: Diagnosis not present

## 2015-05-07 DIAGNOSIS — Z8701 Personal history of pneumonia (recurrent): Secondary | ICD-10-CM

## 2015-05-07 DIAGNOSIS — E785 Hyperlipidemia, unspecified: Secondary | ICD-10-CM | POA: Diagnosis present

## 2015-05-07 DIAGNOSIS — Z803 Family history of malignant neoplasm of breast: Secondary | ICD-10-CM

## 2015-05-07 DIAGNOSIS — Z800 Family history of malignant neoplasm of digestive organs: Secondary | ICD-10-CM

## 2015-05-07 DIAGNOSIS — E1122 Type 2 diabetes mellitus with diabetic chronic kidney disease: Secondary | ICD-10-CM | POA: Diagnosis present

## 2015-05-07 DIAGNOSIS — I5032 Chronic diastolic (congestive) heart failure: Secondary | ICD-10-CM | POA: Diagnosis present

## 2015-05-07 DIAGNOSIS — F419 Anxiety disorder, unspecified: Secondary | ICD-10-CM | POA: Diagnosis not present

## 2015-05-07 DIAGNOSIS — E1140 Type 2 diabetes mellitus with diabetic neuropathy, unspecified: Secondary | ICD-10-CM | POA: Diagnosis present

## 2015-05-07 DIAGNOSIS — E1151 Type 2 diabetes mellitus with diabetic peripheral angiopathy without gangrene: Secondary | ICD-10-CM | POA: Diagnosis present

## 2015-05-07 DIAGNOSIS — J449 Chronic obstructive pulmonary disease, unspecified: Secondary | ICD-10-CM | POA: Diagnosis not present

## 2015-05-07 DIAGNOSIS — Z7982 Long term (current) use of aspirin: Secondary | ICD-10-CM | POA: Diagnosis not present

## 2015-05-07 DIAGNOSIS — I059 Rheumatic mitral valve disease, unspecified: Secondary | ICD-10-CM | POA: Diagnosis present

## 2015-05-07 DIAGNOSIS — Z794 Long term (current) use of insulin: Secondary | ICD-10-CM

## 2015-05-07 DIAGNOSIS — D631 Anemia in chronic kidney disease: Secondary | ICD-10-CM | POA: Diagnosis not present

## 2015-05-07 DIAGNOSIS — Z955 Presence of coronary angioplasty implant and graft: Secondary | ICD-10-CM | POA: Diagnosis not present

## 2015-05-07 DIAGNOSIS — Z86010 Personal history of colonic polyps: Secondary | ICD-10-CM

## 2015-05-07 DIAGNOSIS — I25111 Atherosclerotic heart disease of native coronary artery with angina pectoris with documented spasm: Secondary | ICD-10-CM | POA: Diagnosis present

## 2015-05-07 DIAGNOSIS — I252 Old myocardial infarction: Secondary | ICD-10-CM

## 2015-05-07 DIAGNOSIS — I132 Hypertensive heart and chronic kidney disease with heart failure and with stage 5 chronic kidney disease, or end stage renal disease: Principal | ICD-10-CM | POA: Diagnosis present

## 2015-05-07 DIAGNOSIS — J9601 Acute respiratory failure with hypoxia: Secondary | ICD-10-CM | POA: Diagnosis not present

## 2015-05-07 DIAGNOSIS — N186 End stage renal disease: Secondary | ICD-10-CM | POA: Diagnosis not present

## 2015-05-07 DIAGNOSIS — Z992 Dependence on renal dialysis: Secondary | ICD-10-CM | POA: Diagnosis not present

## 2015-05-07 DIAGNOSIS — Z743 Need for continuous supervision: Secondary | ICD-10-CM | POA: Diagnosis not present

## 2015-05-07 DIAGNOSIS — I110 Hypertensive heart disease with heart failure: Secondary | ICD-10-CM | POA: Diagnosis not present

## 2015-05-07 DIAGNOSIS — I509 Heart failure, unspecified: Secondary | ICD-10-CM | POA: Diagnosis not present

## 2015-05-07 DIAGNOSIS — D573 Sickle-cell trait: Secondary | ICD-10-CM | POA: Diagnosis present

## 2015-05-07 DIAGNOSIS — Z87891 Personal history of nicotine dependence: Secondary | ICD-10-CM | POA: Diagnosis not present

## 2015-05-07 DIAGNOSIS — E119 Type 2 diabetes mellitus without complications: Secondary | ICD-10-CM | POA: Diagnosis not present

## 2015-05-07 DIAGNOSIS — I214 Non-ST elevation (NSTEMI) myocardial infarction: Secondary | ICD-10-CM | POA: Diagnosis present

## 2015-05-07 DIAGNOSIS — Z823 Family history of stroke: Secondary | ICD-10-CM

## 2015-05-07 DIAGNOSIS — R51 Headache: Secondary | ICD-10-CM | POA: Diagnosis present

## 2015-05-07 DIAGNOSIS — N2581 Secondary hyperparathyroidism of renal origin: Secondary | ICD-10-CM | POA: Diagnosis not present

## 2015-05-07 DIAGNOSIS — E113599 Type 2 diabetes mellitus with proliferative diabetic retinopathy without macular edema, unspecified eye: Secondary | ICD-10-CM | POA: Diagnosis present

## 2015-05-07 DIAGNOSIS — Z833 Family history of diabetes mellitus: Secondary | ICD-10-CM

## 2015-05-07 DIAGNOSIS — R0600 Dyspnea, unspecified: Secondary | ICD-10-CM | POA: Diagnosis not present

## 2015-05-07 DIAGNOSIS — I5030 Unspecified diastolic (congestive) heart failure: Secondary | ICD-10-CM | POA: Diagnosis not present

## 2015-05-07 LAB — CBC WITH DIFFERENTIAL/PLATELET
BASOS ABS: 0 10*3/uL (ref 0–0.1)
BASOS PCT: 1 %
Eosinophils Absolute: 0.3 10*3/uL (ref 0–0.7)
Eosinophils Relative: 3 %
HEMATOCRIT: 27.8 % — AB (ref 35.0–47.0)
HEMOGLOBIN: 9.1 g/dL — AB (ref 12.0–16.0)
Lymphocytes Relative: 11 %
Lymphs Abs: 0.9 10*3/uL — ABNORMAL LOW (ref 1.0–3.6)
MCH: 34.3 pg — ABNORMAL HIGH (ref 26.0–34.0)
MCHC: 32.7 g/dL (ref 32.0–36.0)
MCV: 104.9 fL — ABNORMAL HIGH (ref 80.0–100.0)
MONOS PCT: 3 %
Monocytes Absolute: 0.3 10*3/uL (ref 0.2–0.9)
NEUTROS ABS: 7 10*3/uL — AB (ref 1.4–6.5)
NEUTROS PCT: 82 %
Platelets: 142 10*3/uL — ABNORMAL LOW (ref 150–440)
RBC: 2.65 MIL/uL — ABNORMAL LOW (ref 3.80–5.20)
RDW: 16.9 % — ABNORMAL HIGH (ref 11.5–14.5)
WBC: 8.6 10*3/uL (ref 3.6–11.0)

## 2015-05-07 LAB — BLOOD GAS, ARTERIAL
ACID-BASE EXCESS: 2.6 mmol/L (ref 0.0–3.0)
Allens test (pass/fail): POSITIVE — AB
Bicarbonate: 27.2 mEq/L (ref 21.0–28.0)
DELIVERY SYSTEMS: POSITIVE
Expiratory PAP: 6
FIO2: 0.4
INSPIRATORY PAP: 12
O2 Saturation: 95.6 %
PATIENT TEMPERATURE: 37
PCO2 ART: 41 mmHg (ref 32.0–48.0)
PH ART: 7.43 (ref 7.350–7.450)
pO2, Arterial: 77 mmHg — ABNORMAL LOW (ref 83.0–108.0)

## 2015-05-07 LAB — BASIC METABOLIC PANEL
ANION GAP: 12 (ref 5–15)
BUN: 76 mg/dL — ABNORMAL HIGH (ref 6–20)
CHLORIDE: 99 mmol/L — AB (ref 101–111)
CO2: 26 mmol/L (ref 22–32)
Calcium: 8.1 mg/dL — ABNORMAL LOW (ref 8.9–10.3)
Creatinine, Ser: 9.94 mg/dL — ABNORMAL HIGH (ref 0.44–1.00)
GFR calc non Af Amer: 4 mL/min — ABNORMAL LOW (ref >60)
GFR, EST AFRICAN AMERICAN: 4 mL/min — AB (ref >60)
Glucose, Bld: 119 mg/dL — ABNORMAL HIGH (ref 65–99)
Potassium: 5.9 mmol/L — ABNORMAL HIGH (ref 3.5–5.1)
Sodium: 137 mmol/L (ref 135–145)

## 2015-05-07 LAB — PHOSPHORUS: Phosphorus: 6.3 mg/dL — ABNORMAL HIGH (ref 2.5–4.6)

## 2015-05-07 LAB — GLUCOSE, CAPILLARY
GLUCOSE-CAPILLARY: 148 mg/dL — AB (ref 65–99)
Glucose-Capillary: 171 mg/dL — ABNORMAL HIGH (ref 65–99)

## 2015-05-07 LAB — TROPONIN I: Troponin I: <0.03 ng/mL (ref <0.031)

## 2015-05-07 MED ORDER — INSULIN DETEMIR 100 UNIT/ML ~~LOC~~ SOLN
5.0000 [IU] | Freq: Every day | SUBCUTANEOUS | Status: DC
  Administered 2015-05-07 – 2015-05-08 (×2): 5 [IU] via SUBCUTANEOUS
  Filled 2015-05-07 (×2): qty 0.05

## 2015-05-07 MED ORDER — DIPHENHYDRAMINE HCL 25 MG PO CAPS
25.0000 mg | ORAL_CAPSULE | Freq: Three times a day (TID) | ORAL | Status: DC | PRN
  Administered 2015-05-07 – 2015-05-08 (×2): 25 mg via ORAL
  Filled 2015-05-07 (×2): qty 1

## 2015-05-07 MED ORDER — ONDANSETRON HCL 4 MG PO TABS: 4.0000 mg | ORAL_TABLET | Freq: Four times a day (QID) | ORAL | Status: DC | PRN

## 2015-05-07 MED ORDER — HYDRALAZINE HCL 25 MG PO TABS
25.0000 mg | ORAL_TABLET | Freq: Three times a day (TID) | ORAL | Status: DC
  Administered 2015-05-07 – 2015-05-08 (×3): 25 mg via ORAL
  Filled 2015-05-07 (×3): qty 1

## 2015-05-07 MED ORDER — INSULIN ASPART 100 UNIT/ML ~~LOC~~ SOLN: 0.0000 [IU] | Freq: Every day | SUBCUTANEOUS | Status: DC

## 2015-05-07 MED ORDER — LEVOFLOXACIN 250 MG PO TABS
250.0000 mg | ORAL_TABLET | ORAL | Status: DC
  Administered 2015-05-07: 250 mg via ORAL
  Filled 2015-05-07: qty 1

## 2015-05-07 MED ORDER — CALCIUM ACETATE (PHOS BINDER) 667 MG PO CAPS: 1334.0000 mg | ORAL_CAPSULE | Freq: Two times a day (BID) | ORAL | Status: DC

## 2015-05-07 MED ORDER — ACETAMINOPHEN 325 MG PO TABS: 650.0000 mg | ORAL_TABLET | Freq: Four times a day (QID) | ORAL | Status: DC | PRN

## 2015-05-07 MED ORDER — SENNOSIDES-DOCUSATE SODIUM 8.6-50 MG PO TABS: 1.0000 | ORAL_TABLET | Freq: Every evening | ORAL | Status: DC | PRN

## 2015-05-07 MED ORDER — ALBUTEROL SULFATE (2.5 MG/3ML) 0.083% IN NEBU
INHALATION_SOLUTION | RESPIRATORY_TRACT | Status: AC
  Administered 2015-05-07: 2.5 mg via RESPIRATORY_TRACT
  Filled 2015-05-07: qty 3

## 2015-05-07 MED ORDER — PENTAFLUOROPROP-TETRAFLUOROETH EX AERO
1.0000 "application " | INHALATION_SPRAY | CUTANEOUS | Status: DC | PRN
  Filled 2015-05-07: qty 30

## 2015-05-07 MED ORDER — ALBUTEROL SULFATE (2.5 MG/3ML) 0.083% IN NEBU
2.5000 mg | INHALATION_SOLUTION | Freq: Once | RESPIRATORY_TRACT | Status: AC
  Administered 2015-05-07: 2.5 mg via RESPIRATORY_TRACT

## 2015-05-07 MED ORDER — LIDOCAINE-PRILOCAINE 2.5-2.5 % EX CREA
1.0000 | TOPICAL_CREAM | CUTANEOUS | Status: DC | PRN
Start: 2015-05-07 — End: 2015-05-08

## 2015-05-07 MED ORDER — ROSUVASTATIN CALCIUM 20 MG PO TABS
40.0000 mg | ORAL_TABLET | Freq: Every day | ORAL | Status: DC
  Administered 2015-05-07: 40 mg via ORAL
  Filled 2015-05-07 (×2): qty 2

## 2015-05-07 MED ORDER — ALUM & MAG HYDROXIDE-SIMETH 200-200-20 MG/5ML PO SUSP: 30.0000 mL | Freq: Four times a day (QID) | ORAL | Status: DC | PRN

## 2015-05-07 MED ORDER — HEPARIN SODIUM (PORCINE) 1000 UNIT/ML DIALYSIS
1000.0000 [IU] | INTRAMUSCULAR | Status: DC | PRN
  Filled 2015-05-07: qty 1

## 2015-05-07 MED ORDER — ALBUTEROL SULFATE (2.5 MG/3ML) 0.083% IN NEBU: 2.5000 mg | INHALATION_SOLUTION | Freq: Four times a day (QID) | RESPIRATORY_TRACT | Status: DC | PRN

## 2015-05-07 MED ORDER — SODIUM CHLORIDE 0.9 % IV SOLN: 100.0000 mL | INTRAVENOUS | Status: DC | PRN

## 2015-05-07 MED ORDER — BENZONATATE 100 MG PO CAPS: 200.0000 mg | ORAL_CAPSULE | Freq: Three times a day (TID) | ORAL | Status: DC | PRN

## 2015-05-07 MED ORDER — MORPHINE SULFATE (PF) 2 MG/ML IV SOLN: 1.0000 mg | INTRAVENOUS | Status: DC | PRN

## 2015-05-07 MED ORDER — LIDOCAINE HCL (PF) 1 % IJ SOLN
5.0000 mL | INTRAMUSCULAR | Status: DC | PRN
  Filled 2015-05-07: qty 5

## 2015-05-07 MED ORDER — ALTEPLASE 2 MG IJ SOLR
2.0000 mg | Freq: Once | INTRAMUSCULAR | Status: DC | PRN
Start: 2015-05-07 — End: 2015-05-08

## 2015-05-07 MED ORDER — SODIUM CHLORIDE 0.9 % IJ SOLN
3.0000 mL | Freq: Two times a day (BID) | INTRAMUSCULAR | Status: DC
  Administered 2015-05-07: 3 mL via INTRAVENOUS

## 2015-05-07 MED ORDER — CALCIUM ACETATE (PHOS BINDER) 667 MG PO CAPS
2668.0000 mg | ORAL_CAPSULE | Freq: Three times a day (TID) | ORAL | Status: DC
  Administered 2015-05-07 – 2015-05-08 (×2): 2668 mg via ORAL
  Filled 2015-05-07 (×2): qty 4

## 2015-05-07 MED ORDER — PREGABALIN 75 MG PO CAPS
75.0000 mg | ORAL_CAPSULE | Freq: Every day | ORAL | Status: DC
  Administered 2015-05-07: 75 mg via ORAL
  Filled 2015-05-07 (×2): qty 1

## 2015-05-07 MED ORDER — INSULIN DETEMIR 100 UNIT/ML FLEXPEN: 5.0000 [IU] | PEN_INJECTOR | Freq: Every day | SUBCUTANEOUS | Status: DC

## 2015-05-07 MED ORDER — HYDROCODONE-ACETAMINOPHEN 5-325 MG PO TABS
1.0000 | ORAL_TABLET | ORAL | Status: DC | PRN
  Administered 2015-05-07 – 2015-05-08 (×2): 2 via ORAL
  Filled 2015-05-07 (×2): qty 2

## 2015-05-07 MED ORDER — NITROGLYCERIN IN D5W 200-5 MCG/ML-% IV SOLN
0.0000 ug/min | Freq: Once | INTRAVENOUS | Status: AC
Start: 2015-05-07 — End: 2015-05-07
  Administered 2015-05-07: 50 ug/min via INTRAVENOUS

## 2015-05-07 MED ORDER — CLONIDINE HCL 0.1 MG PO TABS
0.1000 mg | ORAL_TABLET | Freq: Two times a day (BID) | ORAL | Status: DC
  Administered 2015-05-07 – 2015-05-08 (×2): 0.1 mg via ORAL
  Filled 2015-05-07 (×2): qty 1

## 2015-05-07 MED ORDER — ONDANSETRON HCL 4 MG/2ML IJ SOLN: 4.0000 mg | Freq: Four times a day (QID) | INTRAMUSCULAR | Status: DC | PRN

## 2015-05-07 MED ORDER — SODIUM CHLORIDE 0.9 % IV SOLN
100.0000 mL | INTRAVENOUS | Status: DC | PRN
Start: 2015-05-07 — End: 2015-05-08

## 2015-05-07 MED ORDER — AMLODIPINE BESYLATE 10 MG PO TABS
10.0000 mg | ORAL_TABLET | Freq: Every day | ORAL | Status: DC
  Administered 2015-05-07 – 2015-05-08 (×2): 10 mg via ORAL
  Filled 2015-05-07 (×2): qty 1

## 2015-05-07 MED ORDER — MORPHINE SULFATE (PF) 2 MG/ML IV SOLN
1.0000 mg | Freq: Once | INTRAVENOUS | Status: AC
  Administered 2015-05-07: 1 mg via INTRAVENOUS
  Filled 2015-05-07: qty 1

## 2015-05-07 MED ORDER — CARVEDILOL 12.5 MG PO TABS
12.5000 mg | ORAL_TABLET | Freq: Two times a day (BID) | ORAL | Status: DC
  Administered 2015-05-07: 12.5 mg via ORAL
  Filled 2015-05-07: qty 1

## 2015-05-07 MED ORDER — HYDRALAZINE HCL 20 MG/ML IJ SOLN
10.0000 mg | Freq: Once | INTRAMUSCULAR | Status: AC
  Administered 2015-05-07: 10 mg via INTRAVENOUS
  Filled 2015-05-07: qty 1

## 2015-05-07 MED ORDER — ACETAMINOPHEN 650 MG RE SUPP: 650.0000 mg | Freq: Four times a day (QID) | RECTAL | Status: DC | PRN

## 2015-05-07 MED ORDER — NITROGLYCERIN 0.4 MG SL SUBL: 0.4000 mg | SUBLINGUAL_TABLET | SUBLINGUAL | Status: DC | PRN

## 2015-05-07 MED ORDER — HEPARIN SODIUM (PORCINE) 5000 UNIT/ML IJ SOLN
5000.0000 [IU] | Freq: Three times a day (TID) | INTRAMUSCULAR | Status: DC
  Administered 2015-05-08: 5000 [IU] via SUBCUTANEOUS
  Filled 2015-05-07: qty 1

## 2015-05-07 MED ORDER — ALBUTEROL SULFATE HFA 108 (90 BASE) MCG/ACT IN AERS: 2.0000 | INHALATION_SPRAY | Freq: Four times a day (QID) | RESPIRATORY_TRACT | Status: DC | PRN

## 2015-05-07 MED ORDER — INSULIN ASPART 100 UNIT/ML ~~LOC~~ SOLN
0.0000 [IU] | Freq: Three times a day (TID) | SUBCUTANEOUS | Status: DC
  Administered 2015-05-07: 2 [IU] via SUBCUTANEOUS
  Administered 2015-05-08: 1 [IU] via SUBCUTANEOUS
  Filled 2015-05-07: qty 1
  Filled 2015-05-07: qty 2

## 2015-05-07 MED ORDER — ASPIRIN EC 81 MG PO TBEC
81.0000 mg | DELAYED_RELEASE_TABLET | Freq: Every day | ORAL | Status: DC
  Administered 2015-05-07 – 2015-05-08 (×2): 81 mg via ORAL
  Filled 2015-05-07 (×2): qty 1

## 2015-05-07 NOTE — ED Notes (Signed)
Report called to [place], Therapist, sports on 2A. This RN also called Tonya in Dialysis to tell her bed number [id number].

## 2015-05-07 NOTE — ED Notes (Signed)
Pt taken to dialysis via EDT. This RN to call dialysis with bed number when bed available.

## 2015-05-07 NOTE — Progress Notes (Signed)
HD tx start 

## 2015-05-07 NOTE — ED Notes (Signed)
BiPap Removed pt placed on 2L O2 at this time. Pt tolerating well at this time. Will continue to monitor at this time.

## 2015-05-07 NOTE — Progress Notes (Signed)
Pt complaining of pain, norco given.  Pt then reports that norco can make her itch.  Dr. Jannifer Franklin notified.  MD to place order for benadryl.  Will continue to monitor. Jessee Avers

## 2015-05-07 NOTE — Progress Notes (Signed)
Post hd tx 

## 2015-05-07 NOTE — Progress Notes (Signed)
Pre-tx note

## 2015-05-07 NOTE — H&P (Signed)
[facility] NAME: Theresa [name]    MR#:  [id number]  DATE OF BIRTH:  [birth year]  DATE OF ADMISSION:  05/07/2015  PRIMARY CARE PHYSICIAN: Rica Mast, MD   REQUESTING/REFERRING PHYSICIAN: Dr Dineen Kid  CHIEF COMPLAINT:  Shortness of breath HISTORY OF PRESENT ILLNESS:  Theresa [name]  is a 59 y.o. female with a known history of end-stage renal disease on hemodialysis Monday, Wednesday and Friday and essential hypertension he was recently admitted to the hospital for chest pain and shortness of breath. She underwent cardiac catheterization which showed no significant stenosis. She also underwent a VQ scan which showed low likelihood of pulmonary emboli. She was discharged on Friday with a diagnoses of bronchitis. Since that time she has been feeling well up until this morning at approximate 4 AM when she will cut feeling short of breath. Patient was brought to the ER for further evaluation due to her acute shortness of breath. In the emergency room she was placed on BiPAP machine and it was noted that her blood pressure was very elevated systolic greater than 975O. Nephrology was consulted by the ER physician who recommended nitroglycerin drip. Since that time patient's blood pressure has improved and her respiratory rate is improved as well. We are titrating her off the BiPAP machine and nitroglycerin drip at this time. She will need to undergo hemodialysis as her chest x-ray is consistent with pulmonary edema.  PAST MEDICAL HISTORY:   Past Medical History  Diagnosis Date  . Hypertension   . ESRD on hemodialysis ([place])     a. [facility] in [place], [place], on a TTS schedule.  She started dialysis in Feb 2014.  Etiology of renal failure not known, likely diabetes.  Has a left upper arm AV graft.  Marland Kitchen COPD (chronic obstructive pulmonary disease) ([place])   . Emphysema   . History of pneumonia     June 2012  . History of bronchitis     Mar  2012  . Peripheral vascular disease ([place])   . Chronic constipation   . Colon polyps   . Sickle cell trait ([place])   . Anemia of chronic disease   . Diabetes mellitus   . Moderate mitral insufficiency     a. 10/2013 Echo: EF 55-60%, mod MR.  . Diabetic neuropathy ([place])   . Diabetic retinopathy ([place]) 05/28/2013    Hx bilat retinal detachment, proliferative diab retinopathy and bilat vitreous hemorrhage   . Hyperlipidemia   . Coronary artery disease     a. 05/2013 NSTEMI/PCI: LM 20d, LAD min irregs, LCX small, nl, OM1 nl, RCA dom 68m(2.5x16 Promus DES), PDA1 80p.  . Chronic diastolic CHF (congestive heart failure) ([place]     a. 10/2013 Echo ([facility]: EF 55-60%, mod conc LVH, mod MR, mildly dil LA, mild Ao sclerosis w/o stenosis.  . Carotid arterial disease ([place]     a. 02/2013 U/S: 40-59% bilat ICA stenosis - *f/u 02/2014*  . History of tobacco abuse     a. Quit 2012.    PAST SURGICAL HISTORY:   Past Surgical History  Procedure Laterality Date  . Abdominal hysterectomy      2000  . Dilation and curettage of uterus      several in the early 80's  . Eye surgery      bilateral laser 2012  . Esophagogastroduodenoscopy      2012  . Colonscopy    . Tubal ligation  1979  . Pars plana vitrectomy  04/22/2011    Procedure: PARS PLANA VITRECTOMY WITH 25 GAUGE;  Surgeon: [name], MD;  Location: [facility];  Service: Ophthalmology;  Laterality: Left;  membrane peel, endolaser, gas fluid exchange, silicone oil, repair of complex traction retinal detachment  . Pars plana vitrectomy  09/30/2011    Procedure: PARS PLANA VITRECTOMY WITH 25 GAUGE;  Surgeon: [name], MD;  Location: [facility];  Service: Ophthalmology;  Laterality: Right;  Endolaser; Repair of Complex Traction Retinal Detachment  . Gas/fluid exchange  09/30/2011    Procedure: GAS/FLUID EXCHANGE;  Surgeon: [name], MD;  Location: [facility];  Service: Ophthalmology;  Laterality: Right;  . Gas insertion  09/30/2011    Procedure:  INSERTION OF GAS;  Surgeon: [name], MD;  Location: [facility];  Service: Ophthalmology;  Laterality: Right;  C3F8  . Eye surgery      right  . Pars plana vitrectomy  02/24/2012    Procedure: PARS PLANA VITRECTOMY WITH 25 GAUGE;  Surgeon: [name], MD;  Location: [facility];  Service: Ophthalmology;  Laterality: Left;  . Silicon oil removal  02/21/7828    Procedure: SILICON OIL REMOVAL;  Surgeon: [name], MD;  Location: [facility];  Service: Ophthalmology;  Laterality: Left;  . Ptca    . Thrombectomy / arteriovenous graft revision    . Cardiac catheterization    . Coronary angioplasty  05/28/2014    stent placement to the mid RCA  . Left heart catheterization with coronary angiogram N/A 05/28/2013    Procedure: LEFT HEART CATHETERIZATION WITH CORONARY ANGIOGRAM;  Surgeon: [name], MD;  Location: [facility] CATH LAB;  Service: Cardiovascular;  Laterality: N/A;  . Cardiac catheterization N/A 05/02/2015    Procedure: Left Heart Cath and Coronary Angiography;  Surgeon: [name], MD;  Location: [facility] CV LAB;  Service: Cardiovascular;  Laterality: N/A;    SOCIAL HISTORY:   Social History  Substance Use Topics  . Smoking status: Former Smoker -- 1.00 packs/day for 25 years    Quit date: 06/09/2010  . Smokeless tobacco: Never Used  . Alcohol Use: No    FAMILY HISTORY:   Family History  Problem Relation Age of Onset  . Anesthesia problems Neg Hx   . Hypotension Neg Hx   . Malignant hyperthermia Neg Hx   . Pseudochol deficiency Neg Hx   . Stroke Mother   . Heart attack Mother   . Heart disease Mother   . Colon cancer Father   . Colon cancer Sister   . Heart attack Brother   . Stroke Brother   . Diabetes Brother   . Breast cancer Sister   . Diabetes Brother   . Diabetes Daughter     DRUG ALLERGIES:   Allergies  Allergen Reactions  . Metformin Diarrhea  . Hydrocodone Hives     REVIEW OF SYSTEMS:  CONSTITUTIONAL: No fever, fatigue or weakness.   EYES: No blurred or double vision.  EARS, NOSE, AND THROAT: No tinnitus or ear pain.  +headache from NTG RESPIRATORY: ++cough, shortness of breath, NO wheezing or hemoptysis.  CARDIOVASCULAR: No chest pain, orthopnea, edema.  GASTROINTESTINAL: No nausea, vomiting, diarrhea or abdominal pain.  GENITOURINARY: No dysuria, hematuria.  ENDOCRINE: No polyuria, nocturia,  HEMATOLOGY: No anemia, easy bruising or bleeding SKIN: No rash or lesion. MUSCULOSKELETAL: No joint pain or arthritis.   NEUROLOGIC: No tingling, numbness, weakness.  PSYCHIATRY: No anxiety or depression.   MEDICATIONS AT  HOME:   Prior to Admission medications   Medication Sig Start Date End Date Taking? Authorizing Provider  albuterol (PROVENTIL HFA;VENTOLIN HFA) 108 (90 BASE) MCG/ACT inhaler Inhale 2 puffs into the lungs every 6 (six) hours as needed for wheezing or shortness of breath.    Historical Provider, MD  amLODipine (NORVASC) 10 MG tablet Take 1 tablet (10 mg total) by mouth daily. 05/04/15   Bettey Costa, MD  aspirin EC 81 MG tablet Take 81 mg by mouth daily.    Historical Provider, MD  BAYER CONTOUR TEST test strip TEST BLOOD SUGARS 2 TIMES DAILY 09/26/13   Jackolyn Confer, MD  benzonatate (TESSALON) 200 MG capsule Take 1 capsule (200 mg total) by mouth 3 (three) times daily as needed for cough. 05/04/15   Max Sane, MD  Blood Glucose Monitoring Suppl (ONE TOUCH ULTRA SYSTEM KIT) W/DEVICE KIT Please dispense for patient to use at home to check blood sugars. Patient taking differently: 1 kit by Other route See admin instructions. Pt uses to test blood sugars. 07/27/13   Jackolyn Confer, MD  calcium acetate (PHOSLO) 667 MG capsule Take [phone] mg by mouth 5 (five) times daily. Pt takes four capsules with meals and two capsules with snacks.    Historical Provider, MD  calcium acetate (PHOSLO) 667 MG capsule Take 2 capsules (1,334 mg total) by mouth 2 (two) times daily between meals. 05/03/15   Bettey Costa, MD   carvedilol (COREG) 12.5 MG tablet Take 1 tablet (12.5 mg total) by mouth 2 (two) times daily. 06/03/13   Rhonda G Barrett, PA-C  cloNIDine (CATAPRES) 0.1 MG tablet Take 1 tablet (0.1 mg total) by mouth 2 (two) times daily. 01/30/15   Minna Merritts, MD  hydrALAZINE (APRESOLINE) 25 MG tablet Take 1 tablet (25 mg total) by mouth every 8 (eight) hours. 05/04/15   Max Sane, MD  Insulin Detemir (LEVEMIR) 100 UNIT/ML Pen Inject 5 Units into the skin daily. 03/28/14   Jackolyn Confer, MD  Lancets Thin MISC Using at home to test blood sugars twice daily. Patient taking differently: 1 each by Other route 2 (two) times daily. Pt uses to test blood sugars. 07/25/13   Jackolyn Confer, MD  levofloxacin (LEVAQUIN) 250 MG tablet Take 1 tablet (250 mg total) by mouth every other day. 05/05/15   Max Sane, MD  mometasone (NASONEX) 50 MCG/ACT nasal spray Place 2 sprays into the nose 2 (two) times daily as needed (for allergies).    Historical Provider, MD  nitroGLYCERIN (NITROSTAT) 0.4 MG SL tablet Place 1 tablet (0.4 mg total) under the tongue every 5 (five) minutes as needed for chest pain. 06/03/13   Evelene Croon Barrett, PA-C  NOVOFINE 32G X 6 MM MISC USE AS DIRECTED 01/08/15   Jackolyn Confer, MD  pregabalin (LYRICA) 75 MG capsule Take 1 capsule (75 mg total) by mouth daily. 03/28/14   Jackolyn Confer, MD  rosuvastatin (CRESTOR) 40 MG tablet Take 1 tablet (40 mg total) by mouth daily. 02/02/14   Minna Merritts, MD      VITAL SIGNS:  Blood pressure 155/69, pulse 71, resp. rate 24, height _0  (1.702 m), weight 76.658 kg (169 lb), SpO2 100 %.  PHYSICAL EXAMINATION:  GENERAL:  59 y.o.-year-old patient sitting up in the bed using BiPAP machine using accessory muscles. Hard for her to complete full sentences.  EYES: Pupils equal, round, reactive to light and accommodation. No scleral icterus. Extraocular muscles intact.  HEENT: Head atraumatic, normocephalic.  Oropharynx and nasopharynx clear.  NECK:   Supple, no jugular venous distention. No thyroid enlargement, no tenderness.  LUNGS: Tachypnea, with increased rate of breathing and bilateral crackles at the bases. CARDIOVASCULAR: S1, S2 normal. No murmurs, rubs, or gallops.  ABDOMEN: Soft, nontender, nondistended. Bowel sounds present. No organomegaly or mass.  EXTREMITIES: No pedal edema, cyanosis, or clubbing.  NEUROLOGIC: Cranial nerves II through XII are grossly intact. No focal deficits. PSYCHIATRIC: The patient is alert and oriented x 3.  SKIN: No obvious rash, lesion, or ulcer.   LABORATORY PANEL:   CBC  Recent Labs Lab 05/07/15 0927  WBC 8.6  HGB 9.1*  HCT 27.8*  PLT 142*   ------------------------------------------------------------------------------------------------------------------  Chemistries   Recent Labs Lab 05/07/15 0927  NA 137  K 5.9*  CL 99*  CO2 26  GLUCOSE 119*  BUN 76*  CREATININE 9.94*  CALCIUM 8.1*   ------------------------------------------------------------------------------------------------------------------  Cardiac Enzymes  Recent Labs Lab 05/02/15 1153 05/07/15 0927  TROPONINI 0.05* <0.03   ------------------------------------------------------------------------------------------------------------------  RADIOLOGY:  Chest x-ray shows pulmonary edema   EKG:   normal sinus rhythm no ST elevation or depression positive LVH   IMPRESSION AND PLAN:    59 year old female with history of end-stage renal disease on hemodialysis and essential hypertension who presents with shortness of breath and found to have acute on chronic diastolic heart failure.     1. Acute hypoxic respiratory failure: This is secondary to acute on chronic diastolic heart failure with pulmonary edema from end-stage renal disease.. She underwent echocardiogram in June 2016 which showed a normal ejection fraction. She is currently on a BiPAP and we are titrating this to nasal cannula as tolerated. She will  need to undergo hemodialysis.  2. Acute on chronic diastolic heart failure: This is secondary to end-stage renal disease. Patient will need to undergo hemodialysis area nephrology has been consulted by the ER physician and plan is for hemodialysis today.  3. Malignant hypertension: This is also likely causing her pulmonary edema. She is currently on nitroglycerin drip which we are trying to titrate off. We will restart her outpatient medications and follow her blood pressure closely.  4. Type 2 diabetes: Continue Levemir and sliding scale insulin with ADA diet.  5. Hyperlipidemia: Continue statin       All the records are reviewed and case discussed with ED provider. Management plans discussed with the patient and she is in agreement.  CODE STATUS: FULL  CRITICAL CARE TOTAL TIME TAKING CARE OF THIS PATIENT: 55 minutes.   patient at high risk for pulmonary arrest.    Rechel Delosreyes M.D on 05/07/2015 at 10:20 AM  Between 7am to 6pm - Pager - [phone] After 6pm go to [email or web address] - password [email or web address] [facility]  Office  [phone]  CC: Primary care physician; Rica Mast, MD

## 2015-05-07 NOTE — ED Notes (Signed)
Per Dr. Benjie Karvonen, stop Nitro drip, and place on 4L O2 via [place] canula, D/C Bipap.

## 2015-05-07 NOTE — Progress Notes (Signed)
[facility]  ROUNDING NOTE   Subjective:  Pt readmitted with shortness of breath and malignant hypertension. States she didn't eat excessively salty food. While seen in the ED she was on bipap. Had just been weaned off nitro gtt.    Objective:  Vital signs in last 24 hours:  Pulse Rate:  [70-81] 81 (11/28 1415) Resp:  [13-35] 26 (11/28 1415) BP: (152-182)/(61-96) 179/66 mmHg (11/28 1415) SpO2:  [91 %-100 %] 100 % (11/28 1415) Weight:  [76.658 kg (169 lb)] 76.658 kg (169 lb) (11/28 0923)  Weight change:  Filed Weights   05/07/15 0923  Weight: 76.658 kg (169 lb)    Intake/Output:     Intake/Output this shift:     Physical Exam: General: Mild respiratory distress  Head: Normocephalic, atraumatic. Moist oral mucosal membranes  Eyes: Anicteric  Neck: Supple, trachea midline  Lungs:  Basilar rales  Heart: S1S2 no rubs  Abdomen:  Soft, nontender, BS present   Extremities: 1+ peripheral edema.  Neurologic: Nonfocal, moving all four extremities  Skin: No lesions  Access: LUE AVF    Basic Metabolic Panel:  Recent Labs Lab 05/02/15 1153 05/03/15 0816 05/04/15 0522 05/04/15 1234 05/07/15 0927  NA 141 140 137  --  137  K 4.4 4.7 5.6*  --  5.9*  CL 100* 98* 98*  --  99*  CO2 32 32 28  --  26  GLUCOSE 116* 103* 106*  --  119*  BUN 35* 34* 55*  --  76*  CREATININE 6.80* 5.80* 7.75*  --  9.94*  CALCIUM 8.4* 8.3* 8.7*  --  8.1*  PHOS  --   --   --  4.8*  --     Liver Function Tests: No results for input(s): AST, ALT, ALKPHOS, BILITOT, PROT, ALBUMIN in the last 168 hours. No results for input(s): LIPASE, AMYLASE in the last 168 hours. No results for input(s): AMMONIA in the last 168 hours.  CBC:  Recent Labs Lab 05/02/15 1153 05/03/15 0816 05/04/15 0523 05/07/15 0927  WBC 9.1 7.6 8.7 8.6  NEUTROABS  --   --   --  7.0*  HGB 9.4* 8.9* 9.0* 9.1*  HCT 28.1* 27.2* 27.4* 27.8*  MCV 104.6* 105.8* 105.8* 104.9*  PLT 153 137* 134* 142*    Cardiac  Enzymes:  Recent Labs Lab 05/02/15 1153 05/07/15 0927  TROPONINI 0.05* <0.03    BNP: Invalid input(s): POCBNP  CBG:  Recent Labs Lab 05/03/15 1611 05/03/15 2107 05/04/15 0731 05/04/15 1204 05/07/15 1357  GLUCAP 106* 166* 122* 214* 171*    Microbiology: Results for orders placed or performed in visit on 02/13/14  Culture, blood (single)     Status: None   Collection Time: 02/13/14  3:00 AM  Result Value Ref Range Status   Micro Text Report   Final       COMMENT                   NO GROWTH AEROBICALLY/ANAEROBICALLY IN 5 DAYS   ANTIBIOTIC                                                      Culture, blood (single)     Status: None   Collection Time: 02/13/14  3:15 AM  Result Value Ref Range Status   Micro Text Report  Final       COMMENT                   NO GROWTH AEROBICALLY/ANAEROBICALLY IN 5 DAYS   ANTIBIOTIC                                                        Coagulation Studies: No results for input(s): LABPROT, INR in the last 72 hours.  Urinalysis: No results for input(s): COLORURINE, LABSPEC, PHURINE, GLUCOSEU, HGBUR, BILIRUBINUR, KETONESUR, PROTEINUR, UROBILINOGEN, NITRITE, LEUKOCYTESUR in the last 72 hours.  Invalid input(s): APPERANCEUR    Imaging: Dg Chest 1 View  05/07/2015  CLINICAL DATA:  Shortness of breath EXAM: CHEST 1 VIEW COMPARISON:  3 days ago FINDINGS: Borderline cardiopericardial enlargement. Stable aortic and hilar contours when accounting for rightward rotation. There is hazy appearance of the bilateral chest, likely combination of pulmonary edema and layering pleural effusions. No pneumothorax. No asymmetric airspace disease. IMPRESSION: CHF pattern. Electronically Signed   By: Monte Fantasia M.D.   On: 05/07/2015 09:51     Medications:     . insulin aspart  0-5 Units Subcutaneous QHS  . insulin aspart  0-9 Units Subcutaneous TID WC   HYDROcodone-acetaminophen  Assessment/ Plan:  59 y.o. female with past medical  history of hypertension, end-stage renal disease on hemodialysis, coronary artery disease status post cardiac catheterization 05/02/2015, COPD, peripheral vascular disease, sickle cell trait, anemia of chronic kidney disease, diabetes mellitus, hyperlipidemia, left upper extremity AV fistula, carotid artery stenosis, history of tobacco abuse  1. End-stage renal disease on hemodialysis. Patient significantly short of breath, this has improved with nitro gtt and bipap, will plan for dialysis today.  UF target is 3kg.   2. Hypertension. Blood pressure very high upon admission, we added hydralazine last admission, ok to continue PRN hydralazine for now. Volume removal with HD should help.  3. Anemia of chronic kidney disease. Hold epogen for now.    4. Secondary hyperparathyroidism.  Monitor PTH and phos during admission.  5.  Acute Respiratory failure:  Was on bipap this AM, due to volume overload from malignant HTN.  Treated with bipap/nitro, will also treat with dialysis and volume removal.   LOS: 0 Eugenie [place] 11/28/20162:54 PM

## 2015-05-07 NOTE — Progress Notes (Signed)
HD tx completed.

## 2015-05-07 NOTE — ED Provider Notes (Addendum)
[facility] Emergency Department Provider Note  ____________________________________________  Time seen: Seen upon arrival to the emergency department  I have reviewed the triage vital signs and the nursing notes.   HISTORY  Chief Complaint Respiratory Distress    HPI Theresa [name] is a 59 y.o. female with a history of end-stage renal disease on dialysis was presenting today with shortness of breath.She said that her shortness of breath started acutely at 4 AM this morning. EMS was called after several hours of increasing shortness of breath and the patient was started on CPAP. She was also given 125 mg of Solu-Medrol en route. She said that she is compliant with dialysis and was last dialyzed this past Friday. She was recently in the hospital and discharged this past Friday with a diagnosis of bronchitis. She denies any pain at this time.   Past Medical History  Diagnosis Date  . Hypertension   . ESRD on hemodialysis ([place])     a. [facility] in [place], [place], on a TTS schedule.  She started dialysis in Feb 2014.  Etiology of renal failure not known, likely diabetes.  Has a left upper arm AV graft.  Marland Kitchen COPD (chronic obstructive pulmonary disease) ([place])   . Emphysema   . History of pneumonia     June 2012  . History of bronchitis     Mar 2012  . Peripheral vascular disease ([place])   . Chronic constipation   . Colon polyps   . Sickle cell trait ([place])   . Anemia of chronic disease   . Diabetes mellitus   . Moderate mitral insufficiency     a. 10/2013 Echo: EF 55-60%, mod MR.  . Diabetic neuropathy ([place])   . Diabetic retinopathy ([place]) 05/28/2013    Hx bilat retinal detachment, proliferative diab retinopathy and bilat vitreous hemorrhage   . Hyperlipidemia   . Coronary artery disease     a. 05/2013 NSTEMI/PCI: LM 20d, LAD min irregs, LCX small, nl, OM1 nl, RCA dom 57m(2.5x16 Promus DES), PDA1 80p.  . Chronic diastolic CHF (congestive heart failure) ([place]      a. 10/2013 Echo ([facility]: EF 55-60%, mod conc LVH, mod MR, mildly dil LA, mild Ao sclerosis w/o stenosis.  . Carotid arterial disease ([place]     a. 02/2013 U/S: 40-59% bilat ICA stenosis - *f/u 02/2014*  . History of tobacco abuse     a. Quit 2012.    Patient Active Problem List   Diagnosis Date Noted  . Dyspnea on exertion   . Chest rales   . Cephalalgia   . Coronary artery disease involving native coronary artery of native heart with angina pectoris with documented spasm ([place]   . S/P coronary artery stent placement   . Hypertension secondary to other renal disorders   . ESRD (end stage renal disease) ([place] 05/02/2015  . Unstable angina ([place]   . Positive cardiac stress test 05/01/2015  . Right ankle pain 03/05/2015  . Neuritis or radiculitis due to rupture of lumbar intervertebral disc 11/28/2014  . Acute CVA (cerebrovascular accident) ([place] 11/23/2014  . Right hip pain 06/13/2014  . Other malaise and fatigue 02/21/2014  . Tachycardia 01/26/2014  . Routine general medical examination at a health care facility 09/02/2013  . Osteoarthritis of right hip 08/23/2013  . DDD (degenerative disc disease), lumbosacral 08/23/2013  . Coronary artery disease 06/27/2013  . NSTEMI (non-ST elevated myocardial infarction) ([place] 05/30/2013  . Diabetic retinopathy ([place] 05/28/2013  . Hyperlipidemia   .  End stage kidney disease ([place]) 11/08/2012  . Essential (primary) hypertension 11/08/2012  . Diabetic neuropathy ([place]) 02/18/2012  . Anemia in chronic kidney disease (CKD) 02/18/2012  . ESRD on hemodialysis ([place]) 08/21/2011  . Diabetes mellitus, type II ([place]) 06/30/2011  . COPD (chronic obstructive pulmonary disease) ([place]) 06/13/2011    Past Surgical History  Procedure Laterality Date  . Abdominal hysterectomy      2000  . Dilation and curettage of uterus      several in the early 80's  . Eye surgery      bilateral laser 2012  . Esophagogastroduodenoscopy      2012  . Colonscopy    . Tubal  ligation      1979  . Pars plana vitrectomy  04/22/2011    Procedure: PARS PLANA VITRECTOMY WITH 25 GAUGE;  Surgeon: [name], MD;  Location: [facility];  Service: Ophthalmology;  Laterality: Left;  membrane peel, endolaser, gas fluid exchange, silicone oil, repair of complex traction retinal detachment  . Pars plana vitrectomy  09/30/2011    Procedure: PARS PLANA VITRECTOMY WITH 25 GAUGE;  Surgeon: [name], MD;  Location: [facility];  Service: Ophthalmology;  Laterality: Right;  Endolaser; Repair of Complex Traction Retinal Detachment  . Gas/fluid exchange  09/30/2011    Procedure: GAS/FLUID EXCHANGE;  Surgeon: [name], MD;  Location: [facility];  Service: Ophthalmology;  Laterality: Right;  . Gas insertion  09/30/2011    Procedure: INSERTION OF GAS;  Surgeon: [name], MD;  Location: [facility];  Service: Ophthalmology;  Laterality: Right;  [id number]  . Eye surgery      right  . Pars plana vitrectomy  02/24/2012    Procedure: PARS PLANA VITRECTOMY WITH 25 GAUGE;  Surgeon: [name], MD;  Location: [facility];  Service: Ophthalmology;  Laterality: Left;  . Silicon oil removal  9/31/1216    Procedure: SILICON OIL REMOVAL;  Surgeon: [name], MD;  Location: [facility];  Service: Ophthalmology;  Laterality: Left;  . Ptca    . Thrombectomy / arteriovenous graft revision    . Cardiac catheterization    . Coronary angioplasty  05/28/2014    stent placement to the mid RCA  . Left heart catheterization with coronary angiogram N/A 05/28/2013    Procedure: LEFT HEART CATHETERIZATION WITH CORONARY ANGIOGRAM;  Surgeon: [name], MD;  Location: [facility] CATH LAB;  Service: Cardiovascular;  Laterality: N/A;  . Cardiac catheterization N/A 05/02/2015    Procedure: Left Heart Cath and Coronary Angiography;  Surgeon: [name], MD;  Location: [facility] CV LAB;  Service: Cardiovascular;  Laterality: N/A;    Current Outpatient Rx  Name  Route  Sig  Dispense  Refill  . albuterol  (PROVENTIL HFA;VENTOLIN HFA) 108 (90 BASE) MCG/ACT inhaler   Inhalation   Inhale 2 puffs into the lungs every 6 (six) hours as needed for wheezing or shortness of breath.         Marland Kitchen amLODipine (NORVASC) 10 MG tablet   Oral   Take 1 tablet (10 mg total) by mouth daily.   30 tablet   0   . aspirin EC 81 MG tablet   Oral   Take 81 mg by mouth daily.         Marland Kitchen BAYER CONTOUR TEST test strip      TEST BLOOD SUGARS 2 TIMES DAILY   100 each   PRN   . benzonatate (TESSALON) 200 MG capsule   Oral  Take 1 capsule (200 mg total) by mouth 3 (three) times daily as needed for cough.   20 capsule   0   . Blood Glucose Monitoring Suppl (ONE TOUCH ULTRA SYSTEM KIT) W/DEVICE KIT      Please dispense for patient to use at home to check blood sugars. Patient taking differently: 1 kit by Other route See admin instructions. Pt uses to test blood sugars.   1 each   0   . calcium acetate (PHOSLO) 667 MG capsule   Oral   Take 667-2,668 mg by mouth 5 (five) times daily. Pt takes four capsules with meals and two capsules with snacks.         . calcium acetate (PHOSLO) 667 MG capsule   Oral   Take 2 capsules (1,334 mg total) by mouth 2 (two) times daily between meals.   90 capsule   0   . carvedilol (COREG) 12.5 MG tablet   Oral   Take 1 tablet (12.5 mg total) by mouth 2 (two) times daily.   60 tablet   6   . cloNIDine (CATAPRES) 0.1 MG tablet   Oral   Take 1 tablet (0.1 mg total) by mouth 2 (two) times daily.   60 tablet   11   . hydrALAZINE (APRESOLINE) 25 MG tablet   Oral   Take 1 tablet (25 mg total) by mouth every 8 (eight) hours.   90 tablet   0   . Insulin Detemir (LEVEMIR) 100 UNIT/ML Pen   Subcutaneous   Inject 5 Units into the skin daily.   15 mL   3   . Lancets Thin MISC      Using at home to test blood sugars twice daily. Patient taking differently: 1 each by Other route 2 (two) times daily. Pt uses to test blood sugars.   200 each   6   . levofloxacin  (LEVAQUIN) 250 MG tablet   Oral   Take 1 tablet (250 mg total) by mouth every other day.   5 tablet   0   . mometasone (NASONEX) 50 MCG/ACT nasal spray   Nasal   Place 2 sprays into the nose 2 (two) times daily as needed (for allergies).         . nitroGLYCERIN (NITROSTAT) 0.4 MG SL tablet   Sublingual   Place 1 tablet (0.4 mg total) under the tongue every 5 (five) minutes as needed for chest pain.   25 tablet   3   . NOVOFINE 32G X 6 MM MISC      USE AS DIRECTED   100 each   0   . pregabalin (LYRICA) 75 MG capsule   Oral   Take 1 capsule (75 mg total) by mouth daily.   30 capsule   3   . rosuvastatin (CRESTOR) 40 MG tablet   Oral   Take 1 tablet (40 mg total) by mouth daily.   90 tablet   3     Allergies Metformin and Hydrocodone  Family History  Problem Relation Age of Onset  . Anesthesia problems Neg Hx   . Hypotension Neg Hx   . Malignant hyperthermia Neg Hx   . Pseudochol deficiency Neg Hx   . Stroke Mother   . Heart attack Mother   . Heart disease Mother   . Colon cancer Father   . Colon cancer Sister   . Heart attack Brother   . Stroke Brother   . Diabetes Brother   .  Breast cancer Sister   . Diabetes Brother   . Diabetes Daughter     Social History Social History  Substance Use Topics  . Smoking status: Former Smoker -- 1.00 packs/day for 25 years    Quit date: 06/09/2010  . Smokeless tobacco: Never Used  . Alcohol Use: No    Review of Systems Constitutional: No fever/chills Eyes: No visual changes. ENT: No sore throat. Cardiovascular: Denies chest pain. Respiratory: As above  Gastrointestinal: No abdominal pain.  No nausea, no vomiting.  No diarrhea.  No constipation. Genitourinary: Negative for dysuria. Musculoskeletal: Negative for back pain. Skin: Negative for rash. Neurological: Negative for headaches, focal weakness or numbness.  10-point ROS otherwise negative.  ____________________________________________   PHYSICAL  EXAM:  VITAL SIGNS: ED Triage Vitals  Enc Vitals Group     BP 05/07/15 0930 182/96 mmHg     Pulse Rate 05/07/15 0923 75     Resp 05/07/15 0923 35     Temp --      Temp src --      SpO2 05/07/15 0918 91 %     Weight 05/07/15 0923 169 lb (76.658 kg)     Height 05/07/15 0923 '5\' 7"'  (1.702 m)     Head Cir --      Peak Flow --      Pain Score --      Pain Loc --      Pain Edu? --      Excl. in [place]? --     Constitutional: Alert and oriented. Wearing CPAP.  Eyes: Conjunctivae are normal. PERRL. EOMI. Head: Atraumatic. Nose: No congestion/rhinnorhea. Mouth/Throat: Mucous membranes are moist.  Oropharynx non-erythematous. Neck: No stridor.   Cardiovascular: Normal rate, regular rhythm. Grossly normal heart sounds.  Good peripheral circulation. Upper extremity dialysis access with palpable thrill. Respiratory: Tachypneic with labored respirations. Speaking in 2-3 word phrases. Rales to the mid fields bilaterally.  Gastrointestinal: Soft and nontender. No distention. No abdominal bruits. No CVA tenderness. Musculoskeletal: No lower extremity tenderness nor edema.  No joint effusions. Neurologic:  Normal speech and language. No gross focal neurologic deficits are appreciated. No gait instability. Skin:  Skin is warm, dry and intact. No rash noted. Psychiatric: Mood and affect are normal. Speech and behavior are normal.  ____________________________________________   LABS (all labs ordered are listed, but only abnormal results are displayed)  Labs Reviewed  CBC WITH DIFFERENTIAL/PLATELET - Abnormal; Notable for the following:    RBC 2.65 (*)    Hemoglobin 9.1 (*)    HCT 27.8 (*)    MCV 104.9 (*)    MCH 34.3 (*)    RDW 16.9 (*)    Platelets 142 (*)    Neutro Abs 7.0 (*)    Lymphs Abs 0.9 (*)    All other components within normal limits  BASIC METABOLIC PANEL - Abnormal; Notable for the following:    Potassium 5.9 (*)    Chloride 99 (*)    Glucose, Bld 119 (*)    BUN 76 (*)     Creatinine, Ser 9.94 (*)    Calcium 8.1 (*)    GFR calc non Af Amer 4 (*)    GFR calc Af Amer 4 (*)    All other components within normal limits  BLOOD GAS, ARTERIAL - Abnormal; Notable for the following:    pO2, Arterial 77 (*)    Allens test (pass/fail) POSITIVE (*)    All other components within normal limits  TROPONIN I  ____________________________________________  EKG  ED ECG REPORT I, Doran Stabler, the attending physician, personally viewed and interpreted this ECG.   Date: 05/07/2015  EKG Time: 923  Rate: 75  Rhythm: normal sinus rhythm  Axis: Normal axis  Intervals:none  ST&T Change: No ST segment elevation or depression. No abnormal T-wave inversion.  ____________________________________________  RADIOLOGY  CHF. I personally reviewed these films. ____________________________________________   PROCEDURES  CRITICAL CARE Performed by: Doran Stabler   Total critical care time: 35 minutes  Critical care time was exclusive of separately billable procedures and treating other patients.  Critical care was necessary to treat or prevent imminent or life-threatening deterioration.  Critical care was time spent personally by me on the following activities: development of treatment plan with patient and/or surrogate as well as nursing, discussions with consultants, evaluation of patient's response to treatment, examination of patient, obtaining history from patient or surrogate, ordering and performing treatments and interventions, ordering and review of laboratory studies, ordering and review of radiographic studies, pulse oximetry and re-evaluation of patient's condition.   ____________________________________________   INITIAL IMPRESSION / ASSESSMENT AND PLAN / ED COURSE  Pertinent labs & imaging results that were available during my care of the patient were reviewed by me and considered in my medical decision making (see chart for  details).  ----------------------------------------- 10:20 AM on 05/07/2015 -----------------------------------------  Patient tolerating the BiPAP well. Patient was started on a nitroglycerin drip and is now with decreased respiratory distress. Respiratory rate has slowed. We are currently waiting the patient off of off of the nitro drip. I discussed the case with Dr. Holley Raring who recommends IV hydralazine at 10 mg. He will arrange for dialysis. Signed out to Dr. Genia Harold of the medicine service will admit the patient to the hospital. Discussed Lasix with Dr. Holley Raring and we will hold on Lasix now and see how the patient reacts to the hydralazine. ____________________________________________   FINAL CLINICAL IMPRESSION(S) / ED DIAGNOSES   CHF. Uncontrolled hypertension.   Orbie Pyo, MD 05/07/15 1021  Orbie Pyo, MD 05/07/15 1021

## 2015-05-07 NOTE — ED Notes (Signed)
Spoke with charge nurse regarding pt going to dialysis prior to coming to floor. 2A charge nurse states okay with patient going to dialysis then to the floor at this time. Consent for dialysis received and placed in chart to go downstairs to dialysis with patient at this time.

## 2015-05-07 NOTE — Progress Notes (Signed)
Pre-hd tx 

## 2015-05-07 NOTE — ED Notes (Signed)
Per EMS pt started having respiratory distress early this morning. Pt was D/C on Friday 11/25 for respiratory problems with a dx of bronchitis. Pt presents on CPAP at this time. Per EMS pt is a dialysis patient with access on L arm. Pt was last dialyzed Friday afternoon and was supposed to receive next treatment on 11/28 afternoon. Per EMS pt was given 125 Solu-medrol en route. Pt has 22 in R hand for IV access. Pt noted to be sitting upright at this time with mask on.

## 2015-05-08 ENCOUNTER — Telehealth: Payer: Self-pay | Admitting: Internal Medicine

## 2015-05-08 LAB — GLUCOSE, CAPILLARY
GLUCOSE-CAPILLARY: 171 mg/dL — AB (ref 65–99)
Glucose-Capillary: 148 mg/dL — ABNORMAL HIGH (ref 65–99)

## 2015-05-08 LAB — PARATHYROID HORMONE, INTACT (NO CA): PTH: 363 pg/mL — AB (ref 15–65)

## 2015-05-08 LAB — BASIC METABOLIC PANEL
Anion gap: 9 (ref 5–15)
BUN: 43 mg/dL — AB (ref 6–20)
CO2: 32 mmol/L (ref 22–32)
CREATININE: 6.06 mg/dL — AB (ref 0.44–1.00)
Calcium: 8.2 mg/dL — ABNORMAL LOW (ref 8.9–10.3)
Chloride: 97 mmol/L — ABNORMAL LOW (ref 101–111)
GFR calc Af Amer: 8 mL/min — ABNORMAL LOW (ref >60)
GFR, EST NON AFRICAN AMERICAN: 7 mL/min — AB (ref >60)
Glucose, Bld: 193 mg/dL — ABNORMAL HIGH (ref 65–99)
POTASSIUM: 4.6 mmol/L (ref 3.5–5.1)
SODIUM: 138 mmol/L (ref 135–145)

## 2015-05-08 LAB — MRSA PCR SCREENING: MRSA BY PCR: NEGATIVE

## 2015-05-08 MED ORDER — HYDRALAZINE HCL 50 MG PO TABS: 50.0000 mg | ORAL_TABLET | Freq: Three times a day (TID) | ORAL | Status: DC

## 2015-05-08 NOTE — Progress Notes (Signed)
[facility]  Patient Status: active  [facility] is providing the following services: SN/PT  If patient discharges after hours, please call [phone].   Theresa [name] 05/08/2015, 10:34 AM

## 2015-05-08 NOTE — Progress Notes (Signed)
Post hd tx 

## 2015-05-08 NOTE — Telephone Encounter (Signed)
Thank you.  I will continue to follow.

## 2015-05-08 NOTE — Progress Notes (Signed)
[facility]  ROUNDING NOTE   Subjective:  Pt doing much better after dialysis yesterday. Blood pressure under better control as well. Reports she still having a little bit of shortness of breath. Discussed performing asked her dialysis treatment today.     Objective:  Vital signs in last 24 hours:  Temp:  [98.2 F (36.8 C)-98.9 F (37.2 C)] 98.7 F (37.1 C) (11/29 0407) Pulse Rate:  [70-86] 79 (11/29 0407) Resp:  [13-35] 18 (11/29 0407) BP: (146-182)/(48-96) 146/52 mmHg (11/29 0407) SpO2:  [91 %-100 %] 96 % (11/29 0746) Weight:  [76.658 kg (169 lb)-77.61 kg (171 lb 1.6 oz)] 77.61 kg (171 lb 1.6 oz) (11/28 1944)  Weight change:  Filed Weights   05/07/15 0923 05/07/15 1530 05/07/15 1944  Weight: 76.658 kg (169 lb) 76.658 kg (169 lb) 77.61 kg (171 lb 1.6 oz)    Intake/Output: I/O last 3 completed shifts: In: -  Out: 3000 [Other:3000]   Intake/Output this shift:     Physical Exam: General:  no acute distress   Head: Normocephalic, atraumatic. Moist oral mucosal membranes  Eyes: Anicteric  Neck: Supple, trachea midline  Lungs:  Basilar rales, normal effort   Heart: S1S2 no rubs  Abdomen:  Soft, nontender, BS present   Extremities:  trace peripheral edema.  Neurologic: Nonfocal, moving all four extremities  Skin: No lesions  Access: LUE AVF    Basic Metabolic Panel:  Recent Labs Lab 05/02/15 1153 05/03/15 0816 05/04/15 0522 05/04/15 1234 05/07/15 0927 05/07/15 1618 05/08/15 0440  NA 141 140 137  --  137  --  138  K 4.4 4.7 5.6*  --  5.9*  --  4.6  CL 100* 98* 98*  --  99*  --  97*  CO2 32 32 28  --  26  --  32  GLUCOSE 116* 103* 106*  --  119*  --  193*  BUN 35* 34* 55*  --  76*  --  43*  CREATININE 6.80* 5.80* 7.75*  --  9.94*  --  6.06*  CALCIUM 8.4* 8.3* 8.7*  --  8.1*  --  8.2*  PHOS  --   --   --  4.8*  --  6.3*  --     Liver Function Tests: No results for input(s): AST, ALT, ALKPHOS, BILITOT, PROT, ALBUMIN in the last 168  hours. No results for input(s): LIPASE, AMYLASE in the last 168 hours. No results for input(s): AMMONIA in the last 168 hours.  CBC:  Recent Labs Lab 05/02/15 1153 05/03/15 0816 05/04/15 0523 05/07/15 0927  WBC 9.1 7.6 8.7 8.6  NEUTROABS  --   --   --  7.0*  HGB 9.4* 8.9* 9.0* 9.1*  HCT 28.1* 27.2* 27.4* 27.8*  MCV 104.6* 105.8* 105.8* 104.9*  PLT 153 137* 134* 142*    Cardiac Enzymes:  Recent Labs Lab 05/02/15 1153 05/07/15 0927  TROPONINI 0.05* <0.03    BNP: Invalid input(s): POCBNP  CBG:  Recent Labs Lab 05/04/15 0731 05/04/15 1204 05/07/15 1357 05/07/15 1956 05/08/15 0730  GLUCAP 122* 214* 171* 148* 148*    Microbiology: Results for orders placed or performed during the hospital encounter of 05/07/15  MRSA PCR Screening     Status: None   Collection Time: 05/08/15  5:15 AM  Result Value Ref Range Status   MRSA by PCR NEGATIVE NEGATIVE Final    Comment:        The GeneXpert MRSA Assay (FDA approved for NASAL specimens only), is  one component of a comprehensive MRSA colonization surveillance program. It is not intended to diagnose MRSA infection nor to guide or monitor treatment for MRSA infections.     Coagulation Studies: No results for input(s): LABPROT, INR in the last 72 hours.  Urinalysis: No results for input(s): COLORURINE, LABSPEC, PHURINE, GLUCOSEU, HGBUR, BILIRUBINUR, KETONESUR, PROTEINUR, UROBILINOGEN, NITRITE, LEUKOCYTESUR in the last 72 hours.  Invalid input(s): APPERANCEUR    Imaging: Dg Chest 1 View  05/07/2015  CLINICAL DATA:  Shortness of breath EXAM: CHEST 1 VIEW COMPARISON:  3 days ago FINDINGS: Borderline cardiopericardial enlargement. Stable aortic and hilar contours when accounting for rightward rotation. There is hazy appearance of the bilateral chest, likely combination of pulmonary edema and layering pleural effusions. No pneumothorax. No asymmetric airspace disease. IMPRESSION: CHF pattern. Electronically Signed    By: Monte Fantasia M.D.   On: 05/07/2015 09:51     Medications:     . amLODipine  10 mg Oral Daily  . aspirin EC  81 mg Oral Daily  . calcium acetate  1,334 mg Oral BID BM  . calcium acetate  2,668 mg Oral TID WC  . carvedilol  12.5 mg Oral BID  . cloNIDine  0.1 mg Oral BID  . heparin  5,000 Units Subcutaneous 3 times per day  . hydrALAZINE  25 mg Oral 3 times per day  . insulin aspart  0-5 Units Subcutaneous QHS  . insulin aspart  0-9 Units Subcutaneous TID WC  . insulin detemir  5 Units Subcutaneous Daily  . levofloxacin  250 mg Oral Q48H  . pregabalin  75 mg Oral Daily  . rosuvastatin  40 mg Oral Daily  . sodium chloride  3 mL Intravenous Q12H   sodium chloride, sodium chloride, acetaminophen **OR** acetaminophen, albuterol, alteplase, alum & mag hydroxide-simeth, benzonatate, diphenhydrAMINE, heparin, HYDROcodone-acetaminophen, lidocaine (PF), lidocaine-prilocaine, morphine injection, nitroGLYCERIN, ondansetron **OR** ondansetron (ZOFRAN) IV, pentafluoroprop-tetrafluoroeth, senna-docusate  Assessment/ Plan:  59 y.o. female with past medical history of hypertension, end-stage renal disease on hemodialysis, coronary artery disease status post cardiac catheterization 05/02/2015, COPD, peripheral vascular disease, sickle cell trait, anemia of chronic kidney disease, diabetes mellitus, hyperlipidemia, left upper extremity AV fistula, carotid artery stenosis, history of tobacco abuse  1. End-stage renal disease on hemodialysis. Patient's shortness of breath significantly improved. We will plan for another dialysis treatment today with ultrafiltration target of 1 kg.  2. Hypertension. Blood pressure control improved. Continue amlodipine, Coreg, clonidine, and hydralazine.  3. Anemia of chronic kidney disease. Hemoglobin 9.1. Restart Epogen tomorrow.  4. Secondary hyperparathyroidism.  Phosphorus high at 6.3. Continue PhosLo as prescribed and follow-up phosphorus tomorrow.  5.  Acute  Respiratory failure:  Improved significantly with BiPAP, nitroglycerin drip, and dialysis. We will plan for an additional dialysis treatment today with ultrafiltration target of 1 kg..   LOS: 1 Andree Heeg 11/29/20167:55 AM

## 2015-05-08 NOTE — Care Management (Signed)
Patient admitted from home with Acute hypoxic respiratory failure.  Patient lives at home with her granddaughter.  Patient is on chronic home O2.  Daughter to bring portable tank at time of discharge.  [facility] and [facility].  Resumption of care orders in place.  Corene Cornea from [facility] notified of plan to discharge.  Patient uses a cane for ambulation.  Patient states that various family members provide her with transportation.  Patient is an HD patient with regular days being T TH S.  No further RNCM needs identified

## 2015-05-08 NOTE — Progress Notes (Signed)
HD tx start 

## 2015-05-08 NOTE — Progress Notes (Signed)
Pt. Discharged to home with her home o2 on (brought by daughter) via wc. Discharge instructions and medication regimen reviewed at bedside with patient. Pt. verbalizes understanding of instructions and medication regimen. Prescriptions sent to pharmacy, pt is aware. Reiterated with patient the importance of buying a scale so pt can weight herself daily. CHF education reviewed with provided handouts. Pt verbalizes understanding. Patient assessment unchanged from this morning. TELE and IV discontinued per policy.

## 2015-05-08 NOTE — Discharge Summary (Signed)
[facility] [place] NAME: Theresa [name]    MR#:  [id number]  DATE OF BIRTH:  22-Dec-1955  DATE OF ADMISSION:  05/07/2015 ADMITTING PHYSICIAN: Bettey Costa, MD  DATE OF DISCHARGE: 05/08/2015 PRIMARY CARE PHYSICIAN: Rica Mast, MD    ADMISSION DIAGNOSIS:  Uncontrolled hypertension [I10] Acute congestive heart failure, unspecified congestive heart failure type ([place]) [I50.9]  DISCHARGE DIAGNOSIS:  Active Problems:   Diastolic heart failure ([place])   SECONDARY DIAGNOSIS:   Past Medical History  Diagnosis Date  . Hypertension   . ESRD on hemodialysis ([place])     a. [facility] in [place], [place], on a TTS schedule.  She started dialysis in Feb 2014.  Etiology of renal failure not known, likely diabetes.  Has a left upper arm AV graft.  Marland Kitchen COPD (chronic obstructive pulmonary disease) ([place])   . Emphysema   . History of pneumonia     June 2012  . History of bronchitis     Mar 2012  . Peripheral vascular disease ([place])   . Chronic constipation   . Colon polyps   . Sickle cell trait ([place])   . Anemia of chronic disease   . Diabetes mellitus   . Moderate mitral insufficiency     a. 10/2013 Echo: EF 55-60%, mod MR.  . Diabetic neuropathy ([place])   . Diabetic retinopathy ([place]) 05/28/2013    Hx bilat retinal detachment, proliferative diab retinopathy and bilat vitreous hemorrhage   . Hyperlipidemia   . Coronary artery disease     a. 05/2013 NSTEMI/PCI: LM 20d, LAD min irregs, LCX small, nl, OM1 nl, RCA dom 83m (2.5x16 Promus DES), PDA1 80p.  . Chronic diastolic CHF (congestive heart failure) ([place])     a. 10/2013 Echo [facility]): EF 55-60%, mod conc LVH, mod MR, mildly dil LA, mild Ao sclerosis w/o stenosis.  . Carotid arterial disease ([place])     a. 02/2013 U/S: 40-59% bilat ICA stenosis - *f/u 02/2014*  . History of tobacco abuse     a. Quit 2012.    HOSPITAL COURSE:   This is a very pleasant 59 year old female with a history of end-stage  renal disease on hemodialysis and coronary artery disease who presented with malignant hypertension and pulmonary edema. For further details please further H&P.  1. Acute hypoxic respiratory failure: This is secondary to acute on chronic dorsal carpal failure with pulmonary edema from end-stage renal disease. Patient initially was placed on BiPAP and was transitioned to nasal cannula. Patient's oxygen saturation remains 91-98% on room air. However right before dialysis it was noted to be at 89%.  2. Acute on chronic diastolic heart failure: This is secondary to end-stage renal disease on hemodialysis and needing dialysis. Patient has received 2 treatments of dialysis 3 hospital relation and is doing well.  3. Malignant hypertension: This is also likely etiology of her pulmonary edema. She was on a nitroglycerin drip which was discontinued as her outpatient treatment was restarted. I increased her hydralazine and she will need further outpatient follow-up.  4. Type 2 diabetes: Continue her outpatient medications.  I. Hyperlipidemia: Continue statin   DISCHARGE CONDITIONS AND DIET:  Patient will be discharged home with home health care on ADA/renal diet  CONSULTS OBTAINED:  Treatment Team:  Anthonette Legato, MD  DRUG ALLERGIES:   Allergies  Allergen Reactions  . Metformin Diarrhea  . Hydrocodone Hives    DISCHARGE MEDICATIONS:   Current Discharge Medication List    CONTINUE these  medications which have CHANGED   Details  hydrALAZINE (APRESOLINE) 50 MG tablet Take 1 tablet (50 mg total) by mouth every 8 (eight) hours. Qty: 90 tablet, Refills: 0      CONTINUE these medications which have NOT CHANGED   Details  albuterol (PROVENTIL HFA;VENTOLIN HFA) 108 (90 BASE) MCG/ACT inhaler Inhale 2 puffs into the lungs every 6 (six) hours as needed for wheezing or shortness of breath.    albuterol (PROVENTIL) (2.5 MG/3ML) 0.083% nebulizer solution Take 2.5 mg by nebulization every 4 (four)  hours as needed for wheezing or shortness of breath.    amLODipine (NORVASC) 10 MG tablet Take 1 tablet (10 mg total) by mouth daily. Qty: 30 tablet, Refills: 0    aspirin EC 81 MG tablet Take 81 mg by mouth daily.    benzonatate (TESSALON) 200 MG capsule Take 1 capsule (200 mg total) by mouth 3 (three) times daily as needed for cough. Qty: 20 capsule, Refills: 0    calcium acetate (PHOSLO) 667 MG capsule Take 1,334 mg by mouth 2 (two) times daily with a meal.    carvedilol (COREG) 12.5 MG tablet Take 1 tablet (12.5 mg total) by mouth 2 (two) times daily. Qty: 60 tablet, Refills: 6   Associated Diagnoses: Essential hypertension, benign    cloNIDine (CATAPRES) 0.1 MG tablet Take 1 tablet (0.1 mg total) by mouth 2 (two) times daily. Qty: 60 tablet, Refills: 11    Insulin Detemir (LEVEMIR) 100 UNIT/ML Pen Inject 5 Units into the skin daily. Qty: 15 mL, Refills: 3    levofloxacin (LEVAQUIN) 250 MG tablet Take 1 tablet (250 mg total) by mouth every other day. Qty: 5 tablet, Refills: 0    multivitamin (RENA-VIT) TABS tablet Take 1 tablet by mouth daily.    nitroGLYCERIN (NITROSTAT) 0.4 MG SL tablet Place 1 tablet (0.4 mg total) under the tongue every 5 (five) minutes as needed for chest pain. Qty: 25 tablet, Refills: 3    pregabalin (LYRICA) 75 MG capsule Take 1 capsule (75 mg total) by mouth daily. Qty: 30 capsule, Refills: 3    rosuvastatin (CRESTOR) 40 MG tablet Take 1 tablet (40 mg total) by mouth daily. Qty: 90 tablet, Refills: 3              Today   CHIEF COMPLAINT:  Patient is doing well. Patient was seen while in dialysis this morning. Patient has no shortness of breath or chest pain.   VITAL SIGNS:  Blood pressure 153/58, pulse 75, temperature 98.2 F (36.8 C), temperature source Oral, resp. rate 15, height 5\' 7"  (1.702 m), weight 79.2 kg (174 lb 9.7 oz), SpO2 99 %.   REVIEW OF SYSTEMS:  Review of Systems  Constitutional: Negative for fever, chills and  malaise/fatigue.  HENT: Negative for sore throat.   Eyes: Negative for blurred vision.  Respiratory: Negative for cough, hemoptysis, shortness of breath and wheezing.   Cardiovascular: Negative for chest pain, palpitations and leg swelling.  Gastrointestinal: Negative for nausea, vomiting, abdominal pain, diarrhea and blood in stool.  Genitourinary: Negative for dysuria.  Musculoskeletal: Negative for back pain.  Neurological: Negative for dizziness, tremors and headaches.  Endo/Heme/Allergies: Does not bruise/bleed easily.     PHYSICAL EXAMINATION:  GENERAL:  59 y.o.-year-old patient lying in the bed with no acute distress.  NECK:  Supple, no jugular venous distention. No thyroid enlargement, no tenderness.  LUNGS: Normal breath sounds bilaterally, no wheezing, rales,rhonchi  No use of accessory muscles of respiration.  CARDIOVASCULAR: S1, S2 normal.  No murmurs, rubs, or gallops.  ABDOMEN: Soft, non-tender, non-distended. Bowel sounds present. No organomegaly or mass.  EXTREMITIES: No pedal edema, cyanosis, or clubbing.  PSYCHIATRIC: The patient is alert and oriented x 3.  SKIN: No obvious rash, lesion, or ulcer.   DATA REVIEW:   CBC  Recent Labs Lab 05/07/15 0927  WBC 8.6  HGB 9.1*  HCT 27.8*  PLT 142*    Chemistries   Recent Labs Lab 05/08/15 0440  NA 138  K 4.6  CL 97*  CO2 32  GLUCOSE 193*  BUN 43*  CREATININE 6.06*  CALCIUM 8.2*    Cardiac Enzymes  Recent Labs Lab 05/02/15 1153 05/07/15 0927  TROPONINI 0.05* <0.03    Microbiology Results  @[email or web address] @  RADIOLOGY:  Dg Chest 1 View  05/07/2015  CLINICAL DATA:  Shortness of breath EXAM: CHEST 1 VIEW COMPARISON:  3 days ago FINDINGS: Borderline cardiopericardial enlargement. Stable aortic and hilar contours when accounting for rightward rotation. There is hazy appearance of the bilateral chest, likely combination of pulmonary edema and layering pleural effusions. No pneumothorax. No asymmetric  airspace disease. IMPRESSION: CHF pattern. Electronically Signed   By: Monte Fantasia M.D.   On: 05/07/2015 09:51      Management plans discussed with the patient and she is in agreement. Stable for discharge home  Patient should follow up with PCP in one week  CODE STATUS:     Code Status Orders        Start     Ordered   05/07/15 1944  Full code   Continuous     05/07/15 1943      TOTAL TIME TAKING CARE OF THIS PATIENT: 35 minutes.    Note: This dictation was prepared with Dragon dictation along with smaller phrase technology. Any transcriptional errors that result from this process are unintentional.  Kaidon Kinker M.D on 05/08/2015 at 12:35 PM  Between 7am to 6pm - Pager - [phone] After 6pm go to [email or web address] - password [email or web address]  Office  [phone]  CC: Primary care physician; Rica Mast, MD

## 2015-05-08 NOTE — Telephone Encounter (Signed)
HFU, Pt is being discharged today from the hospital. Diagnosis is Heart Failure. No appt avail to sch. Let me know where to sch pt. Thank You!

## 2015-05-08 NOTE — Discharge Instructions (Signed)
Heart Failure Clinic appointment on May 21, 2015 at 1:00pm with Darylene Price, [place]. Please call [phone] to reschedule.

## 2015-05-08 NOTE — Progress Notes (Signed)
Pre-hd tx 

## 2015-05-08 NOTE — Progress Notes (Signed)
   05/08/15 0915  Oxygen Therapy  SpO2 (!) 88 %  O2 Device Room Air (at rest)

## 2015-05-08 NOTE — Progress Notes (Signed)
HD tx completed.

## 2015-05-08 NOTE — Progress Notes (Signed)
SATURATION QUALIFICATIONS: (This note is used to comply with regulatory documentation for home oxygen)  Patient Saturations on Room Air at Rest = 88%  Patient Saturations on Room Air while Ambulating = N/A  Patient Saturations on N/A Liters of oxygen while Ambulating = N/A  Please briefly explain why patient needs home oxygen:

## 2015-05-09 ENCOUNTER — Other Ambulatory Visit: Payer: Self-pay | Admitting: *Deleted

## 2015-05-09 ENCOUNTER — Telehealth: Payer: Self-pay

## 2015-05-09 DIAGNOSIS — N2581 Secondary hyperparathyroidism of renal origin: Secondary | ICD-10-CM | POA: Diagnosis not present

## 2015-05-09 DIAGNOSIS — D509 Iron deficiency anemia, unspecified: Secondary | ICD-10-CM | POA: Diagnosis not present

## 2015-05-09 DIAGNOSIS — I25111 Atherosclerotic heart disease of native coronary artery with angina pectoris with documented spasm: Secondary | ICD-10-CM | POA: Diagnosis not present

## 2015-05-09 DIAGNOSIS — I739 Peripheral vascular disease, unspecified: Secondary | ICD-10-CM | POA: Diagnosis not present

## 2015-05-09 DIAGNOSIS — I120 Hypertensive chronic kidney disease with stage 5 chronic kidney disease or end stage renal disease: Secondary | ICD-10-CM | POA: Diagnosis not present

## 2015-05-09 DIAGNOSIS — E1165 Type 2 diabetes mellitus with hyperglycemia: Secondary | ICD-10-CM | POA: Diagnosis not present

## 2015-05-09 DIAGNOSIS — N186 End stage renal disease: Secondary | ICD-10-CM | POA: Diagnosis not present

## 2015-05-09 DIAGNOSIS — D631 Anemia in chronic kidney disease: Secondary | ICD-10-CM | POA: Diagnosis not present

## 2015-05-09 DIAGNOSIS — I214 Non-ST elevation (NSTEMI) myocardial infarction: Secondary | ICD-10-CM | POA: Diagnosis not present

## 2015-05-09 DIAGNOSIS — E119 Type 2 diabetes mellitus without complications: Secondary | ICD-10-CM | POA: Diagnosis not present

## 2015-05-09 DIAGNOSIS — E1122 Type 2 diabetes mellitus with diabetic chronic kidney disease: Secondary | ICD-10-CM | POA: Diagnosis not present

## 2015-05-09 NOTE — Telephone Encounter (Signed)
Ok. Thanks!

## 2015-05-09 NOTE — Patient Outreach (Signed)
Unsuccessful outreach for [facility] transition of care. HIPPA compliant message left.   Plan: RNCM will await return call  RNCM will attempt outreach again tomorrow.  Rutherford Limerick RN, BSN  [facility] Care Management [phone])

## 2015-05-09 NOTE — Telephone Encounter (Signed)
Transition Care Management Follow-up Telephone Call   Date discharged? 05/08/15   How have you been since you were released from the hospital? Doing better. No SOB. No chest pain. No headache. Using home oxygen.   Do you understand why you were in the hospital? Yes and I am feeling much better, not quite 100%, but better.   Do you understand the discharge instructions? Yes, no problems.  I am using the oxygen as directed and moving slowing between activities.   Where were you discharged to? Home   Items Reviewed:  Medications reviewed: Yes, doing ok with HYDRALAZINE 50mg .  Continuing all other medications as prescribed.  Allergies reviewed: Yes, no changes.  Dietary changes reviewed: Yes, doing ok with diabetic/renal diet.  Referrals reviewed: Yes, appointment made.   Functional Questionnaire:   Activities of Daily Living (ADLs):   She states they are independent in the following: Independent with ADLs  States they require assistance with the following: Doing okay right now with ADLs, but home health to assist with bathing and dressing soon.   Any transportation issues/concerns?: No.   Any patient concerns? Not at this time.   Confirmed importance and date/time of follow-up visits scheduled Yes, appointment made 05/10/15 at 1:00.  Provider Appointment booked with Dr. Gilford Rile (PCP).  Confirmed with patient if condition begins to worsen call PCP or go to the ER.  Patient was given the office number and encouraged to call back with question or concerns.  : Yes, patient verbalized understanding.

## 2015-05-10 ENCOUNTER — Other Ambulatory Visit: Payer: Self-pay | Admitting: *Deleted

## 2015-05-10 ENCOUNTER — Ambulatory Visit (INDEPENDENT_AMBULATORY_CARE_PROVIDER_SITE_OTHER): Payer: Medicare Other | Admitting: Internal Medicine

## 2015-05-10 ENCOUNTER — Encounter: Payer: Self-pay | Admitting: Internal Medicine

## 2015-05-10 VITALS — BP 144/65 | HR 67 | Temp 98.1°F | Resp 18 | Ht 67.0 in | Wt 170.2 lb

## 2015-05-10 DIAGNOSIS — I10 Essential (primary) hypertension: Secondary | ICD-10-CM

## 2015-05-10 DIAGNOSIS — N186 End stage renal disease: Secondary | ICD-10-CM

## 2015-05-10 DIAGNOSIS — Z992 Dependence on renal dialysis: Secondary | ICD-10-CM

## 2015-05-10 DIAGNOSIS — I5033 Acute on chronic diastolic (congestive) heart failure: Secondary | ICD-10-CM | POA: Diagnosis not present

## 2015-05-10 MED ORDER — HYDRALAZINE HCL 25 MG PO TABS: 25.0000 mg | ORAL_TABLET | Freq: Three times a day (TID) | ORAL | Status: DC

## 2015-05-10 NOTE — Progress Notes (Signed)
Pre visit review using our clinic review tool, if applicable. No additional management support is needed unless otherwise documented below in the visit note. 

## 2015-05-10 NOTE — Assessment & Plan Note (Signed)
Symptoms of acute on chronic CHF improved. Euvolemic on exam today. Suspect exacerbation related to viral URI.  Will recheck in 2 weeks and prn. Continue supplemental oxygen on exertion.

## 2015-05-10 NOTE — Assessment & Plan Note (Signed)
BP running lower with higher dose of Hydralazine. Will decrease dose to 25mg  tid. Follow up in 2 weeks and prn.

## 2015-05-10 NOTE — Patient Outreach (Signed)
Unsuccessful outreach for [facility] outreach for transition of care. HIPAA compliant message left.   Plan: RNCM will await return call  RNCM will attempt outreach again in 2 days.  Rutherford Limerick RN, BSN  [facility] Care Management [phone])

## 2015-05-10 NOTE — Patient Instructions (Signed)
Decrease Hydralazine to 25mg  three times daily.  Monitor blood pressure at home and at dialysis.   Follow up in 2 weeks.

## 2015-05-10 NOTE — Progress Notes (Addendum)
Subjective:    Patient ID: Theresa [name], female    DOB: 1955/09/24, 59 y.o.   MRN: [id number]  HPI  59YO female presents for hospital follow up.  ADMITTED: 05/07/2015 DISCHARGED: 05/08/2015  DIAGNOSIS:Uncontrolled hypertension, Acute CHF  Ms. Ricker  Initially presented with dyspnea. She was taken to the ED via EMS. During hospital stay, symptoms improved with HD x 2. Her dose of hydralazine was increased. Continues on oxygen at home. Feels short of breath at times with walking. BP at home and HD has been low. Has been feeling lightheaded. Had HD yesterday.   BG have been low at times. Low 100s mostly.  Wt Readings from Last 3 Encounters:  05/10/15 170 lb 4 oz (77.225 kg)  05/08/15 169 lb 11.2 oz (76.975 kg)  05/04/15 174 lb 9.7 oz (79.2 kg)   BP Readings from Last 3 Encounters:  05/10/15 144/65  05/08/15 145/49  05/04/15 174/57    Past Medical History  Diagnosis Date  . Hypertension   . ESRD on hemodialysis ([place])     a. [facility] in [place], [place], on a TTS schedule.  She started dialysis in Feb 2014.  Etiology of renal failure not known, likely diabetes.  Has a left upper arm AV graft.  Marland Kitchen COPD (chronic obstructive pulmonary disease) ([place])   . Emphysema   . History of pneumonia     June 2012  . History of bronchitis     Mar 2012  . Peripheral vascular disease ([place])   . Chronic constipation   . Colon polyps   . Sickle cell trait ([place])   . Anemia of chronic disease   . Diabetes mellitus   . Moderate mitral insufficiency     a. 10/2013 Echo: EF 55-60%, mod MR.  . Diabetic neuropathy ([place])   . Diabetic retinopathy ([place]) 05/28/2013    Hx bilat retinal detachment, proliferative diab retinopathy and bilat vitreous hemorrhage   . Hyperlipidemia   . Coronary artery disease     a. 05/2013 NSTEMI/PCI: LM 20d, LAD min irregs, LCX small, nl, OM1 nl, RCA dom 4m (2.5x16 Promus DES), PDA1 80p.  . Chronic diastolic CHF (congestive heart failure) ([place])     a. 10/2013 Echo  [facility]): EF 55-60%, mod conc LVH, mod MR, mildly dil LA, mild Ao sclerosis w/o stenosis.  . Carotid arterial disease ([place])     a. 02/2013 U/S: 40-59% bilat ICA stenosis - *f/u 02/2014*  . History of tobacco abuse     a. Quit 2012.   Family History  Problem Relation Age of Onset  . Anesthesia problems Neg Hx   . Hypotension Neg Hx   . Malignant hyperthermia Neg Hx   . Pseudochol deficiency Neg Hx   . Stroke Mother   . Heart attack Mother   . Heart disease Mother   . Colon cancer Father   . Colon cancer Sister   . Heart attack Brother   . Stroke Brother   . Diabetes Brother   . Breast cancer Sister   . Diabetes Brother   . Diabetes Daughter    Past Surgical History  Procedure Laterality Date  . Abdominal hysterectomy      2000  . Dilation and curettage of uterus      several in the early 80's  . Eye surgery      bilateral laser 2012  . Esophagogastroduodenoscopy      2012  . Colonscopy    . Tubal ligation      1979  .  Pars plana vitrectomy  04/22/2011    Procedure: PARS PLANA VITRECTOMY WITH 25 GAUGE;  Surgeon: [name], MD;  Location: [place];  Service: Ophthalmology;  Laterality: Left;  membrane peel, endolaser, gas fluid exchange, silicone oil, repair of complex traction retinal detachment  . Pars plana vitrectomy  09/30/2011    Procedure: PARS PLANA VITRECTOMY WITH 25 GAUGE;  Surgeon: [name], MD;  Location: [place];  Service: Ophthalmology;  Laterality: Right;  Endolaser; Repair of Complex Traction Retinal Detachment  . Gas/fluid exchange  09/30/2011    Procedure: GAS/FLUID EXCHANGE;  Surgeon: [name], MD;  Location: [place];  Service: Ophthalmology;  Laterality: Right;  . Gas insertion  09/30/2011    Procedure: INSERTION OF GAS;  Surgeon: [name], MD;  Location: [place];  Service: Ophthalmology;  Laterality: Right;  C3F8  . Eye surgery      right  . Pars plana vitrectomy  02/24/2012    Procedure: PARS PLANA VITRECTOMY WITH 25 GAUGE;  Surgeon: [name], MD;  Location: [place];  Service: Ophthalmology;  Laterality: Left;  . Silicon oil removal  [id number]    Procedure: SILICON OIL REMOVAL;  Surgeon: [name], MD;  Location: [place];  Service: Ophthalmology;  Laterality: Left;  . Ptca    . Thrombectomy / arteriovenous graft revision    . Cardiac catheterization    . Coronary angioplasty  05/28/2014    stent placement to the mid RCA  . Left heart catheterization with coronary angiogram N/A 05/28/2013    Procedure: LEFT HEART CATHETERIZATION WITH CORONARY ANGIOGRAM;  Surgeon: [name], MD;  Location: [facility] CATH LAB;  Service: Cardiovascular;  Laterality: N/A;  . Cardiac catheterization N/A 05/02/2015    Procedure: Left Heart Cath and Coronary Angiography;  Surgeon: [name], MD;  Location: [place] CV LAB;  Service: Cardiovascular;  Laterality: N/A;   Social History   Social History  . Marital Status: Divorced    Spouse Name: N/A  . Number of Children: 2  . Years of Education: N/A   Occupational History  . [place] History Main Topics  . Smoking status: Former Smoker -- 1.00 packs/day for 25 years    Quit date: 06/09/2010  . Smokeless tobacco: Never Used  . Alcohol Use: No  . Drug Use: No  . Sexual Activity: No   Other Topics Concern  . None   Social History Narrative    Review of Systems  Constitutional: Negative for fever, chills, appetite change, fatigue and unexpected weight change.  Eyes: Negative for visual disturbance.  Respiratory: Positive for shortness of breath (with exertion). Negative for cough, chest tightness and wheezing.   Cardiovascular: Negative for chest pain, palpitations and leg swelling.  Gastrointestinal: Negative for nausea, vomiting, abdominal pain, diarrhea and constipation.  Musculoskeletal: Negative for myalgias and arthralgias.  Skin: Negative for color change and rash.  Hematological: Negative for adenopathy. Does not bruise/bleed easily.   Psychiatric/Behavioral: Negative for sleep disturbance and dysphoric mood. The patient is not nervous/anxious.        Objective:    BP 144/65 mmHg  Pulse 67  Temp(Src) 98.1 F (36.7 C) (Oral)  Resp 18  Ht 5\' 7"  (1.702 m)  Wt 170 lb 4 oz (77.225 kg)  BMI 26.66 kg/m2  SpO2 100% Physical Exam  Constitutional: She is oriented to person, place, and time. She appears well-developed and well-nourished. No distress.  HENT:  Head: Normocephalic and  atraumatic.  Right Ear: External ear normal.  Left Ear: External ear normal.  Nose: Nose normal.  Mouth/Throat: Oropharynx is clear and moist. No oropharyngeal exudate.  Eyes: Conjunctivae are normal. Pupils are equal, round, and reactive to light. Right eye exhibits no discharge. Left eye exhibits no discharge. No scleral icterus.  Neck: Normal range of motion. Neck supple. No tracheal deviation present. No thyromegaly present.  Cardiovascular: Normal rate, regular rhythm, normal heart sounds and intact distal pulses.  Exam reveals no gallop and no friction rub.   No murmur heard. Pulmonary/Chest: Effort normal and breath sounds normal. No respiratory distress. She has no wheezes. She has no rales. She exhibits no tenderness.  Musculoskeletal: Normal range of motion. She exhibits no edema or tenderness.  Lymphadenopathy:    She has no cervical adenopathy.  Neurological: She is alert and oriented to person, place, and time. No cranial nerve deficit. She exhibits normal muscle tone. Coordination normal.  Skin: Skin is warm and dry. No rash noted. She is not diaphoretic. No erythema. No pallor.  Psychiatric: She has a normal mood and affect. Her behavior is normal. Judgment and thought content normal.          Assessment & Plan:   Problem List Items Addressed This Visit      Unprioritized   Diastolic heart failure ([place]) - Primary    Symptoms of acute on chronic CHF improved. Euvolemic on exam today. Suspect exacerbation related to  viral URI.  Will recheck in 2 weeks and prn. Continue supplemental oxygen on exertion.      Relevant Medications   hydrALAZINE (APRESOLINE) 25 MG tablet   ESRD on hemodialysis ([place])    Continue HD as scheduled.       Essential (primary) hypertension    BP running lower with higher dose of Hydralazine. Will decrease dose to 25mg  tid. Follow up in 2 weeks and prn.      Relevant Medications   hydrALAZINE (APRESOLINE) 25 MG tablet       Return in about 2 years (around 05/09/2017) for Recheck.

## 2015-05-11 ENCOUNTER — Other Ambulatory Visit: Payer: Self-pay | Admitting: *Deleted

## 2015-05-11 ENCOUNTER — Other Ambulatory Visit: Payer: Self-pay | Admitting: Cardiovascular Disease

## 2015-05-11 DIAGNOSIS — I739 Peripheral vascular disease, unspecified: Secondary | ICD-10-CM | POA: Diagnosis not present

## 2015-05-11 DIAGNOSIS — I120 Hypertensive chronic kidney disease with stage 5 chronic kidney disease or end stage renal disease: Secondary | ICD-10-CM | POA: Diagnosis not present

## 2015-05-11 DIAGNOSIS — I214 Non-ST elevation (NSTEMI) myocardial infarction: Secondary | ICD-10-CM | POA: Diagnosis not present

## 2015-05-11 DIAGNOSIS — N186 End stage renal disease: Secondary | ICD-10-CM | POA: Diagnosis not present

## 2015-05-11 DIAGNOSIS — N2581 Secondary hyperparathyroidism of renal origin: Secondary | ICD-10-CM | POA: Diagnosis not present

## 2015-05-11 DIAGNOSIS — E119 Type 2 diabetes mellitus without complications: Secondary | ICD-10-CM | POA: Diagnosis not present

## 2015-05-11 DIAGNOSIS — Z992 Dependence on renal dialysis: Secondary | ICD-10-CM | POA: Diagnosis not present

## 2015-05-11 DIAGNOSIS — D509 Iron deficiency anemia, unspecified: Secondary | ICD-10-CM | POA: Diagnosis not present

## 2015-05-11 DIAGNOSIS — I25111 Atherosclerotic heart disease of native coronary artery with angina pectoris with documented spasm: Secondary | ICD-10-CM | POA: Diagnosis not present

## 2015-05-11 DIAGNOSIS — D631 Anemia in chronic kidney disease: Secondary | ICD-10-CM | POA: Diagnosis not present

## 2015-05-11 NOTE — Patient Outreach (Signed)
RNCM called pt as part of transition of care program. [facility] services explained and consent received verbally from pt. Transition of care flow-sheet completed. RNCM made an appointment to make a home visit with pt. Pt stated she did not have the money to buy a scale right now but knew it was important related to pt's HF. Pt stated she would like more information on caring for herself with HF.   Plan: RNCM will see pt on 12/8 in her home.  Rutherford Limerick RN, BSN  [facility] Care Management [phone])

## 2015-05-14 DIAGNOSIS — Z992 Dependence on renal dialysis: Secondary | ICD-10-CM | POA: Diagnosis not present

## 2015-05-14 DIAGNOSIS — N2581 Secondary hyperparathyroidism of renal origin: Secondary | ICD-10-CM | POA: Diagnosis not present

## 2015-05-14 DIAGNOSIS — D509 Iron deficiency anemia, unspecified: Secondary | ICD-10-CM | POA: Diagnosis not present

## 2015-05-14 DIAGNOSIS — N186 End stage renal disease: Secondary | ICD-10-CM | POA: Diagnosis not present

## 2015-05-14 DIAGNOSIS — D631 Anemia in chronic kidney disease: Secondary | ICD-10-CM | POA: Diagnosis not present

## 2015-05-15 DIAGNOSIS — I214 Non-ST elevation (NSTEMI) myocardial infarction: Secondary | ICD-10-CM | POA: Diagnosis not present

## 2015-05-15 DIAGNOSIS — E119 Type 2 diabetes mellitus without complications: Secondary | ICD-10-CM | POA: Diagnosis not present

## 2015-05-15 DIAGNOSIS — I25111 Atherosclerotic heart disease of native coronary artery with angina pectoris with documented spasm: Secondary | ICD-10-CM | POA: Diagnosis not present

## 2015-05-15 DIAGNOSIS — I120 Hypertensive chronic kidney disease with stage 5 chronic kidney disease or end stage renal disease: Secondary | ICD-10-CM | POA: Diagnosis not present

## 2015-05-15 DIAGNOSIS — I739 Peripheral vascular disease, unspecified: Secondary | ICD-10-CM | POA: Diagnosis not present

## 2015-05-15 DIAGNOSIS — N186 End stage renal disease: Secondary | ICD-10-CM | POA: Diagnosis not present

## 2015-05-15 NOTE — Assessment & Plan Note (Signed)
Continue HD as scheduled

## 2015-05-16 ENCOUNTER — Telehealth: Payer: Self-pay | Admitting: *Deleted

## 2015-05-16 DIAGNOSIS — D509 Iron deficiency anemia, unspecified: Secondary | ICD-10-CM | POA: Diagnosis not present

## 2015-05-16 DIAGNOSIS — N186 End stage renal disease: Secondary | ICD-10-CM | POA: Diagnosis not present

## 2015-05-16 DIAGNOSIS — D631 Anemia in chronic kidney disease: Secondary | ICD-10-CM | POA: Diagnosis not present

## 2015-05-16 DIAGNOSIS — Z992 Dependence on renal dialysis: Secondary | ICD-10-CM | POA: Diagnosis not present

## 2015-05-16 DIAGNOSIS — N2581 Secondary hyperparathyroidism of renal origin: Secondary | ICD-10-CM | POA: Diagnosis not present

## 2015-05-16 NOTE — Telephone Encounter (Signed)
Fernand Parkins from [employer] faxed over a form to be filled out for diabetic supplies for patient

## 2015-05-17 ENCOUNTER — Other Ambulatory Visit: Payer: Self-pay | Admitting: *Deleted

## 2015-05-17 ENCOUNTER — Encounter: Payer: Self-pay | Admitting: *Deleted

## 2015-05-17 DIAGNOSIS — I120 Hypertensive chronic kidney disease with stage 5 chronic kidney disease or end stage renal disease: Secondary | ICD-10-CM | POA: Diagnosis not present

## 2015-05-17 DIAGNOSIS — I214 Non-ST elevation (NSTEMI) myocardial infarction: Secondary | ICD-10-CM | POA: Diagnosis not present

## 2015-05-17 DIAGNOSIS — I25111 Atherosclerotic heart disease of native coronary artery with angina pectoris with documented spasm: Secondary | ICD-10-CM | POA: Diagnosis not present

## 2015-05-17 DIAGNOSIS — N186 End stage renal disease: Secondary | ICD-10-CM | POA: Diagnosis not present

## 2015-05-17 DIAGNOSIS — I739 Peripheral vascular disease, unspecified: Secondary | ICD-10-CM | POA: Diagnosis not present

## 2015-05-17 DIAGNOSIS — E119 Type 2 diabetes mellitus without complications: Secondary | ICD-10-CM | POA: Diagnosis not present

## 2015-05-17 NOTE — Telephone Encounter (Signed)
We do not complete these forms.

## 2015-05-17 NOTE — Patient Outreach (Addendum)
[facility]) Care Management   05/17/2015  IVETH HUTCHISON 1955/09/12 [id number]  Theresa [name] is an 59 y.o. female  Subjective: "I want to be able to walk around my block with out giving out of breath." "I would like to not have to use the 02 anymore, it is so cumbersome to carry around" "I want to learn as much as I can about HF so I don't have to go back to the hospital."   Objective:  Blood pressure 138/68, pulse 71, resp. rate 18, weight 169 lb (76.658 kg), SpO2 98 %. Review of Systems  Constitutional: Positive for malaise/fatigue.  Respiratory: Positive for cough. Negative for sputum production, shortness of breath and wheezing.   Cardiovascular: Positive for claudication.  Gastrointestinal: Positive for heartburn and diarrhea.  Musculoskeletal: Positive for back pain and falls.  Neurological: Positive for headaches.    Physical Exam  Constitutional: She is oriented to person, place, and time. She appears well-developed and well-nourished.  Cardiovascular: Normal rate and regular rhythm.   Murmur heard. Pulses:      Radial pulses are 2+ on the right side. Left radial pulse not accessible.       Dorsalis pedis pulses are 1+ on the right side, and 1+ on the left side.  Unable to feel left radial pulse related to dialysis access  Respiratory: Effort normal and breath sounds normal.  GI: Soft. Bowel sounds are normal.  Musculoskeletal:       Right ankle: She exhibits swelling. Tenderness.       Lumbar back: She exhibits tenderness.  Pt reports a fall earlier this year in which she badly sprained her ankle and it stays tender and tends to swell in the evening.  Neurological: She is alert and oriented to person, place, and time.  Skin: Skin is warm, dry and intact.  Psychiatric: She has a normal mood and affect. Her behavior is normal. Judgment normal. She exhibits abnormal recent memory.  Pt states she cant' spell like she used, has trouble getting out the word  she wants to say.     Current Medications:   Current Outpatient Prescriptions  Medication Sig Dispense Refill  . albuterol (PROVENTIL HFA;VENTOLIN HFA) 108 (90 BASE) MCG/ACT inhaler Inhale 2 puffs into the lungs every 6 (six) hours as needed for wheezing or shortness of breath.    Marland Kitchen albuterol (PROVENTIL) (2.5 MG/3ML) 0.083% nebulizer solution Take 2.5 mg by nebulization every 4 (four) hours as needed for wheezing or shortness of breath.    Marland Kitchen amLODipine (NORVASC) 10 MG tablet Take 1 tablet (10 mg total) by mouth daily. 30 tablet 0  . aspirin EC 81 MG tablet Take 81 mg by mouth daily.    . benzonatate (TESSALON) 200 MG capsule Take 1 capsule (200 mg total) by mouth 3 (three) times daily as needed for cough. 20 capsule 0  . calcium acetate (PHOSLO) 667 MG capsule Take 1,334 mg by mouth 2 (two) times daily with a meal.    . carvedilol (COREG) 12.5 MG tablet Take 1 tablet (12.5 mg total) by mouth 2 (two) times daily. 60 tablet 6  . cloNIDine (CATAPRES) 0.1 MG tablet Take 1 tablet (0.1 mg total) by mouth 2 (two) times daily. 60 tablet 11  . CRESTOR 40 MG tablet TAKE ONE TABLET BY MOUTH ONCE DAILY 90 tablet 3  . hydrALAZINE (APRESOLINE) 25 MG tablet Take 1 tablet (25 mg total) by mouth every 8 (eight) hours. 90 tablet 3  . Insulin Detemir (  LEVEMIR) 100 UNIT/ML Pen Inject 5 Units into the skin daily. 15 mL 3  . multivitamin (RENA-VIT) TABS tablet Take 1 tablet by mouth daily.    . pregabalin (LYRICA) 75 MG capsule Take 1 capsule (75 mg total) by mouth daily. 30 capsule 3  . levofloxacin (LEVAQUIN) 250 MG tablet Take 1 tablet (250 mg total) by mouth every other day. (Patient not taking: Reported on 05/17/2015) 5 tablet 0  . nitroGLYCERIN (NITROSTAT) 0.4 MG SL tablet Place 1 tablet (0.4 mg total) under the tongue every 5 (five) minutes as needed for chest pain. (Patient not taking: Reported on 05/17/2015) 25 tablet 3   No current facility-administered medications for this visit.    Functional Status:    In your present state of health, do you have any difficulty performing the following activities: 05/17/2015 05/07/2015  Hearing? - N  Vision? - N  Difficulty concentrating or making decisions? - N  Walking or climbing stairs? - N  Dressing or bathing? - N  Doing errands, shopping? - N  Conservation officer, nature and eating ? N -  Using the Toilet? N -  In the past six months, have you accidently leaked urine? N -  Do you have problems with loss of bowel control? Y -  Managing your Medications? N -  Managing your Finances? N -  Housekeeping or managing your Housekeeping? N -    Fall/Depression Screening:    PHQ 2/9 Scores 05/17/2015 09/02/2013 05/19/2012  PHQ - 2 Score 0 0 0   Fall Risk  05/17/2015 09/02/2013  Falls in the past year? Yes No  Number falls in past yr: 1 -  Injury with Fall? Yes -  Risk for fall due to : Impaired mobility;Impaired vision -  Follow up Falls prevention discussed -    Assessment: Pleasant  59y/o female pt. Home in good repair. Ambulating with steady gait. Reports independence with ADLs and IDLs. Pt stating she could use some bars in her bathroom and is agreeable for [facility] representative to request this of her land-lord. Pt stating she is weak and tired from long hospital stay and is wanting to get her strength back. Pt denies having problems affording medications and denies transportation issues. Pt reports she is being followed by home health at this time including nursing and PT.  HF: Lungs clear bilat. No edema noted to hands, feet or abd. Pt unsure of daily weights related to not having a scale yet but was going to ask her daughter to pick her one up on her way home. Pt stating she is still SOB from her baseline but is improved from the hospital. Denies loss of appetite, but c/o heart burn previous to hosp admission(but not since home) and chronic diarrhea. Does not take a diuretic related to on dialysis, but reports she does void twice a day.   Diabetes: Pt reports  diabetes is well controlled with current regimen. Last recorded A1c 5.5. Bilateral feet with skin intact.   COPD: Pt using 02 at night and with exertion. Conts to c/o intermittent cough without increased sputum production. Denies increase SOB. Using inhalers as prescribed.    Plan: RNCM will call pt next week as continued part of the transition of care program. RNCM will collaborate with [facility] SW in regards to obtaining bars in pt's bathroom.    Elyas Villamor RN, BSN  [facility] Care Management [phone]  [facility] CM Care Plan Problem One        Most Recent Value  Care Plan Problem One  Pt with potential for being readmitted to the hospital related to recent admission and knowledge deficit  of heart failure   Role Documenting the Problem One  Care Management [place] for Problem One  Active   [facility] Long Term Goal (31-90 days)  Pt will not be readmitted to the hospital with s/s of heart failure in the next 90 days   [facility] Long Term Goal Start Date  05/11/15   Interventions for Problem One Long Term Goal  Transition of care program initiated and home visit scheduled.   [facility] CM Short Term Goal #1 (0-30 days)  Pt will obtain a scale for daily weights and begin to weigh daily and record in the next 30 days   [facility] CM Short Term Goal #1 Start Date  05/17/15   Interventions for Short Term Goal #1  RNCM used HF packet to educate pt on the importance of daily weights and the significance of a 3 lb wt gain in one day and a 5lb weight gain in one week.   [facility] CM Short Term Goal #2 (0-30 days)  Pt will be able to name 4 s/s of worsening HF in the next 30 days.   [facility] CM Short Term Goal #2 Start Date  05/17/15   Interventions for Short Term Goal #2  RNCM provided pt with education on HF zones and went over zones to ensure understanding.

## 2015-05-18 DIAGNOSIS — D509 Iron deficiency anemia, unspecified: Secondary | ICD-10-CM | POA: Diagnosis not present

## 2015-05-18 DIAGNOSIS — D631 Anemia in chronic kidney disease: Secondary | ICD-10-CM | POA: Diagnosis not present

## 2015-05-18 DIAGNOSIS — Z992 Dependence on renal dialysis: Secondary | ICD-10-CM | POA: Diagnosis not present

## 2015-05-18 DIAGNOSIS — E119 Type 2 diabetes mellitus without complications: Secondary | ICD-10-CM | POA: Diagnosis not present

## 2015-05-18 DIAGNOSIS — I739 Peripheral vascular disease, unspecified: Secondary | ICD-10-CM | POA: Diagnosis not present

## 2015-05-18 DIAGNOSIS — N2581 Secondary hyperparathyroidism of renal origin: Secondary | ICD-10-CM | POA: Diagnosis not present

## 2015-05-18 DIAGNOSIS — I120 Hypertensive chronic kidney disease with stage 5 chronic kidney disease or end stage renal disease: Secondary | ICD-10-CM | POA: Diagnosis not present

## 2015-05-18 DIAGNOSIS — I25111 Atherosclerotic heart disease of native coronary artery with angina pectoris with documented spasm: Secondary | ICD-10-CM | POA: Diagnosis not present

## 2015-05-18 DIAGNOSIS — N186 End stage renal disease: Secondary | ICD-10-CM | POA: Diagnosis not present

## 2015-05-18 DIAGNOSIS — I214 Non-ST elevation (NSTEMI) myocardial infarction: Secondary | ICD-10-CM | POA: Diagnosis not present

## 2015-05-21 ENCOUNTER — Ambulatory Visit: Payer: Medicare Other | Admitting: Family

## 2015-05-21 DIAGNOSIS — Z992 Dependence on renal dialysis: Secondary | ICD-10-CM | POA: Diagnosis not present

## 2015-05-21 DIAGNOSIS — D509 Iron deficiency anemia, unspecified: Secondary | ICD-10-CM | POA: Diagnosis not present

## 2015-05-21 DIAGNOSIS — D631 Anemia in chronic kidney disease: Secondary | ICD-10-CM | POA: Diagnosis not present

## 2015-05-21 DIAGNOSIS — N186 End stage renal disease: Secondary | ICD-10-CM | POA: Diagnosis not present

## 2015-05-21 DIAGNOSIS — N2581 Secondary hyperparathyroidism of renal origin: Secondary | ICD-10-CM | POA: Diagnosis not present

## 2015-05-22 ENCOUNTER — Ambulatory Visit: Payer: Medicare Other | Attending: Family | Admitting: Family

## 2015-05-22 ENCOUNTER — Encounter: Payer: Self-pay | Admitting: Family

## 2015-05-22 VITALS — BP 127/57 | HR 72 | Resp 18 | Ht 67.0 in | Wt 173.0 lb

## 2015-05-22 DIAGNOSIS — I5032 Chronic diastolic (congestive) heart failure: Secondary | ICD-10-CM

## 2015-05-22 DIAGNOSIS — Z79899 Other long term (current) drug therapy: Secondary | ICD-10-CM | POA: Insufficient documentation

## 2015-05-22 DIAGNOSIS — Z9861 Coronary angioplasty status: Secondary | ICD-10-CM | POA: Insufficient documentation

## 2015-05-22 DIAGNOSIS — Z992 Dependence on renal dialysis: Secondary | ICD-10-CM | POA: Insufficient documentation

## 2015-05-22 DIAGNOSIS — I214 Non-ST elevation (NSTEMI) myocardial infarction: Secondary | ICD-10-CM | POA: Diagnosis not present

## 2015-05-22 DIAGNOSIS — Z794 Long term (current) use of insulin: Secondary | ICD-10-CM

## 2015-05-22 DIAGNOSIS — N186 End stage renal disease: Secondary | ICD-10-CM

## 2015-05-22 DIAGNOSIS — I739 Peripheral vascular disease, unspecified: Secondary | ICD-10-CM | POA: Insufficient documentation

## 2015-05-22 DIAGNOSIS — K5909 Other constipation: Secondary | ICD-10-CM | POA: Diagnosis not present

## 2015-05-22 DIAGNOSIS — E1122 Type 2 diabetes mellitus with diabetic chronic kidney disease: Secondary | ICD-10-CM

## 2015-05-22 DIAGNOSIS — I2510 Atherosclerotic heart disease of native coronary artery without angina pectoris: Secondary | ICD-10-CM | POA: Insufficient documentation

## 2015-05-22 DIAGNOSIS — Z87891 Personal history of nicotine dependence: Secondary | ICD-10-CM | POA: Insufficient documentation

## 2015-05-22 DIAGNOSIS — E785 Hyperlipidemia, unspecified: Secondary | ICD-10-CM | POA: Insufficient documentation

## 2015-05-22 DIAGNOSIS — E11319 Type 2 diabetes mellitus with unspecified diabetic retinopathy without macular edema: Secondary | ICD-10-CM | POA: Insufficient documentation

## 2015-05-22 DIAGNOSIS — H3500 Unspecified background retinopathy: Secondary | ICD-10-CM | POA: Diagnosis not present

## 2015-05-22 DIAGNOSIS — D638 Anemia in other chronic diseases classified elsewhere: Secondary | ICD-10-CM | POA: Insufficient documentation

## 2015-05-22 DIAGNOSIS — E1140 Type 2 diabetes mellitus with diabetic neuropathy, unspecified: Secondary | ICD-10-CM | POA: Insufficient documentation

## 2015-05-22 DIAGNOSIS — J439 Emphysema, unspecified: Secondary | ICD-10-CM | POA: Diagnosis not present

## 2015-05-22 DIAGNOSIS — Z7982 Long term (current) use of aspirin: Secondary | ICD-10-CM | POA: Insufficient documentation

## 2015-05-22 DIAGNOSIS — I10 Essential (primary) hypertension: Secondary | ICD-10-CM

## 2015-05-22 DIAGNOSIS — I120 Hypertensive chronic kidney disease with stage 5 chronic kidney disease or end stage renal disease: Secondary | ICD-10-CM | POA: Diagnosis not present

## 2015-05-22 DIAGNOSIS — I25111 Atherosclerotic heart disease of native coronary artery with angina pectoris with documented spasm: Secondary | ICD-10-CM | POA: Diagnosis not present

## 2015-05-22 DIAGNOSIS — J449 Chronic obstructive pulmonary disease, unspecified: Secondary | ICD-10-CM | POA: Diagnosis not present

## 2015-05-22 DIAGNOSIS — I509 Heart failure, unspecified: Secondary | ICD-10-CM | POA: Diagnosis present

## 2015-05-22 DIAGNOSIS — D573 Sickle-cell trait: Secondary | ICD-10-CM | POA: Insufficient documentation

## 2015-05-22 DIAGNOSIS — E119 Type 2 diabetes mellitus without complications: Secondary | ICD-10-CM | POA: Diagnosis not present

## 2015-05-22 DIAGNOSIS — I340 Nonrheumatic mitral (valve) insufficiency: Secondary | ICD-10-CM | POA: Diagnosis not present

## 2015-05-22 NOTE — Progress Notes (Signed)
Subjective:    Patient ID: Theresa [name], female    DOB: 1955-12-25, 59 y.o.   MRN: [id number]  Congestive Heart Failure Presents for initial visit. The disease course has been improving. Associated symptoms include fatigue (with dialysis). Pertinent negatives include no abdominal pain, chest pain, chest pressure, edema, orthopnea, palpitations or shortness of breath. The symptoms have been improving. Past treatments include beta blockers, salt and fluid restriction and oxygen. The treatment provided moderate relief. Compliance with prior treatments has been good. Her past medical history is significant for anemia, CAD, chronic lung disease, DM and HTN. She has multiple 1st degree relatives with heart disease. Compliance with total regimen is 76-100%.  Hypertension This is a chronic problem. The current episode started more than 1 year ago. The problem is controlled. Associated symptoms include headaches (when waking up with oxygen). Pertinent negatives include no chest pain, neck pain, palpitations, peripheral edema or shortness of breath. There are no associated agents to hypertension. Risk factors for coronary artery disease include diabetes mellitus. Past treatments include beta blockers, calcium channel blockers and lifestyle changes. The current treatment provides moderate improvement. There are no compliance problems.  Hypertensive end-organ damage includes kidney disease, CAD/MI and heart failure.    Past Medical History  Diagnosis Date  . Hypertension   . ESRD on hemodialysis ([place])     a. [facility] in [place], [place], on a TTS schedule.  She started dialysis in Feb 2014.  Etiology of renal failure not known, likely diabetes.  Has a left upper arm AV graft.  Marland Kitchen COPD (chronic obstructive pulmonary disease) ([place])   . Emphysema   . History of pneumonia     June 2012  . History of bronchitis     Mar 2012  . Peripheral vascular disease ([place])   . Chronic constipation   . Colon polyps   .  Sickle cell trait ([place])   . Anemia of chronic disease   . Diabetes mellitus   . Moderate mitral insufficiency     a. 10/2013 Echo: EF 55-60%, mod MR.  . Diabetic neuropathy ([place])   . Diabetic retinopathy ([place]) 05/28/2013    Hx bilat retinal detachment, proliferative diab retinopathy and bilat vitreous hemorrhage   . Hyperlipidemia   . Coronary artery disease     a. 05/2013 NSTEMI/PCI: LM 20d, LAD min irregs, LCX small, nl, OM1 nl, RCA dom 20m (2.5x16 Promus DES), PDA1 80p.  . Chronic diastolic CHF (congestive heart failure) ([place])     a. 10/2013 Echo [facility]): EF 55-60%, mod conc LVH, mod MR, mildly dil LA, mild Ao sclerosis w/o stenosis.  . Carotid arterial disease ([place])     a. 02/2013 U/S: 40-59% bilat ICA stenosis - *f/u 02/2014*  . History of tobacco abuse     a. Quit 2012.    Past Surgical History  Procedure Laterality Date  . Abdominal hysterectomy      2000  . Dilation and curettage of uterus      several in the early 80's  . Eye surgery      bilateral laser 2012  . Esophagogastroduodenoscopy      2012  . Colonscopy    . Tubal ligation      1979  . Pars plana vitrectomy  04/22/2011    Procedure: PARS PLANA VITRECTOMY WITH 25 GAUGE;  Surgeon: [name], MD;  Location: [place];  Service: Ophthalmology;  Laterality: Left;  membrane peel, endolaser, gas fluid exchange, silicone oil, repair of complex traction retinal  detachment  . Pars plana vitrectomy  09/30/2011    Procedure: PARS PLANA VITRECTOMY WITH 25 GAUGE;  Surgeon: [name], MD;  Location: [place];  Service: Ophthalmology;  Laterality: Right;  Endolaser; Repair of Complex Traction Retinal Detachment  . Gas/fluid exchange  09/30/2011    Procedure: GAS/FLUID EXCHANGE;  Surgeon: [name], MD;  Location: [place];  Service: Ophthalmology;  Laterality: Right;  . Gas insertion  09/30/2011    Procedure: INSERTION OF GAS;  Surgeon: [name], MD;  Location: [place];  Service: Ophthalmology;  Laterality: Right;  [id number]   . Eye surgery      right  . Pars plana vitrectomy  02/24/2012    Procedure: PARS PLANA VITRECTOMY WITH 25 GAUGE;  Surgeon: [name], MD;  Location: [place];  Service: Ophthalmology;  Laterality: Left;  . Silicon oil removal  [id number]    Procedure: SILICON OIL REMOVAL;  Surgeon: [name], MD;  Location: [place];  Service: Ophthalmology;  Laterality: Left;  . Ptca    . Thrombectomy / arteriovenous graft revision    . Cardiac catheterization    . Coronary angioplasty  05/28/2014    stent placement to the mid RCA  . Left heart catheterization with coronary angiogram N/A 05/28/2013    Procedure: LEFT HEART CATHETERIZATION WITH CORONARY ANGIOGRAM;  Surgeon: [name], MD;  Location: [facility] CATH LAB;  Service: Cardiovascular;  Laterality: N/A;  . Cardiac catheterization N/A 05/02/2015    Procedure: Left Heart Cath and Coronary Angiography;  Surgeon: [name], MD;  Location: [place] CV LAB;  Service: Cardiovascular;  Laterality: N/A;    Family History  Problem Relation Age of Onset  . Anesthesia problems Neg Hx   . Hypotension Neg Hx   . Malignant hyperthermia Neg Hx   . Pseudochol deficiency Neg Hx   . Stroke Mother   . Heart attack Mother   . Heart disease Mother   . Colon cancer Father   . Colon cancer Sister   . Heart attack Brother   . Stroke Brother   . Diabetes Brother   . Breast cancer Sister   . Diabetes Brother   . Diabetes Daughter     Social History  Substance Use Topics  . Smoking status: Former Smoker -- 1.00 packs/day for 25 years    Quit date: 06/09/2010  . Smokeless tobacco: Never Used  . Alcohol Use: No    Allergies  Allergen Reactions  . Metformin Diarrhea  . Hydrocodone Hives    Prior to Admission medications   Medication Sig Start Date End Date Taking? Authorizing Provider  albuterol (PROVENTIL HFA;VENTOLIN HFA) 108 (90 BASE) MCG/ACT inhaler Inhale 2 puffs into the lungs every 6 (six) hours as needed for wheezing or  shortness of breath.   Yes Historical Provider, MD  albuterol (PROVENTIL) (2.5 MG/3ML) 0.083% nebulizer solution Take 2.5 mg by nebulization every 4 (four) hours as needed for wheezing or shortness of breath.   Yes Historical Provider, MD  amLODipine (NORVASC) 10 MG tablet Take 1 tablet (10 mg total) by mouth daily. 05/04/15  Yes Bettey Costa, MD  aspirin EC 81 MG tablet Take 81 mg by mouth daily.   Yes Historical Provider, MD  calcium acetate (PHOSLO) 667 MG capsule Take 1,334 mg by mouth 2 (two) times daily with a meal.   Yes Historical Provider, MD  carvedilol (COREG) 12.5 MG tablet Take 1 tablet (12.5 mg total) by mouth 2 (two) times daily. 06/03/13  Yes Rhonda G Barrett, PA-C  cloNIDine (CATAPRES) 0.1 MG tablet Take 1 tablet (0.1 mg total) by mouth 2 (two) times daily. 01/30/15  Yes Minna Merritts, MD  CRESTOR 40 MG tablet TAKE ONE TABLET BY MOUTH ONCE DAILY 05/11/15  Yes Minna Merritts, MD  hydrALAZINE (APRESOLINE) 25 MG tablet Take 1 tablet (25 mg total) by mouth every 8 (eight) hours. 05/10/15  Yes Jackolyn Confer, MD  Insulin Detemir (LEVEMIR) 100 UNIT/ML Pen Inject 5 Units into the skin daily. 03/28/14  Yes Jackolyn Confer, MD  multivitamin (RENA-VIT) TABS tablet Take 1 tablet by mouth daily.   Yes Historical Provider, MD  nitroGLYCERIN (NITROSTAT) 0.4 MG SL tablet Place 1 tablet (0.4 mg total) under the tongue every 5 (five) minutes as needed for chest pain. 06/03/13  Yes Rhonda G Barrett, PA-C  pregabalin (LYRICA) 75 MG capsule Take 1 capsule (75 mg total) by mouth daily. 03/28/14  Yes Jackolyn Confer, MD     Review of Systems  Constitutional: Positive for fatigue (with dialysis). Negative for appetite change.  HENT: Negative for congestion, postnasal drip and sore throat.   Eyes: Negative for pain and visual disturbance.  Respiratory: Positive for cough (little bit). Negative for chest tightness and shortness of breath.   Cardiovascular: Negative for chest pain, palpitations  and leg swelling.  Gastrointestinal: Negative for abdominal pain and abdominal distention.  Endocrine: Negative.   Genitourinary: Negative.   Musculoskeletal: Positive for back pain (chronic). Negative for neck pain.  Skin: Negative.   Allergic/Immunologic: Negative.   Neurological: Positive for numbness (down right leg) and headaches (when waking up with oxygen). Negative for dizziness and light-headedness.  Hematological: Negative for adenopathy. Bruises/bleeds easily.  Psychiatric/Behavioral: Negative for sleep disturbance (sleeping on 1 pillow) and dysphoric mood. The patient is not nervous/anxious.        Objective:   Physical Exam  Constitutional: She is oriented to person, place, and time. She appears well-developed and well-nourished.  HENT:  Head: Normocephalic and atraumatic.  Eyes: Conjunctivae are normal. Pupils are equal, round, and reactive to light.  Neck: Normal range of motion. Neck supple.  Cardiovascular: Normal rate and regular rhythm.   Pulmonary/Chest: Effort normal. She has no wheezes. She has no rales.  Abdominal: Soft. She exhibits no distension. There is no tenderness.  Musculoskeletal: She exhibits no edema or tenderness.  Neurological: She is alert and oriented to person, place, and time.  Skin: Skin is warm and dry.  Psychiatric: She has a normal mood and affect. Her behavior is normal. Thought content normal.  Nursing note and vitals reviewed.   BP 127/57 mmHg  Pulse 72  Resp 18  Ht 5\' 7"  (1.702 m)  Wt 173 lb (78.472 kg)  BMI 27.09 kg/m2  SpO2 98%       Assessment & Plan:  1: Chronic heart failure with preserved ejection fraction- Patient presents with minimal symptoms. She says that she does get tired but it's usually on dialysis days or when she has physical therapy. She does say that her fatigue is much better from when she was discharged from the hospital. She is not currently weighing herself because she doesn't have any scales. A set of  scales was given to her today. She was instructed to weigh herself daily and to call for an overnight weight gain of >2 pounds or a weekly weight gain of >5 pounds. Also discussed that we may need to compare non-dialysis day weight versus dialysis day weights. She does  add "some" salt to her food but says that she "sprinkles" it on her food. She is already reading food labels. Discussed the importance of following a 2000mg  sodium diet and written information was given to her about this. A low sodium cookbook was also given to her. [facility] PharmD went in and reviewed medications with the patient. She has already received her flu vaccine this season.  2: HTN- Blood pressure looks good today. Continue medications at this time. 3: Diabetes- She says that she hasn't gotten a glucometer yet but when she got it checked at dialysis yesterday, it was 102. 4: ESRD on hemodialysis- Currently receiving dialysis on M, W, F.   Return in 1 month or sooner for any questions/problems before then.

## 2015-05-22 NOTE — Patient Instructions (Signed)
Begin weighing daily and call for an overnight weight gain of > 2 pounds or a weekly weight gain of >5 pounds. 

## 2015-05-23 DIAGNOSIS — N186 End stage renal disease: Secondary | ICD-10-CM | POA: Diagnosis not present

## 2015-05-23 DIAGNOSIS — D509 Iron deficiency anemia, unspecified: Secondary | ICD-10-CM | POA: Diagnosis not present

## 2015-05-23 DIAGNOSIS — D631 Anemia in chronic kidney disease: Secondary | ICD-10-CM | POA: Diagnosis not present

## 2015-05-23 DIAGNOSIS — N2581 Secondary hyperparathyroidism of renal origin: Secondary | ICD-10-CM | POA: Diagnosis not present

## 2015-05-23 DIAGNOSIS — Z992 Dependence on renal dialysis: Secondary | ICD-10-CM | POA: Diagnosis not present

## 2015-05-24 ENCOUNTER — Other Ambulatory Visit: Payer: Self-pay | Admitting: *Deleted

## 2015-05-24 DIAGNOSIS — I739 Peripheral vascular disease, unspecified: Secondary | ICD-10-CM | POA: Diagnosis not present

## 2015-05-24 DIAGNOSIS — I25111 Atherosclerotic heart disease of native coronary artery with angina pectoris with documented spasm: Secondary | ICD-10-CM | POA: Diagnosis not present

## 2015-05-24 DIAGNOSIS — I120 Hypertensive chronic kidney disease with stage 5 chronic kidney disease or end stage renal disease: Secondary | ICD-10-CM | POA: Diagnosis not present

## 2015-05-24 DIAGNOSIS — E119 Type 2 diabetes mellitus without complications: Secondary | ICD-10-CM | POA: Diagnosis not present

## 2015-05-24 DIAGNOSIS — I214 Non-ST elevation (NSTEMI) myocardial infarction: Secondary | ICD-10-CM | POA: Diagnosis not present

## 2015-05-24 DIAGNOSIS — N186 End stage renal disease: Secondary | ICD-10-CM | POA: Diagnosis not present

## 2015-05-24 NOTE — Patient Outreach (Signed)
RNCM received a call back from pt. Pt stating she had obtained a scale from the HF clinic and was starting to weigh daily as instructed. Pt stated she was still waiting on glucometer, but had been contacted by diabetes coordinator following up about this. Pt stated she was doing well with PT and could tell her strength was improving and her SOB was less. Pt f/u with nephrologist who did order for her to take lasix qday and added humidification to 02 to assist with headaches. Pt stating she is feeling better daily and denies HF s/s at this time. Incentive spirometer dropped off for pt by RNCM earlier this week to assist pt with lung function related to pt would like to be able to come off O2. Pt confirms getting IS and has started using it.    Plan: RNCM will call pt next week as continued part of the transition of care program.  Rutherford Limerick RN, BSN  [facility] Care Management [phone]

## 2015-05-24 NOTE — Patient Outreach (Signed)
Unsuccessful outreach for [facility] outreach for transition of care. HIPAA compliant message left.   Plan: RNCM will await return call  RNCM will attempt outreach again in 2 days.  Rutherford Limerick RN, BSN  [facility] Care Management [phone])

## 2015-05-25 DIAGNOSIS — D631 Anemia in chronic kidney disease: Secondary | ICD-10-CM | POA: Diagnosis not present

## 2015-05-25 DIAGNOSIS — Z992 Dependence on renal dialysis: Secondary | ICD-10-CM | POA: Diagnosis not present

## 2015-05-25 DIAGNOSIS — N186 End stage renal disease: Secondary | ICD-10-CM | POA: Diagnosis not present

## 2015-05-25 DIAGNOSIS — N2581 Secondary hyperparathyroidism of renal origin: Secondary | ICD-10-CM | POA: Diagnosis not present

## 2015-05-25 DIAGNOSIS — D509 Iron deficiency anemia, unspecified: Secondary | ICD-10-CM | POA: Diagnosis not present

## 2015-05-28 DIAGNOSIS — D509 Iron deficiency anemia, unspecified: Secondary | ICD-10-CM | POA: Diagnosis not present

## 2015-05-28 DIAGNOSIS — D631 Anemia in chronic kidney disease: Secondary | ICD-10-CM | POA: Diagnosis not present

## 2015-05-28 DIAGNOSIS — N186 End stage renal disease: Secondary | ICD-10-CM | POA: Diagnosis not present

## 2015-05-28 DIAGNOSIS — N2581 Secondary hyperparathyroidism of renal origin: Secondary | ICD-10-CM | POA: Diagnosis not present

## 2015-05-28 DIAGNOSIS — Z992 Dependence on renal dialysis: Secondary | ICD-10-CM | POA: Diagnosis not present

## 2015-05-29 ENCOUNTER — Ambulatory Visit: Payer: Medicare Other | Admitting: Internal Medicine

## 2015-05-29 ENCOUNTER — Telehealth: Payer: Self-pay | Admitting: Internal Medicine

## 2015-05-29 NOTE — Telephone Encounter (Signed)
Caller name: Bernyce Punches Relationship to patient: patient Can be reached:[phone]  Reason for call: pt called to state that she is unable to make her appt due to being sick. Pt wanted to know if she can reschedule the appt. Please advise scheduler to reschedule appt.

## 2015-05-29 NOTE — Telephone Encounter (Signed)
FYI: Patient will not be at her 2pm appointment.

## 2015-05-30 DIAGNOSIS — D509 Iron deficiency anemia, unspecified: Secondary | ICD-10-CM | POA: Diagnosis not present

## 2015-05-30 DIAGNOSIS — Z992 Dependence on renal dialysis: Secondary | ICD-10-CM | POA: Diagnosis not present

## 2015-05-30 DIAGNOSIS — N2581 Secondary hyperparathyroidism of renal origin: Secondary | ICD-10-CM | POA: Diagnosis not present

## 2015-05-30 DIAGNOSIS — D631 Anemia in chronic kidney disease: Secondary | ICD-10-CM | POA: Diagnosis not present

## 2015-05-30 DIAGNOSIS — N186 End stage renal disease: Secondary | ICD-10-CM | POA: Diagnosis not present

## 2015-05-31 ENCOUNTER — Other Ambulatory Visit: Payer: Self-pay | Admitting: *Deleted

## 2015-05-31 NOTE — Patient Outreach (Signed)
RNCM called pt as part of the transition of care program. Pt noted to sound stuffy and stated she had been feeling sick for a few days. Pt denies fever, discolored mucus, increased cough or SOB. Pt did say she was feeling very tired. Pt denied seeing her MD for present illness. RNCM discussed with pt the s/s of infection and reasons to call the MD. Pt stated her [facility] PT was coming today and would be taking her vs. Pt denied using scale given to her by the HF clinic to do daily weights related to feeling bad with her cold. Pt denies edema to feet, hands or abdomen. Pt continuing to use 02 at night and has been able to go to dialysis per her normal schedule. RNCM requested pt use her incentive spirometer. Pt verbalized understanding. Pt stating she is continuing to have a headache and stated she was unable to to use the humidity ordered for her 02 related to when she hooked it up it blew water into the tubing and into her nose. RNCM suggested pt ask PT to look at 02 set up to see if they were able to trouble shoot why the water did this. Pt verbalized understanding. Pt denied needs at this time.   Plan: RNCM will call pt next week as continued part of the transition of care program.  Rutherford Limerick RN, BSN  [facility] Care Management [phone]

## 2015-06-01 DIAGNOSIS — I214 Non-ST elevation (NSTEMI) myocardial infarction: Secondary | ICD-10-CM | POA: Diagnosis not present

## 2015-06-01 DIAGNOSIS — D509 Iron deficiency anemia, unspecified: Secondary | ICD-10-CM | POA: Diagnosis not present

## 2015-06-01 DIAGNOSIS — I739 Peripheral vascular disease, unspecified: Secondary | ICD-10-CM | POA: Diagnosis not present

## 2015-06-01 DIAGNOSIS — D631 Anemia in chronic kidney disease: Secondary | ICD-10-CM | POA: Diagnosis not present

## 2015-06-01 DIAGNOSIS — E119 Type 2 diabetes mellitus without complications: Secondary | ICD-10-CM | POA: Diagnosis not present

## 2015-06-01 DIAGNOSIS — Z992 Dependence on renal dialysis: Secondary | ICD-10-CM | POA: Diagnosis not present

## 2015-06-01 DIAGNOSIS — I25111 Atherosclerotic heart disease of native coronary artery with angina pectoris with documented spasm: Secondary | ICD-10-CM | POA: Diagnosis not present

## 2015-06-01 DIAGNOSIS — I120 Hypertensive chronic kidney disease with stage 5 chronic kidney disease or end stage renal disease: Secondary | ICD-10-CM | POA: Diagnosis not present

## 2015-06-01 DIAGNOSIS — N186 End stage renal disease: Secondary | ICD-10-CM | POA: Diagnosis not present

## 2015-06-01 DIAGNOSIS — N2581 Secondary hyperparathyroidism of renal origin: Secondary | ICD-10-CM | POA: Diagnosis not present

## 2015-06-04 DIAGNOSIS — D509 Iron deficiency anemia, unspecified: Secondary | ICD-10-CM | POA: Diagnosis not present

## 2015-06-04 DIAGNOSIS — N186 End stage renal disease: Secondary | ICD-10-CM | POA: Diagnosis not present

## 2015-06-04 DIAGNOSIS — Z992 Dependence on renal dialysis: Secondary | ICD-10-CM | POA: Diagnosis not present

## 2015-06-04 DIAGNOSIS — D631 Anemia in chronic kidney disease: Secondary | ICD-10-CM | POA: Diagnosis not present

## 2015-06-04 DIAGNOSIS — N2581 Secondary hyperparathyroidism of renal origin: Secondary | ICD-10-CM | POA: Diagnosis not present

## 2015-06-06 DIAGNOSIS — D631 Anemia in chronic kidney disease: Secondary | ICD-10-CM | POA: Diagnosis not present

## 2015-06-06 DIAGNOSIS — Z992 Dependence on renal dialysis: Secondary | ICD-10-CM | POA: Diagnosis not present

## 2015-06-06 DIAGNOSIS — N2581 Secondary hyperparathyroidism of renal origin: Secondary | ICD-10-CM | POA: Diagnosis not present

## 2015-06-06 DIAGNOSIS — N186 End stage renal disease: Secondary | ICD-10-CM | POA: Diagnosis not present

## 2015-06-06 DIAGNOSIS — D509 Iron deficiency anemia, unspecified: Secondary | ICD-10-CM | POA: Diagnosis not present

## 2015-06-07 DIAGNOSIS — I25111 Atherosclerotic heart disease of native coronary artery with angina pectoris with documented spasm: Secondary | ICD-10-CM | POA: Diagnosis not present

## 2015-06-07 DIAGNOSIS — I214 Non-ST elevation (NSTEMI) myocardial infarction: Secondary | ICD-10-CM | POA: Diagnosis not present

## 2015-06-07 DIAGNOSIS — N186 End stage renal disease: Secondary | ICD-10-CM | POA: Diagnosis not present

## 2015-06-07 DIAGNOSIS — E119 Type 2 diabetes mellitus without complications: Secondary | ICD-10-CM | POA: Diagnosis not present

## 2015-06-07 DIAGNOSIS — I120 Hypertensive chronic kidney disease with stage 5 chronic kidney disease or end stage renal disease: Secondary | ICD-10-CM | POA: Diagnosis not present

## 2015-06-07 DIAGNOSIS — I739 Peripheral vascular disease, unspecified: Secondary | ICD-10-CM | POA: Diagnosis not present

## 2015-06-08 DIAGNOSIS — D631 Anemia in chronic kidney disease: Secondary | ICD-10-CM | POA: Diagnosis not present

## 2015-06-08 DIAGNOSIS — N186 End stage renal disease: Secondary | ICD-10-CM | POA: Diagnosis not present

## 2015-06-08 DIAGNOSIS — D509 Iron deficiency anemia, unspecified: Secondary | ICD-10-CM | POA: Diagnosis not present

## 2015-06-08 DIAGNOSIS — N2581 Secondary hyperparathyroidism of renal origin: Secondary | ICD-10-CM | POA: Diagnosis not present

## 2015-06-08 DIAGNOSIS — Z992 Dependence on renal dialysis: Secondary | ICD-10-CM | POA: Diagnosis not present

## 2015-06-10 DIAGNOSIS — N186 End stage renal disease: Secondary | ICD-10-CM | POA: Diagnosis not present

## 2015-06-10 DIAGNOSIS — Z992 Dependence on renal dialysis: Secondary | ICD-10-CM | POA: Diagnosis not present

## 2015-06-11 DIAGNOSIS — Z992 Dependence on renal dialysis: Secondary | ICD-10-CM | POA: Diagnosis not present

## 2015-06-11 DIAGNOSIS — Z23 Encounter for immunization: Secondary | ICD-10-CM | POA: Diagnosis not present

## 2015-06-11 DIAGNOSIS — D631 Anemia in chronic kidney disease: Secondary | ICD-10-CM | POA: Diagnosis not present

## 2015-06-11 DIAGNOSIS — N186 End stage renal disease: Secondary | ICD-10-CM | POA: Diagnosis not present

## 2015-06-11 DIAGNOSIS — N2581 Secondary hyperparathyroidism of renal origin: Secondary | ICD-10-CM | POA: Diagnosis not present

## 2015-06-11 DIAGNOSIS — D509 Iron deficiency anemia, unspecified: Secondary | ICD-10-CM | POA: Diagnosis not present

## 2015-06-12 ENCOUNTER — Encounter: Payer: Self-pay | Admitting: Emergency Medicine

## 2015-06-12 ENCOUNTER — Emergency Department
Admission: EM | Admit: 2015-06-12 | Discharge: 2015-06-12 | Disposition: A | Discharge status: Home / Self Care | Payer: Medicare Other | Attending: Emergency Medicine | Admitting: Emergency Medicine

## 2015-06-12 DIAGNOSIS — I214 Non-ST elevation (NSTEMI) myocardial infarction: Secondary | ICD-10-CM | POA: Diagnosis not present

## 2015-06-12 DIAGNOSIS — E113543 Type 2 diabetes mellitus with proliferative diabetic retinopathy with combined traction retinal detachment and rhegmatogenous retinal detachment, bilateral: Secondary | ICD-10-CM | POA: Diagnosis not present

## 2015-06-12 DIAGNOSIS — Z7982 Long term (current) use of aspirin: Secondary | ICD-10-CM | POA: Insufficient documentation

## 2015-06-12 DIAGNOSIS — K0889 Other specified disorders of teeth and supporting structures: Secondary | ICD-10-CM | POA: Diagnosis present

## 2015-06-12 DIAGNOSIS — I25111 Atherosclerotic heart disease of native coronary artery with angina pectoris with documented spasm: Secondary | ICD-10-CM | POA: Diagnosis not present

## 2015-06-12 DIAGNOSIS — Z7984 Long term (current) use of oral hypoglycemic drugs: Secondary | ICD-10-CM | POA: Diagnosis not present

## 2015-06-12 DIAGNOSIS — E119 Type 2 diabetes mellitus without complications: Secondary | ICD-10-CM | POA: Diagnosis not present

## 2015-06-12 DIAGNOSIS — I120 Hypertensive chronic kidney disease with stage 5 chronic kidney disease or end stage renal disease: Secondary | ICD-10-CM | POA: Insufficient documentation

## 2015-06-12 DIAGNOSIS — K047 Periapical abscess without sinus: Secondary | ICD-10-CM

## 2015-06-12 DIAGNOSIS — Z992 Dependence on renal dialysis: Secondary | ICD-10-CM | POA: Insufficient documentation

## 2015-06-12 DIAGNOSIS — Z87891 Personal history of nicotine dependence: Secondary | ICD-10-CM | POA: Insufficient documentation

## 2015-06-12 DIAGNOSIS — Z79899 Other long term (current) drug therapy: Secondary | ICD-10-CM | POA: Insufficient documentation

## 2015-06-12 DIAGNOSIS — N186 End stage renal disease: Secondary | ICD-10-CM | POA: Insufficient documentation

## 2015-06-12 DIAGNOSIS — K029 Dental caries, unspecified: Secondary | ICD-10-CM | POA: Diagnosis not present

## 2015-06-12 DIAGNOSIS — I739 Peripheral vascular disease, unspecified: Secondary | ICD-10-CM | POA: Diagnosis not present

## 2015-06-12 DIAGNOSIS — E1140 Type 2 diabetes mellitus with diabetic neuropathy, unspecified: Secondary | ICD-10-CM | POA: Insufficient documentation

## 2015-06-12 MED ORDER — LIDOCAINE HCL (PF) 1 % IJ SOLN
2.0000 mL | Freq: Once | INTRAMUSCULAR | Status: AC
  Administered 2015-06-12: 2 mL
  Filled 2015-06-12: qty 5

## 2015-06-12 MED ORDER — OXYCODONE-ACETAMINOPHEN 5-325 MG PO TABS: 1.0000 | ORAL_TABLET | Freq: Four times a day (QID) | ORAL | Status: DC | PRN

## 2015-06-12 MED ORDER — PENICILLIN V POTASSIUM 500 MG PO TABS: 500.0000 mg | ORAL_TABLET | Freq: Four times a day (QID) | ORAL | Status: DC

## 2015-06-12 MED ORDER — BUPIVACAINE HCL 0.5 % IJ SOLN
5.0000 mL | Freq: Once | INTRAMUSCULAR | Status: AC
  Administered 2015-06-12: 5 mL

## 2015-06-12 MED ORDER — BUPIVACAINE HCL (PF) 0.5 % IJ SOLN
INTRAMUSCULAR | Status: AC
  Administered 2015-06-12: 5 mL
  Filled 2015-06-12: qty 30

## 2015-06-12 NOTE — Discharge Instructions (Signed)
Dental Abscess °A dental abscess is a collection of pus in or around a tooth. °CAUSES °This condition is caused by a bacterial infection around the root of the tooth that involves the inner part of the tooth (pulp). It may result from: °· Severe tooth decay. °· Trauma to the tooth that allows bacteria to enter into the pulp, such as a broken or chipped tooth. °· Severe gum disease around a tooth. °SYMPTOMS °Symptoms of this condition include: °· Severe pain in and around the infected tooth. °· Swelling and redness around the infected tooth, in the mouth, or in the face. °· Tenderness. °· Pus drainage. °· Bad breath. °· Bitter taste in the mouth. °· Difficulty swallowing. °· Difficulty opening the mouth. °· Nausea. °· Vomiting. °· Chills. °· Swollen neck glands. °· Fever. °DIAGNOSIS °This condition is diagnosed with examination of the infected tooth. During the exam, your dentist may tap on the infected tooth. Your dentist will also ask about your medical and dental history and may order X-rays. °TREATMENT °This condition is treated by eliminating the infection. This may be done with: °· Antibiotic medicine. °· A root canal. This may be performed to save the tooth. °· Pulling (extracting) the tooth. This may also involve draining the abscess. This is done if the tooth cannot be saved. °HOME CARE INSTRUCTIONS °· Take medicines only as directed by your dentist. °· If you were prescribed antibiotic medicine, finish all of it even if you start to feel better. °· Rinse your mouth (gargle) often with salt water to relieve pain or swelling. °· Do not drive or operate heavy machinery while taking pain medicine. °· Do not apply heat to the outside of your mouth. °· Keep all follow-up visits as directed by your dentist. This is important. °SEEK MEDICAL CARE IF: °· Your pain is worse and is not helped by medicine. °SEEK IMMEDIATE MEDICAL CARE IF: °· You have a fever or chills. °· Your symptoms suddenly get worse. °· You have a  very bad headache. °· You have problems breathing or swallowing. °· You have trouble opening your mouth. °· You have swelling in your neck or around your eye. °  °This information is not intended to replace advice given to you by your health care provider. Make sure you discuss any questions you have with your health care provider. °  °Document Released: 05/26/2005 Document Revised: 10/10/2014 Document Reviewed: 05/23/2014 °Elsevier Interactive Patient Education ©2016 [employer] ° °

## 2015-06-12 NOTE — ED Notes (Signed)
Pt reports to ED w/ c/o high BP and dental pain.  Pt sts that pain started 06/08/15 and that she has not been able to see dentist.  Pt reports 10/10 pain in L lower teeth.  Noticeable swelling to pts L cheek.

## 2015-06-12 NOTE — ED Notes (Signed)
Has abscess tooth to left side   Min swelling

## 2015-06-12 NOTE — ED Provider Notes (Signed)
CSN: [id number]     Arrival date & time 06/12/15  1656 History   First MD Initiated Contact with Patient 06/12/15 1740     Chief Complaint  Patient presents with  . Dental Pain     (Consider location/radiation/quality/duration/timing/severity/associated sxs/prior Treatment) HPI  60 year old female presents to emergency department for evaluation of dental pain, swelling. Symptoms have been present for 3 days. She denies any fevers, purulent drainage. She has had left back lower tooth pain and swelling. Pain is 10 out of 10. She describes it as a constant throbbing sensation that is increased with movement, drinking and eating. She has had Tylenol with no relief. She does not have a dentist. She denies any headaches or neck pain.  Past Medical History  Diagnosis Date  . Hypertension   . ESRD on hemodialysis ([place])     a. [facility] in [place], [place], on a TTS schedule.  She started dialysis in Feb 2014.  Etiology of renal failure not known, likely diabetes.  Has a left upper arm AV graft.  Marland Kitchen COPD (chronic obstructive pulmonary disease) ([place])   . Emphysema   . History of pneumonia     June 2012  . History of bronchitis     Mar 2012  . Peripheral vascular disease ([place])   . Chronic constipation   . Colon polyps   . Sickle cell trait ([place])   . Anemia of chronic disease   . Diabetes mellitus   . Moderate mitral insufficiency     a. 10/2013 Echo: EF 55-60%, mod MR.  . Diabetic neuropathy ([place])   . Diabetic retinopathy ([place]) 05/28/2013    Hx bilat retinal detachment, proliferative diab retinopathy and bilat vitreous hemorrhage   . Hyperlipidemia   . Coronary artery disease     a. 05/2013 NSTEMI/PCI: LM 20d, LAD min irregs, LCX small, nl, OM1 nl, RCA dom 21m (2.5x16 Promus DES), PDA1 80p.  . Chronic diastolic CHF (congestive heart failure) ([place])     a. 10/2013 Echo [facility]): EF 55-60%, mod conc LVH, mod MR, mildly dil LA, mild Ao sclerosis w/o stenosis.  . Carotid arterial disease ([place])     a.  02/2013 U/S: 40-59% bilat ICA stenosis - *f/u 02/2014*  . History of tobacco abuse     a. Quit 2012.   Past Surgical History  Procedure Laterality Date  . Abdominal hysterectomy      2000  . Dilation and curettage of uterus      several in the early 80's  . Eye surgery      bilateral laser 2012  . Esophagogastroduodenoscopy      2012  . Colonscopy    . Tubal ligation      1979  . Pars plana vitrectomy  04/22/2011    Procedure: PARS PLANA VITRECTOMY WITH 25 GAUGE;  Surgeon: [name], MD;  Location: [facility];  Service: Ophthalmology;  Laterality: Left;  membrane peel, endolaser, gas fluid exchange, silicone oil, repair of complex traction retinal detachment  . Pars plana vitrectomy  09/30/2011    Procedure: PARS PLANA VITRECTOMY WITH 25 GAUGE;  Surgeon: [name], MD;  Location: [facility];  Service: Ophthalmology;  Laterality: Right;  Endolaser; Repair of Complex Traction Retinal Detachment  . Gas/fluid exchange  09/30/2011    Procedure: GAS/FLUID EXCHANGE;  Surgeon: [name], MD;  Location: [facility];  Service: Ophthalmology;  Laterality: Right;  . Gas insertion  09/30/2011    Procedure: INSERTION OF GAS;  Surgeon: [name],  MD;  Location: [facility];  Service: Ophthalmology;  Laterality: Right;  [id number]  . Eye surgery      right  . Pars plana vitrectomy  02/24/2012    Procedure: PARS PLANA VITRECTOMY WITH 25 GAUGE;  Surgeon: [name], MD;  Location: [facility];  Service: Ophthalmology;  Laterality: Left;  . Silicon oil removal  [id number]    Procedure: SILICON OIL REMOVAL;  Surgeon: [name], MD;  Location: [facility];  Service: Ophthalmology;  Laterality: Left;  . Ptca    . Thrombectomy / arteriovenous graft revision    . Cardiac catheterization    . Coronary angioplasty  05/28/2014    stent placement to the mid RCA  . Left heart catheterization with coronary angiogram N/A 05/28/2013    Procedure: LEFT HEART CATHETERIZATION WITH CORONARY ANGIOGRAM;  Surgeon: [name], MD;  Location: [facility] CATH LAB;  Service: Cardiovascular;  Laterality: N/A;  . Cardiac catheterization N/A 05/02/2015    Procedure: Left Heart Cath and Coronary Angiography;  Surgeon: [name], MD;  Location: [facility] CV LAB;  Service: Cardiovascular;  Laterality: N/A;   Family History  Problem Relation Age of Onset  . Anesthesia problems Neg Hx   . Hypotension Neg Hx   . Malignant hyperthermia Neg Hx   . Pseudochol deficiency Neg Hx   . Stroke Mother   . Heart attack Mother   . Heart disease Mother   . Colon cancer Father   . Colon cancer Sister   . Heart attack Brother   . Stroke Brother   . Diabetes Brother   . Breast cancer Sister   . Diabetes Brother   . Diabetes Daughter    Social History  Substance Use Topics  . Smoking status: Former Smoker -- 1.00 packs/day for 25 years    Quit date: 06/09/2010  . Smokeless tobacco: Never Used  . Alcohol Use: No   OB History    No data available     Review of Systems  Constitutional: Negative.  Negative for fever and chills.  HENT: Positive for dental problem and facial swelling. Negative for drooling, mouth sores, trouble swallowing and voice change.   Respiratory: Negative for chest tightness and shortness of breath.   Cardiovascular: Negative for chest pain.  Gastrointestinal: Negative for nausea, vomiting, abdominal pain and diarrhea.  Musculoskeletal: Negative for arthralgias, neck pain and neck stiffness.  Skin: Negative.   Psychiatric/Behavioral: Negative for confusion.  All other systems reviewed and are negative.     Allergies  Metformin and Hydrocodone  Home Medications   Prior to Admission medications   Medication Sig Start Date End Date Taking? Authorizing Provider  albuterol (PROVENTIL HFA;VENTOLIN HFA) 108 (90 BASE) MCG/ACT inhaler Inhale 2 puffs into the lungs every 6 (six) hours as needed for wheezing or shortness of breath.    Historical Provider, MD  albuterol (PROVENTIL) (2.5  MG/3ML) 0.083% nebulizer solution Take 2.5 mg by nebulization every 4 (four) hours as needed for wheezing or shortness of breath.    Historical Provider, MD  amLODipine (NORVASC) 10 MG tablet Take 1 tablet (10 mg total) by mouth daily. 05/04/15   Bettey Costa, MD  aspirin EC 81 MG tablet Take 81 mg by mouth daily.    Historical Provider, MD  calcium acetate (PHOSLO) 667 MG capsule Take 1,334 mg by mouth 2 (two) times daily with a meal.    Historical Provider, MD  carvedilol (COREG) 12.5 MG tablet Take 1 tablet (12.5 mg total) by  mouth 2 (two) times daily. 06/03/13   Rhonda G Barrett, PA-C  cloNIDine (CATAPRES) 0.1 MG tablet Take 1 tablet (0.1 mg total) by mouth 2 (two) times daily. 01/30/15   Minna Merritts, MD  CRESTOR 40 MG tablet TAKE ONE TABLET BY MOUTH ONCE DAILY 05/11/15   Minna Merritts, MD  hydrALAZINE (APRESOLINE) 25 MG tablet Take 1 tablet (25 mg total) by mouth every 8 (eight) hours. 05/10/15   Jackolyn Confer, MD  Insulin Detemir (LEVEMIR) 100 UNIT/ML Pen Inject 5 Units into the skin daily. 03/28/14   Jackolyn Confer, MD  multivitamin (RENA-VIT) TABS tablet Take 1 tablet by mouth daily.    Historical Provider, MD  nitroGLYCERIN (NITROSTAT) 0.4 MG SL tablet Place 1 tablet (0.4 mg total) under the tongue every 5 (five) minutes as needed for chest pain. 06/03/13   Rhonda G Barrett, PA-C  oxyCODONE-acetaminophen (ROXICET) 5-325 MG tablet Take 1 tablet by mouth every 6 (six) hours as needed for severe pain. 06/12/15   Duanne Guess, PA-C  penicillin v potassium (VEETID) 500 MG tablet Take 1 tablet (500 mg total) by mouth 4 (four) times daily. 06/12/15   Duanne Guess, PA-C  pregabalin (LYRICA) 75 MG capsule Take 1 capsule (75 mg total) by mouth daily. 03/28/14   Jackolyn Confer, MD   BP 195/66 mmHg  Pulse 81  Temp(Src) 99.1 F (37.3 C) (Oral)  Resp 18  SpO2 99% Physical Exam  Constitutional: She is oriented to person, place, and time. She appears well-developed and well-nourished.  No distress.  HENT:  Head: Normocephalic and atraumatic.  Right Ear: External ear normal.  Left Ear: External ear normal.  Nose: Nose normal.  Mouth/Throat: Uvula is midline and oropharynx is clear and moist. No oral lesions. No trismus in the jaw. Normal dentition. Dental abscesses and dental caries present. No uvula swelling.    Eyes: EOM are normal.  Neck: Normal range of motion. Neck supple.  Cardiovascular: Normal rate.  Exam reveals no gallop and no friction rub.   No murmur heard. Pulmonary/Chest: Effort normal and breath sounds normal. No respiratory distress.  Neurological: She is alert and oriented to person, place, and time.  Skin: Skin is warm and dry.  Psychiatric: She has a normal mood and affect. Her behavior is normal. Thought content normal.    ED Course  .Nerve Block Date/Time: 06/12/2015 5:53 PM Performed by: Duanne Guess Authorized by: Dorise Hiss C Consent: Verbal consent obtained. Written consent not obtained. Risks and benefits: risks, benefits and alternatives were discussed Consent given by: patient Patient understanding: patient states understanding of the procedure being performed Patient consent: the patient's understanding of the procedure matches consent given Procedure consent: procedure consent matches procedure scheduled Patient identity confirmed: verbally with patient and arm band Time out: Immediately prior to procedure a "time out" was called to verify the correct patient, procedure, equipment, support staff and site/side marked as required. Indications: pain relief Nerve block body site: Left mandibular nerve. Laterality: left Patient sedated: no Patient position: supine Needle gauge: 25 G Location technique: anatomical landmarks Local anesthetic: lidocaine 1% without epinephrine and bupivacaine 0.5% without epinephrine Anesthetic total: 10 ml Outcome: pain improved Patient tolerance: Patient tolerated the procedure well with no  immediate complications   (including critical care time) Labs Review Labs Reviewed - No data to display  Imaging Review No results found. I have personally reviewed and evaluated these images and lab results as part of my medical decision-making.  EKG Interpretation None      MDM   Final diagnoses:  Dental abscess    60 year old female with left dental abscess and pain. She agreed and consented to a left mandibular nerve block. She tolerated procedure well. She is given a prescription for oxycodone and penicillin VK. She will follow-up with [facility] dental clinic. Return to the ER for any worsening symptoms urgent changes in her health.    Duanne Guess, PA-C 06/12/15 [place], MD 06/13/15 [phone]

## 2015-06-13 ENCOUNTER — Other Ambulatory Visit: Payer: Self-pay | Admitting: *Deleted

## 2015-06-13 DIAGNOSIS — D509 Iron deficiency anemia, unspecified: Secondary | ICD-10-CM | POA: Diagnosis not present

## 2015-06-13 DIAGNOSIS — I509 Heart failure, unspecified: Secondary | ICD-10-CM

## 2015-06-13 DIAGNOSIS — N186 End stage renal disease: Secondary | ICD-10-CM | POA: Diagnosis not present

## 2015-06-13 DIAGNOSIS — N2581 Secondary hyperparathyroidism of renal origin: Secondary | ICD-10-CM | POA: Diagnosis not present

## 2015-06-13 DIAGNOSIS — Z992 Dependence on renal dialysis: Secondary | ICD-10-CM | POA: Diagnosis not present

## 2015-06-13 DIAGNOSIS — Z23 Encounter for immunization: Secondary | ICD-10-CM | POA: Diagnosis not present

## 2015-06-13 DIAGNOSIS — D631 Anemia in chronic kidney disease: Secondary | ICD-10-CM | POA: Diagnosis not present

## 2015-06-13 NOTE — Patient Outreach (Signed)
RNCM called pt to complete the transition of care program. Pt had been in the ED related to an abscessed tooth yesterday. Pt stated she was antibiotics and pain medications. She had received a nerve block in the ED which she stated was helpful. Pt denies SOB, edema, congestion or cough. Pt stated her weight was steady. Discussed with pt having a Health Coach call her to continue teaching on HF and pt was interested in this. Pt stated that she is trying to stay healthy enough to obtain a kidney transplant.   Plan: RNCM will transition pt to Health coach. RNCM will make MD aware.  Rutherford Limerick RN, BSN  [facility] Care Management [phone])

## 2015-06-14 DIAGNOSIS — I10 Essential (primary) hypertension: Secondary | ICD-10-CM | POA: Diagnosis not present

## 2015-06-14 DIAGNOSIS — E785 Hyperlipidemia, unspecified: Secondary | ICD-10-CM | POA: Diagnosis not present

## 2015-06-14 DIAGNOSIS — E119 Type 2 diabetes mellitus without complications: Secondary | ICD-10-CM | POA: Diagnosis not present

## 2015-06-14 DIAGNOSIS — T82318A Breakdown (mechanical) of other vascular grafts, initial encounter: Secondary | ICD-10-CM | POA: Diagnosis not present

## 2015-06-14 DIAGNOSIS — I739 Peripheral vascular disease, unspecified: Secondary | ICD-10-CM | POA: Diagnosis not present

## 2015-06-14 DIAGNOSIS — Y841 Kidney dialysis as the cause of abnormal reaction of the patient, or of later complication, without mention of misadventure at the time of the procedure: Secondary | ICD-10-CM | POA: Diagnosis not present

## 2015-06-14 DIAGNOSIS — N186 End stage renal disease: Secondary | ICD-10-CM | POA: Diagnosis not present

## 2015-06-14 DIAGNOSIS — Z992 Dependence on renal dialysis: Secondary | ICD-10-CM | POA: Diagnosis not present

## 2015-06-15 DIAGNOSIS — N2581 Secondary hyperparathyroidism of renal origin: Secondary | ICD-10-CM | POA: Diagnosis not present

## 2015-06-15 DIAGNOSIS — Z992 Dependence on renal dialysis: Secondary | ICD-10-CM | POA: Diagnosis not present

## 2015-06-15 DIAGNOSIS — N186 End stage renal disease: Secondary | ICD-10-CM | POA: Diagnosis not present

## 2015-06-15 DIAGNOSIS — D631 Anemia in chronic kidney disease: Secondary | ICD-10-CM | POA: Diagnosis not present

## 2015-06-15 DIAGNOSIS — D509 Iron deficiency anemia, unspecified: Secondary | ICD-10-CM | POA: Diagnosis not present

## 2015-06-15 DIAGNOSIS — Z23 Encounter for immunization: Secondary | ICD-10-CM | POA: Diagnosis not present

## 2015-06-18 DIAGNOSIS — D631 Anemia in chronic kidney disease: Secondary | ICD-10-CM | POA: Diagnosis not present

## 2015-06-18 DIAGNOSIS — Z23 Encounter for immunization: Secondary | ICD-10-CM | POA: Diagnosis not present

## 2015-06-18 DIAGNOSIS — Z992 Dependence on renal dialysis: Secondary | ICD-10-CM | POA: Diagnosis not present

## 2015-06-18 DIAGNOSIS — N186 End stage renal disease: Secondary | ICD-10-CM | POA: Diagnosis not present

## 2015-06-18 DIAGNOSIS — N2581 Secondary hyperparathyroidism of renal origin: Secondary | ICD-10-CM | POA: Diagnosis not present

## 2015-06-18 DIAGNOSIS — D509 Iron deficiency anemia, unspecified: Secondary | ICD-10-CM | POA: Diagnosis not present

## 2015-06-19 ENCOUNTER — Other Ambulatory Visit: Payer: Self-pay | Admitting: *Deleted

## 2015-06-19 NOTE — Patient Outreach (Signed)
[facility]) Care Management  06/19/2015  Theresa [name] July 22, 1955 [id number]   RN Health Coach attempted introduction telephone call to patient.  [employer] compliance voicemail left with return callback number.  [place] Care Management [phone]

## 2015-06-20 DIAGNOSIS — N2581 Secondary hyperparathyroidism of renal origin: Secondary | ICD-10-CM | POA: Diagnosis not present

## 2015-06-20 DIAGNOSIS — Z23 Encounter for immunization: Secondary | ICD-10-CM | POA: Diagnosis not present

## 2015-06-20 DIAGNOSIS — Z992 Dependence on renal dialysis: Secondary | ICD-10-CM | POA: Diagnosis not present

## 2015-06-20 DIAGNOSIS — N186 End stage renal disease: Secondary | ICD-10-CM | POA: Diagnosis not present

## 2015-06-20 DIAGNOSIS — D631 Anemia in chronic kidney disease: Secondary | ICD-10-CM | POA: Diagnosis not present

## 2015-06-20 DIAGNOSIS — D509 Iron deficiency anemia, unspecified: Secondary | ICD-10-CM | POA: Diagnosis not present

## 2015-06-21 DIAGNOSIS — E119 Type 2 diabetes mellitus without complications: Secondary | ICD-10-CM | POA: Diagnosis not present

## 2015-06-21 DIAGNOSIS — I120 Hypertensive chronic kidney disease with stage 5 chronic kidney disease or end stage renal disease: Secondary | ICD-10-CM | POA: Diagnosis not present

## 2015-06-21 DIAGNOSIS — I25111 Atherosclerotic heart disease of native coronary artery with angina pectoris with documented spasm: Secondary | ICD-10-CM | POA: Diagnosis not present

## 2015-06-21 DIAGNOSIS — I739 Peripheral vascular disease, unspecified: Secondary | ICD-10-CM | POA: Diagnosis not present

## 2015-06-21 DIAGNOSIS — N186 End stage renal disease: Secondary | ICD-10-CM | POA: Diagnosis not present

## 2015-06-21 DIAGNOSIS — I214 Non-ST elevation (NSTEMI) myocardial infarction: Secondary | ICD-10-CM | POA: Diagnosis not present

## 2015-06-22 ENCOUNTER — Other Ambulatory Visit: Payer: Self-pay | Admitting: *Deleted

## 2015-06-22 DIAGNOSIS — N186 End stage renal disease: Secondary | ICD-10-CM | POA: Diagnosis not present

## 2015-06-22 DIAGNOSIS — I214 Non-ST elevation (NSTEMI) myocardial infarction: Secondary | ICD-10-CM | POA: Diagnosis not present

## 2015-06-22 DIAGNOSIS — E119 Type 2 diabetes mellitus without complications: Secondary | ICD-10-CM | POA: Diagnosis not present

## 2015-06-22 DIAGNOSIS — Z992 Dependence on renal dialysis: Secondary | ICD-10-CM | POA: Diagnosis not present

## 2015-06-22 DIAGNOSIS — I120 Hypertensive chronic kidney disease with stage 5 chronic kidney disease or end stage renal disease: Secondary | ICD-10-CM | POA: Diagnosis not present

## 2015-06-22 DIAGNOSIS — I739 Peripheral vascular disease, unspecified: Secondary | ICD-10-CM | POA: Diagnosis not present

## 2015-06-22 DIAGNOSIS — Z23 Encounter for immunization: Secondary | ICD-10-CM | POA: Diagnosis not present

## 2015-06-22 DIAGNOSIS — D631 Anemia in chronic kidney disease: Secondary | ICD-10-CM | POA: Diagnosis not present

## 2015-06-22 DIAGNOSIS — N2581 Secondary hyperparathyroidism of renal origin: Secondary | ICD-10-CM | POA: Diagnosis not present

## 2015-06-22 DIAGNOSIS — D509 Iron deficiency anemia, unspecified: Secondary | ICD-10-CM | POA: Diagnosis not present

## 2015-06-22 DIAGNOSIS — I25111 Atherosclerotic heart disease of native coronary artery with angina pectoris with documented spasm: Secondary | ICD-10-CM | POA: Diagnosis not present

## 2015-06-22 NOTE — Patient Outreach (Signed)
[facility]) Care Management  06/22/2015  Theresa [name] 1956/03/25 [id number]   RN Health Coach attempted 2nd Introductory outreach call to patient.  Patient was unavailable. HIPPA compliance voicemail message was left with return callback number.    [place] Care Management [phone]

## 2015-06-25 DIAGNOSIS — Z23 Encounter for immunization: Secondary | ICD-10-CM | POA: Diagnosis not present

## 2015-06-25 DIAGNOSIS — D631 Anemia in chronic kidney disease: Secondary | ICD-10-CM | POA: Diagnosis not present

## 2015-06-25 DIAGNOSIS — N2581 Secondary hyperparathyroidism of renal origin: Secondary | ICD-10-CM | POA: Diagnosis not present

## 2015-06-25 DIAGNOSIS — N186 End stage renal disease: Secondary | ICD-10-CM | POA: Diagnosis not present

## 2015-06-25 DIAGNOSIS — Z992 Dependence on renal dialysis: Secondary | ICD-10-CM | POA: Diagnosis not present

## 2015-06-25 DIAGNOSIS — D509 Iron deficiency anemia, unspecified: Secondary | ICD-10-CM | POA: Diagnosis not present

## 2015-06-27 DIAGNOSIS — N186 End stage renal disease: Secondary | ICD-10-CM | POA: Diagnosis not present

## 2015-06-27 DIAGNOSIS — Z23 Encounter for immunization: Secondary | ICD-10-CM | POA: Diagnosis not present

## 2015-06-27 DIAGNOSIS — D509 Iron deficiency anemia, unspecified: Secondary | ICD-10-CM | POA: Diagnosis not present

## 2015-06-27 DIAGNOSIS — N2581 Secondary hyperparathyroidism of renal origin: Secondary | ICD-10-CM | POA: Diagnosis not present

## 2015-06-27 DIAGNOSIS — Z992 Dependence on renal dialysis: Secondary | ICD-10-CM | POA: Diagnosis not present

## 2015-06-27 DIAGNOSIS — D631 Anemia in chronic kidney disease: Secondary | ICD-10-CM | POA: Diagnosis not present

## 2015-06-28 ENCOUNTER — Encounter: Payer: Self-pay | Admitting: Family

## 2015-06-28 ENCOUNTER — Ambulatory Visit: Payer: Medicare Other | Attending: Family | Admitting: Family

## 2015-06-28 VITALS — BP 152/72 | HR 69 | Resp 20 | Ht 67.0 in | Wt 172.0 lb

## 2015-06-28 DIAGNOSIS — Z992 Dependence on renal dialysis: Secondary | ICD-10-CM | POA: Insufficient documentation

## 2015-06-28 DIAGNOSIS — Z79899 Other long term (current) drug therapy: Secondary | ICD-10-CM | POA: Insufficient documentation

## 2015-06-28 DIAGNOSIS — I2510 Atherosclerotic heart disease of native coronary artery without angina pectoris: Secondary | ICD-10-CM | POA: Insufficient documentation

## 2015-06-28 DIAGNOSIS — Z87891 Personal history of nicotine dependence: Secondary | ICD-10-CM | POA: Diagnosis not present

## 2015-06-28 DIAGNOSIS — I5032 Chronic diastolic (congestive) heart failure: Secondary | ICD-10-CM | POA: Diagnosis not present

## 2015-06-28 DIAGNOSIS — E785 Hyperlipidemia, unspecified: Secondary | ICD-10-CM | POA: Insufficient documentation

## 2015-06-28 DIAGNOSIS — N186 End stage renal disease: Secondary | ICD-10-CM | POA: Insufficient documentation

## 2015-06-28 DIAGNOSIS — D573 Sickle-cell trait: Secondary | ICD-10-CM | POA: Insufficient documentation

## 2015-06-28 DIAGNOSIS — Z794 Long term (current) use of insulin: Secondary | ICD-10-CM | POA: Diagnosis not present

## 2015-06-28 DIAGNOSIS — I10 Essential (primary) hypertension: Secondary | ICD-10-CM

## 2015-06-28 DIAGNOSIS — K5909 Other constipation: Secondary | ICD-10-CM | POA: Diagnosis not present

## 2015-06-28 DIAGNOSIS — I110 Hypertensive heart disease with heart failure: Secondary | ICD-10-CM | POA: Diagnosis not present

## 2015-06-28 DIAGNOSIS — I739 Peripheral vascular disease, unspecified: Secondary | ICD-10-CM | POA: Insufficient documentation

## 2015-06-28 DIAGNOSIS — E1140 Type 2 diabetes mellitus with diabetic neuropathy, unspecified: Secondary | ICD-10-CM | POA: Diagnosis not present

## 2015-06-28 DIAGNOSIS — J449 Chronic obstructive pulmonary disease, unspecified: Secondary | ICD-10-CM | POA: Insufficient documentation

## 2015-06-28 DIAGNOSIS — I120 Hypertensive chronic kidney disease with stage 5 chronic kidney disease or end stage renal disease: Secondary | ICD-10-CM | POA: Insufficient documentation

## 2015-06-28 DIAGNOSIS — Z86010 Personal history of colonic polyps: Secondary | ICD-10-CM | POA: Diagnosis not present

## 2015-06-28 DIAGNOSIS — Z7982 Long term (current) use of aspirin: Secondary | ICD-10-CM | POA: Diagnosis not present

## 2015-06-28 DIAGNOSIS — Z8701 Personal history of pneumonia (recurrent): Secondary | ICD-10-CM | POA: Insufficient documentation

## 2015-06-28 DIAGNOSIS — E11319 Type 2 diabetes mellitus with unspecified diabetic retinopathy without macular edema: Secondary | ICD-10-CM | POA: Diagnosis not present

## 2015-06-28 DIAGNOSIS — Z9889 Other specified postprocedural states: Secondary | ICD-10-CM | POA: Insufficient documentation

## 2015-06-28 DIAGNOSIS — E1122 Type 2 diabetes mellitus with diabetic chronic kidney disease: Secondary | ICD-10-CM

## 2015-06-28 MED ORDER — CARVEDILOL 12.5 MG PO TABS: 12.5000 mg | ORAL_TABLET | Freq: Two times a day (BID) | ORAL | Status: DC

## 2015-06-28 MED ORDER — INSULIN DETEMIR 100 UNIT/ML FLEXPEN: 5.0000 [IU] | PEN_INJECTOR | Freq: Every day | SUBCUTANEOUS | Status: DC

## 2015-06-28 NOTE — Progress Notes (Signed)
Subjective:    Patient ID: Theresa [name], female    DOB: 06-08-1956, 60 y.o.   MRN: [id number]  Congestive Heart Failure Presents for follow-up visit. The disease course has been stable. Pertinent negatives include no abdominal pain, chest pain, edema, fatigue, orthopnea, palpitations or shortness of breath. The symptoms have been improving. Past treatments include beta blockers and salt and fluid restriction. The treatment provided significant relief. Compliance with prior treatments has been good. Her past medical history is significant for anemia, CAD, chronic lung disease, DM and HTN. She has multiple 1st degree relatives with heart disease. Compliance with total regimen is 76-100%.  Hypertension This is a chronic problem. The current episode started more than 1 year ago. The problem is unchanged. Pertinent negatives include no chest pain, headaches, neck pain, palpitations, peripheral edema or shortness of breath. There are no associated agents to hypertension. Risk factors for coronary artery disease include diabetes mellitus and dyslipidemia. Past treatments include beta blockers, calcium channel blockers, diuretics and lifestyle changes. The current treatment provides moderate improvement. There are no compliance problems.  Hypertensive end-organ damage includes kidney disease, CAD/MI and heart failure.    Past Medical History  Diagnosis Date  . Hypertension   . ESRD on hemodialysis ([place])     a. [facility] in [place], [place], on a TTS schedule.  She started dialysis in Feb 2014.  Etiology of renal failure not known, likely diabetes.  Has a left upper arm AV graft.  Marland Kitchen COPD (chronic obstructive pulmonary disease) ([place])   . Emphysema   . History of pneumonia     June 2012  . History of bronchitis     Mar 2012  . Peripheral vascular disease ([place])   . Chronic constipation   . Colon polyps   . Sickle cell trait ([place])   . Anemia of chronic disease   . Diabetes mellitus   . Moderate mitral  insufficiency     a. 10/2013 Echo: EF 55-60%, mod MR.  . Diabetic neuropathy ([place])   . Diabetic retinopathy ([place]) 05/28/2013    Hx bilat retinal detachment, proliferative diab retinopathy and bilat vitreous hemorrhage   . Hyperlipidemia   . Coronary artery disease     a. 05/2013 NSTEMI/PCI: LM 20d, LAD min irregs, LCX small, nl, OM1 nl, RCA dom 57m (2.5x16 Promus DES), PDA1 80p.  . Chronic diastolic CHF (congestive heart failure) ([place])     a. 10/2013 Echo [facility]): EF 55-60%, mod conc LVH, mod MR, mildly dil LA, mild Ao sclerosis w/o stenosis.  . Carotid arterial disease ([place])     a. 02/2013 U/S: 40-59% bilat ICA stenosis - *f/u 02/2014*  . History of tobacco abuse     a. Quit 2012.    Past Surgical History  Procedure Laterality Date  . Abdominal hysterectomy      2000  . Dilation and curettage of uterus      several in the early 80's  . Eye surgery      bilateral laser 2012  . Esophagogastroduodenoscopy      2012  . Colonscopy    . Tubal ligation      1979  . Pars plana vitrectomy  04/22/2011    Procedure: PARS PLANA VITRECTOMY WITH 25 GAUGE;  Surgeon: [name], MD;  Location: [place];  Service: Ophthalmology;  Laterality: Left;  membrane peel, endolaser, gas fluid exchange, silicone oil, repair of complex traction retinal detachment  . Pars plana vitrectomy  09/30/2011    Procedure: PARS  PLANA VITRECTOMY WITH 25 GAUGE;  Surgeon: [name], MD;  Location: [place];  Service: Ophthalmology;  Laterality: Right;  Endolaser; Repair of Complex Traction Retinal Detachment  . Gas/fluid exchange  09/30/2011    Procedure: GAS/FLUID EXCHANGE;  Surgeon: [name], MD;  Location: [place];  Service: Ophthalmology;  Laterality: Right;  . Gas insertion  09/30/2011    Procedure: INSERTION OF GAS;  Surgeon: [name], MD;  Location: [place];  Service: Ophthalmology;  Laterality: Right;  [id number]  . Eye surgery      right  . Pars plana vitrectomy  02/24/2012    Procedure: PARS PLANA  VITRECTOMY WITH 25 GAUGE;  Surgeon: [name], MD;  Location: [place];  Service: Ophthalmology;  Laterality: Left;  . Silicon oil removal  [id number]    Procedure: SILICON OIL REMOVAL;  Surgeon: [name], MD;  Location: [place];  Service: Ophthalmology;  Laterality: Left;  . Ptca    . Thrombectomy / arteriovenous graft revision    . Cardiac catheterization    . Coronary angioplasty  05/28/2014    stent placement to the mid RCA  . Left heart catheterization with coronary angiogram N/A 05/28/2013    Procedure: LEFT HEART CATHETERIZATION WITH CORONARY ANGIOGRAM;  Surgeon: [name], MD;  Location: [facility] CATH LAB;  Service: Cardiovascular;  Laterality: N/A;  . Cardiac catheterization N/A 05/02/2015    Procedure: Left Heart Cath and Coronary Angiography;  Surgeon: [name], MD;  Location: [place] [facility] CV LAB;  Service: Cardiovascular;  Laterality: N/A;    Family History  Problem Relation Age of Onset  . Anesthesia problems Neg Hx   . Hypotension Neg Hx   . Malignant hyperthermia Neg Hx   . Pseudochol deficiency Neg Hx   . Stroke Mother   . Heart attack Mother   . Heart disease Mother   . Colon cancer Father   . Colon cancer Sister   . Heart attack Brother   . Stroke Brother   . Diabetes Brother   . Breast cancer Sister   . Diabetes Brother   . Diabetes Daughter     Social History  Substance Use Topics  . Smoking status: Former Smoker -- 1.00 packs/day for 25 years    Quit date: 06/09/2010  . Smokeless tobacco: Never Used  . Alcohol Use: No    Allergies  Allergen Reactions  . Metformin Diarrhea  . Hydrocodone Hives    Prior to Admission medications   Medication Sig Start Date End Date Taking? Authorizing Provider  albuterol (PROVENTIL HFA;VENTOLIN HFA) 108 (90 BASE) MCG/ACT inhaler Inhale 2 puffs into the lungs every 6 (six) hours as needed for wheezing or shortness of breath.   Yes Historical Provider, MD  albuterol (PROVENTIL) (2.5 MG/3ML) 0.083%  nebulizer solution Take 2.5 mg by nebulization every 4 (four) hours as needed for wheezing or shortness of breath.   Yes Historical Provider, MD  amLODipine (NORVASC) 10 MG tablet Take 1 tablet (10 mg total) by mouth daily. 05/04/15  Yes Bettey Costa, MD  aspirin EC 81 MG tablet Take 81 mg by mouth daily.   Yes Historical Provider, MD  calcium acetate (PHOSLO) 667 MG capsule Take 1,334 mg by mouth 2 (two) times daily with a meal.   Yes Historical Provider, MD  carvedilol (COREG) 12.5 MG tablet Take 1 tablet (12.5 mg total) by mouth 2 (two) times daily. 06/28/15  Yes Alisa Graff, FNP  cloNIDine (CATAPRES) 0.1 MG tablet Take  1 tablet (0.1 mg total) by mouth 2 (two) times daily. 01/30/15  Yes Minna Merritts, MD  CRESTOR 40 MG tablet TAKE ONE TABLET BY MOUTH ONCE DAILY 05/11/15  Yes Minna Merritts, MD  hydrALAZINE (APRESOLINE) 25 MG tablet Take 1 tablet (25 mg total) by mouth every 8 (eight) hours. 05/10/15  Yes Jackolyn Confer, MD  Insulin Detemir (LEVEMIR) 100 UNIT/ML Pen Inject 5 Units into the skin daily. 06/28/15  Yes Alisa Graff, FNP  multivitamin (RENA-VIT) TABS tablet Take 1 tablet by mouth daily.   Yes Historical Provider, MD  nitroGLYCERIN (NITROSTAT) 0.4 MG SL tablet Place 1 tablet (0.4 mg total) under the tongue every 5 (five) minutes as needed for chest pain. 06/03/13  Yes Rhonda G Barrett, PA-C  oxyCODONE-acetaminophen (ROXICET) 5-325 MG tablet Take 1 tablet by mouth every 6 (six) hours as needed for severe pain. 06/12/15  Yes Duanne Guess, PA-C  pregabalin (LYRICA) 75 MG capsule Take 1 capsule (75 mg total) by mouth daily. 03/28/14  Yes Jackolyn Confer, MD     Review of Systems  Constitutional: Negative for appetite change and fatigue.  HENT: Negative for congestion, postnasal drip and sore throat.   Eyes: Negative.   Respiratory: Negative for cough, chest tightness and shortness of breath.   Cardiovascular: Negative for chest pain, palpitations and leg swelling.   Gastrointestinal: Negative for abdominal pain and abdominal distention.  Endocrine: Negative.   Genitourinary: Negative.   Musculoskeletal: Positive for back pain (chronic). Negative for neck pain.  Skin: Negative.   Allergic/Immunologic: Negative.   Neurological: Negative for dizziness, light-headedness and headaches.  Hematological: Negative for adenopathy. Does not bruise/bleed easily.  Psychiatric/Behavioral: Negative for sleep disturbance (sleeping on 1 pillow) and dysphoric mood. The patient is not nervous/anxious.        Objective:   Physical Exam  Constitutional: She is oriented to person, place, and time. She appears well-developed and well-nourished.  HENT:  Head: Normocephalic and atraumatic.  Eyes: Conjunctivae are normal. Pupils are equal, round, and reactive to light.  Neck: Normal range of motion. Neck supple.  Cardiovascular: Normal rate and regular rhythm.   Pulmonary/Chest: Effort normal. She has no wheezes. She has no rales.  Abdominal: Soft. She exhibits no distension. There is no tenderness.  Musculoskeletal: She exhibits no edema or tenderness.  Neurological: She is alert and oriented to person, place, and time.  Skin: Skin is warm and dry.  Psychiatric: She has a normal mood and affect. Her behavior is normal. Thought content normal.  Nursing note and vitals reviewed.   BP 152/72 mmHg  Pulse 69  Resp 20  Ht 5\' 7"  (1.702 m)  Wt 172 lb (78.019 kg)  BMI 26.93 kg/m2  SpO2 98%       Assessment & Plan:  1: Chronic heart failure with preserved ejection fraction- Patient presents without any fatigue, shortness of breath, edema or weight gain. She continues to weigh herself daily and reports a stable weight. By our scale, her weight is essentially unchanged. Reminded to call for an overnight weight gain of >2 pounds or a weekly weight gain of >5 pounds. She is not adding salt to her food after it's been prepared and has been reading food labels closely. She is  trying to follow a  2000mg  sodium diet. She is quite active and has continued to work with physical therapy on building up her endurance. She is now walking to the corner store from her house which is approximately 1/2  mile round trip and can do so without any difficulty. [facility] went in and reviewed medications with the patient.  2: HTN- Blood pressure looks ok today. Continue medications at this time. 3: Diabetes- Patient says that her glucose this morning was 107. Insulin was refilled for her today. 4: ESRD on dialysis- Patient continues to go to dialysis on M, W, F and is reporting that she's tolerating that well. She says that her nephrologist has her on a diuretic but she doesn't remember the name of it. She says that she will call back with the name/dosage of medication.   Return here in 3 months or sooner for any questions/problems before then.

## 2015-06-28 NOTE — Patient Instructions (Signed)
Continue weighing daily and call for an overnight weight gain of > 2 pounds or a weekly weight gain of >5 pounds. 

## 2015-06-29 DIAGNOSIS — N2581 Secondary hyperparathyroidism of renal origin: Secondary | ICD-10-CM | POA: Diagnosis not present

## 2015-06-29 DIAGNOSIS — Z992 Dependence on renal dialysis: Secondary | ICD-10-CM | POA: Diagnosis not present

## 2015-06-29 DIAGNOSIS — D631 Anemia in chronic kidney disease: Secondary | ICD-10-CM | POA: Diagnosis not present

## 2015-06-29 DIAGNOSIS — Z23 Encounter for immunization: Secondary | ICD-10-CM | POA: Diagnosis not present

## 2015-06-29 DIAGNOSIS — D509 Iron deficiency anemia, unspecified: Secondary | ICD-10-CM | POA: Diagnosis not present

## 2015-06-29 DIAGNOSIS — N186 End stage renal disease: Secondary | ICD-10-CM | POA: Diagnosis not present

## 2015-07-02 DIAGNOSIS — D509 Iron deficiency anemia, unspecified: Secondary | ICD-10-CM | POA: Diagnosis not present

## 2015-07-02 DIAGNOSIS — N2581 Secondary hyperparathyroidism of renal origin: Secondary | ICD-10-CM | POA: Diagnosis not present

## 2015-07-02 DIAGNOSIS — Z992 Dependence on renal dialysis: Secondary | ICD-10-CM | POA: Diagnosis not present

## 2015-07-02 DIAGNOSIS — N186 End stage renal disease: Secondary | ICD-10-CM | POA: Diagnosis not present

## 2015-07-02 DIAGNOSIS — Z23 Encounter for immunization: Secondary | ICD-10-CM | POA: Diagnosis not present

## 2015-07-02 DIAGNOSIS — D631 Anemia in chronic kidney disease: Secondary | ICD-10-CM | POA: Diagnosis not present

## 2015-07-03 DIAGNOSIS — I214 Non-ST elevation (NSTEMI) myocardial infarction: Secondary | ICD-10-CM | POA: Diagnosis not present

## 2015-07-03 DIAGNOSIS — N186 End stage renal disease: Secondary | ICD-10-CM | POA: Diagnosis not present

## 2015-07-03 DIAGNOSIS — E119 Type 2 diabetes mellitus without complications: Secondary | ICD-10-CM | POA: Diagnosis not present

## 2015-07-03 DIAGNOSIS — I739 Peripheral vascular disease, unspecified: Secondary | ICD-10-CM | POA: Diagnosis not present

## 2015-07-03 DIAGNOSIS — I120 Hypertensive chronic kidney disease with stage 5 chronic kidney disease or end stage renal disease: Secondary | ICD-10-CM | POA: Diagnosis not present

## 2015-07-03 DIAGNOSIS — I25111 Atherosclerotic heart disease of native coronary artery with angina pectoris with documented spasm: Secondary | ICD-10-CM | POA: Diagnosis not present

## 2015-07-04 DIAGNOSIS — Z23 Encounter for immunization: Secondary | ICD-10-CM | POA: Diagnosis not present

## 2015-07-04 DIAGNOSIS — N2581 Secondary hyperparathyroidism of renal origin: Secondary | ICD-10-CM | POA: Diagnosis not present

## 2015-07-04 DIAGNOSIS — D509 Iron deficiency anemia, unspecified: Secondary | ICD-10-CM | POA: Diagnosis not present

## 2015-07-04 DIAGNOSIS — Z992 Dependence on renal dialysis: Secondary | ICD-10-CM | POA: Diagnosis not present

## 2015-07-04 DIAGNOSIS — D631 Anemia in chronic kidney disease: Secondary | ICD-10-CM | POA: Diagnosis not present

## 2015-07-04 DIAGNOSIS — N186 End stage renal disease: Secondary | ICD-10-CM | POA: Diagnosis not present

## 2015-07-06 ENCOUNTER — Encounter: Payer: Self-pay | Admitting: Sports Medicine

## 2015-07-06 ENCOUNTER — Ambulatory Visit (INDEPENDENT_AMBULATORY_CARE_PROVIDER_SITE_OTHER): Payer: Medicare Other | Admitting: Sports Medicine

## 2015-07-06 ENCOUNTER — Other Ambulatory Visit: Payer: Self-pay | Admitting: *Deleted

## 2015-07-06 DIAGNOSIS — D631 Anemia in chronic kidney disease: Secondary | ICD-10-CM | POA: Diagnosis not present

## 2015-07-06 DIAGNOSIS — B351 Tinea unguium: Secondary | ICD-10-CM | POA: Diagnosis not present

## 2015-07-06 DIAGNOSIS — N186 End stage renal disease: Secondary | ICD-10-CM | POA: Diagnosis not present

## 2015-07-06 DIAGNOSIS — Z23 Encounter for immunization: Secondary | ICD-10-CM | POA: Diagnosis not present

## 2015-07-06 DIAGNOSIS — Z992 Dependence on renal dialysis: Secondary | ICD-10-CM | POA: Diagnosis not present

## 2015-07-06 DIAGNOSIS — E1140 Type 2 diabetes mellitus with diabetic neuropathy, unspecified: Secondary | ICD-10-CM | POA: Diagnosis not present

## 2015-07-06 DIAGNOSIS — N2581 Secondary hyperparathyroidism of renal origin: Secondary | ICD-10-CM | POA: Diagnosis not present

## 2015-07-06 DIAGNOSIS — D509 Iron deficiency anemia, unspecified: Secondary | ICD-10-CM | POA: Diagnosis not present

## 2015-07-06 DIAGNOSIS — M79676 Pain in unspecified toe(s): Secondary | ICD-10-CM

## 2015-07-06 NOTE — Patient Outreach (Signed)
[facility]) Care Management  07/06/2015  MYSTICAL STORZ 27-Mar-1956 [id number]   RN Health Coach  3rd Attempt introduction outreach call to patient.  Patient was unavailable. [employer] Compliance voice mail left with call back number.  [facility] Care Management [phone]

## 2015-07-06 NOTE — Progress Notes (Signed)
Patient ID: Theresa [name], female   DOB: [birth year], 60 y.o.   MRN: [id number] Subjective: Theresa [name] is a 60 y.o. female patient with history of type 2 diabetes who presents to office today complaining of long, painful nails  while ambulating in shoes; unable to trim. Patient states that the glucose reading yesterday was 102mg /dl. Patient denies any new changes in medication or new problems. Patient denies any new cramping, numbness, burning or tingling in the legs.  Patient Active Problem List   Diagnosis Date Noted  . Diastolic heart failure ([place]) 05/07/2015  . Cephalalgia   . Coronary artery disease involving native coronary artery of native heart with angina pectoris with documented spasm ([place])   . S/P coronary artery stent placement   . Hypertension secondary to other renal disorders   . ESRD (end stage renal disease) ([place]) 05/02/2015  . Neuritis or radiculitis due to rupture of lumbar intervertebral disc 11/28/2014  . Right hip pain 06/13/2014  . Routine general medical examination at a health care facility 09/02/2013  . Osteoarthritis of right hip 08/23/2013  . DDD (degenerative disc disease), lumbosacral 08/23/2013  . Coronary artery disease 06/27/2013  . Diabetic retinopathy ([place]) 05/28/2013  . Hyperlipidemia   . Essential (primary) hypertension 11/08/2012  . Diabetic neuropathy ([place]) 02/18/2012  . Anemia in chronic kidney disease (CKD) 02/18/2012  . ESRD on hemodialysis ([place]) 08/21/2011  . Diabetes mellitus, type II ([place]) 06/30/2011  . COPD (chronic obstructive pulmonary disease) ([place]) 06/13/2011   Current Outpatient Prescriptions on File Prior to Visit  Medication Sig Dispense Refill  . albuterol (PROVENTIL HFA;VENTOLIN HFA) 108 (90 BASE) MCG/ACT inhaler Inhale 2 puffs into the lungs every 6 (six) hours as needed for wheezing or shortness of breath.    Marland Kitchen albuterol (PROVENTIL) (2.5 MG/3ML) 0.083% nebulizer solution Take 2.5 mg by nebulization every 4 (four) hours as needed  for wheezing or shortness of breath.    Marland Kitchen amLODipine (NORVASC) 10 MG tablet Take 1 tablet (10 mg total) by mouth daily. 30 tablet 0  . aspirin EC 81 MG tablet Take 81 mg by mouth daily.    . calcium acetate (PHOSLO) 667 MG capsule Take 1,334 mg by mouth 2 (two) times daily with a meal.    . carvedilol (COREG) 12.5 MG tablet Take 1 tablet (12.5 mg total) by mouth 2 (two) times daily. 60 tablet 6  . cloNIDine (CATAPRES) 0.1 MG tablet Take 1 tablet (0.1 mg total) by mouth 2 (two) times daily. 60 tablet 11  . CRESTOR 40 MG tablet TAKE ONE TABLET BY MOUTH ONCE DAILY 90 tablet 3  . hydrALAZINE (APRESOLINE) 25 MG tablet Take 1 tablet (25 mg total) by mouth every 8 (eight) hours. 90 tablet 3  . Insulin Detemir (LEVEMIR) 100 UNIT/ML Pen Inject 5 Units into the skin daily. 15 mL 6  . multivitamin (RENA-VIT) TABS tablet Take 1 tablet by mouth daily.    . nitroGLYCERIN (NITROSTAT) 0.4 MG SL tablet Place 1 tablet (0.4 mg total) under the tongue every 5 (five) minutes as needed for chest pain. 25 tablet 3  . oxyCODONE-acetaminophen (ROXICET) 5-325 MG tablet Take 1 tablet by mouth every 6 (six) hours as needed for severe pain. 15 tablet 0  . pregabalin (LYRICA) 75 MG capsule Take 1 capsule (75 mg total) by mouth daily. 30 capsule 3   No current facility-administered medications on file prior to visit.   Allergies  Allergen Reactions  . Metformin Diarrhea  . Hydrocodone Hives  Objective: General: Patient is awake, alert, and oriented x 3 and in no acute distress.  Integument: Skin is warm, dry and supple bilateral. Nails are tender, long, thickened and  dystrophic with subungual debris, consistent with onychomycosis, 1-5 bilateral. No signs of infection. No open lesions or preulcerative lesions present bilateral. Remaining integument unremarkable.  Vasculature:  Dorsalis Pedis pulse 2/4 bilateral. Posterior Tibial pulse  1/4 bilateral.  Capillary fill time <3 sec 1-5 bilateral. Scant hair growth to  the level of the digits. Temperature gradient within normal limits. No varicosities present bilateral. No edema present bilateral.   Neurology: The patient has intact sensation measured with a 5.07/10g Semmes Weinstein Monofilament at all pedal sites bilateral . Vibratory sensation diminished bilateral with tuning fork. No Babinski sign present bilateral.   Musculoskeletal: Mild asymptomatic hammertoes bilateral. Muscular strength 5/5 in all lower extremity muscular groups bilateral without pain or limitation on range of motion . No tenderness with calf compression bilateral.  Assessment and Plan: Problem List Items Addressed This Visit      Endocrine   Diabetes mellitus, type II ([place])    Other Visit Diagnoses    Dermatophytosis of nail    -  Primary    Pain of toe, unspecified laterality          -Examined patient. -Discussed and educated patient on diabetic foot care, especially with  regards to the vascular, neurological and musculoskeletal systems.  -Stressed the importance of good glycemic control and the detriment of not  controlling glucose levels in relation to the foot. -Mechanically debrided all nails 1-5 bilateral using sterile nail nipper and filed with dremel without incident  -Answered all patient questions -Patient to return as needed or in 3 months for at risk foot care -Patient advised to call the office if any problems or questions arise in the  Meantime.  Landis Martins, DPM

## 2015-07-09 DIAGNOSIS — Z23 Encounter for immunization: Secondary | ICD-10-CM | POA: Diagnosis not present

## 2015-07-09 DIAGNOSIS — N2581 Secondary hyperparathyroidism of renal origin: Secondary | ICD-10-CM | POA: Diagnosis not present

## 2015-07-09 DIAGNOSIS — D631 Anemia in chronic kidney disease: Secondary | ICD-10-CM | POA: Diagnosis not present

## 2015-07-09 DIAGNOSIS — N186 End stage renal disease: Secondary | ICD-10-CM | POA: Diagnosis not present

## 2015-07-09 DIAGNOSIS — D509 Iron deficiency anemia, unspecified: Secondary | ICD-10-CM | POA: Diagnosis not present

## 2015-07-09 DIAGNOSIS — Z992 Dependence on renal dialysis: Secondary | ICD-10-CM | POA: Diagnosis not present

## 2015-07-11 DIAGNOSIS — N186 End stage renal disease: Secondary | ICD-10-CM | POA: Diagnosis not present

## 2015-07-11 DIAGNOSIS — D509 Iron deficiency anemia, unspecified: Secondary | ICD-10-CM | POA: Diagnosis not present

## 2015-07-11 DIAGNOSIS — Z992 Dependence on renal dialysis: Secondary | ICD-10-CM | POA: Diagnosis not present

## 2015-07-11 DIAGNOSIS — D631 Anemia in chronic kidney disease: Secondary | ICD-10-CM | POA: Diagnosis not present

## 2015-07-11 DIAGNOSIS — N2581 Secondary hyperparathyroidism of renal origin: Secondary | ICD-10-CM | POA: Diagnosis not present

## 2015-07-13 DIAGNOSIS — D631 Anemia in chronic kidney disease: Secondary | ICD-10-CM | POA: Diagnosis not present

## 2015-07-13 DIAGNOSIS — N186 End stage renal disease: Secondary | ICD-10-CM | POA: Diagnosis not present

## 2015-07-13 DIAGNOSIS — N2581 Secondary hyperparathyroidism of renal origin: Secondary | ICD-10-CM | POA: Diagnosis not present

## 2015-07-13 DIAGNOSIS — Z992 Dependence on renal dialysis: Secondary | ICD-10-CM | POA: Diagnosis not present

## 2015-07-13 DIAGNOSIS — D509 Iron deficiency anemia, unspecified: Secondary | ICD-10-CM | POA: Diagnosis not present

## 2015-07-16 DIAGNOSIS — D509 Iron deficiency anemia, unspecified: Secondary | ICD-10-CM | POA: Diagnosis not present

## 2015-07-16 DIAGNOSIS — N186 End stage renal disease: Secondary | ICD-10-CM | POA: Diagnosis not present

## 2015-07-16 DIAGNOSIS — Z992 Dependence on renal dialysis: Secondary | ICD-10-CM | POA: Diagnosis not present

## 2015-07-16 DIAGNOSIS — N2581 Secondary hyperparathyroidism of renal origin: Secondary | ICD-10-CM | POA: Diagnosis not present

## 2015-07-16 DIAGNOSIS — D631 Anemia in chronic kidney disease: Secondary | ICD-10-CM | POA: Diagnosis not present

## 2015-07-18 DIAGNOSIS — D631 Anemia in chronic kidney disease: Secondary | ICD-10-CM | POA: Diagnosis not present

## 2015-07-18 DIAGNOSIS — Z992 Dependence on renal dialysis: Secondary | ICD-10-CM | POA: Diagnosis not present

## 2015-07-18 DIAGNOSIS — D509 Iron deficiency anemia, unspecified: Secondary | ICD-10-CM | POA: Diagnosis not present

## 2015-07-18 DIAGNOSIS — N186 End stage renal disease: Secondary | ICD-10-CM | POA: Diagnosis not present

## 2015-07-18 DIAGNOSIS — N2581 Secondary hyperparathyroidism of renal origin: Secondary | ICD-10-CM | POA: Diagnosis not present

## 2015-07-20 DIAGNOSIS — D509 Iron deficiency anemia, unspecified: Secondary | ICD-10-CM | POA: Diagnosis not present

## 2015-07-20 DIAGNOSIS — N186 End stage renal disease: Secondary | ICD-10-CM | POA: Diagnosis not present

## 2015-07-20 DIAGNOSIS — N2581 Secondary hyperparathyroidism of renal origin: Secondary | ICD-10-CM | POA: Diagnosis not present

## 2015-07-20 DIAGNOSIS — D631 Anemia in chronic kidney disease: Secondary | ICD-10-CM | POA: Diagnosis not present

## 2015-07-20 DIAGNOSIS — Z992 Dependence on renal dialysis: Secondary | ICD-10-CM | POA: Diagnosis not present

## 2015-07-23 DIAGNOSIS — Z992 Dependence on renal dialysis: Secondary | ICD-10-CM | POA: Diagnosis not present

## 2015-07-23 DIAGNOSIS — N2581 Secondary hyperparathyroidism of renal origin: Secondary | ICD-10-CM | POA: Diagnosis not present

## 2015-07-23 DIAGNOSIS — D509 Iron deficiency anemia, unspecified: Secondary | ICD-10-CM | POA: Diagnosis not present

## 2015-07-23 DIAGNOSIS — N186 End stage renal disease: Secondary | ICD-10-CM | POA: Diagnosis not present

## 2015-07-23 DIAGNOSIS — E119 Type 2 diabetes mellitus without complications: Secondary | ICD-10-CM | POA: Diagnosis not present

## 2015-07-23 DIAGNOSIS — D631 Anemia in chronic kidney disease: Secondary | ICD-10-CM | POA: Diagnosis not present

## 2015-07-25 DIAGNOSIS — Z992 Dependence on renal dialysis: Secondary | ICD-10-CM | POA: Diagnosis not present

## 2015-07-25 DIAGNOSIS — N2581 Secondary hyperparathyroidism of renal origin: Secondary | ICD-10-CM | POA: Diagnosis not present

## 2015-07-25 DIAGNOSIS — D509 Iron deficiency anemia, unspecified: Secondary | ICD-10-CM | POA: Diagnosis not present

## 2015-07-25 DIAGNOSIS — N186 End stage renal disease: Secondary | ICD-10-CM | POA: Diagnosis not present

## 2015-07-25 DIAGNOSIS — D631 Anemia in chronic kidney disease: Secondary | ICD-10-CM | POA: Diagnosis not present

## 2015-07-26 ENCOUNTER — Other Ambulatory Visit: Payer: Self-pay | Admitting: *Deleted

## 2015-07-26 NOTE — Patient Outreach (Signed)
[facility]) Care Management  07/26/2015  MAKAYLIA KOONE May 27, 1956 [id number]   RN Health Coach # 4 attempted  outreach call to patient.  Patient was unavailable. HIPPA compliance voicemail message was left with return callback number.    Johny Shock, BSN, RN [facility] Care Management RN Health Coach Phone: [facility] complies with applicable Federal civil rights laws and does not discriminate on the basis of race, color, national origin, age, disability, or sex. Espaol (Spanish)  [place] cumple con las leyes federales de derechos civiles aplicables y no discrimina por motivos de raza, color, nacionalidad, edad, discapacidad o sexo.    Ti?ng Vi?t ([place])  [place] tun th? lu?t dn quy?n hi?n hnh c?a Lin bang v khng phn bi?t ?i x? d?a trn ch?ng t?c, mu da, ngu?n g?c qu?c gia, ? tu?i, khuy?t t?t, ho?c gi?i tnh.    (Arabic)    University                      .

## 2015-07-27 ENCOUNTER — Encounter: Payer: Self-pay | Admitting: Internal Medicine

## 2015-07-27 ENCOUNTER — Ambulatory Visit (INDEPENDENT_AMBULATORY_CARE_PROVIDER_SITE_OTHER): Payer: Medicare Other | Admitting: Internal Medicine

## 2015-07-27 VITALS — BP 140/70 | HR 74 | Temp 98.2°F | Resp 14 | Ht 67.0 in | Wt 174.0 lb

## 2015-07-27 DIAGNOSIS — D509 Iron deficiency anemia, unspecified: Secondary | ICD-10-CM | POA: Diagnosis not present

## 2015-07-27 DIAGNOSIS — E1122 Type 2 diabetes mellitus with diabetic chronic kidney disease: Secondary | ICD-10-CM | POA: Diagnosis not present

## 2015-07-27 DIAGNOSIS — R300 Dysuria: Secondary | ICD-10-CM | POA: Insufficient documentation

## 2015-07-27 DIAGNOSIS — Z1239 Encounter for other screening for malignant neoplasm of breast: Secondary | ICD-10-CM | POA: Insufficient documentation

## 2015-07-27 DIAGNOSIS — I5032 Chronic diastolic (congestive) heart failure: Secondary | ICD-10-CM

## 2015-07-27 DIAGNOSIS — R059 Cough, unspecified: Secondary | ICD-10-CM | POA: Insufficient documentation

## 2015-07-27 DIAGNOSIS — N186 End stage renal disease: Secondary | ICD-10-CM | POA: Diagnosis not present

## 2015-07-27 DIAGNOSIS — Z794 Long term (current) use of insulin: Secondary | ICD-10-CM | POA: Diagnosis not present

## 2015-07-27 DIAGNOSIS — Z992 Dependence on renal dialysis: Secondary | ICD-10-CM | POA: Diagnosis not present

## 2015-07-27 DIAGNOSIS — N2581 Secondary hyperparathyroidism of renal origin: Secondary | ICD-10-CM | POA: Diagnosis not present

## 2015-07-27 DIAGNOSIS — D631 Anemia in chronic kidney disease: Secondary | ICD-10-CM | POA: Diagnosis not present

## 2015-07-27 DIAGNOSIS — E11319 Type 2 diabetes mellitus with unspecified diabetic retinopathy without macular edema: Secondary | ICD-10-CM

## 2015-07-27 DIAGNOSIS — R05 Cough: Secondary | ICD-10-CM

## 2015-07-27 LAB — CBC AND DIFFERENTIAL
HCT: 33 % — AB (ref 36–46)
HEMOGLOBIN: 10.5 g/dL — AB (ref 12.0–16.0)
NEUTROS ABS: 5387 /uL
PLATELETS: 157 10*3/uL (ref 150–399)
WBC: 7.4 10^3/mL

## 2015-07-27 LAB — BASIC METABOLIC PANEL
Potassium: 4.6 mmol/L (ref 3.4–5.3)
Sodium: 143 mmol/L (ref 137–147)

## 2015-07-27 LAB — HEPATIC FUNCTION PANEL
ALK PHOS: 95 U/L (ref 25–125)
ALT: 24 U/L (ref 7–35)
AST: 24 U/L (ref 13–35)

## 2015-07-27 LAB — HEMOGLOBIN A1C: HEMOGLOBIN A1C: 5.4

## 2015-07-27 NOTE — Progress Notes (Signed)
Subjective:    Patient ID: Theresa [name], female    DOB: 1956-03-03, 60 y.o.   MRN: [id number]  HPI  60YO female presents for follow up.  Some tingling with urination. No fever, chills, flank pain. Makes about 1cup per day of urine.  DM - Few low BG of near 80. Generally well controlled, however did not bring record today.  ESRD. Continues on HD at [facility].  Over last several weeks has had cough productive of thick mucous.  Wt Readings from Last 3 Encounters:  07/27/15 174 lb (78.926 kg)  06/28/15 172 lb (78.019 kg)  05/22/15 173 lb (78.472 kg)   BP Readings from Last 3 Encounters:  07/27/15 140/70  06/28/15 152/72  06/12/15 195/66    Past Medical History  Diagnosis Date  . Hypertension   . ESRD on hemodialysis ([place])     a. [facility] in [place], [place], on a TTS schedule.  She started dialysis in Feb 2014.  Etiology of renal failure not known, likely diabetes.  Has a left upper arm AV graft.  Marland Kitchen COPD (chronic obstructive pulmonary disease) ([place])   . Emphysema   . History of pneumonia     June 2012  . History of bronchitis     Mar 2012  . Peripheral vascular disease ([place])   . Chronic constipation   . Colon polyps   . Sickle cell trait ([place])   . Anemia of chronic disease   . Diabetes mellitus   . Moderate mitral insufficiency     a. 10/2013 Echo: EF 55-60%, mod MR.  . Diabetic neuropathy ([place])   . Diabetic retinopathy ([place]) 05/28/2013    Hx bilat retinal detachment, proliferative diab retinopathy and bilat vitreous hemorrhage   . Hyperlipidemia   . Coronary artery disease     a. 05/2013 NSTEMI/PCI: LM 20d, LAD min irregs, LCX small, nl, OM1 nl, RCA dom 45m (2.5x16 Promus DES), PDA1 80p.  . Chronic diastolic CHF (congestive heart failure) ([place])     a. 10/2013 Echo [facility]): EF 55-60%, mod conc LVH, mod MR, mildly dil LA, mild Ao sclerosis w/o stenosis.  . Carotid arterial disease ([place])     a. 02/2013 U/S: 40-59% bilat ICA stenosis - *f/u 02/2014*  . History of tobacco  abuse     a. Quit 2012.   Family History  Problem Relation Age of Onset  . Anesthesia problems Neg Hx   . Hypotension Neg Hx   . Malignant hyperthermia Neg Hx   . Pseudochol deficiency Neg Hx   . Stroke Mother   . Heart attack Mother   . Heart disease Mother   . Colon cancer Father   . Colon cancer Sister   . Heart attack Brother   . Stroke Brother   . Diabetes Brother   . Breast cancer Sister   . Diabetes Brother   . Diabetes Daughter    Past Surgical History  Procedure Laterality Date  . Abdominal hysterectomy      2000  . Dilation and curettage of uterus      several in the early 80's  . Eye surgery      bilateral laser 2012  . Esophagogastroduodenoscopy      2012  . Colonscopy    . Tubal ligation      1979  . Pars plana vitrectomy  04/22/2011    Procedure: PARS PLANA VITRECTOMY WITH 25 GAUGE;  Surgeon: [name], MD;  Location: [place];  Service: Ophthalmology;  Laterality: Left;  membrane peel, endolaser, gas fluid exchange, silicone oil, repair of complex traction retinal detachment  . Pars plana vitrectomy  09/30/2011    Procedure: PARS PLANA VITRECTOMY WITH 25 GAUGE;  Surgeon: [name], MD;  Location: [place];  Service: Ophthalmology;  Laterality: Right;  Endolaser; Repair of Complex Traction Retinal Detachment  . Gas/fluid exchange  09/30/2011    Procedure: GAS/FLUID EXCHANGE;  Surgeon: [name], MD;  Location: [place];  Service: Ophthalmology;  Laterality: Right;  . Gas insertion  09/30/2011    Procedure: INSERTION OF GAS;  Surgeon: [name], MD;  Location: [place];  Service: Ophthalmology;  Laterality: Right;  C3F8  . Eye surgery      right  . Pars plana vitrectomy  02/24/2012    Procedure: PARS PLANA VITRECTOMY WITH 25 GAUGE;  Surgeon: [name], MD;  Location: [place];  Service: Ophthalmology;  Laterality: Left;  . Silicon oil removal  [id number]    Procedure: SILICON OIL REMOVAL;  Surgeon: [name], MD;  Location: [place];  Service:  Ophthalmology;  Laterality: Left;  . Ptca    . Thrombectomy / arteriovenous graft revision    . Cardiac catheterization    . Coronary angioplasty  05/28/2014    stent placement to the mid RCA  . Left heart catheterization with coronary angiogram N/A 05/28/2013    Procedure: LEFT HEART CATHETERIZATION WITH CORONARY ANGIOGRAM;  Surgeon: [name], MD;  Location: [facility] CATH LAB;  Service: Cardiovascular;  Laterality: N/A;  . Cardiac catheterization N/A 05/02/2015    Procedure: Left Heart Cath and Coronary Angiography;  Surgeon: [name], MD;  Location: [place] CV LAB;  Service: Cardiovascular;  Laterality: N/A;   Social History   Social History  . Marital Status: Divorced    Spouse Name: N/A  . Number of Children: 2  . Years of Education: N/A   Occupational History  . [place] History Main Topics  . Smoking status: Former Smoker -- 1.00 packs/day for 25 years    Quit date: 06/09/2010  . Smokeless tobacco: Never Used  . Alcohol Use: No  . Drug Use: No  . Sexual Activity: No   Other Topics Concern  . None   Social History Narrative    Review of Systems  Constitutional: Negative for fever, chills, appetite change, fatigue and unexpected weight change.  Eyes: Negative for visual disturbance.  Respiratory: Negative for shortness of breath.   Cardiovascular: Negative for chest pain and leg swelling.  Gastrointestinal: Negative for nausea, vomiting, abdominal pain, diarrhea and constipation.  Genitourinary: Positive for dysuria. Negative for urgency, frequency, flank pain, decreased urine volume and pelvic pain.  Musculoskeletal: Negative for myalgias and arthralgias.  Skin: Negative for color change and rash.  Hematological: Negative for adenopathy. Does not bruise/bleed easily.  Psychiatric/Behavioral: Negative for sleep disturbance and dysphoric mood. The patient is not nervous/anxious.        Objective:    BP 140/70 mmHg   Pulse 74  Temp(Src) 98.2 F (36.8 C) (Oral)  Resp 14  Ht 5\' 7"  (1.702 m)  Wt 174 lb (78.926 kg)  BMI 27.25 kg/m2  SpO2 98% Physical Exam  Constitutional: She is oriented to person, place, and time. She appears well-developed and well-nourished. No distress.  HENT:  Head: Normocephalic and atraumatic.  Right Ear: External ear normal.  Left Ear: External ear normal.  Nose: Nose normal.  Mouth/Throat: Oropharynx is clear and moist. No oropharyngeal exudate.  Eyes: Conjunctivae are normal. Pupils are equal, round, and reactive to light. Right eye exhibits no discharge. Left eye exhibits no discharge. No scleral icterus.  Neck: Normal range of motion. Neck supple. No tracheal deviation present. No thyromegaly present.  Cardiovascular: Normal rate, regular rhythm, normal heart sounds and intact distal pulses.  Exam reveals no gallop and no friction rub.   No murmur heard. Pulmonary/Chest: Effort normal and breath sounds normal. No respiratory distress. She has no wheezes. She has no rales. She exhibits no tenderness.  Musculoskeletal: Normal range of motion. She exhibits no edema or tenderness.  Lymphadenopathy:    She has no cervical adenopathy.  Neurological: She is alert and oriented to person, place, and time. No cranial nerve deficit. She exhibits normal muscle tone. Coordination normal.  Skin: Skin is warm and dry. No rash noted. She is not diaphoretic. No erythema. No pallor.  Psychiatric: She has a normal mood and affect. Her behavior is normal. Judgment and thought content normal.          Assessment & Plan:   Problem List Items Addressed This Visit      Unprioritized   Cough    Several weeks of cough. Exam normal today. Suspect allergies with PND. CXR from 04/2015 was normal. Will consider repeat CXR if symptoms persist.      Relevant Orders   DG Chest 2 View   Diabetes mellitus, type II ([place]) - Primary    BG well controlled by report. Will check A1c with labs.        Diabetic retinopathy ([place])    Followed by ophthalmology.      Diastolic heart failure ([facility])    Euvolemic on exam today. Continue current medication.      Dysuria    Mild dysuria. She only makes about 1cup of urine per day. Will send UA and culture.      ESRD (end stage renal disease) ([place])    Continues on HD.       Screening for breast cancer   Relevant Orders   MM Digital Screening       Return in about 3 months (around 10/24/2015) for Recheck.

## 2015-07-27 NOTE — Assessment & Plan Note (Signed)
BG well controlled by report. Will check A1c with labs. 

## 2015-07-27 NOTE — Assessment & Plan Note (Signed)
Followed by ophthalmology.   

## 2015-07-27 NOTE — Assessment & Plan Note (Addendum)
Mild dysuria. She only makes about 1cup of urine per day. Will send UA and culture.

## 2015-07-27 NOTE — Patient Instructions (Signed)
We will request A1c from [place].  We will set up a mammogram.

## 2015-07-27 NOTE — Assessment & Plan Note (Signed)
Euvolemic on exam today. Continue current medication.

## 2015-07-27 NOTE — Assessment & Plan Note (Signed)
Continues on HD.

## 2015-07-27 NOTE — Progress Notes (Signed)
Pre visit review using our clinic review tool, if applicable. No additional management support is needed unless otherwise documented below in the visit note. 

## 2015-07-27 NOTE — Assessment & Plan Note (Signed)
Several weeks of cough. Exam normal today. Suspect allergies with PND. CXR from 04/2015 was normal. Will consider repeat CXR if symptoms persist.

## 2015-07-30 ENCOUNTER — Encounter: Payer: Self-pay | Admitting: *Deleted

## 2015-07-30 DIAGNOSIS — D509 Iron deficiency anemia, unspecified: Secondary | ICD-10-CM | POA: Diagnosis not present

## 2015-07-30 DIAGNOSIS — Z992 Dependence on renal dialysis: Secondary | ICD-10-CM | POA: Diagnosis not present

## 2015-07-30 DIAGNOSIS — N186 End stage renal disease: Secondary | ICD-10-CM | POA: Diagnosis not present

## 2015-07-30 DIAGNOSIS — D631 Anemia in chronic kidney disease: Secondary | ICD-10-CM | POA: Diagnosis not present

## 2015-07-30 DIAGNOSIS — N2581 Secondary hyperparathyroidism of renal origin: Secondary | ICD-10-CM | POA: Diagnosis not present

## 2015-08-01 DIAGNOSIS — Z992 Dependence on renal dialysis: Secondary | ICD-10-CM | POA: Diagnosis not present

## 2015-08-01 DIAGNOSIS — D509 Iron deficiency anemia, unspecified: Secondary | ICD-10-CM | POA: Diagnosis not present

## 2015-08-01 DIAGNOSIS — N186 End stage renal disease: Secondary | ICD-10-CM | POA: Diagnosis not present

## 2015-08-01 DIAGNOSIS — N2581 Secondary hyperparathyroidism of renal origin: Secondary | ICD-10-CM | POA: Diagnosis not present

## 2015-08-01 DIAGNOSIS — D631 Anemia in chronic kidney disease: Secondary | ICD-10-CM | POA: Diagnosis not present

## 2015-08-03 DIAGNOSIS — D509 Iron deficiency anemia, unspecified: Secondary | ICD-10-CM | POA: Diagnosis not present

## 2015-08-03 DIAGNOSIS — N2581 Secondary hyperparathyroidism of renal origin: Secondary | ICD-10-CM | POA: Diagnosis not present

## 2015-08-03 DIAGNOSIS — Z992 Dependence on renal dialysis: Secondary | ICD-10-CM | POA: Diagnosis not present

## 2015-08-03 DIAGNOSIS — N186 End stage renal disease: Secondary | ICD-10-CM | POA: Diagnosis not present

## 2015-08-03 DIAGNOSIS — D631 Anemia in chronic kidney disease: Secondary | ICD-10-CM | POA: Diagnosis not present

## 2015-08-06 DIAGNOSIS — D631 Anemia in chronic kidney disease: Secondary | ICD-10-CM | POA: Diagnosis not present

## 2015-08-06 DIAGNOSIS — N186 End stage renal disease: Secondary | ICD-10-CM | POA: Diagnosis not present

## 2015-08-06 DIAGNOSIS — D509 Iron deficiency anemia, unspecified: Secondary | ICD-10-CM | POA: Diagnosis not present

## 2015-08-06 DIAGNOSIS — Z992 Dependence on renal dialysis: Secondary | ICD-10-CM | POA: Diagnosis not present

## 2015-08-06 DIAGNOSIS — N2581 Secondary hyperparathyroidism of renal origin: Secondary | ICD-10-CM | POA: Diagnosis not present

## 2015-08-08 ENCOUNTER — Other Ambulatory Visit: Payer: Self-pay | Admitting: Cardiovascular Disease

## 2015-08-08 DIAGNOSIS — N2581 Secondary hyperparathyroidism of renal origin: Secondary | ICD-10-CM | POA: Diagnosis not present

## 2015-08-08 DIAGNOSIS — N186 End stage renal disease: Secondary | ICD-10-CM | POA: Diagnosis not present

## 2015-08-08 DIAGNOSIS — D631 Anemia in chronic kidney disease: Secondary | ICD-10-CM | POA: Diagnosis not present

## 2015-08-08 DIAGNOSIS — Z992 Dependence on renal dialysis: Secondary | ICD-10-CM | POA: Diagnosis not present

## 2015-08-08 DIAGNOSIS — D509 Iron deficiency anemia, unspecified: Secondary | ICD-10-CM | POA: Diagnosis not present

## 2015-08-10 DIAGNOSIS — D631 Anemia in chronic kidney disease: Secondary | ICD-10-CM | POA: Diagnosis not present

## 2015-08-10 DIAGNOSIS — N2581 Secondary hyperparathyroidism of renal origin: Secondary | ICD-10-CM | POA: Diagnosis not present

## 2015-08-10 DIAGNOSIS — Z992 Dependence on renal dialysis: Secondary | ICD-10-CM | POA: Diagnosis not present

## 2015-08-10 DIAGNOSIS — D509 Iron deficiency anemia, unspecified: Secondary | ICD-10-CM | POA: Diagnosis not present

## 2015-08-10 DIAGNOSIS — N186 End stage renal disease: Secondary | ICD-10-CM | POA: Diagnosis not present

## 2015-08-13 DIAGNOSIS — D509 Iron deficiency anemia, unspecified: Secondary | ICD-10-CM | POA: Diagnosis not present

## 2015-08-13 DIAGNOSIS — N186 End stage renal disease: Secondary | ICD-10-CM | POA: Diagnosis not present

## 2015-08-13 DIAGNOSIS — N2581 Secondary hyperparathyroidism of renal origin: Secondary | ICD-10-CM | POA: Diagnosis not present

## 2015-08-13 DIAGNOSIS — D631 Anemia in chronic kidney disease: Secondary | ICD-10-CM | POA: Diagnosis not present

## 2015-08-13 DIAGNOSIS — Z992 Dependence on renal dialysis: Secondary | ICD-10-CM | POA: Diagnosis not present

## 2015-08-15 DIAGNOSIS — D631 Anemia in chronic kidney disease: Secondary | ICD-10-CM | POA: Diagnosis not present

## 2015-08-15 DIAGNOSIS — N186 End stage renal disease: Secondary | ICD-10-CM | POA: Diagnosis not present

## 2015-08-15 DIAGNOSIS — Z992 Dependence on renal dialysis: Secondary | ICD-10-CM | POA: Diagnosis not present

## 2015-08-15 DIAGNOSIS — N2581 Secondary hyperparathyroidism of renal origin: Secondary | ICD-10-CM | POA: Diagnosis not present

## 2015-08-15 DIAGNOSIS — D509 Iron deficiency anemia, unspecified: Secondary | ICD-10-CM | POA: Diagnosis not present

## 2015-08-16 ENCOUNTER — Encounter: Payer: Self-pay | Admitting: Internal Medicine

## 2015-08-16 DIAGNOSIS — M5136 Other intervertebral disc degeneration, lumbar region: Secondary | ICD-10-CM | POA: Diagnosis not present

## 2015-08-16 DIAGNOSIS — M5416 Radiculopathy, lumbar region: Secondary | ICD-10-CM | POA: Diagnosis not present

## 2015-08-17 DIAGNOSIS — D509 Iron deficiency anemia, unspecified: Secondary | ICD-10-CM | POA: Diagnosis not present

## 2015-08-17 DIAGNOSIS — N2581 Secondary hyperparathyroidism of renal origin: Secondary | ICD-10-CM | POA: Diagnosis not present

## 2015-08-17 DIAGNOSIS — N186 End stage renal disease: Secondary | ICD-10-CM | POA: Diagnosis not present

## 2015-08-17 DIAGNOSIS — Z992 Dependence on renal dialysis: Secondary | ICD-10-CM | POA: Diagnosis not present

## 2015-08-17 DIAGNOSIS — D631 Anemia in chronic kidney disease: Secondary | ICD-10-CM | POA: Diagnosis not present

## 2015-08-20 DIAGNOSIS — D631 Anemia in chronic kidney disease: Secondary | ICD-10-CM | POA: Diagnosis not present

## 2015-08-20 DIAGNOSIS — D509 Iron deficiency anemia, unspecified: Secondary | ICD-10-CM | POA: Diagnosis not present

## 2015-08-20 DIAGNOSIS — N186 End stage renal disease: Secondary | ICD-10-CM | POA: Diagnosis not present

## 2015-08-20 DIAGNOSIS — N2581 Secondary hyperparathyroidism of renal origin: Secondary | ICD-10-CM | POA: Diagnosis not present

## 2015-08-20 DIAGNOSIS — Z992 Dependence on renal dialysis: Secondary | ICD-10-CM | POA: Diagnosis not present

## 2015-08-22 DIAGNOSIS — D631 Anemia in chronic kidney disease: Secondary | ICD-10-CM | POA: Diagnosis not present

## 2015-08-22 DIAGNOSIS — N186 End stage renal disease: Secondary | ICD-10-CM | POA: Diagnosis not present

## 2015-08-22 DIAGNOSIS — Z992 Dependence on renal dialysis: Secondary | ICD-10-CM | POA: Diagnosis not present

## 2015-08-22 DIAGNOSIS — D509 Iron deficiency anemia, unspecified: Secondary | ICD-10-CM | POA: Diagnosis not present

## 2015-08-22 DIAGNOSIS — N2581 Secondary hyperparathyroidism of renal origin: Secondary | ICD-10-CM | POA: Diagnosis not present

## 2015-08-24 DIAGNOSIS — D509 Iron deficiency anemia, unspecified: Secondary | ICD-10-CM | POA: Diagnosis not present

## 2015-08-24 DIAGNOSIS — N186 End stage renal disease: Secondary | ICD-10-CM | POA: Diagnosis not present

## 2015-08-24 DIAGNOSIS — D631 Anemia in chronic kidney disease: Secondary | ICD-10-CM | POA: Diagnosis not present

## 2015-08-24 DIAGNOSIS — N2581 Secondary hyperparathyroidism of renal origin: Secondary | ICD-10-CM | POA: Diagnosis not present

## 2015-08-24 DIAGNOSIS — Z992 Dependence on renal dialysis: Secondary | ICD-10-CM | POA: Diagnosis not present

## 2015-08-27 DIAGNOSIS — Z992 Dependence on renal dialysis: Secondary | ICD-10-CM | POA: Diagnosis not present

## 2015-08-27 DIAGNOSIS — D509 Iron deficiency anemia, unspecified: Secondary | ICD-10-CM | POA: Diagnosis not present

## 2015-08-27 DIAGNOSIS — D631 Anemia in chronic kidney disease: Secondary | ICD-10-CM | POA: Diagnosis not present

## 2015-08-27 DIAGNOSIS — N186 End stage renal disease: Secondary | ICD-10-CM | POA: Diagnosis not present

## 2015-08-27 DIAGNOSIS — N2581 Secondary hyperparathyroidism of renal origin: Secondary | ICD-10-CM | POA: Diagnosis not present

## 2015-08-28 DIAGNOSIS — E11319 Type 2 diabetes mellitus with unspecified diabetic retinopathy without macular edema: Secondary | ICD-10-CM | POA: Diagnosis not present

## 2015-08-28 DIAGNOSIS — E1139 Type 2 diabetes mellitus with other diabetic ophthalmic complication: Secondary | ICD-10-CM | POA: Diagnosis not present

## 2015-08-28 DIAGNOSIS — M5416 Radiculopathy, lumbar region: Secondary | ICD-10-CM | POA: Diagnosis not present

## 2015-08-28 DIAGNOSIS — M5136 Other intervertebral disc degeneration, lumbar region: Secondary | ICD-10-CM | POA: Diagnosis not present

## 2015-08-29 DIAGNOSIS — D631 Anemia in chronic kidney disease: Secondary | ICD-10-CM | POA: Diagnosis not present

## 2015-08-29 DIAGNOSIS — D509 Iron deficiency anemia, unspecified: Secondary | ICD-10-CM | POA: Diagnosis not present

## 2015-08-29 DIAGNOSIS — N2581 Secondary hyperparathyroidism of renal origin: Secondary | ICD-10-CM | POA: Diagnosis not present

## 2015-08-29 DIAGNOSIS — Z992 Dependence on renal dialysis: Secondary | ICD-10-CM | POA: Diagnosis not present

## 2015-08-29 DIAGNOSIS — N186 End stage renal disease: Secondary | ICD-10-CM | POA: Diagnosis not present

## 2015-08-31 DIAGNOSIS — Z992 Dependence on renal dialysis: Secondary | ICD-10-CM | POA: Diagnosis not present

## 2015-08-31 DIAGNOSIS — N186 End stage renal disease: Secondary | ICD-10-CM | POA: Diagnosis not present

## 2015-08-31 DIAGNOSIS — D631 Anemia in chronic kidney disease: Secondary | ICD-10-CM | POA: Diagnosis not present

## 2015-08-31 DIAGNOSIS — D509 Iron deficiency anemia, unspecified: Secondary | ICD-10-CM | POA: Diagnosis not present

## 2015-08-31 DIAGNOSIS — N2581 Secondary hyperparathyroidism of renal origin: Secondary | ICD-10-CM | POA: Diagnosis not present

## 2015-09-03 DIAGNOSIS — D509 Iron deficiency anemia, unspecified: Secondary | ICD-10-CM | POA: Diagnosis not present

## 2015-09-03 DIAGNOSIS — D631 Anemia in chronic kidney disease: Secondary | ICD-10-CM | POA: Diagnosis not present

## 2015-09-03 DIAGNOSIS — Z992 Dependence on renal dialysis: Secondary | ICD-10-CM | POA: Diagnosis not present

## 2015-09-03 DIAGNOSIS — N186 End stage renal disease: Secondary | ICD-10-CM | POA: Diagnosis not present

## 2015-09-03 DIAGNOSIS — N2581 Secondary hyperparathyroidism of renal origin: Secondary | ICD-10-CM | POA: Diagnosis not present

## 2015-09-05 DIAGNOSIS — Z992 Dependence on renal dialysis: Secondary | ICD-10-CM | POA: Diagnosis not present

## 2015-09-05 DIAGNOSIS — N186 End stage renal disease: Secondary | ICD-10-CM | POA: Diagnosis not present

## 2015-09-05 DIAGNOSIS — D631 Anemia in chronic kidney disease: Secondary | ICD-10-CM | POA: Diagnosis not present

## 2015-09-05 DIAGNOSIS — D509 Iron deficiency anemia, unspecified: Secondary | ICD-10-CM | POA: Diagnosis not present

## 2015-09-05 DIAGNOSIS — N2581 Secondary hyperparathyroidism of renal origin: Secondary | ICD-10-CM | POA: Diagnosis not present

## 2015-09-07 DIAGNOSIS — D509 Iron deficiency anemia, unspecified: Secondary | ICD-10-CM | POA: Diagnosis not present

## 2015-09-07 DIAGNOSIS — Z992 Dependence on renal dialysis: Secondary | ICD-10-CM | POA: Diagnosis not present

## 2015-09-07 DIAGNOSIS — N186 End stage renal disease: Secondary | ICD-10-CM | POA: Diagnosis not present

## 2015-09-07 DIAGNOSIS — D631 Anemia in chronic kidney disease: Secondary | ICD-10-CM | POA: Diagnosis not present

## 2015-09-07 DIAGNOSIS — N2581 Secondary hyperparathyroidism of renal origin: Secondary | ICD-10-CM | POA: Diagnosis not present

## 2015-09-10 DIAGNOSIS — D509 Iron deficiency anemia, unspecified: Secondary | ICD-10-CM | POA: Diagnosis not present

## 2015-09-10 DIAGNOSIS — N2581 Secondary hyperparathyroidism of renal origin: Secondary | ICD-10-CM | POA: Diagnosis not present

## 2015-09-10 DIAGNOSIS — D631 Anemia in chronic kidney disease: Secondary | ICD-10-CM | POA: Diagnosis not present

## 2015-09-10 DIAGNOSIS — Z992 Dependence on renal dialysis: Secondary | ICD-10-CM | POA: Diagnosis not present

## 2015-09-10 DIAGNOSIS — N186 End stage renal disease: Secondary | ICD-10-CM | POA: Diagnosis not present

## 2015-09-12 DIAGNOSIS — Z992 Dependence on renal dialysis: Secondary | ICD-10-CM | POA: Diagnosis not present

## 2015-09-12 DIAGNOSIS — D631 Anemia in chronic kidney disease: Secondary | ICD-10-CM | POA: Diagnosis not present

## 2015-09-12 DIAGNOSIS — N2581 Secondary hyperparathyroidism of renal origin: Secondary | ICD-10-CM | POA: Diagnosis not present

## 2015-09-12 DIAGNOSIS — D509 Iron deficiency anemia, unspecified: Secondary | ICD-10-CM | POA: Diagnosis not present

## 2015-09-12 DIAGNOSIS — N186 End stage renal disease: Secondary | ICD-10-CM | POA: Diagnosis not present

## 2015-09-14 DIAGNOSIS — D631 Anemia in chronic kidney disease: Secondary | ICD-10-CM | POA: Diagnosis not present

## 2015-09-14 DIAGNOSIS — Z992 Dependence on renal dialysis: Secondary | ICD-10-CM | POA: Diagnosis not present

## 2015-09-14 DIAGNOSIS — N186 End stage renal disease: Secondary | ICD-10-CM | POA: Diagnosis not present

## 2015-09-14 DIAGNOSIS — N2581 Secondary hyperparathyroidism of renal origin: Secondary | ICD-10-CM | POA: Diagnosis not present

## 2015-09-14 DIAGNOSIS — D509 Iron deficiency anemia, unspecified: Secondary | ICD-10-CM | POA: Diagnosis not present

## 2015-09-17 DIAGNOSIS — Z992 Dependence on renal dialysis: Secondary | ICD-10-CM | POA: Diagnosis not present

## 2015-09-17 DIAGNOSIS — D631 Anemia in chronic kidney disease: Secondary | ICD-10-CM | POA: Diagnosis not present

## 2015-09-17 DIAGNOSIS — N2581 Secondary hyperparathyroidism of renal origin: Secondary | ICD-10-CM | POA: Diagnosis not present

## 2015-09-17 DIAGNOSIS — D509 Iron deficiency anemia, unspecified: Secondary | ICD-10-CM | POA: Diagnosis not present

## 2015-09-17 DIAGNOSIS — N186 End stage renal disease: Secondary | ICD-10-CM | POA: Diagnosis not present

## 2015-09-18 DIAGNOSIS — M5416 Radiculopathy, lumbar region: Secondary | ICD-10-CM | POA: Diagnosis not present

## 2015-09-18 DIAGNOSIS — M5136 Other intervertebral disc degeneration, lumbar region: Secondary | ICD-10-CM | POA: Diagnosis not present

## 2015-09-19 DIAGNOSIS — D631 Anemia in chronic kidney disease: Secondary | ICD-10-CM | POA: Diagnosis not present

## 2015-09-19 DIAGNOSIS — Z992 Dependence on renal dialysis: Secondary | ICD-10-CM | POA: Diagnosis not present

## 2015-09-19 DIAGNOSIS — N2581 Secondary hyperparathyroidism of renal origin: Secondary | ICD-10-CM | POA: Diagnosis not present

## 2015-09-19 DIAGNOSIS — N186 End stage renal disease: Secondary | ICD-10-CM | POA: Diagnosis not present

## 2015-09-19 DIAGNOSIS — D509 Iron deficiency anemia, unspecified: Secondary | ICD-10-CM | POA: Diagnosis not present

## 2015-09-21 DIAGNOSIS — N186 End stage renal disease: Secondary | ICD-10-CM | POA: Diagnosis not present

## 2015-09-21 DIAGNOSIS — N2581 Secondary hyperparathyroidism of renal origin: Secondary | ICD-10-CM | POA: Diagnosis not present

## 2015-09-21 DIAGNOSIS — D509 Iron deficiency anemia, unspecified: Secondary | ICD-10-CM | POA: Diagnosis not present

## 2015-09-21 DIAGNOSIS — D631 Anemia in chronic kidney disease: Secondary | ICD-10-CM | POA: Diagnosis not present

## 2015-09-21 DIAGNOSIS — Z992 Dependence on renal dialysis: Secondary | ICD-10-CM | POA: Diagnosis not present

## 2015-09-24 DIAGNOSIS — N2581 Secondary hyperparathyroidism of renal origin: Secondary | ICD-10-CM | POA: Diagnosis not present

## 2015-09-24 DIAGNOSIS — D631 Anemia in chronic kidney disease: Secondary | ICD-10-CM | POA: Diagnosis not present

## 2015-09-24 DIAGNOSIS — Z992 Dependence on renal dialysis: Secondary | ICD-10-CM | POA: Diagnosis not present

## 2015-09-24 DIAGNOSIS — N186 End stage renal disease: Secondary | ICD-10-CM | POA: Diagnosis not present

## 2015-09-24 DIAGNOSIS — D509 Iron deficiency anemia, unspecified: Secondary | ICD-10-CM | POA: Diagnosis not present

## 2015-09-26 ENCOUNTER — Encounter: Payer: Self-pay | Admitting: Family

## 2015-09-26 ENCOUNTER — Ambulatory Visit: Payer: Medicare Other | Attending: Family | Admitting: Family

## 2015-09-26 VITALS — BP 178/60 | HR 71 | Resp 18 | Ht 67.0 in | Wt 175.0 lb

## 2015-09-26 DIAGNOSIS — K5909 Other constipation: Secondary | ICD-10-CM | POA: Diagnosis not present

## 2015-09-26 DIAGNOSIS — Z823 Family history of stroke: Secondary | ICD-10-CM | POA: Insufficient documentation

## 2015-09-26 DIAGNOSIS — H338 Other retinal detachments: Secondary | ICD-10-CM | POA: Diagnosis not present

## 2015-09-26 DIAGNOSIS — Z7682 Awaiting organ transplant status: Secondary | ICD-10-CM | POA: Diagnosis not present

## 2015-09-26 DIAGNOSIS — I120 Hypertensive chronic kidney disease with stage 5 chronic kidney disease or end stage renal disease: Secondary | ICD-10-CM | POA: Insufficient documentation

## 2015-09-26 DIAGNOSIS — I252 Old myocardial infarction: Secondary | ICD-10-CM | POA: Diagnosis not present

## 2015-09-26 DIAGNOSIS — Z9889 Other specified postprocedural states: Secondary | ICD-10-CM | POA: Insufficient documentation

## 2015-09-26 DIAGNOSIS — Z79899 Other long term (current) drug therapy: Secondary | ICD-10-CM | POA: Diagnosis not present

## 2015-09-26 DIAGNOSIS — Z9981 Dependence on supplemental oxygen: Secondary | ICD-10-CM | POA: Diagnosis not present

## 2015-09-26 DIAGNOSIS — I739 Peripheral vascular disease, unspecified: Secondary | ICD-10-CM | POA: Diagnosis not present

## 2015-09-26 DIAGNOSIS — D509 Iron deficiency anemia, unspecified: Secondary | ICD-10-CM | POA: Diagnosis not present

## 2015-09-26 DIAGNOSIS — N2581 Secondary hyperparathyroidism of renal origin: Secondary | ICD-10-CM | POA: Diagnosis not present

## 2015-09-26 DIAGNOSIS — D631 Anemia in chronic kidney disease: Secondary | ICD-10-CM | POA: Diagnosis not present

## 2015-09-26 DIAGNOSIS — Z803 Family history of malignant neoplasm of breast: Secondary | ICD-10-CM | POA: Diagnosis not present

## 2015-09-26 DIAGNOSIS — Z8249 Family history of ischemic heart disease and other diseases of the circulatory system: Secondary | ICD-10-CM | POA: Diagnosis not present

## 2015-09-26 DIAGNOSIS — J449 Chronic obstructive pulmonary disease, unspecified: Secondary | ICD-10-CM | POA: Insufficient documentation

## 2015-09-26 DIAGNOSIS — Z885 Allergy status to narcotic agent status: Secondary | ICD-10-CM | POA: Insufficient documentation

## 2015-09-26 DIAGNOSIS — Z87891 Personal history of nicotine dependence: Secondary | ICD-10-CM | POA: Diagnosis not present

## 2015-09-26 DIAGNOSIS — I110 Hypertensive heart disease with heart failure: Secondary | ICD-10-CM | POA: Insufficient documentation

## 2015-09-26 DIAGNOSIS — Z8701 Personal history of pneumonia (recurrent): Secondary | ICD-10-CM | POA: Insufficient documentation

## 2015-09-26 DIAGNOSIS — Z992 Dependence on renal dialysis: Secondary | ICD-10-CM | POA: Diagnosis not present

## 2015-09-26 DIAGNOSIS — D573 Sickle-cell trait: Secondary | ICD-10-CM | POA: Diagnosis not present

## 2015-09-26 DIAGNOSIS — Z8489 Family history of other specified conditions: Secondary | ICD-10-CM | POA: Insufficient documentation

## 2015-09-26 DIAGNOSIS — Z7982 Long term (current) use of aspirin: Secondary | ICD-10-CM | POA: Insufficient documentation

## 2015-09-26 DIAGNOSIS — Z90710 Acquired absence of both cervix and uterus: Secondary | ICD-10-CM | POA: Diagnosis not present

## 2015-09-26 DIAGNOSIS — Z86010 Personal history of colonic polyps: Secondary | ICD-10-CM | POA: Diagnosis not present

## 2015-09-26 DIAGNOSIS — R5383 Other fatigue: Secondary | ICD-10-CM | POA: Diagnosis not present

## 2015-09-26 DIAGNOSIS — N186 End stage renal disease: Secondary | ICD-10-CM

## 2015-09-26 DIAGNOSIS — J439 Emphysema, unspecified: Secondary | ICD-10-CM | POA: Diagnosis not present

## 2015-09-26 DIAGNOSIS — I5032 Chronic diastolic (congestive) heart failure: Secondary | ICD-10-CM | POA: Diagnosis not present

## 2015-09-26 DIAGNOSIS — Z808 Family history of malignant neoplasm of other organs or systems: Secondary | ICD-10-CM | POA: Diagnosis not present

## 2015-09-26 DIAGNOSIS — J45909 Unspecified asthma, uncomplicated: Secondary | ICD-10-CM | POA: Diagnosis not present

## 2015-09-26 DIAGNOSIS — I10 Essential (primary) hypertension: Secondary | ICD-10-CM

## 2015-09-26 DIAGNOSIS — Z7951 Long term (current) use of inhaled steroids: Secondary | ICD-10-CM | POA: Diagnosis not present

## 2015-09-26 DIAGNOSIS — Z888 Allergy status to other drugs, medicaments and biological substances status: Secondary | ICD-10-CM | POA: Insufficient documentation

## 2015-09-26 DIAGNOSIS — I340 Nonrheumatic mitral (valve) insufficiency: Secondary | ICD-10-CM | POA: Insufficient documentation

## 2015-09-26 DIAGNOSIS — E1122 Type 2 diabetes mellitus with diabetic chronic kidney disease: Secondary | ICD-10-CM

## 2015-09-26 DIAGNOSIS — I2510 Atherosclerotic heart disease of native coronary artery without angina pectoris: Secondary | ICD-10-CM | POA: Insufficient documentation

## 2015-09-26 DIAGNOSIS — G451 Carotid artery syndrome (hemispheric): Secondary | ICD-10-CM | POA: Insufficient documentation

## 2015-09-26 DIAGNOSIS — Z794 Long term (current) use of insulin: Secondary | ICD-10-CM | POA: Diagnosis not present

## 2015-09-26 MED ORDER — NITROGLYCERIN 0.4 MG SL SUBL: 0.4000 mg | SUBLINGUAL_TABLET | SUBLINGUAL | Status: DC | PRN

## 2015-09-26 NOTE — Progress Notes (Signed)
Subjective:    Patient ID: Theresa [name], female    DOB: 18-Jun-1955, 60 y.o.   MRN: [id number]  Congestive Heart Failure Presents for follow-up visit. The disease course has been improving. Associated symptoms include fatigue. Pertinent negatives include no abdominal pain, chest pain, chest pressure, edema, orthopnea, palpitations or shortness of breath. The symptoms have been improving. Past treatments include beta blockers and salt and fluid restriction. The treatment provided significant relief. Compliance with prior treatments has been good. Her past medical history is significant for anemia, CAD, chronic lung disease, DM and HTN. She has multiple 1st degree relatives with heart disease.  Hypertension This is a chronic problem. The current episode started more than 1 year ago. The problem has been waxing and waning since onset. Associated symptoms include headaches (at times in the back of the head). Pertinent negatives include no chest pain, neck pain, palpitations, peripheral edema or shortness of breath. There are no associated agents to hypertension. Risk factors for coronary artery disease include diabetes mellitus, dyslipidemia and family history. Past treatments include alpha 1 blockers, beta blockers, diuretics and lifestyle changes. The current treatment provides moderate improvement. There are no compliance problems.  Hypertensive end-organ damage includes kidney disease, CAD/MI, heart failure and PVD.   Past Medical History  Diagnosis Date  . Hypertension   . ESRD on hemodialysis ([place])     a. [facility] in [place], [place], on a TTS schedule.  She started dialysis in Feb 2014.  Etiology of renal failure not known, likely diabetes.  Has a left upper arm AV graft.  Marland Kitchen COPD (chronic obstructive pulmonary disease) ([place])   . Emphysema   . History of pneumonia     June 2012  . History of bronchitis     Mar 2012  . Peripheral vascular disease ([place])   . Chronic constipation   . Colon polyps    . Sickle cell trait ([place])   . Anemia of chronic disease   . Diabetes mellitus   . Moderate mitral insufficiency     a. 10/2013 Echo: EF 55-60%, mod MR.  . Diabetic neuropathy ([place])   . Diabetic retinopathy ([place]) 05/28/2013    Hx bilat retinal detachment, proliferative diab retinopathy and bilat vitreous hemorrhage   . Hyperlipidemia   . Coronary artery disease     a. 05/2013 NSTEMI/PCI: LM 20d, LAD min irregs, LCX small, nl, OM1 nl, RCA dom 15m (2.5x16 Promus DES), PDA1 80p.  . Chronic diastolic CHF (congestive heart failure) ([place])     a. 10/2013 Echo [facility] EF 55-60%, mod conc LVH, mod MR, mildly dil LA, mild Ao sclerosis w/o stenosis.  . Carotid arterial disease ([place])     a. 02/2013 U/S: 40-59% bilat ICA stenosis - *f/u 02/2014*  . History of tobacco abuse     a. Quit 2012.    Past Surgical History  Procedure Laterality Date  . Abdominal hysterectomy      2000  . Dilation and curettage of uterus      several in the early 80's  . Eye surgery      bilateral laser 2012  . Esophagogastroduodenoscopy      2012  . Colonscopy    . Tubal ligation      1979  . Pars plana vitrectomy  04/22/2011    Procedure: PARS PLANA VITRECTOMY WITH 25 GAUGE;  Surgeon: [name], MD;  Location: [place];  Service: Ophthalmology;  Laterality: Left;  membrane peel, endolaser, gas fluid exchange, silicone oil, repair  of complex traction retinal detachment  . Pars plana vitrectomy  09/30/2011    Procedure: PARS PLANA VITRECTOMY WITH 25 GAUGE;  Surgeon: [name], MD;  Location: [place];  Service: Ophthalmology;  Laterality: Right;  Endolaser; Repair of Complex Traction Retinal Detachment  . Gas/fluid exchange  09/30/2011    Procedure: GAS/FLUID EXCHANGE;  Surgeon: [name], MD;  Location: [place];  Service: Ophthalmology;  Laterality: Right;  . Gas insertion  09/30/2011    Procedure: INSERTION OF GAS;  Surgeon: [name], MD;  Location: [place];  Service: Ophthalmology;  Laterality: Right;   C3F8  . Eye surgery      right  . Pars plana vitrectomy  02/24/2012    Procedure: PARS PLANA VITRECTOMY WITH 25 GAUGE;  Surgeon: [name], MD;  Location: [place];  Service: Ophthalmology;  Laterality: Left;  . Silicon oil removal  [id number]    Procedure: SILICON OIL REMOVAL;  Surgeon: [name], MD;  Location: [place];  Service: Ophthalmology;  Laterality: Left;  . Ptca    . Thrombectomy / arteriovenous graft revision    . Cardiac catheterization    . Coronary angioplasty  05/28/2014    stent placement to the mid RCA  . Left heart catheterization with coronary angiogram N/A 05/28/2013    Procedure: LEFT HEART CATHETERIZATION WITH CORONARY ANGIOGRAM;  Surgeon: [name], MD;  Location: [facility] CATH LAB;  Service: Cardiovascular;  Laterality: N/A;  . Cardiac catheterization N/A 05/02/2015    Procedure: Left Heart Cath and Coronary Angiography;  Surgeon: [name], MD;  Location: [place] CV LAB;  Service: Cardiovascular;  Laterality: N/A;    Family History  Problem Relation Age of Onset  . Anesthesia problems Neg Hx   . Hypotension Neg Hx   . Malignant hyperthermia Neg Hx   . Pseudochol deficiency Neg Hx   . Stroke Mother   . Heart attack Mother   . Heart disease Mother   . Colon cancer Father   . Colon cancer Sister   . Heart attack Brother   . Stroke Brother   . Diabetes Brother   . Breast cancer Sister   . Diabetes Brother   . Diabetes Daughter     Social History  Substance Use Topics  . Smoking status: Former Smoker -- 1.00 packs/day for 25 years    Quit date: 06/09/2010  . Smokeless tobacco: Never Used  . Alcohol Use: No    Allergies  Allergen Reactions  . Metformin Diarrhea  . Hydrocodone Hives    Prior to Admission medications   Medication Sig Start Date End Date Taking? Authorizing Provider  albuterol (PROVENTIL HFA;VENTOLIN HFA) 108 (90 BASE) MCG/ACT inhaler Inhale 2 puffs into the lungs every 6 (six) hours as needed for wheezing or  shortness of breath.   Yes Historical Provider, MD  albuterol (PROVENTIL) (2.5 MG/3ML) 0.083% nebulizer solution Take 2.5 mg by nebulization every 4 (four) hours as needed for wheezing or shortness of breath.   Yes Historical Provider, MD  aspirin EC 81 MG tablet Take 81 mg by mouth daily.   Yes Historical Provider, MD  calcium acetate (PHOSLO) 667 MG capsule Take 2,668 mg by mouth 3 (three) times daily with meals.    Yes Historical Provider, MD  carvedilol (COREG) 12.5 MG tablet Take 1 tablet (12.5 mg total) by mouth 2 (two) times daily. 06/28/15  Yes Alisa Graff, FNP  cloNIDine (CATAPRES) 0.1 MG tablet Take 1 tablet (0.1 mg  total) by mouth 2 (two) times daily. 01/30/15  Yes Minna Merritts, MD  CRESTOR 40 MG tablet TAKE ONE TABLET BY MOUTH ONCE DAILY 08/09/15  Yes Minna Merritts, MD  furosemide (LASIX) 80 MG tablet Take 80 mg by mouth daily.    Yes Historical Provider, MD  Insulin Detemir (LEVEMIR) 100 UNIT/ML Pen Inject 5 Units into the skin daily. 06/28/15  Yes Alisa Graff, FNP  multivitamin (RENA-VIT) TABS tablet Take 1 tablet by mouth daily.   Yes Historical Provider, MD  nitroGLYCERIN (NITROSTAT) 0.4 MG SL tablet Place 1 tablet (0.4 mg total) under the tongue every 5 (five) minutes as needed for chest pain. 09/26/15  Yes Alisa Graff, FNP  oxyCODONE-acetaminophen (ROXICET) 5-325 MG tablet Take 1 tablet by mouth every 6 (six) hours as needed for severe pain. 06/12/15  Yes Duanne Guess, PA-C  pregabalin (LYRICA) 75 MG capsule Take 1 capsule (75 mg total) by mouth daily. 03/28/14  Yes Jackolyn Confer, MD      Review of Systems  Constitutional: Positive for fatigue. Negative for appetite change.  HENT: Positive for congestion. Negative for postnasal drip and sore throat.   Eyes: Negative.   Respiratory: Negative for cough, chest tightness, shortness of breath and wheezing.   Cardiovascular: Negative for chest pain, palpitations and leg swelling.  Gastrointestinal: Negative for  abdominal pain and abdominal distention.  Endocrine: Negative.   Musculoskeletal: Positive for back pain and arthralgias (right hip at times). Negative for neck pain.  Skin: Negative.   Allergic/Immunologic: Negative.   Neurological: Positive for headaches (at times in the back of the head). Negative for dizziness and light-headedness.  Hematological: Negative for adenopathy. Does not bruise/bleed easily.  Psychiatric/Behavioral: Positive for sleep disturbance (sleeping on 1 pillow; difficulty staying asleep). Negative for dysphoric mood. The patient is not nervous/anxious.        Objective:   Physical Exam  Constitutional: She is oriented to person, place, and time. She appears well-developed and well-nourished.  HENT:  Head: Normocephalic and atraumatic.  Eyes: Conjunctivae are normal. Pupils are equal, round, and reactive to light.  Neck: Normal range of motion. Neck supple.  Cardiovascular: Normal rate and regular rhythm.   Pulmonary/Chest: Effort normal. She has no wheezes. She has no rales.  Abdominal: Soft. She exhibits no distension. There is no tenderness.  Musculoskeletal: She exhibits no edema or tenderness.  Neurological: She is alert and oriented to person, place, and time.  Skin: Skin is warm and dry.  Psychiatric: She has a normal mood and affect. Her behavior is normal. Thought content normal.  Nursing note and vitals reviewed.   BP 178/60 mmHg  Pulse 71  Resp 18  Ht 5\' 7"  (1.702 m)  Wt 175 lb (79.379 kg)  BMI 27.40 kg/m2  SpO2 100%       Assessment & Plan:  1: Chronic heart failure with preserved ejection fraction- Patient presents with fatigue upon exertion. When she does feel tired, she says that she'll stop what she's doing to rest until her energy level improves. She denies any shortness of breath or swelling in her legs or abdomen. She has oxygen at home that she can wear as needed but she says that she hasn't worn it in awhile. She continues to weigh  herself and says that her weight has been stable. By our scale, she has gained 3 pounds since she was last here in January 2017. Reminded to call for an overnight weight gain of >2 pounds or  a weekly weight gain of >5 pounds. She is not adding any salt to her food and tries to eat low sodium foods.  2: HTN- Blood pressure high on initial reading but she says that she just found out that her [employer] had termed and she didn't realize it. Was starting to come down on subsequent reading. She says that her blood pressure has been good at dialysis and when she checks it at home. 3: Diabetes- She says that her most recent A1c was 5.6%. She continues to use insulin and follows up with her PCP about this. She says that her mouth gets quite dry and she was advised that she could use sugar free candy to suck on.  4: ESRD- Patient goes to dialysis on M, W, F afternoons. She is currently on the kidney transplant list.   Medication bottles were reviewed with the patient.  Return here in 3 months or sooner for any questions/problems before then.

## 2015-09-26 NOTE — Patient Instructions (Signed)
Continue weighing daily and call for an overnight weight gain of > 2 pounds or a weekly weight gain of >5 pounds. 

## 2015-09-27 ENCOUNTER — Ambulatory Visit: Payer: Medicare Other | Admitting: Family

## 2015-09-28 DIAGNOSIS — Z992 Dependence on renal dialysis: Secondary | ICD-10-CM | POA: Diagnosis not present

## 2015-09-28 DIAGNOSIS — D631 Anemia in chronic kidney disease: Secondary | ICD-10-CM | POA: Diagnosis not present

## 2015-09-28 DIAGNOSIS — D509 Iron deficiency anemia, unspecified: Secondary | ICD-10-CM | POA: Diagnosis not present

## 2015-09-28 DIAGNOSIS — N186 End stage renal disease: Secondary | ICD-10-CM | POA: Diagnosis not present

## 2015-09-28 DIAGNOSIS — N2581 Secondary hyperparathyroidism of renal origin: Secondary | ICD-10-CM | POA: Diagnosis not present

## 2015-10-01 DIAGNOSIS — N2581 Secondary hyperparathyroidism of renal origin: Secondary | ICD-10-CM | POA: Diagnosis not present

## 2015-10-01 DIAGNOSIS — D631 Anemia in chronic kidney disease: Secondary | ICD-10-CM | POA: Diagnosis not present

## 2015-10-01 DIAGNOSIS — D509 Iron deficiency anemia, unspecified: Secondary | ICD-10-CM | POA: Diagnosis not present

## 2015-10-01 DIAGNOSIS — N186 End stage renal disease: Secondary | ICD-10-CM | POA: Diagnosis not present

## 2015-10-01 DIAGNOSIS — Z992 Dependence on renal dialysis: Secondary | ICD-10-CM | POA: Diagnosis not present

## 2015-10-03 DIAGNOSIS — D509 Iron deficiency anemia, unspecified: Secondary | ICD-10-CM | POA: Diagnosis not present

## 2015-10-03 DIAGNOSIS — N2581 Secondary hyperparathyroidism of renal origin: Secondary | ICD-10-CM | POA: Diagnosis not present

## 2015-10-03 DIAGNOSIS — N186 End stage renal disease: Secondary | ICD-10-CM | POA: Diagnosis not present

## 2015-10-03 DIAGNOSIS — Z992 Dependence on renal dialysis: Secondary | ICD-10-CM | POA: Diagnosis not present

## 2015-10-03 DIAGNOSIS — D631 Anemia in chronic kidney disease: Secondary | ICD-10-CM | POA: Diagnosis not present

## 2015-10-05 ENCOUNTER — Encounter: Payer: Self-pay | Admitting: Sports Medicine

## 2015-10-05 ENCOUNTER — Ambulatory Visit (INDEPENDENT_AMBULATORY_CARE_PROVIDER_SITE_OTHER): Payer: Medicare Other | Admitting: Sports Medicine

## 2015-10-05 DIAGNOSIS — D631 Anemia in chronic kidney disease: Secondary | ICD-10-CM | POA: Diagnosis not present

## 2015-10-05 DIAGNOSIS — M79676 Pain in unspecified toe(s): Secondary | ICD-10-CM | POA: Diagnosis not present

## 2015-10-05 DIAGNOSIS — N2581 Secondary hyperparathyroidism of renal origin: Secondary | ICD-10-CM | POA: Diagnosis not present

## 2015-10-05 DIAGNOSIS — E1140 Type 2 diabetes mellitus with diabetic neuropathy, unspecified: Secondary | ICD-10-CM

## 2015-10-05 DIAGNOSIS — D509 Iron deficiency anemia, unspecified: Secondary | ICD-10-CM | POA: Diagnosis not present

## 2015-10-05 DIAGNOSIS — N186 End stage renal disease: Secondary | ICD-10-CM | POA: Diagnosis not present

## 2015-10-05 DIAGNOSIS — B351 Tinea unguium: Secondary | ICD-10-CM

## 2015-10-05 DIAGNOSIS — Z992 Dependence on renal dialysis: Secondary | ICD-10-CM | POA: Diagnosis not present

## 2015-10-05 NOTE — Progress Notes (Signed)
Patient ID: Theresa [name], female   DOB: 1956-04-10, 60 y.o.   MRN: [id number]  Subjective: Theresa [name] is a 60 y.o. female patient with history of type 2 diabetes who presents to office today complaining of long, painful nails  while ambulating in shoes; unable to trim. Patient states that the glucose reading was 107 mg/dl. Patient denies any new changes in medication or new problems. Patient denies any new cramping, numbness, burning or tingling in the legs.  A1c 5.6  Patient Active Problem List   Diagnosis Date Noted  . Screening for breast cancer 07/27/2015  . Dysuria 07/27/2015  . Diastolic heart failure ([place]) 05/07/2015  . Cephalalgia   . S/P coronary artery stent placement   . ESRD (end stage renal disease) ([place]) 05/02/2015  . Neuritis or radiculitis due to rupture of lumbar intervertebral disc 11/28/2014  . Right hip pain 06/13/2014  . Routine general medical examination at a health care facility 09/02/2013  . Osteoarthritis of right hip 08/23/2013  . DDD (degenerative disc disease), lumbosacral 08/23/2013  . Coronary artery disease 06/27/2013  . Diabetic retinopathy ([place]) 05/28/2013  . Hyperlipidemia   . Essential (primary) hypertension 11/08/2012  . Diabetic neuropathy ([place]) 02/18/2012  . Anemia in chronic kidney disease (CKD) 02/18/2012  . ESRD on hemodialysis ([place]) 08/21/2011  . Diabetes mellitus, type II ([place]) 06/30/2011  . COPD (chronic obstructive pulmonary disease) ([place]) 06/13/2011   Current Outpatient Prescriptions on File Prior to Visit  Medication Sig Dispense Refill  . albuterol (PROVENTIL HFA;VENTOLIN HFA) 108 (90 BASE) MCG/ACT inhaler Inhale 2 puffs into the lungs every 6 (six) hours as needed for wheezing or shortness of breath.    Marland Kitchen albuterol (PROVENTIL) (2.5 MG/3ML) 0.083% nebulizer solution Take 2.5 mg by nebulization every 4 (four) hours as needed for wheezing or shortness of breath.    Marland Kitchen aspirin EC 81 MG tablet Take 81 mg by mouth daily.    . calcium  acetate (PHOSLO) 667 MG capsule Take 2,668 mg by mouth 3 (three) times daily with meals.     . carvedilol (COREG) 12.5 MG tablet Take 1 tablet (12.5 mg total) by mouth 2 (two) times daily. 60 tablet 6  . cloNIDine (CATAPRES) 0.1 MG tablet Take 1 tablet (0.1 mg total) by mouth 2 (two) times daily. 60 tablet 11  . CRESTOR 40 MG tablet TAKE ONE TABLET BY MOUTH ONCE DAILY 90 tablet 3  . furosemide (LASIX) 80 MG tablet Take 80 mg by mouth daily.     . Insulin Detemir (LEVEMIR) 100 UNIT/ML Pen Inject 5 Units into the skin daily. 15 mL 6  . multivitamin (RENA-VIT) TABS tablet Take 1 tablet by mouth daily.    . nitroGLYCERIN (NITROSTAT) 0.4 MG SL tablet Place 1 tablet (0.4 mg total) under the tongue every 5 (five) minutes as needed for chest pain. 25 tablet 3  . oxyCODONE-acetaminophen (ROXICET) 5-325 MG tablet Take 1 tablet by mouth every 6 (six) hours as needed for severe pain. 15 tablet 0  . pregabalin (LYRICA) 75 MG capsule Take 1 capsule (75 mg total) by mouth daily. 30 capsule 3   No current facility-administered medications on file prior to visit.   Allergies  Allergen Reactions  . Metformin Diarrhea  . Hydrocodone Hives    Objective: General: Patient is awake, alert, and oriented x 3 and in no acute distress.  Integument: Skin is warm, dry and supple bilateral. Nails are tender, long, thickened and  dystrophic with subungual debris, consistent with onychomycosis, 1-5  bilateral. No signs of infection. No open lesions or preulcerative lesions present bilateral. Remaining integument unremarkable.  Vasculature:  Dorsalis Pedis pulse 2/4 bilateral. Posterior Tibial pulse  1/4 bilateral.  Capillary fill time <3 sec 1-5 bilateral. Scant hair growth to the level of the digits. Temperature gradient within normal limits. No varicosities present bilateral. No edema present bilateral.   Neurology: The patient has intact sensation measured with a 5.07/10g Semmes Weinstein Monofilament at all pedal  sites bilateral . Vibratory sensation diminished bilateral with tuning fork. No Babinski sign present bilateral.   Musculoskeletal: Mild asymptomatic hammertoes bilateral. Muscular strength 5/5 in all lower extremity muscular groups bilateral without pain or limitation on range of motion . No tenderness with calf compression bilateral.  Assessment and Plan: Problem List Items Addressed This Visit    None    Visit Diagnoses    Dermatophytosis of nail    -  Primary    Pain of toe, unspecified laterality        Type 2 diabetes mellitus with diabetic neuropathy, unspecified long term insulin use status ([place])          -Examined patient. -Discussed and educated patient on diabetic foot care, especially with  regards to the vascular, neurological and musculoskeletal systems.  -Stressed the importance of good glycemic control and the detriment of not  controlling glucose levels in relation to the foot. -Mechanically debrided all nails 1-5 bilateral using sterile nail nipper and filed with dremel without incident  -Answered all patient questions -Patient to return  in 3 months for at risk foot care -Patient advised to call the office if any problems or questions arise in the meantime.  Theresa [name], DPM

## 2015-10-07 DIAGNOSIS — Z992 Dependence on renal dialysis: Secondary | ICD-10-CM | POA: Diagnosis not present

## 2015-10-07 DIAGNOSIS — N186 End stage renal disease: Secondary | ICD-10-CM | POA: Diagnosis not present

## 2015-10-08 DIAGNOSIS — N2581 Secondary hyperparathyroidism of renal origin: Secondary | ICD-10-CM | POA: Diagnosis not present

## 2015-10-08 DIAGNOSIS — D509 Iron deficiency anemia, unspecified: Secondary | ICD-10-CM | POA: Diagnosis not present

## 2015-10-08 DIAGNOSIS — N186 End stage renal disease: Secondary | ICD-10-CM | POA: Diagnosis not present

## 2015-10-08 DIAGNOSIS — D631 Anemia in chronic kidney disease: Secondary | ICD-10-CM | POA: Diagnosis not present

## 2015-10-08 DIAGNOSIS — Z992 Dependence on renal dialysis: Secondary | ICD-10-CM | POA: Diagnosis not present

## 2015-10-10 DIAGNOSIS — Z992 Dependence on renal dialysis: Secondary | ICD-10-CM | POA: Diagnosis not present

## 2015-10-10 DIAGNOSIS — D509 Iron deficiency anemia, unspecified: Secondary | ICD-10-CM | POA: Diagnosis not present

## 2015-10-10 DIAGNOSIS — N2581 Secondary hyperparathyroidism of renal origin: Secondary | ICD-10-CM | POA: Diagnosis not present

## 2015-10-10 DIAGNOSIS — N186 End stage renal disease: Secondary | ICD-10-CM | POA: Diagnosis not present

## 2015-10-10 DIAGNOSIS — D631 Anemia in chronic kidney disease: Secondary | ICD-10-CM | POA: Diagnosis not present

## 2015-10-12 DIAGNOSIS — Z992 Dependence on renal dialysis: Secondary | ICD-10-CM | POA: Diagnosis not present

## 2015-10-12 DIAGNOSIS — N186 End stage renal disease: Secondary | ICD-10-CM | POA: Diagnosis not present

## 2015-10-12 DIAGNOSIS — D509 Iron deficiency anemia, unspecified: Secondary | ICD-10-CM | POA: Diagnosis not present

## 2015-10-12 DIAGNOSIS — D631 Anemia in chronic kidney disease: Secondary | ICD-10-CM | POA: Diagnosis not present

## 2015-10-12 DIAGNOSIS — N2581 Secondary hyperparathyroidism of renal origin: Secondary | ICD-10-CM | POA: Diagnosis not present

## 2015-10-15 DIAGNOSIS — N2581 Secondary hyperparathyroidism of renal origin: Secondary | ICD-10-CM | POA: Diagnosis not present

## 2015-10-15 DIAGNOSIS — D509 Iron deficiency anemia, unspecified: Secondary | ICD-10-CM | POA: Diagnosis not present

## 2015-10-15 DIAGNOSIS — N186 End stage renal disease: Secondary | ICD-10-CM | POA: Diagnosis not present

## 2015-10-15 DIAGNOSIS — Z992 Dependence on renal dialysis: Secondary | ICD-10-CM | POA: Diagnosis not present

## 2015-10-15 DIAGNOSIS — D631 Anemia in chronic kidney disease: Secondary | ICD-10-CM | POA: Diagnosis not present

## 2015-10-17 DIAGNOSIS — D631 Anemia in chronic kidney disease: Secondary | ICD-10-CM | POA: Diagnosis not present

## 2015-10-17 DIAGNOSIS — Z992 Dependence on renal dialysis: Secondary | ICD-10-CM | POA: Diagnosis not present

## 2015-10-17 DIAGNOSIS — N186 End stage renal disease: Secondary | ICD-10-CM | POA: Diagnosis not present

## 2015-10-17 DIAGNOSIS — D509 Iron deficiency anemia, unspecified: Secondary | ICD-10-CM | POA: Diagnosis not present

## 2015-10-17 DIAGNOSIS — N2581 Secondary hyperparathyroidism of renal origin: Secondary | ICD-10-CM | POA: Diagnosis not present

## 2015-10-19 DIAGNOSIS — Z992 Dependence on renal dialysis: Secondary | ICD-10-CM | POA: Diagnosis not present

## 2015-10-19 DIAGNOSIS — N2581 Secondary hyperparathyroidism of renal origin: Secondary | ICD-10-CM | POA: Diagnosis not present

## 2015-10-19 DIAGNOSIS — D509 Iron deficiency anemia, unspecified: Secondary | ICD-10-CM | POA: Diagnosis not present

## 2015-10-19 DIAGNOSIS — N186 End stage renal disease: Secondary | ICD-10-CM | POA: Diagnosis not present

## 2015-10-19 DIAGNOSIS — D631 Anemia in chronic kidney disease: Secondary | ICD-10-CM | POA: Diagnosis not present

## 2015-10-22 DIAGNOSIS — N2581 Secondary hyperparathyroidism of renal origin: Secondary | ICD-10-CM | POA: Diagnosis not present

## 2015-10-22 DIAGNOSIS — N186 End stage renal disease: Secondary | ICD-10-CM | POA: Diagnosis not present

## 2015-10-22 DIAGNOSIS — D509 Iron deficiency anemia, unspecified: Secondary | ICD-10-CM | POA: Diagnosis not present

## 2015-10-22 DIAGNOSIS — D631 Anemia in chronic kidney disease: Secondary | ICD-10-CM | POA: Diagnosis not present

## 2015-10-22 DIAGNOSIS — Z992 Dependence on renal dialysis: Secondary | ICD-10-CM | POA: Diagnosis not present

## 2015-10-24 DIAGNOSIS — D631 Anemia in chronic kidney disease: Secondary | ICD-10-CM | POA: Diagnosis not present

## 2015-10-24 DIAGNOSIS — N186 End stage renal disease: Secondary | ICD-10-CM | POA: Diagnosis not present

## 2015-10-24 DIAGNOSIS — D509 Iron deficiency anemia, unspecified: Secondary | ICD-10-CM | POA: Diagnosis not present

## 2015-10-24 DIAGNOSIS — N2581 Secondary hyperparathyroidism of renal origin: Secondary | ICD-10-CM | POA: Diagnosis not present

## 2015-10-24 DIAGNOSIS — Z992 Dependence on renal dialysis: Secondary | ICD-10-CM | POA: Diagnosis not present

## 2015-10-26 ENCOUNTER — Ambulatory Visit (INDEPENDENT_AMBULATORY_CARE_PROVIDER_SITE_OTHER): Payer: Medicare Other | Admitting: Internal Medicine

## 2015-10-26 ENCOUNTER — Encounter: Payer: Self-pay | Admitting: Internal Medicine

## 2015-10-26 VITALS — BP 132/68 | HR 64 | Ht 67.0 in | Wt 174.8 lb

## 2015-10-26 DIAGNOSIS — I10 Essential (primary) hypertension: Secondary | ICD-10-CM

## 2015-10-26 DIAGNOSIS — R0683 Snoring: Secondary | ICD-10-CM | POA: Diagnosis not present

## 2015-10-26 DIAGNOSIS — Z794 Long term (current) use of insulin: Secondary | ICD-10-CM | POA: Diagnosis not present

## 2015-10-26 DIAGNOSIS — N2581 Secondary hyperparathyroidism of renal origin: Secondary | ICD-10-CM | POA: Diagnosis not present

## 2015-10-26 DIAGNOSIS — E1122 Type 2 diabetes mellitus with diabetic chronic kidney disease: Secondary | ICD-10-CM | POA: Diagnosis not present

## 2015-10-26 DIAGNOSIS — D509 Iron deficiency anemia, unspecified: Secondary | ICD-10-CM | POA: Diagnosis not present

## 2015-10-26 DIAGNOSIS — N186 End stage renal disease: Secondary | ICD-10-CM | POA: Diagnosis not present

## 2015-10-26 DIAGNOSIS — Z992 Dependence on renal dialysis: Secondary | ICD-10-CM | POA: Diagnosis not present

## 2015-10-26 DIAGNOSIS — J449 Chronic obstructive pulmonary disease, unspecified: Secondary | ICD-10-CM

## 2015-10-26 DIAGNOSIS — D631 Anemia in chronic kidney disease: Secondary | ICD-10-CM | POA: Diagnosis not present

## 2015-10-26 NOTE — Assessment & Plan Note (Signed)
Symptomatically, doing well. Will continue prn Albuterol.

## 2015-10-26 NOTE — Assessment & Plan Note (Signed)
On transplant list. HD MWV. Tolerating well. Will get labs with HD.

## 2015-10-26 NOTE — Patient Instructions (Signed)
We will set up sleep study.  Follow up in 3 months.

## 2015-10-26 NOTE — Assessment & Plan Note (Signed)
BG well controlled by report. Will check A1c with labs at HD. Continue Levemir.

## 2015-10-26 NOTE — Assessment & Plan Note (Signed)
BP Readings from Last 3 Encounters:  10/26/15 132/68  09/26/15 178/60  07/27/15 140/70   BP well controlled. Continue current medications.

## 2015-10-26 NOTE — Progress Notes (Signed)
Pre visit review using our clinic review tool, if applicable. No additional management support is needed unless otherwise documented below in the visit note. 

## 2015-10-26 NOTE — Progress Notes (Signed)
Subjective:    Patient ID: Theresa [name], female    DOB: [birth year], 58 y.o.   MRN: [id number]  HPI  60YO female presents for follow up.  Was on oxygen in past, first 24/7 then at night. Now using only at night. Not recently wearing. Notes that she snores at night. Sometimes gasping.  DM - BG have been near 80-100.  ESRD on HD - Going to HD on MWF. No issues with access. On machine 3h and 68min.   Wt Readings from Last 3 Encounters:  10/26/15 174 lb 12.8 oz (79.289 kg)  09/26/15 175 lb (79.379 kg)  07/27/15 174 lb (78.926 kg)   BP Readings from Last 3 Encounters:  10/26/15 132/68  09/26/15 178/60  07/27/15 140/70    Past Medical History  Diagnosis Date  . Hypertension   . ESRD on hemodialysis ([place])     a. [facility] in [place], [place], on a TTS schedule.  She started dialysis in Feb 2014.  Etiology of renal failure not known, likely diabetes.  Has a left upper arm AV graft.  Marland Kitchen COPD (chronic obstructive pulmonary disease) ([place])   . Emphysema   . History of pneumonia     June 2012  . History of bronchitis     Mar 2012  . Peripheral vascular disease ([place])   . Chronic constipation   . Colon polyps   . Sickle cell trait ([place])   . Anemia of chronic disease   . Diabetes mellitus   . Moderate mitral insufficiency     a. 10/2013 Echo: EF 55-60%, mod MR.  . Diabetic neuropathy ([place])   . Diabetic retinopathy ([place]) 05/28/2013    Hx bilat retinal detachment, proliferative diab retinopathy and bilat vitreous hemorrhage   . Hyperlipidemia   . Coronary artery disease     a. 05/2013 NSTEMI/PCI: LM 20d, LAD min irregs, LCX small, nl, OM1 nl, RCA dom 35m (2.5x16 Promus DES), PDA1 80p.  . Chronic diastolic CHF (congestive heart failure) ([place])     a. 10/2013 Echo [facility]): EF 55-60%, mod conc LVH, mod MR, mildly dil LA, mild Ao sclerosis w/o stenosis.  . Carotid arterial disease ([place])     a. 02/2013 U/S: 40-59% bilat ICA stenosis - *f/u 02/2014*  . History of tobacco abuse     a. Quit  2012.   Family History  Problem Relation Age of Onset  . Anesthesia problems Neg Hx   . Hypotension Neg Hx   . Malignant hyperthermia Neg Hx   . Pseudochol deficiency Neg Hx   . Stroke Mother   . Heart attack Mother   . Heart disease Mother   . Colon cancer Father   . Colon cancer Sister   . Heart attack Brother   . Stroke Brother   . Diabetes Brother   . Breast cancer Sister   . Diabetes Brother   . Diabetes Daughter    Past Surgical History  Procedure Laterality Date  . Abdominal hysterectomy      2000  . Dilation and curettage of uterus      several in the early 80's  . Eye surgery      bilateral laser 2012  . Esophagogastroduodenoscopy      2012  . Colonscopy    . Tubal ligation      1979  . Pars plana vitrectomy  04/22/2011    Procedure: PARS PLANA VITRECTOMY WITH 25 GAUGE;  Surgeon: [name], MD;  Location: [place];  Service: Ophthalmology;  Laterality: Left;  membrane peel, endolaser, gas fluid exchange, silicone oil, repair of complex traction retinal detachment  . Pars plana vitrectomy  09/30/2011    Procedure: PARS PLANA VITRECTOMY WITH 25 GAUGE;  Surgeon: [name], MD;  Location: [place];  Service: Ophthalmology;  Laterality: Right;  Endolaser; Repair of Complex Traction Retinal Detachment  . Gas/fluid exchange  09/30/2011    Procedure: GAS/FLUID EXCHANGE;  Surgeon: [name], MD;  Location: [place];  Service: Ophthalmology;  Laterality: Right;  . Gas insertion  09/30/2011    Procedure: INSERTION OF GAS;  Surgeon: [name], MD;  Location: [place];  Service: Ophthalmology;  Laterality: Right;  C3F8  . Eye surgery      right  . Pars plana vitrectomy  02/24/2012    Procedure: PARS PLANA VITRECTOMY WITH 25 GAUGE;  Surgeon: [name], MD;  Location: [place];  Service: Ophthalmology;  Laterality: Left;  . Silicon oil removal  [id number]    Procedure: SILICON OIL REMOVAL;  Surgeon: [name], MD;  Location: [place];  Service: Ophthalmology;   Laterality: Left;  . Ptca    . Thrombectomy / arteriovenous graft revision    . Cardiac catheterization    . Coronary angioplasty  05/28/2014    stent placement to the mid RCA  . Left heart catheterization with coronary angiogram N/A 05/28/2013    Procedure: LEFT HEART CATHETERIZATION WITH CORONARY ANGIOGRAM;  Surgeon: [name], MD;  Location: [facility] CATH LAB;  Service: Cardiovascular;  Laterality: N/A;  . Cardiac catheterization N/A 05/02/2015    Procedure: Left Heart Cath and Coronary Angiography;  Surgeon: [name], MD;  Location: [place] CV LAB;  Service: Cardiovascular;  Laterality: N/A;   Social History   Social History  . Marital Status: Divorced    Spouse Name: N/A  . Number of Children: 2  . Years of Education: N/A   Occupational History  . [place] History Main Topics  . Smoking status: Former Smoker -- 1.00 packs/day for 25 years    Quit date: 06/09/2010  . Smokeless tobacco: Never Used  . Alcohol Use: No  . Drug Use: No  . Sexual Activity: No   Other Topics Concern  . None   Social History Narrative    Review of Systems  Constitutional: Negative for fever, chills, appetite change, fatigue and unexpected weight change.  Eyes: Negative for visual disturbance.  Respiratory: Negative for apnea, cough, chest tightness, shortness of breath and wheezing.   Cardiovascular: Negative for chest pain and leg swelling.  Gastrointestinal: Negative for nausea, vomiting, abdominal pain, diarrhea and constipation.  Musculoskeletal: Negative for myalgias and arthralgias.  Skin: Negative for color change and rash.  Hematological: Negative for adenopathy. Does not bruise/bleed easily.  Psychiatric/Behavioral: Negative for sleep disturbance and dysphoric mood. The patient is not nervous/anxious.        Objective:    BP 132/68 mmHg  Pulse 64  Ht 5\' 7"  (1.702 m)  Wt 174 lb 12.8 oz (79.289 kg)  BMI 27.37 kg/m2  SpO2 94% Physical  Exam  Constitutional: She is oriented to person, place, and time. She appears well-developed and well-nourished. No distress.  HENT:  Head: Normocephalic and atraumatic.  Right Ear: External ear normal.  Left Ear: External ear normal.  Nose: Nose normal.  Mouth/Throat: Oropharynx is clear and moist. No oropharyngeal exudate.  Eyes: Conjunctivae are normal. Pupils are equal, round, and reactive to light. Right eye exhibits  no discharge. Left eye exhibits no discharge. No scleral icterus.  Neck: Normal range of motion. Neck supple. No tracheal deviation present. No thyromegaly present.  Cardiovascular: Normal rate, regular rhythm, normal heart sounds and intact distal pulses.  Exam reveals no gallop and no friction rub.   No murmur heard. Pulmonary/Chest: Effort normal and breath sounds normal. No respiratory distress. She has no wheezes. She has no rales. She exhibits no tenderness.  Musculoskeletal: Normal range of motion. She exhibits no edema or tenderness.  Lymphadenopathy:    She has no cervical adenopathy.  Neurological: She is alert and oriented to person, place, and time. No cranial nerve deficit. She exhibits normal muscle tone. Coordination normal.  Skin: Skin is warm and dry. No rash noted. She is not diaphoretic. No erythema. No pallor.  Psychiatric: She has a normal mood and affect. Her behavior is normal. Judgment and thought content normal.          Assessment & Plan:   Problem List Items Addressed This Visit      Unprioritized   COPD (chronic obstructive pulmonary disease) (HCC) (Chronic)    Symptomatically, doing well. Will continue prn Albuterol.      Diabetes mellitus, type II ([place]) - Primary (Chronic)    BG well controlled by report. Will check A1c with labs at HD. Continue Levemir.      ESRD on hemodialysis ([place]) (Chronic)    On transplant list. HD MWV. Tolerating well. Will get labs with HD.      Essential (primary) hypertension    BP Readings from Last  3 Encounters:  10/26/15 132/68  09/26/15 178/60  07/27/15 140/70   BP well controlled. Continue current medications.       Other Visit Diagnoses    Snoring        Relevant Orders    Ambulatory referral to Sleep Studies        Return in about 3 months (around 01/26/2016) for Recheck.  Ronette Deter, MD Internal Medicine [facility]

## 2015-10-29 DIAGNOSIS — Z992 Dependence on renal dialysis: Secondary | ICD-10-CM | POA: Diagnosis not present

## 2015-10-29 DIAGNOSIS — N186 End stage renal disease: Secondary | ICD-10-CM | POA: Diagnosis not present

## 2015-10-29 DIAGNOSIS — M5416 Radiculopathy, lumbar region: Secondary | ICD-10-CM | POA: Diagnosis not present

## 2015-10-29 DIAGNOSIS — D631 Anemia in chronic kidney disease: Secondary | ICD-10-CM | POA: Diagnosis not present

## 2015-10-29 DIAGNOSIS — D509 Iron deficiency anemia, unspecified: Secondary | ICD-10-CM | POA: Diagnosis not present

## 2015-10-29 DIAGNOSIS — M5136 Other intervertebral disc degeneration, lumbar region: Secondary | ICD-10-CM | POA: Diagnosis not present

## 2015-10-29 DIAGNOSIS — N2581 Secondary hyperparathyroidism of renal origin: Secondary | ICD-10-CM | POA: Diagnosis not present

## 2015-10-30 DIAGNOSIS — G4733 Obstructive sleep apnea (adult) (pediatric): Secondary | ICD-10-CM | POA: Diagnosis not present

## 2015-10-31 DIAGNOSIS — N186 End stage renal disease: Secondary | ICD-10-CM | POA: Diagnosis not present

## 2015-10-31 DIAGNOSIS — D631 Anemia in chronic kidney disease: Secondary | ICD-10-CM | POA: Diagnosis not present

## 2015-10-31 DIAGNOSIS — N2581 Secondary hyperparathyroidism of renal origin: Secondary | ICD-10-CM | POA: Diagnosis not present

## 2015-10-31 DIAGNOSIS — Z992 Dependence on renal dialysis: Secondary | ICD-10-CM | POA: Diagnosis not present

## 2015-10-31 DIAGNOSIS — D509 Iron deficiency anemia, unspecified: Secondary | ICD-10-CM | POA: Diagnosis not present

## 2015-11-01 DIAGNOSIS — G4733 Obstructive sleep apnea (adult) (pediatric): Secondary | ICD-10-CM | POA: Diagnosis not present

## 2015-11-02 DIAGNOSIS — N2581 Secondary hyperparathyroidism of renal origin: Secondary | ICD-10-CM | POA: Diagnosis not present

## 2015-11-02 DIAGNOSIS — N186 End stage renal disease: Secondary | ICD-10-CM | POA: Diagnosis not present

## 2015-11-02 DIAGNOSIS — D509 Iron deficiency anemia, unspecified: Secondary | ICD-10-CM | POA: Diagnosis not present

## 2015-11-02 DIAGNOSIS — D631 Anemia in chronic kidney disease: Secondary | ICD-10-CM | POA: Diagnosis not present

## 2015-11-02 DIAGNOSIS — Z992 Dependence on renal dialysis: Secondary | ICD-10-CM | POA: Diagnosis not present

## 2015-11-05 DIAGNOSIS — N186 End stage renal disease: Secondary | ICD-10-CM | POA: Diagnosis not present

## 2015-11-05 DIAGNOSIS — Z992 Dependence on renal dialysis: Secondary | ICD-10-CM | POA: Diagnosis not present

## 2015-11-05 DIAGNOSIS — D509 Iron deficiency anemia, unspecified: Secondary | ICD-10-CM | POA: Diagnosis not present

## 2015-11-05 DIAGNOSIS — D631 Anemia in chronic kidney disease: Secondary | ICD-10-CM | POA: Diagnosis not present

## 2015-11-05 DIAGNOSIS — N2581 Secondary hyperparathyroidism of renal origin: Secondary | ICD-10-CM | POA: Diagnosis not present

## 2015-11-07 DIAGNOSIS — D509 Iron deficiency anemia, unspecified: Secondary | ICD-10-CM | POA: Diagnosis not present

## 2015-11-07 DIAGNOSIS — N2581 Secondary hyperparathyroidism of renal origin: Secondary | ICD-10-CM | POA: Diagnosis not present

## 2015-11-07 DIAGNOSIS — Z992 Dependence on renal dialysis: Secondary | ICD-10-CM | POA: Diagnosis not present

## 2015-11-07 DIAGNOSIS — D631 Anemia in chronic kidney disease: Secondary | ICD-10-CM | POA: Diagnosis not present

## 2015-11-07 DIAGNOSIS — N186 End stage renal disease: Secondary | ICD-10-CM | POA: Diagnosis not present

## 2015-11-09 DIAGNOSIS — N2581 Secondary hyperparathyroidism of renal origin: Secondary | ICD-10-CM | POA: Diagnosis not present

## 2015-11-09 DIAGNOSIS — D631 Anemia in chronic kidney disease: Secondary | ICD-10-CM | POA: Diagnosis not present

## 2015-11-09 DIAGNOSIS — Z992 Dependence on renal dialysis: Secondary | ICD-10-CM | POA: Diagnosis not present

## 2015-11-09 DIAGNOSIS — D509 Iron deficiency anemia, unspecified: Secondary | ICD-10-CM | POA: Diagnosis not present

## 2015-11-09 DIAGNOSIS — N186 End stage renal disease: Secondary | ICD-10-CM | POA: Diagnosis not present

## 2015-11-12 DIAGNOSIS — D631 Anemia in chronic kidney disease: Secondary | ICD-10-CM | POA: Diagnosis not present

## 2015-11-12 DIAGNOSIS — N186 End stage renal disease: Secondary | ICD-10-CM | POA: Diagnosis not present

## 2015-11-12 DIAGNOSIS — D509 Iron deficiency anemia, unspecified: Secondary | ICD-10-CM | POA: Diagnosis not present

## 2015-11-12 DIAGNOSIS — N2581 Secondary hyperparathyroidism of renal origin: Secondary | ICD-10-CM | POA: Diagnosis not present

## 2015-11-12 DIAGNOSIS — Z992 Dependence on renal dialysis: Secondary | ICD-10-CM | POA: Diagnosis not present

## 2015-11-14 DIAGNOSIS — N186 End stage renal disease: Secondary | ICD-10-CM | POA: Diagnosis not present

## 2015-11-14 DIAGNOSIS — D509 Iron deficiency anemia, unspecified: Secondary | ICD-10-CM | POA: Diagnosis not present

## 2015-11-14 DIAGNOSIS — D631 Anemia in chronic kidney disease: Secondary | ICD-10-CM | POA: Diagnosis not present

## 2015-11-14 DIAGNOSIS — Z992 Dependence on renal dialysis: Secondary | ICD-10-CM | POA: Diagnosis not present

## 2015-11-14 DIAGNOSIS — N2581 Secondary hyperparathyroidism of renal origin: Secondary | ICD-10-CM | POA: Diagnosis not present

## 2015-11-16 DIAGNOSIS — D631 Anemia in chronic kidney disease: Secondary | ICD-10-CM | POA: Diagnosis not present

## 2015-11-16 DIAGNOSIS — N186 End stage renal disease: Secondary | ICD-10-CM | POA: Diagnosis not present

## 2015-11-16 DIAGNOSIS — N2581 Secondary hyperparathyroidism of renal origin: Secondary | ICD-10-CM | POA: Diagnosis not present

## 2015-11-16 DIAGNOSIS — D509 Iron deficiency anemia, unspecified: Secondary | ICD-10-CM | POA: Diagnosis not present

## 2015-11-16 DIAGNOSIS — Z992 Dependence on renal dialysis: Secondary | ICD-10-CM | POA: Diagnosis not present

## 2015-11-19 DIAGNOSIS — N186 End stage renal disease: Secondary | ICD-10-CM | POA: Diagnosis not present

## 2015-11-19 DIAGNOSIS — E119 Type 2 diabetes mellitus without complications: Secondary | ICD-10-CM | POA: Diagnosis not present

## 2015-11-19 DIAGNOSIS — N2581 Secondary hyperparathyroidism of renal origin: Secondary | ICD-10-CM | POA: Diagnosis not present

## 2015-11-19 DIAGNOSIS — Z794 Long term (current) use of insulin: Secondary | ICD-10-CM | POA: Diagnosis not present

## 2015-11-19 DIAGNOSIS — D631 Anemia in chronic kidney disease: Secondary | ICD-10-CM | POA: Diagnosis not present

## 2015-11-19 DIAGNOSIS — Z992 Dependence on renal dialysis: Secondary | ICD-10-CM | POA: Diagnosis not present

## 2015-11-19 DIAGNOSIS — D509 Iron deficiency anemia, unspecified: Secondary | ICD-10-CM | POA: Diagnosis not present

## 2015-11-21 ENCOUNTER — Telehealth: Payer: Self-pay | Admitting: Internal Medicine

## 2015-11-21 DIAGNOSIS — N2581 Secondary hyperparathyroidism of renal origin: Secondary | ICD-10-CM | POA: Diagnosis not present

## 2015-11-21 DIAGNOSIS — J449 Chronic obstructive pulmonary disease, unspecified: Secondary | ICD-10-CM

## 2015-11-21 DIAGNOSIS — Z992 Dependence on renal dialysis: Secondary | ICD-10-CM | POA: Diagnosis not present

## 2015-11-21 DIAGNOSIS — G4733 Obstructive sleep apnea (adult) (pediatric): Secondary | ICD-10-CM

## 2015-11-21 DIAGNOSIS — D509 Iron deficiency anemia, unspecified: Secondary | ICD-10-CM | POA: Diagnosis not present

## 2015-11-21 DIAGNOSIS — D631 Anemia in chronic kidney disease: Secondary | ICD-10-CM | POA: Diagnosis not present

## 2015-11-21 DIAGNOSIS — N186 End stage renal disease: Secondary | ICD-10-CM | POA: Diagnosis not present

## 2015-11-21 DIAGNOSIS — I25111 Atherosclerotic heart disease of native coronary artery with angina pectoris with documented spasm: Secondary | ICD-10-CM

## 2015-11-21 NOTE — Telephone Encounter (Signed)
She needs to continue the CPAP. We can fax over orders to [facility] to continue nasal CPAP 10cmH2O

## 2015-11-21 NOTE — Telephone Encounter (Signed)
Patient is aware.  Order placed and hard copy faxed.

## 2015-11-21 NOTE — Telephone Encounter (Signed)
Pt called stating she had her sleep study was about three weeks ago and wants to know if Dr Gilford Rile still wants her on the oxygen, If it is order needs to be faxed to [facility]. Fax number is [phone]. Please call pt with what is decided @ [phone]. Thank you!

## 2015-11-21 NOTE — Telephone Encounter (Signed)
Can we get a copy of her sleep study?

## 2015-11-23 DIAGNOSIS — N2581 Secondary hyperparathyroidism of renal origin: Secondary | ICD-10-CM | POA: Diagnosis not present

## 2015-11-23 DIAGNOSIS — D631 Anemia in chronic kidney disease: Secondary | ICD-10-CM | POA: Diagnosis not present

## 2015-11-23 DIAGNOSIS — D509 Iron deficiency anemia, unspecified: Secondary | ICD-10-CM | POA: Diagnosis not present

## 2015-11-23 DIAGNOSIS — N186 End stage renal disease: Secondary | ICD-10-CM | POA: Diagnosis not present

## 2015-11-23 DIAGNOSIS — Z992 Dependence on renal dialysis: Secondary | ICD-10-CM | POA: Diagnosis not present

## 2015-11-26 DIAGNOSIS — N2581 Secondary hyperparathyroidism of renal origin: Secondary | ICD-10-CM | POA: Diagnosis not present

## 2015-11-26 DIAGNOSIS — Z992 Dependence on renal dialysis: Secondary | ICD-10-CM | POA: Diagnosis not present

## 2015-11-26 DIAGNOSIS — D631 Anemia in chronic kidney disease: Secondary | ICD-10-CM | POA: Diagnosis not present

## 2015-11-26 DIAGNOSIS — D509 Iron deficiency anemia, unspecified: Secondary | ICD-10-CM | POA: Diagnosis not present

## 2015-11-26 DIAGNOSIS — N186 End stage renal disease: Secondary | ICD-10-CM | POA: Diagnosis not present

## 2015-11-28 DIAGNOSIS — N2581 Secondary hyperparathyroidism of renal origin: Secondary | ICD-10-CM | POA: Diagnosis not present

## 2015-11-28 DIAGNOSIS — Z992 Dependence on renal dialysis: Secondary | ICD-10-CM | POA: Diagnosis not present

## 2015-11-28 DIAGNOSIS — D509 Iron deficiency anemia, unspecified: Secondary | ICD-10-CM | POA: Diagnosis not present

## 2015-11-28 DIAGNOSIS — D631 Anemia in chronic kidney disease: Secondary | ICD-10-CM | POA: Diagnosis not present

## 2015-11-28 DIAGNOSIS — N186 End stage renal disease: Secondary | ICD-10-CM | POA: Diagnosis not present

## 2015-11-30 DIAGNOSIS — D509 Iron deficiency anemia, unspecified: Secondary | ICD-10-CM | POA: Diagnosis not present

## 2015-11-30 DIAGNOSIS — D631 Anemia in chronic kidney disease: Secondary | ICD-10-CM | POA: Diagnosis not present

## 2015-11-30 DIAGNOSIS — N186 End stage renal disease: Secondary | ICD-10-CM | POA: Diagnosis not present

## 2015-11-30 DIAGNOSIS — N2581 Secondary hyperparathyroidism of renal origin: Secondary | ICD-10-CM | POA: Diagnosis not present

## 2015-11-30 DIAGNOSIS — Z992 Dependence on renal dialysis: Secondary | ICD-10-CM | POA: Diagnosis not present

## 2015-12-03 DIAGNOSIS — N186 End stage renal disease: Secondary | ICD-10-CM | POA: Diagnosis not present

## 2015-12-03 DIAGNOSIS — D631 Anemia in chronic kidney disease: Secondary | ICD-10-CM | POA: Diagnosis not present

## 2015-12-03 DIAGNOSIS — Z992 Dependence on renal dialysis: Secondary | ICD-10-CM | POA: Diagnosis not present

## 2015-12-03 DIAGNOSIS — N2581 Secondary hyperparathyroidism of renal origin: Secondary | ICD-10-CM | POA: Diagnosis not present

## 2015-12-03 DIAGNOSIS — D509 Iron deficiency anemia, unspecified: Secondary | ICD-10-CM | POA: Diagnosis not present

## 2015-12-05 DIAGNOSIS — N2581 Secondary hyperparathyroidism of renal origin: Secondary | ICD-10-CM | POA: Diagnosis not present

## 2015-12-05 DIAGNOSIS — D509 Iron deficiency anemia, unspecified: Secondary | ICD-10-CM | POA: Diagnosis not present

## 2015-12-05 DIAGNOSIS — D631 Anemia in chronic kidney disease: Secondary | ICD-10-CM | POA: Diagnosis not present

## 2015-12-05 DIAGNOSIS — N186 End stage renal disease: Secondary | ICD-10-CM | POA: Diagnosis not present

## 2015-12-05 DIAGNOSIS — Z992 Dependence on renal dialysis: Secondary | ICD-10-CM | POA: Diagnosis not present

## 2015-12-07 DIAGNOSIS — N186 End stage renal disease: Secondary | ICD-10-CM | POA: Diagnosis not present

## 2015-12-07 DIAGNOSIS — D509 Iron deficiency anemia, unspecified: Secondary | ICD-10-CM | POA: Diagnosis not present

## 2015-12-07 DIAGNOSIS — Z992 Dependence on renal dialysis: Secondary | ICD-10-CM | POA: Diagnosis not present

## 2015-12-07 DIAGNOSIS — N2581 Secondary hyperparathyroidism of renal origin: Secondary | ICD-10-CM | POA: Diagnosis not present

## 2015-12-07 DIAGNOSIS — D631 Anemia in chronic kidney disease: Secondary | ICD-10-CM | POA: Diagnosis not present

## 2015-12-10 DIAGNOSIS — D509 Iron deficiency anemia, unspecified: Secondary | ICD-10-CM | POA: Diagnosis not present

## 2015-12-10 DIAGNOSIS — D631 Anemia in chronic kidney disease: Secondary | ICD-10-CM | POA: Diagnosis not present

## 2015-12-10 DIAGNOSIS — N2581 Secondary hyperparathyroidism of renal origin: Secondary | ICD-10-CM | POA: Diagnosis not present

## 2015-12-10 DIAGNOSIS — Z992 Dependence on renal dialysis: Secondary | ICD-10-CM | POA: Diagnosis not present

## 2015-12-10 DIAGNOSIS — N186 End stage renal disease: Secondary | ICD-10-CM | POA: Diagnosis not present

## 2015-12-12 DIAGNOSIS — D631 Anemia in chronic kidney disease: Secondary | ICD-10-CM | POA: Diagnosis not present

## 2015-12-12 DIAGNOSIS — Z992 Dependence on renal dialysis: Secondary | ICD-10-CM | POA: Diagnosis not present

## 2015-12-12 DIAGNOSIS — N186 End stage renal disease: Secondary | ICD-10-CM | POA: Diagnosis not present

## 2015-12-12 DIAGNOSIS — D509 Iron deficiency anemia, unspecified: Secondary | ICD-10-CM | POA: Diagnosis not present

## 2015-12-12 DIAGNOSIS — N2581 Secondary hyperparathyroidism of renal origin: Secondary | ICD-10-CM | POA: Diagnosis not present

## 2015-12-13 DIAGNOSIS — Z992 Dependence on renal dialysis: Secondary | ICD-10-CM | POA: Diagnosis not present

## 2015-12-13 DIAGNOSIS — E785 Hyperlipidemia, unspecified: Secondary | ICD-10-CM | POA: Diagnosis not present

## 2015-12-13 DIAGNOSIS — I10 Essential (primary) hypertension: Secondary | ICD-10-CM | POA: Diagnosis not present

## 2015-12-13 DIAGNOSIS — I739 Peripheral vascular disease, unspecified: Secondary | ICD-10-CM | POA: Diagnosis not present

## 2015-12-13 DIAGNOSIS — N186 End stage renal disease: Secondary | ICD-10-CM | POA: Diagnosis not present

## 2015-12-13 DIAGNOSIS — Y841 Kidney dialysis as the cause of abnormal reaction of the patient, or of later complication, without mention of misadventure at the time of the procedure: Secondary | ICD-10-CM | POA: Diagnosis not present

## 2015-12-13 DIAGNOSIS — E119 Type 2 diabetes mellitus without complications: Secondary | ICD-10-CM | POA: Diagnosis not present

## 2015-12-13 DIAGNOSIS — T82318A Breakdown (mechanical) of other vascular grafts, initial encounter: Secondary | ICD-10-CM | POA: Diagnosis not present

## 2015-12-14 DIAGNOSIS — D631 Anemia in chronic kidney disease: Secondary | ICD-10-CM | POA: Diagnosis not present

## 2015-12-14 DIAGNOSIS — N186 End stage renal disease: Secondary | ICD-10-CM | POA: Diagnosis not present

## 2015-12-14 DIAGNOSIS — N2581 Secondary hyperparathyroidism of renal origin: Secondary | ICD-10-CM | POA: Diagnosis not present

## 2015-12-14 DIAGNOSIS — D509 Iron deficiency anemia, unspecified: Secondary | ICD-10-CM | POA: Diagnosis not present

## 2015-12-14 DIAGNOSIS — Z992 Dependence on renal dialysis: Secondary | ICD-10-CM | POA: Diagnosis not present

## 2015-12-17 ENCOUNTER — Telehealth: Payer: Self-pay | Admitting: *Deleted

## 2015-12-17 DIAGNOSIS — Z992 Dependence on renal dialysis: Secondary | ICD-10-CM | POA: Diagnosis not present

## 2015-12-17 DIAGNOSIS — N2581 Secondary hyperparathyroidism of renal origin: Secondary | ICD-10-CM | POA: Diagnosis not present

## 2015-12-17 DIAGNOSIS — D631 Anemia in chronic kidney disease: Secondary | ICD-10-CM | POA: Diagnosis not present

## 2015-12-17 DIAGNOSIS — N186 End stage renal disease: Secondary | ICD-10-CM | POA: Diagnosis not present

## 2015-12-17 DIAGNOSIS — D509 Iron deficiency anemia, unspecified: Secondary | ICD-10-CM | POA: Diagnosis not present

## 2015-12-17 NOTE — Telephone Encounter (Signed)
Patent has requested to be seen by Dr. Gilford Rile before aug 4, for a follow up. Pt contact [phone]

## 2015-12-17 NOTE — Telephone Encounter (Signed)
Called and scheduled patient, thanks

## 2015-12-19 DIAGNOSIS — D631 Anemia in chronic kidney disease: Secondary | ICD-10-CM | POA: Diagnosis not present

## 2015-12-19 DIAGNOSIS — N2581 Secondary hyperparathyroidism of renal origin: Secondary | ICD-10-CM | POA: Diagnosis not present

## 2015-12-19 DIAGNOSIS — D509 Iron deficiency anemia, unspecified: Secondary | ICD-10-CM | POA: Diagnosis not present

## 2015-12-19 DIAGNOSIS — Z992 Dependence on renal dialysis: Secondary | ICD-10-CM | POA: Diagnosis not present

## 2015-12-19 DIAGNOSIS — N186 End stage renal disease: Secondary | ICD-10-CM | POA: Diagnosis not present

## 2015-12-20 ENCOUNTER — Ambulatory Visit (INDEPENDENT_AMBULATORY_CARE_PROVIDER_SITE_OTHER): Payer: Medicare Other | Admitting: Internal Medicine

## 2015-12-20 VITALS — BP 136/60 | HR 76 | Ht 67.0 in | Wt 172.6 lb

## 2015-12-20 DIAGNOSIS — Z992 Dependence on renal dialysis: Secondary | ICD-10-CM

## 2015-12-20 DIAGNOSIS — E1122 Type 2 diabetes mellitus with diabetic chronic kidney disease: Secondary | ICD-10-CM

## 2015-12-20 DIAGNOSIS — I25111 Atherosclerotic heart disease of native coronary artery with angina pectoris with documented spasm: Secondary | ICD-10-CM

## 2015-12-20 DIAGNOSIS — N186 End stage renal disease: Secondary | ICD-10-CM

## 2015-12-20 DIAGNOSIS — G4733 Obstructive sleep apnea (adult) (pediatric): Secondary | ICD-10-CM | POA: Diagnosis not present

## 2015-12-20 DIAGNOSIS — Z9989 Dependence on other enabling machines and devices: Secondary | ICD-10-CM

## 2015-12-20 DIAGNOSIS — Z794 Long term (current) use of insulin: Secondary | ICD-10-CM

## 2015-12-20 DIAGNOSIS — I10 Essential (primary) hypertension: Secondary | ICD-10-CM | POA: Diagnosis not present

## 2015-12-20 NOTE — Assessment & Plan Note (Signed)
Continue HD MWF.

## 2015-12-20 NOTE — Progress Notes (Signed)
Subjective:    Patient ID: Theresa [name], female    DOB: [birth year], 58 y.o.   MRN: [id number]  HPI  60YO female presents for follow up.  OSA - Had sleep study which suggested need for CPAP. Wearing nasal mask at home with 10cmH2O. Tolerating well. Wearing every night. Sleeping about 8 hr per night. Started at end of June.  ESRD - Continues on HD on MWF. Tolerating well.  DM - Recent A1c was 5.8% Continues on Levemir 5units. No low BG noted.    Wt Readings from Last 3 Encounters:  12/20/15 172 lb 9.6 oz (78.291 kg)  10/26/15 174 lb 12.8 oz (79.289 kg)  09/26/15 175 lb (79.379 kg)   BP Readings from Last 3 Encounters:  12/20/15 136/60  10/26/15 132/68  09/26/15 178/60    Past Medical History  Diagnosis Date  . Hypertension   . ESRD on hemodialysis ([place])     a. [facility] in [place], [place], on a TTS schedule.  She started dialysis in Feb 2014.  Etiology of renal failure not known, likely diabetes.  Has a left upper arm AV graft.  Marland Kitchen COPD (chronic obstructive pulmonary disease) ([place])   . Emphysema   . History of pneumonia     June 2012  . History of bronchitis     Mar 2012  . Peripheral vascular disease ([place])   . Chronic constipation   . Colon polyps   . Sickle cell trait ([place])   . Anemia of chronic disease   . Diabetes mellitus   . Moderate mitral insufficiency     a. 10/2013 Echo: EF 55-60%, mod MR.  . Diabetic neuropathy ([place])   . Diabetic retinopathy ([place]) 05/28/2013    Hx bilat retinal detachment, proliferative diab retinopathy and bilat vitreous hemorrhage   . Hyperlipidemia   . Coronary artery disease     a. 05/2013 NSTEMI/PCI: LM 20d, LAD min irregs, LCX small, nl, OM1 nl, RCA dom 29m (2.5x16 Promus DES), PDA1 80p.  . Chronic diastolic CHF (congestive heart failure) ([place])     a. 10/2013 Echo [facility]): EF 55-60%, mod conc LVH, mod MR, mildly dil LA, mild Ao sclerosis w/o stenosis.  . Carotid arterial disease ([place])     a. 02/2013 U/S: 40-59% bilat ICA stenosis - *f/u  02/2014*  . History of tobacco abuse     a. Quit 2012.   Family History  Problem Relation Age of Onset  . Anesthesia problems Neg Hx   . Hypotension Neg Hx   . Malignant hyperthermia Neg Hx   . Pseudochol deficiency Neg Hx   . Stroke Mother   . Heart attack Mother   . Heart disease Mother   . Colon cancer Father   . Colon cancer Sister   . Heart attack Brother   . Stroke Brother   . Diabetes Brother   . Breast cancer Sister   . Diabetes Brother   . Diabetes Daughter    Past Surgical History  Procedure Laterality Date  . Abdominal hysterectomy      2000  . Dilation and curettage of uterus      several in the early 80's  . Eye surgery      bilateral laser 2012  . Esophagogastroduodenoscopy      2012  . Colonscopy    . Tubal ligation      1979  . Pars plana vitrectomy  04/22/2011    Procedure: PARS PLANA VITRECTOMY WITH 25 GAUGE;  Surgeon: [name],  MD;  Location: [place];  Service: Ophthalmology;  Laterality: Left;  membrane peel, endolaser, gas fluid exchange, silicone oil, repair of complex traction retinal detachment  . Pars plana vitrectomy  09/30/2011    Procedure: PARS PLANA VITRECTOMY WITH 25 GAUGE;  Surgeon: [name], MD;  Location: [place];  Service: Ophthalmology;  Laterality: Right;  Endolaser; Repair of Complex Traction Retinal Detachment  . Gas/fluid exchange  09/30/2011    Procedure: GAS/FLUID EXCHANGE;  Surgeon: [name], MD;  Location: [place];  Service: Ophthalmology;  Laterality: Right;  . Gas insertion  09/30/2011    Procedure: INSERTION OF GAS;  Surgeon: [name], MD;  Location: [place];  Service: Ophthalmology;  Laterality: Right;  C3F8  . Eye surgery      right  . Pars plana vitrectomy  02/24/2012    Procedure: PARS PLANA VITRECTOMY WITH 25 GAUGE;  Surgeon: [name], MD;  Location: [place];  Service: Ophthalmology;  Laterality: Left;  . Silicon oil removal  [id number]    Procedure: SILICON OIL REMOVAL;  Surgeon: [name],  MD;  Location: [place];  Service: Ophthalmology;  Laterality: Left;  . Ptca    . Thrombectomy / arteriovenous graft revision    . Cardiac catheterization    . Coronary angioplasty  05/28/2014    stent placement to the mid RCA  . Left heart catheterization with coronary angiogram N/A 05/28/2013    Procedure: LEFT HEART CATHETERIZATION WITH CORONARY ANGIOGRAM;  Surgeon: [name], MD;  Location: [facility] CATH LAB;  Service: Cardiovascular;  Laterality: N/A;  . Cardiac catheterization N/A 05/02/2015    Procedure: Left Heart Cath and Coronary Angiography;  Surgeon: [name], MD;  Location: [place] CV LAB;  Service: Cardiovascular;  Laterality: N/A;   Social History   Social History  . Marital Status: Divorced    Spouse Name: N/A  . Number of Children: 2  . Years of Education: N/A   Occupational History  . [place] History Main Topics  . Smoking status: Former Smoker -- 1.00 packs/day for 25 years    Quit date: 06/09/2010  . Smokeless tobacco: Never Used  . Alcohol Use: No  . Drug Use: No  . Sexual Activity: No   Other Topics Concern  . Not on file   Social History Narrative    Review of Systems  Constitutional: Negative for fever, chills, appetite change, fatigue and unexpected weight change.  Eyes: Negative for visual disturbance.  Respiratory: Negative for shortness of breath.   Cardiovascular: Negative for chest pain and leg swelling.  Gastrointestinal: Negative for nausea, vomiting, abdominal pain, diarrhea and constipation.  Musculoskeletal: Negative for myalgias and arthralgias.  Skin: Negative for color change and rash.  Hematological: Negative for adenopathy. Does not bruise/bleed easily.  Psychiatric/Behavioral: Negative for sleep disturbance and dysphoric mood. The patient is not nervous/anxious.        Objective:    BP 136/60 mmHg  Pulse 76  Ht 5\' 7"  (1.702 m)  Wt 172 lb 9.6 oz (78.291 kg)  BMI 27.03 kg/m2  SpO2  96% Physical Exam  Constitutional: She is oriented to person, place, and time. She appears well-developed and well-nourished. No distress.  HENT:  Head: Normocephalic and atraumatic.  Right Ear: External ear normal.  Left Ear: External ear normal.  Nose: Nose normal.  Mouth/Throat: Oropharynx is clear and moist. No oropharyngeal exudate.  Eyes: Conjunctivae are normal. Pupils are equal, round, and reactive  to light. Right eye exhibits no discharge. Left eye exhibits no discharge. No scleral icterus.  Neck: Normal range of motion. Neck supple. No tracheal deviation present. No thyromegaly present.  Cardiovascular: Normal rate, regular rhythm, normal heart sounds and intact distal pulses.  Exam reveals no gallop and no friction rub.   No murmur heard. Pulmonary/Chest: Effort normal and breath sounds normal. No respiratory distress. She has no wheezes. She has no rales. She exhibits no tenderness.  Musculoskeletal: Normal range of motion. She exhibits no edema or tenderness.  Lymphadenopathy:    She has no cervical adenopathy.  Neurological: She is alert and oriented to person, place, and time. No cranial nerve deficit. She exhibits normal muscle tone. Coordination normal.  Skin: Skin is warm and dry. No rash noted. She is not diaphoretic. No erythema. No pallor.  Psychiatric: She has a normal mood and affect. Her behavior is normal. Judgment and thought content normal.          Assessment & Plan:   Problem List Items Addressed This Visit      Unprioritized   Diabetes mellitus, type II ([place]) (Chronic)    BG on low side. Will hold Levemir, as only taking 5units. Follow up in 2 weeks.      ESRD on hemodialysis (HCC) (Chronic)    Continue HD MWF.      Essential (primary) hypertension (Chronic)    BP Readings from Last 3 Encounters:  12/20/15 136/60  10/26/15 132/68  09/26/15 178/60   BP well controlled. Continue current medications.      OSA on CPAP - Primary (Chronic)     Tolerating CPAP well. Will continue 10cmH2O with nasal mask.          Return in about 2 weeks (around 01/03/2016) for New Patient.  Ronette Deter, MD Internal Medicine [facility]

## 2015-12-20 NOTE — Assessment & Plan Note (Signed)
BG on low side. Will hold Levemir, as only taking 5units. Follow up in 2 weeks.

## 2015-12-20 NOTE — Patient Instructions (Addendum)
Hold Levemir insulin and monitor blood sugars.

## 2015-12-20 NOTE — Assessment & Plan Note (Signed)
Tolerating CPAP well. Will continue 10cmH2O with nasal mask.

## 2015-12-20 NOTE — Progress Notes (Signed)
Pre visit review using our clinic review tool, if applicable. No additional management support is needed unless otherwise documented below in the visit note. 

## 2015-12-20 NOTE — Assessment & Plan Note (Signed)
BP Readings from Last 3 Encounters:  12/20/15 136/60  10/26/15 132/68  09/26/15 178/60   BP well controlled. Continue current medications.

## 2015-12-21 DIAGNOSIS — N2581 Secondary hyperparathyroidism of renal origin: Secondary | ICD-10-CM | POA: Diagnosis not present

## 2015-12-21 DIAGNOSIS — Z992 Dependence on renal dialysis: Secondary | ICD-10-CM | POA: Diagnosis not present

## 2015-12-21 DIAGNOSIS — D509 Iron deficiency anemia, unspecified: Secondary | ICD-10-CM | POA: Diagnosis not present

## 2015-12-21 DIAGNOSIS — N186 End stage renal disease: Secondary | ICD-10-CM | POA: Diagnosis not present

## 2015-12-21 DIAGNOSIS — D631 Anemia in chronic kidney disease: Secondary | ICD-10-CM | POA: Diagnosis not present

## 2015-12-24 DIAGNOSIS — N186 End stage renal disease: Secondary | ICD-10-CM | POA: Diagnosis not present

## 2015-12-24 DIAGNOSIS — Z992 Dependence on renal dialysis: Secondary | ICD-10-CM | POA: Diagnosis not present

## 2015-12-24 DIAGNOSIS — D509 Iron deficiency anemia, unspecified: Secondary | ICD-10-CM | POA: Diagnosis not present

## 2015-12-24 DIAGNOSIS — N2581 Secondary hyperparathyroidism of renal origin: Secondary | ICD-10-CM | POA: Diagnosis not present

## 2015-12-24 DIAGNOSIS — D631 Anemia in chronic kidney disease: Secondary | ICD-10-CM | POA: Diagnosis not present

## 2015-12-26 ENCOUNTER — Ambulatory Visit: Payer: Medicare Other | Attending: Family | Admitting: Family

## 2015-12-26 ENCOUNTER — Encounter: Payer: Self-pay | Admitting: Family

## 2015-12-26 VITALS — BP 155/57 | HR 70 | Resp 18 | Ht 67.0 in | Wt 178.0 lb

## 2015-12-26 DIAGNOSIS — Z90710 Acquired absence of both cervix and uterus: Secondary | ICD-10-CM | POA: Insufficient documentation

## 2015-12-26 DIAGNOSIS — I739 Peripheral vascular disease, unspecified: Secondary | ICD-10-CM | POA: Diagnosis not present

## 2015-12-26 DIAGNOSIS — J439 Emphysema, unspecified: Secondary | ICD-10-CM | POA: Diagnosis not present

## 2015-12-26 DIAGNOSIS — D631 Anemia in chronic kidney disease: Secondary | ICD-10-CM | POA: Insufficient documentation

## 2015-12-26 DIAGNOSIS — N2581 Secondary hyperparathyroidism of renal origin: Secondary | ICD-10-CM | POA: Diagnosis not present

## 2015-12-26 DIAGNOSIS — Z86010 Personal history of colonic polyps: Secondary | ICD-10-CM | POA: Insufficient documentation

## 2015-12-26 DIAGNOSIS — K5909 Other constipation: Secondary | ICD-10-CM | POA: Diagnosis not present

## 2015-12-26 DIAGNOSIS — Z803 Family history of malignant neoplasm of breast: Secondary | ICD-10-CM | POA: Insufficient documentation

## 2015-12-26 DIAGNOSIS — M549 Dorsalgia, unspecified: Secondary | ICD-10-CM | POA: Insufficient documentation

## 2015-12-26 DIAGNOSIS — J449 Chronic obstructive pulmonary disease, unspecified: Secondary | ICD-10-CM | POA: Insufficient documentation

## 2015-12-26 DIAGNOSIS — Z9989 Dependence on other enabling machines and devices: Secondary | ICD-10-CM

## 2015-12-26 DIAGNOSIS — R5383 Other fatigue: Secondary | ICD-10-CM | POA: Insufficient documentation

## 2015-12-26 DIAGNOSIS — Z87891 Personal history of nicotine dependence: Secondary | ICD-10-CM | POA: Insufficient documentation

## 2015-12-26 DIAGNOSIS — K5900 Constipation, unspecified: Secondary | ICD-10-CM

## 2015-12-26 DIAGNOSIS — N186 End stage renal disease: Secondary | ICD-10-CM | POA: Diagnosis not present

## 2015-12-26 DIAGNOSIS — D573 Sickle-cell trait: Secondary | ICD-10-CM | POA: Diagnosis not present

## 2015-12-26 DIAGNOSIS — Z955 Presence of coronary angioplasty implant and graft: Secondary | ICD-10-CM | POA: Insufficient documentation

## 2015-12-26 DIAGNOSIS — E1140 Type 2 diabetes mellitus with diabetic neuropathy, unspecified: Secondary | ICD-10-CM | POA: Diagnosis not present

## 2015-12-26 DIAGNOSIS — Z8701 Personal history of pneumonia (recurrent): Secondary | ICD-10-CM | POA: Diagnosis not present

## 2015-12-26 DIAGNOSIS — I5032 Chronic diastolic (congestive) heart failure: Secondary | ICD-10-CM | POA: Diagnosis not present

## 2015-12-26 DIAGNOSIS — D509 Iron deficiency anemia, unspecified: Secondary | ICD-10-CM | POA: Diagnosis not present

## 2015-12-26 DIAGNOSIS — I10 Essential (primary) hypertension: Secondary | ICD-10-CM

## 2015-12-26 DIAGNOSIS — I6523 Occlusion and stenosis of bilateral carotid arteries: Secondary | ICD-10-CM | POA: Insufficient documentation

## 2015-12-26 DIAGNOSIS — I252 Old myocardial infarction: Secondary | ICD-10-CM | POA: Diagnosis not present

## 2015-12-26 DIAGNOSIS — Z800 Family history of malignant neoplasm of digestive organs: Secondary | ICD-10-CM | POA: Insufficient documentation

## 2015-12-26 DIAGNOSIS — Z992 Dependence on renal dialysis: Secondary | ICD-10-CM | POA: Diagnosis not present

## 2015-12-26 DIAGNOSIS — E785 Hyperlipidemia, unspecified: Secondary | ICD-10-CM | POA: Insufficient documentation

## 2015-12-26 DIAGNOSIS — G4733 Obstructive sleep apnea (adult) (pediatric): Secondary | ICD-10-CM | POA: Insufficient documentation

## 2015-12-26 DIAGNOSIS — Z888 Allergy status to other drugs, medicaments and biological substances status: Secondary | ICD-10-CM | POA: Insufficient documentation

## 2015-12-26 DIAGNOSIS — E1122 Type 2 diabetes mellitus with diabetic chronic kidney disease: Secondary | ICD-10-CM | POA: Diagnosis not present

## 2015-12-26 DIAGNOSIS — Z885 Allergy status to narcotic agent status: Secondary | ICD-10-CM | POA: Insufficient documentation

## 2015-12-26 DIAGNOSIS — Z823 Family history of stroke: Secondary | ICD-10-CM | POA: Insufficient documentation

## 2015-12-26 DIAGNOSIS — I2510 Atherosclerotic heart disease of native coronary artery without angina pectoris: Secondary | ICD-10-CM | POA: Insufficient documentation

## 2015-12-26 DIAGNOSIS — Z8249 Family history of ischemic heart disease and other diseases of the circulatory system: Secondary | ICD-10-CM | POA: Insufficient documentation

## 2015-12-26 DIAGNOSIS — E11319 Type 2 diabetes mellitus with unspecified diabetic retinopathy without macular edema: Secondary | ICD-10-CM | POA: Diagnosis not present

## 2015-12-26 DIAGNOSIS — I132 Hypertensive heart and chronic kidney disease with heart failure and with stage 5 chronic kidney disease, or end stage renal disease: Secondary | ICD-10-CM | POA: Diagnosis not present

## 2015-12-26 DIAGNOSIS — Z833 Family history of diabetes mellitus: Secondary | ICD-10-CM | POA: Insufficient documentation

## 2015-12-26 DIAGNOSIS — Z7982 Long term (current) use of aspirin: Secondary | ICD-10-CM | POA: Insufficient documentation

## 2015-12-26 NOTE — Progress Notes (Signed)
Subjective:    Patient ID: Theresa [name], female    DOB: 1956-05-05, 60 y.o.   MRN: [id number]  Congestive Heart Failure Presents for follow-up visit. The disease course has been stable. Associated symptoms include fatigue. Pertinent negatives include no abdominal pain, chest pain, chest pressure, edema, orthopnea, palpitations or shortness of breath. The symptoms have been stable. Past treatments include salt and fluid restriction and beta blockers. The treatment provided moderate relief. Compliance with prior treatments has been good. Her past medical history is significant for anemia, CAD, chronic lung disease, DM and HTN. There is no history of CVA. She has multiple 1st degree relatives with heart disease.  Hypertension This is a chronic problem. The current episode started more than 1 year ago. The problem is unchanged. Pertinent negatives include no anxiety, chest pain, headaches, neck pain, palpitations, peripheral edema or shortness of breath. There are no associated agents to hypertension. Risk factors for coronary artery disease include diabetes mellitus, dyslipidemia and family history. Past treatments include beta blockers, diuretics and lifestyle changes. The current treatment provides moderate improvement. There are no compliance problems.  Hypertensive end-organ damage includes kidney disease, CAD/MI and heart failure.  Constipation This is a chronic problem. The current episode started more than 1 year ago. The problem has been waxing and waning since onset. The stool is described as firm. The patient is on a high fiber diet. She does not exercise regularly. There has been adequate water intake. Associated symptoms include back pain (chronic). Pertinent negatives include no abdominal pain, fever, melena or nausea. She has tried diet changes and laxatives for the symptoms. The treatment provided mild relief.   Past Medical History  Diagnosis Date  . Hypertension   . ESRD on  hemodialysis ([place])     a. [facility] in [place], [place], on a TTS schedule.  She started dialysis in Feb 2014.  Etiology of renal failure not known, likely diabetes.  Has a left upper arm AV graft.  Marland Kitchen COPD (chronic obstructive pulmonary disease) ([place])   . Emphysema   . History of pneumonia     June 2012  . History of bronchitis     Mar 2012  . Peripheral vascular disease ([place])   . Chronic constipation   . Colon polyps   . Sickle cell trait ([place])   . Anemia of chronic disease   . Diabetes mellitus   . Moderate mitral insufficiency     a. 10/2013 Echo: EF 55-60%, mod MR.  . Diabetic neuropathy ([place])   . Diabetic retinopathy ([place]) 05/28/2013    Hx bilat retinal detachment, proliferative diab retinopathy and bilat vitreous hemorrhage   . Hyperlipidemia   . Coronary artery disease     a. 05/2013 NSTEMI/PCI: LM 20d, LAD min irregs, LCX small, nl, OM1 nl, RCA dom 26m (2.5x16 Promus DES), PDA1 80p.  . Chronic diastolic CHF (congestive heart failure) ([place])     a. 10/2013 Echo [facility]): EF 55-60%, mod conc LVH, mod MR, mildly dil LA, mild Ao sclerosis w/o stenosis.  . Carotid arterial disease ([place])     a. 02/2013 U/S: 40-59% bilat ICA stenosis - *f/u 02/2014*  . History of tobacco abuse     a. Quit 2012.    Past Surgical History  Procedure Laterality Date  . Abdominal hysterectomy      2000  . Dilation and curettage of uterus      several in the early 80's  . Eye surgery      bilateral laser  2012  . Esophagogastroduodenoscopy      2012  . Colonscopy    . Tubal ligation      1979  . Pars plana vitrectomy  04/22/2011    Procedure: PARS PLANA VITRECTOMY WITH 25 GAUGE;  Surgeon: [name], MD;  Location: [place];  Service: Ophthalmology;  Laterality: Left;  membrane peel, endolaser, gas fluid exchange, silicone oil, repair of complex traction retinal detachment  . Pars plana vitrectomy  09/30/2011    Procedure: PARS PLANA VITRECTOMY WITH 25 GAUGE;  Surgeon: [name], MD;  Location: [place];  Service: Ophthalmology;  Laterality: Right;  Endolaser; Repair of Complex Traction Retinal Detachment  . Gas/fluid exchange  09/30/2011    Procedure: GAS/FLUID EXCHANGE;  Surgeon: [name], MD;  Location: [place];  Service: Ophthalmology;  Laterality: Right;  . Gas insertion  09/30/2011    Procedure: INSERTION OF GAS;  Surgeon: [name], MD;  Location: [place];  Service: Ophthalmology;  Laterality: Right;  C3F8  . Eye surgery      right  . Pars plana vitrectomy  02/24/2012    Procedure: PARS PLANA VITRECTOMY WITH 25 GAUGE;  Surgeon: [name], MD;  Location: [place];  Service: Ophthalmology;  Laterality: Left;  . Silicon oil removal  [id number]    Procedure: SILICON OIL REMOVAL;  Surgeon: [name], MD;  Location: [place];  Service: Ophthalmology;  Laterality: Left;  . Ptca    . Thrombectomy / arteriovenous graft revision    . Cardiac catheterization    . Coronary angioplasty  05/28/2014    stent placement to the mid RCA  . Left heart catheterization with coronary angiogram N/A 05/28/2013    Procedure: LEFT HEART CATHETERIZATION WITH CORONARY ANGIOGRAM;  Surgeon: [name], MD;  Location: [facility] CATH LAB;  Service: Cardiovascular;  Laterality: N/A;  . Cardiac catheterization N/A 05/02/2015    Procedure: Left Heart Cath and Coronary Angiography;  Surgeon: [name], MD;  Location: [place] CV LAB;  Service: Cardiovascular;  Laterality: N/A;    Family History  Problem Relation Age of Onset  . Anesthesia problems Neg Hx   . Hypotension Neg Hx   . Malignant hyperthermia Neg Hx   . Pseudochol deficiency Neg Hx   . Stroke Mother   . Heart attack Mother   . Heart disease Mother   . Colon cancer Father   . Colon cancer Sister   . Heart attack Brother   . Stroke Brother   . Diabetes Brother   . Breast cancer Sister   . Diabetes Brother   . Diabetes Daughter     Social History  Substance Use Topics  . Smoking status: Former Smoker -- 1.00  packs/day for 25 years    Quit date: 06/09/2010  . Smokeless tobacco: Never Used  . Alcohol Use: No    Allergies  Allergen Reactions  . Metformin Diarrhea  . Hydrocodone Hives    Prior to Admission medications   Medication Sig Start Date End Date Taking? Authorizing Provider  albuterol (PROVENTIL HFA;VENTOLIN HFA) 108 (90 BASE) MCG/ACT inhaler Inhale 2 puffs into the lungs every 6 (six) hours as needed for wheezing or shortness of breath.   Yes Historical Provider, MD  albuterol (PROVENTIL) (2.5 MG/3ML) 0.083% nebulizer solution Take 2.5 mg by nebulization every 4 (four) hours as needed for wheezing or shortness of breath.   Yes Historical Provider, MD  aspirin EC 81 MG tablet Take 81 mg by mouth  daily.   Yes Historical Provider, MD  calcium acetate (PHOSLO) 667 MG capsule Take 2,668 mg by mouth 3 (three) times daily with meals.    Yes Historical Provider, MD  carvedilol (COREG) 12.5 MG tablet Take 1 tablet (12.5 mg total) by mouth 2 (two) times daily. 06/28/15  Yes Alisa Graff, FNP  cloNIDine (CATAPRES) 0.1 MG tablet Take 1 tablet (0.1 mg total) by mouth 2 (two) times daily. 01/30/15  Yes Minna Merritts, MD  CRESTOR 40 MG tablet TAKE ONE TABLET BY MOUTH ONCE DAILY 08/09/15  Yes Minna Merritts, MD  furosemide (LASIX) 80 MG tablet Take 80 mg by mouth daily.    Yes Historical Provider, MD  Melatonin 1 MG CAPS Take 1 capsule by mouth daily.   Yes Historical Provider, MD  multivitamin (RENA-VIT) TABS tablet Take 1 tablet by mouth daily.   Yes Historical Provider, MD  nitroGLYCERIN (NITROSTAT) 0.4 MG SL tablet Place 1 tablet (0.4 mg total) under the tongue every 5 (five) minutes as needed for chest pain. 09/26/15  Yes Alisa Graff, FNP  pregabalin (LYRICA) 75 MG capsule Take 1 capsule (75 mg total) by mouth daily. 03/28/14  Yes Jackolyn Confer, MD      Review of Systems  Constitutional: Positive for fatigue. Negative for fever and appetite change.  HENT: Negative for congestion,  postnasal drip and sore throat.   Eyes: Negative.   Respiratory: Negative for cough, chest tightness and shortness of breath.   Cardiovascular: Negative for chest pain, palpitations and leg swelling.  Gastrointestinal: Positive for constipation. Negative for nausea, abdominal pain, melena and abdominal distention.  Endocrine: Negative.   Genitourinary: Negative.   Musculoskeletal: Positive for back pain (chronic). Negative for neck pain.  Skin: Negative.   Allergic/Immunologic: Negative.   Neurological: Negative for dizziness, light-headedness and headaches.  Hematological: Negative for adenopathy. Does not bruise/bleed easily.  Psychiatric/Behavioral: Negative for sleep disturbance (sleeping with CPAP) and dysphoric mood. The patient is not nervous/anxious.        Objective:   Physical Exam  Constitutional: She is oriented to person, place, and time. She appears well-developed and well-nourished.  HENT:  Head: Normocephalic and atraumatic.  Eyes: Conjunctivae are normal. Pupils are equal, round, and reactive to light.  Neck: Normal range of motion. Neck supple.  Cardiovascular: Normal rate and regular rhythm.   Pulmonary/Chest: Effort normal. She has no wheezes. She has no rales.  Abdominal: Soft. She exhibits no distension. There is no tenderness.  Musculoskeletal: She exhibits no edema or tenderness.  Neurological: She is alert and oriented to person, place, and time.  Skin: Skin is warm and dry.  Psychiatric: She has a normal mood and affect. Her behavior is normal. Thought content normal.  Nursing note and vitals reviewed.   BP 155/57 mmHg  Pulse 70  Resp 18  Ht 5\' 7"  (1.702 m)  Wt 178 lb (80.74 kg)  BMI 27.87 kg/m2  SpO2 97%       Assessment & Plan:  1: Chronic heart failure with preserved ejection fraction- Patient presents with fatigue with moderation exertion (Class II) which quickly improves upon rest. Denies any shortness of breath, edema or chest pain. She  continues to weigh herself at home and says that she's picked up a few pounds. By our scale, she's gained 3 pounds since she was last here on 09/26/15. Reminded to call for an overnight weight gain of >2 pounds or a weekly weight gain of >5 pounds. She says that she's  not been as active because of the hot/humid weather. She is not adding any additional salt to her food but does say that she's been eating air popped cheddar cheese popcorn which she is aware has salt in it and she does account for that in her daily intake.  2: HTN- Blood pressure looks ok today. Continue medications at this time. [facility] went in and reviewed her medications with her.  3: Obstructive sleep apnea- Patient has been wearing her CPAP for about 3 weeks she thinks. Says that she's sleeping well at night. 4: Constipation- Patient says that this is chronic in nature and that she's taken "something in the past which produced diarrhea". Talked about using a stool softener such as colace or mirlax which would just soften the stool. Also encouraged her to speak with her PCP regarding this. 5: ESRD- Patient goes to dialysis on M, W, F.   Medication bottles were reviewed with the patient.  Return here in 3 months or sooner for any questions/problems before then.

## 2015-12-26 NOTE — Patient Instructions (Signed)
Continue weighing daily and call for an overnight weight gain of > 2 pounds or a weekly weight gain of >5 pounds. 

## 2015-12-28 DIAGNOSIS — Z992 Dependence on renal dialysis: Secondary | ICD-10-CM | POA: Diagnosis not present

## 2015-12-28 DIAGNOSIS — D509 Iron deficiency anemia, unspecified: Secondary | ICD-10-CM | POA: Diagnosis not present

## 2015-12-28 DIAGNOSIS — N186 End stage renal disease: Secondary | ICD-10-CM | POA: Diagnosis not present

## 2015-12-28 DIAGNOSIS — N2581 Secondary hyperparathyroidism of renal origin: Secondary | ICD-10-CM | POA: Diagnosis not present

## 2015-12-28 DIAGNOSIS — D631 Anemia in chronic kidney disease: Secondary | ICD-10-CM | POA: Diagnosis not present

## 2015-12-30 ENCOUNTER — Emergency Department
Admission: EM | Admit: 2015-12-30 | Discharge: 2015-12-30 | Disposition: A | Discharge status: Home / Self Care | Payer: Medicare Other | Attending: Emergency Medicine | Admitting: Emergency Medicine

## 2015-12-30 ENCOUNTER — Emergency Department: Payer: Medicare Other

## 2015-12-30 ENCOUNTER — Encounter: Payer: Self-pay | Admitting: Emergency Medicine

## 2015-12-30 DIAGNOSIS — Z79899 Other long term (current) drug therapy: Secondary | ICD-10-CM | POA: Diagnosis not present

## 2015-12-30 DIAGNOSIS — Z992 Dependence on renal dialysis: Secondary | ICD-10-CM | POA: Diagnosis not present

## 2015-12-30 DIAGNOSIS — J441 Chronic obstructive pulmonary disease with (acute) exacerbation: Secondary | ICD-10-CM | POA: Insufficient documentation

## 2015-12-30 DIAGNOSIS — Z955 Presence of coronary angioplasty implant and graft: Secondary | ICD-10-CM | POA: Insufficient documentation

## 2015-12-30 DIAGNOSIS — I5032 Chronic diastolic (congestive) heart failure: Secondary | ICD-10-CM | POA: Diagnosis not present

## 2015-12-30 DIAGNOSIS — E1122 Type 2 diabetes mellitus with diabetic chronic kidney disease: Secondary | ICD-10-CM | POA: Diagnosis not present

## 2015-12-30 DIAGNOSIS — E785 Hyperlipidemia, unspecified: Secondary | ICD-10-CM | POA: Insufficient documentation

## 2015-12-30 DIAGNOSIS — Z86010 Personal history of colonic polyps: Secondary | ICD-10-CM | POA: Insufficient documentation

## 2015-12-30 DIAGNOSIS — I132 Hypertensive heart and chronic kidney disease with heart failure and with stage 5 chronic kidney disease, or end stage renal disease: Secondary | ICD-10-CM | POA: Diagnosis not present

## 2015-12-30 DIAGNOSIS — Z7982 Long term (current) use of aspirin: Secondary | ICD-10-CM | POA: Diagnosis not present

## 2015-12-30 DIAGNOSIS — M5137 Other intervertebral disc degeneration, lumbosacral region: Secondary | ICD-10-CM | POA: Diagnosis not present

## 2015-12-30 DIAGNOSIS — R05 Cough: Secondary | ICD-10-CM | POA: Diagnosis not present

## 2015-12-30 DIAGNOSIS — E1151 Type 2 diabetes mellitus with diabetic peripheral angiopathy without gangrene: Secondary | ICD-10-CM | POA: Diagnosis not present

## 2015-12-30 DIAGNOSIS — I2510 Atherosclerotic heart disease of native coronary artery without angina pectoris: Secondary | ICD-10-CM | POA: Insufficient documentation

## 2015-12-30 DIAGNOSIS — R0789 Other chest pain: Secondary | ICD-10-CM | POA: Diagnosis not present

## 2015-12-30 DIAGNOSIS — N186 End stage renal disease: Secondary | ICD-10-CM | POA: Insufficient documentation

## 2015-12-30 DIAGNOSIS — E11319 Type 2 diabetes mellitus with unspecified diabetic retinopathy without macular edema: Secondary | ICD-10-CM | POA: Insufficient documentation

## 2015-12-30 DIAGNOSIS — R079 Chest pain, unspecified: Secondary | ICD-10-CM

## 2015-12-30 DIAGNOSIS — Z87891 Personal history of nicotine dependence: Secondary | ICD-10-CM | POA: Diagnosis not present

## 2015-12-30 LAB — BASIC METABOLIC PANEL
ANION GAP: 12 (ref 5–15)
BUN: 39 mg/dL — AB (ref 6–20)
CHLORIDE: 100 mmol/L — AB (ref 101–111)
CO2: 28 mmol/L (ref 22–32)
Calcium: 9 mg/dL (ref 8.9–10.3)
Creatinine, Ser: 8.22 mg/dL — ABNORMAL HIGH (ref 0.44–1.00)
GFR calc Af Amer: 6 mL/min — ABNORMAL LOW (ref >60)
GFR, EST NON AFRICAN AMERICAN: 5 mL/min — AB (ref >60)
GLUCOSE: 135 mg/dL — AB (ref 65–99)
POTASSIUM: 3.7 mmol/L (ref 3.5–5.1)
Sodium: 140 mmol/L (ref 135–145)

## 2015-12-30 LAB — TROPONIN I
TROPONIN I: 0.03 ng/mL — AB (ref <0.03)
TROPONIN I: 0.03 ng/mL — AB (ref <0.03)

## 2015-12-30 LAB — CBC
HCT: 23.3 % — ABNORMAL LOW (ref 35.0–47.0)
HEMOGLOBIN: 8.2 g/dL — AB (ref 12.0–16.0)
MCH: 35.6 pg — AB (ref 26.0–34.0)
MCHC: 35.2 g/dL (ref 32.0–36.0)
MCV: 101.2 fL — ABNORMAL HIGH (ref 80.0–100.0)
PLATELETS: 134 10*3/uL — AB (ref 150–440)
RBC: 2.3 MIL/uL — ABNORMAL LOW (ref 3.80–5.20)
RDW: 15.7 % — AB (ref 11.5–14.5)
WBC: 10.1 10*3/uL (ref 3.6–11.0)

## 2015-12-30 MED ORDER — GI COCKTAIL ~~LOC~~
30.0000 mL | Freq: Once | ORAL | Status: AC
  Administered 2015-12-30: 30 mL via ORAL
  Filled 2015-12-30: qty 30

## 2015-12-30 MED ORDER — PREDNISONE 10 MG PO TABS: ORAL_TABLET | ORAL | 0 refills | Status: DC

## 2015-12-30 MED ORDER — IPRATROPIUM-ALBUTEROL 0.5-2.5 (3) MG/3ML IN SOLN
3.0000 mL | Freq: Once | RESPIRATORY_TRACT | Status: AC
  Administered 2015-12-30: 3 mL via RESPIRATORY_TRACT
  Filled 2015-12-30: qty 3

## 2015-12-30 MED ORDER — ACETAMINOPHEN 325 MG PO TABS
650.0000 mg | ORAL_TABLET | Freq: Once | ORAL | Status: AC
  Administered 2015-12-30: 650 mg via ORAL

## 2015-12-30 MED ORDER — PREDNISONE 20 MG PO TABS
60.0000 mg | ORAL_TABLET | Freq: Once | ORAL | Status: AC
  Administered 2015-12-30: 60 mg via ORAL
  Filled 2015-12-30: qty 3

## 2015-12-30 MED ORDER — ACETAMINOPHEN 325 MG PO TABS
ORAL_TABLET | ORAL | Status: AC
  Administered 2015-12-30: 650 mg via ORAL
  Filled 2015-12-30: qty 2

## 2015-12-30 NOTE — ED Notes (Signed)
Report given to Grace,RN.

## 2015-12-30 NOTE — ED Triage Notes (Signed)
Per [facility], patient comes from home with c/o chest pain that began at 0630 this morning. Hx of stemi, HTN, COPD, CHF. Chest is tender on palpation. Denies injury to chest. Patient also c/o SOB and a cough. EMS reports temp of 99. Here patients temp is 98.5.

## 2015-12-30 NOTE — ED Provider Notes (Signed)
[facility] Emergency Department Provider Note   ____________________________________________  Time seen:  I have reviewed the triage vital signs and the triage nursing note.  HISTORY  Chief Complaint Chest Pain   Historian Patient  HPI Theresa [name] is a 60 y.o. female with history of coronary artery disease, COPD, CHF, as well as dialysis, who presents todayafter waking up this morning short of breath and having some chest pressure feeling like she couldn't get a deep breath. No pleuritic chest pain. No lower extremity edema. No fevers. Nonproductive cough.  No abdominal pain or vomiting or diarrhea. Weakness or numbness or altered mental status. Short of breath is moderate and little better than it was earlier but still there. She does not report when she was recently on any prednisone.    Past Medical History:  Diagnosis Date  . Anemia of chronic disease   . Carotid arterial disease ([place])    a. 02/2013 U/S: 40-59% bilat ICA stenosis - *f/u 02/2014*  . Chronic constipation   . Chronic diastolic CHF (congestive heart failure) ([place])    a. 10/2013 Echo [facility]): EF 55-60%, mod conc LVH, mod MR, mildly dil LA, mild Ao sclerosis w/o stenosis.  . Colon polyps   . COPD (chronic obstructive pulmonary disease) ([place])   . Coronary artery disease    a. 05/2013 NSTEMI/PCI: LM 20d, LAD min irregs, LCX small, nl, OM1 nl, RCA dom 68m (2.5x16 Promus DES), PDA1 80p.  . Diabetes mellitus   . Diabetic neuropathy ([place])   . Diabetic retinopathy ([place]) 05/28/2013   Hx bilat retinal detachment, proliferative diab retinopathy and bilat vitreous hemorrhage   . Emphysema   . ESRD on hemodialysis ([place])    a. [facility] in [place], [place], on a TTS schedule.  She started dialysis in Feb 2014.  Etiology of renal failure not known, likely diabetes.  Has a left upper arm AV graft.  . History of bronchitis    Mar 2012  . History of pneumonia    June 2012  . History of tobacco abuse    a.  Quit 2012.  Marland Kitchen Hyperlipidemia   . Hypertension   . Moderate mitral insufficiency    a. 10/2013 Echo: EF 55-60%, mod MR.  . Peripheral vascular disease ([place])   . Sickle cell trait [facility])     Patient Active Problem List   Diagnosis Date Noted  . OSA on CPAP 12/20/2015  . Screening for breast cancer 07/27/2015  . Diastolic heart failure ([place]) 05/07/2015  . S/P coronary artery stent placement   . Neuritis or radiculitis due to rupture of lumbar intervertebral disc 11/28/2014  . Right hip pain 06/13/2014  . Routine general medical examination at a health care facility 09/02/2013  . Osteoarthritis of right hip 08/23/2013  . DDD (degenerative disc disease), lumbosacral 08/23/2013  . Coronary artery disease 06/27/2013  . Diabetic retinopathy ([place]) 05/28/2013  . Hyperlipidemia   . Essential (primary) hypertension 11/08/2012  . Diabetic neuropathy ([place]) 02/18/2012  . Anemia in chronic kidney disease (CKD) 02/18/2012  . ESRD on hemodialysis ([place]) 08/21/2011  . Diabetes mellitus, type II ([place]) 06/30/2011  . COPD (chronic obstructive pulmonary disease) ([place]) 06/13/2011    Past Surgical History:  Procedure Laterality Date  . ABDOMINAL HYSTERECTOMY     2000  . CARDIAC CATHETERIZATION    . CARDIAC CATHETERIZATION N/A 05/02/2015   Procedure: Left Heart Cath and Coronary Angiography;  Surgeon: [name], MD;  Location: [place] CV LAB;  Service: Cardiovascular;  Laterality: N/A;  . colonscopy    . CORONARY ANGIOPLASTY  05/28/2014   stent placement to the mid RCA  . DILATION AND CURETTAGE OF UTERUS     several in the early 80's  . ESOPHAGOGASTRODUODENOSCOPY     2012  . EYE SURGERY     bilateral laser 2012  . EYE SURGERY     right  . GAS INSERTION  09/30/2011   Procedure: INSERTION OF GAS;  Surgeon: [name], MD;  Location: [place];  Service: Ophthalmology;  Laterality: Right;  C3F8  . GAS/FLUID EXCHANGE  09/30/2011   Procedure: GAS/FLUID EXCHANGE;  Surgeon: [name], MD;  Location: [place];  Service: Ophthalmology;  Laterality: Right;  . LEFT HEART CATHETERIZATION WITH CORONARY ANGIOGRAM N/A 05/28/2013   Procedure: LEFT HEART CATHETERIZATION WITH CORONARY ANGIOGRAM;  Surgeon: [name], MD;  Location: [facility] CATH LAB;  Service: Cardiovascular;  Laterality: N/A;  . PARS PLANA VITRECTOMY  04/22/2011   Procedure: PARS PLANA VITRECTOMY WITH 25 GAUGE;  Surgeon: [name], MD;  Location: [place];  Service: Ophthalmology;  Laterality: Left;  membrane peel, endolaser, gas fluid exchange, silicone oil, repair of complex traction retinal detachment  . PARS PLANA VITRECTOMY  09/30/2011   Procedure: PARS PLANA VITRECTOMY WITH 25 GAUGE;  Surgeon: [name], MD;  Location: [place];  Service: Ophthalmology;  Laterality: Right;  Endolaser; Repair of Complex Traction Retinal Detachment  . PARS PLANA VITRECTOMY  02/24/2012   Procedure: PARS PLANA VITRECTOMY WITH 25 GAUGE;  Surgeon: [name], MD;  Location: [place];  Service: Ophthalmology;  Laterality: Left;  . PTCA    . SILICON OIL REMOVAL  [id number]   Procedure: SILICON OIL REMOVAL;  Surgeon: [name], MD;  Location: [place];  Service: Ophthalmology;  Laterality: Left;  . THROMBECTOMY / ARTERIOVENOUS GRAFT REVISION    . TUBAL LIGATION     1979    Current Outpatient Rx  . Order #: [id number] Class: Historical Med  . Order #: [id number] Class: Historical Med  . Order #: [id number] Class: Historical Med  . Order #: [id number] Class: Historical Med  . Order #: [id number] Class: Normal  . Order #: [id number] Class: Normal  . Order #: [id number] Class: Normal  . Order #: [id number] Class: Historical Med  . Order #: [id number] Class: Historical Med  . Order #: [id number] Class: Historical Med  . Order #: [id number] Class: Normal  . Order #: [id number] Class: Print  . Order #: [id number] Class: Print  Albuterol, aspirin, carvedilol, Catapres, Crestor, Lasix, melatonin, nitroglycerin, Lyrica  Allergies Metformin and  Hydrocodone  Family History  Problem Relation Age of Onset  . Stroke Mother   . Heart attack Mother   . Heart disease Mother   . Colon cancer Father   . Colon cancer Sister   . Heart attack Brother   . Stroke Brother   . Diabetes Brother   . Breast cancer Sister   . Diabetes Brother   . Diabetes Daughter   . Anesthesia problems Neg Hx   . Hypotension Neg Hx   . Malignant hyperthermia Neg Hx   . Pseudochol deficiency Neg Hx     Social History Social History  Substance Use Topics  . Smoking status: Former Smoker    Packs/day: 1.00    Years: 25.00    Quit date: 06/09/2010  . Smokeless tobacco: Never Used  . Alcohol use No    Review of Systems  Constitutional: Negative for fever. Eyes: Negative for visual changes. ENT: Negative for sore throat. Cardiovascular:  Positive for chest pain. Respiratory: Positive for shortness of breath. Gastrointestinal: Negative for abdominal pain, vomiting and diarrhea. Genitourinary: Negative for dysuria. Musculoskeletal: Negative for back pain. Skin: Negative for rash. Neurological: Negative for headache. 10 point Review of Systems otherwise negative ____________________________________________   PHYSICAL EXAM:  VITAL SIGNS: ED Triage Vitals  Enc Vitals Group     BP 12/30/15 0900 (!) 182/63     Pulse Rate 12/30/15 0900 78     Resp 12/30/15 0900 20     Temp 12/30/15 0900 98.5 F (36.9 C)     Temp src --      SpO2 12/30/15 0900 94 %     Weight 12/30/15 0900 179 lb (81.2 kg)     Height 12/30/15 0900 5\' 7"  (1.702 m)     Head Circumference --      Peak Flow --      Pain Score 12/30/15 0901 9     Pain Loc --      Pain Edu? --      Excl. in [place]? --      Constitutional: Alert and oriented. Well appearing and in no distress. HEENT   Head: Normocephalic and atraumatic.      Eyes: Conjunctivae are normal. PERRL. Normal extraocular movements.      Ears:         Nose: No congestion/rhinnorhea.   Mouth/Throat: Mucous  membranes are moist.   Neck: No stridor. Cardiovascular/Chest: Normal rate, regular rhythm.  No murmurs, rubs, or gallops. Respiratory: Normal respiratory effort without tachypnea nor retractions.Decreased breath sounds throughout with end expiratory wheezing. Gastrointestinal: Soft. No distention, no guarding, no rebound. Nontender.    Genitourinary/rectal:Deferred Musculoskeletal: Nontender with normal range of motion in all extremities. No joint effusions.  No lower extremity tenderness.  No edema. Neurologic:  Normal speech and language. No gross or focal neurologic deficits are appreciated. Skin:  Skin is warm, dry and intact. No rash noted. Psychiatric: Mood and affect are normal. Speech and behavior are normal. Patient exhibits appropriate insight and judgment.  ____________________________________________   EKG I, Lisa Roca, MD, the attending physician have personally viewed and interpreted all ECGs.  70 bpm. Normal sinus rhythm. Narrow QRS. Normal axis. Nonspecific ST and T wave, LVH. ____________________________________________  LABS (pertinent positives/negatives)  Labs Reviewed  BASIC METABOLIC PANEL - Abnormal; Notable for the following:       Result Value   Chloride 100 (*)    Glucose, Bld 135 (*)    BUN 39 (*)    Creatinine, Ser 8.22 (*)    GFR calc non Af Amer 5 (*)    GFR calc Af Amer 6 (*)    All other components within normal limits  CBC - Abnormal; Notable for the following:    RBC 2.30 (*)    Hemoglobin 8.2 (*)    HCT 23.3 (*)    MCV 101.2 (*)    MCH 35.6 (*)    RDW 15.7 (*)    Platelets 134 (*)    All other components within normal limits  TROPONIN I - Abnormal; Notable for the following:    Troponin I 0.03 (*)    All other components within normal limits  TROPONIN I - Abnormal; Notable for the following:    Troponin I 0.03 (*)    All other components within normal limits    ____________________________________________  RADIOLOGY All  Xrays were viewed by me. Imaging interpreted by Radiologist.  Chest two-view:IMPRESSION: 1. No acute cardiopulmonary disease. 2. Stable mild  vascular congestion without overt edema and chronic bronchitic changes. 3.  Aortic Atherosclerosis (ICD10-170.0) Electronically Signed   By: Jacqulynn Cadet M.D. __________________________________________  PROCEDURES  Procedure(s) performed: None  Critical Care performed: None  ____________________________________________   ED COURSE / ASSESSMENT AND PLAN  Pertinent labs & imaging results that were available during my care of the patient were reviewed by me and considered in my medical decision making (see chart for details).   Symptoms and clinically consistent with COPD exacerbation. EKG and troponin as well as repeat troponin are reassuring and I am less suspicious for ACS as the source of her chest discomfort and shortness of breath.  Patient felt better after DuoNeb treatment and was able to walk without any hypoxia. She will be treated as an outpatient with prednisone taper as well as continued use of her albuterol inhalers.  No infectious symptoms at this point in time and chest x-ray without infiltrate.  I don't think that she is having volume overload as the source of her symptoms.    CONSULTATIONS:  None   Patient / Family / Caregiver informed of clinical course, medical decision-making process, and agree with plan.   I discussed return precautions, follow-up instructions, and discharged instructions with patient and/or family.   ___________________________________________   FINAL CLINICAL IMPRESSION(S) / ED DIAGNOSES   Final diagnoses:  COPD exacerbation ([place])  Nonspecific chest pain              Note: This dictation was prepared with Dragon dictation. Any transcriptional errors that result from this process are unintentional    Lisa Roca, MD 12/30/15 1336

## 2015-12-30 NOTE — ED Notes (Addendum)
Pt reports she is having difficulty breathing, requesting head of bed to be lowered, also states she still has a headache. Pt placed on 2L o2

## 2015-12-30 NOTE — Discharge Instructions (Signed)
You were evaluated for shortness of breath and chest pressure, and are being treated for COPD exacerbation with prednisone taper. You should use her albuterol inhaler 2 puffs every 4 hours as needed for wheezing and shortness of breath.  I'm recommending follow-up with primary care physician as well as cardiologist.  Return to the emergency room for any worsening symptoms including trouble breathing, fever, confusion or altered mental status, palpitations, passing out, or any other symptoms concerning to you.

## 2015-12-31 DIAGNOSIS — N186 End stage renal disease: Secondary | ICD-10-CM | POA: Diagnosis not present

## 2015-12-31 DIAGNOSIS — D509 Iron deficiency anemia, unspecified: Secondary | ICD-10-CM | POA: Diagnosis not present

## 2015-12-31 DIAGNOSIS — D631 Anemia in chronic kidney disease: Secondary | ICD-10-CM | POA: Diagnosis not present

## 2015-12-31 DIAGNOSIS — N2581 Secondary hyperparathyroidism of renal origin: Secondary | ICD-10-CM | POA: Diagnosis not present

## 2015-12-31 DIAGNOSIS — Z992 Dependence on renal dialysis: Secondary | ICD-10-CM | POA: Diagnosis not present

## 2016-01-02 DIAGNOSIS — Z992 Dependence on renal dialysis: Secondary | ICD-10-CM | POA: Diagnosis not present

## 2016-01-02 DIAGNOSIS — N2581 Secondary hyperparathyroidism of renal origin: Secondary | ICD-10-CM | POA: Diagnosis not present

## 2016-01-02 DIAGNOSIS — D509 Iron deficiency anemia, unspecified: Secondary | ICD-10-CM | POA: Diagnosis not present

## 2016-01-02 DIAGNOSIS — D631 Anemia in chronic kidney disease: Secondary | ICD-10-CM | POA: Diagnosis not present

## 2016-01-02 DIAGNOSIS — N186 End stage renal disease: Secondary | ICD-10-CM | POA: Diagnosis not present

## 2016-01-04 ENCOUNTER — Ambulatory Visit (INDEPENDENT_AMBULATORY_CARE_PROVIDER_SITE_OTHER): Payer: Medicare Other | Admitting: Sports Medicine

## 2016-01-04 ENCOUNTER — Encounter: Payer: Self-pay | Admitting: Sports Medicine

## 2016-01-04 DIAGNOSIS — M79676 Pain in unspecified toe(s): Secondary | ICD-10-CM | POA: Diagnosis not present

## 2016-01-04 DIAGNOSIS — D509 Iron deficiency anemia, unspecified: Secondary | ICD-10-CM | POA: Diagnosis not present

## 2016-01-04 DIAGNOSIS — E1140 Type 2 diabetes mellitus with diabetic neuropathy, unspecified: Secondary | ICD-10-CM | POA: Diagnosis not present

## 2016-01-04 DIAGNOSIS — B351 Tinea unguium: Secondary | ICD-10-CM | POA: Diagnosis not present

## 2016-01-04 DIAGNOSIS — N2581 Secondary hyperparathyroidism of renal origin: Secondary | ICD-10-CM | POA: Diagnosis not present

## 2016-01-04 DIAGNOSIS — Z992 Dependence on renal dialysis: Secondary | ICD-10-CM | POA: Diagnosis not present

## 2016-01-04 DIAGNOSIS — D631 Anemia in chronic kidney disease: Secondary | ICD-10-CM | POA: Diagnosis not present

## 2016-01-04 DIAGNOSIS — N186 End stage renal disease: Secondary | ICD-10-CM | POA: Diagnosis not present

## 2016-01-04 NOTE — Progress Notes (Signed)
Patient ID: LAVERN JEFFRES, female   DOB: 1955-11-14, 60 y.o.   MRN: [id number]  Subjective: Theresa [name] is a 60 y.o. female patient with history of type 2 diabetes who presents to office today complaining of long, painful nails  while ambulating in shoes; unable to trim. Patient states that the glucose reading was 132 mg/dl. Patient admits was took off insulin 1 month ago. Denies  new problems. Patient denies any new cramping, numbness, burning or tingling in the legs.  A1c 5.8  Patient Active Problem List   Diagnosis Date Noted  . OSA on CPAP 12/20/2015  . Screening for breast cancer 07/27/2015  . Diastolic heart failure ([place]) 05/07/2015  . S/P coronary artery stent placement   . Neuritis or radiculitis due to rupture of lumbar intervertebral disc 11/28/2014  . Right hip pain 06/13/2014  . Routine general medical examination at a health care facility 09/02/2013  . Osteoarthritis of right hip 08/23/2013  . DDD (degenerative disc disease), lumbosacral 08/23/2013  . Coronary artery disease 06/27/2013  . Diabetic retinopathy ([place]) 05/28/2013  . Hyperlipidemia   . Essential (primary) hypertension 11/08/2012  . Diabetic neuropathy ([name]) 02/18/2012  . Anemia in chronic kidney disease (CKD) 02/18/2012  . ESRD on hemodialysis ([place]) 08/21/2011  . Diabetes mellitus, type II ([place]) 06/30/2011  . COPD (chronic obstructive pulmonary disease) ([place]) 06/13/2011   Current Outpatient Prescriptions on File Prior to Visit  Medication Sig Dispense Refill  . albuterol (PROVENTIL HFA;VENTOLIN HFA) 108 (90 BASE) MCG/ACT inhaler Inhale 2 puffs into the lungs every 6 (six) hours as needed for wheezing or shortness of breath.    Marland Kitchen albuterol (PROVENTIL) (2.5 MG/3ML) 0.083% nebulizer solution Take 2.5 mg by nebulization every 4 (four) hours as needed for wheezing or shortness of breath.    Marland Kitchen aspirin EC 81 MG tablet Take 81 mg by mouth daily.    . calcium acetate (PHOSLO) 667 MG capsule Take 2,668 mg by mouth 3  (three) times daily with meals.     . carvedilol (COREG) 12.5 MG tablet Take 1 tablet (12.5 mg total) by mouth 2 (two) times daily. 60 tablet 6  . cloNIDine (CATAPRES) 0.1 MG tablet Take 1 tablet (0.1 mg total) by mouth 2 (two) times daily. 60 tablet 11  . CRESTOR 40 MG tablet TAKE ONE TABLET BY MOUTH ONCE DAILY 90 tablet 3  . furosemide (LASIX) 80 MG tablet Take 80 mg by mouth daily.     . Melatonin 1 MG CAPS Take 1 capsule by mouth daily.    . multivitamin (RENA-VIT) TABS tablet Take 1 tablet by mouth daily.    . nitroGLYCERIN (NITROSTAT) 0.4 MG SL tablet Place 1 tablet (0.4 mg total) under the tongue every 5 (five) minutes as needed for chest pain. 25 tablet 3  . predniSONE (DELTASONE) 10 MG tablet 50 mg by mouth for 2 days 40 mg by mouth for 2 days 30 mg by mouth for 2 days 20 mg by mouth for 2 days 10 mg by mouth for 2 days 30 tablet 0  . pregabalin (LYRICA) 75 MG capsule Take 1 capsule (75 mg total) by mouth daily. 30 capsule 3   No current facility-administered medications on file prior to visit.    Allergies  Allergen Reactions  . Metformin Diarrhea  . Hydrocodone Hives    Objective: General: Patient is awake, alert, and oriented x 3 and in no acute distress.  Integument: Skin is warm, dry and supple bilateral. Nails are tender, long, thickened  and  dystrophic with subungual debris, consistent with onychomycosis, 1-5 bilateral. No signs of infection. No open lesions or preulcerative lesions present bilateral. Remaining integument unremarkable.  Vasculature:  Dorsalis Pedis pulse 2/4 bilateral. Posterior Tibial pulse  1/4 bilateral.  Capillary fill time <3 sec 1-5 bilateral. Scant hair growth to the level of the digits. Temperature gradient within normal limits. No varicosities present bilateral. No edema present bilateral.   Neurology: The patient has intact sensation measured with a 5.07/10g Semmes Weinstein Monofilament at all pedal sites bilateral . Vibratory sensation  diminished bilateral with tuning fork. No Babinski sign present bilateral.   Musculoskeletal: Mild asymptomatic hammertoes bilateral. Muscular strength 5/5 in all lower extremity muscular groups bilateral without pain or limitation on range of motion . No tenderness with calf compression bilateral.  Assessment and Plan: Problem List Items Addressed This Visit    None    Visit Diagnoses    Dermatophytosis of nail    -  Primary   Pain of toe, unspecified laterality       Type 2 diabetes mellitus with diabetic neuropathy, unspecified long term insulin use status ([place])         -Examined patient. -Discussed and educated patient on diabetic foot care, especially with  regards to the vascular, neurological and musculoskeletal systems.  -Stressed the importance of good glycemic control and the detriment of not  controlling glucose levels in relation to the foot. -Mechanically debrided all nails 1-5 bilateral using sterile nail nipper and filed with dremel without incident  -Answered all patient questions -Patient to return  in 3 months for at risk foot care -Patient advised to call the office if any problems or questions arise in the meantime.  Landis Martins, DPM

## 2016-01-07 DIAGNOSIS — N2581 Secondary hyperparathyroidism of renal origin: Secondary | ICD-10-CM | POA: Diagnosis not present

## 2016-01-07 DIAGNOSIS — Z992 Dependence on renal dialysis: Secondary | ICD-10-CM | POA: Diagnosis not present

## 2016-01-07 DIAGNOSIS — Z1231 Encounter for screening mammogram for malignant neoplasm of breast: Secondary | ICD-10-CM | POA: Diagnosis not present

## 2016-01-07 DIAGNOSIS — N186 End stage renal disease: Secondary | ICD-10-CM | POA: Diagnosis not present

## 2016-01-07 DIAGNOSIS — D509 Iron deficiency anemia, unspecified: Secondary | ICD-10-CM | POA: Diagnosis not present

## 2016-01-07 DIAGNOSIS — D631 Anemia in chronic kidney disease: Secondary | ICD-10-CM | POA: Diagnosis not present

## 2016-01-07 LAB — HM MAMMOGRAPHY

## 2016-01-08 ENCOUNTER — Ambulatory Visit (INDEPENDENT_AMBULATORY_CARE_PROVIDER_SITE_OTHER): Payer: Medicare Other | Admitting: Family

## 2016-01-08 ENCOUNTER — Encounter: Payer: Self-pay | Admitting: Family

## 2016-01-08 VITALS — BP 138/52 | HR 68 | Wt 177.0 lb

## 2016-01-08 DIAGNOSIS — E1122 Type 2 diabetes mellitus with diabetic chronic kidney disease: Secondary | ICD-10-CM

## 2016-01-08 DIAGNOSIS — G4733 Obstructive sleep apnea (adult) (pediatric): Secondary | ICD-10-CM

## 2016-01-08 DIAGNOSIS — N186 End stage renal disease: Secondary | ICD-10-CM

## 2016-01-08 DIAGNOSIS — I25111 Atherosclerotic heart disease of native coronary artery with angina pectoris with documented spasm: Secondary | ICD-10-CM

## 2016-01-08 DIAGNOSIS — Z992 Dependence on renal dialysis: Secondary | ICD-10-CM

## 2016-01-08 DIAGNOSIS — Z794 Long term (current) use of insulin: Secondary | ICD-10-CM

## 2016-01-08 DIAGNOSIS — J449 Chronic obstructive pulmonary disease, unspecified: Secondary | ICD-10-CM

## 2016-01-08 DIAGNOSIS — Z9989 Dependence on other enabling machines and devices: Secondary | ICD-10-CM

## 2016-01-08 NOTE — Assessment & Plan Note (Signed)
Stable.Patient compliant with Cipap machine.

## 2016-01-08 NOTE — Assessment & Plan Note (Signed)
Stable  Continue current regimen  

## 2016-01-08 NOTE — Assessment & Plan Note (Addendum)
No diabetic medications at this time. Per patient, last A1C drawn by [facility] was 5.8% 11/2015. Pleased with progress and following diet and exercise. Will check A1C at [facility] in a couple of months.

## 2016-01-08 NOTE — Patient Instructions (Addendum)
Pleasure meeting you.  Continue the good work.  If there is no improvement in your symptoms, or if there is any worsening of symptoms, or if you have any additional concerns, please return for re-evaluation; or, if we are closed, consider going to the Emergency Room for evaluation if symptoms urgent.  Chronic Obstructive Pulmonary Disease Chronic obstructive pulmonary disease (COPD) is a common lung condition in which airflow from the lungs is limited. COPD is a general term that can be used to describe many different lung problems that limit airflow, including both chronic bronchitis and emphysema. If you have COPD, your lung function will probably never return to normal, but there are measures you can take to improve lung function and make yourself feel better. CAUSES   Smoking (common).  Exposure to secondhand smoke.  Genetic problems.  Chronic inflammatory lung diseases or recurrent infections. SYMPTOMS  Shortness of breath, especially with physical activity.  Deep, persistent (chronic) cough with a large amount of thick mucus.  Wheezing.  Rapid breaths (tachypnea).  Gray or bluish discoloration (cyanosis) of the skin, especially in your fingers, toes, or lips.  Fatigue.  Weight loss.  Frequent infections or episodes when breathing symptoms become much worse (exacerbations).  Chest tightness. DIAGNOSIS Your health care provider will take a medical history and perform a physical examination to diagnose COPD. Additional tests for COPD may include:  Lung (pulmonary) function tests.  Chest X-ray.  CT scan.  Blood tests. TREATMENT  Treatment for COPD may include:  Inhaler and nebulizer medicines. These help manage the symptoms of COPD and make your breathing more comfortable.  Supplemental oxygen. Supplemental oxygen is only helpful if you have a low oxygen level in your blood.  Exercise and physical activity. These are beneficial for nearly all people with  COPD.  Lung surgery or transplant.  Nutrition therapy to gain weight, if you are underweight.  Pulmonary rehabilitation. This may involve working with a team of health care providers and specialists, such as respiratory, occupational, and physical therapists. HOME CARE INSTRUCTIONS  Take all medicines (inhaled or pills) as directed by your health care provider.  Avoid over-the-counter medicines or cough syrups that dry up your airway (such as antihistamines) and slow down the elimination of secretions unless instructed otherwise by your health care provider.  If you are a smoker, the most important thing that you can do is stop smoking. Continuing to smoke will cause further lung damage and breathing trouble. Ask your health care provider for help with quitting smoking. He or she can direct you to community resources or hospitals that provide support.  Avoid exposure to irritants such as smoke, chemicals, and fumes that aggravate your breathing.  Use oxygen therapy and pulmonary rehabilitation if directed by your health care provider. If you require home oxygen therapy, ask your health care provider whether you should purchase a pulse oximeter to measure your oxygen level at home.  Avoid contact with individuals who have a contagious illness.  Avoid extreme temperature and humidity changes.  Eat healthy foods. Eating smaller, more frequent meals and resting before meals may help you maintain your strength.  Stay active, but balance activity with periods of rest. Exercise and physical activity will help you maintain your ability to do things you want to do.  Preventing infection and hospitalization is very important when you have COPD. Make sure to receive all the vaccines your health care provider recommends, especially the pneumococcal and influenza vaccines. Ask your health care provider whether you need  a pneumonia vaccine.  Learn and use relaxation techniques to manage  stress.  Learn and use controlled breathing techniques as directed by your health care provider. Controlled breathing techniques include:  Pursed lip breathing. Start by breathing in (inhaling) through your nose for 1 second. Then, purse your lips as if you were going to whistle and breathe out (exhale) through the pursed lips for 2 seconds.  Diaphragmatic breathing. Start by putting one hand on your abdomen just above your waist. Inhale slowly through your nose. The hand on your abdomen should move out. Then purse your lips and exhale slowly. You should be able to feel the hand on your abdomen moving in as you exhale.  Learn and use controlled coughing to clear mucus from your lungs. Controlled coughing is a series of short, progressive coughs. The steps of controlled coughing are: 1. Lean your head slightly forward. 2. Breathe in deeply using diaphragmatic breathing. 3. Try to hold your breath for 3 seconds. 4. Keep your mouth slightly open while coughing twice. 5. Spit any mucus out into a tissue. 6. Rest and repeat the steps once or twice as needed. SEEK MEDICAL CARE IF:  You are coughing up more mucus than usual.  There is a change in the color or thickness of your mucus.  Your breathing is more labored than usual.  Your breathing is faster than usual. SEEK IMMEDIATE MEDICAL CARE IF:  You have shortness of breath while you are resting.  You have shortness of breath that prevents you from:  Being able to talk.  Performing your usual physical activities.  You have chest pain lasting longer than 5 minutes.  Your skin color is more cyanotic than usual.  You measure low oxygen saturations for longer than 5 minutes with a pulse oximeter. MAKE SURE YOU:  Understand these instructions.  Will watch your condition.  Will get help right away if you are not doing well or get worse.   This information is not intended to replace advice given to you by your health care provider.  Make sure you discuss any questions you have with your health care provider.   Document Released: 03/05/2005 Document Revised: 06/16/2014 Document Reviewed: 01/20/2013 Elsevier Interactive Patient Education 2016 [place] for Diabetes Mellitus Carbohydrate counting is a method for keeping track of the amount of carbohydrates you eat. Eating carbohydrates naturally increases the level of sugar (glucose) in your blood, so it is important for you to know the amount that is okay for you to have in every meal. Carbohydrate counting helps keep the level of glucose in your blood within normal limits. The amount of carbohydrates allowed is different for every person. A dietitian can help you calculate the amount that is right for you. Once you know the amount of carbohydrates you can have, you can count the carbohydrates in the foods you want to eat. Carbohydrates are found in the following foods:  Grains, such as breads and cereals.  Dried beans and soy products.  Starchy vegetables, such as potatoes, peas, and corn.  Fruit and fruit juices.  Milk and yogurt.  Sweets and snack foods, such as cake, cookies, candy, chips, soft drinks, and fruit drinks. CARBOHYDRATE COUNTING There are two ways to count the carbohydrates in your food. You can use either of the methods or a combination of both. Reading the "Nutrition Facts" on [place] The "Nutrition Facts" is an area that is included on the labels of almost all packaged food  and beverages in the [place]. It includes the serving size of that food or beverage and information about the nutrients in each serving of the food, including the grams (g) of carbohydrate per serving.  Decide the number of servings of this food or beverage that you will be able to eat or drink. Multiply that number of servings by the number of grams of carbohydrate that is listed on the label for that serving. The total will be the  amount of carbohydrates you will be having when you eat or drink this food or beverage. Learning Standard Serving Sizes of Food When you eat food that is not packaged or does not include "Nutrition Facts" on the label, you need to measure the servings in order to count the amount of carbohydrates.A serving of most carbohydrate-rich foods contains about 15 g of carbohydrates. The following list includes serving sizes of carbohydrate-rich foods that provide 15 g ofcarbohydrate per serving:   1 slice of bread (1 oz) or 1 six-inch tortilla.    of a hamburger bun or English muffin.  4-6 crackers.   cup unsweetened dry cereal.    cup hot cereal.   cup rice or pasta.    cup mashed potatoes or  of a large baked potato.  1 cup fresh fruit or one small piece of fruit.    cup canned or frozen fruit or fruit juice.  1 cup milk.   cup plain fat-free yogurt or yogurt sweetened with artificial sweeteners.   cup cooked dried beans or starchy vegetable, such as peas, corn, or potatoes.  Decide the number of standard-size servings that you will eat. Multiply that number of servings by 15 (the grams of carbohydrates in that serving). For example, if you eat 2 cups of strawberries, you will have eaten 2 servings and 30 g of carbohydrates (2 servings x 15 g = 30 g). For foods such as soups and casseroles, in which more than one food is mixed in, you will need to count the carbohydrates in each food that is included. EXAMPLE OF CARBOHYDRATE COUNTING Sample Dinner  3 oz chicken breast.   cup of brown rice.   cup of corn.  1 cup milk.   1 cup strawberries with sugar-free whipped topping.  Carbohydrate Calculation Step 1: Identify the foods that contain carbohydrates:   Rice.   Corn.   Milk.   Strawberries. Step 2:Calculate the number of servings eaten of each:   2 servings of rice.   1 serving of corn.   1 serving of milk.   1 serving of  strawberries. Step 3: Multiply each of those number of servings by 15 g:   2 servings of rice x 15 g = 30 g.   1 serving of corn x 15 g = 15 g.   1 serving of milk x 15 g = 15 g.   1 serving of strawberries x 15 g = 15 g. Step 4: Add together all of the amounts to find the total grams of carbohydrates eaten: 30 g + 15 g + 15 g + 15 g = 75 g.   This information is not intended to replace advice given to you by your health care provider. Make sure you discuss any questions you have with your health care provider.   Document Released: 05/26/2005 Document Revised: 06/16/2014 Document Reviewed: 04/22/2013 Elsevier Interactive Patient Education [employer].

## 2016-01-08 NOTE — Progress Notes (Signed)
Subjective:    Patient ID: Theresa [name], female    DOB: May 06, 1956, 60 y.o.   MRN: [id number]  CC: Theresa [name] is a 60 y.o. female who presents today for Follow-up on chronic conditions and to establish care  HPI: Follow-up on ER visit for COPD flare and to establish care. COPD now at baseline. No fever, chills, shortness of breath, cough. Per chart review, recently seen in emergency room for COPD exacerbation which improved after DuoNeb. EKG normal and troponins negative at that time.  Blood sugars fasting AM average 130. Eating well. Continues to do PT exercises at home. Came off insulin in June 2017. Reports A1C 5.15 November 2015.  OSA stable and continues to wear CPAP nightly.       HISTORY:  Past Medical History:  Diagnosis Date  . Anemia of chronic disease   . Carotid arterial disease ([place])    a. 02/2013 U/S: 40-59% bilat ICA stenosis - *f/u 02/2014*  . Chronic constipation   . Chronic diastolic CHF (congestive heart failure) ([place])    a. 10/2013 Echo [facility]): EF 55-60%, mod conc LVH, mod MR, mildly dil LA, mild Ao sclerosis w/o stenosis.  . Colon polyps   . COPD (chronic obstructive pulmonary disease) ([place])   . Coronary artery disease    a. 05/2013 NSTEMI/PCI: LM 20d, LAD min irregs, LCX small, nl, OM1 nl, RCA dom 16m (2.5x16 Promus DES), PDA1 80p.  . Diabetes mellitus   . Diabetic neuropathy ([place])   . Diabetic retinopathy ([place]) 05/28/2013   Hx bilat retinal detachment, proliferative diab retinopathy and bilat vitreous hemorrhage   . Emphysema   . ESRD on hemodialysis ([place])    a. [facility] in [place], [place], on a TTS schedule.  She started dialysis in Feb 2014.  Etiology of renal failure not known, likely diabetes.  Has a left upper arm AV graft.  . History of bronchitis    Mar 2012  . History of pneumonia    June 2012  . History of tobacco abuse    a. Quit 2012.  Marland Kitchen Hyperlipidemia   . Hypertension   . Moderate mitral insufficiency    a. 10/2013 Echo: EF 55-60%, mod MR.    . Peripheral vascular disease ([place])   . Sickle cell trait [facility])    Past Surgical History:  Procedure Laterality Date  . ABDOMINAL HYSTERECTOMY     2000  . CARDIAC CATHETERIZATION    . CARDIAC CATHETERIZATION N/A 05/02/2015   Procedure: Left Heart Cath and Coronary Angiography;  Surgeon: [name], MD;  Location: [place] CV LAB;  Service: Cardiovascular;  Laterality: N/A;  . colonscopy    . CORONARY ANGIOPLASTY  05/28/2014   stent placement to the mid RCA  . DILATION AND CURETTAGE OF UTERUS     several in the early 80's  . ESOPHAGOGASTRODUODENOSCOPY     2012  . EYE SURGERY     bilateral laser 2012  . EYE SURGERY     right  . GAS INSERTION  09/30/2011   Procedure: INSERTION OF GAS;  Surgeon: [name], MD;  Location: [place];  Service: Ophthalmology;  Laterality: Right;  C3F8  . GAS/FLUID EXCHANGE  09/30/2011   Procedure: GAS/FLUID EXCHANGE;  Surgeon: [name], MD;  Location: [place];  Service: Ophthalmology;  Laterality: Right;  . LEFT HEART CATHETERIZATION WITH CORONARY ANGIOGRAM N/A 05/28/2013   Procedure: LEFT HEART CATHETERIZATION WITH CORONARY ANGIOGRAM;  Surgeon: [name], MD;  Location: [facility] CATH  LAB;  Service: Cardiovascular;  Laterality: N/A;  . PARS PLANA VITRECTOMY  04/22/2011   Procedure: PARS PLANA VITRECTOMY WITH 25 GAUGE;  Surgeon: [name], MD;  Location: [place];  Service: Ophthalmology;  Laterality: Left;  membrane peel, endolaser, gas fluid exchange, silicone oil, repair of complex traction retinal detachment  . PARS PLANA VITRECTOMY  09/30/2011   Procedure: PARS PLANA VITRECTOMY WITH 25 GAUGE;  Surgeon: [name], MD;  Location: [place];  Service: Ophthalmology;  Laterality: Right;  Endolaser; Repair of Complex Traction Retinal Detachment  . PARS PLANA VITRECTOMY  02/24/2012   Procedure: PARS PLANA VITRECTOMY WITH 25 GAUGE;  Surgeon: [name], MD;  Location: [place];  Service: Ophthalmology;  Laterality: Left;  . PTCA     . SILICON OIL REMOVAL  123456   Procedure: SILICON OIL REMOVAL;  Surgeon: [name], MD;  Location: [place];  Service: Ophthalmology;  Laterality: Left;  . THROMBECTOMY / ARTERIOVENOUS GRAFT REVISION    . TUBAL LIGATION     1979   Family History  Problem Relation Age of Onset  . Stroke Mother   . Heart attack Mother   . Heart disease Mother   . Colon cancer Father   . Colon cancer Sister   . Heart attack Brother   . Stroke Brother   . Diabetes Brother   . Breast cancer Sister   . Diabetes Brother   . Diabetes Daughter   . Anesthesia problems Neg Hx   . Hypotension Neg Hx   . Malignant hyperthermia Neg Hx   . Pseudochol deficiency Neg Hx     Allergies: Hydrocodone and Metformin Current Outpatient Prescriptions on File Prior to Visit  Medication Sig Dispense Refill  . albuterol (PROVENTIL HFA;VENTOLIN HFA) 108 (90 BASE) MCG/ACT inhaler Inhale 2 puffs into the lungs every 6 (six) hours as needed for wheezing or shortness of breath.    Marland Kitchen albuterol (PROVENTIL) (2.5 MG/3ML) 0.083% nebulizer solution Take 2.5 mg by nebulization every 4 (four) hours as needed for wheezing or shortness of breath.    Marland Kitchen aspirin EC 81 MG tablet Take 81 mg by mouth daily.    . calcium acetate (PHOSLO) 667 MG capsule Take 2,668 mg by mouth 3 (three) times daily with meals.     . carvedilol (COREG) 12.5 MG tablet Take 1 tablet (12.5 mg total) by mouth 2 (two) times daily. 60 tablet 6  . cloNIDine (CATAPRES) 0.1 MG tablet Take 1 tablet (0.1 mg total) by mouth 2 (two) times daily. 60 tablet 11  . CRESTOR 40 MG tablet TAKE ONE TABLET BY MOUTH ONCE DAILY 90 tablet 3  . furosemide (LASIX) 80 MG tablet Take 80 mg by mouth daily.     . Melatonin 1 MG CAPS Take 1 capsule by mouth daily.    . multivitamin (RENA-VIT) TABS tablet Take 1 tablet by mouth daily.    . nitroGLYCERIN (NITROSTAT) 0.4 MG SL tablet Place 1 tablet (0.4 mg total) under the tongue every 5 (five) minutes as needed for chest pain. 25 tablet 3   . pregabalin (LYRICA) 75 MG capsule Take 1 capsule (75 mg total) by mouth daily. 30 capsule 3   No current facility-administered medications on file prior to visit.     Social History  Substance Use Topics  . Smoking status: Former Smoker    Packs/day: 1.00    Years: 25.00    Quit date: 06/09/2010  . Smokeless tobacco: Never Used  . Alcohol use No  Review of Systems  Constitutional: Negative for chills and fever.  Eyes: Negative for visual disturbance.  Respiratory: Negative for cough, shortness of breath and wheezing.   Cardiovascular: Negative for chest pain and palpitations.  Gastrointestinal: Negative for nausea and vomiting.  Endocrine: Negative for polydipsia, polyphagia and polyuria.      Objective:    BP (!) 138/52   Pulse 68   Wt 177 lb (80.3 kg)   SpO2 91%   BMI 27.72 kg/m    Physical Exam  Constitutional: She appears well-developed and well-nourished.  Eyes: Conjunctivae are normal.  Cardiovascular: Normal rate, regular rhythm, normal heart sounds and normal pulses.   Pulmonary/Chest: Effort normal and breath sounds normal. She has no wheezes. She has no rhonchi. She has no rales.  Neurological: She is alert.  Skin: Skin is warm and dry.  Psychiatric: She has a normal mood and affect. Her speech is normal and behavior is normal. Thought content normal.  Vitals reviewed.      Assessment & Plan:   Problem List Items Addressed This Visit      Respiratory   COPD (chronic obstructive pulmonary disease) ([place]) (Chronic)    Stable. Continue current regimen.       OSA on CPAP - Primary (Chronic)    Stable.Patient compliant with Cipap machine.         Endocrine   Diabetes mellitus, type II ([place]) (Chronic)    No diabetic medications at this time. Per patient, last A1C drawn by [facility] was 5.8% 11/2015. Pleased with progress and following diet and exercise. Will check A1C at [facility] in a couple of months.       Other Visit Diagnoses   None.       I  have discontinued Ms. Doane's predniSONE. I am also having her maintain her aspirin EC, pregabalin, albuterol, cloNIDine, albuterol, calcium acetate, multivitamin, carvedilol, CRESTOR, furosemide, nitroGLYCERIN, and Melatonin.   No orders of the defined types were placed in this encounter.   Return precautions given.   Risks, benefits, and alternatives of the medications and treatment plan prescribed today were discussed, and patient expressed understanding.   Education regarding symptom management and diagnosis given to patient on AVS.  Continue to follow with Mable Paris, FNP for routine health maintenance.   Theresa [name] and I agreed with plan.   Mable Paris, FNP  Total of 25 minutes spent with patient, greater than 50% of which was spent in discussion of diabetes, COPD, and OSA.

## 2016-01-09 DIAGNOSIS — D631 Anemia in chronic kidney disease: Secondary | ICD-10-CM | POA: Diagnosis not present

## 2016-01-09 DIAGNOSIS — D509 Iron deficiency anemia, unspecified: Secondary | ICD-10-CM | POA: Diagnosis not present

## 2016-01-09 DIAGNOSIS — R197 Diarrhea, unspecified: Secondary | ICD-10-CM | POA: Diagnosis not present

## 2016-01-09 DIAGNOSIS — Z992 Dependence on renal dialysis: Secondary | ICD-10-CM | POA: Diagnosis not present

## 2016-01-09 DIAGNOSIS — N2581 Secondary hyperparathyroidism of renal origin: Secondary | ICD-10-CM | POA: Diagnosis not present

## 2016-01-09 DIAGNOSIS — N186 End stage renal disease: Secondary | ICD-10-CM | POA: Diagnosis not present

## 2016-01-11 DIAGNOSIS — D509 Iron deficiency anemia, unspecified: Secondary | ICD-10-CM | POA: Diagnosis not present

## 2016-01-11 DIAGNOSIS — D631 Anemia in chronic kidney disease: Secondary | ICD-10-CM | POA: Diagnosis not present

## 2016-01-11 DIAGNOSIS — Z992 Dependence on renal dialysis: Secondary | ICD-10-CM | POA: Diagnosis not present

## 2016-01-11 DIAGNOSIS — N2581 Secondary hyperparathyroidism of renal origin: Secondary | ICD-10-CM | POA: Diagnosis not present

## 2016-01-11 DIAGNOSIS — R197 Diarrhea, unspecified: Secondary | ICD-10-CM | POA: Diagnosis not present

## 2016-01-11 DIAGNOSIS — N186 End stage renal disease: Secondary | ICD-10-CM | POA: Diagnosis not present

## 2016-01-14 DIAGNOSIS — N2581 Secondary hyperparathyroidism of renal origin: Secondary | ICD-10-CM | POA: Diagnosis not present

## 2016-01-14 DIAGNOSIS — D509 Iron deficiency anemia, unspecified: Secondary | ICD-10-CM | POA: Diagnosis not present

## 2016-01-14 DIAGNOSIS — N186 End stage renal disease: Secondary | ICD-10-CM | POA: Diagnosis not present

## 2016-01-14 DIAGNOSIS — D631 Anemia in chronic kidney disease: Secondary | ICD-10-CM | POA: Diagnosis not present

## 2016-01-14 DIAGNOSIS — R197 Diarrhea, unspecified: Secondary | ICD-10-CM | POA: Diagnosis not present

## 2016-01-14 DIAGNOSIS — Z992 Dependence on renal dialysis: Secondary | ICD-10-CM | POA: Diagnosis not present

## 2016-01-16 ENCOUNTER — Telehealth: Payer: Self-pay

## 2016-01-16 DIAGNOSIS — D509 Iron deficiency anemia, unspecified: Secondary | ICD-10-CM | POA: Diagnosis not present

## 2016-01-16 DIAGNOSIS — Z992 Dependence on renal dialysis: Secondary | ICD-10-CM | POA: Diagnosis not present

## 2016-01-16 DIAGNOSIS — R197 Diarrhea, unspecified: Secondary | ICD-10-CM | POA: Diagnosis not present

## 2016-01-16 DIAGNOSIS — N186 End stage renal disease: Secondary | ICD-10-CM | POA: Diagnosis not present

## 2016-01-16 DIAGNOSIS — D631 Anemia in chronic kidney disease: Secondary | ICD-10-CM | POA: Diagnosis not present

## 2016-01-16 DIAGNOSIS — N2581 Secondary hyperparathyroidism of renal origin: Secondary | ICD-10-CM | POA: Diagnosis not present

## 2016-01-16 NOTE — Telephone Encounter (Signed)
-----   Message from Burnard Hawthorne, [place] sent at 01/15/2016  7:37 AM EDT ----- Please call patient and schedule an appointment for earlier than October ( scheduled then currently).   I have received a letter for documentation for her CPAP device and she needs to have an assessment done within a certain time frame. See if we can see her in September so that I can fax information back to [facility].   Thanks

## 2016-01-16 NOTE — Telephone Encounter (Signed)
Spoke to patient and informed her of the information and scheduled her for [id number] @ 0900.

## 2016-01-18 DIAGNOSIS — D509 Iron deficiency anemia, unspecified: Secondary | ICD-10-CM | POA: Diagnosis not present

## 2016-01-18 DIAGNOSIS — N186 End stage renal disease: Secondary | ICD-10-CM | POA: Diagnosis not present

## 2016-01-18 DIAGNOSIS — R197 Diarrhea, unspecified: Secondary | ICD-10-CM | POA: Diagnosis not present

## 2016-01-18 DIAGNOSIS — Z992 Dependence on renal dialysis: Secondary | ICD-10-CM | POA: Diagnosis not present

## 2016-01-18 DIAGNOSIS — D631 Anemia in chronic kidney disease: Secondary | ICD-10-CM | POA: Diagnosis not present

## 2016-01-18 DIAGNOSIS — N2581 Secondary hyperparathyroidism of renal origin: Secondary | ICD-10-CM | POA: Diagnosis not present

## 2016-01-21 DIAGNOSIS — D631 Anemia in chronic kidney disease: Secondary | ICD-10-CM | POA: Diagnosis not present

## 2016-01-21 DIAGNOSIS — N186 End stage renal disease: Secondary | ICD-10-CM | POA: Diagnosis not present

## 2016-01-21 DIAGNOSIS — N2581 Secondary hyperparathyroidism of renal origin: Secondary | ICD-10-CM | POA: Diagnosis not present

## 2016-01-21 DIAGNOSIS — R197 Diarrhea, unspecified: Secondary | ICD-10-CM | POA: Diagnosis not present

## 2016-01-21 DIAGNOSIS — Z992 Dependence on renal dialysis: Secondary | ICD-10-CM | POA: Diagnosis not present

## 2016-01-21 DIAGNOSIS — D509 Iron deficiency anemia, unspecified: Secondary | ICD-10-CM | POA: Diagnosis not present

## 2016-01-22 DIAGNOSIS — N2581 Secondary hyperparathyroidism of renal origin: Secondary | ICD-10-CM | POA: Diagnosis not present

## 2016-01-22 DIAGNOSIS — Z992 Dependence on renal dialysis: Secondary | ICD-10-CM | POA: Diagnosis not present

## 2016-01-22 DIAGNOSIS — N186 End stage renal disease: Secondary | ICD-10-CM | POA: Diagnosis not present

## 2016-01-22 DIAGNOSIS — D631 Anemia in chronic kidney disease: Secondary | ICD-10-CM | POA: Diagnosis not present

## 2016-01-22 DIAGNOSIS — D509 Iron deficiency anemia, unspecified: Secondary | ICD-10-CM | POA: Diagnosis not present

## 2016-01-22 DIAGNOSIS — R197 Diarrhea, unspecified: Secondary | ICD-10-CM | POA: Diagnosis not present

## 2016-01-23 DIAGNOSIS — N2581 Secondary hyperparathyroidism of renal origin: Secondary | ICD-10-CM | POA: Diagnosis not present

## 2016-01-23 DIAGNOSIS — N186 End stage renal disease: Secondary | ICD-10-CM | POA: Diagnosis not present

## 2016-01-23 DIAGNOSIS — R197 Diarrhea, unspecified: Secondary | ICD-10-CM | POA: Diagnosis not present

## 2016-01-23 DIAGNOSIS — D631 Anemia in chronic kidney disease: Secondary | ICD-10-CM | POA: Diagnosis not present

## 2016-01-23 DIAGNOSIS — Z992 Dependence on renal dialysis: Secondary | ICD-10-CM | POA: Diagnosis not present

## 2016-01-23 DIAGNOSIS — D509 Iron deficiency anemia, unspecified: Secondary | ICD-10-CM | POA: Diagnosis not present

## 2016-01-24 ENCOUNTER — Ambulatory Visit: Payer: Medicare Other | Admitting: Cardiovascular Disease

## 2016-01-25 DIAGNOSIS — N186 End stage renal disease: Secondary | ICD-10-CM | POA: Diagnosis not present

## 2016-01-25 DIAGNOSIS — D509 Iron deficiency anemia, unspecified: Secondary | ICD-10-CM | POA: Diagnosis not present

## 2016-01-25 DIAGNOSIS — Z992 Dependence on renal dialysis: Secondary | ICD-10-CM | POA: Diagnosis not present

## 2016-01-25 DIAGNOSIS — D631 Anemia in chronic kidney disease: Secondary | ICD-10-CM | POA: Diagnosis not present

## 2016-01-25 DIAGNOSIS — R197 Diarrhea, unspecified: Secondary | ICD-10-CM | POA: Diagnosis not present

## 2016-01-25 DIAGNOSIS — N2581 Secondary hyperparathyroidism of renal origin: Secondary | ICD-10-CM | POA: Diagnosis not present

## 2016-01-28 ENCOUNTER — Telehealth: Payer: Self-pay

## 2016-01-28 DIAGNOSIS — N186 End stage renal disease: Secondary | ICD-10-CM | POA: Diagnosis not present

## 2016-01-28 DIAGNOSIS — D509 Iron deficiency anemia, unspecified: Secondary | ICD-10-CM | POA: Diagnosis not present

## 2016-01-28 DIAGNOSIS — R197 Diarrhea, unspecified: Secondary | ICD-10-CM | POA: Diagnosis not present

## 2016-01-28 DIAGNOSIS — N2581 Secondary hyperparathyroidism of renal origin: Secondary | ICD-10-CM | POA: Diagnosis not present

## 2016-01-28 DIAGNOSIS — Z992 Dependence on renal dialysis: Secondary | ICD-10-CM | POA: Diagnosis not present

## 2016-01-28 DIAGNOSIS — D631 Anemia in chronic kidney disease: Secondary | ICD-10-CM | POA: Diagnosis not present

## 2016-01-28 NOTE — Telephone Encounter (Signed)
LMTCB. Regarding a fax for TENS Unit

## 2016-01-30 DIAGNOSIS — D631 Anemia in chronic kidney disease: Secondary | ICD-10-CM | POA: Diagnosis not present

## 2016-01-30 DIAGNOSIS — R197 Diarrhea, unspecified: Secondary | ICD-10-CM | POA: Diagnosis not present

## 2016-01-30 DIAGNOSIS — N2581 Secondary hyperparathyroidism of renal origin: Secondary | ICD-10-CM | POA: Diagnosis not present

## 2016-01-30 DIAGNOSIS — N186 End stage renal disease: Secondary | ICD-10-CM | POA: Diagnosis not present

## 2016-01-30 DIAGNOSIS — D509 Iron deficiency anemia, unspecified: Secondary | ICD-10-CM | POA: Diagnosis not present

## 2016-01-30 DIAGNOSIS — Z992 Dependence on renal dialysis: Secondary | ICD-10-CM | POA: Diagnosis not present

## 2016-01-31 ENCOUNTER — Ambulatory Visit: Payer: Medicare Other | Admitting: Internal Medicine

## 2016-01-31 ENCOUNTER — Telehealth: Payer: Self-pay | Admitting: Family

## 2016-01-31 NOTE — Telephone Encounter (Signed)
Information has been faxed electronically.

## 2016-01-31 NOTE — Telephone Encounter (Signed)
Pt called stating that Theresa [name] needs a copy of pt last two doctors visits. Fax to [phone] Atten: [employer]. They also want to know the usage of pt Cpap. Pt stated she was not using it because she had a real bad cold she has been using the oxygen.   Call pt @ [phone]. Thank you!

## 2016-02-01 DIAGNOSIS — D631 Anemia in chronic kidney disease: Secondary | ICD-10-CM | POA: Diagnosis not present

## 2016-02-01 DIAGNOSIS — Z992 Dependence on renal dialysis: Secondary | ICD-10-CM | POA: Diagnosis not present

## 2016-02-01 DIAGNOSIS — N186 End stage renal disease: Secondary | ICD-10-CM | POA: Diagnosis not present

## 2016-02-01 DIAGNOSIS — N2581 Secondary hyperparathyroidism of renal origin: Secondary | ICD-10-CM | POA: Diagnosis not present

## 2016-02-01 DIAGNOSIS — D509 Iron deficiency anemia, unspecified: Secondary | ICD-10-CM | POA: Diagnosis not present

## 2016-02-01 DIAGNOSIS — R197 Diarrhea, unspecified: Secondary | ICD-10-CM | POA: Diagnosis not present

## 2016-02-04 DIAGNOSIS — R197 Diarrhea, unspecified: Secondary | ICD-10-CM | POA: Diagnosis not present

## 2016-02-04 DIAGNOSIS — Z992 Dependence on renal dialysis: Secondary | ICD-10-CM | POA: Diagnosis not present

## 2016-02-04 DIAGNOSIS — N2581 Secondary hyperparathyroidism of renal origin: Secondary | ICD-10-CM | POA: Diagnosis not present

## 2016-02-04 DIAGNOSIS — D509 Iron deficiency anemia, unspecified: Secondary | ICD-10-CM | POA: Diagnosis not present

## 2016-02-04 DIAGNOSIS — N186 End stage renal disease: Secondary | ICD-10-CM | POA: Diagnosis not present

## 2016-02-04 DIAGNOSIS — D631 Anemia in chronic kidney disease: Secondary | ICD-10-CM | POA: Diagnosis not present

## 2016-02-04 NOTE — Progress Notes (Signed)
Cardiology Office Note Date:  02/05/2016  Patient ID:  Theresa [name], Theresa [name] 1956/06/01, MRN [id number] PCP:  Mable Paris, FNP  Cardiologist:  Dr. Rockey Situ, MD    Chief Complaint: Follow up  History of Present Illness: Theresa [name] is a 60 y.o. female with history of CAD s/p PCI/DES to mid RCA in 05/2013, chronic combined systolic and diastolic CHF/ICM now with normalized EF by echo 11/2014, moderate to severe MR, ESRD on HD (MWF), prior tobacco abuse, bilateral carotid artery disease/PVD, HTN, COPD, anemia of chronic disease, and DM2 with diabetic neuropathy/retinopathy who presents for follow up.   She was admitted in 05/2013 for PNA. She was found to have elevated cardiac enzymes and underwent cardiac cath that showed 95% mid RCA stenosis s/p PCI/DES with 2.5 x 16 mm Promus stent. Prior echo from 11/10/2010 showed EF 45-50%, mildly dilated LV, mild MS, moderate to severe MR, mild to moderate TR. Most recent echo from 11/24/2014 showed an EF of 60-65%, normal wall motion, GR2DD, moderate MR, mildly dilated left atrium.   Prior stress test on 04/17/2015 as part of her preoperative evaluation for possible renal transplant that showed a moderate severity reversible defect of moderate size of the inferior wall c/w ischemia. There was also a defect noted in the apical, septal, anteroseptal region. EF 60%. Because of her abnormal stress test she underwent LHC on 05/02/2015 that showed significant one-vessel CAD with patent stent in the mid RCA. There was 70% ostial RPDA stenosis which was unchanged from prior cardiac cath in 2014. A faint collateral was noted from right to left supplying what seemed to be a small septal branch of the LAD. The left coronary tree had mild nonobstructive disease. Normal LV systolic function with severely elevated BP with moderately elevated LVEDP. It was felt her dyspnea was 2/2 chronic diastolic CHF with uncontrolled HTN. Medical management was advised. She was admitted on  05/07/2015 for acute respiratory distress requiring BiPAP and malignant HTN treated by inpatient HD and titration of antihypertensives. She has most recently been following the [facility], last being seen on 12/26/2015, noting fatigue with moderate exertion at that time. Weight was slightly increased, up 3 pounds on their scales from April 2017 to July 2017. BP 99991111 systolic. No changes were made. She had an ED visit in late July 2017 for COPD exacerbation with negative cardiac enzymes and non-acute EKG.    Today, she reports she is doing well. Her weight has been stable in the 174 to 177 pound range. When she was placed on prednisone for the above COPD exacerbation in July her weight trended up to 179 pounds and extra fluid was taken off during her HD sessions. She is now back to baseline fluid removal. She denies any orthopnea or early satiety. No LE swelling. No dizziness. Can reports being able to ambulate around the perimeter of [employer] prior to getting SOB. No chest pain. BP during HD sessions lately have been quite elevated, into the 123456 systolic. BP readings at home stay in the 99991111 systolic.    Past Medical History:  Diagnosis Date  . Anemia of chronic disease   . Carotid arterial disease ([place])    a. 02/2013 U/S: 40-59% bilat ICA stenosis - *f/u 02/2014*  . Chronic constipation   . Chronic diastolic CHF (congestive heart failure) ([place])    a. 10/2013 Echo [facility]): EF 55-60%, mod conc LVH, mod MR, mildly dil LA, mild Ao sclerosis w/o stenosis.  . Colon polyps   .  COPD (chronic obstructive pulmonary disease) ([place])   . Coronary artery disease    a. 05/2013 NSTEMI/PCI: LM 20d, LAD min irregs, LCX small, nl, OM1 nl, RCA dom 59m (2.5x16 Promus DES), PDA1 80p.  . Diabetes mellitus   . Diabetic neuropathy ([place])   . Diabetic retinopathy ([place]) 05/28/2013   Hx bilat retinal detachment, proliferative diab retinopathy and bilat vitreous hemorrhage   . Emphysema   . ESRD on hemodialysis  ([place])    a. [facility] in [place], [place], on a TTS schedule.  She started dialysis in Feb 2014.  Etiology of renal failure not known, likely diabetes.  Has a left upper arm AV graft.  . History of bronchitis    Mar 2012  . History of pneumonia    June 2012  . History of tobacco abuse    a. Quit 2012.  Marland Kitchen Hyperlipidemia   . Hypertension   . Moderate mitral insufficiency    a. 10/2013 Echo: EF 55-60%, mod MR.  . Peripheral vascular disease ([place])   . Sickle cell trait [facility])     Past Surgical History:  Procedure Laterality Date  . ABDOMINAL HYSTERECTOMY     2000  . CARDIAC CATHETERIZATION    . CARDIAC CATHETERIZATION N/A 05/02/2015   Procedure: Left Heart Cath and Coronary Angiography;  Surgeon: [name], MD;  Location: [place] CV LAB;  Service: Cardiovascular;  Laterality: N/A;  . colonscopy    . CORONARY ANGIOPLASTY  05/28/2014   stent placement to the mid RCA  . DILATION AND CURETTAGE OF UTERUS     several in the early 80's  . ESOPHAGOGASTRODUODENOSCOPY     2012  . EYE SURGERY     bilateral laser 2012  . EYE SURGERY     right  . GAS INSERTION  09/30/2011   Procedure: INSERTION OF GAS;  Surgeon: [name], MD;  Location: [place];  Service: Ophthalmology;  Laterality: Right;  C3F8  . GAS/FLUID EXCHANGE  09/30/2011   Procedure: GAS/FLUID EXCHANGE;  Surgeon: [name], MD;  Location: [place];  Service: Ophthalmology;  Laterality: Right;  . LEFT HEART CATHETERIZATION WITH CORONARY ANGIOGRAM N/A 05/28/2013   Procedure: LEFT HEART CATHETERIZATION WITH CORONARY ANGIOGRAM;  Surgeon: [name], MD;  Location: [facility] CATH LAB;  Service: Cardiovascular;  Laterality: N/A;  . PARS PLANA VITRECTOMY  04/22/2011   Procedure: PARS PLANA VITRECTOMY WITH 25 GAUGE;  Surgeon: [name], MD;  Location: [place];  Service: Ophthalmology;  Laterality: Left;  membrane peel, endolaser, gas fluid exchange, silicone oil, repair of complex traction retinal detachment  . PARS PLANA  VITRECTOMY  09/30/2011   Procedure: PARS PLANA VITRECTOMY WITH 25 GAUGE;  Surgeon: [name], MD;  Location: [place];  Service: Ophthalmology;  Laterality: Right;  Endolaser; Repair of Complex Traction Retinal Detachment  . PARS PLANA VITRECTOMY  02/24/2012   Procedure: PARS PLANA VITRECTOMY WITH 25 GAUGE;  Surgeon: [name], MD;  Location: [place];  Service: Ophthalmology;  Laterality: Left;  . PTCA    . SILICON OIL REMOVAL  123456   Procedure: SILICON OIL REMOVAL;  Surgeon: [name], MD;  Location: [place];  Service: Ophthalmology;  Laterality: Left;  . THROMBECTOMY / ARTERIOVENOUS GRAFT REVISION    . TUBAL LIGATION     1979    Current Outpatient Prescriptions  Medication Sig Dispense Refill  . albuterol (PROVENTIL HFA;VENTOLIN HFA) 108 (90 BASE) MCG/ACT inhaler Inhale 2 puffs into the lungs every 6 (six)  hours as needed for wheezing or shortness of breath.    Marland Kitchen albuterol (PROVENTIL) (2.5 MG/3ML) 0.083% nebulizer solution Take 2.5 mg by nebulization every 4 (four) hours as needed for wheezing or shortness of breath.    Marland Kitchen aspirin EC 81 MG tablet Take 81 mg by mouth daily.    . calcium acetate (PHOSLO) 667 MG capsule Take 2,668 mg by mouth 3 (three) times daily with meals.     . carvedilol (COREG) 12.5 MG tablet Take 1 tablet (12.5 mg total) by mouth 2 (two) times daily. 60 tablet 6  . cloNIDine (CATAPRES) 0.1 MG tablet Take 1 tablet (0.1 mg total) by mouth 2 (two) times daily. 60 tablet 11  . CRESTOR 40 MG tablet TAKE ONE TABLET BY MOUTH ONCE DAILY 90 tablet 3  . furosemide (LASIX) 80 MG tablet Take 80 mg by mouth daily.     . Melatonin 1 MG CAPS Take 1 capsule by mouth daily.    . multivitamin (RENA-VIT) TABS tablet Take 1 tablet by mouth daily.    . nitroGLYCERIN (NITROSTAT) 0.4 MG SL tablet Place 1 tablet (0.4 mg total) under the tongue every 5 (five) minutes as needed for chest pain. 25 tablet 3  . pregabalin (LYRICA) 75 MG capsule Take 1 capsule (75 mg total) by mouth  daily. 30 capsule 3   No current facility-administered medications for this visit.     Allergies:   Hydrocodone and Metformin   Social History:  The patient  reports that she quit smoking about 5 years ago. She has a 25.00 pack-year smoking history. She has never used smokeless tobacco. She reports that she does not drink alcohol or use drugs.   Family History:  The patient's family history includes Breast cancer in her sister; Colon cancer in her father and sister; Diabetes in her brother, brother, and daughter; Heart attack in her brother and mother; Heart disease in her mother; Stroke in her brother and mother.  ROS:   Review of Systems  Constitutional: Positive for malaise/fatigue. Negative for chills, diaphoresis, fever and weight loss.  HENT: Negative for congestion.   Eyes: Negative for discharge and redness.  Respiratory: Positive for shortness of breath. Negative for cough, sputum production and wheezing.   Cardiovascular: Negative for chest pain, palpitations, orthopnea, claudication, leg swelling and PND.  Gastrointestinal: Negative for abdominal pain, heartburn, nausea and vomiting.  Musculoskeletal: Negative for falls and myalgias.  Skin: Negative for rash.  Neurological: Negative for dizziness, tingling, tremors, sensory change, speech change, focal weakness, loss of consciousness and weakness.  Endo/Heme/Allergies: Does not bruise/bleed easily.  Psychiatric/Behavioral: Negative for substance abuse. The patient is not nervous/anxious.      PHYSICAL EXAM:  VS:  BP (!) 142/64 (BP Location: Right Arm, Patient Position: Sitting, Cuff Size: Normal)   Pulse 77   Ht 5' 7.5" (1.715 m)   Wt 177 lb (80.3 kg)   BMI 27.31 kg/m  BMI: Body mass index is 27.31 kg/m.  Physical Exam  Constitutional: She is oriented to person, place, and time. She appears well-developed and well-nourished.  HENT:  Head: Normocephalic and atraumatic.  Eyes: Right eye exhibits no discharge. Left eye  exhibits no discharge.  Neck: Normal range of motion. No JVD present. Carotid bruit is present.  Cardiovascular: Normal rate, regular rhythm, S1 normal and S2 normal.  Exam reveals no distant heart sounds, no friction rub, no midsystolic click and no opening snap.   Murmur heard.  Holosystolic murmur is present with a grade of  3/6  at the upper right sternal border, apex radiating to the neck, axilla, back Pulses:      Posterior tibial pulses are 2+ on the right side, and 2+ on the left side.  Pulmonary/Chest: Effort normal and breath sounds normal. No respiratory distress. She has no decreased breath sounds. She has no wheezes. She has no rales. She exhibits no tenderness.  Abdominal: Soft. She exhibits no distension. There is no tenderness.  Musculoskeletal: She exhibits no edema.  Neurological: She is alert and oriented to person, place, and time.  Skin: Skin is warm and dry. No cyanosis. Nails show no clubbing.  Psychiatric: She has a normal mood and affect. Her speech is normal and behavior is normal. Judgment and thought content normal.     EKG:  Was ordered and interpreted by me today. Shows NSR, 77 bpm, prior inferior infarct, LVH, no acute st/t changes   Recent Labs: 05/02/2015: B Natriuretic Peptide 703.0 07/27/2015: ALT 24 12/30/2015: BUN 39; Creatinine, Ser 8.22; Hemoglobin 8.2; Platelets 134; Potassium 3.7; Sodium 140  No results found for requested labs within last 8760 hours.   CrCl cannot be calculated (Patient's most recent lab result is older than the maximum 21 days allowed.).   Wt Readings from Last 3 Encounters:  02/05/16 177 lb (80.3 kg)  01/08/16 177 lb (80.3 kg)  12/30/15 179 lb (81.2 kg)     Other studies reviewed: Additional studies/records reviewed today include: summarized above  ASSESSMENT AND PLAN:  1. CAD s/p PCI as above: No symptoms concerning for angina. Continue aspirin 81 mg daily, Coreg as below, and Crestor. No plans for further ischemic work  up at this time unless dictated by her valvular disease on echo.   2. Chronic combined CHF: She does not appear to be volume overloaded at this time. Fluid managed per renal through HD. Continue Coreg as below. She also continues to make some urine. Continue Lasix 80 mg daily. Limit salt and PO fluid consumption.   3. Moderate mitral regurgitation: Recheck echo to trend velocity of MR. Last echo 11/2014 as above.   4. ESRD on HD: MWF. Per Renal.   5. HTN: Readings have consistently been in the [id number] to 99991111 systolic. During her HD sessions she reports readings into the 123456 systolic. Increase Coreg to 25 mg bid for SBP > 160 mm Hg. If BP is less than 0000000 mm Hg systolic she is to take only 12.5 mg bid. Continue clonidine.   6. Anemia of chronic disease: This may be playing a role in some of her fatigue.   7. Fatigue: Likely multifactorial including CHF, ESRD, anemia of chronic disease, and valvular heart disease.   8. Carotid artery stenosis: Check carotid ultrasound.   Disposition: F/u with Dr. Rockey Situ, MD in 3 months, sooner if dictated by echo/carotid ultrasound  Current medicines are reviewed at length with the patient today.  The patient did not have any concerns regarding medicines.  Melvern Banker PA-C 02/05/2016 2:22 PM     [address], [place] [id number] [phone]

## 2016-02-05 ENCOUNTER — Ambulatory Visit (INDEPENDENT_AMBULATORY_CARE_PROVIDER_SITE_OTHER): Payer: Medicare Other | Admitting: Physician Assistant

## 2016-02-05 ENCOUNTER — Other Ambulatory Visit: Payer: Self-pay | Admitting: Family

## 2016-02-05 ENCOUNTER — Encounter: Payer: Self-pay | Admitting: Physician Assistant

## 2016-02-05 VITALS — BP 142/64 | HR 77 | Ht 67.5 in | Wt 177.0 lb

## 2016-02-05 DIAGNOSIS — I10 Essential (primary) hypertension: Secondary | ICD-10-CM

## 2016-02-05 DIAGNOSIS — N186 End stage renal disease: Secondary | ICD-10-CM

## 2016-02-05 DIAGNOSIS — I2510 Atherosclerotic heart disease of native coronary artery without angina pectoris: Secondary | ICD-10-CM

## 2016-02-05 DIAGNOSIS — Z992 Dependence on renal dialysis: Secondary | ICD-10-CM

## 2016-02-05 DIAGNOSIS — M5416 Radiculopathy, lumbar region: Secondary | ICD-10-CM | POA: Diagnosis not present

## 2016-02-05 DIAGNOSIS — I779 Disorder of arteries and arterioles, unspecified: Secondary | ICD-10-CM

## 2016-02-05 DIAGNOSIS — I739 Peripheral vascular disease, unspecified: Secondary | ICD-10-CM

## 2016-02-05 DIAGNOSIS — I340 Nonrheumatic mitral (valve) insufficiency: Secondary | ICD-10-CM

## 2016-02-05 DIAGNOSIS — I5032 Chronic diastolic (congestive) heart failure: Secondary | ICD-10-CM | POA: Diagnosis not present

## 2016-02-05 DIAGNOSIS — R0989 Other specified symptoms and signs involving the circulatory and respiratory systems: Secondary | ICD-10-CM

## 2016-02-05 DIAGNOSIS — M5136 Other intervertebral disc degeneration, lumbar region: Secondary | ICD-10-CM | POA: Diagnosis not present

## 2016-02-05 MED ORDER — CARVEDILOL 12.5 MG PO TABS: ORAL_TABLET | ORAL | 3 refills | Status: DC

## 2016-02-05 NOTE — Patient Instructions (Addendum)
Medication Instructions:  Your physician has recommended you make the following change in your medication:  TAKE coreg as follows: Take 12.5mg  one-to-two tablets daily depending on BP If systolic BP (top number) is greater than 160, take coreg twice daily    Labwork: none  Testing/Procedures: Your physician has requested that you have an echocardiogram. Echocardiography is a painless test that uses sound waves to create images of your heart. It provides your doctor with information about the size and shape of your heart and how well your heart's chambers and valves are working. This procedure takes approximately one hour. There are no restrictions for this procedure.  Your physician has requested that you have a carotid duplex. This test is an ultrasound of the carotid arteries in your neck. It looks at blood flow through these arteries that supply the brain with blood. Allow one hour for this exam. There are no restrictions or special instructions.    Follow-Up: Your physician recommends that you schedule a follow-up appointment in: 3 months with Dr. Rockey Situ.     Any Other Special Instructions Will Be Listed Below (If Applicable).     If you need a refill on your cardiac medications before your next appointment, please call your pharmacy.  Echocardiogram An echocardiogram, or echocardiography, uses sound waves (ultrasound) to produce an image of your heart. The echocardiogram is simple, painless, obtained within a short period of time, and offers valuable information to your health care provider. The images from an echocardiogram can provide information such as:  Evidence of coronary artery disease (CAD).  Heart size.  Heart muscle function.  Heart valve function.  Aneurysm detection.  Evidence of a past heart attack.  Fluid buildup around the heart.  Heart muscle thickening.  Assess heart valve function. LET [facility] CARE PROVIDER KNOW ABOUT:  Any allergies you  have.  All medicines you are taking, including vitamins, herbs, eye drops, creams, and over-the-counter medicines.  Previous problems you or members of your family have had with the use of anesthetics.  Any blood disorders you have.  Previous surgeries you have had.  Medical conditions you have.  Possibility of pregnancy, if this applies. BEFORE THE PROCEDURE  No special preparation is needed. Eat and drink normally.  PROCEDURE   In order to produce an image of your heart, gel will be applied to your chest and a wand-like tool (transducer) will be moved over your chest. The gel will help transmit the sound waves from the transducer. The sound waves will harmlessly bounce off your heart to allow the heart images to be captured in real-time motion. These images will then be recorded.  You may need an IV to receive a medicine that improves the quality of the pictures. AFTER THE PROCEDURE You may return to your normal schedule including diet, activities, and medicines, unless your health care provider tells you otherwise.   This information is not intended to replace advice given to you by your health care provider. Make sure you discuss any questions you have with your health care provider.   Document Released: 05/23/2000 Document Revised: 06/16/2014 Document Reviewed: 01/31/2013 Elsevier Interactive Patient Education [employer].

## 2016-02-06 ENCOUNTER — Encounter: Payer: Self-pay | Admitting: Family

## 2016-02-06 DIAGNOSIS — R197 Diarrhea, unspecified: Secondary | ICD-10-CM | POA: Diagnosis not present

## 2016-02-06 DIAGNOSIS — D509 Iron deficiency anemia, unspecified: Secondary | ICD-10-CM | POA: Diagnosis not present

## 2016-02-06 DIAGNOSIS — N186 End stage renal disease: Secondary | ICD-10-CM | POA: Diagnosis not present

## 2016-02-06 DIAGNOSIS — Z992 Dependence on renal dialysis: Secondary | ICD-10-CM | POA: Diagnosis not present

## 2016-02-06 DIAGNOSIS — D631 Anemia in chronic kidney disease: Secondary | ICD-10-CM | POA: Diagnosis not present

## 2016-02-06 DIAGNOSIS — N2581 Secondary hyperparathyroidism of renal origin: Secondary | ICD-10-CM | POA: Diagnosis not present

## 2016-02-06 DIAGNOSIS — I340 Nonrheumatic mitral (valve) insufficiency: Secondary | ICD-10-CM | POA: Insufficient documentation

## 2016-02-07 DIAGNOSIS — Z992 Dependence on renal dialysis: Secondary | ICD-10-CM | POA: Diagnosis not present

## 2016-02-07 DIAGNOSIS — N186 End stage renal disease: Secondary | ICD-10-CM | POA: Diagnosis not present

## 2016-02-08 ENCOUNTER — Encounter: Payer: Self-pay | Admitting: *Deleted

## 2016-02-08 ENCOUNTER — Other Ambulatory Visit: Payer: Self-pay | Admitting: *Deleted

## 2016-02-08 DIAGNOSIS — Z23 Encounter for immunization: Secondary | ICD-10-CM | POA: Diagnosis not present

## 2016-02-08 DIAGNOSIS — D509 Iron deficiency anemia, unspecified: Secondary | ICD-10-CM | POA: Diagnosis not present

## 2016-02-08 DIAGNOSIS — Z992 Dependence on renal dialysis: Secondary | ICD-10-CM | POA: Diagnosis not present

## 2016-02-08 DIAGNOSIS — D631 Anemia in chronic kidney disease: Secondary | ICD-10-CM | POA: Diagnosis not present

## 2016-02-08 DIAGNOSIS — N2581 Secondary hyperparathyroidism of renal origin: Secondary | ICD-10-CM | POA: Diagnosis not present

## 2016-02-08 DIAGNOSIS — N186 End stage renal disease: Secondary | ICD-10-CM | POA: Diagnosis not present

## 2016-02-08 NOTE — Patient Outreach (Addendum)
[facility]) Care Management    Theresa [name] 14-Jun-1955 [id number]  Subjective: Telephone call to patient's home / mobile number, spoke with patient, and HIPAA verified.  Patient states she is doing well.   Discussed [facility] Care Management services and in agreement to complete telephone screen. Patient states she is currently on the kidney transplant list.  States she sometimes could not afford medications in the past but is ok now, and declined [facility] referral at this time.   Currently unable to use CPAP due to wearing oxygen because of nasal congestion.  States she has follow up appointment with MD to discuss congestion.    Patient states she does not have any transition of care, care coordination, disease management, disease monitoring, transportation, community resource, or pharmacy needs at this time.   In agreement to receive [facility] Care Management information.  Objective:  Per chart review: Patient had ED visit on 12/30/15 for COPD exacerbation.   Had [facility] services in the past.   Patient has a history of COPD, diabetes, chronic diastolic congestive heart failure, hypertension, hyperlipidemia, and ESRD.   Patient has dialysis on Monday, Wednesday, and Friday.  Most recent A1C was 5.8 and insulin was discontinued June of 2017.    Assessment: Received [facility] referral on 01/29/16.    Telephone screen completed and no care management needs identified.   Will proceed with case closure.   Plan: RNCM will send patient successful outreach letter, [facility] pamphlet, and magnet. RNCM will send patient's primary MD case closure letter due to no care management needs.  RNCM will send case closure due to follow up completed / no care management needs request to [facility] Setting at [facility].    Karlena Luebke H. Annia Friendly, BSN, [facility] [employer] Telephonic CM Phone: [phone] Fax: [phone]

## 2016-02-11 DIAGNOSIS — N2581 Secondary hyperparathyroidism of renal origin: Secondary | ICD-10-CM | POA: Diagnosis not present

## 2016-02-11 DIAGNOSIS — D631 Anemia in chronic kidney disease: Secondary | ICD-10-CM | POA: Diagnosis not present

## 2016-02-11 DIAGNOSIS — Z992 Dependence on renal dialysis: Secondary | ICD-10-CM | POA: Diagnosis not present

## 2016-02-11 DIAGNOSIS — D509 Iron deficiency anemia, unspecified: Secondary | ICD-10-CM | POA: Diagnosis not present

## 2016-02-11 DIAGNOSIS — Z23 Encounter for immunization: Secondary | ICD-10-CM | POA: Diagnosis not present

## 2016-02-11 DIAGNOSIS — N186 End stage renal disease: Secondary | ICD-10-CM | POA: Diagnosis not present

## 2016-02-13 DIAGNOSIS — D631 Anemia in chronic kidney disease: Secondary | ICD-10-CM | POA: Diagnosis not present

## 2016-02-13 DIAGNOSIS — N186 End stage renal disease: Secondary | ICD-10-CM | POA: Diagnosis not present

## 2016-02-13 DIAGNOSIS — D509 Iron deficiency anemia, unspecified: Secondary | ICD-10-CM | POA: Diagnosis not present

## 2016-02-13 DIAGNOSIS — N2581 Secondary hyperparathyroidism of renal origin: Secondary | ICD-10-CM | POA: Diagnosis not present

## 2016-02-13 DIAGNOSIS — Z23 Encounter for immunization: Secondary | ICD-10-CM | POA: Diagnosis not present

## 2016-02-13 DIAGNOSIS — Z992 Dependence on renal dialysis: Secondary | ICD-10-CM | POA: Diagnosis not present

## 2016-02-15 DIAGNOSIS — D631 Anemia in chronic kidney disease: Secondary | ICD-10-CM | POA: Diagnosis not present

## 2016-02-15 DIAGNOSIS — Z992 Dependence on renal dialysis: Secondary | ICD-10-CM | POA: Diagnosis not present

## 2016-02-15 DIAGNOSIS — N2581 Secondary hyperparathyroidism of renal origin: Secondary | ICD-10-CM | POA: Diagnosis not present

## 2016-02-15 DIAGNOSIS — D509 Iron deficiency anemia, unspecified: Secondary | ICD-10-CM | POA: Diagnosis not present

## 2016-02-15 DIAGNOSIS — Z23 Encounter for immunization: Secondary | ICD-10-CM | POA: Diagnosis not present

## 2016-02-15 DIAGNOSIS — N186 End stage renal disease: Secondary | ICD-10-CM | POA: Diagnosis not present

## 2016-02-17 DIAGNOSIS — D631 Anemia in chronic kidney disease: Secondary | ICD-10-CM | POA: Diagnosis not present

## 2016-02-17 DIAGNOSIS — Z992 Dependence on renal dialysis: Secondary | ICD-10-CM | POA: Diagnosis not present

## 2016-02-17 DIAGNOSIS — N186 End stage renal disease: Secondary | ICD-10-CM | POA: Diagnosis not present

## 2016-02-17 DIAGNOSIS — N2581 Secondary hyperparathyroidism of renal origin: Secondary | ICD-10-CM | POA: Diagnosis not present

## 2016-02-17 DIAGNOSIS — D509 Iron deficiency anemia, unspecified: Secondary | ICD-10-CM | POA: Diagnosis not present

## 2016-02-17 DIAGNOSIS — Z23 Encounter for immunization: Secondary | ICD-10-CM | POA: Diagnosis not present

## 2016-02-20 ENCOUNTER — Ambulatory Visit (INDEPENDENT_AMBULATORY_CARE_PROVIDER_SITE_OTHER): Payer: Medicare Other | Admitting: Family

## 2016-02-20 ENCOUNTER — Encounter: Payer: Self-pay | Admitting: Family

## 2016-02-20 VITALS — BP 152/72 | HR 71 | Temp 98.3°F | Ht 67.5 in | Wt 179.0 lb

## 2016-02-20 DIAGNOSIS — N186 End stage renal disease: Secondary | ICD-10-CM | POA: Diagnosis not present

## 2016-02-20 DIAGNOSIS — N2581 Secondary hyperparathyroidism of renal origin: Secondary | ICD-10-CM | POA: Diagnosis not present

## 2016-02-20 DIAGNOSIS — G4733 Obstructive sleep apnea (adult) (pediatric): Secondary | ICD-10-CM | POA: Diagnosis not present

## 2016-02-20 DIAGNOSIS — J449 Chronic obstructive pulmonary disease, unspecified: Secondary | ICD-10-CM | POA: Diagnosis not present

## 2016-02-20 DIAGNOSIS — I2510 Atherosclerotic heart disease of native coronary artery without angina pectoris: Secondary | ICD-10-CM | POA: Diagnosis not present

## 2016-02-20 DIAGNOSIS — I5032 Chronic diastolic (congestive) heart failure: Secondary | ICD-10-CM | POA: Diagnosis not present

## 2016-02-20 DIAGNOSIS — Z992 Dependence on renal dialysis: Secondary | ICD-10-CM | POA: Diagnosis not present

## 2016-02-20 DIAGNOSIS — Z9989 Dependence on other enabling machines and devices: Secondary | ICD-10-CM

## 2016-02-20 DIAGNOSIS — Z23 Encounter for immunization: Secondary | ICD-10-CM | POA: Diagnosis not present

## 2016-02-20 DIAGNOSIS — D631 Anemia in chronic kidney disease: Secondary | ICD-10-CM | POA: Diagnosis not present

## 2016-02-20 DIAGNOSIS — D509 Iron deficiency anemia, unspecified: Secondary | ICD-10-CM | POA: Diagnosis not present

## 2016-02-20 NOTE — Patient Instructions (Addendum)
Labs at HD.   Referral for auto titration.  Sleep Apnea  Sleep apnea is a sleep disorder characterized by abnormal pauses in breathing while you sleep. When your breathing pauses, the level of oxygen in your blood decreases. This causes you to move out of deep sleep and into light sleep. As a result, your quality of sleep is poor, and the system that carries your blood throughout your body (cardiovascular system) experiences stress. If sleep apnea remains untreated, the following conditions can develop:  High blood pressure (hypertension).  Coronary artery disease.  Inability to achieve or maintain an erection (impotence).  Impairment of your thought process (cognitive dysfunction). There are three types of sleep apnea: 1. Obstructive sleep apnea--Pauses in breathing during sleep because of a blocked airway. 2. Central sleep apnea--Pauses in breathing during sleep because the area of the brain that controls your breathing does not send the correct signals to the muscles that control breathing. 3. Mixed sleep apnea--A combination of both obstructive and central sleep apnea. RISK FACTORS The following risk factors can increase your risk of developing sleep apnea:  Being overweight.  Smoking.  Having narrow passages in your nose and throat.  Being of older age.  Being female.  Alcohol use.  Sedative and tranquilizer use.  Ethnicity. Among individuals younger than 35 years, African Americans are at increased risk of sleep apnea. SYMPTOMS   Difficulty staying asleep.  Daytime sleepiness and fatigue.  Loss of energy.  Irritability.  Loud, heavy snoring.  Morning headaches.  Trouble concentrating.  Forgetfulness.  Decreased interest in sex.  Unexplained sleepiness. DIAGNOSIS  In order to diagnose sleep apnea, your caregiver will perform a physical examination. A sleep study done in the comfort of your own home may be appropriate if you are otherwise healthy. Your  caregiver may also recommend that you spend the night in a sleep lab. In the sleep lab, several monitors record information about your heart, lungs, and brain while you sleep. Your leg and arm movements and blood oxygen level are also recorded. TREATMENT The following actions may help to resolve mild sleep apnea:  Sleeping on your side.   Using a decongestant if you have nasal congestion.   Avoiding the use of depressants, including alcohol, sedatives, and narcotics.   Losing weight and modifying your diet if you are overweight. There also are devices and treatments to help open your airway:  Oral appliances. These are custom-made mouthpieces that shift your lower jaw forward and slightly open your bite. This opens your airway.  Devices that create positive airway pressure. This positive pressure "splints" your airway open to help you breathe better during sleep. The following devices create positive airway pressure:  Continuous positive airway pressure (CPAP) device. The CPAP device creates a continuous level of air pressure with an air pump. The air is delivered to your airway through a mask while you sleep. This continuous pressure keeps your airway open.  Nasal expiratory positive airway pressure (EPAP) device. The EPAP device creates positive air pressure as you exhale. The device consists of single-use valves, which are inserted into each nostril and held in place by adhesive. The valves create very little resistance when you inhale but create much more resistance when you exhale. That increased resistance creates the positive airway pressure. This positive pressure while you exhale keeps your airway open, making it easier to breath when you inhale again.  Bilevel positive airway pressure (BPAP) device. The BPAP device is used mainly in patients with central sleep  apnea. This device is similar to the CPAP device because it also uses an air pump to deliver continuous air pressure through  a mask. However, with the BPAP machine, the pressure is set at two different levels. The pressure when you exhale is lower than the pressure when you inhale.  Surgery. Typically, surgery is only done if you cannot comply with less invasive treatments or if the less invasive treatments do not improve your condition. Surgery involves removing excess tissue in your airway to create a wider passage way.   This information is not intended to replace advice given to you by your health care provider. Make sure you discuss any questions you have with your health care provider.   Document Released: 05/16/2002 Document Revised: 06/16/2014 Document Reviewed: 10/02/2011 Elsevier Interactive Patient Education [employer].

## 2016-02-20 NOTE — Assessment & Plan Note (Signed)
Worsening. Patient is using more and more home oxygen. Referral placed to pulmonology for consult.

## 2016-02-20 NOTE — Assessment & Plan Note (Addendum)
Poorly controlled. Worsening. CPAP settings did not seem to be accurate for patient and seem to be exacerbating her COPD. Referral for auto titration at home. Reemphasized the patient importance of using her CPAP to address her chronic fatigue as well as to prevent cardiovascular disease sequela.

## 2016-02-20 NOTE — Progress Notes (Signed)
Pre visit review using our clinic review tool, if applicable. No additional management support is needed unless otherwise documented below in the visit note. 

## 2016-02-20 NOTE — Progress Notes (Signed)
Subjective:    Patient ID: Theresa [name], female    DOB: 04/13/1956, 60 y.o.   MRN: [id number]  CC: Theresa [name] is a 60 y.o. female who presents today for follow up.   HPI: Patient here for follow-up on chronic disease and for CPAP paperwork. She did not bring her home oxygen to the clinic today, and we have given her oxygen at 2 L.  OSA-Uncontrolled. C/o fatigue for several months despite 'good sleep'. going to send back today to return Cipap. Feels like she is suffocating and setting are too high. States worsening her COPD>   COPD-Worsening and using more O2 at home. Uses O2 at home as needed which was started during hospitalization for COPD 04/2015. Has albuterol inhaler. Doesn't see pulmonologist. No SOB, wheezing. Evaluated by pulmonologist a couple of years ago.  DM-Diet controlled. Doesn't check blood sugar. No recent A1C.  H/o HD.   HF- follows with cardiology. Known murmur. No LE swelling, SOB, cough, orthopnea.   Due for labs- gave prescription for labs to be done at HD.     HISTORY:  Past Medical History:  Diagnosis Date  . Anemia of chronic disease   . Carotid arterial disease ([place])    a. 02/2013 [facility]: 40-59% bilat ICA stenosis - *f/u 02/2014*  . Chronic constipation   . Chronic diastolic CHF (congestive heart failure) ([place])    a. 10/2013 Echo [facility]): EF 55-60%, mod conc LVH, mod MR, mildly dil LA, mild Ao sclerosis w/o stenosis.  . Colon polyps   . COPD (chronic obstructive pulmonary disease) ([place])   . Coronary artery disease    a. 05/2013 NSTEMI/PCI: LM 20d, LAD min irregs, LCX small, nl, OM1 nl, RCA dom 59m (2.5x16 Promus DES), PDA1 80p.  . Diabetes mellitus   . Diabetic neuropathy ([place])   . Diabetic retinopathy ([place]) 05/28/2013   Hx bilat retinal detachment, proliferative diab retinopathy and bilat vitreous hemorrhage   . Emphysema   . ESRD on hemodialysis ([place])    a. [facility] in [place], [place], on a TTS schedule.  She started dialysis in Feb 2014.  Etiology of  renal failure not known, likely diabetes.  Has a left upper arm AV graft.  . History of bronchitis    Mar 2012  . History of pneumonia    June 2012  . History of tobacco abuse    a. Quit 2012.  Marland Kitchen Hyperlipidemia   . Hypertension   . Moderate mitral insufficiency    a. 10/2013 Echo: EF 55-60%, mod MR.  . Peripheral vascular disease ([place])   . Sickle cell trait [facility])    Past Surgical History:  Procedure Laterality Date  . ABDOMINAL HYSTERECTOMY     2000  . CARDIAC CATHETERIZATION    . CARDIAC CATHETERIZATION N/A 05/02/2015   Procedure: Left Heart Cath and Coronary Angiography;  Surgeon: [name], MD;  Location: [place] CV LAB;  Service: Cardiovascular;  Laterality: N/A;  . colonscopy    . CORONARY ANGIOPLASTY  05/28/2014   stent placement to the mid RCA  . DILATION AND CURETTAGE OF UTERUS     several in the early 80's  . ESOPHAGOGASTRODUODENOSCOPY     2012  . EYE SURGERY     bilateral laser 2012  . EYE SURGERY     right  . GAS INSERTION  09/30/2011   Procedure: INSERTION OF GAS;  Surgeon: [name], MD;  Location: [place];  Service: Ophthalmology;  Laterality: Right;  [id number]  .  GAS/FLUID EXCHANGE  09/30/2011   Procedure: GAS/FLUID EXCHANGE;  Surgeon: [name], MD;  Location: [place];  Service: Ophthalmology;  Laterality: Right;  . LEFT HEART CATHETERIZATION WITH CORONARY ANGIOGRAM N/A 05/28/2013   Procedure: LEFT HEART CATHETERIZATION WITH CORONARY ANGIOGRAM;  Surgeon: [name], MD;  Location: [facility] CATH LAB;  Service: Cardiovascular;  Laterality: N/A;  . PARS PLANA VITRECTOMY  04/22/2011   Procedure: PARS PLANA VITRECTOMY WITH 25 GAUGE;  Surgeon: [name], MD;  Location: [place];  Service: Ophthalmology;  Laterality: Left;  membrane peel, endolaser, gas fluid exchange, silicone oil, repair of complex traction retinal detachment  . PARS PLANA VITRECTOMY  09/30/2011   Procedure: PARS PLANA VITRECTOMY WITH 25 GAUGE;  Surgeon: [name], MD;   Location: [place];  Service: Ophthalmology;  Laterality: Right;  Endolaser; Repair of Complex Traction Retinal Detachment  . PARS PLANA VITRECTOMY  02/24/2012   Procedure: PARS PLANA VITRECTOMY WITH 25 GAUGE;  Surgeon: [name], MD;  Location: [place];  Service: Ophthalmology;  Laterality: Left;  . PTCA    . SILICON OIL REMOVAL  2/94/7654   Procedure: SILICON OIL REMOVAL;  Surgeon: [name], MD;  Location: [place];  Service: Ophthalmology;  Laterality: Left;  . THROMBECTOMY / ARTERIOVENOUS GRAFT REVISION    . TUBAL LIGATION     1979   Family History  Problem Relation Age of Onset  . Stroke Mother   . Heart attack Mother   . Heart disease Mother   . Colon cancer Father   . Colon cancer Sister   . Heart attack Brother   . Stroke Brother   . Diabetes Brother   . Breast cancer Sister   . Diabetes Brother   . Diabetes Daughter   . Anesthesia problems Neg Hx   . Hypotension Neg Hx   . Malignant hyperthermia Neg Hx   . Pseudochol deficiency Neg Hx     Allergies: Hydrocodone and Metformin Current Outpatient Prescriptions on File Prior to Visit  Medication Sig Dispense Refill  . albuterol (PROVENTIL HFA;VENTOLIN HFA) 108 (90 BASE) MCG/ACT inhaler Inhale 2 puffs into the lungs every 6 (six) hours as needed for wheezing or shortness of breath.    Marland Kitchen albuterol (PROVENTIL) (2.5 MG/3ML) 0.083% nebulizer solution Take 2.5 mg by nebulization every 4 (four) hours as needed for wheezing or shortness of breath.    Marland Kitchen aspirin EC 81 MG tablet Take 81 mg by mouth daily.    . calcium acetate (PHOSLO) 667 MG capsule Take 2,668 mg by mouth 3 (three) times daily with meals.     . carvedilol (COREG) 12.5 MG tablet Take 1 tablet (12.5 mg total) by mouth 2 (two) times daily. If bp >160, take 2 tablets, otherwise, take 1 tablet twice daily. 70 tablet 6  . carvedilol (COREG) 12.5 MG tablet Take 1 to 2 tablets daily. If SBP>160, take 2 per day. 60 tablet 3  . cloNIDine (CATAPRES) 0.1 MG tablet Take 1  tablet (0.1 mg total) by mouth 2 (two) times daily. 60 tablet 11  . CRESTOR 40 MG tablet TAKE ONE TABLET BY MOUTH ONCE DAILY 90 tablet 3  . furosemide (LASIX) 80 MG tablet Take 80 mg by mouth daily.     . Melatonin 1 MG CAPS Take 1 capsule by mouth daily.    . multivitamin (RENA-VIT) TABS tablet Take 1 tablet by mouth daily.    . nitroGLYCERIN (NITROSTAT) 0.4 MG SL tablet Place 1 tablet (0.4 mg total)  under the tongue every 5 (five) minutes as needed for chest pain. 25 tablet 3  . pregabalin (LYRICA) 75 MG capsule Take 1 capsule (75 mg total) by mouth daily. 30 capsule 3   No current facility-administered medications on file prior to visit.     Social History  Substance Use Topics  . Smoking status: Former Smoker    Packs/day: 1.00    Years: 25.00    Quit date: 06/09/2010  . Smokeless tobacco: Never Used  . Alcohol use No    Review of Systems  Constitutional: Negative for chills and fever.  Respiratory: Negative for cough, shortness of breath and wheezing.   Cardiovascular: Negative for chest pain, palpitations and leg swelling.  Gastrointestinal: Negative for nausea and vomiting.  Neurological: Negative for dizziness.      Objective:    BP (!) 152/72   Pulse 71   Temp 98.3 F (36.8 C) (Oral)   Ht 5' 7.5" (1.715 m)   Wt 179 lb (81.2 kg)   SpO2 90%   BMI 27.62 kg/m  BP Readings from Last 3 Encounters:  02/20/16 (!) 152/72  02/05/16 (!) 142/64  01/08/16 (!) 138/52   Wt Readings from Last 3 Encounters:  02/20/16 179 lb (81.2 kg)  02/05/16 177 lb (80.3 kg)  01/08/16 177 lb (80.3 kg)    Physical Exam  Constitutional: She appears well-developed and well-nourished.  Eyes: Conjunctivae are normal.  Cardiovascular: Normal rate, regular rhythm and normal pulses.   Murmur heard.  Systolic murmur is present with a grade of 3/6  No LE edema, palpable cords or masses. No erythema or increased warmth. No asymmetry in calf size when compared bilaterally LE hair growth  symmetric and present. No discoloration of varicosities noted. LE warm and palpable pedal pulses. SEM III/VI, Loudest LSB, non radiating, no thrill   Pulmonary/Chest: Effort normal and breath sounds normal. She has no wheezes. She has no rhonchi. She has no rales.  Neurological: She is alert.  Skin: Skin is warm and dry.  Psychiatric: She has a normal mood and affect. Her speech is normal and behavior is normal. Thought content normal.  Vitals reviewed.      Assessment & Plan:   Problem List Items Addressed This Visit      Cardiovascular and Mediastinum   Diastolic heart failure (HCC)    No LE edema, adventitious lung sounds. Stable. Patient follows with cardiology. Continue current regimen.        Respiratory   COPD (chronic obstructive pulmonary disease) (HCC) (Chronic)    Worsening. Patient is using more and more home oxygen. Referral placed to pulmonology for consult.      Relevant Orders   Ambulatory referral to Pulmonology   OSA on CPAP - Primary (Chronic)    Poorly controlled. Worsening. CPAP settings did not seem to be accurate for patient and seem to be exacerbating her COPD. Referral for auto titration at home. Reemphasized the patient importance of using her CPAP to address her chronic fatigue as well as to prevent cardiovascular disease sequela.      Relevant Orders   Ambulatory referral to Sleep Studies    Other Visit Diagnoses   None.      I am having Ms. Shoaff maintain her aspirin EC, pregabalin, albuterol, cloNIDine, albuterol, calcium acetate, multivitamin, CRESTOR, furosemide, nitroGLYCERIN, Melatonin, carvedilol, and carvedilol.   No orders of the defined types were placed in this encounter.   Return precautions given.   Risks, benefits, and alternatives of the medications  and treatment plan prescribed today were discussed, and patient expressed understanding.   Education regarding symptom management and diagnosis given to patient on  AVS.  Continue to follow with Mable Paris, FNP for routine health maintenance.   Theresa [name] and I agreed with plan.   Mable Paris, FNP

## 2016-02-20 NOTE — Assessment & Plan Note (Signed)
No LE edema, adventitious lung sounds. Stable. Patient follows with cardiology. Continue current regimen.

## 2016-02-22 ENCOUNTER — Encounter: Payer: Self-pay | Admitting: Emergency Medicine

## 2016-02-22 ENCOUNTER — Observation Stay
Admission: EM | Admit: 2016-02-22 | Discharge: 2016-02-23 | Disposition: A | Discharge status: Home / Self Care | Payer: Medicare Other | Attending: Internal Medicine | Admitting: Internal Medicine

## 2016-02-22 ENCOUNTER — Emergency Department: Payer: Medicare Other

## 2016-02-22 DIAGNOSIS — Z823 Family history of stroke: Secondary | ICD-10-CM | POA: Insufficient documentation

## 2016-02-22 DIAGNOSIS — E785 Hyperlipidemia, unspecified: Secondary | ICD-10-CM | POA: Diagnosis not present

## 2016-02-22 DIAGNOSIS — Z8249 Family history of ischemic heart disease and other diseases of the circulatory system: Secondary | ICD-10-CM | POA: Insufficient documentation

## 2016-02-22 DIAGNOSIS — Z90710 Acquired absence of both cervix and uterus: Secondary | ICD-10-CM | POA: Insufficient documentation

## 2016-02-22 DIAGNOSIS — Z885 Allergy status to narcotic agent status: Secondary | ICD-10-CM | POA: Insufficient documentation

## 2016-02-22 DIAGNOSIS — I700 Atherosclerosis of aorta: Secondary | ICD-10-CM | POA: Diagnosis not present

## 2016-02-22 DIAGNOSIS — J441 Chronic obstructive pulmonary disease with (acute) exacerbation: Secondary | ICD-10-CM | POA: Diagnosis not present

## 2016-02-22 DIAGNOSIS — E11319 Type 2 diabetes mellitus with unspecified diabetic retinopathy without macular edema: Secondary | ICD-10-CM | POA: Diagnosis not present

## 2016-02-22 DIAGNOSIS — D631 Anemia in chronic kidney disease: Secondary | ICD-10-CM | POA: Diagnosis not present

## 2016-02-22 DIAGNOSIS — Z955 Presence of coronary angioplasty implant and graft: Secondary | ICD-10-CM | POA: Insufficient documentation

## 2016-02-22 DIAGNOSIS — I340 Nonrheumatic mitral (valve) insufficiency: Secondary | ICD-10-CM | POA: Insufficient documentation

## 2016-02-22 DIAGNOSIS — I2510 Atherosclerotic heart disease of native coronary artery without angina pectoris: Secondary | ICD-10-CM | POA: Insufficient documentation

## 2016-02-22 DIAGNOSIS — Z992 Dependence on renal dialysis: Secondary | ICD-10-CM | POA: Insufficient documentation

## 2016-02-22 DIAGNOSIS — I259 Chronic ischemic heart disease, unspecified: Secondary | ICD-10-CM | POA: Diagnosis not present

## 2016-02-22 DIAGNOSIS — J189 Pneumonia, unspecified organism: Secondary | ICD-10-CM | POA: Diagnosis not present

## 2016-02-22 DIAGNOSIS — I6523 Occlusion and stenosis of bilateral carotid arteries: Secondary | ICD-10-CM | POA: Diagnosis not present

## 2016-02-22 DIAGNOSIS — E1140 Type 2 diabetes mellitus with diabetic neuropathy, unspecified: Secondary | ICD-10-CM | POA: Insufficient documentation

## 2016-02-22 DIAGNOSIS — Z87891 Personal history of nicotine dependence: Secondary | ICD-10-CM | POA: Insufficient documentation

## 2016-02-22 DIAGNOSIS — K5909 Other constipation: Secondary | ICD-10-CM | POA: Insufficient documentation

## 2016-02-22 DIAGNOSIS — J440 Chronic obstructive pulmonary disease with acute lower respiratory infection: Secondary | ICD-10-CM | POA: Insufficient documentation

## 2016-02-22 DIAGNOSIS — Z9889 Other specified postprocedural states: Secondary | ICD-10-CM | POA: Insufficient documentation

## 2016-02-22 DIAGNOSIS — Z888 Allergy status to other drugs, medicaments and biological substances status: Secondary | ICD-10-CM | POA: Insufficient documentation

## 2016-02-22 DIAGNOSIS — E1122 Type 2 diabetes mellitus with diabetic chronic kidney disease: Secondary | ICD-10-CM | POA: Diagnosis not present

## 2016-02-22 DIAGNOSIS — D573 Sickle-cell trait: Secondary | ICD-10-CM | POA: Insufficient documentation

## 2016-02-22 DIAGNOSIS — Z23 Encounter for immunization: Secondary | ICD-10-CM | POA: Diagnosis not present

## 2016-02-22 DIAGNOSIS — Z7982 Long term (current) use of aspirin: Secondary | ICD-10-CM | POA: Insufficient documentation

## 2016-02-22 DIAGNOSIS — R079 Chest pain, unspecified: Secondary | ICD-10-CM

## 2016-02-22 DIAGNOSIS — Z86010 Personal history of colonic polyps: Secondary | ICD-10-CM | POA: Insufficient documentation

## 2016-02-22 DIAGNOSIS — E1151 Type 2 diabetes mellitus with diabetic peripheral angiopathy without gangrene: Secondary | ICD-10-CM | POA: Insufficient documentation

## 2016-02-22 DIAGNOSIS — R5383 Other fatigue: Secondary | ICD-10-CM | POA: Diagnosis not present

## 2016-02-22 DIAGNOSIS — N2581 Secondary hyperparathyroidism of renal origin: Secondary | ICD-10-CM | POA: Insufficient documentation

## 2016-02-22 DIAGNOSIS — I252 Old myocardial infarction: Secondary | ICD-10-CM | POA: Insufficient documentation

## 2016-02-22 DIAGNOSIS — Z8701 Personal history of pneumonia (recurrent): Secondary | ICD-10-CM | POA: Diagnosis not present

## 2016-02-22 DIAGNOSIS — K920 Hematemesis: Secondary | ICD-10-CM | POA: Insufficient documentation

## 2016-02-22 DIAGNOSIS — I132 Hypertensive heart and chronic kidney disease with heart failure and with stage 5 chronic kidney disease, or end stage renal disease: Secondary | ICD-10-CM | POA: Insufficient documentation

## 2016-02-22 DIAGNOSIS — D509 Iron deficiency anemia, unspecified: Secondary | ICD-10-CM | POA: Diagnosis not present

## 2016-02-22 DIAGNOSIS — Z803 Family history of malignant neoplasm of breast: Secondary | ICD-10-CM | POA: Insufficient documentation

## 2016-02-22 DIAGNOSIS — N186 End stage renal disease: Secondary | ICD-10-CM | POA: Insufficient documentation

## 2016-02-22 DIAGNOSIS — Z800 Family history of malignant neoplasm of digestive organs: Secondary | ICD-10-CM | POA: Insufficient documentation

## 2016-02-22 DIAGNOSIS — I5042 Chronic combined systolic (congestive) and diastolic (congestive) heart failure: Secondary | ICD-10-CM | POA: Insufficient documentation

## 2016-02-22 DIAGNOSIS — R0789 Other chest pain: Secondary | ICD-10-CM | POA: Diagnosis not present

## 2016-02-22 DIAGNOSIS — R0602 Shortness of breath: Secondary | ICD-10-CM | POA: Diagnosis not present

## 2016-02-22 DIAGNOSIS — I10 Essential (primary) hypertension: Secondary | ICD-10-CM | POA: Diagnosis not present

## 2016-02-22 DIAGNOSIS — Z833 Family history of diabetes mellitus: Secondary | ICD-10-CM | POA: Insufficient documentation

## 2016-02-22 DIAGNOSIS — E119 Type 2 diabetes mellitus without complications: Secondary | ICD-10-CM | POA: Diagnosis not present

## 2016-02-22 LAB — BASIC METABOLIC PANEL
Anion gap: 10 (ref 5–15)
BUN: 32 mg/dL — AB (ref 6–20)
CO2: 31 mmol/L (ref 22–32)
Calcium: 8.9 mg/dL (ref 8.9–10.3)
Chloride: 98 mmol/L — ABNORMAL LOW (ref 101–111)
Creatinine, Ser: 9.16 mg/dL — ABNORMAL HIGH (ref 0.44–1.00)
GFR calc Af Amer: 5 mL/min — ABNORMAL LOW (ref >60)
GFR, EST NON AFRICAN AMERICAN: 4 mL/min — AB (ref >60)
Glucose, Bld: 143 mg/dL — ABNORMAL HIGH (ref 65–99)
POTASSIUM: 4 mmol/L (ref 3.5–5.1)
Sodium: 139 mmol/L (ref 135–145)

## 2016-02-22 LAB — CBC
HEMATOCRIT: 29.9 % — AB (ref 35.0–47.0)
Hemoglobin: 10.2 g/dL — ABNORMAL LOW (ref 12.0–16.0)
MCH: 35.1 pg — ABNORMAL HIGH (ref 26.0–34.0)
MCHC: 34.1 g/dL (ref 32.0–36.0)
MCV: 102.9 fL — ABNORMAL HIGH (ref 80.0–100.0)
PLATELETS: 124 10*3/uL — AB (ref 150–440)
RBC: 2.91 MIL/uL — ABNORMAL LOW (ref 3.80–5.20)
RDW: 16 % — AB (ref 11.5–14.5)
WBC: 9 10*3/uL (ref 3.6–11.0)

## 2016-02-22 LAB — TROPONIN I: TROPONIN I: 0.03 ng/mL — AB (ref <0.03)

## 2016-02-22 MED ORDER — ASPIRIN EC 81 MG PO TBEC
DELAYED_RELEASE_TABLET | ORAL | Status: AC
  Administered 2016-02-22: 81 mg via ORAL
  Filled 2016-02-22: qty 1

## 2016-02-22 MED ORDER — ONDANSETRON HCL 4 MG PO TABS: 4.0000 mg | ORAL_TABLET | Freq: Four times a day (QID) | ORAL | Status: DC | PRN

## 2016-02-22 MED ORDER — LEVOFLOXACIN IN D5W 750 MG/150ML IV SOLN
750.0000 mg | Freq: Once | INTRAVENOUS | Status: AC
  Administered 2016-02-22: 750 mg via INTRAVENOUS
  Filled 2016-02-22: qty 150

## 2016-02-22 MED ORDER — SODIUM CHLORIDE 0.9% FLUSH
3.0000 mL | Freq: Two times a day (BID) | INTRAVENOUS | Status: DC
  Administered 2016-02-22: 3 mL via INTRAVENOUS

## 2016-02-22 MED ORDER — MELATONIN 5 MG PO TABS
2.5000 mg | ORAL_TABLET | Freq: Every day | ORAL | Status: DC
  Administered 2016-02-23: 2.5 mg via ORAL
  Filled 2016-02-22 (×2): qty 0.5

## 2016-02-22 MED ORDER — HYDRALAZINE HCL 20 MG/ML IJ SOLN: 10.0000 mg | Freq: Four times a day (QID) | INTRAMUSCULAR | Status: DC | PRN

## 2016-02-22 MED ORDER — ALBUTEROL SULFATE (2.5 MG/3ML) 0.083% IN NEBU: 2.5000 mg | INHALATION_SOLUTION | RESPIRATORY_TRACT | Status: DC | PRN

## 2016-02-22 MED ORDER — CARVEDILOL 12.5 MG PO TABS
12.5000 mg | ORAL_TABLET | Freq: Two times a day (BID) | ORAL | Status: DC
  Administered 2016-02-23: 12.5 mg via ORAL
  Filled 2016-02-22: qty 1

## 2016-02-22 MED ORDER — HYDRALAZINE HCL 20 MG/ML IJ SOLN
10.0000 mg | Freq: Four times a day (QID) | INTRAMUSCULAR | Status: DC | PRN
  Administered 2016-02-22: 10 mg via INTRAVENOUS

## 2016-02-22 MED ORDER — ASPIRIN EC 81 MG PO TBEC
81.0000 mg | DELAYED_RELEASE_TABLET | Freq: Every day | ORAL | Status: DC
  Administered 2016-02-22: 81 mg via ORAL
  Filled 2016-02-22: qty 1

## 2016-02-22 MED ORDER — ROSUVASTATIN CALCIUM 20 MG PO TABS
40.0000 mg | ORAL_TABLET | Freq: Every day | ORAL | Status: DC
  Filled 2016-02-22: qty 2

## 2016-02-22 MED ORDER — LEVOFLOXACIN 500 MG PO TABS: 500.0000 mg | ORAL_TABLET | Freq: Every day | ORAL | Status: DC

## 2016-02-22 MED ORDER — INSULIN ASPART 100 UNIT/ML ~~LOC~~ SOLN: 0.0000 [IU] | Freq: Three times a day (TID) | SUBCUTANEOUS | Status: DC

## 2016-02-22 MED ORDER — CLONIDINE HCL 0.1 MG PO TABS
0.1000 mg | ORAL_TABLET | Freq: Two times a day (BID) | ORAL | Status: DC
  Administered 2016-02-23 (×2): 0.1 mg via ORAL
  Filled 2016-02-22 (×2): qty 1

## 2016-02-22 MED ORDER — SODIUM CHLORIDE 0.9% FLUSH: 3.0000 mL | Freq: Two times a day (BID) | INTRAVENOUS | Status: DC

## 2016-02-22 MED ORDER — CALCIUM ACETATE (PHOS BINDER) 667 MG PO CAPS
2668.0000 mg | ORAL_CAPSULE | Freq: Three times a day (TID) | ORAL | Status: DC
  Filled 2016-02-22: qty 4

## 2016-02-22 MED ORDER — ALBUTEROL SULFATE (2.5 MG/3ML) 0.083% IN NEBU
2.5000 mg | INHALATION_SOLUTION | Freq: Four times a day (QID) | RESPIRATORY_TRACT | Status: DC
  Administered 2016-02-23 (×2): 2.5 mg via RESPIRATORY_TRACT
  Filled 2016-02-22 (×3): qty 3

## 2016-02-22 MED ORDER — LEVOFLOXACIN IN D5W 500 MG/100ML IV SOLN: 500.0000 mg | INTRAVENOUS | Status: DC

## 2016-02-22 MED ORDER — SODIUM CHLORIDE 0.9 % IV SOLN: 250.0000 mL | INTRAVENOUS | Status: DC | PRN

## 2016-02-22 MED ORDER — NITROGLYCERIN 0.4 MG SL SUBL: 0.4000 mg | SUBLINGUAL_TABLET | SUBLINGUAL | Status: DC | PRN

## 2016-02-22 MED ORDER — IPRATROPIUM-ALBUTEROL 0.5-2.5 (3) MG/3ML IN SOLN
3.0000 mL | Freq: Once | RESPIRATORY_TRACT | Status: AC
  Administered 2016-02-22: 3 mL via RESPIRATORY_TRACT
  Filled 2016-02-22: qty 3

## 2016-02-22 MED ORDER — METHYLPREDNISOLONE SODIUM SUCC 125 MG IJ SOLR
60.0000 mg | Freq: Two times a day (BID) | INTRAMUSCULAR | Status: DC
  Administered 2016-02-23: 125 mg via INTRAVENOUS
  Filled 2016-02-22: qty 2

## 2016-02-22 MED ORDER — FUROSEMIDE 40 MG PO TABS
80.0000 mg | ORAL_TABLET | Freq: Every day | ORAL | Status: DC
  Administered 2016-02-22 – 2016-02-23 (×2): 80 mg via ORAL
  Filled 2016-02-22: qty 2

## 2016-02-22 MED ORDER — SODIUM CHLORIDE 0.9% FLUSH: 3.0000 mL | INTRAVENOUS | Status: DC | PRN

## 2016-02-22 MED ORDER — HYDRALAZINE HCL 20 MG/ML IJ SOLN
INTRAMUSCULAR | Status: AC
  Administered 2016-02-22: 10 mg via INTRAVENOUS
  Filled 2016-02-22: qty 1

## 2016-02-22 MED ORDER — ASPIRIN 81 MG PO CHEW
CHEWABLE_TABLET | ORAL | Status: DC
Start: 2016-02-22 — End: 2016-02-22
  Filled 2016-02-22: qty 1

## 2016-02-22 MED ORDER — ONDANSETRON HCL 4 MG/2ML IJ SOLN: 4.0000 mg | Freq: Four times a day (QID) | INTRAMUSCULAR | Status: DC | PRN

## 2016-02-22 MED ORDER — ACETAMINOPHEN 325 MG PO TABS
650.0000 mg | ORAL_TABLET | Freq: Four times a day (QID) | ORAL | Status: DC | PRN
  Administered 2016-02-22 – 2016-02-23 (×2): 650 mg via ORAL
  Filled 2016-02-22 (×2): qty 2

## 2016-02-22 MED ORDER — HEPARIN SODIUM (PORCINE) 5000 UNIT/ML IJ SOLN
5000.0000 [IU] | Freq: Three times a day (TID) | INTRAMUSCULAR | Status: DC
  Administered 2016-02-23 (×2): 5000 [IU] via SUBCUTANEOUS
  Filled 2016-02-22 (×2): qty 1

## 2016-02-22 MED ORDER — ACETAMINOPHEN 650 MG RE SUPP: 650.0000 mg | Freq: Four times a day (QID) | RECTAL | Status: DC | PRN

## 2016-02-22 MED ORDER — ALBUTEROL SULFATE HFA 108 (90 BASE) MCG/ACT IN AERS: 2.0000 | INHALATION_SPRAY | Freq: Four times a day (QID) | RESPIRATORY_TRACT | Status: DC | PRN

## 2016-02-22 MED ORDER — PREGABALIN 75 MG PO CAPS
75.0000 mg | ORAL_CAPSULE | Freq: Every day | ORAL | Status: DC
  Administered 2016-02-22: 75 mg via ORAL
  Filled 2016-02-22: qty 1

## 2016-02-22 MED ORDER — FUROSEMIDE 40 MG PO TABS
ORAL_TABLET | ORAL | Status: AC
  Administered 2016-02-22: 80 mg via ORAL
  Filled 2016-02-22: qty 2

## 2016-02-22 MED ORDER — RENA-VITE PO TABS
1.0000 | ORAL_TABLET | Freq: Every day | ORAL | Status: DC
  Administered 2016-02-23: 1 via ORAL
  Filled 2016-02-22: qty 1

## 2016-02-22 MED ORDER — PREGABALIN 75 MG PO CAPS
ORAL_CAPSULE | ORAL | Status: AC
  Administered 2016-02-22: 75 mg via ORAL
  Filled 2016-02-22: qty 1

## 2016-02-22 NOTE — ED Provider Notes (Signed)
[facility] Emergency Department Provider Note    ____________________________________________   I have reviewed the triage vital signs and the nursing notes.   HISTORY  Chief Complaint Chest tightness  History limited by: Not Limited   HPI Theresa [name] is a 60 y.o. female with history of end stage renal disease who presents to the emergency department today because of concern for chest tightness. The patient states that she woke up this morning with that sensation. It has persisted since then. The tightness is located in the center of her chest. It is associated with shortness of breath. The patient has not appreciated any fevers. States she has felt this way in the past and was told that she had a COPD exacerbation. The patient denies any fevers.   Past Medical History:  Diagnosis Date  . Anemia of chronic disease   . Carotid arterial disease ([place])    a. 02/2013 U/S: 40-59% bilat ICA stenosis - *f/u 02/2014*  . Chronic constipation   . Chronic diastolic CHF (congestive heart failure) ([place])    a. 10/2013 Echo [facility]): EF 55-60%, mod conc LVH, mod MR, mildly dil LA, mild Ao sclerosis w/o stenosis.  . Colon polyps   . COPD (chronic obstructive pulmonary disease) ([place])   . Coronary artery disease    a. 05/2013 NSTEMI/PCI: LM 20d, LAD min irregs, LCX small, nl, OM1 nl, RCA dom 91m (2.5x16 Promus DES), PDA1 80p.  . Diabetes mellitus   . Diabetic neuropathy ([place])   . Diabetic retinopathy ([place]) 05/28/2013   Hx bilat retinal detachment, proliferative diab retinopathy and bilat vitreous hemorrhage   . Emphysema   . ESRD on hemodialysis ([place])    a. [facility] in [place], [place], on a TTS schedule.  She started dialysis in Feb 2014.  Etiology of renal failure not known, likely diabetes.  Has a left upper arm AV graft.  . History of bronchitis    Mar 2012  . History of pneumonia    June 2012  . History of tobacco abuse    a. Quit 2012.  Marland Kitchen Hyperlipidemia   .  Hypertension   . Moderate mitral insufficiency    a. 10/2013 Echo: EF 55-60%, mod MR.  . Peripheral vascular disease ([place])   . Sickle cell trait [facility])     Patient Active Problem List   Diagnosis Date Noted  . Mitral regurgitation 02/06/2016  . OSA on CPAP 12/20/2015  . Screening for breast cancer 07/27/2015  . Diastolic heart failure ([place]) 05/07/2015  . S/P coronary artery stent placement   . Neuritis or radiculitis due to rupture of lumbar intervertebral disc 11/28/2014  . Right hip pain 06/13/2014  . Routine general medical examination at a health care facility 09/02/2013  . Osteoarthritis of right hip 08/23/2013  . DDD (degenerative disc disease), lumbosacral 08/23/2013  . Coronary artery disease 06/27/2013  . Diabetic retinopathy ([place]) 05/28/2013  . Hyperlipidemia   . Essential (primary) hypertension 11/08/2012  . Diabetic neuropathy ([place]) 02/18/2012  . Anemia in chronic kidney disease (CKD) 02/18/2012  . ESRD on hemodialysis ([place]) 08/21/2011  . Diabetes mellitus, type II ([place]) 06/30/2011  . COPD (chronic obstructive pulmonary disease) ([place]) 06/13/2011    Past Surgical History:  Procedure Laterality Date  . ABDOMINAL HYSTERECTOMY     2000  . CARDIAC CATHETERIZATION    . CARDIAC CATHETERIZATION N/A 05/02/2015   Procedure: Left Heart Cath and Coronary Angiography;  Surgeon: [name], MD;  Location: [place] [facility];  Service:  Cardiovascular;  Laterality: N/A;  . colonscopy    . CORONARY ANGIOPLASTY  05/28/2014   stent placement to the mid RCA  . DILATION AND CURETTAGE OF UTERUS     several in the early 80's  . ESOPHAGOGASTRODUODENOSCOPY     2012  . EYE SURGERY     bilateral laser 2012  . EYE SURGERY     right  . GAS INSERTION  09/30/2011   Procedure: INSERTION OF GAS;  Surgeon: [name], MD;  Location: [place];  Service: Ophthalmology;  Laterality: Right;  [id number]  . GAS/FLUID EXCHANGE  09/30/2011   Procedure: GAS/FLUID EXCHANGE;  Surgeon: [name], MD;  Location: [place];  Service: Ophthalmology;  Laterality: Right;  . LEFT HEART CATHETERIZATION WITH CORONARY ANGIOGRAM N/A 05/28/2013   Procedure: LEFT HEART CATHETERIZATION WITH CORONARY ANGIOGRAM;  Surgeon: [name], MD;  Location: [facility] CATH [facility];  Service: Cardiovascular;  Laterality: N/A;  . PARS PLANA VITRECTOMY  04/22/2011   Procedure: PARS PLANA VITRECTOMY WITH 25 GAUGE;  Surgeon: [name], MD;  Location: [place];  Service: Ophthalmology;  Laterality: Left;  membrane peel, endolaser, gas fluid exchange, silicone oil, repair of complex traction retinal detachment  . PARS PLANA VITRECTOMY  09/30/2011   Procedure: PARS PLANA VITRECTOMY WITH 25 GAUGE;  Surgeon: [name], MD;  Location: [place];  Service: Ophthalmology;  Laterality: Right;  Endolaser; Repair of Complex Traction Retinal Detachment  . PARS PLANA VITRECTOMY  02/24/2012   Procedure: PARS PLANA VITRECTOMY WITH 25 GAUGE;  Surgeon: [name], MD;  Location: [place];  Service: Ophthalmology;  Laterality: Left;  . PTCA    . SILICON OIL REMOVAL  0/63/0160   Procedure: SILICON OIL REMOVAL;  Surgeon: [name], MD;  Location: [place];  Service: Ophthalmology;  Laterality: Left;  . THROMBECTOMY / ARTERIOVENOUS GRAFT REVISION    . TUBAL LIGATION     1979    Prior to Admission medications   Medication Sig Start Date End Date Taking? Authorizing Provider  albuterol (PROVENTIL HFA;VENTOLIN HFA) 108 (90 BASE) MCG/ACT inhaler Inhale 2 puffs into the lungs every 6 (six) hours as needed for wheezing or shortness of breath.    Historical Provider, MD  albuterol (PROVENTIL) (2.5 MG/3ML) 0.083% nebulizer solution Take 2.5 mg by nebulization every 4 (four) hours as needed for wheezing or shortness of breath.    Historical Provider, MD  aspirin EC 81 MG tablet Take 81 mg by mouth daily.    Historical Provider, MD  calcium acetate (PHOSLO) 667 MG capsule Take 2,668 mg by mouth 3 (three) times daily with meals.      Historical Provider, MD  carvedilol (COREG) 12.5 MG tablet Take 1 tablet (12.5 mg total) by mouth 2 (two) times daily. If bp >160, take 2 tablets, otherwise, take 1 tablet twice daily. 02/09/16   Alisa Graff, FNP  carvedilol (COREG) 12.5 MG tablet Take 1 to 2 tablets daily. If SBP>160, take 2 per day. 02/05/16   Rise Mu, PA-C  cloNIDine (CATAPRES) 0.1 MG tablet Take 1 tablet (0.1 mg total) by mouth 2 (two) times daily. 01/30/15   Minna Merritts, MD  CRESTOR 40 MG tablet TAKE ONE TABLET BY MOUTH ONCE DAILY 08/09/15   Minna Merritts, MD  furosemide (LASIX) 80 MG tablet Take 80 mg by mouth daily.     Historical Provider, MD  Melatonin 1 MG CAPS Take 1 capsule by mouth daily.    Historical Provider,  MD  multivitamin (RENA-VIT) TABS tablet Take 1 tablet by mouth daily.    Historical Provider, MD  nitroGLYCERIN (NITROSTAT) 0.4 MG SL tablet Place 1 tablet (0.4 mg total) under the tongue every 5 (five) minutes as needed for chest pain. 09/26/15   Alisa Graff, FNP  pregabalin (LYRICA) 75 MG capsule Take 1 capsule (75 mg total) by mouth daily. 03/28/14   Jackolyn Confer, MD    Allergies Hydrocodone and Metformin  Family History  Problem Relation Age of Onset  . Stroke Mother   . Heart attack Mother   . Heart disease Mother   . Colon cancer Father   . Colon cancer Sister   . Heart attack Brother   . Stroke Brother   . Diabetes Brother   . Breast cancer Sister   . Diabetes Brother   . Diabetes Daughter   . Anesthesia problems Neg Hx   . Hypotension Neg Hx   . Malignant hyperthermia Neg Hx   . Pseudochol deficiency Neg Hx     Social History Social History  Substance Use Topics  . Smoking status: Former Smoker    Packs/day: 1.00    Years: 25.00    Quit date: 06/09/2010  . Smokeless tobacco: Never Used  . Alcohol use No    Review of Systems  Constitutional: Negative for fever. Cardiovascular: Positive for chest tightness.  Respiratory: Positive for shortness of  breath. Gastrointestinal: Negative for abdominal pain, vomiting and diarrhea. Neurological: Negative for headaches, focal weakness or numbness.  10-point ROS otherwise negative.  ____________________________________________   PHYSICAL EXAM:  VITAL SIGNS: ED Triage Vitals  Enc Vitals Group     BP 02/22/16 1550 (!) 224/75     Pulse Rate 02/22/16 1550 73     Resp 02/22/16 1550 19     Temp 02/22/16 1550 98.5 F (36.9 C)     Temp Source 02/22/16 1550 Oral     SpO2 02/22/16 1550 100 %     Weight 02/22/16 1551 179 lb (81.2 kg)     Height 02/22/16 1551 5' 7.5" (1.715 m)     Head Circumference --      Peak Flow --      Pain Score 02/22/16 1551 7   Constitutional: Alert and oriented. Well appearing and in no distress. Eyes: Conjunctivae are normal. Normal extraocular movements. ENT   Head: Normocephalic and atraumatic.   Nose: No congestion/rhinnorhea.   Mouth/Throat: Mucous membranes are moist.   Neck: No stridor. Hematological/Lymphatic/Immunilogical: No cervical lymphadenopathy. Cardiovascular: Normal rate, regular rhythm.  Systolic murmur noted.  Respiratory: Normal respiratory effort without tachypnea nor retractions. Breath sounds are clear and equal bilaterally. No wheezes/rales/rhonchi. Gastrointestinal: Soft and nontender. No distention.  Genitourinary: Deferred Musculoskeletal: Normal range of motion in all extremities. No lower extremity edema. Neurologic:  Normal speech and language. No gross focal neurologic deficits are appreciated.  Skin:  Skin is warm, dry and intact. No rash noted. Psychiatric: Mood and affect are normal. Speech and behavior are normal. Patient exhibits appropriate insight and judgment.  ____________________________________________    LABS (pertinent positives/negatives)  Labs Reviewed  BASIC METABOLIC PANEL - Abnormal; Notable for the following:       Result Value   Chloride 98 (*)    Glucose, Bld 143 (*)    BUN 32 (*)     Creatinine, Ser 9.16 (*)    GFR calc non Af Amer 4 (*)    GFR calc Af Amer 5 (*)    All other components  within normal limits  CBC - Abnormal; Notable for the following:    RBC 2.91 (*)    Hemoglobin 10.2 (*)    HCT 29.9 (*)    MCV 102.9 (*)    MCH 35.1 (*)    RDW 16.0 (*)    Platelets 124 (*)    All other components within normal limits  TROPONIN I - Abnormal; Notable for the following:    Troponin I 0.03 (*)    All other components within normal limits  CULTURE, BLOOD (ROUTINE X 2)  CULTURE, BLOOD (ROUTINE X 2)     ____________________________________________   EKG  I, Nance Pear, attending physician, personally viewed and interpreted this EKG  EKG Time: 1547 Rate: 74 Rhythm: normal sinus rhythm Axis: normal Intervals: qtc 466 QRS: narrow, LVH ST changes: no st elevation Impression: abnormal ekg   ____________________________________________    RADIOLOGY  CXR IMPRESSION:  Bibasilar airspace disease is worse on the right and could be due to  atelectasis or infection.    Very small bilateral pleural effusions.    Cardiomegaly.    Atherosclerosis.      I, Ladarrius Bogdanski, personally viewed and evaluated these images (plain radiographs) as part of my medical decision making. ____________________________________________   PROCEDURES  Procedures  ____________________________________________   INITIAL IMPRESSION / ASSESSMENT AND PLAN / ED COURSE  Pertinent labs & imaging results that were available during my care of the patient were reviewed by me and considered in my medical decision making (see chart for details).  Patient presented to the emergency department today because of concerns for chest tightness and shortness of breath. She was sent from the dialysis center. Exam patient appears in no acute distress. Patient does have a history of coronary artery disease. Initial troponin 0.03 which is abnormal. Additionally chest x-ray was  concerning for possible pneumonia. Will give patient antibiotics and admit for further management and work up.  ____________________________________________   FINAL CLINICAL IMPRESSION(S) / ED DIAGNOSES  Final diagnoses:  Chest pain, unspecified chest pain type     Note: This dictation was prepared with Dragon dictation. Any transcriptional errors that result from this process are unintentional    Nance Pear, MD 02/22/16 1851

## 2016-02-22 NOTE — ED Notes (Signed)
Pt transported to room 231A

## 2016-02-22 NOTE — H&P (Addendum)
[facility] NAME: Theresa [name]    MR#:  [id number]  DATE OF BIRTH:  11/29/1955  DATE OF ADMISSION:  02/22/2016  PRIMARY CARE PHYSICIAN: Mable Paris, FNP   REQUESTING/REFERRING PHYSICIAN: Danie Binder MD  CHIEF COMPLAINT:   Chief Complaint  Patient presents with  . Chest Pain  . Vascular Access Problem    HISTORY OF PRESENT ILLNESS: Theresa [name]  is a 60 y.o. female with a known history of  End-stage renal disease, COPD, coronary artery disease, chronic systolic CHF presents to the ED with complaining of having chest pressure since this morning. She reports that she was in dialysis and only was able to 30 minutes of dialysis. She reports that usually when she has the symptoms that she was E due to COPD. She reports that she's been coughing and wheezing. And having more shortness of breath. Denies any palpitations no fevers or chills has a nonproductive cough. PAST MEDICAL HISTORY:   Past Medical History:  Diagnosis Date  . Anemia of chronic disease   . Carotid arterial disease ([place])    a. 02/2013 U/S: 40-59% bilat ICA stenosis - *f/u 02/2014*  . Chronic constipation   . Chronic diastolic CHF (congestive heart failure) ([place])    a. 10/2013 Echo [facility]): EF 55-60%, mod conc LVH, mod MR, mildly dil LA, mild Ao sclerosis w/o stenosis.  . Colon polyps   . COPD (chronic obstructive pulmonary disease) ([place])   . Coronary artery disease    a. 05/2013 NSTEMI/PCI: LM 20d, LAD min irregs, LCX small, nl, OM1 nl, RCA dom 24m (2.5x16 Promus DES), PDA1 80p.  . Diabetes mellitus   . Diabetic neuropathy ([place])   . Diabetic retinopathy ([place]) 05/28/2013   Hx bilat retinal detachment, proliferative diab retinopathy and bilat vitreous hemorrhage   . Emphysema   . ESRD on hemodialysis ([place])    a. [facility] in [place], [place], on a TTS schedule.  She started dialysis in Feb 2014.  Etiology of renal failure not known, likely diabetes.  Has a left  upper arm AV graft.  . History of bronchitis    Mar 2012  . History of pneumonia    June 2012  . History of tobacco abuse    a. Quit 2012.  Marland Kitchen Hyperlipidemia   . Hypertension   . Moderate mitral insufficiency    a. 10/2013 Echo: EF 55-60%, mod MR.  . Peripheral vascular disease ([place])   . Sickle cell trait ([place])     PAST SURGICAL HISTORY: Past Surgical History:  Procedure Laterality Date  . ABDOMINAL HYSTERECTOMY     2000  . CARDIAC CATHETERIZATION    . CARDIAC CATHETERIZATION N/A 05/02/2015   Procedure: Left Heart Cath and Coronary Angiography;  Surgeon: [name], MD;  Location: [facility] CV [facility];  Service: Cardiovascular;  Laterality: N/A;  . colonscopy    . CORONARY ANGIOPLASTY  05/28/2014   stent placement to the mid RCA  . DILATION AND CURETTAGE OF UTERUS     several in the early 80's  . ESOPHAGOGASTRODUODENOSCOPY     2012  . EYE SURGERY     bilateral laser 2012  . EYE SURGERY     right  . GAS INSERTION  09/30/2011   Procedure: INSERTION OF GAS;  Surgeon: [name], MD;  Location: [place];  Service: Ophthalmology;  Laterality: Right;  C3F8  . GAS/FLUID EXCHANGE  09/30/2011   Procedure: GAS/FLUID EXCHANGE;  Surgeon: [name]  Zigmund Daniel, MD;  Location: [place];  Service: Ophthalmology;  Laterality: Right;  . LEFT HEART CATHETERIZATION WITH CORONARY ANGIOGRAM N/A 05/28/2013   Procedure: LEFT HEART CATHETERIZATION WITH CORONARY ANGIOGRAM;  Surgeon: [name], MD;  Location: [facility] CATH LAB;  Service: Cardiovascular;  Laterality: N/A;  . PARS PLANA VITRECTOMY  04/22/2011   Procedure: PARS PLANA VITRECTOMY WITH 25 GAUGE;  Surgeon: [name], MD;  Location: [place];  Service: Ophthalmology;  Laterality: Left;  membrane peel, endolaser, gas fluid exchange, silicone oil, repair of complex traction retinal detachment  . PARS PLANA VITRECTOMY  09/30/2011   Procedure: PARS PLANA VITRECTOMY WITH 25 GAUGE;  Surgeon: [name], MD;  Location: [place];  Service:  Ophthalmology;  Laterality: Right;  Endolaser; Repair of Complex Traction Retinal Detachment  . PARS PLANA VITRECTOMY  02/24/2012   Procedure: PARS PLANA VITRECTOMY WITH 25 GAUGE;  Surgeon: [name], MD;  Location: [place];  Service: Ophthalmology;  Laterality: Left;  . PTCA    . SILICON OIL REMOVAL  4/69/6295   Procedure: SILICON OIL REMOVAL;  Surgeon: [name], MD;  Location: [place];  Service: Ophthalmology;  Laterality: Left;  . THROMBECTOMY / ARTERIOVENOUS GRAFT REVISION    . TUBAL LIGATION     1979    SOCIAL HISTORY:  Social History  Substance Use Topics  . Smoking status: Former Smoker    Packs/day: 1.00    Years: 25.00    Quit date: 06/09/2010  . Smokeless tobacco: Never Used  . Alcohol use No    FAMILY HISTORY:  Family History  Problem Relation Age of Onset  . Stroke Mother   . Heart attack Mother   . Heart disease Mother   . Colon cancer Father   . Colon cancer Sister   . Heart attack Brother   . Stroke Brother   . Diabetes Brother   . Breast cancer Sister   . Diabetes Brother   . Diabetes Daughter   . Anesthesia problems Neg Hx   . Hypotension Neg Hx   . Malignant hyperthermia Neg Hx   . Pseudochol deficiency Neg Hx     DRUG ALLERGIES:  Allergies  Allergen Reactions  . Hydrocodone Hives  . Metformin Diarrhea    REVIEW OF SYSTEMS:   CONSTITUTIONAL: No fever, fatigue or weakness.  EYES: No blurred or double vision.  EARS, NOSE, AND THROAT: No tinnitus or ear pain.  RESPIRATORY: Positive cough, positive shortness of breath, positive wheezing or no hemoptysis.  CARDIOVASCULAR: Positive chest pressure, no orthopnea, edema.  GASTROINTESTINAL: No nausea, vomiting, diarrhea or abdominal pain.  GENITOURINARY: No dysuria, hematuria.  ENDOCRINE: No polyuria, nocturia,  HEMATOLOGY: No anemia, easy bruising or bleeding SKIN: No rash or lesion. MUSCULOSKELETAL: No joint pain or arthritis.   NEUROLOGIC: No tingling, numbness, weakness.  PSYCHIATRY: No  anxiety or depression.   MEDICATIONS AT HOME:  Prior to Admission medications   Medication Sig Start Date End Date Taking? Authorizing Provider  albuterol (PROVENTIL HFA;VENTOLIN HFA) 108 (90 BASE) MCG/ACT inhaler Inhale 2 puffs into the lungs every 6 (six) hours as needed for wheezing or shortness of breath.    Historical Provider, MD  albuterol (PROVENTIL) (2.5 MG/3ML) 0.083% nebulizer solution Take 2.5 mg by nebulization every 4 (four) hours as needed for wheezing or shortness of breath.    Historical Provider, MD  aspirin EC 81 MG tablet Take 81 mg by mouth daily.    Historical Provider, MD  calcium acetate (PHOSLO) 667 MG capsule  Take 2,668 mg by mouth 3 (three) times daily with meals.     Historical Provider, MD  carvedilol (COREG) 12.5 MG tablet Take 1 tablet (12.5 mg total) by mouth 2 (two) times daily. If bp >160, take 2 tablets, otherwise, take 1 tablet twice daily. 02/09/16   Alisa Graff, FNP  carvedilol (COREG) 12.5 MG tablet Take 1 to 2 tablets daily. If SBP>160, take 2 per day. 02/05/16   Rise Mu, PA-C  cloNIDine (CATAPRES) 0.1 MG tablet Take 1 tablet (0.1 mg total) by mouth 2 (two) times daily. 01/30/15   Minna Merritts, MD  CRESTOR 40 MG tablet TAKE ONE TABLET BY MOUTH ONCE DAILY 08/09/15   Minna Merritts, MD  furosemide (LASIX) 80 MG tablet Take 80 mg by mouth daily.     Historical Provider, MD  Melatonin 1 MG CAPS Take 1 capsule by mouth daily.    Historical Provider, MD  multivitamin (RENA-VIT) TABS tablet Take 1 tablet by mouth daily.    Historical Provider, MD  nitroGLYCERIN (NITROSTAT) 0.4 MG SL tablet Place 1 tablet (0.4 mg total) under the tongue every 5 (five) minutes as needed for chest pain. 09/26/15   Alisa Graff, FNP  pregabalin (LYRICA) 75 MG capsule Take 1 capsule (75 mg total) by mouth daily. 03/28/14   Jackolyn Confer, MD      PHYSICAL EXAMINATION:   VITAL SIGNS: Blood pressure (!) 214/74, pulse 70, temperature 98.5 F (36.9 C), temperature source  Oral, resp. rate 18, height 5' 7.5" (1.715 m), weight 179 lb (81.2 kg), SpO2 97 %.  GENERAL:  60 y.o.-year-old patient lying in the bed with no acute distress.  EYES: Pupils equal, round, reactive to light and accommodation. No scleral icterus. Extraocular muscles intact.  HEENT: Head atraumatic, normocephalic. Oropharynx and nasopharynx clear.  NECK:  Supple, no jugular venous distention. No thyroid enlargement, no tenderness.  LUNGS: Has occasional wheezing no rhonchi or crackles No use of accessory muscles of respiration.  CARDIOVASCULAR: S1, S2 normal. No murmurs, rubs, or gallops.  ABDOMEN: Soft, nontender, nondistended. Bowel sounds present. No organomegaly or mass.  EXTREMITIES: No pedal edema, cyanosis, or clubbing.  NEUROLOGIC: Cranial nerves II through XII are intact. Muscle strength 5/5 in all extremities. Sensation intact. Gait not checked.  PSYCHIATRIC: The patient is alert and oriented x 3.  SKIN: No obvious rash, lesion, or ulcer.   LABORATORY PANEL:   CBC  Recent Labs Lab 02/22/16 1641  WBC 9.0  HGB 10.2*  HCT 29.9*  PLT 124*  MCV 102.9*  MCH 35.1*  MCHC 34.1  RDW 16.0*   ------------------------------------------------------------------------------------------------------------------  Chemistries   Recent Labs Lab 02/22/16 1641  NA 139  K 4.0  CL 98*  CO2 31  GLUCOSE 143*  BUN 32*  CREATININE 9.16*  CALCIUM 8.9   ------------------------------------------------------------------------------------------------------------------ estimated creatinine clearance is 7.4 mL/min (by C-G formula based on SCr of 9.16 mg/dL (H)). ------------------------------------------------------------------------------------------------------------------ No results for input(s): TSH, T4TOTAL, T3FREE, THYROIDAB in the last 72 hours.  Invalid input(s): FREET3   Coagulation profile No results for input(s): INR, PROTIME in the last 168  hours. ------------------------------------------------------------------------------------------------------------------- No results for input(s): DDIMER in the last 72 hours. -------------------------------------------------------------------------------------------------------------------  Cardiac Enzymes  Recent Labs Lab 02/22/16 1641  TROPONINI 0.03*   ------------------------------------------------------------------------------------------------------------------ Invalid input(s): POCBNP  ---------------------------------------------------------------------------------------------------------------  Urinalysis    Component Value Date/Time   COLORURINE Yellow 06/26/2012 2030   APPEARANCEUR Hazy 06/26/2012 2030   LABSPEC 1.010 06/26/2012 2030   PHURINE 8.0 06/26/2012 2030  GLUCOSEU 50 mg/dL 06/26/2012 2030   [place] Negative 06/26/2012 2030   [place] Negative 06/26/2012 2030   KETONESUR Negative 06/26/2012 2030   PROTEINUR >=500 06/26/2012 2030   NITRITE Negative 06/26/2012 2030   LEUKOCYTESUR Negative 06/26/2012 2030     RADIOLOGY: Dg Chest 2 View  Result Date: 02/22/2016 CLINICAL DATA:  Shortness of breath, nausea and vomiting today. Symptoms began prior to dialysis this morning. EXAM: CHEST  2 VIEW COMPARISON:  PA and lateral chest 12/30/2015 and 05/02/2015. FINDINGS: There is cardiomegaly without edema. Patchy bibasilar airspace disease is identified, greater on the right. No pneumothorax. Likely small bilateral pleural effusions are seen. Aortic atherosclerosis is noted. IMPRESSION: Bibasilar airspace disease is worse on the right and could be due to atelectasis or infection. Very small bilateral pleural effusions. Cardiomegaly. Atherosclerosis. Electronically Signed   By: Inge Rise M.D.   On: 02/22/2016 16:14    EKG: Orders placed or performed during the hospital encounter of 02/22/16  . EKG 12-Lead  . EKG 12-Lead  . ED EKG within 10 minutes  . ED EKG  within 10 minutes    IMPRESSION AND PLAN: Patient is a 60 year old African-American female with end-stage renal disease with chest  1. Chest pressure with known coronary artery disease Troponin is slightly abnormal in a patient on dialysis We'll ask her cardiologist to see We'll cycle her troponins Use when necessary nitroglycerin  2. Mild acute COPD exasperation I will treat patient with steroids Nebulizers Chest x-ray with possible atypical pneumonia we'll treat with IV Levaquin  3. End-stage renal disease I'll have nephrology see the patient for dialysis  4. Diabetes type 2 We'll place patient on sliding scale insulin  5. Accelerated hypertension Continue Coreg, clonidine  6. Miscellaneous heparin for DVT prophylaxis   All the records are reviewed and case discussed with ED provider. Management plans discussed with the patient, family and they are in agreement.  CODE STATUS:    Code Status Orders        Start     Ordered   02/22/16 1901  Full code  Continuous     02/22/16 1902    Code Status History    Date Active Date Inactive Code Status Order ID Comments User Context   05/07/2015  7:43 PM 05/08/2015  7:08 PM Full Code [id number]  Bettey Costa, MD Inpatient   05/02/2015  4:48 PM 05/04/2015  8:14 PM Full Code [id number]  Vaughan Basta, MD Inpatient   05/02/2015  3:31 PM 05/02/2015  4:48 PM Full Code [id number]  [name], MD Inpatient   11/23/2014 10:42 PM 11/25/2014  3:29 PM Full Code [id number]  Demetrios Loll, MD Inpatient       TOTAL TIME TAKING CARE OF THIS PATIENT28minutes.    Dustin Flock M.D on 02/22/2016 at 7:06 PM  Between 7am to 6pm - Pager - [phone]  After 6pm go to [email or web address] - password [email or web address] [facility]  Office  [phone]  CC: Primary care physician; Mable Paris, FNP

## 2016-02-22 NOTE — Progress Notes (Signed)
Pharmacy Antibiotic Note  Theresa [name] is a 60 y.o. female admitted on 02/22/2016 with COPD, possible pneumonia.  Pharmacy has been consulted for Levaquin dosing.  Plan: Levaquin 750 mg iv once then 500 mg iv q 48 hours.   Height: 5' 7.5" (171.5 cm) Weight: 179 lb (81.2 kg) IBW/kg (Calculated) : 62.75  Temp (24hrs), Avg:98.5 F (36.9 C), Min:98.5 F (36.9 C), Max:98.5 F (36.9 C)   Recent Labs Lab 02/22/16 1641  WBC 9.0  CREATININE 9.16*    Estimated Creatinine Clearance: 7.4 mL/min (by C-G formula based on SCr of 9.16 mg/dL (H)).    Allergies  Allergen Reactions  . Hydrocodone Hives  . Metformin Diarrhea    Antimicrobials this admission: Levaquin 9/15 >>    Dose adjustments this admission:   Microbiology results: 9/15 BCx: pending  Thank you for allowing pharmacy to be a part of this patient's care.  Napoleon Form 02/22/2016 7:26 PM

## 2016-02-22 NOTE — ED Triage Notes (Signed)
Pt to ED via EMS from dialysis c/o chest pain that started this morning before dialysis and gradually got worse during treatment, causing pt SOB, nausea and vomiting x2.   Pt reports completing 30 minutes of 3 hour 15 minute total treatment.  Pt hypertensive for EMS, EMS administered 2 inch nitro paste.  Pt A&Ox4, speaking in complete and coherent sentences, and in NAD at this time.

## 2016-02-23 DIAGNOSIS — N2581 Secondary hyperparathyroidism of renal origin: Secondary | ICD-10-CM | POA: Diagnosis not present

## 2016-02-23 DIAGNOSIS — I340 Nonrheumatic mitral (valve) insufficiency: Secondary | ICD-10-CM

## 2016-02-23 DIAGNOSIS — R0789 Other chest pain: Secondary | ICD-10-CM | POA: Diagnosis not present

## 2016-02-23 DIAGNOSIS — N186 End stage renal disease: Secondary | ICD-10-CM | POA: Diagnosis not present

## 2016-02-23 DIAGNOSIS — D631 Anemia in chronic kidney disease: Secondary | ICD-10-CM | POA: Diagnosis not present

## 2016-02-23 DIAGNOSIS — I25810 Atherosclerosis of coronary artery bypass graft(s) without angina pectoris: Secondary | ICD-10-CM | POA: Diagnosis not present

## 2016-02-23 DIAGNOSIS — I120 Hypertensive chronic kidney disease with stage 5 chronic kidney disease or end stage renal disease: Secondary | ICD-10-CM | POA: Diagnosis not present

## 2016-02-23 DIAGNOSIS — J189 Pneumonia, unspecified organism: Secondary | ICD-10-CM | POA: Diagnosis not present

## 2016-02-23 DIAGNOSIS — I10 Essential (primary) hypertension: Secondary | ICD-10-CM

## 2016-02-23 LAB — BASIC METABOLIC PANEL
ANION GAP: 13 (ref 5–15)
BUN: 42 mg/dL — ABNORMAL HIGH (ref 6–20)
CALCIUM: 8.8 mg/dL — AB (ref 8.9–10.3)
CO2: 30 mmol/L (ref 22–32)
CREATININE: 10.11 mg/dL — AB (ref 0.44–1.00)
Chloride: 98 mmol/L — ABNORMAL LOW (ref 101–111)
GFR, EST AFRICAN AMERICAN: 4 mL/min — AB (ref >60)
GFR, EST NON AFRICAN AMERICAN: 4 mL/min — AB (ref >60)
Glucose, Bld: 199 mg/dL — ABNORMAL HIGH (ref 65–99)
Potassium: 4.3 mmol/L (ref 3.5–5.1)
SODIUM: 141 mmol/L (ref 135–145)

## 2016-02-23 LAB — MRSA PCR SCREENING: MRSA BY PCR: NEGATIVE

## 2016-02-23 LAB — TROPONIN I
TROPONIN I: 0.03 ng/mL — AB (ref <0.03)
Troponin I: 0.03 ng/mL (ref <0.03)

## 2016-02-23 LAB — GLUCOSE, CAPILLARY: Glucose-Capillary: 269 mg/dL — ABNORMAL HIGH (ref 65–99)

## 2016-02-23 MED ORDER — LEVOFLOXACIN 500 MG PO TABS: 500.0000 mg | ORAL_TABLET | ORAL | 0 refills | Status: AC

## 2016-02-23 MED ORDER — CARVEDILOL 25 MG PO TABS: 25.0000 mg | ORAL_TABLET | Freq: Two times a day (BID) | ORAL | Status: DC

## 2016-02-23 MED ORDER — LOSARTAN POTASSIUM 25 MG PO TABS: 25.0000 mg | ORAL_TABLET | Freq: Every day | ORAL | 0 refills | Status: DC

## 2016-02-23 MED ORDER — CARVEDILOL 25 MG PO TABS: 25.0000 mg | ORAL_TABLET | Freq: Two times a day (BID) | ORAL | 0 refills | Status: DC

## 2016-02-23 MED ORDER — TRAMADOL HCL 50 MG PO TABS
50.0000 mg | ORAL_TABLET | Freq: Three times a day (TID) | ORAL | Status: DC | PRN
  Administered 2016-02-23: 50 mg via ORAL
  Filled 2016-02-23: qty 1

## 2016-02-23 MED ORDER — HYDRALAZINE HCL 20 MG/ML IJ SOLN
10.0000 mg | INTRAMUSCULAR | Status: DC | PRN
  Administered 2016-02-23: 10 mg via INTRAVENOUS
  Filled 2016-02-23: qty 1

## 2016-02-23 MED ORDER — LOSARTAN POTASSIUM 25 MG PO TABS
25.0000 mg | ORAL_TABLET | Freq: Every day | ORAL | Status: DC
  Filled 2016-02-23: qty 1

## 2016-02-23 NOTE — Consult Note (Signed)
Cardiology Consultation   Patient ID: [name]; [id number]; 1956/05/20   Admit date: 02/22/2016 Date of Consult: 02/23/2016  Referring MD:  Dr. Benjie Karvonen  Patient Care Team: Burnard Hawthorne, FNP as PCP - General (Family Medicine) Alisa Graff, FNP as Nurse Practitioner (Family Medicine) Minna Merritts, MD as Consulting Physician (Cardiology) Anthonette Legato, MD as Consulting Physician (Nephrology)   Primary cardfiologist: Dr. Rockey Situ  Reason for Consultation: CP   History of Present Illness: Theresa [name] is a 60 y.o. female with a hx of CAD s/p PCI/DES to mid RCA in 05/2013, chronic combined systolic and diastolic CHF/ICM now with normalized EF by echo 11/2014, ESRD on HD (MWF), prior tobacco abuse, bilateral carotid artery disease/PVD, HTN, COPD, anemia of chronic disease, and DM2 with diabetic neuropathy/retinopathy   Patient presented with chest tightness 02/22/2016 in the morning. She described this as a heaviness in the center of the chest, moderate intensity, nonradiating. She was at dialysis when this happened. Since it was not going away after 30 minutes, they decided to bring her to the ER. BP in ER was elevated at 224/75. After several breathing treatments, she noted relief of chest pain. She is also on antibiotics for possible pneumonia. Overall, she has noted improvement in her chest pain. Occasionally, there is note of CP with breathing  With regard to her blood pressure, she has noted elevated measurements recently. She was taking carvedilol and clonidine for her blood pressure.  In terms of end-stage renal disease, she only completed 30 minutes of dialysis yesterday. Her next dialysis will be on Monday.  Patient denies PND, orthopnea, edema. No abdominal pain. No nausea, vomiting, diaphoresis.  ROS:  Please see the history of present illness.  ROS All other ROS reviewed and negative.    Past Medical History:  Diagnosis Date  . Anemia of chronic disease   .  Carotid arterial disease ([place])    a. 02/2013 U/S: 40-59% bilat ICA stenosis - *f/u 02/2014*  . Chronic constipation   . Chronic diastolic CHF (congestive heart failure) ([place])    a. 10/2013 [facility]): EF 55-60%, mod conc LVH, mod MR, mildly dil LA, mild Ao sclerosis w/o stenosis.  . Colon polyps   . COPD (chronic obstructive pulmonary disease) ([place])   . Coronary artery disease    a. 05/2013 NSTEMI/PCI: LM 20d, LAD min irregs, LCX small, nl, OM1 nl, RCA dom 35m (2.5x16 Promus DES), PDA1 80p.  . Diabetes mellitus   . Diabetic neuropathy ([place])   . Diabetic retinopathy ([place]) 05/28/2013   Hx bilat retinal detachment, proliferative diab retinopathy and bilat vitreous hemorrhage   . Emphysema   . ESRD on hemodialysis ([place])    a. [facility] in [place], [place], on a TTS schedule.  She started dialysis in Feb 2014.  Etiology of renal failure not known, likely diabetes.  Has a left upper arm AV graft.  . History of bronchitis    Mar 2012  . History of pneumonia    June 2012  . History of tobacco abuse    a. Quit 2012.  Marland Kitchen Hyperlipidemia   . Hypertension   . Moderate mitral insufficiency    a. 10/2013 Echo: EF 55-60%, mod MR.  . Peripheral vascular disease ([place])   . Sickle cell trait [facility])     Past Surgical History:  Procedure Laterality Date  . ABDOMINAL HYSTERECTOMY     2000  . CARDIAC CATHETERIZATION    . CARDIAC CATHETERIZATION N/A 05/02/2015   Procedure: Left  Heart Cath and Coronary Angiography;  Surgeon: [name], MD;  Location: [place] [facility];  Service: Cardiovascular;  Laterality: N/A;  . colonscopy    . CORONARY ANGIOPLASTY  05/28/2014   stent placement to the mid RCA  . DILATION AND CURETTAGE OF UTERUS     several in the early 80's  . ESOPHAGOGASTRODUODENOSCOPY     2012  . EYE SURGERY     bilateral laser 2012  . EYE SURGERY     right  . GAS INSERTION  09/30/2011   Procedure: INSERTION OF GAS;  Surgeon: [name], MD;  Location: [place];  Service: Ophthalmology;   Laterality: Right;  [id number]  . GAS/FLUID EXCHANGE  09/30/2011   Procedure: GAS/FLUID EXCHANGE;  Surgeon: [name], MD;  Location: [place];  Service: Ophthalmology;  Laterality: Right;  . LEFT HEART CATHETERIZATION WITH CORONARY ANGIOGRAM N/A 05/28/2013   Procedure: LEFT HEART CATHETERIZATION WITH CORONARY ANGIOGRAM;  Surgeon: [name], MD;  Location: [facility] CATH LAB;  Service: Cardiovascular;  Laterality: N/A;  . PARS PLANA VITRECTOMY  04/22/2011   Procedure: PARS PLANA VITRECTOMY WITH 25 GAUGE;  Surgeon: [name], MD;  Location: [place];  Service: Ophthalmology;  Laterality: Left;  membrane peel, endolaser, gas fluid exchange, silicone oil, repair of complex traction retinal detachment  . PARS PLANA VITRECTOMY  09/30/2011   Procedure: PARS PLANA VITRECTOMY WITH 25 GAUGE;  Surgeon: [name], MD;  Location: [place];  Service: Ophthalmology;  Laterality: Right;  Endolaser; Repair of Complex Traction Retinal Detachment  . PARS PLANA VITRECTOMY  02/24/2012   Procedure: PARS PLANA VITRECTOMY WITH 25 GAUGE;  Surgeon: [name], MD;  Location: [place];  Service: Ophthalmology;  Laterality: Left;  . PTCA    . SILICON OIL REMOVAL  7/82/9562   Procedure: SILICON OIL REMOVAL;  Surgeon: [name], MD;  Location: [place];  Service: Ophthalmology;  Laterality: Left;  . THROMBECTOMY / ARTERIOVENOUS GRAFT REVISION    . TUBAL LIGATION     1979      Home Meds: Prior to Admission medications   Medication Sig Start Date End Date Taking? Authorizing Provider  albuterol (PROVENTIL HFA;VENTOLIN HFA) 108 (90 BASE) MCG/ACT inhaler Inhale 2 puffs into the lungs every 6 (six) hours as needed for wheezing or shortness of breath.   Yes Historical Provider, MD  albuterol (PROVENTIL) (2.5 MG/3ML) 0.083% nebulizer solution Take 2.5 mg by nebulization every 4 (four) hours as needed for wheezing or shortness of breath.   Yes Historical Provider, MD  aspirin EC 81 MG tablet Take 81 mg by mouth daily.    Yes Historical Provider, MD  calcium acetate (PHOSLO) 667 MG capsule Take [phone] mg by mouth 3 (three) times daily with meals. Take 5 capsules if you have anything with cheese, otherwise, take 4 capsules if you don't have cheese.   Yes Historical Provider, MD  carvedilol (COREG) 12.5 MG tablet Take 1 tablet (12.5 mg total) by mouth 2 (two) times daily. If bp >160, take 2 tablets, otherwise, take 1 tablet twice daily. 02/09/16  Yes Alisa Graff, FNP  cloNIDine (CATAPRES) 0.1 MG tablet Take 1 tablet (0.1 mg total) by mouth 2 (two) times daily. 01/30/15  Yes Minna Merritts, MD  CRESTOR 40 MG tablet TAKE ONE TABLET BY MOUTH ONCE DAILY 08/09/15  Yes Minna Merritts, MD  furosemide (LASIX) 80 MG tablet Take 80 mg by mouth daily. Only on Saturdays, Sundays, Tuesdays and Thursdays. (non-dialysis  days)   Yes Historical Provider, MD  Melatonin 1 MG CAPS Take 1 capsule by mouth daily.   Yes Historical Provider, MD  multivitamin (RENA-VIT) TABS tablet Take 1 tablet by mouth daily.   Yes Historical Provider, MD  pregabalin (LYRICA) 75 MG capsule Take 1 capsule (75 mg total) by mouth daily. 03/28/14  Yes Jackolyn Confer, MD  carvedilol (COREG) 25 MG tablet Take 1 tablet (25 mg total) by mouth 2 (two) times daily with a meal. 02/23/16   Bettey Costa, MD  levofloxacin (LEVAQUIN) 500 MG tablet Take 1 tablet (500 mg total) by mouth every other day. 02/24/16 03/01/16  Bettey Costa, MD  losartan (COZAAR) 25 MG tablet Take 1 tablet (25 mg total) by mouth daily. 02/23/16   Bettey Costa, MD  nitroGLYCERIN (NITROSTAT) 0.4 MG SL tablet Place 1 tablet (0.4 mg total) under the tongue every 5 (five) minutes as needed for chest pain. 09/26/15   Alisa Graff, FNP    Current Medications: . albuterol  2.5 mg Nebulization Q6H  . aspirin EC  81 mg Oral Daily  . calcium acetate  2,668 mg Oral TID WC  . carvedilol  25 mg Oral BID WC  . cloNIDine  0.1 mg Oral BID  . furosemide  80 mg Oral Daily  . heparin  5,000 Units Subcutaneous  Q8H  . insulin aspart  0-9 Units Subcutaneous TID WC  . [START ON 02/24/2016] levofloxacin (LEVAQUIN) IV  500 mg Intravenous Q48H  . losartan  25 mg Oral Daily  . Melatonin  2.5 mg Oral QHS  . multivitamin  1 tablet Oral Daily  . pregabalin  75 mg Oral Daily  . rosuvastatin  40 mg Oral Daily  . sodium chloride flush  3 mL Intravenous Q12H  . sodium chloride flush  3 mL Intravenous Q12H     Allergies:    Allergies  Allergen Reactions  . Hydrocodone Hives  . Metformin Diarrhea    Social History:   The patient  reports that she quit smoking about 5 years ago. She has a 25.00 pack-year smoking history. She has never used smokeless tobacco. She reports that she does not drink alcohol or use drugs.    Family History:   The patient's family history includes Breast cancer in her sister; Colon cancer in her father and sister; Diabetes in her brother, brother, and daughter; Heart attack in her brother and mother; Heart disease in her mother; Stroke in her brother and mother.       PHYSICAL EXAM: VS:  BP (!) 185/54   Pulse 78   Temp 98.2 F (36.8 C) (Oral)   Resp 18   Ht 5' 7.5" (1.715 m)   Wt 179 lb (81.2 kg)   SpO2 95%   BMI 27.62 kg/m  , BMI Body mass index is 27.62 kg/m. GENERAL:  well developed, well nourished, not in acute distress HEENT: normocephalic, pink conjunctivae, anicteric sclerae, no xanthelasma, normal dentition, oropharynx clear NECK:  mild neck vein engorgement, no hepatojugular reflux, carotid upstroke brisk and symmetric, no bruit, no thyromegaly, no lymphadenopathy LUNGS:  good respiratory effort, clear to auscultation bilaterally CV:  PMI not displaced, no thrills, no lifts, S1 and S2 within normal limits, no palpable S3 or S4,3-4/6 HSM (pt known to have MR), no rubs, no gallops ABD:  Soft, nontender, nondistended, normoactive bowel sounds, no abdominal aortic bruit, no hepatomegaly, no splenomegaly MS: nontender back, no kyphosis, no scoliosis, no joint  deformities EXT:  2+ DP/PT pulses,  no edema, no varicosities, no cyanosis, no clubbing SKIN: warm, nondiaphoretic, normal turgor, no ulcers NEUROPSYCH: alert, oriented to person, place, and time, sensory/motor grossly intact, normal mood, appropriate affect    EKG:  Ekg from 02/22/2016 15:47 was personally reviewed by me and it showed SR, LVH, no significant change from 02/05/2016 EKG.   Labs:  Recent Labs  02/22/16 1641 02/22/16 2316 02/23/16 0418  TROPONINI 0.03* 0.03* 0.03*   Lab Results  Component Value Date   WBC 9.0 02/22/2016   HGB 10.2 (L) 02/22/2016   HCT 29.9 (L) 02/22/2016   MCV 102.9 (H) 02/22/2016   PLT 124 (L) 02/22/2016    Recent Labs Lab 02/23/16 0418  NA 141  K 4.3  CL 98*  CO2 30  BUN 42*  CREATININE 10.11*  CALCIUM 8.8*  GLUCOSE 199*   Lab Results  Component Value Date   CHOL 125 11/24/2014   HDL 59 11/24/2014   LDLCALC 52 11/24/2014   TRIG 70 11/24/2014   No results found for: DDIMER  Radiology/Studies:  Dg Chest 2 View  Result Date: 02/22/2016 CLINICAL DATA:  Shortness of breath, nausea and vomiting today. Symptoms began prior to dialysis this morning. EXAM: CHEST  2 VIEW COMPARISON:  PA and lateral chest 12/30/2015 and 05/02/2015. FINDINGS: There is cardiomegaly without edema. Patchy bibasilar airspace disease is identified, greater on the right. No pneumothorax. Likely small bilateral pleural effusions are seen. Aortic atherosclerosis is noted. IMPRESSION: Bibasilar airspace disease is worse on the right and could be due to atelectasis or infection. Very small bilateral pleural effusions. Cardiomegaly. Atherosclerosis. Electronically Signed   By: Inge Rise M.D.   On: 02/22/2016 16:14   LHC 05/02/2015:  The left ventricular systolic function is normal.  Mid LAD-1 lesion, 30% stenosed.  Mid LAD-2 lesion, 20% stenosed.  Prox Cx to Mid Cx lesion, 35% stenosed.  1st RPLB lesion, 30% stenosed.  Ost RPDA lesion, 70% stenosed.    1. Significant one-vessel coronary artery disease with patent stent in the midright coronary artery. 70% ostial right PDA stenosis which is unchanged from most recent cardiac catheterization in 2014. A faint collateral is noted from right to left supplying what seems to be a small septal branch of the LAD. The left system has mild nonobstructive disease. 2. Normal LV systolic function. 3. Severely elevated blood pressure with moderately elevated left ventricular end-diastolic pressure.  Recommendations: The patient's dyspnea is likely due to chronic diastolic heart failure with uncontrolled hypertension. Recommend blood pressure control  PROBLEM LIST:  Active Problems:   Chest pressure     ASSESSMENT AND PLAN:  Chest pain Likely multifactorial  Improved with inhalers. There was pleuriitc nature to her CP. She may have underlying pulmonary issues, management per primary team. CP may have also been related to uncontrolled BP. Troponins are minimally elevated, especially in the context of elevated creatinine of 10. Troponin elevation likely related to end-stage renal disease.  CAD s/p PCI  Left heart cath from November 2016 showed patent stents in the midright coronary artery. 70% ostial right PDA stenosis which is unchanged from most recent cardiac catheterization in 2014.   EKG on admission is unchanged from 02/05/2016, negative for ischemia. Troponins essentially unremarkable for ischemic events. No indication for further ischemia eval at this time.  Continue medical therapy, aspirin 81 mg daily, Coreg, and Crestor. LDL goal < 70.   Chronic combined CHF No evidence of acute decompensated congestive heart failure. . Fluid managed per renal through HD.  Limit salt  and PO fluid consumption.   HTN, uncontrolled Agree with increasing carvedilol to 25 mg by mouth twice a day. If blood pressure still elevated greater than 140/80, may consider other options for blood pressure control:  Imdur ER 30mg  po qd, amlodipine 5mg  po qd.   Moderate mitral regurgitation Recommend echo. Echo was ordered for 02/26/2016 as an outpatient.   ESRD on HD: MWF. Per Renal.   Anemia of chronic disease  I spent at least 70 minutes with the patient today and more than 50% of the time was spent counseling the patient and coordinating care.   Signed, Wende Bushy, MD  02/23/2016 10:07 AM  [place]

## 2016-02-23 NOTE — Progress Notes (Signed)
[facility]  ROUNDING NOTE   Subjective:   Admitted for shortness of breath, chest pain from hemodialysis. Diagnosed with pneumonia.  Started on levaquin.   Objective:  Vital signs in last 24 hours:  Temp:  [98.2 F (36.8 C)-98.7 F (37.1 C)] 98.2 F (36.8 C) (09/16 0509) Pulse Rate:  [66-80] 78 (09/16 0749) Resp:  [14-22] 18 (09/16 0509) BP: (137-224)/(43-75) 185/54 (09/16 0749) SpO2:  [90 %-100 %] 95 % (09/16 0829) Weight:  [81.2 kg (179 lb)] 81.2 kg (179 lb) (09/15 1551)  Weight change:  Filed Weights   02/22/16 1551  Weight: 81.2 kg (179 lb)    Intake/Output: No intake/output data recorded.   Intake/Output this shift:  Total I/O In: 240 [P.O.:240] Out: -   Physical Exam: General: NAD, sitting in bed  Head: Normocephalic, atraumatic. Moist oral mucosal membranes  Eyes: Anicteric, PERRL  Neck: Supple, trachea midline  Lungs:  Clear to auscultation  Heart: Regular rate and rhythm  Abdomen:  Soft, nontender,   Extremities: no peripheral edema.  Neurologic: Nonfocal, moving all four extremities  Skin: No lesions  Access: Left AVG    Basic Metabolic Panel:  Recent Labs Lab 02/22/16 1641 02/23/16 0418  NA 139 141  K 4.0 4.3  CL 98* 98*  CO2 31 30  GLUCOSE 143* 199*  BUN 32* 42*  CREATININE 9.16* 10.11*  CALCIUM 8.9 8.8*    Liver Function Tests: No results for input(s): AST, ALT, ALKPHOS, BILITOT, PROT, ALBUMIN in the last 168 hours. No results for input(s): LIPASE, AMYLASE in the last 168 hours. No results for input(s): AMMONIA in the last 168 hours.  CBC:  Recent Labs Lab 02/22/16 1641  WBC 9.0  HGB 10.2*  HCT 29.9*  MCV 102.9*  PLT 124*    Cardiac Enzymes:  Recent Labs Lab 02/22/16 1641 02/22/16 2316 02/23/16 0418  TROPONINI 0.03* 0.03* 0.03*    BNP: Invalid input(s): POCBNP  CBG:  Recent Labs Lab 02/23/16 0756  GLUCAP 49*    Microbiology: Results for orders placed or performed during the hospital  encounter of 02/22/16  Blood culture (routine x 2)     Status: None (Preliminary result)   Collection Time: 02/22/16  6:20 PM  Result Value Ref Range Status   Specimen Description BLOOD BLOOD RIGHT HAND  Final   Special Requests BOTTLES DRAWN AEROBIC AND ANAEROBIC 6CC  Final   Culture NO GROWTH < 12 HOURS  Final   Report Status PENDING  Incomplete  Blood culture (routine x 2)     Status: None (Preliminary result)   Collection Time: 02/22/16  6:20 PM  Result Value Ref Range Status   Specimen Description BLOOD RIGHT ANTECUBITAL  Final   Special Requests BOTTLES DRAWN AEROBIC AND ANAEROBIC  3CC  Final   Culture NO GROWTH < 12 HOURS  Final   Report Status PENDING  Incomplete  MRSA PCR Screening     Status: None   Collection Time: 02/23/16 12:58 AM  Result Value Ref Range Status   MRSA by PCR NEGATIVE NEGATIVE Final    Comment:        The GeneXpert MRSA Assay (FDA approved for NASAL specimens only), is one component of a comprehensive MRSA colonization surveillance program. It is not intended to diagnose MRSA infection nor to guide or monitor treatment for MRSA infections.     Coagulation Studies: No results for input(s): LABPROT, INR in the last 72 hours.  Urinalysis: No results for input(s): COLORURINE, LABSPEC, [place], [place], [place],  BILIRUBINUR, KETONESUR, PROTEINUR, UROBILINOGEN, NITRITE, LEUKOCYTESUR in the last 72 hours.  Invalid input(s): APPERANCEUR    Imaging: Dg Chest 2 View  Result Date: 02/22/2016 CLINICAL DATA:  Shortness of breath, nausea and vomiting today. Symptoms began prior to dialysis this morning. EXAM: CHEST  2 VIEW COMPARISON:  PA and lateral chest 12/30/2015 and 05/02/2015. FINDINGS: There is cardiomegaly without edema. Patchy bibasilar airspace disease is identified, greater on the right. No pneumothorax. Likely small bilateral pleural effusions are seen. Aortic atherosclerosis is noted. IMPRESSION: Bibasilar airspace disease is worse on the right  and could be due to atelectasis or infection. Very small bilateral pleural effusions. Cardiomegaly. Atherosclerosis. Electronically Signed   By: Inge Rise M.D.   On: 02/22/2016 16:14     Medications:     . albuterol  2.5 mg Nebulization Q6H  . aspirin EC  81 mg Oral Daily  . calcium acetate  2,668 mg Oral TID WC  . carvedilol  25 mg Oral BID WC  . cloNIDine  0.1 mg Oral BID  . furosemide  80 mg Oral Daily  . heparin  5,000 Units Subcutaneous Q8H  . insulin aspart  0-9 Units Subcutaneous TID WC  . [START ON 02/24/2016] levofloxacin (LEVAQUIN) IV  500 mg Intravenous Q48H  . losartan  25 mg Oral Daily  . Melatonin  2.5 mg Oral QHS  . multivitamin  1 tablet Oral Daily  . pregabalin  75 mg Oral Daily  . rosuvastatin  40 mg Oral Daily  . sodium chloride flush  3 mL Intravenous Q12H  . sodium chloride flush  3 mL Intravenous Q12H   sodium chloride, acetaminophen **OR** acetaminophen, hydrALAZINE, nitroGLYCERIN, ondansetron **OR** ondansetron (ZOFRAN) IV, sodium chloride flush, traMADol  Assessment/ Plan:  [name] is a 60 y.o. black female with history of diabetes type 2 with severe complications including diabetic retinopathy, neuropathy,nephropathy, long standing hypertension, COPD, hyperlipidemia, congestive heart failure  with EF 45-50%  MWF [place].   1. End Stage Renal Disease: hemodialysis incomplete yesterday. However no acute indication for dialysis at this time. Resume MWF schedule.   2. Hypertension: at goal. Elevated on admission - clonidine, carvedilol and furosemide.   3. Secondary Hyperparathyroidism: calcium at goal.  - calcium acetate with meals.   4. Anemia of chronic kidney disease: hemoglobin at goal. 10.2 - epo as outpatient.     LOS: 0 Satcha Storlie 9/16/201711:53 AM

## 2016-02-23 NOTE — Progress Notes (Signed)
A & O. Room air. NSR. Takes meds ok. Son here to take home. IV and tele removed. Discharge instructions given to pt. Pt has no further concerns at this time.

## 2016-02-23 NOTE — Progress Notes (Signed)
Patient requested for a stronger pain medication. Dr. Estanislado Pandy notified with a new order for Tramadol 50 mg oral every 8 hours PRN. Will continue to monitor.

## 2016-02-23 NOTE — Discharge Summary (Signed)
[place] [facility] [place] NAME: Theresa [name]    MR#:  [id number]  DATE OF BIRTH:  1955-06-15  DATE OF ADMISSION:  02/22/2016 ADMITTING PHYSICIAN: Dustin Flock, MD  DATE OF DISCHARGE: 02/23/2016   PRIMARY CARE PHYSICIAN: Mable Paris, FNP    ADMISSION DIAGNOSIS:  Chest pain, unspecified chest pain type [R07.9]  DISCHARGE DIAGNOSIS:  Active Problems:   Chest pressure   SECONDARY DIAGNOSIS:   Past Medical History:  Diagnosis Date  . Anemia of chronic disease   . Carotid arterial disease ([place])    a. 02/2013 U/S: 40-59% bilat ICA stenosis - *f/u 02/2014*  . Chronic constipation   . Chronic diastolic CHF (congestive heart failure) ([place])    a. 10/2013 Echo [facility]): EF 55-60%, mod conc LVH, mod MR, mildly dil LA, mild Ao sclerosis w/o stenosis.  . Colon polyps   . COPD (chronic obstructive pulmonary disease) ([place])   . Coronary artery disease    a. 05/2013 NSTEMI/PCI: LM 20d, LAD min irregs, LCX small, nl, OM1 nl, RCA dom 23m (2.5x16 Promus DES), PDA1 80p.  . Diabetes mellitus   . Diabetic neuropathy ([place])   . Diabetic retinopathy ([place]) 05/28/2013   Hx bilat retinal detachment, proliferative diab retinopathy and bilat vitreous hemorrhage   . Emphysema   . ESRD on hemodialysis ([place])    a. [facility] in [place], [place], on a TTS schedule.  She started dialysis in Feb 2014.  Etiology of renal failure not known, likely diabetes.  Has a left upper arm AV graft.  . History of bronchitis    Mar 2012  . History of pneumonia    June 2012  . History of tobacco abuse    a. Quit 2012.  Marland Kitchen Hyperlipidemia   . Hypertension   . Moderate mitral insufficiency    a. 10/2013 Echo: EF 55-60%, mod MR.  . Peripheral vascular disease ([place])   . Sickle cell trait [facility])     HOSPITAL COURSE:  60 year old female with history of ESRD and essential hypertension who presented chest pressure. For further details please further H&P.  1. Chest pressure: This is not due to  ACS. Patient had troponins 3 which were at 0.03. Elevation troponin was due to ESRD with poor renal clearance. Her chest pressure was most likely due to very minimal COPD exacerbation along with community-acquired pneumonia. She had no chest pressure while in the hospital. Telemetry showed no acute changes. She was seen by cardiology. She has outpatient follow-up on Tuesday for an echocardiogram that had already been ordered prior to admission.   2. ESRD and hematemesis: Patient will resume her routine dialysis schedule Monday, Wednesday and Friday.  3. Community-acquired pneumonia: I am suspecting that her symptoms of chest pressure were due to pneumonia. She will be discharged on Levaquin which is renally dosed.   4. Mild COPD exacerbation: On examination patient had no wheezing. She did not want to take prednisone at discharge. I do feel the patient needs steroids at discharge. She will continue outpatient inhalers.  5. Accelerated hypertension: Due to elevation in her blood pressure I increased metoprolol and started low-dose losartan. She will continue on clonidine. She needs outpatient follow-up for her blood pressure.  DISCHARGE CONDITIONS AND DIET:   Patient is stable for discharge and renal diet   CONSULTS OBTAINED:  Treatment Team:  Wende Bushy, MD Lavonia Dana, MD  DRUG ALLERGIES:   Allergies  Allergen Reactions  . Hydrocodone Hives  . Metformin Diarrhea  DISCHARGE MEDICATIONS:   Current Discharge Medication List    START taking these medications   Details  levofloxacin (LEVAQUIN) 500 MG tablet Take 1 tablet (500 mg total) by mouth every other day. Qty: 4 tablet, Refills: 0    losartan (COZAAR) 25 MG tablet Take 1 tablet (25 mg total) by mouth daily. Qty: 30 tablet, Refills: 0      CONTINUE these medications which have CHANGED   Details  carvedilol (COREG) 25 MG tablet Take 1 tablet (25 mg total) by mouth 2 (two) times daily with a meal. Qty: 60 tablet,  Refills: 0      CONTINUE these medications which have NOT CHANGED   Details  albuterol (PROVENTIL HFA;VENTOLIN HFA) 108 (90 BASE) MCG/ACT inhaler Inhale 2 puffs into the lungs every 6 (six) hours as needed for wheezing or shortness of breath.    albuterol (PROVENTIL) (2.5 MG/3ML) 0.083% nebulizer solution Take 2.5 mg by nebulization every 4 (four) hours as needed for wheezing or shortness of breath.    aspirin EC 81 MG tablet Take 81 mg by mouth daily.    calcium acetate (PHOSLO) 667 MG capsule Take [phone] mg by mouth 3 (three) times daily with meals. Take 5 capsules if you have anything with cheese, otherwise, take 4 capsules if you don't have cheese.    cloNIDine (CATAPRES) 0.1 MG tablet Take 1 tablet (0.1 mg total) by mouth 2 (two) times daily. Qty: 60 tablet, Refills: 11    CRESTOR 40 MG tablet TAKE ONE TABLET BY MOUTH ONCE DAILY Qty: 90 tablet, Refills: 3    furosemide (LASIX) 80 MG tablet Take 80 mg by mouth daily. Only on Saturdays, Sundays, Tuesdays and Thursdays. (non-dialysis days)    Melatonin 1 MG CAPS Take 1 capsule by mouth daily.    multivitamin (RENA-VIT) TABS tablet Take 1 tablet by mouth daily.    pregabalin (LYRICA) 75 MG capsule Take 1 capsule (75 mg total) by mouth daily. Qty: 30 capsule, Refills: 3    nitroGLYCERIN (NITROSTAT) 0.4 MG SL tablet Place 1 tablet (0.4 mg total) under the tongue every 5 (five) minutes as needed for chest pain. Qty: 25 tablet, Refills: 3              Today   CHIEF COMPLAINT:  Patient doing well this morning. Denies chest pressure or shortness of breath.   VITAL SIGNS:  Blood pressure (!) 185/54, pulse 78, temperature 98.2 F (36.8 C), temperature source Oral, resp. rate 18, height 5' 7.5" (1.715 m), weight 81.2 kg (179 lb), SpO2 95 %.   REVIEW OF SYSTEMS:  Review of Systems  Constitutional: Negative.  Negative for chills, fever and malaise/fatigue.  HENT: Negative.  Negative for ear discharge, ear pain,  hearing loss, nosebleeds and sore throat.   Eyes: Negative.  Negative for blurred vision and pain.  Respiratory: Negative.  Negative for cough, hemoptysis, shortness of breath and wheezing.   Cardiovascular: Negative.  Negative for chest pain, palpitations and leg swelling.  Gastrointestinal: Negative.  Negative for abdominal pain, blood in stool, diarrhea, nausea and vomiting.  Genitourinary: Negative.  Negative for dysuria.  Musculoskeletal: Negative.  Negative for back pain.  Skin: Negative.   Neurological: Negative for dizziness, tremors, speech change, focal weakness, seizures and headaches.  Endo/Heme/Allergies: Negative.  Does not bruise/bleed easily.  Psychiatric/Behavioral: Negative.  Negative for depression, hallucinations and suicidal ideas.     PHYSICAL EXAMINATION:  GENERAL:  60 y.o.-year-old patient lying in the bed with no acute distress.  NECK:  Supple, no jugular venous distention. No thyroid enlargement, no tenderness.  LUNGS:crackles right base no wheezing, rales,rhonchi  No use of accessory muscles of respiration.  CARDIOVASCULAR: S1, S2 normal. No murmurs, rubs, or gallops.  ABDOMEN: Soft, non-tender, non-distended. Bowel sounds present. No organomegaly or mass.  EXTREMITIES: No pedal edema, cyanosis, or clubbing.  PSYCHIATRIC: The patient is alert and oriented x 3.  SKIN: No obvious rash, lesion, or ulcer.   DATA REVIEW:   CBC  Recent Labs Lab 02/22/16 1641  WBC 9.0  HGB 10.2*  HCT 29.9*  PLT 124*    Chemistries   Recent Labs Lab 02/23/16 0418  NA 141  K 4.3  CL 98*  CO2 30  GLUCOSE 199*  BUN 42*  CREATININE 10.11*  CALCIUM 8.8*    Cardiac Enzymes  Recent Labs Lab 02/22/16 1641 02/22/16 2316 02/23/16 0418  TROPONINI 0.03* 0.03* 0.03*    Microbiology Results  @MICRORSLT48 @  RADIOLOGY:  Dg Chest 2 View  Result Date: 02/22/2016 CLINICAL DATA:  Shortness of breath, nausea and vomiting today. Symptoms began prior to dialysis this  morning. EXAM: CHEST  2 VIEW COMPARISON:  PA and lateral chest 12/30/2015 and 05/02/2015. FINDINGS: There is cardiomegaly without edema. Patchy bibasilar airspace disease is identified, greater on the right. No pneumothorax. Likely small bilateral pleural effusions are seen. Aortic atherosclerosis is noted. IMPRESSION: Bibasilar airspace disease is worse on the right and could be due to atelectasis or infection. Very small bilateral pleural effusions. Cardiomegaly. Atherosclerosis. Electronically Signed   By: Inge Rise M.D.   On: 02/22/2016 16:14      Management plans discussed with the patient and she is in agreement. Stable for discharge   Patient should follow up with pcp  CODE STATUS:     Code Status Orders        Start     Ordered   02/22/16 1901  Full code  Continuous     02/22/16 1902    Code Status History    Date Active Date Inactive Code Status Order ID Comments User Context   05/07/2015  7:43 PM 05/08/2015  7:08 PM Full Code [id number]  Bettey Costa, MD Inpatient   05/02/2015  4:48 PM 05/04/2015  8:14 PM Full Code [id number]  Vaughan Basta, MD Inpatient   05/02/2015  3:31 PM 05/02/2015  4:48 PM Full Code [id number]  [name], MD Inpatient   11/23/2014 10:42 PM 11/25/2014  3:29 PM Full Code [id number]  Demetrios Loll, MD Inpatient      TOTAL TIME TAKING CARE OF THIS PATIENT: 36 minutes.    Note: This dictation was prepared with Dragon dictation along with smaller phrase technology. Any transcriptional errors that result from this process are unintentional.  Matraca Hunkins M.D on 02/23/2016 at 11:44 AM  Between 7am to 6pm - Pager - [phone] After 6pm go to [email or web address] - Proofreader  [facility]  Office  [phone]  CC: Primary care physician; Mable Paris, FNP

## 2016-02-23 NOTE — Progress Notes (Signed)
Patient is admitted to room 259 with he diagnosis of chest pain. Alert and oriented x 4. Denied any acute pain, no respiratory distress noted.  Tele box called to CCMD with Cristopher Peru RN as a second verifier. Skin assessment done with Lattie Haw RN as well, no skin issues of concern but dry and flaky. Password was set up. Bed alarm activated an the bed is in the lowest position. Will continue to monitor.

## 2016-02-23 NOTE — Progress Notes (Signed)
Patient's BP this AM is 187/62, Dr. Estanislado Pandy notified with a new order for Hydralazine 10 IV as needed for BP>150/90. Will continue to monitor.

## 2016-02-23 NOTE — Progress Notes (Signed)
Cardio MD was consulted on ok to go home. Ok to d/c pt home per MD Farrel Conners. No HD scheduled for today.

## 2016-02-25 DIAGNOSIS — D631 Anemia in chronic kidney disease: Secondary | ICD-10-CM | POA: Diagnosis not present

## 2016-02-25 DIAGNOSIS — Z23 Encounter for immunization: Secondary | ICD-10-CM | POA: Diagnosis not present

## 2016-02-25 DIAGNOSIS — N2581 Secondary hyperparathyroidism of renal origin: Secondary | ICD-10-CM | POA: Diagnosis not present

## 2016-02-25 DIAGNOSIS — N186 End stage renal disease: Secondary | ICD-10-CM | POA: Diagnosis not present

## 2016-02-25 DIAGNOSIS — Z992 Dependence on renal dialysis: Secondary | ICD-10-CM | POA: Diagnosis not present

## 2016-02-25 DIAGNOSIS — D509 Iron deficiency anemia, unspecified: Secondary | ICD-10-CM | POA: Diagnosis not present

## 2016-02-26 ENCOUNTER — Ambulatory Visit: Payer: Medicare Other

## 2016-02-26 ENCOUNTER — Ambulatory Visit (INDEPENDENT_AMBULATORY_CARE_PROVIDER_SITE_OTHER): Payer: Medicare Other

## 2016-02-26 ENCOUNTER — Other Ambulatory Visit: Payer: Self-pay

## 2016-02-26 DIAGNOSIS — R0989 Other specified symptoms and signs involving the circulatory and respiratory systems: Secondary | ICD-10-CM

## 2016-02-26 DIAGNOSIS — I340 Nonrheumatic mitral (valve) insufficiency: Secondary | ICD-10-CM

## 2016-02-26 DIAGNOSIS — I6523 Occlusion and stenosis of bilateral carotid arteries: Secondary | ICD-10-CM | POA: Diagnosis not present

## 2016-02-27 DIAGNOSIS — N186 End stage renal disease: Secondary | ICD-10-CM | POA: Diagnosis not present

## 2016-02-27 DIAGNOSIS — Z23 Encounter for immunization: Secondary | ICD-10-CM | POA: Diagnosis not present

## 2016-02-27 DIAGNOSIS — D631 Anemia in chronic kidney disease: Secondary | ICD-10-CM | POA: Diagnosis not present

## 2016-02-27 DIAGNOSIS — N2581 Secondary hyperparathyroidism of renal origin: Secondary | ICD-10-CM | POA: Diagnosis not present

## 2016-02-27 DIAGNOSIS — Z992 Dependence on renal dialysis: Secondary | ICD-10-CM | POA: Diagnosis not present

## 2016-02-27 DIAGNOSIS — D509 Iron deficiency anemia, unspecified: Secondary | ICD-10-CM | POA: Diagnosis not present

## 2016-02-27 LAB — CULTURE, BLOOD (ROUTINE X 2)
CULTURE: NO GROWTH
Culture: NO GROWTH

## 2016-02-29 DIAGNOSIS — N2581 Secondary hyperparathyroidism of renal origin: Secondary | ICD-10-CM | POA: Diagnosis not present

## 2016-02-29 DIAGNOSIS — Z23 Encounter for immunization: Secondary | ICD-10-CM | POA: Diagnosis not present

## 2016-02-29 DIAGNOSIS — Z992 Dependence on renal dialysis: Secondary | ICD-10-CM | POA: Diagnosis not present

## 2016-02-29 DIAGNOSIS — D509 Iron deficiency anemia, unspecified: Secondary | ICD-10-CM | POA: Diagnosis not present

## 2016-02-29 DIAGNOSIS — D631 Anemia in chronic kidney disease: Secondary | ICD-10-CM | POA: Diagnosis not present

## 2016-02-29 DIAGNOSIS — N186 End stage renal disease: Secondary | ICD-10-CM | POA: Diagnosis not present

## 2016-03-03 DIAGNOSIS — Z992 Dependence on renal dialysis: Secondary | ICD-10-CM | POA: Diagnosis not present

## 2016-03-03 DIAGNOSIS — N2581 Secondary hyperparathyroidism of renal origin: Secondary | ICD-10-CM | POA: Diagnosis not present

## 2016-03-03 DIAGNOSIS — N186 End stage renal disease: Secondary | ICD-10-CM | POA: Diagnosis not present

## 2016-03-03 DIAGNOSIS — Z23 Encounter for immunization: Secondary | ICD-10-CM | POA: Diagnosis not present

## 2016-03-03 DIAGNOSIS — D509 Iron deficiency anemia, unspecified: Secondary | ICD-10-CM | POA: Diagnosis not present

## 2016-03-03 DIAGNOSIS — D631 Anemia in chronic kidney disease: Secondary | ICD-10-CM | POA: Diagnosis not present

## 2016-03-05 DIAGNOSIS — Z23 Encounter for immunization: Secondary | ICD-10-CM | POA: Diagnosis not present

## 2016-03-05 DIAGNOSIS — D509 Iron deficiency anemia, unspecified: Secondary | ICD-10-CM | POA: Diagnosis not present

## 2016-03-05 DIAGNOSIS — D631 Anemia in chronic kidney disease: Secondary | ICD-10-CM | POA: Diagnosis not present

## 2016-03-05 DIAGNOSIS — N2581 Secondary hyperparathyroidism of renal origin: Secondary | ICD-10-CM | POA: Diagnosis not present

## 2016-03-05 DIAGNOSIS — N186 End stage renal disease: Secondary | ICD-10-CM | POA: Diagnosis not present

## 2016-03-05 DIAGNOSIS — Z992 Dependence on renal dialysis: Secondary | ICD-10-CM | POA: Diagnosis not present

## 2016-03-07 DIAGNOSIS — D631 Anemia in chronic kidney disease: Secondary | ICD-10-CM | POA: Diagnosis not present

## 2016-03-07 DIAGNOSIS — D509 Iron deficiency anemia, unspecified: Secondary | ICD-10-CM | POA: Diagnosis not present

## 2016-03-07 DIAGNOSIS — Z992 Dependence on renal dialysis: Secondary | ICD-10-CM | POA: Diagnosis not present

## 2016-03-07 DIAGNOSIS — N186 End stage renal disease: Secondary | ICD-10-CM | POA: Diagnosis not present

## 2016-03-07 DIAGNOSIS — N2581 Secondary hyperparathyroidism of renal origin: Secondary | ICD-10-CM | POA: Diagnosis not present

## 2016-03-07 DIAGNOSIS — Z23 Encounter for immunization: Secondary | ICD-10-CM | POA: Diagnosis not present

## 2016-03-08 DIAGNOSIS — Z992 Dependence on renal dialysis: Secondary | ICD-10-CM | POA: Diagnosis not present

## 2016-03-08 DIAGNOSIS — N186 End stage renal disease: Secondary | ICD-10-CM | POA: Diagnosis not present

## 2016-03-10 DIAGNOSIS — N186 End stage renal disease: Secondary | ICD-10-CM | POA: Diagnosis not present

## 2016-03-10 DIAGNOSIS — N2581 Secondary hyperparathyroidism of renal origin: Secondary | ICD-10-CM | POA: Diagnosis not present

## 2016-03-10 DIAGNOSIS — D631 Anemia in chronic kidney disease: Secondary | ICD-10-CM | POA: Diagnosis not present

## 2016-03-10 DIAGNOSIS — D509 Iron deficiency anemia, unspecified: Secondary | ICD-10-CM | POA: Diagnosis not present

## 2016-03-10 DIAGNOSIS — Z992 Dependence on renal dialysis: Secondary | ICD-10-CM | POA: Diagnosis not present

## 2016-03-11 ENCOUNTER — Ambulatory Visit: Payer: Medicare Other | Admitting: Family

## 2016-03-11 DIAGNOSIS — Z0289 Encounter for other administrative examinations: Secondary | ICD-10-CM

## 2016-03-12 DIAGNOSIS — Z992 Dependence on renal dialysis: Secondary | ICD-10-CM | POA: Diagnosis not present

## 2016-03-12 DIAGNOSIS — N2581 Secondary hyperparathyroidism of renal origin: Secondary | ICD-10-CM | POA: Diagnosis not present

## 2016-03-12 DIAGNOSIS — D509 Iron deficiency anemia, unspecified: Secondary | ICD-10-CM | POA: Diagnosis not present

## 2016-03-12 DIAGNOSIS — D631 Anemia in chronic kidney disease: Secondary | ICD-10-CM | POA: Diagnosis not present

## 2016-03-12 DIAGNOSIS — N186 End stage renal disease: Secondary | ICD-10-CM | POA: Diagnosis not present

## 2016-03-13 DIAGNOSIS — M5416 Radiculopathy, lumbar region: Secondary | ICD-10-CM | POA: Diagnosis not present

## 2016-03-13 DIAGNOSIS — M5136 Other intervertebral disc degeneration, lumbar region: Secondary | ICD-10-CM | POA: Diagnosis not present

## 2016-03-14 DIAGNOSIS — D509 Iron deficiency anemia, unspecified: Secondary | ICD-10-CM | POA: Diagnosis not present

## 2016-03-14 DIAGNOSIS — N186 End stage renal disease: Secondary | ICD-10-CM | POA: Diagnosis not present

## 2016-03-14 DIAGNOSIS — D631 Anemia in chronic kidney disease: Secondary | ICD-10-CM | POA: Diagnosis not present

## 2016-03-14 DIAGNOSIS — N2581 Secondary hyperparathyroidism of renal origin: Secondary | ICD-10-CM | POA: Diagnosis not present

## 2016-03-14 DIAGNOSIS — Z992 Dependence on renal dialysis: Secondary | ICD-10-CM | POA: Diagnosis not present

## 2016-03-17 DIAGNOSIS — Z992 Dependence on renal dialysis: Secondary | ICD-10-CM | POA: Diagnosis not present

## 2016-03-17 DIAGNOSIS — D509 Iron deficiency anemia, unspecified: Secondary | ICD-10-CM | POA: Diagnosis not present

## 2016-03-17 DIAGNOSIS — N186 End stage renal disease: Secondary | ICD-10-CM | POA: Diagnosis not present

## 2016-03-17 DIAGNOSIS — N2581 Secondary hyperparathyroidism of renal origin: Secondary | ICD-10-CM | POA: Diagnosis not present

## 2016-03-17 DIAGNOSIS — D631 Anemia in chronic kidney disease: Secondary | ICD-10-CM | POA: Diagnosis not present

## 2016-03-18 DIAGNOSIS — I2510 Atherosclerotic heart disease of native coronary artery without angina pectoris: Secondary | ICD-10-CM | POA: Diagnosis not present

## 2016-03-18 DIAGNOSIS — Z01810 Encounter for preprocedural cardiovascular examination: Secondary | ICD-10-CM | POA: Diagnosis not present

## 2016-03-18 DIAGNOSIS — N271 Small kidney, bilateral: Secondary | ICD-10-CM | POA: Diagnosis not present

## 2016-03-18 DIAGNOSIS — E1122 Type 2 diabetes mellitus with diabetic chronic kidney disease: Secondary | ICD-10-CM | POA: Diagnosis not present

## 2016-03-19 DIAGNOSIS — N2581 Secondary hyperparathyroidism of renal origin: Secondary | ICD-10-CM | POA: Diagnosis not present

## 2016-03-19 DIAGNOSIS — D509 Iron deficiency anemia, unspecified: Secondary | ICD-10-CM | POA: Diagnosis not present

## 2016-03-19 DIAGNOSIS — Z992 Dependence on renal dialysis: Secondary | ICD-10-CM | POA: Diagnosis not present

## 2016-03-19 DIAGNOSIS — N186 End stage renal disease: Secondary | ICD-10-CM | POA: Diagnosis not present

## 2016-03-19 DIAGNOSIS — D631 Anemia in chronic kidney disease: Secondary | ICD-10-CM | POA: Diagnosis not present

## 2016-03-21 DIAGNOSIS — Z992 Dependence on renal dialysis: Secondary | ICD-10-CM | POA: Diagnosis not present

## 2016-03-21 DIAGNOSIS — D631 Anemia in chronic kidney disease: Secondary | ICD-10-CM | POA: Diagnosis not present

## 2016-03-21 DIAGNOSIS — D509 Iron deficiency anemia, unspecified: Secondary | ICD-10-CM | POA: Diagnosis not present

## 2016-03-21 DIAGNOSIS — N2581 Secondary hyperparathyroidism of renal origin: Secondary | ICD-10-CM | POA: Diagnosis not present

## 2016-03-21 DIAGNOSIS — N186 End stage renal disease: Secondary | ICD-10-CM | POA: Diagnosis not present

## 2016-03-24 ENCOUNTER — Ambulatory Visit: Payer: Medicare Other | Attending: Family | Admitting: Family

## 2016-03-24 ENCOUNTER — Encounter: Payer: Self-pay | Admitting: Family

## 2016-03-24 VITALS — BP 157/59 | HR 69 | Resp 18 | Ht 67.0 in | Wt 174.0 lb

## 2016-03-24 DIAGNOSIS — I252 Old myocardial infarction: Secondary | ICD-10-CM | POA: Diagnosis not present

## 2016-03-24 DIAGNOSIS — E11319 Type 2 diabetes mellitus with unspecified diabetic retinopathy without macular edema: Secondary | ICD-10-CM | POA: Insufficient documentation

## 2016-03-24 DIAGNOSIS — Z992 Dependence on renal dialysis: Secondary | ICD-10-CM | POA: Insufficient documentation

## 2016-03-24 DIAGNOSIS — E1140 Type 2 diabetes mellitus with diabetic neuropathy, unspecified: Secondary | ICD-10-CM | POA: Diagnosis not present

## 2016-03-24 DIAGNOSIS — Z833 Family history of diabetes mellitus: Secondary | ICD-10-CM | POA: Insufficient documentation

## 2016-03-24 DIAGNOSIS — Z955 Presence of coronary angioplasty implant and graft: Secondary | ICD-10-CM | POA: Insufficient documentation

## 2016-03-24 DIAGNOSIS — Z86010 Personal history of colonic polyps: Secondary | ICD-10-CM | POA: Diagnosis not present

## 2016-03-24 DIAGNOSIS — I2510 Atherosclerotic heart disease of native coronary artery without angina pectoris: Secondary | ICD-10-CM | POA: Diagnosis not present

## 2016-03-24 DIAGNOSIS — Z87891 Personal history of nicotine dependence: Secondary | ICD-10-CM | POA: Insufficient documentation

## 2016-03-24 DIAGNOSIS — Z8249 Family history of ischemic heart disease and other diseases of the circulatory system: Secondary | ICD-10-CM | POA: Insufficient documentation

## 2016-03-24 DIAGNOSIS — E1122 Type 2 diabetes mellitus with diabetic chronic kidney disease: Secondary | ICD-10-CM | POA: Diagnosis not present

## 2016-03-24 DIAGNOSIS — I132 Hypertensive heart and chronic kidney disease with heart failure and with stage 5 chronic kidney disease, or end stage renal disease: Secondary | ICD-10-CM | POA: Insufficient documentation

## 2016-03-24 DIAGNOSIS — D631 Anemia in chronic kidney disease: Secondary | ICD-10-CM | POA: Diagnosis not present

## 2016-03-24 DIAGNOSIS — Z888 Allergy status to other drugs, medicaments and biological substances status: Secondary | ICD-10-CM | POA: Diagnosis not present

## 2016-03-24 DIAGNOSIS — I739 Peripheral vascular disease, unspecified: Secondary | ICD-10-CM | POA: Insufficient documentation

## 2016-03-24 DIAGNOSIS — Z800 Family history of malignant neoplasm of digestive organs: Secondary | ICD-10-CM | POA: Diagnosis not present

## 2016-03-24 DIAGNOSIS — I6523 Occlusion and stenosis of bilateral carotid arteries: Secondary | ICD-10-CM | POA: Diagnosis not present

## 2016-03-24 DIAGNOSIS — Z885 Allergy status to narcotic agent status: Secondary | ICD-10-CM | POA: Diagnosis not present

## 2016-03-24 DIAGNOSIS — J449 Chronic obstructive pulmonary disease, unspecified: Secondary | ICD-10-CM | POA: Diagnosis not present

## 2016-03-24 DIAGNOSIS — Z90710 Acquired absence of both cervix and uterus: Secondary | ICD-10-CM | POA: Insufficient documentation

## 2016-03-24 DIAGNOSIS — Z803 Family history of malignant neoplasm of breast: Secondary | ICD-10-CM | POA: Insufficient documentation

## 2016-03-24 DIAGNOSIS — I5032 Chronic diastolic (congestive) heart failure: Secondary | ICD-10-CM

## 2016-03-24 DIAGNOSIS — N186 End stage renal disease: Secondary | ICD-10-CM | POA: Diagnosis not present

## 2016-03-24 DIAGNOSIS — N2581 Secondary hyperparathyroidism of renal origin: Secondary | ICD-10-CM | POA: Diagnosis not present

## 2016-03-24 DIAGNOSIS — E785 Hyperlipidemia, unspecified: Secondary | ICD-10-CM | POA: Insufficient documentation

## 2016-03-24 DIAGNOSIS — D573 Sickle-cell trait: Secondary | ICD-10-CM | POA: Diagnosis not present

## 2016-03-24 DIAGNOSIS — Z7982 Long term (current) use of aspirin: Secondary | ICD-10-CM | POA: Insufficient documentation

## 2016-03-24 DIAGNOSIS — Z823 Family history of stroke: Secondary | ICD-10-CM | POA: Diagnosis not present

## 2016-03-24 DIAGNOSIS — I10 Essential (primary) hypertension: Secondary | ICD-10-CM

## 2016-03-24 DIAGNOSIS — D509 Iron deficiency anemia, unspecified: Secondary | ICD-10-CM | POA: Diagnosis not present

## 2016-03-24 MED ORDER — RENA-VITE PO TABS: 1.0000 | ORAL_TABLET | Freq: Every day | ORAL | 5 refills | Status: DC

## 2016-03-24 MED ORDER — LOSARTAN POTASSIUM 50 MG PO TABS: 50.0000 mg | ORAL_TABLET | Freq: Every day | ORAL | 3 refills | Status: DC

## 2016-03-24 NOTE — Progress Notes (Signed)
Patient ID: Theresa [name], female    DOB: Jun 26, 1955, 60 y.o.   MRN: [id number]  HPI  Theresa [name] is a 60 y/o female with a history of PVD, HTN, hyperlipidemia, COPD, CAD with NSTEMI, anemia, previous tobacco use, DM, ESRD on dialysis and chronic heart failure.  Last echo was done 02/26/16 which showed an EF of 55-60%, mild AS and moderate/severe MR which is worse than previous echo done on 11/24/14. Right-sided pressure elevated at 50 mm Hg.   Last admitted to [facility] on 02/22/16 with chest pain. Troponins were negative X 3. Cardiology consult was done. Was also diagnosed with pneumonia and treated with levaquin. Losartan was started and metoprolol increased due to HTN.  Patient presents today for a follow-up visit without any complaints of fatigue or shortness of breath. No swelling in her ankles/abdomen. Weighing daily with slight weight loss. Continues with dialysis on M, W, F. Unable to tolerate wearing her CPAP due to claustrophobia sensation. Does wear oxygen at 2L as needed upon exertion.   Past Medical History:  Diagnosis Date  . Anemia of chronic disease   . Carotid arterial disease ([place])    a. 02/2013 U/S: 40-59% bilat ICA stenosis - *f/u 02/2014*  . Chronic constipation   . Chronic diastolic CHF (congestive heart failure) ([place])    a. 10/2013 Echo [facility]): EF 55-60%, mod conc LVH, mod MR, mildly dil LA, mild Ao sclerosis w/o stenosis.  . Colon polyps   . COPD (chronic obstructive pulmonary disease) ([place])   . Coronary artery disease    a. 05/2013 NSTEMI/PCI: LM 20d, LAD min irregs, LCX small, nl, OM1 nl, RCA dom 32m (2.5x16 Promus DES), PDA1 80p.  . Diabetes mellitus   . Diabetic neuropathy ([place])   . Diabetic retinopathy ([place]) 05/28/2013   Hx bilat retinal detachment, proliferative diab retinopathy and bilat vitreous hemorrhage   . Emphysema   . ESRD on hemodialysis ([place])    a. [facility] in [place], [place], on a TTS schedule.  She started dialysis in Feb 2014.  Etiology of renal failure not  known, likely diabetes.  Has a left upper arm AV graft.  . History of bronchitis    Mar 2012  . History of pneumonia    June 2012  . History of tobacco abuse    a. Quit 2012.  Marland Kitchen Hyperlipidemia   . Hypertension   . Moderate mitral insufficiency    a. 10/2013 Echo: EF 55-60%, mod MR.  . Peripheral vascular disease ([place])   . Sickle cell trait [facility])     Past Surgical History:  Procedure Laterality Date  . ABDOMINAL HYSTERECTOMY     2000  . CARDIAC CATHETERIZATION    . CARDIAC CATHETERIZATION N/A 05/02/2015   Procedure: Left Heart Cath and Coronary Angiography;  Surgeon: [name], MD;  Location: [place] CV LAB;  Service: Cardiovascular;  Laterality: N/A;  . colonscopy    . CORONARY ANGIOPLASTY  05/28/2014   stent placement to the mid RCA  . DILATION AND CURETTAGE OF UTERUS     several in the early 80's  . ESOPHAGOGASTRODUODENOSCOPY     2012  . EYE SURGERY     bilateral laser 2012  . EYE SURGERY     right  . GAS INSERTION  09/30/2011   Procedure: INSERTION OF GAS;  Surgeon: [name], MD;  Location: [place];  Service: Ophthalmology;  Laterality: Right;  C3F8  . GAS/FLUID EXCHANGE  09/30/2011   Procedure: GAS/FLUID EXCHANGE;  Surgeon:  [name], MD;  Location: [place];  Service: Ophthalmology;  Laterality: Right;  . LEFT HEART CATHETERIZATION WITH CORONARY ANGIOGRAM N/A 05/28/2013   Procedure: LEFT HEART CATHETERIZATION WITH CORONARY ANGIOGRAM;  Surgeon: [name], MD;  Location[facility] CATH LAB;  Service: Cardiovascular;  Laterality: N/A;  . PARS PLANA VITRECTOMY  04/22/2011   Procedure: PARS PLANA VITRECTOMY WITH 25 GAUGE;  Surgeon: [name], MD;  Location: [place];  Service: Ophthalmology;  Laterality: Left;  membrane peel, endolaser, gas fluid exchange, silicone oil, repair of complex traction retinal detachment  . PARS PLANA VITRECTOMY  09/30/2011   Procedure: PARS PLANA VITRECTOMY WITH 25 GAUGE;  Surgeon: [name], MD;  Location: [place];   Service: Ophthalmology;  Laterality: Right;  Endolaser; Repair of Complex Traction Retinal Detachment  . PARS PLANA VITRECTOMY  02/24/2012   Procedure: PARS PLANA VITRECTOMY WITH 25 GAUGE;  Surgeon: [name], MD;  Location: [place];  Service: Ophthalmology;  Laterality: Left;  . PTCA    . SILICON OIL REMOVAL  9/62/8366   Procedure: SILICON OIL REMOVAL;  Surgeon: [name], MD;  Location: [place];  Service: Ophthalmology;  Laterality: Left;  . THROMBECTOMY / ARTERIOVENOUS GRAFT REVISION    . TUBAL LIGATION     1979    Family History  Problem Relation Age of Onset  . Stroke Mother   . Heart attack Mother   . Heart disease Mother   . Colon cancer Father   . Colon cancer Sister   . Heart attack Brother   . Stroke Brother   . Diabetes Brother   . Breast cancer Sister   . Diabetes Brother   . Diabetes Daughter   . Anesthesia problems Neg Hx   . Hypotension Neg Hx   . Malignant hyperthermia Neg Hx   . Pseudochol deficiency Neg Hx     Social History  Substance Use Topics  . Smoking status: Former Smoker    Packs/day: 1.00    Years: 25.00    Quit date: 06/09/2010  . Smokeless tobacco: Never Used  . Alcohol use No    Allergies  Allergen Reactions  . Hydrocodone Hives  . Metformin Diarrhea    Prior to Admission medications   Medication Sig Start Date End Date Taking? Authorizing Provider  albuterol (PROVENTIL HFA;VENTOLIN HFA) 108 (90 BASE) MCG/ACT inhaler Inhale 2 puffs into the lungs every 6 (six) hours as needed for wheezing or shortness of breath.   Yes Historical Provider, MD  albuterol (PROVENTIL) (2.5 MG/3ML) 0.083% nebulizer solution Take 2.5 mg by nebulization every 4 (four) hours as needed for wheezing or shortness of breath.   Yes Historical Provider, MD  aspirin EC 81 MG tablet Take 81 mg by mouth daily.   Yes Historical Provider, MD  calcium acetate (PHOSLO) 667 MG capsule Take [phone] mg by mouth 3 (three) times daily with meals. Take 5 capsules if you  have anything with cheese, otherwise, take 4 capsules if you don't have cheese.   Yes Historical Provider, MD  carvedilol (COREG) 25 MG tablet Take 1 tablet (25 mg total) by mouth 2 (two) times daily with a meal. 02/23/16  Yes Sital Mody, MD  cloNIDine (CATAPRES) 0.1 MG tablet Take 1 tablet (0.1 mg total) by mouth 2 (two) times daily. 01/30/15  Yes Minna Merritts, MD  CRESTOR 40 MG tablet TAKE ONE TABLET BY MOUTH ONCE DAILY 08/09/15  Yes Minna Merritts, MD  furosemide (LASIX) 80 MG tablet Take  80 mg by mouth daily. Only on Saturdays, Sundays, Tuesdays and Thursdays. (non-dialysis days)   Yes Historical Provider, MD  losartan (COZAAR) 50 MG tablet Take 1 tablet (50 mg total) by mouth daily. 03/24/16  Yes Alisa Graff, FNP  Melatonin 1 MG CAPS Take 1 capsule by mouth daily.   Yes Historical Provider, MD  multivitamin (RENA-VIT) TABS tablet Take 1 tablet by mouth daily. 03/24/16  Yes Alisa Graff, FNP  nitroGLYCERIN (NITROSTAT) 0.4 MG SL tablet Place 1 tablet (0.4 mg total) under the tongue every 5 (five) minutes as needed for chest pain. 09/26/15  Yes Alisa Graff, FNP  pregabalin (LYRICA) 75 MG capsule Take 1 capsule (75 mg total) by mouth daily. 03/28/14  Yes Jackolyn Confer, MD     Review of Systems  Constitutional: Positive for appetite change. Negative for fatigue.  HENT: Positive for congestion. Negative for postnasal drip and sore throat.   Eyes: Negative.   Respiratory: Negative for chest tightness and shortness of breath.   Cardiovascular: Positive for palpitations (at times). Negative for chest pain and leg swelling.  Gastrointestinal: Negative for abdominal distention and abdominal pain.  Endocrine: Negative.   Genitourinary: Negative.   Musculoskeletal: Positive for back pain. Negative for neck pain.  Skin: Negative.   Allergic/Immunologic: Negative.   Neurological: Negative for dizziness and light-headedness.  Hematological: Negative for adenopathy. Does not bruise/bleed  easily.  Psychiatric/Behavioral: Negative for dysphoric mood and sleep disturbance (sleeping on 1 pillow; oxgen at 2L PRN). The patient is not nervous/anxious.     Vitals:   03/24/16 1018  BP: (!) 157/59  Pulse: 69  Resp: 18  SpO2: 94%  Weight: 174 lb (78.9 kg)  Height: 5\' 7"  (1.702 m)     Physical Exam  Constitutional: She is oriented to person, place, and time. She appears well-developed and well-nourished.  HENT:  Head: Normocephalic and atraumatic.  Eyes: Conjunctivae are normal. Pupils are equal, round, and reactive to light.  Neck: Normal range of motion. Neck supple.  Cardiovascular: Normal rate and regular rhythm.   Pulmonary/Chest: Effort normal. She has no wheezes. She has no rales.  Abdominal: Soft. She exhibits no distension. There is no tenderness.  Musculoskeletal: She exhibits no edema or tenderness.  Neurological: She is alert and oriented to person, place, and time.  Skin: Skin is warm and dry.  Psychiatric: She has a normal mood and affect. Her behavior is normal. Thought content normal.  Nursing note and vitals reviewed.  Assessment & Plan:  1: Chronic heart failure with preserved ejection fraction- - NYHA class I today - euvolemic  - continue daily weights and call for an overnight weight gain of >2 pounds/weekly weight gain of >5 pounds. Weight down 4 pounds from last visit of 12/26/15 - already received flu vaccine for this season - Last saw cardiology 02/05/16  2: HTN- - BP mildly elevated today - Continue medications at this time  3: COPD- - Unable to tolerate CPAP - oxygen at 2L as needed  4: ESRD on hemodialysis- - dialysis on M, W, F - denies difficulty with dialysis  Return in 3 months or sooner for any questions/problems before then.

## 2016-03-24 NOTE — Patient Instructions (Signed)
Continue weighing daily and call for an overnight weight gain of > 2 pounds or a weekly weight gain of >5 pounds. 

## 2016-03-25 ENCOUNTER — Ambulatory Visit (INDEPENDENT_AMBULATORY_CARE_PROVIDER_SITE_OTHER): Payer: Medicare Other | Admitting: Podiatry

## 2016-03-25 DIAGNOSIS — M65971 Unspecified synovitis and tenosynovitis, right ankle and foot: Secondary | ICD-10-CM

## 2016-03-25 DIAGNOSIS — E118 Type 2 diabetes mellitus with unspecified complications: Secondary | ICD-10-CM | POA: Diagnosis not present

## 2016-03-25 DIAGNOSIS — M7751 Other enthesopathy of right foot: Secondary | ICD-10-CM | POA: Diagnosis not present

## 2016-03-25 DIAGNOSIS — M659 Synovitis and tenosynovitis, unspecified: Secondary | ICD-10-CM

## 2016-03-25 DIAGNOSIS — M79609 Pain in unspecified limb: Secondary | ICD-10-CM

## 2016-03-25 DIAGNOSIS — G8929 Other chronic pain: Secondary | ICD-10-CM | POA: Diagnosis not present

## 2016-03-25 DIAGNOSIS — M25471 Effusion, right ankle: Secondary | ICD-10-CM | POA: Diagnosis not present

## 2016-03-25 DIAGNOSIS — I2510 Atherosclerotic heart disease of native coronary artery without angina pectoris: Secondary | ICD-10-CM | POA: Diagnosis not present

## 2016-03-25 DIAGNOSIS — M25571 Pain in right ankle and joints of right foot: Secondary | ICD-10-CM

## 2016-03-25 DIAGNOSIS — B351 Tinea unguium: Secondary | ICD-10-CM

## 2016-03-25 NOTE — Patient Instructions (Signed)
Diabetes and Foot Care Diabetes may cause you to have problems because of poor blood supply (circulation) to your feet and legs. This may cause the skin on your feet to become thinner, break easier, and heal more slowly. Your skin may become dry, and the skin may peel and crack. You may also have nerve damage in your legs and feet causing decreased feeling in them. You may not notice minor injuries to your feet that could lead to infections or more serious problems. Taking care of your feet is one of the most important things you can do for yourself.  HOME CARE INSTRUCTIONS  Wear shoes at all times, even in the house. Do not go barefoot. Bare feet are easily injured.  Check your feet daily for blisters, cuts, and redness. If you cannot see the bottom of your feet, use a mirror or ask someone for help.  Wash your feet with warm water (do not use hot water) and mild soap. Then pat your feet and the areas between your toes until they are completely dry. Do not soak your feet as this can dry your skin.  Apply a moisturizing lotion or petroleum jelly (that does not contain alcohol and is unscented) to the skin on your feet and to dry, brittle toenails. Do not apply lotion between your toes.  Trim your toenails straight across. Do not dig under them or around the cuticle. File the edges of your nails with an emery board or nail file.  Do not cut corns or calluses or try to remove them with medicine.  Wear clean socks or stockings every day. Make sure they are not too tight. Do not wear knee-high stockings since they may decrease blood flow to your legs.  Wear shoes that fit properly and have enough cushioning. To break in new shoes, wear them for just a few hours a day. This prevents you from injuring your feet. Always look in your shoes before you put them on to be sure there are no objects inside.  Do not cross your legs. This may decrease the blood flow to your feet.  If you find a minor scrape,  cut, or break in the skin on your feet, keep it and the skin around it clean and dry. These areas may be cleansed with mild soap and water. Do not cleanse the area with peroxide, alcohol, or iodine.  When you remove an adhesive bandage, be sure not to damage the skin around it.  If you have a wound, look at it several times a day to make sure it is healing.  Do not use heating pads or hot water bottles. They may burn your skin. If you have lost feeling in your feet or legs, you may not know it is happening until it is too late.  Make sure your health care provider performs a complete foot exam at least annually or more often if you have foot problems. Report any cuts, sores, or bruises to your health care provider immediately. SEEK MEDICAL CARE IF:   You have an injury that is not healing.  You have cuts or breaks in the skin.  You have an ingrown nail.  You notice redness on your legs or feet.  You feel burning or tingling in your legs or feet.  You have pain or cramps in your legs and feet.  Your legs or feet are numb.  Your feet always feel cold. SEEK IMMEDIATE MEDICAL CARE IF:   There is increasing redness,   swelling, or pain in or around a wound.  There is a red line that goes up your leg.  Pus is coming from a wound.  You develop a fever or as directed by your health care provider.  You notice a bad smell coming from an ulcer or wound.   This information is not intended to replace advice given to you by your health care provider. Make sure you discuss any questions you have with your health care provider.   Document Released: 05/23/2000 Document Revised: 01/26/2013 Document Reviewed: 11/02/2012 Elsevier Interactive Patient Education 2016 [employer]  

## 2016-03-26 DIAGNOSIS — D509 Iron deficiency anemia, unspecified: Secondary | ICD-10-CM | POA: Diagnosis not present

## 2016-03-26 DIAGNOSIS — N186 End stage renal disease: Secondary | ICD-10-CM | POA: Diagnosis not present

## 2016-03-26 DIAGNOSIS — N2581 Secondary hyperparathyroidism of renal origin: Secondary | ICD-10-CM | POA: Diagnosis not present

## 2016-03-26 DIAGNOSIS — Z992 Dependence on renal dialysis: Secondary | ICD-10-CM | POA: Diagnosis not present

## 2016-03-26 DIAGNOSIS — D631 Anemia in chronic kidney disease: Secondary | ICD-10-CM | POA: Diagnosis not present

## 2016-03-28 DIAGNOSIS — N2581 Secondary hyperparathyroidism of renal origin: Secondary | ICD-10-CM | POA: Diagnosis not present

## 2016-03-28 DIAGNOSIS — D631 Anemia in chronic kidney disease: Secondary | ICD-10-CM | POA: Diagnosis not present

## 2016-03-28 DIAGNOSIS — Z992 Dependence on renal dialysis: Secondary | ICD-10-CM | POA: Diagnosis not present

## 2016-03-28 DIAGNOSIS — D509 Iron deficiency anemia, unspecified: Secondary | ICD-10-CM | POA: Diagnosis not present

## 2016-03-28 DIAGNOSIS — N186 End stage renal disease: Secondary | ICD-10-CM | POA: Diagnosis not present

## 2016-03-30 MED ORDER — BETAMETHASONE SOD PHOS & ACET 6 (3-3) MG/ML IJ SUSP: 3.0000 mg | Freq: Once | INTRAMUSCULAR | Status: DC

## 2016-03-30 NOTE — Progress Notes (Signed)
SUBJECTIVE Patient with a history of diabetes mellitus presents to office today complaining of elongated, thickened nails. Pain while ambulating in shoes. Patient is unable to trim their own nails.  Patient also has a new complaint of right ankle pain. The patient states that it has hurt for the past several weeks. Patient presents today for further treatment and evaluation.   Allergies  Allergen Reactions  . Hydrocodone Hives  . Metformin Diarrhea    OBJECTIVE General Patient is awake, alert, and oriented x 3 and in no acute distress. Derm Skin is dry and supple bilateral. Negative open lesions or macerations. Remaining integument unremarkable. Nails are tender, long, thickened and dystrophic with subungual debris, consistent with onychomycosis, 1-5 bilateral. No signs of infection noted. Vasc  DP and PT pedal pulses palpable bilaterally. Temperature gradient within normal limits.  Neuro Epicritic and protective threshold sensation diminished bilaterally.  Musculoskeletal Exam Pain on palpation noted to the right ankle joint. Moderate edema noted. No symptomatic pedal deformities noted bilateral. Muscular strength within normal limits.  ASSESSMENT 1. Diabetes Mellitus w/ peripheral neuropathy 2. Onychomycosis of nail due to dermatophyte bilateral 3. Pain in foot bilateral 4. Pain in right ankle 5. Synovitis with capsulitis right ankle.  6. Edema right ankle.   PLAN OF CARE 1. Patient evaluated today. 2. Instructed to maintain good pedal hygiene and foot care. Stressed importance of controlling blood sugar.  3. Mechanical debridement of nails 1-5 bilaterally performed using a nail nipper. Filed with dremel without incident.  4. Injection of 0.51mL Celestone soluspan injected into the patient's right ankle joint.  5. Return to clinic in 3 mos.    Edrick Kins, DPM

## 2016-03-31 ENCOUNTER — Encounter: Payer: Self-pay | Admitting: Family

## 2016-03-31 ENCOUNTER — Ambulatory Visit (INDEPENDENT_AMBULATORY_CARE_PROVIDER_SITE_OTHER): Payer: Medicare Other | Admitting: Family

## 2016-03-31 DIAGNOSIS — N186 End stage renal disease: Secondary | ICD-10-CM

## 2016-03-31 DIAGNOSIS — I2510 Atherosclerotic heart disease of native coronary artery without angina pectoris: Secondary | ICD-10-CM | POA: Diagnosis not present

## 2016-03-31 DIAGNOSIS — Z794 Long term (current) use of insulin: Secondary | ICD-10-CM

## 2016-03-31 DIAGNOSIS — I10 Essential (primary) hypertension: Secondary | ICD-10-CM | POA: Diagnosis not present

## 2016-03-31 DIAGNOSIS — Z9989 Dependence on other enabling machines and devices: Secondary | ICD-10-CM

## 2016-03-31 DIAGNOSIS — J449 Chronic obstructive pulmonary disease, unspecified: Secondary | ICD-10-CM | POA: Diagnosis not present

## 2016-03-31 DIAGNOSIS — E1122 Type 2 diabetes mellitus with diabetic chronic kidney disease: Secondary | ICD-10-CM

## 2016-03-31 DIAGNOSIS — E11319 Type 2 diabetes mellitus with unspecified diabetic retinopathy without macular edema: Secondary | ICD-10-CM

## 2016-03-31 DIAGNOSIS — D509 Iron deficiency anemia, unspecified: Secondary | ICD-10-CM | POA: Diagnosis not present

## 2016-03-31 DIAGNOSIS — N2581 Secondary hyperparathyroidism of renal origin: Secondary | ICD-10-CM | POA: Diagnosis not present

## 2016-03-31 DIAGNOSIS — I5032 Chronic diastolic (congestive) heart failure: Secondary | ICD-10-CM

## 2016-03-31 DIAGNOSIS — D631 Anemia in chronic kidney disease: Secondary | ICD-10-CM | POA: Diagnosis not present

## 2016-03-31 DIAGNOSIS — M5137 Other intervertebral disc degeneration, lumbosacral region: Secondary | ICD-10-CM

## 2016-03-31 DIAGNOSIS — Z992 Dependence on renal dialysis: Secondary | ICD-10-CM | POA: Diagnosis not present

## 2016-03-31 DIAGNOSIS — G4733 Obstructive sleep apnea (adult) (pediatric): Secondary | ICD-10-CM

## 2016-03-31 NOTE — Progress Notes (Signed)
Sent to [employer]. They have not been able to contact her to schedule an appointment. I will reach out to pt to give her the number and if I am not able to reach her I will mail her a letter with the phone number to call.

## 2016-03-31 NOTE — Assessment & Plan Note (Signed)
No adventitious lung sounds or lower extremity swelling to suggest CHF unstable. Will continue to monitor.

## 2016-03-31 NOTE — Progress Notes (Signed)
Subjective:    Patient ID: Theresa [name], female    DOB: 01/22/1956, 60 y.o.   MRN: [id number]  CC: COMFORT IVERSEN is a 60 y.o. female who presents today for follow up.   HPI: Patient here for follow-up.  COPD- Hasn't seen pulmonology yet. Wears o2 at home. Not wearing in exam room.  No cough, SOB.   DM- Last A1c 5.8 per patient. Improved on ESRD  HTN/CHF- Has been taking medication.  Averages 150s/ 70s before HD and then it 'bottoms out' after HD. One week ago went to 99/ 46. Follows with cardiology. No no LE swelling. Denies exertional chest pain or pressure, numbness or tingling radiating to left arm or jaw, palpitations, dizziness, frequent headaches, changes in vision, or shortness of breath.   OSA- Not wearing. She never had it titrated.   ESRD- unable to see labs. Patient will ask them to fax today.   Not doing PT or using Tens unit. Follows with Dr. Sharlet Salina for low back pain.     HISTORY:  Past Medical History:  Diagnosis Date  . Anemia of chronic disease   . Carotid arterial disease ([place])    a. 02/2013 U/S: 40-59% bilat ICA stenosis - *f/u 02/2014*  . Chronic constipation   . Chronic diastolic CHF (congestive heart failure) ([place])    a. 10/2013 Echo [facility]): EF 55-60%, mod conc LVH, mod MR, mildly dil LA, mild Ao sclerosis w/o stenosis.  . Colon polyps   . COPD (chronic obstructive pulmonary disease) ([place])   . Coronary artery disease    a. 05/2013 NSTEMI/PCI: LM 20d, LAD min irregs, LCX small, nl, OM1 nl, RCA dom 30m (2.5x16 Promus DES), PDA1 80p.  . Diabetes mellitus   . Diabetic neuropathy ([place])   . Diabetic retinopathy ([place]) 05/28/2013   Hx bilat retinal detachment, proliferative diab retinopathy and bilat vitreous hemorrhage   . Emphysema   . ESRD on hemodialysis ([place])    a. [facility] in [place], [place], on a TTS schedule.  She started dialysis in Feb 2014.  Etiology of renal failure not known, likely diabetes.  Has a left upper arm AV graft.  . History of bronchitis      Mar 2012  . History of pneumonia    June 2012  . History of tobacco abuse    a. Quit 2012.  Marland Kitchen Hyperlipidemia   . Hypertension   . Moderate mitral insufficiency    a. 10/2013 Echo: EF 55-60%, mod MR.  . Peripheral vascular disease ([place])   . Sickle cell trait [facility])    Past Surgical History:  Procedure Laterality Date  . ABDOMINAL HYSTERECTOMY     2000  . CARDIAC CATHETERIZATION    . CARDIAC CATHETERIZATION N/A 05/02/2015   Procedure: Left Heart Cath and Coronary Angiography;  Surgeon: [name], MD;  Location: [place] CV LAB;  Service: Cardiovascular;  Laterality: N/A;  . colonscopy    . CORONARY ANGIOPLASTY  05/28/2014   stent placement to the mid RCA  . DILATION AND CURETTAGE OF UTERUS     several in the early 80's  . ESOPHAGOGASTRODUODENOSCOPY     2012  . EYE SURGERY     bilateral laser 2012  . EYE SURGERY     right  . GAS INSERTION  09/30/2011   Procedure: INSERTION OF GAS;  Surgeon: [name], MD;  Location: [place];  Service: Ophthalmology;  Laterality: Right;  C3F8  . GAS/FLUID EXCHANGE  09/30/2011   Procedure: GAS/FLUID  EXCHANGE;  Surgeon: [name], MD;  Location: [place];  Service: Ophthalmology;  Laterality: Right;  . LEFT HEART CATHETERIZATION WITH CORONARY ANGIOGRAM N/A 05/28/2013   Procedure: LEFT HEART CATHETERIZATION WITH CORONARY ANGIOGRAM;  Surgeon: [name], MD;  Location: [facility] CATH LAB;  Service: Cardiovascular;  Laterality: N/A;  . PARS PLANA VITRECTOMY  04/22/2011   Procedure: PARS PLANA VITRECTOMY WITH 25 GAUGE;  Surgeon: [name], MD;  Location: [place];  Service: Ophthalmology;  Laterality: Left;  membrane peel, endolaser, gas fluid exchange, silicone oil, repair of complex traction retinal detachment  . PARS PLANA VITRECTOMY  09/30/2011   Procedure: PARS PLANA VITRECTOMY WITH 25 GAUGE;  Surgeon: [name], MD;  Location: [place];  Service: Ophthalmology;  Laterality: Right;  Endolaser; Repair of Complex Traction  Retinal Detachment  . PARS PLANA VITRECTOMY  02/24/2012   Procedure: PARS PLANA VITRECTOMY WITH 25 GAUGE;  Surgeon: [name], MD;  Location: [place];  Service: Ophthalmology;  Laterality: Left;  . PTCA    . SILICON OIL REMOVAL  2/42/3536   Procedure: SILICON OIL REMOVAL;  Surgeon: [name], MD;  Location: [place];  Service: Ophthalmology;  Laterality: Left;  . THROMBECTOMY / ARTERIOVENOUS GRAFT REVISION    . TUBAL LIGATION     1979   Family History  Problem Relation Age of Onset  . Stroke Mother   . Heart attack Mother   . Heart disease Mother   . Colon cancer Father   . Colon cancer Sister   . Heart attack Brother   . Stroke Brother   . Diabetes Brother   . Breast cancer Sister   . Diabetes Brother   . Diabetes Daughter   . Anesthesia problems Neg Hx   . Hypotension Neg Hx   . Malignant hyperthermia Neg Hx   . Pseudochol deficiency Neg Hx     Allergies: Hydrocodone and Metformin Current Outpatient Prescriptions on File Prior to Visit  Medication Sig Dispense Refill  . albuterol (PROVENTIL HFA;VENTOLIN HFA) 108 (90 BASE) MCG/ACT inhaler Inhale 2 puffs into the lungs every 6 (six) hours as needed for wheezing or shortness of breath.    Marland Kitchen albuterol (PROVENTIL) (2.5 MG/3ML) 0.083% nebulizer solution Take 2.5 mg by nebulization every 4 (four) hours as needed for wheezing or shortness of breath.    Marland Kitchen aspirin EC 81 MG tablet Take 81 mg by mouth daily.    . calcium acetate (PHOSLO) 667 MG capsule Take [id number] mg by mouth 3 (three) times daily with meals. Take 5 capsules if you have anything with cheese, otherwise, take 4 capsules if you don't have cheese.    . carvedilol (COREG) 25 MG tablet Take 1 tablet (25 mg total) by mouth 2 (two) times daily with a meal. 60 tablet 0  . cloNIDine (CATAPRES) 0.1 MG tablet Take 1 tablet (0.1 mg total) by mouth 2 (two) times daily. 60 tablet 11  . CRESTOR 40 MG tablet TAKE ONE TABLET BY MOUTH ONCE DAILY 90 tablet 3  . furosemide (LASIX)  80 MG tablet Take 80 mg by mouth daily. Only on Saturdays, Sundays, Tuesdays and Thursdays. (non-dialysis days)    . losartan (COZAAR) 50 MG tablet Take 1 tablet (50 mg total) by mouth daily. 90 tablet 3  . Melatonin 1 MG CAPS Take 1 capsule by mouth daily.    . multivitamin (RENA-VIT) TABS tablet Take 1 tablet by mouth daily. 30 tablet 5  . nitroGLYCERIN (NITROSTAT) 0.4 MG SL tablet  Place 1 tablet (0.4 mg total) under the tongue every 5 (five) minutes as needed for chest pain. 25 tablet 3  . pregabalin (LYRICA) 75 MG capsule Take 1 capsule (75 mg total) by mouth daily. 30 capsule 3   Current Facility-Administered Medications on File Prior to Visit  Medication Dose Route Frequency Provider Last Rate Last Dose  . betamethasone acetate-betamethasone sodium phosphate (CELESTONE) injection 3 mg  3 mg Intramuscular Once Edrick Kins, DPM        Social History  Substance Use Topics  . Smoking status: Former Smoker    Packs/day: 1.00    Years: 25.00    Quit date: 06/09/2010  . Smokeless tobacco: Never Used  . Alcohol use No    Review of Systems  Constitutional: Negative for chills, fever and unexpected weight change.  Respiratory: Negative for cough, shortness of breath and wheezing.   Cardiovascular: Negative for chest pain, palpitations and leg swelling.  Gastrointestinal: Negative for nausea and vomiting.      Objective:    BP (!) 158/72   Pulse 70   Temp 98 F (36.7 C) (Oral)   Ht 5\' 7"  (1.702 m)   Wt 174 lb 12.8 oz (79.3 kg)   SpO2 98% Comment: with 2 liters of O2  BMI 27.38 kg/m  BP Readings from Last 3 Encounters:  03/31/16 (!) 158/72  03/24/16 (!) 157/59  02/23/16 (!) 185/54   Wt Readings from Last 3 Encounters:  03/31/16 174 lb 12.8 oz (79.3 kg)  03/24/16 174 lb (78.9 kg)  02/22/16 179 lb (81.2 kg)    Physical Exam  Constitutional: She appears well-developed and well-nourished.  Eyes: Conjunctivae are normal.  Cardiovascular: Normal rate, regular rhythm, normal  heart sounds and normal pulses.   No Le edema.   Pulmonary/Chest: Effort normal and breath sounds normal. She has no wheezes. She has no rhonchi. She has no rales.  Neurological: She is alert.  Skin: Skin is warm and dry.  Psychiatric: She has a normal mood and affect. Her speech is normal and behavior is normal. Thought content normal.  Vitals reviewed.      Assessment & Plan:   Problem List Items Addressed This Visit      Cardiovascular and Mediastinum   Essential (primary) hypertension (Chronic)    Elevated. Suspect related to fluid volume status; patient is has E Concern for increasing antihypertensive medication as patient experiences hypotension after dialysis. We'll continue to monitor.      Diastolic heart failure (HCC)    No adventitious lung sounds or lower extremity swelling to suggest CHF unstable. Will continue to monitor.        Respiratory   COPD (chronic obstructive pulmonary disease) (HCC) (Chronic)    SaO2 normal in office on 2L O2. Awaiting pulmonology consult. Asymptomatic, stable.       OSA on CPAP (Chronic)    Returned Cipap. Education provided regarding risks of untreated OSA and patient verbalized understanding of this. Will continue to encourage use of Cipap.         Endocrine   Diabetes mellitus, type II ([place]) (Chronic)    Presume controlled based on patient's last A1c 5.8. Awaiting patient to fax labs.       Diabetic retinopathy ([place])     Musculoskeletal and Integument   DDD (degenerative disc disease), lumbosacral    Stable. Follows with dr Sharlet Salina.        Other Visit Diagnoses   None.      I am having  Ms. Buda maintain her aspirin EC, pregabalin, albuterol, cloNIDine, albuterol, calcium acetate, CRESTOR, furosemide, nitroGLYCERIN, Melatonin, carvedilol, multivitamin, and losartan. We will continue to administer betamethasone acetate-betamethasone sodium phosphate.   No orders of the defined types were placed in this  encounter.   Return precautions given.   Risks, benefits, and alternatives of the medications and treatment plan prescribed today were discussed, and patient expressed understanding.   Education regarding symptom management and diagnosis given to patient on AVS.  Continue to follow with Mable Paris, FNP for routine health maintenance.   Theresa [name] and I agreed with plan.   Mable Paris, FNP

## 2016-03-31 NOTE — Assessment & Plan Note (Signed)
Stable. Follows with dr Sharlet Salina.

## 2016-03-31 NOTE — Assessment & Plan Note (Signed)
Returned [place]. Education provided regarding risks of untreated OSA and patient verbalized understanding of this. Will continue to encourage use of Cipap.

## 2016-03-31 NOTE — Assessment & Plan Note (Addendum)
Elevated. Suspect related to fluid volume status; patient is has E Concern for increasing antihypertensive medication as patient experiences hypotension after dialysis. We'll continue to monitor.

## 2016-03-31 NOTE — Assessment & Plan Note (Signed)
Presume controlled based on patient's last A1c 5.8. Awaiting patient to fax labs.

## 2016-03-31 NOTE — Assessment & Plan Note (Signed)
SaO2 normal in office on 2L O2. Awaiting pulmonology consult. Asymptomatic, stable.

## 2016-03-31 NOTE — Patient Instructions (Signed)
You look great.   Please please fax over your labs today. Including A1c.

## 2016-04-01 ENCOUNTER — Telehealth: Payer: Self-pay | Admitting: *Deleted

## 2016-04-01 NOTE — Telephone Encounter (Signed)
Spoken to patient, patient stated her BP was 134/64

## 2016-04-01 NOTE — Telephone Encounter (Signed)
Patient requested a returned call, pt stated to had a voicemail to call the office  Pt contact [phone]

## 2016-04-01 NOTE — Progress Notes (Signed)
Left message for patient to return call.

## 2016-04-01 NOTE — Progress Notes (Signed)
Patient stated that her BP was 134/64 today.

## 2016-04-02 DIAGNOSIS — N186 End stage renal disease: Secondary | ICD-10-CM | POA: Diagnosis not present

## 2016-04-02 DIAGNOSIS — D631 Anemia in chronic kidney disease: Secondary | ICD-10-CM | POA: Diagnosis not present

## 2016-04-02 DIAGNOSIS — Z992 Dependence on renal dialysis: Secondary | ICD-10-CM | POA: Diagnosis not present

## 2016-04-02 DIAGNOSIS — E11319 Type 2 diabetes mellitus with unspecified diabetic retinopathy without macular edema: Secondary | ICD-10-CM | POA: Diagnosis not present

## 2016-04-02 DIAGNOSIS — D509 Iron deficiency anemia, unspecified: Secondary | ICD-10-CM | POA: Diagnosis not present

## 2016-04-02 DIAGNOSIS — N2581 Secondary hyperparathyroidism of renal origin: Secondary | ICD-10-CM | POA: Diagnosis not present

## 2016-04-02 LAB — HM DIABETES EYE EXAM

## 2016-04-04 DIAGNOSIS — Z992 Dependence on renal dialysis: Secondary | ICD-10-CM | POA: Diagnosis not present

## 2016-04-04 DIAGNOSIS — D509 Iron deficiency anemia, unspecified: Secondary | ICD-10-CM | POA: Diagnosis not present

## 2016-04-04 DIAGNOSIS — N186 End stage renal disease: Secondary | ICD-10-CM | POA: Diagnosis not present

## 2016-04-04 DIAGNOSIS — D631 Anemia in chronic kidney disease: Secondary | ICD-10-CM | POA: Diagnosis not present

## 2016-04-04 DIAGNOSIS — N2581 Secondary hyperparathyroidism of renal origin: Secondary | ICD-10-CM | POA: Diagnosis not present

## 2016-04-07 DIAGNOSIS — N186 End stage renal disease: Secondary | ICD-10-CM | POA: Diagnosis not present

## 2016-04-07 DIAGNOSIS — Z992 Dependence on renal dialysis: Secondary | ICD-10-CM | POA: Diagnosis not present

## 2016-04-07 DIAGNOSIS — D631 Anemia in chronic kidney disease: Secondary | ICD-10-CM | POA: Diagnosis not present

## 2016-04-07 DIAGNOSIS — D509 Iron deficiency anemia, unspecified: Secondary | ICD-10-CM | POA: Diagnosis not present

## 2016-04-07 DIAGNOSIS — N2581 Secondary hyperparathyroidism of renal origin: Secondary | ICD-10-CM | POA: Diagnosis not present

## 2016-04-08 DIAGNOSIS — Z992 Dependence on renal dialysis: Secondary | ICD-10-CM | POA: Diagnosis not present

## 2016-04-08 DIAGNOSIS — N186 End stage renal disease: Secondary | ICD-10-CM | POA: Diagnosis not present

## 2016-04-09 DIAGNOSIS — N2581 Secondary hyperparathyroidism of renal origin: Secondary | ICD-10-CM | POA: Diagnosis not present

## 2016-04-09 DIAGNOSIS — Z992 Dependence on renal dialysis: Secondary | ICD-10-CM | POA: Diagnosis not present

## 2016-04-09 DIAGNOSIS — D509 Iron deficiency anemia, unspecified: Secondary | ICD-10-CM | POA: Diagnosis not present

## 2016-04-09 DIAGNOSIS — N186 End stage renal disease: Secondary | ICD-10-CM | POA: Diagnosis not present

## 2016-04-09 DIAGNOSIS — D631 Anemia in chronic kidney disease: Secondary | ICD-10-CM | POA: Diagnosis not present

## 2016-04-11 DIAGNOSIS — D509 Iron deficiency anemia, unspecified: Secondary | ICD-10-CM | POA: Diagnosis not present

## 2016-04-11 DIAGNOSIS — N186 End stage renal disease: Secondary | ICD-10-CM | POA: Diagnosis not present

## 2016-04-11 DIAGNOSIS — N2581 Secondary hyperparathyroidism of renal origin: Secondary | ICD-10-CM | POA: Diagnosis not present

## 2016-04-11 DIAGNOSIS — Z992 Dependence on renal dialysis: Secondary | ICD-10-CM | POA: Diagnosis not present

## 2016-04-11 DIAGNOSIS — D631 Anemia in chronic kidney disease: Secondary | ICD-10-CM | POA: Diagnosis not present

## 2016-04-14 DIAGNOSIS — N2581 Secondary hyperparathyroidism of renal origin: Secondary | ICD-10-CM | POA: Diagnosis not present

## 2016-04-14 DIAGNOSIS — N186 End stage renal disease: Secondary | ICD-10-CM | POA: Diagnosis not present

## 2016-04-14 DIAGNOSIS — D631 Anemia in chronic kidney disease: Secondary | ICD-10-CM | POA: Diagnosis not present

## 2016-04-14 DIAGNOSIS — Z992 Dependence on renal dialysis: Secondary | ICD-10-CM | POA: Diagnosis not present

## 2016-04-14 DIAGNOSIS — D509 Iron deficiency anemia, unspecified: Secondary | ICD-10-CM | POA: Diagnosis not present

## 2016-04-15 ENCOUNTER — Ambulatory Visit: Payer: Medicare Other | Admitting: Cardiovascular Disease

## 2016-04-15 DIAGNOSIS — M5136 Other intervertebral disc degeneration, lumbar region: Secondary | ICD-10-CM | POA: Diagnosis not present

## 2016-04-15 DIAGNOSIS — M5416 Radiculopathy, lumbar region: Secondary | ICD-10-CM | POA: Diagnosis not present

## 2016-04-16 DIAGNOSIS — D631 Anemia in chronic kidney disease: Secondary | ICD-10-CM | POA: Diagnosis not present

## 2016-04-16 DIAGNOSIS — N186 End stage renal disease: Secondary | ICD-10-CM | POA: Diagnosis not present

## 2016-04-16 DIAGNOSIS — N2581 Secondary hyperparathyroidism of renal origin: Secondary | ICD-10-CM | POA: Diagnosis not present

## 2016-04-16 DIAGNOSIS — Z992 Dependence on renal dialysis: Secondary | ICD-10-CM | POA: Diagnosis not present

## 2016-04-16 DIAGNOSIS — D509 Iron deficiency anemia, unspecified: Secondary | ICD-10-CM | POA: Diagnosis not present

## 2016-04-17 ENCOUNTER — Encounter: Payer: Self-pay | Admitting: *Deleted

## 2016-04-18 DIAGNOSIS — N186 End stage renal disease: Secondary | ICD-10-CM | POA: Diagnosis not present

## 2016-04-18 DIAGNOSIS — Z992 Dependence on renal dialysis: Secondary | ICD-10-CM | POA: Diagnosis not present

## 2016-04-18 DIAGNOSIS — D631 Anemia in chronic kidney disease: Secondary | ICD-10-CM | POA: Diagnosis not present

## 2016-04-18 DIAGNOSIS — N2581 Secondary hyperparathyroidism of renal origin: Secondary | ICD-10-CM | POA: Diagnosis not present

## 2016-04-18 DIAGNOSIS — D509 Iron deficiency anemia, unspecified: Secondary | ICD-10-CM | POA: Diagnosis not present

## 2016-04-21 ENCOUNTER — Encounter: Payer: Self-pay | Admitting: Family

## 2016-04-21 ENCOUNTER — Ambulatory Visit (INDEPENDENT_AMBULATORY_CARE_PROVIDER_SITE_OTHER): Payer: Medicare Other | Admitting: Family

## 2016-04-21 VITALS — BP 132/70 | HR 76 | Temp 98.1°F | Ht 67.0 in | Wt 174.8 lb

## 2016-04-21 DIAGNOSIS — E1122 Type 2 diabetes mellitus with diabetic chronic kidney disease: Secondary | ICD-10-CM

## 2016-04-21 DIAGNOSIS — I10 Essential (primary) hypertension: Secondary | ICD-10-CM

## 2016-04-21 DIAGNOSIS — N186 End stage renal disease: Secondary | ICD-10-CM | POA: Diagnosis not present

## 2016-04-21 DIAGNOSIS — Z992 Dependence on renal dialysis: Secondary | ICD-10-CM | POA: Diagnosis not present

## 2016-04-21 DIAGNOSIS — N2581 Secondary hyperparathyroidism of renal origin: Secondary | ICD-10-CM | POA: Diagnosis not present

## 2016-04-21 DIAGNOSIS — I340 Nonrheumatic mitral (valve) insufficiency: Secondary | ICD-10-CM

## 2016-04-21 DIAGNOSIS — I5032 Chronic diastolic (congestive) heart failure: Secondary | ICD-10-CM

## 2016-04-21 DIAGNOSIS — E1142 Type 2 diabetes mellitus with diabetic polyneuropathy: Secondary | ICD-10-CM | POA: Diagnosis not present

## 2016-04-21 DIAGNOSIS — Z794 Long term (current) use of insulin: Secondary | ICD-10-CM

## 2016-04-21 DIAGNOSIS — D631 Anemia in chronic kidney disease: Secondary | ICD-10-CM | POA: Diagnosis not present

## 2016-04-21 DIAGNOSIS — J449 Chronic obstructive pulmonary disease, unspecified: Secondary | ICD-10-CM | POA: Diagnosis not present

## 2016-04-21 DIAGNOSIS — I2510 Atherosclerotic heart disease of native coronary artery without angina pectoris: Secondary | ICD-10-CM | POA: Diagnosis not present

## 2016-04-21 DIAGNOSIS — D509 Iron deficiency anemia, unspecified: Secondary | ICD-10-CM | POA: Diagnosis not present

## 2016-04-21 MED ORDER — PREGABALIN 75 MG PO CAPS: 75.0000 mg | ORAL_CAPSULE | Freq: Every day | ORAL | 1 refills | Status: DC

## 2016-04-21 NOTE — Assessment & Plan Note (Signed)
Stable. Blood pressure well controlled. Fluid volume overload. Follows with heart failure clinic.

## 2016-04-21 NOTE — Progress Notes (Signed)
Subjective:    Patient ID: Theresa [name], female    DOB: 1955/11/13, 60 y.o.   MRN: [id number]  CC: Theresa [name] is a 60 y.o. female who presents today for follow up.   HPI: Patient here for follow-up.   Feeling well.     ESRD- Never received labs from HD.   DM- Last A1c per patient 5.8% 02/2016. I do not have these records. Follows with podiatry. Eye appointment this year.   HTN- Not dizzy today. Denies exertional chest pain or pressure, numbness or tingling radiating to left arm or jaw, palpitations, frequent headaches, changes in vision, or shortness of breath. Known murmur from moderate/severe MR. Follows with cardiology, reviewed notes from 03/24/2016.        HISTORY:  Past Medical History:  Diagnosis Date  . Anemia of chronic disease   . Carotid arterial disease ([place])    a. 02/2013 U/S: 40-59% bilat ICA stenosis - *f/u 02/2014*  . Chronic constipation   . Chronic diastolic CHF (congestive heart failure) ([place])    a. 10/2013 Echo [facility]): EF 55-60%, mod conc LVH, mod MR, mildly dil LA, mild Ao sclerosis w/o stenosis.  . Colon polyps   . COPD (chronic obstructive pulmonary disease) ([place])   . Coronary artery disease    a. 05/2013 NSTEMI/PCI: LM 20d, LAD min irregs, LCX small, nl, OM1 nl, RCA dom 79m (2.5x16 Promus DES), PDA1 80p.  . Diabetes mellitus   . Diabetic neuropathy ([place])   . Diabetic retinopathy ([place]) 05/28/2013   Hx bilat retinal detachment, proliferative diab retinopathy and bilat vitreous hemorrhage   . Emphysema   . ESRD on hemodialysis ([place])    a. [facility] in [place], [place], on a TTS schedule.  She started dialysis in Feb 2014.  Etiology of renal failure not known, likely diabetes.  Has a left upper arm AV graft.  . History of bronchitis    Mar 2012  . History of pneumonia    June 2012  . History of tobacco abuse    a. Quit 2012.  Marland Kitchen Hyperlipidemia   . Hypertension   . Moderate mitral insufficiency    a. 10/2013 Echo: EF 55-60%, mod MR.  . Peripheral  vascular disease ([place])   . Sickle cell trait [facility])    Past Surgical History:  Procedure Laterality Date  . ABDOMINAL HYSTERECTOMY     2000  . CARDIAC CATHETERIZATION    . CARDIAC CATHETERIZATION N/A 05/02/2015   Procedure: Left Heart Cath and Coronary Angiography;  Surgeon: [name], MD;  Location: [place] CV LAB;  Service: Cardiovascular;  Laterality: N/A;  . colonscopy    . CORONARY ANGIOPLASTY  05/28/2014   stent placement to the mid RCA  . DILATION AND CURETTAGE OF UTERUS     several in the early 80's  . ESOPHAGOGASTRODUODENOSCOPY     2012  . EYE SURGERY     bilateral laser 2012  . EYE SURGERY     right  . GAS INSERTION  09/30/2011   Procedure: INSERTION OF GAS;  Surgeon: [name], MD;  Location: [place];  Service: Ophthalmology;  Laterality: Right;  C3F8  . GAS/FLUID EXCHANGE  09/30/2011   Procedure: GAS/FLUID EXCHANGE;  Surgeon: [name], MD;  Location: [place];  Service: Ophthalmology;  Laterality: Right;  . LEFT HEART CATHETERIZATION WITH CORONARY ANGIOGRAM N/A 05/28/2013   Procedure: LEFT HEART CATHETERIZATION WITH CORONARY ANGIOGRAM;  Surgeon: [name], MD;  Location: [facility] CATH LAB;  Service:  Cardiovascular;  Laterality: N/A;  . PARS PLANA VITRECTOMY  04/22/2011   Procedure: PARS PLANA VITRECTOMY WITH 25 GAUGE;  Surgeon: [name], MD;  Location: [place];  Service: Ophthalmology;  Laterality: Left;  membrane peel, endolaser, gas fluid exchange, silicone oil, repair of complex traction retinal detachment  . PARS PLANA VITRECTOMY  09/30/2011   Procedure: PARS PLANA VITRECTOMY WITH 25 GAUGE;  Surgeon: [name], MD;  Location: [place];  Service: Ophthalmology;  Laterality: Right;  Endolaser; Repair of Complex Traction Retinal Detachment  . PARS PLANA VITRECTOMY  02/24/2012   Procedure: PARS PLANA VITRECTOMY WITH 25 GAUGE;  Surgeon: [name], MD;  Location: [place];  Service: Ophthalmology;  Laterality: Left;  . PTCA    . SILICON OIL  REMOVAL  5/32/9924   Procedure: SILICON OIL REMOVAL;  Surgeon: [name], MD;  Location: [place];  Service: Ophthalmology;  Laterality: Left;  . THROMBECTOMY / ARTERIOVENOUS GRAFT REVISION    . TUBAL LIGATION     1979   Family History  Problem Relation Age of Onset  . Stroke Mother   . Heart attack Mother   . Heart disease Mother   . Colon cancer Father   . Colon cancer Sister   . Heart attack Brother   . Stroke Brother   . Diabetes Brother   . Breast cancer Sister   . Diabetes Brother   . Diabetes Daughter   . Anesthesia problems Neg Hx   . Hypotension Neg Hx   . Malignant hyperthermia Neg Hx   . Pseudochol deficiency Neg Hx     Allergies: Hydrocodone and Metformin Current Outpatient Prescriptions on File Prior to Visit  Medication Sig Dispense Refill  . albuterol (PROVENTIL HFA;VENTOLIN HFA) 108 (90 BASE) MCG/ACT inhaler Inhale 2 puffs into the lungs every 6 (six) hours as needed for wheezing or shortness of breath.    Marland Kitchen albuterol (PROVENTIL) (2.5 MG/3ML) 0.083% nebulizer solution Take 2.5 mg by nebulization every 4 (four) hours as needed for wheezing or shortness of breath.    Marland Kitchen aspirin EC 81 MG tablet Take 81 mg by mouth daily.    . calcium acetate (PHOSLO) 667 MG capsule Take [phone] mg by mouth 3 (three) times daily with meals. Take 5 capsules if you have anything with cheese, otherwise, take 4 capsules if you don't have cheese.    . carvedilol (COREG) 25 MG tablet Take 1 tablet (25 mg total) by mouth 2 (two) times daily with a meal. 60 tablet 0  . cloNIDine (CATAPRES) 0.1 MG tablet Take 1 tablet (0.1 mg total) by mouth 2 (two) times daily. 60 tablet 11  . CRESTOR 40 MG tablet TAKE ONE TABLET BY MOUTH ONCE DAILY 90 tablet 3  . furosemide (LASIX) 80 MG tablet Take 80 mg by mouth daily. Only on Saturdays, Sundays, Tuesdays and Thursdays. (non-dialysis days)    . losartan (COZAAR) 50 MG tablet Take 1 tablet (50 mg total) by mouth daily. 90 tablet 3  . Melatonin 1 MG  CAPS Take 1 capsule by mouth daily.    . multivitamin (RENA-VIT) TABS tablet Take 1 tablet by mouth daily. 30 tablet 5  . nitroGLYCERIN (NITROSTAT) 0.4 MG SL tablet Place 1 tablet (0.4 mg total) under the tongue every 5 (five) minutes as needed for chest pain. 25 tablet 3   Current Facility-Administered Medications on File Prior to Visit  Medication Dose Route Frequency Provider Last Rate Last Dose  . betamethasone acetate-betamethasone sodium phosphate (CELESTONE) injection  3 mg  3 mg Intramuscular Once Edrick Kins, DPM        Social History  Substance Use Topics  . Smoking status: Former Smoker    Packs/day: 1.00    Years: 25.00    Quit date: 06/09/2010  . Smokeless tobacco: Never Used  . Alcohol use No    Review of Systems  Constitutional: Negative for chills and fever.  Eyes: Negative for visual disturbance.  Respiratory: Negative for cough, shortness of breath and wheezing.   Cardiovascular: Negative for chest pain, palpitations and leg swelling.  Gastrointestinal: Negative for nausea and vomiting.  Neurological: Negative for dizziness and headaches.      Objective:    BP 132/70   Pulse 76   Temp 98.1 F (36.7 C) (Oral)   Ht 5\' 7"  (1.702 m)   Wt 174 lb 12.8 oz (79.3 kg)   SpO2 100% Comment: While on oxygen  BMI 27.38 kg/m  BP Readings from Last 3 Encounters:  04/21/16 132/70  03/31/16 (!) 158/72  03/24/16 (!) 157/59   Wt Readings from Last 3 Encounters:  04/21/16 174 lb 12.8 oz (79.3 kg)  03/31/16 174 lb 12.8 oz (79.3 kg)  03/24/16 174 lb (78.9 kg)    Physical Exam  Constitutional: She appears well-developed and well-nourished.  Eyes: Conjunctivae are normal.  Cardiovascular: Normal rate, regular rhythm and normal pulses.   Murmur heard.  Systolic murmur is present with a grade of 2/6  SEM II/VI, Loudest LSB, non radiating, no thrill   Pulmonary/Chest: Effort normal and breath sounds normal. She has no wheezes. She has no rhonchi. She has no rales.    Neurological: She is alert.  Skin: Skin is warm and dry.  Psychiatric: She has a normal mood and affect. Her speech is normal and behavior is normal. Thought content normal.  Vitals reviewed.      Assessment & Plan:   Problem List Items Addressed This Visit      Cardiovascular and Mediastinum   Essential (primary) hypertension (Chronic)    At goal. No signs of volume overload. Continue current regimen.      Diastolic heart failure (HCC)    Stable. Blood pressure well controlled. Fluid volume overload. Follows with heart failure clinic.      Mitral regurgitation    Moderate to severe mitral regurgitation. Reviewed last cardiology note. Symptomatically stable.        Respiratory   COPD (chronic obstructive pulmonary disease) (HCC) (Chronic)    Sao2 100. Placed on O2 in our office. Reemphasized the importance of seeing pulmonology and phone number for pulmonology  given to patient to make appointment.        Endocrine   Diabetes mellitus, type II ([place]) (Chronic)    Last a1c at goal. Patient will send labs from HD today.      Diabetic neuropathy ([place]) - Primary    Foot exam performed. Symptoms well controlled on Lyrica. Lyrica refilled. Continue current regimen.      Relevant Medications   pregabalin (LYRICA) 75 MG capsule       I am having Ms. Nocera maintain her aspirin EC, albuterol, cloNIDine, albuterol, calcium acetate, CRESTOR, furosemide, nitroGLYCERIN, Melatonin, carvedilol, multivitamin, losartan, oxyCODONE-acetaminophen, and pregabalin. We will continue to administer betamethasone acetate-betamethasone sodium phosphate.   Meds ordered this encounter  Medications  . oxyCODONE-acetaminophen (PERCOCET/ROXICET) 5-325 MG tablet    Sig: 1 po qday prn  . pregabalin (LYRICA) 75 MG capsule    Sig: Take 1 capsule (75 mg  total) by mouth daily.    Dispense:  90 capsule    Refill:  1    Order Specific Question:   Supervising Provider    Answer:   Crecencio Mc  [[id number]]    Return precautions given.   Risks, benefits, and alternatives of the medications and treatment plan prescribed today were discussed, and patient expressed understanding.   Education regarding symptom management and diagnosis given to patient on AVS.  Continue to follow with Mable Paris, FNP for routine health maintenance.   Theresa [name] and I agreed with plan.   Mable Paris, FNP

## 2016-04-21 NOTE — Assessment & Plan Note (Addendum)
Sao2 100. Placed on O2 in our office. Reemphasized the importance of seeing pulmonology and phone number for pulmonology  given to patient to make appointment.

## 2016-04-21 NOTE — Patient Instructions (Addendum)
FAX labs to [facility]  Pulmonology referral. Let me know if you do not have appointment after this week.   Please purchase pulse oximetry at home. Goal is for oxygen greater than 93%.   Pleasure seeing you.

## 2016-04-21 NOTE — Assessment & Plan Note (Signed)
Moderate to severe mitral regurgitation. Reviewed last cardiology note. Symptomatically stable.

## 2016-04-21 NOTE — Assessment & Plan Note (Addendum)
Foot exam performed. Symptoms well controlled on Lyrica. Lyrica refilled. Continue current regimen.

## 2016-04-21 NOTE — Progress Notes (Signed)
Pre visit review using our clinic review tool, if applicable. No additional management support is needed unless otherwise documented below in the visit note. 

## 2016-04-21 NOTE — Assessment & Plan Note (Signed)
At goal. No signs of volume overload. Continue current regimen.

## 2016-04-21 NOTE — Assessment & Plan Note (Signed)
Last a1c at goal. Patient will send labs from HD today.

## 2016-04-23 DIAGNOSIS — Z992 Dependence on renal dialysis: Secondary | ICD-10-CM | POA: Diagnosis not present

## 2016-04-23 DIAGNOSIS — D631 Anemia in chronic kidney disease: Secondary | ICD-10-CM | POA: Diagnosis not present

## 2016-04-23 DIAGNOSIS — N2581 Secondary hyperparathyroidism of renal origin: Secondary | ICD-10-CM | POA: Diagnosis not present

## 2016-04-23 DIAGNOSIS — N186 End stage renal disease: Secondary | ICD-10-CM | POA: Diagnosis not present

## 2016-04-23 DIAGNOSIS — D509 Iron deficiency anemia, unspecified: Secondary | ICD-10-CM | POA: Diagnosis not present

## 2016-04-25 ENCOUNTER — Ambulatory Visit (INDEPENDENT_AMBULATORY_CARE_PROVIDER_SITE_OTHER): Payer: Medicare Other | Admitting: Cardiovascular Disease

## 2016-04-25 ENCOUNTER — Encounter: Payer: Self-pay | Admitting: Cardiovascular Disease

## 2016-04-25 VITALS — BP 130/64 | HR 69 | Ht 67.5 in | Wt 172.5 lb

## 2016-04-25 DIAGNOSIS — I2510 Atherosclerotic heart disease of native coronary artery without angina pectoris: Secondary | ICD-10-CM

## 2016-04-25 DIAGNOSIS — Z955 Presence of coronary angioplasty implant and graft: Secondary | ICD-10-CM

## 2016-04-25 DIAGNOSIS — I340 Nonrheumatic mitral (valve) insufficiency: Secondary | ICD-10-CM

## 2016-04-25 DIAGNOSIS — I10 Essential (primary) hypertension: Secondary | ICD-10-CM

## 2016-04-25 DIAGNOSIS — Z992 Dependence on renal dialysis: Secondary | ICD-10-CM

## 2016-04-25 DIAGNOSIS — E1142 Type 2 diabetes mellitus with diabetic polyneuropathy: Secondary | ICD-10-CM

## 2016-04-25 DIAGNOSIS — D509 Iron deficiency anemia, unspecified: Secondary | ICD-10-CM | POA: Diagnosis not present

## 2016-04-25 DIAGNOSIS — N186 End stage renal disease: Secondary | ICD-10-CM | POA: Diagnosis not present

## 2016-04-25 DIAGNOSIS — N2581 Secondary hyperparathyroidism of renal origin: Secondary | ICD-10-CM | POA: Diagnosis not present

## 2016-04-25 DIAGNOSIS — D631 Anemia in chronic kidney disease: Secondary | ICD-10-CM | POA: Diagnosis not present

## 2016-04-25 NOTE — Progress Notes (Signed)
Cardiology Office Note  Date:  04/25/2016   ID:  Theresa [name], DOB March 11, 1956, MRN [id number]  PCP:  Theresa Paris, FNP   Chief Complaint  Patient presents with  . other    70mo f/u echo/carotid duplex bilateral on 02/26/16. Pt had office visit with Dr. Jackelyn [name] 03/24/16. Pt states she is doing well. Reviewed meds with pt verbally.    HPI:  Theresa [name] is a pleasant 60 -year old woman with 25 years of smoking (quit in 2013), diabetes, chronic renal insufficiency,  previously noted to have moderate to severe mitral valve regurgitation on prior echocardiogram (improved to moderate MR on recent echo), pneumonia in June of 2012 with admission overnight to [name] who initially presented by referral from [name] (renal) for evaluation of her ejection fraction which is 45% and mitral valve disease.  on dialysis since February 2014, 3 days per week. She has 40-59% bilateral carotid arterial disease as of September 2014  Drug-eluting stent placed December 2014 to the mid RCA She presents today for follow-up of her coronary artery disease  Positive stress test  03/2016 at [name] Cardiac catheterization showed 70% disease ostial right PDA, unchanged from 2014 cardiac catheterization otherwise other regions of 20-30% disease, nonobstructive  In follow-up today she reports that she is doing well Reports her diabetes numbers are well controlled, Presented to [name] September 2017 with chest tightness, shortness of breath Reports they were not pulling enough fluid off with hemodialysis on Fridays, had trouble getting to Monday without shortness of breath symptoms.  Now she tries to have been pulled more on Friday so she can make it to Monday Before was drinking too much on the weekend She does make some urine, she is on Lasix  HBa1C 5.8 On HD since 2014 On transplant list, since 2014  EKG on today's visit shows normal sinus rhythm with rate 69 bpm, no significant ST or T-wave changes  Other  past medical history reviewed Echocardiogram shows normal LV function, right-sided pressures 45 mmHg moderate MR  evaluation in the [name]  05/12/2014 for fluid overload, hyperkalemia, shortness of breath, saturations 89% on room air, systolic blood pressure 010. She had dialysis with improvement of her symptoms. She does hemodialysis on Monday, Wednesday, Friday She was told that her potassium intake was too high.   Previously he would wake up with tachycardia.  sits up and symptoms typically resolve.   [name] admission for pneumonia 05/28/2013, elevated cardiac enzymes, transferred to [name] for cardiac catheterization. She had stent placed for 95% mid RCA lesion. She had 2.5 x 16 mm Promus stent placed, post dilated up to 2.78 mm . Since then she reports she is doing well with no further chest pain or shortness of breath . She is on the renal transplant list but reports this will require her to be on the list for 5 years before transplant eligibility   Readmission to the [name] 06/14/2013 for C. difficile colitis. Discharge on 06/16/2013 C. difficile was indigent positive, toxin negative. She was treated with Flagyl for 10 days  Interestingly Stress test at [name] August 2014 was essentially normal with normal ejection fraction (there was mention of very small mild region of artifact) Echocardiogram at [name] July 2014 showed ejection fraction greater than 55%, mild MR  She sees [name]  of [name] .  previous  AV graft on 02/23/2013. Graft was thrombosed. Status post revision   Echocardiogram on November 10, 2010 shows mildly dilated left ventricle, ejection fraction  45-50%, mild MS, moderate to severe mitral regurg, mild to moderate tricuspid regurg.   PMH:   has a past medical history of Anemia of chronic disease; Carotid arterial disease (Theresa [name]); Chronic constipation; Chronic diastolic CHF (congestive heart failure) (Theresa [name]); Colon polyps; COPD (chronic obstructive  pulmonary disease) (Theresa [name]); Coronary artery disease; Diabetes mellitus; Diabetic neuropathy (Theresa [name]); Diabetic retinopathy (Theresa [name]) (05/28/2013); Emphysema; ESRD on hemodialysis (Theresa [name]); History of bronchitis; History of pneumonia; History of tobacco abuse; Hyperlipidemia; Hypertension; Moderate mitral insufficiency; Peripheral vascular disease (Theresa [name]); and Sickle cell trait (Theresa [name]).  PSH:    Past Surgical History:  Procedure Laterality Date  . ABDOMINAL HYSTERECTOMY     2000  . CARDIAC CATHETERIZATION    . CARDIAC CATHETERIZATION N/A 05/02/2015   Procedure: Left Heart Cath and Coronary Angiography;  Surgeon: Theresa Hampshire, MD;  Location: [name];  Service: Cardiovascular;  Laterality: N/A;  . colonscopy    . CORONARY ANGIOPLASTY  05/28/2014   stent placement to the mid RCA  . DILATION AND CURETTAGE OF UTERUS     several in the early 80's  . ESOPHAGOGASTRODUODENOSCOPY     2012  . EYE SURGERY     bilateral laser 2012  . EYE SURGERY     right  . GAS INSERTION  09/30/2011   Procedure: INSERTION OF GAS;  Surgeon: Theresa Pedro, MD;  Location: [name];  Service: Ophthalmology;  Laterality: Right;  C3F8  . GAS/FLUID EXCHANGE  09/30/2011   Procedure: GAS/FLUID EXCHANGE;  Surgeon: Theresa Pedro, MD;  Location: [place];  Service: Ophthalmology;  Laterality: Right;  . LEFT HEART CATHETERIZATION WITH CORONARY ANGIOGRAM N/A 05/28/2013   Procedure: LEFT HEART CATHETERIZATION WITH CORONARY ANGIOGRAM;  Surgeon: Theresa Booze, MD;  Location: [name];  Service: Cardiovascular;  Laterality: N/A;  . PARS PLANA VITRECTOMY  04/22/2011   Procedure: PARS PLANA VITRECTOMY WITH 25 GAUGE;  Surgeon: Theresa Pedro, MD;  Location: [name];  Service: Ophthalmology;  Laterality: Left;  membrane peel, endolaser, gas fluid exchange, silicone oil, repair of complex traction retinal detachment  . PARS PLANA VITRECTOMY  09/30/2011   Procedure: PARS PLANA VITRECTOMY WITH 25 GAUGE;  Surgeon: Theresa Pedro, MD;   Location: [place];  Service: Ophthalmology;  Laterality: Right;  Endolaser; Repair of Complex Traction Retinal Detachment  . PARS PLANA VITRECTOMY  02/24/2012   Procedure: PARS PLANA VITRECTOMY WITH 25 GAUGE;  Surgeon: Theresa Pedro, MD;  Location: [place];  Service: Ophthalmology;  Laterality: Left;  . PTCA    . SILICON OIL REMOVAL  4/74/2595   Procedure: SILICON OIL REMOVAL;  Surgeon: Theresa Pedro, MD;  Location: [place];  Service: Ophthalmology;  Laterality: Left;  . THROMBECTOMY / ARTERIOVENOUS GRAFT REVISION    . TUBAL LIGATION     1979    Current Outpatient Prescriptions  Medication Sig Dispense Refill  . albuterol (PROVENTIL HFA;VENTOLIN HFA) 108 (90 BASE) MCG/ACT inhaler Inhale 2 puffs into the lungs every 6 (six) hours as needed for wheezing or shortness of breath.    Marland Kitchen albuterol (PROVENTIL) (2.5 MG/3ML) 0.083% nebulizer solution Take 2.5 mg by nebulization every 4 (four) hours as needed for wheezing or shortness of breath.    Marland Kitchen aspirin EC 81 MG tablet Take 81 mg by mouth daily.    . calcium acetate (PHOSLO) 667 MG capsule Take [phone] mg by mouth 3 (three) times daily with meals. Take 5 capsules if you have anything with cheese, otherwise, take 4 capsules if you don't have  cheese.    . carvedilol (COREG) 25 MG tablet Take 1 tablet (25 mg total) by mouth 2 (two) times daily with a meal. 60 tablet 0  . cloNIDine (CATAPRES) 0.1 MG tablet Take 1 tablet (0.1 mg total) by mouth 2 (two) times daily. 60 tablet 11  . CRESTOR 40 MG tablet TAKE ONE TABLET BY MOUTH ONCE DAILY 90 tablet 3  . furosemide (LASIX) 80 MG tablet Take 80 mg by mouth daily. Only on Saturdays, Sundays, Tuesdays and Thursdays. (non-dialysis days)    . losartan (COZAAR) 50 MG tablet Take 1 tablet (50 mg total) by mouth daily. 90 tablet 3  . Melatonin 1 MG CAPS Take 1 capsule by mouth daily.    . multivitamin (RENA-VIT) TABS tablet Take 1 tablet by mouth daily. 30 tablet 5  . nitroGLYCERIN (NITROSTAT) 0.4 MG SL tablet  Place 1 tablet (0.4 mg total) under the tongue every 5 (five) minutes as needed for chest pain. 25 tablet 3  . oxyCODONE-acetaminophen (PERCOCET/ROXICET) 5-325 MG tablet 1 po qday prn    . pregabalin (LYRICA) 75 MG capsule Take 1 capsule (75 mg total) by mouth daily. 90 capsule 1   Current Facility-Administered Medications  Medication Dose Route Frequency Provider Last Rate Last Dose  . betamethasone acetate-betamethasone sodium phosphate (CELESTONE) injection 3 mg  3 mg Intramuscular Once Edrick Kins, DPM         Allergies:   Hydrocodone and Metformin   Social History:  The patient  reports that she quit smoking about 5 years ago. She has a 25.00 pack-year smoking history. She has never used smokeless tobacco. She reports that she does not drink alcohol or use drugs.   Family History:   family history includes Breast cancer in her sister; Colon cancer in her father and sister; Diabetes in her brother, brother, and daughter; Heart attack in her brother and mother; Heart disease in her mother; Stroke in her brother and mother.    Review of Systems: Review of Systems  Constitutional: Negative.   Respiratory: Negative.   Cardiovascular: Negative.   Gastrointestinal: Negative.   Musculoskeletal: Negative.   Neurological: Negative.   Psychiatric/Behavioral: Negative.   All other systems reviewed and are negative.    PHYSICAL EXAM: VS:  BP 130/64 (BP Location: Right Arm, Patient Position: Sitting, Cuff Size: Normal)   Pulse 69   Ht 5' 7.5" (1.715 m)   Wt 172 lb 8 oz (78.2 kg)   BMI 26.62 kg/m  , BMI Body mass index is 26.62 kg/m. GEN: Well nourished, well developed, in no acute distress  HEENT: normal  Neck: no JVD, carotid bruits, or masses Cardiac: RRR;2+ SEM LSB,rubs, or gallops,no edema  Respiratory:  clear to auscultation bilaterally, normal work of breathing GI: soft, nontender, nondistended, + BS MS: no deformity or atrophy  Skin: warm and dry, no rash Neuro:   Strength and sensation are intact Psych: euthymic mood, full affect    Recent Labs: 05/02/2015: B Natriuretic Peptide 703.0 07/27/2015: ALT 24 02/22/2016: Hemoglobin 10.2; Platelets 124 02/23/2016: BUN 42; Creatinine, Ser 10.11; Potassium 4.3; Sodium 141    Lipid Panel [name] Results  Component Value Date   CHOL 125 11/24/2014   HDL 59 11/24/2014   LDLCALC 52 11/24/2014   TRIG 70 11/24/2014      Wt Readings from Last 3 Encounters:  04/25/16 172 lb 8 oz (78.2 kg)  04/21/16 174 lb 12.8 oz (79.3 kg)  03/31/16 174 lb 12.8 oz (79.3 kg)  ASSESSMENT AND PLAN:  Essential (primary) hypertension - Plan: EKG 12-Lead Blood pressure is well controlled on today's visit. No changes made to the medications.  Coronary artery disease involving native coronary artery of native heart without angina pectoris - Plan: EKG 12-Lead Currently with no symptoms of angina. No further workup at this time. Continue current medication regimen.  Mitral valve insufficiency, unspecified etiology - Plan: EKG 12-Lead Previous echocardiogram September 2017 with moderate to severe MR Likely dynamic MR, will change with fluid status from hemodialysis Since then has had more aggressive hemodialysis especially on Fridays Denies shortness of breath, leg swelling, overall feels well  ESRD on hemodialysis [name]) Monday Wednesday Friday, dialysis, Lasix on other days She's cut down on her fluids on weekends to she can make it to Monday without shortness of breath symptoms. Previous hospitalization reviewed from September 2017, Has had less symptoms with more aggressive diuresis on Fridays  Diabetic polyneuropathy associated with type 2 diabetes mellitus ([place]) Long discussion concerning her diabetes control, she is doing much better A1c is down  S/P coronary artery stent placement   Total encounter time more than 25 minutes  Greater than 50% was spent in counseling and coordination of care with the  patient   Disposition:   F/U  6 months   Orders Placed This Encounter  Procedures  . EKG 12-Lead     Signed, Esmond Plants, M.D., Ph.D. 04/25/2016  [place], [place]

## 2016-04-25 NOTE — Patient Instructions (Signed)

## 2016-04-27 DIAGNOSIS — D509 Iron deficiency anemia, unspecified: Secondary | ICD-10-CM | POA: Diagnosis not present

## 2016-04-27 DIAGNOSIS — D631 Anemia in chronic kidney disease: Secondary | ICD-10-CM | POA: Diagnosis not present

## 2016-04-27 DIAGNOSIS — N186 End stage renal disease: Secondary | ICD-10-CM | POA: Diagnosis not present

## 2016-04-27 DIAGNOSIS — N2581 Secondary hyperparathyroidism of renal origin: Secondary | ICD-10-CM | POA: Diagnosis not present

## 2016-04-27 DIAGNOSIS — Z992 Dependence on renal dialysis: Secondary | ICD-10-CM | POA: Diagnosis not present

## 2016-04-29 DIAGNOSIS — Z992 Dependence on renal dialysis: Secondary | ICD-10-CM | POA: Diagnosis not present

## 2016-04-29 DIAGNOSIS — D509 Iron deficiency anemia, unspecified: Secondary | ICD-10-CM | POA: Diagnosis not present

## 2016-04-29 DIAGNOSIS — D631 Anemia in chronic kidney disease: Secondary | ICD-10-CM | POA: Diagnosis not present

## 2016-04-29 DIAGNOSIS — N2581 Secondary hyperparathyroidism of renal origin: Secondary | ICD-10-CM | POA: Diagnosis not present

## 2016-04-29 DIAGNOSIS — N186 End stage renal disease: Secondary | ICD-10-CM | POA: Diagnosis not present

## 2016-05-02 DIAGNOSIS — Z992 Dependence on renal dialysis: Secondary | ICD-10-CM | POA: Diagnosis not present

## 2016-05-02 DIAGNOSIS — D631 Anemia in chronic kidney disease: Secondary | ICD-10-CM | POA: Diagnosis not present

## 2016-05-02 DIAGNOSIS — N2581 Secondary hyperparathyroidism of renal origin: Secondary | ICD-10-CM | POA: Diagnosis not present

## 2016-05-02 DIAGNOSIS — D509 Iron deficiency anemia, unspecified: Secondary | ICD-10-CM | POA: Diagnosis not present

## 2016-05-02 DIAGNOSIS — N186 End stage renal disease: Secondary | ICD-10-CM | POA: Diagnosis not present

## 2016-05-05 DIAGNOSIS — D509 Iron deficiency anemia, unspecified: Secondary | ICD-10-CM | POA: Diagnosis not present

## 2016-05-05 DIAGNOSIS — N2581 Secondary hyperparathyroidism of renal origin: Secondary | ICD-10-CM | POA: Diagnosis not present

## 2016-05-05 DIAGNOSIS — Z992 Dependence on renal dialysis: Secondary | ICD-10-CM | POA: Diagnosis not present

## 2016-05-05 DIAGNOSIS — N186 End stage renal disease: Secondary | ICD-10-CM | POA: Diagnosis not present

## 2016-05-05 DIAGNOSIS — D631 Anemia in chronic kidney disease: Secondary | ICD-10-CM | POA: Diagnosis not present

## 2016-05-07 DIAGNOSIS — D509 Iron deficiency anemia, unspecified: Secondary | ICD-10-CM | POA: Diagnosis not present

## 2016-05-07 DIAGNOSIS — D631 Anemia in chronic kidney disease: Secondary | ICD-10-CM | POA: Diagnosis not present

## 2016-05-07 DIAGNOSIS — N186 End stage renal disease: Secondary | ICD-10-CM | POA: Diagnosis not present

## 2016-05-07 DIAGNOSIS — Z992 Dependence on renal dialysis: Secondary | ICD-10-CM | POA: Diagnosis not present

## 2016-05-07 DIAGNOSIS — N2581 Secondary hyperparathyroidism of renal origin: Secondary | ICD-10-CM | POA: Diagnosis not present

## 2016-05-08 DIAGNOSIS — Z992 Dependence on renal dialysis: Secondary | ICD-10-CM | POA: Diagnosis not present

## 2016-05-08 DIAGNOSIS — N186 End stage renal disease: Secondary | ICD-10-CM | POA: Diagnosis not present

## 2016-05-09 DIAGNOSIS — Z992 Dependence on renal dialysis: Secondary | ICD-10-CM | POA: Diagnosis not present

## 2016-05-09 DIAGNOSIS — N2581 Secondary hyperparathyroidism of renal origin: Secondary | ICD-10-CM | POA: Diagnosis not present

## 2016-05-09 DIAGNOSIS — M5136 Other intervertebral disc degeneration, lumbar region: Secondary | ICD-10-CM | POA: Diagnosis not present

## 2016-05-09 DIAGNOSIS — M5416 Radiculopathy, lumbar region: Secondary | ICD-10-CM | POA: Diagnosis not present

## 2016-05-09 DIAGNOSIS — N186 End stage renal disease: Secondary | ICD-10-CM | POA: Diagnosis not present

## 2016-05-09 DIAGNOSIS — D509 Iron deficiency anemia, unspecified: Secondary | ICD-10-CM | POA: Diagnosis not present

## 2016-05-09 DIAGNOSIS — D631 Anemia in chronic kidney disease: Secondary | ICD-10-CM | POA: Diagnosis not present

## 2016-05-12 DIAGNOSIS — N2581 Secondary hyperparathyroidism of renal origin: Secondary | ICD-10-CM | POA: Diagnosis not present

## 2016-05-12 DIAGNOSIS — D631 Anemia in chronic kidney disease: Secondary | ICD-10-CM | POA: Diagnosis not present

## 2016-05-12 DIAGNOSIS — N186 End stage renal disease: Secondary | ICD-10-CM | POA: Diagnosis not present

## 2016-05-12 DIAGNOSIS — Z992 Dependence on renal dialysis: Secondary | ICD-10-CM | POA: Diagnosis not present

## 2016-05-12 DIAGNOSIS — D509 Iron deficiency anemia, unspecified: Secondary | ICD-10-CM | POA: Diagnosis not present

## 2016-05-14 DIAGNOSIS — D509 Iron deficiency anemia, unspecified: Secondary | ICD-10-CM | POA: Diagnosis not present

## 2016-05-14 DIAGNOSIS — N186 End stage renal disease: Secondary | ICD-10-CM | POA: Diagnosis not present

## 2016-05-14 DIAGNOSIS — Z992 Dependence on renal dialysis: Secondary | ICD-10-CM | POA: Diagnosis not present

## 2016-05-14 DIAGNOSIS — D631 Anemia in chronic kidney disease: Secondary | ICD-10-CM | POA: Diagnosis not present

## 2016-05-14 DIAGNOSIS — N2581 Secondary hyperparathyroidism of renal origin: Secondary | ICD-10-CM | POA: Diagnosis not present

## 2016-05-16 DIAGNOSIS — N186 End stage renal disease: Secondary | ICD-10-CM | POA: Diagnosis not present

## 2016-05-16 DIAGNOSIS — D631 Anemia in chronic kidney disease: Secondary | ICD-10-CM | POA: Diagnosis not present

## 2016-05-16 DIAGNOSIS — Z992 Dependence on renal dialysis: Secondary | ICD-10-CM | POA: Diagnosis not present

## 2016-05-16 DIAGNOSIS — N2581 Secondary hyperparathyroidism of renal origin: Secondary | ICD-10-CM | POA: Diagnosis not present

## 2016-05-16 DIAGNOSIS — D509 Iron deficiency anemia, unspecified: Secondary | ICD-10-CM | POA: Diagnosis not present

## 2016-05-19 DIAGNOSIS — N2581 Secondary hyperparathyroidism of renal origin: Secondary | ICD-10-CM | POA: Diagnosis not present

## 2016-05-19 DIAGNOSIS — Z992 Dependence on renal dialysis: Secondary | ICD-10-CM | POA: Diagnosis not present

## 2016-05-19 DIAGNOSIS — N186 End stage renal disease: Secondary | ICD-10-CM | POA: Diagnosis not present

## 2016-05-19 DIAGNOSIS — D631 Anemia in chronic kidney disease: Secondary | ICD-10-CM | POA: Diagnosis not present

## 2016-05-19 DIAGNOSIS — D509 Iron deficiency anemia, unspecified: Secondary | ICD-10-CM | POA: Diagnosis not present

## 2016-05-21 ENCOUNTER — Encounter: Payer: Self-pay | Admitting: Emergency Medicine

## 2016-05-21 ENCOUNTER — Emergency Department: Payer: Medicare Other

## 2016-05-21 ENCOUNTER — Observation Stay
Admission: EM | Admit: 2016-05-21 | Discharge: 2016-05-24 | Disposition: A | Discharge status: Home / Self Care | Payer: Medicare Other | Attending: Internal Medicine | Admitting: Internal Medicine

## 2016-05-21 DIAGNOSIS — I5032 Chronic diastolic (congestive) heart failure: Secondary | ICD-10-CM | POA: Diagnosis not present

## 2016-05-21 DIAGNOSIS — G4733 Obstructive sleep apnea (adult) (pediatric): Secondary | ICD-10-CM | POA: Insufficient documentation

## 2016-05-21 DIAGNOSIS — M65971 Unspecified synovitis and tenosynovitis, right ankle and foot: Secondary | ICD-10-CM

## 2016-05-21 DIAGNOSIS — I5033 Acute on chronic diastolic (congestive) heart failure: Secondary | ICD-10-CM | POA: Diagnosis present

## 2016-05-21 DIAGNOSIS — M5137 Other intervertebral disc degeneration, lumbosacral region: Secondary | ICD-10-CM | POA: Insufficient documentation

## 2016-05-21 DIAGNOSIS — R112 Nausea with vomiting, unspecified: Secondary | ICD-10-CM | POA: Diagnosis not present

## 2016-05-21 DIAGNOSIS — M25571 Pain in right ankle and joints of right foot: Secondary | ICD-10-CM

## 2016-05-21 DIAGNOSIS — N189 Chronic kidney disease, unspecified: Secondary | ICD-10-CM

## 2016-05-21 DIAGNOSIS — I700 Atherosclerosis of aorta: Secondary | ICD-10-CM | POA: Insufficient documentation

## 2016-05-21 DIAGNOSIS — Z87891 Personal history of nicotine dependence: Secondary | ICD-10-CM | POA: Insufficient documentation

## 2016-05-21 DIAGNOSIS — I6523 Occlusion and stenosis of bilateral carotid arteries: Secondary | ICD-10-CM | POA: Diagnosis not present

## 2016-05-21 DIAGNOSIS — E1151 Type 2 diabetes mellitus with diabetic peripheral angiopathy without gangrene: Secondary | ICD-10-CM | POA: Diagnosis not present

## 2016-05-21 DIAGNOSIS — J449 Chronic obstructive pulmonary disease, unspecified: Secondary | ICD-10-CM | POA: Diagnosis not present

## 2016-05-21 DIAGNOSIS — E119 Type 2 diabetes mellitus without complications: Secondary | ICD-10-CM | POA: Diagnosis not present

## 2016-05-21 DIAGNOSIS — I252 Old myocardial infarction: Secondary | ICD-10-CM | POA: Insufficient documentation

## 2016-05-21 DIAGNOSIS — M1611 Unilateral primary osteoarthritis, right hip: Secondary | ICD-10-CM | POA: Insufficient documentation

## 2016-05-21 DIAGNOSIS — I2510 Atherosclerotic heart disease of native coronary artery without angina pectoris: Secondary | ICD-10-CM | POA: Insufficient documentation

## 2016-05-21 DIAGNOSIS — I340 Nonrheumatic mitral (valve) insufficiency: Secondary | ICD-10-CM | POA: Diagnosis not present

## 2016-05-21 DIAGNOSIS — Z794 Long term (current) use of insulin: Secondary | ICD-10-CM | POA: Insufficient documentation

## 2016-05-21 DIAGNOSIS — M7751 Other enthesopathy of right foot: Secondary | ICD-10-CM

## 2016-05-21 DIAGNOSIS — Z955 Presence of coronary angioplasty implant and graft: Secondary | ICD-10-CM | POA: Diagnosis not present

## 2016-05-21 DIAGNOSIS — R55 Syncope and collapse: Principal | ICD-10-CM

## 2016-05-21 DIAGNOSIS — E1142 Type 2 diabetes mellitus with diabetic polyneuropathy: Secondary | ICD-10-CM | POA: Insufficient documentation

## 2016-05-21 DIAGNOSIS — E785 Hyperlipidemia, unspecified: Secondary | ICD-10-CM | POA: Insufficient documentation

## 2016-05-21 DIAGNOSIS — I132 Hypertensive heart and chronic kidney disease with heart failure and with stage 5 chronic kidney disease, or end stage renal disease: Secondary | ICD-10-CM | POA: Insufficient documentation

## 2016-05-21 DIAGNOSIS — R2681 Unsteadiness on feet: Secondary | ICD-10-CM

## 2016-05-21 DIAGNOSIS — Z7982 Long term (current) use of aspirin: Secondary | ICD-10-CM | POA: Insufficient documentation

## 2016-05-21 DIAGNOSIS — I10 Essential (primary) hypertension: Secondary | ICD-10-CM | POA: Diagnosis present

## 2016-05-21 DIAGNOSIS — Z8249 Family history of ischemic heart disease and other diseases of the circulatory system: Secondary | ICD-10-CM | POA: Insufficient documentation

## 2016-05-21 DIAGNOSIS — D573 Sickle-cell trait: Secondary | ICD-10-CM | POA: Insufficient documentation

## 2016-05-21 DIAGNOSIS — R079 Chest pain, unspecified: Secondary | ICD-10-CM

## 2016-05-21 DIAGNOSIS — Z800 Family history of malignant neoplasm of digestive organs: Secondary | ICD-10-CM | POA: Insufficient documentation

## 2016-05-21 DIAGNOSIS — I160 Hypertensive urgency: Secondary | ICD-10-CM | POA: Diagnosis not present

## 2016-05-21 DIAGNOSIS — E1122 Type 2 diabetes mellitus with diabetic chronic kidney disease: Secondary | ICD-10-CM | POA: Diagnosis not present

## 2016-05-21 DIAGNOSIS — M659 Synovitis and tenosynovitis, unspecified: Secondary | ICD-10-CM

## 2016-05-21 DIAGNOSIS — D631 Anemia in chronic kidney disease: Secondary | ICD-10-CM | POA: Diagnosis not present

## 2016-05-21 DIAGNOSIS — N186 End stage renal disease: Secondary | ICD-10-CM | POA: Diagnosis not present

## 2016-05-21 DIAGNOSIS — D509 Iron deficiency anemia, unspecified: Secondary | ICD-10-CM | POA: Diagnosis not present

## 2016-05-21 DIAGNOSIS — R4701 Aphasia: Secondary | ICD-10-CM | POA: Diagnosis not present

## 2016-05-21 DIAGNOSIS — E11319 Type 2 diabetes mellitus with unspecified diabetic retinopathy without macular edema: Secondary | ICD-10-CM | POA: Diagnosis not present

## 2016-05-21 DIAGNOSIS — R0789 Other chest pain: Secondary | ICD-10-CM | POA: Diagnosis not present

## 2016-05-21 DIAGNOSIS — Z86010 Personal history of colonic polyps: Secondary | ICD-10-CM | POA: Insufficient documentation

## 2016-05-21 DIAGNOSIS — Z9989 Dependence on other enabling machines and devices: Secondary | ICD-10-CM

## 2016-05-21 DIAGNOSIS — N2581 Secondary hyperparathyroidism of renal origin: Secondary | ICD-10-CM | POA: Diagnosis not present

## 2016-05-21 DIAGNOSIS — Z885 Allergy status to narcotic agent status: Secondary | ICD-10-CM | POA: Diagnosis not present

## 2016-05-21 DIAGNOSIS — I25118 Atherosclerotic heart disease of native coronary artery with other forms of angina pectoris: Secondary | ICD-10-CM | POA: Diagnosis present

## 2016-05-21 DIAGNOSIS — G8929 Other chronic pain: Secondary | ICD-10-CM

## 2016-05-21 DIAGNOSIS — Z888 Allergy status to other drugs, medicaments and biological substances status: Secondary | ICD-10-CM | POA: Insufficient documentation

## 2016-05-21 DIAGNOSIS — M25471 Effusion, right ankle: Secondary | ICD-10-CM

## 2016-05-21 DIAGNOSIS — Z992 Dependence on renal dialysis: Secondary | ICD-10-CM | POA: Insufficient documentation

## 2016-05-21 LAB — CBC
HCT: 33.6 % — ABNORMAL LOW (ref 35.0–47.0)
Hemoglobin: 11.5 g/dL — ABNORMAL LOW (ref 12.0–16.0)
MCH: 34 pg (ref 26.0–34.0)
MCHC: 34.1 g/dL (ref 32.0–36.0)
MCV: 99.6 fL (ref 80.0–100.0)
PLATELETS: 155 10*3/uL (ref 150–440)
RBC: 3.37 MIL/uL — AB (ref 3.80–5.20)
RDW: 16.2 % — ABNORMAL HIGH (ref 11.5–14.5)
WBC: 7.5 10*3/uL (ref 3.6–11.0)

## 2016-05-21 LAB — BASIC METABOLIC PANEL
Anion gap: 9 (ref 5–15)
BUN: 27 mg/dL — ABNORMAL HIGH (ref 6–20)
CALCIUM: 8.4 mg/dL — AB (ref 8.9–10.3)
CO2: 31 mmol/L (ref 22–32)
CREATININE: 4.98 mg/dL — AB (ref 0.44–1.00)
Chloride: 98 mmol/L — ABNORMAL LOW (ref 101–111)
GFR, EST AFRICAN AMERICAN: 10 mL/min — AB (ref >60)
GFR, EST NON AFRICAN AMERICAN: 9 mL/min — AB (ref >60)
Glucose, Bld: 232 mg/dL — ABNORMAL HIGH (ref 65–99)
Potassium: 4.1 mmol/L (ref 3.5–5.1)
SODIUM: 138 mmol/L (ref 135–145)

## 2016-05-21 LAB — GLUCOSE, CAPILLARY
Glucose-Capillary: 119 mg/dL — ABNORMAL HIGH (ref 65–99)
Glucose-Capillary: 222 mg/dL — ABNORMAL HIGH (ref 65–99)

## 2016-05-21 LAB — TROPONIN I: Troponin I: 0.03 ng/mL (ref <0.03)

## 2016-05-21 LAB — MRSA PCR SCREENING: MRSA BY PCR: NEGATIVE

## 2016-05-21 MED ORDER — MELATONIN 5 MG PO TABS
5.0000 mg | ORAL_TABLET | Freq: Every evening | ORAL | Status: DC | PRN
  Filled 2016-05-21: qty 1

## 2016-05-21 MED ORDER — ENOXAPARIN SODIUM 30 MG/0.3ML ~~LOC~~ SOLN
30.0000 mg | SUBCUTANEOUS | Status: DC
Start: 2016-05-21 — End: 2016-05-21

## 2016-05-21 MED ORDER — INSULIN ASPART 100 UNIT/ML ~~LOC~~ SOLN
0.0000 [IU] | Freq: Every day | SUBCUTANEOUS | Status: DC
  Administered 2016-05-21 – 2016-05-22 (×2): 2 [IU] via SUBCUTANEOUS
  Filled 2016-05-21 (×2): qty 2

## 2016-05-21 MED ORDER — HEPARIN SODIUM (PORCINE) 5000 UNIT/ML IJ SOLN
5000.0000 [IU] | Freq: Three times a day (TID) | INTRAMUSCULAR | Status: DC
  Administered 2016-05-22 – 2016-05-24 (×5): 5000 [IU] via SUBCUTANEOUS
  Filled 2016-05-21 (×5): qty 1

## 2016-05-21 MED ORDER — CARVEDILOL 25 MG PO TABS
25.0000 mg | ORAL_TABLET | Freq: Two times a day (BID) | ORAL | Status: DC
  Administered 2016-05-21 – 2016-05-24 (×6): 25 mg via ORAL
  Filled 2016-05-21 (×6): qty 1

## 2016-05-21 MED ORDER — ACETAMINOPHEN 325 MG PO TABS
650.0000 mg | ORAL_TABLET | ORAL | Status: DC | PRN
  Administered 2016-05-22: 650 mg via ORAL
  Filled 2016-05-21 (×2): qty 2

## 2016-05-21 MED ORDER — CALCIUM ACETATE (PHOS BINDER) 667 MG PO CAPS
2668.0000 mg | ORAL_CAPSULE | Freq: Three times a day (TID) | ORAL | Status: DC
Start: 2016-05-22 — End: 2016-05-24
  Administered 2016-05-22 – 2016-05-24 (×6): 2668 mg via ORAL
  Filled 2016-05-21 (×6): qty 4

## 2016-05-21 MED ORDER — BETAMETHASONE SOD PHOS & ACET 6 (3-3) MG/ML IJ SUSP: 3.0000 mg | Freq: Once | INTRAMUSCULAR | Status: DC

## 2016-05-21 MED ORDER — ALBUTEROL SULFATE (2.5 MG/3ML) 0.083% IN NEBU: 2.5000 mg | INHALATION_SOLUTION | RESPIRATORY_TRACT | Status: DC | PRN

## 2016-05-21 MED ORDER — ONDANSETRON HCL 4 MG/2ML IJ SOLN
4.0000 mg | Freq: Four times a day (QID) | INTRAMUSCULAR | Status: DC | PRN
  Administered 2016-05-23: 4 mg via INTRAVENOUS
  Filled 2016-05-21: qty 2

## 2016-05-21 MED ORDER — LOSARTAN POTASSIUM 50 MG PO TABS
50.0000 mg | ORAL_TABLET | Freq: Every day | ORAL | Status: DC
  Administered 2016-05-22 – 2016-05-24 (×3): 50 mg via ORAL
  Filled 2016-05-21 (×3): qty 1

## 2016-05-21 MED ORDER — OXYCODONE-ACETAMINOPHEN 5-325 MG PO TABS
1.0000 | ORAL_TABLET | ORAL | Status: DC | PRN
  Administered 2016-05-21 – 2016-05-24 (×6): 1 via ORAL
  Filled 2016-05-21 (×6): qty 1

## 2016-05-21 MED ORDER — HYDRALAZINE HCL 20 MG/ML IJ SOLN
10.0000 mg | Freq: Once | INTRAMUSCULAR | Status: AC
  Administered 2016-05-21: 10 mg via INTRAVENOUS

## 2016-05-21 MED ORDER — ALBUTEROL SULFATE HFA 108 (90 BASE) MCG/ACT IN AERS: 2.0000 | INHALATION_SPRAY | Freq: Four times a day (QID) | RESPIRATORY_TRACT | Status: DC | PRN

## 2016-05-21 MED ORDER — ASPIRIN EC 81 MG PO TBEC
81.0000 mg | DELAYED_RELEASE_TABLET | Freq: Every day | ORAL | Status: DC
Start: 2016-05-21 — End: 2016-05-24
  Administered 2016-05-21 – 2016-05-24 (×4): 81 mg via ORAL
  Filled 2016-05-21 (×5): qty 1

## 2016-05-21 MED ORDER — ROSUVASTATIN CALCIUM 20 MG PO TABS
40.0000 mg | ORAL_TABLET | Freq: Every day | ORAL | Status: DC
  Administered 2016-05-21 – 2016-05-22 (×2): 40 mg via ORAL
  Filled 2016-05-21 (×2): qty 2

## 2016-05-21 MED ORDER — RENA-VITE PO TABS
1.0000 | ORAL_TABLET | Freq: Every day | ORAL | Status: DC
  Administered 2016-05-22 – 2016-05-24 (×3): 1 via ORAL
  Filled 2016-05-21 (×3): qty 1

## 2016-05-21 MED ORDER — NITROGLYCERIN 0.4 MG SL SUBL: 0.4000 mg | SUBLINGUAL_TABLET | SUBLINGUAL | Status: DC | PRN

## 2016-05-21 MED ORDER — FUROSEMIDE 40 MG PO TABS
80.0000 mg | ORAL_TABLET | ORAL | Status: DC
  Administered 2016-05-22 – 2016-05-24 (×2): 80 mg via ORAL
  Filled 2016-05-21 (×2): qty 2

## 2016-05-21 MED ORDER — INSULIN ASPART 100 UNIT/ML ~~LOC~~ SOLN
0.0000 [IU] | Freq: Three times a day (TID) | SUBCUTANEOUS | Status: DC
  Administered 2016-05-22 (×2): 2 [IU] via SUBCUTANEOUS
  Administered 2016-05-23: 1 [IU] via SUBCUTANEOUS
  Administered 2016-05-23: 2 [IU] via SUBCUTANEOUS
  Administered 2016-05-24: 3 [IU] via SUBCUTANEOUS
  Filled 2016-05-21: qty 3
  Filled 2016-05-21 (×4): qty 2
  Filled 2016-05-21: qty 1

## 2016-05-21 MED ORDER — PREGABALIN 50 MG PO CAPS
75.0000 mg | ORAL_CAPSULE | Freq: Every day | ORAL | Status: DC
  Administered 2016-05-21 – 2016-05-24 (×4): 75 mg via ORAL
  Filled 2016-05-21 (×5): qty 1

## 2016-05-21 MED ORDER — CLONIDINE HCL 0.1 MG PO TABS
0.1000 mg | ORAL_TABLET | Freq: Two times a day (BID) | ORAL | Status: DC
  Administered 2016-05-21 – 2016-05-22 (×3): 0.1 mg via ORAL
  Filled 2016-05-21 (×3): qty 1

## 2016-05-21 MED ORDER — HYDRALAZINE HCL 20 MG/ML IJ SOLN
INTRAMUSCULAR | Status: AC
  Administered 2016-05-21: 10 mg via INTRAVENOUS
  Filled 2016-05-21: qty 1

## 2016-05-21 NOTE — ED Provider Notes (Signed)
Patient care assumed from Dr. Marcelene Butte. Patient continues with 7/10 chest pain. Patient has significant comorbidities including COPD, hypertension, end-stage renal disease on hemodialysis. Patient has a history of or near artery disease status post cardiac stents in 2014. Given the patient's significant comorbidities with continued chest pain will admit the patient to the hospital for further evaluation. I discussed this plan of care with the patient who is agreeable.   Harvest Dark, MD 05/21/16 [phone]

## 2016-05-21 NOTE — ED Notes (Signed)
Patient given ice chips and updated on plan of care

## 2016-05-21 NOTE — ED Notes (Signed)
Patient transported to X-ray 

## 2016-05-21 NOTE — ED Provider Notes (Signed)
Time Seen: Approximately 1413  I have reviewed the triage notes  Chief Complaint: Chest Pain   History of Present Illness: Theresa [name] is a 60 y.o. female *who was transported here from local dialysis center for evaluation of chest pain. Patient had approximately 40 minutes left in her treatment for dialysis today when she developed some left-sided chest discomfort. Patient had nitroglycerin prior to arrival. She still states that she has chest discomfort. She is a very reluctant historian who will not actively answer any questions that are directed. She keeps her eyes closed and will occasionally shake her head yes or no but will not volunteer a lot of information. From what I can ascertain she hasn't had any radiation of her pain. She has not had any medication that would make her drowsy at this time. She arrives with room air sats at 89% she still has her left arm accessed.   Past Medical History:  Diagnosis Date  . Anemia of chronic disease   . Carotid arterial disease ([place])    a. 02/2013 [facility]: 40-59% bilat ICA stenosis - *f/u 02/2014*  . Chronic constipation   . Chronic diastolic CHF (congestive heart failure) ([place])    a. 10/2013 [facility]): EF 55-60%, mod conc LVH, mod MR, mildly dil LA, mild Ao sclerosis w/o stenosis.  . Colon polyps   . COPD (chronic obstructive pulmonary disease) ([place])   . Coronary artery disease    a. 05/2013 NSTEMI/PCI: LM 20d, LAD min irregs, LCX small, nl, OM1 nl, RCA dom 23m (2.5x16 Promus DES), PDA1 80p.  . Diabetes mellitus   . Diabetic neuropathy ([place])   . Diabetic retinopathy ([place]) 05/28/2013   Hx bilat retinal detachment, proliferative diab retinopathy and bilat vitreous hemorrhage   . Emphysema   . ESRD on hemodialysis ([place])    a. [facility] in [place], [place], on a TTS schedule.  She started dialysis in Feb 2014.  Etiology of renal failure not known, likely diabetes.  Has a left upper arm AV graft.  . History of bronchitis    Mar 2012  . History of  pneumonia    June 2012  . History of tobacco abuse    a. Quit 2012.  Marland Kitchen Hyperlipidemia   . Hypertension   . Moderate mitral insufficiency    a. 10/2013 [facility]: EF 55-60%, mod MR.  . Peripheral vascular disease ([place])   . Sickle cell trait [facility])     Patient Active Problem List   Diagnosis Date Noted  . Mitral regurgitation 02/06/2016  . OSA on CPAP 12/20/2015  . Screening for breast cancer 07/27/2015  . Diastolic heart failure ([place]) 05/07/2015  . S/P coronary artery stent placement   . Neuritis or radiculitis due to rupture of lumbar intervertebral disc 11/28/2014  . Routine general medical examination at a health care facility 09/02/2013  . Osteoarthritis of right hip 08/23/2013  . DDD (degenerative disc disease), lumbosacral 08/23/2013  . Coronary artery disease 06/27/2013  . Diabetic retinopathy ([place]) 05/28/2013  . Hyperlipidemia   . Essential (primary) hypertension 11/08/2012  . Diabetic neuropathy ([place]) 02/18/2012  . Anemia in chronic kidney disease (CKD) 02/18/2012  . ESRD on hemodialysis ([place]) 08/21/2011  . Diabetes mellitus, type II ([place]) 06/30/2011  . COPD (chronic obstructive pulmonary disease) ([place]) 06/13/2011    Past Surgical History:  Procedure Laterality Date  . ABDOMINAL HYSTERECTOMY     2000  . CARDIAC CATHETERIZATION    . CARDIAC CATHETERIZATION N/A 05/02/2015   Procedure: Left Heart Cath  and Coronary Angiography;  Surgeon: [name], MD;  Location: [place] [facility];  Service: Cardiovascular;  Laterality: N/A;  . colonscopy    . CORONARY ANGIOPLASTY  05/28/2014   stent placement to the mid RCA  . DILATION AND CURETTAGE OF UTERUS     several in the early 80's  . ESOPHAGOGASTRODUODENOSCOPY     2012  . EYE SURGERY     bilateral laser 2012  . EYE SURGERY     right  . GAS INSERTION  09/30/2011   Procedure: INSERTION OF GAS;  Surgeon: [name], MD;  Location: [place];  Service: Ophthalmology;  Laterality: Right;  C3F8  . GAS/FLUID EXCHANGE   09/30/2011   Procedure: GAS/FLUID EXCHANGE;  Surgeon: [name], MD;  Location: [place];  Service: Ophthalmology;  Laterality: Right;  . LEFT HEART CATHETERIZATION WITH CORONARY ANGIOGRAM N/A 05/28/2013   Procedure: LEFT HEART CATHETERIZATION WITH CORONARY ANGIOGRAM;  Surgeon: [name], MD;  Location: [facility] CATH LAB;  Service: Cardiovascular;  Laterality: N/A;  . PARS PLANA VITRECTOMY  04/22/2011   Procedure: PARS PLANA VITRECTOMY WITH 25 GAUGE;  Surgeon: [name], MD;  Location: [place];  Service: Ophthalmology;  Laterality: Left;  membrane peel, endolaser, gas fluid exchange, silicone oil, repair of complex traction retinal detachment  . PARS PLANA VITRECTOMY  09/30/2011   Procedure: PARS PLANA VITRECTOMY WITH 25 GAUGE;  Surgeon: [name], MD;  Location: [place];  Service: Ophthalmology;  Laterality: Right;  Endolaser; Repair of Complex Traction Retinal Detachment  . PARS PLANA VITRECTOMY  02/24/2012   Procedure: PARS PLANA VITRECTOMY WITH 25 GAUGE;  Surgeon: [name], MD;  Location: [place];  Service: Ophthalmology;  Laterality: Left;  . PTCA    . SILICON OIL REMOVAL  1/44/8185   Procedure: SILICON OIL REMOVAL;  Surgeon: [name], MD;  Location: [place];  Service: Ophthalmology;  Laterality: Left;  . THROMBECTOMY / ARTERIOVENOUS GRAFT REVISION    . TUBAL LIGATION     1979    Past Surgical History:  Procedure Laterality Date  . ABDOMINAL HYSTERECTOMY     2000  . CARDIAC CATHETERIZATION    . CARDIAC CATHETERIZATION N/A 05/02/2015   Procedure: Left Heart Cath and Coronary Angiography;  Surgeon: [name], MD;  Location: [place] [facility];  Service: Cardiovascular;  Laterality: N/A;  . colonscopy    . CORONARY ANGIOPLASTY  05/28/2014   stent placement to the mid RCA  . DILATION AND CURETTAGE OF UTERUS     several in the early 80's  . ESOPHAGOGASTRODUODENOSCOPY     2012  . EYE SURGERY     bilateral laser 2012  . EYE SURGERY     right  . GAS  INSERTION  09/30/2011   Procedure: INSERTION OF GAS;  Surgeon: [name], MD;  Location: [place];  Service: Ophthalmology;  Laterality: Right;  C3F8  . GAS/FLUID EXCHANGE  09/30/2011   Procedure: GAS/FLUID EXCHANGE;  Surgeon: [name], MD;  Location: [place];  Service: Ophthalmology;  Laterality: Right;  . LEFT HEART CATHETERIZATION WITH CORONARY ANGIOGRAM N/A 05/28/2013   Procedure: LEFT HEART CATHETERIZATION WITH CORONARY ANGIOGRAM;  Surgeon: [name], MD;  Location: [facility] CATH LAB;  Service: Cardiovascular;  Laterality: N/A;  . PARS PLANA VITRECTOMY  04/22/2011   Procedure: PARS PLANA VITRECTOMY WITH 25 GAUGE;  Surgeon: [name], MD;  Location: [place];  Service: Ophthalmology;  Laterality: Left;  membrane peel, endolaser,  gas fluid exchange, silicone oil, repair of complex traction retinal detachment  . PARS PLANA VITRECTOMY  09/30/2011   Procedure: PARS PLANA VITRECTOMY WITH 25 GAUGE;  Surgeon: [name], MD;  Location: [place];  Service: Ophthalmology;  Laterality: Right;  Endolaser; Repair of Complex Traction Retinal Detachment  . PARS PLANA VITRECTOMY  02/24/2012   Procedure: PARS PLANA VITRECTOMY WITH 25 GAUGE;  Surgeon: [name], MD;  Location: [place];  Service: Ophthalmology;  Laterality: Left;  . PTCA    . SILICON OIL REMOVAL  5/64/3329   Procedure: SILICON OIL REMOVAL;  Surgeon: [name], MD;  Location: [place];  Service: Ophthalmology;  Laterality: Left;  . THROMBECTOMY / ARTERIOVENOUS GRAFT REVISION    . TUBAL LIGATION     1979    Current Outpatient Rx  . Order #: [id number] Class: Historical Med  . Order #: [id number] Class: Historical Med  . Order #: [id number] Class: Historical Med  . Order #: [id number] Class: Historical Med  . Order #: [id number] Class: Normal  . Order #: [id number] Class: Normal  . Order #: [id number] Class: Normal  . Order #: [id number] Class: Historical Med  . Order #: [id number] Class: Normal  . Order #: [id number] Class: Historical  Med  . Order #: [id number] Class: Normal  . Order #: [id number] Class: Normal  . Order #: [id number] Class: Historical Med  . Order #: [id number] Class: Print    Allergies:  Hydrocodone and Metformin  Family History: Family History  Problem Relation Age of Onset  . Stroke Mother   . Heart attack Mother   . Heart disease Mother   . Colon cancer Father   . Colon cancer Sister   . Heart attack Brother   . Stroke Brother   . Diabetes Brother   . Breast cancer Sister   . Diabetes Brother   . Diabetes Daughter   . Anesthesia problems Neg Hx   . Hypotension Neg Hx   . Malignant hyperthermia Neg Hx   . Pseudochol deficiency Neg Hx     Social History: Social History  Substance Use Topics  . Smoking status: Former Smoker    Packs/day: 1.00    Years: 25.00    Quit date: 06/09/2010  . Smokeless tobacco: Never Used  . Alcohol use No     Review of Systems:   10 point review of systems was performed and was otherwise negative: Difficult review of systems as the patient will only nod her head on occasion whenever she is asked to question. Constitutional: No fever Eyes: No visual disturbances ENT: No sore throat, ear pain Cardiac: Chest pain left-sided. Unknown characteristic or radiation Respiratory: No shortness of breath, wheezing, or stridor Abdomen: No abdominal pain, no vomiting, No diarrhea Endocrine: No weight loss, No night sweats Extremities: No peripheral edema, cyanosis Skin: No rashes, easy bruising Neurologic: No focal weakness, trouble with speech or swollowing Urologic: No dysuria, Hematuria, or urinary frequency **  Physical Exam:  ED Triage Vitals  Enc Vitals Group     BP 05/21/16 1400 (!) 184/62     Pulse Rate 05/21/16 1400 75     Resp 05/21/16 1400 16     Temp 05/21/16 1400 98.2 F (36.8 C)     Temp Source 05/21/16 1400 Oral     SpO2 05/21/16 1400 (!) 89 %     Weight 05/21/16 1400 172 lb (78 kg)     Height 05/21/16 1400 5' 7.5" (1.715 m)     Head  Circumference --      Peak Flow --  Pain Score 05/21/16 1401 6     Pain Loc --      Pain Edu? --      Excl. in [place]? --     General: Awake , Alert , and Oriented times 3; GCS 15 Head: Normal cephalic , atraumatic Eyes: Pupils equal , round, reactive to light Nose/Throat: No nasal drainage, patent upper airway without erythema or exudate.  Neck: Supple, Full range of motion, No anterior adenopathy or palpable thyroid masses Lungs: Clear to ascultation without wheezes , rhonchi, or rales Heart: Regular rate, regular rhythm without murmurs , gallops , or rubs Abdomen: Soft, non tender without rebound, guarding , or rigidity; bowel sounds positive and symmetric in all 4 quadrants. No organomegaly .        Extremities: 2 plus symmetric pulses. No edema, clubbing or cyanosis Neurologic: normal ambulation, Motor symmetric without deficits, sensory intact Skin: warm, dry, no rashes Some reproducible left-sided chest discomfort to palpation.  Labs:   All laboratory work was reviewed including any pertinent negatives or positives listed below:  Labs Reviewed  BASIC METABOLIC PANEL - Abnormal; Notable for the following:       Result Value   Chloride 98 (*)    Glucose, Bld 232 (*)    BUN 27 (*)    Creatinine, Ser 4.98 (*)    Calcium 8.4 (*)    GFR calc non Af Amer 9 (*)    GFR calc Af Amer 10 (*)    All other components within normal limits  CBC - Abnormal; Notable for the following:    RBC 3.37 (*)    Hemoglobin 11.5 (*)    HCT 33.6 (*)    RDW 16.2 (*)    All other components within normal limits  TROPONIN I  TROPONIN I    EKG: ED ECG REPORT I, Daymon Larsen, the attending physician, personally viewed and interpreted this ECG.  Date: 05/21/2016 EKG Time: *1358 Rate: 76Rhythm: normal sinus rhythm QRS Axis: normal Intervals: normal ST/T Wave abnormalities: normal Conduction Disturbances: none Narrative Interpretation: unremarkable Left ventricular hypertrophy with  repolarization No acute ischemic changes  Radiology: * "Dg Chest 2 View  Result Date: 05/21/2016 CLINICAL DATA:  60 year old female with a history of left-sided chest pain EXAM: CHEST  2 VIEW COMPARISON:  02/22/2016, 12/30/2015 FINDINGS: Cardiomediastinal silhouette unchanged. Calcifications of the aortic arch. Overall aeration is improved when compared to the most recent chest x-ray. No confluent airspace disease or pneumothorax. No evidence of pleural effusion. Coarsened appearance of the interstitium with low lung volumes. No displaced fracture. IMPRESSION: Improved aeration of the lungs compared to the most recent chest x-ray of September, with coarsened interstitium potentially representing chronic changes. No evidence of lobar pneumonia or pleural effusion. Aortic atherosclerosis. Signed, Dulcy Fanny. Earleen Newport, DO [facility] Radiology Electronically Signed   By: Corrie Mckusick D.O.   On: 05/21/2016 14:50  "  I personally reviewed the radiologic studies   ED Course:  The patient's serum initial labs and EKG showed no obvious ischemic changes. Reviewing her records shows that she did have a heart catheterization in 2016 which did not show any active disease only chronic and a single vessel where she's had a previous stent. She had some form of cardiac evaluation done at [facility]. Patient received a second troponin. Initial EKG here does not show any maladies concerning for acute coronary syndrome. This may be secondary to musculoskeletal pain based on the reproducible nature of her discomfort. Clinical  Course      Assessment: * Acute unspecified chest pain    Plan:  Repeat troponin, likely discharge.            Daymon Larsen, MD 05/21/16 [phone]

## 2016-05-21 NOTE — ED Notes (Signed)
Patient transported to dialysis

## 2016-05-21 NOTE — H&P (Signed)
[facility] NAME: Theresa [name]    MR#:  [id number]  DATE OF BIRTH:  [birth year]  DATE OF ADMISSION:  05/21/2016  PRIMARY CARE PHYSICIAN: Mable Paris, FNP   REQUESTING/REFERRING PHYSICIAN: Paduchowski, MD  CHIEF COMPLAINT:  Chest pain   HISTORY OF PRESENT ILLNESS:  Theresa [name]  is a 60 y.o. female with a known history of Chronic COPD, diabetes mellitus not on insulin, end-stage renal disease on hemodialysis which was incompletely done today because of the chest pain is sent over to the emergency department. Patient reports that she started having midsternal chest pain while she was getting hemodialysis. Here in the emergency department troponin is negative. EKG with no significant changes. Recent echocardiogram was done in September 2017 has revealed ejection fraction 55-60%. Patient is Dr. Rockey Situ as an outpatient. Patient had coronary artery disease status post stent placement in December 2014  PAST MEDICAL HISTORY:   Past Medical History:  Diagnosis Date  . Anemia of chronic disease   . Carotid arterial disease ([place])    a. 02/2013 U/S: 40-59% bilat ICA stenosis - *f/u 02/2014*  . Chronic constipation   . Chronic diastolic CHF (congestive heart failure) ([place])    a. 10/2013 Echo [facility]): EF 55-60%, mod conc LVH, mod MR, mildly dil LA, mild Ao sclerosis w/o stenosis.  . Colon polyps   . COPD (chronic obstructive pulmonary disease) ([place])   . Coronary artery disease    a. 05/2013 NSTEMI/PCI: LM 20d, LAD min irregs, LCX small, nl, OM1 nl, RCA dom 68m (2.5x16 Promus DES), PDA1 80p.  . Diabetes mellitus   . Diabetic neuropathy ([place])   . Diabetic retinopathy ([place]) 05/28/2013   Hx bilat retinal detachment, proliferative diab retinopathy and bilat vitreous hemorrhage   . Emphysema   . ESRD on hemodialysis ([place])    a. [facility] in [place], [place], on a TTS schedule.  She started dialysis in Feb 2014.  Etiology of renal failure not  known, likely diabetes.  Has a left upper arm AV graft.  . History of bronchitis    Mar 2012  . History of pneumonia    June 2012  . History of tobacco abuse    a. Quit 2012.  Marland Kitchen Hyperlipidemia   . Hypertension   . Moderate mitral insufficiency    a. 10/2013 Echo: EF 55-60%, mod MR.  . Peripheral vascular disease ([place])   . Sickle cell trait ([place])     PAST SURGICAL HISTOIRY:   Past Surgical History:  Procedure Laterality Date  . ABDOMINAL HYSTERECTOMY     2000  . CARDIAC CATHETERIZATION    . CARDIAC CATHETERIZATION N/A 05/02/2015   Procedure: Left Heart Cath and Coronary Angiography;  Surgeon: [name], MD;  Location: [place] CV LAB;  Service: Cardiovascular;  Laterality: N/A;  . colonscopy    . CORONARY ANGIOPLASTY  05/28/2014   stent placement to the mid RCA  . DILATION AND CURETTAGE OF UTERUS     several in the early 80's  . ESOPHAGOGASTRODUODENOSCOPY     2012  . EYE SURGERY     bilateral laser 2012  . EYE SURGERY     right  . GAS INSERTION  09/30/2011   Procedure: INSERTION OF GAS;  Surgeon: [name], MD;  Location: [place];  Service: Ophthalmology;  Laterality: Right;  [id number]  . GAS/FLUID EXCHANGE  09/30/2011   Procedure: GAS/FLUID EXCHANGE;  Surgeon: [name], MD;  Location: [place];  Service: Ophthalmology;  Laterality: Right;  . LEFT HEART CATHETERIZATION WITH CORONARY ANGIOGRAM N/A 05/28/2013   Procedure: LEFT HEART CATHETERIZATION WITH CORONARY ANGIOGRAM;  Surgeon: [name], MD;  Location: [facility] CATH LAB;  Service: Cardiovascular;  Laterality: N/A;  . PARS PLANA VITRECTOMY  04/22/2011   Procedure: PARS PLANA VITRECTOMY WITH 25 GAUGE;  Surgeon: [name], MD;  Location: [place];  Service: Ophthalmology;  Laterality: Left;  membrane peel, endolaser, gas fluid exchange, silicone oil, repair of complex traction retinal detachment  . PARS PLANA VITRECTOMY  09/30/2011   Procedure: PARS PLANA VITRECTOMY WITH 25 GAUGE;  Surgeon: [name], MD;  Location: [place];  Service: Ophthalmology;  Laterality: Right;  Endolaser; Repair of Complex Traction Retinal Detachment  . PARS PLANA VITRECTOMY  02/24/2012   Procedure: PARS PLANA VITRECTOMY WITH 25 GAUGE;  Surgeon: [name], MD;  Location: [place];  Service: Ophthalmology;  Laterality: Left;  . PTCA    . SILICON OIL REMOVAL  3/81/0175   Procedure: SILICON OIL REMOVAL;  Surgeon: [name], MD;  Location: [place];  Service: Ophthalmology;  Laterality: Left;  . THROMBECTOMY / ARTERIOVENOUS GRAFT REVISION    . TUBAL LIGATION     1979    SOCIAL HISTORY:   Social History  Substance Use Topics  . Smoking status: Former Smoker    Packs/day: 1.00    Years: 25.00    Quit date: 06/09/2010  . Smokeless tobacco: Never Used  . Alcohol use No    FAMILY HISTORY:   Family History  Problem Relation Age of Onset  . Stroke Mother   . Heart attack Mother   . Heart disease Mother   . Colon cancer Father   . Colon cancer Sister   . Heart attack Brother   . Stroke Brother   . Diabetes Brother   . Breast cancer Sister   . Diabetes Brother   . Diabetes Daughter   . Anesthesia problems Neg Hx   . Hypotension Neg Hx   . Malignant hyperthermia Neg Hx   . Pseudochol deficiency Neg Hx     DRUG ALLERGIES:   Allergies  Allergen Reactions  . Hydrocodone Hives  . Metformin Diarrhea    REVIEW OF SYSTEMS:  CONSTITUTIONAL: No fever, fatigue or weakness.  EYES: No blurred or double vision.  EARS, NOSE, AND THROAT: No tinnitus or ear pain.  RESPIRATORY: No cough, shortness of breath, wheezing or hemoptysis.  CARDIOVASCULAR: Reporting midsternal chest pain, denies orthopnea, edema.  GASTROINTESTINAL: No nausea, vomiting, diarrhea or abdominal pain.  GENITOURINARY: No dysuria, hematuria.  ENDOCRINE: No polyuria, nocturia,  HEMATOLOGY: No anemia, easy bruising or bleeding SKIN: No rash or lesion. MUSCULOSKELETAL: No joint pain or arthritis.   NEUROLOGIC: No tingling,  numbness, weakness.  PSYCHIATRY: No anxiety or depression.   MEDICATIONS AT HOME:   Prior to Admission medications   Medication Sig Start Date End Date Taking? Authorizing Provider  aspirin EC 81 MG tablet Take 81 mg by mouth daily.   Yes Historical Provider, MD  calcium acetate (PHOSLO) 667 MG capsule Take [phone] mg by mouth 3 (three) times daily with meals. Take 5 capsules if you have anything with cheese, otherwise, take 4 capsules if you don't have cheese.   Yes Historical Provider, MD  carvedilol (COREG) 25 MG tablet Take 1 tablet (25 mg total) by mouth 2 (two) times daily with a meal. 02/23/16  Yes Sital Mody, MD  cloNIDine (CATAPRES) 0.1 MG tablet Take 1 tablet (  0.1 mg total) by mouth 2 (two) times daily. 01/30/15  Yes Minna Merritts, MD  CRESTOR 40 MG tablet TAKE ONE TABLET BY MOUTH ONCE DAILY 08/09/15  Yes Minna Merritts, MD  furosemide (LASIX) 80 MG tablet Take 80 mg by mouth daily. Only on Saturdays, Sundays, Tuesdays and Thursdays. (non-dialysis days)   Yes Historical Provider, MD  losartan (COZAAR) 50 MG tablet Take 1 tablet (50 mg total) by mouth daily. 03/24/16  Yes Alisa Graff, FNP  multivitamin (RENA-VIT) TABS tablet Take 1 tablet by mouth daily. 03/24/16  Yes Alisa Graff, FNP  pregabalin (LYRICA) 75 MG capsule Take 1 capsule (75 mg total) by mouth daily. 04/21/16  Yes Burnard Hawthorne, FNP  albuterol (PROVENTIL HFA;VENTOLIN HFA) 108 (90 BASE) MCG/ACT inhaler Inhale 2 puffs into the lungs every 6 (six) hours as needed for wheezing or shortness of breath.    Historical Provider, MD  albuterol (PROVENTIL) (2.5 MG/3ML) 0.083% nebulizer solution Take 2.5 mg by nebulization every 4 (four) hours as needed for wheezing or shortness of breath.    Historical Provider, MD  Melatonin 1 MG CAPS Take 1 capsule by mouth daily.    Historical Provider, MD  nitroGLYCERIN (NITROSTAT) 0.4 MG SL tablet Place 1 tablet (0.4 mg total) under the tongue every 5 (five) minutes as needed for  chest pain. 09/26/15   Alisa Graff, FNP      VITAL SIGNS:  Blood pressure (!) 194/65, pulse 66, temperature 98.2 F (36.8 C), temperature source Oral, resp. rate 18, height 5' 7.5" (1.715 m), weight 78 kg (172 lb), SpO2 98 %.  PHYSICAL EXAMINATION:  GENERAL:  60 y.o.-year-old patient lying in the bed with no acute distress.  EYES: Pupils equal, round, reactive to light and accommodation. No scleral icterus. Extraocular muscles intact.  HEENT: Head atraumatic, normocephalic. Oropharynx and nasopharynx clear.  NECK:  Supple, no jugular venous distention. No thyroid enlargement, no tenderness.  LUNGS: Normal breath sounds bilaterally, no wheezing, rales,rhonchi or crepitation. No use of accessory muscles of respiration.  CARDIOVASCULAR: S1, S2 normal. No murmurs, rubs, or gallops. Reproducible midsternal chest wall tenderness ABDOMEN: Soft, nontender, nondistended. Bowel sounds present. No organomegaly or mass.  EXTREMITIES: No pedal edema, cyanosis, or clubbing.  NEUROLOGIC: Cranial nerves II through XII are intact. Muscle strength 5/5 in all extremities. Sensation intact. Gait not checked.  PSYCHIATRIC: The patient is alert and oriented x 3.  SKIN: No obvious rash, lesion, or ulcer.   LABORATORY PANEL:   CBC  Recent Labs Lab 05/21/16 1405  WBC 7.5  HGB 11.5*  HCT 33.6*  PLT 155   ------------------------------------------------------------------------------------------------------------------  Chemistries   Recent Labs Lab 05/21/16 1405  NA 138  K 4.1  CL 98*  CO2 31  GLUCOSE 232*  BUN 27*  CREATININE 4.98*  CALCIUM 8.4*   ------------------------------------------------------------------------------------------------------------------  Cardiac Enzymes  Recent Labs Lab 05/21/16 1655  TROPONINI 0.03*   ------------------------------------------------------------------------------------------------------------------  RADIOLOGY:  Dg Chest 2 View  Result  Date: 05/21/2016 CLINICAL DATA:  60 year old female with a history of left-sided chest pain EXAM: CHEST  2 VIEW COMPARISON:  02/22/2016, 12/30/2015 FINDINGS: Cardiomediastinal silhouette unchanged. Calcifications of the aortic arch. Overall aeration is improved when compared to the most recent chest x-ray. No confluent airspace disease or pneumothorax. No evidence of pleural effusion. Coarsened appearance of the interstitium with low lung volumes. No displaced fracture. IMPRESSION: Improved aeration of the lungs compared to the most recent chest x-ray of September, with coarsened interstitium potentially representing chronic changes.  No evidence of lobar pneumonia or pleural effusion. Aortic atherosclerosis. Signed, Dulcy Fanny. Earleen Newport, DO [facility] Electronically Signed   By: Corrie Mckusick D.O.   On: 05/21/2016 14:50    EKG:   Orders placed or performed during the hospital encounter of 05/21/16  . ED EKG within 10 minutes  . ED EKG within 10 minutes    IMPRESSION AND PLAN:   Theresa [name]  is a 60 y.o. female with a known history of Chronic COPD, diabetes mellitus not on insulin, end-stage renal disease on hemodialysis which was incompletely done today because of the chest pain is sent over to the emergency department. Patient reports that she started having midsternal chest pain while she was getting hemodialysis. Here in the emergency department troponin is negative  # atypical chest pain with history of coronary artery disease status post stent placement Seems to be musculoskeletal as it is reproducible Telemetry Cycle cardiac biomarkers Recent echocardiogram has revealed 55-60% ejection fraction in September 2017 Continue her home medication aspirin, Coreg, Crestor Cardiology consult is placed- [facility]  #End stage renal disease on hemodialysis Monday, Wednesday, Friday Patient had incomplete hemodialysis today which  was discontinued in the middle because of the chest pain Not fluid overloaded at this time Nephrology consult placed  #Diabetes mellitus Diabetic diet and sliding scale insulin  #Hypertensive urgency Resume home medications clonidine, Coreg and Lasix and titrate as needed  #Chronic COPD no exacerbation Continue home medications including inhalers  All the records are reviewed and case discussed with ED provider. Management plans discussed with the patient, family and they are in agreement.  CODE STATUS: fc, daughter is the healthcare power of attorney  TOTAL TIME TAKING CARE OF THIS PATIENT: 45 minutes.   Note: This dictation was prepared with Dragon dictation along with smaller phrase technology. Any transcriptional errors that result from this process are unintentional.  Nicholes Mango M.D on 05/21/2016 at 5:52 PM  Between 7am to 6pm - Pager - [phone]  After 6pm go to [email or web address] - password [email or web address] [facility]  Office  [phone]  CC: Primary care physician; Mable Paris, FNP

## 2016-05-21 NOTE — Progress Notes (Signed)
Patient arrived to 2A Room 258. Patient denies pain and all questions answered. Patient oriented to unit and Fall Safety Plan signed. Skin assessment completed with Vincente Liberty RN and skin intact. A&Ox4, VSS, and NSR on verified tele-box #[id number]. Nursing staff will continue to monitor for any changes in patient status. Earleen Reaper, RN

## 2016-05-21 NOTE — ED Notes (Signed)
Spoke with dialysis in regards to having patient de-accessed.  Given dialysis team my direct line so they can call me with an estimated time.

## 2016-05-21 NOTE — ED Triage Notes (Signed)
Pt comes into the ED via EMS from [facility] where she started having chest pain with 40 minutes left in her treatment.  3 nitro's total given to patient.  173/72, CBG 281, 92% 2l nasal cannula.  Patient presents lethargic and states she was initially nauseas.  Patient is still accessed on her left arm from dialysis.

## 2016-05-21 NOTE — Progress Notes (Signed)
Patient was ordered lovenox 30mg  daily. Pt is on HD, therefore per protocol, pt was transitioned to heparin subq  Shante Maysonet D Kavan Devan, Pharm.D, BCPS Clinical Pharmacist

## 2016-05-21 NOTE — ED Notes (Signed)
ED Provider at bedside. 

## 2016-05-22 ENCOUNTER — Observation Stay: Payer: Medicare Other

## 2016-05-22 DIAGNOSIS — N186 End stage renal disease: Secondary | ICD-10-CM | POA: Diagnosis not present

## 2016-05-22 DIAGNOSIS — R55 Syncope and collapse: Secondary | ICD-10-CM | POA: Diagnosis not present

## 2016-05-22 DIAGNOSIS — I6523 Occlusion and stenosis of bilateral carotid arteries: Secondary | ICD-10-CM | POA: Diagnosis not present

## 2016-05-22 DIAGNOSIS — R079 Chest pain, unspecified: Secondary | ICD-10-CM | POA: Diagnosis not present

## 2016-05-22 DIAGNOSIS — N2581 Secondary hyperparathyroidism of renal origin: Secondary | ICD-10-CM | POA: Diagnosis not present

## 2016-05-22 DIAGNOSIS — R0789 Other chest pain: Secondary | ICD-10-CM

## 2016-05-22 DIAGNOSIS — I10 Essential (primary) hypertension: Secondary | ICD-10-CM

## 2016-05-22 LAB — LIPID PANEL
CHOL/HDL RATIO: 2.7 ratio
Cholesterol: 161 mg/dL (ref 0–200)
HDL: 59 mg/dL (ref >40)
LDL Cholesterol: 89 mg/dL (ref 0–99)
TRIGLYCERIDES: 65 mg/dL (ref <150)
VLDL: 13 mg/dL (ref 0–40)

## 2016-05-22 LAB — GLUCOSE, CAPILLARY
GLUCOSE-CAPILLARY: 237 mg/dL — AB (ref 65–99)
Glucose-Capillary: 164 mg/dL — ABNORMAL HIGH (ref 65–99)
Glucose-Capillary: 169 mg/dL — ABNORMAL HIGH (ref 65–99)
Glucose-Capillary: 106 mg/dL — ABNORMAL HIGH (ref 65–99)

## 2016-05-22 LAB — TROPONIN I: TROPONIN I: 0.03 ng/mL — AB (ref <0.03)

## 2016-05-22 LAB — TSH: TSH: 1.685 u[IU]/mL (ref 0.350–4.500)

## 2016-05-22 MED ORDER — ISOSORBIDE MONONITRATE ER 30 MG PO TB24: 30.0000 mg | ORAL_TABLET | Freq: Every day | ORAL | Status: DC

## 2016-05-22 MED ORDER — ISOSORBIDE MONONITRATE ER 60 MG PO TB24: 60.0000 mg | ORAL_TABLET | Freq: Every day | ORAL | Status: DC

## 2016-05-22 MED ORDER — ISOSORBIDE MONONITRATE ER 60 MG PO TB24
60.0000 mg | ORAL_TABLET | Freq: Every day | ORAL | Status: DC
  Administered 2016-05-22 – 2016-05-24 (×3): 60 mg via ORAL
  Filled 2016-05-22 (×3): qty 1

## 2016-05-22 MED ORDER — IBUPROFEN 400 MG PO TABS
600.0000 mg | ORAL_TABLET | Freq: Four times a day (QID) | ORAL | Status: DC | PRN
  Administered 2016-05-22 – 2016-05-24 (×2): 600 mg via ORAL
  Filled 2016-05-22 (×2): qty 2

## 2016-05-22 NOTE — Progress Notes (Signed)
Subjective:   Patient sent from [place] [facility] for sudden hypotension and possible syncope. Patient states she was undergoing routine dialysis when this happened. She does not remember the complete episode. At the time she felt intense chest pressure but not pain At present she reports chest pressure has resolves. Chest pain is at the sternal area and it is reproducible  Objective:  Vital signs in last 24 hours:  Temp:  [97.8 F (36.6 C)-98.1 F (36.7 C)] 98.1 F (36.7 C) (12/14 0829) Pulse Rate:  [67-78] 67 (12/14 1816) Resp:  [16] 16 (12/14 0829) BP: (163-190)/(49-58) 167/56 (12/14 1816) SpO2:  [92 %-100 %] 97 % (12/14 1816)  Weight change:  Filed Weights   05/21/16 1400 05/21/16 2002  Weight: 78 kg (172 lb) 76.2 kg (168 lb 1.6 oz)    Intake/Output:    Intake/Output Summary (Last 24 hours) at 05/22/16 2146 Last data filed at 05/22/16 1924  Gross per 24 hour  Intake              960 ml  Output               50 ml  Net              910 ml     Physical Exam: General: NAD,   HEENT Anicteric, moist mucus membranes  Neck supple  Pulm/lungs Clear b/l  CVS/Heart No rub  Abdomen:  Soft, NT  Extremities: No edema  Neurologic: Alert, oriented  Skin: No rashes  Access: Left arm AVF       Basic Metabolic Panel:   Recent Labs Lab 05/21/16 1405  NA 138  K 4.1  CL 98*  CO2 31  GLUCOSE 232*  BUN 27*  CREATININE 4.98*  CALCIUM 8.4*     CBC:  Recent Labs Lab 05/21/16 1405  WBC 7.5  HGB 11.5*  HCT 33.6*  MCV 99.6  PLT 155      Microbiology:  Recent Results (from the past 720 hour(s))  MRSA PCR Screening     Status: None   Collection Time: 05/21/16  8:23 PM  Result Value Ref Range Status   MRSA by PCR NEGATIVE NEGATIVE Final    Comment:        The GeneXpert MRSA Assay (FDA approved for NASAL specimens only), is one component of a comprehensive MRSA colonization surveillance program. It is not intended to diagnose MRSA infection nor to guide  or monitor treatment for MRSA infections.     Coagulation Studies: No results for input(s): LABPROT, INR in the last 72 hours.  Urinalysis: No results for input(s): COLORURINE, LABSPEC, PHURINE, GLUCOSEU, HGBUR, BILIRUBINUR, KETONESUR, PROTEINUR, UROBILINOGEN, NITRITE, LEUKOCYTESUR in the last 72 hours.  Invalid input(s): APPERANCEUR    Imaging: Dg Chest 2 View  Result Date: 05/21/2016 CLINICAL DATA:  60 year old female with a history of left-sided chest pain EXAM: CHEST  2 VIEW COMPARISON:  02/22/2016, 12/30/2015 FINDINGS: Cardiomediastinal silhouette unchanged. Calcifications of the aortic arch. Overall aeration is improved when compared to the most recent chest x-ray. No confluent airspace disease or pneumothorax. No evidence of pleural effusion. Coarsened appearance of the interstitium with low lung volumes. No displaced fracture. IMPRESSION: Improved aeration of the lungs compared to the most recent chest x-ray of September, with coarsened interstitium potentially representing chronic changes. No evidence of lobar pneumonia or pleural effusion. Aortic atherosclerosis. Signed, Dulcy Fanny. Earleen Newport, DO [facility] Electronically Signed   By: Corrie Mckusick D.O.   On:  05/21/2016 14:50   Ct Head Wo Contrast  Result Date: 05/22/2016 CLINICAL DATA:  Syncope yesterday with nausea during dialysis. EXAM: CT HEAD WITHOUT CONTRAST TECHNIQUE: Contiguous axial images were obtained from the base of the skull through the vertex without intravenous contrast. COMPARISON:  Multiple exams, including 11/23/2014 FINDINGS: Brain: The brainstem, cerebellum, cerebral peduncles, thalami, basal ganglia, basilar cisterns, and ventricular system appear within normal limits. No intracranial hemorrhage, mass lesion, or acute CVA. Chronic an probably physiologic calcification of the globus pallidus nuclei observed. Periventricular white matter and corona radiata  hypodensities favor chronic ischemic microvascular white matter disease. Vascular: There is atherosclerotic calcification of the cavernous carotid arteries bilaterally. Skull: Unremarkable Sinuses/Orbits: Unremarkable Other: No supplemental non-categorized findings. IMPRESSION: 1. No acute intracranial findings. 2. Periventricular white matter and corona radiata hypodensities favor chronic ischemic microvascular white matter disease. Electronically Signed   By: Van Clines M.D.   On: 05/22/2016 11:40   US Carotid Bilateral  Result Date: 05/22/2016 CLINICAL DATA:  Syncope EXAM: BILATERAL CAROTID DUPLEX ULTRASOUND TECHNIQUE: Pearline Cables scale imaging, color Doppler and duplex ultrasound were performed of bilateral carotid and vertebral arteries in the neck. COMPARISON:  None. FINDINGS: Criteria: Quantification of carotid stenosis is based on velocity parameters that correlate the residual internal carotid diameter with NASCET-based stenosis levels, using the diameter of the distal internal carotid lumen as the denominator for stenosis measurement. The following velocity measurements were obtained: RIGHT ICA:  134 cm/sec CCA:  89 cm/sec SYSTOLIC ICA/CCA RATIO:  1.6 DIASTOLIC ICA/CCA RATIO:  2.2 ECA:  195 cm/sec LEFT ICA:  155 cm/sec CCA:  89 cm/sec SYSTOLIC ICA/CCA RATIO:  0.7 DIASTOLIC ICA/CCA RATIO:  2.0 ECA:  231 cm/sec RIGHT CAROTID ARTERY: Moderate focal calcified plaque in the bulb. This causes luminal narrowing on color Doppler imaging. Doppler analysis demonstrates a low resistance pattern. RIGHT VERTEBRAL ARTERY:  Antegrade. LEFT CAROTID ARTERY: Moderate focal calcified plaque in the bulb. Low resistance internal carotid Doppler pattern. LEFT VERTEBRAL ARTERY:  Antegrade. IMPRESSION: 50-69% stenosis in the right and left internal carotid arteries. Vertebral arteries are antegrade in flow. Electronically Signed   By: Marybelle Killings M.D.   On: 05/22/2016 12:41     Medications:    . aspirin EC  81 mg  Oral Daily  . calcium acetate  [id number] mg Oral TID WC  . carvedilol  25 mg Oral BID WC  . cloNIDine  0.1 mg Oral BID  . furosemide  80 mg Oral Once per day on Sun Tue Thu Sat  . heparin subcutaneous  5,000 Units Subcutaneous Q8H  . insulin aspart  0-5 Units Subcutaneous QHS  . insulin aspart  0-9 Units Subcutaneous TID WC  . isosorbide mononitrate  60 mg Oral Daily  . losartan  50 mg Oral Daily  . multivitamin  1 tablet Oral Daily  . pregabalin  75 mg Oral Daily  . rosuvastatin  40 mg Oral QHS   acetaminophen, albuterol, ibuprofen, Melatonin, nitroGLYCERIN, ondansetron (ZOFRAN) IV, oxyCODONE-acetaminophen  Assessment/ Plan:  60 y.o.African American female  with history of diabetes type 2 with severe complications including diabetic retinopathy, neuropathy,nephropathy, long standing hypertension, COPD, hyperlipidemia, congestive heart failure  with EF 45-50%  MWF [place].   1. HTN 2. ESRD 3. SHPTH 4. Syncopal episode  Plan: Dialysis tomorrow BP high inpatient - mostly isolated systolic Echo from sep show Mild AS and mod to severe MR.  Home meds restarted Will follow    LOS: 0 Lyndzie Zentz 12/14/20179:46 PM

## 2016-05-22 NOTE — Consult Note (Signed)
Cardiology Consultation Note  Patient ID: Theresa [name], MRN: [id number], DOB/AGE: [birth year] 60 y.o. Admit date: 05/21/2016   Date of Consult: 05/22/2016 Primary Physician: Mable Paris, [place] Primary Cardiologist: Dr. Rockey Situ, MD Requesting Physician: Dr. Margaretmary Eddy, MD  Chief Complaint: Aphasia/atypical chest pain Reason for Consult: Atypical chest pain  HPI: 60 y.o. female with h/o history of CAD s/p PCI/DES to mid RCA in 05/2013, chronic combined systolic and diastolic CHF/ICM now with normalized EF by echo 11/2014, moderate to severe MR, ESRD on HD (MWF), prior tobacco abuse, bilateral carotid artery disease/PVD, HTN, COPD, anemia of chronic disease, and DM2 with diabetic neuropathy/retinopathy who presents for follow up.   She was admitted in 05/2013 for PNA. She was found to have elevated cardiac enzymes and underwent cardiac cath that showed 95% mid RCA stenosis s/p PCI/DES with 2.5 x 16 mm Promus stent. Prior echo from 11/10/2010 showed EF 45-50%, mildly dilated LV, mild MS, moderate to severe MR, mild to moderate TR. Most recent echo from 11/24/2014 showed an EF of 60-65%, normal wall motion, GR2DD, moderate MR, mildly dilated left atrium.   Prior stress test on 04/17/2015 as part of her preoperative evaluation for possible renal transplant that showed a moderate severity reversible defect of moderate size of the inferior wall c/w ischemia. There was also a defect noted in the apical, septal, anteroseptal region. EF 60%. Because of her abnormal stress test she underwent LHC on 05/02/2015 that showed significant one-vessel CAD with patent stent in the mid RCA. There was 70% ostial RPDA stenosis which was unchanged from prior cardiac cath in 2014. A faint collateral was noted from right to left supplying what seemed to be a small septal branch of the LAD. The left coronary tree had mild nonobstructive disease. Normal LV systolic function with severely elevated BP with moderately elevated LVEDP. It  was felt her dyspnea was 2/2 chronic diastolic CHF with uncontrolled HTN. Medical management was advised.  Patient was undergoing HD on 12/13 when she became hot and had a brief episode of aphasia lasting a couple seconds. She then stated her chest hurt if she pushed on it. She was brought to [facility] where she was noted to be markedly hypertensive with SBP > 200 mmHg.   Recently admission in 11.2016 in September 2017 for similar issue of SOB and atypical chest pain in the setting of malignant hypertension.   Recent nuclear stress test through [facility] 03/2016 improved from prior study.  Upon the patient's arrival to [facility] they were found to have negative troponin x 4, SBP > 200 mmHg, K+ 4.1, hbg 11.5, plt 155, wbc 7.5. ECG nonacute, CXR showed improved aeration of the lungs without evidence of pleural effusion. Currently asymptomatic unless she pushes on her chest.   Past Medical History:  Diagnosis Date  . Anemia of chronic disease   . Carotid arterial disease ([place])    a. 02/2013 U/S: 40-59% bilat ICA stenosis - *f/u 02/2014*  . Chronic constipation   . Chronic diastolic CHF (congestive heart failure) ([place])    a. 10/2013 Echo [facility]): EF 55-60%, mod conc LVH, mod MR, mildly dil LA, mild Ao sclerosis w/o stenosis.  . Colon polyps   . COPD (chronic obstructive pulmonary disease) ([place])   . Coronary artery disease    a. 05/2013 NSTEMI/PCI: LM 20d, LAD min irregs, LCX small, nl, OM1 nl, RCA dom 53m (2.5x16 Promus DES), PDA1 80p.  . Diabetes mellitus   . Diabetic neuropathy ([place])   . Diabetic retinopathy ([place])  05/28/2013   Hx bilat retinal detachment, proliferative diab retinopathy and bilat vitreous hemorrhage   . Emphysema   . ESRD on hemodialysis ([place])    a. [facility] in [place], [place], on a TTS schedule.  She started dialysis in Feb 2014.  Etiology of renal failure not known, likely diabetes.  Has a left upper arm AV graft.  . History of bronchitis    Mar 2012  . History of pneumonia    June 2012  .  History of tobacco abuse    a. Quit 2012.  Marland Kitchen Hyperlipidemia   . Hypertension   . Moderate mitral insufficiency    a. 10/2013 Echo: EF 55-60%, mod MR.  . Peripheral vascular disease ([place])   . Sickle cell trait ([place])       Most Recent Cardiac Studies: Stress test 03/18/2016: Impressions: - There is a small in size, subtle in severity, minimally reversible  defect involving the apical inferior and mid inferior segments. This may  representsubtle scar/ischemia but cannot rule out artifact  (subdiaphragmatic activity). - Post stress: Global systolic function is normal. The ejection fraction  calculated at 59%.  - Coronary calcifications are noted - Compared to the study of 04/27/15, the previous anterior defect is not  evident. The previously reported inferior abnormalities are less apparent. - Incidentally noted on CT are a right breast soft tissue mass and a  sub-centimeter right lung nodule, both grossly unchanged from previous  Study.  Cardiac cath 04/2015: Conclusion    The left ventricular systolic function is normal.  Mid LAD-1 lesion, 30% stenosed.  Mid LAD-2 lesion, 20% stenosed.  Prox Cx to Mid Cx lesion, 35% stenosed.  1st RPLB lesion, 30% stenosed.  Ost RPDA lesion, 70% stenosed.   1. Significant one-vessel coronary artery disease with patent stent in the midright coronary artery. 70% ostial right PDA stenosis which is unchanged from most recent cardiac catheterization in 2014. A faint collateral is noted from right to left supplying what seems to be a small septal branch of the LAD. The left system has mild nonobstructive disease. 2. Normal LV systolic function. 3. Severely elevated blood pressure with moderately elevated left ventricular end-diastolic pressure.  Recommendations: The patient's dyspnea is likely due to chronic diastolic heart failure with uncontrolled hypertension. Recommend blood pressure control.     Surgical History:  Past Surgical  History:  Procedure Laterality Date  . ABDOMINAL HYSTERECTOMY     2000  . CARDIAC CATHETERIZATION    . CARDIAC CATHETERIZATION N/A 05/02/2015   Procedure: Left Heart Cath and Coronary Angiography;  Surgeon: [name], MD;  Location: [facility];  Service: Cardiovascular;  Laterality: N/A;  . colonscopy    . CORONARY ANGIOPLASTY  05/28/2014   stent placement to the mid RCA  . DILATION AND CURETTAGE OF UTERUS     several in the early 80's  . ESOPHAGOGASTRODUODENOSCOPY     2012  . EYE SURGERY     bilateral laser 2012  . EYE SURGERY     right  . GAS INSERTION  09/30/2011   Procedure: INSERTION OF GAS;  Surgeon: [name], MD;  Location: [facility];  Service: Ophthalmology;  Laterality: Right;  [id number]  . GAS/FLUID EXCHANGE  09/30/2011   Procedure: GAS/FLUID EXCHANGE;  Surgeon: [name], MD;  Location: [facility];  Service: Ophthalmology;  Laterality: Right;  . LEFT HEART CATHETERIZATION WITH CORONARY ANGIOGRAM N/A 05/28/2013   Procedure: LEFT HEART CATHETERIZATION WITH CORONARY ANGIOGRAM;  Surgeon: [name],  MD;  Location: [facility];  Service: Cardiovascular;  Laterality: N/A;  . PARS PLANA VITRECTOMY  04/22/2011   Procedure: PARS PLANA VITRECTOMY WITH 25 GAUGE;  Surgeon: [name], MD;  Location: [facility];  Service: Ophthalmology;  Laterality: Left;  membrane peel, endolaser, gas fluid exchange, silicone oil, repair of complex traction retinal detachment  . PARS PLANA VITRECTOMY  09/30/2011   Procedure: PARS PLANA VITRECTOMY WITH 25 GAUGE;  Surgeon: [name], MD;  Location: [facility];  Service: Ophthalmology;  Laterality: Right;  Endolaser; Repair of Complex Traction Retinal Detachment  . PARS PLANA VITRECTOMY  02/24/2012   Procedure: PARS PLANA VITRECTOMY WITH 25 GAUGE;  Surgeon: [name], MD;  Location: [facility];  Service: Ophthalmology;  Laterality: Left;  . PTCA    . SILICON OIL REMOVAL  7/82/9562   Procedure: SILICON OIL REMOVAL;  Surgeon: [name], MD;  Location: [facility];  Service: Ophthalmology;  Laterality: Left;  . THROMBECTOMY / ARTERIOVENOUS GRAFT REVISION    . TUBAL LIGATION     1979     Home Meds: Prior to Admission medications   Medication Sig Start Date End Date Taking? Authorizing Provider  aspirin EC 81 MG tablet Take 81 mg by mouth daily.   Yes Historical Provider, MD  calcium acetate (PHOSLO) 667 MG capsule Take [phone] mg by mouth 3 (three) times daily with meals. Take 5 capsules if you have anything with cheese, otherwise, take 4 capsules if you don't have cheese.   Yes Historical Provider, MD  carvedilol (COREG) 25 MG tablet Take 1 tablet (25 mg total) by mouth 2 (two) times daily with a meal. 02/23/16  Yes Sital Mody, MD  cloNIDine (CATAPRES) 0.1 MG tablet Take 1 tablet (0.1 mg total) by mouth 2 (two) times daily. 01/30/15  Yes Minna Merritts, MD  CRESTOR 40 MG tablet TAKE ONE TABLET BY MOUTH ONCE DAILY 08/09/15  Yes Minna Merritts, MD  furosemide (LASIX) 80 MG tablet Take 80 mg by mouth daily. Only on Saturdays, Sundays, Tuesdays and Thursdays. (non-dialysis days)   Yes Historical Provider, MD  losartan (COZAAR) 50 MG tablet Take 1 tablet (50 mg total) by mouth daily. 03/24/16  Yes Alisa Graff, FNP  multivitamin (RENA-VIT) TABS tablet Take 1 tablet by mouth daily. 03/24/16  Yes Alisa Graff, FNP  pregabalin (LYRICA) 75 MG capsule Take 1 capsule (75 mg total) by mouth daily. 04/21/16  Yes Burnard Hawthorne, FNP  albuterol (PROVENTIL HFA;VENTOLIN HFA) 108 (90 BASE) MCG/ACT inhaler Inhale 2 puffs into the lungs every 6 (six) hours as needed for wheezing or shortness of breath.    Historical Provider, MD  albuterol (PROVENTIL) (2.5 MG/3ML) 0.083% nebulizer solution Take 2.5 mg by nebulization every 4 (four) hours as needed for wheezing or shortness of breath.    Historical Provider, MD  Melatonin 1 MG CAPS Take 1 capsule by mouth daily.    Historical Provider, MD  nitroGLYCERIN (NITROSTAT) 0.4 MG SL tablet  Place 1 tablet (0.4 mg total) under the tongue every 5 (five) minutes as needed for chest pain. 09/26/15   Alisa Graff, FNP    Inpatient Medications:  . aspirin EC  81 mg Oral Daily  . calcium acetate  [phone] mg Oral TID WC  . carvedilol  25 mg Oral BID WC  . cloNIDine  0.1 mg Oral BID  . furosemide  80 mg Oral Once per day on Sun Tue Thu Sat  . heparin subcutaneous  5,000 Units Subcutaneous Q8H  . insulin aspart  0-5 Units Subcutaneous QHS  . insulin aspart  0-9 Units Subcutaneous TID WC  . losartan  50 mg Oral Daily  . multivitamin  1 tablet Oral Daily  . pregabalin  75 mg Oral Daily  . rosuvastatin  40 mg Oral QHS     Allergies:  Allergies  Allergen Reactions  . Hydrocodone Hives  . Metformin Diarrhea    Social History   Social History  . Marital status: Single    Spouse name: N/A  . Number of children: 2  . Years of education: N/A   Occupational History  . Not on file.   Social History Main Topics  . Smoking status: Former Smoker    Packs/day: 1.00    Years: 25.00    Quit date: 06/09/2010  . Smokeless tobacco: Never Used  . Alcohol use No  . Drug use: No  . Sexual activity: No   Other Topics Concern  . Not on file   Social History Narrative   On disability.    Lives alone.    2 children      Son drives her since her vision has decreased        Family History  Problem Relation Age of Onset  . Stroke Mother   . Heart attack Mother   . Heart disease Mother   . Colon cancer Father   . Colon cancer Sister   . Heart attack Brother   . Stroke Brother   . Diabetes Brother   . Breast cancer Sister   . Diabetes Brother   . Diabetes Daughter   . Anesthesia problems Neg Hx   . Hypotension Neg Hx   . Malignant hyperthermia Neg Hx   . Pseudochol deficiency Neg Hx      Review of Systems: Review of Systems  Constitutional: Positive for malaise/fatigue. Negative for chills, diaphoresis, fever and weight loss.  HENT: Negative for congestion.     Eyes: Negative for discharge and redness.  Respiratory: Negative for cough, hemoptysis, sputum production, shortness of breath and wheezing.   Cardiovascular: Positive for chest pain. Negative for palpitations, orthopnea, claudication, leg swelling and PND.  Gastrointestinal: Negative for abdominal pain, blood in stool, heartburn, melena, nausea and vomiting.  Genitourinary: Negative for hematuria.  Musculoskeletal: Negative for falls and myalgias.  Skin: Negative for rash.  Neurological: Positive for speech change and weakness. Negative for dizziness, tingling, tremors, sensory change, focal weakness, loss of consciousness and headaches.  Endo/Heme/Allergies: Does not bruise/bleed easily.  Psychiatric/Behavioral: Negative for substance abuse. The patient is not nervous/anxious.   All other systems reviewed and are negative.   Labs:  Recent Labs  05/21/16 1655 05/21/16 2014 05/21/16 2246 05/22/16 0147  TROPONINI 0.03* <0.03 <0.03 0.03*   Lab Results  Component Value Date   WBC 7.5 05/21/2016   HGB 11.5 (L) 05/21/2016   HCT 33.6 (L) 05/21/2016   MCV 99.6 05/21/2016   PLT 155 05/21/2016     Recent Labs Lab 05/21/16 1405  NA 138  K 4.1  CL 98*  CO2 31  BUN 27*  CREATININE 4.98*  CALCIUM 8.4*  GLUCOSE 232*   Lab Results  Component Value Date   CHOL 125 11/24/2014   HDL 59 11/24/2014   LDLCALC 52 11/24/2014   TRIG 70 11/24/2014   No results found for: DDIMER  Radiology/Studies:  Dg Chest 2 View  Result Date: 05/21/2016 CLINICAL DATA:  60 year old female with a history of left-sided chest  pain EXAM: CHEST  2 VIEW COMPARISON:  02/22/2016, 12/30/2015 FINDINGS: Cardiomediastinal silhouette unchanged. Calcifications of the aortic arch. Overall aeration is improved when compared to the most recent chest x-ray. No confluent airspace disease or pneumothorax. No evidence of pleural effusion. Coarsened appearance of the interstitium with low lung volumes. No displaced  fracture. IMPRESSION: Improved aeration of the lungs compared to the most recent chest x-ray of September, with coarsened interstitium potentially representing chronic changes. No evidence of lobar pneumonia or pleural effusion. Aortic atherosclerosis. Signed, Dulcy Fanny. Earleen Newport, DO [facility] Electronically Signed   By: Corrie Mckusick D.O.   On: 05/21/2016 14:50    EKG: Interpreted by me showed: NSR, 76 bpm, LVH, no acute st/t changes Telemetry: Interpreted by me showed: NSR, 70's bpm  Weights: Filed Weights   05/21/16 1400 05/21/16 2002  Weight: 172 lb (78 kg) 168 lb 1.6 oz (76.2 kg)     Physical Exam: Blood pressure (!) 188/58, pulse 73, temperature 97.8 F (36.6 C), temperature source Oral, resp. rate 16, height 5' 7.5" (1.715 m), weight 168 lb 1.6 oz (76.2 kg), SpO2 100 %. Body mass index is 25.94 kg/m. General: Well developed, well nourished, in no acute distress. Head: Normocephalic, atraumatic, sclera non-icteric, no xanthomas, nares are without discharge.  Neck: Negative for carotid bruits. JVD not elevated. Lungs: Clear bilaterally to auscultation without wheezes, rales, or rhonchi. Breathing is unlabored. Heart: RRR with S1 S2. II/VI systolic murmur, no rubs, or gallops appreciated. Anterior chest wall is very sensitive to palpation which fully reproduces her pain.  Abdomen: Soft, non-tender, non-distended with normoactive bowel sounds. No hepatomegaly. No rebound/guarding. No obvious abdominal masses. Msk:  Strength and tone appear normal for age. Extremities: No clubbing or cyanosis. No edema. Distal pedal pulses are 2+ and equal bilaterally. Neuro: Alert and oriented X 3. No facial asymmetry. No focal deficit. Moves all extremities spontaneously. Psych:  Responds to questions appropriately with a normal affect.    Assessment and Plan:  Principal Problem:   Accelerated hypertension Active Problems:   ESRD on  hemodialysis (HCC)   Diabetes mellitus, type II ([place])   Coronary artery disease   Diastolic heart failure (HCC)   OSA on CPAP   Atypical chest pain   COPD (chronic obstructive pulmonary disease) (HCC)   Anemia in chronic kidney disease (CKD)    1. Atypical chest pain: -Currently without ches tpain -Pain only with palpation to the central chest that fully reproduces her pain -negative troponin -Recent stress test 03/2016 that was improved from prior -No further inpatient cardiac work up needed -Defer treatment of atypical chest pain to IM  2. Aphasia: -patient reports her prior concern was an episode of aphasia during HD on 12/13 -No CT head -Defer work up to IM  3. Accelerated HTN: -Add Imdur -She reports significant drops in BP with HD -Continue remaining BP meds  4. Chronic diastolic CHF: -She does not appear volume overloaded at this time -Volume is managed through HD -Continue current medications  5. Remaining per IM   Signed, Christell Faith, PA-C [facility] Pager: [phone] 05/22/2016, 8:15 AM

## 2016-05-22 NOTE — Progress Notes (Signed)
[facility] [place] NAME: Theresa [name]    MR#:  [id number]  DATE OF BIRTH:  1955-08-03  SUBJECTIVE:  CHIEF COMPLAINT:  Patient is reporting midsternal chest pain, hurts when she moves She is reporting that she has passed out yesterday during dialysisand also she was nauseous and having chest pain at the time, passing out episode was not reported by yesterday at the time of admission  REVIEW OF SYSTEMS:  CONSTITUTIONAL: No fever, fatigue or weakness.  EYES: No blurred or double vision.  EARS, NOSE, AND THROAT: No tinnitus or ear pain.  RESPIRATORY: No cough, shortness of breath, wheezing or hemoptysis.  CARDIOVASCULAR: reporting midsternal chest pain, denies orthopnea, edema.  GASTROINTESTINAL: No nausea, vomiting, diarrhea or abdominal pain.  GENITOURINARY: No dysuria, hematuria.  ENDOCRINE: No polyuria, nocturia,  HEMATOLOGY: No anemia, easy bruising or bleeding SKIN: No rash or lesion. MUSCULOSKELETAL: No joint pain or arthritis.   NEUROLOGIC: No tingling, numbness, weakness.  PSYCHIATRY: No anxiety or depression.   DRUG ALLERGIES:   Allergies  Allergen Reactions  . Hydrocodone Hives  . Metformin Diarrhea    VITALS:  Blood pressure (!) 187/49, pulse 78, temperature 98.1 F (36.7 C), temperature source Oral, resp. rate 16, height 5' 7.5" (1.715 m), weight 76.2 kg (168 lb 1.6 oz), SpO2 97 %.  PHYSICAL EXAMINATION:  GENERAL:  60 y.o.-year-old patient lying in the bed with no acute distress.  EYES: Pupils equal, round, reactive to light and accommodation. No scleral icterus. Extraocular muscles intact.  HEENT: Head atraumatic, normocephalic. Oropharynx and nasopharynx clear.  NECK:  Supple, no jugular venous distention. No thyroid enlargement, no tenderness.  LUNGS: Normal breath sounds bilaterally, no wheezing, rales,rhonchi or crepitation. No use of accessory muscles of respiration.  CARDIOVASCULAR: S1, S2 normal. No  murmurs, rubs, or gallops. Reproducible midsternal chest wall tenderness ABDOMEN: Soft, nontender, nondistended. Bowel sounds present. No organomegaly or mass.  EXTREMITIES: No pedal edema, cyanosis, or clubbing.  NEUROLOGIC: Cranial nerves II through XII are intact. Muscle strength 5/5 in all extremities. Sensation intact. Gait not checked.  PSYCHIATRIC: The patient is alert and oriented x 3.  SKIN: No obvious rash, lesion, or ulcer.    LABORATORY PANEL:   CBC  Recent Labs Lab 05/21/16 1405  WBC 7.5  HGB 11.5*  HCT 33.6*  PLT 155   ------------------------------------------------------------------------------------------------------------------  Chemistries   Recent Labs Lab 05/21/16 1405  NA 138  K 4.1  CL 98*  CO2 31  GLUCOSE 232*  BUN 27*  CREATININE 4.98*  CALCIUM 8.4*   ------------------------------------------------------------------------------------------------------------------  Cardiac Enzymes  Recent Labs Lab 05/22/16 0147  TROPONINI 0.03*   ------------------------------------------------------------------------------------------------------------------  RADIOLOGY:  Dg Chest 2 View  Result Date: 05/21/2016 CLINICAL DATA:  60 year old female with a history of left-sided chest pain EXAM: CHEST  2 VIEW COMPARISON:  02/22/2016, 12/30/2015 FINDINGS: Cardiomediastinal silhouette unchanged. Calcifications of the aortic arch. Overall aeration is improved when compared to the most recent chest x-ray. No confluent airspace disease or pneumothorax. No evidence of pleural effusion. Coarsened appearance of the interstitium with low lung volumes. No displaced fracture. IMPRESSION: Improved aeration of the lungs compared to the most recent chest x-ray of September, with coarsened interstitium potentially representing chronic changes. No evidence of lobar pneumonia or pleural effusion. Aortic atherosclerosis. Signed, Dulcy Fanny. Earleen Newport, DO [facility] Electronically Signed   By: Corrie Mckusick D.O.   On: 05/21/2016 14:50    EKG:   Orders placed  or performed during the hospital encounter of 05/21/16  . ED EKG within 10 minutes  . ED EKG within 10 minutes    ASSESSMENT AND PLAN:   patient came into the emergency department as she had episode of chest pain during dialysis and also patient is reporting that she passed out at the time. Patient has reported aphasia during HD YESTERDAY, to the cardiologist  #syncope /TIA Telemetry monitoring We will get stroke workup with CT head and carotid Dopplers Recent echocardiogram in September 2017 has revealed EF 55-60% Check TSH and hemoglobin A1c Check fasting lipid panel. Currently she is on Crestor Neurology consult is placed PT, OT and speech therapy evaluation after bedside swallow evaluation by the RN Patient is on aspirin Check orthostatics  #Hypertensive urgency Resume home medications clonidine, Coreg and Lasix and titrate as needed Imdur 60 mg is added to the regimen. Will monitor and titrate as needed   # atypical chest pain with history of coronary artery disease status post stent placement Seems to be musculoskeletal as it is reproducible Telemetry Cycle cardiac biomarkers Recent echocardiogram has revealed 55-60% ejection fraction in September 2017 Continue her home medication aspirin, Coreg, Crestor Cardiology consult is placed- [facility]  #End stage renal disease on hemodialysis Monday, Wednesday, Friday Patient had incomplete hemodialysis today which was discontinued in the middle because of the chest pain Not fluid overloaded at this time Nephrology consult placed  #Diabetes mellitus Diabetic diet and sliding scale insulin   #Chronic COPD no exacerbation Continue home medications including inhalers    All the records are reviewed and case discussed with Care Management/Social Workerr. Management  plans discussed with the patient, family and they are in agreement.  CODE STATUS: fc   TOTAL TIME TAKING CARE OF THIS PATIENT: 37 minutes.   POSSIBLE D/C IN 1-2  DAYS, DEPENDING ON CLINICAL CONDITION.  Note: This dictation was prepared with Dragon dictation along with smaller phrase technology. Any transcriptional errors that result from this process are unintentional.   Nicholes Mango M.D on 05/22/2016 at 11:02 AM  Between 7am to 6pm - Pager - [phone] After 6pm go to [email or web address] - password [email or web address] [place] [facility]  Office  [phone]  CC: Primary care physician; Mable Paris, FNP

## 2016-05-22 NOTE — Care Management Obs Status (Signed)
MEDICARE OBSERVATION STATUS NOTIFICATION   Patient Details  Name: DEMETRICA ZIPP MRN: [id number] Date of Birth: February 25, 1956   Medicare Observation Status Notification Given:  Yes    Katrina Stack, RN 05/22/2016, 5:23 PM

## 2016-05-22 NOTE — Progress Notes (Signed)
Patients BP 190/54 lying, 181/58 sitting and 179/54 standing. MD notified and cardiology notified. Per cardiology give patient 60mg  Imdur and notify physician if patient's systolic BP over 715 persistently.

## 2016-05-22 NOTE — Consult Note (Signed)
Reason for Consult:Syncope Referring Physician: Gouru  CC: Syncope  HPI: Theresa [name] is an 60 y.o. female with a history of ESRD who was at dialysis on yesterday and reports feeling very hot and starting to experience chest pain.  She laid back and remembers nothing else until coming to in the ED.  She reports that at that time she was unable to speak.  ED records note that the patient was able to nod head in response to questioning but would not respond verbally.  After some time the patient returned to baseline.  Patient was noted to be markedly hypertensive.   Past Medical History:  Diagnosis Date  . Anemia of chronic disease   . Carotid arterial disease ([place])    a. 02/2013 U/S: 40-59% bilat ICA stenosis - *f/u 02/2014*  . Chronic constipation   . Chronic diastolic CHF (congestive heart failure) ([place])    a. 10/2013 Echo [facility]): EF 55-60%, mod conc LVH, mod MR, mildly dil LA, mild Ao sclerosis w/o stenosis.  . Colon polyps   . COPD (chronic obstructive pulmonary disease) ([place])   . Coronary artery disease    a. 05/2013 NSTEMI/PCI: LM 20d, LAD min irregs, LCX small, nl, OM1 nl, RCA dom 4m (2.5x16 Promus DES), PDA1 80p.  . Diabetes mellitus   . Diabetic neuropathy ([place])   . Diabetic retinopathy ([place]) 05/28/2013   Hx bilat retinal detachment, proliferative diab retinopathy and bilat vitreous hemorrhage   . Emphysema   . ESRD on hemodialysis ([place])    a. [facility] in [place], [place], on a TTS schedule.  She started dialysis in Feb 2014.  Etiology of renal failure not known, likely diabetes.  Has a left upper arm AV graft.  . History of bronchitis    Mar 2012  . History of pneumonia    June 2012  . History of tobacco abuse    a. Quit 2012.  Marland Kitchen Hyperlipidemia   . Hypertension   . Moderate mitral insufficiency    a. 10/2013 Echo: EF 55-60%, mod MR.  . Peripheral vascular disease ([place])   . Sickle cell trait [facility])     Past Surgical History:  Procedure Laterality Date  . ABDOMINAL  HYSTERECTOMY     2000  . CARDIAC CATHETERIZATION    . CARDIAC CATHETERIZATION N/A 05/02/2015   Procedure: Left Heart Cath and Coronary Angiography;  Surgeon: [name], MD;  Location: [place] CV [facility];  Service: Cardiovascular;  Laterality: N/A;  . colonscopy    . CORONARY ANGIOPLASTY  05/28/2014   stent placement to the mid RCA  . DILATION AND CURETTAGE OF UTERUS     several in the early 80's  . ESOPHAGOGASTRODUODENOSCOPY     2012  . EYE SURGERY     bilateral laser 2012  . EYE SURGERY     right  . GAS INSERTION  09/30/2011   Procedure: INSERTION OF GAS;  Surgeon: [name], MD;  Location: [place];  Service: Ophthalmology;  Laterality: Right;  [id number]  . GAS/FLUID EXCHANGE  09/30/2011   Procedure: GAS/FLUID EXCHANGE;  Surgeon: [name], MD;  Location: [place];  Service: Ophthalmology;  Laterality: Right;  . LEFT HEART CATHETERIZATION WITH CORONARY ANGIOGRAM N/A 05/28/2013   Procedure: LEFT HEART CATHETERIZATION WITH CORONARY ANGIOGRAM;  Surgeon: [name], MD;  Location: [facility] C[facility];  Service: Cardiovascular;  Laterality: N/A;  . PARS PLANA VITRECTOMY  04/22/2011   Procedure: PARS PLANA VITRECTOMY WITH 25 GAUGE;  Surgeon: [name],  MD;  Location: [place];  Service: Ophthalmology;  Laterality: Left;  membrane peel, endolaser, gas fluid exchange, silicone oil, repair of complex traction retinal detachment  . PARS PLANA VITRECTOMY  09/30/2011   Procedure: PARS PLANA VITRECTOMY WITH 25 GAUGE;  Surgeon: [name], MD;  Location: [place];  Service: Ophthalmology;  Laterality: Right;  Endolaser; Repair of Complex Traction Retinal Detachment  . PARS PLANA VITRECTOMY  02/24/2012   Procedure: PARS PLANA VITRECTOMY WITH 25 GAUGE;  Surgeon: [name], MD;  Location: [place];  Service: Ophthalmology;  Laterality: Left;  . PTCA    . SILICON OIL REMOVAL  5/63/1497   Procedure: SILICON OIL REMOVAL;  Surgeon: [name], MD;  Location: [place];  Service:  Ophthalmology;  Laterality: Left;  . THROMBECTOMY / ARTERIOVENOUS GRAFT REVISION    . TUBAL LIGATION     1979    Family History  Problem Relation Age of Onset  . Stroke Mother   . Heart attack Mother   . Heart disease Mother   . Colon cancer Father   . Colon cancer Sister   . Heart attack Brother   . Stroke Brother   . Diabetes Brother   . Breast cancer Sister   . Diabetes Brother   . Diabetes Daughter   . Anesthesia problems Neg Hx   . Hypotension Neg Hx   . Malignant hyperthermia Neg Hx   . Pseudochol deficiency Neg Hx     Social History:  reports that she quit smoking about 5 years ago. She has a 25.00 pack-year smoking history. She has never used smokeless tobacco. She reports that she does not drink alcohol or use drugs.  Allergies  Allergen Reactions  . Hydrocodone Hives  . Metformin Diarrhea    Medications:  I have reviewed the patient's current medications. Prior to Admission:  Facility-Administered Medications Prior to Admission  Medication Dose Route Frequency Provider Last Rate Last Dose  . betamethasone acetate-betamethasone sodium phosphate (CELESTONE) injection 3 mg  3 mg Intramuscular Once Edrick Kins, DPM       Prescriptions Prior to Admission  Medication Sig Dispense Refill Last Dose  . aspirin EC 81 MG tablet Take 81 mg by mouth daily.   05/20/2016 at Unknown time  . calcium acetate (PHOSLO) 667 MG capsule Take [phone] mg by mouth 3 (three) times daily with meals. Take 5 capsules if you have anything with cheese, otherwise, take 4 capsules if you don't have cheese.   05/21/2016 at 0800  . carvedilol (COREG) 25 MG tablet Take 1 tablet (25 mg total) by mouth 2 (two) times daily with a meal. 60 tablet 0 05/21/2016 at 0800  . cloNIDine (CATAPRES) 0.1 MG tablet Take 1 tablet (0.1 mg total) by mouth 2 (two) times daily. 60 tablet 11 05/21/2016 at 0800  . CRESTOR 40 MG tablet TAKE ONE TABLET BY MOUTH ONCE DAILY 90 tablet 3 05/20/2016 at Unknown time  .  furosemide (LASIX) 80 MG tablet Take 80 mg by mouth daily. Only on Saturdays, Sundays, Tuesdays and Thursdays. (non-dialysis days)   Taking  . losartan (COZAAR) 50 MG tablet Take 1 tablet (50 mg total) by mouth daily. 90 tablet 3 05/21/2016 at 0800  . multivitamin (RENA-VIT) TABS tablet Take 1 tablet by mouth daily. 30 tablet 5 05/21/2016 at 0800  . pregabalin (LYRICA) 75 MG capsule Take 1 capsule (75 mg total) by mouth daily. 90 capsule 1 05/20/2016 at Unknown time  . albuterol (PROVENTIL HFA;VENTOLIN HFA) 108 (90  BASE) MCG/ACT inhaler Inhale 2 puffs into the lungs every 6 (six) hours as needed for wheezing or shortness of breath.   prn at prn  . albuterol (PROVENTIL) (2.5 MG/3ML) 0.083% nebulizer solution Take 2.5 mg by nebulization every 4 (four) hours as needed for wheezing or shortness of breath.   prn at prn  . Melatonin 1 MG CAPS Take 1 capsule by mouth daily.   prn at prn  . nitroGLYCERIN (NITROSTAT) 0.4 MG SL tablet Place 1 tablet (0.4 mg total) under the tongue every 5 (five) minutes as needed for chest pain. 25 tablet 3 prn at prn   Scheduled: . aspirin EC  81 mg Oral Daily  . calcium acetate  [phone] mg Oral TID WC  . carvedilol  25 mg Oral BID WC  . cloNIDine  0.1 mg Oral BID  . furosemide  80 mg Oral Once per day on Sun Tue Thu Sat  . heparin subcutaneous  5,000 Units Subcutaneous Q8H  . insulin aspart  0-5 Units Subcutaneous QHS  . insulin aspart  0-9 Units Subcutaneous TID WC  . isosorbide mononitrate  60 mg Oral Daily  . losartan  50 mg Oral Daily  . multivitamin  1 tablet Oral Daily  . pregabalin  75 mg Oral Daily  . rosuvastatin  40 mg Oral QHS    ROS: History obtained from the patient  General ROS: negative for - chills, fatigue, fever, night sweats, weight gain or weight loss Psychological ROS: negative for - behavioral disorder, hallucinations, memory difficulties, mood swings or suicidal ideation Ophthalmic ROS: negative for - blurry vision, double vision, eye  pain or loss of vision ENT ROS: negative for - epistaxis, nasal discharge, oral lesions, sore throat, tinnitus or vertigo Allergy and Immunology ROS: negative for - hives or itchy/watery eyes Hematological and Lymphatic ROS: negative for - bleeding problems, bruising or swollen lymph nodes Endocrine ROS: negative for - galactorrhea, hair pattern changes, polydipsia/polyuria or temperature intolerance Respiratory ROS: negative for - cough, hemoptysis, shortness of breath or wheezing Cardiovascular ROS: as noted in HPI Gastrointestinal ROS: negative for - abdominal pain, diarrhea, hematemesis, nausea/vomiting or stool incontinence Genito-Urinary ROS: negative for - dysuria, hematuria, incontinence or urinary frequency/urgency Musculoskeletal ROS: negative for - joint swelling or muscular weakness Neurological ROS: as noted in HPI Dermatological ROS: negative for rash and skin lesion changes  Physical Examination: Blood pressure (!) 163/52, pulse 72, temperature 98.1 F (36.7 C), temperature source Oral, resp. rate 16, height 5' 7.5" (1.715 m), weight 76.2 kg (168 lb 1.6 oz), SpO2 93 %.  HEENT-  Normocephalic, no lesions, without obvious abnormality.  Normal external eye and conjunctiva.  Normal TM's bilaterally.  Normal auditory canals and external ears. Normal external nose, mucus membranes and septum.  Normal pharynx. Cardiovascular- S1, S2 normal, pulses palpable throughout   Lungs- chest clear, no wheezing, rales, normal symmetric air entry Abdomen- soft, non-tender; bowel sounds normal; no masses,  no organomegaly Extremities- no edema Lymph-no adenopathy palpable Musculoskeletal-no joint tenderness, deformity or swelling Skin-warm and dry, no hyperpigmentation, vitiligo, or suspicious lesions  Neurological Examination Mental Status: Alert, oriented, thought content appropriate.  Speech fluent without evidence of aphasia.  Able to follow 3 step commands without difficulty. Cranial  Nerves: II: Discs flat bilaterally; Visual fields grossly normal, pupils equal, round, reactive to light and accommodation III,IV, VI: ptosis not present, extra-ocular motions intact bilaterally V,VII: smile symmetric, facial light touch sensation normal bilaterally VIII: hearing normal bilaterally IX,X: gag reflex present XI: bilateral shoulder  shrug XII: midline tongue extension Motor: Right : Upper extremity   5/5    Left:     Upper extremity   5/5  Lower extremity   5/5     Lower extremity   5/5 Tone and bulk:normal tone throughout; no atrophy noted Sensory: Pinprick and light touch intact throughout, bilaterally Deep Tendon Reflexes: 2+ in the upper extremities and absent in the lower extremities Plantars: Right: downgoing   Left: downgoing Cerebellar: Normal finger-to-nose and normal heel-to-shin testing bilaterally Gait: not tested due to safety concerns    Laboratory Studies:   Basic Metabolic Panel:  Recent Labs Lab 05/21/16 1405  NA 138  K 4.1  CL 98*  CO2 31  GLUCOSE 232*  BUN 27*  CREATININE 4.98*  CALCIUM 8.4*    Liver Function Tests: No results for input(s): AST, ALT, ALKPHOS, BILITOT, PROT, ALBUMIN in the last 168 hours. No results for input(s): LIPASE, AMYLASE in the last 168 hours. No results for input(s): AMMONIA in the last 168 hours.  CBC:  Recent Labs Lab 05/21/16 1405  WBC 7.5  HGB 11.5*  HCT 33.6*  MCV 99.6  PLT 155    Cardiac Enzymes:  Recent Labs Lab 05/21/16 1405 05/21/16 1655 05/21/16 2014 05/21/16 2246 05/22/16 0147  TROPONINI <0.03 0.03* <0.03 <0.03 0.03*    BNP: Invalid input(s): POCBNP  CBG:  Recent Labs Lab 05/21/16 1843 05/21/16 2115 05/22/16 0734 05/22/16 1242  GLUCAP 119* 222* 164* 169*    Microbiology: Results for orders placed or performed during the hospital encounter of 05/21/16  MRSA PCR Screening     Status: None   Collection Time: 05/21/16  8:23 PM  Result Value Ref Range Status   MRSA by  PCR NEGATIVE NEGATIVE Final    Comment:        The GeneXpert MRSA Assay (FDA approved for NASAL specimens only), is one component of a comprehensive MRSA colonization surveillance program. It is not intended to diagnose MRSA infection nor to guide or monitor treatment for MRSA infections.     Coagulation Studies: No results for input(s): LABPROT, INR in the last 72 hours.  Urinalysis: No results for input(s): COLORURINE, LABSPEC, PHURINE, GLUCOSEU, HGBUR, BILIRUBINUR, KETONESUR, PROTEINUR, UROBILINOGEN, NITRITE, LEUKOCYTESUR in the last 168 hours.  Invalid input(s): APPERANCEUR  Lipid Panel:     Component Value Date/Time   CHOL 161 05/22/2016 0147   TRIG 65 05/22/2016 0147   HDL 59 05/22/2016 0147   CHOLHDL 2.7 05/22/2016 0147   VLDL 13 05/22/2016 0147   LDLCALC 89 05/22/2016 0147    HgbA1C:  Lab Results  Component Value Date   HGBA1C 5.4 07/27/2015    Urine Drug Screen:  No results found for: LABOPIA, COCAINSCRNUR, LABBENZ, AMPHETMU, THCU, LABBARB  Alcohol Level: No results for input(s): ETH in the last 168 hours.   Imaging: Dg Chest 2 View  Result Date: 05/21/2016 CLINICAL DATA:  60 year old female with a history of left-sided chest pain EXAM: CHEST  2 VIEW COMPARISON:  02/22/2016, 12/30/2015 FINDINGS: Cardiomediastinal silhouette unchanged. Calcifications of the aortic arch. Overall aeration is improved when compared to the most recent chest x-ray. No confluent airspace disease or pneumothorax. No evidence of pleural effusion. Coarsened appearance of the interstitium with low lung volumes. No displaced fracture. IMPRESSION: Improved aeration of the lungs compared to the most recent chest x-ray of September, with coarsened interstitium potentially representing chronic changes. No evidence of lobar pneumonia or pleural effusion. Aortic atherosclerosis. Signed, Dulcy Fanny. Earleen Newport, DO Vascular and Interventional Radiology  [facility] Radiology Electronically  Signed   By: Corrie Mckusick D.O.   On: 05/21/2016 14:50   Ct Head Wo Contrast  Result Date: 05/22/2016 CLINICAL DATA:  Syncope yesterday with nausea during dialysis. EXAM: CT HEAD WITHOUT CONTRAST TECHNIQUE: Contiguous axial images were obtained from the base of the skull through the vertex without intravenous contrast. COMPARISON:  Multiple exams, including 11/23/2014 FINDINGS: Brain: The brainstem, cerebellum, cerebral peduncles, thalami, basal ganglia, basilar cisterns, and ventricular system appear within normal limits. No intracranial hemorrhage, mass lesion, or acute CVA. Chronic an probably physiologic calcification of the globus pallidus nuclei observed. Periventricular white matter and corona radiata hypodensities favor chronic ischemic microvascular white matter disease. Vascular: There is atherosclerotic calcification of the cavernous carotid arteries bilaterally. Skull: Unremarkable Sinuses/Orbits: Unremarkable Other: No supplemental non-categorized findings. IMPRESSION: 1. No acute intracranial findings. 2. Periventricular white matter and corona radiata hypodensities favor chronic ischemic microvascular white matter disease. Electronically Signed   By: Van Clines M.D.   On: 05/22/2016 11:40   US Carotid Bilateral  Result Date: 05/22/2016 CLINICAL DATA:  Syncope EXAM: BILATERAL CAROTID DUPLEX ULTRASOUND TECHNIQUE: Pearline Cables scale imaging, color Doppler and duplex ultrasound were performed of bilateral carotid and vertebral arteries in the neck. COMPARISON:  None. FINDINGS: Criteria: Quantification of carotid stenosis is based on velocity parameters that correlate the residual internal carotid diameter with NASCET-based stenosis levels, using the diameter of the distal internal carotid lumen as the denominator for stenosis measurement. The following velocity measurements were obtained: RIGHT ICA:  134 cm/sec CCA:  89 cm/sec SYSTOLIC ICA/CCA RATIO:  1.6 DIASTOLIC ICA/CCA RATIO:  2.2 ECA:  195  cm/sec LEFT ICA:  155 cm/sec CCA:  89 cm/sec SYSTOLIC ICA/CCA RATIO:  0.7 DIASTOLIC ICA/CCA RATIO:  2.0 ECA:  231 cm/sec RIGHT CAROTID ARTERY: Moderate focal calcified plaque in the bulb. This causes luminal narrowing on color Doppler imaging. Doppler analysis demonstrates a low resistance pattern. RIGHT VERTEBRAL ARTERY:  Antegrade. LEFT CAROTID ARTERY: Moderate focal calcified plaque in the bulb. Low resistance internal carotid Doppler pattern. LEFT VERTEBRAL ARTERY:  Antegrade. IMPRESSION: 50-69% stenosis in the right and left internal carotid arteries. Vertebral arteries are antegrade in flow. Electronically Signed   By: Marybelle Killings M.D.   On: 05/22/2016 12:41     Assessment/Plan: 60 year old female presenting after a syncopal episode.  Symptom likely related to elevated BP but will rule other possibilities.  Head CT reviewed and shows no acute changes.  Neurological examination is non-focal.  Carotid dopplers do not appear to be significantly changed from previous testing.  A1c pending.  LDL 89.  Recommendations: 1. MRI of the brain without contrast 2. EEG  Alexis [place], MD Neurology [phone] 05/22/2016, 3:54 PM

## 2016-05-23 ENCOUNTER — Observation Stay: Payer: Medicare Other

## 2016-05-23 DIAGNOSIS — R112 Nausea with vomiting, unspecified: Secondary | ICD-10-CM | POA: Diagnosis not present

## 2016-05-23 DIAGNOSIS — N2581 Secondary hyperparathyroidism of renal origin: Secondary | ICD-10-CM | POA: Diagnosis not present

## 2016-05-23 DIAGNOSIS — R55 Syncope and collapse: Secondary | ICD-10-CM | POA: Diagnosis not present

## 2016-05-23 DIAGNOSIS — I10 Essential (primary) hypertension: Secondary | ICD-10-CM | POA: Diagnosis not present

## 2016-05-23 DIAGNOSIS — R0602 Shortness of breath: Secondary | ICD-10-CM | POA: Diagnosis not present

## 2016-05-23 DIAGNOSIS — N186 End stage renal disease: Secondary | ICD-10-CM | POA: Diagnosis not present

## 2016-05-23 DIAGNOSIS — R079 Chest pain, unspecified: Secondary | ICD-10-CM | POA: Diagnosis not present

## 2016-05-23 DIAGNOSIS — R0789 Other chest pain: Secondary | ICD-10-CM | POA: Diagnosis not present

## 2016-05-23 LAB — GLUCOSE, CAPILLARY
GLUCOSE-CAPILLARY: 122 mg/dL — AB (ref 65–99)
GLUCOSE-CAPILLARY: 98 mg/dL (ref 65–99)
GLUCOSE-CAPILLARY: 181 mg/dL — AB (ref 65–99)
Glucose-Capillary: 173 mg/dL — ABNORMAL HIGH (ref 65–99)

## 2016-05-23 LAB — HEMOGLOBIN A1C
Hgb A1c MFr Bld: 6 % — ABNORMAL HIGH (ref 4.8–5.6)
Mean Plasma Glucose: 126 mg/dL

## 2016-05-23 MED ORDER — PROMETHAZINE HCL 25 MG/ML IJ SOLN: 12.5000 mg | Freq: Four times a day (QID) | INTRAMUSCULAR | Status: DC | PRN

## 2016-05-23 MED ORDER — ATORVASTATIN CALCIUM 20 MG PO TABS
40.0000 mg | ORAL_TABLET | Freq: Every day | ORAL | Status: DC
  Administered 2016-05-23: 40 mg via ORAL
  Filled 2016-05-23 (×2): qty 2

## 2016-05-23 MED ORDER — CLONIDINE HCL 0.1 MG PO TABS
0.2000 mg | ORAL_TABLET | Freq: Two times a day (BID) | ORAL | Status: DC
  Administered 2016-05-23 – 2016-05-24 (×2): 0.2 mg via ORAL
  Filled 2016-05-23 (×2): qty 2

## 2016-05-23 MED ORDER — AMLODIPINE BESYLATE 5 MG PO TABS
5.0000 mg | ORAL_TABLET | Freq: Every day | ORAL | Status: DC
  Administered 2016-05-23 – 2016-05-24 (×2): 5 mg via ORAL
  Filled 2016-05-23 (×2): qty 1

## 2016-05-23 NOTE — Progress Notes (Signed)
  End of hd 

## 2016-05-23 NOTE — Progress Notes (Signed)
Patient Name: Theresa [name] Date of Encounter: 05/23/2016  Primary Cardiologist: Dr. Rolly Pancake Problem List     Principal Problem:   Accelerated hypertension Active Problems:   COPD (chronic obstructive pulmonary disease) ([place])   Diabetes mellitus, type II ([place])   ESRD on hemodialysis (HCC)   Anemia in chronic kidney disease (CKD)   Coronary artery disease   Diastolic heart failure (HCC)   OSA on CPAP   Atypical chest pain     Subjective   The patient returned from MRI and was not feeling well at all. She was very nauseous with significant shortness of breath. Blood pressure was up to 220. She is orthopneic. She is going for dialysis.  Inpatient Medications    Scheduled Meds: . aspirin EC  81 mg Oral Daily  . calcium acetate  [phone] mg Oral TID WC  . carvedilol  25 mg Oral BID WC  . cloNIDine  0.1 mg Oral BID  . furosemide  80 mg Oral Once per day on Sun Tue Thu Sat  . heparin subcutaneous  5,000 Units Subcutaneous Q8H  . insulin aspart  0-5 Units Subcutaneous QHS  . insulin aspart  0-9 Units Subcutaneous TID WC  . isosorbide mononitrate  60 mg Oral Daily  . losartan  50 mg Oral Daily  . multivitamin  1 tablet Oral Daily  . pregabalin  75 mg Oral Daily  . rosuvastatin  40 mg Oral QHS   Continuous Infusions:  PRN Meds: acetaminophen, albuterol, ibuprofen, Melatonin, nitroGLYCERIN, ondansetron (ZOFRAN) IV, oxyCODONE-acetaminophen, promethazine   Vital Signs    Vitals:   05/23/16 0737 05/23/16 1027 05/23/16 1145 05/23/16 1155  BP: (!) 202/59 (!) 222/95 (!) 214/67 (!) 215/63  Pulse: 72 78 73 74  Resp: 17  (!) 26 19  Temp: 98.6 F (37 C)  98 F (36.7 C)   TempSrc: Oral  Axillary   SpO2: 94% 95% 100% 100%  Weight:   171 lb 8.3 oz (77.8 kg)   Height:        Intake/Output Summary (Last 24 hours) at 05/23/16 1219 Last data filed at 05/22/16 1924  Gross per 24 hour  Intake              480 ml  Output                0 ml  Net              480  ml   Filed Weights   05/21/16 1400 05/21/16 2002 05/23/16 1145  Weight: 172 lb (78 kg) 168 lb 1.6 oz (76.2 kg) 171 lb 8.3 oz (77.8 kg)    Physical Exam    GEN: Well nourished, well developed,  In mild respiratory distress HEENT: Grossly normal.  Neck: Supple, no JVD, carotid bruits, or masses. Cardiac: RRR, norubs, or gallops. No clubbing, cyanosis, edema.  There is a 2/6 holosystolic murmur at the apex on the left sternal border Respiratory:  Respirations regular and unlabored, clear to auscultation bilaterally. GI: Soft, nontender, nondistended, BS + x 4. MS: no deformity or atrophy. Skin: warm and dry, no rash. Neuro:  Strength and sensation are intact. Psych: AAOx3.  Normal affect.  Labs    CBC  Recent Labs  05/21/16 1405  WBC 7.5  HGB 11.5*  HCT 33.6*  MCV 99.6  PLT 536   Basic Metabolic Panel  Recent Labs  05/21/16 1405  NA 138  K 4.1  CL 98*  CO2 31  GLUCOSE 232*  BUN 27*  CREATININE 4.98*  CALCIUM 8.4*   Liver Function Tests No results for input(s): AST, ALT, ALKPHOS, BILITOT, PROT, ALBUMIN in the last 72 hours. No results for input(s): LIPASE, AMYLASE in the last 72 hours. Cardiac Enzymes  Recent Labs  05/21/16 2014 05/21/16 2246 05/22/16 0147  TROPONINI <0.03 <0.03 0.03*   BNP Invalid input(s): POCBNP D-Dimer No results for input(s): DDIMER in the last 72 hours. Hemoglobin A1C  Recent Labs  05/22/16 0147  HGBA1C 6.0*   Fasting Lipid Panel  Recent Labs  05/22/16 0147  CHOL 161  HDL 59  LDLCALC 89  TRIG 65  CHOLHDL 2.7   Thyroid Function Tests  Recent Labs  05/22/16 0147  TSH 1.685    Telemetry    Normal sinus rhythm- Personally Reviewed  ECG    Not done today- Personally Reviewed  Radiology    Dg Chest 2 View  Result Date: 05/21/2016 CLINICAL DATA:  60 year old female with a history of left-sided chest pain EXAM: CHEST  2 VIEW COMPARISON:  02/22/2016, 12/30/2015 FINDINGS: Cardiomediastinal silhouette  unchanged. Calcifications of the aortic arch. Overall aeration is improved when compared to the most recent chest x-ray. No confluent airspace disease or pneumothorax. No evidence of pleural effusion. Coarsened appearance of the interstitium with low lung volumes. No displaced fracture. IMPRESSION: Improved aeration of the lungs compared to the most recent chest x-ray of September, with coarsened interstitium potentially representing chronic changes. No evidence of lobar pneumonia or pleural effusion. Aortic atherosclerosis. Signed, Dulcy Fanny. Earleen Newport, DO [facility] Electronically Signed   By: Corrie Mckusick D.O.   On: 05/21/2016 14:50   Ct Head Wo Contrast  Result Date: 05/22/2016 CLINICAL DATA:  Syncope yesterday with nausea during dialysis. EXAM: CT HEAD WITHOUT CONTRAST TECHNIQUE: Contiguous axial images were obtained from the base of the skull through the vertex without intravenous contrast. COMPARISON:  Multiple exams, including 11/23/2014 FINDINGS: Brain: The brainstem, cerebellum, cerebral peduncles, thalami, basal ganglia, basilar cisterns, and ventricular system appear within normal limits. No intracranial hemorrhage, mass lesion, or acute CVA. Chronic an probably physiologic calcification of the globus pallidus nuclei observed. Periventricular white matter and corona radiata hypodensities favor chronic ischemic microvascular white matter disease. Vascular: There is atherosclerotic calcification of the cavernous carotid arteries bilaterally. Skull: Unremarkable Sinuses/Orbits: Unremarkable Other: No supplemental non-categorized findings. IMPRESSION: 1. No acute intracranial findings. 2. Periventricular white matter and corona radiata hypodensities favor chronic ischemic microvascular white matter disease. Electronically Signed   By: Van Clines M.D.   On: 05/22/2016 11:40   Mr Brain Wo Contrast  Result Date: 05/23/2016 CLINICAL DATA:   Syncope post dialysis EXAM: MRI HEAD WITHOUT CONTRAST TECHNIQUE: Multiplanar, multiecho pulse sequences of the brain and surrounding structures were obtained without intravenous contrast. COMPARISON:  CT 05/22/2016.  MRI 11/24/2014 FINDINGS: Brain: Negative for acute infarct. Chronic microvascular ischemic change in the white matter and pons. Progressive changes in the pons since 2016. Negative for hemorrhage or mass. Ventricle size normal. No shift of the midline structures. Vascular: Normal arterial flow voids. Skull and upper cervical spine: Negative Sinuses/Orbits: Negative Other: None IMPRESSION: No acute intracranial abnormality. Chronic microvascular ischemic change in the white matter and pons. Progressive changes in the pons since the prior MRI of 11/24/2014 Electronically Signed   By: Franchot Gallo M.D.   On: 05/23/2016 10:05   US Carotid Bilateral  Result Date: 05/22/2016 CLINICAL DATA:  Syncope EXAM: BILATERAL CAROTID DUPLEX ULTRASOUND TECHNIQUE: Pearline Cables scale imaging,  color Doppler and duplex ultrasound were performed of bilateral carotid and vertebral arteries in the neck. COMPARISON:  None. FINDINGS: Criteria: Quantification of carotid stenosis is based on velocity parameters that correlate the residual internal carotid diameter with NASCET-based stenosis levels, using the diameter of the distal internal carotid lumen as the denominator for stenosis measurement. The following velocity measurements were obtained: RIGHT ICA:  134 cm/sec CCA:  89 cm/sec SYSTOLIC ICA/CCA RATIO:  1.6 DIASTOLIC ICA/CCA RATIO:  2.2 ECA:  195 cm/sec LEFT ICA:  155 cm/sec CCA:  89 cm/sec SYSTOLIC ICA/CCA RATIO:  0.7 DIASTOLIC ICA/CCA RATIO:  2.0 ECA:  231 cm/sec RIGHT CAROTID ARTERY: Moderate focal calcified plaque in the bulb. This causes luminal narrowing on color Doppler imaging. Doppler analysis demonstrates a low resistance pattern. RIGHT VERTEBRAL ARTERY:  Antegrade. LEFT CAROTID ARTERY: Moderate focal calcified  plaque in the bulb. Low resistance internal carotid Doppler pattern. LEFT VERTEBRAL ARTERY:  Antegrade. IMPRESSION: 50-69% stenosis in the right and left internal carotid arteries. Vertebral arteries are antegrade in flow. Electronically Signed   By: Marybelle Killings M.D.   On: 05/22/2016 12:41    Cardiac Studies   None  Patient Profile     60 year old female with known history of coronary artery disease status post drug-eluting stent placement to the right coronary artery in 2014, cardiac catheterization in November 2016 showed patent RCA stent with ostial right PDA stenosis and mild disease affecting the LAD and left circumflex. She has multiple chronic medical conditions that include chronic diastolic heart failure, moderate to severe mitral regurgitation and end-stage renal disease on hemodialysis. She presented with substernal chest pain during dialysis she was also found to have reproducible chest wall pain. There was possible aphasia during the same episode.  Assessment & Plan    1. Chest pain likely of multiple etiologies. She has one component that seems to be clearly musculoskeletal with very tender chest to touch. At the same time, she complains of substernal chest tightness typically when her blood pressure is elevated. The patient had cardiac catheterization last year which showed no obstructive disease and had a stress test done in October which showed no significant change from before. Thus, I favor intensifying his medical therapy. I do think that uncontrolled hypertension is significantly contributing to her symptoms. I increased the dose of Imdur yesterday to 60 mg daily. Consider adding amlodipine.  2. Moderate to severe mitral regurgitation: She does have an MR murmur by exam today which is more prominent. I suspect that this is likely due to her severely elevated blood pressure and this is definitely contributing to her shortness of breath and orthopnea. We need to control her  blood pressure as outlined above. She is going for dialysis today.  3. Chronic diastolic heart failure: Continue fluid removal during dialysis.  4. Essential hypertension: Blood pressure is not controlled. Consider adding amlodipine.  Signed, Kathlyn Sacramento, MD  05/23/2016, 12:19 PM

## 2016-05-23 NOTE — Progress Notes (Signed)
Pt returned from dialysis.  Lt av fistula dressing dry and intact.  No distress on ra.  Denies need at this time.  Daughter at bedside

## 2016-05-23 NOTE — Progress Notes (Signed)
To MRI via bed

## 2016-05-23 NOTE — Progress Notes (Signed)
To dialysis via bed, states nausea little better

## 2016-05-23 NOTE — Progress Notes (Signed)
Subjective:   Patient sent from [place] dialysis for sudden hypotension and possible syncope. Patient states she was undergoing routine dialysis when this happened. She does not remember the complete episode. At the time she felt intense chest pressure but not pain This morning patient is nauseus after her MRI She is short of breath and BP is elevated Urgent HD is requested   Objective:  Vital signs in last 24 hours:  Temp:  [98 F (36.7 C)-98.6 F (37 C)] 98 F (36.7 C) (12/15 1145) Pulse Rate:  [67-78] 73 (12/15 1145) Resp:  [17-26] 26 (12/15 1145) BP: (163-222)/(52-95) 214/67 (12/15 1145) SpO2:  [92 %-100 %] 100 % (12/15 1145) Weight:  [77.8 kg (171 lb 8.3 oz)] 77.8 kg (171 lb 8.3 oz) (12/15 1145)  Weight change:  Filed Weights   05/21/16 1400 05/21/16 2002 05/23/16 1145  Weight: 78 kg (172 lb) 76.2 kg (168 lb 1.6 oz) 77.8 kg (171 lb 8.3 oz)    Intake/Output:    Intake/Output Summary (Last 24 hours) at 05/23/16 1216 Last data filed at 05/22/16 1924  Gross per 24 hour  Intake              480 ml  Output                0 ml  Net              480 ml     Physical Exam: General: NAD,   HEENT Anicteric, moist mucus membranes  Neck supple  Pulm/lungs Crackles b/l  CVS/Heart No rub  Abdomen:  Soft, NT  Extremities: No edema  Neurologic: Alert, oriented  Skin: No rashes  Access: Left arm AVF       Basic Metabolic Panel:   Recent Labs Lab 05/21/16 1405  NA 138  K 4.1  CL 98*  CO2 31  GLUCOSE 232*  BUN 27*  CREATININE 4.98*  CALCIUM 8.4*     CBC:  Recent Labs Lab 05/21/16 1405  WBC 7.5  HGB 11.5*  HCT 33.6*  MCV 99.6  PLT 155      Microbiology:  Recent Results (from the past 720 hour(s))  MRSA PCR Screening     Status: None   Collection Time: 05/21/16  8:23 PM  Result Value Ref Range Status   MRSA by PCR NEGATIVE NEGATIVE Final    Comment:        The GeneXpert MRSA Assay (FDA approved for NASAL specimens only), is one component of  a comprehensive MRSA colonization surveillance program. It is not intended to diagnose MRSA infection nor to guide or monitor treatment for MRSA infections.     Coagulation Studies: No results for input(s): LABPROT, INR in the last 72 hours.  Urinalysis: No results for input(s): COLORURINE, LABSPEC, PHURINE, GLUCOSEU, HGBUR, BILIRUBINUR, KETONESUR, PROTEINUR, UROBILINOGEN, NITRITE, LEUKOCYTESUR in the last 72 hours.  Invalid input(s): APPERANCEUR    Imaging: Dg Chest 2 View  Result Date: 05/21/2016 CLINICAL DATA:  60 year old female with a history of left-sided chest pain EXAM: CHEST  2 VIEW COMPARISON:  02/22/2016, 12/30/2015 FINDINGS: Cardiomediastinal silhouette unchanged. Calcifications of the aortic arch. Overall aeration is improved when compared to the most recent chest x-ray. No confluent airspace disease or pneumothorax. No evidence of pleural effusion. Coarsened appearance of the interstitium with low lung volumes. No displaced fracture. IMPRESSION: Improved aeration of the lungs compared to the most recent chest x-ray of September, with coarsened interstitium potentially representing chronic changes. No evidence of lobar pneumonia or  pleural effusion. Aortic atherosclerosis. Signed, Dulcy Fanny. Earleen Newport, DO [facility] Electronically Signed   By: Corrie Mckusick D.O.   On: 05/21/2016 14:50   Ct Head Wo Contrast  Result Date: 05/22/2016 CLINICAL DATA:  Syncope yesterday with nausea during dialysis. EXAM: CT HEAD WITHOUT CONTRAST TECHNIQUE: Contiguous axial images were obtained from the base of the skull through the vertex without intravenous contrast. COMPARISON:  Multiple exams, including 11/23/2014 FINDINGS: Brain: The brainstem, cerebellum, cerebral peduncles, thalami, basal ganglia, basilar cisterns, and ventricular system appear within normal limits. No intracranial hemorrhage, mass lesion, or acute CVA. Chronic an  probably physiologic calcification of the globus pallidus nuclei observed. Periventricular white matter and corona radiata hypodensities favor chronic ischemic microvascular white matter disease. Vascular: There is atherosclerotic calcification of the cavernous carotid arteries bilaterally. Skull: Unremarkable Sinuses/Orbits: Unremarkable Other: No supplemental non-categorized findings. IMPRESSION: 1. No acute intracranial findings. 2. Periventricular white matter and corona radiata hypodensities favor chronic ischemic microvascular white matter disease. Electronically Signed   By: Van Clines M.D.   On: 05/22/2016 11:40   Mr Brain Wo Contrast  Result Date: 05/23/2016 CLINICAL DATA:  Syncope post dialysis EXAM: MRI HEAD WITHOUT CONTRAST TECHNIQUE: Multiplanar, multiecho pulse sequences of the brain and surrounding structures were obtained without intravenous contrast. COMPARISON:  CT 05/22/2016.  MRI 11/24/2014 FINDINGS: Brain: Negative for acute infarct. Chronic microvascular ischemic change in the white matter and pons. Progressive changes in the pons since 2016. Negative for hemorrhage or mass. Ventricle size normal. No shift of the midline structures. Vascular: Normal arterial flow voids. Skull and upper cervical spine: Negative Sinuses/Orbits: Negative Other: None IMPRESSION: No acute intracranial abnormality. Chronic microvascular ischemic change in the white matter and pons. Progressive changes in the pons since the prior MRI of 11/24/2014 Electronically Signed   By: Franchot Gallo M.D.   On: 05/23/2016 10:05   US Carotid Bilateral  Result Date: 05/22/2016 CLINICAL DATA:  Syncope EXAM: BILATERAL CAROTID DUPLEX ULTRASOUND TECHNIQUE: Pearline Cables scale imaging, color Doppler and duplex ultrasound were performed of bilateral carotid and vertebral arteries in the neck. COMPARISON:  None. FINDINGS: Criteria: Quantification of carotid stenosis is based on velocity parameters that correlate the residual  internal carotid diameter with NASCET-based stenosis levels, using the diameter of the distal internal carotid lumen as the denominator for stenosis measurement. The following velocity measurements were obtained: RIGHT ICA:  134 cm/sec CCA:  89 cm/sec SYSTOLIC ICA/CCA RATIO:  1.6 DIASTOLIC ICA/CCA RATIO:  2.2 ECA:  195 cm/sec LEFT ICA:  155 cm/sec CCA:  89 cm/sec SYSTOLIC ICA/CCA RATIO:  0.7 DIASTOLIC ICA/CCA RATIO:  2.0 ECA:  231 cm/sec RIGHT CAROTID ARTERY: Moderate focal calcified plaque in the bulb. This causes luminal narrowing on color Doppler imaging. Doppler analysis demonstrates a low resistance pattern. RIGHT VERTEBRAL ARTERY:  Antegrade. LEFT CAROTID ARTERY: Moderate focal calcified plaque in the bulb. Low resistance internal carotid Doppler pattern. LEFT VERTEBRAL ARTERY:  Antegrade. IMPRESSION: 50-69% stenosis in the right and left internal carotid arteries. Vertebral arteries are antegrade in flow. Electronically Signed   By: Marybelle Killings M.D.   On: 05/22/2016 12:41     Medications:    . aspirin EC  81 mg Oral Daily  . calcium acetate  [phone] mg Oral TID WC  . carvedilol  25 mg Oral BID WC  . cloNIDine  0.1 mg Oral BID  . furosemide  80 mg Oral Once per day on Sun Tue Thu Sat  . heparin subcutaneous  5,000 Units  Subcutaneous Q8H  . insulin aspart  0-5 Units Subcutaneous QHS  . insulin aspart  0-9 Units Subcutaneous TID WC  . isosorbide mononitrate  60 mg Oral Daily  . losartan  50 mg Oral Daily  . multivitamin  1 tablet Oral Daily  . pregabalin  75 mg Oral Daily  . rosuvastatin  40 mg Oral QHS   acetaminophen, albuterol, ibuprofen, Melatonin, nitroGLYCERIN, ondansetron (ZOFRAN) IV, oxyCODONE-acetaminophen, promethazine  Assessment/ Plan:  60 y.o.African American female  with history of diabetes type 2 with severe complications including diabetic retinopathy, neuropathy,nephropathy, long standing hypertension, COPD, hyperlipidemia, congestive heart failure  with EF  45-50%  MWF [place].   1. Shortness of breath and HTN 2. ESRD 3. SHPTH 4. Syncope  Plan: Urgent HD today BP high inpatient - mostly isolated systolic Echo from sep show Mild AS and mod to severe MR.  Home meds restarted MRI and EEG today; neurology evaluation Will follow    LOS: 0 [place] 12/15/201712:16 PM

## 2016-05-23 NOTE — Progress Notes (Signed)
Back from MRI, pt threw up in MRI, complaining of nausea and  Unable to breath, requesting O2.  Zofran 4mg  IV given, 2LO2 per [place] applied, oxygen sat 94 on 2L

## 2016-05-23 NOTE — Progress Notes (Signed)
[facility] [place] NAME: Theresa [name]    MR#:  [id number]  DATE OF BIRTH:  1955-08-29  SUBJECTIVE:  CHIEF COMPLAINT:  Patient Had a to 3 episodes of vomiting today at MRI. Not feeling good. Denies any abdominal pain  REVIEW OF SYSTEMS:  CONSTITUTIONAL: No fever, fatigue or weakness.  EYES: No blurred or double vision.  EARS, NOSE, AND THROAT: No tinnitus or ear pain.  RESPIRATORY: No cough, shortness of breath, wheezing or hemoptysis.  CARDIOVASCULAR: reporting midsternal chest pain, denies orthopnea, edema.  GASTROINTESTINAL: Reports vomiting, denies diarrhea or abdominal pain.  GENITOURINARY: No dysuria, hematuria.  ENDOCRINE: No polyuria, nocturia,  HEMATOLOGY: No anemia, easy bruising or bleeding SKIN: No rash or lesion. MUSCULOSKELETAL: No joint pain or arthritis.   NEUROLOGIC: No tingling, numbness, weakness.  PSYCHIATRY: No anxiety or depression.   DRUG ALLERGIES:   Allergies  Allergen Reactions  . Hydrocodone Hives  . Metformin Diarrhea    VITALS:  Blood pressure (!) 201/54, pulse 78, temperature 98.2 F (36.8 C), temperature source Oral, resp. rate 14, height 5' 7.5" (1.715 m), weight 73.9 kg (162 lb 14.4 oz), SpO2 92 %.  PHYSICAL EXAMINATION:  GENERAL:  60 y.o.-year-old patient lying in the bed with no acute distress.  EYES: Pupils equal, round, reactive to light and accommodation. No scleral icterus. Extraocular muscles intact.  HEENT: Head atraumatic, normocephalic. Oropharynx and nasopharynx clear.  NECK:  Supple, no jugular venous distention. No thyroid enlargement, no tenderness.  LUNGS: Normal breath sounds bilaterally, no wheezing, rales,rhonchi or crepitation. No use of accessory muscles of respiration.  CARDIOVASCULAR: S1, S2 normal. No murmurs, rubs, or gallops. Reproducible midsternal chest wall tenderness ABDOMEN: Soft, nontender, nondistended. Bowel sounds present. No organomegaly or mass.   EXTREMITIES: No pedal edema, cyanosis, or clubbing.  NEUROLOGIC: Cranial nerves II through XII are intact. Muscle strength 5/5 in all extremities. Sensation intact. Gait not checked.  PSYCHIATRIC: The patient is alert and oriented x 3.  SKIN: No obvious rash, lesion, or ulcer.    LABORATORY PANEL:   CBC  Recent Labs Lab 05/21/16 1405  WBC 7.5  HGB 11.5*  HCT 33.6*  PLT 155   ------------------------------------------------------------------------------------------------------------------  Chemistries   Recent Labs Lab 05/21/16 1405  NA 138  K 4.1  CL 98*  CO2 31  GLUCOSE 232*  BUN 27*  CREATININE 4.98*  CALCIUM 8.4*   ------------------------------------------------------------------------------------------------------------------  Cardiac Enzymes  Recent Labs Lab 05/22/16 0147  TROPONINI 0.03*   ------------------------------------------------------------------------------------------------------------------  RADIOLOGY:  Ct Head Wo Contrast  Result Date: 05/22/2016 CLINICAL DATA:  Syncope yesterday with nausea during dialysis. EXAM: CT HEAD WITHOUT CONTRAST TECHNIQUE: Contiguous axial images were obtained from the base of the skull through the vertex without intravenous contrast. COMPARISON:  Multiple exams, including 11/23/2014 FINDINGS: Brain: The brainstem, cerebellum, cerebral peduncles, thalami, basal ganglia, basilar cisterns, and ventricular system appear within normal limits. No intracranial hemorrhage, mass lesion, or acute CVA. Chronic an probably physiologic calcification of the globus pallidus nuclei observed. Periventricular white matter and corona radiata hypodensities favor chronic ischemic microvascular white matter disease. Vascular: There is atherosclerotic calcification of the cavernous carotid arteries bilaterally. Skull: Unremarkable Sinuses/Orbits: Unremarkable Other: No supplemental non-categorized findings. IMPRESSION: 1. No acute  intracranial findings. 2. Periventricular white matter and corona radiata hypodensities favor chronic ischemic microvascular white matter disease. Electronically Signed   By: Van Clines M.D.   On: 05/22/2016 11:40   Mr Brain Wo Contrast  Result Date: 05/23/2016 CLINICAL DATA:  Syncope post dialysis EXAM: MRI HEAD WITHOUT CONTRAST TECHNIQUE: Multiplanar, multiecho pulse sequences of the brain and surrounding structures were obtained without intravenous contrast. COMPARISON:  CT 05/22/2016.  MRI 11/24/2014 FINDINGS: Brain: Negative for acute infarct. Chronic microvascular ischemic change in the white matter and pons. Progressive changes in the pons since 2016. Negative for hemorrhage or mass. Ventricle size normal. No shift of the midline structures. Vascular: Normal arterial flow voids. Skull and upper cervical spine: Negative Sinuses/Orbits: Negative Other: None IMPRESSION: No acute intracranial abnormality. Chronic microvascular ischemic change in the white matter and pons. Progressive changes in the pons since the prior MRI of 11/24/2014 Electronically Signed   By: Franchot Gallo M.D.   On: 05/23/2016 10:05   US Carotid Bilateral  Result Date: 05/22/2016 CLINICAL DATA:  Syncope EXAM: BILATERAL CAROTID DUPLEX ULTRASOUND TECHNIQUE: Pearline Cables scale imaging, color Doppler and duplex ultrasound were performed of bilateral carotid and vertebral arteries in the neck. COMPARISON:  None. FINDINGS: Criteria: Quantification of carotid stenosis is based on velocity parameters that correlate the residual internal carotid diameter with NASCET-based stenosis levels, using the diameter of the distal internal carotid lumen as the denominator for stenosis measurement. The following velocity measurements were obtained: RIGHT ICA:  134 cm/sec CCA:  89 cm/sec SYSTOLIC ICA/CCA RATIO:  1.6 DIASTOLIC ICA/CCA RATIO:  2.2 ECA:  195 cm/sec LEFT ICA:  155 cm/sec CCA:  89 cm/sec SYSTOLIC ICA/CCA RATIO:  0.7 DIASTOLIC ICA/CCA  RATIO:  2.0 ECA:  231 cm/sec RIGHT CAROTID ARTERY: Moderate focal calcified plaque in the bulb. This causes luminal narrowing on color Doppler imaging. Doppler analysis demonstrates a low resistance pattern. RIGHT VERTEBRAL ARTERY:  Antegrade. LEFT CAROTID ARTERY: Moderate focal calcified plaque in the bulb. Low resistance internal carotid Doppler pattern. LEFT VERTEBRAL ARTERY:  Antegrade. IMPRESSION: 50-69% stenosis in the right and left internal carotid arteries. Vertebral arteries are antegrade in flow. Electronically Signed   By: Marybelle Killings M.D.   On: 05/22/2016 12:41    EKG:   Orders placed or performed during the hospital encounter of 05/21/16  . ED EKG within 10 minutes  . ED EKG within 10 minutes    ASSESSMENT AND PLAN:   patient came into the emergency department as she had episode of chest pain during dialysis and also patient is reporting that she passed out at the time. Patient has reported aphasia during HD YESTERDAY, to the cardiologist  #syncope /TIA Telemetry monitoring CT head and MRI brain are negative Carotid Dopplers with right and left ICA 50-69% stenosis close monitoring as an outpatient EKG pending Recent echocardiogram in September 2017 has revealed EF 55-60% Check TSH 1.685 LDL 89 Currently she is on Crestor changing to atorvastatin 40 mg once daily EEG is pending if it is normal neurology have no recommendations PT, OT and speech therapy evaluation  Patient is on aspirin Patient passed bedside swallow evaluation  #Hypertensive urgency Resume home medications clonidine, Coreg and Lasix and titrate as needed Imdur 60 mg is added to the regimen. Will monitor and titrate as needed Amlodipine 5 mg is added and clonid is increased to 0.2 mg twice a day  #Nausea and vomiting Supportive treatment Antiemetics and GI prophylaxis Patient denies any abdominal pain at this time  # atypical chest pain with history of coronary artery disease status post stent  placement Seems to be musculoskeletal as it is reproducible Telemetry  ruled out acute MI with negative troponins  Recent echocardiogram has revealed 55-60% ejection fraction in September 2017 Continue her home  medication aspirin, Coreg, Crestor Appreciate Dr. Fletcher Anon Recommendations   #End stage renal disease on hemodialysis Monday, Wednesday, Friday Patient had hemodialysis today Follow up with nephrology  #Diabetes mellitus Diabetic diet and sliding scale insulin   #Chronic COPD no exacerbation Continue home medications including inhalers    All the records are reviewed and case discussed with Care Management/Social Workerr. Management plans discussed with the patient, family and they are in agreement.  CODE STATUS: fc   TOTAL TIME TAKING CARE OF THIS PATIENT: 37 minutes.   POSSIBLE D/C IN 1-2  DAYS, DEPENDING ON CLINICAL CONDITION.  Note: This dictation was prepared with Dragon dictation along with smaller phrase technology. Any transcriptional errors that result from this process are unintentional.   Nicholes Mango M.D on 05/23/2016 at 4:56 PM  Between 7am to 6pm - Pager - [phone] After 6pm go to [email or web address] - password [email or web address] [facility]  Office  [phone]  CC: Primary care physician; Mable Paris, FNP

## 2016-05-23 NOTE — Progress Notes (Signed)
SLP Cancellation Note  Patient Details Name: Theresa [name] MRN: [id number] DOB: November 08, 1955   Cancelled treatment:       Reason Eval/Treat Not Completed: Patient at procedure or test/unavailable (Pt at Dialysis now. Reviewed chart notes; consulted NSG). NSG indicated no concerns re: swallowing stating pt is swallowing pills well w/ water; no c/o difficulty eating her meals.  ST services will f/u w/ pt tomorrow to determine needs. Recommend general aspiration precautions. NSG agreed.   Orinda Kenner, MS, CCC-SLP Yasmin Dibello 05/23/2016, 3:06 PM

## 2016-05-23 NOTE — Progress Notes (Signed)
Dr. Margaretmary Eddy and Dr. Candiss Norse in to see pt, aware of elevated BP 222/95.  Pt to go for dialysis this am.

## 2016-05-23 NOTE — Progress Notes (Signed)
Post hd vitals 

## 2016-05-23 NOTE — Progress Notes (Addendum)
Subjective: Patient now with nausea and s/p vomiting after MRI.  BP elevated.  No new neurological complaints.  Objective: Current vital signs: BP (!) 222/95   Pulse 78   Temp 98.6 F (37 C) (Oral)   Resp 17   Ht 5' 7.5" (1.715 m)   Wt 76.2 kg (168 lb 1.6 oz)   SpO2 95%   BMI 25.94 kg/m  Vital signs in last 24 hours: Temp:  [98.4 F (36.9 C)-98.6 F (37 C)] 98.6 F (37 C) (12/15 0737) Pulse Rate:  [67-78] 78 (12/15 1027) Resp:  [17-18] 17 (12/15 0737) BP: (163-222)/(52-95) 222/95 (12/15 1027) SpO2:  [92 %-97 %] 95 % (12/15 1027)  Intake/Output from previous day: 12/14 0701 - 12/15 0700 In: 960 [P.O.:960] Out: 0  Intake/Output this shift: No intake/output data recorded. Nutritional status: Diet Heart Room service appropriate? Yes; Fluid consistency: Thin  Neurologic Exam: Mental Status: Alert, oriented, thought content appropriate.  Speech fluent without evidence of aphasia.  Able to follow 3 step commands without difficulty. Cranial Nerves: II: Discs flat bilaterally; Visual fields grossly normal, pupils equal, round, reactive to light and accommodation III,IV, VI: ptosis not present, extra-ocular motions intact bilaterally V,VII: smile symmetric, facial light touch sensation normal bilaterally VIII: hearing normal bilaterally IX,X: gag reflex present XI: bilateral shoulder shrug XII: midline tongue extension Motor: Right :  Upper extremity   5/5                                      Left:     Upper extremity   5/5             Lower extremity   5/5                                                  Lower extremity   5/5  Lab Results: Basic Metabolic Panel:  Recent Labs Lab 05/21/16 1405  NA 138  K 4.1  CL 98*  CO2 31  GLUCOSE 232*  BUN 27*  CREATININE 4.98*  CALCIUM 8.4*    Liver Function Tests: No results for input(s): AST, ALT, ALKPHOS, BILITOT, PROT, ALBUMIN in the last 168 hours. No results for input(s): LIPASE, AMYLASE in the last 168 hours. No  results for input(s): AMMONIA in the last 168 hours.  CBC:  Recent Labs Lab 05/21/16 1405  WBC 7.5  HGB 11.5*  HCT 33.6*  MCV 99.6  PLT 155    Cardiac Enzymes:  Recent Labs Lab 05/21/16 1405 05/21/16 1655 05/21/16 2014 05/21/16 2246 05/22/16 0147  TROPONINI <0.03 0.03* <0.03 <0.03 0.03*    Lipid Panel:  Recent Labs Lab 05/22/16 0147  CHOL 161  TRIG 65  HDL 59  CHOLHDL 2.7  VLDL 13  LDLCALC 89    CBG:  Recent Labs Lab 05/22/16 0734 05/22/16 1242 05/22/16 1622 05/22/16 2105 05/23/16 0753  GLUCAP 164* 169* 106* 237* 122*    Microbiology: Results for orders placed or performed during the hospital encounter of 05/21/16  MRSA PCR Screening     Status: None   Collection Time: 05/21/16  8:23 PM  Result Value Ref Range Status   MRSA by PCR NEGATIVE NEGATIVE Final    Comment:        The GeneXpert MRSA Assay (  FDA approved for NASAL specimens only), is one component of a comprehensive MRSA colonization surveillance program. It is not intended to diagnose MRSA infection nor to guide or monitor treatment for MRSA infections.     Coagulation Studies: No results for input(s): LABPROT, INR in the last 72 hours.  Imaging: Dg Chest 2 View  Result Date: 05/21/2016 CLINICAL DATA:  60 year old female with a history of left-sided chest pain EXAM: CHEST  2 VIEW COMPARISON:  02/22/2016, 12/30/2015 FINDINGS: Cardiomediastinal silhouette unchanged. Calcifications of the aortic arch. Overall aeration is improved when compared to the most recent chest x-ray. No confluent airspace disease or pneumothorax. No evidence of pleural effusion. Coarsened appearance of the interstitium with low lung volumes. No displaced fracture. IMPRESSION: Improved aeration of the lungs compared to the most recent chest x-ray of September, with coarsened interstitium potentially representing chronic changes. No evidence of lobar pneumonia or pleural effusion. Aortic atherosclerosis. Signed,  Dulcy Fanny. Earleen Newport, DO [facility] Radiology Electronically Signed   By: Corrie Mckusick D.O.   On: 05/21/2016 14:50   Ct Head Wo Contrast  Result Date: 05/22/2016 CLINICAL DATA:  Syncope yesterday with nausea during dialysis. EXAM: CT HEAD WITHOUT CONTRAST TECHNIQUE: Contiguous axial images were obtained from the base of the skull through the vertex without intravenous contrast. COMPARISON:  Multiple exams, including 11/23/2014 FINDINGS: Brain: The brainstem, cerebellum, cerebral peduncles, thalami, basal ganglia, basilar cisterns, and ventricular system appear within normal limits. No intracranial hemorrhage, mass lesion, or acute CVA. Chronic an probably physiologic calcification of the globus pallidus nuclei observed. Periventricular white matter and corona radiata hypodensities favor chronic ischemic microvascular white matter disease. Vascular: There is atherosclerotic calcification of the cavernous carotid arteries bilaterally. Skull: Unremarkable Sinuses/Orbits: Unremarkable Other: No supplemental non-categorized findings. IMPRESSION: 1. No acute intracranial findings. 2. Periventricular white matter and corona radiata hypodensities favor chronic ischemic microvascular white matter disease. Electronically Signed   By: Van Clines M.D.   On: 05/22/2016 11:40   Mr Brain Wo Contrast  Result Date: 05/23/2016 CLINICAL DATA:  Syncope post dialysis EXAM: MRI HEAD WITHOUT CONTRAST TECHNIQUE: Multiplanar, multiecho pulse sequences of the brain and surrounding structures were obtained without intravenous contrast. COMPARISON:  CT 05/22/2016.  MRI 11/24/2014 FINDINGS: Brain: Negative for acute infarct. Chronic microvascular ischemic change in the white matter and pons. Progressive changes in the pons since 2016. Negative for hemorrhage or mass. Ventricle size normal. No shift of the midline structures. Vascular: Normal arterial flow voids. Skull and upper  cervical spine: Negative Sinuses/Orbits: Negative Other: None IMPRESSION: No acute intracranial abnormality. Chronic microvascular ischemic change in the white matter and pons. Progressive changes in the pons since the prior MRI of 11/24/2014 Electronically Signed   By: Franchot Gallo M.D.   On: 05/23/2016 10:05   US Carotid Bilateral  Result Date: 05/22/2016 CLINICAL DATA:  Syncope EXAM: BILATERAL CAROTID DUPLEX ULTRASOUND TECHNIQUE: Pearline Cables scale imaging, color Doppler and duplex ultrasound were performed of bilateral carotid and vertebral arteries in the neck. COMPARISON:  None. FINDINGS: Criteria: Quantification of carotid stenosis is based on velocity parameters that correlate the residual internal carotid diameter with NASCET-based stenosis levels, using the diameter of the distal internal carotid lumen as the denominator for stenosis measurement. The following velocity measurements were obtained: RIGHT ICA:  134 cm/sec CCA:  89 cm/sec SYSTOLIC ICA/CCA RATIO:  1.6 DIASTOLIC ICA/CCA RATIO:  2.2 ECA:  195 cm/sec LEFT ICA:  155 cm/sec CCA:  89 cm/sec SYSTOLIC ICA/CCA RATIO:  0.7 DIASTOLIC ICA/CCA RATIO:  2.0 ECA:  231 cm/sec RIGHT CAROTID ARTERY: Moderate focal calcified plaque in the bulb. This causes luminal narrowing on color Doppler imaging. Doppler analysis demonstrates a low resistance pattern. RIGHT VERTEBRAL ARTERY:  Antegrade. LEFT CAROTID ARTERY: Moderate focal calcified plaque in the bulb. Low resistance internal carotid Doppler pattern. LEFT VERTEBRAL ARTERY:  Antegrade. IMPRESSION: 50-69% stenosis in the right and left internal carotid arteries. Vertebral arteries are antegrade in flow. Electronically Signed   By: Marybelle Killings M.D.   On: 05/22/2016 12:41    Medications:  I have reviewed the patient's current medications. Scheduled: . aspirin EC  81 mg Oral Daily  . calcium acetate  [phone] mg Oral TID WC  . carvedilol  25 mg Oral BID WC  . cloNIDine  0.1 mg Oral BID  . furosemide  80  mg Oral Once per day on Sun Tue Thu Sat  . heparin subcutaneous  5,000 Units Subcutaneous Q8H  . insulin aspart  0-5 Units Subcutaneous QHS  . insulin aspart  0-9 Units Subcutaneous TID WC  . isosorbide mononitrate  60 mg Oral Daily  . losartan  50 mg Oral Daily  . multivitamin  1 tablet Oral Daily  . pregabalin  75 mg Oral Daily  . rosuvastatin  40 mg Oral QHS    Assessment/Plan: No new neurological complaints.  MRI of the brain reviewed and shows no acute changes.  EEG pending.    Recommendations: 1.  If EEG unremarkable, no further neurological intervention is recommended at this time.    LOS: 0 days   Alexis Goodell, MD Neurology [phone] 05/23/2016  11:31 AM

## 2016-05-23 NOTE — Care Management (Signed)
Informed that patient is chronic dialysis patient.  She receives treatments at [facility] on [address]  Notified Cheryl with [facility]. Uses [facility] for transport

## 2016-05-23 NOTE — Progress Notes (Signed)
Pre-hd tx 

## 2016-05-23 NOTE — Progress Notes (Signed)
PT Cancellation Note  Patient Details Name: Theresa [name] MRN: [id number] DOB: January 17, 1956   Cancelled Treatment:    Reason Eval/Treat Not Completed: Other (comment). Consult received and chart reviewed. Pt currently out of room for HD at this time. Unavailable for therapy. Will re-attempt next date.   Meya Clutter 05/23/2016, 12:33 PM Greggory Stallion, PT, DPT [phone]

## 2016-05-23 NOTE — Progress Notes (Signed)
Post hd assessment 

## 2016-05-23 NOTE — Progress Notes (Signed)
Hd start 

## 2016-05-23 NOTE — Progress Notes (Signed)
Pre hd assessment  

## 2016-05-24 DIAGNOSIS — N2581 Secondary hyperparathyroidism of renal origin: Secondary | ICD-10-CM | POA: Diagnosis not present

## 2016-05-24 DIAGNOSIS — N186 End stage renal disease: Secondary | ICD-10-CM | POA: Diagnosis not present

## 2016-05-24 DIAGNOSIS — I10 Essential (primary) hypertension: Secondary | ICD-10-CM | POA: Diagnosis not present

## 2016-05-24 DIAGNOSIS — J810 Acute pulmonary edema: Secondary | ICD-10-CM | POA: Diagnosis not present

## 2016-05-24 DIAGNOSIS — R112 Nausea with vomiting, unspecified: Secondary | ICD-10-CM | POA: Diagnosis not present

## 2016-05-24 DIAGNOSIS — R079 Chest pain, unspecified: Secondary | ICD-10-CM | POA: Diagnosis not present

## 2016-05-24 LAB — HEPATITIS B SURFACE ANTIGEN: Hepatitis B Surface Ag: NEGATIVE

## 2016-05-24 LAB — GLUCOSE, CAPILLARY
Glucose-Capillary: 212 mg/dL — ABNORMAL HIGH (ref 65–99)
Glucose-Capillary: 93 mg/dL (ref 65–99)

## 2016-05-24 MED ORDER — FUROSEMIDE 40 MG PO TABS: 80.0000 mg | ORAL_TABLET | ORAL | Status: DC

## 2016-05-24 MED ORDER — OMEPRAZOLE MAGNESIUM 20 MG PO TBEC: 20.0000 mg | DELAYED_RELEASE_TABLET | Freq: Every day | ORAL | Status: DC

## 2016-05-24 MED ORDER — CLONIDINE HCL 0.2 MG PO TABS: 0.2000 mg | ORAL_TABLET | Freq: Two times a day (BID) | ORAL | 0 refills | Status: DC

## 2016-05-24 MED ORDER — ISOSORBIDE MONONITRATE ER 60 MG PO TB24: 60.0000 mg | ORAL_TABLET | Freq: Every day | ORAL | 0 refills | Status: DC

## 2016-05-24 MED ORDER — BUTALBITAL-APAP-CAFFEINE 50-325-40 MG PO TABS
1.0000 | ORAL_TABLET | Freq: Once | ORAL | Status: AC
  Administered 2016-05-24: 1 via ORAL
  Filled 2016-05-24: qty 1

## 2016-05-24 MED ORDER — ATORVASTATIN CALCIUM 40 MG PO TABS: 40.0000 mg | ORAL_TABLET | Freq: Every day | ORAL | 0 refills | Status: DC

## 2016-05-24 MED ORDER — ACETAMINOPHEN 325 MG PO TABS: 650.0000 mg | ORAL_TABLET | ORAL | Status: DC | PRN

## 2016-05-24 NOTE — Progress Notes (Signed)
Subjective:   Patient sent from [place] dialysis for sudden hypotension and possible syncope. Patient states she was undergoing routine dialysis when this happened. She does not remember the complete episode. 3000 cc removed with HD yesterday BP is much better controlled   Objective:  Vital signs in last 24 hours:  Temp:  [98.1 F (36.7 C)-98.4 F (36.9 C)] 98.4 F (36.9 C) (12/16 1138) Pulse Rate:  [64-78] 64 (12/16 1138) Resp:  [12-20] 16 (12/16 1138) BP: (113-226)/(38-69) 113/47 (12/16 1138) SpO2:  [92 %-100 %] 95 % (12/16 1138) Weight:  [73.9 kg (162 lb 14.4 oz)-74.9 kg (165 lb 2 oz)] 73.9 kg (162 lb 14.4 oz) (12/15 1544)  Weight change:  Filed Weights   05/23/16 1145 05/23/16 1500 05/23/16 1544  Weight: 77.8 kg (171 lb 8.3 oz) 74.9 kg (165 lb 2 oz) 73.9 kg (162 lb 14.4 oz)    Intake/Output:    Intake/Output Summary (Last 24 hours) at 05/24/16 1229 Last data filed at 05/24/16 1222  Gross per 24 hour  Intake                0 ml  Output             3000 ml  Net            -3000 ml     Physical Exam: General: NAD,   HEENT Anicteric, moist mucus membranes  Neck supple  Pulm/lungs Clear b/l  CVS/Heart No rub  Abdomen:  Soft, NT  Extremities: No edema  Neurologic: Alert, oriented  Skin: No rashes  Access: Left arm AVF       Basic Metabolic Panel:   Recent Labs Lab 05/21/16 1405  NA 138  K 4.1  CL 98*  CO2 31  GLUCOSE 232*  BUN 27*  CREATININE 4.98*  CALCIUM 8.4*     CBC:  Recent Labs Lab 05/21/16 1405  WBC 7.5  HGB 11.5*  HCT 33.6*  MCV 99.6  PLT 155      Microbiology:  Recent Results (from the past 720 hour(s))  MRSA PCR Screening     Status: None   Collection Time: 05/21/16  8:23 PM  Result Value Ref Range Status   MRSA by PCR NEGATIVE NEGATIVE Final    Comment:        The GeneXpert MRSA Assay (FDA approved for NASAL specimens only), is one component of a comprehensive MRSA colonization surveillance program. It is  not intended to diagnose MRSA infection nor to guide or monitor treatment for MRSA infections.     Coagulation Studies: No results for input(s): LABPROT, INR in the last 72 hours.  Urinalysis: No results for input(s): COLORURINE, LABSPEC, PHURINE, GLUCOSEU, HGBUR, BILIRUBINUR, KETONESUR, PROTEINUR, UROBILINOGEN, NITRITE, LEUKOCYTESUR in the last 72 hours.  Invalid input(s): APPERANCEUR    Imaging: Mr Brain Wo Contrast  Result Date: 05/23/2016 CLINICAL DATA:  Syncope post dialysis EXAM: MRI HEAD WITHOUT CONTRAST TECHNIQUE: Multiplanar, multiecho pulse sequences of the brain and surrounding structures were obtained without intravenous contrast. COMPARISON:  CT 05/22/2016.  MRI 11/24/2014 FINDINGS: Brain: Negative for acute infarct. Chronic microvascular ischemic change in the white matter and pons. Progressive changes in the pons since 2016. Negative for hemorrhage or mass. Ventricle size normal. No shift of the midline structures. Vascular: Normal arterial flow voids. Skull and upper cervical spine: Negative Sinuses/Orbits: Negative Other: None IMPRESSION: No acute intracranial abnormality. Chronic microvascular ischemic change in the white matter and pons. Progressive changes in the pons since the prior MRI  of 11/24/2014 Electronically Signed   By: Franchot Gallo M.D.   On: 05/23/2016 10:05     Medications:    . amLODipine  5 mg Oral Daily  . aspirin EC  81 mg Oral Daily  . atorvastatin  40 mg Oral q1800  . calcium acetate  [phone] mg Oral TID WC  . carvedilol  25 mg Oral BID WC  . cloNIDine  0.2 mg Oral BID  . furosemide  80 mg Oral BID  . heparin subcutaneous  5,000 Units Subcutaneous Q8H  . insulin aspart  0-5 Units Subcutaneous QHS  . insulin aspart  0-9 Units Subcutaneous TID WC  . isosorbide mononitrate  60 mg Oral Daily  . losartan  50 mg Oral Daily  . multivitamin  1 tablet Oral Daily  . pregabalin  75 mg Oral Daily   acetaminophen, albuterol, ibuprofen,  Melatonin, nitroGLYCERIN, ondansetron (ZOFRAN) IV, oxyCODONE-acetaminophen, promethazine  Assessment/ Plan:  61 y.o.African American female  with history of diabetes type 2 with severe complications including diabetic retinopathy, neuropathy,nephropathy, long standing hypertension, COPD, hyperlipidemia, congestive heart failure  with EF 45-50%  MWF [place].   1. Acute pulmonary edema and HTN 2. ESRD 3. SHPTH 4. Syncope  Plan: 3000 cc removed with HD Amlodipine added Good effect; BP better controlled Take BP Pills as follows Morning - Losartan, clonidine and Coreg Evening- Clinodine, coreg and amlodipine probably symptoms all related to drop in BP during HD Ok to discharge from renal standpoint   LOS: 0 Theresa [name] 12/16/201712:29 PM

## 2016-05-24 NOTE — Discharge Summary (Signed)
[facility] NAME: Theresa [name]    MR#:  [id number]  DATE OF BIRTH:  April 07, 1956  DATE OF ADMISSION:  05/21/2016 ADMITTING PHYSICIAN: Nicholes Mango, MD  DATE OF DISCHARGE: 05/24/16 PRIMARY CARE PHYSICIAN: Mable Paris, FNP    ADMISSION DIAGNOSIS:  Capsulitis of ankle, right [M77.51] Chronic pain of right ankle [M25.571, G89.29] Synovitis of right ankle [M65.9] Chest pain, unspecified type [R07.9] Edema of right ankle [M25.471]  DISCHARGE DIAGNOSIS:  Chest pain Near Syncope Accelerated hypertension  SECONDARY DIAGNOSIS:   Past Medical History:  Diagnosis Date  . Anemia of chronic disease   . Carotid arterial disease ([place])    a. 02/2013 U/S: 40-59% bilat ICA stenosis - *f/u 02/2014*  . Chronic constipation   . Chronic diastolic CHF (congestive heart failure) ([place])    a. 10/2013 Echo [facility]): EF 55-60%, mod conc LVH, mod MR, mildly dil LA, mild Ao sclerosis w/o stenosis.  . Colon polyps   . COPD (chronic obstructive pulmonary disease) ([place])   . Coronary artery disease    a. 05/2013 NSTEMI/PCI: LM 20d, LAD min irregs, LCX small, nl, OM1 nl, RCA dom 98m (2.5x16 Promus DES), PDA1 80p.  . Diabetes mellitus   . Diabetic neuropathy ([place])   . Diabetic retinopathy ([place]) 05/28/2013   Hx bilat retinal detachment, proliferative diab retinopathy and bilat vitreous hemorrhage   . Emphysema   . ESRD on hemodialysis ([place])    a. [facility] in [place], [place], on a TTS schedule.  She started dialysis in Feb 2014.  Etiology of renal failure not known, likely diabetes.  Has a left upper arm AV graft.  . History of bronchitis    Mar 2012  . History of pneumonia    June 2012  . History of tobacco abuse    a. Quit 2012.  Marland Kitchen Hyperlipidemia   . Hypertension   . Moderate mitral insufficiency    a. 10/2013 Echo: EF 55-60%, mod MR.  . Peripheral vascular disease ([place])   . Sickle cell trait [facility])     HOSPITAL COURSE:   Theresa [name]  is a 60  y.o. female with a known history of Chronic COPD, diabetes mellitus not on insulin, end-stage renal disease on hemodialysis which was incompletely done today because of the chest pain is sent over to the emergency department. Patient reports that she started having midsternal chest pain while she was getting hemodialysis. Here in the emergency department troponin is negative. Please review history and physical for details  Hospital course  #Near syncope  TIA/CVA ruled out Telemetry monitoring-no arrhythmia CT head and MRI brain are negative Carotid Dopplers with right and left ICA 50-69% stenosis close monitoring as an outpatient EEG is normal Recent echocardiogram in September 2017 has revealed EF 55-60% Check TSH 1.685 LDL 89 Currently she is on Crestor changing to atorvastatin 40 mg once daily PT, OT -outpatient PT is recommended speech therapy evaluation -no new recommendations Patient is on aspirin Patient passed bedside swallow evaluation  #Hypertensive urgency-blood pressure is much better now Resume home medications clonidine, Coreg and Lasix and titrate as needed Imdur 60 mg is added to the regimen. Will monitor and titrate as needed clonid is increased to 0.2 mg twice a day.  #Nausea and vomiting-resolved Supportive treatment provided Antiemetics given prn and continue  GI prophylaxis with OTC prolosec Patient denies any abdominal pain at this time  # atypical chest pain withhistory of coronary artery disease status post stent placement Seems  to be musculoskeletal as it is reproducible Telemetry  ruled out acute MI with negative troponins  Recent echocardiogram has revealed 55-60% ejection fraction in September 2017 Continue her home medication aspirin, Coreg, Crestor Appreciate Dr. Fletcher Anon Recommendations   #End stage renal disease on hemodialysis Monday, Wednesday, Friday Patient had hemodialysis today Follow up with nephrology  #Diabetes mellitus Diabetic  diet and sliding scale insulin   #Chronic COPD no exacerbation Continue home medications including inhalers  DISCHARGE CONDITIONS:   Stable  CONSULTS OBTAINED:  Treatment Team:  [name], MD Murlean Iba, MD Alexis Goodell, MD   PROCEDURES None  DRUG ALLERGIES:   Allergies  Allergen Reactions  . Hydrocodone Hives  . Metformin Diarrhea    DISCHARGE MEDICATIONS:   Current Discharge Medication List    START taking these medications   Details  acetaminophen (TYLENOL) 325 MG tablet Take 2 tablets (650 mg total) by mouth every 4 (four) hours as needed for headache or mild pain.    atorvastatin (LIPITOR) 40 MG tablet Take 1 tablet (40 mg total) by mouth daily at 6 PM. Qty: 30 tablet, Refills: 0    isosorbide mononitrate (IMDUR) 60 MG 24 hr tablet Take 1 tablet (60 mg total) by mouth daily. Qty: 30 tablet, Refills: 0    omeprazole (PRILOSEC OTC) 20 MG tablet Take 1 tablet (20 mg total) by mouth daily before breakfast. Qty: 30 tablet      CONTINUE these medications which have CHANGED   Details  cloNIDine (CATAPRES) 0.2 MG tablet Take 1 tablet (0.2 mg total) by mouth 2 (two) times daily. Qty: 60 tablet, Refills: 0      CONTINUE these medications which have NOT CHANGED   Details  aspirin EC 81 MG tablet Take 81 mg by mouth daily.    calcium acetate (PHOSLO) 667 MG capsule Take [phone] mg by mouth 3 (three) times daily with meals. Take 5 capsules if you have anything with cheese, otherwise, take 4 capsules if you don't have cheese.    carvedilol (COREG) 25 MG tablet Take 1 tablet (25 mg total) by mouth 2 (two) times daily with a meal. Qty: 60 tablet, Refills: 0    furosemide (LASIX) 80 MG tablet Take 80 mg by mouth daily. Only on Saturdays, Sundays, Tuesdays and Thursdays. (non-dialysis days)    losartan (COZAAR) 50 MG tablet Take 1 tablet (50 mg total) by mouth daily. Qty: 90 tablet, Refills: 3    multivitamin (RENA-VIT) TABS tablet Take 1 tablet  by mouth daily. Qty: 30 tablet, Refills: 5    pregabalin (LYRICA) 75 MG capsule Take 1 capsule (75 mg total) by mouth daily. Qty: 90 capsule, Refills: 1   Associated Diagnoses: Diabetic polyneuropathy associated with type 2 diabetes mellitus (HCC)    albuterol (PROVENTIL HFA;VENTOLIN HFA) 108 (90 BASE) MCG/ACT inhaler Inhale 2 puffs into the lungs every 6 (six) hours as needed for wheezing or shortness of breath.    albuterol (PROVENTIL) (2.5 MG/3ML) 0.083% nebulizer solution Take 2.5 mg by nebulization every 4 (four) hours as needed for wheezing or shortness of breath.    Melatonin 1 MG CAPS Take 1 capsule by mouth daily.    nitroGLYCERIN (NITROSTAT) 0.4 MG SL tablet Place 1 tablet (0.4 mg total) under the tongue every 5 (five) minutes as needed for chest pain. Qty: 25 tablet, Refills: 3      STOP taking these medications     CRESTOR 40 MG tablet          DISCHARGE INSTRUCTIONS:  Continue hemodialysis on Monday, Wednesday and Friday Outpatient follow-up with primary care physician in a week Outpatient follow-up with nephrology in 3-4 days Outpatient follow-up with Dr. Fletcher Anon in 2 weeks   DIET:  Renal diet  DISCHARGE CONDITION:  Stable  ACTIVITY:  Activity as tolerated,Outpatient physical therapy  OXYGEN:  Home Oxygen: No.   Oxygen Delivery: room air  DISCHARGE LOCATION:  home   If you experience worsening of your admission symptoms, develop shortness of breath, life threatening emergency, suicidal or homicidal thoughts you must seek medical attention immediately by calling 911 or calling your MD immediately  if symptoms less severe.  You Must read complete instructions/literature along with all the possible adverse reactions/side effects for all the Medicines you take and that have been prescribed to you. Take any new Medicines after you have completely understood and accpet all the possible adverse reactions/side effects.   Please note  You were cared for by  a hospitalist during your hospital stay. If you have any questions about your discharge medications or the care you received while you were in the hospital after you are discharged, you can call the unit and asked to speak with the hospitalist on call if the hospitalist that took care of you is not available. Once you are discharged, your primary care physician will handle any further medical issues. Please note that NO REFILLS for any discharge medications will be authorized once you are discharged, as it is imperative that you return to your primary care physician (or establish a relationship with a primary care physician if you do not have one) for your aftercare needs so that they can reassess your need for medications and monitor your lab values.     Today  Chief Complaint  Patient presents with  . Chest Pain   Patient denies any chest pain today. Not feeling dizzy. Reporting headache, patient takes Aleve at home whenever she gets headache. Otherwise no complaints and feels comfortable to go home  ROS:  CONSTITUTIONAL: Denies fevers, chills. Denies any fatigue, weakness.  EYES: Denies blurry vision, double vision, eye pain. EARS, NOSE, THROAT: Denies tinnitus, ear pain, hearing loss. RESPIRATORY: Denies cough, wheeze, shortness of breath.  CARDIOVASCULAR: Denies chest pain, palpitations, edema.  GASTROINTESTINAL: Denies nausea, vomiting, diarrhea, abdominal pain. Denies bright red blood per rectum. GENITOURINARY: Denies dysuria, hematuria. ENDOCRINE: Denies nocturia or thyroid problems. HEMATOLOGIC AND LYMPHATIC: Denies easy bruising or bleeding. SKIN: Denies rash or lesion. MUSCULOSKELETAL: Denies pain in neck, back, shoulder, knees, hips or arthritic symptoms.  NEUROLOGIC: Denies paralysis, paresthesias.  PSYCHIATRIC: Denies anxiety or depressive symptoms.   VITAL SIGNS:  Blood pressure (!) 113/47, pulse 64, temperature 98.4 F (36.9 C), temperature source Oral, resp. rate 16,  height 5' 7.5" (1.715 m), weight 73.9 kg (162 lb 14.4 oz), SpO2 95 %.  I/O:    Intake/Output Summary (Last 24 hours) at 05/24/16 1219 Last data filed at 05/24/16 1004  Gross per 24 hour  Intake                0 ml  Output             3000 ml  Net            -3000 ml    PHYSICAL EXAMINATION:  GENERAL:  60 y.o.-year-old patient lying in the bed with no acute distress.  EYES: Pupils equal, round, reactive to light and accommodation. No scleral icterus. Extraocular muscles intact.  HEENT: Head atraumatic, normocephalic. Oropharynx and nasopharynx clear.  NECK:  Supple, no jugular venous distention. No thyroid enlargement, no tenderness.  LUNGS: Normal breath sounds bilaterally, no wheezing, rales,rhonchi or crepitation. No use of accessory muscles of respiration.  CARDIOVASCULAR: S1, S2 normal. No murmurs, rubs, or gallops.  ABDOMEN: Soft, non-tender, non-distended. Bowel sounds present. No organomegaly or mass.  EXTREMITIES: No pedal edema, cyanosis, or clubbing.  NEUROLOGIC: Cranial nerves II through XII are intact. Muscle strength 5/5 in all extremities. Sensation intact. Gait not checked.  PSYCHIATRIC: The patient is alert and oriented x 3.  SKIN: No obvious rash, lesion, or ulcer.   DATA REVIEW:   CBC  Recent Labs Lab 05/21/16 1405  WBC 7.5  HGB 11.5*  HCT 33.6*  PLT 155    Chemistries   Recent Labs Lab 05/21/16 1405  NA 138  K 4.1  CL 98*  CO2 31  GLUCOSE 232*  BUN 27*  CREATININE 4.98*  CALCIUM 8.4*    Cardiac Enzymes  Recent Labs Lab 05/22/16 0147  TROPONINI 0.03*    Microbiology Results  Results for orders placed or performed during the hospital encounter of 05/21/16  MRSA PCR Screening     Status: None   Collection Time: 05/21/16  8:23 PM  Result Value Ref Range Status   MRSA by PCR NEGATIVE NEGATIVE Final    Comment:        The GeneXpert MRSA Assay (FDA approved for NASAL specimens only), is one component of a comprehensive MRSA  colonization surveillance program. It is not intended to diagnose MRSA infection nor to guide or monitor treatment for MRSA infections.     RADIOLOGY:  Dg Chest 2 View  Result Date: 05/21/2016 CLINICAL DATA:  60 year old female with a history of left-sided chest pain EXAM: CHEST  2 VIEW COMPARISON:  02/22/2016, 12/30/2015 FINDINGS: Cardiomediastinal silhouette unchanged. Calcifications of the aortic arch. Overall aeration is improved when compared to the most recent chest x-ray. No confluent airspace disease or pneumothorax. No evidence of pleural effusion. Coarsened appearance of the interstitium with low lung volumes. No displaced fracture. IMPRESSION: Improved aeration of the lungs compared to the most recent chest x-ray of September, with coarsened interstitium potentially representing chronic changes. No evidence of lobar pneumonia or pleural effusion. Aortic atherosclerosis. Signed, Dulcy Fanny. Earleen Newport, DO [facility] Electronically Signed   By: Corrie Mckusick D.O.   On: 05/21/2016 14:50   Ct Head Wo Contrast  Result Date: 05/22/2016 CLINICAL DATA:  Syncope yesterday with nausea during dialysis. EXAM: CT HEAD WITHOUT CONTRAST TECHNIQUE: Contiguous axial images were obtained from the base of the skull through the vertex without intravenous contrast. COMPARISON:  Multiple exams, including 11/23/2014 FINDINGS: Brain: The brainstem, cerebellum, cerebral peduncles, thalami, basal ganglia, basilar cisterns, and ventricular system appear within normal limits. No intracranial hemorrhage, mass lesion, or acute CVA. Chronic an probably physiologic calcification of the globus pallidus nuclei observed. Periventricular white matter and corona radiata hypodensities favor chronic ischemic microvascular white matter disease. Vascular: There is atherosclerotic calcification of the cavernous carotid arteries bilaterally. Skull: Unremarkable Sinuses/Orbits:  Unremarkable Other: No supplemental non-categorized findings. IMPRESSION: 1. No acute intracranial findings. 2. Periventricular white matter and corona radiata hypodensities favor chronic ischemic microvascular white matter disease. Electronically Signed   By: Van Clines M.D.   On: 05/22/2016 11:40   Mr Brain Wo Contrast  Result Date: 05/23/2016 CLINICAL DATA:  Syncope post dialysis EXAM: MRI HEAD WITHOUT CONTRAST TECHNIQUE: Multiplanar, multiecho pulse sequences of the brain and surrounding structures were obtained without intravenous  contrast. COMPARISON:  CT 05/22/2016.  MRI 11/24/2014 FINDINGS: Brain: Negative for acute infarct. Chronic microvascular ischemic change in the white matter and pons. Progressive changes in the pons since 2016. Negative for hemorrhage or mass. Ventricle size normal. No shift of the midline structures. Vascular: Normal arterial flow voids. Skull and upper cervical spine: Negative Sinuses/Orbits: Negative Other: None IMPRESSION: No acute intracranial abnormality. Chronic microvascular ischemic change in the white matter and pons. Progressive changes in the pons since the prior MRI of 11/24/2014 Electronically Signed   By: Franchot Gallo M.D.   On: 05/23/2016 10:05   US Carotid Bilateral  Result Date: 05/22/2016 CLINICAL DATA:  Syncope EXAM: BILATERAL CAROTID DUPLEX ULTRASOUND TECHNIQUE: Pearline Cables scale imaging, color Doppler and duplex ultrasound were performed of bilateral carotid and vertebral arteries in the neck. COMPARISON:  None. FINDINGS: Criteria: Quantification of carotid stenosis is based on velocity parameters that correlate the residual internal carotid diameter with NASCET-based stenosis levels, using the diameter of the distal internal carotid lumen as the denominator for stenosis measurement. The following velocity measurements were obtained: RIGHT ICA:  134 cm/sec CCA:  89 cm/sec SYSTOLIC ICA/CCA RATIO:  1.6 DIASTOLIC ICA/CCA RATIO:  2.2 ECA:  195 cm/sec  LEFT ICA:  155 cm/sec CCA:  89 cm/sec SYSTOLIC ICA/CCA RATIO:  0.7 DIASTOLIC ICA/CCA RATIO:  2.0 ECA:  231 cm/sec RIGHT CAROTID ARTERY: Moderate focal calcified plaque in the bulb. This causes luminal narrowing on color Doppler imaging. Doppler analysis demonstrates a low resistance pattern. RIGHT VERTEBRAL ARTERY:  Antegrade. LEFT CAROTID ARTERY: Moderate focal calcified plaque in the bulb. Low resistance internal carotid Doppler pattern. LEFT VERTEBRAL ARTERY:  Antegrade. IMPRESSION: 50-69% stenosis in the right and left internal carotid arteries. Vertebral arteries are antegrade in flow. Electronically Signed   By: Marybelle Killings M.D.   On: 05/22/2016 12:41    EKG:   Orders placed or performed during the hospital encounter of 05/21/16  . ED EKG within 10 minutes  . ED EKG within 10 minutes      Management plans discussed with the patient, family and they are in agreement.  CODE STATUS:     Code Status Orders        Start     Ordered   05/21/16 2002  Full code  Continuous     05/21/16 2002    Code Status History    Date Active Date Inactive Code Status Order ID Comments User Context   02/22/2016  7:02 PM 02/23/2016  2:49 PM Full Code [id number]  Dustin Flock, MD ED   05/07/2015  7:43 PM 05/08/2015  7:08 PM Full Code [id number]  Bettey Costa, MD Inpatient   05/02/2015  4:48 PM 05/04/2015  8:14 PM Full Code [id number]  Vaughan Basta, MD Inpatient   05/02/2015  3:31 PM 05/02/2015  4:48 PM Full Code [id number]  [name], MD Inpatient   11/23/2014 10:42 PM 11/25/2014  3:29 PM Full Code [id number]  Demetrios Loll, MD Inpatient      TOTAL TIME TAKING CARE OF THIS PATIENT: 45 minutes.   Note: This dictation was prepared with Dragon dictation along with smaller phrase technology. Any transcriptional errors that result from this process are unintentional.   @[facility] @  on 05/24/2016 at 12:19 PM  Between 7am to 6pm - Pager - [phone]  After 6pm go to [email or web address] - password [email or web address]  [facility]  Office  [phone]  CC: Primary care physician; Mable Paris, FNP

## 2016-05-24 NOTE — Discharge Instructions (Signed)
Continue hemodialysis on Monday, Wednesday and Friday Outpatient follow-up with primary care physician in a week Outpatient follow-up with nephrology in 3-4 days Outpatient follow-up with Dr. Fletcher Anon in 2 weeks

## 2016-05-24 NOTE — Care Management Note (Signed)
Case Management Note  Patient Details  Name: NATALIYA GRAIG MRN: [id number] Date of Birth: 1955/10/14  Subjective/Objective:    [facility]-PT is recommending  Outpatient - PT. Ms Vasconez will need to contact either her PCP or the [facility] to obtain a referral to an outpatient PT Clinic on Monday 05/26/16.               Action/Plan:   Expected Discharge Date:                  Expected Discharge Plan:     In-House Referral:     Discharge planning Services     Post Acute Care Choice:    Choice offered to:     DME Arranged:    DME Agency:     HH Arranged:    HH Agency:     Status of Service:     If discussed at [employer] of Stay Meetings, dates discussed:    Additional Comments:  Dinora Hemm A, RN 05/24/2016, 12:55 PM

## 2016-05-24 NOTE — Evaluation (Signed)
Physical Therapy Evaluation Patient Details Name: Theresa [name] MRN: [id number] DOB: [birth year] Today's Date: 05/24/2016   History of Present Illness  Theresa [name]  is a 60 y.o. female with a known history of Chronic COPD, diabetes mellitus not on insulin, end-stage renal disease on hemodialysis which was incompletely done today because of the chest pain is sent over to the emergency department. Troponin was negative and EKG was normal;   Clinical Impression  60 yo Female on dialysis, came to ED with increased chest pain. Patient was found to be hypotensive and experienced syncope episode during testing. Patient is now alert and oriented and reports feeling a lot better. She was independent in all self care ADLs prior to admittance. She lives in a single story home. Her granddaughter will stay with her, but is gone during the day. Patient reports that she was driving and independent in the community. Currently she is mod I for bed mobility and transfers. She ambulated 320 feet with supervision without AD. Patient does demonstrates BLE hip weakness and impaired balance with higher level tasks. She would benefit from additional skilled PT intervention to address balance deficits and improve mobility;     Follow Up Recommendations Outpatient PT    Equipment Recommendations  None recommended by PT    Recommendations for Other Services       Precautions / Restrictions Precautions Precautions: None Restrictions Weight Bearing Restrictions: No      Mobility  Bed Mobility Overal bed mobility: Modified Independent             General bed mobility comments: uses bed rails but able to sit up mod I  Transfers Overall transfer level: Modified independent               General transfer comment: able to transfer with good hand placement and safety; does use arm rests to push up on;   Ambulation/Gait Ambulation/Gait assistance: Supervision Ambulation Distance (Feet): 320  Feet Assistive device: None Gait Pattern/deviations: Step-through pattern;Decreased dorsiflexion - right;Decreased dorsiflexion - left;Wide base of support Gait velocity: 2.22 feet/sec   General Gait Details: demonstrates increased toe out; ambulates with slower gait speed;  Stairs            Wheelchair Mobility    Modified Rankin (Stroke Patients Only)       Balance Overall balance assessment: Needs assistance Sitting-balance support: No upper extremity supported;Feet supported Sitting balance-Leahy Scale: Good Sitting balance - Comments: demonstrates good trunk control;    Standing balance support: No upper extremity supported Standing balance-Leahy Scale: Fair Standing balance comment: standing with feet together: eyes open: slight sway, supervision; eyes closed: moderate sway but supervision; able to advance foot forward for modified tandem with increased sway;  Single Leg Stance - Right Leg: 2 Single Leg Stance - Left Leg: 2                         Pertinent Vitals/Pain Pain Assessment: No/denies pain    Home Living Family/patient expects to be discharged to:: Private residence Living Arrangements: Other relatives (granddaughter stays with her sometimes; at home alone during day; ) Available Help at Discharge: Family;Friend(s) Type of Home: House Home Access: Stairs to enter Entrance Stairs-Rails: None Entrance Stairs-Number of Steps: 1 Home Layout: One level Home Equipment: Cane - quad      Prior Function Level of Independence: Independent         Comments: Patient was driving, independent in all self care ADLs;  Hand Dominance   Dominant Hand: Right    Extremity/Trunk Assessment   Upper Extremity Assessment Upper Extremity Assessment: Overall WFL for tasks assessed    Lower Extremity Assessment Lower Extremity Assessment: RLE deficits/detail;LLE deficits/detail RLE Deficits / Details: intact light touch sesnsation; does have  neuropathy on BLE feet; hip: 3+/5, knee 4/5, ankle 4/5;  LLE Deficits / Details: intact light touch sesnsation; does have neuropathy on BLE feet; hip: 3+/5, knee 4/5, ankle 4/5;        Communication   Communication: No difficulties  Cognition Arousal/Alertness: Awake/alert Behavior During Therapy: WFL for tasks assessed/performed Overall Cognitive Status: Within Functional Limits for tasks assessed                      General Comments General comments (skin integrity, edema, etc.): grossly intact;     Exercises     Assessment/Plan    PT Assessment Patient needs continued PT services  PT Problem List Decreased strength;Decreased mobility;Decreased safety awareness;Decreased balance          PT Treatment Interventions Gait training;Functional mobility training;Neuromuscular re-education;Balance training;Therapeutic exercise;Therapeutic activities;Patient/family education    PT Goals (Current goals can be found in the Care Plan section)  Acute Rehab PT Goals Patient Stated Goal: "I want to go home and I want to have more energy" PT Goal Formulation: With patient Time For Goal Achievement: 06/07/16 Potential to Achieve Goals: Good    Frequency Min 2X/week   Barriers to discharge Decreased caregiver support does have granddaughter with her, but is home some alone during day;     Co-evaluation               End of Session Equipment Utilized During Treatment: Gait belt Activity Tolerance: Patient tolerated treatment well Patient left: in chair;with call bell/phone within reach;with chair alarm set Nurse Communication: Mobility status    Functional Assessment Tool Used: gait speed, clinical judgement Functional Limitation: Mobility: Walking and moving around Mobility: Walking and Moving Around Current Status (D6644): At least 1 percent but less than 20 percent impaired, limited or restricted Mobility: Walking and Moving Around Goal Status [phone]): At least  1 percent but less than 20 percent impaired, limited or restricted    Time: 0952-1015 PT Time Calculation (min) (ACUTE ONLY): 23 min   Charges:   PT Evaluation $PT Eval Low Complexity: 1 Procedure     PT G Codes:   PT G-Codes **NOT FOR INPATIENT CLASS** Functional Assessment Tool Used: gait speed, clinical judgement Functional Limitation: Mobility: Walking and moving around Mobility: Walking and Moving Around Current Status (Q5956): At least 1 percent but less than 20 percent impaired, limited or restricted Mobility: Walking and Moving Around Goal Status [phone]): At least 1 percent but less than 20 percent impaired, limited or restricted    Jasmin Winberry PT, DPT 05/24/2016, 11:33 AM

## 2016-05-24 NOTE — Progress Notes (Signed)
SLP Cancellation Note  Patient Details Name: ZOELLE MARKUS MRN: [id number] DOB: September 02, 1955   Cancelled treatment:       Reason Eval/Treat Not Completed: SLP screened, no needs identified, will sign off (chart reviewed; consulted NSG. Met w/ patient.). Pt denied any difficulty swallowing stating she was eating/drinking at meals "fine". NSG reported good toleration of meds w/ liquids. Pt conversed at conversational level w/out deficits noted. Discussion w/ pt. No further skilled ST Services indicated at this time. NSG to reconsult if any change in status while admitted. Pt agreed.   Orinda Kenner, MS, CCC-SLP Watson,Katherine 05/24/2016, 10:02 AM

## 2016-05-26 DIAGNOSIS — D631 Anemia in chronic kidney disease: Secondary | ICD-10-CM | POA: Diagnosis not present

## 2016-05-26 DIAGNOSIS — Z992 Dependence on renal dialysis: Secondary | ICD-10-CM | POA: Diagnosis not present

## 2016-05-26 DIAGNOSIS — N186 End stage renal disease: Secondary | ICD-10-CM | POA: Diagnosis not present

## 2016-05-26 DIAGNOSIS — N2581 Secondary hyperparathyroidism of renal origin: Secondary | ICD-10-CM | POA: Diagnosis not present

## 2016-05-26 DIAGNOSIS — D509 Iron deficiency anemia, unspecified: Secondary | ICD-10-CM | POA: Diagnosis not present

## 2016-05-26 NOTE — Care Management (Signed)
Faxed DC summary and confirmation of discharge date to Alda Lea with [facility]

## 2016-05-28 DIAGNOSIS — D509 Iron deficiency anemia, unspecified: Secondary | ICD-10-CM | POA: Diagnosis not present

## 2016-05-28 DIAGNOSIS — N2581 Secondary hyperparathyroidism of renal origin: Secondary | ICD-10-CM | POA: Diagnosis not present

## 2016-05-28 DIAGNOSIS — N186 End stage renal disease: Secondary | ICD-10-CM | POA: Diagnosis not present

## 2016-05-28 DIAGNOSIS — Z992 Dependence on renal dialysis: Secondary | ICD-10-CM | POA: Diagnosis not present

## 2016-05-28 DIAGNOSIS — D631 Anemia in chronic kidney disease: Secondary | ICD-10-CM | POA: Diagnosis not present

## 2016-05-29 ENCOUNTER — Telehealth: Payer: Self-pay | Admitting: Family

## 2016-05-30 DIAGNOSIS — N2581 Secondary hyperparathyroidism of renal origin: Secondary | ICD-10-CM | POA: Diagnosis not present

## 2016-05-30 DIAGNOSIS — D631 Anemia in chronic kidney disease: Secondary | ICD-10-CM | POA: Diagnosis not present

## 2016-05-30 DIAGNOSIS — Z992 Dependence on renal dialysis: Secondary | ICD-10-CM | POA: Diagnosis not present

## 2016-05-30 DIAGNOSIS — D509 Iron deficiency anemia, unspecified: Secondary | ICD-10-CM | POA: Diagnosis not present

## 2016-05-30 DIAGNOSIS — N186 End stage renal disease: Secondary | ICD-10-CM | POA: Diagnosis not present

## 2016-06-01 DIAGNOSIS — N186 End stage renal disease: Secondary | ICD-10-CM | POA: Diagnosis not present

## 2016-06-01 DIAGNOSIS — Z992 Dependence on renal dialysis: Secondary | ICD-10-CM | POA: Diagnosis not present

## 2016-06-01 DIAGNOSIS — D509 Iron deficiency anemia, unspecified: Secondary | ICD-10-CM | POA: Diagnosis not present

## 2016-06-01 DIAGNOSIS — N2581 Secondary hyperparathyroidism of renal origin: Secondary | ICD-10-CM | POA: Diagnosis not present

## 2016-06-01 DIAGNOSIS — D631 Anemia in chronic kidney disease: Secondary | ICD-10-CM | POA: Diagnosis not present

## 2016-06-03 NOTE — Telephone Encounter (Signed)
closed

## 2016-06-04 DIAGNOSIS — D631 Anemia in chronic kidney disease: Secondary | ICD-10-CM | POA: Diagnosis not present

## 2016-06-04 DIAGNOSIS — N2581 Secondary hyperparathyroidism of renal origin: Secondary | ICD-10-CM | POA: Diagnosis not present

## 2016-06-04 DIAGNOSIS — Z992 Dependence on renal dialysis: Secondary | ICD-10-CM | POA: Diagnosis not present

## 2016-06-04 DIAGNOSIS — N186 End stage renal disease: Secondary | ICD-10-CM | POA: Diagnosis not present

## 2016-06-04 DIAGNOSIS — D509 Iron deficiency anemia, unspecified: Secondary | ICD-10-CM | POA: Diagnosis not present

## 2016-06-06 DIAGNOSIS — N2581 Secondary hyperparathyroidism of renal origin: Secondary | ICD-10-CM | POA: Diagnosis not present

## 2016-06-06 DIAGNOSIS — D509 Iron deficiency anemia, unspecified: Secondary | ICD-10-CM | POA: Diagnosis not present

## 2016-06-06 DIAGNOSIS — N186 End stage renal disease: Secondary | ICD-10-CM | POA: Diagnosis not present

## 2016-06-06 DIAGNOSIS — Z992 Dependence on renal dialysis: Secondary | ICD-10-CM | POA: Diagnosis not present

## 2016-06-06 DIAGNOSIS — D631 Anemia in chronic kidney disease: Secondary | ICD-10-CM | POA: Diagnosis not present

## 2016-06-08 DIAGNOSIS — N186 End stage renal disease: Secondary | ICD-10-CM | POA: Diagnosis not present

## 2016-06-08 DIAGNOSIS — Z992 Dependence on renal dialysis: Secondary | ICD-10-CM | POA: Diagnosis not present

## 2016-06-09 DIAGNOSIS — N186 End stage renal disease: Secondary | ICD-10-CM | POA: Diagnosis not present

## 2016-06-09 DIAGNOSIS — D631 Anemia in chronic kidney disease: Secondary | ICD-10-CM | POA: Diagnosis not present

## 2016-06-09 DIAGNOSIS — N2581 Secondary hyperparathyroidism of renal origin: Secondary | ICD-10-CM | POA: Diagnosis not present

## 2016-06-09 DIAGNOSIS — Z992 Dependence on renal dialysis: Secondary | ICD-10-CM | POA: Diagnosis not present

## 2016-06-09 DIAGNOSIS — D509 Iron deficiency anemia, unspecified: Secondary | ICD-10-CM | POA: Diagnosis not present

## 2016-06-11 ENCOUNTER — Telehealth: Payer: Self-pay | Admitting: Gastroenterology

## 2016-06-11 DIAGNOSIS — Z992 Dependence on renal dialysis: Secondary | ICD-10-CM | POA: Diagnosis not present

## 2016-06-11 DIAGNOSIS — N2581 Secondary hyperparathyroidism of renal origin: Secondary | ICD-10-CM | POA: Diagnosis not present

## 2016-06-11 DIAGNOSIS — N186 End stage renal disease: Secondary | ICD-10-CM | POA: Diagnosis not present

## 2016-06-11 DIAGNOSIS — D631 Anemia in chronic kidney disease: Secondary | ICD-10-CM | POA: Diagnosis not present

## 2016-06-11 DIAGNOSIS — D509 Iron deficiency anemia, unspecified: Secondary | ICD-10-CM | POA: Diagnosis not present

## 2016-06-11 NOTE — Telephone Encounter (Signed)
Needs screening colonoscopy for transplant evaluation purposes. Referred  By [facility]. Please call them and let them know patients appointment date/time.

## 2016-06-13 DIAGNOSIS — D509 Iron deficiency anemia, unspecified: Secondary | ICD-10-CM | POA: Diagnosis not present

## 2016-06-13 DIAGNOSIS — D631 Anemia in chronic kidney disease: Secondary | ICD-10-CM | POA: Diagnosis not present

## 2016-06-13 DIAGNOSIS — Z992 Dependence on renal dialysis: Secondary | ICD-10-CM | POA: Diagnosis not present

## 2016-06-13 DIAGNOSIS — N186 End stage renal disease: Secondary | ICD-10-CM | POA: Diagnosis not present

## 2016-06-13 DIAGNOSIS — N2581 Secondary hyperparathyroidism of renal origin: Secondary | ICD-10-CM | POA: Diagnosis not present

## 2016-06-16 DIAGNOSIS — N2581 Secondary hyperparathyroidism of renal origin: Secondary | ICD-10-CM | POA: Diagnosis not present

## 2016-06-16 DIAGNOSIS — D631 Anemia in chronic kidney disease: Secondary | ICD-10-CM | POA: Diagnosis not present

## 2016-06-16 DIAGNOSIS — D509 Iron deficiency anemia, unspecified: Secondary | ICD-10-CM | POA: Diagnosis not present

## 2016-06-16 DIAGNOSIS — Z992 Dependence on renal dialysis: Secondary | ICD-10-CM | POA: Diagnosis not present

## 2016-06-16 DIAGNOSIS — N186 End stage renal disease: Secondary | ICD-10-CM | POA: Diagnosis not present

## 2016-06-18 DIAGNOSIS — N2581 Secondary hyperparathyroidism of renal origin: Secondary | ICD-10-CM | POA: Diagnosis not present

## 2016-06-18 DIAGNOSIS — D631 Anemia in chronic kidney disease: Secondary | ICD-10-CM | POA: Diagnosis not present

## 2016-06-18 DIAGNOSIS — N186 End stage renal disease: Secondary | ICD-10-CM | POA: Diagnosis not present

## 2016-06-18 DIAGNOSIS — Z992 Dependence on renal dialysis: Secondary | ICD-10-CM | POA: Diagnosis not present

## 2016-06-18 DIAGNOSIS — D509 Iron deficiency anemia, unspecified: Secondary | ICD-10-CM | POA: Diagnosis not present

## 2016-06-18 LAB — HM COLONOSCOPY

## 2016-06-19 ENCOUNTER — Encounter (INDEPENDENT_AMBULATORY_CARE_PROVIDER_SITE_OTHER): Payer: Self-pay | Admitting: Vascular Surgery

## 2016-06-19 ENCOUNTER — Ambulatory Visit (INDEPENDENT_AMBULATORY_CARE_PROVIDER_SITE_OTHER): Payer: Medicare Other | Admitting: Vascular Surgery

## 2016-06-19 VITALS — BP 143/62 | HR 74 | Resp 16 | Ht 67.5 in | Wt 170.0 lb

## 2016-06-19 DIAGNOSIS — T829XXS Unspecified complication of cardiac and vascular prosthetic device, implant and graft, sequela: Secondary | ICD-10-CM

## 2016-06-19 DIAGNOSIS — I10 Essential (primary) hypertension: Secondary | ICD-10-CM | POA: Diagnosis not present

## 2016-06-19 DIAGNOSIS — T829XXA Unspecified complication of cardiac and vascular prosthetic device, implant and graft, initial encounter: Secondary | ICD-10-CM | POA: Insufficient documentation

## 2016-06-19 DIAGNOSIS — I2510 Atherosclerotic heart disease of native coronary artery without angina pectoris: Secondary | ICD-10-CM | POA: Diagnosis not present

## 2016-06-19 DIAGNOSIS — Z992 Dependence on renal dialysis: Secondary | ICD-10-CM | POA: Diagnosis not present

## 2016-06-19 DIAGNOSIS — J449 Chronic obstructive pulmonary disease, unspecified: Secondary | ICD-10-CM | POA: Diagnosis not present

## 2016-06-19 DIAGNOSIS — N186 End stage renal disease: Secondary | ICD-10-CM | POA: Diagnosis not present

## 2016-06-19 NOTE — Progress Notes (Signed)
MRN : [id number]  Theresa [name] is a 61 y.o. (03-16-1956) female who presents with chief complaint of  Chief Complaint  Patient presents with  . Re-evaluation    6 month follow up  .  History of Present Illness: The patient returns to the office for followup of their dialysis access. The function of the access has been stable. The patient denies increased bleeding time or increased recirculation. Patient denies difficulty with cannulation. The patient denies hand pain or other symptoms consistent with steal phenomena. No significant arm swelling.  The patient denies redness or swelling at the access site. The patient denies fever or chills at home or while on dialysis.  The patient denies amaurosis fugax or recent TIA symptoms. There are no recent neurological changes noted. The patient denies claudication symptoms or rest pain symptoms. The patient denies history of DVT, PE or superficial thrombophlebitis. The patient denies recent episodes of angina or shortness of breath.  Previous duplex ultrasound of the AV access shows a patent access. No focal velocity changes compared to the previous study.  Current Meds  Medication Sig  . acetaminophen (TYLENOL) 325 MG tablet Take 2 tablets (650 mg total) by mouth every 4 (four) hours as needed for headache or mild pain.  Marland Kitchen albuterol (PROVENTIL HFA;VENTOLIN HFA) 108 (90 BASE) MCG/ACT inhaler Inhale 2 puffs into the lungs every 6 (six) hours as needed for wheezing or shortness of breath.  Marland Kitchen albuterol (PROVENTIL) (2.5 MG/3ML) 0.083% nebulizer solution Take 2.5 mg by nebulization every 4 (four) hours as needed for wheezing or shortness of breath.  Marland Kitchen aspirin EC 81 MG tablet Take 81 mg by mouth daily.  Marland Kitchen atorvastatin (LIPITOR) 40 MG tablet Take 1 tablet (40 mg total) by mouth daily at 6 PM.  . calcium acetate (PHOSLO) 667 MG capsule Take [phone] mg by mouth 3 (three) times daily with meals. Take 5 capsules if you have anything with cheese,  otherwise, take 4 capsules if you don't have cheese.  . carvedilol (COREG) 25 MG tablet Take 1 tablet (25 mg total) by mouth 2 (two) times daily with a meal.  . cloNIDine (CATAPRES) 0.2 MG tablet Take 1 tablet (0.2 mg total) by mouth 2 (two) times daily.  . furosemide (LASIX) 80 MG tablet Take 80 mg by mouth daily. Only on Saturdays, Sundays, Tuesdays and Thursdays. (non-dialysis days)  . isosorbide mononitrate (IMDUR) 60 MG 24 hr tablet Take 1 tablet (60 mg total) by mouth daily.  Marland Kitchen losartan (COZAAR) 50 MG tablet Take 1 tablet (50 mg total) by mouth daily.  . Melatonin 1 MG CAPS Take 1 capsule by mouth daily.  . multivitamin (RENA-VIT) TABS tablet Take 1 tablet by mouth daily.  . nitroGLYCERIN (NITROSTAT) 0.4 MG SL tablet Place 1 tablet (0.4 mg total) under the tongue every 5 (five) minutes as needed for chest pain.  Marland Kitchen omeprazole (PRILOSEC OTC) 20 MG tablet Take 1 tablet (20 mg total) by mouth daily before breakfast.  . pregabalin (LYRICA) 75 MG capsule Take 1 capsule (75 mg total) by mouth daily.   Current Facility-Administered Medications for the 06/19/16 encounter (Office Visit) with Katha Cabal, MD  Medication  . betamethasone acetate-betamethasone sodium phosphate (CELESTONE) injection 3 mg    Past Medical History:  Diagnosis Date  . Anemia of chronic disease   . Carotid arterial disease ([place])    a. 02/2013 U/S: 40-59% bilat ICA stenosis - *f/u 02/2014*  . Chronic constipation   . Chronic diastolic CHF (congestive  heart failure) ([place])    a. 10/2013 Echo [facility]): EF 55-60%, mod conc LVH, mod MR, mildly dil LA, mild Ao sclerosis w/o stenosis.  . Colon polyps   . COPD (chronic obstructive pulmonary disease) ([place])   . Coronary artery disease    a. 05/2013 NSTEMI/PCI: LM 20d, LAD min irregs, LCX small, nl, OM1 nl, RCA dom 74m (2.5x16 Promus DES), PDA1 80p.  . Diabetes mellitus   . Diabetic neuropathy ([place])   . Diabetic retinopathy ([place]) 05/28/2013   Hx bilat retinal detachment,  proliferative diab retinopathy and bilat vitreous hemorrhage   . Emphysema   . ESRD on hemodialysis ([place])    a. [facility] in [place], [place], on a TTS schedule.  She started dialysis in Feb 2014.  Etiology of renal failure not known, likely diabetes.  Has a left upper arm AV graft.  . History of bronchitis    Mar 2012  . History of pneumonia    June 2012  . History of tobacco abuse    a. Quit 2012.  Marland Kitchen Hyperlipidemia   . Hypertension   . Moderate mitral insufficiency    a. 10/2013 Echo: EF 55-60%, mod MR.  . Peripheral vascular disease ([place])   . Sickle cell trait [facility])     Past Surgical History:  Procedure Laterality Date  . ABDOMINAL HYSTERECTOMY     2000  . CARDIAC CATHETERIZATION    . CARDIAC CATHETERIZATION N/A 05/02/2015   Procedure: Left Heart Cath and Coronary Angiography;  Surgeon: [name], MD;  Location: [place] CV LAB;  Service: Cardiovascular;  Laterality: N/A;  . colonscopy    . CORONARY ANGIOPLASTY  05/28/2014   stent placement to the mid RCA  . DILATION AND CURETTAGE OF UTERUS     several in the early 80's  . ESOPHAGOGASTRODUODENOSCOPY     2012  . EYE SURGERY     bilateral laser 2012  . EYE SURGERY     right  . GAS INSERTION  09/30/2011   Procedure: INSERTION OF GAS;  Surgeon: [name], MD;  Location: [place];  Service: Ophthalmology;  Laterality: Right;  [id number]  . GAS/FLUID EXCHANGE  09/30/2011   Procedure: GAS/FLUID EXCHANGE;  Surgeon: [name], MD;  Location: [place];  Service: Ophthalmology;  Laterality: Right;  . LEFT HEART CATHETERIZATION WITH CORONARY ANGIOGRAM N/A 05/28/2013   Procedure: LEFT HEART CATHETERIZATION WITH CORONARY ANGIOGRAM;  Surgeon: [name], MD;  Location: [facility] CATH [facility];  Service: Cardiovascular;  Laterality: N/A;  . PARS PLANA VITRECTOMY  04/22/2011   Procedure: PARS PLANA VITRECTOMY WITH 25 GAUGE;  Surgeon: [name], MD;  Location: [place];  Service: Ophthalmology;  Laterality: Left;  membrane peel,  endolaser, gas fluid exchange, silicone oil, repair of complex traction retinal detachment  . PARS PLANA VITRECTOMY  09/30/2011   Procedure: PARS PLANA VITRECTOMY WITH 25 GAUGE;  Surgeon: [name], MD;  Location: [place];  Service: Ophthalmology;  Laterality: Right;  Endolaser; Repair of Complex Traction Retinal Detachment  . PARS PLANA VITRECTOMY  02/24/2012   Procedure: PARS PLANA VITRECTOMY WITH 25 GAUGE;  Surgeon: [name], MD;  Location: [place];  Service: Ophthalmology;  Laterality: Left;  . PTCA    . SILICON OIL REMOVAL  1/85/6314   Procedure: SILICON OIL REMOVAL;  Surgeon: [name], MD;  Location: [place];  Service: Ophthalmology;  Laterality: Left;  . THROMBECTOMY / ARTERIOVENOUS GRAFT REVISION    . TUBAL LIGATION  69    Social History Social History  Substance Use Topics  . Smoking status: Former Smoker    Packs/day: 1.00    Years: 25.00    Quit date: 06/09/2010  . Smokeless tobacco: Never Used  . Alcohol use No    Family History Family History  Problem Relation Age of Onset  . Stroke Mother   . Heart attack Mother   . Heart disease Mother   . Colon cancer Father   . Colon cancer Sister   . Heart attack Brother   . Stroke Brother   . Diabetes Brother   . Breast cancer Sister   . Diabetes Brother   . Diabetes Daughter   . Anesthesia problems Neg Hx   . Hypotension Neg Hx   . Malignant hyperthermia Neg Hx   . Pseudochol deficiency Neg Hx   No family history of bleeding/clotting disorders, porphyria or autoimmune disease   Allergies  Allergen Reactions  . Hydrocodone Hives  . Metformin Diarrhea     REVIEW OF SYSTEMS (Negative unless checked)  Constitutional: [] Weight loss  [] Fever  [] Chills Cardiac: [] Chest pain   [] Chest pressure   [] Palpitations   [] Shortness of breath when laying flat   [] Shortness of breath with exertion. Vascular:  [] Pain in legs with walking   [] Pain in legs at rest  [] History of DVT   [] Phlebitis   [] Swelling in  legs   [] Varicose veins   [] Non-healing ulcers Pulmonary:   [] Uses home oxygen   [] Productive cough   [] Hemoptysis   [] Wheeze  [] COPD   [] Asthma Neurologic:  [x] Dizziness   [] Seizures   [] History of stroke   [] History of TIA  [] Aphasia   [x] Sleepping Problems   [] Weakness or numbness in arm   [] Weakness or numbness in leg Musculoskeletal:   [] Joint swelling   [] Joint pain   [] Low back pain Hematologic:  [] Easy bruising  [] Easy bleeding   [] Hypercoagulable state   [] Anemic Gastrointestinal:  [] Diarrhea   [] Vomiting  [] Gastroesophageal reflux/heartburn   [] Difficulty swallowing. Genitourinary:  [x] Chronic kidney disease   [] Difficult urination  [] Frequent urination   [] Blood in urine Skin:  [] Rashes   [] Ulcers  Psychological:  [] History of anxiety   []  History of major depression.  Physical Examination  Vitals:   06/19/16 1307  BP: (!) 143/62  Pulse: 74  Resp: 16  Weight: 170 lb (77.1 kg)  Height: 5' 7.5" (1.715 m)   Body mass index is 26.23 kg/m. Gen: WD/WN, NAD Head: [place]/AT, No temporalis wasting.  Ear/Nose/Throat: Hearing grossly intact, nares w/o erythema or drainage, poor dentition Eyes: PER, EOMI, sclera nonicteric.  Neck: Supple, no masses.  No bruit or JVD.  Pulmonary:  Good air movement, clear to auscultation bilaterally, no use of accessory muscles.  Cardiac: RRR, normal S1, S2, no Murmurs. Vascular: left forearm AV graft with excellent thrill and bruit Vessel Right Left  Radial Palpable Palpable  Ulnar Palpable Palpable  Brachial Palpable Palpable  Carotid Palpable Palpable  Gastrointestinal: soft, non-distended. No guarding/no peritoneal signs.  Musculoskeletal: M/S 5/5 throughout.  No deformity or atrophy.  Neurologic: CN 2-12 intact. Pain and light touch intact in extremities.  Symmetrical.  Speech is fluent. Motor exam as listed above. Psychiatric: Judgment intact, Mood & affect appropriate for pt's clinical situation. Dermatologic: No rashes or ulcers noted.  No  changes consistent with cellulitis. Lymph : No Cervical lymphadenopathy, no lichenification or skin changes of chronic lymphedema.  CBC Lab Results  Component Value Date   WBC 7.5 05/21/2016  HGB 11.5 (L) 05/21/2016   HCT 33.6 (L) 05/21/2016   MCV 99.6 05/21/2016   PLT 155 05/21/2016    BMET    Component Value Date/Time   NA 138 05/21/2016 1405   NA 143 07/27/2015 1737   NA 137 05/14/2014 0741   K 4.1 05/21/2016 1405   K 3.3 (L) 05/14/2014 1500   CL 98 (L) 05/21/2016 1405   CL 98 05/14/2014 0741   CO2 31 05/21/2016 1405   CO2 35 (H) 05/14/2014 0741   GLUCOSE 232 (H) 05/21/2016 1405   GLUCOSE 96 05/14/2014 0741   BUN 27 (H) 05/21/2016 1405   BUN 25 (H) 05/14/2014 0741   CREATININE 4.98 (H) 05/21/2016 1405   CREATININE 5.83 (H) 05/14/2014 0741   CALCIUM 8.4 (L) 05/21/2016 1405   CALCIUM 8.9 05/14/2014 0741   GFRNONAA 9 (L) 05/21/2016 1405   GFRNONAA 8 (L) 05/14/2014 0741   GFRNONAA 4 (L) 02/15/2014 0837   GFRAA 10 (L) 05/21/2016 1405   GFRAA 10 (L) 05/14/2014 0741   GFRAA 5 (L) 02/15/2014 0837   CrCl cannot be calculated (Patient's most recent lab result is older than the maximum 21 days allowed.).  COAG Lab Results  Component Value Date   INR 1.17 05/02/2015   INR 1.0 10/10/2013   INR 1.0 05/12/2013    Radiology Dg Chest 2 View  Result Date: 05/21/2016 CLINICAL DATA:  61 year old female with a history of left-sided chest pain EXAM: CHEST  2 VIEW COMPARISON:  02/22/2016, 12/30/2015 FINDINGS: Cardiomediastinal silhouette unchanged. Calcifications of the aortic arch. Overall aeration is improved when compared to the most recent chest x-ray. No confluent airspace disease or pneumothorax. No evidence of pleural effusion. Coarsened appearance of the interstitium with low lung volumes. No displaced fracture. IMPRESSION: Improved aeration of the lungs compared to the most recent chest x-ray of September, with coarsened interstitium potentially representing chronic  changes. No evidence of lobar pneumonia or pleural effusion. Aortic atherosclerosis. Signed, Dulcy Fanny. Earleen Newport, DO [facility] Radiology Electronically Signed   By: Corrie Mckusick D.O.   On: 05/21/2016 14:50   Ct Head Wo Contrast  Result Date: 05/22/2016 CLINICAL DATA:  Syncope yesterday with nausea during dialysis. EXAM: CT HEAD WITHOUT CONTRAST TECHNIQUE: Contiguous axial images were obtained from the base of the skull through the vertex without intravenous contrast. COMPARISON:  Multiple exams, including 11/23/2014 FINDINGS: Brain: The brainstem, cerebellum, cerebral peduncles, thalami, basal ganglia, basilar cisterns, and ventricular system appear within normal limits. No intracranial hemorrhage, mass lesion, or acute CVA. Chronic an probably physiologic calcification of the globus pallidus nuclei observed. Periventricular white matter and corona radiata hypodensities favor chronic ischemic microvascular white matter disease. Vascular: There is atherosclerotic calcification of the cavernous carotid arteries bilaterally. Skull: Unremarkable Sinuses/Orbits: Unremarkable Other: No supplemental non-categorized findings. IMPRESSION: 1. No acute intracranial findings. 2. Periventricular white matter and corona radiata hypodensities favor chronic ischemic microvascular white matter disease. Electronically Signed   By: Van Clines M.D.   On: 05/22/2016 11:40   Mr Brain Wo Contrast  Result Date: 05/23/2016 CLINICAL DATA:  Syncope post dialysis EXAM: MRI HEAD WITHOUT CONTRAST TECHNIQUE: Multiplanar, multiecho pulse sequences of the brain and surrounding structures were obtained without intravenous contrast. COMPARISON:  CT 05/22/2016.  MRI 11/24/2014 FINDINGS: Brain: Negative for acute infarct. Chronic microvascular ischemic change in the white matter and pons. Progressive changes in the pons since 2016. Negative for hemorrhage or mass. Ventricle size normal.  No shift of the midline structures. Vascular: Normal  arterial flow voids. Skull and upper cervical spine: Negative Sinuses/Orbits: Negative Other: None IMPRESSION: No acute intracranial abnormality. Chronic microvascular ischemic change in the white matter and pons. Progressive changes in the pons since the prior MRI of 11/24/2014 Electronically Signed   By: Franchot Gallo M.D.   On: 05/23/2016 10:05   US Carotid Bilateral  Result Date: 05/22/2016 CLINICAL DATA:  Syncope EXAM: BILATERAL CAROTID DUPLEX ULTRASOUND TECHNIQUE: Pearline Cables scale imaging, color Doppler and duplex ultrasound were performed of bilateral carotid and vertebral arteries in the neck. COMPARISON:  None. FINDINGS: Criteria: Quantification of carotid stenosis is based on velocity parameters that correlate the residual internal carotid diameter with NASCET-based stenosis levels, using the diameter of the distal internal carotid lumen as the denominator for stenosis measurement. The following velocity measurements were obtained: RIGHT ICA:  134 cm/sec CCA:  89 cm/sec SYSTOLIC ICA/CCA RATIO:  1.6 DIASTOLIC ICA/CCA RATIO:  2.2 ECA:  195 cm/sec LEFT ICA:  155 cm/sec CCA:  89 cm/sec SYSTOLIC ICA/CCA RATIO:  0.7 DIASTOLIC ICA/CCA RATIO:  2.0 ECA:  231 cm/sec RIGHT CAROTID ARTERY: Moderate focal calcified plaque in the bulb. This causes luminal narrowing on color Doppler imaging. Doppler analysis demonstrates a low resistance pattern. RIGHT VERTEBRAL ARTERY:  Antegrade. LEFT CAROTID ARTERY: Moderate focal calcified plaque in the bulb. Low resistance internal carotid Doppler pattern. LEFT VERTEBRAL ARTERY:  Antegrade. IMPRESSION: 50-69% stenosis in the right and left internal carotid arteries. Vertebral arteries are antegrade in flow. Electronically Signed   By: Marybelle Killings M.D.   On: 05/22/2016 12:41    Assessment/Plan 1. ESRD on hemodialysis [facility]) Recommend:  The patient is doing well and currently has adequate dialysis access. The patient's  dialysis center is not reporting any access issues. Flow pattern is stable when compared to the prior ultrasound.  The patient should have a duplex ultrasound of the dialysis access in 6 months. The patient will follow-up with me in the office after each ultrasound   2. Complication of vascular access for dialysis, sequela No further troubles with her access at this time  3. Essential (primary) hypertension Continue antihypertensive medications as already ordered, these medications have been reviewed and there are no changes at this time.  4. Coronary artery disease involving native coronary artery of native heart without angina pectoris Continue cardiac and antihypertensive medications as already ordered and reviewed, no changes at this time.  Continue statin as ordered and reviewed, no changes at this time  Nitrates PRN for chest pain  5. Chronic obstructive pulmonary disease, unspecified COPD type ([place]) Continue pulmonary medications and aerosols as already ordered, these medications have been reviewed and there are no changes at this time.    Hortencia Pilar, MD  06/19/2016 1:15 PM

## 2016-06-20 DIAGNOSIS — D509 Iron deficiency anemia, unspecified: Secondary | ICD-10-CM | POA: Diagnosis not present

## 2016-06-20 DIAGNOSIS — N2581 Secondary hyperparathyroidism of renal origin: Secondary | ICD-10-CM | POA: Diagnosis not present

## 2016-06-20 DIAGNOSIS — N186 End stage renal disease: Secondary | ICD-10-CM | POA: Diagnosis not present

## 2016-06-20 DIAGNOSIS — Z992 Dependence on renal dialysis: Secondary | ICD-10-CM | POA: Diagnosis not present

## 2016-06-20 DIAGNOSIS — D631 Anemia in chronic kidney disease: Secondary | ICD-10-CM | POA: Diagnosis not present

## 2016-06-23 DIAGNOSIS — N2581 Secondary hyperparathyroidism of renal origin: Secondary | ICD-10-CM | POA: Diagnosis not present

## 2016-06-23 DIAGNOSIS — D631 Anemia in chronic kidney disease: Secondary | ICD-10-CM | POA: Diagnosis not present

## 2016-06-23 DIAGNOSIS — Z992 Dependence on renal dialysis: Secondary | ICD-10-CM | POA: Diagnosis not present

## 2016-06-23 DIAGNOSIS — D509 Iron deficiency anemia, unspecified: Secondary | ICD-10-CM | POA: Diagnosis not present

## 2016-06-23 DIAGNOSIS — N186 End stage renal disease: Secondary | ICD-10-CM | POA: Diagnosis not present

## 2016-06-24 ENCOUNTER — Telehealth: Payer: Self-pay | Admitting: Family

## 2016-06-24 ENCOUNTER — Ambulatory Visit: Payer: Medicare Other | Admitting: Family

## 2016-06-24 NOTE — Telephone Encounter (Signed)
Patient did not show for her [facility] appointment on 06/24/16. Will attempt to reschedule.

## 2016-06-25 DIAGNOSIS — D509 Iron deficiency anemia, unspecified: Secondary | ICD-10-CM | POA: Diagnosis not present

## 2016-06-25 DIAGNOSIS — N186 End stage renal disease: Secondary | ICD-10-CM | POA: Diagnosis not present

## 2016-06-25 DIAGNOSIS — D631 Anemia in chronic kidney disease: Secondary | ICD-10-CM | POA: Diagnosis not present

## 2016-06-25 DIAGNOSIS — Z992 Dependence on renal dialysis: Secondary | ICD-10-CM | POA: Diagnosis not present

## 2016-06-25 DIAGNOSIS — N2581 Secondary hyperparathyroidism of renal origin: Secondary | ICD-10-CM | POA: Diagnosis not present

## 2016-06-27 DIAGNOSIS — D631 Anemia in chronic kidney disease: Secondary | ICD-10-CM | POA: Diagnosis not present

## 2016-06-27 DIAGNOSIS — N2581 Secondary hyperparathyroidism of renal origin: Secondary | ICD-10-CM | POA: Diagnosis not present

## 2016-06-27 DIAGNOSIS — N186 End stage renal disease: Secondary | ICD-10-CM | POA: Diagnosis not present

## 2016-06-27 DIAGNOSIS — D509 Iron deficiency anemia, unspecified: Secondary | ICD-10-CM | POA: Diagnosis not present

## 2016-06-27 DIAGNOSIS — Z992 Dependence on renal dialysis: Secondary | ICD-10-CM | POA: Diagnosis not present

## 2016-06-30 ENCOUNTER — Telehealth: Payer: Self-pay

## 2016-06-30 ENCOUNTER — Other Ambulatory Visit: Payer: Self-pay

## 2016-06-30 DIAGNOSIS — N186 End stage renal disease: Secondary | ICD-10-CM | POA: Diagnosis not present

## 2016-06-30 DIAGNOSIS — Z992 Dependence on renal dialysis: Secondary | ICD-10-CM | POA: Diagnosis not present

## 2016-06-30 DIAGNOSIS — D631 Anemia in chronic kidney disease: Secondary | ICD-10-CM | POA: Diagnosis not present

## 2016-06-30 DIAGNOSIS — N2581 Secondary hyperparathyroidism of renal origin: Secondary | ICD-10-CM | POA: Diagnosis not present

## 2016-06-30 DIAGNOSIS — D509 Iron deficiency anemia, unspecified: Secondary | ICD-10-CM | POA: Diagnosis not present

## 2016-06-30 NOTE — Telephone Encounter (Signed)
Screening colonoscopy at [facility] on 07/08/16 with [place].

## 2016-06-30 NOTE — Telephone Encounter (Signed)
Pt scheduled for a colonoscopy at [facility] with [place] on 07/08/16.

## 2016-06-30 NOTE — Telephone Encounter (Signed)
Gastroenterology Pre-Procedure Review  Request Date:  Requesting Physician: Dr.   PATIENT REVIEW QUESTIONS: The patient responded to the following health history questions as indicated:    1. Are you having any GI issues? no 2. Do you have a personal history of Polyps? yes (1 polyp) 3. Do you have a family history of Colon Cancer or Polyps? yes (father and sister colon cancer) 4. Diabetes Mellitus? no 5. Joint replacements in the past 12 months?no 6. Major health problems in the past 3 months?yes (Dialysis issues) 7. Any artificial heart valves, MVP, or defibrillator?yes (Stent - Dec 2014)    MEDICATIONS & ALLERGIES:    Patient reports the following regarding taking any anticoagulation/antiplatelet therapy:   Plavix, Coumadin, Eliquis, Xarelto, Lovenox, Pradaxa, Brilinta, or Effient? no Aspirin? yes (ASA 81mg )  Patient confirms/reports the following medications:  Current Outpatient Prescriptions  Medication Sig Dispense Refill  . acetaminophen (TYLENOL) 325 MG tablet Take 2 tablets (650 mg total) by mouth every 4 (four) hours as needed for headache or mild pain.    Marland Kitchen albuterol (PROVENTIL HFA;VENTOLIN HFA) 108 (90 BASE) MCG/ACT inhaler Inhale 2 puffs into the lungs every 6 (six) hours as needed for wheezing or shortness of breath.    Marland Kitchen albuterol (PROVENTIL) (2.5 MG/3ML) 0.083% nebulizer solution Take 2.5 mg by nebulization every 4 (four) hours as needed for wheezing or shortness of breath.    Marland Kitchen aspirin EC 81 MG tablet Take 81 mg by mouth daily.    Marland Kitchen atorvastatin (LIPITOR) 40 MG tablet Take 1 tablet (40 mg total) by mouth daily at 6 PM. 30 tablet 0  . calcium acetate (PHOSLO) 667 MG capsule Take [phone] mg by mouth 3 (three) times daily with meals. Take 5 capsules if you have anything with cheese, otherwise, take 4 capsules if you don't have cheese.    . carvedilol (COREG) 25 MG tablet Take 1 tablet (25 mg total) by mouth 2 (two) times daily with a meal. 60 tablet 0  . cloNIDine  (CATAPRES) 0.2 MG tablet Take 1 tablet (0.2 mg total) by mouth 2 (two) times daily. 60 tablet 0  . furosemide (LASIX) 80 MG tablet Take 80 mg by mouth daily. Only on Saturdays, Sundays, Tuesdays and Thursdays. (non-dialysis days)    . isosorbide mononitrate (IMDUR) 60 MG 24 hr tablet Take 1 tablet (60 mg total) by mouth daily. 30 tablet 0  . losartan (COZAAR) 50 MG tablet Take 1 tablet (50 mg total) by mouth daily. 90 tablet 3  . Melatonin 1 MG CAPS Take 1 capsule by mouth daily.    . multivitamin (RENA-VIT) TABS tablet Take 1 tablet by mouth daily. 30 tablet 5  . nitroGLYCERIN (NITROSTAT) 0.4 MG SL tablet Place 1 tablet (0.4 mg total) under the tongue every 5 (five) minutes as needed for chest pain. 25 tablet 3  . omeprazole (PRILOSEC OTC) 20 MG tablet Take 1 tablet (20 mg total) by mouth daily before breakfast. 30 tablet   . pregabalin (LYRICA) 75 MG capsule Take 1 capsule (75 mg total) by mouth daily. 90 capsule 1   Current Facility-Administered Medications  Medication Dose Route Frequency Provider Last Rate Last Dose  . betamethasone acetate-betamethasone sodium phosphate (CELESTONE) injection 3 mg  3 mg Intramuscular Once Edrick Kins, DPM        Patient confirms/reports the following allergies:  Allergies  Allergen Reactions  . Hydrocodone Hives  . Metformin Diarrhea    No orders of the defined types were placed in this encounter.  AUTHORIZATION INFORMATION Primary Insurance: 1D#: Group #:  Secondary Insurance: 1D#: Group #:  SCHEDULE INFORMATION: Date: 07/08/16 Time: Location: [place]

## 2016-07-01 ENCOUNTER — Ambulatory Visit (INDEPENDENT_AMBULATORY_CARE_PROVIDER_SITE_OTHER): Payer: Medicare Other | Admitting: Podiatry

## 2016-07-01 DIAGNOSIS — B351 Tinea unguium: Secondary | ICD-10-CM

## 2016-07-01 DIAGNOSIS — E0843 Diabetes mellitus due to underlying condition with diabetic autonomic (poly)neuropathy: Secondary | ICD-10-CM

## 2016-07-01 DIAGNOSIS — M79609 Pain in unspecified limb: Secondary | ICD-10-CM | POA: Diagnosis not present

## 2016-07-01 DIAGNOSIS — L608 Other nail disorders: Secondary | ICD-10-CM

## 2016-07-01 DIAGNOSIS — L603 Nail dystrophy: Secondary | ICD-10-CM

## 2016-07-01 NOTE — Progress Notes (Signed)
   SUBJECTIVE Patient with a history of diabetes mellitus presents to office today complaining of elongated, thickened nails. Pain while ambulating in shoes. Patient is unable to trim their own nails.   OBJECTIVE General Patient is awake, alert, and oriented x 3 and in no acute distress. Derm Skin is dry and supple bilateral. Negative open lesions or macerations. Remaining integument unremarkable. Nails are tender, long, thickened and dystrophic with subungual debris, consistent with onychomycosis, 1-5 bilateral. No signs of infection noted. Vasc  DP and PT pedal pulses palpable bilaterally. Temperature gradient within normal limits.  Neuro Epicritic and protective threshold sensation diminished bilaterally.  Musculoskeletal Exam No symptomatic pedal deformities noted bilateral. Muscular strength within normal limits.  ASSESSMENT 1. Diabetes Mellitus w/ peripheral neuropathy 2. Onychomycosis of nail due to dermatophyte bilateral 3. Pain in foot bilateral  PLAN OF CARE 1. Patient evaluated today. 2. Instructed to maintain good pedal hygiene and foot care. Stressed importance of controlling blood sugar.  3. Mechanical debridement of nails 1-5 bilaterally performed using a nail nipper. Filed with dremel without incident.  4. Recommend AmLactin cream for dry eye she skin 5. Return to clinic in 3 mos.     Edrick Kins, DPM [facility]  Dr. Edrick Kins, [place] [address]                                        [place], [place] [address]                Office [phone]  Fax [phone]

## 2016-07-02 DIAGNOSIS — Z992 Dependence on renal dialysis: Secondary | ICD-10-CM | POA: Diagnosis not present

## 2016-07-02 DIAGNOSIS — D509 Iron deficiency anemia, unspecified: Secondary | ICD-10-CM | POA: Diagnosis not present

## 2016-07-02 DIAGNOSIS — D631 Anemia in chronic kidney disease: Secondary | ICD-10-CM | POA: Diagnosis not present

## 2016-07-02 DIAGNOSIS — N186 End stage renal disease: Secondary | ICD-10-CM | POA: Diagnosis not present

## 2016-07-02 DIAGNOSIS — N2581 Secondary hyperparathyroidism of renal origin: Secondary | ICD-10-CM | POA: Diagnosis not present

## 2016-07-03 ENCOUNTER — Encounter: Payer: Self-pay | Admitting: Family

## 2016-07-03 ENCOUNTER — Ambulatory Visit: Payer: Medicare Other | Attending: Family | Admitting: Family

## 2016-07-03 VITALS — BP 208/67 | HR 75 | Resp 18 | Ht 67.0 in | Wt 170.1 lb

## 2016-07-03 DIAGNOSIS — I2510 Atherosclerotic heart disease of native coronary artery without angina pectoris: Secondary | ICD-10-CM | POA: Insufficient documentation

## 2016-07-03 DIAGNOSIS — J449 Chronic obstructive pulmonary disease, unspecified: Secondary | ICD-10-CM | POA: Insufficient documentation

## 2016-07-03 DIAGNOSIS — E1140 Type 2 diabetes mellitus with diabetic neuropathy, unspecified: Secondary | ICD-10-CM | POA: Diagnosis not present

## 2016-07-03 DIAGNOSIS — Z803 Family history of malignant neoplasm of breast: Secondary | ICD-10-CM | POA: Insufficient documentation

## 2016-07-03 DIAGNOSIS — Z86010 Personal history of colonic polyps: Secondary | ICD-10-CM | POA: Diagnosis not present

## 2016-07-03 DIAGNOSIS — D573 Sickle-cell trait: Secondary | ICD-10-CM | POA: Diagnosis not present

## 2016-07-03 DIAGNOSIS — Z87891 Personal history of nicotine dependence: Secondary | ICD-10-CM | POA: Diagnosis not present

## 2016-07-03 DIAGNOSIS — Z7982 Long term (current) use of aspirin: Secondary | ICD-10-CM | POA: Diagnosis not present

## 2016-07-03 DIAGNOSIS — E785 Hyperlipidemia, unspecified: Secondary | ICD-10-CM | POA: Diagnosis not present

## 2016-07-03 DIAGNOSIS — Z992 Dependence on renal dialysis: Secondary | ICD-10-CM | POA: Insufficient documentation

## 2016-07-03 DIAGNOSIS — I132 Hypertensive heart and chronic kidney disease with heart failure and with stage 5 chronic kidney disease, or end stage renal disease: Secondary | ICD-10-CM | POA: Diagnosis not present

## 2016-07-03 DIAGNOSIS — Z90710 Acquired absence of both cervix and uterus: Secondary | ICD-10-CM | POA: Diagnosis not present

## 2016-07-03 DIAGNOSIS — I252 Old myocardial infarction: Secondary | ICD-10-CM | POA: Insufficient documentation

## 2016-07-03 DIAGNOSIS — I10 Essential (primary) hypertension: Secondary | ICD-10-CM

## 2016-07-03 DIAGNOSIS — E1122 Type 2 diabetes mellitus with diabetic chronic kidney disease: Secondary | ICD-10-CM | POA: Insufficient documentation

## 2016-07-03 DIAGNOSIS — Z955 Presence of coronary angioplasty implant and graft: Secondary | ICD-10-CM | POA: Insufficient documentation

## 2016-07-03 DIAGNOSIS — E7849 Other hyperlipidemia: Secondary | ICD-10-CM

## 2016-07-03 DIAGNOSIS — Z833 Family history of diabetes mellitus: Secondary | ICD-10-CM | POA: Insufficient documentation

## 2016-07-03 DIAGNOSIS — Z823 Family history of stroke: Secondary | ICD-10-CM | POA: Diagnosis not present

## 2016-07-03 DIAGNOSIS — Z885 Allergy status to narcotic agent status: Secondary | ICD-10-CM | POA: Insufficient documentation

## 2016-07-03 DIAGNOSIS — Z888 Allergy status to other drugs, medicaments and biological substances status: Secondary | ICD-10-CM | POA: Diagnosis not present

## 2016-07-03 DIAGNOSIS — Z8249 Family history of ischemic heart disease and other diseases of the circulatory system: Secondary | ICD-10-CM | POA: Insufficient documentation

## 2016-07-03 DIAGNOSIS — E11319 Type 2 diabetes mellitus with unspecified diabetic retinopathy without macular edema: Secondary | ICD-10-CM | POA: Insufficient documentation

## 2016-07-03 DIAGNOSIS — E1151 Type 2 diabetes mellitus with diabetic peripheral angiopathy without gangrene: Secondary | ICD-10-CM | POA: Insufficient documentation

## 2016-07-03 DIAGNOSIS — D638 Anemia in other chronic diseases classified elsewhere: Secondary | ICD-10-CM | POA: Diagnosis not present

## 2016-07-03 DIAGNOSIS — I6523 Occlusion and stenosis of bilateral carotid arteries: Secondary | ICD-10-CM | POA: Insufficient documentation

## 2016-07-03 DIAGNOSIS — I5032 Chronic diastolic (congestive) heart failure: Secondary | ICD-10-CM | POA: Diagnosis not present

## 2016-07-03 DIAGNOSIS — N186 End stage renal disease: Secondary | ICD-10-CM | POA: Diagnosis not present

## 2016-07-03 DIAGNOSIS — Z800 Family history of malignant neoplasm of digestive organs: Secondary | ICD-10-CM | POA: Insufficient documentation

## 2016-07-03 MED ORDER — ATORVASTATIN CALCIUM 40 MG PO TABS: 40.0000 mg | ORAL_TABLET | Freq: Every day | ORAL | 3 refills | Status: DC

## 2016-07-03 MED ORDER — ISOSORBIDE MONONITRATE ER 60 MG PO TB24: 60.0000 mg | ORAL_TABLET | Freq: Every day | ORAL | 3 refills | Status: DC

## 2016-07-03 NOTE — Addendum Note (Signed)
Addended by: Darylene Price A on: 07/03/2016 03:43 PM   Modules accepted: Orders

## 2016-07-03 NOTE — Patient Instructions (Signed)
Continue weighing daily and call for an overnight weight gain of > 2 pounds or a weekly weight gain of >5 pounds. 

## 2016-07-03 NOTE — Progress Notes (Signed)
Patient ID: Theresa [name], female    DOB: 19-Jun-1955, 61 y.o.   MRN: [id number]  HPI  Theresa [name] is a 61 y/o female with a history of PVD, HTN, hyperlipidemia, COPD, CAD with NSTEMI, anemia, previous tobacco use, DM, ESRD on dialysis and chronic heart failure.  Last echo was done 02/26/16 which showed an EF of 55-60%, mild AS and moderate/severe MR which is worse than previous echo done on 11/24/14. Right-sided pressure elevated at 50 mm Hg.   Last admitted on 05/21/16 with chest pain/near syncope. Ruled out for TIA/CVA. Head CT and MRI were negative. Carotid dopplers showed right & left ICA 50-69% stenosis. EEG normal.  Cardiology consult obtained. Isosorbide was added. Previously admitted on 02/22/16 with chest pain. Troponins were negative X 3. Cardiology consult was done. Was also diagnosed with pneumonia and treated with levaquin. Losartan was started and metoprolol increased due to HTN.  Patient presents today for a follow-up visit with minimal fatigue but no shortness of breath. No swelling in her ankles/abdomen. Weighing daily with slight weight loss. Continues with dialysis on M, W, F. Unable to tolerate wearing her CPAP due to claustrophobia sensation. Does wear oxygen at 2L as needed upon exertion. In reviewing medications, there are discrepancies from the discharge summary to what she's taking.  Past Medical History:  Diagnosis Date  . Anemia of chronic disease   . Carotid arterial disease ([place])    a. 02/2013 U/S: 40-59% bilat ICA stenosis - *f/u 02/2014*  . Chronic constipation   . Chronic diastolic CHF (congestive heart failure) ([place])    a. 10/2013 Echo [facility]): EF 55-60%, mod conc LVH, mod MR, mildly dil LA, mild Ao sclerosis w/o stenosis.  . Colon polyps   . COPD (chronic obstructive pulmonary disease) ([place])   . Coronary artery disease    a. 05/2013 NSTEMI/PCI: LM 20d, LAD min irregs, LCX small, nl, OM1 nl, RCA dom 86m (2.5x16 Promus DES), PDA1 80p.  . Diabetes mellitus   . Diabetic  neuropathy ([place])   . Diabetic retinopathy ([place]) 05/28/2013   Hx bilat retinal detachment, proliferative diab retinopathy and bilat vitreous hemorrhage   . Emphysema   . ESRD on hemodialysis ([place])    a. [facility] in [place], [place], on a TTS schedule.  She started dialysis in Feb 2014.  Etiology of renal failure not known, likely diabetes.  Has a left upper arm AV graft.  . History of bronchitis    Mar 2012  . History of pneumonia    June 2012  . History of tobacco abuse    a. Quit 2012.  Marland Kitchen Hyperlipidemia   . Hypertension   . Moderate mitral insufficiency    a. 10/2013 Echo: EF 55-60%, mod MR.  . Peripheral vascular disease ([place])   . Sickle cell trait [facility])    Past Surgical History:  Procedure Laterality Date  . ABDOMINAL HYSTERECTOMY     2000  . CARDIAC CATHETERIZATION    . CARDIAC CATHETERIZATION N/A 05/02/2015   Procedure: Left Heart Cath and Coronary Angiography;  Surgeon: [name], MD;  Location: [place] [facility];  Service: Cardiovascular;  Laterality: N/A;  . colonscopy    . CORONARY ANGIOPLASTY  05/28/2014   stent placement to the mid RCA  . DILATION AND CURETTAGE OF UTERUS     several in the early 80's  . ESOPHAGOGASTRODUODENOSCOPY     2012  . EYE SURGERY     bilateral laser 2012  . EYE SURGERY  right  . GAS INSERTION  09/30/2011   Procedure: INSERTION OF GAS;  Surgeon: [name], MD;  Location: [place];  Service: Ophthalmology;  Laterality: Right;  C3F8  . GAS/FLUID EXCHANGE  09/30/2011   Procedure: GAS/FLUID EXCHANGE;  Surgeon: [name], MD;  Location: [place];  Service: Ophthalmology;  Laterality: Right;  . LEFT HEART CATHETERIZATION WITH CORONARY ANGIOGRAM N/A 05/28/2013   Procedure: LEFT HEART CATHETERIZATION WITH CORONARY ANGIOGRAM;  Surgeon: [name], MD;  Location: [facility] CATH LAB;  Service: Cardiovascular;  Laterality: N/A;  . PARS PLANA VITRECTOMY  04/22/2011   Procedure: PARS PLANA VITRECTOMY WITH 25 GAUGE;  Surgeon: [name], MD;  Location: [place];  Service: Ophthalmology;  Laterality: Left;  membrane peel, endolaser, gas fluid exchange, silicone oil, repair of complex traction retinal detachment  . PARS PLANA VITRECTOMY  09/30/2011   Procedure: PARS PLANA VITRECTOMY WITH 25 GAUGE;  Surgeon: [name], MD;  Location: [place];  Service: Ophthalmology;  Laterality: Right;  Endolaser; Repair of Complex Traction Retinal Detachment  . PARS PLANA VITRECTOMY  02/24/2012   Procedure: PARS PLANA VITRECTOMY WITH 25 GAUGE;  Surgeon: [name], MD;  Location: [place];  Service: Ophthalmology;  Laterality: Left;  . PTCA    . SILICON OIL REMOVAL  4/78/2956   Procedure: SILICON OIL REMOVAL;  Surgeon: [name], MD;  Location: [place];  Service: Ophthalmology;  Laterality: Left;  . THROMBECTOMY / ARTERIOVENOUS GRAFT REVISION    . TUBAL LIGATION     1979   Family History  Problem Relation Age of Onset  . Stroke Mother   . Heart attack Mother   . Heart disease Mother   . Colon cancer Father   . Colon cancer Sister   . Heart attack Brother   . Stroke Brother   . Diabetes Brother   . Breast cancer Sister   . Diabetes Brother   . Diabetes Daughter   . Anesthesia problems Neg Hx   . Hypotension Neg Hx   . Malignant hyperthermia Neg Hx   . Pseudochol deficiency Neg Hx    Social History  Substance Use Topics  . Smoking status: Former Smoker    Packs/day: 1.00    Years: 25.00    Quit date: 06/09/2010  . Smokeless tobacco: Never Used  . Alcohol use No   Allergies  Allergen Reactions  . Hydrocodone Hives  . Metformin Diarrhea   Prior to Admission medications   Medication Sig Start Date End Date Taking? Authorizing Provider  acetaminophen (TYLENOL) 325 MG tablet Take 2 tablets (650 mg total) by mouth every 4 (four) hours as needed for headache or mild pain. 05/24/16  Yes Nicholes Mango, MD  albuterol (PROVENTIL HFA;VENTOLIN HFA) 108 (90 BASE) MCG/ACT inhaler Inhale 2 puffs into the lungs every 6 (six)  hours as needed for wheezing or shortness of breath.   Yes Historical Provider, MD  albuterol (PROVENTIL) (2.5 MG/3ML) 0.083% nebulizer solution Take 2.5 mg by nebulization every 4 (four) hours as needed for wheezing or shortness of breath.   Yes Historical Provider, MD  aspirin EC 81 MG tablet Take 81 mg by mouth daily.   Yes Historical Provider, MD  calcium acetate (PHOSLO) 667 MG capsule Take [phone] mg by mouth 3 (three) times daily with meals. Take 5 capsules if you have anything with cheese, otherwise, take 4 capsules if you don't have cheese.   Yes Historical Provider, MD  carvedilol (COREG) 25 MG tablet Take  1 tablet (25 mg total) by mouth 2 (two) times daily with a meal. Patient taking differently: Take 12.5 mg by mouth 2 (two) times daily with a meal. Take one tablet by mouth twice daily. If blood pressure is greater than 160 take two tablets by mouth twice daily. 02/23/16  Yes Bettey Costa, MD  cloNIDine (CATAPRES) 0.2 MG tablet Take 1 tablet (0.2 mg total) by mouth 2 (two) times daily. 05/24/16  Yes Nicholes Mango, MD  furosemide (LASIX) 80 MG tablet Take 80 mg by mouth daily.    Yes Historical Provider, MD  losartan (COZAAR) 50 MG tablet Take 1 tablet (50 mg total) by mouth daily. 03/24/16  Yes Alisa Graff, FNP  Melatonin 1 MG CAPS Take 1 capsule by mouth daily.   Yes Historical Provider, MD  multivitamin (RENA-VIT) TABS tablet Take 1 tablet by mouth daily. 03/24/16  Yes Alisa Graff, FNP  nitroGLYCERIN (NITROSTAT) 0.4 MG SL tablet Place 1 tablet (0.4 mg total) under the tongue every 5 (five) minutes as needed for chest pain. 09/26/15  Yes Alisa Graff, FNP  pregabalin (LYRICA) 75 MG capsule Take 1 capsule (75 mg total) by mouth daily. 04/21/16  Yes Burnard Hawthorne, FNP  crestor 40mg   Take 1 tablet daily                Review of Systems  Constitutional: Positive for fatigue. Negative for appetite change.  HENT: Positive for congestion. Negative for rhinorrhea and sore throat.    Eyes: Negative.   Respiratory: Negative for cough, chest tightness and shortness of breath.   Cardiovascular: Negative for chest pain, palpitations and leg swelling.  Gastrointestinal: Negative for abdominal distention and abdominal pain.  Endocrine: Negative.   Genitourinary: Negative.   Musculoskeletal: Negative for back pain and neck pain.  Skin: Negative.   Allergic/Immunologic: Negative.   Neurological: Negative for dizziness and light-headedness.  Hematological: Negative for adenopathy. Does not bruise/bleed easily.  Psychiatric/Behavioral: Negative for dysphoric mood and sleep disturbance (sleeping on 1 pillow). The patient is not nervous/anxious.    Vitals:   07/03/16 0910  BP: (!) 208/67  Pulse: 75  Resp: 18  SpO2: 97%  Weight: 170 lb 2 oz (77.2 kg)  Height: 5\' 7"  (1.702 m)   Wt Readings from Last 3 Encounters:  07/03/16 170 lb 2 oz (77.2 kg)  06/19/16 170 lb (77.1 kg)  05/23/16 162 lb 14.4 oz (73.9 kg)   Lab Results  Component Value Date   CREATININE 4.98 (H) 05/21/2016   CREATININE 10.11 (H) 02/23/2016   CREATININE 9.16 (H) 02/22/2016    Physical Exam  Constitutional: She is oriented to person, place, and time. She appears well-developed and well-nourished.  HENT:  Head: Normocephalic and atraumatic.  Eyes: Conjunctivae are normal. Pupils are equal, round, and reactive to light.  Neck: Normal range of motion. Neck supple. No JVD present.  Cardiovascular: Normal rate and regular rhythm.   Pulmonary/Chest: Effort normal. She has no wheezes. She has no rales.  Abdominal: Soft. She exhibits no distension. There is no tenderness.  Musculoskeletal: She exhibits no edema or tenderness.  Neurological: She is alert and oriented to person, place, and time.  Skin: Skin is warm and dry.  Psychiatric: She has a normal mood and affect. Her behavior is normal. Thought content normal.  Nursing note and vitals reviewed.    Assessment & Plan:  1: Chronic heart failure  with preserved ejection fraction- - NYHA class II today - euvolemic  - continue  daily weights and call for an overnight weight gain of >2 pounds/weekly weight gain of >5 pounds. Weight down 4 pounds from last visit of October 2017 - already received flu vaccine for this season - Last saw cardiology 02/05/16. Needs f/u appointment scheduled  2: HTN- - BP elevated today as she says that she just took her medications prior to coming to the office - checks her blood pressure at home and says that her blood pressure has been doing "good" - was supposed to be on isosorbide at discharge but she says that she didn't know what that medication was for so she didn't get it filled. Discussed the use of it and she's agreeable to taking it - was also supposed to increase clonidine which she didn't do. Will leave that medication dose the same at 0.1mg  twice daily since adding isosorbide  3: COPD- - Unable to tolerate CPAP - oxygen at 2L as needed  4: Hyperlipidemia- - at discharge she was supposed to stop crestor and begin lipitor. She didn't understand the reason for switching so she didn't switch - PharmD went in and reviewed medications and explained that crestor is not indicated for use in dialysis patients. She was agreeable to stopping it and beginning lipitor.   5: ESRD on hemodialysis- - dialysis on M, W, F - denies difficulty with dialysis - getting worked up for transplant list. Having a colonoscopy done 07/08/16  Medication bottles were reviewed. Return here in 2 months or sooner for any questions/problems before then.

## 2016-07-04 DIAGNOSIS — N2581 Secondary hyperparathyroidism of renal origin: Secondary | ICD-10-CM | POA: Diagnosis not present

## 2016-07-04 DIAGNOSIS — D631 Anemia in chronic kidney disease: Secondary | ICD-10-CM | POA: Diagnosis not present

## 2016-07-04 DIAGNOSIS — D509 Iron deficiency anemia, unspecified: Secondary | ICD-10-CM | POA: Diagnosis not present

## 2016-07-04 DIAGNOSIS — Z992 Dependence on renal dialysis: Secondary | ICD-10-CM | POA: Diagnosis not present

## 2016-07-04 DIAGNOSIS — N186 End stage renal disease: Secondary | ICD-10-CM | POA: Diagnosis not present

## 2016-07-07 ENCOUNTER — Encounter: Payer: Self-pay | Admitting: *Deleted

## 2016-07-07 ENCOUNTER — Other Ambulatory Visit
Admission: RE | Admit: 2016-07-07 | Discharge: 2016-07-07 | Disposition: A | Discharge status: Home / Self Care | Payer: Medicare Other | Source: Ambulatory Visit | Attending: Nephrology | Admitting: Nephrology

## 2016-07-07 DIAGNOSIS — Z992 Dependence on renal dialysis: Secondary | ICD-10-CM | POA: Diagnosis not present

## 2016-07-07 DIAGNOSIS — E875 Hyperkalemia: Secondary | ICD-10-CM | POA: Diagnosis not present

## 2016-07-07 DIAGNOSIS — D631 Anemia in chronic kidney disease: Secondary | ICD-10-CM | POA: Diagnosis not present

## 2016-07-07 DIAGNOSIS — N186 End stage renal disease: Secondary | ICD-10-CM | POA: Diagnosis not present

## 2016-07-07 DIAGNOSIS — N2581 Secondary hyperparathyroidism of renal origin: Secondary | ICD-10-CM | POA: Diagnosis not present

## 2016-07-07 DIAGNOSIS — D509 Iron deficiency anemia, unspecified: Secondary | ICD-10-CM | POA: Diagnosis not present

## 2016-07-07 LAB — POTASSIUM: Potassium: 2.9 mmol/L — ABNORMAL LOW (ref 3.5–5.1)

## 2016-07-08 ENCOUNTER — Ambulatory Visit: Payer: Medicare Other | Admitting: Anesthesiology

## 2016-07-08 ENCOUNTER — Encounter: Payer: Self-pay | Admitting: Anesthesiology

## 2016-07-08 ENCOUNTER — Encounter
Admission: RE | Disposition: A | Discharge status: Home / Self Care | Payer: Self-pay | Source: Ambulatory Visit | Attending: Gastroenterology

## 2016-07-08 ENCOUNTER — Ambulatory Visit
Admission: RE | Admit: 2016-07-08 | Discharge: 2016-07-08 | Disposition: A | Discharge status: Home / Self Care | Payer: Medicare Other | Source: Ambulatory Visit | Attending: Gastroenterology | Admitting: Gastroenterology

## 2016-07-08 DIAGNOSIS — Z800 Family history of malignant neoplasm of digestive organs: Secondary | ICD-10-CM | POA: Diagnosis not present

## 2016-07-08 DIAGNOSIS — Z79899 Other long term (current) drug therapy: Secondary | ICD-10-CM | POA: Diagnosis not present

## 2016-07-08 DIAGNOSIS — D573 Sickle-cell trait: Secondary | ICD-10-CM | POA: Insufficient documentation

## 2016-07-08 DIAGNOSIS — I2510 Atherosclerotic heart disease of native coronary artery without angina pectoris: Secondary | ICD-10-CM | POA: Insufficient documentation

## 2016-07-08 DIAGNOSIS — Z7982 Long term (current) use of aspirin: Secondary | ICD-10-CM | POA: Insufficient documentation

## 2016-07-08 DIAGNOSIS — J449 Chronic obstructive pulmonary disease, unspecified: Secondary | ICD-10-CM | POA: Diagnosis not present

## 2016-07-08 DIAGNOSIS — E1151 Type 2 diabetes mellitus with diabetic peripheral angiopathy without gangrene: Secondary | ICD-10-CM | POA: Insufficient documentation

## 2016-07-08 DIAGNOSIS — I132 Hypertensive heart and chronic kidney disease with heart failure and with stage 5 chronic kidney disease, or end stage renal disease: Secondary | ICD-10-CM | POA: Insufficient documentation

## 2016-07-08 DIAGNOSIS — Z1211 Encounter for screening for malignant neoplasm of colon: Secondary | ICD-10-CM | POA: Insufficient documentation

## 2016-07-08 DIAGNOSIS — E11319 Type 2 diabetes mellitus with unspecified diabetic retinopathy without macular edema: Secondary | ICD-10-CM | POA: Insufficient documentation

## 2016-07-08 DIAGNOSIS — E785 Hyperlipidemia, unspecified: Secondary | ICD-10-CM | POA: Diagnosis not present

## 2016-07-08 DIAGNOSIS — Z87891 Personal history of nicotine dependence: Secondary | ICD-10-CM | POA: Diagnosis not present

## 2016-07-08 DIAGNOSIS — K635 Polyp of colon: Secondary | ICD-10-CM | POA: Diagnosis not present

## 2016-07-08 DIAGNOSIS — I252 Old myocardial infarction: Secondary | ICD-10-CM | POA: Insufficient documentation

## 2016-07-08 DIAGNOSIS — E1140 Type 2 diabetes mellitus with diabetic neuropathy, unspecified: Secondary | ICD-10-CM | POA: Insufficient documentation

## 2016-07-08 DIAGNOSIS — D122 Benign neoplasm of ascending colon: Secondary | ICD-10-CM | POA: Insufficient documentation

## 2016-07-08 DIAGNOSIS — Z992 Dependence on renal dialysis: Secondary | ICD-10-CM | POA: Insufficient documentation

## 2016-07-08 DIAGNOSIS — D124 Benign neoplasm of descending colon: Secondary | ICD-10-CM | POA: Diagnosis not present

## 2016-07-08 DIAGNOSIS — I5032 Chronic diastolic (congestive) heart failure: Secondary | ICD-10-CM | POA: Diagnosis not present

## 2016-07-08 DIAGNOSIS — I6523 Occlusion and stenosis of bilateral carotid arteries: Secondary | ICD-10-CM | POA: Diagnosis not present

## 2016-07-08 DIAGNOSIS — K648 Other hemorrhoids: Secondary | ICD-10-CM | POA: Diagnosis not present

## 2016-07-08 DIAGNOSIS — K640 First degree hemorrhoids: Secondary | ICD-10-CM | POA: Diagnosis not present

## 2016-07-08 DIAGNOSIS — Z980 Intestinal bypass and anastomosis status: Secondary | ICD-10-CM | POA: Diagnosis not present

## 2016-07-08 DIAGNOSIS — K5909 Other constipation: Secondary | ICD-10-CM | POA: Diagnosis not present

## 2016-07-08 DIAGNOSIS — E1122 Type 2 diabetes mellitus with diabetic chronic kidney disease: Secondary | ICD-10-CM | POA: Insufficient documentation

## 2016-07-08 DIAGNOSIS — N186 End stage renal disease: Secondary | ICD-10-CM | POA: Diagnosis not present

## 2016-07-08 HISTORY — PX: COLONOSCOPY WITH PROPOFOL: SHX5780

## 2016-07-08 LAB — GLUCOSE, CAPILLARY: GLUCOSE-CAPILLARY: 133 mg/dL — AB (ref 65–99)

## 2016-07-08 SURGERY — COLONOSCOPY WITH PROPOFOL
Anesthesia: General

## 2016-07-08 MED ORDER — SODIUM CHLORIDE 0.9 % IV SOLN
INTRAVENOUS | Status: DC
  Administered 2016-07-08: 1000 mL via INTRAVENOUS

## 2016-07-08 MED ORDER — LIDOCAINE 2% (20 MG/ML) 5 ML SYRINGE
INTRAMUSCULAR | Status: DC | PRN
  Administered 2016-07-08: 80 mg via INTRAVENOUS

## 2016-07-08 MED ORDER — PROPOFOL 500 MG/50ML IV EMUL
INTRAVENOUS | Status: DC | PRN
  Administered 2016-07-08: 180 ug/kg/min via INTRAVENOUS

## 2016-07-08 MED ORDER — PROPOFOL 10 MG/ML IV BOLUS
INTRAVENOUS | Status: DC | PRN
  Administered 2016-07-08 (×2): 30 mg via INTRAVENOUS

## 2016-07-08 MED ORDER — PROPOFOL 500 MG/50ML IV EMUL
INTRAVENOUS | Status: AC
  Filled 2016-07-08: qty 50

## 2016-07-08 NOTE — Anesthesia Postprocedure Evaluation (Signed)
Anesthesia Post Note  Patient: Theresa [name]  Procedure(s) Performed: Procedure(s) (LRB): COLONOSCOPY WITH PROPOFOL (N/A)  Patient location during evaluation: Endoscopy Anesthesia Type: General Level of consciousness: awake and alert Pain management: pain level controlled Vital Signs Assessment: post-procedure vital signs reviewed and stable Respiratory status: spontaneous breathing, nonlabored ventilation, respiratory function stable and patient connected to nasal cannula oxygen Cardiovascular status: blood pressure returned to baseline and stable Postop Assessment: no signs of nausea or vomiting Anesthetic complications: no     Last Vitals:  Vitals:   07/08/16 0910 07/08/16 0920  BP: 121/81 121/81  Pulse: 69 69  Resp: (!) 8 (!) 22  Temp:      Last Pain:  Vitals:   07/08/16 0900  TempSrc: Tympanic                 Martha Clan

## 2016-07-08 NOTE — Transfer of Care (Signed)
Immediate Anesthesia Transfer of Care Note  Patient: Theresa [name]  Procedure(s) Performed: Procedure(s): COLONOSCOPY WITH PROPOFOL (N/A)  Patient Location: Endoscopy Unit  Anesthesia Type:General  Level of Consciousness: awake  Airway & Oxygen Therapy: Patient connected to nasal cannula oxygen  Post-op Assessment: Post -op Vital signs reviewed and stable  Post vital signs: stable  Last Vitals:  Vitals:   07/08/16 0659 07/08/16 0909  BP: (!) 149/51 (!) 113/50  Pulse: 74 66  Resp: 16 16  Temp: 36.6 C     Last Pain: There were no vitals filed for this visit.       Complications: No apparent anesthesia complications

## 2016-07-08 NOTE — Op Note (Signed)
[facility] Patient Name: Theresa [name] Procedure Date: 07/08/2016 7:45 AM MRN: [id number] Account #: [id number] Date of Birth: 1956-01-22 Admit Type: Outpatient Age: 61 Room: [facility] ENDO ROOM 4 Gender: Female Note Status: Finalized Procedure:            Colonoscopy Indications:          Screening in patient at increased risk: Family history                        of 1st-degree relative with colorectal cancer Providers:            [name] MD, MD Referring MD:         Yvetta Coder. Arnett (Referring MD) Medicines:            Monitored Anesthesia Care Complications:        No immediate complications. Procedure:            Pre-Anesthesia Assessment:                       - Prior to the procedure, a History and Physical was                        performed, and patient medications, allergies and                        sensitivities were reviewed. The patient's tolerance of                        previous anesthesia was reviewed.                       - The risks and benefits of the procedure and the                        sedation options and risks were discussed with the                        patient. All questions were answered and informed                        consent was obtained.                       - The risks and benefits of the procedure and the                        sedation options and risks were discussed with the                        patient. All questions were answered and informed                        consent was obtained.                       - ASA Grade Assessment: IV - A patient with severe                        systemic disease that is a constant threat to life.  After obtaining informed consent, the colonoscope was                        passed under direct vision. Throughout the procedure,                        the patient's blood pressure, pulse, and oxygen                        saturations were  monitored continuously. The                        Colonoscope was introduced through the anus and                        advanced to the the cecum, identified by the                        appendiceal orifice, IC valve and transillumination.                        The colonoscopy was performed with moderate difficulty                        due to multiple polyps. The patient tolerated the                        procedure well. The quality of the bowel preparation                        was good. Findings:      The perianal and digital rectal examinations were normal.      Multiple semi-sessile polyps were found in the ascending colon. The       polyps were 9 to 10 mm in size. These polyps were removed with a hot       snare. Resection and retrieval were complete.      A 20 mm polyp was found in the descending colon. The polyp was       semi-pedunculated. The polyp was removed with a hot snare. Resection and       retrieval were complete. To prevent bleeding after the polypectomy, two       hemostatic clips were successfully placed. There was no bleeding during,       or at the end, of the procedure. the polyp was retrieved using a roth net      Two sessile polyps were found in the descending colon. The polyps were 6       to 8 mm in size. These polyps were removed with a cold snare. Resection       and retrieval were complete.      Non-bleeding internal hemorrhoids were found during endoscopy. The       hemorrhoids were large and Grade I (internal hemorrhoids that do not       prolapse). Impression:           - Multiple 9 to 10 mm polyps in the ascending colon,                        removed with a hot snare. Resected and retrieved.                       -  One 20 mm polyp in the descending colon, removed with                        a hot snare. Resected and retrieved. Clips were placed.                       - Two 6 to 8 mm polyps in the descending colon, removed                         with a cold snare. Resected and retrieved.                       - Non-bleeding internal hemorrhoids. Recommendation:       - Discharge patient to home (with escort).                       - Resume previous diet.                       - Await pathology results.                       - Repeat colonoscopy in 1 year for surveillance.                       - due to large number of polyps and extensive spasm                        during the procedure to ensure no other polyps were                        missed                       - Avoid NSAIDS for 2 weeks Procedure Code(s):    --- Professional ---                       [id number], Colonoscopy, flexible; with removal of tumor(s),                        polyp(s), or other lesion(s) by snare technique Diagnosis Code(s):    --- Professional ---                       Z80.0, Family history of malignant neoplasm of                        digestive organs                       D12.2, Benign neoplasm of ascending colon                       D12.4, Benign neoplasm of descending colon                       K64.0, First degree hemorrhoids CPT copyright 2016 American Medical Association. All rights reserved. The codes documented in this report are preliminary and upon coder review may  be revised to meet current compliance requirements. [name], MD [name] MD, MD 07/08/2016 9:07:11 AM This report has been  signed electronically. Number of Addenda: 0 Note Initiated On: 07/08/2016 7:45 AM Scope Withdrawal Time: 0 hours 44 minutes 29 seconds  Total Procedure Duration: 0 hours 52 minutes 21 seconds       [facility]

## 2016-07-08 NOTE — Anesthesia Preprocedure Evaluation (Signed)
Anesthesia Evaluation  Patient identified by MRN, date of birth, ID band Patient awake    Reviewed: Allergy & Precautions, H&P , NPO status , Patient's Chart, lab work & pertinent test results, reviewed documented beta blocker date and time   History of Anesthesia Complications Negative for: history of anesthetic complications  Airway Mallampati: III  TM Distance: >3 FB Neck ROM: full    Dental  (+) Poor Dentition, Missing   Pulmonary neg shortness of breath, neg sleep apnea, COPD, neg recent URI, former smoker,           Cardiovascular Exercise Tolerance: Good hypertension, On Medications (-) angina+ CAD, + Past MI, + Cardiac Stents, + Peripheral Vascular Disease and +CHF  (-) CABG (-) dysrhythmias + Valvular Problems/Murmurs      Neuro/Psych neg Seizures  Neuromuscular disease negative psych ROS   GI/Hepatic negative GI ROS, Neg liver ROS,   Endo/Other  diabetes (no longer on meds), Well Controlled  Renal/GU ESRF and DialysisRenal disease  negative genitourinary   Musculoskeletal   Abdominal   Peds  Hematology  (+) Blood dyscrasia, Sickle cell trait and anemia ,   Anesthesia Other Findings Past Medical History: No date: Anemia of chronic disease No date: Carotid arterial disease ([place])     Comment: a. 02/2013 U/S: 40-59% bilat ICA stenosis -               *f/u 02/2014* No date: Chronic constipation No date: Chronic diastolic CHF (congestive heart failur*     Comment: a. 10/2013 Echo [facility]): EF 55-60%, mod conc               LVH, mod MR, mildly dil LA, mild Ao sclerosis               w/o stenosis. No date: Colon polyps No date: COPD (chronic obstructive pulmonary disease) (* No date: Coronary artery disease     Comment: a. 05/2013 NSTEMI/PCI: LM 20d, LAD min irregs,              LCX small, nl, OM1 nl, RCA dom 24m (2.5x16               Promus DES), PDA1 80p. No date: Diabetes mellitus No date: Diabetic  neuropathy ([place]) 05/28/2013: Diabetic retinopathy ([place])     Comment: Hx bilat retinal detachment, proliferative               diab retinopathy and bilat vitreous hemorrhage  No date: Emphysema No date: ESRD on hemodialysis ([place])     Comment: a. [facility] in [place], [place], on a TTS               schedule.  She started dialysis in Feb 2014.                Etiology of renal failure not known, likely               diabetes.  Has a left upper arm AV graft. No date: History of bronchitis     Comment: Mar 2012 No date: History of pneumonia     Comment: June 2012 No date: History of tobacco abuse     Comment: a. Quit 2012. No date: Hyperlipidemia No date: Hypertension No date: Moderate mitral insufficiency     Comment: a. 10/2013 Echo: EF 55-60%, mod MR. No date: Peripheral vascular disease ([place]) No date: Sickle cell trait ([facility])   Reproductive/Obstetrics negative OB ROS  Anesthesia Physical Anesthesia Plan  ASA: IV  Anesthesia Plan: General   Post-op Pain Management:    Induction:   Airway Management Planned:   Additional Equipment:   Intra-op Plan:   Post-operative Plan:   Informed Consent: I have reviewed the patients History and Physical, chart, labs and discussed the procedure including the risks, benefits and alternatives for the proposed anesthesia with the patient or authorized representative who has indicated his/her understanding and acceptance.   Dental Advisory Given  Plan Discussed with: Anesthesiologist, CRNA and Surgeon  Anesthesia Plan Comments:         Anesthesia Quick Evaluation

## 2016-07-08 NOTE — H&P (Signed)
[name] MD [address], [place], [place] [address] Phone: [phone] Fax : [phone]  Primary Care Physician:  Mable Paris, FNP Primary Gastroenterologist:  Dr. [name]   Pre-Procedure History & Physical: HPI:  Theresa [name] is a 61 y.o. female is here for an colonoscopy.   Past Medical History:  Diagnosis Date  . Anemia of chronic disease   . Carotid arterial disease ([place])    a. 02/2013 U/S: 40-59% bilat ICA stenosis - *f/u 02/2014*  . Chronic constipation   . Chronic diastolic CHF (congestive heart failure) ([place])    a. 10/2013 Echo [facility]): EF 55-60%, mod conc LVH, mod MR, mildly dil LA, mild Ao sclerosis w/o stenosis.  . Colon polyps   . COPD (chronic obstructive pulmonary disease) ([place])   . Coronary artery disease    a. 05/2013 NSTEMI/PCI: LM 20d, LAD min irregs, LCX small, nl, OM1 nl, RCA dom 25m (2.5x16 Promus DES), PDA1 80p.  . Diabetes mellitus   . Diabetic neuropathy ([place])   . Diabetic retinopathy ([place]) 05/28/2013   Hx bilat retinal detachment, proliferative diab retinopathy and bilat vitreous hemorrhage   . Emphysema   . ESRD on hemodialysis ([place])    a. [facility] in [place], [place], on a TTS schedule.  She started dialysis in Feb 2014.  Etiology of renal failure not known, likely diabetes.  Has a left upper arm AV graft.  . History of bronchitis    Mar 2012  . History of pneumonia    June 2012  . History of tobacco abuse    a. Quit 2012.  Marland Kitchen Hyperlipidemia   . Hypertension   . Moderate mitral insufficiency    a. 10/2013 Echo: EF 55-60%, mod MR.  . Peripheral vascular disease ([place])   . Sickle cell trait [facility])     Past Surgical History:  Procedure Laterality Date  . ABDOMINAL HYSTERECTOMY     2000  . CARDIAC CATHETERIZATION    . CARDIAC CATHETERIZATION N/A 05/02/2015   Procedure: Left Heart Cath and Coronary Angiography;  Surgeon: [name], MD;  Location: [place] CV LAB;  Service: Cardiovascular;  Laterality: N/A;  . colonscopy     . CORONARY ANGIOPLASTY  05/28/2014   stent placement to the mid RCA  . DILATION AND CURETTAGE OF UTERUS     several in the early 80's  . ESOPHAGOGASTRODUODENOSCOPY     2012  . EYE SURGERY     bilateral laser 2012  . EYE SURGERY     right  . GAS INSERTION  09/30/2011   Procedure: INSERTION OF GAS;  Surgeon: [name], MD;  Location: [place];  Service: Ophthalmology;  Laterality: Right;  C3F8  . GAS/FLUID EXCHANGE  09/30/2011   Procedure: GAS/FLUID EXCHANGE;  Surgeon: [name], MD;  Location: [place];  Service: Ophthalmology;  Laterality: Right;  . LEFT HEART CATHETERIZATION WITH CORONARY ANGIOGRAM N/A 05/28/2013   Procedure: LEFT HEART CATHETERIZATION WITH CORONARY ANGIOGRAM;  Surgeon: [name], MD;  Location: [facility] CATH LAB;  Service: Cardiovascular;  Laterality: N/A;  . PARS PLANA VITRECTOMY  04/22/2011   Procedure: PARS PLANA VITRECTOMY WITH 25 GAUGE;  Surgeon: [name], MD;  Location: [place];  Service: Ophthalmology;  Laterality: Left;  membrane peel, endolaser, gas fluid exchange, silicone oil, repair of complex traction retinal detachment  . PARS PLANA VITRECTOMY  09/30/2011   Procedure: PARS PLANA VITRECTOMY WITH 25 GAUGE;  Surgeon: [name], MD;  Location: [place];  Service:  Ophthalmology;  Laterality: Right;  Endolaser; Repair of Complex Traction Retinal Detachment  . PARS PLANA VITRECTOMY  02/24/2012   Procedure: PARS PLANA VITRECTOMY WITH 25 GAUGE;  Surgeon: [name], MD;  Location: [place];  Service: Ophthalmology;  Laterality: Left;  . PTCA    . SILICON OIL REMOVAL  6/64/4034   Procedure: SILICON OIL REMOVAL;  Surgeon: [name], MD;  Location: [place];  Service: Ophthalmology;  Laterality: Left;  . THROMBECTOMY / ARTERIOVENOUS GRAFT REVISION    . TUBAL LIGATION     1979    Prior to Admission medications   Medication Sig Start Date End Date Taking? Authorizing Provider  acetaminophen (TYLENOL) 325 MG tablet Take 2 tablets (650 mg total)  by mouth every 4 (four) hours as needed for headache or mild pain. 05/24/16  Yes Nicholes Mango, MD  albuterol (PROVENTIL HFA;VENTOLIN HFA) 108 (90 BASE) MCG/ACT inhaler Inhale 2 puffs into the lungs every 6 (six) hours as needed for wheezing or shortness of breath.   Yes Historical Provider, MD  albuterol (PROVENTIL) (2.5 MG/3ML) 0.083% nebulizer solution Take 2.5 mg by nebulization every 4 (four) hours as needed for wheezing or shortness of breath.   Yes Historical Provider, MD  aspirin EC 81 MG tablet Take 81 mg by mouth daily.   Yes Historical Provider, MD  atorvastatin (LIPITOR) 40 MG tablet Take 1 tablet (40 mg total) by mouth daily. 07/03/16 10/01/16 Yes Alisa Graff, FNP  calcium acetate (PHOSLO) 667 MG capsule Take [phone] mg by mouth 3 (three) times daily with meals. Take 5 capsules if you have anything with cheese, otherwise, take 4 capsules if you don't have cheese.   Yes Historical Provider, MD  carvedilol (COREG) 25 MG tablet Take 1 tablet (25 mg total) by mouth 2 (two) times daily with a meal. Patient taking differently: Take 12.5 mg by mouth 2 (two) times daily with a meal. Take one tablet by mouth twice daily. If blood pressure is greater than 160 take two tablets by mouth twice daily. 02/23/16  Yes Bettey Costa, MD  cloNIDine (CATAPRES) 0.1 MG tablet Take 0.1 mg by mouth 2 (two) times daily.   Yes Historical Provider, MD  furosemide (LASIX) 80 MG tablet Take 80 mg by mouth daily.    Yes Historical Provider, MD  isosorbide mononitrate (IMDUR) 60 MG 24 hr tablet Take 1 tablet (60 mg total) by mouth daily. 07/03/16 10/01/16 Yes Alisa Graff, FNP  Melatonin 1 MG CAPS Take 1 capsule by mouth daily.   Yes Historical Provider, MD  multivitamin (RENA-VIT) TABS tablet Take 1 tablet by mouth daily. 03/24/16  Yes Alisa Graff, FNP  nitroGLYCERIN (NITROSTAT) 0.4 MG SL tablet Place 1 tablet (0.4 mg total) under the tongue every 5 (five) minutes as needed for chest pain. 09/26/15  Yes Alisa Graff,  FNP  pregabalin (LYRICA) 75 MG capsule Take 1 capsule (75 mg total) by mouth daily. 04/21/16  Yes Burnard Hawthorne, FNP  losartan (COZAAR) 50 MG tablet Take 1 tablet (50 mg total) by mouth daily. Patient not taking: Reported on 07/08/2016 03/24/16   Alisa Graff, FNP    Allergies as of 06/30/2016 - Review Complete 06/19/2016  Allergen Reaction Noted  . Hydrocodone Hives 08/20/2012  . Metformin Diarrhea 10/26/2013    Family History  Problem Relation Age of Onset  . Stroke Mother   . Heart attack Mother   . Heart disease Mother   . Colon cancer Father   .  Colon cancer Sister   . Heart attack Brother   . Stroke Brother   . Diabetes Brother   . Breast cancer Sister   . Diabetes Brother   . Diabetes Daughter   . Anesthesia problems Neg Hx   . Hypotension Neg Hx   . Malignant hyperthermia Neg Hx   . Pseudochol deficiency Neg Hx     Social History   Social History  . Marital status: Single    Spouse name: N/A  . Number of children: 2  . Years of education: N/A   Occupational History  . Not on file.   Social History Main Topics  . Smoking status: Former Smoker    Packs/day: 1.00    Years: 25.00    Quit date: 06/09/2010  . Smokeless tobacco: Never Used  . Alcohol use No  . Drug use: No  . Sexual activity: No   Other Topics Concern  . Not on file   Social History Narrative   On disability.    Lives alone.    2 children      Son drives her since her vision has decreased       Review of Systems: See HPI, otherwise negative ROS  Physical Exam: BP (!) 149/51   Pulse 74   Temp 97.9 F (36.6 C)   Resp 16   Ht 5' 7.5" (1.715 m)   Wt 170 lb (77.1 kg)   SpO2 99%   BMI 26.23 kg/m  General:   Alert,  pleasant and cooperative in NAD Head:  Normocephalic and atraumatic. Neck:  Supple; no masses or thyromegaly. Lungs:  Clear throughout to auscultation.    Heart:  Regular rate and rhythm. Abdomen:  Soft, nontender and nondistended. Normal bowel sounds,  without guarding, and without rebound.   Neurologic:  Alert and  oriented x4;  grossly normal neurologically.  Impression/Plan: Theresa [name] is here for an colonoscopy to be performed for pre transplant evaluation to rule out polyps of the colon   Risks, benefits, limitations, and alternatives regarding  colonoscopy have been reviewed with the patient.  Questions have been answered.  All parties agreeable.   [name], MD  07/08/2016, 7:48 AM

## 2016-07-08 NOTE — Anesthesia Post-op Follow-up Note (Cosign Needed)
Anesthesia QCDR form completed.        

## 2016-07-09 ENCOUNTER — Other Ambulatory Visit
Admission: RE | Admit: 2016-07-09 | Discharge: 2016-07-09 | Disposition: A | Discharge status: Home / Self Care | Payer: Medicare Other | Source: Ambulatory Visit | Attending: Nephrology | Admitting: Nephrology

## 2016-07-09 ENCOUNTER — Encounter: Payer: Self-pay | Admitting: Gastroenterology

## 2016-07-09 DIAGNOSIS — Z992 Dependence on renal dialysis: Secondary | ICD-10-CM | POA: Diagnosis not present

## 2016-07-09 DIAGNOSIS — E875 Hyperkalemia: Secondary | ICD-10-CM | POA: Diagnosis not present

## 2016-07-09 DIAGNOSIS — N2581 Secondary hyperparathyroidism of renal origin: Secondary | ICD-10-CM | POA: Diagnosis not present

## 2016-07-09 DIAGNOSIS — D631 Anemia in chronic kidney disease: Secondary | ICD-10-CM | POA: Diagnosis not present

## 2016-07-09 DIAGNOSIS — N186 End stage renal disease: Secondary | ICD-10-CM | POA: Diagnosis not present

## 2016-07-09 DIAGNOSIS — D509 Iron deficiency anemia, unspecified: Secondary | ICD-10-CM | POA: Diagnosis not present

## 2016-07-09 LAB — SURGICAL PATHOLOGY

## 2016-07-09 LAB — POTASSIUM: POTASSIUM: 4.5 mmol/L (ref 3.5–5.1)

## 2016-07-11 DIAGNOSIS — D631 Anemia in chronic kidney disease: Secondary | ICD-10-CM | POA: Diagnosis not present

## 2016-07-11 DIAGNOSIS — N2581 Secondary hyperparathyroidism of renal origin: Secondary | ICD-10-CM | POA: Diagnosis not present

## 2016-07-11 DIAGNOSIS — D509 Iron deficiency anemia, unspecified: Secondary | ICD-10-CM | POA: Diagnosis not present

## 2016-07-11 DIAGNOSIS — N186 End stage renal disease: Secondary | ICD-10-CM | POA: Diagnosis not present

## 2016-07-11 DIAGNOSIS — Z992 Dependence on renal dialysis: Secondary | ICD-10-CM | POA: Diagnosis not present

## 2016-07-14 DIAGNOSIS — N186 End stage renal disease: Secondary | ICD-10-CM | POA: Diagnosis not present

## 2016-07-14 DIAGNOSIS — D509 Iron deficiency anemia, unspecified: Secondary | ICD-10-CM | POA: Diagnosis not present

## 2016-07-14 DIAGNOSIS — N2581 Secondary hyperparathyroidism of renal origin: Secondary | ICD-10-CM | POA: Diagnosis not present

## 2016-07-14 DIAGNOSIS — Z992 Dependence on renal dialysis: Secondary | ICD-10-CM | POA: Diagnosis not present

## 2016-07-14 DIAGNOSIS — D631 Anemia in chronic kidney disease: Secondary | ICD-10-CM | POA: Diagnosis not present

## 2016-07-16 DIAGNOSIS — Z992 Dependence on renal dialysis: Secondary | ICD-10-CM | POA: Diagnosis not present

## 2016-07-16 DIAGNOSIS — D631 Anemia in chronic kidney disease: Secondary | ICD-10-CM | POA: Diagnosis not present

## 2016-07-16 DIAGNOSIS — N186 End stage renal disease: Secondary | ICD-10-CM | POA: Diagnosis not present

## 2016-07-16 DIAGNOSIS — D509 Iron deficiency anemia, unspecified: Secondary | ICD-10-CM | POA: Diagnosis not present

## 2016-07-16 DIAGNOSIS — N2581 Secondary hyperparathyroidism of renal origin: Secondary | ICD-10-CM | POA: Diagnosis not present

## 2016-07-18 DIAGNOSIS — N186 End stage renal disease: Secondary | ICD-10-CM | POA: Diagnosis not present

## 2016-07-18 DIAGNOSIS — Z992 Dependence on renal dialysis: Secondary | ICD-10-CM | POA: Diagnosis not present

## 2016-07-18 DIAGNOSIS — D631 Anemia in chronic kidney disease: Secondary | ICD-10-CM | POA: Diagnosis not present

## 2016-07-18 DIAGNOSIS — D509 Iron deficiency anemia, unspecified: Secondary | ICD-10-CM | POA: Diagnosis not present

## 2016-07-18 DIAGNOSIS — N2581 Secondary hyperparathyroidism of renal origin: Secondary | ICD-10-CM | POA: Diagnosis not present

## 2016-07-21 DIAGNOSIS — D509 Iron deficiency anemia, unspecified: Secondary | ICD-10-CM | POA: Diagnosis not present

## 2016-07-21 DIAGNOSIS — Z992 Dependence on renal dialysis: Secondary | ICD-10-CM | POA: Diagnosis not present

## 2016-07-21 DIAGNOSIS — N186 End stage renal disease: Secondary | ICD-10-CM | POA: Diagnosis not present

## 2016-07-21 DIAGNOSIS — N2581 Secondary hyperparathyroidism of renal origin: Secondary | ICD-10-CM | POA: Diagnosis not present

## 2016-07-21 DIAGNOSIS — D631 Anemia in chronic kidney disease: Secondary | ICD-10-CM | POA: Diagnosis not present

## 2016-07-23 DIAGNOSIS — D631 Anemia in chronic kidney disease: Secondary | ICD-10-CM | POA: Diagnosis not present

## 2016-07-23 DIAGNOSIS — Z992 Dependence on renal dialysis: Secondary | ICD-10-CM | POA: Diagnosis not present

## 2016-07-23 DIAGNOSIS — N2581 Secondary hyperparathyroidism of renal origin: Secondary | ICD-10-CM | POA: Diagnosis not present

## 2016-07-23 DIAGNOSIS — D509 Iron deficiency anemia, unspecified: Secondary | ICD-10-CM | POA: Diagnosis not present

## 2016-07-23 DIAGNOSIS — N186 End stage renal disease: Secondary | ICD-10-CM | POA: Diagnosis not present

## 2016-07-25 DIAGNOSIS — N2581 Secondary hyperparathyroidism of renal origin: Secondary | ICD-10-CM | POA: Diagnosis not present

## 2016-07-25 DIAGNOSIS — D631 Anemia in chronic kidney disease: Secondary | ICD-10-CM | POA: Diagnosis not present

## 2016-07-25 DIAGNOSIS — D509 Iron deficiency anemia, unspecified: Secondary | ICD-10-CM | POA: Diagnosis not present

## 2016-07-25 DIAGNOSIS — Z992 Dependence on renal dialysis: Secondary | ICD-10-CM | POA: Diagnosis not present

## 2016-07-25 DIAGNOSIS — N186 End stage renal disease: Secondary | ICD-10-CM | POA: Diagnosis not present

## 2016-07-28 DIAGNOSIS — N2581 Secondary hyperparathyroidism of renal origin: Secondary | ICD-10-CM | POA: Diagnosis not present

## 2016-07-28 DIAGNOSIS — D631 Anemia in chronic kidney disease: Secondary | ICD-10-CM | POA: Diagnosis not present

## 2016-07-28 DIAGNOSIS — E119 Type 2 diabetes mellitus without complications: Secondary | ICD-10-CM | POA: Diagnosis not present

## 2016-07-28 DIAGNOSIS — Z992 Dependence on renal dialysis: Secondary | ICD-10-CM | POA: Diagnosis not present

## 2016-07-28 DIAGNOSIS — N186 End stage renal disease: Secondary | ICD-10-CM | POA: Diagnosis not present

## 2016-07-28 DIAGNOSIS — D509 Iron deficiency anemia, unspecified: Secondary | ICD-10-CM | POA: Diagnosis not present

## 2016-07-30 DIAGNOSIS — Z992 Dependence on renal dialysis: Secondary | ICD-10-CM | POA: Diagnosis not present

## 2016-07-30 DIAGNOSIS — D509 Iron deficiency anemia, unspecified: Secondary | ICD-10-CM | POA: Diagnosis not present

## 2016-07-30 DIAGNOSIS — N2581 Secondary hyperparathyroidism of renal origin: Secondary | ICD-10-CM | POA: Diagnosis not present

## 2016-07-30 DIAGNOSIS — D631 Anemia in chronic kidney disease: Secondary | ICD-10-CM | POA: Diagnosis not present

## 2016-07-30 DIAGNOSIS — N186 End stage renal disease: Secondary | ICD-10-CM | POA: Diagnosis not present

## 2016-08-01 DIAGNOSIS — D509 Iron deficiency anemia, unspecified: Secondary | ICD-10-CM | POA: Diagnosis not present

## 2016-08-01 DIAGNOSIS — N186 End stage renal disease: Secondary | ICD-10-CM | POA: Diagnosis not present

## 2016-08-01 DIAGNOSIS — N2581 Secondary hyperparathyroidism of renal origin: Secondary | ICD-10-CM | POA: Diagnosis not present

## 2016-08-01 DIAGNOSIS — Z992 Dependence on renal dialysis: Secondary | ICD-10-CM | POA: Diagnosis not present

## 2016-08-01 DIAGNOSIS — D631 Anemia in chronic kidney disease: Secondary | ICD-10-CM | POA: Diagnosis not present

## 2016-08-04 DIAGNOSIS — D631 Anemia in chronic kidney disease: Secondary | ICD-10-CM | POA: Diagnosis not present

## 2016-08-04 DIAGNOSIS — N2581 Secondary hyperparathyroidism of renal origin: Secondary | ICD-10-CM | POA: Diagnosis not present

## 2016-08-04 DIAGNOSIS — Z992 Dependence on renal dialysis: Secondary | ICD-10-CM | POA: Diagnosis not present

## 2016-08-04 DIAGNOSIS — D509 Iron deficiency anemia, unspecified: Secondary | ICD-10-CM | POA: Diagnosis not present

## 2016-08-04 DIAGNOSIS — N186 End stage renal disease: Secondary | ICD-10-CM | POA: Diagnosis not present

## 2016-08-06 DIAGNOSIS — N2581 Secondary hyperparathyroidism of renal origin: Secondary | ICD-10-CM | POA: Diagnosis not present

## 2016-08-06 DIAGNOSIS — Z992 Dependence on renal dialysis: Secondary | ICD-10-CM | POA: Diagnosis not present

## 2016-08-06 DIAGNOSIS — D631 Anemia in chronic kidney disease: Secondary | ICD-10-CM | POA: Diagnosis not present

## 2016-08-06 DIAGNOSIS — N186 End stage renal disease: Secondary | ICD-10-CM | POA: Diagnosis not present

## 2016-08-06 DIAGNOSIS — D509 Iron deficiency anemia, unspecified: Secondary | ICD-10-CM | POA: Diagnosis not present

## 2016-08-08 DIAGNOSIS — N2581 Secondary hyperparathyroidism of renal origin: Secondary | ICD-10-CM | POA: Diagnosis not present

## 2016-08-08 DIAGNOSIS — D509 Iron deficiency anemia, unspecified: Secondary | ICD-10-CM | POA: Diagnosis not present

## 2016-08-08 DIAGNOSIS — D631 Anemia in chronic kidney disease: Secondary | ICD-10-CM | POA: Diagnosis not present

## 2016-08-08 DIAGNOSIS — N186 End stage renal disease: Secondary | ICD-10-CM | POA: Diagnosis not present

## 2016-08-08 DIAGNOSIS — Z992 Dependence on renal dialysis: Secondary | ICD-10-CM | POA: Diagnosis not present

## 2016-08-11 DIAGNOSIS — N2581 Secondary hyperparathyroidism of renal origin: Secondary | ICD-10-CM | POA: Diagnosis not present

## 2016-08-11 DIAGNOSIS — N186 End stage renal disease: Secondary | ICD-10-CM | POA: Diagnosis not present

## 2016-08-11 DIAGNOSIS — D509 Iron deficiency anemia, unspecified: Secondary | ICD-10-CM | POA: Diagnosis not present

## 2016-08-11 DIAGNOSIS — Z992 Dependence on renal dialysis: Secondary | ICD-10-CM | POA: Diagnosis not present

## 2016-08-11 DIAGNOSIS — D631 Anemia in chronic kidney disease: Secondary | ICD-10-CM | POA: Diagnosis not present

## 2016-08-13 DIAGNOSIS — Z992 Dependence on renal dialysis: Secondary | ICD-10-CM | POA: Diagnosis not present

## 2016-08-13 DIAGNOSIS — D631 Anemia in chronic kidney disease: Secondary | ICD-10-CM | POA: Diagnosis not present

## 2016-08-13 DIAGNOSIS — N186 End stage renal disease: Secondary | ICD-10-CM | POA: Diagnosis not present

## 2016-08-13 DIAGNOSIS — N2581 Secondary hyperparathyroidism of renal origin: Secondary | ICD-10-CM | POA: Diagnosis not present

## 2016-08-13 DIAGNOSIS — D509 Iron deficiency anemia, unspecified: Secondary | ICD-10-CM | POA: Diagnosis not present

## 2016-08-15 DIAGNOSIS — N2581 Secondary hyperparathyroidism of renal origin: Secondary | ICD-10-CM | POA: Diagnosis not present

## 2016-08-15 DIAGNOSIS — D509 Iron deficiency anemia, unspecified: Secondary | ICD-10-CM | POA: Diagnosis not present

## 2016-08-15 DIAGNOSIS — N186 End stage renal disease: Secondary | ICD-10-CM | POA: Diagnosis not present

## 2016-08-15 DIAGNOSIS — D631 Anemia in chronic kidney disease: Secondary | ICD-10-CM | POA: Diagnosis not present

## 2016-08-15 DIAGNOSIS — Z992 Dependence on renal dialysis: Secondary | ICD-10-CM | POA: Diagnosis not present

## 2016-08-18 DIAGNOSIS — N186 End stage renal disease: Secondary | ICD-10-CM | POA: Diagnosis not present

## 2016-08-18 DIAGNOSIS — Z992 Dependence on renal dialysis: Secondary | ICD-10-CM | POA: Diagnosis not present

## 2016-08-18 DIAGNOSIS — D631 Anemia in chronic kidney disease: Secondary | ICD-10-CM | POA: Diagnosis not present

## 2016-08-18 DIAGNOSIS — D509 Iron deficiency anemia, unspecified: Secondary | ICD-10-CM | POA: Diagnosis not present

## 2016-08-18 DIAGNOSIS — N2581 Secondary hyperparathyroidism of renal origin: Secondary | ICD-10-CM | POA: Diagnosis not present

## 2016-08-19 DIAGNOSIS — M5136 Other intervertebral disc degeneration, lumbar region: Secondary | ICD-10-CM | POA: Diagnosis not present

## 2016-08-19 DIAGNOSIS — M5416 Radiculopathy, lumbar region: Secondary | ICD-10-CM | POA: Diagnosis not present

## 2016-08-20 DIAGNOSIS — N186 End stage renal disease: Secondary | ICD-10-CM | POA: Diagnosis not present

## 2016-08-20 DIAGNOSIS — D631 Anemia in chronic kidney disease: Secondary | ICD-10-CM | POA: Diagnosis not present

## 2016-08-20 DIAGNOSIS — N2581 Secondary hyperparathyroidism of renal origin: Secondary | ICD-10-CM | POA: Diagnosis not present

## 2016-08-20 DIAGNOSIS — D509 Iron deficiency anemia, unspecified: Secondary | ICD-10-CM | POA: Diagnosis not present

## 2016-08-20 DIAGNOSIS — Z992 Dependence on renal dialysis: Secondary | ICD-10-CM | POA: Diagnosis not present

## 2016-08-22 DIAGNOSIS — D509 Iron deficiency anemia, unspecified: Secondary | ICD-10-CM | POA: Diagnosis not present

## 2016-08-22 DIAGNOSIS — Z992 Dependence on renal dialysis: Secondary | ICD-10-CM | POA: Diagnosis not present

## 2016-08-22 DIAGNOSIS — D631 Anemia in chronic kidney disease: Secondary | ICD-10-CM | POA: Diagnosis not present

## 2016-08-22 DIAGNOSIS — N2581 Secondary hyperparathyroidism of renal origin: Secondary | ICD-10-CM | POA: Diagnosis not present

## 2016-08-22 DIAGNOSIS — N186 End stage renal disease: Secondary | ICD-10-CM | POA: Diagnosis not present

## 2016-08-25 DIAGNOSIS — N186 End stage renal disease: Secondary | ICD-10-CM | POA: Diagnosis not present

## 2016-08-25 DIAGNOSIS — D509 Iron deficiency anemia, unspecified: Secondary | ICD-10-CM | POA: Diagnosis not present

## 2016-08-25 DIAGNOSIS — N2581 Secondary hyperparathyroidism of renal origin: Secondary | ICD-10-CM | POA: Diagnosis not present

## 2016-08-25 DIAGNOSIS — D631 Anemia in chronic kidney disease: Secondary | ICD-10-CM | POA: Diagnosis not present

## 2016-08-25 DIAGNOSIS — Z992 Dependence on renal dialysis: Secondary | ICD-10-CM | POA: Diagnosis not present

## 2016-08-27 DIAGNOSIS — N186 End stage renal disease: Secondary | ICD-10-CM | POA: Diagnosis not present

## 2016-08-27 DIAGNOSIS — N2581 Secondary hyperparathyroidism of renal origin: Secondary | ICD-10-CM | POA: Diagnosis not present

## 2016-08-27 DIAGNOSIS — D631 Anemia in chronic kidney disease: Secondary | ICD-10-CM | POA: Diagnosis not present

## 2016-08-27 DIAGNOSIS — D509 Iron deficiency anemia, unspecified: Secondary | ICD-10-CM | POA: Diagnosis not present

## 2016-08-27 DIAGNOSIS — Z992 Dependence on renal dialysis: Secondary | ICD-10-CM | POA: Diagnosis not present

## 2016-08-28 DIAGNOSIS — M5136 Other intervertebral disc degeneration, lumbar region: Secondary | ICD-10-CM | POA: Diagnosis not present

## 2016-08-28 DIAGNOSIS — M5416 Radiculopathy, lumbar region: Secondary | ICD-10-CM | POA: Diagnosis not present

## 2016-08-29 DIAGNOSIS — Z992 Dependence on renal dialysis: Secondary | ICD-10-CM | POA: Diagnosis not present

## 2016-08-29 DIAGNOSIS — N186 End stage renal disease: Secondary | ICD-10-CM | POA: Diagnosis not present

## 2016-08-29 DIAGNOSIS — D509 Iron deficiency anemia, unspecified: Secondary | ICD-10-CM | POA: Diagnosis not present

## 2016-08-29 DIAGNOSIS — D631 Anemia in chronic kidney disease: Secondary | ICD-10-CM | POA: Diagnosis not present

## 2016-08-29 DIAGNOSIS — N2581 Secondary hyperparathyroidism of renal origin: Secondary | ICD-10-CM | POA: Diagnosis not present

## 2016-09-01 DIAGNOSIS — Z992 Dependence on renal dialysis: Secondary | ICD-10-CM | POA: Diagnosis not present

## 2016-09-01 DIAGNOSIS — D509 Iron deficiency anemia, unspecified: Secondary | ICD-10-CM | POA: Diagnosis not present

## 2016-09-01 DIAGNOSIS — N186 End stage renal disease: Secondary | ICD-10-CM | POA: Diagnosis not present

## 2016-09-01 DIAGNOSIS — N2581 Secondary hyperparathyroidism of renal origin: Secondary | ICD-10-CM | POA: Diagnosis not present

## 2016-09-01 DIAGNOSIS — D631 Anemia in chronic kidney disease: Secondary | ICD-10-CM | POA: Diagnosis not present

## 2016-09-02 ENCOUNTER — Ambulatory Visit: Payer: Medicare Other | Admitting: Family

## 2016-09-03 ENCOUNTER — Telehealth: Payer: Self-pay | Admitting: *Deleted

## 2016-09-03 DIAGNOSIS — N2581 Secondary hyperparathyroidism of renal origin: Secondary | ICD-10-CM | POA: Diagnosis not present

## 2016-09-03 DIAGNOSIS — D509 Iron deficiency anemia, unspecified: Secondary | ICD-10-CM | POA: Diagnosis not present

## 2016-09-03 DIAGNOSIS — Z992 Dependence on renal dialysis: Secondary | ICD-10-CM | POA: Diagnosis not present

## 2016-09-03 DIAGNOSIS — N186 End stage renal disease: Secondary | ICD-10-CM | POA: Diagnosis not present

## 2016-09-03 DIAGNOSIS — D631 Anemia in chronic kidney disease: Secondary | ICD-10-CM | POA: Diagnosis not present

## 2016-09-03 NOTE — Telephone Encounter (Signed)
Moshe Cipro from [facility] has requested office notes in reference to patients oxygen use specifically the months of August - December of 2017. Fax [phone] Contact  Moshe Cipro [phone]

## 2016-09-04 ENCOUNTER — Encounter: Payer: Self-pay | Admitting: Family

## 2016-09-04 ENCOUNTER — Ambulatory Visit: Payer: Medicare Other | Attending: Family | Admitting: Family

## 2016-09-04 VITALS — BP 156/62 | HR 67 | Resp 18 | Ht 67.0 in | Wt 171.1 lb

## 2016-09-04 DIAGNOSIS — I5032 Chronic diastolic (congestive) heart failure: Secondary | ICD-10-CM | POA: Diagnosis not present

## 2016-09-04 DIAGNOSIS — D638 Anemia in other chronic diseases classified elsewhere: Secondary | ICD-10-CM | POA: Diagnosis not present

## 2016-09-04 DIAGNOSIS — Z955 Presence of coronary angioplasty implant and graft: Secondary | ICD-10-CM | POA: Diagnosis not present

## 2016-09-04 DIAGNOSIS — J449 Chronic obstructive pulmonary disease, unspecified: Secondary | ICD-10-CM | POA: Diagnosis not present

## 2016-09-04 DIAGNOSIS — Z833 Family history of diabetes mellitus: Secondary | ICD-10-CM | POA: Diagnosis not present

## 2016-09-04 DIAGNOSIS — I214 Non-ST elevation (NSTEMI) myocardial infarction: Secondary | ICD-10-CM | POA: Insufficient documentation

## 2016-09-04 DIAGNOSIS — D573 Sickle-cell trait: Secondary | ICD-10-CM | POA: Insufficient documentation

## 2016-09-04 DIAGNOSIS — E11319 Type 2 diabetes mellitus with unspecified diabetic retinopathy without macular edema: Secondary | ICD-10-CM | POA: Insufficient documentation

## 2016-09-04 DIAGNOSIS — E1122 Type 2 diabetes mellitus with diabetic chronic kidney disease: Secondary | ICD-10-CM | POA: Insufficient documentation

## 2016-09-04 DIAGNOSIS — I2510 Atherosclerotic heart disease of native coronary artery without angina pectoris: Secondary | ICD-10-CM | POA: Insufficient documentation

## 2016-09-04 DIAGNOSIS — E785 Hyperlipidemia, unspecified: Secondary | ICD-10-CM | POA: Diagnosis not present

## 2016-09-04 DIAGNOSIS — I132 Hypertensive heart and chronic kidney disease with heart failure and with stage 5 chronic kidney disease, or end stage renal disease: Secondary | ICD-10-CM | POA: Insufficient documentation

## 2016-09-04 DIAGNOSIS — N186 End stage renal disease: Secondary | ICD-10-CM | POA: Diagnosis not present

## 2016-09-04 DIAGNOSIS — I509 Heart failure, unspecified: Secondary | ICD-10-CM | POA: Diagnosis present

## 2016-09-04 DIAGNOSIS — I739 Peripheral vascular disease, unspecified: Secondary | ICD-10-CM | POA: Diagnosis not present

## 2016-09-04 DIAGNOSIS — Z992 Dependence on renal dialysis: Secondary | ICD-10-CM | POA: Diagnosis not present

## 2016-09-04 DIAGNOSIS — E1140 Type 2 diabetes mellitus with diabetic neuropathy, unspecified: Secondary | ICD-10-CM | POA: Diagnosis not present

## 2016-09-04 DIAGNOSIS — Z8249 Family history of ischemic heart disease and other diseases of the circulatory system: Secondary | ICD-10-CM | POA: Diagnosis not present

## 2016-09-04 DIAGNOSIS — Z87891 Personal history of nicotine dependence: Secondary | ICD-10-CM | POA: Diagnosis not present

## 2016-09-04 DIAGNOSIS — I10 Essential (primary) hypertension: Secondary | ICD-10-CM

## 2016-09-04 NOTE — Progress Notes (Signed)
Patient ID: Theresa [name], female    DOB: 01-Nov-1955, 61 y.o.   MRN: [id number]  HPI Theresa [name] is a 61 y/o female with a history of PVD, HTN, hyperlipidemia, COPD, CAD with NSTEMI, anemia, previous tobacco use, DM, ESRD on dialysis and chronic heart failure.  Last echo was done 02/26/16 which showed an EF of 55-60%, mild AS and moderate/severe MR which is worse than previous echo done on 11/24/14. Right-sided pressure elevated at 50 mm Hg.   Had colonoscopy on 07/08/2016 for transplant evaluation. Colonoscopy showed polyps that were removed and is to get another colonoscopy next January. Has family hx of colon ca. Last admitted on 05/21/16 with chest pain/near syncope. Ruled out for TIA/CVA. Head CT and MRI were negative. Carotid dopplers showed right & left ICA 50-69% stenosis. EEG normal.  Cardiology consult obtained. Isosorbide was added. Previously admitted on 02/22/16 with chest pain. Troponins were negative X 3. Cardiology consult was done. Was also diagnosed with pneumonia and treated with levaquin. Losartan was started and metoprolol increased due to HTN.  Patient presents today for a follow-up visit with no shortness of breath. No swelling in her ankles/abdomen. Weighing daily with consistent weights. Continuing dialysis on M, W, F. Unable to tolerate wearing her CPAP due to claustrophobia sensation. Does wear oxygen at 2L as needed upon exertion typically when she gets a bad cold. Pt is O+ making transplant difficult, has been on transpant list for ~4 years.  Past Medical History:  Diagnosis Date  . Anemia of chronic disease   . Carotid arterial disease ([place])    a. 02/2013 U/S: 40-59% bilat ICA stenosis - *f/u 02/2014*  . Chronic constipation   . Chronic diastolic CHF (congestive heart failure) ([place])    a. 10/2013 Echo [facility]): EF 55-60%, mod conc LVH, mod MR, mildly dil LA, mild Ao sclerosis w/o stenosis.  . Colon polyps   . COPD (chronic obstructive pulmonary disease) ([place])   . Coronary  artery disease    a. 05/2013 NSTEMI/PCI: LM 20d, LAD min irregs, LCX small, nl, OM1 nl, RCA dom 46m (2.5x16 Promus DES), PDA1 80p.  . Diabetes mellitus   . Diabetic neuropathy ([place])   . Diabetic retinopathy ([place]) 05/28/2013   Hx bilat retinal detachment, proliferative diab retinopathy and bilat vitreous hemorrhage   . Emphysema   . ESRD on hemodialysis ([place])    a. [facility] in [place], [place], on a TTS schedule.  She started dialysis in Feb 2014.  Etiology of renal failure not known, likely diabetes.  Has a left upper arm AV graft.  . History of bronchitis    Mar 2012  . History of pneumonia    June 2012  . History of tobacco abuse    a. Quit 2012.  Marland Kitchen Hyperlipidemia   . Hypertension   . Moderate mitral insufficiency    a. 10/2013 Echo: EF 55-60%, mod MR.  . Peripheral vascular disease ([place])   . Sickle cell trait [facility])    Past Surgical History:  Procedure Laterality Date  . ABDOMINAL HYSTERECTOMY     2000  . CARDIAC CATHETERIZATION    . CARDIAC CATHETERIZATION N/A 05/02/2015   Procedure: Left Heart Cath and Coronary Angiography;  Surgeon: [name], MD;  Location: [place] CV LAB;  Service: Cardiovascular;  Laterality: N/A;  . COLONOSCOPY WITH PROPOFOL N/A 07/08/2016   Procedure: COLONOSCOPY WITH PROPOFOL;  Surgeon: [name], MD;  Location: [facility] ENDOSCOPY;  Service: Endoscopy;  Laterality: N/A;  . colonscopy    .  CORONARY ANGIOPLASTY  05/28/2014   stent placement to the mid RCA  . DILATION AND CURETTAGE OF UTERUS     several in the early 80's  . ESOPHAGOGASTRODUODENOSCOPY     2012  . EYE SURGERY     bilateral laser 2012  . EYE SURGERY     right  . GAS INSERTION  09/30/2011   Procedure: INSERTION OF GAS;  Surgeon: [name], MD;  Location: [place];  Service: Ophthalmology;  Laterality: Right;  C3F8  . GAS/FLUID EXCHANGE  09/30/2011   Procedure: GAS/FLUID EXCHANGE;  Surgeon: [name], MD;  Location: [place];  Service: Ophthalmology;  Laterality: Right;  .  LEFT HEART CATHETERIZATION WITH CORONARY ANGIOGRAM N/A 05/28/2013   Procedure: LEFT HEART CATHETERIZATION WITH CORONARY ANGIOGRAM;  Surgeon: [name], MD;  Location: [facility] CATH LAB;  Service: Cardiovascular;  Laterality: N/A;  . PARS PLANA VITRECTOMY  04/22/2011   Procedure: PARS PLANA VITRECTOMY WITH 25 GAUGE;  Surgeon: [name], MD;  Location: [place];  Service: Ophthalmology;  Laterality: Left;  membrane peel, endolaser, gas fluid exchange, silicone oil, repair of complex traction retinal detachment  . PARS PLANA VITRECTOMY  09/30/2011   Procedure: PARS PLANA VITRECTOMY WITH 25 GAUGE;  Surgeon: [name], MD;  Location: [place];  Service: Ophthalmology;  Laterality: Right;  Endolaser; Repair of Complex Traction Retinal Detachment  . PARS PLANA VITRECTOMY  02/24/2012   Procedure: PARS PLANA VITRECTOMY WITH 25 GAUGE;  Surgeon: [name], MD;  Location: [place];  Service: Ophthalmology;  Laterality: Left;  . PTCA    . SILICON OIL REMOVAL  5/39/7673   Procedure: SILICON OIL REMOVAL;  Surgeon: [name], MD;  Location: [place];  Service: Ophthalmology;  Laterality: Left;  . THROMBECTOMY / ARTERIOVENOUS GRAFT REVISION    . TUBAL LIGATION     1979   Family History  Problem Relation Age of Onset  . Stroke Mother   . Heart attack Mother   . Heart disease Mother   . Colon cancer Father   . Colon cancer Sister   . Heart attack Brother   . Stroke Brother   . Diabetes Brother   . Breast cancer Sister   . Diabetes Brother   . Diabetes Daughter   . Anesthesia problems Neg Hx   . Hypotension Neg Hx   . Malignant hyperthermia Neg Hx   . Pseudochol deficiency Neg Hx    Social History  Substance Use Topics  . Smoking status: Former Smoker    Packs/day: 1.00    Years: 25.00    Quit date: 06/09/2010  . Smokeless tobacco: Never Used  . Alcohol use No   Allergies  Allergen Reactions  . Hydrocodone Hives  . Metformin Diarrhea   Prior to Admission medications    Medication Sig Start Date End Date Taking? Authorizing Provider  acetaminophen (TYLENOL) 325 MG tablet Take 2 tablets (650 mg total) by mouth every 4 (four) hours as needed for headache or mild pain. 05/24/16  Yes Nicholes Mango, MD  albuterol (PROVENTIL HFA;VENTOLIN HFA) 108 (90 BASE) MCG/ACT inhaler Inhale 2 puffs into the lungs every 6 (six) hours as needed for wheezing or shortness of breath.   Yes Historical Provider, MD  albuterol (PROVENTIL) (2.5 MG/3ML) 0.083% nebulizer solution Take 2.5 mg by nebulization every 4 (four) hours as needed for wheezing or shortness of breath.   Yes Historical Provider, MD  aspirin EC 81 MG tablet Take 81 mg by mouth daily.  Yes Historical Provider, MD  atorvastatin (LIPITOR) 40 MG tablet Take 1 tablet (40 mg total) by mouth daily. Patient taking differently: Take 40 mg by mouth daily at 6 PM.  07/03/16 10/01/16 Yes Alisa Graff, FNP  calcium acetate (PHOSLO) 667 MG capsule Take [phone] mg by mouth 3 (three) times daily with meals. Take 5 capsules if you have anything with cheese, otherwise, take 4 capsules if you don't have cheese.   Yes Historical Provider, MD  carvedilol (COREG) 25 MG tablet Take 1 tablet (25 mg total) by mouth 2 (two) times daily with a meal. Patient taking differently: Take 12.5 mg by mouth 2 (two) times daily with a meal. Take one tablet by mouth twice daily. If blood pressure is greater than 160 take two tablets by mouth twice daily. 02/23/16  Yes Bettey Costa, MD  cloNIDine (CATAPRES) 0.1 MG tablet Take 0.1 mg by mouth 2 (two) times daily.   Yes Historical Provider, MD  furosemide (LASIX) 80 MG tablet Take 80 mg by mouth daily.    Yes Historical Provider, MD  isosorbide mononitrate (IMDUR) 60 MG 24 hr tablet Take 1 tablet (60 mg total) by mouth daily. 07/03/16 10/01/16 Yes Alisa Graff, FNP  losartan (COZAAR) 50 MG tablet Take 1 tablet (50 mg total) by mouth daily. 03/24/16  Yes Alisa Graff, FNP  Melatonin 1 MG CAPS Take 10 mg by mouth  daily.    Yes Historical Provider, MD  multivitamin (RENA-VIT) TABS tablet Take 1 tablet by mouth daily. 03/24/16  Yes Alisa Graff, FNP  naproxen sodium (ANAPROX) 220 MG tablet Take 220 mg by mouth daily as needed.   Yes Historical Provider, MD  nitroGLYCERIN (NITROSTAT) 0.4 MG SL tablet Place 1 tablet (0.4 mg total) under the tongue every 5 (five) minutes as needed for chest pain. 09/26/15  Yes Alisa Graff, FNP  oxyCODONE-acetaminophen (PERCOCET/ROXICET) 5-325 MG tablet Take 1 tablet by mouth daily as needed for severe pain.    Yes Historical Provider, MD  pregabalin (LYRICA) 75 MG capsule Take 1 capsule (75 mg total) by mouth daily. 04/21/16  Yes Burnard Hawthorne, FNP    Review of Systems  Constitutional: Negative for appetite change and fatigue.  HENT: Positive for congestion and postnasal drip.   Eyes: Negative.   Respiratory: Negative for chest tightness, shortness of breath and wheezing.   Cardiovascular: Negative for chest pain, palpitations and leg swelling.  Gastrointestinal: Positive for nausea and vomiting. Negative for abdominal distention.       X2 days  Endocrine: Negative.   Genitourinary: Negative.   Musculoskeletal: Positive for back pain. Negative for neck pain.  Skin: Negative.   Allergic/Immunologic: Negative.   Neurological: Negative for weakness, light-headedness and headaches.  Hematological: Does not bruise/bleed easily.  Psychiatric/Behavioral: Negative for dysphoric mood and sleep disturbance. The patient is not nervous/anxious.        Using melatonin at bedtime    Physical Exam  Constitutional: She is oriented to person, place, and time. She appears well-developed and well-nourished.  HENT:  Head: Normocephalic and atraumatic.  Eyes: Conjunctivae are normal. Pupils are equal, round, and reactive to light.  Neck: Normal range of motion. Neck supple. No JVD present.  Cardiovascular: Normal rate and regular rhythm.   Pulmonary/Chest: Effort normal. She  has no wheezes. She has no rales.  Abdominal: Soft. She exhibits no distension. There is no tenderness.  Musculoskeletal: She exhibits no edema or tenderness.  Neurological: She is alert and oriented to person,  place, and time.  Skin: Skin is warm and dry.  Psychiatric: She has a normal mood and affect. Her behavior is normal. Thought content normal.  Nursing note and vitals reviewed.   Vitals:   09/04/16 0939  BP: (!) 156/62  Pulse: 67  Resp: 18  SpO2: 95%  Weight: 171 lb 2 oz (77.6 kg)  Height: 5\' 7"  (1.702 m)   Wt Readings from Last 3 Encounters:  09/04/16 171 lb 2 oz (77.6 kg)  07/08/16 170 lb (77.1 kg)  07/03/16 170 lb 2 oz (77.2 kg)     Assessment & Plan:  1: Chronic heart failure with preserved ejection fraction- - NYHA class I  - euvolemic  - continue daily weights and call for an overnight weight gain of >2 pounds/weekly weight gain of >5 pounds. Weight has been consistent - tries not to salt foods; has been using salt substitute, when eats canned vegetables will thoroughly rinse them, recommended <2000 mg Na/daily - doesn't typically get short of breath, walks ~30 minutes every day and uses cane because of occasional hip pain - Last saw cardiology Rockey Situ) 02/05/16. Needs f/u appointment scheduled  2: HTN- - BP elevated 156/62 but was n/v on the way here, recommended eating food/snack with medications - Checks bp at home 3x a week typically sees ~130/60 - Still asking about why shes on the isosorbide mononitrate, encouraged to continue as her bp is better today than recent visits  3: COPD- - Unable to tolerate CPAP - oxygen at 2L as needed, using only when has a bad cold  4: ESRD on hemodialysis- - dialysis on M, W, F - denies difficulty with dialysis - getting worked up for transplant  Medication bottles were reviewed. Return here in 6 months or sooner for any questions/problems before then.   Darrow Bussing, PharmD Pharmacy Resident 09/04/2016 10:57 AM

## 2016-09-04 NOTE — Patient Instructions (Signed)
Continue weighing daily and call for an overnight weight gain of > 2 pounds or a weekly weight gain of >5 pounds. 

## 2016-09-05 DIAGNOSIS — D509 Iron deficiency anemia, unspecified: Secondary | ICD-10-CM | POA: Diagnosis not present

## 2016-09-05 DIAGNOSIS — N186 End stage renal disease: Secondary | ICD-10-CM | POA: Diagnosis not present

## 2016-09-05 DIAGNOSIS — Z992 Dependence on renal dialysis: Secondary | ICD-10-CM | POA: Diagnosis not present

## 2016-09-05 DIAGNOSIS — D631 Anemia in chronic kidney disease: Secondary | ICD-10-CM | POA: Diagnosis not present

## 2016-09-05 DIAGNOSIS — N2581 Secondary hyperparathyroidism of renal origin: Secondary | ICD-10-CM | POA: Diagnosis not present

## 2016-09-06 DIAGNOSIS — Z992 Dependence on renal dialysis: Secondary | ICD-10-CM | POA: Diagnosis not present

## 2016-09-06 DIAGNOSIS — N186 End stage renal disease: Secondary | ICD-10-CM | POA: Diagnosis not present

## 2016-09-08 DIAGNOSIS — N2581 Secondary hyperparathyroidism of renal origin: Secondary | ICD-10-CM | POA: Diagnosis not present

## 2016-09-08 DIAGNOSIS — N186 End stage renal disease: Secondary | ICD-10-CM | POA: Diagnosis not present

## 2016-09-08 DIAGNOSIS — Z992 Dependence on renal dialysis: Secondary | ICD-10-CM | POA: Diagnosis not present

## 2016-09-08 DIAGNOSIS — D509 Iron deficiency anemia, unspecified: Secondary | ICD-10-CM | POA: Diagnosis not present

## 2016-09-09 NOTE — Telephone Encounter (Signed)
All Notes have been faxed.

## 2016-09-10 DIAGNOSIS — N186 End stage renal disease: Secondary | ICD-10-CM | POA: Diagnosis not present

## 2016-09-10 DIAGNOSIS — N2581 Secondary hyperparathyroidism of renal origin: Secondary | ICD-10-CM | POA: Diagnosis not present

## 2016-09-10 DIAGNOSIS — Z992 Dependence on renal dialysis: Secondary | ICD-10-CM | POA: Diagnosis not present

## 2016-09-10 DIAGNOSIS — D509 Iron deficiency anemia, unspecified: Secondary | ICD-10-CM | POA: Diagnosis not present

## 2016-09-12 DIAGNOSIS — Z992 Dependence on renal dialysis: Secondary | ICD-10-CM | POA: Diagnosis not present

## 2016-09-12 DIAGNOSIS — N2581 Secondary hyperparathyroidism of renal origin: Secondary | ICD-10-CM | POA: Diagnosis not present

## 2016-09-12 DIAGNOSIS — D509 Iron deficiency anemia, unspecified: Secondary | ICD-10-CM | POA: Diagnosis not present

## 2016-09-12 DIAGNOSIS — N186 End stage renal disease: Secondary | ICD-10-CM | POA: Diagnosis not present

## 2016-09-15 DIAGNOSIS — D509 Iron deficiency anemia, unspecified: Secondary | ICD-10-CM | POA: Diagnosis not present

## 2016-09-15 DIAGNOSIS — Z992 Dependence on renal dialysis: Secondary | ICD-10-CM | POA: Diagnosis not present

## 2016-09-15 DIAGNOSIS — N186 End stage renal disease: Secondary | ICD-10-CM | POA: Diagnosis not present

## 2016-09-15 DIAGNOSIS — N2581 Secondary hyperparathyroidism of renal origin: Secondary | ICD-10-CM | POA: Diagnosis not present

## 2016-09-17 DIAGNOSIS — D509 Iron deficiency anemia, unspecified: Secondary | ICD-10-CM | POA: Diagnosis not present

## 2016-09-17 DIAGNOSIS — N186 End stage renal disease: Secondary | ICD-10-CM | POA: Diagnosis not present

## 2016-09-17 DIAGNOSIS — N2581 Secondary hyperparathyroidism of renal origin: Secondary | ICD-10-CM | POA: Diagnosis not present

## 2016-09-17 DIAGNOSIS — Z992 Dependence on renal dialysis: Secondary | ICD-10-CM | POA: Diagnosis not present

## 2016-09-19 DIAGNOSIS — Z992 Dependence on renal dialysis: Secondary | ICD-10-CM | POA: Diagnosis not present

## 2016-09-19 DIAGNOSIS — D509 Iron deficiency anemia, unspecified: Secondary | ICD-10-CM | POA: Diagnosis not present

## 2016-09-19 DIAGNOSIS — N2581 Secondary hyperparathyroidism of renal origin: Secondary | ICD-10-CM | POA: Diagnosis not present

## 2016-09-19 DIAGNOSIS — N186 End stage renal disease: Secondary | ICD-10-CM | POA: Diagnosis not present

## 2016-09-22 DIAGNOSIS — N2581 Secondary hyperparathyroidism of renal origin: Secondary | ICD-10-CM | POA: Diagnosis not present

## 2016-09-22 DIAGNOSIS — Z992 Dependence on renal dialysis: Secondary | ICD-10-CM | POA: Diagnosis not present

## 2016-09-22 DIAGNOSIS — N186 End stage renal disease: Secondary | ICD-10-CM | POA: Diagnosis not present

## 2016-09-22 DIAGNOSIS — D509 Iron deficiency anemia, unspecified: Secondary | ICD-10-CM | POA: Diagnosis not present

## 2016-09-24 DIAGNOSIS — Z992 Dependence on renal dialysis: Secondary | ICD-10-CM | POA: Diagnosis not present

## 2016-09-24 DIAGNOSIS — N186 End stage renal disease: Secondary | ICD-10-CM | POA: Diagnosis not present

## 2016-09-24 DIAGNOSIS — D509 Iron deficiency anemia, unspecified: Secondary | ICD-10-CM | POA: Diagnosis not present

## 2016-09-24 DIAGNOSIS — N2581 Secondary hyperparathyroidism of renal origin: Secondary | ICD-10-CM | POA: Diagnosis not present

## 2016-09-26 DIAGNOSIS — Z992 Dependence on renal dialysis: Secondary | ICD-10-CM | POA: Diagnosis not present

## 2016-09-26 DIAGNOSIS — N186 End stage renal disease: Secondary | ICD-10-CM | POA: Diagnosis not present

## 2016-09-26 DIAGNOSIS — N2581 Secondary hyperparathyroidism of renal origin: Secondary | ICD-10-CM | POA: Diagnosis not present

## 2016-09-26 DIAGNOSIS — D509 Iron deficiency anemia, unspecified: Secondary | ICD-10-CM | POA: Diagnosis not present

## 2016-09-29 DIAGNOSIS — D509 Iron deficiency anemia, unspecified: Secondary | ICD-10-CM | POA: Diagnosis not present

## 2016-09-29 DIAGNOSIS — N186 End stage renal disease: Secondary | ICD-10-CM | POA: Diagnosis not present

## 2016-09-29 DIAGNOSIS — N2581 Secondary hyperparathyroidism of renal origin: Secondary | ICD-10-CM | POA: Diagnosis not present

## 2016-09-29 DIAGNOSIS — Z992 Dependence on renal dialysis: Secondary | ICD-10-CM | POA: Diagnosis not present

## 2016-09-30 ENCOUNTER — Ambulatory Visit: Payer: Medicare Other | Admitting: Podiatry

## 2016-10-01 DIAGNOSIS — D509 Iron deficiency anemia, unspecified: Secondary | ICD-10-CM | POA: Diagnosis not present

## 2016-10-01 DIAGNOSIS — N186 End stage renal disease: Secondary | ICD-10-CM | POA: Diagnosis not present

## 2016-10-01 DIAGNOSIS — N2581 Secondary hyperparathyroidism of renal origin: Secondary | ICD-10-CM | POA: Diagnosis not present

## 2016-10-01 DIAGNOSIS — Z992 Dependence on renal dialysis: Secondary | ICD-10-CM | POA: Diagnosis not present

## 2016-10-03 DIAGNOSIS — N2581 Secondary hyperparathyroidism of renal origin: Secondary | ICD-10-CM | POA: Diagnosis not present

## 2016-10-03 DIAGNOSIS — N186 End stage renal disease: Secondary | ICD-10-CM | POA: Diagnosis not present

## 2016-10-03 DIAGNOSIS — D509 Iron deficiency anemia, unspecified: Secondary | ICD-10-CM | POA: Diagnosis not present

## 2016-10-03 DIAGNOSIS — Z992 Dependence on renal dialysis: Secondary | ICD-10-CM | POA: Diagnosis not present

## 2016-10-06 DIAGNOSIS — N186 End stage renal disease: Secondary | ICD-10-CM | POA: Diagnosis not present

## 2016-10-06 DIAGNOSIS — N2581 Secondary hyperparathyroidism of renal origin: Secondary | ICD-10-CM | POA: Diagnosis not present

## 2016-10-06 DIAGNOSIS — Z992 Dependence on renal dialysis: Secondary | ICD-10-CM | POA: Diagnosis not present

## 2016-10-06 DIAGNOSIS — D509 Iron deficiency anemia, unspecified: Secondary | ICD-10-CM | POA: Diagnosis not present

## 2016-10-08 DIAGNOSIS — D631 Anemia in chronic kidney disease: Secondary | ICD-10-CM | POA: Diagnosis not present

## 2016-10-08 DIAGNOSIS — Z992 Dependence on renal dialysis: Secondary | ICD-10-CM | POA: Diagnosis not present

## 2016-10-08 DIAGNOSIS — N186 End stage renal disease: Secondary | ICD-10-CM | POA: Diagnosis not present

## 2016-10-08 DIAGNOSIS — N2581 Secondary hyperparathyroidism of renal origin: Secondary | ICD-10-CM | POA: Diagnosis not present

## 2016-10-08 DIAGNOSIS — D509 Iron deficiency anemia, unspecified: Secondary | ICD-10-CM | POA: Diagnosis not present

## 2016-10-10 DIAGNOSIS — N186 End stage renal disease: Secondary | ICD-10-CM | POA: Diagnosis not present

## 2016-10-10 DIAGNOSIS — D631 Anemia in chronic kidney disease: Secondary | ICD-10-CM | POA: Diagnosis not present

## 2016-10-10 DIAGNOSIS — Z992 Dependence on renal dialysis: Secondary | ICD-10-CM | POA: Diagnosis not present

## 2016-10-10 DIAGNOSIS — N2581 Secondary hyperparathyroidism of renal origin: Secondary | ICD-10-CM | POA: Diagnosis not present

## 2016-10-10 DIAGNOSIS — D509 Iron deficiency anemia, unspecified: Secondary | ICD-10-CM | POA: Diagnosis not present

## 2016-10-13 DIAGNOSIS — N186 End stage renal disease: Secondary | ICD-10-CM | POA: Diagnosis not present

## 2016-10-13 DIAGNOSIS — D631 Anemia in chronic kidney disease: Secondary | ICD-10-CM | POA: Diagnosis not present

## 2016-10-13 DIAGNOSIS — Z992 Dependence on renal dialysis: Secondary | ICD-10-CM | POA: Diagnosis not present

## 2016-10-13 DIAGNOSIS — N2581 Secondary hyperparathyroidism of renal origin: Secondary | ICD-10-CM | POA: Diagnosis not present

## 2016-10-13 DIAGNOSIS — D509 Iron deficiency anemia, unspecified: Secondary | ICD-10-CM | POA: Diagnosis not present

## 2016-10-15 DIAGNOSIS — Z992 Dependence on renal dialysis: Secondary | ICD-10-CM | POA: Diagnosis not present

## 2016-10-15 DIAGNOSIS — N2581 Secondary hyperparathyroidism of renal origin: Secondary | ICD-10-CM | POA: Diagnosis not present

## 2016-10-15 DIAGNOSIS — N186 End stage renal disease: Secondary | ICD-10-CM | POA: Diagnosis not present

## 2016-10-15 DIAGNOSIS — D509 Iron deficiency anemia, unspecified: Secondary | ICD-10-CM | POA: Diagnosis not present

## 2016-10-15 DIAGNOSIS — D631 Anemia in chronic kidney disease: Secondary | ICD-10-CM | POA: Diagnosis not present

## 2016-10-16 ENCOUNTER — Ambulatory Visit (INDEPENDENT_AMBULATORY_CARE_PROVIDER_SITE_OTHER): Payer: Medicare Other | Admitting: Family

## 2016-10-16 ENCOUNTER — Encounter: Payer: Self-pay | Admitting: Family

## 2016-10-16 VITALS — BP 142/58 | HR 75 | Temp 98.5°F | Ht 67.0 in | Wt 170.8 lb

## 2016-10-16 DIAGNOSIS — N186 End stage renal disease: Secondary | ICD-10-CM

## 2016-10-16 DIAGNOSIS — I10 Essential (primary) hypertension: Secondary | ICD-10-CM | POA: Diagnosis not present

## 2016-10-16 DIAGNOSIS — Z1231 Encounter for screening mammogram for malignant neoplasm of breast: Secondary | ICD-10-CM | POA: Diagnosis not present

## 2016-10-16 DIAGNOSIS — J449 Chronic obstructive pulmonary disease, unspecified: Secondary | ICD-10-CM | POA: Diagnosis not present

## 2016-10-16 DIAGNOSIS — I2510 Atherosclerotic heart disease of native coronary artery without angina pectoris: Secondary | ICD-10-CM | POA: Diagnosis not present

## 2016-10-16 DIAGNOSIS — I5032 Chronic diastolic (congestive) heart failure: Secondary | ICD-10-CM | POA: Diagnosis not present

## 2016-10-16 DIAGNOSIS — Z992 Dependence on renal dialysis: Secondary | ICD-10-CM

## 2016-10-16 DIAGNOSIS — Z1239 Encounter for other screening for malignant neoplasm of breast: Secondary | ICD-10-CM

## 2016-10-16 NOTE — Assessment & Plan Note (Signed)
Ordered.  Patient will schedule. 

## 2016-10-16 NOTE — Assessment & Plan Note (Signed)
Weight stable. NO adventitious lung sounds. Will follow with [name].

## 2016-10-16 NOTE — Patient Instructions (Addendum)
Please make sure you follow up with [name] since no longer following with heart failture clinic.   Follow up for pap smear.   We placed a referral. Mammogram this year. I asked that you call one the below locations and schedule this when it is convenient for you.   If you have dense breasts, you may ask for 3D mammogram over the traditional 2D mammogram as new evidence suggest 3D is superior. Please note that NOT all insurance companies cover 3D and you may have to pay a higher copay. You may call your insurance company to further clarify your benefits.   Options for [place]  [place], [place]  * [facility] [place] * Note if you ask for 3D mammogram at this location, you must request [place], [place] location*     Pleasure seeing you and happy you are doing so well!

## 2016-10-16 NOTE — Progress Notes (Signed)
Pre visit review using our clinic review tool, if applicable. No additional management support is needed unless otherwise documented below in the visit note. 

## 2016-10-16 NOTE — Assessment & Plan Note (Signed)
Stable. continues to follow with lateef. On kidney transplant list.

## 2016-10-16 NOTE — Progress Notes (Signed)
Subjective:    Patient ID: Theresa [name], female    DOB: Jun 22, 1955, 61 y.o.   MRN: [id number]  CC: THEKLA COLBORN is a 61 y.o. female who presents today for follow up.   HPI: DM- checked at ESRD and per patient 08/2016 aic 5.6%. Not taking any medication for DM.   HTN- compliant with medication however hasn't taken them yet this morning. Takes 80mg  lasix QOD. Doesn't take on ERSD days. No dizziness, lightheadedness.  No longer goes to heart failure clinic per patient; still follows with Rockey Situ   Denies exertional chest pain or pressure, numbness or tingling radiating to left arm or jaw, palpitations, dizziness, frequent headaches, changes in vision, or shortness of breath.    COPD - no exacerbation per patient in over a year. Has oxygen at home.Not wearing o2 at home. Last wore 02 for couple days in 05/2016 for 'cold. No cough, wheezing, sob.       HISTORY:  Past Medical History:  Diagnosis Date  . Anemia of chronic disease   . Carotid arterial disease ([place])    a. 02/2013 U/S: 40-59% bilat ICA stenosis - *f/u 02/2014*  . Chronic constipation   . Chronic diastolic CHF (congestive heart failure) ([place])    a. 10/2013 Echo [facility]): EF 55-60%, mod conc LVH, mod MR, mildly dil LA, mild Ao sclerosis w/o stenosis.  . Colon polyps   . COPD (chronic obstructive pulmonary disease) ([place])   . Coronary artery disease    a. 05/2013 NSTEMI/PCI: LM 20d, LAD min irregs, LCX small, nl, OM1 nl, RCA dom 72m (2.5x16 Promus DES), PDA1 80p.  . Diabetes mellitus   . Diabetic neuropathy ([place])   . Diabetic retinopathy ([place]) 05/28/2013   Hx bilat retinal detachment, proliferative diab retinopathy and bilat vitreous hemorrhage   . Emphysema   . ESRD on hemodialysis ([place])    a. [facility] in [place], [place], on a TTS schedule.  She started dialysis in Feb 2014.  Etiology of renal failure not known, likely diabetes.  Has a left upper arm AV graft.  . History of bronchitis    Mar 2012  . History of pneumonia    June 2012  . History of tobacco abuse    a. Quit 2012.  Marland Kitchen Hyperlipidemia   . Hypertension   . Moderate mitral insufficiency    a. 10/2013 Echo: EF 55-60%, mod MR.  . Myocardial infarct ([place]) 05/2013  . Peripheral vascular disease ([place])   . Sickle cell trait [facility])    Past Surgical History:  Procedure Laterality Date  . ABDOMINAL HYSTERECTOMY     2000  . CARDIAC CATHETERIZATION    . CARDIAC CATHETERIZATION N/A 05/02/2015   Procedure: Left Heart Cath and Coronary Angiography;  Surgeon: [name], MD;  Location: [place] CV LAB;  Service: Cardiovascular;  Laterality: N/A;  . COLONOSCOPY WITH PROPOFOL N/A 07/08/2016   Procedure: COLONOSCOPY WITH PROPOFOL;  Surgeon: [name], MD;  Location: [facility] ENDOSCOPY;  Service: Endoscopy;  Laterality: N/A;  . colonscopy    . CORONARY ANGIOPLASTY  05/28/2014   stent placement to the mid RCA  . DILATION AND CURETTAGE OF UTERUS     several in the early 80's  . ESOPHAGOGASTRODUODENOSCOPY     2012  . EYE SURGERY     bilateral laser 2012  . EYE SURGERY     right  . GAS INSERTION  09/30/2011   Procedure: INSERTION OF GAS;  Surgeon: [name], MD;  Location: [place];  Service: Ophthalmology;  Laterality: Right;  [id number]  . GAS/FLUID EXCHANGE  09/30/2011   Procedure: GAS/FLUID EXCHANGE;  Surgeon: [name], MD;  Location: [place];  Service: Ophthalmology;  Laterality: Right;  . LEFT HEART CATHETERIZATION WITH CORONARY ANGIOGRAM N/A 05/28/2013   Procedure: LEFT HEART CATHETERIZATION WITH CORONARY ANGIOGRAM;  Surgeon: [name], MD;  Location: [facility] CATH LAB;  Service: Cardiovascular;  Laterality: N/A;  . PARS PLANA VITRECTOMY  04/22/2011   Procedure: PARS PLANA VITRECTOMY WITH 25 GAUGE;  Surgeon: [name], MD;  Location: [place];  Service: Ophthalmology;  Laterality: Left;  membrane peel, endolaser, gas fluid exchange, silicone oil, repair of complex traction retinal detachment  . PARS PLANA VITRECTOMY  09/30/2011   Procedure:  PARS PLANA VITRECTOMY WITH 25 GAUGE;  Surgeon: [name], MD;  Location: [place];  Service: Ophthalmology;  Laterality: Right;  Endolaser; Repair of Complex Traction Retinal Detachment  . PARS PLANA VITRECTOMY  02/24/2012   Procedure: PARS PLANA VITRECTOMY WITH 25 GAUGE;  Surgeon: [name], MD;  Location: [place];  Service: Ophthalmology;  Laterality: Left;  . PTCA    . SILICON OIL REMOVAL  3/55/7322   Procedure: SILICON OIL REMOVAL;  Surgeon: [name], MD;  Location: [place];  Service: Ophthalmology;  Laterality: Left;  . THROMBECTOMY / ARTERIOVENOUS GRAFT REVISION    . TUBAL LIGATION     1979   Family History  Problem Relation Age of Onset  . Stroke Mother   . Heart attack Mother   . Heart disease Mother   . Colon cancer Father   . Colon cancer Sister   . Heart attack Brother   . Stroke Brother   . Diabetes Brother   . Breast cancer Sister   . Diabetes Brother   . Diabetes Daughter   . Anesthesia problems Neg Hx   . Hypotension Neg Hx   . Malignant hyperthermia Neg Hx   . Pseudochol deficiency Neg Hx     Allergies: Hydrocodone and Metformin Current Outpatient Prescriptions on File Prior to Visit  Medication Sig Dispense Refill  . albuterol (PROVENTIL HFA;VENTOLIN HFA) 108 (90 BASE) MCG/ACT inhaler Inhale 2 puffs into the lungs every 6 (six) hours as needed for wheezing or shortness of breath.    Marland Kitchen albuterol (PROVENTIL) (2.5 MG/3ML) 0.083% nebulizer solution Take 2.5 mg by nebulization every 4 (four) hours as needed for wheezing or shortness of breath.    Marland Kitchen aspirin EC 81 MG tablet Take 81 mg by mouth daily.    . carvedilol (COREG) 25 MG tablet Take 1 tablet (25 mg total) by mouth 2 (two) times daily with a meal. (Patient taking differently: Take 12.5 mg by mouth 2 (two) times daily with a meal. Take one tablet by mouth twice daily. If blood pressure is greater than 160 take two tablets by mouth twice daily.) 60 tablet 0  . cloNIDine (CATAPRES) 0.1 MG tablet Take 0.1  mg by mouth 2 (two) times daily.    . furosemide (LASIX) 80 MG tablet Take 80 mg by mouth daily.     . Melatonin 1 MG CAPS Take 10 mg by mouth daily.     . multivitamin (RENA-VIT) TABS tablet Take 1 tablet by mouth daily. 30 tablet 5  . naproxen sodium (ANAPROX) 220 MG tablet Take 220 mg by mouth daily as needed.    . nitroGLYCERIN (NITROSTAT) 0.4 MG SL tablet Place 1 tablet (0.4 mg total) under the tongue every 5 (five) minutes as  needed for chest pain. 25 tablet 3  . oxyCODONE-acetaminophen (PERCOCET/ROXICET) 5-325 MG tablet Take 1 tablet by mouth daily as needed for severe pain.     . pregabalin (LYRICA) 75 MG capsule Take 1 capsule (75 mg total) by mouth daily. 90 capsule 1  . atorvastatin (LIPITOR) 40 MG tablet Take 1 tablet (40 mg total) by mouth daily. (Patient taking differently: Take 40 mg by mouth daily at 6 PM. ) 90 tablet 3  . isosorbide mononitrate (IMDUR) 60 MG 24 hr tablet Take 1 tablet (60 mg total) by mouth daily. 90 tablet 3   Current Facility-Administered Medications on File Prior to Visit  Medication Dose Route Frequency Provider Last Rate Last Dose  . betamethasone acetate-betamethasone sodium phosphate (CELESTONE) injection 3 mg  3 mg Intramuscular Once Edrick Kins, DPM        Social History  Substance Use Topics  . Smoking status: Former Smoker    Packs/day: 1.00    Years: 25.00    Quit date: 06/09/2010  . Smokeless tobacco: Never Used  . Alcohol use No    Review of Systems  Constitutional: Negative for chills and fever.  Eyes: Negative for visual disturbance.  Respiratory: Negative for cough, shortness of breath and wheezing.   Cardiovascular: Negative for chest pain, palpitations and leg swelling.  Gastrointestinal: Negative for nausea and vomiting.  Neurological: Negative for headaches.      Objective:    BP (!) 142/58   Pulse 75   Temp 98.5 F (36.9 C) (Oral)   Ht 5\' 7"  (1.702 m)   Wt 170 lb 12.8 oz (77.5 kg)   SpO2 93%   BMI 26.75 kg/m  BP  Readings from Last 3 Encounters:  10/16/16 (!) 142/58  09/04/16 (!) 156/62  07/08/16 121/81   Wt Readings from Last 3 Encounters:  10/16/16 170 lb 12.8 oz (77.5 kg)  09/04/16 171 lb 2 oz (77.6 kg)  07/08/16 170 lb (77.1 kg)    Physical Exam  Constitutional: She appears well-developed and well-nourished.  Eyes: Conjunctivae are normal.  Cardiovascular: Normal rate, regular rhythm and normal pulses.   Murmur heard.  Systolic murmur is present with a grade of 2/6  SEM II/VI, Loudest LSB, non radiating, no thrill   Pulmonary/Chest: Effort normal and breath sounds normal. She has no wheezes. She has no rhonchi. She has no rales.  Neurological: She is alert.  Skin: Skin is warm and dry.  Psychiatric: She has a normal mood and affect. Her speech is normal and behavior is normal. Thought content normal.  Vitals reviewed.      Assessment & Plan:   Problem List Items Addressed This Visit      Cardiovascular and Mediastinum   Essential (primary) hypertension (Chronic)    Controlled. Will continue current regimen. Advised continued follow up with cardiology.       Relevant Medications   amLODipine (NORVASC) 10 MG tablet   Diastolic heart failure (HCC)    Weight stable. NO adventitious lung sounds. Will follow with [name].       Relevant Medications   amLODipine (NORVASC) 10 MG tablet     Respiratory   COPD (chronic obstructive pulmonary disease) (HCC) (Chronic)    Stable. sa02 93. No recent exacerbations. Will follow.         Genitourinary   ESRD on hemodialysis (HCC) (Chronic)    Stable. continues to follow with lateef. On kidney transplant list.         Other  Screening for breast cancer - Primary    Ordered. Patient will schedule.       Relevant Orders   MM DIGITAL SCREENING BILATERAL       I have discontinued Ms. Zunker's calcium acetate, losartan, and acetaminophen. I am also having her maintain her aspirin EC, albuterol, albuterol, furosemide,  nitroGLYCERIN, Melatonin, carvedilol, multivitamin, pregabalin, isosorbide mononitrate, atorvastatin, cloNIDine, oxyCODONE-acetaminophen, naproxen sodium, and amLODipine. We will continue to administer betamethasone acetate-betamethasone sodium phosphate.   Meds ordered this encounter  Medications  . amLODipine (NORVASC) 10 MG tablet    Return precautions given.   Risks, benefits, and alternatives of the medications and treatment plan prescribed today were discussed, and patient expressed understanding.   Education regarding symptom management and diagnosis given to patient on AVS.  Continue to follow with Burnard Hawthorne, FNP for routine health maintenance.   Theresa [name] and I agreed with plan.   Mable Paris, FNP

## 2016-10-16 NOTE — Assessment & Plan Note (Signed)
Controlled. Will continue current regimen. Advised continued follow up with cardiology.

## 2016-10-16 NOTE — Assessment & Plan Note (Signed)
Stable. sa02 93. No recent exacerbations. Will follow.

## 2016-10-17 DIAGNOSIS — Z992 Dependence on renal dialysis: Secondary | ICD-10-CM | POA: Diagnosis not present

## 2016-10-17 DIAGNOSIS — D631 Anemia in chronic kidney disease: Secondary | ICD-10-CM | POA: Diagnosis not present

## 2016-10-17 DIAGNOSIS — N2581 Secondary hyperparathyroidism of renal origin: Secondary | ICD-10-CM | POA: Diagnosis not present

## 2016-10-17 DIAGNOSIS — D509 Iron deficiency anemia, unspecified: Secondary | ICD-10-CM | POA: Diagnosis not present

## 2016-10-17 DIAGNOSIS — N186 End stage renal disease: Secondary | ICD-10-CM | POA: Diagnosis not present

## 2016-10-20 ENCOUNTER — Ambulatory Visit: Payer: Medicare Other | Admitting: Podiatry

## 2016-10-20 DIAGNOSIS — Z992 Dependence on renal dialysis: Secondary | ICD-10-CM | POA: Diagnosis not present

## 2016-10-20 DIAGNOSIS — N2581 Secondary hyperparathyroidism of renal origin: Secondary | ICD-10-CM | POA: Diagnosis not present

## 2016-10-20 DIAGNOSIS — D509 Iron deficiency anemia, unspecified: Secondary | ICD-10-CM | POA: Diagnosis not present

## 2016-10-20 DIAGNOSIS — D631 Anemia in chronic kidney disease: Secondary | ICD-10-CM | POA: Diagnosis not present

## 2016-10-20 DIAGNOSIS — N186 End stage renal disease: Secondary | ICD-10-CM | POA: Diagnosis not present

## 2016-10-22 DIAGNOSIS — D631 Anemia in chronic kidney disease: Secondary | ICD-10-CM | POA: Diagnosis not present

## 2016-10-22 DIAGNOSIS — N2581 Secondary hyperparathyroidism of renal origin: Secondary | ICD-10-CM | POA: Diagnosis not present

## 2016-10-22 DIAGNOSIS — N186 End stage renal disease: Secondary | ICD-10-CM | POA: Diagnosis not present

## 2016-10-22 DIAGNOSIS — Z992 Dependence on renal dialysis: Secondary | ICD-10-CM | POA: Diagnosis not present

## 2016-10-22 DIAGNOSIS — D509 Iron deficiency anemia, unspecified: Secondary | ICD-10-CM | POA: Diagnosis not present

## 2016-10-23 ENCOUNTER — Other Ambulatory Visit: Payer: Self-pay | Admitting: Family

## 2016-10-23 DIAGNOSIS — E1142 Type 2 diabetes mellitus with diabetic polyneuropathy: Secondary | ICD-10-CM

## 2016-10-24 DIAGNOSIS — Z992 Dependence on renal dialysis: Secondary | ICD-10-CM | POA: Diagnosis not present

## 2016-10-24 DIAGNOSIS — D631 Anemia in chronic kidney disease: Secondary | ICD-10-CM | POA: Diagnosis not present

## 2016-10-24 DIAGNOSIS — D509 Iron deficiency anemia, unspecified: Secondary | ICD-10-CM | POA: Diagnosis not present

## 2016-10-24 DIAGNOSIS — N186 End stage renal disease: Secondary | ICD-10-CM | POA: Diagnosis not present

## 2016-10-24 DIAGNOSIS — N2581 Secondary hyperparathyroidism of renal origin: Secondary | ICD-10-CM | POA: Diagnosis not present

## 2016-10-27 DIAGNOSIS — N186 End stage renal disease: Secondary | ICD-10-CM | POA: Diagnosis not present

## 2016-10-27 DIAGNOSIS — N2581 Secondary hyperparathyroidism of renal origin: Secondary | ICD-10-CM | POA: Diagnosis not present

## 2016-10-27 DIAGNOSIS — D631 Anemia in chronic kidney disease: Secondary | ICD-10-CM | POA: Diagnosis not present

## 2016-10-27 DIAGNOSIS — Z992 Dependence on renal dialysis: Secondary | ICD-10-CM | POA: Diagnosis not present

## 2016-10-27 DIAGNOSIS — D509 Iron deficiency anemia, unspecified: Secondary | ICD-10-CM | POA: Diagnosis not present

## 2016-10-29 DIAGNOSIS — Z992 Dependence on renal dialysis: Secondary | ICD-10-CM | POA: Diagnosis not present

## 2016-10-29 DIAGNOSIS — D509 Iron deficiency anemia, unspecified: Secondary | ICD-10-CM | POA: Diagnosis not present

## 2016-10-29 DIAGNOSIS — N2581 Secondary hyperparathyroidism of renal origin: Secondary | ICD-10-CM | POA: Diagnosis not present

## 2016-10-29 DIAGNOSIS — N186 End stage renal disease: Secondary | ICD-10-CM | POA: Diagnosis not present

## 2016-10-29 DIAGNOSIS — D631 Anemia in chronic kidney disease: Secondary | ICD-10-CM | POA: Diagnosis not present

## 2016-10-30 ENCOUNTER — Ambulatory Visit: Payer: Medicare Other | Admitting: Family

## 2016-10-31 DIAGNOSIS — D509 Iron deficiency anemia, unspecified: Secondary | ICD-10-CM | POA: Diagnosis not present

## 2016-10-31 DIAGNOSIS — N2581 Secondary hyperparathyroidism of renal origin: Secondary | ICD-10-CM | POA: Diagnosis not present

## 2016-10-31 DIAGNOSIS — D631 Anemia in chronic kidney disease: Secondary | ICD-10-CM | POA: Diagnosis not present

## 2016-10-31 DIAGNOSIS — N186 End stage renal disease: Secondary | ICD-10-CM | POA: Diagnosis not present

## 2016-10-31 DIAGNOSIS — Z992 Dependence on renal dialysis: Secondary | ICD-10-CM | POA: Diagnosis not present

## 2016-11-03 DIAGNOSIS — N2581 Secondary hyperparathyroidism of renal origin: Secondary | ICD-10-CM | POA: Diagnosis not present

## 2016-11-03 DIAGNOSIS — D631 Anemia in chronic kidney disease: Secondary | ICD-10-CM | POA: Diagnosis not present

## 2016-11-03 DIAGNOSIS — Z992 Dependence on renal dialysis: Secondary | ICD-10-CM | POA: Diagnosis not present

## 2016-11-03 DIAGNOSIS — D509 Iron deficiency anemia, unspecified: Secondary | ICD-10-CM | POA: Diagnosis not present

## 2016-11-03 DIAGNOSIS — N186 End stage renal disease: Secondary | ICD-10-CM | POA: Diagnosis not present

## 2016-11-05 DIAGNOSIS — D631 Anemia in chronic kidney disease: Secondary | ICD-10-CM | POA: Diagnosis not present

## 2016-11-05 DIAGNOSIS — N186 End stage renal disease: Secondary | ICD-10-CM | POA: Diagnosis not present

## 2016-11-05 DIAGNOSIS — D509 Iron deficiency anemia, unspecified: Secondary | ICD-10-CM | POA: Diagnosis not present

## 2016-11-05 DIAGNOSIS — Z992 Dependence on renal dialysis: Secondary | ICD-10-CM | POA: Diagnosis not present

## 2016-11-05 DIAGNOSIS — N2581 Secondary hyperparathyroidism of renal origin: Secondary | ICD-10-CM | POA: Diagnosis not present

## 2016-11-06 DIAGNOSIS — Z992 Dependence on renal dialysis: Secondary | ICD-10-CM | POA: Diagnosis not present

## 2016-11-06 DIAGNOSIS — N186 End stage renal disease: Secondary | ICD-10-CM | POA: Diagnosis not present

## 2016-11-07 ENCOUNTER — Other Ambulatory Visit: Payer: Self-pay | Admitting: Family

## 2016-11-07 DIAGNOSIS — N186 End stage renal disease: Secondary | ICD-10-CM | POA: Diagnosis not present

## 2016-11-07 DIAGNOSIS — N2581 Secondary hyperparathyroidism of renal origin: Secondary | ICD-10-CM | POA: Diagnosis not present

## 2016-11-07 DIAGNOSIS — D509 Iron deficiency anemia, unspecified: Secondary | ICD-10-CM | POA: Diagnosis not present

## 2016-11-07 DIAGNOSIS — D631 Anemia in chronic kidney disease: Secondary | ICD-10-CM | POA: Diagnosis not present

## 2016-11-07 DIAGNOSIS — Z992 Dependence on renal dialysis: Secondary | ICD-10-CM | POA: Diagnosis not present

## 2016-11-10 DIAGNOSIS — D509 Iron deficiency anemia, unspecified: Secondary | ICD-10-CM | POA: Diagnosis not present

## 2016-11-10 DIAGNOSIS — Z992 Dependence on renal dialysis: Secondary | ICD-10-CM | POA: Diagnosis not present

## 2016-11-10 DIAGNOSIS — D631 Anemia in chronic kidney disease: Secondary | ICD-10-CM | POA: Diagnosis not present

## 2016-11-10 DIAGNOSIS — N186 End stage renal disease: Secondary | ICD-10-CM | POA: Diagnosis not present

## 2016-11-10 DIAGNOSIS — N2581 Secondary hyperparathyroidism of renal origin: Secondary | ICD-10-CM | POA: Diagnosis not present

## 2016-11-12 DIAGNOSIS — Z992 Dependence on renal dialysis: Secondary | ICD-10-CM | POA: Diagnosis not present

## 2016-11-12 DIAGNOSIS — D631 Anemia in chronic kidney disease: Secondary | ICD-10-CM | POA: Diagnosis not present

## 2016-11-12 DIAGNOSIS — D509 Iron deficiency anemia, unspecified: Secondary | ICD-10-CM | POA: Diagnosis not present

## 2016-11-12 DIAGNOSIS — N186 End stage renal disease: Secondary | ICD-10-CM | POA: Diagnosis not present

## 2016-11-12 DIAGNOSIS — N2581 Secondary hyperparathyroidism of renal origin: Secondary | ICD-10-CM | POA: Diagnosis not present

## 2016-11-13 ENCOUNTER — Other Ambulatory Visit (HOSPITAL_COMMUNITY)
Admission: RE | Admit: 2016-11-13 | Discharge: 2016-11-13 | Disposition: A | Discharge status: Home / Self Care | Payer: Medicare Other | Source: Ambulatory Visit | Attending: Family | Admitting: Family

## 2016-11-13 ENCOUNTER — Ambulatory Visit (INDEPENDENT_AMBULATORY_CARE_PROVIDER_SITE_OTHER): Payer: Medicare Other | Admitting: Family

## 2016-11-13 ENCOUNTER — Encounter: Payer: Self-pay | Admitting: Family

## 2016-11-13 VITALS — BP 152/58 | HR 69 | Temp 98.4°F | Ht 67.0 in | Wt 171.6 lb

## 2016-11-13 DIAGNOSIS — Z0000 Encounter for general adult medical examination without abnormal findings: Secondary | ICD-10-CM | POA: Diagnosis not present

## 2016-11-13 DIAGNOSIS — Z794 Long term (current) use of insulin: Secondary | ICD-10-CM | POA: Diagnosis not present

## 2016-11-13 DIAGNOSIS — I10 Essential (primary) hypertension: Secondary | ICD-10-CM | POA: Diagnosis not present

## 2016-11-13 DIAGNOSIS — I120 Hypertensive chronic kidney disease with stage 5 chronic kidney disease or end stage renal disease: Secondary | ICD-10-CM | POA: Diagnosis not present

## 2016-11-13 DIAGNOSIS — N186 End stage renal disease: Secondary | ICD-10-CM | POA: Insufficient documentation

## 2016-11-13 DIAGNOSIS — E1122 Type 2 diabetes mellitus with diabetic chronic kidney disease: Secondary | ICD-10-CM | POA: Diagnosis not present

## 2016-11-13 DIAGNOSIS — Z992 Dependence on renal dialysis: Secondary | ICD-10-CM | POA: Insufficient documentation

## 2016-11-13 DIAGNOSIS — Z01419 Encounter for gynecological examination (general) (routine) without abnormal findings: Secondary | ICD-10-CM

## 2016-11-13 NOTE — Patient Instructions (Addendum)
As discussed, you need to monitor blood pressure- goal < 135/85.   Please continue to follow with Dr Cam Hai below.    Labs at dialysis- lipid panel    Address: [address], [place], [place], [place] [address]  Hours:  Open ? Closes 5PM                       Phone: [phone]   Health Maintenance, Female Adopting a healthy lifestyle and getting preventive care can go a long way to promote health and wellness. Talk with your health care provider about what schedule of regular examinations is right for you. This is a good chance for you to check in with your provider about disease prevention and staying healthy. In between checkups, there are plenty of things you can do on your own. Experts have done a lot of research about which lifestyle changes and preventive measures are most likely to keep you healthy. Ask your health care provider for more information. Weight and diet Eat a healthy diet  Be sure to include plenty of vegetables, fruits, low-fat dairy products, and lean protein.  Do not eat a lot of foods high in solid fats, added sugars, or salt.  Get regular exercise. This is one of the most important things you can do for your health. ? Most adults should exercise for at least 150 minutes each week. The exercise should increase your heart rate and make you sweat (moderate-intensity exercise). ? Most adults should also do strengthening exercises at least twice a week. This is in addition to the moderate-intensity exercise.  Maintain a healthy weight  Body mass index (BMI) is a measurement that can be used to identify possible weight problems. It estimates body fat based on height and weight. Your health care provider can help determine your BMI and help you achieve or maintain a healthy weight.  For females 67 years of age and older: ? A BMI below 18.5 is considered underweight. ? A BMI of 18.5 to 24.9 is normal. ? A BMI of 25 to 29.9 is  considered overweight. ? A BMI of 30 and above is considered obese.  Watch levels of cholesterol and blood lipids  You should start having your blood tested for lipids and cholesterol at 59 years of age, then have this test every 5 years.  You may need to have your cholesterol levels checked more often if: ? Your lipid or cholesterol levels are high. ? You are older than 59 years of age. ? You are at high risk for heart disease.  Cancer screening Lung Cancer  Lung cancer screening is recommended for adults 20-87 years old who are at high risk for lung cancer because of a history of smoking.  A yearly low-dose CT scan of the lungs is recommended for people who: ? Currently smoke. ? Have quit within the past 15 years. ? Have at least a 30-pack-year history of smoking. A pack year is smoking an average of one pack of cigarettes a day for 1 year.  Yearly screening should continue until it has been 15 years since you quit.  Yearly screening should stop if you develop a health problem that would prevent you from having lung cancer treatment.  Breast Cancer  Practice breast self-awareness. This means understanding how your breasts normally appear and feel.  It also means doing regular breast self-exams. Let your health care provider know about any changes, no matter how small.  If  you are in your 20s or 30s, you should have a clinical breast exam (CBE) by a health care provider every 1-3 years as part of a regular health exam.  If you are 74 or older, have a CBE every year. Also consider having a breast X-ray (mammogram) every year.  If you have a family history of breast cancer, talk to your health care provider about genetic screening.  If you are at high risk for breast cancer, talk to your health care provider about having an MRI and a mammogram every year.  Breast cancer gene (BRCA) assessment is recommended for women who have family members with BRCA-related cancers.  BRCA-related cancers include: ? Breast. ? Ovarian. ? Tubal. ? Peritoneal cancers.  Results of the assessment will determine the need for genetic counseling and BRCA1 and BRCA2 testing.  Cervical Cancer Your health care provider may recommend that you be screened regularly for cancer of the pelvic organs (ovaries, uterus, and vagina). This screening involves a pelvic examination, including checking for microscopic changes to the surface of your cervix (Pap test). You may be encouraged to have this screening done every 3 years, beginning at age 14.  For women ages 3-65, health care providers may recommend pelvic exams and Pap testing every 3 years, or they may recommend the Pap and pelvic exam, combined with testing for human papilloma virus (HPV), every 5 years. Some types of HPV increase your risk of cervical cancer. Testing for HPV may also be done on women of any age with unclear Pap test results.  Other health care providers may not recommend any screening for nonpregnant women who are considered low risk for pelvic cancer and who do not have symptoms. Ask your health care provider if a screening pelvic exam is right for you.  If you have had past treatment for cervical cancer or a condition that could lead to cancer, you need Pap tests and screening for cancer for at least 20 years after your treatment. If Pap tests have been discontinued, your risk factors (such as having a new sexual partner) need to be reassessed to determine if screening should resume. Some women have medical problems that increase the chance of getting cervical cancer. In these cases, your health care provider may recommend more frequent screening and Pap tests.  Colorectal Cancer  This type of cancer can be detected and often prevented.  Routine colorectal cancer screening usually begins at 59 years of age and continues through 59 years of age.  Your health care provider may recommend screening at an earlier age if  you have risk factors for colon cancer.  Your health care provider may also recommend using home test kits to check for hidden blood in the stool.  A small camera at the end of a tube can be used to examine your colon directly (sigmoidoscopy or colonoscopy). This is done to check for the earliest forms of colorectal cancer.  Routine screening usually begins at age 4.  Direct examination of the colon should be repeated every 5-10 years through 59 years of age. However, you may need to be screened more often if early forms of precancerous polyps or small growths are found.  Skin Cancer  Check your skin from head to toe regularly.  Tell your health care provider about any new moles or changes in moles, especially if there is a change in a mole's shape or color.  Also tell your health care provider if you have a mole that is larger  than the size of a pencil eraser.  Always use sunscreen. Apply sunscreen liberally and repeatedly throughout the day.  Protect yourself by wearing long sleeves, pants, a wide-brimmed hat, and sunglasses whenever you are outside.  Heart disease, diabetes, and high blood pressure  High blood pressure causes heart disease and increases the risk of stroke. High blood pressure is more likely to develop in: ? People who have blood pressure in the high end of the normal range (130-139/85-89 mm Hg). ? People who are overweight or obese. ? People who are African American.  If you are 1-46 years of age, have your blood pressure checked every 3-5 years. If you are 41 years of age or older, have your blood pressure checked every year. You should have your blood pressure measured twice-once when you are at a hospital or clinic, and once when you are not at a hospital or clinic. Record the average of the two measurements. To check your blood pressure when you are not at a hospital or clinic, you can use: ? An automated blood pressure machine at a pharmacy. ? A home blood  pressure monitor.  If you are between 48 years and 66 years old, ask your health care provider if you should take aspirin to prevent strokes.  Have regular diabetes screenings. This involves taking a blood sample to check your fasting blood sugar level. ? If you are at a normal weight and have a low risk for diabetes, have this test once every three years after 59 years of age. ? If you are overweight and have a high risk for diabetes, consider being tested at a younger age or more often. Preventing infection Hepatitis B  If you have a higher risk for hepatitis B, you should be screened for this virus. You are considered at high risk for hepatitis B if: ? You were born in a country where hepatitis B is common. Ask your health care provider which countries are considered high risk. ? Your parents were born in a high-risk country, and you have not been immunized against hepatitis B (hepatitis B vaccine). ? You have HIV or AIDS. ? You use needles to inject street drugs. ? You live with someone who has hepatitis B. ? You have had sex with someone who has hepatitis B. ? You get hemodialysis treatment. ? You take certain medicines for conditions, including cancer, organ transplantation, and autoimmune conditions.  Hepatitis C  Blood testing is recommended for: ? Everyone born from [birth year] through 1963. ? Anyone with known risk factors for hepatitis C.  Sexually transmitted infections (STIs)  You should be screened for sexually transmitted infections (STIs) including gonorrhea and chlamydia if: ? You are sexually active and are younger than 59 years of age. ? You are older than 59 years of age and your health care provider tells you that you are at risk for this type of infection. ? Your sexual activity has changed since you were last screened and you are at an increased risk for chlamydia or gonorrhea. Ask your health care provider if you are at risk.  If you do not have HIV, but are at risk,  it may be recommended that you take a prescription medicine daily to prevent HIV infection. This is called pre-exposure prophylaxis (PrEP). You are considered at risk if: ? You are sexually active and do not regularly use condoms or know the HIV status of your partner(s). ? You take drugs by injection. ? You are sexually active with  a partner who has HIV.  Talk with your health care provider about whether you are at high risk of being infected with HIV. If you choose to begin PrEP, you should first be tested for HIV. You should then be tested every 3 months for as long as you are taking PrEP. Pregnancy  If you are premenopausal and you may become pregnant, ask your health care provider about preconception counseling.  If you may become pregnant, take 400 to 800 micrograms (mcg) of folic acid every day.  If you want to prevent pregnancy, talk to your health care provider about birth control (contraception). Osteoporosis and menopause  Osteoporosis is a disease in which the bones lose minerals and strength with aging. This can result in serious bone fractures. Your risk for osteoporosis can be identified using a bone density scan.  If you are 79 years of age or older, or if you are at risk for osteoporosis and fractures, ask your health care provider if you should be screened.  Ask your health care provider whether you should take a calcium or vitamin D supplement to lower your risk for osteoporosis.  Menopause may have certain physical symptoms and risks.  Hormone replacement therapy may reduce some of these symptoms and risks. Talk to your health care provider about whether hormone replacement therapy is right for you. Follow these instructions at home:  Schedule regular health, dental, and eye exams.  Stay current with your immunizations.  Do not use any tobacco products including cigarettes, chewing tobacco, or electronic cigarettes.  If you are pregnant, do not drink  alcohol.  If you are breastfeeding, limit how much and how often you drink alcohol.  Limit alcohol intake to no more than 1 drink per day for nonpregnant women. One drink equals 12 ounces of beer, 5 ounces of wine, or 1 ounces of hard liquor.  Do not use street drugs.  Do not share needles.  Ask your health care provider for help if you need support or information about quitting drugs.  Tell your health care provider if you often feel depressed.  Tell your health care provider if you have ever been abused or do not feel safe at home. This information is not intended to replace advice given to you by your health care provider. Make sure you discuss any questions you have with your health care provider. Document Released: 12/09/2010 Document Revised: 11/01/2015 Document Reviewed: 02/27/2015 Elsevier Interactive Patient Education  [employer].

## 2016-11-13 NOTE — Progress Notes (Addendum)
Subjective:    Patient ID: Theresa [name], female    DOB: 01-14-1956, 61 y.o.   MRN: [id number]  CC: Theresa [name] is a 61 y.o. female who presents today for physical exam.    HPI:      Feeling well. No complaints.   DM - prior a1c had been 6 , now 6.9.   HTN- compliant with medications; hadn't used nitro since 2014. Takes lasix QOD. Had ham sandwich today and think that raised her blood pressure; no longer follows with CHF clinic. Denies exertional chest pain or pressure, numbness or tingling radiating to left arm or jaw, palpitations, dizziness, frequent headaches, changes in vision, or shortness of breath.    Colorectal Cancer Screening: [id number] , multiple polps ; repeat in one year Breast Cancer Screening: due; has made an appt Cervical Cancer Screening: h/o hysterectomy; no vaginal pain, vaginal bleeding. Had done pap in the past and like to repeat.  Bone Health screening/DEXA for 65+: No increased fracture risk. Defer screening at this time Lung Cancer Screening: Doesn't have 30 year pack year history and age > 41 years.       Tetanus - utd        Pneumococcal - done  Labs: Screening labs done at dialysis.  Exercise: Gets regular exercise.  Alcohol use: none Smoking/tobacco use: Nonsmoker.  Regular dental exams: UTD Wears seat belt: Yes. Skin: no new lesions; no h/o skin cancer    HISTORY:  Past Medical History:  Diagnosis Date  . Anemia of chronic disease   . Carotid arterial disease ([place])    a. 02/2013 U/S: 40-59% bilat ICA stenosis - *f/u 02/2014*  . Chronic constipation   . Chronic diastolic CHF (congestive heart failure) ([place])    a. 10/2013 Echo [facility]): EF 55-60%, mod conc LVH, mod MR, mildly dil LA, mild Ao sclerosis w/o stenosis.  . Colon polyps   . COPD (chronic obstructive pulmonary disease) ([place])   . Coronary artery disease    a. 05/2013 NSTEMI/PCI: LM 20d, LAD min irregs, LCX small, nl, OM1 nl, RCA dom 61m (2.5x16 Promus DES), PDA1 80p.  . Diabetes  mellitus   . Diabetic neuropathy ([place])   . Diabetic retinopathy ([place]) 05/28/2013   Hx bilat retinal detachment, proliferative diab retinopathy and bilat vitreous hemorrhage   . Emphysema   . ESRD on hemodialysis ([place])    a. [facility] in [place], [place], on a TTS schedule.  She started dialysis in Feb 2014.  Etiology of renal failure not known, likely diabetes.  Has a left upper arm AV graft.  . History of bronchitis    Mar 2012  . History of pneumonia    June 2012  . History of tobacco abuse    a. Quit 2012.  Marland Kitchen Hyperlipidemia   . Hypertension   . Moderate mitral insufficiency    a. 10/2013 Echo: EF 55-60%, mod MR.  . Myocardial infarct ([place]) 05/2013  . Peripheral vascular disease ([place])   . Sickle cell trait [facility])     Past Surgical History:  Procedure Laterality Date  . ABDOMINAL HYSTERECTOMY     2000  . CARDIAC CATHETERIZATION    . CARDIAC CATHETERIZATION N/A 05/02/2015   Procedure: Left Heart Cath and Coronary Angiography;  Surgeon: [name], MD;  Location: [place] CV LAB;  Service: Cardiovascular;  Laterality: N/A;  . COLONOSCOPY WITH PROPOFOL N/A 07/08/2016   Procedure: COLONOSCOPY WITH PROPOFOL;  Surgeon: [name], MD;  Location: [facility] ENDOSCOPY;  Service: Endoscopy;  Laterality: N/A;  . colonscopy    . CORONARY ANGIOPLASTY  05/28/2014   stent placement to the mid RCA  . DILATION AND CURETTAGE OF UTERUS     several in the early 80's  . ESOPHAGOGASTRODUODENOSCOPY     2012  . EYE SURGERY     bilateral laser 2012  . EYE SURGERY     right  . GAS INSERTION  09/30/2011   Procedure: INSERTION OF GAS;  Surgeon: [name], MD;  Location: [place];  Service: Ophthalmology;  Laterality: Right;  [id number]  . GAS/FLUID EXCHANGE  09/30/2011   Procedure: GAS/FLUID EXCHANGE;  Surgeon: [name], MD;  Location: [place];  Service: Ophthalmology;  Laterality: Right;  . LEFT HEART CATHETERIZATION WITH CORONARY ANGIOGRAM N/A 05/28/2013   Procedure: LEFT HEART CATHETERIZATION  WITH CORONARY ANGIOGRAM;  Surgeon: [name], MD;  Location: [facility] CATH LAB;  Service: Cardiovascular;  Laterality: N/A;  . PARS PLANA VITRECTOMY  04/22/2011   Procedure: PARS PLANA VITRECTOMY WITH 25 GAUGE;  Surgeon: [name], MD;  Location: [place];  Service: Ophthalmology;  Laterality: Left;  membrane peel, endolaser, gas fluid exchange, silicone oil, repair of complex traction retinal detachment  . PARS PLANA VITRECTOMY  09/30/2011   Procedure: PARS PLANA VITRECTOMY WITH 25 GAUGE;  Surgeon: [name], MD;  Location: [place];  Service: Ophthalmology;  Laterality: Right;  Endolaser; Repair of Complex Traction Retinal Detachment  . PARS PLANA VITRECTOMY  02/24/2012   Procedure: PARS PLANA VITRECTOMY WITH 25 GAUGE;  Surgeon: [name], MD;  Location: [place];  Service: Ophthalmology;  Laterality: Left;  . PTCA    . SILICON OIL REMOVAL  8/65/7846   Procedure: SILICON OIL REMOVAL;  Surgeon: [name], MD;  Location: [place];  Service: Ophthalmology;  Laterality: Left;  . THROMBECTOMY / ARTERIOVENOUS GRAFT REVISION    . TUBAL LIGATION     1979   Family History  Problem Relation Age of Onset  . Stroke Mother   . Heart attack Mother   . Heart disease Mother   . Colon cancer Father   . Colon cancer Sister   . Heart attack Brother   . Stroke Brother   . Diabetes Brother   . Breast cancer Sister   . Diabetes Brother   . Diabetes Daughter   . Anesthesia problems Neg Hx   . Hypotension Neg Hx   . Malignant hyperthermia Neg Hx   . Pseudochol deficiency Neg Hx       ALLERGIES: Hydrocodone and Metformin  Current Outpatient Prescriptions on File Prior to Visit  Medication Sig Dispense Refill  . albuterol (PROVENTIL HFA;VENTOLIN HFA) 108 (90 BASE) MCG/ACT inhaler Inhale 2 puffs into the lungs every 6 (six) hours as needed for wheezing or shortness of breath.    Marland Kitchen albuterol (PROVENTIL) (2.5 MG/3ML) 0.083% nebulizer solution Take 2.5 mg by nebulization every 4 (four) hours  as needed for wheezing or shortness of breath.    Marland Kitchen amLODipine (NORVASC) 10 MG tablet     . aspirin EC 81 MG tablet Take 81 mg by mouth daily.    . carvedilol (COREG) 25 MG tablet Take 1 tablet (25 mg total) by mouth 2 (two) times daily with a meal. (Patient taking differently: Take 12.5 mg by mouth 2 (two) times daily with a meal. Take one tablet by mouth twice daily. If blood pressure is greater than 160 take two tablets by mouth twice daily.) 60 tablet 0  . cloNIDine (  CATAPRES) 0.1 MG tablet Take 0.1 mg by mouth 2 (two) times daily.    . furosemide (LASIX) 80 MG tablet Take 80 mg by mouth daily.     . Melatonin 1 MG CAPS Take 10 mg by mouth daily.     . multivitamin (RENA-VIT) TABS tablet TAKE ONE TABLET BY MOUTH ONCE DAILY 30 tablet 5  . naproxen sodium (ANAPROX) 220 MG tablet Take 220 mg by mouth daily as needed.    . nitroGLYCERIN (NITROSTAT) 0.4 MG SL tablet Place 1 tablet (0.4 mg total) under the tongue every 5 (five) minutes as needed for chest pain. 25 tablet 3  . oxyCODONE-acetaminophen (PERCOCET/ROXICET) 5-325 MG tablet Take 1 tablet by mouth daily as needed for severe pain.     . pregabalin (LYRICA) 75 MG capsule Take 1 capsule (75 mg total) by mouth daily. 90 capsule 1  . atorvastatin (LIPITOR) 40 MG tablet Take 1 tablet (40 mg total) by mouth daily. (Patient taking differently: Take 40 mg by mouth daily at 6 PM. ) 90 tablet 3  . isosorbide mononitrate (IMDUR) 60 MG 24 hr tablet Take 1 tablet (60 mg total) by mouth daily. 90 tablet 3   Current Facility-Administered Medications on File Prior to Visit  Medication Dose Route Frequency Provider Last Rate Last Dose  . betamethasone acetate-betamethasone sodium phosphate (CELESTONE) injection 3 mg  3 mg Intramuscular Once Edrick Kins, DPM        Social History  Substance Use Topics  . Smoking status: Former Smoker    Packs/day: 1.00    Years: 25.00    Quit date: 06/09/2010  . Smokeless tobacco: Never Used  . Alcohol use No     Review of Systems  Constitutional: Negative for chills, fever and unexpected weight change.  HENT: Negative for congestion.   Eyes: Negative for visual disturbance.  Respiratory: Negative for cough.   Cardiovascular: Negative for chest pain, palpitations and leg swelling.  Gastrointestinal: Negative for nausea and vomiting.  Genitourinary: Negative for vaginal discharge and vaginal pain.  Musculoskeletal: Negative for arthralgias and myalgias.  Skin: Negative for rash.  Neurological: Negative for headaches.  Hematological: Negative for adenopathy.  Psychiatric/Behavioral: Negative for confusion.      Objective:    BP (!) 152/58   Pulse 69   Temp 98.4 F (36.9 C) (Oral)   Ht 5\' 7"  (1.702 m)   Wt 171 lb 9.6 oz (77.8 kg)   SpO2 95%   BMI 26.88 kg/m   BP Readings from Last 3 Encounters:  11/13/16 (!) 152/58  10/16/16 (!) 142/58  09/04/16 (!) 156/62   Wt Readings from Last 3 Encounters:  11/13/16 171 lb 9.6 oz (77.8 kg)  10/16/16 170 lb 12.8 oz (77.5 kg)  09/04/16 171 lb 2 oz (77.6 kg)    Physical Exam  Constitutional: She appears well-developed and well-nourished.  Eyes: Conjunctivae are normal.  Neck: No thyroid mass and no thyromegaly present.  Cardiovascular: Normal rate, regular rhythm, normal heart sounds and normal pulses.   Pulmonary/Chest: Effort normal and breath sounds normal. She has no wheezes. She has no rhonchi. She has no rales. Right breast exhibits no inverted nipple, no mass, no nipple discharge, no skin change and no tenderness. Left breast exhibits no inverted nipple, no mass, no nipple discharge, no skin change and no tenderness. Breasts are symmetrical.  No masses or asymmetry appreciated during CBE.  Genitourinary: Rectum normal. Rectal exam shows no external hemorrhoid. Right adnexum displays no mass, no tenderness and no  fullness. Left adnexum displays no mass, no tenderness and no fullness. No erythema, tenderness or bleeding in the vagina. No  foreign body in the vagina. No signs of injury around the vagina. No vaginal discharge found.  Genitourinary Comments: Normal hair distribution. No lesions seen over suprapubic area. No masses, tenderness of urethra.  Pap performed. No cervix.   Lymphadenopathy:       Head (right side): No submental, no submandibular, no tonsillar, no preauricular, no posterior auricular and no occipital adenopathy present.       Head (left side): No submental, no submandibular, no tonsillar, no preauricular, no posterior auricular and no occipital adenopathy present.       Right cervical: No superficial cervical, no deep cervical and no posterior cervical adenopathy present.      Left cervical: No superficial cervical, no deep cervical and no posterior cervical adenopathy present.    She has no axillary adenopathy.       Right axillary: No pectoral and no lateral adenopathy present.       Left axillary: No pectoral and no lateral adenopathy present. Neurological: She is alert.  Skin: Skin is warm and dry.  Psychiatric: She has a normal mood and affect. Her speech is normal and behavior is normal. Thought content normal.  Vitals reviewed.      Assessment & Plan:   Problem List Items Addressed This Visit      Cardiovascular and Mediastinum   Accelerated hypertension    Slightly elevated today. Patient is no chest pain, shortness of breath. She has dialysis scheduled tomorrow. Advised her to watch blood pressure today; we jointly agreed perhaps increased salt intake today has increased blood pressure.        Endocrine   Diabetes mellitus, type II ([place]) (Chronic)    Patient reports A1c being 6.9 at ESRD. She's not taking any medication for diabetes. At this time, we jointly agreed would continue to watch A1c as she still below goal of 7%.        Other   Routine general medical examination at a health care facility - Primary    Colonoscopy due again next year. Patient has a history of a hysterectomy  however she's had a Pap in the past and would like to repeat Pap this year. Clinical breast exam performed. Screening labs to be done at dialysis prescription written for patient.       Relevant Orders   Cytology - PAP       I am having Ms. Erskin maintain her aspirin EC, albuterol, albuterol, furosemide, nitroGLYCERIN, Melatonin, carvedilol, pregabalin, isosorbide mononitrate, atorvastatin, cloNIDine, oxyCODONE-acetaminophen, naproxen sodium, amLODipine, and multivitamin. We will continue to administer betamethasone acetate-betamethasone sodium phosphate.   No orders of the defined types were placed in this encounter.   Return precautions given.   Risks, benefits, and alternatives of the medications and treatment plan prescribed today were discussed, and patient expressed understanding.   Education regarding symptom management and diagnosis given to patient on AVS.   Continue to follow with Burnard Hawthorne, FNP for routine health maintenance.   Theresa [name] and I agreed with plan.   Mable Paris, FNP

## 2016-11-13 NOTE — Progress Notes (Signed)
Pre visit review using our clinic review tool, if applicable. No additional management support is needed unless otherwise documented below in the visit note. 

## 2016-11-14 ENCOUNTER — Telehealth: Payer: Self-pay | Admitting: *Deleted

## 2016-11-14 DIAGNOSIS — N2581 Secondary hyperparathyroidism of renal origin: Secondary | ICD-10-CM | POA: Diagnosis not present

## 2016-11-14 DIAGNOSIS — N186 End stage renal disease: Secondary | ICD-10-CM | POA: Diagnosis not present

## 2016-11-14 DIAGNOSIS — D631 Anemia in chronic kidney disease: Secondary | ICD-10-CM | POA: Diagnosis not present

## 2016-11-14 DIAGNOSIS — D509 Iron deficiency anemia, unspecified: Secondary | ICD-10-CM | POA: Diagnosis not present

## 2016-11-14 DIAGNOSIS — Z992 Dependence on renal dialysis: Secondary | ICD-10-CM | POA: Diagnosis not present

## 2016-11-14 LAB — HM PAP SMEAR: HM PAP: NEGATIVE

## 2016-11-14 NOTE — Assessment & Plan Note (Signed)
Patient reports A1c being 6.9 at ESRD. She's not taking any medication for diabetes. At this time, we jointly agreed would continue to watch A1c as she still below goal of 7%.

## 2016-11-14 NOTE — Assessment & Plan Note (Signed)
Slightly elevated today. Patient is no chest pain, shortness of breath. She has dialysis scheduled tomorrow. Advised her to watch blood pressure today; we jointly agreed perhaps increased salt intake today has increased blood pressure.

## 2016-11-14 NOTE — Telephone Encounter (Signed)
Patient stated that she missed a call [phone]

## 2016-11-14 NOTE — Telephone Encounter (Signed)
Patient had an old voicmail. Patient thought it was about her labs.  Informed patient that her labs have yet to result.  Once they result she will be called for results.

## 2016-11-14 NOTE — Assessment & Plan Note (Signed)
Colonoscopy due again next year. Patient has a history of a hysterectomy however she's had a Pap in the past and would like to repeat Pap this year. Clinical breast exam performed. Screening labs to be done at dialysis prescription written for patient.

## 2016-11-17 DIAGNOSIS — D509 Iron deficiency anemia, unspecified: Secondary | ICD-10-CM | POA: Diagnosis not present

## 2016-11-17 DIAGNOSIS — N2581 Secondary hyperparathyroidism of renal origin: Secondary | ICD-10-CM | POA: Diagnosis not present

## 2016-11-17 DIAGNOSIS — N186 End stage renal disease: Secondary | ICD-10-CM | POA: Diagnosis not present

## 2016-11-17 DIAGNOSIS — Z992 Dependence on renal dialysis: Secondary | ICD-10-CM | POA: Diagnosis not present

## 2016-11-17 DIAGNOSIS — D631 Anemia in chronic kidney disease: Secondary | ICD-10-CM | POA: Diagnosis not present

## 2016-11-17 LAB — CYTOLOGY - PAP
Diagnosis: NEGATIVE
HPV: NOT DETECTED

## 2016-11-18 DIAGNOSIS — M5416 Radiculopathy, lumbar region: Secondary | ICD-10-CM | POA: Diagnosis not present

## 2016-11-18 DIAGNOSIS — M5136 Other intervertebral disc degeneration, lumbar region: Secondary | ICD-10-CM | POA: Diagnosis not present

## 2016-11-19 DIAGNOSIS — D509 Iron deficiency anemia, unspecified: Secondary | ICD-10-CM | POA: Diagnosis not present

## 2016-11-19 DIAGNOSIS — Z992 Dependence on renal dialysis: Secondary | ICD-10-CM | POA: Diagnosis not present

## 2016-11-19 DIAGNOSIS — D631 Anemia in chronic kidney disease: Secondary | ICD-10-CM | POA: Diagnosis not present

## 2016-11-19 DIAGNOSIS — N2581 Secondary hyperparathyroidism of renal origin: Secondary | ICD-10-CM | POA: Diagnosis not present

## 2016-11-19 DIAGNOSIS — N186 End stage renal disease: Secondary | ICD-10-CM | POA: Diagnosis not present

## 2016-11-20 DIAGNOSIS — D509 Iron deficiency anemia, unspecified: Secondary | ICD-10-CM | POA: Diagnosis not present

## 2016-11-20 DIAGNOSIS — N186 End stage renal disease: Secondary | ICD-10-CM | POA: Diagnosis not present

## 2016-11-20 DIAGNOSIS — N2581 Secondary hyperparathyroidism of renal origin: Secondary | ICD-10-CM | POA: Diagnosis not present

## 2016-11-20 DIAGNOSIS — D631 Anemia in chronic kidney disease: Secondary | ICD-10-CM | POA: Diagnosis not present

## 2016-11-20 DIAGNOSIS — Z992 Dependence on renal dialysis: Secondary | ICD-10-CM | POA: Diagnosis not present

## 2016-11-21 DIAGNOSIS — N2581 Secondary hyperparathyroidism of renal origin: Secondary | ICD-10-CM | POA: Diagnosis not present

## 2016-11-21 DIAGNOSIS — Z992 Dependence on renal dialysis: Secondary | ICD-10-CM | POA: Diagnosis not present

## 2016-11-21 DIAGNOSIS — D509 Iron deficiency anemia, unspecified: Secondary | ICD-10-CM | POA: Diagnosis not present

## 2016-11-21 DIAGNOSIS — D631 Anemia in chronic kidney disease: Secondary | ICD-10-CM | POA: Diagnosis not present

## 2016-11-21 DIAGNOSIS — N186 End stage renal disease: Secondary | ICD-10-CM | POA: Diagnosis not present

## 2016-11-24 DIAGNOSIS — N186 End stage renal disease: Secondary | ICD-10-CM | POA: Diagnosis not present

## 2016-11-24 DIAGNOSIS — D509 Iron deficiency anemia, unspecified: Secondary | ICD-10-CM | POA: Diagnosis not present

## 2016-11-24 DIAGNOSIS — Z992 Dependence on renal dialysis: Secondary | ICD-10-CM | POA: Diagnosis not present

## 2016-11-24 DIAGNOSIS — N2581 Secondary hyperparathyroidism of renal origin: Secondary | ICD-10-CM | POA: Diagnosis not present

## 2016-11-24 DIAGNOSIS — D631 Anemia in chronic kidney disease: Secondary | ICD-10-CM | POA: Diagnosis not present

## 2016-11-26 DIAGNOSIS — D631 Anemia in chronic kidney disease: Secondary | ICD-10-CM | POA: Diagnosis not present

## 2016-11-26 DIAGNOSIS — N2581 Secondary hyperparathyroidism of renal origin: Secondary | ICD-10-CM | POA: Diagnosis not present

## 2016-11-26 DIAGNOSIS — Z992 Dependence on renal dialysis: Secondary | ICD-10-CM | POA: Diagnosis not present

## 2016-11-26 DIAGNOSIS — N186 End stage renal disease: Secondary | ICD-10-CM | POA: Diagnosis not present

## 2016-11-26 DIAGNOSIS — D509 Iron deficiency anemia, unspecified: Secondary | ICD-10-CM | POA: Diagnosis not present

## 2016-11-27 DIAGNOSIS — E11319 Type 2 diabetes mellitus with unspecified diabetic retinopathy without macular edema: Secondary | ICD-10-CM | POA: Diagnosis not present

## 2016-11-27 DIAGNOSIS — H43313 Vitreous membranes and strands, bilateral: Secondary | ICD-10-CM | POA: Diagnosis not present

## 2016-11-27 LAB — HM DIABETES EYE EXAM

## 2016-11-28 ENCOUNTER — Encounter: Payer: Self-pay | Admitting: Family

## 2016-11-28 DIAGNOSIS — Z992 Dependence on renal dialysis: Secondary | ICD-10-CM | POA: Diagnosis not present

## 2016-11-28 DIAGNOSIS — D509 Iron deficiency anemia, unspecified: Secondary | ICD-10-CM | POA: Diagnosis not present

## 2016-11-28 DIAGNOSIS — D631 Anemia in chronic kidney disease: Secondary | ICD-10-CM | POA: Diagnosis not present

## 2016-11-28 DIAGNOSIS — N186 End stage renal disease: Secondary | ICD-10-CM | POA: Diagnosis not present

## 2016-11-28 DIAGNOSIS — N2581 Secondary hyperparathyroidism of renal origin: Secondary | ICD-10-CM | POA: Diagnosis not present

## 2016-12-01 DIAGNOSIS — D631 Anemia in chronic kidney disease: Secondary | ICD-10-CM | POA: Diagnosis not present

## 2016-12-01 DIAGNOSIS — N186 End stage renal disease: Secondary | ICD-10-CM | POA: Diagnosis not present

## 2016-12-01 DIAGNOSIS — Z992 Dependence on renal dialysis: Secondary | ICD-10-CM | POA: Diagnosis not present

## 2016-12-01 DIAGNOSIS — D509 Iron deficiency anemia, unspecified: Secondary | ICD-10-CM | POA: Diagnosis not present

## 2016-12-01 DIAGNOSIS — N2581 Secondary hyperparathyroidism of renal origin: Secondary | ICD-10-CM | POA: Diagnosis not present

## 2016-12-03 DIAGNOSIS — Z992 Dependence on renal dialysis: Secondary | ICD-10-CM | POA: Diagnosis not present

## 2016-12-03 DIAGNOSIS — N2581 Secondary hyperparathyroidism of renal origin: Secondary | ICD-10-CM | POA: Diagnosis not present

## 2016-12-03 DIAGNOSIS — N186 End stage renal disease: Secondary | ICD-10-CM | POA: Diagnosis not present

## 2016-12-03 DIAGNOSIS — D509 Iron deficiency anemia, unspecified: Secondary | ICD-10-CM | POA: Diagnosis not present

## 2016-12-03 DIAGNOSIS — D631 Anemia in chronic kidney disease: Secondary | ICD-10-CM | POA: Diagnosis not present

## 2016-12-05 DIAGNOSIS — N186 End stage renal disease: Secondary | ICD-10-CM | POA: Diagnosis not present

## 2016-12-05 DIAGNOSIS — Z992 Dependence on renal dialysis: Secondary | ICD-10-CM | POA: Diagnosis not present

## 2016-12-05 DIAGNOSIS — D509 Iron deficiency anemia, unspecified: Secondary | ICD-10-CM | POA: Diagnosis not present

## 2016-12-05 DIAGNOSIS — N2581 Secondary hyperparathyroidism of renal origin: Secondary | ICD-10-CM | POA: Diagnosis not present

## 2016-12-05 DIAGNOSIS — D631 Anemia in chronic kidney disease: Secondary | ICD-10-CM | POA: Diagnosis not present

## 2016-12-06 DIAGNOSIS — N186 End stage renal disease: Secondary | ICD-10-CM | POA: Diagnosis not present

## 2016-12-06 DIAGNOSIS — Z992 Dependence on renal dialysis: Secondary | ICD-10-CM | POA: Diagnosis not present

## 2016-12-07 DIAGNOSIS — N186 End stage renal disease: Secondary | ICD-10-CM | POA: Diagnosis not present

## 2016-12-07 DIAGNOSIS — Z992 Dependence on renal dialysis: Secondary | ICD-10-CM | POA: Diagnosis not present

## 2016-12-07 DIAGNOSIS — D509 Iron deficiency anemia, unspecified: Secondary | ICD-10-CM | POA: Diagnosis not present

## 2016-12-07 DIAGNOSIS — N2581 Secondary hyperparathyroidism of renal origin: Secondary | ICD-10-CM | POA: Diagnosis not present

## 2016-12-07 DIAGNOSIS — D631 Anemia in chronic kidney disease: Secondary | ICD-10-CM | POA: Diagnosis not present

## 2016-12-08 DIAGNOSIS — D509 Iron deficiency anemia, unspecified: Secondary | ICD-10-CM | POA: Diagnosis not present

## 2016-12-08 DIAGNOSIS — D631 Anemia in chronic kidney disease: Secondary | ICD-10-CM | POA: Diagnosis not present

## 2016-12-08 DIAGNOSIS — Z992 Dependence on renal dialysis: Secondary | ICD-10-CM | POA: Diagnosis not present

## 2016-12-08 DIAGNOSIS — N2581 Secondary hyperparathyroidism of renal origin: Secondary | ICD-10-CM | POA: Diagnosis not present

## 2016-12-08 DIAGNOSIS — N186 End stage renal disease: Secondary | ICD-10-CM | POA: Diagnosis not present

## 2016-12-10 DIAGNOSIS — D631 Anemia in chronic kidney disease: Secondary | ICD-10-CM | POA: Diagnosis not present

## 2016-12-10 DIAGNOSIS — Z992 Dependence on renal dialysis: Secondary | ICD-10-CM | POA: Diagnosis not present

## 2016-12-10 DIAGNOSIS — D509 Iron deficiency anemia, unspecified: Secondary | ICD-10-CM | POA: Diagnosis not present

## 2016-12-10 DIAGNOSIS — N2581 Secondary hyperparathyroidism of renal origin: Secondary | ICD-10-CM | POA: Diagnosis not present

## 2016-12-10 DIAGNOSIS — N186 End stage renal disease: Secondary | ICD-10-CM | POA: Diagnosis not present

## 2016-12-12 DIAGNOSIS — D631 Anemia in chronic kidney disease: Secondary | ICD-10-CM | POA: Diagnosis not present

## 2016-12-12 DIAGNOSIS — N186 End stage renal disease: Secondary | ICD-10-CM | POA: Diagnosis not present

## 2016-12-12 DIAGNOSIS — N2581 Secondary hyperparathyroidism of renal origin: Secondary | ICD-10-CM | POA: Diagnosis not present

## 2016-12-12 DIAGNOSIS — D509 Iron deficiency anemia, unspecified: Secondary | ICD-10-CM | POA: Diagnosis not present

## 2016-12-12 DIAGNOSIS — Z992 Dependence on renal dialysis: Secondary | ICD-10-CM | POA: Diagnosis not present

## 2016-12-15 DIAGNOSIS — N2581 Secondary hyperparathyroidism of renal origin: Secondary | ICD-10-CM | POA: Diagnosis not present

## 2016-12-15 DIAGNOSIS — N186 End stage renal disease: Secondary | ICD-10-CM | POA: Diagnosis not present

## 2016-12-15 DIAGNOSIS — E119 Type 2 diabetes mellitus without complications: Secondary | ICD-10-CM | POA: Diagnosis not present

## 2016-12-15 DIAGNOSIS — Z992 Dependence on renal dialysis: Secondary | ICD-10-CM | POA: Diagnosis not present

## 2016-12-15 DIAGNOSIS — D631 Anemia in chronic kidney disease: Secondary | ICD-10-CM | POA: Diagnosis not present

## 2016-12-15 DIAGNOSIS — D509 Iron deficiency anemia, unspecified: Secondary | ICD-10-CM | POA: Diagnosis not present

## 2016-12-16 DIAGNOSIS — M5416 Radiculopathy, lumbar region: Secondary | ICD-10-CM | POA: Diagnosis not present

## 2016-12-16 DIAGNOSIS — M5136 Other intervertebral disc degeneration, lumbar region: Secondary | ICD-10-CM | POA: Diagnosis not present

## 2016-12-17 DIAGNOSIS — D631 Anemia in chronic kidney disease: Secondary | ICD-10-CM | POA: Diagnosis not present

## 2016-12-17 DIAGNOSIS — N186 End stage renal disease: Secondary | ICD-10-CM | POA: Diagnosis not present

## 2016-12-17 DIAGNOSIS — D509 Iron deficiency anemia, unspecified: Secondary | ICD-10-CM | POA: Diagnosis not present

## 2016-12-17 DIAGNOSIS — Z992 Dependence on renal dialysis: Secondary | ICD-10-CM | POA: Diagnosis not present

## 2016-12-17 DIAGNOSIS — N2581 Secondary hyperparathyroidism of renal origin: Secondary | ICD-10-CM | POA: Diagnosis not present

## 2016-12-19 DIAGNOSIS — D509 Iron deficiency anemia, unspecified: Secondary | ICD-10-CM | POA: Diagnosis not present

## 2016-12-19 DIAGNOSIS — D631 Anemia in chronic kidney disease: Secondary | ICD-10-CM | POA: Diagnosis not present

## 2016-12-19 DIAGNOSIS — N2581 Secondary hyperparathyroidism of renal origin: Secondary | ICD-10-CM | POA: Diagnosis not present

## 2016-12-19 DIAGNOSIS — Z992 Dependence on renal dialysis: Secondary | ICD-10-CM | POA: Diagnosis not present

## 2016-12-19 DIAGNOSIS — N186 End stage renal disease: Secondary | ICD-10-CM | POA: Diagnosis not present

## 2016-12-20 DIAGNOSIS — D631 Anemia in chronic kidney disease: Secondary | ICD-10-CM | POA: Diagnosis not present

## 2016-12-20 DIAGNOSIS — D509 Iron deficiency anemia, unspecified: Secondary | ICD-10-CM | POA: Diagnosis not present

## 2016-12-20 DIAGNOSIS — N2581 Secondary hyperparathyroidism of renal origin: Secondary | ICD-10-CM | POA: Diagnosis not present

## 2016-12-20 DIAGNOSIS — N186 End stage renal disease: Secondary | ICD-10-CM | POA: Diagnosis not present

## 2016-12-20 DIAGNOSIS — Z992 Dependence on renal dialysis: Secondary | ICD-10-CM | POA: Diagnosis not present

## 2016-12-22 ENCOUNTER — Ambulatory Visit: Payer: Medicare Other | Admitting: Podiatry

## 2016-12-22 DIAGNOSIS — N186 End stage renal disease: Secondary | ICD-10-CM | POA: Diagnosis not present

## 2016-12-22 DIAGNOSIS — N2581 Secondary hyperparathyroidism of renal origin: Secondary | ICD-10-CM | POA: Diagnosis not present

## 2016-12-22 DIAGNOSIS — D509 Iron deficiency anemia, unspecified: Secondary | ICD-10-CM | POA: Diagnosis not present

## 2016-12-22 DIAGNOSIS — Z992 Dependence on renal dialysis: Secondary | ICD-10-CM | POA: Diagnosis not present

## 2016-12-22 DIAGNOSIS — D631 Anemia in chronic kidney disease: Secondary | ICD-10-CM | POA: Diagnosis not present

## 2016-12-24 DIAGNOSIS — N2581 Secondary hyperparathyroidism of renal origin: Secondary | ICD-10-CM | POA: Diagnosis not present

## 2016-12-24 DIAGNOSIS — Z992 Dependence on renal dialysis: Secondary | ICD-10-CM | POA: Diagnosis not present

## 2016-12-24 DIAGNOSIS — D631 Anemia in chronic kidney disease: Secondary | ICD-10-CM | POA: Diagnosis not present

## 2016-12-24 DIAGNOSIS — N186 End stage renal disease: Secondary | ICD-10-CM | POA: Diagnosis not present

## 2016-12-24 DIAGNOSIS — D509 Iron deficiency anemia, unspecified: Secondary | ICD-10-CM | POA: Diagnosis not present

## 2016-12-25 ENCOUNTER — Ambulatory Visit (INDEPENDENT_AMBULATORY_CARE_PROVIDER_SITE_OTHER): Payer: Medicare Other | Admitting: Podiatry

## 2016-12-25 ENCOUNTER — Ambulatory Visit (INDEPENDENT_AMBULATORY_CARE_PROVIDER_SITE_OTHER): Payer: Medicare Other

## 2016-12-25 ENCOUNTER — Other Ambulatory Visit (INDEPENDENT_AMBULATORY_CARE_PROVIDER_SITE_OTHER): Payer: Self-pay | Admitting: Vascular Surgery

## 2016-12-25 ENCOUNTER — Ambulatory Visit (INDEPENDENT_AMBULATORY_CARE_PROVIDER_SITE_OTHER): Payer: Medicare Other | Admitting: Vascular Surgery

## 2016-12-25 ENCOUNTER — Encounter (INDEPENDENT_AMBULATORY_CARE_PROVIDER_SITE_OTHER): Payer: Self-pay | Admitting: Vascular Surgery

## 2016-12-25 VITALS — BP 157/64 | HR 67 | Resp 15 | Ht 67.5 in | Wt 170.0 lb

## 2016-12-25 DIAGNOSIS — E1122 Type 2 diabetes mellitus with diabetic chronic kidney disease: Secondary | ICD-10-CM | POA: Diagnosis not present

## 2016-12-25 DIAGNOSIS — I2510 Atherosclerotic heart disease of native coronary artery without angina pectoris: Secondary | ICD-10-CM | POA: Diagnosis not present

## 2016-12-25 DIAGNOSIS — E7849 Other hyperlipidemia: Secondary | ICD-10-CM

## 2016-12-25 DIAGNOSIS — T829XXS Unspecified complication of cardiac and vascular prosthetic device, implant and graft, sequela: Secondary | ICD-10-CM | POA: Diagnosis not present

## 2016-12-25 DIAGNOSIS — N185 Chronic kidney disease, stage 5: Secondary | ICD-10-CM

## 2016-12-25 DIAGNOSIS — M79609 Pain in unspecified limb: Secondary | ICD-10-CM

## 2016-12-25 DIAGNOSIS — N186 End stage renal disease: Secondary | ICD-10-CM | POA: Diagnosis not present

## 2016-12-25 DIAGNOSIS — E1140 Type 2 diabetes mellitus with diabetic neuropathy, unspecified: Secondary | ICD-10-CM

## 2016-12-25 DIAGNOSIS — Z992 Dependence on renal dialysis: Secondary | ICD-10-CM

## 2016-12-25 DIAGNOSIS — Z794 Long term (current) use of insulin: Secondary | ICD-10-CM

## 2016-12-25 DIAGNOSIS — E784 Other hyperlipidemia: Secondary | ICD-10-CM

## 2016-12-25 DIAGNOSIS — B351 Tinea unguium: Secondary | ICD-10-CM | POA: Diagnosis not present

## 2016-12-25 NOTE — Progress Notes (Signed)
Complaint:  Visit Type: Patient returns to my office for continued preventative foot care services. Complaint: Patient states" my nails have grown long and thick and become painful to walk and wear shoes" Patient has been diagnosed with DM with no foot complications. The patient presents for preventative foot care services. No changes to ROS  Podiatric Exam: Vascular: dorsalis pedis and posterior tibial pulses are palpable bilateral. Capillary return is immediate. Temperature gradient is WNL. Skin turgor WNL  Sensorium: Normal Semmes Weinstein monofilament test. Normal tactile sensation bilaterally. Nail Exam: Pt has thick disfigured discolored nails with subungual debris noted bilateral entire nail hallux through fifth toenails.  Pincer nails  B/L Ulcer Exam: There is no evidence of ulcer or pre-ulcerative changes or infection. Orthopedic Exam: Muscle tone and strength are WNL. No limitations in general ROM. No crepitus or effusions noted. Foot type and digits show no abnormalities. Bony prominences are unremarkable. Skin: No Porokeratosis. No infection or ulcers  Diagnosis:  Onychomycosis, , Pain in right toe, pain in left toes  Treatment & Plan Procedures and Treatment: Consent by patient was obtained for treatment procedures. The patient understood the discussion of treatment and procedures well. All questions were answered thoroughly reviewed. Debridement of mycotic and hypertrophic toenails, 1 through 5 bilateral and clearing of subungual debris. No ulceration, no infection noted.  Return Visit-Office Procedure: Patient instructed to return to the office for a follow up visit 3 months for continued evaluation and treatment.    Gardiner Barefoot DPM

## 2016-12-26 DIAGNOSIS — N2581 Secondary hyperparathyroidism of renal origin: Secondary | ICD-10-CM | POA: Diagnosis not present

## 2016-12-26 DIAGNOSIS — D509 Iron deficiency anemia, unspecified: Secondary | ICD-10-CM | POA: Diagnosis not present

## 2016-12-26 DIAGNOSIS — N186 End stage renal disease: Secondary | ICD-10-CM | POA: Diagnosis not present

## 2016-12-26 DIAGNOSIS — D631 Anemia in chronic kidney disease: Secondary | ICD-10-CM | POA: Diagnosis not present

## 2016-12-26 DIAGNOSIS — Z992 Dependence on renal dialysis: Secondary | ICD-10-CM | POA: Diagnosis not present

## 2016-12-28 NOTE — Progress Notes (Signed)
MRN : [id number]  Theresa [name] is a 61 y.o. (January 09, 1956) female who presents with chief complaint of  Chief Complaint  Patient presents with  . Re-evaluation    HDA Left graft follow up  .  History of Present Illness:   The patient returns to the office for followup of their dialysis access. The function of the access has been stable. The patient denies increased bleeding time or increased recirculation. Patient denies difficulty with cannulation. The patient denies hand pain or other symptoms consistent with steal phenomena.  No significant arm swelling.  The patient denies redness or swelling at the access site. The patient denies fever or chills at home or while on dialysis.  The patient denies amaurosis fugax or recent TIA symptoms. There are no recent neurological changes noted. The patient denies claudication symptoms or rest pain symptoms. The patient denies history of DVT, PE or superficial thrombophlebitis. The patient denies recent episodes of angina or shortness of breath.        Current Meds  Medication Sig  . albuterol (PROVENTIL HFA;VENTOLIN HFA) 108 (90 BASE) MCG/ACT inhaler Inhale 2 puffs into the lungs every 6 (six) hours as needed for wheezing or shortness of breath.  Marland Kitchen albuterol (PROVENTIL) (2.5 MG/3ML) 0.083% nebulizer solution Take 2.5 mg by nebulization every 4 (four) hours as needed for wheezing or shortness of breath.  Marland Kitchen amLODipine (NORVASC) 10 MG tablet   . aspirin EC 81 MG tablet Take 81 mg by mouth daily.  . carvedilol (COREG) 12.5 MG tablet   . cloNIDine (CATAPRES) 0.1 MG tablet Take 0.1 mg by mouth 2 (two) times daily.  . furosemide (LASIX) 80 MG tablet Take 80 mg by mouth daily.   Marland Kitchen losartan (COZAAR) 50 MG tablet   . Melatonin 1 MG CAPS Take 10 mg by mouth daily.   . multivitamin (RENA-VIT) TABS tablet TAKE ONE TABLET BY MOUTH ONCE DAILY  . naproxen sodium (ANAPROX) 220 MG tablet Take 220 mg by mouth daily as needed.  . nitroGLYCERIN (NITROSTAT)  0.4 MG SL tablet Place 1 tablet (0.4 mg total) under the tongue every 5 (five) minutes as needed for chest pain.  Marland Kitchen oxyCODONE-acetaminophen (PERCOCET/ROXICET) 5-325 MG tablet Take 1 tablet by mouth daily as needed for severe pain.   . pregabalin (LYRICA) 75 MG capsule Take 1 capsule (75 mg total) by mouth daily.   Current Facility-Administered Medications for the 12/25/16 encounter (Office Visit) with Delana Meyer, Dolores Lory, MD  Medication  . betamethasone acetate-betamethasone sodium phosphate (CELESTONE) injection 3 mg    Past Medical History:  Diagnosis Date  . Anemia of chronic disease   . Carotid arterial disease ([place])    a. 02/2013 U/S: 40-59% bilat ICA stenosis - *f/u 02/2014*  . Chronic constipation   . Chronic diastolic CHF (congestive heart failure) ([place])    a. 10/2013 Echo [facility]): EF 55-60%, mod conc LVH, mod MR, mildly dil LA, mild Ao sclerosis w/o stenosis.  . Colon polyps   . COPD (chronic obstructive pulmonary disease) ([place])   . Coronary artery disease    a. 05/2013 NSTEMI/PCI: LM 20d, LAD min irregs, LCX small, nl, OM1 nl, RCA dom 43m (2.5x16 Promus DES), PDA1 80p.  . Diabetes mellitus   . Diabetic neuropathy ([place])   . Diabetic retinopathy ([place]) 05/28/2013   Hx bilat retinal detachment, proliferative diab retinopathy and bilat vitreous hemorrhage   . Emphysema   . ESRD on hemodialysis ([place])    a. [facility] in [place], [place], on a  TTS schedule.  She started dialysis in Feb 2014.  Etiology of renal failure not known, likely diabetes.  Has a left upper arm AV graft.  . History of bronchitis    Mar 2012  . History of pneumonia    June 2012  . History of tobacco abuse    a. Quit 2012.  Marland Kitchen Hyperlipidemia   . Hypertension   . Moderate mitral insufficiency    a. 10/2013 Echo: EF 55-60%, mod MR.  . Myocardial infarct ([place]) 05/2013  . Peripheral vascular disease ([place])   . Sickle cell trait [facility])     Past Surgical History:  Procedure Laterality Date  . ABDOMINAL HYSTERECTOMY      2000  . CARDIAC CATHETERIZATION    . CARDIAC CATHETERIZATION N/A 05/02/2015   Procedure: Left Heart Cath and Coronary Angiography;  Surgeon: [name], MD;  Location: [place] CV LAB;  Service: Cardiovascular;  Laterality: N/A;  . COLONOSCOPY WITH PROPOFOL N/A 07/08/2016   Procedure: COLONOSCOPY WITH PROPOFOL;  Surgeon: [name], MD;  Location: [facility] ENDOSCOPY;  Service: Endoscopy;  Laterality: N/A;  . colonscopy    . CORONARY ANGIOPLASTY  05/28/2014   stent placement to the mid RCA  . DILATION AND CURETTAGE OF UTERUS     several in the early 80's  . ESOPHAGOGASTRODUODENOSCOPY     2012  . EYE SURGERY     bilateral laser 2012  . EYE SURGERY     right  . GAS INSERTION  09/30/2011   Procedure: INSERTION OF GAS;  Surgeon: [name], MD;  Location: [place];  Service: Ophthalmology;  Laterality: Right;  C3F8  . GAS/FLUID EXCHANGE  09/30/2011   Procedure: GAS/FLUID EXCHANGE;  Surgeon: [name], MD;  Location: [place];  Service: Ophthalmology;  Laterality: Right;  . LEFT HEART CATHETERIZATION WITH CORONARY ANGIOGRAM N/A 05/28/2013   Procedure: LEFT HEART CATHETERIZATION WITH CORONARY ANGIOGRAM;  Surgeon: [name], MD;  Location: [facility] CATH LAB;  Service: Cardiovascular;  Laterality: N/A;  . PARS PLANA VITRECTOMY  04/22/2011   Procedure: PARS PLANA VITRECTOMY WITH 25 GAUGE;  Surgeon: [name], MD;  Location: [place];  Service: Ophthalmology;  Laterality: Left;  membrane peel, endolaser, gas fluid exchange, silicone oil, repair of complex traction retinal detachment  . PARS PLANA VITRECTOMY  09/30/2011   Procedure: PARS PLANA VITRECTOMY WITH 25 GAUGE;  Surgeon: [name], MD;  Location: [place];  Service: Ophthalmology;  Laterality: Right;  Endolaser; Repair of Complex Traction Retinal Detachment  . PARS PLANA VITRECTOMY  02/24/2012   Procedure: PARS PLANA VITRECTOMY WITH 25 GAUGE;  Surgeon: [name], MD;  Location: [place];  Service: Ophthalmology;   Laterality: Left;  . PTCA    . SILICON OIL REMOVAL  2/58/5277   Procedure: SILICON OIL REMOVAL;  Surgeon: [name], MD;  Location: [place];  Service: Ophthalmology;  Laterality: Left;  . THROMBECTOMY / ARTERIOVENOUS GRAFT REVISION    . TUBAL LIGATION     1979    Social History Social History  Substance Use Topics  . Smoking status: Former Smoker    Packs/day: 1.00    Years: 25.00    Quit date: 06/09/2010  . Smokeless tobacco: Never Used  . Alcohol use No    Family History Family History  Problem Relation Age of Onset  . Stroke Mother   . Heart attack Mother   . Heart disease Mother   . Colon cancer Father   . Colon cancer  Sister   . Heart attack Brother   . Stroke Brother   . Diabetes Brother   . Breast cancer Sister   . Diabetes Brother   . Diabetes Daughter   . Anesthesia problems Neg Hx   . Hypotension Neg Hx   . Malignant hyperthermia Neg Hx   . Pseudochol deficiency Neg Hx     Allergies  Allergen Reactions  . Hydrocodone Hives  . Metformin Diarrhea     REVIEW OF SYSTEMS (Negative unless checked)  Constitutional: [] Weight loss  [] Fever  [] Chills Cardiac: [] Chest pain   [] Chest pressure   [] Palpitations   [] Shortness of breath when laying flat   [] Shortness of breath with exertion. Vascular:  [] Pain in legs with walking   [] Pain in legs at rest  [] History of DVT   [] Phlebitis   [] Swelling in legs   [] Varicose veins   [] Non-healing ulcers Pulmonary:   [] Uses home oxygen   [] Productive cough   [] Hemoptysis   [] Wheeze  [] COPD   [] Asthma Neurologic:  [] Dizziness   [] Seizures   [] History of stroke   [] History of TIA  [] Aphasia   [] Vissual changes   [] Weakness or numbness in arm   [] Weakness or numbness in leg Musculoskeletal:   [] Joint swelling   [] Joint pain   [] Low back pain Hematologic:  [] Easy bruising  [] Easy bleeding   [] Hypercoagulable state   [] Anemic Gastrointestinal:  [] Diarrhea   [] Vomiting  [] Gastroesophageal reflux/heartburn   [] Difficulty  swallowing. Genitourinary:  [x] Chronic kidney disease   [] Difficult urination  [] Frequent urination   [] Blood in urine Skin:  [] Rashes   [] Ulcers  Psychological:  [] History of anxiety   []  History of major depression.  Physical Examination  Vitals:   12/25/16 1430  BP: (!) 157/64  Pulse: 67  Resp: 15  Weight: 170 lb (77.1 kg)  Height: 5' 7.5" (1.715 m)   Body mass index is 26.23 kg/m. Gen: WD/WN, NAD Head: [place]/AT, No temporalis wasting.  Ear/Nose/Throat: Hearing grossly intact, nares w/o erythema or drainage Eyes: PER, EOMI, sclera nonicteric.  Neck: Supple, no large masses.   Pulmonary:  Good air movement, no audible wheezing bilaterally, no use of accessory muscles.  Cardiac: RRR, no JVD Vascular: left forearm loop graft moderate pulsatility Vessel Right Left  Radial Not Palpable Not Palpable  Ulnar Not Palpable Not Palpable  Brachial Palpable Palpable  Carotid Palpable Palpable  Gastrointestinal: Non-distended. No guarding/no peritoneal signs.  Musculoskeletal: M/S 5/5 throughout.  No deformity or atrophy.  Neurologic: CN 2-12 intact. Symmetrical.  Speech is fluent. Motor exam as listed above. Psychiatric: Judgment intact, Mood & affect appropriate for pt's clinical situation. Dermatologic: No rashes or ulcers noted.  No changes consistent with cellulitis. Lymph : No lichenification or skin changes of chronic lymphedema.  CBC Lab Results  Component Value Date   WBC 7.5 05/21/2016   HGB 11.5 (L) 05/21/2016   HCT 33.6 (L) 05/21/2016   MCV 99.6 05/21/2016   PLT 155 05/21/2016    BMET    Component Value Date/Time   NA 138 05/21/2016 1405   NA 143 07/27/2015 1737   NA 137 05/14/2014 0741   K 4.5 07/09/2016 1158   K 3.3 (L) 05/14/2014 1500   CL 98 (L) 05/21/2016 1405   CL 98 05/14/2014 0741   CO2 31 05/21/2016 1405   CO2 35 (H) 05/14/2014 0741   GLUCOSE 232 (H) 05/21/2016 1405   GLUCOSE 96 05/14/2014 0741   BUN 27 (H) 05/21/2016 1405   BUN 25 (H) 05/14/2014  0741   CREATININE 4.98 (H) 05/21/2016 1405   CREATININE 5.83 (H) 05/14/2014 0741   CALCIUM 8.4 (L) 05/21/2016 1405   CALCIUM 8.9 05/14/2014 0741   GFRNONAA 9 (L) 05/21/2016 1405   GFRNONAA 8 (L) 05/14/2014 0741   GFRNONAA 4 (L) 02/15/2014 0837   GFRAA 10 (L) 05/21/2016 1405   GFRAA 10 (L) 05/14/2014 0741   GFRAA 5 (L) 02/15/2014 0837   CrCl cannot be calculated (Patient's most recent lab result is older than the maximum 21 days allowed.).  COAG Lab Results  Component Value Date   INR 1.17 05/02/2015   INR 1.0 10/10/2013   INR 1.0 05/12/2013    Radiology No results found.  Assessment/Plan 1. ESRD on hemodialysis [facility]) Recommend:  The patient is doing well and currently has adequate dialysis access. The patient's dialysis center is not reporting any access issues. Flow pattern is stable when compared to the prior ultrasound.  The patient should have a duplex ultrasound of the dialysis access in 3 months. The patient will follow-up with me in the office after each ultrasound   - VAS [place] (AVF,AVG); Future  2. Complication of vascular access for dialysis, sequela See #1  - VAS US DUPLEX DIALYSIS ACCESS (AVF,AVG); Future  3. Other hyperlipidemia Continue statin as ordered and reviewed, no changes at this time   4. Type 2 diabetes mellitus with chronic kidney disease on chronic dialysis, with long-term current use of insulin (HCC) Continue hypoglycemic medications as already ordered, these medications have been reviewed and there are no changes at this time.  Hgb A1C to be monitored as already arranged by primary service   5. Coronary artery disease involving native coronary artery of native heart without angina pectoris Continue cardiac and antihypertensive medications as already ordered and reviewed, no changes at this time.  Continue statin as ordered and reviewed, no changes at this time  Nitrates PRN for chest pain     Hortencia Pilar, MD  12/28/2016 2:26 PM

## 2016-12-29 ENCOUNTER — Other Ambulatory Visit: Payer: Self-pay | Admitting: Physical Medicine and Rehabilitation

## 2016-12-29 DIAGNOSIS — M5416 Radiculopathy, lumbar region: Secondary | ICD-10-CM

## 2016-12-29 DIAGNOSIS — Z992 Dependence on renal dialysis: Secondary | ICD-10-CM | POA: Diagnosis not present

## 2016-12-29 DIAGNOSIS — D631 Anemia in chronic kidney disease: Secondary | ICD-10-CM | POA: Diagnosis not present

## 2016-12-29 DIAGNOSIS — N2581 Secondary hyperparathyroidism of renal origin: Secondary | ICD-10-CM | POA: Diagnosis not present

## 2016-12-29 DIAGNOSIS — N186 End stage renal disease: Secondary | ICD-10-CM | POA: Diagnosis not present

## 2016-12-29 DIAGNOSIS — D509 Iron deficiency anemia, unspecified: Secondary | ICD-10-CM | POA: Diagnosis not present

## 2016-12-31 DIAGNOSIS — N2581 Secondary hyperparathyroidism of renal origin: Secondary | ICD-10-CM | POA: Diagnosis not present

## 2016-12-31 DIAGNOSIS — Z992 Dependence on renal dialysis: Secondary | ICD-10-CM | POA: Diagnosis not present

## 2016-12-31 DIAGNOSIS — D631 Anemia in chronic kidney disease: Secondary | ICD-10-CM | POA: Diagnosis not present

## 2016-12-31 DIAGNOSIS — N186 End stage renal disease: Secondary | ICD-10-CM | POA: Diagnosis not present

## 2016-12-31 DIAGNOSIS — D509 Iron deficiency anemia, unspecified: Secondary | ICD-10-CM | POA: Diagnosis not present

## 2017-01-02 DIAGNOSIS — Z992 Dependence on renal dialysis: Secondary | ICD-10-CM | POA: Diagnosis not present

## 2017-01-02 DIAGNOSIS — D631 Anemia in chronic kidney disease: Secondary | ICD-10-CM | POA: Diagnosis not present

## 2017-01-02 DIAGNOSIS — D509 Iron deficiency anemia, unspecified: Secondary | ICD-10-CM | POA: Diagnosis not present

## 2017-01-02 DIAGNOSIS — N186 End stage renal disease: Secondary | ICD-10-CM | POA: Diagnosis not present

## 2017-01-02 DIAGNOSIS — N2581 Secondary hyperparathyroidism of renal origin: Secondary | ICD-10-CM | POA: Diagnosis not present

## 2017-01-05 DIAGNOSIS — D509 Iron deficiency anemia, unspecified: Secondary | ICD-10-CM | POA: Diagnosis not present

## 2017-01-05 DIAGNOSIS — N2581 Secondary hyperparathyroidism of renal origin: Secondary | ICD-10-CM | POA: Diagnosis not present

## 2017-01-05 DIAGNOSIS — D631 Anemia in chronic kidney disease: Secondary | ICD-10-CM | POA: Diagnosis not present

## 2017-01-05 DIAGNOSIS — N186 End stage renal disease: Secondary | ICD-10-CM | POA: Diagnosis not present

## 2017-01-05 DIAGNOSIS — Z992 Dependence on renal dialysis: Secondary | ICD-10-CM | POA: Diagnosis not present

## 2017-01-06 ENCOUNTER — Telehealth: Payer: Self-pay | Admitting: Family

## 2017-01-06 DIAGNOSIS — N186 End stage renal disease: Secondary | ICD-10-CM | POA: Diagnosis not present

## 2017-01-06 DIAGNOSIS — Z992 Dependence on renal dialysis: Secondary | ICD-10-CM | POA: Diagnosis not present

## 2017-01-06 NOTE — Telephone Encounter (Signed)
Left pt message asking to call Ebony Hail back directly at [phone] to schedule AWV. Thanks!  NOTE* Never had AWV before

## 2017-01-07 ENCOUNTER — Ambulatory Visit
Admission: RE | Admit: 2017-01-07 | Discharge: 2017-01-07 | Disposition: A | Discharge status: Home / Self Care | Payer: Medicare Other | Source: Ambulatory Visit | Attending: Physical Medicine and Rehabilitation | Admitting: Physical Medicine and Rehabilitation

## 2017-01-07 DIAGNOSIS — N186 End stage renal disease: Secondary | ICD-10-CM | POA: Diagnosis not present

## 2017-01-07 DIAGNOSIS — M5127 Other intervertebral disc displacement, lumbosacral region: Secondary | ICD-10-CM | POA: Insufficient documentation

## 2017-01-07 DIAGNOSIS — M545 Low back pain: Secondary | ICD-10-CM | POA: Diagnosis not present

## 2017-01-07 DIAGNOSIS — M5137 Other intervertebral disc degeneration, lumbosacral region: Secondary | ICD-10-CM | POA: Insufficient documentation

## 2017-01-07 DIAGNOSIS — M5416 Radiculopathy, lumbar region: Secondary | ICD-10-CM | POA: Diagnosis not present

## 2017-01-07 DIAGNOSIS — Z992 Dependence on renal dialysis: Secondary | ICD-10-CM | POA: Diagnosis not present

## 2017-01-07 DIAGNOSIS — N2581 Secondary hyperparathyroidism of renal origin: Secondary | ICD-10-CM | POA: Diagnosis not present

## 2017-01-07 DIAGNOSIS — D631 Anemia in chronic kidney disease: Secondary | ICD-10-CM | POA: Diagnosis not present

## 2017-01-07 DIAGNOSIS — D509 Iron deficiency anemia, unspecified: Secondary | ICD-10-CM | POA: Diagnosis not present

## 2017-01-09 DIAGNOSIS — N2581 Secondary hyperparathyroidism of renal origin: Secondary | ICD-10-CM | POA: Diagnosis not present

## 2017-01-09 DIAGNOSIS — D509 Iron deficiency anemia, unspecified: Secondary | ICD-10-CM | POA: Diagnosis not present

## 2017-01-09 DIAGNOSIS — N186 End stage renal disease: Secondary | ICD-10-CM | POA: Diagnosis not present

## 2017-01-09 DIAGNOSIS — Z992 Dependence on renal dialysis: Secondary | ICD-10-CM | POA: Diagnosis not present

## 2017-01-09 DIAGNOSIS — D631 Anemia in chronic kidney disease: Secondary | ICD-10-CM | POA: Diagnosis not present

## 2017-01-12 DIAGNOSIS — N186 End stage renal disease: Secondary | ICD-10-CM | POA: Diagnosis not present

## 2017-01-12 DIAGNOSIS — N2581 Secondary hyperparathyroidism of renal origin: Secondary | ICD-10-CM | POA: Diagnosis not present

## 2017-01-12 DIAGNOSIS — D631 Anemia in chronic kidney disease: Secondary | ICD-10-CM | POA: Diagnosis not present

## 2017-01-12 DIAGNOSIS — Z992 Dependence on renal dialysis: Secondary | ICD-10-CM | POA: Diagnosis not present

## 2017-01-12 DIAGNOSIS — D509 Iron deficiency anemia, unspecified: Secondary | ICD-10-CM | POA: Diagnosis not present

## 2017-01-13 ENCOUNTER — Ambulatory Visit
Admission: RE | Admit: 2017-01-13 | Discharge: 2017-01-13 | Disposition: A | Discharge status: Home / Self Care | Payer: MEDICARE

## 2017-01-13 DIAGNOSIS — Z1231 Encounter for screening mammogram for malignant neoplasm of breast: Principal | ICD-10-CM

## 2017-01-13 DIAGNOSIS — Z9289 Personal history of other medical treatment: Secondary | ICD-10-CM

## 2017-01-14 DIAGNOSIS — N186 End stage renal disease: Secondary | ICD-10-CM | POA: Diagnosis not present

## 2017-01-14 DIAGNOSIS — N2581 Secondary hyperparathyroidism of renal origin: Secondary | ICD-10-CM | POA: Diagnosis not present

## 2017-01-14 DIAGNOSIS — Z992 Dependence on renal dialysis: Secondary | ICD-10-CM | POA: Diagnosis not present

## 2017-01-14 DIAGNOSIS — D509 Iron deficiency anemia, unspecified: Secondary | ICD-10-CM | POA: Diagnosis not present

## 2017-01-14 DIAGNOSIS — D631 Anemia in chronic kidney disease: Secondary | ICD-10-CM | POA: Diagnosis not present

## 2017-01-15 ENCOUNTER — Ambulatory Visit (INDEPENDENT_AMBULATORY_CARE_PROVIDER_SITE_OTHER): Payer: Medicare Other | Admitting: Family

## 2017-01-15 ENCOUNTER — Encounter: Payer: Self-pay | Admitting: Family

## 2017-01-15 VITALS — BP 128/46 | HR 70 | Temp 98.1°F | Ht 67.5 in | Wt 171.2 lb

## 2017-01-15 DIAGNOSIS — I2510 Atherosclerotic heart disease of native coronary artery without angina pectoris: Secondary | ICD-10-CM | POA: Diagnosis not present

## 2017-01-15 DIAGNOSIS — E1122 Type 2 diabetes mellitus with diabetic chronic kidney disease: Secondary | ICD-10-CM | POA: Diagnosis not present

## 2017-01-15 DIAGNOSIS — N186 End stage renal disease: Secondary | ICD-10-CM | POA: Diagnosis not present

## 2017-01-15 DIAGNOSIS — I10 Essential (primary) hypertension: Secondary | ICD-10-CM | POA: Diagnosis not present

## 2017-01-15 DIAGNOSIS — Z794 Long term (current) use of insulin: Secondary | ICD-10-CM | POA: Diagnosis not present

## 2017-01-15 DIAGNOSIS — I340 Nonrheumatic mitral (valve) insufficiency: Secondary | ICD-10-CM | POA: Diagnosis not present

## 2017-01-15 DIAGNOSIS — I5032 Chronic diastolic (congestive) heart failure: Secondary | ICD-10-CM | POA: Diagnosis not present

## 2017-01-15 DIAGNOSIS — Z992 Dependence on renal dialysis: Secondary | ICD-10-CM | POA: Diagnosis not present

## 2017-01-15 MED ORDER — NITROGLYCERIN 0.4 MG SL SUBL: 0.4000 mg | SUBLINGUAL_TABLET | SUBLINGUAL | 3 refills | Status: DC | PRN

## 2017-01-15 NOTE — Patient Instructions (Addendum)
Please have a1c drawn at lab or if you have a more recent a1c- call me with number as well date it was drawn.   Ensure you speak with [name] about your mitral valve regurgitation and how it impacts kidney transplant.   Pleasure seeing you always

## 2017-01-15 NOTE — Progress Notes (Signed)
Subjective:    Patient ID: Theresa [name], female    DOB: 1956/01/30, 61 y.o.   MRN: [id number]  CC: [name] is a 61 y.o. female who presents today for follow up.   HPI: Doing well today. No complaints.  DM-Compliant with medications. No hypoglycemic episodes. Last a1c 6.9  HTN and HF- takes lasix QOD. hasn't had to use nitro- would like refill as had expired. Denies exertional chest pain or pressure, numbness or tingling radiating to left arm or jaw, palpitations, dizziness, frequent headaches, changes in vision, or shortness of breath. Follows with [name]  ESRD- [name] 12/2016- dialysis access; follows with [name]   Echo-9/ 2017- moderate MVR. Normal EF. Advised to repeat in 6 months.    Cardiology - [place], [place]; hasn't seen Rockey Situ, needs follow up  HISTORY:  Past Medical History:  Diagnosis Date  . Anemia of chronic disease   . Carotid arterial disease ([place])    a. 02/2013 U/S: 40-59% bilat ICA stenosis - *f/u 02/2014*  . Chronic constipation   . Chronic diastolic CHF (congestive heart failure) ([place])    a. 10/2013 Echo [facility]): EF 55-60%, mod conc LVH, mod MR, mildly dil LA, mild Ao sclerosis w/o stenosis.  . Colon polyps   . COPD (chronic obstructive pulmonary disease) ([place])   . Coronary artery disease    a. 05/2013 NSTEMI/PCI: LM 20d, LAD min irregs, LCX small, nl, OM1 nl, RCA dom 85m (2.5x16 Promus DES), PDA1 80p.  . Diabetes mellitus   . Diabetic neuropathy ([place])   . Diabetic retinopathy ([place]) 05/28/2013   Hx bilat retinal detachment, proliferative diab retinopathy and bilat vitreous hemorrhage   . Emphysema   . ESRD on hemodialysis ([place])    a. [facility] in [place], [place], on a TTS schedule.  She started dialysis in Feb 2014.  Etiology of renal failure not known, likely diabetes.  Has a left upper arm AV graft.  . History of bronchitis    Mar 2012  . History of pneumonia    June 2012  . History of tobacco abuse    a. Quit 2012.  Marland Kitchen Hyperlipidemia   .  Hypertension   . Moderate mitral insufficiency    a. 10/2013 Echo: EF 55-60%, mod MR.  . Myocardial infarct ([place]) 05/2013  . Peripheral vascular disease ([place])   . Sickle cell trait [facility])    Past Surgical History:  Procedure Laterality Date  . ABDOMINAL HYSTERECTOMY     2000  . CARDIAC CATHETERIZATION    . CARDIAC CATHETERIZATION N/A 05/02/2015   Procedure: Left Heart Cath and Coronary Angiography;  Surgeon: [name], MD;  Location: [place] CV LAB;  Service: Cardiovascular;  Laterality: N/A;  . COLONOSCOPY WITH PROPOFOL N/A 07/08/2016   Procedure: COLONOSCOPY WITH PROPOFOL;  Surgeon: [name], MD;  Location: [facility] ENDOSCOPY;  Service: Endoscopy;  Laterality: N/A;  . colonscopy    . CORONARY ANGIOPLASTY  05/28/2014   stent placement to the mid RCA  . DILATION AND CURETTAGE OF UTERUS     several in the early 80's  . ESOPHAGOGASTRODUODENOSCOPY     2012  . EYE SURGERY     bilateral laser 2012  . EYE SURGERY     right  . GAS INSERTION  09/30/2011   Procedure: INSERTION OF GAS;  Surgeon: [name], MD;  Location: [place];  Service: Ophthalmology;  Laterality: Right;  C3F8  . GAS/FLUID EXCHANGE  09/30/2011   Procedure: GAS/FLUID EXCHANGE;  Surgeon: Jenny Reichmann  Eber Jones, MD;  Location: [place];  Service: Ophthalmology;  Laterality: Right;  . LEFT HEART CATHETERIZATION WITH CORONARY ANGIOGRAM N/A 05/28/2013   Procedure: LEFT HEART CATHETERIZATION WITH CORONARY ANGIOGRAM;  Surgeon: [name], MD;  Location: [facility] CATH LAB;  Service: Cardiovascular;  Laterality: N/A;  . PARS PLANA VITRECTOMY  04/22/2011   Procedure: PARS PLANA VITRECTOMY WITH 25 GAUGE;  Surgeon: [name], MD;  Location: [place];  Service: Ophthalmology;  Laterality: Left;  membrane peel, endolaser, gas fluid exchange, silicone oil, repair of complex traction retinal detachment  . PARS PLANA VITRECTOMY  09/30/2011   Procedure: PARS PLANA VITRECTOMY WITH 25 GAUGE;  Surgeon: [name], MD;  Location: [place];  Service: Ophthalmology;  Laterality: Right;  Endolaser; Repair of Complex Traction Retinal Detachment  . PARS PLANA VITRECTOMY  02/24/2012   Procedure: PARS PLANA VITRECTOMY WITH 25 GAUGE;  Surgeon: [name], MD;  Location: [place];  Service: Ophthalmology;  Laterality: Left;  . PTCA    . SILICON OIL REMOVAL  1/61/0960   Procedure: SILICON OIL REMOVAL;  Surgeon: [name], MD;  Location: [place];  Service: Ophthalmology;  Laterality: Left;  . THROMBECTOMY / ARTERIOVENOUS GRAFT REVISION    . TUBAL LIGATION     1979   Family History  Problem Relation Age of Onset  . Stroke Mother   . Heart attack Mother   . Heart disease Mother   . Colon cancer Father   . Colon cancer Sister   . Heart attack Brother   . Stroke Brother   . Diabetes Brother   . Breast cancer Sister   . Diabetes Brother   . Diabetes Daughter   . Anesthesia problems Neg Hx   . Hypotension Neg Hx   . Malignant hyperthermia Neg Hx   . Pseudochol deficiency Neg Hx     Allergies: Hydrocodone and Metformin Current Outpatient Prescriptions on File Prior to Visit  Medication Sig Dispense Refill  . albuterol (PROVENTIL HFA;VENTOLIN HFA) 108 (90 BASE) MCG/ACT inhaler Inhale 2 puffs into the lungs every 6 (six) hours as needed for wheezing or shortness of breath.    Marland Kitchen albuterol (PROVENTIL) (2.5 MG/3ML) 0.083% nebulizer solution Take 2.5 mg by nebulization every 4 (four) hours as needed for wheezing or shortness of breath.    Marland Kitchen amLODipine (NORVASC) 10 MG tablet     . aspirin EC 81 MG tablet Take 81 mg by mouth daily.    Marland Kitchen atorvastatin (LIPITOR) 40 MG tablet Take 1 tablet (40 mg total) by mouth daily. (Patient taking differently: Take 40 mg by mouth daily at 6 PM. ) 90 tablet 3  . carvedilol (COREG) 12.5 MG tablet     . cloNIDine (CATAPRES) 0.1 MG tablet Take 0.1 mg by mouth 2 (two) times daily.    . furosemide (LASIX) 80 MG tablet Take 80 mg by mouth daily.     . isosorbide mononitrate (IMDUR) 60 MG 24 hr tablet  Take 1 tablet (60 mg total) by mouth daily. 90 tablet 3  . losartan (COZAAR) 50 MG tablet     . Melatonin 1 MG CAPS Take 10 mg by mouth daily.     . multivitamin (RENA-VIT) TABS tablet TAKE ONE TABLET BY MOUTH ONCE DAILY 30 tablet 5  . naproxen sodium (ANAPROX) 220 MG tablet Take 220 mg by mouth daily as needed.    Marland Kitchen oxyCODONE-acetaminophen (PERCOCET/ROXICET) 5-325 MG tablet Take 1 tablet by mouth daily as needed for severe pain.     Marland Kitchen  pregabalin (LYRICA) 75 MG capsule Take 1 capsule (75 mg total) by mouth daily. 90 capsule 1   Current Facility-Administered Medications on File Prior to Visit  Medication Dose Route Frequency Provider Last Rate Last Dose  . betamethasone acetate-betamethasone sodium phosphate (CELESTONE) injection 3 mg  3 mg Intramuscular Once Edrick Kins, DPM        Social History  Substance Use Topics  . Smoking status: Former Smoker    Packs/day: 1.00    Years: 25.00    Quit date: 06/09/2010  . Smokeless tobacco: Never Used  . Alcohol use No    Review of Systems  Constitutional: Negative for chills and fever.  Eyes: Negative for visual disturbance.  Respiratory: Negative for cough and shortness of breath.   Cardiovascular: Negative for chest pain and palpitations.  Gastrointestinal: Negative for nausea and vomiting.  Neurological: Negative for dizziness, light-headedness and headaches.      Objective:    BP (!) 128/46   Pulse 70   Temp 98.1 F (36.7 C) (Oral)   Ht 5' 7.5" (1.715 m)   Wt 171 lb 3.2 oz (77.7 kg)   SpO2 97%   BMI 26.42 kg/m  BP Readings from Last 3 Encounters:  01/15/17 (!) 128/46  12/25/16 (!) 157/64  11/13/16 (!) 152/58   Wt Readings from Last 3 Encounters:  01/15/17 171 lb 3.2 oz (77.7 kg)  12/25/16 170 lb (77.1 kg)  11/13/16 171 lb 9.6 oz (77.8 kg)    Physical Exam  Constitutional: She appears well-developed and well-nourished.  Eyes: Conjunctivae are normal.  Cardiovascular: Normal rate, regular rhythm and normal pulses.     Murmur heard.  Systolic murmur is present with a grade of 2/6  SEM II/VI, Loudest LSB, non radiating, no thrill   Pulmonary/Chest: Effort normal and breath sounds normal. She has no wheezes. She has no rhonchi. She has no rales.  Neurological: She is alert.  Skin: Skin is warm and dry.  Psychiatric: She has a normal mood and affect. Her speech is normal and behavior is normal. Thought content normal.  Vitals reviewed.      Assessment & Plan:   Problem List Items Addressed This Visit      Cardiovascular and Mediastinum   Essential (primary) hypertension - Primary (Chronic)    Well controlled. Will continue current regimen. Refilled nitro since expired.        Relevant Medications   nitroGLYCERIN (NITROSTAT) 0.4 MG SL tablet   Diastolic heart failure (HCC)    No adventitious lung sounds to suggest fluid overload. Takes lasix QOD. Follows with [facility].       Relevant Medications   nitroGLYCERIN (NITROSTAT) 0.4 MG SL tablet   Mitral regurgitation    Clinically asymptomatic. Due for repeat Echo. Pending f/u appt with Goll[facility].      Relevant Medications   nitroGLYCERIN (NITROSTAT) 0.4 MG SL tablet     Endocrine   Diabetes mellitus, type II ([place]) (Chronic)    Patient will notify me of a1c which was drawn during HD.          I am having Ms. Mullany maintain her aspirin EC, albuterol, albuterol, furosemide, Melatonin, pregabalin, isosorbide mononitrate, atorvastatin, cloNIDine, oxyCODONE-acetaminophen, naproxen sodium, amLODipine, multivitamin, losartan, carvedilol, and nitroGLYCERIN. We will continue to administer betamethasone acetate-betamethasone sodium phosphate.   Meds ordered this encounter  Medications  . nitroGLYCERIN (NITROSTAT) 0.4 MG SL tablet    Sig: Place 1 tablet (0.4 mg total) under the tongue every 5 (five) minutes as needed  for chest pain.    Dispense:  25 tablet    Refill:  3    Order Specific Question:   Supervising Provider    Answer:   Crecencio Mc  [[id number]]    Return precautions given.   Risks, benefits, and alternatives of the medications and treatment plan prescribed today were discussed, and patient expressed understanding.   Education regarding symptom management and diagnosis given to patient on AVS.  Continue to follow with Burnard Hawthorne, FNP for routine health maintenance.   Theresa [name] and I agreed with plan.   Mable Paris, FNP

## 2017-01-15 NOTE — Progress Notes (Signed)
Pre visit review using our clinic review tool, if applicable. No additional management support is needed unless otherwise documented below in the visit note. 

## 2017-01-15 NOTE — Assessment & Plan Note (Signed)
Patient will notify me of a1c which was drawn during HD.

## 2017-01-15 NOTE — Assessment & Plan Note (Addendum)
Clinically asymptomatic. Due for repeat Echo. Pending f/u appt with Gollan.

## 2017-01-16 DIAGNOSIS — D631 Anemia in chronic kidney disease: Secondary | ICD-10-CM | POA: Diagnosis not present

## 2017-01-16 DIAGNOSIS — Z992 Dependence on renal dialysis: Secondary | ICD-10-CM | POA: Diagnosis not present

## 2017-01-16 DIAGNOSIS — N2581 Secondary hyperparathyroidism of renal origin: Secondary | ICD-10-CM | POA: Diagnosis not present

## 2017-01-16 DIAGNOSIS — N186 End stage renal disease: Secondary | ICD-10-CM | POA: Diagnosis not present

## 2017-01-16 DIAGNOSIS — D509 Iron deficiency anemia, unspecified: Secondary | ICD-10-CM | POA: Diagnosis not present

## 2017-01-16 NOTE — Assessment & Plan Note (Addendum)
Well controlled. Will continue current regimen. Refilled nitro since expired.

## 2017-01-17 NOTE — Assessment & Plan Note (Addendum)
No adventitious lung sounds to suggest fluid overload. Takes lasix QOD. Follows with Gollan.

## 2017-01-19 DIAGNOSIS — D509 Iron deficiency anemia, unspecified: Secondary | ICD-10-CM | POA: Diagnosis not present

## 2017-01-19 DIAGNOSIS — D631 Anemia in chronic kidney disease: Secondary | ICD-10-CM | POA: Diagnosis not present

## 2017-01-19 DIAGNOSIS — N2581 Secondary hyperparathyroidism of renal origin: Secondary | ICD-10-CM | POA: Diagnosis not present

## 2017-01-19 DIAGNOSIS — N186 End stage renal disease: Secondary | ICD-10-CM | POA: Diagnosis not present

## 2017-01-19 DIAGNOSIS — Z992 Dependence on renal dialysis: Secondary | ICD-10-CM | POA: Diagnosis not present

## 2017-01-21 ENCOUNTER — Telehealth: Payer: Self-pay | Admitting: Family

## 2017-01-21 DIAGNOSIS — Z992 Dependence on renal dialysis: Secondary | ICD-10-CM | POA: Diagnosis not present

## 2017-01-21 DIAGNOSIS — D509 Iron deficiency anemia, unspecified: Secondary | ICD-10-CM | POA: Diagnosis not present

## 2017-01-21 DIAGNOSIS — N2581 Secondary hyperparathyroidism of renal origin: Secondary | ICD-10-CM | POA: Diagnosis not present

## 2017-01-21 DIAGNOSIS — N186 End stage renal disease: Secondary | ICD-10-CM | POA: Diagnosis not present

## 2017-01-21 DIAGNOSIS — D631 Anemia in chronic kidney disease: Secondary | ICD-10-CM | POA: Diagnosis not present

## 2017-01-21 NOTE — Telephone Encounter (Signed)
Impressive  noted

## 2017-01-21 NOTE — Telephone Encounter (Signed)
Pt called and wanted to inform M. Arnett that her A1C was 5.4 on 12/17/16.

## 2017-01-21 NOTE — Telephone Encounter (Signed)
FYI

## 2017-01-23 DIAGNOSIS — Z992 Dependence on renal dialysis: Secondary | ICD-10-CM | POA: Diagnosis not present

## 2017-01-23 DIAGNOSIS — N2581 Secondary hyperparathyroidism of renal origin: Secondary | ICD-10-CM | POA: Diagnosis not present

## 2017-01-23 DIAGNOSIS — D509 Iron deficiency anemia, unspecified: Secondary | ICD-10-CM | POA: Diagnosis not present

## 2017-01-23 DIAGNOSIS — N186 End stage renal disease: Secondary | ICD-10-CM | POA: Diagnosis not present

## 2017-01-23 DIAGNOSIS — D631 Anemia in chronic kidney disease: Secondary | ICD-10-CM | POA: Diagnosis not present

## 2017-01-26 DIAGNOSIS — D631 Anemia in chronic kidney disease: Secondary | ICD-10-CM | POA: Diagnosis not present

## 2017-01-26 DIAGNOSIS — Z992 Dependence on renal dialysis: Secondary | ICD-10-CM | POA: Diagnosis not present

## 2017-01-26 DIAGNOSIS — N2581 Secondary hyperparathyroidism of renal origin: Secondary | ICD-10-CM | POA: Diagnosis not present

## 2017-01-26 DIAGNOSIS — D509 Iron deficiency anemia, unspecified: Secondary | ICD-10-CM | POA: Diagnosis not present

## 2017-01-26 DIAGNOSIS — N186 End stage renal disease: Secondary | ICD-10-CM | POA: Diagnosis not present

## 2017-01-28 DIAGNOSIS — Z992 Dependence on renal dialysis: Secondary | ICD-10-CM | POA: Diagnosis not present

## 2017-01-28 DIAGNOSIS — N186 End stage renal disease: Secondary | ICD-10-CM | POA: Diagnosis not present

## 2017-01-28 DIAGNOSIS — N2581 Secondary hyperparathyroidism of renal origin: Secondary | ICD-10-CM | POA: Diagnosis not present

## 2017-01-28 DIAGNOSIS — D509 Iron deficiency anemia, unspecified: Secondary | ICD-10-CM | POA: Diagnosis not present

## 2017-01-28 DIAGNOSIS — D631 Anemia in chronic kidney disease: Secondary | ICD-10-CM | POA: Diagnosis not present

## 2017-01-30 ENCOUNTER — Telehealth: Payer: Self-pay | Admitting: Cardiovascular Disease

## 2017-01-30 DIAGNOSIS — D509 Iron deficiency anemia, unspecified: Secondary | ICD-10-CM | POA: Diagnosis not present

## 2017-01-30 DIAGNOSIS — N186 End stage renal disease: Secondary | ICD-10-CM | POA: Diagnosis not present

## 2017-01-30 DIAGNOSIS — N2581 Secondary hyperparathyroidism of renal origin: Secondary | ICD-10-CM | POA: Diagnosis not present

## 2017-01-30 DIAGNOSIS — D631 Anemia in chronic kidney disease: Secondary | ICD-10-CM | POA: Diagnosis not present

## 2017-01-30 DIAGNOSIS — Z992 Dependence on renal dialysis: Secondary | ICD-10-CM | POA: Diagnosis not present

## 2017-01-30 NOTE — Telephone Encounter (Signed)
error 

## 2017-01-30 NOTE — Telephone Encounter (Signed)
Attempted to schedule 1 year fu with patient for carotid u/s she wants to wait until she sees Dr. Rockey Situ on 9/11

## 2017-02-02 ENCOUNTER — Ambulatory Visit: Admission: RE | Admit: 2017-02-02 | Discharge: 2017-02-02 | Attending: Surgery

## 2017-02-02 DIAGNOSIS — N186 End stage renal disease: Secondary | ICD-10-CM | POA: Diagnosis not present

## 2017-02-02 DIAGNOSIS — D631 Anemia in chronic kidney disease: Secondary | ICD-10-CM | POA: Diagnosis not present

## 2017-02-02 DIAGNOSIS — Z992 Dependence on renal dialysis: Secondary | ICD-10-CM | POA: Diagnosis not present

## 2017-02-02 DIAGNOSIS — N2581 Secondary hyperparathyroidism of renal origin: Secondary | ICD-10-CM | POA: Diagnosis not present

## 2017-02-02 DIAGNOSIS — D509 Iron deficiency anemia, unspecified: Secondary | ICD-10-CM | POA: Diagnosis not present

## 2017-02-04 DIAGNOSIS — Z992 Dependence on renal dialysis: Secondary | ICD-10-CM | POA: Diagnosis not present

## 2017-02-04 DIAGNOSIS — N186 End stage renal disease: Secondary | ICD-10-CM | POA: Diagnosis not present

## 2017-02-04 DIAGNOSIS — D631 Anemia in chronic kidney disease: Secondary | ICD-10-CM | POA: Diagnosis not present

## 2017-02-04 DIAGNOSIS — D509 Iron deficiency anemia, unspecified: Secondary | ICD-10-CM | POA: Diagnosis not present

## 2017-02-04 DIAGNOSIS — N2581 Secondary hyperparathyroidism of renal origin: Secondary | ICD-10-CM | POA: Diagnosis not present

## 2017-02-05 DIAGNOSIS — Z01818 Encounter for other preprocedural examination: Principal | ICD-10-CM

## 2017-02-05 DIAGNOSIS — N186 End stage renal disease: Secondary | ICD-10-CM

## 2017-02-05 DIAGNOSIS — Z7682 Awaiting organ transplant status: Secondary | ICD-10-CM

## 2017-02-06 DIAGNOSIS — Z992 Dependence on renal dialysis: Secondary | ICD-10-CM | POA: Diagnosis not present

## 2017-02-06 DIAGNOSIS — D631 Anemia in chronic kidney disease: Secondary | ICD-10-CM | POA: Diagnosis not present

## 2017-02-06 DIAGNOSIS — D509 Iron deficiency anemia, unspecified: Secondary | ICD-10-CM | POA: Diagnosis not present

## 2017-02-06 DIAGNOSIS — N186 End stage renal disease: Secondary | ICD-10-CM | POA: Diagnosis not present

## 2017-02-06 DIAGNOSIS — N2581 Secondary hyperparathyroidism of renal origin: Secondary | ICD-10-CM | POA: Diagnosis not present

## 2017-02-07 DIAGNOSIS — D509 Iron deficiency anemia, unspecified: Secondary | ICD-10-CM | POA: Diagnosis not present

## 2017-02-07 DIAGNOSIS — D631 Anemia in chronic kidney disease: Secondary | ICD-10-CM | POA: Diagnosis not present

## 2017-02-07 DIAGNOSIS — Z992 Dependence on renal dialysis: Secondary | ICD-10-CM | POA: Diagnosis not present

## 2017-02-07 DIAGNOSIS — N2581 Secondary hyperparathyroidism of renal origin: Secondary | ICD-10-CM | POA: Diagnosis not present

## 2017-02-07 DIAGNOSIS — N186 End stage renal disease: Secondary | ICD-10-CM | POA: Diagnosis not present

## 2017-02-09 DIAGNOSIS — Z992 Dependence on renal dialysis: Secondary | ICD-10-CM | POA: Diagnosis not present

## 2017-02-09 DIAGNOSIS — N2581 Secondary hyperparathyroidism of renal origin: Secondary | ICD-10-CM | POA: Diagnosis not present

## 2017-02-09 DIAGNOSIS — D631 Anemia in chronic kidney disease: Secondary | ICD-10-CM | POA: Diagnosis not present

## 2017-02-09 DIAGNOSIS — N186 End stage renal disease: Secondary | ICD-10-CM | POA: Diagnosis not present

## 2017-02-09 DIAGNOSIS — D509 Iron deficiency anemia, unspecified: Secondary | ICD-10-CM | POA: Diagnosis not present

## 2017-02-11 DIAGNOSIS — N2581 Secondary hyperparathyroidism of renal origin: Secondary | ICD-10-CM | POA: Diagnosis not present

## 2017-02-11 DIAGNOSIS — D509 Iron deficiency anemia, unspecified: Secondary | ICD-10-CM | POA: Diagnosis not present

## 2017-02-11 DIAGNOSIS — D631 Anemia in chronic kidney disease: Secondary | ICD-10-CM | POA: Diagnosis not present

## 2017-02-11 DIAGNOSIS — Z992 Dependence on renal dialysis: Secondary | ICD-10-CM | POA: Diagnosis not present

## 2017-02-11 DIAGNOSIS — N186 End stage renal disease: Secondary | ICD-10-CM | POA: Diagnosis not present

## 2017-02-13 DIAGNOSIS — Z992 Dependence on renal dialysis: Secondary | ICD-10-CM | POA: Diagnosis not present

## 2017-02-13 DIAGNOSIS — D509 Iron deficiency anemia, unspecified: Secondary | ICD-10-CM | POA: Diagnosis not present

## 2017-02-13 DIAGNOSIS — D631 Anemia in chronic kidney disease: Secondary | ICD-10-CM | POA: Diagnosis not present

## 2017-02-13 DIAGNOSIS — N186 End stage renal disease: Secondary | ICD-10-CM | POA: Diagnosis not present

## 2017-02-13 DIAGNOSIS — N2581 Secondary hyperparathyroidism of renal origin: Secondary | ICD-10-CM | POA: Diagnosis not present

## 2017-02-15 NOTE — Progress Notes (Signed)
Cardiology Office Note  Date:  02/17/2017   ID:  Theresa [name], DOB 11-06-1955, MRN [id number]  PCP:  Burnard Hawthorne, FNP   Chief Complaint  Patient presents with  . Other    12 month f/u no complaints today    HPI:  Theresa [name] is a pleasant 61 -year old woman with  CAD,  Drug-eluting stent placed December 2014 to the mid RCA Repeat cath 2017 for positive stress test at [facility], medical management 25 years of smoking (quit in 2013),  diabetes,  chronic renal insufficiency,   moderate to severe mitral valve regurgitation on prior echocardiogram (improved to moderate MR on recent echo),  pneumonia in June of 2012 with admission overnight to [facility] ESRD on dialysis since February 2014, 3 days per week. 59% bilateral carotid arterial disease as of 2017  Positive stress test  03/2016 at [facility] She presents today for follow-up of her coronary artery disease   Car broke down today, BP elevated As she is stressed out Does not check her blood pressure at home on a regular basis Otherwise reports that she feels fine  She is having Some palpitations, 3 to 4 min Butterflies in her chest  Typically presents At night, possibly secondary to anxiety or stress  Denies any chest pain, shortness of breath on exertion Reports dialysis is going relatively well Diabetes numbers improved per the patient  EKG personally reviewed by myself on todays visit Shows normal sinus rhythm rate 70 bpm no significant ST or T-wave changes  Other past medical history reviewed Presented to the hospital September 2017 with chest tightness, shortness of breath Reports they were not pulling enough fluid off with hemodialysis on Fridays, had trouble getting to Monday without shortness of breath symptoms.  Previously drinking too much on the weekend She does make some urine, she is on Lasix  HBa1C 5.8 On HD since 2014 On transplant list, since 2014  Echocardiogram shows normal LV function, right-sided pressures  45 mmHg moderate MR  evaluation in the hospital  05/12/2014 for fluid overload, hyperkalemia, shortness of breath, saturations 89% on room air, systolic blood pressure 465. She had dialysis with improvement of her symptoms. She does hemodialysis on Monday, Wednesday, Friday She was told that her potassium intake was too high.   Previously he would wake up with tachycardia.  sits up and symptoms typically resolve.   hospital admission for pneumonia 05/28/2013, elevated cardiac enzymes, transferred to [place] for cardiac catheterization. She had stent placed for 95% mid RCA lesion. She had 2.5 x 16 mm Promus stent placed, post dilated up to 2.78 mm . Since then she reports she is doing well with no further chest pain or shortness of breath . She is on the renal transplant list but reports this will require her to be on the list for 5 years before transplant eligibility   Readmission to the hospital 06/14/2013 for C. difficile colitis. Discharge on 06/16/2013 C. difficile was indigent positive, toxin negative. She was treated with Flagyl for 10 days  Stress test at [facility] August 2014 was essentially normal with normal ejection fraction (there was mention of very small mild region of artifact) Echocardiogram at [facility] July 2014 showed ejection fraction greater than 55%, mild MR  She sees Dr. Candiss Norse  of [place] [facility] .  previous  AV graft on 02/23/2013. Graft was thrombosed. Status post revision   Echocardiogram on November 10, 2010 shows mildly dilated left ventricle, ejection fraction 45-50%, mild MS, moderate to severe  mitral regurg, mild to moderate tricuspid regurg.   Cardiac catheterization 2017 showed 70% disease ostial right PDA, unchanged from 2014 cardiac catheterization otherwise other regions of 20-30% disease, nonobstructive  PMH:   has a past medical history of Anemia of chronic disease; Carotid arterial disease ([place]); Chronic constipation; Chronic diastolic CHF (congestive heart  failure) ([place]); Colon polyps; COPD (chronic obstructive pulmonary disease) ([place]); Coronary artery disease; Diabetes mellitus; Diabetic neuropathy ([place]); Diabetic retinopathy ([place]) (05/28/2013); Emphysema; ESRD on hemodialysis ([place]); History of bronchitis; History of pneumonia; History of tobacco abuse; Hyperlipidemia; Hypertension; Moderate mitral insufficiency; Myocardial infarct [facility]) (05/2013); Peripheral vascular disease ([place]); and Sickle cell trait ([place]).  PSH:    Past Surgical History:  Procedure Laterality Date  . ABDOMINAL HYSTERECTOMY     2000  . CARDIAC CATHETERIZATION    . CARDIAC CATHETERIZATION N/A 05/02/2015   Procedure: Left Heart Cath and Coronary Angiography;  Surgeon: [name], MD;  Location: [place] CV [facility];  Service: Cardiovascular;  Laterality: N/A;  . COLONOSCOPY WITH PROPOFOL N/A 07/08/2016   Procedure: COLONOSCOPY WITH PROPOFOL;  Surgeon: [name], MD;  Location: [facility] ENDOSCOPY;  Service: Endoscopy;  Laterality: N/A;  . colonscopy    . CORONARY ANGIOPLASTY  05/28/2014   stent placement to the mid RCA  . DILATION AND CURETTAGE OF UTERUS     several in the early 80's  . ESOPHAGOGASTRODUODENOSCOPY     2012  . EYE SURGERY     bilateral laser 2012  . EYE SURGERY     right  . GAS INSERTION  09/30/2011   Procedure: INSERTION OF GAS;  Surgeon: [name], MD;  Location: [place];  Service: Ophthalmology;  Laterality: Right;  [id number]  . GAS/FLUID EXCHANGE  09/30/2011   Procedure: GAS/FLUID EXCHANGE;  Surgeon: [name], MD;  Location: [place];  Service: Ophthalmology;  Laterality: Right;  . LEFT HEART CATHETERIZATION WITH CORONARY ANGIOGRAM N/A 05/28/2013   Procedure: LEFT HEART CATHETERIZATION WITH CORONARY ANGIOGRAM;  Surgeon: [name], MD;  Location: [facility] CATH LAB;  Service: Cardiovascular;  Laterality: N/A;  . PARS PLANA VITRECTOMY  04/22/2011   Procedure: PARS PLANA VITRECTOMY WITH 25 GAUGE;  Surgeon: [name], MD;  Location: [place];   Service: Ophthalmology;  Laterality: Left;  membrane peel, endolaser, gas fluid exchange, silicone oil, repair of complex traction retinal detachment  . PARS PLANA VITRECTOMY  09/30/2011   Procedure: PARS PLANA VITRECTOMY WITH 25 GAUGE;  Surgeon: [name], MD;  Location: [place];  Service: Ophthalmology;  Laterality: Right;  Endolaser; Repair of Complex Traction Retinal Detachment  . PARS PLANA VITRECTOMY  02/24/2012   Procedure: PARS PLANA VITRECTOMY WITH 25 GAUGE;  Surgeon: [name], MD;  Location: [place];  Service: Ophthalmology;  Laterality: Left;  . PTCA    . SILICON OIL REMOVAL  09/15/8117   Procedure: SILICON OIL REMOVAL;  Surgeon: [name], MD;  Location: [place];  Service: Ophthalmology;  Laterality: Left;  . THROMBECTOMY / ARTERIOVENOUS GRAFT REVISION    . TUBAL LIGATION     1979    Current Outpatient Prescriptions  Medication Sig Dispense Refill  . albuterol (PROVENTIL HFA;VENTOLIN HFA) 108 (90 BASE) MCG/ACT inhaler Inhale 2 puffs into the lungs every 6 (six) hours as needed for wheezing or shortness of breath.    Marland Kitchen albuterol (PROVENTIL) (2.5 MG/3ML) 0.083% nebulizer solution Take 2.5 mg by nebulization every 4 (four) hours as needed for wheezing or shortness of breath.    Marland Kitchen amLODipine (  NORVASC) 10 MG tablet Take 10 mg by mouth daily.     Marland Kitchen aspirin EC 81 MG tablet Take 81 mg by mouth daily.    Marland Kitchen atorvastatin (LIPITOR) 40 MG tablet Take 1 tablet (40 mg total) by mouth daily. (Patient taking differently: Take 40 mg by mouth daily at 6 PM. ) 90 tablet 3  . carvedilol (COREG) 12.5 MG tablet Take 12.5 mg by mouth 2 (two) times daily with a meal.     . cloNIDine (CATAPRES) 0.1 MG tablet Take 0.1 mg by mouth 2 (two) times daily.    . furosemide (LASIX) 80 MG tablet Take 80 mg by mouth daily.     . isosorbide mononitrate (IMDUR) 60 MG 24 hr tablet Take 1 tablet (60 mg total) by mouth daily. 90 tablet 3  . losartan (COZAAR) 50 MG tablet Take 50 mg by mouth daily.     .  Melatonin 1 MG CAPS Take 10 mg by mouth daily.     . multivitamin (RENA-VIT) TABS tablet TAKE ONE TABLET BY MOUTH ONCE DAILY 30 tablet 5  . naproxen sodium (ANAPROX) 220 MG tablet Take 220 mg by mouth daily as needed.    . nitroGLYCERIN (NITROSTAT) 0.4 MG SL tablet Place 1 tablet (0.4 mg total) under the tongue every 5 (five) minutes as needed for chest pain. 25 tablet 3  . pregabalin (LYRICA) 75 MG capsule Take 1 capsule (75 mg total) by mouth daily. 90 capsule 1   Current Facility-Administered Medications  Medication Dose Route Frequency Provider Last Rate Last Dose  . betamethasone acetate-betamethasone sodium phosphate (CELESTONE) injection 3 mg  3 mg Intramuscular Once Edrick Kins, DPM         Allergies:   Hydrocodone and Metformin   Social History:  The patient  reports that she quit smoking about 6 years ago. She has a 25.00 pack-year smoking history. She has never used smokeless tobacco. She reports that she does not drink alcohol or use drugs.   Family History:   family history includes Breast cancer in her sister; Colon cancer in her father and sister; Diabetes in her brother, brother, and daughter; Heart attack in her brother and mother; Heart disease in her mother; Stroke in her brother and mother.    Review of Systems: Review of Systems  Constitutional: Negative.   Respiratory: Negative.   Cardiovascular: Negative.   Gastrointestinal: Negative.   Musculoskeletal: Negative.   Neurological: Negative.   Psychiatric/Behavioral: Negative.   All other systems reviewed and are negative.    PHYSICAL EXAM: VS:  BP (!) 192/73 (BP Location: Right Arm, Patient Position: Sitting, Cuff Size: Normal)   Pulse 70   Ht 5' 7.5" (1.715 m)   Wt 174 lb 12 oz (79.3 kg)   BMI 26.97 kg/m  , BMI Body mass index is 26.97 kg/m. Repeat blood pressure 170/70 GEN: Well nourished, well developed, in no acute distress  HEENT: normal  Neck: no JVD, carotid bruits, or masses Cardiac: RRR;2+  SEM LSB,rubs, or gallops,no edema  Respiratory:  clear to auscultation bilaterally, normal work of breathing GI: soft, nontender, nondistended, + BS MS: no deformity or atrophy  Skin: warm and dry, no rash Neuro:  Strength and sensation are intact Psych: euthymic mood, full affect    Recent Labs: 05/21/2016: BUN 27; Creatinine, Ser 4.98; Hemoglobin 11.5; Platelets 155; Sodium 138 05/22/2016: TSH 1.685 07/09/2016: Potassium 4.5    Lipid Panel Lab Results  Component Value Date   CHOL 161 05/22/2016  HDL 59 05/22/2016   LDLCALC 89 05/22/2016   TRIG 65 05/22/2016      Wt Readings from Last 3 Encounters:  02/17/17 174 lb 12 oz (79.3 kg)  01/15/17 171 lb 3.2 oz (77.7 kg)  12/25/16 170 lb (77.1 kg)       ASSESSMENT AND PLAN:  Essential (primary) hypertension - Plan: EKG 12-Lead Markedly elevated on today's visit, possibly from stress over car broken down in her parking lot Recommended she take extra clonidine if blood pressure remains elevated  Coronary artery disease involving native coronary artery of native heart without angina pectoris - Plan: EKG 12-Lead Currently with no symptoms of angina. No further workup at this time. Continue current medication regimen.  Mitral valve insufficiency, unspecified etiology - Plan: EKG 12-Lead Previous echocardiogram September 2017 with moderate to severe MR Likely dynamic MR, will change with fluid status from hemodialysis more aggressive hemodialysis especially on Fridays Denies shortness of breath, leg swelling Consider repeat echocardiogram in follow-up visit  ESRD on hemodialysis [facility]) Monday Wednesday Friday, dialysis, Lasix on other days She cut down on her fluids on weekends to she can make it to Monday without shortness of breath symptoms. Previous hospitalization September 2017,  Diabetic polyneuropathy associated with type 2 diabetes mellitus ([place]) Long discussion concerning her diabetes control, she is doing much  better A1c is down  S/P coronary artery stent placement Denies anginal symptoms  Carotid stenosis 40-59% bilateral disease Stressed importance of smoking cessation, aggressive diabetes control, aggressive lipids   Total encounter time more than 25 minutes  Greater than 50% was spent in counseling and coordination of care with the patient   Disposition:   F/U  6 months   No orders of the defined types were placed in this encounter.    Signed, Esmond Plants, M.D., Ph.D. 02/17/2017  [place], [place]

## 2017-02-16 ENCOUNTER — Other Ambulatory Visit: Payer: Self-pay | Admitting: Cardiovascular Disease

## 2017-02-16 DIAGNOSIS — I6523 Occlusion and stenosis of bilateral carotid arteries: Secondary | ICD-10-CM

## 2017-02-16 DIAGNOSIS — N186 End stage renal disease: Secondary | ICD-10-CM | POA: Diagnosis not present

## 2017-02-16 DIAGNOSIS — D631 Anemia in chronic kidney disease: Secondary | ICD-10-CM | POA: Diagnosis not present

## 2017-02-16 DIAGNOSIS — D509 Iron deficiency anemia, unspecified: Secondary | ICD-10-CM | POA: Diagnosis not present

## 2017-02-16 DIAGNOSIS — Z992 Dependence on renal dialysis: Secondary | ICD-10-CM | POA: Diagnosis not present

## 2017-02-16 DIAGNOSIS — N2581 Secondary hyperparathyroidism of renal origin: Secondary | ICD-10-CM | POA: Diagnosis not present

## 2017-02-17 ENCOUNTER — Ambulatory Visit (INDEPENDENT_AMBULATORY_CARE_PROVIDER_SITE_OTHER): Payer: Medicare Other | Admitting: Cardiovascular Disease

## 2017-02-17 ENCOUNTER — Encounter: Payer: Self-pay | Admitting: Cardiovascular Disease

## 2017-02-17 VITALS — BP 192/73 | HR 70 | Ht 67.5 in | Wt 174.8 lb

## 2017-02-17 DIAGNOSIS — Z955 Presence of coronary angioplasty implant and graft: Secondary | ICD-10-CM

## 2017-02-17 DIAGNOSIS — I25118 Atherosclerotic heart disease of native coronary artery with other forms of angina pectoris: Secondary | ICD-10-CM | POA: Diagnosis not present

## 2017-02-17 DIAGNOSIS — E1122 Type 2 diabetes mellitus with diabetic chronic kidney disease: Secondary | ICD-10-CM | POA: Diagnosis not present

## 2017-02-17 DIAGNOSIS — E782 Mixed hyperlipidemia: Secondary | ICD-10-CM | POA: Diagnosis not present

## 2017-02-17 DIAGNOSIS — I209 Angina pectoris, unspecified: Secondary | ICD-10-CM

## 2017-02-17 DIAGNOSIS — I6523 Occlusion and stenosis of bilateral carotid arteries: Secondary | ICD-10-CM | POA: Insufficient documentation

## 2017-02-17 DIAGNOSIS — J432 Centrilobular emphysema: Secondary | ICD-10-CM

## 2017-02-17 DIAGNOSIS — Z794 Long term (current) use of insulin: Secondary | ICD-10-CM

## 2017-02-17 DIAGNOSIS — Z992 Dependence on renal dialysis: Secondary | ICD-10-CM | POA: Diagnosis not present

## 2017-02-17 DIAGNOSIS — I5032 Chronic diastolic (congestive) heart failure: Secondary | ICD-10-CM

## 2017-02-17 DIAGNOSIS — I10 Essential (primary) hypertension: Secondary | ICD-10-CM | POA: Diagnosis not present

## 2017-02-17 DIAGNOSIS — N186 End stage renal disease: Secondary | ICD-10-CM

## 2017-02-17 NOTE — Patient Instructions (Signed)
Medication Instructions:   No medication changes made  Please check pressures later today Take extra clonidine as needed for high pressure (>170)  Labwork:  No new labs needed  Testing/Procedures:  No further testing at this time   Follow-Up: It was a pleasure seeing you in the office today. Please call us if you have new issues that need to be addressed before your next appt.  [phone]  Your physician wants you to follow-up in: 6 months.  You will receive a reminder letter in the mail two months in advance. If you don't receive a letter, please call our office to schedule the follow-up appointment.  If you need a refill on your cardiac medications before your next appointment, please call your pharmacy.

## 2017-02-17 NOTE — Telephone Encounter (Signed)
Scheduled 02/24/17

## 2017-02-18 DIAGNOSIS — D509 Iron deficiency anemia, unspecified: Secondary | ICD-10-CM | POA: Diagnosis not present

## 2017-02-18 DIAGNOSIS — N186 End stage renal disease: Secondary | ICD-10-CM | POA: Diagnosis not present

## 2017-02-18 DIAGNOSIS — Z992 Dependence on renal dialysis: Secondary | ICD-10-CM | POA: Diagnosis not present

## 2017-02-18 DIAGNOSIS — D631 Anemia in chronic kidney disease: Secondary | ICD-10-CM | POA: Diagnosis not present

## 2017-02-18 DIAGNOSIS — N2581 Secondary hyperparathyroidism of renal origin: Secondary | ICD-10-CM | POA: Diagnosis not present

## 2017-02-19 NOTE — Addendum Note (Signed)
Addended by: Britt Bottom on: 02/19/2017 08:10 AM   Modules accepted: Orders

## 2017-02-20 DIAGNOSIS — D631 Anemia in chronic kidney disease: Secondary | ICD-10-CM | POA: Diagnosis not present

## 2017-02-20 DIAGNOSIS — N186 End stage renal disease: Secondary | ICD-10-CM | POA: Diagnosis not present

## 2017-02-20 DIAGNOSIS — N2581 Secondary hyperparathyroidism of renal origin: Secondary | ICD-10-CM | POA: Diagnosis not present

## 2017-02-20 DIAGNOSIS — D509 Iron deficiency anemia, unspecified: Secondary | ICD-10-CM | POA: Diagnosis not present

## 2017-02-20 DIAGNOSIS — Z992 Dependence on renal dialysis: Secondary | ICD-10-CM | POA: Diagnosis not present

## 2017-02-23 DIAGNOSIS — N2581 Secondary hyperparathyroidism of renal origin: Secondary | ICD-10-CM | POA: Diagnosis not present

## 2017-02-23 DIAGNOSIS — D631 Anemia in chronic kidney disease: Secondary | ICD-10-CM | POA: Diagnosis not present

## 2017-02-23 DIAGNOSIS — N186 End stage renal disease: Secondary | ICD-10-CM | POA: Diagnosis not present

## 2017-02-23 DIAGNOSIS — Z992 Dependence on renal dialysis: Secondary | ICD-10-CM | POA: Diagnosis not present

## 2017-02-23 DIAGNOSIS — D509 Iron deficiency anemia, unspecified: Secondary | ICD-10-CM | POA: Diagnosis not present

## 2017-02-24 ENCOUNTER — Ambulatory Visit (INDEPENDENT_AMBULATORY_CARE_PROVIDER_SITE_OTHER): Payer: Medicare Other

## 2017-02-24 VITALS — BP 140/78 | HR 65 | Temp 98.2°F | Resp 14 | Ht 67.5 in | Wt 173.8 lb

## 2017-02-24 DIAGNOSIS — Z0000 Encounter for general adult medical examination without abnormal findings: Secondary | ICD-10-CM

## 2017-02-24 DIAGNOSIS — Z23 Encounter for immunization: Secondary | ICD-10-CM | POA: Diagnosis not present

## 2017-02-24 NOTE — Progress Notes (Signed)
Subjective:   Theresa [name] is a 61 y.o. female who presents for an Initial Medicare Annual Wellness Visit.  Review of Systems    No ROS.  Medicare Wellness Visit. Additional risk factors are reflected in the social history.  Cardiac Risk Factors include: advanced age (>28men, >27 women);diabetes mellitus;hypertension     Objective:    Today's Vitals   02/24/17 1118 02/24/17 1119  BP:  140/78  Pulse:  65  Resp: 14 14  Temp:  98.2 F (36.8 C)  TempSrc:  Oral  SpO2:  99%  Weight: 173 lb 12.8 oz (78.8 kg) 173 lb 12.8 oz (78.8 kg)  Height: 5' 7.5" (1.715 m) 5' 7.5" (1.715 m)  PainSc:  8    Body mass index is 26.82 kg/m.   Current Medications (verified) Outpatient Encounter Prescriptions as of 02/24/2017  Medication Sig  . albuterol (PROVENTIL HFA;VENTOLIN HFA) 108 (90 BASE) MCG/ACT inhaler Inhale 2 puffs into the lungs every 6 (six) hours as needed for wheezing or shortness of breath.  Marland Kitchen albuterol (PROVENTIL) (2.5 MG/3ML) 0.083% nebulizer solution Take 2.5 mg by nebulization every 4 (four) hours as needed for wheezing or shortness of breath.  Marland Kitchen amLODipine (NORVASC) 10 MG tablet Take 10 mg by mouth daily.   Marland Kitchen aspirin EC 81 MG tablet Take 81 mg by mouth daily.  . carvedilol (COREG) 12.5 MG tablet Take 12.5 mg by mouth 2 (two) times daily with a meal.   . cloNIDine (CATAPRES) 0.1 MG tablet Take 0.1 mg by mouth 2 (two) times daily.  . furosemide (LASIX) 80 MG tablet Take 80 mg by mouth daily.   Marland Kitchen losartan (COZAAR) 50 MG tablet Take 50 mg by mouth daily.   . Melatonin 1 MG CAPS Take 10 mg by mouth daily.   . multivitamin (RENA-VIT) TABS tablet TAKE ONE TABLET BY MOUTH ONCE DAILY  . naproxen sodium (ANAPROX) 220 MG tablet Take 220 mg by mouth daily as needed.  . nitroGLYCERIN (NITROSTAT) 0.4 MG SL tablet Place 1 tablet (0.4 mg total) under the tongue every 5 (five) minutes as needed for chest pain.  . pregabalin (LYRICA) 75 MG capsule Take 1 capsule (75 mg total) by mouth daily.    Marland Kitchen atorvastatin (LIPITOR) 40 MG tablet Take 1 tablet (40 mg total) by mouth daily. (Patient taking differently: Take 40 mg by mouth daily at 6 PM. )  . isosorbide mononitrate (IMDUR) 60 MG 24 hr tablet Take 1 tablet (60 mg total) by mouth daily.   Facility-Administered Encounter Medications as of 02/24/2017  Medication  . betamethasone acetate-betamethasone sodium phosphate (CELESTONE) injection 3 mg    Allergies (verified) Hydrocodone and Metformin   History: Past Medical History:  Diagnosis Date  . Anemia of chronic disease   . Carotid arterial disease ([place])    a. 02/2013 U/S: 40-59% bilat ICA stenosis - *f/u 02/2014*  . Chronic constipation   . Chronic diastolic CHF (congestive heart failure) ([place])    a. 10/2013 Echo [facility]): EF 55-60%, mod conc LVH, mod MR, mildly dil LA, mild Ao sclerosis w/o stenosis.  . Colon polyps   . COPD (chronic obstructive pulmonary disease) ([place])   . Coronary artery disease    a. 05/2013 NSTEMI/PCI: LM 20d, LAD min irregs, LCX small, nl, OM1 nl, RCA dom 33m (2.5x16 Promus DES), PDA1 80p.  . Diabetes mellitus   . Diabetic neuropathy ([place])   . Diabetic retinopathy ([place]) 05/28/2013   Hx bilat retinal detachment, proliferative diab retinopathy and bilat vitreous hemorrhage   .  Emphysema   . ESRD on hemodialysis ([place])    a. [facility] in [place], [place], on a TTS schedule.  She started dialysis in Feb 2014.  Etiology of renal failure not known, likely diabetes.  Has a left upper arm AV graft.  . History of bronchitis    Mar 2012  . History of pneumonia    June 2012  . History of tobacco abuse    a. Quit 2012.  Marland Kitchen Hyperlipidemia   . Hypertension   . Moderate mitral insufficiency    a. 10/2013 Echo: EF 55-60%, mod MR.  . Myocardial infarct ([place]) 05/2013  . Peripheral vascular disease ([place])   . Sickle cell trait [facility])    Past Surgical History:  Procedure Laterality Date  . ABDOMINAL HYSTERECTOMY     2000  . CARDIAC CATHETERIZATION    . CARDIAC  CATHETERIZATION N/A 05/02/2015   Procedure: Left Heart Cath and Coronary Angiography;  Surgeon: [name], MD;  Location: [place] CV [facility];  Service: Cardiovascular;  Laterality: N/A;  . COLONOSCOPY WITH PROPOFOL N/A 07/08/2016   Procedure: COLONOSCOPY WITH PROPOFOL;  Surgeon: [name], MD;  Location: [facility] ENDOSCOPY;  Service: Endoscopy;  Laterality: N/A;  . colonscopy    . CORONARY ANGIOPLASTY  05/28/2014   stent placement to the mid RCA  . DILATION AND CURETTAGE OF UTERUS     several in the early 80's  . ESOPHAGOGASTRODUODENOSCOPY     2012  . EYE SURGERY     bilateral laser 2012  . EYE SURGERY     right  . GAS INSERTION  09/30/2011   Procedure: INSERTION OF GAS;  Surgeon: [name], MD;  Location: [place];  Service: Ophthalmology;  Laterality: Right;  [id number]  . GAS/FLUID EXCHANGE  09/30/2011   Procedure: GAS/FLUID EXCHANGE;  Surgeon: [name], MD;  Location: [place];  Service: Ophthalmology;  Laterality: Right;  . LEFT HEART CATHETERIZATION WITH CORONARY ANGIOGRAM N/A 05/28/2013   Procedure: LEFT HEART CATHETERIZATION WITH CORONARY ANGIOGRAM;  Surgeon: [name], MD;  Location: [facility] CATH LAB;  Service: Cardiovascular;  Laterality: N/A;  . PARS PLANA VITRECTOMY  04/22/2011   Procedure: PARS PLANA VITRECTOMY WITH 25 GAUGE;  Surgeon: [name], MD;  Location: [place];  Service: Ophthalmology;  Laterality: Left;  membrane peel, endolaser, gas fluid exchange, silicone oil, repair of complex traction retinal detachment  . PARS PLANA VITRECTOMY  09/30/2011   Procedure: PARS PLANA VITRECTOMY WITH 25 GAUGE;  Surgeon: [name], MD;  Location: [place];  Service: Ophthalmology;  Laterality: Right;  Endolaser; Repair of Complex Traction Retinal Detachment  . PARS PLANA VITRECTOMY  02/24/2012   Procedure: PARS PLANA VITRECTOMY WITH 25 GAUGE;  Surgeon: [name], MD;  Location: [place];  Service: Ophthalmology;  Laterality: Left;  . PTCA    . SILICON OIL REMOVAL   3/88/8280   Procedure: SILICON OIL REMOVAL;  Surgeon: [name], MD;  Location: [place];  Service: Ophthalmology;  Laterality: Left;  . THROMBECTOMY / ARTERIOVENOUS GRAFT REVISION    . TUBAL LIGATION     1979   Family History  Problem Relation Age of Onset  . Stroke Mother   . Heart attack Mother   . Heart disease Mother   . Colon cancer Father   . Colon cancer Sister   . Heart attack Brother   . Stroke Brother   . Diabetes Brother   . Breast cancer Sister   . Diabetes Brother   .  Diabetes Daughter   . Anesthesia problems Neg Hx   . Hypotension Neg Hx   . Malignant hyperthermia Neg Hx   . Pseudochol deficiency Neg Hx    Social History   Occupational History  . Not on file.   Social History Main Topics  . Smoking status: Former Smoker    Packs/day: 1.00    Years: 25.00    Quit date: 06/09/2010  . Smokeless tobacco: Never Used  . Alcohol use No  . Drug use: No  . Sexual activity: No    Tobacco Counseling Counseling given: Not Answered   Activities of Daily Living In your present state of health, do you have any difficulty performing the following activities: 02/24/2017 09/04/2016  Hearing? N N  Vision? Y Y  Difficulty concentrating or making decisions? Y N  Comment She notes some difficulty staying focused. -  Walking or climbing stairs? Y N  Dressing or bathing? N N  Doing errands, shopping? Y N  Comment She does not drive due to her vision. -  Preparing Food and eating ? N -  Using the Toilet? N -  In the past six months, have you accidently leaked urine? N -  Do you have problems with loss of bowel control? N -  Managing your Medications? N -  Managing your Finances? N -  Housekeeping or managing your Housekeeping? Y -  Comment Family assists -  Some recent data might be hidden    Immunizations and Health Maintenance Immunization History  Administered Date(s) Administered  . Influenza Nasal 03/06/2015  . Influenza Split 02/25/2012, 02/07/2013,  02/19/2015, 03/06/2015  . Influenza,inj,Quad PF,6+ Mos 02/21/2014, 02/24/2017  . PPD Test 11/12/2012  . Pneumococcal Polysaccharide-23 06/09/2010  . Tetanus 06/09/2010   Health Maintenance Due  Topic Date Due  . PNEUMOCOCCAL POLYSACCHARIDE VACCINE (2) 06/10/2015  . FOOT EXAM  12/19/2015  . HEMOGLOBIN A1C  11/20/2016  . INFLUENZA VACCINE  01/07/2017    Patient Care Team: Burnard Hawthorne, FNP as PCP - General (Family Medicine) Alisa Graff, FNP as Nurse Practitioner (Family Medicine) Minna Merritts, MD as Consulting Physician (Cardiology) Anthonette Legato, MD as Consulting Physician (Nephrology)  Indicate any recent Medical Services you may have received from other than [facility] providers in the past year (date may be approximate).     Assessment:   This is a routine wellness examination for Sammi. The goal of the wellness visit is to assist the patient how to close the gaps in care and create a preventative care plan for the patient.   The roster of all physicians providing medical care to patient is listed in the Snapshot section of the chart.  Osteoporosis risk reviewed.    Safety issues reviewed; Smoke and carbon monoxide detectors in the home. No firearms in the home.  Wears seatbelts when riding with others. Patient does wear sunscreen or protective clothing when in direct sunlight. No violence in the home.  Patient is alert, normal appearance, oriented to person/place/and time.  Correctly identified the president of the [place], recall of 2/3 words, and performing simple calculations. Displays appropriate judgement and can read correct time from watch face.   No new identified risk were noted.  No failures at ADL's or IADL's.    BMI- discussed the importance of a healthy diet, water intake and the benefits of aerobic exercise. Educational material provided.   24 hour diet recall: Breakfast: none Lunch: pork chop sandwich Dinner: bowl of noodles  Daily fluid  intake: 2  cups of caffeine, 1 cups of water.  She has a fluid restriction of 32oz.  Eye- Visual acuity not assessed per patient preference since they have regular follow up with the ophthalmologist.  Wears corrective lenses.  Sleep patterns- Sleeps 5 hours at night.  Wakes feeling rested.    Influenza vaccine administered R deltoid, tolerated well. Educational material provided.  Patient Concerns: None at this time. Follow up with PCP as needed.  Hearing/Vision screen Hearing Screening Comments: Patient is able to hear conversational tones without difficulty.  No issues reported.   Vision Screening Comments: Followed by Dr. Marvel Plan Wears corrective lenses Last every 6 months Cataract extraction, bilateral Visual acuity not assessed per patient preference since they have regular follow up with the ophthalmologist  Dietary issues and exercise activities discussed: Current Exercise Habits: Home exercise routine, Type of exercise: stretching (Standing exercises), Time (Minutes): 15, Frequency (Times/Week): 3, Weekly Exercise (Minutes/Week): 45, Intensity: Mild  Goals    . Increase water intake          Fluid restriction of 32 oz Less mountain dew, more water      Depression Screen PHQ 2/9 Scores 02/24/2017 01/15/2017 09/04/2016 07/03/2016 03/31/2016 03/24/2016 02/20/2016  PHQ - 2 Score 1 0 0 0 0 1 0    Fall Risk Fall Risk  02/24/2017 01/15/2017 09/04/2016 07/03/2016 03/31/2016  Falls in the past year? No No No No No  Number falls in past yr: - - - - -  Injury with Fall? - - - - -  Risk for fall due to : - - - - -  Follow up - - - - -    Cognitive Function:     6CIT Screen 02/24/2017  What Year? 0 points  What month? 0 points  What time? 0 points  Count back from 20 0 points  Months in reverse 0 points  Repeat phrase 0 points  Total Score 0    Screening Tests Health Maintenance  Topic Date Due  . PNEUMOCOCCAL POLYSACCHARIDE VACCINE (2) 06/10/2015  . FOOT EXAM   12/19/2015  . HEMOGLOBIN A1C  11/20/2016  . INFLUENZA VACCINE  01/07/2017  . COLONOSCOPY  07/08/2017  . OPHTHALMOLOGY EXAM  11/27/2017  . MAMMOGRAM  01/06/2018  . PAP SMEAR  11/15/2019  . TETANUS/TDAP  06/09/2020  . Hepatitis C Screening  Completed  . HIV Screening  Completed      Plan:   End of life planning; Advanced aging; Advanced directives discussed.  No HCPOA/Living Will.  Additional information declined at this time.  I have personally reviewed and noted the following in the patient's chart:   . Medical and social history . Use of alcohol, tobacco or illicit drugs  . Current medications and supplements . Functional ability and status . Nutritional status . Physical activity . Advanced directives . List of other physicians . Hospitalizations, surgeries, and ER visits in previous 12 months . Vitals . Screenings to include cognitive, depression, and falls . Referrals and appointments  In addition, I have reviewed and discussed with patient certain preventive protocols, quality metrics, and best practice recommendations. A written personalized care plan for preventive services as well as general preventive health recommendations were provided to patient.     Varney Biles, LPN   2/44/0102    Agree with plan. Mable Paris, NP

## 2017-02-24 NOTE — Patient Instructions (Addendum)
  Theresa [name] , Thank you for taking time to come for your Medicare Wellness Visit. I appreciate your ongoing commitment to your health goals. Please review the following plan we discussed and let me know if I can assist you in the future.   These are the goals we discussed: Goals    . Increase water intake          Fluid restriction of 32 oz Less mountain dew, more water       This is a list of the screening recommended for you and due dates:  Health Maintenance  Topic Date Due  . Pneumococcal vaccine (2) 06/10/2015  . Complete foot exam   12/19/2015  . Hemoglobin A1C  11/20/2016  . Flu Shot  01/07/2017  . Colon Cancer Screening  07/08/2017  . Eye exam for diabetics  11/27/2017  . Mammogram  01/06/2018  . Pap Smear  11/15/2019  . Tetanus Vaccine  06/09/2020  .  Hepatitis C: One time screening is recommended by Center for Disease Control  (CDC) for  adults born from [birth year] through 1963.   Completed  . HIV Screening  Completed

## 2017-02-25 DIAGNOSIS — N186 End stage renal disease: Secondary | ICD-10-CM | POA: Diagnosis not present

## 2017-02-25 DIAGNOSIS — Z992 Dependence on renal dialysis: Secondary | ICD-10-CM | POA: Diagnosis not present

## 2017-02-25 DIAGNOSIS — D631 Anemia in chronic kidney disease: Secondary | ICD-10-CM | POA: Diagnosis not present

## 2017-02-25 DIAGNOSIS — D509 Iron deficiency anemia, unspecified: Secondary | ICD-10-CM | POA: Diagnosis not present

## 2017-02-25 DIAGNOSIS — N2581 Secondary hyperparathyroidism of renal origin: Secondary | ICD-10-CM | POA: Diagnosis not present

## 2017-02-26 DIAGNOSIS — M5416 Radiculopathy, lumbar region: Secondary | ICD-10-CM | POA: Diagnosis not present

## 2017-02-26 DIAGNOSIS — M5136 Other intervertebral disc degeneration, lumbar region: Secondary | ICD-10-CM | POA: Diagnosis not present

## 2017-02-27 DIAGNOSIS — D509 Iron deficiency anemia, unspecified: Secondary | ICD-10-CM | POA: Diagnosis not present

## 2017-02-27 DIAGNOSIS — D631 Anemia in chronic kidney disease: Secondary | ICD-10-CM | POA: Diagnosis not present

## 2017-02-27 DIAGNOSIS — Z992 Dependence on renal dialysis: Secondary | ICD-10-CM | POA: Diagnosis not present

## 2017-02-27 DIAGNOSIS — N186 End stage renal disease: Secondary | ICD-10-CM | POA: Diagnosis not present

## 2017-02-27 DIAGNOSIS — N2581 Secondary hyperparathyroidism of renal origin: Secondary | ICD-10-CM | POA: Diagnosis not present

## 2017-03-02 ENCOUNTER — Other Ambulatory Visit: Payer: Self-pay | Admitting: Family

## 2017-03-02 DIAGNOSIS — D509 Iron deficiency anemia, unspecified: Secondary | ICD-10-CM | POA: Diagnosis not present

## 2017-03-02 DIAGNOSIS — I10 Essential (primary) hypertension: Secondary | ICD-10-CM

## 2017-03-02 DIAGNOSIS — N186 End stage renal disease: Secondary | ICD-10-CM | POA: Diagnosis not present

## 2017-03-02 DIAGNOSIS — N2581 Secondary hyperparathyroidism of renal origin: Secondary | ICD-10-CM | POA: Diagnosis not present

## 2017-03-02 DIAGNOSIS — D631 Anemia in chronic kidney disease: Secondary | ICD-10-CM | POA: Diagnosis not present

## 2017-03-02 DIAGNOSIS — Z992 Dependence on renal dialysis: Secondary | ICD-10-CM | POA: Diagnosis not present

## 2017-03-04 DIAGNOSIS — D509 Iron deficiency anemia, unspecified: Secondary | ICD-10-CM | POA: Diagnosis not present

## 2017-03-04 DIAGNOSIS — N2581 Secondary hyperparathyroidism of renal origin: Secondary | ICD-10-CM | POA: Diagnosis not present

## 2017-03-04 DIAGNOSIS — Z992 Dependence on renal dialysis: Secondary | ICD-10-CM | POA: Diagnosis not present

## 2017-03-04 DIAGNOSIS — N186 End stage renal disease: Secondary | ICD-10-CM | POA: Diagnosis not present

## 2017-03-04 DIAGNOSIS — D631 Anemia in chronic kidney disease: Secondary | ICD-10-CM | POA: Diagnosis not present

## 2017-03-05 ENCOUNTER — Ambulatory Visit (INDEPENDENT_AMBULATORY_CARE_PROVIDER_SITE_OTHER): Payer: Medicare Other | Admitting: Vascular Surgery

## 2017-03-05 ENCOUNTER — Ambulatory Visit: Payer: Medicare Other | Attending: Family | Admitting: Family

## 2017-03-05 ENCOUNTER — Encounter (INDEPENDENT_AMBULATORY_CARE_PROVIDER_SITE_OTHER): Payer: Self-pay | Admitting: Vascular Surgery

## 2017-03-05 ENCOUNTER — Encounter: Payer: Self-pay | Admitting: Family

## 2017-03-05 VITALS — BP 179/81 | HR 74 | Resp 16 | Ht 67.5 in | Wt 174.0 lb

## 2017-03-05 VITALS — BP 174/62 | HR 72 | Resp 18 | Ht 67.0 in | Wt 173.5 lb

## 2017-03-05 DIAGNOSIS — I10 Essential (primary) hypertension: Secondary | ICD-10-CM | POA: Diagnosis not present

## 2017-03-05 DIAGNOSIS — I132 Hypertensive heart and chronic kidney disease with heart failure and with stage 5 chronic kidney disease, or end stage renal disease: Secondary | ICD-10-CM | POA: Insufficient documentation

## 2017-03-05 DIAGNOSIS — Z86010 Personal history of colonic polyps: Secondary | ICD-10-CM | POA: Diagnosis not present

## 2017-03-05 DIAGNOSIS — Z885 Allergy status to narcotic agent status: Secondary | ICD-10-CM | POA: Diagnosis not present

## 2017-03-05 DIAGNOSIS — Z800 Family history of malignant neoplasm of digestive organs: Secondary | ICD-10-CM | POA: Insufficient documentation

## 2017-03-05 DIAGNOSIS — N186 End stage renal disease: Secondary | ICD-10-CM | POA: Diagnosis not present

## 2017-03-05 DIAGNOSIS — Z823 Family history of stroke: Secondary | ICD-10-CM | POA: Diagnosis not present

## 2017-03-05 DIAGNOSIS — Z9889 Other specified postprocedural states: Secondary | ICD-10-CM | POA: Insufficient documentation

## 2017-03-05 DIAGNOSIS — Z87891 Personal history of nicotine dependence: Secondary | ICD-10-CM | POA: Insufficient documentation

## 2017-03-05 DIAGNOSIS — I25118 Atherosclerotic heart disease of native coronary artery with other forms of angina pectoris: Secondary | ICD-10-CM | POA: Diagnosis not present

## 2017-03-05 DIAGNOSIS — E785 Hyperlipidemia, unspecified: Secondary | ICD-10-CM | POA: Diagnosis not present

## 2017-03-05 DIAGNOSIS — Z8249 Family history of ischemic heart disease and other diseases of the circulatory system: Secondary | ICD-10-CM | POA: Diagnosis not present

## 2017-03-05 DIAGNOSIS — Z79899 Other long term (current) drug therapy: Secondary | ICD-10-CM | POA: Insufficient documentation

## 2017-03-05 DIAGNOSIS — I252 Old myocardial infarction: Secondary | ICD-10-CM | POA: Diagnosis not present

## 2017-03-05 DIAGNOSIS — I2510 Atherosclerotic heart disease of native coronary artery without angina pectoris: Secondary | ICD-10-CM | POA: Diagnosis not present

## 2017-03-05 DIAGNOSIS — E11319 Type 2 diabetes mellitus with unspecified diabetic retinopathy without macular edema: Secondary | ICD-10-CM | POA: Insufficient documentation

## 2017-03-05 DIAGNOSIS — Z992 Dependence on renal dialysis: Secondary | ICD-10-CM | POA: Diagnosis not present

## 2017-03-05 DIAGNOSIS — D573 Sickle-cell trait: Secondary | ICD-10-CM | POA: Diagnosis not present

## 2017-03-05 DIAGNOSIS — Z7982 Long term (current) use of aspirin: Secondary | ICD-10-CM | POA: Insufficient documentation

## 2017-03-05 DIAGNOSIS — E1122 Type 2 diabetes mellitus with diabetic chronic kidney disease: Secondary | ICD-10-CM | POA: Insufficient documentation

## 2017-03-05 DIAGNOSIS — Z888 Allergy status to other drugs, medicaments and biological substances status: Secondary | ICD-10-CM | POA: Insufficient documentation

## 2017-03-05 DIAGNOSIS — Z90710 Acquired absence of both cervix and uterus: Secondary | ICD-10-CM | POA: Diagnosis not present

## 2017-03-05 DIAGNOSIS — E1140 Type 2 diabetes mellitus with diabetic neuropathy, unspecified: Secondary | ICD-10-CM | POA: Diagnosis not present

## 2017-03-05 DIAGNOSIS — I5032 Chronic diastolic (congestive) heart failure: Secondary | ICD-10-CM | POA: Diagnosis not present

## 2017-03-05 DIAGNOSIS — I209 Angina pectoris, unspecified: Secondary | ICD-10-CM

## 2017-03-05 DIAGNOSIS — J432 Centrilobular emphysema: Secondary | ICD-10-CM | POA: Diagnosis not present

## 2017-03-05 DIAGNOSIS — D571 Sickle-cell disease without crisis: Secondary | ICD-10-CM | POA: Insufficient documentation

## 2017-03-05 DIAGNOSIS — Z955 Presence of coronary angioplasty implant and graft: Secondary | ICD-10-CM | POA: Insufficient documentation

## 2017-03-05 DIAGNOSIS — M79604 Pain in right leg: Secondary | ICD-10-CM

## 2017-03-05 DIAGNOSIS — M79605 Pain in left leg: Secondary | ICD-10-CM

## 2017-03-05 DIAGNOSIS — I6523 Occlusion and stenosis of bilateral carotid arteries: Secondary | ICD-10-CM | POA: Diagnosis not present

## 2017-03-05 DIAGNOSIS — I739 Peripheral vascular disease, unspecified: Secondary | ICD-10-CM | POA: Diagnosis not present

## 2017-03-05 DIAGNOSIS — Z833 Family history of diabetes mellitus: Secondary | ICD-10-CM | POA: Insufficient documentation

## 2017-03-05 DIAGNOSIS — Z803 Family history of malignant neoplasm of breast: Secondary | ICD-10-CM | POA: Insufficient documentation

## 2017-03-05 NOTE — Patient Instructions (Signed)
Continue weighing daily and call for an overnight weight gain of > 2 pounds or a weekly weight gain of >5 pounds. 

## 2017-03-05 NOTE — Progress Notes (Signed)
Agree with pharmacist note below.  Physical exam and ROS were done by myself.  Weighing daily and sees some fluctuations depending on whether it's dialysis days or not. Has received her flu vaccine for this year.  Return in 6 months or sooner for any questions/problems before then.  Theresa Price, FNP HF Clinic at [facility]    Patient ID: Theresa [name], female    DOB: Sep 07, 1955, 61 y.o.   MRN: [id number]  HPI Theresa [name] is a 60 y/o female with a history of PVD, HTN, hyperlipidemia, COPD, CAD with NSTEMI, anemia, previous tobacco use, DM, ESRD on dialysis and chronic heart failure.  Last echo was done 02/26/16 which showed an EF of 55-60%, mild AS and moderate/severe MR which is worse than previous echo done on 11/24/14. Right-sided pressure elevated at 50 mm Hg.   Had colonoscopy on 07/08/2016 for transplant evaluation. Colonoscopy showed polyps that were removed and is to get another colonoscopy next January. Has family hx of colon ca. Last admitted on 05/21/16 with chest pain/near syncope. Ruled out for TIA/CVA. Head CT and MRI were negative. Carotid dopplers showed right & left ICA 50-69% stenosis. EEG normal.  Cardiology consult obtained. Isosorbide was added. Previously admitted on 02/22/16 with chest pain. Troponins were negative X 3. Cardiology consult was done. Was also diagnosed with pneumonia and treated with levaquin. Losartan was started and metoprolol increased due to HTN.  Patient presents today for a follow-up visit and is doing well. No swelling in her ankles/abdomen. Weighing daily with consistent weights. Continuing dialysis on M, W, F. Pt is O+ making transplant difficult, has been on transpant list for ~4 years. Brought in all medication bottles for review. Patient took medications 30 min prior to appointment and says she needs to refill her Coreg.   Past Medical History:  Diagnosis Date  . Anemia of chronic disease   . Carotid arterial disease ([place])    a. 02/2013 U/S: 40-59% bilat  ICA stenosis - *f/u 02/2014*  . Chronic constipation   . Chronic diastolic CHF (congestive heart failure) ([place])    a. 10/2013 Echo [facility]): EF 55-60%, mod conc LVH, mod MR, mildly dil LA, mild Ao sclerosis w/o stenosis.  . Colon polyps   . COPD (chronic obstructive pulmonary disease) ([place])   . Coronary artery disease    a. 05/2013 NSTEMI/PCI: LM 20d, LAD min irregs, LCX small, nl, OM1 nl, RCA dom 56m (2.5x16 Promus DES), PDA1 80p.  . Diabetes mellitus   . Diabetic neuropathy ([place])   . Diabetic retinopathy ([place]) 05/28/2013   Hx bilat retinal detachment, proliferative diab retinopathy and bilat vitreous hemorrhage   . Emphysema   . ESRD on hemodialysis ([place])    a. [facility] in [place], [place], on a TTS schedule.  She started dialysis in Feb 2014.  Etiology of renal failure not known, likely diabetes.  Has a left upper arm AV graft.  . History of bronchitis    Mar 2012  . History of pneumonia    June 2012  . History of tobacco abuse    a. Quit 2012.  Marland Kitchen Hyperlipidemia   . Hypertension   . Moderate mitral insufficiency    a. 10/2013 Echo: EF 55-60%, mod MR.  . Myocardial infarct ([place]) 05/2013  . Peripheral vascular disease ([place])   . Sickle cell trait [facility])    Past Surgical History:  Procedure Laterality Date  . ABDOMINAL HYSTERECTOMY     2000  . CARDIAC CATHETERIZATION    . CARDIAC CATHETERIZATION  N/A 05/02/2015   Procedure: Left Heart Cath and Coronary Angiography;  Surgeon: Theresa Hampshire, MD;  Location: [place] CV LAB;  Service: Cardiovascular;  Laterality: N/A;  . COLONOSCOPY WITH PROPOFOL N/A 07/08/2016   Procedure: COLONOSCOPY WITH PROPOFOL;  Surgeon: Theresa Bellows, MD;  Location: [facility] ENDOSCOPY;  Service: Endoscopy;  Laterality: N/A;  . colonscopy    . CORONARY ANGIOPLASTY  05/28/2014   stent placement to the mid RCA  . DILATION AND CURETTAGE OF UTERUS     several in the early 80's  . ESOPHAGOGASTRODUODENOSCOPY     2012  . EYE SURGERY     bilateral laser 2012  . EYE  SURGERY     right  . GAS INSERTION  09/30/2011   Procedure: INSERTION OF GAS;  Surgeon: Theresa Pedro, MD;  Location: [place];  Service: Ophthalmology;  Laterality: Right;  C3F8  . GAS/FLUID EXCHANGE  09/30/2011   Procedure: GAS/FLUID EXCHANGE;  Surgeon: Theresa Pedro, MD;  Location: [place];  Service: Ophthalmology;  Laterality: Right;  . LEFT HEART CATHETERIZATION WITH CORONARY ANGIOGRAM N/A 05/28/2013   Procedure: LEFT HEART CATHETERIZATION WITH CORONARY ANGIOGRAM;  Surgeon: Theresa Booze, MD;  Location: [facility] C[facility] LAB;  Service: Cardiovascular;  Laterality: N/A;  . PARS PLANA VITRECTOMY  04/22/2011   Procedure: PARS PLANA VITRECTOMY WITH 25 GAUGE;  Surgeon: Theresa Pedro, MD;  Location: [place];  Service: Ophthalmology;  Laterality: Left;  membrane peel, endolaser, gas fluid exchange, silicone oil, repair of complex traction retinal detachment  . PARS PLANA VITRECTOMY  09/30/2011   Procedure: PARS PLANA VITRECTOMY WITH 25 GAUGE;  Surgeon: Theresa Pedro, MD;  Location: [place];  Service: Ophthalmology;  Laterality: Right;  Endolaser; Repair of Complex Traction Retinal Detachment  . PARS PLANA VITRECTOMY  02/24/2012   Procedure: PARS PLANA VITRECTOMY WITH 25 GAUGE;  Surgeon: Theresa Pedro, MD;  Location: [place];  Service: Ophthalmology;  Laterality: Left;  . PTCA    . SILICON OIL REMOVAL  10/15/3265   Procedure: SILICON OIL REMOVAL;  Surgeon: Theresa Pedro, MD;  Location: [place];  Service: Ophthalmology;  Laterality: Left;  . THROMBECTOMY / ARTERIOVENOUS GRAFT REVISION    . TUBAL LIGATION     1979   Family History  Problem Relation Age of Onset  . Stroke Mother   . Heart attack Mother   . Heart disease Mother   . Colon cancer Father   . Colon cancer Sister   . Heart attack Brother   . Stroke Brother   . Diabetes Brother   . Breast cancer Sister   . Diabetes Brother   . Diabetes Daughter   . Anesthesia problems Neg Hx   . Hypotension Neg Hx   . Malignant hyperthermia Neg Hx   .  Pseudochol deficiency Neg Hx    Social History  Substance Use Topics  . Smoking status: Former Smoker    Packs/day: 1.00    Years: 25.00    Quit date: 06/09/2010  . Smokeless tobacco: Never Used  . Alcohol use No   Allergies  Allergen Reactions  . Hydrocodone Hives  . Metformin Diarrhea   Prior to Admission medications   Medication Sig Start Date End Date Taking? Authorizing Provider  amLODipine (NORVASC) 10 MG tablet Take 10 mg by mouth daily.  09/17/16  Yes [provider]  aspirin EC 81 MG tablet Take 81 mg by mouth daily.   Yes [provider]  atorvastatin (LIPITOR) 40 MG tablet Take  1 tablet (40 mg total) by mouth daily. Patient taking differently: Take 40 mg by mouth daily at 6 PM.  07/03/16 03/05/17 Yes Hackney, Aura Fey, FNP  carvedilol (COREG) 12.5 MG tablet Take 12.5 mg by mouth 2 (two) times daily with a meal.  12/16/16  Yes [provider]  carvedilol (COREG) 12.5 MG tablet IF BLOOD PRESSURE GREATER THAN 160, TAKE 2 TABLETS BY MOUTH TWICE DAILY, OTHERWISE TAKE 1 TABLET BY MOUTH TWICE DAILY. 03/02/17  Yes Theresa [name] A, FNP  cinacalcet (SENSIPAR) 30 MG tablet Take 30 mg by mouth daily.   Yes [provider]  cloNIDine (CATAPRES) 0.1 MG tablet Take 0.1 mg by mouth 2 (two) times daily.   Yes [provider]  furosemide (LASIX) 80 MG tablet Take 80 mg by mouth daily.    Yes [provider]  isosorbide mononitrate (IMDUR) 60 MG 24 hr tablet Take 1 tablet (60 mg total) by mouth daily. 07/03/16 03/05/17 Yes Hackney, Otila Kluver A, FNP  losartan (COZAAR) 50 MG tablet Take 50 mg by mouth daily.  12/16/16  Yes [provider]  Melatonin 1 MG CAPS Take 10 mg by mouth daily.    Yes [provider]  multivitamin (RENA-VIT) TABS tablet TAKE ONE TABLET BY MOUTH ONCE DAILY 11/09/16  Yes Theresa [name] A, FNP  nitroGLYCERIN (NITROSTAT) 0.4 MG SL tablet Place 1 tablet (0.4 mg total) under the tongue every 5 (five) minutes as needed for  chest pain. 01/15/17  Yes Burnard Hawthorne, FNP  pregabalin (LYRICA) 75 MG capsule Take 1 capsule (75 mg total) by mouth daily. 04/21/16  Yes Arnett, Yvetta Coder, FNP  albuterol (PROVENTIL HFA;VENTOLIN HFA) 108 (90 BASE) MCG/ACT inhaler Inhale 2 puffs into the lungs every 6 (six) hours as needed for wheezing or shortness of breath.    [provider]  albuterol (PROVENTIL) (2.5 MG/3ML) 0.083% nebulizer solution Take 2.5 mg by nebulization every 4 (four) hours as needed for wheezing or shortness of breath.    [provider]  naproxen sodium (ANAPROX) 220 MG tablet Take 220 mg by mouth daily as needed.    [provider]   Review of Systems  Constitutional: Negative for appetite change and fatigue.  HENT: Positive for congestion and postnasal drip. Negative for sore throat.   Eyes: Negative.   Respiratory: Negative for chest tightness, shortness of breath and wheezing.   Cardiovascular: Negative for chest pain, palpitations and leg swelling.  Gastrointestinal: Negative for abdominal distention and abdominal pain.       X2 days  Endocrine: Negative.   Genitourinary: Negative.   Musculoskeletal: Positive for back pain. Negative for neck pain.  Skin: Negative.   Allergic/Immunologic: Negative.   Neurological: Negative for weakness, light-headedness and headaches.  Hematological: Negative for adenopathy. Does not bruise/bleed easily.  Psychiatric/Behavioral: Negative for dysphoric mood and sleep disturbance. The patient is not nervous/anxious.        Using melatonin at bedtime   Vitals:   03/05/17 0908  BP: (!) 174/62  Pulse: 72  Resp: 18  SpO2: 100%  Weight: 173 lb 8 oz (78.7 kg)  Height: 5\' 7"  (1.702 m)   Wt Readings from Last 3 Encounters:  03/05/17 173 lb 8 oz (78.7 kg)  02/24/17 173 lb 12.8 oz (78.8 kg)  02/17/17 174 lb 12 oz (79.3 kg)   Lab Results  Component Value Date   CREATININE 4.98 (H) 05/21/2016   CREATININE 10.11 (H) 02/23/2016   CREATININE  9.16 (H) 02/22/2016  Physical Exam  Constitutional: She is oriented to person, place, and time. She appears well-developed and well-nourished.  HENT:  Head: Normocephalic and atraumatic.  Neck: Normal range of motion. Neck supple. No JVD present.  Cardiovascular: Normal rate and regular rhythm.   Pulmonary/Chest: Effort normal. She has no wheezes. She has no rales.  Abdominal: Soft. She exhibits no distension. There is no tenderness.  Musculoskeletal: She exhibits no edema or tenderness.  Neurological: She is alert and oriented to person, place, and time.  Skin: Skin is warm and dry.  Psychiatric: She has a normal mood and affect. Her behavior is normal. Thought content normal.  Nursing note and vitals reviewed.    Assessment & Plan: Has received yearly flu vaccine   1: Chronic heart failure with preserved ejection fraction- - NYHA class I  - euvolemic  - continue daily weights and call for an overnight weight gain of >2 pounds/weekly weight gain of >5 pounds. Weight has been consistent - tries not to salt foods; has been using salt substitute, when eats canned vegetables will thoroughly rinse them, recommended <2000 mg Na/daily - Dialysis MWF. Says she gains ~3 pounds over the weekend and ~2 pounds on the days she does not have dialysis.  - Last saw cardiology Rockey Situ) 02/17/17.  - Has appointment with Vascular today regarding right leg turning numb during dialysis sessions   2: HTN- - BP elevated 174/62, and took all medications 30 min prior to appointment - Checks bp at home 3x a week typically sees ~140/60's  3: COPD- - oxygen at 2L as needed  - Will follow up with PCP 03/18/17 regarding new prescription for albuterol; has not needed to use in over a year but says the one she has is expired and not sure if she has any refills left   4: ESRD on hemodialysis- - dialysis on M, W, F - denies difficulty with dialysis - has been on transplant list for ~4 years now  Medication  bottles were reviewed. Return here in 6 months or sooner for any questions/problems before then.   Candelaria Stagers, PharmD  Pharmacy Resident 03/05/2017 9:35 AM

## 2017-03-06 DIAGNOSIS — Z992 Dependence on renal dialysis: Secondary | ICD-10-CM | POA: Diagnosis not present

## 2017-03-06 DIAGNOSIS — D631 Anemia in chronic kidney disease: Secondary | ICD-10-CM | POA: Diagnosis not present

## 2017-03-06 DIAGNOSIS — D509 Iron deficiency anemia, unspecified: Secondary | ICD-10-CM | POA: Diagnosis not present

## 2017-03-06 DIAGNOSIS — N2581 Secondary hyperparathyroidism of renal origin: Secondary | ICD-10-CM | POA: Diagnosis not present

## 2017-03-06 DIAGNOSIS — N186 End stage renal disease: Secondary | ICD-10-CM | POA: Diagnosis not present

## 2017-03-07 DIAGNOSIS — I739 Peripheral vascular disease, unspecified: Secondary | ICD-10-CM | POA: Insufficient documentation

## 2017-03-07 NOTE — Progress Notes (Signed)
MRN : [id number]  Theresa [name] is a 61 y.o. (04/20/1956) female who presents with chief complaint of  Chief Complaint  Patient presents with  . Follow-up    EST. Right LE claudication  .  History of Present Illness: The patient is seen for evaluation of painful lower extremities. Patient notes the pain is variable and not always associated with activity.  The pain is somewhat consistent day to day occurring on most days. The patient notes the pain also occurs with standing and routinely seems worse as the day wears on. The pain has been progressive over the past several years. The patient states these symptoms are causing  a profound negative impact on quality of life and daily activities.  The patient denies rest pain or dangling of an extremity off the side of the bed during the night for relief. No open wounds or sores at this time. No history of DVT or phlebitis. No prior interventions or surgeries.  There is a  history of back problems and DJD of the lumbar and sacral spine.    No outpatient prescriptions have been marked as taking for the 03/05/17 encounter (Office Visit) with Delana Meyer, Dolores Lory, MD.   Current Facility-Administered Medications for the 03/05/17 encounter (Office Visit) with Delana Meyer, Dolores Lory, MD  Medication  . betamethasone acetate-betamethasone sodium phosphate (CELESTONE) injection 3 mg    Past Medical History:  Diagnosis Date  . Anemia of chronic disease   . Carotid arterial disease ([place])    a. 02/2013 U/S: 40-59% bilat ICA stenosis - *f/u 02/2014*  . Chronic constipation   . Chronic diastolic CHF (congestive heart failure) ([place])    a. 10/2013 Echo [facility]): EF 55-60%, mod conc LVH, mod MR, mildly dil LA, mild Ao sclerosis w/o stenosis.  . Colon polyps   . COPD (chronic obstructive pulmonary disease) ([place])   . Coronary artery disease    a. 05/2013 NSTEMI/PCI: LM 20d, LAD min irregs, LCX small, nl, OM1 nl, RCA dom 68m (2.5x16 Promus DES), PDA1 80p.  .  Diabetes mellitus   . Diabetic neuropathy ([place])   . Diabetic retinopathy ([place]) 05/28/2013   Hx bilat retinal detachment, proliferative diab retinopathy and bilat vitreous hemorrhage   . Emphysema   . ESRD on hemodialysis ([place])    a. [facility] in [place], [place], on a TTS schedule.  She started dialysis in Feb 2014.  Etiology of renal failure not known, likely diabetes.  Has a left upper arm AV graft.  . History of bronchitis    Mar 2012  . History of pneumonia    June 2012  . History of tobacco abuse    a. Quit 2012.  Marland Kitchen Hyperlipidemia   . Hypertension   . Moderate mitral insufficiency    a. 10/2013 Echo: EF 55-60%, mod MR.  . Myocardial infarct ([place]) 05/2013  . Peripheral vascular disease ([place])   . Sickle cell trait [facility])     Past Surgical History:  Procedure Laterality Date  . ABDOMINAL HYSTERECTOMY     2000  . CARDIAC CATHETERIZATION    . CARDIAC CATHETERIZATION N/A 05/02/2015   Procedure: Left Heart Cath and Coronary Angiography;  Surgeon: [name], MD;  Location: [place] CV LAB;  Service: Cardiovascular;  Laterality: N/A;  . COLONOSCOPY WITH PROPOFOL N/A 07/08/2016   Procedure: COLONOSCOPY WITH PROPOFOL;  Surgeon: [name], MD;  Location: [facility] ENDOSCOPY;  Service: Endoscopy;  Laterality: N/A;  . colonscopy    . CORONARY ANGIOPLASTY  05/28/2014  stent placement to the mid RCA  . DILATION AND CURETTAGE OF UTERUS     several in the early 80's  . ESOPHAGOGASTRODUODENOSCOPY     2012  . EYE SURGERY     bilateral laser 2012  . EYE SURGERY     right  . GAS INSERTION  09/30/2011   Procedure: INSERTION OF GAS;  Surgeon: [name], MD;  Location: [place];  Service: Ophthalmology;  Laterality: Right;  C3F8  . GAS/FLUID EXCHANGE  09/30/2011   Procedure: GAS/FLUID EXCHANGE;  Surgeon: [name], MD;  Location: [place];  Service: Ophthalmology;  Laterality: Right;  . LEFT HEART CATHETERIZATION WITH CORONARY ANGIOGRAM N/A 05/28/2013   Procedure: LEFT HEART  CATHETERIZATION WITH CORONARY ANGIOGRAM;  Surgeon: [name], MD;  Location: [facility] CATH LAB;  Service: Cardiovascular;  Laterality: N/A;  . PARS PLANA VITRECTOMY  04/22/2011   Procedure: PARS PLANA VITRECTOMY WITH 25 GAUGE;  Surgeon: [name], MD;  Location: [place];  Service: Ophthalmology;  Laterality: Left;  membrane peel, endolaser, gas fluid exchange, silicone oil, repair of complex traction retinal detachment  . PARS PLANA VITRECTOMY  09/30/2011   Procedure: PARS PLANA VITRECTOMY WITH 25 GAUGE;  Surgeon: [name], MD;  Location: [place];  Service: Ophthalmology;  Laterality: Right;  Endolaser; Repair of Complex Traction Retinal Detachment  . PARS PLANA VITRECTOMY  02/24/2012   Procedure: PARS PLANA VITRECTOMY WITH 25 GAUGE;  Surgeon: [name], MD;  Location: [place];  Service: Ophthalmology;  Laterality: Left;  . PTCA    . SILICON OIL REMOVAL  7/98/9211   Procedure: SILICON OIL REMOVAL;  Surgeon: [name], MD;  Location: [place];  Service: Ophthalmology;  Laterality: Left;  . THROMBECTOMY / ARTERIOVENOUS GRAFT REVISION    . TUBAL LIGATION     1979    Social History Social History  Substance Use Topics  . Smoking status: Former Smoker    Packs/day: 1.00    Years: 25.00    Quit date: 06/09/2010  . Smokeless tobacco: Never Used  . Alcohol use No    Family History Family History  Problem Relation Age of Onset  . Stroke Mother   . Heart attack Mother   . Heart disease Mother   . Colon cancer Father   . Colon cancer Sister   . Heart attack Brother   . Stroke Brother   . Diabetes Brother   . Breast cancer Sister   . Diabetes Brother   . Diabetes Daughter   . Anesthesia problems Neg Hx   . Hypotension Neg Hx   . Malignant hyperthermia Neg Hx   . Pseudochol deficiency Neg Hx     Allergies  Allergen Reactions  . Hydrocodone Hives  . Metformin Diarrhea     REVIEW OF SYSTEMS (Negative unless checked)  Constitutional: [] Weight loss  [] Fever   [] Chills Cardiac: [] Chest pain   [] Chest pressure   [] Palpitations   [] Shortness of breath when laying flat   [] Shortness of breath with exertion. Vascular:  [x] Pain in legs with walking   [] Pain in legs at rest  [] History of DVT   [] Phlebitis   [] Swelling in legs   [] Varicose veins   [] Non-healing ulcers Pulmonary:   [] Uses home oxygen   [] Productive cough   [] Hemoptysis   [] Wheeze  [] COPD   [] Asthma Neurologic:  [] Dizziness   [] Seizures   [] History of stroke   [] History of TIA  [] Aphasia   [] Vissual changes   [] Weakness or  numbness in arm   [] Weakness or numbness in leg Musculoskeletal:   [] Joint swelling   [x] Joint pain   [x] Low back pain Hematologic:  [] Easy bruising  [] Easy bleeding   [] Hypercoagulable state   [] Anemic Gastrointestinal:  [] Diarrhea   [] Vomiting  [] Gastroesophageal reflux/heartburn   [] Difficulty swallowing. Genitourinary:  [x] Chronic kidney disease   [] Difficult urination  [] Frequent urination   [] Blood in urine Skin:  [] Rashes   [] Ulcers  Psychological:  [] History of anxiety   []  History of major depression.  Physical Examination  Vitals:   03/05/17 1522  BP: (!) 179/81  Pulse: 74  Resp: 16  Weight: 78.9 kg (174 lb)  Height: 5' 7.5" (1.715 m)   Body mass index is 26.85 kg/m. Gen: WD/WN, NAD Head: [place]/AT, No temporalis wasting.  Ear/Nose/Throat: Hearing grossly intact, nares w/o erythema or drainage Eyes: PER, EOMI, sclera nonicteric.  Neck: Supple, no large masses.   Pulmonary:  Good air movement, no audible wheezing bilaterally, no use of accessory muscles.  Cardiac: RRR, no JVD; 4/5 SEM Vascular: bilateral carotid bruit; left arm good thrill good bruit Vessel Right Left  Radial Palpable Palpable  PT Not Palpable notPalpable  DP Palpable Palpable  Gastrointestinal: Non-distended. No guarding/no peritoneal signs.  Musculoskeletal: M/S 5/5 throughout.  No deformity or atrophy.  Neurologic: CN 2-12 intact. Symmetrical.  Speech is fluent. Motor exam as listed  above. Psychiatric: Judgment intact, Mood & affect appropriate for pt's clinical situation. Dermatologic: No rashes or ulcers noted.  No changes consistent with cellulitis. Lymph : No lichenification or skin changes of chronic lymphedema.  CBC Lab Results  Component Value Date   WBC 7.5 05/21/2016   HGB 11.5 (L) 05/21/2016   HCT 33.6 (L) 05/21/2016   MCV 99.6 05/21/2016   PLT 155 05/21/2016    BMET    Component Value Date/Time   NA 138 05/21/2016 1405   NA 143 07/27/2015 1737   NA 137 05/14/2014 0741   K 4.5 07/09/2016 1158   K 3.3 (L) 05/14/2014 1500   CL 98 (L) 05/21/2016 1405   CL 98 05/14/2014 0741   CO2 31 05/21/2016 1405   CO2 35 (H) 05/14/2014 0741   GLUCOSE 232 (H) 05/21/2016 1405   GLUCOSE 96 05/14/2014 0741   BUN 27 (H) 05/21/2016 1405   BUN 25 (H) 05/14/2014 0741   CREATININE 4.98 (H) 05/21/2016 1405   CREATININE 5.83 (H) 05/14/2014 0741   CALCIUM 8.4 (L) 05/21/2016 1405   CALCIUM 8.9 05/14/2014 0741   GFRNONAA 9 (L) 05/21/2016 1405   GFRNONAA 8 (L) 05/14/2014 0741   GFRNONAA 4 (L) 02/15/2014 0837   GFRAA 10 (L) 05/21/2016 1405   GFRAA 10 (L) 05/14/2014 0741   GFRAA 5 (L) 02/15/2014 0837   CrCl cannot be calculated (Patient's most recent lab result is older than the maximum 21 days allowed.).  COAG Lab Results  Component Value Date   INR 1.17 05/02/2015   INR 1.0 10/10/2013   INR 1.0 05/12/2013    Radiology No results found.  Assessment/Plan 1. Pain in both lower extremities  Recommend:  The patient has atypical pain symptoms for pure atherosclerotic disease. However, on physical exam there is evidence of mixed venous and arterial disease, given the diminished pulses and the edema associated with venous changes of the legs.  Noninvasive studies including ABI's and aorta iliac ultrasound of the legs will be obtained and the patient will follow up with me to review these studies.  The patient should continue walking and begin  a more formal  exercise program. The patient should continue his antiplatelet therapy and aggressive treatment of the lipid abnormalities.  The patient should begin wearing graduated compression socks 15-20 mmHg strength to control edema.   - VAS US AORTA/IVC/ILIACS; Future - VAS [place] ABI WITH/WO TBI; Future  2. PAD (peripheral artery disease) ([place]) See #1  3. Bilateral carotid artery stenosis Recommend:  Given the patient's asymptomatic subcritical stenosis no further invasive testing or surgery at this time.  Continue antiplatelet therapy as prescribed Continue management of CAD, HTN and Hyperlipidemia Healthy heart diet,  encouraged exercise at least 4 times per week Follow up in 6 months with duplex ultrasound and physical exam    4. ESRD on hemodialysis [facility]) Recommend:  The patient is doing well and currently has adequate dialysis access. The patient's dialysis center is not reporting any access issues. Flow pattern is stable when compared to the prior ultrasound.  The patient should have a duplex ultrasound of the dialysis access  The patient will follow-up with me in the office after each ultrasound    5. Coronary artery disease of native artery of native heart with stable angina pectoris (HCC) Continue cardiac and antihypertensive medications as already ordered and reviewed, no changes at this time.  Continue statin as ordered and reviewed, no changes at this time  Nitrates PRN for chest pain   6. Essential (primary) hypertension Continue antihypertensive medications as already ordered, these medications have been reviewed and there are no changes at this time.     Hortencia Pilar, MD  03/07/2017 2:49 PM

## 2017-03-08 DIAGNOSIS — N186 End stage renal disease: Secondary | ICD-10-CM | POA: Diagnosis not present

## 2017-03-08 DIAGNOSIS — Z992 Dependence on renal dialysis: Secondary | ICD-10-CM | POA: Diagnosis not present

## 2017-03-09 DIAGNOSIS — D631 Anemia in chronic kidney disease: Secondary | ICD-10-CM | POA: Diagnosis not present

## 2017-03-09 DIAGNOSIS — Z992 Dependence on renal dialysis: Secondary | ICD-10-CM | POA: Diagnosis not present

## 2017-03-09 DIAGNOSIS — N186 End stage renal disease: Secondary | ICD-10-CM | POA: Diagnosis not present

## 2017-03-09 DIAGNOSIS — D509 Iron deficiency anemia, unspecified: Secondary | ICD-10-CM | POA: Diagnosis not present

## 2017-03-09 DIAGNOSIS — N2581 Secondary hyperparathyroidism of renal origin: Secondary | ICD-10-CM | POA: Diagnosis not present

## 2017-03-11 ENCOUNTER — Emergency Department
Admission: EM | Admit: 2017-03-11 | Discharge: 2017-03-11 | Disposition: A | Discharge status: Home / Self Care | Payer: Medicare Other | Attending: Emergency Medicine | Admitting: Emergency Medicine

## 2017-03-11 ENCOUNTER — Encounter: Payer: Self-pay | Admitting: Intensive Care

## 2017-03-11 ENCOUNTER — Emergency Department: Payer: Medicare Other

## 2017-03-11 DIAGNOSIS — D631 Anemia in chronic kidney disease: Secondary | ICD-10-CM | POA: Diagnosis not present

## 2017-03-11 DIAGNOSIS — I2510 Atherosclerotic heart disease of native coronary artery without angina pectoris: Secondary | ICD-10-CM | POA: Insufficient documentation

## 2017-03-11 DIAGNOSIS — E11319 Type 2 diabetes mellitus with unspecified diabetic retinopathy without macular edema: Secondary | ICD-10-CM | POA: Insufficient documentation

## 2017-03-11 DIAGNOSIS — J449 Chronic obstructive pulmonary disease, unspecified: Secondary | ICD-10-CM | POA: Diagnosis not present

## 2017-03-11 DIAGNOSIS — N186 End stage renal disease: Secondary | ICD-10-CM | POA: Diagnosis not present

## 2017-03-11 DIAGNOSIS — E1140 Type 2 diabetes mellitus with diabetic neuropathy, unspecified: Secondary | ICD-10-CM | POA: Insufficient documentation

## 2017-03-11 DIAGNOSIS — N2581 Secondary hyperparathyroidism of renal origin: Secondary | ICD-10-CM | POA: Diagnosis not present

## 2017-03-11 DIAGNOSIS — F17210 Nicotine dependence, cigarettes, uncomplicated: Secondary | ICD-10-CM | POA: Diagnosis not present

## 2017-03-11 DIAGNOSIS — I5030 Unspecified diastolic (congestive) heart failure: Secondary | ICD-10-CM | POA: Insufficient documentation

## 2017-03-11 DIAGNOSIS — R079 Chest pain, unspecified: Secondary | ICD-10-CM | POA: Diagnosis not present

## 2017-03-11 DIAGNOSIS — I132 Hypertensive heart and chronic kidney disease with heart failure and with stage 5 chronic kidney disease, or end stage renal disease: Secondary | ICD-10-CM | POA: Diagnosis not present

## 2017-03-11 DIAGNOSIS — Z992 Dependence on renal dialysis: Secondary | ICD-10-CM | POA: Insufficient documentation

## 2017-03-11 DIAGNOSIS — Z79899 Other long term (current) drug therapy: Secondary | ICD-10-CM | POA: Insufficient documentation

## 2017-03-11 DIAGNOSIS — I6529 Occlusion and stenosis of unspecified carotid artery: Secondary | ICD-10-CM | POA: Diagnosis not present

## 2017-03-11 DIAGNOSIS — D509 Iron deficiency anemia, unspecified: Secondary | ICD-10-CM | POA: Diagnosis not present

## 2017-03-11 LAB — PROTIME-INR
INR: 1
Prothrombin Time: 13.1 seconds (ref 11.4–15.2)

## 2017-03-11 LAB — CBC
HCT: 36.9 % (ref 35.0–47.0)
Hemoglobin: 12.5 g/dL (ref 12.0–16.0)
MCH: 35.1 pg — AB (ref 26.0–34.0)
MCHC: 33.9 g/dL (ref 32.0–36.0)
MCV: 103.6 fL — AB (ref 80.0–100.0)
PLATELETS: 145 10*3/uL — AB (ref 150–440)
RBC: 3.56 MIL/uL — AB (ref 3.80–5.20)
RDW: 15.4 % — AB (ref 11.5–14.5)
WBC: 8.1 10*3/uL (ref 3.6–11.0)

## 2017-03-11 LAB — BASIC METABOLIC PANEL
Anion gap: 12 (ref 5–15)
BUN: 25 mg/dL — AB (ref 6–20)
CALCIUM: 8.7 mg/dL — AB (ref 8.9–10.3)
CO2: 33 mmol/L — ABNORMAL HIGH (ref 22–32)
CREATININE: 6.4 mg/dL — AB (ref 0.44–1.00)
Chloride: 93 mmol/L — ABNORMAL LOW (ref 101–111)
GFR calc non Af Amer: 6 mL/min — ABNORMAL LOW (ref >60)
GFR, EST AFRICAN AMERICAN: 7 mL/min — AB (ref >60)
Glucose, Bld: 226 mg/dL — ABNORMAL HIGH (ref 65–99)
Potassium: 4.7 mmol/L (ref 3.5–5.1)
SODIUM: 138 mmol/L (ref 135–145)

## 2017-03-11 LAB — TROPONIN I
TROPONIN I: 0.03 ng/mL — AB (ref <0.03)
Troponin I: 0.03 ng/mL (ref <0.03)

## 2017-03-11 LAB — APTT: APTT: 45 s — AB (ref 24–36)

## 2017-03-11 MED ORDER — HYDROMORPHONE HCL 1 MG/ML IJ SOLN
1.0000 mg | INTRAMUSCULAR | Status: AC
  Administered 2017-03-11: 1 mg via INTRAVENOUS
  Filled 2017-03-11: qty 1

## 2017-03-11 MED ORDER — IOPAMIDOL (ISOVUE-370) INJECTION 76%
80.0000 mL | Freq: Once | INTRAVENOUS | Status: AC | PRN
  Administered 2017-03-11: 80 mL via INTRAVENOUS

## 2017-03-11 NOTE — ED Notes (Signed)
Patient was stuck 2x by RN for repeat troponin and then asked 2 other nurses to stick her.

## 2017-03-11 NOTE — ED Notes (Signed)
Patient transported to CT 

## 2017-03-11 NOTE — ED Provider Notes (Signed)
[facility] Emergency Department Provider Note  ____________________________________________  Time seen: Approximately 4:30 PM  I have reviewed the triage vital signs and the nursing notes.   HISTORY  Chief Complaint Chest Pain    HPI MARELI ANTUNES is a 61 y.o. female who complains of anterior chest pressure that started during her dialysis treatment this morning suddenly, radiating to the back and left shoulder and associated with a feeling of numbness in the left upper extremity. It's been constant since then, no aggravating or alleviating factors. No relief with nitroglycerin or aspirin. Denies shortness of breath vomiting or diaphoresis. No headache dizziness or vision changes. Has a history of MI, and this feels different.     Past Medical History:  Diagnosis Date  . Anemia of chronic disease   . Carotid arterial disease ([place])    a. 02/2013 U/S: 40-59% bilat ICA stenosis - *f/u 02/2014*  . Chronic constipation   . Chronic diastolic CHF (congestive heart failure) ([place])    a. 10/2013 Echo [facility]): EF 55-60%, mod conc LVH, mod MR, mildly dil LA, mild Ao sclerosis w/o stenosis.  . Colon polyps   . COPD (chronic obstructive pulmonary disease) ([place])   . Coronary artery disease    a. 05/2013 NSTEMI/PCI: LM 20d, LAD min irregs, LCX small, nl, OM1 nl, RCA dom 73m (2.5x16 Promus DES), PDA1 80p.  . Diabetes mellitus   . Diabetic neuropathy ([place])   . Diabetic retinopathy ([place]) 05/28/2013   Hx bilat retinal detachment, proliferative diab retinopathy and bilat vitreous hemorrhage   . Emphysema   . ESRD on hemodialysis ([place])    a. [facility] in [place], [place], on a TTS schedule.  She started dialysis in Feb 2014.  Etiology of renal failure not known, likely diabetes.  Has a left upper arm AV graft.  . History of bronchitis    Mar 2012  . History of pneumonia    June 2012  . History of tobacco abuse    a. Quit 2012.  Marland Kitchen Hyperlipidemia   . Hypertension   . Moderate  mitral insufficiency    a. 10/2013 Echo: EF 55-60%, mod MR.  . Myocardial infarct ([place]) 05/2013  . Peripheral vascular disease ([place])   . Renal insufficiency   . Sickle cell trait [facility])      Patient Active Problem List   Diagnosis Date Noted  . PAD (peripheral artery disease) ([place]) 03/07/2017  . Bilateral carotid artery stenosis 02/17/2017  . Complication of vascular access for dialysis 06/19/2016  . Atypical chest pain 05/21/2016  . Mitral regurgitation 02/06/2016  . Screening for breast cancer 07/27/2015  . Diastolic heart failure ([place]) 05/07/2015  . S/P coronary artery stent placement   . Leg pain 03/05/2015  . Neuritis or radiculitis due to rupture of lumbar intervertebral disc 11/28/2014  . Routine general medical examination at a health care facility 09/02/2013  . Osteoarthritis of right hip 08/23/2013  . DDD (degenerative disc disease), lumbosacral 08/23/2013  . Coronary artery disease of native artery of native heart with stable angina pectoris ([place]) 06/27/2013  . Diabetic retinopathy ([place]) 05/28/2013  . Hyperlipidemia   . Syncope 01/21/2013  . Essential (primary) hypertension 11/08/2012  . Diabetic neuropathy ([place]) 02/18/2012  . Anemia in chronic kidney disease (CKD) 02/18/2012  . ESRD on hemodialysis ([place]) 08/21/2011  . Diabetes mellitus, type II ([place]) 06/30/2011  . COPD (chronic obstructive pulmonary disease) ([place]) 06/13/2011     Past Surgical History:  Procedure Laterality Date  . ABDOMINAL HYSTERECTOMY  2000  . CARDIAC CATHETERIZATION    . CARDIAC CATHETERIZATION N/A 05/02/2015   Procedure: Left Heart Cath and Coronary Angiography;  Surgeon: [name], MD;  Location: [place] CV LAB;  Service: Cardiovascular;  Laterality: N/A;  . COLONOSCOPY WITH PROPOFOL N/A 07/08/2016   Procedure: COLONOSCOPY WITH PROPOFOL;  Surgeon: [name], MD;  Location: [facility] ENDOSCOPY;  Service: Endoscopy;  Laterality: N/A;  . colonscopy    . CORONARY ANGIOPLASTY   05/28/2014   stent placement to the mid RCA  . DILATION AND CURETTAGE OF UTERUS     several in the early 80's  . ESOPHAGOGASTRODUODENOSCOPY     2012  . EYE SURGERY     bilateral laser 2012  . EYE SURGERY     right  . GAS INSERTION  09/30/2011   Procedure: INSERTION OF GAS;  Surgeon: [name], MD;  Location: [place];  Service: Ophthalmology;  Laterality: Right;  [id number]  . GAS/FLUID EXCHANGE  09/30/2011   Procedure: GAS/FLUID EXCHANGE;  Surgeon: [name], MD;  Location: [place];  Service: Ophthalmology;  Laterality: Right;  . LEFT HEART CATHETERIZATION WITH CORONARY ANGIOGRAM N/A 05/28/2013   Procedure: LEFT HEART CATHETERIZATION WITH CORONARY ANGIOGRAM;  Surgeon: [name], MD;  Location: [facility] CATH LAB;  Service: Cardiovascular;  Laterality: N/A;  . PARS PLANA VITRECTOMY  04/22/2011   Procedure: PARS PLANA VITRECTOMY WITH 25 GAUGE;  Surgeon: [name], MD;  Location: [place];  Service: Ophthalmology;  Laterality: Left;  membrane peel, endolaser, gas fluid exchange, silicone oil, repair of complex traction retinal detachment  . PARS PLANA VITRECTOMY  09/30/2011   Procedure: PARS PLANA VITRECTOMY WITH 25 GAUGE;  Surgeon: [name], MD;  Location: [place];  Service: Ophthalmology;  Laterality: Right;  Endolaser; Repair of Complex Traction Retinal Detachment  . PARS PLANA VITRECTOMY  02/24/2012   Procedure: PARS PLANA VITRECTOMY WITH 25 GAUGE;  Surgeon: [name], MD;  Location: [place];  Service: Ophthalmology;  Laterality: Left;  . PTCA    . SILICON OIL REMOVAL  4/43/1540   Procedure: SILICON OIL REMOVAL;  Surgeon: [name], MD;  Location: [place];  Service: Ophthalmology;  Laterality: Left;  . THROMBECTOMY / ARTERIOVENOUS GRAFT REVISION    . TUBAL LIGATION     1979     Prior to Admission medications   Medication Sig Start Date End Date Taking? Authorizing Provider  albuterol (PROVENTIL HFA;VENTOLIN HFA) 108 (90 BASE) MCG/ACT inhaler Inhale 2 puffs into the  lungs every 6 (six) hours as needed for wheezing or shortness of breath.   Yes [provider]  albuterol (PROVENTIL) (2.5 MG/3ML) 0.083% nebulizer solution Take 2.5 mg by nebulization every 4 (four) hours as needed for wheezing or shortness of breath.   Yes [provider]  amLODipine (NORVASC) 10 MG tablet Take 10 mg by mouth daily.  09/17/16  Yes [provider]  aspirin EC 81 MG tablet Take 81 mg by mouth daily.   Yes [provider]  atorvastatin (LIPITOR) 40 MG tablet Take 1 tablet (40 mg total) by mouth daily. Patient taking differently: Take 40 mg by mouth daily at 6 PM.  07/03/16 03/11/18 Yes Hackney, Tina A, FNP  carvedilol (COREG) 12.5 MG tablet IF BLOOD PRESSURE GREATER THAN 160, TAKE 2 TABLETS BY MOUTH TWICE DAILY, OTHERWISE TAKE 1 TABLET BY MOUTH TWICE DAILY. 03/02/17  Yes Darylene Price A, FNP  cinacalcet (SENSIPAR) 30 MG tablet Take 30 mg by mouth daily.  Yes [provider]  cloNIDine (CATAPRES) 0.1 MG tablet Take 0.1 mg by mouth 2 (two) times daily.   Yes [provider]  furosemide (LASIX) 80 MG tablet Take 80 mg by mouth daily.    Yes [provider]  losartan (COZAAR) 50 MG tablet Take 50 mg by mouth daily.  12/16/16  Yes [provider]  Melatonin 10 MG TABS Take 10 mg by mouth daily as needed (SLEEP).    Yes [provider]  multivitamin (RENA-VIT) TABS tablet TAKE ONE TABLET BY MOUTH ONCE DAILY 11/09/16  Yes Darylene Price A, FNP  naproxen sodium (ANAPROX) 220 MG tablet Take 220 mg by mouth daily as needed.   Yes [provider]  nitroGLYCERIN (NITROSTAT) 0.4 MG SL tablet Place 1 tablet (0.4 mg total) under the tongue every 5 (five) minutes as needed for chest pain. 01/15/17  Yes Burnard Hawthorne, FNP  pregabalin (LYRICA) 75 MG capsule Take 1 capsule (75 mg total) by mouth daily. 04/21/16  Yes Burnard Hawthorne, FNP  isosorbide mononitrate (IMDUR) 60 MG 24 hr tablet Take 1 tablet (60 mg total)  by mouth daily. 07/03/16 03/05/17  Alisa Graff, FNP     Allergies Hydrocodone; Metformin; and Lisinopril   Family History  Problem Relation Age of Onset  . Stroke Mother   . Heart attack Mother   . Heart disease Mother   . Colon cancer Father   . Colon cancer Sister   . Heart attack Brother   . Stroke Brother   . Diabetes Brother   . Breast cancer Sister   . Diabetes Brother   . Diabetes Daughter   . Anesthesia problems Neg Hx   . Hypotension Neg Hx   . Malignant hyperthermia Neg Hx   . Pseudochol deficiency Neg Hx     Social History Social History  Substance Use Topics  . Smoking status: Former Smoker    Packs/day: 1.00    Years: 25.00    Quit date: 06/09/2010  . Smokeless tobacco: Never Used  . Alcohol use No    Review of Systems  Constitutional:   No fever or chills.  ENT:   No sore throat. No rhinorrhea. Cardiovascular:   positive as above for chest pain without syncope. Respiratory:   No dyspnea or cough. Gastrointestinal:   Negative for abdominal pain, vomiting and diarrhea.  Musculoskeletal:   Negative for focal pain or swelling All other systems reviewed and are negative except as documented above in ROS and HPI.  ____________________________________________   PHYSICAL EXAM:  VITAL SIGNS: ED Triage Vitals  Enc Vitals Group     BP 03/11/17 1439 (!) 159/54     Pulse Rate 03/11/17 1439 66     Resp 03/11/17 1439 19     Temp 03/11/17 1439 97.9 F (36.6 C)     Temp Source 03/11/17 1439 Oral     SpO2 03/11/17 1439 97 %     Weight 03/11/17 1440 174 lb (78.9 kg)     Height 03/11/17 1440 5' 7.48" (1.714 m)     Head Circumference --      Peak Flow --      Pain Score 03/11/17 1439 6     Pain Loc --      Pain Edu? --      Excl. in [place]? --     Vital signs reviewed, nursing assessments reviewed.   Constitutional:   Alert and oriented. Well appearing and in no distress. Eyes:  No scleral icterus.  EOMI. No nystagmus. No conjunctival pallor.  PERRL. ENT   Head:   Normocephalic and atraumatic.   Nose:   No congestion/rhinnorhea.    Mouth/Throat:   MMM, no pharyngeal erythema. No peritonsillar mass.    Neck:   No meningismus. Full ROM Hematological/Lymphatic/Immunilogical:   No cervical lymphadenopathy. Cardiovascular:   RRR. Symmetric bilateral radial and DP pulses.  No murmurs.  Respiratory:   Normal respiratory effort without tachypnea/retractions. Breath sounds are clear and equal bilaterally. No wheezes/rales/rhonchi. Gastrointestinal:   Soft and nontender. Non distended. There is no CVA tenderness.  No rebound, rigidity, or guarding. Genitourinary:   deferred Musculoskeletal:   Normal range of motion in all extremities. No joint effusions.  No lower extremity tenderness.  No edema. Neurologic:   Normal speech and language.  Motor grossly intact. No gross focal neurologic deficits are appreciated.  Skin:    Skin is warm, dry and intact. No rash noted.  No petechiae, purpura, or bullae.  ____________________________________________    LABS (pertinent positives/negatives) (all labs ordered are listed, but only abnormal results are displayed) Labs Reviewed  BASIC METABOLIC PANEL - Abnormal; Notable for the following:       Result Value   Chloride 93 (*)    CO2 33 (*)    Glucose, Bld 226 (*)    BUN 25 (*)    Creatinine, Ser 6.40 (*)    Calcium 8.7 (*)    GFR calc non Af Amer 6 (*)    GFR calc Af Amer 7 (*)    All other components within normal limits  CBC - Abnormal; Notable for the following:    RBC 3.56 (*)    MCV 103.6 (*)    MCH 35.1 (*)    RDW 15.4 (*)    Platelets 145 (*)    All other components within normal limits  TROPONIN I - Abnormal; Notable for the following:    Troponin I 0.03 (*)    All other components within normal limits  APTT - Abnormal; Notable for the following:    aPTT 45 (*)    All other components within normal limits  TROPONIN I - Abnormal; Notable for the following:     Troponin I 0.03 (*)    All other components within normal limits  PROTIME-INR   ____________________________________________   EKG  interpreted by me Sinus rhythm rate of 66, normal axis and intervals. Left ventricular hypertrophy, no acute ischemic changes. Q waves present in the inferior leads.  ____________________________________________    [id number]  Dg Chest 2 View  Result Date: 03/11/2017 CLINICAL DATA:  Chest pain. EXAM: CHEST  2 VIEW COMPARISON:  Chest x-ray dated May 21, 2016. FINDINGS: The cardiomediastinal silhouette is normal in size. Normal pulmonary vascularity. No focal consolidation, pleural effusion, or pneumothorax. No acute osseous abnormality. IMPRESSION: No active cardiopulmonary disease. Electronically Signed   By: Titus Dubin M.D.   On: 03/11/2017 15:33   Ct Angio Chest Aorta W And/or Wo Contrast  Result Date: 03/11/2017 CLINICAL DATA:  Chest pain radiating to the shoulder blades. EXAM: CT ANGIOGRAPHY CHEST WITH CONTRAST TECHNIQUE: Multidetector CT imaging of the chest was performed using the standard protocol during bolus administration of intravenous contrast. Multiplanar CT image reconstructions and MIPs were obtained to evaluate the vascular anatomy. CONTRAST:  80 mL Isovue 370 COMPARISON:  Chest radiographs 03/11/2017 FINDINGS: Cardiovascular: Preferential opacification of the thoracic aorta. No evidence of thoracic aortic aneurysm or dissection. Normal heart size. No pericardial effusion. There  is atherosclerotic calcification of the aortic arch extending into the proximal arch vessels. There is coronary artery atherosclerotic calcification. Normal pulmonary arteries. Mediastinum/Nodes: No enlarged mediastinal, hilar, or axillary lymph nodes. Thyroid gland, trachea, and esophagus demonstrate no significant findings. Lungs/Pleura: Lungs are clear. No pleural effusion or pneumothorax. Upper Abdomen: No acute abnormality. Musculoskeletal: No chest wall  abnormality. No acute or significant osseous findings. Review of the MIP images confirms the above findings. IMPRESSION: 1. No acute aortic syndrome. 2. Coronary artery and Aortic Atherosclerosis (ICD10-I70.0). Electronically Signed   By: Ulyses Jarred M.D.   On: 03/11/2017 16:56    ____________________________________________   PROCEDURES Procedures  ____________________________________________   INITIAL IMPRESSION / ASSESSMENT AND PLAN / ED COURSE  Pertinent labs & imaging results that were available during my care of the patient were reviewed by me and considered in my medical decision making (see chart for details).  patient presents with sudden onset of chest pain with extensive radiation, onset during dialysis. She does report a decrease of her blood pressure slightly to 657 systolic during the onset. Possibly demand ischemia/transient dehydration from dialysis, aortic dissection, ACS. She is well appearing and not in distress, unremarkable vitals except for mild hypertension. Initial labs unremarkable, chest x-ray unremarkable. We'll get a CT angiogram of the chest to evaluate the aorta, check a delta troponin. If workup negative, patient will be suitable for outpatient follow-up. She requests to be discharged later today if her workup is reassuring.  Clinical Course as of Mar 11 2136  Wed Mar 11, 2017  1537 MCHC: 33.9 [PS]    Clinical Course User Index [PS] Carrie Mew, MD     ----------------------------------------- 9:37 PM on 03/11/2017 -----------------------------------------  Workup negative. Vitals stable. Symptoms improved. Delta troponin negative, CT angiogram unremarkable, no evidence of ACS PE dissection pneumothorax carditis or pericardial effusion.electrolytes okay. BUN unremarkable. Follow-up primary care. At this point this seems to be an episode of dialysis disequilibrium syndrome. ____________________________________________   FINAL CLINICAL  IMPRESSION(S) / ED DIAGNOSES  Final diagnoses:  Nonspecific chest pain      New Prescriptions   No medications on file     Portions of this note were generated with dragon dictation software. Dictation errors may occur despite best attempts at proofreading.    Carrie Mew, MD 03/11/17 2138

## 2017-03-11 NOTE — Progress Notes (Signed)
Pt deaccessed AVF. Alert, no c/o, stable.

## 2017-03-11 NOTE — ED Notes (Signed)
Hooked patient up to monitor. 

## 2017-03-11 NOTE — ED Triage Notes (Addendum)
Patient was currently receiving dialysis trx and stopped an hour before done due Center chest pain radiating to shoulder blades and down L arm. HX CAD, MI, cardiac stent. EMS gave 324 aspirin. EMS vitals 110/63, 98% RA, 60s HR. Patient was given 3 nitro with no relief at dialysis. Patients access to dialysis still in place upon arrival. A&O x4

## 2017-03-11 NOTE — Discharge Instructions (Signed)
Your blood tests and CT scan of your chest were unremarkable today.

## 2017-03-11 NOTE — ED Notes (Signed)
Dialysis called to de-access patient

## 2017-03-13 DIAGNOSIS — D631 Anemia in chronic kidney disease: Secondary | ICD-10-CM | POA: Diagnosis not present

## 2017-03-13 DIAGNOSIS — N186 End stage renal disease: Secondary | ICD-10-CM | POA: Diagnosis not present

## 2017-03-13 DIAGNOSIS — D509 Iron deficiency anemia, unspecified: Secondary | ICD-10-CM | POA: Diagnosis not present

## 2017-03-13 DIAGNOSIS — N2581 Secondary hyperparathyroidism of renal origin: Secondary | ICD-10-CM | POA: Diagnosis not present

## 2017-03-13 DIAGNOSIS — Z992 Dependence on renal dialysis: Secondary | ICD-10-CM | POA: Diagnosis not present

## 2017-03-16 DIAGNOSIS — D509 Iron deficiency anemia, unspecified: Secondary | ICD-10-CM | POA: Diagnosis not present

## 2017-03-16 DIAGNOSIS — N186 End stage renal disease: Secondary | ICD-10-CM | POA: Diagnosis not present

## 2017-03-16 DIAGNOSIS — E119 Type 2 diabetes mellitus without complications: Secondary | ICD-10-CM | POA: Diagnosis not present

## 2017-03-16 DIAGNOSIS — N2581 Secondary hyperparathyroidism of renal origin: Secondary | ICD-10-CM | POA: Diagnosis not present

## 2017-03-16 DIAGNOSIS — Z992 Dependence on renal dialysis: Secondary | ICD-10-CM | POA: Diagnosis not present

## 2017-03-16 DIAGNOSIS — D631 Anemia in chronic kidney disease: Secondary | ICD-10-CM | POA: Diagnosis not present

## 2017-03-17 ENCOUNTER — Encounter: Payer: Self-pay | Admitting: Family

## 2017-03-17 ENCOUNTER — Ambulatory Visit (INDEPENDENT_AMBULATORY_CARE_PROVIDER_SITE_OTHER): Payer: Medicare Other | Admitting: Family

## 2017-03-17 DIAGNOSIS — I6523 Occlusion and stenosis of bilateral carotid arteries: Secondary | ICD-10-CM

## 2017-03-17 DIAGNOSIS — Z794 Long term (current) use of insulin: Secondary | ICD-10-CM

## 2017-03-17 DIAGNOSIS — I10 Essential (primary) hypertension: Secondary | ICD-10-CM

## 2017-03-17 DIAGNOSIS — N186 End stage renal disease: Secondary | ICD-10-CM | POA: Diagnosis not present

## 2017-03-17 DIAGNOSIS — Z992 Dependence on renal dialysis: Secondary | ICD-10-CM | POA: Diagnosis not present

## 2017-03-17 DIAGNOSIS — E1122 Type 2 diabetes mellitus with diabetic chronic kidney disease: Secondary | ICD-10-CM | POA: Diagnosis not present

## 2017-03-17 NOTE — Progress Notes (Signed)
Subjective:    Patient ID: Theresa [name], female    DOB: 11/17/1955, 61 y.o.   MRN: [id number]  CC: Theresa [name] is a 61 y.o. female who presents today for follow up.   HPI: No further episodes of chest pain.   Doing well today.  Chronic low back pain- Sees Dr Sharlet Salina in 2 weeks.  DM- last a1c 5.7 two months ago.   HTN- compliant with medications. Last use of nitro was day went to ED; prior to that episode hasnt used this year.  Denies exertional chest pain or pressure, numbness or tingling radiating to left arm or jaw, palpitations, dizziness, frequent headaches, changes in vision, or shortness of breath.      ED 10/3 for CP and left arm numbness. Started during diaylsis.  CXR no acute findings CT angio- no acute aortic syndrome. Aortic atherosclerosis.  Suspected demand ischemia/ transient dehydration from dialysis. Troponin 0.3 x 2.   [place]- 02/2017    MRI 01/2017- DDD L5 -S1.  HISTORY:  Past Medical History:  Diagnosis Date  . Anemia of chronic disease   . Carotid arterial disease ([place])    a. 02/2013 U/S: 40-59% bilat ICA stenosis - *f/u 02/2014*  . Chronic constipation   . Chronic diastolic CHF (congestive heart failure) ([place])    a. 10/2013 Echo [facility]): EF 55-60%, mod conc LVH, mod MR, mildly dil LA, mild Ao sclerosis w/o stenosis.  . Colon polyps   . COPD (chronic obstructive pulmonary disease) ([place])   . Coronary artery disease    a. 05/2013 NSTEMI/PCI: LM 20d, LAD min irregs, LCX small, nl, OM1 nl, RCA dom 61m (2.5x16 Promus DES), PDA1 80p.  . Diabetes mellitus   . Diabetic neuropathy ([place])   . Diabetic retinopathy ([place]) 05/28/2013   Hx bilat retinal detachment, proliferative diab retinopathy and bilat vitreous hemorrhage   . Emphysema   . ESRD on hemodialysis ([place])    a. [facility] in [place], [place], on a TTS schedule.  She started dialysis in Feb 2014.  Etiology of renal failure not known, likely diabetes.  Has a left upper arm AV graft.  . History of bronchitis      Mar 2012  . History of pneumonia    June 2012  . History of tobacco abuse    a. Quit 2012.  Marland Kitchen Hyperlipidemia   . Hypertension   . Moderate mitral insufficiency    a. 10/2013 Echo: EF 55-60%, mod MR.  . Myocardial infarct ([place]) 05/2013  . Peripheral vascular disease ([place])   . Renal insufficiency   . Sickle cell trait [facility])    Past Surgical History:  Procedure Laterality Date  . ABDOMINAL HYSTERECTOMY     2000  . CARDIAC CATHETERIZATION    . CARDIAC CATHETERIZATION N/A 05/02/2015   Procedure: Left Heart Cath and Coronary Angiography;  Surgeon: [name], MD;  Location: [place] CV LAB;  Service: Cardiovascular;  Laterality: N/A;  . COLONOSCOPY WITH PROPOFOL N/A 07/08/2016   Procedure: COLONOSCOPY WITH PROPOFOL;  Surgeon: [name], MD;  Location: [facility] ENDOSCOPY;  Service: Endoscopy;  Laterality: N/A;  . colonscopy    . CORONARY ANGIOPLASTY  05/28/2014   stent placement to the mid RCA  . DILATION AND CURETTAGE OF UTERUS     several in the early 80's  . ESOPHAGOGASTRODUODENOSCOPY     2012  . EYE SURGERY     bilateral laser 2012  . EYE SURGERY     right  . GAS INSERTION  09/30/2011   Procedure: INSERTION OF GAS;  Surgeon: [name], MD;  Location: [place];  Service: Ophthalmology;  Laterality: Right;  C3F8  . GAS/FLUID EXCHANGE  09/30/2011   Procedure: GAS/FLUID EXCHANGE;  Surgeon: [name], MD;  Location: [place];  Service: Ophthalmology;  Laterality: Right;  . LEFT HEART CATHETERIZATION WITH CORONARY ANGIOGRAM N/A 05/28/2013   Procedure: LEFT HEART CATHETERIZATION WITH CORONARY ANGIOGRAM;  Surgeon: [name], MD;  Location: [facility];  Service: Cardiovascular;  Laterality: N/A;  . PARS PLANA VITRECTOMY  04/22/2011   Procedure: PARS PLANA VITRECTOMY WITH 25 GAUGE;  Surgeon: [name], MD;  Location: [place];  Service: Ophthalmology;  Laterality: Left;  membrane peel, endolaser, gas fluid exchange, silicone oil, repair of complex traction  retinal detachment  . PARS PLANA VITRECTOMY  09/30/2011   Procedure: PARS PLANA VITRECTOMY WITH 25 GAUGE;  Surgeon: [name], MD;  Location: [place];  Service: Ophthalmology;  Laterality: Right;  Endolaser; Repair of Complex Traction Retinal Detachment  . PARS PLANA VITRECTOMY  02/24/2012   Procedure: PARS PLANA VITRECTOMY WITH 25 GAUGE;  Surgeon: [name], MD;  Location: [place];  Service: Ophthalmology;  Laterality: Left;  . PTCA    . SILICON OIL REMOVAL  1/61/0960   Procedure: SILICON OIL REMOVAL;  Surgeon: [name], MD;  Location: [place];  Service: Ophthalmology;  Laterality: Left;  . THROMBECTOMY / ARTERIOVENOUS GRAFT REVISION    . TUBAL LIGATION     1979   Family History  Problem Relation Age of Onset  . Stroke Mother   . Heart attack Mother   . Heart disease Mother   . Colon cancer Father   . Colon cancer Sister   . Heart attack Brother   . Stroke Brother   . Diabetes Brother   . Breast cancer Sister   . Diabetes Brother   . Diabetes Daughter   . Anesthesia problems Neg Hx   . Hypotension Neg Hx   . Malignant hyperthermia Neg Hx   . Pseudochol deficiency Neg Hx     Allergies: Hydrocodone; Metformin; and Lisinopril Current Outpatient Prescriptions on File Prior to Visit  Medication Sig Dispense Refill  . albuterol (PROVENTIL HFA;VENTOLIN HFA) 108 (90 BASE) MCG/ACT inhaler Inhale 2 puffs into the lungs every 6 (six) hours as needed for wheezing or shortness of breath.    Marland Kitchen albuterol (PROVENTIL) (2.5 MG/3ML) 0.083% nebulizer solution Take 2.5 mg by nebulization every 4 (four) hours as needed for wheezing or shortness of breath.    Marland Kitchen amLODipine (NORVASC) 10 MG tablet Take 10 mg by mouth daily.     Marland Kitchen aspirin EC 81 MG tablet Take 81 mg by mouth daily.    Marland Kitchen atorvastatin (LIPITOR) 40 MG tablet Take 1 tablet (40 mg total) by mouth daily. (Patient taking differently: Take 40 mg by mouth daily at 6 PM. ) 90 tablet 3  . carvedilol (COREG) 12.5 MG tablet IF BLOOD  PRESSURE GREATER THAN 160, TAKE 2 TABLETS BY MOUTH TWICE DAILY, OTHERWISE TAKE 1 TABLET BY MOUTH TWICE DAILY. 70 tablet 6  . cinacalcet (SENSIPAR) 30 MG tablet Take 30 mg by mouth daily.    . cloNIDine (CATAPRES) 0.1 MG tablet Take 0.1 mg by mouth 2 (two) times daily.    . furosemide (LASIX) 80 MG tablet Take 80 mg by mouth daily.     Marland Kitchen losartan (COZAAR) 50 MG tablet Take 50 mg by mouth daily.     Marland Kitchen  Melatonin 10 MG TABS Take 10 mg by mouth daily as needed (SLEEP).     . multivitamin (RENA-VIT) TABS tablet TAKE ONE TABLET BY MOUTH ONCE DAILY 30 tablet 5  . naproxen sodium (ANAPROX) 220 MG tablet Take 220 mg by mouth daily as needed.    . nitroGLYCERIN (NITROSTAT) 0.4 MG SL tablet Place 1 tablet (0.4 mg total) under the tongue every 5 (five) minutes as needed for chest pain. 25 tablet 3  . pregabalin (LYRICA) 75 MG capsule Take 1 capsule (75 mg total) by mouth daily. 90 capsule 1  . isosorbide mononitrate (IMDUR) 60 MG 24 hr tablet Take 1 tablet (60 mg total) by mouth daily. 90 tablet 3   Current Facility-Administered Medications on File Prior to Visit  Medication Dose Route Frequency Provider Last Rate Last Dose  . betamethasone acetate-betamethasone sodium phosphate (CELESTONE) injection 3 mg  3 mg Intramuscular Once Edrick Kins, DPM        Social History  Substance Use Topics  . Smoking status: Former Smoker    Packs/day: 1.00    Years: 25.00    Quit date: 06/09/2010  . Smokeless tobacco: Never Used  . Alcohol use No    Review of Systems  Constitutional: Negative for chills and fever.  Respiratory: Negative for cough.   Cardiovascular: Negative for chest pain and palpitations.  Gastrointestinal: Negative for nausea and vomiting.      Objective:    BP (!) 138/56   Pulse 78   Temp 98.2 F (36.8 C) (Oral)   Ht 5\' 7"  (1.702 m)   Wt 179 lb 12.8 oz (81.6 kg)   SpO2 95%   BMI 28.16 kg/m  BP Readings from Last 3 Encounters:  03/17/17 (!) 138/56  03/11/17 94/68  03/05/17 (!)  179/81   Wt Readings from Last 3 Encounters:  03/17/17 179 lb 12.8 oz (81.6 kg)  03/11/17 174 lb (78.9 kg)  03/05/17 174 lb (78.9 kg)    Physical Exam  Constitutional: She appears well-developed and well-nourished.  Eyes: Conjunctivae are normal.  Cardiovascular: Normal rate, regular rhythm, normal heart sounds and normal pulses.   Pulmonary/Chest: Effort normal and breath sounds normal. She has no wheezes. She has no rhonchi. She has no rales.  Neurological: She is alert.  Skin: Skin is warm and dry.  Psychiatric: She has a normal mood and affect. Her speech is normal and behavior is normal. Thought content normal.  Vitals reviewed.      Assessment & Plan:   Problem List Items Addressed This Visit      Cardiovascular and Mediastinum   Essential (primary) hypertension (Chronic)    At goal. Dialysis days Monday Wednesday Friday. No recurrence of chest pain and reviewed ED visit with patient today.Will continue to follow.        Endocrine   Diabetes mellitus, type II ([place]) (Chronic)    Diet controlled. Will follow.           I am having Ms. Dejager maintain her aspirin EC, albuterol, albuterol, furosemide, Melatonin, pregabalin, isosorbide mononitrate, atorvastatin, cloNIDine, naproxen sodium, amLODipine, multivitamin, losartan, nitroGLYCERIN, carvedilol, and cinacalcet. We will continue to administer betamethasone acetate-betamethasone sodium phosphate.   No orders of the defined types were placed in this encounter.   Return precautions given.   Risks, benefits, and alternatives of the medications and treatment plan prescribed today were discussed, and patient expressed understanding.   Education regarding symptom management and diagnosis given to patient on AVS.  Continue to follow with  Burnard Hawthorne, FNP for routine health maintenance.   Theresa [name] and I agreed with plan.   Mable Paris, FNP

## 2017-03-17 NOTE — Assessment & Plan Note (Signed)
Diet controlled. Will follow.

## 2017-03-17 NOTE — Assessment & Plan Note (Signed)
At goal. Dialysis days Monday Wednesday Friday. No recurrence of chest pain and reviewed ED visit with patient today.Will continue to follow.

## 2017-03-17 NOTE — Patient Instructions (Signed)
Pleasure seeing you   

## 2017-03-17 NOTE — Progress Notes (Signed)
Pre visit review using our clinic review tool, if applicable. No additional management support is needed unless otherwise documented below in the visit note. 

## 2017-03-18 DIAGNOSIS — D509 Iron deficiency anemia, unspecified: Secondary | ICD-10-CM | POA: Diagnosis not present

## 2017-03-18 DIAGNOSIS — N2581 Secondary hyperparathyroidism of renal origin: Secondary | ICD-10-CM | POA: Diagnosis not present

## 2017-03-18 DIAGNOSIS — N186 End stage renal disease: Secondary | ICD-10-CM | POA: Diagnosis not present

## 2017-03-18 DIAGNOSIS — D631 Anemia in chronic kidney disease: Secondary | ICD-10-CM | POA: Diagnosis not present

## 2017-03-18 DIAGNOSIS — Z992 Dependence on renal dialysis: Secondary | ICD-10-CM | POA: Diagnosis not present

## 2017-03-20 DIAGNOSIS — D631 Anemia in chronic kidney disease: Secondary | ICD-10-CM | POA: Diagnosis not present

## 2017-03-20 DIAGNOSIS — Z992 Dependence on renal dialysis: Secondary | ICD-10-CM | POA: Diagnosis not present

## 2017-03-20 DIAGNOSIS — D509 Iron deficiency anemia, unspecified: Secondary | ICD-10-CM | POA: Diagnosis not present

## 2017-03-20 DIAGNOSIS — N186 End stage renal disease: Secondary | ICD-10-CM | POA: Diagnosis not present

## 2017-03-20 DIAGNOSIS — N2581 Secondary hyperparathyroidism of renal origin: Secondary | ICD-10-CM | POA: Diagnosis not present

## 2017-03-23 DIAGNOSIS — D509 Iron deficiency anemia, unspecified: Secondary | ICD-10-CM | POA: Diagnosis not present

## 2017-03-23 DIAGNOSIS — N186 End stage renal disease: Secondary | ICD-10-CM | POA: Diagnosis not present

## 2017-03-23 DIAGNOSIS — D631 Anemia in chronic kidney disease: Secondary | ICD-10-CM | POA: Diagnosis not present

## 2017-03-23 DIAGNOSIS — Z992 Dependence on renal dialysis: Secondary | ICD-10-CM | POA: Diagnosis not present

## 2017-03-23 DIAGNOSIS — N2581 Secondary hyperparathyroidism of renal origin: Secondary | ICD-10-CM | POA: Diagnosis not present

## 2017-03-25 DIAGNOSIS — N2581 Secondary hyperparathyroidism of renal origin: Secondary | ICD-10-CM | POA: Diagnosis not present

## 2017-03-25 DIAGNOSIS — D509 Iron deficiency anemia, unspecified: Secondary | ICD-10-CM | POA: Diagnosis not present

## 2017-03-25 DIAGNOSIS — D631 Anemia in chronic kidney disease: Secondary | ICD-10-CM | POA: Diagnosis not present

## 2017-03-25 DIAGNOSIS — N186 End stage renal disease: Secondary | ICD-10-CM | POA: Diagnosis not present

## 2017-03-25 DIAGNOSIS — Z992 Dependence on renal dialysis: Secondary | ICD-10-CM | POA: Diagnosis not present

## 2017-03-27 DIAGNOSIS — D509 Iron deficiency anemia, unspecified: Secondary | ICD-10-CM | POA: Diagnosis not present

## 2017-03-27 DIAGNOSIS — Z992 Dependence on renal dialysis: Secondary | ICD-10-CM | POA: Diagnosis not present

## 2017-03-27 DIAGNOSIS — N186 End stage renal disease: Secondary | ICD-10-CM | POA: Diagnosis not present

## 2017-03-27 DIAGNOSIS — D631 Anemia in chronic kidney disease: Secondary | ICD-10-CM | POA: Diagnosis not present

## 2017-03-27 DIAGNOSIS — N2581 Secondary hyperparathyroidism of renal origin: Secondary | ICD-10-CM | POA: Diagnosis not present

## 2017-03-30 DIAGNOSIS — Z992 Dependence on renal dialysis: Secondary | ICD-10-CM | POA: Diagnosis not present

## 2017-03-30 DIAGNOSIS — D509 Iron deficiency anemia, unspecified: Secondary | ICD-10-CM | POA: Diagnosis not present

## 2017-03-30 DIAGNOSIS — N2581 Secondary hyperparathyroidism of renal origin: Secondary | ICD-10-CM | POA: Diagnosis not present

## 2017-03-30 DIAGNOSIS — N186 End stage renal disease: Secondary | ICD-10-CM | POA: Diagnosis not present

## 2017-03-30 DIAGNOSIS — D631 Anemia in chronic kidney disease: Secondary | ICD-10-CM | POA: Diagnosis not present

## 2017-03-31 DIAGNOSIS — M5136 Other intervertebral disc degeneration, lumbar region: Secondary | ICD-10-CM | POA: Diagnosis not present

## 2017-03-31 DIAGNOSIS — M5416 Radiculopathy, lumbar region: Secondary | ICD-10-CM | POA: Diagnosis not present

## 2017-04-01 DIAGNOSIS — Z992 Dependence on renal dialysis: Secondary | ICD-10-CM | POA: Diagnosis not present

## 2017-04-01 DIAGNOSIS — D509 Iron deficiency anemia, unspecified: Secondary | ICD-10-CM | POA: Diagnosis not present

## 2017-04-01 DIAGNOSIS — N186 End stage renal disease: Secondary | ICD-10-CM | POA: Diagnosis not present

## 2017-04-01 DIAGNOSIS — N2581 Secondary hyperparathyroidism of renal origin: Secondary | ICD-10-CM | POA: Diagnosis not present

## 2017-04-01 DIAGNOSIS — D631 Anemia in chronic kidney disease: Secondary | ICD-10-CM | POA: Diagnosis not present

## 2017-04-02 ENCOUNTER — Encounter: Payer: Self-pay | Admitting: Podiatry

## 2017-04-02 ENCOUNTER — Ambulatory Visit (INDEPENDENT_AMBULATORY_CARE_PROVIDER_SITE_OTHER): Payer: Medicare Other | Admitting: Podiatry

## 2017-04-02 DIAGNOSIS — M79609 Pain in unspecified limb: Secondary | ICD-10-CM

## 2017-04-02 DIAGNOSIS — B351 Tinea unguium: Secondary | ICD-10-CM | POA: Diagnosis not present

## 2017-04-02 DIAGNOSIS — E1140 Type 2 diabetes mellitus with diabetic neuropathy, unspecified: Secondary | ICD-10-CM

## 2017-04-02 NOTE — Progress Notes (Signed)
Complaint:  Visit Type: Patient returns to my office for continued preventative foot care services. Complaint: Patient states" my nails have grown long and thick and become painful to walk and wear shoes" Patient has been diagnosed with DM with no foot complications. The patient presents for preventative foot care services. No changes to ROS  Podiatric Exam: Vascular: dorsalis pedis and posterior tibial pulses are palpable bilateral. Capillary return is immediate. Temperature gradient is WNL. Skin turgor WNL  Sensorium: Normal Semmes Weinstein monofilament test. Normal tactile sensation bilaterally. Nail Exam: Pt has thick disfigured discolored nails with subungual debris noted bilateral entire nail hallux through fifth toenails.  Pincer nails  B/L Ulcer Exam: There is no evidence of ulcer or pre-ulcerative changes or infection. Orthopedic Exam: Muscle tone and strength are WNL. No limitations in general ROM. No crepitus or effusions noted. Foot type and digits show no abnormalities. Bony prominences are unremarkable. Skin: No Porokeratosis. No infection or ulcers  Diagnosis:  Onychomycosis, , Pain in right toe, pain in left toes  Treatment & Plan Procedures and Treatment: Consent by patient was obtained for treatment procedures. The patient understood the discussion of treatment and procedures well. All questions were answered thoroughly reviewed. Debridement of mycotic and hypertrophic toenails, 1 through 5 bilateral and clearing of subungual debris. No ulceration, no infection noted. ABN signed for 2018. Return Visit-Office Procedure: Patient instructed to return to the office for a follow up visit 3 months for continued evaluation and treatment.    Gardiner Barefoot DPM

## 2017-04-03 DIAGNOSIS — Z992 Dependence on renal dialysis: Secondary | ICD-10-CM | POA: Diagnosis not present

## 2017-04-03 DIAGNOSIS — D509 Iron deficiency anemia, unspecified: Secondary | ICD-10-CM | POA: Diagnosis not present

## 2017-04-03 DIAGNOSIS — N186 End stage renal disease: Secondary | ICD-10-CM | POA: Diagnosis not present

## 2017-04-03 DIAGNOSIS — D631 Anemia in chronic kidney disease: Secondary | ICD-10-CM | POA: Diagnosis not present

## 2017-04-03 DIAGNOSIS — N2581 Secondary hyperparathyroidism of renal origin: Secondary | ICD-10-CM | POA: Diagnosis not present

## 2017-04-06 ENCOUNTER — Ambulatory Visit
Admission: RE | Admit: 2017-04-06 | Discharge: 2017-04-06 | Disposition: A | Discharge status: Home / Self Care | Payer: MEDICARE | Attending: Nephrology | Admitting: Nephrology

## 2017-04-06 DIAGNOSIS — Z992 Dependence on renal dialysis: Secondary | ICD-10-CM | POA: Diagnosis not present

## 2017-04-06 DIAGNOSIS — N186 End stage renal disease: Secondary | ICD-10-CM | POA: Diagnosis not present

## 2017-04-06 DIAGNOSIS — D631 Anemia in chronic kidney disease: Secondary | ICD-10-CM | POA: Diagnosis not present

## 2017-04-06 DIAGNOSIS — D509 Iron deficiency anemia, unspecified: Secondary | ICD-10-CM | POA: Diagnosis not present

## 2017-04-06 DIAGNOSIS — N2581 Secondary hyperparathyroidism of renal origin: Secondary | ICD-10-CM | POA: Diagnosis not present

## 2017-04-08 DIAGNOSIS — N186 End stage renal disease: Secondary | ICD-10-CM | POA: Diagnosis not present

## 2017-04-08 DIAGNOSIS — D509 Iron deficiency anemia, unspecified: Secondary | ICD-10-CM | POA: Diagnosis not present

## 2017-04-08 DIAGNOSIS — D631 Anemia in chronic kidney disease: Secondary | ICD-10-CM | POA: Diagnosis not present

## 2017-04-08 DIAGNOSIS — N2581 Secondary hyperparathyroidism of renal origin: Secondary | ICD-10-CM | POA: Diagnosis not present

## 2017-04-08 DIAGNOSIS — Z992 Dependence on renal dialysis: Secondary | ICD-10-CM | POA: Diagnosis not present

## 2017-04-09 ENCOUNTER — Ambulatory Visit (INDEPENDENT_AMBULATORY_CARE_PROVIDER_SITE_OTHER): Payer: Medicare Other | Admitting: Vascular Surgery

## 2017-04-09 ENCOUNTER — Ambulatory Visit (INDEPENDENT_AMBULATORY_CARE_PROVIDER_SITE_OTHER): Payer: Medicare Other

## 2017-04-09 ENCOUNTER — Encounter (INDEPENDENT_AMBULATORY_CARE_PROVIDER_SITE_OTHER): Payer: Self-pay | Admitting: Vascular Surgery

## 2017-04-09 ENCOUNTER — Encounter (INDEPENDENT_AMBULATORY_CARE_PROVIDER_SITE_OTHER): Payer: Self-pay

## 2017-04-09 VITALS — BP 136/62 | HR 69 | Resp 16 | Ht 67.5 in | Wt 174.6 lb

## 2017-04-09 DIAGNOSIS — E1122 Type 2 diabetes mellitus with diabetic chronic kidney disease: Secondary | ICD-10-CM | POA: Diagnosis not present

## 2017-04-09 DIAGNOSIS — D509 Iron deficiency anemia, unspecified: Secondary | ICD-10-CM | POA: Diagnosis not present

## 2017-04-09 DIAGNOSIS — I6523 Occlusion and stenosis of bilateral carotid arteries: Secondary | ICD-10-CM | POA: Diagnosis not present

## 2017-04-09 DIAGNOSIS — M79604 Pain in right leg: Secondary | ICD-10-CM | POA: Diagnosis not present

## 2017-04-09 DIAGNOSIS — N2581 Secondary hyperparathyroidism of renal origin: Secondary | ICD-10-CM | POA: Diagnosis not present

## 2017-04-09 DIAGNOSIS — E782 Mixed hyperlipidemia: Secondary | ICD-10-CM

## 2017-04-09 DIAGNOSIS — Z794 Long term (current) use of insulin: Secondary | ICD-10-CM | POA: Diagnosis not present

## 2017-04-09 DIAGNOSIS — M79605 Pain in left leg: Secondary | ICD-10-CM | POA: Diagnosis not present

## 2017-04-09 DIAGNOSIS — I739 Peripheral vascular disease, unspecified: Secondary | ICD-10-CM | POA: Diagnosis not present

## 2017-04-09 DIAGNOSIS — D631 Anemia in chronic kidney disease: Secondary | ICD-10-CM | POA: Diagnosis not present

## 2017-04-09 DIAGNOSIS — Z992 Dependence on renal dialysis: Secondary | ICD-10-CM | POA: Diagnosis not present

## 2017-04-09 DIAGNOSIS — N186 End stage renal disease: Secondary | ICD-10-CM | POA: Diagnosis not present

## 2017-04-09 DIAGNOSIS — M5137 Other intervertebral disc degeneration, lumbosacral region: Secondary | ICD-10-CM | POA: Diagnosis not present

## 2017-04-09 NOTE — Progress Notes (Signed)
Subjective:    Patient ID: Theresa [name], female    DOB: [birth year], 61 y.o.   MRN: [id number] Chief Complaint  Patient presents with  . Follow-up    pt conv abi,aorta iliac   Patient presents to review vascular studies. Patient last seen on 03/05/2017 for evaluation of right lower extremity pain. Her pain is stable. Pain with and without activity. Denies rest pain or ulceration to the lower extremity. The patient underwent a bilateral ABI which was notable for noncompressible vessels due to medial calcification however her bilateral femoral and tibial arteries are triphasic. ABI with normal bilateral Doppler waveforms and toe brachial indices suggesting normal arterial perfusion to the bilateral lower extremity. Denies any fever, nausea vomiting.   Review of Systems  Constitutional: Negative.   HENT: Negative.   Eyes: Negative.   Respiratory: Negative.   Cardiovascular:       Right lower extremity pain  Gastrointestinal: Negative.   Endocrine: Negative.   Genitourinary: Negative.   Musculoskeletal: Negative.   Skin: Negative.   Allergic/Immunologic: Negative.   Neurological: Negative.   Hematological: Negative.   Psychiatric/Behavioral: Negative.       Objective:   Physical Exam  Constitutional: She is oriented to person, place, and time. She appears well-developed. No distress.  HENT:  Head: Normocephalic and atraumatic.  Eyes: Pupils are equal, round, and reactive to light.  Neck: Normal range of motion.  Cardiovascular: Normal rate, regular rhythm, normal heart sounds and intact distal pulses.   Pulses:      Radial pulses are 2+ on the right side, and 2+ on the left side.       Dorsalis pedis pulses are 2+ on the right side, and 2+ on the left side.       Posterior tibial pulses are 2+ on the right side, and 2+ on the left side.  Pulmonary/Chest: Effort normal.  Musculoskeletal: Normal range of motion. She exhibits no edema.  Neurological: She is alert and oriented to  person, place, and time.  Skin: Skin is warm and dry. She is not diaphoretic.  Psychiatric: She has a normal mood and affect. Her behavior is normal. Judgment and thought content normal.  Vitals reviewed.  BP 136/62 (BP Location: Right Arm)   Pulse 69   Resp 16   Ht 5' 7.5" (1.715 m)   Wt 174 lb 9.6 oz (79.2 kg)   BMI 26.94 kg/m   Past Medical History:  Diagnosis Date  . Anemia of chronic disease   . Carotid arterial disease ([place])    a. 02/2013 U/S: 40-59% bilat ICA stenosis - *f/u 02/2014*  . Chronic constipation   . Chronic diastolic CHF (congestive heart failure) ([place])    a. 10/2013 Echo [facility]): EF 55-60%, mod conc LVH, mod MR, mildly dil LA, mild Ao sclerosis w/o stenosis.  . Colon polyps   . COPD (chronic obstructive pulmonary disease) ([place])   . Coronary artery disease    a. 05/2013 NSTEMI/PCI: LM 20d, LAD min irregs, LCX small, nl, OM1 nl, RCA dom 19m (2.5x16 Promus DES), PDA1 80p.  . Diabetes mellitus   . Diabetic neuropathy ([place])   . Diabetic retinopathy ([place]) 05/28/2013   Hx bilat retinal detachment, proliferative diab retinopathy and bilat vitreous hemorrhage   . Emphysema   . ESRD on hemodialysis ([place])    a. [facility] in [place], [place], on a TTS schedule.  She started dialysis in Feb 2014.  Etiology of renal failure not known, likely diabetes.  Has a left  upper arm AV graft.  . History of bronchitis    Mar 2012  . History of pneumonia    June 2012  . History of tobacco abuse    a. Quit 2012.  Marland Kitchen Hyperlipidemia   . Hypertension   . Moderate mitral insufficiency    a. 10/2013 Echo: EF 55-60%, mod MR.  . Myocardial infarct ([place]) 05/2013  . Peripheral vascular disease ([place])   . Renal insufficiency   . Sickle cell trait [facility])    Social History   Social History  . Marital status: Single    Spouse name: N/A  . Number of children: 2  . Years of education: N/A   Occupational History  . Not on file.   Social History Main Topics  . Smoking status: Former Smoker     Packs/day: 1.00    Years: 25.00    Quit date: 06/09/2010  . Smokeless tobacco: Never Used  . Alcohol use No  . Drug use: No  . Sexual activity: No   Other Topics Concern  . Not on file   Social History Narrative   On disability.    Lives alone.    2 children      Son drives her since her vision has decreased      Past Surgical History:  Procedure Laterality Date  . ABDOMINAL HYSTERECTOMY     2000  . CARDIAC CATHETERIZATION    . CARDIAC CATHETERIZATION N/A 05/02/2015   Procedure: Left Heart Cath and Coronary Angiography;  Surgeon: [name], MD;  Location: [place] CV LAB;  Service: Cardiovascular;  Laterality: N/A;  . COLONOSCOPY WITH PROPOFOL N/A 07/08/2016   Procedure: COLONOSCOPY WITH PROPOFOL;  Surgeon: [name], MD;  Location: [facility] ENDOSCOPY;  Service: Endoscopy;  Laterality: N/A;  . colonscopy    . CORONARY ANGIOPLASTY  05/28/2014   stent placement to the mid RCA  . DILATION AND CURETTAGE OF UTERUS     several in the early 80's  . ESOPHAGOGASTRODUODENOSCOPY     2012  . EYE SURGERY     bilateral laser 2012  . EYE SURGERY     right  . GAS INSERTION  09/30/2011   Procedure: INSERTION OF GAS;  Surgeon: [name], MD;  Location: [place];  Service: Ophthalmology;  Laterality: Right;  C3F8  . GAS/FLUID EXCHANGE  09/30/2011   Procedure: GAS/FLUID EXCHANGE;  Surgeon: [name], MD;  Location: [place];  Service: Ophthalmology;  Laterality: Right;  . LEFT HEART CATHETERIZATION WITH CORONARY ANGIOGRAM N/A 05/28/2013   Procedure: LEFT HEART CATHETERIZATION WITH CORONARY ANGIOGRAM;  Surgeon: [name], MD;  Location: [facility] CATH LAB;  Service: Cardiovascular;  Laterality: N/A;  . PARS PLANA VITRECTOMY  04/22/2011   Procedure: PARS PLANA VITRECTOMY WITH 25 GAUGE;  Surgeon: [name], MD;  Location: [place];  Service: Ophthalmology;  Laterality: Left;  membrane peel, endolaser, gas fluid exchange, silicone oil, repair of complex traction retinal  detachment  . PARS PLANA VITRECTOMY  09/30/2011   Procedure: PARS PLANA VITRECTOMY WITH 25 GAUGE;  Surgeon: [name], MD;  Location: [place];  Service: Ophthalmology;  Laterality: Right;  Endolaser; Repair of Complex Traction Retinal Detachment  . PARS PLANA VITRECTOMY  02/24/2012   Procedure: PARS PLANA VITRECTOMY WITH 25 GAUGE;  Surgeon: [name], MD;  Location: [place];  Service: Ophthalmology;  Laterality: Left;  . PTCA    . SILICON OIL REMOVAL  02/21/7828   Procedure: SILICON OIL REMOVAL;  Surgeon:  [name], MD;  Location: [place];  Service: Ophthalmology;  Laterality: Left;  . THROMBECTOMY / ARTERIOVENOUS GRAFT REVISION    . TUBAL LIGATION     1979   Family History  Problem Relation Age of Onset  . Stroke Mother   . Heart attack Mother   . Heart disease Mother   . Colon cancer Father   . Colon cancer Sister   . Heart attack Brother   . Stroke Brother   . Diabetes Brother   . Breast cancer Sister   . Diabetes Brother   . Diabetes Daughter   . Anesthesia problems Neg Hx   . Hypotension Neg Hx   . Malignant hyperthermia Neg Hx   . Pseudochol deficiency Neg Hx    Allergies  Allergen Reactions  . Hydrocodone Hives  . Metformin Diarrhea and Other (See Comments)    Other reaction(s): Distress (finding)  . Lisinopril       Assessment & Plan:  Patient presents to review vascular studies. Patient last seen on 03/05/2017 for evaluation of right lower extremity pain. Her pain is stable. Pain with and without activity. Denies rest pain or ulceration to the lower extremity. The patient underwent a bilateral ABI which was notable for noncompressible vessels due to medial calcification however her bilateral femoral and tibial arteries are triphasic. ABI with normal bilateral Doppler waveforms and toe brachial indices suggesting normal arterial perfusion to the bilateral lower extremity. Denies any fever, nausea vomiting.  1. PAD (peripheral artery disease) ([place]) -  Stable Patient with continued right lower extremity pain Palpable pedal pulses on exam Normal ABI today I do not feel the patient's discomfort is coming from any ischemia. Patient to follow up every 1-2 years with an ABI due to her multiple risk factors for peripheral artery disease  - VAS [place] ABI WITH/WO TBI; Future  2. Type 2 diabetes mellitus with chronic kidney disease on chronic dialysis, with long-term current use of insulin (HCC) - Stable Encouraged good control as its slows the progression of atherosclerotic disease  3. ESRD on hemodialysis ([place]) - Stable Denies any issues with dialysis at this time  4. Mixed hyperlipidemia - Stable Encouraged good control as its slows the progression of atherosclerotic disease  5. DDD (degenerative disc disease), lumbosacral - Stable I believe this is the patient's intervening factor to her lower extremity pain. Patient is being treated by a pain clinic for her discomfort  Current Outpatient Prescriptions on File Prior to Visit  Medication Sig Dispense Refill  . albuterol (PROVENTIL HFA;VENTOLIN HFA) 108 (90 BASE) MCG/ACT inhaler Inhale 2 puffs into the lungs every 6 (six) hours as needed for wheezing or shortness of breath.    Marland Kitchen albuterol (PROVENTIL) (2.5 MG/3ML) 0.083% nebulizer solution Take 2.5 mg by nebulization every 4 (four) hours as needed for wheezing or shortness of breath.    Marland Kitchen amLODipine (NORVASC) 10 MG tablet Take 10 mg by mouth daily.     Marland Kitchen aspirin EC 81 MG tablet Take 81 mg by mouth daily.    Marland Kitchen atorvastatin (LIPITOR) 40 MG tablet Take 1 tablet (40 mg total) by mouth daily. (Patient taking differently: Take 40 mg by mouth daily at 6 PM. ) 90 tablet 3  . carvedilol (COREG) 12.5 MG tablet IF BLOOD PRESSURE GREATER THAN 160, TAKE 2 TABLETS BY MOUTH TWICE DAILY, OTHERWISE TAKE 1 TABLET BY MOUTH TWICE DAILY. 70 tablet 6  . cinacalcet (SENSIPAR) 30 MG tablet Take 30 mg by mouth daily.    Marland Kitchen  cloNIDine (CATAPRES) 0.1 MG tablet Take 0.1 mg  by mouth 2 (two) times daily.    . furosemide (LASIX) 80 MG tablet Take 80 mg by mouth daily.     Marland Kitchen losartan (COZAAR) 50 MG tablet Take 50 mg by mouth daily.     . Melatonin 10 MG TABS Take 10 mg by mouth daily as needed (SLEEP).     . multivitamin (RENA-VIT) TABS tablet TAKE ONE TABLET BY MOUTH ONCE DAILY 30 tablet 5  . naproxen sodium (ANAPROX) 220 MG tablet Take 220 mg by mouth daily as needed.    . nitroGLYCERIN (NITROSTAT) 0.4 MG SL tablet Place 1 tablet (0.4 mg total) under the tongue every 5 (five) minutes as needed for chest pain. 25 tablet 3  . pregabalin (LYRICA) 75 MG capsule Take 1 capsule (75 mg total) by mouth daily. 90 capsule 1  . isosorbide mononitrate (IMDUR) 60 MG 24 hr tablet Take 1 tablet (60 mg total) by mouth daily. 90 tablet 3   Current Facility-Administered Medications on File Prior to Visit  Medication Dose Route Frequency Provider Last Rate Last Dose  . betamethasone acetate-betamethasone sodium phosphate (CELESTONE) injection 3 mg  3 mg Intramuscular Once Edrick Kins, DPM        There are no Patient Instructions on file for this visit. No Follow-up on file.   Deina Lipsey A Janaiya Beauchesne, PA-C

## 2017-04-10 DIAGNOSIS — Z01818 Encounter for other preprocedural examination: Secondary | ICD-10-CM

## 2017-04-10 DIAGNOSIS — Z7682 Awaiting organ transplant status: Principal | ICD-10-CM

## 2017-04-10 DIAGNOSIS — N186 End stage renal disease: Secondary | ICD-10-CM

## 2017-04-10 DIAGNOSIS — Z992 Dependence on renal dialysis: Secondary | ICD-10-CM | POA: Diagnosis not present

## 2017-04-10 DIAGNOSIS — D631 Anemia in chronic kidney disease: Secondary | ICD-10-CM | POA: Diagnosis not present

## 2017-04-10 DIAGNOSIS — N2581 Secondary hyperparathyroidism of renal origin: Secondary | ICD-10-CM | POA: Diagnosis not present

## 2017-04-10 DIAGNOSIS — D509 Iron deficiency anemia, unspecified: Secondary | ICD-10-CM | POA: Diagnosis not present

## 2017-04-13 DIAGNOSIS — D509 Iron deficiency anemia, unspecified: Secondary | ICD-10-CM | POA: Diagnosis not present

## 2017-04-13 DIAGNOSIS — D631 Anemia in chronic kidney disease: Secondary | ICD-10-CM | POA: Diagnosis not present

## 2017-04-13 DIAGNOSIS — N2581 Secondary hyperparathyroidism of renal origin: Secondary | ICD-10-CM | POA: Diagnosis not present

## 2017-04-13 DIAGNOSIS — Z992 Dependence on renal dialysis: Secondary | ICD-10-CM | POA: Diagnosis not present

## 2017-04-13 DIAGNOSIS — N186 End stage renal disease: Secondary | ICD-10-CM | POA: Diagnosis not present

## 2017-04-15 DIAGNOSIS — Z992 Dependence on renal dialysis: Secondary | ICD-10-CM | POA: Diagnosis not present

## 2017-04-15 DIAGNOSIS — N2581 Secondary hyperparathyroidism of renal origin: Secondary | ICD-10-CM | POA: Diagnosis not present

## 2017-04-15 DIAGNOSIS — D509 Iron deficiency anemia, unspecified: Secondary | ICD-10-CM | POA: Diagnosis not present

## 2017-04-15 DIAGNOSIS — N186 End stage renal disease: Secondary | ICD-10-CM | POA: Diagnosis not present

## 2017-04-15 DIAGNOSIS — D631 Anemia in chronic kidney disease: Secondary | ICD-10-CM | POA: Diagnosis not present

## 2017-04-17 DIAGNOSIS — D631 Anemia in chronic kidney disease: Secondary | ICD-10-CM | POA: Diagnosis not present

## 2017-04-17 DIAGNOSIS — Z992 Dependence on renal dialysis: Secondary | ICD-10-CM | POA: Diagnosis not present

## 2017-04-17 DIAGNOSIS — N186 End stage renal disease: Secondary | ICD-10-CM | POA: Diagnosis not present

## 2017-04-17 DIAGNOSIS — N2581 Secondary hyperparathyroidism of renal origin: Secondary | ICD-10-CM | POA: Diagnosis not present

## 2017-04-17 DIAGNOSIS — D509 Iron deficiency anemia, unspecified: Secondary | ICD-10-CM | POA: Diagnosis not present

## 2017-04-19 DIAGNOSIS — Z992 Dependence on renal dialysis: Secondary | ICD-10-CM | POA: Diagnosis not present

## 2017-04-19 DIAGNOSIS — N186 End stage renal disease: Secondary | ICD-10-CM | POA: Diagnosis not present

## 2017-04-19 DIAGNOSIS — D631 Anemia in chronic kidney disease: Secondary | ICD-10-CM | POA: Diagnosis not present

## 2017-04-19 DIAGNOSIS — N2581 Secondary hyperparathyroidism of renal origin: Secondary | ICD-10-CM | POA: Diagnosis not present

## 2017-04-19 DIAGNOSIS — D509 Iron deficiency anemia, unspecified: Secondary | ICD-10-CM | POA: Diagnosis not present

## 2017-04-20 ENCOUNTER — Ambulatory Visit
Admission: RE | Admit: 2017-04-20 | Discharge: 2017-04-20 | Payer: MEDICARE | Attending: Nephrology | Admitting: Nephrology

## 2017-04-20 DIAGNOSIS — D631 Anemia in chronic kidney disease: Secondary | ICD-10-CM | POA: Diagnosis not present

## 2017-04-20 DIAGNOSIS — N186 End stage renal disease: Secondary | ICD-10-CM | POA: Diagnosis not present

## 2017-04-20 DIAGNOSIS — N2581 Secondary hyperparathyroidism of renal origin: Secondary | ICD-10-CM | POA: Diagnosis not present

## 2017-04-20 DIAGNOSIS — Z992 Dependence on renal dialysis: Secondary | ICD-10-CM | POA: Diagnosis not present

## 2017-04-20 DIAGNOSIS — D509 Iron deficiency anemia, unspecified: Secondary | ICD-10-CM | POA: Diagnosis not present

## 2017-04-22 DIAGNOSIS — N2581 Secondary hyperparathyroidism of renal origin: Secondary | ICD-10-CM | POA: Diagnosis not present

## 2017-04-22 DIAGNOSIS — D509 Iron deficiency anemia, unspecified: Secondary | ICD-10-CM | POA: Diagnosis not present

## 2017-04-22 DIAGNOSIS — D631 Anemia in chronic kidney disease: Secondary | ICD-10-CM | POA: Diagnosis not present

## 2017-04-22 DIAGNOSIS — Z992 Dependence on renal dialysis: Secondary | ICD-10-CM | POA: Diagnosis not present

## 2017-04-22 DIAGNOSIS — N186 End stage renal disease: Secondary | ICD-10-CM | POA: Diagnosis not present

## 2017-04-24 DIAGNOSIS — Z01818 Encounter for other preprocedural examination: Secondary | ICD-10-CM

## 2017-04-24 DIAGNOSIS — N186 End stage renal disease: Secondary | ICD-10-CM

## 2017-04-24 DIAGNOSIS — Z7682 Awaiting organ transplant status: Principal | ICD-10-CM

## 2017-04-24 DIAGNOSIS — Z992 Dependence on renal dialysis: Secondary | ICD-10-CM | POA: Diagnosis not present

## 2017-04-24 DIAGNOSIS — D631 Anemia in chronic kidney disease: Secondary | ICD-10-CM | POA: Diagnosis not present

## 2017-04-24 DIAGNOSIS — N2581 Secondary hyperparathyroidism of renal origin: Secondary | ICD-10-CM | POA: Diagnosis not present

## 2017-04-24 DIAGNOSIS — D509 Iron deficiency anemia, unspecified: Secondary | ICD-10-CM | POA: Diagnosis not present

## 2017-04-26 DIAGNOSIS — D509 Iron deficiency anemia, unspecified: Secondary | ICD-10-CM | POA: Diagnosis not present

## 2017-04-26 DIAGNOSIS — N2581 Secondary hyperparathyroidism of renal origin: Secondary | ICD-10-CM | POA: Diagnosis not present

## 2017-04-26 DIAGNOSIS — D631 Anemia in chronic kidney disease: Secondary | ICD-10-CM | POA: Diagnosis not present

## 2017-04-26 DIAGNOSIS — Z992 Dependence on renal dialysis: Secondary | ICD-10-CM | POA: Diagnosis not present

## 2017-04-26 DIAGNOSIS — N186 End stage renal disease: Secondary | ICD-10-CM | POA: Diagnosis not present

## 2017-04-28 DIAGNOSIS — N186 End stage renal disease: Secondary | ICD-10-CM | POA: Diagnosis not present

## 2017-04-28 DIAGNOSIS — D509 Iron deficiency anemia, unspecified: Secondary | ICD-10-CM | POA: Diagnosis not present

## 2017-04-28 DIAGNOSIS — D631 Anemia in chronic kidney disease: Secondary | ICD-10-CM | POA: Diagnosis not present

## 2017-04-28 DIAGNOSIS — Z992 Dependence on renal dialysis: Secondary | ICD-10-CM | POA: Diagnosis not present

## 2017-04-28 DIAGNOSIS — N2581 Secondary hyperparathyroidism of renal origin: Secondary | ICD-10-CM | POA: Diagnosis not present

## 2017-05-01 DIAGNOSIS — D509 Iron deficiency anemia, unspecified: Secondary | ICD-10-CM | POA: Diagnosis not present

## 2017-05-01 DIAGNOSIS — N2581 Secondary hyperparathyroidism of renal origin: Secondary | ICD-10-CM | POA: Diagnosis not present

## 2017-05-01 DIAGNOSIS — N186 End stage renal disease: Secondary | ICD-10-CM | POA: Diagnosis not present

## 2017-05-01 DIAGNOSIS — Z992 Dependence on renal dialysis: Secondary | ICD-10-CM | POA: Diagnosis not present

## 2017-05-01 DIAGNOSIS — D631 Anemia in chronic kidney disease: Secondary | ICD-10-CM | POA: Diagnosis not present

## 2017-05-04 DIAGNOSIS — Z992 Dependence on renal dialysis: Secondary | ICD-10-CM | POA: Diagnosis not present

## 2017-05-04 DIAGNOSIS — N186 End stage renal disease: Secondary | ICD-10-CM | POA: Diagnosis not present

## 2017-05-04 DIAGNOSIS — D631 Anemia in chronic kidney disease: Secondary | ICD-10-CM | POA: Diagnosis not present

## 2017-05-04 DIAGNOSIS — D509 Iron deficiency anemia, unspecified: Secondary | ICD-10-CM | POA: Diagnosis not present

## 2017-05-04 DIAGNOSIS — N2581 Secondary hyperparathyroidism of renal origin: Secondary | ICD-10-CM | POA: Diagnosis not present

## 2017-05-06 DIAGNOSIS — N186 End stage renal disease: Secondary | ICD-10-CM | POA: Diagnosis not present

## 2017-05-06 DIAGNOSIS — D631 Anemia in chronic kidney disease: Secondary | ICD-10-CM | POA: Diagnosis not present

## 2017-05-06 DIAGNOSIS — Z992 Dependence on renal dialysis: Secondary | ICD-10-CM | POA: Diagnosis not present

## 2017-05-06 DIAGNOSIS — D509 Iron deficiency anemia, unspecified: Secondary | ICD-10-CM | POA: Diagnosis not present

## 2017-05-06 DIAGNOSIS — N2581 Secondary hyperparathyroidism of renal origin: Secondary | ICD-10-CM | POA: Diagnosis not present

## 2017-05-08 DIAGNOSIS — N186 End stage renal disease: Secondary | ICD-10-CM | POA: Diagnosis not present

## 2017-05-08 DIAGNOSIS — D631 Anemia in chronic kidney disease: Secondary | ICD-10-CM | POA: Diagnosis not present

## 2017-05-08 DIAGNOSIS — N2581 Secondary hyperparathyroidism of renal origin: Secondary | ICD-10-CM | POA: Diagnosis not present

## 2017-05-08 DIAGNOSIS — Z992 Dependence on renal dialysis: Secondary | ICD-10-CM | POA: Diagnosis not present

## 2017-05-08 DIAGNOSIS — D509 Iron deficiency anemia, unspecified: Secondary | ICD-10-CM | POA: Diagnosis not present

## 2017-05-09 DIAGNOSIS — Z992 Dependence on renal dialysis: Secondary | ICD-10-CM | POA: Diagnosis not present

## 2017-05-09 DIAGNOSIS — D631 Anemia in chronic kidney disease: Secondary | ICD-10-CM | POA: Diagnosis not present

## 2017-05-09 DIAGNOSIS — D509 Iron deficiency anemia, unspecified: Secondary | ICD-10-CM | POA: Diagnosis not present

## 2017-05-09 DIAGNOSIS — N2581 Secondary hyperparathyroidism of renal origin: Secondary | ICD-10-CM | POA: Diagnosis not present

## 2017-05-09 DIAGNOSIS — N186 End stage renal disease: Secondary | ICD-10-CM | POA: Diagnosis not present

## 2017-05-11 DIAGNOSIS — D631 Anemia in chronic kidney disease: Secondary | ICD-10-CM | POA: Diagnosis not present

## 2017-05-11 DIAGNOSIS — Z992 Dependence on renal dialysis: Secondary | ICD-10-CM | POA: Diagnosis not present

## 2017-05-11 DIAGNOSIS — N186 End stage renal disease: Secondary | ICD-10-CM | POA: Diagnosis not present

## 2017-05-11 DIAGNOSIS — D509 Iron deficiency anemia, unspecified: Secondary | ICD-10-CM | POA: Diagnosis not present

## 2017-05-11 DIAGNOSIS — N2581 Secondary hyperparathyroidism of renal origin: Secondary | ICD-10-CM | POA: Diagnosis not present

## 2017-05-12 ENCOUNTER — Ambulatory Visit
Admission: RE | Admit: 2017-05-12 | Discharge: 2017-05-12 | Payer: MEDICARE | Attending: Nephrology | Admitting: Nephrology

## 2017-05-13 DIAGNOSIS — Z992 Dependence on renal dialysis: Secondary | ICD-10-CM | POA: Diagnosis not present

## 2017-05-13 DIAGNOSIS — N2581 Secondary hyperparathyroidism of renal origin: Secondary | ICD-10-CM | POA: Diagnosis not present

## 2017-05-13 DIAGNOSIS — D509 Iron deficiency anemia, unspecified: Secondary | ICD-10-CM | POA: Diagnosis not present

## 2017-05-13 DIAGNOSIS — D631 Anemia in chronic kidney disease: Secondary | ICD-10-CM | POA: Diagnosis not present

## 2017-05-13 DIAGNOSIS — N186 End stage renal disease: Secondary | ICD-10-CM | POA: Diagnosis not present

## 2017-05-13 LAB — HM DIABETES EYE EXAM

## 2017-05-15 DIAGNOSIS — Z992 Dependence on renal dialysis: Secondary | ICD-10-CM | POA: Diagnosis not present

## 2017-05-15 DIAGNOSIS — D631 Anemia in chronic kidney disease: Secondary | ICD-10-CM | POA: Diagnosis not present

## 2017-05-15 DIAGNOSIS — N2581 Secondary hyperparathyroidism of renal origin: Secondary | ICD-10-CM | POA: Diagnosis not present

## 2017-05-15 DIAGNOSIS — N186 End stage renal disease: Secondary | ICD-10-CM | POA: Diagnosis not present

## 2017-05-15 DIAGNOSIS — D509 Iron deficiency anemia, unspecified: Secondary | ICD-10-CM | POA: Diagnosis not present

## 2017-05-16 DIAGNOSIS — D631 Anemia in chronic kidney disease: Secondary | ICD-10-CM | POA: Diagnosis not present

## 2017-05-16 DIAGNOSIS — Z992 Dependence on renal dialysis: Secondary | ICD-10-CM | POA: Diagnosis not present

## 2017-05-16 DIAGNOSIS — N2581 Secondary hyperparathyroidism of renal origin: Secondary | ICD-10-CM | POA: Diagnosis not present

## 2017-05-16 DIAGNOSIS — D509 Iron deficiency anemia, unspecified: Secondary | ICD-10-CM | POA: Diagnosis not present

## 2017-05-16 DIAGNOSIS — N186 End stage renal disease: Secondary | ICD-10-CM | POA: Diagnosis not present

## 2017-05-18 DIAGNOSIS — Z992 Dependence on renal dialysis: Secondary | ICD-10-CM | POA: Diagnosis not present

## 2017-05-18 DIAGNOSIS — D631 Anemia in chronic kidney disease: Secondary | ICD-10-CM | POA: Diagnosis not present

## 2017-05-18 DIAGNOSIS — D509 Iron deficiency anemia, unspecified: Secondary | ICD-10-CM | POA: Diagnosis not present

## 2017-05-18 DIAGNOSIS — N2581 Secondary hyperparathyroidism of renal origin: Secondary | ICD-10-CM | POA: Diagnosis not present

## 2017-05-18 DIAGNOSIS — N186 End stage renal disease: Secondary | ICD-10-CM | POA: Diagnosis not present

## 2017-05-19 DIAGNOSIS — N186 End stage renal disease: Secondary | ICD-10-CM

## 2017-05-19 DIAGNOSIS — Z01818 Encounter for other preprocedural examination: Secondary | ICD-10-CM

## 2017-05-19 DIAGNOSIS — Z7682 Awaiting organ transplant status: Principal | ICD-10-CM

## 2017-05-19 DIAGNOSIS — Z992 Dependence on renal dialysis: Secondary | ICD-10-CM | POA: Diagnosis not present

## 2017-05-19 DIAGNOSIS — N2581 Secondary hyperparathyroidism of renal origin: Secondary | ICD-10-CM | POA: Diagnosis not present

## 2017-05-19 DIAGNOSIS — D631 Anemia in chronic kidney disease: Secondary | ICD-10-CM | POA: Diagnosis not present

## 2017-05-19 DIAGNOSIS — D509 Iron deficiency anemia, unspecified: Secondary | ICD-10-CM | POA: Diagnosis not present

## 2017-05-20 DIAGNOSIS — D631 Anemia in chronic kidney disease: Secondary | ICD-10-CM | POA: Diagnosis not present

## 2017-05-20 DIAGNOSIS — N2581 Secondary hyperparathyroidism of renal origin: Secondary | ICD-10-CM | POA: Diagnosis not present

## 2017-05-20 DIAGNOSIS — N186 End stage renal disease: Secondary | ICD-10-CM | POA: Diagnosis not present

## 2017-05-20 DIAGNOSIS — D509 Iron deficiency anemia, unspecified: Secondary | ICD-10-CM | POA: Diagnosis not present

## 2017-05-20 DIAGNOSIS — Z992 Dependence on renal dialysis: Secondary | ICD-10-CM | POA: Diagnosis not present

## 2017-05-22 DIAGNOSIS — N186 End stage renal disease: Secondary | ICD-10-CM | POA: Diagnosis not present

## 2017-05-22 DIAGNOSIS — D631 Anemia in chronic kidney disease: Secondary | ICD-10-CM | POA: Diagnosis not present

## 2017-05-22 DIAGNOSIS — N2581 Secondary hyperparathyroidism of renal origin: Secondary | ICD-10-CM | POA: Diagnosis not present

## 2017-05-22 DIAGNOSIS — D509 Iron deficiency anemia, unspecified: Secondary | ICD-10-CM | POA: Diagnosis not present

## 2017-05-22 DIAGNOSIS — Z992 Dependence on renal dialysis: Secondary | ICD-10-CM | POA: Diagnosis not present

## 2017-05-25 DIAGNOSIS — Z992 Dependence on renal dialysis: Secondary | ICD-10-CM | POA: Diagnosis not present

## 2017-05-25 DIAGNOSIS — N2581 Secondary hyperparathyroidism of renal origin: Secondary | ICD-10-CM | POA: Diagnosis not present

## 2017-05-25 DIAGNOSIS — D631 Anemia in chronic kidney disease: Secondary | ICD-10-CM | POA: Diagnosis not present

## 2017-05-25 DIAGNOSIS — N186 End stage renal disease: Secondary | ICD-10-CM | POA: Diagnosis not present

## 2017-05-25 DIAGNOSIS — D509 Iron deficiency anemia, unspecified: Secondary | ICD-10-CM | POA: Diagnosis not present

## 2017-05-27 DIAGNOSIS — N186 End stage renal disease: Secondary | ICD-10-CM | POA: Diagnosis not present

## 2017-05-27 DIAGNOSIS — Z992 Dependence on renal dialysis: Secondary | ICD-10-CM | POA: Diagnosis not present

## 2017-05-27 DIAGNOSIS — D509 Iron deficiency anemia, unspecified: Secondary | ICD-10-CM | POA: Diagnosis not present

## 2017-05-27 DIAGNOSIS — N2581 Secondary hyperparathyroidism of renal origin: Secondary | ICD-10-CM | POA: Diagnosis not present

## 2017-05-27 DIAGNOSIS — D631 Anemia in chronic kidney disease: Secondary | ICD-10-CM | POA: Diagnosis not present

## 2017-05-29 DIAGNOSIS — D631 Anemia in chronic kidney disease: Secondary | ICD-10-CM | POA: Diagnosis not present

## 2017-05-29 DIAGNOSIS — N2581 Secondary hyperparathyroidism of renal origin: Secondary | ICD-10-CM | POA: Diagnosis not present

## 2017-05-29 DIAGNOSIS — N186 End stage renal disease: Secondary | ICD-10-CM | POA: Diagnosis not present

## 2017-05-29 DIAGNOSIS — D509 Iron deficiency anemia, unspecified: Secondary | ICD-10-CM | POA: Diagnosis not present

## 2017-05-29 DIAGNOSIS — Z992 Dependence on renal dialysis: Secondary | ICD-10-CM | POA: Diagnosis not present

## 2017-05-31 DIAGNOSIS — D631 Anemia in chronic kidney disease: Secondary | ICD-10-CM | POA: Diagnosis not present

## 2017-05-31 DIAGNOSIS — D509 Iron deficiency anemia, unspecified: Secondary | ICD-10-CM | POA: Diagnosis not present

## 2017-05-31 DIAGNOSIS — N2581 Secondary hyperparathyroidism of renal origin: Secondary | ICD-10-CM | POA: Diagnosis not present

## 2017-05-31 DIAGNOSIS — Z992 Dependence on renal dialysis: Secondary | ICD-10-CM | POA: Diagnosis not present

## 2017-05-31 DIAGNOSIS — N186 End stage renal disease: Secondary | ICD-10-CM | POA: Diagnosis not present

## 2017-06-03 DIAGNOSIS — Z992 Dependence on renal dialysis: Secondary | ICD-10-CM | POA: Diagnosis not present

## 2017-06-03 DIAGNOSIS — N186 End stage renal disease: Secondary | ICD-10-CM | POA: Diagnosis not present

## 2017-06-03 DIAGNOSIS — D631 Anemia in chronic kidney disease: Secondary | ICD-10-CM | POA: Diagnosis not present

## 2017-06-03 DIAGNOSIS — D509 Iron deficiency anemia, unspecified: Secondary | ICD-10-CM | POA: Diagnosis not present

## 2017-06-03 DIAGNOSIS — N2581 Secondary hyperparathyroidism of renal origin: Secondary | ICD-10-CM | POA: Diagnosis not present

## 2017-06-04 ENCOUNTER — Ambulatory Visit (INDEPENDENT_AMBULATORY_CARE_PROVIDER_SITE_OTHER): Payer: Medicare Other

## 2017-06-04 DIAGNOSIS — I6523 Occlusion and stenosis of bilateral carotid arteries: Secondary | ICD-10-CM

## 2017-06-05 DIAGNOSIS — D631 Anemia in chronic kidney disease: Secondary | ICD-10-CM | POA: Diagnosis not present

## 2017-06-05 DIAGNOSIS — N2581 Secondary hyperparathyroidism of renal origin: Secondary | ICD-10-CM | POA: Diagnosis not present

## 2017-06-05 DIAGNOSIS — Z992 Dependence on renal dialysis: Secondary | ICD-10-CM | POA: Diagnosis not present

## 2017-06-05 DIAGNOSIS — D509 Iron deficiency anemia, unspecified: Secondary | ICD-10-CM | POA: Diagnosis not present

## 2017-06-05 DIAGNOSIS — N186 End stage renal disease: Secondary | ICD-10-CM | POA: Diagnosis not present

## 2017-06-05 LAB — VAS US CAROTID
LCCADDIAS: -13 cm/s
LCCADSYS: -76 cm/s
LCCAPDIAS: 9 cm/s
LEFT VERTEBRAL DIAS: 7 cm/s
LICADDIAS: 18 cm/s
LICADSYS: 96 cm/s
LICAPDIAS: -27 cm/s
LICAPSYS: -151 cm/s
Left CCA prox sys: 71 cm/s
RCCAPDIAS: -12 cm/s
RCCAPSYS: -76 cm/s
RIGHT VERTEBRAL DIAS: -21 cm/s
Right cca dist sys: -121 cm/s

## 2017-06-08 DIAGNOSIS — Z992 Dependence on renal dialysis: Secondary | ICD-10-CM | POA: Diagnosis not present

## 2017-06-08 DIAGNOSIS — N2581 Secondary hyperparathyroidism of renal origin: Secondary | ICD-10-CM | POA: Diagnosis not present

## 2017-06-08 DIAGNOSIS — N186 End stage renal disease: Secondary | ICD-10-CM | POA: Diagnosis not present

## 2017-06-08 DIAGNOSIS — D631 Anemia in chronic kidney disease: Secondary | ICD-10-CM | POA: Diagnosis not present

## 2017-06-08 DIAGNOSIS — D509 Iron deficiency anemia, unspecified: Secondary | ICD-10-CM | POA: Diagnosis not present

## 2017-06-09 DIAGNOSIS — Z992 Dependence on renal dialysis: Secondary | ICD-10-CM | POA: Diagnosis not present

## 2017-06-09 DIAGNOSIS — D631 Anemia in chronic kidney disease: Secondary | ICD-10-CM | POA: Diagnosis not present

## 2017-06-09 DIAGNOSIS — D509 Iron deficiency anemia, unspecified: Secondary | ICD-10-CM | POA: Diagnosis not present

## 2017-06-09 DIAGNOSIS — N2581 Secondary hyperparathyroidism of renal origin: Secondary | ICD-10-CM | POA: Diagnosis not present

## 2017-06-09 DIAGNOSIS — N186 End stage renal disease: Secondary | ICD-10-CM | POA: Diagnosis not present

## 2017-06-10 ENCOUNTER — Telehealth: Payer: Self-pay | Admitting: *Deleted

## 2017-06-10 DIAGNOSIS — D631 Anemia in chronic kidney disease: Secondary | ICD-10-CM | POA: Diagnosis not present

## 2017-06-10 DIAGNOSIS — E041 Nontoxic single thyroid nodule: Secondary | ICD-10-CM

## 2017-06-10 DIAGNOSIS — Z992 Dependence on renal dialysis: Secondary | ICD-10-CM | POA: Diagnosis not present

## 2017-06-10 DIAGNOSIS — D509 Iron deficiency anemia, unspecified: Secondary | ICD-10-CM | POA: Diagnosis not present

## 2017-06-10 DIAGNOSIS — N186 End stage renal disease: Secondary | ICD-10-CM | POA: Diagnosis not present

## 2017-06-10 DIAGNOSIS — N2581 Secondary hyperparathyroidism of renal origin: Secondary | ICD-10-CM | POA: Diagnosis not present

## 2017-06-10 NOTE — Telephone Encounter (Signed)
Reviewed results and recommendations with patient. Order placed for thyroid evaluation and provided her with number to call and schedule at [facility] scheduling [phone]. She read back number and had no further questions at this time. Advised that if positive we would refer back to her PCP or endocrinology. She verbalized understanding of our conversation, agreement with plan, and had no further questions at this time.

## 2017-06-10 NOTE — Telephone Encounter (Signed)
-----   Message from Minna Merritts, MD sent at 06/08/2017  2:48 PM EST ----- Carotid ultrasound Blockage on this study appears less than prior blockage in 2017 done in the hospital New finding of thyroid nodule 1 cm in size Can we order designated thyroid ultrasound for further evaluation If positive would then refer back to primary care physician or endocrinology

## 2017-06-12 DIAGNOSIS — N186 End stage renal disease: Secondary | ICD-10-CM | POA: Diagnosis not present

## 2017-06-12 DIAGNOSIS — N2581 Secondary hyperparathyroidism of renal origin: Secondary | ICD-10-CM | POA: Diagnosis not present

## 2017-06-12 DIAGNOSIS — Z992 Dependence on renal dialysis: Secondary | ICD-10-CM | POA: Diagnosis not present

## 2017-06-12 DIAGNOSIS — D509 Iron deficiency anemia, unspecified: Secondary | ICD-10-CM | POA: Diagnosis not present

## 2017-06-12 DIAGNOSIS — D631 Anemia in chronic kidney disease: Secondary | ICD-10-CM | POA: Diagnosis not present

## 2017-06-15 ENCOUNTER — Encounter: Admit: 2017-06-15 | Discharge: 2017-06-15 | Payer: MEDICARE | Attending: Nephrology | Primary: Nephrology

## 2017-06-15 DIAGNOSIS — D509 Iron deficiency anemia, unspecified: Secondary | ICD-10-CM | POA: Diagnosis not present

## 2017-06-15 DIAGNOSIS — Z992 Dependence on renal dialysis: Secondary | ICD-10-CM | POA: Diagnosis not present

## 2017-06-15 DIAGNOSIS — N2581 Secondary hyperparathyroidism of renal origin: Secondary | ICD-10-CM | POA: Diagnosis not present

## 2017-06-15 DIAGNOSIS — N186 End stage renal disease: Secondary | ICD-10-CM | POA: Diagnosis not present

## 2017-06-15 DIAGNOSIS — D631 Anemia in chronic kidney disease: Secondary | ICD-10-CM | POA: Diagnosis not present

## 2017-06-16 ENCOUNTER — Ambulatory Visit
Admission: RE | Admit: 2017-06-16 | Discharge: 2017-06-16 | Disposition: A | Discharge status: Home / Self Care | Payer: Medicare Other | Source: Ambulatory Visit | Attending: Cardiovascular Disease | Admitting: Cardiovascular Disease

## 2017-06-16 DIAGNOSIS — E042 Nontoxic multinodular goiter: Secondary | ICD-10-CM | POA: Diagnosis not present

## 2017-06-16 DIAGNOSIS — E041 Nontoxic single thyroid nodule: Secondary | ICD-10-CM

## 2017-06-17 DIAGNOSIS — N186 End stage renal disease: Secondary | ICD-10-CM | POA: Diagnosis not present

## 2017-06-17 DIAGNOSIS — N2581 Secondary hyperparathyroidism of renal origin: Secondary | ICD-10-CM | POA: Diagnosis not present

## 2017-06-17 DIAGNOSIS — Z992 Dependence on renal dialysis: Secondary | ICD-10-CM | POA: Diagnosis not present

## 2017-06-17 DIAGNOSIS — D509 Iron deficiency anemia, unspecified: Secondary | ICD-10-CM | POA: Diagnosis not present

## 2017-06-17 DIAGNOSIS — D631 Anemia in chronic kidney disease: Secondary | ICD-10-CM | POA: Diagnosis not present

## 2017-06-18 DIAGNOSIS — Z992 Dependence on renal dialysis: Secondary | ICD-10-CM | POA: Diagnosis not present

## 2017-06-18 DIAGNOSIS — D509 Iron deficiency anemia, unspecified: Secondary | ICD-10-CM | POA: Diagnosis not present

## 2017-06-18 DIAGNOSIS — N186 End stage renal disease: Secondary | ICD-10-CM | POA: Diagnosis not present

## 2017-06-18 DIAGNOSIS — N2581 Secondary hyperparathyroidism of renal origin: Secondary | ICD-10-CM | POA: Diagnosis not present

## 2017-06-18 DIAGNOSIS — D631 Anemia in chronic kidney disease: Secondary | ICD-10-CM | POA: Diagnosis not present

## 2017-06-19 ENCOUNTER — Telehealth: Payer: Self-pay | Admitting: *Deleted

## 2017-06-19 DIAGNOSIS — Z01818 Encounter for other preprocedural examination: Secondary | ICD-10-CM

## 2017-06-19 DIAGNOSIS — Z7682 Awaiting organ transplant status: Principal | ICD-10-CM

## 2017-06-19 DIAGNOSIS — N186 End stage renal disease: Secondary | ICD-10-CM

## 2017-06-19 DIAGNOSIS — D631 Anemia in chronic kidney disease: Secondary | ICD-10-CM | POA: Diagnosis not present

## 2017-06-19 DIAGNOSIS — N2581 Secondary hyperparathyroidism of renal origin: Secondary | ICD-10-CM | POA: Diagnosis not present

## 2017-06-19 DIAGNOSIS — D509 Iron deficiency anemia, unspecified: Secondary | ICD-10-CM | POA: Diagnosis not present

## 2017-06-19 DIAGNOSIS — Z992 Dependence on renal dialysis: Secondary | ICD-10-CM | POA: Diagnosis not present

## 2017-06-19 NOTE — Telephone Encounter (Signed)
Reviewed results and recommendations with patient and advised that I would route this message over to her PCP for further review and consultation. She verbalized understanding with no further questions or concerns at this time. Will send message over to her PCP to see if they are agreeable with referral to ENT.

## 2017-06-19 NOTE — Telephone Encounter (Signed)
-----   Message from Minna Merritts, MD sent at 06/18/2017 10:53 AM EST ----- Thyroid mass, unclear significance Would refer to ENT, mcquee/vaught for further consultation Check with PMD if that is the way they would go

## 2017-06-20 ENCOUNTER — Other Ambulatory Visit: Payer: Self-pay | Admitting: Internal Medicine

## 2017-06-20 DIAGNOSIS — E0789 Other specified disorders of thyroid: Secondary | ICD-10-CM

## 2017-06-22 DIAGNOSIS — N2581 Secondary hyperparathyroidism of renal origin: Secondary | ICD-10-CM | POA: Diagnosis not present

## 2017-06-22 DIAGNOSIS — E119 Type 2 diabetes mellitus without complications: Secondary | ICD-10-CM | POA: Diagnosis not present

## 2017-06-22 DIAGNOSIS — D631 Anemia in chronic kidney disease: Secondary | ICD-10-CM | POA: Diagnosis not present

## 2017-06-22 DIAGNOSIS — N186 End stage renal disease: Secondary | ICD-10-CM | POA: Diagnosis not present

## 2017-06-22 DIAGNOSIS — Z992 Dependence on renal dialysis: Secondary | ICD-10-CM | POA: Diagnosis not present

## 2017-06-22 DIAGNOSIS — D509 Iron deficiency anemia, unspecified: Secondary | ICD-10-CM | POA: Diagnosis not present

## 2017-06-22 NOTE — Telephone Encounter (Signed)
Yes, the referral to ENT has been placed. Thanks for ordering the [place]

## 2017-06-24 ENCOUNTER — Other Ambulatory Visit: Payer: Self-pay | Admitting: *Deleted

## 2017-06-24 DIAGNOSIS — N2581 Secondary hyperparathyroidism of renal origin: Secondary | ICD-10-CM | POA: Diagnosis not present

## 2017-06-24 DIAGNOSIS — D631 Anemia in chronic kidney disease: Secondary | ICD-10-CM | POA: Diagnosis not present

## 2017-06-24 DIAGNOSIS — N186 End stage renal disease: Secondary | ICD-10-CM | POA: Diagnosis not present

## 2017-06-24 DIAGNOSIS — I6523 Occlusion and stenosis of bilateral carotid arteries: Secondary | ICD-10-CM

## 2017-06-24 DIAGNOSIS — Z992 Dependence on renal dialysis: Secondary | ICD-10-CM | POA: Diagnosis not present

## 2017-06-24 DIAGNOSIS — D509 Iron deficiency anemia, unspecified: Secondary | ICD-10-CM | POA: Diagnosis not present

## 2017-06-26 DIAGNOSIS — D509 Iron deficiency anemia, unspecified: Secondary | ICD-10-CM | POA: Diagnosis not present

## 2017-06-26 DIAGNOSIS — N186 End stage renal disease: Secondary | ICD-10-CM | POA: Diagnosis not present

## 2017-06-26 DIAGNOSIS — N2581 Secondary hyperparathyroidism of renal origin: Secondary | ICD-10-CM | POA: Diagnosis not present

## 2017-06-26 DIAGNOSIS — Z992 Dependence on renal dialysis: Secondary | ICD-10-CM | POA: Diagnosis not present

## 2017-06-26 DIAGNOSIS — D631 Anemia in chronic kidney disease: Secondary | ICD-10-CM | POA: Diagnosis not present

## 2017-06-29 DIAGNOSIS — N186 End stage renal disease: Secondary | ICD-10-CM | POA: Diagnosis not present

## 2017-06-29 DIAGNOSIS — D509 Iron deficiency anemia, unspecified: Secondary | ICD-10-CM | POA: Diagnosis not present

## 2017-06-29 DIAGNOSIS — Z992 Dependence on renal dialysis: Secondary | ICD-10-CM | POA: Diagnosis not present

## 2017-06-29 DIAGNOSIS — N2581 Secondary hyperparathyroidism of renal origin: Secondary | ICD-10-CM | POA: Diagnosis not present

## 2017-06-29 DIAGNOSIS — D631 Anemia in chronic kidney disease: Secondary | ICD-10-CM | POA: Diagnosis not present

## 2017-06-30 DIAGNOSIS — M5136 Other intervertebral disc degeneration, lumbar region: Secondary | ICD-10-CM | POA: Diagnosis not present

## 2017-06-30 DIAGNOSIS — M5416 Radiculopathy, lumbar region: Secondary | ICD-10-CM | POA: Diagnosis not present

## 2017-07-01 DIAGNOSIS — D509 Iron deficiency anemia, unspecified: Secondary | ICD-10-CM | POA: Diagnosis not present

## 2017-07-01 DIAGNOSIS — N186 End stage renal disease: Secondary | ICD-10-CM | POA: Diagnosis not present

## 2017-07-01 DIAGNOSIS — N2581 Secondary hyperparathyroidism of renal origin: Secondary | ICD-10-CM | POA: Diagnosis not present

## 2017-07-01 DIAGNOSIS — Z992 Dependence on renal dialysis: Secondary | ICD-10-CM | POA: Diagnosis not present

## 2017-07-01 DIAGNOSIS — D631 Anemia in chronic kidney disease: Secondary | ICD-10-CM | POA: Diagnosis not present

## 2017-07-02 ENCOUNTER — Encounter (INDEPENDENT_AMBULATORY_CARE_PROVIDER_SITE_OTHER): Payer: Medicare Other

## 2017-07-02 ENCOUNTER — Ambulatory Visit (INDEPENDENT_AMBULATORY_CARE_PROVIDER_SITE_OTHER): Payer: Medicare Other | Admitting: Vascular Surgery

## 2017-07-03 DIAGNOSIS — N2581 Secondary hyperparathyroidism of renal origin: Secondary | ICD-10-CM | POA: Diagnosis not present

## 2017-07-03 DIAGNOSIS — D509 Iron deficiency anemia, unspecified: Secondary | ICD-10-CM | POA: Diagnosis not present

## 2017-07-03 DIAGNOSIS — Z992 Dependence on renal dialysis: Secondary | ICD-10-CM | POA: Diagnosis not present

## 2017-07-03 DIAGNOSIS — D631 Anemia in chronic kidney disease: Secondary | ICD-10-CM | POA: Diagnosis not present

## 2017-07-03 DIAGNOSIS — N186 End stage renal disease: Secondary | ICD-10-CM | POA: Diagnosis not present

## 2017-07-06 ENCOUNTER — Ambulatory Visit: Payer: Medicare Other | Admitting: Podiatry

## 2017-07-06 DIAGNOSIS — D631 Anemia in chronic kidney disease: Secondary | ICD-10-CM | POA: Diagnosis not present

## 2017-07-06 DIAGNOSIS — N186 End stage renal disease: Secondary | ICD-10-CM | POA: Diagnosis not present

## 2017-07-06 DIAGNOSIS — N2581 Secondary hyperparathyroidism of renal origin: Secondary | ICD-10-CM | POA: Diagnosis not present

## 2017-07-06 DIAGNOSIS — Z992 Dependence on renal dialysis: Secondary | ICD-10-CM | POA: Diagnosis not present

## 2017-07-06 DIAGNOSIS — D509 Iron deficiency anemia, unspecified: Secondary | ICD-10-CM | POA: Diagnosis not present

## 2017-07-07 ENCOUNTER — Ambulatory Visit: Admit: 2017-07-07 | Discharge: 2017-07-07 | Payer: MEDICARE

## 2017-07-07 ENCOUNTER — Encounter: Admit: 2017-07-07 | Discharge: 2017-07-07 | Payer: MEDICARE

## 2017-07-07 ENCOUNTER — Ambulatory Visit: Admit: 2017-07-07 | Discharge: 2017-07-20 | Payer: MEDICARE

## 2017-07-07 ENCOUNTER — Encounter: Admit: 2017-07-07 | Discharge: 2017-07-20 | Payer: MEDICARE

## 2017-07-07 ENCOUNTER — Ambulatory Visit: Admit: 2017-07-07 | Discharge: 2017-07-08 | Payer: MEDICARE

## 2017-07-07 ENCOUNTER — Encounter: Admit: 2017-07-07 | Discharge: 2017-07-08 | Payer: MEDICARE

## 2017-07-07 DIAGNOSIS — Z01810 Encounter for preprocedural cardiovascular examination: Secondary | ICD-10-CM

## 2017-07-07 DIAGNOSIS — E1122 Type 2 diabetes mellitus with diabetic chronic kidney disease: Secondary | ICD-10-CM

## 2017-07-07 DIAGNOSIS — N186 End stage renal disease: Secondary | ICD-10-CM

## 2017-07-07 DIAGNOSIS — Z01818 Encounter for other preprocedural examination: Principal | ICD-10-CM

## 2017-07-07 DIAGNOSIS — Z7289 Other problems related to lifestyle: Secondary | ICD-10-CM

## 2017-07-07 DIAGNOSIS — R944 Abnormal results of kidney function studies: Principal | ICD-10-CM

## 2017-07-07 DIAGNOSIS — I120 Hypertensive chronic kidney disease with stage 5 chronic kidney disease or end stage renal disease: Secondary | ICD-10-CM

## 2017-07-07 DIAGNOSIS — Z113 Encounter for screening for infections with a predominantly sexual mode of transmission: Secondary | ICD-10-CM

## 2017-07-07 DIAGNOSIS — N189 Chronic kidney disease, unspecified: Secondary | ICD-10-CM | POA: Diagnosis not present

## 2017-07-08 DIAGNOSIS — Z992 Dependence on renal dialysis: Secondary | ICD-10-CM | POA: Diagnosis not present

## 2017-07-08 DIAGNOSIS — N2581 Secondary hyperparathyroidism of renal origin: Secondary | ICD-10-CM | POA: Diagnosis not present

## 2017-07-08 DIAGNOSIS — N186 End stage renal disease: Secondary | ICD-10-CM | POA: Diagnosis not present

## 2017-07-08 DIAGNOSIS — D509 Iron deficiency anemia, unspecified: Secondary | ICD-10-CM | POA: Diagnosis not present

## 2017-07-08 DIAGNOSIS — D631 Anemia in chronic kidney disease: Secondary | ICD-10-CM | POA: Diagnosis not present

## 2017-07-09 DIAGNOSIS — N186 End stage renal disease: Secondary | ICD-10-CM | POA: Diagnosis not present

## 2017-07-09 DIAGNOSIS — Z992 Dependence on renal dialysis: Secondary | ICD-10-CM | POA: Diagnosis not present

## 2017-07-10 DIAGNOSIS — D631 Anemia in chronic kidney disease: Secondary | ICD-10-CM | POA: Diagnosis not present

## 2017-07-10 DIAGNOSIS — N2581 Secondary hyperparathyroidism of renal origin: Secondary | ICD-10-CM | POA: Diagnosis not present

## 2017-07-10 DIAGNOSIS — Z992 Dependence on renal dialysis: Secondary | ICD-10-CM | POA: Diagnosis not present

## 2017-07-10 DIAGNOSIS — D509 Iron deficiency anemia, unspecified: Secondary | ICD-10-CM | POA: Diagnosis not present

## 2017-07-10 DIAGNOSIS — N186 End stage renal disease: Secondary | ICD-10-CM | POA: Diagnosis not present

## 2017-07-13 ENCOUNTER — Encounter: Admit: 2017-07-13 | Discharge: 2017-07-13 | Payer: MEDICARE | Attending: Nephrology | Primary: Nephrology

## 2017-07-13 DIAGNOSIS — D509 Iron deficiency anemia, unspecified: Secondary | ICD-10-CM | POA: Diagnosis not present

## 2017-07-13 DIAGNOSIS — Z992 Dependence on renal dialysis: Secondary | ICD-10-CM | POA: Diagnosis not present

## 2017-07-13 DIAGNOSIS — D631 Anemia in chronic kidney disease: Secondary | ICD-10-CM | POA: Diagnosis not present

## 2017-07-13 DIAGNOSIS — N186 End stage renal disease: Secondary | ICD-10-CM | POA: Diagnosis not present

## 2017-07-13 DIAGNOSIS — N2581 Secondary hyperparathyroidism of renal origin: Secondary | ICD-10-CM | POA: Diagnosis not present

## 2017-07-15 ENCOUNTER — Other Ambulatory Visit: Payer: Self-pay

## 2017-07-15 ENCOUNTER — Emergency Department
Admission: EM | Admit: 2017-07-15 | Discharge: 2017-07-15 | Disposition: A | Discharge status: Home / Self Care | Payer: Medicare Other | Attending: Emergency Medicine | Admitting: Emergency Medicine

## 2017-07-15 ENCOUNTER — Emergency Department: Payer: Medicare Other

## 2017-07-15 ENCOUNTER — Encounter: Payer: Self-pay | Admitting: Emergency Medicine

## 2017-07-15 DIAGNOSIS — E1122 Type 2 diabetes mellitus with diabetic chronic kidney disease: Secondary | ICD-10-CM | POA: Insufficient documentation

## 2017-07-15 DIAGNOSIS — I5032 Chronic diastolic (congestive) heart failure: Secondary | ICD-10-CM | POA: Diagnosis not present

## 2017-07-15 DIAGNOSIS — Z955 Presence of coronary angioplasty implant and graft: Secondary | ICD-10-CM | POA: Insufficient documentation

## 2017-07-15 DIAGNOSIS — Z992 Dependence on renal dialysis: Secondary | ICD-10-CM | POA: Diagnosis not present

## 2017-07-15 DIAGNOSIS — J449 Chronic obstructive pulmonary disease, unspecified: Secondary | ICD-10-CM | POA: Insufficient documentation

## 2017-07-15 DIAGNOSIS — E11319 Type 2 diabetes mellitus with unspecified diabetic retinopathy without macular edema: Secondary | ICD-10-CM | POA: Insufficient documentation

## 2017-07-15 DIAGNOSIS — R0602 Shortness of breath: Secondary | ICD-10-CM | POA: Diagnosis not present

## 2017-07-15 DIAGNOSIS — D631 Anemia in chronic kidney disease: Secondary | ICD-10-CM | POA: Diagnosis not present

## 2017-07-15 DIAGNOSIS — R112 Nausea with vomiting, unspecified: Secondary | ICD-10-CM | POA: Diagnosis not present

## 2017-07-15 DIAGNOSIS — E1140 Type 2 diabetes mellitus with diabetic neuropathy, unspecified: Secondary | ICD-10-CM | POA: Diagnosis not present

## 2017-07-15 DIAGNOSIS — R509 Fever, unspecified: Secondary | ICD-10-CM | POA: Diagnosis not present

## 2017-07-15 DIAGNOSIS — D509 Iron deficiency anemia, unspecified: Secondary | ICD-10-CM | POA: Diagnosis not present

## 2017-07-15 DIAGNOSIS — R05 Cough: Secondary | ICD-10-CM | POA: Diagnosis not present

## 2017-07-15 DIAGNOSIS — R079 Chest pain, unspecified: Secondary | ICD-10-CM | POA: Diagnosis not present

## 2017-07-15 DIAGNOSIS — Z79899 Other long term (current) drug therapy: Secondary | ICD-10-CM | POA: Diagnosis not present

## 2017-07-15 DIAGNOSIS — N2581 Secondary hyperparathyroidism of renal origin: Secondary | ICD-10-CM | POA: Diagnosis not present

## 2017-07-15 DIAGNOSIS — I2510 Atherosclerotic heart disease of native coronary artery without angina pectoris: Secondary | ICD-10-CM | POA: Insufficient documentation

## 2017-07-15 DIAGNOSIS — J101 Influenza due to other identified influenza virus with other respiratory manifestations: Secondary | ICD-10-CM

## 2017-07-15 DIAGNOSIS — I132 Hypertensive heart and chronic kidney disease with heart failure and with stage 5 chronic kidney disease, or end stage renal disease: Secondary | ICD-10-CM | POA: Insufficient documentation

## 2017-07-15 DIAGNOSIS — Z7982 Long term (current) use of aspirin: Secondary | ICD-10-CM | POA: Insufficient documentation

## 2017-07-15 DIAGNOSIS — J111 Influenza due to unidentified influenza virus with other respiratory manifestations: Secondary | ICD-10-CM | POA: Diagnosis not present

## 2017-07-15 DIAGNOSIS — N186 End stage renal disease: Secondary | ICD-10-CM | POA: Insufficient documentation

## 2017-07-15 DIAGNOSIS — I110 Hypertensive heart disease with heart failure: Secondary | ICD-10-CM | POA: Insufficient documentation

## 2017-07-15 DIAGNOSIS — Z87891 Personal history of nicotine dependence: Secondary | ICD-10-CM | POA: Insufficient documentation

## 2017-07-15 LAB — CBC
HCT: 27 % — ABNORMAL LOW (ref 35.0–47.0)
HEMOGLOBIN: 9.4 g/dL — AB (ref 12.0–16.0)
MCH: 34.1 pg — ABNORMAL HIGH (ref 26.0–34.0)
MCHC: 34.9 g/dL (ref 32.0–36.0)
MCV: 97.7 fL (ref 80.0–100.0)
Platelets: 114 10*3/uL — ABNORMAL LOW (ref 150–440)
RBC: 2.77 MIL/uL — ABNORMAL LOW (ref 3.80–5.20)
RDW: 14.6 % — AB (ref 11.5–14.5)
WBC: 6.6 10*3/uL (ref 3.6–11.0)

## 2017-07-15 LAB — COMPREHENSIVE METABOLIC PANEL
ALK PHOS: 89 U/L (ref 38–126)
ALT: 26 U/L (ref 14–54)
ANION GAP: 13 (ref 5–15)
AST: 35 U/L (ref 15–41)
Albumin: 3.7 g/dL (ref 3.5–5.0)
BUN: 15 mg/dL (ref 6–20)
CHLORIDE: 95 mmol/L — AB (ref 101–111)
CO2: 29 mmol/L (ref 22–32)
Calcium: 8.7 mg/dL — ABNORMAL LOW (ref 8.9–10.3)
Creatinine, Ser: 5.29 mg/dL — ABNORMAL HIGH (ref 0.44–1.00)
GFR calc Af Amer: 9 mL/min — ABNORMAL LOW (ref >60)
GFR calc non Af Amer: 8 mL/min — ABNORMAL LOW (ref >60)
GLUCOSE: 131 mg/dL — AB (ref 65–99)
POTASSIUM: 3.3 mmol/L — AB (ref 3.5–5.1)
SODIUM: 137 mmol/L (ref 135–145)
Total Bilirubin: 0.8 mg/dL (ref 0.3–1.2)
Total Protein: 6.8 g/dL (ref 6.5–8.1)

## 2017-07-15 LAB — LACTIC ACID, PLASMA: Lactic Acid, Venous: 0.8 mmol/L (ref 0.5–1.9)

## 2017-07-15 LAB — INFLUENZA PANEL BY PCR (TYPE A & B)
Influenza A By PCR: POSITIVE — AB
Influenza B By PCR: NEGATIVE

## 2017-07-15 MED ORDER — OSELTAMIVIR PHOSPHATE 30 MG PO CAPS
30.0000 mg | ORAL_CAPSULE | Freq: Once | ORAL | Status: AC
  Administered 2017-07-15: 30 mg via ORAL
  Filled 2017-07-15 (×2): qty 1

## 2017-07-15 MED ORDER — ACETAMINOPHEN 500 MG PO TABS
1000.0000 mg | ORAL_TABLET | Freq: Once | ORAL | Status: AC
  Administered 2017-07-15: 1000 mg via ORAL

## 2017-07-15 MED ORDER — ACETAMINOPHEN 500 MG PO TABS
ORAL_TABLET | ORAL | Status: AC
  Administered 2017-07-15: 1000 mg via ORAL
  Filled 2017-07-15: qty 2

## 2017-07-15 MED ORDER — OSELTAMIVIR PHOSPHATE 30 MG PO CAPS: 30.0000 mg | ORAL_CAPSULE | Freq: Two times a day (BID) | ORAL | 0 refills | Status: AC

## 2017-07-15 NOTE — ED Notes (Signed)
Patient reports that she is a MWF dialysis patient.   Patient reports N/V beginning Saturday night. Patient reports she has been unable to eat since Monday. Patient generalized discomfort and weakness beginning Monday.

## 2017-07-15 NOTE — Discharge Instructions (Signed)
Please take your Tamiflu as prescribed and continue taking Tylenol as needed for fever and muscle aches.  It is normal to be sick for a full 6-7 days with influenza and take up to another week to fully recover.  Return to the emergency department for any concerns.  It was a pleasure to take care of you today, and thank you for coming to our emergency department.  If you have any questions or concerns before leaving please ask the nurse to grab me and I'm more than happy to go through your aftercare instructions again.  If you were prescribed any opioid pain medication today such as Norco, Vicodin, Percocet, morphine, hydrocodone, or oxycodone please make sure you do not drive when you are taking this medication as it can alter your ability to drive safely.  If you have any concerns once you are home that you are not improving or are in fact getting worse before you can make it to your follow-up appointment, please do not hesitate to call 911 and come back for further evaluation.  Darel Hong, MD  Results for orders placed or performed during the hospital encounter of 07/15/17  Influenza panel by PCR (type A & B)  Result Value Ref Range   Influenza A By PCR POSITIVE (A) NEGATIVE   Influenza B By PCR NEGATIVE NEGATIVE  CBC  Result Value Ref Range   WBC 6.6 3.6 - 11.0 K/uL   RBC 2.77 (L) 3.80 - 5.20 MIL/uL   Hemoglobin 9.4 (L) 12.0 - 16.0 g/dL   HCT 27.0 (L) 35.0 - 47.0 %   MCV 97.7 80.0 - 100.0 fL   MCH 34.1 (H) 26.0 - 34.0 pg   MCHC 34.9 32.0 - 36.0 g/dL   RDW 14.6 (H) 11.5 - 14.5 %   Platelets 114 (L) 150 - 440 K/uL  Comprehensive metabolic panel  Result Value Ref Range   Sodium 137 135 - 145 mmol/L   Potassium 3.3 (L) 3.5 - 5.1 mmol/L   Chloride 95 (L) 101 - 111 mmol/L   CO2 29 22 - 32 mmol/L   Glucose, Bld 131 (H) 65 - 99 mg/dL   BUN 15 6 - 20 mg/dL   Creatinine, Ser 5.29 (H) 0.44 - 1.00 mg/dL   Calcium 8.7 (L) 8.9 - 10.3 mg/dL   Total Protein 6.8 6.5 - 8.1 g/dL   Albumin  3.7 3.5 - 5.0 g/dL   AST 35 15 - 41 U/L   ALT 26 14 - 54 U/L   Alkaline Phosphatase 89 38 - 126 U/L   Total Bilirubin 0.8 0.3 - 1.2 mg/dL   GFR calc non Af Amer 8 (L) >60 mL/min   GFR calc Af Amer 9 (L) >60 mL/min   Anion gap 13 5 - 15  Lactic acid, plasma  Result Value Ref Range   Lactic Acid, Venous 0.8 0.5 - 1.9 mmol/L   Dg Chest 2 View  Result Date: 07/15/2017 CLINICAL DATA:  Fever.  Cough. EXAM: CHEST  2 VIEW COMPARISON:  March 11, 2017 FINDINGS: The heart size is borderline. The hila and mediastinum are normal. No pneumothorax. No nodules or masses. No focal infiltrates. Mild increase lung markings bilaterally are identified. IMPRESSION: Mild increase lung markings bilaterally could represent pulmonary venous congestion, bronchitic change, or atypical infection. No focal infiltrate identified. Electronically Signed   By: Dorise Bullion III M.D   On: 07/15/2017 20:20   US Thyroid  Result Date: 06/16/2017 CLINICAL DATA:  Other.  Thyroid nodule. EXAM:  THYROID ULTRASOUND TECHNIQUE: Ultrasound examination of the thyroid gland and adjacent soft tissues was performed. COMPARISON:  None. FINDINGS: Parenchymal Echotexture: Mildly heterogenous Isthmus: 0.3 cm Right lobe: 4.9 x 1.5 x 1.7 cm Left lobe: 5.0 x 1.8 x 2.0 cm _________________________________________________________ Estimated total number of nodules >/= 1 cm: 3 Number of spongiform nodules >/=  2 cm not described below (TR1): 0 Number of mixed cystic and solid nodules >/= 1.5 cm not described below (TR2): 0 _________________________________________________________ Nodule # 1: Location: Isthmus; Mid Maximum size: 1.0 cm; Other 2 dimensions: 0.8 x 0.5 cm Composition: mixed cystic and solid (1) Echogenicity: isoechoic (1) Shape: not taller-than-wide (0) Margins: smooth (0) Echogenic foci: none (0) ACR TI-RADS total points: 2. ACR TI-RADS risk category: TR2 (2 points). ACR TI-RADS recommendations: This nodule does NOT meet TI-RADS criteria for  biopsy or dedicated follow-up. _________________________________________________________ Nodule # 5: Location: Left; Mid Maximum size: 2.1 cm; Other 2 dimensions: 1.7 x 1.2 cm Composition: solid/almost completely solid (2) Echogenicity: isoechoic (1) Shape: not taller-than-wide (0) Margins: smooth (0) Echogenic foci: none (0) ACR TI-RADS total points: 3. ACR TI-RADS risk category: TR3 (3 points). ACR TI-RADS recommendations: *Given size (>/= 1.5 - 2.4 cm) and appearance, a follow-up ultrasound in 1 year should be considered based on TI-RADS criteria. _________________________________________________________ Multiple small nodules and cysts bilaterally do not meet criteria for fine needle aspiration biopsy or follow-up. 1.1 x 1.1 x 0.4 cm hypoechoic nodule 2 is posterior to the upper pole of the right lobe. It is outside of the thyroid gland and adjacent to it and may represent a parathyroid abnormality. IMPRESSION: Left mid nodule 5 meets criteria for annual follow-up. Several nodules and cysts in the gland do not meet criteria for follow-up nor biopsy. There is a 1.1 cm nodule posterior to the upper pole of the right lobe of the thyroid gland which may be a parathyroid mass. Correlate clinically as for the need for laboratory analysis. The above is in keeping with the ACR TI-RADS recommendations - J Am Coll Radiol 2017;14:587-595. Electronically Signed   By: Marybelle Killings M.D.   On: 06/16/2017 16:23

## 2017-07-15 NOTE — ED Provider Notes (Signed)
[facility] Emergency Department Provider Note  ____________________________________________   First MD Initiated Contact with Patient 07/15/17 2050     (approximate)  I have reviewed the triage vital signs and the nursing notes.   HISTORY  Chief Complaint Fever   HPI Theresa [name] is a 62 y.o. female who presents to the emergency department with roughly 5 days of malaise low-grade fever dry cough rhinorrhea congestion and muscle aches.  She has a past medical history of end-stage renal disease and is dialyzed Monday Wednesday Friday.  Was most recently dialyzed today.  She also has a history of COPD and is oxygen dependent at home.  She did get a flu shot this year.  She has no sick contacts.  Her symptoms have been insidious onset and constant.  Nothing seems to make them better or worse.  Past Medical History:  Diagnosis Date  . Anemia of chronic disease   . Carotid arterial disease ([place])    a. 02/2013 U/S: 40-59% bilat ICA stenosis - *f/u 02/2014*  . Chronic constipation   . Chronic diastolic CHF (congestive heart failure) ([place])    a. 10/2013 [facility]): EF 55-60%, mod conc LVH, mod MR, mildly dil LA, mild Ao sclerosis w/o stenosis.  . Colon polyps   . COPD (chronic obstructive pulmonary disease) ([place])   . Coronary artery disease    a. 05/2013 NSTEMI/PCI: LM 20d, LAD min irregs, LCX small, nl, OM1 nl, RCA dom 21m (2.5x16 Promus DES), PDA1 80p.  . Diabetes mellitus   . Diabetic neuropathy ([place])   . Diabetic retinopathy ([place]) 05/28/2013   Hx bilat retinal detachment, proliferative diab retinopathy and bilat vitreous hemorrhage   . Emphysema   . ESRD on hemodialysis ([place])    a. [facility] in [place], [place], on a TTS schedule.  She started dialysis in Feb 2014.  Etiology of renal failure not known, likely diabetes.  Has a left upper arm AV graft.  . History of bronchitis    Mar 2012  . History of pneumonia    June 2012  . History of tobacco abuse    a.  Quit 2012.  Marland Kitchen Hyperlipidemia   . Hypertension   . Moderate mitral insufficiency    a. 10/2013 Echo: EF 55-60%, mod MR.  . Myocardial infarct ([place]) 05/2013  . Peripheral vascular disease ([place])   . Renal insufficiency   . Sickle cell trait [facility])     Patient Active Problem List   Diagnosis Date Noted  . PAD (peripheral artery disease) ([place]) 03/07/2017  . Bilateral carotid artery stenosis 02/17/2017  . Complication of vascular access for dialysis 06/19/2016  . Atypical chest pain 05/21/2016  . Mitral regurgitation 02/06/2016  . Screening for breast cancer 07/27/2015  . Diastolic heart failure ([place]) 05/07/2015  . S/P coronary artery stent placement   . Leg pain 03/05/2015  . Neuritis or radiculitis due to rupture of lumbar intervertebral disc 11/28/2014  . Routine general medical examination at a health care facility 09/02/2013  . Osteoarthritis of right hip 08/23/2013  . DDD (degenerative disc disease), lumbosacral 08/23/2013  . Coronary artery disease of native artery of native heart with stable angina pectoris ([place]) 06/27/2013  . Diabetic retinopathy ([place]) 05/28/2013  . Hyperlipidemia   . Syncope 01/21/2013  . Essential (primary) hypertension 11/08/2012  . Diabetic neuropathy ([place]) 02/18/2012  . Anemia in chronic kidney disease (CKD) 02/18/2012  . ESRD on hemodialysis ([place]) 08/21/2011  . Diabetes mellitus, type II ([place]) 06/30/2011  .  COPD (chronic obstructive pulmonary disease) ([place]) 06/13/2011    Past Surgical History:  Procedure Laterality Date  . ABDOMINAL HYSTERECTOMY     2000  . CARDIAC CATHETERIZATION    . CARDIAC CATHETERIZATION N/A 05/02/2015   Procedure: Left Heart Cath and Coronary Angiography;  Surgeon: [name], MD;  Location: [place] [facility];  Service: Cardiovascular;  Laterality: N/A;  . COLONOSCOPY WITH PROPOFOL N/A 07/08/2016   Procedure: COLONOSCOPY WITH PROPOFOL;  Surgeon: [name], MD;  Location: [facility] ENDOSCOPY;  Service: Endoscopy;   Laterality: N/A;  . colonscopy    . CORONARY ANGIOPLASTY  05/28/2014   stent placement to the mid RCA  . DILATION AND CURETTAGE OF UTERUS     several in the early 80's  . ESOPHAGOGASTRODUODENOSCOPY     2012  . EYE SURGERY     bilateral laser 2012  . EYE SURGERY     right  . GAS INSERTION  09/30/2011   Procedure: INSERTION OF GAS;  Surgeon: [name], MD;  Location: [place];  Service: Ophthalmology;  Laterality: Right;  C3F8  . GAS/FLUID EXCHANGE  09/30/2011   Procedure: GAS/FLUID EXCHANGE;  Surgeon: [name], MD;  Location: [place];  Service: Ophthalmology;  Laterality: Right;  . LEFT HEART CATHETERIZATION WITH CORONARY ANGIOGRAM N/A 05/28/2013   Procedure: LEFT HEART CATHETERIZATION WITH CORONARY ANGIOGRAM;  Surgeon: [name], MD;  Location: [facility] CATH LAB;  Service: Cardiovascular;  Laterality: N/A;  . PARS PLANA VITRECTOMY  04/22/2011   Procedure: PARS PLANA VITRECTOMY WITH 25 GAUGE;  Surgeon: [name], MD;  Location: [place];  Service: Ophthalmology;  Laterality: Left;  membrane peel, endolaser, gas fluid exchange, silicone oil, repair of complex traction retinal detachment  . PARS PLANA VITRECTOMY  09/30/2011   Procedure: PARS PLANA VITRECTOMY WITH 25 GAUGE;  Surgeon: [name], MD;  Location: [place];  Service: Ophthalmology;  Laterality: Right;  Endolaser; Repair of Complex Traction Retinal Detachment  . PARS PLANA VITRECTOMY  02/24/2012   Procedure: PARS PLANA VITRECTOMY WITH 25 GAUGE;  Surgeon: [name], MD;  Location: [place];  Service: Ophthalmology;  Laterality: Left;  . PTCA    . SILICON OIL REMOVAL  1/61/0960   Procedure: SILICON OIL REMOVAL;  Surgeon: [name], MD;  Location: [place];  Service: Ophthalmology;  Laterality: Left;  . THROMBECTOMY / ARTERIOVENOUS GRAFT REVISION    . TUBAL LIGATION     1979    Prior to Admission medications   Medication Sig Start Date End Date Taking? Authorizing Provider  albuterol (PROVENTIL HFA;VENTOLIN  HFA) 108 (90 BASE) MCG/ACT inhaler Inhale 2 puffs into the lungs every 6 (six) hours as needed for wheezing or shortness of breath.    [provider]  albuterol (PROVENTIL) (2.5 MG/3ML) 0.083% nebulizer solution Take 2.5 mg by nebulization every 4 (four) hours as needed for wheezing or shortness of breath.    [provider]  amLODipine (NORVASC) 10 MG tablet Take 10 mg by mouth daily.  09/17/16   [provider]  aspirin EC 81 MG tablet Take 81 mg by mouth daily.    [provider]  atorvastatin (LIPITOR) 40 MG tablet Take 1 tablet (40 mg total) by mouth daily. Patient taking differently: Take 40 mg by mouth daily at 6 PM.  07/03/16 03/11/18  Alisa Graff, FNP  carvedilol (COREG) 12.5 MG tablet IF BLOOD PRESSURE GREATER THAN 160, TAKE 2 TABLETS BY MOUTH TWICE DAILY, OTHERWISE TAKE 1 TABLET  BY MOUTH TWICE DAILY. 03/02/17   Alisa Graff, FNP  cinacalcet (SENSIPAR) 30 MG tablet Take 30 mg by mouth daily.    [provider]  cloNIDine (CATAPRES) 0.1 MG tablet Take 0.1 mg by mouth 2 (two) times daily.    [provider]  furosemide (LASIX) 80 MG tablet Take 80 mg by mouth daily.     [provider]  isosorbide mononitrate (IMDUR) 60 MG 24 hr tablet Take 1 tablet (60 mg total) by mouth daily. 07/03/16 03/05/17  Alisa Graff, FNP  losartan (COZAAR) 50 MG tablet Take 50 mg by mouth daily.  12/16/16   [provider]  Melatonin 10 MG TABS Take 10 mg by mouth daily as needed (SLEEP).     [provider]  multivitamin (RENA-VIT) TABS tablet TAKE ONE TABLET BY MOUTH ONCE DAILY 11/09/16   Darylene Price A, FNP  naproxen sodium (ANAPROX) 220 MG tablet Take 220 mg by mouth daily as needed.    [provider]  nitroGLYCERIN (NITROSTAT) 0.4 MG SL tablet Place 1 tablet (0.4 mg total) under the tongue every 5 (five) minutes as needed for chest pain. 01/15/17   Burnard Hawthorne, FNP  oseltamivir (TAMIFLU) 30 MG capsule Take 1  capsule (30 mg total) by mouth 2 (two) times daily for 5 days. 07/15/17 07/20/17  Darel Hong, MD  pregabalin (LYRICA) 75 MG capsule Take 1 capsule (75 mg total) by mouth daily. 04/21/16   Burnard Hawthorne, FNP    Allergies Hydrocodone; Metformin; and Lisinopril  Family History  Problem Relation Age of Onset  . Stroke Mother   . Heart attack Mother   . Heart disease Mother   . Colon cancer Father   . Colon cancer Sister   . Heart attack Brother   . Stroke Brother   . Diabetes Brother   . Breast cancer Sister   . Diabetes Brother   . Diabetes Daughter   . Anesthesia problems Neg Hx   . Hypotension Neg Hx   . Malignant hyperthermia Neg Hx   . Pseudochol deficiency Neg Hx     Social History Social History   Tobacco Use  . Smoking status: Former Smoker    Packs/day: 1.00    Years: 25.00    Pack years: 25.00    Last attempt to quit: 06/09/2010    Years since quitting: 7.1  . Smokeless tobacco: Never Used  Substance Use Topics  . Alcohol use: No  . Drug use: No    Review of Systems Constitutional: Positive for fevers Eyes: No visual changes. ENT: No sore throat. Cardiovascular: Positive for chest pain. Respiratory: Positive for shortness of breath. Gastrointestinal: No abdominal pain.  Positive for nausea, no vomiting.  No diarrhea.  No constipation. Genitourinary: Negative for dysuria. Musculoskeletal: Negative for back pain. Skin: Negative for rash. Neurological: Negative for headaches, focal weakness or numbness.   ____________________________________________   PHYSICAL EXAM:  VITAL SIGNS: ED Triage Vitals  Enc Vitals Group     BP 07/15/17 1952 (!) 159/59     Pulse Rate 07/15/17 1952 78     Resp 07/15/17 1952 20     Temp 07/15/17 1952 100.2 F (37.9 C)     Temp Source 07/15/17 1952 Oral     SpO2 07/15/17 1952 93 %     Weight 07/15/17 1949 174 lb (78.9 kg)     Height 07/15/17 1949 5\' 7"  (1.702 m)     Head Circumference --  Peak Flow --       Pain Score --      Pain Loc --      Pain Edu? --      Excl. in [place]? --     Constitutional: Alert and oriented x4 appears somewhat uncomfortable with congested sounding voice Eyes: PERRL EOMI. Head: Atraumatic. Nose: Positive for congestion Mouth/Throat: No trismus uvula midline no pharyngeal erythema or exudate Neck: No stridor.   Cardiovascular: Normal rate, regular rhythm. Grossly normal heart sounds.  Good peripheral circulation. Respiratory: Normal respiratory effort.  No retractions. Lungs CTAB and moving good air Gastrointestinal: Soft nontender Musculoskeletal: Fistula in place to left upper extremity Neurologic:  Normal speech and language. No gross focal neurologic deficits are appreciated. Skin:  Skin is warm, dry and intact. No rash noted. Psychiatric: Mood and affect are normal. Speech and behavior are normal.    ____________________________________________   DIFFERENTIAL includes but not limited to  Influenza, pneumonia, pneumothorax, dehydration, viral syndrome ____________________________________________   LABS (all labs ordered are listed, but only abnormal results are displayed)  Labs Reviewed  INFLUENZA PANEL BY PCR (TYPE A & B) - Abnormal; Notable for the following components:      Result Value   Influenza A By PCR POSITIVE (*)    All other components within normal limits  CBC - Abnormal; Notable for the following components:   RBC 2.77 (*)    Hemoglobin 9.4 (*)    HCT 27.0 (*)    MCH 34.1 (*)    RDW 14.6 (*)    Platelets 114 (*)    All other components within normal limits  COMPREHENSIVE METABOLIC PANEL - Abnormal; Notable for the following components:   Potassium 3.3 (*)    Chloride 95 (*)    Glucose, Bld 131 (*)    Creatinine, Ser 5.29 (*)    Calcium 8.7 (*)    GFR calc non Af Amer 8 (*)    GFR calc Af Amer 9 (*)    All other components within normal limits  LACTIC ACID, PLASMA  LACTIC ACID, PLASMA    Lab work reviewed by me consistent  with influenza A __________________________________________  EKG    ____________________________________________  RADIOLOGY  Chest x-ray reviewed by me with no infiltrate but is consistent with atypical infection ____________________________________________   PROCEDURES  Procedure(s) performed: no  Procedures  Critical Care performed: no  Observation: no ____________________________________________   INITIAL IMPRESSION / ASSESSMENT AND PLAN / ED COURSE  Pertinent labs & imaging results that were available during my care of the patient were reviewed by me and considered in my medical decision making (see chart for details).  By the time I saw the patient she had already had blood work and a chest x-ray performed.  Her x-ray has no infiltrate.  Her lab results are consistent with.  While she is already 5 days out from the initiation of her symptoms she is a high risk patient so we will give her a first dose of Tamiflu now as well as a 5-day course for home.  Strict return precautions have been given and the patient verbalized understanding and agreement the plan.      ____________________________________________   FINAL CLINICAL IMPRESSION(S) / ED DIAGNOSES  Final diagnoses:  Influenza A      NEW MEDICATIONS STARTED DURING THIS VISIT:  New Prescriptions   OSELTAMIVIR (TAMIFLU) 30 MG CAPSULE    Take 1 capsule (30 mg total) by mouth 2 (two) times daily for 5 days.  Note:  This document was prepared using Dragon voice recognition software and may include unintentional dictation errors.     Darel Hong, MD 07/15/17 2139

## 2017-07-15 NOTE — ED Triage Notes (Addendum)
Pt to triage via w/c with no distress noted, mask in place with O2, brought in by EMS; st fever last several days with nausea with prod cough yellow sputum; st normally on O2 at 2l/min via [place] at home for COPD

## 2017-07-15 NOTE — ED Notes (Signed)
Called patient's son per patient request for transport home

## 2017-07-15 NOTE — ED Notes (Signed)
Reviewed discharge instructions, follow-up care, and prescriptions with patient. Patient verbalized understanding of all information reviewed. Patient stable, with no distress noted at this time.    

## 2017-07-16 DIAGNOSIS — Z7682 Awaiting organ transplant status: Principal | ICD-10-CM

## 2017-07-16 DIAGNOSIS — Z01818 Encounter for other preprocedural examination: Secondary | ICD-10-CM

## 2017-07-16 DIAGNOSIS — N186 End stage renal disease: Secondary | ICD-10-CM

## 2017-07-18 DIAGNOSIS — D509 Iron deficiency anemia, unspecified: Secondary | ICD-10-CM | POA: Diagnosis not present

## 2017-07-18 DIAGNOSIS — D631 Anemia in chronic kidney disease: Secondary | ICD-10-CM | POA: Diagnosis not present

## 2017-07-18 DIAGNOSIS — Z992 Dependence on renal dialysis: Secondary | ICD-10-CM | POA: Diagnosis not present

## 2017-07-18 DIAGNOSIS — N186 End stage renal disease: Secondary | ICD-10-CM | POA: Diagnosis not present

## 2017-07-18 DIAGNOSIS — N2581 Secondary hyperparathyroidism of renal origin: Secondary | ICD-10-CM | POA: Diagnosis not present

## 2017-07-20 DIAGNOSIS — D631 Anemia in chronic kidney disease: Secondary | ICD-10-CM | POA: Diagnosis not present

## 2017-07-20 DIAGNOSIS — N186 End stage renal disease: Secondary | ICD-10-CM | POA: Diagnosis not present

## 2017-07-20 DIAGNOSIS — D509 Iron deficiency anemia, unspecified: Secondary | ICD-10-CM | POA: Diagnosis not present

## 2017-07-20 DIAGNOSIS — Z992 Dependence on renal dialysis: Secondary | ICD-10-CM | POA: Diagnosis not present

## 2017-07-20 DIAGNOSIS — N2581 Secondary hyperparathyroidism of renal origin: Secondary | ICD-10-CM | POA: Diagnosis not present

## 2017-07-23 ENCOUNTER — Ambulatory Visit: Payer: Medicare Other | Admitting: Podiatry

## 2017-07-23 ENCOUNTER — Ambulatory Visit (INDEPENDENT_AMBULATORY_CARE_PROVIDER_SITE_OTHER): Payer: Medicare Other | Admitting: Vascular Surgery

## 2017-07-23 ENCOUNTER — Encounter (INDEPENDENT_AMBULATORY_CARE_PROVIDER_SITE_OTHER): Payer: Medicare Other

## 2017-07-24 DIAGNOSIS — N186 End stage renal disease: Secondary | ICD-10-CM | POA: Diagnosis not present

## 2017-07-24 DIAGNOSIS — Z992 Dependence on renal dialysis: Secondary | ICD-10-CM | POA: Diagnosis not present

## 2017-07-24 DIAGNOSIS — D509 Iron deficiency anemia, unspecified: Secondary | ICD-10-CM | POA: Diagnosis not present

## 2017-07-24 DIAGNOSIS — N2581 Secondary hyperparathyroidism of renal origin: Secondary | ICD-10-CM | POA: Diagnosis not present

## 2017-07-24 DIAGNOSIS — D631 Anemia in chronic kidney disease: Secondary | ICD-10-CM | POA: Diagnosis not present

## 2017-07-26 ENCOUNTER — Emergency Department: Payer: Medicare Other

## 2017-07-26 ENCOUNTER — Other Ambulatory Visit: Payer: Self-pay

## 2017-07-26 ENCOUNTER — Inpatient Hospital Stay
Admission: EM | Admit: 2017-07-26 | Discharge: 2017-07-28 | DRG: 291 | Disposition: A | Discharge status: Home / Self Care | Payer: Medicare Other | Attending: Internal Medicine | Admitting: Internal Medicine

## 2017-07-26 ENCOUNTER — Encounter: Payer: Self-pay | Admitting: *Deleted

## 2017-07-26 DIAGNOSIS — J9621 Acute and chronic respiratory failure with hypoxia: Secondary | ICD-10-CM | POA: Diagnosis present

## 2017-07-26 DIAGNOSIS — I132 Hypertensive heart and chronic kidney disease with heart failure and with stage 5 chronic kidney disease, or end stage renal disease: Secondary | ICD-10-CM | POA: Diagnosis not present

## 2017-07-26 DIAGNOSIS — E1140 Type 2 diabetes mellitus with diabetic neuropathy, unspecified: Secondary | ICD-10-CM | POA: Diagnosis present

## 2017-07-26 DIAGNOSIS — I252 Old myocardial infarction: Secondary | ICD-10-CM

## 2017-07-26 DIAGNOSIS — Z885 Allergy status to narcotic agent status: Secondary | ICD-10-CM | POA: Diagnosis not present

## 2017-07-26 DIAGNOSIS — N2581 Secondary hyperparathyroidism of renal origin: Secondary | ICD-10-CM | POA: Diagnosis present

## 2017-07-26 DIAGNOSIS — Z79899 Other long term (current) drug therapy: Secondary | ICD-10-CM

## 2017-07-26 DIAGNOSIS — J449 Chronic obstructive pulmonary disease, unspecified: Secondary | ICD-10-CM | POA: Diagnosis present

## 2017-07-26 DIAGNOSIS — E1122 Type 2 diabetes mellitus with diabetic chronic kidney disease: Secondary | ICD-10-CM | POA: Diagnosis present

## 2017-07-26 DIAGNOSIS — D573 Sickle-cell trait: Secondary | ICD-10-CM | POA: Diagnosis present

## 2017-07-26 DIAGNOSIS — I509 Heart failure, unspecified: Secondary | ICD-10-CM | POA: Diagnosis not present

## 2017-07-26 DIAGNOSIS — I5033 Acute on chronic diastolic (congestive) heart failure: Secondary | ICD-10-CM | POA: Diagnosis present

## 2017-07-26 DIAGNOSIS — I2510 Atherosclerotic heart disease of native coronary artery without angina pectoris: Secondary | ICD-10-CM | POA: Diagnosis present

## 2017-07-26 DIAGNOSIS — E1151 Type 2 diabetes mellitus with diabetic peripheral angiopathy without gangrene: Secondary | ICD-10-CM | POA: Diagnosis present

## 2017-07-26 DIAGNOSIS — E785 Hyperlipidemia, unspecified: Secondary | ICD-10-CM | POA: Diagnosis present

## 2017-07-26 DIAGNOSIS — D631 Anemia in chronic kidney disease: Secondary | ICD-10-CM | POA: Diagnosis present

## 2017-07-26 DIAGNOSIS — E113599 Type 2 diabetes mellitus with proliferative diabetic retinopathy without macular edema, unspecified eye: Secondary | ICD-10-CM | POA: Diagnosis present

## 2017-07-26 DIAGNOSIS — Z955 Presence of coronary angioplasty implant and graft: Secondary | ICD-10-CM

## 2017-07-26 DIAGNOSIS — Z888 Allergy status to other drugs, medicaments and biological substances status: Secondary | ICD-10-CM | POA: Diagnosis not present

## 2017-07-26 DIAGNOSIS — N186 End stage renal disease: Secondary | ICD-10-CM | POA: Diagnosis not present

## 2017-07-26 DIAGNOSIS — E119 Type 2 diabetes mellitus without complications: Secondary | ICD-10-CM | POA: Diagnosis not present

## 2017-07-26 DIAGNOSIS — Z87891 Personal history of nicotine dependence: Secondary | ICD-10-CM | POA: Diagnosis not present

## 2017-07-26 DIAGNOSIS — I10 Essential (primary) hypertension: Secondary | ICD-10-CM | POA: Diagnosis not present

## 2017-07-26 DIAGNOSIS — E1121 Type 2 diabetes mellitus with diabetic nephropathy: Secondary | ICD-10-CM | POA: Diagnosis present

## 2017-07-26 DIAGNOSIS — Z992 Dependence on renal dialysis: Secondary | ICD-10-CM | POA: Diagnosis not present

## 2017-07-26 DIAGNOSIS — J9601 Acute respiratory failure with hypoxia: Secondary | ICD-10-CM | POA: Diagnosis not present

## 2017-07-26 LAB — CBC WITH DIFFERENTIAL/PLATELET
Basophils Absolute: 0.1 10*3/uL (ref 0–0.1)
Basophils Relative: 1 %
EOS ABS: 0.2 10*3/uL (ref 0–0.7)
Eosinophils Relative: 2 %
HEMATOCRIT: 23.6 % — AB (ref 35.0–47.0)
HEMOGLOBIN: 7.9 g/dL — AB (ref 12.0–16.0)
LYMPHS ABS: 0.8 10*3/uL — AB (ref 1.0–3.6)
Lymphocytes Relative: 10 %
MCH: 33.8 pg (ref 26.0–34.0)
MCHC: 33.6 g/dL (ref 32.0–36.0)
MCV: 100.7 fL — ABNORMAL HIGH (ref 80.0–100.0)
MONO ABS: 0.4 10*3/uL (ref 0.2–0.9)
Monocytes Relative: 5 %
NEUTROS ABS: 7 10*3/uL — AB (ref 1.4–6.5)
NEUTROS PCT: 82 %
Platelets: 226 10*3/uL (ref 150–440)
RBC: 2.34 MIL/uL — ABNORMAL LOW (ref 3.80–5.20)
RDW: 16.7 % — ABNORMAL HIGH (ref 11.5–14.5)
WBC: 8.5 10*3/uL (ref 3.6–11.0)

## 2017-07-26 LAB — COMPREHENSIVE METABOLIC PANEL
ALK PHOS: 86 U/L (ref 38–126)
ALT: 13 U/L — ABNORMAL LOW (ref 14–54)
ANION GAP: 10 (ref 5–15)
AST: 17 U/L (ref 15–41)
Albumin: 3.2 g/dL — ABNORMAL LOW (ref 3.5–5.0)
BILIRUBIN TOTAL: 0.7 mg/dL (ref 0.3–1.2)
BUN: 30 mg/dL — ABNORMAL HIGH (ref 6–20)
CO2: 29 mmol/L (ref 22–32)
CREATININE: 10.08 mg/dL — AB (ref 0.44–1.00)
Calcium: 9.2 mg/dL (ref 8.9–10.3)
Chloride: 101 mmol/L (ref 101–111)
GFR calc non Af Amer: 4 mL/min — ABNORMAL LOW (ref >60)
GFR, EST AFRICAN AMERICAN: 4 mL/min — AB (ref >60)
Glucose, Bld: 131 mg/dL — ABNORMAL HIGH (ref 65–99)
Potassium: 3.6 mmol/L (ref 3.5–5.1)
Sodium: 140 mmol/L (ref 135–145)
Total Protein: 6.4 g/dL — ABNORMAL LOW (ref 6.5–8.1)

## 2017-07-26 LAB — GLUCOSE, CAPILLARY: Glucose-Capillary: 111 mg/dL — ABNORMAL HIGH (ref 65–99)

## 2017-07-26 LAB — BRAIN NATRIURETIC PEPTIDE: B Natriuretic Peptide: 1436 pg/mL — ABNORMAL HIGH (ref 0.0–100.0)

## 2017-07-26 LAB — TROPONIN I: Troponin I: 0.06 ng/mL (ref <0.03)

## 2017-07-26 MED ORDER — HYDRALAZINE HCL 20 MG/ML IJ SOLN
10.0000 mg | INTRAMUSCULAR | Status: DC | PRN
  Administered 2017-07-26 – 2017-07-27 (×2): 10 mg via INTRAVENOUS
  Filled 2017-07-26 (×2): qty 1

## 2017-07-26 MED ORDER — INSULIN ASPART 100 UNIT/ML ~~LOC~~ SOLN: 0.0000 [IU] | Freq: Three times a day (TID) | SUBCUTANEOUS | Status: DC

## 2017-07-26 MED ORDER — LIDOCAINE HCL (PF) 1 % IJ SOLN
5.0000 mL | Freq: Once | INTRAMUSCULAR | Status: AC
  Administered 2017-07-26: 5 mL via INTRADERMAL

## 2017-07-26 MED ORDER — LABETALOL HCL 5 MG/ML IV SOLN
10.0000 mg | Freq: Once | INTRAVENOUS | Status: AC
  Administered 2017-07-26: 10 mg via INTRAVENOUS
  Filled 2017-07-26: qty 4

## 2017-07-26 MED ORDER — SODIUM CHLORIDE 0.9% FLUSH: 3.0000 mL | INTRAVENOUS | Status: DC | PRN

## 2017-07-26 MED ORDER — IPRATROPIUM-ALBUTEROL 0.5-2.5 (3) MG/3ML IN SOLN: 3.0000 mL | RESPIRATORY_TRACT | Status: DC | PRN

## 2017-07-26 MED ORDER — ONDANSETRON HCL 4 MG PO TABS: 4.0000 mg | ORAL_TABLET | Freq: Four times a day (QID) | ORAL | Status: DC | PRN

## 2017-07-26 MED ORDER — ACETAMINOPHEN 325 MG PO TABS: 650.0000 mg | ORAL_TABLET | Freq: Four times a day (QID) | ORAL | Status: DC | PRN

## 2017-07-26 MED ORDER — OXYCODONE HCL 5 MG PO TABS
5.0000 mg | ORAL_TABLET | ORAL | Status: DC | PRN
  Administered 2017-07-27 (×3): 5 mg via ORAL
  Filled 2017-07-26 (×4): qty 1

## 2017-07-26 MED ORDER — LABETALOL HCL 5 MG/ML IV SOLN
20.0000 mg | Freq: Once | INTRAVENOUS | Status: AC
  Administered 2017-07-26: 20 mg via INTRAVENOUS
  Filled 2017-07-26: qty 4

## 2017-07-26 MED ORDER — SODIUM CHLORIDE 0.9 % IV SOLN: 250.0000 mL | INTRAVENOUS | Status: DC | PRN

## 2017-07-26 MED ORDER — ALBUTEROL SULFATE (2.5 MG/3ML) 0.083% IN NEBU
5.0000 mg | INHALATION_SOLUTION | Freq: Once | RESPIRATORY_TRACT | Status: AC
  Administered 2017-07-26: 5 mg via RESPIRATORY_TRACT
  Filled 2017-07-26: qty 6

## 2017-07-26 MED ORDER — LIDOCAINE HCL (PF) 1 % IJ SOLN
INTRAMUSCULAR | Status: AC
  Filled 2017-07-26: qty 5

## 2017-07-26 MED ORDER — DOCUSATE SODIUM 100 MG PO CAPS
100.0000 mg | ORAL_CAPSULE | Freq: Two times a day (BID) | ORAL | Status: DC
  Administered 2017-07-26 – 2017-07-28 (×3): 100 mg via ORAL
  Filled 2017-07-26 (×3): qty 1

## 2017-07-26 MED ORDER — ONDANSETRON HCL 4 MG/2ML IJ SOLN
4.0000 mg | Freq: Four times a day (QID) | INTRAMUSCULAR | Status: DC | PRN
  Administered 2017-07-27 (×2): 4 mg via INTRAVENOUS
  Filled 2017-07-26 (×2): qty 2

## 2017-07-26 MED ORDER — INSULIN ASPART 100 UNIT/ML ~~LOC~~ SOLN: 0.0000 [IU] | Freq: Every day | SUBCUTANEOUS | Status: DC

## 2017-07-26 MED ORDER — FUROSEMIDE 10 MG/ML IJ SOLN
60.0000 mg | Freq: Once | INTRAMUSCULAR | Status: AC
  Administered 2017-07-26: 60 mg via INTRAVENOUS
  Filled 2017-07-26: qty 8

## 2017-07-26 MED ORDER — INSULIN ASPART 100 UNIT/ML ~~LOC~~ SOLN
0.0000 [IU] | Freq: Three times a day (TID) | SUBCUTANEOUS | Status: DC
  Administered 2017-07-27: 3 [IU] via SUBCUTANEOUS
  Filled 2017-07-26: qty 1

## 2017-07-26 MED ORDER — INSULIN ASPART 100 UNIT/ML ~~LOC~~ SOLN
0.0000 [IU] | Freq: Every day | SUBCUTANEOUS | Status: DC
Start: 2017-07-26 — End: 2017-07-26

## 2017-07-26 MED ORDER — HEPARIN SODIUM (PORCINE) 5000 UNIT/ML IJ SOLN
5000.0000 [IU] | Freq: Three times a day (TID) | INTRAMUSCULAR | Status: DC
  Administered 2017-07-26 – 2017-07-28 (×4): 5000 [IU] via SUBCUTANEOUS
  Filled 2017-07-26 (×5): qty 1

## 2017-07-26 MED ORDER — ACETAMINOPHEN 650 MG RE SUPP: 650.0000 mg | Freq: Four times a day (QID) | RECTAL | Status: DC | PRN

## 2017-07-26 MED ORDER — SODIUM CHLORIDE 0.9% FLUSH
3.0000 mL | Freq: Two times a day (BID) | INTRAVENOUS | Status: DC
  Administered 2017-07-26 – 2017-07-27 (×2): 3 mL via INTRAVENOUS

## 2017-07-26 MED ORDER — POLYETHYLENE GLYCOL 3350 17 G PO PACK: 17.0000 g | PACK | Freq: Every day | ORAL | Status: DC | PRN

## 2017-07-26 NOTE — ED Notes (Signed)
Pt desated to 86% on RA. Asked pt if she has ever had oxygen before. States she sometimes uses it. Placed on 2 L nasal cannula, will continue to monitor.

## 2017-07-26 NOTE — ED Notes (Signed)
Attempted to obtain IV access and blood twice. Unsuccessful both attempts.

## 2017-07-26 NOTE — ED Notes (Signed)
Informed RN that patient has been roomed and is ready for evaluation.  Patient in NAD at this time and call bell placed within reach.   

## 2017-07-26 NOTE — ED Triage Notes (Signed)
Pt c/o shortness of breath, dx'd w/ flu and finished tamiflu. Pt states she feels as if she is getting worse. Pt c/o continued fever, n/v at home. Pt states @ 1130, temp 102. Pt took tylenol 650 tylenol at that time. Pt states n/v/d last episodes of vomiting and diarrhea were this morning. Pt denies eating and drinking since this morning.

## 2017-07-26 NOTE — ED Notes (Signed)
Date and time results received: 07/26/17 2004   Test: troponin Critical Value: 0.06  Name of Provider Notified: Dr. Quentin Cornwall

## 2017-07-26 NOTE — H&P (Signed)
[facility] NAME: Theresa [name]    MR#:  [id number]  DATE OF BIRTH:  [birth year]  DATE OF ADMISSION:  07/26/2017  PRIMARY CARE PHYSICIAN: Burnard Hawthorne, FNP   REQUESTING/REFERRING PHYSICIAN:   CHIEF COMPLAINT:   Chief Complaint  Patient presents with  . Shortness of Breath    HISTORY OF PRESENT ILLNESS: Theresa [name]  is a 62 y.o. female with a known history per below, recent influenza treated with course of Tamiflu, presenting with worsening shortness of breath, fevers, nausea, emesis, diarrhea last episode was earlier this morning, fever of 102 at home, in the emergency room patient was found to have acute fluid overload state/congestive heart failure, stable on O2 via nasal cannula, patient evaluated the bedside, no apparent distress, patient has good appetite, would like to eat, patient is now being admitted for acute on chronic diastolic congestive heart failure exacerbation due to acute fluid overload state from end-stage renal disease.  PAST MEDICAL HISTORY:   Past Medical History:  Diagnosis Date  . Anemia of chronic disease   . Carotid arterial disease ([place])    a. 02/2013 U/S: 40-59% bilat ICA stenosis - *f/u 02/2014*  . Chronic constipation   . Chronic diastolic CHF (congestive heart failure) ([place])    a. 10/2013 Echo [facility]): EF 55-60%, mod conc LVH, mod MR, mildly dil LA, mild Ao sclerosis w/o stenosis.  . Colon polyps   . COPD (chronic obstructive pulmonary disease) ([place])   . Coronary artery disease    a. 05/2013 NSTEMI/PCI: LM 20d, LAD min irregs, LCX small, nl, OM1 nl, RCA dom 61m (2.5x16 Promus DES), PDA1 80p.  . Diabetes mellitus   . Diabetic neuropathy ([place])   . Diabetic retinopathy ([place]) 05/28/2013   Hx bilat retinal detachment, proliferative diab retinopathy and bilat vitreous hemorrhage   . Emphysema   . ESRD on hemodialysis ([place])    a. [facility] in [place], [place], on a TTS schedule.  She started dialysis in Feb  2014.  Etiology of renal failure not known, likely diabetes.  Has a left upper arm AV graft.  . History of bronchitis    Mar 2012  . History of pneumonia    June 2012  . History of tobacco abuse    a. Quit 2012.  Marland Kitchen Hyperlipidemia   . Hypertension   . Moderate mitral insufficiency    a. 10/2013 Echo: EF 55-60%, mod MR.  . Myocardial infarct ([place]) 05/2013  . Peripheral vascular disease ([place])   . Renal insufficiency   . Sickle cell trait ([place])     PAST SURGICAL HISTORY:  Past Surgical History:  Procedure Laterality Date  . ABDOMINAL HYSTERECTOMY     2000  . CARDIAC CATHETERIZATION    . CARDIAC CATHETERIZATION N/A 05/02/2015   Procedure: Left Heart Cath and Coronary Angiography;  Surgeon: [name], MD;  Location: [place] CV [facility];  Service: Cardiovascular;  Laterality: N/A;  . COLONOSCOPY WITH PROPOFOL N/A 07/08/2016   Procedure: COLONOSCOPY WITH PROPOFOL;  Surgeon: [name], MD;  Location: [facility] ENDOSCOPY;  Service: Endoscopy;  Laterality: N/A;  . colonscopy    . CORONARY ANGIOPLASTY  05/28/2014   stent placement to the mid RCA  . DILATION AND CURETTAGE OF UTERUS     several in the early 80's  . ESOPHAGOGASTRODUODENOSCOPY     2012  . EYE SURGERY     bilateral laser 2012  . EYE SURGERY     right  .  GAS INSERTION  09/30/2011   Procedure: INSERTION OF GAS;  Surgeon: [name], MD;  Location: [place];  Service: Ophthalmology;  Laterality: Right;  C3F8  . GAS/FLUID EXCHANGE  09/30/2011   Procedure: GAS/FLUID EXCHANGE;  Surgeon: [name], MD;  Location: [place];  Service: Ophthalmology;  Laterality: Right;  . LEFT HEART CATHETERIZATION WITH CORONARY ANGIOGRAM N/A 05/28/2013   Procedure: LEFT HEART CATHETERIZATION WITH CORONARY ANGIOGRAM;  Surgeon: [name], MD;  Location: [facility] CATH LAB;  Service: Cardiovascular;  Laterality: N/A;  . PARS PLANA VITRECTOMY  04/22/2011   Procedure: PARS PLANA VITRECTOMY WITH 25 GAUGE;  Surgeon: [name], MD;   Location: [place];  Service: Ophthalmology;  Laterality: Left;  membrane peel, endolaser, gas fluid exchange, silicone oil, repair of complex traction retinal detachment  . PARS PLANA VITRECTOMY  09/30/2011   Procedure: PARS PLANA VITRECTOMY WITH 25 GAUGE;  Surgeon: [name], MD;  Location: [place];  Service: Ophthalmology;  Laterality: Right;  Endolaser; Repair of Complex Traction Retinal Detachment  . PARS PLANA VITRECTOMY  02/24/2012   Procedure: PARS PLANA VITRECTOMY WITH 25 GAUGE;  Surgeon: [name], MD;  Location: [place];  Service: Ophthalmology;  Laterality: Left;  . PTCA    . SILICON OIL REMOVAL  8/54/6270   Procedure: SILICON OIL REMOVAL;  Surgeon: [name], MD;  Location: [place];  Service: Ophthalmology;  Laterality: Left;  . THROMBECTOMY / ARTERIOVENOUS GRAFT REVISION    . TUBAL LIGATION     1979    SOCIAL HISTORY:  Social History   Tobacco Use  . Smoking status: Former Smoker    Packs/day: 1.00    Years: 25.00    Pack years: 25.00    Last attempt to quit: 06/09/2010    Years since quitting: 7.1  . Smokeless tobacco: Never Used  Substance Use Topics  . Alcohol use: No    FAMILY HISTORY:  Family History  Problem Relation Age of Onset  . Stroke Mother   . Heart attack Mother   . Heart disease Mother   . Colon cancer Father   . Colon cancer Sister   . Heart attack Brother   . Stroke Brother   . Diabetes Brother   . Breast cancer Sister   . Diabetes Brother   . Diabetes Daughter   . Anesthesia problems Neg Hx   . Hypotension Neg Hx   . Malignant hyperthermia Neg Hx   . Pseudochol deficiency Neg Hx     DRUG ALLERGIES:  Allergies  Allergen Reactions  . Hydrocodone Hives  . Metformin Diarrhea and Other (See Comments)    Other reaction(s): Distress (finding)  . Lisinopril     REVIEW OF SYSTEMS:   CONSTITUTIONAL: + fever, fatigue, and weakness.  EYES: No blurred or double vision.  EARS, NOSE, AND THROAT: No tinnitus or ear pain.   RESPIRATORY:+ cough, shortness of breath, wheezing, no or hemoptysis.  CARDIOVASCULAR: No chest pain, orthopnea, edema.  GASTROINTESTINAL: + nausea, vomiting, diarrhea, no abdominal pain.  GENITOURINARY: No dysuria, hematuria.  ENDOCRINE: No polyuria, nocturia,  HEMATOLOGY: No anemia, easy bruising or bleeding SKIN: No rash or lesion. MUSCULOSKELETAL: No joint pain or arthritis.   NEUROLOGIC: No tingling, numbness, weakness.  PSYCHIATRY: No anxiety or depression.   MEDICATIONS AT HOME:  Prior to Admission medications   Medication Sig Start Date End Date Taking? Authorizing Provider  albuterol (PROVENTIL HFA;VENTOLIN HFA) 108 (90 BASE) MCG/ACT inhaler Inhale 2 puffs into the lungs every  6 (six) hours as needed for wheezing or shortness of breath.    [provider]  albuterol (PROVENTIL) (2.5 MG/3ML) 0.083% nebulizer solution Take 2.5 mg by nebulization every 4 (four) hours as needed for wheezing or shortness of breath.    [provider]  amLODipine (NORVASC) 10 MG tablet Take 10 mg by mouth daily.  09/17/16   [provider]  aspirin EC 81 MG tablet Take 81 mg by mouth daily.    [provider]  atorvastatin (LIPITOR) 40 MG tablet Take 1 tablet (40 mg total) by mouth daily. Patient taking differently: Take 40 mg by mouth daily at 6 PM.  07/03/16 03/11/18  Alisa Graff, FNP  carvedilol (COREG) 12.5 MG tablet IF BLOOD PRESSURE GREATER THAN 160, TAKE 2 TABLETS BY MOUTH TWICE DAILY, OTHERWISE TAKE 1 TABLET BY MOUTH TWICE DAILY. 03/02/17   Alisa Graff, FNP  cinacalcet (SENSIPAR) 30 MG tablet Take 30 mg by mouth daily.    [provider]  cloNIDine (CATAPRES) 0.1 MG tablet Take 0.1 mg by mouth 2 (two) times daily.    [provider]  furosemide (LASIX) 80 MG tablet Take 80 mg by mouth daily.     [provider]  isosorbide mononitrate (IMDUR) 60 MG 24 hr tablet Take 1 tablet (60 mg total) by mouth daily. 07/03/16 03/05/17  Alisa Graff, FNP  losartan (COZAAR) 50 MG tablet Take 50 mg by mouth daily.  12/16/16   [provider]  Melatonin 10 MG TABS Take 10 mg by mouth daily as needed (SLEEP).     [provider]  multivitamin (RENA-VIT) TABS tablet TAKE ONE TABLET BY MOUTH ONCE DAILY 11/09/16   Darylene Price A, FNP  naproxen sodium (ANAPROX) 220 MG tablet Take 220 mg by mouth daily as needed.    [provider]  nitroGLYCERIN (NITROSTAT) 0.4 MG SL tablet Place 1 tablet (0.4 mg total) under the tongue every 5 (five) minutes as needed for chest pain. 01/15/17   Burnard Hawthorne, FNP  pregabalin (LYRICA) 75 MG capsule Take 1 capsule (75 mg total) by mouth daily. 04/21/16   Burnard Hawthorne, FNP      PHYSICAL EXAMINATION:   VITAL SIGNS: Blood pressure (!) 198/62, pulse 69, temperature 98.7 F (37.1 C), temperature source Oral, resp. rate 18, height 5\' 7"  (1.702 m), weight 78.9 kg (174 lb), SpO2 100 %.  GENERAL:  62 y.o.-year-old patient lying in the bed with no acute distress.  Obese EYES: Pupils equal, round, reactive to light and accommodation. No scleral icterus. Extraocular muscles intact.  HEENT: Head atraumatic, normocephalic. Oropharynx and nasopharynx clear.  NECK:  Supple, no jugular venous distention. No thyroid enlargement, no tenderness.  LUNGS: Bilateral rales with diminished breath sounds bilaterally .no use of accessory muscles of respiration.  CARDIOVASCULAR: S1, S2 normal. No murmurs, rubs, or gallops.  ABDOMEN: Soft, nontender, nondistended. Bowel sounds present. No organomegaly or mass.  EXTREMITIES: Bilateral lower extremity edema, cyanosis, or clubbing.  NEUROLOGIC: Cranial nerves II through XII are intact. MAES. Gait not checked.  PSYCHIATRIC: The patient is alert and oriented x 3.  SKIN: No obvious rash, lesion, or ulcer.   LABORATORY PANEL:   CBC Recent Labs  Lab 07/26/17 1815  WBC 8.5  HGB 7.9*  HCT 23.6*  PLT 226  MCV 100.7*  MCH 33.8  MCHC 33.6  RDW  16.7*  LYMPHSABS 0.8*  MONOABS 0.4  EOSABS 0.2  BASOSABS 0.1   ------------------------------------------------------------------------------------------------------------------  Chemistries  Recent Labs  Lab 07/26/17 1815  NA 140  K 3.6  CL 101  CO2 29  GLUCOSE 131*  BUN 30*  CREATININE 10.08*  CALCIUM 9.2  AST 17  ALT 13*  ALKPHOS 86  BILITOT 0.7   ------------------------------------------------------------------------------------------------------------------ estimated creatinine clearance is 6.5 mL/min (A) (by C-G formula based on SCr of 10.08 mg/dL (H)). ------------------------------------------------------------------------------------------------------------------ No results for input(s): TSH, T4TOTAL, T3FREE, THYROIDAB in the last 72 hours.  Invalid input(s): FREET3   Coagulation profile No results for input(s): INR, PROTIME in the last 168 hours. ------------------------------------------------------------------------------------------------------------------- No results for input(s): DDIMER in the last 72 hours. -------------------------------------------------------------------------------------------------------------------  Cardiac Enzymes Recent Labs  Lab 07/26/17 1815  TROPONINI 0.06*   ------------------------------------------------------------------------------------------------------------------ Invalid input(s): POCBNP  ---------------------------------------------------------------------------------------------------------------  Urinalysis    Component Value Date/Time   COLORURINE Yellow 06/26/2012 2030   APPEARANCEUR Hazy 06/26/2012 2030   LABSPEC 1.010 06/26/2012 2030   PHURINE 8.0 06/26/2012 2030   GLUCOSEU 50 mg/dL 06/26/2012 2030   HGBUR Negative 06/26/2012 2030   BILIRUBINUR Negative 06/26/2012 2030   KETONESUR Negative 06/26/2012 2030   PROTEINUR >=500 06/26/2012 2030   NITRITE Negative 06/26/2012 2030   LEUKOCYTESUR  Negative 06/26/2012 2030     RADIOLOGY: Dg Chest 2 View  Result Date: 07/26/2017 CLINICAL DATA:  History of CHF and COPD. EXAM: CHEST  2 VIEW COMPARISON:  07/15/2017. FINDINGS: Heart size appears normal. Aortic atherosclerosis. Small bilateral pleural effusions and diffuse pulmonary vascular congestion identified compatible with CHF. No airspace opacities. IMPRESSION: 1. Mild congestive heart failure. 2.  Aortic Atherosclerosis (ICD10-I70.0). Electronically Signed   By: Kerby Moors M.D.   On: 07/26/2017 16:49    EKG: Orders placed or performed during the hospital encounter of 07/26/17  . EKG 12-Lead  . EKG 12-Lead  . ED EKG  . ED EKG    IMPRESSION AND PLAN: 1 acute on chronic diastolic congestive heart failure exacerbation Secondary to acute fluid overload stage in patient with end-stage renal disease Admit to regular nursing for bed, IV Lasix twice daily, strict I&O monitoring, daily weights, renal/heart healthy diet, nephrology to see for hemodialysis needs Most recent echocardiogram with preserved left ventricular systolic function/ejection fraction  2 acute fluid overload state Secondary to end-stage renal disease Plan of care as stated above  3 chronic end-stage renal disease on hemodialysis Denies missing hemodialysis treatments Plan of care as stated above  4 chronic benign essential hypertension Exacerbated by acute fluid overload state Continue home regiment, IV hydralazine as needed systolic blood pressure greater than 160, vitals per routine, make changes as per necessary  5 acute on chronic hypoxic respiratory failure Supplemental oxygen as needed with weaning as tolerated, patient is on 2 L via nasal cannula as needed at home  6 COPD without exacerbation Stable Breathing treatments as needed  7 chronic diabetes mellitus type 2 Sliding scale insulin with Accu-Cheks per routine   All the records are reviewed and case discussed with ED provider. Management  plans discussed with the patient, family and they are in agreement.  CODE STATUS:full Code Status History    Date Active Date Inactive Code Status Order ID Comments User Context   05/21/2016 20:02 05/24/2016 18:46 Full Code [id number]  Nicholes Mango, MD Inpatient   02/22/2016 19:02 02/23/2016 14:49 Full Code [id number]  Dustin Flock, MD ED   05/07/2015 19:43 05/08/2015 19:08 Full Code [id number]  Bettey Costa, MD Inpatient   05/02/2015 16:48 05/04/2015 20:14 Full Code [id number]  Vaughan Basta, MD Inpatient   05/02/2015 15:31 05/02/2015 16:48 Full  Code [id number]  [name], MD Inpatient   11/23/2014 22:42 11/25/2014 15:29 Full Code [id number]  Demetrios Loll, MD Inpatient       TOTAL TIME TAKING CARE OF THIS PATIENT: 45 minutes.    Avel Peace Harshal Sirmon M.D on 07/26/2017   Between 7am to 6pm - Pager - [phone]  After 6pm go to [email or web address] - password [email or web address] [facility]  Office  [phone]  CC: Primary care physician; Burnard Hawthorne, FNP   Note: This dictation was prepared with Dragon dictation along with smaller phrase technology. Any transcriptional errors that result from this process are unintentional.

## 2017-07-26 NOTE — ED Provider Notes (Signed)
[facility] Emergency Department Provider Note    None    (approximate)  I have reviewed the triage vital signs and the nursing notes.   HISTORY  Chief Complaint Shortness of Breath     HPI Theresa [name] is a 62 y.o. female with a history of CKD on dialysis as well as a history of COPD and congestive heart failure with recent diagnosis and treatment for influenza presents for persistent shortness of breath malaise and low-grade temperatures at home.  Denies any chest pain.  States that she feels more winded than usual.  Denies any worsening lower extremity swelling orthopnea.  States she is been compliant with dialysis sessions but missed Wednesday session due to flu like illness and had reduced session on Friday 2/2 diarrhea. No productive cough.  Was given nebulizer treatment in triage without any significant improvement.  Past Medical History:  Diagnosis Date  . Anemia of chronic disease   . Carotid arterial disease ([place])    a. 02/2013 U/S: 40-59% bilat ICA stenosis - *f/u 02/2014*  . Chronic constipation   . Chronic diastolic CHF (congestive heart failure) ([place])    a. 10/2013 Echo [facility]): EF 55-60%, mod conc LVH, mod MR, mildly dil LA, mild Ao sclerosis w/o stenosis.  . Colon polyps   . COPD (chronic obstructive pulmonary disease) ([place])   . Coronary artery disease    a. 05/2013 NSTEMI/PCI: LM 20d, LAD min irregs, LCX small, nl, OM1 nl, RCA dom 7m (2.5x16 Promus DES), PDA1 80p.  . Diabetes mellitus   . Diabetic neuropathy ([place])   . Diabetic retinopathy ([place]) 05/28/2013   Hx bilat retinal detachment, proliferative diab retinopathy and bilat vitreous hemorrhage   . Emphysema   . ESRD on hemodialysis ([place])    a. [facility] in [place], [place], on a TTS schedule.  She started dialysis in Feb 2014.  Etiology of renal failure not known, likely diabetes.  Has a left upper arm AV graft.  . History of bronchitis    Mar 2012  . History of pneumonia    June 2012  .  History of tobacco abuse    a. Quit 2012.  Marland Kitchen Hyperlipidemia   . Hypertension   . Moderate mitral insufficiency    a. 10/2013 Echo: EF 55-60%, mod MR.  . Myocardial infarct ([place]) 05/2013  . Peripheral vascular disease ([place])   . Renal insufficiency   . Sickle cell trait (HCC)    Family History  Problem Relation Age of Onset  . Stroke Mother   . Heart attack Mother   . Heart disease Mother   . Colon cancer Father   . Colon cancer Sister   . Heart attack Brother   . Stroke Brother   . Diabetes Brother   . Breast cancer Sister   . Diabetes Brother   . Diabetes Daughter   . Anesthesia problems Neg Hx   . Hypotension Neg Hx   . Malignant hyperthermia Neg Hx   . Pseudochol deficiency Neg Hx    Past Surgical History:  Procedure Laterality Date  . ABDOMINAL HYSTERECTOMY     2000  . CARDIAC CATHETERIZATION    . CARDIAC CATHETERIZATION N/A 05/02/2015   Procedure: Left Heart Cath and Coronary Angiography;  Surgeon: [name], MD;  Location: [place] [facility];  Service: Cardiovascular;  Laterality: N/A;  . COLONOSCOPY WITH PROPOFOL N/A 07/08/2016   Procedure: COLONOSCOPY WITH PROPOFOL;  Surgeon: [name], MD;  Location: [facility] ENDOSCOPY;  Service: Endoscopy;  Laterality: N/A;  . colonscopy    . CORONARY ANGIOPLASTY  05/28/2014   stent placement to the mid RCA  . DILATION AND CURETTAGE OF UTERUS     several in the early 80's  . ESOPHAGOGASTRODUODENOSCOPY     2012  . EYE SURGERY     bilateral laser 2012  . EYE SURGERY     right  . GAS INSERTION  09/30/2011   Procedure: INSERTION OF GAS;  Surgeon: [name], MD;  Location: [place];  Service: Ophthalmology;  Laterality: Right;  C3F8  . GAS/FLUID EXCHANGE  09/30/2011   Procedure: GAS/FLUID EXCHANGE;  Surgeon: [name], MD;  Location: [place];  Service: Ophthalmology;  Laterality: Right;  . LEFT HEART CATHETERIZATION WITH CORONARY ANGIOGRAM N/A 05/28/2013   Procedure: LEFT HEART CATHETERIZATION WITH CORONARY  ANGIOGRAM;  Surgeon: [name], MD;  Location: [facility] CATH LAB;  Service: Cardiovascular;  Laterality: N/A;  . PARS PLANA VITRECTOMY  04/22/2011   Procedure: PARS PLANA VITRECTOMY WITH 25 GAUGE;  Surgeon: [name], MD;  Location: [place];  Service: Ophthalmology;  Laterality: Left;  membrane peel, endolaser, gas fluid exchange, silicone oil, repair of complex traction retinal detachment  . PARS PLANA VITRECTOMY  09/30/2011   Procedure: PARS PLANA VITRECTOMY WITH 25 GAUGE;  Surgeon: [name], MD;  Location: [place];  Service: Ophthalmology;  Laterality: Right;  Endolaser; Repair of Complex Traction Retinal Detachment  . PARS PLANA VITRECTOMY  02/24/2012   Procedure: PARS PLANA VITRECTOMY WITH 25 GAUGE;  Surgeon: [name], MD;  Location: [place];  Service: Ophthalmology;  Laterality: Left;  . PTCA    . SILICON OIL REMOVAL  11/20/4313   Procedure: SILICON OIL REMOVAL;  Surgeon: [name], MD;  Location: [place];  Service: Ophthalmology;  Laterality: Left;  . THROMBECTOMY / ARTERIOVENOUS GRAFT REVISION    . TUBAL LIGATION     1979   Patient Active Problem List   Diagnosis Date Noted  . CHF (congestive heart failure) ([place]) 07/26/2017  . PAD (peripheral artery disease) ([place]) 03/07/2017  . Bilateral carotid artery stenosis 02/17/2017  . Complication of vascular access for dialysis 06/19/2016  . Atypical chest pain 05/21/2016  . Mitral regurgitation 02/06/2016  . Screening for breast cancer 07/27/2015  . Diastolic heart failure ([place]) 05/07/2015  . S/P coronary artery stent placement   . Leg pain 03/05/2015  . Neuritis or radiculitis due to rupture of lumbar intervertebral disc 11/28/2014  . Routine general medical examination at a health care facility 09/02/2013  . Osteoarthritis of right hip 08/23/2013  . DDD (degenerative disc disease), lumbosacral 08/23/2013  . Coronary artery disease of native artery of native heart with stable angina pectoris ([place]) 06/27/2013  .  Diabetic retinopathy ([place]) 05/28/2013  . Hyperlipidemia   . Syncope 01/21/2013  . Essential (primary) hypertension 11/08/2012  . Diabetic neuropathy ([place]) 02/18/2012  . Anemia in chronic kidney disease (CKD) 02/18/2012  . ESRD on hemodialysis ([place]) 08/21/2011  . Diabetes mellitus, type II ([place]) 06/30/2011  . COPD (chronic obstructive pulmonary disease) ([place]) 06/13/2011      Prior to Admission medications   Medication Sig Start Date End Date Taking? Authorizing Provider  albuterol (PROVENTIL HFA;VENTOLIN HFA) 108 (90 BASE) MCG/ACT inhaler Inhale 2 puffs into the lungs every 6 (six) hours as needed for wheezing or shortness of breath.    [provider]  albuterol (PROVENTIL) (2.5 MG/3ML) 0.083% nebulizer solution Take 2.5 mg by nebulization every 4 (four) hours as  needed for wheezing or shortness of breath.    [provider]  amLODipine (NORVASC) 10 MG tablet Take 10 mg by mouth daily.  09/17/16   [provider]  aspirin EC 81 MG tablet Take 81 mg by mouth daily.    [provider]  atorvastatin (LIPITOR) 40 MG tablet Take 1 tablet (40 mg total) by mouth daily. Patient taking differently: Take 40 mg by mouth daily at 6 PM.  07/03/16 03/11/18  Alisa Graff, FNP  carvedilol (COREG) 12.5 MG tablet IF BLOOD PRESSURE GREATER THAN 160, TAKE 2 TABLETS BY MOUTH TWICE DAILY, OTHERWISE TAKE 1 TABLET BY MOUTH TWICE DAILY. 03/02/17   Alisa Graff, FNP  cinacalcet (SENSIPAR) 30 MG tablet Take 30 mg by mouth daily.    [provider]  cloNIDine (CATAPRES) 0.1 MG tablet Take 0.1 mg by mouth 2 (two) times daily.    [provider]  furosemide (LASIX) 80 MG tablet Take 80 mg by mouth daily.     [provider]  isosorbide mononitrate (IMDUR) 60 MG 24 hr tablet Take 1 tablet (60 mg total) by mouth daily. 07/03/16 03/05/17  Alisa Graff, FNP  losartan (COZAAR) 50 MG tablet Take 50 mg by mouth daily.  12/16/16   [provider]    Melatonin 10 MG TABS Take 10 mg by mouth daily as needed (SLEEP).     [provider]  multivitamin (RENA-VIT) TABS tablet TAKE ONE TABLET BY MOUTH ONCE DAILY 11/09/16   Darylene Price A, FNP  naproxen sodium (ANAPROX) 220 MG tablet Take 220 mg by mouth daily as needed.    [provider]  nitroGLYCERIN (NITROSTAT) 0.4 MG SL tablet Place 1 tablet (0.4 mg total) under the tongue every 5 (five) minutes as needed for chest pain. 01/15/17   Burnard Hawthorne, FNP  pregabalin (LYRICA) 75 MG capsule Take 1 capsule (75 mg total) by mouth daily. 04/21/16   Burnard Hawthorne, FNP    Allergies Hydrocodone; Metformin; and Lisinopril    Social History Social History   Tobacco Use  . Smoking status: Former Smoker    Packs/day: 1.00    Years: 25.00    Pack years: 25.00    Last attempt to quit: 06/09/2010    Years since quitting: 7.1  . Smokeless tobacco: Never Used  Substance Use Topics  . Alcohol use: No  . Drug use: No    Review of Systems Patient denies headaches, rhinorrhea, blurry vision, numbness, shortness of breath, chest pain, edema, cough, abdominal pain, nausea, vomiting, diarrhea, dysuria, fevers, rashes or hallucinations unless otherwise stated above in HPI. ____________________________________________   PHYSICAL EXAM:  VITAL SIGNS: Vitals:   07/26/17 2232 07/26/17 2311  BP: (!) 185/47 (!) 160/42  Pulse: 75 77  Resp:    Temp:    SpO2:      Constitutional: Alert and oriented. Well appearing and in no acute distress. Eyes: Conjunctivae are normal.  Head: Atraumatic. Nose: No congestion/rhinnorhea. Mouth/Throat: Mucous membranes are moist.   Neck: No stridor. Painless ROM.  Cardiovascular: Normal rate, regular rhythm. Grossly normal heart sounds.  Good peripheral circulation. Respiratory: Normal respiratory effort.  No retractions. Lungs with coarse bibasilar breathsounds and inspiratory posterior crackles Gastrointestinal: Soft and nontender. No  distention. No abdominal bruits. No CVA tenderness. Genitourinary:  Musculoskeletal: No lower extremity tenderness, trace pedal edema.  No joint effusions. Neurologic:  Normal speech and language. No gross focal neurologic deficits are appreciated. No facial droop Skin:  Skin is  warm, dry and intact. No rash noted. Psychiatric: Mood and affect are normal. Speech and behavior are normal.  ____________________________________________   LABS (all labs ordered are listed, but only abnormal results are displayed)  Results for orders placed or performed during the hospital encounter of 07/26/17 (from the past 24 hour(s))  CBC with Differential/Platelet     Status: Abnormal   Collection Time: 07/26/17  6:15 PM  Result Value Ref Range   WBC 8.5 3.6 - 11.0 K/uL   RBC 2.34 (L) 3.80 - 5.20 MIL/uL   Hemoglobin 7.9 (L) 12.0 - 16.0 g/dL   HCT 23.6 (L) 35.0 - 47.0 %   MCV 100.7 (H) 80.0 - 100.0 fL   MCH 33.8 26.0 - 34.0 pg   MCHC 33.6 32.0 - 36.0 g/dL   RDW 16.7 (H) 11.5 - 14.5 %   Platelets 226 150 - 440 K/uL   Neutrophils Relative % 82 %   Neutro Abs 7.0 (H) 1.4 - 6.5 K/uL   Lymphocytes Relative 10 %   Lymphs Abs 0.8 (L) 1.0 - 3.6 K/uL   Monocytes Relative 5 %   Monocytes Absolute 0.4 0.2 - 0.9 K/uL   Eosinophils Relative 2 %   Eosinophils Absolute 0.2 0 - 0.7 K/uL   Basophils Relative 1 %   Basophils Absolute 0.1 0 - 0.1 K/uL  Comprehensive metabolic panel     Status: Abnormal   Collection Time: 07/26/17  6:15 PM  Result Value Ref Range   Sodium 140 135 - 145 mmol/L   Potassium 3.6 3.5 - 5.1 mmol/L   Chloride 101 101 - 111 mmol/L   CO2 29 22 - 32 mmol/L   Glucose, Bld 131 (H) 65 - 99 mg/dL   BUN 30 (H) 6 - 20 mg/dL   Creatinine, Ser 10.08 (H) 0.44 - 1.00 mg/dL   Calcium 9.2 8.9 - 10.3 mg/dL   Total Protein 6.4 (L) 6.5 - 8.1 g/dL   Albumin 3.2 (L) 3.5 - 5.0 g/dL   AST 17 15 - 41 U/L   ALT 13 (L) 14 - 54 U/L   Alkaline Phosphatase 86 38 - 126 U/L   Total Bilirubin 0.7 0.3 - 1.2  mg/dL   GFR calc non Af Amer 4 (L) >60 mL/min   GFR calc Af Amer 4 (L) >60 mL/min   Anion gap 10 5 - 15  Troponin I     Status: Abnormal   Collection Time: 07/26/17  6:15 PM  Result Value Ref Range   Troponin I 0.06 (HH) <0.03 ng/mL  Brain natriuretic peptide     Status: Abnormal   Collection Time: 07/26/17  6:15 PM  Result Value Ref Range   B Natriuretic Peptide 1,436.0 (H) 0.0 - 100.0 pg/mL  Glucose, capillary     Status: Abnormal   Collection Time: 07/26/17  9:23 PM  Result Value Ref Range   Glucose-Capillary 111 (H) 65 - 99 mg/dL   ____________________________________________  EKG My review and personal interpretation at Time: 15:36   Indication: sob  Rate: 80  Rhythm: sinus Axis: normal Other: normal intervals, no stemi ____________________________________________  RADIOLOGY  I personally reviewed all radiographic images ordered to evaluate for the above acute complaints and reviewed radiology reports and findings.  These findings were personally discussed with the patient.  Please see medical record for radiology report.  ____________________________________________   PROCEDURES  Procedure(s) performed:  Procedures  Due to difficulty with obtaining IV access, a 20G peripheral IV catheter was inserted using US guidance  into the right forearm.  The site was prepped with chlorhexidine and allowed to dry.  The patient tolerated the procedure without any complications.    Critical Care performed: no ____________________________________________   INITIAL IMPRESSION / ASSESSMENT AND PLAN / ED COURSE  Pertinent labs & imaging results that were available during my care of the patient were reviewed by me and considered in my medical decision making (see chart for details).  DDX: Asthma, copd, CHF, pna, ptx, malignancy, Pe, anemia   GERRICA CYGAN is a 62 y.o. who presents to the ED with a history of 6 ESRD on dialysis presents with symptoms as described above.  Patient  afebrile but in mild respiratory distress requiring supplemental oxygen.  Chest x-ray shows evidence of congestive heart failure.  No evidence of infectious process.  No evidence of ACS but I do suspect component of hypervolemia and volume overload given chest x-ray showing evidence of pulmonary edema and worsening shortness of breath.  Patient noted to be hypertensive requiring several doses of IV antihypertensive medication.  In addition to the Lasix given I do believe patient will require admission the hospital for further medical management.  Have discussed with the patient and available family all diagnostics and treatments performed thus far and all questions were answered to the best of my ability. The patient demonstrates understanding and agreement with plan.       ____________________________________________   FINAL CLINICAL IMPRESSION(S) / ED DIAGNOSES  Final diagnoses:  Acute on chronic congestive heart failure, unspecified heart failure type ([place])  Acute respiratory failure with hypoxia ([place])  ESRD on dialysis ([place])      NEW MEDICATIONS STARTED DURING THIS VISIT:  Current Discharge Medication List       Note:  This document was prepared using Dragon voice recognition software and may include unintentional dictation errors.    Merlyn Lot, MD 07/26/17 [phone]

## 2017-07-27 ENCOUNTER — Encounter: Payer: Self-pay | Admitting: *Deleted

## 2017-07-27 LAB — BASIC METABOLIC PANEL
Anion gap: 13 (ref 5–15)
BUN: 33 mg/dL — ABNORMAL HIGH (ref 6–20)
CALCIUM: 8.7 mg/dL — AB (ref 8.9–10.3)
CO2: 27 mmol/L (ref 22–32)
CREATININE: 10.74 mg/dL — AB (ref 0.44–1.00)
Chloride: 101 mmol/L (ref 101–111)
GFR calc Af Amer: 4 mL/min — ABNORMAL LOW (ref >60)
GFR calc non Af Amer: 3 mL/min — ABNORMAL LOW (ref >60)
GLUCOSE: 104 mg/dL — AB (ref 65–99)
Potassium: 3.6 mmol/L (ref 3.5–5.1)
Sodium: 141 mmol/L (ref 135–145)

## 2017-07-27 LAB — GLUCOSE, CAPILLARY
Glucose-Capillary: 105 mg/dL — ABNORMAL HIGH (ref 65–99)
Glucose-Capillary: 151 mg/dL — ABNORMAL HIGH (ref 65–99)
Glucose-Capillary: 82 mg/dL (ref 65–99)

## 2017-07-27 LAB — MRSA PCR SCREENING: MRSA by PCR: NEGATIVE

## 2017-07-27 MED ORDER — LOSARTAN POTASSIUM 50 MG PO TABS
100.0000 mg | ORAL_TABLET | Freq: Every day | ORAL | Status: DC
  Administered 2017-07-27 – 2017-07-28 (×2): 100 mg via ORAL
  Filled 2017-07-27 (×2): qty 2

## 2017-07-27 MED ORDER — MELATONIN 5 MG PO TABS
10.0000 mg | ORAL_TABLET | Freq: Every evening | ORAL | Status: DC | PRN
  Filled 2017-07-27: qty 2

## 2017-07-27 MED ORDER — PREGABALIN 75 MG PO CAPS
75.0000 mg | ORAL_CAPSULE | Freq: Every evening | ORAL | Status: DC
  Administered 2017-07-27: 75 mg via ORAL
  Filled 2017-07-27: qty 1

## 2017-07-27 MED ORDER — AMLODIPINE BESYLATE 10 MG PO TABS
10.0000 mg | ORAL_TABLET | Freq: Every day | ORAL | Status: DC
  Administered 2017-07-27 – 2017-07-28 (×2): 10 mg via ORAL
  Filled 2017-07-27 (×2): qty 1

## 2017-07-27 MED ORDER — ISOSORBIDE MONONITRATE ER 30 MG PO TB24
60.0000 mg | ORAL_TABLET | Freq: Every day | ORAL | Status: DC
  Administered 2017-07-28: 60 mg via ORAL
  Filled 2017-07-27: qty 2

## 2017-07-27 MED ORDER — DIPHENHYDRAMINE HCL 25 MG PO CAPS
25.0000 mg | ORAL_CAPSULE | Freq: Four times a day (QID) | ORAL | Status: DC | PRN
  Administered 2017-07-27 (×2): 25 mg via ORAL
  Filled 2017-07-27 (×2): qty 1

## 2017-07-27 MED ORDER — CARVEDILOL 12.5 MG PO TABS
12.5000 mg | ORAL_TABLET | Freq: Two times a day (BID) | ORAL | Status: DC
  Administered 2017-07-27 – 2017-07-28 (×2): 12.5 mg via ORAL
  Filled 2017-07-27 (×2): qty 1

## 2017-07-27 NOTE — Progress Notes (Signed)
[facility] NAME: Theresa [name]    MR#:  [id number]  DATE OF BIRTH:  Jun 30, 1955  SUBJECTIVE:  CHIEF COMPLAINT:   Chief Complaint  Patient presents with  . Shortness of Breath  cough, SOB, some better with dialysis REVIEW OF SYSTEMS:  Review of Systems  Constitutional: Negative for chills, fever and weight loss.  HENT: Negative for nosebleeds and sore throat.   Eyes: Negative for blurred vision.  Respiratory: Positive for cough and shortness of breath. Negative for wheezing.   Cardiovascular: Negative for chest pain, orthopnea, leg swelling and PND.  Gastrointestinal: Negative for abdominal pain, constipation, diarrhea, heartburn, nausea and vomiting.  Genitourinary: Negative for dysuria and urgency.  Musculoskeletal: Negative for back pain.  Skin: Negative for rash.  Neurological: Negative for dizziness, speech change, focal weakness and headaches.  Endo/Heme/Allergies: Does not bruise/bleed easily.  Psychiatric/Behavioral: Negative for depression.    DRUG ALLERGIES:   Allergies  Allergen Reactions  . Hydrocodone Hives  . Metformin Diarrhea and Other (See Comments)    Other reaction(s): Distress (finding)  . Lisinopril     unknown   VITALS:  Blood pressure (!) 169/47, pulse 72, temperature 98.4 F (36.9 C), temperature source Oral, resp. rate 15, height 5' 7.5" (1.715 m), weight 79.7 kg (175 lb 11.3 oz), SpO2 100 %. PHYSICAL EXAMINATION:  Physical Exam  Constitutional: She is oriented to person, place, and time and well-developed, well-nourished, and in no distress.  HENT:  Head: Normocephalic and atraumatic.  Eyes: Conjunctivae and EOM are normal. Pupils are equal, round, and reactive to light.  Neck: Normal range of motion. Neck supple. No tracheal deviation present. No thyromegaly present.  Cardiovascular: Normal rate, regular rhythm and normal heart sounds.  Pulmonary/Chest: Effort normal and breath sounds  normal. No respiratory distress. She has no wheezes. She exhibits no tenderness.  Abdominal: Soft. Bowel sounds are normal. She exhibits no distension. There is no tenderness.  Musculoskeletal: Normal range of motion.  Neurological: She is alert and oriented to person, place, and time. No cranial nerve deficit.  Skin: Skin is warm and dry. No rash noted.  Psychiatric: Mood and affect normal.   LABORATORY PANEL:  Female CBC Recent Labs  Lab 07/26/17 1815  WBC 8.5  HGB 7.9*  HCT 23.6*  PLT 226   ------------------------------------------------------------------------------------------------------------------ Chemistries  Recent Labs  Lab 07/26/17 1815 07/27/17 0455  NA 140 141  K 3.6 3.6  CL 101 101  CO2 29 27  GLUCOSE 131* 104*  BUN 30* 33*  CREATININE 10.08* 10.74*  CALCIUM 9.2 8.7*  AST 17  --   ALT 13*  --   ALKPHOS 86  --   BILITOT 0.7  --    RADIOLOGY:  No results found. ASSESSMENT AND PLAN:   1 acute on chronic diastolic congestive heart failure exacerbation Secondary to acute fluid overload stage in patient with end-stage renal disease - IV Lasix twice daily, strict I&O monitoring, daily weights, renal/heart healthy diet,  Most recent echocardiogram with preserved left ventricular systolic function/ejection fraction  2 acute fluid overload state Secondary to end-stage renal disease  3 chronic end-stage renal disease on hemodialysis S/p HD today per Nephro  4 chronic benign essential hypertension Exacerbated by acute fluid overload state Continue home regiment, IV hydralazine as needed systolic blood pressure greater than 160, vitals per routine, make changes as per necessary  5 acute on chronic hypoxic respiratory failure Supplemental oxygen as needed with weaning as tolerated, patient  is on 2 L via nasal cannula as needed at home  6 COPD without exacerbation Stable Breathing treatments as needed  7 chronic diabetes mellitus type 2 Sliding  scale insulin with Accu-Cheks per routine       All the records are reviewed and case discussed with Care Management/Social Worker. Management plans discussed with the patient, family and they are in agreement.  CODE STATUS: Full Code  TOTAL TIME TAKING CARE OF THIS PATIENT: 35 minutes.   More than 50% of the time was spent in counseling/coordination of care: YES  POSSIBLE D/C IN 1-2 DAYS, DEPENDING ON CLINICAL CONDITION.   Max Sane M.D on 07/27/2017 at 6:32 PM  Between 7am to 6pm - Pager - [phone]  After 6pm go to [email or web address] - Proofreader  [facility]  Office  [phone]  CC: Primary care physician; Burnard Hawthorne, FNP  Note: This dictation was prepared with Dragon dictation along with smaller phrase technology. Any transcriptional errors that result from this process are unintentional.

## 2017-07-27 NOTE — Progress Notes (Signed)
Pre HD  

## 2017-07-27 NOTE — Progress Notes (Signed)
[facility], [place] 07/27/17  Subjective:   Patient known to our practice from previous admissions.  She presented at this time for shortness of breath.  Patient was recently diagnosed with flu and finished treatment She now presents with fever, nausea, vomiting at home.     HEMODIALYSIS FLOWSHEET:  Blood Flow Rate (mL/min): 400 mL/min Arterial Pressure (mmHg): -100 mmHg Venous Pressure (mmHg): 190 mmHg Transmembrane Pressure (mmHg): 60 mmHg Ultrafiltration Rate (mL/min): 850 mL/min Dialysate Flow Rate (mL/min): 800 ml/min Conductivity: Machine : 14.1 Conductivity: Machine : 14.1 Dialysis Fluid Bolus: Normal Saline Bolus Amount (mL): 250 mL    Objective:  Vital signs in last 24 hours:  Temp:  [98.4 F (36.9 C)] 98.4 F (36.9 C) (02/18 0351) Pulse Rate:  [69-86] 72 (02/18 1315) Resp:  [0-24] 15 (02/18 1315) BP: (153-201)/(42-101) 169/47 (02/18 1534) SpO2:  [87 %-100 %] 100 % (02/18 1315) Weight:  [79.7 kg (175 lb 11.3 oz)] 79.7 kg (175 lb 11.3 oz) (02/18 0915)  Weight change:  Filed Weights   07/26/17 1545 07/27/17 0407 07/27/17 0915  Weight: 78.9 kg (174 lb) 79.7 kg (175 lb 11.3 oz) 79.7 kg (175 lb 11.3 oz)    Intake/Output:    Intake/Output Summary (Last 24 hours) at 07/27/2017 1650 Last data filed at 07/27/2017 1315 Gross per 24 hour  Intake -  Output 2568 ml  Net -2568 ml     Physical Exam: General:  No acute distress, laying in the bed  HEENT  anicteric  Neck  supple  Pulm/lungs  normal breathing effort, mild scattered rhonchi  CVS/Heart  irregular tachycardic  Abdomen:   Soft, nontender  Extremities:  No edema  Neurologic:  Alert, oriented  Skin:  No rashes  Access: Left arm  AVG       Basic Metabolic Panel:  Recent Labs  Lab 07/26/17 1815 07/27/17 0455  NA 140 141  K 3.6 3.6  CL 101 101  CO2 29 27  GLUCOSE 131* 104*  BUN 30* 33*  CREATININE 10.08* 10.74*  CALCIUM 9.2 8.7*     CBC: Recent Labs  Lab  07/26/17 1815  WBC 8.5  NEUTROABS 7.0*  HGB 7.9*  HCT 23.6*  MCV 100.7*  PLT 226      Lab Results  Component Value Date   HEPBSAG Negative 05/22/2016   HEPBSAB NONREACTIVE 11/12/2012      Microbiology:  Recent Results (from the past 240 hour(s))  MRSA PCR Screening     Status: None   Collection Time: 07/27/17 12:29 AM  Result Value Ref Range Status   MRSA by PCR NEGATIVE NEGATIVE Final    Comment:        The GeneXpert MRSA Assay (FDA approved for NASAL specimens only), is one component of a comprehensive MRSA colonization surveillance program. It is not intended to diagnose MRSA infection nor to guide or monitor treatment for MRSA infections. Performed at [facility], [address] [place], [place] [address]     Coagulation Studies: No results for input(s): LABPROT, INR in the last 72 hours.  Urinalysis: No results for input(s): COLORURINE, LABSPEC, PHURINE, GLUCOSEU, HGBUR, BILIRUBINUR, KETONESUR, PROTEINUR, UROBILINOGEN, NITRITE, LEUKOCYTESUR in the last 72 hours.  Invalid input(s): APPERANCEUR    Imaging: Dg Chest 2 View  Result Date: 07/26/2017 CLINICAL DATA:  History of CHF and COPD. EXAM: CHEST  2 VIEW COMPARISON:  07/15/2017. FINDINGS: Heart size appears normal. Aortic atherosclerosis. Small bilateral pleural effusions and diffuse pulmonary vascular congestion identified compatible with  CHF. No airspace opacities. IMPRESSION: 1. Mild congestive heart failure. 2.  Aortic Atherosclerosis (ICD10-I70.0). Electronically Signed   By: Kerby Moors M.D.   On: 07/26/2017 16:49     Medications:   . sodium chloride     . amLODipine  10 mg Oral Daily  . carvedilol  12.5 mg Oral BID WC  . docusate sodium  100 mg Oral BID  . heparin  5,000 Units Subcutaneous Q8H  . insulin aspart  0-15 Units Subcutaneous TID WC  . insulin aspart  0-5 Units Subcutaneous QHS  . isosorbide mononitrate  60 mg Oral Daily  . losartan  100 mg Oral Daily  .  pregabalin  75 mg Oral QPM  . sodium chloride flush  3 mL Intravenous Q12H   sodium chloride, acetaminophen **OR** acetaminophen, diphenhydrAMINE, hydrALAZINE, ipratropium-albuterol, Melatonin, ondansetron **OR** ondansetron (ZOFRAN) IV, oxyCODONE, polyethylene glycol, sodium chloride flush  Assessment/ Plan:  62 y.o. African-American female with diabetes type 2 with severe complications including diabetic retinopathy, neuropathy,nephropathy, long standing hypertension, COPD, hyperlipidemia, congestive heart failure coronary disease with a history of non-STEMI and PCI 2014  MWF [place].   1.  End-stage renal disease Patient seen during dialysis treatment.  Tolerating well Ultrafiltration goal 2-3 kg as tolerated  2.  Anemia of chronic kidney disease Current hemoglobin 7.9 Continue Epogen with scheduled hemodialysis  3.  Secondary hyperparathyroidism Monitor phosphorus No home binders listed  4.  Shortness of breath Recently treated for flu Probably another viral infection and some fluid overload 2500 cc removed with hemodialysis today    LOS: 1 Lashona Schaaf Candiss Norse 2/18/20194:50 PM  [place], [place]  Note: This note was prepared with Dragon dictation. Any transcription errors are unintentional

## 2017-07-27 NOTE — Progress Notes (Signed)
HD complete 

## 2017-07-27 NOTE — Progress Notes (Signed)
HD started. 

## 2017-07-28 LAB — BASIC METABOLIC PANEL
Anion gap: 8 (ref 5–15)
BUN: 12 mg/dL (ref 6–20)
CALCIUM: 8.8 mg/dL — AB (ref 8.9–10.3)
CO2: 30 mmol/L (ref 22–32)
CREATININE: 5.83 mg/dL — AB (ref 0.44–1.00)
Chloride: 99 mmol/L — ABNORMAL LOW (ref 101–111)
GFR calc Af Amer: 8 mL/min — ABNORMAL LOW (ref >60)
GFR, EST NON AFRICAN AMERICAN: 7 mL/min — AB (ref >60)
GLUCOSE: 83 mg/dL (ref 65–99)
Potassium: 3.7 mmol/L (ref 3.5–5.1)
Sodium: 137 mmol/L (ref 135–145)

## 2017-07-28 LAB — CBC
HCT: 22.4 % — ABNORMAL LOW (ref 35.0–47.0)
Hemoglobin: 7.7 g/dL — ABNORMAL LOW (ref 12.0–16.0)
MCH: 34.3 pg — ABNORMAL HIGH (ref 26.0–34.0)
MCHC: 34.2 g/dL (ref 32.0–36.0)
MCV: 100.2 fL — AB (ref 80.0–100.0)
PLATELETS: 226 10*3/uL (ref 150–440)
RBC: 2.24 MIL/uL — ABNORMAL LOW (ref 3.80–5.20)
RDW: 16.2 % — AB (ref 11.5–14.5)
WBC: 7.3 10*3/uL (ref 3.6–11.0)

## 2017-07-28 LAB — GLUCOSE, CAPILLARY: Glucose-Capillary: 86 mg/dL (ref 65–99)

## 2017-07-28 LAB — HIV ANTIBODY (ROUTINE TESTING W REFLEX): HIV Screen 4th Generation wRfx: NONREACTIVE

## 2017-07-28 MED ORDER — HYDRALAZINE HCL 20 MG/ML IJ SOLN
10.0000 mg | INTRAMUSCULAR | Status: AC
  Administered 2017-07-28: 10 mg via INTRAVENOUS
  Filled 2017-07-28: qty 1

## 2017-07-28 MED ORDER — LOSARTAN POTASSIUM 100 MG PO TABS: 100.0000 mg | ORAL_TABLET | Freq: Every day | ORAL | 0 refills | Status: DC

## 2017-07-28 MED ORDER — ISOSORBIDE MONONITRATE ER 60 MG PO TB24: 60.0000 mg | ORAL_TABLET | Freq: Every day | ORAL | 0 refills | Status: DC

## 2017-07-28 NOTE — Progress Notes (Signed)
07/28/2017 10:37 AM  [name] to be D/C'd Home per MD order.  Discussed prescriptions and follow up appointments with the patient. Prescriptions given to patient, medication list explained in detail. Pt verbalized understanding.  Allergies as of 07/28/2017      Reactions   Hydrocodone Hives   Metformin Diarrhea, Other (See Comments)   Other reaction(s): Distress (finding)   Lisinopril    unknown      Medication List    TAKE these medications   albuterol 108 (90 Base) MCG/ACT inhaler Commonly known as:  PROVENTIL HFA;VENTOLIN HFA Inhale 2 puffs into the lungs every 6 (six) hours as needed for wheezing or shortness of breath.   albuterol (2.5 MG/3ML) 0.083% nebulizer solution Commonly known as:  PROVENTIL Take 2.5 mg by nebulization every 4 (four) hours as needed for wheezing or shortness of breath.   amLODipine 10 MG tablet Commonly known as:  NORVASC Take 10 mg by mouth daily.   aspirin EC 81 MG tablet Take 81 mg by mouth daily.   atorvastatin 40 MG tablet Commonly known as:  LIPITOR Take 1 tablet (40 mg total) by mouth daily.   carvedilol 12.5 MG tablet Commonly known as:  COREG IF BLOOD PRESSURE GREATER THAN 160, TAKE 2 TABLETS BY MOUTH TWICE DAILY, OTHERWISE TAKE 1 TABLET BY MOUTH TWICE DAILY.   cloNIDine 0.1 MG tablet Commonly known as:  CATAPRES Take 0.1 mg by mouth 2 (two) times daily.   furosemide 80 MG tablet Commonly known as:  LASIX Take 80 mg by mouth daily.   isosorbide mononitrate 60 MG 24 hr tablet Commonly known as:  IMDUR Take 1 tablet (60 mg total) by mouth daily.   losartan 100 MG tablet Commonly known as:  COZAAR Take 1 tablet (100 mg total) by mouth daily. What changed:    medication strength  how much to take   Melatonin 10 MG Tabs Take 10 mg by mouth daily as needed (SLEEP).   multivitamin Tabs tablet TAKE ONE TABLET BY MOUTH ONCE DAILY   nitroGLYCERIN 0.4 MG SL tablet Commonly known as:  NITROSTAT Place 1 tablet (0.4 mg  total) under the tongue every 5 (five) minutes as needed for chest pain.   pregabalin 75 MG capsule Commonly known as:  LYRICA Take 1 capsule (75 mg total) by mouth daily.       Vitals:   07/28/17 0853 07/28/17 0854  BP:    Pulse:    Resp:    Temp:    SpO2: 100% 100%    Skin clean, dry and intact without evidence of skin break down, no evidence of skin tears noted. IV catheter discontinued intact. Site without signs and symptoms of complications. Dressing and pressure applied. Pt denies pain at this time. No complaints noted.  An After Visit Summary was printed and given to the patient. Patient escorted via [place], and D/C home via private auto.  Theresa [name]

## 2017-07-28 NOTE — Discharge Instructions (Signed)
Pulmonary Edema °Pulmonary edema is abnormal fluid buildup in the lungs that can make it hard to breathe. °Follow these instructions at home: °· Talk to your doctor about an exercise program. °· Eat a healthy diet: °? Eat fresh fruits, vegetables, and lean meats. °? Limit high fat and salty foods. °? Avoid processed, canned, or fried foods. °? Avoid fast food. °· Follow your doctor's advice about taking medicine and recording the medicine you take. °· Follow your doctor's advice about keeping a record of your weight. °· Talk to your doctor about keeping track of your blood pressure. °· Do not smoke. °· Do not use nicotine patches or nicotine gum. °· Make a follow-up appointment with your doctor. °· Ask your doctor for a copy of your latest heart tracing (ECG) and keep a copy with you at all times. °Get help right away if: °· You have chest pain. THIS IS AN EMERGENCY. Do not wait to see if the pain will go away. Call for local emergency medical help. Do not drive yourself to the hospital. °· You have sweating, feel sick to your stomach (nauseous), or are experiencing shortness of breath. °· Your weight increases more than your doctor tells you it should. °· You start to have shortness of breath. °· You notice more swelling in your hands, feet, ankles, or belly. °· You have dizziness, blurred vision, headache, or unsteadiness that does not go away. °· You cough up bloody spit. °· You have a cough that does not go away. °· You are unable to sleep because it is hard to breathe. °· You begin to feel a “jumping” or “fluttering” sensation (palpitations) in the chest that is unusual for you. °This information is not intended to replace advice given to you by your health care provider. Make sure you discuss any questions you have with your health care provider. °Document Released: 05/14/2009 Document Revised: 11/01/2015 Document Reviewed: 01/31/2013 °Elsevier Interactive Patient Education © 2018 Elsevier Inc. ° °

## 2017-07-28 NOTE — Care Management (Signed)
Amanda Morris dialysis liaison notified of discharge  

## 2017-07-28 NOTE — Care Management Important Message (Signed)
Important Message  Patient Details  Name: Theresa [name] MRN: [id number] Date of Birth: [birth year]   Medicare Important Message Given:  N/A - LOS <3 / Initial given by admissions    Beverly Sessions, RN 07/28/2017, 9:54 AM

## 2017-07-29 DIAGNOSIS — D509 Iron deficiency anemia, unspecified: Secondary | ICD-10-CM | POA: Diagnosis not present

## 2017-07-29 DIAGNOSIS — N186 End stage renal disease: Secondary | ICD-10-CM | POA: Diagnosis not present

## 2017-07-29 DIAGNOSIS — D631 Anemia in chronic kidney disease: Secondary | ICD-10-CM | POA: Diagnosis not present

## 2017-07-29 DIAGNOSIS — N2581 Secondary hyperparathyroidism of renal origin: Secondary | ICD-10-CM | POA: Diagnosis not present

## 2017-07-29 DIAGNOSIS — Z992 Dependence on renal dialysis: Secondary | ICD-10-CM | POA: Diagnosis not present

## 2017-07-30 ENCOUNTER — Ambulatory Visit (INDEPENDENT_AMBULATORY_CARE_PROVIDER_SITE_OTHER): Payer: Medicare Other | Admitting: Podiatry

## 2017-07-30 ENCOUNTER — Encounter: Payer: Self-pay | Admitting: Podiatry

## 2017-07-30 DIAGNOSIS — E1140 Type 2 diabetes mellitus with diabetic neuropathy, unspecified: Secondary | ICD-10-CM

## 2017-07-30 DIAGNOSIS — B351 Tinea unguium: Secondary | ICD-10-CM | POA: Diagnosis not present

## 2017-07-30 DIAGNOSIS — M79609 Pain in unspecified limb: Principal | ICD-10-CM

## 2017-07-30 DIAGNOSIS — M79676 Pain in unspecified toe(s): Secondary | ICD-10-CM

## 2017-07-30 NOTE — Progress Notes (Signed)
Complaint:  Visit Type: Patient returns to my office for continued preventative foot care services. Complaint: Patient states" my nails have grown long and thick and become painful to walk and wear shoes" Patient has been diagnosed with DM with no foot complications. The patient presents for preventative foot care services. No changes to ROS  Podiatric Exam: Vascular: dorsalis pedis and posterior tibial pulses are palpable bilateral. Capillary return is immediate. Temperature gradient is WNL. Skin turgor WNL  Sensorium: Normal Semmes Weinstein monofilament test. Normal tactile sensation bilaterally. Nail Exam: Pt has thick disfigured discolored nails with subungual debris noted bilateral entire nail hallux through fifth toenails.  Pincer nails  B/L Ulcer Exam: There is no evidence of ulcer or pre-ulcerative changes or infection. Orthopedic Exam: Muscle tone and strength are WNL. No limitations in general ROM. No crepitus or effusions noted. Foot type and digits show no abnormalities. Bony prominences are unremarkable. Skin: No Porokeratosis. No infection or ulcers  Diagnosis:  Onychomycosis, , Pain in right toe, pain in left toes  Treatment & Plan Procedures and Treatment: Consent by patient was obtained for treatment procedures. The patient understood the discussion of treatment and procedures well. All questions were answered thoroughly reviewed. Debridement of mycotic and hypertrophic toenails, 1 through 5 bilateral and clearing of subungual debris. No ulceration, no infection noted. ABN signed for 2019. Return Visit-Office Procedure: Patient instructed to return to the office for a follow up visit 3 months for continued evaluation and treatment.    Gardiner Barefoot DPM

## 2017-07-31 DIAGNOSIS — N186 End stage renal disease: Secondary | ICD-10-CM | POA: Diagnosis not present

## 2017-07-31 DIAGNOSIS — D509 Iron deficiency anemia, unspecified: Secondary | ICD-10-CM | POA: Diagnosis not present

## 2017-07-31 DIAGNOSIS — D631 Anemia in chronic kidney disease: Secondary | ICD-10-CM | POA: Diagnosis not present

## 2017-07-31 DIAGNOSIS — Z992 Dependence on renal dialysis: Secondary | ICD-10-CM | POA: Diagnosis not present

## 2017-07-31 DIAGNOSIS — N2581 Secondary hyperparathyroidism of renal origin: Secondary | ICD-10-CM | POA: Diagnosis not present

## 2017-08-01 NOTE — Discharge Summary (Signed)
[facility] [place] NAME: Theresa [name]    MR#:  [id number]  DATE OF BIRTH:  [birth year]  DATE OF ADMISSION:  07/26/2017   ADMITTING PHYSICIAN: Gorden Harms, MD  DATE OF DISCHARGE: 07/28/2017 11:00 AM  PRIMARY CARE PHYSICIAN: Burnard Hawthorne, FNP   ADMISSION DIAGNOSIS:  ESRD on dialysis ([place]) [N18.6, Z99.2] Acute respiratory failure with hypoxia ([place]) [J96.01] Acute on chronic congestive heart failure, unspecified heart failure type ([place]) [I50.9] DISCHARGE DIAGNOSIS:  Active Problems:   CHF (congestive heart failure) ([place])  SECONDARY DIAGNOSIS:   Past Medical History:  Diagnosis Date  . Anemia of chronic disease   . Carotid arterial disease ([place])    a. 02/2013 U/S: 40-59% bilat ICA stenosis - *f/u 02/2014*  . Chronic constipation   . Chronic diastolic CHF (congestive heart failure) ([place])    a. 10/2013 Echo [facility]): EF 55-60%, mod conc LVH, mod MR, mildly dil LA, mild Ao sclerosis w/o stenosis.  . Colon polyps   . COPD (chronic obstructive pulmonary disease) ([place])   . Coronary artery disease    a. 05/2013 NSTEMI/PCI: LM 20d, LAD min irregs, LCX small, nl, OM1 nl, RCA dom 35m (2.5x16 Promus DES), PDA1 80p.  . Diabetes mellitus   . Diabetic neuropathy ([place])   . Diabetic retinopathy ([facility]) 05/28/2013   Hx bilat retinal detachment, proliferative diab retinopathy and bilat vitreous hemorrhage   . Emphysema   . ESRD on hemodialysis ([place])    a. [facility] in [place], [place], on a MWF schedule.  She started dialysis in Feb 2014.  Etiology of renal failure not known, likely diabetes.  Has a left upper arm AV graft.  . History of bronchitis    Mar 2012  . History of pneumonia    June 2012  . History of tobacco abuse    a. Quit 2012.  Marland Kitchen Hyperlipidemia   . Hypertension   . Moderate mitral insufficiency    a. 10/2013 Echo: EF 55-60%, mod MR.  . Myocardial infarct ([place]) 05/2013  . Peripheral vascular disease ([place])   . Renal insufficiency     . Sickle cell trait [facility])    HOSPITAL COURSE:  1acute on chronic diastolic congestive heart failure exacerbation Secondary to acute fluid overload stage in patient with end-stage renal disease Most recent echocardiogram with preserved left ventricular systolic function/ejection fraction  2acute fluid overload state Secondary to end-stage renal disease  3chronic end-stage renal disease on hemodialysis  4chronic benign essential hypertension Exacerbated by acute fluid overload state  5acute on chronic hypoxic respiratory failure Supplemental oxygen as needed with weaning as tolerated, patient is on 2 L via nasal cannula as needed at home  6COPD without exacerbation  7chronic diabetes mellitus type 2 DISCHARGE CONDITIONS:  stable CONSULTS OBTAINED:  Treatment Team:  Murlean Iba, MD DRUG ALLERGIES:   Allergies  Allergen Reactions  . Hydrocodone Hives  . Metformin Diarrhea and Other (See Comments)    Other reaction(s): Distress (finding)  . Lisinopril     unknown   DISCHARGE MEDICATIONS:   Allergies as of 07/28/2017      Reactions   Hydrocodone Hives   Metformin Diarrhea, Other (See Comments)   Other reaction(s): Distress (finding)   Lisinopril    unknown      Medication List    TAKE these medications   albuterol 108 (90 Base) MCG/ACT inhaler Commonly known as:  PROVENTIL HFA;VENTOLIN HFA Inhale 2 puffs into the lungs every 6 (six) hours as  needed for wheezing or shortness of breath.   albuterol (2.5 MG/3ML) 0.083% nebulizer solution Commonly known as:  PROVENTIL Take 2.5 mg by nebulization every 4 (four) hours as needed for wheezing or shortness of breath.   amLODipine 10 MG tablet Commonly known as:  NORVASC Take 10 mg by mouth daily.   aspirin EC 81 MG tablet Take 81 mg by mouth daily.   atorvastatin 40 MG tablet Commonly known as:  LIPITOR Take 1 tablet (40 mg total) by mouth daily.   carvedilol 12.5 MG tablet Commonly known as:   COREG IF BLOOD PRESSURE GREATER THAN 160, TAKE 2 TABLETS BY MOUTH TWICE DAILY, OTHERWISE TAKE 1 TABLET BY MOUTH TWICE DAILY.   cloNIDine 0.1 MG tablet Commonly known as:  CATAPRES Take 0.1 mg by mouth 2 (two) times daily.   furosemide 80 MG tablet Commonly known as:  LASIX Take 80 mg by mouth daily.   isosorbide mononitrate 60 MG 24 hr tablet Commonly known as:  IMDUR Take 1 tablet (60 mg total) by mouth daily.   losartan 100 MG tablet Commonly known as:  COZAAR Take 1 tablet (100 mg total) by mouth daily. What changed:    medication strength  how much to take   Melatonin 10 MG Tabs Take 10 mg by mouth daily as needed (SLEEP).   multivitamin Tabs tablet TAKE ONE TABLET BY MOUTH ONCE DAILY   nitroGLYCERIN 0.4 MG SL tablet Commonly known as:  NITROSTAT Place 1 tablet (0.4 mg total) under the tongue every 5 (five) minutes as needed for chest pain.   pregabalin 75 MG capsule Commonly known as:  LYRICA Take 1 capsule (75 mg total) by mouth daily.        DISCHARGE INSTRUCTIONS:   DIET:  Renal diet DISCHARGE CONDITION:  Stable ACTIVITY:  Activity as tolerated OXYGEN:  Home Oxygen: Yes.    Oxygen Delivery: 2 liters via [place] As need DISCHARGE LOCATION:  home   If you experience worsening of your admission symptoms, develop shortness of breath, life threatening emergency, suicidal or homicidal thoughts you must seek medical attention immediately by calling 911 or calling your MD immediately  if symptoms less severe.  You Must read complete instructions/literature along with all the possible adverse reactions/side effects for all the Medicines you take and that have been prescribed to you. Take any new Medicines after you have completely understood and accpet all the possible adverse reactions/side effects.   Please note  You were cared for by a hospitalist during your hospital stay. If you have any questions about your discharge medications or the care you received  while you were in the hospital after you are discharged, you can call the unit and asked to speak with the hospitalist on call if the hospitalist that took care of you is not available. Once you are discharged, your primary care physician will handle any further medical issues. Please note that NO REFILLS for any discharge medications will be authorized once you are discharged, as it is imperative that you return to your primary care physician (or establish a relationship with a primary care physician if you do not have one) for your aftercare needs so that they can reassess your need for medications and monitor your lab values.    On the day of Discharge:  VITAL SIGNS:  Blood pressure (!) 187/58, pulse 79, temperature 98.6 F (37 C), temperature source Oral, resp. rate 17, height 5' 7.5" (1.715 m), weight 76.4 kg (168 lb 6.9 oz), SpO2  100 %. PHYSICAL EXAMINATION:  GENERAL:  62 y.o.-year-old patient lying in the bed with no acute distress.  EYES: Pupils equal, round, reactive to light and accommodation. No scleral icterus. Extraocular muscles intact.  HEENT: Head atraumatic, normocephalic. Oropharynx and nasopharynx clear.  NECK:  Supple, no jugular venous distention. No thyroid enlargement, no tenderness.  LUNGS: Normal breath sounds bilaterally, no wheezing, rales,rhonchi or crepitation. No use of accessory muscles of respiration.  CARDIOVASCULAR: S1, S2 normal. No murmurs, rubs, or gallops.  ABDOMEN: Soft, non-tender, non-distended. Bowel sounds present. No organomegaly or mass.  EXTREMITIES: No pedal edema, cyanosis, or clubbing.  NEUROLOGIC: Cranial nerves II through XII are intact. Muscle strength 5/5 in all extremities. Sensation intact. Gait not checked.  PSYCHIATRIC: The patient is alert and oriented x 3.  SKIN: No obvious rash, lesion, or ulcer.  DATA REVIEW:   CBC Recent Labs  Lab 07/28/17 0359  WBC 7.3  HGB 7.7*  HCT 22.4*  PLT 226    Chemistries  Recent Labs  Lab  07/26/17 1815  07/28/17 0359  NA 140   < > 137  K 3.6   < > 3.7  CL 101   < > 99*  CO2 29   < > 30  GLUCOSE 131*   < > 83  BUN 30*   < > 12  CREATININE 10.08*   < > 5.83*  CALCIUM 9.2   < > 8.8*  AST 17  --   --   ALT 13*  --   --   ALKPHOS 86  --   --   BILITOT 0.7  --   --    < > = values in this interval not displayed.     Follow-up Information    Arnett, Yvetta Coder, FNP. Go on 08/07/2017.   Specialty:  Family Medicine Why:  Friday at 11:30am for hospital follow-up Contact information: [place] [address] [phone]            Management plans discussed with the patient, family and they are in agreement.  CODE STATUS: Prior   TOTAL TIME TAKING CARE OF THIS PATIENT: 45 minutes.    Max Sane M.D on 08/01/2017 at 9:08 AM  Between 7am to 6pm - Pager - [phone]  After 6pm go to [email or web address] - Proofreader  [facility]  Office  [phone]  CC: Primary care physician; Burnard Hawthorne, FNP   Note: This dictation was prepared with Dragon dictation along with smaller phrase technology. Any transcriptional errors that result from this process are unintentional.

## 2017-08-03 DIAGNOSIS — D509 Iron deficiency anemia, unspecified: Secondary | ICD-10-CM | POA: Diagnosis not present

## 2017-08-03 DIAGNOSIS — D631 Anemia in chronic kidney disease: Secondary | ICD-10-CM | POA: Diagnosis not present

## 2017-08-03 DIAGNOSIS — N186 End stage renal disease: Secondary | ICD-10-CM | POA: Diagnosis not present

## 2017-08-03 DIAGNOSIS — N2581 Secondary hyperparathyroidism of renal origin: Secondary | ICD-10-CM | POA: Diagnosis not present

## 2017-08-03 DIAGNOSIS — Z992 Dependence on renal dialysis: Secondary | ICD-10-CM | POA: Diagnosis not present

## 2017-08-04 ENCOUNTER — Telehealth: Payer: Self-pay

## 2017-08-04 DIAGNOSIS — E041 Nontoxic single thyroid nodule: Secondary | ICD-10-CM | POA: Diagnosis not present

## 2017-08-04 DIAGNOSIS — N2581 Secondary hyperparathyroidism of renal origin: Secondary | ICD-10-CM | POA: Diagnosis not present

## 2017-08-04 NOTE — Telephone Encounter (Signed)
Copied from [place] [id number]. Topic: Appointment Scheduling - Scheduling Inquiry for Clinic >> Aug 04, 2017 11:41 AM Scherrie Gerlach wrote: Reason for CRM: pt had to cancel her appt with Arnett on Friday because she has dialysis on Mon, Wed, Friday.  Her hospital fu was Friday with her pcp. Scheduled pt with Dr Linus Orn on Tues 3/05

## 2017-08-05 DIAGNOSIS — D631 Anemia in chronic kidney disease: Secondary | ICD-10-CM | POA: Diagnosis not present

## 2017-08-05 DIAGNOSIS — N2581 Secondary hyperparathyroidism of renal origin: Secondary | ICD-10-CM | POA: Diagnosis not present

## 2017-08-05 DIAGNOSIS — D509 Iron deficiency anemia, unspecified: Secondary | ICD-10-CM | POA: Diagnosis not present

## 2017-08-05 DIAGNOSIS — N186 End stage renal disease: Secondary | ICD-10-CM | POA: Diagnosis not present

## 2017-08-05 DIAGNOSIS — Z992 Dependence on renal dialysis: Secondary | ICD-10-CM | POA: Diagnosis not present

## 2017-08-06 ENCOUNTER — Other Ambulatory Visit: Payer: Self-pay | Admitting: Otolaryngology

## 2017-08-06 DIAGNOSIS — Z992 Dependence on renal dialysis: Secondary | ICD-10-CM | POA: Diagnosis not present

## 2017-08-06 DIAGNOSIS — E041 Nontoxic single thyroid nodule: Secondary | ICD-10-CM

## 2017-08-06 DIAGNOSIS — N186 End stage renal disease: Secondary | ICD-10-CM | POA: Diagnosis not present

## 2017-08-07 ENCOUNTER — Inpatient Hospital Stay: Payer: Medicare Other | Admitting: Family

## 2017-08-07 DIAGNOSIS — N2581 Secondary hyperparathyroidism of renal origin: Secondary | ICD-10-CM | POA: Diagnosis not present

## 2017-08-07 DIAGNOSIS — D509 Iron deficiency anemia, unspecified: Secondary | ICD-10-CM | POA: Diagnosis not present

## 2017-08-07 DIAGNOSIS — D631 Anemia in chronic kidney disease: Secondary | ICD-10-CM | POA: Diagnosis not present

## 2017-08-07 DIAGNOSIS — Z992 Dependence on renal dialysis: Secondary | ICD-10-CM | POA: Diagnosis not present

## 2017-08-07 DIAGNOSIS — N186 End stage renal disease: Secondary | ICD-10-CM | POA: Diagnosis not present

## 2017-08-10 DIAGNOSIS — D509 Iron deficiency anemia, unspecified: Secondary | ICD-10-CM | POA: Diagnosis not present

## 2017-08-10 DIAGNOSIS — Z992 Dependence on renal dialysis: Secondary | ICD-10-CM | POA: Diagnosis not present

## 2017-08-10 DIAGNOSIS — D631 Anemia in chronic kidney disease: Secondary | ICD-10-CM | POA: Diagnosis not present

## 2017-08-10 DIAGNOSIS — N2581 Secondary hyperparathyroidism of renal origin: Secondary | ICD-10-CM | POA: Diagnosis not present

## 2017-08-10 DIAGNOSIS — N186 End stage renal disease: Secondary | ICD-10-CM | POA: Diagnosis not present

## 2017-08-11 ENCOUNTER — Ambulatory Visit (INDEPENDENT_AMBULATORY_CARE_PROVIDER_SITE_OTHER): Payer: Medicare Other | Admitting: Internal Medicine

## 2017-08-11 ENCOUNTER — Encounter: Payer: Self-pay | Admitting: Internal Medicine

## 2017-08-11 ENCOUNTER — Other Ambulatory Visit: Payer: Self-pay

## 2017-08-11 ENCOUNTER — Telehealth: Payer: Self-pay | Admitting: Family

## 2017-08-11 ENCOUNTER — Telehealth: Payer: Self-pay

## 2017-08-11 VITALS — BP 144/64 | HR 79 | Temp 98.7°F | Ht 68.0 in | Wt 173.2 lb

## 2017-08-11 DIAGNOSIS — K635 Polyp of colon: Secondary | ICD-10-CM

## 2017-08-11 DIAGNOSIS — R059 Cough, unspecified: Secondary | ICD-10-CM

## 2017-08-11 DIAGNOSIS — R05 Cough: Secondary | ICD-10-CM

## 2017-08-11 DIAGNOSIS — I10 Essential (primary) hypertension: Secondary | ICD-10-CM | POA: Diagnosis not present

## 2017-08-11 DIAGNOSIS — N186 End stage renal disease: Secondary | ICD-10-CM | POA: Diagnosis not present

## 2017-08-11 DIAGNOSIS — Z1211 Encounter for screening for malignant neoplasm of colon: Secondary | ICD-10-CM

## 2017-08-11 DIAGNOSIS — E041 Nontoxic single thyroid nodule: Secondary | ICD-10-CM | POA: Diagnosis not present

## 2017-08-11 DIAGNOSIS — D631 Anemia in chronic kidney disease: Secondary | ICD-10-CM | POA: Diagnosis not present

## 2017-08-11 DIAGNOSIS — N185 Chronic kidney disease, stage 5: Secondary | ICD-10-CM

## 2017-08-11 NOTE — Patient Instructions (Addendum)
Try Mucinex DM (green label) and Robitussin DM for cough  Warm tea with honey and lemon We referred you to Dr. [name] for colonoscopy repeat  Take care     Anemia Anemia is a condition in which you do not have enough red blood cells or hemoglobin. Hemoglobin is a substance in red blood cells that carries oxygen. When you do not have enough red blood cells or hemoglobin (are anemic), your body cannot get enough oxygen and your organs may not work properly. As a result, you may feel very tired or have other problems. What are the causes? Common causes of anemia include:  Excessive bleeding. Anemia can be caused by excessive bleeding inside or outside the body, including bleeding from the intestine or from periods in women.  Poor nutrition.  Long-lasting (chronic) kidney, thyroid, and liver disease.  Bone marrow disorders.  Cancer and treatments for cancer.  HIV (human immunodeficiency virus) and AIDS (acquired immunodeficiency syndrome).  Treatments for HIV and AIDS.  Spleen problems.  Blood disorders.  Infections, medicines, and autoimmune disorders that destroy red blood cells.  What are the signs or symptoms? Symptoms of this condition include:  Minor weakness.  Dizziness.  Headache.  Feeling heartbeats that are irregular or faster than normal (palpitations).  Shortness of breath, especially with exercise.  Paleness.  Cold sensitivity.  Indigestion.  Nausea.  Difficulty sleeping.  Difficulty concentrating.  Symptoms may occur suddenly or develop slowly. If your anemia is mild, you may not have symptoms. How is this diagnosed? This condition is diagnosed based on:  Blood tests.  Your medical history.  A physical exam.  Bone marrow biopsy.  Your health care provider may also check your stool (feces) for blood and may do additional testing to look for the cause of your bleeding. You may also have other tests, including:  Imaging tests, such as  a CT scan or MRI.  Endoscopy.  Colonoscopy.  How is this treated? Treatment for this condition depends on the cause. If you continue to lose a lot of blood, you may need to be treated at a hospital. Treatment may include:  Taking supplements of iron, vitamin K87, or folic acid.  Taking a hormone medicine (erythropoietin) that can help to stimulate red blood cell growth.  Having a blood transfusion. This may be needed if you lose a lot of blood.  Making changes to your diet.  Having surgery to remove your spleen.  Follow these instructions at home:  Take over-the-counter and prescription medicines only as told by your health care provider.  Take supplements only as told by your health care provider.  Follow any diet instructions that you were given.  Keep all follow-up visits as told by your health care provider. This is important. Contact a health care provider if:  You develop new bleeding anywhere in the body. Get help right away if:  You are very weak.  You are short of breath.  You have pain in your abdomen or chest.  You are dizzy or feel faint.  You have trouble concentrating.  You have bloody or black, tarry stools.  You vomit repeatedly or you vomit up blood. Summary  Anemia is a condition in which you do not have enough red blood cells or enough of a substance in your red blood cells that carries oxygen (hemoglobin).  Symptoms may occur suddenly or develop slowly.  If your anemia is mild, you may not have symptoms.  This condition is diagnosed with blood tests  as well as a medical history and physical exam. Other tests may be needed.  Treatment for this condition depends on the cause of the anemia. This information is not intended to replace advice given to you by your health care provider. Make sure you discuss any questions you have with your health care provider. Document Released: 07/03/2004 Document Revised: 06/27/2016 Document Reviewed:  06/27/2016 Elsevier Interactive Patient Education  [employer].

## 2017-08-11 NOTE — Telephone Encounter (Signed)
Gastroenterology Pre-Procedure Review  Request Date: 08/20/17 Requesting Physician: Dr. Vicente Males  PATIENT REVIEW QUESTIONS: The patient responded to the following health history questions as indicated:    1. Are you having any GI issues? no 2. Do you have a personal history of Polyps? yes (Last year) 3. Do you have a family history of Colon Cancer or Polyps? yes (sister and father colon cancer) 4. Diabetes Mellitus? no 5. Joint replacements in the past 12 months?no 6. Major health problems in the past 3 months?yes (Influenza 2 weeks ago) 7. Any artificial heart valves, MVP, or defibrillator?no    MEDICATIONS & ALLERGIES:    Patient reports the following regarding taking any anticoagulation/antiplatelet therapy:   Plavix, Coumadin, Eliquis, Xarelto, Lovenox, Pradaxa, Brilinta, or Effient? 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 yes (81 mg)  Patient confirms/reports the following medications:  Current Outpatient Medications  Medication Sig Dispense Refill  . albuterol (PROVENTIL HFA;VENTOLIN HFA) 108 (90 BASE) MCG/ACT inhaler Inhale 2 puffs into the lungs every 6 (six) hours as needed for wheezing or shortness of breath.    Marland Kitchen albuterol (PROVENTIL) (2.5 MG/3ML) 0.083% nebulizer solution Take 2.5 mg by nebulization every 4 (four) hours as needed for wheezing or shortness of breath.    Marland Kitchen amLODipine (NORVASC) 10 MG tablet Take 10 mg by mouth daily.     Marland Kitchen aspirin EC 81 MG tablet Take 81 mg by mouth daily.    Marland Kitchen atorvastatin (LIPITOR) 40 MG tablet Take 1 tablet (40 mg total) by mouth daily. 90 tablet 3  . carvedilol (COREG) 12.5 MG tablet IF BLOOD PRESSURE GREATER THAN 160, TAKE 2 TABLETS BY MOUTH TWICE DAILY, OTHERWISE TAKE 1 TABLET BY MOUTH TWICE DAILY. 70 tablet 6  . cloNIDine (CATAPRES) 0.1 MG tablet Take 0.1 mg by mouth 2 (two) times daily.    . furosemide (LASIX) 80 MG tablet Take 80 mg by mouth daily.     . isosorbide mononitrate (IMDUR) 60 MG 24 hr tablet Take 1 tablet (60 mg  total) by mouth daily. 30 tablet 0  . losartan (COZAAR) 100 MG tablet Take 1 tablet (100 mg total) by mouth daily. 30 tablet 0  . Melatonin 10 MG TABS Take 10 mg by mouth daily as needed (SLEEP).     . multivitamin (RENA-VIT) TABS tablet TAKE ONE TABLET BY MOUTH ONCE DAILY 30 tablet 5  . nitroGLYCERIN (NITROSTAT) 0.4 MG SL tablet Place 1 tablet (0.4 mg total) under the tongue every 5 (five) minutes as needed for chest pain. 25 tablet 3  . pregabalin (LYRICA) 75 MG capsule Take 1 capsule (75 mg total) by mouth daily. 90 capsule 1   Current Facility-Administered Medications  Medication Dose Route Frequency Provider Last Rate Last Dose  . betamethasone acetate-betamethasone sodium phosphate (CELESTONE) injection 3 mg  3 mg Intramuscular Once Edrick Kins, DPM        Patient confirms/reports the following allergies:  Allergies  Allergen Reactions  . Hydrocodone Hives  . Metformin Diarrhea and Other (See Comments)    Other reaction(s): Distress (finding)  . Lisinopril     unknown    No orders of the defined types were placed in this encounter.   AUTHORIZATION INFORMATION Primary Insurance: 1D#: Group #:  Secondary Insurance: 1D#: Group #:  SCHEDULE INFORMATION: Date: 08/20/17 Time: Location:[facility]

## 2017-08-11 NOTE — Progress Notes (Signed)
Pre visit review using our clinic review tool, if applicable. No additional management support is needed unless otherwise documented below in the visit note. 

## 2017-08-11 NOTE — Telephone Encounter (Signed)
Please mail letter-   Hope you are well.   In reviewing your chart, it appears your are due for annual mammogram.  Please let us know if you would like for me to order. You may call the office to let us know, and we will order for you.   Once the order in the system, you may schedule at your preferred location.   Typically women have been using one of the sites below however you may go where you have been in the past. I would ensure that when you do get a mammogram that it is 3D ( as opposed to 2D which was prior technology). Evidence suggests that 3D is superior.   Please note that NOT all insurance companies cover 3D, and you may have to pay a higher copay. You may call your insurance company to further clarify your benefits.   Options for Mammogram in [place]:    [facility]  [address]  [place], [place]  [phone]   [facility] [address] [place],  [phone]   Let us know if you have questions.   My best,   Olden Klauer, NP   

## 2017-08-11 NOTE — Progress Notes (Signed)
Chief Complaint  Patient presents with  . Follow-up   HFU 2/17-2/19 for hypoxia 2/2 flu A despite having flu shot, CHF, and ESRD with overload. After hospitalization she got GI bug but feeling better now except for residual cough does not like cough drops so has not tried  HTN had meds this am about 8:30 148/56 repeat 144/64 at home getting 130s/50s and after HD 127/54 on Norvasc 10, coreg 12.5 bid, clonidine 0.1 bid, lasix 80, imdur 60 and cozaar 100 mg qd   She needs referral repeat colonoscopy last 1 year ago with multiple polpys.    Review of Systems  Constitutional: Negative for weight loss.  HENT: Negative for hearing loss.   Eyes: Negative for blurred vision.  Respiratory: Positive for cough. Negative for shortness of breath.   Cardiovascular: Negative for chest pain.  Gastrointestinal: Negative for blood in stool.  Musculoskeletal: Negative for falls.  Skin: Negative for rash.  Neurological: Negative for headaches.  Psychiatric/Behavioral: Negative for depression.   Past Medical History:  Diagnosis Date  . Anemia of chronic disease   . Carotid arterial disease ([place])    a. 02/2013 U/S: 40-59% bilat ICA stenosis - *f/u 02/2014*  . Chronic constipation   . Chronic diastolic CHF (congestive heart failure) ([place])    a. 10/2013 Echo [facility]): EF 55-60%, mod conc LVH, mod MR, mildly dil LA, mild Ao sclerosis w/o stenosis.  . Colon polyps   . COPD (chronic obstructive pulmonary disease) ([place])   . Coronary artery disease    a. 05/2013 NSTEMI/PCI: LM 20d, LAD min irregs, LCX small, nl, OM1 nl, RCA dom 16m(2.5x16 Promus DES), PDA1 80p.  . Diabetes mellitus   . Diabetic neuropathy ([place]   . Diabetic retinopathy ([place] 05/28/2013   Hx bilat retinal detachment, proliferative diab retinopathy and bilat vitreous hemorrhage   . Emphysema   . ESRD on hemodialysis ([place]    a. [facility] [phone]/Dr. [name], on a MWF schedule.  She started dialysis in Feb 2014.  Etiology of  renal failure not known, likely diabetes.  Has a left upper arm AV graft.  . History of bronchitis    Mar 2012  . History of pneumonia    June 2012  . History of tobacco abuse    a. Quit 2012.  .Marland KitchenHyperlipidemia   . Hypertension   . Moderate mitral insufficiency    a. 10/2013 Echo: EF 55-60%, mod MR.  . Myocardial infarct ([place] 05/2013  . Peripheral vascular disease ([place]   . Renal insufficiency   . Sickle cell trait ([place]   . Thyroid nodule    [place] due to f/u [place] 1 year    Past Surgical History:  Procedure Laterality Date  . ABDOMINAL HYSTERECTOMY     2000  . CARDIAC CATHETERIZATION    . CARDIAC CATHETERIZATION N/A 05/02/2015   Procedure: Left Heart Cath and Coronary Angiography;  Surgeon: MWellington Hampshire MD;  [facility];  Service: Cardiovascular;  Laterality: N/A;  . COLONOSCOPY WITH PROPOFOL N/A 07/08/2016   Procedure: COLONOSCOPY WITH PROPOFOL;  Surgeon: KJonathon Bellows MD;  Location: [facility];  Service: Endoscopy;  Laterality: N/A;  . colonscopy    . CORONARY ANGIOPLASTY  05/28/2014   stent placement to the mid RCA  . DILATION AND CURETTAGE OF UTERUS     several in the early 80's  . ESOPHAGOGASTRODUODENOSCOPY     2012  . EYE SURGERY     bilateral laser 2012  .  EYE SURGERY     right  . EYE SURGERY     x4 both eyes  . GAS INSERTION  09/30/2011   Procedure: INSERTION OF GAS;  Surgeon: [name], MD;  Location: [place];  Service: Ophthalmology;  Laterality: Right;  C3F8  . GAS/FLUID EXCHANGE  09/30/2011   Procedure: GAS/FLUID EXCHANGE;  Surgeon: [name], MD;  Location: [place];  Service: Ophthalmology;  Laterality: Right;  . LEFT HEART CATHETERIZATION WITH CORONARY ANGIOGRAM N/A 05/28/2013   Procedure: LEFT HEART CATHETERIZATION WITH CORONARY ANGIOGRAM;  Surgeon: [name], MD;  Location: [facility];  Service: Cardiovascular;  Laterality: N/A;  . PARS PLANA VITRECTOMY  04/22/2011   Procedure: PARS PLANA VITRECTOMY WITH 25  GAUGE;  Surgeon: [name], MD;  Location: [place];  Service: Ophthalmology;  Laterality: Left;  membrane peel, endolaser, gas fluid exchange, silicone oil, repair of complex traction retinal detachment  . PARS PLANA VITRECTOMY  09/30/2011   Procedure: PARS PLANA VITRECTOMY WITH 25 GAUGE;  Surgeon: [name], MD;  Location: [place];  Service: Ophthalmology;  Laterality: Right;  Endolaser; Repair of Complex Traction Retinal Detachment  . PARS PLANA VITRECTOMY  02/24/2012   Procedure: PARS PLANA VITRECTOMY WITH 25 GAUGE;  Surgeon: [name], MD;  Location: [place];  Service: Ophthalmology;  Laterality: Left;  . PTCA    . SILICON OIL REMOVAL  6/65/9935   Procedure: SILICON OIL REMOVAL;  Surgeon: [name], MD;  Location: [place];  Service: Ophthalmology;  Laterality: Left;  . THROMBECTOMY / ARTERIOVENOUS GRAFT REVISION    . TUBAL LIGATION     1979   Family History  Problem Relation Age of Onset  . Stroke Mother   . Heart attack Mother   . Heart disease Mother   . Colon cancer Father   . Colon cancer Sister   . Heart attack Brother   . Stroke Brother   . Diabetes Brother   . Breast cancer Sister   . Diabetes Brother   . Diabetes Daughter   . Anesthesia problems Neg Hx   . Hypotension Neg Hx   . Malignant hyperthermia Neg Hx   . Pseudochol deficiency Neg Hx    Social History   Socioeconomic History  . Marital status: Single    Spouse name: Not on file  . Number of children: 2  . Years of education: Not on file  . Highest education level: Not on file  Social Needs  . Financial resource strain: Not on file  . Food insecurity - worry: Not on file  . Food insecurity - inability: Not on file  . Transportation needs - medical: Not on file  . Transportation needs - non-medical: Not on file  Occupational History  . Not on file  Tobacco Use  . Smoking status: Former Smoker    Packs/day: 1.00    Years: 25.00    Pack years: 25.00    Last attempt to quit: 06/09/2010     Years since quitting: 7.1  . Smokeless tobacco: Never Used  Substance and Sexual Activity  . Alcohol use: No  . Drug use: No  . Sexual activity: No  Other Topics Concern  . Not on file  Social History Narrative   On disability.    Lives alone.    2 children      Son drives her since her vision has decreased   Current Meds  Medication Sig  . albuterol (PROVENTIL HFA;VENTOLIN HFA) 108 (  90 BASE) MCG/ACT inhaler Inhale 2 puffs into the lungs every 6 (six) hours as needed for wheezing or shortness of breath.  Marland Kitchen albuterol (PROVENTIL) (2.5 MG/3ML) 0.083% nebulizer solution Take 2.5 mg by nebulization every 4 (four) hours as needed for wheezing or shortness of breath.  Marland Kitchen amLODipine (NORVASC) 10 MG tablet Take 10 mg by mouth daily.   Marland Kitchen aspirin EC 81 MG tablet Take 81 mg by mouth daily.  Marland Kitchen atorvastatin (LIPITOR) 40 MG tablet Take 1 tablet (40 mg total) by mouth daily.  . carvedilol (COREG) 12.5 MG tablet IF BLOOD PRESSURE GREATER THAN 160, TAKE 2 TABLETS BY MOUTH TWICE DAILY, OTHERWISE TAKE 1 TABLET BY MOUTH TWICE DAILY.  . cloNIDine (CATAPRES) 0.1 MG tablet Take 0.1 mg by mouth 2 (two) times daily.  . furosemide (LASIX) 80 MG tablet Take 80 mg by mouth daily.   . isosorbide mononitrate (IMDUR) 60 MG 24 hr tablet Take 1 tablet (60 mg total) by mouth daily.  Marland Kitchen losartan (COZAAR) 100 MG tablet Take 1 tablet (100 mg total) by mouth daily.  . Melatonin 10 MG TABS Take 10 mg by mouth daily as needed (SLEEP).   . multivitamin (RENA-VIT) TABS tablet TAKE ONE TABLET BY MOUTH ONCE DAILY  . nitroGLYCERIN (NITROSTAT) 0.4 MG SL tablet Place 1 tablet (0.4 mg total) under the tongue every 5 (five) minutes as needed for chest pain.  . pregabalin (LYRICA) 75 MG capsule Take 1 capsule (75 mg total) by mouth daily.   Current Facility-Administered Medications for the 08/11/17 encounter (Office Visit) with McLean-Scocuzza, Nino Glow, MD  Medication  . betamethasone acetate-betamethasone sodium phosphate (CELESTONE)  injection 3 mg   Allergies  Allergen Reactions  . Hydrocodone Hives  . Metformin Diarrhea and Other (See Comments)    Other reaction(s): Distress (finding)  . Lisinopril     unknown   Recent Results (from the past 2160 hour(s))  VAS US CAROTID     Status: None   Collection Time: 06/04/17  1:43 PM  Result Value Ref Range   Right CCA prox sys -76 cm/s   Right CCA prox dias -12 cm/s   Right cca dist sys -121 cm/s   Left CCA prox sys 71 cm/s   Left CCA prox dias 9 cm/s   Left CCA dist sys -76 cm/s   Left CCA dist dias -13 cm/s   Left ICA prox sys -151 cm/s   Left ICA prox dias -27 cm/s   Left ICA dist sys 96 cm/s   Left ICA dist dias 18 cm/s   RIGHT VERTEBRAL DIAS -21.00 cm/s   LEFT VERTEBRAL DIAS 7.00 cm/s  Influenza panel by PCR (type A & B)     Status: Abnormal   Collection Time: 07/15/17  7:48 PM  Result Value Ref Range   Influenza A By PCR POSITIVE (A) NEGATIVE   Influenza B By PCR NEGATIVE NEGATIVE    Comment: (NOTE) The Xpert Xpress Flu assay is intended as an aid in the diagnosis of  influenza and should not be used as a sole basis for treatment.  This  assay is FDA approved for nasopharyngeal swab specimens only. Nasal  washings and aspirates are unacceptable for Xpert Xpress Flu testing. Performed at [facility], [address] [place], [place] [address]   CBC     Status: Abnormal   Collection Time: 07/15/17  7:48 PM  Result Value Ref Range   WBC 6.6 3.6 - 11.0 K/uL   RBC 2.77 (L) 3.80 -  5.20 MIL/uL   Hemoglobin 9.4 (L) 12.0 - 16.0 g/dL   HCT 27.0 (L) 35.0 - 47.0 %   MCV 97.7 80.0 - 100.0 fL   MCH 34.1 (H) 26.0 - 34.0 pg   MCHC 34.9 32.0 - 36.0 g/dL   RDW 14.6 (H) 11.5 - 14.5 %   Platelets 114 (L) 150 - 440 K/uL    Comment: Performed at [facility], [address] [place], [place] [address]  Comprehensive metabolic panel     Status: Abnormal   Collection Time: 07/15/17  7:48 PM  Result Value Ref Range   Sodium 137 135 - 145 mmol/L    Potassium 3.3 (L) 3.5 - 5.1 mmol/L   Chloride 95 (L) 101 - 111 mmol/L   CO2 29 22 - 32 mmol/L   Glucose, Bld 131 (H) 65 - 99 mg/dL   BUN 15 6 - 20 mg/dL   Creatinine, Ser 5.29 (H) 0.44 - 1.00 mg/dL   Calcium 8.7 (L) 8.9 - 10.3 mg/dL   Total Protein 6.8 6.5 - 8.1 g/dL   Albumin 3.7 3.5 - 5.0 g/dL   AST 35 15 - 41 U/L   ALT 26 14 - 54 U/L   Alkaline Phosphatase 89 38 - 126 U/L   Total Bilirubin 0.8 0.3 - 1.2 mg/dL   GFR calc non Af Amer 8 (L) >60 mL/min   GFR calc Af Amer 9 (L) >60 mL/min    Comment: (NOTE) The eGFR has been calculated using the CKD EPI equation. This calculation has not been validated in all clinical situations. eGFR's persistently <60 mL/min signify possible Chronic Kidney Disease.    Anion gap 13 5 - 15    Comment: Performed at [facility], [address] [place], [place] [address]  Lactic acid, plasma     Status: None   Collection Time: 07/15/17  7:48 PM  Result Value Ref Range   Lactic Acid, Venous 0.8 0.5 - 1.9 mmol/L    Comment: Performed at [facility], [address] [place], [place] [address]  CBC with Differential/Platelet     Status: Abnormal   Collection Time: 07/26/17  6:15 PM  Result Value Ref Range   WBC 8.5 3.6 - 11.0 K/uL   RBC 2.34 (L) 3.80 - 5.20 MIL/uL   Hemoglobin 7.9 (L) 12.0 - 16.0 g/dL   HCT 23.6 (L) 35.0 - 47.0 %   MCV 100.7 (H) 80.0 - 100.0 fL   MCH 33.8 26.0 - 34.0 pg   MCHC 33.6 32.0 - 36.0 g/dL   RDW 16.7 (H) 11.5 - 14.5 %   Platelets 226 150 - 440 K/uL   Neutrophils Relative % 82 %   Neutro Abs 7.0 (H) 1.4 - 6.5 K/uL   Lymphocytes Relative 10 %   Lymphs Abs 0.8 (L) 1.0 - 3.6 K/uL   Monocytes Relative 5 %   Monocytes Absolute 0.4 0.2 - 0.9 K/uL   Eosinophils Relative 2 %   Eosinophils Absolute 0.2 0 - 0.7 K/uL   Basophils Relative 1 %   Basophils Absolute 0.1 0 - 0.1 K/uL    Comment: Performed at [facility], [address] [place], [place] [address]  Comprehensive metabolic panel      Status: Abnormal   Collection Time: 07/26/17  6:15 PM  Result Value Ref Range   Sodium 140 135 - 145 mmol/L   Potassium 3.6 3.5 - 5.1 mmol/L   Chloride 101 101 - 111 mmol/L   CO2 29  22 - 32 mmol/L   Glucose, Bld 131 (H) 65 - 99 mg/dL   BUN 30 (H) 6 - 20 mg/dL   Creatinine, Ser 10.08 (H) 0.44 - 1.00 mg/dL   Calcium 9.2 8.9 - 10.3 mg/dL   Total Protein 6.4 (L) 6.5 - 8.1 g/dL   Albumin 3.2 (L) 3.5 - 5.0 g/dL   AST 17 15 - 41 U/L   ALT 13 (L) 14 - 54 U/L   Alkaline Phosphatase 86 38 - 126 U/L   Total Bilirubin 0.7 0.3 - 1.2 mg/dL   GFR calc non Af Amer 4 (L) >60 mL/min   GFR calc Af Amer 4 (L) >60 mL/min    Comment: (NOTE) The eGFR has been calculated using the CKD EPI equation. This calculation has not been validated in all clinical situations. eGFR's persistently <60 mL/min signify possible Chronic Kidney Disease.    Anion gap 10 5 - 15    Comment: Performed at [facility], [address] [place], [place] [address]  Troponin I     Status: Abnormal   Collection Time: 07/26/17  6:15 PM  Result Value Ref Range   Troponin I 0.06 (HH) <0.03 ng/mL    Comment: CRITICAL RESULT CALLED TO, READ BACK BY AND VERIFIED WITH KATE BUMGARNER ON 07/26/17 AT 2003 [facility] Performed at [facility], [address] [place], [place] [address]   Brain natriuretic peptide     Status: Abnormal   Collection Time: 07/26/17  6:15 PM  Result Value Ref Range   B Natriuretic Peptide 1,436.0 (H) 0.0 - 100.0 pg/mL    Comment: Performed at [facility], [address] [place], [place] [address]  Glucose, capillary     Status: Abnormal   Collection Time: 07/26/17  9:23 PM  Result Value Ref Range   Glucose-Capillary 111 (H) 65 - 99 mg/dL  MRSA PCR Screening     Status: None   Collection Time: 07/27/17 12:29 AM  Result Value Ref Range   MRSA by PCR NEGATIVE NEGATIVE    Comment:        The GeneXpert MRSA Assay (FDA approved for NASAL specimens only), is one component of  a comprehensive MRSA colonization surveillance program. It is not intended to diagnose MRSA infection nor to guide or monitor treatment for MRSA infections. Performed at [facility], [address] [place], [place] [address]   HIV antibody (Routine Testing)     Status: None   Collection Time: 07/27/17  4:55 AM  Result Value Ref Range   HIV Screen 4th Generation wRfx Non Reactive Non Reactive    Comment: (NOTE) Performed At: [facility] [place], [place] [id number] Rush Farmer MD [phone] Performed at [facility], [address] [place], [place] [address]   Basic metabolic panel     Status: Abnormal   Collection Time: 07/27/17  4:55 AM  Result Value Ref Range   Sodium 141 135 - 145 mmol/L   Potassium 3.6 3.5 - 5.1 mmol/L   Chloride 101 101 - 111 mmol/L   CO2 27 22 - 32 mmol/L   Glucose, Bld 104 (H) 65 - 99 mg/dL   BUN 33 (H) 6 - 20 mg/dL   Creatinine, Ser 10.74 (H) 0.44 - 1.00 mg/dL   Calcium 8.7 (L) 8.9 - 10.3 mg/dL   GFR calc non Af Amer 3 (L) >60 mL/min   GFR calc Af Amer 4 (L) >60 mL/min    Comment: (NOTE) The eGFR  has been calculated using the CKD EPI equation. This calculation has not been validated in all clinical situations. eGFR's persistently <60 mL/min signify possible Chronic Kidney Disease.    Anion gap 13 5 - 15    Comment: Performed at [facility], [address] [place], [place] [address]  Glucose, capillary     Status: Abnormal   Collection Time: 07/27/17  7:30 AM  Result Value Ref Range   Glucose-Capillary 105 (H) 65 - 99 mg/dL  Glucose, capillary     Status: Abnormal   Collection Time: 07/27/17  4:51 PM  Result Value Ref Range   Glucose-Capillary 151 (H) 65 - 99 mg/dL  Glucose, capillary     Status: None   Collection Time: 07/27/17  8:54 PM  Result Value Ref Range   Glucose-Capillary 82 65 - 99 mg/dL  CBC     Status: Abnormal   Collection Time: 07/28/17  3:59 AM  Result Value  Ref Range   WBC 7.3 3.6 - 11.0 K/uL   RBC 2.24 (L) 3.80 - 5.20 MIL/uL   Hemoglobin 7.7 (L) 12.0 - 16.0 g/dL   HCT 22.4 (L) 35.0 - 47.0 %   MCV 100.2 (H) 80.0 - 100.0 fL   MCH 34.3 (H) 26.0 - 34.0 pg   MCHC 34.2 32.0 - 36.0 g/dL   RDW 16.2 (H) 11.5 - 14.5 %   Platelets 226 150 - 440 K/uL    Comment: Performed at [facility], [address] [place], [place] [address]  Basic metabolic panel     Status: Abnormal   Collection Time: 07/28/17  3:59 AM  Result Value Ref Range   Sodium 137 135 - 145 mmol/L   Potassium 3.7 3.5 - 5.1 mmol/L   Chloride 99 (L) 101 - 111 mmol/L   CO2 30 22 - 32 mmol/L   Glucose, Bld 83 65 - 99 mg/dL   BUN 12 6 - 20 mg/dL   Creatinine, Ser 5.83 (H) 0.44 - 1.00 mg/dL   Calcium 8.8 (L) 8.9 - 10.3 mg/dL   GFR calc non Af Amer 7 (L) >60 mL/min   GFR calc Af Amer 8 (L) >60 mL/min    Comment: (NOTE) The eGFR has been calculated using the CKD EPI equation. This calculation has not been validated in all clinical situations. eGFR's persistently <60 mL/min signify possible Chronic Kidney Disease.    Anion gap 8 5 - 15    Comment: Performed at [facility], [address] [place], [place] [address]  Glucose, capillary     Status: None   Collection Time: 07/28/17  7:36 AM  Result Value Ref Range   Glucose-Capillary 86 65 - 99 mg/dL   Objective  Body mass index is 26.33 kg/m. Wt Readings from Last 3 Encounters:  08/11/17 173 lb 3.2 oz (78.6 kg)  07/28/17 168 lb 6.9 oz (76.4 kg)  07/15/17 174 lb (78.9 kg)   Temp Readings from Last 3 Encounters:  08/11/17 98.7 F (37.1 C) (Oral)  07/28/17 98.6 F (37 C) (Oral)  07/15/17 99.2 F (37.3 C) (Oral)   BP Readings from Last 3 Encounters:  08/11/17 (!) 144/64  07/28/17 (!) 187/58  07/15/17 (!) 188/63   Pulse Readings from Last 3 Encounters:  08/11/17 79  07/28/17 79  07/15/17 78   O2 sat room air 94%  Physical Exam  Constitutional: She is oriented to person, place, and time and  well-developed, well-nourished, and in no distress.  HENT:  Head: Normocephalic and  atraumatic.  Mouth/Throat: Oropharynx is clear and moist and mucous membranes are normal.  Eyes: Conjunctivae are normal. Pupils are equal, round, and reactive to light.  Cardiovascular: Normal rate and regular rhythm.  Murmur heard. Pulmonary/Chest: Effort normal and breath sounds normal.  Neurological: She is alert and oriented to person, place, and time. Gait normal. Gait normal.  Skin: Skin is warm, dry and intact.  Psychiatric: Mood, memory, affect and judgment normal.  Nursing note and vitals reviewed.   Assessment   1. Cough s/p hosp for flu  2. HTN 3. Thyroid nodules  4. Anemia likely CKD 5. CKD on HD MWF  6. H/o multiple colon polyps  7. HM Plan  1. Disc tea with honey and lemon, mucinex dm/robitussin dm pt does not want to try cough drops  2 cont meds for now and monitor BP Check lipid with HD tomorrow given Rx  3. Check TSH, free T4, free T3 at HD tomorrow given Rx  Needs repeat thyroid US 06/16/2018   4. Dr. Holley Raring if primary renal MD monitoring anemia last hbg 7.9 when spoke with HD RN davita today [phone]  this was 2/25 and they increased her epogen yesterday to 10K units and will monitor anemia and iron levels with HD  5.  Called and spoke with [facility] HD RN today above above  She will see [facility] 09/03/17 per pt for renal transplant consultation and pancreas issue  6. Referred back to GI Dr. Bailey Mech needs repeat colonoscopy  7. Had flu shot  pna 23 had  Tetanus UTD  Hep B immune  Consider shingrix in future   Pap neg 11/13/16 neg HPV  mammo neg 01/13/17  Referred again for colonoscopy h/o multiple polyps  Former smoker x 25-30 years <1 ppd  Does not make urine  Last A1C 4.8 06/27/17 with HD prior to taht 03/2017 5.6 per HD RN today    Provider: Dr. Olivia Mackie McLean-Scocuzza-Internal Medicine

## 2017-08-12 DIAGNOSIS — D509 Iron deficiency anemia, unspecified: Secondary | ICD-10-CM | POA: Diagnosis not present

## 2017-08-12 DIAGNOSIS — N186 End stage renal disease: Secondary | ICD-10-CM | POA: Diagnosis not present

## 2017-08-12 DIAGNOSIS — N2581 Secondary hyperparathyroidism of renal origin: Secondary | ICD-10-CM | POA: Diagnosis not present

## 2017-08-12 DIAGNOSIS — Z992 Dependence on renal dialysis: Secondary | ICD-10-CM | POA: Diagnosis not present

## 2017-08-12 DIAGNOSIS — E039 Hypothyroidism, unspecified: Secondary | ICD-10-CM | POA: Diagnosis not present

## 2017-08-12 DIAGNOSIS — E785 Hyperlipidemia, unspecified: Secondary | ICD-10-CM | POA: Diagnosis not present

## 2017-08-12 DIAGNOSIS — D631 Anemia in chronic kidney disease: Secondary | ICD-10-CM | POA: Diagnosis not present

## 2017-08-13 NOTE — Telephone Encounter (Signed)
Letter printed and mailed.  

## 2017-08-14 DIAGNOSIS — N2581 Secondary hyperparathyroidism of renal origin: Secondary | ICD-10-CM | POA: Diagnosis not present

## 2017-08-14 DIAGNOSIS — N186 End stage renal disease: Secondary | ICD-10-CM | POA: Diagnosis not present

## 2017-08-14 DIAGNOSIS — D631 Anemia in chronic kidney disease: Secondary | ICD-10-CM | POA: Diagnosis not present

## 2017-08-14 DIAGNOSIS — Z992 Dependence on renal dialysis: Secondary | ICD-10-CM | POA: Diagnosis not present

## 2017-08-14 DIAGNOSIS — D509 Iron deficiency anemia, unspecified: Secondary | ICD-10-CM | POA: Diagnosis not present

## 2017-08-17 DIAGNOSIS — D509 Iron deficiency anemia, unspecified: Secondary | ICD-10-CM | POA: Diagnosis not present

## 2017-08-17 DIAGNOSIS — N2581 Secondary hyperparathyroidism of renal origin: Secondary | ICD-10-CM | POA: Diagnosis not present

## 2017-08-17 DIAGNOSIS — Z992 Dependence on renal dialysis: Secondary | ICD-10-CM | POA: Diagnosis not present

## 2017-08-17 DIAGNOSIS — D631 Anemia in chronic kidney disease: Secondary | ICD-10-CM | POA: Diagnosis not present

## 2017-08-17 DIAGNOSIS — N186 End stage renal disease: Secondary | ICD-10-CM | POA: Diagnosis not present

## 2017-08-19 ENCOUNTER — Encounter: Admit: 2017-08-19 | Discharge: 2017-08-19 | Payer: MEDICARE | Attending: Nephrology | Primary: Nephrology

## 2017-08-19 DIAGNOSIS — D631 Anemia in chronic kidney disease: Secondary | ICD-10-CM | POA: Diagnosis not present

## 2017-08-19 DIAGNOSIS — N186 End stage renal disease: Secondary | ICD-10-CM | POA: Diagnosis not present

## 2017-08-19 DIAGNOSIS — Z992 Dependence on renal dialysis: Secondary | ICD-10-CM | POA: Diagnosis not present

## 2017-08-19 DIAGNOSIS — N2581 Secondary hyperparathyroidism of renal origin: Secondary | ICD-10-CM | POA: Diagnosis not present

## 2017-08-19 DIAGNOSIS — D509 Iron deficiency anemia, unspecified: Secondary | ICD-10-CM | POA: Diagnosis not present

## 2017-08-20 ENCOUNTER — Other Ambulatory Visit: Payer: Self-pay

## 2017-08-20 ENCOUNTER — Ambulatory Visit
Admission: RE | Admit: 2017-08-20 | Discharge: 2017-08-20 | Disposition: A | Discharge status: Home / Self Care | Payer: Medicare Other | Source: Ambulatory Visit | Attending: Gastroenterology | Admitting: Gastroenterology

## 2017-08-20 ENCOUNTER — Ambulatory Visit: Payer: Medicare Other | Admitting: Anesthesiology

## 2017-08-20 ENCOUNTER — Encounter: Payer: Self-pay | Admitting: *Deleted

## 2017-08-20 ENCOUNTER — Encounter
Admission: RE | Disposition: A | Discharge status: Home / Self Care | Payer: Self-pay | Source: Ambulatory Visit | Attending: Gastroenterology

## 2017-08-20 DIAGNOSIS — I2510 Atherosclerotic heart disease of native coronary artery without angina pectoris: Secondary | ICD-10-CM | POA: Diagnosis not present

## 2017-08-20 DIAGNOSIS — D122 Benign neoplasm of ascending colon: Secondary | ICD-10-CM | POA: Diagnosis not present

## 2017-08-20 DIAGNOSIS — D573 Sickle-cell trait: Secondary | ICD-10-CM | POA: Insufficient documentation

## 2017-08-20 DIAGNOSIS — D123 Benign neoplasm of transverse colon: Secondary | ICD-10-CM

## 2017-08-20 DIAGNOSIS — I5032 Chronic diastolic (congestive) heart failure: Secondary | ICD-10-CM | POA: Diagnosis not present

## 2017-08-20 DIAGNOSIS — N186 End stage renal disease: Secondary | ICD-10-CM | POA: Insufficient documentation

## 2017-08-20 DIAGNOSIS — D126 Benign neoplasm of colon, unspecified: Secondary | ICD-10-CM | POA: Diagnosis not present

## 2017-08-20 DIAGNOSIS — Z955 Presence of coronary angioplasty implant and graft: Secondary | ICD-10-CM | POA: Diagnosis not present

## 2017-08-20 DIAGNOSIS — Z86010 Personal history of colonic polyps: Secondary | ICD-10-CM | POA: Diagnosis not present

## 2017-08-20 DIAGNOSIS — E785 Hyperlipidemia, unspecified: Secondary | ICD-10-CM | POA: Insufficient documentation

## 2017-08-20 DIAGNOSIS — E1140 Type 2 diabetes mellitus with diabetic neuropathy, unspecified: Secondary | ICD-10-CM | POA: Insufficient documentation

## 2017-08-20 DIAGNOSIS — Z1211 Encounter for screening for malignant neoplasm of colon: Secondary | ICD-10-CM | POA: Insufficient documentation

## 2017-08-20 DIAGNOSIS — Z87891 Personal history of nicotine dependence: Secondary | ICD-10-CM | POA: Insufficient documentation

## 2017-08-20 DIAGNOSIS — I252 Old myocardial infarction: Secondary | ICD-10-CM | POA: Diagnosis not present

## 2017-08-20 DIAGNOSIS — E1122 Type 2 diabetes mellitus with diabetic chronic kidney disease: Secondary | ICD-10-CM | POA: Diagnosis not present

## 2017-08-20 DIAGNOSIS — Z79899 Other long term (current) drug therapy: Secondary | ICD-10-CM | POA: Diagnosis not present

## 2017-08-20 DIAGNOSIS — Z992 Dependence on renal dialysis: Secondary | ICD-10-CM | POA: Diagnosis not present

## 2017-08-20 DIAGNOSIS — E11319 Type 2 diabetes mellitus with unspecified diabetic retinopathy without macular edema: Secondary | ICD-10-CM | POA: Insufficient documentation

## 2017-08-20 DIAGNOSIS — E1151 Type 2 diabetes mellitus with diabetic peripheral angiopathy without gangrene: Secondary | ICD-10-CM | POA: Diagnosis not present

## 2017-08-20 DIAGNOSIS — D124 Benign neoplasm of descending colon: Secondary | ICD-10-CM

## 2017-08-20 DIAGNOSIS — I132 Hypertensive heart and chronic kidney disease with heart failure and with stage 5 chronic kidney disease, or end stage renal disease: Secondary | ICD-10-CM | POA: Insufficient documentation

## 2017-08-20 DIAGNOSIS — K635 Polyp of colon: Secondary | ICD-10-CM | POA: Diagnosis not present

## 2017-08-20 DIAGNOSIS — Z7984 Long term (current) use of oral hypoglycemic drugs: Secondary | ICD-10-CM | POA: Insufficient documentation

## 2017-08-20 DIAGNOSIS — J449 Chronic obstructive pulmonary disease, unspecified: Secondary | ICD-10-CM | POA: Insufficient documentation

## 2017-08-20 DIAGNOSIS — I25118 Atherosclerotic heart disease of native coronary artery with other forms of angina pectoris: Secondary | ICD-10-CM | POA: Diagnosis not present

## 2017-08-20 HISTORY — PX: COLONOSCOPY WITH PROPOFOL: SHX5780

## 2017-08-20 SURGERY — COLONOSCOPY WITH PROPOFOL
Anesthesia: General

## 2017-08-20 MED ORDER — FENTANYL CITRATE (PF) 100 MCG/2ML IJ SOLN
INTRAMUSCULAR | Status: DC | PRN
  Administered 2017-08-20: 50 ug via INTRAVENOUS
  Administered 2017-08-20 (×2): 25 ug via INTRAVENOUS

## 2017-08-20 MED ORDER — MIDAZOLAM HCL 2 MG/2ML IJ SOLN
INTRAMUSCULAR | Status: AC
  Filled 2017-08-20: qty 2

## 2017-08-20 MED ORDER — PROPOFOL 500 MG/50ML IV EMUL
INTRAVENOUS | Status: AC
  Filled 2017-08-20: qty 50

## 2017-08-20 MED ORDER — FENTANYL CITRATE (PF) 100 MCG/2ML IJ SOLN
INTRAMUSCULAR | Status: AC
  Filled 2017-08-20: qty 2

## 2017-08-20 MED ORDER — PROPOFOL 500 MG/50ML IV EMUL
INTRAVENOUS | Status: DC | PRN
  Administered 2017-08-20: 120 ug/kg/min via INTRAVENOUS

## 2017-08-20 MED ORDER — MIDAZOLAM HCL 2 MG/2ML IJ SOLN
INTRAMUSCULAR | Status: DC | PRN
  Administered 2017-08-20 (×2): 1 mg via INTRAVENOUS

## 2017-08-20 MED ORDER — LIDOCAINE HCL (PF) 1 % IJ SOLN
INTRAMUSCULAR | Status: AC
  Filled 2017-08-20: qty 2

## 2017-08-20 MED ORDER — SODIUM CHLORIDE 0.9 % IV SOLN
INTRAVENOUS | Status: DC
  Administered 2017-08-20: 10:00:00 via INTRAVENOUS

## 2017-08-20 NOTE — Anesthesia Preprocedure Evaluation (Signed)
Anesthesia Evaluation  Patient identified by MRN, date of birth, ID band Patient awake    Reviewed: Allergy & Precautions, H&P , NPO status , Patient's Chart, lab work & pertinent test results, reviewed documented beta blocker date and time   History of Anesthesia Complications Negative for: history of anesthetic complications  Airway Mallampati: III  TM Distance: >3 FB Neck ROM: full    Dental  (+) Poor Dentition, Missing   Pulmonary neg shortness of breath, neg sleep apnea, COPD, neg recent URI, former smoker,           Cardiovascular Exercise Tolerance: Good hypertension, On Medications (-) angina+ CAD, + Past MI, + Cardiac Stents, + Peripheral Vascular Disease and +CHF  (-) CABG (-) dysrhythmias + Valvular Problems/Murmurs      Neuro/Psych neg Seizures  Neuromuscular disease negative psych ROS   GI/Hepatic negative GI ROS, Neg liver ROS,   Endo/Other  diabetes, Well Controlled  Renal/GU ESRF and DialysisRenal disease  negative genitourinary   Musculoskeletal   Abdominal   Peds  Hematology  (+) Blood dyscrasia, Sickle cell trait and anemia ,   Anesthesia Other Findings Past Medical History: No date: Anemia of chronic disease No date: Carotid arterial disease ([place])     Comment: a. 02/2013 U/S: 40-59% bilat ICA stenosis -               *f/u 02/2014* No date: Chronic constipation No date: Chronic diastolic CHF (congestive heart failur*     Comment: a. 10/2013 Echo [facility]): EF 55-60%, mod conc               LVH, mod MR, mildly dil LA, mild Ao sclerosis               w/o stenosis. No date: Colon polyps No date: COPD (chronic obstructive pulmonary disease) (* No date: Coronary artery disease     Comment: a. 05/2013 NSTEMI/PCI: LM 20d, LAD min irregs,              LCX small, nl, OM1 nl, RCA dom 39m (2.5x16               Promus DES), PDA1 80p. No date: Diabetes mellitus No date: Diabetic neuropathy ([place]) 05/28/2013:  Diabetic retinopathy ([place])     Comment: Hx bilat retinal detachment, proliferative               diab retinopathy and bilat vitreous hemorrhage  No date: Emphysema No date: ESRD on hemodialysis ([place])     Comment: a. [facility] in [place], [place], on a TTS               schedule.  She started dialysis in Feb 2014.                Etiology of renal failure not known, likely               diabetes.  Has a left upper arm AV graft. No date: History of bronchitis     Comment: Mar 2012 No date: History of pneumonia     Comment: June 2012 No date: History of tobacco abuse     Comment: a. Quit 2012. No date: Hyperlipidemia No date: Hypertension No date: Moderate mitral insufficiency     Comment: a. 10/2013 Echo: EF 55-60%, mod MR. No date: Peripheral vascular disease ([place]) No date: Sickle cell trait ([facility])   Reproductive/Obstetrics negative OB ROS  Anesthesia Physical  Anesthesia Plan  ASA: IV  Anesthesia Plan: General   Post-op Pain Management:    Induction: Intravenous  PONV Risk Score and Plan: 3 and Propofol infusion  Airway Management Planned: Nasal Cannula and Natural Airway  Additional Equipment:   Intra-op Plan:   Post-operative Plan:   Informed Consent: I have reviewed the patients History and Physical, chart, labs and discussed the procedure including the risks, benefits and alternatives for the proposed anesthesia with the patient or authorized representative who has indicated his/her understanding and acceptance.   Dental Advisory Given  Plan Discussed with: Anesthesiologist, CRNA and Surgeon  Anesthesia Plan Comments:         Anesthesia Quick Evaluation

## 2017-08-20 NOTE — Anesthesia Postprocedure Evaluation (Signed)
Anesthesia Post Note  Patient: Theresa [name]  Procedure(s) Performed: COLONOSCOPY WITH PROPOFOL (N/A )  Patient location during evaluation: Endoscopy Anesthesia Type: General Level of consciousness: awake and alert and oriented Pain management: pain level controlled Vital Signs Assessment: post-procedure vital signs reviewed and stable Respiratory status: spontaneous breathing, nonlabored ventilation and respiratory function stable Cardiovascular status: blood pressure returned to baseline and stable Postop Assessment: no signs of nausea or vomiting Anesthetic complications: no     Last Vitals:  Vitals:   08/20/17 1127 08/20/17 1147  BP: (!) 181/79 (!) 210/87  Pulse:    Resp: 15   Temp: (!) 36.3 C   SpO2:      Last Pain:  Vitals:   08/20/17 1147  TempSrc: Tympanic                 Brandonlee Navis

## 2017-08-20 NOTE — Transfer of Care (Signed)
   Immediate Anesthesia Transfer of Care Note  Patient: Theresa [name]  Procedure(s) Performed: COLONOSCOPY WITH PROPOFOL (N/A )  Patient Location: PACU  Anesthesia Type:General  Level of Consciousness: awake and sedated  Airway & Oxygen Therapy: Patient Spontanous Breathing and Patient connected to face mask oxygen  Post-op Assessment: Report given to RN and Post -op Vital signs reviewed and stable  Post vital signs: Reviewed and stable  Last Vitals:  Vitals:   08/20/17 0922  BP: (!) 218/72  Pulse: 83  Resp: 20  Temp: (!) 36.2 C  SpO2: 100%    Last Pain:  Vitals:   08/20/17 0922  TempSrc: Tympanic         Complications: No apparent anesthesia complications

## 2017-08-20 NOTE — Anesthesia Procedure Notes (Signed)
Performed by: Cook-Martin, Danon Lograsso Pre-anesthesia Checklist: Patient identified, Emergency Drugs available, Suction available, Patient being monitored and Timeout performed Patient Re-evaluated:Patient Re-evaluated prior to induction Oxygen Delivery Method: Simple face mask Preoxygenation: Pre-oxygenation with 100% oxygen Induction Type: IV induction Ventilation: Oral airway inserted - appropriate to patient size Placement Confirmation: positive ETCO2 and CO2 detector       

## 2017-08-20 NOTE — H&P (Signed)
[name], MD [address], [place], [place], [place], [address] [address], [place], [place], [place], [address] Phone: [phone]  Fax: [phone]  Primary Care Physician:  Burnard Hawthorne, FNP   Pre-Procedure History & Physical: HPI:  Theresa [name] is a 62 y.o. female is here for an colonoscopy.   Past Medical History:  Diagnosis Date  . Anemia of chronic disease   . Carotid arterial disease ([place])    a. 02/2013 U/S: 40-59% bilat ICA stenosis - *f/u 02/2014*  . Chronic constipation   . Chronic diastolic CHF (congestive heart failure) ([place])    a. 10/2013 Echo [facility]): EF 55-60%, mod conc LVH, mod MR, mildly dil LA, mild Ao sclerosis w/o stenosis.  . Colon polyps   . COPD (chronic obstructive pulmonary disease) ([place])   . Coronary artery disease    a. 05/2013 NSTEMI/PCI: LM 20d, LAD min irregs, LCX small, nl, OM1 nl, RCA dom 66m (2.5x16 Promus DES), PDA1 80p.  . Diabetes mellitus   . Diabetic neuropathy ([place])   . Diabetic retinopathy ([place]) 05/28/2013   Hx bilat retinal detachment, proliferative diab retinopathy and bilat vitreous hemorrhage   . Emphysema   . ESRD on hemodialysis ([place])    a. [facility] in [place], [place] [phone]/Dr. [name], on a MWF schedule.  She started dialysis in Feb 2014.  Etiology of renal failure not known, likely diabetes.  Has a left upper arm AV graft.  . History of bronchitis    Mar 2012  . History of pneumonia    June 2012  . History of tobacco abuse    a. Quit 2012.  Marland Kitchen Hyperlipidemia   . Hypertension   . Moderate mitral insufficiency    a. 10/2013 Echo: EF 55-60%, mod MR.  . Myocardial infarct ([place]) 05/2013  . Peripheral vascular disease ([place])   . Renal insufficiency   . Sickle cell trait ([place])   . Thyroid nodule    [place] 06/2017 due to f/u US in 1 year     Past Surgical History:  Procedure Laterality Date  . ABDOMINAL HYSTERECTOMY     2000  . CARDIAC CATHETERIZATION    . CARDIAC CATHETERIZATION N/A 05/02/2015   Procedure:  Left Heart Cath and Coronary Angiography;  Surgeon: [name], MD;  Location: [facility];  Service: Cardiovascular;  Laterality: N/A;  . COLONOSCOPY WITH PROPOFOL N/A 07/08/2016   Procedure: COLONOSCOPY WITH PROPOFOL;  Surgeon: [name], MD;  Location: [facility] ENDOSCOPY;  Service: Endoscopy;  Laterality: N/A;  . colonscopy    . CORONARY ANGIOPLASTY  05/28/2014   stent placement to the mid RCA  . DILATION AND CURETTAGE OF UTERUS     several in the early 80's  . ESOPHAGOGASTRODUODENOSCOPY     2012  . EYE SURGERY     bilateral laser 2012  . EYE SURGERY     right  . EYE SURGERY     x4 both eyes  . GAS INSERTION  09/30/2011   Procedure: INSERTION OF GAS;  Surgeon: [name], MD;  Location: [facility];  Service: Ophthalmology;  Laterality: Right;  C3F8  . GAS/FLUID EXCHANGE  09/30/2011   Procedure: GAS/FLUID EXCHANGE;  Surgeon: [name], MD;  Location: [facility];  Service: Ophthalmology;  Laterality: Right;  . LEFT HEART CATHETERIZATION WITH CORONARY ANGIOGRAM N/A 05/28/2013   Procedure: LEFT HEART CATHETERIZATION WITH CORONARY ANGIOGRAM;  Surgeon: [name], MD;  Location: [facility] CATH LAB;  Service: Cardiovascular;  Laterality: N/A;  .  PARS PLANA VITRECTOMY  04/22/2011   Procedure: PARS PLANA VITRECTOMY WITH 25 GAUGE;  Surgeon: [name], MD;  Location: [facility];  Service: Ophthalmology;  Laterality: Left;  membrane peel, endolaser, gas fluid exchange, silicone oil, repair of complex traction retinal detachment  . PARS PLANA VITRECTOMY  09/30/2011   Procedure: PARS PLANA VITRECTOMY WITH 25 GAUGE;  Surgeon: [name], MD;  Location: [facility];  Service: Ophthalmology;  Laterality: Right;  Endolaser; Repair of Complex Traction Retinal Detachment  . PARS PLANA VITRECTOMY  02/24/2012   Procedure: PARS PLANA VITRECTOMY WITH 25 GAUGE;  Surgeon: [name], MD;  Location: [facility];  Service: Ophthalmology;  Laterality: Left;  . PTCA    . SILICON OIL REMOVAL  3/50/0938    Procedure: SILICON OIL REMOVAL;  Surgeon: [name], MD;  Location: [facility];  Service: Ophthalmology;  Laterality: Left;  . THROMBECTOMY / ARTERIOVENOUS GRAFT REVISION    . TUBAL LIGATION     1979    Prior to Admission medications   Medication Sig Start Date End Date Taking? Authorizing Provider  albuterol (PROVENTIL HFA;VENTOLIN HFA) 108 (90 BASE) MCG/ACT inhaler Inhale 2 puffs into the lungs every 6 (six) hours as needed for wheezing or shortness of breath.   Yes [provider]  albuterol (PROVENTIL) (2.5 MG/3ML) 0.083% nebulizer solution Take 2.5 mg by nebulization every 4 (four) hours as needed for wheezing or shortness of breath.   Yes [provider]  amLODipine (NORVASC) 10 MG tablet Take 10 mg by mouth daily.  09/17/16  Yes [provider]  aspirin EC 81 MG tablet Take 81 mg by mouth daily.   Yes [provider]  atorvastatin (LIPITOR) 40 MG tablet Take 1 tablet (40 mg total) by mouth daily. 07/03/16 03/11/18 Yes Hackney, Tina A, FNP  carvedilol (COREG) 12.5 MG tablet IF BLOOD PRESSURE GREATER THAN 160, TAKE 2 TABLETS BY MOUTH TWICE DAILY, OTHERWISE TAKE 1 TABLET BY MOUTH TWICE DAILY. 03/02/17  Yes Darylene Price A, FNP  cloNIDine (CATAPRES) 0.1 MG tablet Take 0.1 mg by mouth 2 (two) times daily.   Yes [provider]  furosemide (LASIX) 80 MG tablet Take 80 mg by mouth daily.    Yes [provider]  isosorbide mononitrate (IMDUR) 60 MG 24 hr tablet Take 1 tablet (60 mg total) by mouth daily. 07/28/17  Yes Max Sane, MD  losartan (COZAAR) 100 MG tablet Take 1 tablet (100 mg total) by mouth daily. 07/28/17  Yes Max Sane, MD  Melatonin 10 MG TABS Take 10 mg by mouth daily as needed (SLEEP).    Yes [provider]  multivitamin (RENA-VIT) TABS tablet TAKE ONE TABLET BY MOUTH ONCE DAILY 11/09/16  Yes Darylene Price A, FNP  nitroGLYCERIN (NITROSTAT) 0.4 MG SL tablet Place 1 tablet (0.4 mg total) under the tongue every 5 (five)  minutes as needed for chest pain. 01/15/17  Yes Burnard Hawthorne, FNP  pregabalin (LYRICA) 75 MG capsule Take 1 capsule (75 mg total) by mouth daily. 04/21/16  Yes Burnard Hawthorne, FNP    Allergies as of 08/12/2017 - Review Complete 08/11/2017  Allergen Reaction Noted  . Hydrocodone Hives 08/20/2012  . Metformin Diarrhea and Other (See Comments) 10/26/2013  . Lisinopril  03/11/2017    Family History  Problem Relation Age of Onset  . Stroke Mother   . Heart attack Mother   . Heart disease Mother   . Colon cancer Father   . Colon cancer Sister   .  Heart attack Brother   . Stroke Brother   . Diabetes Brother   . Breast cancer Sister   . Diabetes Brother   . Diabetes Daughter   . Anesthesia problems Neg Hx   . Hypotension Neg Hx   . Malignant hyperthermia Neg Hx   . Pseudochol deficiency Neg Hx     Social History   Socioeconomic History  . Marital status: Single    Spouse name: Not on file  . Number of children: 2  . Years of education: Not on file  . Highest education level: Not on file  Social Needs  . Financial resource strain: Not on file  . Food insecurity - worry: Not on file  . Food insecurity - inability: Not on file  . Transportation needs - medical: Not on file  . Transportation needs - non-medical: Not on file  Occupational History  . Not on file  Tobacco Use  . Smoking status: Former Smoker    Packs/day: 1.00    Years: 25.00    Pack years: 25.00    Last attempt to quit: 06/09/2010    Years since quitting: 7.2  . Smokeless tobacco: Never Used  Substance and Sexual Activity  . Alcohol use: No  . Drug use: No  . Sexual activity: No  Other Topics Concern  . Not on file  Social History Narrative   On disability.    Lives alone.    2 children      Son drives her since her vision has decreased    Review of Systems: See HPI, otherwise negative ROS  Physical Exam: BP (!) 218/72   Pulse 83   Temp (!) 97.1 F (36.2 C) (Tympanic)   Resp 20    Ht 5\' 8"  (1.727 m)   Wt 173 lb (78.5 kg)   SpO2 100%   BMI 26.30 kg/m  General:   Alert,  pleasant and cooperative in NAD Head:  Normocephalic and atraumatic. Neck:  Supple; no masses or thyromegaly. Lungs:  Clear throughout to auscultation, normal respiratory effort.    Heart:  +S1, +S2, Regular rate and rhythm, No edema. Abdomen:  Soft, nontender and nondistended. Normal bowel sounds, without guarding, and without rebound.   Neurologic:  Alert and  oriented x4;  grossly normal neurologically.  Impression/Plan: Theresa [name] is here for an colonoscopy to be performed for surveillance due to prior history of colon polyps   Risks, benefits, limitations, and alternatives regarding  colonoscopy have been reviewed with the patient.  Questions have been answered.  All parties agreeable.   [name], MD  08/20/2017, 9:59 AM

## 2017-08-20 NOTE — Anesthesia Post-op Follow-up Note (Signed)
Anesthesia QCDR form completed.        

## 2017-08-20 NOTE — Op Note (Signed)
[facility] Gastroenterology Patient Name: Theresa [name] Procedure Date: 08/20/2017 10:51 AM MRN: [id number] Account #: [id number] Date of Birth: [birth year] Admit Type: Outpatient Age: 62 Room: [facility] ENDO ROOM 4 Gender: Female Note Status: Finalized Procedure:            Colonoscopy Indications:          High risk colon cancer surveillance: Personal history                        of colonic polyps Providers:            [name] MD, MD Referring MD:         Yvetta Coder. Arnett (Referring MD) Medicines:            Monitored Anesthesia Care Complications:        No immediate complications. Procedure:            Pre-Anesthesia Assessment:                       - Prior to the procedure, a History and Physical was                        performed, and patient medications, allergies and                        sensitivities were reviewed. The patient's tolerance of                        previous anesthesia was reviewed.                       - The risks and benefits of the procedure and the                        sedation options and risks were discussed with the                        patient. All questions were answered and informed                        consent was obtained.                       - ASA Grade Assessment: III - A patient with severe                        systemic disease.                       After obtaining informed consent, the colonoscope was                        passed under direct vision. Throughout the procedure,                        the patient's blood pressure, pulse, and oxygen                        saturations were monitored continuously. The                        Colonoscope  was introduced through the anus and                        advanced to the the cecum, identified by the                        appendiceal orifice, IC valve and transillumination.                        The colonoscopy was performed with ease. The patient                  tolerated the procedure well. The quality of the bowel                        preparation was adequate. Findings:      The perianal and digital rectal examinations were normal.      Seven sessile polyps were found in the ascending colon. The polyps were       5 to 7 mm in size. These polyps were removed with a cold snare.       Resection and retrieval were complete.      Two sessile polyps were found in the descending colon and transverse       colon. The polyps were 5 to 7 mm in size. These polyps were removed with       a cold snare. Resection and retrieval were complete.      A 10 mm polyp was found in the descending colon. The polyp was sessile.       The polyp was removed with a hot snare. Resection and retrieval were       complete.      The exam was otherwise without abnormality. Impression:           - Seven 5 to 7 mm polyps in the ascending colon,                        removed with a cold snare. Resected and retrieved.                       - Two 5 to 7 mm polyps in the descending colon and in                        the transverse colon, removed with a cold snare.                        Resected and retrieved.                       - One 10 mm polyp in the descending colon, removed with                        a hot snare. Resected and retrieved.                       - The examination was otherwise normal. Recommendation:       - Discharge patient to home (with escort).                       - Resume previous diet.                       -  Continue present medications.                       - Await pathology results.                       - Repeat colonoscopy in 3 years for surveillance. Procedure Code(s):    --- Professional ---                       [phone], Colonoscopy, flexible; with removal of tumor(s),                        polyp(s), or other lesion(s) by snare technique Diagnosis Code(s):    --- Professional ---                       Z86.010, Personal  history of colonic polyps                       D12.2, Benign neoplasm of ascending colon                       D12.4, Benign neoplasm of descending colon                       D12.3, Benign neoplasm of transverse colon (hepatic                        flexure or splenic flexure) CPT copyright 2016 American Medical Association. All rights reserved. The codes documented in this report are preliminary and upon coder review may  be revised to meet current compliance requirements. [name], MD [name] MD, MD 08/20/2017 11:23:36 AM This report has been signed electronically. Number of Addenda: 0 Note Initiated On: 08/20/2017 10:51 AM Scope Withdrawal Time: 0 hours 16 minutes 52 seconds  Total Procedure Duration: 0 hours 22 minutes 10 seconds       [facility]

## 2017-08-21 ENCOUNTER — Ambulatory Visit: Payer: Medicare Other | Admitting: Family Medicine

## 2017-08-21 ENCOUNTER — Ambulatory Visit: Payer: Self-pay | Admitting: *Deleted

## 2017-08-21 ENCOUNTER — Encounter: Payer: Self-pay | Admitting: Family

## 2017-08-21 ENCOUNTER — Encounter: Payer: Self-pay | Admitting: Gastroenterology

## 2017-08-21 DIAGNOSIS — D509 Iron deficiency anemia, unspecified: Secondary | ICD-10-CM | POA: Diagnosis not present

## 2017-08-21 DIAGNOSIS — Z992 Dependence on renal dialysis: Secondary | ICD-10-CM | POA: Diagnosis not present

## 2017-08-21 DIAGNOSIS — N186 End stage renal disease: Secondary | ICD-10-CM | POA: Diagnosis not present

## 2017-08-21 DIAGNOSIS — D631 Anemia in chronic kidney disease: Secondary | ICD-10-CM | POA: Diagnosis not present

## 2017-08-21 DIAGNOSIS — N2581 Secondary hyperparathyroidism of renal origin: Secondary | ICD-10-CM | POA: Diagnosis not present

## 2017-08-21 LAB — SURGICAL PATHOLOGY

## 2017-08-21 NOTE — Telephone Encounter (Signed)
ESRD patient call  Patient had to hang up prior to scheduling an appointment for today. She has been triaged and needs an appointment when she phones back..  Reason for Disposition . Diabetes mellitus or weak immune system (e.g., HIV positive, cancer chemotherapy, transplant patient)  Answer Assessment - Initial Assessment Questions Patient is a Hemodialysis pt with tx on M,W,F. Today she had a few drops of urine with pain. Denies, fever, abdominal pain, and itching. Continues to feel pain in the region after the voiding this am. She reports she had a colonoscopy yesterday with 32 ounces of prep. She is currently en route to HD.   1. SEVERITY: "How bad is the pain?"  (e.g., Scale 1-10; mild, moderate, or severe)   - MILD (1-3): complains slightly about urination hurting   - MODERATE (4-7): interferes with normal activities     - SEVERE (8-10): excruciating, unwilling or unable to urinate because of the pain      10 2. FREQUENCY: "How many times have you had painful urination today?"   one 3. PATTERN: "Is pain present every time you urinate or just sometimes?"  Has not had any urine for 4 days until drops of urine this morning. On hemodialysis. 4. ONSET: "When did the painful urination start?"     today 5. FEVER: "Do you have a fever?" If so, ask: "What is your temperature, how was it measured, and when did it start?" no 6. PAST UTI: "Have you had a urine infection before?" If so, ask: "When was the last time?" and "What happened that time?"     no 7. CAUSE: "What do you think is causing the painful urination?"  (e.g., UTI, scratch, Herpes sore)    Does not know. Had colonoscopy yesterday and had to drink 32 ounces of prep.  8. OTHER SYMPTOMS: "Do you have any other symptoms?" (e.g., flank pain, vaginal discharge, genital sores, urgency, blood in urine)   no 9. PREGNANCY: "Is there any chance you are pregnant?" "When was your last menstrual period?" no  Protocols used: URINATION PAIN  - [facility]

## 2017-08-23 ENCOUNTER — Encounter: Payer: Self-pay | Admitting: Gastroenterology

## 2017-08-24 ENCOUNTER — Encounter: Payer: Self-pay | Admitting: Family Medicine

## 2017-08-24 ENCOUNTER — Ambulatory Visit (INDEPENDENT_AMBULATORY_CARE_PROVIDER_SITE_OTHER): Payer: Medicare Other | Admitting: Family Medicine

## 2017-08-24 ENCOUNTER — Ambulatory Visit: Payer: Medicare Other | Admitting: Family Medicine

## 2017-08-24 VITALS — BP 146/56 | HR 76 | Temp 98.8°F | Wt 172.2 lb

## 2017-08-24 DIAGNOSIS — I6523 Occlusion and stenosis of bilateral carotid arteries: Secondary | ICD-10-CM

## 2017-08-24 DIAGNOSIS — N186 End stage renal disease: Secondary | ICD-10-CM | POA: Diagnosis not present

## 2017-08-24 DIAGNOSIS — R300 Dysuria: Secondary | ICD-10-CM

## 2017-08-24 DIAGNOSIS — D631 Anemia in chronic kidney disease: Secondary | ICD-10-CM | POA: Diagnosis not present

## 2017-08-24 DIAGNOSIS — Z992 Dependence on renal dialysis: Secondary | ICD-10-CM | POA: Diagnosis not present

## 2017-08-24 DIAGNOSIS — D509 Iron deficiency anemia, unspecified: Secondary | ICD-10-CM | POA: Diagnosis not present

## 2017-08-24 DIAGNOSIS — N2581 Secondary hyperparathyroidism of renal origin: Secondary | ICD-10-CM | POA: Diagnosis not present

## 2017-08-24 MED ORDER — CEPHALEXIN 500 MG PO CAPS: 500.0000 mg | ORAL_CAPSULE | Freq: Every day | ORAL | 0 refills | Status: DC

## 2017-08-24 NOTE — Patient Instructions (Signed)
Try to get [place] a urine sample and start taking keflex in the meantime.  Take care.  Glad to see you.  Update [place] as needed.

## 2017-08-24 NOTE — Progress Notes (Signed)
Renal clinic is addressing her losartan rx with the recent recall.    ESRD on HD, MWF.  She doesn't make urine on days with HD but she does on other days.  She can't make urine at this point.  She has pain with urination.   Started about 3 days ago.  Pain is internal.  No fevers.  She doesn't have pain o/w. No abd pain.  She tried AZO w/o relief.    Meds, vitals, and allergies reviewed.   ROS: Per HPI unless specifically indicated in ROS section   GEN: nad, alert and oriented HEENT: mucous membranes moist NECK: supple w/o LA CV: rrr PULM: ctab, no inc wob ABD: soft, +bs, suprapubic area slightly tender to palpation w/o rebound.  No CVA pain EXT: no edema SKIN: no acute rash Left arm with hemodialysis site with thrill as expected.

## 2017-08-25 DIAGNOSIS — N186 End stage renal disease: Secondary | ICD-10-CM

## 2017-08-25 DIAGNOSIS — Z7682 Awaiting organ transplant status: Principal | ICD-10-CM

## 2017-08-25 DIAGNOSIS — Z01818 Encounter for other preprocedural examination: Secondary | ICD-10-CM

## 2017-08-25 NOTE — Assessment & Plan Note (Signed)
Presumed cystitis.  No sign of pyelonephritis on exam.  She is unable to produce urine at this point.  This is typical for her.  I discussed with pharmacy.  We will start Keflex 500 mg a day for 7 days.  We sent her home with a container to collect a sterile urine sample.  Update [place] as needed.  She agrees with plan.

## 2017-08-26 ENCOUNTER — Other Ambulatory Visit: Payer: Medicare Other

## 2017-08-26 ENCOUNTER — Ambulatory Visit: Payer: Medicare Other | Admitting: Family Medicine

## 2017-08-26 DIAGNOSIS — R300 Dysuria: Secondary | ICD-10-CM

## 2017-08-26 DIAGNOSIS — N2581 Secondary hyperparathyroidism of renal origin: Secondary | ICD-10-CM | POA: Diagnosis not present

## 2017-08-26 DIAGNOSIS — D631 Anemia in chronic kidney disease: Secondary | ICD-10-CM | POA: Diagnosis not present

## 2017-08-26 DIAGNOSIS — N186 End stage renal disease: Secondary | ICD-10-CM | POA: Diagnosis not present

## 2017-08-26 DIAGNOSIS — D509 Iron deficiency anemia, unspecified: Secondary | ICD-10-CM | POA: Diagnosis not present

## 2017-08-26 DIAGNOSIS — Z992 Dependence on renal dialysis: Secondary | ICD-10-CM | POA: Diagnosis not present

## 2017-08-27 ENCOUNTER — Encounter (INDEPENDENT_AMBULATORY_CARE_PROVIDER_SITE_OTHER): Payer: Medicare Other

## 2017-08-27 ENCOUNTER — Ambulatory Visit (INDEPENDENT_AMBULATORY_CARE_PROVIDER_SITE_OTHER): Payer: Medicare Other | Admitting: Vascular Surgery

## 2017-08-28 DIAGNOSIS — D631 Anemia in chronic kidney disease: Secondary | ICD-10-CM | POA: Diagnosis not present

## 2017-08-28 DIAGNOSIS — N2581 Secondary hyperparathyroidism of renal origin: Secondary | ICD-10-CM | POA: Diagnosis not present

## 2017-08-28 DIAGNOSIS — N186 End stage renal disease: Secondary | ICD-10-CM | POA: Diagnosis not present

## 2017-08-28 DIAGNOSIS — Z992 Dependence on renal dialysis: Secondary | ICD-10-CM | POA: Diagnosis not present

## 2017-08-28 DIAGNOSIS — D509 Iron deficiency anemia, unspecified: Secondary | ICD-10-CM | POA: Diagnosis not present

## 2017-08-28 LAB — URINE CULTURE
MICRO NUMBER:: 90351378
SPECIMEN QUALITY:: ADEQUATE

## 2017-08-31 ENCOUNTER — Ambulatory Visit: Payer: Self-pay | Admitting: *Deleted

## 2017-08-31 ENCOUNTER — Telehealth: Payer: Self-pay | Admitting: Family

## 2017-08-31 DIAGNOSIS — N186 End stage renal disease: Secondary | ICD-10-CM | POA: Diagnosis not present

## 2017-08-31 DIAGNOSIS — D509 Iron deficiency anemia, unspecified: Secondary | ICD-10-CM | POA: Diagnosis not present

## 2017-08-31 DIAGNOSIS — N2581 Secondary hyperparathyroidism of renal origin: Secondary | ICD-10-CM | POA: Diagnosis not present

## 2017-08-31 DIAGNOSIS — D631 Anemia in chronic kidney disease: Secondary | ICD-10-CM | POA: Diagnosis not present

## 2017-08-31 DIAGNOSIS — Z992 Dependence on renal dialysis: Secondary | ICD-10-CM | POA: Diagnosis not present

## 2017-08-31 MED ORDER — CEPHALEXIN 500 MG PO CAPS: 500.0000 mg | ORAL_CAPSULE | Freq: Every day | ORAL | 0 refills | Status: DC

## 2017-08-31 NOTE — Telephone Encounter (Signed)
Copied from [place] [phone]. Topic: Inquiry >> Aug 31, 2017  9:01 AM Conception Chancy, NT wrote: Patient is calling and states she was brought a urine sample in on 3/19 and did not get the results. She states she knows she has a UTI and it is not getting better. What is her next step? Please contact pt with results and further advise.

## 2017-08-31 NOTE — Telephone Encounter (Signed)
Pt saw Dr Damita Dunnings on 08/24/17; I spoke with pt and after finishing abx still has pain when urinates; No fever.pt is on dialysis. [facility] [address]

## 2017-08-31 NOTE — Telephone Encounter (Signed)
Would restart the abx and if still with burning with urination at the end of the abx then needs recheck.  Thanks.  rx sent.

## 2017-08-31 NOTE — Addendum Note (Signed)
Addended by: Tonia Ghent on: 08/31/2017 01:42 PM   Modules accepted: Orders

## 2017-08-31 NOTE — Telephone Encounter (Signed)
Pt states she is still having pain when she urinates and back pain; the pt states that she started taking antibiotics since last Monday 08/24/17 and finished today but her symptoms remain; will route information to office for provider review;  The pt ca be contacted at [phone] and she is at HD today at 1500; she also state that a message can be left if she does not answer; will route to office for provider review.  Reason for Disposition . [1] Taking antibiotic > 72 hours (3 days) for UTI AND [2] painful urination or frequency not improved  Answer Assessment - Initial Assessment Questions 1. ANTIBIOTIC: "What antibiotic are you taking?" "How many times per day?"     cephalexin 500 mg daily 2. DURATION: "When was the antibiotic started?"     Monday 08/24/17 3. MAIN SYMPTOM: "What is the main symptom you are concerned about?"     pain 4. FEVER: "Do you have a fever?" If so, ask: "What is it, how was it measured, and when did it start?"  no 5. OTHER SYMPTOMS: "Do you have any other symptoms?" (e.g., flank pain, vaginal discharge, blood in urine)     pain  Protocols used: URINARY TRACT INFECTION ON ANTIBIOTIC FOLLOW-UP CALL [facility]

## 2017-08-31 NOTE — Telephone Encounter (Signed)
Left message on voice mail to call back. (See lab result)

## 2017-08-31 NOTE — Telephone Encounter (Signed)
Left detailed message on voicemail.  

## 2017-09-02 DIAGNOSIS — Z992 Dependence on renal dialysis: Secondary | ICD-10-CM | POA: Diagnosis not present

## 2017-09-02 DIAGNOSIS — D509 Iron deficiency anemia, unspecified: Secondary | ICD-10-CM | POA: Diagnosis not present

## 2017-09-02 DIAGNOSIS — D631 Anemia in chronic kidney disease: Secondary | ICD-10-CM | POA: Diagnosis not present

## 2017-09-02 DIAGNOSIS — N2581 Secondary hyperparathyroidism of renal origin: Secondary | ICD-10-CM | POA: Diagnosis not present

## 2017-09-02 DIAGNOSIS — N186 End stage renal disease: Secondary | ICD-10-CM | POA: Diagnosis not present

## 2017-09-03 ENCOUNTER — Encounter: Admit: 2017-09-03 | Discharge: 2017-09-03 | Payer: MEDICARE

## 2017-09-03 ENCOUNTER — Ambulatory Visit
Admit: 2017-09-03 | Discharge: 2017-09-03 | Payer: MEDICARE | Attending: Student in an Organized Health Care Education/Training Program | Primary: Student in an Organized Health Care Education/Training Program

## 2017-09-03 ENCOUNTER — Ambulatory Visit: Payer: Medicare Other | Admitting: Family

## 2017-09-03 DIAGNOSIS — Z01818 Encounter for other preprocedural examination: Principal | ICD-10-CM

## 2017-09-03 DIAGNOSIS — Z992 Dependence on renal dialysis: Secondary | ICD-10-CM

## 2017-09-03 DIAGNOSIS — N186 End stage renal disease: Principal | ICD-10-CM

## 2017-09-04 DIAGNOSIS — D631 Anemia in chronic kidney disease: Secondary | ICD-10-CM | POA: Diagnosis not present

## 2017-09-04 DIAGNOSIS — N186 End stage renal disease: Secondary | ICD-10-CM | POA: Diagnosis not present

## 2017-09-04 DIAGNOSIS — N2581 Secondary hyperparathyroidism of renal origin: Secondary | ICD-10-CM | POA: Diagnosis not present

## 2017-09-04 DIAGNOSIS — Z992 Dependence on renal dialysis: Secondary | ICD-10-CM | POA: Diagnosis not present

## 2017-09-04 DIAGNOSIS — D509 Iron deficiency anemia, unspecified: Secondary | ICD-10-CM | POA: Diagnosis not present

## 2017-09-06 DIAGNOSIS — Z992 Dependence on renal dialysis: Secondary | ICD-10-CM | POA: Diagnosis not present

## 2017-09-06 DIAGNOSIS — D631 Anemia in chronic kidney disease: Secondary | ICD-10-CM | POA: Diagnosis not present

## 2017-09-06 DIAGNOSIS — D509 Iron deficiency anemia, unspecified: Secondary | ICD-10-CM | POA: Diagnosis not present

## 2017-09-06 DIAGNOSIS — N186 End stage renal disease: Secondary | ICD-10-CM | POA: Diagnosis not present

## 2017-09-06 DIAGNOSIS — N2581 Secondary hyperparathyroidism of renal origin: Secondary | ICD-10-CM | POA: Diagnosis not present

## 2017-09-07 ENCOUNTER — Encounter: Admit: 2017-09-07 | Discharge: 2017-09-07 | Payer: MEDICARE | Attending: Nephrology | Primary: Nephrology

## 2017-09-07 DIAGNOSIS — Z992 Dependence on renal dialysis: Secondary | ICD-10-CM | POA: Diagnosis not present

## 2017-09-07 DIAGNOSIS — N2581 Secondary hyperparathyroidism of renal origin: Secondary | ICD-10-CM | POA: Diagnosis not present

## 2017-09-07 DIAGNOSIS — D631 Anemia in chronic kidney disease: Secondary | ICD-10-CM | POA: Diagnosis not present

## 2017-09-07 DIAGNOSIS — N186 End stage renal disease: Secondary | ICD-10-CM | POA: Diagnosis not present

## 2017-09-07 DIAGNOSIS — D509 Iron deficiency anemia, unspecified: Secondary | ICD-10-CM | POA: Diagnosis not present

## 2017-09-09 ENCOUNTER — Encounter (INDEPENDENT_AMBULATORY_CARE_PROVIDER_SITE_OTHER): Payer: Medicare Other

## 2017-09-09 ENCOUNTER — Ambulatory Visit (INDEPENDENT_AMBULATORY_CARE_PROVIDER_SITE_OTHER): Payer: Medicare Other | Admitting: Vascular Surgery

## 2017-09-09 DIAGNOSIS — D631 Anemia in chronic kidney disease: Secondary | ICD-10-CM | POA: Diagnosis not present

## 2017-09-09 DIAGNOSIS — N186 End stage renal disease: Secondary | ICD-10-CM | POA: Diagnosis not present

## 2017-09-09 DIAGNOSIS — D509 Iron deficiency anemia, unspecified: Secondary | ICD-10-CM | POA: Diagnosis not present

## 2017-09-09 DIAGNOSIS — Z992 Dependence on renal dialysis: Secondary | ICD-10-CM | POA: Diagnosis not present

## 2017-09-09 DIAGNOSIS — N2581 Secondary hyperparathyroidism of renal origin: Secondary | ICD-10-CM | POA: Diagnosis not present

## 2017-09-09 NOTE — Progress Notes (Signed)
Patient ID: Theresa [name], female    DOB: Jan 14, 1956, 62 y.o.   MRN: [id number]  HPI Ms Theresa [name] is a 62 y/o female with a history of PVD, HTN, hyperlipidemia, COPD, CAD with NSTEMI, anemia, previous tobacco use, DM, ESRD on dialysis and chronic heart failure.  Last echo was done 02/26/16 which showed an EF of 55-60%, mild AS and moderate/severe MR which is worse than previous echo done on 11/24/14. Right-sided pressure elevated at 50 mm Hg. Cardiac catheterization done November 2016.   Admitted 07/26/17 due to HF exacerbation. Medications adjusted and she was discharged after 2 days. Was in the ED 07/15/17 due to influenza where she was treated and released.   Patient presents today for a follow-up visit with a chief complaint of an intermittent cough. She says this has been present for several months. She has associated head congestion and chronic back pain along with this. She denies any difficulty sleeping, abdominal distention, palpitations, edema, chest pain, shortness of breath, dizziness or fatigue.   Past Medical History:  Diagnosis Date  . Anemia of chronic disease   . Carotid arterial disease ([place])    a. 02/2013 U/S: 40-59% bilat ICA stenosis - *f/u 02/2014*  . Chronic constipation   . Chronic diastolic CHF (congestive heart failure) ([place])    a. 10/2013 Echo [facility]): EF 55-60%, mod conc LVH, mod MR, mildly dil LA, mild Ao sclerosis w/o stenosis.  . Colon polyps   . COPD (chronic obstructive pulmonary disease) ([place])   . Coronary artery disease    a. 05/2013 NSTEMI/PCI: LM 20d, LAD min irregs, LCX small, nl, OM1 nl, RCA dom 56m (2.5x16 Promus DES), PDA1 80p.  . Diabetes mellitus   . Diabetic neuropathy ([place])   . Diabetic retinopathy ([place]) 05/28/2013   Hx bilat retinal detachment, proliferative diab retinopathy and bilat vitreous hemorrhage   . Emphysema   . ESRD on hemodialysis ([place])    a. [facility] in [place], [place] [phone]/Dr. [name], on a MWF schedule.  She started dialysis in Feb  2014.  Etiology of renal failure not known, likely diabetes.  Has a left upper arm AV graft.  . History of bronchitis    Mar 2012  . History of pneumonia    June 2012  . History of tobacco abuse    a. Quit 2012.  Marland Kitchen Hyperlipidemia   . Hypertension   . Moderate mitral insufficiency    a. 10/2013 Echo: EF 55-60%, mod MR.  . Myocardial infarct ([place]) 05/2013  . Peripheral vascular disease ([place])   . Renal insufficiency   . Sickle cell trait ([place])   . Thyroid nodule    [place] 06/2017 due to f/u US in 1 year    Past Surgical History:  Procedure Laterality Date  . ABDOMINAL HYSTERECTOMY     2000  . CARDIAC CATHETERIZATION    . CARDIAC CATHETERIZATION N/A 05/02/2015   Procedure: Left Heart Cath and Coronary Angiography;  Surgeon: [name], MD;  Location: [place] CV LAB;  Service: Cardiovascular;  Laterality: N/A;  . COLONOSCOPY WITH PROPOFOL N/A 07/08/2016   Procedure: COLONOSCOPY WITH PROPOFOL;  Surgeon: [name], MD;  Location: [facility] ENDOSCOPY;  Service: Endoscopy;  Laterality: N/A;  . COLONOSCOPY WITH PROPOFOL N/A 08/20/2017   Procedure: COLONOSCOPY WITH PROPOFOL;  Surgeon: [name], MD;  Location: [facility] ENDOSCOPY;  Service: Gastroenterology;  Laterality: N/A;  . colonscopy    . CORONARY ANGIOPLASTY  05/28/2014   stent placement to the mid RCA  . DILATION AND  CURETTAGE OF UTERUS     several in the early 80's  . ESOPHAGOGASTRODUODENOSCOPY     2012  . EYE SURGERY     bilateral laser 2012  . EYE SURGERY     right  . EYE SURGERY     x4 both eyes  . GAS INSERTION  09/30/2011   Procedure: INSERTION OF GAS;  Surgeon: [name], MD;  Location: [place];  Service: Ophthalmology;  Laterality: Right;  C3F8  . GAS/FLUID EXCHANGE  09/30/2011   Procedure: GAS/FLUID EXCHANGE;  Surgeon: [name], MD;  Location: [place];  Service: Ophthalmology;  Laterality: Right;  . LEFT HEART CATHETERIZATION WITH CORONARY ANGIOGRAM N/A 05/28/2013   Procedure: LEFT HEART CATHETERIZATION WITH  CORONARY ANGIOGRAM;  Surgeon: [name], MD;  Location: [facility] CATH LAB;  Service: Cardiovascular;  Laterality: N/A;  . PARS PLANA VITRECTOMY  04/22/2011   Procedure: PARS PLANA VITRECTOMY WITH 25 GAUGE;  Surgeon: [name], MD;  Location: [place];  Service: Ophthalmology;  Laterality: Left;  membrane peel, endolaser, gas fluid exchange, silicone oil, repair of complex traction retinal detachment  . PARS PLANA VITRECTOMY  09/30/2011   Procedure: PARS PLANA VITRECTOMY WITH 25 GAUGE;  Surgeon: [name], MD;  Location: [place];  Service: Ophthalmology;  Laterality: Right;  Endolaser; Repair of Complex Traction Retinal Detachment  . PARS PLANA VITRECTOMY  02/24/2012   Procedure: PARS PLANA VITRECTOMY WITH 25 GAUGE;  Surgeon: [name], MD;  Location: [place];  Service: Ophthalmology;  Laterality: Left;  . PTCA    . SILICON OIL REMOVAL  9/62/2297   Procedure: SILICON OIL REMOVAL;  Surgeon: [name], MD;  Location: [place];  Service: Ophthalmology;  Laterality: Left;  . THROMBECTOMY / ARTERIOVENOUS GRAFT REVISION    . TUBAL LIGATION     1979   Family History  Problem Relation Age of Onset  . Stroke Mother   . Heart attack Mother   . Heart disease Mother   . Colon cancer Father   . Colon cancer Sister   . Heart attack Brother   . Stroke Brother   . Diabetes Brother   . Breast cancer Sister   . Diabetes Brother   . Diabetes Daughter   . Anesthesia problems Neg Hx   . Hypotension Neg Hx   . Malignant hyperthermia Neg Hx   . Pseudochol deficiency Neg Hx    Social History   Tobacco Use  . Smoking status: Former Smoker    Packs/day: 1.00    Years: 25.00    Pack years: 25.00    Last attempt to quit: 06/09/2010    Years since quitting: 7.2  . Smokeless tobacco: Never Used  Substance Use Topics  . Alcohol use: No   Allergies  Allergen Reactions  . Hydrocodone Hives  . Metformin Diarrhea and Other (See Comments)    Other reaction(s): Distress (finding)  .  Lisinopril     unknown   Prior to Admission medications   Medication Sig Start Date End Date Taking? Authorizing Provider  albuterol (PROVENTIL) (2.5 MG/3ML) 0.083% nebulizer solution Take 2.5 mg by nebulization every 4 (four) hours as needed for wheezing or shortness of breath.   Yes [provider]  amLODipine (NORVASC) 10 MG tablet Take 10 mg by mouth daily.  09/17/16  Yes [provider]  atorvastatin (LIPITOR) 40 MG tablet Take 1 tablet (40 mg total) by mouth daily. 07/03/16 03/11/18 Yes Alisa Graff, FNP  carvedilol (COREG)  12.5 MG tablet IF BLOOD PRESSURE GREATER THAN 160, TAKE 2 TABLETS BY MOUTH TWICE DAILY, OTHERWISE TAKE 1 TABLET BY MOUTH TWICE DAILY. 03/02/17  Yes Darylene Price A, FNP  cinacalcet (SENSIPAR) 30 MG tablet Take 30 mg by mouth daily.   Yes [provider]  cloNIDine (CATAPRES) 0.1 MG tablet Take 0.1 mg by mouth 2 (two) times daily.   Yes [provider]  furosemide (LASIX) 80 MG tablet Take 80 mg by mouth daily.    Yes [provider]  isosorbide mononitrate (IMDUR) 60 MG 24 hr tablet Take 1 tablet (60 mg total) by mouth daily. 07/28/17  Yes Max Sane, MD  losartan (COZAAR) 100 MG tablet Take 1 tablet (100 mg total) by mouth daily. Patient taking differently: Take 50 mg by mouth daily.  07/28/17  Yes Max Sane, MD  multivitamin (RENA-VIT) TABS tablet TAKE ONE TABLET BY MOUTH ONCE DAILY 11/09/16  Yes Darylene Price A, FNP  nitroGLYCERIN (NITROSTAT) 0.4 MG SL tablet Place 1 tablet (0.4 mg total) under the tongue every 5 (five) minutes as needed for chest pain. 01/15/17  Yes Burnard Hawthorne, FNP  ondansetron (ZOFRAN-ODT) 8 MG disintegrating tablet Take 8 mg by mouth every 8 (eight) hours as needed for nausea or vomiting.   Yes [provider]  pregabalin (LYRICA) 75 MG capsule Take 1 capsule (75 mg total) by mouth daily. 04/21/16  Yes Arnett, Yvetta Coder, FNP  albuterol (PROVENTIL HFA;VENTOLIN HFA) 108 (90 Base) MCG/ACT  inhaler Inhale 2 puffs into the lungs every 6 (six) hours as needed for wheezing or shortness of breath. 09/10/17   Alisa Graff, FNP  aspirin EC 81 MG tablet Take 81 mg by mouth daily.    [provider]  Melatonin 10 MG TABS Take 10 mg by mouth daily as needed (SLEEP).     [provider]    Review of Systems  Constitutional: Negative for appetite change and fatigue.  HENT: Positive for congestion and postnasal drip. Negative for sore throat.   Eyes: Negative.   Respiratory: Positive for cough. Negative for chest tightness, shortness of breath and wheezing.   Cardiovascular: Negative for chest pain, palpitations and leg swelling.  Gastrointestinal: Negative for abdominal distention and abdominal pain.  Endocrine: Negative.   Genitourinary: Negative.   Musculoskeletal: Positive for back pain. Negative for neck pain.  Skin: Negative.   Allergic/Immunologic: Negative.   Neurological: Negative for dizziness, weakness, light-headedness and headaches.  Hematological: Negative for adenopathy. Does not bruise/bleed easily.  Psychiatric/Behavioral: Negative for dysphoric mood and sleep disturbance. The patient is not nervous/anxious.        Using melatonin at bedtime   Vitals:   09/10/17 1136  BP: (!) 155/60  Pulse: 76  Resp: 18  SpO2: 99%  Weight: 172 lb 6 oz (78.2 kg)  Height: 5\' 7"  (1.702 m)   Wt Readings from Last 3 Encounters:  09/10/17 172 lb 6 oz (78.2 kg)  09/10/17 171 lb 6.4 oz (77.7 kg)  08/24/17 172 lb 4 oz (78.1 kg)   Lab Results  Component Value Date   CREATININE 5.83 (H) 07/28/2017   CREATININE 10.74 (H) 07/27/2017   CREATININE 10.08 (H) 07/26/2017     Physical Exam  Constitutional: She is oriented to person, place, and time. She appears well-developed and well-nourished.  HENT:  Head: Normocephalic and atraumatic.  Neck: Normal range of motion. Neck supple. No JVD present.  Cardiovascular: Normal rate and regular rhythm.  Pulmonary/Chest:  Effort normal. She has no  wheezes. She has no rales.  Abdominal: Soft. She exhibits no distension. There is no tenderness.  Musculoskeletal: She exhibits no edema or tenderness.  Neurological: She is alert and oriented to person, place, and time.  Skin: Skin is warm and dry.  Psychiatric: She has a normal mood and affect. Her behavior is normal. Thought content normal.  Nursing note and vitals reviewed.    Assessment & Plan:  1: Chronic heart failure with preserved ejection fraction- - NYHA class I  - euvolemic  - continue daily weights and call for an overnight weight gain of >2 pounds/weekly weight gain of >5 pounds.  - weight unchanged from 08/24/17 - tries not to salt foods; has been using salt substitute, when eats canned vegetables will thoroughly rinse them; reminded to closely follow a 2000mg  sodium diet - Last saw cardiology Rockey Situ) 02/17/17.  - BNP 07/26/17 was 1436.0  2: HTN- - BP looks pretty good today - saw PCP Damita Dunnings) 08/24/17 - BMP 07/28/17 reviewed and showed sodium 137, potassium 3.7 and GFR 8  3: COPD- - oxygen at 2L as needed  - refilled her albuterol inhaler today  4: ESRD on hemodialysis- - dialysis on M, W, F - denies difficulty with dialysis - has been on transplant list for ~5 years now  Medication bottles were reviewed.   Did not make a follow-up appointment for patient at this time. Advised patient that she could call back at any time in the future to make another appointment.

## 2017-09-10 ENCOUNTER — Ambulatory Visit (INDEPENDENT_AMBULATORY_CARE_PROVIDER_SITE_OTHER): Payer: Medicare Other | Admitting: Vascular Surgery

## 2017-09-10 ENCOUNTER — Ambulatory Visit (INDEPENDENT_AMBULATORY_CARE_PROVIDER_SITE_OTHER): Payer: Medicare Other

## 2017-09-10 ENCOUNTER — Ambulatory Visit: Payer: Medicare Other | Attending: Family | Admitting: Family

## 2017-09-10 ENCOUNTER — Encounter: Payer: Self-pay | Admitting: Family

## 2017-09-10 ENCOUNTER — Encounter (INDEPENDENT_AMBULATORY_CARE_PROVIDER_SITE_OTHER): Payer: Self-pay | Admitting: Vascular Surgery

## 2017-09-10 VITALS — BP 133/67 | HR 69 | Resp 16 | Ht 67.5 in | Wt 171.4 lb

## 2017-09-10 VITALS — BP 155/60 | HR 76 | Resp 18 | Ht 67.0 in | Wt 172.4 lb

## 2017-09-10 DIAGNOSIS — N186 End stage renal disease: Secondary | ICD-10-CM

## 2017-09-10 DIAGNOSIS — Z01818 Encounter for other preprocedural examination: Secondary | ICD-10-CM

## 2017-09-10 DIAGNOSIS — Z7682 Awaiting organ transplant status: Principal | ICD-10-CM

## 2017-09-10 DIAGNOSIS — I252 Old myocardial infarction: Secondary | ICD-10-CM | POA: Insufficient documentation

## 2017-09-10 DIAGNOSIS — Z992 Dependence on renal dialysis: Secondary | ICD-10-CM | POA: Insufficient documentation

## 2017-09-10 DIAGNOSIS — D573 Sickle-cell trait: Secondary | ICD-10-CM | POA: Diagnosis not present

## 2017-09-10 DIAGNOSIS — I10 Essential (primary) hypertension: Secondary | ICD-10-CM

## 2017-09-10 DIAGNOSIS — I6523 Occlusion and stenosis of bilateral carotid arteries: Secondary | ICD-10-CM | POA: Diagnosis not present

## 2017-09-10 DIAGNOSIS — J449 Chronic obstructive pulmonary disease, unspecified: Secondary | ICD-10-CM | POA: Diagnosis not present

## 2017-09-10 DIAGNOSIS — E785 Hyperlipidemia, unspecified: Secondary | ICD-10-CM | POA: Diagnosis not present

## 2017-09-10 DIAGNOSIS — N189 Chronic kidney disease, unspecified: Secondary | ICD-10-CM

## 2017-09-10 DIAGNOSIS — E1140 Type 2 diabetes mellitus with diabetic neuropathy, unspecified: Secondary | ICD-10-CM | POA: Diagnosis not present

## 2017-09-10 DIAGNOSIS — Z885 Allergy status to narcotic agent status: Secondary | ICD-10-CM | POA: Insufficient documentation

## 2017-09-10 DIAGNOSIS — E1151 Type 2 diabetes mellitus with diabetic peripheral angiopathy without gangrene: Secondary | ICD-10-CM | POA: Diagnosis not present

## 2017-09-10 DIAGNOSIS — D631 Anemia in chronic kidney disease: Secondary | ICD-10-CM | POA: Diagnosis not present

## 2017-09-10 DIAGNOSIS — Z87891 Personal history of nicotine dependence: Secondary | ICD-10-CM | POA: Insufficient documentation

## 2017-09-10 DIAGNOSIS — E1122 Type 2 diabetes mellitus with diabetic chronic kidney disease: Secondary | ICD-10-CM | POA: Insufficient documentation

## 2017-09-10 DIAGNOSIS — I2510 Atherosclerotic heart disease of native coronary artery without angina pectoris: Secondary | ICD-10-CM | POA: Insufficient documentation

## 2017-09-10 DIAGNOSIS — I5032 Chronic diastolic (congestive) heart failure: Secondary | ICD-10-CM | POA: Insufficient documentation

## 2017-09-10 DIAGNOSIS — I132 Hypertensive heart and chronic kidney disease with heart failure and with stage 5 chronic kidney disease, or end stage renal disease: Secondary | ICD-10-CM | POA: Insufficient documentation

## 2017-09-10 DIAGNOSIS — R05 Cough: Secondary | ICD-10-CM | POA: Diagnosis present

## 2017-09-10 DIAGNOSIS — T829XXS Unspecified complication of cardiac and vascular prosthetic device, implant and graft, sequela: Secondary | ICD-10-CM | POA: Diagnosis not present

## 2017-09-10 DIAGNOSIS — Z79899 Other long term (current) drug therapy: Secondary | ICD-10-CM | POA: Insufficient documentation

## 2017-09-10 DIAGNOSIS — Z7982 Long term (current) use of aspirin: Secondary | ICD-10-CM | POA: Diagnosis not present

## 2017-09-10 DIAGNOSIS — E11319 Type 2 diabetes mellitus with unspecified diabetic retinopathy without macular edema: Secondary | ICD-10-CM | POA: Insufficient documentation

## 2017-09-10 DIAGNOSIS — J432 Centrilobular emphysema: Secondary | ICD-10-CM

## 2017-09-10 DIAGNOSIS — Z888 Allergy status to other drugs, medicaments and biological substances status: Secondary | ICD-10-CM | POA: Insufficient documentation

## 2017-09-10 MED ORDER — ALBUTEROL SULFATE HFA 108 (90 BASE) MCG/ACT IN AERS: 2.0000 | INHALATION_SPRAY | Freq: Four times a day (QID) | RESPIRATORY_TRACT | 5 refills | Status: DC | PRN

## 2017-09-10 NOTE — Patient Instructions (Signed)
Continue weighing daily and call for an overnight weight gain of > 2 pounds or a weekly weight gain of >5 pounds. 

## 2017-09-10 NOTE — Progress Notes (Signed)
Subjective:    Patient ID: Theresa [name], female    DOB: 07-17-1955, 62 y.o.   MRN: [id number] Chief Complaint  Patient presents with  . Follow-up    29month HDA   The patient presents for a 16-month HDA follow-up. The patient underwent a duplex ultrasound of the AV access which was notable for a patent graft without any significant hemodynamic stenosis.  Total hemodialysis doppler flow is 1601. The patient denies any issues with hemodialysis such as cannulation problems, increased bleeding, decrease in doppler flow or recirculation. The patient also denies any fistula skin breakdown, pain, edema, pallor or ulceration of the arm / hand.  The patient denies any uremic symptoms.  The patient denies any fever, nausea vomiting.  Review of Systems  Constitutional: Negative.   HENT: Negative.   Eyes: Negative.   Respiratory: Negative.   Cardiovascular: Negative.   Gastrointestinal: Negative.   Endocrine: Negative.   Genitourinary:       ESRD  Musculoskeletal: Negative.   Skin: Negative.   Allergic/Immunologic: Negative.   Neurological: Negative.   Hematological: Negative.   Psychiatric/Behavioral: Negative.       Objective:   Physical Exam  Constitutional: She is oriented to person, place, and time. She appears well-developed and well-nourished. No distress.  HENT:  Head: Normocephalic and atraumatic.  Eyes: Pupils are equal, round, and reactive to light. Conjunctivae are normal.  Neck: Normal range of motion.  Cardiovascular: Normal rate, regular rhythm, normal heart sounds and intact distal pulses.  Pulses:      Radial pulses are 2+ on the right side, and 2+ on the left side.  Left upper extremity forearm graft: Skin is intact.  Good bruit and thrill noted.  Pulmonary/Chest: Effort normal and breath sounds normal.  Musculoskeletal: Normal range of motion. She exhibits no edema.  Neurological: She is alert and oriented to person, place, and time.  Skin: Skin is warm and dry. She  is not diaphoretic.  Psychiatric: She has a normal mood and affect. Her behavior is normal. Judgment and thought content normal.  Vitals reviewed.  BP 133/67 (BP Location: Right Arm)   Pulse 69   Resp 16   Ht 5' 7.5" (1.715 m)   Wt 171 lb 6.4 oz (77.7 kg)   BMI 26.45 kg/m   Past Medical History:  Diagnosis Date  . Anemia of chronic disease   . Carotid arterial disease ([place])    a. 02/2013 U/S: 40-59% bilat ICA stenosis - *f/u 02/2014*  . Chronic constipation   . Chronic diastolic CHF (congestive heart failure) ([place])    a. 10/2013 Echo [facility]): EF 55-60%, mod conc LVH, mod MR, mildly dil LA, mild Ao sclerosis w/o stenosis.  . Colon polyps   . COPD (chronic obstructive pulmonary disease) ([place])   . Coronary artery disease    a. 05/2013 NSTEMI/PCI: LM 20d, LAD min irregs, LCX small, nl, OM1 nl, RCA dom 88m (2.5x16 Promus DES), PDA1 80p.  . Diabetes mellitus   . Diabetic neuropathy ([place])   . Diabetic retinopathy ([place]) 05/28/2013   Hx bilat retinal detachment, proliferative diab retinopathy and bilat vitreous hemorrhage   . Emphysema   . ESRD on hemodialysis ([place])    a. [facility] in [place], [place] [phone]/Dr. [name], on a MWF schedule.  She started dialysis in Feb 2014.  Etiology of renal failure not known, likely diabetes.  Has a left upper arm AV graft.  . History of bronchitis    Mar 2012  . History of  pneumonia    June 2012  . History of tobacco abuse    a. Quit 2012.  Marland Kitchen Hyperlipidemia   . Hypertension   . Moderate mitral insufficiency    a. 10/2013 Echo: EF 55-60%, mod MR.  . Myocardial infarct ([place]) 05/2013  . Peripheral vascular disease ([place])   . Renal insufficiency   . Sickle cell trait ([place])   . Thyroid nodule    [place] 06/2017 due to f/u US in 1 year    Social History   Socioeconomic History  . Marital status: Single    Spouse name: Not on file  . Number of children: 2  . Years of education: Not on file  . Highest education level: Not on file  Occupational History   . Not on file  Social Needs  . Financial resource strain: Not on file  . Food insecurity:    Worry: Not on file    Inability: Not on file  . Transportation needs:    Medical: Not on file    Non-medical: Not on file  Tobacco Use  . Smoking status: Former Smoker    Packs/day: 1.00    Years: 25.00    Pack years: 25.00    Last attempt to quit: 06/09/2010    Years since quitting: 7.2  . Smokeless tobacco: Never Used  Substance and Sexual Activity  . Alcohol use: No  . Drug use: No  . Sexual activity: Never  Lifestyle  . Physical activity:    Days per week: Not on file    Minutes per session: Not on file  . Stress: Not on file  Relationships  . Social connections:    Talks on phone: Not on file    Gets together: Not on file    Attends religious service: Not on file    Active member of club or organization: Not on file    Attends meetings of clubs or organizations: Not on file    Relationship status: Not on file  . Intimate partner violence:    Fear of current or ex partner: Not on file    Emotionally abused: Not on file    Physically abused: Not on file    Forced sexual activity: Not on file  Other Topics Concern  . Not on file  Social History Narrative   On disability.    Lives alone.    2 children      Son drives her since her vision has decreased   Past Surgical History:  Procedure Laterality Date  . ABDOMINAL HYSTERECTOMY     2000  . CARDIAC CATHETERIZATION    . CARDIAC CATHETERIZATION N/A 05/02/2015   Procedure: Left Heart Cath and Coronary Angiography;  Surgeon: [name], MD;  Location: [place] CV LAB;  Service: Cardiovascular;  Laterality: N/A;  . COLONOSCOPY WITH PROPOFOL N/A 07/08/2016   Procedure: COLONOSCOPY WITH PROPOFOL;  Surgeon: [name], MD;  Location: [facility] ENDOSCOPY;  Service: Endoscopy;  Laterality: N/A;  . COLONOSCOPY WITH PROPOFOL N/A 08/20/2017   Procedure: COLONOSCOPY WITH PROPOFOL;  Surgeon: [name], MD;  Location: [facility]  ENDOSCOPY;  Service: Gastroenterology;  Laterality: N/A;  . colonscopy    . CORONARY ANGIOPLASTY  05/28/2014   stent placement to the mid RCA  . DILATION AND CURETTAGE OF UTERUS     several in the early 80's  . ESOPHAGOGASTRODUODENOSCOPY     2012  . EYE SURGERY     bilateral laser 2012  . EYE SURGERY  right  . EYE SURGERY     x4 both eyes  . GAS INSERTION  09/30/2011   Procedure: INSERTION OF GAS;  Surgeon: [name], MD;  Location: [place];  Service: Ophthalmology;  Laterality: Right;  C3F8  . GAS/FLUID EXCHANGE  09/30/2011   Procedure: GAS/FLUID EXCHANGE;  Surgeon: [name], MD;  Location: [place];  Service: Ophthalmology;  Laterality: Right;  . LEFT HEART CATHETERIZATION WITH CORONARY ANGIOGRAM N/A 05/28/2013   Procedure: LEFT HEART CATHETERIZATION WITH CORONARY ANGIOGRAM;  Surgeon: [name], MD;  Location: [facility] CATH LAB;  Service: Cardiovascular;  Laterality: N/A;  . PARS PLANA VITRECTOMY  04/22/2011   Procedure: PARS PLANA VITRECTOMY WITH 25 GAUGE;  Surgeon: [name], MD;  Location: [place];  Service: Ophthalmology;  Laterality: Left;  membrane peel, endolaser, gas fluid exchange, silicone oil, repair of complex traction retinal detachment  . PARS PLANA VITRECTOMY  09/30/2011   Procedure: PARS PLANA VITRECTOMY WITH 25 GAUGE;  Surgeon: [name], MD;  Location: [place];  Service: Ophthalmology;  Laterality: Right;  Endolaser; Repair of Complex Traction Retinal Detachment  . PARS PLANA VITRECTOMY  02/24/2012   Procedure: PARS PLANA VITRECTOMY WITH 25 GAUGE;  Surgeon: [name], MD;  Location: [place];  Service: Ophthalmology;  Laterality: Left;  . PTCA    . SILICON OIL REMOVAL  2/54/2706   Procedure: SILICON OIL REMOVAL;  Surgeon: [name], MD;  Location: [place];  Service: Ophthalmology;  Laterality: Left;  . THROMBECTOMY / ARTERIOVENOUS GRAFT REVISION    . TUBAL LIGATION     1979   Family History  Problem Relation Age of Onset  . Stroke  Mother   . Heart attack Mother   . Heart disease Mother   . Colon cancer Father   . Colon cancer Sister   . Heart attack Brother   . Stroke Brother   . Diabetes Brother   . Breast cancer Sister   . Diabetes Brother   . Diabetes Daughter   . Anesthesia problems Neg Hx   . Hypotension Neg Hx   . Malignant hyperthermia Neg Hx   . Pseudochol deficiency Neg Hx    Allergies  Allergen Reactions  . Hydrocodone Hives  . Metformin Diarrhea and Other (See Comments)    Other reaction(s): Distress (finding)  . Lisinopril     unknown      Assessment & Plan:  The patient presents for a 58-month HDA follow-up. The patient underwent a duplex ultrasound of the AV access which was notable for a patent graft without any significant hemodynamic stenosis.  Total hemodialysis doppler flow is 1601. The patient denies any issues with hemodialysis such as cannulation problems, increased bleeding, decrease in doppler flow or recirculation. The patient also denies any fistula skin breakdown, pain, edema, pallor or ulceration of the arm / hand.  The patient denies any uremic symptoms.  The patient denies any fever, nausea vomiting.  1. ESRD on hemodialysis ([place]) - Stable Studies reviewed with patient. The patient is doing well and currently has adequate dialysis access. Duplex ultrasound of the AV access shows a patent access with no evidence of hemodynamically significant strictures or stenosis.  The patient should continue to have duplex ultrasounds of the dialysis access every six months. The patient was instructed to call the office in the interim if any issues with dialysis access / doppler flow, pain, edema, pallor, fistula skin breakdown or ulceration of the arm / hand occur. The patient  expressed their understanding.  - VAS US DUPLEX DIALYSIS ACCESS (AVF,AVG); Future  2. Anemia in chronic kidney disease, unspecified CKD stage - Stable The patient presents asymptomatically today This is followed by  the patient's nephrologist and primary care physician  3. Complication of vascular access for dialysis, sequela - Stable As above  Current Outpatient Medications on File Prior to Visit  Medication Sig Dispense Refill  . albuterol (PROVENTIL HFA;VENTOLIN HFA) 108 (90 BASE) MCG/ACT inhaler Inhale 2 puffs into the lungs every 6 (six) hours as needed for wheezing or shortness of breath.    Marland Kitchen albuterol (PROVENTIL) (2.5 MG/3ML) 0.083% nebulizer solution Take 2.5 mg by nebulization every 4 (four) hours as needed for wheezing or shortness of breath.    Marland Kitchen amLODipine (NORVASC) 10 MG tablet Take 10 mg by mouth daily.     Marland Kitchen aspirin EC 81 MG tablet Take 81 mg by mouth daily.    Marland Kitchen atorvastatin (LIPITOR) 40 MG tablet Take 1 tablet (40 mg total) by mouth daily. 90 tablet 3  . carvedilol (COREG) 12.5 MG tablet IF BLOOD PRESSURE GREATER THAN 160, TAKE 2 TABLETS BY MOUTH TWICE DAILY, OTHERWISE TAKE 1 TABLET BY MOUTH TWICE DAILY. 70 tablet 6  . cephALEXin (KEFLEX) 500 MG capsule Take 1 capsule (500 mg total) by mouth daily. 7 capsule 0  . cloNIDine (CATAPRES) 0.1 MG tablet Take 0.1 mg by mouth 2 (two) times daily.    . furosemide (LASIX) 80 MG tablet Take 80 mg by mouth daily.     . isosorbide mononitrate (IMDUR) 60 MG 24 hr tablet Take 1 tablet (60 mg total) by mouth daily. 30 tablet 0  . losartan (COZAAR) 100 MG tablet Take 1 tablet (100 mg total) by mouth daily. 30 tablet 0  . Melatonin 10 MG TABS Take 10 mg by mouth daily as needed (SLEEP).     . multivitamin (RENA-VIT) TABS tablet TAKE ONE TABLET BY MOUTH ONCE DAILY 30 tablet 5  . nitroGLYCERIN (NITROSTAT) 0.4 MG SL tablet Place 1 tablet (0.4 mg total) under the tongue every 5 (five) minutes as needed for chest pain. 25 tablet 3  . pregabalin (LYRICA) 75 MG capsule Take 1 capsule (75 mg total) by mouth daily. 90 capsule 1   Current Facility-Administered Medications on File Prior to Visit  Medication Dose Route Frequency Provider Last Rate Last Dose  .  betamethasone acetate-betamethasone sodium phosphate (CELESTONE) injection 3 mg  3 mg Intramuscular Once Edrick Kins, DPM       There are no Patient Instructions on file for this visit. No follow-ups on file.  Faria Casella A Weston Kallman, PA-C

## 2017-09-11 DIAGNOSIS — N2581 Secondary hyperparathyroidism of renal origin: Secondary | ICD-10-CM | POA: Diagnosis not present

## 2017-09-11 DIAGNOSIS — N186 End stage renal disease: Secondary | ICD-10-CM | POA: Diagnosis not present

## 2017-09-11 DIAGNOSIS — D509 Iron deficiency anemia, unspecified: Secondary | ICD-10-CM | POA: Diagnosis not present

## 2017-09-11 DIAGNOSIS — D631 Anemia in chronic kidney disease: Secondary | ICD-10-CM | POA: Diagnosis not present

## 2017-09-11 DIAGNOSIS — Z992 Dependence on renal dialysis: Secondary | ICD-10-CM | POA: Diagnosis not present

## 2017-09-14 DIAGNOSIS — E119 Type 2 diabetes mellitus without complications: Secondary | ICD-10-CM | POA: Diagnosis not present

## 2017-09-14 DIAGNOSIS — N2581 Secondary hyperparathyroidism of renal origin: Secondary | ICD-10-CM | POA: Diagnosis not present

## 2017-09-14 DIAGNOSIS — Z992 Dependence on renal dialysis: Secondary | ICD-10-CM | POA: Diagnosis not present

## 2017-09-14 DIAGNOSIS — N186 End stage renal disease: Secondary | ICD-10-CM | POA: Diagnosis not present

## 2017-09-14 DIAGNOSIS — D509 Iron deficiency anemia, unspecified: Secondary | ICD-10-CM | POA: Diagnosis not present

## 2017-09-14 DIAGNOSIS — D631 Anemia in chronic kidney disease: Secondary | ICD-10-CM | POA: Diagnosis not present

## 2017-09-16 DIAGNOSIS — N2581 Secondary hyperparathyroidism of renal origin: Secondary | ICD-10-CM | POA: Diagnosis not present

## 2017-09-16 DIAGNOSIS — Z992 Dependence on renal dialysis: Secondary | ICD-10-CM | POA: Diagnosis not present

## 2017-09-16 DIAGNOSIS — N186 End stage renal disease: Secondary | ICD-10-CM | POA: Diagnosis not present

## 2017-09-16 DIAGNOSIS — D631 Anemia in chronic kidney disease: Secondary | ICD-10-CM | POA: Diagnosis not present

## 2017-09-16 DIAGNOSIS — D509 Iron deficiency anemia, unspecified: Secondary | ICD-10-CM | POA: Diagnosis not present

## 2017-09-17 DIAGNOSIS — N186 End stage renal disease: Secondary | ICD-10-CM | POA: Diagnosis not present

## 2017-09-17 DIAGNOSIS — D509 Iron deficiency anemia, unspecified: Secondary | ICD-10-CM | POA: Diagnosis not present

## 2017-09-17 DIAGNOSIS — N2581 Secondary hyperparathyroidism of renal origin: Secondary | ICD-10-CM | POA: Diagnosis not present

## 2017-09-17 DIAGNOSIS — D631 Anemia in chronic kidney disease: Secondary | ICD-10-CM | POA: Diagnosis not present

## 2017-09-17 DIAGNOSIS — Z992 Dependence on renal dialysis: Secondary | ICD-10-CM | POA: Diagnosis not present

## 2017-09-18 DIAGNOSIS — Z992 Dependence on renal dialysis: Secondary | ICD-10-CM | POA: Diagnosis not present

## 2017-09-18 DIAGNOSIS — D509 Iron deficiency anemia, unspecified: Secondary | ICD-10-CM | POA: Diagnosis not present

## 2017-09-18 DIAGNOSIS — D631 Anemia in chronic kidney disease: Secondary | ICD-10-CM | POA: Diagnosis not present

## 2017-09-18 DIAGNOSIS — N186 End stage renal disease: Secondary | ICD-10-CM | POA: Diagnosis not present

## 2017-09-18 DIAGNOSIS — N2581 Secondary hyperparathyroidism of renal origin: Secondary | ICD-10-CM | POA: Diagnosis not present

## 2017-09-21 DIAGNOSIS — N2581 Secondary hyperparathyroidism of renal origin: Secondary | ICD-10-CM | POA: Diagnosis not present

## 2017-09-21 DIAGNOSIS — D509 Iron deficiency anemia, unspecified: Secondary | ICD-10-CM | POA: Diagnosis not present

## 2017-09-21 DIAGNOSIS — Z992 Dependence on renal dialysis: Secondary | ICD-10-CM | POA: Diagnosis not present

## 2017-09-21 DIAGNOSIS — N186 End stage renal disease: Secondary | ICD-10-CM | POA: Diagnosis not present

## 2017-09-21 DIAGNOSIS — D631 Anemia in chronic kidney disease: Secondary | ICD-10-CM | POA: Diagnosis not present

## 2017-09-23 DIAGNOSIS — N186 End stage renal disease: Secondary | ICD-10-CM | POA: Diagnosis not present

## 2017-09-23 DIAGNOSIS — D631 Anemia in chronic kidney disease: Secondary | ICD-10-CM | POA: Diagnosis not present

## 2017-09-23 DIAGNOSIS — Z992 Dependence on renal dialysis: Secondary | ICD-10-CM | POA: Diagnosis not present

## 2017-09-23 DIAGNOSIS — D509 Iron deficiency anemia, unspecified: Secondary | ICD-10-CM | POA: Diagnosis not present

## 2017-09-23 DIAGNOSIS — N2581 Secondary hyperparathyroidism of renal origin: Secondary | ICD-10-CM | POA: Diagnosis not present

## 2017-09-25 DIAGNOSIS — N2581 Secondary hyperparathyroidism of renal origin: Secondary | ICD-10-CM | POA: Diagnosis not present

## 2017-09-25 DIAGNOSIS — Z992 Dependence on renal dialysis: Secondary | ICD-10-CM | POA: Diagnosis not present

## 2017-09-25 DIAGNOSIS — N186 End stage renal disease: Secondary | ICD-10-CM | POA: Diagnosis not present

## 2017-09-25 DIAGNOSIS — D631 Anemia in chronic kidney disease: Secondary | ICD-10-CM | POA: Diagnosis not present

## 2017-09-25 DIAGNOSIS — D509 Iron deficiency anemia, unspecified: Secondary | ICD-10-CM | POA: Diagnosis not present

## 2017-09-26 ENCOUNTER — Other Ambulatory Visit: Payer: Self-pay

## 2017-09-26 DIAGNOSIS — J449 Chronic obstructive pulmonary disease, unspecified: Secondary | ICD-10-CM | POA: Insufficient documentation

## 2017-09-26 DIAGNOSIS — E1140 Type 2 diabetes mellitus with diabetic neuropathy, unspecified: Secondary | ICD-10-CM | POA: Insufficient documentation

## 2017-09-26 DIAGNOSIS — Z79899 Other long term (current) drug therapy: Secondary | ICD-10-CM | POA: Insufficient documentation

## 2017-09-26 DIAGNOSIS — D573 Sickle-cell trait: Secondary | ICD-10-CM | POA: Insufficient documentation

## 2017-09-26 DIAGNOSIS — Z992 Dependence on renal dialysis: Secondary | ICD-10-CM | POA: Insufficient documentation

## 2017-09-26 DIAGNOSIS — I252 Old myocardial infarction: Secondary | ICD-10-CM | POA: Insufficient documentation

## 2017-09-26 DIAGNOSIS — Z7982 Long term (current) use of aspirin: Secondary | ICD-10-CM | POA: Diagnosis not present

## 2017-09-26 DIAGNOSIS — I132 Hypertensive heart and chronic kidney disease with heart failure and with stage 5 chronic kidney disease, or end stage renal disease: Secondary | ICD-10-CM | POA: Insufficient documentation

## 2017-09-26 DIAGNOSIS — K922 Gastrointestinal hemorrhage, unspecified: Secondary | ICD-10-CM | POA: Insufficient documentation

## 2017-09-26 DIAGNOSIS — E785 Hyperlipidemia, unspecified: Secondary | ICD-10-CM | POA: Insufficient documentation

## 2017-09-26 DIAGNOSIS — N186 End stage renal disease: Secondary | ICD-10-CM | POA: Insufficient documentation

## 2017-09-26 DIAGNOSIS — I2510 Atherosclerotic heart disease of native coronary artery without angina pectoris: Secondary | ICD-10-CM | POA: Insufficient documentation

## 2017-09-26 DIAGNOSIS — Z87891 Personal history of nicotine dependence: Secondary | ICD-10-CM | POA: Insufficient documentation

## 2017-09-26 DIAGNOSIS — I5032 Chronic diastolic (congestive) heart failure: Secondary | ICD-10-CM | POA: Insufficient documentation

## 2017-09-26 DIAGNOSIS — K921 Melena: Secondary | ICD-10-CM | POA: Diagnosis present

## 2017-09-26 LAB — CBC
HCT: 41 % (ref 35.0–47.0)
Hemoglobin: 13.9 g/dL (ref 12.0–16.0)
MCH: 35 pg — ABNORMAL HIGH (ref 26.0–34.0)
MCHC: 34 g/dL (ref 32.0–36.0)
MCV: 103 fL — ABNORMAL HIGH (ref 80.0–100.0)
PLATELETS: 175 10*3/uL (ref 150–440)
RBC: 3.98 MIL/uL (ref 3.80–5.20)
RDW: 16 % — AB (ref 11.5–14.5)
WBC: 8.3 10*3/uL (ref 3.6–11.0)

## 2017-09-26 LAB — COMPREHENSIVE METABOLIC PANEL
ALK PHOS: 167 U/L — AB (ref 38–126)
ALT: 25 U/L (ref 14–54)
AST: 31 U/L (ref 15–41)
Albumin: 3.9 g/dL (ref 3.5–5.0)
Anion gap: 12 (ref 5–15)
BILIRUBIN TOTAL: 0.7 mg/dL (ref 0.3–1.2)
BUN: 46 mg/dL — AB (ref 6–20)
CHLORIDE: 97 mmol/L — AB (ref 101–111)
CO2: 29 mmol/L (ref 22–32)
CREATININE: 9.01 mg/dL — AB (ref 0.44–1.00)
Calcium: 8.7 mg/dL — ABNORMAL LOW (ref 8.9–10.3)
GFR calc non Af Amer: 4 mL/min — ABNORMAL LOW (ref >60)
GFR, EST AFRICAN AMERICAN: 5 mL/min — AB (ref >60)
Glucose, Bld: 137 mg/dL — ABNORMAL HIGH (ref 65–99)
Potassium: 4.1 mmol/L (ref 3.5–5.1)
Sodium: 138 mmol/L (ref 135–145)
TOTAL PROTEIN: 7.4 g/dL (ref 6.5–8.1)

## 2017-09-26 NOTE — ED Triage Notes (Signed)
Patient reports having black stool for 3 days.

## 2017-09-26 NOTE — ED Notes (Signed)
Lab notified to come draw patient.

## 2017-09-27 ENCOUNTER — Emergency Department
Admission: EM | Admit: 2017-09-27 | Discharge: 2017-09-27 | Disposition: A | Discharge status: Home / Self Care | Payer: Medicare Other | Attending: Emergency Medicine | Admitting: Emergency Medicine

## 2017-09-27 DIAGNOSIS — K922 Gastrointestinal hemorrhage, unspecified: Secondary | ICD-10-CM

## 2017-09-27 MED ORDER — PANTOPRAZOLE SODIUM 40 MG PO TBEC: 40.0000 mg | DELAYED_RELEASE_TABLET | Freq: Every day | ORAL | 0 refills | Status: DC

## 2017-09-27 MED ORDER — PANTOPRAZOLE SODIUM 40 MG PO TBEC
40.0000 mg | DELAYED_RELEASE_TABLET | Freq: Once | ORAL | Status: AC
  Administered 2017-09-27: 40 mg via ORAL
  Filled 2017-09-27: qty 1

## 2017-09-27 NOTE — Discharge Instructions (Signed)
Please begin taking Protonix every single day for the next month and follow-up with gastroenterology within a week or so for reevaluation.  Return to the emergency department sooner for any new or worsening symptoms such as fevers, chills, worsening pain, worsening bleeding, or for any other issues whatsoever.  It was a pleasure to take care of you today, and thank you for coming to our emergency department.  If you have any questions or concerns before leaving please ask the nurse to grab me and I'm more than happy to go through your aftercare instructions again.  If you were prescribed any opioid pain medication today such as Norco, Vicodin, Percocet, morphine, hydrocodone, or oxycodone please make sure you do not drive when you are taking this medication as it can alter your ability to drive safely.  If you have any concerns once you are home that you are not improving or are in fact getting worse before you can make it to your follow-up appointment, please do not hesitate to call 911 and come back for further evaluation.  Darel Hong, MD  Results for orders placed or performed during the hospital encounter of 09/27/17  Comprehensive metabolic panel  Result Value Ref Range   Sodium 138 135 - 145 mmol/L   Potassium 4.1 3.5 - 5.1 mmol/L   Chloride 97 (L) 101 - 111 mmol/L   CO2 29 22 - 32 mmol/L   Glucose, Bld 137 (H) 65 - 99 mg/dL   BUN 46 (H) 6 - 20 mg/dL   Creatinine, Ser 9.01 (H) 0.44 - 1.00 mg/dL   Calcium 8.7 (L) 8.9 - 10.3 mg/dL   Total Protein 7.4 6.5 - 8.1 g/dL   Albumin 3.9 3.5 - 5.0 g/dL   AST 31 15 - 41 U/L   ALT 25 14 - 54 U/L   Alkaline Phosphatase 167 (H) 38 - 126 U/L   Total Bilirubin 0.7 0.3 - 1.2 mg/dL   GFR calc non Af Amer 4 (L) >60 mL/min   GFR calc Af Amer 5 (L) >60 mL/min   Anion gap 12 5 - 15  CBC  Result Value Ref Range   WBC 8.3 3.6 - 11.0 K/uL   RBC 3.98 3.80 - 5.20 MIL/uL   Hemoglobin 13.9 12.0 - 16.0 g/dL   HCT 41.0 35.0 - 47.0 %   MCV 103.0 (H)  80.0 - 100.0 fL   MCH 35.0 (H) 26.0 - 34.0 pg   MCHC 34.0 32.0 - 36.0 g/dL   RDW 16.0 (H) 11.5 - 14.5 %   Platelets 175 150 - 440 K/uL

## 2017-09-27 NOTE — ED Notes (Signed)
Patient c/o  Chronic lower back pain and dark stools for 3 days.

## 2017-09-27 NOTE — ED Notes (Signed)
Reviewed discharge instructions, follow-up care, and prescriptions with patient. Patient verbalized understanding of all information reviewed. Patient stable, with no distress noted at this time.    

## 2017-09-27 NOTE — ED Provider Notes (Signed)
[facility] Emergency Department Provider Note  ____________________________________________   First MD Initiated Contact with Patient 09/27/17 [phone]     (approximate)  I have reviewed the triage vital signs and the nursing notes.   HISTORY  Chief Complaint Melena    HPI Theresa [name] is a 62 y.o. female who self presents to the emergency department with 3 days of black stools.  Mild upper abdominal discomfort.  No fevers or chills.  No chest pain or shortness of breath.  Some nausea no vomiting.  She does take 81 mg of aspirin a day.  Denies taking other nonsteroidals.  No history of GI bleed.  Does have a family history of colon cancer.  Most recently had a colonoscopy last month showing multiple polyps.  Does not take any antacids.  Symptoms have been gradual onset intermittent worse with defecation improving not defecating.  Past Medical History:  Diagnosis Date  . Anemia of chronic disease   . Carotid arterial disease ([place])    a. 02/2013 U/S: 40-59% bilat ICA stenosis - *f/u 02/2014*  . Chronic constipation   . Chronic diastolic CHF (congestive heart failure) ([place])    a. 10/2013 Echo [facility]): EF 55-60%, mod conc LVH, mod MR, mildly dil LA, mild Ao sclerosis w/o stenosis.  . Colon polyps   . COPD (chronic obstructive pulmonary disease) ([place])   . Coronary artery disease    a. 05/2013 NSTEMI/PCI: LM 20d, LAD min irregs, LCX small, nl, OM1 nl, RCA dom 42m (2.5x16 Promus DES), PDA1 80p.  . Diabetes mellitus   . Diabetic neuropathy ([place])   . Diabetic retinopathy ([place]) 05/28/2013   Hx bilat retinal detachment, proliferative diab retinopathy and bilat vitreous hemorrhage   . Emphysema   . ESRD on hemodialysis ([place])    a. [facility] in [place], [place] [phone]/Dr. [name], on a MWF schedule.  She started dialysis in Feb 2014.  Etiology of renal failure not known, likely diabetes.  Has a left upper arm AV graft.  . History of bronchitis    Mar 2012  . History  of pneumonia    June 2012  . History of tobacco abuse    a. Quit 2012.  Marland Kitchen Hyperlipidemia   . Hypertension   . Moderate mitral insufficiency    a. 10/2013 Echo: EF 55-60%, mod MR.  . Myocardial infarct ([place]) 05/2013  . Peripheral vascular disease ([place])   . Renal insufficiency   . Sickle cell trait ([place])   . Thyroid nodule    [place] 06/2017 due to f/u US in 1 year     Patient Active Problem List   Diagnosis Date Noted  . Thyroid nodule 08/11/2017  . CHF (congestive heart failure) ([place]) 07/26/2017  . PAD (peripheral artery disease) ([place]) 03/07/2017  . Bilateral carotid artery stenosis 02/17/2017  . Complication of vascular access for dialysis 06/19/2016  . Atypical chest pain 05/21/2016  . Mitral regurgitation 02/06/2016  . Screening for breast cancer 07/27/2015  . Dysuria 07/27/2015  . S/P coronary artery stent placement   . Leg pain 03/05/2015  . Neuritis or radiculitis due to rupture of lumbar intervertebral disc 11/28/2014  . Routine general medical examination at a health care facility 09/02/2013  . Osteoarthritis of right hip 08/23/2013  . DDD (degenerative disc disease), lumbosacral 08/23/2013  . Coronary artery disease of native artery of native heart with stable angina pectoris ([place]) 06/27/2013  . Diabetic retinopathy ([place]) 05/28/2013  . Hyperlipidemia   . Syncope 01/21/2013  .  Essential (primary) hypertension 11/08/2012  . Diabetic neuropathy ([place]) 02/18/2012  . Anemia in chronic kidney disease (CKD) 02/18/2012  . ESRD on hemodialysis ([place]) 08/21/2011  . Diabetes mellitus, type II ([place]) 06/30/2011  . COPD (chronic obstructive pulmonary disease) ([place]) 06/13/2011    Past Surgical History:  Procedure Laterality Date  . ABDOMINAL HYSTERECTOMY     2000  . CARDIAC CATHETERIZATION    . CARDIAC CATHETERIZATION N/A 05/02/2015   Procedure: Left Heart Cath and Coronary Angiography;  Surgeon: [name], MD;  Location: [facility];  Service: Cardiovascular;   Laterality: N/A;  . COLONOSCOPY WITH PROPOFOL N/A 07/08/2016   Procedure: COLONOSCOPY WITH PROPOFOL;  Surgeon: [name], MD;  Location: [facility] ENDOSCOPY;  Service: Endoscopy;  Laterality: N/A;  . COLONOSCOPY WITH PROPOFOL N/A 08/20/2017   Procedure: COLONOSCOPY WITH PROPOFOL;  Surgeon: [name], MD;  Location: [facility] ENDOSCOPY;  Service: Gastroenterology;  Laterality: N/A;  . colonscopy    . CORONARY ANGIOPLASTY  05/28/2014   stent placement to the mid RCA  . DILATION AND CURETTAGE OF UTERUS     several in the early 80's  . ESOPHAGOGASTRODUODENOSCOPY     2012  . EYE SURGERY     bilateral laser 2012  . EYE SURGERY     right  . EYE SURGERY     x4 both eyes  . GAS INSERTION  09/30/2011   Procedure: INSERTION OF GAS;  Surgeon: [name], MD;  Location: [facility];  Service: Ophthalmology;  Laterality: Right;  [id number]  . GAS/FLUID EXCHANGE  09/30/2011   Procedure: GAS/FLUID EXCHANGE;  Surgeon: [name], MD;  Location: [place];  Service: Ophthalmology;  Laterality: Right;  . LEFT HEART CATHETERIZATION WITH CORONARY ANGIOGRAM N/A 05/28/2013   Procedure: LEFT HEART CATHETERIZATION WITH CORONARY ANGIOGRAM;  Surgeon: [name], MD;  Location: [facility] CATH LAB;  Service: Cardiovascular;  Laterality: N/A;  . PARS PLANA VITRECTOMY  04/22/2011   Procedure: PARS PLANA VITRECTOMY WITH 25 GAUGE;  Surgeon: [name], MD;  Location: [place] [facility];  Service: Ophthalmology;  Laterality: Left;  membrane peel, endolaser, gas fluid exchange, silicone oil, repair of complex traction retinal detachment  . PARS PLANA VITRECTOMY  09/30/2011   Procedure: PARS PLANA VITRECTOMY WITH 25 GAUGE;  Surgeon: [name], MD;  Location: [place] [facility];  Service: Ophthalmology;  Laterality: Right;  Endolaser; Repair of Complex Traction Retinal Detachment  . PARS PLANA VITRECTOMY  02/24/2012   Procedure: PARS PLANA VITRECTOMY WITH 25 GAUGE;  Surgeon: [name], MD;  Location: [place];  Service: Ophthalmology;  Laterality:  Left;  . PTCA    . SILICON OIL REMOVAL  5/36/1443   Procedure: SILICON OIL REMOVAL;  Surgeon: [name], MD;  Location: [facility];  Service: Ophthalmology;  Laterality: Left;  . THROMBECTOMY / ARTERIOVENOUS GRAFT REVISION    . TUBAL LIGATION     1979    Prior to Admission medications   Medication Sig Start Date End Date Taking? Authorizing Provider  albuterol (PROVENTIL HFA;VENTOLIN HFA) 108 (90 Base) MCG/ACT inhaler Inhale 2 puffs into the lungs every 6 (six) hours as needed for wheezing or shortness of breath. 09/10/17   Alisa Graff, FNP  albuterol (PROVENTIL) (2.5 MG/3ML) 0.083% nebulizer solution Take 2.5 mg by nebulization every 4 (four) hours as needed for wheezing or shortness of breath.    [provider]  amLODipine (NORVASC) 10 MG tablet Take 10 mg by mouth daily.  09/17/16   [provider]  aspirin EC 81 MG tablet Take 81 mg by mouth daily.    [provider]  atorvastatin (LIPITOR) 40 MG tablet Take 1 tablet (40 mg total) by mouth daily. 07/03/16 03/11/18  Darylene Price A, FNP  carvedilol (COREG) 12.5 MG tablet IF BLOOD PRESSURE GREATER THAN 160, TAKE 2 TABLETS BY MOUTH TWICE DAILY, OTHERWISE TAKE 1 TABLET BY MOUTH TWICE DAILY. 03/02/17   Alisa Graff, FNP  cinacalcet (SENSIPAR) 30 MG tablet Take 30 mg by mouth daily.    [provider]  cloNIDine (CATAPRES) 0.1 MG tablet Take 0.1 mg by mouth 2 (two) times daily.    [provider]  furosemide (LASIX) 80 MG tablet Take 80 mg by mouth daily.     [provider]  isosorbide mononitrate (IMDUR) 60 MG 24 hr tablet Take 1 tablet (60 mg total) by mouth daily. 07/28/17   Max Sane, MD  losartan (COZAAR) 100 MG tablet Take 1 tablet (100 mg total) by mouth daily. Patient taking differently: Take 50 mg by mouth daily.  07/28/17   Max Sane, MD  Melatonin 10 MG TABS Take 10 mg by mouth daily as needed (SLEEP).     [provider]  multivitamin (RENA-VIT) TABS tablet TAKE  ONE TABLET BY MOUTH ONCE DAILY 11/09/16   Darylene Price A, FNP  nitroGLYCERIN (NITROSTAT) 0.4 MG SL tablet Place 1 tablet (0.4 mg total) under the tongue every 5 (five) minutes as needed for chest pain. 01/15/17   Burnard Hawthorne, FNP  ondansetron (ZOFRAN-ODT) 8 MG disintegrating tablet Take 8 mg by mouth every 8 (eight) hours as needed for nausea or vomiting.    [provider]  pantoprazole (PROTONIX) 40 MG tablet Take 1 tablet (40 mg total) by mouth daily. 09/27/17 09/27/18  Darel Hong, MD  pregabalin (LYRICA) 75 MG capsule Take 1 capsule (75 mg total) by mouth daily. 04/21/16   Burnard Hawthorne, FNP    Allergies Hydrocodone; Metformin; and Lisinopril  Family History  Problem Relation Age of Onset  . Stroke Mother   . Heart attack Mother   . Heart disease Mother   . Colon cancer Father   . Colon cancer Sister   . Heart attack Brother   . Stroke Brother   . Diabetes Brother   . Breast cancer Sister   . Diabetes Brother   . Diabetes Daughter   . Anesthesia problems Neg Hx   . Hypotension Neg Hx   . Malignant hyperthermia Neg Hx   . Pseudochol deficiency Neg Hx     Social History Social History   Tobacco Use  . Smoking status: Former Smoker    Packs/day: 1.00    Years: 25.00    Pack years: 25.00    Last attempt to quit: 06/09/2010    Years since quitting: 7.3  . Smokeless tobacco: Never Used  Substance Use Topics  . Alcohol use: No  . Drug use: No    Review of Systems Constitutional: No fever/chills Eyes: No visual changes. ENT: No sore throat. Cardiovascular: Denies chest pain. Respiratory: Denies shortness of breath. Gastrointestinal: Positive for abdominal pain.  Positive for nausea, no vomiting.  No diarrhea.  No constipation. Genitourinary: Negative for dysuria. Musculoskeletal: Negative for back pain. Skin: Negative for rash. Neurological: Negative for headaches, focal weakness or  numbness.   ____________________________________________   PHYSICAL EXAM:  VITAL SIGNS: ED Triage Vitals [09/26/17 2057]  Enc Vitals Group     BP (!) 185/60     Pulse Rate  78     Resp 20     Temp 98.1 F (36.7 C)     Temp Source Oral     SpO2 100 %     Weight      Height      Head Circumference      Peak Flow      Pain Score      Pain Loc      Pain Edu?      Excl. in [place]?     Constitutional: Alert and oriented x4 pleasant cooperative speaks full clear sentences no diaphoresis Eyes: PERRL EOMI. Head: Atraumatic. Nose: No congestion/rhinnorhea. Mouth/Throat: No trismus Neck: No stridor.   Cardiovascular: Normal rate, regular rhythm. Grossly normal heart sounds.  Good peripheral circulation. Respiratory: Normal respiratory effort.  No retractions. Lungs CTAB and moving good air Gastrointestinal: Soft mild diffuse upper abdominal tenderness no rebound or guarding no peritonitis Rectal exam performed with female nurse chaperone shows frank melena Musculoskeletal: No lower extremity edema   Neurologic:  Normal speech and language. No gross focal neurologic deficits are appreciated. Skin:  Skin is warm, dry and intact. No rash noted. Psychiatric: Mood and affect are normal. Speech and behavior are normal.    ____________________________________________   DIFFERENTIAL includes but not limited to  Upper GI bleed, lower GI bleed, abdominal aortic aneurysm ____________________________________________   LABS (all labs ordered are listed, but only abnormal results are displayed)  Labs Reviewed  COMPREHENSIVE METABOLIC PANEL - Abnormal; Notable for the following components:      Result Value   Chloride 97 (*)    Glucose, Bld 137 (*)    BUN 46 (*)    Creatinine, Ser 9.01 (*)    Calcium 8.7 (*)    Alkaline Phosphatase 167 (*)    GFR calc non Af Amer 4 (*)    GFR calc Af Amer 5 (*)    All other components within normal limits  CBC - Abnormal; Notable for the following  components:   MCV 103.0 (*)    MCH 35.0 (*)    RDW 16.0 (*)    All other components within normal limits    Lab work reviewed by me shows elevated MCV but no evidence of anemia __________________________________________  EKG   ____________________________________________  RADIOLOGY   ____________________________________________   PROCEDURES  Procedure(s) performed: no  Procedures  Critical Care performed: no  Observation: no ____________________________________________   INITIAL IMPRESSION / ASSESSMENT AND PLAN / ED COURSE  Pertinent labs & imaging results that were available during my care of the patient were reviewed by me and considered in my medical decision making (see chart for details).  The patient arrives hemodynamically stable and relatively well-appearing with a benign abdominal exam.  Does have frank melena.  No indication for emergent transfusion.  I have advised the patient to refrain from nonsteroidals and will initiate on a PPI as an outpatient.  The patient likely does warrant an outpatient endoscopy.  I will refer to GI.  Strict return precautions have been given and patient verbalizes understanding and agreement with plan.      ____________________________________________   FINAL CLINICAL IMPRESSION(S) / ED DIAGNOSES  Final diagnoses:  Gastrointestinal hemorrhage, unspecified gastrointestinal hemorrhage type      NEW MEDICATIONS STARTED DURING THIS VISIT:  Discharge Medication List as of 09/27/2017  2:34 AM    START taking these medications   Details  pantoprazole (PROTONIX) 40 MG tablet Take 1 tablet (40 mg total) by mouth daily., Starting Sun  09/27/2017, Until Mon 09/27/2018, Print         Note:  This document was prepared using Dragon voice recognition software and may include unintentional dictation errors.     Darel Hong, MD 10/01/17 2158

## 2017-09-28 DIAGNOSIS — Z992 Dependence on renal dialysis: Secondary | ICD-10-CM | POA: Diagnosis not present

## 2017-09-28 DIAGNOSIS — D509 Iron deficiency anemia, unspecified: Secondary | ICD-10-CM | POA: Diagnosis not present

## 2017-09-28 DIAGNOSIS — D631 Anemia in chronic kidney disease: Secondary | ICD-10-CM | POA: Diagnosis not present

## 2017-09-28 DIAGNOSIS — N2581 Secondary hyperparathyroidism of renal origin: Secondary | ICD-10-CM | POA: Diagnosis not present

## 2017-09-28 DIAGNOSIS — N186 End stage renal disease: Secondary | ICD-10-CM | POA: Diagnosis not present

## 2017-09-29 DIAGNOSIS — M5136 Other intervertebral disc degeneration, lumbar region: Secondary | ICD-10-CM | POA: Diagnosis not present

## 2017-09-29 DIAGNOSIS — M5416 Radiculopathy, lumbar region: Secondary | ICD-10-CM | POA: Diagnosis not present

## 2017-09-30 DIAGNOSIS — D509 Iron deficiency anemia, unspecified: Secondary | ICD-10-CM | POA: Diagnosis not present

## 2017-09-30 DIAGNOSIS — Z992 Dependence on renal dialysis: Secondary | ICD-10-CM | POA: Diagnosis not present

## 2017-09-30 DIAGNOSIS — D631 Anemia in chronic kidney disease: Secondary | ICD-10-CM | POA: Diagnosis not present

## 2017-09-30 DIAGNOSIS — N186 End stage renal disease: Secondary | ICD-10-CM | POA: Diagnosis not present

## 2017-09-30 DIAGNOSIS — N2581 Secondary hyperparathyroidism of renal origin: Secondary | ICD-10-CM | POA: Diagnosis not present

## 2017-10-02 DIAGNOSIS — N186 End stage renal disease: Secondary | ICD-10-CM | POA: Diagnosis not present

## 2017-10-02 DIAGNOSIS — N2581 Secondary hyperparathyroidism of renal origin: Secondary | ICD-10-CM | POA: Diagnosis not present

## 2017-10-02 DIAGNOSIS — D631 Anemia in chronic kidney disease: Secondary | ICD-10-CM | POA: Diagnosis not present

## 2017-10-02 DIAGNOSIS — Z992 Dependence on renal dialysis: Secondary | ICD-10-CM | POA: Diagnosis not present

## 2017-10-02 DIAGNOSIS — D509 Iron deficiency anemia, unspecified: Secondary | ICD-10-CM | POA: Diagnosis not present

## 2017-10-05 DIAGNOSIS — D631 Anemia in chronic kidney disease: Secondary | ICD-10-CM | POA: Diagnosis not present

## 2017-10-05 DIAGNOSIS — D509 Iron deficiency anemia, unspecified: Secondary | ICD-10-CM | POA: Diagnosis not present

## 2017-10-05 DIAGNOSIS — N2581 Secondary hyperparathyroidism of renal origin: Secondary | ICD-10-CM | POA: Diagnosis not present

## 2017-10-05 DIAGNOSIS — Z992 Dependence on renal dialysis: Secondary | ICD-10-CM | POA: Diagnosis not present

## 2017-10-05 DIAGNOSIS — N186 End stage renal disease: Secondary | ICD-10-CM | POA: Diagnosis not present

## 2017-10-06 DIAGNOSIS — Z992 Dependence on renal dialysis: Secondary | ICD-10-CM | POA: Diagnosis not present

## 2017-10-06 DIAGNOSIS — N186 End stage renal disease: Secondary | ICD-10-CM | POA: Diagnosis not present

## 2017-10-07 DIAGNOSIS — N186 End stage renal disease: Secondary | ICD-10-CM | POA: Diagnosis not present

## 2017-10-07 DIAGNOSIS — D631 Anemia in chronic kidney disease: Secondary | ICD-10-CM | POA: Diagnosis not present

## 2017-10-07 DIAGNOSIS — Z992 Dependence on renal dialysis: Secondary | ICD-10-CM | POA: Diagnosis not present

## 2017-10-07 DIAGNOSIS — D509 Iron deficiency anemia, unspecified: Secondary | ICD-10-CM | POA: Diagnosis not present

## 2017-10-07 DIAGNOSIS — N2581 Secondary hyperparathyroidism of renal origin: Secondary | ICD-10-CM | POA: Diagnosis not present

## 2017-10-08 ENCOUNTER — Ambulatory Visit: Payer: Medicare Other | Admitting: Podiatry

## 2017-10-09 DIAGNOSIS — N186 End stage renal disease: Secondary | ICD-10-CM | POA: Diagnosis not present

## 2017-10-09 DIAGNOSIS — D509 Iron deficiency anemia, unspecified: Secondary | ICD-10-CM | POA: Diagnosis not present

## 2017-10-09 DIAGNOSIS — Z992 Dependence on renal dialysis: Secondary | ICD-10-CM | POA: Diagnosis not present

## 2017-10-09 DIAGNOSIS — D631 Anemia in chronic kidney disease: Secondary | ICD-10-CM | POA: Diagnosis not present

## 2017-10-09 DIAGNOSIS — N2581 Secondary hyperparathyroidism of renal origin: Secondary | ICD-10-CM | POA: Diagnosis not present

## 2017-10-12 DIAGNOSIS — D631 Anemia in chronic kidney disease: Secondary | ICD-10-CM | POA: Diagnosis not present

## 2017-10-12 DIAGNOSIS — N186 End stage renal disease: Secondary | ICD-10-CM | POA: Diagnosis not present

## 2017-10-12 DIAGNOSIS — D509 Iron deficiency anemia, unspecified: Secondary | ICD-10-CM | POA: Diagnosis not present

## 2017-10-12 DIAGNOSIS — N2581 Secondary hyperparathyroidism of renal origin: Secondary | ICD-10-CM | POA: Diagnosis not present

## 2017-10-12 DIAGNOSIS — Z992 Dependence on renal dialysis: Secondary | ICD-10-CM | POA: Diagnosis not present

## 2017-10-14 DIAGNOSIS — D631 Anemia in chronic kidney disease: Secondary | ICD-10-CM | POA: Diagnosis not present

## 2017-10-14 DIAGNOSIS — N186 End stage renal disease: Secondary | ICD-10-CM | POA: Diagnosis not present

## 2017-10-14 DIAGNOSIS — Z992 Dependence on renal dialysis: Secondary | ICD-10-CM | POA: Diagnosis not present

## 2017-10-14 DIAGNOSIS — N2581 Secondary hyperparathyroidism of renal origin: Secondary | ICD-10-CM | POA: Diagnosis not present

## 2017-10-14 DIAGNOSIS — D509 Iron deficiency anemia, unspecified: Secondary | ICD-10-CM | POA: Diagnosis not present

## 2017-10-16 DIAGNOSIS — D509 Iron deficiency anemia, unspecified: Secondary | ICD-10-CM | POA: Diagnosis not present

## 2017-10-16 DIAGNOSIS — N2581 Secondary hyperparathyroidism of renal origin: Secondary | ICD-10-CM | POA: Diagnosis not present

## 2017-10-16 DIAGNOSIS — N186 End stage renal disease: Secondary | ICD-10-CM | POA: Diagnosis not present

## 2017-10-16 DIAGNOSIS — D631 Anemia in chronic kidney disease: Secondary | ICD-10-CM | POA: Diagnosis not present

## 2017-10-16 DIAGNOSIS — Z992 Dependence on renal dialysis: Secondary | ICD-10-CM | POA: Diagnosis not present

## 2017-10-19 ENCOUNTER — Encounter: Admit: 2017-10-19 | Discharge: 2017-10-19 | Payer: MEDICARE | Attending: Nephrology | Primary: Nephrology

## 2017-10-19 DIAGNOSIS — Z992 Dependence on renal dialysis: Secondary | ICD-10-CM | POA: Diagnosis not present

## 2017-10-19 DIAGNOSIS — N2581 Secondary hyperparathyroidism of renal origin: Secondary | ICD-10-CM | POA: Diagnosis not present

## 2017-10-19 DIAGNOSIS — N186 End stage renal disease: Secondary | ICD-10-CM | POA: Diagnosis not present

## 2017-10-19 DIAGNOSIS — D631 Anemia in chronic kidney disease: Secondary | ICD-10-CM | POA: Diagnosis not present

## 2017-10-19 DIAGNOSIS — D509 Iron deficiency anemia, unspecified: Secondary | ICD-10-CM | POA: Diagnosis not present

## 2017-10-21 DIAGNOSIS — N186 End stage renal disease: Secondary | ICD-10-CM | POA: Diagnosis not present

## 2017-10-21 DIAGNOSIS — D509 Iron deficiency anemia, unspecified: Secondary | ICD-10-CM | POA: Diagnosis not present

## 2017-10-21 DIAGNOSIS — N2581 Secondary hyperparathyroidism of renal origin: Secondary | ICD-10-CM | POA: Diagnosis not present

## 2017-10-21 DIAGNOSIS — Z992 Dependence on renal dialysis: Secondary | ICD-10-CM | POA: Diagnosis not present

## 2017-10-21 DIAGNOSIS — D631 Anemia in chronic kidney disease: Secondary | ICD-10-CM | POA: Diagnosis not present

## 2017-10-22 ENCOUNTER — Encounter: Payer: Self-pay | Admitting: Podiatry

## 2017-10-22 ENCOUNTER — Ambulatory Visit (INDEPENDENT_AMBULATORY_CARE_PROVIDER_SITE_OTHER): Payer: Medicare Other | Admitting: Podiatry

## 2017-10-22 DIAGNOSIS — E1142 Type 2 diabetes mellitus with diabetic polyneuropathy: Secondary | ICD-10-CM

## 2017-10-22 DIAGNOSIS — E1140 Type 2 diabetes mellitus with diabetic neuropathy, unspecified: Secondary | ICD-10-CM

## 2017-10-22 DIAGNOSIS — Z794 Long term (current) use of insulin: Secondary | ICD-10-CM | POA: Diagnosis not present

## 2017-10-22 DIAGNOSIS — N186 End stage renal disease: Secondary | ICD-10-CM | POA: Diagnosis not present

## 2017-10-22 DIAGNOSIS — I739 Peripheral vascular disease, unspecified: Secondary | ICD-10-CM | POA: Diagnosis not present

## 2017-10-22 DIAGNOSIS — Z992 Dependence on renal dialysis: Secondary | ICD-10-CM | POA: Diagnosis not present

## 2017-10-22 DIAGNOSIS — B351 Tinea unguium: Secondary | ICD-10-CM | POA: Diagnosis not present

## 2017-10-22 DIAGNOSIS — E1122 Type 2 diabetes mellitus with diabetic chronic kidney disease: Secondary | ICD-10-CM

## 2017-10-22 DIAGNOSIS — L608 Other nail disorders: Secondary | ICD-10-CM

## 2017-10-22 DIAGNOSIS — M79609 Pain in unspecified limb: Secondary | ICD-10-CM | POA: Diagnosis not present

## 2017-10-22 NOTE — Progress Notes (Signed)
Complaint:  Visit Type: Patient returns to my office for continued preventative foot care services. Complaint: Patient states" my nails have grown long and thick and become painful to walk and wear shoes" Patient has been diagnosed with DM with no foot complications. The patient presents for preventative foot care services. No changes to ROS  Podiatric Exam: Vascular: dorsalis pedis and posterior tibial pulses are palpable bilateral. Capillary return is immediate. Temperature gradient is WNL. Skin turgor WNL  Sensorium: Normal Semmes Weinstein monofilament test. Normal tactile sensation bilaterally. Nail Exam: Pt has thick disfigured discolored nails with subungual debris noted bilateral entire nail hallux through fifth toenails.  Pincer nails  B/L Ulcer Exam: There is no evidence of ulcer or pre-ulcerative changes or infection. Orthopedic Exam: Muscle tone and strength are WNL. No limitations in general ROM. No crepitus or effusions noted. Foot type and digits show no abnormalities. HAV  B/L Skin: No Porokeratosis. No infection or ulcers.  S/P ulcer healing dorsally both feet.  Diagnosis:  Onychomycosis, , Pain in right toe, pain in left toes  Treatment & Plan Procedures and Treatment: Consent by patient was obtained for treatment procedures. The patient understood the discussion of treatment and procedures well. All questions were answered thoroughly reviewed. Debridement of mycotic and hypertrophic toenails, 1 through 5 bilateral and clearing of subungual debris. No ulceration, no infection noted. ABN signed for 2019. Return Visit-Office Procedure: Patient instructed to return to the office for a follow up visit 3 months for continued evaluation and treatment.    Rashae Rother DPM 

## 2017-10-23 DIAGNOSIS — Z01818 Encounter for other preprocedural examination: Secondary | ICD-10-CM

## 2017-10-23 DIAGNOSIS — Z7682 Awaiting organ transplant status: Principal | ICD-10-CM

## 2017-10-23 DIAGNOSIS — N186 End stage renal disease: Secondary | ICD-10-CM

## 2017-10-23 DIAGNOSIS — Z992 Dependence on renal dialysis: Secondary | ICD-10-CM | POA: Diagnosis not present

## 2017-10-23 DIAGNOSIS — D631 Anemia in chronic kidney disease: Secondary | ICD-10-CM | POA: Diagnosis not present

## 2017-10-23 DIAGNOSIS — N2581 Secondary hyperparathyroidism of renal origin: Secondary | ICD-10-CM | POA: Diagnosis not present

## 2017-10-23 DIAGNOSIS — D509 Iron deficiency anemia, unspecified: Secondary | ICD-10-CM | POA: Diagnosis not present

## 2017-10-26 DIAGNOSIS — D509 Iron deficiency anemia, unspecified: Secondary | ICD-10-CM | POA: Diagnosis not present

## 2017-10-26 DIAGNOSIS — N186 End stage renal disease: Secondary | ICD-10-CM | POA: Diagnosis not present

## 2017-10-26 DIAGNOSIS — N2581 Secondary hyperparathyroidism of renal origin: Secondary | ICD-10-CM | POA: Diagnosis not present

## 2017-10-26 DIAGNOSIS — D631 Anemia in chronic kidney disease: Secondary | ICD-10-CM | POA: Diagnosis not present

## 2017-10-26 DIAGNOSIS — Z992 Dependence on renal dialysis: Secondary | ICD-10-CM | POA: Diagnosis not present

## 2017-10-28 DIAGNOSIS — D631 Anemia in chronic kidney disease: Secondary | ICD-10-CM | POA: Diagnosis not present

## 2017-10-28 DIAGNOSIS — N186 End stage renal disease: Secondary | ICD-10-CM | POA: Diagnosis not present

## 2017-10-28 DIAGNOSIS — N2581 Secondary hyperparathyroidism of renal origin: Secondary | ICD-10-CM | POA: Diagnosis not present

## 2017-10-28 DIAGNOSIS — Z992 Dependence on renal dialysis: Secondary | ICD-10-CM | POA: Diagnosis not present

## 2017-10-28 DIAGNOSIS — D509 Iron deficiency anemia, unspecified: Secondary | ICD-10-CM | POA: Diagnosis not present

## 2017-10-29 DIAGNOSIS — M5416 Radiculopathy, lumbar region: Secondary | ICD-10-CM | POA: Diagnosis not present

## 2017-10-29 DIAGNOSIS — M5136 Other intervertebral disc degeneration, lumbar region: Secondary | ICD-10-CM | POA: Diagnosis not present

## 2017-10-30 DIAGNOSIS — Z992 Dependence on renal dialysis: Secondary | ICD-10-CM | POA: Diagnosis not present

## 2017-10-30 DIAGNOSIS — N2581 Secondary hyperparathyroidism of renal origin: Secondary | ICD-10-CM | POA: Diagnosis not present

## 2017-10-30 DIAGNOSIS — D509 Iron deficiency anemia, unspecified: Secondary | ICD-10-CM | POA: Diagnosis not present

## 2017-10-30 DIAGNOSIS — D631 Anemia in chronic kidney disease: Secondary | ICD-10-CM | POA: Diagnosis not present

## 2017-10-30 DIAGNOSIS — N186 End stage renal disease: Secondary | ICD-10-CM | POA: Diagnosis not present

## 2017-11-02 DIAGNOSIS — N186 End stage renal disease: Secondary | ICD-10-CM | POA: Diagnosis not present

## 2017-11-02 DIAGNOSIS — N2581 Secondary hyperparathyroidism of renal origin: Secondary | ICD-10-CM | POA: Diagnosis not present

## 2017-11-02 DIAGNOSIS — D631 Anemia in chronic kidney disease: Secondary | ICD-10-CM | POA: Diagnosis not present

## 2017-11-02 DIAGNOSIS — D509 Iron deficiency anemia, unspecified: Secondary | ICD-10-CM | POA: Diagnosis not present

## 2017-11-02 DIAGNOSIS — Z992 Dependence on renal dialysis: Secondary | ICD-10-CM | POA: Diagnosis not present

## 2017-11-03 ENCOUNTER — Encounter (INDEPENDENT_AMBULATORY_CARE_PROVIDER_SITE_OTHER): Payer: Self-pay

## 2017-11-04 DIAGNOSIS — D509 Iron deficiency anemia, unspecified: Secondary | ICD-10-CM | POA: Diagnosis not present

## 2017-11-04 DIAGNOSIS — Z992 Dependence on renal dialysis: Secondary | ICD-10-CM | POA: Diagnosis not present

## 2017-11-04 DIAGNOSIS — D631 Anemia in chronic kidney disease: Secondary | ICD-10-CM | POA: Diagnosis not present

## 2017-11-04 DIAGNOSIS — N2581 Secondary hyperparathyroidism of renal origin: Secondary | ICD-10-CM | POA: Diagnosis not present

## 2017-11-04 DIAGNOSIS — N186 End stage renal disease: Secondary | ICD-10-CM | POA: Diagnosis not present

## 2017-11-05 ENCOUNTER — Other Ambulatory Visit (INDEPENDENT_AMBULATORY_CARE_PROVIDER_SITE_OTHER): Payer: Self-pay | Admitting: Vascular Surgery

## 2017-11-06 DIAGNOSIS — D631 Anemia in chronic kidney disease: Secondary | ICD-10-CM | POA: Diagnosis not present

## 2017-11-06 DIAGNOSIS — N186 End stage renal disease: Secondary | ICD-10-CM | POA: Diagnosis not present

## 2017-11-06 DIAGNOSIS — Z992 Dependence on renal dialysis: Secondary | ICD-10-CM | POA: Diagnosis not present

## 2017-11-06 DIAGNOSIS — N2581 Secondary hyperparathyroidism of renal origin: Secondary | ICD-10-CM | POA: Diagnosis not present

## 2017-11-06 DIAGNOSIS — D509 Iron deficiency anemia, unspecified: Secondary | ICD-10-CM | POA: Diagnosis not present

## 2017-11-09 DIAGNOSIS — D509 Iron deficiency anemia, unspecified: Secondary | ICD-10-CM | POA: Diagnosis not present

## 2017-11-09 DIAGNOSIS — Z992 Dependence on renal dialysis: Secondary | ICD-10-CM | POA: Diagnosis not present

## 2017-11-09 DIAGNOSIS — N186 End stage renal disease: Secondary | ICD-10-CM | POA: Diagnosis not present

## 2017-11-09 DIAGNOSIS — N2581 Secondary hyperparathyroidism of renal origin: Secondary | ICD-10-CM | POA: Diagnosis not present

## 2017-11-09 DIAGNOSIS — D631 Anemia in chronic kidney disease: Secondary | ICD-10-CM | POA: Diagnosis not present

## 2017-11-09 MED ORDER — CEFAZOLIN SODIUM-DEXTROSE 1-4 GM/50ML-% IV SOLN
1.0000 g | Freq: Once | INTRAVENOUS | Status: AC
  Administered 2017-11-10: 1 g via INTRAVENOUS

## 2017-11-10 ENCOUNTER — Encounter
Admission: RE | Disposition: A | Discharge status: Home / Self Care | Payer: Self-pay | Source: Ambulatory Visit | Attending: Vascular Surgery

## 2017-11-10 ENCOUNTER — Ambulatory Visit
Admission: RE | Admit: 2017-11-10 | Discharge: 2017-11-10 | Disposition: A | Discharge status: Home / Self Care | Payer: Medicare Other | Source: Ambulatory Visit | Attending: Vascular Surgery | Admitting: Vascular Surgery

## 2017-11-10 ENCOUNTER — Encounter: Payer: Self-pay | Admitting: *Deleted

## 2017-11-10 DIAGNOSIS — Z90710 Acquired absence of both cervix and uterus: Secondary | ICD-10-CM | POA: Insufficient documentation

## 2017-11-10 DIAGNOSIS — E785 Hyperlipidemia, unspecified: Secondary | ICD-10-CM | POA: Insufficient documentation

## 2017-11-10 DIAGNOSIS — Z955 Presence of coronary angioplasty implant and graft: Secondary | ICD-10-CM | POA: Diagnosis not present

## 2017-11-10 DIAGNOSIS — Z87891 Personal history of nicotine dependence: Secondary | ICD-10-CM | POA: Insufficient documentation

## 2017-11-10 DIAGNOSIS — E11319 Type 2 diabetes mellitus with unspecified diabetic retinopathy without macular edema: Secondary | ICD-10-CM | POA: Insufficient documentation

## 2017-11-10 DIAGNOSIS — I5032 Chronic diastolic (congestive) heart failure: Secondary | ICD-10-CM | POA: Diagnosis not present

## 2017-11-10 DIAGNOSIS — Z888 Allergy status to other drugs, medicaments and biological substances status: Secondary | ICD-10-CM | POA: Insufficient documentation

## 2017-11-10 DIAGNOSIS — I10 Essential (primary) hypertension: Secondary | ICD-10-CM | POA: Diagnosis not present

## 2017-11-10 DIAGNOSIS — N186 End stage renal disease: Secondary | ICD-10-CM | POA: Insufficient documentation

## 2017-11-10 DIAGNOSIS — T82858A Stenosis of vascular prosthetic devices, implants and grafts, initial encounter: Secondary | ICD-10-CM | POA: Diagnosis not present

## 2017-11-10 DIAGNOSIS — I132 Hypertensive heart and chronic kidney disease with heart failure and with stage 5 chronic kidney disease, or end stage renal disease: Secondary | ICD-10-CM | POA: Diagnosis not present

## 2017-11-10 DIAGNOSIS — Z8249 Family history of ischemic heart disease and other diseases of the circulatory system: Secondary | ICD-10-CM | POA: Diagnosis not present

## 2017-11-10 DIAGNOSIS — Z885 Allergy status to narcotic agent status: Secondary | ICD-10-CM | POA: Diagnosis not present

## 2017-11-10 DIAGNOSIS — Z992 Dependence on renal dialysis: Secondary | ICD-10-CM | POA: Insufficient documentation

## 2017-11-10 DIAGNOSIS — Z86010 Personal history of colonic polyps: Secondary | ICD-10-CM | POA: Diagnosis not present

## 2017-11-10 DIAGNOSIS — J449 Chronic obstructive pulmonary disease, unspecified: Secondary | ICD-10-CM | POA: Insufficient documentation

## 2017-11-10 DIAGNOSIS — I2510 Atherosclerotic heart disease of native coronary artery without angina pectoris: Secondary | ICD-10-CM | POA: Diagnosis not present

## 2017-11-10 DIAGNOSIS — T82868A Thrombosis of vascular prosthetic devices, implants and grafts, initial encounter: Secondary | ICD-10-CM | POA: Diagnosis not present

## 2017-11-10 DIAGNOSIS — I252 Old myocardial infarction: Secondary | ICD-10-CM | POA: Insufficient documentation

## 2017-11-10 DIAGNOSIS — D571 Sickle-cell disease without crisis: Secondary | ICD-10-CM | POA: Diagnosis not present

## 2017-11-10 DIAGNOSIS — Z9889 Other specified postprocedural states: Secondary | ICD-10-CM | POA: Insufficient documentation

## 2017-11-10 DIAGNOSIS — E1122 Type 2 diabetes mellitus with diabetic chronic kidney disease: Secondary | ICD-10-CM | POA: Insufficient documentation

## 2017-11-10 DIAGNOSIS — E1140 Type 2 diabetes mellitus with diabetic neuropathy, unspecified: Secondary | ICD-10-CM | POA: Diagnosis not present

## 2017-11-10 DIAGNOSIS — Z823 Family history of stroke: Secondary | ICD-10-CM | POA: Diagnosis not present

## 2017-11-10 DIAGNOSIS — E119 Type 2 diabetes mellitus without complications: Secondary | ICD-10-CM | POA: Diagnosis not present

## 2017-11-10 DIAGNOSIS — I6523 Occlusion and stenosis of bilateral carotid arteries: Secondary | ICD-10-CM | POA: Insufficient documentation

## 2017-11-10 DIAGNOSIS — E11649 Type 2 diabetes mellitus with hypoglycemia without coma: Secondary | ICD-10-CM | POA: Insufficient documentation

## 2017-11-10 DIAGNOSIS — E1151 Type 2 diabetes mellitus with diabetic peripheral angiopathy without gangrene: Secondary | ICD-10-CM | POA: Diagnosis not present

## 2017-11-10 DIAGNOSIS — Y832 Surgical operation with anastomosis, bypass or graft as the cause of abnormal reaction of the patient, or of later complication, without mention of misadventure at the time of the procedure: Secondary | ICD-10-CM | POA: Insufficient documentation

## 2017-11-10 HISTORY — PX: A/V FISTULAGRAM: CATH118298

## 2017-11-10 LAB — POTASSIUM (ARMC VASCULAR LAB ONLY): POTASSIUM (ARMC VASCULAR LAB): 4.7 (ref 3.5–5.1)

## 2017-11-10 SURGERY — A/V FISTULAGRAM
Anesthesia: Moderate Sedation | Laterality: Left

## 2017-11-10 MED ORDER — MIDAZOLAM HCL 2 MG/2ML IJ SOLN
INTRAMUSCULAR | Status: DC | PRN
  Administered 2017-11-10: 1 mg via INTRAVENOUS
  Administered 2017-11-10: 2 mg via INTRAVENOUS

## 2017-11-10 MED ORDER — NITROGLYCERIN 1 MG/10 ML FOR IR/CATH LAB
INTRA_ARTERIAL | Status: DC | PRN
  Administered 2017-11-10: 250 ug
  Administered 2017-11-10: 750 ug

## 2017-11-10 MED ORDER — SODIUM CHLORIDE 0.9 % IV SOLN: INTRAVENOUS | Status: DC

## 2017-11-10 MED ORDER — LABETALOL HCL 5 MG/ML IV SOLN
INTRAVENOUS | Status: DC | PRN
  Administered 2017-11-10: 10 mg via INTRAVENOUS

## 2017-11-10 MED ORDER — HEPARIN SODIUM (PORCINE) 1000 UNIT/ML IJ SOLN
INTRAMUSCULAR | Status: AC
  Filled 2017-11-10: qty 1

## 2017-11-10 MED ORDER — FENTANYL CITRATE (PF) 100 MCG/2ML IJ SOLN
INTRAMUSCULAR | Status: DC | PRN
  Administered 2017-11-10: 25 ug via INTRAVENOUS
  Administered 2017-11-10: 50 ug via INTRAVENOUS
  Administered 2017-11-10: 25 ug via INTRAVENOUS
  Administered 2017-11-10: 50 ug via INTRAVENOUS

## 2017-11-10 MED ORDER — HYDROMORPHONE HCL 1 MG/ML IJ SOLN: 1.0000 mg | Freq: Once | INTRAMUSCULAR | Status: DC | PRN

## 2017-11-10 MED ORDER — IOPAMIDOL (ISOVUE-300) INJECTION 61%
INTRAVENOUS | Status: DC | PRN
  Administered 2017-11-10: 60 mL via INTRA_ARTERIAL

## 2017-11-10 MED ORDER — NITROGLYCERIN 5 MG/ML IV SOLN
INTRAVENOUS | Status: AC
  Filled 2017-11-10: qty 10

## 2017-11-10 MED ORDER — LIDOCAINE HCL (PF) 1 % IJ SOLN
INTRAMUSCULAR | Status: AC
  Filled 2017-11-10: qty 30

## 2017-11-10 MED ORDER — HEPARIN (PORCINE) IN NACL 1000-0.9 UT/500ML-% IV SOLN
INTRAVENOUS | Status: AC
  Filled 2017-11-10: qty 1000

## 2017-11-10 MED ORDER — HEPARIN SODIUM (PORCINE) 1000 UNIT/ML IJ SOLN
INTRAMUSCULAR | Status: DC | PRN
  Administered 2017-11-10: 3000 [IU] via INTRAVENOUS

## 2017-11-10 MED ORDER — MIDAZOLAM HCL 5 MG/5ML IJ SOLN
INTRAMUSCULAR | Status: AC
  Filled 2017-11-10: qty 5

## 2017-11-10 MED ORDER — ONDANSETRON HCL 4 MG/2ML IJ SOLN: 4.0000 mg | Freq: Four times a day (QID) | INTRAMUSCULAR | Status: DC | PRN

## 2017-11-10 MED ORDER — FENTANYL CITRATE (PF) 100 MCG/2ML IJ SOLN
INTRAMUSCULAR | Status: AC
  Filled 2017-11-10: qty 2

## 2017-11-10 MED ORDER — FAMOTIDINE 20 MG PO TABS: 40.0000 mg | ORAL_TABLET | ORAL | Status: DC | PRN

## 2017-11-10 MED ORDER — SODIUM CHLORIDE FLUSH 0.9 % IV SOLN
INTRAVENOUS | Status: AC
  Filled 2017-11-10: qty 30

## 2017-11-10 MED ORDER — METHYLPREDNISOLONE SODIUM SUCC 125 MG IJ SOLR: 125.0000 mg | INTRAMUSCULAR | Status: DC | PRN

## 2017-11-10 SURGICAL SUPPLY — 16 items
BALLN DORADO 10X40X80 (BALLOONS) ×2
BALLN LUTONIX AV 10X60X75 (BALLOONS) ×2
BALLN LUTONIX AV 8X60X75 (BALLOONS) ×2
BALLN ULTRASCORE 7X40X130 (BALLOONS) ×2
BALLOON DORADO 10X40X80 (BALLOONS) ×1 IMPLANT
BALLOON LUTONIX AV 10X60X75 (BALLOONS) ×1 IMPLANT
BALLOON LUTONIX AV 8X60X75 (BALLOONS) ×1 IMPLANT
BALLOON ULTRASCORE 7X40X130 (BALLOONS) ×1 IMPLANT
DEVICE PRESTO INFLATION (MISCELLANEOUS) ×2 IMPLANT
NEEDLE ENTRY 21GA 7CM ECHOTIP (NEEDLE) ×2 IMPLANT
PACK ANGIOGRAPHY (CUSTOM PROCEDURE TRAY) ×2 IMPLANT
SET INTRO CAPELLA COAXIAL (SET/KITS/TRAYS/PACK) ×2 IMPLANT
SHEATH BRITE TIP 6FRX5.5 (SHEATH) ×2 IMPLANT
SHEATH BRITE TIP 7FRX5.5 (SHEATH) ×2 IMPLANT
SUT MNCRL AB 4-0 PS2 18 (SUTURE) ×2 IMPLANT
WIRE MAGIC TOR.035 180C (WIRE) ×2 IMPLANT

## 2017-11-10 NOTE — Op Note (Signed)
OPERATIVE NOTE   PROCEDURE: 1. Contrast injection left forearm loop AV access 2. Percutaneous transluminal angioplasty peripheral segment to 8 mm with a Lutonix drug-eluting balloon 3. Percutaneous transluminal angioplasty central venous segment to 10 mm with a Lutonix drug-eluting balloon  PRE-OPERATIVE DIAGNOSIS: Complication of dialysis access                                                       End Stage Renal Disease  POST-OPERATIVE DIAGNOSIS: same as above   SURGEON: Katha Cabal, M.D.  ANESTHESIA: Conscious sedation was administered under my direct supervision by the interventional radiology RN. IV Versed plus fentanyl were utilized. Continuous ECG, pulse oximetry and blood pressure was monitored throughout the entire procedure.  Conscious sedation was for a total of 56.  ESTIMATED BLOOD LOSS: minimal  FINDING(S): Stricture of the AV graft within the peripheral segment as well as a stricture within the central venous portion  SPECIMEN(S):  None  CONTRAST: 60 cc  FLUOROSCOPY TIME: 4.0 minutes  INDICATIONS: Theresa [name] is a 62 y.o. female who  presents with malfunctioning left forearm AV access.  The patient is scheduled for angiography with possible intervention of the AV access.  The patient is aware the risks include but are not limited to: bleeding, infection, thrombosis of the cannulated access, and possible anaphylactic reaction to the contrast.  The patient acknowledges if the access can not be salvaged a tunneled catheter will be needed and will be placed during this procedure.  The patient is aware of the risks of the procedure and elects to proceed with the angiogram and intervention.  DESCRIPTION: After full informed written consent was obtained, the patient was brought back to the Special Procedure suite and placed supine position.  Appropriate cardiopulmonary monitors were placed.  The left arm was prepped and draped in the standard fashion.  Appropriate  timeout is called. The left forearm loop graft was cannulated with a micropuncture needle.  Cannulation was performed with ultrasound guidance. Ultrasound was placed in a sterile sleeve, the AV access was interrogated and noted to be echolucent and compressible indicating patency. Image was recorded for the permanent record. The puncture is performed under continuous ultrasound visualization.   The microwire was advanced and the needle was exchanged for  a microsheath.  The J-wire was then advanced and a 6 Fr sheath inserted.  Hand injections were completed to image the access from the arterial anastomosis through the entire access.  The central venous structures were also imaged by hand injections.  Based on the images, there is an 80% stenosis at the confluence of the subclavian and innominate veins as well as a 70% stenosis in the venous outflow just beyond the previously placed stent in the peripheral segment.  3000 units of heparin was given and a wire was negotiated through the strictures within the venous portion of the graft as well as the central stenosis. The sheath was then upsized to a 7 [place] sheath.  An 10 mm Lutonix drug-eluting balloon was used.  Inflation was to 14 atm for 2 minutes.  There was moderate residual stenosis and therefore an 10 mm x 40 mm [place] balloon was delivered across the subclavian stricture and inflated to 26 atm for 1 minute.  Follow-up imaging demonstrated less than 20% residual stenosis.   The detector  was then repositioned over the peripheral portion of the AV access and 8 mm x 60 mm Lutonix drug eluding balloon was used to treat the stricture within the AV access. Inflation was to 12 atm for 1 minutes.  Moderate spasm was noted although there is significant improvement therefore a total of 1000 mcg of nitroglycerin was delivered through the sheath.  Subsequently a 7 mm ultra score balloon was advanced across the area of spasm and dilated to 8 atm for 30 seconds.   Follow-up imaging demonstrated less than 20% residual stenosis.  Follow-up imaging demonstrates significant improvement with a marked reduction of the strictures.  There is now rapid flow of contrast through the graft and the central veins.   A 4-0 Monocryl purse-string suture was sewn around the sheath.  The sheath was removed and light pressure was applied.  A sterile bandage was applied to the puncture site.    COMPLICATIONS: None  CONDITION: Carlynn Purl, M.D [facility] Office: [phone]  11/10/2017 3:28 PM

## 2017-11-10 NOTE — H&P (Signed)
[facility] Admission History & Physical  MRN : [id number]  Theresa [name] is a 62 y.o. (March 14, 1956) female who presents with chief complaint of No chief complaint on file. Marland Kitchen  History of Present Illness: I am asked to evaluate the patient by the dialysis center. The patient was sent here because they were unable to achieve adequate dialysis this morning. Furthermore the Center states there is very poor thrill and bruit. The patient states there there have been increasing problems with the access, such as "pulling clots" during dialysis and prolonged bleeding after decannulation. The patient estimates these problems have been going on for several weeks. The patient is unaware of any other change.  Patient denies pain or tenderness overlying the access.  There is no pain with dialysis.  The patient denies hand pain or finger pain consistent with steal syndrome.   There have not been any recent interventions or declots of this access.  The patient is not chronically hypotensive on dialysis.  Current Facility-Administered Medications  Medication Dose Route Frequency Provider Last Rate Last Dose  . 0.9 %  sodium chloride infusion   Intravenous Continuous Stegmayer, Kimberly A, PA-C      . ceFAZolin (ANCEF) IVPB 1 g/50 mL premix  1 g Intravenous Once Stegmayer, Kimberly A, PA-C      . famotidine (PEPCID) tablet 40 mg  40 mg Oral PRN Stegmayer, Janalyn Harder, PA-C      . HYDROmorphone (DILAUDID) injection 1 mg  1 mg Intravenous Once PRN Stegmayer, Kimberly A, PA-C      . methylPREDNISolone sodium succinate (SOLU-MEDROL) 125 mg/2 mL injection 125 mg  125 mg Intravenous PRN Stegmayer, Kimberly A, PA-C      . ondansetron (ZOFRAN) injection 4 mg  4 mg Intravenous Q6H PRN Stegmayer, Janalyn Harder, PA-C        Past Medical History:  Diagnosis Date  . Anemia of chronic disease   . Carotid arterial disease ([place])    a. 02/2013 U/S: 40-59% bilat ICA stenosis - *f/u 02/2014*  . Chronic  constipation   . Chronic diastolic CHF (congestive heart failure) ([place])    a. 10/2013 Echo [facility]): EF 55-60%, mod conc LVH, mod MR, mildly dil LA, mild Ao sclerosis w/o stenosis.  . Colon polyps   . COPD (chronic obstructive pulmonary disease) ([place])   . Coronary artery disease    a. 05/2013 NSTEMI/PCI: LM 20d, LAD min irregs, LCX small, nl, OM1 nl, RCA dom 15m (2.5x16 Promus DES), PDA1 80p.  . Diabetes mellitus   . Diabetic neuropathy ([place])   . Diabetic retinopathy ([place]) 05/28/2013   Hx bilat retinal detachment, proliferative diab retinopathy and bilat vitreous hemorrhage   . Emphysema   . ESRD on hemodialysis ([place])    a. [facility] in [place], [place] [phone]/Dr. [name], on a MWF schedule.  She started dialysis in Feb 2014.  Etiology of renal failure not known, likely diabetes.  Has a left upper arm AV graft.  . History of bronchitis    Mar 2012  . History of pneumonia    June 2012  . History of tobacco abuse    a. Quit 2012.  Marland Kitchen Hyperlipidemia   . Hypertension   . Moderate mitral insufficiency    a. 10/2013 Echo: EF 55-60%, mod MR.  . Myocardial infarct ([place]) 05/2013  . Peripheral vascular disease ([place])   . Renal insufficiency   . Sickle cell trait ([place])   . Thyroid nodule    [place] 06/2017 due  to f/u US in 1 year     Past Surgical History:  Procedure Laterality Date  . ABDOMINAL HYSTERECTOMY     2000  . CARDIAC CATHETERIZATION    . CARDIAC CATHETERIZATION N/A 05/02/2015   Procedure: Left Heart Cath and Coronary Angiography;  Surgeon: [name], MD;  Location: [facility];  Service: Cardiovascular;  Laterality: N/A;  . COLONOSCOPY WITH PROPOFOL N/A 07/08/2016   Procedure: COLONOSCOPY WITH PROPOFOL;  Surgeon: [name], MD;  Location: [facility] ENDOSCOPY;  Service: Endoscopy;  Laterality: N/A;  . COLONOSCOPY WITH PROPOFOL N/A 08/20/2017   Procedure: COLONOSCOPY WITH PROPOFOL;  Surgeon: [name], MD;  Location: [facility] ENDOSCOPY;  Service: Gastroenterology;  Laterality:  N/A;  . colonscopy    . CORONARY ANGIOPLASTY  05/28/2014   stent placement to the mid RCA  . DILATION AND CURETTAGE OF UTERUS     several in the early 80's  . ESOPHAGOGASTRODUODENOSCOPY     2012  . EYE SURGERY     bilateral laser 2012  . EYE SURGERY     right  . EYE SURGERY     x4 both eyes  . GAS INSERTION  09/30/2011   Procedure: INSERTION OF GAS;  Surgeon: [name], MD;  Location: [facility];  Service: Ophthalmology;  Laterality: Right;  [id number]  . GAS/FLUID EXCHANGE  09/30/2011   Procedure: GAS/FLUID EXCHANGE;  Surgeon: [name], MD;  Location: [facility];  Service: Ophthalmology;  Laterality: Right;  . LEFT HEART CATHETERIZATION WITH CORONARY ANGIOGRAM N/A 05/28/2013   Procedure: LEFT HEART CATHETERIZATION WITH CORONARY ANGIOGRAM;  Surgeon: [name], MD;  Location: [facility] CATH LAB;  Service: Cardiovascular;  Laterality: N/A;  . PARS PLANA VITRECTOMY  04/22/2011   Procedure: PARS PLANA VITRECTOMY WITH 25 GAUGE;  Surgeon: [name], MD;  Location: [facility];  Service: Ophthalmology;  Laterality: Left;  membrane peel, endolaser, gas fluid exchange, silicone oil, repair of complex traction retinal detachment  . PARS PLANA VITRECTOMY  09/30/2011   Procedure: PARS PLANA VITRECTOMY WITH 25 GAUGE;  Surgeon: [name], MD;  Location: [facility];  Service: Ophthalmology;  Laterality: Right;  Endolaser; Repair of Complex Traction Retinal Detachment  . PARS PLANA VITRECTOMY  02/24/2012   Procedure: PARS PLANA VITRECTOMY WITH 25 GAUGE;  Surgeon: [name], MD;  Location: [facility];  Service: Ophthalmology;  Laterality: Left;  . PTCA    . SILICON OIL REMOVAL  3/81/0175   Procedure: SILICON OIL REMOVAL;  Surgeon: [name], MD;  Location: [facility];  Service: Ophthalmology;  Laterality: Left;  . THROMBECTOMY / ARTERIOVENOUS GRAFT REVISION    . TUBAL LIGATION     1979    Social History Social History   Tobacco Use  . Smoking status: Former Smoker    Packs/day: 1.00    Years:  25.00    Pack years: 25.00    Last attempt to quit: 06/09/2010    Years since quitting: 7.4  . Smokeless tobacco: Never Used  Substance Use Topics  . Alcohol use: No  . Drug use: No    Family History Family History  Problem Relation Age of Onset  . Stroke Mother   . Heart attack Mother   . Heart disease Mother   . Colon cancer Father   . Colon cancer Sister   . Heart attack Brother   . Stroke Brother   . Diabetes Brother   . Breast cancer Sister   . Diabetes Brother   .  Diabetes Daughter   . Anesthesia problems Neg Hx   . Hypotension Neg Hx   . Malignant hyperthermia Neg Hx   . Pseudochol deficiency Neg Hx     No family history of bleeding or clotting disorders, autoimmune disease or porphyria  Allergies  Allergen Reactions  . Hydrocodone Hives  . Metformin Diarrhea and Other (See Comments)    Other reaction(s): Distress (finding)  . Lisinopril     Unknown reaction      REVIEW OF SYSTEMS (Negative unless checked)  Constitutional: [] Weight loss  [] Fever  [] Chills Cardiac: [] Chest pain   [] Chest pressure   [] Palpitations   [] Shortness of breath when laying flat   [] Shortness of breath at rest   [x] Shortness of breath with exertion. Vascular:  [] Pain in legs with walking   [] Pain in legs at rest   [] Pain in legs when laying flat   [] Claudication   [] Pain in feet when walking  [] Pain in feet at rest  [] Pain in feet when laying flat   [] History of DVT   [] Phlebitis   [] Swelling in legs   [] Varicose veins   [] Non-healing ulcers Pulmonary:   [] Uses home oxygen   [] Productive cough   [] Hemoptysis   [] Wheeze  [] COPD   [] Asthma Neurologic:  [] Dizziness  [] Blackouts   [] Seizures   [] History of stroke   [] History of TIA  [] Aphasia   [] Temporary blindness   [] Dysphagia   [] Weakness or numbness in arms   [] Weakness or numbness in legs Musculoskeletal:  [] Arthritis   [] Joint swelling   [] Joint pain   [] Low back pain Hematologic:  [] Easy bruising  [] Easy bleeding   [] Hypercoagulable  state   [] Anemic  [] Hepatitis Gastrointestinal:  [] Blood in stool   [] Vomiting blood  [] Gastroesophageal reflux/heartburn   [] Difficulty swallowing. Genitourinary:  [x] Chronic kidney disease   [] Difficult urination  [] Frequent urination  [] Burning with urination   [] Blood in urine Skin:  [] Rashes   [] Ulcers   [] Wounds Psychological:  [] History of anxiety   []  History of major depression.  Physical Examination  Vitals:   11/10/17 1256  BP: (!) 171/66  Pulse: 71  Resp: 18  Temp: 98.3 F (36.8 C)  SpO2: 100%  Weight: 164 lb (74.4 kg)  Height: 5\' 7"  (1.702 m)   Body mass index is 25.69 kg/m. Gen: WD/WN, NAD Head: [place]/AT, No temporalis wasting. Prominent temp pulse not noted. Ear/Nose/Throat: Hearing grossly intact, nares w/o erythema or drainage, oropharynx w/o Erythema/Exudate,  Eyes: Conjunctiva clear, sclera non-icteric Neck: Trachea midline.  No JVD.  Pulmonary:  Good air movement, respirations not labored, no use of accessory muscles.  Cardiac: RRR, normal S1, S2. Vascular: Left forearm loop graft moderate pulsatility palpable thrill Vessel Right Left  Radial Palpable Palpable  Ulnar Not Palpable Not Palpable  Brachial Palpable Palpable  Carotid Palpable, without bruit Palpable, without bruit  Gastrointestinal: soft, non-tender/non-distended. No guarding/reflex.  Musculoskeletal: M/S 5/5 throughout.  Extremities without ischemic changes.  No deformity or atrophy.  Neurologic: Sensation grossly intact in extremities.  Symmetrical.  Speech is fluent. Motor exam as listed above. Psychiatric: Judgment intact, Mood & affect appropriate for pt's clinical situation. Dermatologic: No rashes or ulcers noted.  No cellulitis or open wounds. Lymph : No Cervical, Axillary, or Inguinal lymphadenopathy.   CBC Lab Results  Component Value Date   WBC 8.3 09/26/2017   HGB 13.9 09/26/2017   HCT 41.0 09/26/2017   MCV 103.0 (H) 09/26/2017   PLT 175 09/26/2017    BMET    Component  Value Date/Time   NA 138 09/26/2017 2132   NA 143 07/27/2015 1737   NA 137 05/14/2014 0741   K 4.1 09/26/2017 2132   K 3.3 (L) 05/14/2014 1500   CL 97 (L) 09/26/2017 2132   CL 98 05/14/2014 0741   CO2 29 09/26/2017 2132   CO2 35 (H) 05/14/2014 0741   GLUCOSE 137 (H) 09/26/2017 2132   GLUCOSE 96 05/14/2014 0741   BUN 46 (H) 09/26/2017 2132   BUN 25 (H) 05/14/2014 0741   CREATININE 9.01 (H) 09/26/2017 2132   CREATININE 5.83 (H) 05/14/2014 0741   CALCIUM 8.7 (L) 09/26/2017 2132   CALCIUM 8.9 05/14/2014 0741   GFRNONAA 4 (L) 09/26/2017 2132   GFRNONAA 8 (L) 05/14/2014 0741   GFRNONAA 4 (L) 02/15/2014 0837   GFRAA 5 (L) 09/26/2017 2132   GFRAA 10 (L) 05/14/2014 0741   GFRAA 5 (L) 02/15/2014 0837   CrCl cannot be calculated (Patient's most recent lab result is older than the maximum 21 days allowed.).  COAG Lab Results  Component Value Date   INR 1.00 03/11/2017   INR 1.17 05/02/2015   INR 1.0 10/10/2013    Radiology No results found.  Assessment/Plan 1.  Complication dialysis device with thrombosis AV access:  Patient's left forearm loop dialysis access is malfunctioning. The patient will undergo angiography and correction of any problems using interventional techniques with the hope of restoring function to the access.  The risks and benefits were described to the patient.  All questions were answered.  The patient agrees to proceed with angiography and intervention. Potassium will be drawn to ensure that it is an appropriate level prior to performing intervention. 2.  End-stage renal disease requiring hemodialysis:  Patient will continue dialysis therapy without further interruption if a successful intervention is not achieved then a tunneled catheter will be placed. Dialysis has already been arranged. 3.  Hypertension:  Patient will continue medical management; nephrology is following no changes in oral medications. 4. Diabetes mellitus:  Glucose will be monitored and oral  medications been held this morning once the patient has undergone the patient's procedure po intake will be reinitiated and again Accu-Cheks will be used to assess the blood glucose level and treat as needed. The patient will be restarted on the patient's usual hypoglycemic regime 5.  Coronary artery disease:  EKG will be monitored. Nitrates will be used if needed. The patient's oral cardiac medications will be continued.    Hortencia Pilar, MD  11/10/2017 2:23 PM

## 2017-11-11 ENCOUNTER — Encounter: Payer: Self-pay | Admitting: Vascular Surgery

## 2017-11-11 DIAGNOSIS — N2581 Secondary hyperparathyroidism of renal origin: Secondary | ICD-10-CM | POA: Diagnosis not present

## 2017-11-11 DIAGNOSIS — D631 Anemia in chronic kidney disease: Secondary | ICD-10-CM | POA: Diagnosis not present

## 2017-11-11 DIAGNOSIS — N186 End stage renal disease: Secondary | ICD-10-CM | POA: Diagnosis not present

## 2017-11-11 DIAGNOSIS — D509 Iron deficiency anemia, unspecified: Secondary | ICD-10-CM | POA: Diagnosis not present

## 2017-11-11 DIAGNOSIS — Z992 Dependence on renal dialysis: Secondary | ICD-10-CM | POA: Diagnosis not present

## 2017-11-12 ENCOUNTER — Ambulatory Visit: Payer: Medicare Other | Admitting: Internal Medicine

## 2017-11-12 DIAGNOSIS — Z0289 Encounter for other administrative examinations: Secondary | ICD-10-CM

## 2017-11-13 DIAGNOSIS — Z992 Dependence on renal dialysis: Secondary | ICD-10-CM | POA: Diagnosis not present

## 2017-11-13 DIAGNOSIS — D631 Anemia in chronic kidney disease: Secondary | ICD-10-CM | POA: Diagnosis not present

## 2017-11-13 DIAGNOSIS — N186 End stage renal disease: Secondary | ICD-10-CM | POA: Diagnosis not present

## 2017-11-13 DIAGNOSIS — D509 Iron deficiency anemia, unspecified: Secondary | ICD-10-CM | POA: Diagnosis not present

## 2017-11-13 DIAGNOSIS — N2581 Secondary hyperparathyroidism of renal origin: Secondary | ICD-10-CM | POA: Diagnosis not present

## 2017-11-16 ENCOUNTER — Encounter: Admit: 2017-11-16 | Discharge: 2017-11-16 | Payer: MEDICARE | Attending: Nephrology | Primary: Nephrology

## 2017-11-16 DIAGNOSIS — D631 Anemia in chronic kidney disease: Secondary | ICD-10-CM | POA: Diagnosis not present

## 2017-11-16 DIAGNOSIS — N186 End stage renal disease: Secondary | ICD-10-CM | POA: Diagnosis not present

## 2017-11-16 DIAGNOSIS — D509 Iron deficiency anemia, unspecified: Secondary | ICD-10-CM | POA: Diagnosis not present

## 2017-11-16 DIAGNOSIS — Z992 Dependence on renal dialysis: Secondary | ICD-10-CM | POA: Diagnosis not present

## 2017-11-16 DIAGNOSIS — N2581 Secondary hyperparathyroidism of renal origin: Secondary | ICD-10-CM | POA: Diagnosis not present

## 2017-11-17 DIAGNOSIS — E11319 Type 2 diabetes mellitus with unspecified diabetic retinopathy without macular edema: Secondary | ICD-10-CM | POA: Diagnosis not present

## 2017-11-17 DIAGNOSIS — H43313 Vitreous membranes and strands, bilateral: Secondary | ICD-10-CM | POA: Diagnosis not present

## 2017-11-18 DIAGNOSIS — N2581 Secondary hyperparathyroidism of renal origin: Secondary | ICD-10-CM | POA: Diagnosis not present

## 2017-11-18 DIAGNOSIS — D509 Iron deficiency anemia, unspecified: Secondary | ICD-10-CM | POA: Diagnosis not present

## 2017-11-18 DIAGNOSIS — D631 Anemia in chronic kidney disease: Secondary | ICD-10-CM | POA: Diagnosis not present

## 2017-11-18 DIAGNOSIS — Z992 Dependence on renal dialysis: Secondary | ICD-10-CM | POA: Diagnosis not present

## 2017-11-18 DIAGNOSIS — N186 End stage renal disease: Secondary | ICD-10-CM | POA: Diagnosis not present

## 2017-11-20 DIAGNOSIS — Z7682 Awaiting organ transplant status: Principal | ICD-10-CM

## 2017-11-20 DIAGNOSIS — N186 End stage renal disease: Secondary | ICD-10-CM

## 2017-11-20 DIAGNOSIS — Z01818 Encounter for other preprocedural examination: Secondary | ICD-10-CM

## 2017-11-20 DIAGNOSIS — Z992 Dependence on renal dialysis: Secondary | ICD-10-CM | POA: Diagnosis not present

## 2017-11-20 DIAGNOSIS — D509 Iron deficiency anemia, unspecified: Secondary | ICD-10-CM | POA: Diagnosis not present

## 2017-11-20 DIAGNOSIS — D631 Anemia in chronic kidney disease: Secondary | ICD-10-CM | POA: Diagnosis not present

## 2017-11-20 DIAGNOSIS — N2581 Secondary hyperparathyroidism of renal origin: Secondary | ICD-10-CM | POA: Diagnosis not present

## 2017-11-21 DIAGNOSIS — N186 End stage renal disease: Secondary | ICD-10-CM | POA: Diagnosis not present

## 2017-11-21 DIAGNOSIS — D509 Iron deficiency anemia, unspecified: Secondary | ICD-10-CM | POA: Diagnosis not present

## 2017-11-21 DIAGNOSIS — N2581 Secondary hyperparathyroidism of renal origin: Secondary | ICD-10-CM | POA: Diagnosis not present

## 2017-11-21 DIAGNOSIS — D631 Anemia in chronic kidney disease: Secondary | ICD-10-CM | POA: Diagnosis not present

## 2017-11-21 DIAGNOSIS — Z992 Dependence on renal dialysis: Secondary | ICD-10-CM | POA: Diagnosis not present

## 2017-11-23 DIAGNOSIS — D631 Anemia in chronic kidney disease: Secondary | ICD-10-CM | POA: Diagnosis not present

## 2017-11-23 DIAGNOSIS — D509 Iron deficiency anemia, unspecified: Secondary | ICD-10-CM | POA: Diagnosis not present

## 2017-11-23 DIAGNOSIS — Z992 Dependence on renal dialysis: Secondary | ICD-10-CM | POA: Diagnosis not present

## 2017-11-23 DIAGNOSIS — N2581 Secondary hyperparathyroidism of renal origin: Secondary | ICD-10-CM | POA: Diagnosis not present

## 2017-11-23 DIAGNOSIS — N186 End stage renal disease: Secondary | ICD-10-CM | POA: Diagnosis not present

## 2017-11-25 DIAGNOSIS — N186 End stage renal disease: Secondary | ICD-10-CM | POA: Diagnosis not present

## 2017-11-25 DIAGNOSIS — N2581 Secondary hyperparathyroidism of renal origin: Secondary | ICD-10-CM | POA: Diagnosis not present

## 2017-11-25 DIAGNOSIS — Z992 Dependence on renal dialysis: Secondary | ICD-10-CM | POA: Diagnosis not present

## 2017-11-25 DIAGNOSIS — D631 Anemia in chronic kidney disease: Secondary | ICD-10-CM | POA: Diagnosis not present

## 2017-11-25 DIAGNOSIS — D509 Iron deficiency anemia, unspecified: Secondary | ICD-10-CM | POA: Diagnosis not present

## 2017-11-26 DIAGNOSIS — Z992 Dependence on renal dialysis: Secondary | ICD-10-CM | POA: Diagnosis not present

## 2017-11-26 DIAGNOSIS — N186 End stage renal disease: Secondary | ICD-10-CM | POA: Diagnosis not present

## 2017-11-26 DIAGNOSIS — D509 Iron deficiency anemia, unspecified: Secondary | ICD-10-CM | POA: Diagnosis not present

## 2017-11-26 DIAGNOSIS — D631 Anemia in chronic kidney disease: Secondary | ICD-10-CM | POA: Diagnosis not present

## 2017-11-26 DIAGNOSIS — N2581 Secondary hyperparathyroidism of renal origin: Secondary | ICD-10-CM | POA: Diagnosis not present

## 2017-11-27 DIAGNOSIS — D509 Iron deficiency anemia, unspecified: Secondary | ICD-10-CM | POA: Diagnosis not present

## 2017-11-27 DIAGNOSIS — D631 Anemia in chronic kidney disease: Secondary | ICD-10-CM | POA: Diagnosis not present

## 2017-11-27 DIAGNOSIS — N2581 Secondary hyperparathyroidism of renal origin: Secondary | ICD-10-CM | POA: Diagnosis not present

## 2017-11-27 DIAGNOSIS — Z992 Dependence on renal dialysis: Secondary | ICD-10-CM | POA: Diagnosis not present

## 2017-11-27 DIAGNOSIS — N186 End stage renal disease: Secondary | ICD-10-CM | POA: Diagnosis not present

## 2017-11-30 DIAGNOSIS — D631 Anemia in chronic kidney disease: Secondary | ICD-10-CM | POA: Diagnosis not present

## 2017-11-30 DIAGNOSIS — N186 End stage renal disease: Secondary | ICD-10-CM | POA: Diagnosis not present

## 2017-11-30 DIAGNOSIS — D509 Iron deficiency anemia, unspecified: Secondary | ICD-10-CM | POA: Diagnosis not present

## 2017-11-30 DIAGNOSIS — N2581 Secondary hyperparathyroidism of renal origin: Secondary | ICD-10-CM | POA: Diagnosis not present

## 2017-11-30 DIAGNOSIS — Z992 Dependence on renal dialysis: Secondary | ICD-10-CM | POA: Diagnosis not present

## 2017-12-02 DIAGNOSIS — D631 Anemia in chronic kidney disease: Secondary | ICD-10-CM | POA: Diagnosis not present

## 2017-12-02 DIAGNOSIS — Z992 Dependence on renal dialysis: Secondary | ICD-10-CM | POA: Diagnosis not present

## 2017-12-02 DIAGNOSIS — D509 Iron deficiency anemia, unspecified: Secondary | ICD-10-CM | POA: Diagnosis not present

## 2017-12-02 DIAGNOSIS — N186 End stage renal disease: Secondary | ICD-10-CM | POA: Diagnosis not present

## 2017-12-02 DIAGNOSIS — N2581 Secondary hyperparathyroidism of renal origin: Secondary | ICD-10-CM | POA: Diagnosis not present

## 2017-12-03 ENCOUNTER — Other Ambulatory Visit: Payer: Self-pay | Admitting: Physical Medicine and Rehabilitation

## 2017-12-03 DIAGNOSIS — M5416 Radiculopathy, lumbar region: Secondary | ICD-10-CM | POA: Diagnosis not present

## 2017-12-03 DIAGNOSIS — M5417 Radiculopathy, lumbosacral region: Secondary | ICD-10-CM

## 2017-12-03 DIAGNOSIS — M5136 Other intervertebral disc degeneration, lumbar region: Secondary | ICD-10-CM | POA: Diagnosis not present

## 2017-12-04 DIAGNOSIS — Z992 Dependence on renal dialysis: Secondary | ICD-10-CM | POA: Diagnosis not present

## 2017-12-04 DIAGNOSIS — N186 End stage renal disease: Secondary | ICD-10-CM | POA: Diagnosis not present

## 2017-12-04 DIAGNOSIS — D631 Anemia in chronic kidney disease: Secondary | ICD-10-CM | POA: Diagnosis not present

## 2017-12-04 DIAGNOSIS — N2581 Secondary hyperparathyroidism of renal origin: Secondary | ICD-10-CM | POA: Diagnosis not present

## 2017-12-04 DIAGNOSIS — D509 Iron deficiency anemia, unspecified: Secondary | ICD-10-CM | POA: Diagnosis not present

## 2017-12-06 DIAGNOSIS — Z992 Dependence on renal dialysis: Secondary | ICD-10-CM | POA: Diagnosis not present

## 2017-12-06 DIAGNOSIS — N186 End stage renal disease: Secondary | ICD-10-CM | POA: Diagnosis not present

## 2017-12-07 ENCOUNTER — Encounter (INDEPENDENT_AMBULATORY_CARE_PROVIDER_SITE_OTHER): Payer: Self-pay | Admitting: Vascular Surgery

## 2017-12-07 ENCOUNTER — Ambulatory Visit (INDEPENDENT_AMBULATORY_CARE_PROVIDER_SITE_OTHER): Payer: Medicare Other | Admitting: Vascular Surgery

## 2017-12-07 VITALS — BP 195/75 | HR 68 | Resp 16 | Ht 67.0 in | Wt 170.0 lb

## 2017-12-07 DIAGNOSIS — T829XXS Unspecified complication of cardiac and vascular prosthetic device, implant and graft, sequela: Secondary | ICD-10-CM | POA: Diagnosis not present

## 2017-12-07 DIAGNOSIS — I6523 Occlusion and stenosis of bilateral carotid arteries: Secondary | ICD-10-CM

## 2017-12-07 DIAGNOSIS — N2581 Secondary hyperparathyroidism of renal origin: Secondary | ICD-10-CM | POA: Diagnosis not present

## 2017-12-07 DIAGNOSIS — Z992 Dependence on renal dialysis: Secondary | ICD-10-CM | POA: Diagnosis not present

## 2017-12-07 DIAGNOSIS — I10 Essential (primary) hypertension: Secondary | ICD-10-CM | POA: Diagnosis not present

## 2017-12-07 DIAGNOSIS — I25118 Atherosclerotic heart disease of native coronary artery with other forms of angina pectoris: Secondary | ICD-10-CM

## 2017-12-07 DIAGNOSIS — N186 End stage renal disease: Secondary | ICD-10-CM

## 2017-12-07 DIAGNOSIS — I739 Peripheral vascular disease, unspecified: Secondary | ICD-10-CM | POA: Diagnosis not present

## 2017-12-07 DIAGNOSIS — D509 Iron deficiency anemia, unspecified: Secondary | ICD-10-CM | POA: Diagnosis not present

## 2017-12-07 DIAGNOSIS — D631 Anemia in chronic kidney disease: Secondary | ICD-10-CM | POA: Diagnosis not present

## 2017-12-07 NOTE — Progress Notes (Signed)
MRN : [id number]  Theresa [name] is a 62 y.o. (06-01-1956) female who presents with chief complaint of  Chief Complaint  Patient presents with  . Follow-up    left arm pain with bruising  .  History of Present Illness:   The patient returns to the office for followup status post intervention of the dialysis access.   PROCEDURE November 10, 2017: 1. Contrast injection left forearm loop AV access 2. Percutaneous transluminal angioplasty peripheral segment to 8 mm with a Lutonix drug-eluting balloon 3. Percutaneous transluminal angioplasty central venous segment to 10 mm with a Lutonix drug-eluting balloon  Following the intervention the access function has significantly improved, with better flow rates and improved KT/V. The patient has not been experiencing increased bleeding times following decannulation and the patient denies increased recirculation. The patient denies an increase in arm swelling. At the present time the patient denies hand pain.  The patient denies amaurosis fugax or recent TIA symptoms. There are no recent neurological changes noted. The patient denies claudication symptoms or rest pain symptoms. The patient denies history of DVT, PE or superficial thrombophlebitis. The patient denies recent episodes of angina or shortness of breath.      Current Meds  Medication Sig  . acetaminophen (TYLENOL) 650 MG CR tablet Take 1,300 mg by mouth every 4 (four) hours as needed for pain.  Marland Kitchen albuterol (PROVENTIL HFA;VENTOLIN HFA) 108 (90 Base) MCG/ACT inhaler Inhale 2 puffs into the lungs every 6 (six) hours as needed for wheezing or shortness of breath.  Marland Kitchen albuterol (PROVENTIL) (2.5 MG/3ML) 0.083% nebulizer solution Take 2.5 mg by nebulization every 4 (four) hours as needed for wheezing or shortness of breath.  Marland Kitchen amLODipine (NORVASC) 10 MG tablet Take 10 mg by mouth daily.   Marland Kitchen aspirin EC 81 MG tablet Take 81 mg by mouth at bedtime.   Marland Kitchen atorvastatin (LIPITOR) 40 MG tablet Take 1  tablet (40 mg total) by mouth daily.  Lorin Picket 1 GM 210 MG(Fe) tablet Take 420-630 mg by mouth See admin instructions. Take 630 mg with each meal and 420 mg with each snack  . carvedilol (COREG) 12.5 MG tablet IF BLOOD PRESSURE GREATER THAN 160, TAKE 2 TABLETS BY MOUTH TWICE DAILY, OTHERWISE TAKE 1 TABLET BY MOUTH TWICE DAILY.  . cinacalcet (SENSIPAR) 60 MG tablet Take 60 mg by mouth daily.  . cloNIDine (CATAPRES) 0.1 MG tablet Take 0.1 mg by mouth 2 (two) times daily.  . diphenhydrAMINE (BENADRYL) 25 MG tablet Take 25 mg by mouth daily as needed for allergies.  . furosemide (LASIX) 80 MG tablet Take 80 mg by mouth See admin instructions. Take 80 mg by mouth once daily on Sun, Tue, Thur, and Sat (non dialysis days)  . isosorbide mononitrate (IMDUR) 60 MG 24 hr tablet Take 1 tablet (60 mg total) by mouth daily.  Marland Kitchen losartan (COZAAR) 100 MG tablet Take 1 tablet (100 mg total) by mouth daily.  . Melatonin 5 MG TABS Take 5 mg by mouth at bedtime.  . multivitamin (RENA-VIT) TABS tablet TAKE ONE TABLET BY MOUTH ONCE DAILY  . nitroGLYCERIN (NITROSTAT) 0.4 MG SL tablet Place 1 tablet (0.4 mg total) under the tongue every 5 (five) minutes as needed for chest pain.  Marland Kitchen oxyCODONE-acetaminophen (PERCOCET/ROXICET) 5-325 MG tablet Take 1 tablet by mouth 2 (two) times daily as needed for pain.  . pantoprazole (PROTONIX) 40 MG tablet Take 1 tablet (40 mg total) by mouth daily.  . pregabalin (LYRICA) 75 MG capsule Take  1 capsule (75 mg total) by mouth daily.   Current Facility-Administered Medications for the 12/07/17 encounter (Office Visit) with Delana Meyer, Dolores Lory, MD  Medication  . betamethasone acetate-betamethasone sodium phosphate (CELESTONE) injection 3 mg    Past Medical History:  Diagnosis Date  . Anemia of chronic disease   . Carotid arterial disease ([place])    a. 02/2013 U/S: 40-59% bilat ICA stenosis - *f/u 02/2014*  . Chronic constipation   . Chronic diastolic CHF (congestive heart failure) ([place])     a. 10/2013 Echo [facility]): EF 55-60%, mod conc LVH, mod MR, mildly dil LA, mild Ao sclerosis w/o stenosis.  . Colon polyps   . COPD (chronic obstructive pulmonary disease) ([place])   . Coronary artery disease    a. 05/2013 NSTEMI/PCI: LM 20d, LAD min irregs, LCX small, nl, OM1 nl, RCA dom 27m (2.5x16 Promus DES), PDA1 80p.  . Diabetes mellitus   . Diabetic neuropathy ([place])   . Diabetic retinopathy ([place]) 05/28/2013   Hx bilat retinal detachment, proliferative diab retinopathy and bilat vitreous hemorrhage   . Emphysema   . ESRD on hemodialysis ([place])    a. [facility] in [place], [place] [phone]/Dr. [name], on a MWF schedule.  She started dialysis in Feb 2014.  Etiology of renal failure not known, likely diabetes.  Has a left upper arm AV graft.  . History of bronchitis    Mar 2012  . History of pneumonia    June 2012  . History of tobacco abuse    a. Quit 2012.  Marland Kitchen Hyperlipidemia   . Hypertension   . Moderate mitral insufficiency    a. 10/2013 Echo: EF 55-60%, mod MR.  . Myocardial infarct ([place]) 05/2013  . Peripheral vascular disease ([place])   . Renal insufficiency   . Sickle cell trait ([place])   . Thyroid nodule    [place] 06/2017 due to f/u US in 1 year     Past Surgical History:  Procedure Laterality Date  . A/V FISTULAGRAM Left 11/10/2017   Procedure: A/V FISTULAGRAM;  Surgeon: Katha Cabal, MD;  Location: [place] [facility];  Service: Cardiovascular;  Laterality: Left;  . ABDOMINAL HYSTERECTOMY     2000  . CARDIAC CATHETERIZATION    . CARDIAC CATHETERIZATION N/A 05/02/2015   Procedure: Left Heart Cath and Coronary Angiography;  Surgeon: [name], MD;  Location: [facility];  Service: Cardiovascular;  Laterality: N/A;  . COLONOSCOPY WITH PROPOFOL N/A 07/08/2016   Procedure: COLONOSCOPY WITH PROPOFOL;  Surgeon: [name], MD;  Location: [facility] ENDOSCOPY;  Service: Endoscopy;  Laterality: N/A;  . COLONOSCOPY WITH PROPOFOL N/A 08/20/2017   Procedure: COLONOSCOPY WITH  PROPOFOL;  Surgeon: [name], MD;  Location[facility] ENDOSCOPY;  Service: Gastroenterology;  Laterality: N/A;  . colonscopy    . CORONARY ANGIOPLASTY  05/28/2014   stent placement to the mid RCA  . DILATION AND CURETTAGE OF UTERUS     several in the early 80's  . ESOPHAGOGASTRODUODENOSCOPY     2012  . EYE SURGERY     bilateral laser 2012  . EYE SURGERY     right  . EYE SURGERY     x4 both eyes  . GAS INSERTION  09/30/2011   Procedure: INSERTION OF GAS;  Surgeon: [name], MD;  Location: [place];  Service: Ophthalmology;  Laterality: Right;  [id number]  . GAS/FLUID EXCHANGE  09/30/2011   Procedure: GAS/FLUID EXCHANGE;  Surgeon: [name], MD;  Location: [place];  Service: Ophthalmology;  Laterality: Right;  . LEFT HEART CATHETERIZATION WITH CORONARY ANGIOGRAM N/A 05/28/2013   Procedure: LEFT HEART CATHETERIZATION WITH CORONARY ANGIOGRAM;  Surgeon: [name], MD;  Location: [facility] CATH LAB;  Service: Cardiovascular;  Laterality: N/A;  . PARS PLANA VITRECTOMY  04/22/2011   Procedure: PARS PLANA VITRECTOMY WITH 25 GAUGE;  Surgeon: [name], MD;  Location: [place];  Service: Ophthalmology;  Laterality: Left;  membrane peel, endolaser, gas fluid exchange, silicone oil, repair of complex traction retinal detachment  . PARS PLANA VITRECTOMY  09/30/2011   Procedure: PARS PLANA VITRECTOMY WITH 25 GAUGE;  Surgeon: [name], MD;  Location: [place];  Service: Ophthalmology;  Laterality: Right;  Endolaser; Repair of Complex Traction Retinal Detachment  . PARS PLANA VITRECTOMY  02/24/2012   Procedure: PARS PLANA VITRECTOMY WITH 25 GAUGE;  Surgeon: [name], MD;  Location: [place];  Service: Ophthalmology;  Laterality: Left;  . PTCA    . SILICON OIL REMOVAL  1/51/7616   Procedure: SILICON OIL REMOVAL;  Surgeon: [name], MD;  Location: [place];  Service: Ophthalmology;  Laterality: Left;  . THROMBECTOMY / ARTERIOVENOUS GRAFT REVISION    . TUBAL LIGATION     1979    Social  History Social History   Tobacco Use  . Smoking status: Former Smoker    Packs/day: 1.00    Years: 25.00    Pack years: 25.00    Last attempt to quit: 06/09/2010    Years since quitting: 7.5  . Smokeless tobacco: Never Used  Substance Use Topics  . Alcohol use: No  . Drug use: No    Family History Family History  Problem Relation Age of Onset  . Stroke Mother   . Heart attack Mother   . Heart disease Mother   . Colon cancer Father   . Colon cancer Sister   . Heart attack Brother   . Stroke Brother   . Diabetes Brother   . Breast cancer Sister   . Diabetes Brother   . Diabetes Daughter   . Anesthesia problems Neg Hx   . Hypotension Neg Hx   . Malignant hyperthermia Neg Hx   . Pseudochol deficiency Neg Hx     Allergies  Allergen Reactions  . Hydrocodone Hives  . Metformin Diarrhea and Other (See Comments)    Other reaction(s): Distress (finding)  . Lisinopril     Unknown reaction      REVIEW OF SYSTEMS (Negative unless checked)  Constitutional: [] Weight loss  [] Fever  [] Chills Cardiac: [] Chest pain   [] Chest pressure   [] Palpitations   [] Shortness of breath when laying flat   [] Shortness of breath with exertion. Vascular:  [] Pain in legs with walking   [] Pain in legs at rest  [] History of DVT   [] Phlebitis   [] Swelling in legs   [] Varicose veins   [] Non-healing ulcers Pulmonary:   [] Uses home oxygen   [] Productive cough   [] Hemoptysis   [] Wheeze  [] COPD   [] Asthma Neurologic:  [] Dizziness   [] Seizures   [] History of stroke   [] History of TIA  [] Aphasia   [] Vissual changes   [] Weakness or numbness in arm   [] Weakness or numbness in leg Musculoskeletal:   [] Joint swelling   [] Joint pain   [] Low back pain Hematologic:  [] Easy bruising  [] Easy bleeding   [] Hypercoagulable state   [] Anemic Gastrointestinal:  [] Diarrhea   [] Vomiting  [] Gastroesophageal reflux/heartburn   [] Difficulty swallowing. Genitourinary:  [] Chronic kidney disease   [] Difficult urination  []   Frequent  urination   [] Blood in urine Skin:  [] Rashes   [] Ulcers  Psychological:  [] History of anxiety   []  History of major depression.  Physical Examination  Vitals:   12/07/17 1501  BP: (!) 195/75  Pulse: 68  Resp: 16  Weight: 170 lb (77.1 kg)  Height: 5\' 7"  (1.702 m)   Body mass index is 26.63 kg/m. Gen: WD/WN, NAD Head: [place]/AT, No temporalis wasting.  Ear/Nose/Throat: Hearing grossly intact, nares w/o erythema or drainage Eyes: PER, EOMI, sclera nonicteric.  Neck: Supple, no large masses.   Pulmonary:  Good air movement, no audible wheezing bilaterally, no use of accessory muscles.  Cardiac: RRR, no JVD Vascular:  Left forearm loop graft good thrill good bruit Vessel Right Left  Radial Palpable Palpable  Ulnar Palpable Palpable  Brachial Palpable Palpable  Gastrointestinal: Non-distended. No guarding/no peritoneal signs.  Musculoskeletal: M/S 5/5 throughout.  No deformity or atrophy.  Neurologic: CN 2-12 intact. Symmetrical.  Speech is fluent. Motor exam as listed above. Psychiatric: Judgment intact, Mood & affect appropriate for pt's clinical situation. Dermatologic: No rashes or ulcers noted.  No changes consistent with cellulitis. Lymph : No lichenification or skin changes of chronic lymphedema.  CBC Lab Results  Component Value Date   WBC 8.3 09/26/2017   HGB 13.9 09/26/2017   HCT 41.0 09/26/2017   MCV 103.0 (H) 09/26/2017   PLT 175 09/26/2017    BMET    Component Value Date/Time   NA 138 09/26/2017 2132   NA 143 07/27/2015 1737   NA 137 05/14/2014 0741   K 4.1 09/26/2017 2132   K 3.3 (L) 05/14/2014 1500   CL 97 (L) 09/26/2017 2132   CL 98 05/14/2014 0741   CO2 29 09/26/2017 2132   CO2 35 (H) 05/14/2014 0741   GLUCOSE 137 (H) 09/26/2017 2132   GLUCOSE 96 05/14/2014 0741   BUN 46 (H) 09/26/2017 2132   BUN 25 (H) 05/14/2014 0741   CREATININE 9.01 (H) 09/26/2017 2132   CREATININE 5.83 (H) 05/14/2014 0741   CALCIUM 8.7 (L) 09/26/2017 2132   CALCIUM 8.9  05/14/2014 0741   GFRNONAA 4 (L) 09/26/2017 2132   GFRNONAA 8 (L) 05/14/2014 0741   GFRNONAA 4 (L) 02/15/2014 0837   GFRAA 5 (L) 09/26/2017 2132   GFRAA 10 (L) 05/14/2014 0741   GFRAA 5 (L) 02/15/2014 0837   CrCl cannot be calculated (Patient's most recent lab result is older than the maximum 21 days allowed.).  COAG Lab Results  Component Value Date   INR 1.00 03/11/2017   INR 1.17 05/02/2015   INR 1.0 10/10/2013    Radiology No results found.    Assessment/Plan 1. Complication of vascular access for dialysis, sequela Recommend:  The patient is doing well and currently has adequate dialysis access. The patient's dialysis center is not reporting any access issues. Flow pattern is stable when compared to the prior ultrasound.  The patient should have a duplex ultrasound of the dialysis access in 3 months. The patient will follow-up with me in the office after each ultrasound     2. ESRD on hemodialysis ([place]) Continue dialysis without interuption  3. PAD (peripheral artery disease) (H[facility])  Recommend:  The patient has evidence of atherosclerosis of the lower extremities with claudication.  The patient does not voice lifestyle limiting changes at this point in time.  Noninvasive studies do not suggest clinically significant change.  No invasive studies, angiography or surgery at this time The patient should continue walking and begin  a more formal exercise program.  The patient should continue antiplatelet therapy and aggressive treatment of the lipid abnormalities  No changes in the patient's medications at this time  The patient should continue wearing graduated compression socks 10-15 mmHg strength to control the mild edema.    4. Essential (primary) hypertension Continue antihypertensive medications as already ordered, these medications have been reviewed and there are no changes at this time.   5. Coronary artery disease of native artery of native heart with  stable angina pectoris ([place]) Continue cardiac and antihypertensive medications as already ordered and reviewed, no changes at this time.  Continue statin as ordered and reviewed, no changes at this time  Nitrates PRN for chest pain    Hortencia Pilar, MD  12/07/2017 3:12 PM

## 2017-12-09 ENCOUNTER — Encounter (INDEPENDENT_AMBULATORY_CARE_PROVIDER_SITE_OTHER): Payer: Self-pay | Admitting: Vascular Surgery

## 2017-12-09 DIAGNOSIS — D631 Anemia in chronic kidney disease: Secondary | ICD-10-CM | POA: Diagnosis not present

## 2017-12-09 DIAGNOSIS — D509 Iron deficiency anemia, unspecified: Secondary | ICD-10-CM | POA: Diagnosis not present

## 2017-12-09 DIAGNOSIS — N186 End stage renal disease: Secondary | ICD-10-CM | POA: Diagnosis not present

## 2017-12-09 DIAGNOSIS — Z992 Dependence on renal dialysis: Secondary | ICD-10-CM | POA: Diagnosis not present

## 2017-12-09 DIAGNOSIS — N2581 Secondary hyperparathyroidism of renal origin: Secondary | ICD-10-CM | POA: Diagnosis not present

## 2017-12-11 DIAGNOSIS — N2581 Secondary hyperparathyroidism of renal origin: Secondary | ICD-10-CM | POA: Diagnosis not present

## 2017-12-11 DIAGNOSIS — Z992 Dependence on renal dialysis: Secondary | ICD-10-CM | POA: Diagnosis not present

## 2017-12-11 DIAGNOSIS — D631 Anemia in chronic kidney disease: Secondary | ICD-10-CM | POA: Diagnosis not present

## 2017-12-11 DIAGNOSIS — N186 End stage renal disease: Secondary | ICD-10-CM | POA: Diagnosis not present

## 2017-12-11 DIAGNOSIS — D509 Iron deficiency anemia, unspecified: Secondary | ICD-10-CM | POA: Diagnosis not present

## 2017-12-14 DIAGNOSIS — N2581 Secondary hyperparathyroidism of renal origin: Secondary | ICD-10-CM | POA: Diagnosis not present

## 2017-12-14 DIAGNOSIS — D631 Anemia in chronic kidney disease: Secondary | ICD-10-CM | POA: Diagnosis not present

## 2017-12-14 DIAGNOSIS — N186 End stage renal disease: Secondary | ICD-10-CM | POA: Diagnosis not present

## 2017-12-14 DIAGNOSIS — Z992 Dependence on renal dialysis: Secondary | ICD-10-CM | POA: Diagnosis not present

## 2017-12-14 DIAGNOSIS — D509 Iron deficiency anemia, unspecified: Secondary | ICD-10-CM | POA: Diagnosis not present

## 2017-12-14 DIAGNOSIS — E119 Type 2 diabetes mellitus without complications: Secondary | ICD-10-CM | POA: Diagnosis not present

## 2017-12-16 DIAGNOSIS — N186 End stage renal disease: Secondary | ICD-10-CM | POA: Diagnosis not present

## 2017-12-16 DIAGNOSIS — D631 Anemia in chronic kidney disease: Secondary | ICD-10-CM | POA: Diagnosis not present

## 2017-12-16 DIAGNOSIS — Z992 Dependence on renal dialysis: Secondary | ICD-10-CM | POA: Diagnosis not present

## 2017-12-16 DIAGNOSIS — N2581 Secondary hyperparathyroidism of renal origin: Secondary | ICD-10-CM | POA: Diagnosis not present

## 2017-12-16 DIAGNOSIS — D509 Iron deficiency anemia, unspecified: Secondary | ICD-10-CM | POA: Diagnosis not present

## 2017-12-18 DIAGNOSIS — D509 Iron deficiency anemia, unspecified: Secondary | ICD-10-CM | POA: Diagnosis not present

## 2017-12-18 DIAGNOSIS — N2581 Secondary hyperparathyroidism of renal origin: Secondary | ICD-10-CM | POA: Diagnosis not present

## 2017-12-18 DIAGNOSIS — D631 Anemia in chronic kidney disease: Secondary | ICD-10-CM | POA: Diagnosis not present

## 2017-12-18 DIAGNOSIS — N186 End stage renal disease: Secondary | ICD-10-CM | POA: Diagnosis not present

## 2017-12-18 DIAGNOSIS — Z992 Dependence on renal dialysis: Secondary | ICD-10-CM | POA: Diagnosis not present

## 2017-12-21 ENCOUNTER — Encounter: Admit: 2017-12-21 | Discharge: 2017-12-21 | Payer: MEDICARE | Attending: Nephrology | Primary: Nephrology

## 2017-12-21 ENCOUNTER — Ambulatory Visit
Admission: RE | Admit: 2017-12-21 | Discharge: 2017-12-21 | Disposition: A | Discharge status: Home / Self Care | Payer: Medicare Other | Source: Ambulatory Visit | Attending: Physical Medicine and Rehabilitation | Admitting: Physical Medicine and Rehabilitation

## 2017-12-21 DIAGNOSIS — M5116 Intervertebral disc disorders with radiculopathy, lumbar region: Secondary | ICD-10-CM | POA: Diagnosis not present

## 2017-12-21 DIAGNOSIS — M5144 Schmorl's nodes, thoracic region: Secondary | ICD-10-CM | POA: Diagnosis not present

## 2017-12-21 DIAGNOSIS — M5416 Radiculopathy, lumbar region: Secondary | ICD-10-CM | POA: Diagnosis present

## 2017-12-21 DIAGNOSIS — Z992 Dependence on renal dialysis: Secondary | ICD-10-CM | POA: Diagnosis not present

## 2017-12-21 DIAGNOSIS — M545 Low back pain: Secondary | ICD-10-CM | POA: Diagnosis not present

## 2017-12-21 DIAGNOSIS — D509 Iron deficiency anemia, unspecified: Secondary | ICD-10-CM | POA: Diagnosis not present

## 2017-12-21 DIAGNOSIS — M5417 Radiculopathy, lumbosacral region: Secondary | ICD-10-CM

## 2017-12-21 DIAGNOSIS — D631 Anemia in chronic kidney disease: Secondary | ICD-10-CM | POA: Diagnosis not present

## 2017-12-21 DIAGNOSIS — N2581 Secondary hyperparathyroidism of renal origin: Secondary | ICD-10-CM | POA: Diagnosis not present

## 2017-12-21 DIAGNOSIS — N186 End stage renal disease: Secondary | ICD-10-CM | POA: Diagnosis not present

## 2017-12-23 DIAGNOSIS — N2581 Secondary hyperparathyroidism of renal origin: Secondary | ICD-10-CM | POA: Diagnosis not present

## 2017-12-23 DIAGNOSIS — Z992 Dependence on renal dialysis: Secondary | ICD-10-CM | POA: Diagnosis not present

## 2017-12-23 DIAGNOSIS — D631 Anemia in chronic kidney disease: Secondary | ICD-10-CM | POA: Diagnosis not present

## 2017-12-23 DIAGNOSIS — D509 Iron deficiency anemia, unspecified: Secondary | ICD-10-CM | POA: Diagnosis not present

## 2017-12-23 DIAGNOSIS — N186 End stage renal disease: Secondary | ICD-10-CM | POA: Diagnosis not present

## 2017-12-24 DIAGNOSIS — Z01818 Encounter for other preprocedural examination: Secondary | ICD-10-CM

## 2017-12-24 DIAGNOSIS — N186 End stage renal disease: Secondary | ICD-10-CM

## 2017-12-24 DIAGNOSIS — Z7682 Awaiting organ transplant status: Principal | ICD-10-CM

## 2017-12-25 DIAGNOSIS — N2581 Secondary hyperparathyroidism of renal origin: Secondary | ICD-10-CM | POA: Diagnosis not present

## 2017-12-25 DIAGNOSIS — D509 Iron deficiency anemia, unspecified: Secondary | ICD-10-CM | POA: Diagnosis not present

## 2017-12-25 DIAGNOSIS — Z992 Dependence on renal dialysis: Secondary | ICD-10-CM | POA: Diagnosis not present

## 2017-12-25 DIAGNOSIS — D631 Anemia in chronic kidney disease: Secondary | ICD-10-CM | POA: Diagnosis not present

## 2017-12-25 DIAGNOSIS — N186 End stage renal disease: Secondary | ICD-10-CM | POA: Diagnosis not present

## 2017-12-28 DIAGNOSIS — N2581 Secondary hyperparathyroidism of renal origin: Secondary | ICD-10-CM | POA: Diagnosis not present

## 2017-12-28 DIAGNOSIS — D631 Anemia in chronic kidney disease: Secondary | ICD-10-CM | POA: Diagnosis not present

## 2017-12-28 DIAGNOSIS — D509 Iron deficiency anemia, unspecified: Secondary | ICD-10-CM | POA: Diagnosis not present

## 2017-12-28 DIAGNOSIS — N186 End stage renal disease: Secondary | ICD-10-CM | POA: Diagnosis not present

## 2017-12-28 DIAGNOSIS — Z992 Dependence on renal dialysis: Secondary | ICD-10-CM | POA: Diagnosis not present

## 2017-12-29 ENCOUNTER — Encounter: Payer: Self-pay | Admitting: Internal Medicine

## 2017-12-29 ENCOUNTER — Ambulatory Visit (INDEPENDENT_AMBULATORY_CARE_PROVIDER_SITE_OTHER): Payer: Medicare Other | Admitting: Internal Medicine

## 2017-12-29 VITALS — BP 148/60 | HR 82 | Temp 98.6°F | Ht 67.0 in | Wt 169.8 lb

## 2017-12-29 DIAGNOSIS — R0902 Hypoxemia: Secondary | ICD-10-CM | POA: Diagnosis not present

## 2017-12-29 DIAGNOSIS — E1122 Type 2 diabetes mellitus with diabetic chronic kidney disease: Secondary | ICD-10-CM

## 2017-12-29 DIAGNOSIS — N186 End stage renal disease: Secondary | ICD-10-CM | POA: Diagnosis not present

## 2017-12-29 DIAGNOSIS — J449 Chronic obstructive pulmonary disease, unspecified: Secondary | ICD-10-CM

## 2017-12-29 DIAGNOSIS — R112 Nausea with vomiting, unspecified: Secondary | ICD-10-CM | POA: Diagnosis not present

## 2017-12-29 DIAGNOSIS — Z1231 Encounter for screening mammogram for malignant neoplasm of breast: Secondary | ICD-10-CM

## 2017-12-29 DIAGNOSIS — Z992 Dependence on renal dialysis: Secondary | ICD-10-CM

## 2017-12-29 DIAGNOSIS — R05 Cough: Secondary | ICD-10-CM

## 2017-12-29 DIAGNOSIS — R0602 Shortness of breath: Secondary | ICD-10-CM

## 2017-12-29 DIAGNOSIS — Z794 Long term (current) use of insulin: Secondary | ICD-10-CM

## 2017-12-29 DIAGNOSIS — R748 Abnormal levels of other serum enzymes: Secondary | ICD-10-CM | POA: Diagnosis not present

## 2017-12-29 DIAGNOSIS — R059 Cough, unspecified: Secondary | ICD-10-CM

## 2017-12-29 MED ORDER — UMECLIDINIUM-VILANTEROL 62.5-25 MCG/INH IN AEPB: 1.0000 | INHALATION_SPRAY | Freq: Every day | RESPIRATORY_TRACT | 0 refills | Status: DC

## 2017-12-29 NOTE — Progress Notes (Signed)
Chief Complaint  Patient presents with  . Follow-up   F/u  1. C/o reduced appetite, n/v. She has nausea med at home ? Name and protonix 40 is on her list but she is not taking it nor did she know she was supposed to take it. Weight is stable 2. H/o COPD c/o cough and recently ran out of lyrica which made cough worse and had w/d sxs from not taking it. She has home O2 not using O2 today with exertion was 90%. CTA chest 03/11/17 negative lung pathology.  3. ESRD on HD for 3hrs and 15 minutes on MWF. Dr. Holley Raring is renal MD  4. H/o atherosclerosis and grade 2 DD echo 05/30/13 cardiology Dr. Rockey Situ   Review of Systems  Constitutional: Negative for weight loss.  HENT: Negative for hearing loss.   Eyes: Negative for blurred vision.  Respiratory: Positive for cough and shortness of breath.   Cardiovascular: Negative for chest pain.  Gastrointestinal: Positive for nausea and vomiting. Negative for abdominal pain.       +reduce appetite   Musculoskeletal: Negative for falls.  Skin: Negative for rash.  Psychiatric/Behavioral: The patient has insomnia.    Past Medical History:  Diagnosis Date  . Anemia of chronic disease   . Carotid arterial disease ([place])    a. 02/2013 [facility]: 40-59% bilat ICA stenosis - *f/u 02/2014*  . Chronic constipation   . Chronic diastolic CHF (congestive heart failure) ([place])    a. 10/2013 [facility]): EF 55-60%, mod conc LVH, mod MR, mildly dil LA, mild Ao sclerosis w/o stenosis.  . Colon polyps   . COPD (chronic obstructive pulmonary disease) ([place])   . Coronary artery disease    a. 05/2013 NSTEMI/PCI: LM 20d, LAD min irregs, LCX small, nl, OM1 nl, RCA dom 60m (2.5x16 Promus DES), PDA1 80p.  . Diabetes mellitus   . Diabetic neuropathy ([place])   . Diabetic retinopathy ([place]) 05/28/2013   Hx bilat retinal detachment, proliferative diab retinopathy and bilat vitreous hemorrhage   . Emphysema   . ESRD on hemodialysis ([place])    a. [facility] in [place], [place] [phone].  [name], on a MWF schedule.  She started dialysis in Feb 2014.  Etiology of renal failure not known, likely diabetes.  Has a left upper arm AV graft.  . History of bronchitis    Mar 2012  . History of pneumonia    June 2012  . History of tobacco abuse    a. Quit 2012.  Marland Kitchen Hyperlipidemia   . Hypertension   . Moderate mitral insufficiency    a. 10/2013 Echo: EF 55-60%, mod MR.  . Myocardial infarct ([place]) 05/2013  . Peripheral vascular disease ([place])   . Renal insufficiency   . Sickle cell trait ([place])   . Thyroid nodule    [place] 06/2017 due to f/u US in 1 year    Past Surgical History:  Procedure Laterality Date  . A/V FISTULAGRAM Left 11/10/2017   Procedure: A/V FISTULAGRAM;  Surgeon: Katha Cabal, MD;  Location: [place] CV [facility];  Service: Cardiovascular;  Laterality: Left;  . ABDOMINAL HYSTERECTOMY     2000  . CARDIAC CATHETERIZATION    . CARDIAC CATHETERIZATION N/A 05/02/2015   Procedure: Left Heart Cath and Coronary Angiography;  Surgeon: [name], MD;  Location: [place] CV [facility];  Service: Cardiovascular;  Laterality: N/A;  . COLONOSCOPY WITH PROPOFOL N/A 07/08/2016   Procedure: COLONOSCOPY WITH PROPOFOL;  Surgeon: [name], MD;  Location: [facility] ENDOSCOPY;  Service: Endoscopy;  Laterality: N/A;  . COLONOSCOPY WITH PROPOFOL N/A 08/20/2017   Procedure: COLONOSCOPY WITH PROPOFOL;  Surgeon: [name], MD;  Location: [facility] ENDOSCOPY;  Service: Gastroenterology;  Laterality: N/A;  . colonscopy    . CORONARY ANGIOPLASTY  05/28/2014   stent placement to the mid RCA  . DILATION AND CURETTAGE OF UTERUS     several in the early 80's  . ESOPHAGOGASTRODUODENOSCOPY     2012  . EYE SURGERY     bilateral laser 2012  . EYE SURGERY     right  . EYE SURGERY     x4 both eyes  . GAS INSERTION  09/30/2011   Procedure: INSERTION OF GAS;  Surgeon: [name], MD;  Location: [place];  Service: Ophthalmology;  Laterality: Right;  [id number]  . GAS/FLUID EXCHANGE  09/30/2011    Procedure: GAS/FLUID EXCHANGE;  Surgeon: [name], MD;  Location: [place];  Service: Ophthalmology;  Laterality: Right;  . LEFT HEART CATHETERIZATION WITH CORONARY ANGIOGRAM N/A 05/28/2013   Procedure: LEFT HEART CATHETERIZATION WITH CORONARY ANGIOGRAM;  Surgeon: [name], MD;  Location: [facility] CATH LAB;  Service: Cardiovascular;  Laterality: N/A;  . PARS PLANA VITRECTOMY  04/22/2011   Procedure: PARS PLANA VITRECTOMY WITH 25 GAUGE;  Surgeon: [name], MD;  Location: [place];  Service: Ophthalmology;  Laterality: Left;  membrane peel, endolaser, gas fluid exchange, silicone oil, repair of complex traction retinal detachment  . PARS PLANA VITRECTOMY  09/30/2011   Procedure: PARS PLANA VITRECTOMY WITH 25 GAUGE;  Surgeon: [name], MD;  Location: [place];  Service: Ophthalmology;  Laterality: Right;  Endolaser; Repair of Complex Traction Retinal Detachment  . PARS PLANA VITRECTOMY  02/24/2012   Procedure: PARS PLANA VITRECTOMY WITH 25 GAUGE;  Surgeon: [name], MD;  Location: [place];  Service: Ophthalmology;  Laterality: Left;  . PTCA    . SILICON OIL REMOVAL  8/52/7782   Procedure: SILICON OIL REMOVAL;  Surgeon: [name], MD;  Location: [place];  Service: Ophthalmology;  Laterality: Left;  . THROMBECTOMY / ARTERIOVENOUS GRAFT REVISION    . TUBAL LIGATION     1979   Family History  Problem Relation Age of Onset  . Stroke Mother   . Heart attack Mother   . Heart disease Mother   . Colon cancer Father   . Colon cancer Sister   . Heart attack Brother   . Stroke Brother   . Diabetes Brother   . Breast cancer Sister   . Diabetes Brother   . Diabetes Daughter   . Anesthesia problems Neg Hx   . Hypotension Neg Hx   . Malignant hyperthermia Neg Hx   . Pseudochol deficiency Neg Hx    Social History   Socioeconomic History  . Marital status: Single    Spouse name: Not on file  . Number of children: 2  . Years of education: Not on file  . Highest education  level: Not on file  Occupational History  . Not on file  Social Needs  . Financial resource strain: Not on file  . Food insecurity:    Worry: Not on file    Inability: Not on file  . Transportation needs:    Medical: Not on file    Non-medical: Not on file  Tobacco Use  . Smoking status: Former Smoker    Packs/day: 1.00    Years: 25.00    Pack years: 25.00    Last attempt to  quit: 06/09/2010    Years since quitting: 7.5  . Smokeless tobacco: Never Used  Substance and Sexual Activity  . Alcohol use: No  . Drug use: No  . Sexual activity: Never  Lifestyle  . Physical activity:    Days per week: Not on file    Minutes per session: Not on file  . Stress: Not on file  Relationships  . Social connections:    Talks on phone: Not on file    Gets together: Not on file    Attends religious service: Not on file    Active member of club or organization: Not on file    Attends meetings of clubs or organizations: Not on file    Relationship status: Not on file  . Intimate partner violence:    Fear of current or ex partner: Not on file    Emotionally abused: Not on file    Physically abused: Not on file    Forced sexual activity: Not on file  Other Topics Concern  . Not on file  Social History Narrative   On disability.    Lives alone.    2 children      Son drives her since her vision has decreased   Current Meds  Medication Sig  . acetaminophen (TYLENOL) 650 MG CR tablet Take 1,300 mg by mouth every 4 (four) hours as needed for pain.  Marland Kitchen albuterol (PROVENTIL HFA;VENTOLIN HFA) 108 (90 Base) MCG/ACT inhaler Inhale 2 puffs into the lungs every 6 (six) hours as needed for wheezing or shortness of breath.  Marland Kitchen albuterol (PROVENTIL) (2.5 MG/3ML) 0.083% nebulizer solution Take 2.5 mg by nebulization every 4 (four) hours as needed for wheezing or shortness of breath.  Marland Kitchen amLODipine (NORVASC) 10 MG tablet Take 10 mg by mouth daily.   Marland Kitchen aspirin EC 81 MG tablet Take 81 mg by mouth at  bedtime.   Marland Kitchen atorvastatin (LIPITOR) 40 MG tablet Take 1 tablet (40 mg total) by mouth daily.  Lorin Picket 1 GM 210 MG(Fe) tablet Take 420-630 mg by mouth See admin instructions. Take 630 mg with each meal and 420 mg with each snack  . carvedilol (COREG) 12.5 MG tablet IF BLOOD PRESSURE GREATER THAN 160, TAKE 2 TABLETS BY MOUTH TWICE DAILY, OTHERWISE TAKE 1 TABLET BY MOUTH TWICE DAILY.  . cinacalcet (SENSIPAR) 60 MG tablet Take 60 mg by mouth daily.  . cloNIDine (CATAPRES) 0.1 MG tablet Take 0.1 mg by mouth 2 (two) times daily.  . diphenhydrAMINE (BENADRYL) 25 MG tablet Take 25 mg by mouth daily as needed for allergies.  . furosemide (LASIX) 80 MG tablet Take 80 mg by mouth See admin instructions. Take 80 mg by mouth once daily on Sun, Tue, Thur, and Sat (non dialysis days)  . isosorbide mononitrate (IMDUR) 60 MG 24 hr tablet Take 1 tablet (60 mg total) by mouth daily.  Marland Kitchen losartan (COZAAR) 100 MG tablet Take 1 tablet (100 mg total) by mouth daily.  . Melatonin 5 MG TABS Take 5 mg by mouth at bedtime.  . multivitamin (RENA-VIT) TABS tablet TAKE ONE TABLET BY MOUTH ONCE DAILY  . nitroGLYCERIN (NITROSTAT) 0.4 MG SL tablet Place 1 tablet (0.4 mg total) under the tongue every 5 (five) minutes as needed for chest pain.  Marland Kitchen oxyCODONE-acetaminophen (PERCOCET/ROXICET) 5-325 MG tablet Take 1 tablet by mouth 2 (two) times daily as needed for pain.  . pantoprazole (PROTONIX) 40 MG tablet Take 1 tablet (40 mg total) by mouth daily.  . pregabalin (LYRICA)  75 MG capsule Take 1 capsule (75 mg total) by mouth daily.   Current Facility-Administered Medications for the 12/29/17 encounter (Office Visit) with McLean-Scocuzza, Nino Glow, MD  Medication  . betamethasone acetate-betamethasone sodium phosphate (CELESTONE) injection 3 mg   Allergies  Allergen Reactions  . Hydrocodone Hives  . Metformin Diarrhea and Other (See Comments)    Other reaction(s): Distress (finding)  . Lisinopril     Unknown reaction    Recent  Results (from the past 2160 hour(s))  Potassium [facility] vascular lab only)     Status: None   Collection Time: 11/10/17 12:47 PM  Result Value Ref Range   Potassium [facility] vascular lab) 4.7 3.5 - 5.1    Comment: Performed at [facility], [address] [place], [place] [address]   Objective  Body mass index is 26.59 kg/m. Wt Readings from Last 3 Encounters:  12/29/17 169 lb 12.8 oz (77 kg)  12/07/17 170 lb (77.1 kg)  11/10/17 164 lb (74.4 kg)   Temp Readings from Last 3 Encounters:  12/29/17 98.6 F (37 C) (Oral)  11/10/17 98.3 F (36.8 C)  09/27/17 97.8 F (36.6 C) (Oral)   BP Readings from Last 3 Encounters:  12/29/17 (!) 148/60  12/07/17 (!) 195/75  11/10/17 (!) 166/51   Pulse Readings from Last 3 Encounters:  12/29/17 82  12/07/17 68  11/10/17 77    Physical Exam  Constitutional: She is oriented to person, place, and time. She appears well-developed and well-nourished. She is cooperative.  HENT:  Head: Normocephalic and atraumatic.  Mouth/Throat: Oropharynx is clear and moist and mucous membranes are normal.  Eyes: Pupils are equal, round, and reactive to light. Conjunctivae are normal.  Cardiovascular: Normal rate, regular rhythm and normal heart sounds.  Pulmonary/Chest: Effort normal and breath sounds normal.  Placed on 2L O2 today by CMET  Neurological: She is alert and oriented to person, place, and time. Gait normal.  Skin: Skin is warm, dry and intact.  Psychiatric: She has a normal mood and affect. Her speech is normal and behavior is normal. Judgment and thought content normal. Cognition and memory are normal.  Nursing note and vitals reviewed.   Assessment   1. Anorexia, n/v, elevated alkaline phosphatase 167 09/26/17 ? Etiology will w/u GI etiology with US abdomen 2. H/o COPD with cough and sob with exertion O2 sat 90% today given 2L O2  3. HM Plan   1. US abdomen [facility]  Check labs CMET, ggt, cbc with HD does not make urine also check  TSH Of note per pt A1C with HD 4.8 recently and had lipid check recently need to get records.  2. Will do CXR [facility] further w/u sob, cough  Reviewed CT chest 03/11/17 no lung pathology  Trial of anoro with prn albuterol  Given Rx to pulse ox  3.  Had flu shot  pna 23 had  Tetanus UTD  Hep B immune  Consider shingrix in future   Pap neg 11/13/16 neg HPV s/p hysterectomy  mammo neg 01/13/17 referred today h/o multiple polyps colonoscopy 08/20/17 repeat in 3 years [facility] Former smoker x 25-30 years <1 ppd  Does not make urine  Last A1C 4.8 06/27/17 with HD prior to that  03/2017 5.6 per HD RN   Provider: Dr. Olivia Mackie McLean-Scocuzza-Internal Medicine

## 2017-12-29 NOTE — Patient Instructions (Addendum)
F/u in 7-10 days  Bring all medication bottles next time  Mammogram [place] 01/14/18 call to schedule cancel [facility] imaging Labs with dialysis  Get a pulse oximeter please   Nausea and Vomiting, Adult Feeling sick to your stomach (nausea) means that your stomach is upset or you feel like you have to throw up (vomit). Feeling more and more sick to your stomach can lead to throwing up. Throwing up happens when food and liquid from your stomach are thrown up and out the mouth. Throwing up can make you feel weak and cause you to get dehydrated. Dehydration can make you tired and thirsty, make you have a dry mouth, and make it so you pee (urinate) less often. Older adults and people with other diseases or a weak defense system (immune system) are at higher risk for dehydration. If you feel sick to your stomach or if you throw up, it is important to follow instructions from your doctor about how to take care of yourself. Follow these instructions at home: Eating and drinking Follow these instructions as told by your doctor:  Take an oral rehydration solution (ORS). This is a drink that is sold at pharmacies and stores.  Drink clear fluids in small amounts as you are able, such as: ? Water. ? Ice chips. ? Diluted fruit juice. ? Low-calorie sports drinks.  Eat bland, easy-to-digest foods in small amounts as you are able, such as: ? Bananas. ? Applesauce. ? Rice. ? Low-fat (lean) meats. ? Toast. ? Crackers.  Avoid fluids that have a lot of sugar or caffeine in them.  Avoid alcohol.  Avoid spicy or fatty foods.  General instructions  Drink enough fluid to keep your pee (urine) clear or pale yellow.  Wash your hands often. If you cannot use soap and water, use hand sanitizer.  Make sure that all people in your home wash their hands well and often.  Take over-the-counter and prescription medicines only as told by your doctor.  Rest at home while you get better.  Watch your  condition for any changes.  Breathe slowly and deeply when you feel sick to your stomach.  Keep all follow-up visits as told by your doctor. This is important. Contact a doctor if:  You have a fever.  You cannot keep fluids down.  Your symptoms get worse.  You have new symptoms.  You feel sick to your stomach for more than two days.  You feel light-headed or dizzy.  You have a headache.  You have muscle cramps. Get help right away if:  You have pain in your chest, neck, arm, or jaw.  You feel very weak or you pass out (faint).  You throw up again and again.  You see blood in your throw-up.  Your throw-up looks like black coffee grounds.  You have bloody or black poop (stools) or poop that look like tar.  You have a very bad headache, a stiff neck, or both.  You have a rash.  You have very bad pain, cramping, or bloating in your belly (abdomen).  You have trouble breathing.  You are breathing very quickly.  Your heart is beating very quickly.  Your skin feels cold and clammy.  You feel confused.  You have pain when you pee.  You have signs of dehydration, such as: ? Dark pee, hardly any pee, or no pee. ? Cracked lips. ? Dry mouth. ? Sunken eyes. ? Sleepiness. ? Weakness. These symptoms may be an emergency. Do not  wait to see if the symptoms will go away. Get medical help right away. Call your local emergency services (911 in the [place] Do not drive yourself to the hospital. This information is not intended to replace advice given to you by your health care provider. Make sure you discuss any questions you have with your health care provider. Document Released: 11/12/2007 Document Revised: 12/14/2015 Document Reviewed: 01/30/2015 Elsevier Interactive Patient Education  2018 [employer].

## 2017-12-29 NOTE — Progress Notes (Signed)
Pre visit review using our clinic review tool, if applicable. No additional management support is needed unless otherwise documented below in the visit note. 

## 2017-12-30 ENCOUNTER — Telehealth: Payer: Self-pay | Admitting: Family

## 2017-12-30 DIAGNOSIS — D509 Iron deficiency anemia, unspecified: Secondary | ICD-10-CM | POA: Diagnosis not present

## 2017-12-30 DIAGNOSIS — D631 Anemia in chronic kidney disease: Secondary | ICD-10-CM | POA: Diagnosis not present

## 2017-12-30 DIAGNOSIS — N2581 Secondary hyperparathyroidism of renal origin: Secondary | ICD-10-CM | POA: Diagnosis not present

## 2017-12-30 DIAGNOSIS — Z992 Dependence on renal dialysis: Secondary | ICD-10-CM | POA: Diagnosis not present

## 2017-12-30 DIAGNOSIS — N186 End stage renal disease: Secondary | ICD-10-CM | POA: Diagnosis not present

## 2017-12-30 NOTE — Telephone Encounter (Signed)
Copied from [place] [phone]. Topic: General - Other >> Dec 30, 2017 10:39 AM Lennox Solders wrote: Reason for CRM: mario nurse manager with [facility] is calling and needs clarification on lab order. Pt brought hand written order

## 2017-12-30 NOTE — Telephone Encounter (Signed)
Clarification

## 2017-12-31 ENCOUNTER — Ambulatory Visit (INDEPENDENT_AMBULATORY_CARE_PROVIDER_SITE_OTHER): Payer: Medicare Other | Admitting: Vascular Surgery

## 2018-01-01 DIAGNOSIS — N2581 Secondary hyperparathyroidism of renal origin: Secondary | ICD-10-CM | POA: Diagnosis not present

## 2018-01-01 DIAGNOSIS — Z992 Dependence on renal dialysis: Secondary | ICD-10-CM | POA: Diagnosis not present

## 2018-01-01 DIAGNOSIS — N186 End stage renal disease: Secondary | ICD-10-CM | POA: Diagnosis not present

## 2018-01-01 DIAGNOSIS — D509 Iron deficiency anemia, unspecified: Secondary | ICD-10-CM | POA: Diagnosis not present

## 2018-01-01 DIAGNOSIS — D631 Anemia in chronic kidney disease: Secondary | ICD-10-CM | POA: Diagnosis not present

## 2018-01-04 ENCOUNTER — Encounter: Payer: Self-pay | Admitting: Internal Medicine

## 2018-01-04 DIAGNOSIS — D631 Anemia in chronic kidney disease: Secondary | ICD-10-CM | POA: Diagnosis not present

## 2018-01-04 DIAGNOSIS — N2581 Secondary hyperparathyroidism of renal origin: Secondary | ICD-10-CM | POA: Diagnosis not present

## 2018-01-04 DIAGNOSIS — Z992 Dependence on renal dialysis: Secondary | ICD-10-CM | POA: Diagnosis not present

## 2018-01-04 DIAGNOSIS — D509 Iron deficiency anemia, unspecified: Secondary | ICD-10-CM | POA: Diagnosis not present

## 2018-01-04 DIAGNOSIS — N186 End stage renal disease: Secondary | ICD-10-CM | POA: Diagnosis not present

## 2018-01-05 ENCOUNTER — Telehealth: Payer: Self-pay | Admitting: Internal Medicine

## 2018-01-05 ENCOUNTER — Ambulatory Visit
Admission: RE | Admit: 2018-01-05 | Discharge: 2018-01-05 | Disposition: A | Discharge status: Home / Self Care | Payer: Medicare Other | Source: Ambulatory Visit | Attending: Internal Medicine | Admitting: Internal Medicine

## 2018-01-05 ENCOUNTER — Encounter: Payer: Self-pay | Admitting: Internal Medicine

## 2018-01-05 ENCOUNTER — Ambulatory Visit (INDEPENDENT_AMBULATORY_CARE_PROVIDER_SITE_OTHER): Payer: Medicare Other | Admitting: Internal Medicine

## 2018-01-05 VITALS — BP 138/68 | HR 79 | Temp 98.5°F | Ht 67.0 in | Wt 170.6 lb

## 2018-01-05 DIAGNOSIS — N186 End stage renal disease: Secondary | ICD-10-CM

## 2018-01-05 DIAGNOSIS — N261 Atrophy of kidney (terminal): Secondary | ICD-10-CM | POA: Diagnosis not present

## 2018-01-05 DIAGNOSIS — E785 Hyperlipidemia, unspecified: Secondary | ICD-10-CM | POA: Diagnosis not present

## 2018-01-05 DIAGNOSIS — R1013 Epigastric pain: Secondary | ICD-10-CM | POA: Diagnosis not present

## 2018-01-05 DIAGNOSIS — R05 Cough: Secondary | ICD-10-CM

## 2018-01-05 DIAGNOSIS — I252 Old myocardial infarction: Secondary | ICD-10-CM | POA: Diagnosis not present

## 2018-01-05 DIAGNOSIS — D649 Anemia, unspecified: Secondary | ICD-10-CM | POA: Diagnosis not present

## 2018-01-05 DIAGNOSIS — I10 Essential (primary) hypertension: Secondary | ICD-10-CM

## 2018-01-05 DIAGNOSIS — Z992 Dependence on renal dialysis: Secondary | ICD-10-CM

## 2018-01-05 DIAGNOSIS — J90 Pleural effusion, not elsewhere classified: Secondary | ICD-10-CM | POA: Insufficient documentation

## 2018-01-05 DIAGNOSIS — J449 Chronic obstructive pulmonary disease, unspecified: Secondary | ICD-10-CM | POA: Diagnosis not present

## 2018-01-05 DIAGNOSIS — I5032 Chronic diastolic (congestive) heart failure: Secondary | ICD-10-CM

## 2018-01-05 DIAGNOSIS — K921 Melena: Secondary | ICD-10-CM

## 2018-01-05 DIAGNOSIS — R059 Cough, unspecified: Secondary | ICD-10-CM

## 2018-01-05 DIAGNOSIS — R0602 Shortness of breath: Secondary | ICD-10-CM

## 2018-01-05 DIAGNOSIS — G8929 Other chronic pain: Secondary | ICD-10-CM | POA: Insufficient documentation

## 2018-01-05 DIAGNOSIS — I38 Endocarditis, valve unspecified: Secondary | ICD-10-CM | POA: Diagnosis not present

## 2018-01-05 DIAGNOSIS — R748 Abnormal levels of other serum enzymes: Secondary | ICD-10-CM | POA: Diagnosis not present

## 2018-01-05 DIAGNOSIS — R112 Nausea with vomiting, unspecified: Secondary | ICD-10-CM | POA: Diagnosis not present

## 2018-01-05 DIAGNOSIS — M549 Dorsalgia, unspecified: Secondary | ICD-10-CM

## 2018-01-05 DIAGNOSIS — I272 Pulmonary hypertension, unspecified: Secondary | ICD-10-CM

## 2018-01-05 HISTORY — DX: Old myocardial infarction: I25.2

## 2018-01-05 LAB — HEMOCCULT GUIAC POC 1CARD (OFFICE)

## 2018-01-05 MED ORDER — CARVEDILOL 12.5 MG PO TABS: ORAL_TABLET | ORAL | 3 refills | Status: DC

## 2018-01-05 MED ORDER — ATORVASTATIN CALCIUM 40 MG PO TABS: 40.0000 mg | ORAL_TABLET | Freq: Every day | ORAL | 3 refills | Status: DC

## 2018-01-05 MED ORDER — ONDANSETRON HCL 4 MG PO TABS: 4.0000 mg | ORAL_TABLET | Freq: Three times a day (TID) | ORAL | 2 refills | Status: DC | PRN

## 2018-01-05 NOTE — Patient Instructions (Addendum)
Normal left ventricular systolic function, ejection fraction > 55%  Left ventricular hypertrophy - mild to moderate  Diastolic dysfunction - grade II (elevated filling pressures)  Mitral annular calcification  Mitral regurgitation - moderate  Aortic sclerosis  Dilated left atrium - moderate  Normal right ventricular systolic function  F/u in 1 month   Nausea, Adult Feeling sick to your stomach (nausea) means that your stomach is upset or you feel like you have to throw up (vomit). Feeling sick to your stomach is usually not serious, but it may be an early sign of a more serious medical problem. As you feel sicker to your stomach, it can lead to throwing up (vomiting). If you throw up, or if you are not able to drink enough fluids, there is a risk of dehydration. Dehydration can make you feel tired and thirsty, have a dry mouth, and pee (urinate) less often. Older adults and people who have other diseases or a weak defense (immune) system have a higher risk of dehydration. The main goal of treating this condition is to:  Limit how often you feel sick to your stomach.  Prevent throwing up and dehydration.  Follow these instructions at home: Follow instructions from your doctor about how to care for yourself at home. Eating and drinking Follow these recommendations as told by your doctor:  Take an oral rehydration solution (ORS). This is a drink that is sold at pharmacies and stores.  Drink clear fluids in small amounts as you are able, such as: ? Water. ? Ice chips. ? Fruit juice that has water added (diluted fruit juice). ? Low-calorie sports drinks.  Eat bland, easy to digest foods in small amounts as you are able, such as: ? Bananas. ? Applesauce. ? Rice. ? Lean meats. ? Toast. ? Crackers.  Avoid drinking fluids that contain a lot of sugar or caffeine.  Avoid alcohol.  Avoid spicy or fatty foods.  General instructions  Drink enough fluid to keep your pee  (urine) clear or pale yellow.  Wash your hands often. If you cannot use soap and water, use hand sanitizer.  Make sure that all people in your household wash their hands well and often.  Rest at home while you get better.  Take over-the-counter and prescription medicines only as told by your doctor.  Breathe slowly and deeply when you feel sick to your stomach.  Watch your condition for any changes.  Keep all follow-up visits as told by your doctor. This is important. Contact a doctor if:  You have a headache.  You have new symptoms.  You feel sicker to your stomach.  You have a fever.  You feel light-headed or dizzy.  You throw up.  You are not able to keep fluids down. Get help right away if:  You have pain in your chest, neck, arm, or jaw.  You feel very weak or you pass out (faint).  You have throw up that is bright red or looks like coffee grounds.  You have bloody or black poop (stools), or poop that looks like tar.  You have a very bad headache, a stiff neck, or both.  You have very bad pain, cramping, or bloating in your belly.  You have a rash.  You have trouble breathing or you are breathing very quickly.  Your heart is beating very quickly.  Your skin feels cold and clammy.  You feel confused.  You have pain while peeing.  You have signs of dehydration, such as: ?  Dark pee, or very little or no pee. ? Cracked lips. ? Dry mouth. ? Sunken eyes. ? Sleepiness. ? Weakness. These symptoms may be an emergency. Do not wait to see if the symptoms will go away. Get medical help right away. Call your local emergency services (911 in the [place] Do not drive yourself to the hospital. This information is not intended to replace advice given to you by your health care provider. Make sure you discuss any questions you have with your health care provider. Document Released: 05/15/2011 Document Revised: 11/01/2015 Document Reviewed: 01/30/2015 Elsevier  Interactive Patient Education  [employer].

## 2018-01-05 NOTE — Addendum Note (Signed)
Addended by: Orland Mustard on: 01/05/2018 08:09 PM   Modules accepted: Orders

## 2018-01-05 NOTE — Progress Notes (Addendum)
Chief Complaint  Patient presents with  . Follow-up   F/u with son Jermaine 1. Pt brings in meds reviewed today all bottles in possession she is taking not taking Aspirin 81 mg qd, benadryl 25 mg prn, losartan 100 mg qd, melatonin 5 mg qd, protonix 40 mg qd   2. Still c/o n/v and abdominal pain had [place] today with negative GI etiology and b/l pleural effusions. She would like medication for nausea. She has had black stools but on iron pills unsure if blood and she reports blood cts dropped from 12.2 to 9 checked with [facility] 12/14/17 Dr. Holley Raring is renal MD [place] location  Reviewed labs she brought in today from HD 12/14/17 Alb 4.0, hbg 12.2, corrected Ca 9.2, phos 4.5, pth intact 710, K 4.3. She also has h/o vitamin D def 7 02/22/16 labs, TC 168 08/12/17 labs and glucose 178 12/13/1 7 labs with A1C 4.8 12/14/17  -of note she did have labs 1 week ago with HD which I ordered but HD may have had problem ordering GGT and will requested lab results done with HD recently   3. H/o COPD, pulmonary HTN, now with b/l pleural effusions last pfts 2014 will refer to [facility] for repeat pfts she tried anoro did not notice yet if helped and did not know it was a powder. She has been given home O2 in the past not does not use but feels sob with exertion esp.   4. ESRD on HD (Dr. Holley Raring) she is pending renal transplant waiting list at [facility]  5. Reviewed cardiac hx with h/o MI 2014, valvular heart disease, diastolic CHF will refer back to primary cardiologist Dr. Rockey Situ to f/u it has been a while. Reviewed echo 07/13/17 care everywhere EF >55% LVH mild to moderate grade 2 DD, mitral annular calcifications, MR moderate, aortic sclerosis, dilated LA (moderate)  6. Would like handicap form filled out due to chronic back pain 2/2 arthritis hard to walk long distances and has walker. She f/u with Dr. Sharlet Salina has had about 12 steroid shots in back over course of 3-4 years and pending appt with NS Dr. Daun Peacock though pt states  today does not want surgery until kidney transplant complete and on list    Review of Systems  Constitutional: Negative for weight loss.  HENT: Negative for hearing loss.   Eyes: Negative for blurred vision.  Respiratory: Positive for shortness of breath.   Cardiovascular: Negative for chest pain.  Gastrointestinal: Positive for abdominal pain. Negative for blood in stool.  Musculoskeletal: Negative for falls.  Skin: Negative for rash.  Neurological: Negative for headaches.  Psychiatric/Behavioral: Negative for depression.   Past Medical History:  Diagnosis Date  . Anemia of chronic disease   . Carotid arterial disease ([place])    a. 02/2013 U/S: 40-59% bilat ICA stenosis - *f/u 02/2014*  . Chronic constipation   . Chronic diastolic CHF (congestive heart failure) ([place])    a. 10/2013 Echo [facility]): EF 55-60%, mod conc LVH, mod MR, mildly dil LA, mild Ao sclerosis w/o stenosis.  . Colon polyps   . COPD (chronic obstructive pulmonary disease) ([place])   . Coronary artery disease    a. 05/2013 NSTEMI/PCI: LM 20d, LAD min irregs, LCX small, nl, OM1 nl, RCA dom 47m (2.5x16 Promus DES), PDA1 80p.  . Diabetes mellitus   . Diabetic neuropathy ([place])   . Diabetic retinopathy ([place]) 05/28/2013   Hx bilat retinal detachment, proliferative diab retinopathy and bilat vitreous hemorrhage   .  Emphysema   . ESRD on hemodialysis ([place])    a. [facility] in [place], [place] [phone]/Dr. [name], on a MWF schedule.  She started dialysis in Feb 2014.  Etiology of renal failure not known, likely diabetes.  Has a left upper arm AV graft.  . History of bronchitis    Mar 2012  . History of pneumonia    June 2012  . History of tobacco abuse    a. Quit 2012.  Marland Kitchen Hyperlipidemia   . Hypertension   . Moderate mitral insufficiency    a. 10/2013 Echo: EF 55-60%, mod MR.  . Myocardial infarct ([place]) 05/2013  . Peripheral vascular disease ([place])   . Renal insufficiency   . Sickle cell trait ([place])   . Thyroid nodule    [place]  06/2017 due to f/u US in 1 year    Past Surgical History:  Procedure Laterality Date  . A/V FISTULAGRAM Left 11/10/2017   Procedure: A/V FISTULAGRAM;  Surgeon: Katha Cabal, MD;  Location: [place] [facility];  Service: Cardiovascular;  Laterality: Left;  . ABDOMINAL HYSTERECTOMY     2000  . CARDIAC CATHETERIZATION    . CARDIAC CATHETERIZATION N/A 05/02/2015   Procedure: Left Heart Cath and Coronary Angiography;  Surgeon: [name], MD;  Location: [place] [facility];  Service: Cardiovascular;  Laterality: N/A;  . COLONOSCOPY WITH PROPOFOL N/A 07/08/2016   Procedure: COLONOSCOPY WITH PROPOFOL;  Surgeon: [name], MD;  Location: [facility] ENDOSCOPY;  Service: Endoscopy;  Laterality: N/A;  . COLONOSCOPY WITH PROPOFOL N/A 08/20/2017   Procedure: COLONOSCOPY WITH PROPOFOL;  Surgeon: [name], MD;  Location: [facility] ENDOSCOPY;  Service: Gastroenterology;  Laterality: N/A;  . colonscopy    . CORONARY ANGIOPLASTY  05/28/2014   stent placement to the mid RCA  . DILATION AND CURETTAGE OF UTERUS     several in the early 80's  . ESOPHAGOGASTRODUODENOSCOPY     2012  . EYE SURGERY     bilateral laser 2012  . EYE SURGERY     right  . EYE SURGERY     x4 both eyes  . GAS INSERTION  09/30/2011   Procedure: INSERTION OF GAS;  Surgeon: [name], MD;  Location: [place];  Service: Ophthalmology;  Laterality: Right;  [id number]  . GAS/FLUID EXCHANGE  09/30/2011   Procedure: GAS/FLUID EXCHANGE;  Surgeon: [name], MD;  Location: [place];  Service: Ophthalmology;  Laterality: Right;  . LEFT HEART CATHETERIZATION WITH CORONARY ANGIOGRAM N/A 05/28/2013   Procedure: LEFT HEART CATHETERIZATION WITH CORONARY ANGIOGRAM;  Surgeon: [name], MD;  Location: [facility] CATH LAB;  Service: Cardiovascular;  Laterality: N/A;  . PARS PLANA VITRECTOMY  04/22/2011   Procedure: PARS PLANA VITRECTOMY WITH 25 GAUGE;  Surgeon: [name], MD;  Location: [place];  Service: Ophthalmology;  Laterality: Left;   membrane peel, endolaser, gas fluid exchange, silicone oil, repair of complex traction retinal detachment  . PARS PLANA VITRECTOMY  09/30/2011   Procedure: PARS PLANA VITRECTOMY WITH 25 GAUGE;  Surgeon: [name], MD;  Location: [place];  Service: Ophthalmology;  Laterality: Right;  Endolaser; Repair of Complex Traction Retinal Detachment  . PARS PLANA VITRECTOMY  02/24/2012   Procedure: PARS PLANA VITRECTOMY WITH 25 GAUGE;  Surgeon: [name], MD;  Location: [place];  Service: Ophthalmology;  Laterality: Left;  . PTCA    . SILICON OIL REMOVAL  0/98/1191   Procedure: SILICON OIL REMOVAL;  Surgeon: [name], MD;  Location:  [place] OR;  Service: Ophthalmology;  Laterality: Left;  . THROMBECTOMY / ARTERIOVENOUS GRAFT REVISION    . TUBAL LIGATION     1979   Family History  Problem Relation Age of [place]  . Stroke Mother   . Heart attack Mother   . Heart disease Mother   . Colon cancer Father   . Colon cancer Sister   . Heart attack Brother   . Stroke Brother   . Diabetes Brother   . Breast cancer Sister   . Diabetes Brother   . Diabetes Daughter   . Anesthesia problems Neg Hx   . Hypotension Neg Hx   . Malignant hyperthermia Neg Hx   . Pseudochol deficiency Neg Hx    Social History   Socioeconomic History  . Marital status: Single    Spouse name: Not on file  . Number of children: 2  . Years of education: Not on file  . Highest education level: Not on file  Occupational History  . Not on file  Social Needs  . Financial resource strain: Not on file  . Food insecurity:    Worry: Not on file    Inability: Not on file  . Transportation needs:    Medical: Not on file    Non-medical: Not on file  Tobacco Use  . Smoking status: Former Smoker    Packs/day: 1.00    Years: 25.00    Pack years: 25.00    Last attempt to quit: 06/09/2010    Years since quitting: 7.5  . Smokeless tobacco: Never Used  Substance and Sexual Activity  . Alcohol use: No  . Drug use: No  .  Sexual activity: Never  Lifestyle  . Physical activity:    Days per week: Not on file    Minutes per session: Not on file  . Stress: Not on file  Relationships  . Social connections:    Talks on phone: Not on file    Gets together: Not on file    Attends religious service: Not on file    Active member of club or organization: Not on file    Attends meetings of clubs or organizations: Not on file    Relationship status: Not on file  . Intimate partner violence:    Fear of current or ex partner: Not on file    Emotionally abused: Not on file    Physically abused: Not on file    Forced sexual activity: Not on file  Other Topics Concern  . Not on file  Social History Narrative   On disability.    Lives alone.    2 children      Son drives her since her vision has decreased   Current Meds  Medication Sig  . amLODipine (NORVASC) 10 MG tablet Take 10 mg by mouth daily.   Marland Kitchen aspirin EC 81 MG tablet Take 81 mg by mouth at bedtime.   Marland Kitchen atorvastatin (LIPITOR) 40 MG tablet Take 1 tablet (40 mg total) by mouth daily.  Lorin Picket 1 GM 210 MG(Fe) tablet Take 420-630 mg by mouth See admin instructions. Take 630 mg with each meal and 420 mg with each snack  . carvedilol (COREG) 12.5 MG tablet IF BLOOD PRESSURE GREATER THAN 160, TAKE 2 TABLETS BY MOUTH TWICE DAILY, OTHERWISE TAKE 1 TABLET BY MOUTH TWICE DAILY.  . cinacalcet (SENSIPAR) 60 MG tablet Take 60 mg by mouth daily.  . cloNIDine (CATAPRES) 0.1 MG tablet Take 0.1 mg by mouth 2 (two)  times daily.  . furosemide (LASIX) 80 MG tablet Take 80 mg by mouth See admin instructions. Take 80 mg by mouth once daily on Sun, Tue, Thur, and Sat (non dialysis days)  . isosorbide mononitrate (IMDUR) 60 MG 24 hr tablet Take 1 tablet (60 mg total) by mouth daily.  . nitroGLYCERIN (NITROSTAT) 0.4 MG SL tablet Place 1 tablet (0.4 mg total) under the tongue every 5 (five) minutes as needed for chest pain.  Marland Kitchen oxyCODONE-acetaminophen (PERCOCET/ROXICET) 5-325 MG  tablet Take 1 tablet by mouth 2 (two) times daily as needed for pain.  . pregabalin (LYRICA) 75 MG capsule Take 1 capsule (75 mg total) by mouth daily.  Marland Kitchen umeclidinium-vilanterol (ANORO ELLIPTA) 62.5-25 MCG/INH AEPB Inhale 1 puff into the lungs daily.   Current Facility-Administered Medications for the 01/05/18 encounter (Office Visit) with McLean-Scocuzza, Nino Glow, MD  Medication  . betamethasone acetate-betamethasone sodium phosphate (CELESTONE) injection 3 mg   Allergies  Allergen Reactions  . Hydrocodone Hives  . Metformin Diarrhea and Other (See Comments)    Other reaction(s): Distress (finding)  . Lisinopril     Unknown reaction    Recent Results (from the past 2160 hour(s))  Potassium [facility] vascular lab only)     Status: None   Collection Time: 11/10/17 12:47 PM  Result Value Ref Range   Potassium [facility] vascular lab) 4.7 3.5 - 5.1    Comment: Performed at [facility], [address] [place], [place] [address]   Objective  Body mass index is 26.72 kg/m. Wt Readings from Last 3 Encounters:  01/05/18 170 lb 9.6 oz (77.4 kg)  12/29/17 169 lb 12.8 oz (77 kg)  12/07/17 170 lb (77.1 kg)   Temp Readings from Last 3 Encounters:  01/05/18 98.5 F (36.9 C) (Oral)  12/29/17 98.6 F (37 C) (Oral)  11/10/17 98.3 F (36.8 C)   BP Readings from Last 3 Encounters:  01/05/18 138/68  12/29/17 (!) 148/60  12/07/17 (!) 195/75   Pulse Readings from Last 3 Encounters:  01/05/18 79  12/29/17 82  12/07/17 68    Physical Exam  Constitutional: She is oriented to person, place, and time. Vital signs are normal. She appears well-developed and well-nourished. She is cooperative.  HENT:  Head: Normocephalic and atraumatic.  Mouth/Throat: Oropharynx is clear and moist and mucous membranes are normal.  Eyes: Pupils are equal, round, and reactive to light. Conjunctivae are normal.  Cardiovascular: Normal rate and regular rhythm.  Murmur heard. Murmur heard likely noise from  AVG vs cardiac murmur   Pulmonary/Chest: Effort normal and breath sounds normal. She has no wheezes.  Abdominal: Soft. Bowel sounds are normal. There is tenderness in the right lower quadrant, epigastric area and suprapubic area.  Neurological: She is alert and oriented to person, place, and time. Gait normal.  No walking device today   Skin: Skin is warm, dry and intact.  Psychiatric: She has a normal mood and affect. Her speech is normal and behavior is normal. Judgment and thought content normal. Cognition and memory are normal.  Nursing note and vitals reviewed.   Assessment   1. N/v/ab pain I.e epigastric fobt negative today 2. Copd, pulm htn, b/l pleural effusions and sob with exertion new  3. esrd on hd [facility]  4. MI 2014, valvular heart disease, diastolic CHF grade 2, mitral annual calcifications, MR moderate, aortic sclerosis, dilated LA (moderate)  5. Chronic back pain  6.HM 7. HTN Plan   1. Reviewed US abdomen today see hpi  07/07/17 CT ab/pelvis w/o contrast reviewed s/p hysterectomy mild atherosclerosis in b/l ext iliac arteries and b/l atrophic kidneys b/l otherwise unremarkable  Prn Zofran  Of note pt stopped protonix 40 mg or was never taking it  Consider referral to GI if continues EGD and further w/u I.e Dr. Sheliah Hatch Address with renal to see if could be related to esrd  2. Refer to pulm and consider pfts  Reviewed pfts care everywhere 2014 mild restriction noted  Trial of anoro pt unsure if working  3.  Need to get recent labs faxed to me  Fax note to Dr. Holley Raring ? If more fluid can be off with HD to help with pleural effusions/fluid balance  Pending transplant list [facility] 4.  Refer to Dr. Rockey Situ  Reviewed echo 07/13/17 with pt and son today  5. F/u Dr. Sharlet Salina s/p at least 12 inj. Over 3-4 years and pending appt with Dr. Daun Peacock NS  For now pt does not want surgery 6.  Had flu shot  pna 23 had 06/09/10 consider repeat if has not had with HD  Tetanus  UTD  Hep B immune  Consider shingrix in future   Pap neg 11/13/16 neg HPV s/p hysterectomy  mammo neg 01/13/17 referred today h/o multiple polyps colonoscopy 08/20/17 repeat in 3 years [facility] Former smoker x 25-30 years <1 ppd  Does not make urine  Last A1C 4.8 12/14/17 with HD prior to that  03/2017 5.6 per HD RN  7. Refilled coreg 12.5 mg bid on norvasc 10  Of note pt not taking losartan 100 mg qd, on lasix 80 mg qd   Provider: Dr. Olivia Mackie McLean-Scocuzza-Internal Medicine

## 2018-01-05 NOTE — Progress Notes (Signed)
Pre visit review using our clinic review tool, if applicable. No additional management support is needed unless otherwise documented below in the visit note. 

## 2018-01-05 NOTE — Telephone Encounter (Signed)
Does she want to see GI for upper endoscopy to look down her throat into stomach given nausea and  Vomiting ? Dr. Vicente Males?  [place]

## 2018-01-06 ENCOUNTER — Other Ambulatory Visit: Payer: Self-pay | Admitting: Internal Medicine

## 2018-01-06 DIAGNOSIS — Z992 Dependence on renal dialysis: Secondary | ICD-10-CM | POA: Diagnosis not present

## 2018-01-06 DIAGNOSIS — R112 Nausea with vomiting, unspecified: Secondary | ICD-10-CM

## 2018-01-06 DIAGNOSIS — N2581 Secondary hyperparathyroidism of renal origin: Secondary | ICD-10-CM | POA: Diagnosis not present

## 2018-01-06 DIAGNOSIS — D631 Anemia in chronic kidney disease: Secondary | ICD-10-CM | POA: Diagnosis not present

## 2018-01-06 DIAGNOSIS — D509 Iron deficiency anemia, unspecified: Secondary | ICD-10-CM | POA: Diagnosis not present

## 2018-01-06 DIAGNOSIS — N186 End stage renal disease: Secondary | ICD-10-CM | POA: Diagnosis not present

## 2018-01-06 NOTE — Telephone Encounter (Signed)
Called patient and she states that she is willing to have the upper endoscopy and informed her that she will be called with an appointment date and time after referral is put in.

## 2018-01-07 DIAGNOSIS — D631 Anemia in chronic kidney disease: Secondary | ICD-10-CM | POA: Diagnosis not present

## 2018-01-07 DIAGNOSIS — Z992 Dependence on renal dialysis: Secondary | ICD-10-CM | POA: Diagnosis not present

## 2018-01-07 DIAGNOSIS — N2581 Secondary hyperparathyroidism of renal origin: Secondary | ICD-10-CM | POA: Diagnosis not present

## 2018-01-07 DIAGNOSIS — M5416 Radiculopathy, lumbar region: Secondary | ICD-10-CM | POA: Diagnosis not present

## 2018-01-07 DIAGNOSIS — D509 Iron deficiency anemia, unspecified: Secondary | ICD-10-CM | POA: Diagnosis not present

## 2018-01-07 DIAGNOSIS — M47816 Spondylosis without myelopathy or radiculopathy, lumbar region: Secondary | ICD-10-CM | POA: Diagnosis not present

## 2018-01-07 DIAGNOSIS — M5441 Lumbago with sciatica, right side: Secondary | ICD-10-CM | POA: Diagnosis not present

## 2018-01-07 DIAGNOSIS — G8929 Other chronic pain: Secondary | ICD-10-CM | POA: Diagnosis not present

## 2018-01-07 DIAGNOSIS — N186 End stage renal disease: Secondary | ICD-10-CM | POA: Diagnosis not present

## 2018-01-07 NOTE — Progress Notes (Signed)
Note & imaging results sent electronically

## 2018-01-07 NOTE — Progress Notes (Signed)
No vaccines in [place]

## 2018-01-08 DIAGNOSIS — N186 End stage renal disease: Secondary | ICD-10-CM | POA: Diagnosis not present

## 2018-01-08 DIAGNOSIS — D631 Anemia in chronic kidney disease: Secondary | ICD-10-CM | POA: Diagnosis not present

## 2018-01-08 DIAGNOSIS — D509 Iron deficiency anemia, unspecified: Secondary | ICD-10-CM | POA: Diagnosis not present

## 2018-01-08 DIAGNOSIS — Z992 Dependence on renal dialysis: Secondary | ICD-10-CM | POA: Diagnosis not present

## 2018-01-08 DIAGNOSIS — N2581 Secondary hyperparathyroidism of renal origin: Secondary | ICD-10-CM | POA: Diagnosis not present

## 2018-01-11 ENCOUNTER — Encounter: Payer: Self-pay | Admitting: *Deleted

## 2018-01-11 DIAGNOSIS — D631 Anemia in chronic kidney disease: Secondary | ICD-10-CM | POA: Diagnosis not present

## 2018-01-11 DIAGNOSIS — N186 End stage renal disease: Secondary | ICD-10-CM | POA: Diagnosis not present

## 2018-01-11 DIAGNOSIS — Z992 Dependence on renal dialysis: Secondary | ICD-10-CM | POA: Diagnosis not present

## 2018-01-11 DIAGNOSIS — N2581 Secondary hyperparathyroidism of renal origin: Secondary | ICD-10-CM | POA: Diagnosis not present

## 2018-01-11 DIAGNOSIS — D509 Iron deficiency anemia, unspecified: Secondary | ICD-10-CM | POA: Diagnosis not present

## 2018-01-12 ENCOUNTER — Ambulatory Visit: Payer: Self-pay | Admitting: *Deleted

## 2018-01-12 ENCOUNTER — Emergency Department: Payer: Medicare Other

## 2018-01-12 ENCOUNTER — Emergency Department
Admission: EM | Admit: 2018-01-12 | Discharge: 2018-01-12 | Disposition: A | Discharge status: Home / Self Care | Payer: Medicare Other | Attending: Student in an Organized Health Care Education/Training Program | Admitting: Student in an Organized Health Care Education/Training Program

## 2018-01-12 ENCOUNTER — Encounter: Payer: Self-pay | Admitting: Emergency Medicine

## 2018-01-12 DIAGNOSIS — Z79899 Other long term (current) drug therapy: Secondary | ICD-10-CM | POA: Insufficient documentation

## 2018-01-12 DIAGNOSIS — I5032 Chronic diastolic (congestive) heart failure: Secondary | ICD-10-CM | POA: Insufficient documentation

## 2018-01-12 DIAGNOSIS — R600 Localized edema: Secondary | ICD-10-CM | POA: Insufficient documentation

## 2018-01-12 DIAGNOSIS — Z7982 Long term (current) use of aspirin: Secondary | ICD-10-CM | POA: Insufficient documentation

## 2018-01-12 DIAGNOSIS — J989 Respiratory disorder, unspecified: Secondary | ICD-10-CM | POA: Diagnosis not present

## 2018-01-12 DIAGNOSIS — N186 End stage renal disease: Secondary | ICD-10-CM | POA: Diagnosis not present

## 2018-01-12 DIAGNOSIS — R0602 Shortness of breath: Secondary | ICD-10-CM

## 2018-01-12 DIAGNOSIS — J449 Chronic obstructive pulmonary disease, unspecified: Secondary | ICD-10-CM | POA: Insufficient documentation

## 2018-01-12 DIAGNOSIS — I132 Hypertensive heart and chronic kidney disease with heart failure and with stage 5 chronic kidney disease, or end stage renal disease: Secondary | ICD-10-CM | POA: Diagnosis not present

## 2018-01-12 DIAGNOSIS — Z992 Dependence on renal dialysis: Secondary | ICD-10-CM | POA: Insufficient documentation

## 2018-01-12 DIAGNOSIS — Z955 Presence of coronary angioplasty implant and graft: Secondary | ICD-10-CM | POA: Diagnosis not present

## 2018-01-12 DIAGNOSIS — I252 Old myocardial infarction: Secondary | ICD-10-CM | POA: Insufficient documentation

## 2018-01-12 DIAGNOSIS — E1122 Type 2 diabetes mellitus with diabetic chronic kidney disease: Secondary | ICD-10-CM | POA: Diagnosis not present

## 2018-01-12 DIAGNOSIS — I2510 Atherosclerotic heart disease of native coronary artery without angina pectoris: Secondary | ICD-10-CM | POA: Insufficient documentation

## 2018-01-12 DIAGNOSIS — I120 Hypertensive chronic kidney disease with stage 5 chronic kidney disease or end stage renal disease: Secondary | ICD-10-CM | POA: Diagnosis not present

## 2018-01-12 DIAGNOSIS — Z87891 Personal history of nicotine dependence: Secondary | ICD-10-CM | POA: Insufficient documentation

## 2018-01-12 DIAGNOSIS — M7989 Other specified soft tissue disorders: Secondary | ICD-10-CM | POA: Diagnosis not present

## 2018-01-12 LAB — TROPONIN I: Troponin I: 0.03 ng/mL (ref <0.03)

## 2018-01-12 LAB — CBC WITH DIFFERENTIAL/PLATELET
BASOS ABS: 0.1 10*3/uL (ref 0–0.1)
Basophils Relative: 1 %
EOS PCT: 3 %
Eosinophils Absolute: 0.3 10*3/uL (ref 0–0.7)
HCT: 25.3 % — ABNORMAL LOW (ref 35.0–47.0)
Hemoglobin: 8.8 g/dL — ABNORMAL LOW (ref 12.0–16.0)
Lymphocytes Relative: 12 %
Lymphs Abs: 1.2 10*3/uL (ref 1.0–3.6)
MCH: 36.6 pg — ABNORMAL HIGH (ref 26.0–34.0)
MCHC: 34.9 g/dL (ref 32.0–36.0)
MCV: 104.7 fL — ABNORMAL HIGH (ref 80.0–100.0)
MONO ABS: 0.4 10*3/uL (ref 0.2–0.9)
Monocytes Relative: 4 %
NEUTROS ABS: 8.6 10*3/uL — AB (ref 1.4–6.5)
Neutrophils Relative %: 80 %
Platelets: 212 10*3/uL (ref 150–440)
RBC: 2.41 MIL/uL — ABNORMAL LOW (ref 3.80–5.20)
RDW: 19.7 % — AB (ref 11.5–14.5)
WBC: 10.6 10*3/uL (ref 3.6–11.0)

## 2018-01-12 LAB — BLOOD GAS, VENOUS
Acid-Base Excess: 11 mmol/L — ABNORMAL HIGH (ref 0.0–2.0)
BICARBONATE: 35.1 mmol/L — AB (ref 20.0–28.0)
O2 Saturation: 93 %
PATIENT TEMPERATURE: 37
PO2 VEN: 60 mmHg — AB (ref 32.0–45.0)
pCO2, Ven: 44 mmHg (ref 44.0–60.0)
pH, Ven: 7.51 — ABNORMAL HIGH (ref 7.250–7.430)

## 2018-01-12 LAB — COMPREHENSIVE METABOLIC PANEL
ALT: 14 U/L (ref 0–44)
AST: 33 U/L (ref 15–41)
Albumin: 3.5 g/dL (ref 3.5–5.0)
Alkaline Phosphatase: 150 U/L — ABNORMAL HIGH (ref 38–126)
Anion gap: 9 (ref 5–15)
BUN: 18 mg/dL (ref 6–20)
CO2: 34 mmol/L — ABNORMAL HIGH (ref 22–32)
Calcium: 8.3 mg/dL — ABNORMAL LOW (ref 8.9–10.3)
Chloride: 98 mmol/L (ref 98–111)
Creatinine, Ser: 6.77 mg/dL — ABNORMAL HIGH (ref 0.44–1.00)
GFR calc Af Amer: 7 mL/min — ABNORMAL LOW (ref >60)
GFR calc non Af Amer: 6 mL/min — ABNORMAL LOW (ref >60)
Glucose, Bld: 142 mg/dL — ABNORMAL HIGH (ref 70–99)
POTASSIUM: 3.9 mmol/L (ref 3.5–5.1)
Sodium: 141 mmol/L (ref 135–145)
Total Bilirubin: 1 mg/dL (ref 0.3–1.2)
Total Protein: 6.3 g/dL — ABNORMAL LOW (ref 6.5–8.1)

## 2018-01-12 MED ORDER — TRAMADOL HCL 50 MG PO TABS
50.0000 mg | ORAL_TABLET | Freq: Once | ORAL | Status: AC
Start: 2018-01-12 — End: 2018-01-12
  Administered 2018-01-12: 50 mg via ORAL
  Filled 2018-01-12: qty 1

## 2018-01-12 MED ORDER — METHYLPREDNISOLONE SODIUM SUCC 125 MG IJ SOLR
125.0000 mg | Freq: Once | INTRAMUSCULAR | Status: AC
  Administered 2018-01-12: 125 mg via INTRAVENOUS
  Filled 2018-01-12: qty 2

## 2018-01-12 MED ORDER — ALBUTEROL SULFATE (2.5 MG/3ML) 0.083% IN NEBU
2.5000 mg | INHALATION_SOLUTION | Freq: Once | RESPIRATORY_TRACT | Status: AC
  Administered 2018-01-12: 2.5 mg via RESPIRATORY_TRACT
  Filled 2018-01-12: qty 3

## 2018-01-12 MED ORDER — IPRATROPIUM-ALBUTEROL 0.5-2.5 (3) MG/3ML IN SOLN
3.0000 mL | Freq: Once | RESPIRATORY_TRACT | Status: AC
  Administered 2018-01-12: 3 mL via RESPIRATORY_TRACT
  Filled 2018-01-12: qty 3

## 2018-01-12 MED ORDER — CLONIDINE HCL 0.1 MG PO TABS
0.2000 mg | ORAL_TABLET | Freq: Once | ORAL | Status: AC
  Administered 2018-01-12: 0.2 mg via ORAL
  Filled 2018-01-12: qty 2

## 2018-01-12 MED ORDER — IOPAMIDOL (ISOVUE-370) INJECTION 76%
75.0000 mL | Freq: Once | INTRAVENOUS | Status: AC | PRN
  Administered 2018-01-12: 75 mL via INTRAVENOUS

## 2018-01-12 NOTE — ED Triage Notes (Signed)
Pt presents to ED via [facility] from home c/o SOB. Pt reports difficulty  breathing X 2 days.Pt reports she is uses oxygen at home as needed, but the last two days she has had to use it. Daughter checked oxygen saturation and reported it was 88%. Pt has hx of copd. Pt report she had chest xray last thursday- noticed spores in lungs, no follow up. duoneb tx with EMS. EMS reported pt 100% on 4L [place].  EMS vital signs: 187/84, 100 on 4L, NSR. Pt has left sided fistula and graft.

## 2018-01-12 NOTE — ED Notes (Signed)
Pt is waiting on her son to come and pick her up.

## 2018-01-12 NOTE — ED Notes (Signed)
Pt went to ultrasound.

## 2018-01-12 NOTE — Discharge Instructions (Addendum)
Please return immediately for any worsening chest pain worsening shortness of breath fevers or for any additional concerns.  Follow-up with dialysis tomorrow.

## 2018-01-12 NOTE — ED Notes (Signed)
  Pt currently receiving breathing treatment with son at bedside. Respirations even and unlabored, NAD. Stretcher lowest postion and locked. Call bell within reach. Denies any needs at this time RN will continue to monitor.

## 2018-01-12 NOTE — Telephone Encounter (Signed)
Patient triaged by [facility] nurse and informed to go to ED

## 2018-01-12 NOTE — ED Provider Notes (Addendum)
[facility] Emergency Department Provider Note    First MD Initiated Contact with Patient 01/12/18 1644     (approximate)  I have reviewed the triage vital signs and the nursing notes.   HISTORY  Chief Complaint Shortness of Breath    HPI Theresa [name] is a 62 y.o. female since of past medical history as listed below presents the ER with 2 days of worsening shortness of breath.  Patient has home oxygen to be worn as needed but she is been wearing it 24/7.  Has been using her inhalers and feels that her COPD is acting up.  Did have dialysis yesterday.  Denies any fevers or cough.  No nausea or vomiting.  Does feel that her legs are more swollen than usual but one is not larger than the other.  Denies any history of DVT.  Gets heparin with dialysis.  States that she has right-sided chest pain worsened with movement that she feels is secondary to coughing and shortness of breath.  She came to the ER today after her daughter checked her home oxygen level was 88% on room air.  Patient arrives to the ER on 2 L nasal cannula satting 100%.    Past Medical History:  Diagnosis Date  . Anemia of chronic disease   . Carotid arterial disease ([place])    a. 02/2013 U/S: 40-59% bilat ICA stenosis - *f/u 02/2014*  . Chronic constipation   . Chronic diastolic CHF (congestive heart failure) ([place])    a. 10/2013 Echo [facility] EF 55-60%, mod conc LVH, mod MR, mildly dil LA, mild Ao sclerosis w/o stenosis.  . Colon polyps   . COPD (chronic obstructive pulmonary disease) ([place])   . Coronary artery disease    a. 05/2013 NSTEMI/PCI: LM 20d, LAD min irregs, LCX small, nl, OM1 nl, RCA dom 108m (2.5x16 Promus DES), PDA1 80p.  . Diabetes mellitus   . Diabetic neuropathy ([place])   . Diabetic retinopathy ([place]) 05/28/2013   Hx bilat retinal detachment, proliferative diab retinopathy and bilat vitreous hemorrhage   . Emphysema   . ESRD on hemodialysis ([place])    a. [facility] in [place], [place] [phone]  [phone]/Dr. [name], on a MWF schedule.  She started dialysis in Feb 2014.  Etiology of renal failure not known, likely diabetes.  Has a left upper arm AV graft.  . History of bronchitis    Mar 2012  . History of pneumonia    June 2012  . History of tobacco abuse    a. Quit 2012.  Marland Kitchen Hyperlipidemia   . Hypertension   . Moderate mitral insufficiency    a. 10/2013 Echo: EF 55-60%, mod MR.  . Myocardial infarct ([place]) 05/2013  . Peripheral vascular disease ([place])   . Renal insufficiency   . Sickle cell trait ([place])   . Thyroid nodule    [place] 06/2017 due to f/u US in 1 year    Family History  Problem Relation Age of Onset  . Stroke Mother   . Heart attack Mother   . Heart disease Mother   . Colon cancer Father   . Colon cancer Sister   . Heart attack Brother   . Stroke Brother   . Diabetes Brother   . Breast cancer Sister   . Diabetes Brother   . Diabetes Daughter   . Anesthesia problems Neg Hx   . Hypotension Neg Hx   . Malignant hyperthermia Neg Hx   . Pseudochol deficiency Neg Hx  Past Surgical History:  Procedure Laterality Date  . A/V FISTULAGRAM Left 11/10/2017   Procedure: A/V FISTULAGRAM;  Surgeon: Katha Cabal, MD;  Location: [place] CV LAB;  Service: Cardiovascular;  Laterality: Left;  . ABDOMINAL HYSTERECTOMY     2000  . CARDIAC CATHETERIZATION    . CARDIAC CATHETERIZATION N/A 05/02/2015   Procedure: Left Heart Cath and Coronary Angiography;  Surgeon: [name], MD;  Location: [place] [facility];  Service: Cardiovascular;  Laterality: N/A;  . COLONOSCOPY WITH PROPOFOL N/A 07/08/2016   Procedure: COLONOSCOPY WITH PROPOFOL;  Surgeon: [name], MD;  Location: [facility] ENDOSCOPY;  Service: Endoscopy;  Laterality: N/A;  . COLONOSCOPY WITH PROPOFOL N/A 08/20/2017   Procedure: COLONOSCOPY WITH PROPOFOL;  Surgeon: [name], MD;  Location: [facility] ENDOSCOPY;  Service: Gastroenterology;  Laterality: N/A;  . colonscopy    . CORONARY ANGIOPLASTY  05/28/2014    stent placement to the mid RCA  . DILATION AND CURETTAGE OF UTERUS     several in the early 80's  . ESOPHAGOGASTRODUODENOSCOPY     2012  . EYE SURGERY     bilateral laser 2012  . EYE SURGERY     right  . EYE SURGERY     x4 both eyes  . GAS INSERTION  09/30/2011   Procedure: INSERTION OF GAS;  Surgeon: [name], MD;  Location: [place];  Service: Ophthalmology;  Laterality: Right;  [id number]  . GAS/FLUID EXCHANGE  09/30/2011   Procedure: GAS/FLUID EXCHANGE;  Surgeon: [name], MD;  Location: [place];  Service: Ophthalmology;  Laterality: Right;  . LEFT HEART CATHETERIZATION WITH CORONARY ANGIOGRAM N/A 05/28/2013   Procedure: LEFT HEART CATHETERIZATION WITH CORONARY ANGIOGRAM;  Surgeon: [name], MD;  Location: [facility] CATH LAB;  Service: Cardiovascular;  Laterality: N/A;  . PARS PLANA VITRECTOMY  04/22/2011   Procedure: PARS PLANA VITRECTOMY WITH 25 GAUGE;  Surgeon: [name], MD;  Location: [place];  Service: Ophthalmology;  Laterality: Left;  membrane peel, endolaser, gas fluid exchange, silicone oil, repair of complex traction retinal detachment  . PARS PLANA VITRECTOMY  09/30/2011   Procedure: PARS PLANA VITRECTOMY WITH 25 GAUGE;  Surgeon: [name], MD;  Location: [place];  Service: Ophthalmology;  Laterality: Right;  Endolaser; Repair of Complex Traction Retinal Detachment  . PARS PLANA VITRECTOMY  02/24/2012   Procedure: PARS PLANA VITRECTOMY WITH 25 GAUGE;  Surgeon: [name], MD;  Location: [place];  Service: Ophthalmology;  Laterality: Left;  . PTCA    . SILICON OIL REMOVAL  6/43/3295   Procedure: SILICON OIL REMOVAL;  Surgeon: [name], MD;  Location: [place];  Service: Ophthalmology;  Laterality: Left;  . THROMBECTOMY / ARTERIOVENOUS GRAFT REVISION    . TUBAL LIGATION     1979   Patient Active Problem List   Diagnosis Date Noted  . Valvular heart disease 01/05/2018  . History of heart attack 01/05/2018  . Chronic back pain 01/05/2018  . Nausea and  vomiting 12/29/2017  . Elevated alkaline phosphatase level 12/29/2017  . Thyroid nodule 08/11/2017  . CHF (congestive heart failure) ([place]) 07/26/2017  . PAD (peripheral artery disease) ([place]) 03/07/2017  . Bilateral carotid artery stenosis 02/17/2017  . Complication of vascular access for dialysis 06/19/2016  . Atypical chest pain 05/21/2016  . Mitral regurgitation 02/06/2016  . Screening for breast cancer 07/27/2015  . Dysuria 07/27/2015  . Chronic diastolic heart failure ([place]) 05/07/2015  . S/P coronary artery stent placement   .  Leg pain 03/05/2015  . Neuritis or radiculitis due to rupture of lumbar intervertebral disc 11/28/2014  . Routine general medical examination at a health care facility 09/02/2013  . Osteoarthritis of right hip 08/23/2013  . DDD (degenerative disc disease), lumbosacral 08/23/2013  . Coronary artery disease of native artery of native heart with stable angina pectoris ([place]) 06/27/2013  . Diabetic retinopathy ([place]) 05/28/2013  . Hyperlipidemia   . Syncope 01/21/2013  . Essential (primary) hypertension 11/08/2012  . Diabetic neuropathy ([place]) 02/18/2012  . Anemia in chronic kidney disease (CKD) 02/18/2012  . ESRD on hemodialysis ([place]) 08/21/2011  . Diabetes mellitus, type II ([place]) 06/30/2011  . COPD (chronic obstructive pulmonary disease) ([place]) 06/13/2011      Prior to Admission medications   Medication Sig Start Date End Date Taking? Authorizing Provider  albuterol (PROVENTIL) (2.5 MG/3ML) 0.083% nebulizer solution Take 2.5 mg by nebulization every 6 (six) hours as needed for wheezing or shortness of breath.    [provider]  albuterol (VENTOLIN HFA) 108 (90 Base) MCG/ACT inhaler Inhale into the lungs every 6 (six) hours as needed for wheezing or shortness of breath.    [provider]  amLODipine (NORVASC) 10 MG tablet Take 10 mg by mouth daily.  09/17/16   [provider]  aspirin EC 81 MG tablet Take 81 mg by mouth at bedtime.      [provider]  atorvastatin (LIPITOR) 40 MG tablet Take 1 tablet (40 mg total) by mouth daily. At night 01/05/18   McLean-Scocuzza, Nino Glow, MD  AURYXIA 1 GM 210 MG(Fe) tablet Take 420-630 mg by mouth 3 (three) times daily with meals. Take 630 mg with each meal and 420 mg with each snack 09/17/17   [provider]  carvedilol (COREG) 12.5 MG tablet 1 pill bid 01/05/18   McLean-Scocuzza, Nino Glow, MD  cinacalcet (SENSIPAR) 60 MG tablet Take 60 mg by mouth daily.    [provider]  cloNIDine (CATAPRES) 0.1 MG tablet Take 0.1 mg by mouth 2 (two) times daily.    [provider]  furosemide (LASIX) 80 MG tablet Take 80 mg by mouth See admin instructions. Take 80 mg by mouth once daily on Sun, Tue, Thur, and Sat (non dialysis days)    [provider]  isosorbide mononitrate (IMDUR) 60 MG 24 hr tablet Take 1 tablet (60 mg total) by mouth daily. 07/28/17   Max Sane, MD  multivitamin (RENA-VIT) TABS tablet Take 1 tablet by mouth daily.    [provider]  nitroGLYCERIN (NITROSTAT) 0.4 MG SL tablet Place 1 tablet (0.4 mg total) under the tongue every 5 (five) minutes as needed for chest pain. 01/15/17   Burnard Hawthorne, FNP  ondansetron (ZOFRAN) 4 MG tablet Take 1 tablet (4 mg total) by mouth every 8 (eight) hours as needed for nausea or vomiting. 01/05/18   McLean-Scocuzza, Nino Glow, MD  oxyCODONE-acetaminophen (PERCOCET/ROXICET) 5-325 MG tablet Take 1 tablet by mouth 2 (two) times daily as needed for pain.  10/04/17   [provider]  pregabalin (LYRICA) 75 MG capsule Take 1 capsule (75 mg total) by mouth daily. 04/21/16   Burnard Hawthorne, FNP  umeclidinium-vilanterol (ANORO ELLIPTA) 62.5-25 MCG/INH AEPB Inhale 1 puff into the lungs daily. 12/29/17   McLean-Scocuzza, Nino Glow, MD    Allergies Hydrocodone; Metformin; and Lisinopril    Social History Social History   Tobacco Use  . Smoking status: Former Smoker    Packs/day: 1.00     Years:  25.00    Pack years: 25.00    Last attempt to quit: 06/09/2010    Years since quitting: 7.6  . Smokeless tobacco: Never Used  Substance Use Topics  . Alcohol use: No  . Drug use: No    Review of Systems Patient denies headaches, rhinorrhea, blurry vision, numbness, shortness of breath, chest pain, edema, cough, abdominal pain, nausea, vomiting, diarrhea, dysuria, fevers, rashes or hallucinations unless otherwise stated above in HPI. ____________________________________________   PHYSICAL EXAM:  VITAL SIGNS: Vitals:   01/12/18 2130 01/12/18 2225  BP: (!) 179/68 (!) 169/72  Pulse: 83 79  Resp: (!) 24 (!) 24  Temp:    SpO2: 98% 98%    Constitutional: Alert and oriented.  Eyes: Conjunctivae are normal.  Head: Atraumatic. Nose: No congestion/rhinnorhea. Mouth/Throat: Mucous membranes are moist.   Neck: No stridor. Painless ROM.  Cardiovascular: Normal rate, regular rhythm. Grossly normal heart sounds.  Good peripheral circulation.  AV fistula with palpable frill Respiratory: Normal respiratory effort.  No retractions. Lungs CTAB. Gastrointestinal: Soft and nontender. No distention. No abdominal bruits. No CVA tenderness. Genitourinary: deferred Musculoskeletal: No lower extremity tenderness nor edema.  No joint effusions. Neurologic:  Normal speech and language. No gross focal neurologic deficits are appreciated. No facial droop Skin:  Skin is warm, dry and intact. No rash noted. Psychiatric: Mood and affect are normal. Speech and behavior are normal.  ____________________________________________   LABS (all labs ordered are listed, but only abnormal results are displayed)  Results for orders placed or performed during the hospital encounter of 01/12/18 (from the past 24 hour(s))  CBC with Differential/Platelet     Status: Abnormal   Collection Time: 01/12/18  4:38 PM  Result Value Ref Range   WBC 10.6 3.6 - 11.0 K/uL   RBC 2.41 (L) 3.80 - 5.20 MIL/uL   Hemoglobin  8.8 (L) 12.0 - 16.0 g/dL   HCT 25.3 (L) 35.0 - 47.0 %   MCV 104.7 (H) 80.0 - 100.0 fL   MCH 36.6 (H) 26.0 - 34.0 pg   MCHC 34.9 32.0 - 36.0 g/dL   RDW 19.7 (H) 11.5 - 14.5 %   Platelets 212 150 - 440 K/uL   Neutrophils Relative % 80 %   Neutro Abs 8.6 (H) 1.4 - 6.5 K/uL   Lymphocytes Relative 12 %   Lymphs Abs 1.2 1.0 - 3.6 K/uL   Monocytes Relative 4 %   Monocytes Absolute 0.4 0.2 - 0.9 K/uL   Eosinophils Relative 3 %   Eosinophils Absolute 0.3 0 - 0.7 K/uL   Basophils Relative 1 %   Basophils Absolute 0.1 0 - 0.1 K/uL  Comprehensive metabolic panel     Status: Abnormal   Collection Time: 01/12/18  4:38 PM  Result Value Ref Range   Sodium 141 135 - 145 mmol/L   Potassium 3.9 3.5 - 5.1 mmol/L   Chloride 98 98 - 111 mmol/L   CO2 34 (H) 22 - 32 mmol/L   Glucose, Bld 142 (H) 70 - 99 mg/dL   BUN 18 6 - 20 mg/dL   Creatinine, Ser 6.77 (H) 0.44 - 1.00 mg/dL   Calcium 8.3 (L) 8.9 - 10.3 mg/dL   Total Protein 6.3 (L) 6.5 - 8.1 g/dL   Albumin 3.5 3.5 - 5.0 g/dL   AST 33 15 - 41 U/L   ALT 14 0 - 44 U/L   Alkaline Phosphatase 150 (H) 38 - 126 U/L   Total Bilirubin 1.0 0.3 - 1.2 mg/dL  GFR calc non Af Amer 6 (L) >60 mL/min   GFR calc Af Amer 7 (L) >60 mL/min   Anion gap 9 5 - 15  Troponin I     Status: Abnormal   Collection Time: 01/12/18  4:38 PM  Result Value Ref Range   Troponin I 0.03 (HH) <0.03 ng/mL  Blood gas, venous     Status: Abnormal   Collection Time: 01/12/18  6:00 PM  Result Value Ref Range   pH, Ven 7.51 (H) 7.250 - 7.430   pCO2, Ven 44 44.0 - 60.0 mmHg   pO2, Ven 60.0 (H) 32.0 - 45.0 mmHg   Bicarbonate 35.1 (H) 20.0 - 28.0 mmol/L   Acid-Base Excess 11.0 (H) 0.0 - 2.0 mmol/L   O2 Saturation 93.0 %   Patient temperature 37.0    Collection site VEIN    Sample type VEIN    ____________________________________________  EKG My review and personal interpretation at Time: 16:36   Indication: sob  Rate: 80  Rhythm: sinus Axis: normal Other: normal intervals, no  stem ____________________________________________  RADIOLOGY  I personally reviewed all radiographic images ordered to evaluate for the above acute complaints and reviewed radiology reports and findings.  These findings were personally discussed with the patient.  Please see medical record for radiology report.  ____________________________________________   PROCEDURES  Procedure(s) performed:  Procedures    Critical Care performed: no ____________________________________________   INITIAL IMPRESSION / ASSESSMENT AND PLAN / ED COURSE  Pertinent labs & imaging results that were available during my care of the patient were reviewed by me and considered in my medical decision making (see chart for details).   DDX: Asthma, copd, CHF, pna, ptx, malignancy, Pe, anemia   Theresa [name] is a 62 y.o. who presents to the ED with shortness of breath as described above.  She has no fever.  She has been using her oxygen around-the-clock.  Denies any midsternal chest pain but does have pain on the right chest wall with palpation.  EKG is unchanged from previous.  Blood work will be sent for the above differential.  The patient will be placed on continuous pulse oximetry and telemetry for monitoring.  Laboratory evaluation will be sent to evaluate for the above complaints.  Trial nebulizer to evaluate for component of bronchitis   Clinical Course as of Jan 13 2339  Tue Jan 12, 2018  2132 No evidence of PE.  Does have bilateral pleural effusions.  We will touch base with nephrology.   [PR]  2143 Discussed case with Dr. Zollie Scale of nephrology.  States has had similar episodes in the past where work-up here was reassuring but patient losing weight and dialysis requiring additional adjustments to obtain more fluid and was subsequently resulted in improvement in symptoms.  She does not have any acute hypoxia no respiratory distress with stable blood pressures I do believe she is appropriate for outpatient  follow-up if she is getting dialyzed in the morning.   [PR]    Clinical Course User Index [PR] Merlyn Lot, MD     As part of my medical decision making, I reviewed the following data within the [facility] notes reviewed and incorporated, Labs reviewed, notes from prior ED visits and [place] Controlled Substance Database   ____________________________________________   FINAL CLINICAL IMPRESSION(S) / ED DIAGNOSES  Final diagnoses:  Shortness of breath  ESRD (end stage renal disease) on dialysis [facility])  Chronic respiratory disease      NEW MEDICATIONS STARTED  DURING THIS VISIT:  Discharge Medication List as of 01/12/2018  9:58 PM       Note:  This document was prepared using Dragon voice recognition software and may include unintentional dictation errors.    Merlyn Lot, MD 01/12/18 5927    Merlyn Lot, MD 01/12/18 6394    Merlyn Lot, MD 01/12/18 [phone]

## 2018-01-12 NOTE — Telephone Encounter (Signed)
Pt called with complaints of shortness of breath x 1 week; she says that she had xrays done that said she has "spores"; the pt states that she has been on oxygen and using inhalers but neither have worked; she says that she has a non-productive dry, hacking cough for a week and bilateral foot swelling for the past 2 days; the pt states that she can not sleep or lay down; nurse triage   Recommendations made per protocol to include going to the ED now;she verbalizes understanding; pt normally sees Dr Tyler Aas, [facility]; will route to office for notification of this encounter.  Reason for Disposition . Patient sounds very sick or weak to the triager  Answer Assessment - Initial Assessment Questions 1. RESPIRATORY STATUS: "Describe your breathing?" (e.g., wheezing, shortness of breath, unable to speak, severe coughing)      Short of breath 2. ONSET: "When did this breathing problem begin?"      01/05/18 3. PATTERN "Does the difficult breathing come and go, or has it been constant since it started?"      constant 4. SEVERITY: "How bad is your breathing?" (e.g., mild, moderate, severe)    - MILD: No SOB at rest, mild SOB with walking, speaks normally in sentences, can lay down, no retractions, pulse < 100.    - MODERATE: SOB at rest, SOB with minimal exertion and prefers to sit, cannot lie down flat, speaks in phrases, mild retractions, audible wheezing, pulse 100-120.    - SEVERE: Very SOB at rest, speaks in single words, struggling to breathe, sitting hunched forward, retractions, pulse > 120      moderate 5. RECURRENT SYMPTOM: "Have you had difficulty breathing before?" If so, ask: "When was the last time?" and "What happened that time?"      Yes, hx COPD 6. CARDIAC HISTORY: "Do you have any history of heart disease?" (e.g., heart attack, angina, bypass surgery, angioplasty)      Heart attack 7. LUNG HISTORY: "Do you have any history of lung disease?"  (e.g., pulmonary embolus, asthma,  emphysema)     COPD 8. CAUSE: "What do you think is causing the breathing problem?"      unsure 9. OTHER SYMPTOMS: "Do you have any other symptoms? (e.g., dizziness, runny nose, cough, chest pain, fever)     Cough,chest pain with deep inspiration 10. PREGNANCY: "Is there any chance you are pregnant?" "When was your last menstrual period?"       no 11. TRAVEL: "Have you traveled out of the country in the last month?" (e.g., travel history, exposures)       no  Protocols used: BREATHING DIFFICULTY-A-AH

## 2018-01-13 DIAGNOSIS — N2581 Secondary hyperparathyroidism of renal origin: Secondary | ICD-10-CM | POA: Diagnosis not present

## 2018-01-13 DIAGNOSIS — Z992 Dependence on renal dialysis: Secondary | ICD-10-CM | POA: Diagnosis not present

## 2018-01-13 DIAGNOSIS — D509 Iron deficiency anemia, unspecified: Secondary | ICD-10-CM | POA: Diagnosis not present

## 2018-01-13 DIAGNOSIS — D631 Anemia in chronic kidney disease: Secondary | ICD-10-CM | POA: Diagnosis not present

## 2018-01-13 DIAGNOSIS — N186 End stage renal disease: Secondary | ICD-10-CM | POA: Diagnosis not present

## 2018-01-14 ENCOUNTER — Encounter (INDEPENDENT_AMBULATORY_CARE_PROVIDER_SITE_OTHER): Payer: Self-pay | Admitting: Vascular Surgery

## 2018-01-14 ENCOUNTER — Ambulatory Visit (INDEPENDENT_AMBULATORY_CARE_PROVIDER_SITE_OTHER): Payer: Medicare Other | Admitting: Vascular Surgery

## 2018-01-14 VITALS — BP 176/71 | HR 75 | Resp 16 | Ht 67.0 in | Wt 172.0 lb

## 2018-01-14 DIAGNOSIS — T829XXS Unspecified complication of cardiac and vascular prosthetic device, implant and graft, sequela: Secondary | ICD-10-CM

## 2018-01-14 DIAGNOSIS — I6523 Occlusion and stenosis of bilateral carotid arteries: Secondary | ICD-10-CM

## 2018-01-14 DIAGNOSIS — Z992 Dependence on renal dialysis: Secondary | ICD-10-CM

## 2018-01-14 DIAGNOSIS — I10 Essential (primary) hypertension: Secondary | ICD-10-CM

## 2018-01-14 DIAGNOSIS — I739 Peripheral vascular disease, unspecified: Secondary | ICD-10-CM | POA: Diagnosis not present

## 2018-01-14 DIAGNOSIS — N186 End stage renal disease: Secondary | ICD-10-CM | POA: Diagnosis not present

## 2018-01-14 DIAGNOSIS — I25118 Atherosclerotic heart disease of native coronary artery with other forms of angina pectoris: Secondary | ICD-10-CM

## 2018-01-15 DIAGNOSIS — N186 End stage renal disease: Secondary | ICD-10-CM | POA: Diagnosis not present

## 2018-01-15 DIAGNOSIS — D509 Iron deficiency anemia, unspecified: Secondary | ICD-10-CM | POA: Diagnosis not present

## 2018-01-15 DIAGNOSIS — Z992 Dependence on renal dialysis: Secondary | ICD-10-CM | POA: Diagnosis not present

## 2018-01-15 DIAGNOSIS — N2581 Secondary hyperparathyroidism of renal origin: Secondary | ICD-10-CM | POA: Diagnosis not present

## 2018-01-15 DIAGNOSIS — D631 Anemia in chronic kidney disease: Secondary | ICD-10-CM | POA: Diagnosis not present

## 2018-01-18 ENCOUNTER — Encounter: Admit: 2018-01-18 | Discharge: 2018-01-18 | Payer: MEDICARE | Attending: Surgery | Primary: Surgery

## 2018-01-18 DIAGNOSIS — N186 End stage renal disease: Secondary | ICD-10-CM | POA: Diagnosis not present

## 2018-01-18 DIAGNOSIS — D509 Iron deficiency anemia, unspecified: Secondary | ICD-10-CM | POA: Diagnosis not present

## 2018-01-18 DIAGNOSIS — Z992 Dependence on renal dialysis: Secondary | ICD-10-CM | POA: Diagnosis not present

## 2018-01-18 DIAGNOSIS — D631 Anemia in chronic kidney disease: Secondary | ICD-10-CM | POA: Diagnosis not present

## 2018-01-18 DIAGNOSIS — N2581 Secondary hyperparathyroidism of renal origin: Secondary | ICD-10-CM | POA: Diagnosis not present

## 2018-01-20 DIAGNOSIS — N2581 Secondary hyperparathyroidism of renal origin: Secondary | ICD-10-CM | POA: Diagnosis not present

## 2018-01-20 DIAGNOSIS — N186 End stage renal disease: Secondary | ICD-10-CM | POA: Diagnosis not present

## 2018-01-20 DIAGNOSIS — D509 Iron deficiency anemia, unspecified: Secondary | ICD-10-CM | POA: Diagnosis not present

## 2018-01-20 DIAGNOSIS — D631 Anemia in chronic kidney disease: Secondary | ICD-10-CM | POA: Diagnosis not present

## 2018-01-20 DIAGNOSIS — Z992 Dependence on renal dialysis: Secondary | ICD-10-CM | POA: Diagnosis not present

## 2018-01-22 DIAGNOSIS — Z01818 Encounter for other preprocedural examination: Secondary | ICD-10-CM

## 2018-01-22 DIAGNOSIS — Z7682 Awaiting organ transplant status: Principal | ICD-10-CM

## 2018-01-22 DIAGNOSIS — N186 End stage renal disease: Secondary | ICD-10-CM

## 2018-01-22 DIAGNOSIS — N2581 Secondary hyperparathyroidism of renal origin: Secondary | ICD-10-CM | POA: Diagnosis not present

## 2018-01-22 DIAGNOSIS — D631 Anemia in chronic kidney disease: Secondary | ICD-10-CM | POA: Diagnosis not present

## 2018-01-22 DIAGNOSIS — D509 Iron deficiency anemia, unspecified: Secondary | ICD-10-CM | POA: Diagnosis not present

## 2018-01-22 DIAGNOSIS — Z992 Dependence on renal dialysis: Secondary | ICD-10-CM | POA: Diagnosis not present

## 2018-01-25 DIAGNOSIS — N186 End stage renal disease: Secondary | ICD-10-CM | POA: Diagnosis not present

## 2018-01-25 DIAGNOSIS — D509 Iron deficiency anemia, unspecified: Secondary | ICD-10-CM | POA: Diagnosis not present

## 2018-01-25 DIAGNOSIS — D631 Anemia in chronic kidney disease: Secondary | ICD-10-CM | POA: Diagnosis not present

## 2018-01-25 DIAGNOSIS — Z992 Dependence on renal dialysis: Secondary | ICD-10-CM | POA: Diagnosis not present

## 2018-01-25 DIAGNOSIS — N2581 Secondary hyperparathyroidism of renal origin: Secondary | ICD-10-CM | POA: Diagnosis not present

## 2018-01-27 DIAGNOSIS — D509 Iron deficiency anemia, unspecified: Secondary | ICD-10-CM | POA: Diagnosis not present

## 2018-01-27 DIAGNOSIS — N186 End stage renal disease: Secondary | ICD-10-CM | POA: Diagnosis not present

## 2018-01-27 DIAGNOSIS — N2581 Secondary hyperparathyroidism of renal origin: Secondary | ICD-10-CM | POA: Diagnosis not present

## 2018-01-27 DIAGNOSIS — Z992 Dependence on renal dialysis: Secondary | ICD-10-CM | POA: Diagnosis not present

## 2018-01-27 DIAGNOSIS — D631 Anemia in chronic kidney disease: Secondary | ICD-10-CM | POA: Diagnosis not present

## 2018-01-28 ENCOUNTER — Encounter: Payer: Self-pay | Admitting: Podiatry

## 2018-01-28 ENCOUNTER — Ambulatory Visit (INDEPENDENT_AMBULATORY_CARE_PROVIDER_SITE_OTHER): Payer: Medicare Other | Admitting: Podiatry

## 2018-01-28 DIAGNOSIS — E1140 Type 2 diabetes mellitus with diabetic neuropathy, unspecified: Secondary | ICD-10-CM

## 2018-01-28 DIAGNOSIS — B351 Tinea unguium: Secondary | ICD-10-CM | POA: Diagnosis not present

## 2018-01-28 DIAGNOSIS — M79609 Pain in unspecified limb: Secondary | ICD-10-CM | POA: Diagnosis not present

## 2018-01-28 DIAGNOSIS — L608 Other nail disorders: Secondary | ICD-10-CM | POA: Diagnosis not present

## 2018-01-28 NOTE — Progress Notes (Signed)
Complaint:  Visit Type: Patient returns to my office for continued preventative foot care services. Complaint: Patient states" my nails have grown long and thick and become painful to walk and wear shoes" Patient has been diagnosed with DM with no foot complications. The patient presents for preventative foot care services. No changes to ROS  Podiatric Exam: Vascular: dorsalis pedis and posterior tibial pulses are palpable bilateral. Capillary return is immediate. Temperature gradient is WNL. Skin turgor WNL  Sensorium: Normal Semmes Weinstein monofilament test. Normal tactile sensation bilaterally. Nail Exam: Pt has thick disfigured discolored nails with subungual debris noted bilateral entire nail hallux through fifth toenails.  Pincer nails  B/L Ulcer Exam: There is no evidence of ulcer or pre-ulcerative changes or infection. Orthopedic Exam: Muscle tone and strength are WNL. No limitations in general ROM. No crepitus or effusions noted. Foot type and digits show no abnormalities. HAV  B/L Skin: No Porokeratosis. No infection or ulcers.  S/P ulcer healing dorsally both feet.  Diagnosis:  Onychomycosis, , Pain in right toe, pain in left toes  Treatment & Plan Procedures and Treatment: Consent by patient was obtained for treatment procedures. The patient understood the discussion of treatment and procedures well. All questions were answered thoroughly reviewed. Debridement of mycotic and hypertrophic toenails, 1 through 5 bilateral and clearing of subungual debris. No ulceration, no infection noted. ABN signed for 2019. Return Visit-Office Procedure: Patient instructed to return to the office for a follow up visit 3 months for continued evaluation and treatment.    Gardiner Barefoot DPM

## 2018-01-29 DIAGNOSIS — D509 Iron deficiency anemia, unspecified: Secondary | ICD-10-CM | POA: Diagnosis not present

## 2018-01-29 DIAGNOSIS — D631 Anemia in chronic kidney disease: Secondary | ICD-10-CM | POA: Diagnosis not present

## 2018-01-29 DIAGNOSIS — N2581 Secondary hyperparathyroidism of renal origin: Secondary | ICD-10-CM | POA: Diagnosis not present

## 2018-01-29 DIAGNOSIS — N186 End stage renal disease: Secondary | ICD-10-CM | POA: Diagnosis not present

## 2018-01-29 DIAGNOSIS — Z992 Dependence on renal dialysis: Secondary | ICD-10-CM | POA: Diagnosis not present

## 2018-01-30 ENCOUNTER — Encounter (INDEPENDENT_AMBULATORY_CARE_PROVIDER_SITE_OTHER): Payer: Self-pay | Admitting: Vascular Surgery

## 2018-01-30 NOTE — Progress Notes (Signed)
MRN : [id number]  Theresa [name] is a 62 y.o. (1956-02-15) female who presents with chief complaint of  Chief Complaint  Patient presents with  . Follow-up    3-4week   .  History of Present Illness:   The patient returns to the office for followup of their dialysis access. The function of the access has been stable. The patient denies increased bleeding time or increased recirculation. Patient denies difficulty with cannulation. The patient denies hand pain or other symptoms consistent with steal phenomena.  No significant arm swelling.  The patient denies redness or swelling at the access site. The patient denies fever or chills at home or while on dialysis.  The patient denies amaurosis fugax or recent TIA symptoms. There are no recent neurological changes noted. The patient denies claudication symptoms or rest pain symptoms. The patient denies history of DVT, PE or superficial thrombophlebitis. The patient denies recent episodes of angina or shortness of breath.   Duplex ultrasound of the AV access shows a patent access with uniform velocities.  No focal hemodynamically significant stenosis.      Current Meds  Medication Sig  . albuterol (PROVENTIL) (2.5 MG/3ML) 0.083% nebulizer solution Take 2.5 mg by nebulization every 6 (six) hours as needed for wheezing or shortness of breath.  Marland Kitchen albuterol (VENTOLIN HFA) 108 (90 Base) MCG/ACT inhaler Inhale into the lungs every 6 (six) hours as needed for wheezing or shortness of breath.  Marland Kitchen amLODipine (NORVASC) 10 MG tablet Take 10 mg by mouth daily.   Marland Kitchen aspirin EC 81 MG tablet Take 81 mg by mouth at bedtime.   Marland Kitchen atorvastatin (LIPITOR) 40 MG tablet Take 1 tablet (40 mg total) by mouth daily. At night  . AURYXIA 1 GM 210 MG(Fe) tablet Take 420-630 mg by mouth 3 (three) times daily with meals. Take 630 mg with each meal and 420 mg with each snack  . carvedilol (COREG) 12.5 MG tablet 1 pill bid  . cinacalcet (SENSIPAR) 60 MG tablet Take 60 mg  by mouth daily.  . cloNIDine (CATAPRES) 0.1 MG tablet Take 0.1 mg by mouth 2 (two) times daily.  . furosemide (LASIX) 80 MG tablet Take 80 mg by mouth See admin instructions. Take 80 mg by mouth once daily on Sun, Tue, Thur, and Sat (non dialysis days)  . isosorbide mononitrate (IMDUR) 60 MG 24 hr tablet Take 1 tablet (60 mg total) by mouth daily.  . multivitamin (RENA-VIT) TABS tablet Take 1 tablet by mouth daily.  . nitroGLYCERIN (NITROSTAT) 0.4 MG SL tablet Place 1 tablet (0.4 mg total) under the tongue every 5 (five) minutes as needed for chest pain.  Marland Kitchen ondansetron (ZOFRAN) 4 MG tablet Take 1 tablet (4 mg total) by mouth every 8 (eight) hours as needed for nausea or vomiting.  Marland Kitchen oxyCODONE-acetaminophen (PERCOCET/ROXICET) 5-325 MG tablet Take 1 tablet by mouth 2 (two) times daily as needed for pain.   . pregabalin (LYRICA) 75 MG capsule Take 1 capsule (75 mg total) by mouth daily.  Marland Kitchen umeclidinium-vilanterol (ANORO ELLIPTA) 62.5-25 MCG/INH AEPB Inhale 1 puff into the lungs daily.   Current Facility-Administered Medications for the 01/14/18 encounter (Office Visit) with Delana Meyer, Dolores Lory, MD  Medication  . betamethasone acetate-betamethasone sodium phosphate (CELESTONE) injection 3 mg    Past Medical History:  Diagnosis Date  . Anemia of chronic disease   . Carotid arterial disease ([place])    a. 02/2013 U/S: 40-59% bilat ICA stenosis - *f/u 02/2014*  . Chronic constipation   .  Chronic diastolic CHF (congestive heart failure) ([place])    a. 10/2013 Echo [facility]): EF 55-60%, mod conc LVH, mod MR, mildly dil LA, mild Ao sclerosis w/o stenosis.  . Colon polyps   . COPD (chronic obstructive pulmonary disease) ([place])   . Coronary artery disease    a. 05/2013 NSTEMI/PCI: LM 20d, LAD min irregs, LCX small, nl, OM1 nl, RCA dom 68m (2.5x16 Promus DES), PDA1 80p.  . Diabetes mellitus   . Diabetic neuropathy ([place])   . Diabetic retinopathy ([place]) 05/28/2013   Hx bilat retinal detachment, proliferative diab  retinopathy and bilat vitreous hemorrhage   . Emphysema   . ESRD on hemodialysis ([place])    a. [facility] in [place], [place] [phone]/Dr. [name], on a MWF schedule.  She started dialysis in Feb 2014.  Etiology of renal failure not known, likely diabetes.  Has a left upper arm AV graft.  . History of bronchitis    Mar 2012  . History of pneumonia    June 2012  . History of tobacco abuse    a. Quit 2012.  Marland Kitchen Hyperlipidemia   . Hypertension   . Moderate mitral insufficiency    a. 10/2013 Echo: EF 55-60%, mod MR.  . Myocardial infarct ([place]) 05/2013  . Peripheral vascular disease ([place])   . Renal insufficiency   . Sickle cell trait ([place])   . Thyroid nodule    [place] 06/2017 due to f/u US in 1 year     Past Surgical History:  Procedure Laterality Date  . A/V FISTULAGRAM Left 11/10/2017   Procedure: A/V FISTULAGRAM;  Surgeon: Katha Cabal, MD;  Location: [place] [facility];  Service: Cardiovascular;  Laterality: Left;  . ABDOMINAL HYSTERECTOMY     2000  . CARDIAC CATHETERIZATION    . CARDIAC CATHETERIZATION N/A 05/02/2015   Procedure: Left Heart Cath and Coronary Angiography;  Surgeon: [name], MD;  Location: [place] [facility];  Service: Cardiovascular;  Laterality: N/A;  . COLONOSCOPY WITH PROPOFOL N/A 07/08/2016   Procedure: COLONOSCOPY WITH PROPOFOL;  Surgeon: [name], MD;  Location: [facility] ENDOSCOPY;  Service: Endoscopy;  Laterality: N/A;  . COLONOSCOPY WITH PROPOFOL N/A 08/20/2017   Procedure: COLONOSCOPY WITH PROPOFOL;  Surgeon: [name], MD;  Location: [facility] ENDOSCOPY;  Service: Gastroenterology;  Laterality: N/A;  . colonscopy    . CORONARY ANGIOPLASTY  05/28/2014   stent placement to the mid RCA  . DILATION AND CURETTAGE OF UTERUS     several in the early 80's  . ESOPHAGOGASTRODUODENOSCOPY     2012  . EYE SURGERY     bilateral laser 2012  . EYE SURGERY     right  . EYE SURGERY     x4 both eyes  . GAS INSERTION  09/30/2011   Procedure: INSERTION OF GAS;   Surgeon: [name], MD;  Location: [place];  Service: Ophthalmology;  Laterality: Right;  [id number]  . GAS/FLUID EXCHANGE  09/30/2011   Procedure: GAS/FLUID EXCHANGE;  Surgeon: [name], MD;  Location: [place];  Service: Ophthalmology;  Laterality: Right;  . LEFT HEART CATHETERIZATION WITH CORONARY ANGIOGRAM N/A 05/28/2013   Procedure: LEFT HEART CATHETERIZATION WITH CORONARY ANGIOGRAM;  Surgeon: [name], MD;  Location: [facility] CATH LAB;  Service: Cardiovascular;  Laterality: N/A;  . PARS PLANA VITRECTOMY  04/22/2011   Procedure: PARS PLANA VITRECTOMY WITH 25 GAUGE;  Surgeon: [name], MD;  Location: [place];  Service: Ophthalmology;  Laterality: Left;  membrane peel, endolaser, gas fluid exchange,  silicone oil, repair of complex traction retinal detachment  . PARS PLANA VITRECTOMY  09/30/2011   Procedure: PARS PLANA VITRECTOMY WITH 25 GAUGE;  Surgeon: [name], MD;  Location: [place];  Service: Ophthalmology;  Laterality: Right;  Endolaser; Repair of Complex Traction Retinal Detachment  . PARS PLANA VITRECTOMY  02/24/2012   Procedure: PARS PLANA VITRECTOMY WITH 25 GAUGE;  Surgeon: [name], MD;  Location: [facility];  Service: Ophthalmology;  Laterality: Left;  . PTCA    . SILICON OIL REMOVAL  12/13/2374   Procedure: SILICON OIL REMOVAL;  Surgeon: [name], MD;  Location: [place];  Service: Ophthalmology;  Laterality: Left;  . THROMBECTOMY / ARTERIOVENOUS GRAFT REVISION    . TUBAL LIGATION     1979    Social History Social History   Tobacco Use  . Smoking status: Former Smoker    Packs/day: 1.00    Years: 25.00    Pack years: 25.00    Last attempt to quit: 06/09/2010    Years since quitting: 7.6  . Smokeless tobacco: Never Used  Substance Use Topics  . Alcohol use: No  . Drug use: No    Family History Family History  Problem Relation Age of Onset  . Stroke Mother   . Heart attack Mother   . Heart disease Mother   . Colon cancer Father   . Colon cancer  Sister   . Heart attack Brother   . Stroke Brother   . Diabetes Brother   . Breast cancer Sister   . Diabetes Brother   . Diabetes Daughter   . Anesthesia problems Neg Hx   . Hypotension Neg Hx   . Malignant hyperthermia Neg Hx   . Pseudochol deficiency Neg Hx     Allergies  Allergen Reactions  . Hydrocodone Hives  . Metformin Diarrhea and Other (See Comments)    Other reaction(s): Distress (finding)  . Lisinopril     Unknown reaction      REVIEW OF SYSTEMS (Negative unless checked)  Constitutional: [] Weight loss  [] Fever  [] Chills Cardiac: [] Chest pain   [] Chest pressure   [] Palpitations   [] Shortness of breath when laying flat   [] Shortness of breath with exertion. Vascular:  [x] Pain in legs with walking   [] Pain in legs at rest  [] History of DVT   [] Phlebitis   [x] Swelling in legs   [] Varicose veins   [] Non-healing ulcers Pulmonary:   [] Uses home oxygen   [] Productive cough   [] Hemoptysis   [] Wheeze  [] COPD   [] Asthma Neurologic:  [] Dizziness   [] Seizures   [] History of stroke   [] History of TIA  [] Aphasia   [] Vissual changes   [] Weakness or numbness in arm   [] Weakness or numbness in leg Musculoskeletal:   [] Joint swelling   [] Joint pain   [] Low back pain Hematologic:  [] Easy bruising  [] Easy bleeding   [] Hypercoagulable state   [] Anemic Gastrointestinal:  [] Diarrhea   [] Vomiting  [] Gastroesophageal reflux/heartburn   [] Difficulty swallowing. Genitourinary:  [x] Chronic kidney disease   [] Difficult urination  [] Frequent urination   [] Blood in urine Skin:  [] Rashes   [] Ulcers  Psychological:  [] History of anxiety   []  History of major depression.  Physical Examination  Vitals:   01/14/18 1625  BP: (!) 176/71  Pulse: 75  Resp: 16  Weight: 172 lb (78 kg)  Height: 5\' 7"  (1.702 m)   Body mass index is 26.94 kg/m. Gen: WD/WN, NAD Head: [place]/AT, No temporalis wasting.  Ear/Nose/Throat: Hearing grossly  intact, nares w/o erythema or drainage Eyes: PER, EOMI, sclera  nonicteric.  Neck: Supple, no large masses.   Pulmonary:  Good air movement, no audible wheezing bilaterally, no use of accessory muscles.  Cardiac: RRR, no JVD Vascular: Good thrill good bruit moderate pulsatility Vessel Right Left  Radial Palpable Palpable  Brachial Palpable Palpable  Carotid Palpable Palpable  Gastrointestinal: Non-distended. No guarding/no peritoneal signs.  Musculoskeletal: M/S 5/5 throughout.  No deformity or atrophy.  Neurologic: CN 2-12 intact. Symmetrical.  Speech is fluent. Motor exam as listed above. Psychiatric: Judgment intact, Mood & affect appropriate for pt's clinical situation. Dermatologic: No rashes or ulcers noted.  No changes consistent with cellulitis. Lymph : No lichenification or skin changes of chronic lymphedema.  CBC Lab Results  Component Value Date   WBC 10.6 01/12/2018   HGB 8.8 (L) 01/12/2018   HCT 25.3 (L) 01/12/2018   MCV 104.7 (H) 01/12/2018   PLT 212 01/12/2018    BMET    Component Value Date/Time   NA 141 01/12/2018 1638   NA 143 07/27/2015 1737   NA 137 05/14/2014 0741   K 3.9 01/12/2018 1638   K 3.3 (L) 05/14/2014 1500   CL 98 01/12/2018 1638   CL 98 05/14/2014 0741   CO2 34 (H) 01/12/2018 1638   CO2 35 (H) 05/14/2014 0741   GLUCOSE 142 (H) 01/12/2018 1638   GLUCOSE 96 05/14/2014 0741   BUN 18 01/12/2018 1638   BUN 25 (H) 05/14/2014 0741   CREATININE 6.77 (H) 01/12/2018 1638   CREATININE 5.83 (H) 05/14/2014 0741   CALCIUM 8.3 (L) 01/12/2018 1638   CALCIUM 8.9 05/14/2014 0741   GFRNONAA 6 (L) 01/12/2018 1638   GFRNONAA 8 (L) 05/14/2014 0741   GFRNONAA 4 (L) 02/15/2014 0837   GFRAA 7 (L) 01/12/2018 1638   GFRAA 10 (L) 05/14/2014 0741   GFRAA 5 (L) 02/15/2014 0837   Estimated Creatinine Clearance: 9.5 mL/min (A) (by C-G formula based on SCr of 6.77 mg/dL (H)).  COAG Lab Results  Component Value Date   INR 1.00 03/11/2017   INR 1.17 05/02/2015   INR 1.0 10/10/2013    Radiology Dg Chest 2 View  Result  Date: 01/05/2018 CLINICAL DATA:  Exertional shortness of breath with hypoxia and cough. History of COPD, diabetes, coronary artery disease, dialysis dependent renal failure. COPD, former smoker. EXAM: CHEST - 2 VIEW COMPARISON:  Chest x-ray of July 26, 2017 FINDINGS: The lungs are adequately inflated. The interstitial markings are coarse though stable. The small bilateral pleural effusions are less conspicuous today. The heart is top-normal in size. The central pulmonary vascularity is prominent but stable. There is calcification in the wall of the aortic arch. The bony thorax is unremarkable. IMPRESSION: Chronic bronchitic-smoking related changes. Stable low-grade compensated CHF. Decreased conspicuity of bilateral pleural effusions. Thoracic aortic atherosclerosis. Electronically Signed   By: David  Martinique M.D.   On: 01/05/2018 14:08   Ct Angio Chest Pe W And/or Wo Contrast  Result Date: 01/12/2018 CLINICAL DATA:  Shortness of breath. EXAM: CT ANGIOGRAPHY CHEST WITH CONTRAST TECHNIQUE: Multidetector CT imaging of the chest was performed using the standard protocol during bolus administration of intravenous contrast. Multiplanar CT image reconstructions and MIPs were obtained to evaluate the vascular anatomy. CONTRAST:  26mL ISOVUE-370 IOPAMIDOL (ISOVUE-370) INJECTION 76% COMPARISON:  Radiograph of same day.  CT scan of March 11, 2017. FINDINGS: Cardiovascular: Satisfactory opacification of the pulmonary arteries to the segmental level. No evidence of pulmonary embolism. Mild cardiomegaly is noted. No  pericardial effusion. Atherosclerosis of thoracic aorta is noted without aneurysm or dissection. Coronary artery calcifications are noted. Mediastinum/Nodes: No enlarged mediastinal, hilar, or axillary lymph nodes. Thyroid gland, trachea, and esophagus demonstrate no significant findings. Lungs/Pleura: Moderate bilateral pleural effusions are noted with adjacent subsegmental atelectasis. No pneumothorax is  noted. Upper Abdomen: No acute abnormality. Musculoskeletal: No chest wall abnormality. No acute or significant osseous findings. Review of the MIP images confirms the above findings. IMPRESSION: No definite evidence of pulmonary embolus. Moderate bilateral pleural effusions are noted with adjacent subsegmental atelectasis. Coronary artery calcifications are noted suggesting coronary artery disease. Aortic Atherosclerosis (ICD10-I70.0). Electronically Signed   By: Marijo Conception, M.D.   On: 01/12/2018 21:29   US Abdomen Complete  Result Date: 01/05/2018 CLINICAL DATA:  Elevated alkaline phosphatase level. Also nausea and vomiting for the past 3 weeks. History of dialysis dependent renal failure. EXAM: ABDOMEN ULTRASOUND COMPLETE COMPARISON:  None. FINDINGS: Gallbladder: No gallstones or wall thickening visualized. No sonographic Murphy sign noted by sonographer. Common bile duct: Diameter: 5.7 mm Liver: No focal lesion identified. Within normal limits in parenchymal echogenicity. Portal vein is patent on color Doppler imaging with normal direction of blood flow towards the liver. IVC: No abnormality visualized. Pancreas: Bowel gas limits evaluation of the pancreatic tail. The pancreatic head and body appear normal. Spleen: Size and appearance within normal limits. Right Kidney: Length: 8.1 cm. There is cortical thinning. The cortical echotexture is increased and approximates that of the adjacent liver. There is no hydronephrosis. Left Kidney: Length: 7.7 cm. There is cortical thinning. The cortical echotexture is increased and is similar to that on the right. There is no hydronephrosis. Abdominal aorta: No aneurysm visualized. The bifurcation was obscured by bowel gas. Other findings: There are bilateral pleural effusions. No ascites is observed. IMPRESSION: Normal appearance of the gallbladder, common bile duct, and liver. Limited visualization of the pancreatic tail. Bilateral renal atrophy with cortical  thinning and increased echotexture consistent with medical renal disease. No hydronephrosis. Bilateral pleural effusions. Electronically Signed   By: David  Martinique M.D.   On: 01/05/2018 14:49   US Venous Img Lower Bilateral  Result Date: 01/12/2018 CLINICAL DATA:  Acute bilateral lower extremity swelling. EXAM: BILATERAL LOWER EXTREMITY VENOUS DOPPLER ULTRASOUND TECHNIQUE: Gray-scale sonography with graded compression, as well as color Doppler and duplex ultrasound were performed to evaluate the lower extremity deep venous systems from the level of the common femoral vein and including the common femoral, femoral, profunda femoral, popliteal and calf veins including the posterior tibial, peroneal and gastrocnemius veins when visible. The superficial great saphenous vein was also interrogated. Spectral Doppler was utilized to evaluate flow at rest and with distal augmentation maneuvers in the common femoral, femoral and popliteal veins. COMPARISON:  None. FINDINGS: RIGHT LOWER EXTREMITY Common Femoral Vein: No evidence of thrombus. Normal compressibility, respiratory phasicity and response to augmentation. Saphenofemoral Junction: No evidence of thrombus. Normal compressibility and flow on color Doppler imaging. Profunda Femoral Vein: No evidence of thrombus. Normal compressibility and flow on color Doppler imaging. Femoral Vein: No evidence of thrombus. Normal compressibility, respiratory phasicity and response to augmentation. Popliteal Vein: No evidence of thrombus. Normal compressibility, respiratory phasicity and response to augmentation. Calf Veins: No evidence of thrombus. Normal compressibility and flow on color Doppler imaging. Venous Reflux:  None. Other Findings:  None. LEFT LOWER EXTREMITY Common Femoral Vein: No evidence of thrombus. Normal compressibility, respiratory phasicity and response to augmentation. Saphenofemoral Junction: No evidence of thrombus. Normal compressibility and flow on color  Doppler imaging. Profunda Femoral Vein: No evidence of thrombus. Normal compressibility and flow on color Doppler imaging. Femoral Vein: No evidence of thrombus. Normal compressibility, respiratory phasicity and response to augmentation. Popliteal Vein: No evidence of thrombus. Normal compressibility, respiratory phasicity and response to augmentation. Calf Veins: No evidence of thrombus. Normal compressibility and flow on color Doppler imaging. Venous Reflux:  None. Other Findings:  None. IMPRESSION: No evidence of deep venous thrombosis seen in either lower extremity. Electronically Signed   By: Marijo Conception, M.D.   On: 01/12/2018 20:44   Dg Chest Portable 1 View  Result Date: 01/12/2018 CLINICAL DATA:  Shortness of breath. EXAM: PORTABLE CHEST 1 VIEW COMPARISON:  01/05/2018. FINDINGS: Mild cardiomegaly. Bilateral pulmonary interstitial prominence and bilateral pleural effusions are noted consistent with CHF. No pneumothorax. No acute bony abnormality. IMPRESSION: Mild cardiomegaly. Bilateral pulmonary interstitial prominence and bilateral pleural effusions are noted. Findings consistent with CHF. Electronically Signed   By: Marcello Moores  Register   On: 01/12/2018 17:31     Assessment/Plan 1. Complication of vascular access for dialysis, sequela Recommend:  The patient is doing well and currently has adequate dialysis access. The patient's dialysis center is not reporting any access issues. Flow pattern is stable when compared to the prior ultrasound.  The patient should have a duplex ultrasound of the dialysis access in 6 months. The patient will follow-up with me in the office after each ultrasound    - VAS [place] (AVF, AVG); Future  2. ESRD on hemodialysis ([place]) Continue dialysis without interruption  3. Bilateral carotid artery stenosis Recommend:  Given the patient's asymptomatic subcritical stenosis no further invasive testing or surgery at this time.  Continue  antiplatelet therapy as prescribed Continue management of CAD, HTN and Hyperlipidemia Healthy heart diet,  encouraged exercise at least 4 times per week Follow up in 12 months with duplex ultrasound and physical exam   4. PAD (peripheral artery disease) (HCC)  Recommend:  The patient has evidence of atherosclerosis of the lower extremities with claudication.  The patient does not voice lifestyle limiting changes at this point in time.  Noninvasive studies do not suggest clinically significant change.  No invasive studies, angiography or surgery at this time The patient should continue walking and begin a more formal exercise program.  The patient should continue antiplatelet therapy and aggressive treatment of the lipid abnormalities  No changes in the patient's medications at this time  The patient should continue wearing graduated compression socks 10-15 mmHg strength to control the mild edema.    5. Essential (primary) hypertension Continue antihypertensive medications as already ordered, these medications have been reviewed and there are no changes at this time.   6. Coronary artery disease of native artery of native heart with stable angina pectoris (HCC) Continue cardiac and antihypertensive medications as already ordered and reviewed, no changes at this time.  Continue statin as ordered and reviewed, no changes at this time  Nitrates PRN for chest pain     Hortencia Pilar, MD  01/30/2018 3:28 PM

## 2018-02-01 DIAGNOSIS — N2581 Secondary hyperparathyroidism of renal origin: Secondary | ICD-10-CM | POA: Diagnosis not present

## 2018-02-01 DIAGNOSIS — N186 End stage renal disease: Secondary | ICD-10-CM | POA: Diagnosis not present

## 2018-02-01 DIAGNOSIS — Z992 Dependence on renal dialysis: Secondary | ICD-10-CM | POA: Diagnosis not present

## 2018-02-01 DIAGNOSIS — D631 Anemia in chronic kidney disease: Secondary | ICD-10-CM | POA: Diagnosis not present

## 2018-02-01 DIAGNOSIS — D509 Iron deficiency anemia, unspecified: Secondary | ICD-10-CM | POA: Diagnosis not present

## 2018-02-03 DIAGNOSIS — D509 Iron deficiency anemia, unspecified: Secondary | ICD-10-CM | POA: Diagnosis not present

## 2018-02-03 DIAGNOSIS — D631 Anemia in chronic kidney disease: Secondary | ICD-10-CM | POA: Diagnosis not present

## 2018-02-03 DIAGNOSIS — N2581 Secondary hyperparathyroidism of renal origin: Secondary | ICD-10-CM | POA: Diagnosis not present

## 2018-02-03 DIAGNOSIS — Z992 Dependence on renal dialysis: Secondary | ICD-10-CM | POA: Diagnosis not present

## 2018-02-03 DIAGNOSIS — N186 End stage renal disease: Secondary | ICD-10-CM | POA: Diagnosis not present

## 2018-02-05 DIAGNOSIS — Z992 Dependence on renal dialysis: Secondary | ICD-10-CM | POA: Diagnosis not present

## 2018-02-05 DIAGNOSIS — D631 Anemia in chronic kidney disease: Secondary | ICD-10-CM | POA: Diagnosis not present

## 2018-02-05 DIAGNOSIS — N2581 Secondary hyperparathyroidism of renal origin: Secondary | ICD-10-CM | POA: Diagnosis not present

## 2018-02-05 DIAGNOSIS — N186 End stage renal disease: Secondary | ICD-10-CM | POA: Diagnosis not present

## 2018-02-05 DIAGNOSIS — D509 Iron deficiency anemia, unspecified: Secondary | ICD-10-CM | POA: Diagnosis not present

## 2018-02-06 DIAGNOSIS — Z992 Dependence on renal dialysis: Secondary | ICD-10-CM | POA: Diagnosis not present

## 2018-02-06 DIAGNOSIS — N186 End stage renal disease: Secondary | ICD-10-CM | POA: Diagnosis not present

## 2018-02-07 DIAGNOSIS — N186 End stage renal disease: Secondary | ICD-10-CM | POA: Diagnosis not present

## 2018-02-07 DIAGNOSIS — D509 Iron deficiency anemia, unspecified: Secondary | ICD-10-CM | POA: Diagnosis not present

## 2018-02-07 DIAGNOSIS — Z992 Dependence on renal dialysis: Secondary | ICD-10-CM | POA: Diagnosis not present

## 2018-02-07 DIAGNOSIS — D631 Anemia in chronic kidney disease: Secondary | ICD-10-CM | POA: Diagnosis not present

## 2018-02-07 DIAGNOSIS — N2581 Secondary hyperparathyroidism of renal origin: Secondary | ICD-10-CM | POA: Diagnosis not present

## 2018-02-08 DIAGNOSIS — N186 End stage renal disease: Secondary | ICD-10-CM | POA: Diagnosis not present

## 2018-02-08 DIAGNOSIS — D631 Anemia in chronic kidney disease: Secondary | ICD-10-CM | POA: Diagnosis not present

## 2018-02-08 DIAGNOSIS — D509 Iron deficiency anemia, unspecified: Secondary | ICD-10-CM | POA: Diagnosis not present

## 2018-02-08 DIAGNOSIS — Z992 Dependence on renal dialysis: Secondary | ICD-10-CM | POA: Diagnosis not present

## 2018-02-08 DIAGNOSIS — N2581 Secondary hyperparathyroidism of renal origin: Secondary | ICD-10-CM | POA: Diagnosis not present

## 2018-02-09 ENCOUNTER — Encounter: Payer: Self-pay | Admitting: Internal Medicine

## 2018-02-09 ENCOUNTER — Ambulatory Visit (INDEPENDENT_AMBULATORY_CARE_PROVIDER_SITE_OTHER): Payer: Medicare Other | Admitting: Internal Medicine

## 2018-02-09 VITALS — BP 138/64 | HR 75 | Temp 98.2°F | Ht 67.0 in | Wt 166.6 lb

## 2018-02-09 DIAGNOSIS — Z23 Encounter for immunization: Secondary | ICD-10-CM

## 2018-02-09 DIAGNOSIS — I25118 Atherosclerotic heart disease of native coronary artery with other forms of angina pectoris: Secondary | ICD-10-CM | POA: Diagnosis not present

## 2018-02-09 DIAGNOSIS — J90 Pleural effusion, not elsewhere classified: Secondary | ICD-10-CM

## 2018-02-09 DIAGNOSIS — R112 Nausea with vomiting, unspecified: Secondary | ICD-10-CM | POA: Diagnosis not present

## 2018-02-09 NOTE — Patient Instructions (Addendum)
Have labs with dialysis and fax to me   Nausea, Adult Nausea is the feeling of an upset stomach or having to vomit. Nausea on its own is not usually a serious concern, but it may be an early sign of a more serious medical problem. As nausea gets worse, it can lead to vomiting. If vomiting develops, or if you are not able to drink enough fluids, you are at risk of becoming dehydrated. Dehydration can make you tired and thirsty, cause you to have a dry mouth, and decrease how often you urinate. Older adults and people with other diseases or a weak immune system are at higher risk for dehydration. The main goals of treating your nausea are:  To limit repeated nausea episodes.  To prevent vomiting and dehydration.  Follow these instructions at home: Follow instructions from your health care provider about how to care for yourself at home. Eating and drinking Follow these recommendations as told by your health care provider:  Take an oral rehydration solution (ORS). This is a drink that is sold at pharmacies and retail stores.  Drink clear fluids in small amounts as you are able. Clear fluids include water, ice chips, diluted fruit juice, and low-calorie sports drinks.  Eat bland, easy-to-digest foods in small amounts as you are able. These foods include bananas, applesauce, rice, lean meats, toast, and crackers.  Avoid drinking fluids that contain a lot of sugar or caffeine, such as energy drinks, sports drinks, and soda.  Avoid alcohol.  Avoid spicy or fatty foods.  General instructions  Drink enough fluid to keep your urine clear or pale yellow.  Wash your hands often. If soap and water are not available, use hand sanitizer.  Make sure that all people in your household wash their hands well and often.  Rest at home while you recover.  Take over-the-counter and prescription medicines only as told by your health care provider.  Breathe slowly and deeply when you feel  nauseous.  Watch your condition for any changes.  Keep all follow-up visits as told by your health care provider. This is important. Contact a health care provider if:  You have a headache.  You have new symptoms.  Your nausea gets worse.  You have a fever.  You feel light-headed or dizzy.  You vomit.  You cannot keep fluids down. Get help right away if:  You have pain in your chest, neck, arm, or jaw.  You feel extremely weak or you faint.  You have vomit that is bright red or looks like coffee grounds.  You have bloody or black stools or stools that look like tar.  You have a severe headache, a stiff neck, or both.  You have severe pain, cramping, or bloating in your abdomen.  You have a rash.  You have difficulty breathing or are breathing very quickly.  Your heart is beating very quickly.  Your skin feels cold and clammy.  You feel confused.  You have pain when you urinate.  You have signs of dehydration, such as: ? Dark urine, very little, or no urine. ? Cracked lips. ? Dry mouth. ? Sunken eyes. ? Sleepiness. ? Weakness. These symptoms may represent a serious problem that is an emergency. Do not wait to see if the symptoms will go away. Get medical help right away. Call your local emergency services (911 in the [place] Do not drive yourself to the hospital. This information is not intended to replace advice given to you by your health  care provider. Make sure you discuss any questions you have with your health care provider. Document Released: 07/03/2004 Document Revised: 10/29/2015 Document Reviewed: 01/30/2015 Elsevier Interactive Patient Education  [employer].

## 2018-02-09 NOTE — Progress Notes (Signed)
Pre visit review using our clinic review tool, if applicable. No additional management support is needed unless otherwise documented below in the visit note. 

## 2018-02-09 NOTE — Progress Notes (Signed)
Chief Complaint  Patient presents with  . Follow-up   F/u  1. Sob doing well mod b/l pleural effusions noted CT chest 01/12/18 with aortic atherosclerosis and CAD cards appt 9/10 2. Handicap paper filled out due to chronic low back pain  3. ESRD on HD  4. Nausea still persistent zofran helps EGD appt 9/17 with Dr. Vicente Males   Review of Systems  Constitutional: Positive for weight loss.  HENT: Negative for hearing loss.   Respiratory: Negative for shortness of breath.   Cardiovascular: Negative for chest pain.  Musculoskeletal: Positive for back pain.  Skin: Negative for rash.  Psychiatric/Behavioral: Negative for depression.   Past Medical History:  Diagnosis Date  . Anemia of chronic disease   . Carotid arterial disease ([place])    a. 02/2013 U/S: 40-59% bilat ICA stenosis - *f/u 02/2014*  . Chronic constipation   . Chronic diastolic CHF (congestive heart failure) ([place])    a. 10/2013 Echo [facility]): EF 55-60%, mod conc LVH, mod MR, mildly dil LA, mild Ao sclerosis w/o stenosis.  . Colon polyps   . COPD (chronic obstructive pulmonary disease) ([place])   . Coronary artery disease    a. 05/2013 NSTEMI/PCI: LM 20d, LAD min irregs, LCX small, nl, OM1 nl, RCA dom 38m(2.5x16 Promus DES), PDA1 80p.  . Diabetes mellitus   . Diabetic neuropathy ([place]   . Diabetic retinopathy ([place] 05/28/2013   Hx bilat retinal detachment, proliferative diab retinopathy and bilat vitreous hemorrhage   . Emphysema   . ESRD on hemodialysis ([place]    a. [facility] [phone]) [phone]/Dr. [name], on a MWF schedule.  She started dialysis in Feb 2014.  Etiology of renal failure not known, likely diabetes.  Has a left upper arm AV graft.  . History of bronchitis    Mar 2012  . History of pneumonia    June 2012  . History of tobacco abuse    a. Quit 2012.  .Marland KitchenHyperlipidemia   . Hypertension   . Moderate mitral insufficiency    a. 10/2013 Echo: EF 55-60%, mod MR.  . Myocardial infarct ([place] 05/2013  . Peripheral  vascular disease ([place]   . Renal insufficiency   . Sickle cell trait ([place]   . Thyroid nodule    [place]/2019 due to f/u [place] year    Past Surgical History:  Procedure Laterality Date  . A/V FISTULAGRAM Left 11/10/2017   Procedure: A/V FISTULAGRAM;  Surgeon: SKatha Cabal MD;  [facility];  Service: Cardiovascular;  Laterality: Left;  . ABDOMINAL HYSTERECTOMY     2000  . CARDIAC CATHETERIZATION    . CARDIAC CATHETERIZATION N/A 05/02/2015   Procedure: Left Heart Cath and Coronary Angiography;  Surgeon: MWellington Hampshire MD;  [facility]: [facility];  Service: Cardiovascular;  Laterality: N/A;  . COLONOSCOPY WITH PROPOFOL N/A 07/08/2016   Procedure: COLONOSCOPY WITH PROPOFOL;  Surgeon: KJonathon Bellows MD;  Location: [facility] ENDOSCOPY;  Service: Endoscopy;  Laterality: N/A;  . COLONOSCOPY WITH PROPOFOL N/A 08/20/2017   Procedure: COLONOSCOPY WITH PROPOFOL;  Surgeon: AJonathon Bellows MD;  [facility];  Service: Gastroenterology;  Laterality: N/A;  . colonscopy    . CORONARY ANGIOPLASTY  05/28/2014   stent placement to the mid RCA  . DILATION AND CURETTAGE OF UTERUS     several in the early 80's  . ESOPHAGOGASTRODUODENOSCOPY     2012  . EYE SURGERY     bilateral laser 2012  . EYE  SURGERY     right  . EYE SURGERY     x4 both eyes  . GAS INSERTION  09/30/2011   Procedure: INSERTION OF GAS;  Surgeon: [name], MD;  Location: [place];  Service: Ophthalmology;  Laterality: Right;  C3F8  . GAS/FLUID EXCHANGE  09/30/2011   Procedure: GAS/FLUID EXCHANGE;  Surgeon: [name], MD;  Location: [place];  Service: Ophthalmology;  Laterality: Right;  . LEFT HEART CATHETERIZATION WITH CORONARY ANGIOGRAM N/A 05/28/2013   Procedure: LEFT HEART CATHETERIZATION WITH CORONARY ANGIOGRAM;  Surgeon: [name], MD;  Location: [facility] CATH LAB;  Service: Cardiovascular;  Laterality: N/A;  . PARS PLANA VITRECTOMY  04/22/2011   Procedure: PARS PLANA VITRECTOMY WITH 25  GAUGE;  Surgeon: [name], MD;  Location: [place];  Service: Ophthalmology;  Laterality: Left;  membrane peel, endolaser, gas fluid exchange, silicone oil, repair of complex traction retinal detachment  . PARS PLANA VITRECTOMY  09/30/2011   Procedure: PARS PLANA VITRECTOMY WITH 25 GAUGE;  Surgeon: [name], MD;  Location: [place];  Service: Ophthalmology;  Laterality: Right;  Endolaser; Repair of Complex Traction Retinal Detachment  . PARS PLANA VITRECTOMY  02/24/2012   Procedure: PARS PLANA VITRECTOMY WITH 25 GAUGE;  Surgeon: [name], MD;  Location: [place];  Service: Ophthalmology;  Laterality: Left;  . PTCA    . SILICON OIL REMOVAL  7/61/6073   Procedure: SILICON OIL REMOVAL;  Surgeon: [name], MD;  Location: [place];  Service: Ophthalmology;  Laterality: Left;  . THROMBECTOMY / ARTERIOVENOUS GRAFT REVISION    . TUBAL LIGATION     1979   Family History  Problem Relation Age of Onset  . Stroke Mother   . Heart attack Mother   . Heart disease Mother   . Colon cancer Father   . Colon cancer Sister   . Heart attack Brother   . Stroke Brother   . Diabetes Brother   . Breast cancer Sister   . Diabetes Brother   . Diabetes Daughter   . Anesthesia problems Neg Hx   . Hypotension Neg Hx   . Malignant hyperthermia Neg Hx   . Pseudochol deficiency Neg Hx    Social History   Socioeconomic History  . Marital status: Single    Spouse name: Not on file  . Number of children: 2  . Years of education: Not on file  . Highest education level: Not on file  Occupational History  . Not on file  Social Needs  . Financial resource strain: Not on file  . Food insecurity:    Worry: Not on file    Inability: Not on file  . Transportation needs:    Medical: Not on file    Non-medical: Not on file  Tobacco Use  . Smoking status: Former Smoker    Packs/day: 1.00    Years: 25.00    Pack years: 25.00    Last attempt to quit: 06/09/2010    Years since quitting: 7.6  .  Smokeless tobacco: Never Used  Substance and Sexual Activity  . Alcohol use: No  . Drug use: No  . Sexual activity: Never  Lifestyle  . Physical activity:    Days per week: Not on file    Minutes per session: Not on file  . Stress: Not on file  Relationships  . Social connections:    Talks on phone: Not on file    Gets together: Not on file  Attends religious service: Not on file    Active member of club or organization: Not on file    Attends meetings of clubs or organizations: Not on file    Relationship status: Not on file  . Intimate partner violence:    Fear of current or ex partner: Not on file    Emotionally abused: Not on file    Physically abused: Not on file    Forced sexual activity: Not on file  Other Topics Concern  . Not on file  Social History Narrative   On disability.    Lives with son Brenton Grills   2 children (has son and daughter)      Son drives her since her vision has decreased   Current Meds  Medication Sig  . albuterol (PROVENTIL) (2.5 MG/3ML) 0.083% nebulizer solution Take 2.5 mg by nebulization every 6 (six) hours as needed for wheezing or shortness of breath.  Marland Kitchen albuterol (VENTOLIN HFA) 108 (90 Base) MCG/ACT inhaler Inhale into the lungs every 6 (six) hours as needed for wheezing or shortness of breath.  Marland Kitchen amLODipine (NORVASC) 10 MG tablet Take 10 mg by mouth daily.   Marland Kitchen aspirin EC 81 MG tablet Take 81 mg by mouth at bedtime.   Marland Kitchen atorvastatin (LIPITOR) 40 MG tablet Take 1 tablet (40 mg total) by mouth daily. At night  . AURYXIA 1 GM 210 MG(Fe) tablet Take 420-630 mg by mouth 3 (three) times daily with meals. Take 630 mg with each meal and 420 mg with each snack  . carvedilol (COREG) 12.5 MG tablet 1 pill bid  . cinacalcet (SENSIPAR) 60 MG tablet Take 60 mg by mouth daily.  . cloNIDine (CATAPRES) 0.1 MG tablet Take 0.1 mg by mouth 2 (two) times daily.  . furosemide (LASIX) 80 MG tablet Take 80 mg by mouth See admin instructions. Take 80 mg by mouth  once daily on Sun, Tue, Thur, and Sat (non dialysis days)  . isosorbide mononitrate (IMDUR) 60 MG 24 hr tablet Take 1 tablet (60 mg total) by mouth daily.  Marland Kitchen losartan (COZAAR) 50 MG tablet Take by mouth.  . meloxicam (MOBIC) 7.5 MG tablet meloxicam 7.5 mg tablet  . multivitamin (RENA-VIT) TABS tablet Take 1 tablet by mouth daily.  . nitroGLYCERIN (NITROSTAT) 0.4 MG SL tablet Place 1 tablet (0.4 mg total) under the tongue every 5 (five) minutes as needed for chest pain.  Marland Kitchen ondansetron (ZOFRAN) 4 MG tablet Take 1 tablet (4 mg total) by mouth every 8 (eight) hours as needed for nausea or vomiting.  Marland Kitchen oxyCODONE-acetaminophen (PERCOCET/ROXICET) 5-325 MG tablet Take 1 tablet by mouth 2 (two) times daily as needed for pain.   . pregabalin (LYRICA) 75 MG capsule Take 1 capsule (75 mg total) by mouth daily.  . rosuvastatin (CRESTOR) 40 MG tablet rosuvastatin 40 mg tablet  . umeclidinium-vilanterol (ANORO ELLIPTA) 62.5-25 MCG/INH AEPB Inhale 1 puff into the lungs daily.   Current Facility-Administered Medications for the 02/09/18 encounter (Office Visit) with McLean-Scocuzza, Nino Glow, MD  Medication  . betamethasone acetate-betamethasone sodium phosphate (CELESTONE) injection 3 mg   Allergies  Allergen Reactions  . Hydrocodone Hives  . Metformin Diarrhea and Other (See Comments)    Other reaction(s): Distress (finding)  . Lisinopril     Unknown reaction    Recent Results (from the past 2160 hour(s))  POCT occult blood stool     Status: Normal   Collection Time: 01/05/18 11:00 AM  Result Value Ref Range   Fecal Occult Blood, POC  Negative   Card #1 Date     Card #2 Fecal Occult Blod, POC     Card #2 Date     Card #3 Fecal Occult Blood, POC     Card #3 Date    CBC with Differential/Platelet     Status: Abnormal   Collection Time: 01/12/18  4:38 PM  Result Value Ref Range   WBC 10.6 3.6 - 11.0 K/uL   RBC 2.41 (L) 3.80 - 5.20 MIL/uL   Hemoglobin 8.8 (L) 12.0 - 16.0 g/dL   HCT 25.3 (L) 35.0 -  47.0 %   MCV 104.7 (H) 80.0 - 100.0 fL   MCH 36.6 (H) 26.0 - 34.0 pg   MCHC 34.9 32.0 - 36.0 g/dL   RDW 19.7 (H) 11.5 - 14.5 %   Platelets 212 150 - 440 K/uL   Neutrophils Relative % 80 %   Neutro Abs 8.6 (H) 1.4 - 6.5 K/uL   Lymphocytes Relative 12 %   Lymphs Abs 1.2 1.0 - 3.6 K/uL   Monocytes Relative 4 %   Monocytes Absolute 0.4 0.2 - 0.9 K/uL   Eosinophils Relative 3 %   Eosinophils Absolute 0.3 0 - 0.7 K/uL   Basophils Relative 1 %   Basophils Absolute 0.1 0 - 0.1 K/uL    Comment: Performed at [facility], [address] [place], [place] [address]  Comprehensive metabolic panel     Status: Abnormal   Collection Time: 01/12/18  4:38 PM  Result Value Ref Range   Sodium 141 135 - 145 mmol/L   Potassium 3.9 3.5 - 5.1 mmol/L    Comment: HEMOLYSIS AT THIS LEVEL MAY AFFECT RESULT   Chloride 98 98 - 111 mmol/L   CO2 34 (H) 22 - 32 mmol/L   Glucose, Bld 142 (H) 70 - 99 mg/dL   BUN 18 6 - 20 mg/dL   Creatinine, Ser 6.77 (H) 0.44 - 1.00 mg/dL   Calcium 8.3 (L) 8.9 - 10.3 mg/dL   Total Protein 6.3 (L) 6.5 - 8.1 g/dL   Albumin 3.5 3.5 - 5.0 g/dL   AST 33 15 - 41 U/L   ALT 14 0 - 44 U/L   Alkaline Phosphatase 150 (H) 38 - 126 U/L   Total Bilirubin 1.0 0.3 - 1.2 mg/dL   GFR calc non Af Amer 6 (L) >60 mL/min   GFR calc Af Amer 7 (L) >60 mL/min    Comment: (NOTE) The eGFR has been calculated using the CKD EPI equation. This calculation has not been validated in all clinical situations. eGFR's persistently <60 mL/min signify possible Chronic Kidney Disease.    Anion gap 9 5 - 15    Comment: Performed at [facility], [address] [place], [place] [address]  Troponin I     Status: Abnormal   Collection Time: 01/12/18  4:38 PM  Result Value Ref Range   Troponin I 0.03 (HH) <0.03 ng/mL    Comment: CRITICAL RESULT CALLED TO, READ BACK BY AND VERIFIED WITH CASSIE [facility] '@[id number]'  01/12/18 AKT Performed at [facility], [address] [place], [place]  [address]   Blood gas, venous     Status: Abnormal   Collection Time: 01/12/18  6:00 PM  Result Value Ref Range   pH, Ven 7.51 (H) 7.250 - 7.430   pCO2, Ven 44 44.0 - 60.0 mmHg   pO2, Ven 60.0 (H) 32.0 - 45.0 mmHg   Bicarbonate 35.1 (H) 20.0 - 28.0 mmol/L   Acid-Base  Excess 11.0 (H) 0.0 - 2.0 mmol/L   O2 Saturation 93.0 %   Patient temperature 37.0    Collection site VEIN    Sample type VEIN     Comment: Performed at [facility], [address] [place], [place] [address]   Objective  Body mass index is 26.09 kg/m. Wt Readings from Last 3 Encounters:  02/09/18 166 lb 9.6 oz (75.6 kg)  01/14/18 172 lb (78 kg)  01/12/18 170 lb (77.1 kg)   Temp Readings from Last 3 Encounters:  02/09/18 98.2 F (36.8 C) (Oral)  01/12/18 98.6 F (37 C) (Oral)  01/05/18 98.5 F (36.9 C) (Oral)   BP Readings from Last 3 Encounters:  02/09/18 138/64  01/14/18 (!) 176/71  01/12/18 (!) 169/72   Pulse Readings from Last 3 Encounters:  02/09/18 75  01/14/18 75  01/12/18 79    Physical Exam  Constitutional: She is oriented to person, place, and time. Vital signs are normal. She appears well-developed and well-nourished. She is cooperative.  HENT:  Head: Normocephalic and atraumatic.  Mouth/Throat: Oropharynx is clear and moist and mucous membranes are normal.  Eyes: Pupils are equal, round, and reactive to light. Conjunctivae are normal.  Cardiovascular: Normal rate, regular rhythm and normal heart sounds.  Pulmonary/Chest: Effort normal and breath sounds normal.  Neurological: She is alert and oriented to person, place, and time. Gait normal.  Skin: Skin is warm, dry and intact.  Psychiatric: She has a normal mood and affect. Her speech is normal and behavior is normal. Judgment and thought content normal. Cognition and memory are normal.  Nursing note and vitals reviewed.   Assessment   1. Sob likely multifactorial 2/2 mod pleural effusions h/o COPD 2. esrd on hd with  anemia 3. Cad/atherosclerosis s/p stent  4. Nausea  5. HM Plan   1. Monitor feeling better  Cont inhalers  2. F/u renal  3. Check labs with HD lipid, anemia panel, CBC, TSH  Cont lipitor 40 and asprin  4. Prn zofran f/u GI 02/23/18  [place] neg etiology abdomen pending EGD  5.   Had flu shot today  pna 23 had 06/09/10 consider repeat if has not had with HD at f/u  Tetanus UTD  Hep B immune  Consider shingrix in future   Pap neg 11/13/16 neg HPVs/p hysterectomy mammo neg 8/7/18pending 02/11/18  h/o multiple polypscolonoscopy 08/20/17 repeat in 3 years [facility] -pending EGD  Former smoker x 25-30 years <1 ppd  Does not make urine  Last A1C 4.8 12/14/17 with HD prior to that10/2018 5.6 per HD RN  Filled out handicap form today   Provider: Dr. Olivia Mackie McLean-Scocuzza-Internal Medicine

## 2018-02-10 DIAGNOSIS — N2581 Secondary hyperparathyroidism of renal origin: Secondary | ICD-10-CM | POA: Diagnosis not present

## 2018-02-10 DIAGNOSIS — D509 Iron deficiency anemia, unspecified: Secondary | ICD-10-CM | POA: Diagnosis not present

## 2018-02-10 DIAGNOSIS — N186 End stage renal disease: Secondary | ICD-10-CM | POA: Diagnosis not present

## 2018-02-10 DIAGNOSIS — D631 Anemia in chronic kidney disease: Secondary | ICD-10-CM | POA: Diagnosis not present

## 2018-02-10 DIAGNOSIS — Z992 Dependence on renal dialysis: Secondary | ICD-10-CM | POA: Diagnosis not present

## 2018-02-11 ENCOUNTER — Telehealth: Payer: Self-pay | Admitting: Internal Medicine

## 2018-02-11 NOTE — Telephone Encounter (Signed)
Mucinex DM green label or robitussin DM  As needed Tylenol  Zyrtec 5 mg at night for runny nose  Warm tea with honey and lemon   If feeling worse make appt to be seen asap flu is active now   Thanks tMS

## 2018-02-11 NOTE — Telephone Encounter (Signed)
Please advise 

## 2018-02-11 NOTE — Telephone Encounter (Signed)
Copied from [place] [id number]. Topic: Quick Communication - See Telephone Encounter >> Feb 11, 2018 11:50 AM Neva Seat wrote: Pt just saw Dr. Aundra Dubin on Tues  - pt now feels bad and wants to know if the doctor can advised her on what to take or do to get better.   Coughing is painful - head hurts - runny nose

## 2018-02-12 DIAGNOSIS — D631 Anemia in chronic kidney disease: Secondary | ICD-10-CM | POA: Diagnosis not present

## 2018-02-12 DIAGNOSIS — Z992 Dependence on renal dialysis: Secondary | ICD-10-CM | POA: Diagnosis not present

## 2018-02-12 DIAGNOSIS — D509 Iron deficiency anemia, unspecified: Secondary | ICD-10-CM | POA: Diagnosis not present

## 2018-02-12 DIAGNOSIS — N2581 Secondary hyperparathyroidism of renal origin: Secondary | ICD-10-CM | POA: Diagnosis not present

## 2018-02-12 DIAGNOSIS — N186 End stage renal disease: Secondary | ICD-10-CM | POA: Diagnosis not present

## 2018-02-12 NOTE — Telephone Encounter (Signed)
Patient called, left VM to return call to the office for a message from Dr. Terese Door below.

## 2018-02-12 NOTE — Telephone Encounter (Signed)
Left message for patient to return call back. PEC may give information from PCP.

## 2018-02-15 DIAGNOSIS — D631 Anemia in chronic kidney disease: Secondary | ICD-10-CM | POA: Diagnosis not present

## 2018-02-15 DIAGNOSIS — N2581 Secondary hyperparathyroidism of renal origin: Secondary | ICD-10-CM | POA: Diagnosis not present

## 2018-02-15 DIAGNOSIS — D509 Iron deficiency anemia, unspecified: Secondary | ICD-10-CM | POA: Diagnosis not present

## 2018-02-15 DIAGNOSIS — N186 End stage renal disease: Secondary | ICD-10-CM | POA: Diagnosis not present

## 2018-02-15 DIAGNOSIS — Z992 Dependence on renal dialysis: Secondary | ICD-10-CM | POA: Diagnosis not present

## 2018-02-16 ENCOUNTER — Encounter

## 2018-02-16 ENCOUNTER — Encounter: Payer: Self-pay | Admitting: Nurse Practitioner

## 2018-02-16 ENCOUNTER — Ambulatory Visit (INDEPENDENT_AMBULATORY_CARE_PROVIDER_SITE_OTHER): Payer: Medicare Other | Admitting: Nurse Practitioner

## 2018-02-16 VITALS — BP 178/66 | HR 72 | Ht 67.5 in | Wt 165.5 lb

## 2018-02-16 DIAGNOSIS — I340 Nonrheumatic mitral (valve) insufficiency: Secondary | ICD-10-CM | POA: Diagnosis not present

## 2018-02-16 DIAGNOSIS — E782 Mixed hyperlipidemia: Secondary | ICD-10-CM

## 2018-02-16 DIAGNOSIS — I5032 Chronic diastolic (congestive) heart failure: Secondary | ICD-10-CM

## 2018-02-16 DIAGNOSIS — I6523 Occlusion and stenosis of bilateral carotid arteries: Secondary | ICD-10-CM | POA: Diagnosis not present

## 2018-02-16 DIAGNOSIS — I10 Essential (primary) hypertension: Secondary | ICD-10-CM

## 2018-02-16 DIAGNOSIS — N186 End stage renal disease: Secondary | ICD-10-CM | POA: Diagnosis not present

## 2018-02-16 DIAGNOSIS — I2510 Atherosclerotic heart disease of native coronary artery without angina pectoris: Secondary | ICD-10-CM

## 2018-02-16 MED ORDER — CARVEDILOL 12.5 MG PO TABS: ORAL_TABLET | ORAL | 3 refills | Status: DC

## 2018-02-16 NOTE — Patient Instructions (Signed)
Medication Instructions: -  Your physician has recommended you make the following change in your medication:   1) INCREASE coreg (carvedilol) 12.5 mg- take 1.5 tablets (18.75 mg) by mouth twice daily   Labwork: - none ordered  Procedures/Testing: - none ordered  Follow-Up: - Your physician wants you to follow-up in: 6 months with Dr. Rockey Situ. You will receive a reminder letter in the mail/ call two months in advance. If you don't receive a letter/ call, please call our office to schedule the follow-up appointment.   Any Additional Special Instructions Will Be Listed Below (If Applicable).     If you need a refill on your cardiac medications before your next appointment, please call your pharmacy.

## 2018-02-16 NOTE — Progress Notes (Signed)
Office Visit    Patient Name: Theresa [name] Date of Encounter: 02/16/2018  Primary Care Provider:  McLean-Scocuzza, Nino Glow, MD Primary Cardiologist:  Ida Rogue, MD  Chief Complaint    62 y/o ? with a history of CAD status post RCA stenting, hypertension, hyperlipidemia, end-stage renal disease on dialysis, remote tobacco abuse, COPD, peripheral vascular disease, carotid arterial disease, diabetes, and sickle cell trait, who presents for follow-up related to moderate mitral regurgitation.  Past Medical History    Past Medical History:  Diagnosis Date  . Anemia of chronic disease   . Carotid arterial disease ([place])    a. 02/2013 U/S: 40-59% bilat ICA stenosis.  . Chronic constipation   . Chronic diastolic CHF (congestive heart failure) ([place])    a. 10/2013 Echo [facility]): EF 55-60%, mod conc LVH, mod MR, mildly dil LA, mild Ao sclerosis w/o stenosis; b. 02/2016 Echo: EF 55-60%, no rwma, Gr DD, mild AS, mod to sev MR, mildly dil LA, PASP 66mmHg; c. 07/2017 Echo [facility]): EF >55%, mild to mod LVH, Gr2 DD, Ao scl, Mod MR, mod dil LA, nl RV fxn.  . Colon polyps   . COPD (chronic obstructive pulmonary disease) ([place])   . Coronary artery disease    a. 05/2013 NSTEMI/PCI: LM 20d, LAD min irregs, LCX small, nl, OM1 nl, RCA dom 59m (2.5x16 Promus DES), PDA1 80p; b. 04/2015 Cath: LM nl, LAD 71m, D1/2 min irregs, LCX 35p/m, OM2/3 min irregs, RCA patent mid stent, RPDA 70ost, RPLB1 30, RPLB2/3 min irregs, EF 55-65%--> Med Rx; c. 06/2017 [facility]): No ischemia/infarct, EF 64%.  . Diabetes mellitus   . Diabetic neuropathy ([place])   . Diabetic retinopathy ([place]) 05/28/2013   Hx bilat retinal detachment, proliferative diab retinopathy and bilat vitreous hemorrhage   . Emphysema   . ESRD on hemodialysis ([place])    a. [facility] in [place], [place] [phone]/Dr. [name], on a MWF schedule.  She started dialysis in Feb 2014.  Etiology of renal failure not known, likely diabetes.  Has a left upper arm AV graft.  .  History of bronchitis    Mar 2012  . History of pneumonia    June 2012  . History of tobacco abuse    a. Quit 2012.  Marland Kitchen Hyperlipidemia   . Hypertension   . Moderate to severe mitral insufficiency    a. 10/2013 Echo: EF 55-60%, mod MR; b. 02/2016 Echo: EF 55-60%, mod to sev MR directed posteriorly--felt to be dynamic -worse with volume overload; c. 07/2017 Echo [facility]): EF >55%. Mod MR.  . Myocardial infarct ([place]) 05/2013  . Peripheral vascular disease ([place])   . Sickle cell trait ([place])   . Thyroid nodule    [place] 06/2017 due to f/u US in 1 year    Past Surgical History:  Procedure Laterality Date  . A/V FISTULAGRAM Left 11/10/2017   Procedure: A/V FISTULAGRAM;  Surgeon: Katha Cabal, MD;  Location: [place] CV LAB;  Service: Cardiovascular;  Laterality: Left;  . ABDOMINAL HYSTERECTOMY     2000  . CARDIAC CATHETERIZATION    . CARDIAC CATHETERIZATION N/A 05/02/2015   Procedure: Left Heart Cath and Coronary Angiography;  Surgeon: [name], MD;  Location: [place] CV LAB;  Service: Cardiovascular;  Laterality: N/A;  . COLONOSCOPY WITH PROPOFOL N/A 07/08/2016   Procedure: COLONOSCOPY WITH PROPOFOL;  Surgeon: [name], MD;  Location: [facility] ENDOSCOPY;  Service: Endoscopy;  Laterality: N/A;  . COLONOSCOPY WITH PROPOFOL N/A 08/20/2017   Procedure:  COLONOSCOPY WITH PROPOFOL;  Surgeon: [name], MD;  Location: [facility] ENDOSCOPY;  Service: Gastroenterology;  Laterality: N/A;  . colonscopy    . CORONARY ANGIOPLASTY  05/28/2014   stent placement to the mid RCA  . DILATION AND CURETTAGE OF UTERUS     several in the early 80's  . ESOPHAGOGASTRODUODENOSCOPY     2012  . EYE SURGERY     bilateral laser 2012  . EYE SURGERY     right  . EYE SURGERY     x4 both eyes  . GAS INSERTION  09/30/2011   Procedure: INSERTION OF GAS;  Surgeon: [name], MD;  Location: [place];  Service: Ophthalmology;  Laterality: Right;  [id number]  . GAS/FLUID EXCHANGE  09/30/2011   Procedure: GAS/FLUID  EXCHANGE;  Surgeon: [name], MD;  Location: [place];  Service: Ophthalmology;  Laterality: Right;  . LEFT HEART CATHETERIZATION WITH CORONARY ANGIOGRAM N/A 05/28/2013   Procedure: LEFT HEART CATHETERIZATION WITH CORONARY ANGIOGRAM;  Surgeon: [name], MD;  Location: [facility] CATH LAB;  Service: Cardiovascular;  Laterality: N/A;  . PARS PLANA VITRECTOMY  04/22/2011   Procedure: PARS PLANA VITRECTOMY WITH 25 GAUGE;  Surgeon: [name], MD;  Location: [place];  Service: Ophthalmology;  Laterality: Left;  membrane peel, endolaser, gas fluid exchange, silicone oil, repair of complex traction retinal detachment  . PARS PLANA VITRECTOMY  09/30/2011   Procedure: PARS PLANA VITRECTOMY WITH 25 GAUGE;  Surgeon: [name], MD;  Location: [place];  Service: Ophthalmology;  Laterality: Right;  Endolaser; Repair of Complex Traction Retinal Detachment  . PARS PLANA VITRECTOMY  02/24/2012   Procedure: PARS PLANA VITRECTOMY WITH 25 GAUGE;  Surgeon: [name], MD;  Location: [place];  Service: Ophthalmology;  Laterality: Left;  . PTCA    . SILICON OIL REMOVAL  8/46/9629   Procedure: SILICON OIL REMOVAL;  Surgeon: [name], MD;  Location: [place];  Service: Ophthalmology;  Laterality: Left;  . THROMBECTOMY / ARTERIOVENOUS GRAFT REVISION    . TUBAL LIGATION     1979    Allergies  Allergies  Allergen Reactions  . Hydrocodone Hives  . Metformin Diarrhea and Other (See Comments)    Other reaction(s): Distress (finding)  . Lisinopril     Unknown reaction     History of Present Illness    62 year old female with the above complex past medical history including end-stage renal disease on Monday, Wednesday, Friday dialysis, coronary artery disease status post prior RCA stenting, hypertension, hyperlipidemia, type 2 diabetes mellitus, moderate mitral regurgitation, remote tobacco abuse, COPD, sickle cell trait, peripheral vascular and carotid disease, and HFpEF.  Cardiac history dates back  to December 2014, when she suffered a non-STEMI and subsequently underwent drug-eluting stent placement to the RCA.  Most recent catheterization was November 2016, that was performed after an abnormal stress test showed patent RCA stent and otherwise nonobstructive disease.  With regards to her mitral regurgitation, her most recent echo in February 2019 at [facility] showed stable, moderate mitral regurgitation and aortic sclerosis.  LV function was normal.  She was last seen in clinic here in September 2018, at which time she was feeling relatively well.  Since then, she has mostly done well and has remained reasonably active.  She tolerates Monday Wednesday Friday at dialysis without incident.  In August, she was having worsening dyspnea and was seen in the emergency department.  Chest x-ray showed pleural effusions.  Since then, her dry weight was lowered  at dialysis and she has had resolution of dyspnea.  More recently, she has developed a cough which has not improved much using over-the-counter Mucinex.  She denies any chest pain, PND, orthopnea, dizziness, syncope, edema, or early satiety.  Home Medications    Prior to Admission medications   Medication Sig Start Date End Date Taking? Authorizing Provider  albuterol (PROVENTIL) (2.5 MG/3ML) 0.083% nebulizer solution Take 2.5 mg by nebulization every 6 (six) hours as needed for wheezing or shortness of breath.   Yes [provider]  albuterol (VENTOLIN HFA) 108 (90 Base) MCG/ACT inhaler Inhale into the lungs every 6 (six) hours as needed for wheezing or shortness of breath.   Yes [provider]  amLODipine (NORVASC) 10 MG tablet Take 10 mg by mouth daily.  09/17/16  Yes [provider]  aspirin EC 81 MG tablet Take 81 mg by mouth at bedtime.    Yes [provider]  atorvastatin (LIPITOR) 40 MG tablet Take 1 tablet (40 mg total) by mouth daily. At night 01/05/18  Yes McLean-Scocuzza, Nino Glow, MD  AURYXIA 1 GM 210 MG(Fe)  tablet Take 420-630 mg by mouth 3 (three) times daily with meals. Take 630 mg with each meal and 420 mg with each snack 09/17/17  Yes [provider]  carvedilol (COREG) 12.5 MG tablet 1 pill bid 01/05/18  Yes McLean-Scocuzza, Nino Glow, MD  cinacalcet (SENSIPAR) 60 MG tablet Take 60 mg by mouth daily.   Yes [provider]  cloNIDine (CATAPRES) 0.1 MG tablet Take 0.1 mg by mouth 2 (two) times daily.   Yes [provider]  furosemide (LASIX) 80 MG tablet Take 80 mg by mouth See admin instructions. Take 80 mg by mouth once daily on Sun, Tue, Thur, and Sat (non dialysis days)   Yes [provider]  isosorbide mononitrate (IMDUR) 60 MG 24 hr tablet Take 1 tablet (60 mg total) by mouth daily. 07/28/17  Yes Max Sane, MD  losartan (COZAAR) 50 MG tablet Take by mouth. 12/12/17  Yes [provider]  meloxicam (MOBIC) 7.5 MG tablet meloxicam 7.5 mg tablet   Yes [provider]  multivitamin (RENA-VIT) TABS tablet Take 1 tablet by mouth daily.   Yes [provider]  nitroGLYCERIN (NITROSTAT) 0.4 MG SL tablet Place 1 tablet (0.4 mg total) under the tongue every 5 (five) minutes as needed for chest pain. 01/15/17  Yes Burnard Hawthorne, FNP  ondansetron (ZOFRAN) 4 MG tablet Take 1 tablet (4 mg total) by mouth every 8 (eight) hours as needed for nausea or vomiting. 01/05/18  Yes McLean-Scocuzza, Nino Glow, MD  pregabalin (LYRICA) 75 MG capsule Take 1 capsule (75 mg total) by mouth daily. 04/21/16  Yes Arnett, Yvetta Coder, FNP  umeclidinium-vilanterol (ANORO ELLIPTA) 62.5-25 MCG/INH AEPB Inhale 1 puff into the lungs daily. 12/29/17  Yes McLean-Scocuzza, Nino Glow, MD  oxyCODONE-acetaminophen (PERCOCET/ROXICET) 5-325 MG tablet Take 1 tablet by mouth 2 (two) times daily as needed for pain.  10/04/17   [provider]    Review of Systems    Dyspnea earlier this summer which has since improved with production and dry weight at dialysis.  She denies chest  pain, palpitations, PND, orthopnea, dizziness, syncope, edema, or early satiety.  All other systems reviewed and are otherwise negative except as noted above.  Physical Exam    VS:  BP (!) 178/66 (BP Location: Right Arm, Patient Position: Sitting, Cuff Size: Normal)   Pulse 72   Ht 5' 7.5" (1.715  m)   Wt 165 lb 8 oz (75.1 kg)   SpO2 97%   BMI 25.54 kg/m  , BMI Body mass index is 25.54 kg/m.  Repeat blood pressure 180/60. GEN: Well nourished, well developed, in no acute distress. HEENT: normal. Neck: Supple, no JVD, bilateral carotid bruits, left greater than right.   Cardiac: RRR, 2/6 systolic murmur loudest at the upper sternal borders.  2/6 systolic murmur at the apex radiating to the left axilla.  No rubs, or gallops. No clubbing, cyanosis, edema.  Radials/DP/PT 1+ and equal bilaterally.  Respiratory:  Respirations regular and unlabored, initially noted basilar rhonchi which cleared with additional breathing.  Otherwise clear to auscultation bilaterally. GI: Soft, nontender, nondistended, BS + x 4. MS: no deformity or atrophy. Skin: warm and dry, no rash. Neuro:  Strength and sensation are intact. Psych: Normal affect.  Accessory Clinical Findings    ECG personally reviewed by me today -regular sinus rhythm, 72, LVH, left atrial enlargement- no acute changes.  Assessment & Plan    1.  Moderate mitral regurgitation: This is been followed annually with echoes at [facility].  Most recent was in February which showed stable, moderate mitral regurgitation normal LV function.  Though she was having dyspnea earlier this summer in the setting of bilateral pleural effusions, this has resolved with reduction and dry weight at dialysis.  We will plan to follow-up echo next year.  2.  Coronary artery disease: Status post prior RCA stenting with annual stress test at [facility] since.  Most recent was in January 2019 and was nonischemic.  She has not been having any chest pain.  She remains on aspirin,  statin, beta-blocker, ARB, and nitrate.  3.  HFpEF: Grade 2 diastolic dysfunction on echo in February.  Volume management per hemodialysis.  Blood pressure elevated today at 178/66.  I am increasing her carvedilol to 18.75 mg twice daily.  4.  Essential hypertension: See #3.  Increasing carvedilol to 18.75 mg twice daily.  Continue amlodipine, clonidine, isosorbide, and losartan.  5.  Hyperlipidemia: LDL was 55 in January.  LFTs within normal limits at that time.  Continue statin therapy.  6.  End-stage renal disease: Monday Wednesday Friday dialysis.  She tolerates this well.  7.  PVD:  Followed by vascular surgery. No claudication.  8.  Disposition: Follow-up in 6 months or sooner if necessary.  Murray Hodgkins, NP 02/16/2018, 9:38 AM

## 2018-02-17 ENCOUNTER — Encounter: Admit: 2018-02-17 | Discharge: 2018-02-17 | Payer: MEDICARE | Attending: Nephrology | Primary: Nephrology

## 2018-02-17 DIAGNOSIS — N186 End stage renal disease: Secondary | ICD-10-CM | POA: Diagnosis not present

## 2018-02-17 DIAGNOSIS — D509 Iron deficiency anemia, unspecified: Secondary | ICD-10-CM | POA: Diagnosis not present

## 2018-02-17 DIAGNOSIS — D631 Anemia in chronic kidney disease: Secondary | ICD-10-CM | POA: Diagnosis not present

## 2018-02-17 DIAGNOSIS — Z992 Dependence on renal dialysis: Secondary | ICD-10-CM | POA: Diagnosis not present

## 2018-02-17 DIAGNOSIS — N2581 Secondary hyperparathyroidism of renal origin: Secondary | ICD-10-CM | POA: Diagnosis not present

## 2018-02-19 DIAGNOSIS — N186 End stage renal disease: Secondary | ICD-10-CM | POA: Diagnosis not present

## 2018-02-19 DIAGNOSIS — N2581 Secondary hyperparathyroidism of renal origin: Secondary | ICD-10-CM | POA: Diagnosis not present

## 2018-02-19 DIAGNOSIS — D509 Iron deficiency anemia, unspecified: Secondary | ICD-10-CM | POA: Diagnosis not present

## 2018-02-19 DIAGNOSIS — Z992 Dependence on renal dialysis: Secondary | ICD-10-CM | POA: Diagnosis not present

## 2018-02-19 DIAGNOSIS — D631 Anemia in chronic kidney disease: Secondary | ICD-10-CM | POA: Diagnosis not present

## 2018-02-22 DIAGNOSIS — D509 Iron deficiency anemia, unspecified: Secondary | ICD-10-CM | POA: Diagnosis not present

## 2018-02-22 DIAGNOSIS — D631 Anemia in chronic kidney disease: Secondary | ICD-10-CM | POA: Diagnosis not present

## 2018-02-22 DIAGNOSIS — N2581 Secondary hyperparathyroidism of renal origin: Secondary | ICD-10-CM | POA: Diagnosis not present

## 2018-02-22 DIAGNOSIS — N186 End stage renal disease: Secondary | ICD-10-CM | POA: Diagnosis not present

## 2018-02-22 DIAGNOSIS — Z992 Dependence on renal dialysis: Secondary | ICD-10-CM | POA: Diagnosis not present

## 2018-02-23 ENCOUNTER — Ambulatory Visit
Admission: RE | Admit: 2018-02-23 | Discharge: 2018-02-23 | Disposition: A | Discharge status: Home / Self Care | Payer: Medicare Other | Source: Ambulatory Visit | Attending: Gastroenterology | Admitting: Gastroenterology

## 2018-02-23 ENCOUNTER — Ambulatory Visit (INDEPENDENT_AMBULATORY_CARE_PROVIDER_SITE_OTHER): Payer: Medicare Other | Admitting: Gastroenterology

## 2018-02-23 ENCOUNTER — Other Ambulatory Visit: Payer: Self-pay

## 2018-02-23 ENCOUNTER — Encounter: Payer: Self-pay | Admitting: Gastroenterology

## 2018-02-23 VITALS — BP 159/73 | HR 69 | Ht 67.5 in | Wt 165.4 lb

## 2018-02-23 DIAGNOSIS — Z01818 Encounter for other preprocedural examination: Secondary | ICD-10-CM

## 2018-02-23 DIAGNOSIS — Z7682 Awaiting organ transplant status: Principal | ICD-10-CM

## 2018-02-23 DIAGNOSIS — N186 End stage renal disease: Secondary | ICD-10-CM

## 2018-02-23 DIAGNOSIS — I6523 Occlusion and stenosis of bilateral carotid arteries: Secondary | ICD-10-CM | POA: Diagnosis not present

## 2018-02-23 DIAGNOSIS — R112 Nausea with vomiting, unspecified: Secondary | ICD-10-CM

## 2018-02-23 DIAGNOSIS — J90 Pleural effusion, not elsewhere classified: Secondary | ICD-10-CM | POA: Diagnosis not present

## 2018-02-23 MED ORDER — RANITIDINE HCL 150 MG PO TABS: 150.0000 mg | ORAL_TABLET | Freq: Two times a day (BID) | ORAL | 2 refills | Status: DC

## 2018-02-23 NOTE — Progress Notes (Signed)
[name] MD, MRCP(U.K) [address]  [place]  [place], [place] [address]  Main: [phone]  Fax: [phone]   Gastroenterology Consultation  Referring Provider:     McLean-Scocuzza, Olivia Mackie * Primary Care Physician:  McLean-Scocuzza, Nino Glow, MD Primary Gastroenterologist:  Dr. [name]  Reason for Consultation:     Nausea and vomiting         HPI:   Theresa [name] is a 62 y.o. y/o female referred for consultation & management  by Dr. Terese Door, Nino Glow, MD.     She has been referred for nausea and vomiting.She is on home oxygen for COPD. H/o CHF,ESRD. Smoker in the past . CT scan on 01/12/18 shows moderate B/l pleural effusions. HB  8.8 with MCV of 104 . I performed her colonoscopy in 08/2017 10 polyps excised. Multiple tubular adenomas on pathology. Repeat in 3 years. She follows with [facility] for renal transplant which she is awaiting   She has been seen back in 2015 by Dr Owens Loffler GI at [place]. H/o C diff , diron deficiency anemia and eGD in 2012 showing AVM of the stomach . She has had a history of constipation in the past , nausea and vomiting,    She says she has had nausea/vomiting for the past year, not every day , probably 3 days of the week. She says that occurs when she wakes up in the mornings, denies any heart burn  . Occasionally at night before bed time. When she throws up , food she ate that day or water comes up . Lost weight . Poor apetite.   Not on any medications for acid reflux. She has been on oxycodone for arthtiris for the past 3 years.   Zofran helps but still gets sick at times.   Past Medical History:  Diagnosis Date  . Anemia of chronic disease   . Carotid arterial disease ([place])    a. 02/2013 U/S: 40-59% bilat ICA stenosis.  . Chronic constipation   . Chronic diastolic CHF (congestive heart failure) ([place])    a. 10/2013 Echo [facility]): EF 55-60%, mod conc LVH, mod MR, mildly dil LA, mild Ao sclerosis w/o stenosis; b. 02/2016 Echo: EF  55-60%, no rwma, Gr DD, mild AS, mod to sev MR, mildly dil LA, PASP 66mmHg; c. 07/2017 Echo [facility]): EF >55%, mild to mod LVH, Gr2 DD, Ao scl, Mod MR, mod dil LA, nl RV fxn.  . Colon polyps   . COPD (chronic obstructive pulmonary disease) ([place])   . Coronary artery disease    a. 05/2013 NSTEMI/PCI: LM 20d, LAD min irregs, LCX small, nl, OM1 nl, RCA dom 25m (2.5x16 Promus DES), PDA1 80p; b. 04/2015 Cath: LM nl, LAD 9m, D1/2 min irregs, LCX 35p/m, OM2/3 min irregs, RCA patent mid stent, RPDA 70ost, RPLB1 30, RPLB2/3 min irregs, EF 55-65%--> Med Rx; c. 06/2017 MV [facility]): No ischemia/infarct, EF 64%.  . Diabetes mellitus   . Diabetic neuropathy ([place])   . Diabetic retinopathy ([place]) 05/28/2013   Hx bilat retinal detachment, proliferative diab retinopathy and bilat vitreous hemorrhage   . Emphysema   . ESRD on hemodialysis ([place])    a. [facility] in [place], [place] [phone]/Dr. [name], on a MWF schedule.  She started dialysis in Feb 2014.  Etiology of renal failure not known, likely diabetes.  Has a left upper arm AV graft.  . History of bronchitis    Mar 2012  . History of pneumonia    June 2012  .  History of tobacco abuse    a. Quit 2012.  Marland Kitchen Hyperlipidemia   . Hypertension   . Moderate to severe mitral insufficiency    a. 10/2013 Echo: EF 55-60%, mod MR; b. 02/2016 Echo: EF 55-60%, mod to sev MR directed posteriorly--felt to be dynamic -worse with volume overload; c. 07/2017 Echo [facility]): EF >55%. Mod MR.  . Myocardial infarct ([place]) 05/2013  . Peripheral vascular disease ([place])   . Sickle cell trait ([place])   . Thyroid nodule    [place] 06/2017 due to f/u US in 1 year     Past Surgical History:  Procedure Laterality Date  . A/V FISTULAGRAM Left 11/10/2017   Procedure: A/V FISTULAGRAM;  Surgeon: Katha Cabal, MD;  Location: [place] [facility];  Service: Cardiovascular;  Laterality: Left;  . ABDOMINAL HYSTERECTOMY     2000  . CARDIAC CATHETERIZATION    . CARDIAC CATHETERIZATION N/A 05/02/2015    Procedure: Left Heart Cath and Coronary Angiography;  Surgeon: [name], MD;  Location: [place] [facility];  Service: Cardiovascular;  Laterality: N/A;  . COLONOSCOPY WITH PROPOFOL N/A 07/08/2016   Procedure: COLONOSCOPY WITH PROPOFOL;  Surgeon: [name], MD;  Location: [facility] ENDOSCOPY;  Service: Endoscopy;  Laterality: N/A;  . COLONOSCOPY WITH PROPOFOL N/A 08/20/2017   Procedure: COLONOSCOPY WITH PROPOFOL;  Surgeon: [name], MD;  Location: [facility] ENDOSCOPY;  Service: Gastroenterology;  Laterality: N/A;  . colonscopy    . CORONARY ANGIOPLASTY  05/28/2014   stent placement to the mid RCA  . DILATION AND CURETTAGE OF UTERUS     several in the early 80's  . ESOPHAGOGASTRODUODENOSCOPY     2012  . EYE SURGERY     bilateral laser 2012  . EYE SURGERY     right  . EYE SURGERY     x4 both eyes  . GAS INSERTION  09/30/2011   Procedure: INSERTION OF GAS;  Surgeon: [name], MD;  Location: [place];  Service: Ophthalmology;  Laterality: Right;  [id number]  . GAS/FLUID EXCHANGE  09/30/2011   Procedure: GAS/FLUID EXCHANGE;  Surgeon: [name], MD;  Location: [place];  Service: Ophthalmology;  Laterality: Right;  . LEFT HEART CATHETERIZATION WITH CORONARY ANGIOGRAM N/A 05/28/2013   Procedure: LEFT HEART CATHETERIZATION WITH CORONARY ANGIOGRAM;  Surgeon: [name], MD;  Location: [facility] CATH LAB;  Service: Cardiovascular;  Laterality: N/A;  . PARS PLANA VITRECTOMY  04/22/2011   Procedure: PARS PLANA VITRECTOMY WITH 25 GAUGE;  Surgeon: [name], MD;  Location: [place];  Service: Ophthalmology;  Laterality: Left;  membrane peel, endolaser, gas fluid exchange, silicone oil, repair of complex traction retinal detachment  . PARS PLANA VITRECTOMY  09/30/2011   Procedure: PARS PLANA VITRECTOMY WITH 25 GAUGE;  Surgeon: [name], MD;  Location: [place];  Service: Ophthalmology;  Laterality: Right;  Endolaser; Repair of Complex Traction Retinal Detachment  . PARS PLANA VITRECTOMY   02/24/2012   Procedure: PARS PLANA VITRECTOMY WITH 25 GAUGE;  Surgeon: [name], MD;  Location: [place];  Service: Ophthalmology;  Laterality: Left;  . PTCA    . SILICON OIL REMOVAL  09/15/8117   Procedure: SILICON OIL REMOVAL;  Surgeon: [name], MD;  Location: [place];  Service: Ophthalmology;  Laterality: Left;  . THROMBECTOMY / ARTERIOVENOUS GRAFT REVISION    . TUBAL LIGATION     1979    Prior to Admission medications   Medication Sig Start Date End Date Taking? Authorizing Provider  albuterol (PROVENTIL) (2.5 MG/3ML) 0.083% nebulizer solution Take 2.5 mg by nebulization every 6 (six) hours as needed for wheezing or shortness of breath.    [provider]  albuterol (VENTOLIN HFA) 108 (90 Base) MCG/ACT inhaler Inhale into the lungs every 6 (six) hours as needed for wheezing or shortness of breath.    [provider]  amLODipine (NORVASC) 10 MG tablet Take 10 mg by mouth daily.  09/17/16   [provider]  aspirin EC 81 MG tablet Take 81 mg by mouth at bedtime.     [provider]  atorvastatin (LIPITOR) 40 MG tablet Take 1 tablet (40 mg total) by mouth daily. At night 01/05/18   McLean-Scocuzza, Nino Glow, MD  AURYXIA 1 GM 210 MG(Fe) tablet Take 420-630 mg by mouth 3 (three) times daily with meals. Take 630 mg with each meal and 420 mg with each snack 09/17/17   [provider]  carvedilol (COREG) 12.5 MG tablet Take 1.5 tablets (18.75 mg) by mouth twice daily 02/16/18   Theora Gianotti, NP  cinacalcet (SENSIPAR) 60 MG tablet Take 60 mg by mouth daily.    [provider]  cloNIDine (CATAPRES) 0.1 MG tablet Take 0.1 mg by mouth 2 (two) times daily.    [provider]  furosemide (LASIX) 80 MG tablet Take 80 mg by mouth See admin instructions. Take 80 mg by mouth once daily on Sun, Tue, Thur, and Sat (non dialysis days)    [provider]  isosorbide mononitrate (IMDUR) 60 MG 24 hr tablet Take 1 tablet (60 mg  total) by mouth daily. 07/28/17   Max Sane, MD  losartan (COZAAR) 50 MG tablet Take by mouth. 12/12/17   [provider]  meloxicam (MOBIC) 7.5 MG tablet meloxicam 7.5 mg tablet    [provider]  multivitamin (RENA-VIT) TABS tablet Take 1 tablet by mouth daily.    [provider]  nitroGLYCERIN (NITROSTAT) 0.4 MG SL tablet Place 1 tablet (0.4 mg total) under the tongue every 5 (five) minutes as needed for chest pain. 01/15/17   Burnard Hawthorne, FNP  ondansetron (ZOFRAN) 4 MG tablet Take 1 tablet (4 mg total) by mouth every 8 (eight) hours as needed for nausea or vomiting. 01/05/18   McLean-Scocuzza, Nino Glow, MD  oxyCODONE-acetaminophen (PERCOCET/ROXICET) 5-325 MG tablet Take 1 tablet by mouth 2 (two) times daily as needed for pain.  10/04/17   [provider]  pregabalin (LYRICA) 75 MG capsule Take 1 capsule (75 mg total) by mouth daily. 04/21/16   Burnard Hawthorne, FNP  umeclidinium-vilanterol (ANORO ELLIPTA) 62.5-25 MCG/INH AEPB Inhale 1 puff into the lungs daily. 12/29/17   McLean-Scocuzza, Nino Glow, MD    Family History  Problem Relation Age of Onset  . Stroke Mother   . Heart attack Mother   . Heart disease Mother   . Colon cancer Father   . Colon cancer Sister   . Heart attack Brother   . Stroke Brother   . Diabetes Brother   . Breast cancer Sister   . Diabetes Brother   . Diabetes Daughter   . Anesthesia problems Neg Hx   . Hypotension Neg Hx   . Malignant hyperthermia Neg Hx   . Pseudochol deficiency Neg Hx      Social History   Tobacco Use  . Smoking status: Former Smoker    Packs/day: 1.00    Years: 25.00    Pack years: 25.00    Last attempt to quit:  06/09/2010    Years since quitting: 7.7  . Smokeless tobacco: Never Used  Substance Use Topics  . Alcohol use: No  . Drug use: No    Allergies as of 02/23/2018 - Review Complete 02/16/2018  Allergen Reaction Noted  . Hydrocodone Hives 08/20/2012  . Metformin Diarrhea and Other  (See Comments) 10/26/2013  . Lisinopril  03/11/2017    Review of Systems:    All systems reviewed and negative except where noted in HPI.   Physical Exam:  There were no vitals taken for this visit. No LMP recorded. Patient has had a hysterectomy. Psych:  Alert and cooperative. Normal mood and affect. General:   Alert,  Well-developed, well-nourished, pleasant and cooperative in NAD Head:  Normocephalic and atraumatic. Eyes:  Sclera clear, no icterus.   Conjunctiva pink. Ears:  Normal auditory acuity. Nose:  No deformity, discharge, or lesions. Mouth:  No deformity or lesions,oropharynx pink & moist. Neck:  Supple; no masses or thyromegaly. Lungs:  Respirations even and unlabored.  Clear throughout to auscultation.   No wheezes, crackles, or rhonchi. No acute distress. Heart:  Regular rate and rhythm; no murmurs, clicks, rubs, or gallops. Abdomen:  Normal bowel sounds.  No bruits.  Soft, non-tender and non-distended without masses, hepatosplenomegaly or hernias noted.  No guarding or rebound tenderness.    Neurologic:  Alert and oriented x3;  grossly normal neurologically. Skin:  Intact without significant lesions or rashes. No jaundice. Lymph Nodes:  No significant cervical adenopathy. Psych:  Alert and cooperative. Normal mood and affect.  Imaging Studies: No results found.  Assessment and Plan:   Theresa [name] is a 62 y.o. y/o female has been referred for  nausea and vomiting. H/o ESRD on dialysis. Diabetic but on any insulin and well controlled. Long term use of oxycodone.Clinically her lungs appear clear, she had b/l effusions on CT scan in 01/2018   Plan  1. H pylori breath test  2. Trial of Zantac to treat any reflux related nausea, vomiting  3. Limit use of narcotics as it can cause gastroparesis and nausea as a side effect  4. CXR to check if effusions have resolved and if they have then can proceed with an EGD.  5. If negative would obtain gastric emptying study   I  have discussed alternative options, risks & benefits,  which include, but are not limited to, bleeding, infection, perforation,respiratory complication & drug reaction.  The patient agrees with this plan & written consent will be obtained.     Follow up in 6 weeks   Dr [name] MD,MRCP(U.K)

## 2018-02-24 DIAGNOSIS — D509 Iron deficiency anemia, unspecified: Secondary | ICD-10-CM | POA: Diagnosis not present

## 2018-02-24 DIAGNOSIS — D631 Anemia in chronic kidney disease: Secondary | ICD-10-CM | POA: Diagnosis not present

## 2018-02-24 DIAGNOSIS — Z992 Dependence on renal dialysis: Secondary | ICD-10-CM | POA: Diagnosis not present

## 2018-02-24 DIAGNOSIS — N2581 Secondary hyperparathyroidism of renal origin: Secondary | ICD-10-CM | POA: Diagnosis not present

## 2018-02-24 DIAGNOSIS — N186 End stage renal disease: Secondary | ICD-10-CM | POA: Diagnosis not present

## 2018-02-25 ENCOUNTER — Ambulatory Visit: Payer: Medicare Other

## 2018-02-25 LAB — H. PYLORI BREATH TEST: H pylori Breath Test: NEGATIVE

## 2018-02-26 DIAGNOSIS — D509 Iron deficiency anemia, unspecified: Secondary | ICD-10-CM | POA: Diagnosis not present

## 2018-02-26 DIAGNOSIS — N2581 Secondary hyperparathyroidism of renal origin: Secondary | ICD-10-CM | POA: Diagnosis not present

## 2018-02-26 DIAGNOSIS — N186 End stage renal disease: Secondary | ICD-10-CM | POA: Diagnosis not present

## 2018-02-26 DIAGNOSIS — Z992 Dependence on renal dialysis: Secondary | ICD-10-CM | POA: Diagnosis not present

## 2018-02-26 DIAGNOSIS — D631 Anemia in chronic kidney disease: Secondary | ICD-10-CM | POA: Diagnosis not present

## 2018-02-28 ENCOUNTER — Encounter: Payer: Self-pay | Admitting: Gastroenterology

## 2018-03-01 DIAGNOSIS — Z992 Dependence on renal dialysis: Secondary | ICD-10-CM | POA: Diagnosis not present

## 2018-03-01 DIAGNOSIS — N186 End stage renal disease: Secondary | ICD-10-CM | POA: Diagnosis not present

## 2018-03-01 DIAGNOSIS — D631 Anemia in chronic kidney disease: Secondary | ICD-10-CM | POA: Diagnosis not present

## 2018-03-01 DIAGNOSIS — D509 Iron deficiency anemia, unspecified: Secondary | ICD-10-CM | POA: Diagnosis not present

## 2018-03-01 DIAGNOSIS — N2581 Secondary hyperparathyroidism of renal origin: Secondary | ICD-10-CM | POA: Diagnosis not present

## 2018-03-02 ENCOUNTER — Ambulatory Visit
Admission: RE | Admit: 2018-03-02 | Discharge: 2018-03-02 | Disposition: A | Discharge status: Home / Self Care | Payer: Medicare Other | Source: Ambulatory Visit | Attending: Internal Medicine | Admitting: Internal Medicine

## 2018-03-02 DIAGNOSIS — Z1231 Encounter for screening mammogram for malignant neoplasm of breast: Secondary | ICD-10-CM | POA: Diagnosis not present

## 2018-03-03 ENCOUNTER — Ambulatory Visit: Payer: Medicare Other

## 2018-03-03 DIAGNOSIS — N2581 Secondary hyperparathyroidism of renal origin: Secondary | ICD-10-CM | POA: Diagnosis not present

## 2018-03-03 DIAGNOSIS — N186 End stage renal disease: Secondary | ICD-10-CM | POA: Diagnosis not present

## 2018-03-03 DIAGNOSIS — D509 Iron deficiency anemia, unspecified: Secondary | ICD-10-CM | POA: Diagnosis not present

## 2018-03-03 DIAGNOSIS — Z992 Dependence on renal dialysis: Secondary | ICD-10-CM | POA: Diagnosis not present

## 2018-03-03 DIAGNOSIS — D631 Anemia in chronic kidney disease: Secondary | ICD-10-CM | POA: Diagnosis not present

## 2018-03-04 ENCOUNTER — Ambulatory Visit (INDEPENDENT_AMBULATORY_CARE_PROVIDER_SITE_OTHER): Payer: Medicare Other

## 2018-03-04 ENCOUNTER — Other Ambulatory Visit: Payer: Self-pay

## 2018-03-04 ENCOUNTER — Telehealth: Payer: Self-pay | Admitting: Internal Medicine

## 2018-03-04 VITALS — BP 128/64 | HR 70 | Temp 98.6°F | Resp 16 | Ht 67.5 in | Wt 167.1 lb

## 2018-03-04 DIAGNOSIS — Z0000 Encounter for general adult medical examination without abnormal findings: Secondary | ICD-10-CM | POA: Diagnosis not present

## 2018-03-04 NOTE — Progress Notes (Signed)
Agree with below   Dr. Gayland Curry

## 2018-03-04 NOTE — Telephone Encounter (Signed)
Paperwork placed in provider folder to be filled oput.

## 2018-03-04 NOTE — Telephone Encounter (Signed)
Pt dropped off a disability form to be completed. Form is up front in color folder.

## 2018-03-04 NOTE — Patient Instructions (Addendum)
  Theresa [name] , Thank you for taking time to come for your Medicare Wellness Visit. I appreciate your ongoing commitment to your health goals. Please review the following plan we discussed and let me know if I can assist you in the future.   Follow up as needed.    Return in 2 weeks for follow up with your doctor.  Bring a copy of your [place] and/or Living Will to be scanned into chart.  Have a great day!  These are the goals we discussed: Goals    . Healthy Lifestyle     Fluid restriction of 32 ounces Healthy diet Stay active       This is a list of the screening recommended for you and due dates:  Health Maintenance  Topic Date Due  . Complete foot exam   12/19/2015  . Hemoglobin A1C  11/20/2016  . Mammogram  01/06/2018  . Eye exam for diabetics  05/13/2018  . Pap Smear  11/15/2019  . Tetanus Vaccine  06/09/2020  . Colon Cancer Screening  08/20/2020  . Flu Shot  Completed  . Pneumococcal vaccine  Completed  .  Hepatitis C: One time screening is recommended by Center for Disease Control  (CDC) for  adults born from [birth year] through 1963.   Completed  . HIV Screening  Completed

## 2018-03-04 NOTE — Progress Notes (Signed)
Subjective:   Theresa [name] is a 62 y.o. female who presents for Medicare Annual (Subsequent) preventive examination.  Review of Systems:  No ROS.  Medicare Wellness Visit. Additional risk factors are reflected in the social history. Cardiac Risk Factors include: advanced age (>83men, >41 women);diabetes mellitus;hypertension     Objective:     Vitals: BP 128/64 (BP Location: Right Arm, Patient Position: Sitting, Cuff Size: Normal)   Pulse 70   Temp 98.6 F (37 C) (Oral)   Resp 16   Ht 5' 7.5" (1.715 m)   Wt 167 lb 1.9 oz (75.8 kg)   SpO2 95%   BMI 25.79 kg/m   Body mass index is 25.79 kg/m.  Advanced Directives 03/04/2018 01/12/2018 09/26/2017 08/20/2017 07/26/2017 07/26/2017 07/15/2017  Does Patient Have a Medical Advance Directive? No No Yes No No No No  Type of Advance Directive - Public librarian;Living will - - - -  Does patient want to make changes to medical advance directive? Yes (MAU/Ambulatory/Procedural Areas - Information given) - - - - - -  Copy of Healthcare Power of Attorney in Chart? - - No - copy requested - - - -  Would patient like information on creating a medical advance directive? - No - Patient declined - No - Patient declined No - Patient declined - No - Patient declined  Pre-existing out of facility DNR order (yellow form or pink MOST form) - - - - - - -    Tobacco Social History   Tobacco Use  Smoking Status Former Smoker  . Packs/day: 1.00  . Years: 25.00  . Pack years: 25.00  . Last attempt to quit: 06/09/2010  . Years since quitting: 7.7  Smokeless Tobacco Never Used     Counseling given: Not Answered   Clinical Intake:  Pre-visit preparation completed: Yes  Pain : No/denies pain     Nutritional Status: BMI 25 -29 Overweight Diabetes: Yes(Followed by pcp)  How often do you need to have someone help you when you read instructions, pamphlets, or other written materials from your doctor or pharmacy?: 1 - Never  Interpreter  Needed?: No     Past Medical History:  Diagnosis Date  . Anemia of chronic disease   . Carotid arterial disease ([place])    a. 02/2013 U/S: 40-59% bilat ICA stenosis.  . Chronic constipation   . Chronic diastolic CHF (congestive heart failure) ([place])    a. 10/2013 Echo [facility]): EF 55-60%, mod conc LVH, mod MR, mildly dil LA, mild Ao sclerosis w/o stenosis; b. 02/2016 Echo: EF 55-60%, no rwma, Gr DD, mild AS, mod to sev MR, mildly dil LA, PASP 17mmHg; c. 07/2017 Echo [facility]): EF >55%, mild to mod LVH, Gr2 DD, Ao scl, Mod MR, mod dil LA, nl RV fxn.  . Colon polyps   . COPD (chronic obstructive pulmonary disease) ([place])   . Coronary artery disease    a. 05/2013 NSTEMI/PCI: LM 20d, LAD min irregs, LCX small, nl, OM1 nl, RCA dom 70m (2.5x16 Promus DES), PDA1 80p; b. 04/2015 Cath: LM nl, LAD 44m, D1/2 min irregs, LCX 35p/m, OM2/3 min irregs, RCA patent mid stent, RPDA 70ost, RPLB1 30, RPLB2/3 min irregs, EF 55-65%--> Med Rx; c. 06/2017 [facility]): No ischemia/infarct, EF 64%.  . Diabetes mellitus   . Diabetic neuropathy ([place])   . Diabetic retinopathy ([place]) 05/28/2013   Hx bilat retinal detachment, proliferative diab retinopathy and bilat vitreous hemorrhage   . Emphysema   . ESRD on  hemodialysis ([place])    a. [facility] in [place], [place] [phone]/Dr. [name], on a MWF schedule.  She started dialysis in Feb 2014.  Etiology of renal failure not known, likely diabetes.  Has a left upper arm AV graft.  . History of bronchitis    Mar 2012  . History of pneumonia    June 2012  . History of tobacco abuse    a. Quit 2012.  Marland Kitchen Hyperlipidemia   . Hypertension   . Moderate to severe mitral insufficiency    a. 10/2013 Echo: EF 55-60%, mod MR; b. 02/2016 Echo: EF 55-60%, mod to sev MR directed posteriorly--felt to be dynamic -worse with volume overload; c. 07/2017 Echo [facility]): EF >55%. Mod MR.  . Myocardial infarct ([place]) 05/2013  . Peripheral vascular disease ([place])   . Sickle cell trait ([place])   . Thyroid nodule    [place]  06/2017 due to f/u US in 1 year    Past Surgical History:  Procedure Laterality Date  . A/V FISTULAGRAM Left 11/10/2017   Procedure: A/V FISTULAGRAM;  Surgeon: Katha Cabal, MD;  Location: [place] CV [facility];  Service: Cardiovascular;  Laterality: Left;  . ABDOMINAL HYSTERECTOMY     2000  . CARDIAC CATHETERIZATION    . CARDIAC CATHETERIZATION N/A 05/02/2015   Procedure: Left Heart Cath and Coronary Angiography;  Surgeon: [name], MD;  Location: [place] CV [facility];  Service: Cardiovascular;  Laterality: N/A;  . COLONOSCOPY WITH PROPOFOL N/A 07/08/2016   Procedure: COLONOSCOPY WITH PROPOFOL;  Surgeon: [name], MD;  Location: [facility] ENDOSCOPY;  Service: Endoscopy;  Laterality: N/A;  . COLONOSCOPY WITH PROPOFOL N/A 08/20/2017   Procedure: COLONOSCOPY WITH PROPOFOL;  Surgeon: [name], MD;  Location: [facility] ENDOSCOPY;  Service: Gastroenterology;  Laterality: N/A;  . colonscopy    . CORONARY ANGIOPLASTY  05/28/2014   stent placement to the mid RCA  . DILATION AND CURETTAGE OF UTERUS     several in the early 80's  . ESOPHAGOGASTRODUODENOSCOPY     2012  . EYE SURGERY     bilateral laser 2012  . EYE SURGERY     right  . EYE SURGERY     x4 both eyes  . GAS INSERTION  09/30/2011   Procedure: INSERTION OF GAS;  Surgeon: [name], MD;  Location: [place];  Service: Ophthalmology;  Laterality: Right;  [id number]  . GAS/FLUID EXCHANGE  09/30/2011   Procedure: GAS/FLUID EXCHANGE;  Surgeon: [name], MD;  Location: [place];  Service: Ophthalmology;  Laterality: Right;  . LEFT HEART CATHETERIZATION WITH CORONARY ANGIOGRAM N/A 05/28/2013   Procedure: LEFT HEART CATHETERIZATION WITH CORONARY ANGIOGRAM;  Surgeon: [name], MD;  Location: [facility] CATH LAB;  Service: Cardiovascular;  Laterality: N/A;  . PARS PLANA VITRECTOMY  04/22/2011   Procedure: PARS PLANA VITRECTOMY WITH 25 GAUGE;  Surgeon: [name], MD;  Location: [place];  Service: Ophthalmology;  Laterality: Left;   membrane peel, endolaser, gas fluid exchange, silicone oil, repair of complex traction retinal detachment  . PARS PLANA VITRECTOMY  09/30/2011   Procedure: PARS PLANA VITRECTOMY WITH 25 GAUGE;  Surgeon: [name], MD;  Location: [place];  Service: Ophthalmology;  Laterality: Right;  Endolaser; Repair of Complex Traction Retinal Detachment  . PARS PLANA VITRECTOMY  02/24/2012   Procedure: PARS PLANA VITRECTOMY WITH 25 GAUGE;  Surgeon: [name], MD;  Location: [place];  Service: Ophthalmology;  Laterality: Left;  . PTCA    . SILICON OIL  REMOVAL  02/09/4096   Procedure: SILICON OIL REMOVAL;  Surgeon: [name], MD;  Location: [place];  Service: Ophthalmology;  Laterality: Left;  . THROMBECTOMY / ARTERIOVENOUS GRAFT REVISION    . TUBAL LIGATION     1979   Family History  Problem Relation Age of Onset  . Stroke Mother   . Heart attack Mother   . Heart disease Mother   . Colon cancer Father   . Colon cancer Sister   . Heart attack Brother   . Stroke Brother   . Diabetes Brother   . Breast cancer Sister   . Diabetes Brother   . Diabetes Daughter   . Anesthesia problems Neg Hx   . Hypotension Neg Hx   . Malignant hyperthermia Neg Hx   . Pseudochol deficiency Neg Hx    Social History   Socioeconomic History  . Marital status: Single    Spouse name: Not on file  . Number of children: 2  . Years of education: Not on file  . Highest education level: Not on file  Occupational History  . Not on file  Social Needs  . Financial resource strain: Not on file  . Food insecurity:    Worry: Not on file    Inability: Not on file  . Transportation needs:    Medical: Not on file    Non-medical: Not on file  Tobacco Use  . Smoking status: Former Smoker    Packs/day: 1.00    Years: 25.00    Pack years: 25.00    Last attempt to quit: 06/09/2010    Years since quitting: 7.7  . Smokeless tobacco: Never Used  Substance and Sexual Activity  . Alcohol use: No  . Drug use: No  .  Sexual activity: Never  Lifestyle  . Physical activity:    Days per week: Not on file    Minutes per session: Not on file  . Stress: Not on file  Relationships  . Social connections:    Talks on phone: Not on file    Gets together: Not on file    Attends religious service: Not on file    Active member of club or organization: Not on file    Attends meetings of clubs or organizations: Not on file    Relationship status: Not on file  Other Topics Concern  . Not on file  Social History Narrative   On disability.    Lives with son Brenton Grills   2 children (has son and daughter)      Pandora Leiter drives her since her vision has decreased    Outpatient Encounter Medications as of 03/04/2018  Medication Sig  . albuterol (PROVENTIL) (2.5 MG/3ML) 0.083% nebulizer solution Take 2.5 mg by nebulization every 6 (six) hours as needed for wheezing or shortness of breath.  Marland Kitchen albuterol (VENTOLIN HFA) 108 (90 Base) MCG/ACT inhaler Inhale into the lungs every 6 (six) hours as needed for wheezing or shortness of breath.  Marland Kitchen amLODipine (NORVASC) 10 MG tablet Take 10 mg by mouth daily.   Marland Kitchen aspirin EC 81 MG tablet Take 81 mg by mouth at bedtime.   Marland Kitchen atorvastatin (LIPITOR) 40 MG tablet Take 1 tablet (40 mg total) by mouth daily. At night  . AURYXIA 1 GM 210 MG(Fe) tablet Take 420-630 mg by mouth 3 (three) times daily with meals. Take 630 mg with each meal and 420 mg with each snack  . carvedilol (COREG) 12.5 MG tablet Take 1.5 tablets (18.75 mg) by  mouth twice daily  . cinacalcet (SENSIPAR) 60 MG tablet Take 60 mg by mouth daily.  . cloNIDine (CATAPRES) 0.1 MG tablet Take 0.1 mg by mouth 2 (two) times daily.  . furosemide (LASIX) 80 MG tablet Take 80 mg by mouth See admin instructions. Take 80 mg by mouth once daily on Sun, Tue, Thur, and Sat (non dialysis days)  . isosorbide mononitrate (IMDUR) 60 MG 24 hr tablet Take 1 tablet (60 mg total) by mouth daily.  Marland Kitchen losartan (COZAAR) 50 MG tablet Take by mouth.  .  meloxicam (MOBIC) 7.5 MG tablet meloxicam 7.5 mg tablet  . multivitamin (RENA-VIT) TABS tablet Take 1 tablet by mouth daily.  . nitroGLYCERIN (NITROSTAT) 0.4 MG SL tablet Place 1 tablet (0.4 mg total) under the tongue every 5 (five) minutes as needed for chest pain.  Marland Kitchen ondansetron (ZOFRAN) 4 MG tablet Take 1 tablet (4 mg total) by mouth every 8 (eight) hours as needed for nausea or vomiting.  Marland Kitchen oxyCODONE-acetaminophen (PERCOCET/ROXICET) 5-325 MG tablet Take 1 tablet by mouth 2 (two) times daily as needed for pain.   . pregabalin (LYRICA) 75 MG capsule Take 1 capsule (75 mg total) by mouth daily.  . ranitidine (ZANTAC) 150 MG tablet Take 1 tablet (150 mg total) by mouth 2 (two) times daily.  Marland Kitchen umeclidinium-vilanterol (ANORO ELLIPTA) 62.5-25 MCG/INH AEPB Inhale 1 puff into the lungs daily.   Facility-Administered Encounter Medications as of 03/04/2018  Medication  . betamethasone acetate-betamethasone sodium phosphate (CELESTONE) injection 3 mg    Activities of Daily Living In your present state of health, do you have any difficulty performing the following activities: 03/04/2018 09/10/2017  Hearing? N Y  Vision? N Y  Difficulty concentrating or making decisions? N N  Walking or climbing stairs? Y Y  Dressing or bathing? N N  Doing errands, shopping? Y N  Preparing Food and eating ? Y -  Comment Son assists with meal prep. Self feeds.  -  Using the Toilet? N -  In the past six months, have you accidently leaked urine? N -  Do you have problems with loss of bowel control? Y -  Comment Wears a daily brief.  Followed by pcp and GI.  -  Managing your Medications? N -  Managing your Finances? N -  Housekeeping or managing your Housekeeping? Y -  Comment Son assists as needed.  -  Some recent data might be hidden    Patient Care Team: McLean-Scocuzza, Nino Glow, MD as PCP - General (Internal Medicine) Minna Merritts, MD as PCP - Cardiology (Cardiology) Alisa Graff, FNP as Nurse  Practitioner (Family Medicine) Minna Merritts, MD as Consulting Physician (Cardiology) Anthonette Legato, MD as Consulting Physician (Nephrology)    Assessment:   This is a routine wellness examination for Shealyn. The goal of the wellness visit is to assist the patient how to close the gaps in care and create a preventative care plan for the patient.   The roster of all physicians providing medical care to patient is listed in the Snapshot section of the chart.  Osteoporosis risk reviewed.    Dialysis 3x weekly.  Safety issues reviewed; Lives with her son. Smoke and carbon monoxide detectors in the home. Firearm safety discussed. Keeps her cell phone on her if home alone. Wears seatbelts when driving or riding with others. She is restricted to daytime driving only due to poor vision. No violence in the home.  They do not have excessive sun exposure.  Discussed  the need for sun protection: hats, long sleeves and the use of sunscreen if there is significant sun exposure.  Patient is alert, normal appearance, oriented to person/place/and time. Correctly identified the president of the [place] and recalls of 2/3 words.  Performs simple calculations and can read correct time from watch face. Displays appropriate judgement.  Son assists with ADL's as needed.     BMI- discussed the importance of a healthy diet, water intake and the benefits of aerobic exercise. Educational material provided.   24 hour diet recall: Healthy diet  Dental- UTD.  Sleep patterns- Sleeps 5-6 hours at night.   Nausea persistent; appointment scheduled with GI on 03/11/18. Appetite ok.  Mammogram complete.   Medications-taking all scheduled medications without issues. Refill for albuterol sent to local pharmacy per her request.   Exercise Activities and Dietary recommendations Current Exercise Habits: Home exercise routine, Type of exercise: walking, Time (Minutes): 15, Frequency (Times/Week): 2, Weekly Exercise  (Minutes/Week): 30, Intensity: Mild  Goals    . Healthy Lifestyle     Fluid restriction of 32 ounces Healthy diet Stay active       Fall Risk Fall Risk  01/05/2018 12/29/2017 09/10/2017 08/11/2017 03/05/2017  Falls in the past year? No No No No No  Number falls in past yr: - - - - -  Injury with Fall? - - - - -  Risk for fall due to : - - - - -  Follow up - - - - -   Depression Screen PHQ 2/9 Scores 01/05/2018 12/29/2017 09/10/2017 08/11/2017  PHQ - 2 Score 0 0 0 0     Cognitive Function     6CIT Screen 03/04/2018 02/24/2017  What Year? 0 points 0 points  What month? 0 points 0 points  What time? 0 points 0 points  Count back from 20 0 points 0 points  Months in reverse 0 points 0 points  Repeat phrase 0 points 0 points  Total Score 0 0    Immunization History  Administered Date(s) Administered  . Influenza Nasal 03/06/2015  . Influenza Split 02/25/2012, 02/07/2013, 02/19/2015, 03/06/2015  . Influenza, High Dose Seasonal PF 02/09/2018  . Influenza,inj,Quad PF,6+ Mos 02/21/2014, 02/24/2017  . PPD Test 11/12/2012  . Pneumococcal Polysaccharide-23 06/09/2010  . Tetanus 06/09/2010   Screening Tests Health Maintenance  Topic Date Due  . FOOT EXAM  12/19/2015  . HEMOGLOBIN A1C  11/20/2016  . MAMMOGRAM  01/06/2018  . OPHTHALMOLOGY EXAM  05/13/2018  . PAP SMEAR  11/15/2019  . TETANUS/TDAP  06/09/2020  . COLONOSCOPY  08/20/2020  . INFLUENZA VACCINE  Completed  . PNEUMOCOCCAL POLYSACCHARIDE VACCINE AGE 62-64 HIGH RISK  Completed  . Hepatitis C Screening  Completed  . HIV Screening  Completed      Plan:   End of life planning; Advance aging; Advanced directives discussed. Copy of current HCPOA/Living Will requested upon completion.    I have personally reviewed and noted the following in the patient's chart:   . Medical and social history . Use of alcohol, tobacco or illicit drugs  . Current medications and supplements . Functional ability and status . Nutritional  status . Physical activity . Advanced directives . List of other physicians . Hospitalizations, surgeries, and ER visits in previous 12 months . Vitals . Screenings to include cognitive, depression, and falls . Referrals and appointments  In addition, I have reviewed and discussed with patient certain preventive protocols, quality metrics, and best practice recommendations. A written personalized care plan for  preventive services as well as general preventive health recommendations were provided to patient.     Varney Biles, LPN  0/05/3934

## 2018-03-05 ENCOUNTER — Telehealth: Payer: Self-pay | Admitting: Internal Medicine

## 2018-03-05 DIAGNOSIS — Z992 Dependence on renal dialysis: Secondary | ICD-10-CM | POA: Diagnosis not present

## 2018-03-05 DIAGNOSIS — D509 Iron deficiency anemia, unspecified: Secondary | ICD-10-CM | POA: Diagnosis not present

## 2018-03-05 DIAGNOSIS — N2581 Secondary hyperparathyroidism of renal origin: Secondary | ICD-10-CM | POA: Diagnosis not present

## 2018-03-05 DIAGNOSIS — N186 End stage renal disease: Secondary | ICD-10-CM | POA: Diagnosis not present

## 2018-03-05 DIAGNOSIS — D631 Anemia in chronic kidney disease: Secondary | ICD-10-CM | POA: Diagnosis not present

## 2018-03-05 NOTE — Telephone Encounter (Signed)
Copied from [place] [id number]. Topic: General - Other >> Mar 05, 2018  3:25 PM Yvette Rack wrote: Reason for CRM: pt calling for lab results

## 2018-03-05 NOTE — Telephone Encounter (Signed)
Documented in result notes. 

## 2018-03-08 DIAGNOSIS — N186 End stage renal disease: Secondary | ICD-10-CM | POA: Diagnosis not present

## 2018-03-08 DIAGNOSIS — N2581 Secondary hyperparathyroidism of renal origin: Secondary | ICD-10-CM | POA: Diagnosis not present

## 2018-03-08 DIAGNOSIS — Z992 Dependence on renal dialysis: Secondary | ICD-10-CM | POA: Diagnosis not present

## 2018-03-08 DIAGNOSIS — D509 Iron deficiency anemia, unspecified: Secondary | ICD-10-CM | POA: Diagnosis not present

## 2018-03-08 DIAGNOSIS — D631 Anemia in chronic kidney disease: Secondary | ICD-10-CM | POA: Diagnosis not present

## 2018-03-09 DIAGNOSIS — D509 Iron deficiency anemia, unspecified: Secondary | ICD-10-CM | POA: Diagnosis not present

## 2018-03-09 DIAGNOSIS — D631 Anemia in chronic kidney disease: Secondary | ICD-10-CM | POA: Diagnosis not present

## 2018-03-09 DIAGNOSIS — N186 End stage renal disease: Secondary | ICD-10-CM | POA: Diagnosis not present

## 2018-03-09 DIAGNOSIS — N2581 Secondary hyperparathyroidism of renal origin: Secondary | ICD-10-CM | POA: Diagnosis not present

## 2018-03-09 DIAGNOSIS — M5136 Other intervertebral disc degeneration, lumbar region: Secondary | ICD-10-CM | POA: Diagnosis not present

## 2018-03-09 DIAGNOSIS — Z992 Dependence on renal dialysis: Secondary | ICD-10-CM | POA: Diagnosis not present

## 2018-03-09 DIAGNOSIS — M5416 Radiculopathy, lumbar region: Secondary | ICD-10-CM | POA: Diagnosis not present

## 2018-03-10 ENCOUNTER — Encounter: Payer: Self-pay | Admitting: *Deleted

## 2018-03-10 DIAGNOSIS — N186 End stage renal disease: Secondary | ICD-10-CM | POA: Diagnosis not present

## 2018-03-10 DIAGNOSIS — Z992 Dependence on renal dialysis: Secondary | ICD-10-CM | POA: Diagnosis not present

## 2018-03-10 DIAGNOSIS — D631 Anemia in chronic kidney disease: Secondary | ICD-10-CM | POA: Diagnosis not present

## 2018-03-10 DIAGNOSIS — D509 Iron deficiency anemia, unspecified: Secondary | ICD-10-CM | POA: Diagnosis not present

## 2018-03-10 DIAGNOSIS — N2581 Secondary hyperparathyroidism of renal origin: Secondary | ICD-10-CM | POA: Diagnosis not present

## 2018-03-11 ENCOUNTER — Encounter: Payer: Self-pay | Admitting: Anesthesiology

## 2018-03-11 ENCOUNTER — Encounter (INDEPENDENT_AMBULATORY_CARE_PROVIDER_SITE_OTHER): Payer: Medicare Other

## 2018-03-11 ENCOUNTER — Ambulatory Visit: Payer: Medicare Other | Admitting: Anesthesiology

## 2018-03-11 ENCOUNTER — Encounter

## 2018-03-11 ENCOUNTER — Ambulatory Visit (INDEPENDENT_AMBULATORY_CARE_PROVIDER_SITE_OTHER): Payer: Medicare Other | Admitting: Vascular Surgery

## 2018-03-11 ENCOUNTER — Encounter
Admission: RE | Disposition: A | Discharge status: Home / Self Care | Payer: Self-pay | Source: Ambulatory Visit | Attending: Gastroenterology

## 2018-03-11 ENCOUNTER — Ambulatory Visit
Admission: RE | Admit: 2018-03-11 | Discharge: 2018-03-11 | Disposition: A | Discharge status: Home / Self Care | Payer: Medicare Other | Source: Ambulatory Visit | Attending: Gastroenterology | Admitting: Gastroenterology

## 2018-03-11 DIAGNOSIS — E1140 Type 2 diabetes mellitus with diabetic neuropathy, unspecified: Secondary | ICD-10-CM | POA: Diagnosis not present

## 2018-03-11 DIAGNOSIS — Z87891 Personal history of nicotine dependence: Secondary | ICD-10-CM | POA: Diagnosis not present

## 2018-03-11 DIAGNOSIS — J449 Chronic obstructive pulmonary disease, unspecified: Secondary | ICD-10-CM | POA: Insufficient documentation

## 2018-03-11 DIAGNOSIS — R112 Nausea with vomiting, unspecified: Secondary | ICD-10-CM | POA: Diagnosis not present

## 2018-03-11 DIAGNOSIS — I252 Old myocardial infarction: Secondary | ICD-10-CM | POA: Diagnosis not present

## 2018-03-11 DIAGNOSIS — E11319 Type 2 diabetes mellitus with unspecified diabetic retinopathy without macular edema: Secondary | ICD-10-CM | POA: Diagnosis not present

## 2018-03-11 DIAGNOSIS — E1122 Type 2 diabetes mellitus with diabetic chronic kidney disease: Secondary | ICD-10-CM | POA: Diagnosis not present

## 2018-03-11 DIAGNOSIS — E785 Hyperlipidemia, unspecified: Secondary | ICD-10-CM | POA: Diagnosis not present

## 2018-03-11 DIAGNOSIS — D573 Sickle-cell trait: Secondary | ICD-10-CM | POA: Insufficient documentation

## 2018-03-11 DIAGNOSIS — I5032 Chronic diastolic (congestive) heart failure: Secondary | ICD-10-CM | POA: Diagnosis not present

## 2018-03-11 DIAGNOSIS — K5909 Other constipation: Secondary | ICD-10-CM | POA: Diagnosis not present

## 2018-03-11 DIAGNOSIS — Z992 Dependence on renal dialysis: Secondary | ICD-10-CM | POA: Diagnosis not present

## 2018-03-11 DIAGNOSIS — K2970 Gastritis, unspecified, without bleeding: Secondary | ICD-10-CM

## 2018-03-11 DIAGNOSIS — I2510 Atherosclerotic heart disease of native coronary artery without angina pectoris: Secondary | ICD-10-CM | POA: Insufficient documentation

## 2018-03-11 DIAGNOSIS — Z955 Presence of coronary angioplasty implant and graft: Secondary | ICD-10-CM | POA: Diagnosis not present

## 2018-03-11 DIAGNOSIS — N186 End stage renal disease: Secondary | ICD-10-CM | POA: Insufficient documentation

## 2018-03-11 DIAGNOSIS — D631 Anemia in chronic kidney disease: Secondary | ICD-10-CM | POA: Diagnosis not present

## 2018-03-11 DIAGNOSIS — E1151 Type 2 diabetes mellitus with diabetic peripheral angiopathy without gangrene: Secondary | ICD-10-CM | POA: Insufficient documentation

## 2018-03-11 DIAGNOSIS — Z7982 Long term (current) use of aspirin: Secondary | ICD-10-CM | POA: Insufficient documentation

## 2018-03-11 DIAGNOSIS — I132 Hypertensive heart and chronic kidney disease with heart failure and with stage 5 chronic kidney disease, or end stage renal disease: Secondary | ICD-10-CM | POA: Diagnosis not present

## 2018-03-11 HISTORY — PX: ESOPHAGOGASTRODUODENOSCOPY (EGD) WITH PROPOFOL: SHX5813

## 2018-03-11 HISTORY — DX: Angina pectoris, unspecified: I20.9

## 2018-03-11 SURGERY — ESOPHAGOGASTRODUODENOSCOPY (EGD) WITH PROPOFOL
Anesthesia: General

## 2018-03-11 MED ORDER — SODIUM CHLORIDE 0.9 % IV SOLN
INTRAVENOUS | Status: DC
  Administered 2018-03-11: 500 mL via INTRAVENOUS

## 2018-03-11 MED ORDER — FENTANYL CITRATE (PF) 100 MCG/2ML IJ SOLN
INTRAMUSCULAR | Status: DC | PRN
  Administered 2018-03-11: 50 ug via INTRAVENOUS

## 2018-03-11 MED ORDER — BUTAMBEN-TETRACAINE-BENZOCAINE 2-2-14 % EX AERO
INHALATION_SPRAY | CUTANEOUS | Status: DC | PRN
  Administered 2018-03-11: 2 via TOPICAL

## 2018-03-11 MED ORDER — FENTANYL CITRATE (PF) 100 MCG/2ML IJ SOLN
INTRAMUSCULAR | Status: AC
  Filled 2018-03-11: qty 2

## 2018-03-11 MED ORDER — PROPOFOL 500 MG/50ML IV EMUL
INTRAVENOUS | Status: AC
  Filled 2018-03-11: qty 50

## 2018-03-11 MED ORDER — PROPOFOL 500 MG/50ML IV EMUL
INTRAVENOUS | Status: DC | PRN
  Administered 2018-03-11: 120 ug/kg/min via INTRAVENOUS

## 2018-03-11 MED ORDER — MIDAZOLAM HCL 2 MG/2ML IJ SOLN
INTRAMUSCULAR | Status: DC | PRN
  Administered 2018-03-11: 2 mg via INTRAVENOUS

## 2018-03-11 MED ORDER — LIDOCAINE HCL (PF) 2 % IJ SOLN
INTRAMUSCULAR | Status: AC
  Filled 2018-03-11: qty 10

## 2018-03-11 MED ORDER — MIDAZOLAM HCL 2 MG/2ML IJ SOLN
INTRAMUSCULAR | Status: AC
  Filled 2018-03-11: qty 2

## 2018-03-11 MED ORDER — LIDOCAINE HCL (PF) 1 % IJ SOLN
INTRAMUSCULAR | Status: AC
  Administered 2018-03-11: 0.3 mL
  Filled 2018-03-11: qty 2

## 2018-03-11 NOTE — Transfer of Care (Signed)
Immediate Anesthesia Transfer of Care Note  Patient: Theresa [name]  Procedure(s) Performed: ESOPHAGOGASTRODUODENOSCOPY (EGD) WITH PROPOFOL (N/A )  Patient Location: PACU  Anesthesia Type:General  Level of Consciousness: awake and sedated  Airway & Oxygen Therapy: Patient Spontanous Breathing and Patient connected to nasal cannula oxygen  Post-op Assessment: Report given to RN and Post -op Vital signs reviewed and stable  Post vital signs: Reviewed and stable  Last Vitals:  Vitals Value Taken Time  BP    Temp    Pulse    Resp    SpO2      Last Pain:  Vitals:   03/11/18 0709  TempSrc: Tympanic         Complications: No apparent anesthesia complications

## 2018-03-11 NOTE — H&P (Signed)
[name], MD [address], [place], [place], [place], [address] [address], [place], [place], [place], [address] Phone: [phone]  Fax: [phone]  Primary Care Physician:  [place]-Scocuzza, Nino Glow, MD   Pre-Procedure History & Physical: HPI:  Theresa [name] is a 62 y.o. female is here for an endoscopy    Past Medical History:  Diagnosis Date  . Anemia of chronic disease   . Anginal pain ([place])   . Carotid arterial disease ([place])    a. 02/2013 U/S: 40-59% bilat ICA stenosis.  . Chronic constipation   . Chronic diastolic CHF (congestive heart failure) ([place])    a. 10/2013 Echo [facility]): EF 55-60%, mod conc LVH, mod MR, mildly dil LA, mild Ao sclerosis w/o stenosis; b. 02/2016 Echo: EF 55-60%, no rwma, Gr DD, mild AS, mod to sev MR, mildly dil LA, PASP 73mmHg; c. 07/2017 Echo [facility]): EF >55%, mild to mod LVH, Gr2 DD, Ao scl, Mod MR, mod dil LA, nl RV fxn.  . Colon polyps   . COPD (chronic obstructive pulmonary disease) ([place])   . Coronary artery disease    a. 05/2013 NSTEMI/PCI: LM 20d, LAD min irregs, LCX small, nl, OM1 nl, RCA dom 87m (2.5x16 Promus DES), PDA1 80p; b. 04/2015 Cath: LM nl, LAD 75m, D1/2 min irregs, LCX 35p/m, OM2/3 min irregs, RCA patent mid stent, RPDA 70ost, RPLB1 30, RPLB2/3 min irregs, EF 55-65%--> Med Rx; c. 06/2017 MV [facility]): No ischemia/infarct, EF 64%.  . Diabetes mellitus   . Diabetic neuropathy ([place])   . Diabetic retinopathy ([place]) 05/28/2013   Hx bilat retinal detachment, proliferative diab retinopathy and bilat vitreous hemorrhage   . Emphysema   . ESRD on hemodialysis ([place])    a. [facility] in [place], [place] [phone]/Dr. [name], on a MWF schedule.  She started dialysis in Feb 2014.  Etiology of renal failure not known, likely diabetes.  Has a left upper arm AV graft.  . History of bronchitis    Mar 2012  . History of pneumonia    June 2012  . History of tobacco abuse    a. Quit 2012.  Marland Kitchen Hyperlipidemia   . Hypertension   .  Moderate to severe mitral insufficiency    a. 10/2013 Echo: EF 55-60%, mod MR; b. 02/2016 Echo: EF 55-60%, mod to sev MR directed posteriorly--felt to be dynamic -worse with volume overload; c. 07/2017 Echo [facility]): EF >55%. Mod MR.  . Myocardial infarct ([place]) 05/2013  . Peripheral vascular disease ([place])   . Sickle cell trait ([place])   . Thyroid nodule    [place] 06/2017 due to f/u US in 1 year     Past Surgical History:  Procedure Laterality Date  . A/V FISTULAGRAM Left 11/10/2017   Procedure: A/V FISTULAGRAM;  Surgeon: Katha Cabal, MD;  Location: [place] [facility];  Service: Cardiovascular;  Laterality: Left;  . ABDOMINAL HYSTERECTOMY     2000  . CARDIAC CATHETERIZATION    . CARDIAC CATHETERIZATION N/A 05/02/2015   Procedure: Left Heart Cath and Coronary Angiography;  Surgeon: [name], MD;  Location: [place] [facility];  Service: Cardiovascular;  Laterality: N/A;  . COLONOSCOPY WITH PROPOFOL N/A 07/08/2016   Procedure: COLONOSCOPY WITH PROPOFOL;  Surgeon: [name], MD;  Location: [facility] END[facility];  Service: Endoscopy;  Laterality: N/A;  . COLONOSCOPY WITH PROPOFOL N/A 08/20/2017   Procedure: COLONOSCOPY WITH PROPOFOL;  Surgeon: [name], MD;  Location: [facility];  Service: Gastroenterology;  Laterality: N/A;  . colonscopy    . CORONARY ANGIOPLASTY  05/28/2014   stent placement to the mid RCA  . DILATION AND CURETTAGE OF UTERUS     several in the early 80's  . ESOPHAGOGASTRODUODENOSCOPY     2012  . EYE SURGERY     bilateral laser 2012  . EYE SURGERY     right  . EYE SURGERY     x4 both eyes  . GAS INSERTION  09/30/2011   Procedure: INSERTION OF GAS;  Surgeon: [name], MD;  Location: [facility];  Service: Ophthalmology;  Laterality: Right;  C3F8  . GAS/FLUID EXCHANGE  09/30/2011   Procedure: GAS/FLUID EXCHANGE;  Surgeon: [name], MD;  Location: [place] [facility];  Service: Ophthalmology;  Laterality: Right;  . LEFT HEART CATHETERIZATION WITH CORONARY ANGIOGRAM N/A  05/28/2013   Procedure: LEFT HEART CATHETERIZATION WITH CORONARY ANGIOGRAM;  Surgeon: [name], MD;  Location: [facility] CATH LAB;  Service: Cardiovascular;  Laterality: N/A;  . PARS PLANA VITRECTOMY  04/22/2011   Procedure: PARS PLANA VITRECTOMY WITH 25 GAUGE;  Surgeon: [name], MD;  Location: [facility];  Service: Ophthalmology;  Laterality: Left;  membrane peel, endolaser, gas fluid exchange, silicone oil, repair of complex traction retinal detachment  . PARS PLANA VITRECTOMY  09/30/2011   Procedure: PARS PLANA VITRECTOMY WITH 25 GAUGE;  Surgeon: [name], MD;  Location: [place];  Service: Ophthalmology;  Laterality: Right;  Endolaser; Repair of Complex Traction Retinal Detachment  . PARS PLANA VITRECTOMY  02/24/2012   Procedure: PARS PLANA VITRECTOMY WITH 25 GAUGE;  Surgeon: [name], MD;  Location: [place];  Service: Ophthalmology;  Laterality: Left;  . PTCA    . SILICON OIL REMOVAL  06/27/1476   Procedure: SILICON OIL REMOVAL;  Surgeon: [name], MD;  Location: [facility];  Service: Ophthalmology;  Laterality: Left;  . THROMBECTOMY / ARTERIOVENOUS GRAFT REVISION    . TUBAL LIGATION     1979    Prior to Admission medications   Medication Sig Start Date End Date Taking? Authorizing Provider  albuterol (PROVENTIL) (2.5 MG/3ML) 0.083% nebulizer solution Take 2.5 mg by nebulization every 6 (six) hours as needed for wheezing or shortness of breath.   Yes [provider]  albuterol (VENTOLIN HFA) 108 (90 Base) MCG/ACT inhaler Inhale into the lungs every 6 (six) hours as needed for wheezing or shortness of breath.    [provider]  amLODipine (NORVASC) 10 MG tablet Take 10 mg by mouth daily.  09/17/16   [provider]  aspirin EC 81 MG tablet Take 81 mg by mouth at bedtime.     [provider]  atorvastatin (LIPITOR) 40 MG tablet Take 1 tablet (40 mg total) by mouth daily. At night 01/05/18   [place]-Scocuzza, Nino Glow, MD  AURYXIA 1 GM 210  MG(Fe) tablet Take 420-630 mg by mouth 3 (three) times daily with meals. Take 630 mg with each meal and 420 mg with each snack 09/17/17   [provider]  carvedilol (COREG) 12.5 MG tablet Take 1.5 tablets (18.75 mg) by mouth twice daily 02/16/18   Theora Gianotti, NP  cinacalcet (SENSIPAR) 60 MG tablet Take 60 mg by mouth daily.    [provider]  cloNIDine (CATAPRES) 0.1 MG tablet Take 0.1 mg by mouth 2 (two) times daily.    [provider]  furosemide (LASIX) 80 MG tablet Take 80 mg by mouth See admin instructions. Take  80 mg by mouth once daily on Sun, Tue, Thur, and Sat (non dialysis days)    [provider]  isosorbide mononitrate (IMDUR) 60 MG 24 hr tablet Take 1 tablet (60 mg total) by mouth daily. 07/28/17   Max Sane, MD  losartan (COZAAR) 50 MG tablet Take by mouth. 12/12/17   [provider]  meloxicam (MOBIC) 7.5 MG tablet meloxicam 7.5 mg tablet    [provider]  multivitamin (RENA-VIT) TABS tablet Take 1 tablet by mouth daily.    [provider]  nitroGLYCERIN (NITROSTAT) 0.4 MG SL tablet Place 1 tablet (0.4 mg total) under the tongue every 5 (five) minutes as needed for chest pain. 01/15/17   Burnard Hawthorne, FNP  ondansetron (ZOFRAN) 4 MG tablet Take 1 tablet (4 mg total) by mouth every 8 (eight) hours as needed for nausea or vomiting. 01/05/18   [facility]-Scocuzza, Nino Glow, MD  oxyCODONE-acetaminophen (PERCOCET/ROXICET) 5-325 MG tablet Take 1 tablet by mouth 2 (two) times daily as needed for pain.  10/04/17   [provider]  pregabalin (LYRICA) 75 MG capsule Take 1 capsule (75 mg total) by mouth daily. 04/21/16   Burnard Hawthorne, FNP  ranitidine (ZANTAC) 150 MG tablet Take 1 tablet (150 mg total) by mouth 2 (two) times daily. 02/23/18 04/25/18  [name], MD  umeclidinium-vilanterol [facility] ELLIPTA) 62.5-25 MCG/INH AEPB Inhale 1 puff into the lungs daily. 12/29/17   [facility]-Scocuzza, Nino Glow, MD     Allergies as of 02/23/2018 - Review Complete 02/23/2018  Allergen Reaction Noted  . Hydrocodone Hives 08/20/2012  . Metformin Diarrhea and Other (See Comments) 10/26/2013  . Lisinopril  03/11/2017    Family History  Problem Relation Age of Onset  . Stroke Mother   . Heart attack Mother   . Heart disease Mother   . Colon cancer Father   . Colon cancer Sister   . Heart attack Brother   . Stroke Brother   . Diabetes Brother   . Breast cancer Sister   . Diabetes Brother   . Diabetes Daughter   . Anesthesia problems Neg Hx   . Hypotension Neg Hx   . Malignant hyperthermia Neg Hx   . Pseudochol deficiency Neg Hx     Social History   Socioeconomic History  . Marital status: Single    Spouse name: Not on file  . Number of children: 2  . Years of education: Not on file  . Highest education level: Not on file  Occupational History  . Not on file  Social Needs  . Financial resource strain: Not on file  . Food insecurity:    Worry: Not on file    Inability: Not on file  . Transportation needs:    Medical: Not on file    Non-medical: Not on file  Tobacco Use  . Smoking status: Former Smoker    Packs/day: 1.00    Years: 25.00    Pack years: 25.00    Last attempt to quit: 06/09/2010    Years since quitting: 7.7  . Smokeless tobacco: Never Used  Substance and Sexual Activity  . Alcohol use: No  . Drug use: No  . Sexual activity: Never  Lifestyle  . Physical activity:    Days per week: Not on file    Minutes per session: Not on file  . Stress: Not on file  Relationships  . Social connections:    Talks on phone: Not on file    Gets together: Not on  file    Attends religious service: Not on file    Active member of club or organization: Not on file    Attends meetings of clubs or organizations: Not on file    Relationship status: Not on file  . Intimate partner violence:    Fear of current or ex partner: Not on file    Emotionally abused: Not on file     Physically abused: Not on file    Forced sexual activity: Not on file  Other Topics Concern  . Not on file  Social History Narrative   On disability.    Lives with son Brenton Grills   2 children (has son and daughter)      Son drives her since her vision has decreased    Review of Systems: See HPI, otherwise negative ROS  Physical Exam: BP 128/65   Pulse 61   Temp (!) 96.6 F (35.9 C) (Tympanic)   Resp 17   Ht 5' 7.5" (1.715 m)   Wt 75.3 kg   SpO2 97%   BMI 25.62 kg/m  General:   Alert,  pleasant and cooperative in NAD Head:  Normocephalic and atraumatic. Neck:  Supple; no masses or thyromegaly. Lungs:  Clear throughout to auscultation, normal respiratory effort.    Heart:  +S1, +S2, Regular rate and rhythm, No edema. Abdomen:  Soft, nontender and nondistended. Normal bowel sounds, without guarding, and without rebound.   Neurologic:  Alert and  oriented x4;  grossly normal neurologically.  Impression/Plan: Theresa [name] is here for an endoscopy  to be performed for  evaluation of nausea and vomiting    Risks, benefits, limitations, and alternatives regarding endoscopy have been reviewed with the patient.  Questions have been answered.  All parties agreeable.   [name], MD  03/11/2018, 8:07 AM

## 2018-03-11 NOTE — Anesthesia Postprocedure Evaluation (Signed)
Anesthesia Post Note  Patient: Theresa [name]  Procedure(s) Performed: ESOPHAGOGASTRODUODENOSCOPY (EGD) WITH PROPOFOL (N/A )  Patient location during evaluation: Endoscopy Anesthesia Type: General Level of consciousness: awake and alert Pain management: pain level controlled Vital Signs Assessment: post-procedure vital signs reviewed and stable Respiratory status: spontaneous breathing, nonlabored ventilation, respiratory function stable and patient connected to nasal cannula oxygen Cardiovascular status: blood pressure returned to baseline and stable Postop Assessment: no apparent nausea or vomiting Anesthetic complications: no     Last Vitals:  Vitals:   03/11/18 0834 03/11/18 0854  BP: (!) 154/67 (!) 163/69  Pulse:    Resp:    Temp:    SpO2:      Last Pain:  Vitals:   03/11/18 0854  TempSrc:   PainSc: 0-No pain                 Aparna Vanderweele S

## 2018-03-11 NOTE — Anesthesia Post-op Follow-up Note (Signed)
Anesthesia QCDR form completed.        

## 2018-03-11 NOTE — Op Note (Signed)
[facility] Gastroenterology Patient Name: Theresa [name] Procedure Date: 03/11/2018 8:10 AM MRN: [id number] Account #: [id number] Date of Birth: 1956/02/13 Admit Type: Outpatient Age: 62 Room: [facility] ENDO ROOM 4 Gender: Female Note Status: Finalized Procedure:            Upper GI endoscopy Indications:          Nausea with vomiting Providers:            [name] MD, MD Referring MD:         No Local Md, MD (Referring MD) Medicines:            Monitored Anesthesia Care Complications:        No immediate complications. Procedure:            Pre-Anesthesia Assessment:                       - Prior to the procedure, a History and Physical was                        performed, and patient medications, allergies and                        sensitivities were reviewed. The patient's tolerance of                        previous anesthesia was reviewed.                       - The risks and benefits of the procedure and the                        sedation options and risks were discussed with the                        patient. All questions were answered and informed                        consent was obtained.                       - ASA Grade Assessment: III - A patient with severe                        systemic disease.                       After obtaining informed consent, the endoscope was                        passed under direct vision. Throughout the procedure,                        the patient's blood pressure, pulse, and oxygen                        saturations were monitored continuously. The Endoscope                        was introduced through the mouth, and advanced to the  third part of duodenum. The upper GI endoscopy was                        accomplished without difficulty. The patient tolerated                        the procedure well. Findings:      The examined esophagus was normal.      The examined duodenum was  normal.      Patchy moderate inflammation characterized by congestion (edema),       erosions and erythema was found in the gastric antrum. Biopsies were       taken with a cold forceps for histology.      The cardia and gastric fundus were normal on retroflexion. Impression:           - Normal esophagus.                       - Normal examined duodenum.                       - Gastritis. Biopsied. Recommendation:       - Discharge patient to home (with escort).                       - Resume previous diet.                       - Continue present medications.                       - Await pathology results.                       - Return to my office in 2 weeks. Procedure Code(s):    --- Professional ---                       [id number], Esophagogastroduodenoscopy, flexible, transoral;                        with biopsy, single or multiple Diagnosis Code(s):    --- Professional ---                       K29.70, Gastritis, unspecified, without bleeding                       R11.2, Nausea with vomiting, unspecified CPT copyright 2017 American Medical Association. All rights reserved. The codes documented in this report are preliminary and upon coder review may  be revised to meet current compliance requirements. [name], MD [name] MD, MD 03/11/2018 8:21:27 AM This report has been signed electronically. Number of Addenda: 0 Note Initiated On: 03/11/2018 8:10 AM      [facility]

## 2018-03-11 NOTE — Anesthesia Procedure Notes (Signed)
Performed by: Cook-Martin, Manan Olmo Pre-anesthesia Checklist: Patient identified, Emergency Drugs available, Suction available, Patient being monitored and Timeout performed Patient Re-evaluated:Patient Re-evaluated prior to induction Oxygen Delivery Method: Nasal cannula Preoxygenation: Pre-oxygenation with 100% oxygen Induction Type: IV induction Placement Confirmation: CO2 detector and positive ETCO2       

## 2018-03-11 NOTE — Anesthesia Preprocedure Evaluation (Addendum)
Anesthesia Evaluation  Patient identified by MRN, date of birth, ID band Patient awake    Reviewed: Allergy & Precautions, NPO status , Patient's Chart, lab work & pertinent test results, reviewed documented beta blocker date and time   Airway Mallampati: II  TM Distance: >3 FB     Dental  (+) Chipped   Pulmonary COPD, former smoker,           Cardiovascular hypertension, Pt. on medications and Pt. on home beta blockers + angina + CAD, + Past MI, + Cardiac Stents, + Peripheral Vascular Disease and +CHF       Neuro/Psych  Neuromuscular disease    GI/Hepatic   Endo/Other  diabetes, Type 2  Renal/GU ESRFRenal disease     Musculoskeletal  (+) Arthritis ,   Abdominal   Peds  Hematology  (+) anemia ,   Anesthesia Other Findings Low sats 95%. EF 55-60.  Reproductive/Obstetrics                            Anesthesia Physical Anesthesia Plan  ASA: III  Anesthesia Plan: General   Post-op Pain Management:    Induction: Intravenous  PONV Risk Score and Plan:   Airway Management Planned:   Additional Equipment:   Intra-op Plan:   Post-operative Plan:   Informed Consent: I have reviewed the patients History and Physical, chart, labs and discussed the procedure including the risks, benefits and alternatives for the proposed anesthesia with the patient or authorized representative who has indicated his/her understanding and acceptance.     Plan Discussed with: CRNA  Anesthesia Plan Comments:         Anesthesia Quick Evaluation

## 2018-03-12 DIAGNOSIS — N186 End stage renal disease: Secondary | ICD-10-CM | POA: Diagnosis not present

## 2018-03-12 DIAGNOSIS — D509 Iron deficiency anemia, unspecified: Secondary | ICD-10-CM | POA: Diagnosis not present

## 2018-03-12 DIAGNOSIS — D631 Anemia in chronic kidney disease: Secondary | ICD-10-CM | POA: Diagnosis not present

## 2018-03-12 DIAGNOSIS — N2581 Secondary hyperparathyroidism of renal origin: Secondary | ICD-10-CM | POA: Diagnosis not present

## 2018-03-12 DIAGNOSIS — Z992 Dependence on renal dialysis: Secondary | ICD-10-CM | POA: Diagnosis not present

## 2018-03-13 ENCOUNTER — Encounter: Payer: Self-pay | Admitting: Gastroenterology

## 2018-03-15 DIAGNOSIS — Z992 Dependence on renal dialysis: Secondary | ICD-10-CM | POA: Diagnosis not present

## 2018-03-15 DIAGNOSIS — D631 Anemia in chronic kidney disease: Secondary | ICD-10-CM | POA: Diagnosis not present

## 2018-03-15 DIAGNOSIS — N2581 Secondary hyperparathyroidism of renal origin: Secondary | ICD-10-CM | POA: Diagnosis not present

## 2018-03-15 DIAGNOSIS — N186 End stage renal disease: Secondary | ICD-10-CM | POA: Diagnosis not present

## 2018-03-15 DIAGNOSIS — D509 Iron deficiency anemia, unspecified: Secondary | ICD-10-CM | POA: Diagnosis not present

## 2018-03-15 LAB — SURGICAL PATHOLOGY

## 2018-03-17 DIAGNOSIS — Z992 Dependence on renal dialysis: Secondary | ICD-10-CM | POA: Diagnosis not present

## 2018-03-17 DIAGNOSIS — N186 End stage renal disease: Secondary | ICD-10-CM | POA: Diagnosis not present

## 2018-03-17 DIAGNOSIS — N2581 Secondary hyperparathyroidism of renal origin: Secondary | ICD-10-CM | POA: Diagnosis not present

## 2018-03-17 DIAGNOSIS — D509 Iron deficiency anemia, unspecified: Secondary | ICD-10-CM | POA: Diagnosis not present

## 2018-03-17 DIAGNOSIS — D631 Anemia in chronic kidney disease: Secondary | ICD-10-CM | POA: Diagnosis not present

## 2018-03-18 ENCOUNTER — Other Ambulatory Visit: Payer: Self-pay | Admitting: Internal Medicine

## 2018-03-19 DIAGNOSIS — N186 End stage renal disease: Secondary | ICD-10-CM | POA: Diagnosis not present

## 2018-03-19 DIAGNOSIS — Z992 Dependence on renal dialysis: Secondary | ICD-10-CM | POA: Diagnosis not present

## 2018-03-19 DIAGNOSIS — D509 Iron deficiency anemia, unspecified: Secondary | ICD-10-CM | POA: Diagnosis not present

## 2018-03-19 DIAGNOSIS — D631 Anemia in chronic kidney disease: Secondary | ICD-10-CM | POA: Diagnosis not present

## 2018-03-19 DIAGNOSIS — N2581 Secondary hyperparathyroidism of renal origin: Secondary | ICD-10-CM | POA: Diagnosis not present

## 2018-03-21 ENCOUNTER — Encounter: Payer: Self-pay | Admitting: Gastroenterology

## 2018-03-21 DIAGNOSIS — N2581 Secondary hyperparathyroidism of renal origin: Secondary | ICD-10-CM | POA: Diagnosis not present

## 2018-03-21 DIAGNOSIS — Z992 Dependence on renal dialysis: Secondary | ICD-10-CM | POA: Diagnosis not present

## 2018-03-21 DIAGNOSIS — D631 Anemia in chronic kidney disease: Secondary | ICD-10-CM | POA: Diagnosis not present

## 2018-03-21 DIAGNOSIS — D509 Iron deficiency anemia, unspecified: Secondary | ICD-10-CM | POA: Diagnosis not present

## 2018-03-21 DIAGNOSIS — N186 End stage renal disease: Secondary | ICD-10-CM | POA: Diagnosis not present

## 2018-03-22 ENCOUNTER — Encounter: Admit: 2018-03-22 | Discharge: 2018-03-22 | Payer: MEDICARE | Attending: Nephrology | Primary: Nephrology

## 2018-03-22 DIAGNOSIS — E119 Type 2 diabetes mellitus without complications: Secondary | ICD-10-CM | POA: Diagnosis not present

## 2018-03-22 DIAGNOSIS — D509 Iron deficiency anemia, unspecified: Secondary | ICD-10-CM | POA: Diagnosis not present

## 2018-03-22 DIAGNOSIS — D631 Anemia in chronic kidney disease: Secondary | ICD-10-CM | POA: Diagnosis not present

## 2018-03-22 DIAGNOSIS — Z992 Dependence on renal dialysis: Secondary | ICD-10-CM | POA: Diagnosis not present

## 2018-03-22 DIAGNOSIS — N186 End stage renal disease: Secondary | ICD-10-CM | POA: Diagnosis not present

## 2018-03-22 DIAGNOSIS — N2581 Secondary hyperparathyroidism of renal origin: Secondary | ICD-10-CM | POA: Diagnosis not present

## 2018-03-23 ENCOUNTER — Ambulatory Visit (INDEPENDENT_AMBULATORY_CARE_PROVIDER_SITE_OTHER): Payer: Medicare Other | Admitting: Internal Medicine

## 2018-03-23 ENCOUNTER — Encounter: Payer: Self-pay | Admitting: Internal Medicine

## 2018-03-23 VITALS — BP 140/58 | HR 70 | Temp 98.2°F | Ht 67.5 in | Wt 164.4 lb

## 2018-03-23 DIAGNOSIS — R112 Nausea with vomiting, unspecified: Secondary | ICD-10-CM | POA: Diagnosis not present

## 2018-03-23 DIAGNOSIS — I10 Essential (primary) hypertension: Secondary | ICD-10-CM

## 2018-03-23 DIAGNOSIS — E041 Nontoxic single thyroid nodule: Secondary | ICD-10-CM

## 2018-03-23 DIAGNOSIS — Z992 Dependence on renal dialysis: Secondary | ICD-10-CM

## 2018-03-23 DIAGNOSIS — N186 End stage renal disease: Secondary | ICD-10-CM

## 2018-03-23 DIAGNOSIS — Z23 Encounter for immunization: Secondary | ICD-10-CM | POA: Diagnosis not present

## 2018-03-23 NOTE — Progress Notes (Signed)
Chief Complaint  Patient presents with  . Follow-up   F/u  1. Filled out disability paperwork  2.HTN controlled on coreg 18.75 mg bid, lasix 80 mg qd, imdur 60, clonidine 0.1 bid, norvasc 10 mg qd, losartan 50 mg qd  3. ESRD on HD with left arm AVF 4. Chronic nausea EGD 03/11/18 negative gastritis + hypertrophy of glands she still has not heard from GI re report on prn zofran and zantac I advised pt to also disc chronic nausea with renal as well as GI. sxs controlled with meds.    Review of Systems  Constitutional: Negative for weight loss.  HENT: Negative for hearing loss.   Eyes: Negative for blurred vision.  Respiratory: Negative for shortness of breath.   Cardiovascular: Negative for chest pain.  Musculoskeletal: Negative for falls.  Skin: Negative for rash.  Neurological: Negative for headaches.  Psychiatric/Behavioral: Negative for depression.   Past Medical History:  Diagnosis Date  . Anemia of chronic disease   . Anginal pain ([place])   . Carotid arterial disease ([place])    a. 02/2013 U/S: 40-59% bilat ICA stenosis.  . Chronic constipation   . Chronic diastolic CHF (congestive heart failure) ([place])    a. 10/2013 Echo [facility]): EF 55-60%, mod conc LVH, mod MR, mildly dil LA, mild Ao sclerosis w/o stenosis; b. 02/2016 Echo: EF 55-60%, no rwma, Gr DD, mild AS, mod to sev MR, mildly dil LA, PASP 47mHg; c. 07/2017 Echo ([facility]: EF >55%, mild to mod LVH, Gr2 DD, Ao scl, Mod MR, mod dil LA, nl RV fxn.  . Colon polyps   . COPD (chronic obstructive pulmonary disease) ([place]   . Coronary artery disease    a. 05/2013 NSTEMI/PCI: LM 20d, LAD min irregs, LCX small, nl, OM1 nl, RCA dom 972m2.5x16 Promus DES), PDA1 80p; b. 04/2015 Cath: LM nl, LAD 3046m1/2 min irregs, LCX 35p/m, OM2/3 min irregs, RCA patent mid stent, RPDA 70ost, RPLB1 30, RPLB2/3 min irregs, EF 55-65%--> Med Rx; c. [facility] 64%.  . Diabetes mellitus   . Diabetic neuropathy (HCC[place] . Diabetic  retinopathy (HCCKimberly2/20/2014   Hx bilat retinal detachment, proliferative diab retinopathy and bilat vitreous hemorrhage   . Emphysema   . ESRD [place]ialysis ([place]  a. [facility] [place] [phone]/Dr. [name], on a MWF schedule.  She started dialysis in Feb 2014.  Etiology of renal failure not known, likely diabetes.  Has a left upper arm AV graft.  . History of bronchitis    Mar 2012  . History of pneumonia    June 2012  . History of tobacco abuse    a. Quit 2012.  . HMarland Kitchenperlipidemia   . Hypertension   . Moderate to severe mitral insufficiency    a. 10/2013 Echo: EF 55-60%, mod MR; b. 02/2016 Echo: EF 55-60%, mod to sev MR directed posteriorly--felt to be dynamic -worse with volume overload; c. [facility]2017 [facility] Mod MR.  . Myocardial infarct (HCCC[place]2/2014  . Peripheral vascular disease (HCCMaceo . Sickle cell trait (HCCLeavitt[place] . Thyroid nodule    [place] due to f/u [place] 1 year    Past Surgical History:  Procedure Laterality Date  . A/V FISTULAGRAM Left 11/10/2017   Procedure: A/V FISTULAGRAM;  Surgeon: SchKatha CabalD;  [facility];  Service: Cardiovascular;  Laterality: Left;  . ABDOMINAL HYSTERECTOMY     2000  .  CARDIAC CATHETERIZATION    . CARDIAC CATHETERIZATION N/A 05/02/2015   Procedure: Left Heart Cath and Coronary Angiography;  Surgeon: [name], MD;  Location: [place] CV LAB;  Service: Cardiovascular;  Laterality: N/A;  . COLONOSCOPY WITH PROPOFOL N/A 07/08/2016   Procedure: COLONOSCOPY WITH PROPOFOL;  Surgeon: [name], MD;  Location: [facility] ENDOSCOPY;  Service: Endoscopy;  Laterality: N/A;  . COLONOSCOPY WITH PROPOFOL N/A 08/20/2017   Procedure: COLONOSCOPY WITH PROPOFOL;  Surgeon: [name], MD;  Location: [facility] ENDOSCOPY;  Service: Gastroenterology;  Laterality: N/A;  . colonscopy    . CORONARY ANGIOPLASTY  05/28/2014   stent placement to the mid RCA  . DILATION AND CURETTAGE OF UTERUS     several in the early  80's  . ESOPHAGOGASTRODUODENOSCOPY     2012  . ESOPHAGOGASTRODUODENOSCOPY (EGD) WITH PROPOFOL N/A 03/11/2018   Procedure: ESOPHAGOGASTRODUODENOSCOPY (EGD) WITH PROPOFOL;  Surgeon: [name], MD;  Location: [facility] ENDOSCOPY;  Service: Gastroenterology;  Laterality: N/A;  . EYE SURGERY     bilateral laser 2012  . EYE SURGERY     right  . EYE SURGERY     x4 both eyes  . GAS INSERTION  09/30/2011   Procedure: INSERTION OF GAS;  Surgeon: [name], MD;  Location: [place];  Service: Ophthalmology;  Laterality: Right;  C3F8  . GAS/FLUID EXCHANGE  09/30/2011   Procedure: GAS/FLUID EXCHANGE;  Surgeon: [name], MD;  Location: [place];  Service: Ophthalmology;  Laterality: Right;  . LEFT HEART CATHETERIZATION WITH CORONARY ANGIOGRAM N/A 05/28/2013   Procedure: LEFT HEART CATHETERIZATION WITH CORONARY ANGIOGRAM;  Surgeon: [name], MD;  Location: [facility] CATH LAB;  Service: Cardiovascular;  Laterality: N/A;  . PARS PLANA VITRECTOMY  04/22/2011   Procedure: PARS PLANA VITRECTOMY WITH 25 GAUGE;  Surgeon: [name], MD;  Location: [place];  Service: Ophthalmology;  Laterality: Left;  membrane peel, endolaser, gas fluid exchange, silicone oil, repair of complex traction retinal detachment  . PARS PLANA VITRECTOMY  09/30/2011   Procedure: PARS PLANA VITRECTOMY WITH 25 GAUGE;  Surgeon: [name], MD;  Location: [place];  Service: Ophthalmology;  Laterality: Right;  Endolaser; Repair of Complex Traction Retinal Detachment  . PARS PLANA VITRECTOMY  02/24/2012   Procedure: PARS PLANA VITRECTOMY WITH 25 GAUGE;  Surgeon: [name], MD;  Location: [place];  Service: Ophthalmology;  Laterality: Left;  . PTCA    . SILICON OIL REMOVAL  9/47/6546   Procedure: SILICON OIL REMOVAL;  Surgeon: [name], MD;  Location: [place];  Service: Ophthalmology;  Laterality: Left;  . THROMBECTOMY / ARTERIOVENOUS GRAFT REVISION    . TUBAL LIGATION     1979   Family History  Problem Relation Age of  Onset  . Stroke Mother   . Heart attack Mother   . Heart disease Mother   . Colon cancer Father   . Colon cancer Sister   . Heart attack Brother   . Stroke Brother   . Diabetes Brother   . Breast cancer Sister   . Diabetes Brother   . Diabetes Daughter   . Anesthesia problems Neg Hx   . Hypotension Neg Hx   . Malignant hyperthermia Neg Hx   . Pseudochol deficiency Neg Hx    Social History   Socioeconomic History  . Marital status: Single    Spouse name: Not on file  . Number of children: 2  . Years of education: Not on file  . Highest education  level: Not on file  Occupational History  . Not on file  Social Needs  . Financial resource strain: Not on file  . Food insecurity:    Worry: Not on file    Inability: Not on file  . Transportation needs:    Medical: Not on file    Non-medical: Not on file  Tobacco Use  . Smoking status: Former Smoker    Packs/day: 1.00    Years: 25.00    Pack years: 25.00    Last attempt to quit: 06/09/2010    Years since quitting: 7.7  . Smokeless tobacco: Never Used  Substance and Sexual Activity  . Alcohol use: No  . Drug use: No  . Sexual activity: Never  Lifestyle  . Physical activity:    Days per week: Not on file    Minutes per session: Not on file  . Stress: Not on file  Relationships  . Social connections:    Talks on phone: Not on file    Gets together: Not on file    Attends religious service: Not on file    Active member of club or organization: Not on file    Attends meetings of clubs or organizations: Not on file    Relationship status: Not on file  . Intimate partner violence:    Fear of current or ex partner: Not on file    Emotionally abused: Not on file    Physically abused: Not on file    Forced sexual activity: Not on file  Other Topics Concern  . Not on file  Social History Narrative   On disability.    Lives with son Brenton Grills   2 children (has son and daughter)      Pandora Leiter drives her since her vision has  decreased   No outpatient medications have been marked as taking for the 03/23/18 encounter (Office Visit) with McLean-Scocuzza, Nino Glow, MD.   Current Facility-Administered Medications for the 03/23/18 encounter (Office Visit) with McLean-Scocuzza, Nino Glow, MD  Medication  . betamethasone acetate-betamethasone sodium phosphate (CELESTONE) injection 3 mg   Allergies  Allergen Reactions  . Hydrocodone Hives  . Metformin Diarrhea and Other (See Comments)    Other reaction(s): Distress (finding)  . Lisinopril     Unknown reaction    Recent Results (from the past 2160 hour(s))  POCT occult blood stool     Status: Normal   Collection Time: 01/05/18 11:00 AM  Result Value Ref Range   Fecal Occult Blood, POC  Negative   Card #1 Date     Card #2 Fecal Occult Blod, POC     Card #2 Date     Card #3 Fecal Occult Blood, POC     Card #3 Date    CBC with Differential/Platelet     Status: Abnormal   Collection Time: 01/12/18  4:38 PM  Result Value Ref Range   WBC 10.6 3.6 - 11.0 K/uL   RBC 2.41 (L) 3.80 - 5.20 MIL/uL   Hemoglobin 8.8 (L) 12.0 - 16.0 g/dL   HCT 25.3 (L) 35.0 - 47.0 %   MCV 104.7 (H) 80.0 - 100.0 fL   MCH 36.6 (H) 26.0 - 34.0 pg   MCHC 34.9 32.0 - 36.0 g/dL   RDW 19.7 (H) 11.5 - 14.5 %   Platelets 212 150 - 440 K/uL   Neutrophils Relative % 80 %   Neutro Abs 8.6 (H) 1.4 - 6.5 K/uL   Lymphocytes Relative 12 %   Lymphs Abs  1.2 1.0 - 3.6 K/uL   Monocytes Relative 4 %   Monocytes Absolute 0.4 0.2 - 0.9 K/uL   Eosinophils Relative 3 %   Eosinophils Absolute 0.3 0 - 0.7 K/uL   Basophils Relative 1 %   Basophils Absolute 0.1 0 - 0.1 K/uL    Comment: Performed at [facility], [address] [place], [place] [address]  Comprehensive metabolic panel     Status: Abnormal   Collection Time: 01/12/18  4:38 PM  Result Value Ref Range   Sodium 141 135 - 145 mmol/L   Potassium 3.9 3.5 - 5.1 mmol/L    Comment: HEMOLYSIS AT THIS LEVEL MAY AFFECT RESULT   Chloride 98  98 - 111 mmol/L   CO2 34 (H) 22 - 32 mmol/L   Glucose, Bld 142 (H) 70 - 99 mg/dL   BUN 18 6 - 20 mg/dL   Creatinine, Ser 6.77 (H) 0.44 - 1.00 mg/dL   Calcium 8.3 (L) 8.9 - 10.3 mg/dL   Total Protein 6.3 (L) 6.5 - 8.1 g/dL   Albumin 3.5 3.5 - 5.0 g/dL   AST 33 15 - 41 U/L   ALT 14 0 - 44 U/L   Alkaline Phosphatase 150 (H) 38 - 126 U/L   Total Bilirubin 1.0 0.3 - 1.2 mg/dL   GFR calc non Af Amer 6 (L) >60 mL/min   GFR calc Af Amer 7 (L) >60 mL/min    Comment: (NOTE) The eGFR has been calculated using the CKD EPI equation. This calculation has not been validated in all clinical situations. eGFR's persistently <60 mL/min signify possible Chronic Kidney Disease.    Anion gap 9 5 - 15    Comment: Performed at [facility], [address] [place], [place] [address]  Troponin I     Status: Abnormal   Collection Time: 01/12/18  4:38 PM  Result Value Ref Range   Troponin I 0.03 (HH) <0.03 ng/mL    Comment: CRITICAL RESULT CALLED TO, READ BACK BY AND VERIFIED WITH CASSIE WELCH '@[id number]'  01/12/18 AKT Performed at [facility], [address] [place], [place] [address]   Blood gas, venous     Status: Abnormal   Collection Time: 01/12/18  6:00 PM  Result Value Ref Range   pH, Ven 7.51 (H) 7.250 - 7.430   pCO2, Ven 44 44.0 - 60.0 mmHg   pO2, Ven 60.0 (H) 32.0 - 45.0 mmHg   Bicarbonate 35.1 (H) 20.0 - 28.0 mmol/L   Acid-Base Excess 11.0 (H) 0.0 - 2.0 mmol/L   O2 Saturation 93.0 %   Patient temperature 37.0    Collection site VEIN    Sample type VEIN     Comment: Performed at [facility], [address] [place], [place] [address]  H. pylori breath test     Status: None   Collection Time: 02/23/18  2:32 PM  Result Value Ref Range   H pylori Breath Test Negative Negative  Surgical pathology     Status: None   Collection Time: 03/11/18  8:16 AM  Result Value Ref Range   SURGICAL PATHOLOGY      Surgical Pathology CASE: [id number] PATIENT: Marcial Pacas Surgical Pathology Report     SPECIMEN SUBMITTED: A. Stomach, random; cbx  CLINICAL HISTORY: None provided  PRE-OPERATIVE DIAGNOSIS: Nausea and vomiting R11.2  POST-OPERATIVE DIAGNOSIS: None provided.     DIAGNOSIS: A.  STOMACH, RANDOM; COLD BIOPSIES: - ANTRAL MUCOSA SHOWING MILD FOVEOLAR GLAND HYPERPLASIA. - NO EVIDENCE OF  ACTIVE GASTRITIS.  GROSS DESCRIPTION: A. Labeled: Cbx random gastric Received: In formalin Tissue fragment(s): 2 Size: 0.3 and 0.5 cm Description: Tan to brown fragments Entirely submitted in one cassette.    Final Diagnosis performed by Galvin Proffer, MD.   Electronically signed 03/12/2018 2:03:52PM The electronic signature indicates that the named Attending Pathologist has evaluated the specimen  Technical component performed at [facility], [address], [place], [place] [address] Lab: [phone] Dir: Rush Farmer, MD, MMM  Professional component performed at [place], [facility], [place], [place],  [address] Lab: [phone] Dir: Dellia Nims. Rubinas, MD    Objective  Body mass index is 25.37 kg/m. Wt Readings from Last 3 Encounters:  03/23/18 164 lb 6.4 oz (74.6 kg)  03/11/18 166 lb (75.3 kg)  03/04/18 167 lb 1.9 oz (75.8 kg)   Temp Readings from Last 3 Encounters:  03/23/18 98.2 F (36.8 C) (Oral)  03/11/18 (!) 96.9 F (36.1 C) (Tympanic)  03/04/18 98.6 F (37 C) (Oral)   BP Readings from Last 3 Encounters:  03/23/18 (!) 140/58  03/11/18 (!) 163/69  03/04/18 128/64   Pulse Readings from Last 3 Encounters:  03/23/18 70  03/11/18 60  03/04/18 70    Physical Exam  Constitutional: She is oriented to person, place, and time. Vital signs are normal. She appears well-developed and well-nourished. She is cooperative.  HENT:  Head: Normocephalic and atraumatic.  Mouth/Throat: Oropharynx is clear and moist and mucous membranes are normal.  Eyes: Pupils are equal, round, and reactive to  light. Conjunctivae are normal.  Cardiovascular: Normal rate and regular rhythm.  Murmur heard. Murmur likely from fistula  Pulmonary/Chest: Effort normal and breath sounds normal.  Neurological: She is alert and oriented to person, place, and time. Gait normal.  Skin: Skin is warm, dry and intact.     Psychiatric: She has a normal mood and affect. Her speech is normal and behavior is normal. Judgment and thought content normal. Cognition and memory are normal.  Nursing note and vitals reviewed.   Assessment   1. HTN controlled today  2. ESRD on HD  3. Chronic nausea EGD 03/11/18 neg gastritis +hypertrophy of glands  4. HM Plan   1. Cont meds  2. F/u renal  3. Prn zofran  Disc nausea with GI and renal erc  4.  Flu shot utd  pna 23 hadtoday  Tetanus UTD  Hep B immune  Consider shingrix in future   Pap neg 11/13/16 neg HPVs/p hysterectomy mammo neg 03/02/18  h/o multiple polypscolonoscopy 08/20/17 repeat in 3 years [facility] -EGD 03/11/18 neg gastritis  Former smoker x 25-30 years <1 ppd CTA chest 01/12/18 moderate b/l pleural effusions and CAD and aortic atherosclerosis, no nodules  Does not make urine  Last A1C 4.87/8/19 with HD prior to that10/2018 5.6 per HD RN [place] 06/16/17 right upper thyroid nodule ? Parathyroid mass 1.1 cm and left mid nodule f/u annually   Filled out disability paperwork due to chronic back pain and ESRD and CAD  Provider: Dr. Olivia Mackie McLean-Scocuzza-Internal Medicine

## 2018-03-23 NOTE — Patient Instructions (Addendum)
Hemodialysis Dialysis is a procedure that replaces some of the work that healthy kidneys do. During dialysis, wastes, salt, and extra water are removed from the blood, and the level of certain important minerals in the blood (such as potassium) is maintained. It is typically started when you have lost about 85-90% of your kidney function, or earlier if it may help relieve your symptoms. Hemodialysis is a type of dialysis in which a machine called a dialyzer is used to filter the blood. Hemodialysis is usually performed by a health care provider at a hospital or dialysis center 3 times a week for 3-5 hours during each visit. It may also be performed on a different schedule at home with training. Tell a health care provider about:  Any allergies you have.  All medicines you are taking, including vitamins, herbs, eye drops, creams, and over-the-counter medicines.  Any blood disorders you have.  Any medical conditions you have. What are the risks? Generally, this is a safe procedure. However, problems may occur, including:  Low blood pressure (hypotension).  Narrowing or ballooning of a blood vessel, or bleeding at the site where blood is removed from the body and returned to the body (vascular access).  An infection or blockage at the vascular access site.  Allergic reaction to chemicals used to sterilize the dialyzer.  What happens before the procedure? Before having hemodialysis for the first time, you will need surgery or a procedure to create a vascular access site. There are three types of vascular access:  Arteriovenous fistula. This type of access is created when an artery and a vein (usually in the arm) are connected by a surgical procedure. The arteriovenous fistula usually takes 1-6 months to develop after surgery.  Arteriovenous graft. This type of access is created when a tube is used to connect an artery and a vein in the arm. An arteriovenous graft can usually be used within  2-3 weeks of surgery.  A venous catheter. To create this type of access, a thin, flexible tube (catheter) is placed in a large vein in your neck, chest, or groin. A venous catheter can be used right away.  What happens during the procedure?  Your weight, blood pressure, pulse, and temperature will be measured.  The skin around your vascular access will be cleaned (sterilized) to get rid of germs.  Your vascular access will be connected to the dialyzer. ? If your access is a fistula or graft, two needles will be inserted through the vascular access. The needles will be connected to a plastic tube that is connected to the dialyzer. They will be taped to your skin so that they do not move out of place. ? If your access is a catheter, it will be connected to a plastic tube that is then connected to the dialyzer.  The dialysis machine will be turned on. This will cause your blood to flow to the dialyzer. The dialyzer will filter and clean your blood before returning it to your body. While the dialysis machine is working, your blood pressure and pulse will be checked several times.  Once dialysis is complete, your vascular access will be disconnected from the dialyzer. If your access is a fistula or graft, the needles will be removed and a bandage (dressing) will be placed over the access. Hemodialysis is performed while you are sitting or reclining. During the procedure, you may sleep, read, watch television, or perform any other tasks that can be done while you are in this position.  If you experience side effects or any other discomfort during the procedure, let your health care provider know. He or she may be able to make adjustments to help you feel more comfortable. The procedure may vary among health care providers and hospitals. What happens after the procedure?  Your weight will be measured.  Your blood may be tested to see whether your treatments are removing enough wastes. This is usually  done once a month.  You may experience or continue to experience side effects, including: ? Dizziness. ? Muscle cramps. ? Nausea. ? Headaches. ? Allergic reaction to the chemicals used to sterilize the dialyzer. Summary  Dialysis is a procedure that replaces some of the work that healthy kidneys do. During dialysis, wastes, salt, and extra water are removed from the blood, and the level of certain important minerals in the blood is maintained.  Hemodialysis is a type of dialysis in which a machine called a dialyzer is used to filter the blood. It is usually performed by a health care provider at a hospital or dialysis center 3 times a week for 3-5 hours during each visit.  Before having hemodialysis for the first time, you will need surgery or a procedure to create a vascular access.  During hemodialysis, your vascular access will be connected to the dialyzer. The dialyzer will filter and clean your blood before returning it to your body. This information is not intended to replace advice given to you by your health care provider. Make sure you discuss any questions you have with your health care provider. Document Released: 09/20/2012 Document Revised: 07/01/2016 Document Reviewed: 07/01/2016 Elsevier Interactive Patient Education  2018 [employer].  Nausea, Adult Nausea is the feeling of an upset stomach or having to vomit. Nausea on its own is not usually a serious concern, but it may be an early sign of a more serious medical problem. As nausea gets worse, it can lead to vomiting. If vomiting develops, or if you are not able to drink enough fluids, you are at risk of becoming dehydrated. Dehydration can make you tired and thirsty, cause you to have a dry mouth, and decrease how often you urinate. Older adults and people with other diseases or a weak immune system are at higher risk for dehydration. The main goals of treating your nausea are:  To limit repeated nausea episodes.  To  prevent vomiting and dehydration.  Follow these instructions at home: Follow instructions from your health care provider about how to care for yourself at home. Eating and drinking Follow these recommendations as told by your health care provider:  Take an oral rehydration solution (ORS). This is a drink that is sold at pharmacies and retail stores.  Drink clear fluids in small amounts as you are able. Clear fluids include water, ice chips, diluted fruit juice, and low-calorie sports drinks.  Eat bland, easy-to-digest foods in small amounts as you are able. These foods include bananas, applesauce, rice, lean meats, toast, and crackers.  Avoid drinking fluids that contain a lot of sugar or caffeine, such as energy drinks, sports drinks, and soda.  Avoid alcohol.  Avoid spicy or fatty foods.  General instructions  Drink enough fluid to keep your urine clear or pale yellow.  Wash your hands often. If soap and water are not available, use hand sanitizer.  Make sure that all people in your household wash their hands well and often.  Rest at home while you recover.  Take over-the-counter and prescription medicines only as  told by your health care provider.  Breathe slowly and deeply when you feel nauseous.  Watch your condition for any changes.  Keep all follow-up visits as told by your health care provider. This is important. Contact a health care provider if:  You have a headache.  You have new symptoms.  Your nausea gets worse.  You have a fever.  You feel light-headed or dizzy.  You vomit.  You cannot keep fluids down. Get help right away if:  You have pain in your chest, neck, arm, or jaw.  You feel extremely weak or you faint.  You have vomit that is bright red or looks like coffee grounds.  You have bloody or black stools or stools that look like tar.  You have a severe headache, a stiff neck, or both.  You have severe pain, cramping, or bloating in  your abdomen.  You have a rash.  You have difficulty breathing or are breathing very quickly.  Your heart is beating very quickly.  Your skin feels cold and clammy.  You feel confused.  You have pain when you urinate.  You have signs of dehydration, such as: ? Dark urine, very little, or no urine. ? Cracked lips. ? Dry mouth. ? Sunken eyes. ? Sleepiness. ? Weakness. These symptoms may represent a serious problem that is an emergency. Do not wait to see if the symptoms will go away. Get medical help right away. Call your local emergency services (911 in the [place] Do not drive yourself to the hospital. This information is not intended to replace advice given to you by your health care provider. Make sure you discuss any questions you have with your health care provider. Document Released: 07/03/2004 Document Revised: 10/29/2015 Document Reviewed: 01/30/2015 Elsevier Interactive Patient Education  [employer].

## 2018-03-23 NOTE — Progress Notes (Signed)
Pre visit review using our clinic review tool, if applicable. No additional management support is needed unless otherwise documented below in the visit note. 

## 2018-03-24 DIAGNOSIS — D509 Iron deficiency anemia, unspecified: Secondary | ICD-10-CM | POA: Diagnosis not present

## 2018-03-24 DIAGNOSIS — D631 Anemia in chronic kidney disease: Secondary | ICD-10-CM | POA: Diagnosis not present

## 2018-03-24 DIAGNOSIS — Z992 Dependence on renal dialysis: Secondary | ICD-10-CM | POA: Diagnosis not present

## 2018-03-24 DIAGNOSIS — N186 End stage renal disease: Secondary | ICD-10-CM | POA: Diagnosis not present

## 2018-03-24 DIAGNOSIS — N2581 Secondary hyperparathyroidism of renal origin: Secondary | ICD-10-CM | POA: Diagnosis not present

## 2018-03-25 DIAGNOSIS — N186 End stage renal disease: Secondary | ICD-10-CM

## 2018-03-25 DIAGNOSIS — Z01818 Encounter for other preprocedural examination: Secondary | ICD-10-CM

## 2018-03-25 DIAGNOSIS — Z7682 Awaiting organ transplant status: Principal | ICD-10-CM

## 2018-03-26 DIAGNOSIS — D631 Anemia in chronic kidney disease: Secondary | ICD-10-CM | POA: Diagnosis not present

## 2018-03-26 DIAGNOSIS — Z992 Dependence on renal dialysis: Secondary | ICD-10-CM | POA: Diagnosis not present

## 2018-03-26 DIAGNOSIS — N186 End stage renal disease: Secondary | ICD-10-CM | POA: Diagnosis not present

## 2018-03-26 DIAGNOSIS — D509 Iron deficiency anemia, unspecified: Secondary | ICD-10-CM | POA: Diagnosis not present

## 2018-03-26 DIAGNOSIS — N2581 Secondary hyperparathyroidism of renal origin: Secondary | ICD-10-CM | POA: Diagnosis not present

## 2018-03-29 DIAGNOSIS — N2581 Secondary hyperparathyroidism of renal origin: Secondary | ICD-10-CM | POA: Diagnosis not present

## 2018-03-29 DIAGNOSIS — N186 End stage renal disease: Secondary | ICD-10-CM | POA: Diagnosis not present

## 2018-03-29 DIAGNOSIS — Z992 Dependence on renal dialysis: Secondary | ICD-10-CM | POA: Diagnosis not present

## 2018-03-29 DIAGNOSIS — D509 Iron deficiency anemia, unspecified: Secondary | ICD-10-CM | POA: Diagnosis not present

## 2018-03-29 DIAGNOSIS — D631 Anemia in chronic kidney disease: Secondary | ICD-10-CM | POA: Diagnosis not present

## 2018-03-30 ENCOUNTER — Institutional Professional Consult (permissible substitution): Payer: Medicare Other | Admitting: Internal Medicine

## 2018-03-31 DIAGNOSIS — N2581 Secondary hyperparathyroidism of renal origin: Secondary | ICD-10-CM | POA: Diagnosis not present

## 2018-03-31 DIAGNOSIS — D631 Anemia in chronic kidney disease: Secondary | ICD-10-CM | POA: Diagnosis not present

## 2018-03-31 DIAGNOSIS — Z992 Dependence on renal dialysis: Secondary | ICD-10-CM | POA: Diagnosis not present

## 2018-03-31 DIAGNOSIS — D509 Iron deficiency anemia, unspecified: Secondary | ICD-10-CM | POA: Diagnosis not present

## 2018-03-31 DIAGNOSIS — N186 End stage renal disease: Secondary | ICD-10-CM | POA: Diagnosis not present

## 2018-04-01 ENCOUNTER — Encounter: Payer: Self-pay | Admitting: Internal Medicine

## 2018-04-01 ENCOUNTER — Ambulatory Visit (INDEPENDENT_AMBULATORY_CARE_PROVIDER_SITE_OTHER): Payer: Medicare Other | Admitting: Internal Medicine

## 2018-04-01 VITALS — BP 130/62 | HR 67 | Resp 16 | Ht 67.5 in | Wt 163.0 lb

## 2018-04-01 DIAGNOSIS — G4719 Other hypersomnia: Secondary | ICD-10-CM | POA: Diagnosis not present

## 2018-04-01 DIAGNOSIS — I6523 Occlusion and stenosis of bilateral carotid arteries: Secondary | ICD-10-CM

## 2018-04-01 DIAGNOSIS — I5022 Chronic systolic (congestive) heart failure: Secondary | ICD-10-CM

## 2018-04-01 DIAGNOSIS — R0602 Shortness of breath: Secondary | ICD-10-CM

## 2018-04-01 NOTE — Patient Instructions (Signed)
Patient needs sleep study ASAP Recommend PFT's

## 2018-04-01 NOTE — Progress Notes (Signed)
Name: Theresa [name] MRN: [id number] DOB: 06-07-1956     CONSULTATION DATE: 10.24.19 REFERRING MD : Aundra Dubin  CHIEF COMPLAINT: SOB  STUDIES:      CT chest Independently reviewed by Me from 01/2018 B/l interstitial infiltrates B/l effusions  HISTORY OF PRESENT ILLNESS: 62 yo pleasant AAF with multiple medical issues Patient has h/o CHF sees CHF clinic and [name] Patient has ESRD on HD sees Dr Holley Raring  Patient has intermittent SOB and increased WOB she states related to fluid Her last CT chest in 01/2018 suggests pulmonary edema and effusions  She has no SOB at this time No cough No fevers No wheezing  No signs of infection at this time No signs of CHF at this time  Patient's states that she has had a diagnosis of sleep apnea in the past Patient  has been having sleep problems many years Patient has been having excessive daytime sleepiness Patient has been having extreme fatigue and tiredness, lack of energy +  very Loud snoring every night + struggling breathe at night and gasps for air  She has refused to wear the CPAP due to the feeling of suffocation I have explained to her that we have multiple masks that we can try and assess  Patient has been admitted several times and was told she has COPD She does have an extensive smoking history 50-pack-year quit 2014 She is currently on disability     EPWORTH SLEEP SCORE 4 PAST MEDICAL HISTORY :   has a past medical history of Anemia of chronic disease, Anginal pain ([place]), Carotid arterial disease ([place]), Chronic constipation, Chronic diastolic CHF (congestive heart failure) ([place]), Colon polyps, COPD (chronic obstructive pulmonary disease) ([place]), Coronary artery disease, Diabetes mellitus, Diabetic neuropathy ([place]), Diabetic retinopathy ([place]) (05/28/2013), Emphysema, ESRD on hemodialysis ([place]), History of bronchitis, History of pneumonia, History of tobacco abuse, Hyperlipidemia, Hypertension, Moderate to severe mitral  insufficiency, Myocardial infarct ([place]) (05/2013), Peripheral vascular disease ([place]), Sickle cell trait ([place]), and Thyroid nodule.  has a past surgical history that includes Abdominal hysterectomy; Dilation and curettage of uterus; Esophagogastroduodenoscopy; colonscopy; Tubal ligation; Pars plana vitrectomy (04/22/2011); Pars plana vitrectomy (09/30/2011); Gas/fluid exchange (09/30/2011); Gas insertion (09/30/2011); Pars plana vitrectomy (02/24/2012); Silicon oil removal (8/41/3244); Mitral valve replacement; Thrombectomy / arteriovenous graft revision; Cardiac catheterization; Coronary angioplasty (05/28/2014); left heart catheterization with coronary angiogram (N/A, 05/28/2013); Cardiac catheterization (N/A, 05/02/2015); Colonoscopy with propofol (N/A, 07/08/2016); Eye surgery; Eye surgery; Eye surgery; Colonoscopy with propofol (N/A, 08/20/2017); A/V Fistulagram (Left, 11/10/2017); and Esophagogastroduodenoscopy (egd) with propofol (N/A, 03/11/2018). Prior to Admission medications   Medication Sig Start Date End Date Taking? Authorizing Provider  albuterol (PROVENTIL) (2.5 MG/3ML) 0.083% nebulizer solution Take 2.5 mg by nebulization every 6 (six) hours as needed for wheezing or shortness of breath.    [provider]  albuterol (VENTOLIN HFA) 108 (90 Base) MCG/ACT inhaler Inhale into the lungs every 6 (six) hours as needed for wheezing or shortness of breath.    [provider]  amLODipine (NORVASC) 10 MG tablet Take 10 mg by mouth daily.  09/17/16   [provider]  aspirin EC 81 MG tablet Take 81 mg by mouth at bedtime.     [provider]  atorvastatin (LIPITOR) 40 MG tablet Take 1 tablet (40 mg total) by mouth daily. At night 01/05/18   McLean-Scocuzza, Nino Glow, MD  AURYXIA 1 GM 210 MG(Fe) tablet Take 420-630 mg by mouth 3 (three) times daily with meals. Take 630 mg with each meal and  420 mg with each snack 09/17/17   [provider]  carvedilol (COREG) 12.5 MG  tablet Take 1.5 tablets (18.75 mg) by mouth twice daily 02/16/18   Theora Gianotti, NP  cinacalcet (SENSIPAR) 60 MG tablet Take 60 mg by mouth daily.    [provider]  cloNIDine (CATAPRES) 0.1 MG tablet Take 0.1 mg by mouth 2 (two) times daily.    [provider]  furosemide (LASIX) 80 MG tablet Take 80 mg by mouth See admin instructions. Take 80 mg by mouth once daily on Sun, Tue, Thur, and Sat (non dialysis days)    [provider]  isosorbide mononitrate (IMDUR) 60 MG 24 hr tablet Take 1 tablet (60 mg total) by mouth daily. 07/28/17   Max Sane, MD  losartan (COZAAR) 50 MG tablet Take by mouth. 12/12/17   [provider]  meloxicam (MOBIC) 7.5 MG tablet meloxicam 7.5 mg tablet    [provider]  multivitamin (RENA-VIT) TABS tablet Take 1 tablet by mouth daily.    [provider]  nitroGLYCERIN (NITROSTAT) 0.4 MG SL tablet Place 1 tablet (0.4 mg total) under the tongue every 5 (five) minutes as needed for chest pain. 01/15/17   Burnard Hawthorne, FNP  ondansetron (ZOFRAN) 4 MG tablet Take 1 tablet (4 mg total) by mouth every 8 (eight) hours as needed for nausea or vomiting. 01/05/18   McLean-Scocuzza, Nino Glow, MD  oxyCODONE-acetaminophen (PERCOCET/ROXICET) 5-325 MG tablet Take 1 tablet by mouth 2 (two) times daily as needed for pain.  10/04/17   [provider]  pregabalin (LYRICA) 75 MG capsule Take 1 capsule (75 mg total) by mouth daily. 04/21/16   Burnard Hawthorne, FNP  ranitidine (ZANTAC) 150 MG tablet Take 1 tablet (150 mg total) by mouth 2 (two) times daily. 02/23/18 04/25/18  [name], MD  umeclidinium-vilanterol [facility] ELLIPTA) 62.5-25 MCG/INH AEPB Inhale 1 puff into the lungs daily. 12/29/17   McLean-Scocuzza, Nino Glow, MD   Allergies  Allergen Reactions  . Hydrocodone Hives  . Metformin Diarrhea and Other (See Comments)    Other reaction(s): Distress (finding)  . Lisinopril     Unknown reaction     FAMILY  HISTORY:  family history includes Breast cancer in her sister; Colon cancer in her father and sister; Diabetes in her brother, brother, and daughter; Heart attack in her brother and mother; Heart disease in her mother; Stroke in her brother and mother. SOCIAL HISTORY:  reports that she quit smoking about 7 years ago. She has a 25.00 pack-year smoking history. She has never used smokeless tobacco. She reports that she does not drink alcohol or use drugs.  REVIEW OF SYSTEMS:   Constitutional: Negative for fever, chills, weight loss, +malaise/fatigue and diaphoresis.  HENT: Negative for hearing loss, ear pain, nosebleeds, congestion, sore throat, neck pain, tinnitus and ear discharge.   Eyes: Negative for blurred vision, double vision, photophobia, pain, discharge and redness.  Respiratory: Negative for cough, hemoptysis, sputum production, shortness of breath, wheezing and stridor.   Cardiovascular: Negative for chest pain, palpitations, orthopnea, claudication, leg swelling and PND.  Gastrointestinal: Negative for heartburn, nausea, vomiting, abdominal pain, diarrhea, constipation, blood in stool and melena.  Genitourinary: Negative for dysuria, urgency, frequency, hematuria and flank pain.  Musculoskeletal: Negative for myalgias, back pain, joint pain and falls.  Skin: Negative for itching and rash.  Neurological: Negative for dizziness, tingling, tremors, sensory change, speech change, focal weakness, seizures, loss of consciousness, weakness and headaches.  Endo/Heme/Allergies: Negative for  environmental allergies and polydipsia. Does not bruise/bleed easily.  ALL OTHER ROS ARE NEGATIVE  BP 130/62 (BP Location: Left Arm, Cuff Size: Normal)   Pulse 67   Resp 16   Ht 5' 7.5" (1.715 m)   Wt 163 lb (73.9 kg)   SpO2 100%   BMI 25.15 kg/m    Physical Examination:   GENERAL:NAD, no fevers, chills, no weakness + fatigue HEAD: Normocephalic, atraumatic.  EYES: Pupils equal, round, reactive  to light. Extraocular muscles intact. No scleral icterus.  MOUTH: Moist mucosal membrane.   EAR, NOSE, THROAT: Clear without exudates. No external lesions.  NECK: Supple. No thyromegaly. No nodules. No JVD.  PULMONARY:CTA B/L no wheezes, no crackles, no rhonchi CARDIOVASCULAR: S1 and S2. Regular rate and rhythm. No murmurs, rubs, or gallops. No edema.  GASTROINTESTINAL: Soft, nontender, nondistended. No masses. Positive bowel sounds.  MUSCULOSKELETAL: No swelling, clubbing, or edema. Range of motion full in all extremities.  NEUROLOGIC: Cranial nerves II through XII are intact. No gross focal neurological deficits.  SKIN: No ulceration, lesions, rashes, or cyanosis. Skin warm and dry. Turgor intact.  PSYCHIATRIC: Mood, affect within normal limits. The patient is awake, alert and oriented x 3. Insight, judgment intact.      ASSESSMENT / PLAN: 62 year old pleasant African-American female seen today for multiple medical issues in the setting of chronic systolic heart failure with end-stage renal disease on dialysis with a history of 50-pack-year smoking abuse with probable underlying COPD in the setting of sleep apnea that is untreated  At this time I would highly recommend reassessing her sleep apnea with a sleep study test as soon as possible I would also suggest obtaining pulmonary function testing to assess for obstructive lung disease  I have explained to the patient that once we confirm sleep apnea we can try different masks to assist her  At this time I do not recommend starting inhaler therapy At this time no indication for steroids  I recommend continuing following with cardiology Continue hemodialysis as prescribed  Once sleep study and pulmonary function tests are completed we will do a follow-up and interval assessment    Patient satisfied with Plan of action and management. All questions answered  Corrin Parker, M.D.  [facility]    Medical Director [place] Director [facility] Cardio-Pulmonary Department

## 2018-04-02 DIAGNOSIS — D509 Iron deficiency anemia, unspecified: Secondary | ICD-10-CM | POA: Diagnosis not present

## 2018-04-02 DIAGNOSIS — N2581 Secondary hyperparathyroidism of renal origin: Secondary | ICD-10-CM | POA: Diagnosis not present

## 2018-04-02 DIAGNOSIS — Z992 Dependence on renal dialysis: Secondary | ICD-10-CM | POA: Diagnosis not present

## 2018-04-02 DIAGNOSIS — N186 End stage renal disease: Secondary | ICD-10-CM | POA: Diagnosis not present

## 2018-04-02 DIAGNOSIS — D631 Anemia in chronic kidney disease: Secondary | ICD-10-CM | POA: Diagnosis not present

## 2018-04-05 DIAGNOSIS — Z992 Dependence on renal dialysis: Secondary | ICD-10-CM | POA: Diagnosis not present

## 2018-04-05 DIAGNOSIS — N2581 Secondary hyperparathyroidism of renal origin: Secondary | ICD-10-CM | POA: Diagnosis not present

## 2018-04-05 DIAGNOSIS — D509 Iron deficiency anemia, unspecified: Secondary | ICD-10-CM | POA: Diagnosis not present

## 2018-04-05 DIAGNOSIS — D631 Anemia in chronic kidney disease: Secondary | ICD-10-CM | POA: Diagnosis not present

## 2018-04-05 DIAGNOSIS — N186 End stage renal disease: Secondary | ICD-10-CM | POA: Diagnosis not present

## 2018-04-06 DIAGNOSIS — M5416 Radiculopathy, lumbar region: Secondary | ICD-10-CM | POA: Diagnosis not present

## 2018-04-06 DIAGNOSIS — M5136 Other intervertebral disc degeneration, lumbar region: Secondary | ICD-10-CM | POA: Diagnosis not present

## 2018-04-07 DIAGNOSIS — N186 End stage renal disease: Secondary | ICD-10-CM | POA: Diagnosis not present

## 2018-04-07 DIAGNOSIS — Z992 Dependence on renal dialysis: Secondary | ICD-10-CM | POA: Diagnosis not present

## 2018-04-07 DIAGNOSIS — D509 Iron deficiency anemia, unspecified: Secondary | ICD-10-CM | POA: Diagnosis not present

## 2018-04-07 DIAGNOSIS — D631 Anemia in chronic kidney disease: Secondary | ICD-10-CM | POA: Diagnosis not present

## 2018-04-07 DIAGNOSIS — N2581 Secondary hyperparathyroidism of renal origin: Secondary | ICD-10-CM | POA: Diagnosis not present

## 2018-04-08 DIAGNOSIS — Z992 Dependence on renal dialysis: Secondary | ICD-10-CM | POA: Diagnosis not present

## 2018-04-08 DIAGNOSIS — N186 End stage renal disease: Secondary | ICD-10-CM | POA: Diagnosis not present

## 2018-04-09 DIAGNOSIS — D631 Anemia in chronic kidney disease: Secondary | ICD-10-CM | POA: Diagnosis not present

## 2018-04-09 DIAGNOSIS — D509 Iron deficiency anemia, unspecified: Secondary | ICD-10-CM | POA: Diagnosis not present

## 2018-04-09 DIAGNOSIS — N186 End stage renal disease: Secondary | ICD-10-CM | POA: Diagnosis not present

## 2018-04-09 DIAGNOSIS — Z992 Dependence on renal dialysis: Secondary | ICD-10-CM | POA: Diagnosis not present

## 2018-04-09 DIAGNOSIS — N2581 Secondary hyperparathyroidism of renal origin: Secondary | ICD-10-CM | POA: Diagnosis not present

## 2018-04-12 DIAGNOSIS — N186 End stage renal disease: Secondary | ICD-10-CM | POA: Diagnosis not present

## 2018-04-12 DIAGNOSIS — Z992 Dependence on renal dialysis: Secondary | ICD-10-CM | POA: Diagnosis not present

## 2018-04-12 DIAGNOSIS — D631 Anemia in chronic kidney disease: Secondary | ICD-10-CM | POA: Diagnosis not present

## 2018-04-12 DIAGNOSIS — N2581 Secondary hyperparathyroidism of renal origin: Secondary | ICD-10-CM | POA: Diagnosis not present

## 2018-04-12 DIAGNOSIS — D509 Iron deficiency anemia, unspecified: Secondary | ICD-10-CM | POA: Diagnosis not present

## 2018-04-14 ENCOUNTER — Ambulatory Visit: Payer: Medicare Other | Attending: Internal Medicine

## 2018-04-14 DIAGNOSIS — Z992 Dependence on renal dialysis: Secondary | ICD-10-CM | POA: Diagnosis not present

## 2018-04-14 DIAGNOSIS — G4710 Hypersomnia, unspecified: Secondary | ICD-10-CM | POA: Diagnosis not present

## 2018-04-14 DIAGNOSIS — G4733 Obstructive sleep apnea (adult) (pediatric): Secondary | ICD-10-CM | POA: Diagnosis not present

## 2018-04-14 DIAGNOSIS — D509 Iron deficiency anemia, unspecified: Secondary | ICD-10-CM | POA: Diagnosis not present

## 2018-04-14 DIAGNOSIS — I509 Heart failure, unspecified: Secondary | ICD-10-CM | POA: Insufficient documentation

## 2018-04-14 DIAGNOSIS — N186 End stage renal disease: Secondary | ICD-10-CM | POA: Diagnosis not present

## 2018-04-14 DIAGNOSIS — N2581 Secondary hyperparathyroidism of renal origin: Secondary | ICD-10-CM | POA: Diagnosis not present

## 2018-04-14 DIAGNOSIS — D631 Anemia in chronic kidney disease: Secondary | ICD-10-CM | POA: Diagnosis not present

## 2018-04-15 DIAGNOSIS — G4733 Obstructive sleep apnea (adult) (pediatric): Secondary | ICD-10-CM | POA: Diagnosis not present

## 2018-04-16 DIAGNOSIS — Z992 Dependence on renal dialysis: Secondary | ICD-10-CM | POA: Diagnosis not present

## 2018-04-16 DIAGNOSIS — D509 Iron deficiency anemia, unspecified: Secondary | ICD-10-CM | POA: Diagnosis not present

## 2018-04-16 DIAGNOSIS — G4733 Obstructive sleep apnea (adult) (pediatric): Secondary | ICD-10-CM | POA: Diagnosis not present

## 2018-04-16 DIAGNOSIS — N186 End stage renal disease: Secondary | ICD-10-CM | POA: Diagnosis not present

## 2018-04-16 DIAGNOSIS — D631 Anemia in chronic kidney disease: Secondary | ICD-10-CM | POA: Diagnosis not present

## 2018-04-16 DIAGNOSIS — N2581 Secondary hyperparathyroidism of renal origin: Secondary | ICD-10-CM | POA: Diagnosis not present

## 2018-04-19 DIAGNOSIS — D631 Anemia in chronic kidney disease: Secondary | ICD-10-CM | POA: Diagnosis not present

## 2018-04-19 DIAGNOSIS — Z992 Dependence on renal dialysis: Secondary | ICD-10-CM | POA: Diagnosis not present

## 2018-04-19 DIAGNOSIS — N186 End stage renal disease: Secondary | ICD-10-CM | POA: Diagnosis not present

## 2018-04-19 DIAGNOSIS — N2581 Secondary hyperparathyroidism of renal origin: Secondary | ICD-10-CM | POA: Diagnosis not present

## 2018-04-19 DIAGNOSIS — D509 Iron deficiency anemia, unspecified: Secondary | ICD-10-CM | POA: Diagnosis not present

## 2018-04-20 DIAGNOSIS — D631 Anemia in chronic kidney disease: Secondary | ICD-10-CM | POA: Diagnosis not present

## 2018-04-20 DIAGNOSIS — N186 End stage renal disease: Secondary | ICD-10-CM | POA: Diagnosis not present

## 2018-04-20 DIAGNOSIS — Z992 Dependence on renal dialysis: Secondary | ICD-10-CM | POA: Diagnosis not present

## 2018-04-20 DIAGNOSIS — D509 Iron deficiency anemia, unspecified: Secondary | ICD-10-CM | POA: Diagnosis not present

## 2018-04-20 DIAGNOSIS — N2581 Secondary hyperparathyroidism of renal origin: Secondary | ICD-10-CM | POA: Diagnosis not present

## 2018-04-21 ENCOUNTER — Telehealth: Payer: Self-pay | Admitting: Internal Medicine

## 2018-04-21 DIAGNOSIS — D631 Anemia in chronic kidney disease: Secondary | ICD-10-CM | POA: Diagnosis not present

## 2018-04-21 DIAGNOSIS — D509 Iron deficiency anemia, unspecified: Secondary | ICD-10-CM | POA: Diagnosis not present

## 2018-04-21 DIAGNOSIS — Z992 Dependence on renal dialysis: Secondary | ICD-10-CM | POA: Diagnosis not present

## 2018-04-21 DIAGNOSIS — N2581 Secondary hyperparathyroidism of renal origin: Secondary | ICD-10-CM | POA: Diagnosis not present

## 2018-04-21 DIAGNOSIS — N186 End stage renal disease: Secondary | ICD-10-CM | POA: Diagnosis not present

## 2018-04-21 NOTE — Telephone Encounter (Signed)
I filled out this paperwork and gave to pt at last visit - what does she need?   [place]

## 2018-04-21 NOTE — Telephone Encounter (Signed)
Please advise 

## 2018-04-21 NOTE — Telephone Encounter (Signed)
Copied from [place] [id number]. Topic: Quick Communication - See Telephone Encounter >> Apr 21, 2018  3:30 PM Rutherford Nail, [place] wrote: CRM for notification. See Telephone encounter for: 04/21/18. Patient calling and would like a call back from Dr Aundra Dubin regarding long term disability paperwork and what they are needing. Please advise.  CB#: [phone]

## 2018-04-22 ENCOUNTER — Telehealth: Payer: Self-pay | Admitting: *Deleted

## 2018-04-22 DIAGNOSIS — G4733 Obstructive sleep apnea (adult) (pediatric): Secondary | ICD-10-CM

## 2018-04-22 NOTE — Telephone Encounter (Signed)
Patient would like a letter sent to the disability office,letter has to  state when she was in dr office and reason why and a copy of the note and the ability to go to work. Letter faxed to [phone].  Paperwork was not received through the mail when patient sent it in.

## 2018-04-22 NOTE — Telephone Encounter (Signed)
Patient aware of results. Orders placed nothing further needed.

## 2018-04-23 DIAGNOSIS — N2581 Secondary hyperparathyroidism of renal origin: Secondary | ICD-10-CM | POA: Diagnosis not present

## 2018-04-23 DIAGNOSIS — N186 End stage renal disease: Secondary | ICD-10-CM | POA: Diagnosis not present

## 2018-04-23 DIAGNOSIS — Z992 Dependence on renal dialysis: Secondary | ICD-10-CM | POA: Diagnosis not present

## 2018-04-23 DIAGNOSIS — D509 Iron deficiency anemia, unspecified: Secondary | ICD-10-CM | POA: Diagnosis not present

## 2018-04-23 DIAGNOSIS — D631 Anemia in chronic kidney disease: Secondary | ICD-10-CM | POA: Diagnosis not present

## 2018-04-26 DIAGNOSIS — D509 Iron deficiency anemia, unspecified: Secondary | ICD-10-CM | POA: Diagnosis not present

## 2018-04-26 DIAGNOSIS — N2581 Secondary hyperparathyroidism of renal origin: Secondary | ICD-10-CM | POA: Diagnosis not present

## 2018-04-26 DIAGNOSIS — N186 End stage renal disease: Secondary | ICD-10-CM | POA: Diagnosis not present

## 2018-04-26 DIAGNOSIS — Z992 Dependence on renal dialysis: Secondary | ICD-10-CM | POA: Diagnosis not present

## 2018-04-26 DIAGNOSIS — D631 Anemia in chronic kidney disease: Secondary | ICD-10-CM | POA: Diagnosis not present

## 2018-04-28 DIAGNOSIS — Z992 Dependence on renal dialysis: Secondary | ICD-10-CM | POA: Diagnosis not present

## 2018-04-28 DIAGNOSIS — N2581 Secondary hyperparathyroidism of renal origin: Secondary | ICD-10-CM | POA: Diagnosis not present

## 2018-04-28 DIAGNOSIS — N186 End stage renal disease: Secondary | ICD-10-CM | POA: Diagnosis not present

## 2018-04-28 DIAGNOSIS — D631 Anemia in chronic kidney disease: Secondary | ICD-10-CM | POA: Diagnosis not present

## 2018-04-28 DIAGNOSIS — D509 Iron deficiency anemia, unspecified: Secondary | ICD-10-CM | POA: Diagnosis not present

## 2018-04-29 ENCOUNTER — Ambulatory Visit: Payer: Medicare Other | Admitting: Podiatry

## 2018-04-30 ENCOUNTER — Encounter: Payer: Self-pay | Admitting: Internal Medicine

## 2018-04-30 DIAGNOSIS — N186 End stage renal disease: Secondary | ICD-10-CM | POA: Diagnosis not present

## 2018-04-30 DIAGNOSIS — D631 Anemia in chronic kidney disease: Secondary | ICD-10-CM | POA: Diagnosis not present

## 2018-04-30 DIAGNOSIS — N2581 Secondary hyperparathyroidism of renal origin: Secondary | ICD-10-CM | POA: Diagnosis not present

## 2018-04-30 DIAGNOSIS — D509 Iron deficiency anemia, unspecified: Secondary | ICD-10-CM | POA: Diagnosis not present

## 2018-04-30 DIAGNOSIS — Z992 Dependence on renal dialysis: Secondary | ICD-10-CM | POA: Diagnosis not present

## 2018-04-30 NOTE — Telephone Encounter (Signed)
Fax letter with last clinic note to [phone]   Thanks tMS

## 2018-04-30 NOTE — Telephone Encounter (Signed)
Information and letter has been faxed.

## 2018-05-02 DIAGNOSIS — Z992 Dependence on renal dialysis: Secondary | ICD-10-CM | POA: Diagnosis not present

## 2018-05-02 DIAGNOSIS — D631 Anemia in chronic kidney disease: Secondary | ICD-10-CM | POA: Diagnosis not present

## 2018-05-02 DIAGNOSIS — N186 End stage renal disease: Secondary | ICD-10-CM | POA: Diagnosis not present

## 2018-05-02 DIAGNOSIS — D509 Iron deficiency anemia, unspecified: Secondary | ICD-10-CM | POA: Diagnosis not present

## 2018-05-02 DIAGNOSIS — N2581 Secondary hyperparathyroidism of renal origin: Secondary | ICD-10-CM | POA: Diagnosis not present

## 2018-05-04 DIAGNOSIS — Z992 Dependence on renal dialysis: Secondary | ICD-10-CM | POA: Diagnosis not present

## 2018-05-04 DIAGNOSIS — N2581 Secondary hyperparathyroidism of renal origin: Secondary | ICD-10-CM | POA: Diagnosis not present

## 2018-05-04 DIAGNOSIS — D631 Anemia in chronic kidney disease: Secondary | ICD-10-CM | POA: Diagnosis not present

## 2018-05-04 DIAGNOSIS — N186 End stage renal disease: Secondary | ICD-10-CM | POA: Diagnosis not present

## 2018-05-04 DIAGNOSIS — D509 Iron deficiency anemia, unspecified: Secondary | ICD-10-CM | POA: Diagnosis not present

## 2018-05-07 DIAGNOSIS — D509 Iron deficiency anemia, unspecified: Secondary | ICD-10-CM | POA: Diagnosis not present

## 2018-05-07 DIAGNOSIS — N186 End stage renal disease: Secondary | ICD-10-CM | POA: Diagnosis not present

## 2018-05-07 DIAGNOSIS — D631 Anemia in chronic kidney disease: Secondary | ICD-10-CM | POA: Diagnosis not present

## 2018-05-07 DIAGNOSIS — Z992 Dependence on renal dialysis: Secondary | ICD-10-CM | POA: Diagnosis not present

## 2018-05-07 DIAGNOSIS — N2581 Secondary hyperparathyroidism of renal origin: Secondary | ICD-10-CM | POA: Diagnosis not present

## 2018-05-08 DIAGNOSIS — N186 End stage renal disease: Secondary | ICD-10-CM | POA: Diagnosis not present

## 2018-05-08 DIAGNOSIS — Z992 Dependence on renal dialysis: Secondary | ICD-10-CM | POA: Diagnosis not present

## 2018-05-09 DIAGNOSIS — N186 End stage renal disease: Secondary | ICD-10-CM | POA: Diagnosis not present

## 2018-05-09 DIAGNOSIS — N2581 Secondary hyperparathyroidism of renal origin: Secondary | ICD-10-CM | POA: Diagnosis not present

## 2018-05-09 DIAGNOSIS — D509 Iron deficiency anemia, unspecified: Secondary | ICD-10-CM | POA: Diagnosis not present

## 2018-05-09 DIAGNOSIS — Z992 Dependence on renal dialysis: Secondary | ICD-10-CM | POA: Diagnosis not present

## 2018-05-09 DIAGNOSIS — D631 Anemia in chronic kidney disease: Secondary | ICD-10-CM | POA: Diagnosis not present

## 2018-05-10 DIAGNOSIS — D631 Anemia in chronic kidney disease: Secondary | ICD-10-CM | POA: Diagnosis not present

## 2018-05-10 DIAGNOSIS — D509 Iron deficiency anemia, unspecified: Secondary | ICD-10-CM | POA: Diagnosis not present

## 2018-05-10 DIAGNOSIS — N186 End stage renal disease: Secondary | ICD-10-CM | POA: Diagnosis not present

## 2018-05-10 DIAGNOSIS — Z992 Dependence on renal dialysis: Secondary | ICD-10-CM | POA: Diagnosis not present

## 2018-05-10 DIAGNOSIS — N2581 Secondary hyperparathyroidism of renal origin: Secondary | ICD-10-CM | POA: Diagnosis not present

## 2018-05-11 ENCOUNTER — Ambulatory Visit (INDEPENDENT_AMBULATORY_CARE_PROVIDER_SITE_OTHER): Payer: Medicare Other

## 2018-05-11 DIAGNOSIS — I6523 Occlusion and stenosis of bilateral carotid arteries: Secondary | ICD-10-CM

## 2018-05-12 DIAGNOSIS — Z992 Dependence on renal dialysis: Secondary | ICD-10-CM | POA: Diagnosis not present

## 2018-05-12 DIAGNOSIS — N186 End stage renal disease: Secondary | ICD-10-CM | POA: Diagnosis not present

## 2018-05-12 DIAGNOSIS — D509 Iron deficiency anemia, unspecified: Secondary | ICD-10-CM | POA: Diagnosis not present

## 2018-05-12 DIAGNOSIS — N2581 Secondary hyperparathyroidism of renal origin: Secondary | ICD-10-CM | POA: Diagnosis not present

## 2018-05-12 DIAGNOSIS — D631 Anemia in chronic kidney disease: Secondary | ICD-10-CM | POA: Diagnosis not present

## 2018-05-13 ENCOUNTER — Ambulatory Visit: Payer: Medicare Other | Admitting: Podiatry

## 2018-05-14 DIAGNOSIS — N186 End stage renal disease: Secondary | ICD-10-CM | POA: Diagnosis not present

## 2018-05-14 DIAGNOSIS — D509 Iron deficiency anemia, unspecified: Secondary | ICD-10-CM | POA: Diagnosis not present

## 2018-05-14 DIAGNOSIS — Z992 Dependence on renal dialysis: Secondary | ICD-10-CM | POA: Diagnosis not present

## 2018-05-14 DIAGNOSIS — N2581 Secondary hyperparathyroidism of renal origin: Secondary | ICD-10-CM | POA: Diagnosis not present

## 2018-05-14 DIAGNOSIS — D631 Anemia in chronic kidney disease: Secondary | ICD-10-CM | POA: Diagnosis not present

## 2018-05-17 ENCOUNTER — Encounter: Admit: 2018-05-17 | Discharge: 2018-05-17 | Payer: MEDICARE | Attending: Nephrology | Primary: Nephrology

## 2018-05-17 DIAGNOSIS — D631 Anemia in chronic kidney disease: Secondary | ICD-10-CM | POA: Diagnosis not present

## 2018-05-17 DIAGNOSIS — N2581 Secondary hyperparathyroidism of renal origin: Secondary | ICD-10-CM | POA: Diagnosis not present

## 2018-05-17 DIAGNOSIS — N186 End stage renal disease: Secondary | ICD-10-CM | POA: Diagnosis not present

## 2018-05-17 DIAGNOSIS — Z992 Dependence on renal dialysis: Secondary | ICD-10-CM | POA: Diagnosis not present

## 2018-05-17 DIAGNOSIS — D509 Iron deficiency anemia, unspecified: Secondary | ICD-10-CM | POA: Diagnosis not present

## 2018-05-19 ENCOUNTER — Encounter: Payer: Self-pay | Admitting: Internal Medicine

## 2018-05-19 DIAGNOSIS — Z992 Dependence on renal dialysis: Secondary | ICD-10-CM | POA: Diagnosis not present

## 2018-05-19 DIAGNOSIS — N2581 Secondary hyperparathyroidism of renal origin: Secondary | ICD-10-CM | POA: Diagnosis not present

## 2018-05-19 DIAGNOSIS — N186 End stage renal disease: Secondary | ICD-10-CM | POA: Diagnosis not present

## 2018-05-19 DIAGNOSIS — D509 Iron deficiency anemia, unspecified: Secondary | ICD-10-CM | POA: Diagnosis not present

## 2018-05-19 DIAGNOSIS — D631 Anemia in chronic kidney disease: Secondary | ICD-10-CM | POA: Diagnosis not present

## 2018-05-20 ENCOUNTER — Encounter: Payer: Self-pay | Admitting: Podiatry

## 2018-05-20 ENCOUNTER — Ambulatory Visit (INDEPENDENT_AMBULATORY_CARE_PROVIDER_SITE_OTHER): Payer: Medicare Other | Admitting: Podiatry

## 2018-05-20 DIAGNOSIS — Z01818 Encounter for other preprocedural examination: Secondary | ICD-10-CM

## 2018-05-20 DIAGNOSIS — N186 End stage renal disease: Secondary | ICD-10-CM

## 2018-05-20 DIAGNOSIS — Z7682 Awaiting organ transplant status: Principal | ICD-10-CM

## 2018-05-20 DIAGNOSIS — D631 Anemia in chronic kidney disease: Secondary | ICD-10-CM | POA: Diagnosis not present

## 2018-05-20 DIAGNOSIS — D509 Iron deficiency anemia, unspecified: Secondary | ICD-10-CM | POA: Diagnosis not present

## 2018-05-20 DIAGNOSIS — L608 Other nail disorders: Secondary | ICD-10-CM

## 2018-05-20 DIAGNOSIS — E1140 Type 2 diabetes mellitus with diabetic neuropathy, unspecified: Secondary | ICD-10-CM

## 2018-05-20 DIAGNOSIS — B351 Tinea unguium: Secondary | ICD-10-CM

## 2018-05-20 DIAGNOSIS — M79609 Pain in unspecified limb: Secondary | ICD-10-CM

## 2018-05-20 DIAGNOSIS — Z992 Dependence on renal dialysis: Secondary | ICD-10-CM | POA: Diagnosis not present

## 2018-05-20 DIAGNOSIS — N2581 Secondary hyperparathyroidism of renal origin: Secondary | ICD-10-CM | POA: Diagnosis not present

## 2018-05-20 NOTE — Progress Notes (Signed)
Complaint:  Visit Type: Patient returns to my office for continued preventative foot care services. Complaint: Patient states" my nails have grown long and thick and become painful to walk and wear shoes" Patient has been diagnosed with DM with no foot complications. The patient presents for preventative foot care services. No changes to ROS  Podiatric Exam: Vascular: dorsalis pedis and posterior tibial pulses are palpable bilateral. Capillary return is immediate. Temperature gradient is WNL. Skin turgor WNL  Sensorium: Normal Semmes Weinstein monofilament test. Normal tactile sensation bilaterally. Nail Exam: Pt has thick disfigured discolored nails with subungual debris noted bilateral entire nail hallux through fifth toenails.  Pincer nails  B/L Ulcer Exam: There is no evidence of ulcer or pre-ulcerative changes or infection. Orthopedic Exam: Muscle tone and strength are WNL. No limitations in general ROM. No crepitus or effusions noted. Foot type and digits show no abnormalities. HAV  B/L Skin: No Porokeratosis. No infection or ulcers.   Diagnosis:  Onychomycosis, , Pain in right toe, pain in left toes  Treatment & Plan Procedures and Treatment: Consent by patient was obtained for treatment procedures. The patient understood the discussion of treatment and procedures well. All questions were answered thoroughly reviewed. Debridement of mycotic and hypertrophic toenails, 1 through 5 bilateral and clearing of subungual debris. No ulceration, no infection noted. ABN signed for 2019. Return Visit-Office Procedure: Patient instructed to return to the office for a follow up visit 3 months for continued evaluation and treatment.    Gardiner Barefoot DPM

## 2018-05-21 DIAGNOSIS — D509 Iron deficiency anemia, unspecified: Secondary | ICD-10-CM | POA: Diagnosis not present

## 2018-05-21 DIAGNOSIS — N186 End stage renal disease: Secondary | ICD-10-CM | POA: Diagnosis not present

## 2018-05-21 DIAGNOSIS — Z992 Dependence on renal dialysis: Secondary | ICD-10-CM | POA: Diagnosis not present

## 2018-05-21 DIAGNOSIS — N2581 Secondary hyperparathyroidism of renal origin: Secondary | ICD-10-CM | POA: Diagnosis not present

## 2018-05-21 DIAGNOSIS — D631 Anemia in chronic kidney disease: Secondary | ICD-10-CM | POA: Diagnosis not present

## 2018-05-24 DIAGNOSIS — N186 End stage renal disease: Secondary | ICD-10-CM | POA: Diagnosis not present

## 2018-05-24 DIAGNOSIS — N2581 Secondary hyperparathyroidism of renal origin: Secondary | ICD-10-CM | POA: Diagnosis not present

## 2018-05-24 DIAGNOSIS — Z992 Dependence on renal dialysis: Secondary | ICD-10-CM | POA: Diagnosis not present

## 2018-05-24 DIAGNOSIS — D509 Iron deficiency anemia, unspecified: Secondary | ICD-10-CM | POA: Diagnosis not present

## 2018-05-24 DIAGNOSIS — D631 Anemia in chronic kidney disease: Secondary | ICD-10-CM | POA: Diagnosis not present

## 2018-05-25 ENCOUNTER — Encounter: Payer: Self-pay | Admitting: Internal Medicine

## 2018-05-25 ENCOUNTER — Ambulatory Visit (INDEPENDENT_AMBULATORY_CARE_PROVIDER_SITE_OTHER): Payer: Medicare Other | Admitting: Internal Medicine

## 2018-05-25 VITALS — BP 112/58 | HR 47 | Temp 98.0°F | Resp 15 | Ht 67.5 in | Wt 163.4 lb

## 2018-05-25 DIAGNOSIS — I10 Essential (primary) hypertension: Secondary | ICD-10-CM

## 2018-05-25 DIAGNOSIS — R251 Tremor, unspecified: Secondary | ICD-10-CM

## 2018-05-25 DIAGNOSIS — N186 End stage renal disease: Secondary | ICD-10-CM

## 2018-05-25 DIAGNOSIS — Z992 Dependence on renal dialysis: Secondary | ICD-10-CM | POA: Diagnosis not present

## 2018-05-25 DIAGNOSIS — R413 Other amnesia: Secondary | ICD-10-CM | POA: Diagnosis not present

## 2018-05-25 NOTE — Progress Notes (Addendum)
Chief Complaint  Patient presents with  . Follow-up    Diabetes, Hypertension, Hyperlipidemia   F/u  1. HTN controlled today on norvasc 10, clonidine 0.1 bid, losartan 50, imdur 60 er qd coreg 18.75 mg bid, lasix 80 mg qd  2. Nausea improved a little reviewed meds lyrica, sensipar, narcotics can cause nausea she denies constipation  3. OSA pending to get cpap machine on 06/03/18  4. C/o memory loss and shaking spells told Dr. Holley Raring and he referred to neurology check on appt with pt  5. hyperphos given pt list of high phos foods today  6. ESRD on HD still on transplant list    Review of Systems  Constitutional: Negative for weight loss.  HENT: Negative for hearing loss.   Eyes: Negative for blurred vision.  Respiratory: Negative for shortness of breath.   Cardiovascular: Negative for chest pain.  Gastrointestinal: Negative for nausea.  Skin: Negative for rash.  Neurological: Negative for headaches.  Psychiatric/Behavioral: Positive for memory loss.       +shaking spells     Past Medical History:  Diagnosis Date  . Anemia of chronic disease   . Anginal pain ([place])   . Carotid arterial disease ([place])    a. 02/2013 U/S: 40-59% bilat ICA stenosis.  . Chronic constipation   . Chronic diastolic CHF (congestive heart failure) ([place])    a. 10/2013 Echo [facility]): EF 55-60%, mod conc LVH, mod MR, mildly dil LA, mild Ao sclerosis w/o stenosis; b. 02/2016 Echo: EF 55-60%, no rwma, Gr DD, mild AS, mod to sev MR, mildly dil LA, PASP 93mmHg; c. 07/2017 Echo [facility]): EF >55%, mild to mod LVH, Gr2 DD, Ao scl, Mod MR, mod dil LA, nl RV fxn.  . Colon polyps   . COPD (chronic obstructive pulmonary disease) ([place])   . Coronary artery disease    a. 05/2013 NSTEMI/PCI: LM 20d, LAD min irregs, LCX small, nl, OM1 nl, RCA dom 35m (2.5x16 Promus DES), PDA1 80p; b. 04/2015 Cath: LM nl, LAD 28m, D1/2 min irregs, LCX 35p/m, OM2/3 min irregs, RCA patent mid stent, RPDA 70ost, RPLB1 30, RPLB2/3 min irregs, EF 55-65%-->  Med Rx; c. 06/2017 MV [facility]): No ischemia/infarct, EF 64%.  . Diabetes mellitus   . Diabetic neuropathy ([place])   . Diabetic retinopathy ([place]) 05/28/2013   Hx bilat retinal detachment, proliferative diab retinopathy and bilat vitreous hemorrhage   . Emphysema   . ESRD on hemodialysis ([place])    a. [facility] in [place], [place] [phone]/Dr. [name], on a MWF schedule.  She started dialysis in Feb 2014.  Etiology of renal failure not known, likely diabetes.  Has a left upper arm AV graft.  . History of bronchitis    Mar 2012  . History of pneumonia    June 2012  . History of tobacco abuse    a. Quit 2012.  Marland Kitchen Hyperlipidemia   . Hypertension   . Moderate to severe mitral insufficiency    a. 10/2013 Echo: EF 55-60%, mod MR; b. 02/2016 Echo: EF 55-60%, mod to sev MR directed posteriorly--felt to be dynamic -worse with volume overload; c. 07/2017 Echo [facility]): EF >55%. Mod MR.  . Myocardial infarct ([place]) 05/2013  . Peripheral vascular disease ([place])   . Sickle cell trait ([place])   . Thyroid nodule    [place] 06/2017 due to f/u US in 1 year    Past Surgical History:  Procedure Laterality Date  . A/V FISTULAGRAM Left 11/10/2017   Procedure: A/V FISTULAGRAM;  Surgeon:  Schnier, Dolores Lory, MD;  Location: [place] [facility];  Service: Cardiovascular;  Laterality: Left;  . ABDOMINAL HYSTERECTOMY     2000  . CARDIAC CATHETERIZATION    . CARDIAC CATHETERIZATION N/A 05/02/2015   Procedure: Left Heart Cath and Coronary Angiography;  Surgeon: [name], MD;  Location: [facility];  Service: Cardiovascular;  Laterality: N/A;  . COLONOSCOPY WITH PROPOFOL N/A 07/08/2016   Procedure: COLONOSCOPY WITH PROPOFOL;  Surgeon: [name], MD;  Location: [facility] ENDOSCOPY;  Service: Endoscopy;  Laterality: N/A;  . COLONOSCOPY WITH PROPOFOL N/A 08/20/2017   Procedure: COLONOSCOPY WITH PROPOFOL;  Surgeon: [name], MD;  Location: [facility] ENDOSCOPY;  Service: Gastroenterology;  Laterality: N/A;  . colonscopy    .  CORONARY ANGIOPLASTY  05/28/2014   stent placement to the mid RCA  . DILATION AND CURETTAGE OF UTERUS     several in the early 80's  . ESOPHAGOGASTRODUODENOSCOPY     2012  . ESOPHAGOGASTRODUODENOSCOPY (EGD) WITH PROPOFOL N/A 03/11/2018   Procedure: ESOPHAGOGASTRODUODENOSCOPY (EGD) WITH PROPOFOL;  Surgeon: [name], MD;  Location: [facility] ENDOSCOPY;  Service: Gastroenterology;  Laterality: N/A;  . EYE SURGERY     bilateral laser 2012  . EYE SURGERY     right  . EYE SURGERY     x4 both eyes  . GAS INSERTION  09/30/2011   Procedure: INSERTION OF GAS;  Surgeon: [name], MD;  Location: [place];  Service: Ophthalmology;  Laterality: Right;  [id number]  . GAS/FLUID EXCHANGE  09/30/2011   Procedure: GAS/FLUID EXCHANGE;  Surgeon: [name], MD;  Location: [place];  Service: Ophthalmology;  Laterality: Right;  . LEFT HEART CATHETERIZATION WITH CORONARY ANGIOGRAM N/A 05/28/2013   Procedure: LEFT HEART CATHETERIZATION WITH CORONARY ANGIOGRAM;  Surgeon: [name], MD;  Location: [facility] CATH LAB;  Service: Cardiovascular;  Laterality: N/A;  . PARS PLANA VITRECTOMY  04/22/2011   Procedure: PARS PLANA VITRECTOMY WITH 25 GAUGE;  Surgeon: [name], MD;  Location: [place];  Service: Ophthalmology;  Laterality: Left;  membrane peel, endolaser, gas fluid exchange, silicone oil, repair of complex traction retinal detachment  . PARS PLANA VITRECTOMY  09/30/2011   Procedure: PARS PLANA VITRECTOMY WITH 25 GAUGE;  Surgeon: [name], MD;  Location: [place];  Service: Ophthalmology;  Laterality: Right;  Endolaser; Repair of Complex Traction Retinal Detachment  . PARS PLANA VITRECTOMY  02/24/2012   Procedure: PARS PLANA VITRECTOMY WITH 25 GAUGE;  Surgeon: [name], MD;  Location: [place];  Service: Ophthalmology;  Laterality: Left;  . PTCA    . SILICON OIL REMOVAL  09/05/5186   Procedure: SILICON OIL REMOVAL;  Surgeon: [name], MD;  Location: [place];  Service: Ophthalmology;  Laterality:  Left;  . THROMBECTOMY / ARTERIOVENOUS GRAFT REVISION    . TUBAL LIGATION     1979   Family History  Problem Relation Age of Onset  . Stroke Mother   . Heart attack Mother   . Heart disease Mother   . Colon cancer Father   . Colon cancer Sister   . Heart attack Brother   . Stroke Brother   . Diabetes Brother   . Breast cancer Sister   . Diabetes Brother   . Diabetes Daughter   . Anesthesia problems Neg Hx   . Hypotension Neg Hx   . Malignant hyperthermia Neg Hx   . Pseudochol deficiency Neg Hx    Social History   Socioeconomic History  . Marital status:  Single    Spouse name: Not on file  . Number of children: 2  . Years of education: Not on file  . Highest education level: Not on file  Occupational History  . Not on file  Social Needs  . Financial resource strain: Not on file  . Food insecurity:    Worry: Not on file    Inability: Not on file  . Transportation needs:    Medical: Not on file    Non-medical: Not on file  Tobacco Use  . Smoking status: Former Smoker    Packs/day: 1.00    Years: 25.00    Pack years: 25.00    Last attempt to quit: 06/09/2010    Years since quitting: 7.9  . Smokeless tobacco: Never Used  Substance and Sexual Activity  . Alcohol use: No  . Drug use: No  . Sexual activity: Never  Lifestyle  . Physical activity:    Days per week: Not on file    Minutes per session: Not on file  . Stress: Not on file  Relationships  . Social connections:    Talks on phone: Not on file    Gets together: Not on file    Attends religious service: Not on file    Active member of club or organization: Not on file    Attends meetings of clubs or organizations: Not on file    Relationship status: Not on file  . Intimate partner violence:    Fear of current or ex partner: Not on file    Emotionally abused: Not on file    Physically abused: Not on file    Forced sexual activity: Not on file  Other Topics Concern  . Not on file  Social History  Narrative   On disability.    Lives with son Brenton Grills   2 children (has son and daughter)      Son drives her since her vision has decreased   Current Meds  Medication Sig  . albuterol (PROVENTIL) (2.5 MG/3ML) 0.083% nebulizer solution Take 2.5 mg by nebulization every 6 (six) hours as needed for wheezing or shortness of breath.  Marland Kitchen albuterol (VENTOLIN HFA) 108 (90 Base) MCG/ACT inhaler Inhale into the lungs every 6 (six) hours as needed for wheezing or shortness of breath.  Marland Kitchen amLODipine (NORVASC) 10 MG tablet Take 10 mg by mouth daily.   Marland Kitchen aspirin EC 81 MG tablet Take 81 mg by mouth at bedtime.   Marland Kitchen atorvastatin (LIPITOR) 40 MG tablet Take 1 tablet (40 mg total) by mouth daily. At night  . AURYXIA 1 GM 210 MG(Fe) tablet Take 420-630 mg by mouth 3 (three) times daily with meals. Take 630 mg with each meal and 420 mg with each snack  . carvedilol (COREG) 12.5 MG tablet Take 1.5 tablets (18.75 mg) by mouth twice daily  . cinacalcet (SENSIPAR) 60 MG tablet Take 60 mg by mouth daily.  . cloNIDine (CATAPRES) 0.1 MG tablet Take 0.1 mg by mouth 2 (two) times daily.  . furosemide (LASIX) 80 MG tablet Take 80 mg by mouth See admin instructions. Take 80 mg by mouth once daily on Sun, Tue, Thur, and Sat (non dialysis days)  . isosorbide mononitrate (IMDUR) 60 MG 24 hr tablet Take 1 tablet (60 mg total) by mouth daily.  Marland Kitchen losartan (COZAAR) 50 MG tablet Take by mouth.  . meloxicam (MOBIC) 7.5 MG tablet meloxicam 7.5 mg tablet  . multivitamin (RENA-VIT) TABS tablet Take 1 tablet by mouth daily.  Marland Kitchen  nitroGLYCERIN (NITROSTAT) 0.4 MG SL tablet Place 1 tablet (0.4 mg total) under the tongue every 5 (five) minutes as needed for chest pain.  Marland Kitchen ondansetron (ZOFRAN) 4 MG tablet Take 1 tablet (4 mg total) by mouth every 8 (eight) hours as needed for nausea or vomiting.  Marland Kitchen oxyCODONE-acetaminophen (PERCOCET/ROXICET) 5-325 MG tablet Take 1 tablet by mouth 2 (two) times daily as needed for pain.   . pregabalin (LYRICA)  75 MG capsule Take 1 capsule (75 mg total) by mouth daily.  Marland Kitchen umeclidinium-vilanterol (ANORO ELLIPTA) 62.5-25 MCG/INH AEPB Inhale 1 puff into the lungs daily.   Current Facility-Administered Medications for the 05/25/18 encounter (Office Visit) with McLean-Scocuzza, Nino Glow, MD  Medication  . betamethasone acetate-betamethasone sodium phosphate (CELESTONE) injection 3 mg   Allergies  Allergen Reactions  . Hydrocodone Hives  . Metformin Diarrhea and Other (See Comments)    Other reaction(s): Distress (finding)  . Lisinopril     Unknown reaction    Recent Results (from the past 2160 hour(s))  Surgical pathology     Status: None   Collection Time: 03/11/18  8:16 AM  Result Value Ref Range   SURGICAL PATHOLOGY      Surgical Pathology CASE: [id number] PATIENT: Marcial Pacas Surgical Pathology Report     SPECIMEN SUBMITTED: A. Stomach, random; cbx  CLINICAL HISTORY: None provided  PRE-OPERATIVE DIAGNOSIS: Nausea and vomiting R11.2  POST-OPERATIVE DIAGNOSIS: None provided.     DIAGNOSIS: A.  STOMACH, RANDOM; COLD BIOPSIES: - ANTRAL MUCOSA SHOWING MILD FOVEOLAR GLAND HYPERPLASIA. - NO EVIDENCE OF ACTIVE GASTRITIS.  GROSS DESCRIPTION: A. Labeled: Cbx random gastric Received: In formalin Tissue fragment(s): 2 Size: 0.3 and 0.5 cm Description: Tan to brown fragments Entirely submitted in one cassette.    Final Diagnosis performed by Galvin Proffer, MD.   Electronically signed 03/12/2018 2:03:52PM The electronic signature indicates that the named Attending Pathologist has evaluated the specimen  Technical component performed at [facility], [address], [place], [place] [address] Lab: [phone] Dir: Rush Farmer, MD, MMM  Professional component performed at [place], [facility], [place], [place], [place] [address] Lab: [phone] Dir: Dellia Nims. Rubinas, MD    Objective  Body mass index is 25.21 kg/m. Wt Readings from Last 3  Encounters:  05/25/18 163 lb 6.4 oz (74.1 kg)  04/01/18 163 lb (73.9 kg)  03/23/18 164 lb 6.4 oz (74.6 kg)   Temp Readings from Last 3 Encounters:  05/25/18 98 F (36.7 C) (Oral)  03/23/18 98.2 F (36.8 C) (Oral)  03/11/18 (!) 96.9 F (36.1 C) (Tympanic)   BP Readings from Last 3 Encounters:  05/25/18 (!) 112/58  04/01/18 130/62  03/23/18 (!) 140/58   Pulse Readings from Last 3 Encounters:  05/25/18 (!) 47  04/01/18 67  03/23/18 70    Physical Exam Vitals signs and nursing note reviewed.  Constitutional:      Appearance: Normal appearance.  HENT:     Head: Normocephalic and atraumatic.     Mouth/Throat:     Mouth: Mucous membranes are moist.     Pharynx: Oropharynx is clear.  Eyes:     Conjunctiva/sclera: Conjunctivae normal.     Pupils: Pupils are equal, round, and reactive to light.  Cardiovascular:     Rate and Rhythm: Normal rate and regular rhythm.     Heart sounds: Murmur present.  Pulmonary:     Effort: Pulmonary effort is normal.     Breath sounds: Normal breath sounds.  Skin:    General: Skin  is warm and dry.  Neurological:     General: No focal deficit present.     Mental Status: She is alert and oriented to person, place, and time.     Gait: Gait normal.  Psychiatric:        Attention and Perception: Attention and perception normal.        Mood and Affect: Mood and affect normal.        Speech: Speech normal.        Behavior: Behavior normal. Behavior is cooperative.        Thought Content: Thought content normal.        Cognition and Memory: Cognition and memory normal.        Judgment: Judgment normal.     Assessment   1. HTN controlled  2. Nausea likely related to medications see HPI vs dialysis will disc with renal about sensipar and if dialysis could be cause  3. OSA pending CPAP 06/03/18  4. Memory loss and shaking  5. hyperphos  6. ESRD on HD  7. HM  Plan   1. Cont meds  2. Disc with Dr. Holley Raring renal binders though pt reports she  tried 4 others in past and could not tolerate  Prn zofran  3. Will get cpap 06/03/18  4. Pending neurology referral from renal per pt  5. Pt to bring in copy of labs  Renal to adjust meds if needed  Given high phos diet list  6. Cont HD  7.  Flu shot utd  pna 23 utd  Tetanus UTD  Hep B immune  Consider shingrix in future disc today pt unsure if insurance will cover it   Pt to fax copy of HD labs done 04/2018 to office   Pap neg 11/13/16 neg HPVs/p hysterectomy mammo neg 03/02/18  h/o multiple polypscolonoscopy 08/20/17 repeat in 3 years [facility] -EGD 03/11/18 neg gastritis  Former smoker x 25-30 years <1 ppd CTA chest 01/12/18 moderate b/l pleural effusions and CAD and aortic atherosclerosis, no nodules  Does not make urine  Last A1C 4.87/8/19 with HD prior to that10/2018 5.6 per HD RN Last A1C 4.4 since last visit 03/2018 or 04/2018 off DM meds since 2016  [place] 06/16/17 right upper thyroid nodule ? Parathyroid mass 1.1 cm and left mid nodule f/u annually  -sch 08/06/18   Labs HD 05/17/18 Albumin 3.8, hbg 11.1, Calcium 8.2, phose 6.4 elevated nl 3.0-5.5 , PTH 534 nl 150-600, K 4.0 nl, vitamin D 7 (02/22/16)  Lipid 02/15/18 TC 175 Glucose 05/17/18 128 A1C 03/22/18 4.4  Provider: Dr. Olivia Mackie McLean-Scocuzza-Internal Medicine

## 2018-05-25 NOTE — Progress Notes (Signed)
Note has been sent electronically.

## 2018-05-25 NOTE — Patient Instructions (Addendum)
Could consider shingrix/shingles vaccine Please get copy of labs to me pretty please    Recombinant Zoster (Shingles) Vaccine, RZV: What You Need to Know 1. Why get vaccinated? Shingles (also called herpes zoster, or just zoster) is a painful skin rash, often with blisters. Shingles is caused by the varicella zoster virus, the same virus that causes chickenpox. After you have chickenpox, the virus stays in your body and can cause shingles later in life. You can't catch shingles from another person. However, a person who has never had chickenpox (or chickenpox vaccine) could get chickenpox from someone with shingles. A shingles rash usually appears on one side of the face or body and heals within 2 to 4 weeks. Its main symptom is pain, which can be severe. Other symptoms can include fever, headache, chills and upset stomach. Very rarely, a shingles infection can lead to pneumonia, hearing problems, blindness, brain inflammation (encephalitis), or death. For about 1 person in 5, severe pain can continue even long after the rash has cleared up. This long-lasting pain is called post-herpetic neuralgia (PHN). Shingles is far more common in people 39 years of age and older than in younger people, and the risk increases with age. It is also more common in people whose immune system is weakened because of a disease such as cancer, or by drugs such as steroids or chemotherapy. At least 1 million people a year in the [place] get shingles. 2. Shingles vaccine (recombinant) Recombinant shingles vaccine was approved by FDA in 2017 for the prevention of shingles. In clinical trials, it was more than 90% effective in preventing shingles. It can also reduce the likelihood of PHN. Two doses, 2 to 6 months apart, are recommended for adults 37 and older. This vaccine is also recommended for people who have already gotten the live shingles vaccine (Zostavax). There is no live virus in this vaccine. 3. Some people  should not get this vaccine Tell your vaccine provider if you:  Have any severe, life-threatening allergies. A person who has ever had a life-threatening allergic reaction after a dose of recombinant shingles vaccine, or has a severe allergy to any component of this vaccine, may be advised not to be vaccinated. Ask your health care provider if you want information about vaccine components.  Are pregnant or breastfeeding. There is not much information about use of recombinant shingles vaccine in pregnant or nursing women. Your healthcare provider might recommend delaying vaccination.  Are not feeling well. If you have a mild illness, such as a cold, you can probably get the vaccine today. If you are moderately or severely ill, you should probably wait until you recover. Your doctor can advise you.  4. Risks of a vaccine reaction With any medicine, including vaccines, there is a chance of reactions. After recombinant shingles vaccination, a person might experience:  Pain, redness, soreness, or swelling at the site of the injection  Headache, muscle aches, fever, shivering, fatigue  In clinical trials, most people got a sore arm with mild or moderate pain after vaccination, and some also had redness and swelling where they got the shot. Some people felt tired, had muscle pain, a headache, shivering, fever, stomach pain, or nausea. About 1 out of 6 people who got recombinant zoster vaccine experienced side effects that prevented them from doing regular activities. Symptoms went away on their own in about 2 to 3 days. Side effects were more common in younger people. You should still get the second dose of recombinant zoster  vaccine even if you had one of these reactions after the first dose. Other things that could happen after this vaccine:  People sometimes faint after medical procedures, including vaccination. Sitting or lying down for about 15 minutes can help prevent fainting and injuries caused  by a fall. Tell your provider if you feel dizzy or have vision changes or ringing in the ears.  Some people get shoulder pain that can be more severe and longer-lasting than routine soreness that can follow injections. This happens very rarely.  Any medication can cause a severe allergic reaction. Such reactions to a vaccine are estimated at about 1 in a million doses, and would happen within a few minutes to a few hours after the vaccination. As with any medicine, there is a very remote chance of a vaccine causing a serious injury or death. The safety of vaccines is always being monitored. For more information, visit: http://www.aguilar.org/ 5. What if there is a serious problem? What should I look for?  Look for anything that concerns you, such as signs of a severe allergic reaction, very high fever, or unusual behavior. Signs of a severe allergic reaction can include hives, swelling of the face and throat, difficulty breathing, a fast heartbeat, dizziness, and weakness. These would usually start a few minutes to a few hours after the vaccination. What should I do?  If you think it is a severe allergic reaction or other emergency that can't wait, call 9-1-1 and get to the nearest hospital. Otherwise, call your health care provider. Afterward, the reaction should be reported to the Vaccine Adverse Event Reporting System (VAERS). Your doctor should file this report, or you can do it yourself through the VAERS web site atwww.vaers.https://www.bray.com/ by calling (226) 132-6096. VAERS does not give medical advice. 6. How can I learn more?  Ask your healthcare provider. He or she can give you the vaccine package insert or suggest other sources of information.  Call your local or state health department.  Contact the Centers for Disease Control and Prevention (CDC): ? Call 808-705-6502 (1-800-CDC-INFO) or ? Visit the CDC's website at http://hunter.com/ CDC Vaccine Information Statement (VIS)  Recombinant Zoster Vaccine (07/21/2016) This information is not intended to replace advice given to you by your health care provider. Make sure you discuss any questions you have with your health care provider. Document Released: 08/05/2016 Document Revised: 08/05/2016 Document Reviewed: 08/05/2016 Elsevier Interactive Patient Education  [employer].

## 2018-05-26 DIAGNOSIS — Z992 Dependence on renal dialysis: Secondary | ICD-10-CM | POA: Diagnosis not present

## 2018-05-26 DIAGNOSIS — N186 End stage renal disease: Secondary | ICD-10-CM | POA: Diagnosis not present

## 2018-05-26 DIAGNOSIS — D631 Anemia in chronic kidney disease: Secondary | ICD-10-CM | POA: Diagnosis not present

## 2018-05-26 DIAGNOSIS — N2581 Secondary hyperparathyroidism of renal origin: Secondary | ICD-10-CM | POA: Diagnosis not present

## 2018-05-26 DIAGNOSIS — D509 Iron deficiency anemia, unspecified: Secondary | ICD-10-CM | POA: Diagnosis not present

## 2018-05-28 DIAGNOSIS — D631 Anemia in chronic kidney disease: Secondary | ICD-10-CM | POA: Diagnosis not present

## 2018-05-28 DIAGNOSIS — N186 End stage renal disease: Secondary | ICD-10-CM | POA: Diagnosis not present

## 2018-05-28 DIAGNOSIS — N2581 Secondary hyperparathyroidism of renal origin: Secondary | ICD-10-CM | POA: Diagnosis not present

## 2018-05-28 DIAGNOSIS — D509 Iron deficiency anemia, unspecified: Secondary | ICD-10-CM | POA: Diagnosis not present

## 2018-05-28 DIAGNOSIS — Z992 Dependence on renal dialysis: Secondary | ICD-10-CM | POA: Diagnosis not present

## 2018-05-30 DIAGNOSIS — D631 Anemia in chronic kidney disease: Secondary | ICD-10-CM | POA: Diagnosis not present

## 2018-05-30 DIAGNOSIS — D509 Iron deficiency anemia, unspecified: Secondary | ICD-10-CM | POA: Diagnosis not present

## 2018-05-30 DIAGNOSIS — Z992 Dependence on renal dialysis: Secondary | ICD-10-CM | POA: Diagnosis not present

## 2018-05-30 DIAGNOSIS — N186 End stage renal disease: Secondary | ICD-10-CM | POA: Diagnosis not present

## 2018-05-30 DIAGNOSIS — N2581 Secondary hyperparathyroidism of renal origin: Secondary | ICD-10-CM | POA: Diagnosis not present

## 2018-06-01 DIAGNOSIS — D631 Anemia in chronic kidney disease: Secondary | ICD-10-CM | POA: Diagnosis not present

## 2018-06-01 DIAGNOSIS — D509 Iron deficiency anemia, unspecified: Secondary | ICD-10-CM | POA: Diagnosis not present

## 2018-06-01 DIAGNOSIS — N186 End stage renal disease: Secondary | ICD-10-CM | POA: Diagnosis not present

## 2018-06-01 DIAGNOSIS — Z992 Dependence on renal dialysis: Secondary | ICD-10-CM | POA: Diagnosis not present

## 2018-06-01 DIAGNOSIS — N2581 Secondary hyperparathyroidism of renal origin: Secondary | ICD-10-CM | POA: Diagnosis not present

## 2018-06-04 DIAGNOSIS — N186 End stage renal disease: Secondary | ICD-10-CM | POA: Diagnosis not present

## 2018-06-04 DIAGNOSIS — N2581 Secondary hyperparathyroidism of renal origin: Secondary | ICD-10-CM | POA: Diagnosis not present

## 2018-06-04 DIAGNOSIS — D631 Anemia in chronic kidney disease: Secondary | ICD-10-CM | POA: Diagnosis not present

## 2018-06-04 DIAGNOSIS — D509 Iron deficiency anemia, unspecified: Secondary | ICD-10-CM | POA: Diagnosis not present

## 2018-06-04 DIAGNOSIS — Z992 Dependence on renal dialysis: Secondary | ICD-10-CM | POA: Diagnosis not present

## 2018-06-07 DIAGNOSIS — N2581 Secondary hyperparathyroidism of renal origin: Secondary | ICD-10-CM | POA: Diagnosis not present

## 2018-06-07 DIAGNOSIS — D509 Iron deficiency anemia, unspecified: Secondary | ICD-10-CM | POA: Diagnosis not present

## 2018-06-07 DIAGNOSIS — Z992 Dependence on renal dialysis: Secondary | ICD-10-CM | POA: Diagnosis not present

## 2018-06-07 DIAGNOSIS — D631 Anemia in chronic kidney disease: Secondary | ICD-10-CM | POA: Diagnosis not present

## 2018-06-07 DIAGNOSIS — N186 End stage renal disease: Secondary | ICD-10-CM | POA: Diagnosis not present

## 2018-06-08 DIAGNOSIS — N186 End stage renal disease: Secondary | ICD-10-CM | POA: Diagnosis not present

## 2018-06-08 DIAGNOSIS — Z992 Dependence on renal dialysis: Secondary | ICD-10-CM | POA: Diagnosis not present

## 2018-06-09 DIAGNOSIS — N2581 Secondary hyperparathyroidism of renal origin: Secondary | ICD-10-CM | POA: Diagnosis not present

## 2018-06-09 DIAGNOSIS — D631 Anemia in chronic kidney disease: Secondary | ICD-10-CM | POA: Diagnosis not present

## 2018-06-09 DIAGNOSIS — L299 Pruritus, unspecified: Secondary | ICD-10-CM | POA: Diagnosis not present

## 2018-06-09 DIAGNOSIS — Z992 Dependence on renal dialysis: Secondary | ICD-10-CM | POA: Diagnosis not present

## 2018-06-09 DIAGNOSIS — T782XXA Anaphylactic shock, unspecified, initial encounter: Secondary | ICD-10-CM | POA: Diagnosis not present

## 2018-06-09 DIAGNOSIS — N186 End stage renal disease: Secondary | ICD-10-CM | POA: Diagnosis not present

## 2018-06-09 DIAGNOSIS — D509 Iron deficiency anemia, unspecified: Secondary | ICD-10-CM | POA: Diagnosis not present

## 2018-06-11 DIAGNOSIS — N2581 Secondary hyperparathyroidism of renal origin: Secondary | ICD-10-CM | POA: Diagnosis not present

## 2018-06-11 DIAGNOSIS — T782XXA Anaphylactic shock, unspecified, initial encounter: Secondary | ICD-10-CM | POA: Diagnosis not present

## 2018-06-11 DIAGNOSIS — L299 Pruritus, unspecified: Secondary | ICD-10-CM | POA: Diagnosis not present

## 2018-06-11 DIAGNOSIS — D509 Iron deficiency anemia, unspecified: Secondary | ICD-10-CM | POA: Diagnosis not present

## 2018-06-11 DIAGNOSIS — N186 End stage renal disease: Secondary | ICD-10-CM | POA: Diagnosis not present

## 2018-06-11 DIAGNOSIS — Z992 Dependence on renal dialysis: Secondary | ICD-10-CM | POA: Diagnosis not present

## 2018-06-14 ENCOUNTER — Encounter: Admit: 2018-06-14 | Discharge: 2018-06-14 | Payer: MEDICARE | Attending: Nephrology | Primary: Nephrology

## 2018-06-14 DIAGNOSIS — N186 End stage renal disease: Secondary | ICD-10-CM | POA: Diagnosis not present

## 2018-06-14 DIAGNOSIS — D509 Iron deficiency anemia, unspecified: Secondary | ICD-10-CM | POA: Diagnosis not present

## 2018-06-14 DIAGNOSIS — N2581 Secondary hyperparathyroidism of renal origin: Secondary | ICD-10-CM | POA: Diagnosis not present

## 2018-06-14 DIAGNOSIS — T782XXA Anaphylactic shock, unspecified, initial encounter: Secondary | ICD-10-CM | POA: Diagnosis not present

## 2018-06-14 DIAGNOSIS — Z992 Dependence on renal dialysis: Secondary | ICD-10-CM | POA: Diagnosis not present

## 2018-06-14 DIAGNOSIS — L299 Pruritus, unspecified: Secondary | ICD-10-CM | POA: Diagnosis not present

## 2018-06-16 DIAGNOSIS — D509 Iron deficiency anemia, unspecified: Secondary | ICD-10-CM | POA: Diagnosis not present

## 2018-06-16 DIAGNOSIS — T782XXA Anaphylactic shock, unspecified, initial encounter: Secondary | ICD-10-CM | POA: Diagnosis not present

## 2018-06-16 DIAGNOSIS — Z992 Dependence on renal dialysis: Secondary | ICD-10-CM | POA: Diagnosis not present

## 2018-06-16 DIAGNOSIS — N186 End stage renal disease: Secondary | ICD-10-CM | POA: Diagnosis not present

## 2018-06-16 DIAGNOSIS — N2581 Secondary hyperparathyroidism of renal origin: Secondary | ICD-10-CM | POA: Diagnosis not present

## 2018-06-16 DIAGNOSIS — L299 Pruritus, unspecified: Secondary | ICD-10-CM | POA: Diagnosis not present

## 2018-06-18 DIAGNOSIS — Z7682 Awaiting organ transplant status: Principal | ICD-10-CM

## 2018-06-18 DIAGNOSIS — N186 End stage renal disease: Secondary | ICD-10-CM

## 2018-06-18 DIAGNOSIS — Z01818 Encounter for other preprocedural examination: Secondary | ICD-10-CM

## 2018-06-18 DIAGNOSIS — D509 Iron deficiency anemia, unspecified: Secondary | ICD-10-CM | POA: Diagnosis not present

## 2018-06-18 DIAGNOSIS — L299 Pruritus, unspecified: Secondary | ICD-10-CM | POA: Diagnosis not present

## 2018-06-18 DIAGNOSIS — N2581 Secondary hyperparathyroidism of renal origin: Secondary | ICD-10-CM | POA: Diagnosis not present

## 2018-06-18 DIAGNOSIS — Z992 Dependence on renal dialysis: Secondary | ICD-10-CM | POA: Diagnosis not present

## 2018-06-18 DIAGNOSIS — T782XXA Anaphylactic shock, unspecified, initial encounter: Secondary | ICD-10-CM | POA: Diagnosis not present

## 2018-06-19 DIAGNOSIS — N2581 Secondary hyperparathyroidism of renal origin: Secondary | ICD-10-CM | POA: Diagnosis not present

## 2018-06-19 DIAGNOSIS — Z992 Dependence on renal dialysis: Secondary | ICD-10-CM | POA: Diagnosis not present

## 2018-06-19 DIAGNOSIS — N186 End stage renal disease: Secondary | ICD-10-CM | POA: Diagnosis not present

## 2018-06-19 DIAGNOSIS — L299 Pruritus, unspecified: Secondary | ICD-10-CM | POA: Diagnosis not present

## 2018-06-19 DIAGNOSIS — T782XXA Anaphylactic shock, unspecified, initial encounter: Secondary | ICD-10-CM | POA: Diagnosis not present

## 2018-06-19 DIAGNOSIS — D509 Iron deficiency anemia, unspecified: Secondary | ICD-10-CM | POA: Diagnosis not present

## 2018-06-21 DIAGNOSIS — T782XXA Anaphylactic shock, unspecified, initial encounter: Secondary | ICD-10-CM | POA: Diagnosis not present

## 2018-06-21 DIAGNOSIS — N186 End stage renal disease: Secondary | ICD-10-CM | POA: Diagnosis not present

## 2018-06-21 DIAGNOSIS — N2581 Secondary hyperparathyroidism of renal origin: Secondary | ICD-10-CM | POA: Diagnosis not present

## 2018-06-21 DIAGNOSIS — Z992 Dependence on renal dialysis: Secondary | ICD-10-CM | POA: Diagnosis not present

## 2018-06-21 DIAGNOSIS — L299 Pruritus, unspecified: Secondary | ICD-10-CM | POA: Diagnosis not present

## 2018-06-21 DIAGNOSIS — E119 Type 2 diabetes mellitus without complications: Secondary | ICD-10-CM | POA: Diagnosis not present

## 2018-06-21 DIAGNOSIS — D509 Iron deficiency anemia, unspecified: Secondary | ICD-10-CM | POA: Diagnosis not present

## 2018-06-23 DIAGNOSIS — Z992 Dependence on renal dialysis: Secondary | ICD-10-CM | POA: Diagnosis not present

## 2018-06-23 DIAGNOSIS — T782XXA Anaphylactic shock, unspecified, initial encounter: Secondary | ICD-10-CM | POA: Diagnosis not present

## 2018-06-23 DIAGNOSIS — N186 End stage renal disease: Secondary | ICD-10-CM | POA: Diagnosis not present

## 2018-06-23 DIAGNOSIS — D509 Iron deficiency anemia, unspecified: Secondary | ICD-10-CM | POA: Diagnosis not present

## 2018-06-23 DIAGNOSIS — N2581 Secondary hyperparathyroidism of renal origin: Secondary | ICD-10-CM | POA: Diagnosis not present

## 2018-06-23 DIAGNOSIS — L299 Pruritus, unspecified: Secondary | ICD-10-CM | POA: Diagnosis not present

## 2018-06-24 ENCOUNTER — Ambulatory Visit: Payer: Medicare Other | Attending: Internal Medicine

## 2018-06-25 DIAGNOSIS — D509 Iron deficiency anemia, unspecified: Secondary | ICD-10-CM | POA: Diagnosis not present

## 2018-06-25 DIAGNOSIS — T782XXA Anaphylactic shock, unspecified, initial encounter: Secondary | ICD-10-CM | POA: Diagnosis not present

## 2018-06-25 DIAGNOSIS — N186 End stage renal disease: Secondary | ICD-10-CM | POA: Diagnosis not present

## 2018-06-25 DIAGNOSIS — Z992 Dependence on renal dialysis: Secondary | ICD-10-CM | POA: Diagnosis not present

## 2018-06-25 DIAGNOSIS — N2581 Secondary hyperparathyroidism of renal origin: Secondary | ICD-10-CM | POA: Diagnosis not present

## 2018-06-25 DIAGNOSIS — L299 Pruritus, unspecified: Secondary | ICD-10-CM | POA: Diagnosis not present

## 2018-06-28 DIAGNOSIS — N186 End stage renal disease: Secondary | ICD-10-CM | POA: Diagnosis not present

## 2018-06-28 DIAGNOSIS — D509 Iron deficiency anemia, unspecified: Secondary | ICD-10-CM | POA: Diagnosis not present

## 2018-06-28 DIAGNOSIS — Z992 Dependence on renal dialysis: Secondary | ICD-10-CM | POA: Diagnosis not present

## 2018-06-28 DIAGNOSIS — N2581 Secondary hyperparathyroidism of renal origin: Secondary | ICD-10-CM | POA: Diagnosis not present

## 2018-06-28 DIAGNOSIS — T782XXA Anaphylactic shock, unspecified, initial encounter: Secondary | ICD-10-CM | POA: Diagnosis not present

## 2018-06-28 DIAGNOSIS — L299 Pruritus, unspecified: Secondary | ICD-10-CM | POA: Diagnosis not present

## 2018-06-29 DIAGNOSIS — H43313 Vitreous membranes and strands, bilateral: Secondary | ICD-10-CM | POA: Diagnosis not present

## 2018-06-29 DIAGNOSIS — E11319 Type 2 diabetes mellitus with unspecified diabetic retinopathy without macular edema: Secondary | ICD-10-CM | POA: Diagnosis not present

## 2018-06-29 LAB — HM DIABETES EYE EXAM

## 2018-06-30 DIAGNOSIS — N2581 Secondary hyperparathyroidism of renal origin: Secondary | ICD-10-CM | POA: Diagnosis not present

## 2018-06-30 DIAGNOSIS — D509 Iron deficiency anemia, unspecified: Secondary | ICD-10-CM | POA: Diagnosis not present

## 2018-06-30 DIAGNOSIS — L299 Pruritus, unspecified: Secondary | ICD-10-CM | POA: Diagnosis not present

## 2018-06-30 DIAGNOSIS — T782XXA Anaphylactic shock, unspecified, initial encounter: Secondary | ICD-10-CM | POA: Diagnosis not present

## 2018-06-30 DIAGNOSIS — Z992 Dependence on renal dialysis: Secondary | ICD-10-CM | POA: Diagnosis not present

## 2018-06-30 DIAGNOSIS — N186 End stage renal disease: Secondary | ICD-10-CM | POA: Diagnosis not present

## 2018-07-02 DIAGNOSIS — T782XXA Anaphylactic shock, unspecified, initial encounter: Secondary | ICD-10-CM | POA: Diagnosis not present

## 2018-07-02 DIAGNOSIS — N186 End stage renal disease: Secondary | ICD-10-CM | POA: Diagnosis not present

## 2018-07-02 DIAGNOSIS — Z992 Dependence on renal dialysis: Secondary | ICD-10-CM | POA: Diagnosis not present

## 2018-07-02 DIAGNOSIS — N2581 Secondary hyperparathyroidism of renal origin: Secondary | ICD-10-CM | POA: Diagnosis not present

## 2018-07-02 DIAGNOSIS — L299 Pruritus, unspecified: Secondary | ICD-10-CM | POA: Diagnosis not present

## 2018-07-02 DIAGNOSIS — D509 Iron deficiency anemia, unspecified: Secondary | ICD-10-CM | POA: Diagnosis not present

## 2018-07-05 ENCOUNTER — Encounter: Payer: Self-pay | Admitting: Internal Medicine

## 2018-07-05 DIAGNOSIS — N2581 Secondary hyperparathyroidism of renal origin: Secondary | ICD-10-CM | POA: Diagnosis not present

## 2018-07-05 DIAGNOSIS — Z992 Dependence on renal dialysis: Secondary | ICD-10-CM | POA: Diagnosis not present

## 2018-07-05 DIAGNOSIS — N186 End stage renal disease: Secondary | ICD-10-CM | POA: Diagnosis not present

## 2018-07-05 DIAGNOSIS — L299 Pruritus, unspecified: Secondary | ICD-10-CM | POA: Diagnosis not present

## 2018-07-05 DIAGNOSIS — D509 Iron deficiency anemia, unspecified: Secondary | ICD-10-CM | POA: Diagnosis not present

## 2018-07-05 DIAGNOSIS — T782XXA Anaphylactic shock, unspecified, initial encounter: Secondary | ICD-10-CM | POA: Diagnosis not present

## 2018-07-07 ENCOUNTER — Encounter: Payer: Self-pay | Admitting: Internal Medicine

## 2018-07-07 DIAGNOSIS — T782XXA Anaphylactic shock, unspecified, initial encounter: Secondary | ICD-10-CM | POA: Diagnosis not present

## 2018-07-07 DIAGNOSIS — N186 End stage renal disease: Secondary | ICD-10-CM | POA: Diagnosis not present

## 2018-07-07 DIAGNOSIS — Z992 Dependence on renal dialysis: Secondary | ICD-10-CM | POA: Diagnosis not present

## 2018-07-07 DIAGNOSIS — D509 Iron deficiency anemia, unspecified: Secondary | ICD-10-CM | POA: Diagnosis not present

## 2018-07-07 DIAGNOSIS — N2581 Secondary hyperparathyroidism of renal origin: Secondary | ICD-10-CM | POA: Diagnosis not present

## 2018-07-07 DIAGNOSIS — L299 Pruritus, unspecified: Secondary | ICD-10-CM | POA: Diagnosis not present

## 2018-07-09 DIAGNOSIS — D509 Iron deficiency anemia, unspecified: Secondary | ICD-10-CM | POA: Diagnosis not present

## 2018-07-09 DIAGNOSIS — Z992 Dependence on renal dialysis: Secondary | ICD-10-CM | POA: Diagnosis not present

## 2018-07-09 DIAGNOSIS — N186 End stage renal disease: Secondary | ICD-10-CM | POA: Diagnosis not present

## 2018-07-09 DIAGNOSIS — N2581 Secondary hyperparathyroidism of renal origin: Secondary | ICD-10-CM | POA: Diagnosis not present

## 2018-07-09 DIAGNOSIS — T782XXA Anaphylactic shock, unspecified, initial encounter: Secondary | ICD-10-CM | POA: Diagnosis not present

## 2018-07-09 DIAGNOSIS — L299 Pruritus, unspecified: Secondary | ICD-10-CM | POA: Diagnosis not present

## 2018-07-10 DIAGNOSIS — D631 Anemia in chronic kidney disease: Secondary | ICD-10-CM | POA: Diagnosis not present

## 2018-07-10 DIAGNOSIS — D509 Iron deficiency anemia, unspecified: Secondary | ICD-10-CM | POA: Diagnosis not present

## 2018-07-10 DIAGNOSIS — N186 End stage renal disease: Secondary | ICD-10-CM | POA: Diagnosis not present

## 2018-07-10 DIAGNOSIS — Z992 Dependence on renal dialysis: Secondary | ICD-10-CM | POA: Diagnosis not present

## 2018-07-10 DIAGNOSIS — N2581 Secondary hyperparathyroidism of renal origin: Secondary | ICD-10-CM | POA: Diagnosis not present

## 2018-07-12 DIAGNOSIS — D509 Iron deficiency anemia, unspecified: Secondary | ICD-10-CM | POA: Diagnosis not present

## 2018-07-12 DIAGNOSIS — D631 Anemia in chronic kidney disease: Secondary | ICD-10-CM | POA: Diagnosis not present

## 2018-07-12 DIAGNOSIS — Z992 Dependence on renal dialysis: Secondary | ICD-10-CM | POA: Diagnosis not present

## 2018-07-12 DIAGNOSIS — N2581 Secondary hyperparathyroidism of renal origin: Secondary | ICD-10-CM | POA: Diagnosis not present

## 2018-07-12 DIAGNOSIS — N186 End stage renal disease: Secondary | ICD-10-CM | POA: Diagnosis not present

## 2018-07-13 DIAGNOSIS — M5136 Other intervertebral disc degeneration, lumbar region: Secondary | ICD-10-CM | POA: Diagnosis not present

## 2018-07-13 DIAGNOSIS — M5416 Radiculopathy, lumbar region: Secondary | ICD-10-CM | POA: Diagnosis not present

## 2018-07-14 DIAGNOSIS — N2581 Secondary hyperparathyroidism of renal origin: Secondary | ICD-10-CM | POA: Diagnosis not present

## 2018-07-14 DIAGNOSIS — D509 Iron deficiency anemia, unspecified: Secondary | ICD-10-CM | POA: Diagnosis not present

## 2018-07-14 DIAGNOSIS — N186 End stage renal disease: Secondary | ICD-10-CM | POA: Diagnosis not present

## 2018-07-14 DIAGNOSIS — D631 Anemia in chronic kidney disease: Secondary | ICD-10-CM | POA: Diagnosis not present

## 2018-07-14 DIAGNOSIS — Z992 Dependence on renal dialysis: Secondary | ICD-10-CM | POA: Diagnosis not present

## 2018-07-16 DIAGNOSIS — N186 End stage renal disease: Secondary | ICD-10-CM | POA: Diagnosis not present

## 2018-07-16 DIAGNOSIS — D509 Iron deficiency anemia, unspecified: Secondary | ICD-10-CM | POA: Diagnosis not present

## 2018-07-16 DIAGNOSIS — N2581 Secondary hyperparathyroidism of renal origin: Secondary | ICD-10-CM | POA: Diagnosis not present

## 2018-07-16 DIAGNOSIS — D631 Anemia in chronic kidney disease: Secondary | ICD-10-CM | POA: Diagnosis not present

## 2018-07-16 DIAGNOSIS — Z992 Dependence on renal dialysis: Secondary | ICD-10-CM | POA: Diagnosis not present

## 2018-07-19 ENCOUNTER — Encounter: Admit: 2018-07-19 | Discharge: 2018-07-19 | Payer: MEDICARE | Attending: Nephrology | Primary: Nephrology

## 2018-07-19 DIAGNOSIS — N186 End stage renal disease: Secondary | ICD-10-CM | POA: Diagnosis not present

## 2018-07-19 DIAGNOSIS — Z992 Dependence on renal dialysis: Secondary | ICD-10-CM | POA: Diagnosis not present

## 2018-07-19 DIAGNOSIS — D509 Iron deficiency anemia, unspecified: Secondary | ICD-10-CM | POA: Diagnosis not present

## 2018-07-19 DIAGNOSIS — N2581 Secondary hyperparathyroidism of renal origin: Secondary | ICD-10-CM | POA: Diagnosis not present

## 2018-07-19 DIAGNOSIS — D631 Anemia in chronic kidney disease: Secondary | ICD-10-CM | POA: Diagnosis not present

## 2018-07-21 DIAGNOSIS — D631 Anemia in chronic kidney disease: Secondary | ICD-10-CM | POA: Diagnosis not present

## 2018-07-21 DIAGNOSIS — Z992 Dependence on renal dialysis: Secondary | ICD-10-CM | POA: Diagnosis not present

## 2018-07-21 DIAGNOSIS — D509 Iron deficiency anemia, unspecified: Secondary | ICD-10-CM | POA: Diagnosis not present

## 2018-07-21 DIAGNOSIS — N2581 Secondary hyperparathyroidism of renal origin: Secondary | ICD-10-CM | POA: Diagnosis not present

## 2018-07-21 DIAGNOSIS — N186 End stage renal disease: Secondary | ICD-10-CM | POA: Diagnosis not present

## 2018-07-22 ENCOUNTER — Ambulatory Visit (INDEPENDENT_AMBULATORY_CARE_PROVIDER_SITE_OTHER): Payer: Medicare Other | Admitting: Vascular Surgery

## 2018-07-22 ENCOUNTER — Encounter (INDEPENDENT_AMBULATORY_CARE_PROVIDER_SITE_OTHER): Payer: Medicare Other

## 2018-07-23 DIAGNOSIS — Z7682 Awaiting organ transplant status: Principal | ICD-10-CM

## 2018-07-23 DIAGNOSIS — N186 End stage renal disease: Secondary | ICD-10-CM

## 2018-07-23 DIAGNOSIS — Z01818 Encounter for other preprocedural examination: Secondary | ICD-10-CM

## 2018-07-23 DIAGNOSIS — D509 Iron deficiency anemia, unspecified: Secondary | ICD-10-CM | POA: Diagnosis not present

## 2018-07-23 DIAGNOSIS — Z992 Dependence on renal dialysis: Secondary | ICD-10-CM | POA: Diagnosis not present

## 2018-07-23 DIAGNOSIS — D631 Anemia in chronic kidney disease: Secondary | ICD-10-CM | POA: Diagnosis not present

## 2018-07-23 DIAGNOSIS — N2581 Secondary hyperparathyroidism of renal origin: Secondary | ICD-10-CM | POA: Diagnosis not present

## 2018-07-24 DIAGNOSIS — N2581 Secondary hyperparathyroidism of renal origin: Secondary | ICD-10-CM | POA: Diagnosis not present

## 2018-07-24 DIAGNOSIS — D631 Anemia in chronic kidney disease: Secondary | ICD-10-CM | POA: Diagnosis not present

## 2018-07-24 DIAGNOSIS — N186 End stage renal disease: Secondary | ICD-10-CM | POA: Diagnosis not present

## 2018-07-24 DIAGNOSIS — D509 Iron deficiency anemia, unspecified: Secondary | ICD-10-CM | POA: Diagnosis not present

## 2018-07-24 DIAGNOSIS — Z992 Dependence on renal dialysis: Secondary | ICD-10-CM | POA: Diagnosis not present

## 2018-07-26 DIAGNOSIS — D631 Anemia in chronic kidney disease: Secondary | ICD-10-CM | POA: Diagnosis not present

## 2018-07-26 DIAGNOSIS — Z992 Dependence on renal dialysis: Secondary | ICD-10-CM | POA: Diagnosis not present

## 2018-07-26 DIAGNOSIS — N2581 Secondary hyperparathyroidism of renal origin: Secondary | ICD-10-CM | POA: Diagnosis not present

## 2018-07-26 DIAGNOSIS — N186 End stage renal disease: Secondary | ICD-10-CM | POA: Diagnosis not present

## 2018-07-26 DIAGNOSIS — D509 Iron deficiency anemia, unspecified: Secondary | ICD-10-CM | POA: Diagnosis not present

## 2018-07-27 ENCOUNTER — Encounter (INDEPENDENT_AMBULATORY_CARE_PROVIDER_SITE_OTHER): Payer: Self-pay | Admitting: Vascular Surgery

## 2018-07-27 ENCOUNTER — Ambulatory Visit (INDEPENDENT_AMBULATORY_CARE_PROVIDER_SITE_OTHER): Payer: Medicare Other | Admitting: Vascular Surgery

## 2018-07-27 ENCOUNTER — Other Ambulatory Visit: Payer: Self-pay

## 2018-07-27 ENCOUNTER — Ambulatory Visit (INDEPENDENT_AMBULATORY_CARE_PROVIDER_SITE_OTHER): Payer: Medicare Other

## 2018-07-27 VITALS — BP 157/65 | HR 70 | Resp 10 | Ht 67.5 in | Wt 161.0 lb

## 2018-07-27 DIAGNOSIS — Z794 Long term (current) use of insulin: Secondary | ICD-10-CM

## 2018-07-27 DIAGNOSIS — Z992 Dependence on renal dialysis: Secondary | ICD-10-CM | POA: Diagnosis not present

## 2018-07-27 DIAGNOSIS — N186 End stage renal disease: Secondary | ICD-10-CM

## 2018-07-27 DIAGNOSIS — T829XXD Unspecified complication of cardiac and vascular prosthetic device, implant and graft, subsequent encounter: Secondary | ICD-10-CM | POA: Diagnosis not present

## 2018-07-27 DIAGNOSIS — T829XXS Unspecified complication of cardiac and vascular prosthetic device, implant and graft, sequela: Secondary | ICD-10-CM

## 2018-07-27 DIAGNOSIS — E1122 Type 2 diabetes mellitus with diabetic chronic kidney disease: Secondary | ICD-10-CM | POA: Diagnosis not present

## 2018-07-27 NOTE — Progress Notes (Signed)
Subjective:    Patient ID: Theresa [name], female    DOB: [birth year], 63 y.o.   MRN: [id number] Chief Complaint  Patient presents with  . Follow-up    Patient presents for a 7-month HDA follow-up. The patient underwent a duplex ultrasound of the AV access which was notable for a patent fistula without any significant hemodynamic stenosis.  There is an area located in the distal graft with an aneurysm with internal flow measuring 1.5cm x 1.1cm x 0.47cm.  Total flow volume is 1660.  There is antegrade biphasic flow in the left distal radial artery.  The patient denies any issues with hemodialysis such as cannulation problems, increased bleeding, decrease in doppler flow or recirculation. The patient also denies any fistula skin breakdown, pain, edema, pallor or ulceration of the arm / hand.  Patient denies any uremic symptoms.  Patient denies any fever, nausea vomiting.   Review of Systems  Constitutional: Negative.   HENT: Negative.   Eyes: Negative.   Respiratory: Negative.   Cardiovascular: Negative.   Gastrointestinal: Negative.   Endocrine: Negative.   Genitourinary: Negative.   Musculoskeletal: Negative.   Skin: Negative.   Allergic/Immunologic: Negative.   Neurological: Negative.   Hematological: Negative.   Psychiatric/Behavioral: Negative.       Objective:   Physical Exam Vitals signs reviewed.  Constitutional:      Appearance: Normal appearance.  HENT:     Head: Normocephalic and atraumatic.     Right Ear: External ear normal.     Left Ear: External ear normal.     Nose: Nose normal.     Mouth/Throat:     Mouth: Mucous membranes are dry.     Pharynx: Oropharynx is clear.  Eyes:     Extraocular Movements: Extraocular movements intact.     Conjunctiva/sclera: Conjunctivae normal.     Pupils: Pupils are equal, round, and reactive to light.  Neck:     Musculoskeletal: Normal range of motion.  Cardiovascular:     Rate and Rhythm: Normal rate and regular rhythm.   Comments: Left upper extremity: Forearm loop graft with good bruit and thrill.  Skin is intact. Pulmonary:     Effort: Pulmonary effort is normal.     Breath sounds: Normal breath sounds.  Musculoskeletal: Normal range of motion.        General: No swelling.  Skin:    General: Skin is warm and dry.  Neurological:     General: No focal deficit present.     Mental Status: She is alert and oriented to person, place, and time. Mental status is at baseline.  Psychiatric:        Mood and Affect: Mood normal.        Behavior: Behavior normal.        Thought Content: Thought content normal.        Judgment: Judgment normal.    BP (!) 157/65 (BP Location: Right Arm, Patient Position: Sitting, Cuff Size: Small)   Pulse 70   Resp 10   Ht 5' 7.5" (1.715 m)   Wt 161 lb (73 kg)   BMI 24.84 kg/m    Past Medical History:  Diagnosis Date  . Anemia of chronic disease   . Anginal pain ([place])   . Carotid arterial disease ([place])    a. 02/2013 U/S: 40-59% bilat ICA stenosis.  . Chronic constipation   . Chronic diastolic CHF (congestive heart failure) ([place])    a. 10/2013 Echo [facility]): EF 55-60%, mod conc LVH,  mod MR, mildly dil LA, mild Ao sclerosis w/o stenosis; b. 02/2016 Echo: EF 55-60%, no rwma, Gr DD, mild AS, mod to sev MR, mildly dil LA, PASP 40mmHg; c. 07/2017 Echo [facility]): EF >55%, mild to mod LVH, Gr2 DD, Ao scl, Mod MR, mod dil LA, nl RV fxn.  . Colon polyps   . COPD (chronic obstructive pulmonary disease) ([place])   . Coronary artery disease    a. 05/2013 NSTEMI/PCI: LM 20d, LAD min irregs, LCX small, nl, OM1 nl, RCA dom 4m (2.5x16 Promus DES), PDA1 80p; b. 04/2015 Cath: LM nl, LAD 66m, D1/2 min irregs, LCX 35p/m, OM2/3 min irregs, RCA patent mid stent, RPDA 70ost, RPLB1 30, RPLB2/3 min irregs, EF 55-65%--> Med Rx; c. 06/2017 MV [facility]): No ischemia/infarct, EF 64%.  . Diabetes mellitus   . Diabetic neuropathy ([place])   . Diabetic retinopathy ([place]) 05/28/2013   Hx bilat retinal detachment,  proliferative diab retinopathy and bilat vitreous hemorrhage   . Emphysema   . ESRD on hemodialysis ([place])    a. [facility] in [place], [place] [phone]/Dr. [name], on a MWF schedule.  She started dialysis in Feb 2014.  Etiology of renal failure not known, likely diabetes.  Has a left upper arm AV graft.  . History of bronchitis    Mar 2012  . History of pneumonia    June 2012  . History of tobacco abuse    a. Quit 2012.  Marland Kitchen Hyperlipidemia   . Hypertension   . Moderate to severe mitral insufficiency    a. 10/2013 Echo: EF 55-60%, mod MR; b. 02/2016 Echo: EF 55-60%, mod to sev MR directed posteriorly--felt to be dynamic -worse with volume overload; c. 07/2017 Echo [facility]): EF >55%. Mod MR.  . Myocardial infarct ([place]) 05/2013  . Peripheral vascular disease ([place])   . Sickle cell trait ([place])   . Thyroid nodule    [place] 06/2017 due to f/u US in 1 year    Social History   Socioeconomic History  . Marital status: Single    Spouse name: Not on file  . Number of children: 2  . Years of education: Not on file  . Highest education level: Not on file  Occupational History  . Not on file  Social Needs  . Financial resource strain: Not on file  . Food insecurity:    Worry: Not on file    Inability: Not on file  . Transportation needs:    Medical: Not on file    Non-medical: Not on file  Tobacco Use  . Smoking status: Former Smoker    Packs/day: 1.00    Years: 25.00    Pack years: 25.00    Last attempt to quit: 06/09/2010    Years since quitting: 8.1  . Smokeless tobacco: Never Used  Substance and Sexual Activity  . Alcohol use: No  . Drug use: No  . Sexual activity: Never  Lifestyle  . Physical activity:    Days per week: Not on file    Minutes per session: Not on file  . Stress: Not on file  Relationships  . Social connections:    Talks on phone: Not on file    Gets together: Not on file    Attends religious service: Not on file    Active member of club or organization: Not on file      Attends meetings of clubs or organizations: Not on file    Relationship status: Not on file  . Intimate partner violence:  Fear of current or ex partner: Not on file    Emotionally abused: Not on file    Physically abused: Not on file    Forced sexual activity: Not on file  Other Topics Concern  . Not on file  Social History Narrative   On disability.    Lives with son Brenton Grills   2 children (has son and daughter)      Son drives her since her vision has decreased   Past Surgical History:  Procedure Laterality Date  . A/V FISTULAGRAM Left 11/10/2017   Procedure: A/V FISTULAGRAM;  Surgeon: Katha Cabal, MD;  Location: [facility];  Service: Cardiovascular;  Laterality: Left;  . ABDOMINAL HYSTERECTOMY     2000  . CARDIAC CATHETERIZATION    . CARDIAC CATHETERIZATION N/A 05/02/2015   Procedure: Left Heart Cath and Coronary Angiography;  Surgeon: [name], MD;  Location: [facility];  Service: Cardiovascular;  Laterality: N/A;  . COLONOSCOPY WITH PROPOFOL N/A 07/08/2016   Procedure: COLONOSCOPY WITH PROPOFOL;  Surgeon: [name], MD;  Location: [facility] ENDOSCOPY;  Service: Endoscopy;  Laterality: N/A;  . COLONOSCOPY WITH PROPOFOL N/A 08/20/2017   Procedure: COLONOSCOPY WITH PROPOFOL;  Surgeon: [name], MD;  Location: [facility]COPY;  Service: Gastroenterology;  Laterality: N/A;  . colonscopy    . CORONARY ANGIOPLASTY  05/28/2014   stent placement to the mid RCA  . DILATION AND CURETTAGE OF UTERUS     several in the early 80's  . ESOPHAGOGASTRODUODENOSCOPY     2012  . ESOPHAGOGASTRODUODENOSCOPY (EGD) WITH PROPOFOL N/A 03/11/2018   Procedure: ESOPHAGOGASTRODUODENOSCOPY (EGD) WITH PROPOFOL;  Surgeon: [name], MD;  Location: [facility] ENDOSCOPY;  Service: Gastroenterology;  Laterality: N/A;  . EYE SURGERY     bilateral laser 2012  . EYE SURGERY     right  . EYE SURGERY     x4 both eyes  . GAS INSERTION  09/30/2011   Procedure: INSERTION OF GAS;   Surgeon: [name], MD;  Location: [facility];  Service: Ophthalmology;  Laterality: Right;  [id number]  . GAS/FLUID EXCHANGE  09/30/2011   Procedure: GAS/FLUID EXCHANGE;  Surgeon: [name], MD;  Location: [facility];  Service: Ophthalmology;  Laterality: Right;  . LEFT HEART CATHETERIZATION WITH CORONARY ANGIOGRAM N/A 05/28/2013   Procedure: LEFT HEART CATHETERIZATION WITH CORONARY ANGIOGRAM;  Surgeon: [name], MD;  Location: [facility] CATH LAB;  Service: Cardiovascular;  Laterality: N/A;  . PARS PLANA VITRECTOMY  04/22/2011   Procedure: PARS PLANA VITRECTOMY WITH 25 GAUGE;  Surgeon: [name], MD;  Location: [place];  Service: Ophthalmology;  Laterality: Left;  membrane peel, endolaser, gas fluid exchange, silicone oil, repair of complex traction retinal detachment  . PARS PLANA VITRECTOMY  09/30/2011   Procedure: PARS PLANA VITRECTOMY WITH 25 GAUGE;  Surgeon: [name], MD;  Location: [place];  Service: Ophthalmology;  Laterality: Right;  Endolaser; Repair of Complex Traction Retinal Detachment  . PARS PLANA VITRECTOMY  02/24/2012   Procedure: PARS PLANA VITRECTOMY WITH 25 GAUGE;  Surgeon: [name], MD;  Location: [place];  Service: Ophthalmology;  Laterality: Left;  . PTCA    . SILICON OIL REMOVAL  9/56/2130   Procedure: SILICON OIL REMOVAL;  Surgeon: [name], MD;  Location: [place];  Service: Ophthalmology;  Laterality: Left;  . THROMBECTOMY / ARTERIOVENOUS GRAFT REVISION    . TUBAL LIGATION     1979   Family History  Problem Relation Age of Onset  .  Stroke Mother   . Heart attack Mother   . Heart disease Mother   . Colon cancer Father   . Colon cancer Sister   . Heart attack Brother   . Stroke Brother   . Diabetes Brother   . Breast cancer Sister   . Diabetes Brother   . Diabetes Daughter   . Renal Disease Daughter        on HD  . Anesthesia problems Neg Hx   . Hypotension Neg Hx   . Malignant hyperthermia Neg Hx   . Pseudochol deficiency Neg Hx     Allergies  Allergen Reactions  . Hydrocodone Hives  . Metformin Diarrhea and Other (See Comments)    Other reaction(s): Distress (finding)  . Lisinopril     Unknown reaction       Assessment & Plan:   Patient presents for a 59-month HDA follow-up. The patient underwent a duplex ultrasound of the AV access which was notable for a patent fistula without any significant hemodynamic stenosis.  There is an area located in the distal graft with an aneurysm with internal flow measuring 1.5cm x 1.1cm x 0.47cm.  Total flow volume is 1660.  There is antegrade biphasic flow in the left distal radial artery.  The patient denies any issues with hemodialysis such as cannulation problems, increased bleeding, decrease in doppler flow or recirculation. The patient also denies any fistula skin breakdown, pain, edema, pallor or ulceration of the arm / hand.  Patient denies any uremic symptoms.  Patient denies any fever, nausea vomiting.   1. ESRD on hemodialysis ([place]) - Stable Studies reviewed with patient. The patient is doing well and currently has adequate dialysis access. Duplex ultrasound of the AV access shows a patent access with no evidence of hemodynamically significant strictures or stenosis.  The patient should continue to have duplex ultrasounds of the dialysis access six months. The patient was instructed to call the office in the interim if any issues with dialysis access / doppler flow, pain, edema, pallor, fistula skin breakdown or ulceration of the arm / hand occur. The patient expressed their understanding.  - VAS US DUPLEX DIALYSIS ACCESS (AVF,AVG); Future  2. Type 2 diabetes mellitus with chronic kidney disease on chronic dialysis, with long-term current use of insulin (HCC) - Stable Encouraged good control as its slows the progression of atherosclerotic disease  3. Complication of vascular access for dialysis, sequela - Stable As above  - VAS US DUPLEX DIALYSIS ACCESS (AVF,AVG);  Future  Current Outpatient Medications on File Prior to Visit  Medication Sig Dispense Refill  . albuterol (PROVENTIL) (2.5 MG/3ML) 0.083% nebulizer solution Take 2.5 mg by nebulization every 6 (six) hours as needed for wheezing or shortness of breath.    Marland Kitchen albuterol (VENTOLIN HFA) 108 (90 Base) MCG/ACT inhaler Inhale into the lungs every 6 (six) hours as needed for wheezing or shortness of breath.    Marland Kitchen amLODipine (NORVASC) 10 MG tablet Take 10 mg by mouth daily.     Marland Kitchen aspirin EC 81 MG tablet Take 81 mg by mouth at bedtime.     Marland Kitchen atorvastatin (LIPITOR) 40 MG tablet Take 1 tablet (40 mg total) by mouth daily. At night 90 tablet 3  . AURYXIA 1 GM 210 MG(Fe) tablet Take 420-630 mg by mouth 3 (three) times daily with meals. Take 630 mg with each meal and 420 mg with each snack  12  . carvedilol (COREG) 12.5 MG tablet Take 1.5 tablets (18.75 mg) by mouth twice  daily 270 tablet 3  . cinacalcet (SENSIPAR) 60 MG tablet Take 60 mg by mouth daily.    . cloNIDine (CATAPRES) 0.1 MG tablet Take 0.1 mg by mouth 2 (two) times daily.    Marland Kitchen losartan (COZAAR) 50 MG tablet Take by mouth.    . multivitamin (RENA-VIT) TABS tablet Take 1 tablet by mouth daily.    . nitroGLYCERIN (NITROSTAT) 0.4 MG SL tablet Place 1 tablet (0.4 mg total) under the tongue every 5 (five) minutes as needed for chest pain. 25 tablet 3  . ondansetron (ZOFRAN) 4 MG tablet Take 1 tablet (4 mg total) by mouth every 8 (eight) hours as needed for nausea or vomiting. 30 tablet 2  . pregabalin (LYRICA) 75 MG capsule Take 1 capsule (75 mg total) by mouth daily. 90 capsule 1  . umeclidinium-vilanterol (ANORO ELLIPTA) 62.5-25 MCG/INH AEPB Inhale 1 puff into the lungs daily. 1 each 0  . furosemide (LASIX) 80 MG tablet Take 80 mg by mouth See admin instructions. Take 80 mg by mouth once daily on Sun, Tue, Thur, and Sat (non dialysis days)    . isosorbide mononitrate (IMDUR) 60 MG 24 hr tablet Take 1 tablet (60 mg total) by mouth daily. 30 tablet 0  .  meloxicam (MOBIC) 7.5 MG tablet meloxicam 7.5 mg tablet    . oxyCODONE-acetaminophen (PERCOCET/ROXICET) 5-325 MG tablet Take 1 tablet by mouth 2 (two) times daily as needed for pain.   0   Current Facility-Administered Medications on File Prior to Visit  Medication Dose Route Frequency Provider Last Rate Last Dose  . betamethasone acetate-betamethasone sodium phosphate (CELESTONE) injection 3 mg  3 mg Intramuscular Once Edrick Kins, DPM       There are no Patient Instructions on file for this visit. No follow-ups on file.  KIMBERLY A STEGMAYER, PA-C

## 2018-07-28 DIAGNOSIS — D509 Iron deficiency anemia, unspecified: Secondary | ICD-10-CM | POA: Diagnosis not present

## 2018-07-28 DIAGNOSIS — D631 Anemia in chronic kidney disease: Secondary | ICD-10-CM | POA: Diagnosis not present

## 2018-07-28 DIAGNOSIS — N2581 Secondary hyperparathyroidism of renal origin: Secondary | ICD-10-CM | POA: Diagnosis not present

## 2018-07-28 DIAGNOSIS — Z992 Dependence on renal dialysis: Secondary | ICD-10-CM | POA: Diagnosis not present

## 2018-07-28 DIAGNOSIS — N186 End stage renal disease: Secondary | ICD-10-CM | POA: Diagnosis not present

## 2018-07-29 DIAGNOSIS — M5136 Other intervertebral disc degeneration, lumbar region: Secondary | ICD-10-CM | POA: Diagnosis not present

## 2018-07-29 DIAGNOSIS — M5416 Radiculopathy, lumbar region: Secondary | ICD-10-CM | POA: Diagnosis not present

## 2018-07-30 DIAGNOSIS — N186 End stage renal disease: Secondary | ICD-10-CM | POA: Diagnosis not present

## 2018-07-30 DIAGNOSIS — D509 Iron deficiency anemia, unspecified: Secondary | ICD-10-CM | POA: Diagnosis not present

## 2018-07-30 DIAGNOSIS — Z992 Dependence on renal dialysis: Secondary | ICD-10-CM | POA: Diagnosis not present

## 2018-07-30 DIAGNOSIS — D631 Anemia in chronic kidney disease: Secondary | ICD-10-CM | POA: Diagnosis not present

## 2018-07-30 DIAGNOSIS — N2581 Secondary hyperparathyroidism of renal origin: Secondary | ICD-10-CM | POA: Diagnosis not present

## 2018-07-30 IMAGING — MR MR LUMBAR SPINE W/O CM
5 series · 33 of 48 positions shown · non-contrast
Comparison: MRI lumbar spine 01/07/2017.

CLINICAL DATA: Low back pain radiating into the right leg with
numbness and tingling. Intermittent difficulty moving the right leg.

EXAM:
MRI LUMBAR SPINE WITHOUT CONTRAST
TECHNIQUE: Multiplanar, multisequence MR imaging of the lumbar spine was
performed. No intravenous contrast was administered.

[Series 2: T2 · sagittal · 4.0mm · 0.81mm/px · 5 of 15 slices shown (1 of 2)]
[im 1/15]
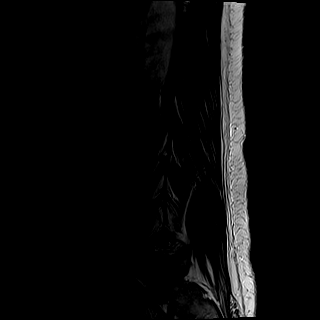
[im 4/15]
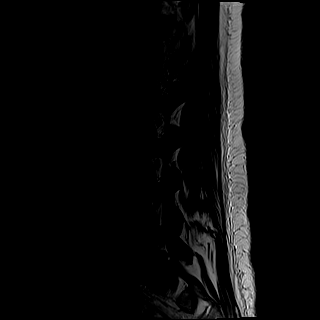
[im 8/15]
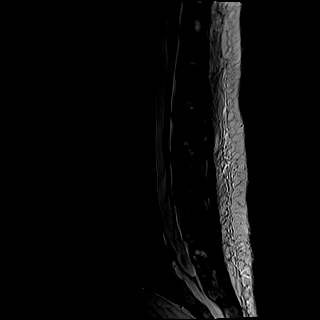
[im 11/15]
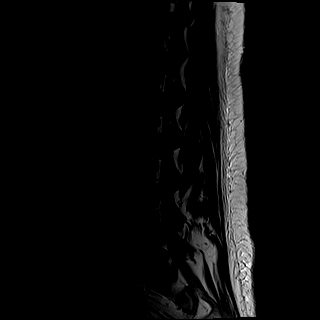
[im 15/15]
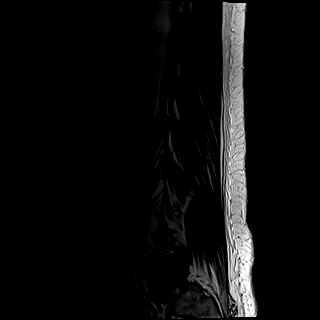

[Series 3: T1 · sagittal · 4.0mm · 0.81mm/px · 5 of 15 slices shown (1 of 2)]
[im 1/15]
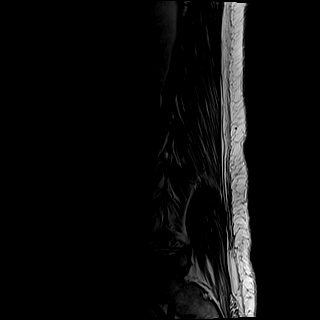
[im 4/15]
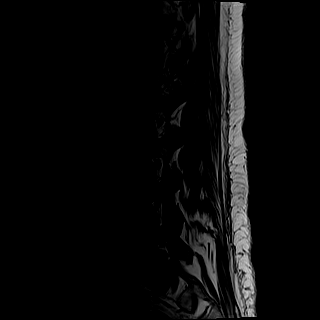
[im 8/15]
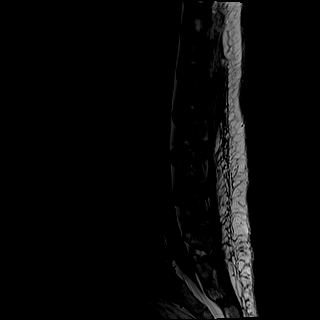
[im 11/15]
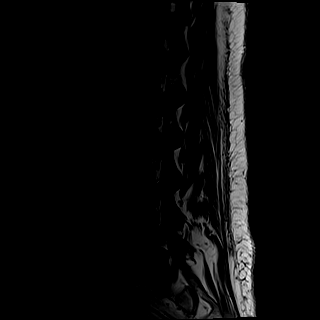
[im 15/15]
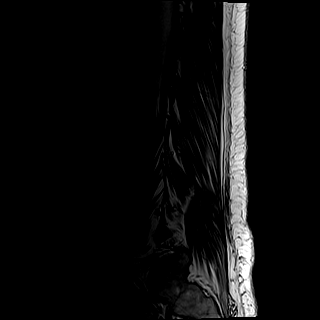

[Series 4: STIR · sagittal · 4.0mm · 0.81mm/px · 3 of 15 slices shown]
[im 1/15]
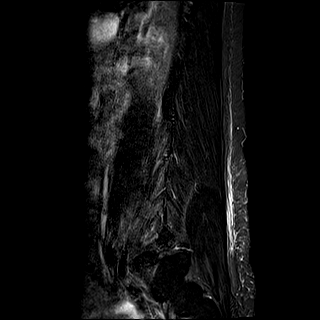
[im 3/15]
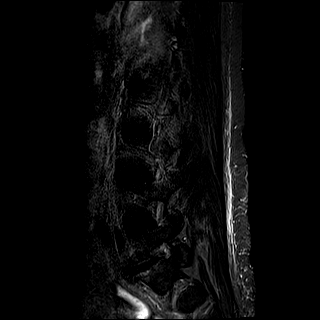
[im 6/15]
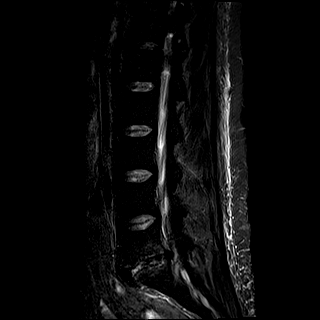

[Series 5: T2 · axial · 4.0mm · 0.78mm/px · z∈[-59,+163]mm · 10 of 42 slices shown (2 of 2)]
[im 3/42]
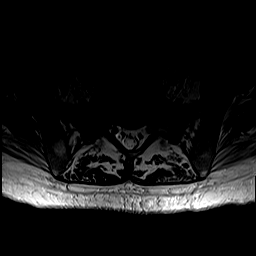
[im 6/42]
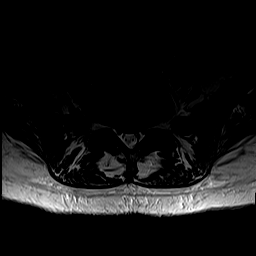
[im 9/42]
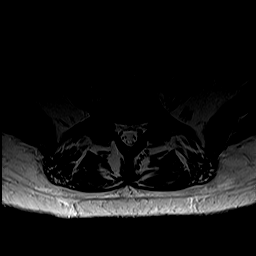
[im 14/42]
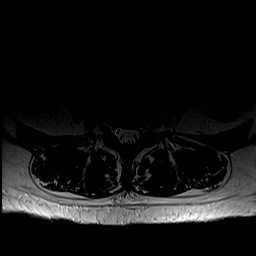
[im 20/42]
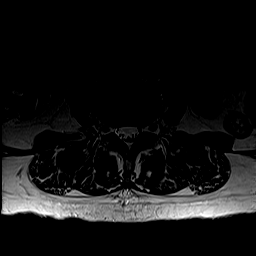
[im 22/42]
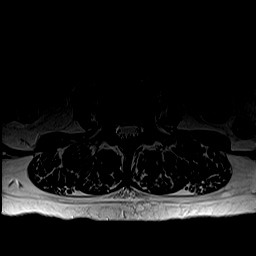
[im 25/42]
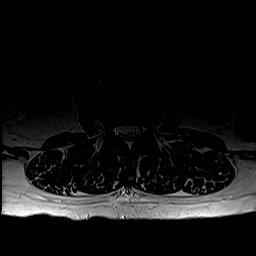
[im 31/42]
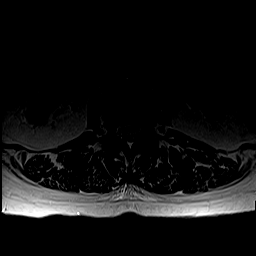
[im 36/42]
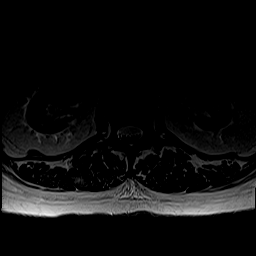
[im 42/42]
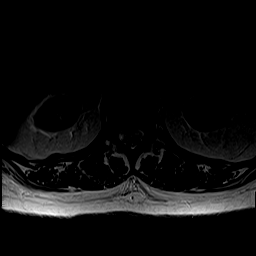

[Series 6: T1 · axial · 4.0mm · 0.39mm/px · z∈[-59,+163]mm · 10 of 42 slices shown (2 of 2)]
[im 3/42]
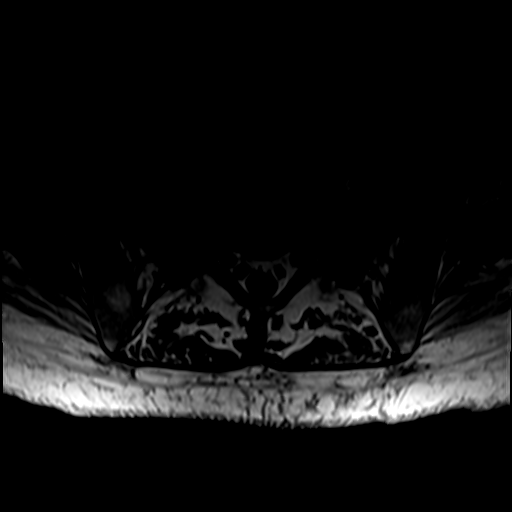
[im 6/42]
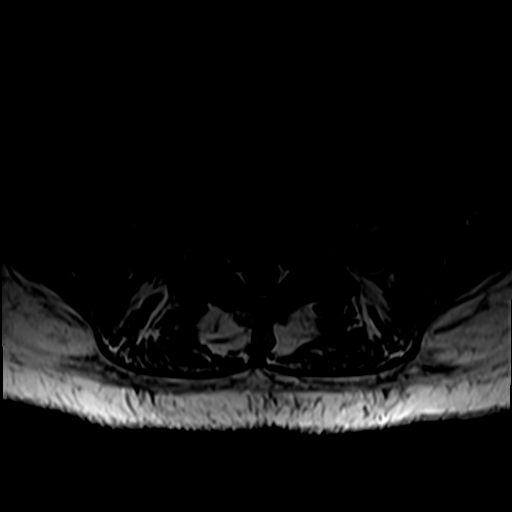
[im 9/42]
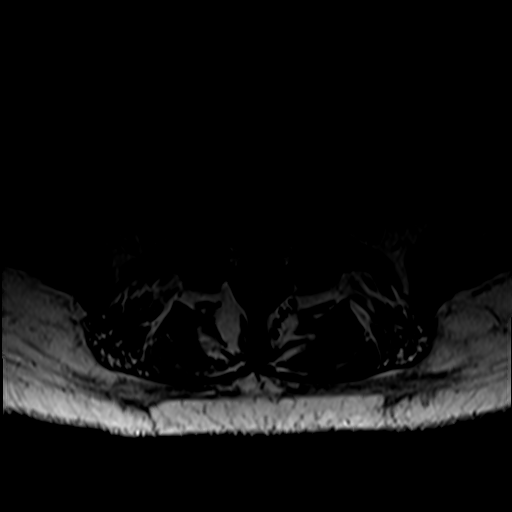
[im 14/42]
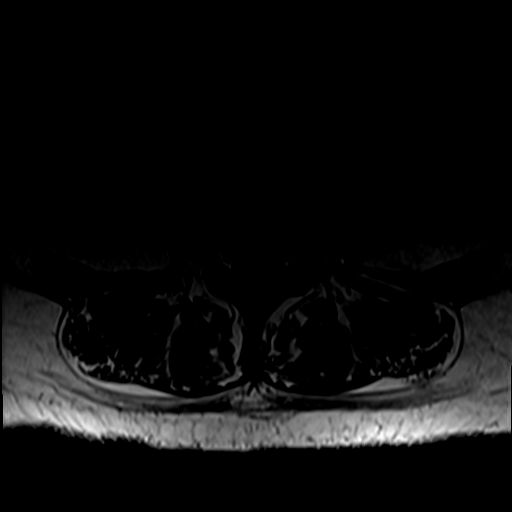
[im 20/42]
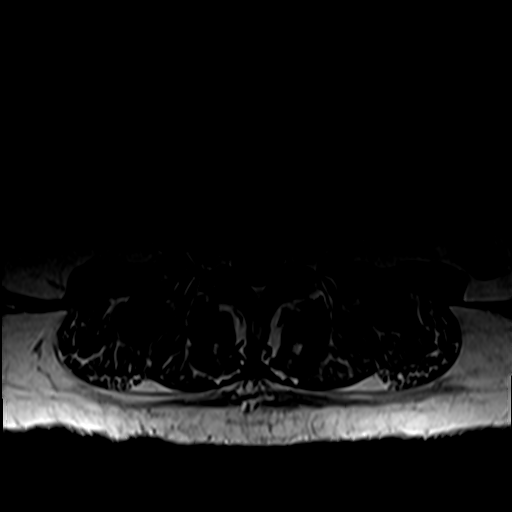
[im 22/42]
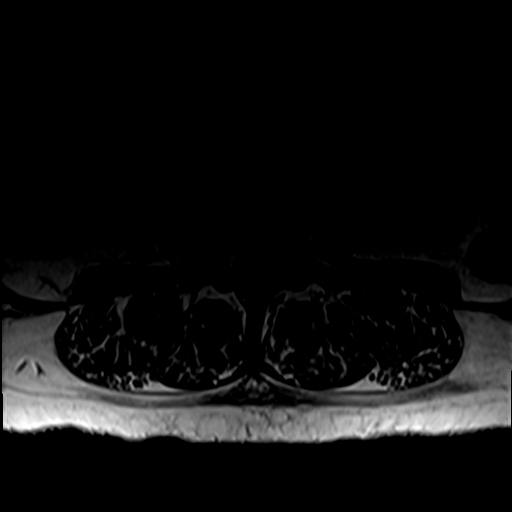
[im 25/42]
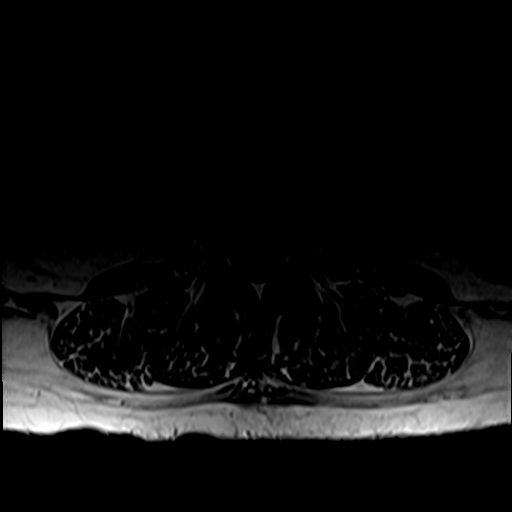
[im 31/42]
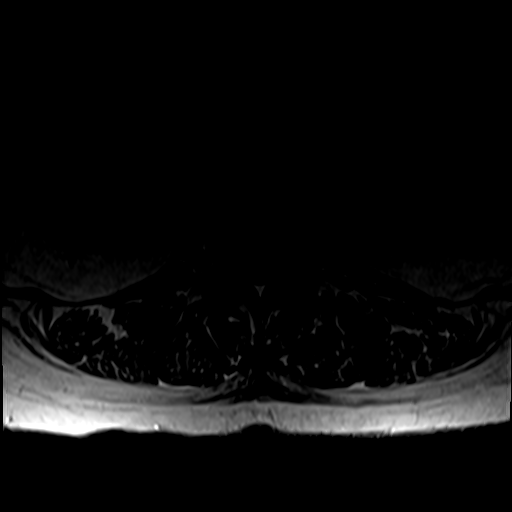
[im 36/42]
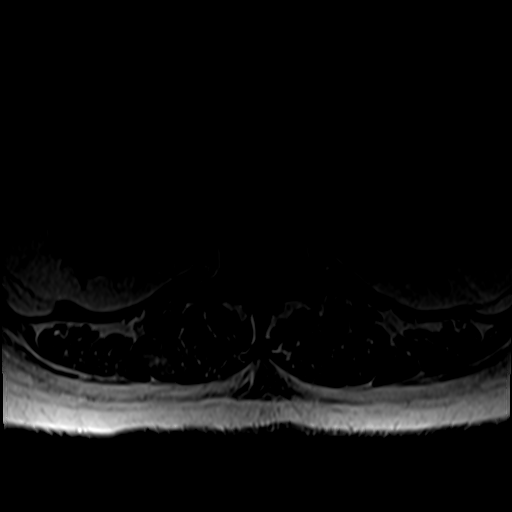
[im 42/42]
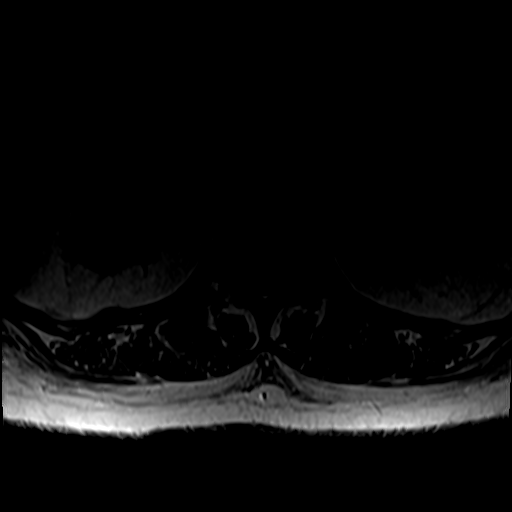

[33 of 48 positions shown; findings below may reference images not displayed]

FINDINGS: Segmentation:  Standard.

Alignment:  Maintained.

Vertebrae: Degenerative endplate signal change L5-S1 is again seen.
A Schmorl's node in the superior endplate of T12 has progressed
since the prior exam. No fracture.

Conus medullaris and cauda equina: Conus extends to the T12-L1
level. Conus and cauda equina appear normal.

Paraspinal and other soft tissues: Kidneys are atrophic with a few
small cysts identified.

Disc levels:

T11-12 is imaged in the sagittal plane only. There is a very shallow
disc bulge without stenosis.

T12-L1: Negative.

L1-2: Negative.

L2-3: Negative.

L3-4: Mild facet degenerative disease is stable in appearance. No
bulge or protrusion. The central canal and foramina are open.

L4-5: Minimal disc bulge, mild facet degenerative change and
ligamentum flavum thickening are stable in appearance. The central
canal and foramina are open.

L5-S1: Loss of disc space height and broad-based disc bulge are
again seen and cause mild narrowing in the subarticular recesses.
Disc protruding within and beyond the foramina could impact either
L5 root. Extraforaminal disc is more prominent on the right.. The
appearance is unchanged.
IMPRESSION: Schmorl's node in the superior endplate of T12 is increased in size
since the prior examination there is new surrounding marrow edema
present. The appearance of the lumbar spine is otherwise unchanged.

Broad-based disc bulge at L5-S1 causes mild bilateral subarticular
recess narrowing. Disc protruding within and beyond the foramina
bilaterally is more prominent on the right and could impact either
L5 root.

## 2018-08-02 DIAGNOSIS — D509 Iron deficiency anemia, unspecified: Secondary | ICD-10-CM | POA: Diagnosis not present

## 2018-08-02 DIAGNOSIS — D631 Anemia in chronic kidney disease: Secondary | ICD-10-CM | POA: Diagnosis not present

## 2018-08-02 DIAGNOSIS — N186 End stage renal disease: Secondary | ICD-10-CM | POA: Diagnosis not present

## 2018-08-02 DIAGNOSIS — Z992 Dependence on renal dialysis: Secondary | ICD-10-CM | POA: Diagnosis not present

## 2018-08-02 DIAGNOSIS — N2581 Secondary hyperparathyroidism of renal origin: Secondary | ICD-10-CM | POA: Diagnosis not present

## 2018-08-04 DIAGNOSIS — D509 Iron deficiency anemia, unspecified: Secondary | ICD-10-CM | POA: Diagnosis not present

## 2018-08-04 DIAGNOSIS — Z992 Dependence on renal dialysis: Secondary | ICD-10-CM | POA: Diagnosis not present

## 2018-08-04 DIAGNOSIS — N186 End stage renal disease: Secondary | ICD-10-CM | POA: Diagnosis not present

## 2018-08-04 DIAGNOSIS — N2581 Secondary hyperparathyroidism of renal origin: Secondary | ICD-10-CM | POA: Diagnosis not present

## 2018-08-04 DIAGNOSIS — D631 Anemia in chronic kidney disease: Secondary | ICD-10-CM | POA: Diagnosis not present

## 2018-08-06 ENCOUNTER — Ambulatory Visit: Payer: Medicare Other | Attending: Otolaryngology

## 2018-08-06 DIAGNOSIS — D509 Iron deficiency anemia, unspecified: Secondary | ICD-10-CM | POA: Diagnosis not present

## 2018-08-06 DIAGNOSIS — N186 End stage renal disease: Secondary | ICD-10-CM | POA: Diagnosis not present

## 2018-08-06 DIAGNOSIS — Z992 Dependence on renal dialysis: Secondary | ICD-10-CM | POA: Diagnosis not present

## 2018-08-06 DIAGNOSIS — D631 Anemia in chronic kidney disease: Secondary | ICD-10-CM | POA: Diagnosis not present

## 2018-08-06 DIAGNOSIS — N2581 Secondary hyperparathyroidism of renal origin: Secondary | ICD-10-CM | POA: Diagnosis not present

## 2018-08-07 DIAGNOSIS — Z992 Dependence on renal dialysis: Secondary | ICD-10-CM | POA: Diagnosis not present

## 2018-08-07 DIAGNOSIS — N186 End stage renal disease: Secondary | ICD-10-CM | POA: Diagnosis not present

## 2018-08-08 DIAGNOSIS — Z992 Dependence on renal dialysis: Secondary | ICD-10-CM | POA: Diagnosis not present

## 2018-08-08 DIAGNOSIS — N2581 Secondary hyperparathyroidism of renal origin: Secondary | ICD-10-CM | POA: Diagnosis not present

## 2018-08-08 DIAGNOSIS — N186 End stage renal disease: Secondary | ICD-10-CM | POA: Diagnosis not present

## 2018-08-08 DIAGNOSIS — D631 Anemia in chronic kidney disease: Secondary | ICD-10-CM | POA: Diagnosis not present

## 2018-08-08 DIAGNOSIS — D509 Iron deficiency anemia, unspecified: Secondary | ICD-10-CM | POA: Diagnosis not present

## 2018-08-09 DIAGNOSIS — N2581 Secondary hyperparathyroidism of renal origin: Secondary | ICD-10-CM | POA: Diagnosis not present

## 2018-08-09 DIAGNOSIS — D631 Anemia in chronic kidney disease: Secondary | ICD-10-CM | POA: Diagnosis not present

## 2018-08-09 DIAGNOSIS — N186 End stage renal disease: Secondary | ICD-10-CM | POA: Diagnosis not present

## 2018-08-09 DIAGNOSIS — D509 Iron deficiency anemia, unspecified: Secondary | ICD-10-CM | POA: Diagnosis not present

## 2018-08-09 DIAGNOSIS — Z992 Dependence on renal dialysis: Secondary | ICD-10-CM | POA: Diagnosis not present

## 2018-08-11 DIAGNOSIS — D509 Iron deficiency anemia, unspecified: Secondary | ICD-10-CM | POA: Diagnosis not present

## 2018-08-11 DIAGNOSIS — N186 End stage renal disease: Secondary | ICD-10-CM | POA: Diagnosis not present

## 2018-08-11 DIAGNOSIS — N2581 Secondary hyperparathyroidism of renal origin: Secondary | ICD-10-CM | POA: Diagnosis not present

## 2018-08-11 DIAGNOSIS — D631 Anemia in chronic kidney disease: Secondary | ICD-10-CM | POA: Diagnosis not present

## 2018-08-11 DIAGNOSIS — Z992 Dependence on renal dialysis: Secondary | ICD-10-CM | POA: Diagnosis not present

## 2018-08-13 DIAGNOSIS — N186 End stage renal disease: Secondary | ICD-10-CM | POA: Diagnosis not present

## 2018-08-13 DIAGNOSIS — D631 Anemia in chronic kidney disease: Secondary | ICD-10-CM | POA: Diagnosis not present

## 2018-08-13 DIAGNOSIS — Z992 Dependence on renal dialysis: Secondary | ICD-10-CM | POA: Diagnosis not present

## 2018-08-13 DIAGNOSIS — N2581 Secondary hyperparathyroidism of renal origin: Secondary | ICD-10-CM | POA: Diagnosis not present

## 2018-08-13 DIAGNOSIS — D509 Iron deficiency anemia, unspecified: Secondary | ICD-10-CM | POA: Diagnosis not present

## 2018-08-16 DIAGNOSIS — N2581 Secondary hyperparathyroidism of renal origin: Secondary | ICD-10-CM | POA: Diagnosis not present

## 2018-08-16 DIAGNOSIS — D509 Iron deficiency anemia, unspecified: Secondary | ICD-10-CM | POA: Diagnosis not present

## 2018-08-16 DIAGNOSIS — N186 End stage renal disease: Secondary | ICD-10-CM | POA: Diagnosis not present

## 2018-08-16 DIAGNOSIS — D631 Anemia in chronic kidney disease: Secondary | ICD-10-CM | POA: Diagnosis not present

## 2018-08-16 DIAGNOSIS — Z992 Dependence on renal dialysis: Secondary | ICD-10-CM | POA: Diagnosis not present

## 2018-08-17 DIAGNOSIS — R251 Tremor, unspecified: Secondary | ICD-10-CM | POA: Diagnosis not present

## 2018-08-17 DIAGNOSIS — R413 Other amnesia: Secondary | ICD-10-CM | POA: Diagnosis not present

## 2018-08-18 DIAGNOSIS — D509 Iron deficiency anemia, unspecified: Secondary | ICD-10-CM | POA: Diagnosis not present

## 2018-08-18 DIAGNOSIS — N2581 Secondary hyperparathyroidism of renal origin: Secondary | ICD-10-CM | POA: Diagnosis not present

## 2018-08-18 DIAGNOSIS — N186 End stage renal disease: Secondary | ICD-10-CM | POA: Diagnosis not present

## 2018-08-18 DIAGNOSIS — Z992 Dependence on renal dialysis: Secondary | ICD-10-CM | POA: Diagnosis not present

## 2018-08-18 DIAGNOSIS — D631 Anemia in chronic kidney disease: Secondary | ICD-10-CM | POA: Diagnosis not present

## 2018-08-20 DIAGNOSIS — N2581 Secondary hyperparathyroidism of renal origin: Secondary | ICD-10-CM | POA: Diagnosis not present

## 2018-08-20 DIAGNOSIS — N186 End stage renal disease: Secondary | ICD-10-CM | POA: Diagnosis not present

## 2018-08-20 DIAGNOSIS — D509 Iron deficiency anemia, unspecified: Secondary | ICD-10-CM | POA: Diagnosis not present

## 2018-08-20 DIAGNOSIS — Z992 Dependence on renal dialysis: Secondary | ICD-10-CM | POA: Diagnosis not present

## 2018-08-20 DIAGNOSIS — D631 Anemia in chronic kidney disease: Secondary | ICD-10-CM | POA: Diagnosis not present

## 2018-08-23 DIAGNOSIS — Z992 Dependence on renal dialysis: Secondary | ICD-10-CM | POA: Diagnosis not present

## 2018-08-23 DIAGNOSIS — D631 Anemia in chronic kidney disease: Secondary | ICD-10-CM | POA: Diagnosis not present

## 2018-08-23 DIAGNOSIS — N186 End stage renal disease: Secondary | ICD-10-CM | POA: Diagnosis not present

## 2018-08-23 DIAGNOSIS — D509 Iron deficiency anemia, unspecified: Secondary | ICD-10-CM | POA: Diagnosis not present

## 2018-08-23 DIAGNOSIS — N2581 Secondary hyperparathyroidism of renal origin: Secondary | ICD-10-CM | POA: Diagnosis not present

## 2018-08-25 DIAGNOSIS — Z7682 Awaiting organ transplant status: Principal | ICD-10-CM

## 2018-08-25 DIAGNOSIS — N186 End stage renal disease: Principal | ICD-10-CM

## 2018-08-25 DIAGNOSIS — Z992 Dependence on renal dialysis: Principal | ICD-10-CM

## 2018-08-25 DIAGNOSIS — Z7289 Other problems related to lifestyle: Principal | ICD-10-CM

## 2018-08-25 DIAGNOSIS — Z01818 Encounter for other preprocedural examination: Principal | ICD-10-CM

## 2018-08-25 DIAGNOSIS — Z01810 Encounter for preprocedural cardiovascular examination: Principal | ICD-10-CM

## 2018-08-25 DIAGNOSIS — D631 Anemia in chronic kidney disease: Secondary | ICD-10-CM | POA: Diagnosis not present

## 2018-08-25 DIAGNOSIS — N2581 Secondary hyperparathyroidism of renal origin: Secondary | ICD-10-CM | POA: Diagnosis not present

## 2018-08-25 DIAGNOSIS — D509 Iron deficiency anemia, unspecified: Secondary | ICD-10-CM | POA: Diagnosis not present

## 2018-08-26 ENCOUNTER — Ambulatory Visit (INDEPENDENT_AMBULATORY_CARE_PROVIDER_SITE_OTHER): Payer: Medicare Other | Admitting: Podiatry

## 2018-08-26 ENCOUNTER — Encounter: Payer: Self-pay | Admitting: Podiatry

## 2018-08-26 ENCOUNTER — Other Ambulatory Visit: Payer: Self-pay

## 2018-08-26 DIAGNOSIS — E1140 Type 2 diabetes mellitus with diabetic neuropathy, unspecified: Secondary | ICD-10-CM

## 2018-08-26 DIAGNOSIS — M79609 Pain in unspecified limb: Principal | ICD-10-CM

## 2018-08-26 DIAGNOSIS — B351 Tinea unguium: Secondary | ICD-10-CM

## 2018-08-26 DIAGNOSIS — L608 Other nail disorders: Secondary | ICD-10-CM

## 2018-08-26 DIAGNOSIS — M79676 Pain in unspecified toe(s): Secondary | ICD-10-CM | POA: Diagnosis not present

## 2018-08-26 NOTE — Progress Notes (Signed)
Complaint:  Visit Type: Patient returns to my office for continued preventative foot care services. Complaint: Patient states" my nails have grown long and thick and become painful to walk and wear shoes" Patient has been diagnosed with DM with no foot complications. The patient presents for preventative foot care services. No changes to ROS  Podiatric Exam: Vascular: dorsalis pedis and posterior tibial pulses are palpable bilateral. Capillary return is immediate. Temperature gradient is WNL. Skin turgor WNL  Sensorium: Normal Semmes Weinstein monofilament test. Normal tactile sensation bilaterally. Nail Exam: Pt has thick disfigured discolored nails with subungual debris noted bilateral entire nail hallux through fifth toenails.  Pincer nails  B/L Ulcer Exam: There is no evidence of ulcer or pre-ulcerative changes or infection. Orthopedic Exam: Muscle tone and strength are WNL. No limitations in general ROM. No crepitus or effusions noted. Foot type and digits show no abnormalities. HAV  B/L Skin: No Porokeratosis. No infection or ulcers.   Diagnosis:  Onychomycosis, , Pain in right toe, pain in left toes  Treatment & Plan Procedures and Treatment: Consent by patient was obtained for treatment procedures. The patient understood the discussion of treatment and procedures well. All questions were answered thoroughly reviewed. Debridement of mycotic and hypertrophic toenails, 1 through 5 bilateral and clearing of subungual debris. No ulceration, no infection noted.  Return Visit-Office Procedure: Patient instructed to return to the office for a follow up visit 3 months for continued evaluation and treatment.    Gardiner Barefoot DPM

## 2018-08-27 DIAGNOSIS — N186 End stage renal disease: Secondary | ICD-10-CM | POA: Diagnosis not present

## 2018-08-27 DIAGNOSIS — Z992 Dependence on renal dialysis: Secondary | ICD-10-CM | POA: Diagnosis not present

## 2018-08-27 DIAGNOSIS — N2581 Secondary hyperparathyroidism of renal origin: Secondary | ICD-10-CM | POA: Diagnosis not present

## 2018-08-27 DIAGNOSIS — D631 Anemia in chronic kidney disease: Secondary | ICD-10-CM | POA: Diagnosis not present

## 2018-08-27 DIAGNOSIS — D509 Iron deficiency anemia, unspecified: Secondary | ICD-10-CM | POA: Diagnosis not present

## 2018-08-30 DIAGNOSIS — N2581 Secondary hyperparathyroidism of renal origin: Secondary | ICD-10-CM | POA: Diagnosis not present

## 2018-08-30 DIAGNOSIS — N186 End stage renal disease: Secondary | ICD-10-CM | POA: Diagnosis not present

## 2018-08-30 DIAGNOSIS — D631 Anemia in chronic kidney disease: Secondary | ICD-10-CM | POA: Diagnosis not present

## 2018-08-30 DIAGNOSIS — D509 Iron deficiency anemia, unspecified: Secondary | ICD-10-CM | POA: Diagnosis not present

## 2018-08-30 DIAGNOSIS — Z992 Dependence on renal dialysis: Secondary | ICD-10-CM | POA: Diagnosis not present

## 2018-09-01 DIAGNOSIS — N2581 Secondary hyperparathyroidism of renal origin: Secondary | ICD-10-CM | POA: Diagnosis not present

## 2018-09-01 DIAGNOSIS — N186 End stage renal disease: Secondary | ICD-10-CM | POA: Diagnosis not present

## 2018-09-01 DIAGNOSIS — D631 Anemia in chronic kidney disease: Secondary | ICD-10-CM | POA: Diagnosis not present

## 2018-09-01 DIAGNOSIS — Z992 Dependence on renal dialysis: Secondary | ICD-10-CM | POA: Diagnosis not present

## 2018-09-01 DIAGNOSIS — D509 Iron deficiency anemia, unspecified: Secondary | ICD-10-CM | POA: Diagnosis not present

## 2018-09-03 DIAGNOSIS — D509 Iron deficiency anemia, unspecified: Secondary | ICD-10-CM | POA: Diagnosis not present

## 2018-09-03 DIAGNOSIS — N186 End stage renal disease: Secondary | ICD-10-CM | POA: Diagnosis not present

## 2018-09-03 DIAGNOSIS — Z992 Dependence on renal dialysis: Secondary | ICD-10-CM | POA: Diagnosis not present

## 2018-09-03 DIAGNOSIS — N2581 Secondary hyperparathyroidism of renal origin: Secondary | ICD-10-CM | POA: Diagnosis not present

## 2018-09-03 DIAGNOSIS — D631 Anemia in chronic kidney disease: Secondary | ICD-10-CM | POA: Diagnosis not present

## 2018-09-06 DIAGNOSIS — N2581 Secondary hyperparathyroidism of renal origin: Secondary | ICD-10-CM | POA: Diagnosis not present

## 2018-09-06 DIAGNOSIS — Z992 Dependence on renal dialysis: Secondary | ICD-10-CM | POA: Diagnosis not present

## 2018-09-06 DIAGNOSIS — D509 Iron deficiency anemia, unspecified: Secondary | ICD-10-CM | POA: Diagnosis not present

## 2018-09-06 DIAGNOSIS — N186 End stage renal disease: Secondary | ICD-10-CM | POA: Diagnosis not present

## 2018-09-06 DIAGNOSIS — D631 Anemia in chronic kidney disease: Secondary | ICD-10-CM | POA: Diagnosis not present

## 2018-09-07 DIAGNOSIS — Z992 Dependence on renal dialysis: Secondary | ICD-10-CM | POA: Diagnosis not present

## 2018-09-07 DIAGNOSIS — N186 End stage renal disease: Secondary | ICD-10-CM | POA: Diagnosis not present

## 2018-09-08 DIAGNOSIS — E8779 Other fluid overload: Secondary | ICD-10-CM | POA: Diagnosis not present

## 2018-09-08 DIAGNOSIS — D631 Anemia in chronic kidney disease: Secondary | ICD-10-CM | POA: Diagnosis not present

## 2018-09-08 DIAGNOSIS — Z992 Dependence on renal dialysis: Secondary | ICD-10-CM | POA: Diagnosis not present

## 2018-09-08 DIAGNOSIS — D509 Iron deficiency anemia, unspecified: Secondary | ICD-10-CM | POA: Diagnosis not present

## 2018-09-08 DIAGNOSIS — N2581 Secondary hyperparathyroidism of renal origin: Secondary | ICD-10-CM | POA: Diagnosis not present

## 2018-09-08 DIAGNOSIS — Z298 Encounter for other specified prophylactic measures: Secondary | ICD-10-CM | POA: Diagnosis not present

## 2018-09-08 DIAGNOSIS — N186 End stage renal disease: Secondary | ICD-10-CM | POA: Diagnosis not present

## 2018-09-10 DIAGNOSIS — Z298 Encounter for other specified prophylactic measures: Secondary | ICD-10-CM | POA: Diagnosis not present

## 2018-09-10 DIAGNOSIS — D509 Iron deficiency anemia, unspecified: Secondary | ICD-10-CM | POA: Diagnosis not present

## 2018-09-10 DIAGNOSIS — N2581 Secondary hyperparathyroidism of renal origin: Secondary | ICD-10-CM | POA: Diagnosis not present

## 2018-09-10 DIAGNOSIS — D631 Anemia in chronic kidney disease: Secondary | ICD-10-CM | POA: Diagnosis not present

## 2018-09-10 DIAGNOSIS — N186 End stage renal disease: Secondary | ICD-10-CM | POA: Diagnosis not present

## 2018-09-10 DIAGNOSIS — Z992 Dependence on renal dialysis: Secondary | ICD-10-CM | POA: Diagnosis not present

## 2018-09-11 DIAGNOSIS — D631 Anemia in chronic kidney disease: Secondary | ICD-10-CM | POA: Diagnosis not present

## 2018-09-11 DIAGNOSIS — N2581 Secondary hyperparathyroidism of renal origin: Secondary | ICD-10-CM | POA: Diagnosis not present

## 2018-09-11 DIAGNOSIS — Z298 Encounter for other specified prophylactic measures: Secondary | ICD-10-CM | POA: Diagnosis not present

## 2018-09-11 DIAGNOSIS — N186 End stage renal disease: Secondary | ICD-10-CM | POA: Diagnosis not present

## 2018-09-11 DIAGNOSIS — D509 Iron deficiency anemia, unspecified: Secondary | ICD-10-CM | POA: Diagnosis not present

## 2018-09-11 DIAGNOSIS — Z992 Dependence on renal dialysis: Secondary | ICD-10-CM | POA: Diagnosis not present

## 2018-09-13 DIAGNOSIS — D631 Anemia in chronic kidney disease: Secondary | ICD-10-CM | POA: Diagnosis not present

## 2018-09-13 DIAGNOSIS — D509 Iron deficiency anemia, unspecified: Secondary | ICD-10-CM | POA: Diagnosis not present

## 2018-09-13 DIAGNOSIS — N186 End stage renal disease: Secondary | ICD-10-CM | POA: Diagnosis not present

## 2018-09-13 DIAGNOSIS — Z298 Encounter for other specified prophylactic measures: Secondary | ICD-10-CM | POA: Diagnosis not present

## 2018-09-13 DIAGNOSIS — N2581 Secondary hyperparathyroidism of renal origin: Secondary | ICD-10-CM | POA: Diagnosis not present

## 2018-09-13 DIAGNOSIS — Z992 Dependence on renal dialysis: Secondary | ICD-10-CM | POA: Diagnosis not present

## 2018-09-15 DIAGNOSIS — Z992 Dependence on renal dialysis: Secondary | ICD-10-CM | POA: Diagnosis not present

## 2018-09-15 DIAGNOSIS — N2581 Secondary hyperparathyroidism of renal origin: Secondary | ICD-10-CM | POA: Diagnosis not present

## 2018-09-15 DIAGNOSIS — D631 Anemia in chronic kidney disease: Secondary | ICD-10-CM | POA: Diagnosis not present

## 2018-09-15 DIAGNOSIS — Z298 Encounter for other specified prophylactic measures: Secondary | ICD-10-CM | POA: Diagnosis not present

## 2018-09-15 DIAGNOSIS — N186 End stage renal disease: Secondary | ICD-10-CM | POA: Diagnosis not present

## 2018-09-15 DIAGNOSIS — D509 Iron deficiency anemia, unspecified: Secondary | ICD-10-CM | POA: Diagnosis not present

## 2018-09-17 DIAGNOSIS — N2581 Secondary hyperparathyroidism of renal origin: Secondary | ICD-10-CM | POA: Diagnosis not present

## 2018-09-17 DIAGNOSIS — Z298 Encounter for other specified prophylactic measures: Secondary | ICD-10-CM | POA: Diagnosis not present

## 2018-09-17 DIAGNOSIS — Z992 Dependence on renal dialysis: Secondary | ICD-10-CM | POA: Diagnosis not present

## 2018-09-17 DIAGNOSIS — D509 Iron deficiency anemia, unspecified: Secondary | ICD-10-CM | POA: Diagnosis not present

## 2018-09-17 DIAGNOSIS — N186 End stage renal disease: Secondary | ICD-10-CM | POA: Diagnosis not present

## 2018-09-17 DIAGNOSIS — D631 Anemia in chronic kidney disease: Secondary | ICD-10-CM | POA: Diagnosis not present

## 2018-09-20 DIAGNOSIS — Z298 Encounter for other specified prophylactic measures: Secondary | ICD-10-CM | POA: Diagnosis not present

## 2018-09-20 DIAGNOSIS — N186 End stage renal disease: Secondary | ICD-10-CM | POA: Diagnosis not present

## 2018-09-20 DIAGNOSIS — Z992 Dependence on renal dialysis: Secondary | ICD-10-CM | POA: Diagnosis not present

## 2018-09-20 DIAGNOSIS — D631 Anemia in chronic kidney disease: Secondary | ICD-10-CM | POA: Diagnosis not present

## 2018-09-20 DIAGNOSIS — N2581 Secondary hyperparathyroidism of renal origin: Secondary | ICD-10-CM | POA: Diagnosis not present

## 2018-09-20 DIAGNOSIS — D509 Iron deficiency anemia, unspecified: Secondary | ICD-10-CM | POA: Diagnosis not present

## 2018-09-22 DIAGNOSIS — N2581 Secondary hyperparathyroidism of renal origin: Secondary | ICD-10-CM | POA: Diagnosis not present

## 2018-09-22 DIAGNOSIS — D509 Iron deficiency anemia, unspecified: Secondary | ICD-10-CM | POA: Diagnosis not present

## 2018-09-22 DIAGNOSIS — Z298 Encounter for other specified prophylactic measures: Secondary | ICD-10-CM | POA: Diagnosis not present

## 2018-09-22 DIAGNOSIS — Z992 Dependence on renal dialysis: Secondary | ICD-10-CM | POA: Diagnosis not present

## 2018-09-22 DIAGNOSIS — N186 End stage renal disease: Secondary | ICD-10-CM | POA: Diagnosis not present

## 2018-09-22 DIAGNOSIS — D631 Anemia in chronic kidney disease: Secondary | ICD-10-CM | POA: Diagnosis not present

## 2018-09-23 ENCOUNTER — Ambulatory Visit (INDEPENDENT_AMBULATORY_CARE_PROVIDER_SITE_OTHER): Payer: Medicare Other | Admitting: Internal Medicine

## 2018-09-23 DIAGNOSIS — Z9989 Dependence on other enabling machines and devices: Secondary | ICD-10-CM

## 2018-09-23 DIAGNOSIS — Z1231 Encounter for screening mammogram for malignant neoplasm of breast: Secondary | ICD-10-CM

## 2018-09-23 DIAGNOSIS — J309 Allergic rhinitis, unspecified: Secondary | ICD-10-CM | POA: Diagnosis not present

## 2018-09-23 DIAGNOSIS — Z992 Dependence on renal dialysis: Secondary | ICD-10-CM | POA: Diagnosis not present

## 2018-09-23 DIAGNOSIS — J449 Chronic obstructive pulmonary disease, unspecified: Secondary | ICD-10-CM

## 2018-09-23 DIAGNOSIS — R251 Tremor, unspecified: Secondary | ICD-10-CM

## 2018-09-23 DIAGNOSIS — G4733 Obstructive sleep apnea (adult) (pediatric): Secondary | ICD-10-CM

## 2018-09-23 DIAGNOSIS — N186 End stage renal disease: Secondary | ICD-10-CM

## 2018-09-23 DIAGNOSIS — I10 Essential (primary) hypertension: Secondary | ICD-10-CM | POA: Diagnosis not present

## 2018-09-23 MED ORDER — UMECLIDINIUM-VILANTEROL 62.5-25 MCG/INH IN AEPB: 1.0000 | INHALATION_SPRAY | Freq: Every day | RESPIRATORY_TRACT | 12 refills | Status: DC

## 2018-09-23 MED ORDER — ALBUTEROL SULFATE HFA 108 (90 BASE) MCG/ACT IN AERS: 1.0000 | INHALATION_SPRAY | Freq: Four times a day (QID) | RESPIRATORY_TRACT | 12 refills | Status: DC | PRN

## 2018-09-23 MED ORDER — ALBUTEROL SULFATE (2.5 MG/3ML) 0.083% IN NEBU: 2.5000 mg | INHALATION_SOLUTION | Freq: Four times a day (QID) | RESPIRATORY_TRACT | 11 refills | Status: DC | PRN

## 2018-09-23 MED ORDER — CETIRIZINE HCL 5 MG PO TABS: 5.0000 mg | ORAL_TABLET | Freq: Every day | ORAL | 3 refills | Status: DC | PRN

## 2018-09-23 NOTE — Progress Notes (Signed)
Telephone Note  I connected with Marcial Pacas  on 09/23/18 at  3:35 PM EDT by telephone and verified that I am speaking with the correct person using two identifiers.  Location patient: home Location provider:work  Persons participating in the virtual visit: patient, provider  I discussed the limitations of evaluation and management by telemedicine and the availability of in person appointments. The patient expressed understanding and agreed to proceed.   HPI: 1. HTN BP variable 112/58 then 157/65 recently vascular appt on norvasc 10 mg qd, clonidine 0.1 bid, losartan 50 mg qd, imdur er 60 mg qd coreg 18.75 mg bid, lasix 80 mg qd  2. OSA not using CPAP due to did not like full face mask 3.likely COPD per pulmonary she would like refill albuterol neb, inhaler, anoro  4. Allergies worse now with pollen with runny nose and eyes itching she will consider reaching out to her eye doctor if eyes continue to itch. She used to take allergy pills and wants to try them again  5. ESRD on HD [facility]Th[facility] renal Dr. Holley Raring she reports she had labs 09/2018 and her calcium was low so she was told to take TUMS she is taking 1-2 x per day  6. Tremor saw neurology 08/17/18 placed on requip 0.25 tid but she does not notice difference appt with neurology 11/2018 for f/u but she will call them before if needed for further instructions   ROS: See pertinent positives and negatives per HPI.  Past Medical History:  Diagnosis Date  . Anemia of chronic disease   . Anginal pain ([place])   . Carotid arterial disease ([place])    a. 02/2013 U/S: 40-59% bilat ICA stenosis.  . Chronic constipation   . Chronic diastolic CHF (congestive heart failure) ([place])    a. 10/2013 Echo [facility]): EF 55-60%, mod conc LVH, mod MR, mildly dil LA, mild Ao sclerosis w/o stenosis; b. 02/2016 Echo: EF 55-60%, no rwma, Gr DD, mild AS, mod to sev MR, mildly dil LA, PASP 59mmHg; c. 07/2017 Echo [facility]): EF >55%, mild to mod LVH, Gr2 DD, Ao scl, Mod MR, mod dil LA, nl RV  fxn.  . Colon polyps   . COPD (chronic obstructive pulmonary disease) ([place])   . Coronary artery disease    a. 05/2013 NSTEMI/PCI: LM 20d, LAD min irregs, LCX small, nl, OM1 nl, RCA dom 89m (2.5x16 Promus DES), PDA1 80p; b. 04/2015 Cath: LM nl, LAD 57m, D1/2 min irregs, LCX 35p/m, OM2/3 min irregs, RCA patent mid stent, RPDA 70ost, RPLB1 30, RPLB2/3 min irregs, EF 55-65%--> Med Rx; c. 06/2017 [facility]): No ischemia/infarct, EF 64%.  . Diabetes mellitus   . Diabetic neuropathy ([place])   . Diabetic retinopathy ([place]) 05/28/2013   Hx bilat retinal detachment, proliferative diab retinopathy and bilat vitreous hemorrhage   . Emphysema   . ESRD on hemodialysis ([place])    a. [facility] in [place], [place] [phone]/Dr. [name], on a MWF schedule.  She started dialysis in Feb 2014.  Etiology of renal failure not known, likely diabetes.  Has a left upper arm AV graft.  . History of bronchitis    Mar 2012  . History of pneumonia    June 2012  . History of tobacco abuse    a. Quit 2012.  Marland Kitchen Hyperlipidemia   . Hypertension   . Moderate to severe mitral insufficiency    a. 10/2013 Echo: EF 55-60%, mod MR; b. 02/2016 Echo: EF 55-60%, mod to sev MR directed posteriorly--felt to be dynamic -  worse with volume overload; c. 07/2017 Echo [facility]): EF >55%. Mod MR.  . Myocardial infarct ([place]) 05/2013  . Peripheral vascular disease ([place])   . Sickle cell trait ([place])   . Thyroid nodule    [place] 06/2017 due to f/u US in 1 year     Past Surgical History:  Procedure Laterality Date  . A/V FISTULAGRAM Left 11/10/2017   Procedure: A/V FISTULAGRAM;  Surgeon: Katha Cabal, MD;  Location: [place] CV LAB;  Service: Cardiovascular;  Laterality: Left;  . ABDOMINAL HYSTERECTOMY     2000  . CARDIAC CATHETERIZATION    . CARDIAC CATHETERIZATION N/A 05/02/2015   Procedure: Left Heart Cath and Coronary Angiography;  Surgeon: [name], MD;  Location: [place] [facility] CV [facility];  Service: Cardiovascular;  Laterality: N/A;  .  COLONOSCOPY WITH PROPOFOL N/A 07/08/2016   Procedure: COLONOSCOPY WITH PROPOFOL;  Surgeon: [name], MD;  Location: [facility] ENDOSCOPY;  Service: Endoscopy;  Laterality: N/A;  . COLONOSCOPY WITH PROPOFOL N/A 08/20/2017   Procedure: COLONOSCOPY WITH PROPOFOL;  Surgeon: [name], MD;  Location: [facility] ENDOSCOPY;  Service: Gastroenterology;  Laterality: N/A;  . colonscopy    . CORONARY ANGIOPLASTY  05/28/2014   stent placement to the mid RCA  . DILATION AND CURETTAGE OF UTERUS     several in the early 80's  . ESOPHAGOGASTRODUODENOSCOPY     2012  . ESOPHAGOGASTRODUODENOSCOPY (EGD) WITH PROPOFOL N/A 03/11/2018   Procedure: ESOPHAGOGASTRODUODENOSCOPY (EGD) WITH PROPOFOL;  Surgeon: [name], MD;  Location: [facility] ENDOSCOPY;  Service: Gastroenterology;  Laterality: N/A;  . EYE SURGERY     bilateral laser 2012  . EYE SURGERY     right  . EYE SURGERY     x4 both eyes  . GAS INSERTION  09/30/2011   Procedure: INSERTION OF GAS;  Surgeon: [name], MD;  Location: [place];  Service: Ophthalmology;  Laterality: Right;  [id number]  . GAS/FLUID EXCHANGE  09/30/2011   Procedure: GAS/FLUID EXCHANGE;  Surgeon: [name], MD;  Location: [place];  Service: Ophthalmology;  Laterality: Right;  . LEFT HEART CATHETERIZATION WITH CORONARY ANGIOGRAM N/A 05/28/2013   Procedure: LEFT HEART CATHETERIZATION WITH CORONARY ANGIOGRAM;  Surgeon: [name], MD;  Location: [facility] CATH LAB;  Service: Cardiovascular;  Laterality: N/A;  . PARS PLANA VITRECTOMY  04/22/2011   Procedure: PARS PLANA VITRECTOMY WITH 25 GAUGE;  Surgeon: [name], MD;  Location: [place];  Service: Ophthalmology;  Laterality: Left;  membrane peel, endolaser, gas fluid exchange, silicone oil, repair of complex traction retinal detachment  . PARS PLANA VITRECTOMY  09/30/2011   Procedure: PARS PLANA VITRECTOMY WITH 25 GAUGE;  Surgeon: [name], MD;  Location: [place];  Service: Ophthalmology;  Laterality: Right;  Endolaser; Repair of Complex  Traction Retinal Detachment  . PARS PLANA VITRECTOMY  02/24/2012   Procedure: PARS PLANA VITRECTOMY WITH 25 GAUGE;  Surgeon: [name], MD;  Location: [place];  Service: Ophthalmology;  Laterality: Left;  . PTCA    . SILICON OIL REMOVAL  1/63/8466   Procedure: SILICON OIL REMOVAL;  Surgeon: [name], MD;  Location: [place];  Service: Ophthalmology;  Laterality: Left;  . THROMBECTOMY / ARTERIOVENOUS GRAFT REVISION    . TUBAL LIGATION     1979    Family History  Problem Relation Age of Onset  . Stroke Mother   . Heart attack Mother   . Heart disease Mother   . Colon cancer Father   . Colon cancer  Sister   . Heart attack Brother   . Stroke Brother   . Diabetes Brother   . Breast cancer Sister   . Diabetes Brother   . Diabetes Daughter   . Renal Disease Daughter        on HD  . Anesthesia problems Neg Hx   . Hypotension Neg Hx   . Malignant hyperthermia Neg Hx   . Pseudochol deficiency Neg Hx     SOCIAL HX: disability lives at home    Current Outpatient Medications:  .  albuterol (PROVENTIL) (2.5 MG/3ML) 0.083% nebulizer solution, Take 3 mLs (2.5 mg total) by nebulization every 6 (six) hours as needed for wheezing or shortness of breath., Disp: 75 mL, Rfl: 11 .  albuterol (VENTOLIN HFA) 108 (90 Base) MCG/ACT inhaler, Inhale 1-2 puffs into the lungs every 6 (six) hours as needed for wheezing or shortness of breath., Disp: 1 Inhaler, Rfl: 12 .  amLODipine (NORVASC) 10 MG tablet, Take 10 mg by mouth daily. , Disp: , Rfl:  .  aspirin EC 81 MG tablet, Take 81 mg by mouth at bedtime. , Disp: , Rfl:  .  atorvastatin (LIPITOR) 40 MG tablet, Take 1 tablet (40 mg total) by mouth daily. At night, Disp: 90 tablet, Rfl: 3 .  AURYXIA 1 GM 210 MG(Fe) tablet, Take 420-630 mg by mouth 3 (three) times daily with meals. Take 630 mg with each meal and 420 mg with each snack, Disp: , Rfl: 12 .  carvedilol (COREG) 12.5 MG tablet, Take 1.5 tablets (18.75 mg) by mouth twice daily, Disp: 270  tablet, Rfl: 3 .  cetirizine (ZYRTEC) 5 MG tablet, Take 1 tablet (5 mg total) by mouth daily as needed for allergies., Disp: 90 tablet, Rfl: 3 .  cinacalcet (SENSIPAR) 60 MG tablet, Take 60 mg by mouth daily., Disp: , Rfl:  .  cloNIDine (CATAPRES) 0.1 MG tablet, Take 0.1 mg by mouth 2 (two) times daily., Disp: , Rfl:  .  furosemide (LASIX) 80 MG tablet, Take 80 mg by mouth See admin instructions. Take 80 mg by mouth once daily on Sun, Tue, Thur, and Sat (non dialysis days), Disp: , Rfl:  .  isosorbide mononitrate (IMDUR) 60 MG 24 hr tablet, Take 1 tablet (60 mg total) by mouth daily., Disp: 30 tablet, Rfl: 0 .  losartan (COZAAR) 50 MG tablet, Take by mouth., Disp: , Rfl:  .  megestrol (MEGACE) 20 MG tablet, Take 20 mg by mouth daily., Disp: , Rfl:  .  meloxicam (MOBIC) 7.5 MG tablet, meloxicam 7.5 mg tablet, Disp: , Rfl:  .  multivitamin (RENA-VIT) TABS tablet, Take 1 tablet by mouth daily., Disp: , Rfl:  .  neomycin-polymyxin b-dexamethasone (MAXITROL) 3.5-10000-0.1 SUSP, , Disp: , Rfl:  .  nitroGLYCERIN (NITROSTAT) 0.4 MG SL tablet, Place 1 tablet (0.4 mg total) under the tongue every 5 (five) minutes as needed for chest pain., Disp: 25 tablet, Rfl: 3 .  ondansetron (ZOFRAN) 4 MG tablet, Take 1 tablet (4 mg total) by mouth every 8 (eight) hours as needed for nausea or vomiting., Disp: 30 tablet, Rfl: 2 .  oxyCODONE-acetaminophen (PERCOCET/ROXICET) 5-325 MG tablet, Take 1 tablet by mouth 2 (two) times daily as needed for pain. , Disp: , Rfl: 0 .  pregabalin (LYRICA) 75 MG capsule, Take 1 capsule (75 mg total) by mouth daily., Disp: 90 capsule, Rfl: 1 .  rOPINIRole (REQUIP) 0.25 MG tablet, Take 0.25 mg by mouth 3 (three) times daily., Disp: , Rfl:  .  umeclidinium-vilanterol (  ANORO ELLIPTA) 62.5-25 MCG/INH AEPB, Inhale 1 puff into the lungs daily., Disp: 1 each, Rfl: 12 .  UNABLE TO FIND, Take by mouth., Disp: , Rfl:  .  UNABLE TO FIND, TEST BLOOD SUGARS 2 TIMES DAILY, Disp: , Rfl:  .  UNABLE TO  FIND, calcium acetate(phosphate binders) 667 mg capsule, Disp: , Rfl:   Current Facility-Administered Medications:  .  betamethasone acetate-betamethasone sodium phosphate (CELESTONE) injection 3 mg, 3 mg, Intramuscular, Once, Evans, Dorathy Daft, DPM  EXAM: telephone  VITALS per patient if applicable:  GENERAL: alert, oriented, appears well and in no acute distress  PSYCH/NEURO: pleasant and cooperative, no obvious depression or anxiety, speech and thought processing grossly intact  ASSESSMENT AND PLAN:  Discussed the following assessment and plan:  Allergic rhinitis, unspecified seasonality, unspecified trigger - Plan: cetirizine (ZYRTEC) 5 MG tablet take after HD daily prn  Contact eye MD if eyes continue to itch for eye drops  Suspected Chronic obstructive pulmonary disease, unspecified COPD type ([place]) - Plan: albuterol (PROVENTIL) (2.5 MG/3ML) 0.083% nebulizer solution, albuterol (VENTOLIN HFA) 108 (90 Base) MCG/ACT inhaler, umeclidinium-vilanterol (ANORO ELLIPTA) 62.5-25 MCG/INH AEPB  ESRD on hemodialysis ([place]) TTHS HD renal Dr. Holley Raring concerned with fluctuating BP on meds norvasc 10, clonidine 0.1 bid, Losartan 50 mg qd, imdur 60 mg ER, coreg 18.75 mg bid lasix 80 mg qd.  -will CC Dr. Holley Raring to see if he has ideas to better control BP (see HPI) due to its variability and also if he would consider ambulatory BP monitor.  -I would like copy of recently labs faxed to [phone]  -she was instructed to take TUMS and is taking 1-2 x per day due to hypocalcemia   Essential (primary) hypertension -cont meds   OSA not on cpap could not tolerate full face mask and pt was told needed repeat sleep study to get nasal prongs w chin strap so she turned machine back in  -disc with pt Dr. Mortimer Fries in future may be able to coordinate home sleep study which may be cheaper than facility to do sleep study   Tremor-neuro f/u 11/2018 she does not think requip 0.25 tid is helping advised her to call them  back sooner if needed   HM Flu shot utd pna 23 utd  Tetanus UTD  Hep B immune  Consider shingrix in future disc today pt unsure if insurance will cover it   Pap neg 11/13/16 neg HPVs/p hysterectomy  mammo neg9/24/19referred mammogram today   h/o multiple polypscolonoscopy 08/20/17 repeat in 3 years [facility] -EGD 03/11/18 neg gastritis  Former smoker x 25-30 years <1 ppdCTA chest 01/12/18 moderate b/l pleural effusions and CAD and aortic atherosclerosis, no nodules  Does not make urine  Last A1C 4.87/8/19 with HD prior to that10/2018 5.6 per HD RN Last A1C 4.4 since last visit 03/2018 or 04/2018 off DM meds since 2016  [place] 06/16/17 right upper thyroid nodule ? Parathyroid mass 1.1 cm and left mid nodule f/u annually  -was sch 08/06/18 but this was canceled consider resch in the future   Labs HD 05/17/18 Albumin 3.8, hbg 11.1, Calcium 8.2, phose 6.4 elevated nl 3.0-5.5 , PTH 534 nl 150-600, K 4.0 nl, vitamin D 7 (02/22/16)  Lipid 02/15/18 TC 175 Glucose 05/17/18 128 A1C 03/22/18 4.4    I discussed the assessment and treatment plan with the patient. The patient was provided an opportunity to ask questions and all were answered. The patient agreed with the plan and demonstrated an understanding of the  instructions.   The patient was advised to call back or seek an in-person evaluation if the symptoms worsen or if the condition fails to improve as anticipated.  Time spent 15 minutes Delorise Jackson, MD

## 2018-09-24 ENCOUNTER — Other Ambulatory Visit: Payer: Self-pay | Admitting: *Deleted

## 2018-09-24 DIAGNOSIS — Z298 Encounter for other specified prophylactic measures: Secondary | ICD-10-CM | POA: Diagnosis not present

## 2018-09-24 DIAGNOSIS — Z992 Dependence on renal dialysis: Secondary | ICD-10-CM | POA: Diagnosis not present

## 2018-09-24 DIAGNOSIS — D509 Iron deficiency anemia, unspecified: Secondary | ICD-10-CM | POA: Diagnosis not present

## 2018-09-24 DIAGNOSIS — N2581 Secondary hyperparathyroidism of renal origin: Secondary | ICD-10-CM | POA: Diagnosis not present

## 2018-09-24 DIAGNOSIS — N186 End stage renal disease: Secondary | ICD-10-CM | POA: Diagnosis not present

## 2018-09-24 DIAGNOSIS — D631 Anemia in chronic kidney disease: Secondary | ICD-10-CM | POA: Diagnosis not present

## 2018-09-24 NOTE — Patient Outreach (Signed)
Referral received for insurance screening, outreach call to pt, spoke with pt, HIPAA verified, pt states she is at dialysis and cannot talk, Rn CM will need to call on Tuesday or Thursday.  PLAN Outreach pt next week Tuesday or Thursday  Jacqlyn Larsen [facility], [place] Coordinator [phone]

## 2018-09-27 DIAGNOSIS — N186 End stage renal disease: Secondary | ICD-10-CM | POA: Diagnosis not present

## 2018-09-27 DIAGNOSIS — D631 Anemia in chronic kidney disease: Secondary | ICD-10-CM | POA: Diagnosis not present

## 2018-09-27 DIAGNOSIS — D509 Iron deficiency anemia, unspecified: Secondary | ICD-10-CM | POA: Diagnosis not present

## 2018-09-27 DIAGNOSIS — N2581 Secondary hyperparathyroidism of renal origin: Secondary | ICD-10-CM | POA: Diagnosis not present

## 2018-09-27 DIAGNOSIS — Z298 Encounter for other specified prophylactic measures: Secondary | ICD-10-CM | POA: Diagnosis not present

## 2018-09-27 DIAGNOSIS — Z992 Dependence on renal dialysis: Secondary | ICD-10-CM | POA: Diagnosis not present

## 2018-09-28 ENCOUNTER — Other Ambulatory Visit: Payer: Self-pay | Admitting: *Deleted

## 2018-09-28 ENCOUNTER — Encounter: Payer: Self-pay | Admitting: Internal Medicine

## 2018-09-28 NOTE — Patient Outreach (Signed)
Outreach call to pt for screening/ insurance referral (2nd attempt) and called on Tuesday or Thursday as per pt request as she has dialysis on M,W,F.  No answer to telephone and no option to leave voicemail.  Rn CM mailed unsuccessful outreach letter to pt home.  PLAN Outreach pt in 3-4 business days  Jacqlyn Larsen [facility], [place] [phone]

## 2018-09-29 DIAGNOSIS — N2581 Secondary hyperparathyroidism of renal origin: Secondary | ICD-10-CM | POA: Diagnosis not present

## 2018-09-29 DIAGNOSIS — D509 Iron deficiency anemia, unspecified: Secondary | ICD-10-CM | POA: Diagnosis not present

## 2018-09-29 DIAGNOSIS — Z298 Encounter for other specified prophylactic measures: Secondary | ICD-10-CM | POA: Diagnosis not present

## 2018-09-29 DIAGNOSIS — D631 Anemia in chronic kidney disease: Secondary | ICD-10-CM | POA: Diagnosis not present

## 2018-09-29 DIAGNOSIS — N186 End stage renal disease: Secondary | ICD-10-CM | POA: Diagnosis not present

## 2018-09-29 DIAGNOSIS — Z992 Dependence on renal dialysis: Secondary | ICD-10-CM | POA: Diagnosis not present

## 2018-09-30 ENCOUNTER — Ambulatory Visit: Payer: Medicare Other | Admitting: Internal Medicine

## 2018-10-01 ENCOUNTER — Other Ambulatory Visit: Payer: Self-pay | Admitting: *Deleted

## 2018-10-01 DIAGNOSIS — N2581 Secondary hyperparathyroidism of renal origin: Secondary | ICD-10-CM | POA: Diagnosis not present

## 2018-10-01 DIAGNOSIS — Z992 Dependence on renal dialysis: Secondary | ICD-10-CM | POA: Diagnosis not present

## 2018-10-01 DIAGNOSIS — Z298 Encounter for other specified prophylactic measures: Secondary | ICD-10-CM | POA: Diagnosis not present

## 2018-10-01 DIAGNOSIS — D631 Anemia in chronic kidney disease: Secondary | ICD-10-CM | POA: Diagnosis not present

## 2018-10-01 DIAGNOSIS — N186 End stage renal disease: Secondary | ICD-10-CM | POA: Diagnosis not present

## 2018-10-01 DIAGNOSIS — D509 Iron deficiency anemia, unspecified: Secondary | ICD-10-CM | POA: Diagnosis not present

## 2018-10-01 NOTE — Patient Outreach (Signed)
Outreach call to pt for screening (3rd attempt), no answer to telephone and no option to leave voicemail.  RN CM had previously spoken with pt on 09/24/18 but has since been unable to get in touch with pt and pt has not called back.  RN CM mailed unsuccessful outreach letter today.  PLAN Close case in 3-4 business days if no response back from pt  Jacqlyn Larsen [facility], [place] Coordinator [phone]

## 2018-10-02 IMAGING — CR DG CHEST 2V
1 series · 2 of 2 positions shown · non-contrast
Comparison: 01/12/2018 CT of the chest

CLINICAL DATA: Nausea and vomiting, follow-up pleural effusions

EXAM:
CHEST - 2 VIEW

[Series 1: dg chest 2 view · 0.14mm/px · 2 of 2 slices shown]
[im 1/2]
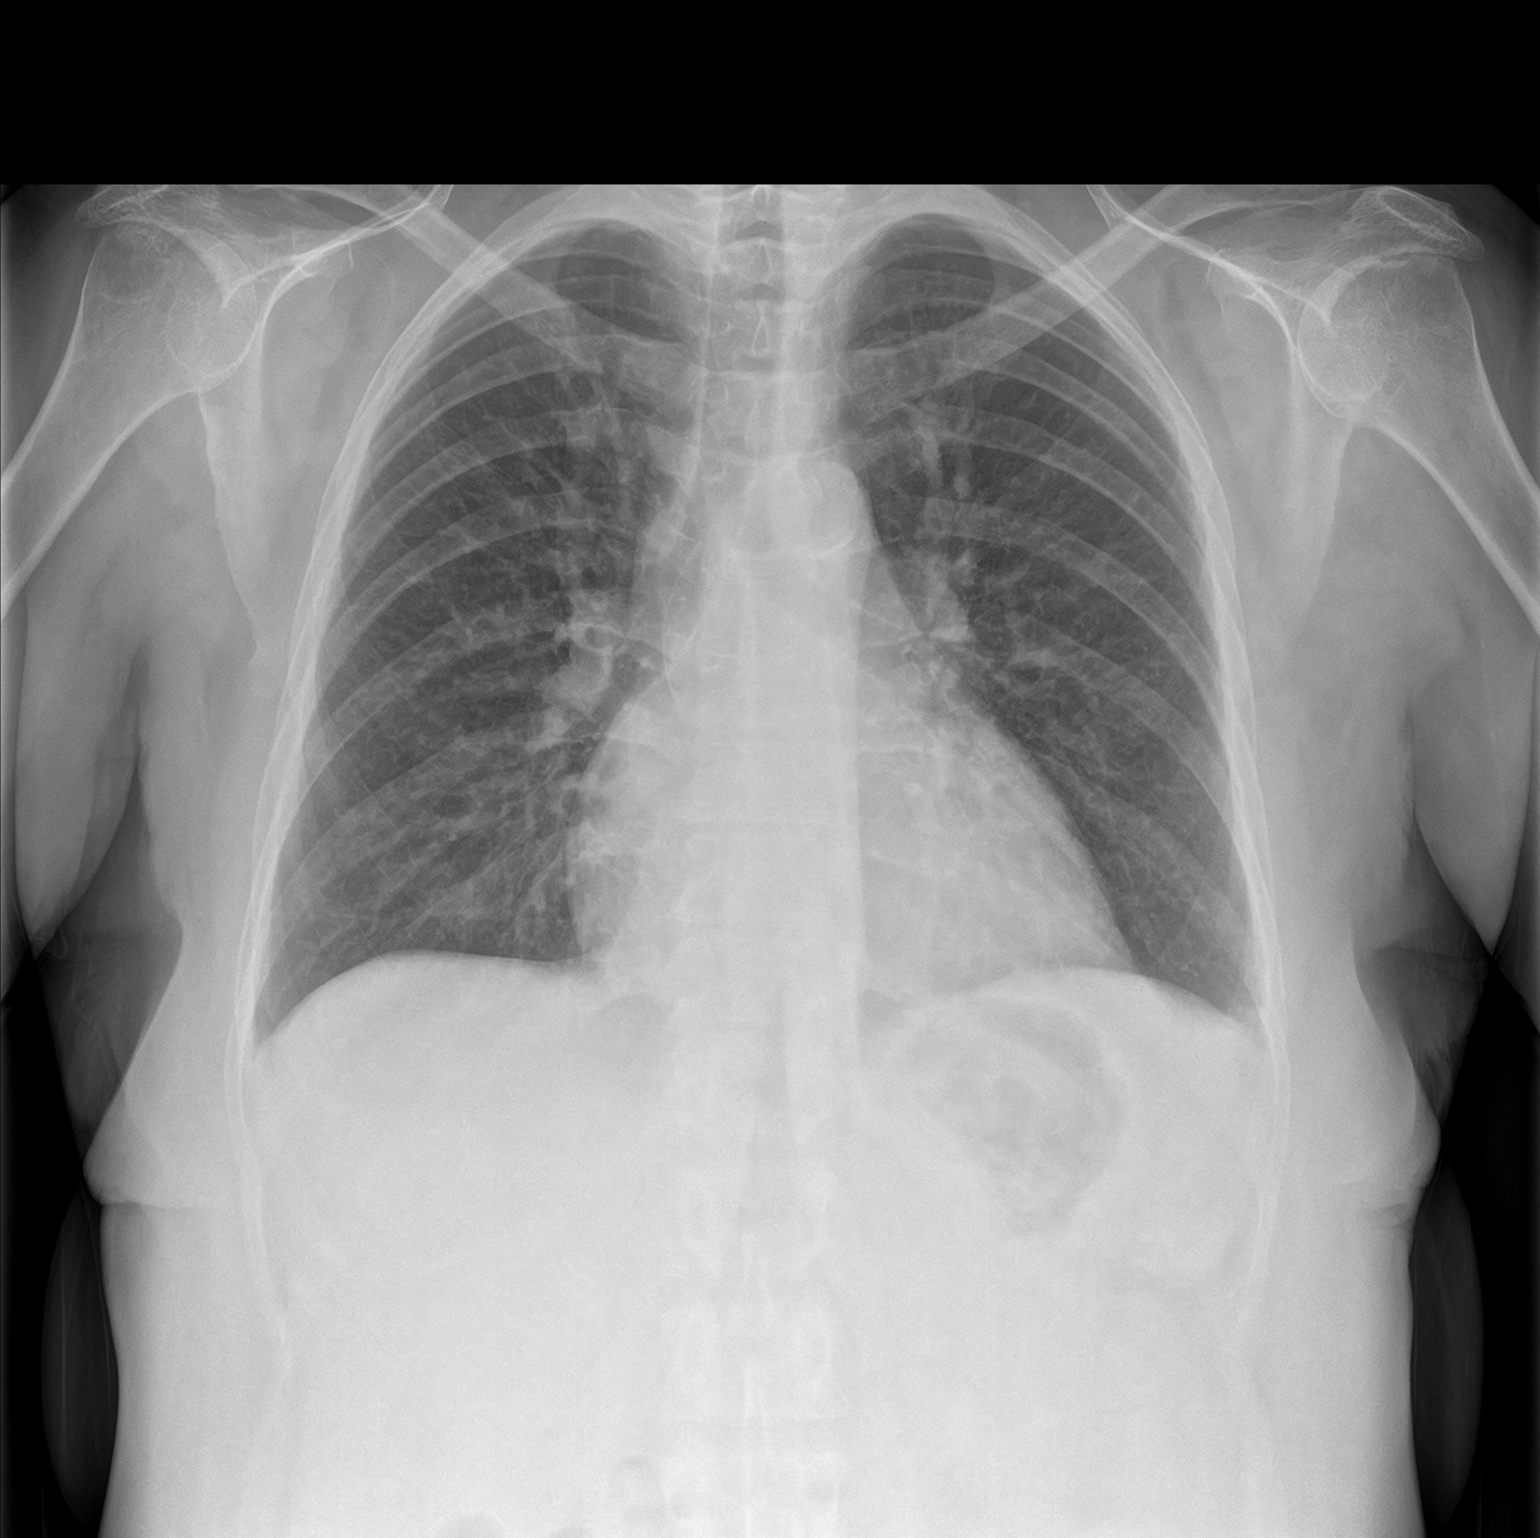
[im 2/2]
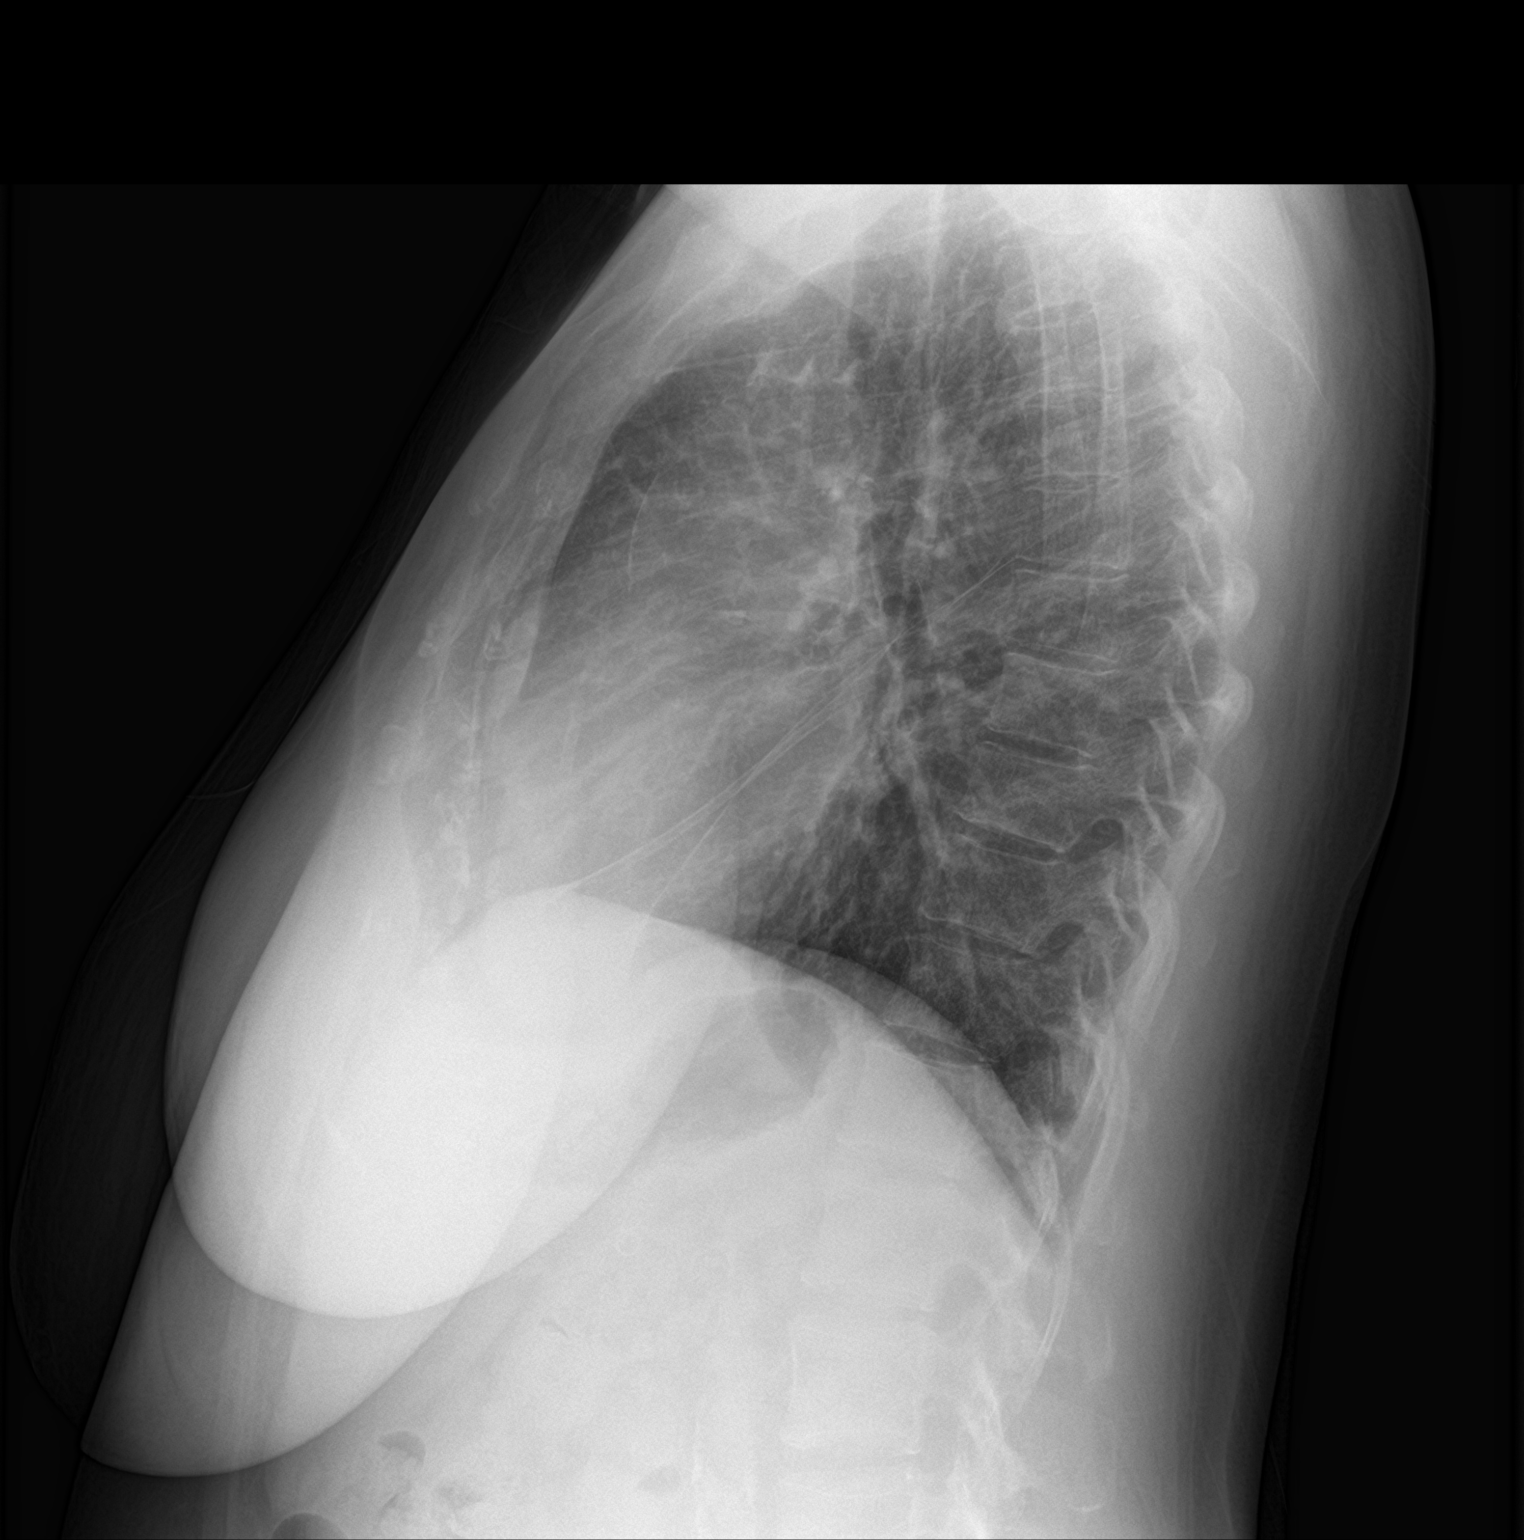

[2 of 2 positions shown; findings below may reference images not displayed]

FINDINGS: Cardiac shadow is within normal limits. The lungs are well aerated
bilaterally. Previously seen lower lobe atelectasis and
moderate-sized pleural effusions have resolved in the interval. No
bony abnormality is noted.
IMPRESSION: Normal chest. Resolution of previously seen effusions and
atelectasis.

## 2018-10-04 DIAGNOSIS — Z992 Dependence on renal dialysis: Secondary | ICD-10-CM | POA: Diagnosis not present

## 2018-10-04 DIAGNOSIS — D509 Iron deficiency anemia, unspecified: Secondary | ICD-10-CM | POA: Diagnosis not present

## 2018-10-04 DIAGNOSIS — N2581 Secondary hyperparathyroidism of renal origin: Secondary | ICD-10-CM | POA: Diagnosis not present

## 2018-10-04 DIAGNOSIS — N186 End stage renal disease: Secondary | ICD-10-CM | POA: Diagnosis not present

## 2018-10-04 DIAGNOSIS — D631 Anemia in chronic kidney disease: Secondary | ICD-10-CM | POA: Diagnosis not present

## 2018-10-04 DIAGNOSIS — Z298 Encounter for other specified prophylactic measures: Secondary | ICD-10-CM | POA: Diagnosis not present

## 2018-10-06 ENCOUNTER — Other Ambulatory Visit: Payer: Self-pay | Admitting: *Deleted

## 2018-10-06 DIAGNOSIS — N2581 Secondary hyperparathyroidism of renal origin: Secondary | ICD-10-CM | POA: Diagnosis not present

## 2018-10-06 DIAGNOSIS — Z298 Encounter for other specified prophylactic measures: Secondary | ICD-10-CM | POA: Diagnosis not present

## 2018-10-06 DIAGNOSIS — D509 Iron deficiency anemia, unspecified: Secondary | ICD-10-CM | POA: Diagnosis not present

## 2018-10-06 DIAGNOSIS — D631 Anemia in chronic kidney disease: Secondary | ICD-10-CM | POA: Diagnosis not present

## 2018-10-06 DIAGNOSIS — N186 End stage renal disease: Secondary | ICD-10-CM | POA: Diagnosis not present

## 2018-10-06 DIAGNOSIS — Z992 Dependence on renal dialysis: Secondary | ICD-10-CM | POA: Diagnosis not present

## 2018-10-06 NOTE — Patient Outreach (Signed)
Unable to maintain contact with pt, case closed.  Jacqlyn Larsen [facility], [place] Coordinator [phone]

## 2018-10-07 DIAGNOSIS — Z992 Dependence on renal dialysis: Secondary | ICD-10-CM | POA: Diagnosis not present

## 2018-10-07 DIAGNOSIS — N186 End stage renal disease: Secondary | ICD-10-CM | POA: Diagnosis not present

## 2018-10-08 DIAGNOSIS — Z992 Dependence on renal dialysis: Secondary | ICD-10-CM | POA: Diagnosis not present

## 2018-10-08 DIAGNOSIS — N2581 Secondary hyperparathyroidism of renal origin: Secondary | ICD-10-CM | POA: Diagnosis not present

## 2018-10-08 DIAGNOSIS — N186 End stage renal disease: Secondary | ICD-10-CM | POA: Diagnosis not present

## 2018-10-08 DIAGNOSIS — D631 Anemia in chronic kidney disease: Secondary | ICD-10-CM | POA: Diagnosis not present

## 2018-10-08 DIAGNOSIS — D509 Iron deficiency anemia, unspecified: Secondary | ICD-10-CM | POA: Diagnosis not present

## 2018-10-11 ENCOUNTER — Encounter: Admit: 2018-10-11 | Discharge: 2018-10-11 | Payer: MEDICARE | Attending: Nephrology | Primary: Nephrology

## 2018-10-11 DIAGNOSIS — Z992 Dependence on renal dialysis: Secondary | ICD-10-CM | POA: Diagnosis not present

## 2018-10-11 DIAGNOSIS — N186 End stage renal disease: Secondary | ICD-10-CM | POA: Diagnosis not present

## 2018-10-11 DIAGNOSIS — N2581 Secondary hyperparathyroidism of renal origin: Secondary | ICD-10-CM | POA: Diagnosis not present

## 2018-10-11 DIAGNOSIS — D509 Iron deficiency anemia, unspecified: Secondary | ICD-10-CM | POA: Diagnosis not present

## 2018-10-11 DIAGNOSIS — D631 Anemia in chronic kidney disease: Secondary | ICD-10-CM | POA: Diagnosis not present

## 2018-10-13 DIAGNOSIS — N186 End stage renal disease: Secondary | ICD-10-CM | POA: Diagnosis not present

## 2018-10-13 DIAGNOSIS — D631 Anemia in chronic kidney disease: Secondary | ICD-10-CM | POA: Diagnosis not present

## 2018-10-13 DIAGNOSIS — N2581 Secondary hyperparathyroidism of renal origin: Secondary | ICD-10-CM | POA: Diagnosis not present

## 2018-10-13 DIAGNOSIS — D509 Iron deficiency anemia, unspecified: Secondary | ICD-10-CM | POA: Diagnosis not present

## 2018-10-13 DIAGNOSIS — Z992 Dependence on renal dialysis: Secondary | ICD-10-CM | POA: Diagnosis not present

## 2018-10-15 DIAGNOSIS — Z01818 Encounter for other preprocedural examination: Secondary | ICD-10-CM

## 2018-10-15 DIAGNOSIS — Z7682 Awaiting organ transplant status: Principal | ICD-10-CM

## 2018-10-15 DIAGNOSIS — N186 End stage renal disease: Secondary | ICD-10-CM

## 2018-10-15 DIAGNOSIS — D631 Anemia in chronic kidney disease: Secondary | ICD-10-CM | POA: Diagnosis not present

## 2018-10-15 DIAGNOSIS — N2581 Secondary hyperparathyroidism of renal origin: Secondary | ICD-10-CM | POA: Diagnosis not present

## 2018-10-15 DIAGNOSIS — Z992 Dependence on renal dialysis: Secondary | ICD-10-CM | POA: Diagnosis not present

## 2018-10-15 DIAGNOSIS — D509 Iron deficiency anemia, unspecified: Secondary | ICD-10-CM | POA: Diagnosis not present

## 2018-10-17 DIAGNOSIS — N186 End stage renal disease: Secondary | ICD-10-CM | POA: Diagnosis not present

## 2018-10-17 DIAGNOSIS — N2581 Secondary hyperparathyroidism of renal origin: Secondary | ICD-10-CM | POA: Diagnosis not present

## 2018-10-17 DIAGNOSIS — D631 Anemia in chronic kidney disease: Secondary | ICD-10-CM | POA: Diagnosis not present

## 2018-10-17 DIAGNOSIS — D509 Iron deficiency anemia, unspecified: Secondary | ICD-10-CM | POA: Diagnosis not present

## 2018-10-17 DIAGNOSIS — Z992 Dependence on renal dialysis: Secondary | ICD-10-CM | POA: Diagnosis not present

## 2018-10-18 DIAGNOSIS — N186 End stage renal disease: Secondary | ICD-10-CM | POA: Diagnosis not present

## 2018-10-18 DIAGNOSIS — Z992 Dependence on renal dialysis: Secondary | ICD-10-CM | POA: Diagnosis not present

## 2018-10-18 DIAGNOSIS — D631 Anemia in chronic kidney disease: Secondary | ICD-10-CM | POA: Diagnosis not present

## 2018-10-18 DIAGNOSIS — N2581 Secondary hyperparathyroidism of renal origin: Secondary | ICD-10-CM | POA: Diagnosis not present

## 2018-10-18 DIAGNOSIS — D509 Iron deficiency anemia, unspecified: Secondary | ICD-10-CM | POA: Diagnosis not present

## 2018-10-20 DIAGNOSIS — D631 Anemia in chronic kidney disease: Secondary | ICD-10-CM | POA: Diagnosis not present

## 2018-10-20 DIAGNOSIS — D509 Iron deficiency anemia, unspecified: Secondary | ICD-10-CM | POA: Diagnosis not present

## 2018-10-20 DIAGNOSIS — N186 End stage renal disease: Secondary | ICD-10-CM | POA: Diagnosis not present

## 2018-10-20 DIAGNOSIS — N2581 Secondary hyperparathyroidism of renal origin: Secondary | ICD-10-CM | POA: Diagnosis not present

## 2018-10-20 DIAGNOSIS — Z992 Dependence on renal dialysis: Secondary | ICD-10-CM | POA: Diagnosis not present

## 2018-10-22 DIAGNOSIS — N186 End stage renal disease: Secondary | ICD-10-CM | POA: Diagnosis not present

## 2018-10-22 DIAGNOSIS — D509 Iron deficiency anemia, unspecified: Secondary | ICD-10-CM | POA: Diagnosis not present

## 2018-10-22 DIAGNOSIS — D631 Anemia in chronic kidney disease: Secondary | ICD-10-CM | POA: Diagnosis not present

## 2018-10-22 DIAGNOSIS — Z992 Dependence on renal dialysis: Secondary | ICD-10-CM | POA: Diagnosis not present

## 2018-10-22 DIAGNOSIS — N2581 Secondary hyperparathyroidism of renal origin: Secondary | ICD-10-CM | POA: Diagnosis not present

## 2018-10-25 DIAGNOSIS — Z992 Dependence on renal dialysis: Secondary | ICD-10-CM | POA: Diagnosis not present

## 2018-10-25 DIAGNOSIS — D631 Anemia in chronic kidney disease: Secondary | ICD-10-CM | POA: Diagnosis not present

## 2018-10-25 DIAGNOSIS — N186 End stage renal disease: Secondary | ICD-10-CM | POA: Diagnosis not present

## 2018-10-25 DIAGNOSIS — N2581 Secondary hyperparathyroidism of renal origin: Secondary | ICD-10-CM | POA: Diagnosis not present

## 2018-10-25 DIAGNOSIS — D509 Iron deficiency anemia, unspecified: Secondary | ICD-10-CM | POA: Diagnosis not present

## 2018-10-27 DIAGNOSIS — D509 Iron deficiency anemia, unspecified: Secondary | ICD-10-CM | POA: Diagnosis not present

## 2018-10-27 DIAGNOSIS — N186 End stage renal disease: Secondary | ICD-10-CM | POA: Diagnosis not present

## 2018-10-27 DIAGNOSIS — D631 Anemia in chronic kidney disease: Secondary | ICD-10-CM | POA: Diagnosis not present

## 2018-10-27 DIAGNOSIS — N2581 Secondary hyperparathyroidism of renal origin: Secondary | ICD-10-CM | POA: Diagnosis not present

## 2018-10-27 DIAGNOSIS — Z992 Dependence on renal dialysis: Secondary | ICD-10-CM | POA: Diagnosis not present

## 2018-10-29 DIAGNOSIS — N186 End stage renal disease: Secondary | ICD-10-CM | POA: Diagnosis not present

## 2018-10-29 DIAGNOSIS — Z992 Dependence on renal dialysis: Secondary | ICD-10-CM | POA: Diagnosis not present

## 2018-10-29 DIAGNOSIS — D509 Iron deficiency anemia, unspecified: Secondary | ICD-10-CM | POA: Diagnosis not present

## 2018-10-29 DIAGNOSIS — D631 Anemia in chronic kidney disease: Secondary | ICD-10-CM | POA: Diagnosis not present

## 2018-10-29 DIAGNOSIS — N2581 Secondary hyperparathyroidism of renal origin: Secondary | ICD-10-CM | POA: Diagnosis not present

## 2018-11-01 DIAGNOSIS — N2581 Secondary hyperparathyroidism of renal origin: Secondary | ICD-10-CM | POA: Diagnosis not present

## 2018-11-01 DIAGNOSIS — D509 Iron deficiency anemia, unspecified: Secondary | ICD-10-CM | POA: Diagnosis not present

## 2018-11-01 DIAGNOSIS — Z992 Dependence on renal dialysis: Secondary | ICD-10-CM | POA: Diagnosis not present

## 2018-11-01 DIAGNOSIS — N186 End stage renal disease: Secondary | ICD-10-CM | POA: Diagnosis not present

## 2018-11-01 DIAGNOSIS — D631 Anemia in chronic kidney disease: Secondary | ICD-10-CM | POA: Diagnosis not present

## 2018-11-03 DIAGNOSIS — N2581 Secondary hyperparathyroidism of renal origin: Secondary | ICD-10-CM | POA: Diagnosis not present

## 2018-11-03 DIAGNOSIS — Z992 Dependence on renal dialysis: Secondary | ICD-10-CM | POA: Diagnosis not present

## 2018-11-03 DIAGNOSIS — N186 End stage renal disease: Secondary | ICD-10-CM | POA: Diagnosis not present

## 2018-11-03 DIAGNOSIS — D509 Iron deficiency anemia, unspecified: Secondary | ICD-10-CM | POA: Diagnosis not present

## 2018-11-03 DIAGNOSIS — D631 Anemia in chronic kidney disease: Secondary | ICD-10-CM | POA: Diagnosis not present

## 2018-11-05 DIAGNOSIS — N186 End stage renal disease: Secondary | ICD-10-CM | POA: Diagnosis not present

## 2018-11-05 DIAGNOSIS — D509 Iron deficiency anemia, unspecified: Secondary | ICD-10-CM | POA: Diagnosis not present

## 2018-11-05 DIAGNOSIS — D631 Anemia in chronic kidney disease: Secondary | ICD-10-CM | POA: Diagnosis not present

## 2018-11-05 DIAGNOSIS — N2581 Secondary hyperparathyroidism of renal origin: Secondary | ICD-10-CM | POA: Diagnosis not present

## 2018-11-05 DIAGNOSIS — Z992 Dependence on renal dialysis: Secondary | ICD-10-CM | POA: Diagnosis not present

## 2018-11-07 DIAGNOSIS — N186 End stage renal disease: Secondary | ICD-10-CM | POA: Diagnosis not present

## 2018-11-07 DIAGNOSIS — Z992 Dependence on renal dialysis: Secondary | ICD-10-CM | POA: Diagnosis not present

## 2018-11-08 DIAGNOSIS — N2581 Secondary hyperparathyroidism of renal origin: Secondary | ICD-10-CM | POA: Diagnosis not present

## 2018-11-08 DIAGNOSIS — D631 Anemia in chronic kidney disease: Secondary | ICD-10-CM | POA: Diagnosis not present

## 2018-11-08 DIAGNOSIS — D509 Iron deficiency anemia, unspecified: Secondary | ICD-10-CM | POA: Diagnosis not present

## 2018-11-08 DIAGNOSIS — N186 End stage renal disease: Secondary | ICD-10-CM | POA: Diagnosis not present

## 2018-11-08 DIAGNOSIS — Z992 Dependence on renal dialysis: Secondary | ICD-10-CM | POA: Diagnosis not present

## 2018-11-10 DIAGNOSIS — D509 Iron deficiency anemia, unspecified: Secondary | ICD-10-CM | POA: Diagnosis not present

## 2018-11-10 DIAGNOSIS — N186 End stage renal disease: Secondary | ICD-10-CM | POA: Diagnosis not present

## 2018-11-10 DIAGNOSIS — N2581 Secondary hyperparathyroidism of renal origin: Secondary | ICD-10-CM | POA: Diagnosis not present

## 2018-11-10 DIAGNOSIS — Z992 Dependence on renal dialysis: Secondary | ICD-10-CM | POA: Diagnosis not present

## 2018-11-10 DIAGNOSIS — D631 Anemia in chronic kidney disease: Secondary | ICD-10-CM | POA: Diagnosis not present

## 2018-11-12 DIAGNOSIS — N2581 Secondary hyperparathyroidism of renal origin: Secondary | ICD-10-CM | POA: Diagnosis not present

## 2018-11-12 DIAGNOSIS — Z992 Dependence on renal dialysis: Secondary | ICD-10-CM | POA: Diagnosis not present

## 2018-11-12 DIAGNOSIS — D631 Anemia in chronic kidney disease: Secondary | ICD-10-CM | POA: Diagnosis not present

## 2018-11-12 DIAGNOSIS — N186 End stage renal disease: Secondary | ICD-10-CM | POA: Diagnosis not present

## 2018-11-12 DIAGNOSIS — D509 Iron deficiency anemia, unspecified: Secondary | ICD-10-CM | POA: Diagnosis not present

## 2018-11-15 ENCOUNTER — Encounter: Admit: 2018-11-15 | Discharge: 2018-11-15 | Payer: MEDICARE | Attending: Nephrology | Primary: Nephrology

## 2018-11-15 DIAGNOSIS — D631 Anemia in chronic kidney disease: Secondary | ICD-10-CM | POA: Diagnosis not present

## 2018-11-15 DIAGNOSIS — Z992 Dependence on renal dialysis: Secondary | ICD-10-CM | POA: Diagnosis not present

## 2018-11-15 DIAGNOSIS — N186 End stage renal disease: Secondary | ICD-10-CM | POA: Diagnosis not present

## 2018-11-15 DIAGNOSIS — N2581 Secondary hyperparathyroidism of renal origin: Secondary | ICD-10-CM | POA: Diagnosis not present

## 2018-11-15 DIAGNOSIS — D509 Iron deficiency anemia, unspecified: Secondary | ICD-10-CM | POA: Diagnosis not present

## 2018-11-16 DIAGNOSIS — R251 Tremor, unspecified: Secondary | ICD-10-CM | POA: Diagnosis not present

## 2018-11-17 DIAGNOSIS — N2581 Secondary hyperparathyroidism of renal origin: Secondary | ICD-10-CM | POA: Diagnosis not present

## 2018-11-17 DIAGNOSIS — D509 Iron deficiency anemia, unspecified: Secondary | ICD-10-CM | POA: Diagnosis not present

## 2018-11-17 DIAGNOSIS — N186 End stage renal disease: Secondary | ICD-10-CM | POA: Diagnosis not present

## 2018-11-17 DIAGNOSIS — D631 Anemia in chronic kidney disease: Secondary | ICD-10-CM | POA: Diagnosis not present

## 2018-11-17 DIAGNOSIS — Z992 Dependence on renal dialysis: Secondary | ICD-10-CM | POA: Diagnosis not present

## 2018-11-18 ENCOUNTER — Ambulatory Visit (INDEPENDENT_AMBULATORY_CARE_PROVIDER_SITE_OTHER): Payer: Medicare Other | Admitting: Podiatry

## 2018-11-18 ENCOUNTER — Telehealth: Payer: Self-pay

## 2018-11-18 ENCOUNTER — Encounter: Payer: Self-pay | Admitting: Podiatry

## 2018-11-18 ENCOUNTER — Other Ambulatory Visit: Payer: Self-pay

## 2018-11-18 DIAGNOSIS — B351 Tinea unguium: Secondary | ICD-10-CM | POA: Diagnosis not present

## 2018-11-18 DIAGNOSIS — M79674 Pain in right toe(s): Secondary | ICD-10-CM | POA: Diagnosis not present

## 2018-11-18 DIAGNOSIS — M79675 Pain in left toe(s): Secondary | ICD-10-CM | POA: Diagnosis not present

## 2018-11-18 DIAGNOSIS — E1142 Type 2 diabetes mellitus with diabetic polyneuropathy: Secondary | ICD-10-CM | POA: Diagnosis not present

## 2018-11-18 NOTE — Telephone Encounter (Signed)
Virtual Visit Pre-Appointment Phone Call  "Theresa [name], I am calling you today to discuss your upcoming appointment. We are currently trying to limit exposure to the virus that causes COVID-19 by seeing patients at home rather than in the office."  1. "What is the BEST phone number to call the day of the visit?" - include this in appointment notes  2. "Do you have or have access to (through a family member/friend) a smartphone with video capability that we can use for your visit?" a. If yes - list this number in appt notes as "cell" (if different from BEST phone #) and list the appointment type as a VIDEO visit in appointment notes b. If no - list the appointment type as a PHONE visit in appointment notes  3. Confirm consent - "In the setting of the current Covid19 crisis, you are scheduled for a video visit with your provider on 12/02/2018 at 10:30AM.  Just as we do with many in-office visits, in order for you to participate in this visit, we must obtain consent.  If you'd like, I can send this to your mychart (if signed up) or email for you to review.  Otherwise, I can obtain your verbal consent now.  All virtual visits are billed to your insurance company just like a normal visit would be.  By agreeing to a virtual visit, we'd like you to understand that the technology does not allow for your provider to perform an examination, and thus may limit your provider's ability to fully assess your condition. If your provider identifies any concerns that need to be evaluated in person, we will make arrangements to do so.  Finally, though the technology is pretty good, we cannot assure that it will always work on either your or our end, and in the setting of a video visit, we may have to convert it to a phone-only visit.  In either situation, we cannot ensure that we have a secure connection.  Are you willing to proceed?" STAFF: Did the patient verbally acknowledge consent to telehealth visit? Document YES/NO  here: YES  4. Advise patient to be prepared - "Two hours prior to your appointment, go ahead and check your blood pressure, pulse, oxygen saturation, and your weight (if you have the equipment to check those) and write them all down. When your visit starts, your provider will ask you for this information. If you have an Apple Watch or Kardia device, please plan to have heart rate information ready on the day of your appointment. Please have a pen and paper handy nearby the day of the visit as well."  5. Give patient instructions for MyChart download to smartphone OR [email or web address] as below if video visit (depending on what platform provider is using)  6. Inform patient they will receive a phone call 15 minutes prior to their appointment time (may be from unknown caller ID) so they should be prepared to answer    TELEPHONE CALL NOTE  Theresa [name] has been deemed a candidate for a follow-up tele-health visit to limit community exposure during the Covid-19 pandemic. I spoke with the patient via phone to ensure availability of phone/video source, confirm preferred email & phone number, and discuss instructions and expectations.  I reminded Theresa [name] to be prepared with any vital sign and/or heart rhythm information that could potentially be obtained via home monitoring, at the time of her visit. I reminded Theresa [name] to expect a phone call prior to her visit.  Rene Paci McClain 11/18/2018 10:53 AM   INSTRUCTIONS FOR DOWNLOADING THE MYCHART APP TO SMARTPHONE  - The patient must first make sure to have activated MyChart and know their login information - If Apple, go to [employer] and type in MyChart in the search bar and download the app. If Android, ask patient to go to [employer] and type in [place] in the search bar and download the app. The app is free but as with any other app downloads, their phone may require them to verify saved payment information or Apple/Android  password.  - The patient will need to then log into the app with their MyChart username and password, and select [facility] as their healthcare provider to link the account. When it is time for your visit, go to the MyChart app, find appointments, and click Begin Video Visit. Be sure to Select Allow for your device to access the Microphone and Camera for your visit. You will then be connected, and your provider will be with you shortly.  **If they have any issues connecting, or need assistance please contact MyChart service desk [phone])**  **If using a computer, in order to ensure the best quality for their visit they will need to use either of the following Internet Browsers: [employer], or Google Chrome**  IF USING DOXIMITY or DOXY.ME - The patient will receive a link just prior to their visit by text.     FULL LENGTH CONSENT FOR TELE-HEALTH VISIT   I hereby voluntarily request, consent and authorize [facility] and its employed or contracted physicians, physician assistants, nurse practitioners or other licensed health care professionals (the Practitioner), to provide me with telemedicine health care services (the "Services") as deemed necessary by the treating Practitioner. I acknowledge and consent to receive the Services by the Practitioner via telemedicine. I understand that the telemedicine visit will involve communicating with the Practitioner through live audiovisual communication technology and the disclosure of certain medical information by electronic transmission. I acknowledge that I have been given the opportunity to request an in-person assessment or other available alternative prior to the telemedicine visit and am voluntarily participating in the telemedicine visit.  I understand that I have the right to withhold or withdraw my consent to the use of telemedicine in the course of my care at any time, without affecting my right to future care or treatment,  and that the Practitioner or I may terminate the telemedicine visit at any time. I understand that I have the right to inspect all information obtained and/or recorded in the course of the telemedicine visit and may receive copies of available information for a reasonable fee.  I understand that some of the potential risks of receiving the Services via telemedicine include:  Marland Kitchen Delay or interruption in medical evaluation due to technological equipment failure or disruption; . Information transmitted may not be sufficient (e.g. poor resolution of images) to allow for appropriate medical decision making by the Practitioner; and/or  . In rare instances, security protocols could fail, causing a breach of personal health information.  Furthermore, I acknowledge that it is my responsibility to provide information about my medical history, conditions and care that is complete and accurate to the best of my ability. I acknowledge that Practitioner's advice, recommendations, and/or decision may be based on factors not within their control, such as incomplete or inaccurate data provided by me or distortions of diagnostic images or specimens that may result from electronic transmissions. I understand that  the practice of medicine is not an exact science and that Practitioner makes no warranties or guarantees regarding treatment outcomes. I acknowledge that I will receive a copy of this consent concurrently upon execution via email to the email address I last provided but may also request a printed copy by calling the office of [facility].    I understand that my insurance will be billed for this visit.   I have read or had this consent read to me. . I understand the contents of this consent, which adequately explains the benefits and risks of the Services being provided via telemedicine.  . I have been provided ample opportunity to ask questions regarding this consent and the Services and have had my questions  answered to my satisfaction. . I give my informed consent for the services to be provided through the use of telemedicine in my medical care  By participating in this telemedicine visit I agree to the above.

## 2018-11-18 NOTE — Progress Notes (Signed)
Complaint:  Visit Type: Patient returns to my office for continued preventative foot care services. Complaint: Patient states" my nails have grown long and thick and become painful to walk and wear shoes" Patient has been diagnosed with DM with no foot complications. The patient presents for preventative foot care services. No changes to ROS  Podiatric Exam: Vascular: dorsalis pedis and posterior tibial pulses are palpable bilateral. Capillary return is immediate. Temperature gradient is WNL. Skin turgor WNL  Sensorium: Normal Semmes Weinstein monofilament test. Normal tactile sensation bilaterally. Nail Exam: Pt has thick disfigured discolored nails with subungual debris noted bilateral entire nail hallux through fifth toenails.  Pincer nails  B/L Ulcer Exam: There is no evidence of ulcer or pre-ulcerative changes or infection. Orthopedic Exam: Muscle tone and strength are WNL. No limitations in general ROM. No crepitus or effusions noted. Foot type and digits show no abnormalities. HAV  B/L Skin: No Porokeratosis. No infection or ulcers.   Diagnosis:  Onychomycosis, , Pain in right toe, pain in left toes  Treatment & Plan Procedures and Treatment: Consent by patient was obtained for treatment procedures. The patient understood the discussion of treatment and procedures well. All questions were answered thoroughly reviewed. Debridement of mycotic and hypertrophic toenails, 1 through 5 bilateral and clearing of subungual debris. No ulceration, no infection noted.  Return Visit-Office Procedure: Patient instructed to return to the office for a follow up visit 3 months for continued evaluation and treatment.    Gardiner Barefoot DPM

## 2018-11-19 DIAGNOSIS — D631 Anemia in chronic kidney disease: Secondary | ICD-10-CM | POA: Diagnosis not present

## 2018-11-19 DIAGNOSIS — N186 End stage renal disease: Secondary | ICD-10-CM | POA: Diagnosis not present

## 2018-11-19 DIAGNOSIS — Z992 Dependence on renal dialysis: Secondary | ICD-10-CM | POA: Diagnosis not present

## 2018-11-19 DIAGNOSIS — D509 Iron deficiency anemia, unspecified: Secondary | ICD-10-CM | POA: Diagnosis not present

## 2018-11-19 DIAGNOSIS — N2581 Secondary hyperparathyroidism of renal origin: Secondary | ICD-10-CM | POA: Diagnosis not present

## 2018-11-21 DIAGNOSIS — D631 Anemia in chronic kidney disease: Secondary | ICD-10-CM | POA: Diagnosis not present

## 2018-11-21 DIAGNOSIS — Z992 Dependence on renal dialysis: Secondary | ICD-10-CM | POA: Diagnosis not present

## 2018-11-21 DIAGNOSIS — D509 Iron deficiency anemia, unspecified: Secondary | ICD-10-CM | POA: Diagnosis not present

## 2018-11-21 DIAGNOSIS — N2581 Secondary hyperparathyroidism of renal origin: Secondary | ICD-10-CM | POA: Diagnosis not present

## 2018-11-21 DIAGNOSIS — N186 End stage renal disease: Secondary | ICD-10-CM | POA: Diagnosis not present

## 2018-11-22 DIAGNOSIS — D631 Anemia in chronic kidney disease: Secondary | ICD-10-CM | POA: Diagnosis not present

## 2018-11-22 DIAGNOSIS — N2581 Secondary hyperparathyroidism of renal origin: Secondary | ICD-10-CM | POA: Diagnosis not present

## 2018-11-22 DIAGNOSIS — Z992 Dependence on renal dialysis: Secondary | ICD-10-CM | POA: Diagnosis not present

## 2018-11-22 DIAGNOSIS — N186 End stage renal disease: Secondary | ICD-10-CM | POA: Diagnosis not present

## 2018-11-22 DIAGNOSIS — D509 Iron deficiency anemia, unspecified: Secondary | ICD-10-CM | POA: Diagnosis not present

## 2018-11-24 DIAGNOSIS — Z01818 Encounter for other preprocedural examination: Secondary | ICD-10-CM

## 2018-11-24 DIAGNOSIS — N186 End stage renal disease: Secondary | ICD-10-CM

## 2018-11-24 DIAGNOSIS — Z7682 Awaiting organ transplant status: Principal | ICD-10-CM

## 2018-11-24 DIAGNOSIS — Z992 Dependence on renal dialysis: Secondary | ICD-10-CM | POA: Diagnosis not present

## 2018-11-24 DIAGNOSIS — D509 Iron deficiency anemia, unspecified: Secondary | ICD-10-CM | POA: Diagnosis not present

## 2018-11-24 DIAGNOSIS — D631 Anemia in chronic kidney disease: Secondary | ICD-10-CM | POA: Diagnosis not present

## 2018-11-24 DIAGNOSIS — N2581 Secondary hyperparathyroidism of renal origin: Secondary | ICD-10-CM | POA: Diagnosis not present

## 2018-11-26 DIAGNOSIS — D631 Anemia in chronic kidney disease: Secondary | ICD-10-CM | POA: Diagnosis not present

## 2018-11-26 DIAGNOSIS — N186 End stage renal disease: Secondary | ICD-10-CM | POA: Diagnosis not present

## 2018-11-26 DIAGNOSIS — Z992 Dependence on renal dialysis: Secondary | ICD-10-CM | POA: Diagnosis not present

## 2018-11-26 DIAGNOSIS — N2581 Secondary hyperparathyroidism of renal origin: Secondary | ICD-10-CM | POA: Diagnosis not present

## 2018-11-26 DIAGNOSIS — D509 Iron deficiency anemia, unspecified: Secondary | ICD-10-CM | POA: Diagnosis not present

## 2018-11-29 DIAGNOSIS — Z992 Dependence on renal dialysis: Secondary | ICD-10-CM | POA: Diagnosis not present

## 2018-11-29 DIAGNOSIS — D631 Anemia in chronic kidney disease: Secondary | ICD-10-CM | POA: Diagnosis not present

## 2018-11-29 DIAGNOSIS — D509 Iron deficiency anemia, unspecified: Secondary | ICD-10-CM | POA: Diagnosis not present

## 2018-11-29 DIAGNOSIS — N186 End stage renal disease: Secondary | ICD-10-CM | POA: Diagnosis not present

## 2018-11-29 DIAGNOSIS — N2581 Secondary hyperparathyroidism of renal origin: Secondary | ICD-10-CM | POA: Diagnosis not present

## 2018-11-30 ENCOUNTER — Telehealth: Payer: Self-pay

## 2018-11-30 NOTE — Telephone Encounter (Signed)
Left a message to contact our office to go over the prescreening prior to her appointment on June 25th with Ignacia Bayley, NP.

## 2018-12-01 DIAGNOSIS — N186 End stage renal disease: Secondary | ICD-10-CM | POA: Diagnosis not present

## 2018-12-01 DIAGNOSIS — Z992 Dependence on renal dialysis: Secondary | ICD-10-CM | POA: Diagnosis not present

## 2018-12-01 DIAGNOSIS — D509 Iron deficiency anemia, unspecified: Secondary | ICD-10-CM | POA: Diagnosis not present

## 2018-12-01 DIAGNOSIS — D631 Anemia in chronic kidney disease: Secondary | ICD-10-CM | POA: Diagnosis not present

## 2018-12-01 DIAGNOSIS — N2581 Secondary hyperparathyroidism of renal origin: Secondary | ICD-10-CM | POA: Diagnosis not present

## 2018-12-01 NOTE — Telephone Encounter (Signed)

## 2018-12-02 ENCOUNTER — Ambulatory Visit (INDEPENDENT_AMBULATORY_CARE_PROVIDER_SITE_OTHER): Payer: Medicare Other | Admitting: Physician Assistant

## 2018-12-02 ENCOUNTER — Encounter: Payer: Self-pay | Admitting: Physician Assistant

## 2018-12-02 VITALS — BP 140/58 | HR 69 | Temp 97.1°F | Ht 67.5 in | Wt 166.5 lb

## 2018-12-02 DIAGNOSIS — I5032 Chronic diastolic (congestive) heart failure: Secondary | ICD-10-CM

## 2018-12-02 DIAGNOSIS — I10 Essential (primary) hypertension: Secondary | ICD-10-CM

## 2018-12-02 DIAGNOSIS — N186 End stage renal disease: Secondary | ICD-10-CM

## 2018-12-02 DIAGNOSIS — I340 Nonrheumatic mitral (valve) insufficiency: Secondary | ICD-10-CM

## 2018-12-02 DIAGNOSIS — E785 Hyperlipidemia, unspecified: Secondary | ICD-10-CM

## 2018-12-02 DIAGNOSIS — I2510 Atherosclerotic heart disease of native coronary artery without angina pectoris: Secondary | ICD-10-CM

## 2018-12-02 MED ORDER — ISOSORBIDE MONONITRATE ER 60 MG PO TB24: 60.0000 mg | ORAL_TABLET | Freq: Every day | ORAL | 11 refills | Status: DC

## 2018-12-02 MED ORDER — FUROSEMIDE 80 MG PO TABS: 80.0000 mg | ORAL_TABLET | ORAL | 3 refills | Status: DC

## 2018-12-02 MED ORDER — NITROGLYCERIN 0.4 MG SL SUBL: 0.4000 mg | SUBLINGUAL_TABLET | SUBLINGUAL | 3 refills | Status: DC | PRN

## 2018-12-02 MED ORDER — ASPIRIN EC 81 MG PO TBEC: 81.0000 mg | DELAYED_RELEASE_TABLET | Freq: Every day | ORAL | 11 refills | Status: DC

## 2018-12-02 NOTE — Progress Notes (Signed)
Office Visit    Patient Name: Theresa [name] Date of Encounter: 12/02/2018  Primary Care Provider:  McLean-Scocuzza, Nino Glow, MD Primary Cardiologist:  Ida Rogue, MD  Chief Complaint    63 year old female with history of CAD s/p RCA stenting, hypertension, hyperlipidemia, ESRD on HD, remote tobacco abuse, COPD, peripheral vascular disease, carotid arterial disease, diabetes, and sickle cell trait, who presented for follow-up related to moderate mitral valve regurgitation.  Past Medical History    Past Medical History:  Diagnosis Date   Anemia of chronic disease    Anginal pain ([place])    Carotid arterial disease ([place])    a. 02/2013 U/S: 40-59% bilat ICA stenosis.   Chronic constipation    Chronic diastolic CHF (congestive heart failure) ([place])    a. 10/2013 Echo [facility]): EF 55-60%, mod conc LVH, mod MR, mildly dil LA, mild Ao sclerosis w/o stenosis; b. 02/2016 Echo: EF 55-60%, no rwma, Gr DD, mild AS, mod to sev MR, mildly dil LA, PASP 92mmHg; c. 07/2017 Echo [facility]): EF >55%, mild to mod LVH, Gr2 DD, Ao scl, Mod MR, mod dil LA, nl RV fxn.   Colon polyps    COPD (chronic obstructive pulmonary disease) (HCC)    Coronary artery disease    a. 05/2013 NSTEMI/PCI: LM 20d, LAD min irregs, LCX small, nl, OM1 nl, RCA dom 36m (2.5x16 Promus DES), PDA1 80p; b. 04/2015 Cath: LM nl, LAD 35m, D1/2 min irregs, LCX 35p/m, OM2/3 min irregs, RCA patent mid stent, RPDA 70ost, RPLB1 30, RPLB2/3 min irregs, EF 55-65%--> Med Rx; c. 06/2017 [facility]): No ischemia/infarct, EF 64%.   Diabetes mellitus    Diabetic neuropathy ([place])    Diabetic retinopathy ([place]) 05/28/2013   Hx bilat retinal detachment, proliferative diab retinopathy and bilat vitreous hemorrhage    Emphysema    ESRD on hemodialysis ([place])    a. [facility] in [place], [place] [phone]/Dr. [name], on a MWF schedule.  She started dialysis in Feb 2014.  Etiology of renal failure not known, likely diabetes.  Has a left upper arm AV graft.    History of bronchitis    Mar 2012   History of pneumonia    June 2012   History of tobacco abuse    a. Quit 2012.   Hyperlipidemia    Hypertension    Moderate to severe mitral insufficiency    a. 10/2013 Echo: EF 55-60%, mod MR; b. 02/2016 Echo: EF 55-60%, mod to sev MR directed posteriorly--felt to be dynamic -worse with volume overload; c. 07/2017 Echo [facility]): EF >55%. Mod MR.   Myocardial infarct [facility]) 05/2013   Peripheral vascular disease ([place])    Sickle cell trait ([place])    Thyroid nodule    [place] 06/2017 due to f/u US in 1 year    Past Surgical History:  Procedure Laterality Date   A/V FISTULAGRAM Left 11/10/2017   Procedure: A/V FISTULAGRAM;  Surgeon: Katha Cabal, MD;  Location: [place] CV LAB;  Service: Cardiovascular;  Laterality: Left;   ABDOMINAL HYSTERECTOMY     2000   CARDIAC CATHETERIZATION     CARDIAC CATHETERIZATION N/A 05/02/2015   Procedure: Left Heart Cath and Coronary Angiography;  Surgeon: [name], MD;  Location: [place] CV LAB;  Service: Cardiovascular;  Laterality: N/A;   COLONOSCOPY WITH PROPOFOL N/A 07/08/2016   Procedure: COLONOSCOPY WITH PROPOFOL;  Surgeon: [name], MD;  Location: [facility] ENDOSCOPY;  Service: Endoscopy;  Laterality: N/A;   COLONOSCOPY WITH PROPOFOL N/A 08/20/2017  Procedure: COLONOSCOPY WITH PROPOFOL;  Surgeon: [name], MD;  Location: [facility] ENDOSCOPY;  Service: Gastroenterology;  Laterality: N/A;   colonscopy     CORONARY ANGIOPLASTY  05/28/2014   stent placement to the mid RCA   [place]     several in the early 80's   ESOPHAGOGASTRODUODENOSCOPY     2012   ESOPHAGOGASTRODUODENOSCOPY (EGD) WITH PROPOFOL N/A 03/11/2018   Procedure: ESOPHAGOGASTRODUODENOSCOPY (EGD) WITH PROPOFOL;  Surgeon: [name], MD;  Location: [facility] ENDOSCOPY;  Service: Gastroenterology;  Laterality: N/A;   EYE SURGERY     bilateral laser 2012   EYE SURGERY     right   EYE SURGERY     x4 both  eyes   GAS INSERTION  09/30/2011   Procedure: INSERTION OF GAS;  Surgeon: [name], MD;  Location: [place];  Service: Ophthalmology;  Laterality: Right;  [id number]   GAS/FLUID EXCHANGE  09/30/2011   Procedure: GAS/FLUID EXCHANGE;  Surgeon: [name], MD;  Location: [place];  Service: Ophthalmology;  Laterality: Right;   LEFT HEART CATHETERIZATION WITH CORONARY ANGIOGRAM N/A 05/28/2013   Procedure: LEFT HEART CATHETERIZATION WITH CORONARY ANGIOGRAM;  Surgeon: [name], MD;  Location: [facility] CATH LAB;  Service: Cardiovascular;  Laterality: N/A;   PARS PLANA VITRECTOMY  04/22/2011   Procedure: PARS PLANA VITRECTOMY WITH 25 GAUGE;  Surgeon: [name], MD;  Location: [place];  Service: Ophthalmology;  Laterality: Left;  membrane peel, endolaser, gas fluid exchange, silicone oil, repair of complex traction retinal detachment   PARS PLANA VITRECTOMY  09/30/2011   Procedure: PARS PLANA VITRECTOMY WITH 25 GAUGE;  Surgeon: [name], MD;  Location: [place];  Service: Ophthalmology;  Laterality: Right;  Endolaser; Repair of Complex Traction Retinal Detachment   PARS PLANA VITRECTOMY  02/24/2012   Procedure: PARS PLANA VITRECTOMY WITH 25 GAUGE;  Surgeon: [name], MD;  Location: [place];  Service: Ophthalmology;  Laterality: Left;   PTCA     SILICON OIL REMOVAL  1/66/0630   Procedure: SILICON OIL REMOVAL;  Surgeon: [name], MD;  Location: [place];  Service: Ophthalmology;  Laterality: Left;   THROMBECTOMY / ARTERIOVENOUS GRAFT REVISION     TUBAL LIGATION     1979    Allergies  Allergies  Allergen Reactions   Hydrocodone Hives   Metformin Diarrhea and Other (See Comments)    Other reaction(s): Distress (finding)   Lisinopril     Unknown reaction     History of Present Illness    63 year old female with complex PMH as above including ESRD with HD on Monday, Wednesday, and Friday, CAD s/p RCA stenting, hypertension, hyperlipidemia, DM 2, moderate mitral valve  regurgitation, remote tobacco abuse, COPD, sickle cell trait, peripheral vascular and carotid disease, and half path.  In 05/2013, she suffered a non-STEMI and underwent DES placement to the RCA.  Most recent catheterization was 04/2015 after an abnormal stress test and showed patent RCA stent without obstructive disease.  07/2017 echo at [facility] showed stable, moderate mitral regurgitation and aortic sclerosis with normal LV function.  She was last seen in the clinic 02/2018, at which time she was feeling well but had elevated blood pressure with metoprolol subsequently increased to 18.75 mg by mouth twice daily.  Since then, she reports doing very well.  She denies chest pain, palpitations, racing heart rate.  No reported lower extremity edema.  She does report some abdominal swelling; however, she was recently placed on something to  increase her appetite and feels this may be the culprit for her increase in abdominal girth.  She continues to try to watch her salt intake and remain active.  She denies shortness of breath or dyspnea on exertion; however, she did note that nephrology has referenced volume increase, specifically stating "fluid on my right lung that they keep trying to get off with dialysis but keeps coming back."  She has been tolerating Monday/Wednesday/Friday dialysis well.  She takes her blood pressure at home and twice daily.  She noted it typically ranges from SBP high 130s -140s.  She weighs herself daily with weights running from 166 -168 pounds. Last dry weight 07/2018 recorded as 161lbs. She reported that she ran out of her Lasix, Imdur, ASA, and nitro and thus has not taken them for some time; however, she was unable to specify the length of time that she has been out of these medications.  She reported that nephrology did not stop these medications but rather she ran out of them.  She was originally scheduled to have an annual echocardiogram completed at [facility]; however, this was canceled due to  COVID-19 precautions, he never received a call to reschedule it.  She indicated that she would like to follow-up with [facility] for her annual echo at this time.  Home Medications    Prior to Admission medications   Medication Sig Start Date End Date Taking? Authorizing Provider  albuterol (PROVENTIL) (2.5 MG/3ML) 0.083% nebulizer solution Take 3 mLs (2.5 mg total) by nebulization every 6 (six) hours as needed for wheezing or shortness of breath. 09/23/18  Yes McLean-Scocuzza, Nino Glow, MD  albuterol (VENTOLIN HFA) 108 (90 Base) MCG/ACT inhaler Inhale 1-2 puffs into the lungs every 6 (six) hours as needed for wheezing or shortness of breath. 09/23/18  Yes McLean-Scocuzza, Nino Glow, MD  amLODipine (NORVASC) 10 MG tablet Take 10 mg by mouth daily.  09/17/16  Yes [provider]  aspirin EC 81 MG tablet Take 81 mg by mouth at bedtime.    Yes [provider]  atorvastatin (LIPITOR) 40 MG tablet Take 1 tablet (40 mg total) by mouth daily. At night 01/05/18  Yes McLean-Scocuzza, Nino Glow, MD  AURYXIA 1 GM 210 MG(Fe) tablet Take 420-630 mg by mouth 3 (three) times daily with meals. Take 630 mg with each meal and 420 mg with each snack 09/17/17  Yes [provider]  carvedilol (COREG) 12.5 MG tablet Take 1.5 tablets (18.75 mg) by mouth twice daily 02/16/18  Yes Theora Gianotti, NP  cetirizine (ZYRTEC) 5 MG tablet Take 1 tablet (5 mg total) by mouth daily as needed for allergies. 09/23/18  Yes McLean-Scocuzza, Nino Glow, MD  cinacalcet (SENSIPAR) 60 MG tablet Take 60 mg by mouth daily.   Yes [provider]  cloNIDine (CATAPRES) 0.1 MG tablet Take 0.1 mg by mouth 2 (two) times daily.   Yes [provider]  furosemide (LASIX) 80 MG tablet Take 80 mg by mouth See admin instructions. Take 80 mg by mouth once daily on Sun, Tue, Thur, and Sat (non dialysis days)   Yes [provider]  isosorbide mononitrate (IMDUR) 60 MG 24 hr tablet Take 1 tablet (60 mg total) by  mouth daily. 07/28/17  Yes Max Sane, MD  losartan (COZAAR) 50 MG tablet Take by mouth. 12/12/17  Yes [provider]  megestrol (MEGACE) 20 MG tablet Take 20 mg by mouth daily. 06/10/18  Yes [provider]  meloxicam (MOBIC) 7.5 MG tablet meloxicam  7.5 mg tablet   Yes [provider]  multivitamin (RENA-VIT) TABS tablet Take 1 tablet by mouth daily.   Yes [provider]  neomycin-polymyxin b-dexamethasone (MAXITROL) 3.5-10000-0.1 SUSP  08/24/18  Yes [provider]  nitroGLYCERIN (NITROSTAT) 0.4 MG SL tablet Place 1 tablet (0.4 mg total) under the tongue every 5 (five) minutes as needed for chest pain. 01/15/17  Yes Burnard Hawthorne, FNP  ondansetron (ZOFRAN) 4 MG tablet Take 1 tablet (4 mg total) by mouth every 8 (eight) hours as needed for nausea or vomiting. 01/05/18  Yes McLean-Scocuzza, Nino Glow, MD  oxyCODONE-acetaminophen (PERCOCET/ROXICET) 5-325 MG tablet Take 1 tablet by mouth 2 (two) times daily as needed for pain.  10/04/17  Yes [provider]  pregabalin (LYRICA) 75 MG capsule Take 1 capsule (75 mg total) by mouth daily. 04/21/16  Yes Arnett, Yvetta Coder, FNP  rOPINIRole (REQUIP) 0.25 MG tablet Take 0.25 mg by mouth 3 (three) times daily. 08/19/18  Yes [provider]  umeclidinium-vilanterol (ANORO ELLIPTA) 62.5-25 MCG/INH AEPB Inhale 1 puff into the lungs daily. 09/23/18  Yes McLean-Scocuzza, Nino Glow, MD  UNABLE TO FIND TEST BLOOD SUGARS 2 TIMES DAILY 09/26/13  Yes [provider]  UNABLE TO FIND calcium acetate(phosphate binders) 667 mg capsule   Yes [provider]    Review of Systems    She denies chest pain, palpitations, dyspnea, pnd, orthopnea, n, v, dizziness, syncope, edema, weight gain, or early satiety.  All other systems reviewed and are otherwise negative except as noted above.  Physical Exam    VS:  BP (!) 140/58 (BP Location: Left Arm, Patient Position: Sitting, Cuff Size: Normal)    Pulse 69     Temp (!) 97.1 F (36.2 C)    Ht 5' 7.5" (1.715 m)    Wt 166 lb 8 oz (75.5 kg)    SpO2 95%    BMI 25.69 kg/m  , BMI Body mass index is 25.69 kg/m. GEN: Well nourished, well developed, in no acute distress. HEENT: normal. Neck: Supple, JVD ~9cm, no bruits, no masses. Cardiac: RRR, 2/6 systolic murmur and loudest at apex radiating to L axilla, 2/6 systolic murmur of upper L sternal border. No rubs, or gallops. No clubbing, cyanosis, edema.  Radials/DP/PT 1+ and equal bilaterally.  Respiratory:  Respirations regular and unlabored, R sided rhonchi appreciated and not cleared entirely with coughing but otherwise CTAB. GI: Firm, nontender, slightly distended, BS + x 4. MS: no deformity or atrophy.  No lower extremity edema. Skin: warm and dry, no rash. Neuro:  Strength and sensation are intact. Psych: Normal affect.  Accessory Clinical Findings    ECG personally reviewed by me today -NSR, 2 9 bpm, LVH, left atrial enlargement, prolonged QT- no acute changes.  Assessment & Plan    Moderate mitral regurgitation - Patient indicated that she prefers to have her annual echocardiogram completed at [facility], given her previously scheduled echocardiogram at [facility] was deferred due to COVID-19 precautions.  Last echocardiogram was in February 2019 and showed stable, moderate mitral regurgitation with normal LV function.  No symptoms of shortness of breath or dyspnea on exertion at that time. Will obtain echo for this year and resume annual echocardiograms through our office going forward.   CAD -Status post prior RCA stenting with annual stress test completed at [facility] since that time with most recent 06/2017 stress test ruled low risk.  No chest pain since that time.  Will defer annual stress testing to [facility] at this  time, given she has not had any chest pain since her last stress test.  Will refill ASA, Imdur, and PRN SL nitro, which she stated she has not taken for some time as she ran out of these medications and  did not have refills for them.  Continue ASA, losartan 50 mg daily, carvedilol 18.75 mg twice daily, Lipitor 40 mg daily, and nitrate therapy.  HFpEF -Grade 2 diastolic dysfunction per 10/5206 echocardiogram with updated echocardiogram pending and to be completed as above.  She has been out of her Lasix and we will refill this medication today. Volume management has been handled by nephrology / HD and will defer follow-up BMET s/p refill / restart of Lasix to nephrology. Blood pressure improved since last office visit with increased dose of BB.   Essential hypertension - Improved BP since last office visit from 178/66  140/58 with increased dose of carvedilol to 18.75mg  BID.  Continue with amlodipine, clonidine, isosorbide. As above, we refill her Lasix with follow-up BMET to be obtained per nephrology.  HLD - Continue statin therapy  End-stage renal disease - HD Monday, Wednesday, and Friday.  Tolerating well.  Per nephrology.  PVD -Followed by vascular surgery.  Denies claudication.  Disposition: Follow-up 3 months or sooner if necessary following echocardiogram and if needed following restart/ refill of medications as above.   Arvil Chaco, PA-C 12/02/2018, 1:43 PM

## 2018-12-02 NOTE — Patient Instructions (Signed)
Medication Instructions:  Your physician recommends that you continue on your current medications as directed. Please refer to the Current Medication list given to you today. If you need a refill on your cardiac medications before your next appointment, please call your pharmacy.   Lab work: None ordered  If you have labs (blood work) drawn today and your tests are completely normal, you will receive your results only by: Marland Kitchen MyChart Message (if you have MyChart) OR . A paper copy in the mail If you have any lab test that is abnormal or we need to change your treatment, we will call you to review the results.  Testing/Procedures: Echo  Please return to [facility] on ______________ at _______________ AM/PM for an Echocardiogram. Your physician has requested that you have an echocardiogram. Echocardiography is a painless test that uses sound waves to create images of your heart. It provides your doctor with information about the size and shape of your heart and how well your heart's chambers and valves are working. This procedure takes approximately one hour. There are no restrictions for this procedure. Please note; depending on visual quality an IV may need to be placed.    Follow-Up: At [facility], you and your health needs are our priority.  As part of our continuing mission to provide you with exceptional heart care, we have created designated Provider Care Teams.  These Care Teams include your primary Cardiologist (physician) and Advanced Practice Providers (APPs -  Physician Assistants and Nurse Practitioners) who all work together to provide you with the care you need, when you need it. You will need a follow up appointment in 3 months.   You may see Ida Rogue, MD or Marrianne Mood, PA-C.

## 2018-12-03 DIAGNOSIS — D631 Anemia in chronic kidney disease: Secondary | ICD-10-CM | POA: Diagnosis not present

## 2018-12-03 DIAGNOSIS — Z992 Dependence on renal dialysis: Secondary | ICD-10-CM | POA: Diagnosis not present

## 2018-12-03 DIAGNOSIS — N186 End stage renal disease: Secondary | ICD-10-CM | POA: Diagnosis not present

## 2018-12-03 DIAGNOSIS — D509 Iron deficiency anemia, unspecified: Secondary | ICD-10-CM | POA: Diagnosis not present

## 2018-12-03 DIAGNOSIS — N2581 Secondary hyperparathyroidism of renal origin: Secondary | ICD-10-CM | POA: Diagnosis not present

## 2018-12-06 DIAGNOSIS — D631 Anemia in chronic kidney disease: Secondary | ICD-10-CM | POA: Diagnosis not present

## 2018-12-06 DIAGNOSIS — N2581 Secondary hyperparathyroidism of renal origin: Secondary | ICD-10-CM | POA: Diagnosis not present

## 2018-12-06 DIAGNOSIS — D509 Iron deficiency anemia, unspecified: Secondary | ICD-10-CM | POA: Diagnosis not present

## 2018-12-06 DIAGNOSIS — N186 End stage renal disease: Secondary | ICD-10-CM | POA: Diagnosis not present

## 2018-12-06 DIAGNOSIS — Z992 Dependence on renal dialysis: Secondary | ICD-10-CM | POA: Diagnosis not present

## 2018-12-07 DIAGNOSIS — N186 End stage renal disease: Secondary | ICD-10-CM | POA: Diagnosis not present

## 2018-12-07 DIAGNOSIS — Z992 Dependence on renal dialysis: Secondary | ICD-10-CM | POA: Diagnosis not present

## 2018-12-08 DIAGNOSIS — N2581 Secondary hyperparathyroidism of renal origin: Secondary | ICD-10-CM | POA: Diagnosis not present

## 2018-12-08 DIAGNOSIS — D509 Iron deficiency anemia, unspecified: Secondary | ICD-10-CM | POA: Diagnosis not present

## 2018-12-08 DIAGNOSIS — D631 Anemia in chronic kidney disease: Secondary | ICD-10-CM | POA: Diagnosis not present

## 2018-12-08 DIAGNOSIS — Z992 Dependence on renal dialysis: Secondary | ICD-10-CM | POA: Diagnosis not present

## 2018-12-08 DIAGNOSIS — N186 End stage renal disease: Secondary | ICD-10-CM | POA: Diagnosis not present

## 2018-12-10 DIAGNOSIS — N2581 Secondary hyperparathyroidism of renal origin: Secondary | ICD-10-CM | POA: Diagnosis not present

## 2018-12-10 DIAGNOSIS — N186 End stage renal disease: Secondary | ICD-10-CM | POA: Diagnosis not present

## 2018-12-10 DIAGNOSIS — D631 Anemia in chronic kidney disease: Secondary | ICD-10-CM | POA: Diagnosis not present

## 2018-12-10 DIAGNOSIS — Z992 Dependence on renal dialysis: Secondary | ICD-10-CM | POA: Diagnosis not present

## 2018-12-10 DIAGNOSIS — D509 Iron deficiency anemia, unspecified: Secondary | ICD-10-CM | POA: Diagnosis not present

## 2018-12-13 ENCOUNTER — Encounter: Admit: 2018-12-13 | Discharge: 2018-12-13 | Payer: MEDICARE | Attending: Nephrology | Primary: Nephrology

## 2018-12-13 DIAGNOSIS — N186 End stage renal disease: Secondary | ICD-10-CM | POA: Diagnosis not present

## 2018-12-13 DIAGNOSIS — N2581 Secondary hyperparathyroidism of renal origin: Secondary | ICD-10-CM | POA: Diagnosis not present

## 2018-12-13 DIAGNOSIS — D509 Iron deficiency anemia, unspecified: Secondary | ICD-10-CM | POA: Diagnosis not present

## 2018-12-13 DIAGNOSIS — D631 Anemia in chronic kidney disease: Secondary | ICD-10-CM | POA: Diagnosis not present

## 2018-12-13 DIAGNOSIS — Z992 Dependence on renal dialysis: Secondary | ICD-10-CM | POA: Diagnosis not present

## 2018-12-15 DIAGNOSIS — Z992 Dependence on renal dialysis: Secondary | ICD-10-CM | POA: Diagnosis not present

## 2018-12-15 DIAGNOSIS — N186 End stage renal disease: Secondary | ICD-10-CM | POA: Diagnosis not present

## 2018-12-15 DIAGNOSIS — D509 Iron deficiency anemia, unspecified: Secondary | ICD-10-CM | POA: Diagnosis not present

## 2018-12-15 DIAGNOSIS — D631 Anemia in chronic kidney disease: Secondary | ICD-10-CM | POA: Diagnosis not present

## 2018-12-15 DIAGNOSIS — N2581 Secondary hyperparathyroidism of renal origin: Secondary | ICD-10-CM | POA: Diagnosis not present

## 2018-12-17 DIAGNOSIS — Z7682 Awaiting organ transplant status: Principal | ICD-10-CM

## 2018-12-17 DIAGNOSIS — N186 End stage renal disease: Secondary | ICD-10-CM

## 2018-12-17 DIAGNOSIS — Z01818 Encounter for other preprocedural examination: Secondary | ICD-10-CM

## 2018-12-17 DIAGNOSIS — D509 Iron deficiency anemia, unspecified: Secondary | ICD-10-CM | POA: Diagnosis not present

## 2018-12-17 DIAGNOSIS — D631 Anemia in chronic kidney disease: Secondary | ICD-10-CM | POA: Diagnosis not present

## 2018-12-17 DIAGNOSIS — N2581 Secondary hyperparathyroidism of renal origin: Secondary | ICD-10-CM | POA: Diagnosis not present

## 2018-12-17 DIAGNOSIS — Z992 Dependence on renal dialysis: Secondary | ICD-10-CM | POA: Diagnosis not present

## 2018-12-20 DIAGNOSIS — N186 End stage renal disease: Secondary | ICD-10-CM | POA: Diagnosis not present

## 2018-12-20 DIAGNOSIS — N2581 Secondary hyperparathyroidism of renal origin: Secondary | ICD-10-CM | POA: Diagnosis not present

## 2018-12-20 DIAGNOSIS — Z992 Dependence on renal dialysis: Secondary | ICD-10-CM | POA: Diagnosis not present

## 2018-12-20 DIAGNOSIS — D631 Anemia in chronic kidney disease: Secondary | ICD-10-CM | POA: Diagnosis not present

## 2018-12-20 DIAGNOSIS — D509 Iron deficiency anemia, unspecified: Secondary | ICD-10-CM | POA: Diagnosis not present

## 2018-12-21 DIAGNOSIS — D631 Anemia in chronic kidney disease: Secondary | ICD-10-CM | POA: Diagnosis not present

## 2018-12-21 DIAGNOSIS — Z992 Dependence on renal dialysis: Secondary | ICD-10-CM | POA: Diagnosis not present

## 2018-12-21 DIAGNOSIS — E11319 Type 2 diabetes mellitus with unspecified diabetic retinopathy without macular edema: Secondary | ICD-10-CM | POA: Diagnosis not present

## 2018-12-21 DIAGNOSIS — D509 Iron deficiency anemia, unspecified: Secondary | ICD-10-CM | POA: Diagnosis not present

## 2018-12-21 DIAGNOSIS — N2581 Secondary hyperparathyroidism of renal origin: Secondary | ICD-10-CM | POA: Diagnosis not present

## 2018-12-21 DIAGNOSIS — N186 End stage renal disease: Secondary | ICD-10-CM | POA: Diagnosis not present

## 2018-12-21 LAB — HM DIABETES EYE EXAM

## 2018-12-22 ENCOUNTER — Encounter: Payer: Self-pay | Admitting: Internal Medicine

## 2018-12-22 DIAGNOSIS — D509 Iron deficiency anemia, unspecified: Secondary | ICD-10-CM | POA: Diagnosis not present

## 2018-12-22 DIAGNOSIS — E119 Type 2 diabetes mellitus without complications: Secondary | ICD-10-CM | POA: Diagnosis not present

## 2018-12-22 DIAGNOSIS — N186 End stage renal disease: Secondary | ICD-10-CM | POA: Diagnosis not present

## 2018-12-22 DIAGNOSIS — N2581 Secondary hyperparathyroidism of renal origin: Secondary | ICD-10-CM | POA: Diagnosis not present

## 2018-12-22 DIAGNOSIS — Z992 Dependence on renal dialysis: Secondary | ICD-10-CM | POA: Diagnosis not present

## 2018-12-22 DIAGNOSIS — D631 Anemia in chronic kidney disease: Secondary | ICD-10-CM | POA: Diagnosis not present

## 2018-12-24 DIAGNOSIS — Z992 Dependence on renal dialysis: Secondary | ICD-10-CM | POA: Diagnosis not present

## 2018-12-24 DIAGNOSIS — D631 Anemia in chronic kidney disease: Secondary | ICD-10-CM | POA: Diagnosis not present

## 2018-12-24 DIAGNOSIS — N186 End stage renal disease: Secondary | ICD-10-CM | POA: Diagnosis not present

## 2018-12-24 DIAGNOSIS — D509 Iron deficiency anemia, unspecified: Secondary | ICD-10-CM | POA: Diagnosis not present

## 2018-12-24 DIAGNOSIS — N2581 Secondary hyperparathyroidism of renal origin: Secondary | ICD-10-CM | POA: Diagnosis not present

## 2018-12-27 DIAGNOSIS — D631 Anemia in chronic kidney disease: Secondary | ICD-10-CM | POA: Diagnosis not present

## 2018-12-27 DIAGNOSIS — D509 Iron deficiency anemia, unspecified: Secondary | ICD-10-CM | POA: Diagnosis not present

## 2018-12-27 DIAGNOSIS — N2581 Secondary hyperparathyroidism of renal origin: Secondary | ICD-10-CM | POA: Diagnosis not present

## 2018-12-27 DIAGNOSIS — Z992 Dependence on renal dialysis: Secondary | ICD-10-CM | POA: Diagnosis not present

## 2018-12-27 DIAGNOSIS — N186 End stage renal disease: Secondary | ICD-10-CM | POA: Diagnosis not present

## 2018-12-29 ENCOUNTER — Telehealth: Payer: Self-pay

## 2018-12-29 DIAGNOSIS — Z992 Dependence on renal dialysis: Secondary | ICD-10-CM | POA: Diagnosis not present

## 2018-12-29 DIAGNOSIS — N186 End stage renal disease: Secondary | ICD-10-CM | POA: Diagnosis not present

## 2018-12-29 DIAGNOSIS — D509 Iron deficiency anemia, unspecified: Secondary | ICD-10-CM | POA: Diagnosis not present

## 2018-12-29 DIAGNOSIS — D631 Anemia in chronic kidney disease: Secondary | ICD-10-CM | POA: Diagnosis not present

## 2018-12-29 DIAGNOSIS — N2581 Secondary hyperparathyroidism of renal origin: Secondary | ICD-10-CM | POA: Diagnosis not present

## 2018-12-29 NOTE — Telephone Encounter (Signed)

## 2018-12-30 ENCOUNTER — Ambulatory Visit (INDEPENDENT_AMBULATORY_CARE_PROVIDER_SITE_OTHER): Payer: Medicare Other

## 2018-12-30 ENCOUNTER — Other Ambulatory Visit: Payer: Self-pay

## 2018-12-30 DIAGNOSIS — I5032 Chronic diastolic (congestive) heart failure: Secondary | ICD-10-CM | POA: Diagnosis not present

## 2018-12-31 DIAGNOSIS — Z992 Dependence on renal dialysis: Secondary | ICD-10-CM | POA: Diagnosis not present

## 2018-12-31 DIAGNOSIS — D631 Anemia in chronic kidney disease: Secondary | ICD-10-CM | POA: Diagnosis not present

## 2018-12-31 DIAGNOSIS — D509 Iron deficiency anemia, unspecified: Secondary | ICD-10-CM | POA: Diagnosis not present

## 2018-12-31 DIAGNOSIS — N2581 Secondary hyperparathyroidism of renal origin: Secondary | ICD-10-CM | POA: Diagnosis not present

## 2018-12-31 DIAGNOSIS — N186 End stage renal disease: Secondary | ICD-10-CM | POA: Diagnosis not present

## 2019-01-03 DIAGNOSIS — N2581 Secondary hyperparathyroidism of renal origin: Secondary | ICD-10-CM | POA: Diagnosis not present

## 2019-01-03 DIAGNOSIS — D509 Iron deficiency anemia, unspecified: Secondary | ICD-10-CM | POA: Diagnosis not present

## 2019-01-03 DIAGNOSIS — Z992 Dependence on renal dialysis: Secondary | ICD-10-CM | POA: Diagnosis not present

## 2019-01-03 DIAGNOSIS — D631 Anemia in chronic kidney disease: Secondary | ICD-10-CM | POA: Diagnosis not present

## 2019-01-03 DIAGNOSIS — N186 End stage renal disease: Secondary | ICD-10-CM | POA: Diagnosis not present

## 2019-01-05 DIAGNOSIS — Z992 Dependence on renal dialysis: Secondary | ICD-10-CM | POA: Diagnosis not present

## 2019-01-05 DIAGNOSIS — N2581 Secondary hyperparathyroidism of renal origin: Secondary | ICD-10-CM | POA: Diagnosis not present

## 2019-01-05 DIAGNOSIS — D631 Anemia in chronic kidney disease: Secondary | ICD-10-CM | POA: Diagnosis not present

## 2019-01-05 DIAGNOSIS — D509 Iron deficiency anemia, unspecified: Secondary | ICD-10-CM | POA: Diagnosis not present

## 2019-01-05 DIAGNOSIS — N186 End stage renal disease: Secondary | ICD-10-CM | POA: Diagnosis not present

## 2019-01-07 DIAGNOSIS — N2581 Secondary hyperparathyroidism of renal origin: Secondary | ICD-10-CM | POA: Diagnosis not present

## 2019-01-07 DIAGNOSIS — N186 End stage renal disease: Secondary | ICD-10-CM | POA: Diagnosis not present

## 2019-01-07 DIAGNOSIS — Z992 Dependence on renal dialysis: Secondary | ICD-10-CM | POA: Diagnosis not present

## 2019-01-07 DIAGNOSIS — D509 Iron deficiency anemia, unspecified: Secondary | ICD-10-CM | POA: Diagnosis not present

## 2019-01-07 DIAGNOSIS — D631 Anemia in chronic kidney disease: Secondary | ICD-10-CM | POA: Diagnosis not present

## 2019-01-08 DIAGNOSIS — N186 End stage renal disease: Secondary | ICD-10-CM | POA: Diagnosis not present

## 2019-01-08 DIAGNOSIS — N2581 Secondary hyperparathyroidism of renal origin: Secondary | ICD-10-CM | POA: Diagnosis not present

## 2019-01-08 DIAGNOSIS — D631 Anemia in chronic kidney disease: Secondary | ICD-10-CM | POA: Diagnosis not present

## 2019-01-08 DIAGNOSIS — D509 Iron deficiency anemia, unspecified: Secondary | ICD-10-CM | POA: Diagnosis not present

## 2019-01-08 DIAGNOSIS — Z992 Dependence on renal dialysis: Secondary | ICD-10-CM | POA: Diagnosis not present

## 2019-01-10 ENCOUNTER — Encounter: Admit: 2019-01-10 | Discharge: 2019-01-10 | Payer: MEDICARE | Attending: Nephrology | Primary: Nephrology

## 2019-01-10 DIAGNOSIS — N2581 Secondary hyperparathyroidism of renal origin: Secondary | ICD-10-CM | POA: Diagnosis not present

## 2019-01-10 DIAGNOSIS — N186 End stage renal disease: Secondary | ICD-10-CM | POA: Diagnosis not present

## 2019-01-10 DIAGNOSIS — D509 Iron deficiency anemia, unspecified: Secondary | ICD-10-CM | POA: Diagnosis not present

## 2019-01-10 DIAGNOSIS — D631 Anemia in chronic kidney disease: Secondary | ICD-10-CM | POA: Diagnosis not present

## 2019-01-10 DIAGNOSIS — Z992 Dependence on renal dialysis: Secondary | ICD-10-CM | POA: Diagnosis not present

## 2019-01-12 DIAGNOSIS — D631 Anemia in chronic kidney disease: Secondary | ICD-10-CM | POA: Diagnosis not present

## 2019-01-12 DIAGNOSIS — D509 Iron deficiency anemia, unspecified: Secondary | ICD-10-CM | POA: Diagnosis not present

## 2019-01-12 DIAGNOSIS — Z992 Dependence on renal dialysis: Secondary | ICD-10-CM | POA: Diagnosis not present

## 2019-01-12 DIAGNOSIS — N2581 Secondary hyperparathyroidism of renal origin: Secondary | ICD-10-CM | POA: Diagnosis not present

## 2019-01-12 DIAGNOSIS — N186 End stage renal disease: Secondary | ICD-10-CM | POA: Diagnosis not present

## 2019-01-14 DIAGNOSIS — Z7682 Awaiting organ transplant status: Principal | ICD-10-CM

## 2019-01-14 DIAGNOSIS — Z01818 Encounter for other preprocedural examination: Secondary | ICD-10-CM

## 2019-01-14 DIAGNOSIS — N186 End stage renal disease: Secondary | ICD-10-CM

## 2019-01-14 DIAGNOSIS — Z992 Dependence on renal dialysis: Secondary | ICD-10-CM | POA: Diagnosis not present

## 2019-01-14 DIAGNOSIS — D631 Anemia in chronic kidney disease: Secondary | ICD-10-CM | POA: Diagnosis not present

## 2019-01-14 DIAGNOSIS — N2581 Secondary hyperparathyroidism of renal origin: Secondary | ICD-10-CM | POA: Diagnosis not present

## 2019-01-14 DIAGNOSIS — D509 Iron deficiency anemia, unspecified: Secondary | ICD-10-CM | POA: Diagnosis not present

## 2019-01-17 DIAGNOSIS — N186 End stage renal disease: Secondary | ICD-10-CM | POA: Diagnosis not present

## 2019-01-17 DIAGNOSIS — D509 Iron deficiency anemia, unspecified: Secondary | ICD-10-CM | POA: Diagnosis not present

## 2019-01-17 DIAGNOSIS — D631 Anemia in chronic kidney disease: Secondary | ICD-10-CM | POA: Diagnosis not present

## 2019-01-17 DIAGNOSIS — Z992 Dependence on renal dialysis: Secondary | ICD-10-CM | POA: Diagnosis not present

## 2019-01-17 DIAGNOSIS — N2581 Secondary hyperparathyroidism of renal origin: Secondary | ICD-10-CM | POA: Diagnosis not present

## 2019-01-19 DIAGNOSIS — D509 Iron deficiency anemia, unspecified: Secondary | ICD-10-CM | POA: Diagnosis not present

## 2019-01-19 DIAGNOSIS — D631 Anemia in chronic kidney disease: Secondary | ICD-10-CM | POA: Diagnosis not present

## 2019-01-19 DIAGNOSIS — N186 End stage renal disease: Secondary | ICD-10-CM | POA: Diagnosis not present

## 2019-01-19 DIAGNOSIS — N2581 Secondary hyperparathyroidism of renal origin: Secondary | ICD-10-CM | POA: Diagnosis not present

## 2019-01-19 DIAGNOSIS — Z992 Dependence on renal dialysis: Secondary | ICD-10-CM | POA: Diagnosis not present

## 2019-01-20 DIAGNOSIS — D509 Iron deficiency anemia, unspecified: Secondary | ICD-10-CM | POA: Diagnosis not present

## 2019-01-20 DIAGNOSIS — Z992 Dependence on renal dialysis: Secondary | ICD-10-CM | POA: Diagnosis not present

## 2019-01-20 DIAGNOSIS — N2581 Secondary hyperparathyroidism of renal origin: Secondary | ICD-10-CM | POA: Diagnosis not present

## 2019-01-20 DIAGNOSIS — D631 Anemia in chronic kidney disease: Secondary | ICD-10-CM | POA: Diagnosis not present

## 2019-01-20 DIAGNOSIS — N186 End stage renal disease: Secondary | ICD-10-CM | POA: Diagnosis not present

## 2019-01-21 DIAGNOSIS — D509 Iron deficiency anemia, unspecified: Secondary | ICD-10-CM | POA: Diagnosis not present

## 2019-01-21 DIAGNOSIS — D631 Anemia in chronic kidney disease: Secondary | ICD-10-CM | POA: Diagnosis not present

## 2019-01-21 DIAGNOSIS — N2581 Secondary hyperparathyroidism of renal origin: Secondary | ICD-10-CM | POA: Diagnosis not present

## 2019-01-21 DIAGNOSIS — N186 End stage renal disease: Secondary | ICD-10-CM | POA: Diagnosis not present

## 2019-01-21 DIAGNOSIS — Z992 Dependence on renal dialysis: Secondary | ICD-10-CM | POA: Diagnosis not present

## 2019-01-24 DIAGNOSIS — Z992 Dependence on renal dialysis: Secondary | ICD-10-CM | POA: Diagnosis not present

## 2019-01-24 DIAGNOSIS — N186 End stage renal disease: Secondary | ICD-10-CM | POA: Diagnosis not present

## 2019-01-24 DIAGNOSIS — N2581 Secondary hyperparathyroidism of renal origin: Secondary | ICD-10-CM | POA: Diagnosis not present

## 2019-01-24 DIAGNOSIS — D509 Iron deficiency anemia, unspecified: Secondary | ICD-10-CM | POA: Diagnosis not present

## 2019-01-24 DIAGNOSIS — D631 Anemia in chronic kidney disease: Secondary | ICD-10-CM | POA: Diagnosis not present

## 2019-01-26 DIAGNOSIS — Z992 Dependence on renal dialysis: Secondary | ICD-10-CM | POA: Diagnosis not present

## 2019-01-26 DIAGNOSIS — N186 End stage renal disease: Secondary | ICD-10-CM | POA: Diagnosis not present

## 2019-01-26 DIAGNOSIS — D509 Iron deficiency anemia, unspecified: Secondary | ICD-10-CM | POA: Diagnosis not present

## 2019-01-26 DIAGNOSIS — D631 Anemia in chronic kidney disease: Secondary | ICD-10-CM | POA: Diagnosis not present

## 2019-01-26 DIAGNOSIS — N2581 Secondary hyperparathyroidism of renal origin: Secondary | ICD-10-CM | POA: Diagnosis not present

## 2019-01-28 DIAGNOSIS — Z992 Dependence on renal dialysis: Secondary | ICD-10-CM | POA: Diagnosis not present

## 2019-01-28 DIAGNOSIS — N2581 Secondary hyperparathyroidism of renal origin: Secondary | ICD-10-CM | POA: Diagnosis not present

## 2019-01-28 DIAGNOSIS — D631 Anemia in chronic kidney disease: Secondary | ICD-10-CM | POA: Diagnosis not present

## 2019-01-28 DIAGNOSIS — D509 Iron deficiency anemia, unspecified: Secondary | ICD-10-CM | POA: Diagnosis not present

## 2019-01-28 DIAGNOSIS — N186 End stage renal disease: Secondary | ICD-10-CM | POA: Diagnosis not present

## 2019-01-31 DIAGNOSIS — D631 Anemia in chronic kidney disease: Secondary | ICD-10-CM | POA: Diagnosis not present

## 2019-01-31 DIAGNOSIS — D509 Iron deficiency anemia, unspecified: Secondary | ICD-10-CM | POA: Diagnosis not present

## 2019-01-31 DIAGNOSIS — N2581 Secondary hyperparathyroidism of renal origin: Secondary | ICD-10-CM | POA: Diagnosis not present

## 2019-01-31 DIAGNOSIS — Z992 Dependence on renal dialysis: Secondary | ICD-10-CM | POA: Diagnosis not present

## 2019-01-31 DIAGNOSIS — N186 End stage renal disease: Secondary | ICD-10-CM | POA: Diagnosis not present

## 2019-02-01 ENCOUNTER — Encounter (INDEPENDENT_AMBULATORY_CARE_PROVIDER_SITE_OTHER): Payer: Self-pay

## 2019-02-01 ENCOUNTER — Ambulatory Visit (INDEPENDENT_AMBULATORY_CARE_PROVIDER_SITE_OTHER): Payer: Medicare Other

## 2019-02-01 ENCOUNTER — Other Ambulatory Visit: Payer: Self-pay

## 2019-02-01 ENCOUNTER — Ambulatory Visit (INDEPENDENT_AMBULATORY_CARE_PROVIDER_SITE_OTHER): Payer: Medicare Other | Admitting: Nurse Practitioner

## 2019-02-01 ENCOUNTER — Encounter (INDEPENDENT_AMBULATORY_CARE_PROVIDER_SITE_OTHER): Payer: Self-pay | Admitting: Nurse Practitioner

## 2019-02-01 VITALS — BP 146/74 | HR 63 | Resp 10 | Ht 67.5 in | Wt 166.0 lb

## 2019-02-01 DIAGNOSIS — M1611 Unilateral primary osteoarthritis, right hip: Secondary | ICD-10-CM | POA: Diagnosis not present

## 2019-02-01 DIAGNOSIS — E1122 Type 2 diabetes mellitus with diabetic chronic kidney disease: Secondary | ICD-10-CM | POA: Diagnosis not present

## 2019-02-01 DIAGNOSIS — T829XXS Unspecified complication of cardiac and vascular prosthetic device, implant and graft, sequela: Secondary | ICD-10-CM | POA: Diagnosis not present

## 2019-02-01 DIAGNOSIS — Z992 Dependence on renal dialysis: Secondary | ICD-10-CM

## 2019-02-01 DIAGNOSIS — N186 End stage renal disease: Secondary | ICD-10-CM

## 2019-02-01 DIAGNOSIS — Z794 Long term (current) use of insulin: Secondary | ICD-10-CM | POA: Diagnosis not present

## 2019-02-02 ENCOUNTER — Other Ambulatory Visit (INDEPENDENT_AMBULATORY_CARE_PROVIDER_SITE_OTHER): Payer: Self-pay

## 2019-02-02 ENCOUNTER — Telehealth (INDEPENDENT_AMBULATORY_CARE_PROVIDER_SITE_OTHER): Payer: Self-pay

## 2019-02-02 DIAGNOSIS — Z992 Dependence on renal dialysis: Secondary | ICD-10-CM | POA: Diagnosis not present

## 2019-02-02 DIAGNOSIS — D509 Iron deficiency anemia, unspecified: Secondary | ICD-10-CM | POA: Diagnosis not present

## 2019-02-02 DIAGNOSIS — D631 Anemia in chronic kidney disease: Secondary | ICD-10-CM | POA: Diagnosis not present

## 2019-02-02 DIAGNOSIS — N2581 Secondary hyperparathyroidism of renal origin: Secondary | ICD-10-CM | POA: Diagnosis not present

## 2019-02-02 DIAGNOSIS — N186 End stage renal disease: Secondary | ICD-10-CM | POA: Diagnosis not present

## 2019-02-02 NOTE — Telephone Encounter (Signed)
Spoke with the patient and she is now scheduled with Dr. Delana Meyer for angio on 02/08/2019 with a 6:45 am arrival time to the [facility]. Patient will do her Covid testing on 02/04/2019 at 10:00 am at the [facility]. Pre-procedure instructions were discussed and will be mailed to the patient and faxed to the dialysis center.

## 2019-02-04 ENCOUNTER — Other Ambulatory Visit
Admission: RE | Admit: 2019-02-04 | Discharge: 2019-02-04 | Disposition: A | Discharge status: Home / Self Care | Payer: Medicare Other | Source: Ambulatory Visit | Attending: Vascular Surgery | Admitting: Vascular Surgery

## 2019-02-04 ENCOUNTER — Other Ambulatory Visit: Payer: Self-pay

## 2019-02-04 DIAGNOSIS — D631 Anemia in chronic kidney disease: Secondary | ICD-10-CM | POA: Diagnosis not present

## 2019-02-04 DIAGNOSIS — Z20828 Contact with and (suspected) exposure to other viral communicable diseases: Secondary | ICD-10-CM | POA: Insufficient documentation

## 2019-02-04 DIAGNOSIS — Z992 Dependence on renal dialysis: Secondary | ICD-10-CM | POA: Diagnosis not present

## 2019-02-04 DIAGNOSIS — N186 End stage renal disease: Secondary | ICD-10-CM | POA: Diagnosis not present

## 2019-02-04 DIAGNOSIS — D509 Iron deficiency anemia, unspecified: Secondary | ICD-10-CM | POA: Diagnosis not present

## 2019-02-04 DIAGNOSIS — Z01812 Encounter for preprocedural laboratory examination: Secondary | ICD-10-CM | POA: Insufficient documentation

## 2019-02-04 DIAGNOSIS — N2581 Secondary hyperparathyroidism of renal origin: Secondary | ICD-10-CM | POA: Diagnosis not present

## 2019-02-04 LAB — SARS CORONAVIRUS 2 (TAT 6-24 HRS): SARS Coronavirus 2: NEGATIVE

## 2019-02-05 ENCOUNTER — Encounter (INDEPENDENT_AMBULATORY_CARE_PROVIDER_SITE_OTHER): Payer: Self-pay | Admitting: Nurse Practitioner

## 2019-02-05 NOTE — Progress Notes (Signed)
SUBJECTIVE:  Patient ID: Theresa [name], female    DOB: 06/30/1955, 63 y.o.   MRN: [id number] Chief Complaint  Patient presents with  . Follow-up    HPI  Theresa [name] is a 63 y.o. female The patient returns to the office for follow up regarding problem with the dialysis access. Currently the patient is maintained via a left forearm loop graft.  The patient has had multiple failed upper extremity accesses.  The patient notes a significant increase in bleeding time after decannulation.  The patient has also been informed that there is increased recirculation.    The patient denies hand pain or other symptoms consistent with steal phenomena.  No significant arm swelling.  The patient denies redness or swelling at the access site. The patient denies fever or chills at home or while on dialysis.  The patient denies amaurosis fugax or recent TIA symptoms. There are no recent neurological changes noted. The patient denies claudication symptoms or rest pain symptoms. The patient denies history of DVT, PE or superficial thrombophlebitis. The patient denies recent episodes of angina or shortness of breath.   No volume 1005 compared to previous study on 07/27/2018 which had a for volume of 1660.  Patent graft with diameter changes seen within the mid to distal segment.   Past Medical History:  Diagnosis Date  . Anemia of chronic disease   . Anginal pain ([place])   . Carotid arterial disease ([place])    a. 02/2013 U/S: 40-59% bilat ICA stenosis.  . Chronic constipation   . Chronic diastolic CHF (congestive heart failure) ([place])    a. 10/2013 Echo [facility]): EF 55-60%, mod conc LVH, mod MR, mildly dil LA, mild Ao sclerosis w/o stenosis; b. 02/2016 Echo: EF 55-60%, no rwma, Gr DD, mild AS, mod to sev MR, mildly dil LA, PASP 1mmHg; c. 07/2017 Echo [facility]): EF >55%, mild to mod LVH, Gr2 DD, Ao scl, Mod MR, mod dil LA, nl RV fxn.  . Colon polyps   . COPD (chronic obstructive pulmonary disease) ([place])   . Coronary  artery disease    a. 05/2013 NSTEMI/PCI: LM 20d, LAD min irregs, LCX small, nl, OM1 nl, RCA dom 65m (2.5x16 Promus DES), PDA1 80p; b. 04/2015 Cath: LM nl, LAD 54m, D1/2 min irregs, LCX 35p/m, OM2/3 min irregs, RCA patent mid stent, RPDA 70ost, RPLB1 30, RPLB2/3 min irregs, EF 55-65%--> Med Rx; c. 06/2017 [facility]): No ischemia/infarct, EF 64%.  . Diabetes mellitus   . Diabetic neuropathy ([place])   . Diabetic retinopathy ([place]) 05/28/2013   Hx bilat retinal detachment, proliferative diab retinopathy and bilat vitreous hemorrhage   . Emphysema   . ESRD on hemodialysis ([place])    a. [facility] in [place], [place] [phone]/Dr. [name], on a MWF schedule.  She started dialysis in Feb 2014.  Etiology of renal failure not known, likely diabetes.  Has a left upper arm AV graft.  . History of bronchitis    Mar 2012  . History of pneumonia    June 2012  . History of tobacco abuse    a. Quit 2012.  Marland Kitchen Hyperlipidemia   . Hypertension   . Moderate to severe mitral insufficiency    a. 10/2013 Echo: EF 55-60%, mod MR; b. 02/2016 Echo: EF 55-60%, mod to sev MR directed posteriorly--felt to be dynamic -worse with volume overload; c. 07/2017 Echo [facility]): EF >55%. Mod MR.  . Myocardial infarct ([place]) 05/2013  . Peripheral vascular disease ([place])   . Sickle cell  trait ([place])   . Thyroid nodule    [place] 06/2017 due to f/u US in 1 year     Past Surgical History:  Procedure Laterality Date  . A/V FISTULAGRAM Left 11/10/2017   Procedure: A/V FISTULAGRAM;  Surgeon: Katha Cabal, MD;  Location: [place] [facility];  Service: Cardiovascular;  Laterality: Left;  . ABDOMINAL HYSTERECTOMY     2000  . CARDIAC CATHETERIZATION    . CARDIAC CATHETERIZATION N/A 05/02/2015   Procedure: Left Heart Cath and Coronary Angiography;  Surgeon: [name], MD;  Location: [place] [facility];  Service: Cardiovascular;  Laterality: N/A;  . COLONOSCOPY WITH PROPOFOL N/A 07/08/2016   Procedure: COLONOSCOPY WITH PROPOFOL;  Surgeon: [name], MD;  Location: [facility] ENDOSCOPY;  Service: Endoscopy;  Laterality: N/A;  . COLONOSCOPY WITH PROPOFOL N/A 08/20/2017   Procedure: COLONOSCOPY WITH PROPOFOL;  Surgeon: [name], MD;  Location: [facility] ENDOSCOPY;  Service: Gastroenterology;  Laterality: N/A;  . colonscopy    . CORONARY ANGIOPLASTY  05/28/2014   stent placement to the mid RCA  . DILATION AND CURETTAGE OF UTERUS     several in the early 80's  . ESOPHAGOGASTRODUODENOSCOPY     2012  . ESOPHAGOGASTRODUODENOSCOPY (EGD) WITH PROPOFOL N/A 03/11/2018   Procedure: ESOPHAGOGASTRODUODENOSCOPY (EGD) WITH PROPOFOL;  Surgeon: [name], MD;  Location: [facility] ENDOSCOPY;  Service: Gastroenterology;  Laterality: N/A;  . EYE SURGERY     bilateral laser 2012  . EYE SURGERY     right  . EYE SURGERY     x4 both eyes  . GAS INSERTION  09/30/2011   Procedure: INSERTION OF GAS;  Surgeon: [name], MD;  Location: [place];  Service: Ophthalmology;  Laterality: Right;  [id number]  . GAS/FLUID EXCHANGE  09/30/2011   Procedure: GAS/FLUID EXCHANGE;  Surgeon: [name], MD;  Location: [place];  Service: Ophthalmology;  Laterality: Right;  . LEFT HEART CATHETERIZATION WITH CORONARY ANGIOGRAM N/A 05/28/2013   Procedure: LEFT HEART CATHETERIZATION WITH CORONARY ANGIOGRAM;  Surgeon: [name], MD;  Location: [facility] CATH LAB;  Service: Cardiovascular;  Laterality: N/A;  . PARS PLANA VITRECTOMY  04/22/2011   Procedure: PARS PLANA VITRECTOMY WITH 25 GAUGE;  Surgeon: [name], MD;  Location: [place];  Service: Ophthalmology;  Laterality: Left;  membrane peel, endolaser, gas fluid exchange, silicone oil, repair of complex traction retinal detachment  . PARS PLANA VITRECTOMY  09/30/2011   Procedure: PARS PLANA VITRECTOMY WITH 25 GAUGE;  Surgeon: [name], MD;  Location: [place];  Service: Ophthalmology;  Laterality: Right;  Endolaser; Repair of Complex Traction Retinal Detachment  . PARS PLANA VITRECTOMY  02/24/2012   Procedure: PARS PLANA  VITRECTOMY WITH 25 GAUGE;  Surgeon: [name], MD;  Location: [place];  Service: Ophthalmology;  Laterality: Left;  . PTCA    . SILICON OIL REMOVAL  3/54/5625   Procedure: SILICON OIL REMOVAL;  Surgeon: [name], MD;  Location: [place];  Service: Ophthalmology;  Laterality: Left;  . THROMBECTOMY / ARTERIOVENOUS GRAFT REVISION    . TUBAL LIGATION     1979    Social History   Socioeconomic History  . Marital status: Single    Spouse name: Not on file  . Number of children: 2  . Years of education: Not on file  . Highest education level: Not on file  Occupational History  . Not on file  Social Needs  . Financial resource strain: Not on file  . Food insecurity  Worry: Not on file    Inability: Not on file  . Transportation needs    Medical: Not on file    Non-medical: Not on file  Tobacco Use  . Smoking status: Former Smoker    Packs/day: 1.00    Years: 25.00    Pack years: 25.00    Quit date: 06/09/2010    Years since quitting: 8.6  . Smokeless tobacco: Never Used  Substance and Sexual Activity  . Alcohol use: No  . Drug use: No  . Sexual activity: Never  Lifestyle  . Physical activity    Days per week: Not on file    Minutes per session: Not on file  . Stress: Not on file  Relationships  . Social Herbalist on phone: Not on file    Gets together: Not on file    Attends religious service: Not on file    Active member of club or organization: Not on file    Attends meetings of clubs or organizations: Not on file    Relationship status: Not on file  . Intimate partner violence    Fear of current or ex partner: Not on file    Emotionally abused: Not on file    Physically abused: Not on file    Forced sexual activity: Not on file  Other Topics Concern  . Not on file  Social History Narrative   On disability.    Lives with son Brenton Grills   2 children (has son and daughter)      Son drives her since her vision has decreased    Family History   Problem Relation Age of Onset  . Stroke Mother   . Heart attack Mother   . Heart disease Mother   . Colon cancer Father   . Colon cancer Sister   . Heart attack Brother   . Stroke Brother   . Diabetes Brother   . Breast cancer Sister   . Diabetes Brother   . Diabetes Daughter   . Renal Disease Daughter        on HD  . Anesthesia problems Neg Hx   . Hypotension Neg Hx   . Malignant hyperthermia Neg Hx   . Pseudochol deficiency Neg Hx     Allergies  Allergen Reactions  . Hydrocodone Hives  . Metformin Diarrhea and Other (See Comments)    Other reaction(s): Distress (finding)  . Lisinopril     Unknown reaction      Review of Systems   Review of Systems: Negative Unless Checked Constitutional: [] Weight loss  [] Fever  [] Chills Cardiac: [] Chest pain   []  Atrial Fibrillation  [] Palpitations   [] Shortness of breath when laying flat   [] Shortness of breath with exertion. [] Shortness of breath at rest Vascular:  [] Pain in legs with walking   [] Pain in legs with standing [] Pain in legs when laying flat   [] Claudication    [] Pain in feet when laying flat    [] History of DVT   [] Phlebitis   [] Swelling in legs   [] Varicose veins   [] Non-healing ulcers Pulmonary:   [] Uses home oxygen   [] Productive cough   [] Hemoptysis   [] Wheeze  [] COPD   [] Asthma Neurologic:  [] Dizziness   [] Seizures  [] Blackouts [] History of stroke   [] History of TIA  [] Aphasia   [] Temporary Blindness   [] Weakness or numbness in arm   [] Weakness or numbness in leg Musculoskeletal:   [] Joint swelling   [] Joint pain   [] Low back  pain  []  History of Knee Replacement [x] Arthritis [] back Surgeries  []  Spinal Stenosis    Hematologic:  [] Easy bruising  [] Easy bleeding   [] Hypercoagulable state   [x] Anemic Gastrointestinal:  [] Diarrhea   [] Vomiting  [] Gastroesophageal reflux/heartburn   [] Difficulty swallowing. [] Abdominal pain Genitourinary:  [x] Chronic kidney disease   [] Difficult urination  [] Anuric   [] Blood in urine  [] Frequent urination  [] Burning with urination   [] Hematuria Skin:  [] Rashes   [] Ulcers [] Wounds Psychological:  [] History of anxiety   []  History of major depression  []  Memory Difficulties      OBJECTIVE:   Physical Exam  BP (!) 146/74 (BP Location: Right Arm, Patient Position: Sitting, Cuff Size: Normal)   Pulse 63   Resp 10   Ht 5' 7.5" (1.715 m)   Wt 166 lb (75.3 kg)   BMI 25.62 kg/m   Gen: WD/WN, NAD Head: [place]/AT, No temporalis wasting.  Ear/Nose/Throat: Hearing grossly intact, nares w/o erythema or drainage Eyes: PER, EOMI, sclera nonicteric.  Neck: Supple, no masses.  No JVD.  Pulmonary:  Good air movement, no use of accessory muscles.  Cardiac: RRR Vascular: soft thrill good bruit Vessel Right Left  Radial Palpable Palpable   Gastrointestinal: soft, non-distended. No guarding/no peritoneal signs.  Musculoskeletal: M/S 5/5 throughout.  No deformity or atrophy.  Neurologic: Pain and light touch intact in extremities.  Symmetrical.  Speech is fluent. Motor exam as listed above. Psychiatric: Judgment intact, Mood & affect appropriate for pt's clinical situation. Dermatologic: No Venous rashes. No Ulcers Noted.  No changes consistent with cellulitis. Lymph : No Cervical lymphadenopathy, no lichenification or skin changes of chronic lymphedema.       ASSESSMENT AND PLAN:  1. ESRD on hemodialysis [facility]) Recommend:  The patient is experiencing increasing problems with their dialysis access.  Patient should have a shuntogram with the intention for intervention.  The intention for intervention is to restore appropriate flow and prevent thrombosis and possible loss of the access.  As well as improve the quality of dialysis therapy.  The risks, benefits and alternative therapies were reviewed in detail with the patient.  All questions were answered.  The patient agrees to proceed with angio/intervention.      2. Osteoarthritis of right hip, unspecified osteoarthritis type  Continue NSAID medications as already ordered, these medications have been reviewed and there are no changes at this time.  Continued activity and therapy was stressed.   3. Type 2 diabetes mellitus with chronic kidney disease on chronic dialysis, with long-term current use of insulin (HCC) Continue hypoglycemic medications as already ordered, these medications have been reviewed and there are no changes at this time.  Hgb A1C to be monitored as already arranged by primary service    Current Outpatient Medications on File Prior to Visit  Medication Sig Dispense Refill  . albuterol (PROVENTIL) (2.5 MG/3ML) 0.083% nebulizer solution Take 3 mLs (2.5 mg total) by nebulization every 6 (six) hours as needed for wheezing or shortness of breath. 75 mL 11  . albuterol (VENTOLIN HFA) 108 (90 Base) MCG/ACT inhaler Inhale 1-2 puffs into the lungs every 6 (six) hours as needed for wheezing or shortness of breath. 1 Inhaler 12  . amLODipine (NORVASC) 10 MG tablet Take 10 mg by mouth daily.     Marland Kitchen aspirin EC 81 MG tablet Take 1 tablet (81 mg total) by mouth at bedtime. 30 tablet 11  . AURYXIA 1 GM 210 MG(Fe) tablet Take 210-630 mg by mouth 3 (three) times daily with  meals. Take 630 mg with each meal and 210 mg with each snack  12  . carvedilol (COREG) 12.5 MG tablet Take 1.5 tablets (18.75 mg) by mouth twice daily (Patient taking differently: Take 12.5-25 mg by mouth See admin instructions. 12.5 mg in the morning, and 6.25 mg in the evening) 270 tablet 3  . cetirizine (ZYRTEC) 5 MG tablet Take 1 tablet (5 mg total) by mouth daily as needed for allergies. (Patient taking differently: Take 5 mg by mouth daily. ) 90 tablet 3  . cinacalcet (SENSIPAR) 90 MG tablet Take 90 mg by mouth daily.     . cloNIDine (CATAPRES) 0.1 MG tablet Take 0.1 mg by mouth 2 (two) times daily.    . furosemide (LASIX) 80 MG tablet Take 1 tablet (80 mg total) by mouth See admin instructions. Take 80 mg by mouth once daily on Sun, Tue,  Thur, and Sat (non dialysis days) 48 tablet 3  . isosorbide mononitrate (IMDUR) 60 MG 24 hr tablet Take 1 tablet (60 mg total) by mouth daily. 30 tablet 11  . megestrol (MEGACE) 20 MG tablet Take 20 mg by mouth daily.    . multivitamin (RENA-VIT) TABS tablet Take 1 tablet by mouth daily.    . nitroGLYCERIN (NITROSTAT) 0.4 MG SL tablet Place 1 tablet (0.4 mg total) under the tongue every 5 (five) minutes as needed for chest pain. 25 tablet 3  . ondansetron (ZOFRAN) 4 MG tablet Take 1 tablet (4 mg total) by mouth every 8 (eight) hours as needed for nausea or vomiting. 30 tablet 2  . pregabalin (LYRICA) 75 MG capsule Take 1 capsule (75 mg total) by mouth daily. 90 capsule 1  . rOPINIRole (REQUIP) 0.25 MG tablet Take 0.5 mg by mouth 3 (three) times daily.     Marland Kitchen losartan (COZAAR) 50 MG tablet Take 50 mg by mouth daily.     Marland Kitchen UNABLE TO FIND TEST BLOOD SUGARS 2 TIMES DAILY     Current Facility-Administered Medications on File Prior to Visit  Medication Dose Route Frequency Provider Last Rate Last Dose  . betamethasone acetate-betamethasone sodium phosphate (CELESTONE) injection 3 mg  3 mg Intramuscular Once Edrick Kins, DPM        There are no Patient Instructions on file for this visit. No follow-ups on file.   Kris Hartmann, NP  This note was completed with Sales executive.  Any errors are purely unintentional.

## 2019-02-07 DIAGNOSIS — N186 End stage renal disease: Secondary | ICD-10-CM | POA: Diagnosis not present

## 2019-02-07 DIAGNOSIS — Z992 Dependence on renal dialysis: Secondary | ICD-10-CM | POA: Diagnosis not present

## 2019-02-07 DIAGNOSIS — D631 Anemia in chronic kidney disease: Secondary | ICD-10-CM | POA: Diagnosis not present

## 2019-02-07 DIAGNOSIS — D509 Iron deficiency anemia, unspecified: Secondary | ICD-10-CM | POA: Diagnosis not present

## 2019-02-07 DIAGNOSIS — N2581 Secondary hyperparathyroidism of renal origin: Secondary | ICD-10-CM | POA: Diagnosis not present

## 2019-02-08 ENCOUNTER — Ambulatory Visit
Admission: RE | Admit: 2019-02-08 | Discharge: 2019-02-08 | Disposition: A | Discharge status: Home / Self Care | Payer: Medicare Other | Attending: Vascular Surgery | Admitting: Vascular Surgery

## 2019-02-08 ENCOUNTER — Other Ambulatory Visit: Payer: Self-pay

## 2019-02-08 ENCOUNTER — Other Ambulatory Visit (INDEPENDENT_AMBULATORY_CARE_PROVIDER_SITE_OTHER): Payer: Self-pay | Admitting: Nurse Practitioner

## 2019-02-08 ENCOUNTER — Encounter
Admission: RE | Disposition: A | Discharge status: Home / Self Care | Payer: Self-pay | Source: Home / Self Care | Attending: Vascular Surgery

## 2019-02-08 ENCOUNTER — Encounter: Payer: Self-pay | Admitting: *Deleted

## 2019-02-08 DIAGNOSIS — I132 Hypertensive heart and chronic kidney disease with heart failure and with stage 5 chronic kidney disease, or end stage renal disease: Secondary | ICD-10-CM | POA: Diagnosis not present

## 2019-02-08 DIAGNOSIS — T82898A Other specified complication of vascular prosthetic devices, implants and grafts, initial encounter: Secondary | ICD-10-CM | POA: Diagnosis not present

## 2019-02-08 DIAGNOSIS — I5032 Chronic diastolic (congestive) heart failure: Secondary | ICD-10-CM | POA: Insufficient documentation

## 2019-02-08 DIAGNOSIS — Y841 Kidney dialysis as the cause of abnormal reaction of the patient, or of later complication, without mention of misadventure at the time of the procedure: Secondary | ICD-10-CM | POA: Diagnosis not present

## 2019-02-08 DIAGNOSIS — D509 Iron deficiency anemia, unspecified: Secondary | ICD-10-CM | POA: Diagnosis not present

## 2019-02-08 DIAGNOSIS — Z992 Dependence on renal dialysis: Secondary | ICD-10-CM | POA: Diagnosis not present

## 2019-02-08 DIAGNOSIS — Z794 Long term (current) use of insulin: Secondary | ICD-10-CM | POA: Insufficient documentation

## 2019-02-08 DIAGNOSIS — M1611 Unilateral primary osteoarthritis, right hip: Secondary | ICD-10-CM | POA: Diagnosis not present

## 2019-02-08 DIAGNOSIS — Z87891 Personal history of nicotine dependence: Secondary | ICD-10-CM | POA: Insufficient documentation

## 2019-02-08 DIAGNOSIS — I2510 Atherosclerotic heart disease of native coronary artery without angina pectoris: Secondary | ICD-10-CM | POA: Diagnosis not present

## 2019-02-08 DIAGNOSIS — E1151 Type 2 diabetes mellitus with diabetic peripheral angiopathy without gangrene: Secondary | ICD-10-CM | POA: Diagnosis not present

## 2019-02-08 DIAGNOSIS — Z79899 Other long term (current) drug therapy: Secondary | ICD-10-CM | POA: Diagnosis not present

## 2019-02-08 DIAGNOSIS — T82858A Stenosis of vascular prosthetic devices, implants and grafts, initial encounter: Secondary | ICD-10-CM | POA: Diagnosis not present

## 2019-02-08 DIAGNOSIS — N186 End stage renal disease: Secondary | ICD-10-CM | POA: Diagnosis not present

## 2019-02-08 DIAGNOSIS — E785 Hyperlipidemia, unspecified: Secondary | ICD-10-CM | POA: Diagnosis not present

## 2019-02-08 DIAGNOSIS — Z7982 Long term (current) use of aspirin: Secondary | ICD-10-CM | POA: Insufficient documentation

## 2019-02-08 DIAGNOSIS — J449 Chronic obstructive pulmonary disease, unspecified: Secondary | ICD-10-CM | POA: Diagnosis not present

## 2019-02-08 DIAGNOSIS — E1122 Type 2 diabetes mellitus with diabetic chronic kidney disease: Secondary | ICD-10-CM | POA: Diagnosis not present

## 2019-02-08 DIAGNOSIS — E1140 Type 2 diabetes mellitus with diabetic neuropathy, unspecified: Secondary | ICD-10-CM | POA: Insufficient documentation

## 2019-02-08 DIAGNOSIS — N2581 Secondary hyperparathyroidism of renal origin: Secondary | ICD-10-CM | POA: Diagnosis not present

## 2019-02-08 DIAGNOSIS — D631 Anemia in chronic kidney disease: Secondary | ICD-10-CM | POA: Insufficient documentation

## 2019-02-08 HISTORY — PX: A/V SHUNTOGRAM: CATH118297

## 2019-02-08 HISTORY — PX: UPPER EXTREMITY ANGIOGRAPHY: CATH118270

## 2019-02-08 LAB — POTASSIUM (ARMC VASCULAR LAB ONLY): Potassium (ARMC vascular lab): 3.7 (ref 3.5–5.1)

## 2019-02-08 LAB — GLUCOSE, CAPILLARY: Glucose-Capillary: 99 mg/dL (ref 70–99)

## 2019-02-08 SURGERY — A/V SHUNTOGRAM
Anesthesia: Moderate Sedation | Laterality: Left

## 2019-02-08 MED ORDER — CEFAZOLIN SODIUM-DEXTROSE 1-4 GM/50ML-% IV SOLN
INTRAVENOUS | Status: AC
  Filled 2019-02-08: qty 50

## 2019-02-08 MED ORDER — FAMOTIDINE 20 MG PO TABS: 40.0000 mg | ORAL_TABLET | Freq: Once | ORAL | Status: DC | PRN

## 2019-02-08 MED ORDER — SODIUM CHLORIDE (PF) 0.9 % IJ SOLN
INTRAMUSCULAR | Status: AC
  Filled 2019-02-08: qty 20

## 2019-02-08 MED ORDER — IODIXANOL 320 MG/ML IV SOLN
INTRAVENOUS | Status: DC | PRN
  Administered 2019-02-08: 55 mL via INTRA_ARTERIAL

## 2019-02-08 MED ORDER — METHYLPREDNISOLONE SODIUM SUCC 125 MG IJ SOLR: 125.0000 mg | Freq: Once | INTRAMUSCULAR | Status: DC | PRN

## 2019-02-08 MED ORDER — FENTANYL CITRATE (PF) 100 MCG/2ML IJ SOLN
INTRAMUSCULAR | Status: AC
  Filled 2019-02-08: qty 4

## 2019-02-08 MED ORDER — CEFAZOLIN SODIUM-DEXTROSE 1-4 GM/50ML-% IV SOLN
1.0000 g | Freq: Once | INTRAVENOUS | Status: AC
  Administered 2019-02-08: 1 g via INTRAVENOUS

## 2019-02-08 MED ORDER — DIPHENHYDRAMINE HCL 50 MG/ML IJ SOLN: 50.0000 mg | Freq: Once | INTRAMUSCULAR | Status: DC | PRN

## 2019-02-08 MED ORDER — MIDAZOLAM HCL 5 MG/5ML IJ SOLN
INTRAMUSCULAR | Status: AC
  Filled 2019-02-08: qty 5

## 2019-02-08 MED ORDER — MIDAZOLAM HCL 2 MG/2ML IJ SOLN
INTRAMUSCULAR | Status: DC | PRN
  Administered 2019-02-08: 2 mg via INTRAVENOUS
  Administered 2019-02-08: 1 mg via INTRAVENOUS

## 2019-02-08 MED ORDER — ONDANSETRON HCL 4 MG/2ML IJ SOLN: 4.0000 mg | Freq: Four times a day (QID) | INTRAMUSCULAR | Status: DC | PRN

## 2019-02-08 MED ORDER — MIDAZOLAM HCL 2 MG/ML PO SYRP: 8.0000 mg | ORAL_SOLUTION | Freq: Once | ORAL | Status: DC | PRN

## 2019-02-08 MED ORDER — FENTANYL CITRATE (PF) 100 MCG/2ML IJ SOLN
INTRAMUSCULAR | Status: DC | PRN
  Administered 2019-02-08 (×2): 50 ug via INTRAVENOUS

## 2019-02-08 MED ORDER — HEPARIN SODIUM (PORCINE) 1000 UNIT/ML IJ SOLN
INTRAMUSCULAR | Status: AC
  Filled 2019-02-08: qty 1

## 2019-02-08 MED ORDER — HEPARIN SODIUM (PORCINE) 1000 UNIT/ML IJ SOLN
INTRAMUSCULAR | Status: DC | PRN
  Administered 2019-02-08: 4000 [IU] via INTRAVENOUS

## 2019-02-08 MED ORDER — SODIUM CHLORIDE 0.9 % IV SOLN: INTRAVENOUS | Status: DC

## 2019-02-08 MED ORDER — FENTANYL CITRATE (PF) 100 MCG/2ML IJ SOLN: 12.5000 ug | Freq: Once | INTRAMUSCULAR | Status: DC | PRN

## 2019-02-08 MED ORDER — NITROGLYCERIN 1 MG/10 ML FOR IR/CATH LAB
INTRA_ARTERIAL | Status: DC | PRN
  Administered 2019-02-08: 500 ug

## 2019-02-08 SURGICAL SUPPLY — 22 items
BALLN ATG 12X4X80 (BALLOONS) ×2
BALLN LUTONIX AV 8X40X75 (BALLOONS) ×2
BALLN LUTONIX DCB 7X40X130 (BALLOONS) ×2
BALLOON ATG 12X4X80 (BALLOONS) ×1 IMPLANT
BALLOON LUTONIX AV 8X40X75 (BALLOONS) ×1 IMPLANT
BALLOON LUTONIX DCB 7X40X130 (BALLOONS) ×1 IMPLANT
CATH BEACON 5 .035 65 KMP TIP (CATHETERS) ×2 IMPLANT
DEVICE PRESTO INFLATION (MISCELLANEOUS) ×2 IMPLANT
DEVICE TORQUE .025-.038 (MISCELLANEOUS) ×2 IMPLANT
DRAPE BRACHIAL (DRAPES) ×2 IMPLANT
GUIDEWIRE ANGLED .035 180CM (WIRE) ×4 IMPLANT
NEEDLE ENTRY 21GA 7CM ECHOTIP (NEEDLE) ×2 IMPLANT
PACK ANGIOGRAPHY (CUSTOM PROCEDURE TRAY) ×2 IMPLANT
SET INTRO CAPELLA COAXIAL (SET/KITS/TRAYS/PACK) ×2 IMPLANT
SHEATH BRITE TIP 6FRX5.5 (SHEATH) ×4 IMPLANT
SHEATH BRITE TIP 7FRX5.5 (SHEATH) ×2 IMPLANT
STENT VENOVO 14X40X80 (Permanent Stent) ×2 IMPLANT
SUT MNCRL AB 4-0 PS2 18 (SUTURE) ×2 IMPLANT
SYR MEDRAD MARK 7 150ML (SYRINGE) ×2 IMPLANT
TOWEL OR 17X26 4PK STRL BLUE (TOWEL DISPOSABLE) ×2 IMPLANT
TUBING CONTRAST HIGH PRESS 72 (TUBING) ×2 IMPLANT
WIRE MAGIC TORQUE 260C (WIRE) ×2 IMPLANT

## 2019-02-08 NOTE — H&P (Signed)
[facility] History & Physical Update  The patient was interviewed and re-examined.  The patient's previous History and Physical has been reviewed and is unchanged.  There is no change in the plan of care. We plan to proceed with the scheduled procedure.  Hortencia Pilar, MD  02/08/2019, 8:18 AM

## 2019-02-08 NOTE — Op Note (Signed)
OPERATIVE NOTE   PROCEDURE: 1. Contrast injection left forearm loop AV access 2. Percutaneous transluminal angioplasty peripheral segment to 7 mm venous outflow brachial vein 3. Percutaneous transluminal angioplasty and stent placement central venous segment to 12 mm 4. Arch aortogram and left upper extremity arteriogram from AV access  PRE-OPERATIVE DIAGNOSIS: Complication of dialysis access; right arm pain worse during dialysis.  2.  End Stage Renal Disease  POST-OPERATIVE DIAGNOSIS: same as above   SURGEON: Katha Cabal, M.D.  ANESTHESIA: Conscious sedation was administered under my direct supervision by the interventional radiology RN. IV Versed plus fentanyl were utilized. Continuous ECG, pulse oximetry and blood pressure was monitored throughout the entire procedure.  Conscious sedation was for a total of 50 minutes.  ESTIMATED BLOOD LOSS: minimal  FINDING(S): Stricture of the AV graft within the peripheral segment as well as a stricture within the central venous portion  SPECIMEN(S):  None  CONTRAST: 55 cc  FLUOROSCOPY TIME: 6.0 minutes  INDICATIONS: Theresa [name] is a 63 y.o. female who  presents with malfunctioning left arm AV access.  The patient is scheduled for angiography with possible intervention of the AV access.  The patient is aware the risks include but are not limited to: bleeding, infection, thrombosis of the cannulated access, and possible anaphylactic reaction to the contrast.  The patient acknowledges if the access can not be salvaged a tunneled catheter will be needed and will be placed during this procedure.  The patient is aware of the risks of the procedure and elects to proceed with the angiogram and intervention.  DESCRIPTION: After full informed written consent was obtained, the patient was brought back to the Special Procedure suite and placed supine position.  Appropriate cardiopulmonary monitors were placed.  The left arm was prepped and draped  in the standard fashion.  Appropriate timeout is called. The left forearm loop graft was cannulated with a micropuncture needle.  Cannulation was performed with ultrasound guidance. Ultrasound was placed in a sterile sleeve, the AV access was interrogated and noted to be echolucent and compressible indicating patency. Image was recorded for the permanent record. The puncture is performed under continuous ultrasound visualization.   The microwire was advanced and the needle was exchanged for  a microsheath.  The J-wire was then advanced and a 6 Fr sheath inserted.  Hand injections were completed to image the access from the arterial anastomosis through the entire access.  The central venous structures were also imaged by hand injections.  Based on the images,  4000 units of heparin was given and a wire was negotiated through the strictures within the venous portion of the graft as well as the central stenosis. The sheath was then upsized to a 7 [place] sheath.  An 8 mm x 40 mm Lutonix balloon was used.  Inflation was to 14 atm.  Follow-up imaging demonstrated greater than 70% residual stenosis and a 14 mm x 40 mm via Novo stent was deployed across the subclavian stricture and postdilated with a 12 mm Atlas balloon.  Follow-up imaging demonstrated the stent in good position with less than 10% residual stenosis.    The detector was then repositioned over the peripheral portion of the AV access and a 7 mm x 40 mm Lutonix balloon was used to treat the stricture just proximal to the venous outflow in the brachial vein. Inflation was to 10 atm for 1 minutes.  Follow-up imaging demonstrates significant improvement with a marked reduction of the peripheral stricture, less than 15%  residual stenosis was noted.  Some spasm was noted and this was treated with 200 mcg of nitroglycerin administered intravenously.  There is now rapid flow of contrast through the graft and the central veins.  Attention was then turned to  evaluating the arterial flow.  Ultrasound was delivered back to the sterile field and placed in a sterile sleeve.  The graft was imaged it is echolucent indicating good flow and patency.  Microneedle is then inserted in a retrograde fashion followed by microwire micro sheath.  J-wire followed by a 6 French sheath.  Using a floppy Glidewire and a Kumpe catheter the Kumpe is negotiated into the aortic arch where both an AP as well as an LAO projection of the subclavian artery is performed.  The catheter is then pulled back serially to complete the angiogram at the level of the AV anastomosis the wire was reintroduced and the catheter is advanced distally so that imaging of the forearm arteries and hand can also be obtained.  Interpretation the origin of the subclavian is widely patent there is mild calcific disease noted but this is less than 20%.  Throughout the course of the subclavian axillary and brachial arteries there are no hemodynamically significant lesions identified.  There is two-vessel runoff to the hand via the radial and the ulnar and the palmar arch is intact.  The ulnar somewhat small and diffusely diseased but there are no flow-limiting lesions identified radial artery is dominant and widely patent to the hand.  No evidence of clinically significant steal is identified.  No arterial lesions are identified.  A 4-0 Monocryl purse-string suture was sewn around the sheath.  The sheath was removed and light pressure was applied.  A sterile bandage was applied to the puncture site.    COMPLICATIONS: None  CONDITION: Theresa [name], M.D [facility] Office: [phone]  02/08/2019 9:25 AM

## 2019-02-11 DIAGNOSIS — D509 Iron deficiency anemia, unspecified: Secondary | ICD-10-CM | POA: Diagnosis not present

## 2019-02-11 DIAGNOSIS — D631 Anemia in chronic kidney disease: Secondary | ICD-10-CM | POA: Diagnosis not present

## 2019-02-11 DIAGNOSIS — Z992 Dependence on renal dialysis: Secondary | ICD-10-CM | POA: Diagnosis not present

## 2019-02-11 DIAGNOSIS — N2581 Secondary hyperparathyroidism of renal origin: Secondary | ICD-10-CM | POA: Diagnosis not present

## 2019-02-11 DIAGNOSIS — N186 End stage renal disease: Secondary | ICD-10-CM | POA: Diagnosis not present

## 2019-02-14 DIAGNOSIS — D509 Iron deficiency anemia, unspecified: Secondary | ICD-10-CM | POA: Diagnosis not present

## 2019-02-14 DIAGNOSIS — N2581 Secondary hyperparathyroidism of renal origin: Secondary | ICD-10-CM | POA: Diagnosis not present

## 2019-02-14 DIAGNOSIS — Z992 Dependence on renal dialysis: Secondary | ICD-10-CM | POA: Diagnosis not present

## 2019-02-14 DIAGNOSIS — D631 Anemia in chronic kidney disease: Secondary | ICD-10-CM | POA: Diagnosis not present

## 2019-02-14 DIAGNOSIS — N186 End stage renal disease: Secondary | ICD-10-CM | POA: Diagnosis not present

## 2019-02-16 DIAGNOSIS — N2581 Secondary hyperparathyroidism of renal origin: Secondary | ICD-10-CM | POA: Diagnosis not present

## 2019-02-16 DIAGNOSIS — D631 Anemia in chronic kidney disease: Secondary | ICD-10-CM | POA: Diagnosis not present

## 2019-02-16 DIAGNOSIS — Z992 Dependence on renal dialysis: Secondary | ICD-10-CM | POA: Diagnosis not present

## 2019-02-16 DIAGNOSIS — N186 End stage renal disease: Secondary | ICD-10-CM | POA: Diagnosis not present

## 2019-02-16 DIAGNOSIS — D509 Iron deficiency anemia, unspecified: Secondary | ICD-10-CM | POA: Diagnosis not present

## 2019-02-17 ENCOUNTER — Encounter: Payer: Self-pay | Admitting: Podiatry

## 2019-02-17 ENCOUNTER — Other Ambulatory Visit: Payer: Self-pay

## 2019-02-17 ENCOUNTER — Ambulatory Visit (INDEPENDENT_AMBULATORY_CARE_PROVIDER_SITE_OTHER): Payer: Medicare Other | Admitting: Podiatry

## 2019-02-17 DIAGNOSIS — M79675 Pain in left toe(s): Secondary | ICD-10-CM

## 2019-02-17 DIAGNOSIS — B351 Tinea unguium: Secondary | ICD-10-CM | POA: Diagnosis not present

## 2019-02-17 DIAGNOSIS — M79674 Pain in right toe(s): Secondary | ICD-10-CM | POA: Diagnosis not present

## 2019-02-17 DIAGNOSIS — E1142 Type 2 diabetes mellitus with diabetic polyneuropathy: Secondary | ICD-10-CM

## 2019-02-17 DIAGNOSIS — L608 Other nail disorders: Secondary | ICD-10-CM

## 2019-02-17 NOTE — Progress Notes (Addendum)
Complaint:  Visit Type: Patient returns to my office for continued preventative foot care services. Complaint: Patient states" my nails have grown long and thick and become painful to walk and wear shoes" Patient has been diagnosed with DM with no foot complications. The patient presents for preventative foot care services. No changes to ROS  Podiatric Exam: Vascular: dorsalis pedis and posterior tibial pulses are palpable bilateral. Capillary return is immediate. Temperature gradient is WNL. Skin turgor WNL  Sensorium: Normal Semmes Weinstein monofilament test. Normal tactile sensation bilaterally. Nail Exam: Pt has thick disfigured discolored nails with subungual debris noted bilateral entire nail hallux through fifth toenails.  Pincer nails  B/L Ulcer Exam: There is no evidence of ulcer or pre-ulcerative changes or infection. Orthopedic Exam: Muscle tone and strength are WNL. No limitations in general ROM. No crepitus or effusions noted. Foot type and digits show no abnormalities. HAV  B/L Skin: No Porokeratosis. No infection or ulcers.   Diagnosis:  Onychomycosis, , Pain in right toe, pain in left toes  Treatment & Plan Procedures and Treatment: Consent by patient was obtained for treatment procedures. The patient understood the discussion of treatment and procedures well. All questions were answered thoroughly reviewed. Debridement of mycotic and hypertrophic toenails, 1 through 5 bilateral and clearing of subungual debris. No ulceration, no infection noted. Patient has hemorrhagic skin distal subungual right hallux.  To watch in future. Return Visit-Office Procedure: Patient instructed to return to the office for a follow up visit 3 months for continued evaluation and treatment.    Gardiner Barefoot DPM

## 2019-02-18 DIAGNOSIS — N186 End stage renal disease: Secondary | ICD-10-CM

## 2019-02-18 DIAGNOSIS — Z01818 Encounter for other preprocedural examination: Secondary | ICD-10-CM

## 2019-02-18 DIAGNOSIS — Z7682 Awaiting organ transplant status: Secondary | ICD-10-CM

## 2019-02-18 DIAGNOSIS — Z992 Dependence on renal dialysis: Secondary | ICD-10-CM | POA: Diagnosis not present

## 2019-02-18 DIAGNOSIS — D509 Iron deficiency anemia, unspecified: Secondary | ICD-10-CM | POA: Diagnosis not present

## 2019-02-18 DIAGNOSIS — D631 Anemia in chronic kidney disease: Secondary | ICD-10-CM | POA: Diagnosis not present

## 2019-02-18 DIAGNOSIS — N2581 Secondary hyperparathyroidism of renal origin: Secondary | ICD-10-CM | POA: Diagnosis not present

## 2019-02-19 DIAGNOSIS — Z992 Dependence on renal dialysis: Secondary | ICD-10-CM | POA: Diagnosis not present

## 2019-02-19 DIAGNOSIS — N2581 Secondary hyperparathyroidism of renal origin: Secondary | ICD-10-CM | POA: Diagnosis not present

## 2019-02-19 DIAGNOSIS — D631 Anemia in chronic kidney disease: Secondary | ICD-10-CM | POA: Diagnosis not present

## 2019-02-19 DIAGNOSIS — N186 End stage renal disease: Secondary | ICD-10-CM | POA: Diagnosis not present

## 2019-02-19 DIAGNOSIS — D509 Iron deficiency anemia, unspecified: Secondary | ICD-10-CM | POA: Diagnosis not present

## 2019-02-21 DIAGNOSIS — N2581 Secondary hyperparathyroidism of renal origin: Secondary | ICD-10-CM | POA: Diagnosis not present

## 2019-02-21 DIAGNOSIS — N186 End stage renal disease: Secondary | ICD-10-CM | POA: Diagnosis not present

## 2019-02-21 DIAGNOSIS — D631 Anemia in chronic kidney disease: Secondary | ICD-10-CM | POA: Diagnosis not present

## 2019-02-21 DIAGNOSIS — D509 Iron deficiency anemia, unspecified: Secondary | ICD-10-CM | POA: Diagnosis not present

## 2019-02-21 DIAGNOSIS — Z992 Dependence on renal dialysis: Secondary | ICD-10-CM | POA: Diagnosis not present

## 2019-02-22 DIAGNOSIS — R251 Tremor, unspecified: Secondary | ICD-10-CM | POA: Diagnosis not present

## 2019-02-22 DIAGNOSIS — R413 Other amnesia: Secondary | ICD-10-CM | POA: Diagnosis not present

## 2019-02-23 DIAGNOSIS — D509 Iron deficiency anemia, unspecified: Secondary | ICD-10-CM | POA: Diagnosis not present

## 2019-02-23 DIAGNOSIS — N2581 Secondary hyperparathyroidism of renal origin: Secondary | ICD-10-CM | POA: Diagnosis not present

## 2019-02-23 DIAGNOSIS — Z992 Dependence on renal dialysis: Secondary | ICD-10-CM | POA: Diagnosis not present

## 2019-02-23 DIAGNOSIS — D631 Anemia in chronic kidney disease: Secondary | ICD-10-CM | POA: Diagnosis not present

## 2019-02-23 DIAGNOSIS — N186 End stage renal disease: Secondary | ICD-10-CM | POA: Diagnosis not present

## 2019-02-24 ENCOUNTER — Encounter: Payer: Self-pay | Admitting: Internal Medicine

## 2019-02-24 ENCOUNTER — Other Ambulatory Visit: Payer: Self-pay

## 2019-02-24 ENCOUNTER — Ambulatory Visit (INDEPENDENT_AMBULATORY_CARE_PROVIDER_SITE_OTHER): Payer: Medicare Other | Admitting: Internal Medicine

## 2019-02-24 VITALS — BP 114/64 | Ht 67.5 in | Wt 166.0 lb

## 2019-02-24 DIAGNOSIS — N186 End stage renal disease: Secondary | ICD-10-CM

## 2019-02-24 DIAGNOSIS — E215 Disorder of parathyroid gland, unspecified: Secondary | ICD-10-CM

## 2019-02-24 DIAGNOSIS — E041 Nontoxic single thyroid nodule: Secondary | ICD-10-CM | POA: Diagnosis not present

## 2019-02-24 DIAGNOSIS — J432 Centrilobular emphysema: Secondary | ICD-10-CM

## 2019-02-24 DIAGNOSIS — I10 Essential (primary) hypertension: Secondary | ICD-10-CM | POA: Diagnosis not present

## 2019-02-24 DIAGNOSIS — R937 Abnormal findings on diagnostic imaging of other parts of musculoskeletal system: Secondary | ICD-10-CM

## 2019-02-24 DIAGNOSIS — G4733 Obstructive sleep apnea (adult) (pediatric): Secondary | ICD-10-CM | POA: Diagnosis not present

## 2019-02-24 DIAGNOSIS — Z9989 Dependence on other enabling machines and devices: Secondary | ICD-10-CM

## 2019-02-24 DIAGNOSIS — Z992 Dependence on renal dialysis: Secondary | ICD-10-CM | POA: Diagnosis not present

## 2019-02-24 DIAGNOSIS — M5440 Lumbago with sciatica, unspecified side: Secondary | ICD-10-CM

## 2019-02-24 DIAGNOSIS — M5137 Other intervertebral disc degeneration, lumbosacral region: Secondary | ICD-10-CM

## 2019-02-24 DIAGNOSIS — F0390 Unspecified dementia without behavioral disturbance: Secondary | ICD-10-CM | POA: Diagnosis not present

## 2019-02-24 DIAGNOSIS — G8929 Other chronic pain: Secondary | ICD-10-CM | POA: Diagnosis not present

## 2019-02-24 NOTE — Progress Notes (Addendum)
Virtual Visit via Video Note  I connected with Theresa [name]  on 02/28/19 at 11:00 AM EDT by a video enabled telemedicine application and verified that I am speaking with the correct person using two identifiers.  Location patient: home Location provider:work or home office Persons participating in the virtual visit: patient, provider, pts son and grandkids  I discussed the limitations of evaluation and management by telemedicine and the availability of in person appointments. The patient expressed understanding and agreed to proceed.   HPI: 1. Chronic back pain pt ready for surgery and wants to see Dr. Cari Caraway Dr potter referred to orthopedics but she is already established with Dr. Daun Peacock and wants to get surgery done. She initially wanted to hold on surgery due to ESRD and on kidney transplant list x 6 years but wait is too long for transplant and wants to get back pain addressed    2. HTN BP 02/23/19 was 114/64 on meds   3. Tremor requip did not help so Dr. Melrose Nakayama stopped and c/w memory loss and placed on Aricept 5 mg qd   4. Reduce appetite improved since on megace 20 mg qd by renal and new dry weight is 172 per pt and she has gained wt on megace advised pt to cut pill in 1/2 dose   Nausea has resolved   5. ESRD on HD Dr. Holley Raring if renal MD and Hinton Dyer head RN at HD center need to get copy of labs pt had recently 02/21/2019 she reports her phosphorus was high but has been eating/drinking foods high in phos and kidney MD will repeat upcoming  6. COPD breathing good and OSA on cpap. Her allergies are also improved.    7. She needs yearly disability paperwork filled out      ROS: See pertinent positives and negatives per HPI.  Past Medical History:  Diagnosis Date  . Anemia of chronic disease   . Anginal pain ([place])   . Carotid arterial disease ([place])    a. 02/2013 U/S: 40-59% bilat ICA stenosis.  . Chronic constipation   . Chronic diastolic CHF (congestive heart failure)  ([place])    a. 10/2013 Echo [facility]): EF 55-60%, mod conc LVH, mod MR, mildly dil LA, mild Ao sclerosis w/o stenosis; b. 02/2016 Echo: EF 55-60%, no rwma, Gr DD, mild AS, mod to sev MR, mildly dil LA, PASP 13mHg; c. 07/2017 Echo ([facility]: EF >55%, mild to mod LVH, Gr2 DD, Ao scl, Mod MR, mod dil LA, nl RV fxn.  . Colon polyps   . COPD (chronic obstructive pulmonary disease) ([place]   . Coronary artery disease    a. 05/2013 NSTEMI/PCI: LM 20d, LAD min irregs, LCX small, nl, OM1 nl, RCA dom 968m2.5x16 Promus DES), PDA1 80p; b. 04/2015 Cath: LM nl, LAD 3062m1/2 min irregs, LCX 35p/m, OM2/3 min irregs, RCA patent mid stent, RPDA 70ost, RPLB1 30, RPLB2/3 min irregs, EF 55-65%--> Med Rx; [facility] 64%.  . Diabetes mellitus   . Diabetic neuropathy ([place] . Diabetic retinopathy ([place]/20/2014   Hx bilat retinal detachment, proliferative diab retinopathy and bilat vitreous hemorrhage   . Emphysema   . [place]D [place]ialysis ([place]  a. [facility] [id number] [phone]/Dr. [name], on a MWF schedule.  She started dialysis in Feb 2014.  Etiology of renal failure not known, likely diabetes.  Has a left upper arm AV graft.  . History of bronchitis    Mar 2012  .  History of pneumonia    June 2012  . History of tobacco abuse    a. Quit 2012.  Marland Kitchen Hyperlipidemia   . Hypertension   . Moderate to severe mitral insufficiency    a. 10/2013 Echo: EF 55-60%, mod MR; b. 02/2016 Echo: EF 55-60%, mod to sev MR directed posteriorly--felt to be dynamic -worse with volume overload; c. 07/2017 Echo [facility]): EF >55%. Mod MR.  . Myocardial infarct ([place]) 05/2013  . Peripheral vascular disease ([place])   . Sickle cell trait ([place])   . Thyroid nodule    [place] 06/2017 due to f/u US in 1 year     Past Surgical History:  Procedure Laterality Date  . A/V FISTULAGRAM Left 11/10/2017   Procedure: A/V FISTULAGRAM;  Surgeon: Katha Cabal, MD;  Location: [place] CV LAB;  Service: Cardiovascular;   Laterality: Left;  . A/V SHUNTOGRAM Left 02/08/2019   Procedure: A/V SHUNTOGRAM;  Surgeon: Katha Cabal, MD;  Location: [place] CV LAB;  Service: Cardiovascular;  Laterality: Left;  . ABDOMINAL HYSTERECTOMY     2000  . CARDIAC CATHETERIZATION    . CARDIAC CATHETERIZATION N/A 05/02/2015   Procedure: Left Heart Cath and Coronary Angiography;  Surgeon: [name], MD;  Location: [place] CV LAB;  Service: Cardiovascular;  Laterality: N/A;  . COLONOSCOPY WITH PROPOFOL N/A 07/08/2016   Procedure: COLONOSCOPY WITH PROPOFOL;  Surgeon: [name], MD;  Location: [facility] ENDOSCOPY;  Service: Endoscopy;  Laterality: N/A;  . COLONOSCOPY WITH PROPOFOL N/A 08/20/2017   Procedure: COLONOSCOPY WITH PROPOFOL;  Surgeon: [name], MD;  Location: [facility] ENDOSCOPY;  Service: Gastroenterology;  Laterality: N/A;  . colonscopy    . CORONARY ANGIOPLASTY  05/28/2014   stent placement to the mid RCA  . DILATION AND CURETTAGE OF UTERUS     several in the early 80's  . ESOPHAGOGASTRODUODENOSCOPY     2012  . ESOPHAGOGASTRODUODENOSCOPY (EGD) WITH PROPOFOL N/A 03/11/2018   Procedure: ESOPHAGOGASTRODUODENOSCOPY (EGD) WITH PROPOFOL;  Surgeon: [name], MD;  Location: [facility] ENDOSCOPY;  Service: Gastroenterology;  Laterality: N/A;  . EYE SURGERY     bilateral laser 2012  . EYE SURGERY     right  . EYE SURGERY     x4 both eyes  . GAS INSERTION  09/30/2011   Procedure: INSERTION OF GAS;  Surgeon: [name], MD;  Location: [place];  Service: Ophthalmology;  Laterality: Right;  [id number]  . GAS/FLUID EXCHANGE  09/30/2011   Procedure: GAS/FLUID EXCHANGE;  Surgeon: [name], MD;  Location: [place];  Service: Ophthalmology;  Laterality: Right;  . LEFT HEART CATHETERIZATION WITH CORONARY ANGIOGRAM N/A 05/28/2013   Procedure: LEFT HEART CATHETERIZATION WITH CORONARY ANGIOGRAM;  Surgeon: [name], MD;  Location: [facility] CATH LAB;  Service: Cardiovascular;  Laterality: N/A;  . PARS PLANA VITRECTOMY   04/22/2011   Procedure: PARS PLANA VITRECTOMY WITH 25 GAUGE;  Surgeon: [name], MD;  Location: [place];  Service: Ophthalmology;  Laterality: Left;  membrane peel, endolaser, gas fluid exchange, silicone oil, repair of complex traction retinal detachment  . PARS PLANA VITRECTOMY  09/30/2011   Procedure: PARS PLANA VITRECTOMY WITH 25 GAUGE;  Surgeon: [name], MD;  Location: [place];  Service: Ophthalmology;  Laterality: Right;  Endolaser; Repair of Complex Traction Retinal Detachment  . PARS PLANA VITRECTOMY  02/24/2012   Procedure: PARS PLANA VITRECTOMY WITH 25 GAUGE;  Surgeon: [name], MD;  Location: [place];  Service: Ophthalmology;  Laterality: Left;  .  PTCA    . SILICON OIL REMOVAL  3/66/2947   Procedure: SILICON OIL REMOVAL;  Surgeon: [name], MD;  Location: [place];  Service: Ophthalmology;  Laterality: Left;  . THROMBECTOMY / ARTERIOVENOUS GRAFT REVISION    . TUBAL LIGATION     1979  . UPPER EXTREMITY ANGIOGRAPHY Left 02/08/2019   Procedure: UPPER EXTREMITY ANGIOGRAPHY;  Surgeon: Katha Cabal, MD;  Location: [place] CV LAB;  Service: Cardiovascular;  Laterality: Left;    Family History  Problem Relation Age of Onset  . Stroke Mother   . Heart attack Mother   . Heart disease Mother   . Colon cancer Father   . Colon cancer Sister   . Heart attack Brother   . Stroke Brother   . Diabetes Brother   . Breast cancer Sister   . Diabetes Brother   . Diabetes Daughter   . Renal Disease Daughter        on HD  . Anesthesia problems Neg Hx   . Hypotension Neg Hx   . Malignant hyperthermia Neg Hx   . Pseudochol deficiency Neg Hx     SOCIAL HX:  On disability.  Lives with son Brenton Grills 2 children (has son and daughter)  Son drives her since her vision has decreased   Current Outpatient Medications:  .  albuterol (PROVENTIL) (2.5 MG/3ML) 0.083% nebulizer solution, Take 3 mLs (2.5 mg total) by nebulization every 6 (six) hours as needed for wheezing  or shortness of breath., Disp: 75 mL, Rfl: 11 .  albuterol (VENTOLIN HFA) 108 (90 Base) MCG/ACT inhaler, Inhale 1-2 puffs into the lungs every 6 (six) hours as needed for wheezing or shortness of breath., Disp: 1 Inhaler, Rfl: 12 .  amLODipine (NORVASC) 10 MG tablet, Take 10 mg by mouth daily. , Disp: , Rfl:  .  aspirin EC 81 MG tablet, Take 1 tablet (81 mg total) by mouth at bedtime., Disp: 30 tablet, Rfl: 11 .  AURYXIA 1 GM 210 MG(Fe) tablet, Take 210-630 mg by mouth 3 (three) times daily with meals. Take 630 mg with each meal and 210 mg with each snack, Disp: , Rfl: 12 .  carvedilol (COREG) 12.5 MG tablet, Take 1.5 tablets (18.75 mg) by mouth twice daily (Patient taking differently: Take 12.5-25 mg by mouth See admin instructions. 12.5 mg in the morning, and 6.25 mg in the evening), Disp: 270 tablet, Rfl: 3 .  cetirizine (ZYRTEC) 5 MG tablet, Take 1 tablet (5 mg total) by mouth daily as needed for allergies. (Patient taking differently: Take 5 mg by mouth daily. ), Disp: 90 tablet, Rfl: 3 .  cinacalcet (SENSIPAR) 90 MG tablet, Take 90 mg by mouth daily. , Disp: , Rfl:  .  cloNIDine (CATAPRES) 0.1 MG tablet, Take 0.1 mg by mouth 2 (two) times daily., Disp: , Rfl:  .  donepezil (ARICEPT) 5 MG tablet, Take 5 mg by mouth daily., Disp: , Rfl:  .  furosemide (LASIX) 80 MG tablet, Take 1 tablet (80 mg total) by mouth See admin instructions. Take 80 mg by mouth once daily on Sun, Tue, Thur, and Sat (non dialysis days), Disp: 48 tablet, Rfl: 3 .  isosorbide mononitrate (IMDUR) 60 MG 24 hr tablet, Take 1 tablet (60 mg total) by mouth daily., Disp: 30 tablet, Rfl: 11 .  losartan (COZAAR) 50 MG tablet, Take 50 mg by mouth daily. , Disp: , Rfl:  .  megestrol (MEGACE) 20 MG tablet, Take 20 mg by mouth daily., Disp: ,  Rfl:  .  multivitamin (RENA-VIT) TABS tablet, Take 1 tablet by mouth daily., Disp: , Rfl:  .  nitroGLYCERIN (NITROSTAT) 0.4 MG SL tablet, Place 1 tablet (0.4 mg total) under the tongue every 5  (five) minutes as needed for chest pain., Disp: 25 tablet, Rfl: 3 .  ondansetron (ZOFRAN) 4 MG tablet, Take 1 tablet (4 mg total) by mouth every 8 (eight) hours as needed for nausea or vomiting., Disp: 30 tablet, Rfl: 2 .  pregabalin (LYRICA) 75 MG capsule, Take 1 capsule (75 mg total) by mouth daily., Disp: 90 capsule, Rfl: 1 .  rOPINIRole (REQUIP) 0.25 MG tablet, Take 0.5 mg by mouth 3 (three) times daily. , Disp: , Rfl:  .  UNABLE TO FIND, TEST BLOOD SUGARS 2 TIMES DAILY, Disp: , Rfl:   Current Facility-Administered Medications:  .  betamethasone acetate-betamethasone sodium phosphate (CELESTONE) injection 3 mg, 3 mg, Intramuscular, Once, Evans, Dorathy Daft, DPM  EXAM:  VITALS per patient if applicable:  GENERAL: alert, oriented, appears well and in no acute distress  HEENT: atraumatic, conjunttiva clear, no obvious abnormalities on inspection of external nose and ears  NECK: normal movements of the head and neck  LUNGS: on inspection no signs of respiratory distress, breathing rate appears normal, no obvious gross SOB, gasping or wheezing  CV: no obvious cyanosis  MS: moves all visible extremities without noticeable abnormality  PSYCH/NEURO: pleasant and cooperative, no obvious depression or anxiety, speech and thought processing grossly intact  ASSESSMENT AND PLAN:  Discussed the following assessment and plan:  Chronic low back pain with sciatica, sciatica laterality unspecified, unspecified back pain laterality - Plan: Ambulatory referral to Neurosurgery Dr. Joretta Bachelor  Abnormal MRI, lumbar spine with herniated disc and DDD - Plan: Ambulatory referral to Neurosurgery -see above  ESRD on hemodialysis ([place]) TTHS -of note pt is not taking sensipar per pharmacy and has elevated phos -due to weight gain on megace 20 mg qd advised her to cut the dose in 1/2   Centrilobular emphysema (HCC) OSA on CPAP -f/u pulm controlled and stable -not taking anoro copay was high and spoke with  phramacy 02/28/2019 pt not picked up   Essential (primary) hypertension -confirm and updated meds with pharmacy 02/28/19 on norvasc 10 mg qd, coreg 18.75 mg bid, losartan 50 mg qd clonidine 0.1 bid, lasix 80 on Sunday, tues, thurs, sat non HD days per instructions  -of note per pharm pt has not picked up imdur in a while  -also patient wants to know from Dr. Holley Raring how to hold medications prior to dialysis which she has been instructed to do  Reviewed labs 02/21/19 [place] from [facility] fax # [phone] phone [phone]  H/H 9.2/27.9 (low)  WBC 9.7  plts 208  Na 133  Cl 96 L  co2 23  Glucose 118  Total protein 5.9 or 6.9  AST 13,  ALT <9 L LDH 201  Ferritin 1500  Iron sat 42 normal  Iron 84 normal  TIBC 200 L UIBC 116   Calcium 8.4 L  Phos 7.3 H normal 2.4-5.1 Alk Phos 84  Calcium correct 8.6  K 4.8  Albumin 3.8  Creatinine 11.51 H     Dementia without behavioral disturbance, unspecified dementia type ([place]), tremor of note requip held due to did not help -on aricept 5 mg qd  -f/u Dr. Melrose Nakayama   HM Flu shot 2020 pt has not had and scared to get  pna 23utd Tetanus UTD 06/09/10  Hep  B immune had engerix as well 4 doses 2014-2015   Consider shingrix in futuredisc prev pt unsure if insurance will cover it   Pap neg 11/13/16 neg HPVs/p hysterectomy  mammo neg9/24/19referred mammogram sch 04/05/19  h/o multiple polypscolonoscopy 08/20/17 repeat in 3 years [facility] -EGD 03/11/18 neg gastritis  Former smoker x 25-30 years <1 ppd CTA chest 01/12/18 moderate b/l pleural effusions and CAD and aortic atherosclerosis, no nodules  Thyroid nodules/parathyroid mass  -reordered thyroid US since 2019  [place] 06/16/17 right upper thyroid nodule ? Parathyroid mass 1.1 cm and left mid nodule f/u annually -was sch 2/28/20but this was canceled consider resch in the future    Does not make urine   Called Dana RN [facility] [address] for copy of labs from  02/21/2019   Last A1C 4.87/8/19 with HD prior to that10/2018 5.6 per HD RN Last A1C 4.4 since last visit 03/2018 or 11/2019off DM meds since 2016  02/21/2019  Albumin 3.8 nl 4.0 sl low hbg 9.2 Calcium corrected 8.6  Phos 7.3 elevated nl 3-5.5  PTH 255 nl 01/17/19  K 4.8 nl 02/21/19  Glucose 118 02/21/19  A1C 12/22/18 4.4  02/15/18 total cohlesterol 175       Labs HD 05/17/18 Albumin 3.8, hbg 11.1, Calcium 8.2, phose 6.4 elevated nl 3.0-5.5 , PTH 534 nl 150-600, K 4.0 nl, vitamin D 7 (02/22/16)  Lipid 02/15/18 TC 175 Glucose 05/17/18 128 A1C 03/22/18 4.4     -we discussed possible serious and likely etiologies, options for evaluation and workup, limitations of telemedicine visit vs in person visit, treatment, treatment risks and precautions. Pt prefers to treat via telemedicine empirically rather then risking or undertaking an in person visit at this moment. Patient agrees to seek prompt in person care if worsening, new symptoms arise, or if is not improving with treatment.   I discussed the assessment and treatment plan with the patient. The patient was provided an opportunity to ask questions and all were answered. The patient agreed with the plan and demonstrated an understanding of the instructions.   The patient was advised to call back or seek an in-person evaluation if the symptoms worsen or if the condition fails to improve as anticipated.  Time spent 25 minutes  Delorise Jackson, MD

## 2019-02-25 ENCOUNTER — Telehealth: Payer: Self-pay | Admitting: Internal Medicine

## 2019-02-25 DIAGNOSIS — Z992 Dependence on renal dialysis: Secondary | ICD-10-CM | POA: Diagnosis not present

## 2019-02-25 DIAGNOSIS — D631 Anemia in chronic kidney disease: Secondary | ICD-10-CM | POA: Diagnosis not present

## 2019-02-25 DIAGNOSIS — N2581 Secondary hyperparathyroidism of renal origin: Secondary | ICD-10-CM | POA: Diagnosis not present

## 2019-02-25 DIAGNOSIS — D509 Iron deficiency anemia, unspecified: Secondary | ICD-10-CM | POA: Diagnosis not present

## 2019-02-25 DIAGNOSIS — N186 End stage renal disease: Secondary | ICD-10-CM | POA: Diagnosis not present

## 2019-02-25 NOTE — Telephone Encounter (Signed)
Pt dropped off a supplementary Claim Disability benefit form to be completed by Dr. Olivia Mackie. Form is up front in color folder.

## 2019-02-25 NOTE — Telephone Encounter (Signed)
Given to provider

## 2019-02-28 DIAGNOSIS — D509 Iron deficiency anemia, unspecified: Secondary | ICD-10-CM | POA: Diagnosis not present

## 2019-02-28 DIAGNOSIS — Z992 Dependence on renal dialysis: Secondary | ICD-10-CM | POA: Diagnosis not present

## 2019-02-28 DIAGNOSIS — N186 End stage renal disease: Secondary | ICD-10-CM | POA: Diagnosis not present

## 2019-02-28 DIAGNOSIS — N2581 Secondary hyperparathyroidism of renal origin: Secondary | ICD-10-CM | POA: Diagnosis not present

## 2019-02-28 DIAGNOSIS — F0390 Unspecified dementia without behavioral disturbance: Secondary | ICD-10-CM | POA: Insufficient documentation

## 2019-02-28 DIAGNOSIS — R937 Abnormal findings on diagnostic imaging of other parts of musculoskeletal system: Secondary | ICD-10-CM | POA: Insufficient documentation

## 2019-02-28 DIAGNOSIS — D631 Anemia in chronic kidney disease: Secondary | ICD-10-CM | POA: Diagnosis not present

## 2019-03-02 DIAGNOSIS — D509 Iron deficiency anemia, unspecified: Secondary | ICD-10-CM | POA: Diagnosis not present

## 2019-03-02 DIAGNOSIS — Z992 Dependence on renal dialysis: Secondary | ICD-10-CM | POA: Diagnosis not present

## 2019-03-02 DIAGNOSIS — N2581 Secondary hyperparathyroidism of renal origin: Secondary | ICD-10-CM | POA: Diagnosis not present

## 2019-03-02 DIAGNOSIS — D631 Anemia in chronic kidney disease: Secondary | ICD-10-CM | POA: Diagnosis not present

## 2019-03-02 DIAGNOSIS — N186 End stage renal disease: Secondary | ICD-10-CM | POA: Diagnosis not present

## 2019-03-02 NOTE — Progress Notes (Signed)
Notes have been sent electronically.

## 2019-03-03 ENCOUNTER — Ambulatory Visit (INDEPENDENT_AMBULATORY_CARE_PROVIDER_SITE_OTHER): Payer: Medicare Other | Admitting: Nurse Practitioner

## 2019-03-03 ENCOUNTER — Other Ambulatory Visit (INDEPENDENT_AMBULATORY_CARE_PROVIDER_SITE_OTHER): Payer: Self-pay | Admitting: Vascular Surgery

## 2019-03-03 ENCOUNTER — Encounter (INDEPENDENT_AMBULATORY_CARE_PROVIDER_SITE_OTHER): Payer: Medicare Other

## 2019-03-03 DIAGNOSIS — Z95820 Peripheral vascular angioplasty status with implants and grafts: Secondary | ICD-10-CM

## 2019-03-03 DIAGNOSIS — N186 End stage renal disease: Secondary | ICD-10-CM

## 2019-03-04 DIAGNOSIS — N2581 Secondary hyperparathyroidism of renal origin: Secondary | ICD-10-CM | POA: Diagnosis not present

## 2019-03-04 DIAGNOSIS — N186 End stage renal disease: Secondary | ICD-10-CM | POA: Diagnosis not present

## 2019-03-04 DIAGNOSIS — D509 Iron deficiency anemia, unspecified: Secondary | ICD-10-CM | POA: Diagnosis not present

## 2019-03-04 DIAGNOSIS — D631 Anemia in chronic kidney disease: Secondary | ICD-10-CM | POA: Diagnosis not present

## 2019-03-04 DIAGNOSIS — Z992 Dependence on renal dialysis: Secondary | ICD-10-CM | POA: Diagnosis not present

## 2019-03-09 ENCOUNTER — Telehealth: Payer: Self-pay | Admitting: Internal Medicine

## 2019-03-09 DIAGNOSIS — N2581 Secondary hyperparathyroidism of renal origin: Secondary | ICD-10-CM | POA: Diagnosis not present

## 2019-03-09 DIAGNOSIS — N186 End stage renal disease: Secondary | ICD-10-CM | POA: Diagnosis not present

## 2019-03-09 DIAGNOSIS — D509 Iron deficiency anemia, unspecified: Secondary | ICD-10-CM | POA: Diagnosis not present

## 2019-03-09 DIAGNOSIS — Z992 Dependence on renal dialysis: Secondary | ICD-10-CM | POA: Diagnosis not present

## 2019-03-09 DIAGNOSIS — D631 Anemia in chronic kidney disease: Secondary | ICD-10-CM | POA: Diagnosis not present

## 2019-03-09 NOTE — Telephone Encounter (Signed)
-----   Message from Delorise Jackson, MD sent at 02/28/2019 12:10 PM EDT ----- F/u in 4 months

## 2019-03-09 NOTE — Telephone Encounter (Signed)
I called pt and left a vm to call ofc to F/u in 4 months

## 2019-03-10 DIAGNOSIS — N186 End stage renal disease: Secondary | ICD-10-CM | POA: Diagnosis not present

## 2019-03-10 DIAGNOSIS — N2581 Secondary hyperparathyroidism of renal origin: Secondary | ICD-10-CM | POA: Diagnosis not present

## 2019-03-10 DIAGNOSIS — D631 Anemia in chronic kidney disease: Secondary | ICD-10-CM | POA: Diagnosis not present

## 2019-03-10 DIAGNOSIS — D509 Iron deficiency anemia, unspecified: Secondary | ICD-10-CM | POA: Diagnosis not present

## 2019-03-10 DIAGNOSIS — Z992 Dependence on renal dialysis: Secondary | ICD-10-CM | POA: Diagnosis not present

## 2019-03-11 DIAGNOSIS — Z0279 Encounter for issue of other medical certificate: Secondary | ICD-10-CM

## 2019-03-11 DIAGNOSIS — D631 Anemia in chronic kidney disease: Secondary | ICD-10-CM | POA: Diagnosis not present

## 2019-03-11 DIAGNOSIS — N186 End stage renal disease: Secondary | ICD-10-CM | POA: Diagnosis not present

## 2019-03-11 DIAGNOSIS — N2581 Secondary hyperparathyroidism of renal origin: Secondary | ICD-10-CM | POA: Diagnosis not present

## 2019-03-11 DIAGNOSIS — Z992 Dependence on renal dialysis: Secondary | ICD-10-CM | POA: Diagnosis not present

## 2019-03-11 DIAGNOSIS — D509 Iron deficiency anemia, unspecified: Secondary | ICD-10-CM | POA: Diagnosis not present

## 2019-03-14 ENCOUNTER — Encounter: Payer: Self-pay | Admitting: Internal Medicine

## 2019-03-14 ENCOUNTER — Ambulatory Visit: Admission: RE | Admit: 2019-03-14 | Payer: Medicare Other | Source: Ambulatory Visit

## 2019-03-14 DIAGNOSIS — D631 Anemia in chronic kidney disease: Secondary | ICD-10-CM | POA: Diagnosis not present

## 2019-03-14 DIAGNOSIS — N2581 Secondary hyperparathyroidism of renal origin: Secondary | ICD-10-CM | POA: Diagnosis not present

## 2019-03-14 DIAGNOSIS — R69 Illness, unspecified: Secondary | ICD-10-CM | POA: Insufficient documentation

## 2019-03-14 DIAGNOSIS — Z992 Dependence on renal dialysis: Secondary | ICD-10-CM | POA: Diagnosis not present

## 2019-03-14 DIAGNOSIS — D509 Iron deficiency anemia, unspecified: Secondary | ICD-10-CM | POA: Diagnosis not present

## 2019-03-14 DIAGNOSIS — N186 End stage renal disease: Secondary | ICD-10-CM | POA: Diagnosis not present

## 2019-03-16 DIAGNOSIS — N2581 Secondary hyperparathyroidism of renal origin: Secondary | ICD-10-CM | POA: Diagnosis not present

## 2019-03-16 DIAGNOSIS — N186 End stage renal disease: Secondary | ICD-10-CM | POA: Diagnosis not present

## 2019-03-16 DIAGNOSIS — Z992 Dependence on renal dialysis: Secondary | ICD-10-CM | POA: Diagnosis not present

## 2019-03-16 DIAGNOSIS — D509 Iron deficiency anemia, unspecified: Secondary | ICD-10-CM | POA: Diagnosis not present

## 2019-03-16 DIAGNOSIS — D631 Anemia in chronic kidney disease: Secondary | ICD-10-CM | POA: Diagnosis not present

## 2019-03-17 DIAGNOSIS — M5416 Radiculopathy, lumbar region: Secondary | ICD-10-CM | POA: Diagnosis not present

## 2019-03-17 DIAGNOSIS — G8929 Other chronic pain: Secondary | ICD-10-CM | POA: Diagnosis not present

## 2019-03-17 DIAGNOSIS — M5441 Lumbago with sciatica, right side: Secondary | ICD-10-CM | POA: Diagnosis not present

## 2019-03-18 ENCOUNTER — Other Ambulatory Visit: Payer: Self-pay | Admitting: Student

## 2019-03-18 DIAGNOSIS — M5441 Lumbago with sciatica, right side: Secondary | ICD-10-CM

## 2019-03-18 DIAGNOSIS — N2581 Secondary hyperparathyroidism of renal origin: Secondary | ICD-10-CM | POA: Diagnosis not present

## 2019-03-18 DIAGNOSIS — D509 Iron deficiency anemia, unspecified: Secondary | ICD-10-CM | POA: Diagnosis not present

## 2019-03-18 DIAGNOSIS — Z992 Dependence on renal dialysis: Secondary | ICD-10-CM | POA: Diagnosis not present

## 2019-03-18 DIAGNOSIS — M5416 Radiculopathy, lumbar region: Secondary | ICD-10-CM

## 2019-03-18 DIAGNOSIS — G8929 Other chronic pain: Secondary | ICD-10-CM

## 2019-03-18 DIAGNOSIS — N186 End stage renal disease: Secondary | ICD-10-CM | POA: Diagnosis not present

## 2019-03-18 DIAGNOSIS — D631 Anemia in chronic kidney disease: Secondary | ICD-10-CM | POA: Diagnosis not present

## 2019-03-21 DIAGNOSIS — N186 End stage renal disease: Secondary | ICD-10-CM | POA: Diagnosis not present

## 2019-03-21 DIAGNOSIS — Z992 Dependence on renal dialysis: Secondary | ICD-10-CM | POA: Diagnosis not present

## 2019-03-21 DIAGNOSIS — D631 Anemia in chronic kidney disease: Secondary | ICD-10-CM | POA: Diagnosis not present

## 2019-03-21 DIAGNOSIS — E119 Type 2 diabetes mellitus without complications: Secondary | ICD-10-CM | POA: Diagnosis not present

## 2019-03-21 DIAGNOSIS — D509 Iron deficiency anemia, unspecified: Secondary | ICD-10-CM | POA: Diagnosis not present

## 2019-03-21 DIAGNOSIS — N2581 Secondary hyperparathyroidism of renal origin: Secondary | ICD-10-CM | POA: Diagnosis not present

## 2019-03-23 DIAGNOSIS — D631 Anemia in chronic kidney disease: Secondary | ICD-10-CM | POA: Diagnosis not present

## 2019-03-23 DIAGNOSIS — Z992 Dependence on renal dialysis: Secondary | ICD-10-CM | POA: Diagnosis not present

## 2019-03-23 DIAGNOSIS — D509 Iron deficiency anemia, unspecified: Secondary | ICD-10-CM | POA: Diagnosis not present

## 2019-03-23 DIAGNOSIS — N186 End stage renal disease: Secondary | ICD-10-CM | POA: Diagnosis not present

## 2019-03-23 DIAGNOSIS — N2581 Secondary hyperparathyroidism of renal origin: Secondary | ICD-10-CM | POA: Diagnosis not present

## 2019-03-24 ENCOUNTER — Ambulatory Visit (INDEPENDENT_AMBULATORY_CARE_PROVIDER_SITE_OTHER): Payer: Medicare Other | Admitting: Nurse Practitioner

## 2019-03-24 ENCOUNTER — Ambulatory Visit (INDEPENDENT_AMBULATORY_CARE_PROVIDER_SITE_OTHER): Payer: Medicare Other

## 2019-03-24 ENCOUNTER — Encounter (INDEPENDENT_AMBULATORY_CARE_PROVIDER_SITE_OTHER): Payer: Self-pay

## 2019-03-24 ENCOUNTER — Other Ambulatory Visit: Payer: Self-pay

## 2019-03-24 ENCOUNTER — Encounter (INDEPENDENT_AMBULATORY_CARE_PROVIDER_SITE_OTHER): Payer: Self-pay | Admitting: Nurse Practitioner

## 2019-03-24 VITALS — BP 148/81 | HR 69 | Resp 16 | Wt 170.8 lb

## 2019-03-24 DIAGNOSIS — Z992 Dependence on renal dialysis: Secondary | ICD-10-CM

## 2019-03-24 DIAGNOSIS — E782 Mixed hyperlipidemia: Secondary | ICD-10-CM

## 2019-03-24 DIAGNOSIS — I10 Essential (primary) hypertension: Secondary | ICD-10-CM

## 2019-03-24 DIAGNOSIS — N186 End stage renal disease: Secondary | ICD-10-CM

## 2019-03-24 DIAGNOSIS — Z95820 Peripheral vascular angioplasty status with implants and grafts: Secondary | ICD-10-CM

## 2019-03-24 DIAGNOSIS — J432 Centrilobular emphysema: Secondary | ICD-10-CM

## 2019-03-24 NOTE — Progress Notes (Signed)
SUBJECTIVE:  Patient ID: Theresa [name], female    DOB: [birth year], 63 y.o.   MRN: [id number] Chief Complaint  Patient presents with  . Follow-up    [facility] 3week HDA    HPI  Theresa [name] is a 63 y.o. female The patient returns to the office for followup status post intervention of the dialysis access left forearm loop graft. Following the intervention the access function has significantly improved, with better flow rates and improved KT/V. The patient has not been experiencing increased bleeding times following decannulation and the patient denies increased recirculation. The patient denies an increase in arm swelling. At the present time the patient denies hand pain.  The patient denies amaurosis fugax or recent TIA symptoms. There are no recent neurological changes noted. The patient denies claudication symptoms or rest pain symptoms. The patient denies history of DVT, PE or superficial thrombophlebitis. The patient denies recent episodes of angina or shortness of breath.   The patient has a flow volume of 2027, increased from the previous flow volume of 1005 on 02/01/2019.  There is a change in diameter at the mid graft however it is improved from previous studies.  The left upper arm loop AV graft appears to be patent throughout.     Past Medical History:  Diagnosis Date  . Anemia of chronic disease   . Anginal pain ([place])   . Carotid arterial disease ([place])    a. 02/2013 U/S: 40-59% bilat ICA stenosis.  . Chronic constipation   . Chronic diastolic CHF (congestive heart failure) ([place])    a. 10/2013 Echo [facility]): EF 55-60%, mod conc LVH, mod MR, mildly dil LA, mild Ao sclerosis w/o stenosis; b. 02/2016 Echo: EF 55-60%, no rwma, Gr DD, mild AS, mod to sev MR, mildly dil LA, PASP 75mmHg; c. 07/2017 Echo [facility]): EF >55%, mild to mod LVH, Gr2 DD, Ao scl, Mod MR, mod dil LA, nl RV fxn.  . Colon polyps   . COPD (chronic obstructive pulmonary disease) ([place])   . Coronary artery disease    a.  05/2013 NSTEMI/PCI: LM 20d, LAD min irregs, LCX small, nl, OM1 nl, RCA dom 64m (2.5x16 Promus DES), PDA1 80p; b. 04/2015 Cath: LM nl, LAD 41m, D1/2 min irregs, LCX 35p/m, OM2/3 min irregs, RCA patent mid stent, RPDA 70ost, RPLB1 30, RPLB2/3 min irregs, EF 55-65%--> Med Rx; c. 06/2017 [facility]): No ischemia/infarct, EF 64%.  . Diabetes mellitus   . Diabetic neuropathy ([place])   . Diabetic retinopathy ([place]) 05/28/2013   Hx bilat retinal detachment, proliferative diab retinopathy and bilat vitreous hemorrhage   . Emphysema   . ESRD on hemodialysis ([place])    a. [facility] in [place], [place] [phone]/Dr. [name], on a MWF schedule.  She started dialysis in Feb 2014.  Etiology of renal failure not known, likely diabetes.  Has a left upper arm AV graft.  . History of bronchitis    Mar 2012  . History of pneumonia    June 2012  . History of tobacco abuse    a. Quit 2012.  Marland Kitchen Hyperlipidemia   . Hypertension   . Moderate to severe mitral insufficiency    a. 10/2013 Echo: EF 55-60%, mod MR; b. 02/2016 Echo: EF 55-60%, mod to sev MR directed posteriorly--felt to be dynamic -worse with volume overload; c. 07/2017 Echo [facility]): EF >55%. Mod MR.  . Myocardial infarct ([place]) 05/2013  . Peripheral vascular disease ([place])   . Sickle cell trait ([place])   . Thyroid  nodule    [place] 06/2017 due to f/u US in 1 year     Past Surgical History:  Procedure Laterality Date  . A/V FISTULAGRAM Left 11/10/2017   Procedure: A/V FISTULAGRAM;  Surgeon: Katha Cabal, MD;  Location: [place] CV [facility];  Service: Cardiovascular;  Laterality: Left;  . A/V SHUNTOGRAM Left 02/08/2019   Procedure: A/V SHUNTOGRAM;  Surgeon: Katha Cabal, MD;  Location: [place] [facility];  Service: Cardiovascular;  Laterality: Left;  . ABDOMINAL HYSTERECTOMY     2000  . CARDIAC CATHETERIZATION    . CARDIAC CATHETERIZATION N/A 05/02/2015   Procedure: Left Heart Cath and Coronary Angiography;  Surgeon: [name], MD;  Location: [place]  CV LAB;  Service: Cardiovascular;  Laterality: N/A;  . COLONOSCOPY WITH PROPOFOL N/A 07/08/2016   Procedure: COLONOSCOPY WITH PROPOFOL;  Surgeon: [name], MD;  Location: [facility] ENDOSCOPY;  Service: Endoscopy;  Laterality: N/A;  . COLONOSCOPY WITH PROPOFOL N/A 08/20/2017   Procedure: COLONOSCOPY WITH PROPOFOL;  Surgeon: [name], MD;  Location: [facility] ENDOSCOPY;  Service: Gastroenterology;  Laterality: N/A;  . colonscopy    . CORONARY ANGIOPLASTY  05/28/2014   stent placement to the mid RCA  . DILATION AND CURETTAGE OF UTERUS     several in the early 80's  . ESOPHAGOGASTRODUODENOSCOPY     2012  . ESOPHAGOGASTRODUODENOSCOPY (EGD) WITH PROPOFOL N/A 03/11/2018   Procedure: ESOPHAGOGASTRODUODENOSCOPY (EGD) WITH PROPOFOL;  Surgeon: [name], MD;  Location: [facility] ENDOSCOPY;  Service: Gastroenterology;  Laterality: N/A;  . EYE SURGERY     bilateral laser 2012  . EYE SURGERY     right  . EYE SURGERY     x4 both eyes  . GAS INSERTION  09/30/2011   Procedure: INSERTION OF GAS;  Surgeon: [name], MD;  Location: [place];  Service: Ophthalmology;  Laterality: Right;  [id number]  . GAS/FLUID EXCHANGE  09/30/2011   Procedure: GAS/FLUID EXCHANGE;  Surgeon: [name], MD;  Location: [place];  Service: Ophthalmology;  Laterality: Right;  . LEFT HEART CATHETERIZATION WITH CORONARY ANGIOGRAM N/A 05/28/2013   Procedure: LEFT HEART CATHETERIZATION WITH CORONARY ANGIOGRAM;  Surgeon: [name], MD;  Location: [facility] CATH LAB;  Service: Cardiovascular;  Laterality: N/A;  . PARS PLANA VITRECTOMY  04/22/2011   Procedure: PARS PLANA VITRECTOMY WITH 25 GAUGE;  Surgeon: [name], MD;  Location: [place];  Service: Ophthalmology;  Laterality: Left;  membrane peel, endolaser, gas fluid exchange, silicone oil, repair of complex traction retinal detachment  . PARS PLANA VITRECTOMY  09/30/2011   Procedure: PARS PLANA VITRECTOMY WITH 25 GAUGE;  Surgeon: [name], MD;  Location: [place];  Service:  Ophthalmology;  Laterality: Right;  Endolaser; Repair of Complex Traction Retinal Detachment  . PARS PLANA VITRECTOMY  02/24/2012   Procedure: PARS PLANA VITRECTOMY WITH 25 GAUGE;  Surgeon: [name], MD;  Location: [place];  Service: Ophthalmology;  Laterality: Left;  . PTCA    . SILICON OIL REMOVAL  01/19/7516   Procedure: SILICON OIL REMOVAL;  Surgeon: [name], MD;  Location: [place];  Service: Ophthalmology;  Laterality: Left;  . THROMBECTOMY / ARTERIOVENOUS GRAFT REVISION    . TUBAL LIGATION     1979  . UPPER EXTREMITY ANGIOGRAPHY Left 02/08/2019   Procedure: UPPER EXTREMITY ANGIOGRAPHY;  Surgeon: Katha Cabal, MD;  Location: [place] CV LAB;  Service: Cardiovascular;  Laterality: Left;    Social History   Socioeconomic History  . Marital status:  Single    Spouse name: Not on file  . Number of children: 2  . Years of education: Not on file  . Highest education level: Not on file  Occupational History  . Not on file  Social Needs  . Financial resource strain: Not on file  . Food insecurity    Worry: Not on file    Inability: Not on file  . Transportation needs    Medical: Not on file    Non-medical: Not on file  Tobacco Use  . Smoking status: Former Smoker    Packs/day: 1.00    Years: 25.00    Pack years: 25.00    Quit date: 06/09/2010    Years since quitting: 8.7  . Smokeless tobacco: Never Used  Substance and Sexual Activity  . Alcohol use: No  . Drug use: No  . Sexual activity: Never  Lifestyle  . Physical activity    Days per week: Not on file    Minutes per session: Not on file  . Stress: Not on file  Relationships  . Social Herbalist on phone: Not on file    Gets together: Not on file    Attends religious service: Not on file    Active member of club or organization: Not on file    Attends meetings of clubs or organizations: Not on file    Relationship status: Not on file  . Intimate partner violence    Fear of current or ex  partner: Not on file    Emotionally abused: Not on file    Physically abused: Not on file    Forced sexual activity: Not on file  Other Topics Concern  . Not on file  Social History Narrative   On disability.    Lives with son Brenton Grills   2 children (has son and daughter)      Son drives her since her vision has decreased    Family History  Problem Relation Age of Onset  . Stroke Mother   . Heart attack Mother   . Heart disease Mother   . Colon cancer Father   . Colon cancer Sister   . Heart attack Brother   . Stroke Brother   . Diabetes Brother   . Breast cancer Sister   . Diabetes Brother   . Diabetes Daughter   . Renal Disease Daughter        on HD  . Anesthesia problems Neg Hx   . Hypotension Neg Hx   . Malignant hyperthermia Neg Hx   . Pseudochol deficiency Neg Hx     Allergies  Allergen Reactions  . Hydrocodone Hives  . Metformin Diarrhea and Other (See Comments)    Other reaction(s): Distress (finding)  . Lisinopril     Unknown reaction      Review of Systems   Review of Systems: Negative Unless Checked Constitutional: [] Weight loss  [] Fever  [] Chills Cardiac: [] Chest pain   []  Atrial Fibrillation  [] Palpitations   [] Shortness of breath when laying flat   [] Shortness of breath with exertion. [] Shortness of breath at rest Vascular:  [] Pain in legs with walking   [] Pain in legs with standing [] Pain in legs when laying flat   [] Claudication    [] Pain in feet when laying flat    [] History of DVT   [] Phlebitis   [] Swelling in legs   [] Varicose veins   [] Non-healing ulcers Pulmonary:   [] Uses home oxygen   [] Productive cough   [] Hemoptysis   []   Wheeze  [x] COPD   [] Asthma Neurologic:  [] Dizziness   [] Seizures  [] Blackouts [] History of stroke   [] History of TIA  [] Aphasia   [] Temporary Blindness   [] Weakness or numbness in arm   [] Weakness or numbness in leg Musculoskeletal:   [] Joint swelling   [] Joint pain   [] Low back pain  []  History of Knee Replacement  [] Arthritis [] back Surgeries  []  Spinal Stenosis    Hematologic:  [] Easy bruising  [] Easy bleeding   [] Hypercoagulable state   [x] Anemic Gastrointestinal:  [] Diarrhea   [] Vomiting  [] Gastroesophageal reflux/heartburn   [] Difficulty swallowing. [] Abdominal pain Genitourinary:  [x] Chronic kidney disease   [] Difficult urination  [] Anuric   [] Blood in urine [] Frequent urination  [] Burning with urination   [] Hematuria Skin:  [] Rashes   [] Ulcers [] Wounds Psychological:  [] History of anxiety   []  History of major depression  []  Memory Difficulties      OBJECTIVE:   Physical Exam  BP (!) 148/81 (BP Location: Right Arm)   Pulse 69   Resp 16   Wt 170 lb 12.8 oz (77.5 kg)   BMI 26.36 kg/m   Gen: WD/WN, NAD Head: [place]/AT, No temporalis wasting.  Ear/Nose/Throat: Hearing grossly intact, nares w/o erythema or drainage Eyes: PER, EOMI, sclera nonicteric.  Neck: Supple, no masses.  No JVD.  Pulmonary:  Good air movement, no use of accessory muscles.  Cardiac: RRR Vascular:  Good thrill and bruit Vessel Right Left  Radial Palpable Palpable   Gastrointestinal: soft, non-distended. No guarding/no peritoneal signs.  Musculoskeletal: M/S 5/5 throughout.  No deformity or atrophy.  Neurologic: Pain and light touch intact in extremities.  Symmetrical.  Speech is fluent. Motor exam as listed above. Psychiatric: Judgment intact, Mood & affect appropriate for pt's clinical situation. Dermatologic: No Venous rashes. No Ulcers Noted.  No changes consistent with cellulitis. Lymph : No Cervical lymphadenopathy, no lichenification or skin changes of chronic lymphedema.       ASSESSMENT AND PLAN:  1. ESRD on hemodialysis [facility]) Recommend:  The patient is doing well and currently has adequate dialysis access. The patient's dialysis center is not reporting any access issues. Flow pattern is stable when compared to the prior ultrasound.  The patient should have a duplex ultrasound of the dialysis access in  6 months. The patient will follow-up with me in the office after each ultrasound     2. Mixed hyperlipidemia Continue statin as ordered and reviewed, no changes at this time   3. Centrilobular emphysema (HCC) Continue pulmonary medications and aerosols as already ordered, these medications have been reviewed and there are no changes at this time.    4. Essential (primary) hypertension Continue antihypertensive medications as already ordered, these medications have been reviewed and there are no changes at this time.    Current Outpatient Medications on File Prior to Visit  Medication Sig Dispense Refill  . albuterol (PROVENTIL) (2.5 MG/3ML) 0.083% nebulizer solution Take 3 mLs (2.5 mg total) by nebulization every 6 (six) hours as needed for wheezing or shortness of breath. 75 mL 11  . amLODipine (NORVASC) 10 MG tablet Take 10 mg by mouth daily.     Marland Kitchen aspirin EC 81 MG tablet Take 1 tablet (81 mg total) by mouth at bedtime. 30 tablet 11  . AURYXIA 1 GM 210 MG(Fe) tablet Take 210-630 mg by mouth 3 (three) times daily with meals. Take 630 mg with each meal and 210 mg with each snack  12  . carvedilol (COREG) 12.5 MG tablet Take 1.5 tablets (  18.75 mg) by mouth twice daily (Patient taking differently: Take 12.5-25 mg by mouth See admin instructions. 12.5 mg in the morning, and 6.25 mg in the evening) 270 tablet 3  . cetirizine (ZYRTEC) 5 MG tablet Take 1 tablet (5 mg total) by mouth daily as needed for allergies. (Patient taking differently: Take 5 mg by mouth daily. ) 90 tablet 3  . cinacalcet (SENSIPAR) 90 MG tablet Take 90 mg by mouth daily.     . cloNIDine (CATAPRES) 0.1 MG tablet Take 0.1 mg by mouth 2 (two) times daily.    Marland Kitchen donepezil (ARICEPT) 5 MG tablet Take 5 mg by mouth daily.    . furosemide (LASIX) 80 MG tablet Take 1 tablet (80 mg total) by mouth See admin instructions. Take 80 mg by mouth once daily on Sun, Tue, Thur, and Sat (non dialysis days) 48 tablet 3  . losartan  (COZAAR) 50 MG tablet Take 50 mg by mouth daily.     . megestrol (MEGACE) 20 MG tablet Take 10 mg by mouth daily. Dr. Inocente Salles    . multivitamin (RENA-VIT) TABS tablet Take 1 tablet by mouth daily.    . nitroGLYCERIN (NITROSTAT) 0.4 MG SL tablet Place 1 tablet (0.4 mg total) under the tongue every 5 (five) minutes as needed for chest pain. 25 tablet 3  . ondansetron (ZOFRAN) 4 MG tablet Take 1 tablet (4 mg total) by mouth every 8 (eight) hours as needed for nausea or vomiting. 30 tablet 2  . pregabalin (LYRICA) 75 MG capsule Take 1 capsule (75 mg total) by mouth daily. 90 capsule 1  . UNABLE TO FIND TEST BLOOD SUGARS 2 TIMES DAILY    . albuterol (VENTOLIN HFA) 108 (90 Base) MCG/ACT inhaler Inhale 1-2 puffs into the lungs every 6 (six) hours as needed for wheezing or shortness of breath. (Patient not taking: Reported on 02/28/2019) 1 Inhaler 12  . isosorbide mononitrate (IMDUR) 60 MG 24 hr tablet Take 1 tablet (60 mg total) by mouth daily. (Patient not taking: Reported on 02/28/2019) 30 tablet 11   Current Facility-Administered Medications on File Prior to Visit  Medication Dose Route Frequency Provider Last Rate Last Dose  . betamethasone acetate-betamethasone sodium phosphate (CELESTONE) injection 3 mg  3 mg Intramuscular Once Edrick Kins, DPM        There are no Patient Instructions on file for this visit. No follow-ups on file.   Kris Hartmann, NP  This note was completed with Sales executive.  Any errors are purely unintentional.

## 2019-03-25 DIAGNOSIS — Z992 Dependence on renal dialysis: Secondary | ICD-10-CM | POA: Diagnosis not present

## 2019-03-25 DIAGNOSIS — D509 Iron deficiency anemia, unspecified: Secondary | ICD-10-CM | POA: Diagnosis not present

## 2019-03-25 DIAGNOSIS — D631 Anemia in chronic kidney disease: Secondary | ICD-10-CM | POA: Diagnosis not present

## 2019-03-25 DIAGNOSIS — N186 End stage renal disease: Secondary | ICD-10-CM | POA: Diagnosis not present

## 2019-03-25 DIAGNOSIS — N2581 Secondary hyperparathyroidism of renal origin: Secondary | ICD-10-CM | POA: Diagnosis not present

## 2019-03-28 ENCOUNTER — Encounter: Admit: 2019-03-28 | Discharge: 2019-03-28 | Payer: MEDICARE | Attending: Surgery | Primary: Surgery

## 2019-03-28 ENCOUNTER — Encounter
Admit: 2019-03-28 | Discharge: 2019-03-28 | Payer: MEDICARE | Attending: Student in an Organized Health Care Education/Training Program | Primary: Student in an Organized Health Care Education/Training Program

## 2019-03-28 DIAGNOSIS — Z7682 Awaiting organ transplant status: Principal | ICD-10-CM

## 2019-03-28 DIAGNOSIS — I120 Hypertensive chronic kidney disease with stage 5 chronic kidney disease or end stage renal disease: Secondary | ICD-10-CM | POA: Diagnosis not present

## 2019-03-28 DIAGNOSIS — E871 Hypo-osmolality and hyponatremia: Secondary | ICD-10-CM | POA: Diagnosis not present

## 2019-03-28 DIAGNOSIS — I110 Hypertensive heart disease with heart failure: Secondary | ICD-10-CM | POA: Diagnosis not present

## 2019-03-28 DIAGNOSIS — Z6828 Body mass index (BMI) 28.0-28.9, adult: Secondary | ICD-10-CM | POA: Diagnosis not present

## 2019-03-28 DIAGNOSIS — Z20828 Contact with and (suspected) exposure to other viral communicable diseases: Secondary | ICD-10-CM | POA: Diagnosis not present

## 2019-03-28 DIAGNOSIS — N186 End stage renal disease: Secondary | ICD-10-CM | POA: Diagnosis not present

## 2019-03-28 DIAGNOSIS — I5032 Chronic diastolic (congestive) heart failure: Secondary | ICD-10-CM | POA: Diagnosis not present

## 2019-03-28 DIAGNOSIS — Z01818 Encounter for other preprocedural examination: Secondary | ICD-10-CM | POA: Diagnosis not present

## 2019-03-28 DIAGNOSIS — Z992 Dependence on renal dialysis: Secondary | ICD-10-CM | POA: Diagnosis not present

## 2019-03-28 DIAGNOSIS — Z940 Kidney transplant status: Secondary | ICD-10-CM | POA: Diagnosis not present

## 2019-03-28 DIAGNOSIS — D631 Anemia in chronic kidney disease: Secondary | ICD-10-CM | POA: Diagnosis not present

## 2019-03-28 DIAGNOSIS — E1122 Type 2 diabetes mellitus with diabetic chronic kidney disease: Secondary | ICD-10-CM | POA: Diagnosis not present

## 2019-03-28 DIAGNOSIS — T8619 Other complication of kidney transplant: Secondary | ICD-10-CM | POA: Diagnosis not present

## 2019-03-29 ENCOUNTER — Ambulatory Visit
Admit: 2019-03-29 | Discharge: 2019-04-06 | Disposition: A | Discharge status: Home Health | Payer: MEDICARE | Admitting: Student in an Organized Health Care Education/Training Program

## 2019-03-29 ENCOUNTER — Encounter
Admit: 2019-03-29 | Discharge: 2019-04-06 | Disposition: A | Discharge status: Home Health | Payer: MEDICARE | Attending: Student in an Organized Health Care Education/Training Program | Admitting: Student in an Organized Health Care Education/Training Program

## 2019-03-29 DIAGNOSIS — Z01810 Encounter for preprocedural cardiovascular examination: Secondary | ICD-10-CM | POA: Diagnosis not present

## 2019-03-29 DIAGNOSIS — K5730 Diverticulosis of large intestine without perforation or abscess without bleeding: Secondary | ICD-10-CM | POA: Diagnosis not present

## 2019-03-30 DIAGNOSIS — Z940 Kidney transplant status: Principal | ICD-10-CM

## 2019-03-30 DIAGNOSIS — I10 Essential (primary) hypertension: Secondary | ICD-10-CM | POA: Diagnosis not present

## 2019-03-30 DIAGNOSIS — D849 Immunodeficiency, unspecified: Secondary | ICD-10-CM | POA: Insufficient documentation

## 2019-03-30 DIAGNOSIS — R0602 Shortness of breath: Secondary | ICD-10-CM | POA: Diagnosis not present

## 2019-03-30 DIAGNOSIS — D631 Anemia in chronic kidney disease: Secondary | ICD-10-CM | POA: Diagnosis present

## 2019-03-30 DIAGNOSIS — I5032 Chronic diastolic (congestive) heart failure: Secondary | ICD-10-CM | POA: Diagnosis present

## 2019-03-30 DIAGNOSIS — Z7982 Long term (current) use of aspirin: Secondary | ICD-10-CM | POA: Diagnosis not present

## 2019-03-30 DIAGNOSIS — E1122 Type 2 diabetes mellitus with diabetic chronic kidney disease: Secondary | ICD-10-CM | POA: Diagnosis present

## 2019-03-30 DIAGNOSIS — E1165 Type 2 diabetes mellitus with hyperglycemia: Secondary | ICD-10-CM | POA: Diagnosis not present

## 2019-03-30 DIAGNOSIS — E785 Hyperlipidemia, unspecified: Secondary | ICD-10-CM | POA: Diagnosis present

## 2019-03-30 DIAGNOSIS — J449 Chronic obstructive pulmonary disease, unspecified: Secondary | ICD-10-CM | POA: Diagnosis present

## 2019-03-30 DIAGNOSIS — Z23 Encounter for immunization: Secondary | ICD-10-CM | POA: Diagnosis not present

## 2019-03-30 DIAGNOSIS — T8619 Other complication of kidney transplant: Secondary | ICD-10-CM | POA: Diagnosis not present

## 2019-03-30 DIAGNOSIS — Z5181 Encounter for therapeutic drug level monitoring: Secondary | ICD-10-CM | POA: Diagnosis not present

## 2019-03-30 DIAGNOSIS — E871 Hypo-osmolality and hyponatremia: Secondary | ICD-10-CM | POA: Diagnosis not present

## 2019-03-30 DIAGNOSIS — Z87891 Personal history of nicotine dependence: Secondary | ICD-10-CM | POA: Diagnosis not present

## 2019-03-30 DIAGNOSIS — Z79899 Other long term (current) drug therapy: Secondary | ICD-10-CM | POA: Diagnosis not present

## 2019-03-30 DIAGNOSIS — T380X5A Adverse effect of glucocorticoids and synthetic analogues, initial encounter: Secondary | ICD-10-CM | POA: Diagnosis not present

## 2019-03-30 DIAGNOSIS — K560 Paralytic ileus: Secondary | ICD-10-CM | POA: Diagnosis not present

## 2019-03-30 DIAGNOSIS — Z7902 Long term (current) use of antithrombotics/antiplatelets: Secondary | ICD-10-CM | POA: Diagnosis not present

## 2019-03-30 DIAGNOSIS — I2510 Atherosclerotic heart disease of native coronary artery without angina pectoris: Secondary | ICD-10-CM | POA: Diagnosis present

## 2019-03-30 DIAGNOSIS — R109 Unspecified abdominal pain: Secondary | ICD-10-CM | POA: Diagnosis not present

## 2019-03-30 DIAGNOSIS — R633 Feeding difficulties: Secondary | ICD-10-CM | POA: Diagnosis not present

## 2019-03-30 DIAGNOSIS — N186 End stage renal disease: Secondary | ICD-10-CM | POA: Diagnosis not present

## 2019-03-30 DIAGNOSIS — R112 Nausea with vomiting, unspecified: Secondary | ICD-10-CM | POA: Diagnosis not present

## 2019-03-30 DIAGNOSIS — E119 Type 2 diabetes mellitus without complications: Secondary | ICD-10-CM | POA: Diagnosis not present

## 2019-03-30 DIAGNOSIS — Z888 Allergy status to other drugs, medicaments and biological substances status: Secondary | ICD-10-CM | POA: Diagnosis not present

## 2019-03-30 DIAGNOSIS — Z4682 Encounter for fitting and adjustment of non-vascular catheter: Secondary | ICD-10-CM | POA: Diagnosis not present

## 2019-03-30 DIAGNOSIS — Z992 Dependence on renal dialysis: Secondary | ICD-10-CM | POA: Diagnosis not present

## 2019-03-30 DIAGNOSIS — J811 Chronic pulmonary edema: Secondary | ICD-10-CM | POA: Diagnosis not present

## 2019-03-30 DIAGNOSIS — N19 Unspecified kidney failure: Secondary | ICD-10-CM | POA: Diagnosis not present

## 2019-03-30 DIAGNOSIS — Z794 Long term (current) use of insulin: Secondary | ICD-10-CM | POA: Diagnosis not present

## 2019-03-30 DIAGNOSIS — I110 Hypertensive heart disease with heart failure: Secondary | ICD-10-CM | POA: Diagnosis present

## 2019-03-30 DIAGNOSIS — Z20828 Contact with and (suspected) exposure to other viral communicable diseases: Secondary | ICD-10-CM | POA: Diagnosis present

## 2019-03-31 ENCOUNTER — Encounter: Payer: Self-pay | Admitting: Internal Medicine

## 2019-03-31 ENCOUNTER — Ambulatory Visit: Payer: Medicare Other

## 2019-03-31 DIAGNOSIS — Z940 Kidney transplant status: Principal | ICD-10-CM

## 2019-03-31 DIAGNOSIS — Z79899 Other long term (current) drug therapy: Principal | ICD-10-CM

## 2019-03-31 MED ORDER — DOCUSATE SODIUM 100 MG CAPSULE: 100 mg | capsule | Freq: Two times a day (BID) | 0 refills | 30 days | Status: AC

## 2019-03-31 MED ORDER — TACROLIMUS ER 4 MG TABLET,EXTENDED RELEASE 24 HR: 12 mg | tablet | Freq: Every day | 11 refills | 30 days | Status: AC

## 2019-03-31 MED ORDER — OXYCODONE 5 MG TABLET: tablet | Freq: Four times a day (QID) | 0 refills | 5 days | Status: AC

## 2019-03-31 MED ORDER — TACROLIMUS ER 1 MG TABLET,EXTENDED RELEASE 24 HR: 3 mg | tablet | Freq: Every day | 11 refills | 30 days | Status: AC

## 2019-03-31 MED ORDER — FAMOTIDINE 20 MG TABLET: 20 mg | tablet | Freq: Every day | 11 refills | 30 days | Status: AC

## 2019-03-31 MED ORDER — AMLODIPINE 10 MG TABLET: 10 mg | tablet | Freq: Every day | 11 refills | 30 days | Status: AC

## 2019-03-31 MED ORDER — MG-PLUS-PROTEIN 133 MG TABLET: 1 | tablet | Freq: Two times a day (BID) | 11 refills | 0 days | Status: AC

## 2019-03-31 MED ORDER — POLYETHYLENE GLYCOL 3350 17 GRAM/DOSE ORAL POWDER: 17 g | g | Freq: Every day | 0 refills | 30 days | Status: AC

## 2019-03-31 MED ORDER — SULFAMETHOXAZOLE 400 MG-TRIMETHOPRIM 80 MG TABLET: 1 | tablet | 5 refills | 28 days | Status: AC

## 2019-03-31 MED ORDER — CARVEDILOL 12.5 MG TABLET: 13 mg | tablet | Freq: Two times a day (BID) | 3 refills | 90 days | Status: AC

## 2019-03-31 MED ORDER — VALGANCICLOVIR 450 MG TABLET: 450 mg | tablet | Freq: Every day | 2 refills | 30 days | Status: AC

## 2019-03-31 MED ORDER — ACETAMINOPHEN 500 MG TABLET: tablet | Freq: Four times a day (QID) | 0 refills | 4 days | Status: AC

## 2019-03-31 MED ORDER — MYCOPHENOLATE SODIUM 180 MG TABLET,DELAYED RELEASE: 540 mg | tablet | Freq: Two times a day (BID) | 11 refills | 30 days | Status: AC

## 2019-03-31 NOTE — Unmapped (Signed)
TRANSPLANT SURGERY PROGRESS NOTE    Admit Date: 03/28/2019, Hospital Day: 4  Hospital Service: Surg Transplant [facility])  Attending: Loney Hering, MD    Assessment Michele Cisneros is a 63 yo F w/ ESRD 2/2 T2DM admitted for DDKT on 10/21.      63 y.o. female, hx   Patient Active Problem List    Diagnosis Date Noted   ??? Status post deceased-donor kidney transplantation 03/30/2019     Priority: High   ??? End stage renal disease (CMS-HCC) 11/08/2012   ??? Type II or unspecified type diabetes mellitus 11/08/2012   ??? Essential hypertension 11/08/2012   ??? Other and unspecified hyperlipidemia 11/08/2012    1 Day Post-Op s/p DDKT.    Interval Events:  Went to OR yesterday for DDKT, no acute intraoperative complications.   NAEON. Denies nausea, vomiting. Has not passed flatus yet.     Plan   -oxy 5-10 PRN  - CLD  - HTN: amlodipine 10 mg, coreg 12.5  - anemia: transfuse 1u PRBCs followed by 60 IV lasix  - NS @ 50cc/hr    Disposition: Floor status.     Please page SRF [phone] with questions or concerns.      Subjective     No acute events overnight.     Objective     Vitals:   Temp:  [35.8 ??C-36.9 ??C] 36.9 ??C  Heart Rate:  [79-90] 90  SpO2 Pulse:  [80-90] 90  Resp:  [12-23] 23  BP: (131-196)/(38-83) 173/49  MAP (mmHg):  [78-110] 97  A BP-1: (168-185)/(37-51) 177/47  MAP:  [77 mmHg-95 mmHg] 88 mmHg  A BP-2: (172-209)/(45-58) 175/50  MAP:  [86 mmHg-108 mmHg] 96 mmHg  SpO2:  [96 %-100 %] 97 %    Intake/Output last 24 hours:  I/O       10/20 0701 - 10/21 0700 10/21 0701 - 10/22 0700 10/22 0701 - 10/23 0700    P.O. 640 0     I.V. (mL/kg)  4040 (51.8)     Blood  0     IV Piggyback  500     Total Intake 640 4540     Urine (mL/kg/hr) 0 (0) 281 (0.2) 35 (0.1)    Emesis/NG output  0     Drains  130 20    Other       Stool 0 0     Blood  250     Total Output(mL/kg) 0 (0) 661 (8.5) 55 (0.7)    Net +640 +3879 -55           Urine Occurrence  0 x     Stool Occurrence 1 x 0 x           Physical Exam: -General:  Appropriate, comfortable and in no apparent distress.   -Neurological: Alert and oriented x3. Moves all 4 extremities spontaneously.   -Cardiovascular: Regular rate and rhythm.  -Pulmonary: Normal work of breathing. No accessory muscle use.  -Abdomen: Soft, appropriately tender, non-distended. No rebound or guarding. Drains in place with SS fluid output  -Genitourinary: Foley in place.   -Extremities: Warm, well perfused, normal skin turgor.    -----------------------------------------------------    Data Review:  Lab Results   Component Value Date    WBC 17.9 (H) 03/31/2019    HGB 6.9 (L) 03/31/2019    HCT 21.2 (L) 03/31/2019    PLT 120 (L) 03/31/2019       Lab Results   Component Value Date    NA  134 (L) 03/31/2019    K 5.3 (H) 03/31/2019    CL 98 03/31/2019    CO2 25.0 03/31/2019    BUN 33 (H) 03/31/2019    CREATININE 7.68 (H) 03/31/2019    GLU 117 03/31/2019    CALCIUM 8.1 (L) 03/31/2019    MG 2.1 03/31/2019    PHOS 6.2 (H) 03/31/2019       Lab Results   Component Value Date    BILITOT 0.7 03/28/2019    BILIDIR 0.50 (H) 07/07/2017    PROT 6.7 03/28/2019    ALBUMIN 4.1 03/28/2019    ALT 10 03/28/2019    AST 20 03/28/2019    ALKPHOS 96 03/28/2019    GGT 11 03/28/2019       Lab Results   Component Value Date    LABPROT 12.1 04/20/2014    INR 0.93 03/28/2019    APTT >400.0 (HH) 03/28/2019         Imaging:  EKG 12 lead  NORMAL SINUS RHYTHM  PROLONGED QT  ABNORMAL ECG  WHEN COMPARED WITH ECG OF 28-Mar-2019 23:29,  NO SIGNIFICANT CHANGE WAS FOUND  US Renal Transplant W Doppler  Narrative: EXAM: US RENAL TRANSPLANT W DOPPLER  DATE: 03/30/2019 4:25 PM  ACCESSION: [id number]  DICTATED: 03/30/2019 4:29 PM  INTERPRETATION LOCATION: [facility]    CLINICAL INDICATION: 63 years old Female with post transplant     COMPARISON: CT abdomen and pelvis on 03/29/2019 and renal ultrasound on 07/07/2017. TECHNIQUE:  Ultrasound views of the renal transplant were obtained using gray scale and color and spectral Doppler imaging.     FINDINGS:    TRANSPLANTED KIDNEY: The renal transplant was located in the left lower quadrant. Normal size and echogenicity.  No solid masses or calculi. There is a perinephric hypoechoic fluid collection abutting the lower pole measuring approximately 1.4 x 1.8 x 1.0 cm. No hydronephrosis.    VESSELS:   - Perfusion: Using power Doppler, normal perfusion was seen throughout the renal parenchyma.  -Elevated resistive index in the main renal artery anastomosis. Remaining resistive indices in the renal transplant are within normal limits.  - Main renal artery/iliac artery: Patent  - Main renal vein/iliac vein: Limited evaluation, but patent. Patent iliac vein.    BLADDER: Not well visualized.  Impression: -Elevated resistive index in the main renal artery anastomosis, likely from postsurgical edema. Remaining resistive indices in the renal fossa are within normal limits.  -1.8 cm hypoechoic fluid collection abutting the left lower pole of the transplant kidney likely representing postsurgical seroma. Additional follow-up.    Please see below for data measurements:    Renal Transplant: length 10.3 cm; width 6.6 cm; height 4.8 cm    Arcuate artery superior resistive index: 0.66  Arcuate artery mid resistive index: 0.71  Arcuate artery inferior resistive index: 0.78    Segmental artery superior resistive index: 0.74  Segmental artery mid resistive index: 0.78  Segmental artery inferior resistive index: 0.77    Main renal artery hilum resistive index: 0.79  Main renal artery mid resistive index: 0.65  Main renal artery anastomosis resistive index: 0.88    Iliac artery: Patent  Iliac vein: Patent    6.6  4.8  10.3  0.66  0.71  0.78  0.74  0.78  0.77  0.79  0.65  0.88  XR Chest Portable  Narrative: EXAM: XR CHEST PORTABLE  DATE: 03/30/2019 1:52 PM  ACCESSION: [id number] UN DICTATED: 03/30/2019 1:53 PM  INTERPRETATION LOCATION: [facility]    CLINICAL INDICATION:  63 years old Female with S/P Abdominal Transplant:  Check Line and ETT Placement      COMPARISON: Chest radiograph 03/29/2019    TECHNIQUE: Portable Chest Radiograph.    FINDINGS:     Vascular stent projects over the left lung apex, unchanged.    Lungs radiographically clear.    No pleural effusion or pneumothorax.    Stable cardiomediastinal silhouette.   Impression: No evidence of endotracheal tube or central venous catheter as clinically questioned.    No acute airspace disease.  Echocardiogram W Colorflow Spectral Doppler  Patient Info  Name:     Michele Cisneros  Age:     63 years  DOB:     May 13, 1956  Gender:     Female  MRN:     [id number]  Accession #:     [id number]  Ht:     170 cm  Wt:     78 kg  BSA:     1.94 m2  Technical Quality:     Good  Exam Date:     03/29/2019 10:36 AM  Site Location:     [facility]  Exam Location:     [facility]  Admit Date:     03/28/2019    Exam Type:     ECHOCARDIOGRAM W COLORFLOW SPECTRAL DOPPLER-AR    Study Info  Indications      - - Kidney transplant admission    Three dimensional echocardiographic imaging is performed during the  transthoracic echocardiogram.    Complete two-dimensional, color flow and Doppler transthoracic  echocardiogram is performed.    Staff  Attending Physician:     Yaakov Guthrie MD  Referring Physician:     SELF, REFERRED  ;  Fellow:     Heriberto Antigua MD  Sonographer:     Edwena Bunde  Ordering Physician:     Marolyn Haller    Account #:     [id number]    Summary    1. The left ventricle is normal in size with mildly increased wall  thickness.    2. Normal left ventricular systolic function, ejection fraction 65-70%.    3. Dilated left atrium - mildly to moderately dilated.    4. Degenerative mitral valve disease - moderately thickened.    5. Mitral annular calcification.    6. Mitral regurgitation - moderate to severe.    7. Aortic sclerosis. 8. Mildly elevated right atrial pressure.    9. Normal right ventricular size and systolic function.    Left Ventricle    The left ventricle is normal in size with mildly increased wall thickness.    The left ventricular systolic function is normal, LVEF is visually estimated  at 65-70%.    Left ventricular diastolic function cannot be accurately assessed.    LV global longitudinal strain: -14.0 %.    Right Ventricle    The right ventricle is normal in size, with normal systolic function.    Right ventricle wall thickness is normal.    Left Atria    The left atrium is mildly to moderately dilated in size.    Right Atria    The right atrium is normal  in size.    Aortic Valve    The aortic valve is trileaflet with mildly thickened leaflets with probably  normal excursion.    There is trivial aortic regurgitation by color flow and continuous wave  Doppler imaging.    Increased peak velocity through the aortic valve may suggest mild stenosis  versus  hyperdynamic state. Lack of pulse wave dopper through the LVOT  precludes aortic stenosis calculation.    Pulmonic Valve    The pulmonic valve is normal.    There is mild pulmonic regurgitation by color flow and continuous wave  Doppler imaging.    There is no evidence of a significant transvalvular gradient.    Mitral Valve    The mitral valve leaflets are moderately thickened with normal leaflet  mobility.    Mitral annular calcification is present (moderate).    There is moderate to severe mitral valve regurgitation by color flow and  continuous wave Doppler imaging.    Tricuspid Valve    The tricuspid valve leaflets are normal, with normal leaflet mobility.    There is trivial tricuspid regurgitation by color flow and continuous wave  Doppler imaging.    No pulmonary hypertension, estimated pulmonary artery systolic pressure is  35 mmHg.    Other Findings    Rhythm: Sinus Rhythm.    Pericardium/Pleural    There is no  pericardial effusion.    Inferior Vena Cava The IVC diameter is <=21 mm with <50% decrease in size with inspiration  suggesting mildly elevated right atrial pressure (5-10 mm Hg).    Aorta    The aortic root at the sinus of Valsalva is normal.    Pulmonic Valve  ----------------------------------------------------------------------  Name                                 Value        Normal  ----------------------------------------------------------------------    PV Doppler  ----------------------------------------------------------------------  PV Peak Velocity                     1 m/s    Mitral Valve  ----------------------------------------------------------------------  Name                                 Value        Normal  ----------------------------------------------------------------------    MV Regurgitation Doppler  ----------------------------------------------------------------------  MR ERO (PISA)                     0.56 cm2                 MR Volume (PISA)                 129.94 ml                   MV Diastolic Function  ----------------------------------------------------------------------  MV E Peak Velocity                159 cm/s                 MV A Peak Velocity                142 cm/s                 MV E/A                                 1.1                   MV Annular TDI  ----------------------------------------------------------------------  MV Septal e' Velocity  5.9 cm/s         >=8.0   MV E/e' (Septal)                      27.1         <=8.0   MV Lateral e' Velocity            8.9 cm/s        >=10.0   MV E/e' (Lateral)                    17.83        <=8.00   MV e' Average                         7.40                 MV E/e' (Average)                    22.46    Tricuspid Valve  ----------------------------------------------------------------------  Name                                 Value        Normal  ----------------------------------------------------------------------    TV Regurgitation Doppler ----------------------------------------------------------------------  TR Peak Velocity                     3 m/s                 TR Peak Gradient                   27 mmHg                   Estimated PAP/RSVP  ----------------------------------------------------------------------  RA Pressure                         8 mmHg           <=5   PA Systolic Pressure               35 mmHg           <36   RV Systolic Pressure               35 mmHg           <36     TV Annular TDI  ----------------------------------------------------------------------  TV Lateral Ann s' Velocity       10.6 cm/s      9.5-18.7    Aorta  ----------------------------------------------------------------------  Name                                 Value        Normal  ----------------------------------------------------------------------    Ascending Aorta  ----------------------------------------------------------------------  Ao Root Diameter (2D)               2.9 cm                 Ao Root Diam Index (2D)          1.5 cm/m2    Aortic Valve  ----------------------------------------------------------------------  Name  Value        Normal  ----------------------------------------------------------------------    AV Doppler  ----------------------------------------------------------------------  AV Peak Velocity                     2 m/s    Ventricles  ----------------------------------------------------------------------  Name                                 Value        Normal  ----------------------------------------------------------------------    LV Dimensions 2D/MM  ----------------------------------------------------------------------  IVS Diastolic Thickness (2D)        1.2 cm       0.6-0.9   LVID Diastole (2D)                  4.5 cm       3.8-5.2   LVIW Diastolic Thickness  (2D)                                1.2 cm       0.6-0.9   LVID Systole (2D)                   2.5 cm       2.2-3.5 LV Mass (2D Cubed)                198.10 g  67.00-162.00   LV Mass Index (2D Cubed)        0.01 g/cm2     0.00-0.01   Relative Wall Thickness (2D)          0.53                   LV Function  ----------------------------------------------------------------------  LV Fractional Shortening  (2D)                                  44 %         27-45   LV EF (2D Teicholz)                   76 %         54-74   Avg Ap2 Ap3 Ap4 CMQ                -14.0 %                   RV Dimensions 2D/MM  ----------------------------------------------------------------------  RV Basal Diastolic Dimension       4.00 cm     2.50-4.10    Atria  ----------------------------------------------------------------------  Name                                 Value        Normal  ----------------------------------------------------------------------    LA Dimensions  ----------------------------------------------------------------------  LA Dimension (2D)                   4.6 cm       2.7-3.8   LA Dimen Index (2D)              2.4 cm/m2                   RA Dimensions  ----------------------------------------------------------------------  RA  Diastolic Major Axis  Length (4C)                         4.6 cm                 RA Area (4C)                      12.7 cm2        <=18.0    QLAB  ----------------------------------------------------------------------  Name                                 Value        Normal  ----------------------------------------------------------------------    CMQ (aCMQ)  ----------------------------------------------------------------------  AP2 CMQ                              -13 %                 AP3 CMQ                              -15 %                 AP4 CMQ                              -14 %                 AP2 EDV CMQ                      156.64 ml                 AP2 ESV CMQ                       68.34 ml                 AP2 EF CMQ                            56 % AP4 EDV CMQ                      122.65 ml                 AP4 ESV CMQ                       37.18 ml                 AP4 EF CMQ                            70 %                 Avg EF CMQ                            64 %                 Avg EDV CMQ  138.38 ml                 Avg ESV CMQ                       49.48 ml                   Heart Model  ----------------------------------------------------------------------  EDV HM                              180 ml                 ESV HM                               83 ml                 LV Length ED HM                     9.4 cm                 LV Length ES HM                     7.6 cm                 LV EF HM                              54 %                 LV SV HM                           98.0 ml                 LV Mass HM                           181 g                 LV CI HM                       3.61 l/min/m2    Report Signatures  Finalized by Yaakov Guthrie on 03/30/2019 01:39 PM  Resident Heriberto Antigua on 03/29/2019 12:06 PM        Philemon Riedesel AF Ritika Hellickson, MD  Surgery Resident, PGY-1  03/31/19 08:04

## 2019-04-03 NOTE — Unmapped (Signed)
TRANSPLANT SURGERY PROGRESS NOTE    Admit Date: 03/28/2019, Hospital Day: 7  Hospital Service: Surg Transplant [facility])  Attending: Loney Hering, MD    Assessment Michele Cisneros is a 63 yo F w/ ESRD 2/2 T2DM admitted for DDKT on 10/21.      63 y.o. female, hx   Patient Active Problem List    Diagnosis Date Noted   ??? Status post deceased-donor kidney transplantation 03/30/2019     Priority: High   ??? End stage renal disease (CMS-HCC) 11/08/2012   ??? Type II or unspecified type diabetes mellitus 11/08/2012   ??? Essential hypertension 11/08/2012   ??? Other and unspecified hyperlipidemia 11/08/2012    4 Days Post-Op s/p DDKT.    Plan   - Pain control: oxy 5-10 PRN  - Diet: renal  - HTN: amlodipine 10 mg, coreg 25 mg BID  - Cr down to 6.76 rom 7.50  - Drain care. Monitor output.  - anemia: hemoglobin 7.2 this am. Transfuse 1uPRBCs with 40 Lasix IV  - Encourage IS and mobility  - ML  - scheduled colace, miralax  -SQH starting tonight    Disposition: Floor status.    Please page SRF [phone] with questions or concerns.      Subjective   Some SOB overnight with desaturation to 88%, improved with 2L O2 and 80 Lasix. CXR this am unchanged.  Denies nausea, vomiting. Has had some flatus, tolerating renal diet. Making some urine.     Objective     Vitals:   Temp:  [36.6 ??C-36.9 ??C] 36.7 ??C  Heart Rate:  [70-86] 72  SpO2 Pulse:  [71] 71  Resp:  [14-24] 24  BP: (152-214)/(44-70) 189/69  MAP (mmHg):  [80-109] 105  A BP-2: (176)/(55) 176/55  MAP:  [97 mmHg] 97 mmHg  SpO2:  [88 %-99 %] 99 %    Intake/Output last 24 hours:  I/O       10/23 0701 - 10/24 0700 10/24 0701 - 10/25 0700 10/25 0701 - 10/26 0700    P.O. 620 420 120    I.V. (mL/kg) 50 (0.5)      Blood       IV Piggyback  0     Total Intake 670 420 120    Urine (mL/kg/hr) 1800 (0.8) 1130 (0.5) 475 (1.5)    Emesis/NG output       Drains 78 12 6    Stool 0 0     Total Output(mL/kg) 1878 (20.6) 1142 (13.3) 481 (5.6)    Net -1208 -722 -361           Stool Occurrence 0 x 0 x Physical Exam:    -General:  Appropriate, comfortable and in no apparent distress.   -Neurological: Alert and oriented x3. Moves all 4 extremities spontaneously.   -Cardiovascular: Regular rate and rhythm.  -Pulmonary: Normal work of breathing. No accessory muscle use.  -Abdomen: Soft, appropriately tender, non-distended. No rebound or guarding. Drains in place with SS fluid output  -Genitourinary: Foley in place.   -Extremities: Warm, well perfused, normal skin turgor.    -----------------------------------------------------    Data Review:  Lab Results   Component Value Date    WBC 12.2 (H) 04/03/2019    HGB 7.2 (L) 04/03/2019    HCT 21.2 (L) 04/03/2019    PLT 97 (L) 04/03/2019       Lab Results   Component Value Date    NA 130 (L) 04/03/2019    K 4.4 04/03/2019  CL 96 (L) 04/03/2019    CO2 21.0 (L) 04/03/2019    BUN 63 (H) 04/03/2019    CREATININE 6.76 (H) 04/03/2019    GLU 83 04/03/2019    CALCIUM 8.3 (L) 04/03/2019    MG 2.1 04/03/2019    PHOS 6.1 (H) 04/03/2019       Lab Results   Component Value Date    BILITOT 0.7 03/28/2019    BILIDIR 0.50 (H) 07/07/2017    PROT 6.7 03/28/2019    ALBUMIN 4.1 03/28/2019    ALT 10 03/28/2019    AST 20 03/28/2019    ALKPHOS 96 03/28/2019    GGT 11 03/28/2019       Lab Results   Component Value Date    LABPROT 12.1 04/20/2014    INR 0.93 03/28/2019    APTT >400.0 (HH) 03/28/2019         Imaging:  ECG 12 Lead  NORMAL SINUS RHYTHM  MINIMAL VOLTAGE CRITERIA FOR LVH, MAY BE NORMAL VARIANT  WHEN COMPARED WITH ECG OF 30-Mar-2019 13:55,  NONSPECIFIC AXIS SHIFT TO RIGHT  OTHERWISE NO SIGNIFICANT CHANGE WAS FOUND  Confirmed by Schuyler Amor ([id number]) on 04/03/2019 10:16:06 AM  XR Chest Portable  EXAM: XR CHEST PORTABLE  DATE: 04/03/2019 5:08 AM  ACCESSION: [id number] UN  DICTATED: 04/03/2019 7:44 AM  INTERPRETATION LOCATION: [facility]    CLINICAL INDICATION: 63 years old Female with SHORTNESS OF BREATH      COMPARISON: Previous day    TECHNIQUE: Portable upright    FINDINGS: The bilateral alveolar opacities due to edema and/or infectious infiltrate appear minimally increased. No pneumothorax or pleural fluid is seen. The rest of the study is stable.        Otilio Connors, MD  Surgery Resident, PGY-1  04/03/19 08:04

## 2019-04-04 DIAGNOSIS — Z940 Kidney transplant status: Principal | ICD-10-CM

## 2019-04-04 DIAGNOSIS — Z79899 Other long term (current) drug therapy: Principal | ICD-10-CM

## 2019-04-04 NOTE — Unmapped (Signed)
TRANSPLANT SURGERY PROGRESS NOTE    Admit Date: 03/28/2019, Hospital Day: 8  Hospital Service: Surg Transplant [facility])  Attending: Loney Hering, MD    Assessment Michele Cisneros is a 63 yo F w/ ESRD 2/2 T2DM admitted for DDKT on 10/21. C/b delayed graft function, now with nausea and vomiting concerning for ileus.     63 y.o. female, hx   Patient Active Problem List    Diagnosis Date Noted   ??? Status post deceased-donor kidney transplantation 03/30/2019     Priority: High   ??? End stage renal disease (CMS-HCC) 11/08/2012   ??? Type II or unspecified type diabetes mellitus 11/08/2012   ??? Essential hypertension 11/08/2012   ??? Other and unspecified hyperlipidemia 11/08/2012    5 Days Post-Op s/p DDKT.    Plan   - Pain control: oxy 5-10 PRN  - Diet: renal  - HTN: amlodipine 10 mg, coreg 25 mg BID, clonidine,   - Cr down to 6.33 from 6.76  - Drain care. Monitor output  - TOV; drain out once passes TOV  - Fluid restriction to 1L daily d/t hyponatremia  - KUB, CXR  - consider NG placement if still nauseous  - anemia: hemoglobin 8.7 this am after 1 unit PRBCs yesterday  - Encourage IS and mobility  - ML  - scheduled colace, miralax, dulcolax supp, protonix  -SQH     Disposition: Floor status.    Please page SRF [phone] with questions or concerns.      Subjective   Nausea, vomiting this morning. Has had some flatus, but no BM, tolerating renal diet. Making increasing amounts of urine.     Objective     Vitals:   Temp:  [36.7 ??C-36.9 ??C] 36.8 ??C  Heart Rate:  [70-76] 74  Resp:  [16-18] 18  BP: (151-179)/(48-60) 174/48  MAP (mmHg):  [83-93] 86  SpO2:  [93 %-100 %] 100 %    Intake/Output last 24 hours:  I/O       10/24 0701 - 10/25 0700 10/25 0701 - 10/26 0700 10/26 0701 - 10/27 0700    P.O. 420 720 0    I.V. (mL/kg)       Blood  315     IV Piggyback 0 69     Total Intake 420 1104 0    Urine (mL/kg/hr) 1130 (0.5) 1800 (0.9) 515 (0.7)    Emesis/NG output   200    Drains 12 13 10     Stool 0  0 Total Output(mL/kg) 1142 (13.3) 1813 (21.3) 725 (8.5)    Net -722 -709 -725           Urine Occurrence   1 x    Stool Occurrence 0 x  0 x          Physical Exam:    -General:  Appropriate, comfortable and in no apparent distress.   -Neurological: Alert and oriented x3. Moves all 4 extremities spontaneously.   -Cardiovascular: Regular rate and rhythm.  -Pulmonary: Normal work of breathing. No accessory muscle use.  -Abdomen: Soft, appropriately tender, non-distended. No rebound or guarding. Drains in place with SS fluid output  -Genitourinary: Foley in place.   -Extremities: Warm, well perfused, normal skin turgor.    -----------------------------------------------------    Data Review:  Lab Results   Component Value Date    WBC 7.6 04/04/2019    HGB 8.7 (L) 04/04/2019    HCT 25.7 (L) 04/04/2019    PLT 111 (L) 04/04/2019  Lab Results   Component Value Date    NA 127 (L) 04/04/2019    K 3.7 04/04/2019    CL 97 (L) 04/04/2019    CO2 24.0 04/04/2019    BUN 62 (H) 04/04/2019    CREATININE 6.33 (H) 04/04/2019    GLU 75 04/04/2019    CALCIUM 8.2 (L) 04/04/2019    MG 2.1 04/04/2019    PHOS 5.5 (H) 04/04/2019       Lab Results   Component Value Date    BILITOT 0.7 03/28/2019    BILIDIR 0.50 (H) 07/07/2017    PROT 6.7 03/28/2019    ALBUMIN 4.1 03/28/2019    ALT 10 03/28/2019    AST 20 03/28/2019    ALKPHOS 96 03/28/2019    GGT 11 03/28/2019       Lab Results   Component Value Date    LABPROT 12.1 04/20/2014    INR 0.93 03/28/2019    APTT >400.0 (HH) 03/28/2019         Imaging:  XR Abdomen 1 View  Narrative: EXAM: XR ABDOMEN 1 VIEW  DATE: 04/04/2019 12:08 PM  ACCESSION: [id number] UN  DICTATED: 04/04/2019 1:12 PM  INTERPRETATION LOCATION: [facility]    CLINICAL INDICATION: 64 years old Female with PARALYTIC ILEUS      COMPARISON: CT abdomen and pelvis 03/29/2019.    TECHNIQUE: Supine view of the abdomen.    FINDINGS: Left ureteral stent visualized in the pelvis with tips overlying the expected region of the bladder and LLQ transplant kidney. Staples overlying the left lower quadrant. Surgical clips overlie the left pelvis.    Nonspecific bowel gas pattern throughout nondilated loops of small and large bowel.    Atherosclerotic calcification seen in the left upper quadrant and pelvis. Multiple pelvic phleboliths. No acute bony abnormalities.  Impression: No gaseous distention of bowel.    Left ureteral stent visualized in the pelvis with tips overlying the expected region of the bladder and LLQ transplant kidney.         Otilio Connors, MD  Surgery Resident, PGY-1  04/04/19 08:04

## 2019-04-05 DIAGNOSIS — Z940 Kidney transplant status: Principal | ICD-10-CM

## 2019-04-05 DIAGNOSIS — Z79899 Other long term (current) drug therapy: Principal | ICD-10-CM

## 2019-04-05 NOTE — Unmapped (Signed)
TRANSPLANT SURGERY PROGRESS NOTE    Admit Date: 03/28/2019, Hospital Day: 9  Hospital Service: Surg Transplant [facility])  Attending: Loney Hering, MD    Assessment Michele Cisneros is a 63 yo F w/ ESRD 2/2 T2DM admitted for DDKT on 10/21. C/b delayed graft function, now with nausea and vomiting concerning for ileus.     63 y.o. female, hx   Patient Active Problem List    Diagnosis Date Noted   ??? Status post deceased-donor kidney transplantation 03/30/2019     Priority: High   ??? End stage renal disease (CMS-HCC) 11/08/2012   ??? Type II or unspecified type diabetes mellitus 11/08/2012   ??? Essential hypertension 11/08/2012   ??? Other and unspecified hyperlipidemia 11/08/2012    6 Days Post-Op s/p DDKT.    Plan   - Pain control: oxy 5-10 PRN  - Diet: renal  - HTN: amlodipine 10 mg, coreg 25 mg BID, clonidine 0.2 mg TID  - Drains out today  - DC Fluid restriction  - anemia: Stable  - Encourage IS and mobility  - ML  - scheduled colace, miralax, dulcolax supp, protonix  -SQH     Disposition: Floor status.    Please page SRF [phone] with questions or concerns.      Subjective   Nausea, vomiting resolved this morning. Reports BM x3 yesterday, tolerating full liquid diet. Making increasing amounts of urine.     Objective     Vitals:   Temp:  [36.7 ??C-36.9 ??C] 36.9 ??C  Heart Rate:  [71-74] 71  Resp:  [16-19] 16  BP: (170-189)/(48-62) 177/58  MAP (mmHg):  [86-95] 93  SpO2:  [96 %-100 %] 99 %    Intake/Output last 24 hours:  I/O       10/25 0701 - 10/26 0700 10/26 0701 - 10/27 0700 10/27 0701 - 10/28 0700    P.O. 720 290     Blood 315      IV Piggyback 69      Total Intake 1104 290     Urine (mL/kg/hr) 1800 (0.9) 1840 (0.9)     Emesis/NG output  200     Drains 13 17     Stool  0     Total Output(mL/kg) 1813 (21.3) 2057 (24)     Net -709 -1767            Urine Occurrence  2 x     Stool Occurrence  2 x           Physical Exam:    -General:  Appropriate, comfortable and in no apparent distress. -Neurological: Alert and oriented x3. Moves all 4 extremities spontaneously.   -Cardiovascular: Regular rate and rhythm.  -Pulmonary: Normal work of breathing. No accessory muscle use.  -Abdomen: Soft, appropriately tender, non-distended. No rebound or guarding. Drains in place with SS fluid output  -Genitourinary: Voiding spontaneously.   -Extremities: Warm, well perfused, normal skin turgor.    -----------------------------------------------------    Data Review:  Lab Results   Component Value Date    WBC 6.1 04/05/2019    HGB 8.5 (L) 04/05/2019    HCT 25.0 (L) 04/05/2019    PLT 118 (L) 04/05/2019       Lab Results   Component Value Date    NA 134 (L) 04/05/2019    K 3.5 04/05/2019    CL 100 04/05/2019    CO2 28.0 04/05/2019    BUN 57 (H) 04/05/2019    CREATININE 5.10 (H) 04/05/2019  GLU 84 04/05/2019    CALCIUM 8.5 04/05/2019    MG 2.0 04/05/2019    PHOS 4.7 04/05/2019       Lab Results   Component Value Date    BILITOT 0.7 03/28/2019    BILIDIR 0.50 (H) 07/07/2017    PROT 6.7 03/28/2019    ALBUMIN 4.1 03/28/2019    ALT 10 03/28/2019    AST 20 03/28/2019    ALKPHOS 96 03/28/2019    GGT 11 03/28/2019       Lab Results   Component Value Date    LABPROT 12.1 04/20/2014    INR 0.93 03/28/2019    APTT >400.0 (HH) 03/28/2019         Imaging:  XR Chest Portable  Narrative: EXAM: XR CHEST PORTABLE  DATE: 04/04/2019 3:11 PM  ACCESSION: [id number] UN  DICTATED: 04/04/2019 5:00 PM  INTERPRETATION LOCATION: [facility]    CLINICAL INDICATION: 63 years old Female with POSTSURGICAL STATUS      COMPARISON: 04/03/2019.    TECHNIQUE: Portable Chest Radiograph.    FINDINGS:     Vascular stent overlying the left lung apex.    Interval improved aeration of bilateral lungs. No focal consolidation. Persistent mild prominent of the pulmonary interstitium.    No pleural effusion or pneumothorax    Stable cardiomediastinal silhouette. Impression: Interval improved aeration in the bilateral lungs with persistent mild interstitial edema.  XR Abdomen 1 View  Narrative: EXAM: XR ABDOMEN 1 VIEW  DATE: 04/04/2019 12:08 PM  ACCESSION: [id number] UN  DICTATED: 04/04/2019 1:12 PM  INTERPRETATION LOCATION: [facility]    CLINICAL INDICATION: 63 years old Female with PARALYTIC ILEUS      COMPARISON: CT abdomen and pelvis 03/29/2019.    TECHNIQUE: Supine view of the abdomen.    FINDINGS:   Left ureteral stent visualized in the pelvis with tips overlying the expected region of the bladder and LLQ transplant kidney. Staples overlying the left lower quadrant. Surgical clips overlie the left pelvis.    Nonspecific bowel gas pattern throughout nondilated loops of small and large bowel.    Atherosclerotic calcification seen in the left upper quadrant and pelvis. Multiple pelvic phleboliths. No acute bony abnormalities.  Impression: No gaseous distention of bowel.    Left ureteral stent visualized in the pelvis with tips overlying the expected region of the bladder and LLQ transplant kidney.         Leona Singleton, MD  Surgery Resident, PGY-3  04/05/19 08:04

## 2019-04-06 MED ORDER — CARVEDILOL 12.5 MG TABLET
ORAL_TABLET | Freq: Two times a day (BID) | ORAL | 11 refills | 30.00000 days | Status: CP
Start: 2019-04-06 — End: 2020-04-05
  Filled 2019-04-06: qty 120, 30d supply, fill #0

## 2019-04-06 MED ORDER — CLONIDINE HCL 0.1 MG TABLET
ORAL_TABLET | Freq: Three times a day (TID) | ORAL | 11 refills | 30.00000 days | Status: CP
Start: 2019-04-06 — End: 2020-04-05
  Filled 2019-04-06: qty 180, 30d supply, fill #0

## 2019-04-06 MED ORDER — SENNOSIDES 8.6 MG TABLET
ORAL_TABLET | Freq: Every evening | ORAL | 11 refills | 30.00000 days | Status: CP | PRN
Start: 2019-04-06 — End: 2020-04-05
  Filled 2019-04-06: qty 30, 30d supply, fill #0

## 2019-04-06 MED ORDER — TACROLIMUS ER 1 MG TABLET,EXTENDED RELEASE 24 HR
ORAL_TABLET | Freq: Every day | ORAL | 11 refills | 30.00000 days | Status: CP
Start: 2019-04-06 — End: 2020-04-05

## 2019-04-06 MED ORDER — TACROLIMUS ER 4 MG TABLET,EXTENDED RELEASE 24 HR
ORAL_TABLET | Freq: Every day | ORAL | 11 refills | 30 days | Status: CP
Start: 2019-04-06 — End: 2020-04-05
  Filled 2019-04-06: qty 90, 30d supply, fill #0

## 2019-04-06 MED ORDER — Medication
Status: DC
Start: ? — End: 2019-04-06

## 2019-04-06 MED ORDER — PSEUDOEPHEDRINE-GG & DM 60-375 & 20 MG &MG/5ML PO KIT
750.00 | PACK | ORAL | Status: DC
Start: 2019-04-06 — End: 2019-04-06

## 2019-04-06 MED ORDER — CHLOROPHYLL EX
5.00 | CUTANEOUS | Status: DC
Start: ? — End: 2019-04-06

## 2019-04-06 MED ORDER — SULFAMETHOXAZOLE-TRIMETHOPRIM 400-80 MG PO TABS
1.00 | ORAL_TABLET | ORAL | Status: DC
Start: 2019-04-08 — End: 2019-04-06

## 2019-04-06 MED ORDER — PRONUTRA PO POWD
450.00 | ORAL | Status: DC
Start: 2019-04-09 — End: 2019-04-06

## 2019-04-06 MED ORDER — PREGABALIN 25 MG PO CAPS
75.00 | ORAL_CAPSULE | ORAL | Status: DC
Start: 2019-04-07 — End: 2019-04-06

## 2019-04-06 MED ORDER — Medication
12.50 | Status: DC
Start: ? — End: 2019-04-06

## 2019-04-06 MED ORDER — Medication
10.00 | Status: DC
Start: 2019-04-06 — End: 2019-04-06

## 2019-04-06 MED ORDER — EQUATE NICOTINE 4 MG MT GUM
4.00 | CHEWING_GUM | OROMUCOSAL | Status: DC
Start: ? — End: 2019-04-06

## 2019-04-06 MED ORDER — AEROCHAMBER PLUS MISC
0.50 | Status: DC
Start: ? — End: 2019-04-06

## 2019-04-06 MED ORDER — BENICAR 20 MG PO TABS
12.50 | ORAL_TABLET | ORAL | Status: DC
Start: ? — End: 2019-04-06

## 2019-04-06 MED ORDER — VICON FORTE PO CAPS
17.00 | ORAL_CAPSULE | ORAL | Status: DC
Start: 2019-04-06 — End: 2019-04-06

## 2019-04-06 MED ORDER — PANTOPRAZOLE SODIUM 40 MG PO TBEC
40.00 | DELAYED_RELEASE_TABLET | ORAL | Status: DC
Start: 2019-04-07 — End: 2019-04-06

## 2019-04-06 MED ORDER — NEXTERONE IV
81.00 | INTRAVENOUS | Status: DC
Start: 2019-04-07 — End: 2019-04-06

## 2019-04-06 MED ORDER — QUINERVA 260 MG PO TABS
650.00 | ORAL_TABLET | ORAL | Status: DC
Start: ? — End: 2019-04-06

## 2019-04-06 MED ORDER — MEDI-TUSSIN DM DOUBLE STRENGTH 30-200 MG/5ML PO LIQD
1000.00 | ORAL | Status: DC
Start: 2019-04-06 — End: 2019-04-06

## 2019-04-06 MED ORDER — CLONIDINE HCL 0.1 MG PO TABS
0.20 | ORAL_TABLET | ORAL | Status: DC
Start: 2019-04-06 — End: 2019-04-06

## 2019-04-06 MED ORDER — COMPOUND W FREEZE OFF EX AERO
0.00 | INHALATION_SPRAY | CUTANEOUS | Status: DC
Start: 2019-04-06 — End: 2019-04-06

## 2019-04-06 MED ORDER — GENERIC EXTERNAL MEDICATION
Status: DC
Start: ? — End: 2019-04-06

## 2019-04-06 MED ORDER — PROMETHAZINE HCL (BULK CHEMICALS - P'S)
50.00 | Status: DC
Start: ? — End: 2019-04-06

## 2019-04-06 MED ORDER — GLUCOSAMINE-CHONDROIT-COLLAGEN PO
100.00 | ORAL | Status: DC
Start: 2019-04-06 — End: 2019-04-06

## 2019-04-06 MED ORDER — BISACODYL 10 MG RE SUPP
10.00 | RECTAL | Status: DC
Start: 2019-04-07 — End: 2019-04-06

## 2019-04-06 MED ORDER — Medication
9.00 | Status: DC
Start: 2019-04-07 — End: 2019-04-06

## 2019-04-06 MED ORDER — CELLULOSE SODIUM PHOSPHATE VI
5000.00 | Status: DC
Start: 2019-04-06 — End: 2019-04-06

## 2019-04-06 MED ORDER — MAVIK 1 MG PO TABS
1.00 | ORAL_TABLET | ORAL | Status: DC
Start: 2019-04-06 — End: 2019-04-06

## 2019-04-06 MED ORDER — Medication
10.00 | Status: DC
Start: 2019-04-07 — End: 2019-04-06

## 2019-04-06 MED ORDER — CATALYTIC FORMULA PO
25.00 | ORAL | Status: DC
Start: 2019-04-06 — End: 2019-04-06

## 2019-04-06 MED ORDER — PHENYLEPHRINE-GUAIFENESIN 20-375 MG PO CP12
10.00 | ORAL_CAPSULE | ORAL | Status: DC
Start: ? — End: 2019-04-06

## 2019-04-06 MED FILL — MYCOPHENOLATE SODIUM 180 MG TABLET,DELAYED RELEASE: 30 days supply | Qty: 180 | Fill #0 | Status: AC

## 2019-04-06 MED FILL — CARVEDILOL 12.5 MG TABLET: 30 days supply | Qty: 120 | Fill #0 | Status: AC

## 2019-04-06 MED FILL — SULFAMETHOXAZOLE 400 MG-TRIMETHOPRIM 80 MG TABLET: ORAL | 28 days supply | Qty: 12 | Fill #0

## 2019-04-06 MED FILL — AMLODIPINE 10 MG TABLET: 30 days supply | Qty: 30 | Fill #0 | Status: AC

## 2019-04-06 MED FILL — SULFAMETHOXAZOLE 400 MG-TRIMETHOPRIM 80 MG TABLET: 28 days supply | Qty: 12 | Fill #0 | Status: AC

## 2019-04-06 MED FILL — DOK 100 MG CAPSULE: ORAL | 30 days supply | Qty: 60 | Fill #0

## 2019-04-06 MED FILL — ACETAMINOPHEN 500 MG TABLET: ORAL | 4 days supply | Qty: 30 | Fill #0

## 2019-04-06 MED FILL — ENVARSUS XR 1 MG TABLET,EXTENDED RELEASE: ORAL | 30 days supply | Qty: 90 | Fill #0

## 2019-04-06 MED FILL — MYCOPHENOLATE SODIUM 180 MG TABLET,DELAYED RELEASE: ORAL | 30 days supply | Qty: 180 | Fill #0

## 2019-04-06 MED FILL — DOK 100 MG CAPSULE: 30 days supply | Qty: 60 | Fill #0 | Status: AC

## 2019-04-06 MED FILL — VALGANCICLOVIR 450 MG TABLET: 30 days supply | Qty: 30 | Fill #0 | Status: AC

## 2019-04-06 MED FILL — ENVARSUS XR 4 MG TABLET,EXTENDED RELEASE: 30 days supply | Qty: 90 | Fill #0 | Status: AC

## 2019-04-06 MED FILL — PANTOPRAZOLE 40 MG TABLET,DELAYED RELEASE: 30 days supply | Qty: 30 | Fill #0 | Status: AC

## 2019-04-06 MED FILL — POLYETHYLENE GLYCOL 3350 17 GRAM/DOSE ORAL POWDER: 30 days supply | Qty: 510 | Fill #0 | Status: AC

## 2019-04-06 MED FILL — OXYCODONE 5 MG TABLET: 7 days supply | Qty: 40 | Fill #0 | Status: AC

## 2019-04-06 MED FILL — CLONIDINE HCL 0.1 MG TABLET: 30 days supply | Qty: 180 | Fill #0 | Status: AC

## 2019-04-06 MED FILL — OXYCODONE 5 MG TABLET: ORAL | 7 days supply | Qty: 40 | Fill #0

## 2019-04-06 MED FILL — VALGANCICLOVIR 450 MG TABLET: ORAL | 30 days supply | Qty: 30 | Fill #0

## 2019-04-06 MED FILL — ACETAMINOPHEN 500 MG TABLET: 4 days supply | Qty: 30 | Fill #0 | Status: AC

## 2019-04-06 MED FILL — AMLODIPINE 10 MG TABLET: ORAL | 30 days supply | Qty: 30 | Fill #0

## 2019-04-06 MED FILL — MG-PLUS-PROTEIN 133 MG TABLET: 50 days supply | Qty: 100 | Fill #0 | Status: AC

## 2019-04-06 MED FILL — ENVARSUS XR 1 MG TABLET,EXTENDED RELEASE: 30 days supply | Qty: 90 | Fill #0 | Status: AC

## 2019-04-06 MED FILL — MG-PLUS-PROTEIN 133 MG TABLET: ORAL | 50 days supply | Qty: 100 | Fill #0

## 2019-04-06 MED FILL — POLYETHYLENE GLYCOL 3350 17 GRAM/DOSE ORAL POWDER: ORAL | 30 days supply | Qty: 510 | Fill #0

## 2019-04-06 MED FILL — SENNA LAX 8.6 MG TABLET: 30 days supply | Qty: 30 | Fill #0 | Status: AC

## 2019-04-06 NOTE — Unmapped (Signed)
TRANSPLANT SURGERY PROGRESS NOTE    Admit Date: 03/28/2019, Hospital Day: 10  Hospital Service: Surg Transplant [facility])  Attending: Loney Hering, MD    Assessment Michele Cisneros is a 63 yo F w/ ESRD 2/2 T2DM admitted for DDKT on 10/21. C/b delayed graft function, now with nausea and vomiting concerning for ileus.     63 y.o. female, hx   Patient Active Problem List    Diagnosis Date Noted   ??? Status post deceased-donor kidney transplantation 03/30/2019     Priority: High   ??? End stage renal disease (CMS-HCC) 11/08/2012   ??? Type II or unspecified type diabetes mellitus 11/08/2012   ??? Essential hypertension 11/08/2012   ??? Other and unspecified hyperlipidemia 11/08/2012    7 Days Post-Op s/p DDKT.    Plan   - Pain control: oxy 5-10 PRN  - Diet: renal  - HTN: amlodipine 10 mg, coreg 25 mg BID, clonidine 0.2 mg TID  - anemia: Stable  - Encourage IS and mobility  - ML  - Appreciate Endocrinology input  - Appreciate Nephrology input  - scheduled colace, miralax, dulcolax supp, protonix  - SQH     Disposition: Floor status. Home likely today vs tomorrow     Please page SRF [phone] with questions or concerns.      Subjective   NAEON. Tolerating diet. Pain controlled. Making increasing amounts of urine.     Objective     Vitals:   Temp:  [36.5 ??C-37.9 ??C] 37.9 ??C  Heart Rate:  [62-77] 71  Resp:  [16-19] 16  BP: (162-179)/(46-90) 177/90  MAP (mmHg):  [88-96] 90  SpO2:  [98 %-100 %] 98 %    Intake/Output last 24 hours:  I/O       10/26 0701 - 10/27 0700 10/27 0701 - 10/28 0700 10/28 0701 - 10/29 0700    P.O. 290 920     Blood       IV Piggyback       Total Intake 290 920     Urine (mL/kg/hr) 1840 (0.9) 1810 (0.9)     Emesis/NG output 200      Drains 17 23     Stool 0 0     Total Output(mL/kg) 2057 (24) 1833 (22)     Net -1767 -913            Urine Occurrence 2 x 1 x     Stool Occurrence 2 x 2 x           Physical Exam:    -General:  Appropriate, comfortable and in no apparent distress. -Neurological: Alert and oriented x3. Moves all 4 extremities spontaneously.   -Cardiovascular: Regular rate and rhythm.  -Pulmonary: Normal work of breathing. No accessory muscle use.  -Abdomen: Soft, appropriately tender, non-distended. No rebound or guarding. Drains in place with SS fluid output  -Genitourinary: Voiding spontaneously.   -Extremities: Warm, well perfused, normal skin turgor.    -----------------------------------------------------    Data Review:  Lab Results   Component Value Date    WBC 5.7 04/06/2019    HGB 8.7 (L) 04/06/2019    HCT 25.7 (L) 04/06/2019    PLT 135 (L) 04/06/2019       Lab Results   Component Value Date    NA 132 (L) 04/06/2019    K 3.7 04/06/2019    CL 101 04/06/2019    CO2 27.0 04/06/2019    BUN 45 (H) 04/06/2019    CREATININE 3.51 (H) 04/06/2019  GLU 93 04/06/2019    CALCIUM 8.9 04/06/2019    MG 1.9 04/06/2019    PHOS 3.6 04/06/2019       Lab Results   Component Value Date    BILITOT 0.7 03/28/2019    BILIDIR 0.50 (H) 07/07/2017    PROT 6.7 03/28/2019    ALBUMIN 4.1 03/28/2019    ALT 10 03/28/2019    AST 20 03/28/2019    ALKPHOS 96 03/28/2019    GGT 11 03/28/2019       Lab Results   Component Value Date    LABPROT 12.1 04/20/2014    INR 0.93 03/28/2019    APTT >400.0 (HH) 03/28/2019         Imaging:  XR Chest Portable  Narrative: EXAM: XR CHEST PORTABLE  DATE: 04/04/2019 3:11 PM  ACCESSION: [id number] UN  DICTATED: 04/04/2019 5:00 PM  INTERPRETATION LOCATION: [facility]    CLINICAL INDICATION: 63 years old Female with POSTSURGICAL STATUS      COMPARISON: 04/03/2019.    TECHNIQUE: Portable Chest Radiograph.    FINDINGS:     Vascular stent overlying the left lung apex.    Interval improved aeration of bilateral lungs. No focal consolidation. Persistent mild prominent of the pulmonary interstitium.    No pleural effusion or pneumothorax    Stable cardiomediastinal silhouette. Impression: Interval improved aeration in the bilateral lungs with persistent mild interstitial edema.  XR Abdomen 1 View  Narrative: EXAM: XR ABDOMEN 1 VIEW  DATE: 04/04/2019 12:08 PM  ACCESSION: [id number] UN  DICTATED: 04/04/2019 1:12 PM  INTERPRETATION LOCATION: [facility]    CLINICAL INDICATION: 63 years old Female with PARALYTIC ILEUS      COMPARISON: CT abdomen and pelvis 03/29/2019.    TECHNIQUE: Supine view of the abdomen.    FINDINGS:   Left ureteral stent visualized in the pelvis with tips overlying the expected region of the bladder and LLQ transplant kidney. Staples overlying the left lower quadrant. Surgical clips overlie the left pelvis.    Nonspecific bowel gas pattern throughout nondilated loops of small and large bowel.    Atherosclerotic calcification seen in the left upper quadrant and pelvis. Multiple pelvic phleboliths. No acute bony abnormalities.  Impression: No gaseous distention of bowel.    Left ureteral stent visualized in the pelvis with tips overlying the expected region of the bladder and LLQ transplant kidney.         Leona Singleton, MD  Surgery Resident, PGY-3  04/06/19 08:04

## 2019-04-07 DIAGNOSIS — Z940 Kidney transplant status: Principal | ICD-10-CM

## 2019-04-07 DIAGNOSIS — Z79899 Other long term (current) drug therapy: Principal | ICD-10-CM

## 2019-04-07 MED ORDER — PANTOPRAZOLE 40 MG TABLET,DELAYED RELEASE
ORAL_TABLET | Freq: Every day | ORAL | 11 refills | 30 days | Status: CP
Start: 2019-04-07 — End: 2020-04-06
  Filled 2019-04-06: qty 30, 30d supply, fill #0

## 2019-04-07 NOTE — Unmapped (Signed)
Called 3 times to check in with patient. No answer or VM with first calls but was able to leave VM fo

## 2019-04-07 NOTE — Unmapped (Signed)
Call to patient today to check on status since discharge. The following questions were asked:     How are you feeling right now? Is your pain controlled? no, she's at a 7 out of 10. I have arthritis in her back and she was supposed to have her back fixed. She used CBD oil on her back and it helped. She wants to know if she can use it again.    Are you taking your pain medication? Yes it works for 10-15 minutes and then it doesn't work no more    Speaking of medicine, do you have all of your medications? yes Are there medications on your medication list that are missing? no    Do you have a BP cuff, scale and thermometer? yes    How is your BP? (Unless instructed otherwise, patient is supposed to call if > 160/100 or < 100/60) it was high this morning and now it has come down    Do you have any new swelling? no  Is your swelling the same?...worse?Marland Kitchen...or better? Then when you left the hospital. No swelling    Are you eating and drinking Ok? Yes, appetite is reduced    Any nausea, vomiting or diarrhea? Yes nausea and diarrhea I told her to call tomorrow if diarrhea continues.    Are you passing your urine OK? yes    Did you go home with your bladder urinary (foley) catheter ? no  Or JP drain? no  How is the output from your urinary catheter or JP? n/a    Any issues with your wound?Marland Kitchen..drainage?...have you looked at your incision? clean, dry, intact    Any other questions or concerns? yes, should I take the lasix? No since it isn't on your med list from the pharmacist.  Loma Boston, RN Inpatient Transplant Nurse Coordinator 04/07/2019 6:43 PM

## 2019-04-07 NOTE — Unmapped (Signed)
Discharge Summary    Admit date: 03/28/2019    Discharge date and time: 04/06/19, afternoon    Discharge to:  Home      Discharge Service: Surg Transplant [facility])    Discharge Attending Physician: No att. providers found    Admitting Diagnosis: ESRD    Discharge  Diagnoses: ESRD    Secondary Diagnosis: Active Problems:    Status post deceased-donor kidney transplantation POA: Not Applicable    Immunocompromised patient (CMS-HCC) POA: No  Resolved Problems:    * No resolved hospital problems. *      OR Procedures:   Left - RENAL ALLOTRANSPLANTATION, IMPLANTATION OF GRAFT; WITHOUT RECIPIENT NEPHRECTOMY  Date  03/30/2019  -------------------     Ancillary Procedures: no procedures    Discharge Day Services:  The patient was seen and examined by the Transplant Surgery team on the day of discharge. Vital signs and laboratory values were stable and within normal limits. Surgical wounds were inspected and patient's physical exam was benign and unchanged. Discharge plan was discussed, instructions were given and all questions answered.    Objective:    No data found.    I/O this shift:  In: 1710 [P.O.:710; IV Piggyback:1000]  Out: 1488 [Urine:1480; Drains:8]    Abdomen: Soft, appropriately tender, non-distended. No rebound or guarding. Drains in place with SS fluid output    Hospital Course:  Patient with hx ESRD 2/2 T2DM??admitted for DDKT on 10/21. Her operation went well initially, but was complicated by delayed graft function, as well as post-op nausea and vomiting. Her renal function improved and UOP picked up. Her GI symptoms resolved. Her post-op renal ultrasound was sufficient in regards to renal perfusion. She was tolerating PO intake and pain was well controlled. She was discharged POD #7 with 2 drains in place and instructed to follow up closely with transplant clinic.     EXAM: US RENAL TRANSPLANT W DOPPLER   DATE: 03/30/2019 4:25 PM -Elevated resistive index in the main renal artery anastomosis, likely from postsurgical edema. Remaining resistive indices in the renal fossa are within normal limits.   -1.8 cm hypoechoic fluid collection abutting the left lower pole of the transplant kidney likely representing postsurgical seroma. Additional follow-up.       Please see below for data measurements:       Renal Transplant: length 10.3 cm; width 6.6 cm; height 4.8 cm     Arcuate artery superior resistive index: 0.66   Arcuate artery mid resistive index: 0.71   Arcuate artery inferior resistive index: 0.78     Segmental artery superior resistive index: 0.74   Segmental artery mid resistive index: 0.78   Segmental artery inferior resistive index: 0.77     Main renal artery hilum resistive index: 0.79   Main renal artery mid resistive index: 0.65   Main renal artery anastomosis resistive index: 0.88       Iliac artery: Patent   Iliac vein: Patent           6.6   4.8   10.3   0.66   0.71   0.78   0.74   0.78   0.77   0.79   0.65   0.88      Condition at Discharge: Improved  Discharge Medications:      Your Medication List      STOP taking these medications    calcium acetate(phosphat bind) 667 mg capsule  Commonly known as: PHOSLO     hydrALAZINE 25 MG tablet  Commonly known as: APRESOLINE     losartan 100 MG tablet  Commonly known as: COZAAR     RENA-VITE 0.8 mg Tab  Generic drug: B complex-vitamin C-folic acid     RENA-VITE ORAL     torsemide 10 MG tablet  Commonly known as: DEMADEX        START taking these medications    acetaminophen 500 MG tablet  Commonly known as: TYLENOL  Take 1-2 tablets (500-1,000 mg total) by mouth every six (6) hours as needed for pain or fever (> 38C or 100.29F).     DOK 100 MG capsule  Generic drug: docusate sodium  Take 1 capsule (100 mg total) by mouth two (2) times a day as needed for constipation.     magnesium (amino acid chelate) 133 mg Tab  Generic drug: magnesium oxide-Mg AA chelate Take 1 tablet by mouth Two (2) times a day. HOLD until directed to start by your coordinator.     mycophenolate 180 MG EC tablet  Commonly known as: MYFORTIC  Take 3 tablets (540 mg total) by mouth Two (2) times a day.     oxyCODONE 5 MG immediate release tablet  Commonly known as: ROXICODONE  Take 1-2 tablets (5-10 mg total) by mouth every six (6) hours as needed for pain.     pantoprazole 40 MG tablet  Commonly known as: PROTONIX  Take 1 tablet (40 mg total) by mouth daily.     polyethylene glycol 17 gram/dose powder  Commonly known as: GLYCOLAX  Mix 1 capful (17 g) in 4 to 8 ounces water, juice, soda, coffee, or tea and drink daily as needed.     SENNA LAX 8.6 mg tablet  Generic drug: senna  Take 1 tablet by mouth nightly as needed for constipation.     sulfamethoxazole-trimethoprim 400-80 mg per tablet  Commonly known as: BACTRIM  Take 1 tablet (80 mg of trimethoprim total) by mouth Every Monday, Wednesday, and Friday.     tacrolimus 4 mg Tb24 extended release tablet  Commonly known as: ENVARSUS XR  Take 2 tablets (8 mg) by mouth daily.     tacrolimus 1 mg Tb24 extended release tablet  Commonly known as: ENVARSUS XR  Take 2 tablets (2 mg) by mouth daily.     valGANciclovir 450 mg tablet  Commonly known as: VALCYTE  Take 1 tablet (450 mg total) by mouth daily.        CHANGE how you take these medications    amLODIPine 10 MG tablet  Commonly known as: NORVASC  Take 1 tablet (10 mg total) by mouth daily.  What changed:   ?? medication strength  ?? how much to take     carvediloL 12.5 MG tablet  Commonly known as: COREG  Take 2 tablets (25 mg total) by mouth Two (2) times a day.  What changed:   ?? how much to take  ?? how to take this  ?? when to take this  ?? additional instructions     cloNIDine HCL 0.1 MG tablet  Commonly known as: CATAPRES  Take 2 tablets (0.2 mg total) by mouth every eight (8) hours.  What changed:   ?? how much to take  ?? when to take this        CONTINUE taking these medications albuterol 90 mcg/actuation inhaler  Commonly known as: PROVENTIL HFA;VENTOLIN HFA  Inhale 2 puffs every six (6) hours as needed for wheezing.     aspirin 81 MG tablet  Commonly known as: ECOTRIN  Take 81 mg by mouth daily.     CONTOUR TEST STRIPS Strp  Generic drug: blood sugar diagnostic  TEST BLOOD SUGARS 2 TIMES DAILY     LANCETS,THIN MISC  Using at home to test blood sugars twice daily.     NITROSTAT 0.4 MG SL tablet  Generic drug: nitroglycerin  Place 0.4 mg under the tongue daily as needed.     ONETOUCH ULTRA SYSTEM KIT kit  Generic drug: blood-glucose meter  Please dispense for patient to use at home to check blood sugars.     pregabalin 75 MG capsule  Commonly known as: LYRICA  Take 75 mg by mouth daily.            Pending Test Results:   N/A    Discharge Instructions:     Activity: As tolerated, no lifting >15 lbs for at least 6 weeks     Diet: As tolerated     Other Instructions:  Other Instructions     Discharge instructions      Activity: Do not lift > 10-15 lbs for 1st 6 weeks, then gradually increase to 25 lbs over the following 6 weeks. Resume heavy lifting only after being cleared to do so at follow-up appointment in Transplant Surgery clinic.    Diet: Regular    Your Post-Transplant Coordinator is Philis Nettle. Contact your transplant coordinator or the Transplant Surgery Office [phone]) during business hours or page the transplant coordinator on call [phone]) after business hours for:    - fever >100.5 degrees F by mouth, any fever with shaking chills, or other signs or symptoms of infection   - uncontrolled nausea, vomiting, or diarrhea; inability to have a bowel movement for > 3 days.   - any problem that prevents taking medications as scheduled.   - pain uncontrolled with prescribed medication or new pain or tenderness at the surgical site   - sudden weight gain or increase in blood pressure (greater than 140/85)   - shortness of breath, chest pain / discomfort - new or increasing jaundice   - urinary symptoms including pain / difficulty / burning or tea-colored urine   - any other new or concerning symptoms   - questions regarding your medications or continuing care      Patient may shower, but should not immerse wounds in bath or pool for 2-3 weeks. Wash the surgical site with mild soap and water, but do not scrub vigorously.    You may dress wounds with dry gauze and tape to avoid soilage.    Do not drive or operate heavy machinery prior to MD clearance, or at any time while taking narcotics.    Inspect surgical sites at least twice daily, contact Transplant Coordinator for spreading redness, purulent discharge, or increasing bleeding or drainage, or for separation of wounds.     Maintain a written record of daily vital signs, per Handbook instructions.     Maintain a written record of medications taken and review against the discharge medications sheet (orange paper). Periodically review your Transplant Handbook for important information regarding postoperative care and required precautions.      Labs and Other Follow-ups after Discharge:   Kidney  Labs 3x week: CBC, BMP, Mg, Phos and Tacrolimus trough  Labs every 3 months: Hepatic function panel    Transplant Coordinator:  Philis Nettle- phone: [phone] fax: [phone]               Labs and Other  Follow-ups after Discharge:  Follow Up instructions and Outpatient Referrals     Ambulatory referral to Home Health      Is this a [facility] referral?: Yes    Is this patient at high risk for COVID 19 transmission and recommended to stay at home during this pandemic?: No    Does your patient need 7-10 touchpoints in the first 7 days of care including virtual visit with a provider with 36 hours? (Must be [facility] and must choose Virtual Provider to follow the patient for up to 60 days): No    Physician to follow patient's care: Referring Provider    Disciplines requested: Physical Therapy Physical Therapy requested: Evaluate and treat    Requested start of care date: Day after Discharge/Referral    Discharge instructions          Future Appointments:  Appointments which have been scheduled for you    Apr 11, 2019  9:15 AM  (Arrive by 8:45 AM)  LAB ONLY with SURTRA LAB ONLY  [facility] TRANSPLANT SURGERY [facility] REGION) [address]  [place] [address]  [phone]      Apr 11, 2019  9:40 AM  (Arrive by 9:10 AM)  Nila Nephew W MD with Wallace Cullens Mincemoyer, CPP  [facility] TRANSPLANT SURGERY [facility] REGION) [address]  [place] [address]  [phone]      Apr 11, 2019 11:20 AM  (Arrive by 10:50 AM)  RETURN 15 with Loney Hering, MD  [facility] TRANSPLANT SURGERY [facility] REGION) [address]  [place] [address]  [phone]      May 10, 2019 10:00 AM  (Arrive by 9:30 AM)  LAB ONLY with SURTRA LAB ONLY  [facility] TRANSPLANT SURGERY [facility] REGION) [address]  [place] [address]  [phone]      May 10, 2019 10:30 AM  (Arrive by 10:00 AM)  RETURN PHARMD with Wallace Cullens Mincemoyer, CPP  [facility] KIDNEY TRANSPLANT [place] [facility] REGION) [address]  [place] [address]  [phone]      May 10, 2019 11:00 AM  (Arrive by 10:30 AM)  RETURN  GENERAL with Cordelia Poche, AGNP  [facility] KIDNEY TRANSPLANT [place] [facility] REGION) [address]  [place] [address]  [phone]      May 16, 2019  3:00 PM  (Arrive by 2:45 PM)  CYSTO LOCAL with Turner Daniels, MD  [facility] UROLOGY PROCEDURES [facility]) [address]  [place] [address]  [phone]           Leona Singleton, MD  Surgery Resident, PGY-3  04/07/19 13:07

## 2019-04-08 DIAGNOSIS — Z0000 Encounter for general adult medical examination without abnormal findings: Principal | ICD-10-CM

## 2019-04-08 DIAGNOSIS — Z940 Kidney transplant status: Principal | ICD-10-CM

## 2019-04-08 DIAGNOSIS — Z79899 Other long term (current) drug therapy: Secondary | ICD-10-CM | POA: Diagnosis not present

## 2019-04-08 LAB — CBC W/ DIFFERENTIAL
BANDED NEUTROPHILS ABSOLUTE COUNT: 0.1 10*3/uL (ref 0.0–0.1)
BASOPHILS RELATIVE PERCENT: 0 %
EOSINOPHILS RELATIVE PERCENT: 0 %
HEMATOCRIT: 23.1 % — ABNORMAL LOW (ref 34.0–46.6)
HEMOGLOBIN: 8 g/dL — ABNORMAL LOW (ref 11.1–15.9)
IMMATURE GRANULOCYTES: 1 %
LYMPHOCYTES ABSOLUTE COUNT: 0 10*3/uL — ABNORMAL LOW (ref 0.7–3.1)
LYMPHOCYTES RELATIVE PERCENT: 0 %
MEAN CORPUSCULAR HEMOGLOBIN CONC: 34.6 g/dL (ref 31.5–35.7)
MEAN CORPUSCULAR HEMOGLOBIN: 33.2 pg — ABNORMAL HIGH (ref 26.6–33.0)
MEAN CORPUSCULAR VOLUME: 96 fL (ref 79–97)
MONOCYTES ABSOLUTE COUNT: 0.2 10*3/uL (ref 0.1–0.9)
MONOCYTES RELATIVE PERCENT: 2 %
NEUTROPHILS ABSOLUTE COUNT: 10.5 10*3/uL — ABNORMAL HIGH (ref 1.4–7.0)
NEUTROPHILS RELATIVE PERCENT: 97 %
PLATELET COUNT: 190 10*3/uL (ref 150–450)
RED BLOOD CELL COUNT: 2.41 x10E6/uL — CL (ref 3.77–5.28)
RED CELL DISTRIBUTION WIDTH: 14.4 % (ref 11.7–15.4)
WHITE BLOOD CELL COUNT: 10.9 10*3/uL — ABNORMAL HIGH (ref 3.4–10.8)

## 2019-04-08 LAB — PLATELET COUNT: Platelets:NCnc:Pt:Bld:Qn:Automated count: 190

## 2019-04-08 NOTE — Unmapped (Signed)
Transparent red urine noted and she wanted to let us know. She has been walking more than before. I told her if it doesn't clear up in the next couple of hours to call the on-call coordinator. She also reports emesis x 1 that was green and feeling full. She hasn't had a BM today and had stopped taking her Miralax. I told her to only drink fluids until she has a BM and to take some Miralax. I let her know that her coordinator or I will call her this afternoon to see if she is feeling better. Loma Boston, RN Inpatient Transplant Nurse Coordinator 04/08/2019 11:43 AM

## 2019-04-08 NOTE — Unmapped (Signed)
Called patient to check in    Reports she is doing well. Drinking 4 (20 oz) bottles of water a day.  Peeing about every two hours. Was concerned urine and some blood in it earlier but is better now.BP 160/60    Has not had a BM today. Had a large amount of diarrhea yesterday with some formed stool. Will take senna tonight with Colace and use Miralax again tomorrow.  Encouraged walking, more hydration, and staying away from greasy or spicy food.  Has some reflux and will try to take OTC pepcid.  Encouraged her to try tylenol for pain and add oxycodone as needed for more severe pain and that it will contribute to constipation.    Will call on call coordinator if she has no BM tomorrow or n/v.    Went over Monday apt and what to bring.    Denies any other questions or concerns

## 2019-04-08 NOTE — Unmapped (Signed)
Transplant Nephrology Clinic Visit      History of Present Illness    63 y.o. female here for follow up after kidney transplantation.  Patient has ESRD secondary to DM II.  Patient has been on dialysis since 02/07/2014.  Patient's history includes HTN, CAD, CHF, HLD, COPD, PVD, sickle cell trait, and chronic back pain.     Patient was admitted for kidney transplant on 03/28/2019.  She received 1 unit pRBC on 03/30/2019 and 04/03/2019.  Patient had delayed graft function after transplant, but no dialysis was needed. RUS on 03/30/2019 showed 1.8 cm hypoechoic fluid collection abutting the left lower pole of the transplant kidney likely representing postsurgical seroma.  Patient discharged on 04/06/2019 to her home.       Transplant History:    Organ Received: Left DDKT, DBD, SCD, KDPI: 70%  Resp culture: + strep pneumo  Native Kidney Disease: DM II  Date of Transplant: 03/30/2019  Post-Transplant Course: DGF  Prior Transplants: No  Induction: [place]  Date of Ureteral Stent Removal: 05/09/2019  Current Immunosuppression: Tacrolimus/Myfortic  CMV/EBV Status: CMV D-/R+  EBV D+/R+  Rejection Episodes: None  Donor Specific Antibodies: None  Results of Renal Imaging (pre and post):      Pre-Txp 07/07/17  -Kidneys are small with mildly increased echogenicity bilaterally. No solid masses or calculi in the visualized kidneys. No hydronephrosis    CT Abdomen/Pelvis w/o Contrast 03/29/2019 (pre-txp)  -1.2 cm hypodensity in the right kidney lower pole (2:48), increased in size from prior however incompletely characterized given lack of intravenous contrast.  -Uterus is surgically absent.    Post-Txp (txp kidney only) 03/30/2019  -Elevated resistive index in the main renal artery anastomosis, likely from postsurgical edema. Remaining resistive indices in the renal fossa are within normal limits. -1.8 cm hypoechoic fluid collection abutting the left lower pole of the transplant kidney likely representing postsurgical seroma. Additional follow-up.      Current Immunosuppression Regimen:   Envarsus 10 mg daily  Myfortic 540 mg BID      Subjective/Interval:   Since last discharge, patient has had a lot of pain.  She has chronic back pain with arthritis, but has been experiencing pain in her LLQ, where her kidney was placed.  She denies erythema, drainage, or odor.  She complains of some shortness of breath with exertion, but denies wheezing and chest pain. She feels the shortness of breath is related to the pain with movement. Patient denies dysuria, hematuria, or discharge, but notes some clenching with urination.  Patient had hematuria over the weekend but states it has stopped.  Since discharge patient having bowel movement issues.  She is taking oxycodone for pain but states it isn't really working.  She is aware that one of the side effects is constipation. After taking miralax, colace, and a nestle breakfast shake she finally had a big bowel movement that was formed and liquid. She states she did vomit once over the weekend but hasn't since.     BP at home has been running 155-180/80-90's.  This may be due to pain and bowel movement issues. Patient had a hand tremor prior to transplant but states it has gotten a bit worse. She is able to eat and drink without issues.     She is currently following with a [facility]ologist with [facility] and a Neurosurgeon at [facility]. All information in care everywhere    Patient denies headaches, lightheadedness, dizziness, fever/chills, edema.     Last dose of Envarsus: 9:00am  Past Medical History  1. DM II  2. HTN  3. HLD  4. CAD  5. CHF  6. COPD  7. PVD  8. Sickle Cell Trait  9. DDD with chronic pain    Review of Systems      Otherwise as per HPI, all other systems reviewed and are negative.    Medications  Current Outpatient Medications   Medication Sig Dispense Refill ??? acetaminophen (TYLENOL) 500 MG tablet Take 1-2 tablets (500-1,000 mg total) by mouth every six (6) hours as needed for pain or fever (> 38C or 100.45F). 30 tablet 0   ??? albuterol (PROVENTIL HFA;VENTOLIN HFA) 90 mcg/actuation inhaler Inhale 2 puffs every six (6) hours as needed for wheezing.     ??? amLODIPine (NORVASC) 10 MG tablet Take 1 tablet (10 mg total) by mouth daily. 30 tablet 11   ??? aspirin (ECOTRIN) 81 MG tablet Take 81 mg by mouth daily.     ??? blood sugar diagnostic (CONTOUR TEST STRIPS) Strp TEST BLOOD SUGARS 2 TIMES DAILY     ??? blood-glucose meter (ONETOUCH ULTRA SYSTEM KIT) kit Please dispense for patient to use at home to check blood sugars.     ??? carvediloL (COREG) 12.5 MG tablet Take 2 tablets (25 mg total) by mouth Two (2) times a day. 120 tablet 11   ??? cloNIDine HCL (CATAPRES) 0.1 MG tablet Take 2 tablets (0.2 mg total) by mouth every eight (8) hours. 180 tablet 11   ??? docusate sodium (COLACE) 100 MG capsule Take 1 capsule (100 mg total) by mouth two (2) times a day as needed for constipation. 60 capsule 0   ??? LANCETS,THIN MISC Using at home to test blood sugars twice daily.     ??? magnesium oxide-Mg AA chelate (MAGNESIUM, AMINO ACID CHELATE,) 133 mg Tab Take 1 tablet by mouth Two (2) times a day. HOLD until directed to start by your coordinator. 100 tablet 11   ??? mycophenolate (MYFORTIC) 180 MG EC tablet Take 3 tablets (540 mg total) by mouth Two (2) times a day. 180 tablet 11   ??? nitroglycerin (NITROSTAT) 0.4 MG SL tablet Place 0.4 mg under the tongue daily as needed.     ??? oxyCODONE (ROXICODONE) 5 MG immediate release tablet Take 1-2 tablets (5-10 mg total) by mouth every six (6) hours as needed for pain. 40 tablet 0   ??? pantoprazole (PROTONIX) 40 MG tablet Take 1 tablet (40 mg total) by mouth daily. 30 tablet 11   ??? polyethylene glycol (GLYCOLAX) 17 gram/dose powder Mix 1 capful (17 g) in 4 to 8 ounces water, juice, soda, coffee, or tea and drink daily as needed. 510 g 0 ??? pregabalin (LYRICA) 75 MG capsule Take 75 mg by mouth daily.     ??? senna (SENOKOT) 8.6 mg tablet Take 1 tablet by mouth nightly as needed for constipation. 30 tablet 11   ??? sulfamethoxazole-trimethoprim (BACTRIM) 400-80 mg per tablet Take 1 tablet (80 mg of trimethoprim total) by mouth Every Monday, Wednesday, and Friday. 12 tablet 5   ??? tacrolimus (ENVARSUS XR) 1 mg Tb24 extended release tablet Take 2 tablets (2 mg) by mouth daily. 60 tablet 11   ??? tacrolimus (ENVARSUS XR) 4 mg Tb24 extended release tablet Take 2 tablets (8 mg) by mouth daily. 60 tablet 11   ??? valGANciclovir (VALCYTE) 450 mg tablet Take 1 tablet (450 mg total) by mouth daily. 30 tablet 2     No current facility-administered medications for this visit.  Physical Exam  BP 140/56  - Pulse 73  - Temp 36.9 ??C (98.4 ??F) (Tympanic)  - Ht 170.2 cm (5' 7)  - Wt 83 kg (183 lb)  - BMI 28.66 kg/m??   General: no acute distress  HEENT: wearing mask PERRL  Neck: neck supple, no cervical lymphadenopathy appreciated  CV: normal rate, normal rhythm, no murmur, no gallops, no rubs appreciated  Lungs: clear to auscultation bilaterally  Abdomen: soft, tender over graft site;  Staples present, c/d/i; no drainage or odor noted; no erythema; some edema noted  Extremities: left ankle with edema  Musculoskeletal: no visible deformity, normal range of motion.  Pulses: intact distally throughout  Neurologic: awake, alert, and oriented x3      Laboratory Data and Imaging reviewed in EPIC      Assessment:  63 y.o. female status post deceased donor kidney transplant on 03/30/2019 for ESRD secondary to DM II who presents for routine follow up and post-transplant care.         Recommendations/Plan:     Allograft Function: Renal function stable at 1.46 after continuing to decrease since transplant.   UPC 0.289 today. Immunosuppression Management [High Risk Medical Decision Making For Drug Therapy Requiring Intensive Monitoring For Toxicity]: Tacrolimus trough level 3.6 today. Tacrolimus increased to 12 mg daily.  Targeting tacrolimus trough levels of approximately 8-10 ng/mL.   Continue mycophenolate 540 mg BID.     Blood Pressure Management: BP 140/56 at this visit. Continue current regimen.  Will continue to monitor BP in relation to pain.      Lipid Management: Last lipid panel on 07/07/2017.  Atorvastatin restarted today.      Electrolytes: Electrolyte and metabolic parameters in acceptable range.  Will continue to monitor     Infectious Prophylaxis and Monitoring: CMV VL not drawn.  BK VL - today's pending.  WBC increased without signs of infection.  Will continue to monitor.   The patient continues on Valcyte and Bactrim prophylaxis.     Pulmonary Nodule: Seen on NM Stress 07/07/17.  Subcentimeter nodule is stable from previous study 03/18/16 and 03/07/14    Lumbar Radiculopathy: Following with [facility] with next appt 04/26/2019 with MRI of spine.  Chronic low back pain with numbness to BLE at time.  Sometimes Right foot drags as well.  Plan is for surgery in the future for Lumbosacral DDD.     Right Breast Soft Tissue Mass: no size change since 03/18/2016.  Needs an updated mammogram.    Neuropathy: Lyrica increased to BID to assist with pain at graft site.     Health Maintenance:   Mammo: 2018  Pap Smear: Hysterectomy  Colonoscopy: 08/20/2017; repeat 5 years due to multiple tubular adenomas      Immunizations:   Flu Shot: when 30 days post transplant  Prevnar 13: not noted in records from 2012 and after.  Will get after 1 year post transplant  Pneumovax: 03/23/18        Counseling:  I counseled the patient on:  The need to avoid sun exposure and the use of sunblock while outdoors given the relatively higher risk of skin malignancy in an immunosuppressed state. The need for adherence to immunosuppression medication.  Patient verbalized understanding.     Follow-Up:  Return to clinic in 2 weeks  Labs: 3 times a week  Patient informed NOT to take any CBD pills.   Patient will continue to follow-up with his primary care provider for non-transplant related issues and medication refills.  We have ordered transplant specific labs per the center's guidelines to monitor and assess for toxicities from immunosuppressant drug therapy    Patient seen by Dr. Rush Barer in clinic.  He states her clenching with urination is related to her stent.  Per Dr. Rush Barer she no longer needs to be seen in surgery clinic.       Shaneya Taketa L. Marina Goodell, MSN, APRN, AGNP-C - Kidney Transplant Nurse Practitioner  [facility]

## 2019-04-09 DIAGNOSIS — Z992 Dependence on renal dialysis: Secondary | ICD-10-CM | POA: Diagnosis not present

## 2019-04-09 DIAGNOSIS — N186 End stage renal disease: Secondary | ICD-10-CM | POA: Diagnosis not present

## 2019-04-09 LAB — URINALYSIS
BILIRUBIN UA: NEGATIVE
GLUCOSE UA: NEGATIVE
NITRITE UA: NEGATIVE
PH UA: 5.5 (ref 5.0–7.5)
SPECIFIC GRAVITY UA: 1.016 (ref 1.005–1.030)
UROBILINOGEN UA: 0.2 mg/dL (ref 0.2–1.0)

## 2019-04-09 LAB — RENAL FUNCTION PANEL
ALBUMIN: 3.4 g/dL — ABNORMAL LOW (ref 3.8–4.8)
BLOOD UREA NITROGEN: 24 mg/dL (ref 8–27)
BUN / CREAT RATIO: 11 — ABNORMAL LOW (ref 12–28)
CALCIUM: 9.2 mg/dL (ref 8.7–10.3)
CHLORIDE: 102 mmol/L (ref 96–106)
CO2: 21 mmol/L (ref 20–29)
GFR MDRD AF AMER: 26 mL/min/{1.73_m2} — ABNORMAL LOW
GLUCOSE: 126 mg/dL — ABNORMAL HIGH (ref 65–99)
PHOSPHORUS, SERUM: 2.1 mg/dL — ABNORMAL LOW (ref 3.0–4.3)
POTASSIUM: 3.8 mmol/L (ref 3.5–5.2)
SODIUM: 138 mmol/L (ref 134–144)

## 2019-04-09 LAB — MICROSCOPIC EXAMINATION: CASTS: NONE SEEN /LPF

## 2019-04-09 LAB — PROTEIN / CREATININE RATIO, URINE: PROTEIN/CREAT RATIO: 472 mg/g{creat} — ABNORMAL HIGH (ref 0–200)

## 2019-04-09 LAB — MAGNESIUM: Magnesium:MCnc:Pt:Ser/Plas:Qn:: 1.8

## 2019-04-09 LAB — PROTEIN/CREAT RATIO: Protein/Creatinine:MRto:Pt:Urine:Qn:: 472 — ABNORMAL HIGH

## 2019-04-09 LAB — GLUCOSE: Glucose:MCnc:Pt:Ser/Plas:Qn:: 126 — ABNORMAL HIGH

## 2019-04-09 LAB — SPECIFIC GRAVITY UA: Specific gravity:Rden:Pt:Urine:Qn:: 1.016

## 2019-04-09 LAB — CASTS: Casts:PrThr:Pt:Urine sed:Ord:Microscopy.light: NONE SEEN

## 2019-04-10 LAB — TACROLIMUS BLOOD: Tacrolimus:MCnc:Pt:Bld:Qn:LC/MS/MS: 4.5

## 2019-04-11 ENCOUNTER — Encounter: Admit: 2019-04-11 | Discharge: 2019-04-11 | Payer: MEDICARE

## 2019-04-11 DIAGNOSIS — Z0000 Encounter for general adult medical examination without abnormal findings: Principal | ICD-10-CM

## 2019-04-11 DIAGNOSIS — Z940 Kidney transplant status: Principal | ICD-10-CM

## 2019-04-11 DIAGNOSIS — Z79899 Other long term (current) drug therapy: Principal | ICD-10-CM

## 2019-04-11 DIAGNOSIS — D899 Disorder involving the immune mechanism, unspecified: Principal | ICD-10-CM

## 2019-04-11 DIAGNOSIS — Z6828 Body mass index (BMI) 28.0-28.9, adult: Secondary | ICD-10-CM | POA: Diagnosis not present

## 2019-04-11 DIAGNOSIS — D849 Immunodeficiency, unspecified: Secondary | ICD-10-CM | POA: Diagnosis not present

## 2019-04-11 LAB — CBC W/ AUTO DIFF
BASOPHILS ABSOLUTE COUNT: 0 10*9/L (ref 0.0–0.1)
BASOPHILS RELATIVE PERCENT: 0.2 %
EOSINOPHILS ABSOLUTE COUNT: 0.1 10*9/L (ref 0.0–0.4)
EOSINOPHILS RELATIVE PERCENT: 0.6 %
HEMATOCRIT: 25.1 % — ABNORMAL LOW (ref 36.0–46.0)
HEMOGLOBIN: 8.3 g/dL — ABNORMAL LOW (ref 12.0–16.0)
LARGE UNSTAINED CELLS: 0 % (ref 0–4)
LYMPHOCYTES RELATIVE PERCENT: 0.5 %
MEAN CORPUSCULAR HEMOGLOBIN CONC: 33 g/dL (ref 31.0–37.0)
MEAN CORPUSCULAR VOLUME: 103.2 fL — ABNORMAL HIGH (ref 80.0–100.0)
MEAN PLATELET VOLUME: 9 fL (ref 7.0–10.0)
MONOCYTES ABSOLUTE COUNT: 0.2 10*9/L (ref 0.2–0.8)
MONOCYTES RELATIVE PERCENT: 1.7 %
NEUTROPHILS ABSOLUTE COUNT: 12.1 10*9/L — ABNORMAL HIGH (ref 2.0–7.5)
NEUTROPHILS RELATIVE PERCENT: 96.7 %
PLATELET COUNT: 208 10*9/L (ref 150–440)
RED BLOOD CELL COUNT: 2.43 10*12/L — ABNORMAL LOW (ref 4.00–5.20)
RED CELL DISTRIBUTION WIDTH: 16.8 % — ABNORMAL HIGH (ref 12.0–15.0)
WBC ADJUSTED: 12.5 10*9/L — ABNORMAL HIGH (ref 4.5–11.0)

## 2019-04-11 LAB — SMEAR REVIEW

## 2019-04-11 LAB — TACROLIMUS, TROUGH: Lab: 3.6 — ABNORMAL LOW

## 2019-04-11 LAB — LEUKOCYTE ESTERASE UA: Leukocyte esterase:PrThr:Pt:Urine:Ord:Test strip: NEGATIVE

## 2019-04-11 LAB — RENAL FUNCTION PANEL
ALBUMIN: 3.6 g/dL (ref 3.5–5.0)
ANION GAP: 6 mmol/L — ABNORMAL LOW (ref 7–15)
BUN / CREAT RATIO: 8
CALCIUM: 9.5 mg/dL (ref 8.5–10.2)
CHLORIDE: 106 mmol/L (ref 98–107)
CO2: 26 mmol/L (ref 22.0–30.0)
CREATININE: 1.46 mg/dL — ABNORMAL HIGH (ref 0.60–1.00)
EGFR CKD-EPI AA FEMALE: 44 mL/min/{1.73_m2} — ABNORMAL LOW (ref >=60)
EGFR CKD-EPI NON-AA FEMALE: 39 mL/min/{1.73_m2} — ABNORMAL LOW (ref >=60)
GLUCOSE RANDOM: 129 mg/dL — ABNORMAL HIGH (ref 70–99)
PHOSPHORUS: 2.6 mg/dL — ABNORMAL LOW (ref 2.9–4.7)
POTASSIUM: 3.5 mmol/L (ref 3.5–5.0)

## 2019-04-11 LAB — URINALYSIS
BACTERIA: NONE SEEN /HPF
BILIRUBIN UA: NEGATIVE
GLUCOSE UA: NEGATIVE
KETONES UA: NEGATIVE
NITRITE UA: NEGATIVE
PH UA: 7 (ref 5.0–9.0)
RBC UA: 1 /HPF (ref <=4)
SPECIFIC GRAVITY UA: 1.006 (ref 1.003–1.030)
SQUAMOUS EPITHELIAL: <1 /HPF (ref 0–5)
UROBILINOGEN UA: 0.2
WBC UA: 1 /HPF (ref 0–5)

## 2019-04-11 LAB — PROTEIN/CREAT RATIO, URINE: Protein/Creatinine:MRto:Pt:Urine:Qn:: 0.289

## 2019-04-11 LAB — BK BLOOD QUANT: Lab: 0

## 2019-04-11 LAB — BK VIRUS QUANTITATIVE PCR, BLOOD: BK BLOOD RESULT: NOT DETECTED

## 2019-04-11 LAB — BASOPHILS ABSOLUTE COUNT: Basophils:NCnc:Pt:Bld:Qn:Automated count: 0

## 2019-04-11 LAB — CALCIUM: Calcium:MCnc:Pt:Ser/Plas:Qn:: 9.5

## 2019-04-11 LAB — MAGNESIUM
MAGNESIUM: 1.7 mg/dL (ref 1.6–2.2)
Magnesium:MCnc:Pt:Ser/Plas:Qn:: 1.7

## 2019-04-11 MED ORDER — ATORVASTATIN 40 MG TABLET: 40 mg | tablet | Freq: Every day | 11 refills | 30 days | Status: AC

## 2019-04-11 MED ORDER — TACROLIMUS ER 4 MG TABLET,EXTENDED RELEASE 24 HR
ORAL_TABLET | Freq: Every day | ORAL | 11 refills | 30.00000 days | Status: CP
Start: 2019-04-11 — End: ?

## 2019-04-11 MED ORDER — ATORVASTATIN 40 MG TABLET
ORAL_TABLET | Freq: Every day | ORAL | 11 refills | 30.00000 days | Status: CP
Start: 2019-04-11 — End: 2020-04-10

## 2019-04-11 NOTE — Unmapped (Signed)
Saw patient in clinic today with sister. Reports she is doing well but having uncontrolled pain. States oxycodone is not helping and only making her constipated.    States she has chronic back pain right lower side (disc protrusion/arthritis) and was supposed to have surgery but has had transplant.  Sees Dr Marcell Barlow in [place].    Has had constipation but had large BM Sunday that was soft and loose (not watery).    Denies HA, CP, heart palpitations, numbness/tingling, or fever    SOB with exertion mainly from pain, denies SOB with laying flat    +nausea that is better than hospital. Did throw up yesterday, has history of this.    +tremors. Tremors prior to transplant that are a little worse than before    +clenching with urination, denies pain, burning, odor, fever    Small amount of BLE, normal for patient    BP elevated 160's/80-90    Labs today and took envarsus at 9am yesterday    Incision clean and dry

## 2019-04-11 NOTE — Unmapped (Signed)
Urine sample has been collected and sent to lab

## 2019-04-11 NOTE — Unmapped (Signed)
[facility] PHARMACY NOTE  04/11/2019   Michele Cisneros  [id number]    Medication changes today:   1. Increase pregabalin to 75 mg BID  2. Increase Valcyte to 450 mg daily   3. Increase Envarsus to 12 mg daily  4. Restart atorvastatin 40 mg daily     Education/Adherence tools provided today:  1.provided updated medication list  2. provided additional pill box education  3.  provided additional education on immunosuppression and transplant related medications including reviewing indications of medications, dosing and side effects    Follow up items:  1. goal of understanding indications and dosing of immunosuppression medications  2. Consider statin at a later date    Next visit with pharmacy in 1-2 weeks  ____________________________________________________________________    Michele Cisneros is a 63 y.o. female s/p deceased kidney transplant on 04/28/19 (Kidney) 2/2 T2DM.     Other PMH significant for hypertension, T2DM, CAD s/p PCI     Seen by pharmacy today for: medication management and pill box fill and adherence education; last seen by pharmacy first visit     CC:  Patient complains of  increased neuropathic pain    There were no vitals filed for this visit.    Allergies   Allergen Reactions   ??? Hydrocodone Hives   ??? Metformin      Other reaction(s): Distress (finding)       All medications reviewed and updated.     Medication list includes revisions made during today???s encounter    Outpatient Encounter Medications as of 04/11/2019   Medication Sig Dispense Refill   ??? acetaminophen (TYLENOL) 500 MG tablet Take 1-2 tablets (500-1,000 mg total) by mouth every six (6) hours as needed for pain or fever (> 38C or 100.77F). 30 tablet 0   ??? albuterol (PROVENTIL HFA;VENTOLIN HFA) 90 mcg/actuation inhaler Inhale 2 puffs every six (6) hours as needed for wheezing.     ??? amLODIPine (NORVASC) 10 MG tablet Take 1 tablet (10 mg total) by mouth daily. 30 tablet 11 ??? aspirin (ECOTRIN) 81 MG tablet Take 81 mg by mouth daily.     ??? blood sugar diagnostic (CONTOUR TEST STRIPS) Strp TEST BLOOD SUGARS 2 TIMES DAILY     ??? blood-glucose meter (ONETOUCH ULTRA SYSTEM KIT) kit Please dispense for patient to use at home to check blood sugars.     ??? carvediloL (COREG) 12.5 MG tablet Take 2 tablets (25 mg total) by mouth Two (2) times a day. 120 tablet 11   ??? cloNIDine HCL (CATAPRES) 0.1 MG tablet Take 2 tablets (0.2 mg total) by mouth every eight (8) hours. 180 tablet 11   ??? docusate sodium (COLACE) 100 MG capsule Take 1 capsule (100 mg total) by mouth two (2) times a day as needed for constipation. 60 capsule 0   ??? LANCETS,THIN MISC Using at home to test blood sugars twice daily.     ??? magnesium oxide-Mg AA chelate (MAGNESIUM, AMINO ACID CHELATE,) 133 mg Tab Take 1 tablet by mouth Two (2) times a day. HOLD until directed to start by your coordinator. 100 tablet 11   ??? mycophenolate (MYFORTIC) 180 MG EC tablet Take 3 tablets (540 mg total) by mouth Two (2) times a day. 180 tablet 11   ??? nitroglycerin (NITROSTAT) 0.4 MG SL tablet Place 0.4 mg under the tongue daily as needed.     ??? oxyCODONE (ROXICODONE) 5 MG immediate release tablet Take 1-2 tablets (5-10 mg total) by mouth  every six (6) hours as needed for pain. 40 tablet 0   ??? pantoprazole (PROTONIX) 40 MG tablet Take 1 tablet (40 mg total) by mouth daily. 30 tablet 11   ??? polyethylene glycol (GLYCOLAX) 17 gram/dose powder Mix 1 capful (17 g) in 4 to 8 ounces water, juice, soda, coffee, or tea and drink daily as needed. 510 g 0   ??? pregabalin (LYRICA) 75 MG capsule Take 75 mg by mouth daily.     ??? senna (SENOKOT) 8.6 mg tablet Take 1 tablet by mouth nightly as needed for constipation. 30 tablet 11   ??? sulfamethoxazole-trimethoprim (BACTRIM) 400-80 mg per tablet Take 1 tablet (80 mg of trimethoprim total) by mouth Every Monday, Wednesday, and Friday. 12 tablet 5 ??? tacrolimus (ENVARSUS XR) 1 mg Tb24 extended release tablet Take 2 tablets (2 mg) by mouth daily. 60 tablet 11   ??? tacrolimus (ENVARSUS XR) 4 mg Tb24 extended release tablet Take 2 tablets (8 mg) by mouth daily. 60 tablet 11   ??? valGANciclovir (VALCYTE) 450 mg tablet Take 1 tablet (450 mg total) by mouth daily. 30 tablet 2     No facility-administered encounter medications on file as of 04/11/2019.        Induction agent : alemtuzumab    CURRENT IMMUNOSUPPRESSION: Envarsus 10 mg PO qd  prograf/Envarsus/cyclosporine goal: 8-10   [id number]  mg PO bid    steroid free     Patient complains of tremor, worsening neuropathy    IMMUNOSUPPRESSION DRUG LEVELS:  Lab Results   Component Value Date    Tacrolimus, Trough 3.5 (L) 04/06/2019    Tacrolimus, Trough 5.9 04/05/2019    Tacrolimus, Trough 2.8 (L) 04/04/2019    Tacrolimus Lvl 4.5 04/08/2019     No results found for: CYCLO  No results found for: EVEROLIMUS  No results found for: SIROLIMUS    Envarsus level is accurate 24 hour trough    Graft function: improving  DSA: ntd  Biopsies to date: zero hour biopsy - mild acute tubular injury  UPC: 0.289  WBC/ANC:  12.8/12.2 - no obvious sign of infection    Plan: Will increase Envarsus to 12 mg daily. Continue to monitor.    OI Prophylaxis:   CMV Status: D-/ R+, moderate risk. CMV prophylaxis: valganciclovir 450 mg two days per week x 3 months per protocol.  Estimated Creatinine Clearance: 44.8 mL/min (A) (based on SCr of 1.46 mg/dL (H)).  No results found for: CMVCP  PCP Prophylaxis: bactrim SS 1 tab MWF x 6 months.  Thrush: completed in hospital  Patient is  tolerating infectious prophylaxis well    Plan: Increase Valcyte to 450 mg daily. Continue to monitor.    CV Prophylaxis: asa 81 mg   The ASCVD Risk score Denman George DC Montez Hageman, et al., 2013) failed to calculate.  Statin therapy: Indicated given DM history; currently on no statin (had been on atorvastatin 40 mg daily in the past but stopped for unknown reason) Plan: Restart atorvastatin 40 mg . Continue to monitor     BP: Goal < 140/90. Clinic vitals reported above  Home BP ranges: 155-180/80-90s  Current meds include: amlodipine 10 mg, carvedilol 25 mg BID, clonidine 0.2 mg TID  Plan: out of goal but likely pain related.  No changes today but may need to increase clonidine or start thiazide type diuretic at next visit Continue to monitor    Anemia of CKD:  H/H:   Lab Results   Component Value Date    HGB  8.0 (L) 04/08/2019     Lab Results   Component Value Date    HCT 23.1 (L) 04/08/2019     Iron panel:  No results found for: IRON, TIBC, FERRITIN  No results found for: LABIRON    Prior ESA use: none post transplant    Plan: stable. Continue to monitor.     DM:   Lab Results   Component Value Date    A1C 4.1 (L) 03/28/2019   . Goal A1c < 7  History of Dm? Yes: T2DM  Established with endocrinologist/PCP for BG managment? Yes: PCP  Currently on: no meds, just TID BG checks  Home BS log:   Breakfast Lunch  Dinner    Date AC PC [facility]   04/07/2019 230  182  145    04/08/2019 124  170  175    04/09/2019 130  214  136    04/10/2019 126  189  160      Diet:did not address  Exercise:not yet  Hypoglycemia: no  Plan:  Continue BG checks for now; consider PO agent at next visit    Electrolytes: wnl  Meds currently on: none  Plan: Continue to monitor     GI/BM: pt reports constipation; occasional n/v but this is not new  Meds currently on: docusate PRN (BID), Miralax PRN, pantoprazole 40 mg daily (hearburn ok) senna PRN   Plan: Continue to monitor    Pain: pt reports significant pain, mostly neuropathic  Meds currently on: APAP PRN, oxycodone 10 mg q6h (doesn't find helpfuL0, pregabalin 75 mg daily  Plan: Increase Lyrica to 75 mg BID.  Instructed her to DC PO CBD oil.  Continue to monitor    Bone health:   Vitamin D Level: pending. Goal > 30.   Last DEXA results:  none available  Current meds include: none  Plan: Vitamin D level  pending . Continue to monitor. Women's/Men's Health:  TELISHA ZAWADZKI is a 62 y.o. Female perimenopausal. Patient reports no men's/women's health issues  Plan: Continue to monitor    Pharmacy preference:  [facility]    Adherence: Patient has poor understanding of medications; was not able to independently identify names/doses of immunosuppressants and OI meds.  Told her that she could not take CBD orally  Patient  does fill their own pill box on a regular basis at home.  Patient brought medication card:yes  Pill box:was empty  Plan: Encouraged patient to familiarize herself with emds; provided extensive adherence counseling/intervention    Spent approximately 40 minutes on educating this patient and greater than 50% was spent in direct face to face counseling regarding post transplant medication education. Questions and concerns were address to patient's satisfaction.    Patient was reviewed with Dr. Ripley Fraise, AGNP who was agreement with the stated plan:     During this visit, the following was completed:   BG log data assessment  BP log data assessment  Labs ordered and evaluated  complex treatment plan >1 DS   Patient education was completed for 11-24 minutes     All questions/concerns were addressed to the patient's satisfaction.  __________________________________________  Cleone Slim, PHARMD, CPP  [employer] TRANSPL[facility] CLINICAL PHARMACIST PRACTITIONER  PAGER [phone]

## 2019-04-11 NOTE — Unmapped (Signed)
Urine has been collected and sent to lab.  

## 2019-04-11 NOTE — Unmapped (Signed)
Per Dr Gwynneth Munson increase Envarsus to 12mg  daily      Called and spoke to sister and went over labs and need to increase her envarsus.    Rosa wrote down instructions to change dose to envarsus 12mg  daily (3 4mg  tablets every morning).      Sister to update pill box and sister when she goes over patient's house for supper.  Will also change on med sheet    Denies any other needs at this time

## 2019-04-12 DIAGNOSIS — Z940 Kidney transplant status: Principal | ICD-10-CM

## 2019-04-12 DIAGNOSIS — Z79899 Other long term (current) drug therapy: Principal | ICD-10-CM

## 2019-04-12 NOTE — Unmapped (Signed)
[facility]   Patient Onboarding/Medication Counseling    Michele Cisneros is a 63 y.o. female with kidney transplant who I am counseling today on continuation of therapy.  I am speaking to the patient.    Verified patient's date of birth / HIPAA.    Specialty medication(s) to be sent: na      Non-specialty medications/supplies to be sent: na      Medications not needed at this time: na         Valcyte (valganciclovir)    Medication & Administration     Dosage:   ? Take 1 tablet (450mg ) by mouth daily    Administration:   ? Take with food  ? Swallow the pills whole, do not break, crush, or chew    Adherence/Missed dose instructions:  ? Take a missed dose as soon as you think about it with food  ? If it is close to your next dose, skip the missed dose and go back to your normal time.  ? Do not take 2 doses at the same time or extra doses.  ? Report any missed doses to coordinator    Goals of Therapy     ? To prevent or treat CMV infection in setting of solid organ transplant    Side Effects & Monitoring Parameters     ? Common side effects  ? Headache  ? Diarrhea or constipation  ? Appetite or sleep disturbances  ? Back, muscle, joint, or belly pain  ? Weight loss  ? Dizziness  ? Muscle spasm  ? Upset stomach or vomiting    ? The following side effects should be reported to the provider:  ? Allergic reaction  (rash, hives, swelling, blistered or peeling skin, shortness of breath)  ? Infection (fever, chills, sore throat, ear/sinus pain, cough, sputum change, urinary pain, mouth sores, non-healing wounds)  ? Bleeding (cough ground vomit, blood in urine, black/red/tarry stools, unexplained bruising or bleeding)  ? Electrolyte problems (mood changes, confusion, weakness, abnormal heartbeat, seizures)  ? Kidney problems (urine changes, weight gain)  ? Yellowing skin or eyes  ? Swelling in arms, legs, stomach  ? Severe dizziness or passing out  ? Eye issues (eyesight changes, pain, or irritation) ? Night sweats    ? Monitoring parameters  ? Have eye exam as directed by doctor  ? CMV counts  ? CBC  ? Renal function  ? Pregnancy test prior to initiation    Contraindications, Warnings, & Precautions     ? BBW: severe leukopenia, neutropenia, anemia, thrombocytopenia, pancytopenia, and bone marrow failure, including aplastic anemia have been reported  ? BBW: may cause temporary or permanent inhibition of spermatogenesis and suppression of fertilty; has the potential to cause birth defects and cancers in humans  ? Female patients should have pregnancy test prior to initiation and use birth control for at least 30 days after discontinuation  ? Female patients should use a barrier contraceptive while on therapy and for 90 days after discontinuation  ? Acute renal failure  ? Not indicated for use in liver transplant recipients  ? Breastfeeding is not recommended    Drug/Food Interactions     ? Medication list reviewed in Epic. The patient was instructed to inform the care team before taking any new medications or supplements. no interactions noted that clinic isnot already monitoring.   ? Check with your doctor before getting any vaccinations (live or inactivated)    Storage, Handling Precautions, & Disposal     ?  Store at room temperature  ? Keep away from children and pets      Current Medications (including OTC/herbals), Comorbidities and Allergies     Current Outpatient Medications   Medication Sig Dispense Refill   ??? acetaminophen (TYLENOL) 500 MG tablet Take 1-2 tablets (500-1,000 mg total) by mouth every six (6) hours as needed for pain or fever (> 38C or 100.43F). 30 tablet 0   ??? albuterol (PROVENTIL HFA;VENTOLIN HFA) 90 mcg/actuation inhaler Inhale 2 puffs every six (6) hours as needed for wheezing.     ??? amLODIPine (NORVASC) 10 MG tablet Take 1 tablet (10 mg total) by mouth daily. 30 tablet 11   ??? aspirin (ECOTRIN) 81 MG tablet Take 81 mg by mouth daily. ??? atorvastatin (LIPITOR) 40 MG tablet Take 1 tablet (40 mg total) by mouth daily. 30 tablet 11   ??? blood sugar diagnostic (CONTOUR TEST STRIPS) Strp TEST BLOOD SUGARS 2 TIMES DAILY     ??? blood-glucose meter (ONETOUCH ULTRA SYSTEM KIT) kit Please dispense for patient to use at home to check blood sugars.     ??? carvediloL (COREG) 12.5 MG tablet Take 2 tablets (25 mg total) by mouth Two (2) times a day. 120 tablet 11   ??? cloNIDine HCL (CATAPRES) 0.1 MG tablet Take 2 tablets (0.2 mg total) by mouth every eight (8) hours. 180 tablet 11   ??? docusate sodium (COLACE) 100 MG capsule Take 1 capsule (100 mg total) by mouth two (2) times a day as needed for constipation. 60 capsule 0   ??? LANCETS,THIN MISC Using at home to test blood sugars twice daily.     ??? magnesium oxide-Mg AA chelate (MAGNESIUM, AMINO ACID CHELATE,) 133 mg Tab Take 1 tablet by mouth Two (2) times a day. HOLD until directed to start by your coordinator. (Patient not taking: Reported on 04/11/2019) 100 tablet 11   ??? mycophenolate (MYFORTIC) 180 MG EC tablet Take 3 tablets (540 mg total) by mouth Two (2) times a day. 180 tablet 11   ??? nitroglycerin (NITROSTAT) 0.4 MG SL tablet Place 0.4 mg under the tongue daily as needed.     ??? oxyCODONE (ROXICODONE) 5 MG immediate release tablet Take 1-2 tablets (5-10 mg total) by mouth every six (6) hours as needed for pain. 40 tablet 0   ??? pantoprazole (PROTONIX) 40 MG tablet Take 1 tablet (40 mg total) by mouth daily. 30 tablet 11   ??? polyethylene glycol (GLYCOLAX) 17 gram/dose powder Mix 1 capful (17 g) in 4 to 8 ounces water, juice, soda, coffee, or tea and drink daily as needed. 510 g 0   ??? pregabalin (LYRICA) 75 MG capsule Take 1 capsule (75 mg total) by mouth Two (2) times a day.     ??? senna (SENOKOT) 8.6 mg tablet Take 1 tablet by mouth nightly as needed for constipation. 30 tablet 11 ??? sulfamethoxazole-trimethoprim (BACTRIM) 400-80 mg per tablet Take 1 tablet (80 mg of trimethoprim total) by mouth Every Monday, Wednesday, and Friday. 12 tablet 5   ??? tacrolimus (ENVARSUS XR) 4 mg Tb24 extended release tablet Take 3 tablets (12 mg) by mouth daily. 90 tablet 11   ??? valGANciclovir (VALCYTE) 450 mg tablet Take 1 tablet (450 mg total) by mouth daily. 30 tablet 2     No current facility-administered medications for this visit.        Allergies   Allergen Reactions   ??? Hydrocodone Hives   ??? Metformin      Other reaction(s): Distress (  finding)       Patient Active Problem List   Diagnosis   ??? End stage renal disease (CMS-HCC)   ??? Diabetes mellitus, type II (CMS-HCC)   ??? Essential hypertension   ??? Hyperlipidemia   ??? Status post deceased-donor kidney transplantation   ??? Allergic rhinitis   ??? Bilateral carotid artery stenosis   ??? Atypical chest pain   ??? Anemia in chronic kidney disease (CKD)   ??? Chronic back pain   ??? Chronic diastolic heart failure (CMS-HCC)   ??? COPD (chronic obstructive pulmonary disease) (CMS-HCC)   ??? Coronary artery disease of native artery of native heart with stable angina pectoris (CMS-HCC)   ??? Neuritis or radiculitis due to rupture of lumbar intervertebral disc   ??? Dementia (CMS-HCC)   ??? OSA on CPAP   ??? Osteoarthritis of right hip   ??? Mitral regurgitation   ??? History of heart attack   ??? Diabetic retinopathy (CMS-HCC)   ??? Diabetic neuropathy (CMS-HCC)   ??? PAD (peripheral artery disease) (CMS-HCC)   ??? Pain due to onychomycosis of toenails of both feet   ??? Syncope   ??? Thyroid nodule   ??? Valvular heart disease   ??? Immunocompromised patient (CMS-HCC)       Reviewed and up to date in Epic.    Appropriateness of Therapy     Is medication and dose appropriate based on diagnosis? Yes    Baseline Quality of Life Assessment      How many days over the past month did your transplant keep you from your normal activities? 0    Financial Information Medication Assistance provided: None Required    Anticipated copay of $3.60 reviewed with patient. Verified delivery address.    Delivery Information     Scheduled delivery date: na- pt wants a call back in 2 weeks    Expected start date: pt is currently already taking    Medication will be delivered via na to the na address in [place].  This shipment will not require a signature.      Explained the services we provide at [facility] and that each month we would call to set up refills.  Stressed importance of returning phone calls so that we could ensure they receive their medications in time each month.  Informed patient that we should be setting up refills 7-10 days prior to when they will run out of medication.  A pharmacist will reach out to perform a clinical assessment periodically.  Informed patient that a welcome packet and a drug information handout will be sent.      Patient verbalized understanding of the above information as well as how to contact the pharmacy at [phone] option 4 with any questions/concerns.  The pharmacy is open Monday through Friday 8:30am-4:30pm.  A pharmacist is available 24/7 via pager to answer any clinical questions they may have.    Patient Specific Needs     ? Does the patient have any physical, cognitive, or cultural barriers? No    ? Patient prefers to have medications discussed with  Patient     ? Is the patient able to read and understand education materials at a high school level or above? Yes    ? Patient's primary language is  English     ? Is the patient high risk? Yes, patient taking a REMS drug     ? Does the patient require a Care Management Plan? No     ?  Does the patient require physician intervention or other additional services (i.e. nutrition, smoking cessation, social work)? No      Thad Ranger  [facility] Specialty Pharmacist

## 2019-04-12 NOTE — Unmapped (Signed)
[facility]   Patient Onboarding/Medication Counseling    Michele Cisneros is a 63 y.o. female with kidney transplant who I am counseling today on continuation of therapy.  I am speaking to the patient.    Verified patient's date of birth / HIPAA.    Specialty medication(s) to be sent: na      Non-specialty medications/supplies to be sent: na      Medications not needed at this time: na         Envarsus (tacrolimus XR)    Medication & Administration     Dosage: take 3 tablets (12mg  total) by mouth once daily     Administration:   ? Take in the morning on empty stomach ??? 1 hour before or 2 hours after breakfast  ? Take with drink of water  ? Do not crush, break, or chew tablets    Adherence/Missed dose instructions:  ? Take a missed dose as soon as you think about it.  ? If it has been more than 15 hours since missed dose, skip the missed dose and go back to your regular time  ? Report all missed doses to transplant coordinator    Goals of Therapy     ? To prevent organ rejection    Side Effects & Monitoring Parameters     ? Common side effects  ? Fatigue  ? Headache  ? Stuffy nose or sore throat  ? Nausea, vomiting, stomach pain, diarrhea, constipation  ? Heartburn  ? Back or joint pain  ? Increased risk of infection    ? The following side effects should be reported to the provider:  ? Allergic reaction  ? Kidney issues (change in quantity or urine passed, blood in urine, or weight gain)  ? High blood pressure (dizziness, change in eyesight, headache)  ? Electrolyte issues (change in mood, confusion, muscle pain, or weakness)  ? Abnormal breathing  ? Shakiness  ? Unexplained bleeding or bruising (gums bleeding, blood in urine, nosebleeds, any abnormal bleeding)  ? Signs of infection    ? Monitoring Parameters  ? Renal function  ? Liver function  ? Glucose levels  ? Blood pressure  ? Tacrolimus trough levels      Contraindications, Warnings, & Precautions ? Black Box Warning: Infections - immunosuppressant agents increase the risk of infection that may lead to hospitalization or death  ? Black Box Warning: Malignancy - immunosuppressant agents may be associated with the development of malignancies that may lead to hospitalization or death.  Limit or avoid sun and ultraviolet light exposure, use appropriate sun protection  ? Myocardial hypertrophy -avoid use in patients with congenital long QT syndrome  ? Diabetes mellitus - the risk for new-onset diabetes and insulin-dependent post-transplant diabetes mellitus is increased with tacrolimus use after transplantation  ? GI perforation  ? Hyperkalemia  ? Hypertension  ? Nephrotoxicity  ? Neurotoxicity    Drug/Food Interactions     ? Medication list reviewed in Epic. no interactions noted that clinic is not already monitoring.   ? Due to amount and severity of drug interactions, report ALL medications starts, discontinuations, and changes to transplant coordinator prior to making the change  ? Avoid alcohol  ? Avoid grapefruit or grapefruit juice  ? Avoid live vaccines    Storage, Handling Precautions, & Disposal     ? Store at room temperature  ? Keep away from children and pets      Current Medications (including OTC/herbals), Comorbidities and Allergies  Current Outpatient Medications   Medication Sig Dispense Refill   ??? acetaminophen (TYLENOL) 500 MG tablet Take 1-2 tablets (500-1,000 mg total) by mouth every six (6) hours as needed for pain or fever (> 38C or 100.73F). 30 tablet 0   ??? albuterol (PROVENTIL HFA;VENTOLIN HFA) 90 mcg/actuation inhaler Inhale 2 puffs every six (6) hours as needed for wheezing.     ??? amLODIPine (NORVASC) 10 MG tablet Take 1 tablet (10 mg total) by mouth daily. 30 tablet 11   ??? aspirin (ECOTRIN) 81 MG tablet Take 81 mg by mouth daily.     ??? atorvastatin (LIPITOR) 40 MG tablet Take 1 tablet (40 mg total) by mouth daily. 30 tablet 11 ??? blood sugar diagnostic (CONTOUR TEST STRIPS) Strp TEST BLOOD SUGARS 2 TIMES DAILY     ??? blood-glucose meter (ONETOUCH ULTRA SYSTEM KIT) kit Please dispense for patient to use at home to check blood sugars.     ??? carvediloL (COREG) 12.5 MG tablet Take 2 tablets (25 mg total) by mouth Two (2) times a day. 120 tablet 11   ??? cloNIDine HCL (CATAPRES) 0.1 MG tablet Take 2 tablets (0.2 mg total) by mouth every eight (8) hours. 180 tablet 11   ??? docusate sodium (COLACE) 100 MG capsule Take 1 capsule (100 mg total) by mouth two (2) times a day as needed for constipation. 60 capsule 0   ??? LANCETS,THIN MISC Using at home to test blood sugars twice daily.     ??? magnesium oxide-Mg AA chelate (MAGNESIUM, AMINO ACID CHELATE,) 133 mg Tab Take 1 tablet by mouth Two (2) times a day. HOLD until directed to start by your coordinator. (Patient not taking: Reported on 04/11/2019) 100 tablet 11   ??? mycophenolate (MYFORTIC) 180 MG EC tablet Take 3 tablets (540 mg total) by mouth Two (2) times a day. 180 tablet 11   ??? nitroglycerin (NITROSTAT) 0.4 MG SL tablet Place 0.4 mg under the tongue daily as needed.     ??? oxyCODONE (ROXICODONE) 5 MG immediate release tablet Take 1-2 tablets (5-10 mg total) by mouth every six (6) hours as needed for pain. 40 tablet 0   ??? pantoprazole (PROTONIX) 40 MG tablet Take 1 tablet (40 mg total) by mouth daily. 30 tablet 11   ??? polyethylene glycol (GLYCOLAX) 17 gram/dose powder Mix 1 capful (17 g) in 4 to 8 ounces water, juice, soda, coffee, or tea and drink daily as needed. 510 g 0   ??? pregabalin (LYRICA) 75 MG capsule Take 1 capsule (75 mg total) by mouth Two (2) times a day.     ??? senna (SENOKOT) 8.6 mg tablet Take 1 tablet by mouth nightly as needed for constipation. 30 tablet 11   ??? sulfamethoxazole-trimethoprim (BACTRIM) 400-80 mg per tablet Take 1 tablet (80 mg of trimethoprim total) by mouth Every Monday, Wednesday, and Friday. 12 tablet 5 ??? tacrolimus (ENVARSUS XR) 4 mg Tb24 extended release tablet Take 3 tablets (12 mg) by mouth daily. 90 tablet 11   ??? valGANciclovir (VALCYTE) 450 mg tablet Take 1 tablet (450 mg total) by mouth daily. 30 tablet 2     No current facility-administered medications for this visit.        Allergies   Allergen Reactions   ??? Hydrocodone Hives   ??? Metformin      Other reaction(s): Distress (finding)       Patient Active Problem List   Diagnosis   ??? End stage renal disease (CMS-HCC)   ??? Diabetes  mellitus, type II (CMS-HCC)   ??? Essential hypertension   ??? Hyperlipidemia   ??? Status post deceased-donor kidney transplantation   ??? Allergic rhinitis   ??? Bilateral carotid artery stenosis   ??? Atypical chest pain   ??? Anemia in chronic kidney disease (CKD)   ??? Chronic back pain   ??? Chronic diastolic heart failure (CMS-HCC)   ??? COPD (chronic obstructive pulmonary disease) (CMS-HCC)   ??? Coronary artery disease of native artery of native heart with stable angina pectoris (CMS-HCC)   ??? Neuritis or radiculitis due to rupture of lumbar intervertebral disc   ??? Dementia (CMS-HCC)   ??? OSA on CPAP   ??? Osteoarthritis of right hip   ??? Mitral regurgitation   ??? History of heart attack   ??? Diabetic retinopathy (CMS-HCC)   ??? Diabetic neuropathy (CMS-HCC)   ??? PAD (peripheral artery disease) (CMS-HCC)   ??? Pain due to onychomycosis of toenails of both feet   ??? Syncope   ??? Thyroid nodule   ??? Valvular heart disease   ??? Immunocompromised patient (CMS-HCC)       Reviewed and up to date in Epic.    Appropriateness of Therapy     Is medication and dose appropriate based on diagnosis? Yes    Baseline Quality of Life Assessment      How many days over the past month did your transplant keep you from your normal activities? 0    Financial Information     Medication Assistance provided: None Required    Anticipated copay of $0 on part b reviewed with patient. Verified delivery address.    Delivery Information Scheduled delivery date: na - pt wants call back in 2 weeks    Expected start date: pt is currently already taking    Medication will be delivered via na to the na address in [place].  This shipment will require a signature.      Explained the services we provide at [facility] and that each month we would call to set up refills.  Stressed importance of returning phone calls so that we could ensure they receive their medications in time each month.  Informed patient that we should be setting up refills 7-10 days prior to when they will run out of medication.  A pharmacist will reach out to perform a clinical assessment periodically.  Informed patient that a welcome packet and a drug information handout will be sent.      Patient verbalized understanding of the above information as well as how to contact the pharmacy at [phone] option 4 with any questions/concerns.  The pharmacy is open Monday through Friday 8:30am-4:30pm.  A pharmacist is available 24/7 via pager to answer any clinical questions they may have.    Patient Specific Needs     ? Does the patient have any physical, cognitive, or cultural barriers? No    ? Patient prefers to have medications discussed with  Patient     ? Is the patient able to read and understand education materials at a high school level or above? Yes    ? Patient's primary language is  English     ? Is the patient high risk? Yes, patient taking a REMS drug     ? Does the patient require a Care Management Plan? No     ? Does the patient require physician intervention or other additional services (i.e. nutrition, smoking cessation, social work)? No      Corrie Dandy  Clotilde Dieter  [facility] Specialty Pharmacist

## 2019-04-12 NOTE — Unmapped (Signed)
Envarsus dose decrease  Last filled 6 days ago  Copay = $0

## 2019-04-12 NOTE — Unmapped (Signed)
[facility]   Patient Onboarding/Medication Counseling    Michele Cisneros is a 63 y.o. female with KIDNEY TRANSPLANT who I am counseling today on continuation of therapy.  I am speaking to the patient.    Verified patient's date of birth / HIPAA.    Specialty medication(s) to be sent: NA      Non-specialty medications/supplies to be sent: NA      Medications not needed at this time: NA         Myfortic (mycophenolic acid)    Medication & Administration     Dosage:   ? TAKE 3 tablets (540mg  total) by mouth twice daily    Administration:   ? Take with or without food, although taking with food helps minimize GI side effects.  ? Swallow the pills whole, do not chew or crush    Adherence/Missed dose instructions:  ? Take a missed dose as soon as you think about it.  ? If it is less than 2 hours until your next dose, skip the missed dose and go back to your normal time.  ? Do not take 2 doses at the same time or extra doses.    Goals of Therapy     ? To prevent organ rejection    Side Effects & Monitoring Parameters     ? Common side effects  ? Back or joint pain  ? Constipation  ? Headache/dizziness  ? Not hungry  ? Stomach pain, diarrhea, constipation, gas, upset stomach, vomiting, nausea  ? Feeling tired or weak  ? Shakiness  ? Trouble sleeping  ? Increased risk of infection    ? The following side effects should be reported to the provider:  ? Allergic reaction  ? High blood sugar (confusion, feeling sleepy, more thirst, more hungry, passing urine more often, flushing, fast breathing, or breath that smells like fruit)  ? Electrolyte issues (mood changes, confusion, muscle pain or weakness, a heartbeat that does not feel normal, seizures, not hungry, or very bad upset stomach or throwing up)  ? High or low blood pressure (bad headache or dizziness, passing out, or change in eyesight)  ? Kidney issues (unable to pass urine, change in how much urine is passed, blood in the urine, or a big weight gain) ? Skin (oozing, heat, swelling, redness, or pain), UTI and other infections   ? Chest pain or pressure  ? Abnormal heartbeat  ? Unexplained bleeding or bruising  ? Abnormal burning, numbness, or tingling  ? Muscle cramps,  ? Yellowing of skin or eyes    ? Monitoring parameters  ? Pregnancy test initially prior to treatment and 8-10 days later then as needed)  ? CBC weekly for first month then twice monthly for next 2 months, then monthly)  ? Monitor Renal and liver functions  ? Signs of organ rejection    Contraindications, Warnings, & Precautions     ? *This is a REMS drug and an FDA-approved patient medication guide will be printed with each dispensation  ? Black Box Warning: Infections   ? Black Box Warning: Lymphoproliferative disorders - risk of development of lymphoma and skin malignancy is increased  ? Black Box Warning: Use during pregnancy is associated with increased risks of first trimester pregnancy loss and congenital malformations.   ? Black Box Warning: Females of reproductive potential should use contraception during treatment and for 6 weeks after therapy is discontinued  ? CNS depression  ? New or reactivated viral infections  ?  Neutropenia  ? Female patients and/or their female partners should use effective contraception during treatment of the female patient and for at least 3 months after last dose.  ? Breastfeeding is not recommended during therapy and for 6 weeks after last dose    Drug/Food Interactions     ? Medication list reviewed in Epic. The patient was instructed to inform the care team before taking any new medications or supplements. no interactions noted that clinic is not already monitoring.   ? Do not take Echinacea while on this medication  ? Check with your doctor before getting any vaccinations (live or inactivated)    Storage, Handling Precautions, & Disposal     ? Store at room temperature  ? Keep away from children and pets ? This drug is considered hazardous and should be handled as little as possible.  Wash hands before and after touching pills. If someone else helps with medication administration, they should wear gloves.      Current Medications (including OTC/herbals), Comorbidities and Allergies     Current Outpatient Medications   Medication Sig Dispense Refill   ??? acetaminophen (TYLENOL) 500 MG tablet Take 1-2 tablets (500-1,000 mg total) by mouth every six (6) hours as needed for pain or fever (> 38C or 100.33F). 30 tablet 0   ??? albuterol (PROVENTIL HFA;VENTOLIN HFA) 90 mcg/actuation inhaler Inhale 2 puffs every six (6) hours as needed for wheezing.     ??? amLODIPine (NORVASC) 10 MG tablet Take 1 tablet (10 mg total) by mouth daily. 30 tablet 11   ??? aspirin (ECOTRIN) 81 MG tablet Take 81 mg by mouth daily.     ??? atorvastatin (LIPITOR) 40 MG tablet Take 1 tablet (40 mg total) by mouth daily. 30 tablet 11   ??? blood sugar diagnostic (CONTOUR TEST STRIPS) Strp TEST BLOOD SUGARS 2 TIMES DAILY     ??? blood-glucose meter (ONETOUCH ULTRA SYSTEM KIT) kit Please dispense for patient to use at home to check blood sugars.     ??? carvediloL (COREG) 12.5 MG tablet Take 2 tablets (25 mg total) by mouth Two (2) times a day. 120 tablet 11   ??? cloNIDine HCL (CATAPRES) 0.1 MG tablet Take 2 tablets (0.2 mg total) by mouth every eight (8) hours. 180 tablet 11   ??? docusate sodium (COLACE) 100 MG capsule Take 1 capsule (100 mg total) by mouth two (2) times a day as needed for constipation. 60 capsule 0   ??? LANCETS,THIN MISC Using at home to test blood sugars twice daily.     ??? magnesium oxide-Mg AA chelate (MAGNESIUM, AMINO ACID CHELATE,) 133 mg Tab Take 1 tablet by mouth Two (2) times a day. HOLD until directed to start by your coordinator. (Patient not taking: Reported on 04/11/2019) 100 tablet 11   ??? mycophenolate (MYFORTIC) 180 MG EC tablet Take 3 tablets (540 mg total) by mouth Two (2) times a day. 180 tablet 11 ??? nitroglycerin (NITROSTAT) 0.4 MG SL tablet Place 0.4 mg under the tongue daily as needed.     ??? oxyCODONE (ROXICODONE) 5 MG immediate release tablet Take 1-2 tablets (5-10 mg total) by mouth every six (6) hours as needed for pain. 40 tablet 0   ??? pantoprazole (PROTONIX) 40 MG tablet Take 1 tablet (40 mg total) by mouth daily. 30 tablet 11   ??? polyethylene glycol (GLYCOLAX) 17 gram/dose powder Mix 1 capful (17 g) in 4 to 8 ounces water, juice, soda, coffee, or tea and drink daily as needed.  510 g 0   ??? pregabalin (LYRICA) 75 MG capsule Take 1 capsule (75 mg total) by mouth Two (2) times a day.     ??? senna (SENOKOT) 8.6 mg tablet Take 1 tablet by mouth nightly as needed for constipation. 30 tablet 11   ??? sulfamethoxazole-trimethoprim (BACTRIM) 400-80 mg per tablet Take 1 tablet (80 mg of trimethoprim total) by mouth Every Monday, Wednesday, and Friday. 12 tablet 5   ??? tacrolimus (ENVARSUS XR) 4 mg Tb24 extended release tablet Take 3 tablets (12 mg) by mouth daily. 90 tablet 11   ??? valGANciclovir (VALCYTE) 450 mg tablet Take 1 tablet (450 mg total) by mouth daily. 30 tablet 2     No current facility-administered medications for this visit.        Allergies   Allergen Reactions   ??? Hydrocodone Hives   ??? Metformin      Other reaction(s): Distress (finding)       Patient Active Problem List   Diagnosis   ??? End stage renal disease (CMS-HCC)   ??? Diabetes mellitus, type II (CMS-HCC)   ??? Essential hypertension   ??? Hyperlipidemia   ??? Status post deceased-donor kidney transplantation   ??? Allergic rhinitis   ??? Bilateral carotid artery stenosis   ??? Atypical chest pain   ??? Anemia in chronic kidney disease (CKD)   ??? Chronic back pain   ??? Chronic diastolic heart failure (CMS-HCC)   ??? COPD (chronic obstructive pulmonary disease) (CMS-HCC)   ??? Coronary artery disease of native artery of native heart with stable angina pectoris (CMS-HCC)   ??? Neuritis or radiculitis due to rupture of lumbar intervertebral disc   ??? Dementia (CMS-HCC) ??? OSA on CPAP   ??? Osteoarthritis of right hip   ??? Mitral regurgitation   ??? History of heart attack   ??? Diabetic retinopathy (CMS-HCC)   ??? Diabetic neuropathy (CMS-HCC)   ??? PAD (peripheral artery disease) (CMS-HCC)   ??? Pain due to onychomycosis of toenails of both feet   ??? Syncope   ??? Thyroid nodule   ??? Valvular heart disease   ??? Immunocompromised patient (CMS-HCC)       Reviewed and up to date in Epic.    Appropriateness of Therapy     Is medication and dose appropriate based on diagnosis? Yes    Baseline Quality of Life Assessment      How many days over the past month did your transplant keep you from your normal activities? 0    Financial Information     Medication Assistance provided: None Required    Anticipated copay of $0 on part b reviewed with patient. Verified delivery address.    Delivery Information     Scheduled delivery date: na- pt wants call back in 2 week    Expected start date: pt is currently taking    Medication will be delivered via na to the na address in [place].  This shipment will require a signature.      Explained the services we provide at [facility] and that each month we would call to set up refills.  Stressed importance of returning phone calls so that we could ensure they receive their medications in time each month.  Informed patient that we should be setting up refills 7-10 days prior to when they will run out of medication.  A pharmacist will reach out to perform a clinical assessment periodically.  Informed patient that a welcome packet and a drug  information handout will be sent.      Patient verbalized understanding of the above information as well as how to contact the pharmacy at [phone] option 4 with any questions/concerns.  The pharmacy is open Monday through Friday 8:30am-4:30pm.  A pharmacist is available 24/7 via pager to answer any clinical questions they may have.    Patient Specific Needs ? Does the patient have any physical, cognitive, or cultural barriers? No    ? Patient prefers to have medications discussed with  Patient     ? Is the patient able to read and understand education materials at a high school level or above? Yes    ? Patient's primary language is  English     ? Is the patient high risk? Yes, patient taking a REMS drug     ? Does the patient require a Care Management Plan? No     ? Does the patient require physician intervention or other additional services (i.e. nutrition, smoking cessation, social work)? No      Thad Ranger  [facility] Specialty Pharmacist

## 2019-04-13 DIAGNOSIS — Z940 Kidney transplant status: Secondary | ICD-10-CM | POA: Diagnosis not present

## 2019-04-13 DIAGNOSIS — Z79899 Other long term (current) drug therapy: Secondary | ICD-10-CM | POA: Diagnosis not present

## 2019-04-14 ENCOUNTER — Telehealth: Payer: Self-pay | Admitting: Internal Medicine

## 2019-04-14 DIAGNOSIS — Z940 Kidney transplant status: Principal | ICD-10-CM

## 2019-04-14 DIAGNOSIS — Z79899 Other long term (current) drug therapy: Principal | ICD-10-CM

## 2019-04-14 DIAGNOSIS — I120 Hypertensive chronic kidney disease with stage 5 chronic kidney disease or end stage renal disease: Secondary | ICD-10-CM | POA: Diagnosis not present

## 2019-04-14 DIAGNOSIS — E1122 Type 2 diabetes mellitus with diabetic chronic kidney disease: Secondary | ICD-10-CM | POA: Diagnosis not present

## 2019-04-14 DIAGNOSIS — Z87891 Personal history of nicotine dependence: Secondary | ICD-10-CM | POA: Diagnosis not present

## 2019-04-14 DIAGNOSIS — Z992 Dependence on renal dialysis: Secondary | ICD-10-CM | POA: Diagnosis not present

## 2019-04-14 DIAGNOSIS — Z7982 Long term (current) use of aspirin: Secondary | ICD-10-CM | POA: Diagnosis not present

## 2019-04-14 DIAGNOSIS — Z7951 Long term (current) use of inhaled steroids: Secondary | ICD-10-CM | POA: Diagnosis not present

## 2019-04-14 DIAGNOSIS — N186 End stage renal disease: Secondary | ICD-10-CM | POA: Diagnosis not present

## 2019-04-14 DIAGNOSIS — Z4822 Encounter for aftercare following kidney transplant: Secondary | ICD-10-CM | POA: Diagnosis not present

## 2019-04-14 DIAGNOSIS — E7849 Other hyperlipidemia: Secondary | ICD-10-CM | POA: Diagnosis not present

## 2019-04-14 DIAGNOSIS — Z9181 History of falling: Secondary | ICD-10-CM | POA: Diagnosis not present

## 2019-04-14 DIAGNOSIS — Z792 Long term (current) use of antibiotics: Secondary | ICD-10-CM | POA: Diagnosis not present

## 2019-04-14 LAB — CBC W/ DIFFERENTIAL
BANDED NEUTROPHILS ABSOLUTE COUNT: 0 10*3/uL (ref 0.0–0.1)
BASOPHILS ABSOLUTE COUNT: 0.1 10*3/uL (ref 0.0–0.2)
BASOPHILS RELATIVE PERCENT: 0 %
EOSINOPHILS ABSOLUTE COUNT: 0.1 10*3/uL (ref 0.0–0.4)
EOSINOPHILS RELATIVE PERCENT: 1 %
HEMATOCRIT: 23.8 % — ABNORMAL LOW (ref 34.0–46.6)
HEMOGLOBIN: 8.4 g/dL — ABNORMAL LOW (ref 11.1–15.9)
IMMATURE GRANULOCYTES: 0 %
LYMPHOCYTES RELATIVE PERCENT: 0 %
MEAN CORPUSCULAR HEMOGLOBIN CONC: 35.3 g/dL (ref 31.5–35.7)
MEAN CORPUSCULAR HEMOGLOBIN: 33.7 pg — ABNORMAL HIGH (ref 26.6–33.0)
MEAN CORPUSCULAR VOLUME: 96 fL (ref 79–97)
MONOCYTES ABSOLUTE COUNT: 0.3 10*3/uL (ref 0.1–0.9)
NEUTROPHILS ABSOLUTE COUNT: 12.2 10*3/uL — ABNORMAL HIGH (ref 1.4–7.0)
NEUTROPHILS RELATIVE PERCENT: 96 %
PLATELET COUNT: 192 10*3/uL (ref 150–450)
RED CELL DISTRIBUTION WIDTH: 14 % (ref 11.7–15.4)
WHITE BLOOD CELL COUNT: 12.8 10*3/uL — ABNORMAL HIGH (ref 3.4–10.8)

## 2019-04-14 LAB — EOSINOPHILS RELATIVE PERCENT: Eosinophils/100 leukocytes:NFr:Pt:Bld:Qn:Automated count: 1

## 2019-04-14 LAB — VITAMIN D, TOTAL (25OH): Lab: 8.4 — ABNORMAL LOW

## 2019-04-14 LAB — CPRA%: Lab: 49

## 2019-04-14 LAB — FSAB CLASS 1 ANTIBODY SPECIFICITY: HLA CLASS 1 ANTIBODY RESULT: POSITIVE

## 2019-04-14 LAB — MAGNESIUM: Magnesium:MCnc:Pt:Ser/Plas:Qn:: 1.5 — ABNORMAL LOW

## 2019-04-14 MED ORDER — PREGABALIN 75 MG CAPSULE
ORAL_CAPSULE | Freq: Two times a day (BID) | ORAL | 3 refills | 30.00000 days | Status: CP
Start: 2019-04-14 — End: ?
  Filled 2019-05-13: qty 60, 30d supply, fill #0

## 2019-04-14 NOTE — Telephone Encounter (Signed)
Patient calling as FYI to let Dr. Terese Door know that she has received a kidney.

## 2019-04-15 ENCOUNTER — Telehealth: Payer: Self-pay | Admitting: Internal Medicine

## 2019-04-15 DIAGNOSIS — Z79899 Other long term (current) drug therapy: Secondary | ICD-10-CM | POA: Diagnosis not present

## 2019-04-15 DIAGNOSIS — Z940 Kidney transplant status: Secondary | ICD-10-CM | POA: Diagnosis not present

## 2019-04-15 LAB — TACROLIMUS BLOOD: Tacrolimus:MCnc:Pt:Bld:Qn:LC/MS/MS: 9.4

## 2019-04-15 NOTE — Telephone Encounter (Signed)
[place]  Caller/Agency: Yvetta Coder Number: [phone] Requesting OT/PT/Skilled Nursing/Social Work/Speech Therapy: Home health  Frequency:2x3 1x2

## 2019-04-15 NOTE — Telephone Encounter (Signed)
Yes I am glad she did   Thank you for the update  [place]

## 2019-04-16 LAB — CBC W/ DIFFERENTIAL
BANDED NEUTROPHILS ABSOLUTE COUNT: 0 10*3/uL (ref 0.0–0.1)
BASOPHILS ABSOLUTE COUNT: 0 10*3/uL (ref 0.0–0.2)
EOSINOPHILS ABSOLUTE COUNT: 0.1 10*3/uL (ref 0.0–0.4)
EOSINOPHILS RELATIVE PERCENT: 1 %
HEMATOCRIT: 25.2 % — ABNORMAL LOW (ref 34.0–46.6)
HEMOGLOBIN: 8.4 g/dL — ABNORMAL LOW (ref 11.1–15.9)
IMMATURE GRANULOCYTES: 0 %
LYMPHOCYTES ABSOLUTE COUNT: 0.1 10*3/uL — ABNORMAL LOW (ref 0.7–3.1)
LYMPHOCYTES RELATIVE PERCENT: 1 %
MEAN CORPUSCULAR HEMOGLOBIN: 33.5 pg — ABNORMAL HIGH (ref 26.6–33.0)
MEAN CORPUSCULAR VOLUME: 100 fL — ABNORMAL HIGH (ref 79–97)
MONOCYTES ABSOLUTE COUNT: 0.3 10*3/uL (ref 0.1–0.9)
MONOCYTES RELATIVE PERCENT: 3 %
NEUTROPHILS ABSOLUTE COUNT: 8.7 10*3/uL — ABNORMAL HIGH (ref 1.4–7.0)
NEUTROPHILS RELATIVE PERCENT: 95 %
PLATELET COUNT: 198 10*3/uL (ref 150–450)
RED BLOOD CELL COUNT: 2.51 x10E6/uL — CL (ref 3.77–5.28)
RED CELL DISTRIBUTION WIDTH: 15.3 % (ref 11.7–15.4)
WHITE BLOOD CELL COUNT: 9.2 10*3/uL (ref 3.4–10.8)

## 2019-04-16 LAB — TACROLIMUS BLOOD: Tacrolimus:MCnc:Pt:Bld:Qn:LC/MS/MS: 16.2

## 2019-04-16 LAB — MAGNESIUM
MAGNESIUM: 1.3 mg/dL — ABNORMAL LOW (ref 1.6–2.3)
Magnesium:MCnc:Pt:Ser/Plas:Qn:: 1.3 — ABNORMAL LOW

## 2019-04-16 LAB — HEMOGLOBIN: Hemoglobin:MCnc:Pt:Bld:Qn:: 8.4 — ABNORMAL LOW

## 2019-04-18 DIAGNOSIS — Z940 Kidney transplant status: Principal | ICD-10-CM

## 2019-04-18 DIAGNOSIS — Z79899 Other long term (current) drug therapy: Principal | ICD-10-CM

## 2019-04-18 DIAGNOSIS — Z0000 Encounter for general adult medical examination without abnormal findings: Principal | ICD-10-CM

## 2019-04-18 DIAGNOSIS — Z1159 Encounter for screening for other viral diseases: Principal | ICD-10-CM

## 2019-04-18 LAB — EOSINOPHILS ABSOLUTE COUNT: Eosinophils:NCnc:Pt:Bld:Qn:Automated count: 0.1

## 2019-04-18 LAB — CBC W/ DIFFERENTIAL
BANDED NEUTROPHILS ABSOLUTE COUNT: 0 10*3/uL (ref 0.0–0.1)
BASOPHILS RELATIVE PERCENT: 1 %
EOSINOPHILS ABSOLUTE COUNT: 0.1 10*3/uL (ref 0.0–0.4)
EOSINOPHILS RELATIVE PERCENT: 2 %
HEMATOCRIT: 24.7 % — ABNORMAL LOW (ref 34.0–46.6)
HEMOGLOBIN: 8.5 g/dL — ABNORMAL LOW (ref 11.1–15.9)
LYMPHOCYTES RELATIVE PERCENT: 1 %
MEAN CORPUSCULAR HEMOGLOBIN: 33.7 pg — ABNORMAL HIGH (ref 26.6–33.0)
MEAN CORPUSCULAR VOLUME: 98 fL — ABNORMAL HIGH (ref 79–97)
MONOCYTES ABSOLUTE COUNT: 0.2 10*3/uL (ref 0.1–0.9)
MONOCYTES RELATIVE PERCENT: 4 %
NEUTROPHILS ABSOLUTE COUNT: 5.3 10*3/uL (ref 1.4–7.0)
NEUTROPHILS RELATIVE PERCENT: 92 %
PLATELET COUNT: 234 10*3/uL (ref 150–450)
RED BLOOD CELL COUNT: 2.52 x10E6/uL — CL (ref 3.77–5.28)
RED CELL DISTRIBUTION WIDTH: 15.2 % (ref 11.7–15.4)
WHITE BLOOD CELL COUNT: 5.7 10*3/uL (ref 3.4–10.8)

## 2019-04-18 MED ORDER — TACROLIMUS ER 1 MG TABLET,EXTENDED RELEASE 24 HR
ORAL_TABLET | 11 refills | 0 days | Status: CP
Start: 2019-04-18 — End: ?

## 2019-04-18 MED ORDER — TACROLIMUS ER 4 MG TABLET,EXTENDED RELEASE 24 HR
ORAL_TABLET | 11 refills | 0 days | Status: CP
Start: 2019-04-18 — End: ?

## 2019-04-18 NOTE — Telephone Encounter (Signed)
Called and left detailed voice message for Theresa [name] w/ [facility] and gave verbal orders for home health 2x3 1x2 as requested.

## 2019-04-18 NOTE — Unmapped (Signed)
Per Dr Gwynneth Munson if true trough go down on envarsus to 10mg  daily and hold a dose.    Left VM for patient and called her sister to go over dose change. Sister confimrs she held medications before labs and took at 9am the day before.    Sister Clotilde Dieter wrote down to hold Tuesday dose and Wednesday start 10mg  of envarsus.  She will change med sheet and adjust pills in pill box.  She also read back information.    Denies any other needs

## 2019-04-18 NOTE — Unmapped (Signed)
Envarsus dose decrease  Last filled 04/06/19  Copay = $0

## 2019-04-19 ENCOUNTER — Encounter: Admit: 2019-04-19 | Discharge: 2019-04-20 | Payer: MEDICARE

## 2019-04-19 DIAGNOSIS — Z79899 Other long term (current) drug therapy: Principal | ICD-10-CM

## 2019-04-19 DIAGNOSIS — Z940 Kidney transplant status: Principal | ICD-10-CM

## 2019-04-19 DIAGNOSIS — R1032 Left lower quadrant pain: Principal | ICD-10-CM

## 2019-04-19 DIAGNOSIS — N3289 Other specified disorders of bladder: Principal | ICD-10-CM

## 2019-04-19 DIAGNOSIS — D899 Disorder involving the immune mechanism, unspecified: Principal | ICD-10-CM

## 2019-04-19 DIAGNOSIS — Z0000 Encounter for general adult medical examination without abnormal findings: Principal | ICD-10-CM

## 2019-04-19 DIAGNOSIS — D849 Immunodeficiency, unspecified: Secondary | ICD-10-CM | POA: Diagnosis not present

## 2019-04-19 DIAGNOSIS — Z6826 Body mass index (BMI) 26.0-26.9, adult: Secondary | ICD-10-CM | POA: Diagnosis not present

## 2019-04-19 LAB — RENAL FUNCTION PANEL
ALBUMIN: 3.7 g/dL — ABNORMAL LOW (ref 3.8–4.8)
BUN / CREAT RATIO: 8 — ABNORMAL LOW (ref 12–28)
CHLORIDE: 108 mmol/L — ABNORMAL HIGH (ref 96–106)
CO2: 19 mmol/L — ABNORMAL LOW (ref 20–29)
CREATININE: 1.35 mg/dL — ABNORMAL HIGH (ref 0.57–1.00)
GFR MDRD AF AMER: 49 mL/min/{1.73_m2} — ABNORMAL LOW
GFR MDRD NON AF AMER: 42 mL/min/{1.73_m2} — ABNORMAL LOW
GLUCOSE: 128 mg/dL — ABNORMAL HIGH (ref 65–99)
PHOSPHORUS, SERUM: 3 mg/dL (ref 3.0–4.3)
POTASSIUM: 4.6 mmol/L (ref 3.5–5.2)
SODIUM: 140 mmol/L (ref 134–144)

## 2019-04-19 LAB — MAGNESIUM: Magnesium:MCnc:Pt:Ser/Plas:Qn:: 1.4 — ABNORMAL LOW

## 2019-04-19 LAB — GLUCOSE: Glucose:MCnc:Pt:Ser/Plas:Qn:: 128 — ABNORMAL HIGH

## 2019-04-19 LAB — TACROLIMUS BLOOD: Tacrolimus:MCnc:Pt:Bld:Qn:LC/MS/MS: 10.9

## 2019-04-19 MED ORDER — OXYBUTYNIN CHLORIDE 5 MG TABLET
ORAL_TABLET | Freq: Three times a day (TID) | ORAL | 0 refills | 14.00000 days | Status: CP
Start: 2019-04-19 — End: 2019-05-03

## 2019-04-19 NOTE — Unmapped (Signed)
Patient called and states she is having pain in her abdomen for the past 2 days.  States pain is more intense when she moves or sits up straight.  Taking medications as prescribed.    Denies drainage or redness from incision. No fever. No swelling.  BP 178/73.    Discussed with [facility] NP who states patient can come to clinic today to be seen.    Patient's sister to bring her to clinic now.

## 2019-04-19 NOTE — Unmapped (Signed)
Transplant Nephrology Clinic Visit      History of Present Illness    63 y.o. female here for follow up after kidney transplantation.  Patient has ESRD secondary to DM II.  Patient has been on dialysis since 02/07/2014.  Patient's history includes HTN, CAD, CHF, HLD, COPD, PVD, sickle cell trait, and chronic back pain.     Patient was admitted for kidney transplant on 03/28/2019.  She received 1 unit pRBC on 03/30/2019 and 04/03/2019.  Patient had delayed graft function after transplant, but no dialysis was needed. RUS on 03/30/2019 showed 1.8 cm hypoechoic fluid collection abutting the left lower pole of the transplant kidney likely representing postsurgical seroma.  Patient discharged on 04/06/2019 to her home.       Transplant History:    Organ Received: Left DDKT, DBD, SCD, KDPI: 70%  Resp culture: + strep pneumo  Native Kidney Disease: DM II  Date of Transplant: 03/30/2019  Post-Transplant Course: DGF  Prior Transplants: No  Induction: [place]  Date of Ureteral Stent Removal: 05/09/2019  Current Immunosuppression: Tacrolimus/Myfortic  CMV/EBV Status: CMV D-/R+  EBV D+/R+  Rejection Episodes: None  Donor Specific Antibodies: None  Results of Renal Imaging (pre and post):      Pre-Txp 07/07/17  -Kidneys are small with mildly increased echogenicity bilaterally. No solid masses or calculi in the visualized kidneys. No hydronephrosis    CT Abdomen/Pelvis w/o Contrast 03/29/2019 (pre-txp)  -1.2 cm hypodensity in the right kidney lower pole (2:48), increased in size from prior however incompletely characterized given lack of intravenous contrast.  -Uterus is surgically absent.    Post-Txp (txp kidney only) 03/30/2019  -Elevated resistive index in the main renal artery anastomosis, likely from postsurgical edema. Remaining resistive indices in the renal fossa are within normal limits. -1.8 cm hypoechoic fluid collection abutting the left lower pole of the transplant kidney likely representing postsurgical seroma. Additional follow-up.      Current Immunosuppression Regimen:   Envarsus 10 mg daily  Myfortic 540 mg BID      Subjective/Interval:   Patient called earlier today complaining of pain and was instructed to come to clinic to be seen.  She is complaining of sharp shooting pain at her LLQ that has gotten more frequent over the past 2 days.  Per patient pain does not radiate and goes away quickly after it occurs.   She does state that when she finishes going to the bathroom, once she stands she has to go again and then is able to completely empty her bladder. Patient still has her stent, which is scheduled to be removed on 05/09/19.  Patient held today's tacrolimus medication due to elevated trough level previously.     She is currently following with a cardiologist with [facility] and a Neurosurgeon at [facility] (appt on 04/26/19). All information in care everywhere    Patient denies headaches, lightheadedness, dizziness, chest pain, shortness of breath, n/v/d, fever/chills, edema.         Past Medical History  1. DM II  2. HTN  3. HLD  4. CAD  5. CHF  6. COPD  7. PVD  8. Sickle Cell Trait  9. DDD with chronic pain    Review of Systems      Otherwise as per HPI, all other systems reviewed and are negative.    Medications  Current Outpatient Medications   Medication Sig Dispense Refill   ??? acetaminophen (TYLENOL) 500 MG tablet Take 1-2 tablets (500-1,000 mg total) by mouth every six (  6) hours as needed for pain or fever (> 38C or 100.37F). 30 tablet 0   ??? albuterol (PROVENTIL HFA;VENTOLIN HFA) 90 mcg/actuation inhaler Inhale 2 puffs every six (6) hours as needed for wheezing.     ??? amLODIPine (NORVASC) 10 MG tablet Take 1 tablet (10 mg total) by mouth daily. 30 tablet 11   ??? aspirin (ECOTRIN) 81 MG tablet Take 81 mg by mouth daily. ??? atorvastatin (LIPITOR) 40 MG tablet Take 1 tablet (40 mg total) by mouth daily. 30 tablet 11   ??? blood sugar diagnostic (CONTOUR TEST STRIPS) Strp TEST BLOOD SUGARS 2 TIMES DAILY     ??? blood-glucose meter (ONETOUCH ULTRA SYSTEM KIT) kit Please dispense for patient to use at home to check blood sugars.     ??? carvediloL (COREG) 12.5 MG tablet Take 2 tablets (25 mg total) by mouth Two (2) times a day. 120 tablet 11   ??? cloNIDine HCL (CATAPRES) 0.1 MG tablet Take 2 tablets (0.2 mg total) by mouth every eight (8) hours. 180 tablet 11   ??? docusate sodium (COLACE) 100 MG capsule Take 1 capsule (100 mg total) by mouth two (2) times a day as needed for constipation. 60 capsule 0   ??? LANCETS,THIN MISC Using at home to test blood sugars twice daily.     ??? magnesium oxide-Mg AA chelate (MAGNESIUM, AMINO ACID CHELATE,) 133 mg Tab Take 1 tablet by mouth Two (2) times a day. HOLD until directed to start by your coordinator. (Patient not taking: Reported on 04/11/2019) 100 tablet 11   ??? mycophenolate (MYFORTIC) 180 MG EC tablet Take 3 tablets (540 mg total) by mouth Two (2) times a day. 180 tablet 11   ??? nitroglycerin (NITROSTAT) 0.4 MG SL tablet Place 0.4 mg under the tongue daily as needed.     ??? oxyCODONE (ROXICODONE) 5 MG immediate release tablet Take 1-2 tablets (5-10 mg total) by mouth every six (6) hours as needed for pain. 40 tablet 0   ??? pantoprazole (PROTONIX) 40 MG tablet Take 1 tablet (40 mg total) by mouth daily. 30 tablet 11   ??? polyethylene glycol (GLYCOLAX) 17 gram/dose powder Mix 1 capful (17 g) in 4 to 8 ounces water, juice, soda, coffee, or tea and drink daily as needed. 510 g 0   ??? pregabalin (LYRICA) 75 MG capsule Take 1 capsule (75 mg total) by mouth Two (2) times a day. 60 capsule 3   ??? senna (SENOKOT) 8.6 mg tablet Take 1 tablet by mouth nightly as needed for constipation. 30 tablet 11 ??? sulfamethoxazole-trimethoprim (BACTRIM) 400-80 mg per tablet Take 1 tablet (80 mg of trimethoprim total) by mouth Every Monday, Wednesday, and Friday. 12 tablet 5   ??? tacrolimus (ENVARSUS XR) 1 mg Tb24 extended release tablet Take 2 (1mg ) tablets with 2 (4mg ) tablets by mouth daily. Total daily dose = 10mg . 60 tablet 11   ??? tacrolimus (ENVARSUS XR) 4 mg Tb24 extended release tablet Take 2 (4mg ) tablets with 2 (1mg ) tablets by mouth daily. Total daily dose = 10mg . 60 tablet 11   ??? valGANciclovir (VALCYTE) 450 mg tablet Take 1 tablet (450 mg total) by mouth daily. 30 tablet 2     No current facility-administered medications for this visit.            Physical Exam  BP 168/67  - Pulse 75  - Temp 36.5 ??C (97.7 ??F) (Tympanic)  - Ht 170.2 cm (5' 7)  - Wt 76.1 kg (167 lb 12.8 oz)  -  BMI 26.28 kg/m??   General: no acute distress  HEENT: wearing mask PERRL  Neck: neck supple, no cervical lymphadenopathy appreciated  CV: normal rate, normal rhythm, no murmur, no gallops, no rubs appreciated  Lungs: clear to auscultation bilaterally  Abdomen: soft, tender over graft site;  Staples present, c/d/i; no drainage or odor noted; no erythema;   Extremities: no edema  Musculoskeletal: no visible deformity, normal range of motion.  Pulses: intact distally throughout  Neurologic: awake, alert, and oriented x3      Laboratory Data and Imaging reviewed in EPIC      Assessment:  63 y.o. female status post deceased donor kidney transplant on 03/30/2019 for ESRD secondary to DM II who presents for routine follow up and post-transplant care.         Recommendations/Plan:     Labs drawn 04/18/2019.      Allograft Function: Renal function stable at 1.35 after continuing to decrease since transplant.   UPC normal Immunosuppression Management [High Risk Medical Decision Making For Drug Therapy Requiring Intensive Monitoring For Toxicity]: Tacrolimus trough level 10.9. Tacrolimus 10 mg daily.  Targeting tacrolimus trough levels of approximately 8-10 ng/mL.   Continue mycophenolate 540 mg BID.     Blood Pressure Management: BP 168/67 at this visit. Continue current regimen.  Will continue to monitor BP in relation to pain.      Lipid Management: Last lipid panel on 07/07/2017.  Continue atorvastatin.      Electrolytes: Electrolyte and metabolic parameters in acceptable range.  Will continue to monitor     Infectious Prophylaxis and Monitoring: CMV VL not drawn.  BK VL not detected.  Will continue to monitor.   The patient continues on Valcyte and Bactrim prophylaxis.     Pulmonary Nodule: Seen on NM Stress 07/07/17.  Subcentimeter nodule is stable from previous study 03/18/16 and 03/07/14    Lumbar Radiculopathy: Following with [facility] with next appt 04/26/2019 with MRI of spine.  Chronic low back pain with numbness to BLE at time.  Sometimes Right foot drags as well.  Plan is for surgery in the future for Lumbosacral DDD.     Right Breast Soft Tissue Mass: no size change since 03/18/2016.  Needs an updated mammogram.    Neuropathy: Lyrica increased to BID to assist with pain at graft site.     LLQ Pain: RUS scheduled for tomorrow.  Prescribed Ditropan as patient may be having bladder spasms.  Unable to check urine today as patient already went to restroom.  Will get UA/UC tomorrow with labs.     Health Maintenance:   Mammo: 2018  Pap Smear: Hysterectomy  Colonoscopy: 08/20/2017; repeat 5 years due to multiple tubular adenomas      Immunizations:   Flu Shot: when 30 days post transplant  Prevnar 13: not noted in records from 2012 and after.  Will get after 1 year post transplant  Pneumovax: 03/23/18        Counseling:  I counseled the patient on: The need to avoid sun exposure and the use of sunblock while outdoors given the relatively higher risk of skin malignancy in an immunosuppressed state.  The need for adherence to immunosuppression medication.  Patient verbalized understanding.     Follow-Up:  Return to clinic in 1 week on 03/28/19  Labs: 3 times a week    Patient will continue to follow-up with his primary care provider for non-transplant related issues and medication refills. We have ordered transplant specific labs per the center's  guidelines to monitor and assess for toxicities from immunosuppressant drug therapy         Kaleiah Kutzer L. Marina Goodell, MSN, APRN, AGNP-C - Kidney Transplant Nurse Practitioner  [facility]

## 2019-04-20 ENCOUNTER — Encounter: Admit: 2019-04-20 | Discharge: 2019-04-21 | Payer: MEDICARE

## 2019-04-20 DIAGNOSIS — Z940 Kidney transplant status: Secondary | ICD-10-CM | POA: Diagnosis not present

## 2019-04-20 DIAGNOSIS — N271 Small kidney, bilateral: Secondary | ICD-10-CM | POA: Diagnosis not present

## 2019-04-20 LAB — CBC W/ DIFFERENTIAL
BANDED NEUTROPHILS ABSOLUTE COUNT: 0 10*3/uL (ref 0.0–0.1)
BASOPHILS RELATIVE PERCENT: 1 %
EOSINOPHILS ABSOLUTE COUNT: 0.1 10*3/uL (ref 0.0–0.4)
EOSINOPHILS RELATIVE PERCENT: 1 %
HEMATOCRIT: 25.3 % — ABNORMAL LOW (ref 34.0–46.6)
HEMOGLOBIN: 8.5 g/dL — ABNORMAL LOW (ref 11.1–15.9)
LYMPHOCYTES ABSOLUTE COUNT: 0.1 10*3/uL — ABNORMAL LOW (ref 0.7–3.1)
LYMPHOCYTES RELATIVE PERCENT: 1 %
MEAN CORPUSCULAR HEMOGLOBIN CONC: 33.6 g/dL (ref 31.5–35.7)
MEAN CORPUSCULAR HEMOGLOBIN: 32.1 pg (ref 26.6–33.0)
MEAN CORPUSCULAR VOLUME: 96 fL (ref 79–97)
MONOCYTES ABSOLUTE COUNT: 0.2 10*3/uL (ref 0.1–0.9)
MONOCYTES RELATIVE PERCENT: 3 %
NEUTROPHILS ABSOLUTE COUNT: 5.9 10*3/uL (ref 1.4–7.0)
NEUTROPHILS RELATIVE PERCENT: 93 %
PLATELET COUNT: 288 10*3/uL (ref 150–450)
RED BLOOD CELL COUNT: 2.65 x10E6/uL — CL (ref 3.77–5.28)
RED CELL DISTRIBUTION WIDTH: 14.6 % (ref 11.7–15.4)
WHITE BLOOD CELL COUNT: 6.3 10*3/uL (ref 3.4–10.8)

## 2019-04-20 LAB — MONOCYTES ABSOLUTE COUNT: Monocytes:NCnc:Pt:Bld:Qn:Automated count: 0.2

## 2019-04-20 NOTE — Unmapped (Signed)
[facility] orders for urine

## 2019-04-21 DIAGNOSIS — Z79899 Other long term (current) drug therapy: Principal | ICD-10-CM

## 2019-04-21 DIAGNOSIS — Z940 Kidney transplant status: Principal | ICD-10-CM

## 2019-04-21 LAB — RENAL FUNCTION PANEL
ALBUMIN: 3.7 g/dL — ABNORMAL LOW (ref 3.8–4.8)
BLOOD UREA NITROGEN: 11 mg/dL (ref 8–27)
BUN / CREAT RATIO: 9 — ABNORMAL LOW (ref 12–28)
CALCIUM: 10.3 mg/dL (ref 8.7–10.3)
CHLORIDE: 108 mmol/L — ABNORMAL HIGH (ref 96–106)
CO2: 17 mmol/L — ABNORMAL LOW (ref 20–29)
CREATININE: 1.21 mg/dL — ABNORMAL HIGH (ref 0.57–1.00)
GFR MDRD AF AMER: 56 mL/min/{1.73_m2} — ABNORMAL LOW
GFR MDRD NON AF AMER: 48 mL/min/{1.73_m2} — ABNORMAL LOW
GLUCOSE: 138 mg/dL — ABNORMAL HIGH (ref 65–99)
PHOSPHORUS, SERUM: 3.4 mg/dL (ref 3.0–4.3)
POTASSIUM: 4.8 mmol/L (ref 3.5–5.2)

## 2019-04-21 LAB — URINALYSIS
GLUCOSE UA: NEGATIVE
KETONES UA: NEGATIVE
NITRITE UA: NEGATIVE
PH UA: 6 (ref 5.0–7.5)
PROTEIN UA: NEGATIVE
SPECIFIC GRAVITY UA: 1.009 (ref 1.005–1.030)
UROBILINOGEN UA: 0.2 mg/dL (ref 0.2–1.0)

## 2019-04-21 LAB — MICROSCOPIC EXAMINATION: BACTERIA: NONE SEEN

## 2019-04-21 LAB — BACTERIA: Bacteria:Naric:Pt:Urine sed:Qn:Microscopy.light.HPF: NONE SEEN

## 2019-04-21 LAB — UA, MICROSCOPIC

## 2019-04-21 LAB — MAGNESIUM: Magnesium:MCnc:Pt:Ser/Plas:Qn:: 1.4 — ABNORMAL LOW

## 2019-04-21 LAB — ALBUMIN: Albumin:MCnc:Pt:Ser/Plas:Qn:: 3.7 — ABNORMAL LOW

## 2019-04-21 NOTE — Unmapped (Signed)
Called to check in on patient.  Feels a little better today.     Let her know I will call if she needs antibiotics as we are waiting for UC.    Denies any other needs

## 2019-04-22 DIAGNOSIS — Z940 Kidney transplant status: Secondary | ICD-10-CM | POA: Diagnosis not present

## 2019-04-22 DIAGNOSIS — Z79899 Other long term (current) drug therapy: Secondary | ICD-10-CM | POA: Diagnosis not present

## 2019-04-22 LAB — TACROLIMUS BLOOD: Tacrolimus:MCnc:Pt:Bld:Qn:LC/MS/MS: 3.4

## 2019-04-22 NOTE — Unmapped (Signed)
Called and spoke to [facility] (sister) to see if they could come Monday to see Dr Doyne Keel and HP and not later in the week.  She states that is fine.  She will come around 930 and have labs and then come to [facility].    Reports patient barely had any spasms yesterday and is feeling much better. Denies any needs

## 2019-04-23 LAB — CBC W/ DIFFERENTIAL
BANDED NEUTROPHILS ABSOLUTE COUNT: 0 10*3/uL (ref 0.0–0.1)
BASOPHILS ABSOLUTE COUNT: 0 10*3/uL (ref 0.0–0.2)
BASOPHILS RELATIVE PERCENT: 1 %
EOSINOPHILS ABSOLUTE COUNT: 0.1 10*3/uL (ref 0.0–0.4)
HEMATOCRIT: 25.4 % — ABNORMAL LOW (ref 34.0–46.6)
HEMOGLOBIN: 8.7 g/dL — ABNORMAL LOW (ref 11.1–15.9)
IMMATURE GRANULOCYTES: 0 %
LYMPHOCYTES ABSOLUTE COUNT: 0.1 10*3/uL — ABNORMAL LOW (ref 0.7–3.1)
LYMPHOCYTES RELATIVE PERCENT: 1 %
MEAN CORPUSCULAR HEMOGLOBIN CONC: 34.3 g/dL (ref 31.5–35.7)
MEAN CORPUSCULAR HEMOGLOBIN: 32.8 pg (ref 26.6–33.0)
MEAN CORPUSCULAR VOLUME: 96 fL (ref 79–97)
MONOCYTES ABSOLUTE COUNT: 0.2 10*3/uL (ref 0.1–0.9)
MONOCYTES RELATIVE PERCENT: 3 %
NEUTROPHILS ABSOLUTE COUNT: 4.9 10*3/uL (ref 1.4–7.0)
PLATELET COUNT: 296 10*3/uL (ref 150–450)
RED BLOOD CELL COUNT: 2.65 x10E6/uL — CL (ref 3.77–5.28)
RED CELL DISTRIBUTION WIDTH: 14.9 % (ref 11.7–15.4)
WHITE BLOOD CELL COUNT: 5.3 10*3/uL (ref 3.4–10.8)

## 2019-04-23 LAB — RENAL FUNCTION PANEL
ALBUMIN: 3.8 g/dL (ref 3.8–4.8)
BLOOD UREA NITROGEN: 12 mg/dL (ref 8–27)
BUN / CREAT RATIO: 9 — ABNORMAL LOW (ref 12–28)
CHLORIDE: 106 mmol/L (ref 96–106)
CO2: 18 mmol/L — ABNORMAL LOW (ref 20–29)
GFR MDRD AF AMER: 52 mL/min/{1.73_m2} — ABNORMAL LOW
GFR MDRD NON AF AMER: 45 mL/min/{1.73_m2} — ABNORMAL LOW
GLUCOSE: 127 mg/dL — ABNORMAL HIGH (ref 65–99)
PHOSPHORUS, SERUM: 3 mg/dL (ref 3.0–4.3)
POTASSIUM: 5 mmol/L (ref 3.5–5.2)

## 2019-04-23 LAB — MAGNESIUM: Magnesium:MCnc:Pt:Ser/Plas:Qn:: 1.5 — ABNORMAL LOW

## 2019-04-23 LAB — TACROLIMUS BLOOD: Tacrolimus:MCnc:Pt:Bld:Qn:LC/MS/MS: 5.7

## 2019-04-23 LAB — EOSINOPHILS RELATIVE PERCENT: Eosinophils/100 leukocytes:NFr:Pt:Bld:Qn:Automated count: 3

## 2019-04-23 LAB — POTASSIUM: Potassium:SCnc:Pt:Ser/Plas:Qn:: 5

## 2019-04-25 ENCOUNTER — Ambulatory Visit: Admit: 2019-04-25 | Discharge: 2019-04-26 | Payer: MEDICARE

## 2019-04-25 ENCOUNTER — Encounter: Admit: 2019-04-25 | Discharge: 2019-04-26 | Payer: MEDICARE

## 2019-04-25 ENCOUNTER — Encounter
Admit: 2019-04-25 | Discharge: 2019-04-26 | Payer: MEDICARE | Attending: Student in an Organized Health Care Education/Training Program | Primary: Student in an Organized Health Care Education/Training Program

## 2019-04-25 DIAGNOSIS — Z940 Kidney transplant status: Principal | ICD-10-CM

## 2019-04-25 DIAGNOSIS — Z79899 Other long term (current) drug therapy: Principal | ICD-10-CM

## 2019-04-25 DIAGNOSIS — R93429 Abnormal radiologic findings on diagnostic imaging of unspecified kidney: Secondary | ICD-10-CM | POA: Diagnosis not present

## 2019-04-25 DIAGNOSIS — Z6825 Body mass index (BMI) 25.0-25.9, adult: Secondary | ICD-10-CM | POA: Diagnosis not present

## 2019-04-25 DIAGNOSIS — Z4822 Encounter for aftercare following kidney transplant: Secondary | ICD-10-CM | POA: Diagnosis not present

## 2019-04-25 DIAGNOSIS — D849 Immunodeficiency, unspecified: Secondary | ICD-10-CM | POA: Diagnosis not present

## 2019-04-25 DIAGNOSIS — Z1159 Encounter for screening for other viral diseases: Secondary | ICD-10-CM | POA: Diagnosis not present

## 2019-04-25 LAB — CBC W/ AUTO DIFF
BASOPHILS ABSOLUTE COUNT: 0 10*9/L (ref 0.0–0.1)
BASOPHILS RELATIVE PERCENT: 0.5 %
EOSINOPHILS ABSOLUTE COUNT: 0.2 10*9/L (ref 0.0–0.4)
EOSINOPHILS RELATIVE PERCENT: 2 %
HEMOGLOBIN: 10 g/dL — ABNORMAL LOW (ref 12.0–16.0)
LYMPHOCYTES ABSOLUTE COUNT: 0.2 10*9/L — ABNORMAL LOW (ref 1.5–5.0)
LYMPHOCYTES RELATIVE PERCENT: 2 %
MEAN CORPUSCULAR HEMOGLOBIN CONC: 31.8 g/dL (ref 31.0–37.0)
MEAN CORPUSCULAR HEMOGLOBIN: 32.5 pg (ref 26.0–34.0)
MEAN CORPUSCULAR VOLUME: 102.2 fL — ABNORMAL HIGH (ref 80.0–100.0)
MEAN PLATELET VOLUME: 9.6 fL (ref 7.0–10.0)
MONOCYTES ABSOLUTE COUNT: 0.2 10*9/L (ref 0.2–0.8)
MONOCYTES RELATIVE PERCENT: 2.4 %
NEUTROPHILS ABSOLUTE COUNT: 7 10*9/L (ref 2.0–7.5)
NEUTROPHILS RELATIVE PERCENT: 92.9 %
PLATELET COUNT: 314 10*9/L (ref 150–440)
RED BLOOD CELL COUNT: 3.09 10*12/L — ABNORMAL LOW (ref 4.00–5.20)
RED CELL DISTRIBUTION WIDTH: 17.6 % — ABNORMAL HIGH (ref 12.0–15.0)
WBC ADJUSTED: 7.5 10*9/L (ref 4.5–11.0)

## 2019-04-25 LAB — RENAL FUNCTION PANEL
ALBUMIN: 4.1 g/dL (ref 3.5–5.0)
ANION GAP: 9 mmol/L (ref 7–15)
BLOOD UREA NITROGEN: 11 mg/dL (ref 7–21)
BUN / CREAT RATIO: 10
CALCIUM: 10.7 mg/dL — ABNORMAL HIGH (ref 8.5–10.2)
CHLORIDE: 105 mmol/L (ref 98–107)
CO2: 23 mmol/L (ref 22.0–30.0)
CREATININE: 1.15 mg/dL — ABNORMAL HIGH (ref 0.60–1.00)
EGFR CKD-EPI AA FEMALE: 59 mL/min/{1.73_m2} — ABNORMAL LOW (ref >=60)
EGFR CKD-EPI NON-AA FEMALE: 51 mL/min/{1.73_m2} — ABNORMAL LOW (ref >=60)
PHOSPHORUS: 3.7 mg/dL (ref 2.9–4.7)
POTASSIUM: 4.8 mmol/L (ref 3.5–5.0)
SODIUM: 137 mmol/L (ref 135–145)

## 2019-04-25 LAB — URINALYSIS
BILIRUBIN UA: NEGATIVE
GLUCOSE UA: NEGATIVE
NITRITE UA: NEGATIVE
PH UA: 7 (ref 5.0–9.0)
PROTEIN UA: NEGATIVE
RBC UA: 9 /HPF — ABNORMAL HIGH (ref <=4)
SPECIFIC GRAVITY UA: 1.011 (ref 1.003–1.030)
UROBILINOGEN UA: 0.2
WBC UA: 7 /HPF — ABNORMAL HIGH (ref 0–5)

## 2019-04-25 LAB — CLARITY

## 2019-04-25 LAB — EGFR CKD-EPI NON-AA FEMALE: Lab: 51 — ABNORMAL LOW

## 2019-04-25 LAB — PROTEIN / CREATININE RATIO, URINE: CREATININE, URINE: 106 mg/dL

## 2019-04-25 LAB — SMEAR REVIEW

## 2019-04-25 LAB — PROTEIN URINE: Protein:MCnc:Pt:Urine:Qn:: 17.5

## 2019-04-25 LAB — HEMATOCRIT: Hematocrit:VFr:Pt:Bld:Qn:: 31.5 — ABNORMAL LOW

## 2019-04-25 LAB — MAGNESIUM: Magnesium:MCnc:Pt:Ser/Plas:Qn:: 1.4 — ABNORMAL LOW

## 2019-04-25 LAB — HEPATITIS C ANTIBODY

## 2019-04-25 LAB — TACROLIMUS BLOOD: Lab: 5.1

## 2019-04-25 MED ORDER — PANTOPRAZOLE 40 MG TABLET,DELAYED RELEASE
ORAL_TABLET | Freq: Two times a day (BID) | ORAL | 11 refills | 30.00000 days | Status: CP
Start: 2019-04-25 — End: 2020-04-24
  Filled 2019-04-26: qty 60, 30d supply, fill #0

## 2019-04-25 MED ORDER — TACROLIMUS ER 1 MG TABLET,EXTENDED RELEASE 24 HR
ORAL_TABLET | 11 refills | 0 days | Status: CP
Start: 2019-04-25 — End: ?

## 2019-04-25 MED ORDER — METFORMIN ER 500 MG TABLET,EXTENDED RELEASE 24 HR
ORAL_TABLET | Freq: Every day | ORAL | 11 refills | 30.00000 days | Status: CP
Start: 2019-04-25 — End: 2020-04-24
  Filled 2019-04-26: qty 30, 30d supply, fill #0

## 2019-04-25 MED ORDER — TACROLIMUS ER 4 MG TABLET,EXTENDED RELEASE 24 HR
ORAL_TABLET | 11 refills | 0 days | Status: CP
Start: 2019-04-25 — End: ?

## 2019-04-25 MED ORDER — CONTOUR TEST STRIPS
ORAL_STRIP | Freq: Two times a day (BID) | 5 refills | 0.00000 days | Status: CP
Start: 2019-04-25 — End: ?

## 2019-04-25 MED ORDER — CARVEDILOL 12.5 MG TABLET
ORAL_TABLET | Freq: Two times a day (BID) | ORAL | 11 refills | 30 days | Status: CP
Start: 2019-04-25 — End: 2020-04-24
  Filled 2019-04-26: qty 180, 30d supply, fill #0

## 2019-04-25 NOTE — Unmapped (Signed)
Called pt to make her aware of rescheduled RUS for this Thursday 11/19 at 10:00 at the main hospital.

## 2019-04-25 NOTE — Unmapped (Signed)
Assessment    Met w/ patient in [facility] today alongside Pharm and NP Marina Goodell. Reviewed meds/symptoms.Any new medications?                Pt reports no fever/cold/flu symptoms    BP: 155/64 today/ Home BP reported 150-180's SBP - Coreg inc to 37.5 bid. Will check in w/ her for BP log on Friday.   BG: 120-250  Admits to drinking small amounts of Mtn Dew   Denies Dizziness/Lightheaded, but does report intermittent HA   C/O significant hand tremors: unable to hold a water bottle to drink with one hand or use a fork. Poss change off Envarsus in 2 weeks if no improvement.   No change in nerve pain; endorses numbness/tingling not improved.   Denies fevers/chills/sweats: remains cold most days.    Denies CP/SOB/palpatations   Denies nausea/vomiting; does endorse heartburn. Pharm will increase Protonix to BID   Reports diarrhea w/ certain foods. Advised to change colace to PRN   UTI symptoms (burn/pain/itch/frequency/urgency/odor/color/foam): none   No visible or palpable edema    Appetite low; reports low-adequate hydration; approx 4 bottles/day. C/o significant dry mouth.      Incision: c/d/i - staples will remain per MD Zendel   LLQ pain improved w/ Ditropan, but remains sharply tender w/ palpation   Drain: n/a   Foley: n/a    Last Envarsus taken 0900; held for this morning's labs.    No other complaints or concerns.     Pt Follow up w/ Pt to see Neurosurgery at [facility]. Has appt tomorrow that she will switch to virtual if possible    Lab frequency changed to 2x/week (M/Th)    Needs repeat US this week per MD Zendel - Scheduled for Thursday at 10am at the main hospital

## 2019-04-25 NOTE — Unmapped (Signed)
Urine collected and sen to the lab

## 2019-04-25 NOTE — Unmapped (Signed)
[facility] PHARMACY NOTE  04/25/2019   Michele Cisneros  [id number]    Medication changes today:   1. Start metformin XR to 500 mg daily  2. Increase pantoprazole to 40 mg BID  3. Increase carvedilol to 37.5 mg BID  4. Increase Envarsus to 6 mg daily    Education/Adherence tools provided today:  1.provided updated medication list  2. provided additional pill box education  3.  provided additional education on immunosuppression and transplant related medications including reviewing indications of medications, dosing and side effects    Follow up items:  1. goal of understanding indications and dosing of immunosuppression medications  2. BP  3. BG  4. Consider transitioning to belatacept vs cyclosporine at next visit    Next visit with pharmacy in 1-2 weeks  ____________________________________________________________________    Michele Cisneros is a 63 y.o. female s/p deceased kidney transplant on 21-Apr-2019 (Kidney) 2/2 T2DM.     Other PMH significant for hypertension, T2DM, CAD s/p PCI     Seen by pharmacy today for: medication management and pill box fill and adherence education; last seen by pharmacy 2 weeks ago     CC:  Patient complains of significant tremors    Vitals:    04/25/19 1132   BP: 155/64   Pulse: 77   Temp: 36.2 ??C (97.1 ??F)   SpO2: 100%       Allergies   Allergen Reactions   ??? Hydrocodone Hives   ??? Lisinopril      Unknown reaction        All medications reviewed and updated.     Medication list includes revisions made during today???s encounter    Outpatient Encounter Medications as of 04/25/2019   Medication Sig Dispense Refill   ??? acetaminophen (TYLENOL) 500 MG tablet Take 1-2 tablets (500-1,000 mg total) by mouth every six (6) hours as needed for pain or fever (> 38C or 100.7F). 30 tablet 0   ??? albuterol (PROVENTIL HFA;VENTOLIN HFA) 90 mcg/actuation inhaler Inhale 2 puffs every six (6) hours as needed for wheezing. ??? amLODIPine (NORVASC) 10 MG tablet Take 1 tablet (10 mg total) by mouth daily. 30 tablet 11   ??? aspirin (ECOTRIN) 81 MG tablet Take 81 mg by mouth daily.     ??? atorvastatin (LIPITOR) 40 MG tablet Take 1 tablet (40 mg total) by mouth daily. 30 tablet 11   ??? blood-glucose meter (ONETOUCH ULTRA SYSTEM KIT) kit Please dispense for patient to use at home to check blood sugars.     ??? cloNIDine HCL (CATAPRES) 0.1 MG tablet Take 2 tablets (0.2 mg total) by mouth every eight (8) hours. 180 tablet 11   ??? docusate sodium (COLACE) 100 MG capsule Take 1 capsule (100 mg total) by mouth two (2) times a day as needed for constipation. 60 capsule 0   ??? LANCETS,THIN MISC Using at home to test blood sugars twice daily.     ??? magnesium oxide-Mg AA chelate (MAGNESIUM, AMINO ACID CHELATE,) 133 mg Tab Take 1 tablet by mouth Two (2) times a day. HOLD until directed to start by your coordinator. 100 tablet 11   ??? metFORMIN (GLUCOPHAGE XR) 500 MG 24 hr tablet Take 1 tablet (500 mg total) by mouth daily. 30 tablet 11   ??? mycophenolate (MYFORTIC) 180 MG EC tablet Take 3 tablets (540 mg total) by mouth Two (2) times a day. 180 tablet 11   ??? nitroglycerin (NITROSTAT) 0.4 MG SL tablet Place 0.4 mg  under the tongue daily as needed.     ??? oxybutynin (DITROPAN) 5 MG tablet Take 1 tablet (5 mg total) by mouth Three (3) times a day for 14 days. 42 tablet 0   ??? oxyCODONE (ROXICODONE) 5 MG immediate release tablet Take 1-2 tablets (5-10 mg total) by mouth every six (6) hours as needed for pain. 40 tablet 0   ??? polyethylene glycol (GLYCOLAX) 17 gram/dose powder Mix 1 capful (17 g) in 4 to 8 ounces water, juice, soda, coffee, or tea and drink daily as needed. 510 g 0   ??? pregabalin (LYRICA) 75 MG capsule Take 1 capsule (75 mg total) by mouth Two (2) times a day. 60 capsule 3   ??? senna (SENOKOT) 8.6 mg tablet Take 1 tablet by mouth nightly as needed for constipation. 30 tablet 11 ??? sulfamethoxazole-trimethoprim (BACTRIM) 400-80 mg per tablet Take 1 tablet (80 mg of trimethoprim total) by mouth Every Monday, Wednesday, and Friday. 12 tablet 5   ??? tacrolimus (ENVARSUS XR) 1 mg Tb24 extended release tablet Take 2 (1mg ) tablets with 2 (4mg ) tablets by mouth daily. Total daily dose = 10mg . 60 tablet 11   ??? tacrolimus (ENVARSUS XR) 4 mg Tb24 extended release tablet Take 2 (4mg ) tablets with 2 (1mg ) tablets by mouth daily. Total daily dose = 10mg . 60 tablet 11   ??? valGANciclovir (VALCYTE) 450 mg tablet Take 1 tablet (450 mg total) by mouth daily. 30 tablet 2   ??? [DISCONTINUED] blood sugar diagnostic (CONTOUR TEST STRIPS) Strp TEST BLOOD SUGARS 2 TIMES DAILY     ??? [DISCONTINUED] carvediloL (COREG) 12.5 MG tablet Take 2 tablets (25 mg total) by mouth Two (2) times a day. 120 tablet 11   ??? [DISCONTINUED] pantoprazole (PROTONIX) 40 MG tablet Take 1 tablet (40 mg total) by mouth daily. 30 tablet 11     No facility-administered encounter medications on file as of 04/25/2019.        Induction agent : alemtuzumab    CURRENT IMMUNOSUPPRESSION: Envarsus 10 mg PO qd  prograf/Envarsus/cyclosporine goal: 8-10   [id number]  mg PO bid    steroid free     Patient complains of tremor (significant), neuropathy     IMMUNOSUPPRESSION DRUG LEVELS:  Lab Results   Component Value Date    Tacrolimus, Trough 3.6 (L) 04/11/2019    Tacrolimus, Trough 3.5 (L) 04/06/2019    Tacrolimus, Trough 5.9 04/05/2019    Tacrolimus Lvl 5.7 04/22/2019    Tacrolimus Lvl 3.4 04/20/2019    Tacrolimus Lvl 10.9 04/18/2019     No results found for: CYCLO  No results found for: EVEROLIMUS  No results found for: SIROLIMUS    Envarsus level is accurate 24 hour trough    Graft function: stable  DSA: ntd  Biopsies to date: zero hour biopsy - mild acute tubular injury  UPC: 0.165 on 04/25/19  WBC/ANC:  wnl Plan: Will increase Envarsus to 6 mg daily. Repeat kidney ultrasound later this week given increased resistive indices.  If tremor remains this significant at next visit will consider transitioning to cyclosporine vs belatacept. Continue to monitor.    OI Prophylaxis:   CMV Status: D-/ R+, moderate risk. CMV prophylaxis: valganciclovir 450 mg daily x 3 months per protocol.  Estimated Creatinine Clearance: 54.1 mL/min (A) (based on SCr of 1.15 mg/dL (H)).  No results found for: CMVCP  PCP Prophylaxis: bactrim SS 1 tab MWF x 6 months.  Thrush: completed in hospital  Patient is  tolerating infectious prophylaxis well  Plan: Continue to monitor.    CV Prophylaxis: asa 81 mg   The ASCVD Risk score Denman George DC Montez Hageman, et al., 2013) failed to calculate.  Statin therapy: Indicated given DM+ CAD history; atorvastatin 40 mg daily  Plan: Continue to monitor     BP: Goal < 140/90. Clinic vitals reported above  Home BP ranges: 140-160/70-80  Current meds include: amlodipine 10 mg, carvedilol 25 mg BID, clonidine 0.2 mg TID  Plan: out of goal . Increase carvedilol to 37.5 mg BID. Continue to monitor    Anemia of CKD:  H/H:   Lab Results   Component Value Date    HGB 10.0 (L) 04/25/2019     Lab Results   Component Value Date    HCT 31.5 (L) 04/25/2019     Iron panel:  No results found for: IRON, TIBC, FERRITIN  No results found for: LABIRON    Prior ESA use: none post transplant    Plan: stable. Continue to monitor.     DM:   Lab Results   Component Value Date    A1C 4.1 (L) 03/28/2019   . Goal A1c < 7  History of Dm? Yes: T2DM  Established with endocrinologist/PCP for BG managment? Yes: PCP  Currently on: no meds  BG log:  Date AC PC [facility] PC [facility] PC   04/18/2019 144  250  146    04/19/2019 126    146    04/20/2019 120    122    04/21/2019 125    230    04/22/2019 148    160    04/23/2019 159    149    Diet:did not address  Exercise:not yet  Hypoglycemia: no  Plan: start metformin XR 500 mg daily.  Eventually may benefit from GLP 1 Electrolytes: calcium 10.7, mag 1.4  Meds currently on: none  Plan: Continue to monitor     GI/BM: pt reports itermittent diarrhea (food related) and nausea); reports increased hearburn  Meds currently on: docusate PRN (BID), Miralax PRN, pantoprazole 40 mg daily  senna PRN   Plan: Take docusate out of pill box given intermittent diarrhea. Increase pantoprazole to 40 mg BID. Continue to monitor    Pain: pt reports moderate pain, mostly neuropathic around incision site + chronic back pain  Meds currently on: APAP PRN, oxycodone 10 mg q6h (doesn't find helpfuL0, pregabalin 75 mg BID  Plan: Encouraged her to attend [facility] apt tomorrowContinue to monitor    Bone health:   Vitamin D Level: 8.4 on 04/11/19. Goal > 30.   Last DEXA results:  none available  Current meds include: none  Plan: Vitamin D level  out of goal.  Given elevated calcium will not add at this time.  Continue to monitor.     Women's/Men's Health:  Michele Cisneros is a [name] perimenopausal. Patient reports no men's/women's health issues  Plan: Continue to monitor    Bladder spasm  Meds currently on: oxybutynin 5 mg TID   Plan: continue until she runs out then will re-evaluate if she needs to continue    Pharmacy preference:  [facility]    Adherence: Patient has poor understanding of medications; was not able to independently identify names/doses of immunosuppressants and OI meds.  Patient  does fill their own pill box on a regular basis at home.  Patient brought medication card:yes  Pill box:was correct  Plan: Encouraged patient to familiarize herself with meds; provided extensive adherence counseling/intervention  Spent approximately 40 minutes on educating this patient and greater than 50% was spent in direct face to face counseling regarding post transplant medication education. Questions and concerns were address to patient's satisfaction. Patient was reviewed with Dr. Zendel/Heather Marina Goodell, AGNP who was agreement with the stated plan:     During this visit, the following was completed:   BG log data assessment  BP log data assessment  Labs ordered and evaluated  complex treatment plan >1 DS   Patient education was completed for 11-24 minutes     All questions/concerns were addressed to the patient's satisfaction.  __________________________________________  Cleone Slim, PHARMD, CPP  [employer] TRANSPLANT CLINICAL PHARMACIST PRACTITIONER  PAGER [phone]

## 2019-04-25 NOTE — Unmapped (Signed)
TRANSPLANT SURGERY PROGRESS NOTE    Assessment and Plan  From a surgical stand point, Michele Cisneros is recovering well from her surgery. She is relatively well today but some n/v/d and her creatinine is downtrending. U/S on 11/10 concerning for edematous transplanted kidney.     1. Repeat renal ultrasound: 11/10 ultrasound showed mild edema around transplanted kidney which can be seen in the setting of rejection. Patient lives 45 minutes from [facility] and we recommend schedule her for a repeat ultrasound when convenient  for her.     2. Tacrolimus level: increase level of Tacrolimus since her last levels were low at 5.7.     3. Abdominal symptoms: She still has a bowel regimen and continues to have diarrhea. Stop the bowel regiment and follow to see if symptoms resolve.      4. General: encouraged to continue eating healthy and drink lots of fluids.     Subjective  Michele Cisneros is a 63 y.o. female with pmhx of CAD, HTN, COPD and ESRD 2/2 T2DM s/p DDKT on 10/21. Post- op course was complicated with delayed graft function and n/v/d. At discharge she had good urine output and the n/v has resolved.     Today she reports doing well. She reports good UOP. She had a renal ultrasound on 11/10 due to pain over the transplanted kidney. The pain has since resolved after treatment with oxybutynin and was probably 2/2 bladder spasm. The U/S showed mild edema around the transplanted kidney.     She reports some vomiting and diarrhea since discharge.     She is active at home and reports walking as much as she can tolerate.       Objective    Vitals:    04/25/19 1119   BP: 155/64   BP Site: R Arm   BP Position: Sitting   BP Cuff Size: Medium   Pulse: 77   Temp: 36.2 ??C (97.1 ??F)   TempSrc: Tympanic   SpO2: 100%   Weight: 74.4 kg (164 lb)   Height: 170.2 cm (5' 7)      Body mass index is 25.69 kg/m??.     Physical Exam:    General: Well- appearing women Abdominal: Non-distended abdomen with well-healed surgical site. Some tenderness to palpation. No oozing or redness around site.       Data Review:  All lab results last 24 hours:    Recent Results (from the past 24 hour(s))   Magnesium Level    Collection Time: 04/25/19  9:47 AM   Result Value Ref Range    Magnesium 1.4 (L) 1.6 - 2.2 mg/dL   Renal Function Panel    Collection Time: 04/25/19  9:47 AM   Result Value Ref Range    Sodium 137 135 - 145 mmol/L    Potassium 4.8 3.5 - 5.0 mmol/L    Chloride 105 98 - 107 mmol/L    CO2 23.0 22.0 - 30.0 mmol/L    Anion Gap 9 7 - 15 mmol/L    BUN 11 7 - 21 mg/dL    Creatinine 1.61 (H) 0.60 - 1.00 mg/dL    BUN/Creatinine Ratio 10     EGFR CKD-EPI Non-African American, Female 51 (L) >=60 mL/min/1.74m2    EGFR CKD-EPI African American, Female 59 (L) >=60 mL/min/1.72m2    Glucose 159 (H) 70 - 99 mg/dL    Calcium 09.6 (H) 8.5 - 10.2 mg/dL    Phosphorus 3.7 2.9 - 4.7 mg/dL  Albumin 4.1 3.5 - 5.0 g/dL   CBC w/ Differential    Collection Time: 04/25/19  9:48 AM   Result Value Ref Range    WBC 7.5 4.5 - 11.0 10*9/L    RBC 3.09 (L) 4.00 - 5.20 10*12/L    HGB 10.0 (L) 12.0 - 16.0 g/dL    HCT 16.1 (L) 09.6 - 46.0 %    MCV 102.2 (H) 80.0 - 100.0 fL    MCH 32.5 26.0 - 34.0 pg    MCHC 31.8 31.0 - 37.0 g/dL    RDW 04.5 (H) 40.9 - 15.0 %    MPV 9.6 7.0 - 10.0 fL    Platelet 314 150 - 440 10*9/L    Variable HGB Concentration Slight (A) Not Present    Neutrophils % 92.9 %    Lymphocytes % 2.0 %    Monocytes % 2.4 %    Eosinophils % 2.0 %    Basophils % 0.5 %    Neutrophil Left Shift 1+ (A) Not Present    Absolute Neutrophils 7.0 2.0 - 7.5 10*9/L    Absolute Lymphocytes 0.2 (L) 1.5 - 5.0 10*9/L    Absolute Monocytes 0.2 0.2 - 0.8 10*9/L    Absolute Eosinophils 0.2 0.0 - 0.4 10*9/L    Absolute Basophils 0.0 0.0 - 0.1 10*9/L    Large Unstained Cells 0 0 - 4 %    Macrocytosis Marked (A) Not Present    Anisocytosis Slight (A) Not Present    Hypochromasia Moderate (A) Not Present Morphology Review    Collection Time: 04/25/19  9:48 AM   Result Value Ref Range    Smear Review Comments See Comment (A) Undefined   Urinalysis    Collection Time: 04/25/19 11:17 AM   Result Value Ref Range    Color, UA Yellow     Clarity, UA Clear     Specific Gravity, UA 1.011 1.003 - 1.030    pH, UA 7.0 5.0 - 9.0    Leukocyte Esterase, UA Trace (A) Negative    Nitrite, UA Negative Negative    Protein, UA Negative Negative    Glucose, UA Negative Negative    Ketones, UA Trace (A) Negative    Urobilinogen, UA 0.2 mg/dL 0.2 mg/dL, 1.0 mg/dL    Bilirubin, UA Negative Negative    Blood, UA Small (A) Negative    RBC, UA 9 (H) <=4 /HPF    WBC, UA 7 (H) 0 - 5 /HPF    Squam Epithel, UA 1 0 - 5 /HPF    Bacteria, UA Rare (A) None Seen /HPF    Mucus, UA Rare (A) None Seen /HPF       Imaging:    Ultrasounds renal (11/10): Small, bilateral echogenic native kidneys, consistent with chronic medical renal disease. Resistive indices are globally increased from prior imaging in the renal transplant arteries. Mildly edematous appearance of transplant kidney, which may be seen in the setting of rejection.    Tomma Rakers,  Medical Student, MS3

## 2019-04-25 NOTE — Unmapped (Signed)
Transplant Nephrology Clinic Visit      History of Present Illness    63 y.o. female here for follow up after kidney transplantation.  Patient has ESRD secondary to DM II.  Patient has been on dialysis since 02/07/2014.  Patient's history includes HTN, CAD, CHF, HLD, COPD, PVD, sickle cell trait, and chronic back pain.     Patient was admitted for kidney transplant on 03/28/2019.  She received 1 unit pRBC on 03/30/2019 and 04/03/2019.  Patient had delayed graft function after transplant, but no dialysis was needed. RUS on 03/30/2019 showed 1.8 cm hypoechoic fluid collection abutting the left lower pole of the transplant kidney likely representing postsurgical seroma.  Patient discharged on 04/06/2019 to her home.       Transplant History:    Organ Received: Left DDKT, DBD, SCD, KDPI: 70%  Resp culture: + strep pneumo  Native Kidney Disease: DM II; cPRA 49%  Date of Transplant: 03/30/2019  Post-Transplant Course: DGF  Prior Transplants: No  Induction: [place]  Date of Ureteral Stent Removal: 05/09/2019  Current Immunosuppression: Tacrolimus/Myfortic  CMV/EBV Status: CMV D-/R+  EBV D+/R+  Rejection Episodes: None  Donor Specific Antibodies: None  Results of Renal Imaging (pre and post):      Pre-Txp 07/07/17  -Kidneys are small with mildly increased echogenicity bilaterally. No solid masses or calculi in the visualized kidneys. No hydronephrosis    CT Abdomen/Pelvis w/o Contrast 03/29/2019 (pre-txp)  -1.2 cm hypodensity in the right kidney lower pole (2:48), increased in size from prior however incompletely characterized given lack of intravenous contrast.  -Uterus is surgically absent.    Post-Txp 04/20/2019  -Atrophic echogenic kidneys with lobulated contours -The renal transplant was located in the left lower quadrant and appears mildly edematous with less distinct margins than on previous examination. Nephroureteral stent positioned within the distal renal pelvis and extending into the bladder. Normal size and echogenicity.  No solid masses or calculi. No perinephric collections identified. No hydronephrosis. Prior fluid collection at the inferior pole of the transplanted kidney has not well appreciated on this study.  -Resistive indices are globally increased from prior imaging in the renal transplant arteries. Mildly edematous appearance of transplant kidney, which may be seen in the setting of rejection.  -Increased velocities in the main renal artery and its midportion, possibly artifactual. Attention on follow-up examinations.    Current Immunosuppression Regimen:   Envarsus 10 mg daily  Myfortic 540 mg BID      Subjective/Interval:   Patient presents today stating the pain has gotten much better.  She states she still has soreness over the incision site with palpation, but the stabbing sharp pain has stopped.  Patient's BP at home running 160-180/70-80's in AM and 130-160/60-70's in PM.  Patient states she is having headaches, but denies chest pain, heart palpitations, and shortness of breath.  She is only having Diarrhea about twice a week now, which is an improvement.  Her acid reflux is worse and sometimes causes her to be nauseated and vomit.  She complains that her hand tremors have gotten worse and that it is hard to feed herself.  She continues to have periods thath when she finishes going to the bathroom, once she stands she has to go again and then is able to completely empty her bladder. Patient still has her stent, which is scheduled to be removed on 05/09/19.  She is currently following with a cardiologist with [facility] and a Neurosurgeon at [facility] (appt on 04/26/19) which she states  she wants to cancel due to her being immunocompromised.  Discussed with patient and she agreed to change it to virtual. All information in care everywhere    Patient denies lightheadedness, dizziness, fever/chills, dysuria, hematuria, burning with urination, discharge, and edema.     Last dose of Prograf: 9:00 AM    Past Medical History  1. DM II  2. HTN  3. HLD  4. CAD  5. CHF  6. COPD  7. PVD  8. Sickle Cell Trait  9. DDD with chronic pain    Review of Systems      Otherwise as per HPI, all other systems reviewed and are negative.    Medications  Current Outpatient Medications   Medication Sig Dispense Refill   ??? acetaminophen (TYLENOL) 500 MG tablet Take 1-2 tablets (500-1,000 mg total) by mouth every six (6) hours as needed for pain or fever (> 38C or 100.53F). 30 tablet 0   ??? albuterol (PROVENTIL HFA;VENTOLIN HFA) 90 mcg/actuation inhaler Inhale 2 puffs every six (6) hours as needed for wheezing.     ??? amLODIPine (NORVASC) 10 MG tablet Take 1 tablet (10 mg total) by mouth daily. 30 tablet 11   ??? aspirin (ECOTRIN) 81 MG tablet Take 81 mg by mouth daily.     ??? atorvastatin (LIPITOR) 40 MG tablet Take 1 tablet (40 mg total) by mouth daily. 30 tablet 11   ??? blood sugar diagnostic (CONTOUR TEST STRIPS) Strp TEST BLOOD SUGARS 2 TIMES DAILY     ??? blood-glucose meter (ONETOUCH ULTRA SYSTEM KIT) kit Please dispense for patient to use at home to check blood sugars.     ??? carvediloL (COREG) 12.5 MG tablet Take 2 tablets (25 mg total) by mouth Two (2) times a day. 120 tablet 11   ??? cloNIDine HCL (CATAPRES) 0.1 MG tablet Take 2 tablets (0.2 mg total) by mouth every eight (8) hours. 180 tablet 11 ??? docusate sodium (COLACE) 100 MG capsule Take 1 capsule (100 mg total) by mouth two (2) times a day as needed for constipation. 60 capsule 0   ??? LANCETS,THIN MISC Using at home to test blood sugars twice daily.     ??? magnesium oxide-Mg AA chelate (MAGNESIUM, AMINO ACID CHELATE,) 133 mg Tab Take 1 tablet by mouth Two (2) times a day. HOLD until directed to start by your coordinator. (Patient not taking: Reported on 04/11/2019) 100 tablet 11   ??? mycophenolate (MYFORTIC) 180 MG EC tablet Take 3 tablets (540 mg total) by mouth Two (2) times a day. 180 tablet 11   ??? nitroglycerin (NITROSTAT) 0.4 MG SL tablet Place 0.4 mg under the tongue daily as needed.     ??? oxybutynin (DITROPAN) 5 MG tablet Take 1 tablet (5 mg total) by mouth Three (3) times a day for 14 days. 42 tablet 0   ??? oxyCODONE (ROXICODONE) 5 MG immediate release tablet Take 1-2 tablets (5-10 mg total) by mouth every six (6) hours as needed for pain. 40 tablet 0   ??? pantoprazole (PROTONIX) 40 MG tablet Take 1 tablet (40 mg total) by mouth daily. 30 tablet 11   ??? polyethylene glycol (GLYCOLAX) 17 gram/dose powder Mix 1 capful (17 g) in 4 to 8 ounces water, juice, soda, coffee, or tea and drink daily as needed. 510 g 0   ??? pregabalin (LYRICA) 75 MG capsule Take 1 capsule (75 mg total) by mouth Two (2) times a day. 60 capsule 3   ??? senna (SENOKOT) 8.6 mg tablet Take  1 tablet by mouth nightly as needed for constipation. 30 tablet 11   ??? sulfamethoxazole-trimethoprim (BACTRIM) 400-80 mg per tablet Take 1 tablet (80 mg of trimethoprim total) by mouth Every Monday, Wednesday, and Friday. 12 tablet 5   ??? tacrolimus (ENVARSUS XR) 1 mg Tb24 extended release tablet Take 2 (1mg ) tablets with 2 (4mg ) tablets by mouth daily. Total daily dose = 10mg . 60 tablet 11   ??? tacrolimus (ENVARSUS XR) 4 mg Tb24 extended release tablet Take 2 (4mg ) tablets with 2 (1mg ) tablets by mouth daily. Total daily dose = 10mg . 60 tablet 11 ??? valGANciclovir (VALCYTE) 450 mg tablet Take 1 tablet (450 mg total) by mouth daily. 30 tablet 2     No current facility-administered medications for this visit.            Physical Exam  BP 155/64 (BP Site: R Arm, BP Position: Sitting, BP Cuff Size: Medium)  - Pulse 77  - Temp 36.2 ??C (97.1 ??F) (Tympanic)  - Ht 170.2 cm (5' 7)  - Wt 74.4 kg (164 lb)  - SpO2 100%  - BMI 25.69 kg/m??   General: no acute distress  HEENT: wearing mask PERRL  Neck: neck supple, no cervical lymphadenopathy appreciated  CV: normal rate, normal rhythm, no murmur, no gallops, no rubs appreciated  Lungs: clear to auscultation bilaterally  Abdomen: soft, tender over graft site;  Staples present, c/d/i; no drainage or odor noted; no erythema;   Extremities: no edema  Musculoskeletal: no visible deformity, normal range of motion.  Pulses: intact distally throughout  Neurologic: awake, alert, and oriented x3      Laboratory Data and Imaging reviewed in EPIC      Assessment:  63 y.o. female status post deceased donor kidney transplant on 03/30/2019 for ESRD secondary to DM II who presents for routine follow up and post-transplant care.         Recommendations/Plan:     Allograft Function: Renal function stable at 1.15 after continuing to decrease since transplant.   UPC normal at 0.165    Immunosuppression Management [High Risk Medical Decision Making For Drug Therapy Requiring Intensive Monitoring For Toxicity]: Tacrolimus trough level 5.1. Increase Tacrolimus 11 mg daily.  Targeting tacrolimus trough levels of approximately 8-10 ng/mL.   Continue mycophenolate 540 mg BID.     Blood Pressure Management: BP 155/64 at this visit. Continue current regimen, but will increase Coreg to 37.5 mg BID today. Will continue to monitor BP in relation to pain.      Lipid Management: Last lipid panel on 07/07/2017.  Continue atorvastatin.      Electrolytes: Electrolyte and metabolic parameters in acceptable range.  Will continue to monitor Infectious Prophylaxis and Monitoring: CMV VL not drawn.  BK VL not detected.  Will continue to monitor.   The patient continues on Valcyte (end 06/29/19) and Bactrim (end 09/28/19) prophylaxis.   Hep C antibody came back positive on 03/28/19.  Unsure if true result.  HCV Antibody and VL drawn today for confirmation.     Pulmonary Nodule: Seen on NM Stress 07/07/17.  Subcentimeter nodule is stable from previous study 03/18/16 and 03/07/14    Lumbar Radiculopathy: Following with [facility] with next appt 04/26/2019 with MRI of spine.  Patient states she is going to change it to a virtual appointment.  Chronic low back pain with numbness to BLE at time.  Sometimes Right foot drags as well.  Plan is for surgery in the future for Lumbosacral DDD.  Right Breast Soft Tissue Mass: no size change since 03/18/2016.  Needs an updated mammogram.    Neuropathy: Continue on Lyrica.  Follows with [facility].      LLQ Pain: Pain has significantly improved since starting Ditropan.  Still has pain with palpation, but no longer having sharp pain.     GERD: Increase Protonix to BID.     Health Maintenance:   Mammo: 2018, needs  Pap Smear: Hysterectomy  Colonoscopy: 08/20/2017; repeat 5 years due to multiple tubular adenomas    Immunizations:   Flu Shot: when 30 days post transplant  Prevnar 13: not noted in records from 2012 and after.  Will get after 1 year post transplant  Pneumovax: 03/23/18    Counseling:  I counseled the patient on:  The need to avoid sun exposure and the use of sunblock while outdoors given the relatively higher risk of skin malignancy in an immunosuppressed state.  The need for adherence to immunosuppression medication.  Patient verbalized understanding.     Follow-Up:  Return to clinic 2 weeks (05/10/19)  Flu Shot next visit  Labs: 2 times a week  Repeat RUS per Dr. Doyne Keel 1 week from prior  Appt with [facility] 04/26/19 - pt to change to virtual Patient will continue to follow-up with his primary care provider for non-transplant related issues and medication refills. We have ordered transplant specific labs per the center's guidelines to monitor and assess for toxicities from immunosuppressant drug therapy.    Also seen by Dr. Doyne Keel today.          Michele Cisneros L. Marina Goodell, MSN, APRN, AGNP-C - Kidney Transplant Nurse Practitioner  [facility]

## 2019-04-25 NOTE — Unmapped (Signed)
Michele Cisneros    Michele Cisneros, DOB: 1955-12-16  Phone: [phone] (home)     All above HIPAA information was verified with patient.     Specialty Medication(s):   Transplant: Envarsus 1mg , Envarsus 4mg ,  mycophenolic acid 1mg  and valgancyclovir 450mg      Current Outpatient Medications   Medication Sig Dispense Refill   ??? acetaminophen (TYLENOL) 500 MG tablet Take 1-2 tablets (500-1,000 mg total) by mouth every six (6) hours as needed for pain or fever (> 38C or 100.89F). 30 tablet 0   ??? albuterol (PROVENTIL HFA;VENTOLIN HFA) 90 mcg/actuation inhaler Inhale 2 puffs every six (6) hours as needed for wheezing.     ??? amLODIPine (NORVASC) 10 MG tablet Take 1 tablet (10 mg total) by mouth daily. 30 tablet 11   ??? aspirin (ECOTRIN) 81 MG tablet Take 81 mg by mouth daily.     ??? atorvastatin (LIPITOR) 40 MG tablet Take 1 tablet (40 mg total) by mouth daily. 30 tablet 11   ??? blood sugar diagnostic (CONTOUR TEST STRIPS) Strp by Other route two (2) times a day. TEST BLOOD SUGARS 2 TIMES DAILY 60 strip 5   ??? blood-glucose meter (ONETOUCH ULTRA SYSTEM KIT) kit Please dispense for patient to use at home to check blood sugars.     ??? carvediloL (COREG) 12.5 MG tablet Take 3 tablets (37.5 mg total) by mouth Two (2) times a day. 180 tablet 11   ??? cloNIDine HCL (CATAPRES) 0.1 MG tablet Take 2 tablets (0.2 mg total) by mouth every eight (8) hours. 180 tablet 11   ??? docusate sodium (COLACE) 100 MG capsule Take 1 capsule (100 mg total) by mouth two (2) times a day as needed for constipation. 60 capsule 0   ??? LANCETS,THIN MISC Using at home to test blood sugars twice daily.     ??? magnesium oxide-Mg AA chelate (MAGNESIUM, AMINO ACID CHELATE,) 133 mg Tab Take 1 tablet by mouth Two (2) times a day. HOLD until directed to start by your coordinator. 100 tablet 11 ??? metFORMIN (GLUCOPHAGE XR) 500 MG 24 hr tablet Take 1 tablet (500 mg total) by mouth daily. 30 tablet 11   ??? mycophenolate (MYFORTIC) 180 MG EC tablet Take 3 tablets (540 mg total) by mouth Two (2) times a day. 180 tablet 11   ??? nitroglycerin (NITROSTAT) 0.4 MG SL tablet Place 0.4 mg under the tongue daily as needed.     ??? oxybutynin (DITROPAN) 5 MG tablet Take 1 tablet (5 mg total) by mouth Three (3) times a day for 14 days. 42 tablet 0   ??? oxyCODONE (ROXICODONE) 5 MG immediate release tablet Take 1-2 tablets (5-10 mg total) by mouth every six (6) hours as needed for pain. 40 tablet 0   ??? pantoprazole (PROTONIX) 40 MG tablet Take 1 tablet (40 mg total) by mouth Two (2) times a day. 60 tablet 11   ??? polyethylene glycol (GLYCOLAX) 17 gram/dose powder Mix 1 capful (17 g) in 4 to 8 ounces water, juice, soda, coffee, or tea and drink daily as needed. 510 g 0   ??? pregabalin (LYRICA) 75 MG capsule Take 1 capsule (75 mg total) by mouth Two (2) times a day. 60 capsule 3   ??? senna (SENOKOT) 8.6 mg tablet Take 1 tablet by mouth nightly as needed for constipation. 30 tablet 11   ??? sulfamethoxazole-trimethoprim (BACTRIM) 400-80 mg per tablet Take 1 tablet (80 mg of trimethoprim  total) by mouth Every Monday, Wednesday, and Friday. 12 tablet 5   ??? tacrolimus (ENVARSUS XR) 1 mg Tb24 extended release tablet Take 2 (1mg ) tablets with 2 (4mg ) tablets by mouth daily. Total daily dose = 10mg . 60 tablet 11   ??? tacrolimus (ENVARSUS XR) 4 mg Tb24 extended release tablet Take 2 (4mg ) tablets with 2 (1mg ) tablets by mouth daily. Total daily dose = 10mg . 60 tablet 11   ??? valGANciclovir (VALCYTE) 450 mg tablet Take 1 tablet (450 mg total) by mouth daily. 30 tablet 2     No current facility-administered medications for this visit.         Changes to medications: Michele Cisneros reports no changes at this time.    Allergies   Allergen Reactions   ??? Hydrocodone Hives   ??? Lisinopril      Unknown reaction        Changes to allergies: No SPECIALTY MEDICATION ADHERENCE     Envarsus Xr 4 mg: 14 days of medicine on hand   Envarsus Xr 1 mg: 14 days of medicine on hand   Mycophenolate 180 mg: 14 days of medicine on hand   Valganciclovor 450 mg: 14 days of medicine on hand       Medication Adherence    Patient reported X missed doses in the last month: 0  Specialty Medication: Envarsus Xr 1mg   Patient is on additional specialty medications: Yes  Additional Specialty Medications: Envarsus Xr 4mg   Patient Reported Additional Medication X Missed Doses in the Last Month: 0  Patient is on more than two specialty medications: Yes  Specialty Medication: Mycophenolate 180mg   Patient Reported Additional Medication X Missed Doses in the Last Month: 0  Specialty Medication: Valganciclovir 450mg   Patient Reported Additional Medication X Missed Doses in the Last Month: 0          Specialty medication(s) dose(s) confirmed: Regimen is correct and unchanged.     Are there any concerns with adherence? No    Adherence counseling provided? Not needed    CLINICAL MANAGEMENT AND INTERVENTION      Clinical Benefit Assessment:    Do you feel the medicine is effective or helping your condition? Yes    Clinical Benefit counseling provided? Not needed    Adverse Effects Assessment:    Are you experiencing any side effects? Yes, patient reports experiencing shakiness.. Side effect counseling provided: Michele Cisneros stated her doctor was aware.    Are you experiencing difficulty administering your medicine? No    Quality of Life Assessment:    How many days over the past month did your kidney transplant  keep you from your normal activities? For example, brushing your teeth or getting up in the morning. 0    Have you discussed this with your provider? Not needed    Therapy Appropriateness:    Is therapy appropriate? Yes, therapy is appropriate and should be continued    DISEASE/MEDICATION-SPECIFIC INFORMATION      N/A    PATIENT SPECIFIC NEEDS ? Does the patient have any physical, cognitive, or cultural barriers? No    ? Is the patient high risk? Yes, patient taking a REMS drug     ? Does the patient require a Care Management Plan? No     ? Does the patient require physician intervention or other additional services (i.e. nutrition, smoking cessation, social work)? No      SHIPPING     Specialty Medication(s) to be Shipped:   Transplant: None- Ms. Going denied  needing envarsus, mycophenolate and valgaciclovir today.    Other medication(s) to be shipped: Carvedilol, Metformin, Pantoprazole.     Changes to insurance: No    Delivery Scheduled: Yes, Expected medication delivery date: 04/27/2019.     Medication will be delivered via UPS to the confirmed prescription address in [facility].    The patient will receive a drug information handout for each medication shipped and additional FDA Medication Guides as required.  Verified that patient has previously received a Conservation officer, historic buildings.    All of the patient's questions and concerns have been addressed.    Tera Helper   [facility] Specialty Pharmacist

## 2019-04-25 NOTE — Unmapped (Signed)
Per laura Pharm D increase envarsus to 11mg  daily.    Called and updated patient who verbalized understanding but asked I call her sister and let her know too    Called her sister Clotilde Dieter and went over new dose. She wrote down information and will adjust there pill box.    Denies any other needs

## 2019-04-26 DIAGNOSIS — Z940 Kidney transplant status: Principal | ICD-10-CM

## 2019-04-26 DIAGNOSIS — Z79899 Other long term (current) drug therapy: Principal | ICD-10-CM

## 2019-04-26 LAB — HEPATITIS C RNA, QUANTITATIVE, PCR: HCV RNA: NOT DETECTED

## 2019-04-26 LAB — HCV RNA(IU): Hepatitis C virus RNA:ACnc:Pt:Ser/Plas:Qn:Probe.amp.tar: 0

## 2019-04-26 MED FILL — METFORMIN ER 500 MG TABLET,EXTENDED RELEASE 24 HR: 30 days supply | Qty: 30 | Fill #0 | Status: AC

## 2019-04-26 MED FILL — CARVEDILOL 12.5 MG TABLET: 30 days supply | Qty: 180 | Fill #0 | Status: AC

## 2019-04-26 MED FILL — PANTOPRAZOLE 40 MG TABLET,DELAYED RELEASE: 30 days supply | Qty: 60 | Fill #0 | Status: AC

## 2019-04-27 DIAGNOSIS — Z940 Kidney transplant status: Principal | ICD-10-CM

## 2019-04-27 DIAGNOSIS — Z79899 Other long term (current) drug therapy: Secondary | ICD-10-CM | POA: Diagnosis not present

## 2019-04-27 MED ORDER — TACROLIMUS ER 4 MG TABLET,EXTENDED RELEASE 24 HR
ORAL_TABLET | 11 refills | 0 days | Status: CP
Start: 2019-04-27 — End: ?

## 2019-04-27 MED ORDER — TACROLIMUS ER 1 MG TABLET,EXTENDED RELEASE 24 HR
ORAL_TABLET | 11 refills | 0 days | Status: CP
Start: 2019-04-27 — End: ?

## 2019-04-28 ENCOUNTER — Ambulatory Visit: Admit: 2019-04-28 | Discharge: 2019-04-29 | Payer: MEDICARE

## 2019-04-28 DIAGNOSIS — Z79899 Other long term (current) drug therapy: Principal | ICD-10-CM

## 2019-04-28 DIAGNOSIS — Z940 Kidney transplant status: Principal | ICD-10-CM

## 2019-04-28 DIAGNOSIS — D849 Immunodeficiency, unspecified: Secondary | ICD-10-CM | POA: Diagnosis not present

## 2019-04-28 DIAGNOSIS — R93429 Abnormal radiologic findings on diagnostic imaging of unspecified kidney: Secondary | ICD-10-CM | POA: Diagnosis not present

## 2019-04-28 LAB — CBC W/ DIFFERENTIAL
BANDED NEUTROPHILS ABSOLUTE COUNT: 0 10*3/uL (ref 0.0–0.1)
BASOPHILS ABSOLUTE COUNT: 0 10*3/uL (ref 0.0–0.2)
BASOPHILS RELATIVE PERCENT: 0 %
EOSINOPHILS ABSOLUTE COUNT: 0.1 10*3/uL (ref 0.0–0.4)
EOSINOPHILS RELATIVE PERCENT: 1 %
HEMATOCRIT: 26.7 % — ABNORMAL LOW (ref 34.0–46.6)
HEMOGLOBIN: 8.9 g/dL — ABNORMAL LOW (ref 11.1–15.9)
IMMATURE GRANULOCYTES: 0 %
LYMPHOCYTES ABSOLUTE COUNT: 0.1 10*3/uL — ABNORMAL LOW (ref 0.7–3.1)
LYMPHOCYTES RELATIVE PERCENT: 1 %
MEAN CORPUSCULAR HEMOGLOBIN CONC: 33.3 g/dL (ref 31.5–35.7)
MEAN CORPUSCULAR HEMOGLOBIN: 32.8 pg (ref 26.6–33.0)
MEAN CORPUSCULAR VOLUME: 99 fL — ABNORMAL HIGH (ref 79–97)
MONOCYTES RELATIVE PERCENT: 4 %
NEUTROPHILS ABSOLUTE COUNT: 6.2 10*3/uL (ref 1.4–7.0)
NEUTROPHILS RELATIVE PERCENT: 94 %
PLATELET COUNT: 258 10*3/uL (ref 150–450)
RED BLOOD CELL COUNT: 2.71 x10E6/uL — CL (ref 3.77–5.28)
RED CELL DISTRIBUTION WIDTH: 15.5 % — ABNORMAL HIGH (ref 11.7–15.4)

## 2019-04-28 LAB — GLUCOSE: Glucose:MCnc:Pt:Ser/Plas:Qn:: 114 — ABNORMAL HIGH

## 2019-04-28 LAB — RENAL FUNCTION PANEL
ALBUMIN: 3.6 g/dL — ABNORMAL LOW (ref 3.8–4.8)
BLOOD UREA NITROGEN: 8 mg/dL (ref 8–27)
BUN / CREAT RATIO: 7 — ABNORMAL LOW (ref 12–28)
CALCIUM: 10.2 mg/dL (ref 8.7–10.3)
CHLORIDE: 107 mmol/L — ABNORMAL HIGH (ref 96–106)
GFR MDRD AF AMER: 57 mL/min/{1.73_m2} — ABNORMAL LOW
GFR MDRD NON AF AMER: 49 mL/min/{1.73_m2} — ABNORMAL LOW
GLUCOSE: 114 mg/dL — ABNORMAL HIGH (ref 65–99)
PHOSPHORUS, SERUM: 3.1 mg/dL (ref 3.0–4.3)
POTASSIUM: 4.6 mmol/L (ref 3.5–5.2)
SODIUM: 139 mmol/L (ref 134–144)

## 2019-04-28 LAB — MEAN CORPUSCULAR HEMOGLOBIN: Erythrocyte mean corpuscular hemoglobin:EntMass:Pt:RBC:Qn:Automated count: 32.8

## 2019-04-28 LAB — MAGNESIUM: Magnesium:MCnc:Pt:Ser/Plas:Qn:: 1.4 — ABNORMAL LOW

## 2019-04-28 NOTE — Unmapped (Signed)
Change in Envarsus dosage decrease. Co-pay $0.00. Last filled 04/06/2019 on Part B.

## 2019-04-29 DIAGNOSIS — Z79899 Other long term (current) drug therapy: Secondary | ICD-10-CM | POA: Diagnosis not present

## 2019-04-29 DIAGNOSIS — Z940 Kidney transplant status: Secondary | ICD-10-CM | POA: Diagnosis not present

## 2019-04-29 LAB — TACROLIMUS BLOOD: Tacrolimus:MCnc:Pt:Bld:Qn:LC/MS/MS: 8

## 2019-04-30 LAB — CBC W/ DIFFERENTIAL
BANDED NEUTROPHILS ABSOLUTE COUNT: 0 10*3/uL (ref 0.0–0.1)
BASOPHILS ABSOLUTE COUNT: 0 10*3/uL (ref 0.0–0.2)
BASOPHILS RELATIVE PERCENT: 1 %
EOSINOPHILS ABSOLUTE COUNT: 0.1 10*3/uL (ref 0.0–0.4)
HEMATOCRIT: 26.5 % — ABNORMAL LOW (ref 34.0–46.6)
HEMOGLOBIN: 8.9 g/dL — ABNORMAL LOW (ref 11.1–15.9)
IMMATURE GRANULOCYTES: 1 %
LYMPHOCYTES ABSOLUTE COUNT: 0.1 10*3/uL — ABNORMAL LOW (ref 0.7–3.1)
LYMPHOCYTES RELATIVE PERCENT: 1 %
MEAN CORPUSCULAR HEMOGLOBIN CONC: 33.6 g/dL (ref 31.5–35.7)
MEAN CORPUSCULAR HEMOGLOBIN: 31 pg (ref 26.6–33.0)
MEAN CORPUSCULAR VOLUME: 92 fL (ref 79–97)
MONOCYTES RELATIVE PERCENT: 4 %
NEUTROPHILS ABSOLUTE COUNT: 5.3 10*3/uL (ref 1.4–7.0)
NEUTROPHILS RELATIVE PERCENT: 92 %
PLATELET COUNT: 251 10*3/uL (ref 150–450)
RED BLOOD CELL COUNT: 2.87 x10E6/uL — ABNORMAL LOW (ref 3.77–5.28)
WHITE BLOOD CELL COUNT: 5.7 10*3/uL (ref 3.4–10.8)

## 2019-04-30 LAB — RENAL FUNCTION PANEL
ALBUMIN: 3.6 g/dL — ABNORMAL LOW (ref 3.8–4.8)
BLOOD UREA NITROGEN: 8 mg/dL (ref 8–27)
BUN / CREAT RATIO: 6 — ABNORMAL LOW (ref 12–28)
CO2: 17 mmol/L — ABNORMAL LOW (ref 20–29)
CREATININE: 1.31 mg/dL — ABNORMAL HIGH (ref 0.57–1.00)
GFR MDRD AF AMER: 51 mL/min/{1.73_m2} — ABNORMAL LOW
GFR MDRD NON AF AMER: 44 mL/min/{1.73_m2} — ABNORMAL LOW
GLUCOSE: 132 mg/dL — ABNORMAL HIGH (ref 65–99)
PHOSPHORUS, SERUM: 3.4 mg/dL (ref 3.0–4.3)

## 2019-04-30 LAB — MAGNESIUM: Magnesium:MCnc:Pt:Ser/Plas:Qn:: 1.5 — ABNORMAL LOW

## 2019-04-30 LAB — EOSINOPHILS RELATIVE PERCENT: Eosinophils/100 leukocytes:NFr:Pt:Bld:Qn:Automated count: 1

## 2019-04-30 LAB — CHLORIDE: Chloride:SCnc:Pt:Ser/Plas:Qn:: 111 — ABNORMAL HIGH

## 2019-05-01 ENCOUNTER — Emergency Department
Admission: EM | Admit: 2019-05-01 | Discharge: 2019-05-01 | Discharge status: Absent without leave | Payer: Medicare Other

## 2019-05-01 DIAGNOSIS — Z940 Kidney transplant status: Secondary | ICD-10-CM | POA: Diagnosis not present

## 2019-05-01 DIAGNOSIS — K2100 Gastro-esophageal reflux disease with esophagitis, without bleeding: Secondary | ICD-10-CM | POA: Diagnosis not present

## 2019-05-01 DIAGNOSIS — Z20828 Contact with and (suspected) exposure to other viral communicable diseases: Secondary | ICD-10-CM | POA: Diagnosis not present

## 2019-05-01 DIAGNOSIS — R0789 Other chest pain: Secondary | ICD-10-CM | POA: Diagnosis not present

## 2019-05-01 DIAGNOSIS — Z6826 Body mass index (BMI) 26.0-26.9, adult: Secondary | ICD-10-CM | POA: Diagnosis not present

## 2019-05-01 DIAGNOSIS — R112 Nausea with vomiting, unspecified: Secondary | ICD-10-CM | POA: Diagnosis not present

## 2019-05-01 DIAGNOSIS — R079 Chest pain, unspecified: Secondary | ICD-10-CM | POA: Diagnosis not present

## 2019-05-01 DIAGNOSIS — I5032 Chronic diastolic (congestive) heart failure: Secondary | ICD-10-CM | POA: Diagnosis not present

## 2019-05-01 DIAGNOSIS — I130 Hypertensive heart and chronic kidney disease with heart failure and stage 1 through stage 4 chronic kidney disease, or unspecified chronic kidney disease: Secondary | ICD-10-CM | POA: Diagnosis not present

## 2019-05-01 DIAGNOSIS — D849 Immunodeficiency, unspecified: Secondary | ICD-10-CM | POA: Diagnosis not present

## 2019-05-01 DIAGNOSIS — K2210 Ulcer of esophagus without bleeding: Secondary | ICD-10-CM | POA: Diagnosis not present

## 2019-05-01 LAB — CBC W/ AUTO DIFF
BASOPHILS ABSOLUTE COUNT: 0 10*9/L (ref 0.0–0.1)
EOSINOPHILS ABSOLUTE COUNT: 0.1 10*9/L (ref 0.0–0.4)
EOSINOPHILS RELATIVE PERCENT: 0.4 %
HEMATOCRIT: 31.4 % — ABNORMAL LOW (ref 36.0–46.0)
HEMOGLOBIN: 10.2 g/dL — ABNORMAL LOW (ref 12.0–16.0)
LARGE UNSTAINED CELLS: 0 % (ref 0–4)
LYMPHOCYTES ABSOLUTE COUNT: 0.1 10*9/L — ABNORMAL LOW (ref 1.5–5.0)
LYMPHOCYTES RELATIVE PERCENT: 1 %
MEAN CORPUSCULAR HEMOGLOBIN CONC: 32.6 g/dL (ref 31.0–37.0)
MEAN CORPUSCULAR HEMOGLOBIN: 32.6 pg (ref 26.0–34.0)
MEAN CORPUSCULAR VOLUME: 100.1 fL — ABNORMAL HIGH (ref 80.0–100.0)
MEAN PLATELET VOLUME: 10.6 fL — ABNORMAL HIGH (ref 7.0–10.0)
MONOCYTES ABSOLUTE COUNT: 0.2 10*9/L (ref 0.2–0.8)
MONOCYTES RELATIVE PERCENT: 2.2 %
NEUTROPHILS ABSOLUTE COUNT: 10.6 10*9/L — ABNORMAL HIGH (ref 2.0–7.5)
NEUTROPHILS RELATIVE PERCENT: 95.9 %
PLATELET COUNT: 224 10*9/L (ref 150–440)
RED BLOOD CELL COUNT: 3.14 10*12/L — ABNORMAL LOW (ref 4.00–5.20)
WBC ADJUSTED: 11.1 10*9/L — ABNORMAL HIGH (ref 4.5–11.0)

## 2019-05-01 LAB — BLOOD GAS, VENOUS
BASE EXCESS VENOUS: -6.9 — ABNORMAL LOW (ref -2.0–2.0)
HCO3 VENOUS: 17 mmol/L — ABNORMAL LOW (ref 22–27)
O2 SATURATION VENOUS: 86.2 % — ABNORMAL HIGH (ref 40.0–85.0)
PCO2 VENOUS: 28 mmHg — ABNORMAL LOW (ref 40–60)
PH VENOUS: 7.4 (ref 7.32–7.43)

## 2019-05-01 LAB — TACROLIMUS BLOOD: Tacrolimus:MCnc:Pt:Bld:Qn:LC/MS/MS: 8.3

## 2019-05-01 LAB — PCO2 VENOUS: Lab: 28 — ABNORMAL LOW

## 2019-05-01 LAB — PROTIME-INR: PROTIME: 13.1 s (ref 10.5–13.5)

## 2019-05-01 LAB — LACTATE BLOOD VENOUS: Lactate:SCnc:Pt:BldV:Qn:: 1

## 2019-05-01 LAB — HYPOCHROMIA

## 2019-05-01 LAB — APTT: Coagulation surface induced:Time:Pt:PPP:Qn:Coag: 36.8

## 2019-05-01 LAB — PROTIME: Coagulation tissue factor induced:Time:Pt:PPP:Qn:Coag: 13.1

## 2019-05-02 ENCOUNTER — Ambulatory Visit: Admit: 2019-05-02 | Discharge: 2019-05-10 | Disposition: A | Discharge status: Home Health | Payer: MEDICARE

## 2019-05-02 ENCOUNTER — Encounter: Admit: 2019-05-02 | Discharge: 2019-05-10 | Disposition: A | Discharge status: Home Health | Payer: MEDICARE

## 2019-05-02 DIAGNOSIS — Z79899 Other long term (current) drug therapy: Principal | ICD-10-CM

## 2019-05-02 DIAGNOSIS — Z940 Kidney transplant status: Principal | ICD-10-CM

## 2019-05-02 DIAGNOSIS — D72829 Elevated white blood cell count, unspecified: Secondary | ICD-10-CM | POA: Diagnosis not present

## 2019-05-02 DIAGNOSIS — R109 Unspecified abdominal pain: Secondary | ICD-10-CM | POA: Diagnosis not present

## 2019-05-02 DIAGNOSIS — R112 Nausea with vomiting, unspecified: Secondary | ICD-10-CM | POA: Diagnosis not present

## 2019-05-02 DIAGNOSIS — R911 Solitary pulmonary nodule: Secondary | ICD-10-CM | POA: Diagnosis not present

## 2019-05-02 DIAGNOSIS — I120 Hypertensive chronic kidney disease with stage 5 chronic kidney disease or end stage renal disease: Secondary | ICD-10-CM | POA: Diagnosis not present

## 2019-05-02 DIAGNOSIS — N261 Atrophy of kidney (terminal): Secondary | ICD-10-CM | POA: Diagnosis not present

## 2019-05-02 DIAGNOSIS — R030 Elevated blood-pressure reading, without diagnosis of hypertension: Secondary | ICD-10-CM | POA: Diagnosis not present

## 2019-05-02 DIAGNOSIS — I10 Essential (primary) hypertension: Secondary | ICD-10-CM | POA: Diagnosis not present

## 2019-05-02 DIAGNOSIS — N186 End stage renal disease: Secondary | ICD-10-CM | POA: Diagnosis not present

## 2019-05-02 DIAGNOSIS — D849 Immunodeficiency, unspecified: Secondary | ICD-10-CM | POA: Diagnosis not present

## 2019-05-02 LAB — URINALYSIS
BILIRUBIN UA: NEGATIVE
GLUCOSE UA: 150 — AB
KETONES UA: 80 — AB
NITRITE UA: NEGATIVE
PH UA: 5 (ref 5.0–9.0)
PROTEIN UA: NEGATIVE
RBC UA: 2 /HPF (ref <=4)
SPECIFIC GRAVITY UA: 1.013 (ref 1.003–1.030)
UROBILINOGEN UA: 0.2
WBC UA: 3 /HPF (ref 0–5)

## 2019-05-02 LAB — PRO-BNP
Natriuretic peptide.B prohormone N-Terminal:MCnc:Pt:Ser/Plas:Qn:: 2820 — ABNORMAL HIGH
Natriuretic peptide.B prohormone N-Terminal:MCnc:Pt:Ser/Plas:Qn:: 3890 — ABNORMAL HIGH
PRO-BNP: 2820 pg/mL — ABNORMAL HIGH (ref 0.0–226.0)

## 2019-05-02 LAB — COMPREHENSIVE METABOLIC PANEL
ALBUMIN: 4.1 g/dL (ref 3.5–5.0)
ALKALINE PHOSPHATASE: 109 U/L (ref 38–126)
ALT (SGPT): 9 U/L (ref <35)
ANION GAP: 15 mmol/L (ref 7–15)
AST (SGOT): 27 U/L (ref 14–38)
BILIRUBIN TOTAL: 0.8 mg/dL (ref 0.0–1.2)
BLOOD UREA NITROGEN: 13 mg/dL (ref 7–21)
BUN / CREAT RATIO: 13
CALCIUM: 10.6 mg/dL — ABNORMAL HIGH (ref 8.5–10.2)
CHLORIDE: 107 mmol/L (ref 98–107)
CREATININE: 1.02 mg/dL — ABNORMAL HIGH (ref 0.60–1.00)
EGFR CKD-EPI AA FEMALE: 69 mL/min/{1.73_m2} (ref >=60)
EGFR CKD-EPI NON-AA FEMALE: 60 mL/min/{1.73_m2} (ref >=60)
GLUCOSE RANDOM: 196 mg/dL — ABNORMAL HIGH (ref 70–179)
POTASSIUM: 5 mmol/L (ref 3.5–5.0)
PROTEIN TOTAL: 6.7 g/dL (ref 6.5–8.3)
SODIUM: 138 mmol/L (ref 135–145)

## 2019-05-02 LAB — BENZODIAZEPINE SCREEN, URINE: Lab: <200

## 2019-05-02 LAB — TOXICOLOGY SCREEN, URINE
BARBITURATE SCREEN URINE: <200
BENZODIAZEPINE SCREEN, URINE: <200
COCAINE(METAB.)SCREEN, URINE: <150
METHADONE SCREEN, URINE: <300

## 2019-05-02 LAB — TROPONIN I
Troponin I.cardiac:MCnc:Pt:Ser/Plas:Qn:: <0.034
Troponin I.cardiac:MCnc:Pt:Ser/Plas:Qn:: <0.034

## 2019-05-02 LAB — MAGNESIUM: Magnesium:MCnc:Pt:Ser/Plas:Qn:: 1.4 — ABNORMAL LOW

## 2019-05-02 LAB — EGFR CKD-EPI NON-AA FEMALE: Lab: 60

## 2019-05-02 LAB — LIPASE: Triacylglycerol lipase:CCnc:Pt:Ser/Plas:Qn:: 17 — ABNORMAL LOW

## 2019-05-02 LAB — SQUAMOUS EPITHELIAL: Lab: 4

## 2019-05-02 NOTE — Unmapped (Signed)
I received sign out from previous provider Dr. Orpah Clinton.     ED I-PASS Handoff  ?? Illness Severit: stable  ?? Patient Summary: Michele Cisneros is a 63 y.o. female with a past medical history of diabetes, hypertension, ESRD with recent kidney transplant (10/21), MI (2014), presented emergency department for acute onset chest pain, nausea and vomiting. Patient with HEART score of 6 warranting admission. CT A/P noncontrast unremarkable. Transplant surgery to evaluate patient  ?? Action List: Discuss with transplant surgery then likely medicine admission  ?? Situation Awareness (Contingency Planning): Anticipate admission.     Assessment & Plan:    5:37 AM: I spoke with the on-call surgery resident.  Transplant surgery will admit the patient to their service.  However, they are requesting a CT chest without contrast to rule out aortic dissection.  Order placed.    7:00 AM: Signed out patient to oncoming resident Dr. Earvin Hansen. Patient to be admitted to transplant surgery after CT chest results.

## 2019-05-02 NOTE — Unmapped (Signed)
Transplant Nephrology Consult Note    Assessment/Recommendations: [name] is a 63 y.o. female w/ PMH of ESRD s/pDBD kidney transplant on 03/30/19, HTN, DMT2, hx of MI in 2014 who presented to [facility] with significant abdominal pain, nausea, and significant vomiting. We have been consulted for renal transplantation status, immunosuppression.    Renal transplant, immunosuppression DBD Kidney transplant on 03/30/19  - continue home regimen of envarsus, myfortic.  - please check tacrolimus trough just prior to morning dose; we will monitor levels and suggest dose adjustments if needed.    - avoid nephrotoxins including NSAIDs and contrast agents  -Prophylaxis   - bactrim MWF   - Valcyte 450 daily    HTN:   - amlodipine 10 mg daily  - carvedilol 37.5 mg PO BID  - clonidine 0.2 mg PO Q8h  - Lasix    Abdominal pain/vomiting/nausea:  - the urine tox is positive for cannabinoid so cannabis hyperemesis syndrome is a possibility.  - cont Pantoprazole  - further work up and management per primary    Thank you for this consult.  Nephrology will continue to follow along with the primary team.  Please page the renal fellow on call with questions/concerns.    Patient was seen and evaluated by Dr. Carlene Coria who agrees with this assessment and plan.     Requesting Attending Physician :  Erlinda Hong,*  Service Requesting Consult : Emergency Medicine    Reason for Consult: renal transplant status, immunosuppression    Transplant history:  Date of transplant: 03/30/19  Transplant Nephrologist/Coordinator: Philis Nettle  Current immunosuppressants: tacrolimus and mycophenolate    HISTORY OF PRESENT ILLNESS: [name] is a 63 y.o. female w/ PMH of ESRD s/p DBD kidney transplant on 03/30/19, HTN, DMT2, hx of MI in 2014 who presented to [facility] with chest pain, nausea and vomiting. She presented to the ED with a 1 day history of burning chest pain associated with nausea and large number of emesis (up to 17 times). She reports that the pain was similar to reflux. Exam revealed exquisite and diffuse abdominal pain, mostly in epigastric and RUQ.  The ED physicians were concerned for aortic dissection and eventually obtained a CT C/A/P without contrast that were unremarkable.  Troponins were negative. She reports not having a BM for 3 days.  Although her food intake has decreased, her fluid intake has been good.    She denies fever, SOB, excessive sweating, dysuria, hematuria, melena or hematochezia.    She was given GI cocktail and pantoprazole with improvement in her symptoms    Medications:     Current Facility-Administered Medications:   ???  mycophenolate (MYFORTIC) EC tablet 540 mg, 540 mg, Oral, BID, Scot Jun, MD, 540 mg at 05/02/19 1204  ???  tacrolimus (ENVARSUS XR) extended release tablet 11 mg, 11 mg, Oral, Daily, Scot Jun, MD, 11 mg at 05/02/19 1047    Current Outpatient Medications:   ???  donepeziL (ARICEPT) 5 MG tablet, Take 5 mg by mouth nightly., Disp: , Rfl:   ???  furosemide (LASIX) 80 MG tablet, Take 80 mg by mouth Every Tuesday, Thursday, Saturday, Sunday. (non-dialysis days), Disp: , Rfl:   ???  isosorbide mononitrate (IMDUR) 60 MG 24 hr tablet, Take 60 mg by mouth daily., Disp: , Rfl:   ???  acetaminophen (TYLENOL) 500 MG tablet, Take 1-2 tablets (500-1,000 mg total) by mouth every six (6) hours as needed for pain or fever (> 38C or 100.51F).,  Disp: 30 tablet, Rfl: 0  ???  albuterol (PROVENTIL HFA;VENTOLIN HFA) 90 mcg/actuation inhaler, Inhale 2 puffs every six (6) hours as needed for wheezing., Disp: , Rfl:   ???  amLODIPine (NORVASC) 10 MG tablet, Take 1 tablet (10 mg total) by mouth daily., Disp: 30 tablet, Rfl: 11  ???  aspirin (ECOTRIN) 81 MG tablet, Take 81 mg by mouth daily., Disp: , Rfl: ???  atorvastatin (LIPITOR) 40 MG tablet, Take 1 tablet (40 mg total) by mouth daily., Disp: 30 tablet, Rfl: 11  ???  blood sugar diagnostic (CONTOUR TEST STRIPS) Strp, by Other route two (2) times a day. TEST BLOOD SUGARS 2 TIMES DAILY, Disp: 60 strip, Rfl: 5  ???  blood-glucose meter (ONETOUCH ULTRA SYSTEM KIT) kit, Please dispense for patient to use at home to check blood sugars., Disp: , Rfl:   ???  carvediloL (COREG) 12.5 MG tablet, Take 3 tablets (37.5 mg total) by mouth Two (2) times a day., Disp: 180 tablet, Rfl: 11  ???  cloNIDine HCL (CATAPRES) 0.1 MG tablet, Take 2 tablets (0.2 mg total) by mouth every eight (8) hours., Disp: 180 tablet, Rfl: 11  ???  docusate sodium (COLACE) 100 MG capsule, Take 1 capsule (100 mg total) by mouth two (2) times a day as needed for constipation., Disp: 60 capsule, Rfl: 0  ???  LANCETS,THIN MISC, Using at home to test blood sugars twice daily., Disp: , Rfl:   ???  magnesium oxide-Mg AA chelate (MAGNESIUM, AMINO ACID CHELATE,) 133 mg Tab, Take 1 tablet by mouth Two (2) times a day. HOLD until directed to start by your coordinator., Disp: 100 tablet, Rfl: 11  ???  metFORMIN (GLUCOPHAGE XR) 500 MG 24 hr tablet, Take 1 tablet (500 mg total) by mouth daily., Disp: 30 tablet, Rfl: 11  ???  mycophenolate (MYFORTIC) 180 MG EC tablet, Take 3 tablets (540 mg total) by mouth Two (2) times a day., Disp: 180 tablet, Rfl: 11  ???  nitroglycerin (NITROSTAT) 0.4 MG SL tablet, Place 0.4 mg under the tongue daily as needed., Disp: , Rfl:   ???  oxybutynin (DITROPAN) 5 MG tablet, Take 1 tablet (5 mg total) by mouth Three (3) times a day for 14 days., Disp: 42 tablet, Rfl: 0  ???  oxyCODONE (ROXICODONE) 5 MG immediate release tablet, Take 1-2 tablets (5-10 mg total) by mouth every six (6) hours as needed for pain., Disp: 40 tablet, Rfl: 0  ???  pantoprazole (PROTONIX) 40 MG tablet, Take 1 tablet (40 mg total) by mouth Two (2) times a day., Disp: 60 tablet, Rfl: 11 ???  polyethylene glycol (GLYCOLAX) 17 gram/dose powder, Mix 1 capful (17 g) in 4 to 8 ounces water, juice, soda, coffee, or tea and drink daily as needed., Disp: 510 g, Rfl: 0  ???  pregabalin (LYRICA) 75 MG capsule, Take 1 capsule (75 mg total) by mouth Two (2) times a day., Disp: 60 capsule, Rfl: 3  ???  senna (SENOKOT) 8.6 mg tablet, Take 1 tablet by mouth nightly as needed for constipation., Disp: 30 tablet, Rfl: 11  ???  sulfamethoxazole-trimethoprim (BACTRIM) 400-80 mg per tablet, Take 1 tablet (80 mg of trimethoprim total) by mouth Every Monday, Wednesday, and Friday., Disp: 12 tablet, Rfl: 5  ???  tacrolimus (ENVARSUS XR) 1 mg Tb24 extended release tablet, Take 3 tablets (3mg ) by mouth along with two 4mg  tablets daily. Total daily dose = 11mg ., Disp: 90 tablet, Rfl: 11  ???  tacrolimus (ENVARSUS XR) 4 mg Tb24 extended release  tablet, Take 2 tablets (8 mg) by mouth with 3 (1mg ) tablets daily. Total daily dose = 11mg ., Disp: 60 tablet, Rfl: 11  ???  valGANciclovir (VALCYTE) 450 mg tablet, Take 1 tablet (450 mg total) by mouth daily., Disp: 30 tablet, Rfl: 2     ALLERGIES  Hydrocodone and Lisinopril    MEDICAL HISTORY  Past Medical History:   Diagnosis Date   ??? End stage renal disease (CMS-HCC) 11/08/2012   ??? ESRD on hemodialysis (CMS-HCC) 08/21/2011    Pt gets dialysis with [facility] in [place], [place], on a TTS schedule.  She started dialysis in Feb 2014.  Etiology of renal failure not known, likely diabetes.  Has a left upper arm AV graft as of Dec 2014.   On transplant list since 2015.  Dr. Cherylann Ratel RN Annabelle Harman [phone]  Last Assessment & Plan:  Stable. continues to follow with lateef. On kidney transplant list.   ??? Other and unspecified hyperlipidemia 11/08/2012   ??? Type II or unspecified type diabetes mellitus 11/08/2012   ??? Unspecified essential hypertension 11/08/2012        SOCIAL HISTORY  Social History     Socioeconomic History   ??? Marital status: Widowed     Spouse name: None   ??? Number of children: None ??? Years of education: None   ??? Highest education level: None   Occupational History   ??? None   Social Needs   ??? Financial resource strain: None   ??? Food insecurity     Worry: None     Inability: None   ??? Transportation needs     Medical: None     Non-medical: None   Tobacco Use   ??? Smoking status: Former Smoker     Packs/day: 0.25     Years: 30.00     Pack years: 7.50     Types: Cigarettes     Quit date: 05/09/2013     Years since quitting: 5.9   ??? Smokeless tobacco: Never Used   ??? Tobacco comment: 1 pack every week; she reports historical periods of abstinence   Substance and Sexual Activity   ??? Alcohol use: Yes     Comment: very rare use (reports once over last year)   ??? Drug use: No   ??? Sexual activity: None   Lifestyle   ??? Physical activity     Days per week: None     Minutes per session: None   ??? Stress: None   Relationships   ??? Social Wellsite geologist on phone: None     Gets together: None     Attends religious service: None     Active member of club or organization: None     Attends meetings of clubs or organizations: None     Relationship status: None   Other Topics Concern   ??? None   Social History Narrative   ??? None        FAMILY HISTORY  Family History   Problem Relation Age of Onset   ??? Heart disease Mother    ??? Diabetes Mother    ??? Cancer Father    ??? Colon cancer Father 57   ??? Colon cancer Sister 4   ??? Breast cancer Sister         Code Status:  Full Code    Review of Systems:  A 12 system review of systems was negative except as noted in HPI.  Physical Exam:  Vitals:    05/02/19 1304   BP: 177/61   Pulse: 86   Resp: 16   Temp: 37.2 ??C   SpO2: 100%     No intake/output data recorded.  No intake or output data in the 24 hours ending 05/02/19 1604    General: well-appearing, in no acute distress  HEENT: anicteric sclera  CV: RRR, no m/r/g, no edema  Pulm: CTAB, normal wob  GI: soft, non-tender including over graft site, non-distended  MSK: no visible joint swelling Skin: no visible lesions or rashes  Psych: alert, engaged, appropriate mood and affect    LABS    Creatinine Trend  Lab Results   Component Value Date    Creatinine 1.02 (H) 05/01/2019    Creatinine 1.31 (H) 04/29/2019    Creatinine 1.19 (H) 04/27/2019    Creatinine 1.15 (H) 04/25/2019    Creatinine 1.28 (H) 04/22/2019    Creatinine 1.21 (H) 04/20/2019    Creatinine 1.35 (H) 04/18/2019    Creatinine 1.46 (H) 04/11/2019    Creatinine 2.25 (H) 04/08/2019    Creatinine 3.51 (H) 04/06/2019    Creatinine 5.10 (H) 04/05/2019    Creatinine 6.33 (H) 04/04/2019    Creatinine 6.76 (H) 04/03/2019    Creatinine 7.50 (H) 04/02/2019    Creatinine 7.82 (H) 04/01/2019    Creatinine 7.68 (H) 03/31/2019    Creatinine 7.57 (H) 03/30/2019    Creatinine 7.54 (H) 03/30/2019    Creatinine 11.70 (H) 03/28/2019    Creatinine 8.14 (H) 07/07/2017    Creatinine 7.79 (H) 04/24/2016    Creatinine 8.57 (H) 04/20/2014    Creatinine 3.79 (H) 09/15/2012        Lab Results   Component Value Date    Color, UA Light Yellow 05/02/2019    Color, UA Yellow 04/20/2019    Specific Gravity, UA 1.013 05/02/2019    Specific Gravity, UA 1.009 04/20/2019    pH, UA 5.0 05/02/2019    pH, UA 6.0 04/20/2019    Protein, UA Negative 05/02/2019    Protein, UA Negative 04/20/2019    Glucose, UA 150 mg/dL (A) 29/56/2130    Glucose, UA Negative 04/20/2019    Ketones, UA 80 mg/dL (A) 86/57/8469    Ketones, UA Negative 04/20/2019    Blood, UA Small (A) 05/02/2019    Blood, UA 1+ (A) 04/20/2019    Nitrite, UA Negative 05/02/2019    Nitrite, UA Negative 04/20/2019    Leukocyte Esterase, UA Trace (A) 05/02/2019    Leukocyte Esterase, UA Trace (A) 04/20/2019    Urobilinogen, UA 0.2 mg/dL 62/95/2841    Urobilinogen, UA 0.2 04/20/2019    Bilirubin, UA Negative 05/02/2019    Bilirubin, UA Negative 04/20/2019    WBC, UA 0-5 04/20/2019    RBC, UA 3-10 (A) 04/20/2019       Lab Results   Component Value Date    Creatinine, Ur 112.6 04/08/2019    Creat U 106.0 04/25/2019 Protein/Creatinine Ratio, Urine 0.165 04/25/2019    Protein/Creat Ratio 472 (H) 04/08/2019        No results found for: NAPA, NAUR     Lab Results   Component Value Date    NA 138 05/01/2019    K 5.0 05/01/2019    CL 107 05/01/2019    CO2 16.0 (L) 05/01/2019    BUN 13 05/01/2019    CREATININE 1.02 (H) 05/01/2019      Lab Results   Component Value Date    CALCIUM 10.6 (H)  05/01/2019    MG 1.4 (L) 05/01/2019    PHOS 3.7 04/25/2019    ALBUMIN 4.1 05/01/2019      Lab Results   Component Value Date    WBC 11.1 (H) 05/01/2019    HGB 10.2 (L) 05/01/2019    HCT 31.4 (L) 05/01/2019    PLT 224 05/01/2019        IMAGING STUDIES:  Ecg 12 Lead    Result Date: 05/02/2019  NORMAL SINUS RHYTHM VOLTAGE CRITERIA FOR LEFT VENTRICULAR HYPERTROPHY T WAVE ABNORMALITY, CONSIDER LATERAL ISCHEMIA ABNORMAL ECG WHEN COMPARED WITH ECG OF 01-May-2019 23:01, NO SIGNIFICANT CHANGE WAS FOUND Confirmed by Gus Rankin ([id number]) on 05/02/2019 11:20:07 AM    Ecg 12 Lead    Result Date: 05/02/2019  NORMAL SINUS RHYTHM POSSIBLE LEFT ATRIAL ENLARGEMENT LEFT VENTRICULAR HYPERTROPHY NONSPECIFIC T WAVE ABNORMALITY ABNORMAL ECG WHEN COMPARED WITH ECG OF 01-May-2019 22:38, NO SIGNIFICANT CHANGE WAS FOUND Confirmed by Gus Rankin ([id number]) on 05/02/2019 11:19:42 AM    Ecg 12 Lead    Result Date: 05/02/2019  NORMAL SINUS RHYTHM POSSIBLE LEFT ATRIAL ENLARGEMENT LEFT VENTRICULAR HYPERTROPHY T WAVE ABNORMALITY, CONSIDER LATERAL ISCHEMIA ABNORMAL ECG WHEN COMPARED WITH ECG OF 03-Apr-2019 04:11, NONSPECIFIC T WAVE ABNORMALITY NOW EVIDENT IN INFERIOR LEADS T WAVE INVERSION NOW EVIDENT IN LATERAL LEADS Confirmed by Gus Rankin ([id number]) on 05/02/2019 11:19:07 AM    Ecg 12 Lead    Result Date: 04/03/2019  NORMAL SINUS RHYTHM MINIMAL VOLTAGE CRITERIA FOR LVH, MAY BE NORMAL VARIANT WHEN COMPARED WITH ECG OF 30-Mar-2019 13:55, NONSPECIFIC AXIS SHIFT TO RIGHT OTHERWISE NO SIGNIFICANT CHANGE WAS FOUND Confirmed by Schuyler Amor ([id number]) on 04/03/2019 10:16:06 AM Ct Abdomen Pelvis Wo Contrast    Result Date: 05/02/2019  EXAM: CT ABDOMEN PELVIS WO CONTRAST DATE: 05/02/2019 3:33 AM ACCESSION: [id number] UN DICTATED: 05/02/2019 3:29 AM INTERPRETATION LOCATION: [facility]     CLINICAL INDICATION: s/p renal transplant with abdominal pain ; Abdominal pain, acute, nonlocalized      COMPARISON: CT abdomen/pelvis 03/29/2019.     TECHNIQUE: A spiral CT scan of the abdomen and pelvis was obtained without IV contrast from the lung bases through the pubic symphysis. Images were reconstructed in the axial plane. Coronal and sagittal reformatted images were also provided for further evaluation.     FINDINGS:     Evaluation of the solid organs and vasculature is limited in the absence of intravenous contrast.     LINES AND TUBES: None.     LOWER THORAX: Annular calcifications.     HEPATOBILIARY: No focal hepatic lesions. The gallbladder is distended to the upper limits of normal. No biliary dilatation.  SPLEEN: Unremarkable. PANCREAS: Unremarkable.     ADRENALS: Unremarkable. KIDNEYS/URETERS: Bilateral cortical atrophy and lobular contour consistent with chronic medical renal disease. No hydronephrosis or hydroureter. No radiopaque calculi. Unchanged 1.2 cm hypodensity in the right kidney lower pole.     Sequelae of left lower quadrant renal transplant. Double-J ureteral stent with proximal portion in the transplanted renal pelvis and distal portion within the bladder. No significant perinephric fluid collection. Mild nonspecific perinephric inflammatory stranding.     BLADDER: Unremarkable. PELVIC/REPRODUCTIVE ORGANS: Surgically absent uterus.     GI TRACT: Small hiatal hernia. No dilated or thick walled loops of bowel. Colonic diverticulosis without evidence of diverticulitis. PERITONEUM/RETROPERITONEUM AND MESENTERY: No free air or fluid. LYMPH NODES: No enlarged lymph nodes. VESSELS: Normal in caliber. Severe calcified atherosclerotic disease of the abdominal aorta and its branch vessels.     BONES AND SOFT TISSUES: Surgical  staples and surgical scar lower quadrant soft tissue. Multilevel degenerative disease of the spine. No aggressive osseous lesions.             Sequelae of left lower quadrant renal transplant without significant perinephric fluid collection identified.     No evidence of acute intra-abdominal aortic pathology as clinically questioned.     Gallbladder is hydropic. No other evidence to suggest acute cholecystitis.     ==================== ADDENDUM (05/02/2019 7:24 AM): On review, the following additional findings were noted:         1. Thickening of the adrenal glands bilaterally without discrete nodule, concerning for adrenal hyperplasia.    Xr Chest Portable    Result Date: 05/02/2019  EXAM: XR CHEST PORTABLE DATE: 05/01/2019 11:06 PM ACCESSION: [id number] UN DICTATED: 05/01/2019 11:06 PM INTERPRETATION LOCATION: [facility]     CLINICAL INDICATION: 63 years old Female with CHEST PAIN      COMPARISON: Chest radiograph 04/04/2019     TECHNIQUE: Portable Chest Radiograph.     FINDINGS: Vascular stent overlies the left lung apex, unchanged.     Radiographically clear lungs. No focal consolidation.     No demonstrable pleural effusion or pneumothorax.     Stable cardiomediastinal silhouette.         No acute airspace disease.    Xr Chest Portable    Result Date: 04/04/2019  EXAM: XR CHEST PORTABLE DATE: 04/04/2019 3:11 PM ACCESSION: [id number] UN DICTATED: 04/04/2019 5:00 PM INTERPRETATION LOCATION: [facility]     CLINICAL INDICATION: 63 years old Female with POSTSURGICAL STATUS      COMPARISON: 04/03/2019.     TECHNIQUE: Portable Chest Radiograph.     FINDINGS:     Vascular stent overlying the left lung apex. Interval improved aeration of bilateral lungs. No focal consolidation. Persistent mild prominent of the pulmonary interstitium.     No pleural effusion or pneumothorax     Stable cardiomediastinal silhouette.                 Interval improved aeration in the bilateral lungs with persistent mild interstitial edema.    Xr Chest Portable    Result Date: 04/03/2019  EXAM: XR CHEST PORTABLE DATE: 04/03/2019 5:08 AM ACCESSION: [id number] UN DICTATED: 04/03/2019 7:44 AM INTERPRETATION LOCATION: [facility]     CLINICAL INDICATION: 63 years old Female with SHORTNESS OF BREATH      COMPARISON: Previous day     TECHNIQUE: Portable upright     FINDINGS: The bilateral alveolar opacities due to edema and/or infectious infiltrate appear minimally increased. No pneumothorax or pleural fluid is seen. The rest of the study is stable.         Ct Chest Wo Contrast    Result Date: 05/02/2019  EXAM: CT CHEST WO CONTRAST DATE: 05/02/2019 7:59 AM ACCESSION: [id number] UN DICTATED: 05/02/2019 8:04 AM INTERPRETATION LOCATION: [facility]     CLINICAL INDICATION: 63 years old Female with r/o aortic dissection, can't receive contrast bc of renal transplant      COMPARISON: CT abdomen 07/07/2017.     TECHNIQUE: A helical CT scan was obtained without IV contrast from the thoracic inlet through the hemidiaphragms. Images were reconstructed in the axial plane.  Coronal and sagittal reformatted images of the chest were also provided for further evaluation of the lung parenchyma.     FINDINGS:     AIRWAYS, LUNGS, PLEURA: Clear central tracheobronchial tree.  No lung consolidation.  0.3 cm left lower lobe nodule (image 57, series 2) unchanged compared to 07/07/2017.  No pleural effusion.     MEDIASTINUM: Examination is nondiagnostic for the specific indication of aortic dissection. No thoracic aortic intramural hematoma. No displaced intimal calcifications. No mediastinal hematoma or fluid collection. Dilated left ventricle. Small pericardial effusion. No mediastinal lymphadenopathy. Left subclavian vascular stent.     IMAGED ABDOMEN: Atrophic kidneys.     SOFT TISSUES: Unremarkable.     BONES: Unremarkable.                 Examination is nondiagnostic for the specific indication of aortic dissection due to the lack of intravenous contrast.     No thoracic aortic intramural hematoma or displaced intimal calcifications.     No mediastinal hematoma.    Ultrasound Renal Transplant    Result Date: 04/28/2019  EXAM: US RENAL TRANSPLANT W DOPPLER DATE: 04/28/2019 12:00 PM ACCESSION: [id number] UN DICTATED: 04/28/2019 12:46 PM INTERPRETATION LOCATION: [facility]     CLINICAL INDICATION: 63 years old Female with kidney transplant  - Z94.0 - Kidney transplant recipient - D84.9 - Immunocompromised (CMS - HCC) - R93.429 - Abnormal ultrasound of kidney     COMPARISON: Renal transplant ultrasound dated 04/20/2019, CT abdomen pelvis dated 03/29/2019     TECHNIQUE:  Ultrasound views of the renal transplant were obtained using gray scale and color and spectral Doppler imaging. Views of the urinary bladder were obtained using gray scale and limited color Doppler imaging.     FINDINGS:     TRANSPLANTED KIDNEY: The renal transplant was located in the left lower quadrant. Normal size and echogenicity.  No solid masses or calculi. No perinephric collections identified. Upper pole dilatation of the collecting system on pre- and post-void imaging. VESSELS: - Perfusion: Using power Doppler, there was decreased perfusion seen in the upper pole of the renal transplant. The remaining portions of the renal parenchyma demonstrate normal perfusion. - Resistive indices in the renal transplant are decreased from prior imaging but remain mildly elevated. - Main renal artery/iliac artery: Patent - Main renal vein/iliac vein: Patent -Velocities in the main renal artery are normal, measuring around 1.5 m/s, previously measuring up to 3.9 m/s.     BLADDER: The bladder is well distended and grossly unremarkable. The postvoid bladder volume measured 299 mL.             -Resistive indices in the renal transplant are decreased from prior imaging but remain mildly elevated. -There is a relative decrease in color Doppler perfusion in the upper pole of the transplant kidney. Attention on follow-up. -Normal velocities in the main renal artery, previously elevated. -Large postvoid bladder residual with persistent dilatation of the upper pole collecting system                 Please see below for data measurements:         Renal Transplant: length 10.4 cm; width 6.7 cm; height 4.6 cm     Arcuate artery superior resistive index: 0.79 Arcuate artery mid resistive index: 0.81 Arcuate artery inferior resistive index: 0.86     Previous resistive indices range of arcuate arteries: 0.91-0.94     Segmental artery superior resistive index: 0.79 Segmental artery mid resistive index: 0.84 Segmental artery inferior resistive index: 0.85     Previous resistive indices range of segmental arteries: 0.90-0.93     Main renal artery hilum resistive index: 0.85 Main renal artery mid resistive index: 0.86 Main renal artery anastomosis resistive index: 0.82         Main renal vein: patent  Iliac artery: Patent   Iliac vein: Patent       Bladder volume prevoid: 680.4  mL Bladder volume postvoid: 299  mL         Ed Pocus Chest Bilateral    Result Date: 05/02/2019 Limited Cardiac Ultrasound (CPT 16109-60)     Indication: A focused ultrasound exam of the heart was performed to evaluate for pericardial effusion, tamponade, severe hypovolemia, or gross abnormalities of cardiac anatomy or function in this patient. The ultrasound was performed with the following indications, as noted in the H&P: Chest pain and Other indications as noted in the H&P     Identified structures: The pericardial sac, myocardium, and 4 chambers were identified using the following views: parasternal long axis, parasternal short axis and apical 4-chamber     Findings: Exam of the above structures revealed the following findings:      Pericardial effusion: Absent   Pericardial tamponade: N/A  Global LV function: Normal  Right ventricular size: Normal   Signs of RV strain: N/A  IVC: N/A      Other findings: MV is moderately thickened and calcified     Limitations: None.     Impression:     No sonographic evidence of significant cardiac dysfunction, No sonographic evidence of significant pericardial effusion and Normal RV     Other: n/a     Interpreted by: Sondra Barges Bonhomme     Quality Assurance  After review of the point-of-care ultrasound performed in this case I assess the overall image quality as: Image quality: Minimal criteria met for diagnosis, all structures imaged well and diagnosis easily supported  The accuracy of interpretation of images as presented reflects a true positive.     This study does  meet minimum criteria for credentialing and billing.     Robbie Lis, MD         Xr Abdomen 1 View    Result Date: 04/04/2019  EXAM: XR ABDOMEN 1 VIEW DATE: 04/04/2019 12:08 PM ACCESSION: [id number] UN DICTATED: 04/04/2019 1:12 PM INTERPRETATION LOCATION: [facility]     CLINICAL INDICATION: 63 years old Female with PARALYTIC ILEUS      COMPARISON: CT abdomen and pelvis 03/29/2019.     TECHNIQUE: Supine view of the abdomen. FINDINGS: Left ureteral stent visualized in the pelvis with tips overlying the expected region of the bladder and LLQ transplant kidney. Staples overlying the left lower quadrant. Surgical clips overlie the left pelvis.     Nonspecific bowel gas pattern throughout nondilated loops of small and large bowel.     Atherosclerotic calcification seen in the left upper quadrant and pelvis. Multiple pelvic phleboliths. No acute bony abnormalities.         No gaseous distention of bowel.     Left ureteral stent visualized in the pelvis with tips overlying the expected region of the bladder and LLQ transplant kidney.         US Renal Transplant W Native Kidneys    Result Date: 04/20/2019  EXAM: US RENAL TRANSPLANT W NATIVE KIDNEYS DATE: 04/20/2019 1:58 PM ACCESSION: [id number] UN DICTATED: 04/20/2019 1:58 PM INTERPRETATION LOCATION: [facility]     CLINICAL INDICATION: 63 years old Female with pain over transplanted kidney  - Z94.0 - Kidney replaced by transplant     COMPARISON: Previous renal transplant with Doppler on 03/30/2019, CT 03/29/2019     TECHNIQUE:  Ultrasound views of the renal transplant were obtained using gray scale and color and spectral Doppler imaging. Views of the native  kidneys and urinary bladder were obtained using gray scale and limited color Doppler imaging.     FINDINGS:     NATIVE KIDNEYS: Atrophic echogenic kidneys with lobulated contours.     TRANSPLANTED KIDNEY: The renal transplant was located in the left lower quadrant and appears mildly edematous with less distinct margins than on previous examination. Nephroureteral stent positioned within the distal renal pelvis and extending into the bladder. Normal size and echogenicity.  No solid masses or calculi. No perinephric collections identified. No hydronephrosis. Prior fluid collection at the inferior pole of the transplanted kidney has not well appreciated on this study. VESSELS: - Perfusion: Using power Doppler, normal perfusion was seen throughout the renal parenchyma. - Resistive indices in the renal transplant are increased compared with prior examination. - Main renal artery/iliac artery: Patent - Main renal vein/iliac vein: Patent     BLADDER: Partially distended. Ureteral jets not visualized. Nephroureteral stent layering across the posterior wall.         Small, bilateral echogenic native kidneys, consistent with chronic medical renal disease.     Resistive indices are globally increased from prior imaging in the renal transplant arteries. Mildly edematous appearance of transplant kidney, which may be seen in the setting of rejection.     Increased velocities in the main renal artery and its midportion, possibly artifactual. Attention on follow-up examinations.             Please see below for data measurements:         Renal Transplant: length 11.3 cm; width 5.6 cm; height 4.8 cm     Arcuate artery superior resistive index: 0.94 Arcuate artery mid resistive index: 0.93 Arcuate artery inferior resistive index: 0.91     Previous resistive indices range of arcuate arteries: 0.66-0.78     Segmental artery superior resistive index: 0.90 Segmental artery mid resistive index: 0.90 Segmental artery inferior resistive index: 0.93     Previous resistive indices range of segmental arteries: 0.74-0.78     Main renal artery hilum resistive index: 0.91 Main renal artery mid resistive index: 0.95 Main renal artery anastomosis resistive index: 0.97     Previous resistive indices range of main renal artery: 0.65-0.88     Main renal vein: patent     Iliac artery: Patent Iliac vein: Patent     Bladder volume prevoid: 120  mL     Native right kidney: 7.7 cm Native left kidney: 7.1 cm

## 2019-05-02 NOTE — Unmapped (Signed)
Transplant Surgery Consult Note    Requesting Attending Physician:  Rosalio Macadamia, MD  Service Requesting Consult:  Emergency Medicine  Service Providing Consult: Transplant Surgery  Consulting Attending: Dr. Doyne Keel    Assessment:  Michele Cisneros is a 63 y.o. female with a PMH of CAD, HTN, COPD, and ESRD secondary to T2DM s/p DDKT on 03/30/19 who presents to the [facility] ED on 05/02/19 with acute onset substernal, burning chest pain and associated nausea/vomiting that began Saturday morning. She arrived hypertensive with systolic blood pressures in the 180s but otherwise with normal vital signs. Labs notable for a mild leukocytosis of 11.1, Hg 10.2, K of 5.0, creatinine 1.02, negative trop, and BNP of 2820. UA negative for infection and surgical incision clean and dry with staples in place and no signs of infection including erythema, induration, or fluctuance.     PLAN:   - Plan to obtain stat CT chest non-contrast to evaluate for any concerning signs of aortic dissection given her symptomatology and persistent, severe substernal chest pain. If positive for signs of possible dissection or aortic pathology, will contact the appropriate Surgical and Medical teams. If negative for signs of dissection, will plan to admit to Transplant surgery for further work up including possible EGD.   - Please keep her NPO at this time and aggressively treat her nausea.  - No surgical intervention related to the kidney transplant indicated based on the CT scan showing normal sequelae of kidney transplant, normal urine output, and clean/dry incision with no signs of infection.     If you have any questions, concerns or changes in the patient's clinical status, please feel free to contact [facility] consult pager [phone]. Thank you for this interesting consult.    Gerrie Nordmann, MD  PGY-2 General Surgery Resident   Pager: [phone]     History of Present Illness:   Chief Complaint:  chest pain Michele Cisneros is a 63 y.o. female who is seen in consultation for chest pain at the request of Rosalio Macadamia, MD on the Emergency Medicine service.     Michele Cisneros is a 63 y.o. female with a PMH of CAD, HTN, COPD, and ESRD secondary to T2DM s/p DDKT on 03/30/19 who presents to the [facility] ED on 05/02/19 with acute onset chest pain and associated nausea/vomiting.    She states that her pain began on Saturday after eating her normal breakfast of eggs and breakfast sausage. Shortly after the pain started she began having emesis that was originally bilious in nature but then turned more brown in color. She estimates she has had approximately 12 episodes of emesis since the onset of her symptoms. She reports that her pain is burning and substernal in nature and feels consistent with her previous heartburn episodes although more severe. She denies any fevers, chills, hematemesis, or melena. She had previously been having diarrhea but stopped her bowel regimen after being seen in Transplant clinic on 11/16 and has not had a bowel movement in several days. She denies any urinary symptoms or changes in her urine output or color.       Past Medical History:   Past Medical History:   Diagnosis Date   ??? End stage renal disease (CMS-HCC) 11/08/2012   ??? ESRD on hemodialysis (CMS-HCC) 08/21/2011    Pt gets dialysis with [facility] in [address], [place], on a TTS schedule.  She started dialysis in Feb 2014.  Etiology of renal failure not known, likely diabetes.  Has a  left upper arm AV graft as of Dec 2014.   On transplant list since 2015.  Dr. Cherylann Ratel RN [facility] [phone]  Last Assessment & Plan:  Stable. continues to follow with lateef. On kidney transplant list.   ??? Other and unspecified hyperlipidemia 11/08/2012   ??? Type II or unspecified type diabetes mellitus 11/08/2012   ??? Unspecified essential hypertension 11/08/2012       Past Surgical History:  Past Surgical History:   Procedure Laterality Date   ??? AV FISTULA PLACEMENT ??? HYSTERECTOMY  2000   ??? PR TRANSPLANTATION OF KIDNEY Left 03/30/2019    Procedure: RENAL ALLOTRANSPLANTATION, IMPLANTATION OF GRAFT; WITHOUT RECIPIENT NEPHRECTOMY;  Surgeon: Loney Hering, MD;  Location: MAIN OR [place];  Service: Transplant   ??? TOTAL ABDOMINAL HYSTERECTOMY W/ BILATERAL SALPINGOOPHORECTOMY  2000       Medications:  No current facility-administered medications on file prior to encounter.      Current Outpatient Medications on File Prior to Encounter   Medication Sig Dispense Refill   ??? acetaminophen (TYLENOL) 500 MG tablet Take 1-2 tablets (500-1,000 mg total) by mouth every six (6) hours as needed for pain or fever (> 38C or 100.24F). 30 tablet 0   ??? albuterol (PROVENTIL HFA;VENTOLIN HFA) 90 mcg/actuation inhaler Inhale 2 puffs every six (6) hours as needed for wheezing.     ??? amLODIPine (NORVASC) 10 MG tablet Take 1 tablet (10 mg total) by mouth daily. 30 tablet 11   ??? aspirin (ECOTRIN) 81 MG tablet Take 81 mg by mouth daily.     ??? atorvastatin (LIPITOR) 40 MG tablet Take 1 tablet (40 mg total) by mouth daily. 30 tablet 11   ??? blood sugar diagnostic (CONTOUR TEST STRIPS) Strp by Other route two (2) times a day. TEST BLOOD SUGARS 2 TIMES DAILY 60 strip 5   ??? blood-glucose meter (ONETOUCH ULTRA SYSTEM KIT) kit Please dispense for patient to use at home to check blood sugars.     ??? carvediloL (COREG) 12.5 MG tablet Take 3 tablets (37.5 mg total) by mouth Two (2) times a day. 180 tablet 11   ??? cloNIDine HCL (CATAPRES) 0.1 MG tablet Take 2 tablets (0.2 mg total) by mouth every eight (8) hours. 180 tablet 11   ??? docusate sodium (COLACE) 100 MG capsule Take 1 capsule (100 mg total) by mouth two (2) times a day as needed for constipation. 60 capsule 0   ??? LANCETS,THIN MISC Using at home to test blood sugars twice daily.     ??? magnesium oxide-Mg AA chelate (MAGNESIUM, AMINO ACID CHELATE,) 133 mg Tab Take 1 tablet by mouth Two (2) times a day. HOLD until directed to start by your coordinator. 100 tablet 11 ??? metFORMIN (GLUCOPHAGE XR) 500 MG 24 hr tablet Take 1 tablet (500 mg total) by mouth daily. 30 tablet 11   ??? mycophenolate (MYFORTIC) 180 MG EC tablet Take 3 tablets (540 mg total) by mouth Two (2) times a day. 180 tablet 11   ??? nitroglycerin (NITROSTAT) 0.4 MG SL tablet Place 0.4 mg under the tongue daily as needed.     ??? oxybutynin (DITROPAN) 5 MG tablet Take 1 tablet (5 mg total) by mouth Three (3) times a day for 14 days. 42 tablet 0   ??? oxyCODONE (ROXICODONE) 5 MG immediate release tablet Take 1-2 tablets (5-10 mg total) by mouth every six (6) hours as needed for pain. 40 tablet 0   ??? pantoprazole (PROTONIX) 40 MG tablet Take 1 tablet (40  mg total) by mouth Two (2) times a day. 60 tablet 11   ??? polyethylene glycol (GLYCOLAX) 17 gram/dose powder Mix 1 capful (17 g) in 4 to 8 ounces water, juice, soda, coffee, or tea and drink daily as needed. 510 g 0   ??? pregabalin (LYRICA) 75 MG capsule Take 1 capsule (75 mg total) by mouth Two (2) times a day. 60 capsule 3   ??? senna (SENOKOT) 8.6 mg tablet Take 1 tablet by mouth nightly as needed for constipation. 30 tablet 11   ??? sulfamethoxazole-trimethoprim (BACTRIM) 400-80 mg per tablet Take 1 tablet (80 mg of trimethoprim total) by mouth Every Monday, Wednesday, and Friday. 12 tablet 5   ??? tacrolimus (ENVARSUS XR) 1 mg Tb24 extended release tablet Take 3 tablets (3mg ) by mouth along with two 4mg  tablets daily. Total daily dose = 11mg . 90 tablet 11   ??? tacrolimus (ENVARSUS XR) 4 mg Tb24 extended release tablet Take 2 tablets (8 mg) by mouth with 3 (1mg ) tablets daily. Total daily dose = 11mg . 60 tablet 11   ??? valGANciclovir (VALCYTE) 450 mg tablet Take 1 tablet (450 mg total) by mouth daily. 30 tablet 2       Allergies:  Allergies   Allergen Reactions   ??? Hydrocodone Hives   ??? Lisinopril      Unknown reaction        Family History:  Family History   Problem Relation Age of Onset   ??? Heart disease Mother    ??? Diabetes Mother    ??? Cancer Father    ??? Colon cancer Father 5 ??? Colon cancer Sister 43   ??? Breast cancer Sister        Social History:   Social History     Tobacco Use   ??? Smoking status: Former Smoker     Packs/day: 0.25     Years: 30.00     Pack years: 7.50     Types: Cigarettes     Quit date: 05/09/2013     Years since quitting: 5.9   ??? Smokeless tobacco: Never Used   ??? Tobacco comment: 1 pack every week; she reports historical periods of abstinence   Substance Use Topics   ??? Alcohol use: Yes     Comment: very rare use (reports once over last year)   ??? Drug use: No       Review of Systems  10 systems were reviewed and are negative except as noted specifically in the HPI.    Objective  Vitals:   Heart Rate:  [80-89] 85  SpO2 Pulse:  [80-89] 85  Resp:  [18-22] 19  BP: (161-199)/(52-64) 170/56  MAP (mmHg):  [83-103] 90  SpO2:  [99 %-100 %] 100 %      Intake/Output last 24 hours:  No intake or output data in the 24 hours ending 05/02/19 0543    Physical Exam:    General: Ill appearing, well-nourished, laying in bed in mild distress due to nausea and pain  Eyes: Sclera anicteric, EOM intact  ENT: Nares without discharge, moist mucous membranes, trachea midline   Cardiac: Regular rate and rhythm.   Pulmonary: Non labored breathing, stable on room air, equal chest rise bilaterally. No wheezing.  Abdomen: Soft, non-tender, non distended. LLQ incision clean/dry with staples in place and no signs of infection including erythema, induration, or fluctuance. JP drain site healing well without complication.  Extremities: Warm and well perfused, no edema bilaterally   Neuro: Alert and oriented x3  Pertinent Diagnostic Tests:  All lab results last 24 hours:    Recent Results (from the past 24 hour(s))   Blood Gas, Venous    Collection Time: 05/01/19 10:45 PM   Result Value Ref Range    Specimen Source Venous     FIO2 Venous Room Air     pH, Venous 7.40 7.32 - 7.43    pCO2, Ven 28 (L) 40 - 60 mm Hg    pO2, Ven 51 30 - 55 mm Hg    HCO3, Ven 17 (L) 22 - 27 mmol/L Base Excess, Ven -6.9 (L) -2.0 - 2.0    O2 Saturation, Venous 86.2 (H) 40.0 - 85.0 %   Lactic Acid, Venous, Whole Blood    Collection Time: 05/01/19 10:45 PM   Result Value Ref Range    Lactate, Venous 1.0 0.5 - 1.8 mmol/L   PT-INR    Collection Time: 05/01/19 10:45 PM   Result Value Ref Range    PT 13.1 10.5 - 13.5 sec    INR 1.11    APTT    Collection Time: 05/01/19 10:45 PM   Result Value Ref Range    APTT 36.8 25.3 - 37.1 sec    Heparin Correlation 0.2    CBC w/ Differential    Collection Time: 05/01/19 10:45 PM   Result Value Ref Range    WBC 11.1 (H) 4.5 - 11.0 10*9/L    RBC 3.14 (L) 4.00 - 5.20 10*12/L    HGB 10.2 (L) 12.0 - 16.0 g/dL    HCT 09.8 (L) 11.9 - 46.0 %    MCV 100.1 (H) 80.0 - 100.0 fL    MCH 32.6 26.0 - 34.0 pg    MCHC 32.6 31.0 - 37.0 g/dL    RDW 14.7 (H) 82.9 - 15.0 %    MPV 10.6 (H) 7.0 - 10.0 fL    Platelet 224 150 - 440 10*9/L    Variable HGB Concentration Slight (A) Not Present    Neutrophils % 95.9 %    Lymphocytes % 1.0 %    Monocytes % 2.2 %    Eosinophils % 0.4 %    Basophils % 0.1 %    Neutrophil Left Shift 1+ (A) Not Present    Absolute Neutrophils 10.6 (H) 2.0 - 7.5 10*9/L    Absolute Lymphocytes 0.1 (L) 1.5 - 5.0 10*9/L    Absolute Monocytes 0.2 0.2 - 0.8 10*9/L    Absolute Eosinophils 0.1 0.0 - 0.4 10*9/L    Absolute Basophils 0.0 0.0 - 0.1 10*9/L    Large Unstained Cells 0 0 - 4 %    Macrocytosis Moderate (A) Not Present    Anisocytosis Slight (A) Not Present    Hypochromasia Moderate (A) Not Present   Pro-BNP    Collection Time: 05/01/19 10:59 PM   Result Value Ref Range    PRO-BNP 2,820.0 (H) 0.0 - 226.0 pg/mL   Troponin I    Collection Time: 05/01/19 10:59 PM   Result Value Ref Range    Troponin I <0.034 <0.034 ng/mL   Magnesium Level    Collection Time: 05/01/19 10:59 PM   Result Value Ref Range    Magnesium 1.4 (L) 1.6 - 2.2 mg/dL   Comprehensive Metabolic Panel    Collection Time: 05/01/19 10:59 PM   Result Value Ref Range    Sodium 138 135 - 145 mmol/L Potassium 5.0 3.5 - 5.0 mmol/L    Chloride 107 98 - 107 mmol/L    Anion Gap  15 7 - 15 mmol/L    CO2 16.0 (L) 22.0 - 30.0 mmol/L    BUN 13 7 - 21 mg/dL    Creatinine 1.61 (H) 0.60 - 1.00 mg/dL    BUN/Creatinine Ratio 13     EGFR CKD-EPI Non-African American, Female 60 >=60 mL/min/1.31m2    EGFR CKD-EPI African American, Female 66 >=60 mL/min/1.59m2    Glucose 196 (H) 70 - 179 mg/dL    Calcium 09.6 (H) 8.5 - 10.2 mg/dL    Albumin 4.1 3.5 - 5.0 g/dL    Total Protein 6.7 6.5 - 8.3 g/dL    Total Bilirubin 0.8 0.0 - 1.2 mg/dL    AST 27 14 - 38 U/L    ALT 9 <35 U/L    Alkaline Phosphatase 109 38 - 126 U/L   Lipase Level    Collection Time: 05/01/19 10:59 PM   Result Value Ref Range    Lipase 17 (L) 44 - 232 U/L   ECG 12 Lead    Collection Time: 05/01/19 11:01 PM   Result Value Ref Range    EKG Systolic BP  mmHg    EKG Diastolic BP  mmHg    EKG Ventricular Rate 86 BPM    EKG Atrial Rate 86 BPM    EKG P-R Interval 150 ms    EKG QRS Duration 90 ms    EKG Q-T Interval 388 ms    EKG QTC Calculation 464 ms    EKG Calculated P Axis 41 degrees    EKG Calculated R Axis 22 degrees    EKG Calculated T Axis 104 degrees    QTC Fredericia 437 ms   Urinalysis    Collection Time: 05/02/19  2:39 AM   Result Value Ref Range    Color, UA Light Yellow     Clarity, UA Hazy     Specific Gravity, UA 1.013 1.003 - 1.030    pH, UA 5.0 5.0 - 9.0    Leukocyte Esterase, UA Trace (A) Negative    Nitrite, UA Negative Negative    Protein, UA Negative Negative    Glucose, UA 150 mg/dL (A) Negative    Ketones, UA 80 mg/dL (A) Negative    Urobilinogen, UA 0.2 mg/dL 0.2 mg/dL, 1.0 mg/dL    Bilirubin, UA Negative Negative    Blood, UA Small (A) Negative    RBC, UA 2 <=4 /HPF    WBC, UA 3 0 - 5 /HPF    Squam Epithel, UA 4 0 - 5 /HPF    Bacteria, UA Rare (A) None Seen /HPF    Mucus, UA Rare (A) None Seen /HPF   Toxicology screen, urine    Collection Time: 05/02/19  2:39 AM   Result Value Ref Range    Amphetamine Screen, Ur <500 ng/mL Not Applicable Barbiturate Screen, Ur <200 ng/mL Not Applicable    Benzodiazepine Screen, Urine <200 ng/mL Not Applicable    Cannabinoid Scrn, Ur =/>20 ng/mL (A) Not Applicable    Methadone Screen, Urine <300 ng/mL Not Applicable    Cocaine(Metab.)Screen, Urine <150 ng/mL Not Applicable    Opiate Scrn, Ur =/>300 ng/mL (A) Not Applicable   Troponin I    Collection Time: 05/02/19  3:29 AM   Result Value Ref Range    Troponin I <0.034 <0.034 ng/mL       Imaging:  Ecg 12 Lead    Result Date: 05/01/2019  NORMAL SINUS RHYTHM POSSIBLE LEFT ATRIAL ENLARGEMENT LEFT VENTRICULAR HYPERTROPHY NONSPECIFIC T WAVE ABNORMALITY ABNORMAL ECG  WHEN COMPARED WITH ECG OF 01-May-2019 22:38, NONSPECIFIC T WAVE ABNORMALITY NO LONGER EVIDENT IN INFERIOR LEADS    Ct Abdomen Pelvis Wo Contrast    Result Date: 05/02/2019 EXAM: CT ABDOMEN PELVIS WO CONTRAST DATE: 05/02/2019 3:33 AM ACCESSION: [id number] UN DICTATED: 05/02/2019 3:29 AM INTERPRETATION LOCATION: [facility] CLINICAL INDICATION: s/p renal transplant with abdominal pain ; Abdominal pain, acute, nonlocalized  COMPARISON: CT abdomen/pelvis 03/29/2019. TECHNIQUE: A spiral CT scan of the abdomen and pelvis was obtained without IV contrast from the lung bases through the pubic symphysis. Images were reconstructed in the axial plane. Coronal and sagittal reformatted images were also provided for further evaluation. FINDINGS: Evaluation of the solid organs and vasculature is limited in the absence of intravenous contrast. LINES AND TUBES: None. LOWER THORAX: Annular calcifications. HEPATOBILIARY: No focal hepatic lesions. The gallbladder is distended to the upper limits of normal. No biliary dilatation.  SPLEEN: Unremarkable. PANCREAS: Unremarkable. ADRENALS: Unremarkable. KIDNEYS/URETERS: Bilateral cortical atrophy and lobular contour consistent with chronic medical renal disease. No hydronephrosis or hydroureter. No radiopaque calculi. Unchanged 1.2 cm hypodensity in the right kidney lower pole. Sequelae of left lower quadrant renal transplant. Double-J ureteral stent with proximal portion in the transplanted renal pelvis and distal portion within the bladder. No significant perinephric fluid collection. Mild nonspecific perinephric inflammatory stranding. BLADDER: Unremarkable. PELVIC/REPRODUCTIVE ORGANS: Surgically absent uterus. GI TRACT: Small hiatal hernia. No dilated or thick walled loops of bowel. Colonic diverticulosis without evidence of diverticulitis. PERITONEUM/RETROPERITONEUM AND MESENTERY: No free air or fluid. LYMPH NODES: No enlarged lymph nodes. VESSELS: Normal in caliber. Severe calcified atherosclerotic disease of the abdominal aorta and its branch vessels. BONES AND SOFT TISSUES: Surgical staples and surgical scar lower quadrant soft tissue. Multilevel degenerative disease of the spine. No aggressive osseous lesions.     Sequelae of left lower quadrant renal transplant without significant perinephric fluid collection identified. No evidence of acute intra-abdominal aortic pathology as clinically questioned. Gallbladder is distended to the upper limits of normal. No other evidence to suggest acute cholecystitis.    Xr Chest Portable    Result Date: 05/01/2019  EXAM: XR CHEST PORTABLE DATE: 05/01/2019 11:06 PM ACCESSION: [id number] UN DICTATED: 05/01/2019 11:06 PM INTERPRETATION LOCATION: [facility] CLINICAL INDICATION: 63 years old Female with CHEST PAIN  COMPARISON: Chest radiograph 04/04/2019 TECHNIQUE: Portable Chest Radiograph. FINDINGS: Vascular stent overlies the left lung apex, unchanged. Radiographically clear lungs. No focal consolidation. No demonstrable pleural effusion or pneumothorax. Stable cardiac mediastinal silhouette.     No acute airspace disease.    Ed Pocus Chest Bilateral    Result Date: 05/01/2019 Limited Cardiac Ultrasound (CPT [id number]) Indication: A focused ultrasound exam of the heart was performed to evaluate for pericardial effusion, tamponade, severe hypovolemia, or gross abnormalities of cardiac anatomy or function in this patient. The ultrasound was performed with the following indications, as noted in the H&P: Chest pain and Other indications as noted in the H&P Identified structures: The pericardial sac, myocardium, and 4 chambers were identified using the following views: parasternal long axis, parasternal short axis and apical 4-chamber Findings: Exam of the above structures revealed the following findings:  Pericardial effusion: Absent   Pericardial tamponade: N/A  Global LV function: Normal  Right ventricular size: Normal   Signs of RV strain: N/A  IVC: N/A  Other findings: n/a Limitations: None. Impression: No sonographic evidence of significant cardiac dysfunction, No sonographic evidence of significant pericardial effusion and Normal RV Other: n/a Interpreted by: Sondra Barges Bonhomme

## 2019-05-02 NOTE — Unmapped (Signed)
Pt here with CP 9/10  kidney transplant pt  Recent abd surg - healing wound staples in place  EKG ongoing  On Cardiac monitor Hx of MI

## 2019-05-02 NOTE — Unmapped (Addendum)
[name] is a 63 yo F who DMT2, HTN who received a DDKT on 03/30/19 who presents to [facility] ED with chest pain.     Based on the ED physician's evaluation, there is a mild to moderate suspicion of aortic dissection.  Nephrology was called regarding obtaining CT contrast.  Cardiovascular Surgery is being consulted per ED.   After discussing with the team, a CT without contrast will be the initial imaging modality. If it comes back concerning for aortic dissection or inconclusive but the suspicion is high, then we have agreed that obtaining CT with contrast to evaluate this life threatening condition is indicated.    If she get's contrasted study, recommend volume expansion with NS or LR before and after if it is not contraindicated.     Will follow along.

## 2019-05-02 NOTE — Unmapped (Signed)
Tacrolimus Therapeutic Monitoring Pharmacy Note    Michele Cisneros is a 63 y.o. female continuing tacrolimus (Envarsus XR).     Indication: Kidney transplant     Date of Transplant: 03/30/19      Prior Dosing Information: Home regimen tacrolimus (Envarsus XR) 11 mg once daily - per 04/25/19 transplant visit    Goals:  Therapeutic Drug Levels  Tacrolimus trough goal: 8-10 ng/mL - per 04/25/19 transplant visit    Additional Clinical Monitoring/Outcomes  ?? Monitor renal function (SCr and urine output) and liver function (LFTs)  ?? Monitor for signs/symptoms of adverse events (e.g., hyperglycemia, hyperkalemia, hypomagnesemia, hypertension, headache, tremor)    Results:   Tacrolimus level: Not applicable    Pharmacokinetic Considerations and Significant Drug Interactions:  ? Concurrent hepatotoxic medications: None identified  ? Concurrent CYP3A4 substrates/inhibitors: None identified  ? Concurrent nephrotoxic medications: None identified    Assessment/Plan:  Recommendedation(s)  ? Continue home regimen of tacrolimus (Envarsus XR) 11 mg once daily    Follow-up  ? Next level to be determined by primary team.   ? A pharmacist will continue to monitor and recommend levels as appropriate    Please page service pharmacist with questions/clarifications.    [name], PharmD

## 2019-05-02 NOTE — Unmapped (Signed)
Internal Medicine ([facility]) History & Physical    Assessment & Plan:   Michele Cisneros is a 63 y.o. female with PMHx of CAD, HTN, COPD, and ESRD secondary to T2DM s/p DDKT on 03/30/19 who presented to [facility] with nausea, vomiting, and substernal chest pain.     Principal Problem:    GERD (gastroesophageal reflux disease)  Active Problems:    Status post deceased-donor kidney transplantation    Essential hypertension    COPD (chronic obstructive pulmonary disease) (CMS-HCC)    Coronary artery disease of native artery of native heart with stable angina pectoris (CMS-HCC)  Resolved Problems:    * No resolved hospital problems. *    Nausea - Vomiting: Onset after patient began experiencing severe burning mid-epigastric/substernal pain. ACS rule out in ED with CT non-contrast nondiagnostic of aortic dissection. Troponins negative. Symptoms seem to be associated with GERD; received IV pantoprazole and GI cocktail in ED with some relief of symptoms. However, given recent transplant and risk of infection on immunosuppressants, will work up possible infectious etiology. RUQ tenderness suggests possible abdominal source. Evaluated by Transplant Surgery, no role for intervention at this time.  - Blood cultures x 2  - RUQ ultrasound to eval for source of pain  - Continue fluids LR 100 ml/hr   - Advance diet as tolerated  - Pantoprazole 40mg  IV  - GI cocktail prn  - Famotidine prn  - Acetaminophen for pain  - Home pregabalin    ESRD s/p renal transplant: ESRD secondary to DM, s/p kidney transplant on 10/21. Followed by [facility]. No issues with transplanted kidney. Transplant meds include tacrolimus and Myfortic. Tac level on admission 8.3. Nephrology consulted on admission.  - Pharmacy consult for tacrolimus dosing  - Continue tacrolimus and Myfortic  - Continue Lasix home dose  - Valtrex and Bactrim prophylaxis  - Avoid nephrotoxins and renally dose medications    Chronic medical problems:  Hypertension: Noted to be hypertensive on last clinic note. Continue amlodipine, clonidine  COPD: Duonebs prn  CAD: Continue carvedilol  DM: SSI    Daily Checklist:  Diet: Advance as tolerated  DVT PPx: Heparin 5000units q8h  Electrolytes: Replete Potassium to >/= 3.6 and Magnesium to >/= 1.8  Code Status: Full Code  Dispo: Admit to Med L    Chief Concern:   GERD (gastroesophageal reflux disease)    Subjective:   HPI:  Michele Cisneros is a 63 y.o. female with PMHx of CAD, HTN, COPD, and ESRD secondary to T2DM s/p DDKT on 03/30/19 who presented to [facility] with nausea, vomiting, and substernal chest pain.     Patient reports experiencing progressively worsening heartburn on Sunday which resulted in 11 episodes of non-bloody emesis and intractable nausea. She also reports severe mid-chest/epigastric pain that is burning in nature. She reports that her GERD has been worsening in severity of pain, but not to the point of nausea and vomiting. She has not had any difficulties swallowing or sensation of food being stuck in her throat. Per chart review, her pantoprazole had recently been increased to BID dosing.   She does endorse having chills in her room in the ED, but has not been around any sick contacts. She has not had shortness of breath, cough, diarrhea, urinary frequency or burning. She has not had any pain around the site of her transplanted kidney. She continues to make urine and has not noted any hematuria. Over the past two days, she has had very little to eat or drink.  On arrival to the ED, she was hypertensive with vitals otherwise stable. She has been afebrile. Labs on admission notable for a leukocytosis to 11.1 and BNP of 2820 that has since increased to 3890. UA notable only for glucose and ketones; no evidence of infection. Because she was complaining of substernal pain and hypertensive, concern was raised for possible aortic dissection. Transplant Nephrology consulted for consideration of CT imaging with contrast, but suspicion of dissection overall low by Transplant Surgery who evaluated her, so CT non-contrast chosen. Imaging was nondiagnostic for aortic dissection, but no other acute pathology seen. CT abdomen/pelvis negative for pathology affecting transplanted kidney or biliary pathology. As there was no role for surgical intervention, decision was made to admit to medicine for supportive care and PO challenge given that patient would not be able to be seen in clinic this week should she feel worse.    Designated Healthcare Decision Maker:  Ms. Schnackenberg currently has decisional capacity for healthcare decision-making and is able to designate a surrogate healthcare decision maker. Ms. Propps designated healthcare decision maker(s) is/are Earle Gell (the patient's adult child) as denoted by stated patient preference.    Allergies:  Hydrocodone and Lisinopril    Medications:   Prior to Admission medications    Medication Dose, Route, Frequency   donepeziL (ARICEPT) 5 MG tablet 5 mg, Oral, Nightly   furosemide (LASIX) 80 MG tablet 80 mg, Oral, Tuesday, Thursday, Saturday, Sunday, (non-dialysis days)    isosorbide mononitrate (IMDUR) 60 MG 24 hr tablet 60 mg, Oral, Daily (standard)   acetaminophen (TYLENOL) 500 MG tablet Take 1-2 tablets (500-1,000 mg total) by mouth every six (6) hours as needed for pain or fever (> 38C or 100.37F).   albuterol (PROVENTIL HFA;VENTOLIN HFA) 90 mcg/actuation inhaler 2 puffs, Every 6 hours PRN   amLODIPine (NORVASC) 10 MG tablet 10 mg, Oral, Daily (standard)   aspirin (ECOTRIN) 81 MG tablet 81 mg, Oral, Daily (standard)   atorvastatin (LIPITOR) 40 MG tablet 40 mg, Oral, Daily (standard)   blood sugar diagnostic (CONTOUR TEST STRIPS) Strp Other, 2 times a day, TEST BLOOD SUGARS 2 TIMES DAILY   blood-glucose meter (ONETOUCH ULTRA SYSTEM KIT) kit Please dispense for patient to use at home to check blood sugars.   carvediloL (COREG) 12.5 MG tablet 37.5 mg, Oral, 2 times a day (standard)   cloNIDine HCL (CATAPRES) 0.1 MG tablet 0.2 mg, Oral, Every 8 hours   docusate sodium (COLACE) 100 MG capsule 100 mg, Oral, 2 times a day PRN   LANCETS,THIN MISC Using at home to test blood sugars twice daily.   magnesium oxide-Mg AA chelate (MAGNESIUM, AMINO ACID CHELATE,) 133 mg Tab 1 tablet, Oral, 2 times a day (standard), HOLD until directed to start by your coordinator.   metFORMIN (GLUCOPHAGE XR) 500 MG 24 hr tablet 500 mg, Oral, Daily   mycophenolate (MYFORTIC) 180 MG EC tablet 540 mg, Oral, 2 times a day (standard)   nitroglycerin (NITROSTAT) 0.4 MG SL tablet 0.4 mg, Sublingual, Daily PRN   oxybutynin (DITROPAN) 5 MG tablet 5 mg, Oral, 3 times a day (standard)   oxyCODONE (ROXICODONE) 5 MG immediate release tablet 5-10 mg, Oral, Every 6 hours PRN   pantoprazole (PROTONIX) 40 MG tablet 40 mg, Oral, 2 times a day (standard)   polyethylene glycol (GLYCOLAX) 17 gram/dose powder Mix 1 capful (17 g) in 4 to 8 ounces water, juice, soda, coffee, or tea and drink daily as needed.   pregabalin (LYRICA) 75 MG capsule 75 mg, Oral,  2 times a day (standard)   senna (SENOKOT) 8.6 mg tablet 1 tablet, Oral, Nightly PRN   sulfamethoxazole-trimethoprim (BACTRIM) 400-80 mg per tablet 1 tablet, Oral, Every Mon-Wed-Fri   tacrolimus (ENVARSUS XR) 1 mg Tb24 extended release tablet Take 3 tablets (3mg ) by mouth along with two 4mg  tablets daily. Total daily dose = 11mg .   tacrolimus (ENVARSUS XR) 4 mg Tb24 extended release tablet Take 2 tablets (8 mg) by mouth with 3 (1mg ) tablets daily. Total daily dose = 11mg .   valGANciclovir (VALCYTE) 450 mg tablet 450 mg, Oral, Daily (standard)       Medical History:  Past Medical History:   Diagnosis Date   ??? End stage renal disease (CMS-HCC) 11/08/2012   ??? ESRD on hemodialysis (CMS-HCC) 08/21/2011    Pt gets dialysis with [facility] in [address], [place], on a TTS schedule.  She started dialysis in Feb 2014.  Etiology of renal failure not known, likely diabetes.  Has a left upper arm AV graft as of Dec 2014.   On transplant list since 2015.  Dr. Cherylann Ratel RN [facility] [phone]  Last Assessment & Plan:  Stable. continues to follow with lateef. On kidney transplant list.   ??? Other and unspecified hyperlipidemia 11/08/2012   ??? Type II or unspecified type diabetes mellitus 11/08/2012   ??? Unspecified essential hypertension 11/08/2012       Surgical History:  Past Surgical History:   Procedure Laterality Date   ??? AV FISTULA PLACEMENT     ??? HYSTERECTOMY  2000   ??? PR TRANSPLANTATION OF KIDNEY Left 03/30/2019    Procedure: RENAL ALLOTRANSPLANTATION, IMPLANTATION OF GRAFT; WITHOUT RECIPIENT NEPHRECTOMY;  Surgeon: Loney Hering, MD;  Location: MAIN OR [facility];  Service: Transplant   ??? TOTAL ABDOMINAL HYSTERECTOMY W/ BILATERAL SALPINGOOPHORECTOMY  2000       Social History:  Social History     Socioeconomic History   ??? Marital status: Widowed     Spouse name: Not on file   ??? Number of children: Not on file   ??? Years of education: Not on file   ??? Highest education level: Not on file   Occupational History   ??? Not on file   Social Needs   ??? Financial resource strain: Not on file   ??? Food insecurity     Worry: Not on file     Inability: Not on file   ??? Transportation needs     Medical: Not on file     Non-medical: Not on file   Tobacco Use   ??? Smoking status: Former Smoker     Packs/day: 0.25     Years: 30.00     Pack years: 7.50     Types: Cigarettes     Quit date: 05/09/2013     Years since quitting: 5.9   ??? Smokeless tobacco: Never Used   ??? Tobacco comment: 1 pack every week; she reports historical periods of abstinence   Substance and Sexual Activity   ??? Alcohol use: Yes     Comment: very rare use (reports once over last year)   ??? Drug use: No   ??? Sexual activity: Not on file   Lifestyle   ??? Physical activity     Days per week: Not on file     Minutes per session: Not on file   ??? Stress: Not on file   Relationships   ??? Social Wellsite geologist on phone: Not on file  Gets together: Not on file     Attends religious service: Not on file     Active member of club or organization: Not on file     Attends meetings of clubs or organizations: Not on file     Relationship status: Not on file   Other Topics Concern   ??? Not on file   Social History Narrative   ??? Not on file        Family History:  Family History   Problem Relation Age of Onset   ??? Heart disease Mother    ??? Diabetes Mother    ??? Cancer Father    ??? Colon cancer Father 50   ??? Colon cancer Sister 47   ??? Breast cancer Sister        Review of Systems:  noted per HPI    Objective:   Physical Exam:  Temp:  [37.2 ??C] 37.2 ??C  Heart Rate:  [80-91] 86  SpO2 Pulse:  [80-92] 83  Resp:  [16-28] 16  BP: (150-199)/(43-64) 177/61  SpO2:  [99 %-100 %] 100 %    Gen: Appeared uncomfortable in bed but alert and conversing appropriately  HEENT: atraumatic, no exudates, no scleral icterus   Neck: without deformity   Heart: Regular rate, no rubs or murmurs  Lungs: CTAB, no crackles or wheezes, no use of accessory muscles  Abdomen: No HSM, nontender to light palpation but very tender on deep palpation  Extremities: no clubbing, cyanosis, or edema  Neuro: CN II-XI grossly intact, normal cerebellar function, no focal deficits.  Skin:  No rashes, lesions on clothed exam  Psych: Normal mood and affect.     Labs/Studies:  Labs and Studies from the last 24hrs per EMR and Reviewed    Imaging: Radiology studies were personally reviewed

## 2019-05-02 NOTE — Unmapped (Signed)
[facility] Provider Note      ED Course, Assessment and Plan     Initial Clinical Impression:    May 01, 2019 11:52 PM   Michele Cisneros is a 63 y.o. female presents to the ED with acute onset chest pain, nausea and vomiting. On exam, patient is hypertensive, with a normal heart rate, and a O2 sat 100% on room air.  Normal cardiopulmonary exam.  Normal neurologic exam.  Exam remarkable for nausea and active vomiting in her room.  Also has exquisite tenderness to palpation in epigastrium and right upper quadrant.  Story is highly suspicious for ACS/MI.  Patient has a Heart Score of 6 points, moderate Score (4-6 points), risk of MACE of 12-16.6%. Given her upper abdominal pain, back pain and hypertension also need to consider aortic dissection.  Low concern for PE, tamponade, pericardial effusion, pneumothorax or infectious process.  Also need to consider cholecystitis given the patient's nausea and vomiting.    BP 197/79  - Pulse 76  - Temp 36.6 ??C (97.9 ??F) (Oral)  - Resp 18  - Wt 69.5 kg (153 lb 4.8 oz)  - SpO2 96%  - BMI 24.01 kg/m??     Will obtain basic labs, BNP, lipase, magnesium, coags. Will treat patient with morphine and Zofran.    ED Course:    ED Course as of May 04 1057   Sun May 01, 2019   2352 No acute airspace disease.   XR Chest Portable   Mon May 02, 2019   0259 No sonographic evidence of significant cardiac dysfunction, No sonographic evidence of significant pericardial effusion and Normal RV   ED POCUS Chest Bilateral        _____________________________________________________________________    The case was discussed with attending physician who is in agreement with the above assessment and plan    Dictation software was used while making this note. Please excuse any errors made with dictation software.    Additional Medical Decision Making I have reviewed the vital signs and the nursing notes. Labs and radiology results that were available during my care of the patient were independently reviewed by me and considered in my medical decision making.     I independently visualized the EKG tracing if performed  I independently visualized the radiology images if performed  I reviewed the patient's prior medical records if available.  Additional history obtained from family if available    History     CHIEF COMPLAINT:   Chief Complaint   Patient presents with   ??? Chest Pain       HPI: Michele Cisneros is a 63 y.o. female with past medical history of diabetes, HTN, ESRD with recent kidney transplant 10/21, MI (2014), presents to the ED with acute onset chest pain, nausea and vomiting.  Patient states that her symptoms began yesterday with what felt like indigestion.  She had 6 episodes of nonbilious nonbloody emesis.  Today complaining of 9/10 chest pain nonradiating, nausea, 11 episodes of bilious emesis.  Emesis became brown later in the day.  She has never had these symptoms before, states that prior to her MI 2014 she did not feel like this, at that point was just shortness of breath.  No recent illnesses, no sick contacts and no recent travel.  Denies fevers or chills.  Epigastric abdominal pain.  No diarrhea, hematochezia or melena.  No urinary symptoms.      PAST MEDICAL HISTORY/PAST SURGICAL HISTORY:  Past Medical History:   Diagnosis Date   ??? End stage renal disease (CMS-HCC) 11/08/2012   ??? ESRD on hemodialysis (CMS-HCC) 08/21/2011    Pt gets dialysis with [facility] in [address], [place], on a TTS schedule.  She started dialysis in Feb 2014.  Etiology of renal failure not known, likely diabetes.  Has a left upper arm AV graft as of Dec 2014.   On transplant list since 2015.  Dr. Cherylann Ratel RN Annabelle Harman [phone]  Last Assessment & Plan:  Stable. continues to follow with [facility]ef. On kidney transplant list.   ??? Other and unspecified hyperlipidemia 11/08/2012 ??? Type II or unspecified type diabetes mellitus 11/08/2012   ??? Unspecified essential hypertension 11/08/2012       Past Surgical History:   Procedure Laterality Date   ??? AV FISTULA PLACEMENT     ??? HYSTERECTOMY  2000   ??? PR TRANSPLANTATION OF KIDNEY Left 03/30/2019    Procedure: RENAL ALLOTRANSPLANTATION, IMPLANTATION OF GRAFT; WITHOUT RECIPIENT NEPHRECTOMY;  Surgeon: Loney Hering, MD;  Location: MAIN OR [place];  Service: Transplant   ??? TOTAL ABDOMINAL HYSTERECTOMY W/ BILATERAL SALPINGOOPHORECTOMY  2000       MEDICATIONS:     Current Facility-Administered Medications:   ???  acetaminophen (TYLENOL) tablet 500 mg, 500 mg, Oral, Q6H PRN, Jen Mow, MD, 500 mg at 05/03/19 2225  ???  sucralfate (CARAFATE) oral suspension, 1 g, Oral, BID PRN, 1 g at 05/05/19 0255 **AND** lidocaine (XYLOCAINE) 2% viscous mucosal solution, 10 mL, Oral, BID PRN, 10 mL at 05/05/19 0255 **AND** aluminum-magnesium hydroxide-simethicone (MAALOX MAX) 80-80-8 mg/mL oral suspension, 30 mL, Oral, BID PRN, Bennye Alm, MD, 30 mL at 05/05/19 0254  ???  amLODIPine (NORVASC) tablet 10 mg, 10 mg, Oral, Daily, Jen Mow, MD, 10 mg at 05/05/19 0981  ???  aspirin chewable tablet 81 mg, 81 mg, Oral, Daily, Jen Mow, MD, 81 mg at 05/05/19 1914  ???  atorvastatin (LIPITOR) tablet 40 mg, 40 mg, Oral, Daily, Jen Mow, MD, 40 mg at 05/05/19 0817  ???  carvediloL (COREG) tablet 37.5 mg, 37.5 mg, Oral, BID, Jen Mow, MD, 37.5 mg at 05/05/19 7829  ???  cloNIDine HCL (CATAPRES) tablet 0.2 mg, 0.2 mg, Oral, Q8H, Jen Mow, MD, 0.2 mg at 05/05/19 0252  ???  dextrose 50 % in water (D50W) 50 % solution 12.5 g, 12.5 g, Intravenous, Q10 Min PRN, Bennye Alm, MD  ???  donepeziL (ARICEPT) tablet 5 mg, 5 mg, Oral, Nightly, Jen Mow, MD, 5 mg at 05/04/19 2012  ???  heparin (porcine) injection 5,000 Units, 5,000 Units, Subcutaneous, Q8H SCH, Jen Mow, MD, 5,000 Units at 05/05/19 5621 ???  insulin lispro (HumaLOG) injection 0-12 Units, 0-12 Units, Subcutaneous, ACHS, Bennye Alm, MD, 2 Units at 05/04/19 2158  ???  ipratropium-albuteroL (DUO-NEB) 0.5-2.5 mg/3 mL nebulizer solution 3 mL, 3 mL, Nebulization, Q6H PRN, Jen Mow, MD  ???  isosorbide mononitrate (IMDUR) 24 hr tablet 60 mg, 60 mg, Oral, Daily, Jen Mow, MD, 60 mg at 05/05/19 0811  ???  lactated Ringers infusion, 75 mL/hr, Intravenous, Continuous, Jen Mow, MD, Last Rate: 75 mL/hr at 05/05/19 0515, 75 mL/hr at 05/05/19 0515  ???  lactated Ringers infusion, 10 mL/hr, Intravenous, Continuous, Beverly Milch, MD, Last Rate: 10 mL/hr at 05/04/19 1258, 10 mL/hr at 05/04/19 1258  ???  mycophenolate (MYFORTIC) EC tablet 540 mg, 540 mg, Oral, BID, Jen Mow, MD, 540 mg at  05/05/19 0827  ???  ondansetron (ZOFRAN) injection 4 mg, 4 mg, Intravenous, Q8H PRN, Abelina Bachelor, MD, 4 mg at 05/05/19 1004  ???  oxyCODONE (ROXICODONE) immediate release tablet 5 mg, 5 mg, Oral, Q6H PRN, 5 mg at 05/03/19 2226 **OR** oxyCODONE (ROXICODONE) immediate release tablet 10 mg, 10 mg, Oral, Q6H PRN, Bennye Alm, MD, 10 mg at 05/05/19 0811  ???  pantoprazole (PROTONIX) EC tablet 40 mg, 40 mg, Oral, BID, Jen Mow, MD, 40 mg at 05/05/19 1610  ???  pregabalin (LYRICA) capsule 75 mg, 75 mg, Oral, BID, Jen Mow, MD, 75 mg at 05/05/19 9604  ???  senna (SENOKOT) tablet 1 tablet, 1 tablet, Oral, Nightly PRN, Jen Mow, MD  ???  sucralfate (CARAFATE) oral suspension, 1 g, Oral, Q6H SCH, Tiffany D Long, MD, 1 g at 05/05/19 5409  ???  sulfamethoxazole-trimethoprim (BACTRIM) 400-80 mg tablet 80 mg of trimethoprim, 1 tablet, Oral, Q MWF, Jen Mow, MD, 80 mg of trimethoprim at 05/04/19 8119  ???  tacrolimus (ENVARSUS XR) extended release tablet 11 mg, 11 mg, Oral, Daily, Jen Mow, MD, 11 mg at 05/05/19 0827  ???  valGANciclovir (VALCYTE) tablet 450 mg, 450 mg, Oral, Daily, Jen Mow, MD, 450 mg at 05/05/19 1478    ALLERGIES: Hydrocodone and Lisinopril    SOCIAL HISTORY:   Social History     Tobacco Use   ??? Smoking status: Former Smoker     Packs/day: 0.25     Years: 30.00     Pack years: 7.50     Types: Cigarettes     Quit date: 05/09/2013     Years since quitting: 5.9   ??? Smokeless tobacco: Never Used   ??? Tobacco comment: 1 pack every week; she reports historical periods of abstinence   Substance Use Topics   ??? Alcohol use: Not Currently     Comment: very rare use (reports once over last year)       FAMILY HISTORY:  Family History   Problem Relation Age of Onset   ??? Heart disease Mother    ??? Diabetes Mother    ??? Cancer Father    ??? Colon cancer Father 20   ??? Colon cancer Sister 67   ??? Breast cancer Sister           Review of Systems    A 10 point review of systems was performed and is negative other than positive elements noted in HPI   Constitutional: Negative for fever.  Eyes: Negative for visual changes.  ENT: Negative for sore throat.  Cardiovascular:  Positive for chest pain.  Respiratory: Negative for shortness of breath.  Gastrointestinal:  Positive for epigastric pain.  Positive nausea, vomiting.  No diarrhea.  Genitourinary: Negative for dysuria.  Musculoskeletal: Negative for back pain.  Skin: Negative for rash.  Neurological: Negative for headaches, focal weakness or numbness.    Physical Exam     VITAL SIGNS:    BP 197/79  - Pulse 76  - Temp 36.6 ??C (97.9 ??F) (Oral)  - Resp 18  - Wt 69.5 kg (153 lb 4.8 oz)  - SpO2 96%  - BMI 24.01 kg/m??     Constitutional: Alert and oriented. Well appearing and in no distress.  Eyes: Conjunctivae are normal.  ENT       Head: Normocephalic and atraumatic.       Nose: No congestion.       Mouth/Throat: Mucous membranes are moist.  Neck: No stridor.  Cardiovascular: Normal rate, regular rhythm.  S1-S2 within normal limits no murmurs rubs or gallops.  Respiratory: Normal respiratory effort.  Lungs clear to auscultation bilaterally. Gastrointestinal: Tender to palpation in epigastrium, voluntary guarding, no rebound.  No peritoneal signs.  Genitourinary: No suprapubic tenderness  Musculoskeletal: Normal range of motion in all extremities.   Neurologic: Normal speech and language. No gross focal neurologic deficits are appreciated.  Skin: Skin is warm, dry. No rash noted.  Psychiatric: Mood and affect are normal. Speech and behavior are normal.         Radiology     US Abdomen Complete   Final Result   -Prominent gallbladder without evidence of cholelithiasis or biliary ductal dilatation.  Negative sonographic Murphys sign.     -Atrophic, echogenic native kidneys consistent with chronic medical renal disease.        Please see below for data measurements:   Liver: 11.28 cm       Common hepatic duct: 0.28 cm   Proximal common bile duct: 0.57 cm   Distal common bile duct: non vis       Gallbladder wall:  0.22 cm    Sonographic Murphy's Sign: negative   Pericholecystic fluid visualized: no      Right kidney: 6.85 cm    Left kidney: 5.89 cm       Aorta: not well visualized   Inferior vena cava: not well visualized      Spleen: 8.01 cm       CT Chest Wo Contrast   Final Result      Examination is nondiagnostic for the specific indication of aortic dissection due to the lack of intravenous contrast.      No thoracic aortic intramural hematoma or displaced intimal calcifications.      No mediastinal hematoma.      CT Abdomen Pelvis Wo Contrast   Final Result   Sequelae of left lower quadrant renal transplant without significant perinephric fluid collection identified.      No evidence of acute intra-abdominal aortic pathology as clinically questioned.      Gallbladder is hydropic. No other evidence to suggest acute cholecystitis.      ====================   ADDENDUM (05/02/2019 7:24 AM):    On review, the following additional findings were noted: 1. Thickening of the adrenal glands bilaterally without discrete nodule, concerning for adrenal hyperplasia.      ED POCUS Chest Bilateral   Final Result      XR Chest Portable   Final Result   No acute airspace disease.        Ecg 12 Lead    Result Date: 05/02/2019  NORMAL SINUS RHYTHM VOLTAGE CRITERIA FOR LEFT VENTRICULAR HYPERTROPHY T WAVE ABNORMALITY, CONSIDER LATERAL ISCHEMIA ABNORMAL ECG WHEN COMPARED WITH ECG OF 01-May-2019 23:01, NO SIGNIFICANT CHANGE WAS FOUND Confirmed by Gus Rankin ([id number]) on 05/02/2019 11:20:07 AM    Ecg 12 Lead    Result Date: 05/02/2019  NORMAL SINUS RHYTHM POSSIBLE LEFT ATRIAL ENLARGEMENT LEFT VENTRICULAR HYPERTROPHY NONSPECIFIC T WAVE ABNORMALITY ABNORMAL ECG WHEN COMPARED WITH ECG OF 01-May-2019 22:38, NO SIGNIFICANT CHANGE WAS FOUND Confirmed by Gus Rankin ([id number]) on 05/02/2019 11:19:42 AM    Ecg 12 Lead    Result Date: 05/02/2019  NORMAL SINUS RHYTHM POSSIBLE LEFT ATRIAL ENLARGEMENT LEFT VENTRICULAR HYPERTROPHY T WAVE ABNORMALITY, CONSIDER LATERAL ISCHEMIA ABNORMAL ECG WHEN COMPARED WITH ECG OF 03-Apr-2019 04:11, NONSPECIFIC T WAVE ABNORMALITY NOW EVIDENT IN INFERIOR LEADS T WAVE INVERSION NOW EVIDENT IN LATERAL LEADS  Confirmed by Gus Rankin [phone]) on 05/02/2019 11:19:07 AM    Ct Abdomen Pelvis Wo Contrast    Result Date: 05/02/2019 EXAM: CT ABDOMEN PELVIS WO CONTRAST DATE: 05/02/2019 3:33 AM ACCESSION: [id number] UN DICTATED: 05/02/2019 3:29 AM INTERPRETATION LOCATION: [facility] CLINICAL INDICATION: s/p renal transplant with abdominal pain ; Abdominal pain, acute, nonlocalized  COMPARISON: CT abdomen/pelvis 03/29/2019. TECHNIQUE: A spiral CT scan of the abdomen and pelvis was obtained without IV contrast from the lung bases through the pubic symphysis. Images were reconstructed in the axial plane. Coronal and sagittal reformatted images were also provided for further evaluation. FINDINGS: Evaluation of the solid organs and vasculature is limited in the absence of intravenous contrast. LINES AND TUBES: None. LOWER THORAX: Annular calcifications. HEPATOBILIARY: No focal hepatic lesions. The gallbladder is distended to the upper limits of normal. No biliary dilatation.  SPLEEN: Unremarkable. PANCREAS: Unremarkable. ADRENALS: Unremarkable. KIDNEYS/URETERS: Bilateral cortical atrophy and lobular contour consistent with chronic medical renal disease. No hydronephrosis or hydroureter. No radiopaque calculi. Unchanged 1.2 cm hypodensity in the right kidney lower pole. Sequelae of left lower quadrant renal transplant. Double-J ureteral stent with proximal portion in the transplanted renal pelvis and distal portion within the bladder. No significant perinephric fluid collection. Mild nonspecific perinephric inflammatory stranding. BLADDER: Unremarkable. PELVIC/REPRODUCTIVE ORGANS: Surgically absent uterus. GI TRACT: Small hiatal hernia. No dilated or thick walled loops of bowel. Colonic diverticulosis without evidence of diverticulitis. PERITONEUM/RETROPERITONEUM AND MESENTERY: No free air or fluid. LYMPH NODES: No enlarged lymph nodes. VESSELS: Normal in caliber. Severe calcified atherosclerotic disease of the abdominal aorta and its branch vessels. BONES AND SOFT TISSUES: Surgical staples and surgical scar lower quadrant soft tissue. Multilevel degenerative disease of the spine. No aggressive osseous lesions.     Sequelae of left lower quadrant renal transplant without significant perinephric fluid collection identified. No evidence of acute intra-abdominal aortic pathology as clinically questioned. Gallbladder is hydropic. No other evidence to suggest acute cholecystitis. ==================== ADDENDUM (05/02/2019 7:24 AM): On review, the following additional findings were noted: 1. Thickening of the adrenal glands bilaterally without discrete nodule, concerning for adrenal hyperplasia.    Xr Chest Portable    Result Date: 05/02/2019  EXAM: XR CHEST PORTABLE DATE: 05/01/2019 11:06 PM ACCESSION: [id number] UN DICTATED: 05/01/2019 11:06 PM INTERPRETATION LOCATION: [facility] CLINICAL INDICATION: 63 years old Female with CHEST PAIN  COMPARISON: Chest radiograph 04/04/2019 TECHNIQUE: Portable Chest Radiograph. FINDINGS: Vascular stent overlies the left lung apex, unchanged. Radiographically clear lungs. No focal consolidation. No demonstrable pleural effusion or pneumothorax. Stable cardiomediastinal silhouette.     No acute airspace disease.    Ct Chest Wo Contrast    Result Date: 05/02/2019 EXAM: CT CHEST WO CONTRAST DATE: 05/02/2019 7:59 AM ACCESSION: [id number] UN DICTATED: 05/02/2019 8:04 AM INTERPRETATION LOCATION: [facility] CLINICAL INDICATION: 63 years old Female with r/o aortic dissection, can't receive contrast bc of renal transplant  COMPARISON: CT abdomen 07/07/2017. TECHNIQUE: A helical CT scan was obtained without IV contrast from the thoracic inlet through the hemidiaphragms. Images were reconstructed in the axial plane.  Coronal and sagittal reformatted images of the chest were also provided for further evaluation of the lung parenchyma. FINDINGS: AIRWAYS, LUNGS, PLEURA: Clear central tracheobronchial tree.  No lung consolidation.  0.3 cm left lower lobe nodule (image 57, series 2) unchanged compared to 07/07/2017. No pleural effusion. MEDIASTINUM: Examination is nondiagnostic for the specific indication of aortic dissection. No thoracic aortic intramural hematoma. No displaced intimal calcifications. No mediastinal hematoma or fluid collection. Dilated left ventricle. Small pericardial effusion. No mediastinal  lymphadenopathy. Left subclavian vascular stent. IMAGED ABDOMEN: Atrophic kidneys. SOFT TISSUES: Unremarkable. BONES: Unremarkable.     Examination is nondiagnostic for the specific indication of aortic dissection due to the lack of intravenous contrast. No thoracic aortic intramural hematoma or displaced intimal calcifications. No mediastinal hematoma.    US Abdomen Complete    Result Date: 05/02/2019 EXAM: US ABDOMEN COMPLETE DATE: 05/02/2019 4:41 PM ACCESSION: [id number] UN DICTATED: 05/02/2019 4:41 PM INTERPRETATION LOCATION: [facility] CLINICAL INDICATION: 63 years old Female with RUQ pain, nausea, and vomiting  TECHNIQUE: Static and cine images of the complete abdomen were performed. COMPARISON: Same day CT abdomen and pelvis. Renal transplant ultrasound 04/28/2019. FINDINGS: LIVER: The liver is normal in echogenicity and contour. No focal hepatic lesions. No intrahepatic biliary ductal dilatation. The visualized common bile duct is normal in caliber. The distal common bile duct is not well-visualized due to overlying bowel gas. GALLBLADDER: The gallbladder is physiologically distended without internal stones or sludge. Sonographic Eulah Pont sign is negative.  No pericholecystic fluid. No gallbladder wall thickening. PANCREAS: Visualized portion is unremarkable. SPLEEN: Normal in size and echotexture. KIDNEYS: Limited evaluation due to adjacent bowel gas. Atrophic, echogenic bilateral native kidneys. No solid masses or calculi in the visualized kidneys. No hydronephrosis. No perinephric fluid collections. Limited evaluation of the left lower quadrant renal transplant demonstrates normal perfusion with power Doppler and no evidence of hydronephrosis.   VESSELS: Proximal aorta and IVC are not well-visualized due to overlying bowel gas. OTHER: No ascites. -Prominent gallbladder without evidence of cholelithiasis or biliary ductal dilatation.  Negative sonographic Murphys sign.  -Atrophic, echogenic native kidneys consistent with chronic medical renal disease.  Please see below for data measurements: Liver: 11.28 cm Common hepatic duct: 0.28 cm Proximal common bile duct: 0.57 cm Distal common bile duct: non vis Gallbladder wall:  0.22 cm Sonographic Murphy's Sign: negative Pericholecystic fluid visualized: no Right kidney: 6.85 cm Left kidney: 5.89 cm Aorta: not well visualized Inferior vena cava: not well visualized Spleen: 8.01 cm     Ed Pocus Chest Bilateral    Result Date: 05/02/2019 Limited Cardiac Ultrasound (CPT 98119-14) Indication: A focused ultrasound exam of the heart was performed to evaluate for pericardial effusion, tamponade, severe hypovolemia, or gross abnormalities of cardiac anatomy or function in this patient. The ultrasound was performed with the following indications, as noted in the H&P: Chest pain and Other indications as noted in the H&P Identified structures: The pericardial sac, myocardium, and 4 chambers were identified using the following views: parasternal long axis, parasternal short axis and apical 4-chamber Findings: Exam of the above structures revealed the following findings:  Pericardial effusion: Absent   Pericardial tamponade: N/A  Global LV function: Normal  Right ventricular size: Normal   Signs of RV strain: N/A  IVC: N/A  Other findings: MV is moderately thickened and calcified Limitations: None. Impression: No sonographic evidence of significant cardiac dysfunction, No sonographic evidence of significant pericardial effusion and Normal RV Other: n/a Interpreted by: Sondra Barges Yanis Larin Quality Assurance  After review of the point-of-care ultrasound performed in this case I assess the overall image quality as: Image quality: Minimal criteria met for diagnosis, all structures imaged well and diagnosis easily supported  The accuracy of interpretation of images as presented reflects a true positive. This study does  meet minimum criteria for credentialing and billing. Robbie Lis, MD           Pertinent labs & imaging results that were available during my care of the patient were reviewed  by me and considered in my medical decision making (see chart for details).    Please note- This chart has been created using [employer]. Chart creation errors have been sought, but may not always be located and such creation errors, especially pronoun confusion, do NOT reflect on the standard of medical care.     Sondra Barges Tavius Turgeon  Resident  05/05/19 1058

## 2019-05-02 NOTE — Unmapped (Signed)
Transplant Surgery  Initial Consult Note      Attending Physician:  No att. providers found  Inpatient Service:  Emergency Medicine  Date: 05/02/2019      Assessment :  Michele Cisneros is a 63 y.o. female with PMHx of CAD, HTN, COPD, and ESRD secondary to T2DM s/p DDKT on 03/30/19 who presented to [facility] with nausea, vomiting, and substernal chest pain. She was evaluated and underwent a CT chest, abdomen and pelvis, all of which were negative. Her Troponin was normal and pro- BNP was mildly elevated. She did respond to IV antacids and sucralfate. She has 12 episodes of vomiting yesterday, last episode here at [facility] last night. She occasionally complains of nausea when she burps. Her abdomen is soft, non tender and the staple line appears healthy.      Recommendations/Plan:    - As per Nephrology, Patient to be admitted under medicine for hydration and IV protonix for GERD.      Medications    No current facility-administered medications on file prior to encounter.      Current Outpatient Medications on File Prior to Encounter   Medication Sig Dispense Refill   ??? donepeziL (ARICEPT) 5 MG tablet Take 5 mg by mouth nightly.     ??? furosemide (LASIX) 80 MG tablet Take 80 mg by mouth Every Tuesday, Thursday, Saturday, Sunday. (non-dialysis days)     ??? isosorbide mononitrate (IMDUR) 60 MG 24 hr tablet Take 60 mg by mouth daily.     ??? acetaminophen (TYLENOL) 500 MG tablet Take 1-2 tablets (500-1,000 mg total) by mouth every six (6) hours as needed for pain or fever (> 38C or 100.65F). 30 tablet 0   ??? albuterol (PROVENTIL HFA;VENTOLIN HFA) 90 mcg/actuation inhaler Inhale 2 puffs every six (6) hours as needed for wheezing.     ??? amLODIPine (NORVASC) 10 MG tablet Take 1 tablet (10 mg total) by mouth daily. 30 tablet 11   ??? aspirin (ECOTRIN) 81 MG tablet Take 81 mg by mouth daily.     ??? atorvastatin (LIPITOR) 40 MG tablet Take 1 tablet (40 mg total) by mouth daily. 30 tablet 11 ??? blood sugar diagnostic (CONTOUR TEST STRIPS) Strp by Other route two (2) times a day. TEST BLOOD SUGARS 2 TIMES DAILY 60 strip 5   ??? blood-glucose meter (ONETOUCH ULTRA SYSTEM KIT) kit Please dispense for patient to use at home to check blood sugars.     ??? carvediloL (COREG) 12.5 MG tablet Take 3 tablets (37.5 mg total) by mouth Two (2) times a day. 180 tablet 11   ??? cloNIDine HCL (CATAPRES) 0.1 MG tablet Take 2 tablets (0.2 mg total) by mouth every eight (8) hours. 180 tablet 11   ??? docusate sodium (COLACE) 100 MG capsule Take 1 capsule (100 mg total) by mouth two (2) times a day as needed for constipation. 60 capsule 0   ??? LANCETS,THIN MISC Using at home to test blood sugars twice daily.     ??? magnesium oxide-Mg AA chelate (MAGNESIUM, AMINO ACID CHELATE,) 133 mg Tab Take 1 tablet by mouth Two (2) times a day. HOLD until directed to start by your coordinator. 100 tablet 11   ??? metFORMIN (GLUCOPHAGE XR) 500 MG 24 hr tablet Take 1 tablet (500 mg total) by mouth daily. 30 tablet 11   ??? mycophenolate (MYFORTIC) 180 MG EC tablet Take 3 tablets (540 mg total) by mouth Two (2) times a day. 180 tablet 11   ??? nitroglycerin (NITROSTAT) 0.4  MG SL tablet Place 0.4 mg under the tongue daily as needed.     ??? oxybutynin (DITROPAN) 5 MG tablet Take 1 tablet (5 mg total) by mouth Three (3) times a day for 14 days. 42 tablet 0   ??? oxyCODONE (ROXICODONE) 5 MG immediate release tablet Take 1-2 tablets (5-10 mg total) by mouth every six (6) hours as needed for pain. 40 tablet 0   ??? pantoprazole (PROTONIX) 40 MG tablet Take 1 tablet (40 mg total) by mouth Two (2) times a day. 60 tablet 11   ??? polyethylene glycol (GLYCOLAX) 17 gram/dose powder Mix 1 capful (17 g) in 4 to 8 ounces water, juice, soda, coffee, or tea and drink daily as needed. 510 g 0   ??? pregabalin (LYRICA) 75 MG capsule Take 1 capsule (75 mg total) by mouth Two (2) times a day. 60 capsule 3 ??? senna (SENOKOT) 8.6 mg tablet Take 1 tablet by mouth nightly as needed for constipation. 30 tablet 11   ??? sulfamethoxazole-trimethoprim (BACTRIM) 400-80 mg per tablet Take 1 tablet (80 mg of trimethoprim total) by mouth Every Monday, Wednesday, and Friday. 12 tablet 5   ??? tacrolimus (ENVARSUS XR) 1 mg Tb24 extended release tablet Take 3 tablets (3mg ) by mouth along with two 4mg  tablets daily. Total daily dose = 11mg . 90 tablet 11   ??? tacrolimus (ENVARSUS XR) 4 mg Tb24 extended release tablet Take 2 tablets (8 mg) by mouth with 3 (1mg ) tablets daily. Total daily dose = 11mg . 60 tablet 11   ??? valGANciclovir (VALCYTE) 450 mg tablet Take 1 tablet (450 mg total) by mouth daily. 30 tablet 2         Past Medical History  Past Medical History:   Diagnosis Date   ??? End stage renal disease (CMS-HCC) 11/08/2012   ??? ESRD on hemodialysis (CMS-HCC) 08/21/2011    Pt gets dialysis with [facility] in [address], [place], on a TTS schedule.  She started dialysis in Feb 2014.  Etiology of renal failure not known, likely diabetes.  Has a left upper arm AV graft as of Dec 2014.   On transplant list since 2015.  Dr. Cherylann Ratel RN Annabelle Harman [phone]  Last Assessment & Plan:  Stable. continues to follow with lateef. On kidney transplant list.   ??? Other and unspecified hyperlipidemia 11/08/2012   ??? Type II or unspecified type diabetes mellitus 11/08/2012   ??? Unspecified essential hypertension 11/08/2012         Past Surgical History  Past Surgical History:   Procedure Laterality Date   ??? AV FISTULA PLACEMENT     ??? HYSTERECTOMY  2000   ??? PR TRANSPLANTATION OF KIDNEY Left 03/30/2019    Procedure: RENAL ALLOTRANSPLANTATION, IMPLANTATION OF GRAFT; WITHOUT RECIPIENT NEPHRECTOMY;  Surgeon: Loney Hering, MD;  Location: MAIN OR [place];  Service: Transplant   ??? TOTAL ABDOMINAL HYSTERECTOMY W/ BILATERAL SALPINGOOPHORECTOMY  2000         Review of Systems  A 12 system review of systems was negative except as noted in HPI        Vital Signs Patient Vitals for the past 8 hrs:   BP Temp Temp src Pulse SpO2 Pulse Resp SpO2   05/02/19 1304 177/61 37.2 ??C (98.9 ??F) Oral 86 ??? 16 100 %   05/02/19 1200 161/43 ??? ??? 83 83 16 100 %   05/02/19 1100 150/51 ??? ??? 82 82 18 99 %   05/02/19 0900 164/54 ??? ??? 89 89 19 100 %  05/02/19 0800 186/63 ??? ??? 91 92 28 100 %   05/02/19 0719 192/59 ??? ??? 89 89 22 100 %   05/02/19 0717 198/59 ??? ??? 90 90 20 100 %   05/02/19 0600 ??? ??? ??? 89 89 21 100 %       No intake/output data recorded.      Physical Exam  Neuro: A&Ox4, pain well controlled  Pulmonary: Normal respiratory effort.   Cardiovascular: Regular rate and rhythm.   Abdo: soft, nontender, nondistended, no rebound, no guarding  GU: no Foley in place  Extr: Warm/well perfused, no edema    Labs and Studies    Labs:  Recent Results (from the past 24 hour(s))   ECG 12 Lead    Collection Time: 05/01/19 10:38 PM   Result Value Ref Range    EKG Systolic BP  mmHg    EKG Diastolic BP  mmHg    EKG Ventricular Rate 87 BPM    EKG Atrial Rate 87 BPM    EKG P-R Interval 152 ms    EKG QRS Duration 92 ms    EKG Q-T Interval 374 ms    EKG QTC Calculation 450 ms    EKG Calculated P Axis 57 degrees    EKG Calculated R Axis 34 degrees    EKG Calculated T Axis 101 degrees    QTC Fredericia 423 ms   Blood Gas, Venous    Collection Time: 05/01/19 10:45 PM   Result Value Ref Range    Specimen Source Venous     FIO2 Venous Room Air     pH, Venous 7.40 7.32 - 7.43    pCO2, Ven 28 (L) 40 - 60 mm Hg    pO2, Ven 51 30 - 55 mm Hg    HCO3, Ven 17 (L) 22 - 27 mmol/L    Base Excess, Ven -6.9 (L) -2.0 - 2.0    O2 Saturation, Venous 86.2 (H) 40.0 - 85.0 %   Lactic Acid, Venous, Whole Blood    Collection Time: 05/01/19 10:45 PM   Result Value Ref Range    Lactate, Venous 1.0 0.5 - 1.8 mmol/L   PT-INR    Collection Time: 05/01/19 10:45 PM   Result Value Ref Range    PT 13.1 10.5 - 13.5 sec    INR 1.11    APTT    Collection Time: 05/01/19 10:45 PM   Result Value Ref Range    APTT 36.8 25.3 - 37.1 sec Heparin Correlation 0.2    CBC w/ Differential    Collection Time: 05/01/19 10:45 PM   Result Value Ref Range    WBC 11.1 (H) 4.5 - 11.0 10*9/L    RBC 3.14 (L) 4.00 - 5.20 10*12/L    HGB 10.2 (L) 12.0 - 16.0 g/dL    HCT 29.5 (L) 62.1 - 46.0 %    MCV 100.1 (H) 80.0 - 100.0 fL    MCH 32.6 26.0 - 34.0 pg    MCHC 32.6 31.0 - 37.0 g/dL    RDW 30.8 (H) 65.7 - 15.0 %    MPV 10.6 (H) 7.0 - 10.0 fL    Platelet 224 150 - 440 10*9/L    Variable HGB Concentration Slight (A) Not Present    Neutrophils % 95.9 %    Lymphocytes % 1.0 %    Monocytes % 2.2 %    Eosinophils % 0.4 %    Basophils % 0.1 %    Neutrophil Left Shift 1+ (A)  Not Present    Absolute Neutrophils 10.6 (H) 2.0 - 7.5 10*9/L    Absolute Lymphocytes 0.1 (L) 1.5 - 5.0 10*9/L    Absolute Monocytes 0.2 0.2 - 0.8 10*9/L    Absolute Eosinophils 0.1 0.0 - 0.4 10*9/L    Absolute Basophils 0.0 0.0 - 0.1 10*9/L    Large Unstained Cells 0 0 - 4 %    Macrocytosis Moderate (A) Not Present    Anisocytosis Slight (A) Not Present    Hypochromasia Moderate (A) Not Present   Pro-BNP    Collection Time: 05/01/19 10:59 PM   Result Value Ref Range    PRO-BNP 2,820.0 (H) 0.0 - 226.0 pg/mL   Troponin I    Collection Time: 05/01/19 10:59 PM   Result Value Ref Range    Troponin I <0.034 <0.034 ng/mL   Magnesium Level    Collection Time: 05/01/19 10:59 PM   Result Value Ref Range    Magnesium 1.4 (L) 1.6 - 2.2 mg/dL   Comprehensive Metabolic Panel    Collection Time: 05/01/19 10:59 PM   Result Value Ref Range    Sodium 138 135 - 145 mmol/L    Potassium 5.0 3.5 - 5.0 mmol/L    Chloride 107 98 - 107 mmol/L    Anion Gap 15 7 - 15 mmol/L    CO2 16.0 (L) 22.0 - 30.0 mmol/L    BUN 13 7 - 21 mg/dL    Creatinine 1.61 (H) 0.60 - 1.00 mg/dL    BUN/Creatinine Ratio 13     EGFR CKD-EPI Non-African American, Female 60 >=60 mL/min/1.30m2    EGFR CKD-EPI African American, Female 56 >=60 mL/min/1.13m2    Glucose 196 (H) 70 - 179 mg/dL    Calcium 09.6 (H) 8.5 - 10.2 mg/dL    Albumin 4.1 3.5 - 5.0 g/dL Total Protein 6.7 6.5 - 8.3 g/dL    Total Bilirubin 0.8 0.0 - 1.2 mg/dL    AST 27 14 - 38 U/L    ALT 9 <35 U/L    Alkaline Phosphatase 109 38 - 126 U/L   Lipase Level    Collection Time: 05/01/19 10:59 PM   Result Value Ref Range    Lipase 17 (L) 44 - 232 U/L   ECG 12 Lead    Collection Time: 05/01/19 11:01 PM   Result Value Ref Range    EKG Systolic BP  mmHg    EKG Diastolic BP  mmHg    EKG Ventricular Rate 86 BPM    EKG Atrial Rate 86 BPM    EKG P-R Interval 150 ms    EKG QRS Duration 90 ms    EKG Q-T Interval 388 ms    EKG QTC Calculation 464 ms    EKG Calculated P Axis 41 degrees    EKG Calculated R Axis 22 degrees    EKG Calculated T Axis 104 degrees    QTC Fredericia 437 ms   ECG 12 Lead    Collection Time: 05/01/19 11:59 PM   Result Value Ref Range    EKG Systolic BP  mmHg    EKG Diastolic BP  mmHg    EKG Ventricular Rate 86 BPM    EKG Atrial Rate 86 BPM    EKG P-R Interval 150 ms    EKG QRS Duration 92 ms    EKG Q-T Interval 390 ms    EKG QTC Calculation 466 ms    EKG Calculated P Axis 52 degrees    EKG Calculated R Axis  32 degrees    EKG Calculated T Axis 117 degrees    QTC Fredericia 440 ms   Urinalysis    Collection Time: 05/02/19  2:39 AM   Result Value Ref Range    Color, UA Light Yellow     Clarity, UA Hazy     Specific Gravity, UA 1.013 1.003 - 1.030    pH, UA 5.0 5.0 - 9.0    Leukocyte Esterase, UA Trace (A) Negative    Nitrite, UA Negative Negative    Protein, UA Negative Negative    Glucose, UA 150 mg/dL (A) Negative    Ketones, UA 80 mg/dL (A) Negative    Urobilinogen, UA 0.2 mg/dL 0.2 mg/dL, 1.0 mg/dL    Bilirubin, UA Negative Negative    Blood, UA Small (A) Negative    RBC, UA 2 <=4 /HPF    WBC, UA 3 0 - 5 /HPF    Squam Epithel, UA 4 0 - 5 /HPF    Bacteria, UA Rare (A) None Seen /HPF    Mucus, UA Rare (A) None Seen /HPF   Toxicology screen, urine    Collection Time: 05/02/19  2:39 AM   Result Value Ref Range    Amphetamine Screen, Ur <500 ng/mL Not Applicable Barbiturate Screen, Ur <200 ng/mL Not Applicable    Benzodiazepine Screen, Urine <200 ng/mL Not Applicable    Cannabinoid Scrn, Ur =/>20 ng/mL (A) Not Applicable    Methadone Screen, Urine <300 ng/mL Not Applicable    Cocaine(Metab.)Screen, Urine <150 ng/mL Not Applicable    Opiate Scrn, Ur =/>300 ng/mL (A) Not Applicable   Troponin I    Collection Time: 05/02/19  3:29 AM   Result Value Ref Range    Troponin I <0.034 <0.034 ng/mL   COVID-19 PCR    Collection Time: 05/02/19  4:08 AM    Specimen: Nasopharyngeal/Oropharyngeal Swab   Result Value Ref Range    SARS-CoV-2 PCR Not Detected Not Detected   Pro-BNP    Collection Time: 05/02/19  5:53 AM   Result Value Ref Range    PRO-BNP 3,890.0 (H) 0.0 - 226.0 pg/mL         Micro:  Microbiology Results (last day)     Procedure Component Value Date/Time Date/Time    Blood Culture, Adult [[id number]] Collected: 05/02/19 1321    Lab Status: In process Specimen: Blood from 1 Peripheral Draw Updated: 05/02/19 1345    Blood Culture, Adult [[id number]] Collected: 05/02/19 1319    Lab Status: In process Specimen: Blood from 1 Peripheral Draw Updated: 05/02/19 1339    COVID-19 PCR [[id number]]  (Normal) Collected: 05/02/19 0408    Lab Status: Final result Specimen: Nasopharyngeal/Oropharyngeal Swab Updated: 05/02/19 0801     SARS-CoV-2 PCR Not Detected    Narrative:      This test was performed using the Abbott Alinity m SARS-CoV-2 assay which has been validated by the CLIA-certified, CAP-inspected [facility]. FDA has granted Emergency Use Authorization for this test. This real-time RT-PCR test detects SARS-CoV-2 by targeting the N and RdRp genes. Negative results do not preclude SARS-CoV-2 infection and should not be used as the sole basis for patient management decisions. Negative results must be combined with clinical observations, patient history, and epidemiological information. Information for providers and patients can be found here: [email or web address]                Imaging:       Ecg 12 Lead    Result Date:  05/02/2019  NORMAL SINUS RHYTHM VOLTAGE CRITERIA FOR LEFT VENTRICULAR HYPERTROPHY T WAVE ABNORMALITY, CONSIDER LATERAL ISCHEMIA ABNORMAL ECG WHEN COMPARED WITH ECG OF 01-May-2019 23:01, NO SIGNIFICANT CHANGE WAS FOUND Confirmed by Gus Rankin ([id number]) on 05/02/2019 11:20:07 AM    Ecg 12 Lead    Result Date: 05/02/2019  NORMAL SINUS RHYTHM POSSIBLE LEFT ATRIAL ENLARGEMENT LEFT VENTRICULAR HYPERTROPHY NONSPECIFIC T WAVE ABNORMALITY ABNORMAL ECG WHEN COMPARED WITH ECG OF 01-May-2019 22:38, NO SIGNIFICANT CHANGE WAS FOUND Confirmed by Gus Rankin ([id number]) on 05/02/2019 11:19:42 AM    Ecg 12 Lead    Result Date: 05/02/2019  NORMAL SINUS RHYTHM POSSIBLE LEFT ATRIAL ENLARGEMENT LEFT VENTRICULAR HYPERTROPHY T WAVE ABNORMALITY, CONSIDER LATERAL ISCHEMIA ABNORMAL ECG WHEN COMPARED WITH ECG OF 03-Apr-2019 04:11, NONSPECIFIC T WAVE ABNORMALITY NOW EVIDENT IN INFERIOR LEADS T WAVE INVERSION NOW EVIDENT IN LATERAL LEADS Confirmed by Gus Rankin ([id number]) on 05/02/2019 11:19:07 AM    Ct Abdomen Pelvis Wo Contrast    Result Date: 05/02/2019 EXAM: CT ABDOMEN PELVIS WO CONTRAST DATE: 05/02/2019 3:33 AM ACCESSION: [id number] UN DICTATED: 05/02/2019 3:29 AM INTERPRETATION LOCATION: [facility] CLINICAL INDICATION: s/p renal transplant with abdominal pain ; Abdominal pain, acute, nonlocalized  COMPARISON: CT abdomen/pelvis 03/29/2019. TECHNIQUE: A spiral CT scan of the abdomen and pelvis was obtained without IV contrast from the lung bases through the pubic symphysis. Images were reconstructed in the axial plane. Coronal and sagittal reformatted images were also provided for further evaluation. FINDINGS: Evaluation of the solid organs and vasculature is limited in the absence of intravenous contrast. LINES AND TUBES: None. LOWER THORAX: Annular calcifications. HEPATOBILIARY: No focal hepatic lesions. The gallbladder is distended to the upper limits of normal. No biliary dilatation.  SPLEEN: Unremarkable. PANCREAS: Unremarkable. ADRENALS: Unremarkable. KIDNEYS/URETERS: Bilateral cortical atrophy and lobular contour consistent with chronic medical renal disease. No hydronephrosis or hydroureter. No radiopaque calculi. Unchanged 1.2 cm hypodensity in the right kidney lower pole. Sequelae of left lower quadrant renal transplant. Double-J ureteral stent with proximal portion in the transplanted renal pelvis and distal portion within the bladder. No significant perinephric fluid collection. Mild nonspecific perinephric inflammatory stranding. BLADDER: Unremarkable. PELVIC/REPRODUCTIVE ORGANS: Surgically absent uterus. GI TRACT: Small hiatal hernia. No dilated or thick walled loops of bowel. Colonic diverticulosis without evidence of diverticulitis. PERITONEUM/RETROPERITONEUM AND MESENTERY: No free air or fluid. LYMPH NODES: No enlarged lymph nodes. VESSELS: Normal in caliber. Severe calcified atherosclerotic disease of the abdominal aorta and its branch vessels. BONES AND SOFT TISSUES: Surgical staples and surgical scar lower quadrant soft tissue. Multilevel degenerative disease of the spine. No aggressive osseous lesions.     Sequelae of left lower quadrant renal transplant without significant perinephric fluid collection identified. No evidence of acute intra-abdominal aortic pathology as clinically questioned. Gallbladder is hydropic. No other evidence to suggest acute cholecystitis. ==================== ADDENDUM (05/02/2019 7:24 AM): On review, the following additional findings were noted: 1. Thickening of the adrenal glands bilaterally without discrete nodule, concerning for adrenal hyperplasia.    Xr Chest Portable    Result Date: 05/02/2019  EXAM: XR CHEST PORTABLE DATE: 05/01/2019 11:06 PM ACCESSION: [id number] UN DICTATED: 05/01/2019 11:06 PM INTERPRETATION LOCATION: [facility] CLINICAL INDICATION: 63 years old Female with CHEST PAIN  COMPARISON: Chest radiograph 04/04/2019 TECHNIQUE: Portable Chest Radiograph. FINDINGS: Vascular stent overlies the left lung apex, unchanged. Radiographically clear lungs. No focal consolidation. No demonstrable pleural effusion or pneumothorax. Stable cardiomediastinal silhouette.     No acute airspace disease.    Ct Chest Wo Contrast    Result Date: 05/02/2019 EXAM: CT CHEST  WO CONTRAST DATE: 05/02/2019 7:59 AM ACCESSION: [id number] UN DICTATED: 05/02/2019 8:04 AM INTERPRETATION LOCATION: [facility] CLINICAL INDICATION: 63 years old Female with r/o aortic dissection, can't receive contrast bc of renal transplant  COMPARISON: CT abdomen 07/07/2017. TECHNIQUE: A helical CT scan was obtained without IV contrast from the thoracic inlet through the hemidiaphragms. Images were reconstructed in the axial plane.  Coronal and sagittal reformatted images of the chest were also provided for further evaluation of the lung parenchyma. FINDINGS: AIRWAYS, LUNGS, PLEURA: Clear central tracheobronchial tree.  No lung consolidation.  0.3 cm left lower lobe nodule (image 57, series 2) unchanged compared to 07/07/2017. No pleural effusion. MEDIASTINUM: Examination is nondiagnostic for the specific indication of aortic dissection. No thoracic aortic intramural hematoma. No displaced intimal calcifications. No mediastinal hematoma or fluid collection. Dilated left ventricle. Small pericardial effusion. No mediastinal lymphadenopathy. Left subclavian vascular stent. IMAGED ABDOMEN: Atrophic kidneys. SOFT TISSUES: Unremarkable. BONES: Unremarkable.     Examination is nondiagnostic for the specific indication of aortic dissection due to the lack of intravenous contrast. No thoracic aortic intramural hematoma or displaced intimal calcifications. No mediastinal hematoma.    Ed Pocus Chest Bilateral    Result Date: 05/02/2019 Limited Cardiac Ultrasound (CPT [id number]) Indication: A focused ultrasound exam of the heart was performed to evaluate for pericardial effusion, tamponade, severe hypovolemia, or gross abnormalities of cardiac anatomy or function in this patient. The ultrasound was performed with the following indications, as noted in the H&P: Chest pain and Other indications as noted in the H&P Identified structures: The pericardial sac, myocardium, and 4 chambers were identified using the following views: parasternal long axis, parasternal short axis and apical 4-chamber Findings: Exam of the above structures revealed the following findings:  Pericardial effusion: Absent   Pericardial tamponade: N/A  Global LV function: Normal  Right ventricular size: Normal   Signs of RV strain: N/A  IVC: N/A  Other findings: MV is moderately thickened and calcified Limitations: None. Impression: No sonographic evidence of significant cardiac dysfunction, No sonographic evidence of significant pericardial effusion and Normal RV Other: n/a Interpreted by: Sondra Barges [facility] Quality Assurance  After review of the point-of-care ultrasound performed in this case I assess the overall image quality as: Image quality: Minimal criteria met for diagnosis, all structures imaged well and diagnosis easily supported  The accuracy of interpretation of images as presented reflects a true positive. This study does  meet minimum criteria for credentialing and billing. Robbie Lis, MD

## 2019-05-03 DIAGNOSIS — Z940 Kidney transplant status: Principal | ICD-10-CM

## 2019-05-03 DIAGNOSIS — Z79899 Other long term (current) drug therapy: Principal | ICD-10-CM

## 2019-05-03 DIAGNOSIS — I2510 Atherosclerotic heart disease of native coronary artery without angina pectoris: Secondary | ICD-10-CM | POA: Diagnosis not present

## 2019-05-03 DIAGNOSIS — I130 Hypertensive heart and chronic kidney disease with heart failure and stage 1 through stage 4 chronic kidney disease, or unspecified chronic kidney disease: Secondary | ICD-10-CM | POA: Diagnosis present

## 2019-05-03 DIAGNOSIS — I120 Hypertensive chronic kidney disease with stage 5 chronic kidney disease or end stage renal disease: Secondary | ICD-10-CM | POA: Diagnosis not present

## 2019-05-03 DIAGNOSIS — K2210 Ulcer of esophagus without bleeding: Secondary | ICD-10-CM | POA: Diagnosis not present

## 2019-05-03 DIAGNOSIS — R112 Nausea with vomiting, unspecified: Secondary | ICD-10-CM | POA: Diagnosis not present

## 2019-05-03 DIAGNOSIS — N179 Acute kidney failure, unspecified: Secondary | ICD-10-CM | POA: Diagnosis not present

## 2019-05-03 DIAGNOSIS — I25118 Atherosclerotic heart disease of native coronary artery with other forms of angina pectoris: Secondary | ICD-10-CM | POA: Diagnosis present

## 2019-05-03 DIAGNOSIS — R109 Unspecified abdominal pain: Secondary | ICD-10-CM | POA: Diagnosis not present

## 2019-05-03 DIAGNOSIS — Z794 Long term (current) use of insulin: Secondary | ICD-10-CM | POA: Diagnosis not present

## 2019-05-03 DIAGNOSIS — Z20828 Contact with and (suspected) exposure to other viral communicable diseases: Secondary | ICD-10-CM | POA: Diagnosis present

## 2019-05-03 DIAGNOSIS — E1122 Type 2 diabetes mellitus with diabetic chronic kidney disease: Secondary | ICD-10-CM | POA: Diagnosis present

## 2019-05-03 DIAGNOSIS — R1310 Dysphagia, unspecified: Secondary | ICD-10-CM | POA: Diagnosis not present

## 2019-05-03 DIAGNOSIS — E785 Hyperlipidemia, unspecified: Secondary | ICD-10-CM | POA: Diagnosis present

## 2019-05-03 DIAGNOSIS — I252 Old myocardial infarction: Secondary | ICD-10-CM | POA: Diagnosis not present

## 2019-05-03 DIAGNOSIS — K2100 Gastro-esophageal reflux disease with esophagitis, without bleeding: Secondary | ICD-10-CM | POA: Diagnosis not present

## 2019-05-03 DIAGNOSIS — Z7984 Long term (current) use of oral hypoglycemic drugs: Secondary | ICD-10-CM | POA: Diagnosis not present

## 2019-05-03 DIAGNOSIS — R0789 Other chest pain: Secondary | ICD-10-CM | POA: Diagnosis not present

## 2019-05-03 DIAGNOSIS — K3189 Other diseases of stomach and duodenum: Secondary | ICD-10-CM | POA: Diagnosis not present

## 2019-05-03 DIAGNOSIS — Z7982 Long term (current) use of aspirin: Secondary | ICD-10-CM | POA: Diagnosis not present

## 2019-05-03 DIAGNOSIS — E119 Type 2 diabetes mellitus without complications: Secondary | ICD-10-CM | POA: Diagnosis not present

## 2019-05-03 DIAGNOSIS — K5900 Constipation, unspecified: Secondary | ICD-10-CM | POA: Diagnosis not present

## 2019-05-03 DIAGNOSIS — Z6826 Body mass index (BMI) 26.0-26.9, adult: Secondary | ICD-10-CM | POA: Diagnosis not present

## 2019-05-03 DIAGNOSIS — J449 Chronic obstructive pulmonary disease, unspecified: Secondary | ICD-10-CM | POA: Diagnosis not present

## 2019-05-03 DIAGNOSIS — D849 Immunodeficiency, unspecified: Secondary | ICD-10-CM | POA: Diagnosis not present

## 2019-05-03 DIAGNOSIS — Z87891 Personal history of nicotine dependence: Secondary | ICD-10-CM | POA: Diagnosis not present

## 2019-05-03 DIAGNOSIS — N2889 Other specified disorders of kidney and ureter: Secondary | ICD-10-CM | POA: Diagnosis not present

## 2019-05-03 DIAGNOSIS — I10 Essential (primary) hypertension: Secondary | ICD-10-CM | POA: Diagnosis not present

## 2019-05-03 DIAGNOSIS — N183 Chronic kidney disease, stage 3 unspecified: Secondary | ICD-10-CM | POA: Diagnosis present

## 2019-05-03 DIAGNOSIS — I5032 Chronic diastolic (congestive) heart failure: Secondary | ICD-10-CM | POA: Diagnosis present

## 2019-05-03 DIAGNOSIS — N186 End stage renal disease: Secondary | ICD-10-CM | POA: Diagnosis not present

## 2019-05-03 LAB — COMPREHENSIVE METABOLIC PANEL
ALBUMIN: 3.2 g/dL — ABNORMAL LOW (ref 3.5–5.0)
ALKALINE PHOSPHATASE: 95 U/L (ref 38–126)
ALT (SGPT): 7 U/L (ref <35)
ANION GAP: 9 mmol/L (ref 7–15)
AST (SGOT): 16 U/L (ref 14–38)
BILIRUBIN TOTAL: 0.5 mg/dL (ref 0.0–1.2)
BLOOD UREA NITROGEN: 11 mg/dL (ref 7–21)
BUN / CREAT RATIO: 10
CHLORIDE: 108 mmol/L — ABNORMAL HIGH (ref 98–107)
CO2: 21 mmol/L — ABNORMAL LOW (ref 22.0–30.0)
EGFR CKD-EPI AA FEMALE: 65 mL/min/{1.73_m2} (ref >=60)
EGFR CKD-EPI NON-AA FEMALE: 57 mL/min/{1.73_m2} — ABNORMAL LOW (ref >=60)
GLUCOSE RANDOM: 102 mg/dL (ref 70–179)
POTASSIUM: 4.4 mmol/L (ref 3.5–5.0)
PROTEIN TOTAL: 5.6 g/dL — ABNORMAL LOW (ref 6.5–8.3)
SODIUM: 138 mmol/L (ref 135–145)

## 2019-05-03 LAB — TACROLIMUS, TROUGH: Lab: 6.7

## 2019-05-03 LAB — CBC
HEMATOCRIT: 27.5 % — ABNORMAL LOW (ref 36.0–46.0)
HEMOGLOBIN: 9 g/dL — ABNORMAL LOW (ref 12.0–16.0)
MEAN CORPUSCULAR HEMOGLOBIN CONC: 33 g/dL (ref 31.0–37.0)
MEAN CORPUSCULAR HEMOGLOBIN: 33.2 pg (ref 26.0–34.0)
MEAN CORPUSCULAR VOLUME: 100.6 fL — ABNORMAL HIGH (ref 80.0–100.0)
MEAN PLATELET VOLUME: 10.2 fL — ABNORMAL HIGH (ref 7.0–10.0)
PLATELET COUNT: 164 10*9/L (ref 150–440)
RED BLOOD CELL COUNT: 2.73 10*12/L — ABNORMAL LOW (ref 4.00–5.20)
WBC ADJUSTED: 6.2 10*9/L (ref 4.5–11.0)

## 2019-05-03 LAB — WBC ADJUSTED: Leukocytes:NCnc:Pt:Bld:Qn:: 6.2

## 2019-05-03 LAB — PHOSPHORUS: Phosphate:MCnc:Pt:Ser/Plas:Qn:: 2.9

## 2019-05-03 LAB — MAGNESIUM: Magnesium:MCnc:Pt:Ser/Plas:Qn:: 1.4 — ABNORMAL LOW

## 2019-05-03 LAB — CHLORIDE: Chloride:SCnc:Pt:Ser/Plas:Qn:: 108 — ABNORMAL HIGH

## 2019-05-03 MED ORDER — FAMOTIDINE 20 MG PO TABS
20.00 | ORAL_TABLET | ORAL | Status: DC
Start: 2019-05-04 — End: 2019-05-03

## 2019-05-03 MED ORDER — INSULIN LISPRO 100 UNIT/ML ~~LOC~~ SOLN
0.00 | SUBCUTANEOUS | Status: DC
Start: 2019-05-10 — End: 2019-05-03

## 2019-05-03 MED ORDER — GENERIC EXTERNAL MEDICATION
Status: DC
Start: ? — End: 2019-05-03

## 2019-05-03 MED ORDER — SULFAMETHOXAZOLE-TRIMETHOPRIM 400-80 MG PO TABS
1.00 | ORAL_TABLET | ORAL | Status: DC
Start: 2019-05-11 — End: 2019-05-03

## 2019-05-03 MED ORDER — DONEPEZIL HCL 5 MG PO TABS
5.00 | ORAL_TABLET | ORAL | Status: DC
Start: 2019-05-10 — End: 2019-05-03

## 2019-05-03 MED ORDER — PREGABALIN 75 MG PO CAPS
75.00 | ORAL_CAPSULE | ORAL | Status: DC
Start: 2019-05-10 — End: 2019-05-03

## 2019-05-03 MED ORDER — ISOSORBIDE MONONITRATE ER 30 MG PO TB24
60.00 | ORAL_TABLET | ORAL | Status: DC
Start: 2019-05-11 — End: 2019-05-03

## 2019-05-03 MED ORDER — PANTOPRAZOLE SODIUM 40 MG IV SOLR
40.00 | INTRAVENOUS | Status: DC
Start: 2019-05-04 — End: 2019-05-03

## 2019-05-03 MED ORDER — CLONIDINE HCL 0.1 MG PO TABS
0.20 | ORAL_TABLET | ORAL | Status: DC
Start: 2019-05-10 — End: 2019-05-03

## 2019-05-03 MED ORDER — LACTATED RINGERS IV SOLN
75.00 | INTRAVENOUS | Status: DC
Start: ? — End: 2019-05-03

## 2019-05-03 MED ORDER — GENERIC EXTERNAL MEDICATION
540.00 | Status: DC
Start: 2019-05-03 — End: 2019-05-03

## 2019-05-03 MED ORDER — AMLODIPINE BESYLATE 10 MG PO TABS
10.00 | ORAL_TABLET | ORAL | Status: DC
Start: 2019-05-11 — End: 2019-05-03

## 2019-05-03 MED ORDER — HEPARIN SODIUM (PORCINE) 5000 UNIT/ML IJ SOLN
5000.00 | INTRAMUSCULAR | Status: DC
Start: 2019-05-10 — End: 2019-05-03

## 2019-05-03 MED ORDER — ASPIRIN 81 MG PO CHEW
81.00 | CHEWABLE_TABLET | ORAL | Status: DC
Start: 2019-05-11 — End: 2019-05-03

## 2019-05-03 MED ORDER — GENERIC EXTERNAL MEDICATION
11.00 | Status: DC
Start: 2019-05-04 — End: 2019-05-03

## 2019-05-03 MED ORDER — VALGANCICLOVIR HCL 450 MG PO TABS
450.00 | ORAL_TABLET | ORAL | Status: DC
Start: 2019-05-11 — End: 2019-05-03

## 2019-05-03 MED ORDER — ACETAMINOPHEN 500 MG PO TABS
500.00 | ORAL_TABLET | ORAL | Status: DC
Start: ? — End: 2019-05-03

## 2019-05-03 MED ORDER — ATORVASTATIN CALCIUM 40 MG PO TABS
40.00 | ORAL_TABLET | ORAL | Status: DC
Start: 2019-05-11 — End: 2019-05-03

## 2019-05-03 MED ORDER — DEXTROSE 50 % IV SOLN
12.50 | INTRAVENOUS | Status: DC
Start: ? — End: 2019-05-03

## 2019-05-03 MED ORDER — IPRATROPIUM-ALBUTEROL 0.5-2.5 (3) MG/3ML IN SOLN
3.00 | RESPIRATORY_TRACT | Status: DC
Start: ? — End: 2019-05-03

## 2019-05-03 MED ORDER — GENERIC EXTERNAL MEDICATION
37.50 | Status: DC
Start: 2019-05-10 — End: 2019-05-03

## 2019-05-03 MED ORDER — SENNOSIDES 8.6 MG PO TABS
1.00 | ORAL_TABLET | ORAL | Status: DC
Start: ? — End: 2019-05-03

## 2019-05-03 NOTE — Unmapped (Signed)
Problem: Adult Inpatient Plan of Care  Goal: Plan of Care Review  Outcome: Progressing  Goal: Patient-Specific Goal (Individualization)  Outcome: Progressing  Goal: Absence of Hospital-Acquired Illness or Injury  Outcome: Progressing  Goal: Optimal Comfort and Wellbeing  Outcome: Progressing  Goal: Readiness for Transition of Care  Outcome: Progressing  Goal: Rounds/Family Conference  Outcome: Progressing   Patient denies having pain. No complain of nausea or vomiting during this shift. Elevated blood pressures but patient is asymptomatic. She remained afebrile during this shift. She had uneventful night. Will continue to monitor.  Problem: COPD Comorbidity  Goal: Maintenance of COPD Symptom Control  Outcome: Progressing     Problem: Diabetes Comorbidity  Goal: Blood Glucose Level Within Desired Range  Outcome: Progressing     Problem: Hypertension Comorbidity  Goal: Blood Pressure in Desired Range  Outcome: Progressing     Problem: Wound  Goal: Optimal Wound Healing  Outcome: Progressing

## 2019-05-03 NOTE — Unmapped (Signed)
Social Work  Psychosocial Assessment    Patient Name: Michele Cisneros   Medical Record Number: [id number]   Date of Birth: 14-Dec-1955  Sex: Female     Referral  Referred by: Care Manager  Reason for Referral: Complex Discharge Planning  No Psychosocial Interventions Necessary: No Psychosocial Interventions Necessary    Extended Emergency Contact Information  Primary Emergency Contact: Glo Herring, [place] Phone: [phone]  Relation: Daughter  Secondary Emergency Contact: Merian Capron  Address: around corner from pt           [place], [place]  Home Phone: [phone]  Work Phone: [phone]  Mobile Phone: [phone]  Relation: Sister    Legal Next of Kin / Guardian / POA / Advance Directives    HCDM (patient stated preference): Earle Gell - Daughter - [phone]                   Discharge Planning  See CM Initial Transition Planning Assesssment for Discharge Planning Information.    Social Determinants of Health  Social Determinants of Health were addressed in provider documentation.  Please refer to patient history.    Social History  Support Systems/Concerns: Journalist, newspaper Service: No Conservator, museum/gallery and Psychiatric History  Psychosocial Stressors: Denies      Psychological Issues/Information: No issues              Chemical Dependency: None              Outpatient Providers: Specialist   Name / Solicitor #: Conservator, museum/gallery [facility]  Legal: No legal issues      Ability to [employer]: No issues accessing community services

## 2019-05-03 NOTE — Unmapped (Signed)
GASTROENTEROLOGY (LUMINAL) INPATIENT CONSULTATION H&P      Requesting Attending Physician:  Erlinda Hong,*  Requesting Consult Service: Med L    Reason for Consult:    Michele Cisneros is a 63 y.o. female seen in consultation at the request of Dr. Ladell Pier Rearick,* for nausea, vomiting, and dysphagia.    Assessment and Recommendations:     Michele Cisneros is a 63yo female with PMH ESRD 2/2 DM2 s/p renal transplant (03/30/19), GERD, HTN, MI (2014) on ASA who presented with chest pain, nausea, and vomiting.     Evaluation for cardiac etiologies of her chest pain has been unremarkable.  It appears that she is most bothered by burning chest pain with eating, more consistent with a odynophagia  We are most suspicious for reflux esophagitis or infectious esophagitis given her history.  We will plan to proceed with upper endoscopy tomorrow.    Recommendations:  - plan for EGD tomorrow, 11/25  - NPO at midnight  - continue IV PPI daily  - continue maalox, carafate prn (patient not previously aware these were available for her symptoms)      Thank you for this consult. Seen and discussed with Dr. Chales Abrahams. We will continue to follow along. Please page the GI Luminal consult pager at [phone] with any further questions or change in clinical condition.    [name], MD  Gastroenterology & Hepatology Fellow, PGY-4  [facility]      HPI:     Chief Complaint: chest pain    History of Present Illness: This is a 63 y.o. female with PMHx as noted below who presented with chest pain, nausea, and vomiting.  She reports that her symptoms started on Saturday and progressively worsened to the point that she was unable to drink water without pain, prompting her to present to the emergency department.  She divides further history that the pain is mainly present at rest, but exacerbated by eating or drinking anything.  She describes as a burning pain.  This is actually been present for the past month, starting shortly after her renal transplant.  She has never had this pain before.  Hiccups make the burning pain worse.  She denies abdominal pain, dysphagia, recent fevers.  She has had a regular bowel movement since transplant, but most recently had a normal formed stool yesterday.    She has been started on an IV PPI. GI cocktail in the ED provided relief of her symptoms, but they did return.  She was able to eat drink Ensure and a smoothie today but this exacerbated the burning pain.    Her medical history is notable for recent renal transplant for which she is maintained on tacrolimus and Myfortic.  She denies smoking marijuana, but does state that she previously took a supplement that contained CBD and has since discontinued this.  Her last colonoscopy was a couple years ago notable for 19 polyps.  She is due for repeat next year.    Review of Systems:  The balance of 12 systems reviewed is negative except as noted in the HPI.     Medical History:     Past Medical History:  Past Medical History:   Diagnosis Date   ??? End stage renal disease (CMS-HCC) 11/08/2012   ??? ESRD on hemodialysis (CMS-HCC) 08/21/2011 Pt gets dialysis with [facility] in [address], [place], on a TTS schedule.  She started dialysis in Feb 2014.  Etiology of renal failure not known, likely  diabetes.  Has a left upper arm AV graft as of Dec 2014.   On transplant list since 2015.  Dr. Cherylann Ratel RN Annabelle Harman [phone]  Last Assessment & Plan:  Stable. continues to follow with lateef. On kidney transplant list.   ??? Other and unspecified hyperlipidemia 11/08/2012   ??? Type II or unspecified type diabetes mellitus 11/08/2012   ??? Unspecified essential hypertension 11/08/2012       Surgical History:  Past Surgical History:   Procedure Laterality Date   ??? AV FISTULA PLACEMENT     ??? HYSTERECTOMY  2000   ??? PR TRANSPLANTATION OF KIDNEY Left 03/30/2019    Procedure: RENAL ALLOTRANSPLANTATION, IMPLANTATION OF GRAFT; WITHOUT RECIPIENT NEPHRECTOMY;  Surgeon: Loney Hering, MD;  Location: MAIN OR ;  Service: Transplant   ??? TOTAL ABDOMINAL HYSTERECTOMY W/ BILATERAL SALPINGOOPHORECTOMY  2000       Family History:  The patient's family history includes Breast cancer in her sister; Cancer in her father; Colon cancer (age of onset: 62) in her sister; Colon cancer (age of onset: 2) in her father; Diabetes in her mother; Heart disease in her mother..    Medications:   Current Facility-Administered Medications   Medication Dose Route Frequency Provider Last Rate Last Dose   ??? acetaminophen (TYLENOL) tablet 500 mg  500 mg Oral Q6H PRN Jen Mow, MD   500 mg at 05/02/19 1716   ??? sucralfate (CARAFATE) oral suspension  1 g Oral BID PRN Bennye Alm, MD   1 g at 05/03/19 0936    And   ??? lidocaine (XYLOCAINE) 2% viscous mucosal solution  10 mL Oral BID PRN Bennye Alm, MD   10 mL at 05/03/19 0936    And   ??? aluminum-magnesium hydroxide-simethicone (MAALOX MAX) 80-80-8 mg/mL oral suspension  30 mL Oral BID PRN Bennye Alm, MD   30 mL at 05/03/19 0936   ??? amLODIPine (NORVASC) tablet 10 mg  10 mg Oral Daily Jen Mow, MD   10 mg at 05/03/19 0831 ??? aspirin chewable tablet 81 mg  81 mg Oral Daily Jen Mow, MD   81 mg at 05/03/19 0831   ??? atorvastatin (LIPITOR) tablet 40 mg  40 mg Oral Daily Jen Mow, MD   40 mg at 05/03/19 0831   ??? carvediloL (COREG) tablet 37.5 mg  37.5 mg Oral BID Jen Mow, MD   37.5 mg at 05/03/19 0831   ??? cloNIDine HCL (CATAPRES) tablet 0.2 mg  0.2 mg Oral Q8H Jen Mow, MD   0.2 mg at 05/03/19 0936   ??? dextrose 50 % in water (D50W) 50 % solution 12.5 g  12.5 g Intravenous Q10 Min PRN Bennye Alm, MD       ??? donepeziL (ARICEPT) tablet 5 mg  5 mg Oral Nightly Jen Mow, MD   5 mg at 05/02/19 2012   ??? famotidine (PEPCID) tablet 20 mg  20 mg Oral Daily Tiffany D Long, MD   20 mg at 05/03/19 5621   ??? furosemide (LASIX) tablet 80 mg  80 mg Oral Tu,Th,Sa,Su Jen Mow, MD       ??? heparin (porcine) injection 5,000 Units  5,000 Units Subcutaneous Q8H [facility] Jen Mow, MD   5,000 Units at 05/03/19 3086   ??? insulin lispro (HumaLOG) injection 0-12 Units  0-12 Units Subcutaneous ACHS Bennye Alm, MD   2 Units at 05/02/19 2132   ??? ipratropium-albuteroL (DUO-NEB) 0.5-2.5  mg/3 mL nebulizer solution 3 mL  3 mL Nebulization Q6H PRN Jen Mow, MD       ??? lactated Ringers infusion  75 mL/hr Intravenous Continuous Jen Mow, MD       ??? mycophenolate (MYFORTIC) EC tablet 540 mg  540 mg Oral BID Jen Mow, MD   540 mg at 05/03/19 0856   ??? oxyCODONE (ROXICODONE) immediate release tablet 5 mg  5 mg Oral Q6H PRN Tiffany D Long, MD   5 mg at 05/03/19 0930    Or   ??? oxyCODONE (ROXICODONE) immediate release tablet 10 mg  10 mg Oral Q6H PRN Tiffany D Long, MD       ??? pantoprazole (PROTONIX) injection 40 mg  40 mg Intravenous Daily Bennye Alm, MD   40 mg at 05/03/19 3244   ??? pregabalin (LYRICA) capsule 75 mg  75 mg Oral BID Jen Mow, MD   75 mg at 05/03/19 0831   ??? senna (SENOKOT) tablet 1 tablet  1 tablet Oral Nightly PRN Jen Mow, MD ??? sulfamethoxazole-trimethoprim (BACTRIM) 400-80 mg tablet 80 mg of trimethoprim  1 tablet Oral Q MWF Jen Mow, MD   80 mg of trimethoprim at 05/02/19 1806   ??? tacrolimus (ENVARSUS XR) extended release tablet 11 mg  11 mg Oral Daily Jen Mow, MD   11 mg at 05/03/19 0856   ??? valGANciclovir (VALCYTE) tablet 450 mg  450 mg Oral Daily Jen Mow, MD   450 mg at 05/03/19 0831       Allergies:   Hydrocodone and Lisinopril    Social History:  Social History     Tobacco Use   ??? Smoking status: Former Smoker     Packs/day: 0.25     Years: 30.00     Pack years: 7.50     Types: Cigarettes     Quit date: 05/09/2013     Years since quitting: 5.9   ??? Smokeless tobacco: Never Used   ??? Tobacco comment: 1 pack every week; she reports historical periods of abstinence   Substance Use Topics   ??? Alcohol use: Not Currently     Comment: very rare use (reports once over last year)   ??? Drug use: No     She lives in [place] with her son.  Denies tobacco, alcohol, drug use.  Previously took a supplement with CBD in it.    Objective:      Vital Signs/Weight:  Temp:  [35.6 ??C-37.2 ??C] 36.3 ??C  Heart Rate:  [82-88] 82  SpO2 Pulse:  [83-85] 85  Resp:  [16-20] 18  BP: (156-204)/(43-84) 166/73  MAP (mmHg):  [77-105] 105  SpO2:  [97 %-100 %] 99 %  Wt Readings from Last 3 Encounters:   05/02/19 69.5 kg (153 lb 4.8 oz)   04/25/19 74.4 kg (164 lb)   04/25/19 74.4 kg (164 lb)       Physical Exam:  GEN: Comfortable appearing woman laying in bed in NAD  EYES: EOMI, no scleral icterus  ENT: ATNC, MMM, OP clear  NECK: supple, no LAD  CV: NRRR, normal S1S2, no murmurs appreciated  PULM: normal WOB on room air, CTAB  ABD: +BS, soft, non-distended, tender to palpation in epigastric region  EXT: warm, no peripheral edema  SKIN: no eruptions on visible skin; no jaundice  NEURO: awake and alert; oriented x3; no gross focal deficits; moves all 4 extremities  PSYCH: appropriate affect; engaged and interactive  Diagnostic Studies: I reviewed all pertinent diagnostic studies, including:      Labs:    Recent Labs     05/01/19  2245 05/03/19  0654   WBC 11.1* 6.2   HGB 10.2* 9.0*   HCT 31.4* 27.5*   PLT 224 164     Recent Labs     05/01/19  2259 05/03/19  0654   NA 138 138   K 5.0 4.4   CL 107 108*   BUN 13 11   CREATININE 1.02* 1.06*   GLU 196* 102     Recent Labs     05/01/19  2259 05/03/19  0654   PROT 6.7 5.6*   ALBUMIN 4.1 3.2*   AST 27 16   ALT 9 7   ALKPHOS 109 95   BILITOT 0.8 0.5     Recent Labs     05/01/19  2245   INR 1.11   APTT 36.8     No results for input(s): CRP in the last 72 hours.  No results for input(s): IRON, TIBC, FERRITIN in the last 72 hours.    Imaging:   U/S abdomen 05/02/19:  -Prominent gallbladder without evidence of cholelithiasis or biliary ductal dilatation.  Negative sonographic Murphys sign.    -Atrophic, echogenic native kidneys consistent with chronic medical renal disease.      CT chest w/o contrast 05/02/19:  Examination is nondiagnostic for the specific indication of aortic dissection due to the lack of intravenous contrast.  No thoracic aortic intramural hematoma or displaced intimal calcifications.  No mediastinal hematoma.    CT A/P w/o contrast 05/02/19:  Sequelae of left lower quadrant renal transplant without significant perinephric fluid collection identified.  No evidence of acute intra-abdominal aortic pathology as clinically questioned.  Gallbladder is hydropic. No other evidence to suggest acute cholecystitis.  ??  ====================  ADDENDUM (05/02/2019 7:24 AM):   On review, the following additional findings were noted:  1. Thickening of the adrenal glands bilaterally without discrete nodule, concerning for adrenal hyperplasia.      ECHO 03/29/19:    1. The left ventricle is normal in size with mildly increased wall  thickness.    2. Normal left ventricular systolic function, ejection fraction 65-70%.    3. Dilated left atrium - mildly to moderately dilated. 4. Degenerative mitral valve disease - moderately thickened.    5. Mitral annular calcification.    6. Mitral regurgitation - moderate to severe.    7. Aortic sclerosis.    8. Mildly elevated right atrial pressure.    9. Normal right ventricular size and systolic function.       Microbiology:  Covid negative 05/02/19  Blood cx pending    GI Procedures:   None available for review

## 2019-05-03 NOTE — Unmapped (Signed)
Adult Nutrition Assessment Note    Visit Type: RN Consult  Reason for Visit: Per Admission Nutrition Screen (Adult), Have you gained or lost 10 pounds in the past 3 months?, Have you had a decrease in food intake or appetite?    ASSESSMENT:   HPI & PMH: Michele Cisneros is a 63 y.o. female with PMHx of CAD, HTN, COPD, and ESRD secondary to T2DM s/p DDKT on 03/30/19 who presented to [facility] with nausea, vomiting, and substernal chest pain    Nutrition Hx: Patient reports decreased appetite since her kidney transplant. States she has only been eating breakfast, no other snacks or meals throughout the day. For the past 4 days she has been avoiding eating and drinking due to N/V, heart burn and reflux. Discussed small, frequent meals. Patient agreeable to ONS. She is very concerned about hyperglycemia.     Nutritionally Pertinent Meds: Lasix, Protonix, Insulin  Labs: Mg 1.4   Abd/GI: Last BM 11/23  Skin:   Patient Lines/Drains/Airways Status    Active Wounds     Name:   Placement date:   Placement time:   Site:   Days:    Surgical Site 03/30/19 Abdomen   03/30/19    1333     33               Current nutrition therapy order:   Nutrition Orders          Nutrition Therapy Regular/House starting at 11/23 1552           Anthropometric Data:  -- Height:     -- Last recorded weight: 69.5 kg (153 lb 4.8 oz)  -- Admission weight: 69.5 kg (153 lb 4.8 oz)  -- IBW:    -- Percent IBW:    -- BMI: Body mass index is 24.01 kg/m??.   -- Weight changes this admission:   Last 5 Recorded Weights    05/02/19 1745   Weight: 69.5 kg (153 lb 4.8 oz)      -- Weight history PTA: 29 lb weight loss (16%) x 1 month - severe   Wt Readings from Last 10 Encounters:   05/02/19 69.5 kg (153 lb 4.8 oz)   04/25/19 74.4 kg (164 lb)   04/25/19 74.4 kg (164 lb)   04/25/19 74.4 kg (164 lb)   04/19/19 76.1 kg (167 lb 12.8 oz)   04/11/19 83 kg (183 lb)   04/11/19 83 kg (183 lb)   04/06/19 83 kg (182 lb 14.4 oz)   09/03/17 78.7 kg (173 lb 9.6 oz) 05/29/16 78 kg (171 lb 14.4 oz)        Daily Estimated Nutrient Needs:   Energy: 1750-2100 kcals [25-30 kcal/kg using admission body weight, 70 kg (05/03/19 0858)]  Protein: 84-105 gm [1.2-1.5 gm/kg using admission body weight, 70 kg (05/03/19 0858)]  Carbohydrate:   [45-60% of kcal]  Fluid:   [per MD team]     Nutrition Focused Physical Exam:  Fat Areas Examined  Orbital: No loss  Upper Arm: No loss      Muscle Areas Examined  Temple: Mild loss  Clavicle: No loss  Acromion: No loss  Dorsal Hand: Moderate loss  Patellar: Mild loss  Anterior Thigh: Mild loss  Posterior Calf: Severe loss         Nutrition Evaluation  Overall Impressions: No fat loss;Mild muscle loss (05/03/19 1040)  Nutrition Designation: Normal weight (BMI 18.50 - 24.99 kg/m2) (05/03/19 1040)     DIAGNOSIS:  Malnutrition Assessment using  AND/ASPEN Clinical Characteristics:    Severe Protein-Calorie Malnutrition in the context of acute illness or injury (05/03/19 1041)     Energy Intake: < or equal to 50% of estimated energy requirement of > or equal to 5 days  Interpretation of Wt. Loss: > 5% x 1 month                 Overall nutrition impression: Patient not meeting needs PO at this time and would benefit from high calorie oral nutrition supplements. Patient prefers low sugar supplement in vanilla.      GOALS:  Oral Intake:       - Patient to consume 50% of 4-5 small meals per day.     RECOMMENDATIONS AND INTERVENTIONS:  1. Continue current nutrition therapy, encourage PO   2. Ensure HP TID  3. Recommend addition of MVI w/ minerals  4. Replete lytes prn   5. Weekly weights     RD Follow Up Parameters:  1-2 times per week (and more frequent as indicated)     Lavella Lemons, MS, RD, LDN  Pager: [phone]

## 2019-05-03 NOTE — Unmapped (Signed)
Internal Medicine (MDL) Progress Note    Assessment & Plan:   Michele Cisneros is a 63 y.o. female with a PMHx of CAD, HTN, COPD, and ESRD secondary to T2DM s/p DDKT on 03/30/19 who presented to [facility] with nausea, vomiting, and substernal chest pain. .    Principal Problem:    GERD (gastroesophageal reflux disease)  Active Problems:    Status post deceased-donor kidney transplantation    Essential hypertension    COPD (chronic obstructive pulmonary disease) (CMS-HCC)    Coronary artery disease of native artery of native heart with stable angina pectoris (CMS-HCC)  Resolved Problems:    * No resolved hospital problems. *    Nausea - Vomiting: Onset after patient began experiencing severe burning mid-epigastric/substernal pain. ACS rule out in ED with CT non-contrast nondiagnostic of aortic dissection. Troponins negative. Workup negative for acute intra-abdominal process. Pain seems most consistent with GERD which has been worsening over past few weeks. Further history reveals trouble swallowing food and pills but not liquids.   - GI consult to eval for possible stricture  - Continue fluids LR 100 ml/hr   - Advance diet as tolerated  - Pantoprazole 40mg  IV  - GI cocktail prn  - Famotidine prn  - Acetaminophen for pain  - Home pregabalin  ??  ESRD s/p renal transplant: ESRD secondary to DM, s/p kidney transplant on 10/21. Followed by [facility]. No issues with transplanted kidney. Transplant meds include tacrolimus and Myfortic. Tac level on admission 8.3. Nephrology consulted on admission.  - Pharmacy consult for tacrolimus dosing  - Continue tacrolimus and Myfortic  - Clarify Lasix dosing  - Valtrex and Bactrim prophylaxis  - Avoid nephrotoxins and renally dose medications  ??  Chronic medical problems:  Hypertension: Noted to be hypertensive on last clinic note. Continue amlodipine, clonidine  COPD: Duonebs prn  CAD: Continue carvedilol  DM: SSI    Daily Checklist:  Diet: Regular Diet  DVT PPx: Heparin 5000units q8h Electrolytes: Replete Potassium to >/= 3.6 and Magnesium to >/= 1.8  Code Status: Code with Limitations  Dispo: Continue floor care    Subjective:   No acute events overnight. Burning pain when drinking water.    Objective:   Temp:  [35.6 ??C-37.2 ??C] 36.3 ??C  Heart Rate:  [82-88] 82  SpO2 Pulse:  [85] 85  Resp:  [16-20] 18  BP: (156-204)/(51-84) 166/73  SpO2:  [97 %-100 %] 99 %    Gen: NAD, alert, oriented, answers questions appropriately  HEENT: atraumatic, sclera anicteric, MMM. OP w/o erythema or exudate   Heart: RRR, S1, S2, no chest wall tenderness  Lungs: CTAB, no crackles or wheezes, no use of accessory muscles  Abdomen: Normoactive bowel sounds, abdomen tender  Extremities: no clubbing, cyanosis, or edema  Psych: Appropriate mood and affect    Labs/Studies: Labs and Studies from the last 24hrs per EMR and Reviewed

## 2019-05-04 ENCOUNTER — Telehealth: Payer: Self-pay | Admitting: *Deleted

## 2019-05-04 LAB — COMPREHENSIVE METABOLIC PANEL
ALBUMIN: 2.9 g/dL — ABNORMAL LOW (ref 3.5–5.0)
ALKALINE PHOSPHATASE: 86 U/L (ref 38–126)
ANION GAP: 5 mmol/L — ABNORMAL LOW (ref 7–15)
AST (SGOT): 17 U/L (ref 14–38)
BILIRUBIN TOTAL: 0.5 mg/dL (ref 0.0–1.2)
BLOOD UREA NITROGEN: 15 mg/dL (ref 7–21)
BUN / CREAT RATIO: 13
CALCIUM: 9.7 mg/dL (ref 8.5–10.2)
CO2: 22 mmol/L (ref 22.0–30.0)
CREATININE: 1.12 mg/dL — ABNORMAL HIGH (ref 0.60–1.00)
EGFR CKD-EPI AA FEMALE: 61 mL/min/{1.73_m2} (ref >=60)
EGFR CKD-EPI NON-AA FEMALE: 53 mL/min/{1.73_m2} — ABNORMAL LOW (ref >=60)
GLUCOSE RANDOM: 96 mg/dL (ref 70–179)
POTASSIUM: 4.3 mmol/L (ref 3.5–5.0)
PROTEIN TOTAL: 5.1 g/dL — ABNORMAL LOW (ref 6.5–8.3)
SODIUM: 133 mmol/L — ABNORMAL LOW (ref 135–145)

## 2019-05-04 LAB — CBC
HEMATOCRIT: 24.9 % — ABNORMAL LOW (ref 36.0–46.0)
HEMOGLOBIN: 7.9 g/dL — ABNORMAL LOW (ref 12.0–16.0)
MEAN CORPUSCULAR HEMOGLOBIN: 31.8 pg (ref 26.0–34.0)
MEAN PLATELET VOLUME: 10 fL (ref 7.0–10.0)
PLATELET COUNT: 170 10*9/L (ref 150–440)
RED BLOOD CELL COUNT: 2.49 10*12/L — ABNORMAL LOW (ref 4.00–5.20)
RED CELL DISTRIBUTION WIDTH: 17.9 % — ABNORMAL HIGH (ref 12.0–15.0)
WBC ADJUSTED: 4.3 10*9/L — ABNORMAL LOW (ref 4.5–11.0)

## 2019-05-04 LAB — MEAN PLATELET VOLUME: Lab: 10

## 2019-05-04 LAB — MAGNESIUM
Magnesium:MCnc:Pt:Ser/Plas:Qn:: 1.5 — ABNORMAL LOW
Magnesium:MCnc:Pt:Ser/Plas:Qn:: 1.5 — ABNORMAL LOW

## 2019-05-04 LAB — SODIUM: Sodium:SCnc:Pt:Ser/Plas:Qn:: 133 — ABNORMAL LOW

## 2019-05-04 LAB — PHOSPHORUS: Phosphate:MCnc:Pt:Ser/Plas:Qn:: 3.1

## 2019-05-04 NOTE — Telephone Encounter (Signed)
Left message for patient to return call back. PEC may give information.  

## 2019-05-04 NOTE — Telephone Encounter (Signed)
Not currently you can double book a 1 pm appt  [place]

## 2019-05-04 NOTE — Telephone Encounter (Signed)
Any places you could work her in?

## 2019-05-04 NOTE — Telephone Encounter (Signed)
Unable to reach patient for transitional care management. No answer.  OK to schedule hospital follow up as double booking at 1:00 in office visit, per primary care provider.  PEC may relay this information to the patient and schedule accordingly.

## 2019-05-04 NOTE — Telephone Encounter (Signed)
Copied from [place] [id number]. Topic: General - Other >> May 04, 2019 11:34 AM Celene Kras wrote: Reason for CRM: Sloan Leiter, from [facility] hospital, called to schedule pt with a hospital f/u. PCP does not have any in office visits until 06/2019. Please advise.

## 2019-05-04 NOTE — Unmapped (Signed)
This SW engaged in a secure chat with GI fellow Mellissa Kohut who noted that patient will receive a GI endoscopy today. No other concerns were addressed at this time.     Gabriela Eves, LCSWA

## 2019-05-04 NOTE — Unmapped (Signed)
OCCUPATIONAL THERAPY  Evaluation(w/ PT Sandria Senter 2/2 pt activity tolerance) (05/04/19 0909)    Patient Name:  Michele Cisneros       Medical Record Number: [id number]   Date of Birth: 09-01-1955  Sex: Female          OT Treatment Diagnosis:  Decreased activity toleranc, generalized deconditioning, and decreased balance impacting safe participation in ADL/IADL routines        Assessment    Problem List: Decreased strength, Decreased endurance, Impaired balance, Decreased mobility, Fall Risk, Impaired ADLs    Clinical Decision Making: Moderate    Assessment: Michele Cisneros is a 63 y.o. female with PMHx of CAD, HTN, COPD, and ESRD secondary to T2DM s/p DDKT on 03/30/19 who presented to [facility] with nausea, vomiting, and substernal chest pain.     Michele Cisneros presents to skilled acute OT services with decreased activity toleranc, generalized deconditioning, and decreased balance impacting safe participation in ADL/IADL routines. Pt completed and assisted with functional bed mobility, functional t/f, LB dressing, standing grooming, toileting routine in bathroom, and functional mobility simulating distances between ADL environments. This pt would benefit from continued skilled acute OT services to address ADL deficits and promote safety and functional independence during daily activities. Based on consideration of pt's occupational profile, assessment review, level of clinical decision making involved, and intervention plan, this pt is considered to be a moderate complexity case. This pt would benefit from 3x/week post-acute skilled OT services to maximize safe engagement in daily routines, life roles, and meaningful occupations. Today's Interventions: Pt completed and assisted with functional bed mobility, functional t/f, LB dressing, standing grooming, toileting routine in bathroom, and functional mobility simulating distances between ADL environments. Pt provided skilled OT education re: role of acute OT, POC, benefits of continued OOB ADL to promote increased activity tolerance, safety/falls education, and energy conservation strategies.    Activity Tolerance During Today's Session  Patient tolerated treatment well, Patient limited by fatigue    Plan  Planned Frequency of Treatment:  1-2x per day for: 2-3x week       Planned Interventions:  Adaptive equipment, ADL retraining, Balance activities, Bed mobility, Compensatory tech. training, Conservation, Education - Patient, Home exercise program, Functional mobility, Functional cognition, Endurance activities, Education - Family / caregiver, Positioning, Range of motion, Safety education, Patent attorney / perceptual tasks, UE Strength / coordination exercise, Transfer training, Therapeutic exercise    Post-Discharge Occupational Therapy Recommendations:  OT Post Acute Discharge Recommendations: 3x weekly   OT DME Recommendations: None    GOALS:   Patient and Family Goals: To regain energy and independence    Long Term Goal #1: Pt will score 24/24 on AMPAC in 4 weeks       Short Term:  Pt will complete toilet t/f and toileting routine with mod I requiring no cueing for falls prevention/safety   Time Frame : 2 weeks  Pt will complete full body dressing with setup A   Time Frame : 2 weeks  Pt will complete 5+ min standing ADL with mod I   Time Frame : 2 weeks                  Prognosis:  Good  Positive Indicators:  PLOF, family support, agreeable to participate  Barriers to Discharge: Endurance deficits    Subjective  Current Status Pt received/left semi-reclined at bed level, call bell in reach, all immediate needs met, RN updated and aware Prior Functional Status Prior to recent admission, Michele Cisneros reports independence with  ADL. She was not utilizing AD for functional mobility, but denies recent falls. She reports that she has not been completing showers since d/c from her kidney transplant 2/2 surgical staples. She denied DME utilization for toileting routines. She reports that her sister has been staying with her when her son is out of the home for work. Family assists with meal preparation, household management, driving/transportation, and setup for dressing routines 2/2 decreased bending. She reports that she is used to being independent and having enough energy, so this recent change has been difficult to navigate.    Medical Tests / Procedures: reviewed       Patient / Caregiver reports: When you're so used to being independent, it's just hard.    Past Medical History:   Diagnosis Date   ??? End stage renal disease (CMS-HCC) 11/08/2012   ??? ESRD on hemodialysis (CMS-HCC) 08/21/2011    Pt gets dialysis with [facility] in [address], [place], on a TTS schedule.  She started dialysis in Feb 2014.  Etiology of renal failure not known, likely diabetes.  Has a left upper arm AV graft as of Dec 2014.   On transplant list since 2015.  Dr. Cherylann Ratel RN [facility] [phone]  Last Assessment & Plan:  Stable. continues to follow with lateef. On kidney transplant list.   ??? Other and unspecified hyperlipidemia 11/08/2012   ??? Type II or unspecified type diabetes mellitus 11/08/2012   ??? Unspecified essential hypertension 11/08/2012    Social History     Tobacco Use   ??? Smoking status: Former Smoker     Packs/day: 0.25     Years: 30.00     Pack years: 7.50     Types: Cigarettes     Quit date: 05/09/2013     Years since quitting: 5.9   ??? Smokeless tobacco: Never Used   ??? Tobacco comment: 1 pack every week; she reports historical periods of abstinence   Substance Use Topics   ??? Alcohol use: Not Currently     Comment: very rare use (reports once over last year) Past Surgical History:   Procedure Laterality Date   ??? AV FISTULA PLACEMENT     ??? HYSTERECTOMY  2000   ??? PR TRANSPLANTATION OF KIDNEY Left 03/30/2019    Procedure: RENAL ALLOTRANSPLANTATION, IMPLANTATION OF GRAFT; WITHOUT RECIPIENT NEPHRECTOMY;  Surgeon: Loney Hering, MD;  Location: MAIN OR [place];  Service: Transplant   ??? TOTAL ABDOMINAL HYSTERECTOMY W/ BILATERAL SALPINGOOPHORECTOMY  2000    Family History   Problem Relation Age of Onset   ??? Heart disease Mother    ??? Diabetes Mother    ??? Cancer Father    ??? Colon cancer Father 48   ??? Colon cancer Sister 60   ??? Breast cancer Sister         Hydrocodone and Lisinopril     Objective Findings  Precautions / Restrictions  Falls precautions, Protective precautions, Other precautions(no BLT)    Weight Bearing  Non-applicable    Required Braces or Orthoses  Non-applicable    Communication Preference  Verbal    Pain  pt denied pain    Equipment / Environment  Patient not wearing mask for full session, Vascular access (PIV, TLC, Port-a-cath, PICC)    Living Situation  Living Environment: House  Lives With: Son(sister has been staying with pt while son is out at work)  Home Living: One level home, Level entry, Stairs to enter with rails, Standard height toilet, Tub/shower unit(level entry in the  back, STE in the front)  Rail placement (outside): Bilateral rails  Number of Stairs: 4     Cognition   Orientation Level:  Oriented x 4   Arousal/Alertness:  Appropriate responses to stimuli   Attention Span:  Appears intact   Memory:  Appears intact   Following Commands:  Follows all commands and directions without difficulty   Safety Judgment:  Good awareness of safety precautions   Awareness of Errors:  Good awareness of safety precautions   Problem Solving:  Able to problem solve independently   Comments:      Vision / Perception    Hearing: WFL   Vision: No visual deficits  Perception: appeared intact       Hand Function  Hand Dominance: R  WFL Skin Inspection  all other visible skin appeared intact    ROM / Strength/Coordination  UE ROM/ Strength/ Coordination: BUE AROM/strength appeared grossly WFL  LE ROM/ Strength/ Coordination: BLE AROM/strength appeared grossly WFL; figure four acheived    Sensation:  pt endorsed baseline neuropathy of RLE    Balance:  supervision for dynamic sitting; SBA for dynamic standing    Functional Mobility  Transfer Assistance Needed: Yes  Transfers - Needs Assistance: Requires additional structure(SBA for sit<>stand without AD; CGA for mobility without AD simulating distances between ADL environments, pt demonstrating furniture walking, but not agreeable to utilizing AD)  Bed Mobility Assistance Needed: Yes  Bed Mobility - Needs Assistance: (supervision for sup with HOB elevated<>sitting EOB)      ADLs  ADLs: Needs assistance with ADLs  ADLs - Needs Assistance: Feeding, Grooming, Bathing, Toileting, UB dressing, LB dressing  Feeding - Needs Assistance: (NPO at time of evaluation)  Grooming - Needs Assistance: Requires additional structure(SBA for hand hygiene standing sink-side in bathroom)  Bathing - Needs Assistance: Requires additional structure(anticipated for seated bathing)  Toileting - Needs Assistance: Requires additional structure(SBA for toilet t/f in bathroom without AD; supervision for seated hygiene)  UB Dressing - Needs Assistance: Set Up Assist(anticipated)  LB Dressing - Needs Assistance: Requires additional structure(pt managed B) socks seated at EOB utilizing figure four)      Vitals / Orthostatics  At Rest: NAD  With Activity: NAD  Orthostatics: asymptomatic      Medical Staff Made Aware: RN      Occupational Therapy Session Duration  OT Individual - Duration: 18         I attest that I have reviewed the above information.  Signed: Danielle Dess, OT  Filed 05/04/2019

## 2019-05-04 NOTE — Unmapped (Addendum)
Michele Cisneros is a 63 y.o. female with a PMHx of CAD, HTN, COPD, and ESRD secondary to T2DM s/p DDKT on 03/30/19??who??presented to [facility]??with nausea, vomiting, and substernal chest pain.??    On arrival to the ED, she was hypertensive with vitals otherwise stable. She has been afebrile. Labs on admission notable for a leukocytosis to 11.1 and BNP of 2820 that has since increased to 3890. UA notable only for glucose and ketones; no evidence of infection. Because she was complaining of substernal pain and hypertensive, concern was raised for possible aortic dissection. Transplant Nephrology consulted for consideration of CT imaging with contrast, but suspicion of dissection overall low by Transplant Surgery who evaluated her, so CT non-contrast chosen. Imaging was nondiagnostic for aortic dissection, but no other acute pathology seen. CT abdomen/pelvis negative for pathology affecting transplanted kidney or biliary pathology. As there was no role for surgical intervention, decision was made to admit to medicine for supportive care and PO challenge given that patient would not be able to be seen in clinic this week should she feel worse. Her hospital course is as outlined below. Nausea - Vomiting: Onset after patient began experiencing severe burning mid-epigastric/substernal pain. ACS rule out in ED with CT non-contrast nondiagnostic of aortic dissection. Troponins negative. Workup negative for acute intra-abdominal process. Pain seems most consistent with GERD which has been worsening over past few weeks. Further history revealed trouble swallowing food and pills but not liquids. She continued to have burning pain when drinking water. GI was consulted for concern of sequela of GERD (stricture, esophagitis) or infectious etiologies given her immune suppression. EGD on 11/25 showed severe esophageal ulcerations. Biopsy was negative for HSV, CMV and was most consistent with severe esophagitis likely due to reflux. A biopsy of an ulcer in the stomach was negative for H. Pylori. For her ulcerations, she will be treated with aggressive symptom control to allow her ulcers to begin healing. During her hospitalization, Nutrition was consulted to help her advance her diet, and by the time of discharge, she was able to tolerate solids and liquids to keep herself properly hydrated. We counseled her on return precautions, emphasizing that if she was unable to tolerate oral intake, to call her physician as hydration is important to her transplant success.    ESRD s/p renal transplant:??ESRD secondary to DM, s/p kidney transplant on 10/21. Followed by [facility] Neph[facility]. Had not had any issues with transplanted kidney. Transplant meds include tacrolimus and Myfortic. Tac level on admission 8.3. Nephrology consulted on admission. Patient was able to stop Lasix. Continued all other home medications with Pharmacy dosing tacrolimus. Creatinine had increased during her stay, and it was thought that this was due to her inability to keep up with oral hydration. Tacrolimus dosing adjusted several times, but she will resume her 11mg  dose with follow-up one week after discharge with Michele Cisneros.

## 2019-05-04 NOTE — Unmapped (Signed)
PHYSICAL THERAPY  Evaluation(With Eliezer Champagne, OT) (05/04/19 0908)     Patient Name:  Michele Cisneros       Medical Record Number: [id number]   Date of Birth: [birth year]  Sex: Female            Treatment Diagnosis: gait instability, decreased activity tolerance    ASSESSMENT  Problem List: Impaired sensation, Decreased endurance, Impaired balance, Decreased mobility, Fall Risk     Assessment : Michele Cisneros is a 63 y.o. female with PMHx of CAD, HTN, COPD, and ESRD secondary to T2DM s/p DDKT on 03/30/19 who presented to [facility] with nausea, vomiting, and substernal chest pain. Pt presents to acute PT services with gait instability and decreased activity tolerance requiring contact guard assist to ambulate a short distance in the room while reaching out for support from the furniture. She will benefit from ongoing acute and 3x post acute PT services to address deficits. After a review of the personal factors, comorbidities, clinical presentation, and examination of the number of affected body systems, the patient presents as a moderate complexity case.     Today's Interventions: PT Eval, pt education re: PT role, POC, DME options for safety and increasing stability, staff assist, activity pacing          PLAN  Planned Frequency of Treatment:  1-2x per day for: 2-3x week      Planned Interventions: Balance activities, Education - Patient, Endurance activities, Functional mobility, Gait training, Self-care / Home training, Therapeutic activity, Neuromuscular re-education, Transfer training, Therapeutic exercise, Stair training, Home exercise program    Post-Discharge Physical Therapy Recommendations:  3x weekly    PT DME Recommendations: None(pt reported owning a cane)           Goals:   Patient and Family Goals: To increase her energy    Long Term Goal #1: Pt will score 24/24 on AMPAC in 4 weeks       SHORT GOAL #1: Pt will perform all transfers mod IND              Time Frame : 2 weeks SHORT GOAL #2: Pt will ambulate 150' SBA with LRAD              Time Frame : 2 weeks                                   Prognosis:  Good  Positive Indicators: caregiver support, age  Barriers to Discharge: Endurance deficits    SUBJECTIVE  Patient reports: Pt agreeable to PT although reported fatigue due to lack of eating  Current Functional Status: Session began and ended with pt semi-reclined in bed, needs in reach, RN cleared pt for PT     Prior Functional Status: Pt ambulates independently with no device, no falls. Her family assists with cooking and cleaning, her sister drives her to appointments.  Equipment available at home: Gilmer Mor     Past Medical History:   Diagnosis Date   ??? End stage renal disease (CMS-HCC) 11/08/2012   ??? ESRD on hemodialysis (CMS-HCC) 08/21/2011    Pt gets dialysis with [facility] in [place], [place], on a TTS schedule.  She started dialysis in Feb 2014.  Etiology of renal failure not known, likely diabetes.  Has a left upper arm AV graft as of Dec 2014.   On transplant list since 2015.  Dr. Cherylann Ratel RN [facility] [phone]  Last Assessment & Plan:  Stable. continues to follow with lateef. On kidney transplant list.   ??? Other and unspecified hyperlipidemia 11/08/2012   ??? Type II or unspecified type diabetes mellitus 11/08/2012   ??? Unspecified essential hypertension 11/08/2012    Social History     Tobacco Use   ??? Smoking status: Former Smoker     Packs/day: 0.25     Years: 30.00     Pack years: 7.50     Types: Cigarettes     Quit date: 05/09/2013     Years since quitting: 5.9   ??? Smokeless tobacco: Never Used   ??? Tobacco comment: 1 pack every week; she reports historical periods of abstinence   Substance Use Topics   ??? Alcohol use: Not Currently     Comment: very rare use (reports once over last year)      Past Surgical History:   Procedure Laterality Date   ??? AV FISTULA PLACEMENT     ??? HYSTERECTOMY  2000   ??? PR TRANSPLANTATION OF KIDNEY Left 03/30/2019 Procedure: RENAL ALLOTRANSPLANTATION, IMPLANTATION OF GRAFT; WITHOUT RECIPIENT NEPHRECTOMY;  Surgeon: Loney Hering, MD;  Location: MAIN OR [place];  Service: Transplant   ??? TOTAL ABDOMINAL HYSTERECTOMY W/ BILATERAL SALPINGOOPHORECTOMY  2000    Family History   Problem Relation Age of Onset   ??? Heart disease Mother    ??? Diabetes Mother    ??? Cancer Father    ??? Colon cancer Father 6   ??? Colon cancer Sister 36   ??? Breast cancer Sister         Allergies: Hydrocodone and Lisinopril                Objective Findings  Precautions / Restrictions  Precautions: Falls precautions, Protective precautions, Other precautions(no BLT)  Weight Bearing Status: Non-applicable  Required Braces or Orthoses: Non-applicable    Communication Preference: Verbal   Pain Comments: no c/o pain  Medical Tests / Procedures: chart reviewed  Equipment / Environment: Vascular access (PIV, TLC, Port-a-cath, PICC), Patient not wearing mask for full session    At Rest: VSS per epic  With Activity: NAD  Orthostatics: asymptomatic       Living Situation  Living Environment: House  Lives With: Son(sister has been staying with pt while son is out at work)  Home Living: One level home, Level entry, Stairs to enter with rails, Standard height toilet, Tub/shower unit(level entry in the back, STE in the front)  Rail placement (outside): Bilateral rails  Number of Stairs: 4     Cognition: A&O x4.  Visual / Perception Status: wears reading glasses       UE ROM: WFL  UE Strength: WFL  LE ROM: WFL  LE Strength: WFL                       Sensation: decreased sensation to light touch in entire RLE - pt reported this is baseline and she had planned to have back surgery prior to her kidney transplant but it has been postponed.  Balance: sitting IND, static standing SBA, dynamic standing CGA         Bed Mobility: supine <> sit mod IND  Transfers: sit <> stand SBA with instability upon standing but no hands on assist required   Gait Level of Assistance: Contact guard assist, steadying assist  Assistive Device: Other (Comment)(pt furniture walking, refused RW trial)  Distance Ambulated (ft): 24 ft  Gait: 6' x2 with CGA  due to instability. Pt furniture walking for support but declined DME trial today due to fatigue.  Stairs: Not assessed      Endurance: Pt reported little energy    Physical Therapy Session Duration  PT Individual - Duration: 21    Medical Staff Made Aware: RN Mardi Mainland    I attest that I have reviewed the above information.  Signed: Salome Holmes, PT  Filed 05/04/2019

## 2019-05-04 NOTE — Unmapped (Signed)
LUMINAL GASTROENTEROLOGY INPATIENT TREATMENT PLAN NOTE     Interval History:     - s/p EGD, see procedure tab for full results    Assessment:    Michele Cisneros is a 63yo female with PMH ESRD 2/2 DM2 s/p renal transplant (03/30/19), GERD, HTN, MI (2014) on ASA who presented with chest pain, nausea, and vomiting.   ??  Evaluation for cardiac etiologies of her chest pain has been unremarkable. Most bothersome symptom actually felt to be burning chest pain with eating, more consistent with a odynophagia.     EGD 11/25 showed several esophageal ulcers that were biopsied. There was also some food in the stomach that precluded complete evaluation of the stomach. However she did also have some erythematous gastric mucosa in the antrum with linear erosion. This was biopsied as well. The esophageal ulcers are likely the source of her symptoms.     Recommendations:    - f/u pathology results  - po PPI BID until GI follow up  - can give carafate QID if helps symptoms  - outpatient GI follow up has been requested    Thank you for this consult. The patient was Discussed with  Dr. Chales Abrahams. We will sign off at this time. Please page luminal fellow on call with questions.     Hyman Bible, MD  Gastroenterology & Hepatology Fellow, PGY-4  [facility]

## 2019-05-04 NOTE — Unmapped (Addendum)
VSS, pt reports burning pain in sternum/chest, especially with swallowing. PRN Oxy and GI meds given with decent relief. Pt attempted soft foods, was able to drink most of ensure. LR running at 72mL/hr.  Pt resting. No reports of nausea. Will continue to monitor.  PIV infiltrated, VAT paged to place new PIV.    Problem: Adult Inpatient Plan of Care  Goal: Plan of Care Review  Outcome: Ongoing - Unchanged  Goal: Patient-Specific Goal (Individualization)  Outcome: Ongoing - Unchanged  Goal: Absence of Hospital-Acquired Illness or Injury  Outcome: Ongoing - Unchanged  Goal: Optimal Comfort and Wellbeing  Outcome: Ongoing - Unchanged  Goal: Readiness for Transition of Care  Outcome: Ongoing - Unchanged  Goal: Rounds/Family Conference  Outcome: Ongoing - Unchanged     Problem: COPD Comorbidity  Goal: Maintenance of COPD Symptom Control  Outcome: Ongoing - Unchanged     Problem: Diabetes Comorbidity  Goal: Blood Glucose Level Within Desired Range  Outcome: Ongoing - Unchanged     Problem: Hypertension Comorbidity  Goal: Blood Pressure in Desired Range  Outcome: Ongoing - Unchanged     Problem: Wound  Goal: Optimal Wound Healing  Outcome: Ongoing - Unchanged

## 2019-05-04 NOTE — Unmapped (Signed)
Patient complains of heartburn and her symptoms were relieved with current PRN medications ordered. Left lower quadrant abdomen with intact staples at surgical site. She remained afebrile throughout the night. No acute distress noted. Will continue to monitor.  Problem: Adult Inpatient Plan of Care  Goal: Plan of Care Review  Outcome: Progressing  Goal: Patient-Specific Goal (Individualization)  Outcome: Progressing  Goal: Absence of Hospital-Acquired Illness or Injury  Outcome: Progressing  Goal: Optimal Comfort and Wellbeing  Outcome: Progressing  Goal: Readiness for Transition of Care  Outcome: Progressing  Goal: Rounds/Family Conference  Outcome: Progressing

## 2019-05-04 NOTE — Unmapped (Signed)
Order was placed for a PIV by Venous Access Team (VAT).  Patient was assessed at bedside for placement of a PIV. Mask and protective eyewear were donned. Access was obtained. Blood return noted.  Dressing intact and device well secured.  Flushed with normal saline.  Pt advised to inform RN of any s/s of discomfort at the PIV site.    Workup / Procedure Time:  15 minutes        RN was notified.       Thank you,     Adrian Prows RN Venous Access Team

## 2019-05-04 NOTE — Unmapped (Signed)
Internal Medicine ([facility]) Progress Note    Assessment & Plan:   Michele Cisneros is a 63 y.o. female with a PMHx of CAD, HTN, COPD, and ESRD secondary to T2DM s/p DDKT on 03/30/19 who presented to [facility] with nausea, vomiting, and substernal chest pain. .    Principal Problem:    GERD (gastroesophageal reflux disease)  Active Problems:    Status post deceased-donor kidney transplantation    Essential hypertension    COPD (chronic obstructive pulmonary disease) (CMS-HCC)    Coronary artery disease of native artery of native heart with stable angina pectoris (CMS-HCC)  Resolved Problems:    * No resolved hospital problems. *    Nausea - Vomiting: Onset after patient began experiencing severe burning mid-epigastric/substernal pain. ACS rule out in ED with CT non-contrast nondiagnostic of aortic dissection. Troponins negative. Workup negative for acute intra-abdominal process. Pain seems most consistent with GERD which has been worsening over past few weeks. Further history reveals trouble swallowing food and pills but not liquids. GI consulted. Pain improving but diet still limited to liquids.  - EGD today to evaluate esophagitis  - Pantoprazole 40mg  IV BID  - GI cocktail prn  - Famotidine prn  - Acetaminophen for pain  - Home pregabalin  ??  ESRD s/p renal transplant: ESRD secondary to DM, s/p kidney transplant on 10/21. Followed by [facility]. No issues with transplanted kidney. Transplant meds include tacrolimus and Myfortic. Tac level on admission 8.3. Nephrology consulted on admission.  - Pharmacy consult for tacrolimus dosing  - Continue tacrolimus and Myfortic  - Valtrex and Bactrim prophylaxis  - Avoid nephrotoxins and renally dose medications  ??  Chronic medical problems:  Hypertension: Noted to be hypertensive on last clinic note. Continue amlodipine, clonidine  COPD: Duonebs prn  CAD: Continue carvedilol  DM: SSI    Daily Checklist:  Diet: Regular Diet  DVT PPx: Heparin 5000units q8h Electrolytes: Replete Potassium to >/= 3.6 and Magnesium to >/= 1.8  Code Status: Code with Limitations  Dispo: Continue floor care    Subjective:   No acute events overnight. Pain improved but still present when drinking liquids    Objective:   Temp:  [36 ??C-36.4 ??C] 36 ??C  Heart Rate:  [70-82] 73  Resp:  [18-20] 20  BP: (125-166)/(61-73) 136/64  SpO2:  [93 %-99 %] 93 %    Gen: NAD, alert, oriented, answers questions appropriately  HEENT: atraumatic, sclera anicteric, MMM. OP w/o erythema or exudate   Heart: RRR  Lungs: CTAB, no use of accessory muscles  Abdomen: Normoactive bowel sounds, abdomen tender to palpation  Extremities: no clubbing, cyanosis, or edema  Psych: Appropriate mood and affect    Labs/Studies: Labs and Studies from the last 24hrs per EMR and Reviewed

## 2019-05-05 DIAGNOSIS — Z940 Kidney transplant status: Principal | ICD-10-CM

## 2019-05-05 DIAGNOSIS — Z79899 Other long term (current) drug therapy: Principal | ICD-10-CM

## 2019-05-05 LAB — CBC
HEMATOCRIT: 25 % — ABNORMAL LOW (ref 36.0–46.0)
HEMOGLOBIN: 8.2 g/dL — ABNORMAL LOW (ref 12.0–16.0)
MEAN CORPUSCULAR HEMOGLOBIN CONC: 33 g/dL (ref 31.0–37.0)
MEAN CORPUSCULAR HEMOGLOBIN: 32.7 pg (ref 26.0–34.0)
MEAN CORPUSCULAR VOLUME: 99.1 fL (ref 80.0–100.0)
PLATELET COUNT: 160 10*9/L (ref 150–440)
RED BLOOD CELL COUNT: 2.52 10*12/L — ABNORMAL LOW (ref 4.00–5.20)
RED CELL DISTRIBUTION WIDTH: 17.5 % — ABNORMAL HIGH (ref 12.0–15.0)
WBC ADJUSTED: 4.8 10*9/L (ref 4.5–11.0)

## 2019-05-05 LAB — COMPREHENSIVE METABOLIC PANEL
ALBUMIN: 2.9 g/dL — ABNORMAL LOW (ref 3.5–5.0)
ALT (SGPT): 8 U/L (ref <35)
ANION GAP: 4 mmol/L — ABNORMAL LOW (ref 7–15)
AST (SGOT): 15 U/L (ref 14–38)
BILIRUBIN TOTAL: 0.4 mg/dL (ref 0.0–1.2)
BLOOD UREA NITROGEN: 12 mg/dL (ref 7–21)
BUN / CREAT RATIO: 10
CALCIUM: 9.7 mg/dL (ref 8.5–10.2)
CHLORIDE: 107 mmol/L (ref 98–107)
CO2: 24 mmol/L (ref 22.0–30.0)
CREATININE: 1.22 mg/dL — ABNORMAL HIGH (ref 0.60–1.00)
EGFR CKD-EPI AA FEMALE: 55 mL/min/{1.73_m2} — ABNORMAL LOW (ref >=60)
EGFR CKD-EPI NON-AA FEMALE: 48 mL/min/{1.73_m2} — ABNORMAL LOW (ref >=60)
GLUCOSE RANDOM: 126 mg/dL (ref 70–179)
PROTEIN TOTAL: 5.2 g/dL — ABNORMAL LOW (ref 6.5–8.3)
SODIUM: 135 mmol/L (ref 135–145)

## 2019-05-05 LAB — PHOSPHORUS
PHOSPHORUS: 3.2 mg/dL (ref 2.9–4.7)
Phosphate:MCnc:Pt:Ser/Plas:Qn:: 3.2

## 2019-05-05 LAB — PLATELET COUNT: Platelets:NCnc:Pt:Bld:Qn:Automated count: 160

## 2019-05-05 LAB — TACROLIMUS, TROUGH: Lab: 5.9

## 2019-05-05 LAB — MAGNESIUM: Magnesium:MCnc:Pt:Ser/Plas:Qn:: 1.7

## 2019-05-05 LAB — POTASSIUM: Potassium:SCnc:Pt:Ser/Plas:Qn:: 4.4

## 2019-05-05 NOTE — Unmapped (Signed)
This SW engaged in secure chat and phone conversations with Randa Lynn, MD and Jarrett Soho, MD. Team noted that patient would discharge tomorrow at the earliest, but more likely on Friday. No other concerns were addressed at this time.    Gabriela Eves, LCSWA

## 2019-05-05 NOTE — Unmapped (Signed)
Patient complained of indigestion/heartburn after she ate her dinner, given PRN medications as ordered. Patient is concerned that she might be discharged before her pain is resolved. No acute distress noted. Will continue to monitor.   Problem: Adult Inpatient Plan of Care  Goal: Plan of Care Review  Outcome: Progressing  Goal: Patient-Specific Goal (Individualization)  Outcome: Progressing  Goal: Absence of Hospital-Acquired Illness or Injury  Outcome: Progressing  Goal: Optimal Comfort and Wellbeing  Outcome: Progressing  Goal: Readiness for Transition of Care  Outcome: Progressing  Goal: Rounds/Family Conference  Outcome: Progressing

## 2019-05-05 NOTE — Unmapped (Signed)
VSS, pt AO x 4. C/o abdominal/chest heartburn when eating/drinking. Pt tolerated upper endoscopy well and returned from procedure with some pain that responded well to PRN oxycodone. POC pending results of bx. Pt reports her appetite is returning late this afternoon/evening, soup ordered. Has not required SSI this shift. WCTM.

## 2019-05-05 NOTE — Unmapped (Signed)
Aox4, VSS, afebrile, & free of falls/injuries during shift. Continuous N/V- provider notified, zofran given x2, & timed zofran ordered. Maalox max given x1 w/good results. Suppository placed- awaiting results. Oxy given x2 w/good results. KUB completed. Will continue to monitor.     Problem: Adult Inpatient Plan of Care  Goal: Plan of Care Review  Outcome: Ongoing - Unchanged  Goal: Patient-Specific Goal (Individualization)  Outcome: Ongoing - Unchanged  Goal: Absence of Hospital-Acquired Illness or Injury  Outcome: Ongoing - Unchanged  Goal: Optimal Comfort and Wellbeing  Outcome: Ongoing - Unchanged  Goal: Readiness for Transition of Care  Outcome: Ongoing - Unchanged  Goal: Rounds/Family Conference  Outcome: Ongoing - Unchanged     Problem: COPD Comorbidity  Goal: Maintenance of COPD Symptom Control  Outcome: Ongoing - Unchanged     Problem: Diabetes Comorbidity  Goal: Blood Glucose Level Within Desired Range  Outcome: Ongoing - Unchanged     Problem: Hypertension Comorbidity  Goal: Blood Pressure in Desired Range  Outcome: Ongoing - Unchanged     Problem: Wound  Goal: Optimal Wound Healing  Outcome: Ongoing - Unchanged     Problem: Self-Care Deficit  Goal: Improved Ability to Complete Activities of Daily Living  Outcome: Ongoing - Unchanged

## 2019-05-05 NOTE — Unmapped (Addendum)
The below services (PT/OT) have been ordered for you by your medical team to help your health and safety at home.    If Home Health services have been arranged, the agency listed below will be contacting you to set up a time for them to come see you in your home within 2 days of your discharge.  If you have not heard from them prior to 05/06/19 or you have any questions about home health, please contact them at the phone number listed below.        Selected Continued Care - Discharged on 05/10/2019 Admission date: 05/01/2019 - Discharge disposition: Home with Self Care    [facility] Care Coordination complete    Service Provider Selected Services Address Phone Fax Patient Preferred    [facility] ??[address] [place], [place] [address] [phone]                 Please contact your Post-Transplant Coordinator if you have any problems/concerns:  Philis Nettle [phone]; [phone]

## 2019-05-05 NOTE — Unmapped (Signed)
Internal Medicine ([facility]) Progress Note    Assessment & Plan:   Michele Cisneros is a 63 y.o. female with a PMHx of CAD, HTN, COPD, and ESRD secondary to T2DM s/p DDKT on 03/30/19 who presented to [facility] with nausea, vomiting, and substernal chest pain. .    Principal Problem:    GERD (gastroesophageal reflux disease)  Active Problems:    Status post deceased-donor kidney transplantation    Essential hypertension    COPD (chronic obstructive pulmonary disease) (CMS-HCC)    Coronary artery disease of native artery of native heart with stable angina pectoris (CMS-HCC)    Esophageal ulceration  Resolved Problems:    * No resolved hospital problems. *    Nausea - Vomiting: Onset after patient began experiencing severe burning mid-epigastric/substernal pain. Negative ACS. Workup negative for acute intra-abdominal process. Pain seems most consistent with GERD which has been worsening over past few weeks. Further history reveals trouble swallowing food and pills but not liquids. GI consulted. Pain improving but diet still limited to liquids. EGD showed ulcers, biopsies taken. CMV PCR on biopsy negative. Will await path results to help guide treatment.  - Follow-up pathology  - Pantoprazole 40mg  IV BID  - Diet as tolerated  - IV zofran   - GI cocktail prn  - Famotidine prn  - Acetaminophen for pain  - Home pregabalin  ??  ESRD s/p renal transplant: ESRD secondary to DM, s/p kidney transplant on 10/21. Followed by [facility]. No issues with transplanted kidney. Transplant meds include tacrolimus and Myfortic. Tac level on admission 8.3. Nephrology consulted on admission.  - Pharmacy consult for tacrolimus dosing  - Continue tacrolimus and Myfortic  - Valtrex and Bactrim prophylaxis  - Avoid nephrotoxins and renally dose medications  ??  Chronic medical problems:  Hypertension: Noted to be hypertensive on last clinic note. Continue amlodipine, clonidine  COPD: Duonebs prn  CAD: Continue carvedilol  DM: SSI    Daily Checklist: Diet: Regular Diet  DVT PPx: Heparin 5000units q8h  Electrolytes: Replete Potassium to >/= 3.6 and Magnesium to >/= 1.8  Code Status: Code with Limitations  Dispo: Continue floor care    Subjective:   No acute events overnight. Able to eat dinner but having vomiting this morning.    Objective:   Temp:  [36 ??C-36.6 ??C] 36.3 ??C  Heart Rate:  [68-82] 70  SpO2 Pulse:  [82] 82  Resp:  [18-24] 18  BP: (123-197)/(48-79) 146/65  SpO2:  [96 %-99 %] 97 %    Gen: NAD, alert, oriented, answers questions appropriately  HEENT: atraumatic, sclera anicteric, MMM. OP w/o erythema or exudate   Heart: RRR  Lungs: CTAB, no use of accessory muscles  Abdomen: Normoactive bowel sounds, abdomen tender to palpation  Extremities: no clubbing, cyanosis, or edema  Psych: Appropriate mood and affect    Labs/Studies: Labs and Studies from the last 24hrs per EMR and Reviewed

## 2019-05-05 NOTE — Unmapped (Signed)
Transplant Nephrology Consult Note    Assessment/Recommendations: [name] is a 63 y.o. female w/ PMH of ESRD s/pDBD kidney transplant on 03/30/19, HTN, DMT2, hx of MI in 2014 who presented to [facility] with significant abdominal pain, nausea, and significant vomiting. We have been consulted for renal transplantation status, immunosuppression.    Renal transplant, immunosuppression DBD kidney transplant on 03/30/19  - continue myfortic 540 mg BID  - Hold envarsus 11 mg and start sub-lingual tac 2mg  BID for now.  - please check daily tacrolimus troughs just prior to morning dose; we will monitor levels and suggest dose adjustments if needed.    - avoid nephrotoxins including NSAIDs and contrast agents  -Prophylaxis   - bactrim MWF   - Valcyte 450 daily    Abdominal pain/vomiting/nausea: EGD 11/25 w/ several esophageal ulcers, linear erythematous erosions in gastric mucosa. CMV PCR negative.   - the urine tox is positive for cannabinoid so cannabis hyperemesis syndrome is a possibility.  - cont Pantoprazole, would change to IV route given persistent vomiting  - Would obtain KUB and have low threshold for suppository/enema if evidence of constipation  - Could trial IV phenergan if persistently nauseated with IV zofran  - further work up and management per primary    HTN:   - amlodipine 10 mg daily  - carvedilol 37.5 mg PO BID  - clonidine 0.2 mg PO Q8h    Thank you for this consult.  Nephrology will continue to follow along with the primary team.  Please page the renal fellow on call with questions/concerns.    Patient was seen and evaluated by Dr. Cherly Hensen who agrees with this assessment and plan.     Requesting Attending Physician :  Erlinda Hong,*  Service Requesting Consult : [facility] ([facility])    Reason for Consult: renal transplant status, immunosuppression    Transplant history:  Date of transplant: 03/30/19  Transplant Nephrologist/Coordinator: Philis Nettle Current immunosuppressants: tacrolimus and mycophenolate    Interval History: [name] is a 63 y.o. female w/ PMH of ESRD s/p DBD kidney transplant on 03/30/19, HTN, DMT2, hx of MI in 2014 who presented to [facility] with chest pain, nausea and vomiting.       She reports ongoing nausea and vomiting. She reports she vomited shortly after getting her morning medications today. She reports she also became nauseated after getting an oral dissolving tablet yesterday. She can't remember urinating today (this interview occurred at 2:00 PM). She reports her last BM was on Sunday.     Medications:     Current Facility-Administered Medications:   ???  acetaminophen (TYLENOL) tablet 500 mg, 500 mg, Oral, Q6H PRN, Jen Mow, MD, 500 mg at 05/03/19 2225  ???  sucralfate (CARAFATE) oral suspension, 1 g, Oral, BID PRN, 1 g at 05/05/19 0255 **AND** lidocaine (XYLOCAINE) 2% viscous mucosal solution, 10 mL, Oral, BID PRN, 10 mL at 05/05/19 0255 **AND** aluminum-magnesium hydroxide-simethicone (MAALOX MAX) 80-80-8 mg/mL oral suspension, 30 mL, Oral, BID PRN, Bennye Alm, MD, 30 mL at 05/05/19 0254  ???  amLODIPine (NORVASC) tablet 10 mg, 10 mg, Oral, Daily, Jen Mow, MD, 10 mg at 05/05/19 7829  ???  aspirin chewable tablet 81 mg, 81 mg, Oral, Daily, Jen Mow, MD, 81 mg at 05/05/19 5621  ???  atorvastatin (LIPITOR) tablet 40 mg, 40 mg, Oral, Daily, Jen Mow, MD, 40 mg at 05/05/19 0817  ???  carvediloL (COREG) tablet 37.5 mg, 37.5  mg, Oral, BID, Jen Mow, MD, 37.5 mg at 05/05/19 4098  ???  cloNIDine HCL (CATAPRES) tablet 0.2 mg, 0.2 mg, Oral, Q8H, Jen Mow, MD, 0.2 mg at 05/05/19 1058  ???  dextrose 50 % in water (D50W) 50 % solution 12.5 g, 12.5 g, Intravenous, Q10 Min PRN, Bennye Alm, MD  ???  donepeziL (ARICEPT) tablet 5 mg, 5 mg, Oral, Nightly, Jen Mow, MD, 5 mg at 05/04/19 2012 ???  heparin (porcine) injection 5,000 Units, 5,000 Units, Subcutaneous, Q8H SCH, Jen Mow, MD, 5,000 Units at 05/05/19 1191  ???  insulin lispro (HumaLOG) injection 0-12 Units, 0-12 Units, Subcutaneous, ACHS, Tiffany D Long, MD, 2 Units at 05/05/19 1113  ???  ipratropium-albuteroL (DUO-NEB) 0.5-2.5 mg/3 mL nebulizer solution 3 mL, 3 mL, Nebulization, Q6H PRN, Jen Mow, MD  ???  isosorbide mononitrate (IMDUR) 24 hr tablet 60 mg, 60 mg, Oral, Daily, Jen Mow, MD, 60 mg at 05/05/19 0811  ???  lactated Ringers infusion, 75 mL/hr, Intravenous, Continuous, Jen Mow, MD, Last Rate: 75 mL/hr at 05/05/19 0515, 75 mL/hr at 05/05/19 0515  ???  lactated Ringers infusion, 10 mL/hr, Intravenous, Continuous, Beverly Milch, MD, Last Rate: 10 mL/hr at 05/04/19 1258, 10 mL/hr at 05/04/19 1258  ???  mycophenolate (MYFORTIC) EC tablet 540 mg, 540 mg, Oral, BID, Jen Mow, MD, 540 mg at 05/05/19 0827  ???  ondansetron (ZOFRAN) injection 4 mg, 4 mg, Intravenous, Q6H, Jen Mow, MD  ???  oxyCODONE (ROXICODONE) immediate release tablet 5 mg, 5 mg, Oral, Q6H PRN, 5 mg at 05/03/19 2226 **OR** oxyCODONE (ROXICODONE) immediate release tablet 10 mg, 10 mg, Oral, Q6H PRN, Bennye Alm, MD, 10 mg at 05/05/19 4782  ???  pantoprazole (PROTONIX) injection 40 mg, 40 mg, Intravenous, BID, Jen Mow, MD  ???  pregabalin (LYRICA) capsule 75 mg, 75 mg, Oral, BID, Jen Mow, MD, 75 mg at 05/05/19 9562  ???  promethazine (PHENERGAN) 12.5 mg in sodium chloride (NS) 0.9 % 25 mL infusion, 12.5 mg, Intravenous, Q6H PRN, Jen Mow, MD  ???  senna (SENOKOT) tablet 1 tablet, 1 tablet, Oral, Nightly PRN, Jen Mow, MD  ???  sucralfate (CARAFATE) oral suspension, 1 g, Oral, Q6H SCH, Tiffany D Long, MD, Stopped at 05/05/19 1249  ???  sulfamethoxazole-trimethoprim (BACTRIM) 400-80 mg tablet 80 mg of trimethoprim, 1 tablet, Oral, Q MWF, Jen Mow, MD, 80 mg of trimethoprim at 05/04/19 1308 ???  tacrolimus (ENVARSUS XR) extended release tablet 11 mg, 11 mg, Oral, Daily, Jen Mow, MD, 11 mg at 05/05/19 0827  ???  valGANciclovir (VALCYTE) tablet 450 mg, 450 mg, Oral, Daily, Jen Mow, MD, 450 mg at 05/05/19 6578     ALLERGIES  Hydrocodone and Lisinopril    MEDICAL HISTORY  Past Medical History:   Diagnosis Date   ??? End stage renal disease (CMS-HCC) 11/08/2012   ??? ESRD on hemodialysis (CMS-HCC) 08/21/2011    Pt gets dialysis with [facility] in [address], [place], on a TTS schedule.  She started dialysis in Feb 2014.  Etiology of renal failure not known, likely diabetes.  Has a left upper arm AV graft as of Dec 2014.   On transplant list since 2015.  Dr. Cherylann Ratel RN Annabelle Harman [phone]  Last Assessment & Plan:  Stable. continues to follow with lateef. On kidney transplant list.   ??? Other and unspecified hyperlipidemia 11/08/2012   ??? Type II or unspecified type diabetes  mellitus 11/08/2012   ??? Unspecified essential hypertension 11/08/2012        SOCIAL HISTORY  Social History     Socioeconomic History   ??? Marital status: Widowed     Spouse name: None   ??? Number of children: None   ??? Years of education: None   ??? Highest education level: None   Occupational History   ??? None   Social Needs   ??? Financial resource strain: Not hard at all   ??? Food insecurity     Worry: Never true     Inability: Never true   ??? Transportation needs     Medical: No     Non-medical: No   Tobacco Use   ??? Smoking status: Former Smoker     Packs/day: 0.25     Years: 30.00     Pack years: 7.50     Types: Cigarettes     Quit date: 05/09/2013     Years since quitting: 5.9   ??? Smokeless tobacco: Never Used   ??? Tobacco comment: 1 pack every week; she reports historical periods of abstinence   Substance and Sexual Activity   ??? Alcohol use: Not Currently     Comment: very rare use (reports once over last year)   ??? Drug use: No   ??? Sexual activity: None   Lifestyle   ??? Physical activity     Days per week: 0 days     Minutes per session: 0 min ??? Stress: Not at all   Relationships   ??? Social connections     Talks on phone: More than three times a week     Gets together: More than three times a week     Attends religious service: Never     Active member of club or organization: No     Attends meetings of clubs or organizations: Never     Relationship status: Widowed   Other Topics Concern   ??? None   Social History Narrative   ??? None        FAMILY HISTORY  Family History   Problem Relation Age of Onset   ??? Heart disease Mother    ??? Diabetes Mother    ??? Cancer Father    ??? Colon cancer Father 70   ??? Colon cancer Sister 47   ??? Breast cancer Sister         Code Status:  Code with Limitations    Review of Systems:  A 12 system review of systems was negative except as noted in HPI.      Physical Exam:  Vitals:    05/05/19 1240   BP:    Pulse:    Resp:    Temp: 36.3 ??C   SpO2:      No intake/output data recorded.    Intake/Output Summary (Last 24 hours) at 05/05/2019 1421  Last data filed at 05/05/2019 1250  Gross per 24 hour   Intake 1810 ml   Output ???   Net 1810 ml       General: ill-appearing, mildly uncomfortably  HEENT: anicteric sclera, MMM  CV: RRR, no m/r/g, no edema  Pulm: CTAB, normal wob  GI: soft, non-tender including over graft site, non-distended  MSK: no visible joint swelling  Skin: nl skin turgor on dorsum of hands BL  Psych: alert, engaged, appropriate mood and affect    LABS    Creatinine Trend  Lab Results   Component Value Date  Creatinine 1.22 (H) 05/05/2019    Creatinine 1.12 (H) 05/04/2019    Creatinine 1.06 (H) 05/03/2019    Creatinine 1.02 (H) 05/01/2019    Creatinine 1.31 (H) 04/29/2019    Creatinine 1.19 (H) 04/27/2019    Creatinine 1.15 (H) 04/25/2019    Creatinine 1.28 (H) 04/22/2019    Creatinine 1.21 (H) 04/20/2019    Creatinine 1.35 (H) 04/18/2019    Creatinine 1.46 (H) 04/11/2019    Creatinine 2.25 (H) 04/08/2019    Creatinine 3.51 (H) 04/06/2019    Creatinine 5.10 (H) 04/05/2019    Creatinine 6.33 (H) 04/04/2019 Creatinine 6.76 (H) 04/03/2019    Creatinine 7.50 (H) 04/02/2019    Creatinine 7.82 (H) 04/01/2019    Creatinine 7.68 (H) 03/31/2019    Creatinine 7.57 (H) 03/30/2019    Creatinine 7.54 (H) 03/30/2019    Creatinine 11.70 (H) 03/28/2019    Creatinine 8.14 (H) 07/07/2017    Creatinine 7.79 (H) 04/24/2016    Creatinine 8.57 (H) 04/20/2014    Creatinine 3.79 (H) 09/15/2012        Lab Results   Component Value Date    Color, UA Light Yellow 05/02/2019    Color, UA Yellow 04/20/2019    Specific Gravity, UA 1.013 05/02/2019    Specific Gravity, UA 1.009 04/20/2019    pH, UA 5.0 05/02/2019    pH, UA 6.0 04/20/2019    Protein, UA Negative 05/02/2019    Protein, UA Negative 04/20/2019    Glucose, UA 150 mg/dL (A) 02/72/5366    Glucose, UA Negative 04/20/2019    Ketones, UA 80 mg/dL (A) 44/08/4740    Ketones, UA Negative 04/20/2019    Blood, UA Small (A) 05/02/2019    Blood, UA 1+ (A) 04/20/2019    Nitrite, UA Negative 05/02/2019    Nitrite, UA Negative 04/20/2019    Leukocyte Esterase, UA Trace (A) 05/02/2019    Leukocyte Esterase, UA Trace (A) 04/20/2019    Urobilinogen, UA 0.2 mg/dL 59/56/3875    Urobilinogen, UA 0.2 04/20/2019    Bilirubin, UA Negative 05/02/2019    Bilirubin, UA Negative 04/20/2019    WBC, UA 0-5 04/20/2019    RBC, UA 3-10 (A) 04/20/2019       Lab Results   Component Value Date    Creatinine, Ur 112.6 04/08/2019    Creat U 106.0 04/25/2019    Protein/Creatinine Ratio, Urine 0.165 04/25/2019    Protein/Creat Ratio 472 (H) 04/08/2019        No results found for: NAPA, NAUR     Lab Results   Component Value Date    NA 135 05/05/2019    K 4.4 05/05/2019    CL 107 05/05/2019    CO2 24.0 05/05/2019    BUN 12 05/05/2019    CREATININE 1.22 (H) 05/05/2019      Lab Results   Component Value Date    CALCIUM 9.7 05/05/2019    MG 1.7 05/05/2019    PHOS 3.2 05/05/2019    ALBUMIN 2.9 (L) 05/05/2019      Lab Results   Component Value Date    WBC 4.8 05/05/2019    HGB 8.2 (L) 05/05/2019    HCT 25.0 (L) 05/05/2019 PLT 160 05/05/2019        IMAGING STUDIES: Reviewed

## 2019-05-06 LAB — BASIC METABOLIC PANEL
ANION GAP: 7 mmol/L (ref 7–15)
BLOOD UREA NITROGEN: 9 mg/dL (ref 7–21)
CALCIUM: 9.8 mg/dL (ref 8.5–10.2)
CHLORIDE: 106 mmol/L (ref 98–107)
CO2: 24 mmol/L (ref 22.0–30.0)
CREATININE: 1.13 mg/dL — ABNORMAL HIGH (ref 0.60–1.00)
EGFR CKD-EPI AA FEMALE: 61 mL/min/{1.73_m2} (ref >=60)
EGFR CKD-EPI NON-AA FEMALE: 53 mL/min/{1.73_m2} — ABNORMAL LOW (ref >=60)
GLUCOSE RANDOM: 143 mg/dL (ref 70–179)
POTASSIUM: 4.2 mmol/L (ref 3.5–5.0)
SODIUM: 137 mmol/L (ref 135–145)

## 2019-05-06 LAB — COMPREHENSIVE METABOLIC PANEL
ALBUMIN: 2 g/dL — ABNORMAL LOW (ref 3.5–5.0)
ALT (SGPT): 6 U/L (ref <35)
ANION GAP: 8 mmol/L (ref 7–15)
AST (SGOT): 13 U/L — ABNORMAL LOW (ref 14–38)
BILIRUBIN TOTAL: 0.3 mg/dL (ref 0.0–1.2)
BLOOD UREA NITROGEN: 6 mg/dL — ABNORMAL LOW (ref 7–21)
BUN / CREAT RATIO: 8
CALCIUM: 8.6 mg/dL (ref 8.5–10.2)
CHLORIDE: 107 mmol/L (ref 98–107)
CO2: 17 mmol/L — ABNORMAL LOW (ref 22.0–30.0)
CREATININE: 0.73 mg/dL (ref 0.60–1.00)
EGFR CKD-EPI AA FEMALE: >90 mL/min/{1.73_m2} (ref >=60)
EGFR CKD-EPI NON-AA FEMALE: 89 mL/min/{1.73_m2} (ref >=60)
GLUCOSE RANDOM: 91 mg/dL (ref 70–179)
POTASSIUM: 4.3 mmol/L (ref 3.5–5.0)
PROTEIN TOTAL: 3.8 g/dL — ABNORMAL LOW (ref 6.5–8.3)

## 2019-05-06 LAB — TACROLIMUS, TROUGH: Lab: 7.6

## 2019-05-06 LAB — BUN / CREAT RATIO: Urea nitrogen/Creatinine:MRto:Pt:Ser/Plas:Qn:: 8

## 2019-05-06 LAB — EGFR CKD-EPI AA FEMALE: Lab: >90

## 2019-05-06 LAB — MAGNESIUM: Magnesium:MCnc:Pt:Ser/Plas:Qn:: 1.5 — ABNORMAL LOW

## 2019-05-06 LAB — PHOSPHORUS: Phosphate:MCnc:Pt:Ser/Plas:Qn:: 2.3 — ABNORMAL LOW

## 2019-05-06 LAB — CORTISOL TOTAL: Cortisol:MCnc:Pt:Ser/Plas:Qn:: 11.6

## 2019-05-06 NOTE — Unmapped (Signed)
Pt is A&O x 4. Pt is afebrile w/ stable VS. Pt has active bowels and good urine output. Last BM was 05/02/2019. Pt received suppository at end of day shift 11/26, o/n Pt received miralax. Pt has not had BM presently. Pt struggled with N/V, received PRN phengergan piggy back and stated that provided relief. Pt was able to rest some over night. Still waiting on biopsy results. No s/s of infections. Pt c/o of throat Pain and PRN medication of oxy Was given 2x during shift. WCTM.        Problem: Adult Inpatient Plan of Care  Goal: Plan of Care Review  Outcome: Ongoing - Unchanged  Goal: Patient-Specific Goal (Individualization)  Outcome: Ongoing - Unchanged  Goal: Absence of Hospital-Acquired Illness or Injury  Outcome: Progressing  Goal: Optimal Comfort and Wellbeing  Outcome: Ongoing - Unchanged  Goal: Readiness for Transition of Care  Outcome: Progressing     Problem: Diabetes Comorbidity  Goal: Blood Glucose Level Within Desired Range  Outcome: Progressing     Problem: Hypertension Comorbidity  Goal: Blood Pressure in Desired Range  Outcome: Progressing     Problem: Wound  Goal: Optimal Wound Healing  Outcome: Progressing     Problem: Self-Care Deficit  Goal: Improved Ability to Complete Activities of Daily Living  Outcome: Progressing

## 2019-05-06 NOTE — Unmapped (Signed)
Internal Medicine ([facility]) Progress Note    Assessment & Plan:   Michele Cisneros is a 63 y.o. female with a PMHx of CAD, HTN, COPD, and ESRD secondary to T2DM s/p DDKT on 03/30/19 who presented to [facility] with nausea, vomiting, and substernal chest pain. .    Principal Problem:    GERD (gastroesophageal reflux disease)  Active Problems:    Status post deceased-donor kidney transplantation    Essential hypertension    COPD (chronic obstructive pulmonary disease) (CMS-HCC)    Coronary artery disease of native artery of native heart with stable angina pectoris (CMS-HCC)    Esophageal ulceration  Resolved Problems:    * No resolved hospital problems. *    Nausea - Vomiting: Onset after patient began experiencing severe burning mid-epigastric/substernal pain. Negative ACS. Workup negative for acute intra-abdominal process. Pain seems most consistent with GERD which has been worsening over past few weeks. Further history reveals trouble swallowing food and pills but not liquids. GI consulted. EGD showed ulcers, biopsies taken. CMV PCR on biopsy negative. Will await path results to help guide treatment. Possible that constipation could have been contributing to nausea. Symptoms improving this morning.   - Follow-up pathology  - Pantoprazole 40mg  IV BID  - Sucralfate  - Schedule miralax and senna  - Diet as tolerated  - PRN meds: phenergan, GI cocktail, famotidine  ??  ESRD s/p renal transplant: ESRD secondary to DM, s/p kidney transplant on 10/21. Followed by [facility]. No issues with transplanted kidney. Transplant meds include tacrolimus and Myfortic. Tac level on admission 8.3. Nephrology consulted on admission. Changed tac delivery.  - Nephrology following for tacrolimus  - Continue Myfortic  - Valtrex and Bactrim prophylaxis  - Avoid nephrotoxins and renally dose medications  ??  Chronic medical problems:  Hypertension: Noted to be hypertensive on last clinic note. Continue home meds  COPD: Duonebs prn CAD: Continue carvedilol  DM: SSI    Daily Checklist:  Diet: Regular Diet  DVT PPx: Heparin 5000units q8h  Electrolytes: Replete Potassium to >/= 3.6 and Magnesium to >/= 1.8  Code Status: Code with Limitations  Dispo: Continue floor care    Subjective:   No acute events overnight. Vomiting improved. Still has heartburn    Objective:   Temp:  [36.3 ??C-36.7 ??C] 36.7 ??C  Heart Rate:  [71-74] 71  Resp:  [17-18] 17  BP: (157-195)/(60-77) 157/70  SpO2:  [95 %-97 %] 95 %    Gen: NAD, alert, oriented, answers questions appropriately  HEENT: atraumatic, sclera anicteric, MMM. OP w/o erythema or exudate   Heart: RRR  Lungs: CTAB, no use of accessory muscles  Abdomen: Normoactive bowel sounds, abdomen tender to palpation  Extremities: no clubbing, cyanosis, or edema  Psych: Appropriate mood and affect    Labs/Studies: Labs and Studies from the last 24hrs per EMR and Reviewed

## 2019-05-06 NOTE — Unmapped (Signed)
Transplant Nephrology Consult Note    Assessment/Recommendations: [name] is a 63 y.o. female w/ PMH of ESRD s/pDBD kidney transplant on 03/30/19, HTN, DMT2, hx of MI in 2014 who presented to [facility] with significant abdominal pain, nausea, and significant vomiting. We have been consulted for renal transplantation status, immunosuppression.    Renal transplant, immunosuppression DBD kidney transplant on 03/30/19. Tac trough slightly up this AM but concern sample was diluted given results of concomitant BMP.   - continue myfortic 540 mg BID  - Resume envarsus 11 mg tomorrow given improvement in nausea  - please check daily tacrolimus troughs just prior to morning dose; we will monitor levels and suggest dose adjustments if needed.    - avoid nephrotoxins including NSAIDs and contrast agents  -Prophylaxis   - bactrim MWF   - Valcyte 450 daily    Abdominal pain/vomiting/nausea: EGD 11/25 w/ several esophageal ulcers, linear erythematous erosions in gastric mucosa. CMV PCR negative.   - the urine tox is positive for cannabinoid so cannabis hyperemesis syndrome is a possibility.  - agree w/ PPI, carafate, IV phenergan  - further work up and management per primary    HTN:   - amlodipine 10 mg daily  - carvedilol 37.5 mg PO BID  - clonidine 0.2 mg PO Q8h    Thank you for this consult.  Nephrology will continue to follow along with the primary team.  Please page the renal fellow on call with questions/concerns.    Patient was seen and evaluated by Dr. Cherly Hensen who agrees with this assessment and plan.     Requesting Attending Physician :  Erlinda Hong,*  Service Requesting Consult : [facility] ([facility])    Reason for Consult: renal transplant status, immunosuppression    Transplant history:  Date of transplant: 03/30/19  Transplant Nephrologist/Coordinator: Philis Nettle  Current immunosuppressants: tacrolimus and mycophenolate Interval History: No further vomiting. Pt reports nausea improved w/ switch to IV phenergan. Had small bowel movement this AM, did not note change in nausea afterwards. Several complaints about appeal of food. Had some increased burning w/ fruit this AM, sucralfate provided relief. Does not recall tacrolimus being given in a sublingual manner this morning.     Medications:     Current Facility-Administered Medications:   ???  acetaminophen (TYLENOL) tablet 500 mg, 500 mg, Oral, Q6H PRN, Jen Mow, MD, 500 mg at 05/03/19 2225  ???  sucralfate (CARAFATE) oral suspension, 1 g, Oral, BID PRN, 1 g at 05/06/19 1045 **AND** lidocaine (XYLOCAINE) 2% viscous mucosal solution, 10 mL, Oral, BID PRN, 10 mL at 05/05/19 0255 **AND** aluminum-magnesium hydroxide-simethicone (MAALOX MAX) 80-80-8 mg/mL oral suspension, 30 mL, Oral, BID PRN, Tiffany D Long, MD, 30 mL at 05/06/19 1045  ???  amLODIPine (NORVASC) tablet 10 mg, 10 mg, Oral, Daily, Jen Mow, MD, 10 mg at 05/06/19 0801  ???  aspirin chewable tablet 81 mg, 81 mg, Oral, Daily, Jen Mow, MD, 81 mg at 05/06/19 0802  ???  atorvastatin (LIPITOR) tablet 40 mg, 40 mg, Oral, Daily, Jen Mow, MD, 40 mg at 05/06/19 0802  ???  bisacodyL (DULCOLAX) suppository 10 mg, 10 mg, Rectal, Once PRN, Lelan Pons, MD  ???  carvediloL (COREG) tablet 37.5 mg, 37.5 mg, Oral, BID, Jen Mow, MD, 37.5 mg at 05/06/19 0801  ???  cloNIDine HCL (CATAPRES) tablet 0.2 mg, 0.2 mg, Oral, Q8H, Jen Mow, MD, 0.2 mg at 05/06/19 0801  ???  dextrose 50 %  in water (D50W) 50 % solution 12.5 g, 12.5 g, Intravenous, Q10 Min PRN, Bennye Alm, MD  ???  donepeziL (ARICEPT) tablet 5 mg, 5 mg, Oral, Nightly, Jen Mow, MD, 5 mg at 05/05/19 2145  ???  heparin (porcine) injection 5,000 Units, 5,000 Units, Subcutaneous, Q8H SCH, Jen Mow, MD, 5,000 Units at 05/06/19 1417 ???  insulin lispro (HumaLOG) injection 0-12 Units, 0-12 Units, Subcutaneous, ACHS, Tiffany D Long, MD, 2 Units at 05/05/19 1642  ???  ipratropium-albuteroL (DUO-NEB) 0.5-2.5 mg/3 mL nebulizer solution 3 mL, 3 mL, Nebulization, Q6H PRN, Jen Mow, MD  ???  isosorbide mononitrate (IMDUR) 24 hr tablet 60 mg, 60 mg, Oral, Daily, Jen Mow, MD, 60 mg at 05/06/19 0759  ???  lactated Ringers infusion, 10 mL/hr, Intravenous, Continuous, Beverly Milch, MD, Last Rate: 10 mL/hr at 05/04/19 1258, 10 mL/hr at 05/04/19 1258  ???  mycophenolate (MYFORTIC) EC tablet 540 mg, 540 mg, Oral, BID, Jen Mow, MD, 540 mg at 05/06/19 0802  ???  oxyCODONE (ROXICODONE) immediate release tablet 5 mg, 5 mg, Oral, Q6H PRN, 5 mg at 05/03/19 2226 **OR** [DISCONTINUED] oxyCODONE (ROXICODONE) immediate release tablet 10 mg, 10 mg, Oral, Q6H PRN, Angelique Holm Long, MD, 10 mg at 05/06/19 0801  ???  pantoprazole (PROTONIX) injection 40 mg, 40 mg, Intravenous, BID, Jen Mow, MD, 40 mg at 05/06/19 1610  ???  pregabalin (LYRICA) capsule 75 mg, 75 mg, Oral, BID, Jen Mow, MD, 75 mg at 05/06/19 0759  ???  promethazine (PHENERGAN) 12.5 mg in sodium chloride (NS) 0.9 % 25 mL infusion, 12.5 mg, Intravenous, Q6H PRN, Jen Mow, MD  ???  promethazine (PHENERGAN) tablet 25 mg, 25 mg, Oral, Q6H PRN, Jen Mow, MD  ???  senna (SENOKOT) tablet 1 tablet, 1 tablet, Oral, BID, Jen Mow, MD, 1 tablet at 05/06/19 0820  ???  sucralfate (CARAFATE) oral suspension, 1 g, Oral, Q6H SCH, Tiffany D Long, MD, 1 g at 05/06/19 0557  ???  sulfamethoxazole-trimethoprim (BACTRIM) 400-80 mg tablet 80 mg of trimethoprim, 1 tablet, Oral, Q MWF, Jen Mow, MD, 80 mg of trimethoprim at 05/06/19 0802  ???  [START ON 05/07/2019] tacrolimus (ENVARSUS XR) extended release tablet 11 mg, 11 mg, Oral, Daily, Latrelle Dodrill, MD  ???  tacrolimus (PROGRAF) capsule 2 mg, 2 mg, Sublingual, BID, Latrelle Dodrill, MD, 2 mg at 05/06/19 0759 ???  valGANciclovir (VALCYTE) tablet 450 mg, 450 mg, Oral, Daily, Jen Mow, MD, 450 mg at 05/06/19 0800     ALLERGIES  Hydrocodone and Lisinopril    MEDICAL HISTORY  Past Medical History:   Diagnosis Date   ??? End stage renal disease (CMS-HCC) 11/08/2012   ??? ESRD on hemodialysis (CMS-HCC) 08/21/2011    Pt gets dialysis with [facility] in [address], [place], on a TTS schedule.  She started dialysis in Feb 2014.  Etiology of renal failure not known, likely diabetes.  Has a left upper arm AV graft as of Dec 2014.   On transplant list since 2015.  Dr. Cherylann Ratel RN Annabelle Harman [phone]  Last Assessment & Plan:  Stable. continues to follow with lateef. On kidney transplant list.   ??? Other and unspecified hyperlipidemia 11/08/2012   ??? Type II or unspecified type diabetes mellitus 11/08/2012   ??? Unspecified essential hypertension 11/08/2012        SOCIAL HISTORY  Social History     Socioeconomic History   ??? Marital status: Widowed  Spouse name: None   ??? Number of children: None   ??? Years of education: None   ??? Highest education level: None   Occupational History   ??? None   Social Needs   ??? Financial resource strain: Not hard at all   ??? Food insecurity     Worry: Never true     Inability: Never true   ??? Transportation needs     Medical: No     Non-medical: No   Tobacco Use   ??? Smoking status: Former Smoker     Packs/day: 0.25     Years: 30.00     Pack years: 7.50     Types: Cigarettes     Quit date: 05/09/2013     Years since quitting: 5.9   ??? Smokeless tobacco: Never Used   ??? Tobacco comment: 1 pack every week; she reports historical periods of abstinence   Substance and Sexual Activity   ??? Alcohol use: Not Currently     Comment: very rare use (reports once over last year)   ??? Drug use: No   ??? Sexual activity: None   Lifestyle   ??? Physical activity     Days per week: 0 days     Minutes per session: 0 min   ??? Stress: Not at all   Relationships   ??? Social connections     Talks on phone: More than three times a week Gets together: More than three times a week     Attends religious service: Never     Active member of club or organization: No     Attends meetings of clubs or organizations: Never     Relationship status: Widowed   Other Topics Concern   ??? None   Social History Narrative   ??? None        FAMILY HISTORY  Family History   Problem Relation Age of Onset   ??? Heart disease Mother    ??? Diabetes Mother    ??? Cancer Father    ??? Colon cancer Father 77   ??? Colon cancer Sister 46   ??? Breast cancer Sister         Code Status:  Code with Limitations    Review of Systems:  A 12 system review of systems was negative except as noted in HPI.      Physical Exam:  Vitals:    05/06/19 1135   BP: 138/65   Pulse: 66   Resp: 16   Temp: 36.6 ??C   SpO2: 95%     I/O this shift:  In: -   Out: 1200 [Urine:1200]    Intake/Output Summary (Last 24 hours) at 05/06/2019 1521  Last data filed at 05/06/2019 1500  Gross per 24 hour   Intake 2237 ml   Output 1450 ml   Net 787 ml       General: In NAD  HEENT: anicteric sclera, MMM  CV: RRR, 2/6 systolic murmur, no edema  Pulm: CTAB, normal wob  GI: soft, non-tender including over graft site, non-distended  Psych: alert, engaged, appropriate mood and affect    LABS    Creatinine Trend  Lab Results   Component Value Date    Creatinine 1.13 (H) 05/06/2019    Creatinine 0.73 05/06/2019    Creatinine 1.22 (H) 05/05/2019    Creatinine 1.12 (H) 05/04/2019    Creatinine 1.06 (H) 05/03/2019    Creatinine 1.02 (H) 05/01/2019    Creatinine 1.31 (H) 04/29/2019  Creatinine 1.19 (H) 04/27/2019    Creatinine 1.15 (H) 04/25/2019    Creatinine 1.28 (H) 04/22/2019    Creatinine 1.21 (H) 04/20/2019    Creatinine 1.35 (H) 04/18/2019    Creatinine 1.46 (H) 04/11/2019    Creatinine 2.25 (H) 04/08/2019    Creatinine 3.51 (H) 04/06/2019    Creatinine 5.10 (H) 04/05/2019    Creatinine 6.33 (H) 04/04/2019    Creatinine 6.76 (H) 04/03/2019    Creatinine 7.50 (H) 04/02/2019    Creatinine 7.82 (H) 04/01/2019 Creatinine 7.68 (H) 03/31/2019    Creatinine 7.57 (H) 03/30/2019    Creatinine 7.54 (H) 03/30/2019    Creatinine 11.70 (H) 03/28/2019    Creatinine 8.14 (H) 07/07/2017    Creatinine 7.79 (H) 04/24/2016    Creatinine 8.57 (H) 04/20/2014    Creatinine 3.79 (H) 09/15/2012        Lab Results   Component Value Date    Color, UA Light Yellow 05/02/2019    Color, UA Yellow 04/20/2019    Specific Gravity, UA 1.013 05/02/2019    Specific Gravity, UA 1.009 04/20/2019    pH, UA 5.0 05/02/2019    pH, UA 6.0 04/20/2019    Protein, UA Negative 05/02/2019    Protein, UA Negative 04/20/2019    Glucose, UA 150 mg/dL (A) 16/03/9603    Glucose, UA Negative 04/20/2019    Ketones, UA 80 mg/dL (A) 54/02/8118    Ketones, UA Negative 04/20/2019    Blood, UA Small (A) 05/02/2019    Blood, UA 1+ (A) 04/20/2019    Nitrite, UA Negative 05/02/2019    Nitrite, UA Negative 04/20/2019    Leukocyte Esterase, UA Trace (A) 05/02/2019    Leukocyte Esterase, UA Trace (A) 04/20/2019    Urobilinogen, UA 0.2 mg/dL 14/78/2956    Urobilinogen, UA 0.2 04/20/2019    Bilirubin, UA Negative 05/02/2019    Bilirubin, UA Negative 04/20/2019    WBC, UA 0-5 04/20/2019    RBC, UA 3-10 (A) 04/20/2019       Lab Results   Component Value Date    Creatinine, Ur 112.6 04/08/2019    Creat U 106.0 04/25/2019    Protein/Creatinine Ratio, Urine 0.165 04/25/2019    Protein/Creat Ratio 472 (H) 04/08/2019        No results found for: NAPA, NAUR     Lab Results   Component Value Date    NA 137 05/06/2019    K 4.2 05/06/2019    CL 106 05/06/2019    CO2 24.0 05/06/2019    BUN 9 05/06/2019    CREATININE 1.13 (H) 05/06/2019      Lab Results   Component Value Date    CALCIUM 9.8 05/06/2019    MG 1.5 (L) 05/06/2019    PHOS 2.3 (L) 05/06/2019    ALBUMIN 2.0 (L) 05/06/2019      Lab Results   Component Value Date    WBC 4.8 05/05/2019    HGB 8.2 (L) 05/05/2019    HCT 25.0 (L) 05/05/2019    PLT 160 05/05/2019        IMAGING STUDIES: Reviewed

## 2019-05-06 NOTE — Unmapped (Signed)
Problem: Adult Inpatient Plan of Care  Goal: Absence of Hospital-Acquired Illness or Injury  Outcome: Progressing  Goal: Readiness for Transition of Care  Outcome: Progressing

## 2019-05-07 LAB — COMPREHENSIVE METABOLIC PANEL
ALBUMIN: 2.9 g/dL — ABNORMAL LOW (ref 3.5–5.0)
ALKALINE PHOSPHATASE: 95 U/L (ref 38–126)
ALT (SGPT): 15 U/L (ref <35)
ANION GAP: 4 mmol/L — ABNORMAL LOW (ref 7–15)
AST (SGOT): 19 U/L (ref 14–38)
BILIRUBIN TOTAL: 0.4 mg/dL (ref 0.0–1.2)
BLOOD UREA NITROGEN: 7 mg/dL (ref 7–21)
BUN / CREAT RATIO: 5
CALCIUM: 9.8 mg/dL (ref 8.5–10.2)
CHLORIDE: 105 mmol/L (ref 98–107)
CREATININE: 1.35 mg/dL — ABNORMAL HIGH (ref 0.60–1.00)
EGFR CKD-EPI AA FEMALE: 49 mL/min/{1.73_m2} — ABNORMAL LOW (ref >=60)
EGFR CKD-EPI NON-AA FEMALE: 42 mL/min/{1.73_m2} — ABNORMAL LOW (ref >=60)
GLUCOSE RANDOM: 139 mg/dL (ref 70–179)
POTASSIUM: 4.5 mmol/L (ref 3.5–5.0)
SODIUM: 136 mmol/L (ref 135–145)

## 2019-05-07 LAB — CMV DNA, QUANTITATIVE, PCR

## 2019-05-07 LAB — MAGNESIUM: Magnesium:MCnc:Pt:Ser/Plas:Qn:: 2.4 — ABNORMAL HIGH

## 2019-05-07 LAB — CMV QUANT LOG10: Lab: 0

## 2019-05-07 LAB — TACROLIMUS, TROUGH: Lab: 5.8

## 2019-05-07 LAB — PHOSPHORUS: Phosphate:MCnc:Pt:Ser/Plas:Qn:: 3.5

## 2019-05-07 LAB — EGFR CKD-EPI NON-AA FEMALE: Lab: 42 — ABNORMAL LOW

## 2019-05-07 NOTE — Unmapped (Signed)
Pt is A&O x 4. Pt is afebrile w/ stable VS. Pt has active bowels and good urine output. Last BM was 05/06/2019. No s/s of infections. BS was within range during shift. Pt had some nausea, but no vomiting overnight. Pt had some sternal/abdominal pain, with PRN oxy given 1x o/n. Plan for Pt to dc AM 11/28.  No falls during shift. WCTM.        Problem: Adult Inpatient Plan of Care  Goal: Plan of Care Review  Outcome: Progressing  Goal: Patient-Specific Goal (Individualization)  Outcome: Progressing  Goal: Absence of Hospital-Acquired Illness or Injury  Outcome: Progressing  Goal: Optimal Comfort and Wellbeing  Outcome: Progressing  Goal: Readiness for Transition of Care  Outcome: Progressing  Goal: Rounds/Family Conference  Outcome: Progressing     Problem: Diabetes Comorbidity  Goal: Blood Glucose Level Within Desired Range  Outcome: Progressing     Problem: Hypertension Comorbidity  Goal: Blood Pressure in Desired Range  Outcome: Progressing

## 2019-05-07 NOTE — Unmapped (Signed)
Internal Medicine ([facility]) Progress Note    Assessment & Plan:   Michele Cisneros is a 63 y.o. female with a PMHx of CAD, HTN, COPD, and ESRD secondary to T2DM s/p DDKT on 03/30/19 who presented to [facility] with nausea, vomiting, and substernal chest pain. .    Principal Problem:    GERD (gastroesophageal reflux disease)  Active Problems:    Status post deceased-donor kidney transplantation    Essential hypertension    COPD (chronic obstructive pulmonary disease) (CMS-HCC)    Coronary artery disease of native artery of native heart with stable angina pectoris (CMS-HCC)    Esophageal ulceration  Resolved Problems:    * No resolved hospital problems. *    Nausea - Vomiting: Onset after patient began experiencing severe burning mid-epigastric/substernal pain. Negative ACS. Workup negative for acute intra-abdominal process. Pain seems most consistent with GERD which has been worsening over past few weeks. Further history reveals trouble swallowing food and pills but not liquids. GI consulted. EGD showed ulcers, biopsies taken. CMV PCR on biopsy negative. Will await path results to help guide treatment. Possible that constipation could have been contributing to nausea.   - Follow-up pathology  - Pantoprazole 40mg  IV BID  - Schedule sucralfate, lidocaine solution, phenergan  - Schedule miralax and senna  - Diet as tolerated  - PRN meds: GI cocktail, famotidine  ??  ESRD s/p renal transplant: ESRD secondary to DM, s/p kidney transplant on 10/21. Followed by [facility]. No issues with transplanted kidney. Transplant meds include tacrolimus and Myfortic. Tac level on admission 8.3. Nephrology consulted on admission. Creatinine slightly elevated this morning.  - Gentle fluid bolus  - Nephrology following for tacrolimus  - Continue Myfortic  - Valtrex and Bactrim prophylaxis  - Avoid nephrotoxins and renally dose medications  ??  Chronic medical problems: Hypertension: Noted to be hypertensive on last clinic note. Continue home meds  COPD: Duonebs prn  CAD: Continue carvedilol  DM: SSI    Daily Checklist:  Diet: Regular Diet  DVT PPx: Heparin 5000units q8h  Electrolytes: Replete Potassium to >/= 3.6 and Magnesium to >/= 1.8  Code Status: Code with Limitations  Dispo: Continue floor care    Subjective:   No acute events overnight. Vomiting improved. Still complaining of heartburn and nausea    Objective:   Temp:  [36.1 ??C-36.8 ??C] 36.8 ??C  Heart Rate:  [64-73] 71  Resp:  [16-20] 20  BP: (137-166)/(65-74) 149/65  SpO2:  [93 %-100 %] 100 %    Gen: NAD, alert, oriented, answers questions appropriately  HEENT: atraumatic, sclera anicteric, MMM. OP w/o erythema or exudate   Heart: RRR  Lungs: CTAB, no use of accessory muscles  Abdomen: Normoactive bowel sounds, abdomen tender to palpation  Extremities: no clubbing, cyanosis, or edema  Psych: Appropriate mood and affect    Labs/Studies: Labs and Studies from the last 24hrs per EMR and Reviewed

## 2019-05-08 LAB — COMPREHENSIVE METABOLIC PANEL
ALBUMIN: 2.7 g/dL — ABNORMAL LOW (ref 3.5–5.0)
ALKALINE PHOSPHATASE: 97 U/L (ref 38–126)
ALT (SGPT): 13 U/L (ref <35)
AST (SGOT): 19 U/L (ref 14–38)
BILIRUBIN TOTAL: 0.4 mg/dL (ref 0.0–1.2)
BLOOD UREA NITROGEN: 7 mg/dL (ref 7–21)
BUN / CREAT RATIO: 5
CALCIUM: 9.6 mg/dL (ref 8.5–10.2)
CHLORIDE: 109 mmol/L — ABNORMAL HIGH (ref 98–107)
CO2: 27 mmol/L (ref 22.0–30.0)
CREATININE: 1.5 mg/dL — ABNORMAL HIGH (ref 0.60–1.00)
EGFR CKD-EPI AA FEMALE: 43 mL/min/{1.73_m2} — ABNORMAL LOW (ref >=60)
EGFR CKD-EPI NON-AA FEMALE: 37 mL/min/{1.73_m2} — ABNORMAL LOW (ref >=60)
GLUCOSE RANDOM: 140 mg/dL (ref 70–179)
POTASSIUM: 4.6 mmol/L (ref 3.5–5.0)
PROTEIN TOTAL: 5 g/dL — ABNORMAL LOW (ref 6.5–8.3)

## 2019-05-08 LAB — MAGNESIUM: Magnesium:MCnc:Pt:Ser/Plas:Qn:: 2.2

## 2019-05-08 LAB — TACROLIMUS, TROUGH: Lab: 4.9 — ABNORMAL LOW

## 2019-05-08 LAB — PHOSPHORUS: Phosphate:MCnc:Pt:Ser/Plas:Qn:: 3.1

## 2019-05-08 LAB — GLUCOSE RANDOM: Glucose:MCnc:Pt:Ser/Plas:Qn:: 140

## 2019-05-08 NOTE — Unmapped (Signed)
Internal Medicine ([facility]) Progress Note    Assessment & Plan:   Michele Cisneros is a 63 y.o. female with a PMHx of CAD, HTN, COPD, and ESRD secondary to T2DM s/p DDKT on 03/30/19 who presented to [facility] with nausea, vomiting, and substernal chest pain.     Principal Problem:    GERD (gastroesophageal reflux disease)  Active Problems:    Status post deceased-donor kidney transplantation    Essential hypertension    COPD (chronic obstructive pulmonary disease) (CMS-HCC)    Coronary artery disease of native artery of native heart with stable angina pectoris (CMS-HCC)    Esophageal ulceration  Resolved Problems:    * No resolved hospital problems. *    Nausea - Vomiting - Esophageal ulcerations: Onset after patient began experiencing severe burning mid-epigastric/substernal pain. Negative ACS. Workup negative for acute intra-abdominal process. Pain seems most consistent with GERD which has been worsening over past few weeks. Further history reveals trouble swallowing food and pills but not liquids. GI consulted. EGD showed ulcers, biopsies taken. CMV PCR on biopsy negative. Will await path results to help guide treatment. Possible that constipation or gastroparesis could have been contributing to nausea.   - Follow-up pathology  - Pantoprazole 40mg  IV BID- will consider discontinuing if Cr continues to rise  - Schedule sucralfate, lidocaine solution, phenergan  - Schedule miralax and senna  - Diet as tolerated  - PRN meds: GI cocktail, famotidine  ??  ESRD s/p renal transplant, now with AKI: ESRD secondary to DM, s/p kidney transplant on 10/21. Followed by [facility]. No issues with transplanted kidney. Transplant meds include tacrolimus and Myfortic. Tac level on admission 8.3. Nephrology consulted on admission. Creatinine elevated over the last two days with rise to 1.5 this am.  - Maintenance fluids  - Renal transplant [place]  - Urinalysis, urine Cr, urine Na  - Nephrology following for tacrolimus  - Continue Myfortic - Valtrex and Bactrim prophylaxis  - Avoid nephrotoxins and renally dose medications  ??  Chronic medical problems:  Hypertension: Noted to be hypertensive on last clinic note. Continue home meds  COPD: Duonebs prn  CAD: Continue carvedilol  DM: SSI    Daily Checklist:  Diet: Regular Diet  DVT PPx: Heparin 5000units q8h  Electrolytes: Replete Potassium to >/= 3.6 and Magnesium to >/= 1.8  Code Status: Code with Limitations  Dispo: Continue floor care    Subjective:   No acute events overnight. Vomiting improved.    Objective:   Temp:  [36.1 ??C-36.9 ??C] 36.9 ??C  Heart Rate:  [63-75] 72  Resp:  [17-19] 18  BP: (145-185)/(67-77) 145/67  SpO2:  [91 %-99 %] 91 %    Gen: NAD, alert, oriented, answers questions appropriately  HEENT: atraumatic, sclera anicteric, MMM. OP w/o erythema or exudate   Heart: RRR  Lungs: CTAB, no use of accessory muscles  Abdomen: Normoactive bowel sounds, abdomen tender to palpation  Extremities: no clubbing, cyanosis, or edema  Psych: Appropriate mood and affect    Labs/Studies: Labs and Studies from the last 24hrs per EMR and Reviewed

## 2019-05-08 NOTE — Unmapped (Signed)
4812 HD 8 N/V w/substernal chest pain (hx CAD, HTN, COPD, ESRD r/t DM2 s/p Kidney transplant) found to have esophageal ulcerations r/t GERD.  A&Ox4, VSS, pain well managed on current regimen. States still having nausea (relieved with phenergan), and heartburn (relived with multiple PRNs). LLLQ with staples from recent kidney transplant, due to be removed Monday or Tuesday per pt. BM this shift, voiding in [facility].   Pt seen and assessed. Medications administered. Education and POC followed and updated as appropriate. Bed locked in lowest position with call bell in reach and bed alarm on.     Problem: Adult Inpatient Plan of Care  Goal: Plan of Care Review  Outcome: Ongoing - Unchanged  Goal: Patient-Specific Goal (Individualization)  Outcome: Ongoing - Unchanged  Goal: Absence of Hospital-Acquired Illness or Injury  Outcome: Ongoing - Unchanged  Goal: Optimal Comfort and Wellbeing  Outcome: Ongoing - Unchanged  Goal: Readiness for Transition of Care  Outcome: Ongoing - Unchanged  Goal: Rounds/Family Conference  Outcome: Ongoing - Unchanged     Problem: COPD Comorbidity  Goal: Maintenance of COPD Symptom Control  Outcome: Ongoing - Unchanged     Problem: Diabetes Comorbidity  Goal: Blood Glucose Level Within Desired Range  Outcome: Ongoing - Unchanged     Problem: Hypertension Comorbidity  Goal: Blood Pressure in Desired Range  Outcome: Ongoing - Unchanged     Problem: Wound  Goal: Optimal Wound Healing  Outcome: Ongoing - Unchanged     Problem: Self-Care Deficit  Goal: Improved Ability to Complete Activities of Daily Living  Outcome: Ongoing - Unchanged     Problem: Fall Injury Risk  Goal: Absence of Fall and Fall-Related Injury  Outcome: Ongoing - Unchanged

## 2019-05-08 NOTE — Unmapped (Signed)
Pt. experienced no injury through out the day. Falls prevention active. Bed in lowest position and locked, 2 side rails up and call bell in reach. Skin is warm, dry and intact with staples at LLQ 2/2 recent kidney transplant. HTN and blood glucose is under control with meds as ordered (please see the MARs). Pt.is at RA w/o any resp. distress or sob.

## 2019-05-08 NOTE — Unmapped (Signed)
Transplant Nephrology Consult Follow-up Note    Assessment/Recommendations: Michele Cisneros is a 63 y.o. female w/ PMH of ESRD s/pDBD kidney transplant on 03/30/19, HTN, DMT2, hx of MI in 2014 who presented to [facility] with significant abdominal pain, nausea, and significant vomiting. We have been consulted for renal transplantation status, immunosuppression.    Renal transplant, immunosuppression DBD kidney transplant on 03/30/19. Cr up slightly to 1.35 today, will continue to monitor but suspect 2/2 to mild hypovolemia.   - continue myfortic 540 mg BID  - continue envarsus 11 mg tomorrow given improvement in nausea  - please check daily tacrolimus troughs just prior to morning dose; we will monitor levels and suggest dose adjustments if needed.    - avoid nephrotoxins including NSAIDs and contrast agents  -Prophylaxis   - bactrim MWF   - Valcyte 450 daily    Abdominal pain/vomiting/nausea: EGD 11/25 w/ several esophageal ulcers, linear erythematous erosions in gastric mucosa. CMV PCR negative.   - Would give enema given lack of significant BM during admission.  - Would trial reglan given possible gastroparesis.  - agree w/ PPI, carafate  - further work up and management per primary    HTN:   - amlodipine 10 mg daily  - carvedilol 37.5 mg PO BID  - clonidine 0.2 mg PO Q8h    Thank you for this consult.  Nephrology will continue to follow along with the primary team.  Please page the renal fellow on call with questions/concerns.    Patient was seen and evaluated by Dr. Cherly Hensen who agrees with this assessment and plan.     Requesting Attending Physician :  Ladell Pier Rearick,*  Service Requesting Consult : [facility])    Reason for Consult: renal transplant status, immunosuppression    Transplant history:  Date of transplant: 03/30/19  Transplant Nephrologist/Coordinator: Philis Nettle  Current immunosuppressants: tacrolimus and mycophenolate Interval History: Continue to have limited intake of fluids 2/2 burning sensation when swallowing. Nausea seems to be worse after eating. Patient endorses feeling like food sits heavy in her belly.     Medications:     Current Facility-Administered Medications:   ???  acetaminophen (TYLENOL) tablet 500 mg, 500 mg, Oral, Q6H PRN, Jen Mow, MD, 500 mg at 05/03/19 2225  ???  sucralfate (CARAFATE) oral suspension, 1 g, Oral, BID PRN, 1 g at 05/06/19 1045 **AND** lidocaine (XYLOCAINE) 2% viscous mucosal solution, 10 mL, Oral, BID PRN, 10 mL at 05/05/19 0255 **AND** aluminum-magnesium hydroxide-simethicone (MAALOX MAX) 80-80-8 mg/mL oral suspension, 30 mL, Oral, BID PRN, Tiffany D Long, MD, 30 mL at 05/06/19 1045  ???  amLODIPine (NORVASC) tablet 10 mg, 10 mg, Oral, Daily, Jen Mow, MD, 10 mg at 05/07/19 1610  ???  aspirin chewable tablet 81 mg, 81 mg, Oral, Daily, Jen Mow, MD, 81 mg at 05/07/19 0821  ???  atorvastatin (LIPITOR) tablet 40 mg, 40 mg, Oral, Daily, Jen Mow, MD, 40 mg at 05/07/19 9604  ???  bisacodyL (DULCOLAX) suppository 10 mg, 10 mg, Rectal, Once PRN, Lelan Pons, MD  ???  carvediloL (COREG) tablet 37.5 mg, 37.5 mg, Oral, BID, Jen Mow, MD, 37.5 mg at 05/07/19 2040  ???  cloNIDine HCL (CATAPRES) tablet 0.2 mg, 0.2 mg, Oral, Q8H, Jen Mow, MD, 0.2 mg at 05/07/19 1740  ???  dextrose 50 % in water (D50W) 50 % solution 12.5 g, 12.5 g, Intravenous, Q10 Min PRN, Bennye Alm, MD  ???  donepeziL (ARICEPT) tablet 5 mg, 5 mg, Oral, Nightly, Jen Mow, MD, 5 mg at 05/07/19 2042  ???  heparin (porcine) injection 5,000 Units, 5,000 Units, Subcutaneous, Q8H SCH, Jen Mow, MD, 5,000 Units at 05/07/19 1445  ???  insulin lispro (HumaLOG) injection 0-12 Units, 0-12 Units, Subcutaneous, ACHS, Tiffany D Long, MD, 2 Units at 05/07/19 2057  ???  ipratropium-albuteroL (DUO-NEB) 0.5-2.5 mg/3 mL nebulizer solution 3 mL, 3 mL, Nebulization, Q6H PRN, Jen Mow, MD ???  isosorbide mononitrate (IMDUR) 24 hr tablet 60 mg, 60 mg, Oral, Daily, Jen Mow, MD, 60 mg at 05/07/19 1610  ???  lactated Ringers infusion, 10 mL/hr, Intravenous, Continuous, Beverly Milch, MD, Last Rate: 10 mL/hr at 05/04/19 1258, 10 mL/hr at 05/04/19 1258  ???  lidocaine (XYLOCAINE) 2% viscous mucosal solution, 15 mL, Mouth, Q4H, Jen Mow, MD, 15 mL at 05/07/19 1739  ???  metoclopramide (REGLAN) tablet 5 mg, 5 mg, Oral, TID AC, Jen Mow, MD, 5 mg at 05/07/19 1740  ???  mycophenolate (MYFORTIC) EC tablet 540 mg, 540 mg, Oral, BID, Jen Mow, MD, 540 mg at 05/07/19 2040  ???  oxyCODONE (ROXICODONE) immediate release tablet 5 mg, 5 mg, Oral, Q6H PRN, 5 mg at 05/07/19 1236 **OR** [DISCONTINUED] oxyCODONE (ROXICODONE) immediate release tablet 10 mg, 10 mg, Oral, Q6H PRN, Bennye Alm, MD, 10 mg at 05/06/19 0801  ???  pantoprazole (PROTONIX) injection 40 mg, 40 mg, Intravenous, BID, Jen Mow, MD, 40 mg at 05/07/19 2045  ???  pregabalin (LYRICA) capsule 75 mg, 75 mg, Oral, BID, Jen Mow, MD, 75 mg at 05/07/19 2040  ???  promethazine (PHENERGAN) 12.5 mg in sodium chloride (NS) 0.9 % 25 mL infusion, 12.5 mg, Intravenous, Q6H PRN, Jen Mow, MD  ???  promethazine (PHENERGAN) tablet 25 mg, 25 mg, Oral, Q6H PRN, Jen Mow, MD  ???  senna (SENOKOT) tablet 1 tablet, 1 tablet, Oral, BID, Jen Mow, MD, 1 tablet at 05/07/19 2040  ???  sucralfate (CARAFATE) oral suspension, 1 g, Oral, Q6H SCH, Tiffany D Long, MD, 1 g at 05/07/19 1739  ???  sulfamethoxazole-trimethoprim (BACTRIM) 400-80 mg tablet 80 mg of trimethoprim, 1 tablet, Oral, Q MWF, Jen Mow, MD, 80 mg of trimethoprim at 05/06/19 0802  ???  tacrolimus (ENVARSUS XR) extended release tablet 11 mg, 11 mg, Oral, Daily, Latrelle Dodrill, MD, 11 mg at 05/07/19 0831  ???  valGANciclovir (VALCYTE) tablet 450 mg, 450 mg, Oral, Daily, Jen Mow, MD, 450 mg at 05/07/19 0825     ALLERGIES  Hydrocodone and Lisinopril MEDICAL HISTORY  Past Medical History:   Diagnosis Date   ??? End stage renal disease (CMS-HCC) 11/08/2012   ??? ESRD on hemodialysis (CMS-HCC) 08/21/2011    Pt gets dialysis with [facility] in [address], [place], on a TTS schedule.  She started dialysis in Feb 2014.  Etiology of renal failure not known, likely diabetes.  Has a left upper arm AV graft as of Dec 2014.   On transplant list since 2015.  Dr. Cherylann Ratel RN Annabelle Harman [phone]  Last Assessment & Plan:  Stable. continues to follow with lateef. On kidney transplant list.   ??? Other and unspecified hyperlipidemia 11/08/2012   ??? Type II or unspecified type diabetes mellitus 11/08/2012   ??? Unspecified essential hypertension 11/08/2012        SOCIAL HISTORY  Social History     Socioeconomic History   ??? Marital status: Widowed  Spouse name: None   ??? Number of children: None   ??? Years of education: None   ??? Highest education level: None   Occupational History   ??? None   Social Needs   ??? Financial resource strain: Not hard at all   ??? Food insecurity     Worry: Never true     Inability: Never true   ??? Transportation needs     Medical: No     Non-medical: No   Tobacco Use   ??? Smoking status: Former Smoker     Packs/day: 0.25     Years: 30.00     Pack years: 7.50     Types: Cigarettes     Quit date: 05/09/2013     Years since quitting: 5.9   ??? Smokeless tobacco: Never Used   ??? Tobacco comment: 1 pack every week; she reports historical periods of abstinence   Substance and Sexual Activity   ??? Alcohol use: Not Currently     Comment: very rare use (reports once over last year)   ??? Drug use: No   ??? Sexual activity: None   Lifestyle   ??? Physical activity     Days per week: 0 days     Minutes per session: 0 min   ??? Stress: Not at all   Relationships   ??? Social connections     Talks on phone: More than three times a week     Gets together: More than three times a week     Attends religious service: Never     Active member of club or organization: No Attends meetings of clubs or organizations: Never     Relationship status: Widowed   Other Topics Concern   ??? None   Social History Narrative   ??? None        FAMILY HISTORY  Family History   Problem Relation Age of Onset   ??? Heart disease Mother    ??? Diabetes Mother    ??? Cancer Father    ??? Colon cancer Father 21   ??? Colon cancer Sister 25   ??? Breast cancer Sister         Code Status:  Code with Limitations    Review of Systems:  A 12 system review of systems was negative except as noted in HPI.      Physical Exam:  Vitals:    05/07/19 2040   BP: 149/67   Pulse: 63   Resp: 19   Temp: 36.1 ??C   SpO2: 92%     No intake/output data recorded.    Intake/Output Summary (Last 24 hours) at 05/07/2019 2137  Last data filed at 05/07/2019 1842  Gross per 24 hour   Intake 730 ml   Output 350 ml   Net 380 ml       General: In NAD  HEENT: anicteric sclera, MMM  CV: RRR, 2/6 systolic murmur, no edema  Pulm: CTAB, normal wob  GI: soft, active bowel sounds  Psych: alert, engaged, appropriate mood and affect    LABS    Creatinine Trend  Lab Results   Component Value Date    Creatinine 1.35 (H) 05/07/2019    Creatinine 1.13 (H) 05/06/2019    Creatinine 0.73 05/06/2019    Creatinine 1.22 (H) 05/05/2019    Creatinine 1.12 (H) 05/04/2019    Creatinine 1.06 (H) 05/03/2019    Creatinine 1.02 (H) 05/01/2019    Creatinine 1.31 (H) 04/29/2019    Creatinine  1.19 (H) 04/27/2019    Creatinine 1.15 (H) 04/25/2019    Creatinine 1.28 (H) 04/22/2019    Creatinine 1.21 (H) 04/20/2019    Creatinine 1.35 (H) 04/18/2019    Creatinine 1.46 (H) 04/11/2019    Creatinine 2.25 (H) 04/08/2019    Creatinine 3.51 (H) 04/06/2019    Creatinine 5.10 (H) 04/05/2019    Creatinine 6.33 (H) 04/04/2019    Creatinine 6.76 (H) 04/03/2019    Creatinine 7.50 (H) 04/02/2019    Creatinine 7.82 (H) 04/01/2019    Creatinine 7.68 (H) 03/31/2019    Creatinine 7.57 (H) 03/30/2019    Creatinine 7.54 (H) 03/30/2019    Creatinine 11.70 (H) 03/28/2019 Creatinine 8.14 (H) 07/07/2017    Creatinine 8.57 (H) 04/20/2014    Creatinine 3.79 (H) 09/15/2012        Lab Results   Component Value Date    Color, UA Light Yellow 05/02/2019    Color, UA Yellow 04/20/2019    Specific Gravity, UA 1.013 05/02/2019    Specific Gravity, UA 1.009 04/20/2019    pH, UA 5.0 05/02/2019    pH, UA 6.0 04/20/2019    Protein, UA Negative 05/02/2019    Protein, UA Negative 04/20/2019    Glucose, UA 150 mg/dL (A) 09/81/1914    Glucose, UA Negative 04/20/2019    Ketones, UA 80 mg/dL (A) 78/29/5621    Ketones, UA Negative 04/20/2019    Blood, UA Small (A) 05/02/2019    Blood, UA 1+ (A) 04/20/2019    Nitrite, UA Negative 05/02/2019    Nitrite, UA Negative 04/20/2019    Leukocyte Esterase, UA Trace (A) 05/02/2019    Leukocyte Esterase, UA Trace (A) 04/20/2019    Urobilinogen, UA 0.2 mg/dL 30/86/5784    Urobilinogen, UA 0.2 04/20/2019    Bilirubin, UA Negative 05/02/2019    Bilirubin, UA Negative 04/20/2019    WBC, UA 0-5 04/20/2019    RBC, UA 3-10 (A) 04/20/2019       Lab Results   Component Value Date    Creatinine, Ur 112.6 04/08/2019    Creat U 106.0 04/25/2019    Protein/Creatinine Ratio, Urine 0.165 04/25/2019    Protein/Creat Ratio 472 (H) 04/08/2019        No results found for: NAPA, NAUR     Lab Results   Component Value Date    NA 136 05/07/2019    K 4.5 05/07/2019    CL 105 05/07/2019    CO2 27.0 05/07/2019    BUN 7 05/07/2019    CREATININE 1.35 (H) 05/07/2019      Lab Results   Component Value Date    CALCIUM 9.8 05/07/2019    MG 2.4 (H) 05/07/2019    PHOS 3.5 05/07/2019    ALBUMIN 2.9 (L) 05/07/2019      Lab Results   Component Value Date    WBC 4.8 05/05/2019    HGB 8.2 (L) 05/05/2019    HCT 25.0 (L) 05/05/2019    PLT 160 05/05/2019        IMAGING STUDIES: Reviewed

## 2019-05-09 LAB — URINALYSIS
BACTERIA: NONE SEEN /HPF
BILIRUBIN UA: NEGATIVE
GLUCOSE UA: NEGATIVE
HYALINE CASTS: 1 /LPF (ref 0–1)
KETONES UA: NEGATIVE
NITRITE UA: NEGATIVE
PH UA: 6 (ref 5.0–9.0)
PROTEIN UA: NEGATIVE
RBC UA: 9 /HPF — ABNORMAL HIGH (ref <=4)
SQUAMOUS EPITHELIAL: 2 /HPF (ref 0–5)
UROBILINOGEN UA: 0.2
WBC UA: 5 /HPF (ref 0–5)

## 2019-05-09 LAB — COMPREHENSIVE METABOLIC PANEL
ALBUMIN: 2.9 g/dL — ABNORMAL LOW (ref 3.5–5.0)
ALT (SGPT): 12 U/L (ref <35)
ANION GAP: 4 mmol/L — ABNORMAL LOW (ref 7–15)
AST (SGOT): 15 U/L (ref 14–38)
BILIRUBIN TOTAL: 0.5 mg/dL (ref 0.0–1.2)
BLOOD UREA NITROGEN: 8 mg/dL (ref 7–21)
BUN / CREAT RATIO: 5
CALCIUM: 9.8 mg/dL (ref 8.5–10.2)
CHLORIDE: 108 mmol/L — ABNORMAL HIGH (ref 98–107)
CO2: 24 mmol/L (ref 22.0–30.0)
CREATININE: 1.49 mg/dL — ABNORMAL HIGH (ref 0.60–1.00)
EGFR CKD-EPI AA FEMALE: 43 mL/min/{1.73_m2} — ABNORMAL LOW (ref >=60)
EGFR CKD-EPI NON-AA FEMALE: 38 mL/min/{1.73_m2} — ABNORMAL LOW (ref >=60)
GLUCOSE RANDOM: 146 mg/dL (ref 70–179)
POTASSIUM: 4.4 mmol/L (ref 3.5–5.0)
PROTEIN TOTAL: 5.4 g/dL — ABNORMAL LOW (ref 6.5–8.3)
SODIUM: 136 mmol/L (ref 135–145)

## 2019-05-09 LAB — SODIUM URINE: Lab: 86

## 2019-05-09 LAB — TACROLIMUS, TROUGH: Lab: 6.5

## 2019-05-09 LAB — CLARITY

## 2019-05-09 LAB — MAGNESIUM: Magnesium:MCnc:Pt:Ser/Plas:Qn:: 1.9

## 2019-05-09 LAB — CREATININE, URINE: Lab: 93

## 2019-05-09 LAB — PHOSPHORUS: Phosphate:MCnc:Pt:Ser/Plas:Qn:: 3.1

## 2019-05-09 LAB — BLOOD UREA NITROGEN: Urea nitrogen:MCnc:Pt:Ser/Plas:Qn:: 8

## 2019-05-09 NOTE — Unmapped (Signed)
Returned call to sister, [place].    Expressed concern over patient plan of care. Let her know we would have to see what the MD thinks after rounds today.    She will call back later.    Denies any other needs

## 2019-05-09 NOTE — Unmapped (Signed)
Internal Medicine ([facility]) Progress Note    Assessment & Plan:   Michele Cisneros is a 63 y.o. female with a PMHx of CAD, HTN, COPD, and ESRD secondary to T2DM s/p DDKT on 03/30/19 who presented to [facility] with nausea, vomiting, and substernal chest pain.     Principal Problem:    GERD (gastroesophageal reflux disease)  Active Problems:    Status post deceased-donor kidney transplantation    Essential hypertension    COPD (chronic obstructive pulmonary disease) (CMS-HCC)    Coronary artery disease of native artery of native heart with stable angina pectoris (CMS-HCC)    Esophageal ulceration  Resolved Problems:    * No resolved hospital problems. *    ESRD s/p renal transplant, now with AKI: ESRD secondary to DM, s/p kidney transplant on 10/21. Followed by [facility]. No prior issues with transplanted kidney. Transplant meds include tacrolimus and Myfortic. Tac level on admission 8.3. Nephrology consulted on admission. Creatinine elevated over the last two days with rise to 1.5 this am. Renal ultrasound shows elevated indices but relatively unremarkable. UA consistent with pre-renal injury.  - Maintenance fluids  - Nephrology to dose tacrolimus  - Continue Myfortic  - Valtrex and Bactrim prophylaxis  - Avoid nephrotoxins and renally dose medications    Nausea - Vomiting - Esophageal ulcerations: Onset after patient began experiencing severe burning mid-epigastric/substernal pain. Negative ACS. Workup negative for acute intra-abdominal process. Pain seems most consistent with GERD which has been worsening over past few weeks. Further history reveals trouble swallowing food and pills but not liquids. GI consulted. EGD showed ulcers, biopsies taken. CMV PCR on biopsy negative. Will await path results to help guide treatment. Possible that constipation or gastroparesis could have been contributing to nausea.   - Follow-up pathology  - Pantoprazole 40mg  IV BID  - Schedule sucralfate, lidocaine solution, reglan - Schedule miralax and senna  - Diet as tolerated  - PRN meds: GI cocktail, famotidine, phenergan  ??  Chronic medical problems:  Hypertension: Noted to be hypertensive on last clinic note. Continue home meds  COPD: Duonebs prn  CAD: Continue carvedilol  DM: SSI    Daily Checklist:  Diet: Regular Diet  DVT PPx: Heparin 5000units q8h  Electrolytes: Replete Potassium to >/= 3.6 and Magnesium to >/= 1.8  Code Status: Code with Limitations  Dispo: Continue floor care    Subjective:   No acute events overnight. Pain still present but medications do provide relief    Objective:   Temp:  [36.1 ??C-36.9 ??C] 36.4 ??C  Heart Rate:  [72-79] 72  Resp:  [16-18] 18  BP: (135-166)/(61-72) 166/72  SpO2:  [95 %-100 %] 95 %    Gen: NAD, alert, oriented, answers questions appropriately  HEENT: atraumatic, sclera anicteric, MMM. OP w/o erythema or exudate   Heart: RRR  Lungs: CTAB, no use of accessory muscles  Abdomen: Normoactive bowel sounds, abdomen tender to palpation  Extremities: no clubbing, cyanosis, or edema  Psych: Appropriate mood and affect    Labs/Studies: Labs and Studies from the last 24hrs per EMR and Reviewed

## 2019-05-09 NOTE — Unmapped (Signed)
""  Order was placed for a \""PIV by Venous Access Team (VAT)\"".  Patient was assessed at bedside for placement of a PIV. Mask and protective eyewear were donned. Access was obtained. Blood return noted.  Dressing intact and device well secured.  Flushed with normal saline.  Pt advised to inform RN of any s/s of discomfort at the PIV site.    Workup / Procedure Time:  30 minutes       Care RN was notified.       Thank you,     Gillie Manners RN Venous Access Team""

## 2019-05-09 NOTE — Unmapped (Addendum)
Pt alert and oriented, on room air, no respiratory distress. Denies pain. Afebrile, elevated BP, on scheduled Catapress.  Urine sample sent to lab.  Up to [facility] with SBA.  IVF infusing. Complains of burning throat and heart burn, prn  Maalox  and scheduled  Sulcrafate given. Melatonin given.         Problem: Adult Inpatient Plan of Care  Goal: Plan of Care Review  05/09/2019 0512 by Marta Antu, RN  Outcome: Progressing  05/09/2019 0511 by Marta Antu, RN  Outcome: Progressing  Goal: Patient-Specific Goal (Individualization)  05/09/2019 0512 by Marta Antu, RN  Outcome: Progressing  05/09/2019 0511 by Marta Antu, RN  Outcome: Ongoing - Unchanged  Goal: Absence of Hospital-Acquired Illness or Injury  05/09/2019 0512 by Marta Antu, RN  Outcome: Progressing  05/09/2019 0511 by Marta Antu, RN  Outcome: Ongoing - Unchanged  Goal: Optimal Comfort and Wellbeing  05/09/2019 0512 by Marta Antu, RN  Outcome: Progressing  05/09/2019 0511 by Marta Antu, RN  Outcome: Ongoing - Unchanged  Goal: Readiness for Transition of Care  05/09/2019 0512 by Marta Antu, RN  Outcome: Progressing  05/09/2019 0511 by Marta Antu, RN  Outcome: Ongoing - Unchanged  Goal: Rounds/Family Conference  05/09/2019 0512 by Marta Antu, RN  Outcome: Progressing  05/09/2019 0511 by Marta Antu, RN  Outcome: Ongoing - Unchanged     Problem: COPD Comorbidity  Goal: Maintenance of COPD Symptom Control  05/09/2019 0512 by Marta Antu, RN  Outcome: Progressing  05/09/2019 0511 by Marta Antu, RN  Outcome: Ongoing - Unchanged     Problem: Diabetes Comorbidity  Goal: Blood Glucose Level Within Desired Range  05/09/2019 0512 by Marta Antu, RN  Outcome: Progressing  05/09/2019 0511 by Marta Antu, RN  Outcome: Ongoing - Unchanged     Problem: Hypertension Comorbidity  Goal: Blood Pressure in Desired Range  05/09/2019 0512 by Marta Antu, RN  Outcome: Progressing  05/09/2019 0511 by Marta Antu, RN  Outcome: Ongoing - Unchanged     Problem: Wound  Goal: Optimal Wound Healing 05/09/2019 0512 by Marta Antu, RN  Outcome: Progressing  05/09/2019 0511 by Marta Antu, RN  Outcome: Ongoing - Unchanged     Problem: Self-Care Deficit  Goal: Improved Ability to Complete Activities of Daily Living  05/09/2019 0512 by Marta Antu, RN  Outcome: Progressing  05/09/2019 0511 by Marta Antu, RN  Outcome: Ongoing - Unchanged     Problem: Fall Injury Risk  Goal: Absence of Fall and Fall-Related Injury  05/09/2019 0512 by Marta Antu, RN  Outcome: Progressing  05/09/2019 0511 by Marta Antu, RN  Outcome: Ongoing - Unchanged

## 2019-05-09 NOTE — Unmapped (Addendum)
Hi Olegario Messier and Darren,    This patient is posted for transplant stent removal in locals with Dr. Jodie Echevaria today, but she is currently admitted. Estimated day of discharge is unknown but hopefully soon per primary team.    Can we please cancel her appt today and reschedule her for next available ureteral stent removal in locals with any available provider? I greatly appreciate your help!    Best,  Dahlia Client

## 2019-05-09 NOTE — Unmapped (Signed)
Pt has been free from falls. VSS. Needed 2U of insulin for coverage today. Had an ultrasound of the kidney today. Started on 150ml/hr of LR due to diminished urine production. Cr is still elevated. UA, sodium and Cr ordered but not yet obtained (passed to night nurse Dwana Curd). New IV placed today due to loss of access. WCTM    Problem: Adult Inpatient Plan of Care  Goal: Plan of Care Review  Outcome: Ongoing - Unchanged  Goal: Patient-Specific Goal (Individualization)  Outcome: Ongoing - Unchanged  Goal: Absence of Hospital-Acquired Illness or Injury  Outcome: Ongoing - Unchanged  Goal: Optimal Comfort and Wellbeing  Outcome: Ongoing - Unchanged  Goal: Readiness for Transition of Care  Outcome: Ongoing - Unchanged  Goal: Rounds/Family Conference  Outcome: Ongoing - Unchanged     Problem: COPD Comorbidity  Goal: Maintenance of COPD Symptom Control  Outcome: Ongoing - Unchanged     Problem: Diabetes Comorbidity  Goal: Blood Glucose Level Within Desired Range  Outcome: Ongoing - Unchanged     Problem: Hypertension Comorbidity  Goal: Blood Pressure in Desired Range  Outcome: Ongoing - Unchanged     Problem: Wound  Goal: Optimal Wound Healing  Outcome: Ongoing - Unchanged     Problem: Self-Care Deficit  Goal: Improved Ability to Complete Activities of Daily Living  Outcome: Ongoing - Unchanged     Problem: Fall Injury Risk  Goal: Absence of Fall and Fall-Related Injury  Outcome: Ongoing - Unchanged

## 2019-05-10 DIAGNOSIS — Z940 Kidney transplant status: Principal | ICD-10-CM

## 2019-05-10 DIAGNOSIS — Z79899 Other long term (current) drug therapy: Principal | ICD-10-CM

## 2019-05-10 LAB — COMPREHENSIVE METABOLIC PANEL
ALBUMIN: 2.6 g/dL — ABNORMAL LOW (ref 3.5–5.0)
ALKALINE PHOSPHATASE: 81 U/L (ref 38–126)
ALT (SGPT): 9 U/L (ref <35)
ANION GAP: 4 mmol/L — ABNORMAL LOW (ref 7–15)
AST (SGOT): 14 U/L (ref 14–38)
BILIRUBIN TOTAL: 0.6 mg/dL (ref 0.0–1.2)
BLOOD UREA NITROGEN: 8 mg/dL (ref 7–21)
BUN / CREAT RATIO: 6
CALCIUM: 9.5 mg/dL (ref 8.5–10.2)
CHLORIDE: 106 mmol/L (ref 98–107)
CO2: 26 mmol/L (ref 22.0–30.0)
CREATININE: 1.34 mg/dL — ABNORMAL HIGH (ref 0.60–1.00)
EGFR CKD-EPI AA FEMALE: 49 mL/min/{1.73_m2} — ABNORMAL LOW (ref >=60)
GLUCOSE RANDOM: 147 mg/dL (ref 70–179)
POTASSIUM: 4.7 mmol/L (ref 3.5–5.0)
PROTEIN TOTAL: 4.9 g/dL — ABNORMAL LOW (ref 6.5–8.3)
SODIUM: 136 mmol/L (ref 135–145)

## 2019-05-10 LAB — PHOSPHORUS: Phosphate:MCnc:Pt:Ser/Plas:Qn:: 3.2

## 2019-05-10 LAB — MAGNESIUM: Magnesium:MCnc:Pt:Ser/Plas:Qn:: 1.7

## 2019-05-10 LAB — ALT (SGPT): Alanine aminotransferase:CCnc:Pt:Ser/Plas:Qn:: 9

## 2019-05-10 LAB — TACROLIMUS, TROUGH: Lab: 5.7

## 2019-05-10 MED ORDER — PANTOPRAZOLE 40 MG INTRAVENOUS SOLUTION
Freq: Two times a day (BID) | INTRAVENOUS | 0 refills | 30.00000 days | Status: CP
Start: 2019-05-10 — End: 2019-05-10
  Filled 2019-05-24: qty 60, 30d supply, fill #1

## 2019-05-10 MED ORDER — TACROLIMUS ER 1 MG TABLET,EXTENDED RELEASE 24 HR: 11 mg | tablet | Freq: Every day | 3 refills | 30 days | Status: AC

## 2019-05-10 MED ORDER — LIDOCAINE HCL 2 % MUCOSAL SOLUTION
OROMUCOSAL | 5 refills | 23.00000 days | Status: CP
Start: 2019-05-10 — End: ?
  Filled 2019-05-10: qty 500, 6d supply, fill #0

## 2019-05-10 MED ORDER — SUCRALFATE 100 MG/ML ORAL SUSPENSION
Freq: Four times a day (QID) | ORAL | 0 refills | 30 days | Status: CP
Start: 2019-05-10 — End: 2019-06-09
  Filled 2019-05-10: qty 1200, 30d supply, fill #0

## 2019-05-10 MED ORDER — POLYETHYLENE GLYCOL 3350 17 GRAM/DOSE ORAL POWDER
Freq: Every day | ORAL | 0 refills | 30.00000 days | PRN
Start: 2019-05-10 — End: 2019-06-09

## 2019-05-10 MED ORDER — MYCOPHENOLATE SODIUM 360 MG TABLET,DELAYED RELEASE
ORAL_TABLET | Freq: Three times a day (TID) | ORAL | 11 refills | 30 days | Status: CP
Start: 2019-05-10 — End: ?
  Filled 2019-05-10: qty 90, 30d supply, fill #0

## 2019-05-10 MED ORDER — TACROLIMUS ER 1 MG TABLET,EXTENDED RELEASE 24 HR: 11 mg | tablet | Freq: Every day | 3 refills | 30 days

## 2019-05-10 MED ORDER — METOCLOPRAMIDE 5 MG TABLET
ORAL_TABLET | Freq: Three times a day (TID) | ORAL | 0 refills | 30.00000 days | Status: CP
Start: 2019-05-10 — End: 2019-06-09
  Filled 2019-05-10: qty 90, 30d supply, fill #0

## 2019-05-10 MED ORDER — PROMETHAZINE 25 MG TABLET
Freq: Four times a day (QID) | ORAL | 3 refills | 8.00000 days | Status: CP | PRN
Start: 2019-05-10 — End: ?
  Filled 2019-05-10: qty 30, 8d supply, fill #0

## 2019-05-10 MED ORDER — MIRTAZAPINE 15 MG TABLET
ORAL_TABLET | Freq: Every evening | ORAL | 3 refills | 90 days | Status: CP
Start: 2019-05-10 — End: 2019-08-08
  Filled 2019-05-10: qty 90, 90d supply, fill #0

## 2019-05-10 MED ORDER — DERMABASE EX
25.00 | CUTANEOUS | Status: DC
Start: ? — End: 2019-05-10

## 2019-05-10 MED ORDER — ASTELIN 137 MCG/SPRAY NA SOLN
40.00 | NASAL | Status: DC
Start: 2019-05-10 — End: 2019-05-10

## 2019-05-10 MED ORDER — CIMETIDINE HCL
360.00 | Status: DC
Start: ? — End: 2019-05-10

## 2019-05-10 MED ORDER — Medication
Status: DC
Start: ? — End: 2019-05-10

## 2019-05-10 MED ORDER — Medication
1.00 | Status: DC
Start: 2019-05-10 — End: 2019-05-10

## 2019-05-10 MED ORDER — Medication
11.00 | Status: DC
Start: 2019-05-11 — End: 2019-05-10

## 2019-05-10 MED ORDER — Medication
5.00 | Status: DC
Start: ? — End: 2019-05-10

## 2019-05-10 MED ORDER — METRONID-TETRACYC-BIS SUBSAL PO MISC
5.00 | ORAL | Status: DC
Start: 2019-05-11 — End: 2019-05-10

## 2019-05-10 MED ORDER — OLANZAPINE-FLUOXETINE HCL 6-50 MG PO CAPS
3.00 | ORAL_CAPSULE | ORAL | Status: DC
Start: 2019-05-10 — End: 2019-05-10

## 2019-05-10 MED ORDER — METRISET BURETTE SET MISC
10.00 | Status: DC
Start: ? — End: 2019-05-10

## 2019-05-10 MED ORDER — DICLOXACILLIN SODIUM 62.5 MG/5ML PO SUSR
10.00 | ORAL | Status: DC
Start: ? — End: 2019-05-10

## 2019-05-10 MED ORDER — DAYHIST-1 PO
15.00 | ORAL | Status: DC
Start: 2019-05-10 — End: 2019-05-10

## 2019-05-10 MED ORDER — GRAPE FLAVOR LIQD
15.00 | Status: DC
Start: 2019-05-10 — End: 2019-05-10

## 2019-05-10 MED ORDER — Medication
12.50 | Status: DC
Start: ? — End: 2019-05-10

## 2019-05-10 MED ORDER — MAVIK 1 MG PO TABS
1.00 | ORAL_TABLET | ORAL | Status: DC
Start: 2019-05-10 — End: 2019-05-10

## 2019-05-10 MED ADMIN — pregabalin (LYRICA) capsule 75 mg: 75 mg | ORAL | @ 14:00:00 | Stop: 2019-05-10

## 2019-05-10 MED ADMIN — insulin lispro (HumaLOG) injection 0-12 Units: 0-12 [IU] | SUBCUTANEOUS | @ 18:00:00 | Stop: 2019-05-10

## 2019-05-10 MED ADMIN — cloNIDine HCL (CATAPRES) tablet 0.2 mg: .2 mg | ORAL | @ 14:00:00 | Stop: 2019-05-10

## 2019-05-10 MED ADMIN — aspirin chewable tablet 81 mg: 81 mg | ORAL | @ 14:00:00 | Stop: 2019-05-10

## 2019-05-10 MED ADMIN — oxyCODONE (ROXICODONE) immediate release tablet 5 mg: 5 mg | ORAL | @ 21:00:00 | Stop: 2019-05-10

## 2019-05-10 MED ADMIN — carvediloL (COREG) tablet 37.5 mg: 37.5 mg | ORAL | @ 14:00:00 | Stop: 2019-05-10

## 2019-05-10 MED ADMIN — lactated Ringers infusion: 100 mL/h | INTRAVENOUS | @ 09:00:00 | Stop: 2019-05-10

## 2019-05-10 MED ADMIN — sucralfate (CARAFATE) oral suspension: 1 g | ORAL | @ 17:00:00 | Stop: 2019-05-10

## 2019-05-10 MED ADMIN — mycophenolate (MYFORTIC) EC tablet 540 mg: 540 mg | ORAL | @ 14:00:00 | Stop: 2019-05-10

## 2019-05-10 MED ADMIN — lidocaine (XYLOCAINE) 2% viscous mucosal solution: 15 mL | OROMUCOSAL | @ 14:00:00 | Stop: 2019-05-10

## 2019-05-10 MED ADMIN — senna (SENOKOT) tablet 1 tablet: 1 | ORAL | @ 14:00:00 | Stop: 2019-05-10

## 2019-05-10 MED ADMIN — tacrolimus (ENVARSUS XR) extended release tablet 11 mg: 11 mg | ORAL | @ 14:00:00 | Stop: 2019-05-10

## 2019-05-10 MED ADMIN — acetaminophen (TYLENOL) tablet 500 mg: 500 mg | ORAL | @ 12:00:00 | Stop: 2019-05-10

## 2019-05-10 MED ADMIN — pantoprazole (PROTONIX) injection 40 mg: 40 mg | INTRAVENOUS | @ 14:00:00 | Stop: 2019-05-10

## 2019-05-10 MED ADMIN — lidocaine (XYLOCAINE) 2% viscous mucosal solution: 15 mL | OROMUCOSAL | @ 19:00:00 | Stop: 2019-05-10

## 2019-05-10 MED ADMIN — heparin (porcine) injection 5,000 Units: 5000 [IU] | SUBCUTANEOUS | @ 11:00:00 | Stop: 2019-05-10

## 2019-05-10 MED ADMIN — oxyCODONE (ROXICODONE) immediate release tablet 5 mg: 5 mg | ORAL | @ 14:00:00 | Stop: 2019-05-10

## 2019-05-10 MED ADMIN — isosorbide mononitrate (IMDUR) 24 hr tablet 60 mg: 60 mg | ORAL | @ 14:00:00 | Stop: 2019-05-10

## 2019-05-10 MED ADMIN — atorvastatin (LIPITOR) tablet 40 mg: 40 mg | ORAL | @ 14:00:00 | Stop: 2019-05-10

## 2019-05-10 MED ADMIN — metoclopramide (REGLAN) tablet 5 mg: 5 mg | ORAL | @ 17:00:00 | Stop: 2019-05-10

## 2019-05-10 MED ADMIN — valGANciclovir (VALCYTE) tablet 450 mg: 450 mg | ORAL | @ 14:00:00 | Stop: 2019-05-10

## 2019-05-10 MED ADMIN — sucralfate (CARAFATE) oral suspension: 1 g | ORAL | @ 05:00:00 | Stop: 2019-05-10

## 2019-05-10 MED ADMIN — amLODIPine (NORVASC) tablet 10 mg: 10 mg | ORAL | @ 14:00:00 | Stop: 2019-05-10

## 2019-05-10 MED FILL — PROMETHAZINE 25 MG TABLET: 8 days supply | Qty: 30 | Fill #0 | Status: AC

## 2019-05-10 MED FILL — METOCLOPRAMIDE 5 MG TABLET: 30 days supply | Qty: 90 | Fill #0 | Status: AC

## 2019-05-10 MED FILL — MYCOPHENOLATE SODIUM 360 MG TABLET,DELAYED RELEASE: 30 days supply | Qty: 90 | Fill #0 | Status: AC

## 2019-05-10 MED FILL — LIDOCAINE VISCOUS 2 % MUCOSAL SOLUTION: 6 days supply | Qty: 500 | Fill #0 | Status: AC

## 2019-05-10 MED FILL — MIRTAZAPINE 15 MG TABLET: 90 days supply | Qty: 90 | Fill #0 | Status: AC

## 2019-05-10 MED FILL — SUCRALFATE 100 MG/ML ORAL SUSPENSION: 30 days supply | Qty: 1200 | Fill #0 | Status: AC

## 2019-05-10 NOTE — Telephone Encounter (Signed)
Patient is currently admitted at [facility] to discharge later today. Will follow as appropriate.

## 2019-05-10 NOTE — Unmapped (Signed)
Pt alert and oriented, on room air, no respiratory distress. Denies pain. Afebrile, vitals stable.  Voided , post void .  LR infusing at 145ml/hr. Prn Oxycodone administered for pain. Up to [facility] with SBA. Remains free from falls, call bell in reach and will continue to monitor.     Complains of headache, pain inside right neck and blurred vision. Denies nausea/vomtimg, no diaphoresis. Prn tylenol given for headache,  No relief from tylenol per pt. On call provider B. Harder paged. Will continue to monitor.

## 2019-05-10 NOTE — Unmapped (Signed)
Pathology follow-up note: Non-specific findings. Please see Dr. Blair Dolphin treatment plan note from 11/25 for additional recommendations.     Final Diagnosis   A: Stomach, biopsy  Antral-type mucosa with changes consistent with reactive gastropathy  No Helicobacter organisms identified by H&E stain  No intestinal metaplasia or dysplasia identified   ??  B: Esophagus, biopsy  Ulcerated squamous mucosa (see comment)  HSV 1/HSV 2 combination immunostain: no definitive positive staining   CMV immunostain: negative  No granulomas, intestinal metaplasia, or dysplasia identified   Potential etiologies to consider include, but are not limited to, severe reflux esophagitis, pill-induced esophagitis, infection, and Crohn disease. Correlation with the clinical and endoscopic findings is necessary to determine the most likely etiology.

## 2019-05-10 NOTE — Unmapped (Signed)
Physician Discharge Summary [facility]  4 ONC [facility]  [address]  [place] [address]  Dept: [phone]  Loc: [phone]     Identifying Information:   Michele Cisneros  05/27/1956  [id number]    Primary Care Physician: Donnald Garre, MD   Code Status: Code with Limitations     Admit Date: 05/01/2019    Discharge Date: 05/10/2019     Discharge To: Home with Home Health and/or PT/OT    Discharge Service: [facility] - GEN MED L Teaching (MDL TEAM 1)     Discharge Attending Physician: Janell Quiet Mock, MD    Discharge Diagnoses:  Principal Problem:    GERD (gastroesophageal reflux disease) POA: Yes  Active Problems:    Status post deceased-donor kidney transplantation POA: Not Applicable    Essential hypertension POA: Yes    COPD (chronic obstructive pulmonary disease) (CMS-HCC) POA: Yes    Coronary artery disease of native artery of native heart with stable angina pectoris (CMS-HCC) POA: Yes    Esophageal ulceration POA: Yes  Resolved Problems:    * No resolved hospital problems. *      Outpatient Provider Follow Up Issues:   - Patient will need a tac level checked outpatient as doses had to be adjusted frequently inpatient  - Appreciate close follow-up to ensure symptoms controlled and she is able to maintain adequate hydration given proximity to transplant.  - Follow-up to be arranged by Urology for stent removal as she missed her appointment due to being hospitalized    Hospital Course:   Michele Cisneros is a 63 y.o. female with a PMHx of CAD, HTN, COPD, and ESRD secondary to T2DM s/p DDKT on 03/30/19??who??presented to [facility]??with nausea, vomiting, and substernal chest pain. On arrival to the ED, she was hypertensive with vitals otherwise stable. She has been afebrile. Labs on admission notable for a leukocytosis to 11.1 and BNP of 2820 that has since increased to 3890. UA notable only for glucose and ketones; no evidence of infection. Because she was complaining of substernal pain and hypertensive, concern was raised for possible aortic dissection. Transplant Nephrology consulted for consideration of CT imaging with contrast, but suspicion of dissection overall low by Transplant Surgery who evaluated her, so CT non-contrast chosen. Imaging was nondiagnostic for aortic dissection, but no other acute pathology seen. CT abdomen/pelvis negative for pathology affecting transplanted kidney or biliary pathology. As there was no role for surgical intervention, decision was made to admit to medicine for supportive care and PO challenge given that patient would not be able to be seen in clinic this week should she feel worse. Her hospital course is as outlined below. Nausea - Vomiting: Onset after patient began experiencing severe burning mid-epigastric/substernal pain. ACS rule out in ED with CT non-contrast nondiagnostic of aortic dissection. Troponins negative. Workup negative for acute intra-abdominal process. Pain seems most consistent with GERD which has been worsening over past few weeks. Further history revealed trouble swallowing food and pills but not liquids. She continued to have burning pain when drinking water. GI was consulted for concern of sequela of GERD (stricture, esophagitis) or infectious etiologies given her immune suppression. EGD on 11/25 showed severe esophageal ulcerations. Biopsy was negative for HSV, CMV and was most consistent with severe esophagitis likely due to reflux. A biopsy of an ulcer in the stomach was negative for H. Pylori. For her ulcerations, she will be treated with aggressive symptom control to allow her ulcers to begin healing. During her hospitalization,  Nutrition was consulted to help her advance her diet, and by the time of discharge, she was able to tolerate solids and liquids to keep herself properly hydrated. We counseled her on return precautions, emphasizing that if she was unable to tolerate oral intake, to call her physician as hydration is important to her transplant success.    ESRD s/p renal transplant:??ESRD secondary to DM, s/p kidney transplant on 10/21. Followed by [facility]. Had not had any issues with transplanted kidney. Transplant meds include tacrolimus and Myfortic. Tac level on admission 8.3. Nephrology consulted on admission. Patient was able to stop Lasix. Continued all other home medications with Pharmacy dosing tacrolimus. Creatinine had increased during her stay, and it was thought that this was due to her inability to keep up with oral hydration. Tacrolimus dosing adjusted several times, but she will resume her 11mg  dose with follow-up one week after discharge with [name]. Touchbase with Outpatient Provider:  Warm Handoff: Completed on 05/10/19 by Jen Mow  (Intern) via Face to Face    Procedures:  EGD  No admission procedures for hospital encounter.  ______________________________________________________________________  Discharge Medications:     Your Medication List      STOP taking these medications    DOK 100 MG capsule  Generic drug: docusate sodium     furosemide 80 MG tablet  Commonly known as: LASIX     oxybutynin 5 MG tablet  Commonly known as: DITROPAN        START taking these medications    lidocaine 2% viscous 2 % Soln  Commonly known as: XYLOCAINE  Use 15 mL by Mouth as directed Every four (4) hours.     metoclopramide 5 MG tablet  Commonly known as: REGLAN  Take 1 tablet (5 mg total) by mouth Three (3) times a day before meals.     mirtazapine 15 MG tablet  Commonly known as: REMERON  Take 1 tablet (15 mg total) by mouth nightly.     promethazine 25 MG tablet  Commonly known as: PHENERGAN  Take 1 tablet (25 mg total) by mouth every six (6) hours as needed for up to 28 doses.     sucralfate 100 mg/mL suspension  Commonly known as: CARAFATE  Take 10 mL (1 g total) by mouth Every six (6) hours.        CHANGE how you take these medications    mycophenolate 360 MG Tbec  Commonly known as: MYFORTIC  Take 1 tablet (360 mg total) by mouth Three (3) times a day.  What changed:   ?? medication strength  ?? how much to take  ?? when to take this     tacrolimus 1 mg Tb24 extended release tablet  Commonly known as: ENVARSUS XR  Take 11 tablets (11 mg) by mouth daily.  Start taking on: May 11, 2019  What changed:   ?? how much to take  ?? Another medication with the same name was removed. Continue taking this medication, and follow the directions you see here.        CONTINUE taking these medications    acetaminophen 500 MG tablet  Commonly known as: TYLENOL  Take 1-2 tablets (500-1,000 mg total) by mouth every six (6) hours as needed for pain or fever (> 38C or 100.62F). albuterol 90 mcg/actuation inhaler  Commonly known as: PROVENTIL HFA;VENTOLIN HFA  Inhale 2 puffs every six (6) hours as needed for wheezing.     amLODIPine 10 MG tablet  Commonly known  as: NORVASC  Take 1 tablet (10 mg total) by mouth daily.     aspirin 81 MG tablet  Commonly known as: ECOTRIN  Take 81 mg by mouth daily.     atorvastatin 40 MG tablet  Commonly known as: LIPITOR  Take 1 tablet (40 mg total) by mouth daily.     carvediloL 12.5 MG tablet  Commonly known as: COREG  Take 3 tablets (37.5 mg total) by mouth Two (2) times a day.     cloNIDine HCL 0.1 MG tablet  Commonly known as: CATAPRES  Take 2 tablets (0.2 mg total) by mouth every eight (8) hours.     CONTOUR TEST STRIPS Strp  Generic drug: blood sugar diagnostic  by Other route two (2) times a day. TEST BLOOD SUGARS 2 TIMES DAILY     donepeziL 5 MG tablet  Commonly known as: ARICEPT  Take 5 mg by mouth nightly.     isosorbide mononitrate 60 MG 24 hr tablet  Commonly known as: IMDUR  Take 60 mg by mouth daily.     LANCETS,THIN MISC  Using at home to test blood sugars twice daily.     magnesium (amino acid chelate) 133 mg Tab  Generic drug: magnesium oxide-Mg AA chelate  Take 1 tablet by mouth Two (2) times a day. HOLD until directed to start by your coordinator.     metFORMIN 500 MG 24 hr tablet  Commonly known as: GLUCOPHAGE XR  Take 1 tablet (500 mg total) by mouth daily.     NITROSTAT 0.4 MG SL tablet  Generic drug: nitroglycerin  Place 0.4 mg under the tongue daily as needed.     ONETOUCH ULTRA SYSTEM KIT kit  Generic drug: blood-glucose meter  Please dispense for patient to use at home to check blood sugars.     oxyCODONE 5 MG immediate release tablet  Commonly known as: ROXICODONE  Take 1-2 tablets (5-10 mg total) by mouth every six (6) hours as needed for pain.     pantoprazole 40 MG tablet  Commonly known as: PROTONIX  Take 1 tablet (40 mg total) by mouth Two (2) times a day.     polyethylene glycol 17 gram/dose powder Commonly known as: GLYCOLAX  Mix 1 capful (17 g) in 4 to 8 ounces water, juice, soda, coffee, or tea and drink daily as needed.     pregabalin 75 MG capsule  Commonly known as: LYRICA  Take 1 capsule (75 mg total) by mouth Two (2) times a day.     SENNA LAX 8.6 mg tablet  Generic drug: senna  Take 1 tablet by mouth nightly as needed for constipation.     sulfamethoxazole-trimethoprim 400-80 mg per tablet  Commonly known as: BACTRIM  Take 1 tablet (80 mg of trimethoprim total) by mouth Every Monday, Wednesday, and Friday.     valGANciclovir 450 mg tablet  Commonly known as: VALCYTE  Take 1 tablet (450 mg total) by mouth daily.            Allergies:  Hydrocodone and Lisinopril  ______________________________________________________________________  Pending Test Results (if blank, then none):      Most Recent Labs:  All lab results last 24 hours -   Recent Results (from the past 24 hour(s))   POCT Glucose    Collection Time: 05/09/19  4:45 PM   Result Value Ref Range    Glucose, POC 154 70 - 179 mg/dL   POCT Glucose    Collection Time: 05/09/19  8:49  PM   Result Value Ref Range    Glucose, POC 141 70 - 179 mg/dL   Comprehensive Metabolic Panel    Collection Time: 05/10/19  7:19 AM   Result Value Ref Range    Sodium 136 135 - 145 mmol/L    Potassium 4.7 3.5 - 5.0 mmol/L    Chloride 106 98 - 107 mmol/L    Anion Gap 4 (L) 7 - 15 mmol/L    CO2 26.0 22.0 - 30.0 mmol/L    BUN 8 7 - 21 mg/dL    Creatinine 1.61 (H) 0.60 - 1.00 mg/dL    BUN/Creatinine Ratio 6     EGFR CKD-EPI Non-African American, Female 43 (L) >=60 mL/min/1.38m2    EGFR CKD-EPI African American, Female 49 (L) >=60 mL/min/1.8m2    Glucose 147 70 - 179 mg/dL    Calcium 9.5 8.5 - 09.6 mg/dL    Albumin 2.6 (L) 3.5 - 5.0 g/dL    Total Protein 4.9 (L) 6.5 - 8.3 g/dL    Total Bilirubin 0.6 0.0 - 1.2 mg/dL    AST 14 14 - 38 U/L    ALT 9 <35 U/L    Alkaline Phosphatase 81 38 - 126 U/L   Magnesium Level    Collection Time: 05/10/19  7:19 AM   Result Value Ref Range Magnesium 1.7 1.6 - 2.2 mg/dL   Phosphorus Level    Collection Time: 05/10/19  7:19 AM   Result Value Ref Range    Phosphorus 3.2 2.9 - 4.7 mg/dL   Tacrolimus Level, Trough    Collection Time: 05/10/19  7:19 AM   Result Value Ref Range    Tacrolimus, Trough 5.7 5.0 - 15.0 ng/mL   POCT Glucose    Collection Time: 05/10/19  7:23 AM   Result Value Ref Range    Glucose, POC 148 70 - 179 mg/dL   POCT Glucose    Collection Time: 05/10/19 12:43 PM   Result Value Ref Range    Glucose, POC 162 70 - 179 mg/dL   POCT Glucose    Collection Time: 05/10/19  1:16 PM   Result Value Ref Range    Glucose, POC 149 70 - 179 mg/dL       Relevant Studies/Radiology (if blank, then none):  Ecg 12 Lead    Result Date: 05/02/2019  NORMAL SINUS RHYTHM VOLTAGE CRITERIA FOR LEFT VENTRICULAR HYPERTROPHY T WAVE ABNORMALITY, CONSIDER LATERAL ISCHEMIA ABNORMAL ECG WHEN COMPARED WITH ECG OF 01-May-2019 23:01, NO SIGNIFICANT CHANGE WAS FOUND Confirmed by Gus Rankin ([id number]) on 05/02/2019 11:20:07 AM    Ecg 12 Lead    Result Date: 05/02/2019  NORMAL SINUS RHYTHM POSSIBLE LEFT ATRIAL ENLARGEMENT LEFT VENTRICULAR HYPERTROPHY NONSPECIFIC T WAVE ABNORMALITY ABNORMAL ECG WHEN COMPARED WITH ECG OF 01-May-2019 22:38, NO SIGNIFICANT CHANGE WAS FOUND Confirmed by Gus Rankin ([id number]) on 05/02/2019 11:19:42 AM    Ecg 12 Lead    Result Date: 05/02/2019  NORMAL SINUS RHYTHM POSSIBLE LEFT ATRIAL ENLARGEMENT LEFT VENTRICULAR HYPERTROPHY T WAVE ABNORMALITY, CONSIDER LATERAL ISCHEMIA ABNORMAL ECG WHEN COMPARED WITH ECG OF 03-Apr-2019 04:11, NONSPECIFIC T WAVE ABNORMALITY NOW EVIDENT IN INFERIOR LEADS T WAVE INVERSION NOW EVIDENT IN LATERAL LEADS Confirmed by Gus Rankin ([id number]) on 05/02/2019 11:19:07 AM    Ct Abdomen Pelvis Wo Contrast    Result Date: 05/02/2019 EXAM: CT ABDOMEN PELVIS WO CONTRAST DATE: 05/02/2019 3:33 AM ACCESSION: [id number] UN DICTATED: 05/02/2019 3:29 AM [facility]PRETATION LOCATION: [facility] CLINICAL INDICATION: s/p renal transplant with abdominal pain ; Abdominal pain, acute, nonlocalized  COMPARISON: CT abdomen/pelvis 03/29/2019. TECHNIQUE: A spiral CT scan of the abdomen and pelvis was obtained without IV contrast from the lung bases through the pubic symphysis. Images were reconstructed in the axial plane. Coronal and sagittal reformatted images were also provided for further evaluation. FINDINGS: Evaluation of the solid organs and vasculature is limited in the absence of intravenous contrast. LINES AND TUBES: None. LOWER THORAX: Annular calcifications. HEPATOBILIARY: No focal hepatic lesions. The gallbladder is distended to the upper limits of normal. No biliary dilatation.  SPLEEN: Unremarkable. PANCREAS: Unremarkable. ADRENALS: Unremarkable. KIDNEYS/URETERS: Bilateral cortical atrophy and lobular contour consistent with chronic medical renal disease. No hydronephrosis or hydroureter. No radiopaque calculi. Unchanged 1.2 cm hypodensity in the right kidney lower pole. Sequelae of left lower quadrant renal transplant. Double-J ureteral stent with proximal portion in the transplanted renal pelvis and distal portion within the bladder. No significant perinephric fluid collection. Mild nonspecific perinephric inflammatory stranding. BLADDER: Unremarkable. PELVIC/REPRODUCTIVE ORGANS: Surgically absent uterus. GI TRACT: Small hiatal hernia. No dilated or thick walled loops of bowel. Colonic diverticulosis without evidence of diverticulitis. PERITONEUM/RETROPERITONEUM AND MESENTERY: No free air or fluid. LYMPH NODES: No enlarged lymph nodes. VESSELS: Normal in caliber. Severe calcified atherosclerotic disease of the abdominal aorta and its branch vessels. BONES AND SOFT TISSUES: Surgical staples and surgical scar lower quadrant soft tissue. Multilevel degenerative disease of the spine. No aggressive osseous lesions.     Sequelae of left lower quadrant renal transplant without significant perinephric fluid collection identified. No evidence of acute intra-abdominal aortic pathology as clinically questioned. Gallbladder is hydropic. No other evidence to suggest acute cholecystitis. ==================== ADDENDUM (05/02/2019 7:24 AM): On review, the following additional findings were noted: 1. Thickening of the adrenal glands bilaterally without discrete nodule, concerning for adrenal hyperplasia.    Xr Chest Portable    Result Date: 05/02/2019  EXAM: XR CHEST PORTABLE DATE: 05/01/2019 11:06 PM ACCESSION: [id number] UN DICTATED: 05/01/2019 11:06 PM INTERPRETATION LOCATION: [facility] CLINICAL INDICATION: 63 years old Female with CHEST PAIN  COMPARISON: Chest radiograph 04/04/2019 TECHNIQUE: Portable Chest Radiograph. FINDINGS: Vascular stent overlies the left lung apex, unchanged. Radiographically clear lungs. No focal consolidation. No demonstrable pleural effusion or pneumothorax. Stable cardiomediastinal silhouette.     No acute airspace disease.    Ct Chest Wo Contrast    Result Date: 05/02/2019 EXAM: CT CHEST WO CONTRAST DATE: 05/02/2019 7:59 AM ACCESSION: [id number] UN DICTATED: 05/02/2019 8:04 AM INTERPRETATION LOCATION: [facility] CLINICAL INDICATION: 63 years old Female with r/o aortic dissection, can't receive contrast bc of renal transplant  COMPARISON: CT abdomen 07/07/2017. TECHNIQUE: A helical CT scan was obtained without IV contrast from the thoracic inlet through the hemidiaphragms. Images were reconstructed in the axial plane.  Coronal and sagittal reformatted images of the chest were also provided for further evaluation of the lung parenchyma. FINDINGS: AIRWAYS, LUNGS, PLEURA: Clear central tracheobronchial tree.  No lung consolidation.  0.3 cm left lower lobe nodule (image 57, series 2) unchanged compared to 07/07/2017. No pleural effusion. MEDIASTINUM: Examination is nondiagnostic for the specific indication of aortic dissection. No thoracic aortic intramural hematoma. No displaced intimal calcifications. No mediastinal hematoma or fluid collection. Dilated left ventricle. Small pericardial effusion. No mediastinal lymphadenopathy. Left subclavian vascular stent. IMAGED ABDOMEN: Atrophic kidneys. SOFT TISSUES: Unremarkable. BONES: Unremarkable.     Examination is nondiagnostic for the specific indication of aortic dissection due to the lack of intravenous contrast. No thoracic aortic intramural hematoma or displaced intimal calcifications. No mediastinal hematoma.    US Abdomen Complete    Result  Date: 05/02/2019 EXAM: US ABDOMEN COMPLETE DATE: 05/02/2019 4:41 PM ACCESSION: [id number] UN DICTATED: 05/02/2019 4:41 PM [facility]PRETATION LOCATION: [facility] CLINICAL INDICATION: 63 years old Female with RUQ pain, nausea, and vomiting  TECHNIQUE: Static and cine images of the complete abdomen were performed. COMPARISON: Same day CT abdomen and pelvis. Renal transplant ultrasound 04/28/2019. FINDINGS: LIVER: The liver is normal in echogenicity and contour. No focal hepatic lesions. No intrahepatic biliary ductal dilatation. The visualized common bile duct is normal in caliber. The distal common bile duct is not well-visualized due to overlying bowel gas. GALLBLADDER: The gallbladder is physiologically distended without internal stones or sludge. Sonographic Eulah Pont sign is negative.  No pericholecystic fluid. No gallbladder wall thickening. PANCREAS: Visualized portion is unremarkable. SPLEEN: Normal in size and echotexture. KIDNEYS: Limited evaluation due to adjacent bowel gas. Atrophic, echogenic bilateral native kidneys. No solid masses or calculi in the visualized kidneys. No hydronephrosis. No perinephric fluid collections. Limited evaluation of the left lower quadrant renal transplant demonstrates normal perfusion with power Doppler and no evidence of hydronephrosis.   VESSELS: Proximal aorta and IVC are not well-visualized due to overlying bowel gas. OTHER: No ascites. -Prominent gallbladder without evidence of cholelithiasis or biliary ductal dilatation.  Negative sonographic Murphys sign.  -Atrophic, echogenic native kidneys consistent with chronic medical renal disease.  Please see below for data measurements: Liver: 11.28 cm Common hepatic duct: 0.28 cm Proximal common bile duct: 0.57 cm Distal common bile duct: non vis Gallbladder wall:  0.22 cm Sonographic Murphy's Sign: negative Pericholecystic fluid visualized: no Right kidney: 6.85 cm Left kidney: 5.89 cm Aorta: not well visualized Inferior vena cava: not well visualized Spleen: 8.01 cm     US Renal Transplant W Doppler    Result Date: 05/09/2019 EXAM: US RENAL TRANSPLANT W DOPPLER DATE: 05/08/2019 5:04 PM ACCESSION: [id number] UN DICTATED: 05/08/2019 5:06 PM [facility]PRETATION LOCATION: [facility] CLINICAL INDICATION: 63 years old Female with Eval for rejection, aki on recent kidney t/p patient COMPARISON: Renal transplant ultrasound from 04/28/2019, CT abdomen pelvis from 05/02/2019 TECHNIQUE:  Ultrasound views of the renal transplant were obtained using gray scale and color and spectral Doppler imaging. Views of the urinary bladder were obtained using gray scale and limited color Doppler imaging. FINDINGS: TRANSPLANTED KIDNEY: The renal transplant was located in the left lower quadrant. Normal size and echogenicity.  No solid masses or calculi. No perinephric collections identified. Double-J ureteral stent with proximal tip within the renal pelvis and distal tip within the bladder. Mild caliectasis. No hydronephrosis. VESSELS: - Perfusion: Using power Doppler, normal perfusion was seen throughout the renal parenchyma, improved in the upper pole compared to the prior. - Resistive indices in the renal transplant are increased throughout the transplant kidney compared with prior examination measuring up to 0.98. - Main renal artery/iliac artery: Patent - Main renal vein/iliac vein: Patent BLADDER: Bladder is distended with a calculated volume of 277 mL. -Interval increase in resistive indices within the arcuate, segmental and main renal arteries measuring up to 0.98. Attention on follow-up. -Improved perfusion in the upper pole of the transplant kidney, now with normal perfusion throughout the renal parenchyma. -Distended bladder. The findings of this study were discussed via telephone with DR. Elmarie Shiley D LONG by Dr. Dennison Nancy on 05/08/2019 7:21 PM. Please see below for data measurements: Renal Transplant: length 10.9 cm; width 5.7 cm; height 4.4 cm Arcuate artery superior resistive index: 0.85 (previously 0.79) Arcuate artery mid resistive index: 0.88 (0.81) Arcuate artery inferior resistive index: 0.88 (0.86) Segmental artery superior resistive  index: 0.86 (0.79) Segmental artery mid resistive index: 0.94 (0.84) Segmental artery inferior resistive index: 0.93 (0.85) Main renal artery hilum resistive index: 0.98 (0.85) Main renal artery mid resistive index: 0.98 (0.86) Main renal artery anastomosis resistive index: 0.97 (0.82) Main renal vein: patent Iliac artery: Patent Iliac vein: Patent Bladder volume prevoid: 277  mL     Ed Pocus Chest Bilateral    Result Date: 05/02/2019 Limited Cardiac Ultrasound (CPT 04540-98) Indication: A focused ultrasound exam of the heart was performed to evaluate for pericardial effusion, tamponade, severe hypovolemia, or gross abnormalities of cardiac anatomy or function in this patient. The ultrasound was performed with the following indications, as noted in the H&P: Chest pain and Other indications as noted in the H&P Identified structures: The pericardial sac, myocardium, and 4 chambers were identified using the following views: parasternal long axis, parasternal short axis and apical 4-chamber Findings: Exam of the above structures revealed the following findings:  Pericardial effusion: Absent   Pericardial tamponade: N/A  Global LV function: Normal  Right ventricular size: Normal   Signs of RV strain: N/A  IVC: N/A  Other findings: MV is moderately thickened and calcified Limitations: None. Impression: No sonographic evidence of significant cardiac dysfunction, No sonographic evidence of significant pericardial effusion and Normal RV Other: n/a Interpreted by: Sondra Barges Bonhomme Quality Assurance  After review of the point-of-care ultrasound performed in this case I assess the overall image quality as: Image quality: Minimal criteria met for diagnosis, all structures imaged well and diagnosis easily supported  The accuracy of interpretation of images as presented reflects a true positive. This study does  meet minimum criteria for credentialing and billing. Robbie Lis, MD     Xr Abdomen 1 View    Result Date: 05/05/2019 EXAM: XR ABDOMEN 1 VIEW DATE: 05/05/2019 3:13 PM ACCESSION: [id number] UN DICTATED: 05/05/2019 3:16 PM [facility]PRETATION LOCATION: [facility] CLINICAL INDICATION: 63 years old Female with CONSTIPATION  COMPARISON: 05/02/2019 CT TECHNIQUE: Supine view of the abdomen. FINDINGS: Gas-filled splenic flexure with otherwise paucity of bowel gas. Moderate stool burden. Stent overlies the left pelvis with adjacent overlying surgical staples and clips. No acute osseous abnormality. Vascular calcifications.     Moderate stool burden.    ______________________________________________________________________  Discharge Instructions:   Activity Instructions     Activity as tolerated                Other Instructions     Call MD for:  difficulty breathing, headache or visual disturbances      Call MD for:  persistent nausea or vomiting      Call MD for:  severe uncontrolled pain      Call MD for:  temperature >38.5 Celsius      Discharge instructions      You were admitted to [facility] with nausea and vomiting due to esophagitis. Gastroenterology performed an EGD and took biopsies of ulcers in your esophagus and top part of your stomach. Biopsy results did not show any evidence of infection or concern for cancerous process. It is most likely due to severe reflux causing esophagitis. We treated you with aggressive symptom control as it will take time for these ulcers to heal. We also monitored your kidney function to ensure you were able to stay adequately hydrated.    Please take your medications as prescribed. Medication changes are listed below and a full list of medications is in this discharge packet. Please keep your follow-up appointments after the hospital for ongoing care.  It has been a pleasure taking care of you, we wish you the best.     MEDICATION CHANGES:  - For your esophagitis:   - Use lidocaine solution every four hours as needed   - Take sucralfate suspension every six hours as needed - Continue taking pantoprazole 40mg  twice each day   - Take phenergan 25mg  as needed for nausea   - Take metoclopramide 5mg  three times each day   - Take mirtazapine 15mg  each night for appetite and sleep  - Continue to take Myfortic: take one 360mg  tablet three times each day  - Continue taking tacrolimus (Envarsus) 11mg  each day  - You may stop taking Lasix  - Continue your other home medications    FOLLOW-UP:  - You will see [name] on Tuesday, December 8th. Your transplant coordinator will communicate the appointment time with you when it is arranged.               Resources and Referrals     The below services (PT/OT) have been ordered for you by your medical team to help your health and safety at home.    If Home Health services have been arranged, the agency listed below will be contacting you to set up a time for them to come see you in your home within 2 days of your discharge.  If you have not heard from them prior to 05/06/19 or you have any questions about home health, please contact them at the phone number listed below.        Selected Continued Care - Admitted Since 05/01/2019     [facility] Coordination complete    Service Provider Selected Services Address Phone Fax Patient Preferred    [facility] ??[address] [place], [place] [address] [phone] ???                Please contact your Post-Transplant Coordinator if you have any problems/concerns:  Philis Nettle [phone]; [phone]) [phone]                  Follow Up instructions and Outpatient Referrals     Ambulatory referral to Home Health      Is this a [place] [facility] [place] [facility] referral?: No    Physician to follow patient's care: PCP    Disciplines requested:  Physical Therapy  Occupational Therapy       Physical Therapy requested:  Strengthening exercises  Evaluate and treat       Requested start of care date: Day after Discharge/Referral Call MD for:  difficulty breathing, headache or visual disturbances      Call MD for:  persistent nausea or vomiting      Call MD for:  severe uncontrolled pain      Call MD for:  temperature >38.5 Celsius      Discharge instructions            Appointments which have been scheduled for you    Jun 13, 2019  2:00 PM  (Arrive by 1:45 PM)  [facility]ST[facility] LOCAL with Turner Daniels, MD  [facility] UROLOGY PROCEDURES [facility]) [address]  [place] [address]  [phone]      Jun 20, 2019 11:30 AM  (Arrive by 11:00 AM)  RETURN  HEPATOLOGY with Janyth Pupa, MD  [facility] [place] [facility]) [address]  [place] [address]  [phone]           ______________________________________________________________________  Discharge Day Services:  BP 126/58  - Pulse 66  - Temp 36.3 ??C (Oral)  - Resp 16  - Wt 76.3 kg (168 lb 4.8 oz)  - SpO2 95%  - BMI 26.36 kg/m??   Pt seen on the day of discharge and determined appropriate for discharge.    Condition at Discharge: stable    Length of Discharge: I spent greater than 30 mins in the discharge of this patient.

## 2019-05-10 NOTE — Unmapped (Signed)
Attempted to call sister to let her know that pt would remain admitted for at least another day.    Will try to call her again in the AM

## 2019-05-10 NOTE — Unmapped (Signed)
Pt alert and oriented x4. Pt has been afebrile with stable VS. Pt with c/o pain. PRN Oxy given per orders. Falls precautions and safety maintained. Pt is awaiting to be discharged to home and awaiting for sister to arrive for pick up. WCTM.         Problem: Adult Inpatient Plan of Care  Goal: Plan of Care Review  Outcome: Ongoing - Unchanged  Goal: Patient-Specific Goal (Individualization)  Outcome: Ongoing - Unchanged  Goal: Absence of Hospital-Acquired Illness or Injury  Outcome: Ongoing - Unchanged  Goal: Optimal Comfort and Wellbeing  Outcome: Ongoing - Unchanged  Goal: Readiness for Transition of Care  Outcome: Ongoing - Unchanged  Goal: Rounds/Family Conference  Outcome: Ongoing - Unchanged     Problem: COPD Comorbidity  Goal: Maintenance of COPD Symptom Control  Outcome: Ongoing - Unchanged     Problem: Diabetes Comorbidity  Goal: Blood Glucose Level Within Desired Range  Outcome: Ongoing - Unchanged     Problem: Hypertension Comorbidity  Goal: Blood Pressure in Desired Range  Outcome: Ongoing - Unchanged     Problem: Wound  Goal: Optimal Wound Healing  Outcome: Ongoing - Unchanged     Problem: Self-Care Deficit  Goal: Improved Ability to Complete Activities of Daily Living  Outcome: Ongoing - Unchanged     Problem: Fall Injury Risk  Goal: Absence of Fall and Fall-Related Injury  Outcome: Ongoing - Unchanged

## 2019-05-10 NOTE — Unmapped (Signed)
Patient afebrile and vital signs stable during shift.  Patient voiding on her own with moderate output--300 mL post void when bladder scanned.  Patient given PRN oxycodone 2X for pain along with scheduled viscous lidocaine.  Fluids going at 100 mL/hr as ordered.  Covered with sliding scale insulin when needed.  No falls.  Will continue to monitor.

## 2019-05-11 MED ORDER — TACROLIMUS ER 1 MG TABLET,EXTENDED RELEASE 24 HR
ORAL_TABLET | Freq: Every day | ORAL | 3 refills | 30.00000 days | Status: CP
Start: 2019-05-11 — End: 2019-06-10

## 2019-05-11 NOTE — Telephone Encounter (Signed)
Patient was discharged on 05/10/19 from [facility] States she is following up with [facility] for staple removal next week and stent removal shortly thereafter. Reports she is doing well and her sister is staying in the home with her to assist, appetite is good, BM/voiding without issues, taking medications without issues. Denies any additional concerns that are not being addressed by surgery and declines PMD hospital follow up at this time. She plans to keep appointment scheduled 06/16/19 for follow up. I encouraged patient to call the office with any additional questions or concerns to be addressed by pcp.

## 2019-05-11 NOTE — Unmapped (Signed)
[facility] Refill Coordination Note    Specialty Medication(s) to be Shipped:   Transplant: valgancyclovir 450mg     Other medication(s) to be shipped: amlodipine 10mg , clonodine 0.1mg , pregabalin 75mg , and bactrim     Michele Cisneros, DOB: 1955/10/01  Phone: [phone] (home)       All above HIPAA information was verified with patient.     Completed refill call assessment today to schedule patient's medication shipment from the [facility] [phone]).       Specialty medication(s) and dose(s) confirmed: Regimen is correct and unchanged.   Changes to medications: [name] reports no changes at this time.  Changes to insurance: No  Questions for the pharmacist: No    Confirmed patient received Welcome Packet with first shipment. The patient will receive a drug information handout for each medication shipped and additional FDA Medication Guides as required.       DISEASE/MEDICATION-SPECIFIC INFORMATION        N/A    SPECIALTY MEDICATION ADHERENCE     Medication Adherence    Patient reported X missed doses in the last month: 0  Specialty Medication: valGANciclovir 450 mg tablet (VALCYTE)  Patient is on additional specialty medications: No          Valgaciclovir 450 mg: 4 days of medicine on hand     SHIPPING     Shipping address confirmed in Epic.     Delivery Scheduled: Yes, Expected medication delivery date: 05/13/2019.     Medication will be delivered via UPS to the prescription address in Epic WAM.    Michele Cisneros [facility] Specialty Technician

## 2019-05-12 DIAGNOSIS — Z940 Kidney transplant status: Principal | ICD-10-CM

## 2019-05-12 DIAGNOSIS — Z79899 Other long term (current) drug therapy: Principal | ICD-10-CM

## 2019-05-13 MED FILL — VALGANCICLOVIR 450 MG TABLET: ORAL | 30 days supply | Qty: 30 | Fill #0

## 2019-05-13 MED FILL — AMLODIPINE 10 MG TABLET: ORAL | 30 days supply | Qty: 30 | Fill #0

## 2019-05-13 MED FILL — PREGABALIN 75 MG CAPSULE: 30 days supply | Qty: 60 | Fill #0 | Status: AC

## 2019-05-13 MED FILL — CLONIDINE HCL 0.1 MG TABLET: 30 days supply | Qty: 180 | Fill #0 | Status: AC

## 2019-05-13 MED FILL — SULFAMETHOXAZOLE 400 MG-TRIMETHOPRIM 80 MG TABLET: ORAL | 28 days supply | Qty: 12 | Fill #0

## 2019-05-13 MED FILL — CLONIDINE HCL 0.1 MG TABLET: ORAL | 30 days supply | Qty: 180 | Fill #0

## 2019-05-13 MED FILL — VALGANCICLOVIR 450 MG TABLET: 30 days supply | Qty: 30 | Fill #0 | Status: AC

## 2019-05-13 MED FILL — AMLODIPINE 10 MG TABLET: 30 days supply | Qty: 30 | Fill #0 | Status: AC

## 2019-05-13 MED FILL — SULFAMETHOXAZOLE 400 MG-TRIMETHOPRIM 80 MG TABLET: 28 days supply | Qty: 12 | Fill #0 | Status: AC

## 2019-05-13 NOTE — Unmapped (Signed)
Spoke to sister who had questions about upcoming apts.    Patient needs f/u with nephrology.    Has home PT will do labs Monday.    Patient is eating and drinking good.    Let her sister know that we will reach out to schedule new apt times

## 2019-05-14 DIAGNOSIS — I120 Hypertensive chronic kidney disease with stage 5 chronic kidney disease or end stage renal disease: Secondary | ICD-10-CM | POA: Diagnosis not present

## 2019-05-14 DIAGNOSIS — Z4822 Encounter for aftercare following kidney transplant: Secondary | ICD-10-CM | POA: Diagnosis not present

## 2019-05-14 DIAGNOSIS — Z87891 Personal history of nicotine dependence: Secondary | ICD-10-CM | POA: Diagnosis not present

## 2019-05-14 DIAGNOSIS — E7849 Other hyperlipidemia: Secondary | ICD-10-CM | POA: Diagnosis not present

## 2019-05-14 DIAGNOSIS — Z7982 Long term (current) use of aspirin: Secondary | ICD-10-CM | POA: Diagnosis not present

## 2019-05-14 DIAGNOSIS — Z792 Long term (current) use of antibiotics: Secondary | ICD-10-CM | POA: Diagnosis not present

## 2019-05-14 DIAGNOSIS — Z992 Dependence on renal dialysis: Secondary | ICD-10-CM | POA: Diagnosis not present

## 2019-05-14 DIAGNOSIS — Z7951 Long term (current) use of inhaled steroids: Secondary | ICD-10-CM | POA: Diagnosis not present

## 2019-05-14 DIAGNOSIS — E1122 Type 2 diabetes mellitus with diabetic chronic kidney disease: Secondary | ICD-10-CM | POA: Diagnosis not present

## 2019-05-14 DIAGNOSIS — Z9181 History of falling: Secondary | ICD-10-CM | POA: Diagnosis not present

## 2019-05-14 DIAGNOSIS — N186 End stage renal disease: Secondary | ICD-10-CM | POA: Diagnosis not present

## 2019-05-16 DIAGNOSIS — Z79899 Other long term (current) drug therapy: Principal | ICD-10-CM

## 2019-05-16 DIAGNOSIS — Z940 Kidney transplant status: Principal | ICD-10-CM

## 2019-05-16 LAB — HEMATOCRIT: Hematocrit:VFr:Pt:Bld:Qn:Automated count: 27.5 — ABNORMAL LOW

## 2019-05-16 LAB — CBC W/ DIFFERENTIAL
BANDED NEUTROPHILS ABSOLUTE COUNT: 0.1 10*3/uL (ref 0.0–0.1)
BASOPHILS ABSOLUTE COUNT: 0.1 10*3/uL (ref 0.0–0.2)
BASOPHILS RELATIVE PERCENT: 1 %
EOSINOPHILS ABSOLUTE COUNT: 0.2 10*3/uL (ref 0.0–0.4)
EOSINOPHILS RELATIVE PERCENT: 3 %
HEMOGLOBIN: 8.8 g/dL — ABNORMAL LOW (ref 11.1–15.9)
IMMATURE GRANULOCYTES: 1 %
LYMPHOCYTES ABSOLUTE COUNT: 0.1 10*3/uL — ABNORMAL LOW (ref 0.7–3.1)
LYMPHOCYTES RELATIVE PERCENT: 1 %
MEAN CORPUSCULAR HEMOGLOBIN CONC: 32 g/dL (ref 31.5–35.7)
MEAN CORPUSCULAR HEMOGLOBIN: 31.2 pg (ref 26.6–33.0)
MEAN CORPUSCULAR VOLUME: 98 fL — ABNORMAL HIGH (ref 79–97)
MONOCYTES RELATIVE PERCENT: 5 %
NEUTROPHILS ABSOLUTE COUNT: 6.8 10*3/uL (ref 1.4–7.0)
NEUTROPHILS RELATIVE PERCENT: 89 %
PLATELET COUNT: 308 10*3/uL (ref 150–450)
RED BLOOD CELL COUNT: 2.82 x10E6/uL — ABNORMAL LOW (ref 3.77–5.28)
RED CELL DISTRIBUTION WIDTH: 15.8 % — ABNORMAL HIGH (ref 11.7–15.4)
WHITE BLOOD CELL COUNT: 7.6 10*3/uL (ref 3.4–10.8)

## 2019-05-16 MED ORDER — TACROLIMUS ER 1 MG TABLET,EXTENDED RELEASE 24 HR
ORAL_TABLET | 11 refills | 0 days | Status: CP
Start: 2019-05-16 — End: ?

## 2019-05-16 MED ORDER — LIDOCAINE HCL 2 % MUCOSAL SOLUTION
OROMUCOSAL | 5 refills | 23.00000 days | Status: CP
Start: 2019-05-16 — End: ?
  Filled 2019-05-19: qty 2000, 23d supply, fill #0

## 2019-05-16 MED ORDER — TACROLIMUS ER 4 MG TABLET,EXTENDED RELEASE 24 HR
ORAL_TABLET | 11 refills | 0 days | Status: CP
Start: 2019-05-16 — End: ?

## 2019-05-16 NOTE — Unmapped (Signed)
Per Maurice Small D increase envarsus to 13mg  daily    Called patient and left her a VM and also let her know I would call her sister Dalphine Handing verbalized understanding to do 3 of the 4mg  tabs and 1 of 1mg . Reports patient is doing well.  Eating good and getting her strength back    Confirmed upcoming apt times.    Rosa states she will need a refill on her lidocaine. Looks like it was sent to [facility] OP so I sent refill to [facility].

## 2019-05-17 DIAGNOSIS — Z940 Kidney transplant status: Principal | ICD-10-CM

## 2019-05-17 DIAGNOSIS — Z79899 Other long term (current) drug therapy: Principal | ICD-10-CM

## 2019-05-17 DIAGNOSIS — E1122 Type 2 diabetes mellitus with diabetic chronic kidney disease: Secondary | ICD-10-CM | POA: Diagnosis not present

## 2019-05-17 DIAGNOSIS — E7849 Other hyperlipidemia: Secondary | ICD-10-CM | POA: Diagnosis not present

## 2019-05-17 DIAGNOSIS — N186 End stage renal disease: Secondary | ICD-10-CM | POA: Diagnosis not present

## 2019-05-17 DIAGNOSIS — I120 Hypertensive chronic kidney disease with stage 5 chronic kidney disease or end stage renal disease: Secondary | ICD-10-CM | POA: Diagnosis not present

## 2019-05-17 DIAGNOSIS — Z992 Dependence on renal dialysis: Secondary | ICD-10-CM | POA: Diagnosis not present

## 2019-05-17 DIAGNOSIS — Z4822 Encounter for aftercare following kidney transplant: Secondary | ICD-10-CM | POA: Diagnosis not present

## 2019-05-17 LAB — RENAL FUNCTION PANEL
ALBUMIN: 3.7 g/dL — ABNORMAL LOW (ref 3.8–4.8)
BLOOD UREA NITROGEN: 11 mg/dL (ref 8–27)
BUN / CREAT RATIO: 9 — ABNORMAL LOW (ref 12–28)
CHLORIDE: 110 mmol/L — ABNORMAL HIGH (ref 96–106)
CO2: 18 mmol/L — ABNORMAL LOW (ref 20–29)
CREATININE: 1.16 mg/dL — ABNORMAL HIGH (ref 0.57–1.00)
GFR MDRD NON AF AMER: 51 mL/min/{1.73_m2} — ABNORMAL LOW
PHOSPHORUS, SERUM: 3.6 mg/dL (ref 3.0–4.3)
SODIUM: 141 mmol/L (ref 134–144)

## 2019-05-17 LAB — POTASSIUM: Potassium:SCnc:Pt:Ser/Plas:Qn:: 4.2

## 2019-05-17 LAB — MAGNESIUM: Magnesium:MCnc:Pt:Ser/Plas:Qn:: 1.4 — ABNORMAL LOW

## 2019-05-17 LAB — TACROLIMUS BLOOD: Tacrolimus:MCnc:Pt:Bld:Qn:LC/MS/MS: 8.8

## 2019-05-17 MED ORDER — TACROLIMUS ER 4 MG TABLET,EXTENDED RELEASE 24 HR: 12 mg | tablet | Freq: Every day | 11 refills | 30 days | Status: AC

## 2019-05-17 MED ORDER — TACROLIMUS ER 1 MG TABLET,EXTENDED RELEASE 24 HR
ORAL_TABLET | Freq: Every day | ORAL | 11 refills | 30.00000 days | Status: CP
Start: 2019-05-17 — End: 2019-05-17

## 2019-05-17 MED ORDER — TACROLIMUS ER 4 MG TABLET,EXTENDED RELEASE 24 HR
ORAL_TABLET | Freq: Every day | ORAL | 11 refills | 30.00000 days | Status: CP
Start: 2019-05-17 — End: 2019-05-17

## 2019-05-17 MED ORDER — TACROLIMUS ER 1 MG TABLET,EXTENDED RELEASE 24 HR: 1 mg | tablet | Freq: Every day | 11 refills | 30 days | Status: AC

## 2019-05-17 NOTE — Unmapped (Signed)
Change in Envarsus dosage to 13 mg daily. Co-pay $0.00 for both 1 and 4 mg tablets. Last filled 04/06/2019.

## 2019-05-18 NOTE — Unmapped (Signed)
Patient called to review Friday apt at [facility].  Also needs more lidocaine. Let her know I sent in a refill to [facility]. She will call them    Will have labs Friday at the [facility]

## 2019-05-18 NOTE — Unmapped (Signed)
[facility] Refill Coordination Note    Specialty Medication(s) to be Shipped:   Transplant: Envarsus 1mg  and Envarsus 4mg     Other medication(s) to be shipped:   Carvedilol  Metformin  Pantoprazole    **Dose increase on Envarsus to 13mg  Daily. PT notified by coordinator via 05/16/19 note.      Michele Cisneros, DOB: Apr 06, 1956  Phone: [phone] (home)       All above HIPAA information was verified with patient.     Was a Nurse, learning disability used for this call? No    Completed refill call assessment today to schedule patient's medication shipment from the [facility] [phone]).       Specialty medication(s) and dose(s) confirmed: Dose increase on Envarsus to 13mg    Changes to medications: [name] reports no changes at this time.  Changes to insurance: No  Questions for the pharmacist: No    Confirmed patient received Welcome Packet with first shipment. The patient will receive a drug information handout for each medication shipped and additional FDA Medication Guides as required.       DISEASE/MEDICATION-SPECIFIC INFORMATION        N/A    SPECIALTY MEDICATION ADHERENCE     Medication Adherence    Patient reported X missed doses in the last month: 0        Envarsus 1mg : 10 days worth of medication on hand.    Envarsus 4mg : 10 days worth of medication on hand.            SHIPPING     Shipping address confirmed in Epic.     Delivery Scheduled: Yes, Expected medication delivery date: 05/25/19.     Medication will be delivered via UPS to the prescription address in Epic WAM.    [name]   [facility] Specialty Technician

## 2019-05-19 ENCOUNTER — Ambulatory Visit: Payer: Medicare Other | Admitting: Podiatry

## 2019-05-19 DIAGNOSIS — Z79899 Other long term (current) drug therapy: Principal | ICD-10-CM

## 2019-05-19 DIAGNOSIS — Z940 Kidney transplant status: Principal | ICD-10-CM

## 2019-05-19 MED FILL — LIDOCAINE VISCOUS 2 % MUCOSAL SOLUTION: 23 days supply | Qty: 2000 | Fill #0 | Status: AC

## 2019-05-19 NOTE — Unmapped (Signed)
Transplant Nephrology Clinic Visit      History of Present Illness    63 y.o. female here for follow up after kidney transplantation.  Patient has ESRD secondary to DM II.  Patient has been on dialysis since 02/07/2014.  Patient's history includes HTN, CAD, CHF, HLD, COPD, PVD, sickle cell trait, and chronic back pain.     Patient was admitted for kidney transplant on 03/28/2019.  She received 1 unit pRBC on 03/30/2019 and 04/03/2019.  Patient had delayed graft function after transplant, but no dialysis was needed. RUS on 03/30/2019 showed 1.8 cm hypoechoic fluid collection abutting the left lower pole of the transplant kidney likely representing postsurgical seroma.  Patient discharged on 04/06/2019 to her home.       Transplant History:    Organ Received: Left DDKT, DBD, SCD, KDPI: 70%  Resp culture: + strep pneumo  Native Kidney Disease: DM II; cPRA 49%  Date of Transplant: 03/30/2019  Post-Transplant Course: DGF  Prior Transplants: No  Induction: [place]  Date of Ureteral Stent Removal: 06/13/2019  Current Immunosuppression: Tacrolimus/Myfortic  CMV/EBV Status: CMV D-/R+  EBV D+/R+  Rejection Episodes: None  Donor Specific Antibodies: None  Results of Renal Imaging (pre and post):      Pre-Txp 07/07/17  -Kidneys are small with mildly increased echogenicity bilaterally. No solid masses or calculi in the visualized kidneys. No hydronephrosis    CT Abdomen/Pelvis w/o Contrast 03/29/2019 (pre-txp)  -1.2 cm hypodensity in the right kidney lower pole (2:48), increased in size from prior however incompletely characterized given lack of intravenous contrast.  -Uterus is surgically absent.    Post-Txp 05/08/2019 (txp kidney only)  --The renal transplant was located in the left lower quadrant. Normal size and echogenicity.  No solid masses or calculi. No perinephric collections identified. Double-J ureteral stent with proximal tip within the renal pelvis and distal tip within the bladder. Mild caliectasis. No hydronephrosis. --Interval increase in resistive indices within the arcuate, segmental and main renal arteries measuring up to 0.98. Attention on follow-up.  --Improved perfusion in the upper pole of the transplant kidney, now with normal perfusion throughout the renal parenchyma.    Current Immunosuppression Regimen:   Envarsus 13 mg daily  Myfortic 540 mg BID      Subjective/Interval:   Since last visit patient was admitted to the hospital 05/01/2019-05/10/2019 with chest pain, nausea and vomiting. ACS/MI ruled out.  EGD 05/04/19 showed several esophageal ulcers and some erythematous gastric mucosa in the antrum with linear erosion. Urine toxicology showed cannabinoids.  H. Pylori negative. Treating ulcers with aggressive symptom control.  Patient missed stent removal appointment due to admission, but it has been rescheduled to 06/13/19.     Patient presents complaining of nasal drainage with post nasal drip.  She states she isn't congested.  Patient had a fever three days ago that was 100.3.  She had diarrhea yesterday and has had episodes of vomiting yesterday.  While in clinic today she vomited.  She is having dyspnea with exertion. Patient taking the Carafate and lidocaine solution, but ran out of the lidocaine but will receive it via mail today. She states that her appetite is good, but ever since she ran out of the lidocaine she states it has been hard to eat.  She continues to have tremors.  She continues to have a headache 2-3 times a week, but hasn't seen Neurology recently. They had technical difficulties during the scheduled virtual visit.  Home BP running 180-190 systolic on her cuff, but  when Physical Therapy is there and they check her BP it is 130's systolic.  Believe her cuff is too large.  Patient will purchase a smaller size cuff for accurate BP measures. Patient states she has some blurry vision, but doesn't wear her glasses as prescribed nor has she seen an eye doctor recently. Patient denies lightheadedness, dizziness, abdominal pain, dysuria, hematuria, burning with urination, discharge, and edema.     Last dose of Prograf: 9:00 AM    Past Medical History  1. DM II  2. HTN  3. HLD  4. CAD  5. CHF  6. COPD  7. PVD  8. Sickle Cell Trait  9. DDD with chronic pain    Review of Systems    Otherwise as per HPI, all other systems reviewed and are negative.    Medications  Current Outpatient Medications   Medication Sig Dispense Refill   ??? amLODIPine (NORVASC) 10 MG tablet Take 1 tablet (10 mg total) by mouth daily. 30 tablet 11   ??? aspirin (ECOTRIN) 81 MG tablet Take 81 mg by mouth daily.     ??? atorvastatin (LIPITOR) 40 MG tablet Take 1 tablet (40 mg total) by mouth daily. 30 tablet 11   ??? cloNIDine HCL (CATAPRES) 0.1 MG tablet Take 2 tablets (0.2 mg total) by mouth every eight (8) hours. 180 tablet 11   ??? lidocaine 2% viscous (XYLOCAINE) 2 % Soln Use 15 mL by Mouth as directed Every four (4) hours. 2000 mL 5   ??? metoclopramide (REGLAN) 5 MG tablet Take 1 tablet (5 mg total) by mouth Three (3) times a day before meals. 90 tablet 0   ??? mirtazapine (REMERON) 15 MG tablet Take 1 tablet (15 mg total) by mouth nightly. 90 tablet 3   ??? mycophenolate (MYFORTIC) 360 MG TbEC Take 1 tablet (360 mg total) by mouth Three (3) times a day. 90 tablet 11   ??? pantoprazole (PROTONIX) 40 MG tablet Take 1 tablet (40 mg total) by mouth Two (2) times a day. 60 tablet 11   ??? pregabalin (LYRICA) 75 MG capsule Take 1 capsule (75 mg total) by mouth Two (2) times a day. 60 capsule 3   ??? sucralfate (CARAFATE) 100 mg/mL suspension Take 10 mL (1 g total) by mouth Every six (6) hours. 1200 mL 0   ??? sulfamethoxazole-trimethoprim (BACTRIM) 400-80 mg per tablet Take 1 tablet (80 mg of trimethoprim total) by mouth Every Monday, Wednesday, and Friday. 12 tablet 5   ??? tacrolimus (ENVARSUS XR) 1 mg Tb24 extended release tablet Take 1 tablet (1 mg) by mouth daily with 3 (4mg ) tablets for total 13mg  daily dose. 30 tablet 11 ??? valGANciclovir (VALCYTE) 450 mg tablet Take 1 tablet (450 mg total) by mouth daily. 30 tablet 2   ??? acetaminophen (TYLENOL) 500 MG tablet Take 1-2 tablets (500-1,000 mg total) by mouth every six (6) hours as needed for pain or fever (> 38C or 100.79F). 30 tablet 0   ??? albuterol (PROVENTIL HFA;VENTOLIN HFA) 90 mcg/actuation inhaler Inhale 2 puffs every six (6) hours as needed for wheezing.     ??? blood sugar diagnostic (CONTOUR TEST STRIPS) Strp by Other route two (2) times a day. TEST BLOOD SUGARS 2 TIMES DAILY 60 strip 5   ??? blood-glucose meter (ONETOUCH ULTRA SYSTEM KIT) kit Please dispense for patient to use at home to check blood sugars.     ??? carvediloL (COREG) 12.5 MG tablet Take 3 tablets (37.5 mg total) by mouth Two (2) times  a day. 180 tablet 11   ??? LANCETS,THIN MISC Using at home to test blood sugars twice daily.     ??? magnesium oxide-Mg AA chelate (MAGNESIUM, AMINO ACID CHELATE,) 133 mg Tab Take 1 tablet by mouth Two (2) times a day. 100 tablet 11   ??? metFORMIN (GLUCOPHAGE XR) 500 MG 24 hr tablet Take 1 tablet (500 mg total) by mouth two (2) times a day. 60 tablet 11   ??? nitroglycerin (NITROSTAT) 0.4 MG SL tablet Place 0.4 mg under the tongue daily as needed.     ??? polyethylene glycol (GLYCOLAX) 17 gram/dose powder Mix 1 capful (17 g) in 4 to 8 ounces water, juice, soda, coffee, or tea and drink daily as needed. 510 g 0   ??? promethazine (PHENERGAN) 25 MG tablet Take 1 tablet (25 mg total) by mouth every six (6) hours as needed for up to 28 doses. 30 each 3   ??? senna (SENOKOT) 8.6 mg tablet Take 1 tablet by mouth nightly as needed for constipation. 30 tablet 11   ??? tacrolimus (ENVARSUS XR) 4 mg Tb24 extended release tablet Take 3 tablets (12 mg) by mouth daily with 1 (1 mg) tablets for total 13mg  daily dose. 90 tablet 11     No current facility-administered medications for this visit.            Physical Exam BP 149/62 (BP Site: R Arm, BP Position: Sitting, BP Cuff Size: Medium) Comment: 142/54 manually - Pulse 94  - Temp 36.5 ??C (97.7 ??F) (Temporal)  - Resp 14  - Wt 71.3 kg (157 lb 3.2 oz)  - BMI 24.62 kg/m??   General: no acute distress  HEENT: wearing mask, PERRL  Neck: neck supple, no cervical lymphadenopathy appreciated  CV: normal rate, normal rhythm, no murmur, no gallops, no rubs appreciated  Lungs: clear to auscultation bilaterally  Abdomen: soft, tender over graft site;  Staples present, c/d/i; no drainage or odor noted; no erythema; stitch present at LLQ JP insertion site.  Extremities: no edema  Musculoskeletal: no visible deformity, normal range of motion.  Pulses: intact distally throughout  Neurologic: awake, alert, and oriented x3    Stitch removed.  Staples removed by Transplant Nurse Coordinator.     Laboratory Data and Imaging reviewed in EPIC      Assessment:  63 y.o. female status post deceased donor kidney transplant on 03/30/2019 for ESRD secondary to DM II who presents for routine follow up and post-transplant care.         Recommendations/Plan:     Allograft Function: Renal function stable at 1.12 after continuing to decrease since transplant.   UPC normal at 0.258    Immunosuppression Management [High Risk Medical Decision Making For Drug Therapy Requiring Intensive Monitoring For Toxicity]: Tacrolimus trough level 4.7 (14 hour trough). Tacrolimus 13 mg daily.  Targeting tacrolimus trough levels of approximately 8-10 ng/mL.   Continue mycophenolate 540 mg BID.     Blood Pressure Management: BP 149/62 at this visit. Continue current regimen. Will continue to monitor BP.  Patient to get a smaller cuff.      Lipid Management: Last lipid panel on 05/20/19.  Continue atorvastatin.      Electrolytes: Electrolyte and metabolic parameters in acceptable range.  Will continue to monitor.  Mag 1.1 today. Restart magnesium supplement. Infectious Prophylaxis and Monitoring: CMV VL not detected 05/04/19, today's pending.  BK VL not detected 04/11/2019, today's pending.  Will continue to monitor.   The patient continues on Valcyte (  end 06/29/19) and Bactrim (end 09/28/19) prophylaxis.   Hep C antibody came back positive on 03/28/19.  HCV Ab grayzone 04/25/2019 with HCV VL not detected same day.    Pulmonary Nodule: Seen on NM Stress 07/07/17.  Subcentimeter nodule is stable from previous study 03/18/16 and 03/07/14    Lumbar Radiculopathy: Following with [facility] with next appt 04/26/2019 with MRI of spine.  Patient states she is going to change it to a virtual appointment.  Chronic low back pain with numbness to BLE at time.  Sometimes Right foot drags as well.  Plan is for surgery in the future for Lumbosacral DDD.     Right Breast Soft Tissue Mass: no size change since 03/18/2016.  Needs an updated mammogram.    Neuropathy: Continue on Lyrica.  Follows with [facility], last appt 03/17/2019.      LLQ Pain: Resolved with Ditropan.     GERD: Protonix to BID.     Esophageal/Stomach Ulcers: Protonix BID, Lidocaine solution, Carafate.  GI follow up 06/20/19    Possible Covid: Patient getting tested today at [facility]  Patient to self-quarantine till results provided.      Health Maintenance:   Mammo: 2018, needs  Pap Smear: Hysterectomy  Colonoscopy: 08/20/2017; repeat 5 years due to multiple tubular adenomas    Immunizations:   Flu Shot: 03/18/2019  Prevnar 13: not noted in records from 2012 and after.  Will get after 1 year post transplant  Pneumovax: 03/23/18    Counseling:  I counseled the patient on:  The need to avoid sun exposure and the use of sunblock while outdoors given the relatively higher risk of skin malignancy in an immunosuppressed state.  The need for adherence to immunosuppression medication.  Patient verbalized understanding.     Follow-Up:  Return to clinic 4 weeks  Labs: 2 times a week Patient to reschedule Neurology Appt due to technical issues during 04/26/19 virtual visit.     Patient will continue to follow-up with his primary care provider for non-transplant related issues and medication refills. We have ordered transplant specific labs per the center's guidelines to monitor and assess for toxicities from immunosuppressant drug therapy.        Shamyah Stantz L. Marina Goodell, MSN, APRN, AGNP-C - Kidney Transplant Nurse Practitioner  [facility] for Transplant Care

## 2019-05-20 ENCOUNTER — Encounter: Admit: 2019-05-20 | Discharge: 2019-05-20 | Payer: MEDICARE

## 2019-05-20 ENCOUNTER — Ambulatory Visit: Admit: 2019-05-20 | Discharge: 2019-05-20 | Payer: MEDICARE

## 2019-05-20 ENCOUNTER — Encounter
Admit: 2019-05-20 | Discharge: 2019-05-20 | Payer: MEDICARE | Attending: Nurse Practitioner | Primary: Nurse Practitioner

## 2019-05-20 DIAGNOSIS — N186 End stage renal disease: Principal | ICD-10-CM

## 2019-05-20 DIAGNOSIS — Z940 Kidney transplant status: Principal | ICD-10-CM

## 2019-05-20 DIAGNOSIS — D899 Disorder involving the immune mechanism, unspecified: Principal | ICD-10-CM

## 2019-05-20 DIAGNOSIS — Z79899 Other long term (current) drug therapy: Principal | ICD-10-CM

## 2019-05-20 DIAGNOSIS — Z6824 Body mass index (BMI) 24.0-24.9, adult: Secondary | ICD-10-CM | POA: Diagnosis not present

## 2019-05-20 DIAGNOSIS — Z20828 Contact with and (suspected) exposure to other viral communicable diseases: Secondary | ICD-10-CM | POA: Diagnosis not present

## 2019-05-20 DIAGNOSIS — D849 Immunodeficiency, unspecified: Secondary | ICD-10-CM | POA: Diagnosis not present

## 2019-05-20 LAB — URINALYSIS
BILIRUBIN UA: NEGATIVE
GLUCOSE UA: NEGATIVE
NITRITE UA: NEGATIVE
PH UA: 6 (ref 5.0–9.0)
PROTEIN UA: 30 — AB
RBC UA: 5 /HPF — ABNORMAL HIGH (ref <=4)
SPECIFIC GRAVITY UA: 1.008 (ref 1.003–1.030)
UROBILINOGEN UA: 0.2
WBC UA: 6 /HPF — ABNORMAL HIGH (ref 0–5)

## 2019-05-20 LAB — BASIC METABOLIC PANEL
ANION GAP: 12 mmol/L (ref 7–15)
BLOOD UREA NITROGEN: 8 mg/dL (ref 7–21)
BUN / CREAT RATIO: 7
CALCIUM: 10.3 mg/dL — ABNORMAL HIGH (ref 8.5–10.2)
CHLORIDE: 109 mmol/L — ABNORMAL HIGH (ref 98–107)
CO2: 20 mmol/L — ABNORMAL LOW (ref 22.0–30.0)
CREATININE: 1.12 mg/dL — ABNORMAL HIGH (ref 0.60–1.00)
EGFR CKD-EPI AA FEMALE: 61 mL/min/{1.73_m2} (ref >=60)
EGFR CKD-EPI NON-AA FEMALE: 53 mL/min/{1.73_m2} — ABNORMAL LOW (ref >=60)
POTASSIUM: 4.3 mmol/L (ref 3.5–5.0)
SODIUM: 141 mmol/L (ref 135–145)

## 2019-05-20 LAB — CBC W/ AUTO DIFF
BASOPHILS ABSOLUTE COUNT: 0.1 10*9/L (ref 0.0–0.1)
BASOPHILS RELATIVE PERCENT: 0.9 %
EOSINOPHILS ABSOLUTE COUNT: 0.3 10*9/L (ref 0.0–0.4)
HEMATOCRIT: 32.3 % — ABNORMAL LOW (ref 36.0–46.0)
HEMOGLOBIN: 9.9 g/dL — ABNORMAL LOW (ref 12.0–16.0)
LARGE UNSTAINED CELLS: 1 % (ref 0–4)
LYMPHOCYTES ABSOLUTE COUNT: 0.1 10*9/L — ABNORMAL LOW (ref 1.5–5.0)
LYMPHOCYTES RELATIVE PERCENT: 2 %
MEAN CORPUSCULAR HEMOGLOBIN CONC: 30.5 g/dL — ABNORMAL LOW (ref 31.0–37.0)
MEAN CORPUSCULAR HEMOGLOBIN: 30.3 pg (ref 26.0–34.0)
MEAN CORPUSCULAR VOLUME: 99.2 fL (ref 80.0–100.0)
MEAN PLATELET VOLUME: 8.4 fL (ref 7.0–10.0)
MONOCYTES ABSOLUTE COUNT: 0.2 10*9/L (ref 0.2–0.8)
MONOCYTES RELATIVE PERCENT: 4.4 %
NEUTROPHILS ABSOLUTE COUNT: 4.8 10*9/L (ref 2.0–7.5)
NEUTROPHILS RELATIVE PERCENT: 86.8 %
RED CELL DISTRIBUTION WIDTH: 18.5 % — ABNORMAL HIGH (ref 12.0–15.0)
WBC ADJUSTED: 5.5 10*9/L (ref 4.5–11.0)

## 2019-05-20 LAB — LIPID PANEL
CHOLESTEROL/HDL RATIO SCREEN: 3 (ref <5.0)
HDL CHOLESTEROL: 53 mg/dL (ref 40–59)
NON-HDL CHOLESTEROL: 106 mg/dL
TRIGLYCERIDES: 181 mg/dL — ABNORMAL HIGH (ref 1–149)
VLDL CHOLESTEROL CAL: 36.2 mg/dL (ref 11–41)

## 2019-05-20 LAB — MAGNESIUM: Magnesium:MCnc:Pt:Ser/Plas:Qn:: 1.1 — ABNORMAL LOW

## 2019-05-20 LAB — HEPATIC FUNCTION PANEL
ALBUMIN: 3.8 g/dL (ref 3.5–5.0)
ALT (SGPT): 6 U/L (ref <35)
BILIRUBIN DIRECT: 0.1 mg/dL (ref 0.00–0.40)
PROTEIN TOTAL: 6.3 g/dL — ABNORMAL LOW (ref 6.5–8.3)

## 2019-05-20 LAB — PROTEIN / CREATININE RATIO, URINE
CREATININE, URINE: 82.9 mg/dL
PROTEIN URINE: 21.4 mg/dL

## 2019-05-20 LAB — GLUCOSE UA: Glucose:MCnc:Pt:Urine:Qn:Test strip: NEGATIVE

## 2019-05-20 LAB — TACROLIMUS, TROUGH: Lab: 4.7 — ABNORMAL LOW

## 2019-05-20 LAB — MONOCYTES ABSOLUTE COUNT: Monocytes:NCnc:Pt:Bld:Qn:Automated count: 0.2

## 2019-05-20 LAB — SMEAR REVIEW

## 2019-05-20 LAB — BILIRUBIN DIRECT: Bilirubin.glucuronidated+Bilirubin.albumin bound:MCnc:Pt:Ser/Plas:Qn:: 0.1

## 2019-05-20 LAB — GLUCOSE RANDOM: Glucose:MCnc:Pt:Ser/Plas:Qn:: 136 — ABNORMAL HIGH

## 2019-05-20 LAB — PROTEIN URINE: Protein:MCnc:Pt:Urine:Qn:: 21.4

## 2019-05-20 LAB — TACROLIMUS BLOOD: Lab: 4.7

## 2019-05-20 LAB — PHOSPHORUS: Phosphate:MCnc:Pt:Ser/Plas:Qn:: 3

## 2019-05-20 LAB — VLDL CHOLESTEROL CAL: Cholesterol.in VLDL:MCnc:Pt:Ser/Plas:Qn:Calculated: 36.2

## 2019-05-20 MED ORDER — TACROLIMUS ER 4 MG TABLET,EXTENDED RELEASE 24 HR
ORAL_TABLET | Freq: Every day | ORAL | 11 refills | 30 days | Status: CP
Start: 2019-05-20 — End: ?
  Filled 2019-05-20: qty 90, 30d supply, fill #0

## 2019-05-20 MED ORDER — METFORMIN ER 500 MG TABLET,EXTENDED RELEASE 24 HR
ORAL_TABLET | Freq: Two times a day (BID) | ORAL | 11 refills | 30 days | Status: CP
Start: 2019-05-20 — End: 2020-05-19
  Filled 2019-05-24: qty 60, 30d supply, fill #0

## 2019-05-20 MED ORDER — MG-PLUS-PROTEIN 133 MG TABLET
ORAL_TABLET | Freq: Two times a day (BID) | ORAL | 11 refills | 50 days | Status: CP
Start: 2019-05-20 — End: ?

## 2019-05-20 MED FILL — ENVARSUS XR 4 MG TABLET,EXTENDED RELEASE: 30 days supply | Qty: 90 | Fill #0 | Status: AC

## 2019-05-20 NOTE — Unmapped (Signed)
Assessment     Michele Cisneros is a 63 y.o. female presenting to [facility] for COVID testing.     Problem List Items Addressed This Visit     None      Visit Diagnoses     Contact with or exposure to viral disease    -  Primary    Relevant Orders    COVID-19 PCR          Plan     If no testing performed, pt counseled on routine care for respiratory illness.  If testing performed, COVID sent.  Patient directed to Home given findings during today's visit.    Subjective     Michele Cisneros is a 63 y.o. female who [facility] with complaints of the following:    Exposure History: In the last 21 days?     Have you traveled outside of [place]? No               Have you been in close contact with someone confirmed by a test to have COVID? (Close contact is within 6 feet for at least 10 minutes) No       Have you worked in a health care facility? No     Lived or worked facility like a nursing home, group home, or assisted living?    No         Are you scheduled to have surgery or a procedure in the next 3 days? No               Are you scheduled to receive cancer chemotherapy within the next 7 days?    No     Have you ever been tested before for COVID-19 with a swab of your nose? Yes: When: n/a, Where: n/a   Are you a healthcare worker being tested so to return to work No         Right now,  do you have any of the following that developed over the past 7 days (as stated by patient on intake form):    Subjective fever (felt feverish) No   Chills (especially repeated shaking chills) No   Severe fatigue (felt very tired) Yes, how many days? n/a   Muscle aches No   Runny nose Yes, how many days? n/a   Sore throat No   Loss of taste or smell No   Cough (new onset or worsening of chronic cough) Yes, how many days? n/a   Shortness of breath Yes, how many days? n/a   Nausea or vomiting Yes, how many days? n/a   Headache Yes, how many days? n/a   Abdominal Pain No Diarrhea (3 or more loose stools in last 24 hours) Yes, how many days? n/a     History/Medical Conditions (as stated by patient on intake form):    Do you have any of the following:   Asthma or emphysema or COPD No   Cystic Fibrosis No   Diabetes Yes   High Blood Pressure  Yes   Cardiovascular Disease No   Chronic Kidney Disease No   Chronic Liver Disease No   Chronic blood disorder like Sickle Cell Disease  No   Weak immune system due to disease or medication Yes   Neurologic condition that limits movement  No   Developmental delay - Moderate to Severe  No   Recent (within past 2 weeks) or current Pregnancy No   Morbid  Obesity (>100 pounds over ideal weight) No   Current Smoker No   Former Smoker Yes       Objective     Given above, testing performed: Yes    Testing Performed:  Test Specimen Type Sent to   COVID-19  NP Swab [place] [facility]       Scribe's Attestation: Laqueta Carina, FNP obtained and performed the history, physical exam and medical decision making elements that were entered into the chart.  Signed by Alleen Borne serving as Scribe, on 05/20/2019 2:55 PM

## 2019-05-20 NOTE — Unmapped (Signed)
AOBP performed right arm, medium cuff.  1st reading - 129/62, p 92  2nd reading - 160/61, p 94  3rd reading - 159/63, p 93  Average - 149/62, p 94

## 2019-05-20 NOTE — Unmapped (Addendum)
-----   Message from Cordelia Poche, AGNP sent at 05/20/2019 11:58 AM EST -----  Regarding: Covid swab  Patient here at [facility].  Needs Covid swab due to symptoms today    Covid test only scheduled at [facility] on 12.11.20 at 12:45pm. Vanice Sarah

## 2019-05-20 NOTE — Unmapped (Signed)
[facility] PHARMACY NOTE  05/20/2019   Michele Cisneros  [id number]    Medication changes today:   1. Start mg plus protein 133 mg BID  2. Increase metformin XR to 500 mg BID     Education/Adherence tools provided today:  1.provided updated medication list  2. provided additional pill box education  3.  provided additional education on immunosuppression and transplant related medications including reviewing indications of medications, dosing and side effects    Follow up items:  1. goal of understanding indications and dosing of immunosuppression medications  2. BP  3. BG  4. Ergocalciferol at next visit  5.     Next visit with pharmacy in 1 month  ____________________________________________________________________    Michele Cisneros is a 63 y.o. female s/p deceased kidney transplant on 07-Apr-2019 (Kidney) 2/2 T2DM.     Other PMH significant for hypertension, T2DM, CAD s/p PCI     Seen by pharmacy today for: medication management and pill box fill and adherence education; last seen by pharmacy 4 weeks ago     Interval history: Hospitalization 11/22-12/1 for nausea, vomiting, and chest pain.  EGD was performed which showed severe esophageal ulcerations.      CC:  Patient complains of  vomiting twice yesterday after running out of lidocaine solution and 2-3 day h/o cold symptoms    There were no vitals filed for this visit.    Allergies   Allergen Reactions   ??? Hydrocodone Hives   ??? Lisinopril      Unknown reaction        All medications reviewed and updated.     Medication list includes revisions made during today???s encounter    Outpatient Encounter Medications as of 05/20/2019   Medication Sig Dispense Refill   ??? acetaminophen (TYLENOL) 500 MG tablet Take 1-2 tablets (500-1,000 mg total) by mouth every six (6) hours as needed for pain or fever (> 38C or 100.32F). 30 tablet 0 ??? albuterol (PROVENTIL HFA;VENTOLIN HFA) 90 mcg/actuation inhaler Inhale 2 puffs every six (6) hours as needed for wheezing.     ??? amLODIPine (NORVASC) 10 MG tablet Take 1 tablet (10 mg total) by mouth daily. 30 tablet 11   ??? aspirin (ECOTRIN) 81 MG tablet Take 81 mg by mouth daily.     ??? atorvastatin (LIPITOR) 40 MG tablet Take 1 tablet (40 mg total) by mouth daily. 30 tablet 11   ??? blood sugar diagnostic (CONTOUR TEST STRIPS) Strp by Other route two (2) times a day. TEST BLOOD SUGARS 2 TIMES DAILY 60 strip 5   ??? blood-glucose meter (ONETOUCH ULTRA SYSTEM KIT) kit Please dispense for patient to use at home to check blood sugars.     ??? carvediloL (COREG) 12.5 MG tablet Take 3 tablets (37.5 mg total) by mouth Two (2) times a day. 180 tablet 11   ??? cloNIDine HCL (CATAPRES) 0.1 MG tablet Take 2 tablets (0.2 mg total) by mouth every eight (8) hours. 180 tablet 11   ??? donepeziL (ARICEPT) 5 MG tablet Take 5 mg by mouth nightly.     ??? isosorbide mononitrate (IMDUR) 60 MG 24 hr tablet Take 60 mg by mouth daily.     ??? LANCETS,THIN MISC Using at home to test blood sugars twice daily.     ??? lidocaine 2% viscous (XYLOCAINE) 2 % Soln Use 15 mL by Mouth as directed Every four (4) hours. 2000 mL 5   ??? magnesium oxide-Mg AA chelate (MAGNESIUM, AMINO ACID  CHELATE,) 133 mg Tab Take 1 tablet by mouth Two (2) times a day. HOLD until directed to start by your coordinator. 100 tablet 11   ??? metFORMIN (GLUCOPHAGE XR) 500 MG 24 hr tablet Take 1 tablet (500 mg total) by mouth daily. 30 tablet 11   ??? metoclopramide (REGLAN) 5 MG tablet Take 1 tablet (5 mg total) by mouth Three (3) times a day before meals. 90 tablet 0   ??? mirtazapine (REMERON) 15 MG tablet Take 1 tablet (15 mg total) by mouth nightly. 90 tablet 3   ??? mycophenolate (MYFORTIC) 360 MG TbEC Take 1 tablet (360 mg total) by mouth Three (3) times a day. 90 tablet 11   ??? nitroglycerin (NITROSTAT) 0.4 MG SL tablet Place 0.4 mg under the tongue daily as needed. ??? oxyCODONE (ROXICODONE) 5 MG immediate release tablet Take 1-2 tablets (5-10 mg total) by mouth every six (6) hours as needed for pain. 40 tablet 0   ??? pantoprazole (PROTONIX) 40 MG tablet Take 1 tablet (40 mg total) by mouth Two (2) times a day. 60 tablet 11   ??? [EXPIRED] polyethylene glycol (GLYCOLAX) 17 gram/dose powder Mix 1 capful (17 g) in 4 to 8 ounces water, juice, soda, coffee, or tea and drink daily as needed. 510 g 0   ??? polyethylene glycol (GLYCOLAX) 17 gram/dose powder Mix 1 capful (17 g) in 4 to 8 ounces water, juice, soda, coffee, or tea and drink daily as needed. 510 g 0   ??? pregabalin (LYRICA) 75 MG capsule Take 1 capsule (75 mg total) by mouth Two (2) times a day. 60 capsule 3   ??? promethazine (PHENERGAN) 25 MG tablet Take 1 tablet (25 mg total) by mouth every six (6) hours as needed for up to 28 doses. 30 each 3   ??? senna (SENOKOT) 8.6 mg tablet Take 1 tablet by mouth nightly as needed for constipation. 30 tablet 11   ??? sucralfate (CARAFATE) 100 mg/mL suspension Take 10 mL (1 g total) by mouth Every six (6) hours. 1200 mL 0   ??? sulfamethoxazole-trimethoprim (BACTRIM) 400-80 mg per tablet Take 1 tablet (80 mg of trimethoprim total) by mouth Every Monday, Wednesday, and Friday. 12 tablet 5   ??? tacrolimus (ENVARSUS XR) 1 mg Tb24 extended release tablet Take 1 tablet (1 mg) by mouth daily with 3 (4mg ) tablets for total 13mg  daily dose. 30 tablet 11   ??? tacrolimus (ENVARSUS XR) 4 mg Tb24 extended release tablet Take 3 tablets (12 mg) by mouth daily with 1 (1 mg) tablets for total 13mg  daily dose. 90 tablet 11   ??? valGANciclovir (VALCYTE) 450 mg tablet Take 1 tablet (450 mg total) by mouth daily. 30 tablet 2     No facility-administered encounter medications on file as of 05/20/2019.        Induction agent : alemtuzumab    CURRENT IMMUNOSUPPRESSION: Envarsus 13 mg PO qd (although pill box only had 9 mg in Friday, missing, Saturday)  prograf/Envarsus/cyclosporine goal: 8-10   [id number]  mg PO bid steroid free     Patient complains of minor tremor    IMMUNOSUPPRESSION DRUG LEVELS:  Lab Results   Component Value Date    Tacrolimus, Trough 5.7 05/10/2019    Tacrolimus, Trough 6.5 05/09/2019    Tacrolimus, Trough 4.9 (L) 05/08/2019    Tacrolimus Lvl 8.8 05/16/2019     No results found for: CYCLO  No results found for: EVEROLIMUS  No results found for: SIROLIMUS    Envarsus level  is accurate 24 hour trough    Graft function: stable  DSA: ntd  Biopsies to date: zero hour biopsy - mild acute tubular injury  UPC: 0.258 (05/20/19)  WBC/ANC:  wnl     Plan: Will maintain current immunosuppression as unclear if she was taking the correct dose. Continue to monitor.    OI Prophylaxis:   CMV Status: D-/ R+, moderate risk. CMV prophylaxis: valganciclovir 450 mg daily x 3 months per protocol.  Estimated Creatinine Clearance: 54.3 mL/min (A) (based on SCr of 1.16 mg/dL (H)).  No results found for: CMVCP  PCP Prophylaxis: bactrim SS 1 tab MWF x 6 months.  Thrush: completed in hospital  Patient is  tolerating infectious prophylaxis well    Plan: Continue to monitor.    CV Prophylaxis: asa 81 mg   The ASCVD Risk score Denman George DC Montez Hageman, et al., 2013) failed to calculate.  Statin therapy: Indicated given DM+ CAD history; atorvastatin 40 mg daily  Plan: Continue to monitor     BP: Goal < 140/90. Clinic vitals reported above  Home BP ranges: home BP cuff likely inaccurate (180s systolic on home cuff but PT systolics are 130)  Current meds include: amlodipine 10 mg, carvedilol 37.5 mg BID, clonidine 0.2 mg TID  Plan: within goal.  Continue to monitor    Anemia of CKD:  H/H:   Lab Results   Component Value Date    HGB 8.8 (L) 05/16/2019     Lab Results   Component Value Date    HCT 27.5 (L) 05/16/2019     Iron panel:  No results found for: IRON, TIBC, FERRITIN  No results found for: LABIRON    Prior ESA use: none post transplant    Plan: stable. Continue to monitor.     DM:   Lab Results   Component Value Date    A1C 4.1 (L) 03/28/2019 . Goal A1c < 7  History of Dm? Yes: T2DM  Established with endocrinologist/PCP for BG managment? Yes: PCP  Currently on: metformin XR 500 mg daily  BG log: FBG 130-140s, PM BG 180-260s  Diet:did not address  Exercise:not yet  Hypoglycemia: no  Plan: Increase metformin XR to 500 mg BID.  Eventually may benefit from GLP 1    Electrolytes: mag 1.1  Meds currently on: none  Plan: Start mg plus protein 133 mg BID.  Continue to monitor     GI/BM: pt reports that she vomited twice yesterday and attributes to running out of lidocaine, has intermittent nausea, normal BM, denies pain with swallowing  Meds currently on: pantoprazole 40 mg BID, Reglan 5 mg TID AC, lidocaine solution 15 mg q4h (out since yesterday)  senna PRN, promethazine 25 mg q6h PRN (in pill box qAM), Carafate 10 mg QID  Plan: COVID-19 test given recent onset of vomiting + cold symptoms.  She'll receive lidocaine in the mail today. Continue to monitor    Pain: pt reports moderate pain, chronic back pain still present but neuropathy improving  Meds currently on: APAP PRN,  pregabalin 75 mg BID  Plan:Continue to monitor    Bone health:   Vitamin D Level: 8.4 on 04/11/19. Goal > 30.   Last DEXA results:  none available  Current meds include: none  Plan: Vitamin D level  out of goal.  Given elevated calcium will not add at this time.  Continue to monitor.     Women's/Men's Health:  Michele Cisneros is a [name] perimenopausal. Patient reports no  men's/women's health issues  Plan: Continue to monitor    Appetite stimulant:  Meds currently on: mirtazapine 15 mg HS  Plan: continue to monitor    Pharmacy preference:  [facility]    Adherence: Patient has poor understanding of medications; was not able to independently identify names/doses of immunosuppressants and OI meds.  Patient  does fill their own pill box on a regular basis at home.  Patient brought medication card:yes  Pill [id number] - Envarsus 9 mg on Friday, missing on Saturday Plan: Encouraged patient to familiarize herself with meds; provided extensive adherence counseling/intervention    Spent approximately 40 minutes on educating this patient and greater than 50% was spent in direct face to face counseling regarding post transplant medication education. Questions and concerns were address to patient's satisfaction.    Patient was reviewed with [name], AGNP who was agreement with the stated plan:     During this visit, the following was completed:   BG log data assessment  BP log data assessment  Labs ordered and evaluated  complex treatment plan >1 DS   Patient education was completed for 11-24 minutes     All questions/concerns were addressed to the patient's satisfaction.  __________________________________________  Cleone Slim, PHARMD, CPP  [facility] [employer] TRANSPL[facility] CLINICAL PHARMACIST PRACTITIONER  PAGER [phone]

## 2019-05-21 LAB — HEMOGLOBIN A1C
ESTIMATED AVERAGE GLUCOSE: 94 mg/dL
Hemoglobin A1c/Hemoglobin.total:MFr:Pt:Bld:Qn:: 4.9

## 2019-05-23 DIAGNOSIS — Z940 Kidney transplant status: Principal | ICD-10-CM

## 2019-05-23 DIAGNOSIS — Z4822 Encounter for aftercare following kidney transplant: Secondary | ICD-10-CM | POA: Diagnosis not present

## 2019-05-23 DIAGNOSIS — E1122 Type 2 diabetes mellitus with diabetic chronic kidney disease: Secondary | ICD-10-CM | POA: Diagnosis not present

## 2019-05-23 DIAGNOSIS — E7849 Other hyperlipidemia: Secondary | ICD-10-CM | POA: Diagnosis not present

## 2019-05-23 DIAGNOSIS — N186 End stage renal disease: Secondary | ICD-10-CM | POA: Diagnosis not present

## 2019-05-23 DIAGNOSIS — Z992 Dependence on renal dialysis: Secondary | ICD-10-CM | POA: Diagnosis not present

## 2019-05-23 DIAGNOSIS — I120 Hypertensive chronic kidney disease with stage 5 chronic kidney disease or end stage renal disease: Secondary | ICD-10-CM | POA: Diagnosis not present

## 2019-05-24 DIAGNOSIS — Z79899 Other long term (current) drug therapy: Principal | ICD-10-CM

## 2019-05-24 DIAGNOSIS — Z940 Kidney transplant status: Principal | ICD-10-CM

## 2019-05-24 LAB — URINALYSIS
BILIRUBIN UA: NEGATIVE
BLOOD UA: NEGATIVE
GLUCOSE UA: NEGATIVE
KETONES UA: NEGATIVE
NITRITE UA: NEGATIVE
PH UA: 5.5 (ref 5.0–7.5)
SPECIFIC GRAVITY UA: 1.012 (ref 1.005–1.030)
UROBILINOGEN UA: 0.2 mg/dL (ref 0.2–1.0)

## 2019-05-24 LAB — MICROSCOPIC EXAMINATION

## 2019-05-24 LAB — BK VIRUS QUANTITATIVE PCR, BLOOD

## 2019-05-24 LAB — CREATININE URINE: Creatinine:MCnc:Pt:Urine:Qn:: 98.3

## 2019-05-24 LAB — CASTS: Casts:PrThr:Pt:Urine sed:Ord:Microscopy.light: NONE SEEN

## 2019-05-24 LAB — PROTEIN UA

## 2019-05-24 LAB — PROTEIN / CREATININE RATIO, URINE
CREATININE URINE: 98.3 mg/dL
PROTEIN URINE: 24.9 mg/dL

## 2019-05-24 LAB — BK BLOOD QUANT: Lab: 0

## 2019-05-24 MED FILL — CARVEDILOL 12.5 MG TABLET: ORAL | 30 days supply | Qty: 180 | Fill #1

## 2019-05-24 MED FILL — PANTOPRAZOLE 40 MG TABLET,DELAYED RELEASE: 30 days supply | Qty: 60 | Fill #1 | Status: AC

## 2019-05-24 MED FILL — ENVARSUS XR 1 MG TABLET,EXTENDED RELEASE: 30 days supply | Qty: 30 | Fill #0 | Status: AC

## 2019-05-24 MED FILL — CARVEDILOL 12.5 MG TABLET: 30 days supply | Qty: 180 | Fill #1 | Status: AC

## 2019-05-24 MED FILL — ENVARSUS XR 1 MG TABLET,EXTENDED RELEASE: ORAL | 30 days supply | Qty: 30 | Fill #0

## 2019-05-24 MED FILL — METFORMIN ER 500 MG TABLET,EXTENDED RELEASE 24 HR: 30 days supply | Qty: 60 | Fill #0 | Status: AC

## 2019-05-25 LAB — CMV QUANT LOG10: Lab: 0

## 2019-05-25 LAB — CMV DNA, QUANTITATIVE, PCR: CMV VIRAL LD: NOT DETECTED

## 2019-05-25 LAB — VITAMIN D, TOTAL (25OH): Lab: 6.4 — ABNORMAL LOW

## 2019-05-25 NOTE — Unmapped (Signed)
Addendum  created 05/25/19 0809 by Ignacia Palma, MD    Delete clinical note

## 2019-05-26 DIAGNOSIS — Z79899 Other long term (current) drug therapy: Principal | ICD-10-CM

## 2019-05-26 DIAGNOSIS — Z940 Kidney transplant status: Principal | ICD-10-CM

## 2019-05-26 DIAGNOSIS — N186 End stage renal disease: Secondary | ICD-10-CM | POA: Diagnosis not present

## 2019-05-26 DIAGNOSIS — Z4822 Encounter for aftercare following kidney transplant: Secondary | ICD-10-CM | POA: Diagnosis not present

## 2019-05-26 DIAGNOSIS — E7849 Other hyperlipidemia: Secondary | ICD-10-CM | POA: Diagnosis not present

## 2019-05-26 DIAGNOSIS — I120 Hypertensive chronic kidney disease with stage 5 chronic kidney disease or end stage renal disease: Secondary | ICD-10-CM | POA: Diagnosis not present

## 2019-05-26 DIAGNOSIS — E1122 Type 2 diabetes mellitus with diabetic chronic kidney disease: Secondary | ICD-10-CM | POA: Diagnosis not present

## 2019-05-26 DIAGNOSIS — Z992 Dependence on renal dialysis: Secondary | ICD-10-CM | POA: Diagnosis not present

## 2019-05-26 LAB — FSAB CLASS 1 ANTIBODY SPECIFICITY: HLA CLASS 1 ANTIBODY RESULT: POSITIVE

## 2019-05-26 LAB — HLA CLASS 1 ANTIBODY RESULT: Lab: POSITIVE

## 2019-05-26 LAB — FSAB CLASS 2 ANTIBODY SPECIFICITY: HLA CL2 AB RESULT: NEGATIVE

## 2019-05-26 LAB — HLA CL2 AB COMMENT: Lab: 0

## 2019-05-27 LAB — RENAL FUNCTION PANEL
BLOOD UREA NITROGEN: 16 mg/dL (ref 8–27)
CALCIUM: 9.5 mg/dL (ref 8.7–10.3)
CHLORIDE: 109 mmol/L — ABNORMAL HIGH (ref 96–106)
CREATININE: 1.32 mg/dL — ABNORMAL HIGH (ref 0.57–1.00)
GFR MDRD AF AMER: 50 mL/min/{1.73_m2} — ABNORMAL LOW
GFR MDRD NON AF AMER: 44 mL/min/{1.73_m2} — ABNORMAL LOW
GLUCOSE: 126 mg/dL — ABNORMAL HIGH (ref 65–99)
PHOSPHORUS, SERUM: 3.7 mg/dL (ref 3.0–4.3)
POTASSIUM: 3.8 mmol/L (ref 3.5–5.2)
SODIUM: 142 mmol/L (ref 134–144)

## 2019-05-27 LAB — CBC W/ DIFFERENTIAL
BANDED NEUTROPHILS ABSOLUTE COUNT: 0 10*3/uL (ref 0.0–0.1)
BASOPHILS ABSOLUTE COUNT: 0 10*3/uL (ref 0.0–0.2)
BASOPHILS RELATIVE PERCENT: 1 %
EOSINOPHILS ABSOLUTE COUNT: 0.1 10*3/uL (ref 0.0–0.4)
EOSINOPHILS RELATIVE PERCENT: 4 %
HEMOGLOBIN: 8.7 g/dL — ABNORMAL LOW (ref 11.1–15.9)
IMMATURE GRANULOCYTES: 1 %
LYMPHOCYTES ABSOLUTE COUNT: 0.1 10*3/uL — ABNORMAL LOW (ref 0.7–3.1)
LYMPHOCYTES RELATIVE PERCENT: 2 %
MEAN CORPUSCULAR HEMOGLOBIN CONC: 33.6 g/dL (ref 31.5–35.7)
MEAN CORPUSCULAR HEMOGLOBIN: 31.6 pg (ref 26.6–33.0)
MEAN CORPUSCULAR VOLUME: 94 fL (ref 79–97)
MONOCYTES ABSOLUTE COUNT: 0.2 10*3/uL (ref 0.1–0.9)
MONOCYTES RELATIVE PERCENT: 5 %
NEUTROPHILS RELATIVE PERCENT: 87 %
PLATELET COUNT: 262 10*3/uL (ref 150–450)
RED BLOOD CELL COUNT: 2.75 x10E6/uL — ABNORMAL LOW (ref 3.77–5.28)
RED CELL DISTRIBUTION WIDTH: 15.9 % — ABNORMAL HIGH (ref 11.7–15.4)
WHITE BLOOD CELL COUNT: 3 10*3/uL — ABNORMAL LOW (ref 3.4–10.8)

## 2019-05-27 LAB — CREATININE: Creatinine:MCnc:Pt:Ser/Plas:Qn:: 1.32 — ABNORMAL HIGH

## 2019-05-27 LAB — VITAMIN D 1,25-DIHYDROXY: 1,25-Dihydroxyvitamin D:MCnc:Pt:Ser/Plas:Qn:: 34

## 2019-05-27 LAB — RED CELL DISTRIBUTION WIDTH: Erythrocyte distribution width:Ratio:Pt:RBC:Qn:Automated count: 15.9 — ABNORMAL HIGH

## 2019-05-27 LAB — MAGNESIUM: Magnesium:MCnc:Pt:Ser/Plas:Qn:: 1.1 — ABNORMAL LOW

## 2019-05-27 MED ORDER — ERGOCALCIFEROL (VITAMIN D2) 1,250 MCG (50,000 UNIT) CAPSULE
ORAL_CAPSULE | ORAL | 2 refills | 28 days | Status: CP
Start: 2019-05-27 — End: 2020-05-26
  Filled 2019-05-30: qty 4, 28d supply, fill #0

## 2019-05-27 NOTE — Unmapped (Signed)
Per [facility] NP start taking ergocalciferol 50000 units weekly for 8 weeks    Called and left VM for patient

## 2019-05-28 LAB — TACROLIMUS BLOOD: Tacrolimus:MCnc:Pt:Bld:Qn:LC/MS/MS: 7.6

## 2019-05-30 DIAGNOSIS — Z940 Kidney transplant status: Principal | ICD-10-CM

## 2019-05-30 DIAGNOSIS — Z79899 Other long term (current) drug therapy: Principal | ICD-10-CM

## 2019-05-30 DIAGNOSIS — Z992 Dependence on renal dialysis: Secondary | ICD-10-CM | POA: Diagnosis not present

## 2019-05-30 DIAGNOSIS — I120 Hypertensive chronic kidney disease with stage 5 chronic kidney disease or end stage renal disease: Secondary | ICD-10-CM | POA: Diagnosis not present

## 2019-05-30 DIAGNOSIS — Z4822 Encounter for aftercare following kidney transplant: Secondary | ICD-10-CM | POA: Diagnosis not present

## 2019-05-30 DIAGNOSIS — E1122 Type 2 diabetes mellitus with diabetic chronic kidney disease: Secondary | ICD-10-CM | POA: Diagnosis not present

## 2019-05-30 DIAGNOSIS — N186 End stage renal disease: Secondary | ICD-10-CM | POA: Diagnosis not present

## 2019-05-30 DIAGNOSIS — E7849 Other hyperlipidemia: Secondary | ICD-10-CM | POA: Diagnosis not present

## 2019-05-30 MED FILL — ERGOCALCIFEROL (VITAMIN D2) 1,250 MCG (50,000 UNIT) CAPSULE: 28 days supply | Qty: 4 | Fill #0 | Status: AC

## 2019-05-30 NOTE — Unmapped (Signed)
[facility] Refill Coordination Note    Specialty Medication(s) to be Shipped:   Transplant:  mycophenolic acid 180mg  and valgancyclovir 450mg   Patient declined Envarsus today.     Other medication(s) to be shipped: [id number]     Michele Cisneros, DOB: May 17, 1956  Phone: [phone] (home)       All above HIPAA information was verified with patient.     Was a Nurse, learning disability used for this call? No    Completed refill call assessment today to schedule patient's medication shipment from the [facility] [phone]).       Specialty medication(s) and dose(s) confirmed: Regimen is correct and unchanged.   Changes to medications: Michele Cisneros reports no changes at this time.  Changes to insurance: No  Questions for the pharmacist: No    Confirmed patient received Welcome Packet with first shipment. The patient will receive a drug information handout for each medication shipped and additional FDA Medication Guides as required.       DISEASE/MEDICATION-SPECIFIC INFORMATION        N/A    SPECIALTY MEDICATION ADHERENCE     Medication Adherence    Patient reported X missed doses in the last month: 0  Specialty Medication: Mycophenolate 360mg   Patient is on additional specialty medications: No  Informant: patient                Mycophenolate 360 mg: 7 days of medicine on hand   Valganciclovir 450 mg: 7 days of medicine on hand *    SHIPPING     Shipping address confirmed in Epic.     Delivery Scheduled: Yes, Expected medication delivery date: 06/06/2019.     Medication will be delivered via Same Day Courier to the prescription address in Epic WAM.    Tera Helper   [facility] Specialty Pharmacist

## 2019-05-31 DIAGNOSIS — Z940 Kidney transplant status: Principal | ICD-10-CM

## 2019-05-31 DIAGNOSIS — Z79899 Other long term (current) drug therapy: Principal | ICD-10-CM

## 2019-05-31 LAB — RENAL FUNCTION PANEL
ALBUMIN: 3.7 g/dL — ABNORMAL LOW (ref 3.8–4.8)
BLOOD UREA NITROGEN: 9 mg/dL (ref 8–27)
BUN / CREAT RATIO: 8 — ABNORMAL LOW (ref 12–28)
CALCIUM: 9.5 mg/dL (ref 8.7–10.3)
CHLORIDE: 105 mmol/L (ref 96–106)
CREATININE: 1.06 mg/dL — ABNORMAL HIGH (ref 0.57–1.00)
GFR MDRD NON AF AMER: 57 mL/min/{1.73_m2} — ABNORMAL LOW
GLUCOSE: 134 mg/dL — ABNORMAL HIGH (ref 65–99)
PHOSPHORUS, SERUM: 2.9 mg/dL — ABNORMAL LOW (ref 3.0–4.3)
POTASSIUM: 3.5 mmol/L (ref 3.5–5.2)

## 2019-05-31 LAB — ANTI-DONOR HLA-B #1 MFI: Lab: 0

## 2019-05-31 LAB — HLA DS POST TRANSPLANT
ANTI-DONOR DRW #2 MFI: 16 MFI
ANTI-DONOR HLA-A #1 MFI: 13 MFI
ANTI-DONOR HLA-A #2 MFI: 14 MFI
ANTI-DONOR HLA-B #1 MFI: 0 MFI
ANTI-DONOR HLA-B #2 MFI: 36 MFI
ANTI-DONOR HLA-C #1 MFI: 0 MFI
ANTI-DONOR HLA-C #2 MFI: 0 MFI
ANTI-DONOR HLA-DP AG #1 MFI: 16 MFI
ANTI-DONOR HLA-DQB #1 MFI: 0 MFI
ANTI-DONOR HLA-DQB #2 MFI: 48 MFI
ANTI-DONOR HLA-DR #1 MFI: 21 MFI
ANTI-DONOR HLA-DR #2 MFI: 0 MFI

## 2019-05-31 LAB — CBC W/ DIFFERENTIAL
BANDED NEUTROPHILS ABSOLUTE COUNT: 0 10*3/uL (ref 0.0–0.1)
BASOPHILS ABSOLUTE COUNT: 0.1 10*3/uL (ref 0.0–0.2)
BASOPHILS RELATIVE PERCENT: 3 %
EOSINOPHILS ABSOLUTE COUNT: 0.1 10*3/uL (ref 0.0–0.4)
EOSINOPHILS RELATIVE PERCENT: 4 %
HEMATOCRIT: 26.1 % — ABNORMAL LOW (ref 34.0–46.6)
HEMOGLOBIN: 8.5 g/dL — ABNORMAL LOW (ref 11.1–15.9)
IMMATURE GRANULOCYTES: 1 %
LYMPHOCYTES ABSOLUTE COUNT: 0.1 10*3/uL — ABNORMAL LOW (ref 0.7–3.1)
LYMPHOCYTES RELATIVE PERCENT: 5 %
MEAN CORPUSCULAR HEMOGLOBIN: 30.4 pg (ref 26.6–33.0)
MEAN CORPUSCULAR VOLUME: 93 fL (ref 79–97)
MONOCYTES ABSOLUTE COUNT: 0.3 10*3/uL (ref 0.1–0.9)
MONOCYTES RELATIVE PERCENT: 15 %
NEUTROPHILS ABSOLUTE COUNT: 1.3 10*3/uL — ABNORMAL LOW (ref 1.4–7.0)
NEUTROPHILS RELATIVE PERCENT: 72 %
PLATELET COUNT: 218 10*3/uL (ref 150–450)
RED BLOOD CELL COUNT: 2.8 x10E6/uL — ABNORMAL LOW (ref 3.77–5.28)
RED CELL DISTRIBUTION WIDTH: 15.7 % — ABNORMAL HIGH (ref 11.7–15.4)

## 2019-05-31 LAB — LYMPHOCYTES ABSOLUTE COUNT: Lymphocytes:NCnc:Pt:Bld:Qn:Automated count: 0.1 — ABNORMAL LOW

## 2019-05-31 LAB — GFR MDRD AF AMER: Glomerular filtration rate/1.73 sq M.predicted.black:ArVRat:Pt:Ser/Plas/Bld:Qn:Creatinine-based formula (CKD-EPI): 65

## 2019-05-31 LAB — MAGNESIUM: Magnesium:MCnc:Pt:Ser/Plas:Qn:: 1.1 — ABNORMAL LOW

## 2019-05-31 MED ORDER — MYCOPHENOLATE SODIUM 360 MG TABLET,DELAYED RELEASE
ORAL_TABLET | Freq: Two times a day (BID) | ORAL | 11 refills | 30 days | Status: CP
Start: 2019-05-31 — End: ?
  Filled 2019-06-06: qty 60, 30d supply, fill #0

## 2019-05-31 NOTE — Unmapped (Signed)
Per Dr Gwynneth Munson, decrease Myfortic to 360mg  BID and add CMV to labs    Called and updated patient who verbalized understanding.  Will have labs again Monday

## 2019-05-31 NOTE — Unmapped (Signed)
Mycophenolate dose decrease  Scheduled to fill 06/06/19 as a same day courier  Copay = $0

## 2019-06-01 LAB — TACROLIMUS BLOOD: Tacrolimus:MCnc:Pt:Bld:Qn:LC/MS/MS: 10.3

## 2019-06-02 DIAGNOSIS — Z940 Kidney transplant status: Principal | ICD-10-CM

## 2019-06-02 DIAGNOSIS — Z79899 Other long term (current) drug therapy: Principal | ICD-10-CM

## 2019-06-06 DIAGNOSIS — Z940 Kidney transplant status: Principal | ICD-10-CM

## 2019-06-06 DIAGNOSIS — Z79899 Other long term (current) drug therapy: Principal | ICD-10-CM

## 2019-06-06 DIAGNOSIS — E7849 Other hyperlipidemia: Secondary | ICD-10-CM | POA: Diagnosis not present

## 2019-06-06 DIAGNOSIS — N186 End stage renal disease: Secondary | ICD-10-CM | POA: Diagnosis not present

## 2019-06-06 DIAGNOSIS — I120 Hypertensive chronic kidney disease with stage 5 chronic kidney disease or end stage renal disease: Secondary | ICD-10-CM | POA: Diagnosis not present

## 2019-06-06 DIAGNOSIS — Z992 Dependence on renal dialysis: Secondary | ICD-10-CM | POA: Diagnosis not present

## 2019-06-06 DIAGNOSIS — Z4822 Encounter for aftercare following kidney transplant: Secondary | ICD-10-CM | POA: Diagnosis not present

## 2019-06-06 DIAGNOSIS — E1122 Type 2 diabetes mellitus with diabetic chronic kidney disease: Secondary | ICD-10-CM | POA: Diagnosis not present

## 2019-06-06 MED FILL — SULFAMETHOXAZOLE 400 MG-TRIMETHOPRIM 80 MG TABLET: ORAL | 28 days supply | Qty: 12 | Fill #1

## 2019-06-06 MED FILL — VALGANCICLOVIR 450 MG TABLET: ORAL | 30 days supply | Qty: 30 | Fill #1

## 2019-06-06 MED FILL — SULFAMETHOXAZOLE 400 MG-TRIMETHOPRIM 80 MG TABLET: 28 days supply | Qty: 12 | Fill #1 | Status: AC

## 2019-06-06 MED FILL — VALGANCICLOVIR 450 MG TABLET: 30 days supply | Qty: 30 | Fill #1 | Status: AC

## 2019-06-06 MED FILL — MYCOPHENOLATE SODIUM 360 MG TABLET,DELAYED RELEASE: 30 days supply | Qty: 60 | Fill #0 | Status: AC

## 2019-06-07 DIAGNOSIS — Z940 Kidney transplant status: Principal | ICD-10-CM

## 2019-06-07 DIAGNOSIS — Z79899 Other long term (current) drug therapy: Principal | ICD-10-CM

## 2019-06-07 DIAGNOSIS — I120 Hypertensive chronic kidney disease with stage 5 chronic kidney disease or end stage renal disease: Secondary | ICD-10-CM | POA: Diagnosis not present

## 2019-06-07 DIAGNOSIS — E1122 Type 2 diabetes mellitus with diabetic chronic kidney disease: Secondary | ICD-10-CM | POA: Diagnosis not present

## 2019-06-07 DIAGNOSIS — Z992 Dependence on renal dialysis: Secondary | ICD-10-CM | POA: Diagnosis not present

## 2019-06-07 DIAGNOSIS — E7849 Other hyperlipidemia: Secondary | ICD-10-CM | POA: Diagnosis not present

## 2019-06-07 DIAGNOSIS — N186 End stage renal disease: Secondary | ICD-10-CM | POA: Diagnosis not present

## 2019-06-07 DIAGNOSIS — Z4822 Encounter for aftercare following kidney transplant: Secondary | ICD-10-CM | POA: Diagnosis not present

## 2019-06-07 LAB — RENAL FUNCTION PANEL
ALBUMIN: 3.9 g/dL (ref 3.8–4.8)
BLOOD UREA NITROGEN: 12 mg/dL (ref 8–27)
CALCIUM: 10.3 mg/dL (ref 8.7–10.3)
CHLORIDE: 105 mmol/L (ref 96–106)
CREATININE: 1.02 mg/dL — ABNORMAL HIGH (ref 0.57–1.00)
GFR MDRD AF AMER: 69 mL/min/{1.73_m2}
GFR MDRD NON AF AMER: 60 mL/min/{1.73_m2}
GLUCOSE: 153 mg/dL — ABNORMAL HIGH (ref 65–99)
PHOSPHORUS, SERUM: 3.1 mg/dL (ref 3.0–4.3)
POTASSIUM: 3.5 mmol/L (ref 3.5–5.2)
SODIUM: 141 mmol/L (ref 134–144)

## 2019-06-07 LAB — CBC W/ DIFFERENTIAL
BANDED NEUTROPHILS ABSOLUTE COUNT: 0 10*3/uL (ref 0.0–0.1)
BASOPHILS ABSOLUTE COUNT: 0.1 10*3/uL (ref 0.0–0.2)
EOSINOPHILS ABSOLUTE COUNT: 0.1 10*3/uL (ref 0.0–0.4)
EOSINOPHILS RELATIVE PERCENT: 4 %
HEMATOCRIT: 28 % — ABNORMAL LOW (ref 34.0–46.6)
IMMATURE GRANULOCYTES: 1 %
LYMPHOCYTES ABSOLUTE COUNT: 0.1 10*3/uL — ABNORMAL LOW (ref 0.7–3.1)
MEAN CORPUSCULAR HEMOGLOBIN CONC: 32.5 g/dL (ref 31.5–35.7)
MEAN CORPUSCULAR HEMOGLOBIN: 30.2 pg (ref 26.6–33.0)
MEAN CORPUSCULAR VOLUME: 93 fL (ref 79–97)
MONOCYTES ABSOLUTE COUNT: 0.2 10*3/uL (ref 0.1–0.9)
MONOCYTES RELATIVE PERCENT: 8 %
NEUTROPHILS ABSOLUTE COUNT: 2.5 10*3/uL (ref 1.4–7.0)
NEUTROPHILS RELATIVE PERCENT: 82 %
PLATELET COUNT: 238 10*3/uL (ref 150–450)
RED BLOOD CELL COUNT: 3.01 x10E6/uL — ABNORMAL LOW (ref 3.77–5.28)
RED CELL DISTRIBUTION WIDTH: 15.8 % — ABNORMAL HIGH (ref 11.7–15.4)
WHITE BLOOD CELL COUNT: 3 10*3/uL — ABNORMAL LOW (ref 3.4–10.8)

## 2019-06-07 LAB — LYMPHOCYTES ABSOLUTE COUNT: Lymphocytes:NCnc:Pt:Bld:Qn:Automated count: 0.1 — ABNORMAL LOW

## 2019-06-07 LAB — MAGNESIUM: Magnesium:MCnc:Pt:Ser/Plas:Qn:: 1.2 — ABNORMAL LOW

## 2019-06-07 LAB — GFR MDRD NON AF AMER
Glomerular filtration rate/1.73 sq M.predicted.non black:ArVRat:Pt:Ser/Plas/Bld:Qn:Creatinine-based formula (CKD-EPI): 60

## 2019-06-07 NOTE — Unmapped (Signed)
Left a voice mail message for patient to contact me about her [employer]. Have not made contact from [facility] letter mailed on April 26, 2020.  T[facility] missed patient's return phone call from my prior voice mail message.

## 2019-06-08 LAB — TACROLIMUS BLOOD: Tacrolimus:MCnc:Pt:Bld:Qn:LC/MS/MS: 5.3

## 2019-06-09 MED ORDER — SUCRALFATE 100 MG/ML ORAL SUSPENSION
Freq: Four times a day (QID) | ORAL | 0 refills | 30 days | Status: CP
Start: 2019-06-09 — End: 2019-07-09
  Filled 2019-06-14: qty 1200, 30d supply, fill #0

## 2019-06-09 NOTE — Unmapped (Signed)
Patient will need these medications refilled by a PCP or other primary provider as I was her provider in the hospital.

## 2019-06-09 NOTE — Unmapped (Signed)
Spoke to patient who needed a refill on her carafate and questions about her apts next week.    Clarified that she was taking the correct dose of envarsus    Reports she is building her strength and eating more. Concerned that she has lost so much weight.  Encouraged well balanced meals and snacks.    Denies any other needs

## 2019-06-13 ENCOUNTER — Encounter: Admit: 2019-06-13 | Discharge: 2019-06-14 | Payer: MEDICARE | Attending: Urology | Primary: Urology

## 2019-06-13 DIAGNOSIS — N186 End stage renal disease: Principal | ICD-10-CM

## 2019-06-13 NOTE — Unmapped (Signed)
ASSESSMENT:  s/p DDRT 03/30/19  Cysto/stent removal today    PLAN:  Fu with transplant as scheduled  ----    HISTORY OF PRESENT ILLNESS:   A 64 y.o.-year-old female seen today in consultation at the request of McLean-Scocuzza, French Ana * for indwelling ureteral stent.    PMHx of CAD, HTN, COPD, and ESRD secondary to T2DM s/p DDKT on 03/30/19??  Readmitted 05/01/19 - 05/10/19 for nausea, vomiting, chest pain. Diagnosed with stomach ulcer/H Pylori.  She has done well since discharge.    I review history elements and review of systems on new patient intake form.    PAST MEDICAL HISTORY:   Past Medical History:   Diagnosis Date   ??? End stage renal disease (CMS-HCC) 11/08/2012   ??? ESRD on hemodialysis (CMS-HCC) 08/21/2011    Pt gets dialysis with [facility] in [address], [place], on a TTS schedule.  She started dialysis in Feb 2014.  Etiology of renal failure not known, likely diabetes.  Has a left upper arm AV graft as of Dec 2014.   On transplant list since 2015.  Dr. Cherylann Ratel RN [facility] Harman [phone]  Last Assessment & Plan:  Stable. continues to follow with lateef. On kidney transplant list.   ??? Other and unspecified hyperlipidemia 11/08/2012   ??? Type II or unspecified type diabetes mellitus 11/08/2012   ??? Unspecified essential hypertension 11/08/2012       PAST SURGICAL HISTORY:   Past Surgical History:   Procedure Laterality Date   ??? AV FISTULA PLACEMENT     ??? HYSTERECTOMY  2000   ??? PR TRANSPLANTATION OF KIDNEY Left 03/30/2019    Procedure: RENAL ALLOTRANSPLANTATION, IMPLANTATION OF GRAFT; WITHOUT RECIPIENT NEPHRECTOMY;  Surgeon: Loney Hering, MD;  Location: MAIN [facility];  Service: Transplant   ??? PR UPPER GI ENDOSCOPY,BIOPSY N/A 05/04/2019    Procedure: UGI ENDOSCOPY; WITH BIOPSY, SINGLE OR MULTIPLE;  Surgeon: Jules Husbands, MD;  [facility];  Service: Gastroenterology   ??? TOTAL ABDOMINAL HYSTERECTOMY W/ BILATERAL SALPINGOOPHORECTOMY  2000       MEDICATIONS:   Current Outpatient Medications Medication Sig Dispense Refill   ??? acetaminophen (TYLENOL) 500 MG tablet Take 1-2 tablets (500-1,000 mg total) by mouth every six (6) hours as needed for pain or fever (> 38C or 100.45F). 30 tablet 0   ??? albuterol (PROVENTIL HFA;VENTOLIN HFA) 90 mcg/actuation inhaler Inhale 2 puffs every six (6) hours as needed for wheezing.     ??? amLODIPine (NORVASC) 10 MG tablet Take 1 tablet (10 mg total) by mouth daily. 30 tablet 11   ??? aspirin (ECOTRIN) 81 MG tablet Take 81 mg by mouth daily.     ??? atorvastatin (LIPITOR) 40 MG tablet Take 1 tablet (40 mg total) by mouth daily. 30 tablet 11   ??? blood sugar diagnostic (CONTOUR TEST STRIPS) Strp by Other route two (2) times a day. TEST BLOOD SUGARS 2 TIMES DAILY 60 strip 5   ??? blood-glucose meter (ONETOUCH ULTRA SYSTEM KIT) kit Please dispense for patient to use at home to check blood sugars.     ??? carvediloL (COREG) 12.5 MG tablet Take 3 tablets (37.5 mg total) by mouth Two (2) times a day. 180 tablet 11   ??? cloNIDine HCL (CATAPRES) 0.1 MG tablet Take 2 tablets (0.2 mg total) by mouth every eight (8) hours. 180 tablet 11   ??? ergocalciferol (VITAMIN D2) 1,250 mcg (50,000 unit) capsule Take 1 capsule (50,000 Units total) by mouth once a week. Take  for 8 weeks 4 capsule 2   ??? LANCETS,THIN MISC Using at home to test blood sugars twice daily.     ??? lidocaine 2% viscous (XYLOCAINE) 2 % Soln Use 15 mL by Mouth as directed Every four (4) hours. 2000 mL 5   ??? magnesium oxide-Mg AA chelate (MAGNESIUM, AMINO ACID CHELATE,) 133 mg Tab Take 1 tablet by mouth Two (2) times a day. 100 tablet 11   ??? metFORMIN (GLUCOPHAGE XR) 500 MG 24 hr tablet Take 1 tablet (500 mg total) by mouth two (2) times a day. 60 tablet 11   ??? mirtazapine (REMERON) 15 MG tablet Take 1 tablet (15 mg total) by mouth nightly. 90 tablet 3   ??? mycophenolate (MYFORTIC) 360 MG TbEC Take 1 tablet (360 mg total) by mouth two (2) times a day. 60 tablet 11 ??? nitroglycerin (NITROSTAT) 0.4 MG SL tablet Place 0.4 mg under the tongue daily as needed.     ??? pantoprazole (PROTONIX) 40 MG tablet Take 1 tablet (40 mg total) by mouth Two (2) times a day. 60 tablet 11   ??? pregabalin (LYRICA) 75 MG capsule Take 1 capsule (75 mg total) by mouth Two (2) times a day. 60 capsule 3   ??? promethazine (PHENERGAN) 25 MG tablet Take 1 tablet (25 mg total) by mouth every six (6) hours as needed for up to 28 doses. 30 each 3   ??? senna (SENOKOT) 8.6 mg tablet Take 1 tablet by mouth nightly as needed for constipation. 30 tablet 11   ??? sucralfate (CARAFATE) 100 mg/mL suspension Take 10 mL (1 g total) by mouth Every six (6) hours. 1200 mL 0   ??? sulfamethoxazole-trimethoprim (BACTRIM) 400-80 mg per tablet Take 1 tablet (80 mg of trimethoprim total) by mouth Every Monday, Wednesday, and Friday. 12 tablet 5   ??? tacrolimus (ENVARSUS XR) 1 mg Tb24 extended release tablet Take 2 (1mg ) tablets with 3 (4mg ) tablets every morning to equal 14mg  daily dose 60 tablet 11   ??? tacrolimus (ENVARSUS XR) 4 mg Tb24 extended release tablet Take 3 (4mg ) tablets with 2 (1mg ) tablets every morning to equal 14mg  daily dose 90 tablet 11   ??? valGANciclovir (VALCYTE) 450 mg tablet Take 1 tablet (450 mg total) by mouth daily. 30 tablet 2     No current facility-administered medications for this visit.        ALLERGIES:   Allergies   Allergen Reactions   ??? Hydrocodone Hives   ??? Lisinopril      Unknown reaction    ??? Perfume        FAMILY HISTORY:   Family History   Problem Relation Age of Onset   ??? Heart disease Mother    ??? Diabetes Mother    ??? Cancer Father    ??? Colon cancer Father 72   ??? Colon cancer Sister 22   ??? Breast cancer Sister        SOCIAL HISTORY:   Social History     Socioeconomic History   ??? Marital status: Widowed     Spouse name: None   ??? Number of children: None   ??? Years of education: None   ??? Highest education level: None   Occupational History   ??? None   Social Needs ??? Financial resource strain: Not hard at all   ??? Food insecurity     Worry: Never true     Inability: Never true   ??? Transportation needs     Medical: No  Non-medical: No   Tobacco Use   ??? Smoking status: Former Smoker     Packs/day: 0.25     Years: 30.00     Pack years: 7.50     Types: Cigarettes     Quit date: 05/09/2013     Years since quitting: 6.1   ??? Smokeless tobacco: Never Used   ??? Tobacco comment: 1 pack every week; she reports historical periods of abstinence   Substance and Sexual Activity   ??? Alcohol use: Not Currently     Comment: very rare use (reports once over last year)   ??? Drug use: No   ??? Sexual activity: None   Lifestyle   ??? Physical activity     Days per week: 0 days     Minutes per session: 0 min   ??? Stress: Not at all   Relationships   ??? Social connections     Talks on phone: More than three times a week     Gets together: More than three times a week     Attends religious service: Never     Active member of club or organization: No     Attends meetings of clubs or organizations: Never     Relationship status: Widowed   Other Topics Concern   ??? None   Social History Narrative   ??? None       REVIEW OF SYSTEMS:   10-system review of systems negative other than what is mentioned above.  The patient was asked to review all abnormal responses not pertinent to today's visit with their primary care physician.    PHYSICAL EXAM:   GENERAL: Pleasant female in no acute distress.   VITAL SIGNS: Blood pressure 135/65, pulse 77, temperature 35.7 ??C (96.3 ??F), temperature source Temporal, resp. rate 18, SpO2 99 %, not currently breastfeeding.  HEENT: Normocephalic, atraumatic, extraocular muscles intact  NECK: Supple, no lymphadenopathy  CARDIOVASCULAR: No peripheral edema  PULMONARY: Normal work of breathing, no use of accessory muscles  ABDOMEN: Soft, non-tender, non-distended. No organomegaly or hernias.  BACK: No costovertebral angle tenderness, no spiny bone tenderness. EXTREMITIES: No clubbing, cyanosis or edema.   NEUROLOGIC:  Cranial nerves II-XII grossly intact  PSYCHOLOGIC: Normal affect, normal mood  SKIN: Warm and dry. No lesions.  GU: nl external genitalia    LAB RESULTS:   Results for orders placed or performed in visit on 06/06/19   Renal Function Panel   Result Value Ref Range    Glucose 153 (H) 65 - 99 mg/dL    BUN 12 8 - 27 mg/dL    Creatinine 1.61 (H) 0.57 - 1.00 mg/dL    GFR MDRD Non Af Amer 60 >59 mL/min/1.73    GFR MDRD Af Amer 69 >59 mL/min/1.73    BUN/Creatinine Ratio 12 12 - 28    Sodium 141 134 - 144 mmol/L    Potassium 3.5 3.5 - 5.2 mmol/L    Chloride 105 96 - 106 mmol/L    CO2 20 20 - 29 mmol/L    Calcium 10.3 8.7 - 10.3 mg/dL    Phosphorus, Serum 3.1 3.0 - 4.3 mg/dL    Albumin 3.9 3.8 - 4.8 g/dL   Tacrolimus level   Result Value Ref Range    Tacrolimus Lvl 5.3 2.0 - 20.0 ng/mL   CBC w/ Differential   Result Value Ref Range    WBC 3.0 (L) 3.4 - 10.8 x10E3/uL    RBC 3.01 (L) 3.77 - 5.28 x10E6/uL  HGB 9.1 (L) 11.1 - 15.9 g/dL    HCT 16.1 (L) 09.6 - 46.6 %    MCV 93 79 - 97 fL    MCH 30.2 26.6 - 33.0 pg    MCHC 32.5 31.5 - 35.7 g/dL    RDW 04.5 (H) 40.9 - 15.4 %    Platelet 238 150 - 450 x10E3/uL    Neutrophils % 82 Not Estab. %    Lymphocytes % 3 Not Estab. %    Monocytes % 8 Not Estab. %    Eosinophils % 4 Not Estab. %    Basophils % 2 Not Estab. %    Absolute Neutrophils 2.5 1.4 - 7.0 x10E3/uL    Absolute Lymphocytes 0.1 (L) 0.7 - 3.1 x10E3/uL    Absolute Monocytes  0.2 0.1 - 0.9 x10E3/uL    Absolute Eosinophils 0.1 0.0 - 0.4 x10E3/uL    Absolute Basophils  0.1 0.0 - 0.2 x10E3/uL    Immature Granulocytes 1 Not Estab. %    Bands Absolute 0.0 0.0 - 0.1 x10E3/uL   Magnesium Level   Result Value Ref Range    Magnesium 1.2 (L) 1.6 - 2.3 mg/dL     Ordered at this visit: No orders of the defined types were placed in this encounter.      No results found for: PSASCRN, PSADIAG    Lab Results   Component Value Date    WBC 4.5 06/13/2019    HGB 9.6 (L) 06/13/2019 HCT 28.8 (L) 06/13/2019    PLT 221 06/13/2019       Lab Results   Component Value Date    NA 141 06/13/2019    K 3.3 (L) 06/13/2019    CL 106 06/13/2019    CO2 22 06/13/2019    BUN 11 06/13/2019    CREATININE 1.12 (H) 06/13/2019    GLU 136 (H) 05/20/2019    CALCIUM 9.7 06/13/2019    MG 1.1 (L) 06/13/2019    PHOS 3.0 05/20/2019       Lab Results   Component Value Date    BILITOT 0.5 05/20/2019    BILIDIR 0.10 05/20/2019    PROT 6.3 (L) 05/20/2019    ALBUMIN 3.8 05/20/2019    ALT 6 05/20/2019    AST 15 05/20/2019    ALKPHOS 88 05/20/2019    GGT 11 03/28/2019       Lab Results   Component Value Date    LABPROT 12.1 04/20/2014    INR 1.11 05/01/2019    APTT 36.8 05/01/2019                   Procedure: Cystourethroscopy, stent removal (81191)    Indication: s/p renal transplant 03/30/19, here for stent removal.    Antibiotic prophylaxis: Bactrim    Description:   Time out was performed immediately prior to the procedure.     The patient was prepped and draped in the usual sterile fashion in the relaxed dorsal lithotomy position. Flexible cystoscopy was performed. The stent was identified, grasped and removed without difficulty. The procedure was tolerated well. There were no complications.

## 2019-06-14 LAB — RENAL FUNCTION PANEL
ALBUMIN: 4 g/dL (ref 3.8–4.8)
BLOOD UREA NITROGEN: 11 mg/dL (ref 8–27)
BUN / CREAT RATIO: 10 — ABNORMAL LOW (ref 12–28)
CHLORIDE: 106 mmol/L (ref 96–106)
CO2: 22 mmol/L (ref 20–29)
CREATININE: 1.12 mg/dL — ABNORMAL HIGH (ref 0.57–1.00)
GFR MDRD AF AMER: 61 mL/min/{1.73_m2}
GFR MDRD NON AF AMER: 53 mL/min/{1.73_m2} — ABNORMAL LOW
GLUCOSE: 157 mg/dL — ABNORMAL HIGH (ref 65–99)
PHOSPHORUS, SERUM: 3.1 mg/dL (ref 3.0–4.3)
POTASSIUM: 3.3 mmol/L — ABNORMAL LOW (ref 3.5–5.2)
SODIUM: 141 mmol/L (ref 134–144)

## 2019-06-14 LAB — CBC W/ DIFFERENTIAL
BASOPHILS ABSOLUTE COUNT: 0.1 10*3/uL (ref 0.0–0.2)
BASOPHILS RELATIVE PERCENT: 1 %
EOSINOPHILS ABSOLUTE COUNT: 0.1 10*3/uL (ref 0.0–0.4)
EOSINOPHILS RELATIVE PERCENT: 3 %
HEMATOCRIT: 28.8 % — ABNORMAL LOW (ref 34.0–46.6)
HEMOGLOBIN: 9.6 g/dL — ABNORMAL LOW (ref 11.1–15.9)
IMMATURE GRANULOCYTES: 1 %
LYMPHOCYTES ABSOLUTE COUNT: 0.1 10*3/uL — ABNORMAL LOW (ref 0.7–3.1)
LYMPHOCYTES RELATIVE PERCENT: 3 %
MEAN CORPUSCULAR HEMOGLOBIN CONC: 33.3 g/dL (ref 31.5–35.7)
MEAN CORPUSCULAR HEMOGLOBIN: 30.6 pg (ref 26.6–33.0)
MEAN CORPUSCULAR VOLUME: 92 fL (ref 79–97)
MONOCYTES ABSOLUTE COUNT: 0.3 10*3/uL (ref 0.1–0.9)
MONOCYTES RELATIVE PERCENT: 6 %
NEUTROPHILS ABSOLUTE COUNT: 3.8 10*3/uL (ref 1.4–7.0)
NEUTROPHILS RELATIVE PERCENT: 86 %
PLATELET COUNT: 221 10*3/uL (ref 150–450)
RED CELL DISTRIBUTION WIDTH: 16 % — ABNORMAL HIGH (ref 11.7–15.4)
WHITE BLOOD CELL COUNT: 4.5 10*3/uL (ref 3.4–10.8)

## 2019-06-14 LAB — MEAN CORPUSCULAR HEMOGLOBIN: Erythrocyte mean corpuscular hemoglobin:EntMass:Pt:RBC:Qn:Automated count: 30.6

## 2019-06-14 LAB — MAGNESIUM: Magnesium:MCnc:Pt:Ser/Plas:Qn:: 1.1 — ABNORMAL LOW

## 2019-06-14 LAB — BLOOD UREA NITROGEN: Urea nitrogen:MCnc:Pt:Ser/Plas:Qn:: 11

## 2019-06-14 MED FILL — SUCRALFATE 100 MG/ML ORAL SUSPENSION: 30 days supply | Qty: 1200 | Fill #0 | Status: AC

## 2019-06-14 MED FILL — CLONIDINE HCL 0.1 MG TABLET: ORAL | 30 days supply | Qty: 180 | Fill #1

## 2019-06-14 MED FILL — CLONIDINE HCL 0.1 MG TABLET: 30 days supply | Qty: 180 | Fill #1 | Status: AC

## 2019-06-15 DIAGNOSIS — Z940 Kidney transplant status: Principal | ICD-10-CM

## 2019-06-15 LAB — TACROLIMUS BLOOD: Tacrolimus:MCnc:Pt:Bld:Qn:LC/MS/MS: 6.4

## 2019-06-15 MED ORDER — TACROLIMUS ER 1 MG TABLET,EXTENDED RELEASE 24 HR
ORAL_TABLET | 11 refills | 0 days | Status: CP
Start: 2019-06-15 — End: ?

## 2019-06-15 MED ORDER — TACROLIMUS ER 4 MG TABLET,EXTENDED RELEASE 24 HR
ORAL_TABLET | 11 refills | 0 days | Status: CP
Start: 2019-06-15 — End: ?
  Filled 2019-06-21: qty 60, 30d supply, fill #0

## 2019-06-15 NOTE — Unmapped (Signed)
Per [name] NP increase Envarsus to 14mg  daily    Called and updated patient on recent labs and need to increase her dose and she verbalized understanding      Reports she had her stent removed yesterday and has had some bleeding and lower abdominal cramping. Encouraged her to continue to drink a lot of water and let me know if she is not feeling better tomorrow.    Verbalized understanding

## 2019-06-16 ENCOUNTER — Other Ambulatory Visit: Payer: Self-pay

## 2019-06-16 ENCOUNTER — Encounter: Payer: Self-pay | Admitting: Internal Medicine

## 2019-06-16 ENCOUNTER — Ambulatory Visit (INDEPENDENT_AMBULATORY_CARE_PROVIDER_SITE_OTHER): Payer: Medicare Other | Admitting: Internal Medicine

## 2019-06-16 VITALS — BP 156/70 | Ht 67.0 in | Wt 151.6 lb

## 2019-06-16 DIAGNOSIS — G8929 Other chronic pain: Secondary | ICD-10-CM

## 2019-06-16 DIAGNOSIS — Z940 Kidney transplant status: Secondary | ICD-10-CM | POA: Diagnosis not present

## 2019-06-16 DIAGNOSIS — E1142 Type 2 diabetes mellitus with diabetic polyneuropathy: Secondary | ICD-10-CM

## 2019-06-16 DIAGNOSIS — M545 Low back pain: Secondary | ICD-10-CM | POA: Diagnosis not present

## 2019-06-16 DIAGNOSIS — N186 End stage renal disease: Secondary | ICD-10-CM

## 2019-06-16 DIAGNOSIS — D649 Anemia, unspecified: Secondary | ICD-10-CM

## 2019-06-16 DIAGNOSIS — R269 Unspecified abnormalities of gait and mobility: Secondary | ICD-10-CM

## 2019-06-16 DIAGNOSIS — E559 Vitamin D deficiency, unspecified: Secondary | ICD-10-CM

## 2019-06-16 DIAGNOSIS — Z992 Dependence on renal dialysis: Secondary | ICD-10-CM

## 2019-06-16 DIAGNOSIS — Z79899 Other long term (current) drug therapy: Secondary | ICD-10-CM | POA: Diagnosis not present

## 2019-06-16 DIAGNOSIS — Z794 Long term (current) use of insulin: Secondary | ICD-10-CM

## 2019-06-16 DIAGNOSIS — E1122 Type 2 diabetes mellitus with diabetic chronic kidney disease: Secondary | ICD-10-CM

## 2019-06-16 DIAGNOSIS — R5381 Other malaise: Secondary | ICD-10-CM

## 2019-06-16 DIAGNOSIS — K2210 Ulcer of esophagus without bleeding: Secondary | ICD-10-CM

## 2019-06-16 DIAGNOSIS — E041 Nontoxic single thyroid nodule: Secondary | ICD-10-CM

## 2019-06-16 DIAGNOSIS — I10 Essential (primary) hypertension: Secondary | ICD-10-CM

## 2019-06-16 NOTE — Progress Notes (Signed)
Virtual Visit via Video Note  I connected with Theresa [name]  on 06/16/19 at  2:47 PM EST by a video enabled telemedicine application and verified that I am speaking with the correct person using two identifiers.  Location patient: home Location provider:work or home office Persons participating in the virtual visit: patient, provider, pts family whom live with her  I discussed the limitations of evaluation and management by telemedicine and the availability of in person appointments. The patient expressed understanding and agreed to proceed.   HPI: Late HFU due to no availability admitted to [facility] hospital 05/01/19 to 05/10/19 1 month post renal transplant for n/v/chest pain found to have esophageal ulcers and ureteral stent placed   1. S/p renal transplant 03/30/19 and stopped HD at this time doing well she recently had a ureteral stent and removal post transplant and is doing better. Denies n/v, chest pain which she had initially   She is going to [facility] 2x per week Mon. Thursday for f/u no signs of rejection of kidney transplant and she is very happy about this she waited 6 years and 8 months for kidney transplant  She is on bactrim and will be on this MWF until approximately 09/2019 post transplant   Sp EGD 04/2019 with below multiple esophageal ulcers on protonix 40 mg bid  2. Esophageal ulcers recently neg bx and negative H pylori as of 04/2019 has repeat f/u 06/2019 with [facility]  On protonix 40 mg bid and carafate  Will call her pharmacy at [facility] and confirm all meds  3. Called pharmacy spoke with [place] [phone] fax [phone] extensive med review shows a lot of new medications I.e transplant meds and doses of meds changed or not noted to be on some meds extensive med review needs to be done at [facility] renal appt 06/24/19 will CC Bhakti and her NP kidney provider upcoming.   Pt was unsure of medications will have her take all meds to the appt upcoming for review and update  of meds  Pt reports she is no longer using [place]  4. HTN she reports BP 118-156/50s-60s  Reviewed BP meds with her pharmacy on norvasc 10 mg qd, coreg 37.5 bid, clonidine 0.2 tid per her current pharmacy   We also have her on losartan 50 mg qd but her new pharmacy does not have her on this medication and this needs to be confirmed.   5. Physical deconditioning s/p transplant 03/30/19 with abnormal gait and weakness s/p hospital  [facility] x 9 days in 05/2019 admitted 05/01/19-05/10/19 she had renal transplant 03/30/19 and cystourethroscopy and stent removal with urology 06/13/2019 [facility]. She reports she had vaginal bleeding x 2 days after stent removal but this has resolved. She has walker at home and cane but reports will use cane but not walker and she is getting PT at home currently. She is currently not using any walking device  She has a stationary bike at home which she is trying to use to work on endurance and strength.   6. Thyroid nodules history still needs thyroid US resch not on M/Thurs due to [facility] appts   ROS: See pertinent positives and negatives per HPI.  Past Medical History:  Diagnosis Date  . Anemia of chronic disease   . Anginal pain ([place])   . Carotid arterial disease ([place])    a. 02/2013 U/S: 40-59% bilat ICA stenosis.  . Chronic constipation   . Chronic diastolic CHF (congestive heart  failure) ([place])    a. 10/2013 Echo [facility]): EF 55-60%, mod conc LVH, mod MR, mildly dil LA, mild Ao sclerosis w/o stenosis; b. 02/2016 Echo: EF 55-60%, no rwma, Gr DD, mild AS, mod to sev MR, mildly dil LA, PASP 4mmHg; c. 07/2017 Echo [facility]): EF >55%, mild to mod LVH, Gr2 DD, Ao scl, Mod MR, mod dil LA, nl RV fxn.  . Colon polyps   . COPD (chronic obstructive pulmonary disease) ([place])   . Coronary artery disease    a. 05/2013 NSTEMI/PCI: LM 20d, LAD min irregs, LCX small, nl, OM1 nl, RCA dom 71m (2.5x16 Promus DES), PDA1 80p; b. 04/2015 Cath: LM nl, LAD 49m, D1/2 min irregs, LCX 35p/m, OM2/3 min  irregs, RCA patent mid stent, RPDA 70ost, RPLB1 30, RPLB2/3 min irregs, EF 55-65%--> Med Rx; c. 06/2017 [facility]): No ischemia/infarct, EF 64%.  . Diabetes mellitus   . Diabetic neuropathy ([place])   . Diabetic retinopathy ([place]) 05/28/2013   Hx bilat retinal detachment, proliferative diab retinopathy and bilat vitreous hemorrhage   . Emphysema   . ESRD on hemodialysis ([place])    a. [facility] in [place], [place] [phone]/Dr. [name], on a MWF schedule.  She started dialysis in Feb 2014.  Etiology of renal failure not known, likely diabetes.  Has a left upper arm AV graft.  . History of bronchitis    Mar 2012  . History of pneumonia    June 2012  . History of tobacco abuse    a. Quit 2012.  Marland Kitchen Hyperlipidemia   . Hypertension   . Moderate to severe mitral insufficiency    a. 10/2013 Echo: EF 55-60%, mod MR; b. 02/2016 Echo: EF 55-60%, mod to sev MR directed posteriorly--felt to be dynamic -worse with volume overload; c. 07/2017 Echo [facility]): EF >55%. Mod MR.  . Myocardial infarct ([place]) 05/2013  . Peripheral vascular disease ([place])   . Sickle cell trait ([place])   . Thyroid nodule    [place] 06/2017 due to f/u US in 1 year     Past Surgical History:  Procedure Laterality Date  . A/V FISTULAGRAM Left 11/10/2017   Procedure: A/V FISTULAGRAM;  Surgeon: Katha Cabal, MD;  Location: [place] CV LAB;  Service: Cardiovascular;  Laterality: Left;  . A/V SHUNTOGRAM Left 02/08/2019   Procedure: A/V SHUNTOGRAM;  Surgeon: Katha Cabal, MD;  Location: [place] CV [facility];  Service: Cardiovascular;  Laterality: Left;  . ABDOMINAL HYSTERECTOMY     2000  . CARDIAC CATHETERIZATION    . CARDIAC CATHETERIZATION N/A 05/02/2015   Procedure: Left Heart Cath and Coronary Angiography;  Surgeon: [name], MD;  Location: [place] CV LAB;  Service: Cardiovascular;  Laterality: N/A;  . COLONOSCOPY WITH PROPOFOL N/A 07/08/2016   Procedure: COLONOSCOPY WITH PROPOFOL;  Surgeon: [name], MD;  Location: [facility]  ENDOSCOPY;  Service: Endoscopy;  Laterality: N/A;  . COLONOSCOPY WITH PROPOFOL N/A 08/20/2017   Procedure: COLONOSCOPY WITH PROPOFOL;  Surgeon: [name], MD;  Location: [facility] ENDOSCOPY;  Service: Gastroenterology;  Laterality: N/A;  . colonscopy    . CORONARY ANGIOPLASTY  05/28/2014   stent placement to the mid RCA  . [place] [facility]rology with stent placement 11 or 05/2019 and stent removal 06/13/19   . DILATION AND CURETTAGE OF UTERUS     several in the early 80's  . ESOPHAGOGASTRODUODENOSCOPY     2012  . ESOPHAGOGASTRODUODENOSCOPY (EGD) WITH PROPOFOL N/A 03/11/2018   Procedure: ESOPHAGOGASTRODUODENOSCOPY (EGD) WITH PROPOFOL;  Surgeon: [name], MD;  Location: [facility] ENDOSCOPY;  Service: Gastroenterology;  Laterality: N/A;  . EYE SURGERY     bilateral laser 2012  . EYE SURGERY     right  . EYE SURGERY     x4 both eyes  . GAS INSERTION  09/30/2011   Procedure: INSERTION OF GAS;  Surgeon: [name], MD;  Location: [place];  Service: Ophthalmology;  Laterality: Right;  [id number]  . GAS/FLUID EXCHANGE  09/30/2011   Procedure: GAS/FLUID EXCHANGE;  Surgeon: [name], MD;  Location: [place];  Service: Ophthalmology;  Laterality: Right;  . KIDNEY TRANSPLANT     03/30/19 Dr. Mickel Baas Thomas/Dr. Cristie Hem Zendel/Dr. Gilford Raid; [facility] transplant Dr. Horald Chestnut will be primary [phone] coordinator Sarah Wynkoop [phone]  . KIDNEY TRANSPLANT     03/30/19 [facility]  . LEFT HEART CATHETERIZATION WITH CORONARY ANGIOGRAM N/A 05/28/2013   Procedure: LEFT HEART CATHETERIZATION WITH CORONARY ANGIOGRAM;  Surgeon: [name], MD;  Location: [facility] CATH LAB;  Service: Cardiovascular;  Laterality: N/A;  . PARS PLANA VITRECTOMY  04/22/2011   Procedure: PARS PLANA VITRECTOMY WITH 25 GAUGE;  Surgeon: [name], MD;  Location: [place];  Service: Ophthalmology;  Laterality: Left;  membrane peel, endolaser, gas fluid exchange, silicone oil, repair of complex traction retinal detachment  . PARS  PLANA VITRECTOMY  09/30/2011   Procedure: PARS PLANA VITRECTOMY WITH 25 GAUGE;  Surgeon: [name], MD;  Location: [place];  Service: Ophthalmology;  Laterality: Right;  Endolaser; Repair of Complex Traction Retinal Detachment  . PARS PLANA VITRECTOMY  02/24/2012   Procedure: PARS PLANA VITRECTOMY WITH 25 GAUGE;  Surgeon: [name], MD;  Location: [place];  Service: Ophthalmology;  Laterality: Left;  . PTCA    . SILICON OIL REMOVAL  09/02/7122   Procedure: SILICON OIL REMOVAL;  Surgeon: [name], MD;  Location: [place];  Service: Ophthalmology;  Laterality: Left;  . THROMBECTOMY / ARTERIOVENOUS GRAFT REVISION    . TUBAL LIGATION     1979  . UPPER EXTREMITY ANGIOGRAPHY Left 02/08/2019   Procedure: UPPER EXTREMITY ANGIOGRAPHY;  Surgeon: Katha Cabal, MD;  Location: [place] CV LAB;  Service: Cardiovascular;  Laterality: Left;    Family History  Problem Relation Age of Onset  . Stroke Mother   . Heart attack Mother   . Heart disease Mother   . Colon cancer Father   . Colon cancer Sister   . Heart attack Brother   . Stroke Brother   . Diabetes Brother   . Breast cancer Sister   . Diabetes Brother   . Diabetes Daughter   . Renal Disease Daughter        on HD  . Anesthesia problems Neg Hx   . Hypotension Neg Hx   . Malignant hyperthermia Neg Hx   . Pseudochol deficiency Neg Hx     SOCIAL HX: lives home with family   Current Outpatient Medications:  .  albuterol (PROVENTIL) (2.5 MG/3ML) 0.083% nebulizer solution, Take 3 mLs (2.5 mg total) by nebulization every 6 (six) hours as needed for wheezing or shortness of breath., Disp: 75 mL, Rfl: 11 .  albuterol (VENTOLIN HFA) 108 (90 Base) MCG/ACT inhaler, Inhale 1-2 puffs into the lungs every 6 (six) hours as needed for wheezing or shortness of breath., Disp: 1 Inhaler, Rfl: 12 .  amLODipine (NORVASC) 10 MG tablet, Take 10 mg by mouth daily. , Disp: , Rfl:  .  aspirin  EC 81 MG tablet, Take 1 tablet (81 mg total) by  mouth at bedtime., Disp: 30 tablet, Rfl: 11 .  atorvastatin (LIPITOR) 40 MG tablet, Take 40 mg by mouth daily at 6 PM., Disp: , Rfl:  .  AURYXIA 1 GM 210 MG(Fe) tablet, Take 210-630 mg by mouth 3 (three) times daily with meals. Take 630 mg with each meal and 210 mg with each snack, Disp: , Rfl: 12 .  carvedilol (COREG) 25 MG tablet, Take 1.5 tablets (37.5 mg total) by mouth 2 (two) times daily with a meal., Disp: , Rfl:  .  cetirizine (ZYRTEC) 5 MG tablet, Take 1 tablet (5 mg total) by mouth daily as needed for allergies. (Patient taking differently: Take 5 mg by mouth daily. ), Disp: 90 tablet, Rfl: 3 .  CHELATED MAGNESIUM PO, Take 133 mg by mouth 2 (two) times daily., Disp: , Rfl:  .  Cholecalciferol 1.25 MG (50000 UT) TABS, Take by mouth once a week. X 8 weeks, Disp: , Rfl:  .  cloNIDine (CATAPRES) 0.2 MG tablet, Take 1 tablet (0.2 mg total) by mouth 3 (three) times daily., Disp: , Rfl:  .  donepezil (ARICEPT) 5 MG tablet, Take 5 mg by mouth daily., Disp: , Rfl:  .  isosorbide mononitrate (IMDUR) 60 MG 24 hr tablet, Take 1 tablet (60 mg total) by mouth daily., Disp: 30 tablet, Rfl: 11 .  losartan (COZAAR) 50 MG tablet, Take 50 mg by mouth daily. , Disp: , Rfl:  .  megestrol (MEGACE) 20 MG tablet, Take 10 mg by mouth daily. Dr. Inocente Salles, Disp: , Rfl:  .  metFORMIN (GLUCOPHAGE) 500 MG tablet, Take by mouth 2 (two) times daily with a meal., Disp: , Rfl:  .  mirtazapine (REMERON) 15 MG tablet, Take 15 mg by mouth at bedtime., Disp: , Rfl:  .  multivitamin (RENA-VIT) TABS tablet, Take 1 tablet by mouth daily., Disp: , Rfl:  .  mycophenolate (MYFORTIC) 360 MG TBEC EC tablet, Take 360 mg by mouth 2 (two) times daily., Disp: , Rfl:  .  nitroGLYCERIN (NITROSTAT) 0.4 MG SL tablet, Place 1 tablet (0.4 mg total) under the tongue every 5 (five) minutes as needed for chest pain., Disp: 25 tablet, Rfl: 3 .  pantoprazole (PROTONIX) 40 MG tablet, Take 40 mg by mouth 2 (two) times daily., Disp: , Rfl:  .   pregabalin (LYRICA) 75 MG capsule, Take 1 capsule (75 mg total) by mouth 2 (two) times daily., Disp: , Rfl:  .  senna (SENOKOT) 8.6 MG TABS tablet, Take 1 tablet by mouth at bedtime as needed for mild constipation., Disp: , Rfl:  .  sucralfate (CARAFATE) 1 GM/10ML suspension, Take 1 g by mouth 4 (four) times daily -  with meals and at bedtime. 10 ml Q6 hrs, Disp: , Rfl:  .  sulfamethoxazole-trimethoprim (BACTRIM) 400-80 MG tablet, Take 1 tablet by mouth every Monday, Wednesday, and Friday. Stop date 09/2019, Disp: , Rfl:  .  Tacrolimus ER (ENVARSUS XR) 1 MG TB24, Take 2 tablets by mouth every morning., Disp: , Rfl:  .  Tacrolimus ER 4 MG TB24, Take 2 tablets by mouth every morning. Total dose 14 mg daily in am, Disp: , Rfl:  .  UNABLE TO FIND, TEST BLOOD SUGARS 2 TIMES DAILY, Disp: , Rfl:  .  valGANciclovir (VALCYTE) 450 MG tablet, Take 450 mg by mouth daily., Disp: , Rfl:  .  furosemide (LASIX) 80 MG tablet, Take 1 tablet (80 mg total) by mouth See  admin instructions. Take 80 mg by mouth once daily on Sun, Tue, Thur, and Sat (non dialysis days) (Patient not taking: Reported on 06/20/2019), Disp: 48 tablet, Rfl: 3  EXAM:  VITALS per patient if applicable:  GENERAL: alert, oriented, appears well and in no acute distress  HEENT: atraumatic, conjunttiva clear, no obvious abnormalities on inspection of external nose and ears  NECK: normal movements of the head and neck  LUNGS: on inspection no signs of respiratory distress, breathing rate appears normal, no obvious gross SOB, gasping or wheezing  CV: no obvious cyanosis  MS: moves all visible extremities without noticeable abnormality  PSYCH/NEURO: pleasant and cooperative, no obvious depression or anxiety, speech and thought processing grossly intact  ASSESSMENT AND PLAN:  Discussed the following assessment and plan:  Essential hypertension - Plan: cloNIDine (CATAPRES) 0.2 MG tablet tid, carvedilol (COREG) 37.5 MG tablet bid, norvasc 10 mg  qd  It needs to be clarified if pt taking losartan 50 mg qd  She is unsure of all the meds she is taking I called her pharmacy today spoke with Bhakti Patell [phone] [facility] to clarify meds  Still some meds below end of note which need to be reconciled  Will CC pharmacy and [facility] renal and transplant teams  Diabetic polyneuropathy associated with type 2 diabetes mellitus A1C 4.9 12/11//20 s/p renal transplant 03/30/19 HD ended 03/30/19 - Plan: pregabalin (LYRICA) 75 MG capsule bid  She was placed back on metformin which was noted by pharmacy   Chronic low back pain, unspecified back pain laterality, unspecified whether sciatica present - Plan: pregabalin (LYRICA) 75 MG capsule bid  F/u chronic pain clinic prn or NS dr. Drue Second [place]  Thyroid nodule Will resch thyroid US but not on M/thurs as she has appts at [facility]  -ordered TSH and Ft4   Ulcer of esophagus without bleeding bxs neg H pylori and hsv 04/2019 egd  -on protonix and carafate  [facility]u [facility]   Physical deconditioning abnormal gait due to weakness lower legs w/o falls  -rec she use her cane she declines to use walker  Continue home PT   HM Flu shot deferred ? If had 03/18/2019 referred per pt reports she did not have  pna 23utd prevnar vaccine needed in future  Tetanus UTD 06/09/10  Hep B immune had engerix as well 4 doses 2014-2015  Consider shingrix in futuredisc prev pt unsure if insurance will cover it  HCV neg 04/25/19  Hep B sAb 98.50 03/28/19  HIV neg 03/28/19   Labs reviewed this visit  06/13/19 H/H 9.6/28.8 low and plts nl 221, WBC nl 4.5  ALT 6 05/20/19, AP 88, AST 15 normal  06/13/19 Cr 1.12 GFR 61., Mag 1.1 low, BUN 10 low, K 3.3 low, glucose 157  UA done 05/23/19 with neg blood some rbcs 3-10 this is less and wbc 6-10 A1C 05/20/19 4.9  Lipid 05/20/19 tgs 181 H, tc 159, HDL 53, LDL 70  05/20/19 vitamin D  6.4 low 04/11/19 was 8.4   Pap neg 11/13/16 neg HPVs/p hysterectomy  mammo  neg9/24/19referred mammogram sch 04/05/19 pt pt had to resch given # to call has not been done yet   h/o multiple polypscolonoscopy 08/20/17 repeat in 3 years [facility] or [facility] can do this  -EGD 03/11/18 neg gastritis -EGD ulcers 04/2019 neg HSV/H pylori [facility] gi egd  Former smoker x 25-30 years <1 ppd CTA chest 01/12/18 moderate b/l pleural effusions and CAD and aortic atherosclerosis,  no nodules  Thyroid nodules/parathyroid mass  -reordered thyroid US since 2019 and tsh and ft4 labs  [place] 06/16/17 right upper thyroid nodule ? Parathyroid mass 1.1 cm and left mid nodule f/u annually -wassch 2/28/20but this was canceled consider resch in the futurebut not on Monday/Thurs   GI appt [facility] 06/20/19 f/u esophageal ulcers  Renal appt 06/24/19 [facility] renal    NOT sure if taking these meds need to be reconciled and also reviewed with pt at renal appt 06/24/19 Carney Corners NP  lasix 80 mg qd  imdur 60 mg qd  Losartan 50 mg daily  Megace 20 mg daily  Aspirin 81 mg qd  Auryxia 1 gram  Aricept 5 mg daily 210-630 tid  Zyrtec 5 mg qd   As well as other list of meds needs to be reconciled for correct doses please fax PCP list of current all meds Dr. Olivia Mackie McLean-Scocuzza [phone] I called the pharmacy today dicuss doses of all meds but still need clarification    -we discussed possible serious and likely etiologies, options for evaluation and workup, limitations of telemedicine visit vs in person visit, treatment, treatment risks and precautions. Pt prefers to treat via telemedicine empirically rather then risking or undertaking an in person visit at this moment. Patient agrees to seek prompt in person care if worsening, new symptoms arise, or if is not improving with treatment.   I discussed the assessment and treatment plan with the patient. The patient was provided an opportunity to ask questions and all were answered. The patient agreed with the plan and demonstrated an  understanding of the instructions.   The patient was advised to call back or seek an in-person evaluation if the symptoms worsen or if the condition fails to improve as anticipated.  Time spent 30-39 minutes review of chart, A/P and follow up for med reconciliation  Delorise Jackson, MD

## 2019-06-17 LAB — MAGNESIUM: Magnesium:MCnc:Pt:Ser/Plas:Qn:: 1.3 — ABNORMAL LOW

## 2019-06-17 LAB — RENAL FUNCTION PANEL
ALBUMIN: 3.9 g/dL (ref 3.8–4.8)
BUN / CREAT RATIO: 9 — ABNORMAL LOW (ref 12–28)
CHLORIDE: 106 mmol/L (ref 96–106)
CO2: 20 mmol/L (ref 20–29)
CREATININE: 1.1 mg/dL — ABNORMAL HIGH (ref 0.57–1.00)
GFR MDRD AF AMER: 63 mL/min/{1.73_m2}
GFR MDRD NON AF AMER: 54 mL/min/{1.73_m2} — ABNORMAL LOW
PHOSPHORUS, SERUM: 3.2 mg/dL (ref 3.0–4.3)
POTASSIUM: 3.4 mmol/L — ABNORMAL LOW (ref 3.5–5.2)
SODIUM: 141 mmol/L (ref 134–144)

## 2019-06-17 LAB — CBC W/ DIFFERENTIAL
BANDED NEUTROPHILS ABSOLUTE COUNT: 0 10*3/uL (ref 0.0–0.1)
BASOPHILS ABSOLUTE COUNT: 0.1 10*3/uL (ref 0.0–0.2)
BASOPHILS RELATIVE PERCENT: 3 %
EOSINOPHILS ABSOLUTE COUNT: 0.2 10*3/uL (ref 0.0–0.4)
EOSINOPHILS RELATIVE PERCENT: 5 %
HEMOGLOBIN: 9.4 g/dL — ABNORMAL LOW (ref 11.1–15.9)
IMMATURE GRANULOCYTES: 1 %
LYMPHOCYTES ABSOLUTE COUNT: 0.1 10*3/uL — ABNORMAL LOW (ref 0.7–3.1)
LYMPHOCYTES RELATIVE PERCENT: 3 %
MEAN CORPUSCULAR HEMOGLOBIN CONC: 32.1 g/dL (ref 31.5–35.7)
MEAN CORPUSCULAR HEMOGLOBIN: 30 pg (ref 26.6–33.0)
MEAN CORPUSCULAR VOLUME: 94 fL (ref 79–97)
MONOCYTES ABSOLUTE COUNT: 0.4 10*3/uL (ref 0.1–0.9)
MONOCYTES RELATIVE PERCENT: 12 %
NEUTROPHILS RELATIVE PERCENT: 76 %
PLATELET COUNT: 209 10*3/uL (ref 150–450)
RED BLOOD CELL COUNT: 3.13 x10E6/uL — ABNORMAL LOW (ref 3.77–5.28)
RED CELL DISTRIBUTION WIDTH: 16.2 % — ABNORMAL HIGH (ref 11.7–15.4)
WHITE BLOOD CELL COUNT: 3.3 10*3/uL — ABNORMAL LOW (ref 3.4–10.8)

## 2019-06-17 LAB — EOSINOPHILS RELATIVE PERCENT: Eosinophils/100 leukocytes:NFr:Pt:Bld:Qn:Automated count: 5

## 2019-06-17 LAB — BLOOD UREA NITROGEN: Urea nitrogen:MCnc:Pt:Ser/Plas:Qn:: 10

## 2019-06-17 NOTE — Unmapped (Signed)
[facility] Refill Coordination Note    Specialty Medication(s) to be Shipped:   Transplant: Envarsus 1mg  and Envarsus 4mg     Other medication(s) to be shipped: Vit D2, Pregabalin 75mg , Amlodipine 10mg , Lidocaine 2%, Pantoprazole 40mg  and Metformin 500mg .     Michele Cisneros, DOB: 1955-08-27  Phone: [phone] (home)       All above HIPAA information was verified with patient.     Was a Nurse, learning disability used for this call? No    Completed refill call assessment today to schedule patient's medication shipment from the [facility] [phone]).       Specialty medication(s) and dose(s) confirmed: Patient reports changes to the regimen as follows: 14mg  daily now   Changes to medications: Ieisha reports no changes at this time.  Changes to insurance: No  Questions for the pharmacist: No    Confirmed patient received Welcome Packet with first shipment. The patient will receive a drug information handout for each medication shipped and additional FDA Medication Guides as required.       DISEASE/MEDICATION-SPECIFIC INFORMATION        N/A    SPECIALTY MEDICATION ADHERENCE     Medication Adherence    Patient reported X missed doses in the last month: 0  Specialty Medication: Tacrolimus 1mg   Patient is on additional specialty medications: Yes  Additional Specialty Medications: Tacrolimus 4mg   Patient Reported Additional Medication X Missed Doses in the Last Month: 0  Informant: patient                Envarsus XR 1 mg: 14 days of medicine on hand   Envarsus XR 4 mg: 14 days of medicine on hand       SHIPPING     Shipping address confirmed in Epic.     Delivery Scheduled: Yes, Expected medication delivery date: 06/22/19.     Medication will be delivered via Next Day Courier to the prescription address in Epic [place].    Wyatt Mage M Elisabeth Cara   [facility] Specialty Technician

## 2019-06-18 LAB — TACROLIMUS BLOOD: Tacrolimus:MCnc:Pt:Bld:Qn:LC/MS/MS: 1.6 — ABNORMAL LOW

## 2019-06-20 ENCOUNTER — Ambulatory Visit: Admit: 2019-06-20 | Discharge: 2019-06-21 | Payer: MEDICARE | Attending: Internal Medicine | Primary: Internal Medicine

## 2019-06-20 ENCOUNTER — Telehealth: Payer: Self-pay | Admitting: Internal Medicine

## 2019-06-20 ENCOUNTER — Encounter: Payer: Self-pay | Admitting: Internal Medicine

## 2019-06-20 DIAGNOSIS — K2210 Ulcer of esophagus without bleeding: Principal | ICD-10-CM

## 2019-06-20 DIAGNOSIS — Z940 Kidney transplant status: Principal | ICD-10-CM

## 2019-06-20 DIAGNOSIS — Z6824 Body mass index (BMI) 24.0-24.9, adult: Secondary | ICD-10-CM | POA: Diagnosis not present

## 2019-06-20 DIAGNOSIS — E559 Vitamin D deficiency, unspecified: Secondary | ICD-10-CM | POA: Insufficient documentation

## 2019-06-20 DIAGNOSIS — Z79899 Other long term (current) drug therapy: Secondary | ICD-10-CM | POA: Diagnosis not present

## 2019-06-20 DIAGNOSIS — D849 Immunodeficiency, unspecified: Secondary | ICD-10-CM | POA: Diagnosis not present

## 2019-06-20 DIAGNOSIS — R5381 Other malaise: Secondary | ICD-10-CM | POA: Insufficient documentation

## 2019-06-20 LAB — URINALYSIS
BILIRUBIN UA: NEGATIVE
BLOOD UA: NEGATIVE
GLUCOSE UA: NEGATIVE
NITRITE UA: NEGATIVE
PH UA: 6 (ref 5.0–7.5)
UROBILINOGEN UA: 1 mg/dL (ref 0.2–1.0)

## 2019-06-20 LAB — MICROSCOPIC EXAMINATION: EPITHELIAL CELLS (NON RENAL): >10 /HPF — AB (ref 0–10)

## 2019-06-20 LAB — WBC URINE

## 2019-06-20 LAB — KETONES UA

## 2019-06-20 MED ORDER — CLONIDINE HCL 0.2 MG PO TABS: 0.2000 mg | ORAL_TABLET | Freq: Three times a day (TID) | ORAL | Status: DC

## 2019-06-20 MED ORDER — PREGABALIN 75 MG PO CAPS: 75.0000 mg | ORAL_CAPSULE | Freq: Two times a day (BID) | ORAL | Status: DC

## 2019-06-20 MED ORDER — CARVEDILOL 25 MG PO TABS: 37.5000 mg | ORAL_TABLET | Freq: Two times a day (BID) | ORAL | Status: DC

## 2019-06-20 NOTE — Telephone Encounter (Signed)
Please resch thyroid US not on Monday/Thursday call pt and let her know  Also ask pt if she scheduled her mammogram   Thanks [place]

## 2019-06-20 NOTE — Unmapped (Addendum)
Call patient to check in regarding recent lab results.     Pt. Didn't take Envarsus Monday/Tuesday of last week which can reflects her low tacrolimus trough of 1.6     Pt. Confirms her dose of Envarsus 14 mg daily.     She has been taking her medications as of Wednesday  Will monitor, no changes at this time.     Drue Flirt, RN Transplant Nurse Coordinator 06/20/2019 5:24 PM

## 2019-06-20 NOTE — Unmapped (Signed)
To Do:  - our schedulers will call you to schedule the upper endoscopy  - continue pantoprazole, carafate, lidocaine as you are      It is a pleasure taking care of you!    Hyman Bible, MD  Gastroenterology & Hepatology Fellow  [place] [facility]      Important phone numbers:    Nurse Contact: Lytle Butte, RN  Phone: [phone]   Fax: [phone]    GI Clinic Appointments:  [phone] option 1 then option 1  Please call the GI Clinic appointment line if you need to schedule, reschedule or cancel an appointment in clinic. They can also answer any questions you may have about where your appointment is located and when you need to arrive.      GI Procedure Appointments:  [phone] option 1 then option 2  Please call the GI Procedures line if you need to schedule, reschedule or cancel any type of GI procedure (endoscopy, colonoscopy, motility testing, etc).  You can also call this number for prep instructions, etc.      Radiology: [phone]  If you are being scheduled for any type of radiology study, you will need to call to receive your appointment time.  Please call this number for information.       For emergencies after normal business hours or on weekends/holidays   Proceed to the nearest emergency room OR contact the [facility] Operator at [phone] who can page the Gastroenterology Fellow on call.      Survey  Please complete a survey about your visit today at:  [email or web address]    Or use QR code below to access

## 2019-06-20 NOTE — Unmapped (Signed)
I saw and evaluated the patient, participating in the key portions of the service.  I reviewed the resident???s note.  I agree with the resident???s findings and plan. Vickii Chafe, MD     [facility] PROGRESS NOTE    REFERRING PROVIDER: None Per Patient Pcp  No address on file    PRIMARY CARE PROVIDER: Donnald Garre, MD    PATIENT PROFILE: Michele Cisneros is a 64 y.o. female (DOB: [birth year]) who is seen in consultation at the request of Dr. Oneita Hurt for evaluation of odynophagia      ASSESSMENT:     Michele Cisneros is a 64 y.o. female (DOB: Jun 28, 1955) with PMHx of ESRD 2/2 DM2 s/p renal transplant (03/30/19), GERD, HTN, MI (2014) on ASA who presents for follow up of esophageal ulcers.     Patient initially presented with odynophagia. EGD 05/04/19 with few linear esophageal ulcers. Biopsies with ulcerated squamous mucosa. Negative for HSV1/2, CMV, no granulomas.    Differential for her esophageal ulcers includes reflux, infectious, pill esophagitis. She is certainly at risk for infectious etiologies given her immunosuppression but biopsy results were unrevealing for the most common infections. Unlikely to be Crohns given lack of other symptoms that would be concerning for IBD.     She has responded well to treatment for GERD so certainly could be due to GERD. Will continue PPI BID and carfate for now and repeat EGD to check for healing.       PLAN:     - continue PPI BID  - continue carafate QID   - repeat EGD ordered  - Further GI follow up will be with myself (Dr. Orvan Falconer) in GI fellows clinic (patient was inadvertently scheduled in Hepatology clinic today)    Patient was seen and discussed with Dr. Ruffin Frederick who is in agreement with above assessment and plan.    Time spent with patient was 30 minutes, with > 50% of time spent in review, counseling, education and coordination of care.      Hyman Bible, MD  Gastroenterology & Hepatology Fellow, PGY-4  [facility] SUBJECTIVE:     CHIEF COMPLAINT: odynophagia    HISTORY OF PRESENT ILLNESS:      Michele Cisneros is a 65 y.o. female (DOB: 11/02/1955) with PMHx of ESRD 2/2 DM2 s/p renal transplant (03/30/19), GERD, HTN, MI (2014) on ASA who presents for follow up of esophageal ulcers.     Presented with odynophagia. EGD with few linear esophageal ulcers. Biopsies with ulcerated squamous mucosa. Negative for HSV1/2, CMV, no granulomas. Potential etiologies to consider include, but are not limited to, severe reflux esophagitis, pill-induced esophagitis, infection, and Crohn disease. Correlation with the clinical and endoscopic findings is necessary to determine the most likely etiology.     Since discharge has been on PPI BID, carafate TID and viscous lidocaine TID. She reports her symptoms are much improved. No odynophagia. No dysphagia. No heartburn or reflux symptoms. BMs normal 1-2 per day. No blood in stool. No abdominal pain.      REVIEW OF SYSTEMS:   The balance of 12 systems reviewed is negative except as noted in the HPI.     PAST MEDICAL HISTORY:  Past Medical History:   Diagnosis Date   ??? End stage renal disease (CMS-HCC) 11/08/2012   ??? ESRD on hemodialysis (CMS-HCC) 08/21/2011    Pt gets dialysis with [facility] in [address], [place], on a TTS schedule.  She started dialysis in Feb 2014.  Etiology  of renal failure not known, likely diabetes.  Has a left upper arm AV graft as of Dec 2014.   On transplant list since 2015.  Dr. Cherylann Ratel RN [facility] [phone]  Last Assessment & Plan:  Stable. continues to follow with lateef. On kidney transplant list.   ??? Other and unspecified hyperlipidemia 11/08/2012   ??? Type II or unspecified type diabetes mellitus 11/08/2012   ??? Unspecified essential hypertension 11/08/2012     Patient Active Problem List    Diagnosis Date Noted   ??? Status post deceased-donor kidney transplantation 03/30/2019     Priority: High   ??? Esophageal ulceration 05/04/2019 ??? GERD (gastroesophageal reflux disease) 05/02/2019   ??? Immunocompromised patient (CMS-HCC) 03/30/2019   ??? Dementia (CMS-HCC) 02/28/2019   ??? Pain due to onychomycosis of toenails of both feet 11/18/2018   ??? Allergic rhinitis 09/23/2018   ??? Chronic back pain 01/05/2018   ??? History of heart attack 01/05/2018   ??? Valvular heart disease 01/05/2018   ??? Thyroid nodule 08/11/2017   ??? PAD (peripheral artery disease) (CMS-HCC) 03/07/2017   ??? Bilateral carotid artery stenosis 02/17/2017   ??? Atypical chest pain 05/21/2016   ??? Mitral regurgitation 02/06/2016   ??? OSA on CPAP 12/20/2015   ??? Chronic diastolic heart failure (CMS-HCC) 05/07/2015   ??? Neuritis or radiculitis due to rupture of lumbar intervertebral disc 11/28/2014   ??? Osteoarthritis of right hip 08/23/2013   ??? Coronary artery disease of native artery of native heart with stable angina pectoris (CMS-HCC) 06/27/2013   ??? Diabetic retinopathy (CMS-HCC) 05/28/2013   ??? Syncope 01/21/2013   ??? End stage renal disease (CMS-HCC) 11/08/2012   ??? Essential hypertension 11/08/2012   ??? Hyperlipidemia 11/08/2012   ??? Anemia in chronic kidney disease (CKD) 02/18/2012   ??? Diabetic neuropathy (CMS-HCC) 02/18/2012   ??? Diabetes mellitus, type II (CMS-HCC) 06/30/2011   ??? COPD (chronic obstructive pulmonary disease) (CMS-HCC) 06/13/2011       PAST SURGICAL HISTORY:  Past Surgical History:   Procedure Laterality Date   ??? AV FISTULA PLACEMENT     ??? HYSTERECTOMY  2000   ??? PR TRANSPLANTATION OF KIDNEY Left 03/30/2019    Procedure: RENAL ALLOTRANSPLANTATION, IMPLANTATION OF GRAFT; WITHOUT RECIPIENT NEPHRECTOMY;  Surgeon: Loney Hering, MD;  Location: MAIN [facility];  Service: Transplant   ??? PR UPPER GI ENDOSCOPY,BIOPSY N/A 05/04/2019    Procedure: UGI ENDOSCOPY; WITH BIOPSY, SINGLE OR MULTIPLE;  Surgeon: Jules Husbands, MD;  Location: [facility];  Service: Gastroenterology   ??? TOTAL ABDOMINAL HYSTERECTOMY W/ BILATERAL SALPINGOOPHORECTOMY  2000       MEDICATIONS: Current Outpatient Medications   Medication Sig Dispense Refill   ??? acetaminophen (TYLENOL) 500 MG tablet Take 1-2 tablets (500-1,000 mg total) by mouth every six (6) hours as needed for pain or fever (> 38C or 100.79F). 30 tablet 0   ??? albuterol (PROVENTIL HFA;VENTOLIN HFA) 90 mcg/actuation inhaler Inhale 2 puffs every six (6) hours as needed for wheezing.     ??? amLODIPine (NORVASC) 10 MG tablet Take 1 tablet (10 mg total) by mouth daily. 30 tablet 11   ??? aspirin (ECOTRIN) 81 MG tablet Take 81 mg by mouth daily.     ??? atorvastatin (LIPITOR) 40 MG tablet Take 1 tablet (40 mg total) by mouth daily. 30 tablet 11   ??? blood sugar diagnostic (CONTOUR TEST STRIPS) Strp by Other route two (2) times a day. TEST BLOOD SUGARS 2 TIMES DAILY 60 strip 5   ???  blood-glucose meter (ONETOUCH ULTRA SYSTEM KIT) kit Please dispense for patient to use at home to check blood sugars.     ??? carvediloL (COREG) 12.5 MG tablet Take 3 tablets (37.5 mg total) by mouth Two (2) times a day. 180 tablet 11   ??? cloNIDine HCL (CATAPRES) 0.1 MG tablet Take 2 tablets (0.2 mg total) by mouth every eight (8) hours. 180 tablet 11   ??? ergocalciferol (VITAMIN D2) 1,250 mcg (50,000 unit) capsule Take 1 capsule (50,000 Units total) by mouth once a week. Take for 8 weeks 4 capsule 2   ??? LANCETS,THIN MISC Using at home to test blood sugars twice daily.     ??? lidocaine 2% viscous (XYLOCAINE) 2 % Soln Use 15 mL by Mouth as directed Every four (4) hours. 2000 mL 5   ??? magnesium oxide-Mg AA chelate (MAGNESIUM, AMINO ACID CHELATE,) 133 mg Tab Take 1 tablet by mouth Two (2) times a day. 100 tablet 11   ??? metFORMIN (GLUCOPHAGE XR) 500 MG 24 hr tablet Take 1 tablet (500 mg total) by mouth two (2) times a day. 60 tablet 11   ??? mirtazapine (REMERON) 15 MG tablet Take 1 tablet (15 mg total) by mouth nightly. 90 tablet 3   ??? mycophenolate (MYFORTIC) 360 MG TbEC Take 1 tablet (360 mg total) by mouth two (2) times a day. 60 tablet 11 ??? nitroglycerin (NITROSTAT) 0.4 MG SL tablet Place 0.4 mg under the tongue daily as needed.     ??? pantoprazole (PROTONIX) 40 MG tablet Take 1 tablet (40 mg total) by mouth Two (2) times a day. 60 tablet 11   ??? pregabalin (LYRICA) 75 MG capsule Take 1 capsule (75 mg total) by mouth Two (2) times a day. 60 capsule 3   ??? promethazine (PHENERGAN) 25 MG tablet Take 1 tablet (25 mg total) by mouth every six (6) hours as needed for up to 28 doses. 30 each 3   ??? senna (SENOKOT) 8.6 mg tablet Take 1 tablet by mouth nightly as needed for constipation. 30 tablet 11   ??? sulfamethoxazole-trimethoprim (BACTRIM) 400-80 mg per tablet Take 1 tablet (80 mg of trimethoprim total) by mouth Every Monday, Wednesday, and Friday. 12 tablet 5   ??? tacrolimus (ENVARSUS XR) 1 mg Tb24 extended release tablet Take 2 (1mg ) tablets with 3 (4mg ) tablets every morning to equal 14mg  daily dose 60 tablet 11   ??? tacrolimus (ENVARSUS XR) 4 mg Tb24 extended release tablet Take 3 (4mg ) tablets with 2 (1mg ) tablets every morning to equal 14mg  daily dose 90 tablet 11   ??? valGANciclovir (VALCYTE) 450 mg tablet Take 1 tablet (450 mg total) by mouth daily. 30 tablet 2   ??? sucralfate (CARAFATE) 100 mg/mL suspension Take 10 mL (1 g total) by mouth Every six (6) hours. (Patient not taking: Reported on 06/20/2019) 1200 mL 0     No current facility-administered medications for this visit.        ALLERGIES:  Hydrocodone, Lisinopril, and Perfume    FAMILY HISTORY:  The patient's family history includes Breast cancer in her sister; Cancer in her father; Colon cancer (age of onset: 67) in her sister; Colon cancer (age of onset: 37) in her father; Diabetes in her mother; Heart disease in her mother..    SOCIAL HISTORY:  - denies alcohol, tobacco, drug use    OBJECTIVE:   VITAL SIGNS: BP 129/59  - Pulse 73  - Temp 36.6 ??C (Temporal)  - Resp 18  - Wt 69.9  kg (154 lb 3.2 oz)  - BMI 24.15 kg/m??   Wt Readings from Last 6 Encounters:   06/20/19 69.9 kg (154 lb 3.2 oz) 05/20/19 71.3 kg (157 lb 3 oz)   05/20/19 71.3 kg (157 lb 3.2 oz)   05/06/19 76.3 kg (168 lb 4.8 oz)   04/25/19 74.4 kg (164 lb)   04/25/19 74.4 kg (164 lb)       PHYSICAL EXAM:  Constitutional: No acute distress  HEENT: anicteric sclerae  CV: regular  Lung: normal WOB  Abdomen: soft, normal bowel sounds, non-distended, non-tender. No organomegaly. No ascites.   Perianal/Rectal Exam: no perianal lesions, normal sphincter tone, no masses or tenderness, no stool in rectal vault, no gross blood/melena  Extremities: No edema  Skin: No jaundice  Neuro: Alert, Oriented x 3, No focal deficits. No asterixis.   Mental Status: Thought organized, appropriate affect, pleasantly interactive, not anxious appearing      DIAGNOSTIC STUDIES:  I have reviewed all pertinent diagnostic studies, including:    GI Procedures:    EGD 05/04/2019:  Findings:       Few linear esophageal ulcers with no bleeding and no stigmata of recent        bleeding were found 27 to 39 cm from the incisors. Biopsies were taken        with a cold forceps for histology.       A medium amount of food (residue) was found in the gastric body which        precluded adequate visualization of the gastric fundus.       Localized mildly erythematous mucosa with linear erosion without        bleeding was found in the prepyloric region of the stomach and at the        pylorus. Biopsies were taken with a cold forceps for histology.       The examined duodenum was normal.                                                                                   Impression:            - Esophageal ulcers with no bleeding and no stigmata                          of recent bleeding. Biopsied to rule-out infectious                          etiology.                         - A medium amount of food (residue) in the stomach.                          Precluded complete visualization of the gastric fundus.                         - Erythematous mucosa with linear erosion in the prepyloric region of the stomach and pylorus. Biopsied.                         -  Normal examined duodenum.  Recommendation:        - Return patient to hospital ward for ongoing care.                         - Resume previous diet.                         - Continue present medications including BID PPI.                         - Await pathology results.                         - Further recommendations can be made by the GI                          consult team. Please contact them if necessary.      Final Diagnosis   A: Stomach, biopsy  Antral-type mucosa with changes consistent with reactive gastropathy  No Helicobacter organisms identified by H&E stain  No intestinal metaplasia or dysplasia identified   ??  B: Esophagus, biopsy  Ulcerated squamous mucosa (see comment)  HSV 1/HSV 2 combination immunostain: no definitive positive staining   CMV immunostain: negative  No granulomas, intestinal metaplasia, or dysplasia identified   ??         Radiographic studies:    US Abdomen 05/02/2019:  IMPRESSION:  -Prominent gallbladder without evidence of cholelithiasis or biliary ductal dilatation.  Negative sonographic Murphys sign.    -Atrophic, echogenic native kidneys consistent with chronic medical renal disease.    ??    Laboratory results:  Lab Results   Component Value Date    WBC 3.3 (L) 06/16/2019    HGB 9.4 (L) 06/16/2019    HCT 29.3 (L) 06/16/2019    PLT 209 06/16/2019     Lab Results   Component Value Date    NA 141 06/16/2019    K 3.4 (L) 06/16/2019    CL 106 06/16/2019    CO2 20 06/16/2019    BUN 10 06/16/2019    CREATININE 1.10 (H) 06/16/2019    CALCIUM 10.3 06/16/2019    MG 1.3 (L) 06/16/2019    PHOS 3.0 05/20/2019     Lab Results   Component Value Date    ALKPHOS 88 05/20/2019    BILITOT 0.5 05/20/2019    BILIDIR 0.10 05/20/2019    PROT 6.3 (L) 05/20/2019    ALBUMIN 3.8 05/20/2019    ALT 6 05/20/2019    AST 15 05/20/2019    GGT 11 03/28/2019     Lab Results   Component Value Date PT 13.1 05/01/2019    INR 1.11 05/01/2019    APTT 36.8 05/01/2019

## 2019-06-21 LAB — PROTEIN / CREATININE RATIO, URINE: PROTEIN/CREAT RATIO: 459 mg/g{creat} — ABNORMAL HIGH (ref 0–200)

## 2019-06-21 LAB — PROTEIN/CREAT RATIO: Protein/Creatinine:MRto:Pt:Urine:Qn:: 459 — ABNORMAL HIGH

## 2019-06-21 MED FILL — PREGABALIN 75 MG CAPSULE: ORAL | 30 days supply | Qty: 60 | Fill #1

## 2019-06-21 MED FILL — METFORMIN ER 500 MG TABLET,EXTENDED RELEASE 24 HR: ORAL | 30 days supply | Qty: 60 | Fill #1

## 2019-06-21 MED FILL — PANTOPRAZOLE 40 MG TABLET,DELAYED RELEASE: ORAL | 30 days supply | Qty: 60 | Fill #2

## 2019-06-21 MED FILL — ERGOCALCIFEROL (VITAMIN D2) 1,250 MCG (50,000 UNIT) CAPSULE: ORAL | 28 days supply | Qty: 4 | Fill #1

## 2019-06-21 MED FILL — METFORMIN ER 500 MG TABLET,EXTENDED RELEASE 24 HR: 30 days supply | Qty: 60 | Fill #1 | Status: AC

## 2019-06-21 MED FILL — LIDOCAINE VISCOUS 2 % MUCOSAL SOLUTION: OROMUCOSAL | 23 days supply | Qty: 2000 | Fill #1

## 2019-06-21 MED FILL — ENVARSUS XR 4 MG TABLET,EXTENDED RELEASE: 30 days supply | Qty: 90 | Fill #0

## 2019-06-21 MED FILL — ERGOCALCIFEROL (VITAMIN D2) 1,250 MCG (50,000 UNIT) CAPSULE: 28 days supply | Qty: 4 | Fill #1 | Status: AC

## 2019-06-21 MED FILL — PREGABALIN 75 MG CAPSULE: 30 days supply | Qty: 60 | Fill #1 | Status: AC

## 2019-06-21 MED FILL — AMLODIPINE 10 MG TABLET: ORAL | 30 days supply | Qty: 30 | Fill #1

## 2019-06-21 MED FILL — ENVARSUS XR 4 MG TABLET,EXTENDED RELEASE: 30 days supply | Qty: 90 | Fill #0 | Status: AC

## 2019-06-21 MED FILL — PANTOPRAZOLE 40 MG TABLET,DELAYED RELEASE: 30 days supply | Qty: 60 | Fill #2 | Status: AC

## 2019-06-21 MED FILL — LIDOCAINE VISCOUS 2 % MUCOSAL SOLUTION: 23 days supply | Qty: 2000 | Fill #1 | Status: AC

## 2019-06-21 MED FILL — AMLODIPINE 10 MG TABLET: 30 days supply | Qty: 30 | Fill #1 | Status: AC

## 2019-06-21 MED FILL — ENVARSUS XR 1 MG TABLET,EXTENDED RELEASE: 30 days supply | Qty: 60 | Fill #0 | Status: AC

## 2019-06-22 ENCOUNTER — Telehealth: Payer: Self-pay | Admitting: Internal Medicine

## 2019-06-22 NOTE — Telephone Encounter (Signed)
I called pt and left a vm to call ofc to schedule non fasting labs and 3-6 month follow up.

## 2019-06-23 DIAGNOSIS — Z940 Kidney transplant status: Secondary | ICD-10-CM | POA: Diagnosis not present

## 2019-06-23 DIAGNOSIS — Z79899 Other long term (current) drug therapy: Secondary | ICD-10-CM | POA: Diagnosis not present

## 2019-06-23 NOTE — Unmapped (Signed)
Transplant Nephrology Clinic Visit      History of Present Illness    64 y.o. female here for follow up after kidney transplantation.  Patient has ESRD secondary to DM II.  Patient has been on dialysis since 02/07/2014.  Patient's history includes HTN, CAD, CHF, HLD, COPD, PVD, sickle cell trait, and chronic back pain.     Patient was admitted for kidney transplant on 03/28/2019.  She received 1 unit pRBC on 03/30/2019 and 04/03/2019.  Patient had delayed graft function after transplant, but no dialysis was needed. RUS on 03/30/2019 showed 1.8 cm hypoechoic fluid collection abutting the left lower pole of the transplant kidney likely representing postsurgical seroma.  Patient discharged on 04/06/2019 to her home.       Transplant History:    Organ Received: Left DDKT, DBD, SCD, KDPI: 70%  Resp culture: + strep pneumo  Native Kidney Disease: DM II; cPRA 49%  Date of Transplant: 03/30/2019  Post-Transplant Course: DGF  Prior Transplants: No  Induction: Campath  Date of Ureteral Stent Removal: 06/13/2019  Current Immunosuppression: Tacrolimus/Myfortic  CMV/EBV Status: CMV D-/R+  EBV D+/R+  Rejection Episodes: None  Donor Specific Antibodies: None  Results of Renal Imaging (pre and post):      Pre-Txp 07/07/17  -Kidneys are small with mildly increased echogenicity bilaterally. No solid masses or calculi in the visualized kidneys. No hydronephrosis    CT Abdomen/Pelvis w/o Contrast 03/29/2019 (pre-txp)  -1.2 cm hypodensity in the right kidney lower pole (2:48), increased in size from prior however incompletely characterized given lack of intravenous contrast.  -Uterus is surgically absent.    Post-Txp 05/08/2019 (txp kidney only)  --The renal transplant was located in the left lower quadrant. Normal size and echogenicity.  No solid masses or calculi. No perinephric collections identified. Double-J ureteral stent with proximal tip within the renal pelvis and distal tip within the bladder. Mild caliectasis. No hydronephrosis. --Interval increase in resistive indices within the arcuate, segmental and main renal arteries measuring up to 0.98. Attention on follow-up.  --Improved perfusion in the upper pole of the transplant kidney, now with normal perfusion throughout the renal parenchyma.    Current Immunosuppression Regimen:   Envarsus 14 mg daily  Myfortic 360 mg BID      Subjective/Interval:   Since last visit patient was tested for Covid-19 on 05/20/19 and was negative.  She was started on Ergocalciferol for low vitamin d level.  Myfortic decreased to 360 mg BID on 05/31/19 due to low WBC.  Ureteral stent removed 06/13/19.  Patient saw her PCP on 06/16/19 virtually.  Patient saw GI medicine on 06/20/19.  EGD ordered and to be scheduled.     Patient presents today complaining of pain with urination, urgency, and frequency x 3 days.  She denies any discharge, itching, dysuria, or hematuria. Home BP running 140-170/60-70.  Patient did not get a smaller cuff as requested. She denies any headaches, dizziness, lightheadedness, chest pain, or shortness of breath.  Blood sugars have been running 140-190.  She states her hand tremor has gotten worse.  She is drinking 4 bottles of water a day.  She does state she feels a bit wobbly when walking.  Home PT discharged the patient from their care recently. She is walking at home and just got an exercise bike to work on gaining strength in her legs. She denies any falls at home.     She denies abdominal pain, n/v/d, and edema.         Past  Medical History  1. DM II  2. HTN  3. HLD  4. CAD  5. CHF  6. COPD  7. PVD  8. Sickle Cell Trait  9. DDD with chronic pain    Review of Systems    Otherwise as per HPI, all other systems reviewed and are negative.    Medications  Current Outpatient Medications   Medication Sig Dispense Refill   ??? acetaminophen (TYLENOL) 500 MG tablet Take 1-2 tablets (500-1,000 mg total) by mouth every six (6) hours as needed for pain or fever (> 38C or 100.80F). 30 tablet 0   ??? albuterol (PROVENTIL HFA;VENTOLIN HFA) 90 mcg/actuation inhaler Inhale 2 puffs every six (6) hours as needed for wheezing.     ??? amLODIPine (NORVASC) 10 MG tablet Take 1 tablet (10 mg total) by mouth daily. 30 tablet 11   ??? aspirin (ECOTRIN) 81 MG tablet Take 81 mg by mouth daily.     ??? atorvastatin (LIPITOR) 40 MG tablet Take 1 tablet (40 mg total) by mouth daily. 30 tablet 11   ??? blood sugar diagnostic (CONTOUR TEST STRIPS) Strp by Other route two (2) times a day. TEST BLOOD SUGARS 2 TIMES DAILY 60 strip 5   ??? blood-glucose meter (ONETOUCH ULTRA SYSTEM KIT) kit Please dispense for patient to use at home to check blood sugars.     ??? carvediloL (COREG) 12.5 MG tablet Take 3 tablets (37.5 mg total) by mouth Two (2) times a day. 180 tablet 11   ??? cloNIDine HCL (CATAPRES) 0.1 MG tablet Take 2 tablets (0.2 mg total) by mouth every eight (8) hours. 180 tablet 11   ??? ergocalciferol (VITAMIN D2) 1,250 mcg (50,000 unit) capsule Take 1 capsule (50,000 Units total) by mouth once a week. Take for 8 weeks 4 capsule 2   ??? LANCETS,THIN MISC Using at home to test blood sugars twice daily.     ??? lidocaine 2% viscous (XYLOCAINE) 2 % Soln Use 15 mL by Mouth as directed Every four (4) hours. 2000 mL 5   ??? magnesium oxide (MAGOX) 400 mg (241.3 mg magnesium) tablet Take 1 tablet (400 mg total) by mouth Two (2) times a day. 120 tablet 11   ??? metFORMIN (GLUCOPHAGE XR) 500 MG 24 hr tablet Take 2 tablets in the morning and 1 tablet at night 90 tablet 11   ??? mirtazapine (REMERON) 15 MG tablet Take 1 tablet (15 mg total) by mouth nightly. 90 tablet 3   ??? mycophenolate (MYFORTIC) 180 MG EC tablet Take 2 tablets (360 mg total) by mouth Two (2) times a day. 120 tablet 11   ??? nitrofurantoin, macrocrystal-monohydrate, (MACROBID) 100 MG capsule Take 1 capsule (100 mg total) by mouth every twelve (12) hours. 14 capsule 0   ??? nitroglycerin (NITROSTAT) 0.4 MG SL tablet Place 0.4 mg under the tongue daily as needed.     ??? pantoprazole (PROTONIX) 40 MG tablet Take 1 tablet (40 mg total) by mouth Two (2) times a day. 60 tablet 11   ??? pregabalin (LYRICA) 75 MG capsule Take 1 capsule (75 mg total) by mouth Two (2) times a day. 60 capsule 3   ??? promethazine (PHENERGAN) 25 MG tablet Take 1 tablet (25 mg total) by mouth every six (6) hours as needed for up to 28 doses. 30 each 3   ??? sucralfate (CARAFATE) 100 mg/mL suspension Take 10 mL (1 g total) by mouth Every six (6) hours. (Patient not taking: Reported on 06/20/2019) 1200 mL 0   ???  sulfamethoxazole-trimethoprim (BACTRIM) 400-80 mg per tablet Take 1 tablet (80 mg of trimethoprim total) by mouth Every Monday, Wednesday, and Friday. 12 tablet 5   ??? tacrolimus (ENVARSUS XR) 1 mg Tb24 extended release tablet Take 2 (1mg ) tablets with 3 (4mg ) tablets every morning to equal 14mg  daily dose 60 tablet 11   ??? tacrolimus (ENVARSUS XR) 4 mg Tb24 extended release tablet Take 3 (4mg ) tablets with 2 (1mg ) tablets every morning to equal 14mg  daily dose 90 tablet 11   ??? valGANciclovir (VALCYTE) 450 mg tablet Take 1 tablet (450 mg total) by mouth daily. 30 tablet 2     No current facility-administered medications for this visit.            Physical Exam  BP 144/58 (BP Site: R Arm, BP Position: Sitting, BP Cuff Size: Medium)  - Pulse 79  - Temp 35.9 ??C (96.6 ??F) (Temporal)  - Ht 171.5 cm (5' 7.5)  - Wt 69.8 kg (153 lb 12.8 oz)  - BMI 23.73 kg/m??   General: no acute distress  HEENT: wearing mask, PERRL  Neck: neck supple, no cervical lymphadenopathy appreciated  CV: normal rate, normal rhythm, no murmur, no gallops, no rubs appreciated  Lungs: clear to auscultation bilaterally  Abdomen: soft, non-tender over graft site;  Incision healed, c/d/i; no drainage or odor noted; no erythema;   Extremities: no edema  Musculoskeletal: no visible deformity, normal range of motion.  Pulses: intact distally throughout  Neurologic: awake, alert, and oriented x3      Laboratory Data and Imaging reviewed in EPIC      Assessment:  64 y.o. female status post deceased donor kidney transplant on 03/30/2019 for ESRD secondary to DM II who presents for routine follow up and post-transplant care.         Recommendations/Plan:     Labs from 06/23/19; Urine collected 06/24/19    Allograft Function: Renal function stable at 0.92 with a baseline of 1.1-1.3 since transplant.   No DSAs as of 05/20/19.  UPC normal at 0.082.      Immunosuppression Management [High Risk Medical Decision Making For Drug Therapy Requiring Intensive Monitoring For Toxicity]: Tacrolimus trough level pending from 06/23/19. Tacrolimus 14 mg daily.  Targeting tacrolimus trough levels of approximately 8-10 ng/mL.   Continue mycophenolate 360 mg BID due to low WBC.  Will discuss possible switch to cyclosporine at next visit.     Blood Pressure Management: BP 144/58 at this visit. Continue current regimen. Will continue to monitor BP.  Patient reminded to get a smaller cuff.     Lipid Management: Last lipid panel on 05/20/19.  Continue atorvastatin. The ASCVD Risk score Denman George DC Jorge Ny al., 2013) failed to calculate.    Electrolytes: Electrolyte and metabolic parameters in acceptable range.  Will continue to monitor.  Mag 1.0 today. Patient to start magnesium supplement as she didn't start it after last visit.  Vitamin D 6.4.  Continue on Ergocalciferol.      Infectious Prophylaxis and Monitoring: CMV VL not detected 05/20/19, will get with next visit labs.  BK VL not detected 05/20/2019.  Will continue to monitor.   The patient continues on Valcyte (end 06/29/19) and Bactrim (end 09/28/19) prophylaxis.   Hep C antibody came back positive on 03/28/19.  HCV Ab grayzone 04/25/2019 with HCV VL not detected same day.    Pulmonary Nodule: Seen on NM Stress 07/07/17.  Subcentimeter nodule is stable from previous study 03/18/16 and 03/07/14    Lumbar Radiculopathy: Following  with [facility] with next appt 04/26/2019 with MRI of spine.  Patient states she is going to change it to a virtual appointment.  Chronic low back pain with numbness to BLE at time.  Sometimes Right foot drags as well.  Plan is for surgery in the future for Lumbosacral DDD.     Right Breast Soft Tissue Mass: no size change since 03/18/2016.  F/U due 2020.     Neuropathy: Continue on Lyrica.  Follows with [facility], last appt 03/17/2019.     Thyroid Nodule: [place] 06/16/17 showed right upper thyroid nodule.  Annual [place] needed.  Thyroid US ordered by PCP on 06/16/19.      LLQ Pain: Resolved with Ditropan.     GERD: Protonix to BID.     DM II: Metformin 500mg  BID.  Last Hgb A1C 4.9 on 05/20/19.    Esophageal/Stomach Ulcers: Protonix BID, Lidocaine solution, Carafate.  Saw GI on 06/20/19 who stated to continue Carafate and protonix.  EGD scheduled for 07/05/19.    UTI: Macrobid 100mg  BID x 7 days prescribed.       Health Maintenance:   Mammo: 03/04/18; f/u 1 year  Pap Smear: Hysterectomy  Colonoscopy: 08/20/2017; repeat 5 years due to multiple tubular adenomas    Immunizations:   Flu Shot: 03/18/2019  Prevnar 13: not noted in records from 2012 and after.  Will get after 1 year post transplant  Pneumovax: 03/23/18    Counseling:  I counseled the patient on:  The need to avoid sun exposure and the use of sunblock while outdoors given the relatively higher risk of skin malignancy in an immunosuppressed state.  The need for adherence to immunosuppression medication.  Patient verbalized understanding.     Follow-Up:  Return to clinic 4 weeks  Labs: Weekly if tac level is stable next week.    Patient will continue to follow-up with his primary care provider for non-transplant related issues and medication refills. We have ordered transplant specific labs per the center's guidelines to monitor and assess for toxicities from immunosuppressant drug therapy.        Jonae Renshaw L. Marina Goodell, MSN, APRN, AGNP-C - Kidney Transplant Nurse Practitioner  [facility]

## 2019-06-23 NOTE — Unmapped (Signed)
06/23/2019  VisR Study & Covid-19 Survey Consents     Approached the pt re: participation in the VisR Renal US study- IRB [phone] via telephone contact due to ongoing Covid-19 restrictions & per [facility] OVCR guidelines.     The?study?protocol was reviewed?including risks/benefits of the study, inclusion/exclusion criteria, financial responsibilities (there are none), length of participation (ultrasound only at routine f/u kidney txp clinic visits), right to refuse or w/draw at any time, research study is voluntary, and the maintaining of personal privacy &?confidentiality according to HIPPA consent. ?Pt verbalized understanding and agreed to participate.      ?HIPPA & the Covid-19 survey consents were reviewed as well with the pt. Pt. verbalized: no personal contact with others who have tested + for Covid-19 w/in past 14 days, not been out of the state, exhibits no SXs of the virus (loss of taste & smell), flu-like sx, muscle aches, fever, etc. Pt was informed that study personnel would follow all the required Covid-19 restrictions per [facility] protocol to avoid potential exposure of the virus to the pt. Pt. Verbalized understanding and agreed to participate.     Study, HIPPA, & Covid-19 consents will be given to the pt by Rachell Cipro, PhD sonographer, prior to the time of the study Ultrasound tomorrow, 06/24/2019, for signatures  & for distribution of the consents to the pt., Epic, & study files.    Garen Lah, BSN RN   Research Study Coordinator   Independent [employer]or   [place] [facility]   [email or web address]   [phone] Judie Petit)

## 2019-06-24 ENCOUNTER — Encounter: Admit: 2019-06-24 | Discharge: 2019-06-24 | Payer: MEDICARE

## 2019-06-24 DIAGNOSIS — Z794 Long term (current) use of insulin: Principal | ICD-10-CM

## 2019-06-24 DIAGNOSIS — E1122 Type 2 diabetes mellitus with diabetic chronic kidney disease: Secondary | ICD-10-CM

## 2019-06-24 DIAGNOSIS — Z940 Kidney transplant status: Principal | ICD-10-CM

## 2019-06-24 DIAGNOSIS — Z79899 Other long term (current) drug therapy: Principal | ICD-10-CM

## 2019-06-24 DIAGNOSIS — D899 Disorder involving the immune mechanism, unspecified: Principal | ICD-10-CM

## 2019-06-24 DIAGNOSIS — N3000 Acute cystitis without hematuria: Secondary | ICD-10-CM | POA: Diagnosis not present

## 2019-06-24 DIAGNOSIS — Z6823 Body mass index (BMI) 23.0-23.9, adult: Secondary | ICD-10-CM | POA: Diagnosis not present

## 2019-06-24 DIAGNOSIS — D849 Immunodeficiency, unspecified: Secondary | ICD-10-CM | POA: Diagnosis not present

## 2019-06-24 LAB — CBC W/ DIFFERENTIAL
BASOPHILS ABSOLUTE COUNT: 0 10*3/uL (ref 0.0–0.2)
BASOPHILS RELATIVE PERCENT: 2 %
EOSINOPHILS ABSOLUTE COUNT: 0.1 10*3/uL (ref 0.0–0.4)
HEMATOCRIT: 31.7 % — ABNORMAL LOW (ref 34.0–46.6)
HEMOGLOBIN: 10.4 g/dL — ABNORMAL LOW (ref 11.1–15.9)
LYMPHOCYTES ABSOLUTE COUNT: 0.1 10*3/uL — ABNORMAL LOW (ref 0.7–3.1)
LYMPHOCYTES RELATIVE PERCENT: 7 %
MEAN CORPUSCULAR HEMOGLOBIN CONC: 32.8 g/dL (ref 31.5–35.7)
MEAN CORPUSCULAR HEMOGLOBIN: 30.4 pg (ref 26.6–33.0)
MONOCYTES ABSOLUTE COUNT: 0.1 10*3/uL (ref 0.1–0.9)
NEUTROPHILS ABSOLUTE COUNT: 1.3 10*3/uL — ABNORMAL LOW (ref 1.4–7.0)
NEUTROPHILS RELATIVE PERCENT: 79 %
PLATELET COUNT: 191 10*3/uL (ref 150–450)
RED BLOOD CELL COUNT: 3.42 x10E6/uL — ABNORMAL LOW (ref 3.77–5.28)
RED CELL DISTRIBUTION WIDTH: 16.5 % — ABNORMAL HIGH (ref 11.7–15.4)
WHITE BLOOD CELL COUNT: 1.7 10*3/uL — CL (ref 3.4–10.8)

## 2019-06-24 LAB — RENAL FUNCTION PANEL
CALCIUM: 10.1 mg/dL (ref 8.7–10.3)
CHLORIDE: 104 mmol/L (ref 96–106)
CO2: 19 mmol/L — ABNORMAL LOW (ref 20–29)
CREATININE: 0.92 mg/dL (ref 0.57–1.00)
GFR MDRD AF AMER: 78 mL/min/{1.73_m2}
GFR MDRD NON AF AMER: 67 mL/min/{1.73_m2}
GLUCOSE: 132 mg/dL — ABNORMAL HIGH (ref 65–99)
PHOSPHORUS, SERUM: 3 mg/dL (ref 3.0–4.3)
POTASSIUM: 3.3 mmol/L — ABNORMAL LOW (ref 3.5–5.2)
SODIUM: 141 mmol/L (ref 134–144)

## 2019-06-24 LAB — UROBILINOGEN UA: Lab: 0.2

## 2019-06-24 LAB — URINALYSIS
BILIRUBIN UA: NEGATIVE
BLOOD UA: NEGATIVE
GLUCOSE UA: NEGATIVE
HYALINE CASTS: 11 /LPF — ABNORMAL HIGH (ref 0–1)
KETONES UA: NEGATIVE
NITRITE UA: NEGATIVE
PH UA: 6 (ref 5.0–9.0)
PROTEIN UA: NEGATIVE
RBC UA: 2 /HPF (ref <=4)
SPECIFIC GRAVITY UA: 1.016 (ref 1.003–1.030)
SQUAMOUS EPITHELIAL: 7 /HPF — ABNORMAL HIGH (ref 0–5)

## 2019-06-24 LAB — MAGNESIUM: Magnesium:MCnc:Pt:Ser/Plas:Qn:: 1 — ABNORMAL LOW

## 2019-06-24 LAB — CREATININE: Creatinine:MCnc:Pt:Ser/Plas:Qn:: 0.92

## 2019-06-24 LAB — PROTEIN / CREATININE RATIO, URINE: PROTEIN/CREAT RATIO, URINE: 0.082

## 2019-06-24 LAB — PROTEIN/CREAT RATIO, URINE: Protein/Creatinine:MRto:Pt:Urine:Qn:: 0.082

## 2019-06-24 LAB — EOSINOPHILS RELATIVE PERCENT: Eosinophils/100 leukocytes:NFr:Pt:Bld:Qn:Automated count: 8

## 2019-06-24 MED ORDER — MYCOPHENOLATE SODIUM 180 MG TABLET,DELAYED RELEASE
ORAL_TABLET | Freq: Two times a day (BID) | ORAL | 11 refills | 30 days | Status: CP
Start: 2019-06-24 — End: 2020-06-23
  Filled 2019-06-24: qty 120, 30d supply, fill #0

## 2019-06-24 MED ORDER — MAGNESIUM OXIDE 400 MG (241.3 MG MAGNESIUM) TABLET: 400 mg | tablet | Freq: Every day | 11 refills | 100 days | Status: AC

## 2019-06-24 MED ORDER — METFORMIN ER 500 MG TABLET,EXTENDED RELEASE 24 HR
ORAL_TABLET | 11 refills | 0 days | Status: CP
Start: 2019-06-24 — End: ?
  Filled 2019-07-19: qty 90, 30d supply, fill #0

## 2019-06-24 MED ORDER — NITROFURANTOIN MONOHYDRATE/MACROCRYSTALS 100 MG CAPSULE
ORAL_CAPSULE | Freq: Two times a day (BID) | ORAL | 0 refills | 7.00000 days | Status: CP
Start: 2019-06-24 — End: ?
  Filled 2019-06-24: qty 14, 7d supply, fill #0

## 2019-06-24 MED ORDER — MAGNESIUM OXIDE 400 MG (241.3 MG MAGNESIUM) TABLET
ORAL_TABLET | Freq: Two times a day (BID) | ORAL | 11 refills | 60.00000 days | Status: CP
Start: 2019-06-24 — End: 2020-06-23
  Filled 2019-06-24: qty 120, 60d supply, fill #0

## 2019-06-24 MED FILL — MYCOPHENOLATE SODIUM 180 MG TABLET,DELAYED RELEASE: 30 days supply | Qty: 120 | Fill #0 | Status: AC

## 2019-06-24 MED FILL — NITROFURANTOIN MONOHYDRATE/MACROCRYSTALS 100 MG CAPSULE: 7 days supply | Qty: 14 | Fill #0 | Status: AC

## 2019-06-24 MED FILL — MAGNESIUM OXIDE 400 MG (241.3 MG MAGNESIUM) TABLET: 60 days supply | Qty: 120 | Fill #0 | Status: AC

## 2019-06-24 NOTE — Unmapped (Signed)
[facility] TR[facility] PHARMACY NOTE  06/24/2019   Michele Cisneros  [id number]    Medication changes today:   1. Start mag ox 400 mg BID  2. Increase metformin XR to 1g qAM and 500 mg qPM  3. Start Macrobid 100 mg BID x7 days     Education/Adherence tools provided today:  1.provided updated medication list  2. provided additional pill box education  3.  provided additional education on immunosuppression and transplant related medications including reviewing indications of medications, dosing and side effects    Follow up items:  1. goal of understanding indications and dosing of immunosuppression medications  2. Consider switching to CSA  3. Ergocalciferol at next visit  4. WBC and if need to decrease Myfortic  5. CMV    Next visit with pharmacy in 1 month  ____________________________________________________________________    Michele Cisneros is a 64 y.o. female s/p deceased kidney transplant on 04/14/2019 (Kidney) 2/2 T2DM.     Other PMH significant for hypertension, T2DM, CAD s/p PCI     Seen by pharmacy today for: medication management and pill box fill and adherence education; last seen by pharmacy 4 weeks ago     Hospitalization 11/22-12/1 for nausea, vomiting, and chest pain.  EGD was performed which showed severe esophageal ulcerations.      CC:  Patient complains of  pain with urination for 3 days, legs are weak since November hospitalization    There were no vitals filed for this visit.    Allergies   Allergen Reactions   ??? Hydrocodone Hives   ??? Lisinopril      Unknown reaction    ??? Perfume        All medications reviewed and updated.     Medication list includes revisions made during today???s encounter    Outpatient Encounter Medications as of 06/24/2019   Medication Sig Dispense Refill   ??? acetaminophen (TYLENOL) 500 MG tablet Take 1-2 tablets (500-1,000 mg total) by mouth every six (6) hours as needed for pain or fever (> 38C or 100.53F). 30 tablet 0   ??? albuterol (PROVENTIL HFA;VENTOLIN HFA) 90 mcg/actuation inhaler Inhale 2 puffs every six (6) hours as needed for wheezing.     ??? amLODIPine (NORVASC) 10 MG tablet Take 1 tablet (10 mg total) by mouth daily. 30 tablet 11   ??? aspirin (ECOTRIN) 81 MG tablet Take 81 mg by mouth daily.     ??? atorvastatin (LIPITOR) 40 MG tablet Take 1 tablet (40 mg total) by mouth daily. 30 tablet 11   ??? blood sugar diagnostic (CONTOUR TEST STRIPS) Strp by Other route two (2) times a day. TEST BLOOD SUGARS 2 TIMES DAILY 60 strip 5   ??? blood-glucose meter (ONETOUCH ULTRA SYSTEM KIT) kit Please dispense for patient to use at home to check blood sugars.     ??? carvediloL (COREG) 12.5 MG tablet Take 3 tablets (37.5 mg total) by mouth Two (2) times a day. 180 tablet 11   ??? cloNIDine HCL (CATAPRES) 0.1 MG tablet Take 2 tablets (0.2 mg total) by mouth every eight (8) hours. 180 tablet 11   ??? ergocalciferol (VITAMIN D2) 1,250 mcg (50,000 unit) capsule Take 1 capsule (50,000 Units total) by mouth once a week. Take for 8 weeks 4 capsule 2   ??? LANCETS,THIN MISC Using at home to test blood sugars twice daily.     ??? lidocaine 2% viscous (XYLOCAINE) 2 % Soln Use 15 mL by Mouth as directed Every  four (4) hours. 2000 mL 5   ??? magnesium oxide-Mg AA chelate (MAGNESIUM, AMINO ACID CHELATE,) 133 mg Tab Take 1 tablet by mouth Two (2) times a day. 100 tablet 11   ??? metFORMIN (GLUCOPHAGE XR) 500 MG 24 hr tablet Take 1 tablet (500 mg total) by mouth two (2) times a day. 60 tablet 11   ??? [EXPIRED] metoclopramide (REGLAN) 5 MG tablet Take 1 tablet (5 mg total) by mouth Three (3) times a day before meals. 90 tablet 0   ??? mirtazapine (REMERON) 15 MG tablet Take 1 tablet (15 mg total) by mouth nightly. 90 tablet 3   ??? mycophenolate (MYFORTIC) 360 MG TbEC Take 1 tablet (360 mg total) by mouth two (2) times a day. 60 tablet 11   ??? nitroglycerin (NITROSTAT) 0.4 MG SL tablet Place 0.4 mg under the tongue daily as needed.     ??? pantoprazole (PROTONIX) 40 MG tablet Take 1 tablet (40 mg total) by mouth Two (2) times a day. 60 tablet 11   ??? [EXPIRED] polyethylene glycol (GLYCOLAX) 17 gram/dose powder Mix 1 capful (17 g) in 4 to 8 ounces water, juice, soda, coffee, or tea and drink daily as needed. 510 g 0   ??? pregabalin (LYRICA) 75 MG capsule Take 1 capsule (75 mg total) by mouth Two (2) times a day. 60 capsule 3   ??? promethazine (PHENERGAN) 25 MG tablet Take 1 tablet (25 mg total) by mouth every six (6) hours as needed for up to 28 doses. 30 each 3   ??? senna (SENOKOT) 8.6 mg tablet Take 1 tablet by mouth nightly as needed for constipation. 30 tablet 11   ??? sucralfate (CARAFATE) 100 mg/mL suspension Take 10 mL (1 g total) by mouth Every six (6) hours. (Patient not taking: Reported on 06/20/2019) 1200 mL 0   ??? sulfamethoxazole-trimethoprim (BACTRIM) 400-80 mg per tablet Take 1 tablet (80 mg of trimethoprim total) by mouth Every Monday, Wednesday, and Friday. 12 tablet 5   ??? tacrolimus (ENVARSUS XR) 1 mg Tb24 extended release tablet Take 2 (1mg ) tablets with 3 (4mg ) tablets every morning to equal 14mg  daily dose 60 tablet 11   ??? tacrolimus (ENVARSUS XR) 4 mg Tb24 extended release tablet Take 3 (4mg ) tablets with 2 (1mg ) tablets every morning to equal 14mg  daily dose 90 tablet 11   ??? valGANciclovir (VALCYTE) 450 mg tablet Take 1 tablet (450 mg total) by mouth daily. 30 tablet 2     No facility-administered encounter medications on file as of 06/24/2019.      Induction agent : alemtuzumab    CURRENT IMMUNOSUPPRESSION: Envarsus 14 mg PO qd   prograf/Envarsus/cyclosporine goal: 8-10   myfortic360  mg PO bid (decreased 12/22 due to leukopenia)   steroid free     Patient complains of significant tremor, is leukopenic    IMMUNOSUPPRESSION DRUG LEVELS:  Lab Results   Component Value Date    Tacrolimus, Trough 4.7 (L) 05/20/2019    Tacrolimus, Trough 5.7 05/10/2019    Tacrolimus, Trough 6.5 05/09/2019    Tacrolimus Lvl 1.6 (L) 06/16/2019    Tacrolimus Lvl 6.4 06/13/2019    Tacrolimus Lvl 5.3 06/06/2019     No results found for: CYCLO  No results found for: EVEROLIMUS  No results found for: SIROLIMUS    Envarsus level is accurate 24 hour trough    Graft function: stable  DSA: ntd  Biopsies to date: zero hour biopsy - mild acute tubular injury  UPC: 0.258 (05/20/19)  WBC/ANC:  1.7/1.3     Plan: Will maintain current immunosuppression and check CMV for leukopenia.  Consider switching Envarsus to CSA for tremor at next visit if it doesn't improve.Continue to monitor.    OI Prophylaxis:   CMV Status: D-/ R+, moderate risk. CMV prophylaxis: valganciclovir 450 mg daily x 3 months per protocol.  Estimated Creatinine Clearance: 63.7 mL/min (based on SCr of 0.92 mg/dL).  No results found for: CMVCP  PCP Prophylaxis: bactrim SS 1 tab MWF x 6 months.  Thrush: completed in hospital  Patient is  tolerating infectious prophylaxis well    Plan: Continue to monitor.    CV Prophylaxis: asa 81 mg   The ASCVD Risk score Denman George DC Montez Hageman, et al., 2013) failed to calculate.  Statin therapy: Indicated given DM+ CAD history; atorvastatin 40 mg daily  Plan: Continue to monitor     BP: Goal < 140/90. Clinic vitals reported above  Home BP ranges: home BP cuff likely inaccurate (180s systolic on home cuff but PT systolics are 130)  Current meds include: amlodipine 10 mg, carvedilol 37.5 mg BID, clonidine 0.2 mg TID  Plan: within goal.  Continue to monitor    Anemia of CKD:  H/H:   Lab Results   Component Value Date    HGB 10.4 (L) 06/23/2019     Lab Results   Component Value Date    HCT 31.7 (L) 06/23/2019     Iron panel:  No results found for: IRON, TIBC, FERRITIN  No results found for: LABIRON    Prior ESA use: none post transplant    Plan: stable. Continue to monitor.     DM:   Lab Results   Component Value Date    A1C 4.9 05/20/2019   . Goal A1c < 7  History of Dm? Yes: T2DM  Established with endocrinologist/PCP for BG managment? Yes: PCP  Currently on: metformin XR 500 mg BID   BG log: FBG 140-160s, PM BG 160s  Diet:did not address  Exercise:not yet  Hypoglycemia: no  Plan: Increase metformin XR to 1000 mg in AM and 500 mg PM.  Eventually may benefit from GLP 1    Electrolytes: mag 1.0  Meds currently on: none (had not started magnesium after last visit)  Plan: Start mg ox 400 m g BID.  Continue to monitor     GI/BM: pt reports that she vomited twice yesterday and attributes to running out of lidocaine, has intermittent nausea, normal BM, denies pain with swallowing  Meds currently on: pantoprazole 40 mg BID, Reglan 5 mg TID AC, lidocaine solution 15 mg q4h (out since yesterday)  senna PRN, promethazine 25 mg q6h PRN (in pill box qAM), Carafate 10 mg QID  Plan: Continue to monitor    Pain: pt reports moderate pain, chronic back pain still present but neuropathy improving  Meds currently on: APAP PRN,  pregabalin 75 mg BID  Plan:Continue to monitor    Bone health:   Vitamin D Level: 8.4 on 04/11/19. Goal > 30.   Last DEXA results:  none available  Current meds include: none  Plan: Vitamin D level  out of goal.  At next visit start ergocalciferol.  Continue to monitor.     Women's/Men's Health:  Michele Cisneros is a 64 y.o. Female perimenopausal. Patient reports no men's/women's health issues  Plan: Continue to monitor    Appetite stimulant:  Meds currently on: mirtazapine 15 mg HS  Plan: continue to monitor    Pharmacy preference:  [facility]    Adherence: Patient has poor understanding of medications; was not able to independently identify names/doses of immunosuppressants and OI meds.  Patient  does fill their own pill box on a regular basis at home.  Patient brought medication card:yes  Pill box:was correct   Plan: Encouraged patient to familiarize herself with meds; provided extensive adherence counseling/intervention    Spent approximately 40 minutes on educating this patient and greater than 50% was spent in direct face to face counseling regarding post transplant medication education. Questions and concerns were address to patient's satisfaction.    Patient was reviewed with [name], AGNP who was agreement with the stated plan:     During this visit, the following was completed:   BG log data assessment  BP log data assessment  Labs ordered and evaluated  complex treatment plan >1 DS   Patient education was completed for 11-24 minutes     All questions/concerns were addressed to the patient's satisfaction.  __________________________________________  Cleone Slim, PHARMD, CPP  [employer] TRANSPLANT CLINICAL PHARMACIST PRACTITIONER  PAGER [phone]

## 2019-06-24 NOTE — Unmapped (Signed)
AOBP   Patient's blood pressure was taken on right upper arm, medium cuff.   1st reading 147/60, pulse: 80  2nd reading 142/57, pulse: 79  3rd reading 143/56, pulse: 78  Average reading 144/58, pulse: 79

## 2019-06-24 NOTE — Unmapped (Signed)
AOBP   Patient's blood pressure was taken on right upper arm, medium cuff.   1st reading 147/60, pulse: 80  2nd reading 142/57, pulse: 79  3rd reading 143/56, pulse: 78  Average reading 144/58, pulse: 78

## 2019-06-25 LAB — TACROLIMUS LEVEL: TACROLIMUS BLOOD: 17.5 ng/mL (ref 2.0–20.0)

## 2019-06-25 LAB — TACROLIMUS BLOOD: Tacrolimus:MCnc:Pt:Bld:Qn:LC/MS/MS: 17.5

## 2019-06-27 DIAGNOSIS — Z940 Kidney transplant status: Principal | ICD-10-CM

## 2019-06-30 DIAGNOSIS — Z940 Kidney transplant status: Secondary | ICD-10-CM | POA: Diagnosis not present

## 2019-07-02 ENCOUNTER — Ambulatory Visit: Admit: 2019-07-02 | Discharge: 2019-07-03 | Payer: MEDICARE

## 2019-07-02 DIAGNOSIS — Z20822 Contact with and (suspected) exposure to covid-19: Secondary | ICD-10-CM | POA: Diagnosis not present

## 2019-07-02 DIAGNOSIS — Z01812 Encounter for preprocedural laboratory examination: Secondary | ICD-10-CM | POA: Diagnosis not present

## 2019-07-02 NOTE — Unmapped (Signed)
COVID Pre-Procedure Intake Form     Assessment     Michele Cisneros is a 64 y.o. female presenting to [facility] for COVID testing.     Plan     If no testing performed, pt counseled on routine care for respiratory illness.  If testing performed, COVID sent.  Patient directed to Home given findings during today's visit.    Subjective     Michele Cisneros is a 64 y.o. female who [facility] the [facility] with complaints of the following:    Exposure History: In the last 21 days?     Have you traveled outside of [place]? No               Have you been in close contact with someone confirmed by a test to have COVID? (Close contact is within 6 feet for at least 10 minutes) No       Have you worked in a health care facility? No     Lived or worked facility like a nursing home, group home, or assisted living?    No         Are you scheduled to have surgery or a procedure in the next 3 days? Yes               Are you scheduled to receive cancer chemotherapy within the next 7 days?    No     Have you ever been tested before for COVID-19 with a swab of your nose? No   Are you a healthcare worker being tested so to return to work No     Right now,  do you have any of the following that developed over the past 7 days (as stated by patient on intake form):    Subjective fever (felt feverish) No   Chills (especially repeated shaking chills) No   Severe fatigue (felt very tired) No   Muscle aches No   Runny nose No   Sore throat No   Loss of taste or smell No   Cough (new onset or worsening of chronic cough) No   Shortness of breath No   Nausea or vomiting No   Headache No   Abdominal Pain No   Diarrhea (3 or more loose stools in last 24 hours) No       Scribe's Attestation: Levell July. Antar Milks, FNP, obtained and performed the history, physical exam and medical decision making elements that were entered into the chart.  Signed by Teola Bradley serving as Scribe, on 07/02/2019 10:10 AM      The documentation recorded by the scribe accurately reflects the service I personally performed and the decisions made by me. Vedia Coffer, FNP  July 02, 2019 10:15 AM

## 2019-07-05 ENCOUNTER — Ambulatory Visit: Admit: 2019-07-05 | Discharge: 2019-07-09 | Payer: MEDICARE

## 2019-07-05 ENCOUNTER — Encounter
Admit: 2019-07-05 | Discharge: 2019-07-09 | Payer: MEDICARE | Attending: Certified Registered" | Primary: Certified Registered"

## 2019-07-05 DIAGNOSIS — E1151 Type 2 diabetes mellitus with diabetic peripheral angiopathy without gangrene: Secondary | ICD-10-CM | POA: Diagnosis not present

## 2019-07-05 DIAGNOSIS — J449 Chronic obstructive pulmonary disease, unspecified: Secondary | ICD-10-CM | POA: Diagnosis not present

## 2019-07-05 DIAGNOSIS — Z87891 Personal history of nicotine dependence: Secondary | ICD-10-CM | POA: Diagnosis not present

## 2019-07-05 DIAGNOSIS — E1122 Type 2 diabetes mellitus with diabetic chronic kidney disease: Secondary | ICD-10-CM | POA: Diagnosis not present

## 2019-07-05 DIAGNOSIS — Z940 Kidney transplant status: Secondary | ICD-10-CM | POA: Diagnosis not present

## 2019-07-05 DIAGNOSIS — K2100 Gastro-esophageal reflux disease with esophagitis, without bleeding: Secondary | ICD-10-CM | POA: Diagnosis not present

## 2019-07-05 DIAGNOSIS — I132 Hypertensive heart and chronic kidney disease with heart failure and with stage 5 chronic kidney disease, or end stage renal disease: Secondary | ICD-10-CM | POA: Diagnosis not present

## 2019-07-05 DIAGNOSIS — Z7984 Long term (current) use of oral hypoglycemic drugs: Secondary | ICD-10-CM | POA: Diagnosis not present

## 2019-07-05 DIAGNOSIS — K222 Esophageal obstruction: Secondary | ICD-10-CM | POA: Diagnosis not present

## 2019-07-05 DIAGNOSIS — E1140 Type 2 diabetes mellitus with diabetic neuropathy, unspecified: Secondary | ICD-10-CM | POA: Diagnosis not present

## 2019-07-05 DIAGNOSIS — I5032 Chronic diastolic (congestive) heart failure: Secondary | ICD-10-CM | POA: Diagnosis not present

## 2019-07-05 DIAGNOSIS — G4733 Obstructive sleep apnea (adult) (pediatric): Secondary | ICD-10-CM | POA: Diagnosis not present

## 2019-07-05 DIAGNOSIS — I252 Old myocardial infarction: Secondary | ICD-10-CM | POA: Diagnosis not present

## 2019-07-05 DIAGNOSIS — E785 Hyperlipidemia, unspecified: Secondary | ICD-10-CM | POA: Diagnosis not present

## 2019-07-05 DIAGNOSIS — Z66 Do not resuscitate: Secondary | ICD-10-CM | POA: Diagnosis not present

## 2019-07-05 DIAGNOSIS — F0390 Unspecified dementia without behavioral disturbance: Secondary | ICD-10-CM | POA: Diagnosis not present

## 2019-07-05 DIAGNOSIS — N186 End stage renal disease: Secondary | ICD-10-CM | POA: Diagnosis not present

## 2019-07-05 DIAGNOSIS — M1611 Unilateral primary osteoarthritis, right hip: Secondary | ICD-10-CM | POA: Diagnosis not present

## 2019-07-05 LAB — CMV DNA, QUANTITATIVE, PCR: CMV QUANT: NEGATIVE [IU]/mL

## 2019-07-05 LAB — CMV QUANT: Cytomegalovirus DNA:ACnc:Pt:Plas:Qn:Probe.amp.tar: NEGATIVE

## 2019-07-05 MED ORDER — PANTOPRAZOLE 40 MG TABLET,DELAYED RELEASE
ORAL_TABLET | Freq: Every day | ORAL | 3 refills | 90.00000 days | Status: CP
Start: 2019-07-05 — End: 2020-07-04

## 2019-07-05 NOTE — Unmapped (Signed)
Upper endoscopy with Council Mechanic, MD. Gaspar Bidding dilation 18mm. No complications intraprocedure. Report given to Terri,RN in PACU.

## 2019-07-07 NOTE — Unmapped (Signed)
Patient called this morning and says she fell last night and this morning.Denies any injuries. No LOC  Made sure she was using her cane and instructed her to get up only with assistance.  Patient states she is unable to get labs today due to weather.    States she thinks her urine is still red and that she will give a urine sample tomorrow.    Will update team

## 2019-07-08 DIAGNOSIS — Z79899 Other long term (current) drug therapy: Secondary | ICD-10-CM | POA: Diagnosis not present

## 2019-07-08 DIAGNOSIS — Z940 Kidney transplant status: Secondary | ICD-10-CM | POA: Diagnosis not present

## 2019-07-09 LAB — CBC W/ DIFFERENTIAL
BANDED NEUTROPHILS ABSOLUTE COUNT: 0 10*3/uL (ref 0.0–0.1)
BASOPHILS ABSOLUTE COUNT: 0 10*3/uL (ref 0.0–0.2)
BASOPHILS RELATIVE PERCENT: 1 %
EOSINOPHILS ABSOLUTE COUNT: 0.1 10*3/uL (ref 0.0–0.4)
HEMATOCRIT: 30.8 % — ABNORMAL LOW (ref 34.0–46.6)
HEMOGLOBIN: 9.8 g/dL — ABNORMAL LOW (ref 11.1–15.9)
IMMATURE GRANULOCYTES: 1 %
LYMPHOCYTES ABSOLUTE COUNT: 0.1 10*3/uL — ABNORMAL LOW (ref 0.7–3.1)
LYMPHOCYTES RELATIVE PERCENT: 2 %
MEAN CORPUSCULAR HEMOGLOBIN CONC: 31.8 g/dL (ref 31.5–35.7)
MEAN CORPUSCULAR HEMOGLOBIN: 30.3 pg (ref 26.6–33.0)
MEAN CORPUSCULAR VOLUME: 95 fL (ref 79–97)
NEUTROPHILS RELATIVE PERCENT: 85 %
PLATELET COUNT: 269 10*3/uL (ref 150–450)
RED BLOOD CELL COUNT: 3.23 x10E6/uL — ABNORMAL LOW (ref 3.77–5.28)
RED CELL DISTRIBUTION WIDTH: 15.2 % (ref 11.7–15.4)
WHITE BLOOD CELL COUNT: 6.2 10*3/uL (ref 3.4–10.8)

## 2019-07-09 LAB — RENAL FUNCTION PANEL
BLOOD UREA NITROGEN: 9 mg/dL (ref 8–27)
BUN / CREAT RATIO: 10 — ABNORMAL LOW (ref 12–28)
CALCIUM: 10.7 mg/dL — ABNORMAL HIGH (ref 8.7–10.3)
CHLORIDE: 104 mmol/L (ref 96–106)
CO2: 19 mmol/L — ABNORMAL LOW (ref 20–29)
CREATININE: 0.92 mg/dL (ref 0.57–1.00)
GFR MDRD AF AMER: 78 mL/min/{1.73_m2}
GFR MDRD NON AF AMER: 67 mL/min/{1.73_m2}
GLUCOSE: 153 mg/dL — ABNORMAL HIGH (ref 65–99)
PHOSPHORUS, SERUM: 2.5 mg/dL — ABNORMAL LOW (ref 3.0–4.3)
SODIUM: 139 mmol/L (ref 134–144)

## 2019-07-09 LAB — CO2: Carbon dioxide:SCnc:Pt:Ser/Plas:Qn:: 19 — ABNORMAL LOW

## 2019-07-09 LAB — MAGNESIUM: Magnesium:MCnc:Pt:Ser/Plas:Qn:: 1.1 — ABNORMAL LOW

## 2019-07-09 LAB — PLATELET COUNT: Platelets:NCnc:Pt:Bld:Qn:Automated count: 269

## 2019-07-11 DIAGNOSIS — R319 Hematuria, unspecified: Principal | ICD-10-CM

## 2019-07-11 DIAGNOSIS — Z940 Kidney transplant status: Principal | ICD-10-CM

## 2019-07-11 DIAGNOSIS — Z79899 Other long term (current) drug therapy: Principal | ICD-10-CM

## 2019-07-11 DIAGNOSIS — N390 Urinary tract infection, site not specified: Principal | ICD-10-CM

## 2019-07-11 LAB — URINALYSIS
BILIRUBIN UA: NEGATIVE
BLOOD UA: NEGATIVE
GLUCOSE UA: NEGATIVE
KETONES UA: NEGATIVE
NITRITE UA: NEGATIVE
PH UA: 6 (ref 5.0–7.5)
SPECIFIC GRAVITY UA: 1.017 (ref 1.005–1.030)
UROBILINOGEN UA: 0.2 mg/dL (ref 0.2–1.0)

## 2019-07-11 LAB — MICROSCOPIC EXAMINATION

## 2019-07-11 LAB — RBC URINE

## 2019-07-11 LAB — TACROLIMUS BLOOD: Tacrolimus:MCnc:Pt:Bld:Qn:LC/MS/MS: 14.6

## 2019-07-11 LAB — COLOR

## 2019-07-11 MED ORDER — CIPROFLOXACIN 500 MG TABLET
ORAL_TABLET | Freq: Two times a day (BID) | ORAL | 0 refills | 10 days | Status: CP
Start: 2019-07-11 — End: 2019-07-21

## 2019-07-11 NOTE — Unmapped (Signed)
Patient reports when she pees her urine smells.    Denies fever.  Drinking and eating good      Will go to [facility] and leave urine sample today    Updated [name] NP who states patient needs to leave urine sample and then can start Cipro 500mg  BID for 10days    Called and updated patient who will go to [facility] and start cipro. Asked her to stop medication if any s/s of tendonitis.  Verbalized understanding

## 2019-07-11 NOTE — Unmapped (Signed)
Michele Cisneros 's VALGANCICLOVIR shipment will be canceled  as a result of no refills remain on the prescription.      I have reached out to the patient and communicated the delivery change. We will not reschedule the medication due to MD denied, Medication never prescribed for the patient, pt states she was supposed to stop 1/21 and have removed this/these medication(s) from the work request.  We have canceled this work request.

## 2019-07-11 NOTE — Unmapped (Signed)
[facility] Refill Coordination Note    Specialty Medication(s) to be Shipped:   Transplant: valgancyclovir 450mg  **Sent rf request**    Other medication(s) to be shipped: carvedilol 12.5mg  and bactrim     [name], DOB: [birth year]  Phone: [phone] (home)       All above HIPAA information was verified with patient.     Was a Nurse, learning disability used for this call? No    Completed refill call assessment today to schedule patient's medication shipment from the [facility] [phone]).       Specialty medication(s) and dose(s) confirmed: Regimen is correct and unchanged.   Changes to medications: Tranice reports no changes at this time.  Changes to insurance: No  Questions for the pharmacist: No    Confirmed patient received Welcome Packet with first shipment. The patient will receive a drug information handout for each medication shipped and additional FDA Medication Guides as required.       DISEASE/MEDICATION-SPECIFIC INFORMATION        N/A    SPECIALTY MEDICATION ADHERENCE     Medication Adherence    Patient reported X missed doses in the last month: 0  Specialty Medication: valGANciclovir 450 mg tablet (VALCYTE)  Patient is on additional specialty medications: No          Valganciclovir 450mg : 4 days of medicine on hand     SHIPPING     Shipping address confirmed in Epic.     Delivery Scheduled: Yes, Expected medication delivery date: 07/13/2019.     Medication will be delivered via UPS to the prescription address in Epic WAM.    Lorelei Pont [facility] Specialty Technician

## 2019-07-12 DIAGNOSIS — Z940 Kidney transplant status: Principal | ICD-10-CM

## 2019-07-12 MED ORDER — TACROLIMUS ER 1 MG TABLET,EXTENDED RELEASE 24 HR
ORAL_TABLET | 11 refills | 0 days | Status: CP
Start: 2019-07-12 — End: ?
  Filled 2019-07-19: qty 90, 30d supply, fill #0

## 2019-07-12 MED ORDER — TACROLIMUS ER 4 MG TABLET,EXTENDED RELEASE 24 HR
ORAL_TABLET | 11 refills | 0 days | Status: CP
Start: 2019-07-12 — End: ?

## 2019-07-12 MED FILL — CARVEDILOL 12.5 MG TABLET: 30 days supply | Qty: 180 | Fill #2 | Status: AC

## 2019-07-12 MED FILL — SULFAMETHOXAZOLE 400 MG-TRIMETHOPRIM 80 MG TABLET: 28 days supply | Qty: 12 | Fill #2 | Status: AC

## 2019-07-12 MED FILL — CARVEDILOL 12.5 MG TABLET: ORAL | 30 days supply | Qty: 180 | Fill #2

## 2019-07-12 MED FILL — SULFAMETHOXAZOLE 400 MG-TRIMETHOPRIM 80 MG TABLET: ORAL | 28 days supply | Qty: 12 | Fill #2

## 2019-07-12 NOTE — Unmapped (Signed)
Called patient to let her know her tac level was high.    Per [name] NP decrease envarsus 13mg  daily    Patient verbalized understanding. Reports she is feeling a little better after starting cipro. Encouraged her to make sure she is drinking a lot of fluids

## 2019-07-13 NOTE — Unmapped (Signed)
Change in Envarsus dosage decrease from 14 mg to 13 mg. Co-pay $0.00 for 1 and 4 mg tablets.

## 2019-07-13 NOTE — Unmapped (Signed)
[name] Refill Coordination Note    Specialty Medication(s) to be Shipped:   Transplant: Envarsus 1mg , Envarsus 4mg  and mycophenolate mofetil 180mg     Other medication(s) to be shipped: amlodipine 10mg , atorvastatin 40mg , clonidine 0.1mg , metformin 500mg , and pregabalin 75mg      Michele Cisneros, DOB: 05/09/1956  Phone: [phone] (home)       All above HIPAA information was verified with patient.     Was a Nurse, learning disability used for this call? No    Completed refill call assessment today to schedule patient's medication shipment from the [facility] [phone]).       Specialty medication(s) and dose(s) confirmed: Patient reports changes to the regimen as follows: Envarsus is now 13mg  daily **new rx's are on profile**   Changes to medications: Navneet reports no changes at this time.  Changes to insurance: No  Questions for the pharmacist: No    Confirmed patient received Welcome Packet with first shipment. The patient will receive a drug information handout for each medication shipped and additional FDA Medication Guides as required.       DISEASE/MEDICATION-SPECIFIC INFORMATION        N/A    SPECIALTY MEDICATION ADHERENCE     Medication Adherence    Patient reported X missed doses in the last month: 0  Specialty Medication: Mycophenolate 180mg   Patient is on additional specialty medications: Yes  Additional Specialty Medications: Envarsus 1mg   Patient Reported Additional Medication X Missed Doses in the Last Month: 0  Patient is on more than two specialty medications: Yes  Specialty Medication: Envarsus 4mg   Patient Reported Additional Medication X Missed Doses in the Last Month: 0          Mycophenolate 180 mg: 7 days of medicine on hand   Envarsus 1 mg: 7 days of medicine on hand   Envarsus 4 mg: 7 days of medicine on hand     SHIPPING     Shipping address confirmed in Epic.     Delivery Scheduled: Yes, Expected medication delivery date: 07/20/2019.     Medication will be delivered via Next Day Courier to the prescription address in Epic WAM.    [name]   [facility] Specialty Technician

## 2019-07-14 DIAGNOSIS — Z940 Kidney transplant status: Secondary | ICD-10-CM | POA: Diagnosis not present

## 2019-07-14 DIAGNOSIS — Z79899 Other long term (current) drug therapy: Secondary | ICD-10-CM | POA: Diagnosis not present

## 2019-07-14 LAB — LOG10 CMV QN DNA PL

## 2019-07-15 LAB — RENAL FUNCTION PANEL
ALBUMIN: 3.8 g/dL (ref 3.8–4.8)
BLOOD UREA NITROGEN: 14 mg/dL (ref 8–27)
BUN / CREAT RATIO: 12 (ref 12–28)
CALCIUM: 10.4 mg/dL — ABNORMAL HIGH (ref 8.7–10.3)
CHLORIDE: 104 mmol/L (ref 96–106)
CO2: 19 mmol/L — ABNORMAL LOW (ref 20–29)
CREATININE: 1.14 mg/dL — ABNORMAL HIGH (ref 0.57–1.00)
GFR MDRD AF AMER: 60 mL/min/{1.73_m2}
GFR MDRD NON AF AMER: 52 mL/min/{1.73_m2} — ABNORMAL LOW
PHOSPHORUS, SERUM: 3.6 mg/dL (ref 3.0–4.3)
POTASSIUM: 4.4 mmol/L (ref 3.5–5.2)
SODIUM: 138 mmol/L (ref 134–144)

## 2019-07-15 LAB — CO2: Carbon dioxide:SCnc:Pt:Ser/Plas:Qn:: 19 — ABNORMAL LOW

## 2019-07-15 LAB — CBC W/ DIFFERENTIAL
BASOPHILS ABSOLUTE COUNT: 0.1 10*3/uL (ref 0.0–0.2)
BASOPHILS RELATIVE PERCENT: 1 %
EOSINOPHILS ABSOLUTE COUNT: 0.2 10*3/uL (ref 0.0–0.4)
EOSINOPHILS RELATIVE PERCENT: 2 %
HEMATOCRIT: 30.9 % — ABNORMAL LOW (ref 34.0–46.6)
IMMATURE GRANULOCYTES: 1 %
LYMPHOCYTES ABSOLUTE COUNT: 0.2 10*3/uL — ABNORMAL LOW (ref 0.7–3.1)
LYMPHOCYTES RELATIVE PERCENT: 3 %
MEAN CORPUSCULAR HEMOGLOBIN CONC: 33.3 g/dL (ref 31.5–35.7)
MEAN CORPUSCULAR HEMOGLOBIN: 30.2 pg (ref 26.6–33.0)
MEAN CORPUSCULAR VOLUME: 91 fL (ref 79–97)
MONOCYTES ABSOLUTE COUNT: 0.6 10*3/uL (ref 0.1–0.9)
NEUTROPHILS ABSOLUTE COUNT: 5.2 10*3/uL (ref 1.4–7.0)
NEUTROPHILS RELATIVE PERCENT: 83 %
PLATELET COUNT: 302 10*3/uL (ref 150–450)
RED BLOOD CELL COUNT: 3.41 x10E6/uL — ABNORMAL LOW (ref 3.77–5.28)
RED CELL DISTRIBUTION WIDTH: 15.2 % (ref 11.7–15.4)
WHITE BLOOD CELL COUNT: 6.2 10*3/uL (ref 3.4–10.8)

## 2019-07-15 LAB — MAGNESIUM: Magnesium:MCnc:Pt:Ser/Plas:Qn:: 1.2 — ABNORMAL LOW

## 2019-07-15 LAB — PLATELET COUNT: Platelets:NCnc:Pt:Bld:Qn:Automated count: 302

## 2019-07-16 LAB — LOG10 CMV QN DNA PL

## 2019-07-17 LAB — TACROLIMUS BLOOD: Tacrolimus:MCnc:Pt:Bld:Qn:LC/MS/MS: 13.4

## 2019-07-18 DIAGNOSIS — Z940 Kidney transplant status: Principal | ICD-10-CM

## 2019-07-18 DIAGNOSIS — Z79899 Other long term (current) drug therapy: Principal | ICD-10-CM

## 2019-07-18 MED ORDER — SODIUM BICARBONATE 650 MG TABLET
ORAL_TABLET | Freq: Two times a day (BID) | ORAL | 1 refills | 50.00000 days | Status: CP
Start: 2019-07-18 — End: 2020-07-17
  Filled 2019-07-20: qty 200, 100d supply, fill #0

## 2019-07-18 NOTE — Unmapped (Signed)
Transplant Nephrology Clinic Visit      History of Present Illness    64 y.o. female here for follow up after kidney transplantation.  Patient has ESRD secondary to DM II.  Patient has been on dialysis since 02/07/2014.  Patient's history includes HTN, CAD, CHF, HLD, COPD, PVD, sickle cell trait, and chronic back pain.     Patient was admitted for kidney transplant on 03/28/2019.  She received 1 unit pRBC on 03/30/2019 and 04/03/2019.  Patient had delayed graft function after transplant, but no dialysis was needed. RUS on 03/30/2019 showed 1.8 cm hypoechoic fluid collection abutting the left lower pole of the transplant kidney likely representing postsurgical seroma.  Patient discharged on 04/06/2019 to her home.       Transplant History:    Organ Received: Left DDKT, DBD, SCD, KDPI: 70%  Resp culture: + strep pneumo  Native Kidney Disease: DM II; cPRA 49%  Date of Transplant: 03/30/2019  Post-Transplant Course: DGF  Prior Transplants: No  Induction: [place]  Date of Ureteral Stent Removal: 06/13/2019  Current Immunosuppression: Tacrolimus/Myfortic  CMV/EBV Status: CMV D-/R+  EBV D+/R+  Rejection Episodes: None  Donor Specific Antibodies: None  Results of Renal Imaging (pre and post):      Pre-Txp 07/07/17  -Kidneys are small with mildly increased echogenicity bilaterally. No solid masses or calculi in the visualized kidneys. No hydronephrosis    CT Abdomen/Pelvis w/o Contrast 03/29/2019 (pre-txp)  -1.2 cm hypodensity in the right kidney lower pole (2:48), increased in size from prior however incompletely characterized given lack of intravenous contrast.  -Uterus is surgically absent.    Post-Txp 05/08/2019 (txp kidney only)  --The renal transplant was located in the left lower quadrant. Normal size and echogenicity.  No solid masses or calculi. No perinephric collections identified. Double-J ureteral stent with proximal tip within the renal pelvis and distal tip within the bladder. Mild caliectasis. No hydronephrosis. --Interval increase in resistive indices within the arcuate, segmental and main renal arteries measuring up to 0.98. Attention on follow-up.  --Improved perfusion in the upper pole of the transplant kidney, now with normal perfusion throughout the renal parenchyma.    Current Immunosuppression Regimen:   Envarsus 13 mg daily  Myfortic 360 mg BID      Subjective/Interval:   Since last visit patient had an EGD on 07/05/19 that showed benign-apperaring esophageal stenosis, dilated to 18 mm.  Normal duodenum also examined.  Patient called on 07/07/19 stating she fell at home without injury.  She called on 07/11/19 complaining of an odor when she urinates.  Started on Cipro x 10 days on 07/11/19 (ends 2/10).      Patient presents today with continued pain/burning with urination.  Last dose of Cipro to be taken today.  She denies any dysuria, hematuria, urgency/frequency or odor.  She had some diarrhea this morning but that has been her only occurrence. Home BP running 120-140/50-60's.  Patient still has not purchased a smaller cuff due to cost.  Hand tremor seems to be stable today and has not gotten worse. Patient complaining of pain over graft site.  She states it has been there since transplant but is worse now.  Pain is with palpation and not when lightly touch the skin.  She continues to walk with her cane and denies any falls.  She has the occasional headache at times.     She denies chest pain, shortness of breath, n/v, and edema.     Last dose of Prograf: 9 AM  Past Medical History  1. DM II  2. HTN  3. HLD  4. CAD  5. CHF  6. COPD  7. PVD  8. Sickle Cell Trait  9. DDD with chronic pain    Review of Systems    Otherwise as per HPI, all other systems reviewed and are negative.    Medications  Current Outpatient Medications   Medication Sig Dispense Refill   ??? acetaminophen (TYLENOL) 500 MG tablet Take 1-2 tablets (500-1,000 mg total) by mouth every six (6) hours as needed for pain or fever (> 38C or 100.50F). 30 tablet 0   ??? albuterol (PROVENTIL HFA;VENTOLIN HFA) 90 mcg/actuation inhaler Inhale 2 puffs every six (6) hours as needed for wheezing.     ??? amLODIPine (NORVASC) 10 MG tablet Take 1 tablet (10 mg total) by mouth daily. 30 tablet 11   ??? aspirin (ECOTRIN) 81 MG tablet Take 81 mg by mouth daily.     ??? atorvastatin (LIPITOR) 40 MG tablet Take 1 tablet (40 mg total) by mouth daily. 30 tablet 11   ??? blood sugar diagnostic (CONTOUR TEST STRIPS) Strp by Other route two (2) times a day. TEST BLOOD SUGARS 2 TIMES DAILY 60 strip 5   ??? blood-glucose meter (ONETOUCH ULTRA SYSTEM KIT) kit Please dispense for patient to use at home to check blood sugars.     ??? carvediloL (COREG) 12.5 MG tablet Take 3 tablets (37.5 mg total) by mouth Two (2) times a day. 180 tablet 11   ??? ciprofloxacin HCl (CIPRO) 500 MG tablet Take 1 tablet (500 mg total) by mouth Two (2) times a day for 10 days. 20 tablet 0   ??? cloNIDine HCL (CATAPRES) 0.1 MG tablet Take 2 tablets (0.2 mg total) by mouth every eight (8) hours. 180 tablet 11   ??? ergocalciferol (VITAMIN D2) 1,250 mcg (50,000 unit) capsule Take 1 capsule (50,000 Units total) by mouth once a week. Take for 8 weeks 4 capsule 2   ??? LANCETS,THIN MISC Using at home to test blood sugars twice daily.     ??? lidocaine 2% viscous (XYLOCAINE) 2 % Soln Use 15 mL by Mouth as directed Every four (4) hours. 2000 mL 5   ??? magnesium oxide (MAGOX) 400 mg (241.3 mg magnesium) tablet Take 1 tablet (400 mg total) by mouth Two (2) times a day. 120 tablet 11   ??? metFORMIN (GLUCOPHAGE XR) 500 MG 24 hr tablet Take 2 tablets (1,000 mg total) by mouth every morning AND 1 tablet (500 mg total) nightly. 90 tablet 11   ??? mirtazapine (REMERON) 15 MG tablet Take 1 tablet (15 mg total) by mouth nightly. 90 tablet 3   ??? mycophenolate (MYFORTIC) 180 MG EC tablet Take 2 tablets (360 mg total) by mouth Two (2) times a day. 120 tablet 11   ??? nitrofurantoin, macrocrystal-monohydrate, (MACROBID) 100 MG capsule Take 1 capsule (100 mg total) by mouth every twelve (12) hours. 14 capsule 0   ??? nitroglycerin (NITROSTAT) 0.4 MG SL tablet Place 0.4 mg under the tongue daily as needed.     ??? pantoprazole (PROTONIX) 40 MG tablet Take 1 tablet (40 mg total) by mouth daily. 90 tablet 3   ??? pregabalin (LYRICA) 75 MG capsule Take 1 capsule (75 mg total) by mouth Two (2) times a day. 60 capsule 3   ??? promethazine (PHENERGAN) 25 MG tablet Take 1 tablet (25 mg total) by mouth every six (6) hours as needed for up to 28 doses. 30 each 3   ???  sodium bicarbonate 650 mg tablet Take 1 tablet (650 mg total) by mouth Two (2) times a day. 100 tablet 1   ??? sulfamethoxazole-trimethoprim (BACTRIM) 400-80 mg per tablet Take 1 tablet (80 mg of trimethoprim total) by mouth Every Monday, Wednesday, and Friday. (Patient not taking: Reported on 07/05/2019) 12 tablet 5   ??? tacrolimus (ENVARSUS XR) 1 mg Tb24 extended release tablet Take 1 tablet (1mg ) by mouth in the morning with 3 (4mg ) tablets for total dose of 13mg  every morning 30 tablet 11   ??? tacrolimus (ENVARSUS XR) 4 mg Tb24 extended release tablet Take 3 tablets (12 mg) by mouth in the morning with 1 (1mg ) for total dose of 13mg  every morning 90 tablet 11     No current facility-administered medications for this visit.            Physical Exam  BP 148/63 (BP Site: R Arm, BP Position: Sitting, BP Cuff Size: Medium)  - Pulse 72  - Temp 35.9 ??C (96.6 ??F) (Temporal)  - Ht 170.2 cm (5' 7)  - Wt 67.2 kg (148 lb 3.2 oz)  - BMI 23.21 kg/m??   General: no acute distress  HEENT: wearing mask, PERRL  Neck: neck supple, no cervical lymphadenopathy appreciated  CV: normal rate, normal rhythm, no murmur, no gallops, no rubs appreciated  Lungs: clear to auscultation bilaterally  Abdomen: soft, tender with palpation over graft site;  Incision healed; no drainage or odor noted; no erythema;   Extremities: no edema  Musculoskeletal: no visible deformity, normal range of motion.  Pulses: intact distally throughout  Neurologic: awake, alert, and oriented x3      Laboratory Data and Imaging reviewed in EPIC      Assessment:  64 y.o. female status post deceased donor kidney transplant on 03/30/2019 for ESRD secondary to DM II who presents for routine follow up and post-transplant care.         Recommendations/Plan:     Allograft Function: Renal function is 1.41 with a baseline of 1.1-1.3 since transplant.   No DSAs as of 05/20/19.  Urine not left today.      Immunosuppression Management [High Risk Medical Decision Making For Drug Therapy Requiring Intensive Monitoring For Toxicity]: Tacrolimus trough level 15.3 (13 hr trough). Decrease Tacrolimus 12 mg daily.  Targeting tacrolimus trough levels of approximately 8-10 ng/mL.   Continue mycophenolate 360 mg BID due to low WBC.      Blood Pressure Management: BP 148/63 at this visit. Continue current regimen. Will continue to monitor BP.  Patient reminded to get a smaller cuff.     Lipid Management: Last lipid panel on 05/20/19.  Continue atorvastatin. The ASCVD Risk score Denman George DC Jorge Ny al., 2013) failed to calculate.    Electrolytes: Electrolyte and metabolic parameters in acceptable range.  Will continue to monitor.  Mag 1.3 today.       Infectious Prophylaxis and Monitoring: CMV VL not detected 07/14/19, with today's pending.  BK VL not detected 05/20/2019, with today's pending.  Will continue to monitor.   The patient continues on Bactrim (end 09/28/19) prophylaxis.   Hep C antibody came back positive on 03/28/19.  HCV Ab grayzone 04/25/2019 with HCV VL not detected same day.    Pulmonary Nodule: Seen on NM Stress 07/07/17.  Subcentimeter nodule is stable from previous study 03/18/16 and 03/07/14    Lumbar Radiculopathy: Following with [facility] with next appt 04/26/2019 with MRI of spine.  Patient states she is going to change it  to a virtual appointment.  Chronic low back pain with numbness to BLE at time.  Sometimes Right foot drags as well.  Plan is for surgery in the future for Lumbosacral DDD. Right Breast Soft Tissue Mass: no size change since 03/18/2016.  F/U due 2020. Patient needs to schedule a mammogram.    Neuropathy: Continue on Lyrica.  Follows with [facility], last appt 03/17/2019.     Thyroid Nodule: [place] 06/16/17 showed right upper thyroid nodule.  Annual [place] needed.  Thyroid US ordered by PCP on 06/16/19 but has not been scheduled yet.    Pain at Graft site: Renal ultrasound today showed decreased resistive indices in the renal transplant compared with prior and are now at or just above normal limits.  Pain may be from internal sutures.     GERD: Continue protonix.  Denies symptoms today.    DM II: Metformin 500mg  BID.  Last Hgb A1C 4.9 on 05/20/19.    Esophageal/Stomach Ulcers: Protonix BID, Lidocaine solution, Carafate.  Saw GI on 06/20/19 who stated to continue Carafate and protonix.  EGD showed benign-apperaring esophageal stenosis, dilated to 18 mm.  Normal duodenum also examined.    UTI: Cipro 2/1-2/10 for positive culture on 2/1.  Macrobid prescribed today.      Health Maintenance:   Mammo: 03/04/18; f/u 1 year  Pap Smear: Hysterectomy  Colonoscopy: 08/20/2017; repeat 5 years due to multiple tubular adenomas    Immunizations:   Flu Shot: 03/18/2019  Prevnar 13: not noted in records from 2012 and after.  Will get after 1 year post transplant  Pneumovax: 03/23/18    Counseling:  I counseled the patient on:  The need to avoid sun exposure and the use of sunblock while outdoors given the relatively higher risk of skin malignancy in an immunosuppressed state.  The need for adherence to immunosuppression medication.  Patient verbalized understanding.     Follow-Up:  Return to clinic 4 weeks with Dr. Margaretmary Bayley.  Patient graduated today.  Labs: twice a week due to tac levels.    Patient will continue to follow-up with his primary care provider for non-transplant related issues and medication refills. We have ordered transplant specific labs per the center's guidelines to monitor and assess for toxicities from immunosuppressant drug therapy.        Antonea Gaut L. Marina Goodell, MSN, APRN, AGNP-C - Kidney Transplant Nurse Practitioner  [facility]

## 2019-07-19 MED FILL — PREGABALIN 75 MG CAPSULE: ORAL | 30 days supply | Qty: 60 | Fill #2

## 2019-07-19 MED FILL — AMLODIPINE 10 MG TABLET: 30 days supply | Qty: 30 | Fill #2 | Status: AC

## 2019-07-19 MED FILL — CLONIDINE HCL 0.1 MG TABLET: 30 days supply | Qty: 180 | Fill #2 | Status: AC

## 2019-07-19 MED FILL — PREGABALIN 75 MG CAPSULE: 30 days supply | Qty: 60 | Fill #2 | Status: AC

## 2019-07-19 MED FILL — ENVARSUS XR 4 MG TABLET,EXTENDED RELEASE: 30 days supply | Qty: 90 | Fill #0 | Status: AC

## 2019-07-19 MED FILL — ENVARSUS XR 1 MG TABLET,EXTENDED RELEASE: ORAL | 30 days supply | Qty: 30 | Fill #0

## 2019-07-19 MED FILL — AMLODIPINE 10 MG TABLET: ORAL | 30 days supply | Qty: 30 | Fill #2

## 2019-07-19 MED FILL — ATORVASTATIN 40 MG TABLET: ORAL | 30 days supply | Qty: 30 | Fill #0

## 2019-07-19 MED FILL — CLONIDINE HCL 0.1 MG TABLET: ORAL | 30 days supply | Qty: 180 | Fill #2

## 2019-07-19 MED FILL — METFORMIN ER 500 MG TABLET,EXTENDED RELEASE 24 HR: 30 days supply | Qty: 90 | Fill #0 | Status: AC

## 2019-07-19 MED FILL — ENVARSUS XR 1 MG TABLET,EXTENDED RELEASE: 30 days supply | Qty: 30 | Fill #0 | Status: AC

## 2019-07-19 MED FILL — ATORVASTATIN 40 MG TABLET: 30 days supply | Qty: 30 | Fill #0 | Status: AC

## 2019-07-19 MED FILL — MYCOPHENOLATE SODIUM 180 MG TABLET,DELAYED RELEASE: 30 days supply | Qty: 120 | Fill #0 | Status: AC

## 2019-07-19 MED FILL — MYCOPHENOLATE SODIUM 180 MG TABLET,DELAYED RELEASE: ORAL | 30 days supply | Qty: 120 | Fill #0

## 2019-07-20 ENCOUNTER — Encounter: Admit: 2019-07-20 | Discharge: 2019-07-20 | Payer: MEDICARE

## 2019-07-20 ENCOUNTER — Ambulatory Visit: Admit: 2019-07-20 | Discharge: 2019-07-20 | Payer: MEDICARE

## 2019-07-20 DIAGNOSIS — Z79899 Other long term (current) drug therapy: Principal | ICD-10-CM

## 2019-07-20 DIAGNOSIS — Z940 Kidney transplant status: Principal | ICD-10-CM

## 2019-07-20 DIAGNOSIS — N3000 Acute cystitis without hematuria: Principal | ICD-10-CM

## 2019-07-20 DIAGNOSIS — D899 Disorder involving the immune mechanism, unspecified: Principal | ICD-10-CM

## 2019-07-20 DIAGNOSIS — N186 End stage renal disease: Secondary | ICD-10-CM | POA: Diagnosis not present

## 2019-07-20 DIAGNOSIS — Z7984 Long term (current) use of oral hypoglycemic drugs: Secondary | ICD-10-CM | POA: Diagnosis not present

## 2019-07-20 DIAGNOSIS — I132 Hypertensive heart and chronic kidney disease with heart failure and with stage 5 chronic kidney disease, or end stage renal disease: Secondary | ICD-10-CM | POA: Diagnosis not present

## 2019-07-20 DIAGNOSIS — K228 Other specified diseases of esophagus: Secondary | ICD-10-CM | POA: Diagnosis not present

## 2019-07-20 DIAGNOSIS — Z6823 Body mass index (BMI) 23.0-23.9, adult: Secondary | ICD-10-CM | POA: Diagnosis not present

## 2019-07-20 DIAGNOSIS — I509 Heart failure, unspecified: Secondary | ICD-10-CM | POA: Diagnosis not present

## 2019-07-20 DIAGNOSIS — Z4822 Encounter for aftercare following kidney transplant: Secondary | ICD-10-CM | POA: Diagnosis not present

## 2019-07-20 DIAGNOSIS — E1140 Type 2 diabetes mellitus with diabetic neuropathy, unspecified: Secondary | ICD-10-CM | POA: Diagnosis not present

## 2019-07-20 DIAGNOSIS — Z7982 Long term (current) use of aspirin: Secondary | ICD-10-CM | POA: Diagnosis not present

## 2019-07-20 DIAGNOSIS — D849 Immunodeficiency, unspecified: Secondary | ICD-10-CM | POA: Diagnosis not present

## 2019-07-20 DIAGNOSIS — I2510 Atherosclerotic heart disease of native coronary artery without angina pectoris: Secondary | ICD-10-CM | POA: Diagnosis not present

## 2019-07-20 DIAGNOSIS — J449 Chronic obstructive pulmonary disease, unspecified: Secondary | ICD-10-CM | POA: Diagnosis not present

## 2019-07-20 DIAGNOSIS — E785 Hyperlipidemia, unspecified: Secondary | ICD-10-CM | POA: Diagnosis not present

## 2019-07-20 DIAGNOSIS — E1122 Type 2 diabetes mellitus with diabetic chronic kidney disease: Secondary | ICD-10-CM | POA: Diagnosis not present

## 2019-07-20 DIAGNOSIS — E1151 Type 2 diabetes mellitus with diabetic peripheral angiopathy without gangrene: Secondary | ICD-10-CM | POA: Diagnosis not present

## 2019-07-20 LAB — CBC W/ AUTO DIFF
BASOPHILS ABSOLUTE COUNT: 0.1 10*9/L (ref 0.0–0.1)
BASOPHILS RELATIVE PERCENT: 0.5 %
EOSINOPHILS ABSOLUTE COUNT: 0.2 10*9/L (ref 0.0–0.4)
HEMOGLOBIN: 11.9 g/dL — ABNORMAL LOW (ref 12.0–16.0)
LARGE UNSTAINED CELLS: 1 % (ref 0–4)
LYMPHOCYTES ABSOLUTE COUNT: 0.3 10*9/L — ABNORMAL LOW (ref 1.5–5.0)
LYMPHOCYTES RELATIVE PERCENT: 2.5 %
MEAN CORPUSCULAR HEMOGLOBIN CONC: 31.1 g/dL (ref 31.0–37.0)
MEAN CORPUSCULAR HEMOGLOBIN: 30.2 pg (ref 26.0–34.0)
MEAN CORPUSCULAR VOLUME: 97.3 fL (ref 80.0–100.0)
MEAN PLATELET VOLUME: 9.9 fL (ref 7.0–10.0)
MONOCYTES ABSOLUTE COUNT: 0.4 10*9/L (ref 0.2–0.8)
MONOCYTES RELATIVE PERCENT: 4.5 %
NEUTROPHILS RELATIVE PERCENT: 89.7 %
PLATELET COUNT: 250 10*9/L (ref 150–440)
RED BLOOD CELL COUNT: 3.95 10*12/L — ABNORMAL LOW (ref 4.00–5.20)
RED CELL DISTRIBUTION WIDTH: 17.3 % — ABNORMAL HIGH (ref 12.0–15.0)
WBC ADJUSTED: 9.7 10*9/L (ref 4.5–11.0)

## 2019-07-20 LAB — BASIC METABOLIC PANEL
ANION GAP: 10 mmol/L (ref 7–15)
BLOOD UREA NITROGEN: 16 mg/dL (ref 7–21)
BUN / CREAT RATIO: 11
CHLORIDE: 105 mmol/L (ref 98–107)
CO2: 23 mmol/L (ref 22.0–30.0)
CREATININE: 1.41 mg/dL — ABNORMAL HIGH (ref 0.60–1.00)
GLUCOSE RANDOM: 167 mg/dL (ref 70–179)
POTASSIUM: 4.9 mmol/L (ref 3.5–5.0)
SODIUM: 138 mmol/L (ref 135–145)

## 2019-07-20 LAB — PHOSPHORUS: Phosphate:MCnc:Pt:Ser/Plas:Qn:: 4.3

## 2019-07-20 LAB — BASOPHILS ABSOLUTE COUNT: Basophils:NCnc:Pt:Bld:Qn:Automated count: 0.1

## 2019-07-20 LAB — TACROLIMUS, TROUGH: Lab: 15.3 — ABNORMAL HIGH

## 2019-07-20 LAB — CALCIUM: Calcium:MCnc:Pt:Ser/Plas:Qn:: 10.8 — ABNORMAL HIGH

## 2019-07-20 LAB — MAGNESIUM: Magnesium:MCnc:Pt:Ser/Plas:Qn:: 1.3 — ABNORMAL LOW

## 2019-07-20 MED ORDER — NITROFURANTOIN MONOHYDRATE/MACROCRYSTALS 100 MG CAPSULE
ORAL_CAPSULE | Freq: Two times a day (BID) | ORAL | 0 refills | 7.00000 days | Status: CP
Start: 2019-07-20 — End: 2019-07-27

## 2019-07-20 MED ORDER — TACROLIMUS XR 4 MG TABLET,EXTENDED RELEASE 24 HR
ORAL_TABLET | Freq: Every morning | ORAL | 11 refills | 30 days | Status: CP
Start: 2019-07-20 — End: ?

## 2019-07-20 MED FILL — SODIUM BICARBONATE 650 MG TABLET: 100 days supply | Qty: 200 | Fill #0 | Status: AC

## 2019-07-20 NOTE — Unmapped (Signed)
Per [name] NP decrease envarsus by 1mg . Patient taking 13mg  and will start by taking 12 mg tomorrow AM.    Asked patient to try to drink a little extra everyday. She will try.  States she is eating and drinking OK.  Offered PT for patient to build strength but she declined and states her sister and daughter are helping.  Discussed [place] results and new nephologist/clinic    Patient verbalized understanding and denies any other needs

## 2019-07-20 NOTE — Unmapped (Signed)
Assessment    Met w/ patient in [facility] today. Reviewed meds/symptoms. Any new medications? no                Pt reports no fever/cold/flu symptoms    BP: 148/63 today/ Home BP reported similar, only checking in AM   BG waiting for labs   Intermittent Headache/Dizziness/Lightheaded - advised her to check BP at various times/day   Minimal hand tremors   Denies numbness/tingling   Denies fevers/chills/sweats   Denies CP/SOB/palpatations   Denies nausea/vomiting/heartburn   Denies diarrhea/constipation: one episode of diarrhea today   UTI symptoms (burn/pain/itch/frequency/urgency/odor/color/foam): pain w/ urination despite cipro. Will restart macrobid   No visible or palpable edema    Appetite good; reports poor/adequate hydration - 40oz/day is a challenge    Pt reports being well rested and getting adequate exercise despite Covid 19 quarantine. Taking care to mask, hand hygeine and minimal public activity. Offered support and guidance for this process given her immune suppressed state.       Pain: intermittent at incision site w/ palpation   Incision: c/d/i   Drain: n/a   Foley: n/a   Intake: 40oz/day   Output: adequate    Last Envarsus taken 0900; held for this morning's labs.    Other complaints or concerns - fatigued, difficulty drinking water    Referrals needed: RUS txp today re: pain at txp site    Pt Follow up w/ [facility] in 4 weeks    Immunization status: utd      Functional Score: 90     Able to carry on normal activity;  Minor signs or symptoms of disease.     Employment status: not working

## 2019-07-20 NOTE — Unmapped (Signed)
AOBP:Right arm  Medium cuff   Average:148/63 Pulse:72  1st reading:144/63 Pulse:72  2nd reading:145/61 Pulse:71  3rd reading:154/64 Pulse:72

## 2019-07-20 NOTE — Unmapped (Signed)
Change in Envarsus dosage decrease. Co-pay $0.00.

## 2019-07-21 LAB — CMV DNA, QUANTITATIVE, PCR

## 2019-07-21 LAB — BK VIRUS QUANTITATIVE PCR, BLOOD

## 2019-07-21 LAB — CMV VIRAL LD: Lab: NOT DETECTED

## 2019-07-21 LAB — BK BLOOD QUANT: Lab: 0

## 2019-07-25 DIAGNOSIS — Z940 Kidney transplant status: Principal | ICD-10-CM

## 2019-07-25 DIAGNOSIS — Z79899 Other long term (current) drug therapy: Principal | ICD-10-CM

## 2019-07-26 LAB — URINALYSIS
BILIRUBIN UA: NEGATIVE
BLOOD UA: NEGATIVE
GLUCOSE UA: NEGATIVE
NITRITE UA: NEGATIVE
PH UA: 5 (ref 5.0–7.5)
UROBILINOGEN UA: 1 mg/dL (ref 0.2–1.0)

## 2019-07-26 LAB — MICROSCOPIC EXAMINATION: EPITHELIAL CELLS (NON RENAL): >10 /HPF — AB (ref 0–10)

## 2019-07-26 LAB — PROTEIN UA

## 2019-07-26 LAB — CASTS

## 2019-08-01 DIAGNOSIS — Z940 Kidney transplant status: Principal | ICD-10-CM

## 2019-08-01 DIAGNOSIS — Z79899 Other long term (current) drug therapy: Principal | ICD-10-CM

## 2019-08-04 DIAGNOSIS — Z940 Kidney transplant status: Secondary | ICD-10-CM | POA: Diagnosis not present

## 2019-08-04 DIAGNOSIS — Z79899 Other long term (current) drug therapy: Secondary | ICD-10-CM | POA: Diagnosis not present

## 2019-08-04 MED FILL — SULFAMETHOXAZOLE 400 MG-TRIMETHOPRIM 80 MG TABLET: ORAL | 28 days supply | Qty: 12 | Fill #3

## 2019-08-04 MED FILL — SULFAMETHOXAZOLE 400 MG-TRIMETHOPRIM 80 MG TABLET: 28 days supply | Qty: 12 | Fill #3 | Status: AC

## 2019-08-05 LAB — LEUKOCYTE ESTERASE UA: Leukocyte esterase:PrThr:Pt:Urine:Ord:Test strip: NEGATIVE

## 2019-08-05 LAB — URINALYSIS
BILIRUBIN UA: NEGATIVE
BLOOD UA: NEGATIVE
GLUCOSE UA: NEGATIVE
KETONES UA: NEGATIVE
PH UA: 7.5 (ref 5.0–7.5)
PROTEIN UA: NEGATIVE
SPECIFIC GRAVITY UA: 1.011 (ref 1.005–1.030)
UROBILINOGEN UA: 0.2 mg/dL (ref 0.2–1.0)

## 2019-08-05 LAB — WBC URINE: Leukocytes:Naric:Pt:Urine sed:Qn:Microscopy.light.HPF: NONE SEEN

## 2019-08-05 LAB — MICROSCOPIC EXAMINATION
CASTS: NONE SEEN /LPF
WBC URINE: NONE SEEN /HPF (ref 0–5)

## 2019-08-08 DIAGNOSIS — Z79899 Other long term (current) drug therapy: Principal | ICD-10-CM

## 2019-08-08 DIAGNOSIS — Z940 Kidney transplant status: Principal | ICD-10-CM

## 2019-08-09 LAB — URINALYSIS
BILIRUBIN UA: NEGATIVE
GLUCOSE UA: NEGATIVE
KETONES UA: NEGATIVE
LEUKOCYTE ESTERASE UA: NEGATIVE
NITRITE UA: NEGATIVE
PROTEIN UA: NEGATIVE
SPECIFIC GRAVITY UA: 1.015 (ref 1.005–1.030)

## 2019-08-09 LAB — MICROSCOPIC EXAMINATION
BACTERIA: NONE SEEN
CASTS: NONE SEEN /LPF
EPITHELIAL CELLS (NON RENAL): NONE SEEN /HPF (ref 0–10)

## 2019-08-09 LAB — WBC URINE

## 2019-08-09 LAB — NITRITE UA: Nitrite:PrThr:Pt:Urine:Ord:Test strip: NEGATIVE

## 2019-08-10 LAB — CMV QUANT

## 2019-08-10 MED ORDER — PANTOPRAZOLE 40 MG TABLET,DELAYED RELEASE
ORAL_TABLET | Freq: Two times a day (BID) | ORAL | 11 refills | 30.00000 days | Status: CP
Start: 2019-08-10 — End: 2020-08-09
  Filled 2019-08-17: qty 60, 30d supply, fill #0

## 2019-08-11 MED FILL — CARVEDILOL 12.5 MG TABLET: ORAL | 30 days supply | Qty: 180 | Fill #3

## 2019-08-11 MED FILL — CARVEDILOL 12.5 MG TABLET: 30 days supply | Qty: 180 | Fill #3 | Status: AC

## 2019-08-15 DIAGNOSIS — Z79899 Other long term (current) drug therapy: Principal | ICD-10-CM

## 2019-08-15 DIAGNOSIS — Z940 Kidney transplant status: Principal | ICD-10-CM

## 2019-08-16 DIAGNOSIS — Z940 Kidney transplant status: Principal | ICD-10-CM

## 2019-08-16 LAB — MICROSCOPIC EXAMINATION
CASTS: NONE SEEN /LPF
WBC URINE: NONE SEEN /HPF (ref 0–5)

## 2019-08-16 LAB — URINALYSIS
BILIRUBIN UA: NEGATIVE
BLOOD UA: NEGATIVE
GLUCOSE UA: NEGATIVE
KETONES UA: NEGATIVE
LEUKOCYTE ESTERASE UA: NEGATIVE
NITRITE UA: NEGATIVE
PH UA: 7.5 (ref 5.0–7.5)
PROTEIN UA: NEGATIVE
SPECIFIC GRAVITY UA: 1.01 (ref 1.005–1.030)
UROBILINOGEN UA: 0.2 mg/dL (ref 0.2–1.0)

## 2019-08-16 LAB — PROTEIN / CREATININE RATIO, URINE
PROTEIN URINE: 5.1 mg/dL
PROTEIN/CREAT RATIO: 121 mg/g{creat} (ref 0–200)

## 2019-08-16 LAB — BACTERIA: Bacteria:Naric:Pt:Urine sed:Qn:Microscopy.light.HPF: NONE SEEN

## 2019-08-16 LAB — PROTEIN UA: Protein:PrThr:Pt:Urine:Ord:Test strip: NEGATIVE

## 2019-08-16 LAB — PROTEIN URINE: Protein:MCnc:Pt:Urine:Qn:: 5.1

## 2019-08-16 MED ORDER — TACROLIMUS XR 4 MG TABLET,EXTENDED RELEASE 24 HR
ORAL_TABLET | Freq: Every morning | ORAL | 11 refills | 30.00000 days | Status: CP
Start: 2019-08-16 — End: ?
  Filled 2019-08-17: qty 90, 30d supply, fill #0

## 2019-08-16 NOTE — Unmapped (Addendum)
3/10: dose change from 13mg  to 12mg  envarsus. Per coordinator notes pt is aware of change. Only 4mg  envarsus will go out in below order now due to dosage change (1mg  rx no longer active ) -ef      [facility] Refill Coordination Note    Specialty Medication(s) to be Shipped:   Transplant: Envarsus 1mg  and Envarsus 4mg     Other medication(s) to be shipped: amlodipine 10mg , clonidine 0.1mg , magnesium oxide 400mg , metformin 500mg , pantoprazole 40mg , pregablin 75mg      Michele Cisneros, DOB: [birth year]  Phone: [phone] (home)       All above HIPAA information was verified with patient.     Was a Nurse, learning disability used for this call? No    Completed refill call assessment today to schedule patient's medication shipment from the [facility] [phone]).       Specialty medication(s) and dose(s) confirmed: Regimen is correct and unchanged.   Changes to medications: Shaundrea reports no changes at this time.  Changes to insurance: No  Questions for the pharmacist: No    Confirmed patient received Welcome Packet with first shipment. The patient will receive a drug information handout for each medication shipped and additional FDA Medication Guides as required.       DISEASE/MEDICATION-SPECIFIC INFORMATION        N/A    SPECIALTY MEDICATION ADHERENCE     Medication Adherence    Patient reported X missed doses in the last month: 0  Specialty Medication: Envarsus 1mg   Patient is on additional specialty medications: Yes  Additional Specialty Medications: Envarsus 4mg   Patient Reported Additional Medication X Missed Doses in the Last Month: 0  Informant: patient                Envarsus 1 mg: 7 days of medicine on hand   Envarsus 4 mg: 7 days of medicine on hand         SHIPPING     Shipping address confirmed in Epic.     Delivery Scheduled: Yes, Expected medication delivery date: 08/18/19.  However, Rx request for refills was sent to the provider as there are none remaining.     Medication will be delivered via Next Day Courier to the prescription address in Epic WAM.    Jasper Loser   [facility] Specialty Technician

## 2019-08-17 MED FILL — PANTOPRAZOLE 40 MG TABLET,DELAYED RELEASE: 30 days supply | Qty: 60 | Fill #0 | Status: AC

## 2019-08-17 MED FILL — PREGABALIN 75 MG CAPSULE: ORAL | 30 days supply | Qty: 60 | Fill #3

## 2019-08-17 MED FILL — CLONIDINE HCL 0.1 MG TABLET: 30 days supply | Qty: 180 | Fill #3 | Status: AC

## 2019-08-17 MED FILL — METFORMIN ER 500 MG TABLET,EXTENDED RELEASE 24 HR: ORAL | 30 days supply | Qty: 90 | Fill #1

## 2019-08-17 MED FILL — AMLODIPINE 10 MG TABLET: 30 days supply | Qty: 30 | Fill #3 | Status: AC

## 2019-08-17 MED FILL — MAGNESIUM OXIDE 400 MG (241.3 MG MAGNESIUM) TABLET: ORAL | 60 days supply | Qty: 120 | Fill #0

## 2019-08-17 MED FILL — ENVARSUS XR 4 MG TABLET,EXTENDED RELEASE: 30 days supply | Qty: 90 | Fill #0 | Status: AC

## 2019-08-17 MED FILL — MAGNESIUM OXIDE 400 MG (241.3 MG MAGNESIUM) TABLET: 60 days supply | Qty: 120 | Fill #0 | Status: AC

## 2019-08-17 MED FILL — PREGABALIN 75 MG CAPSULE: 30 days supply | Qty: 60 | Fill #3 | Status: AC

## 2019-08-17 MED FILL — METFORMIN ER 500 MG TABLET,EXTENDED RELEASE 24 HR: 30 days supply | Qty: 90 | Fill #1 | Status: AC

## 2019-08-17 MED FILL — CLONIDINE HCL 0.1 MG TABLET: ORAL | 30 days supply | Qty: 180 | Fill #3

## 2019-08-17 MED FILL — AMLODIPINE 10 MG TABLET: ORAL | 30 days supply | Qty: 30 | Fill #3

## 2019-08-17 NOTE — Unmapped (Signed)
Change in Envarsus dosage from 13 mg to 12 mg. Co-pay $0.00. Previous dose was scheduled to ship today and new dose will need to be rescheduled.

## 2019-08-22 DIAGNOSIS — Z79899 Other long term (current) drug therapy: Principal | ICD-10-CM

## 2019-08-22 DIAGNOSIS — Z940 Kidney transplant status: Principal | ICD-10-CM

## 2019-08-26 ENCOUNTER — Encounter: Admit: 2019-08-26 | Discharge: 2019-08-26 | Payer: MEDICARE

## 2019-08-26 ENCOUNTER — Encounter: Admit: 2019-08-26 | Discharge: 2019-08-26 | Payer: MEDICARE | Attending: Nephrology | Primary: Nephrology

## 2019-08-26 DIAGNOSIS — J431 Panlobular emphysema: Secondary | ICD-10-CM | POA: Diagnosis not present

## 2019-08-26 DIAGNOSIS — E782 Mixed hyperlipidemia: Secondary | ICD-10-CM | POA: Diagnosis not present

## 2019-08-26 DIAGNOSIS — I5032 Chronic diastolic (congestive) heart failure: Secondary | ICD-10-CM | POA: Diagnosis not present

## 2019-08-26 DIAGNOSIS — I340 Nonrheumatic mitral (valve) insufficiency: Secondary | ICD-10-CM | POA: Diagnosis not present

## 2019-08-26 DIAGNOSIS — I10 Essential (primary) hypertension: Secondary | ICD-10-CM | POA: Diagnosis not present

## 2019-08-26 DIAGNOSIS — K2210 Ulcer of esophagus without bleeding: Secondary | ICD-10-CM | POA: Diagnosis not present

## 2019-08-26 DIAGNOSIS — I25118 Atherosclerotic heart disease of native coronary artery with other forms of angina pectoris: Secondary | ICD-10-CM | POA: Diagnosis not present

## 2019-08-26 DIAGNOSIS — Z940 Kidney transplant status: Secondary | ICD-10-CM | POA: Diagnosis not present

## 2019-08-26 DIAGNOSIS — I6523 Occlusion and stenosis of bilateral carotid arteries: Secondary | ICD-10-CM | POA: Diagnosis not present

## 2019-08-26 DIAGNOSIS — I38 Endocarditis, valve unspecified: Secondary | ICD-10-CM | POA: Diagnosis not present

## 2019-08-26 DIAGNOSIS — Z9989 Dependence on other enabling machines and devices: Secondary | ICD-10-CM | POA: Diagnosis not present

## 2019-08-26 DIAGNOSIS — D849 Immunodeficiency, unspecified: Secondary | ICD-10-CM | POA: Diagnosis not present

## 2019-08-26 DIAGNOSIS — G4733 Obstructive sleep apnea (adult) (pediatric): Secondary | ICD-10-CM | POA: Diagnosis not present

## 2019-08-26 DIAGNOSIS — Z6823 Body mass index (BMI) 23.0-23.9, adult: Secondary | ICD-10-CM | POA: Diagnosis not present

## 2019-08-26 MED ORDER — METOCLOPRAMIDE 5 MG TABLET
ORAL_TABLET | Freq: Three times a day (TID) | ORAL | 1 refills | 30.00000 days | PRN
Start: 2019-08-26 — End: 2020-08-25

## 2019-08-26 MED ORDER — MELATONIN 3 MG TABLET
Freq: Every evening | ORAL | 0 refills | 0 days | PRN
Start: 2019-08-26 — End: ?

## 2019-08-26 MED ORDER — PREGABALIN 75 MG CAPSULE
ORAL_CAPSULE | 3 refills | 0.00000 days | Status: CP
Start: 2019-08-26 — End: ?
  Filled 2019-09-13: qty 90, 30d supply, fill #0

## 2019-08-26 MED ORDER — CLONIDINE HCL 0.1 MG TABLET
ORAL_TABLET | Freq: Two times a day (BID) | ORAL | 11 refills | 30 days | Status: CP
Start: 2019-08-26 — End: 2020-08-25
  Filled 2019-10-19: qty 60, 30d supply, fill #0

## 2019-08-26 NOTE — Unmapped (Signed)
[facility] PHARMACY NOTE  08/26/2019   Michele Cisneros  [id number]    Medication changes today:   1. Decrease pantoprazole to 40 mg once daily  2. Decrease clonidine to 0.1 mg BID  3. Increase Lyrica to 150 mg qAM and 300 mg HS    Education/Adherence tools provided today:  1.provided updated medication list  2. provided additional pill box education  3.  provided additional education on immunosuppression and transplant related medications including reviewing indications of medications, dosing and side effects    Follow up items:  1. goal of understanding indications and dosing of immunosuppression medications  2. Ergocalciferol at next visit  3. WBC and if can increase Myfortic dose    Next visit with pharmacy in 1-3 months  ____________________________________________________________________    Michele Cisneros is a 64 y.o. female s/p deceased kidney transplant on Apr 28, 2019 (Kidney) 2/2 T2DM.     Other PMH significant for hypertension, T2DM, CAD s/p PCI     Seen by pharmacy today for: medication management and pill box fill and adherence education; last seen by pharmacy 4 weeks ago     Hospitalization 11/22-12/1 for nausea, vomiting, and chest pain.  EGD was performed which showed severe esophageal ulcerations.      CC:  Patient complains of orthostasis     There were no vitals filed for this visit.    Allergies   Allergen Reactions   ??? Hydrocodone Hives   ??? Lisinopril      Unknown reaction    ??? Perfume        All medications reviewed and updated.     Medication list includes revisions made during today???s encounter    Outpatient Encounter Medications as of 08/26/2019   Medication Sig Dispense Refill   ??? acetaminophen (TYLENOL) 500 MG tablet Take 1-2 tablets (500-1,000 mg total) by mouth every six (6) hours as needed for pain or fever (> 38C or 100.63F). 30 tablet 0   ??? albuterol (PROVENTIL HFA;VENTOLIN HFA) 90 mcg/actuation inhaler Inhale 2 puffs every six (6) hours as needed for wheezing.     ??? amLODIPine (NORVASC) 10 MG tablet Take 1 tablet (10 mg total) by mouth daily. 30 tablet 11   ??? aspirin (ECOTRIN) 81 MG tablet Take 81 mg by mouth daily.     ??? atorvastatin (LIPITOR) 40 MG tablet Take 1 tablet (40 mg total) by mouth daily. 30 tablet 11   ??? blood sugar diagnostic (CONTOUR TEST STRIPS) Strp by Other route two (2) times a day. TEST BLOOD SUGARS 2 TIMES DAILY 60 strip 5   ??? blood-glucose meter (ONETOUCH ULTRA SYSTEM KIT) kit Please dispense for patient to use at home to check blood sugars.     ??? carvediloL (COREG) 12.5 MG tablet Take 3 tablets (37.5 mg total) by mouth Two (2) times a day. 180 tablet 11   ??? cloNIDine HCL (CATAPRES) 0.1 MG tablet Take 2 tablets (0.2 mg total) by mouth every eight (8) hours. 180 tablet 11   ??? ergocalciferol (VITAMIN D2) 1,250 mcg (50,000 unit) capsule Take 1 capsule (50,000 Units total) by mouth once a week. Take for 8 weeks 4 capsule 2   ??? LANCETS,THIN MISC Using at home to test blood sugars twice daily.     ??? lidocaine 2% viscous (XYLOCAINE) 2 % Soln Use 15 mL by Mouth as directed Every four (4) hours. 2000 mL 5   ??? magnesium oxide (MAGOX) 400 mg (241.3 mg magnesium) tablet Take 1 tablet (  400 mg total) by mouth Two (2) times a day. 120 tablet 11   ??? melatonin 3 mg Tab Take 2 tablets (6 mg total) by mouth nightly as needed.  0   ??? metFORMIN (GLUCOPHAGE XR) 500 MG 24 hr tablet Take 2 tablets (1,000 mg total) by mouth every morning AND 1 tablet (500 mg total) nightly. 90 tablet 11   ??? metoclopramide (REGLAN) 5 MG tablet Take 1 tablet (5 mg total) by mouth Three (3) times a day as needed (nausea). 90 tablet 1   ??? mirtazapine (REMERON) 15 MG tablet Take 1 tablet (15 mg total) by mouth nightly. 90 tablet 3   ??? mycophenolate (MYFORTIC) 180 MG EC tablet Take 2 tablets (360 mg total) by mouth Two (2) times a day. 120 tablet 11   ??? nitroglycerin (NITROSTAT) 0.4 MG SL tablet Place 0.4 mg under the tongue daily as needed.     ??? pregabalin (LYRICA) 75 MG capsule Take 1 capsule (75 mg total) by mouth Two (2) times a day. 60 capsule 3   ??? promethazine (PHENERGAN) 25 MG tablet Take 1 tablet (25 mg total) by mouth every six (6) hours as needed for up to 28 doses. 30 each 3   ??? sodium bicarbonate 650 mg tablet Take 1 tablet (650 mg total) by mouth Two (2) times a day. 100 tablet 1   ??? sulfamethoxazole-trimethoprim (BACTRIM) 400-80 mg per tablet Take 1 tablet (80 mg of trimethoprim total) by mouth Every Monday, Wednesday, and Friday. 12 tablet 5   ??? tacrolimus (ENVARSUS XR) 4 mg Tb24 extended release tablet Take 3 tablets (12 mg total) by mouth every morning. 90 tablet 11     No facility-administered encounter medications on file as of 08/26/2019.      Induction agent : alemtuzumab    CURRENT IMMUNOSUPPRESSION: Envarsus 12 mg PO qd   prograf/Envarsus/cyclosporine goal: 6-8   myfortic360  mg PO bid (decreased 12/22 due to leukopenia)   steroid free     Patient complains of minor tremor    IMMUNOSUPPRESSION DRUG LEVELS:  Lab Results   Component Value Date    Tacrolimus, Trough 15.3 (H) 07/20/2019    Tacrolimus, Trough 4.7 (L) 05/20/2019    Tacrolimus, Trough 5.7 05/10/2019    Tacrolimus Lvl 13.4 07/14/2019    Tacrolimus Lvl 14.6 07/08/2019    Tacrolimus Lvl 17.5 06/23/2019     No results found for: CYCLO  No results found for: EVEROLIMUS  No results found for: SIROLIMUS    Did not have Envarsus level drawn today    Graft function: SCr was elevated on 2/10 labs - has not had labs since  DSA: ntd  Biopsies to date: zero hour biopsy - mild acute tubular injury  UPC: 0.082 on 06/24/19  WBC/ANC:  wnl     Plan: Will maintain current immunosuppression until she can repeat labs on 3/22.  If WBC WNL consider increasing Myfortic to 540 BID. Marland KitchenContinue to monitor.    OI Prophylaxis:   CMV Status: D-/ R+, moderate risk. CMV prophylaxis: valganciclovir 450 mg daily x 3 months per protocol.  Complete  Lab Results   Component Value Date    CMV Quant CANCELED 07/25/2019    CMV Quant Negative 07/14/2019    CMV Quant Negative 07/08/2019    CMV Quant Negative 06/30/2019     PCP Prophylaxis: bactrim SS 1 tab MWF x 6 months.  Thrush: completed in hospital  Patient is  tolerating infectious prophylaxis well    Plan:  Continue to monitor.    CAD s/p PCI: asa 81 mg   The ASCVD Risk score Denman George DC Montez Hageman, et al., 2013) failed to calculate.  Statin therapy: Indicated given DM+ CAD history; atorvastatin 40 mg daily  Plan: Continue to monitor     BP: Goal < 140/90. Clinic vitals reported above  Home BP ranges:120-130/60-70 reports passed out x1 which she attributes to hypotension; also c/o drowsiness  Current meds include: amlodipine 10 mg, carvedilol 37.5 mg BID, clonidine 0.2 mg TID  Plan: decrease clonidine to 0.1 mg BID.  Continue to monitor    Anemia of CKD:  H/H:   Lab Results   Component Value Date    HGB 11.9 (L) 07/20/2019     Lab Results   Component Value Date    HCT 38.4 07/20/2019     Iron panel:  No results found for: IRON, TIBC, FERRITIN  No results found for: LABIRON    Prior ESA use: none post transplant    Plan: stable. Continue to monitor.     DM:   Lab Results   Component Value Date    A1C 4.9 05/20/2019   . Goal A1c < 7  History of Dm? Yes: T2DM  Established with endocrinologist/PCP for BG managment? Yes: PCP  Currently on: metformin XR 1g qAM and 500 mg qPM  BG log: FBG 140s  Diet:did not address  Exercise:not yet  Hypoglycemia: no  Plan:Will check A1C on next labs.  If needs additional coverage consider GLP-1 given CAD history    Electrolytes: wnl  Meds currently on: mag ox 400 mg BID, sodium bicarbonate 1300 mg BID (unclear if taking not in pill box)  Plan: Continue to monitor     GI/BM: pt occasional nausea that is much improved; no GERD or diarrhea  Meds currently on: pantoprazole 40 mg BID, Reglan 5 mg TID PRN (not using), lidocaine solution 15 mg q4h (rare use)  senna PRN, promethazine 25 mg q6h PRN (in pill box qAM),   Plan: Decrease pantoprazole to 40 mg daily.  Continue to monitor    Pain: pt reports moderate pain, chronic back pain still present but neuropathy worsening  Meds currently on: APAP PRN,  pregabalin 75 mg BID  Plan:Increase HS Lyrica dose to 150 mg. Continue to monitor    Bone health:   Vitamin D Level: 8.4 on 04/11/19. Goal > 30.   Last DEXA results:  none available  Current meds include: none  Plan: Vitamin D level  out of goal.  At next visit start ergocalciferol.  Continue to monitor.     Women's/Men's Health:  BEVERLEY ALLENDER is a 64 y.o. Female perimenopausal. Patient reports no men's/women's health issues  Plan: Continue to monitor    Appetite stimulant:  Meds currently on: mirtazapine 15 mg HS (no longer taking)  Plan: remove mirtazapine from list, continue to monitor    Pharmacy preference:  [facility]    Adherence: Patient has poor understanding of medications; was not able to independently identify names/doses of immunosuppressants and OI meds.  Patient  does fill their own pill box on a regular basis at home.  Patient brought medication card:yes  Pill ZOX:[id number] - only had carvedilol 12.5 mg in AM pockets; missing atorvastatinergocalciferol, and sodium bicarbonate  Plan: Encouraged patient to familiarize herself with meds - pt to fix pill box fill errors once she gets home; provided extensive adherence counseling/intervention    Spent approximately 40 minutes on educating this patient and greater than  50% was spent in direct face to face counseling regarding post transplant medication education. Questions and concerns were address to patient's satisfaction.    Patient was reviewed with Dr. Margaretmary Bayley who was agreement with the stated plan:     During this visit, the following was completed:   BG log data assessment  BP log data assessment  Labs ordered and evaluated  complex treatment plan >1 DS   Patient education was completed for 11-24 minutes     All questions/concerns were addressed to the patient's satisfaction.  __________________________________________  Cleone Slim, PHARMD, CPP  [employer] TRANSPLANT CLINICAL PHARMACIST PRACTITIONER  PAGER [phone]

## 2019-08-26 NOTE — Unmapped (Signed)
Transplant Nephrology Clinic Visit    Assessment/Plan:  Michele Cisneros is a 64 y.o. female s/p deceased donor kidney transplant on 03/30/2019 for native kidney ESRD presumed secondary to diabetic nephropathy. Active medical issues include:      S/P deceased donor kidney transplant with no history of rejection or DSAs and stable renal function.   - Serum creatinine 1.41 mg/dL on 1/61/09 (baseline range 0.0-1.4 mg/dL).  Will need repeat labs next week.   - DSA screens negative (most recently 05/20/19)   - UP/C has been normal  - BK viral load negative 07/20/19.        Immunosuppression Management   - Tacrolimus trough level  pending (target 6-10).   - Continue Envarsus XR 12 mg daily.   - Continue Myfortic 360 mg BID (reduced dose due to leukopenia and GI symptoms)       Hypertension, controlled, with recent postural syncope and fatigue   - BP 128/54 in clinic and <130/80 at home.   - Continue amlodipine 10 mg daily, carvedilol 37.5 mg bid, reduce clonidine to 0.1 mg bid from 0.2 mg tid due to drowsiness and syncope.     Coronary artery disease, s/p RCA stent 2015, history of HFpEF secondary to diastolic dysfunction/mitral regurgitation (moderate to severe)  - no symptoms of cardiac ischemia or CHF presently   - continue atorvastatin    Infectious Prophylaxis and Monitoring:   - CMV VL not detected 07/20/19  - BK VL not detected 07/20/19  - Plan 6 months of Bactrim (end 09/28/19) prophylaxis.     Pulmonary Nodule with History of Smoking Associated COPD:   - Subcentimeter nodule seen on NM Stress 07/07/17 and CT chest 05/02/19 stable from previous study 03/18/16 and 03/07/14  - Needs periodic low dose CT to screen for lung CA.     Lumbar Radiculopathy Secondary to L5-S1 disc disease and Peripheral Neuropathy secondary to DM   - Following with [facility]  - Continue on Lyrica.     Thyroid Nodule:   - [place] 06/16/17 showed right upper thyroid nodule.  Annual [place] recommended and is past due.     DM, type II:  -  Metformin 500mg  BID will be continued   - Hgb A1C 4.9 on 05/20/19.    Esophageal stenosis, history of esophageal ulcers:   - S/P dilation 07/06/19 with resolution of ulcers compared to 05/04/19 EGD    History of post-transplant UTI:   - UA 3.8/21 without findings of UTI, patient currently asymptomatic  - Will check urine culture    Health Maintenance:   Mammo: 03/04/18; f/u 1 year  Pap Smear: Hysterectomy  Colonoscopy: 08/20/2017; repeat 5 years due to multiple tubular adenomas  Lung cancer screening: due for low dose CT chest.    Immunizations:   Flu: 03/18/2019  Prevnar 13: not noted in records from 2012 and after.  Will get after 1 year post transplant  Pneumovax: 03/23/18  COVID-19: Patient does not presently wish to have this vaccine despite prolonged discussion today    Follow-Up: Return to clinic 10/05/19, labs weekly      History of Present Illness    64 y.o. female here for follow up after kidney transplantation.  Patient has ESRD secondary to DM II.  Patient has been on dialysis since 02/07/2014.  Patient's history includes HTN, CAD, CHF, HLD, COPD, PVD, sickle cell trait, and chronic back pain.     Patient was admitted for kidney transplant on 03/28/2019.  She received 1 unit pRBC  on 03/30/2019 and 04/03/2019.  Patient had delayed graft function after transplant, but no dialysis was needed. RUS on 03/30/2019 showed 1.8 cm hypoechoic fluid collection abutting the left lower pole of the transplant kidney likely representing postsurgical seroma.  Patient discharged on 04/06/2019 to her home.       Transplant History:    Native Kidney Disease: Diabetic nephropathy   Pre-transplant PRA: 70%   Organ Received: DDKT, DBD, KDPI: 70%  CMV/EBV Status: CMV D-/R+  EBV D+/R+  Resp culture: + strep pneumo  Date of Transplant: 03/30/2019  Implantation biopsy: mild ATN, otherwise normal.  Post-transplant complication - DGF  Induction: Campath with rapid steroid discontinuation  Maintenance Immunosuppression: Tacrolimus/Myfotic  Date of Ureteral Stent Removal: 06/13/2019  Current Immunosuppression: Tacrolimus/Myfortic  Results of Renal Imaging (pre and post):      Pre-Txp 07/07/17  -Kidneys are small with mildly increased echogenicity bilaterally. No solid masses or calculi in the visualized kidneys. No hydronephrosis    CT Abdomen/Pelvis w/o Contrast 03/29/2019 (pre-txp)  -1.2 cm hypodensity in the right kidney lower pole (2:48), increased in size from prior however incompletely characterized given lack of intravenous contrast.  -Uterus is surgically absent.    Post-Txp 05/08/2019 (txp kidney only)  --The renal transplant was located in the left lower quadrant. Normal size and echogenicity.  No solid masses or calculi. No perinephric collections identified. Double-J ureteral stent with proximal tip within the renal pelvis and distal tip within the bladder. Mild caliectasis. No hydronephrosis.  --Interval increase in resistive indices within the arcuate, segmental and main renal arteries measuring up to 0.98. Attention on follow-up.  --Improved perfusion in the upper pole of the transplant kidney, now with normal perfusion throughout the renal parenchyma.    Current Immunosuppression Regimen:   Envarsus 12 mg daily  Myfortic 360 mg BID      Interval History:   She had an episode of syncope since her last visit and feels fatigued. Blood pressure has been <140/90. She has not had documented postural hypotension. She also complains of drowsiness.  She denies nausea, vomiting or diarrhea, but does noted reflux symptoms. She has an EGD on 07/05/19 that showed benign-apperaring esophageal stenosis, dilated to 18 mm.  She was treated starting 07/11/19 for a presumed UTI with Cipro x 10 day.  She denies dysuria. She has persistent but improving tenderness over the superior portion of her allograft site. She has headaches particularly in the morning.      She continues to have neuropathy pain and lumbar radiculopathy associated LE pain.  Symptoms have not improved after Lyrica dose was increased from 75 mg bid to 75 mg AM/150 mg PM.     Other Past Medical History  1. Diabetes mellitus, type 2, diagnosed 2012.   2. Diabetic peripheral neuropathy  3. Diabetic retinopathy, s/p laser surgery, s/p retinal detachment  4. Hypertension, diagnosed 2012.   5. Mixed hyperlipidemia.   6. Coronary artery disease, s/p NSTEMI 05/2013, PCI RCA, 20% KNm 80% PDA 1. Cardiac cath 05/02/15 LAD 20% and 30% lesions, Cx 35%, RPDA 70%, patent right coronary stent  7. HFpEF with EF 65-70% on echo 03/29/19 with  moderate to severe MR  8. COPD, emphysema secondary to smoking  9. Peripheral artery disease  10. Carotid artery stenosis 1-39% bilateral stenosis 12/2107  10. Sickle Cell Trait  11. S/P TAH/BSO 2000  12. Lumbar radiculopathy secondary to L5-S1 disc bulge   13. History of thyroid nodule  14. Colonic polyps removed 08/10/17, pathology tubular adenomas,  follow up in 3 years  15. Right breast soft tissue mass noted 07/07/17 stable in appearance compared to past imaging  16. Pulmonary nodule noted 07/07/17 similar in size to lesion as far back as 2015.   17. OSA on CPAP    Review of Systems    Otherwise as per HPI, all other systems reviewed and are negative.    Medications  Current Outpatient Medications   Medication Sig Dispense Refill   ??? acetaminophen (TYLENOL) 500 MG tablet Take 1-2 tablets (500-1,000 mg total) by mouth every six (6) hours as needed for pain or fever (> 38C or 100.62F). 30 tablet 0   ??? albuterol (PROVENTIL HFA;VENTOLIN HFA) 90 mcg/actuation inhaler Inhale 2 puffs every six (6) hours as needed for wheezing.     ??? amLODIPine (NORVASC) 10 MG tablet Take 1 tablet (10 mg total) by mouth daily. 30 tablet 11   ??? aspirin (ECOTRIN) 81 MG tablet Take 81 mg by mouth daily.     ??? atorvastatin (LIPITOR) 40 MG tablet Take 1 tablet (40 mg total) by mouth daily. 30 tablet 11   ??? blood sugar diagnostic (CONTOUR TEST STRIPS) Strp by Other route two (2) times a day. TEST BLOOD SUGARS 2 TIMES DAILY 60 strip 5   ??? blood-glucose meter (ONETOUCH ULTRA SYSTEM KIT) kit Please dispense for patient to use at home to check blood sugars.     ??? carvediloL (COREG) 12.5 MG tablet Take 3 tablets (37.5 mg total) by mouth Two (2) times a day. 180 tablet 11   ??? cloNIDine HCL (CATAPRES) 0.1 MG tablet Take 2 tablets (0.2 mg total) by mouth every eight (8) hours. 180 tablet 11   ??? ergocalciferol (VITAMIN D2) 1,250 mcg (50,000 unit) capsule Take 1 capsule (50,000 Units total) by mouth once a week. Take for 8 weeks 4 capsule 2   ??? LANCETS,THIN MISC Using at home to test blood sugars twice daily.     ??? lidocaine 2% viscous (XYLOCAINE) 2 % Soln Use 15 mL by Mouth as directed Every four (4) hours. 2000 mL 5   ??? magnesium oxide (MAGOX) 400 mg (241.3 mg magnesium) tablet Take 1 tablet (400 mg total) by mouth Two (2) times a day. 120 tablet 11   ??? melatonin 3 mg Tab Take 2 tablets (6 mg total) by mouth nightly as needed.  0   ??? metFORMIN (GLUCOPHAGE XR) 500 MG 24 hr tablet Take 2 tablets (1,000 mg total) by mouth every morning AND 1 tablet (500 mg total) nightly. 90 tablet 11   ??? metoclopramide (REGLAN) 5 MG tablet Take 1 tablet (5 mg total) by mouth Three (3) times a day as needed (nausea). 90 tablet 1   ??? mirtazapine (REMERON) 15 MG tablet Take 1 tablet (15 mg total) by mouth nightly. 90 tablet 3   ??? mycophenolate (MYFORTIC) 180 MG EC tablet Take 2 tablets (360 mg total) by mouth Two (2) times a day. 120 tablet 11   ??? nitroglycerin (NITROSTAT) 0.4 MG SL tablet Place 0.4 mg under the tongue daily as needed.     ??? pregabalin (LYRICA) 75 MG capsule Take 1 capsule (75 mg total) by mouth Two (2) times a day. 60 capsule 3   ??? promethazine (PHENERGAN) 25 MG tablet Take 1 tablet (25 mg total) by mouth every six (6) hours as needed for up to 28 doses. 30 each 3   ??? sodium bicarbonate 650 mg tablet Take 1 tablet (650 mg total) by mouth Two (2)  times a day. 100 tablet 1   ??? sulfamethoxazole-trimethoprim (BACTRIM) 400-80 mg per tablet Take 1 tablet (80 mg of trimethoprim total) by mouth Every Monday, Wednesday, and Friday. 12 tablet 5   ??? tacrolimus (ENVARSUS XR) 4 mg Tb24 extended release tablet Take 3 tablets (12 mg total) by mouth every morning. 90 tablet 11     No current facility-administered medications for this visit.            Physical Exam  BP 128/54 (BP Site: R Arm, BP Position: Sitting, BP Cuff Size: Medium)  - Pulse 74  - Temp 36.4 ??C (97.6 ??F) (Temporal)  - Ht 170.2 cm (5' 7)  - Wt 66.8 kg (147 lb 3.2 oz)  - BMI 23.05 kg/m??   General: no acute distress  HEENT: anicteric  Neck: neck supple, no cervical lymphadenopathy appreciated  CV: grade 2/6 systolic murmur  Lungs: clear to auscultation bilaterally  Abdomen: soft, tender with palpation over graft site;  Incision healed; no drainage or odor noted; no erythema;   Extremities: 1+ LE edema  Neurologic: awake, alert, and oriented x3      Laboratory Data  No results found for this or any previous visit (from the past 170 hour(s)).

## 2019-08-26 NOTE — Unmapped (Signed)
[facility] Refill Coordination Note    Specialty Medication(s) to be Shipped:   Transplant: mycophenolate mofetil 180mg   Other medication(s) to be shipped:   Sulfa-Trime 400-80mg  & Atorvastatin 40mg      Michele Cisneros, DOB: [birth year]  Phone: [phone] (home)     All above HIPAA information was verified with patient.     Was a Nurse, learning disability used for this call? No    Completed refill call assessment today to schedule patient's medication shipment from the [facility] [phone]).       Specialty medication(s) and dose(s) confirmed: Regimen is correct and unchanged.   Changes to medications: Michele Cisneros reports no changes at this time.  Changes to insurance: No  Questions for the pharmacist: No    Confirmed patient received Welcome Packet with first shipment. The patient will receive a drug information handout for each medication shipped and additional FDA Medication Guides as required.       DISEASE/MEDICATION-SPECIFIC INFORMATION        N/A    SPECIALTY MEDICATION ADHERENCE     Medication Adherence    Patient reported X missed doses in the last month: 0  Specialty Medication: Mycophenolate 180mg   Patient is on additional specialty medications: No  Informant: patient  Reliability of informant: reliable        Mycophenolate 180 mg: 7 days of medicine on hand     SHIPPING     Shipping address confirmed in Epic.     Delivery Scheduled: Yes, Expected medication delivery date: 08/30/2019.     Medication will be delivered via UPS to the prescription address in Epic [facility].    Michele Cisneros Shared [facility] Specialty Technician

## 2019-08-26 NOTE — Unmapped (Signed)
Not sleeping well. Having trouble staying and falling asleep    Eating well    Drinking 4-5 bottles of water a day    BP 130-140/70-80    Reflux is better and not having any abdominal pain. Only has taken the lidocaine once.    HA in the mornings and takes tylenol.    Tremors are better    Reports numbness and tingling in legs    Denies n/v/d, CP, SOB, urinary problems, swelling, fever, and heart arrythmias

## 2019-08-26 NOTE — Unmapped (Signed)
Printed [facility] orders

## 2019-08-29 DIAGNOSIS — Z79899 Other long term (current) drug therapy: Principal | ICD-10-CM

## 2019-08-29 DIAGNOSIS — Z940 Kidney transplant status: Principal | ICD-10-CM

## 2019-08-29 DIAGNOSIS — D899 Disorder involving the immune mechanism, unspecified: Secondary | ICD-10-CM | POA: Diagnosis not present

## 2019-08-29 MED FILL — ATORVASTATIN 40 MG TABLET: 30 days supply | Qty: 30 | Fill #1 | Status: AC

## 2019-08-29 MED FILL — ATORVASTATIN 40 MG TABLET: ORAL | 30 days supply | Qty: 30 | Fill #1

## 2019-08-29 MED FILL — SULFAMETHOXAZOLE 400 MG-TRIMETHOPRIM 80 MG TABLET: ORAL | 28 days supply | Qty: 12 | Fill #4

## 2019-08-29 MED FILL — MYCOPHENOLATE SODIUM 180 MG TABLET,DELAYED RELEASE: 30 days supply | Qty: 120 | Fill #1 | Status: AC

## 2019-08-29 MED FILL — MYCOPHENOLATE SODIUM 180 MG TABLET,DELAYED RELEASE: ORAL | 30 days supply | Qty: 120 | Fill #1

## 2019-08-29 MED FILL — SULFAMETHOXAZOLE 400 MG-TRIMETHOPRIM 80 MG TABLET: 28 days supply | Qty: 12 | Fill #4 | Status: AC

## 2019-09-05 DIAGNOSIS — Z940 Kidney transplant status: Principal | ICD-10-CM

## 2019-09-05 DIAGNOSIS — Z79899 Other long term (current) drug therapy: Principal | ICD-10-CM

## 2019-09-08 DIAGNOSIS — D899 Disorder involving the immune mechanism, unspecified: Secondary | ICD-10-CM | POA: Diagnosis not present

## 2019-09-08 DIAGNOSIS — B259 Cytomegaloviral disease, unspecified: Secondary | ICD-10-CM | POA: Diagnosis not present

## 2019-09-08 DIAGNOSIS — Z79899 Other long term (current) drug therapy: Secondary | ICD-10-CM | POA: Diagnosis not present

## 2019-09-08 DIAGNOSIS — Z940 Kidney transplant status: Secondary | ICD-10-CM | POA: Diagnosis not present

## 2019-09-08 DIAGNOSIS — D631 Anemia in chronic kidney disease: Secondary | ICD-10-CM | POA: Diagnosis not present

## 2019-09-08 DIAGNOSIS — T8610 Unspecified complication of kidney transplant: Secondary | ICD-10-CM | POA: Diagnosis not present

## 2019-09-08 DIAGNOSIS — N390 Urinary tract infection, site not specified: Secondary | ICD-10-CM | POA: Diagnosis not present

## 2019-09-08 DIAGNOSIS — E1129 Type 2 diabetes mellitus with other diabetic kidney complication: Secondary | ICD-10-CM | POA: Diagnosis not present

## 2019-09-08 DIAGNOSIS — Z114 Encounter for screening for human immunodeficiency virus [HIV]: Secondary | ICD-10-CM | POA: Diagnosis not present

## 2019-09-08 DIAGNOSIS — E559 Vitamin D deficiency, unspecified: Secondary | ICD-10-CM | POA: Diagnosis not present

## 2019-09-08 DIAGNOSIS — Z09 Encounter for follow-up examination after completed treatment for conditions other than malignant neoplasm: Secondary | ICD-10-CM | POA: Diagnosis not present

## 2019-09-12 DIAGNOSIS — Z940 Kidney transplant status: Principal | ICD-10-CM

## 2019-09-12 DIAGNOSIS — Z79899 Other long term (current) drug therapy: Principal | ICD-10-CM

## 2019-09-12 DIAGNOSIS — Z789 Other specified health status: Principal | ICD-10-CM

## 2019-09-12 LAB — CBC W/ DIFFERENTIAL
BASOPHILS ABSOLUTE COUNT: 0.1 10*9/L
BASOPHILS RELATIVE PERCENT: 4 %
EOSINOPHILS RELATIVE PERCENT: 5 %
HEMATOCRIT: 32.5 % — ABNORMAL LOW
HEMOGLOBIN: 10.8 g/dL — ABNORMAL LOW
LYMPHOCYTES ABSOLUTE COUNT: 0.2 10*9/L — ABNORMAL LOW
LYMPHOCYTES RELATIVE PERCENT: 12 %
MEAN CORPUSCULAR VOLUME: 89 fL
MONOCYTES ABSOLUTE COUNT: 0.2 10*9/L
MONOCYTES RELATIVE PERCENT: 11 %
NEUTROPHILS ABSOLUTE COUNT: 1.1 10*9/L — ABNORMAL LOW
NEUTROPHILS RELATIVE PERCENT: 68 %
PLATELET COUNT: 172 10*9/L
RED BLOOD CELL COUNT: 3.46 10*12/L — ABNORMAL LOW
RED CELL DISTRIBUTION WIDTH: 15.5 % — ABNORMAL HIGH
WHITE BLOOD CELL COUNT: 1.6 10*9/L

## 2019-09-12 LAB — BASIC METABOLIC PANEL
CALCIUM: 10.6 mg/dL — ABNORMAL HIGH
CHLORIDE: 105 mmol/L
CO2: 17 mmol/L — ABNORMAL LOW
CREATININE: 1.19 mg/dL — ABNORMAL HIGH
GLUCOSE RANDOM: 110 mg/dL — ABNORMAL HIGH
SODIUM: 137 mmol/L

## 2019-09-12 LAB — CHLORIDE: Lab: 105

## 2019-09-12 LAB — MAGNESIUM: Lab: 1.7

## 2019-09-12 LAB — PHOSPHORUS: Lab: 3.8

## 2019-09-12 LAB — TACROLIMUS, TROUGH: Lab: 14.2

## 2019-09-12 LAB — HYPOCHROMIA: Lab: 0

## 2019-09-12 MED ORDER — MYCOPHENOLATE SODIUM 180 MG TABLET,DELAYED RELEASE
ORAL_TABLET | 11 refills | 0 days
Start: 2019-09-12 — End: ?

## 2019-09-12 NOTE — Unmapped (Signed)
After review of recent labs from 4/01 with Dr. Margaretmary Bayley and decreasing WBC and Neutrophils    Per his orders the following:     Check CMV viral load. - orders in with [facility] and patient aware this needs to be drawn   Hold Myforitc. Noted on Rx    Stop Bactrim- updated Rx, discontinued Rx    Pt. Reporting recent episode of diarrhea as of 11pm Sunday night.   Denies any fevers, she reported eating potato salad and she reports it usually causes her to have an upset stomach   Will repeat labs Thursday 4/8     Drue Flirt, RN Transplant Nurse Coordinator 09/12/2019 2:02 PM

## 2019-09-13 MED FILL — CARVEDILOL 12.5 MG TABLET: ORAL | 30 days supply | Qty: 180 | Fill #4

## 2019-09-13 MED FILL — METFORMIN ER 500 MG TABLET,EXTENDED RELEASE 24 HR: 30 days supply | Qty: 90 | Fill #2 | Status: AC

## 2019-09-13 MED FILL — CARVEDILOL 12.5 MG TABLET: 30 days supply | Qty: 180 | Fill #4 | Status: AC

## 2019-09-13 MED FILL — PREGABALIN 75 MG CAPSULE: 30 days supply | Qty: 90 | Fill #0 | Status: AC

## 2019-09-13 MED FILL — METFORMIN ER 500 MG TABLET,EXTENDED RELEASE 24 HR: ORAL | 30 days supply | Qty: 90 | Fill #2

## 2019-09-14 DIAGNOSIS — Z789 Other specified health status: Principal | ICD-10-CM

## 2019-09-14 NOTE — Unmapped (Addendum)
This covering coordinator called patient to check in a remind her to get labs Thursday 4-8   She verbalizes understanding and confirmed her recent tacrolimus trough was a true trough. Notified provider.     No other complaints from the patient at this time.     Drue Flirt, RN  Transplant Nurse Coordinator 09/15/2019 10:21 PM

## 2019-09-15 DIAGNOSIS — Z789 Other specified health status: Secondary | ICD-10-CM | POA: Diagnosis not present

## 2019-09-16 DIAGNOSIS — Z940 Kidney transplant status: Principal | ICD-10-CM

## 2019-09-16 DIAGNOSIS — Z79899 Other long term (current) drug therapy: Principal | ICD-10-CM

## 2019-09-16 NOTE — Unmapped (Addendum)
Called patient to check in to see is she got labs Thursday as instructed.   Per [facility] patient only had CMV drawn.  I called [facility] and they could not add on test. They said they didn't have standing [facility] orders.   This covering coordinator placed [facility] orders.   Instructed the patient to get her full standard labs on Monday.   CMV pending.   Dr. Margaretmary Bayley aware that full standard labs were not drawn today as instructed.   Patient has no complaints at this time. I also instructed patient should call the on-call if she every comes into a issue with the lab not having orders to draw labs.   Pt verbalizes understanding.     Drue Flirt, RN Transplant Nurse Coordinator 09/16/2019 5:52 PM

## 2019-09-17 LAB — LOG10 CMV QN DNA PL: Cytomegalovirus DNA:LaCnc:Pt:Plas:Qn:Probe.amp.tar: 2.696

## 2019-09-17 LAB — CMV DNA, QUANTITATIVE, PCR: CMV QUANT: 497 [IU]/mL

## 2019-09-19 DIAGNOSIS — Z940 Kidney transplant status: Principal | ICD-10-CM

## 2019-09-19 DIAGNOSIS — Z79899 Other long term (current) drug therapy: Principal | ICD-10-CM

## 2019-09-19 NOTE — Unmapped (Signed)
[facility] Clinical Assessment & Refill Coordination Note    Michele Cisneros, DOB: 06/12/1955  Phone: [phone] (home)     All above HIPAA information was verified with patient.     Was a Nurse, learning disability used for this call? No    Specialty Medication(s):   Transplant: Envarsus 4mg  and  mycophenolic acid 180mg      Current Outpatient Medications   Medication Sig Dispense Refill   ??? acetaminophen (TYLENOL) 500 MG tablet Take 1-2 tablets (500-1,000 mg total) by mouth every six (6) hours as needed for pain or fever (> 38C or 100.45F). 30 tablet 0   ??? albuterol (PROVENTIL HFA;VENTOLIN HFA) 90 mcg/actuation inhaler Inhale 2 puffs every six (6) hours as needed for wheezing.     ??? amLODIPine (NORVASC) 10 MG tablet Take 1 tablet (10 mg total) by mouth daily. 30 tablet 11   ??? aspirin (ECOTRIN) 81 MG tablet Take 81 mg by mouth daily.     ??? atorvastatin (LIPITOR) 40 MG tablet Take 1 tablet (40 mg total) by mouth daily. 30 tablet 11   ??? blood sugar diagnostic (CONTOUR TEST STRIPS) Strp by Other route two (2) times a day. TEST BLOOD SUGARS 2 TIMES DAILY 60 strip 5   ??? blood-glucose meter (ONETOUCH ULTRA SYSTEM KIT) kit Please dispense for patient to use at home to check blood sugars.     ??? carvediloL (COREG) 12.5 MG tablet Take 3 tablets (37.5 mg total) by mouth Two (2) times a day. 180 tablet 11   ??? cloNIDine HCL (CATAPRES) 0.1 MG tablet Take 1 tablet (0.1 mg total) by mouth Two (2) times a day. 60 tablet 11   ??? ergocalciferol (VITAMIN D2) 1,250 mcg (50,000 unit) capsule Take 1 capsule (50,000 Units total) by mouth once a week. Take for 8 weeks 4 capsule 2   ??? LANCETS,THIN MISC Using at home to test blood sugars twice daily.     ??? magnesium oxide (MAGOX) 400 mg (241.3 mg magnesium) tablet Take 1 tablet (400 mg total) by mouth Two (2) times a day. 120 tablet 11   ??? melatonin 3 mg Tab Take 2 tablets (6 mg total) by mouth nightly as needed.  0   ??? metFORMIN (GLUCOPHAGE XR) 500 MG 24 hr tablet Take 2 tablets (1,000 mg total) by mouth every morning AND 1 tablet (500 mg total) nightly. 90 tablet 11   ??? metoclopramide (REGLAN) 5 MG tablet Take 1 tablet (5 mg total) by mouth Three (3) times a day as needed (nausea). 90 tablet 1   ??? mycophenolate (MYFORTIC) 180 MG EC tablet HOLD 120 tablet 11   ??? nitroglycerin (NITROSTAT) 0.4 MG SL tablet Place 0.4 mg under the tongue daily as needed.     ??? pregabalin (LYRICA) 75 MG capsule Take 1 capsule (75 mg total) by mouth every morning AND 2 capsules (150 mg total) nightly. 90 capsule 3   ??? sodium bicarbonate 650 mg tablet Take 1 tablet (650 mg total) by mouth Two (2) times a day. 100 tablet 1   ??? tacrolimus (ENVARSUS XR) 4 mg Tb24 extended release tablet Take 3 tablets (12 mg total) by mouth every morning. 90 tablet 11     No current facility-administered medications for this visit.        Changes to medications: see mycophen below    Allergies   Allergen Reactions   ??? Hydrocodone Hives   ??? Lisinopril      Unknown reaction    ???  Perfume        Changes to allergies: No    SPECIALTY MEDICATION ADHERENCE     Mycophenolate 10mg   : 15 days of medicine on hand - currently not taking  envarsus 4mg   : 8 days of medicine on hand     Medication Adherence    Patient reported X missed doses in the last month: 0  Specialty Medication: Envarsus XR 4 mg  Patient is on additional specialty medications: Yes  Additional Specialty Medications: Mycophenolate 180mg   Patient Reported Additional Medication X Missed Doses in the Last Month: 0  Informant: patient  Confirmed plan for next specialty medication refill: delivery by pharmacy  Refills needed for supportive medications: not needed          Specialty medication(s) dose(s) confirmed: Patient reports changes to the regimen as follows: holding mycophen, not taking at this time     Are there any concerns with adherence? No    Adherence counseling provided? Not needed    CLINICAL MANAGEMENT AND INTERVENTION      Clinical Benefit Assessment:    Do you feel the medicine is effective or helping your condition? Yes    Clinical Benefit counseling provided? Not needed    Adverse Effects Assessment:    Are you experiencing any side effects? No    Are you experiencing difficulty administering your medicine? No    Quality of Life Assessment:    How many days over the past month did your transplant  keep you from your normal activities? For example, brushing your teeth or getting up in the morning. 0    Have you discussed this with your provider? Not needed    Therapy Appropriateness:    Is therapy appropriate? Yes, therapy is appropriate and should be continued    DISEASE/MEDICATION-SPECIFIC INFORMATION      N/A    PATIENT SPECIFIC NEEDS     - Does the patient have any physical, cognitive, or cultural barriers? No    - Is the patient high risk? Yes, patient is taking a REMS drug. Medication is dispensed in compliance with REMS program. however this med is currently on hold    - Does the patient require a Care Management Plan? No     - Does the patient require physician intervention or other additional services (i.e. nutrition, smoking cessation, social work)? No      SHIPPING     Specialty Medication(s) to be Shipped:   Transplant: Envarsus 4mg     Other medication(s) to be shipped: norvasc, lipitor     Changes to insurance: No    Delivery Scheduled: Yes, Expected medication delivery date: 09/23/2019.     Medication will be delivered via Next Day Courier to the confirmed prescription address in [facility].    The patient will receive a drug information handout for each medication shipped and additional FDA Medication Guides as required.  Verified that patient has previously received a Conservation officer, historic buildings.    All of the patient's questions and concerns have been addressed.    Thad Ranger   [facility] Specialty Pharmacist

## 2019-09-22 ENCOUNTER — Encounter (INDEPENDENT_AMBULATORY_CARE_PROVIDER_SITE_OTHER): Payer: Self-pay | Admitting: Vascular Surgery

## 2019-09-22 ENCOUNTER — Ambulatory Visit (INDEPENDENT_AMBULATORY_CARE_PROVIDER_SITE_OTHER): Payer: Medicare Other

## 2019-09-22 ENCOUNTER — Ambulatory Visit (INDEPENDENT_AMBULATORY_CARE_PROVIDER_SITE_OTHER): Payer: Medicare Other | Admitting: Vascular Surgery

## 2019-09-22 ENCOUNTER — Other Ambulatory Visit: Payer: Self-pay

## 2019-09-22 VITALS — BP 105/56 | HR 74 | Resp 16 | Wt 141.2 lb

## 2019-09-22 DIAGNOSIS — E1142 Type 2 diabetes mellitus with diabetic polyneuropathy: Secondary | ICD-10-CM | POA: Diagnosis not present

## 2019-09-22 DIAGNOSIS — Z992 Dependence on renal dialysis: Secondary | ICD-10-CM | POA: Diagnosis not present

## 2019-09-22 DIAGNOSIS — N186 End stage renal disease: Secondary | ICD-10-CM | POA: Diagnosis not present

## 2019-09-22 DIAGNOSIS — I10 Essential (primary) hypertension: Secondary | ICD-10-CM

## 2019-09-22 DIAGNOSIS — J432 Centrilobular emphysema: Secondary | ICD-10-CM | POA: Diagnosis not present

## 2019-09-22 DIAGNOSIS — T829XXS Unspecified complication of cardiac and vascular prosthetic device, implant and graft, sequela: Secondary | ICD-10-CM | POA: Diagnosis not present

## 2019-09-22 MED FILL — AMLODIPINE 10 MG TABLET: 30 days supply | Qty: 30 | Fill #4 | Status: AC

## 2019-09-22 MED FILL — ATORVASTATIN 40 MG TABLET: ORAL | 30 days supply | Qty: 30 | Fill #2

## 2019-09-22 MED FILL — ENVARSUS XR 4 MG TABLET,EXTENDED RELEASE: 30 days supply | Qty: 90 | Fill #1 | Status: AC

## 2019-09-22 MED FILL — AMLODIPINE 10 MG TABLET: ORAL | 30 days supply | Qty: 30 | Fill #4

## 2019-09-22 MED FILL — ENVARSUS XR 4 MG TABLET,EXTENDED RELEASE: ORAL | 30 days supply | Qty: 90 | Fill #1

## 2019-09-22 MED FILL — ATORVASTATIN 40 MG TABLET: 30 days supply | Qty: 30 | Fill #2 | Status: AC

## 2019-09-22 NOTE — Progress Notes (Signed)
MRN : [id number]  Theresa [name] is a 64 y.o. ([birth year]) female who presents with chief complaint of  Chief Complaint  Patient presents with  . Follow-up    ultrasound follow up  .  History of Present Illness:   The patient returns to the office for followup status post remote intervention of the dialysis access left forearm loop graft. Since her last visit she has received a kidney transplant.  The patient denies amaurosis fugax or recent TIA symptoms. There are no recent neurological changes noted. The patient denies claudication symptoms or rest pain symptoms. The patient denies history of DVT, PE or superficial thrombophlebitis. The patient denies recent episodes of angina or shortness of breath.   The patient has a flow volume of 1133, increased from the previous flow volume of 2000.  There is a change in diameter at the mid graft however it is improved from previous studies.  The left upper arm loop AV graft appears to be patent throughout.  Current Meds  Medication Sig  . albuterol (PROVENTIL) (2.5 MG/3ML) 0.083% nebulizer solution Take 3 mLs (2.5 mg total) by nebulization every 6 (six) hours as needed for wheezing or shortness of breath.  Marland Kitchen albuterol (VENTOLIN HFA) 108 (90 Base) MCG/ACT inhaler Inhale 1-2 puffs into the lungs every 6 (six) hours as needed for wheezing or shortness of breath.  Marland Kitchen amLODipine (NORVASC) 10 MG tablet Take 10 mg by mouth daily.   Marland Kitchen aspirin EC 81 MG tablet Take 1 tablet (81 mg total) by mouth at bedtime.  Marland Kitchen atorvastatin (LIPITOR) 40 MG tablet Take 40 mg by mouth daily at 6 PM.  . AURYXIA 1 GM 210 MG(Fe) tablet Take 210-630 mg by mouth 3 (three) times daily with meals. Take 630 mg with each meal and 210 mg with each snack  . carvedilol (COREG) 25 MG tablet Take 1.5 tablets (37.5 mg total) by mouth 2 (two) times daily with a meal.  . cetirizine (ZYRTEC) 5 MG tablet Take 1 tablet (5 mg total) by mouth daily as needed for allergies. (Patient taking  differently: Take 5 mg by mouth daily. )  . CHELATED MAGNESIUM PO Take 133 mg by mouth 2 (two) times daily.  . Cholecalciferol 1.25 MG (50000 UT) TABS Take by mouth once a week. X 8 weeks  . cloNIDine (CATAPRES) 0.2 MG tablet Take 1 tablet (0.2 mg total) by mouth 3 (three) times daily.  Marland Kitchen donepezil (ARICEPT) 5 MG tablet Take 5 mg by mouth daily.  . isosorbide mononitrate (IMDUR) 60 MG 24 hr tablet Take 1 tablet (60 mg total) by mouth daily.  Marland Kitchen losartan (COZAAR) 50 MG tablet Take 50 mg by mouth daily.   . megestrol (MEGACE) 20 MG tablet Take 10 mg by mouth daily. Dr. Inocente Salles  . metFORMIN (GLUCOPHAGE) 500 MG tablet Take by mouth 2 (two) times daily with a meal.  . mirtazapine (REMERON) 15 MG tablet Take 15 mg by mouth at bedtime.  . multivitamin (RENA-VIT) TABS tablet Take 1 tablet by mouth daily.  . mycophenolate (MYFORTIC) 360 MG TBEC EC tablet Take 360 mg by mouth 2 (two) times daily.  . nitroGLYCERIN (NITROSTAT) 0.4 MG SL tablet Place 1 tablet (0.4 mg total) under the tongue every 5 (five) minutes as needed for chest pain.  . pantoprazole (PROTONIX) 40 MG tablet Take 40 mg by mouth 2 (two) times daily.  . pregabalin (LYRICA) 75 MG capsule Take 1 capsule (75 mg total) by mouth 2 (two) times daily.  Marland Kitchen  senna (SENOKOT) 8.6 MG TABS tablet Take 1 tablet by mouth at bedtime as needed for mild constipation.  . sucralfate (CARAFATE) 1 GM/10ML suspension Take 1 g by mouth 4 (four) times daily -  with meals and at bedtime. 10 ml Q6 hrs  . sulfamethoxazole-trimethoprim (BACTRIM) 400-80 MG tablet Take 1 tablet by mouth every Monday, Wednesday, and Friday. Stop date 09/2019  . Tacrolimus ER (ENVARSUS XR) 1 MG TB24 Take 2 tablets by mouth every morning.  . Tacrolimus ER 4 MG TB24 Take 2 tablets by mouth every morning. Total dose 14 mg daily in am  . UNABLE TO FIND TEST BLOOD SUGARS 2 TIMES DAILY  . valGANciclovir (VALCYTE) 450 MG tablet Take 450 mg by mouth daily.    Past Medical History:  Diagnosis Date    . Anemia of chronic disease   . Anginal pain ([place])   . Carotid arterial disease ([place])    a. 02/2013 U/S: 40-59% bilat ICA stenosis.  . Chronic constipation   . Chronic diastolic CHF (congestive heart failure) ([place])    a. 10/2013 Echo [facility]): EF 55-60%, mod conc LVH, mod MR, mildly dil LA, mild Ao sclerosis w/o stenosis; b. 02/2016 Echo: EF 55-60%, no rwma, Gr DD, mild AS, mod to sev MR, mildly dil LA, PASP 20mmHg; c. 07/2017 Echo [facility]): EF >55%, mild to mod LVH, Gr2 DD, Ao scl, Mod MR, mod dil LA, nl RV fxn.  . Colon polyps   . COPD (chronic obstructive pulmonary disease) ([place])   . Coronary artery disease    a. 05/2013 NSTEMI/PCI: LM 20d, LAD min irregs, LCX small, nl, OM1 nl, RCA dom 5m (2.5x16 Promus DES), PDA1 80p; b. 04/2015 Cath: LM nl, LAD 18m, D1/2 min irregs, LCX 35p/m, OM2/3 min irregs, RCA patent mid stent, RPDA 70ost, RPLB1 30, RPLB2/3 min irregs, EF 55-65%--> Med Rx; c. 06/2017 [facility]): No ischemia/infarct, EF 64%.  . Diabetes mellitus   . Diabetic neuropathy ([place])   . Diabetic retinopathy ([place]) 05/28/2013   Hx bilat retinal detachment, proliferative diab retinopathy and bilat vitreous hemorrhage   . Emphysema   . ESRD on hemodialysis ([place])    a. [facility] in [place], [place] [phone]/Dr. [name], on a MWF schedule.  She started dialysis in Feb 2014.  Etiology of renal failure not known, likely diabetes.  Has a left upper arm AV graft.  . History of bronchitis    Mar 2012  . History of pneumonia    June 2012  . History of tobacco abuse    a. Quit 2012.  Marland Kitchen Hyperlipidemia   . Hypertension   . Moderate to severe mitral insufficiency    a. 10/2013 Echo: EF 55-60%, mod MR; b. 02/2016 Echo: EF 55-60%, mod to sev MR directed posteriorly--felt to be dynamic -worse with volume overload; c. 07/2017 Echo [facility]): EF >55%. Mod MR.  . Myocardial infarct ([place]) 05/2013  . Peripheral vascular disease ([place])   . Sickle cell trait ([place])   . Thyroid nodule    [place] 06/2017 due to f/u US in 1 year      Past Surgical History:  Procedure Laterality Date  . A/V FISTULAGRAM Left 11/10/2017   Procedure: A/V FISTULAGRAM;  Surgeon: Katha Cabal, MD;  Location: [place] CV [facility];  Service: Cardiovascular;  Laterality: Left;  . A/V SHUNTOGRAM Left 02/08/2019   Procedure: A/V SHUNTOGRAM;  Surgeon: Katha Cabal, MD;  Location: [place] [facility];  Service: Cardiovascular;  Laterality: Left;  . ABDOMINAL HYSTERECTOMY  2000  . CARDIAC CATHETERIZATION    . CARDIAC CATHETERIZATION N/A 05/02/2015   Procedure: Left Heart Cath and Coronary Angiography;  Surgeon: [name], MD;  Location: [place] [facility];  Service: Cardiovascular;  Laterality: N/A;  . COLONOSCOPY WITH PROPOFOL N/A 07/08/2016   Procedure: COLONOSCOPY WITH PROPOFOL;  Surgeon: [name], MD;  Location: [facility] ENDOSCOPY;  Service: Endoscopy;  Laterality: N/A;  . COLONOSCOPY WITH PROPOFOL N/A 08/20/2017   Procedure: COLONOSCOPY WITH PROPOFOL;  Surgeon: [name], MD;  Location: [facility] ENDOSCOPY;  Service: Gastroenterology;  Laterality: N/A;  . colonscopy    . CORONARY ANGIOPLASTY  05/28/2014   stent placement to the mid RCA  . [facility] with stent placement 11 or 05/2019 and stent removal 06/13/19   . DILATION AND CURETTAGE OF UTERUS     several in the early 80's  . ESOPHAGOGASTRODUODENOSCOPY     2012  . ESOPHAGOGASTRODUODENOSCOPY (EGD) WITH PROPOFOL N/A 03/11/2018   Procedure: ESOPHAGOGASTRODUODENOSCOPY (EGD) WITH PROPOFOL;  Surgeon: [name], MD;  Location: [facility] ENDOSCOPY;  Service: Gastroenterology;  Laterality: N/A;  . EYE SURGERY     bilateral laser 2012  . EYE SURGERY     right  . EYE SURGERY     x4 both eyes  . GAS INSERTION  09/30/2011   Procedure: INSERTION OF GAS;  Surgeon: [name], MD;  Location: [place];  Service: Ophthalmology;  Laterality: Right;  [id number]  . GAS/FLUID EXCHANGE  09/30/2011   Procedure: GAS/FLUID EXCHANGE;  Surgeon: [name], MD;  Location: [place];  Service: Ophthalmology;  Laterality: Right;  . KIDNEY TRANSPLANT     03/30/19 Dr. Mickel Baas Thomas/Dr. Cristie Hem Zendel/Dr. Gilford Raid; [facility] Dr. Horald Chestnut will be primary [phone] coordinator Sarah Wynkoop [phone]  . KIDNEY TRANSPLANT     03/30/19 [facility]  . LEFT HEART CATHETERIZATION WITH CORONARY ANGIOGRAM N/A 05/28/2013   Procedure: LEFT HEART CATHETERIZATION WITH CORONARY ANGIOGRAM;  Surgeon: [name], MD;  Location: [facility] CATH LAB;  Service: Cardiovascular;  Laterality: N/A;  . PARS PLANA VITRECTOMY  04/22/2011   Procedure: PARS PLANA VITRECTOMY WITH 25 GAUGE;  Surgeon: [name], MD;  Location: [place];  Service: Ophthalmology;  Laterality: Left;  membrane peel, endolaser, gas fluid exchange, silicone oil, repair of complex traction retinal detachment  . PARS PLANA VITRECTOMY  09/30/2011   Procedure: PARS PLANA VITRECTOMY WITH 25 GAUGE;  Surgeon: [name], MD;  Location: [place];  Service: Ophthalmology;  Laterality: Right;  Endolaser; Repair of Complex Traction Retinal Detachment  . PARS PLANA VITRECTOMY  02/24/2012   Procedure: PARS PLANA VITRECTOMY WITH 25 GAUGE;  Surgeon: [name], MD;  Location: [place];  Service: Ophthalmology;  Laterality: Left;  . PTCA    . SILICON OIL REMOVAL  9/45/0388   Procedure: SILICON OIL REMOVAL;  Surgeon: [name], MD;  Location: [place];  Service: Ophthalmology;  Laterality: Left;  . THROMBECTOMY / ARTERIOVENOUS GRAFT REVISION    . TUBAL LIGATION     1979  . UPPER EXTREMITY ANGIOGRAPHY Left 02/08/2019   Procedure: UPPER EXTREMITY ANGIOGRAPHY;  Surgeon: Katha Cabal, MD;  Location: [place] [facility];  Service: Cardiovascular;  Laterality: Left;    Social History Social History   Tobacco Use  . Smoking status: Former Smoker    Packs/day: 1.00    Years: 25.00    Pack years: 25.00    Quit date: 06/09/2010  Years since quitting: 9.2  . Smokeless tobacco: Never Used  Substance Use Topics  . Alcohol use:  No  . Drug use: No    Family History Family History  Problem Relation Age of Onset  . Stroke Mother   . Heart attack Mother   . Heart disease Mother   . Colon cancer Father   . Colon cancer Sister   . Heart attack Brother   . Stroke Brother   . Diabetes Brother   . Breast cancer Sister   . Diabetes Brother   . Diabetes Daughter   . Renal Disease Daughter        on HD  . Anesthesia problems Neg Hx   . Hypotension Neg Hx   . Malignant hyperthermia Neg Hx   . Pseudochol deficiency Neg Hx     Allergies  Allergen Reactions  . Hydrocodone Hives  . Metformin Diarrhea and Other (See Comments)    Other reaction(s): Distress (finding)  . Lisinopril     Unknown reaction      REVIEW OF SYSTEMS (Negative unless checked)  Constitutional: [] Weight loss  [] Fever  [] Chills Cardiac: [] Chest pain   [] Chest pressure   [] Palpitations   [] Shortness of breath when laying flat   [] Shortness of breath with exertion. Vascular:  [] Pain in legs with walking   [] Pain in legs at rest  [] History of DVT   [] Phlebitis   [] Swelling in legs   [] Varicose veins   [] Non-healing ulcers Pulmonary:   [] Uses home oxygen   [] Productive cough   [] Hemoptysis   [] Wheeze  [] COPD   [] Asthma Neurologic:  [] Dizziness   [] Seizures   [] History of stroke   [] History of TIA  [] Aphasia   [] Vissual changes   [] Weakness or numbness in arm   [] Weakness or numbness in leg Musculoskeletal:   [] Joint swelling   [] Joint pain   [] Low back pain Hematologic:  [] Easy bruising  [] Easy bleeding   [] Hypercoagulable state   [] Anemic Gastrointestinal:  [] Diarrhea   [] Vomiting  [] Gastroesophageal reflux/heartburn   [] Difficulty swallowing. Genitourinary:  [x] Chronic kidney disease   [] Difficult urination  [] Frequent urination   [] Blood in urine Skin:  [] Rashes   [] Ulcers  Psychological:  [] History of anxiety   []  History of major depression.  Physical Examination  Vitals:   09/22/19 1426  BP: (!) 105/56  Pulse: 74  Resp: 16   Weight: 141 lb 3.2 oz (64 kg)   Body mass index is 22.12 kg/m. Gen: WD/WN, NAD Head: [place]/AT, No temporalis wasting.  Ear/Nose/Throat: Hearing grossly intact, nares w/o erythema or drainage Eyes: PER, EOMI, sclera nonicteric.  Neck: Supple, no large masses.   Pulmonary:  Good air movement, no audible wheezing bilaterally, no use of accessory muscles.  Cardiac: RRR, no JVD Vascular: Left upper arm AV access moderate pulsatility staccato bruit Vessel Right Left  Radial Palpable Palpable  Gastrointestinal: Non-distended. No guarding/no peritoneal signs.  Musculoskeletal: M/S 5/5 throughout.  No deformity or atrophy.  Neurologic: CN 2-12 intact. Symmetrical.  Speech is fluent. Motor exam as listed above. Psychiatric: Judgment intact, Mood & affect appropriate for pt's clinical situation.  CBC Lab Results  Component Value Date   WBC 10.6 01/12/2018   HGB 8.8 (L) 01/12/2018   HCT 25.3 (L) 01/12/2018   MCV 104.7 (H) 01/12/2018   PLT 212 01/12/2018    BMET    Component Value Date/Time   NA 141 01/12/2018 1638   NA 143 07/27/2015 1737   NA 137 05/14/2014 0741   K  3.9 01/12/2018 1638   K 3.3 (L) 05/14/2014 1500   CL 98 01/12/2018 1638   CL 98 05/14/2014 0741   CO2 34 (H) 01/12/2018 1638   CO2 35 (H) 05/14/2014 0741   GLUCOSE 142 (H) 01/12/2018 1638   GLUCOSE 96 05/14/2014 0741   BUN 18 01/12/2018 1638   BUN 25 (H) 05/14/2014 0741   CREATININE 6.77 (H) 01/12/2018 1638   CREATININE 5.83 (H) 05/14/2014 0741   CALCIUM 8.3 (L) 01/12/2018 1638   CALCIUM 8.9 05/14/2014 0741   GFRNONAA 6 (L) 01/12/2018 1638   GFRNONAA 8 (L) 05/14/2014 0741   GFRNONAA 4 (L) 02/15/2014 0837   GFRAA 7 (L) 01/12/2018 1638   GFRAA 10 (L) 05/14/2014 0741   GFRAA 5 (L) 02/15/2014 0837   CrCl cannot be calculated (Patient's most recent lab result is older than the maximum 21 days allowed.).  COAG Lab Results  Component Value Date   INR 1.00 03/11/2017   INR 1.17 05/02/2015   INR 1.0 10/10/2013     Radiology No results found.   Assessment/Plan 1. Complication of vascular access for dialysis, sequela Recommend:  The patient is doing well and currently has adequate dialysis access. The patient's dialysis center is not reporting any access issues. Flow pattern is stable when compared to the prior ultrasound.  The patient will follow-up with me in the office PRN as she now has a transplant.  I discussed with her that preservation of her access is no longer a priority and that exposure to contrast has the potential to damage the kidney.   2. ESRD on hemodialysis ([place]) No longer on dialysis  3. Essential hypertension Continue antihypertensive medications as already ordered, these medications have been reviewed and there are no changes at this time.   4. Centrilobular emphysema ([place]) Continue pulmonary medications and aerosols as already ordered, these medications have been reviewed and there are no changes at this time.    5. Diabetic polyneuropathy associated with type 2 diabetes mellitus ([place]) Continue hypoglycemic medications as already ordered, these medications have been reviewed and there are no changes at this time.  Hgb A1C to be monitored as already arranged by primary service     Hortencia Pilar, MD  09/22/2019 2:55 PM

## 2019-09-26 DIAGNOSIS — Z940 Kidney transplant status: Principal | ICD-10-CM

## 2019-09-26 DIAGNOSIS — Z79899 Other long term (current) drug therapy: Principal | ICD-10-CM

## 2019-09-27 LAB — MICROSCOPIC EXAMINATION
BACTERIA: NONE SEEN
CASTS: NONE SEEN /LPF

## 2019-09-27 LAB — URINALYSIS
BILIRUBIN UA: NEGATIVE
BLOOD UA: NEGATIVE
GLUCOSE UA: NEGATIVE
KETONES UA: NEGATIVE
LEUKOCYTE ESTERASE UA: NEGATIVE
PH UA: 8 — ABNORMAL HIGH (ref 5.0–7.5)
SPECIFIC GRAVITY UA: 1.007 (ref 1.005–1.030)
UROBILINOGEN UA: 0.2 mg/dL (ref 0.2–1.0)

## 2019-09-27 LAB — WBC URINE: Leukocytes:Naric:Pt:Urine sed:Qn:Microscopy.light.HPF: NONE SEEN

## 2019-09-27 LAB — BLOOD UA: Hemoglobin:PrThr:Pt:Urine:Ord:Test strip: NEGATIVE

## 2019-09-29 ENCOUNTER — Encounter (INDEPENDENT_AMBULATORY_CARE_PROVIDER_SITE_OTHER): Payer: Self-pay | Admitting: Vascular Surgery

## 2019-10-03 DIAGNOSIS — Z940 Kidney transplant status: Principal | ICD-10-CM

## 2019-10-03 DIAGNOSIS — Z79899 Other long term (current) drug therapy: Principal | ICD-10-CM

## 2019-10-04 DIAGNOSIS — Z940 Kidney transplant status: Principal | ICD-10-CM

## 2019-10-04 DIAGNOSIS — Z0000 Encounter for general adult medical examination without abnormal findings: Principal | ICD-10-CM

## 2019-10-04 DIAGNOSIS — R7989 Other specified abnormal findings of blood chemistry: Principal | ICD-10-CM

## 2019-10-04 DIAGNOSIS — D849 Immunodeficiency, unspecified: Principal | ICD-10-CM

## 2019-10-04 LAB — BASIC METABOLIC PANEL
BLOOD UREA NITROGEN: 14 mg/dL
CALCIUM: 10.3 mg/dL
CHLORIDE: 103 mmol/L
CO2: 18 mmol/L — ABNORMAL LOW
CREATININE: 1.14 mg/dL — ABNORMAL HIGH
EGFR CKD-EPI AA FEMALE: 60 mL/min/{1.73_m2}
EGFR CKD-EPI NON-AA FEMALE: 52 mL/min/{1.73_m2} — ABNORMAL LOW
GLUCOSE RANDOM: 137 mg/dL — ABNORMAL HIGH
POTASSIUM: 4.9 mmol/L

## 2019-10-04 LAB — URINALYSIS
BILIRUBIN UA: NEGATIVE
GLUCOSE UA: NEGATIVE
KETONES UA: NEGATIVE
LEUKOCYTE ESTERASE UA: NEGATIVE
NITRITE UA: NEGATIVE
PH UA: 6.5 (ref 5.0–7.5)
PROTEIN UA: NEGATIVE
SPECIFIC GRAVITY UA: 1.012 (ref 1.005–1.030)

## 2019-10-04 LAB — CBC W/ DIFFERENTIAL
BASOPHILS ABSOLUTE COUNT: 0 10*9/L
BASOPHILS RELATIVE PERCENT: 4 %
EOSINOPHILS ABSOLUTE COUNT: 0.1 10*9/L
EOSINOPHILS RELATIVE PERCENT: 9 %
HEMOGLOBIN: 11.1 g/dL
LYMPHOCYTES ABSOLUTE COUNT: 0.2 10*9/L — ABNORMAL LOW
LYMPHOCYTES RELATIVE PERCENT: 16 %
MEAN CORPUSCULAR HEMOGLOBIN CONC: 34 g/dL
MEAN CORPUSCULAR HEMOGLOBIN: 30.5 pg
MONOCYTES ABSOLUTE COUNT: 0.1 10*9/L
MONOCYTES RELATIVE PERCENT: 11 %
NEUTROPHILS ABSOLUTE COUNT: 0.6 10*9/L — ABNORMAL LOW
NEUTROPHILS RELATIVE PERCENT: 58 %
PLATELET COUNT: 148 10*9/L — ABNORMAL LOW
RED BLOOD CELL COUNT: 3.64 10*12/L — ABNORMAL LOW
RED CELL DISTRIBUTION WIDTH: 15.8 % — ABNORMAL HIGH

## 2019-10-04 LAB — PHOSPHORUS: Lab: 4

## 2019-10-04 LAB — LYMPHOCYTES RELATIVE PERCENT: Lab: 16

## 2019-10-04 LAB — MICROSCOPIC EXAMINATION
CASTS: NONE SEEN /LPF
RBC URINE: NONE SEEN /HPF (ref 0–2)

## 2019-10-04 LAB — MAGNESIUM: Lab: 2

## 2019-10-04 LAB — TACROLIMUS, TROUGH: Lab: 10.1

## 2019-10-04 LAB — CLARITY

## 2019-10-04 LAB — CMV DNA, QUANTITATIVE, PCR: CMV QUANT: 425 [IU]/mL

## 2019-10-04 LAB — CMV COMMENT: Lab: 0

## 2019-10-04 LAB — EPITHELIAL CELLS (NON RENAL): Epithelial cells:Naric:Pt:Urine sed:Qn:Microscopy.light.HPF: >10 — AB

## 2019-10-04 LAB — SODIUM: Lab: 135

## 2019-10-04 NOTE — Unmapped (Signed)
Several calls to [facility] about standing orders and why labs are not being drawn correctly. They state they do see his orders but cannot tell me why they are not drawing his labs correctly.    Called patient and left VM x2 to call me so she can make sure they are drawing the correct labs when she goes.    Asked her to call me back

## 2019-10-04 NOTE — Unmapped (Signed)
Talked to patient about [facility] and delay in labs. States she gave them paper order and confirms with them prior to lab draw.    Patient confirms she had labs yesterday. Asked her to get labs again tomorrow before clinic. She will    Reports she is doing well, walking more and still using her cane.      Eating well but not gaining any more weight. Encouraged eating good meals, cannot tolerate protein supplement drinks due to diarrhea (milk intolerance). Let her know that I will look into additional supplements    Denies any other needs

## 2019-10-05 ENCOUNTER — Ambulatory Visit: Admit: 2019-10-05 | Discharge: 2019-10-06 | Payer: MEDICARE | Attending: Nephrology | Primary: Nephrology

## 2019-10-05 ENCOUNTER — Encounter: Admit: 2019-10-05 | Discharge: 2019-10-06 | Payer: MEDICARE

## 2019-10-05 DIAGNOSIS — N2581 Secondary hyperparathyroidism of renal origin: Principal | ICD-10-CM

## 2019-10-05 DIAGNOSIS — D849 Immunodeficiency, unspecified: Principal | ICD-10-CM

## 2019-10-05 DIAGNOSIS — Z940 Kidney transplant status: Principal | ICD-10-CM

## 2019-10-05 DIAGNOSIS — I38 Endocarditis, valve unspecified: Principal | ICD-10-CM

## 2019-10-05 DIAGNOSIS — E559 Vitamin D deficiency, unspecified: Principal | ICD-10-CM

## 2019-10-05 DIAGNOSIS — E041 Nontoxic single thyroid nodule: Principal | ICD-10-CM

## 2019-10-05 DIAGNOSIS — R7989 Other specified abnormal findings of blood chemistry: Principal | ICD-10-CM

## 2019-10-05 DIAGNOSIS — Z0000 Encounter for general adult medical examination without abnormal findings: Principal | ICD-10-CM

## 2019-10-05 DIAGNOSIS — I739 Peripheral vascular disease, unspecified: Principal | ICD-10-CM

## 2019-10-05 DIAGNOSIS — Z955 Presence of coronary angioplasty implant and graft: Secondary | ICD-10-CM | POA: Diagnosis not present

## 2019-10-05 DIAGNOSIS — G4733 Obstructive sleep apnea (adult) (pediatric): Secondary | ICD-10-CM | POA: Diagnosis not present

## 2019-10-05 DIAGNOSIS — E1151 Type 2 diabetes mellitus with diabetic peripheral angiopathy without gangrene: Secondary | ICD-10-CM | POA: Diagnosis not present

## 2019-10-05 DIAGNOSIS — I5032 Chronic diastolic (congestive) heart failure: Secondary | ICD-10-CM | POA: Diagnosis not present

## 2019-10-05 DIAGNOSIS — Z86010 Personal history of colonic polyps: Secondary | ICD-10-CM | POA: Diagnosis not present

## 2019-10-05 DIAGNOSIS — E1142 Type 2 diabetes mellitus with diabetic polyneuropathy: Secondary | ICD-10-CM | POA: Diagnosis not present

## 2019-10-05 DIAGNOSIS — Z7984 Long term (current) use of oral hypoglycemic drugs: Secondary | ICD-10-CM | POA: Diagnosis not present

## 2019-10-05 DIAGNOSIS — Z90710 Acquired absence of both cervix and uterus: Secondary | ICD-10-CM | POA: Diagnosis not present

## 2019-10-05 DIAGNOSIS — E1121 Type 2 diabetes mellitus with diabetic nephropathy: Secondary | ICD-10-CM | POA: Diagnosis not present

## 2019-10-05 DIAGNOSIS — I252 Old myocardial infarction: Secondary | ICD-10-CM | POA: Diagnosis not present

## 2019-10-05 DIAGNOSIS — I2510 Atherosclerotic heart disease of native coronary artery without angina pectoris: Secondary | ICD-10-CM | POA: Diagnosis not present

## 2019-10-05 DIAGNOSIS — J449 Chronic obstructive pulmonary disease, unspecified: Secondary | ICD-10-CM | POA: Diagnosis not present

## 2019-10-05 DIAGNOSIS — Z7982 Long term (current) use of aspirin: Secondary | ICD-10-CM | POA: Diagnosis not present

## 2019-10-05 DIAGNOSIS — E782 Mixed hyperlipidemia: Secondary | ICD-10-CM | POA: Diagnosis not present

## 2019-10-05 DIAGNOSIS — I110 Hypertensive heart disease with heart failure: Secondary | ICD-10-CM | POA: Diagnosis not present

## 2019-10-05 DIAGNOSIS — Z6822 Body mass index (BMI) 22.0-22.9, adult: Secondary | ICD-10-CM | POA: Diagnosis not present

## 2019-10-05 LAB — HEMOGLOBIN A1C
ESTIMATED AVERAGE GLUCOSE: 114 mg/dL
Hemoglobin A1C: 5.6
Hemoglobin A1c/Hemoglobin.total:MFr:Pt:Bld:Qn:: 5.6

## 2019-10-05 LAB — BASIC METABOLIC PANEL
ANION GAP: 5 mmol/L (ref 3–11)
BLOOD UREA NITROGEN: 19 mg/dL (ref 9–23)
BUN / CREAT RATIO: 17
CALCIUM: 11.2 mg/dL — ABNORMAL HIGH (ref 8.7–10.4)
CHLORIDE: 106 mmol/L (ref 98–107)
CO2: 22.9 mmol/L (ref 20.0–31.0)
CREATININE: 1.15 mg/dL — ABNORMAL HIGH (ref 0.60–1.10)
EGFR CKD-EPI AA FEMALE: 59 mL/min/{1.73_m2}
EGFR CKD-EPI NON-AA FEMALE: 51 mL/min/{1.73_m2}
SODIUM: 134 mmol/L — ABNORMAL LOW (ref 135–145)

## 2019-10-05 LAB — MAGNESIUM: Magnesium:MCnc:Pt:Ser/Plas:Qn:: 1.8

## 2019-10-05 LAB — CBC W/ AUTO DIFF
BASOPHILS ABSOLUTE COUNT: 0 10*9/L (ref 0.0–0.1)
BASOPHILS RELATIVE PERCENT: 1.3 %
EOSINOPHILS ABSOLUTE COUNT: 0.1 10*9/L (ref 0.0–0.7)
EOSINOPHILS RELATIVE PERCENT: 2.9 %
HEMATOCRIT: 35.5 % (ref 35.0–44.0)
HEMOGLOBIN: 11.6 g/dL — ABNORMAL LOW (ref 12.0–15.5)
LYMPHOCYTES ABSOLUTE COUNT: 0.3 10*9/L — ABNORMAL LOW (ref 0.7–4.0)
LYMPHOCYTES RELATIVE PERCENT: 10.7 %
MEAN CORPUSCULAR HEMOGLOBIN CONC: 32.5 g/dL (ref 30.0–36.0)
MEAN CORPUSCULAR HEMOGLOBIN: 29.8 pg (ref 26.0–34.0)
MEAN CORPUSCULAR VOLUME: 91.6 fL (ref 82.0–98.0)
MEAN PLATELET VOLUME: 9 fL (ref 7.0–10.0)
MONOCYTES ABSOLUTE COUNT: 0.2 10*9/L (ref 0.1–1.0)
NEUTROPHILS ABSOLUTE COUNT: 2 10*9/L (ref 1.7–7.7)
NEUTROPHILS RELATIVE PERCENT: 76.5 %
RED BLOOD CELL COUNT: 3.88 10*12/L — ABNORMAL LOW (ref 3.90–5.03)
WBC ADJUSTED: 2.6 10*9/L — ABNORMAL LOW (ref 3.5–10.5)

## 2019-10-05 LAB — HEPATIC FUNCTION PANEL
ALT (SGPT): 11 U/L (ref 10–49)
AST (SGOT): 16 U/L (ref <34)
BILIRUBIN DIRECT: 0.1 mg/dL (ref 0.00–0.30)

## 2019-10-05 LAB — PHOSPHORUS: Phosphate:MCnc:Pt:Ser/Plas:Qn:: 3.9

## 2019-10-05 LAB — PARATHYROID HORMONE INTACT: Chemistry studies:Cmplx:-:^Patient:Set:: 152.8 — ABNORMAL HIGH

## 2019-10-05 LAB — PROTEIN TOTAL: Protein:MCnc:Pt:Ser/Plas:Qn:: 6.7

## 2019-10-05 LAB — BASOPHILS RELATIVE PERCENT: Basophils/100 leukocytes:NFr:Pt:Bld:Qn:Automated count: 1.3

## 2019-10-05 LAB — SODIUM: Sodium:SCnc:Pt:Ser/Plas:Qn:: 134 — ABNORMAL LOW

## 2019-10-05 LAB — SMEAR REVIEW

## 2019-10-05 NOTE — Unmapped (Signed)
Transplant Coordinator, Clinic Visit   Pt seen today by transplant nephrology for follow up, reviewed medications and symptoms. Pt doing well today, accompanied by sister.          10/05/19 1031   BP: 113/58   Pulse: 65   Temp: 35.7 ??C (96.2 ??F)   Weight: 64.7 kg (142 lb 9.6 oz)   Height: 171.5 cm (5' 7.5)   PainSc: 0-No pain       Assessment  BP: 111/54-131/76 at home  BG: 99-158  Headache: occasional 1-2 x per week, back of head, takes Tylenol PRN  Hand tremors: constant  Numbness/tingling: denies  Fevers: denies  Chills/sweats: denies  Shortness of breath: denies  Chest pain or pressure: denies  Palpitations: denies  Nausea/vomiting: occasional nausea and vomiting, last 3 weeks ago  Diarrhea/constipation: occasional diarrhea, maybe 3 times per month  UTI symptoms: denies  Swelling: denies    Good appetite; reports adequate hydration.     Any new medications? No, question about starting Sodium Bicarb  Immunosuppressant last taken: 9pm last night    Immunization status: not interested in getting COVID-19 vaccine at this time    Functional Score: 80    Normal activity with efforts; some signs or symptoms of disease.      Per Dr. Margaretmary Bayley, STOP Reglan and Bactrim, decrease Clonidine 0.1mg  daily x 2 weeks, then STOP Clonidine, pt wants to receive J&J COVID-19 vaccine after talking with Dr. Margaretmary Bayley.     I spent a total of 10 minutes with Michele Cisneros reviewing medications and symptoms.

## 2019-10-05 NOTE — Unmapped (Signed)
AOBP   Patient's blood pressure was taken on right upper arm, medium cuff.   1st reading 95/53, pulse: 66  2nd reading 120/59, pulse: 64  3rd reading 125/61, pulse: 64  Average reading 113/58, pulse: 65

## 2019-10-05 NOTE — Unmapped (Signed)
Transplant Nephrology Clinic Visit    Assessment/Plan:  Michele Cisneros is a 64 y.o. female s/p deceased donor kidney transplant on 03/30/2019 for native kidney ESRD presumed secondary to diabetic nephropathy. Active medical issues include:      S/P deceased donor kidney transplant with no history of rejection or DSAs and stable renal function.   - Serum creatinine 1.15 mg/dL (baseline range 1.6-1.0 mg/dL).     - DSA screens negative (most recently 05/20/19)   - UP/C has been normal  - BK viral load negative 07/20/19.      Immunosuppression Management   - Tacrolimus trough level 10.1 (target 6-10).    - Continue Envarsus XR 12 mg daily.   - Myfortic on hold since 09/12/19 due to leukopenia. Will resume when WBC >3.0 at starting dose of 180 mg bid.    Leukopenia, likely medication related  - Myfortic on hold and Bactrim stopped 09/12/19 with improvement in WBC from 1.0 to 2.6.       - Low level CMV viremia was noted on 4/8 and 09/19/19 and may be contributory.     Hypertension, controlled, with history of postural syncope and daytime drowsiness   - BP 110-120 systolic at home   - Continue amlodipine 10 mg daily and carvedilol 37.5 mg bid.  - Reduce clonidine to 0.1 mg daily from 0.1 mg bid for 2 weeks then stop clonidine.     Coronary artery disease, s/p RCA stent 2015, history of HFpEF secondary to diastolic dysfunction/mitral regurgitation (moderate to severe)  - no symptoms of cardiac ischemia or CHF presently   - continue atorvastatin and aspirin and carvedilol    Infectious Prophylaxis and Monitoring:   - CMV VL 425 on 09/19/19. Repeat level today is pending.  - BK VL not detected 07/20/19  - Has completed Bactrim and Valcyte prophylaxis.     Pulmonary Nodule with History of Smoking Associated COPD:   - Subcentimeter nodule seen on NM Stress 07/07/17 and CT chest 05/02/19 stable from previous studies 03/18/16 and 03/07/14  - Needs periodic low dose CT to screen for lung CA (next due November 21.     Lumbar Radiculopathy Secondary to L5-S1 disc disease and Peripheral Neuropathy secondary to DM   - Following with [facility]  - Continue Lyrica.      Thyroid Nodule:   - [place] 06/16/17 showed right upper thyroid nodule.  Annual [place] recommended and is past due.     DM, type II:  - Metformin 500mg  BID will be continued   - Hgb A1C 5.6 today.    Esophageal stenosis, history of esophageal ulcers:   - No current symptoms  - S/P dilation 07/06/19 with resolution of ulcers compared to 05/04/19 EGD    History of post-transplant UTI:   - UA without pyuria on 4/26, culture with urogenital flora.    Health Maintenance:   Mammo: 03/04/18; f/u 1 year now due  Pap Smear: s/p hysterectomy  Colonoscopy: 08/20/2017; repeat 5 years due to multiple tubular adenomas  Lung cancer screening: due for low dose CT chest.    Immunizations:   Flu: 03/18/2019  Prevnar 13: not noted in records from 2012 and after.  Will get after 1 year post transplant  Pneumovax: 03/23/18  COVID-19: Patient does not presently wish to have this vaccine     Follow-Up: Return to clinic in 3 months      Interval History:   She presents today stating she feels well. She continues to have fatigue  and daytime drowsiness. Blood pressure has been 110-120 systolic and 70s diastolic. She denies further syncopal episodes. Since her last visit she developed leukopenia (nadir WBC 1.0 on 09/19/19) necessitating discontinuation of Bactrim prior to the 6 months post-transplant date and Myfortic has been on hold since 09/12/19. CMV viral load has been mildly positive. She denies nausea, vomiting or diarrhea, fever, or chills. She denies dysphagia or odynophagia. An EGD on 07/05/19 showed benign-apperaring esophageal stenosis, dilated to 18 mm.  She denies dysuria. She has persistent but mild tenderness over the superior portion of her allograft site. She denies chest pain, edema, or shortness of breath. She has a mild cough she attributes to allergies.       She continues to have neuropathy pain and lumbar radiculopathy associated LE pain despite taking Lyrica. Blood glucoses have been normal.      Transplant History:    Native Kidney Disease: Diabetic nephropathy   Pre-transplant PRA: 70%   Organ Received: DDKT, DBD, KDPI: 70%  CMV/EBV Status: CMV D-/R+  EBV D+/R+  Resp culture: + strep pneumo  Date of Transplant: 03/30/2019  Implantation biopsy: mild ATN, otherwise normal.  Post-transplant complication - DGF  Induction: Campath with rapid steroid discontinuation  Maintenance Immunosuppression: Tacrolimus/Myfotic  Date of Ureteral Stent Removal: 06/13/2019  Current Immunosuppression: Tacrolimus/Myfortic  Results of Renal Imaging (pre and post):      Pre-Txp 07/07/17  -Kidneys are small with mildly increased echogenicity bilaterally. No solid masses or calculi in the visualized kidneys. No hydronephrosis    CT Abdomen/Pelvis w/o Contrast 03/29/2019 (pre-txp)  -1.2 cm hypodensity in the right kidney lower pole (2:48), increased in size from prior however incompletely characterized given lack of intravenous contrast.  -Uterus is surgically absent.    Post-Txp 05/08/2019 (txp kidney only)  --The renal transplant was located in the left lower quadrant. Normal size and echogenicity.  No solid masses or calculi. No perinephric collections identified. Double-J ureteral stent with proximal tip within the renal pelvis and distal tip within the bladder. Mild caliectasis. No hydronephrosis.  --Interval increase in resistive indices within the arcuate, segmental and main renal arteries measuring up to 0.98. Attention on follow-up.  --Improved perfusion in the upper pole of the transplant kidney, now with normal perfusion throughout the renal parenchyma.    Current Immunosuppression Regimen:   Envarsus 12 mg daily  Myfortic on hold since 09/12/19    Other Past Medical History  1. Diabetes mellitus, type 2, diagnosed 2012.   2. Diabetic peripheral neuropathy  3. Diabetic retinopathy, s/p laser surgery, s/p retinal detachment  4. Hypertension, diagnosed 2012.   5. Mixed hyperlipidemia.   6. Coronary artery disease, s/p NSTEMI 05/2013, PCI RCA, 20% KNm 80% PDA 1. Cardiac cath 05/02/15 LAD 20% and 30% lesions, Cx 35%, RPDA 70%, patent right coronary stent  7. HFpEF with EF 65-70% on echo 03/29/19 with  moderate to severe MR  8. COPD, emphysema secondary to smoking  9. Peripheral artery disease  10. Carotid artery stenosis 1-39% bilateral stenosis 12/2107  10. Sickle Cell Trait  11. S/P TAH/BSO 2000  12. Lumbar radiculopathy secondary to L5-S1 disc bulge   13. History of thyroid nodule  14. Colonic polyps removed 08/10/17, pathology tubular adenomas,  follow up in 3 years  15. Right breast soft tissue mass noted 07/07/17 stable in appearance compared to past imaging  16. Pulmonary nodule noted 07/07/17 similar in size to lesion as far back as 2015.   17. OSA on CPAP    Review  of Systems    Otherwise as per HPI, all other systems reviewed and are negative.    Medications  Current Outpatient Medications   Medication Sig Dispense Refill   ??? acetaminophen (TYLENOL) 500 MG tablet Take 1-2 tablets (500-1,000 mg total) by mouth every six (6) hours as needed for pain or fever (> 38C or 100.63F). 30 tablet 0   ??? albuterol (PROVENTIL HFA;VENTOLIN HFA) 90 mcg/actuation inhaler Inhale 2 puffs every six (6) hours as needed for wheezing.     ??? amLODIPine (NORVASC) 10 MG tablet Take 1 tablet (10 mg total) by mouth daily. 30 tablet 11   ??? aspirin (ECOTRIN) 81 MG tablet Take 81 mg by mouth daily.     ??? atorvastatin (LIPITOR) 40 MG tablet Take 1 tablet (40 mg total) by mouth daily. 30 tablet 11   ??? blood sugar diagnostic (CONTOUR TEST STRIPS) Strp by Other route two (2) times a day. TEST BLOOD SUGARS 2 TIMES DAILY 60 strip 5   ??? blood-glucose meter (ONETOUCH ULTRA SYSTEM KIT) kit Please dispense for patient to use at home to check blood sugars.     ??? carvediloL (COREG) 12.5 MG tablet Take 3 tablets (37.5 mg total) by mouth Two (2) times a day. 180 tablet 11   ??? cloNIDine HCL (CATAPRES) 0.1 MG tablet Take 1 tablet (0.1 mg total) by mouth Two (2) times a day. 60 tablet 11   ??? ergocalciferol (VITAMIN D2) 1,250 mcg (50,000 unit) capsule Take 1 capsule (50,000 Units total) by mouth once a week. Take for 8 weeks 4 capsule 2   ??? LANCETS,THIN MISC Using at home to test blood sugars twice daily.     ??? magnesium oxide (MAGOX) 400 mg (241.3 mg magnesium) tablet Take 1 tablet (400 mg total) by mouth Two (2) times a day. 120 tablet 11   ??? melatonin 3 mg Tab Take 2 tablets (6 mg total) by mouth nightly as needed.  0   ??? metFORMIN (GLUCOPHAGE XR) 500 MG 24 hr tablet Take 2 tablets (1,000 mg total) by mouth every morning AND 1 tablet (500 mg total) nightly. 90 tablet 11   ??? metoclopramide (REGLAN) 5 MG tablet Take 1 tablet (5 mg total) by mouth Three (3) times a day as needed (nausea). 90 tablet 1   ??? mycophenolate (MYFORTIC) 180 MG EC tablet HOLD 120 tablet 11   ??? nitroglycerin (NITROSTAT) 0.4 MG SL tablet Place 0.4 mg under the tongue daily as needed.     ??? pregabalin (LYRICA) 75 MG capsule Take 1 capsule (75 mg total) by mouth every morning AND 2 capsules (150 mg total) nightly. 90 capsule 3   ??? sodium bicarbonate 650 mg tablet Take 1 tablet (650 mg total) by mouth Two (2) times a day. 100 tablet 1   ??? tacrolimus (ENVARSUS XR) 4 mg Tb24 extended release tablet Take 3 tablets (12 mg total) by mouth every morning. 90 tablet 11     No current facility-administered medications for this visit.           Physical Exam  BP 113/58 (BP Site: R Arm, BP Position: Sitting, BP Cuff Size: Medium)  - Pulse 65  - Temp 35.7 ??C (96.2 ??F) (Temporal)  - Ht 171.5 cm (5' 7.5)  - Wt 64.7 kg (142 lb 9.6 oz)  - BMI 22.00 kg/m??   General: no acute distress  HEENT: anicteric  Neck: neck supple, no cervical lymphadenopathy appreciated  CV: grade 2/6 systolic murmur  Lungs: clear to  auscultation bilaterally  Abdomen: soft, tender with palpation over graft site   Extremities: no edema  Neurologic: uses cane for walking, bilateral LE weakness       Laboratory Data  Recent Results (from the past 170 hour(s))   Urinalysis    Collection Time: 10/03/19 11:13 AM   Result Value Ref Range    Specific Gravity, UA 1.012 1.005 - 1.030    pH, UA 6.5 5.0 - 7.5    Color, UA Yellow Yellow    Clarity, UA Cloudy (A) Clear    Leukocyte Esterase, UA Negative Negative    Protein, UA Negative Negative/Trace    Glucose, UA Negative Negative    Ketones, UA Negative Negative    Blood, UA Negative Negative    Bilirubin, UA Negative Negative    Urobilinogen, UA 0.2 0.2 - 1.0 mg/dL    Nitrite, UA Negative Negative    UA, Microscopic Comment     Microscopic Exam See below:    Culture, Urine    Collection Time: 10/03/19 11:13 AM    Specimen: Clean Catch; Urine    UR   Result Value Ref Range    Urine Culture, Comprehensive Preliminary report (A)     Result 1 Gram negative rods (A)     Result 2 Comment    Microscopic Examination    Collection Time: 10/03/19 11:13 AM   Result Value Ref Range    WBC, UA 0-5 0 - 5 /hpf    RBC, UA None seen 0 - 2 /hpf    Epithelial Cells (non renal) >10 (A) 0 - 10 /hpf    Casts None seen None seen /lpf    Mucus, UA Present Not Estab.    Bacteria, UA Moderate (A) None seen/Few   Basic Metabolic Panel    Collection Time: 10/05/19 10:20 AM   Result Value Ref Range    Sodium 134 (L) 135 - 145 mmol/L    Potassium 5.2 (H) 3.5 - 5.1 mmol/L    Chloride 106 98 - 107 mmol/L    CO2 22.9 20.0 - 31.0 mmol/L    Anion Gap 5 3 - 11 mmol/L    BUN 19 9 - 23 mg/dL    Creatinine 1.61 (H) 0.60 - 1.10 mg/dL    BUN/Creatinine Ratio 17     EGFR CKD-EPI Non-African American, Female 51 mL/min/1.45m2    EGFR CKD-EPI African American, Female 59 mL/min/1.74m2    Glucose 156 70 - 179 mg/dL    Calcium 09.6 (H) 8.7 - 10.4 mg/dL   Magnesium Level    Collection Time: 10/05/19 10:20 AM   Result Value Ref Range    Magnesium 1.8 1.6 - 2.6 mg/dL   Phosphorus Level    Collection Time: 10/05/19 10:20 AM   Result Value Ref Range    Phosphorus 3.9 2.4 - 5.1 mg/dL Hepatic Function Panel (LFTs)    Collection Time: 10/05/19 10:20 AM   Result Value Ref Range    Albumin 3.8 3.4 - 5.0 g/dL    Total Protein 6.7 5.7 - 8.2 g/dL    Total Bilirubin 0.3 0.3 - 1.2 mg/dL    Bilirubin, Direct 0.45 0.00 - 0.30 mg/dL    AST 16 <40 U/L    ALT 11 10 - 49 U/L    Alkaline Phosphatase 119 (H) 46 - 116 U/L   CBC w/ Differential    Collection Time: 10/05/19 10:20 AM   Result Value Ref Range    WBC 2.6 (L) 3.5 - 10.5 10*9/L  RBC 3.88 (L) 3.90 - 5.03 10*12/L    HGB 11.6 (L) 12.0 - 15.5 g/dL    HCT 16.1 09.6 - 04.5 %    MCV 91.6 82.0 - 98.0 fL    MCH 29.8 26.0 - 34.0 pg    MCHC 32.5 30.0 - 36.0 g/dL    RDW 40.9 (H) 81.1 - 15.0 %    MPV 9.0 7.0 - 10.0 fL    Platelet 169 150 - 450 10*9/L    Neutrophils % 76.5 %    Lymphocytes % 10.7 %    Monocytes % 8.6 %    Eosinophils % 2.9 %    Basophils % 1.3 %    Absolute Neutrophils 2.0 1.7 - 7.7 10*9/L    Absolute Lymphocytes 0.3 (L) 0.7 - 4.0 10*9/L    Absolute Monocytes 0.2 0.1 - 1.0 10*9/L    Absolute Eosinophils 0.1 0.0 - 0.7 10*9/L    Absolute Basophils 0.0 0.0 - 0.1 10*9/L    Anisocytosis Slight (A) Not Present   Morphology Review    Collection Time: 10/05/19 10:20 AM   Result Value Ref Range    Smear Review Comments See Comment (A) Undefined

## 2019-10-06 LAB — VITAMIN D, TOTAL (25OH): Lab: 19.1 — ABNORMAL LOW

## 2019-10-06 LAB — TACROLIMUS, TROUGH: Lab: 10.1

## 2019-10-06 LAB — CMV DNA, QUANTITATIVE, PCR
CMV QUANT LOG10: 1.96 {Log_IU}/mL — ABNORMAL HIGH (ref <0.00)
CMV QUANT: 91 [IU]/mL — ABNORMAL HIGH (ref <0)

## 2019-10-06 LAB — CMV VIRAL LD: Lab: 0

## 2019-10-07 LAB — BK VIRUS QUANTITATIVE PCR, BLOOD

## 2019-10-07 LAB — BK BLOOD LOG(10): Lab: 0

## 2019-10-07 NOTE — Unmapped (Signed)
Update unos form

## 2019-10-10 ENCOUNTER — Encounter: Payer: Self-pay | Admitting: Podiatry

## 2019-10-10 ENCOUNTER — Ambulatory Visit (INDEPENDENT_AMBULATORY_CARE_PROVIDER_SITE_OTHER): Payer: Medicare Other | Admitting: Podiatry

## 2019-10-10 ENCOUNTER — Other Ambulatory Visit: Payer: Self-pay

## 2019-10-10 VITALS — Temp 96.5°F

## 2019-10-10 DIAGNOSIS — Z940 Kidney transplant status: Principal | ICD-10-CM

## 2019-10-10 DIAGNOSIS — Z79899 Other long term (current) drug therapy: Principal | ICD-10-CM

## 2019-10-10 DIAGNOSIS — M79675 Pain in left toe(s): Secondary | ICD-10-CM | POA: Diagnosis not present

## 2019-10-10 DIAGNOSIS — B351 Tinea unguium: Secondary | ICD-10-CM | POA: Diagnosis not present

## 2019-10-10 DIAGNOSIS — E1142 Type 2 diabetes mellitus with diabetic polyneuropathy: Secondary | ICD-10-CM

## 2019-10-10 DIAGNOSIS — M79674 Pain in right toe(s): Secondary | ICD-10-CM

## 2019-10-10 DIAGNOSIS — L608 Other nail disorders: Secondary | ICD-10-CM

## 2019-10-10 DIAGNOSIS — I739 Peripheral vascular disease, unspecified: Secondary | ICD-10-CM

## 2019-10-10 LAB — HLA DS POST TRANSPLANT
ANTI-DONOR HLA-A #1 MFI: 35 MFI
ANTI-DONOR HLA-A #2 MFI: 32 MFI
ANTI-DONOR HLA-B #1 MFI: 40 MFI
ANTI-DONOR HLA-B #2 MFI: 136 MFI
ANTI-DONOR HLA-C #1 MFI: 0 MFI
ANTI-DONOR HLA-C #2 MFI: 0 MFI
ANTI-DONOR HLA-DQB #1 MFI: 0 MFI
ANTI-DONOR HLA-DR #1 MFI: 12 MFI
ANTI-DONOR HLA-DR #2 MFI: 0 MFI

## 2019-10-10 LAB — HLA CL2 AB RESULT: Lab: NEGATIVE

## 2019-10-10 LAB — FSAB CLASS 1 ANTIBODY SPECIFICITY: HLA CLASS 1 ANTIBODY RESULT: POSITIVE

## 2019-10-10 LAB — HLA CLASS 1 ANTIBODY

## 2019-10-10 LAB — DONOR HLA-B ANTIGEN #2

## 2019-10-10 NOTE — Progress Notes (Signed)
This patient returns to my office for at risk foot care.  This patient requires this care by a professional since this patient will be at risk due to having kidney transplant, PAD, and diabetes.  This patient is unable to cut nails herself since the patient cannot reach her nails.These nails are painful walking and wearing shoes.  This patient presents for at risk foot care today.  General Appearance  Alert, conversant and in no acute stress.  Vascular  Dorsalis pedis and posterior tibial  pulses are palpable  bilaterally.  Capillary return is within normal limits  bilaterally. Temperature is within normal limits  bilaterally.  Neurologic  Senn-Weinstein monofilament wire test within normal limits  bilaterally. Muscle power within normal limits bilaterally.  Nails Thick disfigured discolored nails with subungual debris  from hallux to fifth toes bilaterally. No evidence of bacterial infection or drainage bilaterally.  Orthopedic  No limitations of motion  feet .  No crepitus or effusions noted.  No bony pathology or digital deformities noted.  HAV  B/L.  Skin  normotropic skin with no porokeratosis noted bilaterally.  No signs of infections or ulcers noted.     Onychomycosis  Pain in right toes  Pain in left toes  Consent was obtained for treatment procedures.   Mechanical debridement of nails 1-5  bilaterally performed with a nail nipper.  Filed with dremel without incident.    Return office visit   3 months.                  Told patient to return for periodic foot care and evaluation due to potential at risk complications.   Gardiner Barefoot DPM

## 2019-10-12 LAB — VITAMIN D 1,25-DIHYDROXY: 1,25-Dihydroxyvitamin D:MCnc:Pt:Ser/Plas:Qn:: 22

## 2019-10-12 MED FILL — PREGABALIN 75 MG CAPSULE: ORAL | 30 days supply | Qty: 90 | Fill #1

## 2019-10-12 MED FILL — MAGNESIUM OXIDE 400 MG (241.3 MG MAGNESIUM) TABLET: 60 days supply | Qty: 120 | Fill #1 | Status: AC

## 2019-10-12 MED FILL — CARVEDILOL 12.5 MG TABLET: ORAL | 30 days supply | Qty: 180 | Fill #5

## 2019-10-12 MED FILL — CARVEDILOL 12.5 MG TABLET: 30 days supply | Qty: 180 | Fill #5 | Status: AC

## 2019-10-12 MED FILL — MAGNESIUM OXIDE 400 MG (241.3 MG MAGNESIUM) TABLET: ORAL | 60 days supply | Qty: 120 | Fill #1

## 2019-10-12 MED FILL — PREGABALIN 75 MG CAPSULE: 30 days supply | Qty: 90 | Fill #1 | Status: AC

## 2019-10-13 MED ORDER — SODIUM BICARBONATE 650 MG TABLET
ORAL_TABLET | Freq: Two times a day (BID) | ORAL | 1 refills | 50 days | Status: CP
Start: 2019-10-13 — End: 2020-10-12
  Filled 2019-10-19: qty 100, 50d supply, fill #0

## 2019-10-13 NOTE — Unmapped (Signed)
[facility] Refill Coordination Note    Specialty Medication(s) to be Shipped:   Transplant: Envarsus 4mg     Other medication(s) to be shipped:   Clonidine  Amlodipine  Atorvastatin  Metformin  Sodium Bicarbonate (sent refill request)     Michele Cisneros, DOB: 07-27-1955  Phone: [phone] (home)       All above HIPAA information was verified with patient.     Was a Nurse, learning disability used for this call? No    Completed refill call assessment today to schedule patient's medication shipment from the [facility] [phone]).       Specialty medication(s) and dose(s) confirmed: Regimen is correct and unchanged.   Changes to medications: Lashelle reports no changes at this time.  Changes to insurance: No  Questions for the pharmacist: No    Confirmed patient received Welcome Packet with first shipment. The patient will receive a drug information handout for each medication shipped and additional FDA Medication Guides as required.       DISEASE/MEDICATION-SPECIFIC INFORMATION        N/A    SPECIALTY MEDICATION ADHERENCE     Medication Adherence    Patient reported X missed doses in the last month: 0  Specialty Medication: Envarsus XR 4 mg  Patient is on additional specialty medications: No  Any gaps in refill history greater than 2 weeks in the last 3 months: no  Demonstrates understanding of importance of adherence: yes  Informant: patient  Reliability of informant: reliable  Confirmed plan for next specialty medication refill: delivery by pharmacy  Refills needed for supportive medications: yes, ordered or provider notified                Envarsus XR 4mg . 14 days on hand      SHIPPING     Shipping address confirmed in Epic.     Delivery Scheduled: Yes, Expected medication delivery date: 10/20/2019.     Medication will be delivered via Next Day Courier to the prescription address in Epic WAM.    Michele Cisneros   [facility] Specialty Technician

## 2019-10-17 ENCOUNTER — Telehealth: Payer: Self-pay | Admitting: Internal Medicine

## 2019-10-17 DIAGNOSIS — Z79899 Other long term (current) drug therapy: Principal | ICD-10-CM

## 2019-10-17 DIAGNOSIS — Z940 Kidney transplant status: Principal | ICD-10-CM

## 2019-10-17 DIAGNOSIS — E559 Vitamin D deficiency, unspecified: Secondary | ICD-10-CM | POA: Diagnosis not present

## 2019-10-17 DIAGNOSIS — T8610 Unspecified complication of kidney transplant: Secondary | ICD-10-CM | POA: Diagnosis not present

## 2019-10-17 DIAGNOSIS — B259 Cytomegaloviral disease, unspecified: Secondary | ICD-10-CM | POA: Diagnosis not present

## 2019-10-17 DIAGNOSIS — D631 Anemia in chronic kidney disease: Secondary | ICD-10-CM | POA: Diagnosis not present

## 2019-10-17 DIAGNOSIS — E1129 Type 2 diabetes mellitus with other diabetic kidney complication: Secondary | ICD-10-CM | POA: Diagnosis not present

## 2019-10-17 DIAGNOSIS — Z09 Encounter for follow-up examination after completed treatment for conditions other than malignant neoplasm: Secondary | ICD-10-CM | POA: Diagnosis not present

## 2019-10-17 DIAGNOSIS — D899 Disorder involving the immune mechanism, unspecified: Secondary | ICD-10-CM | POA: Diagnosis not present

## 2019-10-17 DIAGNOSIS — N390 Urinary tract infection, site not specified: Secondary | ICD-10-CM | POA: Diagnosis not present

## 2019-10-17 DIAGNOSIS — Z114 Encounter for screening for human immunodeficiency virus [HIV]: Secondary | ICD-10-CM | POA: Diagnosis not present

## 2019-10-17 NOTE — Telephone Encounter (Signed)
Plan of care faxed to [facility]  Order date 61/4/83 Cert date 12/09/52- 3/0/14

## 2019-10-18 LAB — URINALYSIS
BLOOD UA: NEGATIVE
GLUCOSE UA: NEGATIVE
KETONES UA: NEGATIVE
LEUKOCYTE ESTERASE UA: NEGATIVE
NITRITE UA: NEGATIVE
PH UA: 6.5 (ref 5.0–7.5)
SPECIFIC GRAVITY UA: 1.008 (ref 1.005–1.030)
UROBILINOGEN UA: 0.2 mg/dL (ref 0.2–1.0)

## 2019-10-18 LAB — MICROSCOPIC EXAMINATION
BACTERIA: NONE SEEN
RBC URINE: NONE SEEN /HPF (ref 0–2)
WBC URINE: NONE SEEN /HPF (ref 0–5)

## 2019-10-18 LAB — BACTERIA: Bacteria:Naric:Pt:Urine sed:Qn:Microscopy.light.HPF: NONE SEEN

## 2019-10-18 LAB — UA, MICROSCOPIC

## 2019-10-19 LAB — CBC W/ DIFFERENTIAL
BASOPHILS ABSOLUTE COUNT: 0 10*9/L
BASOPHILS RELATIVE PERCENT: 1 %
EOSINOPHILS ABSOLUTE COUNT: 0.1 10*9/L
EOSINOPHILS RELATIVE PERCENT: 3 %
HEMATOCRIT: 4.6 %
HEMOGLOBIN: 11.3 g/dL
LYMPHOCYTES ABSOLUTE COUNT: 0.4 10*9/L — ABNORMAL LOW
MEAN CORPUSCULAR HEMOGLOBIN CONC: 32.7 g/dL
MEAN CORPUSCULAR HEMOGLOBIN: 2.8 pg
MEAN CORPUSCULAR VOLUME: 91 fL
MONOCYTES ABSOLUTE COUNT: 0.3 10*9/L
MONOCYTES RELATIVE PERCENT: 11 %
NEUTROPHILS ABSOLUTE COUNT: 2.1 10*9/L
NEUTROPHILS RELATIVE PERCENT: 70 %
PLATELET COUNT: 155 10*9/L
WBC ADJUSTED: 3 10*9/L — ABNORMAL LOW

## 2019-10-19 LAB — BASIC METABOLIC PANEL
BLOOD UREA NITROGEN: 16 mg/dL
CALCIUM: 10 mg/dL
CO2: 16 mmol/L — ABNORMAL LOW
CREATININE: 1.21 mg/dL — ABNORMAL HIGH
GLUCOSE RANDOM: 93 mg/dL
POTASSIUM: 5.3 mmol/L — ABNORMAL HIGH

## 2019-10-19 LAB — PHOSPHORUS: Lab: 3.8

## 2019-10-19 LAB — TACROLIMUS, TROUGH: Lab: 16.3

## 2019-10-19 LAB — CALCIUM: Lab: 10

## 2019-10-19 LAB — MEAN PLATELET VOLUME: Lab: 0

## 2019-10-19 LAB — MAGNESIUM: Lab: 1.9

## 2019-10-19 MED FILL — METFORMIN ER 500 MG TABLET,EXTENDED RELEASE 24 HR: ORAL | 30 days supply | Qty: 90 | Fill #3

## 2019-10-19 MED FILL — ATORVASTATIN 40 MG TABLET: 30 days supply | Qty: 30 | Fill #3 | Status: AC

## 2019-10-19 MED FILL — ENVARSUS XR 4 MG TABLET,EXTENDED RELEASE: ORAL | 30 days supply | Qty: 90 | Fill #2

## 2019-10-19 MED FILL — AMLODIPINE 10 MG TABLET: 30 days supply | Qty: 30 | Fill #5 | Status: AC

## 2019-10-19 MED FILL — ENVARSUS XR 4 MG TABLET,EXTENDED RELEASE: 30 days supply | Qty: 90 | Fill #2 | Status: AC

## 2019-10-19 MED FILL — METFORMIN ER 500 MG TABLET,EXTENDED RELEASE 24 HR: 30 days supply | Qty: 90 | Fill #3 | Status: AC

## 2019-10-19 MED FILL — SODIUM BICARBONATE 650 MG TABLET: 50 days supply | Qty: 100 | Fill #0 | Status: AC

## 2019-10-19 MED FILL — AMLODIPINE 10 MG TABLET: ORAL | 30 days supply | Qty: 30 | Fill #5

## 2019-10-19 MED FILL — CLONIDINE HCL 0.1 MG TABLET: 30 days supply | Qty: 60 | Fill #0 | Status: AC

## 2019-10-20 MED ORDER — ATORVASTATIN 40 MG TABLET
ORAL_TABLET | Freq: Every day | ORAL | 11 refills | 90.00000 days | Status: CP
Start: 2019-10-20 — End: 2020-10-19
  Filled 2019-10-19: qty 30, 30d supply, fill #3
  Filled 2019-11-17: qty 90, 90d supply, fill #0

## 2019-10-24 DIAGNOSIS — Z79899 Other long term (current) drug therapy: Principal | ICD-10-CM

## 2019-10-24 DIAGNOSIS — Z940 Kidney transplant status: Principal | ICD-10-CM

## 2019-10-24 DIAGNOSIS — H43313 Vitreous membranes and strands, bilateral: Secondary | ICD-10-CM | POA: Diagnosis not present

## 2019-10-24 DIAGNOSIS — E11319 Type 2 diabetes mellitus with unspecified diabetic retinopathy without macular edema: Secondary | ICD-10-CM | POA: Diagnosis not present

## 2019-10-24 LAB — HM DIABETES EYE EXAM

## 2019-10-25 ENCOUNTER — Encounter: Payer: Self-pay | Admitting: Internal Medicine

## 2019-10-27 MED FILL — MAGNESIUM OXIDE 400 MG (241.3 MG MAGNESIUM) TABLET: 60 days supply | Qty: 120 | Fill #2 | Status: AC

## 2019-10-27 MED FILL — MAGNESIUM OXIDE 400 MG (241.3 MG MAGNESIUM) TABLET: ORAL | 60 days supply | Qty: 120 | Fill #2

## 2019-10-28 DIAGNOSIS — M4726 Other spondylosis with radiculopathy, lumbar region: Secondary | ICD-10-CM | POA: Diagnosis not present

## 2019-10-30 NOTE — Progress Notes (Signed)
Cardiology Office Note    Date:  11/02/2019   ID:  Theresa [name], DOB July 22, 1955, MRN [id number]  PCP:  McLean-Scocuzza, Nino Glow, MD  Cardiologist:  Ida Rogue, MD  Electrophysiologist:  None   Chief Complaint: Follow-up  History of Present Illness:   Theresa [name] is a 64 y.o. female with history of CAD status post RCA stenting, moderate mitral regurgitation, HFpEF, ESRD presumed secondary to diabetic nephropathy previously on HD status post deceased donor renal transplant on 03/30/2019, COPD, PVD, carotid artery disease, DM2 with diabetic neuropathy and retinopathy, sickle cell trait, HTN, HLD, lumbar radiculopathy, thyroid nodule, esophageal stenosis with history of esophageal ulcers status post dilatation on 07/06/2019 with noted resolution of ulcers when compared to prior study on 05/04/2019, and remote tobacco use who presents for follow-up of her CAD, mild regurgitation, and HFpEF.  Her cardiac history dates back to 05/2013 at which time she was admitted for an NSTEMI with LHC at that time showing distal left main 20% stenosis, LAD with minimal irregularities, small LCx which was normal, OM1 normal, dominant RCA with 95% mid stenosis status post PCI/DES, proximal PDA 80% stenosis.  Repeat cath in 04/2015 showed left main normal, mid LAD 30% stenosis, D1/2 minimal irregularities, proximal to mid LCx 35% stenosis, OM 2 and 3 minimal irregularities, patent mid RCA stent, 70% ostial RPDA stenosis, 30% RPL B1 stenosis, R PLB 2/3 minimal irregularities, EF 55 to 65%.  Medical management recommended.  Most recent ischemic evaluation via Myoview in 06/2017 at [facility] showed no ischemia or infarct with an EF of 64%.  With regards to her mitral regurgitation, echo from 07/2017 at [facility] showed stable moderate mitral regurgitation and aortic valve sclerosis with normal LVSF.  She was last seen in the office in 11/2018 and was doing well from a cardiac perspective with stable weights running between 166 and 168  pounds.  At that time she was tolerating hemodialysis on Monday, Wednesday, Friday.  Most recent echo from 12/2018 showed an EF of 55 to 60%, mildly increased LV wall thickness, diastolic dysfunction, elevated mean left atrial pressure with no evidence of RWMA, normal RV systolic function and ventricular cavity size with normal RV wall thickness, mildly dilated left atrium, mild to moderate mitral regurgitation.  She has subsequently undergone deceased donor renal transplant through [facility] on 03/30/2019 and is followed by [facility].  She comes in doing very well from a cardiac perspective.  She denies any chest pain, dyspnea, palpitations, dizziness, presyncope, syncope.  Since undergoing her renal transplant she has followed up with her [facility] nephrologist regularly.  Her medication list has changed considerably.  She has been tapered off clonidine and no longer takes furosemide, Imdur, or losartan.  With her new dry weight is approximately 140 to 145 pounds.  Her main complaint is some lower extremity weakness which has been attributed to her prolonged hospitalizations.  No falls.  She does not have any issues or concerns at this time.   Labs independently reviewed: 10/2019 - Hgb 13.3, PLT 155, BUN 16, serum creatinine 1.21, magnesium 1.9, potassium 5.3 09/2019 - A1c 5.6, AST/ALT normal, albumin 3.8 05/2019 - TC 159, TG 181, HDL 53, LDL 70 04/2016 - TSH normal  Past Medical History:  Diagnosis Date  . Anemia of chronic disease   . Anginal pain ([place])   . Carotid arterial disease ([place])    a. 02/2013 U/S: 40-59% bilat ICA stenosis.  . Chronic constipation   . Chronic diastolic CHF (congestive heart failure) (  [place])    a. 10/2013 Echo [facility]): EF 55-60%, mod conc LVH, mod MR, mildly dil LA, mild Ao sclerosis w/o stenosis; b. 02/2016 Echo: EF 55-60%, no rwma, Gr DD, mild AS, mod to sev MR, mildly dil LA, PASP 70mmHg; c. 07/2017 Echo [facility]): EF >55%, mild to mod LVH, Gr2 DD, Ao scl, Mod MR, mod dil LA, nl RV fxn.  .  Colon polyps   . COPD (chronic obstructive pulmonary disease) ([place])   . Coronary artery disease    a. 05/2013 NSTEMI/PCI: LM 20d, LAD min irregs, LCX small, nl, OM1 nl, RCA dom 76m (2.5x16 Promus DES), PDA1 80p; b. 04/2015 Cath: LM nl, LAD 28m, D1/2 min irregs, LCX 35p/m, OM2/3 min irregs, RCA patent mid stent, RPDA 70ost, RPLB1 30, RPLB2/3 min irregs, EF 55-65%--> Med Rx; c. 06/2017 [facility]): No ischemia/infarct, EF 64%.  . Diabetes mellitus   . Diabetic neuropathy ([place])   . Diabetic retinopathy ([place]) 05/28/2013   Hx bilat retinal detachment, proliferative diab retinopathy and bilat vitreous hemorrhage   . Emphysema   . ESRD on hemodialysis ([place])    a. [facility] in [place], [place] [phone]/Dr. [name], on a MWF schedule.  She started dialysis in Feb 2014.  Etiology of renal failure not known, likely diabetes.  Has a left upper arm AV graft.  . History of bronchitis    Mar 2012  . History of pneumonia    June 2012  . History of tobacco abuse    a. Quit 2012.  Marland Kitchen Hyperlipidemia   . Hypertension   . Moderate to severe mitral insufficiency    a. 10/2013 Echo: EF 55-60%, mod MR; b. 02/2016 Echo: EF 55-60%, mod to sev MR directed posteriorly--felt to be dynamic -worse with volume overload; c. 07/2017 Echo [facility]): EF >55%. Mod MR.  . Myocardial infarct ([place]) 05/2013  . Peripheral vascular disease ([place])   . Sickle cell trait ([place])   . Thyroid nodule    [place] 06/2017 due to f/u US in 1 year     Past Surgical History:  Procedure Laterality Date  . A/V FISTULAGRAM Left 11/10/2017   Procedure: A/V FISTULAGRAM;  Surgeon: Katha Cabal, MD;  Location: [place] CV LAB;  Service: Cardiovascular;  Laterality: Left;  . A/V SHUNTOGRAM Left 02/08/2019   Procedure: A/V SHUNTOGRAM;  Surgeon: Katha Cabal, MD;  Location: [place] [facility];  Service: Cardiovascular;  Laterality: Left;  . ABDOMINAL HYSTERECTOMY     2000  . CARDIAC CATHETERIZATION    . CARDIAC CATHETERIZATION N/A 05/02/2015    Procedure: Left Heart Cath and Coronary Angiography;  Surgeon: [name], MD;  Location: [place] CV LAB;  Service: Cardiovascular;  Laterality: N/A;  . COLONOSCOPY WITH PROPOFOL N/A 07/08/2016   Procedure: COLONOSCOPY WITH PROPOFOL;  Surgeon: [name], MD;  Location: [facility] ENDOSCOPY;  Service: Endoscopy;  Laterality: N/A;  . COLONOSCOPY WITH PROPOFOL N/A 08/20/2017   Procedure: COLONOSCOPY WITH PROPOFOL;  Surgeon: [name], MD;  Location: [facility] ENDOSCOPY;  Service: Gastroenterology;  Laterality: N/A;  . colonscopy    . CORONARY ANGIOPLASTY  05/28/2014   stent placement to the mid RCA  . [facility] with stent placement 11 or 05/2019 and stent removal 06/13/19   . DILATION AND CURETTAGE OF UTERUS     several in the early 80's  . ESOPHAGOGASTRODUODENOSCOPY     2012  . ESOPHAGOGASTRODUODENOSCOPY (EGD) WITH PROPOFOL N/A 03/11/2018   Procedure: ESOPHAGOGASTRODUODENOSCOPY (EGD) WITH PROPOFOL;  Surgeon:  [name], MD;  Location: [facility] ENDOSCOPY;  Service: Gastroenterology;  Laterality: N/A;  . EYE SURGERY     bilateral laser 2012  . EYE SURGERY     right  . EYE SURGERY     x4 both eyes  . GAS INSERTION  09/30/2011   Procedure: INSERTION OF GAS;  Surgeon: [name], MD;  Location: [place];  Service: Ophthalmology;  Laterality: Right;  [id number]  . GAS/FLUID EXCHANGE  09/30/2011   Procedure: GAS/FLUID EXCHANGE;  Surgeon: [name], MD;  Location: [place];  Service: Ophthalmology;  Laterality: Right;  . KIDNEY TRANSPLANT     03/30/19 Dr. Mickel Baas Thomas/Dr. Cristie Hem Zendel/Dr. Gilford Raid; [facility] transplant Dr. Horald Chestnut will be primary [phone] coordinator Sarah Wynkoop [phone]  . KIDNEY TRANSPLANT     03/30/19 [facility]  . LEFT HEART CATHETERIZATION WITH CORONARY ANGIOGRAM N/A 05/28/2013   Procedure: LEFT HEART CATHETERIZATION WITH CORONARY ANGIOGRAM;  Surgeon: [name], MD;  Location: [facility] CATH LAB;  Service: Cardiovascular;  Laterality: N/A;  . PARS  PLANA VITRECTOMY  04/22/2011   Procedure: PARS PLANA VITRECTOMY WITH 25 GAUGE;  Surgeon: [name], MD;  Location: [place];  Service: Ophthalmology;  Laterality: Left;  membrane peel, endolaser, gas fluid exchange, silicone oil, repair of complex traction retinal detachment  . PARS PLANA VITRECTOMY  09/30/2011   Procedure: PARS PLANA VITRECTOMY WITH 25 GAUGE;  Surgeon: [name], MD;  Location: [place];  Service: Ophthalmology;  Laterality: Right;  Endolaser; Repair of Complex Traction Retinal Detachment  . PARS PLANA VITRECTOMY  02/24/2012   Procedure: PARS PLANA VITRECTOMY WITH 25 GAUGE;  Surgeon: [name], MD;  Location: [place];  Service: Ophthalmology;  Laterality: Left;  . PTCA    . SILICON OIL REMOVAL  9/98/3382   Procedure: SILICON OIL REMOVAL;  Surgeon: [name], MD;  Location: [place];  Service: Ophthalmology;  Laterality: Left;  . THROMBECTOMY / ARTERIOVENOUS GRAFT REVISION    . TUBAL LIGATION     1979  . UPPER EXTREMITY ANGIOGRAPHY Left 02/08/2019   Procedure: UPPER EXTREMITY ANGIOGRAPHY;  Surgeon: Katha Cabal, MD;  Location: [place] CV LAB;  Service: Cardiovascular;  Laterality: Left;    Current Medications: Current Meds  Medication Sig  . albuterol (PROVENTIL) (2.5 MG/3ML) 0.083% nebulizer solution Take 3 mLs (2.5 mg total) by nebulization every 6 (six) hours as needed for wheezing or shortness of breath.  Marland Kitchen albuterol (VENTOLIN HFA) 108 (90 Base) MCG/ACT inhaler Inhale 1-2 puffs into the lungs every 6 (six) hours as needed for wheezing or shortness of breath.  Marland Kitchen amLODipine (NORVASC) 10 MG tablet Take 10 mg by mouth daily.   Marland Kitchen aspirin EC 81 MG tablet Take 1 tablet (81 mg total) by mouth at bedtime.  Marland Kitchen atorvastatin (LIPITOR) 40 MG tablet Take 40 mg by mouth daily at 6 PM.  . carvedilol (COREG) 25 MG tablet Take 1.5 tablets (37.5 mg total) by mouth 2 (two) times daily with a meal.  . cetirizine (ZYRTEC) 5 MG tablet Take 1 tablet (5 mg total) by mouth  daily as needed for allergies. (Patient taking differently: Take 5 mg by mouth daily. )  . lidocaine (XYLOCAINE) 2 % solution   . magnesium oxide (MAG-OX) 400 MG tablet Take by mouth.  . melatonin 3 MG TABS tablet Take by mouth.  . metFORMIN (GLUCOPHAGE) 500 MG tablet Take by mouth 2 (two) times daily with a meal.  . metoCLOPramide (REGLAN)  5 MG tablet Take by mouth.  . mycophenolate (MYFORTIC) 360 MG TBEC EC tablet Take 360 mg by mouth 2 (two) times daily.  . nitroGLYCERIN (NITROSTAT) 0.4 MG SL tablet Place 1 tablet (0.4 mg total) under the tongue every 5 (five) minutes as needed for chest pain.  . pregabalin (LYRICA) 75 MG capsule Take 1 capsule (75 mg total) by mouth 2 (two) times daily.  . Tacrolimus ER (ENVARSUS XR) 1 MG TB24 Take 2 tablets by mouth every morning.  . Tacrolimus ER 4 MG TB24 Take 2 tablets by mouth every morning. Total dose 14 mg daily in am  . UNABLE TO FIND TEST BLOOD SUGARS 2 TIMES DAILY  . valGANciclovir (VALCYTE) 450 MG tablet Take 450 mg by mouth daily.  . [DISCONTINUED] AURYXIA 1 GM 210 MG(Fe) tablet Take 210-630 mg by mouth 3 (three) times daily with meals. Take 630 mg with each meal and 210 mg with each snack  . [DISCONTINUED] CHELATED MAGNESIUM PO Take 133 mg by mouth 2 (two) times daily.  . [DISCONTINUED] Cholecalciferol 1.25 MG (50000 UT) TABS Take by mouth once a week. X 8 weeks  . [DISCONTINUED] ciprofloxacin (CIPRO) 500 MG tablet Take 500 mg by mouth 2 (two) times daily.  . [DISCONTINUED] cloNIDine (CATAPRES) 0.2 MG tablet Take 1 tablet (0.2 mg total) by mouth 3 (three) times daily.  . [DISCONTINUED] donepezil (ARICEPT) 5 MG tablet Take 5 mg by mouth daily.  . [DISCONTINUED] furosemide (LASIX) 80 MG tablet Take 1 tablet (80 mg total) by mouth See admin instructions. Take 80 mg by mouth once daily on Sun, Tue, Thur, and Sat (non dialysis days)  . [DISCONTINUED] isosorbide mononitrate (IMDUR) 60 MG 24 hr tablet Take 1 tablet (60 mg total) by mouth daily.  .  [DISCONTINUED] losartan (COZAAR) 50 MG tablet Take 50 mg by mouth daily.   . [DISCONTINUED] megestrol (MEGACE) 20 MG tablet Take 10 mg by mouth daily. Dr. Inocente Salles  . [DISCONTINUED] mirtazapine (REMERON) 15 MG tablet Take 15 mg by mouth at bedtime.  . [DISCONTINUED] multivitamin (RENA-VIT) TABS tablet Take 1 tablet by mouth daily.  . [DISCONTINUED] nitrofurantoin, macrocrystal-monohydrate, (MACROBID) 100 MG capsule Take 100 mg by mouth every 12 (twelve) hours.  . [DISCONTINUED] pantoprazole (PROTONIX) 40 MG tablet Take 40 mg by mouth 2 (two) times daily.  . [DISCONTINUED] senna (SENOKOT) 8.6 MG TABS tablet Take 1 tablet by mouth at bedtime as needed for mild constipation.  . [DISCONTINUED] sucralfate (CARAFATE) 1 GM/10ML suspension Take 1 g by mouth 4 (four) times daily -  with meals and at bedtime. 10 ml Q6 hrs  . [DISCONTINUED] sulfamethoxazole-trimethoprim (BACTRIM) 400-80 MG tablet Take 1 tablet by mouth every Monday, Wednesday, and Friday. Stop date 09/2019    Allergies:   Hydrocodone, Metformin, and Lisinopril   Social History   Socioeconomic History  . Marital status: Single    Spouse name: Not on file  . Number of children: 2  . Years of education: Not on file  . Highest education level: Not on file  Occupational History  . Not on file  Tobacco Use  . Smoking status: Former Smoker    Packs/day: 1.00    Years: 25.00    Pack years: 25.00    Quit date: 06/09/2010    Years since quitting: 9.4  . Smokeless tobacco: Never Used  Substance and Sexual Activity  . Alcohol use: No  . Drug use: No  . Sexual activity: Never  Other Topics Concern  . Not on file  Social History Narrative   On disability.    Lives with son Brenton Grills   2 children (has son and daughter)      Son drives her since her vision has decreased   Social Determinants of Radio broadcast assistant Strain:   . Difficulty of Paying Living Expenses:   Food Insecurity:   . Worried About Charity fundraiser in  the Last Year:   . Arboriculturist in the Last Year:   Transportation Needs:   . Film/video editor (Medical):   Marland Kitchen Lack of Transportation (Non-Medical):   Physical Activity:   . Days of Exercise per Week:   . Minutes of Exercise per Session:   Stress:   . Feeling of Stress :   Social Connections:   . Frequency of Communication with Friends and Family:   . Frequency of Social Gatherings with Friends and Family:   . Attends Religious Services:   . Active Member of Clubs or Organizations:   . Attends Archivist Meetings:   Marland Kitchen Marital Status:      Family History:  The patient's family history includes Breast cancer in her sister; Colon cancer in her father and sister; Diabetes in her brother, brother, and daughter; Heart attack in her brother and mother; Heart disease in her mother; Renal Disease in her daughter; Stroke in her brother and mother. There is no history of Anesthesia problems, Hypotension, Malignant hyperthermia, or Pseudochol deficiency.  ROS:   Review of Systems  Constitutional: Positive for malaise/fatigue. Negative for chills, diaphoresis, fever and weight loss.  HENT: Negative for congestion.   Eyes: Negative for discharge and redness.  Respiratory: Negative for cough, hemoptysis, sputum production, shortness of breath and wheezing.   Cardiovascular: Negative for chest pain, palpitations, orthopnea, claudication, leg swelling and PND.  Gastrointestinal: Negative for abdominal pain, blood in stool, heartburn, melena, nausea and vomiting.  Genitourinary: Negative for hematuria.  Musculoskeletal: Negative for falls and myalgias.       Lower extremity weakness  Skin: Negative for rash.  Neurological: Negative for dizziness, tingling, tremors, sensory change, speech change, focal weakness, loss of consciousness and weakness.  Endo/Heme/Allergies: Does not bruise/bleed easily.  Psychiatric/Behavioral: Negative for substance abuse. The patient is not  nervous/anxious.   All other systems reviewed and are negative.    EKGs/Labs/Other Studies Reviewed:    Studies reviewed were summarized above. The additional studies were reviewed today: As above.   EKG:  EKG is ordered today.  The EKG ordered today demonstrates NSR, 69 bpm, inferior Q waves, nonspecific ST-T changes in lateral leads which are new when compared to prior tracing  Recent Labs: No results found for requested labs within last 8760 hours.  Recent Lipid Panel    Component Value Date/Time   CHOL 161 05/22/2016 0147   TRIG 65 05/22/2016 0147   HDL 59 05/22/2016 0147   CHOLHDL 2.7 05/22/2016 0147   VLDL 13 05/22/2016 0147   LDLCALC 89 05/22/2016 0147   LDLDIRECT 100.6 11/12/2012 0906    PHYSICAL EXAM:    VS:  BP (!) 110/46 (BP Location: Left Arm, Patient Position: Sitting, Cuff Size: Normal)   Pulse 69   Ht 5' 7.5" (1.715 m)   Wt 144 lb (65.3 kg)   BMI 22.22 kg/m   BMI: Body mass index is 22.22 kg/m.  Physical Exam  Constitutional: She is oriented to person, place, and time. She appears well-developed and well-nourished.  HENT:  Head: Normocephalic and atraumatic.  Eyes:  Right eye exhibits no discharge. Left eye exhibits no discharge.  Neck: No JVD present.  Cardiovascular: Normal rate, regular rhythm, S1 normal and S2 normal. Exam reveals no distant heart sounds, no friction rub, no midsystolic click and no opening snap.  Murmur heard. High-pitched blowing holosystolic murmur is present with a grade of 2/6 at the apex. Pulses:      Dorsalis pedis pulses are 2+ on the right side and 2+ on the left side.       Posterior tibial pulses are 2+ on the right side and 2+ on the left side.  Pulmonary/Chest: Effort normal and breath sounds normal. No respiratory distress. She has no decreased breath sounds. She has no wheezes. She has no rales. She exhibits no tenderness.  Abdominal: Soft. She exhibits no distension. There is no abdominal tenderness.  Musculoskeletal:         General: No edema.     Cervical back: Normal range of motion.  Neurological: She is alert and oriented to person, place, and time.  Skin: Skin is warm and dry. No cyanosis. Nails show no clubbing.  Psychiatric: She has a normal mood and affect. Her speech is normal and behavior is normal. Judgment and thought content normal.    Wt Readings from Last 3 Encounters:  11/02/19 144 lb (65.3 kg)  09/22/19 141 lb 3.2 oz (64 kg)  06/16/19 151 lb 9.6 oz (68.8 kg)     ASSESSMENT & PLAN:   1. CAD involving the native coronary arteries without angina: She is doing well without any symptoms concerning for angina.  She does have new lateral T wave inversion on EKG today that was completely asymptomatic.  Recent nuclear stress test at [facility] in 2019 was nonischemic.  Update echo as outlined below.  If there are any concerning changes on this study consider repeat noninvasive ischemic evaluation.  Would attempt to avoid contrast related studies given her underlying kidney disease status post transplant.  Continue secondary prevention with aspirin, amlodipine, atorvastatin, and carvedilol.  No longer on nitrate therapy secondary to relative hypotension without any worsening in anginal symptoms.  2. Moderate mitral regurgitation: Asymptomatic with most recent echo demonstrating mild to moderate regurgitation.  Follow-up echo next month.  3. HFpEF: She is euvolemic and well compensated.  No longer requiring hemodialysis in the setting of renal transplant.  No longer needing Lasix.  CHF education.  Optimal blood pressure and heart rate control recommended.  4. ESRD s/p renal transplant: Followed by [facility] transplant service and nephrology.  5. HTN: Blood pressure well controlled today.  Continue current medications as outlined above.  6. HLD: LDL of 70 from 05/2019.  Remains on atorvastatin.  7. PVD: She does note lower extremity weakness though no symptoms of claudication.  Recommend she follow-up with  vascular surgery as directed.  Disposition: F/u with Dr. Rockey Situ or an APP in 6 months, sooner if needed.   Medication Adjustments/Labs and Tests Ordered: Current medicines are reviewed at length with the patient today.  Concerns regarding medicines are outlined above. Medication changes, Labs and Tests ordered today are summarized above and listed in the Patient Instructions accessible in Encounters.   Signed, Christell Faith, PA-C 11/02/2019 5:25 PM     [address] [place], [place] [phone]

## 2019-10-31 ENCOUNTER — Other Ambulatory Visit: Payer: Self-pay | Admitting: Nurse Practitioner

## 2019-10-31 DIAGNOSIS — Z79899 Other long term (current) drug therapy: Principal | ICD-10-CM

## 2019-10-31 DIAGNOSIS — Z940 Kidney transplant status: Principal | ICD-10-CM

## 2019-10-31 DIAGNOSIS — G8929 Other chronic pain: Secondary | ICD-10-CM

## 2019-10-31 DIAGNOSIS — M5416 Radiculopathy, lumbar region: Secondary | ICD-10-CM

## 2019-11-02 ENCOUNTER — Other Ambulatory Visit: Payer: Self-pay

## 2019-11-02 ENCOUNTER — Encounter: Payer: Self-pay | Admitting: Physician Assistant

## 2019-11-02 ENCOUNTER — Ambulatory Visit (INDEPENDENT_AMBULATORY_CARE_PROVIDER_SITE_OTHER): Payer: Medicare Other | Admitting: Physician Assistant

## 2019-11-02 VITALS — BP 110/46 | HR 69 | Ht 67.5 in | Wt 144.0 lb

## 2019-11-02 DIAGNOSIS — I2510 Atherosclerotic heart disease of native coronary artery without angina pectoris: Secondary | ICD-10-CM

## 2019-11-02 DIAGNOSIS — I5032 Chronic diastolic (congestive) heart failure: Secondary | ICD-10-CM

## 2019-11-02 DIAGNOSIS — Z940 Kidney transplant status: Secondary | ICD-10-CM

## 2019-11-02 DIAGNOSIS — I10 Essential (primary) hypertension: Secondary | ICD-10-CM | POA: Diagnosis not present

## 2019-11-02 DIAGNOSIS — I340 Nonrheumatic mitral (valve) insufficiency: Secondary | ICD-10-CM

## 2019-11-02 DIAGNOSIS — N186 End stage renal disease: Secondary | ICD-10-CM | POA: Diagnosis not present

## 2019-11-02 DIAGNOSIS — E785 Hyperlipidemia, unspecified: Secondary | ICD-10-CM | POA: Diagnosis not present

## 2019-11-02 NOTE — Patient Instructions (Signed)
Medication Instructions:  No Changes *If you need a refill on your cardiac medications before your next appointment, please call your pharmacy*   Lab Work: None Ordered If you have labs (blood work) drawn today and your tests are completely normal, you will receive your results only by: Marland Kitchen MyChart Message (if you have MyChart) OR . A paper copy in the mail If you have any lab test that is abnormal or we need to change your treatment, we will call you to review the results.   Testing/Procedures:  Your physician has requested that you have an echocardiogram. Echocardiography is a painless test that uses sound waves to create images of your heart. It provides your doctor with information about the size and shape of your heart and how well your heart's chambers and valves are working. This procedure takes approximately one hour. There are no restrictions for this procedure.    Follow-Up: At [facility], you and your health needs are our priority.  As part of our continuing mission to provide you with exceptional heart care, we have created designated Provider Care Teams.  These Care Teams include your primary Cardiologist (physician) and Advanced Practice Providers (APPs -  Physician Assistants and Nurse Practitioners) who all work together to provide you with the care you need, when you need it.  We recommend signing up for the patient portal called "MyChart".  Sign up information is provided on this After Visit Summary.  MyChart is used to connect with patients for Virtual Visits (Telemedicine).  Patients are able to view lab/test results, encounter notes, upcoming appointments, etc.  Non-urgent messages can be sent to your provider as well.   To learn more about what you can do with MyChart, go to [email or web address].    Your next appointment:   6 month(s)  The format for your next appointment:   In Person  Provider:    You may see Ida Rogue, MD or one of the following  Advanced Practice Providers on your designated Care Team:    - Christell Faith, PA-C     Other Instructions N/A

## 2019-11-07 DIAGNOSIS — Z79899 Other long term (current) drug therapy: Principal | ICD-10-CM

## 2019-11-07 DIAGNOSIS — Z940 Kidney transplant status: Principal | ICD-10-CM

## 2019-11-09 MED FILL — PREGABALIN 75 MG CAPSULE: 30 days supply | Qty: 90 | Fill #2 | Status: AC

## 2019-11-09 MED FILL — CARVEDILOL 12.5 MG TABLET: ORAL | 30 days supply | Qty: 180 | Fill #6

## 2019-11-09 MED FILL — CARVEDILOL 12.5 MG TABLET: 30 days supply | Qty: 180 | Fill #6 | Status: AC

## 2019-11-09 MED FILL — PREGABALIN 75 MG CAPSULE: ORAL | 30 days supply | Qty: 90 | Fill #2

## 2019-11-13 ENCOUNTER — Ambulatory Visit
Admission: RE | Admit: 2019-11-13 | Discharge: 2019-11-13 | Disposition: A | Discharge status: Home / Self Care | Payer: Medicare Other | Source: Ambulatory Visit | Attending: Nurse Practitioner | Admitting: Nurse Practitioner

## 2019-11-13 DIAGNOSIS — G8929 Other chronic pain: Secondary | ICD-10-CM | POA: Diagnosis not present

## 2019-11-13 DIAGNOSIS — M5416 Radiculopathy, lumbar region: Secondary | ICD-10-CM | POA: Diagnosis not present

## 2019-11-13 DIAGNOSIS — M5441 Lumbago with sciatica, right side: Secondary | ICD-10-CM | POA: Diagnosis not present

## 2019-11-13 DIAGNOSIS — M545 Low back pain: Secondary | ICD-10-CM | POA: Diagnosis not present

## 2019-11-14 DIAGNOSIS — Z79899 Other long term (current) drug therapy: Principal | ICD-10-CM

## 2019-11-14 DIAGNOSIS — Z940 Kidney transplant status: Principal | ICD-10-CM

## 2019-11-14 NOTE — Unmapped (Signed)
[facility] Refill Coordination Note    Specialty Medication(s) to be Shipped:   Transplant: Envarsus 4mg     Other medication(s) to be shipped: amlodipine 10mg , atorvastatin 40mg , clonidine 0.1mg  and metformin 500mg      Michele Cisneros, DOB: 1956/03/15  Phone: [phone] (home)       All above HIPAA information was verified with patient.     Was a Nurse, learning disability used for this call? No    Completed refill call assessment today to schedule patient's medication shipment from the [facility] [phone]).       Specialty medication(s) and dose(s) confirmed: Regimen is correct and unchanged.   Changes to medications: Michele Cisneros reports no changes at this time.  Changes to insurance: No  Questions for the pharmacist: No    Confirmed patient received Welcome Packet with first shipment. The patient will receive a drug information handout for each medication shipped and additional FDA Medication Guides as required.       DISEASE/MEDICATION-SPECIFIC INFORMATION        N/A    SPECIALTY MEDICATION ADHERENCE     Medication Adherence    Patient reported X missed doses in the last month: 0  Specialty Medication: Envarsus 4mg   Patient is on additional specialty medications: No        Envarsus 4 mg: 6 days of medicine on hand     SHIPPING     Shipping address confirmed in Epic.     Delivery Scheduled: Yes, Expected medication delivery date: 11/18/2019.     Medication will be delivered via Next Day Courier to the prescription address in Epic WAM.    Michele Cisneros   [facility] Specialty Technician

## 2019-11-17 MED FILL — CLONIDINE HCL 0.1 MG TABLET: 30 days supply | Qty: 60 | Fill #1 | Status: AC

## 2019-11-17 MED FILL — METFORMIN ER 500 MG TABLET,EXTENDED RELEASE 24 HR: ORAL | 30 days supply | Qty: 90 | Fill #4

## 2019-11-17 MED FILL — ENVARSUS XR 4 MG TABLET,EXTENDED RELEASE: 30 days supply | Qty: 90 | Fill #3 | Status: AC

## 2019-11-17 MED FILL — ENVARSUS XR 4 MG TABLET,EXTENDED RELEASE: ORAL | 30 days supply | Qty: 90 | Fill #3

## 2019-11-17 MED FILL — METFORMIN ER 500 MG TABLET,EXTENDED RELEASE 24 HR: 30 days supply | Qty: 90 | Fill #4 | Status: AC

## 2019-11-17 MED FILL — AMLODIPINE 10 MG TABLET: 30 days supply | Qty: 30 | Fill #6 | Status: AC

## 2019-11-17 MED FILL — AMLODIPINE 10 MG TABLET: ORAL | 30 days supply | Qty: 30 | Fill #6

## 2019-11-17 MED FILL — CLONIDINE HCL 0.1 MG TABLET: ORAL | 30 days supply | Qty: 60 | Fill #1

## 2019-11-17 MED FILL — ATORVASTATIN 40 MG TABLET: 90 days supply | Qty: 90 | Fill #0 | Status: AC

## 2019-11-18 ENCOUNTER — Other Ambulatory Visit: Payer: Self-pay | Admitting: Nurse Practitioner

## 2019-11-18 DIAGNOSIS — G959 Disease of spinal cord, unspecified: Secondary | ICD-10-CM

## 2019-11-21 ENCOUNTER — Telehealth: Payer: Self-pay | Admitting: Internal Medicine

## 2019-11-21 DIAGNOSIS — Z79899 Other long term (current) drug therapy: Principal | ICD-10-CM

## 2019-11-21 DIAGNOSIS — Z940 Kidney transplant status: Principal | ICD-10-CM

## 2019-11-21 NOTE — Telephone Encounter (Signed)
Left message for patient to call back and schedule Medicare Annual Wellness Visit (AWV) either virtually or audio only.  Last AWV 03/04/18; please schedule at anytime with Denisa O'Brien-Blaney at [facility].

## 2019-11-24 ENCOUNTER — Ambulatory Visit
Admission: RE | Admit: 2019-11-24 | Discharge: 2019-11-24 | Disposition: A | Discharge status: Home / Self Care | Payer: Medicare Other | Source: Ambulatory Visit | Attending: Nurse Practitioner | Admitting: Nurse Practitioner

## 2019-11-24 ENCOUNTER — Other Ambulatory Visit: Payer: Self-pay

## 2019-11-24 DIAGNOSIS — G959 Disease of spinal cord, unspecified: Secondary | ICD-10-CM

## 2019-11-28 DIAGNOSIS — Z940 Kidney transplant status: Principal | ICD-10-CM

## 2019-11-28 DIAGNOSIS — Z79899 Other long term (current) drug therapy: Principal | ICD-10-CM

## 2019-11-29 LAB — MICROSCOPIC EXAMINATION
CASTS: NONE SEEN /LPF
EPITHELIAL CELLS (NON RENAL): NONE SEEN /HPF (ref 0–10)
RBC URINE: NONE SEEN /HPF (ref 0–2)

## 2019-11-29 LAB — URINALYSIS
BILIRUBIN UA: NEGATIVE
BLOOD UA: NEGATIVE
GLUCOSE UA: NEGATIVE
KETONES UA: NEGATIVE
LEUKOCYTE ESTERASE UA: NEGATIVE
NITRITE UA: NEGATIVE
PH UA: 7.5 (ref 5.0–7.5)
PROTEIN UA: NEGATIVE
SPECIFIC GRAVITY UA: 1.012 (ref 1.005–1.030)

## 2019-11-29 LAB — COLOR

## 2019-11-29 LAB — RBC URINE: Erythrocytes:Naric:Pt:Urine sed:Qn:Microscopy.light.HPF: NONE SEEN

## 2019-11-30 DIAGNOSIS — Z8639 Personal history of other endocrine, nutritional and metabolic disease: Principal | ICD-10-CM

## 2019-11-30 DIAGNOSIS — Z940 Kidney transplant status: Principal | ICD-10-CM

## 2019-11-30 LAB — BASIC METABOLIC PANEL
BLOOD UREA NITROGEN: 24 mg/dL
CALCIUM: 10.3 mg/dL
CHLORIDE: 104 mmol/L
CO2: 19 mmol/L — ABNORMAL LOW
POTASSIUM: 6 mmol/L — ABNORMAL HIGH
SODIUM: 135 mmol/L

## 2019-11-30 LAB — CBC W/ DIFFERENTIAL
BASOPHILS ABSOLUTE COUNT: 0 10*9/L
EOSINOPHILS ABSOLUTE COUNT: 0.1 10*9/L
EOSINOPHILS RELATIVE PERCENT: 2 %
HEMATOCRIT: 32.9 % — ABNORMAL LOW
LYMPHOCYTES ABSOLUTE COUNT: 0.4 10*9/L — ABNORMAL LOW
LYMPHOCYTES RELATIVE PERCENT: 9 %
MEAN CORPUSCULAR HEMOGLOBIN CONC: 32.5 g/dL
MEAN CORPUSCULAR HEMOGLOBIN: 30.7 pg
MEAN CORPUSCULAR VOLUME: 95 fL
MONOCYTES ABSOLUTE COUNT: 0.3 10*9/L
MONOCYTES RELATIVE PERCENT: 6 %
NEUTROPHILS ABSOLUTE COUNT: 3.6 10*9/L
PLATELET COUNT: 167 10*9/L
RED BLOOD CELL COUNT: 3.48 10*12/L — ABNORMAL LOW
RED CELL DISTRIBUTION WIDTH: 15.7 % — ABNORMAL HIGH
WHITE BLOOD CELL COUNT: 4.4 10*9/L

## 2019-11-30 LAB — URINALYSIS
BILIRUBIN UA: NEGATIVE
BLOOD UA: NEGATIVE
GLUCOSE UA: NEGATIVE
KETONES UA: NEGATIVE
NITRITE UA: NEGATIVE
PH UA: 7.5
PROTEIN UA: NEGATIVE
UROBILINOGEN UA: 0.2

## 2019-11-30 LAB — NITRITE UA: Lab: NEGATIVE

## 2019-11-30 LAB — PHOSPHORUS: Lab: 3.9

## 2019-11-30 LAB — MAGNESIUM: Lab: 2.2

## 2019-11-30 LAB — SODIUM: Lab: 135

## 2019-11-30 LAB — TACROLIMUS, TROUGH: Lab: 14.7

## 2019-11-30 LAB — RED BLOOD CELL COUNT: Lab: 3.48 — ABNORMAL LOW

## 2019-11-30 MED ORDER — TACROLIMUS XR 4 MG TABLET,EXTENDED RELEASE 24 HR
ORAL_TABLET | Freq: Every morning | ORAL | 11 refills | 30 days | Status: CP
Start: 2019-11-30 — End: ?
  Filled 2020-01-02: qty 60, 30d supply, fill #0

## 2019-11-30 NOTE — Unmapped (Signed)
Per Herbert Seta NP hold dose of envarsus and then start 8mg  envarsus.    Reports she is doing well. Stronger everyday and hoping to continue get stronger and hoping to get rid of cane soon.    Reviewed labs with patient and answered questions. BP 110-120 at home    Let her know she is due for f/u apt in end of July and US thyroid.  Will get mammogram locally.    Denies any other needs at this time

## 2019-12-01 ENCOUNTER — Ambulatory Visit (INDEPENDENT_AMBULATORY_CARE_PROVIDER_SITE_OTHER): Payer: Medicare Other

## 2019-12-01 VITALS — BP 109/57 | HR 72 | Ht 67.5 in | Wt 144.0 lb

## 2019-12-01 DIAGNOSIS — Z1231 Encounter for screening mammogram for malignant neoplasm of breast: Secondary | ICD-10-CM

## 2019-12-01 DIAGNOSIS — Z0000 Encounter for general adult medical examination without abnormal findings: Secondary | ICD-10-CM | POA: Diagnosis not present

## 2019-12-01 NOTE — Progress Notes (Addendum)
Subjective:   Theresa [name] is a 64 y.o. female who presents for Medicare Annual (Subsequent) preventive examination.  Review of Systems    No ROS.  Medicare Wellness Virtual Visit.   Cardiac Risk Factors include: advanced age (>69men, >78 women);diabetes mellitus;hypertension     Objective:    Today's Vitals   12/01/19 1304  BP: (!) 109/57  Pulse: 72  Weight: 144 lb (65.3 kg)  Height: 5' 7.5" (1.715 m)   Body mass index is 22.22 kg/m.  Advanced Directives 12/01/2019 02/08/2019 03/11/2018 03/04/2018 01/12/2018 09/26/2017 08/20/2017  Does Patient Have a Medical Advance Directive? Yes No Yes No No Yes No  Type of Paramedic of [place];Living will - [place];Living will - - [place];Living will -  Does patient want to make changes to medical advance directive? No - Patient declined - - Yes (MAU/Ambulatory/Procedural Areas - Information given) - - -  Copy of [place] in Chart? No - copy requested - No - copy requested - - No - copy requested -  Would patient like information on creating a medical advance directive? - No - Patient declined - - No - Patient declined - No - Patient declined  Pre-existing out of facility DNR order (yellow form or pink MOST form) - - - - - - -    Current Medications (verified) Outpatient Encounter Medications as of 12/01/2019  Medication Sig  . albuterol (PROVENTIL) (2.5 MG/3ML) 0.083% nebulizer solution Take 3 mLs (2.5 mg total) by nebulization every 6 (six) hours as needed for wheezing or shortness of breath.  Marland Kitchen albuterol (VENTOLIN HFA) 108 (90 Base) MCG/ACT inhaler Inhale 1-2 puffs into the lungs every 6 (six) hours as needed for wheezing or shortness of breath.  Marland Kitchen amLODipine (NORVASC) 10 MG tablet Take 10 mg by mouth daily.   Marland Kitchen aspirin EC 81 MG tablet Take 1 tablet (81 mg total) by mouth at bedtime.  Marland Kitchen atorvastatin (LIPITOR) 40 MG tablet Take 40 mg by mouth daily at 6 PM.    . carvedilol (COREG) 25 MG tablet Take 1.5 tablets (37.5 mg total) by mouth 2 (two) times daily with a meal.  . cetirizine (ZYRTEC) 5 MG tablet Take 1 tablet (5 mg total) by mouth daily as needed for allergies. (Patient taking differently: Take 5 mg by mouth daily. )  . lidocaine (XYLOCAINE) 2 % solution   . magnesium oxide (MAG-OX) 400 MG tablet Take by mouth.  . melatonin 3 MG TABS tablet Take by mouth.  . metFORMIN (GLUCOPHAGE) 500 MG tablet Take by mouth 2 (two) times daily with a meal.  . metoCLOPramide (REGLAN) 5 MG tablet Take by mouth.  . mycophenolate (MYFORTIC) 360 MG TBEC EC tablet Take 360 mg by mouth 2 (two) times daily.  . nitroGLYCERIN (NITROSTAT) 0.4 MG SL tablet Place 1 tablet (0.4 mg total) under the tongue every 5 (five) minutes as needed for chest pain.  . pregabalin (LYRICA) 75 MG capsule Take 1 capsule (75 mg total) by mouth 2 (two) times daily.  . Tacrolimus ER (ENVARSUS XR) 1 MG TB24 Take 2 tablets by mouth every morning.  . Tacrolimus ER 4 MG TB24 Take 2 tablets by mouth every morning. Total dose 14 mg daily in am  . UNABLE TO FIND TEST BLOOD SUGARS 2 TIMES DAILY  . valGANciclovir (VALCYTE) 450 MG tablet Take 450 mg by mouth daily.   No facility-administered encounter medications on file as of 12/01/2019.  Allergies (verified) Hydrocodone, Metformin, and Lisinopril   History: Past Medical History:  Diagnosis Date  . Anemia of chronic disease   . Anginal pain ([place])   . Carotid arterial disease ([place])    a. 02/2013 U/S: 40-59% bilat ICA stenosis.  . Chronic constipation   . Chronic diastolic CHF (congestive heart failure) ([place])    a. 10/2013 Echo [facility]): EF 55-60%, mod conc LVH, mod MR, mildly dil LA, mild Ao sclerosis w/o stenosis; b. 02/2016 Echo: EF 55-60%, no rwma, Gr DD, mild AS, mod to sev MR, mildly dil LA, PASP 71mmHg; c. 07/2017 Echo [facility]): EF >55%, mild to mod LVH, Gr2 DD, Ao scl, Mod MR, mod dil LA, nl RV fxn.  . Colon polyps   . COPD (chronic  obstructive pulmonary disease) ([place])   . Coronary artery disease    a. 05/2013 NSTEMI/PCI: LM 20d, LAD min irregs, LCX small, nl, OM1 nl, RCA dom 69m (2.5x16 Promus DES), PDA1 80p; b. 04/2015 Cath: LM nl, LAD 22m, D1/2 min irregs, LCX 35p/m, OM2/3 min irregs, RCA patent mid stent, RPDA 70ost, RPLB1 30, RPLB2/3 min irregs, EF 55-65%--> Med Rx; c. 06/2017 MV [facility]): No ischemia/infarct, EF 64%.  . Diabetes mellitus   . Diabetic neuropathy ([place])   . Diabetic retinopathy ([place]) 05/28/2013   Hx bilat retinal detachment, proliferative diab retinopathy and bilat vitreous hemorrhage   . Emphysema   . ESRD on hemodialysis ([place])    a. [facility] in [place], [place] [phone]/Dr. [name], on a MWF schedule.  She started dialysis in Feb 2014.  Etiology of renal failure not known, likely diabetes.  Has a left upper arm AV graft.  . History of bronchitis    Mar 2012  . History of pneumonia    June 2012  . History of tobacco abuse    a. Quit 2012.  Marland Kitchen Hyperlipidemia   . Hypertension   . Moderate to severe mitral insufficiency    a. 10/2013 Echo: EF 55-60%, mod MR; b. 02/2016 Echo: EF 55-60%, mod to sev MR directed posteriorly--felt to be dynamic -worse with volume overload; c. 07/2017 Echo [facility]): EF >55%. Mod MR.  . Myocardial infarct ([place]) 05/2013  . Peripheral vascular disease ([place])   . Sickle cell trait ([place])   . Thyroid nodule    [place] 06/2017 due to f/u US in 1 year    Past Surgical History:  Procedure Laterality Date  . A/V FISTULAGRAM Left 11/10/2017   Procedure: A/V FISTULAGRAM;  Surgeon: Katha Cabal, MD;  Location: [place] CV LAB;  Service: Cardiovascular;  Laterality: Left;  . A/V SHUNTOGRAM Left 02/08/2019   Procedure: A/V SHUNTOGRAM;  Surgeon: Katha Cabal, MD;  Location: [place] CV [facility];  Service: Cardiovascular;  Laterality: Left;  . ABDOMINAL HYSTERECTOMY     2000  . CARDIAC CATHETERIZATION    . CARDIAC CATHETERIZATION N/A 05/02/2015   Procedure: Left Heart Cath and  Coronary Angiography;  Surgeon: [name], MD;  Location: [place] CV LAB;  Service: Cardiovascular;  Laterality: N/A;  . COLONOSCOPY WITH PROPOFOL N/A 07/08/2016   Procedure: COLONOSCOPY WITH PROPOFOL;  Surgeon: [name], MD;  Location: [facility] ENDOSCOPY;  Service: Endoscopy;  Laterality: N/A;  . COLONOSCOPY WITH PROPOFOL N/A 08/20/2017   Procedure: COLONOSCOPY WITH PROPOFOL;  Surgeon: [name], MD;  Location: [facility] ENDOSCOPY;  Service: Gastroenterology;  Laterality: N/A;  . colonscopy    . CORONARY ANGIOPLASTY  05/28/2014   stent placement to the mid RCA  . CYSTOURETHROSCOPY  [facility] urology with stent placement 11 or 05/2019 and stent removal 06/13/19   . DILATION AND CURETTAGE OF UTERUS     several in the early 80's  . ESOPHAGOGASTRODUODENOSCOPY     2012  . ESOPHAGOGASTRODUODENOSCOPY (EGD) WITH PROPOFOL N/A 03/11/2018   Procedure: ESOPHAGOGASTRODUODENOSCOPY (EGD) WITH PROPOFOL;  Surgeon: [name], MD;  Location: [facility] ENDOSCOPY;  Service: Gastroenterology;  Laterality: N/A;  . EYE SURGERY     bilateral laser 2012  . EYE SURGERY     right  . EYE SURGERY     x4 both eyes  . GAS INSERTION  09/30/2011   Procedure: INSERTION OF GAS;  Surgeon: [name], MD;  Location: [place];  Service: Ophthalmology;  Laterality: Right;  [id number]  . GAS/FLUID EXCHANGE  09/30/2011   Procedure: GAS/FLUID EXCHANGE;  Surgeon: [name], MD;  Location: [place];  Service: Ophthalmology;  Laterality: Right;  . KIDNEY TRANSPLANT     03/30/19 Dr. Mickel Baas Thomas/Dr. Cristie Hem Zendel/Dr. Gilford Raid; [facility] transplant Dr. Horald Chestnut will be primary [phone] coordinator Sarah Wynkoop [phone]  . KIDNEY TRANSPLANT     03/30/19 [facility]  . LEFT HEART CATHETERIZATION WITH CORONARY ANGIOGRAM N/A 05/28/2013   Procedure: LEFT HEART CATHETERIZATION WITH CORONARY ANGIOGRAM;  Surgeon: [name], MD;  Location: [facility] CATH LAB;  Service: Cardiovascular;  Laterality: N/A;  . PARS PLANA VITRECTOMY  04/22/2011    Procedure: PARS PLANA VITRECTOMY WITH 25 GAUGE;  Surgeon: [name], MD;  Location: [place];  Service: Ophthalmology;  Laterality: Left;  membrane peel, endolaser, gas fluid exchange, silicone oil, repair of complex traction retinal detachment  . PARS PLANA VITRECTOMY  09/30/2011   Procedure: PARS PLANA VITRECTOMY WITH 25 GAUGE;  Surgeon: [name], MD;  Location: [place];  Service: Ophthalmology;  Laterality: Right;  Endolaser; Repair of Complex Traction Retinal Detachment  . PARS PLANA VITRECTOMY  02/24/2012   Procedure: PARS PLANA VITRECTOMY WITH 25 GAUGE;  Surgeon: [name], MD;  Location: [place];  Service: Ophthalmology;  Laterality: Left;  . PTCA    . SILICON OIL REMOVAL  8/93/7342   Procedure: SILICON OIL REMOVAL;  Surgeon: [name], MD;  Location: [place];  Service: Ophthalmology;  Laterality: Left;  . THROMBECTOMY / ARTERIOVENOUS GRAFT REVISION    . TUBAL LIGATION     1979  . UPPER EXTREMITY ANGIOGRAPHY Left 02/08/2019   Procedure: UPPER EXTREMITY ANGIOGRAPHY;  Surgeon: Katha Cabal, MD;  Location: [place] CV LAB;  Service: Cardiovascular;  Laterality: Left;   Family History  Problem Relation Age of Onset  . Stroke Mother   . Heart attack Mother   . Heart disease Mother   . Colon cancer Father   . Colon cancer Sister   . Heart attack Brother   . Stroke Brother   . Diabetes Brother   . Breast cancer Sister   . Diabetes Brother   . Diabetes Daughter   . Renal Disease Daughter        on HD  . Anesthesia problems Neg Hx   . Hypotension Neg Hx   . Malignant hyperthermia Neg Hx   . Pseudochol deficiency Neg Hx    Social History   Socioeconomic History  . Marital status: Single    Spouse name: Not on file  . Number of children: 2  . Years of education: Not on file  . Highest education level: Not on file  Occupational History  . Not on file  Tobacco Use  . Smoking status: Former Smoker    Packs/day: 1.00    Years: 25.00    Pack years: 25.00      Quit date: 06/09/2010    Years since quitting: 9.4  . Smokeless tobacco: Never Used  Vaping Use  . Vaping Use: Never used  Substance and Sexual Activity  . Alcohol use: No  . Drug use: No  . Sexual activity: Never  Other Topics Concern  . Not on file  Social History Narrative   On disability.    Lives with son Brenton Grills   2 children (has son and daughter)      Son drives her since her vision has decreased   Social Determinants of Radio broadcast assistant Strain:   . Difficulty of Paying Living Expenses:   Food Insecurity:   . Worried About Charity fundraiser in the Last Year:   . Arboriculturist in the Last Year:   Transportation Needs:   . Film/video editor (Medical):   Marland Kitchen Lack of Transportation (Non-Medical):   Physical Activity:   . Days of Exercise per Week:   . Minutes of Exercise per Session:   Stress:   . Feeling of Stress :   Social Connections:   . Frequency of Communication with Friends and Family:   . Frequency of Social Gatherings with Friends and Family:   . Attends Religious Services:   . Active Member of Clubs or Organizations:   . Attends Archivist Meetings:   Marland Kitchen Marital Status:     Tobacco Counseling Counseling given: Not Answered   Clinical Intake:  Pre-visit preparation completed: Yes        Diabetes: Yes (Follwed by pcp)  How often do you need to have someone help you when you read instructions, pamphlets, or other written materials from your doctor or pharmacy?: 1 - Never  Interpreter Needed?: No      Activities of Daily Living In your present state of health, do you have any difficulty performing the following activities: 12/01/2019 02/08/2019  Hearing? N N  Vision? N N  Difficulty concentrating or making decisions? N N  Walking or climbing stairs? Y N  Dressing or bathing? N N  Preparing Food and eating ? Y -  Using the Toilet? N -  In the past six months, have you accidently leaked urine? N -  Do you have  problems with loss of bowel control? N -  Managing your Medications? Y -  Managing your Finances? N -  Housekeeping or managing your Housekeeping? Y -  Some recent data might be hidden    Patient Care Team: McLean-Scocuzza, Nino Glow, MD as PCP - General (Internal Medicine) Minna Merritts, MD as PCP - Cardiology (Cardiology) Alisa Graff, FNP as Nurse Practitioner (Family Medicine) Minna Merritts, MD as Consulting Physician (Cardiology) Anthonette Legato, MD as Consulting Physician (Nephrology)  Indicate any recent Medical Services you may have received from other than [facility] providers in the past year (date may be approximate).     Assessment:   This is a routine wellness examination for Theresa [name].  I connected with Theresa [name] today by telephone and verified that I am speaking with the correct person using two identifiers. Location patient: home Location provider: work Persons participating in the virtual visit: patient, Marine scientist.    I discussed the limitations, risks, security and privacy concerns of performing an evaluation and management service by telephone and the availability of in  person appointments. The patient expressed understanding and verbally consented to this telephonic visit.    Interactive audio and video telecommunications were attempted between this provider and patient, however failed, due to patient having technical difficulties OR patient did not have access to video capability.  We continued and completed visit with audio only.  Some vital signs may be absent or patient reported.   Hearing/Vision screen  Hearing Screening   125Hz  250Hz  500Hz  1000Hz  2000Hz  3000Hz  4000Hz  6000Hz  8000Hz   Right ear:           Left ear:           Comments: Patient is able to hear conversational tones without difficulty.  No issues reported.  Vision Screening Comments: Followed by Dr. Marvel Plan Wears corrective lenses Cataract extraction, bilateral Visual acuity not assessed, virtual  visit.  They have seen their ophthalmologist in the last 12 months.     Dietary issues and exercise activities discussed: Current Exercise Habits: Home exercise routine, Type of exercise: walking;calisthenics (stationary bike), Intensity: Mild  Goals    . Healthy Lifestyle     Stay active Stay hydrated Healthy diet      Depression Screen PHQ 2/9 Scores 12/01/2019 06/16/2019 03/23/2018 01/05/2018 12/29/2017 09/10/2017 08/11/2017  PHQ - 2 Score 0 0 0 0 0 0 0    Fall Risk Fall Risk  12/01/2019 06/16/2019 03/23/2018 01/05/2018 12/29/2017  Falls in the past year? 1 0 No No No  Number falls in past yr: 1 - - - -  Injury with Fall? 0 - - - -  Risk for fall due to : Impaired balance/gait - - - -  Risk for fall due to: Comment Cane in use. Notes last fall several months ago. - - - -  Follow up Falls evaluation completed - - - -    Handrails in use when climbing stairs? Yes  Home free of loose throw rugs in walkways, pet beds, electrical cords, etc? Yes  Adequate lighting in your home to reduce risk of falls? Yes   ASSISTIVE DEVICES UTILIZED TO PREVENT FALLS:  Life alert? No  Use of a cane, walker or w/c? Yes  Grab bars in the bathroom? No  Shower chair or bench in shower? No  Elevated toilet seat or a handicapped toilet? No   TIMED UP AND GO:  Was the test performed? No .   Cognitive Function:     6CIT Screen 12/01/2019 03/04/2018 02/24/2017  What Year? 0 points 0 points 0 points  What month? 0 points 0 points 0 points  What time? - 0 points 0 points  Count back from 20 - 0 points 0 points  Months in reverse 0 points 0 points 0 points  Repeat phrase 0 points 0 points 0 points  Total Score - 0 0    Immunizations Immunization History  Administered Date(s) Administered  . Hepatitis B 12/16/2012, 01/15/2013, 03/05/2013, 06/18/2013  . Hepatitis B, adult 12/16/2012, 01/15/2013, 03/05/2013, 06/18/2013  . Influenza Nasal 03/06/2015  . Influenza Split 02/25/2012, 02/07/2013, 02/19/2015,  03/06/2015  . Influenza, High Dose Seasonal PF 02/09/2018  . Influenza,inj,Quad PF,6+ Mos 02/21/2014, 02/24/2017  . Influenza-Unspecified 02/25/2012, 03/01/2018, 03/18/2019  . PPD Test 07/16/2012, 11/12/2012, 01/17/2015, 05/12/2016, 05/13/2017, 05/12/2018  . Pneumococcal Polysaccharide-23 06/09/2010, 03/23/2018  . Pneumococcal-Unspecified 06/09/2010, 07/04/2015, 03/23/2018  . Tetanus 06/09/2010   Zostavax completed No   Shingrix Completed?: No.    Education has been provided regarding the importance of this vaccine. Patient has been advised to call insurance company to  determine out of pocket expense if they have not yet received this vaccine. Advised may also receive vaccine at local pharmacy or Health Dept. Verbalized acceptance and understanding.  Health Maintenance Health Maintenance  Topic Date Due  . URINE MICROALBUMIN  01/07/2014  . PAP SMEAR-Modifier  11/15/2019  . HEMOGLOBIN A1C  11/18/2019  . COVID-19 Vaccine (1) 12/17/2019 (Originally 08/02/1967)  . INFLUENZA VACCINE  01/08/2020  . MAMMOGRAM  03/02/2020  . TETANUS/TDAP  06/09/2020  . COLONOSCOPY  08/20/2020  . FOOT EXAM  10/09/2020  . OPHTHALMOLOGY EXAM  10/23/2020  . PNEUMOCOCCAL POLYSACCHARIDE VACCINE AGE 82-64 HIGH RISK  Completed  . Hepatitis C Screening  Completed  . HIV Screening  Completed   Pap smear- patient notes hysterectomy and no longer receiving pap smears. Deferred.   Labs followed by pcp  Dental Screening: Recommended annual dental exams for proper oral hygiene  Community Resource Referral / Chronic Care Management: CRR required this visit?  No  CCM required this visit?  No     Plan:   Keep all routine maintenance appointments.   Mammogram ordered- call [phone] to schedule.   Advanced directives: End of life planning; Advance aging; Advanced directives discussed.  Copy of current HCPOA/Living Will requested.    I have personally reviewed and noted the following in the patient's chart:    . Medical and social history . Use of alcohol, tobacco or illicit drugs  . Current medications and supplements . Functional ability and status . Nutritional status . Physical activity . Advanced directives . List of other physicians . Hospitalizations, surgeries, and ER visits in previous 12 months . Vitals . Screenings to include cognitive, depression, and falls . Referrals and appointments  In addition, I have reviewed and discussed with patient certain preventive protocols, quality metrics, and best practice recommendations. A written personalized care plan for preventive services as well as general preventive health recommendations were provided to patient via mail.     Varney Biles, LPN   6/38/7564    Reviewed above information.  Agree with assessment and plan.    Dr Nicki Reaper

## 2019-12-01 NOTE — Addendum Note (Signed)
Addended by: Dia Crawford on: 12/01/2019 03:21 PM   Modules accepted: Orders, SmartSet

## 2019-12-01 NOTE — Patient Instructions (Addendum)
[name] , Thank you for taking time to come for your Medicare Wellness Visit. I appreciate your ongoing commitment to your health goals. Please review the following plan we discussed and let me know if I can assist you in the future.   These are the goals we discussed: Goals     Healthy Lifestyle     Stay active Stay hydrated Healthy diet       This is a list of the screening recommended for you and due dates:  Health Maintenance  Topic Date Due   Urine Protein Check  01/07/2014   Pap Smear  11/15/2019   Hemoglobin A1C  11/18/2019   COVID-19 Vaccine (1) 12/17/2019*   Flu Shot  01/08/2020   Mammogram  03/02/2020   Tetanus Vaccine  06/09/2020   Colon Cancer Screening  08/20/2020   Complete foot exam   10/09/2020   Eye exam for diabetics  10/23/2020   Pneumococcal vaccine  Completed    Hepatitis C: One time screening is recommended by Center for Disease Control  (CDC) for  adults born from [birth year] through 1963.   Completed   HIV Screening  Completed  *Topic was postponed. The date shown is not the original due date.    Immunizations Immunization History  Administered Date(s) Administered   Hepatitis B 12/16/2012, 01/15/2013, 03/05/2013, 06/18/2013   Hepatitis B, adult 12/16/2012, 01/15/2013, 03/05/2013, 06/18/2013   Influenza Nasal 03/06/2015   Influenza Split 02/25/2012, 02/07/2013, 02/19/2015, 03/06/2015   Influenza, High Dose Seasonal PF 02/09/2018   Influenza,inj,Quad PF,6+ Mos 02/21/2014, 02/24/2017   Influenza-Unspecified 02/25/2012, 03/01/2018, 03/18/2019   PPD Test 07/16/2012, 11/12/2012, 01/17/2015, 05/12/2016, 05/13/2017, 05/12/2018   Pneumococcal Polysaccharide-23 06/09/2010, 03/23/2018   Pneumococcal-Unspecified 06/09/2010, 07/04/2015, 03/23/2018   Tetanus 06/09/2010   Mammogram ordered- call [phone] to schedule.   Advanced directives: End of life planning; Advance aging; Advanced directives discussed.  Copy of current  HCPOA/Living Will requested.    Conditions/risks identified: none new  Follow up in one year for your annual wellness visit.   Preventive Care 40-64 Years, Female Preventive care refers to lifestyle choices and visits with your health care provider that can promote health and wellness. What does preventive care include?  A yearly physical exam. This is also called an annual well check.  Dental exams once or twice a year.  Routine eye exams. Ask your health care provider how often you should have your eyes checked.  Personal lifestyle choices, including:  Daily care of your teeth and gums.  Regular physical activity.  Eating a healthy diet.  Avoiding tobacco and drug use.  Limiting alcohol use.  Practicing safe sex.  Taking low-dose aspirin daily starting at age 59.  Taking vitamin and mineral supplements as recommended by your health care provider. What happens during an annual well check? The services and screenings done by your health care provider during your annual well check will depend on your age, overall health, lifestyle risk factors, and family history of disease. Counseling  Your health care provider may ask you questions about your:  Alcohol use.  Tobacco use.  Drug use.  Emotional well-being.  Home and relationship well-being.  Sexual activity.  Eating habits.  Work and work Statistician.  Method of birth control.  Menstrual cycle.  Pregnancy history. Screening  You may have the following tests or measurements:  Height, weight, and BMI.  Blood pressure.  Lipid and cholesterol levels. These may be checked every 5 years, or more frequently if you are over 50 years  old.  Skin check.  Lung cancer screening. You may have this screening every year starting at age 57 if you have a 30-pack-year history of smoking and currently smoke or have quit within the past 15 years.  Fecal occult blood test (FOBT) of the stool. You may have this test  every year starting at age 15.  Flexible sigmoidoscopy or colonoscopy. You may have a sigmoidoscopy every 5 years or a colonoscopy every 10 years starting at age 63.  Hepatitis C blood test.  Hepatitis B blood test.  Sexually transmitted disease (STD) testing.  Diabetes screening. This is done by checking your blood sugar (glucose) after you have not eaten for a while (fasting). You may have this done every 1-3 years.  Mammogram. This may be done every 1-2 years. Talk to your health care provider about when you should start having regular mammograms. This may depend on whether you have a family history of breast cancer.  BRCA-related cancer screening. This may be done if you have a family history of breast, ovarian, tubal, or peritoneal cancers.  Pelvic exam and Pap test. This may be done every 3 years starting at age 50. Starting at age 66, this may be done every 5 years if you have a Pap test in combination with an HPV test.  Bone density scan. This is done to screen for osteoporosis. You may have this scan if you are at high risk for osteoporosis. Discuss your test results, treatment options, and if necessary, the need for more tests with your health care provider. Vaccines  Your health care provider may recommend certain vaccines, such as:  Influenza vaccine. This is recommended every year.  Tetanus, diphtheria, and acellular pertussis (Tdap, Td) vaccine. You may need a Td booster every 10 years.  Zoster vaccine. You may need this after age 40.  Pneumococcal 13-valent conjugate (PCV13) vaccine. You may need this if you have certain conditions and were not previously vaccinated.  Pneumococcal polysaccharide (PPSV23) vaccine. You may need one or two doses if you smoke cigarettes or if you have certain conditions. Talk to your health care provider about which screenings and vaccines you need and how often you need them. This information is not intended to replace advice given to you  by your health care provider. Make sure you discuss any questions you have with your health care provider. Document Released: 06/22/2015 Document Revised: 02/13/2016 Document Reviewed: 03/27/2015 Elsevier Interactive Patient Education  2017 [employer] Prevention in the Home Falls can cause injuries. They can happen to people of all ages. There are many things you can do to make your home safe and to help prevent falls. What can I do on the outside of my home?  Regularly fix the edges of walkways and driveways and fix any cracks.  Remove anything that might make you trip as you walk through a door, such as a raised step or threshold.  Trim any bushes or trees on the path to your home.  Use bright outdoor lighting.  Clear any walking paths of anything that might make someone trip, such as rocks or tools.  Regularly check to see if handrails are loose or broken. Make sure that both sides of any steps have handrails.  Any raised decks and porches should have guardrails on the edges.  Have any leaves, snow, or ice cleared regularly.  Use sand or salt on walking paths during winter.  Clean up any spills in your garage right away.  This includes oil or grease spills. What can I do in the bathroom?  Use night lights.  Install grab bars by the toilet and in the tub and shower. Do not use towel bars as grab bars.  Use non-skid mats or decals in the tub or shower.  If you need to sit down in the shower, use a plastic, non-slip stool.  Keep the floor dry. Clean up any water that spills on the floor as soon as it happens.  Remove soap buildup in the tub or shower regularly.  Attach bath mats securely with double-sided non-slip rug tape.  Do not have throw rugs and other things on the floor that can make you trip. What can I do in the bedroom?  Use night lights.  Make sure that you have a light by your bed that is easy to reach.  Do not use any sheets or blankets that  are too big for your bed. They should not hang down onto the floor.  Have a firm chair that has side arms. You can use this for support while you get dressed.  Do not have throw rugs and other things on the floor that can make you trip. What can I do in the kitchen?  Clean up any spills right away.  Avoid walking on wet floors.  Keep items that you use a lot in easy-to-reach places.  If you need to reach something above you, use a strong step stool that has a grab bar.  Keep electrical cords out of the way.  Do not use floor polish or wax that makes floors slippery. If you must use wax, use non-skid floor wax.  Do not have throw rugs and other things on the floor that can make you trip. What can I do with my stairs?  Do not leave any items on the stairs.  Make sure that there are handrails on both sides of the stairs and use them. Fix handrails that are broken or loose. Make sure that handrails are as long as the stairways.  Check any carpeting to make sure that it is firmly attached to the stairs. Fix any carpet that is loose or worn.  Avoid having throw rugs at the top or bottom of the stairs. If you do have throw rugs, attach them to the floor with carpet tape.  Make sure that you have a light switch at the top of the stairs and the bottom of the stairs. If you do not have them, ask someone to add them for you. What else can I do to help prevent falls?  Wear shoes that:  Do not have high heels.  Have rubber bottoms.  Are comfortable and fit you well.  Are closed at the toe. Do not wear sandals.  If you use a stepladder:  Make sure that it is fully opened. Do not climb a closed stepladder.  Make sure that both sides of the stepladder are locked into place.  Ask someone to hold it for you, if possible.  Clearly mark and make sure that you can see:  Any grab bars or handrails.  First and last steps.  Where the edge of each step is.  Use tools that help you  move around (mobility aids) if they are needed. These include:  Canes.  Walkers.  Scooters.  Crutches.  Turn on the lights when you go into a dark area. Replace any light bulbs as soon as they burn out.  Set up your furniture  so you have a clear path. Avoid moving your furniture around.  If any of your floors are uneven, fix them.  If there are any pets around you, be aware of where they are.  Review your medicines with your doctor. Some medicines can make you feel dizzy. This can increase your chance of falling. Ask your doctor what other things that you can do to help prevent falls. This information is not intended to replace advice given to you by your health care provider. Make sure you discuss any questions you have with your health care provider. Document Released: 03/22/2009 Document Revised: 11/01/2015 Document Reviewed: 06/30/2014 Elsevier Interactive Patient Education  2017 [employer].

## 2019-12-01 NOTE — Unmapped (Signed)
Clinical Assessment Needed For: Dose Change  Medication: Envarsus  Last Fill Date: 11/17/2019  Copay $0.00  Was previous dose already scheduled to fill: No    Notes to Pharmacist: N/A

## 2019-12-05 ENCOUNTER — Ambulatory Visit: Payer: Medicare Other

## 2019-12-05 DIAGNOSIS — Z940 Kidney transplant status: Principal | ICD-10-CM

## 2019-12-05 DIAGNOSIS — Z79899 Other long term (current) drug therapy: Principal | ICD-10-CM

## 2019-12-09 ENCOUNTER — Ambulatory Visit (INDEPENDENT_AMBULATORY_CARE_PROVIDER_SITE_OTHER): Payer: Medicare Other

## 2019-12-09 ENCOUNTER — Other Ambulatory Visit: Payer: Self-pay

## 2019-12-09 DIAGNOSIS — I340 Nonrheumatic mitral (valve) insufficiency: Secondary | ICD-10-CM

## 2019-12-09 MED FILL — PREGABALIN 75 MG CAPSULE: 30 days supply | Qty: 90 | Fill #3 | Status: AC

## 2019-12-09 MED FILL — PREGABALIN 75 MG CAPSULE: ORAL | 30 days supply | Qty: 90 | Fill #3

## 2019-12-12 DIAGNOSIS — Z79899 Other long term (current) drug therapy: Principal | ICD-10-CM

## 2019-12-12 DIAGNOSIS — Z940 Kidney transplant status: Principal | ICD-10-CM

## 2019-12-13 MED FILL — AMLODIPINE 10 MG TABLET: 30 days supply | Qty: 30 | Fill #7 | Status: AC

## 2019-12-13 MED FILL — CARVEDILOL 12.5 MG TABLET: ORAL | 30 days supply | Qty: 180 | Fill #7

## 2019-12-13 MED FILL — AMLODIPINE 10 MG TABLET: ORAL | 30 days supply | Qty: 30 | Fill #7

## 2019-12-13 MED FILL — CARVEDILOL 12.5 MG TABLET: 30 days supply | Qty: 180 | Fill #7 | Status: AC

## 2019-12-13 NOTE — Unmapped (Signed)
Patient denied refills for Envarsus 4mg  at this time, she reports that she has aleast two weeks of medication and she is requesting a call back next week.

## 2019-12-14 ENCOUNTER — Other Ambulatory Visit: Payer: Self-pay | Admitting: *Deleted

## 2019-12-14 DIAGNOSIS — I351 Nonrheumatic aortic (valve) insufficiency: Secondary | ICD-10-CM

## 2019-12-14 DIAGNOSIS — I340 Nonrheumatic mitral (valve) insufficiency: Secondary | ICD-10-CM

## 2019-12-19 DIAGNOSIS — Z79899 Other long term (current) drug therapy: Principal | ICD-10-CM

## 2019-12-19 DIAGNOSIS — Z940 Kidney transplant status: Principal | ICD-10-CM

## 2019-12-21 NOTE — Unmapped (Signed)
left message, called to schedule follow up appointment with Dr. Margaretmary Bayley. She also needs an thyroid ultrasound scheduled ([phone]). Can be scheduled from the recall tab. 7/14 EW,

## 2019-12-26 DIAGNOSIS — Z79899 Other long term (current) drug therapy: Principal | ICD-10-CM

## 2019-12-26 DIAGNOSIS — Z940 Kidney transplant status: Principal | ICD-10-CM

## 2019-12-26 DIAGNOSIS — R200 Anesthesia of skin: Secondary | ICD-10-CM | POA: Diagnosis not present

## 2019-12-26 DIAGNOSIS — M79671 Pain in right foot: Secondary | ICD-10-CM | POA: Diagnosis not present

## 2019-12-26 DIAGNOSIS — M79672 Pain in left foot: Secondary | ICD-10-CM | POA: Diagnosis not present

## 2019-12-26 DIAGNOSIS — M79604 Pain in right leg: Secondary | ICD-10-CM | POA: Diagnosis not present

## 2019-12-26 DIAGNOSIS — M79605 Pain in left leg: Secondary | ICD-10-CM | POA: Diagnosis not present

## 2019-12-26 DIAGNOSIS — R202 Paresthesia of skin: Secondary | ICD-10-CM | POA: Diagnosis not present

## 2019-12-26 DIAGNOSIS — R29898 Other symptoms and signs involving the musculoskeletal system: Secondary | ICD-10-CM | POA: Diagnosis not present

## 2019-12-28 DIAGNOSIS — Z940 Kidney transplant status: Principal | ICD-10-CM

## 2019-12-28 DIAGNOSIS — R197 Diarrhea, unspecified: Principal | ICD-10-CM

## 2019-12-28 NOTE — Unmapped (Signed)
Reports every time she eats something she is having watery diarrhea 5-6 times a day.  Started 5 days ago. One episode of n/v Saturday. No one else at home sick.      Still holding myfortic.    Drinking 4-5 bottles water a day and good appetite    Denies any fever, sob, cough, sore throat, congestion, abdominal pain, urinary problems or changes in medications    Encouraged her to stay well hydrated, eat a bland diet, bring stool sample to [facility] and that I would let her know if Dr Margaretmary Bayley wanted anything else. Verbalized understanding.    Updated Dr Margaretmary Bayley    Requested labs and [facility] stool studies

## 2019-12-29 LAB — CBC W/ DIFFERENTIAL
BASOPHILS ABSOLUTE COUNT: 0 10*9/L
BASOPHILS RELATIVE PERCENT: 0 %
EOSINOPHILS ABSOLUTE COUNT: 0.1 10*9/L
EOSINOPHILS RELATIVE PERCENT: 2 %
HEMOGLOBIN: 10.3 g/dL — ABNORMAL LOW
LYMPHOCYTES ABSOLUTE COUNT: 0.5 10*9/L — ABNORMAL LOW
LYMPHOCYTES RELATIVE PERCENT: 7 %
MEAN CORPUSCULAR HEMOGLOBIN CONC: 32.4 g/dL
MEAN CORPUSCULAR HEMOGLOBIN: 32.2 pg
MEAN CORPUSCULAR VOLUME: 99 fL — ABNORMAL HIGH
MONOCYTES ABSOLUTE COUNT: 0.4 10*9/L
MONOCYTES RELATIVE PERCENT: 6 %
NEUTROPHILS RELATIVE PERCENT: 85 %
PLATELET COUNT: 136 10*9/L — ABNORMAL LOW
RED BLOOD CELL COUNT: 3.2 10*12/L — ABNORMAL LOW
WBC ADJUSTED: 6.7 10*9/L

## 2019-12-29 LAB — URINALYSIS
BILIRUBIN UA: NEGATIVE
BLOOD UA: NEGATIVE
KETONES UA: NEGATIVE
NITRITE UA: NEGATIVE
PH UA: 6.5
PROTEIN UA: NEGATIVE
SPECIFIC GRAVITY UA: 1.007 — AB (ref 1.016–1.022)
UROBILINOGEN UA: 0.2

## 2019-12-29 LAB — WBC ADJUSTED: Lab: 0

## 2019-12-29 LAB — BASIC METABOLIC PANEL
CHLORIDE: 105 mmol/L
CO2: 10.2 mmol/L
CREATININE: 1.3 mg/dL — ABNORMAL HIGH
EGFR CKD-EPI AA FEMALE: 51 mL/min/{1.73_m2} — ABNORMAL LOW
EGFR CKD-EPI NON-AA FEMALE: 44 mL/min/{1.73_m2} — ABNORMAL LOW
POTASSIUM: 5.7 mmol/L — ABNORMAL HIGH

## 2019-12-29 LAB — PHOSPHORUS: Lab: 3.3

## 2019-12-29 LAB — MAGNESIUM: Lab: 1.8

## 2019-12-29 LAB — SPECIFIC GRAVITY UA: Lab: 1.007 — AB

## 2019-12-29 LAB — TACROLIMUS, TROUGH: Lab: 4.9

## 2019-12-29 LAB — EGFR CKD-EPI NON-AA MALE: Lab: 0

## 2019-12-29 LAB — ALBUMIN: Lab: 3.9

## 2019-12-30 MED ORDER — PREGABALIN 75 MG CAPSULE
ORAL_CAPSULE | ORAL | 3 refills | 30.00000 days
Start: 2019-12-30 — End: ?

## 2019-12-30 NOTE — Unmapped (Signed)
[facility] Refill Coordination Note    Specialty Medication(s) to be Shipped:   Transplant: Envarsus 4mg     Other medication(s) to be shipped: magnesium, metformin 500mg , pregabalin (sent rf request) clonidine 0.1mg  and sodium bicarb     Michele Cisneros, DOB: November 14, 1955  Phone: [phone] (home)       All above HIPAA information was verified with patient.     Was a Nurse, learning disability used for this call? No    Completed refill call assessment today to schedule patient's medication shipment from the [facility] [phone]).       Specialty medication(s) and dose(s) confirmed: Patient reports changes to the regimen as follows: Take 2 tablets (8 mg total) by mouth every morning. **new rx is on profile**   Changes to medications: Michele Cisneros reports no changes at this time.  Changes to insurance: No  Questions for the pharmacist: No    Confirmed patient received Welcome Packet with first shipment. The patient will receive a drug information handout for each medication shipped and additional FDA Medication Guides as required.       DISEASE/MEDICATION-SPECIFIC INFORMATION        N/A    SPECIALTY MEDICATION ADHERENCE     Medication Adherence    Patient reported X missed doses in the last month: 0  Specialty Medication: Envarsus 4mg   Patient is on additional specialty medications: No        Envarsus 4 mg: 7 days of medicine on hand     SHIPPING     Shipping address confirmed in Epic.     Delivery Scheduled: Yes, Expected medication delivery date: 01/02/2020.     Medication will be delivered via Same Day Courier to the prescription address in Epic WAM.    Michele Cisneros   [facility] Specialty Technician

## 2020-01-02 DIAGNOSIS — Z79899 Other long term (current) drug therapy: Principal | ICD-10-CM

## 2020-01-02 DIAGNOSIS — Z940 Kidney transplant status: Principal | ICD-10-CM

## 2020-01-02 MED ORDER — PREGABALIN 75 MG CAPSULE
ORAL_CAPSULE | ORAL | 3 refills | 30 days | Status: CP
Start: 2020-01-02 — End: ?
  Filled 2020-01-05: qty 90, 30d supply, fill #0

## 2020-01-02 MED FILL — MAGNESIUM OXIDE 400 MG (241.3 MG MAGNESIUM) TABLET: ORAL | 60 days supply | Qty: 120 | Fill #3

## 2020-01-02 MED FILL — CLONIDINE HCL 0.1 MG TABLET: 30 days supply | Qty: 60 | Fill #2 | Status: AC

## 2020-01-02 MED FILL — SODIUM BICARBONATE 650 MG TABLET: ORAL | 50 days supply | Qty: 100 | Fill #1

## 2020-01-02 MED FILL — MAGNESIUM OXIDE 400 MG (241.3 MG MAGNESIUM) TABLET: 60 days supply | Qty: 120 | Fill #3 | Status: AC

## 2020-01-02 MED FILL — SODIUM BICARBONATE 650 MG TABLET: 50 days supply | Qty: 100 | Fill #1 | Status: AC

## 2020-01-02 MED FILL — CLONIDINE HCL 0.1 MG TABLET: ORAL | 30 days supply | Qty: 60 | Fill #2

## 2020-01-02 MED FILL — ENVARSUS XR 4 MG TABLET,EXTENDED RELEASE: 30 days supply | Qty: 60 | Fill #0 | Status: AC

## 2020-01-02 MED FILL — METFORMIN ER 500 MG TABLET,EXTENDED RELEASE 24 HR: 30 days supply | Qty: 90 | Fill #5 | Status: AC

## 2020-01-02 MED FILL — METFORMIN ER 500 MG TABLET,EXTENDED RELEASE 24 HR: ORAL | 30 days supply | Qty: 90 | Fill #5

## 2020-01-03 NOTE — Unmapped (Signed)
Patient reports she is still having diarrhea. Three loose stools a day.  No new n/v. Has been eating a bland diet and drinking well. No weight loss    States she [facility] at [facility] Friday.    Asked TPA to follow up.    Patient to see her PMD for evaluation if she does not feel better.      Let her know I will follow up with stool sample and let her know

## 2020-01-04 ENCOUNTER — Telehealth (INDEPENDENT_AMBULATORY_CARE_PROVIDER_SITE_OTHER): Payer: Medicare Other | Admitting: Internal Medicine

## 2020-01-04 ENCOUNTER — Encounter: Payer: Self-pay | Admitting: Internal Medicine

## 2020-01-04 VITALS — BP 116/61 | Ht 67.5 in | Wt 148.8 lb

## 2020-01-04 DIAGNOSIS — R197 Diarrhea, unspecified: Secondary | ICD-10-CM | POA: Diagnosis not present

## 2020-01-04 NOTE — Progress Notes (Signed)
Onset of symptoms 9 days ago. Loose/liquid stools that are dark.   States it will start after she eats and is ongoing all day.

## 2020-01-04 NOTE — Progress Notes (Signed)
telephone Note  I connected with Theresa [name]   on 01/04/20 at 10:30 AM EDT by a telephone and verified that I am speaking with the correct person using two identifiers.  Location patient: home Location provider:work or home office Persons participating in the virtual visit: patient, provider, pts son  I discussed the limitations of evaluation and management by telemedicine and the availability of in person appointments. The patient expressed understanding and agreed to proceed.   HPI:  Acute visit  1. Diarrhea 3-4 x per day watery loose x 9 days ,no one else in house sick ,no fever, no ab pain, no new foods, not recently on Abx. On metformin 500 bid A1C 5.6 10/05/19 will stop. Stool is dark but she did try peptobismol w/o help and saw [facility] 12/30/19 turned in stool specimans to [facility] but [facility] unable to find/do not have them  She is still able to eat and hydrate   ROS: See pertinent positives and negatives per HPI.  Past Medical History:  Diagnosis Date  . Anemia of chronic disease   . Anginal pain ([place])   . Carotid arterial disease ([place])    a. 02/2013 U/S: 40-59% bilat ICA stenosis.  . Chronic constipation   . Chronic diastolic CHF (congestive heart failure) ([place])    a. 10/2013 Echo [facility]): EF 55-60%, mod conc LVH, mod MR, mildly dil LA, mild Ao sclerosis w/o stenosis; b. 02/2016 Echo: EF 55-60%, no rwma, Gr DD, mild AS, mod to sev MR, mildly dil LA, PASP 53mmHg; c. 07/2017 Echo [facility]): EF >55%, mild to mod LVH, Gr2 DD, Ao scl, Mod MR, mod dil LA, nl RV fxn.  . Colon polyps   . COPD (chronic obstructive pulmonary disease) ([place])   . Coronary artery disease    a. 05/2013 NSTEMI/PCI: LM 20d, LAD min irregs, LCX small, nl, OM1 nl, RCA dom 80m (2.5x16 Promus DES), PDA1 80p; b. 04/2015 Cath: LM nl, LAD 46m, D1/2 min irregs, LCX 35p/m, OM2/3 min irregs, RCA patent mid stent, RPDA 70ost, RPLB1 30, RPLB2/3 min irregs, EF 55-65%--> Med Rx; c. 06/2017 MV [facility]): No ischemia/infarct, EF  64%.  . Diabetes mellitus   . Diabetic neuropathy ([place])   . Diabetic retinopathy ([place]) 05/28/2013   Hx bilat retinal detachment, proliferative diab retinopathy and bilat vitreous hemorrhage   . Emphysema   . ESRD on hemodialysis ([place])    a. [facility] in [place], [place] ([phone]/Dr. [name], on a MWF schedule.  She started dialysis in Feb 2014.  Etiology of renal failure not known, likely diabetes.  Has a left upper arm AV graft.  . History of bronchitis    Mar 2012  . History of pneumonia    June 2012  . History of tobacco abuse    a. Quit 2012.  Marland Kitchen Hyperlipidemia   . Hypertension   . Moderate to severe mitral insufficiency    a. 10/2013 Echo: EF 55-60%, mod MR; b. 02/2016 Echo: EF 55-60%, mod to sev MR directed posteriorly--felt to be dynamic -worse with volume overload; c. 07/2017 Echo [facility]): EF >55%. Mod MR.  . Myocardial infarct ([place]) 05/2013  . Peripheral vascular disease ([place])   . Sickle cell trait ([place])   . Thyroid nodule    [place] 06/2017 due to f/u US in 1 year     Past Surgical History:  Procedure Laterality Date  . A/V FISTULAGRAM Left 11/10/2017   Procedure: A/V FISTULAGRAM;  Surgeon: Katha Cabal, MD;  Location: [place] [facility];  Service: Cardiovascular;  Laterality: Left;  . A/V SHUNTOGRAM Left 02/08/2019   Procedure: A/V SHUNTOGRAM;  Surgeon: Katha Cabal, MD;  Location: [place] [facility];  Service: Cardiovascular;  Laterality: Left;  . ABDOMINAL HYSTERECTOMY     2000  . CARDIAC CATHETERIZATION    . CARDIAC CATHETERIZATION N/A 05/02/2015   Procedure: Left Heart Cath and Coronary Angiography;  Surgeon: [name], MD;  Location: [place] [facility];  Service: Cardiovascular;  Laterality: N/A;  . COLONOSCOPY WITH PROPOFOL N/A 07/08/2016   Procedure: COLONOSCOPY WITH PROPOFOL;  Surgeon: [name], MD;  Location: [facility] ENDOSCOPY;  Service: Endoscopy;  Laterality: N/A;  . COLONOSCOPY WITH PROPOFOL N/A 08/20/2017   Procedure: COLONOSCOPY WITH PROPOFOL;   Surgeon: [name], MD;  Location: [facility] ENDOSCOPY;  Service: Gastroenterology;  Laterality: N/A;  . colonscopy    . CORONARY ANGIOPLASTY  05/28/2014   stent placement to the mid RCA  . [place] [facility] with stent placement 11 or 05/2019 and stent removal 06/13/19   . DILATION AND CURETTAGE OF UTERUS     several in the early 80's  . ESOPHAGOGASTRODUODENOSCOPY     2012  . ESOPHAGOGASTRODUODENOSCOPY (EGD) WITH PROPOFOL N/A 03/11/2018   Procedure: ESOPHAGOGASTRODUODENOSCOPY (EGD) WITH PROPOFOL;  Surgeon: [name], MD;  Location: [facility] ENDOSCOPY;  Service: Gastroenterology;  Laterality: N/A;  . EYE SURGERY     bilateral laser 2012  . EYE SURGERY     right  . EYE SURGERY     x4 both eyes  . GAS INSERTION  09/30/2011   Procedure: INSERTION OF GAS;  Surgeon: [name], MD;  Location: [place];  Service: Ophthalmology;  Laterality: Right;  [id number]  . GAS/FLUID EXCHANGE  09/30/2011   Procedure: GAS/FLUID EXCHANGE;  Surgeon: [name], MD;  Location: [place];  Service: Ophthalmology;  Laterality: Right;  . KIDNEY TRANSPLANT     03/30/19 Dr. Mickel Baas Thomas/Dr. Cristie Hem Zendel/Dr. Gilford Raid; [facility] transplant Dr. Horald Chestnut will be primary [phone] coordinator Sarah Wynkoop [phone]  . KIDNEY TRANSPLANT     03/30/19 [facility]  . LEFT HEART CATHETERIZATION WITH CORONARY ANGIOGRAM N/A 05/28/2013   Procedure: LEFT HEART CATHETERIZATION WITH CORONARY ANGIOGRAM;  Surgeon: [name], MD;  Location: [facility] CATH LAB;  Service: Cardiovascular;  Laterality: N/A;  . PARS PLANA VITRECTOMY  04/22/2011   Procedure: PARS PLANA VITRECTOMY WITH 25 GAUGE;  Surgeon: [name], MD;  Location: [place];  Service: Ophthalmology;  Laterality: Left;  membrane peel, endolaser, gas fluid exchange, silicone oil, repair of complex traction retinal detachment  . PARS PLANA VITRECTOMY  09/30/2011   Procedure: PARS PLANA VITRECTOMY WITH 25 GAUGE;  Surgeon: [name], MD;  Location: [place];  Service:  Ophthalmology;  Laterality: Right;  Endolaser; Repair of Complex Traction Retinal Detachment  . PARS PLANA VITRECTOMY  02/24/2012   Procedure: PARS PLANA VITRECTOMY WITH 25 GAUGE;  Surgeon: [name], MD;  Location: [place];  Service: Ophthalmology;  Laterality: Left;  . PTCA    . SILICON OIL REMOVAL  1/60/1093   Procedure: SILICON OIL REMOVAL;  Surgeon: [name], MD;  Location: [place];  Service: Ophthalmology;  Laterality: Left;  . THROMBECTOMY / ARTERIOVENOUS GRAFT REVISION    . TUBAL LIGATION     1979  . UPPER EXTREMITY ANGIOGRAPHY Left 02/08/2019   Procedure: UPPER EXTREMITY ANGIOGRAPHY;  Surgeon: Katha Cabal, MD;  Location: [place] [facility];  Service: Cardiovascular;  Laterality: Left;    Family History  Problem Relation Age of Onset  . Stroke Mother   . Heart attack Mother   . Heart disease Mother   . Colon cancer Father   . Colon cancer Sister   . Heart attack Brother   . Stroke Brother   . Diabetes Brother   . Breast cancer Sister   . Diabetes Brother   . Diabetes Daughter   . Renal Disease Daughter        on HD  . Anesthesia problems Neg Hx   . Hypotension Neg Hx   . Malignant hyperthermia Neg Hx   . Pseudochol deficiency Neg Hx     SOCIAL HX: lives home with family   Current Outpatient Medications:  .  albuterol (VENTOLIN HFA) 108 (90 Base) MCG/ACT inhaler, Inhale 1-2 puffs into the lungs every 6 (six) hours as needed for wheezing or shortness of breath., Disp: 1 Inhaler, Rfl: 12 .  amLODipine (NORVASC) 10 MG tablet, Take 10 mg by mouth daily. , Disp: , Rfl:  .  aspirin EC 81 MG tablet, Take 1 tablet (81 mg total) by mouth at bedtime., Disp: 30 tablet, Rfl: 11 .  atorvastatin (LIPITOR) 40 MG tablet, Take 40 mg by mouth daily at 6 PM., Disp: , Rfl:  .  carvedilol (COREG) 25 MG tablet, Take 1.5 tablets (37.5 mg total) by mouth 2 (two) times daily with a meal., Disp: , Rfl:  .  cetirizine (ZYRTEC) 5 MG tablet, Take 1 tablet (5 mg total) by mouth  daily as needed for allergies. (Patient taking differently: Take 5 mg by mouth daily. ), Disp: 90 tablet, Rfl: 3 .  magnesium oxide (MAG-OX) 400 MG tablet, Take by mouth., Disp: , Rfl:  .  metFORMIN (GLUCOPHAGE) 500 MG tablet, Take by mouth 2 (two) times daily with a meal., Disp: , Rfl:  .  mycophenolate (MYFORTIC) 360 MG TBEC EC tablet, Take 360 mg by mouth 2 (two) times daily., Disp: , Rfl:  .  nitroGLYCERIN (NITROSTAT) 0.4 MG SL tablet, Place 1 tablet (0.4 mg total) under the tongue every 5 (five) minutes as needed for chest pain., Disp: 25 tablet, Rfl: 3 .  pregabalin (LYRICA) 75 MG capsule, Take 1 capsule (75 mg total) by mouth 2 (two) times daily., Disp: , Rfl:  .  Tacrolimus ER (ENVARSUS XR) 1 MG TB24, Take 2 tablets by mouth every morning., Disp: , Rfl:  .  Tacrolimus ER 4 MG TB24, Take 2 tablets by mouth every morning. Total dose 14 mg daily in am, Disp: , Rfl:  .  UNABLE TO FIND, TEST BLOOD SUGARS 2 TIMES DAILY, Disp: , Rfl:  .  valGANciclovir (VALCYTE) 450 MG tablet, Take 450 mg by mouth daily., Disp: , Rfl:  .  albuterol (PROVENTIL) (2.5 MG/3ML) 0.083% nebulizer solution, Take 3 mLs (2.5 mg total) by nebulization every 6 (six) hours as needed for wheezing or shortness of breath. (Patient not taking: Reported on 01/04/2020), Disp: 75 mL, Rfl: 11 .  lidocaine (XYLOCAINE) 2 % solution, , Disp: , Rfl:  .  metoCLOPramide (REGLAN) 5 MG tablet, Take by mouth. (Patient not taking: Reported on 01/04/2020), Disp: , Rfl:   EXAM:  VITALS per patient if applicable:  GENERAL: alert, oriented, appears well and in no acute distress  PSYCH/NEURO: pleasant and cooperative, no obvious depression or anxiety, speech and thought processing grossly intact  ASSESSMENT AND PLAN:  Discussed the following assessment and plan:  Diarrhea, unspecified type - Plan: Clostridium Difficile  by PCR([facility]), GI pathogen panel by PCR, stool, Ova and parasite examination  BRAT diet  Hydration  Above tests    -we discussed possible serious and likely etiologies, options for evaluation and workup, limitations of telemedicine visit vs in person visit, treatment, treatment risks and precautions. Pt prefers to treat via telemedicine empirically rather then risking or undertaking an in person visit at this moment. Patient agrees to seek prompt in person care if worsening, new symptoms arise, or if is not improving with treatment.   I discussed the assessment and treatment plan with the patient. The patient was provided an opportunity to ask questions and all were answered. The patient agreed with the plan and demonstrated an understanding of the instructions.   The patient was advised to call back or seek an in-person evaluation if the symptoms worsen or if the condition fails to improve as anticipated.  Time spent 20 min Delorise Jackson, MD

## 2020-01-05 MED FILL — PREGABALIN 75 MG CAPSULE: 30 days supply | Qty: 90 | Fill #0 | Status: AC

## 2020-01-06 ENCOUNTER — Encounter: Payer: Self-pay | Admitting: *Deleted

## 2020-01-06 DIAGNOSIS — M79605 Pain in left leg: Secondary | ICD-10-CM | POA: Insufficient documentation

## 2020-01-06 DIAGNOSIS — R29898 Other symptoms and signs involving the musculoskeletal system: Secondary | ICD-10-CM | POA: Insufficient documentation

## 2020-01-06 DIAGNOSIS — M79604 Pain in right leg: Secondary | ICD-10-CM | POA: Insufficient documentation

## 2020-01-06 DIAGNOSIS — R202 Paresthesia of skin: Secondary | ICD-10-CM | POA: Insufficient documentation

## 2020-01-06 DIAGNOSIS — R200 Anesthesia of skin: Secondary | ICD-10-CM | POA: Insufficient documentation

## 2020-01-09 ENCOUNTER — Telehealth: Payer: Self-pay | Admitting: Internal Medicine

## 2020-01-09 DIAGNOSIS — Z79899 Other long term (current) drug therapy: Principal | ICD-10-CM

## 2020-01-09 DIAGNOSIS — Z940 Kidney transplant status: Principal | ICD-10-CM

## 2020-01-09 NOTE — Telephone Encounter (Signed)
Lm on vm to call and make a 2 to 4 week follow appt

## 2020-01-10 LAB — SPECIMEN STATUS REPORT

## 2020-01-12 ENCOUNTER — Encounter: Payer: Self-pay | Admitting: Podiatry

## 2020-01-12 ENCOUNTER — Other Ambulatory Visit: Payer: Self-pay

## 2020-01-12 ENCOUNTER — Ambulatory Visit (INDEPENDENT_AMBULATORY_CARE_PROVIDER_SITE_OTHER): Payer: Medicare Other | Admitting: Podiatry

## 2020-01-12 DIAGNOSIS — M79675 Pain in left toe(s): Secondary | ICD-10-CM

## 2020-01-12 DIAGNOSIS — B351 Tinea unguium: Secondary | ICD-10-CM | POA: Diagnosis not present

## 2020-01-12 DIAGNOSIS — M79674 Pain in right toe(s): Secondary | ICD-10-CM

## 2020-01-12 DIAGNOSIS — E1142 Type 2 diabetes mellitus with diabetic polyneuropathy: Secondary | ICD-10-CM

## 2020-01-12 DIAGNOSIS — L608 Other nail disorders: Secondary | ICD-10-CM | POA: Diagnosis not present

## 2020-01-12 DIAGNOSIS — I739 Peripheral vascular disease, unspecified: Secondary | ICD-10-CM

## 2020-01-12 MED FILL — CARVEDILOL 12.5 MG TABLET: ORAL | 30 days supply | Qty: 180 | Fill #8

## 2020-01-12 MED FILL — AMLODIPINE 10 MG TABLET: ORAL | 30 days supply | Qty: 30 | Fill #8

## 2020-01-12 MED FILL — AMLODIPINE 10 MG TABLET: 30 days supply | Qty: 30 | Fill #8 | Status: AC

## 2020-01-12 MED FILL — CARVEDILOL 12.5 MG TABLET: 30 days supply | Qty: 180 | Fill #8 | Status: AC

## 2020-01-12 NOTE — Progress Notes (Signed)
This patient returns to my office for at risk foot care.  This patient requires this care by a professional since this patient will be at risk due to having kidney transplant, PAD, and diabetes.  This patient is unable to cut nails herself since the patient cannot reach her nails.These nails are painful walking and wearing shoes.  This patient presents for at risk foot care today.  General Appearance  Alert, conversant and in no acute stress.  Vascular  Dorsalis pedis and posterior tibial  pulses are palpable  bilaterally.  Capillary return is within normal limits  bilaterally. Temperature is within normal limits  bilaterally.  Neurologic  Senn-Weinstein monofilament wire test within normal limits  bilaterally. Muscle power within normal limits bilaterally.  Nails Thick disfigured discolored nails with subungual debris  from hallux to fifth toes bilaterally. No evidence of bacterial infection or drainage bilaterally.  Orthopedic  No limitations of motion  feet .  No crepitus or effusions noted.  No bony pathology or digital deformities noted.  HAV  B/L.  Skin  normotropic skin with no porokeratosis noted bilaterally.  No signs of infections or ulcers noted.     Onychomycosis  Pain in right toes  Pain in left toes  Consent was obtained for treatment procedures.   Mechanical debridement of nails 1-5  bilaterally performed with a nail nipper.  Filed with dremel without incident.    Return office visit   3 months.                  Told patient to return for periodic foot care and evaluation due to potential at risk complications.   Gardiner Barefoot DPM

## 2020-01-16 DIAGNOSIS — Z940 Kidney transplant status: Principal | ICD-10-CM

## 2020-01-16 DIAGNOSIS — Z79899 Other long term (current) drug therapy: Principal | ICD-10-CM

## 2020-01-23 DIAGNOSIS — Z79899 Other long term (current) drug therapy: Principal | ICD-10-CM

## 2020-01-23 DIAGNOSIS — Z940 Kidney transplant status: Principal | ICD-10-CM

## 2020-01-23 DIAGNOSIS — G959 Disease of spinal cord, unspecified: Secondary | ICD-10-CM | POA: Insufficient documentation

## 2020-01-24 DIAGNOSIS — Z940 Kidney transplant status: Secondary | ICD-10-CM | POA: Diagnosis not present

## 2020-01-24 DIAGNOSIS — Z79899 Other long term (current) drug therapy: Secondary | ICD-10-CM | POA: Diagnosis not present

## 2020-01-25 NOTE — Unmapped (Signed)
[facility] Refill Coordination Note    Specialty Medication(s) to be Shipped:   Transplant: Envarsus 4mg     Other medication(s) to be shipped: pregabalin 75mg , clonidine 0.1mg  and metformin 500mg      Michele Cisneros, DOB: 11-Nov-1955  Phone: [phone] (home)       All above HIPAA information was verified with patient.     Was a Nurse, learning disability used for this call? No    Completed refill call assessment today to schedule patient's medication shipment from the [facility] [phone]).       Specialty medication(s) and dose(s) confirmed: Regimen is correct and unchanged.   Changes to medications: [name] reports no changes at this time.  Changes to insurance: No  Questions for the pharmacist: No    Confirmed patient received Welcome Packet with first shipment. The patient will receive a drug information handout for each medication shipped and additional FDA Medication Guides as required.       DISEASE/MEDICATION-SPECIFIC INFORMATION        N/A    SPECIALTY MEDICATION ADHERENCE     Medication Adherence    Patient reported X missed doses in the last month: 0  Specialty Medication: Envarsus 4mg   Patient is on additional specialty medications: No        Envarsus 4 mg: 7 days of medicine on hand     SHIPPING     Shipping address confirmed in Epic.     Delivery Scheduled: Yes, Expected medication delivery date: 01/31/2020.     Medication will be delivered via Next Day Courier to the prescription address in Epic WAM.    Michele Cisneros   [facility] Specialty Technician

## 2020-01-27 DIAGNOSIS — N186 End stage renal disease: Principal | ICD-10-CM

## 2020-01-27 DIAGNOSIS — Z940 Kidney transplant status: Principal | ICD-10-CM

## 2020-01-30 DIAGNOSIS — Z940 Kidney transplant status: Principal | ICD-10-CM

## 2020-01-30 DIAGNOSIS — Z79899 Other long term (current) drug therapy: Principal | ICD-10-CM

## 2020-01-30 MED FILL — METFORMIN ER 500 MG TABLET,EXTENDED RELEASE 24 HR: ORAL | 30 days supply | Qty: 90 | Fill #6

## 2020-01-30 MED FILL — CLONIDINE HCL 0.1 MG TABLET: 30 days supply | Qty: 60 | Fill #3 | Status: AC

## 2020-01-30 MED FILL — ENVARSUS XR 4 MG TABLET,EXTENDED RELEASE: 30 days supply | Qty: 60 | Fill #1 | Status: AC

## 2020-01-30 MED FILL — PREGABALIN 75 MG CAPSULE: 30 days supply | Qty: 90 | Fill #1 | Status: AC

## 2020-01-30 MED FILL — ENVARSUS XR 4 MG TABLET,EXTENDED RELEASE: ORAL | 30 days supply | Qty: 60 | Fill #1

## 2020-01-30 MED FILL — METFORMIN ER 500 MG TABLET,EXTENDED RELEASE 24 HR: 30 days supply | Qty: 90 | Fill #6 | Status: AC

## 2020-01-30 MED FILL — CLONIDINE HCL 0.1 MG TABLET: ORAL | 30 days supply | Qty: 60 | Fill #3

## 2020-01-30 MED FILL — PREGABALIN 75 MG CAPSULE: ORAL | 30 days supply | Qty: 90 | Fill #1

## 2020-02-01 ENCOUNTER — Ambulatory Visit: Admit: 2020-02-01 | Discharge: 2020-02-02 | Payer: MEDICARE | Attending: Nephrology | Primary: Nephrology

## 2020-02-01 ENCOUNTER — Ambulatory Visit: Admit: 2020-02-01 | Discharge: 2020-02-01 | Payer: MEDICARE

## 2020-02-01 ENCOUNTER — Ambulatory Visit: Admit: 2020-02-01 | Discharge: 2020-02-02 | Payer: MEDICARE

## 2020-02-01 ENCOUNTER — Encounter: Admit: 2020-02-01 | Discharge: 2020-02-02 | Payer: MEDICARE

## 2020-02-01 DIAGNOSIS — N3000 Acute cystitis without hematuria: Principal | ICD-10-CM

## 2020-02-01 DIAGNOSIS — K047 Periapical abscess without sinus: Principal | ICD-10-CM

## 2020-02-01 DIAGNOSIS — Z940 Kidney transplant status: Principal | ICD-10-CM

## 2020-02-01 DIAGNOSIS — I252 Old myocardial infarction: Secondary | ICD-10-CM | POA: Diagnosis not present

## 2020-02-01 DIAGNOSIS — D849 Immunodeficiency, unspecified: Secondary | ICD-10-CM | POA: Diagnosis not present

## 2020-02-01 DIAGNOSIS — E782 Mixed hyperlipidemia: Secondary | ICD-10-CM | POA: Diagnosis not present

## 2020-02-01 DIAGNOSIS — N170 Acute kidney failure with tubular necrosis: Secondary | ICD-10-CM | POA: Diagnosis not present

## 2020-02-01 DIAGNOSIS — M5416 Radiculopathy, lumbar region: Secondary | ICD-10-CM | POA: Diagnosis not present

## 2020-02-01 DIAGNOSIS — R911 Solitary pulmonary nodule: Secondary | ICD-10-CM | POA: Diagnosis not present

## 2020-02-01 DIAGNOSIS — I2510 Atherosclerotic heart disease of native coronary artery without angina pectoris: Secondary | ICD-10-CM | POA: Diagnosis not present

## 2020-02-01 DIAGNOSIS — E1121 Type 2 diabetes mellitus with diabetic nephropathy: Secondary | ICD-10-CM | POA: Diagnosis not present

## 2020-02-01 DIAGNOSIS — Z66 Do not resuscitate: Secondary | ICD-10-CM | POA: Diagnosis not present

## 2020-02-01 DIAGNOSIS — I951 Orthostatic hypotension: Secondary | ICD-10-CM | POA: Diagnosis not present

## 2020-02-01 DIAGNOSIS — G4733 Obstructive sleep apnea (adult) (pediatric): Secondary | ICD-10-CM | POA: Diagnosis not present

## 2020-02-01 DIAGNOSIS — I5032 Chronic diastolic (congestive) heart failure: Secondary | ICD-10-CM | POA: Diagnosis not present

## 2020-02-01 DIAGNOSIS — B964 Proteus (mirabilis) (morganii) as the cause of diseases classified elsewhere: Secondary | ICD-10-CM | POA: Diagnosis not present

## 2020-02-01 DIAGNOSIS — I110 Hypertensive heart disease with heart failure: Secondary | ICD-10-CM | POA: Diagnosis not present

## 2020-02-01 DIAGNOSIS — R251 Tremor, unspecified: Secondary | ICD-10-CM | POA: Diagnosis not present

## 2020-02-01 DIAGNOSIS — E1151 Type 2 diabetes mellitus with diabetic peripheral angiopathy without gangrene: Secondary | ICD-10-CM | POA: Diagnosis not present

## 2020-02-01 DIAGNOSIS — E1142 Type 2 diabetes mellitus with diabetic polyneuropathy: Secondary | ICD-10-CM | POA: Diagnosis not present

## 2020-02-01 DIAGNOSIS — N2889 Other specified disorders of kidney and ureter: Secondary | ICD-10-CM | POA: Diagnosis not present

## 2020-02-01 DIAGNOSIS — Z8639 Personal history of other endocrine, nutritional and metabolic disease: Secondary | ICD-10-CM | POA: Diagnosis not present

## 2020-02-01 DIAGNOSIS — J449 Chronic obstructive pulmonary disease, unspecified: Secondary | ICD-10-CM | POA: Diagnosis not present

## 2020-02-01 DIAGNOSIS — Z6822 Body mass index (BMI) 22.0-22.9, adult: Secondary | ICD-10-CM | POA: Diagnosis not present

## 2020-02-01 DIAGNOSIS — K222 Esophageal obstruction: Secondary | ICD-10-CM | POA: Diagnosis not present

## 2020-02-01 DIAGNOSIS — I25118 Atherosclerotic heart disease of native coronary artery with other forms of angina pectoris: Secondary | ICD-10-CM | POA: Diagnosis not present

## 2020-02-01 DIAGNOSIS — E041 Nontoxic single thyroid nodule: Secondary | ICD-10-CM | POA: Diagnosis not present

## 2020-02-01 DIAGNOSIS — E11638 Type 2 diabetes mellitus with other oral complications: Secondary | ICD-10-CM | POA: Diagnosis not present

## 2020-02-01 DIAGNOSIS — E11649 Type 2 diabetes mellitus with hypoglycemia without coma: Secondary | ICD-10-CM | POA: Diagnosis not present

## 2020-02-01 LAB — CBC W/ AUTO DIFF
BASOPHILS ABSOLUTE COUNT: 0 10*9/L (ref 0.0–0.1)
BASOPHILS RELATIVE PERCENT: 0.6 %
EOSINOPHILS ABSOLUTE COUNT: 0.1 10*9/L (ref 0.0–0.7)
EOSINOPHILS RELATIVE PERCENT: 1.4 %
HEMATOCRIT: 36.1 % (ref 35.0–44.0)
HEMOGLOBIN: 12 g/dL (ref 12.0–15.5)
LYMPHOCYTES ABSOLUTE COUNT: 0.7 10*9/L (ref 0.7–4.0)
LYMPHOCYTES RELATIVE PERCENT: 8.7 %
MEAN CORPUSCULAR HEMOGLOBIN CONC: 33.4 g/dL (ref 30.0–36.0)
MEAN CORPUSCULAR HEMOGLOBIN: 31.9 pg (ref 26.0–34.0)
MEAN CORPUSCULAR VOLUME: 95.7 fL (ref 82.0–98.0)
MEAN PLATELET VOLUME: 9.9 fL (ref 7.0–10.0)
MONOCYTES ABSOLUTE COUNT: 0.4 10*9/L (ref 0.1–1.0)
MONOCYTES RELATIVE PERCENT: 5.6 %
NEUTROPHILS ABSOLUTE COUNT: 6.3 10*9/L (ref 1.7–7.7)
NEUTROPHILS RELATIVE PERCENT: 83.7 %
NUCLEATED RED BLOOD CELLS: 0 /100{WBCs} (ref <=4)
PLATELET COUNT: 154 10*9/L (ref 150–450)
RED BLOOD CELL COUNT: 3.77 10*12/L — ABNORMAL LOW (ref 3.90–5.03)
RED CELL DISTRIBUTION WIDTH: 15.4 % — ABNORMAL HIGH (ref 12.0–15.0)
WBC ADJUSTED: 7.5 10*9/L (ref 3.5–10.5)

## 2020-02-01 LAB — RENAL FUNCTION PANEL
ALBUMIN: 3.8 g/dL (ref 3.4–5.0)
ANION GAP: 4 mmol/L — ABNORMAL LOW (ref 5–14)
BLOOD UREA NITROGEN: 15 mg/dL (ref 9–23)
BUN / CREAT RATIO: 13
CALCIUM: 11.1 mg/dL — ABNORMAL HIGH (ref 8.7–10.4)
CHLORIDE: 110 mmol/L — ABNORMAL HIGH (ref 98–107)
CO2: 22.4 mmol/L (ref 20.0–31.0)
CREATININE: 1.15 mg/dL — ABNORMAL HIGH
EGFR CKD-EPI AA FEMALE: 59 mL/min/{1.73_m2} — ABNORMAL LOW (ref >=60)
EGFR CKD-EPI NON-AA FEMALE: 51 mL/min/{1.73_m2} — ABNORMAL LOW (ref >=60)
GLUCOSE RANDOM: 106 mg/dL (ref 70–179)
PHOSPHORUS: 4 mg/dL (ref 2.4–5.1)
POTASSIUM: 5.1 mmol/L — ABNORMAL HIGH (ref 3.4–4.5)
SODIUM: 136 mmol/L (ref 135–145)

## 2020-02-01 LAB — TACROLIMUS LEVEL, TROUGH: TACROLIMUS, TROUGH: 6.8 ng/mL (ref 5.0–15.0)

## 2020-02-01 LAB — MAGNESIUM: MAGNESIUM: 1.6 mg/dL (ref 1.6–2.6)

## 2020-02-01 MED ORDER — OXYCODONE-ACETAMINOPHEN 5 MG-325 MG TABLET
ORAL_TABLET | ORAL | 0 refills | 2.00000 days | Status: CP | PRN
Start: 2020-02-01 — End: ?

## 2020-02-01 MED ORDER — AMOXICILLIN 875 MG-POTASSIUM CLAVULANATE 125 MG TABLET
ORAL_TABLET | Freq: Two times a day (BID) | ORAL | 0 refills | 14.00000 days | Status: CP
Start: 2020-02-01 — End: 2020-02-15

## 2020-02-01 NOTE — Unmapped (Signed)
Transplant Nephrology Clinic Visit    Assessment/Plan:  Michele Cisneros is a 64 y.o. female s/p deceased donor kidney transplant on 03/30/2019 for native kidney ESRD presumed secondary to diabetic nephropathy. Active medical issues include:      S/P deceased donor kidney transplant with no history of rejection or DSAs and stable renal function.   - Serum creatinine 1.15 mg/dL (baseline range 1.6-1.0 mg/dL).     - DSA screens negative (most recently 10/05/19)   - UP/C has been normal  - BK viral load negative 10/05/19.      Immunosuppression Management   - Tacrolimus trough level 6.8 (target 6-10).    - Continue Envarsus XR 8 mg daily.   - Myfortic was on hold starting 09/12/19 due to leukopenia and is now on hold due to diarrhea.     Leukopenia, resolved, likely medication related  - Myfortic and Bactrim were stopped 09/12/19 with improvement in WBC now to the normal range.      CMV viremia  - Viral load pending today (most recently 91 on 10/05/19).   - On no therapy currently.    Hypertension, with postural hypotension and syncope   - BP remains 90-110 systolic at home despite tapering off clonidine.   - Will stop amlodipine today and reduce carvedilol from 37.5 mg bid to 25 mg bid.    Coronary artery disease, s/p RCA stent 2015, history of HFpEF secondary to diastolic dysfunction/mitral regurgitation (moderate to severe)  - no symptoms of cardiac ischemia or CHF presently   - continue atorvastatin and aspirin and carvedilol    Pulmonary Nodule with History of Smoking Associated COPD:   - Subcentimeter nodule seen on NM Stress 07/07/17 and CT chest 05/02/19 stable from previous studies 03/18/16 and 03/07/14  - Needs periodic low dose CT to screen for lung CA (next due November 21).     Lumbar Radiculopathy Secondary to L5-S1 disc disease and Peripheral Neuropathy secondary to DM   - Following with [facility]  - Continue Lyrica.      Thyroid Nodule:   - [place] today reveals no nodules suggestive of cancer    DM, type II:  - Metformin will be reduced from 1000 mg AM/500 mg PM to 500mg  BID due to hypoglycemia and diarrhea.   - Hgb A1C 5.6 on 10/05/19    Esophageal stenosis, history of esophageal ulcers:   - No current symptoms   - S/P dilation 07/06/19 with resolution of ulcers compared to 05/04/19 EGD    History of post-transplant UTI:   - Proteus isolated <100000 on 11/28/19 and 12/26/19  - Will treat with Augmentin today for tooth infection which should also cover the asymptomatic UTI    Tooth Infection:  - referral to [facility] dental school  - Augmentin started today  - Percocet #10 given for pain control.     Health Maintenance:   Mammo: 03/04/18; f/u 1 year now due  Pap Smear: s/p hysterectomy  Colonoscopy: 08/20/2017; repeat 5 years due to multiple tubular adenomas  Lung cancer screening: due for low dose CT chest in 04/2020    Immunizations:   Flu: 03/18/2019  Prevnar 13: not noted in records from 2012 and after.  Will get after 1 year post transplant  Pneumovax: 03/23/18  COVID-19: Patient is hesitant about taking COVID vaccine but is now more open to doing so.    Follow-Up: Return to clinic in 2 months      Interval History:   She presents today with several  complaints. She has facial swelling and tooth pain and cannot afford to go to the dentist. She denies fever or chills. Blood pressure is approximately 100 systolic recently and this is unchanged. She has postural dizziness preceding syncopal episodes approximately 2 months ago. She is now more careful upon standing. She remains on amlodipine 10 mg daily and carvedilol 37.5 mg bid. She has stopped clonidine. She has also had symptomatic hypoglycemic episodes and intermittent diarrhea. Myfortic has been on hold for leukopenia. She is currently taking metformin 1000 mg AM and 500 mg PM. She continues to have radiculopathy associated back and leg pain and states she has a recent NCV/EMG study.     Proteus mirabilis was isolated from her urine 11/28/19 and 12/26/19. This was not treated since the isolate was <100,000 colonies on both occasions and she was asymptomatic. She still denies dysuria or urinary frequency. Hand tremors are somewhat improved.  CMV viral load has been mildly positive.  She denies chest pain, edema, or shortness of breath.     Transplant History:    Native Kidney Disease: Diabetic nephropathy   Pre-transplant PRA: 70%   Organ Received: DDKT, DBD, KDPI: 70%  CMV/EBV Status: CMV D-/R+  EBV D+/R+  Resp culture: + strep pneumo  Date of Transplant: 03/30/2019  Implantation biopsy: mild ATN, otherwise normal.  Post-transplant complication - DGF  Induction: Campath with rapid steroid discontinuation  Maintenance Immunosuppression: Tacrolimus/Myfotic  Date of Ureteral Stent Removal: 06/13/2019  Current Immunosuppression: Tacrolimus/Myfortic  Results of Renal Imaging (pre and post):      Pre-Txp 07/07/17  -Kidneys are small with mildly increased echogenicity bilaterally. No solid masses or calculi in the visualized kidneys. No hydronephrosis    CT Abdomen/Pelvis w/o Contrast 03/29/2019 (pre-txp)  -1.2 cm hypodensity in the right kidney lower pole (2:48), increased in size from prior however incompletely characterized given lack of intravenous contrast.  -Uterus is surgically absent.    Post-Txp 05/08/2019 (txp kidney only)  --The renal transplant was located in the left lower quadrant. Normal size and echogenicity.  No solid masses or calculi. No perinephric collections identified. Double-J ureteral stent with proximal tip within the renal pelvis and distal tip within the bladder. Mild caliectasis. No hydronephrosis.  --Interval increase in resistive indices within the arcuate, segmental and main renal arteries measuring up to 0.98. Attention on follow-up.  --Improved perfusion in the upper pole of the transplant kidney, now with normal perfusion throughout the renal parenchyma.    Current Immunosuppression Regimen:   Envarsus 12 mg daily  Myfortic on hold since 09/12/19    Other Past Medical History  1. Diabetes mellitus, type 2, diagnosed 2012.   2. Diabetic peripheral neuropathy  3. Diabetic retinopathy, s/p laser surgery, s/p retinal detachment  4. Hypertension, diagnosed 2012.   5. Mixed hyperlipidemia.   6. Coronary artery disease, s/p NSTEMI 05/2013, PCI RCA, 20% KNm 80% PDA 1. Cardiac cath 05/02/15 LAD 20% and 30% lesions, Cx 35%, RPDA 70%, patent right coronary stent  7. HFpEF with EF 65-70% on echo 03/29/19 with  moderate to severe MR  8. COPD, emphysema secondary to smoking  9. Peripheral artery disease  10. Carotid artery stenosis 1-39% bilateral stenosis 12/2017  10. Sickle Cell Trait  11. S/P TAH/BSO 2000  12. Lumbar radiculopathy secondary to L5-S1 disc bulge   13. History of thyroid nodule  14. Colonic polyps removed 08/10/17, pathology tubular adenomas,  follow up in 3 years  15. Right breast soft tissue mass noted 07/07/17 stable in appearance  compared to past imaging  16. Pulmonary nodule noted 07/07/17 similar in size to lesion as far back as 2015.   17. OSA on CPAP    Review of Systems    Otherwise as per HPI, all other systems reviewed and are negative.    Medications  Current Outpatient Medications   Medication Sig Dispense Refill   ??? acetaminophen (TYLENOL) 500 MG tablet Take 1-2 tablets (500-1,000 mg total) by mouth every six (6) hours as needed for pain or fever (> 38C or 100.89F). 30 tablet 0   ??? albuterol (PROVENTIL HFA;VENTOLIN HFA) 90 mcg/actuation inhaler Inhale 2 puffs every six (6) hours as needed for wheezing.     ??? amLODIPine (NORVASC) 10 MG tablet Take 1 tablet (10 mg total) by mouth daily. 30 tablet 11   ??? aspirin (ECOTRIN) 81 MG tablet Take 81 mg by mouth daily.     ??? atorvastatin (LIPITOR) 40 MG tablet Take 1 tablet (40 mg total) by mouth daily. 90 tablet 11   ??? blood sugar diagnostic (CONTOUR TEST STRIPS) Strp by Other route two (2) times a day. TEST BLOOD SUGARS 2 TIMES DAILY 60 strip 5   ??? blood-glucose meter (ONETOUCH ULTRA SYSTEM KIT) kit Please dispense for patient to use at home to check blood sugars.     ??? carvediloL (COREG) 12.5 MG tablet Take 3 tablets (37.5 mg total) by mouth Two (2) times a day. 180 tablet 11   ??? cloNIDine HCL (CATAPRES) 0.1 MG tablet Take 1 tablet (0.1 mg total) by mouth Two (2) times a day. 60 tablet 11   ??? ergocalciferol (VITAMIN D2) 1,250 mcg (50,000 unit) capsule Take 1 capsule (50,000 Units total) by mouth once a week. Take for 8 weeks 4 capsule 2   ??? LANCETS,THIN MISC Using at home to test blood sugars twice daily.     ??? magnesium oxide (MAG-OX) 400 mg (241.3 mg elemental magnesium) tablet Take 1 tablet (400 mg total) by mouth Two (2) times a day. 120 tablet 11   ??? melatonin 3 mg Tab Take 2 tablets (6 mg total) by mouth nightly as needed.  0   ??? metFORMIN (GLUCOPHAGE XR) 500 MG 24 hr tablet Take 2 tablets (1,000 mg total) by mouth every morning AND 1 tablet (500 mg total) nightly. 90 tablet 11   ??? metoclopramide (REGLAN) 5 MG tablet Take 1 tablet (5 mg total) by mouth Three (3) times a day as needed (nausea). 90 tablet 1   ??? mycophenolate (MYFORTIC) 180 MG EC tablet HOLD 120 tablet 11   ??? nitroglycerin (NITROSTAT) 0.4 MG SL tablet Place 0.4 mg under the tongue daily as needed.     ??? pregabalin (LYRICA) 75 MG capsule Take 1 capsule (75 mg total) by mouth every morning AND 2 capsules (150 mg total) nightly. 90 capsule 3   ??? sodium bicarbonate 650 mg tablet Take 1 tablet (650 mg total) by mouth Two (2) times a day. 100 tablet 1   ??? tacrolimus (ENVARSUS XR) 4 mg Tb24 extended release tablet Take 2 tablets (8 mg total) by mouth every morning. 60 tablet 11     No current facility-administered medications for this visit.           Physical Exam  BP 110/46 (BP Site: R Arm, BP Position: Sitting, BP Cuff Size: Medium)  - Pulse 62  - Temp 36.3 ??C (97.3 ??F) (Temporal)  - Wt 67.1 kg (148 lb)  - BMI 22.84 kg/m??   General: no acute  distress  HEENT: anicteric, left facial swelling  Neck: neck supple, no cervical lymphadenopathy appreciated  CV: grade 2/6 systolic murmur  Lungs: clear to auscultation bilaterally  Abdomen: soft, tender with palpation over graft site   Extremities: no edema  Neurologic: uses cane for walking, bilateral LE weakness       Laboratory Data  Recent Results (from the past 170 hour(s))   Renal Function Panel    Collection Time: 02/01/20  7:44 AM   Result Value Ref Range    Sodium 136 135 - 145 mmol/L    Potassium 5.1 (H) 3.4 - 4.5 mmol/L    Chloride 110 (H) 98 - 107 mmol/L    CO2 22.4 20.0 - 31.0 mmol/L    Anion Gap 4 (L) 5 - 14 mmol/L    BUN 15 9 - 23 mg/dL    Creatinine 1.61 (H) 0.60 - 0.80 mg/dL    BUN/Creatinine Ratio 13     EGFR CKD-EPI Non-African American, Female 51 (L) >=60 mL/min/1.15m2    EGFR CKD-EPI African American, Female 59 (L) >=60 mL/min/1.3m2    Glucose 106 70 - 179 mg/dL    Calcium 09.6 (H) 8.7 - 10.4 mg/dL    Phosphorus 4.0 2.4 - 5.1 mg/dL    Albumin 3.8 3.4 - 5.0 g/dL   Magnesium Level    Collection Time: 02/01/20  7:44 AM   Result Value Ref Range    Magnesium 1.6 1.6 - 2.6 mg/dL   Tacrolimus, Trough   [facility])    Collection Time: 02/01/20  7:44 AM   Result Value Ref Range    Tacrolimus, Trough 6.8 5.0 - 15.0 ng/mL   CBC w/ Differential    Collection Time: 02/01/20  7:44 AM   Result Value Ref Range    WBC 7.5 3.5 - 10.5 10*9/L    RBC 3.77 (L) 3.90 - 5.03 10*12/L    HGB 12.0 12.0 - 15.5 g/dL    HCT 04.5 40.9 - 81.1 %    MCV 95.7 82.0 - 98.0 fL    MCH 31.9 26.0 - 34.0 pg    MCHC 33.4 30.0 - 36.0 g/dL    RDW 91.4 (H) 78.2 - 15.0 %    MPV 9.9 7.0 - 10.0 fL    Platelet 154 150 - 450 10*9/L    nRBC 0 <=4 /100 WBCs    Neutrophils % 83.7 %    Lymphocytes % 8.7 %    Monocytes % 5.6 %    Eosinophils % 1.4 %    Basophils % 0.6 %    Absolute Neutrophils 6.3 1.7 - 7.7 10*9/L    Absolute Lymphocytes 0.7 0.7 - 4.0 10*9/L    Absolute Monocytes 0.4 0.1 - 1.0 10*9/L    Absolute Eosinophils 0.1 0.0 - 0.7 10*9/L    Absolute Basophils 0.0 0.0 - 0.1 10*9/L

## 2020-02-01 NOTE — Unmapped (Signed)
Saw patient in clinic with sister.  Today left side of face is swollen, reports rotten left molar.    Reports she has fainted a few times in the past month. Happens with getting up and bending over. BP 97-120/57-60's. Few falls, using cane    Reports low BS below 100 (2-3 week) which she feels bad.    +diarrhea every 3-4 days.. when she has diarrhea it is 4-5 times a day. Sometimes n/v with diarrhea    Reports she is still having lower back pain with possible surgery.    Denies CP, heart arrhythmias, SOB, abdominal pain,  Urinary problems, swelling, fevers    Tremors better but still having them    Drinking 4 bottles of water a day     Labs today, 9am

## 2020-02-02 LAB — VITAMIN D 25 HYDROXY: VITAMIN D, TOTAL (25OH): 17.4 ng/mL — ABNORMAL LOW (ref 20.0–80.0)

## 2020-02-03 LAB — CMV DNA, QUANTITATIVE, PCR: CMV VIRAL LD: NOT DETECTED

## 2020-02-06 DIAGNOSIS — Z79899 Other long term (current) drug therapy: Principal | ICD-10-CM

## 2020-02-06 DIAGNOSIS — Z940 Kidney transplant status: Principal | ICD-10-CM

## 2020-02-09 LAB — HLA DS POST TRANSPLANT
ANTI-DONOR DRW #2 MFI: 0 MFI
ANTI-DONOR HLA-A #1 MFI: 31 MFI
ANTI-DONOR HLA-A #2 MFI: 30 MFI
ANTI-DONOR HLA-B #1 MFI: 90 MFI
ANTI-DONOR HLA-B #2 MFI: 69 MFI
ANTI-DONOR HLA-C #1 MFI: 30 MFI
ANTI-DONOR HLA-C #2 MFI: 20 MFI
ANTI-DONOR HLA-DP AG #1 MFI: 14 MFI
ANTI-DONOR HLA-DQB #2 MFI: 0 MFI
ANTI-DONOR HLA-DR #1 MFI: 10 MFI

## 2020-02-09 LAB — HLA CLASS 1 ANTIBODY RESULT: Lab: POSITIVE

## 2020-02-09 LAB — FSAB CLASS 1 ANTIBODY SPECIFICITY

## 2020-02-09 LAB — DONOR HLA-C ANTIGEN #2

## 2020-02-09 LAB — HLA CL2 AB COMMENT: Lab: 0

## 2020-02-13 DIAGNOSIS — Z79899 Other long term (current) drug therapy: Principal | ICD-10-CM

## 2020-02-13 DIAGNOSIS — Z940 Kidney transplant status: Principal | ICD-10-CM

## 2020-02-14 DIAGNOSIS — Z79899 Other long term (current) drug therapy: Secondary | ICD-10-CM | POA: Diagnosis not present

## 2020-02-14 DIAGNOSIS — Z940 Kidney transplant status: Secondary | ICD-10-CM | POA: Diagnosis not present

## 2020-02-15 MED ORDER — CARVEDILOL 12.5 MG TABLET
ORAL_TABLET | Freq: Two times a day (BID) | ORAL | 11 refills | 30 days | Status: CP
Start: 2020-02-15 — End: 2021-02-14
  Filled 2020-02-16: qty 120, 30d supply, fill #0

## 2020-02-15 MED ORDER — METFORMIN ER 500 MG TABLET,EXTENDED RELEASE 24 HR
ORAL_TABLET | Freq: Two times a day (BID) | ORAL | 11 refills | 30 days | Status: CP
Start: 2020-02-15 — End: 2021-02-14
  Filled 2020-03-01: qty 60, 30d supply, fill #0

## 2020-02-15 NOTE — Unmapped (Signed)
[facility] Clinical Assessment & Refill Coordination Note    Michele Cisneros, DOB: 02-14-1956  Phone: [phone] (home)     All above HIPAA information was verified with patient.     Was a Nurse, learning disability used for this call? No    Specialty Medication(s):   Transplant: Envarsus 4mg      Current Outpatient Medications   Medication Sig Dispense Refill   ??? acetaminophen (TYLENOL) 500 MG tablet Take 1-2 tablets (500-1,000 mg total) by mouth every six (6) hours as needed for pain or fever (> 38C or 100.12F). 30 tablet 0   ??? albuterol (PROVENTIL HFA;VENTOLIN HFA) 90 mcg/actuation inhaler Inhale 2 puffs every six (6) hours as needed for wheezing.     ??? amLODIPine (NORVASC) 10 MG tablet Take 1 tablet (10 mg total) by mouth daily. 30 tablet 11   ??? amoxicillin-clavulanate (AUGMENTIN) 875-125 mg per tablet Take 1 tablet by mouth Two (2) times a day for 14 days. 28 tablet 0   ??? aspirin (ECOTRIN) 81 MG tablet Take 81 mg by mouth daily.     ??? atorvastatin (LIPITOR) 40 MG tablet Take 1 tablet (40 mg total) by mouth daily. 90 tablet 11   ??? blood sugar diagnostic (CONTOUR TEST STRIPS) Strp by Other route two (2) times a day. TEST BLOOD SUGARS 2 TIMES DAILY 60 strip 5   ??? blood-glucose meter (ONETOUCH ULTRA SYSTEM KIT) kit Please dispense for patient to use at home to check blood sugars.     ??? carvediloL (COREG) 12.5 MG tablet Take 3 tablets (37.5 mg total) by mouth Two (2) times a day. 180 tablet 11   ??? cloNIDine HCL (CATAPRES) 0.1 MG tablet Take 1 tablet (0.1 mg total) by mouth Two (2) times a day. (Patient not taking: Reported on 02/15/2020) 60 tablet 11   ??? ergocalciferol (VITAMIN D2) 1,250 mcg (50,000 unit) capsule Take 1 capsule (50,000 Units total) by mouth once a week. Take for 8 weeks (Patient not taking: Reported on 02/15/2020) 4 capsule 2   ??? LANCETS,THIN MISC Using at home to test blood sugars twice daily.     ??? magnesium oxide (MAG-OX) 400 mg (241.3 mg elemental magnesium) tablet Take 1 tablet (400 mg total) by mouth Two (2) times a day. 120 tablet 11   ??? melatonin 3 mg Tab Take 2 tablets (6 mg total) by mouth nightly as needed.  0   ??? metFORMIN (GLUCOPHAGE XR) 500 MG 24 hr tablet Take 2 tablets (1,000 mg total) by mouth every morning AND 1 tablet (500 mg total) nightly. 90 tablet 11   ??? metoclopramide (REGLAN) 5 MG tablet Take 1 tablet (5 mg total) by mouth Three (3) times a day as needed (nausea). 90 tablet 1   ??? mycophenolate (MYFORTIC) 180 MG EC tablet HOLD (Patient not taking: Reported on 02/15/2020) 120 tablet 11   ??? nitroglycerin (NITROSTAT) 0.4 MG SL tablet Place 0.4 mg under the tongue daily as needed.     ??? oxyCODONE-acetaminophen (PERCOCET) 5-325 mg per tablet Take 1 tablet by mouth every four (4) hours as needed for pain. 10 tablet 0   ??? pregabalin (LYRICA) 75 MG capsule Take 1 capsule (75 mg total) by mouth every morning AND 2 capsules (150 mg total) nightly. 90 capsule 3   ??? sodium bicarbonate 650 mg tablet Take 1 tablet (650 mg total) by mouth Two (2) times a day. 100 tablet 1   ??? tacrolimus (ENVARSUS XR) 4 mg Tb24 extended release tablet  Take 2 tablets (8 mg total) by mouth every morning. 60 tablet 11     No current facility-administered medications for this visit.        Changes to medications: Rola Reports stopping the following medications: vit d, clonidine    Allergies   Allergen Reactions   ??? Hydrocodone Hives   ??? Lisinopril      Unknown reaction    ??? Perfume        Changes to allergies: No    SPECIALTY MEDICATION ADHERENCE     envarsus 4mg   : 30 days of medicine on hand       Medication Adherence    Patient reported X missed doses in the last month: 0  Specialty Medication: envarsus 4mg           Specialty medication(s) dose(s) confirmed: Regimen is correct and unchanged.     Are there any concerns with adherence? No    Adherence counseling provided? Not needed    CLINICAL MANAGEMENT AND INTERVENTION      Clinical Benefit Assessment:    Do you feel the medicine is effective or helping your condition? Yes    Clinical Benefit counseling provided? Not needed    Adverse Effects Assessment:    Are you experiencing any side effects? No    Are you experiencing difficulty administering your medicine? No    Quality of Life Assessment:    How many days over the past month did your transplant  keep you from your normal activities? For example, brushing your teeth or getting up in the morning. 0    Have you discussed this with your provider? Not needed    Therapy Appropriateness:    Is therapy appropriate? Yes, therapy is appropriate and should be continued    DISEASE/MEDICATION-SPECIFIC INFORMATION      N/A    PATIENT SPECIFIC NEEDS     - Does the patient have any physical, cognitive, or cultural barriers? No    - Is the patient high risk? No    - Does the patient require a Care Management Plan? No     - Does the patient require physician intervention or other additional services (i.e. nutrition, smoking cessation, social work)? No      SHIPPING     Specialty Medication(s) to be Shipped:   na    Other medication(s) to be shipped: norvasc, coreg     Changes to insurance: No    Delivery Scheduled: Yes, Expected medication delivery date: 02/16/2020.     Medication will be delivered via UPS to the confirmed prescription address in [facility].    The patient will receive a drug information handout for each medication shipped and additional FDA Medication Guides as required.  Verified that patient has previously received a Conservation officer, historic buildings.    All of the patient's questions and concerns have been addressed.    Thad Ranger   [facility] Specialty Pharmacist

## 2020-02-16 MED FILL — CARVEDILOL 12.5 MG TABLET: 30 days supply | Qty: 120 | Fill #0 | Status: AC

## 2020-02-20 ENCOUNTER — Other Ambulatory Visit: Payer: Self-pay | Admitting: Internal Medicine

## 2020-02-20 ENCOUNTER — Telehealth: Payer: Self-pay | Admitting: Internal Medicine

## 2020-02-20 DIAGNOSIS — Z940 Kidney transplant status: Principal | ICD-10-CM

## 2020-02-20 DIAGNOSIS — Z79899 Other long term (current) drug therapy: Principal | ICD-10-CM

## 2020-02-20 DIAGNOSIS — Z1231 Encounter for screening mammogram for malignant neoplasm of breast: Secondary | ICD-10-CM

## 2020-02-20 DIAGNOSIS — M25551 Pain in right hip: Secondary | ICD-10-CM | POA: Diagnosis not present

## 2020-02-20 DIAGNOSIS — G8929 Other chronic pain: Secondary | ICD-10-CM | POA: Diagnosis not present

## 2020-02-20 NOTE — Telephone Encounter (Signed)
-----   Message from Ashley Jacobs sent at 02/20/2020  9:07 AM EDT ----- Regarding: RE: mammo order Good morning!  The order is incorrect from 12/01/2019. Thanks. She can call if she calls with that order that's ordered pt will have to call the ofc for a corrected order. ----- Message ----- From: McLean-Scocuzza, Nino Glow, MD Sent: 02/19/2020   6:51 PM EDT To: Ashley Jacobs Subject: RE: mammo order                                Denisa placed mammo screening order 12/01/19 She can just call and schedule for [place]   Why does pt needed img [id number] which is diagnostic ?  Is she having lump/breast pain and if so where?   I.e left outer=3 oclock right outer=9 oclock  ----- Message ----- From: Ashley Jacobs Sent: 02/15/2020   3:32 PM EDT To: Nino Glow McLean-Scocuzza, MD Subject: mammo order                                    Good afternoon!  Pt is needing a current mammo order please. IMG [id number] Thank you!

## 2020-02-20 NOTE — Telephone Encounter (Signed)
Order in for mammogram [place]  Please give pt # to call and schedule mammogram  [phone]  Thanks [place]

## 2020-02-22 NOTE — Telephone Encounter (Signed)
Pt was called and given the number to [place].

## 2020-02-22 NOTE — Telephone Encounter (Signed)
Noted  

## 2020-02-24 ENCOUNTER — Ambulatory Visit (INDEPENDENT_AMBULATORY_CARE_PROVIDER_SITE_OTHER): Payer: Medicare Other | Admitting: Internal Medicine

## 2020-02-24 ENCOUNTER — Encounter: Payer: Self-pay | Admitting: Internal Medicine

## 2020-02-24 ENCOUNTER — Other Ambulatory Visit: Payer: Self-pay

## 2020-02-24 VITALS — BP 90/60 | HR 70 | Temp 97.9°F | Ht 67.5 in | Wt 149.4 lb

## 2020-02-24 DIAGNOSIS — Z940 Kidney transplant status: Principal | ICD-10-CM

## 2020-02-24 DIAGNOSIS — M25551 Pain in right hip: Secondary | ICD-10-CM

## 2020-02-24 DIAGNOSIS — I959 Hypotension, unspecified: Secondary | ICD-10-CM | POA: Diagnosis not present

## 2020-02-24 DIAGNOSIS — G894 Chronic pain syndrome: Secondary | ICD-10-CM | POA: Diagnosis not present

## 2020-02-24 DIAGNOSIS — E559 Vitamin D deficiency, unspecified: Secondary | ICD-10-CM

## 2020-02-24 DIAGNOSIS — I700 Atherosclerosis of aorta: Secondary | ICD-10-CM | POA: Diagnosis not present

## 2020-02-24 DIAGNOSIS — M5416 Radiculopathy, lumbar region: Secondary | ICD-10-CM | POA: Diagnosis not present

## 2020-02-24 DIAGNOSIS — I10 Essential (primary) hypertension: Secondary | ICD-10-CM

## 2020-02-24 DIAGNOSIS — R269 Unspecified abnormalities of gait and mobility: Secondary | ICD-10-CM | POA: Diagnosis not present

## 2020-02-24 DIAGNOSIS — M47812 Spondylosis without myelopathy or radiculopathy, cervical region: Secondary | ICD-10-CM

## 2020-02-24 DIAGNOSIS — M5136 Other intervertebral disc degeneration, lumbar region: Secondary | ICD-10-CM | POA: Diagnosis not present

## 2020-02-24 DIAGNOSIS — I6523 Occlusion and stenosis of bilateral carotid arteries: Secondary | ICD-10-CM | POA: Diagnosis not present

## 2020-02-24 MED ORDER — ERGOCALCIFEROL (VITAMIN D2) 1,250 MCG (50,000 UNIT) CAPSULE
ORAL_CAPSULE | ORAL | 2 refills | 28.00000 days | Status: CP
Start: 2020-02-24 — End: 2021-02-23
  Filled 2020-03-01: qty 4, 28d supply, fill #0

## 2020-02-24 MED ORDER — CARVEDILOL 12.5 MG TABLET
ORAL_TABLET | Freq: Two times a day (BID) | ORAL | 11 refills | 30 days | Status: CP
Start: 2020-02-24 — End: 2021-02-23
  Filled 2020-04-05: qty 60, 30d supply, fill #0

## 2020-02-24 MED ORDER — OXYCODONE-ACETAMINOPHEN 5-325 MG PO TABS: 1.0000 | ORAL_TABLET | Freq: Two times a day (BID) | ORAL | 0 refills | Status: DC | PRN

## 2020-02-24 MED ORDER — CARVEDILOL 12.5 MG PO TABS: 12.5000 mg | ORAL_TABLET | Freq: Two times a day (BID) | ORAL | 3 refills | Status: DC

## 2020-02-24 NOTE — Progress Notes (Signed)
Chief Complaint  Patient presents with  . Follow-up   F/u  1. BP low today 90/60 on coreg 25 mg bid from 37.5 mg bid and norvasc 10 mg was stopped [facility] appt 02/01/20 2. Chronic pain neck, back, now right hip appt Dr. Marry Guan 02/28/20 7.5/10 pain and requests temp supply pain meds, MRI C spine 2021 moderate left facet arthritis C3/4, T spine imaging negative and L spine with advanced DDD L5 impingement.  BL walks with cane and given Rx rollator today  3. S/p kidney transplant 03/28/20 doing well    Review of Systems  Constitutional: Negative for weight loss.  HENT: Negative for hearing loss.   Eyes: Negative for blurred vision.  Respiratory: Negative for shortness of breath.   Cardiovascular: Negative for chest pain.  Gastrointestinal: Negative for abdominal pain.  Musculoskeletal: Positive for back pain, falls, joint pain and neck pain.  Skin: Negative for rash.  Neurological: Positive for tremors.  Psychiatric/Behavioral: Negative for depression.   Past Medical History:  Diagnosis Date  . Anemia of chronic disease   . Anginal pain ([place])   . Carotid arterial disease ([place])    a. 02/2013 U/S: 40-59% bilat ICA stenosis.  . Chronic constipation   . Chronic diastolic CHF (congestive heart failure) ([place])    a. 10/2013 Echo [facility]): EF 55-60%, mod conc LVH, mod MR, mildly dil LA, mild Ao sclerosis w/o stenosis; b. 02/2016 Echo: EF 55-60%, no rwma, Gr DD, mild AS, mod to sev MR, mildly dil LA, PASP 15mmHg; c. 07/2017 Echo [facility]): EF >55%, mild to mod LVH, Gr2 DD, Ao scl, Mod MR, mod dil LA, nl RV fxn.  . Colon polyps   . COPD (chronic obstructive pulmonary disease) ([place])   . Coronary artery disease    a. 05/2013 NSTEMI/PCI: LM 20d, LAD min irregs, LCX small, nl, OM1 nl, RCA dom 1m (2.5x16 Promus DES), PDA1 80p; b. 04/2015 Cath: LM nl, LAD 48m, D1/2 min irregs, LCX 35p/m, OM2/3 min irregs, RCA patent mid stent, RPDA 70ost, RPLB1 30, RPLB2/3 min irregs, EF 55-65%--> Med Rx; c. 06/2017 [facility]):  No ischemia/infarct, EF 64%.  . Diabetes mellitus   . Diabetic neuropathy ([place])   . Diabetic retinopathy ([employer]) 05/28/2013   Hx bilat retinal detachment, proliferative diab retinopathy and bilat vitreous hemorrhage   . Emphysema   . ESRD on hemodialysis ([place])    a. [facility] in [place], [place] [phone]/Dr. [name], on a MWF schedule.  She started dialysis in Feb 2014.  Etiology of renal failure not known, likely diabetes.  Has a left upper arm AV graft.  . History of bronchitis    Mar 2012  . History of pneumonia    June 2012  . History of tobacco abuse    a. Quit 2012.  Marland Kitchen Hyperlipidemia   . Hypertension   . Moderate to severe mitral insufficiency    a. 10/2013 Echo: EF 55-60%, mod MR; b. 02/2016 Echo: EF 55-60%, mod to sev MR directed posteriorly--felt to be dynamic -worse with volume overload; c. 07/2017 Echo [facility]): EF >55%. Mod MR.  . Myocardial infarct ([place]) 05/2013  . Peripheral vascular disease ([place])   . Sickle cell trait ([place])   . Thyroid nodule    [place] 06/2017 due to f/u US in 1 year    Past Surgical History:  Procedure Laterality Date  . A/V FISTULAGRAM Left 11/10/2017   Procedure: A/V FISTULAGRAM;  Surgeon: Katha Cabal, MD;  Location: [place] [facility];  Service: Cardiovascular;  Laterality: Left;  . A/V SHUNTOGRAM Left 02/08/2019   Procedure: A/V SHUNTOGRAM;  Surgeon: Katha Cabal, MD;  Location: [place] [facility];  Service: Cardiovascular;  Laterality: Left;  . ABDOMINAL HYSTERECTOMY     2000  . CARDIAC CATHETERIZATION    . CARDIAC CATHETERIZATION N/A 05/02/2015   Procedure: Left Heart Cath and Coronary Angiography;  Surgeon: [name], MD;  Location: [place] [facility];  Service: Cardiovascular;  Laterality: N/A;  . COLONOSCOPY WITH PROPOFOL N/A 07/08/2016   Procedure: COLONOSCOPY WITH PROPOFOL;  Surgeon: [name], MD;  Location: [facility] ENDOSCOPY;  Service: Endoscopy;  Laterality: N/A;  . COLONOSCOPY WITH PROPOFOL N/A 08/20/2017   Procedure:  COLONOSCOPY WITH PROPOFOL;  Surgeon: [name], MD;  Location: [facility] ENDOSCOPY;  Service: Gastroenterology;  Laterality: N/A;  . colonscopy    . CORONARY ANGIOPLASTY  05/28/2014   stent placement to the mid RCA  . [facility] [facility] with stent placement 11 or 05/2019 and stent removal 06/13/19   . DILATION AND CURETTAGE OF UTERUS     several in the early 80's  . ESOPHAGOGASTRODUODENOSCOPY     2012  . ESOPHAGOGASTRODUODENOSCOPY (EGD) WITH PROPOFOL N/A 03/11/2018   Procedure: ESOPHAGOGASTRODUODENOSCOPY (EGD) WITH PROPOFOL;  Surgeon: [name], MD;  Location: [facility] ENDOSCOPY;  Service: Gastroenterology;  Laterality: N/A;  . EYE SURGERY     bilateral laser 2012  . EYE SURGERY     right  . EYE SURGERY     x4 both eyes  . GAS INSERTION  09/30/2011   Procedure: INSERTION OF GAS;  Surgeon: [name], MD;  Location: [facility];  Service: Ophthalmology;  Laterality: Right;  C3F8  . GAS/FLUID EXCHANGE  09/30/2011   Procedure: GAS/FLUID EXCHANGE;  Surgeon: [name], MD;  Location: [place];  Service: Ophthalmology;  Laterality: Right;  . KIDNEY TRANSPLANT     03/30/19 Dr. Mickel Baas Thomas/Dr. Cristie Hem Zendel/Dr. Gilford Raid; [address] [facility] transplant Dr. Horald Chestnut will be primary [phone] coordinator Sarah Wynkoop [phone]  . KIDNEY TRANSPLANT     03/30/19 [facility]  . LEFT HEART CATHETERIZATION WITH CORONARY ANGIOGRAM N/A 05/28/2013   Procedure: LEFT HEART CATHETERIZATION WITH CORONARY ANGIOGRAM;  Surgeon: [name], MD;  Location: [facility] CATH LAB;  Service: Cardiovascular;  Laterality: N/A;  . PARS PLANA VITRECTOMY  04/22/2011   Procedure: PARS PLANA VITRECTOMY WITH 25 GAUGE;  Surgeon: [name], MD;  Location: [place];  Service: Ophthalmology;  Laterality: Left;  membrane peel, endolaser, gas fluid exchange, silicone oil, repair of complex traction retinal detachment  . PARS PLANA VITRECTOMY  09/30/2011   Procedure: PARS PLANA VITRECTOMY WITH 25 GAUGE;  Surgeon: [name], MD;   Location: [place];  Service: Ophthalmology;  Laterality: Right;  Endolaser; Repair of Complex Traction Retinal Detachment  . PARS PLANA VITRECTOMY  02/24/2012   Procedure: PARS PLANA VITRECTOMY WITH 25 GAUGE;  Surgeon: [name], MD;  Location: [place];  Service: Ophthalmology;  Laterality: Left;  . PTCA    . SILICON OIL REMOVAL  8/75/6433   Procedure: SILICON OIL REMOVAL;  Surgeon: [name], MD;  Location: [place];  Service: Ophthalmology;  Laterality: Left;  . THROMBECTOMY / ARTERIOVENOUS GRAFT REVISION    . TUBAL LIGATION     1979  . UPPER EXTREMITY ANGIOGRAPHY Left 02/08/2019   Procedure: UPPER EXTREMITY ANGIOGRAPHY;  Surgeon: Katha Cabal, MD;  Location: [place] CV [facility];  Service: Cardiovascular;  Laterality: Left;  Family History  Problem Relation Age of Onset  . Stroke Mother   . Heart attack Mother   . Heart disease Mother   . Colon cancer Father   . Colon cancer Sister   . Heart attack Brother   . Stroke Brother   . Diabetes Brother   . Breast cancer Sister   . Diabetes Brother   . Diabetes Daughter   . Renal Disease Daughter        on HD  . Anesthesia problems Neg Hx   . Hypotension Neg Hx   . Malignant hyperthermia Neg Hx   . Pseudochol deficiency Neg Hx    Social History   Socioeconomic History  . Marital status: Single    Spouse name: Not on file  . Number of children: 2  . Years of education: Not on file  . Highest education level: Not on file  Occupational History  . Not on file  Tobacco Use  . Smoking status: Former Smoker    Packs/day: 1.00    Years: 25.00    Pack years: 25.00    Quit date: 06/09/2010    Years since quitting: 9.7  . Smokeless tobacco: Never Used  Vaping Use  . Vaping Use: Never used  Substance and Sexual Activity  . Alcohol use: No  . Drug use: No  . Sexual activity: Never  Other Topics Concern  . Not on file  Social History Narrative   On disability.    Lives with son Brenton Grills   2 children (has son and  daughter)      Son drives her since her vision has decreased   Social Determinants of Health   Financial Resource Strain:   . Difficulty of Paying Living Expenses: Not on file  Food Insecurity:   . Worried About Charity fundraiser in the Last Year: Not on file  . Ran Out of Food in the Last Year: Not on file  Transportation Needs:   . Lack of Transportation (Medical): Not on file  . Lack of Transportation (Non-Medical): Not on file  Physical Activity:   . Days of Exercise per Week: Not on file  . Minutes of Exercise per Session: Not on file  Stress:   . Feeling of Stress : Not on file  Social Connections:   . Frequency of Communication with Friends and Family: Not on file  . Frequency of Social Gatherings with Friends and Family: Not on file  . Attends Religious Services: Not on file  . Active Member of Clubs or Organizations: Not on file  . Attends Archivist Meetings: Not on file  . Marital Status: Not on file  Intimate Partner Violence:   . Fear of Current or Ex-Partner: Not on file  . Emotionally Abused: Not on file  . Physically Abused: Not on file  . Sexually Abused: Not on file   Current Meds  Medication Sig  . albuterol (PROVENTIL) (2.5 MG/3ML) 0.083% nebulizer solution Take 3 mLs (2.5 mg total) by nebulization every 6 (six) hours as needed for wheezing or shortness of breath.  Marland Kitchen albuterol (VENTOLIN HFA) 108 (90 Base) MCG/ACT inhaler Inhale 1-2 puffs into the lungs every 6 (six) hours as needed for wheezing or shortness of breath.  Marland Kitchen aspirin EC 81 MG tablet Take 1 tablet (81 mg total) by mouth at bedtime.  Marland Kitchen atorvastatin (LIPITOR) 40 MG tablet Take 40 mg by mouth daily at 6 PM.  . carvedilol (COREG) 12.5 MG tablet Take 1 tablet (  12.5 mg total) by mouth 2 (two) times daily with a meal.  . magnesium oxide (MAG-OX) 400 MG tablet Take by mouth.  . metFORMIN (GLUCOPHAGE) 500 MG tablet Take by mouth 2 (two) times daily with a meal.  . nitroGLYCERIN (NITROSTAT)  0.4 MG SL tablet Place 1 tablet (0.4 mg total) under the tongue every 5 (five) minutes as needed for chest pain.  . pregabalin (LYRICA) 75 MG capsule Take 1 capsule (75 mg total) by mouth 2 (two) times daily.  . Tacrolimus ER 4 MG TB24 Take 2 tablets by mouth every morning. Total dose 14 mg daily in am  . UNABLE TO FIND TEST BLOOD SUGARS 2 TIMES DAILY  . [DISCONTINUED] carvedilol (COREG) 25 MG tablet Take 1.5 tablets (37.5 mg total) by mouth 2 (two) times daily with a meal.   Allergies  Allergen Reactions  . Hydrocodone Hives  . Metformin Diarrhea and Other (See Comments)    Other reaction(s): Distress (finding)  . Lisinopril     Unknown reaction    No results found for this or any previous visit (from the past 2160 hour(s)). Objective  Body mass index is 23.05 kg/m. Wt Readings from Last 3 Encounters:  02/24/20 149 lb 6.4 oz (67.8 kg)  01/04/20 148 lb 12.8 oz (67.5 kg)  12/01/19 144 lb (65.3 kg)   Temp Readings from Last 3 Encounters:  02/24/20 97.9 F (36.6 C) (Oral)  10/10/19 (!) 96.5 F (35.8 C)  02/08/19 98 F (36.7 C) (Oral)   BP Readings from Last 3 Encounters:  02/24/20 90/60  01/04/20 (!) 116/61  12/01/19 (!) 109/57   Pulse Readings from Last 3 Encounters:  02/24/20 70  12/01/19 72  11/02/19 69    Physical Exam Vitals and nursing note reviewed.  Constitutional:      Appearance: Normal appearance. She is well-developed and well-groomed.  HENT:     Head: Normocephalic and atraumatic.  Eyes:     Conjunctiva/sclera: Conjunctivae normal.     Pupils: Pupils are equal, round, and reactive to light.  Cardiovascular:     Rate and Rhythm: Normal rate and regular rhythm.     Heart sounds: Normal heart sounds. No murmur heard.   Pulmonary:     Effort: Pulmonary effort is normal.     Breath sounds: Normal breath sounds.  Skin:    General: Skin is warm and dry.  Neurological:     General: No focal deficit present.     Mental Status: She is alert and oriented  to person, place, and time. Mental status is at baseline.     Gait: Gait abnormal.     Comments: BL w/cane   Psychiatric:        Attention and Perception: Attention and perception normal.        Mood and Affect: Mood and affect normal.        Speech: Speech normal.        Behavior: Behavior normal. Behavior is cooperative.        Thought Content: Thought content normal.        Cognition and Memory: Cognition and memory normal.        Judgment: Judgment normal.     Assessment  Plan  Hypotension with h/o HTN - Plan: carvedilol (COREG) 12.5 MG tablet bid reduced from 25 mg bid  Off norvasc 10 mg per [facility] 02/01/20 appt   Bilateral carotid artery stenosis - Plan: US Carotid Duplex Bilateral  Right hip pain - Plan: oxyCODONE-acetaminophen (PERCOCET) 5-325  MG tablet F/u ortho Dr. Marry Guan 02/28/20  Abnormal gait Rx rollator today   Chronic pain syndrome DDD (degenerative disc disease), lumbar Facet arthritis of cervical region Lumbar radiculopathy -temp supply percocet today   Hypercalcemia Vitamin D deficiency  -Ca 11.1 02/01/20 labs, vit D 17.4  -will ask [facility] address and f/u both and treat vit D if needed  And w/u hypercalcemia if needed   HM Flu shot due 04/02/20 pt will call back  pna 23utd prevnar vaccine needed in future call back for nurse  covid last 03/03/20 [facility] pfizer 2/2 03/03/20 will get  Tetanus UTD1/1/12 Hep B immunehad engerix as well 4 doses 2014-2015 Consider shingrix in futurediscprevpt unsure if insurance will cover it  HCV neg 04/25/19  Hep B sAb 98.50 03/28/19  HIV neg 03/28/19   Labs reviewed this visit 02/01/20 [facility] labs H/h 12.0/36.1, cr 1.15, calcium 11.1 H, alb 3.8, K 5.1, GFR 59, vit D 17.4 A1C 10/05/19 5.6   Pap neg 11/13/16 neg HPVs/p hysterectomy -will ask in future if h/o abnormal pap if so will do pap if not d/c paps   mammo negsch 03/12/20   h/o multiple polypscolonoscopy 08/20/17 repeat in 3 years [facility] or [facility]  can do this  -EGD 03/11/18 neg gastritis -EGD ulcers 04/2019 neg HSV/H pylori [facility] gi egd 08/25/17 several polyps due 08/25/20 tubular adenoma  Former smoker x 25-30 years <1 ppd CTA chest 8/6/19moderate b/l pleural effusions and CAD and aortic atherosclerosis, no nodules  Thyroid nodules/parathyroid mass   [place] 06/16/17 right upper thyroid nodule ? Parathyroid mass 1.1 cm and left mid nodule f/u annually  Thyroid US 02/01/20 [facility] Additional similar solid cystic or cystic nodules identified, without suspicious features.  Incidentally noted nonocclusive atherosclerotic plaque noted in the right common carotid artery.  IMPRESSION:  No suspicious thyroid nodules identified.    GI appt [facility] 06/20/19 f/u esophageal ulcers  Renal [facility] renal and transplant s/p kidney transplant 03/28/20     Provider: Dr. Olivia Mackie McLean-Scocuzza-Internal Medicine

## 2020-02-24 NOTE — Patient Instructions (Addendum)
Additional similar solid cystic or cystic nodules identified, without suspicious features.   Incidentally noted nonocclusive atherosclerotic plaque noted in the right common carotid artery.   IMPRESSION:  No suspicious thyroid nodules identified.   Call back for nurse visit  Flu shot due 04/02/20 prevnar due 05/03/20    [place]  [place], [place] [address]  [phone]  Estelle June, MD  [address]  [address]  [place], [place] [address]  [phone]  [phone] (Fax)     Pneumococcal Conjugate Vaccine suspension for injection What is this medicine? PNEUMOCOCCAL VACCINE (NEU mo KOK al vak SEEN) is a vaccine used to prevent pneumococcus bacterial infections. These bacteria can cause serious infections like pneumonia, meningitis, and blood infections. This vaccine will lower your chance of getting pneumonia. If you do get pneumonia, it can make your symptoms milder and your illness shorter. This vaccine will not treat an infection and will not cause infection. This vaccine is recommended for infants and young children, adults with certain medical conditions, and adults 90 years or older. This medicine may be used for other purposes; ask your health care provider or pharmacist if you have questions. COMMON BRAND NAME(S): Prevnar, Prevnar 13 What should I tell my health care provider before I take this medicine? They need to know if you have any of these conditions:  bleeding problems  fever  immune system problems  an unusual or allergic reaction to pneumococcal vaccine, diphtheria toxoid, other vaccines, latex, other medicines, foods, dyes, or preservatives  pregnant or trying to get pregnant  breast-feeding How should I use this medicine? This vaccine is for injection into a muscle. It is given by a health care professional. A copy of Vaccine Information Statements will be given before each  vaccination. Read this sheet carefully each time. The sheet may change frequently. Talk to your pediatrician regarding the use of this medicine in children. While this drug may be prescribed for children as young as 4 weeks old for selected conditions, precautions do apply. Overdosage: If you think you have taken too much of this medicine contact a poison control center or emergency room at once. NOTE: This medicine is only for you. Do not share this medicine with others. What if I miss a dose? It is important not to miss your dose. Call your doctor or health care professional if you are unable to keep an appointment. What may interact with this medicine?  medicines for cancer chemotherapy  medicines that suppress your immune function  steroid medicines like prednisone or cortisone This list may not describe all possible interactions. Give your health care provider a list of all the medicines, herbs, non-prescription drugs, or dietary supplements you use. Also tell them if you smoke, drink alcohol, or use illegal drugs. Some items may interact with your medicine. What should I watch for while using this medicine? Mild fever and pain should go away in 3 days or less. Report any unusual symptoms to your doctor or health care professional. What side effects may I notice from receiving this medicine? Side effects that you should report to your doctor or health care professional as soon as possible:  allergic reactions like skin rash, itching or hives, swelling of the face, lips, or tongue  breathing problems  confused  fast or irregular heartbeat  fever over 102 degrees F  seizures  unusual bleeding or bruising  unusual muscle weakness Side effects that usually do not require  medical attention (report to your doctor or health care professional if they continue or are bothersome):  aches and pains  diarrhea  fever of 102 degrees F or less  headache  irritable  loss of  appetite  pain, tender at site where injected  trouble sleeping This list may not describe all possible side effects. Call your doctor for medical advice about side effects. You may report side effects to FDA at 1-800-FDA-1088. Where should I keep my medicine? This does not apply. This vaccine is given in a clinic, pharmacy, doctor's office, or other health care setting and will not be stored at home. NOTE: This sheet is a summary. It may not cover all possible information. If you have questions about this medicine, talk to your doctor, pharmacist, or health care provider.  2020 Elsevier/Gold Standard (2014-03-02 10:27:27)

## 2020-02-24 NOTE — Unmapped (Signed)
Patient called to say her Vit D was low with her PCP and would like her to start vit D again. Reorder and sent to [facility]. BP too low (90/50-60 at least 3 times a week) and PCP wants to decrease coreg 12.5 BID. She will take BP 3x week and let me know if BP too high or low.    Reports she is doing well    Will check vit D again in october

## 2020-02-27 DIAGNOSIS — Z79899 Other long term (current) drug therapy: Principal | ICD-10-CM

## 2020-02-27 DIAGNOSIS — Z940 Kidney transplant status: Principal | ICD-10-CM

## 2020-02-27 DIAGNOSIS — M25551 Pain in right hip: Secondary | ICD-10-CM | POA: Diagnosis not present

## 2020-02-27 DIAGNOSIS — G8929 Other chronic pain: Secondary | ICD-10-CM | POA: Diagnosis not present

## 2020-02-27 DIAGNOSIS — M47812 Spondylosis without myelopathy or radiculopathy, cervical region: Secondary | ICD-10-CM | POA: Insufficient documentation

## 2020-02-27 DIAGNOSIS — G894 Chronic pain syndrome: Secondary | ICD-10-CM | POA: Insufficient documentation

## 2020-02-27 DIAGNOSIS — M5416 Radiculopathy, lumbar region: Secondary | ICD-10-CM | POA: Insufficient documentation

## 2020-02-27 DIAGNOSIS — M533 Sacrococcygeal disorders, not elsewhere classified: Secondary | ICD-10-CM | POA: Diagnosis not present

## 2020-02-27 HISTORY — DX: Hypercalcemia: E83.52

## 2020-02-28 ENCOUNTER — Telehealth: Payer: Self-pay

## 2020-02-28 DIAGNOSIS — M7061 Trochanteric bursitis, right hip: Secondary | ICD-10-CM | POA: Diagnosis not present

## 2020-02-28 DIAGNOSIS — M79604 Pain in right leg: Secondary | ICD-10-CM | POA: Diagnosis not present

## 2020-02-28 LAB — URINALYSIS
BILIRUBIN UA: NEGATIVE
GLUCOSE UA: NEGATIVE
KETONES UA: NEGATIVE
LEUKOCYTE ESTERASE UA: NEGATIVE
NITRITE UA: NEGATIVE
PH UA: 6 (ref 5.0–7.5)
PROTEIN UA: NEGATIVE
SPECIFIC GRAVITY UA: 1.013 (ref 1.005–1.030)

## 2020-02-28 LAB — MICROSCOPIC EXAMINATION: CASTS: NONE SEEN /LPF

## 2020-02-28 LAB — GLUCOSE UA: Glucose:PrThr:Pt:Urine:Ord:: NEGATIVE

## 2020-02-28 LAB — RBC URINE

## 2020-02-28 MED ORDER — SODIUM BICARBONATE 650 MG TABLET
ORAL_TABLET | Freq: Two times a day (BID) | ORAL | 11 refills | 30.00000 days | Status: CP
Start: 2020-02-28 — End: 2021-02-27
  Filled 2020-03-01: qty 60, 30d supply, fill #0

## 2020-02-28 NOTE — Telephone Encounter (Signed)
-----   Message from Delorise Jackson, MD sent at 02/27/2020  7:51 PM EDT ----- Fax note [facility] please with on cover BP meds changed coreg 12.5 mg bid from 25 mg bid Will they address hypercalcemia 11.1 and low vitamin D noted on labs 8/25/21ThanksTMS

## 2020-02-28 NOTE — Telephone Encounter (Signed)
Faxed via epic routing

## 2020-03-01 MED FILL — ATORVASTATIN 40 MG TABLET: 90 days supply | Qty: 90 | Fill #1 | Status: AC

## 2020-03-01 MED FILL — METFORMIN ER 500 MG TABLET,EXTENDED RELEASE 24 HR: 30 days supply | Qty: 60 | Fill #0 | Status: AC

## 2020-03-01 MED FILL — ATORVASTATIN 40 MG TABLET: ORAL | 90 days supply | Qty: 90 | Fill #1

## 2020-03-01 MED FILL — ERGOCALCIFEROL (VITAMIN D2) 1,250 MCG (50,000 UNIT) CAPSULE: 28 days supply | Qty: 4 | Fill #0 | Status: AC

## 2020-03-01 MED FILL — SODIUM BICARBONATE 650 MG TABLET: 30 days supply | Qty: 60 | Fill #0 | Status: AC

## 2020-03-02 DIAGNOSIS — N3000 Acute cystitis without hematuria: Principal | ICD-10-CM

## 2020-03-02 MED ORDER — AMOXICILLIN 875 MG-POTASSIUM CLAVULANATE 125 MG TABLET
ORAL_TABLET | Freq: Two times a day (BID) | ORAL | 0 refills | 14 days | Status: CP
Start: 2020-03-02 — End: 2020-03-16

## 2020-03-02 NOTE — Unmapped (Signed)
Reviewed UC with Dr Margaretmary Bayley and he would like patient to start augmentin 875mg  BID x 2 weeks    Called and updated patient and she verbalized understanding    Denies any urinary problems, fevers, abdominal pain and will call if any symptoms start    Monday getting a block in her back and if pain gets better than they know it is her back and if not it is her nerves    Will let me know

## 2020-03-05 DIAGNOSIS — Z940 Kidney transplant status: Principal | ICD-10-CM

## 2020-03-05 DIAGNOSIS — Z79899 Other long term (current) drug therapy: Principal | ICD-10-CM

## 2020-03-05 DIAGNOSIS — M79604 Pain in right leg: Secondary | ICD-10-CM | POA: Diagnosis not present

## 2020-03-05 DIAGNOSIS — M1611 Unilateral primary osteoarthritis, right hip: Secondary | ICD-10-CM | POA: Diagnosis not present

## 2020-03-06 ENCOUNTER — Telehealth: Payer: Self-pay | Admitting: Internal Medicine

## 2020-03-06 DIAGNOSIS — R413 Other amnesia: Secondary | ICD-10-CM | POA: Diagnosis not present

## 2020-03-06 DIAGNOSIS — R251 Tremor, unspecified: Secondary | ICD-10-CM | POA: Diagnosis not present

## 2020-03-06 NOTE — Telephone Encounter (Signed)
Left message to return call. Patient's disability benefits paperwork has been completed and placed upfront

## 2020-03-07 NOTE — Unmapped (Signed)
[facility] Refill Coordination Note    Specialty Medication(s) to be Shipped:   Transplant: Envarsus 4mg     Other medication(s) to be shipped: No additional medications requested for fill at this time     Michele Cisneros, DOB: 01-29-1956  Phone: [phone] (home)       All above HIPAA information was verified with patient.     Was a Nurse, learning disability used for this call? No    Completed refill call assessment today to schedule patient's medication shipment from the [facility] [phone]).       Specialty medication(s) and dose(s) confirmed: Regimen is correct and unchanged.   Changes to medications: [name] reports no changes at this time.  Changes to insurance: No  Questions for the pharmacist: No    Confirmed patient received Welcome Packet with first shipment. The patient will receive a drug information handout for each medication shipped and additional FDA Medication Guides as required.       DISEASE/MEDICATION-SPECIFIC INFORMATION        N/A    SPECIALTY MEDICATION ADHERENCE     Medication Adherence    Patient reported X missed doses in the last month: 0  Specialty Medication: ENVARSUS XR 4MG    Patient is on additional specialty medications: No  Informant: patient  Confirmed plan for next specialty medication refill: delivery by pharmacy  Refills needed for supportive medications: not needed          Refill Coordination    Has the Patients' Contact Information Changed: No  Is the Shipping Address Different: No           ENVARSUS XR  4 mg: 7 days of medicine on hand          SHIPPING     Shipping address confirmed in Epic.     Delivery Scheduled: Yes, Expected medication delivery date: 10/1.     Medication will be delivered via Next Day Courier to the prescription address in Epic WAM.    Jolene Schimke   [facility] Specialty Technician

## 2020-03-08 MED FILL — ENVARSUS XR 4 MG TABLET,EXTENDED RELEASE: 30 days supply | Qty: 60 | Fill #2 | Status: AC

## 2020-03-08 MED FILL — PREGABALIN 75 MG CAPSULE: ORAL | 30 days supply | Qty: 90 | Fill #2

## 2020-03-08 MED FILL — PREGABALIN 75 MG CAPSULE: 30 days supply | Qty: 90 | Fill #2 | Status: AC

## 2020-03-08 MED FILL — ENVARSUS XR 4 MG TABLET,EXTENDED RELEASE: ORAL | 30 days supply | Qty: 60 | Fill #2

## 2020-03-12 ENCOUNTER — Other Ambulatory Visit: Payer: Self-pay

## 2020-03-12 ENCOUNTER — Ambulatory Visit
Admission: RE | Admit: 2020-03-12 | Discharge: 2020-03-12 | Disposition: A | Discharge status: Home / Self Care | Payer: Medicare HMO | Source: Ambulatory Visit | Attending: Internal Medicine | Admitting: Internal Medicine

## 2020-03-12 DIAGNOSIS — Z79899 Other long term (current) drug therapy: Principal | ICD-10-CM

## 2020-03-12 DIAGNOSIS — Z940 Kidney transplant status: Principal | ICD-10-CM

## 2020-03-12 DIAGNOSIS — Z1231 Encounter for screening mammogram for malignant neoplasm of breast: Secondary | ICD-10-CM | POA: Insufficient documentation

## 2020-03-15 ENCOUNTER — Ambulatory Visit
Admission: RE | Admit: 2020-03-15 | Discharge: 2020-03-15 | Disposition: A | Discharge status: Home / Self Care | Payer: Medicare HMO | Source: Ambulatory Visit | Attending: Internal Medicine | Admitting: Internal Medicine

## 2020-03-15 ENCOUNTER — Other Ambulatory Visit: Payer: Self-pay | Admitting: Internal Medicine

## 2020-03-15 ENCOUNTER — Other Ambulatory Visit: Payer: Self-pay

## 2020-03-15 DIAGNOSIS — R921 Mammographic calcification found on diagnostic imaging of breast: Secondary | ICD-10-CM

## 2020-03-15 DIAGNOSIS — R928 Other abnormal and inconclusive findings on diagnostic imaging of breast: Secondary | ICD-10-CM | POA: Diagnosis present

## 2020-03-16 NOTE — Telephone Encounter (Signed)
Called and spoke with the patient. She states she has come in to pick this up

## 2020-03-19 DIAGNOSIS — Z940 Kidney transplant status: Principal | ICD-10-CM

## 2020-03-19 DIAGNOSIS — Z79899 Other long term (current) drug therapy: Principal | ICD-10-CM

## 2020-03-20 LAB — URINALYSIS
BILIRUBIN UA: NEGATIVE
BLOOD UA: NEGATIVE
GLUCOSE UA: NEGATIVE
KETONES UA: NEGATIVE
LEUKOCYTE ESTERASE UA: NEGATIVE
NITRITE UA: NEGATIVE
PH UA: 7 (ref 5.0–7.5)
PROTEIN UA: NEGATIVE
SPECIFIC GRAVITY UA: 1.009 (ref 1.005–1.030)

## 2020-03-20 LAB — MICROSCOPIC EXAMINATION
BACTERIA: NONE SEEN
WBC URINE: NONE SEEN /HPF (ref 0–5)

## 2020-03-20 LAB — RBC URINE: Erythrocytes:Naric:Pt:Urine sed:Qn:Microscopy.light.HPF: NONE SEEN

## 2020-03-20 LAB — UA, MICROSCOPIC

## 2020-03-26 DIAGNOSIS — Z940 Kidney transplant status: Principal | ICD-10-CM

## 2020-03-26 DIAGNOSIS — Z79899 Other long term (current) drug therapy: Principal | ICD-10-CM

## 2020-03-29 MED FILL — SODIUM BICARBONATE 650 MG TABLET: ORAL | 30 days supply | Qty: 60 | Fill #1

## 2020-03-29 MED FILL — ERGOCALCIFEROL (VITAMIN D2) 1,250 MCG (50,000 UNIT) CAPSULE: 28 days supply | Qty: 4 | Fill #1 | Status: AC

## 2020-03-29 MED FILL — METFORMIN ER 500 MG TABLET,EXTENDED RELEASE 24 HR: ORAL | 30 days supply | Qty: 60 | Fill #1

## 2020-03-29 MED FILL — METFORMIN ER 500 MG TABLET,EXTENDED RELEASE 24 HR: 30 days supply | Qty: 60 | Fill #1 | Status: AC

## 2020-03-29 MED FILL — ERGOCALCIFEROL (VITAMIN D2) 1,250 MCG (50,000 UNIT) CAPSULE: ORAL | 28 days supply | Qty: 4 | Fill #1

## 2020-03-29 MED FILL — SODIUM BICARBONATE 650 MG TABLET: 30 days supply | Qty: 60 | Fill #1 | Status: AC

## 2020-04-02 ENCOUNTER — Ambulatory Visit: Payer: Medicare Other

## 2020-04-02 DIAGNOSIS — Z940 Kidney transplant status: Principal | ICD-10-CM

## 2020-04-02 DIAGNOSIS — Z79899 Other long term (current) drug therapy: Principal | ICD-10-CM

## 2020-04-03 DIAGNOSIS — R29898 Other symptoms and signs involving the musculoskeletal system: Principal | ICD-10-CM

## 2020-04-03 DIAGNOSIS — Z940 Kidney transplant status: Principal | ICD-10-CM

## 2020-04-03 NOTE — Unmapped (Signed)
update unos form

## 2020-04-03 NOTE — Unmapped (Signed)
Patient called to report left sided pain that started today and that her urine smelled old in the morning and in the afternoon.    Patient denies urinary frequency, cloudiness, pain with urination, or fever    Asked patient to leave a urine sample at the lab in the morning and that if pain persisted or increased to go to the ER.    Patient asked if she could see ortho.neuro at [facility] for second opinion for increased weakness to right leg. Referrals entered.    Went over upcoming apts

## 2020-04-04 ENCOUNTER — Other Ambulatory Visit: Payer: Self-pay

## 2020-04-04 ENCOUNTER — Ambulatory Visit (INDEPENDENT_AMBULATORY_CARE_PROVIDER_SITE_OTHER): Payer: Medicare HMO

## 2020-04-04 DIAGNOSIS — Z940 Kidney transplant status: Principal | ICD-10-CM

## 2020-04-04 DIAGNOSIS — Z23 Encounter for immunization: Secondary | ICD-10-CM

## 2020-04-04 NOTE — Unmapped (Signed)
[facility] Refill Coordination Note    Specialty Medication(s) to be Shipped:   Transplant: Envarsus 4mg     Other medication(s) to be shipped: pregabalin, carvedilol and magnesium     Michele Cisneros, DOB: 1955/12/09  Phone: [phone] (home)       All above HIPAA information was verified with patient.     Was a Nurse, learning disability used for this call? No    Completed refill call assessment today to schedule patient's medication shipment from the [facility] [phone]).       Specialty medication(s) and dose(s) confirmed: Regimen is correct and unchanged.   Changes to medications: Marcine reports no changes at this time.  Changes to insurance: No  Questions for the pharmacist: No    Confirmed patient received Welcome Packet with first shipment. The patient will receive a drug information handout for each medication shipped and additional FDA Medication Guides as required.       DISEASE/MEDICATION-SPECIFIC INFORMATION        N/A    SPECIALTY MEDICATION ADHERENCE     Medication Adherence    Patient reported X missed doses in the last month: 0  Specialty Medication: Envarsus 4mg   Patient is on additional specialty medications: No        Envarsus 4 mg: 3 days of medicine on hand     SHIPPING     Shipping address confirmed in Epic.     Delivery Scheduled: Yes, Expected medication delivery date: 04/06/2020.     Medication will be delivered via Next Day Courier to the prescription address in Epic WAM.    [name]   [facility] Specialty Technician

## 2020-04-05 DIAGNOSIS — Z940 Kidney transplant status: Principal | ICD-10-CM

## 2020-04-05 LAB — URINALYSIS
BILIRUBIN UA: NEGATIVE
BLOOD UA: NEGATIVE
GLUCOSE UA: NEGATIVE
KETONES UA: NEGATIVE
LEUKOCYTE ESTERASE UA: NEGATIVE
NITRITE UA: NEGATIVE
PH UA: 7.5 (ref 5.0–7.5)
PROTEIN UA: NEGATIVE
SPECIFIC GRAVITY UA: 1.008 (ref 1.005–1.030)
UROBILINOGEN UA: 0.2 mg/dL (ref 0.2–1.0)

## 2020-04-05 LAB — MICROSCOPIC EXAMINATION
BACTERIA: NONE SEEN
CASTS: NONE SEEN /LPF
EPITHELIAL CELLS (NON RENAL): NONE SEEN /HPF (ref 0–10)
RBC URINE: NONE SEEN /HPF (ref 0–2)
WBC URINE: NONE SEEN /HPF (ref 0–5)

## 2020-04-05 MED FILL — MAGNESIUM OXIDE 400 MG (241.3 MG MAGNESIUM) TABLET: 60 days supply | Qty: 120 | Fill #4 | Status: AC

## 2020-04-05 MED FILL — MAGNESIUM OXIDE 400 MG (241.3 MG MAGNESIUM) TABLET: ORAL | 60 days supply | Qty: 120 | Fill #4

## 2020-04-05 MED FILL — ENVARSUS XR 4 MG TABLET,EXTENDED RELEASE: 7 days supply | Qty: 14 | Fill #3 | Status: AC

## 2020-04-05 MED FILL — PREGABALIN 75 MG CAPSULE: 30 days supply | Qty: 90 | Fill #3 | Status: AC

## 2020-04-05 MED FILL — CARVEDILOL 12.5 MG TABLET: 30 days supply | Qty: 60 | Fill #0 | Status: AC

## 2020-04-05 MED FILL — ENVARSUS XR 4 MG TABLET,EXTENDED RELEASE: ORAL | 7 days supply | Qty: 14 | Fill #3

## 2020-04-05 MED FILL — PREGABALIN 75 MG CAPSULE: ORAL | 30 days supply | Qty: 90 | Fill #3

## 2020-04-06 ENCOUNTER — Encounter: Admit: 2020-04-06 | Discharge: 2020-04-07 | Payer: MEDICARE

## 2020-04-06 ENCOUNTER — Ambulatory Visit: Admit: 2020-04-06 | Discharge: 2020-04-07 | Payer: MEDICARE | Attending: Nephrology | Primary: Nephrology

## 2020-04-06 NOTE — Unmapped (Signed)
Patient called and said that she is sick and cannot come to clinic today. Reports abdominal pain, n/v/d, and not feeling well.    Instructed patient to go ER and that we will reschedule her apt    Verbalized understanding

## 2020-04-09 DIAGNOSIS — Z79899 Other long term (current) drug therapy: Principal | ICD-10-CM

## 2020-04-09 DIAGNOSIS — Z940 Kidney transplant status: Principal | ICD-10-CM

## 2020-04-10 ENCOUNTER — Other Ambulatory Visit: Payer: Self-pay | Admitting: Internal Medicine

## 2020-04-10 DIAGNOSIS — I6523 Occlusion and stenosis of bilateral carotid arteries: Secondary | ICD-10-CM

## 2020-04-11 ENCOUNTER — Ambulatory Visit: Admit: 2020-04-11 | Discharge: 2020-04-12 | Payer: MEDICARE | Attending: Nephrology | Primary: Nephrology

## 2020-04-11 ENCOUNTER — Telehealth: Payer: Self-pay | Admitting: Internal Medicine

## 2020-04-11 DIAGNOSIS — R911 Solitary pulmonary nodule: Principal | ICD-10-CM

## 2020-04-11 DIAGNOSIS — E041 Nontoxic single thyroid nodule: Principal | ICD-10-CM

## 2020-04-11 DIAGNOSIS — I739 Peripheral vascular disease, unspecified: Principal | ICD-10-CM

## 2020-04-11 DIAGNOSIS — I6523 Occlusion and stenosis of bilateral carotid arteries: Principal | ICD-10-CM

## 2020-04-11 DIAGNOSIS — Z940 Kidney transplant status: Principal | ICD-10-CM

## 2020-04-11 DIAGNOSIS — I5032 Chronic diastolic (congestive) heart failure: Principal | ICD-10-CM

## 2020-04-11 DIAGNOSIS — I110 Hypertensive heart disease with heart failure: Principal | ICD-10-CM

## 2020-04-11 DIAGNOSIS — Z862 Personal history of diseases of the blood and blood-forming organs and certain disorders involving the immune mechanism: Principal | ICD-10-CM

## 2020-04-11 DIAGNOSIS — M5116 Intervertebral disc disorders with radiculopathy, lumbar region: Principal | ICD-10-CM

## 2020-04-11 DIAGNOSIS — Z955 Presence of coronary angioplasty implant and graft: Principal | ICD-10-CM

## 2020-04-11 DIAGNOSIS — Z87448 Personal history of other diseases of urinary system: Principal | ICD-10-CM

## 2020-04-11 DIAGNOSIS — R011 Cardiac murmur, unspecified: Principal | ICD-10-CM

## 2020-04-11 DIAGNOSIS — I38 Endocarditis, valve unspecified: Principal | ICD-10-CM

## 2020-04-11 DIAGNOSIS — N189 Chronic kidney disease, unspecified: Secondary | ICD-10-CM

## 2020-04-11 DIAGNOSIS — Z87440 Personal history of urinary (tract) infections: Principal | ICD-10-CM

## 2020-04-11 DIAGNOSIS — M6281 Muscle weakness (generalized): Principal | ICD-10-CM

## 2020-04-11 DIAGNOSIS — Z90710 Acquired absence of both cervix and uterus: Principal | ICD-10-CM

## 2020-04-11 DIAGNOSIS — D649 Anemia, unspecified: Principal | ICD-10-CM

## 2020-04-11 DIAGNOSIS — Z4822 Encounter for aftercare following kidney transplant: Principal | ICD-10-CM

## 2020-04-11 DIAGNOSIS — J449 Chronic obstructive pulmonary disease, unspecified: Principal | ICD-10-CM

## 2020-04-11 DIAGNOSIS — I2510 Atherosclerotic heart disease of native coronary artery without angina pectoris: Principal | ICD-10-CM

## 2020-04-11 DIAGNOSIS — I951 Orthostatic hypotension: Principal | ICD-10-CM

## 2020-04-11 DIAGNOSIS — R197 Diarrhea, unspecified: Principal | ICD-10-CM

## 2020-04-11 DIAGNOSIS — E1142 Type 2 diabetes mellitus with diabetic polyneuropathy: Principal | ICD-10-CM

## 2020-04-11 DIAGNOSIS — D849 Immunodeficiency, unspecified: Principal | ICD-10-CM

## 2020-04-11 DIAGNOSIS — Z79899 Other long term (current) drug therapy: Principal | ICD-10-CM

## 2020-04-11 DIAGNOSIS — R55 Syncope and collapse: Principal | ICD-10-CM

## 2020-04-11 DIAGNOSIS — Z7982 Long term (current) use of aspirin: Principal | ICD-10-CM

## 2020-04-11 DIAGNOSIS — K222 Esophageal obstruction: Principal | ICD-10-CM

## 2020-04-11 LAB — CBC W/ AUTO DIFF
BASOPHILS ABSOLUTE COUNT: 0 10*9/L (ref 0.0–0.1)
BASOPHILS RELATIVE PERCENT: 0.5 %
EOSINOPHILS ABSOLUTE COUNT: 0.2 10*9/L (ref 0.0–0.7)
EOSINOPHILS RELATIVE PERCENT: 3.6 %
HEMATOCRIT: 32 % — ABNORMAL LOW (ref 35.0–44.0)
HEMOGLOBIN: 10.8 g/dL — ABNORMAL LOW (ref 12.0–15.5)
LYMPHOCYTES ABSOLUTE COUNT: 0.4 10*9/L — ABNORMAL LOW (ref 0.7–4.0)
LYMPHOCYTES RELATIVE PERCENT: 7.1 %
MEAN CORPUSCULAR HEMOGLOBIN: 31.9 pg (ref 26.0–34.0)
MEAN CORPUSCULAR VOLUME: 94.7 fL (ref 82.0–98.0)
MEAN PLATELET VOLUME: 9.5 fL (ref 7.0–10.0)
MONOCYTES ABSOLUTE COUNT: 0.2 10*9/L (ref 0.1–1.0)
MONOCYTES RELATIVE PERCENT: 3.4 %
NEUTROPHILS RELATIVE PERCENT: 85.4 %
PLATELET COUNT: 188 10*9/L (ref 150–450)
RED BLOOD CELL COUNT: 3.38 10*12/L — ABNORMAL LOW (ref 3.90–5.03)
RED CELL DISTRIBUTION WIDTH: 15.5 % — ABNORMAL HIGH (ref 12.0–15.0)
WBC ADJUSTED: 5.6 10*9/L (ref 3.5–10.5)

## 2020-04-11 LAB — BASIC METABOLIC PANEL
ANION GAP: 5 mmol/L (ref 5–14)
BLOOD UREA NITROGEN: 16 mg/dL (ref 9–23)
BUN / CREAT RATIO: 11
CALCIUM: 10.7 mg/dL — ABNORMAL HIGH (ref 8.7–10.4)
CHLORIDE: 108 mmol/L — ABNORMAL HIGH (ref 98–107)
CO2: 24.2 mmol/L (ref 20.0–31.0)
EGFR CKD-EPI AA FEMALE: 45 mL/min/{1.73_m2} — ABNORMAL LOW (ref >=60)
EGFR CKD-EPI NON-AA FEMALE: 39 mL/min/{1.73_m2} — ABNORMAL LOW (ref >=60)
GLUCOSE RANDOM: 118 mg/dL — ABNORMAL HIGH (ref 70–99)
POTASSIUM: 4.8 mmol/L — ABNORMAL HIGH (ref 3.4–4.5)
SODIUM: 137 mmol/L (ref 135–145)

## 2020-04-11 LAB — MAGNESIUM: Magnesium:MCnc:Pt:Ser/Plas:Qn:: 2.4

## 2020-04-11 LAB — CREATININE: Creatinine:MCnc:Pt:Ser/Plas:Qn:: 1.43 — ABNORMAL HIGH

## 2020-04-11 LAB — PHOSPHORUS: Phosphate:MCnc:Pt:Ser/Plas:Qn:: 3.1

## 2020-04-11 LAB — HEMOGLOBIN A1C: ESTIMATED AVERAGE GLUCOSE: 88 mg/dL

## 2020-04-11 LAB — CHOLESTEROL/HDL RATIO SCREEN: Lab: 3

## 2020-04-11 LAB — IRON SATURATION: Iron saturation:MFr:Pt:Ser/Plas:Qn:: 15

## 2020-04-11 LAB — LIPID PANEL
CHOLESTEROL/HDL RATIO SCREEN: 3 (ref 1.0–4.5)
HDL CHOLESTEROL: 41 mg/dL (ref 40–60)
LDL CHOLESTEROL CALCULATED: 55 mg/dL (ref 40–99)
TRIGLYCERIDES: 129 mg/dL (ref 0–150)
VLDL CHOLESTEROL CAL: 25.8 mg/dL (ref 11–41)

## 2020-04-11 LAB — PARATHYROID HORMONE INTACT: Parathyrin.intact:MCnc:Pt:Ser/Plas:Qn:: 198.2 — ABNORMAL HIGH

## 2020-04-11 LAB — IRON PANEL: IRON: 35 ug/dL — ABNORMAL LOW

## 2020-04-11 LAB — ESTIMATED AVERAGE GLUCOSE: Estimated average glucose:MCnc:Pt:Bld:Qn:Estimated from glycated hemoglobin: 88

## 2020-04-11 LAB — BASOPHILS RELATIVE PERCENT: Basophils/100 leukocytes:NFr:Pt:Bld:Qn:Automated count: 0.5

## 2020-04-11 MED ORDER — CARVEDILOL 6.25 MG TABLET
ORAL_TABLET | Freq: Two times a day (BID) | ORAL | 3 refills | 90.00000 days | Status: CP
Start: 2020-04-11 — End: 2021-04-11
  Filled 2020-04-16: qty 180, 90d supply, fill #0

## 2020-04-11 MED ORDER — ATORVASTATIN 20 MG TABLET
ORAL_TABLET | Freq: Every day | ORAL | 3 refills | 90.00000 days | Status: CP
Start: 2020-04-11 — End: 2021-04-11
  Filled 2020-04-16: qty 90, 90d supply, fill #0

## 2020-04-11 MED ORDER — ESOMEPRAZOLE MAGNESIUM 20 MG CAPSULE,DELAYED RELEASE
ORAL_CAPSULE | Freq: Every day | ORAL | 3 refills | 90 days | Status: CP
Start: 2020-04-11 — End: 2021-04-11
  Filled 2020-04-16: qty 90, 90d supply, fill #0

## 2020-04-11 NOTE — Telephone Encounter (Signed)
I left vm for pt to call ofc to sch [place].

## 2020-04-11 NOTE — Unmapped (Signed)
Transplant Nephrology Clinic Visit    Assessment/Plan:  Michele Cisneros is a 64 y.o. female s/p deceased donor kidney transplant on 03/30/2019 for native kidney ESRD presumed secondary to diabetic nephropathy. Active medical issues include:      S/P deceased donor kidney transplant with no history of rejection or DSAs and stable renal function.   - Serum creatinine 1.43  (baseline range 0.9-1.4 mg/dL).     - DSA screens negative (most recently 02/01/20)   - UP/C has been normal, UA unremarkable todoay  - BK viral load negative 10/05/19.      Immunosuppression Management   - Tacrolimus trough level 6.8 on 02/01/20 and pending today (target 6-10).    - Continue Envarsus XR 8 mg daily.   - Myfortic was on hold starting 09/12/19 due to leukopenia and is now on hold due to diarrhea.   - If diarrhea resolves with discontinuation of metformin today will consider resuming low dose Myfortic soon.    History of post-transplant UTI:   - Proteus isolated on 11/28/19, 12/26/19,02/27/20 and 03/19/20 with citrobacter also isolated 03/19/20  - S/P Augmentin x 14 days ending 03/16/20 with follow up culture 04/04/20 mixed flora   - Currently without symptoms of UTI, UA does not show pyuria or hematuria  - Will get [place] of native and transplant kidneys to assure no stones in view of proteus isolates.    Leukopenia, likely medication related, resolved off Myfortic  - Myfortic and Bactrim were stopped 09/12/19 with improvement in WBC now to the normal range.      CMV viremia  - Viral load pending today (most recently negagtive on 02/01/20).   - On no therapy currently.    Hypertension, with postural hypotension and syncope   - BP remains <120 systolic at home despite tapering off clonidine and stopping amlodipine and reducing carvedilol.   - Will reduce carvedilol from 12.5 mg bid to 6.25 mg bid.    Coronary artery disease, s/p RCA stent 2015, history of HFpEF secondary to diastolic dysfunction/mitral regurgitation (moderate to severe)  - no symptoms of cardiac ischemia presently   - will reduce atorvastatin to 20 mg daily, LDL is 55 and patient has muscle weakness.  - Continue aspirin and carvedilol  - DOE may be due to MR but there are no other findings of heart failure.  - Will plan periodic repeat echocardiogram (last echo 03/29/19)    Pulmonary Nodule with History of Smoking Associated COPD:   - Subcentimeter nodule seen on NM Stress 07/07/17 and CT chest 05/02/19 stable from previous studies 03/18/16 and 03/07/14  - Needs periodic low dose CT to screen for lung CA, now due and will be scheduled.     Lumbar Radiculopathy Secondary to L5-S1 disc disease and Peripheral Neuropathy secondary to DM   - Following with [facility]  - Will see ortho at [facility]  - Continue Lyrica.      Thyroid Nodule:   - [place] 02/01/20 revealed no nodules suggestive of cancer     DM, type II, controlled:  - Metformin will be stopped due to intermittent diarrhea and excellent control  - Hgb A1C 4.7, fasting glucoses recently normal.    Esophageal stenosis, history of esophageal ulcers:   - No current symptoms   - S/P dilation 07/06/19 with resolution of ulcers compared to 05/04/19 EGD  - Prescribed Nexium today for GERD symptoms    Post-Transplant Anemia:  - Hgb is 10.8.  - No current indications for ESA therapy  Tooth Infection:  - Symptoms resolved.   - She will follow with her local dentist    Health Maintenance:   Mammo: 03/12/20 in [place]  Pap Smear: s/p hysterectomy  Colonoscopy: 08/20/2017; repeat due in March 2022 due to multiple tubular adenomas  Lung cancer screening: due for low dose CT chest in 04/2020  Renal US: pending from today    Immunizations:   Flu: 04/04/20  Prevnar 13: not noted in records.  Will get after 1 year post transplant.  Pneumovax: 03/23/18  COVID-19: Patient is due for dose #3 of COVID-19 vaccine. She is aware of limited efficacy of the vaccine.    Follow-Up: Return to clinic in 2 months      Interval History:   She presents today with several complaints. Last week she had nausea and diarrhea. This resolved spontaneously. This pattern of intermittent diarrhea preceded transplant, but has worsened since transplant. She is on metformin 500 mg bid and Myfortic is on hold. She continues to have bilateral leg weakness(R>L) and her neurologist told her she has severe neuropathy and started her on Mirapex. She stopped Mirapex due to side effects. She still requires a cane for ambulation. She continues to have postural dizziness and near syncope. Carvedilol dose is now 12.5 mg bid and this is her only antihypertensive. Home BP has been 110-120 systolic. She denies chest pain or edema. She has DOE that is chronic and unchanged. Tooth pain and facial swelling resolved with Augmentin. She saw a dentist but no procedure was done.     Myfortic has been on hold for leukopenia and diarrhea. WBC has now normalized. Proteus mirabilis was isolated from her urine 11/28/19, 12/26/19, 02/27/20, and 03/19/20. The culture on 03/19/20 also grew citrobacter. She was treated with Augmentin for 14 days ending 03/16/20. The proteus was sensitive to Augmentin though Citrobacter was resistant.  Follow up culture on 10/27 grew mixed flora. She denies dysuria, urinary retention, incontinence, tenderness over the bladder or transplant, fever or chills. Hand tremors are mild. She has intermittent early morning headaches.     She has completed 2 doses of COVID-19 vaccine and had her flu vaccine. She continues to smoke.    Transplant History:    Native Kidney Disease: Diabetic nephropathy   Pre-transplant PRA: 70%   Organ Received: DDKT, DBD, KDPI: 70%  CMV/EBV Status: CMV D-/R+  EBV D+/R+  Resp culture: + strep pneumo  Date of Transplant: 03/30/2019  Implantation biopsy: mild ATN, otherwise normal.  Post-transplant complication - DGF  Induction: Campath with rapid steroid discontinuation  Maintenance Immunosuppression: Tacrolimus/Myfotic  Date of Ureteral Stent Removal: 06/13/2019  Current Immunosuppression: Tacrolimus/Myfortic  Results of Renal Imaging (pre and post):      Pre-Txp 07/07/17  -Kidneys are small with mildly increased echogenicity bilaterally. No solid masses or calculi in the visualized kidneys. No hydronephrosis    CT Abdomen/Pelvis w/o Contrast 03/29/2019 (pre-txp)  -1.2 cm hypodensity in the right kidney lower pole (2:48), increased in size from prior however incompletely characterized given lack of intravenous contrast.  -Uterus is surgically absent.    Post-Txp 05/08/2019 (txp kidney only)  --The renal transplant was located in the left lower quadrant. Normal size and echogenicity.  No solid masses or calculi. No perinephric collections identified. Double-J ureteral stent with proximal tip within the renal pelvis and distal tip within the bladder. Mild caliectasis. No hydronephrosis.  --Interval increase in resistive indices within the arcuate, segmental and main renal arteries measuring up to 0.98. Attention on follow-up.  --  Improved perfusion in the upper pole of the transplant kidney, now with normal perfusion throughout the renal parenchyma.    Current Immunosuppression Regimen:   Envarsus 12 mg daily  Myfortic on hold since 09/12/19    Other Past Medical History  1. Diabetes mellitus, type 2, diagnosed 2012.   2. Diabetic peripheral neuropathy  3. Diabetic retinopathy, s/p laser surgery, s/p retinal detachment  4. Hypertension, diagnosed 2012.   5. Mixed hyperlipidemia.   6. Coronary artery disease, s/p NSTEMI 05/2013, PCI RCA, 20% KNm 80% PDA 1. Cardiac cath 05/02/15 LAD 20% and 30% lesions, Cx 35%, RPDA 70%, patent right coronary stent  7. HFpEF with EF 65-70% on echo 03/29/19 with  moderate to severe MR  8. COPD, emphysema secondary to smoking  9. Peripheral artery disease  10. Carotid artery stenosis 1-39% bilateral stenosis 12/2017  10. Sickle Cell Trait  11. S/P TAH/BSO 2000  12. Lumbar radiculopathy secondary to L5-S1 disc bulge   13. History of thyroid nodule  14. Colonic polyps removed 08/10/17, pathology tubular adenomas,  follow up in 3 years  15. Right breast soft tissue mass noted 07/07/17 stable in appearance compared to past imaging  16. Pulmonary nodule noted 07/07/17 similar in size to lesion as far back as 2015.   17. OSA on CPAP    Review of Systems    Otherwise as per HPI, all other systems reviewed and are negative.    Medications  Current Outpatient Medications   Medication Sig Dispense Refill   ??? acetaminophen (TYLENOL) 500 MG tablet Take 1-2 tablets (500-1,000 mg total) by mouth every six (6) hours as needed for pain or fever (> 38C or 100.3F). 30 tablet 0   ??? albuterol (PROVENTIL HFA;VENTOLIN HFA) 90 mcg/actuation inhaler Inhale 2 puffs every six (6) hours as needed for wheezing.     ??? aspirin (ECOTRIN) 81 MG tablet Take 81 mg by mouth daily.     ??? atorvastatin (LIPITOR) 40 MG tablet Take 1 tablet (40 mg total) by mouth daily. 90 tablet 11   ??? blood sugar diagnostic (CONTOUR TEST STRIPS) Strp by Other route two (2) times a day. TEST BLOOD SUGARS 2 TIMES DAILY 60 strip 5   ??? blood-glucose meter (ONETOUCH ULTRA SYSTEM KIT) kit Please dispense for patient to use at home to check blood sugars.     ??? carvediloL (COREG) 12.5 MG tablet Take 1 tablet (12.5 mg total) by mouth Two (2) times a day. 60 tablet 11   ??? ergocalciferol-1,250 mcg, 50,000 unit, (VITAMIN D2-1,250 MCG, 50,000 UNIT,) 1,250 mcg (50,000 unit) capsule Take 1 capsule (1,250 mcg total) by mouth once a week. Take for 8 weeks 4 capsule 2   ??? LANCETS,THIN MISC Using at home to test blood sugars twice daily.     ??? magnesium oxide (MAG-OX) 400 mg (241.3 mg elemental magnesium) tablet Take 1 tablet (400 mg total) by mouth Two (2) times a day. 120 tablet 11   ??? melatonin 3 mg Tab Take 2 tablets (6 mg total) by mouth nightly as needed.  0   ??? metFORMIN (GLUCOPHAGE XR) 500 MG 24 hr tablet Take 1 tablet (500 mg total) by mouth two (2) times a day. 60 tablet 11   ??? metoclopramide (REGLAN) 5 MG tablet Take 1 tablet (5 mg total) by mouth Three (3) times a day as needed (nausea). 90 tablet 1   ??? mycophenolate (MYFORTIC) 180 MG EC tablet HOLD (Patient not taking: Reported on 02/15/2020) 120 tablet 11   ???  nitroglycerin (NITROSTAT) 0.4 MG SL tablet Place 0.4 mg under the tongue daily as needed.     ??? pregabalin (LYRICA) 75 MG capsule Take 1 capsule (75 mg total) by mouth every morning AND 2 capsules (150 mg total) nightly. 90 capsule 3   ??? sodium bicarbonate 650 mg tablet Take 1 tablet (650 mg total) by mouth Two (2) times a day. 60 tablet 11   ??? tacrolimus (ENVARSUS XR) 4 mg Tb24 extended release tablet Take 2 tablets (8 mg total) by mouth every morning. 60 tablet 11     No current facility-administered medications for this visit.           Physical Exam  BP 166/61 (BP Site: R Arm, BP Position: Sitting, BP Cuff Size: Medium)  - Pulse 75  - Temp 36.2 ??C (97.2 ??F) (Temporal)  - Wt 68.8 kg (151 lb 9.6 oz)  - BMI 23.39 kg/m??   General: no acute distress  HEENT: anicteric, left facial swelling  Neck: neck supple, no cervical lymphadenopathy appreciated  CV: grade 2/6 systolic murmur  Lungs: clear to auscultation bilaterally  Abdomen: soft, tender with palpation over graft site   Extremities: no edema  Neurologic: uses cane for walking, bilateral LE weakness       Laboratory Data  Recent Results (from the past 170 hour(s))   Urinalysis    Collection Time: 04/04/20  9:00 AM   Result Value Ref Range    Specific Gravity, UA 1.008 1.005 - 1.030    pH, UA 7.5 5.0 - 7.5    Color, UA Yellow Yellow    Clarity, UA Clear Clear    Leukocyte Esterase, UA Negative Negative    Protein, UA Negative Negative/Trace    Glucose, UA Negative Negative    Ketones, UA Negative Negative    Blood, UA Negative Negative    Bilirubin, UA Negative Negative    Urobilinogen, UA 0.2 0.2 - 1.0 mg/dL    Nitrite, UA Negative Negative    UA, Microscopic Comment     Microscopic Exam See below:    Culture, Urine    Collection Time: 04/04/20  9:00 AM    Specimen: Clean Catch; Urine UR   Result Value Ref Range    Urine Culture, Comprehensive Final report     Result 1 Comment    Microscopic Examination    Collection Time: 04/04/20  9:00 AM   Result Value Ref Range    WBC, UA None seen 0 - 5 /hpf    RBC, UA None seen 0 - 2 /hpf    Epithelial Cells (non renal) None seen 0 - 10 /hpf    Casts None seen None seen /lpf    Bacteria, UA None seen None seen/Few

## 2020-04-11 NOTE — Unmapped (Signed)
Saw patient in clinic with sister. States she is feeling much better today.    No more diarrhea, nausea or pain since last Friday.    Denies any urinary frequency, pain, odor, or other concerns    Denies dizziness, CP, heart palpitations, abdominal pain, swelling, or fevers    Intermittent HA in the morning in the back of her head. Has cervical problems. Referral placed and ortho called to set up apt.  Patient to call them back . Local neurologist called her back and told her she had bursitis in her right hip. Still weakness in right leg and shakiness to legs with standing. Needs help with ADLs. Using cane    Reports anxiety attacks. States she wakes up after she has a bad dream.    +SOB with exertion    +tremors. Spine MD started her on Mirapex 0.125mg  BID for tremors. States it did not help and she stopped after 2 weeks    BP at home 120/67, checking 2 x week. Sometimes BP < 100/50    Drinking 4 bottles and soda a day    Needs labs today, took envarsus at 9am yesterday

## 2020-04-12 LAB — CMV DNA, QUANTITATIVE, PCR: CMV VIRAL LD: NOT DETECTED

## 2020-04-12 LAB — EBV VIRAL LOAD RESULT: Lab: NOT DETECTED

## 2020-04-12 LAB — CMV VIRAL LD: Lab: NOT DETECTED

## 2020-04-12 LAB — TACROLIMUS, TROUGH: Lab: 3.7 — ABNORMAL LOW

## 2020-04-13 LAB — VITAMIN D, TOTAL (25OH): Lab: 35.4

## 2020-04-14 LAB — VITAMIN D 1,25-DIHYDROXY: 1,25-Dihydroxyvitamin D:MCnc:Pt:Ser/Plas:Qn:: 45

## 2020-04-16 DIAGNOSIS — Z940 Kidney transplant status: Principal | ICD-10-CM

## 2020-04-16 DIAGNOSIS — Z79899 Other long term (current) drug therapy: Principal | ICD-10-CM

## 2020-04-16 MED FILL — CARVEDILOL 6.25 MG TABLET: 90 days supply | Qty: 180 | Fill #0 | Status: AC

## 2020-04-16 MED FILL — ESOMEPRAZOLE MAGNESIUM 20 MG CAPSULE,DELAYED RELEASE: 90 days supply | Qty: 90 | Fill #0 | Status: AC

## 2020-04-16 MED FILL — ATORVASTATIN 20 MG TABLET: 90 days supply | Qty: 90 | Fill #0 | Status: AC

## 2020-04-18 ENCOUNTER — Emergency Department: Payer: Medicare HMO

## 2020-04-18 ENCOUNTER — Other Ambulatory Visit: Payer: Self-pay

## 2020-04-18 ENCOUNTER — Inpatient Hospital Stay: Payer: Medicare HMO

## 2020-04-18 ENCOUNTER — Inpatient Hospital Stay
Admission: EM | Admit: 2020-04-18 | Discharge: 2020-04-21 | DRG: 291 | Disposition: A | Discharge status: Home / Self Care | Payer: Medicare HMO | Attending: Obstetrics and Gynecology | Admitting: Obstetrics and Gynecology

## 2020-04-18 ENCOUNTER — Encounter: Payer: Self-pay | Admitting: Internal Medicine

## 2020-04-18 ENCOUNTER — Inpatient Hospital Stay (HOSPITAL_COMMUNITY)
Admit: 2020-04-18 | Discharge: 2020-04-18 | Disposition: A | Discharge status: Home / Self Care | Payer: Medicare HMO | Attending: Physician Assistant | Admitting: Physician Assistant

## 2020-04-18 DIAGNOSIS — I340 Nonrheumatic mitral (valve) insufficiency: Secondary | ICD-10-CM

## 2020-04-18 DIAGNOSIS — N1832 Chronic kidney disease, stage 3b: Secondary | ICD-10-CM | POA: Diagnosis present

## 2020-04-18 DIAGNOSIS — R0602 Shortness of breath: Secondary | ICD-10-CM | POA: Diagnosis not present

## 2020-04-18 DIAGNOSIS — N1831 Chronic kidney disease, stage 3a: Secondary | ICD-10-CM | POA: Diagnosis not present

## 2020-04-18 DIAGNOSIS — N183 Chronic kidney disease, stage 3 unspecified: Secondary | ICD-10-CM | POA: Diagnosis not present

## 2020-04-18 DIAGNOSIS — D573 Sickle-cell trait: Secondary | ICD-10-CM | POA: Diagnosis present

## 2020-04-18 DIAGNOSIS — Z8249 Family history of ischemic heart disease and other diseases of the circulatory system: Secondary | ICD-10-CM

## 2020-04-18 DIAGNOSIS — Z8719 Personal history of other diseases of the digestive system: Secondary | ICD-10-CM

## 2020-04-18 DIAGNOSIS — I509 Heart failure, unspecified: Secondary | ICD-10-CM | POA: Diagnosis present

## 2020-04-18 DIAGNOSIS — J9601 Acute respiratory failure with hypoxia: Secondary | ICD-10-CM | POA: Diagnosis present

## 2020-04-18 DIAGNOSIS — Z20822 Contact with and (suspected) exposure to covid-19: Secondary | ICD-10-CM | POA: Diagnosis present

## 2020-04-18 DIAGNOSIS — Z833 Family history of diabetes mellitus: Secondary | ICD-10-CM | POA: Diagnosis not present

## 2020-04-18 DIAGNOSIS — E1140 Type 2 diabetes mellitus with diabetic neuropathy, unspecified: Secondary | ICD-10-CM | POA: Diagnosis present

## 2020-04-18 DIAGNOSIS — G894 Chronic pain syndrome: Secondary | ICD-10-CM | POA: Diagnosis present

## 2020-04-18 DIAGNOSIS — G4733 Obstructive sleep apnea (adult) (pediatric): Secondary | ICD-10-CM | POA: Diagnosis present

## 2020-04-18 DIAGNOSIS — I159 Secondary hypertension, unspecified: Secondary | ICD-10-CM | POA: Diagnosis present

## 2020-04-18 DIAGNOSIS — E785 Hyperlipidemia, unspecified: Secondary | ICD-10-CM | POA: Diagnosis present

## 2020-04-18 DIAGNOSIS — D631 Anemia in chronic kidney disease: Secondary | ICD-10-CM | POA: Diagnosis present

## 2020-04-18 DIAGNOSIS — I5043 Acute on chronic combined systolic (congestive) and diastolic (congestive) heart failure: Secondary | ICD-10-CM | POA: Diagnosis present

## 2020-04-18 DIAGNOSIS — I252 Old myocardial infarction: Secondary | ICD-10-CM

## 2020-04-18 DIAGNOSIS — J449 Chronic obstructive pulmonary disease, unspecified: Secondary | ICD-10-CM | POA: Diagnosis present

## 2020-04-18 DIAGNOSIS — Z955 Presence of coronary angioplasty implant and graft: Secondary | ICD-10-CM

## 2020-04-18 DIAGNOSIS — E875 Hyperkalemia: Secondary | ICD-10-CM | POA: Diagnosis present

## 2020-04-18 DIAGNOSIS — I5032 Chronic diastolic (congestive) heart failure: Secondary | ICD-10-CM | POA: Diagnosis present

## 2020-04-18 DIAGNOSIS — Z992 Dependence on renal dialysis: Secondary | ICD-10-CM

## 2020-04-18 DIAGNOSIS — E1122 Type 2 diabetes mellitus with diabetic chronic kidney disease: Secondary | ICD-10-CM | POA: Diagnosis present

## 2020-04-18 DIAGNOSIS — Z79899 Other long term (current) drug therapy: Secondary | ICD-10-CM

## 2020-04-18 DIAGNOSIS — R0600 Dyspnea, unspecified: Secondary | ICD-10-CM | POA: Diagnosis not present

## 2020-04-18 DIAGNOSIS — I130 Hypertensive heart and chronic kidney disease with heart failure and stage 1 through stage 4 chronic kidney disease, or unspecified chronic kidney disease: Principal | ICD-10-CM | POA: Diagnosis present

## 2020-04-18 DIAGNOSIS — K5909 Other constipation: Secondary | ICD-10-CM | POA: Diagnosis present

## 2020-04-18 DIAGNOSIS — Z9851 Tubal ligation status: Secondary | ICD-10-CM

## 2020-04-18 DIAGNOSIS — I5033 Acute on chronic diastolic (congestive) heart failure: Secondary | ICD-10-CM

## 2020-04-18 DIAGNOSIS — I959 Hypotension, unspecified: Secondary | ICD-10-CM

## 2020-04-18 DIAGNOSIS — N179 Acute kidney failure, unspecified: Secondary | ICD-10-CM | POA: Diagnosis present

## 2020-04-18 DIAGNOSIS — J90 Pleural effusion, not elsewhere classified: Secondary | ICD-10-CM | POA: Diagnosis not present

## 2020-04-18 DIAGNOSIS — I25118 Atherosclerotic heart disease of native coronary artery with other forms of angina pectoris: Secondary | ICD-10-CM | POA: Diagnosis present

## 2020-04-18 DIAGNOSIS — I351 Nonrheumatic aortic (valve) insufficiency: Secondary | ICD-10-CM

## 2020-04-18 DIAGNOSIS — E1151 Type 2 diabetes mellitus with diabetic peripheral angiopathy without gangrene: Secondary | ICD-10-CM | POA: Diagnosis present

## 2020-04-18 DIAGNOSIS — E113599 Type 2 diabetes mellitus with proliferative diabetic retinopathy without macular edema, unspecified eye: Secondary | ICD-10-CM | POA: Diagnosis present

## 2020-04-18 DIAGNOSIS — I10 Essential (primary) hypertension: Secondary | ICD-10-CM

## 2020-04-18 DIAGNOSIS — Z794 Long term (current) use of insulin: Secondary | ICD-10-CM | POA: Diagnosis not present

## 2020-04-18 DIAGNOSIS — Z87891 Personal history of nicotine dependence: Secondary | ICD-10-CM

## 2020-04-18 DIAGNOSIS — Z888 Allergy status to other drugs, medicaments and biological substances status: Secondary | ICD-10-CM

## 2020-04-18 DIAGNOSIS — Z940 Kidney transplant status: Secondary | ICD-10-CM

## 2020-04-18 DIAGNOSIS — N189 Chronic kidney disease, unspecified: Secondary | ICD-10-CM | POA: Diagnosis present

## 2020-04-18 DIAGNOSIS — E119 Type 2 diabetes mellitus without complications: Secondary | ICD-10-CM

## 2020-04-18 DIAGNOSIS — Z885 Allergy status to narcotic agent status: Secondary | ICD-10-CM

## 2020-04-18 DIAGNOSIS — Z7982 Long term (current) use of aspirin: Secondary | ICD-10-CM

## 2020-04-18 DIAGNOSIS — I700 Atherosclerosis of aorta: Secondary | ICD-10-CM | POA: Diagnosis present

## 2020-04-18 DIAGNOSIS — I350 Nonrheumatic aortic (valve) stenosis: Secondary | ICD-10-CM

## 2020-04-18 DIAGNOSIS — R2689 Other abnormalities of gait and mobility: Secondary | ICD-10-CM | POA: Diagnosis present

## 2020-04-18 DIAGNOSIS — I160 Hypertensive urgency: Secondary | ICD-10-CM | POA: Diagnosis present

## 2020-04-18 DIAGNOSIS — Z7984 Long term (current) use of oral hypoglycemic drugs: Secondary | ICD-10-CM

## 2020-04-18 HISTORY — DX: Hyperkalemia: E87.5

## 2020-04-18 LAB — CBC
HCT: 27.8 % — ABNORMAL LOW (ref 36.0–46.0)
Hemoglobin: 9.1 g/dL — ABNORMAL LOW (ref 12.0–15.0)
MCH: 31.6 pg (ref 26.0–34.0)
MCHC: 32.7 g/dL (ref 30.0–36.0)
MCV: 96.5 fL (ref 80.0–100.0)
Platelets: 182 10*3/uL (ref 150–400)
RBC: 2.88 MIL/uL — ABNORMAL LOW (ref 3.87–5.11)
RDW: 15 % (ref 11.5–15.5)
WBC: 5.9 10*3/uL (ref 4.0–10.5)
nRBC: 0.3 % — ABNORMAL HIGH (ref 0.0–0.2)

## 2020-04-18 LAB — COMPREHENSIVE METABOLIC PANEL
ALT: 9 U/L (ref 0–44)
AST: 14 U/L — ABNORMAL LOW (ref 15–41)
Albumin: 3.5 g/dL (ref 3.5–5.0)
Alkaline Phosphatase: 98 U/L (ref 38–126)
Anion gap: 7 (ref 5–15)
BUN: 23 mg/dL (ref 8–23)
CO2: 22 mmol/L (ref 22–32)
Calcium: 9.5 mg/dL (ref 8.9–10.3)
Chloride: 107 mmol/L (ref 98–111)
Creatinine, Ser: 1.37 mg/dL — ABNORMAL HIGH (ref 0.44–1.00)
GFR, Estimated: 44 mL/min — ABNORMAL LOW (ref >60)
Glucose, Bld: 124 mg/dL — ABNORMAL HIGH (ref 70–99)
Potassium: 5.6 mmol/L — ABNORMAL HIGH (ref 3.5–5.1)
Sodium: 136 mmol/L (ref 135–145)
Total Bilirubin: 1 mg/dL (ref 0.3–1.2)
Total Protein: 6.8 g/dL (ref 6.5–8.1)

## 2020-04-18 LAB — TROPONIN I (HIGH SENSITIVITY)
Troponin I (High Sensitivity): 11 ng/L (ref <18)
Troponin I (High Sensitivity): 11 ng/L
Troponin I (High Sensitivity): 12 ng/L (ref <18)

## 2020-04-18 LAB — RESPIRATORY PANEL BY RT PCR (FLU A&B, COVID)
Influenza A by PCR: NEGATIVE
Influenza B by PCR: NEGATIVE
SARS Coronavirus 2 by RT PCR: NEGATIVE

## 2020-04-18 LAB — GLUCOSE, CAPILLARY: Glucose-Capillary: 174 mg/dL — ABNORMAL HIGH (ref 70–99)

## 2020-04-18 LAB — POTASSIUM: Potassium: 5.3 mmol/L — ABNORMAL HIGH (ref 3.5–5.1)

## 2020-04-18 LAB — HEMOGLOBIN A1C
Hgb A1c MFr Bld: 5 % (ref 4.8–5.6)
Mean Plasma Glucose: 96.8 mg/dL

## 2020-04-18 LAB — LIPASE, BLOOD: Lipase: 19 U/L (ref 11–51)

## 2020-04-18 LAB — HIV ANTIBODY (ROUTINE TESTING W REFLEX): HIV Screen 4th Generation wRfx: NONREACTIVE

## 2020-04-18 LAB — BRAIN NATRIURETIC PEPTIDE: B Natriuretic Peptide: 657.1 pg/mL — ABNORMAL HIGH (ref 0.0–100.0)

## 2020-04-18 MED ORDER — ENOXAPARIN SODIUM 40 MG/0.4ML ~~LOC~~ SOLN
40.0000 mg | SUBCUTANEOUS | Status: DC
  Administered 2020-04-18 – 2020-04-20 (×3): 40 mg via SUBCUTANEOUS
  Filled 2020-04-18 (×3): qty 0.4

## 2020-04-18 MED ORDER — SODIUM CHLORIDE 0.9 % IV SOLN: 250.0000 mL | INTRAVENOUS | Status: DC | PRN

## 2020-04-18 MED ORDER — PREGABALIN 75 MG PO CAPS
75.0000 mg | ORAL_CAPSULE | Freq: Two times a day (BID) | ORAL | Status: DC
  Administered 2020-04-18 – 2020-04-21 (×6): 75 mg via ORAL
  Filled 2020-04-18 (×7): qty 1

## 2020-04-18 MED ORDER — CARVEDILOL 12.5 MG PO TABS
12.5000 mg | ORAL_TABLET | Freq: Two times a day (BID) | ORAL | Status: DC
  Administered 2020-04-19 – 2020-04-21 (×5): 12.5 mg via ORAL
  Filled 2020-04-18 (×5): qty 1

## 2020-04-18 MED ORDER — TACROLIMUS ER 4 MG PO TB24: 8.0000 | ORAL_TABLET | Freq: Every morning | ORAL | Status: DC

## 2020-04-18 MED ORDER — TACROLIMUS ER 4 MG PO TB24: 2.0000 | ORAL_TABLET | Freq: Every morning | ORAL | Status: DC

## 2020-04-18 MED ORDER — NITROGLYCERIN 2 % TD OINT
1.0000 [in_us] | TOPICAL_OINTMENT | Freq: Four times a day (QID) | TRANSDERMAL | Status: DC
  Administered 2020-04-18 – 2020-04-19 (×3): 1 [in_us] via TOPICAL
  Filled 2020-04-18 (×3): qty 1

## 2020-04-18 MED ORDER — SODIUM CHLORIDE 0.9% FLUSH
3.0000 mL | Freq: Two times a day (BID) | INTRAVENOUS | Status: DC
  Administered 2020-04-18 – 2020-04-21 (×6): 3 mL via INTRAVENOUS

## 2020-04-18 MED ORDER — FUROSEMIDE 10 MG/ML IJ SOLN: 40.0000 mg | Freq: Every day | INTRAMUSCULAR | Status: DC

## 2020-04-18 MED ORDER — FUROSEMIDE 10 MG/ML IJ SOLN
40.0000 mg | Freq: Once | INTRAMUSCULAR | Status: AC
  Administered 2020-04-18: 40 mg via INTRAVENOUS
  Filled 2020-04-18: qty 4

## 2020-04-18 MED ORDER — ASPIRIN EC 81 MG PO TBEC
81.0000 mg | DELAYED_RELEASE_TABLET | Freq: Every day | ORAL | Status: DC
  Administered 2020-04-19 – 2020-04-20 (×2): 81 mg via ORAL
  Filled 2020-04-18 (×3): qty 1

## 2020-04-18 MED ORDER — TACROLIMUS ER 4 MG PO TB24
8.0000 mg | ORAL_TABLET | Freq: Every morning | ORAL | Status: DC
  Administered 2020-04-19 – 2020-04-21 (×3): 8 mg via ORAL
  Filled 2020-04-18 (×4): qty 8

## 2020-04-18 MED ORDER — TRAMADOL HCL 50 MG PO TABS
50.0000 mg | ORAL_TABLET | ORAL | Status: AC
  Administered 2020-04-18: 50 mg via ORAL
  Filled 2020-04-18: qty 1

## 2020-04-18 MED ORDER — NITROGLYCERIN 0.4 MG SL SUBL: 0.4000 mg | SUBLINGUAL_TABLET | SUBLINGUAL | Status: DC | PRN

## 2020-04-18 MED ORDER — SODIUM CHLORIDE 0.9% FLUSH: 3.0000 mL | INTRAVENOUS | Status: DC | PRN

## 2020-04-18 MED ORDER — SODIUM ZIRCONIUM CYCLOSILICATE 10 G PO PACK
10.0000 g | PACK | Freq: Every day | ORAL | Status: DC
  Administered 2020-04-19 – 2020-04-20 (×2): 10 g via ORAL
  Filled 2020-04-18 (×3): qty 1

## 2020-04-18 MED ORDER — MAGNESIUM OXIDE 400 (241.3 MG) MG PO TABS
400.0000 mg | ORAL_TABLET | Freq: Every day | ORAL | Status: DC
  Administered 2020-04-19 – 2020-04-21 (×3): 400 mg via ORAL
  Filled 2020-04-18 (×4): qty 1

## 2020-04-18 MED ORDER — ALBUTEROL SULFATE (2.5 MG/3ML) 0.083% IN NEBU: 2.5000 mg | INHALATION_SOLUTION | Freq: Four times a day (QID) | RESPIRATORY_TRACT | Status: DC | PRN

## 2020-04-18 MED ORDER — OXYCODONE-ACETAMINOPHEN 5-325 MG PO TABS
1.0000 | ORAL_TABLET | Freq: Two times a day (BID) | ORAL | Status: DC | PRN
  Administered 2020-04-18 – 2020-04-21 (×7): 1 via ORAL
  Filled 2020-04-18 (×7): qty 1

## 2020-04-18 MED ORDER — ONDANSETRON HCL 4 MG/2ML IJ SOLN: 4.0000 mg | Freq: Four times a day (QID) | INTRAMUSCULAR | Status: DC | PRN

## 2020-04-18 MED ORDER — ACETAMINOPHEN 325 MG PO TABS: 650.0000 mg | ORAL_TABLET | ORAL | Status: DC | PRN

## 2020-04-18 MED ORDER — ATORVASTATIN CALCIUM 20 MG PO TABS
40.0000 mg | ORAL_TABLET | Freq: Every day | ORAL | Status: DC
  Administered 2020-04-19 – 2020-04-20 (×2): 40 mg via ORAL
  Filled 2020-04-18 (×2): qty 2

## 2020-04-18 MED ORDER — INSULIN ASPART 100 UNIT/ML ~~LOC~~ SOLN
0.0000 [IU] | Freq: Three times a day (TID) | SUBCUTANEOUS | Status: DC
  Administered 2020-04-19 (×2): 1 [IU] via SUBCUTANEOUS
  Filled 2020-04-18 (×2): qty 1

## 2020-04-18 NOTE — ED Provider Notes (Signed)
[facility] Emergency Department Provider Note   ____________________________________________   First MD Initiated Contact with Patient 04/18/20 [phone]     (approximate)  I have reviewed the triage vital signs and the nursing notes.   HISTORY  Chief Complaint Shortness of Breath    HPI Theresa [name] is a 64 y.o. female here for evaluation of shortness of breath  Patient reports she started having shortness of breath about 3 days ago and it is getting worse.  Last night she could not lay down to sleep because she had to sit up feeling short of breath.  No fevers chills or cough.  Denies any recent illness.  She is on rejection medication for kidney transplant managed by [facility]  No recent illnesses.  Denies any chest pain.  No leg swelling.  No unintended weight gain.  Denies any pain or discomfort except for having take some Tylenol this morning due to her typical aches and pains of her "bad arthritis"  Still urinating normally.  No pain or burning with urination.   Also takes Lasix for fluid management.  Past Medical History:  Diagnosis Date  . Anemia of chronic disease   . Anginal pain ([place])   . Carotid arterial disease ([place])    a. 02/2013 U/S: 40-59% bilat ICA stenosis.  . Chronic constipation   . Chronic diastolic CHF (congestive heart failure) ([place])    a. 10/2013 Echo [facility]): EF 55-60%, mod conc LVH, mod MR, mildly dil LA, mild Ao sclerosis w/o stenosis; b. 02/2016 Echo: EF 55-60%, no rwma, Gr DD, mild AS, mod to sev MR, mildly dil LA, PASP 35mmHg; c. 07/2017 Echo [facility]): EF >55%, mild to mod LVH, Gr2 DD, Ao scl, Mod MR, mod dil LA, nl RV fxn.  . Colon polyps   . COPD (chronic obstructive pulmonary disease) ([place])   . Coronary artery disease    a. 05/2013 NSTEMI/PCI: LM 20d, LAD min irregs, LCX small, nl, OM1 nl, RCA dom 59m (2.5x16 Promus DES), PDA1 80p; b. 04/2015 Cath: LM nl, LAD 40m, D1/2 min irregs, LCX 35p/m, OM2/3 min irregs, RCA patent  mid stent, RPDA 70ost, RPLB1 30, RPLB2/3 min irregs, EF 55-65%--> Med Rx; c. 06/2017 [facility]): No ischemia/infarct, EF 64%.  . Diabetes mellitus   . Diabetic neuropathy ([place])   . Diabetic retinopathy ([place]) 05/28/2013   Hx bilat retinal detachment, proliferative diab retinopathy and bilat vitreous hemorrhage   . Emphysema   . ESRD on hemodialysis ([place])    a. [facility] in [place], [place] [phone]/Dr. [name], on a MWF schedule.  She started dialysis in Feb 2014.  Etiology of renal failure not known, likely diabetes.  Has a left upper arm AV graft.  . History of bronchitis    Mar 2012  . History of pneumonia    June 2012  . History of tobacco abuse    a. Quit 2012.  Marland Kitchen Hyperlipidemia   . Hypertension   . Moderate to severe mitral insufficiency    a. 10/2013 Echo: EF 55-60%, mod MR; b. 02/2016 Echo: EF 55-60%, mod to sev MR directed posteriorly--felt to be dynamic -worse with volume overload; c. 07/2017 Echo [facility]): EF >55%. Mod MR.  . Myocardial infarct ([place]) 05/2013  . Peripheral vascular disease ([place])   . Sickle cell trait ([place])   . Thyroid nodule    [place] 06/2017 due to f/u US in 1 year     Patient Active Problem List   Diagnosis Date Noted  .  Chronic pain syndrome 02/27/2020  . Facet arthritis of cervical region 02/27/2020  . Lumbar radiculopathy 02/27/2020  . Hypercalcemia 02/27/2020  . Aortic atherosclerosis ([place]) 02/24/2020  . Bilateral leg weakness 01/06/2020  . Bilateral leg and foot pain 01/06/2020  . Numbness and tingling of both legs 01/06/2020  . Ulcer of esophagus without bleeding 06/20/2019  . Physical deconditioning 06/20/2019  . Vitamin D deficiency 06/20/2019  . Kidney transplanted 03/30/2019  . Immunocompromised patient ([place]) 03/30/2019  . Chronic illness 03/14/2019  . Dementia ([place]) 02/28/2019  . Abnormal MRI, lumbar spine 02/28/2019  . Pain due to onychomycosis of toenails of both feet 11/18/2018  . Tremor 09/23/2018  . Allergic rhinitis 09/23/2018  . Memory  loss 05/25/2018  . Valvular heart disease 01/05/2018  . History of heart attack 01/05/2018  . Chronic back pain 01/05/2018  . Elevated alkaline phosphatase level 12/29/2017  . Thyroid nodule 08/11/2017  . CHF (congestive heart failure) ([place]) 07/26/2017  . PAD (peripheral artery disease) ([place]) 03/07/2017  . Bilateral carotid artery stenosis 02/17/2017  . Complication of vascular access for dialysis 06/19/2016  . Atypical chest pain 05/21/2016  . Mitral regurgitation 02/06/2016  . OSA on CPAP 12/20/2015  . Screening for breast cancer 07/27/2015  . Dysuria 07/27/2015  . Chronic diastolic heart failure ([place]) 05/07/2015  . S/P coronary artery stent placement   . Leg pain 03/05/2015  . Neuritis or radiculitis due to rupture of lumbar intervertebral disc 11/28/2014  . Routine general medical examination at a health care facility 09/02/2013  . Osteoarthritis of right hip 08/23/2013  . DDD (degenerative disc disease), lumbosacral 08/23/2013  . Right hip pain 08/08/2013  . Coronary artery disease of native artery of native heart with stable angina pectoris ([place]) 06/27/2013  . Diabetic retinopathy ([place]) 05/28/2013  . Hyperlipidemia   . Syncope 01/21/2013  . End stage renal disease ([place]) 11/08/2012  . Essential hypertension 11/08/2012  . Diabetic neuropathy ([place]) 02/18/2012  . Anemia in chronic kidney disease (CKD) 02/18/2012  . ESRD on hemodialysis ([place]) 08/21/2011  . Diabetes mellitus, type II ([place]) 06/30/2011  . COPD (chronic obstructive pulmonary disease) ([place]) 06/13/2011    Past Surgical History:  Procedure Laterality Date  . A/V FISTULAGRAM Left 11/10/2017   Procedure: A/V FISTULAGRAM;  Surgeon: Katha Cabal, MD;  Location: [place] [facility];  Service: Cardiovascular;  Laterality: Left;  . A/V SHUNTOGRAM Left 02/08/2019   Procedure: A/V SHUNTOGRAM;  Surgeon: Katha Cabal, MD;  Location: [place] [facility];  Service: Cardiovascular;  Laterality: Left;  . ABDOMINAL  HYSTERECTOMY     2000  . CARDIAC CATHETERIZATION    . CARDIAC CATHETERIZATION N/A 05/02/2015   Procedure: Left Heart Cath and Coronary Angiography;  Surgeon: [name], MD;  Location: [place] [facility];  Service: Cardiovascular;  Laterality: N/A;  . COLONOSCOPY WITH PROPOFOL N/A 07/08/2016   Procedure: COLONOSCOPY WITH PROPOFOL;  Surgeon: [name], MD;  Location: [facility] ENDOSCOPY;  Service: Endoscopy;  Laterality: N/A;  . COLONOSCOPY WITH PROPOFOL N/A 08/20/2017   Procedure: COLONOSCOPY WITH PROPOFOL;  Surgeon: [name], MD;  Location: [facility] ENDOSCOPY;  Service: Gastroenterology;  Laterality: N/A;  . colonscopy    . CORONARY ANGIOPLASTY  05/28/2014   stent placement to the mid RCA  . [place] [facility] with stent placement 11 or 05/2019 and stent removal 06/13/19   . DILATION AND CURETTAGE OF UTERUS     several in the early 80's  . ESOPHAGOGASTRODUODENOSCOPY  2012  . ESOPHAGOGASTRODUODENOSCOPY (EGD) WITH PROPOFOL N/A 03/11/2018   Procedure: ESOPHAGOGASTRODUODENOSCOPY (EGD) WITH PROPOFOL;  Surgeon: [name], MD;  Location: [facility] ENDOSCOPY;  Service: Gastroenterology;  Laterality: N/A;  . EYE SURGERY     bilateral laser 2012  . EYE SURGERY     right  . EYE SURGERY     x4 both eyes  . GAS INSERTION  09/30/2011   Procedure: INSERTION OF GAS;  Surgeon: [name], MD;  Location: [place];  Service: Ophthalmology;  Laterality: Right;  [id number]  . GAS/FLUID EXCHANGE  09/30/2011   Procedure: GAS/FLUID EXCHANGE;  Surgeon: [name], MD;  Location: [place];  Service: Ophthalmology;  Laterality: Right;  . KIDNEY TRANSPLANT     03/30/19 Dr. Mickel Baas Thomas/Dr. Cristie Hem Zendel/Dr. Gilford Raid; [facility] transplant Dr. Horald Chestnut will be primary [phone] coordinator Sarah Wynkoop [phone]  . KIDNEY TRANSPLANT     03/30/19 [facility]  . LEFT HEART CATHETERIZATION WITH CORONARY ANGIOGRAM N/A 05/28/2013   Procedure: LEFT HEART CATHETERIZATION WITH CORONARY ANGIOGRAM;  Surgeon:  [name], MD;  Location: [facility] CATH LAB;  Service: Cardiovascular;  Laterality: N/A;  . PARS PLANA VITRECTOMY  04/22/2011   Procedure: PARS PLANA VITRECTOMY WITH 25 GAUGE;  Surgeon: [name], MD;  Location: [place];  Service: Ophthalmology;  Laterality: Left;  membrane peel, endolaser, gas fluid exchange, silicone oil, repair of complex traction retinal detachment  . PARS PLANA VITRECTOMY  09/30/2011   Procedure: PARS PLANA VITRECTOMY WITH 25 GAUGE;  Surgeon: [name], MD;  Location: [place];  Service: Ophthalmology;  Laterality: Right;  Endolaser; Repair of Complex Traction Retinal Detachment  . PARS PLANA VITRECTOMY  02/24/2012   Procedure: PARS PLANA VITRECTOMY WITH 25 GAUGE;  Surgeon: [name], MD;  Location: [place];  Service: Ophthalmology;  Laterality: Left;  . PTCA    . SILICON OIL REMOVAL  7/67/3419   Procedure: SILICON OIL REMOVAL;  Surgeon: [name], MD;  Location: [place];  Service: Ophthalmology;  Laterality: Left;  . THROMBECTOMY / ARTERIOVENOUS GRAFT REVISION    . TUBAL LIGATION     1979  . UPPER EXTREMITY ANGIOGRAPHY Left 02/08/2019   Procedure: UPPER EXTREMITY ANGIOGRAPHY;  Surgeon: Katha Cabal, MD;  Location: [place] [facility];  Service: Cardiovascular;  Laterality: Left;    Prior to Admission medications   Medication Sig Start Date End Date Taking? Authorizing Provider  albuterol (PROVENTIL) (2.5 MG/3ML) 0.083% nebulizer solution Take 3 mLs (2.5 mg total) by nebulization every 6 (six) hours as needed for wheezing or shortness of breath. 09/23/18   McLean-Scocuzza, Nino Glow, MD  albuterol (VENTOLIN HFA) 108 (90 Base) MCG/ACT inhaler Inhale 1-2 puffs into the lungs every 6 (six) hours as needed for wheezing or shortness of breath. 09/23/18   McLean-Scocuzza, Nino Glow, MD  aspirin EC 81 MG tablet Take 1 tablet (81 mg total) by mouth at bedtime. 12/02/18   Theora Gianotti, NP  atorvastatin (LIPITOR) 40 MG tablet Take 40 mg by mouth daily at  6 PM.    [provider]  carvedilol (COREG) 12.5 MG tablet Take 1 tablet (12.5 mg total) by mouth 2 (two) times daily with a meal. 02/24/20   McLean-Scocuzza, Nino Glow, MD  cetirizine (ZYRTEC) 5 MG tablet Take 1 tablet (5 mg total) by mouth daily as needed for allergies. Patient not taking: Reported on 02/24/2020 09/23/18   McLean-Scocuzza, Nino Glow, MD  magnesium oxide (MAG-OX) 400 MG tablet  Take by mouth. 06/24/19 06/23/20  [provider]  metFORMIN (GLUCOPHAGE) 500 MG tablet Take by mouth 2 (two) times daily with a meal.    [provider]  metoCLOPramide (REGLAN) 5 MG tablet Take by mouth.  Patient not taking: Reported on 02/24/2020 08/26/19 08/25/20  [provider]  mycophenolate (MYFORTIC) 360 MG TBEC EC tablet Take 360 mg by mouth 2 (two) times daily. Patient not taking: Reported on 02/24/2020    [provider]  nitroGLYCERIN (NITROSTAT) 0.4 MG SL tablet Place 1 tablet (0.4 mg total) under the tongue every 5 (five) minutes as needed for chest pain. 12/02/18   Theora Gianotti, NP  oxyCODONE-acetaminophen (PERCOCET) 5-325 MG tablet Take 1 tablet by mouth 2 (two) times daily as needed for severe pain. 02/24/20   McLean-Scocuzza, Nino Glow, MD  pregabalin (LYRICA) 75 MG capsule Take 1 capsule (75 mg total) by mouth 2 (two) times daily. 06/20/19   McLean-Scocuzza, Nino Glow, MD  Tacrolimus ER (ENVARSUS XR) 1 MG TB24 Take 2 tablets by mouth every morning. Patient not taking: Reported on 02/24/2020    [provider]  Tacrolimus ER 4 MG TB24 Take 2 tablets by mouth every morning. Total dose 14 mg daily in am    [provider]  UNABLE TO FIND TEST BLOOD SUGARS 2 TIMES DAILY 09/26/13   [provider]  valGANciclovir (VALCYTE) 450 MG tablet Take 450 mg by mouth daily. Patient not taking: Reported on 02/24/2020    [provider]    Allergies Hydrocodone, Metformin, and Lisinopril  Family History  Problem Relation Age of  Onset  . Stroke Mother   . Heart attack Mother   . Heart disease Mother   . Colon cancer Father   . Colon cancer Sister   . Heart attack Brother   . Stroke Brother   . Diabetes Brother   . Breast cancer Sister   . Diabetes Brother   . Diabetes Daughter   . Renal Disease Daughter        on HD  . Anesthesia problems Neg Hx   . Hypotension Neg Hx   . Malignant hyperthermia Neg Hx   . Pseudochol deficiency Neg Hx     Social History Social History   Tobacco Use  . Smoking status: Former Smoker    Packs/day: 1.00    Years: 25.00    Pack years: 25.00    Quit date: 06/09/2010    Years since quitting: 9.8  . Smokeless tobacco: Never Used  Vaping Use  . Vaping Use: Never used  Substance Use Topics  . Alcohol use: No  . Drug use: No    Review of Systems Constitutional: No fever/chills Eyes: No visual changes. ENT: No sore throat. Cardiovascular: Denies chest pain. Respiratory: Denies shortness of breath. Gastrointestinal: No abdominal pain.   Genitourinary: Negative for dysuria. Musculoskeletal: Negative for back pain. Skin: Negative for rash. Neurological: Negative for headaches, areas of focal weakness or numbness.    ____________________________________________   PHYSICAL EXAM:  VITAL SIGNS: ED Triage Vitals  Enc Vitals Group     BP 04/18/20 0759 (!) 166/97     Pulse --      Resp 04/18/20 0806 (!) 29     Temp 04/18/20 0809 97.7 F (36.5 C)     Temp Source 04/18/20 0809 Oral     SpO2 --      Weight 04/18/20 0801 153 lb (69.4 kg)     Height 04/18/20 0801 5' 7.5" (1.715  m)     Head Circumference --      Peak Flow --      Pain Score 04/18/20 0803 9     Pain Loc --      Pain Edu? --      Excl. in [place]? --     Constitutional: Alert and oriented. Well appearing and in no acute distress.  She is sitting upright though just slightly tachypneic. Eyes: Conjunctivae are normal. Head: Atraumatic. Nose: No congestion/rhinnorhea. Mouth/Throat: Mucous membranes  are moist. Neck: No stridor.  Cardiovascular: Normal rate, regular rhythm. Grossly normal heart sounds.  Good peripheral circulation. Respiratory: Mild tachypnea but no significant distress.  Lung sounds slightly diminished in the bases.  No rales or crackles.  No wheezing. Gastrointestinal: Soft and nontender. No distention. Musculoskeletal: No lower extremity tenderness nor edema. Neurologic:  Normal speech and language. No gross focal neurologic deficits are appreciated.  Skin:  Skin is warm, dry and intact. No rash noted. Psychiatric: Mood and affect are normal. Speech and behavior are normal.  ____________________________________________   LABS (all labs ordered are listed, but only abnormal results are displayed)  Labs Reviewed  BRAIN NATRIURETIC PEPTIDE - Abnormal; Notable for the following components:      Result Value   B Natriuretic Peptide 657.1 (*)    All other components within normal limits  CBC - Abnormal; Notable for the following components:   RBC 2.88 (*)    Hemoglobin 9.1 (*)    HCT 27.8 (*)    nRBC 0.3 (*)    All other components within normal limits  COMPREHENSIVE METABOLIC PANEL - Abnormal; Notable for the following components:   Potassium 5.6 (*)    Glucose, Bld 124 (*)    Creatinine, Ser 1.37 (*)    AST 14 (*)    GFR, Estimated 44 (*)    All other components within normal limits  RESPIRATORY PANEL BY RT PCR (FLU A&B, COVID)  LIPASE, BLOOD  TROPONIN I (HIGH SENSITIVITY)   ____________________________________________  EKG  Reviewed interpreted 820 Heart rate 79 QRS 90 QTc 440 Slight baseline artifact, mild repolarization-like abnormality noted in inferior leads, due to patient's respiratory variation somewhat hard to get a good baseline.  She denies chest pain.  Suspect underlying LVH.  No obvious STEMI in the setting of not having any active chest pain and symptoms for 3 days, I find this unlikely to represent acute MI but will await troponin  testing ____________________________________________  RADIOLOGY  DG Chest 2 View  Result Date: 04/18/2020 CLINICAL DATA:  Shortness of breath EXAM: CHEST - 2 VIEW COMPARISON:  February 23, 2018 FINDINGS: There is a small right pleural effusion. There is no appreciable edema or airspace opacity. Heart is upper normal in size with pulmonary vascularity normal. There is aortic atherosclerosis. There is a subclavian region stent on the left. No bone lesions. IMPRESSION: Small right pleural effusion. No edema or airspace opacity. Heart upper normal in size. Aortic Atherosclerosis (ICD10-I70.0). Electronically Signed   By: Lowella Grip III M.D.   On: 04/18/2020 08:39     Chest x-ray reviewed, notable for small to moderate right-sided pleural effusion viewed by me. ____________________________________________   PROCEDURES  Procedure(s) performed: None  Procedures  Critical Care performed: No  ____________________________________________   INITIAL IMPRESSION / ASSESSMENT AND PLAN / ED COURSE  Pertinent labs & imaging results that were available during my care of the patient were reviewed by me and considered in my medical decision making (see chart for details).  Patient presents for dyspnea. Seems to be fairly positional in nature, associated with orthopnea. Chest x-ray reviewed notable for right-sided pleural effusion. Also hypertensive on presentation. Patient has taken her home 9 AM medications including her antirejection medication. We will continue to monitor, given her associated dyspnea, that improved with oxygen utilization, and right-sided pleural effusion hypertension and history of renal transplant have ordered a consult from Dr. Candiss Norse of nephrology. He is seeing and evaluating, also discussed the case with hospitalist, Dr. Francine Graven who will admit for further care and work-up given the associated dyspnea and pleural effusion. Did discuss with the patient potentially  considering transfer to [facility] but patient reports she would rather stay here if we can manage it, and at this point I do not see compelling reason to transfer to [facility].  No evidence of acute ACS to noted at this time. No signs or symptoms suggest acute pulmonary embolism. Very hesitant to provide any IV contrast given her transplant history as well  ----------------------------------------- 10:55 AM on 04/18/2020 -----------------------------------------  Dr. Candiss Norse and nephrology seeing patient in the ER at this time Patient admitted. Patient understanding agreeable with plan. Does report achiness and arthritic-like pain but she has had chronically, discussed with her and she reports good use of Ultram in the past      ____________________________________________   FINAL CLINICAL IMPRESSION(S) / ED DIAGNOSES  Final diagnoses:  Secondary hypertension  Pleural effusion on right  Dyspnea, unspecified type        Note:  This document was prepared using Dragon voice recognition software and may include unintentional dictation errors       Delman Kitten, MD 04/18/20 1055

## 2020-04-18 NOTE — Consult Note (Signed)
Cardiology Consultation:   Patient ID: Theresa [name]; [id number]; 09-04-1955   Admit date: 04/18/2020 Date of Consult: 04/18/2020  Primary Care Provider: McLean-Scocuzza, Nino Glow, MD Primary Cardiologist: Theresa [name] Primary Electrophysiologist:  None   Patient Profile:   Theresa [name] is a 64 y.o. female with a hx of CAD status post RCA stenting, moderate mitral regurgitation, HFpEF, ESRD presumed secondary to diabetic nephropathy previously on HD status post deceased donor renal transplant on 03/30/2019, COPD, PVD, carotid artery disease, DM2 with diabetic neuropathy and retinopathy, sickle cell trait, HTN, HLD, lumbar radiculopathy, thyroid nodule, esophageal stenosis with history of esophageal ulcers status post dilatation, and remote tobacco use who is being seen today for the evaluation of acute on chronic HFrEF at the request of Dr. Francine [name].  History of Present Illness:   [name] cardiac history dates back to 05/2013 at which time she was admitted for an NSTEMI with LHC at that time showing distal left main 20% stenosis, LAD with minimal irregularities, small LCx which was normal, OM1 normal, dominant RCA with 95% mid stenosis status post PCI/DES, proximal PDA 80% stenosis.  Repeat cath in 04/2015 showed left main normal, mid LAD 30% stenosis, D1/2 minimal irregularities, proximal to mid LCx 35% stenosis, OM 2 and 3 minimal irregularities, patent mid RCA stent, 70% ostial RPDA stenosis, 30% RPL B1 stenosis, R PLB 2/3 minimal irregularities, EF 55 to 65%.  Medical management recommended.  Most recent ischemic evaluation via Myoview in 06/2017 at [facility] showed no ischemia or infarct with an EF of 64%.  With regards to her mitral regurgitation, echo from 07/2017 at [facility] showed stable moderate mitral regurgitation and aortic valve sclerosis with normal LVSF.  Echo from 12/2018 showed an EF of 55 to 60%, mildly increased LV wall thickness, diastolic dysfunction, elevated mean left atrial pressure  with no evidence of RWMA, normal RV systolic function and ventricular cavity size with normal RV wall thickness, mildly dilated left atrium, mild to moderate mitral regurgitation.  She was last seen in the office in 10/2019 was doing well from a cardiac perspective.  Follow-up echo in 12/2019 to evaluate her mitral valve disease showed an EF of 60 to 65%, no regional wall motion normalities, moderate LVH, grade 2 diastolic dysfunction, normal RV systolic function and ventricular cavity size, mildly dilated left atrium, moderate mitral regurgitation, and mild to moderate aortic insufficiency.  This was stable when compared to her prior study.  She indicates since she has last been seen in our office she has been tapered off clonidine, discontinuing amlodipine, and tapering carvedilol down to 6.25 mg twice daily in the setting of BP remaining less than 673 mmHg systolic at home.  Most recent BP at her nephrology office earlier this month of 166/61.  She presented to [facility] ED on 11/10 with a 3-day history of worsening shortness of breath and orthopnea without associated lower extremity swelling, abdominal distention, PND, or early satiety.  She denies any dietary changes.  At baseline, she is not on a diuretic.  No significant weight gain.  No chest pain, palpitations, dizziness, presyncope, or syncope.  She does note a cough though this has been nonproductive.  Upon arrival to the ED she was noted to be significantly hypertensive with BP of 208/74.  Labs were notable for a BNP of 657, initial high-sensitivity troponin of 11 with delta troponin unchanged at 11, Hgb 9.1, potassium 5.6, serum creatinine 1.37.  Chest x-ray showed a small right pleural effusion with no  edema or airspace opacity.  CT chest without contrast showed bilateral pleural effusions and diffuse parenchymal edema consistent with CHF.  BP remains elevated in the 878M systolic.  In the ED Nitropaste was applied and she received 40 mg of IV Lasix.   Upon admission cardiology was asked to evaluate.  Currently she feels like her shortness of breath remains unchanged.  She denies any other symptoms or complaints.    Past Medical History:  Diagnosis Date  . Anemia of chronic disease   . Anginal pain ([place])   . Carotid arterial disease ([place])    a. 02/2013 U/S: 40-59% bilat ICA stenosis.  . Chronic constipation   . Chronic diastolic CHF (congestive heart failure) ([place])    a. 10/2013 Echo [facility]): EF 55-60%, mod conc LVH, mod MR, mildly dil LA, mild Ao sclerosis w/o stenosis; b. 02/2016 Echo: EF 55-60%, no rwma, Gr DD, mild AS, mod to sev MR, mildly dil LA, PASP 5mmHg; c. 07/2017 Echo [facility]): EF >55%, mild to mod LVH, Gr2 DD, Ao scl, Mod MR, mod dil LA, nl RV fxn.  . Colon polyps   . COPD (chronic obstructive pulmonary disease) ([place])   . Coronary artery disease    a. 05/2013 NSTEMI/PCI: LM 20d, LAD min irregs, LCX small, nl, OM1 nl, RCA dom 74m (2.5x16 Promus DES), PDA1 80p; b. 04/2015 Cath: LM nl, LAD 72m, D1/2 min irregs, LCX 35p/m, OM2/3 min irregs, RCA patent mid stent, RPDA 70ost, RPLB1 30, RPLB2/3 min irregs, EF 55-65%--> Med Rx; c. 06/2017 [facility]): No ischemia/infarct, EF 64%.  . Diabetes mellitus   . Diabetic neuropathy ([place])   . Diabetic retinopathy ([place]) 05/28/2013   Hx bilat retinal detachment, proliferative diab retinopathy and bilat vitreous hemorrhage   . Emphysema   . ESRD on hemodialysis ([place])    a. [facility] in [place], [place] ([phone]/Dr. [name], on a MWF schedule.  She started dialysis in Feb 2014.  Etiology of renal failure not known, likely diabetes.  Has a left upper arm AV graft.  . History of bronchitis    Mar 2012  . History of pneumonia    June 2012  . History of tobacco abuse    a. Quit 2012.  Mar[place] Kitchen Hyperlipidemia   . Hypertension   . Moderate to severe mitral insufficiency    a. 10/2013 Echo: EF 55-60%, mod MR; b. 02/2016 Echo: EF 55-60%, mod to sev MR directed posteriorly--felt to be dynamic -worse with volume  overload; c. 07/2017 Echo [facility]): EF >55%. Mod MR.  . Myocardial infarct ([place]) 05/2013  . Peripheral vascular disease ([place])   . Sickle cell trait ([place])   . Thyroid nodule    [place] 06/2017 due to f/u US in 1 year     Past Surgical History:  Procedure Laterality Date  . A/V FISTULAGRAM Left 11/10/2017   Procedure: A/V FISTULAGRAM;  Surgeon: Katha Cabal, MD;  Location: [place] CV LAB;  Service: Cardiovascular;  Laterality: Left;  . A/V SHUNTOGRAM Left 02/08/2019   Procedure: A/V SHUNTOGRAM;  Surgeon: Katha Cabal, MD;  Location: [place] CV LAB;  Service: Cardiovascular;  Laterality: Left;  . ABDOMINAL HYSTERECTOMY     2000  . CARDIAC CATHETERIZATION    . CARDIAC CATHETERIZATION N/A 05/02/2015   Procedure: Left Heart Cath and Coronary Angiography;  Surgeon: [name], MD;  Location: [place] CV LAB;  Service: Cardiovascular;  Laterality: N/A;  . COLONOSCOPY WITH PROPOFOL N/A 07/08/2016   Procedure: COLONOSCOPY WITH PROPOFOL;  Surgeon:  [name], MD;  Location: [facility] ENDOSCOPY;  Service: Endoscopy;  Laterality: N/A;  . COLONOSCOPY WITH PROPOFOL N/A 08/20/2017   Procedure: COLONOSCOPY WITH PROPOFOL;  Surgeon: [name], MD;  Location: [facility] ENDOSCOPY;  Service: Gastroenterology;  Laterality: N/A;  . colonscopy    . CORONARY ANGIOPLASTY  05/28/2014   stent placement to the mid RCA  . [place] [facility] with stent placement 11 or 05/2019 and stent removal 06/13/19   . DILATION AND CURETTAGE OF UTERUS     several in the early 80's  . ESOPHAGOGASTRODUODENOSCOPY     2012  . ESOPHAGOGASTRODUODENOSCOPY (EGD) WITH PROPOFOL N/A 03/11/2018   Procedure: ESOPHAGOGASTRODUODENOSCOPY (EGD) WITH PROPOFOL;  Surgeon: [name], MD;  Location: [facility] ENDOSCOPY;  Service: Gastroenterology;  Laterality: N/A;  . EYE SURGERY     bilateral laser 2012  . EYE SURGERY     right  . EYE SURGERY     x4 both eyes  . GAS INSERTION  09/30/2011   Procedure: INSERTION OF GAS;   Surgeon: [name], MD;  Location: [place];  Service: Ophthalmology;  Laterality: Right;  [id number]  . GAS/FLUID EXCHANGE  09/30/2011   Procedure: GAS/FLUID EXCHANGE;  Surgeon: [name], MD;  Location: [place];  Service: Ophthalmology;  Laterality: Right;  . KIDNEY TRANSPLANT     03/30/19 Dr. Mickel Baas Thomas/Dr. Cristie Hem Zendel/Dr. Gilford Raid; [facility] transplant Dr. Horald Chestnut will be primary [phone] coordinator Sarah Wynkoop [phone]  . KIDNEY TRANSPLANT     03/30/19 [facility]  . LEFT HEART CATHETERIZATION WITH CORONARY ANGIOGRAM N/A 05/28/2013   Procedure: LEFT HEART CATHETERIZATION WITH CORONARY ANGIOGRAM;  Surgeon: [name], MD;  Location: [facility] CATH LAB;  Service: Cardiovascular;  Laterality: N/A;  . PARS PLANA VITRECTOMY  04/22/2011   Procedure: PARS PLANA VITRECTOMY WITH 25 GAUGE;  Surgeon: [name], MD;  Location: [place];  Service: Ophthalmology;  Laterality: Left;  membrane peel, endolaser, gas fluid exchange, silicone oil, repair of complex traction retinal detachment  . PARS PLANA VITRECTOMY  09/30/2011   Procedure: PARS PLANA VITRECTOMY WITH 25 GAUGE;  Surgeon: [name], MD;  Location: [place];  Service: Ophthalmology;  Laterality: Right;  Endolaser; Repair of Complex Traction Retinal Detachment  . PARS PLANA VITRECTOMY  02/24/2012   Procedure: PARS PLANA VITRECTOMY WITH 25 GAUGE;  Surgeon: [name], MD;  Location: [place];  Service: Ophthalmology;  Laterality: Left;  . PTCA    . SILICON OIL REMOVAL  03/01/3006   Procedure: SILICON OIL REMOVAL;  Surgeon: [name], MD;  Location: [place];  Service: Ophthalmology;  Laterality: Left;  . THROMBECTOMY / ARTERIOVENOUS GRAFT REVISION    . TUBAL LIGATION     1979  . UPPER EXTREMITY ANGIOGRAPHY Left 02/08/2019   Procedure: UPPER EXTREMITY ANGIOGRAPHY;  Surgeon: Katha Cabal, MD;  Location: [place] CV LAB;  Service: Cardiovascular;  Laterality: Left;     Home Meds: Prior to Admission medications     Medication Sig Start Date End Date Taking? Authorizing Provider  acetaminophen (TYLENOL) 500 MG tablet Take 1,000 mg by mouth 2 (two) times daily. 03/31/19  Yes [provider]  aspirin EC 81 MG tablet Take 1 tablet (81 mg total) by mouth at bedtime. 12/02/18  Yes Theora Gianotti, NP  atorvastatin (LIPITOR) 20 MG tablet Take 20 mg by mouth daily. 04/16/20  Yes [provider]  carvedilol (COREG) 6.25 MG tablet Take 6.25 mg by mouth  2 (two) times daily. 04/16/20  Yes [provider]  ergocalciferol (VITAMIN D2) 1.25 MG (50000 UT) capsule Take 50,000 Units by mouth once a week. 05/27/19 05/26/20 Yes [provider]  esomeprazole (NEXIUM) 20 MG capsule Take 20 mg by mouth daily. 04/16/20  Yes [provider]  magnesium oxide (MAG-OX) 400 MG tablet Take 400 mg by mouth 2 (two) times daily.  06/24/19 06/23/20 Yes [provider]  pregabalin (LYRICA) 75 MG capsule Take 1 capsule (75 mg total) by mouth 2 (two) times daily. 06/20/19  Yes McLean-Scocuzza, Nino Glow, MD  sodium bicarbonate 650 MG tablet Take 650 mg by mouth 2 (two) times daily. 10/13/19 10/12/20 Yes [provider]  Tacrolimus ER 4 MG TB24 Take 8 mg by mouth every morning.    Yes [provider]  albuterol (PROVENTIL) (2.5 MG/3ML) 0.083% nebulizer solution Take 3 mLs (2.5 mg total) by nebulization every 6 (six) hours as needed for wheezing or shortness of breath. 09/23/18   McLean-Scocuzza, Nino Glow, MD  albuterol (VENTOLIN HFA) 108 (90 Base) MCG/ACT inhaler Inhale 1-2 puffs into the lungs every 6 (six) hours as needed for wheezing or shortness of breath. 09/23/18   McLean-Scocuzza, Nino Glow, MD  cetirizine (ZYRTEC) 5 MG tablet Take 1 tablet (5 mg total) by mouth daily as needed for allergies. 09/23/18   McLean-Scocuzza, Nino Glow, MD  mycophenolate (MYFORTIC) 360 MG TBEC EC tablet Take 360 mg by mouth 2 (two) times daily. Patient not taking: Reported on 02/24/2020    [provider]  nitroGLYCERIN (NITROSTAT) 0.4 MG SL tablet Place 1 tablet (0.4 mg total) under the tongue every 5 (five) minutes as needed for chest pain. 12/02/18   Theora Gianotti, NP  oxyCODONE-acetaminophen (PERCOCET) 5-325 MG tablet Take 1 tablet by mouth 2 (two) times daily as needed for severe pain. Patient not taking: Reported on 04/18/2020 02/24/20   McLean-Scocuzza, Nino Glow, MD  pramipexole (MIRAPEX) 0.125 MG tablet Take 0.125 mg by mouth 2 (two) times daily. Patient not taking: Reported on 04/18/2020 03/06/20   [provider]  UNABLE TO FIND TEST BLOOD SUGARS 2 TIMES DAILY 09/26/13   [provider]  valGANciclovir (VALCYTE) 450 MG tablet Take 450 mg by mouth daily. Patient not taking: Reported on 02/24/2020    [provider]    Inpatient Medications: Scheduled Meds: . aspirin EC  81 mg Oral QHS  . atorvastatin  40 mg Oral q1800  . carvedilol  12.5 mg Oral BID WC  . enoxaparin (LOVENOX) injection  40 mg Subcutaneous Q24H  . furosemide  40 mg Intravenous Daily  . insulin aspart  0-9 Units Subcutaneous TID WC  . magnesium oxide  400 mg Oral Daily  . nitroGLYCERIN  1 inch Topical Q6H  . pregabalin  75 mg Oral BID  . sodium chloride flush  3 mL Intravenous Q12H  . sodium zirconium cyclosilicate  10 g Oral Daily  . [START ON 04/19/2020] Tacrolimus ER  8 tablet Oral q morning - 10a   Continuous Infusions: . sodium chloride     PRN Meds: sodium chloride, acetaminophen, albuterol, nitroGLYCERIN, ondansetron (ZOFRAN) IV, oxyCODONE-acetaminophen, sodium chloride flush  Allergies:   Allergies  Allergen Reactions  . Hydrocodone Hives  . Metformin Diarrhea and Other (See Comments)    Other reaction(s): Distress (finding)  . Lisinopril     Unknown reaction     Social History:   Social History   Socioeconomic History  . Marital status: Single  Spouse name: Not on file  . Number of children: 2  . Years of education: Not on file  .  Highest education level: Not on file  Occupational History  . Not on file  Tobacco Use  . Smoking status: Former Smoker    Packs/day: 1.00    Years: 25.00    Pack years: 25.00    Quit date: 06/09/2010    Years since quitting: 9.8  . Smokeless tobacco: Never Used  Vaping Use  . Vaping Use: Never used  Substance and Sexual Activity  . Alcohol use: No  . Drug use: No  . Sexual activity: Never  Other Topics Concern  . Not on file  Social History Narrative   On disability.    Lives with son Brenton Grills   2 children (has son and daughter)      Son drives her since her vision has decreased   Social Determinants of Health   Financial Resource Strain:   . Difficulty of Paying Living Expenses: Not on file  Food Insecurity:   . Worried About Charity fundraiser in the Last Year: Not on file  . Ran Out of Food in the Last Year: Not on file  Transportation Needs:   . Lack of Transportation (Medical): Not on file  . Lack of Transportation (Non-Medical): Not on file  Physical Activity:   . Days of Exercise per Week: Not on file  . Minutes of Exercise per Session: Not on file  Stress:   . Feeling of Stress : Not on file  Social Connections:   . Frequency of Communication with Friends and Family: Not on file  . Frequency of Social Gatherings with Friends and Family: Not on file  . Attends Religious Services: Not on file  . Active Member of Clubs or Organizations: Not on file  . Attends Archivist Meetings: Not on file  . Marital Status: Not on file  Intimate Partner Violence:   . Fear of Current or Ex-Partner: Not on file  . Emotionally Abused: Not on file  . Physically Abused: Not on file  . Sexually Abused: Not on file     Family History:   Family History  Problem Relation Age of Onset  . Stroke Mother   . Heart attack Mother   . Heart disease Mother   . Colon cancer Father   . Colon cancer Sister   . Heart attack Brother   . Stroke Brother   . Diabetes Brother    . Breast cancer Sister   . Diabetes Brother   . Diabetes Daughter   . Renal Disease Daughter        on HD  . Anesthesia problems Neg Hx   . Hypotension Neg Hx   . Malignant hyperthermia Neg Hx   . Pseudochol deficiency Neg Hx     ROS:  Review of Systems  Constitutional: Positive for malaise/fatigue. Negative for chills, diaphoresis, fever and weight loss.  HENT: Negative for congestion.   Eyes: Negative for discharge and redness.  Respiratory: Positive for shortness of breath. Negative for cough, sputum production and wheezing.   Cardiovascular: Positive for orthopnea. Negative for chest pain, palpitations, claudication, leg swelling and PND.  Gastrointestinal: Negative for abdominal pain, heartburn, nausea and vomiting.  Musculoskeletal: Negative for falls and myalgias.  Skin: Negative for rash.  Neurological: Negative for dizziness, tingling, tremors, sensory change, speech change, focal weakness, loss of consciousness and weakness.  Endo/Heme/Allergies: Does not bruise/bleed easily.  Psychiatric/Behavioral: Negative for  substance abuse. The patient is not nervous/anxious.   All other systems reviewed and are negative.     Physical Exam/Data:   Vitals:   04/18/20 1430 04/18/20 1445 04/18/20 1500 04/18/20 1515  BP:   (!) 180/52   Pulse:      Resp: 19 (!) 7 (!) 21 (!) 22  Temp:      TempSrc:      SpO2:      Weight:      Height:       No intake or output data in the 24 hours ending 04/18/20 1542 Filed Weights   04/18/20 0801  Weight: 69.4 kg   Body mass index is 23.61 kg/m.   Physical Exam: General: Well developed, well nourished, in no acute distress. Head: Normocephalic, atraumatic, sclera non-icteric, no xanthomas, nares without discharge.  Neck: Negative for carotid bruits. JVD not elevated. Lungs: Diminished breath sounds along the bilateral bases. Breathing is unlabored. Heart: RRR with S1 S2. III/VI systolic murmur LSB, no rubs, or gallops  appreciated. Abdomen: Soft, non-tender, non-distended with normoactive bowel sounds. No hepatomegaly. No rebound/guarding. No obvious abdominal masses. Msk:  Strength and tone appear normal for age. Extremities: No clubbing or cyanosis. No edema. Distal pedal pulses are 2+ and equal bilaterally. Neuro: Alert and oriented X 3. No facial asymmetry. No focal deficit. Moves all extremities spontaneously. Psych:  Responds to questions appropriately with a normal affect.   EKG:  The EKG was personally reviewed and demonstrates: NSR, 68 bpm, LVH with early repolarization abnormalities, wandering along the inferior leads, no acute ST-T changes Telemetry:  Telemetry was personally reviewed and demonstrates: SR  Weights: [employer]   04/18/20 0801  Weight: 69.4 kg    Relevant CV Studies:  2D echo 12/2019: 1. Left ventricular ejection fraction, by estimation, is 60 to 65%. The  left ventricle has normal function. The left ventricle has no regional  wall motion abnormalities. There is moderate left ventricular hypertrophy.  Left ventricular diastolic  parameters are consistent with Grade II diastolic dysfunction  (pseudonormalization).  2. Right ventricular systolic function is normal. The right ventricular  size is normal.  3. Left atrial size was mildly dilated.  4. The mitral valve is normal in structure. Moderate mitral valve  regurgitation.  5. The aortic valve is normal in structure. Aortic valve regurgitation is  mild to moderate.  Laboratory Data:  Chemistry Recent Labs  Lab 04/18/20 0802  NA 136  K 5.6*  CL 107  CO2 22  GLUCOSE 124*  BUN 23  CREATININE 1.37*  CALCIUM 9.5  GFRNONAA 44*  ANIONGAP 7    Recent Labs  Lab 04/18/20 0802  PROT 6.8  ALBUMIN 3.5  AST 14*  ALT 9  ALKPHOS 98  BILITOT 1.0   Hematology Recent Labs  Lab 04/18/20 0802  WBC 5.9  RBC 2.88*  HGB 9.1*  HCT 27.8*  MCV 96.5  MCH 31.6  MCHC 32.7  RDW 15.0  PLT 182   Cardiac  EnzymesNo results for input(s): TROPONINI in the last 168 hours. No results for input(s): TROPIPOC in the last 168 hours.  BNP Recent Labs  Lab 04/18/20 0802  BNP 657.1*    DDimer No results for input(s): DDIMER in the last 168 hours.  Radiology/Studies:  DG Chest 2 View  Result Date: 04/18/2020 IMPRESSION: Small right pleural effusion. No edema or airspace opacity. Heart upper normal in size. Aortic Atherosclerosis (ICD10-I70.0). Electronically Signed   By: Lowella Grip III M.D.   On:  04/18/2020 08:39   CT CHEST WO CONTRAST  Result Date: 04/18/2020 IMPRESSION: Bilateral pleural effusions and diffuse parenchymal edema consistent with congestive failure. Some basilar atelectasis is noted as well. Aortic Atherosclerosis (ICD10-I70.0). Electronically Signed   By: Inez Catalina M.D.   On: 04/18/2020 11:33    Assessment and Plan:   1.  Acute on chronic HFrEF/pleural effusions: -Possibly exacerbated by accelerated hypertension -IV Lasix 40 mg daily with close monitoring of renal function -Update echo as outlined below given history of moderate mitral regurgitation -Daily weights -Strict I's and O's  2. CAD involving the native coronary arteries without angina: -No symptoms of chest pain -High-sensitivity troponin negative x2, no need to cycle further -ASA -Coreg -Lipitor -No plans for inpatient ischemic evaluation at this time given lack of anginal symptoms, nonacute EKG, and a negative high-sensitivity troponin x2  3. Moderate mitral regurgitation: -Possibly contributing to her presentation -Repeat echo, if there has been significant progression of her mitral valve disease she may need TEE  4. ESRD s/p renal transplant/hyperkalemia: -Per IM/nephrology  -Monitor with diuresis   5.  Accelerated hypertension: -Significantly hypertensive with systolic blood pressure greater than 200 upon arrival with BP currently 694 systolic -Diuresis as above -Starting higher dose  carvedilol 12.5 mg twice daily in the ED (PTA 6.25 mg twice daily) -Currently on Nitropaste which can likely be consolidated to long-acting oral nitrate over the next 24 hours  6.  HLD: -PTA atorvastatin   For questions or updates, please contact [place] Please consult [email or web address] for contact info under Cardiology/STEMI.   Signed, Christell Faith, PA-C [place] Pager: [phone] 04/18/2020, 3:42 PM

## 2020-04-18 NOTE — Progress Notes (Signed)
[place], [place] 04/18/20  Subjective:   Hospital day # 0 Patient known to our practice from last year when she was on dialysis.  She then got a kidney transplant in October 2020.  Since then she has been following up with transplant clinic.  She presents for 2 days of shortness of breath that started on Sunday.  It got worse on Monday and Tuesday.  She now presents to the hospital for not able to take a deep breath and shortness of breath with minimal exertion. In the ER she is noted to be hypertensive and requiring oxygen supplementation Outpatient notes suggest that patient has problems with orthostatic hypotension and her blood pressure medications have been slowly weaned.  Objective:  Vital signs in last 24 hours:  Temp:  [97.7 F (36.5 C)] 97.7 F (36.5 C) (11/10 0809) Resp:  [29] 29 (11/10 0806) BP: (166-182)/(56-97) 182/56 (11/10 0800) Weight:  [69.4 kg] 69.4 kg (11/10 0801)  Weight change:  Filed Weights   04/18/20 0801  Weight: 69.4 kg    Intake/Output:   No intake or output data in the 24 hours ending 04/18/20 1049  Physical Exam: General:  No acute distress, laying in the bed  HEENT  anicteric, moist oral mucous membrane  Pulm/lungs  normal breathing effort, + b/l fine crackles  CVS/Heart  regular rhythm, no rub or gallop  Abdomen:   Soft, nontender  Extremities:  No peripheral edema  Neurologic:  Alert, oriented, able to follow commands  Skin:  No acute rashes     Basic Metabolic Panel:  Recent Labs  Lab 04/18/20 0802  NA 136  K 5.6*  CL 107  CO2 22  GLUCOSE 124*  BUN 23  CREATININE 1.37*  CALCIUM 9.5     CBC: Recent Labs  Lab 04/18/20 0802  WBC 5.9  HGB 9.1*  HCT 27.8*  MCV 96.5  PLT 182      Lab Results  Component Value Date   HEPBSAG Negative 05/22/2016   HEPBSAB NONREACTIVE 11/12/2012      Microbiology:  Recent Results (from the past 240 hour(s))  Respiratory Panel by RT PCR (Flu A&B,  Covid) - Nasopharyngeal Swab     Status: None   Collection Time: 04/18/20  8:02 AM   Specimen: Nasopharyngeal Swab  Result Value Ref Range Status   SARS Coronavirus 2 by RT PCR NEGATIVE NEGATIVE Final    Comment: (NOTE) SARS-CoV-2 target nucleic acids are NOT DETECTED.  The SARS-CoV-2 RNA is generally detectable in upper respiratoy specimens during the acute phase of infection. The lowest concentration of SARS-CoV-2 viral copies this assay can detect is 131 copies/mL. A negative result does not preclude SARS-Cov-2 infection and should not be used as the sole basis for treatment or other patient management decisions. A negative result may occur with  improper specimen collection/handling, submission of specimen other than nasopharyngeal swab, presence of viral mutation(s) within the areas targeted by this assay, and inadequate number of viral copies (<131 copies/mL). A negative result must be combined with clinical observations, patient history, and epidemiological information. The expected result is Negative.  Fact Sheet for Patients:  PinkCheek.be  Fact Sheet for Healthcare Providers:  GravelBags.it  This test is no t yet approved or cleared by the [place] FDA and  has been authorized for detection and/or diagnosis of SARS-CoV-2 by FDA under an Emergency Use Authorization (EUA). This EUA will remain  in effect (meaning this test can be used) for the duration of  the COVID-19 declaration under Section 564(b)(1) of the Act, 21 U.S.C. section 360bbb-3(b)(1), unless the authorization is terminated or revoked sooner.     Influenza A by PCR NEGATIVE NEGATIVE Final   Influenza B by PCR NEGATIVE NEGATIVE Final    Comment: (NOTE) The Xpert Xpress SARS-CoV-2/FLU/RSV assay is intended as an aid in  the diagnosis of influenza from Nasopharyngeal swab specimens and  should not be used as a sole basis for treatment. Nasal  washings and  aspirates are unacceptable for Xpert Xpress SARS-CoV-2/FLU/RSV  testing.  Fact Sheet for Patients: PinkCheek.be  Fact Sheet for Healthcare Providers: GravelBags.it  This test is not yet approved or cleared by the [place] FDA and  has been authorized for detection and/or diagnosis of SARS-CoV-2 by  FDA under an Emergency Use Authorization (EUA). This EUA will remain  in effect (meaning this test can be used) for the duration of the  Covid-19 declaration under Section 564(b)(1) of the Act, 21  U.S.C. section 360bbb-3(b)(1), unless the authorization is  terminated or revoked. Performed at [facility], [address] [place], [place] [address]     Coagulation Studies: No results for input(s): LABPROT, INR in the last 72 hours.  Urinalysis: No results for input(s): COLORURINE, LABSPEC, PHURINE, GLUCOSEU, HGBUR, BILIRUBINUR, KETONESUR, PROTEINUR, UROBILINOGEN, NITRITE, LEUKOCYTESUR in the last 72 hours.  Invalid input(s): APPERANCEUR    Imaging: DG Chest 2 View  Result Date: 04/18/2020 CLINICAL DATA:  Shortness of breath EXAM: CHEST - 2 VIEW COMPARISON:  February 23, 2018 FINDINGS: There is a small right pleural effusion. There is no appreciable edema or airspace opacity. Heart is upper normal in size with pulmonary vascularity normal. There is aortic atherosclerosis. There is a subclavian region stent on the left. No bone lesions. IMPRESSION: Small right pleural effusion. No edema or airspace opacity. Heart upper normal in size. Aortic Atherosclerosis (ICD10-I70.0). Electronically Signed   By: Lowella Grip III M.D.   On: 04/18/2020 08:39     Medications:       Assessment/ Plan:  64 y.o. female with   diabetes type 2 with severe complications including diabetic retinopathy, nephropathy, long standing hypertension, COPD, hyperlipidemia, congestive heart failure coronary disease with a  history of non-STEMI and PCI 2014, Renal Transplant  03/2020, severe neuropathy, OSA in CPAP admitted on 04/18/2020 for CHF (congestive heart failure) ([facility]) [I50.9]  Renal Transplant Status S/p Deceased donor transplant on 2019/04/12 ESRD presumed secondary to DM Baseline Creatinine 1.4 (range 0.9-1.4)  Immunosuppression Tacrolimus (ENVARSUS XR) 4 mg Tb24 extended release tablet Take 2 tablets (8 mg total) by mouth every morning Last trough 6.8 on 02/01/2020 Myfortic on hold starting 09/12/19 due to leukopenia and diarrhea H/o post transplant UTI - proteus, citrobater Continue home immunosuppression meds  HTN With h/o postural hypotension and syncope - BP was 120's outpatient - carvedilol as outpatient  HFpEF EF 65-70 % from 03/2019; with mod to severe MR  Hyperkalemia We will start Lokelma daily  Shortness of breath with severe hypertension Shortness of breath is likely due to elevated blood pressure /volume overload and history of mitral regurgitation leading to pulmonary edema We will give 1 dose of IV Lasix and nitro glycerin ointment Continue to monitor closely       LOS: 0 Laina Guerrieri Candiss Norse 11/10/202110:49 AM  [place], [place]  Note: This note was prepared with Dragon dictation. Any transcription errors are unintentional

## 2020-04-18 NOTE — ED Notes (Signed)
Report called to jamie rn floor nurse 

## 2020-04-18 NOTE — H&P (Addendum)
History and Physical    Theresa [name] OVZ:[id number] DOB: 04/19/1956 DOA: 04/18/2020  PCP: McLean-Scocuzza, Nino Glow, MD   Patient coming from: Home  I have personally briefly reviewed patient's old medical records in [place]  Chief Complaint: Shortness of breath  HPI: Theresa [name] is a 64 y.o. female with medical history significant for chronic diastolic dysfunction CHF, status post recent renal transplant, diabetes mellitus with complications of stage III chronic kidney disease, hypertension who presents to the ER for evaluation of a 3-day history of shortness of breath which has progressively worsened over the last couple of days associated with orthopnea.  Patient states that she had to sit up to be able to sleep because she could not lay down due to shortness of breath.   She has no lower extremity swelling.  She denies having any cough, no fever, no chills, no chest pain, no nausea, no vomiting, no diaphoresis, no palpitation, no abdominal pain, no changes in her bowel habits or any urinary symptoms. She was noted to have significantly elevated blood pressure upon arrival to the ER, 208/74 Labs show sodium 136, potassium 5.6, chloride 107, bicarb 20, glucose 124, BUN 23, creatinine 1.37, calcium 9.5, alkaline phosphatase 98, albumin 3.5, lipase 19, AST 14, ALT 9, total protein 6.8, BNP 657, troponin XI, white count 5.9, hemoglobin 9.1, hematocrit 27.8, MCV 96.5, RDW 15, platelet count 182 Respiratory viral panel is negative CT scan of the chest without contrast shows bilateral pleural effusions and diffuse parenchymal edema consistent with CHF.  Some basilar atelectasis is noted as well. Chest x-ray reviewed by me shows a small right pleural effusion Twelve-lead EKG reviewed by me shows sinus rhythm, LVH and nonspecific ST changes involving the inferior leads.    ED Course: Patient is a 64 year old African-American female who presents to the ER for evaluation of a 3-day history  of worsening shortness of breath associated with orthopnea.  Patient's blood pressure was significantly elevated upon arrival to the ER.  Imaging is consistent with CHF and BNP is elevated.  She will be admitted to the hospital for further evaluation and treatment.  Review of Systems: As per HPI otherwise 10 point review of systems negative.    Past Medical History:  Diagnosis Date  . Anemia of chronic disease   . Anginal pain ([place])   . Carotid arterial disease ([place])    a. 02/2013 U/S: 40-59% bilat ICA stenosis.  . Chronic constipation   . Chronic diastolic CHF (congestive heart failure) ([place])    a. 10/2013 Echo [facility]): EF 55-60%, mod conc LVH, mod MR, mildly dil LA, mild Ao sclerosis w/o stenosis; b. 02/2016 Echo: EF 55-60%, no rwma, Gr DD, mild AS, mod to sev MR, mildly dil LA, PASP 67mmHg; c. 07/2017 Echo [facility]): EF >55%, mild to mod LVH, Gr2 DD, Ao scl, Mod MR, mod dil LA, nl RV fxn.  . Colon polyps   . COPD (chronic obstructive pulmonary disease) ([place])   . Coronary artery disease    a. 05/2013 NSTEMI/PCI: LM 20d, LAD min irregs, LCX small, nl, OM1 nl, RCA dom 64m (2.5x16 Promus DES), PDA1 80p; b. 04/2015 Cath: LM nl, LAD 22m, D1/2 min irregs, LCX 35p/m, OM2/3 min irregs, RCA patent mid stent, RPDA 70ost, RPLB1 30, RPLB2/3 min irregs, EF 55-65%--> Med Rx; c. 06/2017 MV [facility]): No ischemia/infarct, EF 64%.  . Diabetes mellitus   . Diabetic neuropathy ([place])   . Diabetic retinopathy ([place]) 05/28/2013   Hx bilat retinal detachment,  proliferative diab retinopathy and bilat vitreous hemorrhage   . Emphysema   . ESRD on hemodialysis ([place])    a. [facility] in [place], [place] [phone]/Dr. [name], on a MWF schedule.  She started dialysis in Feb 2014.  Etiology of renal failure not known, likely diabetes.  Has a left upper arm AV graft.  . History of bronchitis    Mar 2012  . History of pneumonia    June 2012  . History of tobacco abuse    a. Quit 2012.  Marland Kitchen Hyperlipidemia   . Hypertension   .  Moderate to severe mitral insufficiency    a. 10/2013 Echo: EF 55-60%, mod MR; b. 02/2016 Echo: EF 55-60%, mod to sev MR directed posteriorly--felt to be dynamic -worse with volume overload; c. 07/2017 Echo [facility]): EF >55%. Mod MR.  . Myocardial infarct ([place]) 05/2013  . Peripheral vascular disease ([place])   . Sickle cell trait ([place])   . Thyroid nodule    [place] 06/2017 due to f/u US in 1 year     Past Surgical History:  Procedure Laterality Date  . A/V FISTULAGRAM Left 11/10/2017   Procedure: A/V FISTULAGRAM;  Surgeon: Katha Cabal, MD;  Location: [facility];  Service: Cardiovascular;  Laterality: Left;  . A/V SHUNTOGRAM Left 02/08/2019   Procedure: A/V SHUNTOGRAM;  Surgeon: Katha Cabal, MD;  Location: [place] [facility];  Service: Cardiovascular;  Laterality: Left;  . ABDOMINAL HYSTERECTOMY     2000  . CARDIAC CATHETERIZATION    . CARDIAC CATHETERIZATION N/A 05/02/2015   Procedure: Left Heart Cath and Coronary Angiography;  Surgeon: [name], MD;  Location: [facility] [facility];  Service: Cardiovascular;  Laterality: N/A;  . COLONOSCOPY WITH PROPOFOL N/A 07/08/2016   Procedure: COLONOSCOPY WITH PROPOFOL;  Surgeon: [name], MD;  Location: [facility] ENDOSCOPY;  Service: Endoscopy;  Laterality: N/A;  . COLONOSCOPY WITH PROPOFOL N/A 08/20/2017   Procedure: COLONOSCOPY WITH PROPOFOL;  Surgeon: [name], MD;  Location: [facility] ENDOSCOPY;  Service: Gastroenterology;  Laterality: N/A;  . colonscopy    . CORONARY ANGIOPLASTY  05/28/2014   stent placement to the mid RCA  . [facility] with stent placement 11 or 05/2019 and stent removal 06/13/19   . DILATION AND CURETTAGE OF UTERUS     several in the early 80's  . ESOPHAGOGASTRODUODENOSCOPY     2012  . ESOPHAGOGASTRODUODENOSCOPY (EGD) WITH PROPOFOL N/A 03/11/2018   Procedure: ESOPHAGOGASTRODUODENOSCOPY (EGD) WITH PROPOFOL;  Surgeon: [name], MD;  Location: [facility]COPY;  Service: Gastroenterology;   Laterality: N/A;  . EYE SURGERY     bilateral laser 2012  . EYE SURGERY     right  . EYE SURGERY     x4 both eyes  . GAS INSERTION  09/30/2011   Procedure: INSERTION OF GAS;  Surgeon: [name], MD;  Location: [facility] [facility];  Service: Ophthalmology;  Laterality: Right;  [id number]  . GAS/FLUID EXCHANGE  09/30/2011   Procedure: GAS/FLUID EXCHANGE;  Surgeon: [name], MD;  Location: [facility];  Service: Ophthalmology;  Laterality: Right;  . KIDNEY TRANSPLANT     03/30/19 Dr. Mickel Baas Thomas/Dr. Cristie Hem Zendel/Dr. Gilford Raid; [facility] transplant Dr. Horald Chestnut will be primary [phone] coordinator Sarah Wynkoop [phone]  . KIDNEY TRANSPLANT     03/30/19 [facility]  . LEFT HEART CATHETERIZATION WITH CORONARY ANGIOGRAM N/A 05/28/2013   Procedure: LEFT HEART CATHETERIZATION WITH CORONARY ANGIOGRAM;  Surgeon: [name], MD;  Location: [facility];  Service: Cardiovascular;  Laterality: N/A;  . PARS PLANA VITRECTOMY  04/22/2011   Procedure: PARS PLANA VITRECTOMY WITH 25 GAUGE;  Surgeon: [name], MD;  Location: [facility];  Service: Ophthalmology;  Laterality: Left;  membrane peel, endolaser, gas fluid exchange, silicone oil, repair of complex traction retinal detachment  . PARS PLANA VITRECTOMY  09/30/2011   Procedure: PARS PLANA VITRECTOMY WITH 25 GAUGE;  Surgeon: [name], MD;  Location: [facility];  Service: Ophthalmology;  Laterality: Right;  Endolaser; Repair of Complex Traction Retinal Detachment  . PARS PLANA VITRECTOMY  02/24/2012   Procedure: PARS PLANA VITRECTOMY WITH 25 GAUGE;  Surgeon: [name], MD;  Location: [facility];  Service: Ophthalmology;  Laterality: Left;  . PTCA    . SILICON OIL REMOVAL  0/16/0109   Procedure: SILICON OIL REMOVAL;  Surgeon: [name], MD;  Location: [facility];  Service: Ophthalmology;  Laterality: Left;  . THROMBECTOMY / ARTERIOVENOUS GRAFT REVISION    . TUBAL LIGATION     1979  . UPPER EXTREMITY ANGIOGRAPHY Left 02/08/2019   Procedure: UPPER EXTREMITY  ANGIOGRAPHY;  Surgeon: Katha Cabal, MD;  Location: [facility];  Service: Cardiovascular;  Laterality: Left;     reports that she quit smoking about 9 years ago. She has a 25.00 pack-year smoking history. She has never used smokeless tobacco. She reports that she does not drink alcohol and does not use drugs.  Allergies  Allergen Reactions  . Hydrocodone Hives  . Metformin Diarrhea and Other (See Comments)    Other reaction(s): Distress (finding)  . Lisinopril     Unknown reaction     Family History  Problem Relation Age of Onset  . Stroke Mother   . Heart attack Mother   . Heart disease Mother   . Colon cancer Father   . Colon cancer Sister   . Heart attack Brother   . Stroke Brother   . Diabetes Brother   . Breast cancer Sister   . Diabetes Brother   . Diabetes Daughter   . Renal Disease Daughter        on HD  . Anesthesia problems Neg Hx   . Hypotension Neg Hx   . Malignant hyperthermia Neg Hx   . Pseudochol deficiency Neg Hx      Prior to Admission medications   Medication Sig Start Date End Date Taking? Authorizing Provider  acetaminophen (TYLENOL) 500 MG tablet Take 1,000 mg by mouth 2 (two) times daily. 03/31/19  Yes [provider]  aspirin EC 81 MG tablet Take 1 tablet (81 mg total) by mouth at bedtime. 12/02/18  Yes Theora Gianotti, NP  atorvastatin (LIPITOR) 20 MG tablet Take 20 mg by mouth daily. 04/16/20  Yes [provider]  carvedilol (COREG) 6.25 MG tablet Take 6.25 mg by mouth 2 (two) times daily. 04/16/20  Yes [provider]  ergocalciferol (VITAMIN D2) 1.25 MG (50000 UT) capsule Take 50,000 Units by mouth once a week. 05/27/19 05/26/20 Yes [provider]  esomeprazole (NEXIUM) 20 MG capsule Take 20 mg by mouth daily. 04/16/20  Yes [provider]  magnesium oxide (MAG-OX) 400 MG tablet Take 400 mg by mouth 2 (two) times daily.  06/24/19 06/23/20 Yes [provider]  pregabalin  (LYRICA) 75 MG capsule Take 1 capsule (75 mg total) by mouth 2 (two) times daily. 06/20/19  Yes McLean-Scocuzza, Nino Glow, MD  sodium bicarbonate 650 MG tablet Take 650 mg  by mouth 2 (two) times daily. 10/13/19 10/12/20 Yes [provider]  Tacrolimus ER 4 MG TB24 Take 8 mg by mouth every morning.    Yes [provider]  albuterol (PROVENTIL) (2.5 MG/3ML) 0.083% nebulizer solution Take 3 mLs (2.5 mg total) by nebulization every 6 (six) hours as needed for wheezing or shortness of breath. 09/23/18   McLean-Scocuzza, Nino Glow, MD  albuterol (VENTOLIN HFA) 108 (90 Base) MCG/ACT inhaler Inhale 1-2 puffs into the lungs every 6 (six) hours as needed for wheezing or shortness of breath. 09/23/18   McLean-Scocuzza, Nino Glow, MD  cetirizine (ZYRTEC) 5 MG tablet Take 1 tablet (5 mg total) by mouth daily as needed for allergies. 09/23/18   McLean-Scocuzza, Nino Glow, MD  mycophenolate (MYFORTIC) 360 MG TBEC EC tablet Take 360 mg by mouth 2 (two) times daily. Patient not taking: Reported on 02/24/2020    [provider]  nitroGLYCERIN (NITROSTAT) 0.4 MG SL tablet Place 1 tablet (0.4 mg total) under the tongue every 5 (five) minutes as needed for chest pain. 12/02/18   Theora Gianotti, NP  oxyCODONE-acetaminophen (PERCOCET) 5-325 MG tablet Take 1 tablet by mouth 2 (two) times daily as needed for severe pain. Patient not taking: Reported on 04/18/2020 02/24/20   McLean-Scocuzza, Nino Glow, MD  pramipexole (MIRAPEX) 0.125 MG tablet Take 0.125 mg by mouth 2 (two) times daily. Patient not taking: Reported on 04/18/2020 03/06/20   [provider]  UNABLE TO FIND TEST BLOOD SUGARS 2 TIMES DAILY 09/26/13   [provider]  valGANciclovir (VALCYTE) 450 MG tablet Take 450 mg by mouth daily. Patient not taking: Reported on 02/24/2020    [provider]    Physical Exam: Vitals:   04/18/20 0806 04/18/20 0809 04/18/20 1105 04/18/20 1130  BP:   (!) 174/46 (!) 194/58  Resp: (!)  29  18 (!) 30  Temp:  97.7 F (36.5 C)    TempSrc:  Oral    Weight:      Height:         Vitals:   04/18/20 0806 04/18/20 0809 04/18/20 1105 04/18/20 1130  BP:   (!) 174/46 (!) 194/58  Resp: (!) 29  18 (!) 30  Temp:  97.7 F (36.5 C)    TempSrc:  Oral    Weight:      Height:        Constitutional: NAD, alert and oriented x 3.  Chronically ill-appearing Eyes: PERRL, lids and conjunctivae pallor ENMT: Mucous membranes are moist.  Neck: normal, supple, no masses, no thyromegaly Respiratory: Coarse breath sounds in all lung fields, faint wheezing. Normal respiratory effort. No accessory muscle use.  Cardiovascular: Regular rate and rhythm, no murmurs / rubs / gallops. No extremity edema. 2+ pedal pulses. No carotid bruits.  Abdomen: no tenderness, no masses palpated. No hepatosplenomegaly. Bowel sounds positive.  Musculoskeletal: no clubbing / cyanosis. No joint deformity upper and lower extremities.  Skin: no rashes, lesions, ulcers.  Neurologic: No gross focal neurologic deficit. Psychiatric: Normal mood and affect.   Labs on Admission: I have personally reviewed following labs and imaging studies  CBC: Recent Labs  Lab 04/18/20 0802  WBC 5.9  HGB 9.1*  HCT 27.8*  MCV 96.5  PLT 500   Basic Metabolic Panel: Recent Labs  Lab 04/18/20 0802  NA 136  K 5.6*  CL 107  CO2 22  GLUCOSE 124*  BUN 23  CREATININE 1.37*  CALCIUM 9.5   GFR: Estimated Creatinine Clearance: 42.2 mL/min (A) (  by C-G formula based on SCr of 1.37 mg/dL (H)). Liver Function Tests: Recent Labs  Lab 04/18/20 0802  AST 14*  ALT 9  ALKPHOS 98  BILITOT 1.0  PROT 6.8  ALBUMIN 3.5   Recent Labs  Lab 04/18/20 0802  LIPASE 19   No results for input(s): AMMONIA in the last 168 hours. Coagulation Profile: No results for input(s): INR, PROTIME in the last 168 hours. Cardiac Enzymes: No results for input(s): CKTOTAL, CKMB, CKMBINDEX, TROPONINI in the last 168 hours. BNP (last 3  results) No results for input(s): PROBNP in the last 8760 hours. HbA1C: No results for input(s): HGBA1C in the last 72 hours. CBG: No results for input(s): GLUCAP in the last 168 hours. Lipid Profile: No results for input(s): CHOL, HDL, LDLCALC, TRIG, CHOLHDL, LDLDIRECT in the last 72 hours. Thyroid Function Tests: No results for input(s): TSH, T4TOTAL, FREET4, T3FREE, THYROIDAB in the last 72 hours. Anemia Panel: No results for input(s): VITAMINB12, FOLATE, FERRITIN, TIBC, IRON, RETICCTPCT in the last 72 hours. Urine analysis:    Component Value Date/Time   COLORURINE Yellow 06/26/2012 2030   APPEARANCEUR Hazy 06/26/2012 2030   LABSPEC 1.010 06/26/2012 2030   PHURINE 8.0 06/26/2012 2030   GLUCOSEU 50 mg/dL 06/26/2012 2030   HGBUR Negative 06/26/2012 2030   [place] Negative 06/26/2012 2030   [employer] Negative 06/26/2012 2030   PROTEINUR >=500 06/26/2012 2030   NITRITE Negative 06/26/2012 2030   LEUKOCYTESUR Negative 06/26/2012 2030    Radiological Exams on Admission: DG Chest 2 View  Result Date: 04/18/2020 CLINICAL DATA:  Shortness of breath EXAM: CHEST - 2 VIEW COMPARISON:  February 23, 2018 FINDINGS: There is a small right pleural effusion. There is no appreciable edema or airspace opacity. Heart is upper normal in size with pulmonary vascularity normal. There is aortic atherosclerosis. There is a subclavian region stent on the left. No bone lesions. IMPRESSION: Small right pleural effusion. No edema or airspace opacity. Heart upper normal in size. Aortic Atherosclerosis (ICD10-I70.0). Electronically Signed   By: Lowella Grip III M.D.   On: 04/18/2020 08:39   CT CHEST WO CONTRAST  Result Date: 04/18/2020 CLINICAL DATA:  Shortness of breath EXAM: CT CHEST WITHOUT CONTRAST TECHNIQUE: Multidetector CT imaging of the chest was performed following the standard protocol without IV contrast. COMPARISON:  Chest x-ray from earlier in the same day. CT from 01/12/2018  FINDINGS: Cardiovascular: Limited due to lack of IV contrast. Aortic atherosclerotic calcifications are noted without aneurysmal dilatation. Heavy coronary calcifications are seen. No cardiac enlargement is noted. Left subclavian vein stent is noted. Mediastinum/Nodes: Thoracic inlet is within normal limits. No hilar or mediastinal adenopathy is noted. The esophagus as visualized is within normal limits. Lungs/Pleura: Bilateral moderate to large pleural effusions are noted greater than that expected from recent chest x-ray. Bibasilar atelectatic changes are seen. Some mild generalized edema is noted within the lungs bilaterally consistent with congestive failure. No sizable parenchymal nodule is noted. Upper Abdomen: Visualized upper abdomen is within normal limits. The kidneys are somewhat shrunken consistent with end-stage renal disease. Musculoskeletal: No chest wall mass or suspicious bone lesions identified. IMPRESSION: Bilateral pleural effusions and diffuse parenchymal edema consistent with congestive failure. Some basilar atelectasis is noted as well. Aortic Atherosclerosis (ICD10-I70.0). Electronically Signed   By: Inez Catalina M.D.   On: 04/18/2020 11:33    EKG: Independently reviewed.  Normal sinus rhythm, LVH  Assessment/Plan Principal Problem:   Acute on chronic diastolic CHF (congestive heart failure) (HCC) Active Problems:  Diabetes mellitus, type II ([place])   Chronic kidney disease (CKD), stage III (moderate) (HCC)   Anemia in chronic kidney disease (CKD)   Kidney transplanted   Hyperkalemia   Hypertensive urgency     Acute on chronic diastolic dysfunction CHF Secondary to hypertensive urgency Last known LVEF from July 2021 was 60 to 65% Place patient on Lasix 40 mg IV daily Optimize blood pressure control Consult cardiology Maintain low-sodium diet   Hypertensive urgency Patient was significantly elevated blood pressure upon arrival to the ER Optimize blood pressure  control Continue carvedilol and add nitrates May uptitrate dose of carvedilol to optimize blood pressure control   Diabetes mellitus with complications of stage III chronic kidney disease Patient is status post recent renal transplant Continue tacrolimus Consult nephrology Maintain consistent carbohydrate diet Sliding scale coverage for glycemic control   Hyperkalemia ??  Secondary to renal tubular acidosis type IV Expect improvement with IV diuretics Patient also received a dose of lokelma in the ER Repeat potassium levels   Anemia of chronic kidney disease Stable    History of coronary artery disease Continue carvedilol, nitrates, aspirin and atorvastatin     DVT prophylaxis: Lovenox Code Status: Full code Family Communication: Greater than 50% of time was spent discussing patient's condition and plan of care with her at the bedside.  All questions and concerns have been addressed.  She verbalizes understanding and agrees with the plan. Disposition Plan: Back to previous home environment Consults called: Nephrology/cardiology    Dilia Alemany MD [facility]     04/18/2020, 12:59 PM

## 2020-04-18 NOTE — ED Notes (Signed)
Resumed care from [place] rn.  Pt alert, watching tv.  Pt is on 2 liters oxygen/  nsr on monitor.  Pt waiting on bed assignment.

## 2020-04-18 NOTE — TOC Initial Note (Signed)
Transition of Care [facility]) - Initial/Assessment Note    Patient Details  Name: Theresa [name] MRN: [id number] Date of Birth: 1955/11/10  [facility]) CM/SW Contact:    Ova Freshwater Phone Number: [phone] 04/18/2020, 4:03 PM  Clinical Narrative:                  CSW called and left voicemail for patient, requested a call back.       Patient Goals and CMS Choice        Expected Discharge Plan and Services                                                Prior Living Arrangements/Services                       Activities of Daily Living      Permission Sought/Granted                  Emotional Assessment              Admission diagnosis:  CHF (congestive heart failure) ([place]) [I50.9] Patient Active Problem List   Diagnosis Date Noted  . Hyperkalemia 04/18/2020  . Hypertensive urgency 04/18/2020  . Chronic pain syndrome 02/27/2020  . Facet arthritis of cervical region 02/27/2020  . Lumbar radiculopathy 02/27/2020  . Hypercalcemia 02/27/2020  . Aortic atherosclerosis ([place]) 02/24/2020  . Bilateral leg weakness 01/06/2020  . Bilateral leg and foot pain 01/06/2020  . Numbness and tingling of both legs 01/06/2020  . Ulcer of esophagus without bleeding 06/20/2019  . Physical deconditioning 06/20/2019  . Vitamin D deficiency 06/20/2019  . Kidney transplanted 03/30/2019  . Immunocompromised patient ([place]) 03/30/2019  . Chronic illness 03/14/2019  . Dementia ([place]) 02/28/2019  . Abnormal MRI, lumbar spine 02/28/2019  . Pain due to onychomycosis of toenails of both feet 11/18/2018  . Tremor 09/23/2018  . Allergic rhinitis 09/23/2018  . Memory loss 05/25/2018  . Valvular heart disease 01/05/2018  . History of heart attack 01/05/2018  . Chronic back pain 01/05/2018  . Elevated alkaline phosphatase level 12/29/2017  . Thyroid nodule 08/11/2017  . CHF (congestive heart failure) ([place]) 07/26/2017  . PAD (peripheral  artery disease) ([place]) 03/07/2017  . Bilateral carotid artery stenosis 02/17/2017  . Complication of vascular access for dialysis 06/19/2016  . Atypical chest pain 05/21/2016  . Mitral regurgitation 02/06/2016  . OSA on CPAP 12/20/2015  . Screening for breast cancer 07/27/2015  . Dysuria 07/27/2015  . Acute on chronic diastolic CHF (congestive heart failure) ([place]) 05/07/2015  . S/P coronary artery stent placement   . Leg pain 03/05/2015  . Neuritis or radiculitis due to rupture of lumbar intervertebral disc 11/28/2014  . Routine general medical examination at a health care facility 09/02/2013  . Osteoarthritis of right hip 08/23/2013  . DDD (degenerative disc disease), lumbosacral 08/23/2013  . Right hip pain 08/08/2013  . Coronary artery disease of native artery of native heart with stable angina pectoris ([place]) 06/27/2013  . Diabetic retinopathy ([place]) 05/28/2013  . Hyperlipidemia   . Syncope 01/21/2013  . End stage renal disease ([place]) 11/08/2012  . Essential hypertension 11/08/2012  . Diabetic neuropathy ([place]) 02/18/2012  . Anemia in chronic kidney disease (CKD) 02/18/2012  . ESRD on hemodialysis ([place]) 08/21/2011  . Diabetes mellitus, type II ([place])  06/30/2011  . Chronic kidney disease (CKD), stage III (moderate) ([place]) 06/30/2011  . COPD (chronic obstructive pulmonary disease) ([place]) 06/13/2011   PCP:  McLean-Scocuzza, Nino Glow, MD Pharmacy:   [facility] [address], [place] [address] [place] [address] Phone: [phone] Fax: [phone]     Social Determinants of Health (SDOH) Interventions    Readmission Risk Interventions No flowsheet data found.

## 2020-04-18 NOTE — ED Notes (Signed)
Walked pt to the toilet. Pt is back in bed eating lunch and watching TV.

## 2020-04-18 NOTE — ED Triage Notes (Signed)
Pt here with SOB since last night. Pt has not been able to catch her breath since last night. Pt bp was 208/774 and has a hx of arthritis, htn. Copd, and a kidney transplant last year.

## 2020-04-19 ENCOUNTER — Ambulatory Visit: Payer: Medicare Other | Admitting: Podiatry

## 2020-04-19 DIAGNOSIS — I5033 Acute on chronic diastolic (congestive) heart failure: Secondary | ICD-10-CM | POA: Diagnosis not present

## 2020-04-19 DIAGNOSIS — E1122 Type 2 diabetes mellitus with diabetic chronic kidney disease: Secondary | ICD-10-CM

## 2020-04-19 DIAGNOSIS — N183 Chronic kidney disease, stage 3 unspecified: Secondary | ICD-10-CM

## 2020-04-19 DIAGNOSIS — I160 Hypertensive urgency: Secondary | ICD-10-CM | POA: Diagnosis not present

## 2020-04-19 DIAGNOSIS — Z940 Kidney transplant status: Secondary | ICD-10-CM | POA: Diagnosis not present

## 2020-04-19 DIAGNOSIS — Z794 Long term (current) use of insulin: Secondary | ICD-10-CM

## 2020-04-19 LAB — ECHOCARDIOGRAM COMPLETE
AR max vel: 1.11 cm2
AV Area VTI: 1.17 cm2
AV Area mean vel: 1.1 cm2
AV Mean grad: 12.5 mmHg
AV Peak grad: 26.3 mmHg
Ao pk vel: 2.56 m/s
Area-P 1/2: 3.17 cm2
Height: 67.5 in
P 1/2 time: 273 msec
S' Lateral: 2.73 cm
Weight: 2448 oz

## 2020-04-19 LAB — BASIC METABOLIC PANEL
Anion gap: 6 (ref 5–15)
BUN: 25 mg/dL — ABNORMAL HIGH (ref 8–23)
CO2: 24 mmol/L (ref 22–32)
Calcium: 9.4 mg/dL (ref 8.9–10.3)
Chloride: 106 mmol/L (ref 98–111)
Creatinine, Ser: 1.65 mg/dL — ABNORMAL HIGH (ref 0.44–1.00)
GFR, Estimated: 35 mL/min — ABNORMAL LOW (ref >60)
Glucose, Bld: 102 mg/dL — ABNORMAL HIGH (ref 70–99)
Potassium: 5.3 mmol/L — ABNORMAL HIGH (ref 3.5–5.1)
Sodium: 136 mmol/L (ref 135–145)

## 2020-04-19 LAB — GLUCOSE, CAPILLARY
Glucose-Capillary: 95 mg/dL (ref 70–99)
Glucose-Capillary: 126 mg/dL — ABNORMAL HIGH (ref 70–99)
Glucose-Capillary: 150 mg/dL — ABNORMAL HIGH (ref 70–99)
Glucose-Capillary: 116 mg/dL — ABNORMAL HIGH (ref 70–99)

## 2020-04-19 LAB — PROTEIN / CREATININE RATIO, URINE
Creatinine, Urine: 75 mg/dL
Protein Creatinine Ratio: 0.13 mg/mg{Cre} (ref 0.00–0.15)
Total Protein, Urine: 10 mg/dL

## 2020-04-19 LAB — URINALYSIS, ROUTINE W REFLEX MICROSCOPIC
Bilirubin Urine: NEGATIVE
Glucose, UA: NEGATIVE mg/dL
Hgb urine dipstick: NEGATIVE
Ketones, ur: NEGATIVE mg/dL
Leukocytes,Ua: NEGATIVE
Nitrite: NEGATIVE
Protein, ur: NEGATIVE mg/dL
Specific Gravity, Urine: 1.011 (ref 1.005–1.030)
pH: 7 (ref 5.0–8.0)

## 2020-04-19 MED ORDER — ACETAMINOPHEN 325 MG PO TABS
650.0000 mg | ORAL_TABLET | ORAL | Status: DC | PRN
  Administered 2020-04-19 – 2020-04-21 (×2): 650 mg via ORAL
  Filled 2020-04-19 (×2): qty 2

## 2020-04-19 MED ORDER — AMLODIPINE BESYLATE 10 MG PO TABS
10.0000 mg | ORAL_TABLET | Freq: Every day | ORAL | Status: DC
  Administered 2020-04-19 – 2020-04-21 (×3): 10 mg via ORAL
  Filled 2020-04-19 (×3): qty 1

## 2020-04-19 MED ORDER — ZOLPIDEM TARTRATE 5 MG PO TABS
5.0000 mg | ORAL_TABLET | Freq: Every evening | ORAL | Status: DC | PRN
  Administered 2020-04-19 – 2020-04-20 (×3): 5 mg via ORAL
  Filled 2020-04-19 (×3): qty 1

## 2020-04-19 NOTE — Progress Notes (Signed)
Mobility Specialist - Progress Note   04/19/20 1400  Mobility  Activity Contraindicated/medical hold  Mobility performed by Mobility specialist    Per chart review, pt's K+ still trending at 5.3 sitting outside safety guidelines for mobility. Will attempt session at another date/time when pt is medically appropriate.    Kathee Delton Mobility Specialist 04/19/20, 2:03 PM

## 2020-04-19 NOTE — Plan of Care (Signed)
  Problem: Education: Goal: Knowledge of General Education information will improve Description: Including pain rating scale, medication(s)/side effects and non-pharmacologic comfort measures Outcome: Progressing   Problem: Clinical Measurements: Goal: Ability to maintain clinical measurements within normal limits will improve Outcome: Progressing   Problem: Clinical Measurements: Goal: Diagnostic test results will improve Outcome: Progressing   Problem: Clinical Measurements: Goal: Respiratory complications will improve Outcome: Progressing   Problem: Clinical Measurements: Goal: Cardiovascular complication will be avoided Outcome: Progressing   Problem: Pain Managment: Goal: General experience of comfort will improve Outcome: Progressing   Problem: Safety: Goal: Ability to remain free from injury will improve Outcome: Progressing

## 2020-04-19 NOTE — Progress Notes (Signed)
   Heart Failure Nurse Navigator Note  HFpEF 60-65%  She presented to the ED with 3 day history of increasing SOB, PND and orthopnea.  CXR bilateral pleural effusions and pulmonary edema.  Co morbidities:  Hypertension Hyperlipidemia Emphysema/COPD Diabetes Coronary artery disease Anemia   Medications:  Norvasc 10 mg daily ASA 81 mg daily Lipitor 40 mg daily Coreg 12.5 mg BID  Labs:  Sodium 136, potassium 5.3,BUN 25, creatinine 1.65, troponin 12, HgbA1c 5, BNP 657  BMI 23.2 BP 156/59  Weight 68.5 kg (69.4)  Assessment:  General-is awake and alert.  HEENT- no JVD, pupils equal  Cardiac- heart tones regular, +MURMUR  Chest- breath sounds diminished in the bases.  Musculoskeletal- no edema  Psych is pleasant and appropriate  Neuro- speech is clear.   Spoke with patient with granddaughter at bedside.  She states she lives with her son, he does most of the cooking.  She does not add salt at the table, does not use can soups and if has canned vegetables they are rinsed very well.  She had been instructed by her kidney doctor to drink more water, which she had been doing 4 to 5 16 ounce bottles daily.  She does not have a scale and does not weigh daily, instructed on procedure of daily weight and recording and when to report to doctor.  She states she has the zone magnet and heart failure teaching booklet.  Will continue to follow.  Pricilla Riffle RN, CHFN

## 2020-04-19 NOTE — Evaluation (Signed)
Occupational Therapy Evaluation Patient Details Name: Theresa [name] MRN: [id number] DOB: 07-Jul-1955 Today's Date: 04/19/2020    History of Present Illness presented to ER secondary to progressive SOB x3 days; admitted for management of acute/chronic CHF exacerbation.   Clinical Impression   Patient presenting with decreased I in self care, balance, functional mobility/transfer, endurance, and safety awareness. Pt reports living with family members and being independent with self care tasks. Pt does report using SPC occasionally. Her son performs IADL tasks.Patient currently functioning at min guard overall for standing self care tasks, functional transfers, and LB clothing management without use of AD. Pt remains on 2 L O2 via [place] and demonstrated ability to manage her O2 line for toileting needs with min guard for balance. O2 saturation remained at or above 93% this session. Patient will benefit from acute OT to increase overall independence in the areas of ADLs, functional mobility, and safety awareness in order to safely discharge home with family.    Follow Up Recommendations  No OT follow up;Supervision - Intermittent    Equipment Recommendations  None recommended by OT       Precautions / Restrictions Precautions Precautions: Fall Precaution Comments: No BP L UE Restrictions Weight Bearing Restrictions: No      Mobility Bed Mobility Overal bed mobility: Modified Independent       General bed mobility comments: sit >supine    Transfers Overall transfer level: Needs assistance   Transfers: Sit to/from Stand Sit to Stand: Min guard         General transfer comment: does require UE support to complete    Balance Overall balance assessment: Needs assistance Sitting-balance support: No upper extremity supported;Feet supported Sitting balance-Leahy Scale: Good     Standing balance support: No upper extremity supported Standing balance-Leahy Scale: Fair             ADL either performed or assessed with clinical judgement   ADL Overall ADL's : Needs assistance/impaired     Grooming: Wash/dry hands;Wash/dry face;Standing;Supervision/safety      Toilet Transfer: Min guard;Ambulation   Toileting- Clothing Manipulation and Hygiene: Min guard;Sit to/from stand         General ADL Comments: min guard for balance with toilet transfer, clothing management, and hygiene     Vision Baseline Vision/History: Wears glasses Wears Glasses: At all times (does not have glasses in the hospital) Patient Visual Report: No change from baseline Vision Assessment?: No apparent visual deficits            Pertinent Vitals/Pain Pain Assessment: No/denies pain     Hand Dominance Right   Extremity/Trunk Assessment Upper Extremity Assessment Upper Extremity Assessment: Overall WFL for tasks assessed   Lower Extremity Assessment Lower Extremity Assessment: Generalized weakness   Cervical / Trunk Assessment Cervical / Trunk Assessment: Normal   Communication Communication Communication: No difficulties   Cognition Arousal/Alertness: Awake/alert Behavior During Therapy: WFL for tasks assessed/performed Overall Cognitive Status: Within Functional Limits for tasks assessed                     Home Living Family/patient expects to be discharged to:: Private residence Living Arrangements: with family Available Help at Discharge: Family;Available PRN/intermittently Type of Home: House Home Access: Stairs to enter [employer] of Steps: 4 Entrance Stairs-Rails: Right;Left;Can reach both Home Layout: One level     Bathroom Shower/Tub: Walk-in shower         Home Equipment: Kasandra Knudsen - single point  Prior Functioning/Environment Level of Independence: Independent        Comments: Mod indep with ADLs, household and limited community mobilization; intermittent use of SPC as needed with distances.  No home O2.         OT Problem List: Decreased strength;Decreased activity tolerance;Decreased safety awareness;Decreased knowledge of use of DME or AE;Impaired balance (sitting and/or standing);Decreased knowledge of precautions;Cardiopulmonary status limiting activity      OT Treatment/Interventions: Self-care/ADL training;Therapeutic exercise;Therapeutic activities;Energy conservation;Patient/family education;DME and/or AE instruction;Balance training;Neuromuscular education    OT Goals(Current goals can be found in the care plan section) Acute Rehab OT Goals Patient Stated Goal: to take a nap and to get back home OT Goal Formulation: With patient Time For Goal Achievement: 05/03/20 Potential to Achieve Goals: Good  OT Frequency: Min 1X/week   Barriers to D/C:    none known at this time          AM-PAC OT "6 Clicks" Daily Activity     Outcome Measure Help from another person eating meals?: None Help from another person taking care of personal grooming?: A Little Help from another person toileting, which includes using toliet, bedpan, or urinal?: A Little   Help from another person to put on and taking off regular upper body clothing?: None Help from another person to put on and taking off regular lower body clothing?: A Little 6 Click Score: 17   End of Session Equipment Utilized During Treatment: Oxygen (2L) Nurse Communication: Mobility status  Activity Tolerance: Patient tolerated treatment well Patient left: in bed;with nursing/sitter in room;with family/visitor present  OT Visit Diagnosis: Muscle weakness (generalized) (M62.81)                Time: 1224-8250 OT Time Calculation (min): 15 min Charges:  OT General Charges $OT Visit: 1 Visit OT Evaluation $OT Eval Low Complexity: 1 Low OT Treatments $Self Care/Home Management : 8-22 mins  Darleen Crocker, MS, OTR/L , CBIS ascom [phone]  04/19/20, 3:07 PM

## 2020-04-19 NOTE — Progress Notes (Signed)
SATURATION QUALIFICATIONS: (This note is used to comply with regulatory documentation for home oxygen)  Patient Saturations on Room Air at Rest = 87*%  Patient Saturations on Room Air while Ambulating = na%  Patient Saturations on 2Liters of oxygen while at rest = 95%  Please briefly explain why patient needs home oxygen:

## 2020-04-19 NOTE — Progress Notes (Addendum)
PROGRESS NOTE    [name]  UPJ:[id number] DOB: Jun 12, 1955 DOA: 04/18/2020 PCP: McLean-Scocuzza, Nino Glow, MD  Outpatient Specialists: transplant clinic    Brief Narrative:   Theresa [name] is a 64 y.o. female with medical history significant for chronic diastolic dysfunction CHF, status post recent renal transplant, diabetes mellitus with complications of stage III chronic kidney disease, hypertension who presents to the ER for evaluation of a 3-day history of shortness of breath which has progressively worsened over the last couple of days associated with orthopnea.  Patient states that she had to sit up to be able to sleep because she could not lay down due to shortness of breath.   She has no lower extremity swelling.  She denies having any cough, no fever, no chills, no chest pain, no nausea, no vomiting, no diaphoresis, no palpitation, no abdominal pain, no changes in her bowel habits or any urinary symptoms. She was noted to have significantly elevated blood pressure upon arrival to the ER, 208/74 Labs show sodium 136, potassium 5.6, chloride 107, bicarb 20, glucose 124, BUN 23, creatinine 1.37, calcium 9.5, alkaline phosphatase 98, albumin 3.5, lipase 19, AST 14, ALT 9, total protein 6.8, BNP 657, troponin XI, white count 5.9, hemoglobin 9.1, hematocrit 27.8, MCV 96.5, RDW 15, platelet count 182 Respiratory viral panel is negative CT scan of the chest without contrast shows bilateral pleural effusions and diffuse parenchymal edema consistent with CHF.  Some basilar atelectasis is noted as well. Chest x-ray reviewed by me shows a small right pleural effusion Twelve-lead EKG reviewed by me shows sinus rhythm, LVH and nonspecific ST changes involving the inferior leads.    ED Course: Patient is a 64 year old African-American female who presents to the ER for evaluation of a 3-day history of worsening shortness of breath associated with orthopnea.  Patient's blood pressure was  significantly elevated upon arrival to the ER.  Imaging is consistent with CHF and BNP is elevated.  She will be admitted to the hospital for further evaluation and treatment.   Assessment & Plan:   Principal Problem:   Acute on chronic diastolic CHF (congestive heart failure) (HCC) Active Problems:   Diabetes mellitus, type II (HCC)   Chronic kidney disease (CKD), stage III (moderate) (HCC)   Anemia in chronic kidney disease (CKD)   Kidney transplanted   Hyperkalemia   Hypertensive urgency  # Acute on chronic CHF Hx diastolic dysfunction. Also w/ hypertensive urgency. Last known LVEF from July 2021 was 60 to 65%. CT with b/l pleural effusions and pulm edema consistent w/ chf. BNP 657. - cardiology consulted, appreciate recs - cont Lasix 40 mg IV daily - strict I/os and daily weights ordered, though no outs charted. - Maintain low-sodium diet - would sample pleural fluid if pt does not make expected progress w/ diuresis - f/u results of TTE  # Acute hypoxic respiratory failure - O2 87% on room air at rest. - cont [place] O2 @ 2 L  # Hypertensive urgency Patient was significantly elevated blood pressure upon arrival to the ER. Volume overload likely contributory. BP somewhat improved this AM to 169/54. - cont carvedilol and lasix, consider addition of additional agent if BP fails to continue to improve.   # Diabetes mellitus - glucose appropriate - SSI  # Stage III chronic kidney disease Patient is status post recent renal transplant. Baseline cr 1.4, up to 1.65 today - Continue tacrolimus - nephrology consulted  # Hyperkalemia Improved this AM to 5.3 - cont  diuretics and lokelma  # Anemia of chronic kidney disease Stable h 9.1  # History of coronary artery disease - Continue carvedilol, aspirin and atorvastatin'  # Chronic pain - cont home oxy, pregabalin   DVT prophylaxis: lovenox Code Status: full Family Communication: none @ bedside  Status is:  Inpatient  Remains inpatient appropriate because:Inpatient level of care appropriate due to severity of illness   Dispo: The patient is from: Home              Anticipated d/c is to: Home. Have ordered pt/ot consult              Anticipated d/c date is: 3 days              Patient currently is not medically stable to d/c.        Consultants:  Nephrology, cardiology  Procedures: none  Antimicrobials:  none    Subjective: Overnight says breathing slightly improved. No cough or fever or chest pain. Has appetite. Says had decent amount of urination overnight.   Objective: Vitals:   04/18/20 1730 04/18/20 1958 04/19/20 0240 04/19/20 0529  BP:  (!) 162/50  (!) 169/54  Pulse:  66  62  Resp: 17 20  20   Temp:  98.1 F (36.7 C)  98.4 F (36.9 C)  TempSrc:  Oral  Oral  SpO2:  100%  100%  Weight:   68.5 kg   Height:       No intake or output data in the 24 hours ending 04/19/20 0757 Filed Weights   04/18/20 0801 04/19/20 0240  Weight: 69.4 kg 68.5 kg    Examination:  Constitutional: NAD, alert and oriented x 3.  Chronically ill-appearing Eyes: PERRL, lids and conjunctivae pallor ENMT: Mucous membranes are moist.  Neck: normal, supple, no masses, no thyromegaly Respiratory: decreased @ bases, rales @ bases, no wheeze Cardiovascular: Regular rate and rhythm, no murmurs / rubs / gallops. No extremity edema. 2+ pedal pulses. No carotid bruits.  Abdomen: no tenderness, no masses palpated. No hepatosplenomegaly. Bowel sounds positive.  Musculoskeletal: no clubbing / cyanosis. No joint deformity upper and lower extremities.  Skin: no rashes, lesions, ulcers.  Neurologic: No gross focal neurologic deficit. Psychiatric: Normal mood and affect.    Data Reviewed: I have personally reviewed following labs and imaging studies  CBC: Recent Labs  Lab 04/18/20 0802  WBC 5.9  HGB 9.1*  HCT 27.8*  MCV 96.5  PLT 269   Basic Metabolic Panel: Recent Labs  Lab  04/18/20 0802 04/18/20 1820 04/19/20 0426  NA 136  --  136  K 5.6* 5.3* 5.3*  CL 107  --  106  CO2 22  --  24  GLUCOSE 124*  --  102*  BUN 23  --  25*  CREATININE 1.37*  --  1.65*  CALCIUM 9.5  --  9.4   GFR: Estimated Creatinine Clearance: 35 mL/min (A) (by C-G formula based on SCr of 1.65 mg/dL (H)). Liver Function Tests: Recent Labs  Lab 04/18/20 0802  AST 14*  ALT 9  ALKPHOS 98  BILITOT 1.0  PROT 6.8  ALBUMIN 3.5   Recent Labs  Lab 04/18/20 0802  LIPASE 19   No results for input(s): AMMONIA in the last 168 hours. Coagulation Profile: No results for input(s): INR, PROTIME in the last 168 hours. Cardiac Enzymes: No results for input(s): CKTOTAL, CKMB, CKMBINDEX, TROPONINI in the last 168 hours. BNP (last 3 results) No results for input(s): PROBNP in the  last 8760 hours. HbA1C: Recent Labs    04/18/20 1358  HGBA1C 5.0   CBG: Recent Labs  Lab 04/18/20 2139  GLUCAP 174*   Lipid Profile: No results for input(s): CHOL, HDL, LDLCALC, TRIG, CHOLHDL, LDLDIRECT in the last 72 hours. Thyroid Function Tests: No results for input(s): TSH, T4TOTAL, FREET4, T3FREE, THYROIDAB in the last 72 hours. Anemia Panel: No results for input(s): VITAMINB12, FOLATE, FERRITIN, TIBC, IRON, RETICCTPCT in the last 72 hours. Urine analysis:    Component Value Date/Time   COLORURINE Yellow 06/26/2012 2030   APPEARANCEUR Hazy 06/26/2012 2030   LABSPEC 1.010 06/26/2012 2030   PHURINE 8.0 06/26/2012 2030   GLUCOSEU 50 mg/dL 06/26/2012 2030   HGBUR Negative 06/26/2012 2030   BILIRUBINUR Negative 06/26/2012 2030   [place] Negative 06/26/2012 2030   PROTEINUR >=500 06/26/2012 2030   NITRITE Negative 06/26/2012 2030   LEUKOCYTESUR Negative 06/26/2012 2030   Sepsis Labs: @LABRCNTIP (procalcitonin:4,lacticidven:4)  ) Recent Results (from the past 240 hour(s))  Respiratory Panel by RT PCR (Flu A&B, Covid) - Nasopharyngeal Swab     Status: None   Collection Time: 04/18/20  8:02  AM   Specimen: Nasopharyngeal Swab  Result Value Ref Range Status   SARS Coronavirus 2 by RT PCR NEGATIVE NEGATIVE Final    Comment: (NOTE) SARS-CoV-2 target nucleic acids are NOT DETECTED.  The SARS-CoV-2 RNA is generally detectable in upper respiratoy specimens during the acute phase of infection. The lowest concentration of SARS-CoV-2 viral copies this assay can detect is 131 copies/mL. A negative result does not preclude SARS-Cov-2 infection and should not be used as the sole basis for treatment or other patient management decisions. A negative result may occur with  improper specimen collection/handling, submission of specimen other than nasopharyngeal swab, presence of viral mutation(s) within the areas targeted by this assay, and inadequate number of viral copies (<131 copies/mL). A negative result must be combined with clinical observations, patient history, and epidemiological information. The expected result is Negative.  Fact Sheet for Patients:  PinkCheek.be  Fact Sheet for Healthcare Providers:  GravelBags.it  This test is no t yet approved or cleared by the [place] FDA and  has been authorized for detection and/or diagnosis of SARS-CoV-2 by FDA under an Emergency Use Authorization (EUA). This EUA will remain  in effect (meaning this test can be used) for the duration of the COVID-19 declaration under Section 564(b)(1) of the Act, 21 U.S.C. section 360bbb-3(b)(1), unless the authorization is terminated or revoked sooner.     Influenza A by PCR NEGATIVE NEGATIVE Final   Influenza B by PCR NEGATIVE NEGATIVE Final    Comment: (NOTE) The Xpert Xpress SARS-CoV-2/FLU/RSV assay is intended as an aid in  the diagnosis of influenza from Nasopharyngeal swab specimens and  should not be used as a sole basis for treatment. Nasal washings and  aspirates are unacceptable for Xpert Xpress SARS-CoV-2/FLU/RSV   testing.  Fact Sheet for Patients: PinkCheek.be  Fact Sheet for Healthcare Providers: GravelBags.it  This test is not yet approved or cleared by the [place] FDA and  has been authorized for detection and/or diagnosis of SARS-CoV-2 by  FDA under an Emergency Use Authorization (EUA). This EUA will remain  in effect (meaning this test can be used) for the duration of the  Covid-19 declaration under Section 564(b)(1) of the Act, 21  U.S.C. section 360bbb-3(b)(1), unless the authorization is  terminated or revoked. Performed at [facility], [address] [place],  [address]  Radiology Studies: DG Chest 2 View  Result Date: 04/18/2020 CLINICAL DATA:  Shortness of breath EXAM: CHEST - 2 VIEW COMPARISON:  February 23, 2018 FINDINGS: There is a small right pleural effusion. There is no appreciable edema or airspace opacity. Heart is upper normal in size with pulmonary vascularity normal. There is aortic atherosclerosis. There is a subclavian region stent on the left. No bone lesions. IMPRESSION: Small right pleural effusion. No edema or airspace opacity. Heart upper normal in size. Aortic Atherosclerosis (ICD10-I70.0). Electronically Signed   By: Lowella Grip III M.D.   On: 04/18/2020 08:39   CT CHEST WO CONTRAST  Result Date: 04/18/2020 CLINICAL DATA:  Shortness of breath EXAM: CT CHEST WITHOUT CONTRAST TECHNIQUE: Multidetector CT imaging of the chest was performed following the standard protocol without IV contrast. COMPARISON:  Chest x-ray from earlier in the same day. CT from 01/12/2018 FINDINGS: Cardiovascular: Limited due to lack of IV contrast. Aortic atherosclerotic calcifications are noted without aneurysmal dilatation. Heavy coronary calcifications are seen. No cardiac enlargement is noted. Left subclavian vein stent is noted. Mediastinum/Nodes: Thoracic inlet is within normal limits.  No hilar or mediastinal adenopathy is noted. The esophagus as visualized is within normal limits. Lungs/Pleura: Bilateral moderate to large pleural effusions are noted greater than that expected from recent chest x-ray. Bibasilar atelectatic changes are seen. Some mild generalized edema is noted within the lungs bilaterally consistent with congestive failure. No sizable parenchymal nodule is noted. Upper Abdomen: Visualized upper abdomen is within normal limits. The kidneys are somewhat shrunken consistent with end-stage renal disease. Musculoskeletal: No chest wall mass or suspicious bone lesions identified. IMPRESSION: Bilateral pleural effusions and diffuse parenchymal edema consistent with congestive failure. Some basilar atelectasis is noted as well. Aortic Atherosclerosis (ICD10-I70.0). Electronically Signed   By: Inez Catalina M.D.   On: 04/18/2020 11:33        Scheduled Meds: . aspirin EC  81 mg Oral QHS  . atorvastatin  40 mg Oral q1800  . carvedilol  12.5 mg Oral BID WC  . enoxaparin (LOVENOX) injection  40 mg Subcutaneous Q24H  . furosemide  40 mg Intravenous Daily  . insulin aspart  0-9 Units Subcutaneous TID WC  . magnesium oxide  400 mg Oral Daily  . nitroGLYCERIN  1 inch Topical Q6H  . pregabalin  75 mg Oral BID  . sodium chloride flush  3 mL Intravenous Q12H  . sodium zirconium cyclosilicate  10 g Oral Daily  . Tacrolimus ER  8 mg Oral q morning - 10a   Continuous Infusions: . sodium chloride       LOS: 1 day    Time spent: 35 min    Desma Maxim, MD [facility]   If 7PM-7AM, please contact night-coverage [email or web address] Password [facility] 04/19/2020, 7:57 AM

## 2020-04-19 NOTE — Progress Notes (Signed)
[place], [place] 04/19/20  Subjective:   Hospital day # 1 Patient presented to ED on 04/18/2020 for worsening shortness of breath. She is known to our practice from when she was on dialysis in 2020.  She is s/p kidney transplant in October 2020.   Patient is laying in bed, resting with 2L O2 nasal cannula flowing. She is more alert today and reports that her shortness of breath has improved some but she still requires oxygen supplementation. She states her appetite is good and she is waiting for her breakfast. She denies any other symptoms at the moment. She does not have any other questions or concerns.  Objective:  Vital signs in last 24 hours:  Temp:  [97.6 F (36.4 C)-98.4 F (36.9 C)] 97.9 F (36.6 C) (11/11 1112) Pulse Rate:  [62-68] 62 (11/11 1112) Resp:  [7-26] 18 (11/11 1112) BP: (141-186)/(50-64) 141/64 (11/11 1112) SpO2:  [87 %-100 %] 96 % (11/11 1112) Weight:  [68.5 kg] 68.5 kg (11/11 0240)  Weight change:  Filed Weights   04/18/20 0801 04/19/20 0240  Weight: 69.4 kg 68.5 kg    Intake/Output:    Intake/Output Summary (Last 24 hours) at 04/19/2020 1322 Last data filed at 04/19/2020 1235 Gross per 24 hour  Intake 240 ml  Output 300 ml  Net -60 ml    Physical Exam: General:  No acute distress, laying in the bed, comfortable  HEENT  anicteric, moist oral mucous membrane  Pulm/lungs  normal breathing effort, + fine crackles at bases  CVS/Heart  regular rhythm, no rub or gallop  Abdomen:   Soft, nontender  Extremities:  No peripheral edema noted  Neurologic:  Alert, able to follow commands, responds appropriately   Skin:  No acute rashes or lesions noted     Basic Metabolic Panel:  Recent Labs  Lab 04/18/20 0802 04/18/20 1820 04/19/20 0426  NA 136  --  136  K 5.6* 5.3* 5.3*  CL 107  --  106  CO2 22  --  24  GLUCOSE 124*  --  102*  BUN 23  --  25*  CREATININE 1.37*  --  1.65*  CALCIUM 9.5  --  9.4      CBC: Recent Labs  Lab 04/18/20 0802  WBC 5.9  HGB 9.1*  HCT 27.8*  MCV 96.5  PLT 182      Lab Results  Component Value Date   HEPBSAG Negative 05/22/2016   HEPBSAB NONREACTIVE 11/12/2012      Microbiology:  Recent Results (from the past 240 hour(s))  Respiratory Panel by RT PCR (Flu A&B, Covid) - Nasopharyngeal Swab     Status: None   Collection Time: 04/18/20  8:02 AM   Specimen: Nasopharyngeal Swab  Result Value Ref Range Status   SARS Coronavirus 2 by RT PCR NEGATIVE NEGATIVE Final    Comment: (NOTE) SARS-CoV-2 target nucleic acids are NOT DETECTED.  The SARS-CoV-2 RNA is generally detectable in upper respiratoy specimens during the acute phase of infection. The lowest concentration of SARS-CoV-2 viral copies this assay can detect is 131 copies/mL. A negative result does not preclude SARS-Cov-2 infection and should not be used as the sole basis for treatment or other patient management decisions. A negative result may occur with  improper specimen collection/handling, submission of specimen other than nasopharyngeal swab, presence of viral mutation(s) within the areas targeted by this assay, and inadequate number of viral copies (<131 copies/mL). A negative result must be combined with clinical observations, patient  history, and epidemiological information. The expected result is Negative.  Fact Sheet for Patients:  PinkCheek.be  Fact Sheet for Healthcare Providers:  GravelBags.it  This test is no t yet approved or cleared by the [place] FDA and  has been authorized for detection and/or diagnosis of SARS-CoV-2 by FDA under an Emergency Use Authorization (EUA). This EUA will remain  in effect (meaning this test can be used) for the duration of the COVID-19 declaration under Section 564(b)(1) of the Act, 21 U.S.C. section 360bbb-3(b)(1), unless the authorization is terminated or revoked  sooner.     Influenza A by PCR NEGATIVE NEGATIVE Final   Influenza B by PCR NEGATIVE NEGATIVE Final    Comment: (NOTE) The Xpert Xpress SARS-CoV-2/FLU/RSV assay is intended as an aid in  the diagnosis of influenza from Nasopharyngeal swab specimens and  should not be used as a sole basis for treatment. Nasal washings and  aspirates are unacceptable for Xpert Xpress SARS-CoV-2/FLU/RSV  testing.  Fact Sheet for Patients: PinkCheek.be  Fact Sheet for Healthcare Providers: GravelBags.it  This test is not yet approved or cleared by the [place] FDA and  has been authorized for detection and/or diagnosis of SARS-CoV-2 by  FDA under an Emergency Use Authorization (EUA). This EUA will remain  in effect (meaning this test can be used) for the duration of the  Covid-19 declaration under Section 564(b)(1) of the Act, 21  U.S.C. section 360bbb-3(b)(1), unless the authorization is  terminated or revoked. Performed at [facility], [address] [place], [place] [address]     Coagulation Studies: No results for input(s): LABPROT, INR in the last 72 hours.  Urinalysis: No results for input(s): COLORURINE, LABSPEC, PHURINE, GLUCOSEU, HGBUR, BILIRUBINUR, KETONESUR, PROTEINUR, UROBILINOGEN, NITRITE, LEUKOCYTESUR in the last 72 hours.  Invalid input(s): APPERANCEUR    Imaging: DG Chest 2 View  Result Date: 04/18/2020 CLINICAL DATA:  Shortness of breath EXAM: CHEST - 2 VIEW COMPARISON:  February 23, 2018 FINDINGS: There is a small right pleural effusion. There is no appreciable edema or airspace opacity. Heart is upper normal in size with pulmonary vascularity normal. There is aortic atherosclerosis. There is a subclavian region stent on the left. No bone lesions. IMPRESSION: Small right pleural effusion. No edema or airspace opacity. Heart upper normal in size. Aortic Atherosclerosis (ICD10-I70.0). Electronically  Signed   By: Lowella Grip III M.D.   On: 04/18/2020 08:39   CT CHEST WO CONTRAST  Result Date: 04/18/2020 CLINICAL DATA:  Shortness of breath EXAM: CT CHEST WITHOUT CONTRAST TECHNIQUE: Multidetector CT imaging of the chest was performed following the standard protocol without IV contrast. COMPARISON:  Chest x-ray from earlier in the same day. CT from 01/12/2018 FINDINGS: Cardiovascular: Limited due to lack of IV contrast. Aortic atherosclerotic calcifications are noted without aneurysmal dilatation. Heavy coronary calcifications are seen. No cardiac enlargement is noted. Left subclavian vein stent is noted. Mediastinum/Nodes: Thoracic inlet is within normal limits. No hilar or mediastinal adenopathy is noted. The esophagus as visualized is within normal limits. Lungs/Pleura: Bilateral moderate to large pleural effusions are noted greater than that expected from recent chest x-ray. Bibasilar atelectatic changes are seen. Some mild generalized edema is noted within the lungs bilaterally consistent with congestive failure. No sizable parenchymal nodule is noted. Upper Abdomen: Visualized upper abdomen is within normal limits. The kidneys are somewhat shrunken consistent with end-stage renal disease. Musculoskeletal: No chest wall mass or suspicious bone lesions identified. IMPRESSION: Bilateral pleural effusions and diffuse parenchymal edema consistent with congestive  failure. Some basilar atelectasis is noted as well. Aortic Atherosclerosis (ICD10-I70.0). Electronically Signed   By: Inez Catalina M.D.   On: 04/18/2020 11:33     Medications:       Assessment/ Plan:  64 y.o. female with   diabetes type 2 with severe complications including diabetic retinopathy, nephropathy, long standing hypertension, COPD, hyperlipidemia, congestive heart failure coronary disease with a history of non-STEMI and PCI 2014, Renal Transplant  03/2020, severe neuropathy, OSA in CPAP admitted on 04/18/2020 for CHF  (congestive heart failure) ([place]) [I50.9] Pleural effusion on right [J90] Secondary hypertension [I15.9] Dyspnea, unspecified type [R06.00]  AKI , Renal Transplant Status S/p Deceased donor transplant on April 07, 2019 ESRD presumed secondary to DM Baseline Creatinine 1.4 (range 0.9-1.4)  Immunosuppression Tacrolimus (ENVARSUS XR) 4 mg Tb24 extended release tablet Take 2 tablets (8 mg total) by mouth every morning Last trough 6.8 on 02/01/2020 Myfortic on hold starting 09/12/19 due to leukopenia and diarrhea H/o post transplant UTI - proteus, citrobater Continue home immunosuppression meds  Acute increase in creatinine is concerning Likely related to aggressive diuresis  Will monitor closely  HTN With h/o postural hypotension and syncope BP was 120's outpatient BP inpatient running between 140's-160's - Continue carvedilol and amlodipine  HFpEF EF 65-70 % from 03/2019; with mod to severe MR  Hyperkalemia Potassium 5.3 - Continue Lokelma daily  Shortness of breath with severe hypertension Shortness of breath is likely due to elevated blood pressure /volume overload and history of mitral regurgitation leading to pulmonary edema - Repeat dose of IV Lasix - Continue to monitor closely    LOS: 1 Haynes Kerns 11/11/20211:22 PM  [place], [place]   Patient was seen and evaluated with [place] PA student, Haynes Kerns.  She assisted with transcription of the note.  I agree with the note as documented.  Murlean Iba, MD [facility] 11/11/20212:29 PM   Note: This note was prepared with Dragon dictation. Any transcription errors are unintentional

## 2020-04-19 NOTE — Progress Notes (Addendum)
Progress Note  Patient Name: Theresa [name] Date of Encounter: 04/19/2020  Primary Cardiologist: Rockey Situ  Subjective   Dyspnea about the same. No chest pain. Currently undergoing room air trial. Slight bump in renal function this morning. No documented UOP for the admission. Weight 69.4-->68.5 kg.   Inpatient Medications    Scheduled Meds: . aspirin EC  81 mg Oral QHS  . atorvastatin  40 mg Oral q1800  . carvedilol  12.5 mg Oral BID WC  . enoxaparin (LOVENOX) injection  40 mg Subcutaneous Q24H  . insulin aspart  0-9 Units Subcutaneous TID WC  . magnesium oxide  400 mg Oral Daily  . nitroGLYCERIN  1 inch Topical Q6H  . pregabalin  75 mg Oral BID  . sodium chloride flush  3 mL Intravenous Q12H  . sodium zirconium cyclosilicate  10 g Oral Daily  . Tacrolimus ER  8 mg Oral q morning - 10a   Continuous Infusions: . sodium chloride     PRN Meds: sodium chloride, acetaminophen, albuterol, nitroGLYCERIN, ondansetron (ZOFRAN) IV, oxyCODONE-acetaminophen, sodium chloride flush, zolpidem   Vital Signs    Vitals:   04/18/20 1958 04/19/20 0240 04/19/20 0529 04/19/20 0806  BP: (!) 162/50  (!) 169/54 (!) 156/59  Pulse: 66  62 65  Resp: 20  20 18   Temp: 98.1 F (36.7 C)  98.4 F (36.9 C) 97.6 F (36.4 C)  TempSrc: Oral  Oral Oral  SpO2: 100%  100% 96%  Weight:  68.5 kg    Height:       No intake or output data in the 24 hours ending 04/19/20 0841 Filed Weights   04/18/20 0801 04/19/20 0240  Weight: 69.4 kg 68.5 kg    Telemetry    SR - Personally Reviewed  ECG    No new tracings - Personally Reviewed  Physical Exam   GEN: No acute distress.   Neck: No JVD. Cardiac: RRR, II/VI systolic murmur LSB, no rubs, or gallops.  Respiratory: Faintly diminished breath sounds along the bases bilaterally.  GI: Soft, nontender, non-distended.   MS: No edema; No deformity. Neuro:  Alert and oriented x 3; Nonfocal.  Psych: Normal affect.  Labs    Chemistry Recent Labs    Lab 04/18/20 0802 04/18/20 1820 04/19/20 0426  NA 136  --  136  K 5.6* 5.3* 5.3*  CL 107  --  106  CO2 22  --  24  GLUCOSE 124*  --  102*  BUN 23  --  25*  CREATININE 1.37*  --  1.65*  CALCIUM 9.5  --  9.4  PROT 6.8  --   --   ALBUMIN 3.5  --   --   AST 14*  --   --   ALT 9  --   --   ALKPHOS 98  --   --   BILITOT 1.0  --   --   GFRNONAA 44*  --  35*  ANIONGAP 7  --  6     Hematology Recent Labs  Lab 04/18/20 0802  WBC 5.9  RBC 2.88*  HGB 9.1*  HCT 27.8*  MCV 96.5  MCH 31.6  MCHC 32.7  RDW 15.0  PLT 182    Cardiac EnzymesNo results for input(s): TROPONINI in the last 168 hours. No results for input(s): TROPIPOC in the last 168 hours.   BNP Recent Labs  Lab 04/18/20 0802  BNP 657.1*     DDimer No results for input(s): DDIMER in the  last 168 hours.   Radiology    DG Chest 2 View  Result Date: 04/18/2020 IMPRESSION: Small right pleural effusion. No edema or airspace opacity. Heart upper normal in size. Aortic Atherosclerosis (ICD10-I70.0). Electronically Signed   By: Lowella Grip III M.D.   On: 04/18/2020 08:39   CT CHEST WO CONTRAST  Result Date: 04/18/2020 IMPRESSION: Bilateral pleural effusions and diffuse parenchymal edema consistent with congestive failure. Some basilar atelectasis is noted as well. Aortic Atherosclerosis (ICD10-I70.0). Electronically Signed   By: Inez Catalina M.D.   On: 04/18/2020 11:33    Cardiac Studies   2D echo 12/2019: 1. Left ventricular ejection fraction, by estimation, is 60 to 65%. The  left ventricle has normal function. The left ventricle has no regional  wall motion abnormalities. There is moderate left ventricular hypertrophy.  Left ventricular diastolic  parameters are consistent with Grade II diastolic dysfunction  (pseudonormalization).  2. Right ventricular systolic function is normal. The right ventricular  size is normal.  3. Left atrial size was mildly dilated.  4. The mitral valve is normal in  structure. Moderate mitral valve  regurgitation.  5. The aortic valve is normal in structure. Aortic valve regurgitation is  mild to moderate. __________  2D echo this admission: -Pending  Patient Profile     64 y.o. female with history of CAD status post RCA stenting, moderate mitral regurgitation, HFpEF, ESRD presumed secondary to diabetic nephropathy previously on HD status post deceased donor renal transplant on 03/30/2019,COPD, PVD, carotid artery disease, DM2 with diabetic neuropathy and retinopathy, sickle cell trait, HTN, HLD, lumbar radiculopathy, thyroid nodule, esophageal stenosis with history of esophageal ulcers status post dilatation,and remote tobacco use who is being seen today for the evaluation of acute on chronic HFrEF at the request of Dr. Francine Graven.  Assessment & Plan    1. Acute on chronic HFrEF/pleural effusions: -Possibly exacerbated by accelerated hypertension -Feels like her symptoms are really not changed -Stop IV Lasix given worsening renal function -Update echo as outlined below given history of moderate mitral regurgitation -Daily weights -Strict I's and O's, not documented  -Recommend incentive spirometer  -Consider prn Lasix 20 mg at discharge   2. CAD involving the native coronary arteries without angina: -No symptoms of chest pain -High-sensitivity troponin negative x2, no need to cycle further -ASA -Coreg -Lipitor -No plans for inpatient ischemic evaluation at this time given lack of anginal symptoms, nonacute EKG, and a negative high-sensitivity troponin x2 -Needs refill of SL NTG at discharge (current bottle is 62 years old)  3. Moderate mitral regurgitation: -Possibly contributing to her presentation -Repeat echo, if there has been significant progression of her mitral valve disease she may need TEE  4. ESRD s/p renal transplant/hyperkalemia: -Worsening renal function this morning, hold IV Lasix this morning -Per IM/nephrology  -Monitor  with diuresis   5.  Accelerated hypertension: -Significantly hypertensive with systolic blood pressure greater than 200 upon arrival with BP currently 130Q systolic -Add back amlodipine 10 mg daily  -Stop nitropaste  -HR precludes titration of Coreg at this time, continue current dose of Coreg  6.  HLD: -PTA atorvastatin  For questions or updates, please contact [place] Please consult [email or web address] for contact info under Cardiology/STEMI.    Signed, Christell Faith, PA-C [place] Pager: [phone] 04/19/2020, 8:41 AM

## 2020-04-19 NOTE — Progress Notes (Signed)
Pt stated she slept for a few hours and feels better after getting ambien to help her sleep.

## 2020-04-19 NOTE — Progress Notes (Signed)
PT Cancellation Note  Patient Details Name: Theresa [name] MRN: [id number] DOB: September 22, 1955   Cancelled Treatment:    Reason Eval/Treat Not Completed:  (Consult received and chart reviewed. Patient voicing fatigue, hoping to take a rest; requests therapist re-attempt in PM if possible.  Will continue efforts at later date/time as medically appropriate and available.)  [name], PT, DPT, NCS 04/19/20, 11:34 AM [phone]

## 2020-04-19 NOTE — Evaluation (Signed)
Physical Therapy Evaluation Patient Details Name: Theresa [name] MRN: [id number] DOB: 08-04-1955 Today's Date: 04/19/2020   History of Present Illness  presented to ER secondary to progressive SOB x3 days; admitted for management of acute/chronic CHF exacerbation.  Clinical Impression  Upon evaluation, patient visiting with daughter (at bedside).  Awake and alert; oriented to basic information.  Do question full insight/awareness of health conditions and management needs.  Denies pain this date, endorsing only generalized fatigue.  Bilat UE/LE strength and ROM Grossly symmetrical and WFL; no focal weakness appreciated.  Able to complete bed mobility with mod indep; sit/stand, basic transfers and gait (200') without assist device, cga/min assist.  Demonstrates reciprocal stepping pattern; slightly increased sway with decreased heel strike/toe off bilat.  Mild deviation with head turns, but self-corrects without buckling or LOB.  Decreased cadence, gait speed.  Refuses trial of assist device.  Sats >93% on 2L with exertion.  Do anticipate possible need for supplemental O2 at discharge. Would benefit from skilled PT to address above deficits and promote optimal return to PLOF.;Recommend transition to HHPT upon discharge from acute hospitalization.     Follow Up Recommendations Home health PT    Equipment Recommendations       Recommendations for Other Services       Precautions / Restrictions Precautions Precautions: Fall Precaution Comments: No BP L UE Restrictions Weight Bearing Restrictions: No      Mobility  Bed Mobility Overal bed mobility: Modified Independent                  Transfers Overall transfer level: Needs assistance   Transfers: Sit to/from Stand Sit to Stand: Min guard         General transfer comment: does require UE support to complete  Ambulation/Gait Ambulation/Gait assistance: Min guard;Min assist Gait Distance (Feet): 200 Feet Assistive  device: None   Gait velocity: 10' walk time, 8-9 seconds   General Gait Details: reciprocal stepping pattern; slightly increased sway with decreased heel strike/toe off bilat.  Mild deviation with head turns, but self-corrects without buckling or LOB.  Decreased cadence, gait speed.  Refuses trial of assist device.  Sats >93% on 2L with exertion.  Stairs            Wheelchair Mobility    Modified Rankin (Stroke Patients Only)       Balance Overall balance assessment: Needs assistance Sitting-balance support: No upper extremity supported;Feet supported Sitting balance-Leahy Scale: Good     Standing balance support: No upper extremity supported Standing balance-Leahy Scale: Fair                               Pertinent Vitals/Pain Pain Assessment: No/denies pain    Home Living Family/patient expects to be discharged to:: Private residence Living Arrangements: Alone Available Help at Discharge: Family;Available PRN/intermittently Type of Home: House Home Access: Stairs to enter Entrance Stairs-Rails: Right;Left;Can reach both Entrance Stairs-Number of Steps: 4 Home Layout: One level Home Equipment: Cane - single point      Prior Function Level of Independence: Independent         Comments: Mod indep with ADLs, household and limited community mobilization; intermittent use of SPC as needed with distances.  No home O2.     Hand Dominance        Extremity/Trunk Assessment   Upper Extremity Assessment Upper Extremity Assessment: Overall WFL for tasks assessed (grossly at least 4+/5 throughout)  Lower Extremity Assessment Lower Extremity Assessment: Generalized weakness (grossly 3+ to 4-/5 throughout)       Communication   Communication: No difficulties  Cognition Arousal/Alertness: Awake/alert Behavior During Therapy: WFL for tasks assessed/performed Overall Cognitive Status: Within Functional Limits for tasks assessed                                         General Comments      Exercises Other Exercises Other Exercises: Educated in role of PT and progressive mobility; importance of pursed lip breathing with activity and monitoring O2.  Min cuing/assist for management of O2 tubing; do anticipate need for O2 at discharge. Other Exercises: Sit/stand x3 without assist device, cga/close sup-broad bOS, does require use of UEs   Assessment/Plan    PT Assessment Patient needs continued PT services  PT Problem List Decreased strength;Decreased range of motion;Decreased activity tolerance;Decreased balance;Decreased mobility;Decreased knowledge of use of DME;Decreased safety awareness;Decreased knowledge of precautions;Cardiopulmonary status limiting activity       PT Treatment Interventions DME instruction;Gait training;Functional mobility training;Therapeutic activities;Therapeutic exercise;Balance training;Patient/family education    PT Goals (Current goals can be found in the Care Plan section)  Acute Rehab PT Goals Patient Stated Goal: to take a nap and to get back home PT Goal Formulation: With patient/family Time For Goal Achievement: 05/03/20 Potential to Achieve Goals: Good    Frequency Min 2X/week   Barriers to discharge        Co-evaluation               AM-PAC PT "6 Clicks" Mobility  Outcome Measure Help needed turning from your back to your side while in a flat bed without using bedrails?: None Help needed moving from lying on your back to sitting on the side of a flat bed without using bedrails?: None Help needed moving to and from a bed to a chair (including a wheelchair)?: A Little Help needed standing up from a chair using your arms (e.g., wheelchair or bedside chair)?: A Little Help needed to walk in hospital room?: A Little Help needed climbing 3-5 steps with a railing? : A Little 6 Click Score: 20    End of Session Equipment Utilized During Treatment: Gait  belt;Oxygen Activity Tolerance: Patient tolerated treatment well Patient left: in bed;with call bell/phone within reach (OT at bedside for evaluation) Nurse Communication: Mobility status PT Visit Diagnosis: Muscle weakness (generalized) (M62.81);Difficulty in walking, not elsewhere classified (R26.2)    Time: 1594-5859 PT Time Calculation (min) (ACUTE ONLY): 25 min   Charges:   PT Evaluation $PT Eval Moderate Complexity: 1 Mod PT Treatments $Therapeutic Activity: 8-22 mins        Nicoli Nardozzi H. Owens Shark, PT, DPT, NCS 04/19/20, 2:20 PM [phone]

## 2020-04-19 NOTE — Unmapped (Signed)
Spoke to sister Clotilde Dieter who reports Michele Cisneros was admitted to the hospital yesterday for CHF    She is admitted at [facility] to patient who reports she is getting diuretics and not sure how long she has to stay at this point.  Is having to wear 2 L [place]    Let her know to keep [place] updated and call if she needs anything

## 2020-04-20 DIAGNOSIS — Z940 Kidney transplant status: Principal | ICD-10-CM

## 2020-04-20 DIAGNOSIS — N1832 Chronic kidney disease, stage 3b: Secondary | ICD-10-CM

## 2020-04-20 DIAGNOSIS — E1122 Type 2 diabetes mellitus with diabetic chronic kidney disease: Secondary | ICD-10-CM | POA: Diagnosis not present

## 2020-04-20 DIAGNOSIS — I5033 Acute on chronic diastolic (congestive) heart failure: Secondary | ICD-10-CM | POA: Diagnosis not present

## 2020-04-20 DIAGNOSIS — D631 Anemia in chronic kidney disease: Secondary | ICD-10-CM

## 2020-04-20 DIAGNOSIS — I160 Hypertensive urgency: Secondary | ICD-10-CM | POA: Diagnosis not present

## 2020-04-20 LAB — BASIC METABOLIC PANEL
Anion gap: 5 (ref 5–15)
BUN: 24 mg/dL — ABNORMAL HIGH (ref 8–23)
CO2: 25 mmol/L (ref 22–32)
Calcium: 9.5 mg/dL (ref 8.9–10.3)
Chloride: 105 mmol/L (ref 98–111)
Creatinine, Ser: 1.68 mg/dL — ABNORMAL HIGH (ref 0.44–1.00)
GFR, Estimated: 34 mL/min — ABNORMAL LOW (ref >60)
Glucose, Bld: 106 mg/dL — ABNORMAL HIGH (ref 70–99)
Potassium: 5.5 mmol/L — ABNORMAL HIGH (ref 3.5–5.1)
Sodium: 135 mmol/L (ref 135–145)

## 2020-04-20 LAB — CBC
HCT: 26.1 % — ABNORMAL LOW (ref 36.0–46.0)
Hemoglobin: 8.5 g/dL — ABNORMAL LOW (ref 12.0–15.0)
MCH: 31.5 pg (ref 26.0–34.0)
MCHC: 32.6 g/dL (ref 30.0–36.0)
MCV: 96.7 fL (ref 80.0–100.0)
Platelets: 174 10*3/uL (ref 150–400)
RBC: 2.7 MIL/uL — ABNORMAL LOW (ref 3.87–5.11)
RDW: 15.1 % (ref 11.5–15.5)
WBC: 4.5 10*3/uL (ref 4.0–10.5)
nRBC: 0 % (ref 0.0–0.2)

## 2020-04-20 LAB — GLUCOSE, CAPILLARY
Glucose-Capillary: 77 mg/dL (ref 70–99)
Glucose-Capillary: 124 mg/dL — ABNORMAL HIGH (ref 70–99)
Glucose-Capillary: 111 mg/dL — ABNORMAL HIGH (ref 70–99)
Glucose-Capillary: 138 mg/dL — ABNORMAL HIGH (ref 70–99)
Glucose-Capillary: 146 mg/dL — ABNORMAL HIGH (ref 70–99)

## 2020-04-20 LAB — HLA DS POST TRANSPLANT
ANTI-DONOR DRW #2 MFI: 0 MFI
ANTI-DONOR HLA-A #1 MFI: 2 MFI
ANTI-DONOR HLA-A #2 MFI: 0 MFI
ANTI-DONOR HLA-B #1 MFI: 10 MFI
ANTI-DONOR HLA-B #2 MFI: 42 MFI
ANTI-DONOR HLA-C #1 MFI: 0 MFI
ANTI-DONOR HLA-C #2 MFI: 0 MFI
ANTI-DONOR HLA-DP #2 MFI: 0 MFI
ANTI-DONOR HLA-DQB #1 MFI: 0 MFI
ANTI-DONOR HLA-DQB #2 MFI: 0 MFI
ANTI-DONOR HLA-DR #1 MFI: 0 MFI
ANTI-DONOR HLA-DR #2 MFI: 0 MFI

## 2020-04-20 LAB — FSAB CLASS 1 ANTIBODY SPECIFICITY: HLA CLASS 1 ANTIBODY RESULT: POSITIVE

## 2020-04-20 LAB — FSAB CLASS 2 ANTIBODY SPECIFICITY: HLA CL2 AB RESULT: NEGATIVE

## 2020-04-20 MED ORDER — SODIUM ZIRCONIUM CYCLOSILICATE 10 G PO PACK
10.0000 g | PACK | Freq: Three times a day (TID) | ORAL | Status: DC
  Administered 2020-04-20 – 2020-04-21 (×3): 10 g via ORAL
  Filled 2020-04-20 (×5): qty 1

## 2020-04-20 MED ORDER — CLONIDINE HCL 0.1 MG PO TABS
0.1000 mg | ORAL_TABLET | Freq: Two times a day (BID) | ORAL | Status: DC
  Administered 2020-04-20 – 2020-04-21 (×3): 0.1 mg via ORAL
  Filled 2020-04-20 (×3): qty 1

## 2020-04-20 NOTE — Progress Notes (Signed)
[place], [place] 04/20/20  Subjective:   Hospital day # 2 Patient presented to ED on 04/18/2020 for worsening shortness of breath. She is known to our practice from when she was on dialysis in 2020.  She is s/p kidney transplant in October 2020.   Patient is laying in bed, resting with 2L O2 nasal cannula flowing. She reports that her shortness of breath when she rests is improving, however she does still have difficulties breathing when ambulating. She reports she is urinating frequently due to the lasix and that her urine is a more yellow color than normal. Discussed with her that this could be from the medications she is taking. She denies nausea, vomiting or diarrhea.   Objective:  Vital signs in last 24 hours:  Temp:  [97.6 F (36.4 C)-98.1 F (36.7 C)] 97.6 F (36.4 C) (11/12 1138) Pulse Rate:  [62-68] 64 (11/12 1138) Resp:  [17-19] 19 (11/12 1138) BP: (145-168)/(46-53) 167/47 (11/12 1138) SpO2:  [93 %-100 %] 100 % (11/12 1138) Weight:  [68.9 kg] 68.9 kg (11/12 0413)  Weight change: -0.454 kg Filed Weights   04/18/20 0801 04/19/20 0240 04/20/20 0413  Weight: 69.4 kg 68.5 kg 68.9 kg    Intake/Output:    Intake/Output Summary (Last 24 hours) at 04/20/2020 1305 Last data filed at 04/20/2020 1138 Gross per 24 hour  Intake 363 ml  Output 800 ml  Net -437 ml    Physical Exam: General:  Laying in bed, no acute distress  HEENT  Normocephalic, anicteric, moist oral mucous membrane  Pulm/lungs  normal breathing effort, clear to auscultation  CVS/Heart  regular rate and rhythm, no rub or gallop  Abdomen:   Soft, nontender, non-distended  Extremities:  No peripheral edema noted  Neurologic:  Alert, able to follow commands, responds appropriately   Skin:  Warm, dry, no acute rashes or lesions noted     Basic Metabolic Panel:  Recent Labs  Lab 04/18/20 0802 04/18/20 1820 04/19/20 0426 04/20/20 0552  NA 136  --  136 135  K 5.6* 5.3*  5.3* 5.5*  CL 107  --  106 105  CO2 22  --  24 25  GLUCOSE 124*  --  102* 106*  BUN 23  --  25* 24*  CREATININE 1.37*  --  1.65* 1.68*  CALCIUM 9.5  --  9.4 9.5     CBC: Recent Labs  Lab 04/18/20 0802 04/20/20 0552  WBC 5.9 4.5  HGB 9.1* 8.5*  HCT 27.8* 26.1*  MCV 96.5 96.7  PLT 182 174      Lab Results  Component Value Date   HEPBSAG Negative 05/22/2016   HEPBSAB NONREACTIVE 11/12/2012      Microbiology:  Recent Results (from the past 240 hour(s))  Respiratory Panel by RT PCR (Flu A&B, Covid) - Nasopharyngeal Swab     Status: None   Collection Time: 04/18/20  8:02 AM   Specimen: Nasopharyngeal Swab  Result Value Ref Range Status   SARS Coronavirus 2 by RT PCR NEGATIVE NEGATIVE Final    Comment: (NOTE) SARS-CoV-2 target nucleic acids are NOT DETECTED.  The SARS-CoV-2 RNA is generally detectable in upper respiratoy specimens during the acute phase of infection. The lowest concentration of SARS-CoV-2 viral copies this assay can detect is 131 copies/mL. A negative result does not preclude SARS-Cov-2 infection and should not be used as the sole basis for treatment or other patient management decisions. A negative result may occur with  improper specimen collection/handling, submission  of specimen other than nasopharyngeal swab, presence of viral mutation(s) within the areas targeted by this assay, and inadequate number of viral copies (<131 copies/mL). A negative result must be combined with clinical observations, patient history, and epidemiological information. The expected result is Negative.  Fact Sheet for Patients:  PinkCheek.be  Fact Sheet for Healthcare Providers:  GravelBags.it  This test is no t yet approved or cleared by the [place] FDA and  has been authorized for detection and/or diagnosis of SARS-CoV-2 by FDA under an Emergency Use Authorization (EUA). This EUA will remain  in  effect (meaning this test can be used) for the duration of the COVID-19 declaration under Section 564(b)(1) of the Act, 21 U.S.C. section 360bbb-3(b)(1), unless the authorization is terminated or revoked sooner.     Influenza A by PCR NEGATIVE NEGATIVE Final   Influenza B by PCR NEGATIVE NEGATIVE Final    Comment: (NOTE) The Xpert Xpress SARS-CoV-2/FLU/RSV assay is intended as an aid in  the diagnosis of influenza from Nasopharyngeal swab specimens and  should not be used as a sole basis for treatment. Nasal washings and  aspirates are unacceptable for Xpert Xpress SARS-CoV-2/FLU/RSV  testing.  Fact Sheet for Patients: PinkCheek.be  Fact Sheet for Healthcare Providers: GravelBags.it  This test is not yet approved or cleared by the [place] FDA and  has been authorized for detection and/or diagnosis of SARS-CoV-2 by  FDA under an Emergency Use Authorization (EUA). This EUA will remain  in effect (meaning this test can be used) for the duration of the  Covid-19 declaration under Section 564(b)(1) of the Act, 21  U.S.C. section 360bbb-3(b)(1), unless the authorization is  terminated or revoked. Performed at [facility], [address] [place],  [address]     Coagulation Studies: No results for input(s): LABPROT, INR in the last 72 hours.  Urinalysis: Recent Labs    04/19/20 1640  COLORURINE YELLOW*  LABSPEC 1.011  PHURINE 7.0  GLUCOSEU NEGATIVE  HGBUR NEGATIVE  BILIRUBINUR NEGATIVE  KETONESUR NEGATIVE  PROTEINUR NEGATIVE  NITRITE NEGATIVE  LEUKOCYTESUR NEGATIVE      Imaging: ECHOCARDIOGRAM COMPLETE  Result Date: 04/19/2020    ECHOCARDIOGRAM REPORT   Patient Name:   Theresa [name] Date of Exam: 04/18/2020 Medical Rec #:  [id number]     Height:       67.5 in Accession #:    [id number]    Weight:       153.0 lb Date of Birth:  June 24, 1955     BSA:          1.815 m Patient Age:    64 years       BP:           162/50 mmHg Patient Gender: F             HR:           61 bpm. Exam Location:  [facility] Procedure: 2D Echo, Cardiac Doppler and Color Doppler Indications:     R06.00 Dyspnea  History:         Patient has prior history of Echocardiogram examinations, most                  recent 12/09/2019. Status post kidney transplant; Risk                  Factors:Hypertension, Dyslipidemia, Diabetes and Former Smoker.                  Emphysema.  Coronary artery disease. COPD. Peripheral vascular                  disease. Myocardial infarction. Pneumonia.  Sonographer:     Wilford Sports Rodgers-Jones Referring Phys:  [id number] Cando Diagnosing Phys: Ida Rogue MD IMPRESSIONS  1. Left ventricular ejection fraction, by estimation, is 60 to 65%. The left ventricle has normal function. The left ventricle has no regional wall motion abnormalities. There is mild left ventricular hypertrophy. Left ventricular diastolic parameters are consistent with Grade II diastolic dysfunction (pseudonormalization).  2. Right ventricular systolic function is normal. The right ventricular size is normal.  3. Left atrial size was moderately dilated.  4. Moderate mitral valve regurgitation.  5. Aortic valve regurgitation is mild. Mild aortic valve stenosis. FINDINGS  Left Ventricle: Left ventricular ejection fraction, by estimation, is 60 to 65%. The left ventricle has normal function. The left ventricle has no regional wall motion abnormalities. The left ventricular internal cavity size was normal in size. There is  mild left ventricular hypertrophy. Left ventricular diastolic parameters are consistent with Grade II diastolic dysfunction (pseudonormalization). Right Ventricle: The right ventricular size is normal. No increase in right ventricular wall thickness. Right ventricular systolic function is normal. Left Atrium: Left atrial size was moderately dilated. Right Atrium: Right atrial size was normal in size. Pericardium: There is no  evidence of pericardial effusion. Mitral Valve: The mitral valve is normal in structure. Moderate mitral valve regurgitation. No evidence of mitral valve stenosis. Tricuspid Valve: The tricuspid valve is normal in structure. Tricuspid valve regurgitation is not demonstrated. No evidence of tricuspid stenosis. Aortic Valve: The aortic valve is normal in structure. Aortic valve regurgitation is mild. Aortic regurgitation PHT measures 273 msec. Mild aortic stenosis is present. Aortic valve mean gradient measures 12.5 mmHg. Aortic valve peak gradient measures 26.3 mmHg. Aortic valve area, by VTI measures 1.17 cm. Pulmonic Valve: The pulmonic valve was normal in structure. Pulmonic valve regurgitation is not visualized. No evidence of pulmonic stenosis. Aorta: The aortic root is normal in size and structure. Venous: The inferior vena cava is normal in size with greater than 50% respiratory variability, suggesting right atrial pressure of 3 mmHg. IAS/Shunts: No atrial level shunt detected by color flow Doppler.  LEFT VENTRICLE PLAX 2D LVIDd:         4.21 cm  Diastology LVIDs:         2.73 cm  LV e' medial:    4.68 cm/s LV PW:         1.21 cm  LV E/e' medial:  37.2 LV IVS:        1.20 cm  LV e' lateral:   6.53 cm/s LVOT diam:     1.70 cm  LV E/e' lateral: 26.6 LV SV:         65 LV SV Index:   36 LVOT Area:     2.27 cm  RIGHT VENTRICLE             IVC RV Basal diam:  3.40 cm     IVC diam: 1.48 cm RV S prime:     11.40 cm/s TAPSE (M-mode): 2.4 cm LEFT ATRIUM             Index       RIGHT ATRIUM           Index LA diam:        5.00 cm 2.76 cm/m  RA Area:     13.40 cm LA Vol (A2C):  85.4 ml 47.06 ml/m RA Volume:   30.30 ml  16.70 ml/m LA Vol (A4C):   47.7 ml 26.29 ml/m LA Biplane Vol: 64.1 ml 35.32 ml/m  AORTIC VALVE AV Area (Vmax):    1.11 cm AV Area (Vmean):   1.10 cm AV Area (VTI):     1.17 cm AV Vmax:           256.25 cm/s AV Vmean:          165.500 cm/s AV VTI:            0.558 m AV Peak Grad:      26.3 mmHg  AV Mean Grad:      12.5 mmHg LVOT Vmax:         125.00 cm/s LVOT Vmean:        80.200 cm/s LVOT VTI:          0.287 m LVOT/AV VTI ratio: 0.51 AI PHT:            273 msec  AORTA Ao Root diam: 2.70 cm MITRAL VALVE MV Area (PHT): 3.17 cm     SHUNTS MV Decel Time: 239 msec     Systemic VTI:  0.29 m MV E velocity: 174.00 cm/s  Systemic Diam: 1.70 cm MV A velocity: 134.00 cm/s MV E/A ratio:  1.30 Ida Rogue MD Electronically signed by Ida Rogue MD Signature Date/Time: 04/19/2020/4:49:59 PM    Final      Medications:   Scheduled Meds: . amLODipine  10 mg Oral Daily  . aspirin EC  81 mg Oral QHS  . atorvastatin  40 mg Oral q1800  . carvedilol  12.5 mg Oral BID WC  . cloNIDine  0.1 mg Oral BID  . enoxaparin (LOVENOX) injection  40 mg Subcutaneous Q24H  . magnesium oxide  400 mg Oral Daily  . pregabalin  75 mg Oral BID  . sodium chloride flush  3 mL Intravenous Q12H  . sodium zirconium cyclosilicate  10 g Oral TID  . Tacrolimus ER  8 mg Oral q morning - 10a   Continuous Infusions: . sodium chloride     PRN Meds:.sodium chloride, acetaminophen, albuterol, nitroGLYCERIN, ondansetron (ZOFRAN) IV, oxyCODONE-acetaminophen, sodium chloride flush, zolpidem     Assessment/ Plan:  64 y.o. female with   diabetes type 2 with severe complications including diabetic retinopathy, nephropathy, long standing hypertension, COPD, hyperlipidemia, congestive heart failure coronary disease with a history of non-STEMI and PCI 2014, Renal Transplant  03/2020, severe neuropathy, OSA in CPAP admitted on 04/18/2020 for CHF (congestive heart failure) ([place]) [I50.9] Pleural effusion on right [J90] Secondary hypertension [I15.9] Dyspnea, unspecified type [R06.00]  AKI , Renal Transplant Status S/p Deceased donor transplant on 2019/04/04 ESRD presumed secondary to DM Baseline Creatinine 1.4 (range 0.9-1.4)  Immunosuppression Tacrolimus (ENVARSUS XR) 4 mg Tb24 extended release tablet Take 2 tablets (8 mg  total) by mouth every morning Last trough 6.8 on 02/01/2020 Myfortic on hold starting 09/12/19 due to leukopenia and diarrhea H/o post transplant UTI - proteus, citrobater Continue home immunosuppression meds Lab Results  Component Value Date   CREATININE 1.68 (H) 04/20/2020   CREATININE 1.65 (H) 04/19/2020   CREATININE 1.37 (H) 04/18/2020   Acute increase in creatinine is concerning, but Creatinine appears to be stabilizing now Likely related to aggressive diuresis  - Will continue to monitor closely  HTN With h/o postural hypotension and syncope BP was 120's outpatient SBP inpatient running between 140's-160's - Continue carvedilol and amlodipine. Clonidine added on 11/12.  HFpEF  EF 65-70 % from 03/2019; with mod to severe MR  Hyperkalemia Potassium 5.3 --> 5.5 - Lokelma was increased to 10 mg TID for no more than 48 hours - Initiate low-potassium diet   Shortness of breath with severe hypertension Shortness of breath is likely due to elevated blood pressure /volume overload and history of mitral regurgitation leading to pulmonary edema - Diuresing well without lasix. Will continue to hold. - Continue to monitor renal function closely    LOS: 2 Cassandra Gonzalez-Hernandez 11/12/20211:05 PM  Patient was seen and evaluated with [employer] PA student, Haynes Kerns.  She assisted with transcription of the note.  I agree with the note as documented.  Murlean Iba, MD [facility]  11/12/20215:20 PM    Note: This note was prepared with Dragon dictation. Any transcription errors are unintentional

## 2020-04-20 NOTE — TOC Transition Note (Signed)
Transition of Care [facility]) - CM/SW Discharge Note   Patient Details  Name: Theresa [name] MRN: [id number] Date of Birth: 1955/12/02  [facility] CM/SW Contact:  Kerin Salen, RN Phone Number: 04/20/2020, 9:47 AM   Clinical Narrative:   Patient lives at home with son, sister lives nearby and assist with shopping and other needs as requested. Uses cane at home for ambulation and stability. Medications are delivered by [facility] service however also use [facility] pharmacy in [place] if needed. Patient states she cooks for herself, however grocery shopping is done by sister. Uses no home care services. PCP is Dr. Clyde Canterbury at the [facility].    Final next level of care: Home/Self Care Barriers to Discharge: Continued Medical Work up   Patient Goals and CMS Choice Patient states their goals for this hospitalization and ongoing recovery are:: Go home.      Discharge Placement                       Discharge Plan and Services                                     Social Determinants of Health (SDOH) Interventions     Readmission Risk Interventions Readmission Risk Prevention Plan 04/20/2020  Transportation Screening Complete  PCP or Specialist Appt within 5-7 Days Not Complete  Not Complete comments To be scheduled by [place] Screening Complete  Medication Review (RN CM) Complete  Some recent data might be hidden

## 2020-04-20 NOTE — Plan of Care (Signed)

## 2020-04-20 NOTE — Care Management Important Message (Signed)
Important Message  Patient Details  Name: Theresa [name] MRN: [id number] Date of Birth: 02-23-1956   Medicare Important Message Given:  Yes     Dannette Barbara 04/20/2020, 12:18 PM

## 2020-04-20 NOTE — Progress Notes (Addendum)
PROGRESS NOTE    Theresa [name]  DQQ:[id number] DOB: 1955-07-07 DOA: 04/18/2020 PCP: McLean-Scocuzza, Nino Glow, MD  Outpatient Specialists: transplant clinic    Brief Narrative:   Theresa [name] is a 64 y.o. female with medical history significant for chronic diastolic dysfunction CHF, status post recent renal transplant, diabetes mellitus with complications of stage III chronic kidney disease, hypertension who presents to the ER for evaluation of a 3-day history of shortness of breath which has progressively worsened over the last couple of days associated with orthopnea.  Patient states that she had to sit up to be able to sleep because she could not lay down due to shortness of breath.   She has no lower extremity swelling.  She denies having any cough, no fever, no chills, no chest pain, no nausea, no vomiting, no diaphoresis, no palpitation, no abdominal pain, no changes in her bowel habits or any urinary symptoms. She was noted to have significantly elevated blood pressure upon arrival to the ER, 208/74 Labs show sodium 136, potassium 5.6, chloride 107, bicarb 20, glucose 124, BUN 23, creatinine 1.37, calcium 9.5, alkaline phosphatase 98, albumin 3.5, lipase 19, AST 14, ALT 9, total protein 6.8, BNP 657, troponin XI, white count 5.9, hemoglobin 9.1, hematocrit 27.8, MCV 96.5, RDW 15, platelet count 182 Respiratory viral panel is negative CT scan of the chest without contrast shows bilateral pleural effusions and diffuse parenchymal edema consistent with CHF.  Some basilar atelectasis is noted as well. Chest x-ray reviewed by me shows a small right pleural effusion Twelve-lead EKG reviewed by me shows sinus rhythm, LVH and nonspecific ST changes involving the inferior leads.    ED Course: Patient is a 64 year old African-American female who presents to the ER for evaluation of a 3-day history of worsening shortness of breath associated with orthopnea.  Patient's blood pressure was  significantly elevated upon arrival to the ER.  Imaging is consistent with CHF and BNP is elevated.  She will be admitted to the hospital for further evaluation and treatment.   Assessment & Plan:   Principal Problem:   Acute on chronic diastolic CHF (congestive heart failure) (HCC) Active Problems:   Diabetes mellitus, type II (HCC)   Chronic kidney disease (CKD), stage III (moderate) (HCC)   Anemia in chronic kidney disease (CKD)   Kidney transplanted   Hyperkalemia   Hypertensive urgency  # Acute on chronic CHF Also w/ hypertensive urgency. TTE shows preserved EF, grade 2 dd, moderate mitral regurg. CT with b/l pleural effusions and pulm edema consistent w/ chf. BNP 657. - cardiology consulted, appreciate recs - lasix on hold 2/2 AKI - strict I/os and daily weights ordered, net negative 500 yesterday - Maintain low-sodium diet - would sample pleural fluid if pt does not make expected progress w/ diuresis  # Acute hypoxic respiratory failure - O2 87% on room air at rest 11/11 - cont [place] O2 @ 2 L  # Hypertensive urgency Patient was significantly elevated blood pressure upon arrival to the ER. Volume overload likely contributory. BP improved this AM to 157/46 - cont carvedilol, amlodipine  - had previously been treated w/ clonidine, will re-start at 0.1 bid  # Diabetes mellitus - glucose appropriate, a1c wnl, has required minimal SSI - hold ssi, monitor glucose with AM BMPs  # Stage IIIb chronic kidney disease # Acute kidney injury Patient is status post recent renal transplant. Baseline cr 1.4, up to 1.65 11/11, 1.68 today - Continue tacrolimus - nephrology consulted - holding  diuresis  # Hyperkalemia 5.5 this am from 5.3 yesterday. Meds don't appear to be contributing.  - increase lokelma to 10 tid, plan on no more than 48 hours of that - will repeat ekg - cont tele  # Anemia of chronic kidney disease - baseline H 8-9, stable here  # History of coronary artery  disease - Continue carvedilol, aspirin and atorvastatin'  # Chronic pain - cont home oxy, pregabalin   DVT prophylaxis: lovenox Code Status: full Family Communication: daughter updated @ bedside 11/11  Status is: Inpatient  Remains inpatient appropriate because:Inpatient level of care appropriate due to severity of illness   Dispo: The patient is from: Home              Anticipated d/c is to: Home. PT advising home health PT. No OT needs.              Anticipated d/c date is: 2-4 days              Patient currently is not medically stable to d/c.        Consultants:  Nephrology, cardiology  Procedures: none  Antimicrobials:  none    Subjective: Overnight says breathing slightly improved. No cough or fever or chest pain. Has appetite. Did desat to 62 yesterday w/ ambulation off oxygen.   Objective: Vitals:   04/19/20 1630 04/19/20 1945 04/20/20 0413 04/20/20 0819  BP: (!) 148/48 (!) 168/51 (!) 145/53 (!) 157/46  Pulse: 68 65 62 66  Resp: 18 17 17 18   Temp: 97.8 F (36.6 C) 98.1 F (36.7 C) 97.8 F (36.6 C) 97.9 F (36.6 C)  TempSrc:  Oral Oral   SpO2: 98% 100% 98% 93%  Weight:   68.9 kg   Height:        Intake/Output Summary (Last 24 hours) at 04/20/2020 1000 Last data filed at 04/20/2020 0933 Gross per 24 hour  Intake 363 ml  Output 1100 ml  Net -737 ml   Filed Weights   04/18/20 0801 04/19/20 0240 04/20/20 0413  Weight: 69.4 kg 68.5 kg 68.9 kg    Examination:  Constitutional: NAD, alert and oriented x 3.  Chronically ill-appearing Eyes: PERRL, lids and conjunctivae pallor ENMT: Mucous membranes are moist.  Neck: normal, supple, no masses, no thyromegaly Respiratory: decreased @ bases, rales @ bases, no wheeze Cardiovascular: Regular rate and rhythm, mod systolic murmur. No extremity edema. 2+ pedal pulses. No carotid bruits.  Abdomen: no tenderness, no masses palpated. No hepatosplenomegaly. Bowel sounds positive.  Musculoskeletal: no  clubbing / cyanosis. No joint deformity upper and lower extremities.  Skin: no rashes, lesions, ulcers.  Neurologic: No gross focal neurologic deficit. Psychiatric: Normal mood and affect.    Data Reviewed: I have personally reviewed following labs and imaging studies  CBC: Recent Labs  Lab 04/18/20 0802 04/20/20 0552  WBC 5.9 4.5  HGB 9.1* 8.5*  HCT 27.8* 26.1*  MCV 96.5 96.7  PLT 182 326   Basic Metabolic Panel: Recent Labs  Lab 04/18/20 0802 04/18/20 1820 04/19/20 0426 04/20/20 0552  NA 136  --  136 135  K 5.6* 5.3* 5.3* 5.5*  CL 107  --  106 105  CO2 22  --  24 25  GLUCOSE 124*  --  102* 106*  BUN 23  --  25* 24*  CREATININE 1.37*  --  1.65* 1.68*  CALCIUM 9.5  --  9.4 9.5   GFR: Estimated Creatinine Clearance: 34.4 mL/min (A) (by C-G formula based on  SCr of 1.68 mg/dL (H)). Liver Function Tests: Recent Labs  Lab 04/18/20 0802  AST 14*  ALT 9  ALKPHOS 98  BILITOT 1.0  PROT 6.8  ALBUMIN 3.5   Recent Labs  Lab 04/18/20 0802  LIPASE 19   No results for input(s): AMMONIA in the last 168 hours. Coagulation Profile: No results for input(s): INR, PROTIME in the last 168 hours. Cardiac Enzymes: No results for input(s): CKTOTAL, CKMB, CKMBINDEX, TROPONINI in the last 168 hours. BNP (last 3 results) No results for input(s): PROBNP in the last 8760 hours. HbA1C: Recent Labs    04/18/20 1358  HGBA1C 5.0   CBG: Recent Labs  Lab 04/19/20 0804 04/19/20 1111 04/19/20 1628 04/19/20 2023 04/20/20 0818  GLUCAP 95 126* 150* 116* 77   Lipid Profile: No results for input(s): CHOL, HDL, LDLCALC, TRIG, CHOLHDL, LDLDIRECT in the last 72 hours. Thyroid Function Tests: No results for input(s): TSH, T4TOTAL, FREET4, T3FREE, THYROIDAB in the last 72 hours. Anemia Panel: No results for input(s): VITAMINB12, FOLATE, FERRITIN, TIBC, IRON, RETICCTPCT in the last 72 hours. Urine analysis:    Component Value Date/Time   COLORURINE YELLOW (A) 04/19/2020 1640    APPEARANCEUR CLEAR (A) 04/19/2020 1640   APPEARANCEUR Hazy 06/26/2012 2030   LABSPEC 1.011 04/19/2020 1640   LABSPEC 1.010 06/26/2012 2030   PHURINE 7.0 04/19/2020 1640   GLUCOSEU NEGATIVE 04/19/2020 1640   GLUCOSEU 50 mg/dL 06/26/2012 2030   HGBUR NEGATIVE 04/19/2020 1640   BILIRUBINUR NEGATIVE 04/19/2020 1640   BILIRUBINUR Negative 06/26/2012 2030   KETONESUR NEGATIVE 04/19/2020 1640   PROTEINUR NEGATIVE 04/19/2020 1640   NITRITE NEGATIVE 04/19/2020 1640   LEUKOCYTESUR NEGATIVE 04/19/2020 1640   LEUKOCYTESUR Negative 06/26/2012 2030   Sepsis Labs: @LABRCNTIP (procalcitonin:4,lacticidven:4)  ) Recent Results (from the past 240 hour(s))  Respiratory Panel by RT PCR (Flu A&B, Covid) - Nasopharyngeal Swab     Status: None   Collection Time: 04/18/20  8:02 AM   Specimen: Nasopharyngeal Swab  Result Value Ref Range Status   SARS Coronavirus 2 by RT PCR NEGATIVE NEGATIVE Final    Comment: (NOTE) SARS-CoV-2 target nucleic acids are NOT DETECTED.  The SARS-CoV-2 RNA is generally detectable in upper respiratoy specimens during the acute phase of infection. The lowest concentration of SARS-CoV-2 viral copies this assay can detect is 131 copies/mL. A negative result does not preclude SARS-Cov-2 infection and should not be used as the sole basis for treatment or other patient management decisions. A negative result may occur with  improper specimen collection/handling, submission of specimen other than nasopharyngeal swab, presence of viral mutation(s) within the areas targeted by this assay, and inadequate number of viral copies (<131 copies/mL). A negative result must be combined with clinical observations, patient history, and epidemiological information. The expected result is Negative.  Fact Sheet for Patients:  PinkCheek.be  Fact Sheet for Healthcare Providers:  GravelBags.it  This test is no t yet approved or cleared  by the [place] FDA and  has been authorized for detection and/or diagnosis of SARS-CoV-2 by FDA under an Emergency Use Authorization (EUA). This EUA will remain  in effect (meaning this test can be used) for the duration of the COVID-19 declaration under Section 564(b)(1) of the Act, 21 U.S.C. section 360bbb-3(b)(1), unless the authorization is terminated or revoked sooner.     Influenza A by PCR NEGATIVE NEGATIVE Final   Influenza B by PCR NEGATIVE NEGATIVE Final    Comment: (NOTE) The Xpert Xpress SARS-CoV-2/FLU/RSV assay is intended as an aid  in  the diagnosis of influenza from Nasopharyngeal swab specimens and  should not be used as a sole basis for treatment. Nasal washings and  aspirates are unacceptable for Xpert Xpress SARS-CoV-2/FLU/RSV  testing.  Fact Sheet for Patients: PinkCheek.be  Fact Sheet for Healthcare Providers: GravelBags.it  This test is not yet approved or cleared by the [place] FDA and  has been authorized for detection and/or diagnosis of SARS-CoV-2 by  FDA under an Emergency Use Authorization (EUA). This EUA will remain  in effect (meaning this test can be used) for the duration of the  Covid-19 declaration under Section 564(b)(1) of the Act, 21  U.S.C. section 360bbb-3(b)(1), unless the authorization is  terminated or revoked. Performed at [facility], [address] [place], [place] [address]          Radiology Studies: CT CHEST WO CONTRAST  Result Date: 04/18/2020 CLINICAL DATA:  Shortness of breath EXAM: CT CHEST WITHOUT CONTRAST TECHNIQUE: Multidetector CT imaging of the chest was performed following the standard protocol without IV contrast. COMPARISON:  Chest x-ray from earlier in the same day. CT from 01/12/2018 FINDINGS: Cardiovascular: Limited due to lack of IV contrast. Aortic atherosclerotic calcifications are noted without aneurysmal dilatation. Heavy  coronary calcifications are seen. No cardiac enlargement is noted. Left subclavian vein stent is noted. Mediastinum/Nodes: Thoracic inlet is within normal limits. No hilar or mediastinal adenopathy is noted. The esophagus as visualized is within normal limits. Lungs/Pleura: Bilateral moderate to large pleural effusions are noted greater than that expected from recent chest x-ray. Bibasilar atelectatic changes are seen. Some mild generalized edema is noted within the lungs bilaterally consistent with congestive failure. No sizable parenchymal nodule is noted. Upper Abdomen: Visualized upper abdomen is within normal limits. The kidneys are somewhat shrunken consistent with end-stage renal disease. Musculoskeletal: No chest wall mass or suspicious bone lesions identified. IMPRESSION: Bilateral pleural effusions and diffuse parenchymal edema consistent with congestive failure. Some basilar atelectasis is noted as well. Aortic Atherosclerosis (ICD10-I70.0). Electronically Signed   By: Inez Catalina M.D.   On: 04/18/2020 11:33   ECHOCARDIOGRAM COMPLETE  Result Date: 04/19/2020    ECHOCARDIOGRAM REPORT   Patient Name:   Theresa [name] Date of Exam: 04/18/2020 Medical Rec #:  [id number]     Height:       67.5 in Accession #:    [id number]    Weight:       153.0 lb Date of Birth:  Nov 07, 1955     BSA:          1.815 m Patient Age:    80 years      BP:           162/50 mmHg Patient Gender: F             HR:           61 bpm. Exam Location:  [facility] Procedure: 2D Echo, Cardiac Doppler and Color Doppler Indications:     R06.00 Dyspnea  History:         Patient has prior history of Echocardiogram examinations, most                  recent 12/09/2019. Status post kidney transplant; Risk                  Factors:Hypertension, Dyslipidemia, Diabetes and Former Smoker.                  Emphysema. Coronary artery disease. COPD. Peripheral vascular  disease. Myocardial infarction. Pneumonia.  Sonographer:     Wilford Sports  Rodgers-Jones Referring Phys:  [id number] [place] Diagnosing Phys: Ida Rogue MD IMPRESSIONS  1. Left ventricular ejection fraction, by estimation, is 60 to 65%. The left ventricle has normal function. The left ventricle has no regional wall motion abnormalities. There is mild left ventricular hypertrophy. Left ventricular diastolic parameters are consistent with Grade II diastolic dysfunction (pseudonormalization).  2. Right ventricular systolic function is normal. The right ventricular size is normal.  3. Left atrial size was moderately dilated.  4. Moderate mitral valve regurgitation.  5. Aortic valve regurgitation is mild. Mild aortic valve stenosis. FINDINGS  Left Ventricle: Left ventricular ejection fraction, by estimation, is 60 to 65%. The left ventricle has normal function. The left ventricle has no regional wall motion abnormalities. The left ventricular internal cavity size was normal in size. There is  mild left ventricular hypertrophy. Left ventricular diastolic parameters are consistent with Grade II diastolic dysfunction (pseudonormalization). Right Ventricle: The right ventricular size is normal. No increase in right ventricular wall thickness. Right ventricular systolic function is normal. Left Atrium: Left atrial size was moderately dilated. Right Atrium: Right atrial size was normal in size. Pericardium: There is no evidence of pericardial effusion. Mitral Valve: The mitral valve is normal in structure. Moderate mitral valve regurgitation. No evidence of mitral valve stenosis. Tricuspid Valve: The tricuspid valve is normal in structure. Tricuspid valve regurgitation is not demonstrated. No evidence of tricuspid stenosis. Aortic Valve: The aortic valve is normal in structure. Aortic valve regurgitation is mild. Aortic regurgitation PHT measures 273 msec. Mild aortic stenosis is present. Aortic valve mean gradient measures 12.5 mmHg. Aortic valve peak gradient measures 26.3 mmHg. Aortic valve  area, by VTI measures 1.17 cm. Pulmonic Valve: The pulmonic valve was normal in structure. Pulmonic valve regurgitation is not visualized. No evidence of pulmonic stenosis. Aorta: The aortic root is normal in size and structure. Venous: The inferior vena cava is normal in size with greater than 50% respiratory variability, suggesting right atrial pressure of 3 mmHg. IAS/Shunts: No atrial level shunt detected by color flow Doppler.  LEFT VENTRICLE PLAX 2D LVIDd:         4.21 cm  Diastology LVIDs:         2.73 cm  LV e' medial:    4.68 cm/s LV PW:         1.21 cm  LV E/e' medial:  37.2 LV IVS:        1.20 cm  LV e' lateral:   6.53 cm/s LVOT diam:     1.70 cm  LV E/e' lateral: 26.6 LV SV:         65 LV SV Index:   36 LVOT Area:     2.27 cm  RIGHT VENTRICLE             IVC RV Basal diam:  3.40 cm     IVC diam: 1.48 cm RV S prime:     11.40 cm/s TAPSE (M-mode): 2.4 cm LEFT ATRIUM             Index       RIGHT ATRIUM           Index LA diam:        5.00 cm 2.76 cm/m  RA Area:     13.40 cm LA Vol (A2C):   85.4 ml 47.06 ml/m RA Volume:   30.30 ml  16.70 ml/m LA Vol (A4C):   47.7 ml  26.29 ml/m LA Biplane Vol: 64.1 ml 35.32 ml/m  AORTIC VALVE AV Area (Vmax):    1.11 cm AV Area (Vmean):   1.10 cm AV Area (VTI):     1.17 cm AV Vmax:           256.25 cm/s AV Vmean:          165.500 cm/s AV VTI:            0.558 m AV Peak Grad:      26.3 mmHg AV Mean Grad:      12.5 mmHg LVOT Vmax:         125.00 cm/s LVOT Vmean:        80.200 cm/s LVOT VTI:          0.287 m LVOT/AV VTI ratio: 0.51 AI PHT:            273 msec  AORTA Ao Root diam: 2.70 cm MITRAL VALVE MV Area (PHT): 3.17 cm     SHUNTS MV Decel Time: 239 msec     Systemic VTI:  0.29 m MV E velocity: 174.00 cm/s  Systemic Diam: 1.70 cm MV A velocity: 134.00 cm/s MV E/A ratio:  1.30 Ida Rogue MD Electronically signed by Ida Rogue MD Signature Date/Time: 04/19/2020/4:49:59 PM    Final         Scheduled Meds: . amLODipine  10 mg Oral Daily  . aspirin EC   81 mg Oral QHS  . atorvastatin  40 mg Oral q1800  . carvedilol  12.5 mg Oral BID WC  . enoxaparin (LOVENOX) injection  40 mg Subcutaneous Q24H  . insulin aspart  0-9 Units Subcutaneous TID WC  . magnesium oxide  400 mg Oral Daily  . pregabalin  75 mg Oral BID  . sodium chloride flush  3 mL Intravenous Q12H  . sodium zirconium cyclosilicate  10 g Oral TID  . Tacrolimus ER  8 mg Oral q morning - 10a   Continuous Infusions: . sodium chloride       LOS: 2 days    Time spent: 30 min    Desma Maxim, MD [facility]   If 7PM-7AM, please contact night-coverage [email or web address] Password [email or web address] 04/20/2020, 10:00 AM

## 2020-04-20 NOTE — Progress Notes (Addendum)
   Heart Failure Nurse Navigator Note  HFpEF 60-65%,Mild LVH. Grade II Diastolic Dysfunction.  Right ventricular systolic function.  Moderate MR.  Mild AS.  She presented to the ED with 3 day history of increasing SOB, PND and orthopnea.  Co morbidities:  Hypertension Hyperlipidemia Emphysema Diabetes Coronary artery disease Anemia   Medications:  Norvasc 10 mg daily ASA 81 mg daily Lipitor 40 mg daily Coreg 12.5 mg BID  Labs:  Sodium 135, potassium 5.5(5.3) BUN 24, creatinine 1.68, hemoglobin 8.5 (9.1),  Intake 600 ml Output 1100 ml  BP 157/46 p 66 BMI 23.46 Weight 68.9 kg (68.5)  Assessment:  General- is awake and alert, states she had a good nights sleep, no increasing SOB or chest pain.  HEENT- pupils equal, no JVD  Cardiac- heart tones regular, + systolic murmur  Chest- breath sounds with fine crackles on the right.  Abdomen is soft non tender  Musculoskeletal - no lower extremity edema.  Psych she is pleasant and appropriate, makes good eye contact  Neuro- moves all extremities, speech is clear.   Discussed with patient again how she takes care of herself.  Also talked about how her blood pressures have been running since she is here as a patient.  She states at home she thought they normally run 120/50.  Discussed keeping a diary of weights and daily BP.  Instructed to sit and rest 10 to 15 minutes before checking and record.  To take the diary to the heart failure clinic and also to doctors appointments.  She voices understanding.   Pricilla Riffle RN, CHFN

## 2020-04-20 NOTE — Progress Notes (Signed)
Progress Note  Patient Name: Theresa [name] Date of Encounter: 04/20/2020  Primary Cardiologist: Ida Rogue, MD   Subjective   Breathing status improving but still not yet at baseline, particularly with exertion.  No CP, racing heart rate, palpitations.  Inpatient Medications    Scheduled Meds: . amLODipine  10 mg Oral Daily  . aspirin EC  81 mg Oral QHS  . atorvastatin  40 mg Oral q1800  . carvedilol  12.5 mg Oral BID WC  . enoxaparin (LOVENOX) injection  40 mg Subcutaneous Q24H  . insulin aspart  0-9 Units Subcutaneous TID WC  . magnesium oxide  400 mg Oral Daily  . pregabalin  75 mg Oral BID  . sodium chloride flush  3 mL Intravenous Q12H  . sodium zirconium cyclosilicate  10 g Oral Daily  . Tacrolimus ER  8 mg Oral q morning - 10a   Continuous Infusions: . sodium chloride     PRN Meds: sodium chloride, acetaminophen, albuterol, nitroGLYCERIN, ondansetron (ZOFRAN) IV, oxyCODONE-acetaminophen, sodium chloride flush, zolpidem   Vital Signs    Vitals:   04/19/20 1630 04/19/20 1945 04/20/20 0413 04/20/20 0819  BP: (!) 148/48 (!) 168/51 (!) 145/53 (!) 157/46  Pulse: 68 65 62 66  Resp: 18 17 17 18   Temp: 97.8 F (36.6 C) 98.1 F (36.7 C) 97.8 F (36.6 C) 97.9 F (36.6 C)  TempSrc:  Oral Oral   SpO2: 98% 100% 98% 93%  Weight:   68.9 kg   Height:        Intake/Output Summary (Last 24 hours) at 04/20/2020 0937 Last data filed at 04/20/2020 0933 Gross per 24 hour  Intake 603 ml  Output 1100 ml  Net -497 ml   Last 3 Weights 04/20/2020 04/19/2020 04/18/2020  Weight (lbs) 152 lb 151 lb 1.6 oz 153 lb  Weight (kg) 68.947 kg 68.539 kg 69.4 kg      Telemetry    SR- Personally Reviewed  ECG    No new tracings- Personally Reviewed  Physical Exam   GEN: No acute distress.   Neck: No JVD Cardiac: RRR, 2/6 systolic murmur LSB.  No, rubs, or gallops.  Respiratory:  Bibasilar reduced breath sounds. GI: Soft, nontender, non-distended  MS: No edema; No  deformity. Neuro:  Nonfocal  Psych: Normal affect   Labs    High Sensitivity Troponin:   Recent Labs  Lab 04/18/20 0802 04/18/20 1358 04/18/20 1820  TROPONINIHS 11 11 12       Chemistry Recent Labs  Lab 04/18/20 0802 04/18/20 0802 04/18/20 1820 04/19/20 0426 04/20/20 0552  NA 136  --   --  136 135  K 5.6*   < > 5.3* 5.3* 5.5*  CL 107  --   --  106 105  CO2 22  --   --  24 25  GLUCOSE 124*  --   --  102* 106*  BUN 23  --   --  25* 24*  CREATININE 1.37*  --   --  1.65* 1.68*  CALCIUM 9.5  --   --  9.4 9.5  PROT 6.8  --   --   --   --   ALBUMIN 3.5  --   --   --   --   AST 14*  --   --   --   --   ALT 9  --   --   --   --   ALKPHOS 98  --   --   --   --  BILITOT 1.0  --   --   --   --   GFRNONAA 44*  --   --  35* 34*  ANIONGAP 7  --   --  6 5   < > = values in this interval not displayed.     Hematology Recent Labs  Lab 04/18/20 0802 04/20/20 0552  WBC 5.9 4.5  RBC 2.88* 2.70*  HGB 9.1* 8.5*  HCT 27.8* 26.1*  MCV 96.5 96.7  MCH 31.6 31.5  MCHC 32.7 32.6  RDW 15.0 15.1  PLT 182 174    BNP Recent Labs  Lab 04/18/20 0802  BNP 657.1*     DDimer No results for input(s): DDIMER in the last 168 hours.   Radiology    CT CHEST WO CONTRAST  Result Date: 04/18/2020 CLINICAL DATA:  Shortness of breath EXAM: CT CHEST WITHOUT CONTRAST TECHNIQUE: Multidetector CT imaging of the chest was performed following the standard protocol without IV contrast. COMPARISON:  Chest x-ray from earlier in the same day. CT from 01/12/2018 FINDINGS: Cardiovascular: Limited due to lack of IV contrast. Aortic atherosclerotic calcifications are noted without aneurysmal dilatation. Heavy coronary calcifications are seen. No cardiac enlargement is noted. Left subclavian vein stent is noted. Mediastinum/Nodes: Thoracic inlet is within normal limits. No hilar or mediastinal adenopathy is noted. The esophagus as visualized is within normal limits. Lungs/Pleura: Bilateral moderate to large  pleural effusions are noted greater than that expected from recent chest x-ray. Bibasilar atelectatic changes are seen. Some mild generalized edema is noted within the lungs bilaterally consistent with congestive failure. No sizable parenchymal nodule is noted. Upper Abdomen: Visualized upper abdomen is within normal limits. The kidneys are somewhat shrunken consistent with end-stage renal disease. Musculoskeletal: No chest wall mass or suspicious bone lesions identified. IMPRESSION: Bilateral pleural effusions and diffuse parenchymal edema consistent with congestive failure. Some basilar atelectasis is noted as well. Aortic Atherosclerosis (ICD10-I70.0). Electronically Signed   By: Inez Catalina M.D.   On: 04/18/2020 11:33   ECHOCARDIOGRAM COMPLETE  Result Date: 04/19/2020    ECHOCARDIOGRAM REPORT   Patient Name:   Theresa [name] Date of Exam: 04/18/2020 Medical Rec #:  [id number]     Height:       67.5 in Accession #:    [id number]    Weight:       153.0 lb Date of Birth:  1956/03/23     BSA:          1.815 m Patient Age:    64 years      BP:           162/50 mmHg Patient Gender: F             HR:           61 bpm. Exam Location:  [facility] Procedure: 2D Echo, Cardiac Doppler and Color Doppler Indications:     R06.00 Dyspnea  History:         Patient has prior history of Echocardiogram examinations, most                  recent 12/09/2019. Status post kidney transplant; Risk                  Factors:Hypertension, Dyslipidemia, Diabetes and Former Smoker.                  Emphysema. Coronary artery disease. COPD. Peripheral vascular  disease. Myocardial infarction. Pneumonia.  Sonographer:     Wilford Sports Rodgers-Jones Referring Phys:  [id number] [place] Diagnosing Phys: Ida Rogue MD IMPRESSIONS  1. Left ventricular ejection fraction, by estimation, is 60 to 65%. The left ventricle has normal function. The left ventricle has no regional wall motion abnormalities. There is mild left ventricular  hypertrophy. Left ventricular diastolic parameters are consistent with Grade II diastolic dysfunction (pseudonormalization).  2. Right ventricular systolic function is normal. The right ventricular size is normal.  3. Left atrial size was moderately dilated.  4. Moderate mitral valve regurgitation.  5. Aortic valve regurgitation is mild. Mild aortic valve stenosis. FINDINGS  Left Ventricle: Left ventricular ejection fraction, by estimation, is 60 to 65%. The left ventricle has normal function. The left ventricle has no regional wall motion abnormalities. The left ventricular internal cavity size was normal in size. There is  mild left ventricular hypertrophy. Left ventricular diastolic parameters are consistent with Grade II diastolic dysfunction (pseudonormalization). Right Ventricle: The right ventricular size is normal. No increase in right ventricular wall thickness. Right ventricular systolic function is normal. Left Atrium: Left atrial size was moderately dilated. Right Atrium: Right atrial size was normal in size. Pericardium: There is no evidence of pericardial effusion. Mitral Valve: The mitral valve is normal in structure. Moderate mitral valve regurgitation. No evidence of mitral valve stenosis. Tricuspid Valve: The tricuspid valve is normal in structure. Tricuspid valve regurgitation is not demonstrated. No evidence of tricuspid stenosis. Aortic Valve: The aortic valve is normal in structure. Aortic valve regurgitation is mild. Aortic regurgitation PHT measures 273 msec. Mild aortic stenosis is present. Aortic valve mean gradient measures 12.5 mmHg. Aortic valve peak gradient measures 26.3 mmHg. Aortic valve area, by VTI measures 1.17 cm. Pulmonic Valve: The pulmonic valve was normal in structure. Pulmonic valve regurgitation is not visualized. No evidence of pulmonic stenosis. Aorta: The aortic root is normal in size and structure. Venous: The inferior vena cava is normal in size with greater than 50%  respiratory variability, suggesting right atrial pressure of 3 mmHg. IAS/Shunts: No atrial level shunt detected by color flow Doppler.  LEFT VENTRICLE PLAX 2D LVIDd:         4.21 cm  Diastology LVIDs:         2.73 cm  LV e' medial:    4.68 cm/s LV PW:         1.21 cm  LV E/e' medial:  37.2 LV IVS:        1.20 cm  LV e' lateral:   6.53 cm/s LVOT diam:     1.70 cm  LV E/e' lateral: 26.6 LV SV:         65 LV SV Index:   36 LVOT Area:     2.27 cm  RIGHT VENTRICLE             IVC RV Basal diam:  3.40 cm     IVC diam: 1.48 cm RV S prime:     11.40 cm/s TAPSE (M-mode): 2.4 cm LEFT ATRIUM             Index       RIGHT ATRIUM           Index LA diam:        5.00 cm 2.76 cm/m  RA Area:     13.40 cm LA Vol (A2C):   85.4 ml 47.06 ml/m RA Volume:   30.30 ml  16.70 ml/m LA Vol (A4C):   47.7 ml  26.29 ml/m LA Biplane Vol: 64.1 ml 35.32 ml/m  AORTIC VALVE AV Area (Vmax):    1.11 cm AV Area (Vmean):   1.10 cm AV Area (VTI):     1.17 cm AV Vmax:           256.25 cm/s AV Vmean:          165.500 cm/s AV VTI:            0.558 m AV Peak Grad:      26.3 mmHg AV Mean Grad:      12.5 mmHg LVOT Vmax:         125.00 cm/s LVOT Vmean:        80.200 cm/s LVOT VTI:          0.287 m LVOT/AV VTI ratio: 0.51 AI PHT:            273 msec  AORTA Ao Root diam: 2.70 cm MITRAL VALVE MV Area (PHT): 3.17 cm     SHUNTS MV Decel Time: 239 msec     Systemic VTI:  0.29 m MV E velocity: 174.00 cm/s  Systemic Diam: 1.70 cm MV A velocity: 134.00 cm/s MV E/A ratio:  1.30 Ida Rogue MD Electronically signed by Ida Rogue MD Signature Date/Time: 04/19/2020/4:49:59 PM    Final     Cardiac Studies   Echo 04/18/20 1. Left ventricular ejection fraction, by estimation, is 60 to 65%. The  left ventricle has normal function. The left ventricle has no regional  wall motion abnormalities. There is mild left ventricular hypertrophy.  Left ventricular diastolic parameters  are consistent with Grade II diastolic dysfunction (pseudonormalization).    2. Right ventricular systolic function is normal. The right ventricular  size is normal.  3. Left atrial size was moderately dilated.  4. Moderate mitral valve regurgitation.  5. Aortic valve regurgitation is mild. Mild aortic valve stenosis.   2D echo 12/2019: 1. Left ventricular ejection fraction, by estimation, is 60 to 65%. The  left ventricle has normal function. The left ventricle has no regional  wall motion abnormalities. There is moderate left ventricular hypertrophy.  Left ventricular diastolic  parameters are consistent with Grade II diastolic dysfunction  (pseudonormalization).  2. Right ventricular systolic function is normal. The right ventricular  size is normal.  3. Left atrial size was mildly dilated.  4. The mitral valve is normal in structure. Moderate mitral valve  regurgitation.  5. The aortic valve is normal in structure. Aortic valve regurgitation is  mild to moderate. __________  2D echo this admission: -Pending  Patient Profile     64 y.o. female with history of CAD s/p RCA stenting, moderate MR, HFpEF, ESRD presumed secondary to diabetic neuropathy previously on HD s/p deceased donor renal transplant on 03/30/2019, COPD, PVD, carotid artery disease, DM2 with diabetic neuropathy and retinopathy, sickle cell trait, hypertension, hyperlipidemia, lumbar radiculopathy, thyroid nodule, esophageal stenosis with history of esophageal ulcers s/p dilation, and remote tobacco use who is being seen today for ongoing management of chronic HFrEF.  Assessment & Plan    Acute on chronic HFrEF/pleural effusions --Improved breathing status but still short of breath at times, particularly with exertion.  Diuresis has been limited by renal function.  --Updated echo EF 60 to 65%, mild LVH, G2DD, LAE, moderate MR, mild AR/AS. --Daily weights.  68.5kg  68.9kg. --I/os. -500ccc.  --Daily BMET. Cr 1.65  1.68. BUN 25  24. --IV diuresis was discontinued yesterday 2/2  worsening renal function. --Cr still elevated today. --Nephrology  has been consulted.  Further recommendations per nephrology. --Current renal function precludes addition of ACE/ARB.  CAD without angina --No reported chest pain. --High-sensitivity troponin negative x2. --EKG without acute ST/T changes. --Not consistent with ACS. --No plans for inpatient ischemic evaluation. --Continue medical management with ASA, Coreg, Lipitor, PRN SL nitro.  Moderate mitral regurgitation --Consider as contributing to her breathing status. --Repeat echo. --Could consider TEE in the future for better view of valve.  ESRD s/p renal transplant/hyperkalemia --Per IM/nephrology. --Potassium still elevated at 5.5. --Placed on Lokelma daily. --Monitor closely with diuresis.  Accelerated hypertension --Continue medical management.   --Titrate antihypertensives as heart rate allows.   --Amlodipine 10 mg daily was added yesterday.   --Continue with carvedilol. --Started on clonidine 0.1 mg for additional BP support.  Continue. --Current renal function precludes addition of ACE/ARB.  HLD --Continue statin.  For questions or updates, please contact [place] Please consult [email or web address] for contact info under        Signed, Arvil Chaco, PA-C  04/20/2020, 9:37 AM

## 2020-04-20 NOTE — Progress Notes (Signed)
Mobility Specialist - Progress Note   04/20/20 1600  Mobility  Activity Ambulated in room  Range of Motion/Exercises Right leg;Left leg (trandem gait, mini squats, hip abduction)  Level of Assistance Modified independent, requires aide device or extra time  Assistive Device None  Distance Ambulated (ft) 30 ft  Mobility Response Tolerated well  Mobility performed by Mobility specialist  $Mobility charge 1 Mobility    Pre-mobility: 70 HR, 97% SpO2 During mobility: 72 HR, 90% SpO2 Post-mobility: 73 HR, 96% SpO2   Pt was lying in bed upon arrival utilizing 2L [place] O2. Pt agreed to session. Pt c/o pain in back, but was not limited for mobility. Pt was able to get EOB and stand independently. Pt ambulated 10' forward without assistance and 10' backward x3. Pt required 2-hand assist for retro stepping. Pt progressed to tandem gait with 2-hand assist, VC needed for proper technique. Pt performed standing hip abductions and mini squats SBA utilizing furniture. Overall, pt tolerated session well. Pt was left in bed with all needs in reach.    Kathee Delton Mobility Specialist 04/20/20, 4:16 PM

## 2020-04-21 DIAGNOSIS — J90 Pleural effusion, not elsewhere classified: Secondary | ICD-10-CM

## 2020-04-21 DIAGNOSIS — I5033 Acute on chronic diastolic (congestive) heart failure: Secondary | ICD-10-CM | POA: Diagnosis not present

## 2020-04-21 DIAGNOSIS — N1831 Chronic kidney disease, stage 3a: Secondary | ICD-10-CM | POA: Diagnosis not present

## 2020-04-21 DIAGNOSIS — R0600 Dyspnea, unspecified: Secondary | ICD-10-CM

## 2020-04-21 DIAGNOSIS — N1832 Chronic kidney disease, stage 3b: Secondary | ICD-10-CM | POA: Diagnosis not present

## 2020-04-21 DIAGNOSIS — E1122 Type 2 diabetes mellitus with diabetic chronic kidney disease: Secondary | ICD-10-CM | POA: Diagnosis not present

## 2020-04-21 LAB — BASIC METABOLIC PANEL
Anion gap: 6 (ref 5–15)
BUN: 23 mg/dL (ref 8–23)
CO2: 23 mmol/L (ref 22–32)
Calcium: 9.2 mg/dL (ref 8.9–10.3)
Chloride: 105 mmol/L (ref 98–111)
Creatinine, Ser: 1.39 mg/dL — ABNORMAL HIGH (ref 0.44–1.00)
GFR, Estimated: 43 mL/min — ABNORMAL LOW (ref >60)
Glucose, Bld: 176 mg/dL — ABNORMAL HIGH (ref 70–99)
Potassium: 4.6 mmol/L (ref 3.5–5.1)
Sodium: 134 mmol/L — ABNORMAL LOW (ref 135–145)

## 2020-04-21 LAB — FERRITIN: Ferritin: 488 ng/mL — ABNORMAL HIGH (ref 11–307)

## 2020-04-21 LAB — GLUCOSE, CAPILLARY
Glucose-Capillary: 164 mg/dL — ABNORMAL HIGH (ref 70–99)
Glucose-Capillary: 167 mg/dL — ABNORMAL HIGH (ref 70–99)

## 2020-04-21 LAB — IRON AND TIBC
Iron: 54 ug/dL (ref 28–170)
Saturation Ratios: 25 % (ref 10.4–31.8)
TIBC: 213 ug/dL — ABNORMAL LOW (ref 250–450)
UIBC: 159 ug/dL

## 2020-04-21 MED ORDER — AMLODIPINE BESYLATE 10 MG PO TABS
10.0000 mg | ORAL_TABLET | Freq: Every day | ORAL | 1 refills | Status: DC
Start: 2020-04-22 — End: 2021-01-11

## 2020-04-21 MED ORDER — SODIUM ZIRCONIUM CYCLOSILICATE 10 G PO PACK: 10.0000 g | PACK | Freq: Every day | ORAL | Status: DC

## 2020-04-21 MED ORDER — CARVEDILOL 12.5 MG PO TABS: 12.5000 mg | ORAL_TABLET | Freq: Two times a day (BID) | ORAL | 1 refills | Status: DC

## 2020-04-21 MED ORDER — SODIUM ZIRCONIUM CYCLOSILICATE 10 G PO PACK: 10.0000 g | PACK | Freq: Every day | ORAL | 1 refills | Status: DC

## 2020-04-21 MED ORDER — CLONIDINE HCL 0.1 MG PO TABS
0.1000 mg | ORAL_TABLET | Freq: Two times a day (BID) | ORAL | 1 refills | Status: DC
Start: 2020-04-21 — End: 2020-12-07

## 2020-04-21 NOTE — Plan of Care (Signed)

## 2020-04-21 NOTE — Progress Notes (Signed)
Mobility Specialist - Progress Note   04/21/20 1300  Mobility  Activity Ambulated in room  Level of Assistance Modified independent, requires aide device or extra time  Assistive Device None  Distance Ambulated (ft) 60 ft  Mobility Response Tolerated well  Mobility performed by Mobility specialist  $Mobility charge 1 Mobility    Pre-mobility: 67 HR, 94% SpO2 During mobility: 70 HR, 90% SpO2 Post-mobility: 69 HR, 94% SpO2   Pt was lying in bed upon arrival utilizing room air. Pt agreed to session. Pt denied any pain, nausea, or fatigue. Pt was modI in all transfers this date, including ambulation. Pt ambulated 60' in room with no LOB noted. Pt denied weakness or dizziness throughout session. O2 desat to 90%, pt denied SOB. Upon return to bed, PLB technique combined with rest increased O2 sats to 94%. Overall, pt tolerated session well. Pt was left in bed with all needs in reach.    Kathee Delton Mobility Specialist 04/21/20, 1:41 PM

## 2020-04-21 NOTE — Progress Notes (Signed)
SATURATION QUALIFICATIONS: (This note is used to comply with regulatory documentation for home oxygen)  Patient Saturations on Room Air at Rest = 94%  Patient Saturations on Room Air while Ambulating = 92%  Patient Saturations on n/a Liters of oxygen while Ambulating = N/A  Please briefly explain why patient needs home oxygen: Pt does not qualify for home oxygen based on this assessment  Justice Britain, RN 04/21/2020 11:32 AM

## 2020-04-21 NOTE — Discharge Summary (Signed)
Theresa [name]:[id number] DOB: [birth year] DOA: 04/18/2020  PCP: McLean-Scocuzza, Nino Glow, MD  Admit date: 04/18/2020 Discharge date: 04/21/2020  Time spent: 35 minutes  Recommendations for Outpatient Follow-up:  1. Nephrology f/u as scheduled 2. Cardiology f/u [facility]) in 1-2 weeks 3. Has heart failure clinic f/u 11/23 4. Will need assessment of kidney function and electrolytes within 1-2 weeks    Discharge Diagnoses:  Principal Problem:   Acute on chronic diastolic CHF (congestive heart failure) (HCC) Active Problems:   Diabetes mellitus, type II (HCC)   Chronic kidney disease (CKD), stage III (moderate) (HCC)   Anemia in chronic kidney disease (CKD)   Kidney transplanted   Hyperkalemia   Hypertensive urgency   Discharge Condition: fair  Diet recommendation: heart healthy low sodium  Filed Weights   04/19/20 0240 04/20/20 0413 04/21/20 0600  Weight: 68.5 kg 68.9 kg 69.1 kg    History of present illness:  Theresa [name] a 64 y.o.femalewith medical history significant forchronic diastolic dysfunction CHF, status post recent renal transplant,diabetes mellitus with complications of stage III chronic kidney disease, hypertension who presents to the ER for evaluation of a 3-day history of shortness of breath which has progressively worsened over the last couple of days associated with orthopnea. Patient states that she had to sit up to be able to sleep because she could not lay down due to shortness of breath.  She has no lower extremity swelling. She denies having any cough, no fever, no chills, no chest pain, no nausea, no vomiting, no diaphoresis, no palpitation, no abdominal pain, no changes in her bowel habits or any urinary symptoms. She was noted to have significantly elevated blood pressure upon arrival to the ER, 208/74 Labs show sodium 136, potassium 5.6, chloride 107, bicarb 20, glucose 124, BUN 23, creatinine 1.37, calcium 9.5, alkaline phosphatase 98,  albumin 3.5, lipase 19, AST 14, ALT 9, total protein 6.8, BNP 657, troponin XI, white count 5.9, hemoglobin 9.1, hematocrit 27.8, MCV 96.5, RDW 15, platelet count 182 Respiratory viral panel is negative CT scan of the chest without contrast shows bilateral pleural effusions and diffuse parenchymal edema consistent with CHF. Some basilar atelectasis is noted as well. Chest x-ray reviewed by me shows a small right pleural effusion Twelve-lead EKG reviewed by me shows sinus rhythm, LVH and nonspecific ST changes involving the inferiorleads.  Hospital Course:  # Acute on chronic CHF Also w/ hypertensive urgency. Likely flash pulmonary edema, perhaps 2/2 htn, overall not fluid overloaded. TTE shows preserved EF, grade 2 dd, moderate mitral regurg. CT with b/l pleural effusions and pulm edema consistent w/ chf. BNP 657. - cardiology consulted, appreciate recs - lasix held 2/2 AKI - will not be discharged on diuretic, will plan on close f/u with cardiology  # Acute hypoxic respiratory failure - resolved - attention to O2 @ f/u  # Hypertensive urgency Patient was significantly elevated blood pressure upon arrival to the ER. Relatively recently home clonidine and amlodipine had been stopped - continued home carvedilol, added back amlodipine 10, and re-started clonidine @ 0.1 bid - will need attention to BP @ f/u  # Diabetes mellitus - glucose appropriate, a1c wnl, required minimal SSI  # Stage IIIb chronic kidney disease # Acute kidney injury Patient is status post recent renal transplant. Baseline cr 1.4, up to 1.65 11/11, back to baseline day of discharge - Continue tacrolimus - outpatient nephrology f/u   # Hyperkalemia Elevated on admission, resolved on day of discharge w/ lokelma - continue lokelma,  will need assessment of potassium at f/u  # Anemiaof chronic kidney disease - baseline H 8-9, stable here  # History of coronary artery disease - Continued carvedilol, aspirin  and atorvastatin'  # Chronic pain - continued home oxy, pregabalin  Procedures:  none   Consultations:  Cardiology, nephrology  Discharge Exam: Vitals:   04/20/20 2040 04/21/20 0600  BP: (!) 155/50 (!) 157/52  Pulse: 69 60  Resp: 18 15  Temp: 98.6 F (37 C) 97.8 F (36.6 C)  SpO2: 100% 100%    Constitutional:NAD, alert and oriented x 3.Chronically ill-appearing Eyes:PERRL, lids and conjunctivae pallor ENMT:Mucous membranes are moist.  Respiratory:decreased @ bases, rales @ bases, no wheeze Cardiovascular:Regular rate and rhythm, mod systolic murmur. No extremity edema. 2+ pedal pulses. No carotid bruits.  Abdomen:no tenderness, no masses palpated. No hepatosplenomegaly. Bowel sounds positive.  Musculoskeletal:no clubbing / cyanosis. No joint deformity upper and lower extremities.  Skin:no rashes, lesions, ulcers.  Neurologic:No gross focal neurologic deficit. Psychiatric:Normal mood and affect.  Discharge Instructions   Discharge Instructions    (HEART FAILURE PATIENTS) Call MD:  Anytime you have any of the following symptoms: 1) 3 pound weight gain in 24 hours or 5 pounds in 1 week 2) shortness of breath, with or without a dry hacking cough 3) swelling in the hands, feet or stomach 4) if you have to sleep on extra pillows at night in order to breathe.   Complete by: As directed    Call MD for:  difficulty breathing, headache or visual disturbances   Complete by: As directed    Call MD for:  extreme fatigue   Complete by: As directed    Call MD for:  persistant dizziness or light-headedness   Complete by: As directed    Call MD for:  persistant nausea and vomiting   Complete by: As directed    Call MD for:  redness, tenderness, or signs of infection (pain, swelling, redness, odor or green/yellow discharge around incision site)   Complete by: As directed    Call MD for:  temperature >100.4   Complete by: As directed    Diet - low sodium heart healthy    Complete by: As directed    Increase activity slowly   Complete by: As directed      Allergies as of 04/21/2020      Reactions   Hydrocodone Hives   Metformin Diarrhea, Other (See Comments)   Other reaction(s): Distress (finding)   Lisinopril    Unknown reaction       Medication List    STOP taking these medications   mycophenolate 360 MG Tbec EC tablet Commonly known as: MYFORTIC   oxyCODONE-acetaminophen 5-325 MG tablet Commonly known as: Percocet   pramipexole 0.125 MG tablet Commonly known as: MIRAPEX   valGANciclovir 450 MG tablet Commonly known as: VALCYTE     TAKE these medications   acetaminophen 500 MG tablet Commonly known as: TYLENOL Take 1,000 mg by mouth 2 (two) times daily.   albuterol (2.5 MG/3ML) 0.083% nebulizer solution Commonly known as: PROVENTIL Take 3 mLs (2.5 mg total) by nebulization every 6 (six) hours as needed for wheezing or shortness of breath.   albuterol 108 (90 Base) MCG/ACT inhaler Commonly known as: Ventolin HFA Inhale 1-2 puffs into the lungs every 6 (six) hours as needed for wheezing or shortness of breath.   amLODipine 10 MG tablet Commonly known as: NORVASC Take 1 tablet (10 mg total) by mouth daily. Start taking on: April 22, 2020   aspirin EC 81 MG tablet Take 1 tablet (81 mg total) by mouth at bedtime.   atorvastatin 20 MG tablet Commonly known as: LIPITOR Take 20 mg by mouth daily.   carvedilol 12.5 MG tablet Commonly known as: COREG Take 1 tablet (12.5 mg total) by mouth 2 (two) times daily with a meal. What changed: Another medication with the same name was removed. Continue taking this medication, and follow the directions you see here.   cetirizine 5 MG tablet Commonly known as: ZyrTEC Allergy Take 1 tablet (5 mg total) by mouth daily as needed for allergies.   cloNIDine 0.1 MG tablet Commonly known as: CATAPRES Take 1 tablet (0.1 mg total) by mouth 2 (two) times daily.   ergocalciferol 1.25 MG  (50000 UT) capsule Commonly known as: VITAMIN D2 Take 50,000 Units by mouth once a week.   esomeprazole 20 MG capsule Commonly known as: NEXIUM Take 20 mg by mouth daily.   magnesium oxide 400 MG tablet Commonly known as: MAG-OX Take 400 mg by mouth 2 (two) times daily.   nitroGLYCERIN 0.4 MG SL tablet Commonly known as: Nitrostat Place 1 tablet (0.4 mg total) under the tongue every 5 (five) minutes as needed for chest pain.   pregabalin 75 MG capsule Commonly known as: LYRICA Take 1 capsule (75 mg total) by mouth 2 (two) times daily.   sodium bicarbonate 650 MG tablet Take 650 mg by mouth 2 (two) times daily.   sodium zirconium cyclosilicate 10 g Pack packet Commonly known as: LOKELMA Take 10 g by mouth daily. Start taking on: April 22, 2020   Tacrolimus ER 4 MG Tb24 Take 8 mg by mouth every morning.   UNABLE TO FIND TEST BLOOD SUGARS 2 TIMES DAILY      Allergies  Allergen Reactions  . Hydrocodone Hives  . Metformin Diarrhea and Other (See Comments)    Other reaction(s): Distress (finding)  . Lisinopril     Unknown reaction     Follow-up Information    [facility] Follow up on 05/01/2020.   Specialty: Cardiology Why: at 12:30pm. Enter through the [place] entrance Contact information: [place] [phone]       Minna Merritts, MD. Schedule an appointment as soon as possible for a visit.   Specialty: Cardiology Contact information: [place] [id number] [phone]                The results of significant diagnostics from this hospitalization (including imaging, microbiology, ancillary and laboratory) are listed below for reference.    Significant Diagnostic Studies: DG Chest 2 View  Result Date: 04/18/2020 CLINICAL DATA:  Shortness of breath EXAM: CHEST - 2 VIEW COMPARISON:  February 23, 2018 FINDINGS:  There is a small right pleural effusion. There is no appreciable edema or airspace opacity. Heart is upper normal in size with pulmonary vascularity normal. There is aortic atherosclerosis. There is a subclavian region stent on the left. No bone lesions. IMPRESSION: Small right pleural effusion. No edema or airspace opacity. Heart upper normal in size. Aortic Atherosclerosis (ICD10-I70.0). Electronically Signed   By: Lowella Grip III M.D.   On: 04/18/2020 08:39   CT CHEST WO CONTRAST  Result Date: 04/18/2020 CLINICAL DATA:  Shortness of breath EXAM: CT CHEST WITHOUT CONTRAST TECHNIQUE: Multidetector CT imaging of the chest was performed following the standard protocol without IV contrast. COMPARISON:  Chest  x-ray from earlier in the same day. CT from 01/12/2018 FINDINGS: Cardiovascular: Limited due to lack of IV contrast. Aortic atherosclerotic calcifications are noted without aneurysmal dilatation. Heavy coronary calcifications are seen. No cardiac enlargement is noted. Left subclavian vein stent is noted. Mediastinum/Nodes: Thoracic inlet is within normal limits. No hilar or mediastinal adenopathy is noted. The esophagus as visualized is within normal limits. Lungs/Pleura: Bilateral moderate to large pleural effusions are noted greater than that expected from recent chest x-ray. Bibasilar atelectatic changes are seen. Some mild generalized edema is noted within the lungs bilaterally consistent with congestive failure. No sizable parenchymal nodule is noted. Upper Abdomen: Visualized upper abdomen is within normal limits. The kidneys are somewhat shrunken consistent with end-stage renal disease. Musculoskeletal: No chest wall mass or suspicious bone lesions identified. IMPRESSION: Bilateral pleural effusions and diffuse parenchymal edema consistent with congestive failure. Some basilar atelectasis is noted as well. Aortic Atherosclerosis (ICD10-I70.0). Electronically Signed   By: Inez Catalina M.D.   On:  04/18/2020 11:33   ECHOCARDIOGRAM COMPLETE  Result Date: 04/19/2020    ECHOCARDIOGRAM REPORT   Patient Name:   ARANZA GEDDES Date of Exam: 04/18/2020 Medical Rec #:  [id number]     Height:       67.5 in Accession #:    [id number]    Weight:       153.0 lb Date of Birth:  11-23-1955     BSA:          1.815 m Patient Age:    28 years      BP:           162/50 mmHg Patient Gender: F             HR:           61 bpm. Exam Location:  [facility] Procedure: 2D Echo, Cardiac Doppler and Color Doppler Indications:     R06.00 Dyspnea  History:         Patient has prior history of Echocardiogram examinations, most                  recent 12/09/2019. Status post kidney transplant; Risk                  Factors:Hypertension, Dyslipidemia, Diabetes and Former Smoker.                  Emphysema. Coronary artery disease. COPD. Peripheral vascular                  disease. Myocardial infarction. Pneumonia.  Sonographer:     Wilford Sports Rodgers-Jones Referring Phys:  [id number] [place] Diagnosing Phys: Ida Rogue MD IMPRESSIONS  1. Left ventricular ejection fraction, by estimation, is 60 to 65%. The left ventricle has normal function. The left ventricle has no regional wall motion abnormalities. There is mild left ventricular hypertrophy. Left ventricular diastolic parameters are consistent with Grade II diastolic dysfunction (pseudonormalization).  2. Right ventricular systolic function is normal. The right ventricular size is normal.  3. Left atrial size was moderately dilated.  4. Moderate mitral valve regurgitation.  5. Aortic valve regurgitation is mild. Mild aortic valve stenosis. FINDINGS  Left Ventricle: Left ventricular ejection fraction, by estimation, is 60 to 65%. The left ventricle has normal function. The left ventricle has no regional wall motion abnormalities. The left ventricular internal cavity size was normal in size. There is  mild left ventricular hypertrophy. Left ventricular diastolic parameters are consistent  with Grade II diastolic  dysfunction (pseudonormalization). Right Ventricle: The right ventricular size is normal. No increase in right ventricular wall thickness. Right ventricular systolic function is normal. Left Atrium: Left atrial size was moderately dilated. Right Atrium: Right atrial size was normal in size. Pericardium: There is no evidence of pericardial effusion. Mitral Valve: The mitral valve is normal in structure. Moderate mitral valve regurgitation. No evidence of mitral valve stenosis. Tricuspid Valve: The tricuspid valve is normal in structure. Tricuspid valve regurgitation is not demonstrated. No evidence of tricuspid stenosis. Aortic Valve: The aortic valve is normal in structure. Aortic valve regurgitation is mild. Aortic regurgitation PHT measures 273 msec. Mild aortic stenosis is present. Aortic valve mean gradient measures 12.5 mmHg. Aortic valve peak gradient measures 26.3 mmHg. Aortic valve area, by VTI measures 1.17 cm. Pulmonic Valve: The pulmonic valve was normal in structure. Pulmonic valve regurgitation is not visualized. No evidence of pulmonic stenosis. Aorta: The aortic root is normal in size and structure. Venous: The inferior vena cava is normal in size with greater than 50% respiratory variability, suggesting right atrial pressure of 3 mmHg. IAS/Shunts: No atrial level shunt detected by color flow Doppler.  LEFT VENTRICLE PLAX 2D LVIDd:         4.21 cm  Diastology LVIDs:         2.73 cm  LV e' medial:    4.68 cm/s LV PW:         1.21 cm  LV E/e' medial:  37.2 LV IVS:        1.20 cm  LV e' lateral:   6.53 cm/s LVOT diam:     1.70 cm  LV E/e' lateral: 26.6 LV SV:         65 LV SV Index:   36 LVOT Area:     2.27 cm  RIGHT VENTRICLE             IVC RV Basal diam:  3.40 cm     IVC diam: 1.48 cm RV S prime:     11.40 cm/s TAPSE (M-mode): 2.4 cm LEFT ATRIUM             Index       RIGHT ATRIUM           Index LA diam:        5.00 cm 2.76 cm/m  RA Area:     13.40 cm LA Vol (A2C):   85.4  ml 47.06 ml/m RA Volume:   30.30 ml  16.70 ml/m LA Vol (A4C):   47.7 ml 26.29 ml/m LA Biplane Vol: 64.1 ml 35.32 ml/m  AORTIC VALVE AV Area (Vmax):    1.11 cm AV Area (Vmean):   1.10 cm AV Area (VTI):     1.17 cm AV Vmax:           256.25 cm/s AV Vmean:          165.500 cm/s AV VTI:            0.558 m AV Peak Grad:      26.3 mmHg AV Mean Grad:      12.5 mmHg LVOT Vmax:         125.00 cm/s LVOT Vmean:        80.200 cm/s LVOT VTI:          0.287 m LVOT/AV VTI ratio: 0.51 AI PHT:            273 msec  AORTA Ao Root diam: 2.70 cm MITRAL VALVE MV Area (PHT): 3.17  cm     SHUNTS MV Decel Time: 239 msec     Systemic VTI:  0.29 m MV E velocity: 174.00 cm/s  Systemic Diam: 1.70 cm MV A velocity: 134.00 cm/s MV E/A ratio:  1.30 Ida Rogue MD Electronically signed by Ida Rogue MD Signature Date/Time: 04/19/2020/4:49:59 PM    Final     Microbiology: Recent Results (from the past 240 hour(s))  Respiratory Panel by RT PCR (Flu A&B, Covid) - Nasopharyngeal Swab     Status: None   Collection Time: 04/18/20  8:02 AM   Specimen: Nasopharyngeal Swab  Result Value Ref Range Status   SARS Coronavirus 2 by RT PCR NEGATIVE NEGATIVE Final    Comment: (NOTE) SARS-CoV-2 target nucleic acids are NOT DETECTED.  The SARS-CoV-2 RNA is generally detectable in upper respiratoy specimens during the acute phase of infection. The lowest concentration of SARS-CoV-2 viral copies this assay can detect is 131 copies/mL. A negative result does not preclude SARS-Cov-2 infection and should not be used as the sole basis for treatment or other patient management decisions. A negative result may occur with  improper specimen collection/handling, submission of specimen other than nasopharyngeal swab, presence of viral mutation(s) within the areas targeted by this assay, and inadequate number of viral copies (<131 copies/mL). A negative result must be combined with clinical observations, patient history, and epidemiological  information. The expected result is Negative.  Fact Sheet for Patients:  PinkCheek.be  Fact Sheet for Healthcare Providers:  GravelBags.it  This test is no t yet approved or cleared by the [place] FDA and  has been authorized for detection and/or diagnosis of SARS-CoV-2 by FDA under an Emergency Use Authorization (EUA). This EUA will remain  in effect (meaning this test can be used) for the duration of the COVID-19 declaration under Section 564(b)(1) of the Act, 21 U.S.C. section 360bbb-3(b)(1), unless the authorization is terminated or revoked sooner.     Influenza A by PCR NEGATIVE NEGATIVE Final   Influenza B by PCR NEGATIVE NEGATIVE Final    Comment: (NOTE) The Xpert Xpress SARS-CoV-2/FLU/RSV assay is intended as an aid in  the diagnosis of influenza from Nasopharyngeal swab specimens and  should not be used as a sole basis for treatment. Nasal washings and  aspirates are unacceptable for Xpert Xpress SARS-CoV-2/FLU/RSV  testing.  Fact Sheet for Patients: PinkCheek.be  Fact Sheet for Healthcare Providers: GravelBags.it  This test is not yet approved or cleared by the [place] FDA and  has been authorized for detection and/or diagnosis of SARS-CoV-2 by  FDA under an Emergency Use Authorization (EUA). This EUA will remain  in effect (meaning this test can be used) for the duration of the  Covid-19 declaration under Section 564(b)(1) of the Act, 21  U.S.C. section 360bbb-3(b)(1), unless the authorization is  terminated or revoked. Performed at [facility], [address] [place], [place] [address]      Labs: Basic Metabolic Panel: Recent Labs  Lab 04/18/20 0802 04/18/20 1820 04/19/20 0426 04/20/20 0552 04/21/20 0915  NA 136  --  136 135 134*  K 5.6* 5.3* 5.3* 5.5* 4.6  CL 107  --  106 105 105  CO2 22  --  24 25 23   GLUCOSE  124*  --  102* 106* 176*  BUN 23  --  25* 24* 23  CREATININE 1.37*  --  1.65* 1.68* 1.39*  CALCIUM 9.5  --  9.4 9.5 9.2   Liver Function Tests: Recent Labs  Lab 04/18/20  0802  AST 14*  ALT 9  ALKPHOS 98  BILITOT 1.0  PROT 6.8  ALBUMIN 3.5   Recent Labs  Lab 04/18/20 0802  LIPASE 19   No results for input(s): AMMONIA in the last 168 hours. CBC: Recent Labs  Lab 04/18/20 0802 04/20/20 0552  WBC 5.9 4.5  HGB 9.1* 8.5*  HCT 27.8* 26.1*  MCV 96.5 96.7  PLT 182 174   Cardiac Enzymes: No results for input(s): CKTOTAL, CKMB, CKMBINDEX, TROPONINI in the last 168 hours. BNP: BNP (last 3 results) Recent Labs    04/18/20 0802  BNP 657.1*    ProBNP (last 3 results) No results for input(s): PROBNP in the last 8760 hours.  CBG: Recent Labs  Lab 04/20/20 1632 04/20/20 2142 04/20/20 2321 04/21/20 0836 04/21/20 1136  GLUCAP 111* 138* 146* 164* 167*       Signed:  Desma Maxim MD.  [facility] 04/21/2020, 12:42 PM

## 2020-04-21 NOTE — Progress Notes (Signed)
[place], [place] 04/21/20  Subjective:   Hospital day # 3 Patient presented to ED on 04/18/2020 for worsening shortness of breath. She is known to our practice from when she was on dialysis in 2020.  She is s/p kidney transplant in October 2020.   Patient still complaining of some shortness of breath. Earlier this a.m. she was weaned off of oxygen.  Objective:  Vital signs in last 24 hours:  Temp:  [97.8 F (36.6 C)-98.6 F (37 C)] 97.8 F (36.6 C) (11/13 0600) Pulse Rate:  [60-69] 60 (11/13 0600) Resp:  [15-18] 15 (11/13 0600) BP: (155-159)/(50-52) 157/52 (11/13 0600) SpO2:  [99 %-100 %] 100 % (11/13 0600) Weight:  [69.1 kg] 69.1 kg (11/13 0600)  Weight change: 0.153 kg Filed Weights   04/19/20 0240 04/20/20 0413 04/21/20 0600  Weight: 68.5 kg 68.9 kg 69.1 kg    Intake/Output:    Intake/Output Summary (Last 24 hours) at 04/21/2020 1312 Last data filed at 04/21/2020 1027 Gross per 24 hour  Intake 780 ml  Output 900 ml  Net -120 ml    Physical Exam: General:  Laying in bed, no acute distress  HEENT  Normocephalic, anicteric, moist oral mucous membrane  Pulm/lungs  normal breathing effort, clear to auscultation  CVS/Heart  regular rate and rhythm, no rub or gallop  Abdomen:   Soft, nontender, non-distended  Extremities:  No peripheral edema noted  Neurologic:  Alert, able to follow commands, responds appropriately   Skin:  Warm, dry, no acute rashes or lesions noted     Basic Metabolic Panel:  Recent Labs  Lab 04/18/20 0802 04/18/20 0802 04/18/20 1820 04/19/20 0426 04/20/20 0552 04/21/20 0915  NA 136  --   --  136 135 134*  K 5.6*  --  5.3* 5.3* 5.5* 4.6  CL 107  --   --  106 105 105  CO2 22  --   --  24 25 23   GLUCOSE 124*  --   --  102* 106* 176*  BUN 23  --   --  25* 24* 23  CREATININE 1.37*  --   --  1.65* 1.68* 1.39*  CALCIUM 9.5   < >  --  9.4 9.5 9.2   < > = values in this interval not displayed.      CBC: Recent Labs  Lab 04/18/20 0802 04/20/20 0552  WBC 5.9 4.5  HGB 9.1* 8.5*  HCT 27.8* 26.1*  MCV 96.5 96.7  PLT 182 174      Lab Results  Component Value Date   HEPBSAG Negative 05/22/2016   HEPBSAB NONREACTIVE 11/12/2012      Microbiology:  Recent Results (from the past 240 hour(s))  Respiratory Panel by RT PCR (Flu A&B, Covid) - Nasopharyngeal Swab     Status: None   Collection Time: 04/18/20  8:02 AM   Specimen: Nasopharyngeal Swab  Result Value Ref Range Status   SARS Coronavirus 2 by RT PCR NEGATIVE NEGATIVE Final    Comment: (NOTE) SARS-CoV-2 target nucleic acids are NOT DETECTED.  The SARS-CoV-2 RNA is generally detectable in upper respiratoy specimens during the acute phase of infection. The lowest concentration of SARS-CoV-2 viral copies this assay can detect is 131 copies/mL. A negative result does not preclude SARS-Cov-2 infection and should not be used as the sole basis for treatment or other patient management decisions. A negative result may occur with  improper specimen collection/handling, submission of specimen other than nasopharyngeal swab, presence of viral  mutation(s) within the areas targeted by this assay, and inadequate number of viral copies (<131 copies/mL). A negative result must be combined with clinical observations, patient history, and epidemiological information. The expected result is Negative.  Fact Sheet for Patients:  PinkCheek.be  Fact Sheet for Healthcare Providers:  GravelBags.it  This test is no t yet approved or cleared by the [place] FDA and  has been authorized for detection and/or diagnosis of SARS-CoV-2 by FDA under an Emergency Use Authorization (EUA). This EUA will remain  in effect (meaning this test can be used) for the duration of the COVID-19 declaration under Section 564(b)(1) of the Act, 21 U.S.C. section 360bbb-3(b)(1), unless the  authorization is terminated or revoked sooner.     Influenza A by PCR NEGATIVE NEGATIVE Final   Influenza B by PCR NEGATIVE NEGATIVE Final    Comment: (NOTE) The Xpert Xpress SARS-CoV-2/FLU/RSV assay is intended as an aid in  the diagnosis of influenza from Nasopharyngeal swab specimens and  should not be used as a sole basis for treatment. Nasal washings and  aspirates are unacceptable for Xpert Xpress SARS-CoV-2/FLU/RSV  testing.  Fact Sheet for Patients: PinkCheek.be  Fact Sheet for Healthcare Providers: GravelBags.it  This test is not yet approved or cleared by the [place] FDA and  has been authorized for detection and/or diagnosis of SARS-CoV-2 by  FDA under an Emergency Use Authorization (EUA). This EUA will remain  in effect (meaning this test can be used) for the duration of the  Covid-19 declaration under Section 564(b)(1) of the Act, 21  U.S.C. section 360bbb-3(b)(1), unless the authorization is  terminated or revoked. Performed at [facility], [address] [place], [place] [address]     Coagulation Studies: No results for input(s): LABPROT, INR in the last 72 hours.  Urinalysis: Recent Labs    04/19/20 1640  COLORURINE YELLOW*  LABSPEC 1.011  PHURINE 7.0  GLUCOSEU NEGATIVE  HGBUR NEGATIVE  BILIRUBINUR NEGATIVE  KETONESUR NEGATIVE  PROTEINUR NEGATIVE  NITRITE NEGATIVE  LEUKOCYTESUR NEGATIVE      Imaging: No results found.   Medications:   Scheduled Meds: . amLODipine  10 mg Oral Daily  . aspirin EC  81 mg Oral QHS  . atorvastatin  40 mg Oral q1800  . carvedilol  12.5 mg Oral BID WC  . cloNIDine  0.1 mg Oral BID  . enoxaparin (LOVENOX) injection  40 mg Subcutaneous Q24H  . magnesium oxide  400 mg Oral Daily  . pregabalin  75 mg Oral BID  . sodium chloride flush  3 mL Intravenous Q12H  . [START ON 04/22/2020] sodium zirconium cyclosilicate  10 g Oral Daily  .  Tacrolimus ER  8 mg Oral q morning - 10a   Continuous Infusions: . sodium chloride     PRN Meds:.sodium chloride, acetaminophen, albuterol, nitroGLYCERIN, ondansetron (ZOFRAN) IV, oxyCODONE-acetaminophen, sodium chloride flush, zolpidem     Assessment/ Plan:  64 y.o. female with   diabetes type 2 with severe complications including diabetic retinopathy, nephropathy, long standing hypertension, COPD, hyperlipidemia, congestive heart failure coronary disease with a history of non-STEMI and PCI 2014, Renal Transplant  03/2020, severe neuropathy, OSA in CPAP admitted on 04/18/2020 for CHF (congestive heart failure) ([place]) [I50.9] Pleural effusion on right [J90] Secondary hypertension [I15.9] Dyspnea, unspecified type [R06.00]  AKI , Renal Transplant Status S/p Deceased donor transplant on 15-Apr-2019 ESRD presumed secondary to DM Baseline Creatinine 1.4 (range 0.9-1.4)  Immunosuppression Tacrolimus (ENVARSUS XR) 4 mg Tb24 extended release tablet Take 2  tablets (8 mg total) by mouth every morning Last trough 6.8 on 02/01/2020 Myfortic on hold starting 09/12/19 due to leukopenia and diarrhea H/o post transplant UTI - proteus, citrobater  Lab Results  Component Value Date   CREATININE 1.39 (H) 04/21/2020   CREATININE 1.68 (H) 04/20/2020   CREATININE 1.65 (H) 04/19/2020   -Renal function has improved.  Creatinine down to 1.39 which is closer to her prior baseline creatinine.  Continue current immunosuppression.  HTN With h/o postural hypotension and syncope BP was 120's outpatient SBP inpatient running between 140's-160's -We will plan to maintain the patient on amlodipine, carvedilol, clonidine at this time.  Chronic diastolic heart failure. EF 65-70 % from 03/2019; with mod to severe MR  Hyperkalemia Potassium currently 4.6.  Maintain the patient on Lokelma 10 g daily for now.  Shortness of breath with severe hypertension Shortness of breath is likely due to elevated blood  pressure /volume overload and history of mitral regurgitation leading to pulmonary edema -Now off of diuretics.  Continue to monitor respiratory status.    LOS: 3 Win Guajardo 11/13/20211:12 PM    Rosbel Buckner Holley Raring, MD [facility]  11/13/20211:12 PM    Note: This note was prepared with Dragon dictation. Any transcription errors are unintentional

## 2020-04-21 NOTE — Progress Notes (Addendum)
Progress Note  Patient Name: Theresa [name] Date of Encounter: 04/21/2020  [facility] Cardiologist: Ida Rogue, MD   Subjective   Feels close to her baseline, breathing improved Still with gait instability, likes to walk with somebody Does not like to walk with a walker, it is too slow Reports that she does have a prescription for a walker but has not picked it up yet.  Would like 1 without the " tennis balls" -Denies any abdominal distention, no leg swelling, able to lay flat without shortness of breath  Inpatient Medications    Scheduled Meds: . amLODipine  10 mg Oral Daily  . aspirin EC  81 mg Oral QHS  . atorvastatin  40 mg Oral q1800  . carvedilol  12.5 mg Oral BID WC  . cloNIDine  0.1 mg Oral BID  . enoxaparin (LOVENOX) injection  40 mg Subcutaneous Q24H  . magnesium oxide  400 mg Oral Daily  . pregabalin  75 mg Oral BID  . sodium chloride flush  3 mL Intravenous Q12H  . [START ON 04/22/2020] sodium zirconium cyclosilicate  10 g Oral Daily  . Tacrolimus ER  8 mg Oral q morning - 10a   Continuous Infusions: . sodium chloride     PRN Meds: sodium chloride, acetaminophen, albuterol, nitroGLYCERIN, ondansetron (ZOFRAN) IV, oxyCODONE-acetaminophen, sodium chloride flush, zolpidem   Vital Signs    Vitals:   04/20/20 1138 04/20/20 1635 04/20/20 2040 04/21/20 0600  BP: (!) 167/47 (!) 159/52 (!) 155/50 (!) 157/52  Pulse: 64 66 69 60  Resp: 19 18 18 15   Temp: 97.6 F (36.4 C) 97.8 F (36.6 C) 98.6 F (37 C) 97.8 F (36.6 C)  TempSrc:   Oral Oral  SpO2: 100% 99% 100% 100%  Weight:    69.1 kg  Height:        Intake/Output Summary (Last 24 hours) at 04/21/2020 1247 Last data filed at 04/21/2020 1027 Gross per 24 hour  Intake 780 ml  Output 900 ml  Net -120 ml   Last 3 Weights 04/21/2020 04/20/2020 04/19/2020  Weight (lbs) 152 lb 5.4 oz 152 lb 151 lb 1.6 oz  Weight (kg) 69.1 kg 68.947 kg 68.539 kg      Telemetry    Normal sinus rhythm- Personally  Reviewed  ECG     - Personally Reviewed  Physical Exam   GEN: No acute distress.   Neck: No JVD Cardiac: RRR, no murmurs, rubs, or gallops.  Respiratory: Clear to auscultation bilaterally. GI: Soft, nontender, non-distended  MS: No edema; No deformity. Neuro:  Nonfocal  Psych: Normal affect   Labs    High Sensitivity Troponin:   Recent Labs  Lab 04/18/20 0802 04/18/20 1358 04/18/20 1820  TROPONINIHS 11 11 12       Chemistry Recent Labs  Lab 04/18/20 0802 04/18/20 1820 04/19/20 0426 04/20/20 0552 04/21/20 0915  NA 136  --  136 135 134*  K 5.6*   < > 5.3* 5.5* 4.6  CL 107  --  106 105 105  CO2 22  --  24 25 23   GLUCOSE 124*  --  102* 106* 176*  BUN 23  --  25* 24* 23  CREATININE 1.37*  --  1.65* 1.68* 1.39*  CALCIUM 9.5  --  9.4 9.5 9.2  PROT 6.8  --   --   --   --   ALBUMIN 3.5  --   --   --   --   AST 14*  --   --   --   --  ALT 9  --   --   --   --   ALKPHOS 98  --   --   --   --   BILITOT 1.0  --   --   --   --   GFRNONAA 44*  --  35* 34* 43*  ANIONGAP 7  --  6 5 6    < > = values in this interval not displayed.     Hematology Recent Labs  Lab 04/18/20 0802 04/20/20 0552  WBC 5.9 4.5  RBC 2.88* 2.70*  HGB 9.1* 8.5*  HCT 27.8* 26.1*  MCV 96.5 96.7  MCH 31.6 31.5  MCHC 32.7 32.6  RDW 15.0 15.1  PLT 182 174    BNP Recent Labs  Lab 04/18/20 0802  BNP 657.1*     DDimer No results for input(s): DDIMER in the last 168 hours.   Radiology    No results found.  Cardiac Studies     Patient Profile     64 y.o. female with history of CAD s/p RCA stenting, moderate MR, HFpEF, ESRD presumed secondary to diabetic neuropathy previously on HD s/p deceased donor renal transplant on 03/30/2019, COPD, PVD, carotid artery disease, DM2 with diabetic neuropathy and retinopathy, sickle cell trait, hypertension, hyperlipidemia, lumbar radiculopathy, thyroid nodule, esophageal stenosis with history of esophageal ulcers s/p dilation, and remote tobacco  use who is being seen today for ongoing management of chronic HFrEF.  Assessment & Plan    Acute on chronic diastolic CHF In the setting of hypertensive urgency, flash pulmonary edema, mitral valve regurgitation -As an outpatient 2 of her blood pressure medications have been held, blood pressure markedly elevated through much of her hospital course particularly on arrival to the hospital -Would recommend outpatient oral Lasix as needed for 3 pound weight gain or shortness of breath symptoms given bump in creatinine -She has been restarted on her clonidine, amlodipine, carvedilol continued Blood pressure still 053 systolic We will follow as an outpatient for further medication titration  Essential hypertension -Back on her prior outpatient regimen amlodipine carvedilol clonidine Recommend she closely monitor blood pressure at home and call us with some numbers  Mitral valve regurgitation -Likely dynamic in the setting of CHF -Was estimated to be moderate on echocardiogram this admission -No surgery needed at this time  End-stage renal disease, renal transplant Followed at [facility]  Anemia Anemia of chronic disease, Will defer to nephrology whether she needs iron infusion  Long time spent discussing discharge instructions, Management of blood pressure, need for more walking and activity given her general weakness , need to pick up a walker, stay active through the winter, suggested she call us with blood pressure measurements Lasix as needed for any shortness of breath spells Could also take nitroglycerin for any spikes in blood pressure  Total encounter time more than 35 minutes  Greater than 50% was spent in counseling and coordination of care with the patient  For questions or updates, please contact [facility] Please consult [email or web address] for contact info under        Signed, Ida Rogue, MD  04/21/2020, 12:47 PM

## 2020-04-23 ENCOUNTER — Telehealth: Payer: Self-pay | Admitting: Cardiovascular Disease

## 2020-04-23 DIAGNOSIS — Z940 Kidney transplant status: Principal | ICD-10-CM

## 2020-04-23 DIAGNOSIS — Z79899 Other long term (current) drug therapy: Principal | ICD-10-CM

## 2020-04-23 MED ORDER — PREGABALIN 75 MG CAPSULE
ORAL_CAPSULE | ORAL | 3 refills | 30 days | Status: CP
Start: 2020-04-23 — End: ?
  Filled 2020-05-01: qty 90, 30d supply, fill #0

## 2020-04-23 NOTE — Telephone Encounter (Signed)
-----   Message from Minna Merritts, MD sent at 04/21/2020 12:53 PM EST ----- Needs close follow-up in clinic after recent discharge from the hospital Hypertension, CHF APP okay TG

## 2020-04-23 NOTE — Telephone Encounter (Signed)
Attempted to schedule.  LMOV to call office.  ° °

## 2020-04-23 NOTE — Unmapped (Signed)
Patient discharged from the hospital Saturday diagnosed with CHF    23rd sees heart failure clinic (cardiology)    24th nurse visit/ PMD    Per her discharge paperwork she is to start taking amlodine, clonidine, and lokelma    Patient unsure of her frequency of meds but will see when she picks up her medication.    Will get her labs checked again this week (cbc, mag, phos, bmp, tac), sent new [facility] orders to [facility] and gave her list of labs that are needed    Verbalized understanding

## 2020-04-23 NOTE — Unmapped (Signed)
[facility] Refill Coordination Note    Specialty Medication(s) to be Shipped:   Transplant: Envarsus XR 4mg   Other medication(s) to be shipped: Vitamin D & Pregabalin 75mg      [name], DOB: 05-06-1956  Phone: [phone] (home)     All above HIPAA information was verified with patient.     Was a Nurse, learning disability used for this call? No    Completed refill call assessment today to schedule patient's medication shipment from the [facility] [phone]).       Specialty medication(s) and dose(s) confirmed: Regimen is correct and unchanged.   Changes to medications: Joany reports no changes at this time.  Changes to insurance: No  Questions for the pharmacist: No    Confirmed patient received Welcome Packet with first shipment. The patient will receive a drug information handout for each medication shipped and additional FDA Medication Guides as required.       DISEASE/MEDICATION-SPECIFIC INFORMATION        N/A    SPECIALTY MEDICATION ADHERENCE     Medication Adherence    Patient reported X missed doses in the last month: 0  Specialty Medication: Envarsus XR 4mg   Patient is on additional specialty medications: No  Informant: patient  Reliability of informant: reliable        Envarsus XR 4 mg: 2 days of medicine on hand     SHIPPING     Shipping address confirmed in Epic.     Delivery Scheduled: Yes, Expected medication delivery date: 04/25/2020.     Medication will be delivered via UPS to the prescription address in Epic WAM.    Kamisha Ell P Wetzel [place] [facility] Specialty Technician

## 2020-04-24 ENCOUNTER — Telehealth: Payer: Self-pay

## 2020-04-24 LAB — TACROLIMUS LEVEL: Tacrolimus (FK506) - LabCorp: 2.9 ng/mL (ref 2.0–20.0)

## 2020-04-24 MED FILL — ENVARSUS XR 4 MG TABLET,EXTENDED RELEASE: 7 days supply | Qty: 14 | Fill #4 | Status: AC

## 2020-04-24 MED FILL — ERGOCALCIFEROL (VITAMIN D2) 1,250 MCG (50,000 UNIT) CAPSULE: ORAL | 28 days supply | Qty: 4 | Fill #2

## 2020-04-24 MED FILL — ERGOCALCIFEROL (VITAMIN D2) 1,250 MCG (50,000 UNIT) CAPSULE: 28 days supply | Qty: 4 | Fill #2 | Status: AC

## 2020-04-24 MED FILL — ENVARSUS XR 4 MG TABLET,EXTENDED RELEASE: ORAL | 7 days supply | Qty: 14 | Fill #4

## 2020-04-24 NOTE — Telephone Encounter (Signed)
Transition Care Management Unsuccessful Follow-up Telephone Call  Date of discharge and from where:  04/21/20 from [facility]  Attempts:  1st Attempt  Reason for unsuccessful TCM follow-up call:  Left voice message.

## 2020-04-25 NOTE — Telephone Encounter (Signed)
Patient confirms scheduling with Nephrology and Cardiology. Declines hospital follow up with PMD. Agrees to follow up as needed.

## 2020-04-26 ENCOUNTER — Other Ambulatory Visit: Payer: Self-pay

## 2020-04-26 ENCOUNTER — Ambulatory Visit (INDEPENDENT_AMBULATORY_CARE_PROVIDER_SITE_OTHER): Payer: Medicare HMO | Admitting: Podiatry

## 2020-04-26 ENCOUNTER — Encounter: Payer: Self-pay | Admitting: Podiatry

## 2020-04-26 DIAGNOSIS — L608 Other nail disorders: Secondary | ICD-10-CM

## 2020-04-26 DIAGNOSIS — M79675 Pain in left toe(s): Secondary | ICD-10-CM

## 2020-04-26 DIAGNOSIS — I739 Peripheral vascular disease, unspecified: Secondary | ICD-10-CM

## 2020-04-26 DIAGNOSIS — E1142 Type 2 diabetes mellitus with diabetic polyneuropathy: Secondary | ICD-10-CM | POA: Diagnosis not present

## 2020-04-26 DIAGNOSIS — B351 Tinea unguium: Secondary | ICD-10-CM | POA: Diagnosis not present

## 2020-04-26 DIAGNOSIS — M79674 Pain in right toe(s): Secondary | ICD-10-CM

## 2020-04-26 NOTE — Progress Notes (Signed)
This patient returns to my office for at risk foot care.  This patient requires this care by a professional since this patient will be at risk due to having kidney transplant, PAD, and diabetes.  This patient is unable to cut nails herself since the patient cannot reach her nails.These nails are painful walking and wearing shoes.  This patient presents for at risk foot care today.  General Appearance  Alert, conversant and in no acute stress.  Vascular  Dorsalis pedis and posterior tibial  pulses are palpable  bilaterally.  Capillary return is within normal limits  bilaterally. Temperature is within normal limits  bilaterally.  Neurologic  Senn-Weinstein monofilament wire test within normal limits  bilaterally. Muscle power within normal limits bilaterally.  Nails Thick disfigured discolored nails with subungual debris  from hallux to fifth toes bilaterally. No evidence of bacterial infection or drainage bilaterally.  Orthopedic  No limitations of motion  feet .  No crepitus or effusions noted.  No bony pathology or digital deformities noted.  HAV  B/L.  Skin  normotropic skin with no porokeratosis noted bilaterally.  No signs of infections or ulcers noted.     Onychomycosis  Pain in right toes  Pain in left toes  Consent was obtained for treatment procedures.   Mechanical debridement of nails 1-5  bilaterally performed with a nail nipper.  Filed with dremel without incident.    Return office visit   3 months.                  Told patient to return for periodic foot care and evaluation due to potential at risk complications.   Gardiner Barefoot DPM

## 2020-04-26 NOTE — Progress Notes (Signed)
Cardiology Office Note  Date:  04/27/2020   ID:  Theresa [name], DOB Jan 12, 1956, MRN [id number]  PCP:  McLean-Scocuzza, Nino Glow, MD   Chief Complaint  Patient presents with   office visit    Hospital F/U-SOB. Patient states she is now feeling much better and is no longer SOB; Meds verbally reviewed with patient.    HPI:  Theresa [name] is a pleasant 64 -year old woman with  CAD,  Drug-eluting stent placed December 2014 to the mid RCA Repeat cath 2017 for positive stress test at [facility], medical management 25 years of smoking (quit in 2013-2014),  diabetes,  chronic renal insufficiency,   moderate to severe mitral valve regurgitation on prior echocardiogram (improved to moderate MR on recent echo), pneumonia in June of 2012 with admission overnight to [facility] ESRD on dialysis since February 2014, 3 days per week. 59% bilateral carotid arterial disease as of 2017  Positive stress test  04/20/16 at [facility] Renal transplant She presents today for follow-up of her coronary artery disease   Last seen in clinic by myself September 2018 Most recently seen by one of our providers May 2021  Review of recent records details Myoview January 2019 at [facility] no ischemia Echocardiogram February 2019 [facility] moderate MR, normal ejection fraction Tolerating hemodialysis Monday Wednesday Friday Echocardiogram July 2020 normal ejection fraction [facility] 04/21/19 deceased donor renal transplant  Recently in the hospital November 2021 Records reviewed in detail with her Hypertensive urgency, flash pulmonary edema, mitral valve regurgitation She had stopped several of her blood pressure medications, these were restarted with improved blood pressure Now on amlodipine carvedilol clonidine Sublingual nitro for any spikes in pressure  Feels well on today's visit No chest pain or SOB Denies leg swelling Previous office notes reported high fluid intake on weekends   Other past medical history reviewed Cardic  catheterization 2017 showed 70% disease ostial right PDA, unchanged from 2014 cardiac catheterization otherwise other regions of 20-30% disease, nonobstructive  PMH:   has a past medical history of Anemia of chronic disease, Anginal pain ([place]), Carotid arterial disease ([place]), Chronic constipation, Chronic diastolic CHF (congestive heart failure) ([place]), Colon polyps, COPD (chronic obstructive pulmonary disease) ([place]), Coronary artery disease, Diabetes mellitus, Diabetic neuropathy ([place]), Diabetic retinopathy ([place]) (05/28/2013), Emphysema, ESRD on hemodialysis ([place]), History of bronchitis, History of pneumonia, History of tobacco abuse, Hyperlipidemia, Hypertension, Moderate to severe mitral insufficiency, Myocardial infarct ([place]) (05/2013), Peripheral vascular disease ([place]), Sickle cell trait ([place]), and Thyroid nodule.  PSH:    Past Surgical History:  Procedure Laterality Date   A/V FISTULAGRAM Left 11/10/2017   Procedure: A/V FISTULAGRAM;  Surgeon: Katha Cabal, MD;  Location: [place] [facility];  Service: Cardiovascular;  Laterality: Left;   A/V SHUNTOGRAM Left 02/08/2019   Procedure: A/V SHUNTOGRAM;  Surgeon: Katha Cabal, MD;  Location: [place] [facility];  Service: Cardiovascular;  Laterality: Left;   ABDOMINAL HYSTERECTOMY     2000   CARDIAC CATHETERIZATION     CARDIAC CATHETERIZATION N/A 05/02/2015   Procedure: Left Heart Cath and Coronary Angiography;  Surgeon: [name], MD;  Location: [place] [facility];  Service: Cardiovascular;  Laterality: N/A;   COLONOSCOPY WITH PROPOFOL N/A 07/08/2016   Procedure: COLONOSCOPY WITH PROPOFOL;  Surgeon: [name], MD;  Location: [facility] ENDOSCOPY;  Service: Endoscopy;  Laterality: N/A;   COLONOSCOPY WITH PROPOFOL N/A 08/20/2017   Procedure: COLONOSCOPY WITH PROPOFOL;  Surgeon: [name], MD;  Location: [facility] ENDOSCOPY;  Service: Gastroenterology;  Laterality: N/A;  colonscopy     CORONARY ANGIOPLASTY  05/28/2014   stent  placement to the mid RCA   CYSTOURETHROSCOPY     [facility] urology with stent placement 11 or 05/2019 and stent removal 06/13/19    DILATION AND CURETTAGE OF UTERUS     several in the early 80's   ESOPHAGOGASTRODUODENOSCOPY     2012   ESOPHAGOGASTRODUODENOSCOPY (EGD) WITH PROPOFOL N/A 03/11/2018   Procedure: ESOPHAGOGASTRODUODENOSCOPY (EGD) WITH PROPOFOL;  Surgeon: [name], MD;  Location: [facility] ENDOSCOPY;  Service: Gastroenterology;  Laterality: N/A;   EYE SURGERY     bilateral laser 2012   EYE SURGERY     right   EYE SURGERY     x4 both eyes   GAS INSERTION  09/30/2011   Procedure: INSERTION OF GAS;  Surgeon: [name], MD;  Location: [place];  Service: Ophthalmology;  Laterality: Right;  C3F8   GAS/FLUID EXCHANGE  09/30/2011   Procedure: GAS/FLUID EXCHANGE;  Surgeon: [name], MD;  Location: [place];  Service: Ophthalmology;  Laterality: Right;   KIDNEY TRANSPLANT     03/30/19 Dr. Mickel Baas Thomas/Dr. Cristie Hem Zendel/Dr. Gilford Raid; [facility] transplant Dr. Horald Chestnut will be primary [phone] coordinator Sarah Wynkoop [phone]   KIDNEY TRANSPLANT     03/30/19 [facility]   LEFT HEART CATHETERIZATION WITH CORONARY ANGIOGRAM N/A 05/28/2013   Procedure: LEFT HEART CATHETERIZATION WITH CORONARY ANGIOGRAM;  Surgeon: [name], MD;  Location: [facility] CATH LAB;  Service: Cardiovascular;  Laterality: N/A;   PARS PLANA VITRECTOMY  04/22/2011   Procedure: PARS PLANA VITRECTOMY WITH 25 GAUGE;  Surgeon: [name], MD;  Location: [place];  Service: Ophthalmology;  Laterality: Left;  membrane peel, endolaser, gas fluid exchange, silicone oil, repair of complex traction retinal detachment   PARS PLANA VITRECTOMY  09/30/2011   Procedure: PARS PLANA VITRECTOMY WITH 25 GAUGE;  Surgeon: [name], MD;  Location: [place];  Service: Ophthalmology;  Laterality: Right;  Endolaser; Repair of Complex Traction Retinal Detachment   PARS PLANA VITRECTOMY  02/24/2012   Procedure: PARS PLANA  VITRECTOMY WITH 25 GAUGE;  Surgeon: [name], MD;  Location: [place];  Service: Ophthalmology;  Laterality: Left;   PTCA     SILICON OIL REMOVAL  07/11/5425   Procedure: SILICON OIL REMOVAL;  Surgeon: [name], MD;  Location: [place];  Service: Ophthalmology;  Laterality: Left;   THROMBECTOMY / ARTERIOVENOUS GRAFT REVISION     TUBAL LIGATION     1979   UPPER EXTREMITY ANGIOGRAPHY Left 02/08/2019   Procedure: UPPER EXTREMITY ANGIOGRAPHY;  Surgeon: Katha Cabal, MD;  Location: [place] [facility];  Service: Cardiovascular;  Laterality: Left;    Current Outpatient Medications  Medication Sig Dispense Refill   acetaminophen (TYLENOL) 500 MG tablet Take 1,000 mg by mouth 2 (two) times daily.     albuterol (PROVENTIL) (2.5 MG/3ML) 0.083% nebulizer solution Take 3 mLs (2.5 mg total) by nebulization every 6 (six) hours as needed for wheezing or shortness of breath. 75 mL 11   albuterol (VENTOLIN HFA) 108 (90 Base) MCG/ACT inhaler Inhale 1-2 puffs into the lungs every 6 (six) hours as needed for wheezing or shortness of breath. 1 Inhaler 12   amLODipine (NORVASC) 10 MG tablet Take 1 tablet (10 mg total) by mouth daily. 30 tablet 1   aspirin EC 81 MG tablet Take 1 tablet (81 mg total) by mouth at bedtime. 30 tablet 11   atorvastatin (LIPITOR) 20 MG tablet  Take 20 mg by mouth daily.     carvedilol (COREG) 12.5 MG tablet Take 1 tablet (12.5 mg total) by mouth 2 (two) times daily with a meal. 180 tablet 1   cetirizine (ZYRTEC) 5 MG tablet Take 1 tablet (5 mg total) by mouth daily as needed for allergies. 90 tablet 3   cloNIDine (CATAPRES) 0.1 MG tablet Take 1 tablet (0.1 mg total) by mouth 2 (two) times daily. 60 tablet 1   ergocalciferol (VITAMIN D2) 1.25 MG (50000 UT) capsule Take 50,000 Units by mouth once a week.     esomeprazole (NEXIUM) 20 MG capsule Take 20 mg by mouth daily.     magnesium oxide (MAG-OX) 400 MG tablet Take 400 mg by mouth 2 (two) times daily.       nitroGLYCERIN (NITROSTAT) 0.4 MG SL tablet Place 1 tablet (0.4 mg total) under the tongue every 5 (five) minutes as needed for chest pain. 25 tablet 3   pregabalin (LYRICA) 75 MG capsule Take 1 capsule (75 mg total) by mouth 2 (two) times daily.     pregabalin (LYRICA) 75 MG capsule Take 1 capsule (75mg ) in the AM and 2 capsules (150mg ) in the PM     sodium bicarbonate 650 MG tablet Take 650 mg by mouth 2 (two) times daily.     sodium zirconium cyclosilicate (LOKELMA) 10 g PACK packet Take 10 g by mouth daily. 30 packet 1   Tacrolimus ER 4 MG TB24 Take 8 mg by mouth every morning.      UNABLE TO FIND TEST BLOOD SUGARS 2 TIMES DAILY     No current facility-administered medications for this visit.     Allergies:   Hydrocodone and Lisinopril   Social History:  The patient  reports that she quit smoking about 9 years ago. She has a 25.00 pack-year smoking history. She has never used smokeless tobacco. She reports that she does not drink alcohol and does not use drugs.   Family History:   family history includes Breast cancer in her sister; Colon cancer in her father and sister; Diabetes in her brother, brother, and daughter; Heart attack in her brother and mother; Heart disease in her mother; Renal Disease in her daughter; Stroke in her brother and mother.    Review of Systems: Review of Systems  Constitutional: Negative.   HENT: Negative.   Respiratory: Negative.   Cardiovascular: Negative.   Gastrointestinal: Negative.   Musculoskeletal: Negative.   Neurological: Negative.   Psychiatric/Behavioral: Negative.   All other systems reviewed and are negative.   PHYSICAL EXAM: VS:  BP 140/70 (BP Location: Right Arm, Patient Position: Sitting, Cuff Size: Normal)    Pulse 64    Ht 5' 7.5" (1.715 m)    Wt 150 lb (68 kg)    SpO2 98%    BMI 23.15 kg/m  , BMI Body mass index is 23.15 kg/m. Repeat blood pressure 170/70 GEN: Well nourished, well developed, in no acute distress  HEENT:  normal  Neck: no JVD, carotid bruits, or masses Cardiac: RRR;2+ SEM LSB,rubs, or gallops,no edema  Respiratory:  clear to auscultation bilaterally, normal work of breathing GI: soft, nontender, nondistended, + BS MS: no deformity or atrophy  Skin: warm and dry, no rash Neuro:  Strength and sensation are intact Psych: euthymic mood, full affect  Recent Labs: 04/18/2020: ALT 9; B Natriuretic Peptide 657.1 04/20/2020: Hemoglobin 8.5; Platelets 174 04/21/2020: BUN 23; Creatinine, Ser 1.39; Potassium 4.6; Sodium 134    Lipid Panel Lab Results  Component Value Date   CHOL 161 05/22/2016   HDL 59 05/22/2016   LDLCALC 89 05/22/2016   TRIG 65 05/22/2016      Wt Readings from Last 3 Encounters:  04/27/20 150 lb (68 kg)  04/21/20 152 lb 5.4 oz (69.1 kg)  02/24/20 149 lb 6.4 oz (67.8 kg)     ASSESSMENT AND PLAN:  Essential (primary) hypertension -  Recommend she stay on her current regiment For spikes in pressure she may need sublingual nitro  Coronary artery disease involving native coronary artery of native heart without angina pectoris - Plan: EKG 12-Lead Currently with no symptoms of angina. No further workup at this time. Continue current medication regimen.  Mitral valve insufficiency, unspecified etiology - Previous echocardiogram September 2017 with moderate to severe MR Likely dynamic MR, will change with fluid status from hemodialysis Moderate on recent echo  ESRD, on hemodialysis ([place]) Off HD Had transplant Stable  Diabetic polyneuropathy associated with type 2 diabetes mellitus (HCC) HBA1C 5.0 Off medications  S/P coronary artery stent placement Denies anginal symptoms  Carotid stenosis 40-59% bilateral disease Quit smoking, 2014 Periodic carotid u/s   Total encounter time more than 25 minutes  Greater than 50% was spent in counseling and coordination of care with the patient   No orders of the defined types were placed in this  encounter.    Signed, Esmond Plants, M.D., Ph.D. 04/27/2020  [place], [place]

## 2020-04-27 ENCOUNTER — Encounter: Payer: Self-pay | Admitting: Cardiovascular Disease

## 2020-04-27 ENCOUNTER — Ambulatory Visit (INDEPENDENT_AMBULATORY_CARE_PROVIDER_SITE_OTHER): Payer: Medicare HMO | Admitting: Cardiovascular Disease

## 2020-04-27 VITALS — BP 140/70 | HR 64 | Ht 67.5 in | Wt 150.0 lb

## 2020-04-27 DIAGNOSIS — I5032 Chronic diastolic (congestive) heart failure: Secondary | ICD-10-CM | POA: Diagnosis not present

## 2020-04-27 DIAGNOSIS — I25118 Atherosclerotic heart disease of native coronary artery with other forms of angina pectoris: Secondary | ICD-10-CM | POA: Diagnosis not present

## 2020-04-27 DIAGNOSIS — I351 Nonrheumatic aortic (valve) insufficiency: Secondary | ICD-10-CM

## 2020-04-27 DIAGNOSIS — I10 Essential (primary) hypertension: Secondary | ICD-10-CM

## 2020-04-27 DIAGNOSIS — I739 Peripheral vascular disease, unspecified: Secondary | ICD-10-CM

## 2020-04-27 DIAGNOSIS — I340 Nonrheumatic mitral (valve) insufficiency: Secondary | ICD-10-CM

## 2020-04-27 DIAGNOSIS — N186 End stage renal disease: Secondary | ICD-10-CM | POA: Diagnosis not present

## 2020-04-27 MED ORDER — NITROGLYCERIN 0.4 MG SL SUBL: 0.4000 mg | SUBLINGUAL_TABLET | SUBLINGUAL | 3 refills | Status: DC | PRN

## 2020-04-27 MED ORDER — FUROSEMIDE 20 MG PO TABS: 20.0000 mg | ORAL_TABLET | Freq: Every day | ORAL | 0 refills | Status: DC | PRN

## 2020-04-27 NOTE — Patient Instructions (Addendum)
  Medication Instructions:  For elevated blood pressure >160 and shortness of breath, Ok to take NTG and lasix/furosemide  1) START lasix (furosemide) 20 mg- take 1 tablet by mouth once daily as needed for shortness of breath  If you need a refill on your cardiac medications before your next appointment, please call your pharmacy.    Lab work: No new labs needed   If you have labs (blood work) drawn today and your tests are completely normal, you will receive your results only by: Marland Kitchen MyChart Message (if you have MyChart) OR . A paper copy in the mail If you have any lab test that is abnormal or we need to change your treatment, we will call you to review the results.   Testing/Procedures: No new testing needed   Follow-Up: At [facility], you and your health needs are our priority.  As part of our continuing mission to provide you with exceptional heart care, we have created designated Provider Care Teams.  These Care Teams include your primary Cardiologist (physician) and Advanced Practice Providers (APPs -  Physician Assistants and Nurse Practitioners) who all work together to provide you with the care you need, when you need it.  . You will need a follow up appointment in 6 months, APP ok  . Providers on your designated Care Team:   . Murray Hodgkins, NP . Christell Faith, PA-C . Marrianne Mood, PA-C  Any Other Special Instructions Will Be Listed Below (If Applicable).  COVID-19 Vaccine Information can be found at: ShippingScam.co.uk For questions related to vaccine distribution or appointments, please email vaccine@[email or web address] or call [phone].

## 2020-04-30 DIAGNOSIS — Z940 Kidney transplant status: Principal | ICD-10-CM

## 2020-04-30 DIAGNOSIS — Z79899 Other long term (current) drug therapy: Principal | ICD-10-CM

## 2020-04-30 NOTE — Unmapped (Signed)
[facility] Refill Coordination Note    Specialty Medication(s) to be Shipped:   Transplant: Envarsus 4mg     Other medication(s) to be shipped: Lyrica 75mg      Michele Cisneros, DOB: 1956/06/07  Phone: [phone] (home)       All above HIPAA information was verified with patient.     Was a Nurse, learning disability used for this call? No    Completed refill call assessment today to schedule patient's medication shipment from the [facility] [phone]).       Specialty medication(s) and dose(s) confirmed: Regimen is correct and unchanged.   Changes to medications: Michele Cisneros reports no changes at this time.  Changes to insurance: No  Questions for the pharmacist: No    Confirmed patient received Welcome Packet with first shipment. The patient will receive a drug information handout for each medication shipped and additional FDA Medication Guides as required.       DISEASE/MEDICATION-SPECIFIC INFORMATION        N/A    SPECIALTY MEDICATION ADHERENCE     Medication Adherence    Patient reported X missed doses in the last month: 0        Envarsus 4mg : 10 days worth of medication on hand.            SHIPPING     Shipping address confirmed in Epic.     Delivery Scheduled: Yes, Expected medication delivery date: 05/02/20.     Medication will be delivered via UPS to the prescription address in Epic WAM.    Michele Cisneros   [facility] Specialty Technician

## 2020-05-01 ENCOUNTER — Ambulatory Visit: Payer: Medicare HMO | Admitting: Family

## 2020-05-01 DIAGNOSIS — Z940 Kidney transplant status: Principal | ICD-10-CM

## 2020-05-01 LAB — CBC W/ DIFFERENTIAL
BANDED NEUTROPHILS ABSOLUTE COUNT: 0 10*3/uL (ref 0.0–0.1)
BASOPHILS ABSOLUTE COUNT: 0.1 10*3/uL (ref 0.0–0.2)
BASOPHILS RELATIVE PERCENT: 1 %
EOSINOPHILS ABSOLUTE COUNT: 0.3 10*3/uL (ref 0.0–0.4)
EOSINOPHILS RELATIVE PERCENT: 6 %
HEMATOCRIT: 28.9 % — ABNORMAL LOW (ref 34.0–46.6)
HEMOGLOBIN: 9.6 g/dL — ABNORMAL LOW (ref 11.1–15.9)
IMMATURE GRANULOCYTES: 0 %
LYMPHOCYTES ABSOLUTE COUNT: 0.7 10*3/uL (ref 0.7–3.1)
LYMPHOCYTES RELATIVE PERCENT: 12 %
MEAN CORPUSCULAR HEMOGLOBIN CONC: 33.2 g/dL (ref 31.5–35.7)
MEAN CORPUSCULAR HEMOGLOBIN: 31.3 pg (ref 26.6–33.0)
MEAN CORPUSCULAR VOLUME: 94 fL (ref 79–97)
MONOCYTES ABSOLUTE COUNT: 0.3 10*3/uL (ref 0.1–0.9)
MONOCYTES RELATIVE PERCENT: 6 %
NEUTROPHILS ABSOLUTE COUNT: 4.3 10*3/uL (ref 1.4–7.0)
NEUTROPHILS RELATIVE PERCENT: 75 %
PLATELET COUNT: 195 10*3/uL (ref 150–450)
RED BLOOD CELL COUNT: 3.07 x10E6/uL — ABNORMAL LOW (ref 3.77–5.28)
RED CELL DISTRIBUTION WIDTH: 14.9 % (ref 11.7–15.4)
WHITE BLOOD CELL COUNT: 5.7 10*3/uL (ref 3.4–10.8)

## 2020-05-01 LAB — BASIC METABOLIC PANEL
BLOOD UREA NITROGEN: 16 mg/dL (ref 8–27)
BUN / CREAT RATIO: 13 (ref 12–28)
CALCIUM: 9.6 mg/dL (ref 8.7–10.3)
CHLORIDE: 104 mmol/L (ref 96–106)
CO2: 19 mmol/L — ABNORMAL LOW (ref 20–29)
CREATININE: 1.22 mg/dL — ABNORMAL HIGH (ref 0.57–1.00)
GFR MDRD AF AMER: 55 mL/min/{1.73_m2} — ABNORMAL LOW
GFR MDRD NON AF AMER: 48 mL/min/{1.73_m2} — ABNORMAL LOW
GLUCOSE: 120 mg/dL — ABNORMAL HIGH (ref 65–99)
POTASSIUM: 5.1 mmol/L (ref 3.5–5.2)
SODIUM: 137 mmol/L (ref 134–144)

## 2020-05-01 LAB — URINALYSIS
BILIRUBIN UA: NEGATIVE
BLOOD UA: NEGATIVE
GLUCOSE UA: NEGATIVE
KETONES UA: NEGATIVE
LEUKOCYTE ESTERASE UA: NEGATIVE
NITRITE UA: NEGATIVE
PH UA: 6 (ref 5.0–7.5)
PROTEIN UA: NEGATIVE
SPECIFIC GRAVITY UA: 1.012 (ref 1.005–1.030)
UROBILINOGEN UA: 0.2 mg/dL (ref 0.2–1.0)

## 2020-05-01 LAB — MICROSCOPIC EXAMINATION
BACTERIA: NONE SEEN
CASTS: NONE SEEN /LPF
RBC URINE: NONE SEEN /HPF (ref 0–2)
WBC URINE: NONE SEEN /HPF (ref 0–5)

## 2020-05-01 LAB — PHOSPHORUS: PHOSPHORUS, SERUM: 3.3 mg/dL (ref 3.0–4.3)

## 2020-05-01 LAB — MAGNESIUM: MAGNESIUM: 2.1 mg/dL (ref 1.6–2.3)

## 2020-05-01 MED FILL — ENVARSUS XR 4 MG TABLET,EXTENDED RELEASE: 7 days supply | Qty: 14 | Fill #5 | Status: AC

## 2020-05-01 MED FILL — PREGABALIN 75 MG CAPSULE: 30 days supply | Qty: 90 | Fill #0 | Status: AC

## 2020-05-01 MED FILL — ENVARSUS XR 4 MG TABLET,EXTENDED RELEASE: ORAL | 7 days supply | Qty: 14 | Fill #5

## 2020-05-02 ENCOUNTER — Ambulatory Visit: Payer: Medicare Other

## 2020-05-02 LAB — TACROLIMUS LEVEL: TACROLIMUS BLOOD: 5.5 ng/mL (ref 2.0–20.0)

## 2020-05-07 DIAGNOSIS — Z940 Kidney transplant status: Principal | ICD-10-CM

## 2020-05-07 DIAGNOSIS — Z79899 Other long term (current) drug therapy: Principal | ICD-10-CM

## 2020-05-08 ENCOUNTER — Ambulatory Visit: Payer: Medicare HMO | Admitting: Cardiovascular Disease

## 2020-05-08 NOTE — Unmapped (Signed)
[facility] Refill Coordination Note    Specialty Medication(s) to be Shipped:   Transplant: Envarsus 4mg     Other medication(s) to be shipped: No additional medications requested for fill at this time     Michele Cisneros, DOB: 26-Dec-1955  Phone: [phone] (home)       All above HIPAA information was verified with patient.     Was a Nurse, learning disability used for this call? No    Completed refill call assessment today to schedule patient's medication shipment from the [facility] [phone]).       Specialty medication(s) and dose(s) confirmed: Regimen is correct and unchanged.   Changes to medications: [name] reports no changes at this time.  Changes to insurance: No  Questions for the pharmacist: No    Confirmed patient received Welcome Packet with first shipment. The patient will receive a drug information handout for each medication shipped and additional FDA Medication Guides as required.       DISEASE/MEDICATION-SPECIFIC INFORMATION        N/A    SPECIALTY MEDICATION ADHERENCE     Medication Adherence    Patient reported X missed doses in the last month: 0  Specialty Medication: Envarsus  Patient is on additional specialty medications: No  Patient is on more than two specialty medications: No  Any gaps in refill history greater than 2 weeks in the last 3 months: no  Demonstrates understanding of importance of adherence: yes  Informant: patient                Envarsus 4mg : Patient has 3 days of medication on hand      SHIPPING     Shipping address confirmed in Epic.     Delivery Scheduled: Yes, Expected medication delivery date: 12/2.     Medication will be delivered via UPS to the prescription address in Epic WAM.    Olga Millers   [facility] Specialty Technician

## 2020-05-09 MED FILL — ENVARSUS XR 4 MG TABLET,EXTENDED RELEASE: ORAL | 7 days supply | Qty: 14 | Fill #6

## 2020-05-09 MED FILL — ENVARSUS XR 4 MG TABLET,EXTENDED RELEASE: 7 days supply | Qty: 14 | Fill #6 | Status: AC

## 2020-05-14 DIAGNOSIS — Z79899 Other long term (current) drug therapy: Principal | ICD-10-CM

## 2020-05-14 DIAGNOSIS — Z940 Kidney transplant status: Principal | ICD-10-CM

## 2020-05-15 ENCOUNTER — Ambulatory Visit (INDEPENDENT_AMBULATORY_CARE_PROVIDER_SITE_OTHER): Payer: Medicare HMO

## 2020-05-15 ENCOUNTER — Other Ambulatory Visit: Payer: Self-pay

## 2020-05-15 DIAGNOSIS — Z23 Encounter for immunization: Secondary | ICD-10-CM

## 2020-05-15 NOTE — Progress Notes (Signed)
Patient presented for Prevnar 13 vaccine to left deltoid, patient voiced no concerns nor showed any signs of distress during injection.

## 2020-05-15 NOTE — Unmapped (Signed)
[facility] Refill Coordination Note    Specialty Medication(s) to be Shipped:   Transplant: Envarsus 4mg     Other medication(s) to be shipped: SODIUM BICARB 650MG      Michele Cisneros, DOB: August 07, 1955  Phone: [phone] (home)       All above HIPAA information was verified with patient.     Was a Nurse, learning disability used for this call? No    Completed refill call assessment today to schedule patient's medication shipment from the [facility] [phone]).       Specialty medication(s) and dose(s) confirmed: Regimen is correct and unchanged.   Changes to medications: Luverta reports no changes at this time.  Changes to insurance: No  Questions for the pharmacist: No    Confirmed patient received Welcome Packet with first shipment. The patient will receive a drug information handout for each medication shipped and additional FDA Medication Guides as required.       DISEASE/MEDICATION-SPECIFIC INFORMATION        N/A    SPECIALTY MEDICATION ADHERENCE     Medication Adherence    Patient reported X missed doses in the last month: 0  Specialty Medication: ENVARSUS XR 4MG   Patient is on additional specialty medications: No  Informant: patient  Confirmed plan for next specialty medication refill: delivery by pharmacy  Refills needed for supportive medications: not needed          Refill Coordination    Has the Patients' Contact Information Changed: No  Is the Shipping Address Different: No           ENVARSUS XR 4 mg: 1 days of medicine on hand          SHIPPING     Shipping address confirmed in Epic.     Delivery Scheduled: Yes, Expected medication delivery date: 12/9.     Medication will be delivered via Next Day Courier to the prescription address in Epic WAM.    Jolene Schimke   [facility] Specialty Technician

## 2020-05-16 MED FILL — ENVARSUS XR 4 MG TABLET,EXTENDED RELEASE: 7 days supply | Qty: 14 | Fill #7 | Status: AC

## 2020-05-16 MED FILL — SODIUM BICARBONATE 650 MG TABLET: 30 days supply | Qty: 60 | Fill #2 | Status: AC

## 2020-05-16 MED FILL — ENVARSUS XR 4 MG TABLET,EXTENDED RELEASE: ORAL | 7 days supply | Qty: 14 | Fill #7

## 2020-05-16 MED FILL — SODIUM BICARBONATE 650 MG TABLET: ORAL | 30 days supply | Qty: 60 | Fill #2

## 2020-05-21 DIAGNOSIS — Z940 Kidney transplant status: Principal | ICD-10-CM

## 2020-05-21 DIAGNOSIS — Z79899 Other long term (current) drug therapy: Principal | ICD-10-CM

## 2020-05-21 NOTE — Unmapped (Signed)
[facility] Refill Coordination Note    Specialty Medication(s) to be Shipped:   Transplant: Envarsus XR 4mg     Other medication(s) to be shipped: No additional medications requested for fill at this time     Michele Cisneros, DOB: [birth year]  Phone: [phone] (home)       All above HIPAA information was verified with patient.     Was a Nurse, learning disability used for this call? No    Completed refill call assessment today to schedule patient's medication shipment from the [facility] [phone]).       Specialty medication(s) and dose(s) confirmed: Regimen is correct and unchanged.   Changes to medications: Michele Cisneros reports no changes at this time.  Changes to insurance: No  Questions for the pharmacist: No    Confirmed patient received Welcome Packet with first shipment. The patient will receive a drug information handout for each medication shipped and additional FDA Medication Guides as required.       DISEASE/MEDICATION-SPECIFIC INFORMATION        N/A    SPECIALTY MEDICATION ADHERENCE     Medication Adherence    Patient reported X missed doses in the last month: 0  Specialty Medication: Envarsus XR 4mg   Patient is on additional specialty medications: No  Patient is on more than two specialty medications: No                Envarsus XR 4 mg: 4 days of medicine on hand         SHIPPING     Shipping address confirmed in Epic.     Delivery Scheduled: Yes, Expected medication delivery date: 05/23/20.     Medication will be delivered via Next Day Courier to the prescription address in Epic WAM.    Michele Cisneros [facility] Specialty Technician

## 2020-05-22 LAB — MICROSCOPIC EXAMINATION
BACTERIA: NONE SEEN
CASTS: NONE SEEN /LPF
EPITHELIAL CELLS (NON RENAL): NONE SEEN /HPF (ref 0–10)
RBC URINE: NONE SEEN /HPF (ref 0–2)
WBC URINE: NONE SEEN /HPF (ref 0–5)

## 2020-05-22 LAB — CBC W/ DIFFERENTIAL
BANDED NEUTROPHILS ABSOLUTE COUNT: 0 10*3/uL (ref 0.0–0.1)
BASOPHILS ABSOLUTE COUNT: 0.1 10*3/uL (ref 0.0–0.2)
BASOPHILS RELATIVE PERCENT: 1 %
EOSINOPHILS ABSOLUTE COUNT: 0.4 10*3/uL (ref 0.0–0.4)
EOSINOPHILS RELATIVE PERCENT: 7 %
HEMATOCRIT: 33.1 % — ABNORMAL LOW (ref 34.0–46.6)
HEMOGLOBIN: 10.9 g/dL — ABNORMAL LOW (ref 11.1–15.9)
IMMATURE GRANULOCYTES: 1 %
LYMPHOCYTES ABSOLUTE COUNT: 0.9 10*3/uL (ref 0.7–3.1)
LYMPHOCYTES RELATIVE PERCENT: 18 %
MEAN CORPUSCULAR HEMOGLOBIN CONC: 32.9 g/dL (ref 31.5–35.7)
MEAN CORPUSCULAR HEMOGLOBIN: 31.7 pg (ref 26.6–33.0)
MEAN CORPUSCULAR VOLUME: 96 fL (ref 79–97)
MONOCYTES ABSOLUTE COUNT: 0.4 10*3/uL (ref 0.1–0.9)
MONOCYTES RELATIVE PERCENT: 7 %
NEUTROPHILS ABSOLUTE COUNT: 3.6 10*3/uL (ref 1.4–7.0)
NEUTROPHILS RELATIVE PERCENT: 66 %
PLATELET COUNT: 151 10*3/uL (ref 150–450)
RED BLOOD CELL COUNT: 3.44 x10E6/uL — ABNORMAL LOW (ref 3.77–5.28)
RED CELL DISTRIBUTION WIDTH: 14.9 % (ref 11.7–15.4)
WHITE BLOOD CELL COUNT: 5.3 10*3/uL (ref 3.4–10.8)

## 2020-05-22 LAB — URINALYSIS
BILIRUBIN UA: NEGATIVE
BLOOD UA: NEGATIVE
GLUCOSE UA: NEGATIVE
KETONES UA: NEGATIVE
LEUKOCYTE ESTERASE UA: NEGATIVE
NITRITE UA: NEGATIVE
PH UA: 7.5 (ref 5.0–7.5)
PROTEIN UA: NEGATIVE
SPECIFIC GRAVITY UA: 1.006 (ref 1.005–1.030)
UROBILINOGEN UA: 0.2 mg/dL (ref 0.2–1.0)

## 2020-05-22 LAB — BASIC METABOLIC PANEL
BLOOD UREA NITROGEN: 20 mg/dL (ref 8–27)
BUN / CREAT RATIO: 13 (ref 12–28)
CALCIUM: 10.1 mg/dL (ref 8.7–10.3)
CHLORIDE: 105 mmol/L (ref 96–106)
CO2: 19 mmol/L — ABNORMAL LOW (ref 20–29)
CREATININE: 1.59 mg/dL — ABNORMAL HIGH (ref 0.57–1.00)
GFR MDRD AF AMER: 40 mL/min/{1.73_m2} — ABNORMAL LOW
GFR MDRD NON AF AMER: 35 mL/min/{1.73_m2} — ABNORMAL LOW
GLUCOSE: 113 mg/dL — ABNORMAL HIGH (ref 65–99)
POTASSIUM: 5.7 mmol/L — ABNORMAL HIGH (ref 3.5–5.2)
SODIUM: 134 mmol/L (ref 134–144)

## 2020-05-22 LAB — MAGNESIUM: MAGNESIUM: 2.4 mg/dL — ABNORMAL HIGH (ref 1.6–2.3)

## 2020-05-22 LAB — PHOSPHORUS: PHOSPHORUS, SERUM: 4.5 mg/dL — ABNORMAL HIGH (ref 3.0–4.3)

## 2020-05-22 MED FILL — ENVARSUS XR 4 MG TABLET,EXTENDED RELEASE: 7 days supply | Qty: 14 | Fill #8 | Status: AC

## 2020-05-22 MED FILL — ENVARSUS XR 4 MG TABLET,EXTENDED RELEASE: ORAL | 7 days supply | Qty: 14 | Fill #8

## 2020-05-23 LAB — TACROLIMUS LEVEL: TACROLIMUS BLOOD: 4 ng/mL (ref 2.0–20.0)

## 2020-05-28 DIAGNOSIS — Z79899 Other long term (current) drug therapy: Principal | ICD-10-CM

## 2020-05-28 DIAGNOSIS — Z940 Kidney transplant status: Principal | ICD-10-CM

## 2020-05-28 MED ORDER — ERGOCALCIFEROL (VITAMIN D2) 1,250 MCG (50,000 UNIT) CAPSULE
ORAL_CAPSULE | ORAL | 2 refills | 28.00000 days | Status: CN
Start: 2020-05-28 — End: 2021-05-28

## 2020-05-28 NOTE — Progress Notes (Signed)
Patient ID: Theresa [name], female    DOB: 02-20-1956, 64 y.o.   MRN: [id number]  HPI Ms Domangue is a 64 y/o female with a history of PVD, HTN, hyperlipidemia, COPD, CAD with NSTEMI, kidney transplant, anemia, previous tobacco use, DM, and chronic heart failure.  Echo report from 04/18/20 reviewed and showed an EF of 60-65% along with mild LVH, moderate LAE, moderate MR and mild AS. Echo done 02/26/16 which showed an EF of 55-60%, mild AS and moderate/severe MR which is worse than previous echo done on 11/24/14. Right-sided pressure elevated at 50 mm Hg. Cardiac catheterization done November 2016.   Admitted 04/18/20 due to acute on chronic HF. Cardiology consult obtained. Lasix had to be held due to AKI. Discharged after 3 days.   Patient presents today for a follow-up visit although hasn't been seen since April 2019. She presents with a chief complaint of minimal shortness of breath upon moderate exertion. She describes this as chronic in nature having been present for several years. She has associated fatigue, cough, headache and chronic pain along with this. She denies any difficulty sleeping, dizziness, abdominal distention, palpitations pedal edema, chest pain or weight gain.   Has had a kidney transplant since she was last here and reports doing well with that.   Past Medical History:  Diagnosis Date   Anemia of chronic disease    Anginal pain ([place])    Carotid arterial disease ([place])    a. 02/2013 U/S: 40-59% bilat ICA stenosis.   Chronic constipation    Chronic diastolic CHF (congestive heart failure) ([place])    a. 10/2013 Echo [facility]): EF 55-60%, mod conc LVH, mod MR, mildly dil LA, mild Ao sclerosis w/o stenosis; b. 02/2016 Echo: EF 55-60%, no rwma, Gr DD, mild AS, mod to sev MR, mildly dil LA, PASP 58mmHg; c. 07/2017 Echo [facility]): EF >55%, mild to mod LVH, Gr2 DD, Ao scl, Mod MR, mod dil LA, nl RV fxn.   Colon polyps    COPD (chronic obstructive pulmonary disease) (HCC)    Coronary artery  disease    a. 05/2013 NSTEMI/PCI: LM 20d, LAD min irregs, LCX small, nl, OM1 nl, RCA dom 22m (2.5x16 Promus DES), PDA1 80p; b. 04/2015 Cath: LM nl, LAD 9m, D1/2 min irregs, LCX 35p/m, OM2/3 min irregs, RCA patent mid stent, RPDA 70ost, RPLB1 30, RPLB2/3 min irregs, EF 55-65%--> Med Rx; c. 06/2017 [facility]): No ischemia/infarct, EF 64%.   Diabetes mellitus    Diabetic neuropathy ([place])    Diabetic retinopathy ([place]) 05/28/2013   Hx bilat retinal detachment, proliferative diab retinopathy and bilat vitreous hemorrhage    Emphysema    ESRD on hemodialysis ([place])    a. [facility] in [place], [place] [phone]/Dr. [name], on a MWF schedule.  She started dialysis in Feb 2014.  Etiology of renal failure not known, likely diabetes.  Has a left upper arm AV graft.   History of bronchitis    Mar 2012   History of pneumonia    June 2012   History of tobacco abuse    a. Quit 2012.   Hyperlipidemia    Hypertension    Moderate to severe mitral insufficiency    a. 10/2013 Echo: EF 55-60%, mod MR; b. 02/2016 Echo: EF 55-60%, mod to sev MR directed posteriorly--felt to be dynamic -worse with volume overload; c. 07/2017 Echo [facility]): EF >55%. Mod MR.   Myocardial infarct [facility]) 05/2013   Peripheral vascular disease ([place])    Sickle cell trait ([place])  Thyroid nodule    [place] 06/2017 due to f/u US in 1 year    Past Surgical History:  Procedure Laterality Date   A/V FISTULAGRAM Left 11/10/2017   Procedure: A/V FISTULAGRAM;  Surgeon: Katha Cabal, MD;  Location: [place] CV [facility];  Service: Cardiovascular;  Laterality: Left;   A/V SHUNTOGRAM Left 02/08/2019   Procedure: A/V SHUNTOGRAM;  Surgeon: Katha Cabal, MD;  Location: [place] [facility];  Service: Cardiovascular;  Laterality: Left;   ABDOMINAL HYSTERECTOMY     2000   CARDIAC CATHETERIZATION     CARDIAC CATHETERIZATION N/A 05/02/2015   Procedure: Left Heart Cath and Coronary Angiography;  Surgeon: [name], MD;  Location:  [place] [facility];  Service: Cardiovascular;  Laterality: N/A;   COLONOSCOPY WITH PROPOFOL N/A 07/08/2016   Procedure: COLONOSCOPY WITH PROPOFOL;  Surgeon: [name], MD;  Location: [facility] ENDOSCOPY;  Service: Endoscopy;  Laterality: N/A;   COLONOSCOPY WITH PROPOFOL N/A 08/20/2017   Procedure: COLONOSCOPY WITH PROPOFOL;  Surgeon: [name], MD;  Location: [facility] ENDOSCOPY;  Service: Gastroenterology;  Laterality: N/A;   colonscopy     CORONARY ANGIOPLASTY  05/28/2014   stent placement to the mid RCA   CYSTOURETHROSCOPY     [facility] urology with stent placement 11 or 05/2019 and stent removal 06/13/19    DILATION AND CURETTAGE OF UTERUS     several in the early 80's   ESOPHAGOGASTRODUODENOSCOPY     2012   ESOPHAGOGASTRODUODENOSCOPY (EGD) WITH PROPOFOL N/A 03/11/2018   Procedure: ESOPHAGOGASTRODUODENOSCOPY (EGD) WITH PROPOFOL;  Surgeon: [name], MD;  Location: [facility] ENDOSCOPY;  Service: Gastroenterology;  Laterality: N/A;   EYE SURGERY     bilateral laser 2012   EYE SURGERY     right   EYE SURGERY     x4 both eyes   GAS INSERTION  09/30/2011   Procedure: INSERTION OF GAS;  Surgeon: [name], MD;  Location: [place];  Service: Ophthalmology;  Laterality: Right;  [id number]   GAS/FLUID EXCHANGE  09/30/2011   Procedure: GAS/FLUID EXCHANGE;  Surgeon: [name], MD;  Location: [place];  Service: Ophthalmology;  Laterality: Right;   KIDNEY TRANSPLANT     03/30/19 Dr. Mickel Baas Thomas/Dr. Cristie Hem Zendel/Dr. Gilford Raid; [facility] transplant Dr. Horald Chestnut will be primary [phone] coordinator Sarah Wynkoop [phone]   KIDNEY TRANSPLANT     03/30/19 [name]   LEFT HEART CATHETERIZATION WITH CORONARY ANGIOGRAM N/A 05/28/2013   Procedure: LEFT HEART CATHETERIZATION WITH CORONARY ANGIOGRAM;  Surgeon: [name], MD;  Location: [facility] CATH LAB;  Service: Cardiovascular;  Laterality: N/A;   PARS PLANA VITRECTOMY  04/22/2011   Procedure: PARS PLANA VITRECTOMY WITH 25 GAUGE;  Surgeon: [name], MD;  Location: [place];  Service: Ophthalmology;  Laterality: Left;  membrane peel, endolaser, gas fluid exchange, silicone oil, repair of complex traction retinal detachment   PARS PLANA VITRECTOMY  09/30/2011   Procedure: PARS PLANA VITRECTOMY WITH 25 GAUGE;  Surgeon: [name], MD;  Location: [place];  Service: Ophthalmology;  Laterality: Right;  Endolaser; Repair of Complex Traction Retinal Detachment   PARS PLANA VITRECTOMY  02/24/2012   Procedure: PARS PLANA VITRECTOMY WITH 25 GAUGE;  Surgeon: [name], MD;  Location: [place];  Service: Ophthalmology;  Laterality: Left;   PTCA     SILICON OIL REMOVAL  09/22/6061   Procedure: SILICON OIL REMOVAL;  Surgeon: [name], MD;  Location: [place];  Service: Ophthalmology;  Laterality:  Left;   THROMBECTOMY / ARTERIOVENOUS GRAFT REVISION     TUBAL LIGATION     1979   UPPER EXTREMITY ANGIOGRAPHY Left 02/08/2019   Procedure: UPPER EXTREMITY ANGIOGRAPHY;  Surgeon: Katha Cabal, MD;  Location: [place] [facility];  Service: Cardiovascular;  Laterality: Left;   Family History  Problem Relation Age of Onset   Stroke Mother    Heart attack Mother    Heart disease Mother    Colon cancer Father    Colon cancer Sister    Heart attack Brother    Stroke Brother    Diabetes Brother    Breast cancer Sister    Diabetes Brother    Diabetes Daughter    Renal Disease Daughter        on HD   Anesthesia problems Neg Hx    Hypotension Neg Hx    Malignant hyperthermia Neg Hx    Pseudochol deficiency Neg Hx    Social History   Tobacco Use   Smoking status: Former Smoker    Packs/day: 1.00    Years: 25.00    Pack years: 25.00    Quit date: 06/09/2010    Years since quitting: 9.9   Smokeless tobacco: Never Used  Substance Use Topics   Alcohol use: No   Allergies  Allergen Reactions   Hydrocodone Hives   Lisinopril     Unknown reaction    Prior to Admission medications   Medication Sig Start  Date End Date Taking? Authorizing Provider  acetaminophen (TYLENOL) 500 MG tablet Take 1,000 mg by mouth 2 (two) times daily. 03/31/19  Yes [provider]  albuterol (PROVENTIL) (2.5 MG/3ML) 0.083% nebulizer solution Take 3 mLs (2.5 mg total) by nebulization every 6 (six) hours as needed for wheezing or shortness of breath. 09/23/18  Yes McLean-Scocuzza, Nino Glow, MD  albuterol (VENTOLIN HFA) 108 (90 Base) MCG/ACT inhaler Inhale 1-2 puffs into the lungs every 6 (six) hours as needed for wheezing or shortness of breath. 09/23/18  Yes McLean-Scocuzza, Nino Glow, MD  amLODipine (NORVASC) 10 MG tablet Take 1 tablet (10 mg total) by mouth daily. 04/22/20  Yes Wouk, Ailene Rud, MD  aspirin EC 81 MG tablet Take 1 tablet (81 mg total) by mouth at bedtime. 12/02/18  Yes Theora Gianotti, NP  atorvastatin (LIPITOR) 20 MG tablet Take 20 mg by mouth daily. 04/16/20  Yes [provider]  carvedilol (COREG) 12.5 MG tablet Take 1 tablet (12.5 mg total) by mouth 2 (two) times daily with a meal. 04/21/20  Yes Wouk, Ailene Rud, MD  cetirizine (ZYRTEC) 5 MG tablet Take 1 tablet (5 mg total) by mouth daily as needed for allergies. 09/23/18  Yes McLean-Scocuzza, Nino Glow, MD  cloNIDine (CATAPRES) 0.1 MG tablet Take 1 tablet (0.1 mg total) by mouth 2 (two) times daily. 04/21/20  Yes Wouk, Ailene Rud, MD  esomeprazole (NEXIUM) 20 MG capsule Take 20 mg by mouth daily. 04/16/20  Yes [provider]  furosemide (LASIX) 20 MG tablet Take 1 tablet (20 mg total) by mouth daily as needed (shortness of breath). 04/27/20  Yes Gollan, Kathlene November, MD  magnesium oxide (MAG-OX) 400 MG tablet Take 400 mg by mouth 2 (two) times daily.  06/24/19 06/23/20 Yes [provider]  nitroGLYCERIN (NITROSTAT) 0.4 MG SL tablet Place 1 tablet (0.4 mg total) under the tongue every 5 (five) minutes as needed for chest pain. 04/27/20  Yes Minna Merritts, MD  pregabalin (LYRICA) 75 MG capsule Take 1 capsule (75  mg  total) by mouth 2 (two) times daily. 06/20/19  Yes McLean-Scocuzza, Nino Glow, MD  pregabalin (LYRICA) 75 MG capsule Take 1 capsule (75mg ) in the AM and 2 capsules (150mg ) in the PM   Yes [provider]  sodium bicarbonate 650 MG tablet Take 650 mg by mouth 2 (two) times daily. 10/13/19 10/12/20 Yes [provider]  Tacrolimus ER 4 MG TB24 Take 8 mg by mouth every morning.    Yes [provider]  UNABLE TO FIND TEST BLOOD SUGARS 2 TIMES DAILY 09/26/13  Yes [provider]  sodium zirconium cyclosilicate (LOKELMA) 10 g PACK packet Take 10 g by mouth daily. Patient not taking: Reported on 05/29/2020 04/22/20   Gwynne Edinger, MD    Review of Systems  Constitutional: Positive for fatigue (minimal). Negative for appetite change.  HENT: Negative for congestion, postnasal drip and sore throat.   Eyes: Negative.   Respiratory: Positive for cough and shortness of breath (minimal). Negative for chest tightness and wheezing.   Cardiovascular: Negative for chest pain, palpitations and leg swelling.  Gastrointestinal: Negative for abdominal distention and abdominal pain.  Endocrine: Negative.   Genitourinary: Negative.   Musculoskeletal: Positive for arthralgias (right hip pain) and back pain. Negative for neck pain.  Skin: Negative.   Allergic/Immunologic: Negative.   Neurological: Positive for headaches (at times when she wakes up). Negative for dizziness, weakness and light-headedness.  Hematological: Negative for adenopathy. Does not bruise/bleed easily.  Psychiatric/Behavioral: Negative for dysphoric mood and sleep disturbance. The patient is not nervous/anxious.    Vitals:   05/29/20 1228  BP: (!) 138/50  Pulse: 71  Resp: 16  SpO2: 100%  Weight: 149 lb 2 oz (67.6 kg)  Height: 5\' 7"  (1.702 m)   Wt Readings from Last 3 Encounters:  05/29/20 149 lb 2 oz (67.6 kg)  04/27/20 150 lb (68 kg)  04/21/20 152 lb 5.4 oz (69.1 kg)   Lab Results  Component Value  Date   CREATININE 1.39 (H) 04/21/2020   CREATININE 1.68 (H) 04/20/2020   CREATININE 1.65 (H) 04/19/2020    Physical Exam Vitals and nursing note reviewed.  Constitutional:      Appearance: She is well-developed.  HENT:     Head: Normocephalic and atraumatic.  Neck:     Vascular: No JVD.  Cardiovascular:     Rate and Rhythm: Normal rate and regular rhythm.  Pulmonary:     Effort: Pulmonary effort is normal.     Breath sounds: No wheezing or rales.  Abdominal:     General: There is no distension.     Palpations: Abdomen is soft.     Tenderness: There is no abdominal tenderness.  Musculoskeletal:        General: No tenderness.     Cervical back: Normal range of motion and neck supple.  Skin:    General: Skin is warm and dry.  Neurological:     Mental Status: She is alert and oriented to person, place, and time.  Psychiatric:        Behavior: Behavior normal.        Thought Content: Thought content normal.      Assessment & Plan:  1: Chronic heart failure with preserved ejection fraction with structural changes (LVH/LAE)-  - NYHA class II - euvolemic  - continue daily weights and call for an overnight weight gain of >2 pounds/weekly weight gain of >5 pounds.  - tries not to salt foods; has been using salt substitute, when  eats canned vegetables will thoroughly rinse them; reminded to closely follow a 2000mg  sodium diet - cardiology Rockey Situ) 04/27/20  - BNP 04/18/20 was 657.1 - reports receiving her flu vaccine for the season along with 2 of her covid vaccines  2: HTN- - BP looks good today - saw PCP (McLean-Scocuzza) 02/24/20 - BMP 04/21/20 reviewed and showed sodium 134, potassium 4.6, creatinine 1.39 and GFR 43  3: Transplant- - has had renal transplant & reports doing well with that   Patient did not bring her medications nor a list. Each medication was verbally reviewed with the patient and she was encouraged to bring the bottles to every visit to confirm  accuracy of list.  Return in 6 months or sooner for any questions/problems before then.

## 2020-05-28 NOTE — Unmapped (Signed)
[facility] Refill Coordination Note    Specialty Medication(s) to be Shipped:   Transplant: Envarsus 4mg     Other medication(s) to be shipped: vitamin D     [name], DOB: [birth year]  Phone: [phone] (home)       All above HIPAA information was verified with patient.     Was a Nurse, learning disability used for this call? No    Completed refill call assessment today to schedule patient's medication shipment from the [facility] [phone]).       Specialty medication(s) and dose(s) confirmed: Regimen is correct and unchanged.   Changes to medications: Shirl reports no changes at this time.  Changes to insurance: No  Questions for the pharmacist: No    Confirmed patient received Welcome Packet with first shipment. The patient will receive a drug information handout for each medication shipped and additional FDA Medication Guides as required.       DISEASE/MEDICATION-SPECIFIC INFORMATION        N/A    SPECIALTY MEDICATION ADHERENCE     Medication Adherence    Patient reported X missed doses in the last month: 0  Specialty Medication: Envarsus 4mg   Patient is on additional specialty medications: No        Envarsus 4 mg: 4 days of medicine on hand     SHIPPING     Shipping address confirmed in Epic.     Delivery Scheduled: Yes, Expected medication delivery date: 05/30/2020.     Medication will be delivered via Next Day Courier to the prescription address in Epic WAM.    [name]   [facility] Specialty Technician

## 2020-05-28 NOTE — Unmapped (Signed)
Pt request for RX Refill ergocalciferol-1,250 mcg, 50,000 unit, (VITAMIN D2-1,250 MCG, 50,000 UNIT,) 1,250 mcg (50,000 unit) capsule

## 2020-05-29 ENCOUNTER — Other Ambulatory Visit: Payer: Self-pay

## 2020-05-29 ENCOUNTER — Encounter: Payer: Self-pay | Admitting: Family

## 2020-05-29 ENCOUNTER — Ambulatory Visit: Payer: Medicare HMO | Attending: Family | Admitting: Family

## 2020-05-29 VITALS — BP 138/50 | HR 71 | Resp 16 | Ht 67.0 in | Wt 149.1 lb

## 2020-05-29 DIAGNOSIS — Z87891 Personal history of nicotine dependence: Secondary | ICD-10-CM | POA: Diagnosis not present

## 2020-05-29 DIAGNOSIS — J449 Chronic obstructive pulmonary disease, unspecified: Secondary | ICD-10-CM | POA: Insufficient documentation

## 2020-05-29 DIAGNOSIS — I5032 Chronic diastolic (congestive) heart failure: Secondary | ICD-10-CM

## 2020-05-29 DIAGNOSIS — I2510 Atherosclerotic heart disease of native coronary artery without angina pectoris: Secondary | ICD-10-CM | POA: Insufficient documentation

## 2020-05-29 DIAGNOSIS — E785 Hyperlipidemia, unspecified: Secondary | ICD-10-CM | POA: Diagnosis not present

## 2020-05-29 DIAGNOSIS — I10 Essential (primary) hypertension: Secondary | ICD-10-CM

## 2020-05-29 DIAGNOSIS — Z7982 Long term (current) use of aspirin: Secondary | ICD-10-CM | POA: Insufficient documentation

## 2020-05-29 DIAGNOSIS — E11319 Type 2 diabetes mellitus with unspecified diabetic retinopathy without macular edema: Secondary | ICD-10-CM | POA: Diagnosis not present

## 2020-05-29 DIAGNOSIS — Z992 Dependence on renal dialysis: Secondary | ICD-10-CM | POA: Diagnosis not present

## 2020-05-29 DIAGNOSIS — I252 Old myocardial infarction: Secondary | ICD-10-CM | POA: Diagnosis not present

## 2020-05-29 DIAGNOSIS — E1151 Type 2 diabetes mellitus with diabetic peripheral angiopathy without gangrene: Secondary | ICD-10-CM | POA: Diagnosis not present

## 2020-05-29 DIAGNOSIS — Z79899 Other long term (current) drug therapy: Secondary | ICD-10-CM | POA: Insufficient documentation

## 2020-05-29 DIAGNOSIS — E1122 Type 2 diabetes mellitus with diabetic chronic kidney disease: Secondary | ICD-10-CM | POA: Insufficient documentation

## 2020-05-29 DIAGNOSIS — Z955 Presence of coronary angioplasty implant and graft: Secondary | ICD-10-CM | POA: Insufficient documentation

## 2020-05-29 DIAGNOSIS — I132 Hypertensive heart and chronic kidney disease with heart failure and with stage 5 chronic kidney disease, or end stage renal disease: Secondary | ICD-10-CM | POA: Insufficient documentation

## 2020-05-29 DIAGNOSIS — I110 Hypertensive heart disease with heart failure: Secondary | ICD-10-CM | POA: Diagnosis present

## 2020-05-29 DIAGNOSIS — Z940 Kidney transplant status: Secondary | ICD-10-CM | POA: Diagnosis not present

## 2020-05-29 DIAGNOSIS — N186 End stage renal disease: Secondary | ICD-10-CM | POA: Insufficient documentation

## 2020-05-29 NOTE — Patient Instructions (Signed)
Continue weighing daily and call for an overnight weight gain of > 2 pounds or a weekly weight gain of >5 pounds. 

## 2020-05-30 MED ORDER — ERGOCALCIFEROL (VITAMIN D2) 1,250 MCG (50,000 UNIT) CAPSULE
ORAL_CAPSULE | ORAL | 2 refills | 28 days | Status: CP
Start: 2020-05-30 — End: 2021-05-30
  Filled 2020-05-30: qty 4, 28d supply, fill #0

## 2020-05-30 MED FILL — ENVARSUS XR 4 MG TABLET,EXTENDED RELEASE: ORAL | 7 days supply | Qty: 14 | Fill #9

## 2020-05-30 MED FILL — ERGOCALCIFEROL (VITAMIN D2) 1,250 MCG (50,000 UNIT) CAPSULE: 28 days supply | Qty: 4 | Fill #0 | Status: AC

## 2020-05-30 MED FILL — ENVARSUS XR 4 MG TABLET,EXTENDED RELEASE: 7 days supply | Qty: 14 | Fill #9 | Status: AC

## 2020-06-04 DIAGNOSIS — Z79899 Other long term (current) drug therapy: Principal | ICD-10-CM

## 2020-06-04 DIAGNOSIS — Z940 Kidney transplant status: Principal | ICD-10-CM

## 2020-06-04 NOTE — Unmapped (Signed)
[facility] Refill Coordination Note    Specialty Medication(s) to be Shipped:   Transplant: Envarsus 4mg     Other medication(s) to be shipped: magnesium and pregabalin     Michele Cisneros, DOB: [birth year]  Phone: [phone] (home)       All above HIPAA information was verified with patient.     Was a Nurse, learning disability used for this call? No    Completed refill call assessment today to schedule patient's medication shipment from the [facility] [phone]).       Specialty medication(s) and dose(s) confirmed: Regimen is correct and unchanged.   Changes to medications: Michele Cisneros reports no changes at this time.  Changes to insurance: No  Questions for the pharmacist: No    Confirmed patient received Welcome Packet with first shipment. The patient will receive a drug information handout for each medication shipped and additional FDA Medication Guides as required.       DISEASE/MEDICATION-SPECIFIC INFORMATION        N/A    SPECIALTY MEDICATION ADHERENCE     Medication Adherence    Patient reported X missed doses in the last month: 0  Specialty Medication: Envarsus 4mg   Patient is on additional specialty medications: No        Envarsus 4 mg: 4 days of medicine on hand     SHIPPING     Shipping address confirmed in Epic.     Delivery Scheduled: Yes, Expected medication delivery date: 06/07/2020.     Medication will be delivered via Next Day Courier to the prescription address in Epic WAM.    [name]   [facility] Specialty Technician

## 2020-06-06 MED FILL — MAGNESIUM OXIDE 400 MG (241.3 MG MAGNESIUM) TABLET: ORAL | 60 days supply | Qty: 120 | Fill #5

## 2020-06-06 MED FILL — PREGABALIN 75 MG CAPSULE: ORAL | 30 days supply | Qty: 90 | Fill #1

## 2020-06-06 MED FILL — PREGABALIN 75 MG CAPSULE: 30 days supply | Qty: 90 | Fill #1 | Status: AC

## 2020-06-06 MED FILL — ENVARSUS XR 4 MG TABLET,EXTENDED RELEASE: ORAL | 30 days supply | Qty: 60 | Fill #10

## 2020-06-06 MED FILL — ENVARSUS XR 4 MG TABLET,EXTENDED RELEASE: 30 days supply | Qty: 60 | Fill #10 | Status: AC

## 2020-06-06 MED FILL — MAGNESIUM OXIDE 400 MG (241.3 MG MAGNESIUM) TABLET: 60 days supply | Qty: 120 | Fill #5 | Status: AC

## 2020-06-11 DIAGNOSIS — Z79899 Other long term (current) drug therapy: Principal | ICD-10-CM

## 2020-06-11 DIAGNOSIS — Z940 Kidney transplant status: Principal | ICD-10-CM

## 2020-06-18 DIAGNOSIS — Z79899 Other long term (current) drug therapy: Principal | ICD-10-CM

## 2020-06-18 DIAGNOSIS — Z940 Kidney transplant status: Principal | ICD-10-CM

## 2020-06-20 NOTE — Unmapped (Signed)
Called patient back after she left VM    Patient reports she fell Sunday. States her legs gave out and she fell back on her buttocks, hit head on the floor, and whole back. Denies any LOC and son was at home when she fell. Feeling sore and is seeing ortho tomorrow for eval.    No blood for the past 2 weeks due to loss of license and lab would not draw labs.    Went over apts next week and will have labs at that apt    Denies any other needs

## 2020-06-21 DIAGNOSIS — S300XXA Contusion of lower back and pelvis, initial encounter: Secondary | ICD-10-CM | POA: Diagnosis not present

## 2020-06-21 DIAGNOSIS — M7918 Myalgia, other site: Secondary | ICD-10-CM | POA: Diagnosis not present

## 2020-06-27 ENCOUNTER — Ambulatory Visit: Payer: Medicare Other | Admitting: Internal Medicine

## 2020-06-29 ENCOUNTER — Encounter: Admit: 2020-06-29 | Discharge: 2020-06-29 | Payer: MEDICARE

## 2020-06-29 ENCOUNTER — Telehealth: Admit: 2020-06-29 | Discharge: 2020-06-29 | Payer: MEDICARE | Attending: Nephrology | Primary: Nephrology

## 2020-06-29 DIAGNOSIS — I5032 Chronic diastolic (congestive) heart failure: Principal | ICD-10-CM

## 2020-06-29 DIAGNOSIS — D849 Immunodeficiency, unspecified: Principal | ICD-10-CM

## 2020-06-29 DIAGNOSIS — I6523 Occlusion and stenosis of bilateral carotid arteries: Principal | ICD-10-CM

## 2020-06-29 MED ORDER — TRAMADOL 50 MG TABLET
ORAL_TABLET | Freq: Three times a day (TID) | ORAL | 0 refills | 10 days | Status: CP | PRN
Start: 2020-06-29 — End: ?

## 2020-06-29 NOTE — Unmapped (Addendum)
Transplant Nephrology Clinic Visit    Assessment/Plan:  Michele Cisneros is a 65 y.o. female s/p deceased donor kidney transplant on 03/30/2019 for native kidney ESRD presumed secondary to diabetic nephropathy. She is evaluated by video visit due to inclement weather. Active medical issues include:      S/P deceased donor kidney transplant with no history of rejection or DSAs.   - Serum creatinine was 1.59 on 05/21/20 and just above baseline (baseline range 0.9-1.4 mg/dL). The modest rise in creatinine occurred in the setting of reduced PO intake related to GI symptoms.   - DSA screens negative (most recently 11/13/210)   - UP/C has been normal, UA unremarkable todoay  - BK viral load negative 10/05/19.      Immunosuppression Management   - Tacrolimus trough level 4.0 on 02/01/20 (target 4-7)    - Continue Envarsus XR 8 mg daily.   - Myfortic was on hold starting 09/12/19 due to leukopenia and is now on hold due to GI symptoms and weight loss.      History of post-transplant UTI:   - Proteus isolated on 11/28/19, 12/26/19,02/27/20 and 03/19/20 with citrobacter also isolated 03/19/20  - S/P Augmentin x 14 days ending 03/16/20 with follow up culture 04/04/20, 04/30/20 and 05/21/20 revealing only mixed flora   - Currently without symptoms of UTI  - Needs renal US of native and transplant kidneys to assure no stones in view of proteus isolates.    Leukopenia, likely medication related, resolved off Myfortic  - WBC 5.3 on 05/21/20    History of CMV viremia  - Viral load negative 04/11/20   - On no therapy currently.    Hypertension, with postural hypotension and syncope   - BP remains <120 systolic at home despite tapering off clonidine, stopping amlodipine and reducing carvedilol dose.   - Will stop carvedilol today due to postural hypotension.    Coronary artery disease, s/p RCA stent 2015, history of HFpEF secondary to diastolic dysfunction/mitral regurgitation (moderate to severe)  - no symptoms of cardiac ischemia presently   - Muscle weakness has persisted since atorvastatin was reduced to 20 mg daily.  - Continue aspirin.   - Stop carvedilol due to postural hypotension.   - DOE may be due to MR but there are no other findings of heart failure.  - Will plan periodic repeat echocardiogram (last echo 03/29/19)    Esophageal stenosis, history of esophageal ulcers:   - Appetite is poor and she has lost weight.   - S/P dilation 07/06/19 with resolution of ulcers on EGD that date compared to 05/04/19 EGD  - She remains on Nexium  - Will plan EGD at same time of upcoming planned colonoscopy.    Pulmonary Nodule with History of Smoking Associated COPD:   - Subcentimeter nodule seen on NM Stress 07/07/17 and CT chest 05/02/19 stable from previous studies 03/18/16 and 03/07/14  - Needs periodic low dose CT to screen for lung CA, now due and will be scheduled.     Lumbar Radiculopathy Secondary to L5-S1 disc disease Peripheral Neuropathy secondary to DM   - Following with [facility]  - Will see ortho at [facility]  - Continue Lyrica.    Sacral pain secondary to fall    - x-rays negative for fracture  - given refill on tramadol today    Thyroid Nodule:   - [place] 02/01/20 revealed no nodules suggestive of cancer     DM, type II, controlled:  - Metformin was stopped due  to intermittent diarrhea and excellent control  - Blood glucose control has remained excellent    Post-Transplant Anemia:  - Hgb 10.9 on 05/21/20.  - No current indications for ESA therapy    Health Maintenance:   Mammo: 03/12/20 in [place]  Pap Smear: s/p hysterectomy  Colonoscopy: repeat due in March 2022 due to multiple tubular adenomas, will plan EGD at same time  Lung cancer screening: now due for low dose CT   Renal US: now due    Immunizations:   Flu: 04/04/20  Prevnar 13: not noted in records.  Will get after 1 year post transplant.  Pneumovax: 03/23/18  COVID-19: Patient has completed 3 doses of COVID-19 vaccine. She is aware of limited efficacy of the vaccine.   She needs COVID-19 spike IgG checked next [facility] lab draw.     Follow-Up: Return to clinic in 2 months. Patient needs EGD, echocardiogram, and chest CT scheduled. She is already scheduled for a colonoscopy.    Interval History:   She presents today complaining of sacral pain following a fall on 06/21/20. The fall was not associated with loss of consciousness. X-rays of the lumbar spine and coccyx were negative for fracture. She has been taking tramadol for pain control. Her pain remains severe and limits her mobility due to pain. She also has peripheral neuropathy pain and lower extremity weakness. She notes dizziness and sometimes headache upon standing. Antihypertensive therapy is now on carvedilol 6.25 mg bid. BP remains <130/<80.  Hand tremors are stable.     Her other primary complaint is poor appetite and weight loss. Her weight has declined to 143 lbs (151 lbs 04/11/20).  She denies nausea, vomiting or abdominal pain. These symptoms have persisted even after stopping metformin and myfortic. She denies fever or chills, cough, or shortness of breath. She has not had UTI since October 2021 and denies dysuria or allograft tenderness. Blood glucoses have been normal.      She has completed 3 doses of COVID-19 vaccine and had her flu vaccine. She continues to smoke.    Transplant History:    Native Kidney Disease: Diabetic nephropathy   Pre-transplant PRA: 70%   Organ Received: DDKT, DBD, KDPI: 70%  CMV/EBV Status: CMV D-/R+  EBV D+/R+  Resp culture: + strep pneumo  Date of Transplant: 03/30/2019  Implantation biopsy: mild ATN, otherwise normal.  Post-transplant complication - DGF  Induction: Campath with rapid steroid discontinuation  Maintenance Immunosuppression: Tacrolimus/Myfotic  Date of Ureteral Stent Removal: 06/13/2019  Current Immunosuppression: Tacrolimus/Myfortic  Results of Renal Imaging (pre and post):      Pre-Txp 07/07/17  -Kidneys are small with mildly increased echogenicity bilaterally. No solid masses or calculi in the visualized kidneys. No hydronephrosis    CT Abdomen/Pelvis w/o Contrast 03/29/2019 (pre-txp)  -1.2 cm hypodensity in the right kidney lower pole (2:48), increased in size from prior however incompletely characterized given lack of intravenous contrast.  -Uterus is surgically absent.    Post-Txp 05/08/2019 (txp kidney only)  --The renal transplant was located in the left lower quadrant. Normal size and echogenicity.  No solid masses or calculi. No perinephric collections identified. Double-J ureteral stent with proximal tip within the renal pelvis and distal tip within the bladder. Mild caliectasis. No hydronephrosis.  --Interval increase in resistive indices within the arcuate, segmental and main renal arteries measuring up to 0.98. Attention on follow-up.  --Improved perfusion in the upper pole of the transplant kidney, now with normal perfusion throughout the renal parenchyma.  Other Past Medical History  1. Diabetes mellitus, type 2, diagnosed 2012.   2. Diabetic peripheral neuropathy  3. Diabetic retinopathy, s/p laser surgery, s/p retinal detachment  4. Hypertension, diagnosed 2012.   5. Mixed hyperlipidemia.   6. Coronary artery disease, s/p NSTEMI 05/2013, PCI RCA, 20% KNm 80% PDA 1. Cardiac cath 05/02/15 LAD 20% and 30% lesions, Cx 35%, RPDA 70%, patent right coronary stent  7. HFpEF with EF 65-70% on echo 03/29/19 with  moderate to severe MR  8. COPD, emphysema secondary to smoking  9. Peripheral artery disease  10. Carotid artery stenosis 1-39% bilateral stenosis 12/2017  10. Sickle Cell Trait  11. S/P TAH/BSO 2000  12. Lumbar radiculopathy secondary to L5-S1 disc bulge   13. History of thyroid nodule  14. Colonic polyps removed 08/10/17, pathology tubular adenomas,  follow up in 3 years  15. Right breast soft tissue mass noted 07/07/17 stable in appearance compared to past imaging  16. Pulmonary nodule noted 07/07/17 similar in size to lesion as far back as 2015.   17. OSA on CPAP    Review of Systems    Otherwise as per HPI, all other systems reviewed and are negative.    Medications  Current Outpatient Medications   Medication Sig Dispense Refill   ??? acetaminophen (TYLENOL) 500 MG tablet Take 1-2 tablets (500-1,000 mg total) by mouth every six (6) hours as needed for pain or fever (> 38C or 100.37F). 30 tablet 0   ??? albuterol (PROVENTIL HFA;VENTOLIN HFA) 90 mcg/actuation inhaler Inhale 2 puffs every six (6) hours as needed for wheezing.     ??? aspirin (ECOTRIN) 81 MG tablet Take 81 mg by mouth daily.     ??? atorvastatin (LIPITOR) 20 MG tablet Take 1 tablet (20 mg total) by mouth daily. 90 tablet 3   ??? blood sugar diagnostic (CONTOUR TEST STRIPS) Strp by Other route two (2) times a day. TEST BLOOD SUGARS 2 TIMES DAILY 60 strip 5   ??? blood-glucose meter (ONETOUCH ULTRA SYSTEM KIT) kit Please dispense for patient to use at home to check blood sugars.     ??? carvediloL (COREG) 6.25 MG tablet Take 1 tablet (6.25 mg total) by mouth Two (2) times a day. 180 tablet 3   ??? ergocalciferol-1,250 mcg, 50,000 unit, (VITAMIN D2-1,250 MCG, 50,000 UNIT,) 1,250 mcg (50,000 unit) capsule Take 1 capsule (1,250 mcg total) by mouth once a week. Take for 8 weeks as directed. 4 capsule 2   ??? esomeprazole (NEXIUM) 20 MG capsule Take 1 capsule (20 mg total) by mouth daily. 90 capsule 3   ??? LANCETS,THIN MISC Using at home to test blood sugars twice daily.     ??? magnesium oxide (MAG-OX) 400 mg (241.3 mg elemental magnesium) tablet Take 1 tablet (400 mg total) by mouth Two (2) times a day. 120 tablet 11   ??? melatonin 3 mg Tab Take 2 tablets (6 mg total) by mouth nightly as needed.  0   ??? mycophenolate (MYFORTIC) 180 MG EC tablet HOLD (Patient not taking: Reported on 02/15/2020) 120 tablet 11   ??? nitroglycerin (NITROSTAT) 0.4 MG SL tablet Place 0.4 mg under the tongue daily as needed.     ??? pregabalin (LYRICA) 75 MG capsule Take 1 capsule (75 mg total) by mouth every morning AND 2 capsules (150 mg total) nightly. 90 capsule 3   ??? sodium bicarbonate 650 mg tablet Take 1 tablet (650 mg total) by mouth Two (2) times a day. 60 tablet 11   ???  tacrolimus (ENVARSUS XR) 4 mg Tb24 extended release tablet Take 2 tablets (8 mg total) by mouth every morning. 60 tablet 11     No current facility-administered medications for this visit.           Physical Exam  She appears in some discomfort related to sacral pain. She appears in no respiratory distress.    Laboratory Data and Imaging Reviewed    Video Visit:      The patient reports they are currently: at home. I spent 28 minutes on the real-time audio and video with the patient on the date of service. I spent an additional 20 minutes on pre- and post-visit activities on the date of service. I was present in the [facility] building during this visit.    The patient was physically located in [place] or a state in which I am permitted to provide care. The patient and/or parent/guardian understood that s/he may incur co-pays and cost sharing, and agreed to the telemedicine visit. The visit was reasonable and appropriate under the circumstances given the patient's presentation at the time.    The patient and/or parent/guardian has been advised of the potential risks and limitations of this mode of treatment (including, but not limited to, the absence of in-person examination) and has agreed to be treated using telemedicine. The patient's/patient's family's questions regarding telemedicine have been answered.     If the visit was completed in an ambulatory setting, the patient and/or parent/guardian has also been advised to contact their provider???s office for worsening conditions, and seek emergency medical treatment and/or call 911 if the patient deems either necessary.

## 2020-07-02 DIAGNOSIS — Z940 Kidney transplant status: Principal | ICD-10-CM

## 2020-07-06 NOTE — Unmapped (Signed)
[facility] Refill Coordination Note    Specialty Medication(s) to be Shipped:   Transplant: Envarsus 4mg     Other medication(s) to be shipped: atorvastatin, vitamin D, esomeprazole, pregabalin and sodium bicarb     Michele Cisneros, DOB: 1955-07-28  Phone: [phone] (home)       All above HIPAA information was verified with patient.     Was a Nurse, learning disability used for this call? No    Completed refill call assessment today to schedule patient's medication shipment from the [facility] [phone]).       Specialty medication(s) and dose(s) confirmed: Regimen is correct and unchanged.   Changes to medications: Michele Cisneros reports no changes at this time.  Changes to insurance: No  Questions for the pharmacist: No    Confirmed patient received Welcome Packet with first shipment. The patient will receive a drug information handout for each medication shipped and additional FDA Medication Guides as required.       DISEASE/MEDICATION-SPECIFIC INFORMATION        N/A    SPECIALTY MEDICATION ADHERENCE     Medication Adherence    Patient reported X missed doses in the last month: 0  Specialty Medication: Envarsus 4mg   Patient is on additional specialty medications: No        Envarsus 4 mg: 4 days of medicine on hand     SHIPPING     Shipping address confirmed in Epic.     Delivery Scheduled: Yes, Expected medication delivery date: 07/09/2020.     Medication will be delivered via Same Day Courier to the prescription address in Epic WAM.    Michele Cisneros   [facility] Specialty Technician

## 2020-07-09 DIAGNOSIS — Z940 Kidney transplant status: Principal | ICD-10-CM

## 2020-07-09 MED FILL — ESOMEPRAZOLE MAGNESIUM 20 MG CAPSULE,DELAYED RELEASE: ORAL | 90 days supply | Qty: 90 | Fill #1

## 2020-07-09 MED FILL — SODIUM BICARBONATE 650 MG TABLET: ORAL | 30 days supply | Qty: 60 | Fill #3

## 2020-07-09 MED FILL — ERGOCALCIFEROL (VITAMIN D2) 1,250 MCG (50,000 UNIT) CAPSULE: ORAL | 28 days supply | Qty: 4 | Fill #1

## 2020-07-09 MED FILL — ATORVASTATIN 20 MG TABLET: ORAL | 90 days supply | Qty: 90 | Fill #1

## 2020-07-09 MED FILL — PREGABALIN 75 MG CAPSULE: ORAL | 30 days supply | Qty: 90 | Fill #2

## 2020-07-09 MED FILL — ENVARSUS XR 4 MG TABLET,EXTENDED RELEASE: ORAL | 7 days supply | Qty: 14 | Fill #11

## 2020-07-16 DIAGNOSIS — Z940 Kidney transplant status: Principal | ICD-10-CM

## 2020-07-16 MED FILL — ENVARSUS XR 4 MG TABLET,EXTENDED RELEASE: ORAL | 30 days supply | Qty: 60 | Fill #12

## 2020-07-16 NOTE — Unmapped (Signed)
Michele Cisneros    Michele Cisneros, DOB: 04/02/1956  Phone: [phone] (home)     All above HIPAA information was verified with patient.     Was a Nurse, learning disability used for this call? No    Specialty Medication(s):   Transplant: Envarsus 4mg      Current Outpatient Medications   Medication Sig Dispense Refill   ??? acetaminophen (TYLENOL) 500 MG tablet Take 1-2 tablets (500-1,000 mg total) by mouth every six (6) hours as needed for pain or fever (> 38C or 100.41F). 30 tablet 0   ??? albuterol (PROVENTIL HFA;VENTOLIN HFA) 90 mcg/actuation inhaler Inhale 2 puffs every six (6) hours as needed for wheezing.     ??? aspirin (ECOTRIN) 81 MG tablet Take 81 mg by mouth daily.     ??? atorvastatin (LIPITOR) 20 MG tablet Take 1 tablet (20 mg total) by mouth daily. 90 tablet 3   ??? blood sugar diagnostic (CONTOUR TEST STRIPS) Strp by Other route two (2) times a day. TEST BLOOD SUGARS 2 TIMES DAILY 60 strip 5   ??? blood-glucose meter (ONETOUCH ULTRA SYSTEM KIT) kit Please dispense for patient to use at home to check blood sugars.     ??? carvediloL (COREG) 6.25 MG tablet Take 1 tablet (6.25 mg total) by mouth Two (2) times a day. 180 tablet 3   ??? ergocalciferol-1,250 mcg, 50,000 unit, (VITAMIN D2-1,250 MCG, 50,000 UNIT,) 1,250 mcg (50,000 unit) capsule Take 1 capsule (1,250 mcg total) by mouth once a week. Take for 8 weeks as directed. 4 capsule 2   ??? esomeprazole (NEXIUM) 20 MG capsule Take 1 capsule (20 mg total) by mouth daily. 90 capsule 3   ??? LANCETS,THIN MISC Using at home to test blood sugars twice daily.     ??? magnesium oxide (MAG-OX) 400 mg (241.3 mg elemental magnesium) tablet Take 1 tablet (400 mg total) by mouth Two (2) times a day. 120 tablet 11   ??? melatonin 3 mg Tab Take 2 tablets (6 mg total) by mouth nightly as needed.  0   ??? mycophenolate (MYFORTIC) 180 MG EC tablet HOLD (Patient not taking: Reported on 02/15/2020) 120 tablet 11   ??? nitroglycerin (NITROSTAT) 0.4 MG SL tablet Place 0.4 mg under the tongue daily as needed.     ??? pregabalin (LYRICA) 75 MG capsule Take 1 capsule (75 mg total) by mouth every morning AND 2 capsules (150 mg total) nightly. 90 capsule 3   ??? sodium bicarbonate 650 mg tablet Take 1 tablet (650 mg total) by mouth Two (2) times a day. 60 tablet 11   ??? tacrolimus (ENVARSUS XR) 4 mg Tb24 extended release tablet Take 2 tablets (8 mg total) by mouth every morning. 60 tablet 11   ??? traMADoL (ULTRAM) 50 mg tablet Take 1 tablet (50 mg total) by mouth every eight (8) hours as needed for pain. 30 tablet 0     No current facility-administered medications for this visit.        Changes to medications: Ariauna reports no changes at this time.    Allergies   Allergen Reactions   ??? Hydrocodone Hives   ??? Lisinopril      Unknown reaction    ??? Perfume        Changes to allergies: No    SPECIALTY MEDICATION ADHERENCE     Envarsus Xr 4 mg: 0 days of medicine on hand       Medication Adherence  Patient reported X missed doses in the last month: 0  Specialty Medication: Envarsus Xr 4mg   Patient is on additional specialty medications: No          Specialty medication(s) dose(s) confirmed: Regimen is correct and unchanged.     Are there any concerns with adherence? No    Adherence counseling provided? Not needed    CLINICAL MANAGEMENT AND INTERVENTION      Clinical Benefit Assessment:    Do you feel the medicine is effective or helping your condition? Yes    Clinical Benefit counseling provided? Not needed    Adverse Effects Assessment:    Are you experiencing any side effects? No    Are you experiencing difficulty administering your medicine? No    Quality of Life Assessment:    How many days over the past month did your kidney transplant  keep you from your normal activities? For example, brushing your teeth or getting up in the morning. 0    Have you discussed this with your provider? Not needed    Therapy Appropriateness:    Is therapy appropriate? Yes, therapy is appropriate and should be continued    DISEASE/MEDICATION-SPECIFIC INFORMATION      N/A    PATIENT SPECIFIC NEEDS     - Does the patient have any physical, cognitive, or cultural barriers? No    - Is the patient high risk? Yes, patient is taking a REMS drug. Medication is dispensed in compliance with REMS program    - Does the patient require a Care Management Plan? No     - Does the patient require physician intervention or other additional services (i.e. nutrition, smoking cessation, social work)? No      SHIPPING     Specialty Medication(s) to be Shipped:   Transplant: Envarsus 4mg     Other medication(s) to be shipped: No additional medications requested for fill at this time     Changes to insurance: No    Delivery Scheduled: Yes, Expected medication delivery date: 07/16/20.     Medication will be delivered via Same Day Courier to the confirmed prescription address in [facility].    The patient will receive a drug information handout for each medication shipped and additional FDA Medication Guides as required.  Verified that patient has previously received a Conservation officer, historic buildings.    All of the patient's questions and concerns have been addressed.    Tera Helper   [facility] Specialty Pharmacist

## 2020-07-19 DIAGNOSIS — Z940 Kidney transplant status: Secondary | ICD-10-CM | POA: Diagnosis not present

## 2020-07-20 LAB — BASIC METABOLIC PANEL
BLOOD UREA NITROGEN: 15 mg/dL (ref 8–27)
BUN / CREAT RATIO: 10 — ABNORMAL LOW (ref 12–28)
CALCIUM: 10 mg/dL (ref 8.7–10.3)
CHLORIDE: 99 mmol/L (ref 96–106)
CO2: 21 mmol/L (ref 20–29)
CREATININE: 1.52 mg/dL — ABNORMAL HIGH (ref 0.57–1.00)
GFR MDRD AF AMER: 42 mL/min/{1.73_m2} — ABNORMAL LOW
GFR MDRD NON AF AMER: 36 mL/min/{1.73_m2} — ABNORMAL LOW
GLUCOSE: 98 mg/dL (ref 65–99)
POTASSIUM: 5 mmol/L (ref 3.5–5.2)
SODIUM: 134 mmol/L (ref 134–144)

## 2020-07-20 LAB — CBC W/ DIFFERENTIAL
BANDED NEUTROPHILS ABSOLUTE COUNT: 0 10*3/uL (ref 0.0–0.1)
BASOPHILS ABSOLUTE COUNT: 0 10*3/uL (ref 0.0–0.2)
BASOPHILS RELATIVE PERCENT: 1 %
EOSINOPHILS ABSOLUTE COUNT: 0.1 10*3/uL (ref 0.0–0.4)
EOSINOPHILS RELATIVE PERCENT: 1 %
HEMATOCRIT: 42.4 % (ref 34.0–46.6)
HEMOGLOBIN: 14.3 g/dL (ref 11.1–15.9)
IMMATURE GRANULOCYTES: 0 %
LYMPHOCYTES ABSOLUTE COUNT: 0.7 10*3/uL (ref 0.7–3.1)
LYMPHOCYTES RELATIVE PERCENT: 10 %
MEAN CORPUSCULAR HEMOGLOBIN CONC: 33.7 g/dL (ref 31.5–35.7)
MEAN CORPUSCULAR HEMOGLOBIN: 32 pg (ref 26.6–33.0)
MEAN CORPUSCULAR VOLUME: 95 fL (ref 79–97)
MONOCYTES ABSOLUTE COUNT: 0.3 10*3/uL (ref 0.1–0.9)
MONOCYTES RELATIVE PERCENT: 5 %
NEUTROPHILS ABSOLUTE COUNT: 5.5 10*3/uL (ref 1.4–7.0)
NEUTROPHILS RELATIVE PERCENT: 83 %
PLATELET COUNT: 130 10*3/uL — ABNORMAL LOW (ref 150–450)
RED BLOOD CELL COUNT: 4.47 x10E6/uL (ref 3.77–5.28)
RED CELL DISTRIBUTION WIDTH: 14.6 % (ref 11.7–15.4)
WHITE BLOOD CELL COUNT: 6.6 10*3/uL (ref 3.4–10.8)

## 2020-07-20 LAB — MAGNESIUM: MAGNESIUM: 2.7 mg/dL — ABNORMAL HIGH (ref 1.6–2.3)

## 2020-07-20 LAB — PHOSPHORUS: PHOSPHORUS, SERUM: 3.6 mg/dL (ref 3.0–4.3)

## 2020-07-23 LAB — TACROLIMUS LEVEL: TACROLIMUS BLOOD: 4.2 ng/mL (ref 2.0–20.0)

## 2020-07-30 ENCOUNTER — Ambulatory Visit: Payer: Medicare HMO | Admitting: Podiatry

## 2020-07-30 DIAGNOSIS — Z940 Kidney transplant status: Principal | ICD-10-CM

## 2020-07-31 NOTE — Unmapped (Signed)
Outreached to patient to schedule visit to address the patient's problem list.  pts pcp is with [facility]. Transferred to clinic to schedule.

## 2020-08-06 ENCOUNTER — Other Ambulatory Visit: Payer: Self-pay

## 2020-08-06 ENCOUNTER — Encounter: Payer: Self-pay | Admitting: Podiatry

## 2020-08-06 ENCOUNTER — Ambulatory Visit (INDEPENDENT_AMBULATORY_CARE_PROVIDER_SITE_OTHER): Payer: Medicare Other | Admitting: Podiatry

## 2020-08-06 DIAGNOSIS — B351 Tinea unguium: Secondary | ICD-10-CM | POA: Diagnosis not present

## 2020-08-06 DIAGNOSIS — E1142 Type 2 diabetes mellitus with diabetic polyneuropathy: Secondary | ICD-10-CM | POA: Diagnosis not present

## 2020-08-06 DIAGNOSIS — M79674 Pain in right toe(s): Secondary | ICD-10-CM | POA: Diagnosis not present

## 2020-08-06 DIAGNOSIS — I739 Peripheral vascular disease, unspecified: Secondary | ICD-10-CM

## 2020-08-06 DIAGNOSIS — Z79899 Other long term (current) drug therapy: Secondary | ICD-10-CM | POA: Diagnosis not present

## 2020-08-06 DIAGNOSIS — Z940 Kidney transplant status: Secondary | ICD-10-CM | POA: Diagnosis not present

## 2020-08-06 DIAGNOSIS — M79675 Pain in left toe(s): Secondary | ICD-10-CM

## 2020-08-06 DIAGNOSIS — L608 Other nail disorders: Secondary | ICD-10-CM

## 2020-08-06 MED ORDER — MAGNESIUM OXIDE 400 MG (241.3 MG MAGNESIUM) TABLET
ORAL_TABLET | Freq: Two times a day (BID) | ORAL | 11 refills | 60 days | Status: CP
Start: 2020-08-06 — End: 2021-08-06
  Filled 2020-08-08: qty 120, 60d supply, fill #0

## 2020-08-06 NOTE — Progress Notes (Signed)
This patient returns to my office for at risk foot care.  This patient requires this care by a professional since this patient will be at risk due to having kidney transplant, PAD, and diabetes.  This patient is unable to cut nails herself since the patient cannot reach her nails.These nails are painful walking and wearing shoes.  This patient presents for at risk foot care today.  General Appearance  Alert, conversant and in no acute stress.  Vascular  Dorsalis pedis and posterior tibial  pulses are palpable  bilaterally.  Capillary return is within normal limits  bilaterally. Temperature is within normal limits  bilaterally.  Neurologic  Senn-Weinstein monofilament wire test within normal limits  bilaterally. Muscle power within normal limits bilaterally.  Nails Thick disfigured discolored nails with subungual debris  from hallux to fifth toes bilaterally. No evidence of bacterial infection or drainage bilaterally.  Orthopedic  No limitations of motion  feet .  No crepitus or effusions noted.  No bony pathology or digital deformities noted.  HAV  B/L.  Skin  normotropic skin with no porokeratosis noted bilaterally.  No signs of infections or ulcers noted.     Onychomycosis  Pain in right toes  Pain in left toes  Consent was obtained for treatment procedures.   Mechanical debridement of nails 1-5  bilaterally performed with a nail nipper.  Filed with dremel without incident.    Return office visit   3 months.                  Told patient to return for periodic foot care and evaluation due to potential at risk complications.   Gardiner Barefoot DPM

## 2020-08-06 NOTE — Unmapped (Signed)
[facility] Refill Coordination Note    Specialty Medication(s) to be Shipped:   Transplant: Envarsus 4mg     Other medication(s) to be shipped: vitamin d-50,000 units, magnesium oxide 400mg  (faxed transplant coordinator for refills), pregabalin 75mg , sodium bicarbonate 650mg      Michele Cisneros, DOB: [birth year]  Phone: [phone] (home)       All above HIPAA information was verified with patient.     Was a Nurse, learning disability used for this call? No    Completed refill call assessment today to schedule patient's medication shipment from the [facility] [phone]).       Specialty medication(s) and dose(s) confirmed: Regimen is correct and unchanged.   Changes to medications: Karen reports no changes at this time.  Changes to insurance: No  Questions for the pharmacist: No    Confirmed patient received Welcome Packet with first shipment. The patient will receive a drug information handout for each medication shipped and additional FDA Medication Guides as required.       DISEASE/MEDICATION-SPECIFIC INFORMATION        N/A    SPECIALTY MEDICATION ADHERENCE     Medication Adherence    Patient reported X missed doses in the last month: 0  Specialty Medication: Envarsus 4mg   Patient is on additional specialty medications: No  Informant: patient                Envarsus 4 mg: 10 days of medicine on hand         SHIPPING     Shipping address confirmed in Epic.     Delivery Scheduled: Yes, Expected medication delivery date: 08/09/20.     Medication will be delivered via Next Day Courier to the prescription address in Epic WAM.    Jasper Loser   [facility] Specialty Technician

## 2020-08-07 LAB — URINALYSIS
BILIRUBIN UA: NEGATIVE
BLOOD UA: NEGATIVE
GLUCOSE UA: NEGATIVE
KETONES UA: NEGATIVE
LEUKOCYTE ESTERASE UA: NEGATIVE
NITRITE UA: NEGATIVE
PH UA: 7 (ref 5.0–7.5)
PROTEIN UA: NEGATIVE
SPECIFIC GRAVITY UA: 1.01 (ref 1.005–1.030)
UROBILINOGEN UA: 0.2 mg/dL (ref 0.2–1.0)

## 2020-08-07 LAB — MICROSCOPIC EXAMINATION
CASTS: NONE SEEN /LPF
WBC URINE: NONE SEEN /HPF (ref 0–5)

## 2020-08-08 LAB — TACROLIMUS LEVEL: TACROLIMUS BLOOD: 4.7 ng/mL (ref 2.0–20.0)

## 2020-08-08 MED FILL — ENVARSUS XR 4 MG TABLET,EXTENDED RELEASE: ORAL | 30 days supply | Qty: 60 | Fill #13

## 2020-08-08 MED FILL — ERGOCALCIFEROL (VITAMIN D2) 1,250 MCG (50,000 UNIT) CAPSULE: ORAL | 28 days supply | Qty: 4 | Fill #2

## 2020-08-08 MED FILL — PREGABALIN 75 MG CAPSULE: ORAL | 30 days supply | Qty: 90 | Fill #3

## 2020-08-08 MED FILL — SODIUM BICARBONATE 650 MG TABLET: ORAL | 30 days supply | Qty: 60 | Fill #4

## 2020-08-13 DIAGNOSIS — Z940 Kidney transplant status: Principal | ICD-10-CM

## 2020-08-27 DIAGNOSIS — Z940 Kidney transplant status: Principal | ICD-10-CM

## 2020-08-29 MED ORDER — ERGOCALCIFEROL (VITAMIN D2) 1,250 MCG (50,000 UNIT) CAPSULE
ORAL_CAPSULE | ORAL | 2 refills | 28 days | Status: CP
Start: 2020-08-29 — End: 2021-08-29
  Filled 2020-09-04: qty 4, 28d supply, fill #0

## 2020-08-29 MED ORDER — PREGABALIN 75 MG CAPSULE
ORAL_CAPSULE | ORAL | 3 refills | 30.00000 days | Status: CN
Start: 2020-08-29 — End: ?

## 2020-08-29 NOTE — Unmapped (Signed)
[facility] Refill Coordination Note    Specialty Medication(s) to be Shipped:   Transplant: Envarsus 4mg     Other medication(s) to be shipped: vitamin D, sodium bicarb and pregabalin     Michele Cisneros, DOB: [birth year]  Phone: [phone] (home)       All above HIPAA information was verified with patient.     Was a Nurse, learning disability used for this call? No    Completed refill call assessment today to schedule patient's medication shipment from the [facility] [phone]).       Specialty medication(s) and dose(s) confirmed: Regimen is correct and unchanged.   Changes to medications: Michele Cisneros reports no changes at this time.  Changes to insurance: No  Questions for the pharmacist: No    Confirmed patient received a Conservation officer, historic buildings and a Surveyor, mining with first shipment. The patient will receive a drug information handout for each medication shipped and additional FDA Medication Guides as required.       DISEASE/MEDICATION-SPECIFIC INFORMATION        N/A    SPECIALTY MEDICATION ADHERENCE     Medication Adherence    Patient reported X missed doses in the last month: 0  Specialty Medication: Envarsus 4mg   Patient is on additional specialty medications: No  Patient is on more than two specialty medications: No        Envarsus 4 mg: 9 days of medicine on hand     SHIPPING     Shipping address confirmed in Epic.     Delivery Scheduled: Yes, Expected medication delivery date: 09/05/2020.     Medication will be delivered via Next Day Courier to the prescription address in Epic WAM.    [name]   [facility] Specialty Technician

## 2020-08-29 NOTE — Unmapped (Signed)
Pt request for RX Refill ergocalciferol-1,250 mcg, 50,000 unit, (VITAMIN D2-1,250 MCG, 50,000 UNIT,) 1,250 mcg (50,000 unit) capsule

## 2020-08-30 MED ORDER — PREGABALIN 75 MG CAPSULE
ORAL_CAPSULE | ORAL | 3 refills | 30.00000 days | Status: CP
Start: 2020-08-30 — End: ?
  Filled 2020-09-04: qty 90, 30d supply, fill #0

## 2020-09-03 DIAGNOSIS — Z940 Kidney transplant status: Secondary | ICD-10-CM | POA: Diagnosis not present

## 2020-09-03 LAB — CBC W/ DIFFERENTIAL
BANDED NEUTROPHILS ABSOLUTE COUNT: 0 10*3/uL (ref 0.0–0.1)
BASOPHILS ABSOLUTE COUNT: 0 10*3/uL (ref 0.0–0.2)
BASOPHILS RELATIVE PERCENT: 1 %
EOSINOPHILS ABSOLUTE COUNT: 0.4 10*3/uL (ref 0.0–0.4)
EOSINOPHILS RELATIVE PERCENT: 7 %
HEMATOCRIT: 39.3 % (ref 34.0–46.6)
HEMOGLOBIN: 13.2 g/dL (ref 11.1–15.9)
IMMATURE GRANULOCYTES: 0 %
LYMPHOCYTES ABSOLUTE COUNT: 0.8 10*3/uL (ref 0.7–3.1)
LYMPHOCYTES RELATIVE PERCENT: 14 %
MEAN CORPUSCULAR HEMOGLOBIN CONC: 33.6 g/dL (ref 31.5–35.7)
MEAN CORPUSCULAR HEMOGLOBIN: 32 pg (ref 26.6–33.0)
MEAN CORPUSCULAR VOLUME: 95 fL (ref 79–97)
MONOCYTES ABSOLUTE COUNT: 0.3 10*3/uL (ref 0.1–0.9)
MONOCYTES RELATIVE PERCENT: 6 %
NEUTROPHILS ABSOLUTE COUNT: 3.9 10*3/uL (ref 1.4–7.0)
NEUTROPHILS RELATIVE PERCENT: 72 %
PLATELET COUNT: 136 10*3/uL — ABNORMAL LOW (ref 150–450)
RED BLOOD CELL COUNT: 4.13 x10E6/uL (ref 3.77–5.28)
RED CELL DISTRIBUTION WIDTH: 14.1 % (ref 11.7–15.4)
WHITE BLOOD CELL COUNT: 5.4 10*3/uL (ref 3.4–10.8)

## 2020-09-04 LAB — BASIC METABOLIC PANEL
BLOOD UREA NITROGEN: 20 mg/dL (ref 8–27)
BUN / CREAT RATIO: 12 (ref 12–28)
CALCIUM: 9.7 mg/dL (ref 8.7–10.3)
CHLORIDE: 97 mmol/L (ref 96–106)
CO2: 18 mmol/L — ABNORMAL LOW (ref 20–29)
CREATININE: 1.73 mg/dL — ABNORMAL HIGH (ref 0.57–1.00)
EGFR: 33 mL/min/{1.73_m2} — ABNORMAL LOW
GLUCOSE: 93 mg/dL (ref 65–99)
POTASSIUM: 4.7 mmol/L (ref 3.5–5.2)
SODIUM: 131 mmol/L — ABNORMAL LOW (ref 134–144)

## 2020-09-04 LAB — PHOSPHORUS: PHOSPHORUS, SERUM: 3.8 mg/dL (ref 3.0–4.3)

## 2020-09-04 LAB — MAGNESIUM: MAGNESIUM: 2.1 mg/dL (ref 1.6–2.3)

## 2020-09-04 MED FILL — SODIUM BICARBONATE 650 MG TABLET: ORAL | 30 days supply | Qty: 60 | Fill #5

## 2020-09-04 MED FILL — ENVARSUS XR 4 MG TABLET,EXTENDED RELEASE: ORAL | 30 days supply | Qty: 60 | Fill #14

## 2020-09-05 LAB — TACROLIMUS LEVEL: TACROLIMUS BLOOD: 9.6 ng/mL (ref 2.0–20.0)

## 2020-09-06 DIAGNOSIS — Z940 Kidney transplant status: Principal | ICD-10-CM

## 2020-09-06 DIAGNOSIS — N186 End stage renal disease: Principal | ICD-10-CM

## 2020-09-06 DIAGNOSIS — R7989 Other specified abnormal findings of blood chemistry: Principal | ICD-10-CM

## 2020-09-06 NOTE — Unmapped (Signed)
Called patient to check in and review recent labs. States she took her envarus at 9am the day before labs.  She will repeat labs on Wednesday in clinic.    Reports she has been doing well. No n/v/d, no recent falls, is eating and drinking. States she has not had her colonoscopy and is waiting for their call to make apt.    Went over apt times for next week. Denies any other needs

## 2020-09-10 DIAGNOSIS — Z940 Kidney transplant status: Principal | ICD-10-CM

## 2020-09-12 ENCOUNTER — Ambulatory Visit: Admit: 2020-09-12 | Discharge: 2020-09-12 | Payer: MEDICARE

## 2020-09-12 ENCOUNTER — Encounter: Admit: 2020-09-12 | Discharge: 2020-09-12 | Payer: MEDICARE

## 2020-09-12 ENCOUNTER — Ambulatory Visit: Admit: 2020-09-12 | Discharge: 2020-09-12 | Payer: MEDICARE | Attending: Nephrology | Primary: Nephrology

## 2020-09-12 DIAGNOSIS — R140 Abdominal distension (gaseous): Principal | ICD-10-CM

## 2020-09-12 DIAGNOSIS — R197 Diarrhea, unspecified: Principal | ICD-10-CM

## 2020-09-12 DIAGNOSIS — R112 Nausea with vomiting, unspecified: Principal | ICD-10-CM

## 2020-09-12 DIAGNOSIS — N186 End stage renal disease: Principal | ICD-10-CM

## 2020-09-12 DIAGNOSIS — R7989 Other specified abnormal findings of blood chemistry: Principal | ICD-10-CM

## 2020-09-12 DIAGNOSIS — Z940 Kidney transplant status: Principal | ICD-10-CM

## 2020-09-12 DIAGNOSIS — R918 Other nonspecific abnormal finding of lung field: Secondary | ICD-10-CM | POA: Diagnosis not present

## 2020-09-12 DIAGNOSIS — Z6822 Body mass index (BMI) 22.0-22.9, adult: Secondary | ICD-10-CM | POA: Diagnosis not present

## 2020-09-12 DIAGNOSIS — I080 Rheumatic disorders of both mitral and aortic valves: Secondary | ICD-10-CM | POA: Diagnosis not present

## 2020-09-12 DIAGNOSIS — I340 Nonrheumatic mitral (valve) insufficiency: Secondary | ICD-10-CM | POA: Diagnosis not present

## 2020-09-12 DIAGNOSIS — Z0184 Encounter for antibody response examination: Secondary | ICD-10-CM | POA: Diagnosis not present

## 2020-09-12 DIAGNOSIS — Z23 Encounter for immunization: Secondary | ICD-10-CM | POA: Diagnosis not present

## 2020-09-12 DIAGNOSIS — D696 Thrombocytopenia, unspecified: Secondary | ICD-10-CM | POA: Diagnosis not present

## 2020-09-12 DIAGNOSIS — I739 Peripheral vascular disease, unspecified: Secondary | ICD-10-CM | POA: Diagnosis not present

## 2020-09-12 DIAGNOSIS — I6523 Occlusion and stenosis of bilateral carotid arteries: Secondary | ICD-10-CM | POA: Diagnosis not present

## 2020-09-12 DIAGNOSIS — D849 Immunodeficiency, unspecified: Secondary | ICD-10-CM | POA: Diagnosis not present

## 2020-09-12 DIAGNOSIS — R911 Solitary pulmonary nodule: Secondary | ICD-10-CM | POA: Diagnosis not present

## 2020-09-12 DIAGNOSIS — R011 Cardiac murmur, unspecified: Secondary | ICD-10-CM | POA: Diagnosis not present

## 2020-09-12 LAB — IRON PANEL
IRON SATURATION: 20 %
IRON: 50 ug/dL
TOTAL IRON BINDING CAPACITY: 256 ug/dL (ref 250–425)

## 2020-09-12 LAB — HEPATIC FUNCTION PANEL
ALBUMIN: 3.6 g/dL (ref 3.4–5.0)
ALKALINE PHOSPHATASE: 146 U/L — ABNORMAL HIGH (ref 46–116)
ALT (SGPT): 8 U/L — ABNORMAL LOW (ref 10–49)
AST (SGOT): 9 U/L (ref <=34)
BILIRUBIN DIRECT: 0.2 mg/dL (ref 0.00–0.30)
BILIRUBIN TOTAL: 0.5 mg/dL (ref 0.3–1.2)
PROTEIN TOTAL: 6.6 g/dL (ref 5.7–8.2)

## 2020-09-12 LAB — LIPID PANEL
CHOLESTEROL/HDL RATIO SCREEN: 3.5 (ref 1.0–4.5)
CHOLESTEROL: 185 mg/dL (ref <=200)
HDL CHOLESTEROL: 53 mg/dL (ref 40–60)
LDL CHOLESTEROL CALCULATED: 106 mg/dL — ABNORMAL HIGH (ref 40–99)
NON-HDL CHOLESTEROL: 132 mg/dL — ABNORMAL HIGH (ref 70–130)
TRIGLYCERIDES: 129 mg/dL (ref 0–150)
VLDL CHOLESTEROL CAL: 25.8 mg/dL (ref 11–41)

## 2020-09-12 LAB — CBC W/ AUTO DIFF
BASOPHILS ABSOLUTE COUNT: 0.1 10*9/L (ref 0.0–0.1)
BASOPHILS RELATIVE PERCENT: 1.5 %
EOSINOPHILS ABSOLUTE COUNT: 0.2 10*9/L (ref 0.0–0.5)
EOSINOPHILS RELATIVE PERCENT: 3.8 %
HEMATOCRIT: 40.1 % (ref 34.0–44.0)
HEMOGLOBIN: 13.7 g/dL (ref 11.3–14.9)
LYMPHOCYTES ABSOLUTE COUNT: 0.9 10*9/L — ABNORMAL LOW (ref 1.1–3.6)
LYMPHOCYTES RELATIVE PERCENT: 14.2 %
MEAN CORPUSCULAR HEMOGLOBIN CONC: 34.3 g/dL (ref 32.0–36.0)
MEAN CORPUSCULAR HEMOGLOBIN: 32.5 pg — ABNORMAL HIGH (ref 25.9–32.4)
MEAN CORPUSCULAR VOLUME: 95 fL (ref 77.6–95.7)
MEAN PLATELET VOLUME: 10 fL (ref 6.8–10.7)
MONOCYTES ABSOLUTE COUNT: 0.2 10*9/L — ABNORMAL LOW (ref 0.3–0.8)
MONOCYTES RELATIVE PERCENT: 3.5 %
NEUTROPHILS ABSOLUTE COUNT: 4.7 10*9/L (ref 1.8–7.8)
NEUTROPHILS RELATIVE PERCENT: 77 %
PLATELET COUNT: 135 10*9/L — ABNORMAL LOW (ref 150–450)
RED BLOOD CELL COUNT: 4.22 10*12/L (ref 3.95–5.13)
RED CELL DISTRIBUTION WIDTH: 15.4 % — ABNORMAL HIGH (ref 12.2–15.2)
WBC ADJUSTED: 6.1 10*9/L (ref 3.6–11.2)

## 2020-09-12 LAB — COVID SPIKE IGG
SARS-COV-2 IGG SEMI-QUANT: <1.85 [AU]/ml (ref <13.00)
SARS-COV-2 SPIKE IGG ANTIBODY: NEGATIVE

## 2020-09-12 LAB — URINALYSIS
BILIRUBIN UA: NEGATIVE
BLOOD UA: NEGATIVE
GLUCOSE UA: NEGATIVE
KETONES UA: NEGATIVE
LEUKOCYTE ESTERASE UA: NEGATIVE
NITRITE UA: NEGATIVE
PH UA: 6 (ref 5.0–9.0)
PROTEIN UA: NEGATIVE
RBC UA: <1 /HPF (ref <4)
SPECIFIC GRAVITY UA: <=1.005 (ref 1.005–1.030)
SQUAMOUS EPITHELIAL: 3 /HPF (ref 0–5)
UROBILINOGEN UA: 0.2
WBC UA: <1 /HPF (ref 0–5)

## 2020-09-12 LAB — BASIC METABOLIC PANEL
ANION GAP: 2 mmol/L — ABNORMAL LOW (ref 5–14)
BLOOD UREA NITROGEN: 23 mg/dL (ref 9–23)
BUN / CREAT RATIO: 15
CALCIUM: 10.3 mg/dL (ref 8.7–10.4)
CHLORIDE: 108 mmol/L — ABNORMAL HIGH (ref 98–107)
CO2: 23.9 mmol/L (ref 20.0–31.0)
CREATININE: 1.58 mg/dL — ABNORMAL HIGH
EGFR CKD-EPI AA FEMALE: 40 mL/min/{1.73_m2} — ABNORMAL LOW (ref >=60)
EGFR CKD-EPI NON-AA FEMALE: 35 mL/min/{1.73_m2} — ABNORMAL LOW (ref >=60)
GLUCOSE RANDOM: 95 mg/dL (ref 70–179)
POTASSIUM: 5.1 mmol/L (ref 3.5–5.1)
SODIUM: 134 mmol/L — ABNORMAL LOW (ref 135–145)

## 2020-09-12 LAB — PHOSPHORUS: PHOSPHORUS: 3.3 mg/dL (ref 2.4–5.1)

## 2020-09-12 LAB — MAGNESIUM: MAGNESIUM: 2.2 mg/dL (ref 1.6–2.6)

## 2020-09-12 LAB — PROTEIN / CREATININE RATIO, URINE
CREATININE, URINE: 32.2 mg/dL
PROTEIN URINE: <6 mg/dL

## 2020-09-12 LAB — FERRITIN: FERRITIN: 502.8 ng/mL — ABNORMAL HIGH

## 2020-09-12 LAB — HEMOGLOBIN A1C
ESTIMATED AVERAGE GLUCOSE: 97 mg/dL
HEMOGLOBIN A1C: 5 % (ref 4.8–5.6)

## 2020-09-12 MED ORDER — AMLODIPINE 5 MG TABLET
ORAL_TABLET | Freq: Every day | ORAL | 11 refills | 30 days | Status: CP
Start: 2020-09-12 — End: 2021-09-12

## 2020-09-12 NOTE — Unmapped (Signed)
Saw patient in clinic. Reports she is doing good, no recent falls     BP high in clinic at home 130-140/70's    Reports she is seeing ortho, right leg pain better. Cortisone shot to hip about a month ago. Tailbone is healed. Still using cane. Reports continued pain to lower back     Has not had colonoscopy, waiting on call. Diarrhea once a week for 2 days. Reports she has thrown up 3 times in the past few months. Will call PCP    BS avg 80-100, no highs, no lows. Managed by pcp    +HA in the morning. Reports global HA that are ongoing. States the HA used to be in the in back side and now pain is all over. Described as throbbing    +balance problems while walking. No falls    +feels like she does not empty her bladder all the way    Drinking about 5 bottles of fluid per day    Denies CP, heart palpitations, dizziness, SOB, abdominal pain, swelling, or fevers    Labs today, took envarsus at 9am

## 2020-09-12 NOTE — Unmapped (Signed)
Transplant Nephrology Clinic Visit    Assessment/Plan:  Michele Cisneros is a 65 y.o. female s/p deceased donor kidney transplant on 03/30/2019 for native kidney ESRD presumed secondary to diabetic nephropathy. Active medical issues include:      S/P deceased donor kidney transplant with no history of rejection or DSAs.   - Serum creatinine is 1.58 with recent values in the range of 1.5-1.8 mg/dL. Notably, her baseline prior to 04/11/20 was  0.9-1.4 mg/dL.   - The modest rise in creatinine occurred in the setting of reduced PO intake related to GI symptoms and these symptoms are persistent.  - Tacrolimus levels have been recently higher as well (10.6 today).  - DSA screens have been negative (most recently 04/21/2009)   - UP/C is again normal, UA unremarkable  - BK viral load undetectable 09/12/20.   - In the absence of proteinuria or DSAs and with a possible explanation for the change in baseline creatinine related to volume depletion and higher tacrolimus levels, I do not feel a kidney biopsy is currently warranted.     Immunosuppression Management   - Tacrolimus level is 10.6 but is not a trough level. The trough level on 09/03/20 was 9.6 (target 4-7)    - Will reduce Envarsus XR from 8 mg daily to 7 mg daily.   - Myfortic was on hold starting 09/12/19 due to leukopenia and is now on hold due to GI symptoms.      History of post-transplant UTI:   - UA today not suggestive of UTI and she has no UTI symptoms  - Proteus isolated on 11/28/19, 12/26/19,02/27/20 and 03/19/20 with citrobacter also isolated 03/19/20  - S/P Augmentin x 14 days ending 03/16/20 with follow up culture 04/04/20, 04/30/20 and 05/21/20 revealing only mixed flora and 08/06/20 with lactobacillus  - Needs renal US of native and transplant kidneys to assure no stones in view of proteus isolates.    History of CMV viremia  - Viral load negative 04/11/20   - On no therapy currently.    Hypertension, with postural hypotension and syncope   - BP today is clinic 214/70 - Home BP has been 140's/70's  - Postural dizziness has improved and no further falls since stopping clonidine, amlodipine and carvedilol.   - The balance between symptomatic postural hypotension and recumbent or seated HTN remains challenging.   - Will restart amlodipine 5 mg daily today     Coronary artery disease, s/p RCA stent 2015, history of HFpEF secondary to diastolic dysfunction/mitral regurgitation (moderate to severe)  - no symptoms of cardiac ischemia recently   - Atorvastatin dose has been limited to 20 mg daily due to myopathy.  - Continue aspirin.   - DOE may be due to valvular heart disease.   - Echocardiogram today with moderate MR, mild to moderate aortic regurgitation and EF 60-65%.    - She will follow up with Cardiology    Esophageal stenosis, history of esophageal ulcers:   - She continues to have post-prandial discomfort and poor appetite.    - S/P dilation 07/06/19 with resolution of ulcers on EGD that date compared to 05/04/19 EGD  - She remains on Nexium  - She needs repeat EGD on same date as planned colonoscopy.  - Will also plan hydrogen breath test.     Pulmonary Nodules with History of Smoking Associated COPD:   - Chest CT today reveals stable pulmonary nodules compared to 05/02/19  - Will continue annual lung cancer screening low dose  CT    Lumbar Radiculopathy Secondary to L5-S1 disc disease Peripheral Neuropathy secondary to DM   - Following with [facility]  - Continue Lyrica.    Thyroid Nodule:   - [place] 02/01/20 revealed no nodules suggestive of cancer     History of DM, type II, controlled without current therapy:  - A1C today is 5.0.   - Metformin was stopped due to intermittent diarrhea and excellent control    Mild Thrombocytopenia  - Platelet count 135,000 today consistent with recent values  - No treatment necessary  - SPEP, immunoglobulin free light chains to be checked at next [name] lab draw.    Health Maintenance:   Mammogram: 03/12/20 in [place]. Chest CT today (09/12/20) again suggested asymmetry of breast tissue  Pap Smear: s/p hysterectomy  Colonoscopy: repeat study is now due to multiple tubular adenomas, will plan EGD at same time  Lung cancer screening: annual low dose chest CT   Renal US: due    Immunizations/Infection Prevention:   - Flu: 04/04/20  - Prevnar 13: not noted in records.    - Pneumovax: 03/23/18  - COVID-19: Patient has completed 3 doses of COVID-19 vaccine. Will give dose #4 today (09/12/20) with plan for repeat in 4 months. She is aware of limited efficacy of the vaccine.   - SARS-CoV-2 spike antibody is negative. Will plan Evusheld 2 weeks after today's COVID booster     Follow-Up: Return to clinic in 3 months. Patient needs EGD, colonoscopy, and hydrogen breath test. She needs Evusheld in 2 weeks.     Interval History:   She presents today with multiple complaints. She continues to have post-prandial abdominal discomfort despite esomeprazole. Diarrhea has improved now affecting her only approximately 2 days per week. She also has less nausea. Her weight is now 148 lbs (was 151 lbs 04/11/20). She notes intermittent urinary retention but denies dysuria, allograft tenderness, fever or chills. Since stopping her antihypertensives she has experienced less postural dizziness with home BP now averaging 140's/70s. She continues to have low back pain and hip pain. Her pain limits her mobility. She also continues to have peripheral neuropathy pain and lower extremity weakness and is still on Lyrica. Hand tremors are persistent. She also has morning headaches.      She denies fever or chills, cough, or shortness of breath. She has not had a UTI since October 2021.  Blood glucoses have been normal.      She has completed 3 doses of COVID-19 vaccine and had her flu vaccine. She continues to smoke.    Transplant History:    Native Kidney Disease: Diabetic nephropathy   Pre-transplant PRA: 70%   Organ Received: DDKT, DBD, KDPI: 70%  CMV/EBV Status: CMV D-/R+  EBV D+/R+  Resp culture: + strep pneumo  Date of Transplant: 03/30/2019  Implantation biopsy: mild ATN, otherwise normal.  Post-transplant complication - DGF  Induction: Campath with rapid steroid discontinuation  Maintenance Immunosuppression on discharge: Tacrolimus/Myfotic  Date of Ureteral Stent Removal: 06/13/2019  Current Immunosuppression: Tacrolimus monotherapy    Other Past Medical History  1. Diabetes mellitus, type 2, diagnosed 2012.   2. Diabetic peripheral neuropathy  3. Diabetic retinopathy, s/p laser surgery, s/p retinal detachment  4. Hypertension, diagnosed 2012.   5. Mixed hyperlipidemia.   6. Coronary artery disease, s/p NSTEMI 05/2013, PCI RCA, 20% KNm 80% PDA 1. Cardiac cath 05/02/15 LAD 20% and 30% lesions, Cx 35%, RPDA 70%, patent right coronary stent  7. HFpEF with EF 65-70% on  echo with moderate MR and mild to moderate AI on 09/12/20.  8. COPD, emphysema secondary to smoking  9. Peripheral artery disease  10. Carotid artery stenosis 1-39% bilateral stenosis 12/2017  10. Sickle Cell Trait  11. S/P TAH/BSO 2000  12. Lumbar radiculopathy secondary to L5-S1 disc bulge   13. History of thyroid nodule  14. Colonic polyps removed 08/10/17, pathology tubular adenomas,  follow up in 3 years  15. Right breast soft tissue mass noted 07/07/17 stable in appearance compared to past imaging  16. Pulmonary nodule noted 07/07/17 similar in size to lesion as far back as 2015.   17. OSA on CPAP    Review of Systems    Otherwise as per HPI, all other systems reviewed and are negative.    Medications  Current Outpatient Medications   Medication Sig Dispense Refill   ??? acetaminophen (TYLENOL) 500 MG tablet Take 1-2 tablets (500-1,000 mg total) by mouth every six (6) hours as needed for pain or fever (> 38C or 100.91F). 30 tablet 0   ??? albuterol (PROVENTIL HFA;VENTOLIN HFA) 90 mcg/actuation inhaler Inhale 2 puffs every six (6) hours as needed for wheezing.     ??? aspirin (ECOTRIN) 81 MG tablet Take 81 mg by mouth daily.     ??? atorvastatin (LIPITOR) 20 MG tablet Take 1 tablet (20 mg total) by mouth daily. 90 tablet 3   ??? blood sugar diagnostic (CONTOUR TEST STRIPS) Strp by Other route two (2) times a day. TEST BLOOD SUGARS 2 TIMES DAILY 60 strip 5   ??? blood-glucose meter (ONETOUCH ULTRA SYSTEM KIT) kit Please dispense for patient to use at home to check blood sugars.     ??? ergocalciferol-1,250 mcg, 50,000 unit, (VITAMIN D2-1,250 MCG, 50,000 UNIT,) 1,250 mcg (50,000 unit) capsule Take 1 capsule (1,250 mcg total) by mouth once a week. Take for 8 weeks as directed. 4 capsule 2   ??? esomeprazole (NEXIUM) 20 MG capsule Take 1 capsule (20 mg total) by mouth daily. 90 capsule 3   ??? LANCETS,THIN MISC Using at home to test blood sugars twice daily.     ??? magnesium oxide (MAG-OX) 400 mg (241.3 mg elemental magnesium) tablet Take 1 tablet (400 mg total) by mouth Two (2) times a day. 120 tablet 11   ??? nitroglycerin (NITROSTAT) 0.4 MG SL tablet Place 0.4 mg under the tongue daily as needed.     ??? pregabalin (LYRICA) 75 MG capsule Take 1 capsule (75 mg total) by mouth every morning AND 2 capsules (150 mg total) nightly. 90 capsule 3   ??? sodium bicarbonate 650 mg tablet Take 1 tablet (650 mg total) by mouth Two (2) times a day. 60 tablet 11   ??? tacrolimus (ENVARSUS XR) 4 mg Tb24 extended release tablet Take 2 tablets (8 mg total) by mouth every morning. 60 tablet 11   ??? carvediloL (COREG) 6.25 MG tablet Take 1 tablet (6.25 mg total) by mouth Two (2) times a day. (Patient not taking: Reported on 09/12/2020) 180 tablet 3   ??? melatonin 3 mg Tab Take 2 tablets (6 mg total) by mouth nightly as needed. (Patient not taking: Reported on 09/12/2020)  0   ??? mycophenolate (MYFORTIC) 180 MG EC tablet HOLD (Patient not taking: Reported on 02/15/2020) 120 tablet 11   ??? traMADoL (ULTRAM) 50 mg tablet Take 1 tablet (50 mg total) by mouth every eight (8) hours as needed for pain. (Patient not taking: Reported on 09/12/2020) 30 tablet 0     No  current facility-administered medications for this visit.           Physical Exam  BP 214/70 (BP Site: R Arm, BP Position: Sitting, BP Cuff Size: Large)  - Pulse 68  - Temp 35.9 ??C (96.6 ??F) (Temporal)  - Ht 171.5 cm (5' 7.52)  - Wt 67.3 kg (148 lb 6.4 oz)  - BMI 22.89 kg/m??   General: Patient is a pleasant female in no apparent distress.  Eyes: Sclera anicteric.  Neck: Supple without LAD/JVD/bruits.  Lungs: Clear to auscultation bilaterally, no wheezes/rales/rhonchi.  Cardiovascular: grade 2/6 systolic murmur.  Abdomen: Soft, notender/nondistended. Positive bowel sounds. No hepatosplenomegaly, masses or bruits appreciated.  Extremities: 1+ edema  Skin: Without rash  Neurological: ataxic gait, reduced strength in legs, symmetric.  Psychiatric: Mood and affect appropriate.    Laboratory Data and Imaging Reviewed

## 2020-09-12 NOTE — Unmapped (Signed)
AOBP:   arm   cuff     1st reading:210/69  67  2nd reading:216/71  68  3rd reading:215/71  68    Average:214/70  68    Manual:

## 2020-09-13 LAB — TACROLIMUS LEVEL, TROUGH: TACROLIMUS, TROUGH: 10.6 ng/mL (ref 5.0–15.0)

## 2020-09-13 LAB — CMV DNA, QUANTITATIVE, PCR: CMV VIRAL LD: NOT DETECTED

## 2020-09-13 LAB — VITAMIN D 25 HYDROXY: VITAMIN D, TOTAL (25OH): 38.9 ng/mL (ref 20.0–80.0)

## 2020-09-17 LAB — BK VIRUS QUANTITATIVE PCR, BLOOD: BK BLOOD RESULT: NOT DETECTED

## 2020-09-18 DIAGNOSIS — Z940 Kidney transplant status: Principal | ICD-10-CM

## 2020-09-18 LAB — VITAMIN D 1,25 DIHYDROXY: VITAMIN D 1,25-DIHYDROXY: 29 pg/mL

## 2020-09-18 MED ORDER — TACROLIMUS XR 1 MG TABLET,EXTENDED RELEASE 24 HR
ORAL_TABLET | Freq: Every day | ORAL | 11 refills | 30 days | Status: CP
Start: 2020-09-18 — End: 2021-09-18
  Filled 2020-10-05: qty 90, 30d supply, fill #0

## 2020-09-18 MED ORDER — TACROLIMUS XR 4 MG TABLET,EXTENDED RELEASE 24 HR
ORAL_TABLET | Freq: Every day | ORAL | 11 refills | 30.00000 days | Status: CP
Start: 2020-09-18 — End: ?

## 2020-09-18 NOTE — Unmapped (Signed)
Per Dr Margaretmary Bayley, decrease Envarsus to 7mg  daily and recheck labs on Monday.      Called and left patient voicemail to return call about dose change.

## 2020-09-18 NOTE — Unmapped (Signed)
Clinical Assessment Needed For: Dose Change  Medication: Envarsus XR 4mg  tablet  Last Fill Date/Day Supply: 09/04/2020 / 30 days  Refill Too Soon until 09/27/2020  Was previous dose already scheduled to fill: No    Notes to Pharmacist: Will re-test on 04/21    [facility] Specialty Medication Onboarding    Specialty Medication: Envarsus XR 1mg  tablet  Prior Authorization: Not Required   Financial Assistance: No - patient doesn't qualify for additional assistance   Final Copay/Day Supply: $114.04 / 30 days    Insurance Restrictions: None     Notes to Pharmacist: N/A    The triage team has completed the benefits investigation and has determined that the patient is able to fill this medication at [facility]. Please contact the patient to complete the onboarding or follow up with the prescribing physician as needed.

## 2020-09-18 NOTE — Unmapped (Signed)
Spoke with patient regarding Envarsus dose decrease from 8 mg to 7 mg starting tomorrow due to increase in Tacrolimus levels.  Patient will recheck labs on Monday. Patient verbalized understanding of dose change.

## 2020-09-20 LAB — HLA DS POST TRANSPLANT
ANTI-DONOR DRW #2 MFI: 0 MFI
ANTI-DONOR HLA-A #1 MFI: 133 MFI
ANTI-DONOR HLA-A #2 MFI: 393 MFI
ANTI-DONOR HLA-B #1 MFI: 509 MFI
ANTI-DONOR HLA-B #2 MFI: 278 MFI
ANTI-DONOR HLA-C #1 MFI: 146 MFI
ANTI-DONOR HLA-C #2 MFI: 467 MFI
ANTI-DONOR HLA-DQB #1 MFI: 0 MFI
ANTI-DONOR HLA-DQB #2 MFI: 0 MFI
ANTI-DONOR HLA-DR #1 MFI: 0 MFI
ANTI-DONOR HLA-DR #2 MFI: 0 MFI

## 2020-09-20 LAB — FSAB CLASS 2 ANTIBODY SPECIFICITY: HLA CL2 AB RESULT: NEGATIVE

## 2020-09-20 LAB — FSAB CLASS 1 ANTIBODY SPECIFICITY: HLA CLASS 1 ANTIBODY RESULT: POSITIVE

## 2020-09-24 DIAGNOSIS — Z940 Kidney transplant status: Principal | ICD-10-CM

## 2020-09-25 DIAGNOSIS — Z940 Kidney transplant status: Secondary | ICD-10-CM | POA: Diagnosis not present

## 2020-09-26 LAB — BASIC METABOLIC PANEL
BLOOD UREA NITROGEN: 16 mg/dL (ref 8–27)
BUN / CREAT RATIO: 11 — ABNORMAL LOW (ref 12–28)
CALCIUM: 10.1 mg/dL (ref 8.7–10.3)
CHLORIDE: 102 mmol/L (ref 96–106)
CO2: 20 mmol/L (ref 20–29)
CREATININE: 1.46 mg/dL — ABNORMAL HIGH (ref 0.57–1.00)
EGFR: 40 mL/min/{1.73_m2} — ABNORMAL LOW
GLUCOSE: 93 mg/dL (ref 65–99)
POTASSIUM: 5.6 mmol/L — ABNORMAL HIGH (ref 3.5–5.2)
SODIUM: 134 mmol/L (ref 134–144)

## 2020-09-26 LAB — CBC W/ DIFFERENTIAL
BANDED NEUTROPHILS ABSOLUTE COUNT: 0 10*3/uL (ref 0.0–0.1)
BASOPHILS ABSOLUTE COUNT: 0 10*3/uL (ref 0.0–0.2)
BASOPHILS RELATIVE PERCENT: 1 %
EOSINOPHILS ABSOLUTE COUNT: 0.2 10*3/uL (ref 0.0–0.4)
EOSINOPHILS RELATIVE PERCENT: 3 %
HEMATOCRIT: 40.2 % (ref 34.0–46.6)
HEMOGLOBIN: 13.3 g/dL (ref 11.1–15.9)
IMMATURE GRANULOCYTES: 0 %
LYMPHOCYTES ABSOLUTE COUNT: 0.7 10*3/uL (ref 0.7–3.1)
LYMPHOCYTES RELATIVE PERCENT: 14 %
MEAN CORPUSCULAR HEMOGLOBIN CONC: 33.1 g/dL (ref 31.5–35.7)
MEAN CORPUSCULAR HEMOGLOBIN: 32.1 pg (ref 26.6–33.0)
MEAN CORPUSCULAR VOLUME: 97 fL (ref 79–97)
MONOCYTES ABSOLUTE COUNT: 0.3 10*3/uL (ref 0.1–0.9)
MONOCYTES RELATIVE PERCENT: 6 %
NEUTROPHILS ABSOLUTE COUNT: 4 10*3/uL (ref 1.4–7.0)
NEUTROPHILS RELATIVE PERCENT: 76 %
PLATELET COUNT: 136 10*3/uL — ABNORMAL LOW (ref 150–450)
RED BLOOD CELL COUNT: 4.14 x10E6/uL (ref 3.77–5.28)
RED CELL DISTRIBUTION WIDTH: 13.7 % (ref 11.7–15.4)
WHITE BLOOD CELL COUNT: 5.2 10*3/uL (ref 3.4–10.8)

## 2020-09-26 LAB — MAGNESIUM: MAGNESIUM: 2.1 mg/dL (ref 1.6–2.3)

## 2020-09-26 LAB — PHOSPHORUS: PHOSPHORUS, SERUM: 3.3 mg/dL (ref 3.0–4.3)

## 2020-09-27 LAB — TACROLIMUS LEVEL: TACROLIMUS BLOOD: 3.2 ng/mL (ref 2.0–20.0)

## 2020-09-27 NOTE — Unmapped (Signed)
[facility] Pharmacist has reviewed this new prescription. (envarsus)  Patient was counseled on this dosage change by Luis Abed- see epic note from 09/18/20.  Next refill call date adjusted if necessary.

## 2020-09-27 NOTE — Unmapped (Signed)
Therapy Update Follow Up: No issues - Copay = $151.99

## 2020-10-04 NOTE — Unmapped (Signed)
[facility] Clinical Assessment & Refill Coordination Note    Michele Cisneros, DOB: 1956/03/05  Phone: [phone] (home)     All above HIPAA information was verified with patient.     Was a Nurse, learning disability used for this call? No    Specialty Medication(s):   Transplant: Envarsus 1mg  and Envarsus 4mg      Current Outpatient Medications   Medication Sig Dispense Refill   ??? acetaminophen (TYLENOL) 500 MG tablet Take 1-2 tablets (500-1,000 mg total) by mouth every six (6) hours as needed for pain or fever (> 38C or 100.66F). 30 tablet 0   ??? albuterol (PROVENTIL HFA;VENTOLIN HFA) 90 mcg/actuation inhaler Inhale 2 puffs every six (6) hours as needed for wheezing.     ??? amLODIPine (NORVASC) 5 MG tablet Take 1 tablet (5 mg total) by mouth daily. 30 tablet 11   ??? aspirin (ECOTRIN) 81 MG tablet Take 81 mg by mouth daily.     ??? atorvastatin (LIPITOR) 20 MG tablet Take 1 tablet (20 mg total) by mouth daily. 90 tablet 3   ??? blood sugar diagnostic (CONTOUR TEST STRIPS) Strp by Other route two (2) times a day. TEST BLOOD SUGARS 2 TIMES DAILY 60 strip 5   ??? blood-glucose meter (ONETOUCH ULTRA SYSTEM KIT) kit Please dispense for patient to use at home to check blood sugars.     ??? carvediloL (COREG) 6.25 MG tablet Take 1 tablet (6.25 mg total) by mouth Two (2) times a day. (Patient not taking: Reported on 09/12/2020) 180 tablet 3   ??? ergocalciferol-1,250 mcg, 50,000 unit, (VITAMIN D2-1,250 MCG, 50,000 UNIT,) 1,250 mcg (50,000 unit) capsule Take 1 capsule (1,250 mcg total) by mouth once a week. Take for 8 weeks as directed. 4 capsule 2   ??? esomeprazole (NEXIUM) 20 MG capsule Take 1 capsule (20 mg total) by mouth daily. 90 capsule 3   ??? LANCETS,THIN MISC Using at home to test blood sugars twice daily.     ??? magnesium oxide (MAG-OX) 400 mg (241.3 mg elemental magnesium) tablet Take 1 tablet (400 mg total) by mouth Two (2) times a day. 120 tablet 11   ??? melatonin 3 mg Tab Take 2 tablets (6 mg total) by mouth nightly as needed. (Patient not taking: Reported on 09/12/2020)  0   ??? mycophenolate (MYFORTIC) 180 MG EC tablet HOLD (Patient not taking: Reported on 02/15/2020) 120 tablet 11   ??? nitroglycerin (NITROSTAT) 0.4 MG SL tablet Place 0.4 mg under the tongue daily as needed.     ??? pregabalin (LYRICA) 75 MG capsule Take 1 capsule (75 mg total) by mouth every morning AND 2 capsules (150 mg total) nightly. 90 capsule 3   ??? sodium bicarbonate 650 mg tablet Take 1 tablet (650 mg total) by mouth Two (2) times a day. 60 tablet 11   ??? tacrolimus (ENVARSUS XR) 1 mg Tb24 extended release tablet Take three 1 mg tablets with one 4 mg tablet by mouth daily to equal 7 mg daily dose 90 tablet 11   ??? tacrolimus (ENVARSUS XR) 4 mg Tb24 extended release tablet Take one 4 mg tablet every morning with three 1 mg tablets to equal 7 mg daily dose 30 tablet 11   ??? traMADoL (ULTRAM) 50 mg tablet Take 1 tablet (50 mg total) by mouth every eight (8) hours as needed for pain. (Patient not taking: Reported on 09/12/2020) 30 tablet 0     No current facility-administered medications for this visit.  Changes to medications: Katrinia reports no changes at this time.    Allergies   Allergen Reactions   ??? Hydrocodone Hives   ??? Lisinopril      Unknown reaction    ??? Perfume        Changes to allergies: No    SPECIALTY MEDICATION ADHERENCE     Envarsus Xr 4 mg: 14 days of medicine on hand   Envarsus Xr 1 mg: 0 days of medicine on hand     Medication Adherence    Patient reported X missed doses in the last month: 0  Specialty Medication: Envarsus Xr 4mg   Patient is on additional specialty medications: Yes  Additional Specialty Medications: Envarsus Xr 1mg   Patient Reported Additional Medication X Missed Doses in the Last Month: 0  Patient is on more than two specialty medications: No          Specialty medication(s) dose(s) confirmed: Regimen is correct and unchanged.     Are there any concerns with adherence? No    Adherence counseling provided? Not needed    CLINICAL MANAGEMENT AND INTERVENTION      Clinical Benefit Assessment:    Do you feel the medicine is effective or helping your condition? Yes    Clinical Benefit counseling provided? Not needed    Adverse Effects Assessment:    Are you experiencing any side effects? No    Are you experiencing difficulty administering your medicine? No    Quality of Life Assessment:    How many days over the past month did your kidney transplant  keep you from your normal activities? For example, brushing your teeth or getting up in the morning. 0    Have you discussed this with your provider? Not needed    Acute Infection Status:    Acute infections noted within Epic:  No active infections  Patient reported infection: None    Therapy Appropriateness:    Is therapy appropriate? Yes, therapy is appropriate and should be continued    DISEASE/MEDICATION-SPECIFIC INFORMATION      N/A    PATIENT SPECIFIC NEEDS     - Does the patient have any physical, cognitive, or cultural barriers? No    - Is the patient high risk? No    - Does the patient require a Care Management Plan? No     - Does the patient require physician intervention or other additional services (i.e. nutrition, smoking cessation, social work)? No      SHIPPING     Specialty Medication(s) to be Shipped:   Transplant: Envarsus 1mg   Patient declined Envarsus 4mg  today    Other medication(s) to be shipped: atorvastatin, Vit D, esomeprazole, Mg, pregablin, sod bicarb     Changes to insurance: No    Delivery Scheduled: Yes, Expected medication delivery date: 10/05/20.     Medication will be delivered via Same Day Courier to the confirmed prescription address [facility].    The patient will receive a drug information handout for each medication shipped and additional FDA Medication Guides as required.  Verified that patient has previously received a Conservation officer, historic buildings and a Surveyor, mining.    All of the patient's questions and concerns have been addressed.    Tera Helper   [facility] Specialty Pharmacist

## 2020-10-05 MED FILL — SODIUM BICARBONATE 650 MG TABLET: ORAL | 30 days supply | Qty: 60 | Fill #6

## 2020-10-05 MED FILL — MAGNESIUM OXIDE 400 MG (241.3 MG MAGNESIUM) TABLET: ORAL | 60 days supply | Qty: 120 | Fill #1

## 2020-10-05 MED FILL — ERGOCALCIFEROL (VITAMIN D2) 1,250 MCG (50,000 UNIT) CAPSULE: ORAL | 28 days supply | Qty: 4 | Fill #1

## 2020-10-05 MED FILL — ATORVASTATIN 20 MG TABLET: ORAL | 90 days supply | Qty: 90 | Fill #2

## 2020-10-05 MED FILL — ESOMEPRAZOLE MAGNESIUM 20 MG CAPSULE,DELAYED RELEASE: ORAL | 90 days supply | Qty: 90 | Fill #2

## 2020-10-05 MED FILL — PREGABALIN 75 MG CAPSULE: ORAL | 30 days supply | Qty: 90 | Fill #1

## 2020-10-08 DIAGNOSIS — Z940 Kidney transplant status: Principal | ICD-10-CM

## 2020-10-15 NOTE — Unmapped (Signed)
[facility] Refill Coordination Note    Specialty Medication(s) to be Shipped:   Transplant: Envarsus 4mg     Other medication(s) to be shipped: No additional medications requested for fill at this time     Michele Cisneros, DOB: 19-Dec-1955  Phone: [phone] (home)       All above HIPAA information was verified with patient.     Was a Nurse, learning disability used for this call? No    Completed refill call assessment today to schedule patient's medication shipment from the [facility] [phone]).  All relevant notes have been reviewed.     Specialty medication(s) and dose(s) confirmed: Regimen is correct and unchanged.   Changes to medications: [name] reports no changes at this time.  Changes to insurance: No  New side effects reported not previously addressed with a pharmacist or physician: None reported  Questions for the pharmacist: No    Confirmed patient received a Conservation officer, historic buildings and a Surveyor, mining with first shipment. The patient will receive a drug information handout for each medication shipped and additional FDA Medication Guides as required.       DISEASE/MEDICATION-SPECIFIC INFORMATION        N/A    SPECIALTY MEDICATION ADHERENCE     Medication Adherence    Patient reported X missed doses in the last month: 0  Specialty Medication: tacrolimus (ENVARSUS XR) 4 mg Tb24 extended release tablet  Patient is on additional specialty medications: No              Were doses missed due to medication being on hold? No    Envarsus 4mg : 7 days worth of medication on hand.      REFERRAL TO PHARMACIST     Referral to the pharmacist: Not needed      [facility]     Shipping address confirmed in Epic.     Delivery Scheduled: Yes, Expected medication delivery date: 10/17/20.     Medication will be delivered via UPS to the prescription address in Epic W[facility].    [place] A Orrie Lascano   [facility] Specialty Technician

## 2020-10-16 MED FILL — ENVARSUS XR 4 MG TABLET,EXTENDED RELEASE: ORAL | 30 days supply | Qty: 30 | Fill #0

## 2020-10-22 DIAGNOSIS — Z940 Kidney transplant status: Principal | ICD-10-CM

## 2020-10-23 DIAGNOSIS — Z940 Kidney transplant status: Secondary | ICD-10-CM | POA: Diagnosis not present

## 2020-10-23 LAB — CBC W/ DIFFERENTIAL
BANDED NEUTROPHILS ABSOLUTE COUNT: 0 10*3/uL (ref 0.0–0.1)
BASOPHILS ABSOLUTE COUNT: 0 10*3/uL (ref 0.0–0.2)
BASOPHILS RELATIVE PERCENT: 0 %
EOSINOPHILS ABSOLUTE COUNT: 0.2 10*3/uL (ref 0.0–0.4)
EOSINOPHILS RELATIVE PERCENT: 3 %
HEMATOCRIT: 36.1 % (ref 34.0–46.6)
HEMOGLOBIN: 11.8 g/dL (ref 11.1–15.9)
IMMATURE GRANULOCYTES: 0 %
LYMPHOCYTES ABSOLUTE COUNT: 1 10*3/uL (ref 0.7–3.1)
LYMPHOCYTES RELATIVE PERCENT: 18 %
MEAN CORPUSCULAR HEMOGLOBIN CONC: 32.7 g/dL (ref 31.5–35.7)
MEAN CORPUSCULAR HEMOGLOBIN: 31.3 pg (ref 26.6–33.0)
MEAN CORPUSCULAR VOLUME: 96 fL (ref 79–97)
MONOCYTES ABSOLUTE COUNT: 0.3 10*3/uL (ref 0.1–0.9)
MONOCYTES RELATIVE PERCENT: 6 %
NEUTROPHILS ABSOLUTE COUNT: 4 10*3/uL (ref 1.4–7.0)
NEUTROPHILS RELATIVE PERCENT: 73 %
PLATELET COUNT: 135 10*3/uL — ABNORMAL LOW (ref 150–450)
RED BLOOD CELL COUNT: 3.77 x10E6/uL (ref 3.77–5.28)
RED CELL DISTRIBUTION WIDTH: 13.5 % (ref 11.7–15.4)
WHITE BLOOD CELL COUNT: 5.5 10*3/uL (ref 3.4–10.8)

## 2020-10-24 LAB — MAGNESIUM: MAGNESIUM: 2 mg/dL (ref 1.6–2.3)

## 2020-10-24 LAB — PHOSPHORUS: PHOSPHORUS, SERUM: 3.9 mg/dL (ref 3.0–4.3)

## 2020-10-25 LAB — BASIC METABOLIC PANEL
BLOOD UREA NITROGEN: 19 mg/dL (ref 8–27)
BUN / CREAT RATIO: 14 (ref 12–28)
CALCIUM: 9.7 mg/dL (ref 8.7–10.3)
CHLORIDE: 106 mmol/L (ref 96–106)
CO2: 19 mmol/L — ABNORMAL LOW (ref 20–29)
CREATININE: 1.35 mg/dL — ABNORMAL HIGH (ref 0.57–1.00)
EGFR: 44 mL/min/{1.73_m2} — ABNORMAL LOW
GLUCOSE: 80 mg/dL (ref 65–99)
POTASSIUM: 5.1 mmol/L (ref 3.5–5.2)
SODIUM: 137 mmol/L (ref 134–144)

## 2020-10-26 LAB — TACROLIMUS LEVEL: TACROLIMUS BLOOD: 8.9 ng/mL (ref 2.0–20.0)

## 2020-10-28 NOTE — Progress Notes (Deleted)
Cardiology Office Note  Date:  10/28/2020   ID:  Clarita, Mcelvain 07/30/1955, MRN [id number]  PCP:  McLean-Scocuzza, Nino Glow, MD   No chief complaint on file.   HPI:  Ms. Gathers is a pleasant 65 -year old woman with  CAD,  Drug-eluting stent placed December 2014 to the mid RCA Repeat cath 2017 for positive stress test at [facility], medical management 25 years of smoking (quit in 2013-2014),  diabetes,  chronic renal insufficiency,   moderate to severe mitral valve regurgitation on prior echocardiogram (improved to moderate MR on recent echo), pneumonia in June of 2012 with admission overnight to [facility] ESRD on dialysis since February 2014, 3 days per week. 59% bilateral carotid arterial disease as of 2017  Positive stress test  2016-04-14 at [facility] Renal transplant She presents today for follow-up of her coronary artery disease   Last seen in clinic by myself September 2018 Most recently seen by one of our providers May 2021  Review of recent records details Myoview January 2019 at [facility] no ischemia Echocardiogram February 2019 [facility] moderate MR, normal ejection fraction Tolerating hemodialysis Monday Wednesday Friday Echocardiogram July 2020 normal ejection fraction [facility] 04-15-2019 deceased donor renal transplant  Recently in the hospital November 2021 Records reviewed in detail with her Hypertensive urgency, flash pulmonary edema, mitral valve regurgitation She had stopped several of her blood pressure medications, these were restarted with improved blood pressure Now on amlodipine carvedilol clonidine Sublingual nitro for any spikes in pressure  Feels well on today's visit No chest pain or SOB Denies leg swelling Previous office notes reported high fluid intake on weekends   Other past medical history reviewed Cardic catheterization 2017 showed 70% disease ostial right PDA, unchanged from 2014 cardiac catheterization otherwise other regions of 20-30% disease, nonobstructive  PMH:    has a past medical history of Anemia of chronic disease, Anginal pain ([place]), Carotid arterial disease ([place]), Chronic constipation, Chronic diastolic CHF (congestive heart failure) ([place]), Colon polyps, COPD (chronic obstructive pulmonary disease) ([place]), Coronary artery disease, Diabetes mellitus, Diabetic neuropathy ([place]), Diabetic retinopathy ([place]) (05/28/2013), Emphysema, ESRD on hemodialysis ([place]), History of bronchitis, History of pneumonia, History of tobacco abuse, Hyperlipidemia, Hypertension, Moderate to severe mitral insufficiency, Myocardial infarct ([place]) (05/2013), Peripheral vascular disease ([place]), Sickle cell trait ([place]), and Thyroid nodule.  PSH:    Past Surgical History:  Procedure Laterality Date  . A/V FISTULAGRAM Left 11/10/2017   Procedure: A/V FISTULAGRAM;  Surgeon: Katha Cabal, MD;  Location: [place] [facility];  Service: Cardiovascular;  Laterality: Left;  . A/V SHUNTOGRAM Left 02/08/2019   Procedure: A/V SHUNTOGRAM;  Surgeon: Katha Cabal, MD;  Location: [place] [facility];  Service: Cardiovascular;  Laterality: Left;  . ABDOMINAL HYSTERECTOMY     2000  . CARDIAC CATHETERIZATION    . CARDIAC CATHETERIZATION N/A 05/02/2015   Procedure: Left Heart Cath and Coronary Angiography;  Surgeon: [name], MD;  Location: [place] [facility];  Service: Cardiovascular;  Laterality: N/A;  . COLONOSCOPY WITH PROPOFOL N/A 07/08/2016   Procedure: COLONOSCOPY WITH PROPOFOL;  Surgeon: [name], MD;  Location: [facility] ENDOSCOPY;  Service: Endoscopy;  Laterality: N/A;  . COLONOSCOPY WITH PROPOFOL N/A 08/20/2017   Procedure: COLONOSCOPY WITH PROPOFOL;  Surgeon: [name], MD;  Location: [facility] ENDOSCOPY;  Service: Gastroenterology;  Laterality: N/A;  . colonscopy    . CORONARY ANGIOPLASTY  05/28/2014   stent placement to the mid RCA  . [facility] with stent  placement 11 or 05/2019 and stent removal 06/13/19   . DILATION AND CURETTAGE OF UTERUS      several in the early 80's  . ESOPHAGOGASTRODUODENOSCOPY     2012  . ESOPHAGOGASTRODUODENOSCOPY (EGD) WITH PROPOFOL N/A 03/11/2018   Procedure: ESOPHAGOGASTRODUODENOSCOPY (EGD) WITH PROPOFOL;  Surgeon: [name], MD;  Location: [facility] ENDOSCOPY;  Service: Gastroenterology;  Laterality: N/A;  . EYE SURGERY     bilateral laser 2012  . EYE SURGERY     right  . EYE SURGERY     x4 both eyes  . GAS INSERTION  09/30/2011   Procedure: INSERTION OF GAS;  Surgeon: [name], MD;  Location: [place];  Service: Ophthalmology;  Laterality: Right;  [id number]  . GAS/FLUID EXCHANGE  09/30/2011   Procedure: GAS/FLUID EXCHANGE;  Surgeon: [name], MD;  Location: [place];  Service: Ophthalmology;  Laterality: Right;  . KIDNEY TRANSPLANT     03/30/19 Dr. Mickel Baas Thomas/Dr. Cristie Hem Zendel/Dr. Gilford Raid; [facility] transplant Dr. Horald Chestnut will be primary [phone] coordinator Sarah Wynkoop [phone]  . KIDNEY TRANSPLANT     03/30/19 [facility]  . LEFT HEART CATHETERIZATION WITH CORONARY ANGIOGRAM N/A 05/28/2013   Procedure: LEFT HEART CATHETERIZATION WITH CORONARY ANGIOGRAM;  Surgeon: [name], MD;  Location: [facility] CATH LAB;  Service: Cardiovascular;  Laterality: N/A;  . PARS PLANA VITRECTOMY  04/22/2011   Procedure: PARS PLANA VITRECTOMY WITH 25 GAUGE;  Surgeon: [name], MD;  Location: [place];  Service: Ophthalmology;  Laterality: Left;  membrane peel, endolaser, gas fluid exchange, silicone oil, repair of complex traction retinal detachment  . PARS PLANA VITRECTOMY  09/30/2011   Procedure: PARS PLANA VITRECTOMY WITH 25 GAUGE;  Surgeon: [name], MD;  Location: [place];  Service: Ophthalmology;  Laterality: Right;  Endolaser; Repair of Complex Traction Retinal Detachment  . PARS PLANA VITRECTOMY  02/24/2012   Procedure: PARS PLANA VITRECTOMY WITH 25 GAUGE;  Surgeon: [name], MD;  Location: [place];  Service: Ophthalmology;  Laterality: Left;  . PTCA    . SILICON OIL REMOVAL  9/37/1696    Procedure: SILICON OIL REMOVAL;  Surgeon: [name], MD;  Location: [place];  Service: Ophthalmology;  Laterality: Left;  . THROMBECTOMY / ARTERIOVENOUS GRAFT REVISION    . TUBAL LIGATION     1979  . UPPER EXTREMITY ANGIOGRAPHY Left 02/08/2019   Procedure: UPPER EXTREMITY ANGIOGRAPHY;  Surgeon: Katha Cabal, MD;  Location: [place] [facility];  Service: Cardiovascular;  Laterality: Left;    Current Outpatient Medications  Medication Sig Dispense Refill  . acetaminophen (TYLENOL) 500 MG tablet Take 1,000 mg by mouth 2 (two) times daily.    Marland Kitchen albuterol (PROVENTIL) (2.5 MG/3ML) 0.083% nebulizer solution Take 3 mLs (2.5 mg total) by nebulization every 6 (six) hours as needed for wheezing or shortness of breath. 75 mL 11  . albuterol (VENTOLIN HFA) 108 (90 Base) MCG/ACT inhaler Inhale 1-2 puffs into the lungs every 6 (six) hours as needed for wheezing or shortness of breath. 1 Inhaler 12  . amLODipine (NORVASC) 10 MG tablet Take 1 tablet (10 mg total) by mouth daily. 30 tablet 1  . aspirin EC 81 MG tablet Take 1 tablet (81 mg total) by mouth at bedtime. 30 tablet 11  . atorvastatin (LIPITOR) 20 MG tablet Take 20 mg by mouth daily.    . carvedilol (COREG) 12.5 MG tablet Take 1 tablet (12.5 mg total) by mouth 2 (two) times daily with  a meal. 180 tablet 1  . cetirizine (ZYRTEC) 5 MG tablet Take 1 tablet (5 mg total) by mouth daily as needed for allergies. 90 tablet 3  . cloNIDine (CATAPRES) 0.1 MG tablet Take 1 tablet (0.1 mg total) by mouth 2 (two) times daily. 60 tablet 1  . esomeprazole (NEXIUM) 20 MG capsule Take 20 mg by mouth daily.    . furosemide (LASIX) 20 MG tablet Take 1 tablet (20 mg total) by mouth daily as needed (shortness of breath). 30 tablet 0  . nitroGLYCERIN (NITROSTAT) 0.4 MG SL tablet Place 1 tablet (0.4 mg total) under the tongue every 5 (five) minutes as needed for chest pain. 25 tablet 3  . pregabalin (LYRICA) 75 MG capsule Take 1 capsule (75 mg total) by mouth 2 (two)  times daily.    . pregabalin (LYRICA) 75 MG capsule Take 1 capsule (75mg ) in the AM and 2 capsules (150mg ) in the PM    . sodium zirconium cyclosilicate (LOKELMA) 10 g PACK packet Take 10 g by mouth daily. 30 packet 1  . Tacrolimus ER 4 MG TB24 Take 8 mg by mouth every morning.     Marland Kitchen UNABLE TO FIND TEST BLOOD SUGARS 2 TIMES DAILY     No current facility-administered medications for this visit.     Allergies:   Hydrocodone and Lisinopril   Social History:  The patient  reports that she quit smoking about 10 years ago. She has a 25.00 pack-year smoking history. She has never used smokeless tobacco. She reports that she does not drink alcohol and does not use drugs.   Family History:   family history includes Breast cancer in her sister; Colon cancer in her father and sister; Diabetes in her brother, brother, and daughter; Heart attack in her brother and mother; Heart disease in her mother; Renal Disease in her daughter; Stroke in her brother and mother.    Review of Systems: Review of Systems  Constitutional: Negative.   HENT: Negative.   Respiratory: Negative.   Cardiovascular: Negative.   Gastrointestinal: Negative.   Musculoskeletal: Negative.   Neurological: Negative.   Psychiatric/Behavioral: Negative.   All other systems reviewed and are negative.   PHYSICAL EXAM: VS:  There were no vitals taken for this visit. , BMI There is no height or weight on file to calculate BMI. Repeat blood pressure 170/70 GEN: Well nourished, well developed, in no acute distress  HEENT: normal  Neck: no JVD, carotid bruits, or masses Cardiac: RRR;2+ SEM LSB,rubs, or gallops,no edema  Respiratory:  clear to auscultation bilaterally, normal work of breathing GI: soft, nontender, nondistended, + BS MS: no deformity or atrophy  Skin: warm and dry, no rash Neuro:  Strength and sensation are intact Psych: euthymic mood, full affect  Recent Labs: 04/18/2020: ALT 9; B Natriuretic Peptide  657.1 04/20/2020: Hemoglobin 8.5; Platelets 174 04/21/2020: BUN 23; Creatinine, Ser 1.39; Potassium 4.6; Sodium 134    Lipid Panel Lab Results  Component Value Date   CHOL 161 05/22/2016   HDL 59 05/22/2016   LDLCALC 89 05/22/2016   TRIG 65 05/22/2016      Wt Readings from Last 3 Encounters:  05/29/20 149 lb 2 oz (67.6 kg)  04/27/20 150 lb (68 kg)  04/21/20 152 lb 5.4 oz (69.1 kg)     ASSESSMENT AND PLAN:  Essential (primary) hypertension -  Recommend she stay on her current regiment For spikes in pressure she may need sublingual nitro  Coronary artery disease involving native coronary artery  of native heart without angina pectoris - Plan: EKG 12-Lead Currently with no symptoms of angina. No further workup at this time. Continue current medication regimen.  Mitral valve insufficiency, unspecified etiology - Previous echocardiogram September 2017 with moderate to severe MR Likely dynamic MR, will change with fluid status from hemodialysis Moderate on recent echo  ESRD, on hemodialysis ([place]) Off HD Had transplant Stable  Diabetic polyneuropathy associated with type 2 diabetes mellitus (HCC) HBA1C 5.0 Off medications  S/P coronary artery stent placement Denies anginal symptoms  Carotid stenosis 40-59% bilateral disease Quit smoking, 2014 Periodic carotid u/s   Total encounter time more than 25 minutes  Greater than 50% was spent in counseling and coordination of care with the patient   No orders of the defined types were placed in this encounter.    Signed, Esmond Plants, M.D., Ph.D. 10/28/2020  [facility] [place], [place] [phone]

## 2020-10-29 ENCOUNTER — Ambulatory Visit: Payer: Medicare Other | Admitting: Cardiovascular Disease

## 2020-10-29 DIAGNOSIS — I351 Nonrheumatic aortic (valve) insufficiency: Secondary | ICD-10-CM

## 2020-10-29 DIAGNOSIS — I10 Essential (primary) hypertension: Secondary | ICD-10-CM

## 2020-10-29 DIAGNOSIS — I739 Peripheral vascular disease, unspecified: Secondary | ICD-10-CM

## 2020-10-29 DIAGNOSIS — N186 End stage renal disease: Secondary | ICD-10-CM

## 2020-10-29 DIAGNOSIS — I340 Nonrheumatic mitral (valve) insufficiency: Secondary | ICD-10-CM

## 2020-10-29 DIAGNOSIS — I25118 Atherosclerotic heart disease of native coronary artery with other forms of angina pectoris: Secondary | ICD-10-CM

## 2020-10-29 DIAGNOSIS — I5032 Chronic diastolic (congestive) heart failure: Secondary | ICD-10-CM

## 2020-10-30 NOTE — Unmapped (Signed)
[facility] Refill Coordination Note    Specialty Medication(s) to be Shipped:   Transplant: Envarsus 1mg     Other medication(s) to be shipped: vitamin D, pregabalin and sodium bicarb     Michele Cisneros, DOB: 04/29/1956  Phone: [phone] (home)       All above HIPAA information was verified with patient.     Was a Nurse, learning disability used for this call? No    Completed refill call assessment today to schedule patient's medication shipment from the [facility] [phone]).  All relevant notes have been reviewed.     Specialty medication(s) and dose(s) confirmed: Regimen is correct and unchanged.   Changes to medications: Michele Cisneros reports no changes at this time.  Changes to insurance: No  New side effects reported not previously addressed with a pharmacist or physician: None reported  Questions for the pharmacist: No    Confirmed patient received a Conservation officer, historic buildings and a Surveyor, mining with first shipment. The patient will receive a drug information handout for each medication shipped and additional FDA Medication Guides as required.       DISEASE/MEDICATION-SPECIFIC INFORMATION        N/A    SPECIALTY MEDICATION ADHERENCE     Medication Adherence    Patient reported X missed doses in the last month: 0  Specialty Medication: Envarsus 1mg   Patient is on additional specialty medications: No        Were doses missed due to medication being on hold? No    Envarsus 1 mg: 5 days of medicine on hand     REFERRAL TO PHARMACIST     Referral to the pharmacist: Not needed      [facility]     Shipping address confirmed in Epic.     Delivery Scheduled: Yes, Expected medication delivery date: 11/02/2020.     Medication will be delivered via Next Day Courier to the prescription address in Epic WAM.    [name]   [facility] Specialty Technician

## 2020-11-01 MED FILL — SODIUM BICARBONATE 650 MG TABLET: ORAL | 30 days supply | Qty: 60 | Fill #7

## 2020-11-01 MED FILL — ENVARSUS XR 1 MG TABLET,EXTENDED RELEASE: ORAL | 30 days supply | Qty: 90 | Fill #1

## 2020-11-01 MED FILL — PREGABALIN 75 MG CAPSULE: ORAL | 30 days supply | Qty: 90 | Fill #2

## 2020-11-01 MED FILL — ERGOCALCIFEROL (VITAMIN D2) 1,250 MCG (50,000 UNIT) CAPSULE: ORAL | 28 days supply | Qty: 4 | Fill #2

## 2020-11-05 DIAGNOSIS — Z940 Kidney transplant status: Principal | ICD-10-CM

## 2020-11-08 ENCOUNTER — Ambulatory Visit: Payer: Medicare Other | Admitting: Podiatry

## 2020-11-08 NOTE — Progress Notes (Deleted)
Cardiology Office Note:    Date:  11/09/2020   ID:  Theresa [name], DOB 1956-05-03, MRN [id number]  PCP:  McLean-Scocuzza, Nino Glow, MD  [facility] Cardiologist:  Ida Rogue, MD  [facility] Electrophysiologist:  None   Referring MD: McLean-Scocuzza, Olivia Mackie *   Chief Complaint: 6 month  History of Present Illness:    Theresa [name] is a 65 y.o. female with a hx of CAD status post RCA stenting, moderate MR, HFpEF, ESRD presumed secondary to diabetic nephropathy previously on HD status post deceased donor renal transplant on 03/30/2019, COPD, PVD, carotid artery disease, diabetes type 2 with diabetic neuropathy and retinopathy, sickle cell trait, hypertension, hyperlipidemia, lumbar radiculopathy, thyroid nodule, esophageal stenosis with history of esophageal ulcer status post dilation, remote tobacco use who is being evaluated for 44-month follow-up.    Past Medical History:  Diagnosis Date  . Anemia of chronic disease   . Anginal pain ([place])   . Carotid arterial disease ([place])    a. 02/2013 U/S: 40-59% bilat ICA stenosis.  . Chronic constipation   . Chronic diastolic CHF (congestive heart failure) ([place])    a. 10/2013 Echo [facility]): EF 55-60%, mod conc LVH, mod MR, mildly dil LA, mild Ao sclerosis w/o stenosis; b. 02/2016 Echo: EF 55-60%, no rwma, Gr DD, mild AS, mod to sev MR, mildly dil LA, PASP 36mmHg; c. 07/2017 Echo [facility]): EF >55%, mild to mod LVH, Gr2 DD, Ao scl, Mod MR, mod dil LA, nl RV fxn.  . Colon polyps   . COPD (chronic obstructive pulmonary disease) ([place])   . Coronary artery disease    a. 05/2013 NSTEMI/PCI: LM 20d, LAD min irregs, LCX small, nl, OM1 nl, RCA dom 49m (2.5x16 Promus DES), PDA1 80p; b. 04/2015 Cath: LM nl, LAD 72m, D1/2 min irregs, LCX 35p/m, OM2/3 min irregs, RCA patent mid stent, RPDA 70ost, RPLB1 30, RPLB2/3 min irregs, EF 55-65%--> Med Rx; c. 06/2017 [facility]): No ischemia/infarct, EF 64%.  . Diabetes mellitus   . Diabetic neuropathy ([place])   . Diabetic  retinopathy ([place]) 05/28/2013   Hx bilat retinal detachment, proliferative diab retinopathy and bilat vitreous hemorrhage   . Emphysema   . ESRD on hemodialysis ([place])    a. [facility] in [place], [place] [phone]/Dr. [name], on a MWF schedule.  She started dialysis in Feb 2014.  Etiology of renal failure not known, likely diabetes.  Has a left upper arm AV graft.  . History of bronchitis    Mar 2012  . History of pneumonia    June 2012  . History of tobacco abuse    a. Quit 2012.  Marland Kitchen Hyperlipidemia   . Hypertension   . Moderate to severe mitral insufficiency    a. 10/2013 Echo: EF 55-60%, mod MR; b. 02/2016 Echo: EF 55-60%, mod to sev MR directed posteriorly--felt to be dynamic -worse with volume overload; c. 07/2017 Echo [facility]): EF >55%. Mod MR.  . Myocardial infarct ([place]) 05/2013  . Peripheral vascular disease ([place])   . Sickle cell trait ([place])   . Thyroid nodule    [place] 06/2017 due to f/u US in 1 year     Past Surgical History:  Procedure Laterality Date  . A/V FISTULAGRAM Left 11/10/2017   Procedure: A/V FISTULAGRAM;  Surgeon: Katha Cabal, MD;  Location: [place] CV LAB;  Service: Cardiovascular;  Laterality: Left;  . A/V SHUNTOGRAM Left 02/08/2019   Procedure: A/V SHUNTOGRAM;  Surgeon: Katha Cabal, MD;  Location: [facility] INVASIVE CV  LAB;  Service: Cardiovascular;  Laterality: Left;  . ABDOMINAL HYSTERECTOMY     2000  . CARDIAC CATHETERIZATION    . CARDIAC CATHETERIZATION N/A 05/02/2015   Procedure: Left Heart Cath and Coronary Angiography;  Surgeon: [name], MD;  Location: [place] CV LAB;  Service: Cardiovascular;  Laterality: N/A;  . COLONOSCOPY WITH PROPOFOL N/A 07/08/2016   Procedure: COLONOSCOPY WITH PROPOFOL;  Surgeon: [name], MD;  Location: [facility] ENDOSCOPY;  Service: Endoscopy;  Laterality: N/A;  . COLONOSCOPY WITH PROPOFOL N/A 08/20/2017   Procedure: COLONOSCOPY WITH PROPOFOL;  Surgeon: [name], MD;  Location: [facility] ENDOSCOPY;  Service:  Gastroenterology;  Laterality: N/A;  . colonscopy    . CORONARY ANGIOPLASTY  05/28/2014   stent placement to the mid RCA  . [facility] with stent placement 11 or 05/2019 and stent removal 06/13/19   . DILATION AND CURETTAGE OF UTERUS     several in the early 80's  . ESOPHAGOGASTRODUODENOSCOPY     2012  . ESOPHAGOGASTRODUODENOSCOPY (EGD) WITH PROPOFOL N/A 03/11/2018   Procedure: ESOPHAGOGASTRODUODENOSCOPY (EGD) WITH PROPOFOL;  Surgeon: [name], MD;  Location: [facility] ENDOSCOPY;  Service: Gastroenterology;  Laterality: N/A;  . EYE SURGERY     bilateral laser 2012  . EYE SURGERY     right  . EYE SURGERY     x4 both eyes  . GAS INSERTION  09/30/2011   Procedure: INSERTION OF GAS;  Surgeon: [name], MD;  Location: [place];  Service: Ophthalmology;  Laterality: Right;  [id number]  . GAS/FLUID EXCHANGE  09/30/2011   Procedure: GAS/FLUID EXCHANGE;  Surgeon: [name], MD;  Location: [place];  Service: Ophthalmology;  Laterality: Right;  . KIDNEY TRANSPLANT     03/30/19 Dr. Mickel Baas Thomas/Dr. Cristie Hem Zendel/Dr. Gilford Raid; [facility] transplant Dr. Horald Chestnut will be primary [phone] coordinator Sarah Wynkoop [phone]  . KIDNEY TRANSPLANT     03/30/19 [facility]  . LEFT HEART CATHETERIZATION WITH CORONARY ANGIOGRAM N/A 05/28/2013   Procedure: LEFT HEART CATHETERIZATION WITH CORONARY ANGIOGRAM;  Surgeon: [name], MD;  Location: [facility] CATH LAB;  Service: Cardiovascular;  Laterality: N/A;  . PARS PLANA VITRECTOMY  04/22/2011   Procedure: PARS PLANA VITRECTOMY WITH 25 GAUGE;  Surgeon: [name], MD;  Location: [place];  Service: Ophthalmology;  Laterality: Left;  membrane peel, endolaser, gas fluid exchange, silicone oil, repair of complex traction retinal detachment  . PARS PLANA VITRECTOMY  09/30/2011   Procedure: PARS PLANA VITRECTOMY WITH 25 GAUGE;  Surgeon: [name], MD;  Location: [place];  Service: Ophthalmology;  Laterality: Right;  Endolaser; Repair of Complex  Traction Retinal Detachment  . PARS PLANA VITRECTOMY  02/24/2012   Procedure: PARS PLANA VITRECTOMY WITH 25 GAUGE;  Surgeon: [name], MD;  Location: [place];  Service: Ophthalmology;  Laterality: Left;  . PTCA    . SILICON OIL REMOVAL  7/74/1287   Procedure: SILICON OIL REMOVAL;  Surgeon: [name], MD;  Location: [place];  Service: Ophthalmology;  Laterality: Left;  . THROMBECTOMY / ARTERIOVENOUS GRAFT REVISION    . TUBAL LIGATION     1979  . UPPER EXTREMITY ANGIOGRAPHY Left 02/08/2019   Procedure: UPPER EXTREMITY ANGIOGRAPHY;  Surgeon: Katha Cabal, MD;  Location: [place] CV LAB;  Service: Cardiovascular;  Laterality: Left;    Current Medications: No outpatient medications have been marked as taking for the 11/09/20 encounter (Appointment) with Kathlen Mody, Aynslee Mulhall H, PA-C.  Allergies:   Hydrocodone and Lisinopril   Social History   Socioeconomic History  . Marital status: Single    Spouse name: Not on file  . Number of children: 2  . Years of education: Not on file  . Highest education level: Not on file  Occupational History  . Not on file  Tobacco Use  . Smoking status: Former Smoker    Packs/day: 1.00    Years: 25.00    Pack years: 25.00    Quit date: 06/09/2010    Years since quitting: 10.4  . Smokeless tobacco: Never Used  Vaping Use  . Vaping Use: Never used  Substance and Sexual Activity  . Alcohol use: No  . Drug use: No  . Sexual activity: Never  Other Topics Concern  . Not on file  Social History Narrative   On disability.    Lives with son Brenton Grills   2 children (has son and daughter)      Son drives her since her vision has decreased   Social Determinants of Radio broadcast assistant Strain: Not on file  Food Insecurity: Not on file  Transportation Needs: Not on file  Physical Activity: Not on file  Stress: Not on file  Social Connections: Not on file     Family History: The patient's ***family history includes Breast cancer  in her sister; Colon cancer in her father and sister; Diabetes in her brother, brother, and daughter; Heart attack in her brother and mother; Heart disease in her mother; Renal Disease in her daughter; Stroke in her brother and mother. There is no history of Anesthesia problems, Hypotension, Malignant hyperthermia, or Pseudochol deficiency.  ROS:   Please see the history of present illness.    *** All other systems reviewed and are negative.  EKGs/Labs/Other Studies Reviewed:    The following studies were reviewed today: ***  EKG:  EKG is *** ordered today.  The ekg ordered today demonstrates ***  Recent Labs: 04/18/2020: ALT 9; B Natriuretic Peptide 657.1 04/20/2020: Hemoglobin 8.5; Platelets 174 04/21/2020: BUN 23; Creatinine, Ser 1.39; Potassium 4.6; Sodium 134  Recent Lipid Panel    Component Value Date/Time   CHOL 161 05/22/2016 0147   TRIG 65 05/22/2016 0147   HDL 59 05/22/2016 0147   CHOLHDL 2.7 05/22/2016 0147   VLDL 13 05/22/2016 0147   LDLCALC 89 05/22/2016 0147   LDLDIRECT 100.6 11/12/2012 0906     Risk Assessment/Calculations:   {Does this patient have ATRIAL FIBRILLATION?[phone]   Physical Exam:    VS:  There were no vitals taken for this visit.    Wt Readings from Last 3 Encounters:  05/29/20 149 lb 2 oz (67.6 kg)  04/27/20 150 lb (68 kg)  04/21/20 152 lb 5.4 oz (69.1 kg)     GEN: *** Well nourished, well developed in no acute distress HEENT: Normal NECK: No JVD; No carotid bruits LYMPHATICS: No lymphadenopathy CARDIAC: ***RRR, no murmurs, rubs, gallops RESPIRATORY:  Clear to auscultation without rales, wheezing or rhonchi  ABDOMEN: Soft, non-tender, non-distended MUSCULOSKELETAL:  No edema; No deformity  SKIN: Warm and dry NEUROLOGIC:  Alert and oriented x 3 PSYCHIATRIC:  Normal affect   ASSESSMENT:    1. Coronary artery disease of native artery of native heart with stable angina pectoris ([place])   2. Chronic diastolic congestive heart  failure ([place])   3. Mitral valve insufficiency, unspecified etiology   4. Hyperlipidemia LDL goal <70   5. ESRD (end stage renal disease) ([place])  6. Essential (primary) hypertension    PLAN:    In order of problems listed above:  HFrEF  CAD  Moderate MR  ESRD s/p renal transplant  HTN  HLD  Disposition: Follow up {follow up:15908} with ***   Shared Decision Making/Informed Consent   {Are you ordering a CV Procedure (e.g. stress test, cath, DCCV, TEE, etc)?   Press F2        :[id number]}    Signed, Tommi Crepeau Ninfa Meeker, PA-C  11/09/2020 8:01 AM    [facility]

## 2020-11-08 NOTE — Unmapped (Signed)
[facility] Refill Coordination Note    Specialty Medication(s) to be Shipped:   Transplant: Envarsus 4mg     Other medication(s) to be shipped: No additional medications requested for fill at this time     Michele Cisneros, DOB: 1956-05-09  Phone: [phone] (home)       All above HIPAA information was verified with patient.     Was a Nurse, learning disability used for this call? No    Completed refill call assessment today to schedule patient's medication shipment from the [facility] [phone]).  All relevant notes have been reviewed.     Specialty medication(s) and dose(s) confirmed: Regimen is correct and unchanged.   Changes to medications: Michele Cisneros reports no changes at this time.  Changes to insurance: No  New side effects reported not previously addressed with a pharmacist or physician: None reported  Questions for the pharmacist: No    Confirmed patient received a Conservation officer, historic buildings and a Surveyor, mining with first shipment. The patient will receive a drug information handout for each medication shipped and additional FDA Medication Guides as required.       DISEASE/MEDICATION-SPECIFIC INFORMATION        N/A    SPECIALTY MEDICATION ADHERENCE     Medication Adherence    Patient reported X missed doses in the last month: 0  Specialty Medication: tacrolimus (ENVARSUS XR) 4 mg Tb24 extended release tablet  Patient is on additional specialty medications: No        Envarsus 4mg   8 days worth of medication on hand.        Were doses missed due to medication being on hold? No      REFERRAL TO PHARMACIST     Referral to the pharmacist: Not needed      [facility]     Shipping address confirmed in Epic.     Delivery Scheduled: Yes, Expected medication delivery date: 11/12/20.     Medication will be delivered via UPS to the prescription address in Epic WAM.    Michele Cisneros   [facility] Specialty Technician

## 2020-11-09 ENCOUNTER — Ambulatory Visit: Payer: Medicare Other | Admitting: Medical

## 2020-11-12 ENCOUNTER — Encounter: Payer: Self-pay | Admitting: Podiatry

## 2020-11-12 ENCOUNTER — Ambulatory Visit: Payer: Medicare Other | Admitting: Podiatry

## 2020-11-12 ENCOUNTER — Other Ambulatory Visit: Payer: Self-pay

## 2020-11-12 DIAGNOSIS — E1142 Type 2 diabetes mellitus with diabetic polyneuropathy: Secondary | ICD-10-CM

## 2020-11-12 DIAGNOSIS — M79675 Pain in left toe(s): Secondary | ICD-10-CM

## 2020-11-12 DIAGNOSIS — B351 Tinea unguium: Secondary | ICD-10-CM

## 2020-11-12 DIAGNOSIS — M79674 Pain in right toe(s): Secondary | ICD-10-CM

## 2020-11-12 DIAGNOSIS — L608 Other nail disorders: Secondary | ICD-10-CM | POA: Diagnosis not present

## 2020-11-12 DIAGNOSIS — I739 Peripheral vascular disease, unspecified: Secondary | ICD-10-CM

## 2020-11-12 MED FILL — ENVARSUS XR 4 MG TABLET,EXTENDED RELEASE: ORAL | 30 days supply | Qty: 30 | Fill #1

## 2020-11-12 NOTE — Progress Notes (Signed)
This patient returns to my office for at risk foot care.  This patient requires this care by a professional since this patient will be at risk due to having kidney transplant, PAD, and diabetes.  This patient is unable to cut nails herself since the patient cannot reach her nails.These nails are painful walking and wearing shoes.  This patient presents for at risk foot care today.  General Appearance  Alert, conversant and in no acute stress.  Vascular  Dorsalis pedis and posterior tibial  pulses are palpable  bilaterally.  Capillary return is within normal limits  bilaterally. Temperature is within normal limits  bilaterally.  Neurologic  Senn-Weinstein monofilament wire test within normal limits  bilaterally. Muscle power within normal limits bilaterally.  Nails Thick disfigured discolored nails with subungual debris  from hallux to fifth toes bilaterally. No evidence of bacterial infection or drainage bilaterally. Right hallux nail ingrowing lateral border.  Orthopedic  No limitations of motion  feet .  No crepitus or effusions noted.  No bony pathology or digital deformities noted.  HAV  B/L.  Skin  normotropic skin with no porokeratosis noted bilaterally.  No signs of infections or ulcers noted.     Onychomycosis  Pain in right toes  Pain in left toes  Consent was obtained for treatment procedures.   Mechanical debridement of nails 1-5  bilaterally performed with a nail nipper.  Filed with dremel without incident.    Return office visit   3 months.                  Told patient to return for periodic foot care and evaluation due to potential at risk complications.   Gardiner Barefoot DPM

## 2020-11-19 DIAGNOSIS — Z940 Kidney transplant status: Principal | ICD-10-CM

## 2020-11-20 DIAGNOSIS — Z940 Kidney transplant status: Secondary | ICD-10-CM | POA: Diagnosis not present

## 2020-11-21 LAB — CBC W/ DIFFERENTIAL
BANDED NEUTROPHILS ABSOLUTE COUNT: 0 10*3/uL (ref 0.0–0.1)
BASOPHILS ABSOLUTE COUNT: 0 10*3/uL (ref 0.0–0.2)
BASOPHILS RELATIVE PERCENT: 1 %
EOSINOPHILS ABSOLUTE COUNT: 0.2 10*3/uL (ref 0.0–0.4)
EOSINOPHILS RELATIVE PERCENT: 3 %
HEMATOCRIT: 38.4 % (ref 34.0–46.6)
HEMOGLOBIN: 12.6 g/dL (ref 11.1–15.9)
IMMATURE GRANULOCYTES: 0 %
LYMPHOCYTES ABSOLUTE COUNT: 0.9 10*3/uL (ref 0.7–3.1)
LYMPHOCYTES RELATIVE PERCENT: 16 %
MEAN CORPUSCULAR HEMOGLOBIN CONC: 32.8 g/dL (ref 31.5–35.7)
MEAN CORPUSCULAR HEMOGLOBIN: 31.5 pg (ref 26.6–33.0)
MEAN CORPUSCULAR VOLUME: 96 fL (ref 79–97)
MONOCYTES ABSOLUTE COUNT: 0.4 10*3/uL (ref 0.1–0.9)
MONOCYTES RELATIVE PERCENT: 7 %
NEUTROPHILS ABSOLUTE COUNT: 3.9 10*3/uL (ref 1.4–7.0)
NEUTROPHILS RELATIVE PERCENT: 73 %
PLATELET COUNT: 142 10*3/uL — ABNORMAL LOW (ref 150–450)
RED BLOOD CELL COUNT: 4 x10E6/uL (ref 3.77–5.28)
RED CELL DISTRIBUTION WIDTH: 14.1 % (ref 11.7–15.4)
WHITE BLOOD CELL COUNT: 5.4 10*3/uL (ref 3.4–10.8)

## 2020-11-21 LAB — BASIC METABOLIC PANEL
BLOOD UREA NITROGEN: 20 mg/dL (ref 8–27)
BUN / CREAT RATIO: 12 (ref 12–28)
CALCIUM: 10 mg/dL (ref 8.7–10.3)
CHLORIDE: 103 mmol/L (ref 96–106)
CO2: 21 mmol/L (ref 20–29)
CREATININE: 1.68 mg/dL — ABNORMAL HIGH (ref 0.57–1.00)
EGFR: 34 mL/min/{1.73_m2} — ABNORMAL LOW
GLUCOSE: 92 mg/dL (ref 65–99)
POTASSIUM: 5 mmol/L (ref 3.5–5.2)
SODIUM: 135 mmol/L (ref 134–144)

## 2020-11-21 LAB — PHOSPHORUS: PHOSPHORUS, SERUM: 4.3 mg/dL (ref 3.0–4.3)

## 2020-11-21 LAB — MAGNESIUM: MAGNESIUM: 1.9 mg/dL (ref 1.6–2.3)

## 2020-11-21 NOTE — Progress Notes (Signed)
Patient ID: Theresa [name], female    DOB: 1956/01/01, 65 y.o.   MRN: [id number]  HPI Ms Mahler is a 65 y/o female with a history of PVD, HTN, hyperlipidemia, COPD, CAD with NSTEMI, kidney transplant, anemia, previous tobacco use, DM, and chronic heart failure.  Echo report from 09/12/20 reviewed and showed an EF of 60-65% along with moderate MR. Echo report from 04/18/20 reviewed and showed an EF of 60-65% along with mild LVH, moderate LAE, moderate MR and mild AS. Echo done 02/26/16 which showed an EF of 55-60%, mild AS and moderate/severe MR which is worse than previous echo done on 11/24/14. Right-sided pressure elevated at 50 mm Hg. Cardiac catheterization done November 2016.   Has not been admitted or been in the ED in the last 6 months.   Patient presents today for a follow-up visit with a chief complaint of minimal fatigue upon moderate exertion. She describes this as chronic in nature having been present for several years. She has associated shortness of breath, intermittent headaches, difficulty sleeping, anxiety and slight weight gain along with this. She denies any abdominal distention, palpitations, chest pain, cough, pedal edema or dizziness.   Has had numerous medication changes. Is supposed to be on amlodipine but she hasn't picked it up yet so she's been without it for ~ 6 days. She says that she's going to pick it up today.   BP at home ranges from 130-140's/ 50-70's  Past Medical History:  Diagnosis Date   Anemia of chronic disease    Anginal pain ([place])    Carotid arterial disease ([place])    a. 02/2013 U/S: 40-59% bilat ICA stenosis.   Chronic constipation    Chronic diastolic CHF (congestive heart failure) ([place])    a. 10/2013 Echo [facility]): EF 55-60%, mod conc LVH, mod MR, mildly dil LA, mild Ao sclerosis w/o stenosis; b. 02/2016 Echo: EF 55-60%, no rwma, Gr DD, mild AS, mod to sev MR, mildly dil LA, PASP 42mmHg; c. 07/2017 Echo [facility]): EF >55%, mild to mod LVH, Gr2 DD, Ao scl, Mod MR, mod  dil LA, nl RV fxn.   Colon polyps    COPD (chronic obstructive pulmonary disease) (HCC)    Coronary artery disease    a. 05/2013 NSTEMI/PCI: LM 20d, LAD min irregs, LCX small, nl, OM1 nl, RCA dom 10m (2.5x16 Promus DES), PDA1 80p; b. 04/2015 Cath: LM nl, LAD 68m, D1/2 min irregs, LCX 35p/m, OM2/3 min irregs, RCA patent mid stent, RPDA 70ost, RPLB1 30, RPLB2/3 min irregs, EF 55-65%--> Med Rx; c. 06/2017 [facility]): No ischemia/infarct, EF 64%.   Diabetes mellitus    Diabetic neuropathy ([place])    Diabetic retinopathy ([place]) 05/28/2013   Hx bilat retinal detachment, proliferative diab retinopathy and bilat vitreous hemorrhage    Emphysema    ESRD on hemodialysis ([place])    a. [facility] in [place], [place] [phone]/Dr. [name], on a MWF schedule.  She started dialysis in Feb 2014.  Etiology of renal failure not known, likely diabetes.  Has a left upper arm AV graft.   History of bronchitis    Mar 2012   History of pneumonia    June 2012   History of tobacco abuse    a. Quit 2012.   Hyperlipidemia    Hypertension    Moderate to severe mitral insufficiency    a. 10/2013 Echo: EF 55-60%, mod MR; b. 02/2016 Echo: EF 55-60%, mod to sev MR directed posteriorly--felt to be dynamic -worse with volume overload; c.  07/2017 Echo [facility]): EF >55%. Mod MR.   Myocardial infarct [facility]) 05/2013   Peripheral vascular disease ([place])    Sickle cell trait ([place])    Thyroid nodule    [place] 06/2017 due to f/u US in 1 year    Past Surgical History:  Procedure Laterality Date   A/V FISTULAGRAM Left 11/10/2017   Procedure: A/V FISTULAGRAM;  Surgeon: Katha Cabal, MD;  Location: [place] CV LAB;  Service: Cardiovascular;  Laterality: Left;   A/V SHUNTOGRAM Left 02/08/2019   Procedure: A/V SHUNTOGRAM;  Surgeon: Katha Cabal, MD;  Location: [place] CV LAB;  Service: Cardiovascular;  Laterality: Left;   ABDOMINAL HYSTERECTOMY     2000   CARDIAC CATHETERIZATION     CARDIAC CATHETERIZATION N/A 05/02/2015    Procedure: Left Heart Cath and Coronary Angiography;  Surgeon: [name], MD;  Location: [place] CV LAB;  Service: Cardiovascular;  Laterality: N/A;   COLONOSCOPY WITH PROPOFOL N/A 07/08/2016   Procedure: COLONOSCOPY WITH PROPOFOL;  Surgeon: [name], MD;  Location: [facility] ENDOSCOPY;  Service: Endoscopy;  Laterality: N/A;   COLONOSCOPY WITH PROPOFOL N/A 08/20/2017   Procedure: COLONOSCOPY WITH PROPOFOL;  Surgeon: [name], MD;  Location: [facility] ENDOSCOPY;  Service: Gastroenterology;  Laterality: N/A;   colonscopy     CORONARY ANGIOPLASTY  05/28/2014   stent placement to the mid RCA   CYSTOURETHROSCOPY     [facility] urology with stent placement 11 or 05/2019 and stent removal 06/13/19    DILATION AND CURETTAGE OF UTERUS     several in the early 80's   ESOPHAGOGASTRODUODENOSCOPY     2012   ESOPHAGOGASTRODUODENOSCOPY (EGD) WITH PROPOFOL N/A 03/11/2018   Procedure: ESOPHAGOGASTRODUODENOSCOPY (EGD) WITH PROPOFOL;  Surgeon: [name], MD;  Location: [facility] ENDOSCOPY;  Service: Gastroenterology;  Laterality: N/A;   EYE SURGERY     bilateral laser 2012   EYE SURGERY     right   EYE SURGERY     x4 both eyes   GAS INSERTION  09/30/2011   Procedure: INSERTION OF GAS;  Surgeon: [name], MD;  Location: [place];  Service: Ophthalmology;  Laterality: Right;  [id number]   GAS/FLUID EXCHANGE  09/30/2011   Procedure: GAS/FLUID EXCHANGE;  Surgeon: [name], MD;  Location: [place];  Service: Ophthalmology;  Laterality: Right;   KIDNEY TRANSPLANT     03/30/19 Dr. Mickel Baas Thomas/Dr. Cristie Hem Zendel/Dr. Gilford Raid; [facility] transplant Dr. Horald Chestnut will be primary [phone] coordinator Sarah Wynkoop [phone]   KIDNEY TRANSPLANT     03/30/19 [facility]   LEFT HEART CATHETERIZATION WITH CORONARY ANGIOGRAM N/A 05/28/2013   Procedure: LEFT HEART CATHETERIZATION WITH CORONARY ANGIOGRAM;  Surgeon: [name], MD;  Location: [facility] CATH LAB;  Service: Cardiovascular;  Laterality: N/A;   PARS PLANA VITRECTOMY   04/22/2011   Procedure: PARS PLANA VITRECTOMY WITH 25 GAUGE;  Surgeon: [name], MD;  Location: [place];  Service: Ophthalmology;  Laterality: Left;  membrane peel, endolaser, gas fluid exchange, silicone oil, repair of complex traction retinal detachment   PARS PLANA VITRECTOMY  09/30/2011   Procedure: PARS PLANA VITRECTOMY WITH 25 GAUGE;  Surgeon: [name], MD;  Location: [place];  Service: Ophthalmology;  Laterality: Right;  Endolaser; Repair of Complex Traction Retinal Detachment   PARS PLANA VITRECTOMY  02/24/2012   Procedure: PARS PLANA VITRECTOMY WITH 25 GAUGE;  Surgeon: [name], MD;  Location: [place];  Service: Ophthalmology;  Laterality: Left;   PTCA  SILICON OIL REMOVAL  8/36/6294   Procedure: SILICON OIL REMOVAL;  Surgeon: [name], MD;  Location: [place];  Service: Ophthalmology;  Laterality: Left;   THROMBECTOMY / ARTERIOVENOUS GRAFT REVISION     TUBAL LIGATION     1979   UPPER EXTREMITY ANGIOGRAPHY Left 02/08/2019   Procedure: UPPER EXTREMITY ANGIOGRAPHY;  Surgeon: Katha Cabal, MD;  Location: [place] CV LAB;  Service: Cardiovascular;  Laterality: Left;   Family History  Problem Relation Age of Onset   Stroke Mother    Heart attack Mother    Heart disease Mother    Colon cancer Father    Colon cancer Sister    Heart attack Brother    Stroke Brother    Diabetes Brother    Breast cancer Sister    Diabetes Brother    Diabetes Daughter    Renal Disease Daughter        on HD   Anesthesia problems Neg Hx    Hypotension Neg Hx    Malignant hyperthermia Neg Hx    Pseudochol deficiency Neg Hx    Social History   Tobacco Use   Smoking status: Former    Packs/day: 1.00    Years: 25.00    Pack years: 25.00    Types: Cigarettes    Quit date: 06/09/2010    Years since quitting: 10.4   Smokeless tobacco: Never  Substance Use Topics   Alcohol use: No   Allergies  Allergen Reactions   Hydrocodone Hives   Lisinopril     Unknown reaction     Prior to Admission medications   Medication Sig Start Date End Date Taking? Authorizing Provider  acetaminophen (TYLENOL) 500 MG tablet Take 1,000 mg by mouth daily as needed. 03/31/19  Yes [provider]  amLODipine (NORVASC) 10 MG tablet Take 1 tablet (10 mg total) by mouth daily. Patient taking differently: Take 5 mg by mouth daily. 04/22/20  Yes Wouk, Ailene Rud, MD  atorvastatin (LIPITOR) 20 MG tablet Take 20 mg by mouth daily. 04/16/20  Yes [provider]  esomeprazole (NEXIUM) 20 MG capsule Take 20 mg by mouth daily. 04/16/20  Yes [provider]  furosemide (LASIX) 20 MG tablet Take 1 tablet (20 mg total) by mouth daily as needed (shortness of breath). 04/27/20  Yes Minna Merritts, MD  loratadine (CLARITIN) 10 MG tablet Take 10 mg by mouth daily.   Yes [provider]  magnesium oxide (MAG-OX) 400 MG tablet Take 400 mg by mouth 2 (two) times daily.   Yes [provider]  nitroGLYCERIN (NITROSTAT) 0.4 MG SL tablet Place 1 tablet (0.4 mg total) under the tongue every 5 (five) minutes as needed for chest pain. 04/27/20  Yes Minna Merritts, MD  pregabalin (LYRICA) 75 MG capsule Take 1 capsule (75 mg total) by mouth 2 (two) times daily. 06/20/19  Yes McLean-Scocuzza, Nino Glow, MD  sodium bicarbonate 650 MG tablet Take 650 mg by mouth 2 (two) times daily.   Yes [provider]  Tacrolimus ER 4 MG TB24 Take 7 mg by mouth every morning.   Yes [provider]  UNABLE TO FIND daily. 09/26/13  Yes [provider]  Vitamin D, Ergocalciferol, (DRISDOL) 1.25 MG (50000 UNIT) CAPS capsule Take 50,000 Units by mouth every 7 (seven) days. Mondays   Yes [provider]  albuterol (PROVENTIL) (2.5 MG/3ML) 0.083% nebulizer solution Take 3 mLs (2.5 mg total) by nebulization every 6 (six) hours as needed for wheezing  or shortness of breath. Patient not taking: Reported on 11/22/2020 09/23/18   McLean-Scocuzza, Nino Glow, MD   albuterol (VENTOLIN HFA) 108 (90 Base) MCG/ACT inhaler Inhale 1-2 puffs into the lungs every 6 (six) hours as needed for wheezing or shortness of breath. Patient not taking: Reported on 11/22/2020 09/23/18   McLean-Scocuzza, Nino Glow, MD  aspirin EC 81 MG tablet Take 1 tablet (81 mg total) by mouth at bedtime. Patient not taking: Reported on 11/22/2020 12/02/18   Theora Gianotti, NP  carvedilol (COREG) 12.5 MG tablet Take 1 tablet (12.5 mg total) by mouth 2 (two) times daily with a meal. Patient not taking: Reported on 11/22/2020 04/21/20   Wouk, Ailene Rud, MD  cloNIDine (CATAPRES) 0.1 MG tablet Take 1 tablet (0.1 mg total) by mouth 2 (two) times daily. Patient not taking: Reported on 11/22/2020 04/21/20   Gwynne Edinger, MD    Review of Systems  Constitutional:  Positive for fatigue (minimal). Negative for appetite change.  HENT:  Negative for congestion and postnasal drip.   Eyes: Negative.   Respiratory:  Positive for shortness of breath (minimal). Negative for cough and chest tightness.   Cardiovascular:  Negative for chest pain, palpitations and leg swelling.  Gastrointestinal:  Negative for abdominal distention and abdominal pain.  Endocrine: Negative.   Genitourinary: Negative.   Musculoskeletal:  Positive for arthralgias (right hip pain). Negative for neck pain.  Skin: Negative.   Allergic/Immunologic: Negative.   Neurological:  Positive for headaches (at times when she wakes up). Negative for dizziness and light-headedness.  Hematological:  Negative for adenopathy. Does not bruise/bleed easily.  Psychiatric/Behavioral:  Positive for sleep disturbance (sleeping on 1 pillow). Negative for dysphoric mood. The patient is nervous/anxious (at times).    Vitals:   11/22/20 0906 11/22/20 0927  BP: (!) 184/54 (!) 152/60  Pulse: 73   Resp: 20   SpO2: 100%   Weight: 154 lb 12.8 oz (70.2 kg)   Height: 5\' 7"  (1.702 m)    Wt Readings from Last 3 Encounters:  11/22/20 154 lb  12.8 oz (70.2 kg)  05/29/20 149 lb 2 oz (67.6 kg)  04/27/20 150 lb (68 kg)   Lab Results  Component Value Date   CREATININE 1.39 (H) 04/21/2020   CREATININE 1.68 (H) 04/20/2020   CREATININE 1.65 (H) 04/19/2020    Physical Exam Vitals reviewed.  Constitutional:      Appearance: Normal appearance.  HENT:     Head: Normocephalic and atraumatic.  Cardiovascular:     Rate and Rhythm: Normal rate and regular rhythm.  Pulmonary:     Effort: Pulmonary effort is normal. No respiratory distress.     Breath sounds: No wheezing or rales.  Abdominal:     General: There is no distension.     Palpations: Abdomen is soft.  Musculoskeletal:        General: No tenderness.     Cervical back: Normal range of motion and neck supple.     Right lower leg: No edema.     Left lower leg: No edema.  Skin:    General: Skin is warm and dry.  Neurological:     General: No focal deficit present.     Mental Status: She is alert and oriented to person, place, and time.  Psychiatric:        Mood and Affect: Mood normal.        Behavior: Behavior normal.        Thought Content: Thought content normal.  Assessment & Plan:  1: Chronic heart failure with preserved ejection fraction without structural changes-  - NYHA class II - euvolemic  - weighing daily; reminded to call for an overnight weight gain of >2 pounds/weekly weight gain of >5 pounds.  - weight up 5 pounds from last visit here 6 months ago - tries not to salt foods; has been using salt substitute, when eats canned vegetables will thoroughly rinse them; reminded to closely follow a 2000mg  sodium diet - saw cardiology Rockey Situ) 04/27/20  - BNP 04/18/20 was 657.1 - PharmD reconciled medications with the patient  2: HTN- - BP initially elevated (184/54) but rechecked at the end of the visit was improved (152/60) - she is going to pick up her amlodipine today - saw PCP (McLean-Scocuzza) 02/24/20 - CMP 11/20/20 reviewed and showed sodium 135,  potassium 5.0, creatinine 1.68 and GFR 34  3: Transplant- - has had renal transplant & reports doing well with that - saw transplant provider (Detwiler) 09/12/20   Medication packs reviewed.   Due to HF stability, will not make a return appointment for patient at this time. Advised patient that she could call back at anytime to schedule another appointment and she was comfortable with this plan.

## 2020-11-22 ENCOUNTER — Encounter: Payer: Self-pay | Admitting: Family

## 2020-11-22 ENCOUNTER — Encounter: Payer: Self-pay | Admitting: Pharmacist

## 2020-11-22 ENCOUNTER — Other Ambulatory Visit: Payer: Self-pay

## 2020-11-22 ENCOUNTER — Ambulatory Visit: Payer: Medicare Other | Attending: Family | Admitting: Family

## 2020-11-22 VITALS — BP 152/60 | HR 73 | Resp 20 | Ht 67.0 in | Wt 154.8 lb

## 2020-11-22 DIAGNOSIS — J439 Emphysema, unspecified: Secondary | ICD-10-CM | POA: Insufficient documentation

## 2020-11-22 DIAGNOSIS — Z79899 Other long term (current) drug therapy: Secondary | ICD-10-CM | POA: Insufficient documentation

## 2020-11-22 DIAGNOSIS — E785 Hyperlipidemia, unspecified: Secondary | ICD-10-CM | POA: Insufficient documentation

## 2020-11-22 DIAGNOSIS — I110 Hypertensive heart disease with heart failure: Secondary | ICD-10-CM | POA: Diagnosis not present

## 2020-11-22 DIAGNOSIS — Z940 Kidney transplant status: Secondary | ICD-10-CM | POA: Diagnosis not present

## 2020-11-22 DIAGNOSIS — I252 Old myocardial infarction: Secondary | ICD-10-CM | POA: Diagnosis not present

## 2020-11-22 DIAGNOSIS — I10 Essential (primary) hypertension: Secondary | ICD-10-CM

## 2020-11-22 DIAGNOSIS — R519 Headache, unspecified: Secondary | ICD-10-CM | POA: Diagnosis not present

## 2020-11-22 DIAGNOSIS — Z8249 Family history of ischemic heart disease and other diseases of the circulatory system: Secondary | ICD-10-CM | POA: Insufficient documentation

## 2020-11-22 DIAGNOSIS — I2510 Atherosclerotic heart disease of native coronary artery without angina pectoris: Secondary | ICD-10-CM | POA: Diagnosis not present

## 2020-11-22 DIAGNOSIS — I5032 Chronic diastolic (congestive) heart failure: Secondary | ICD-10-CM | POA: Insufficient documentation

## 2020-11-22 DIAGNOSIS — R0602 Shortness of breath: Secondary | ICD-10-CM | POA: Insufficient documentation

## 2020-11-22 DIAGNOSIS — Z7901 Long term (current) use of anticoagulants: Secondary | ICD-10-CM | POA: Insufficient documentation

## 2020-11-22 DIAGNOSIS — Z87891 Personal history of nicotine dependence: Secondary | ICD-10-CM | POA: Insufficient documentation

## 2020-11-22 LAB — TACROLIMUS LEVEL: TACROLIMUS BLOOD: 2.7 ng/mL (ref 2.0–20.0)

## 2020-11-22 NOTE — Patient Instructions (Addendum)
Continue weighing daily and call for an overnight weight gain of > 2 pounds or a weekly weight gain of >5 pounds.    Call us in the future if you need us for anything  

## 2020-11-22 NOTE — Progress Notes (Signed)
[facility] - PHARMACIST COUNSELING NOTE  Guideline-Directed Medical Therapy/Evidence Based Medicine  ACE/ARB/ARNI: N/A Beta Blocker:  N/A Aldosterone Antagonist:  N/A Diuretic: Furosemide 20 mg daily as needed SGLT2i:  N/A  Adherence Assessment  Do you ever forget to take your medication? [] Yes [x] No  Do you ever skip doses due to side effects? [] Yes [x] No  Do you have trouble affording your medicines? [] Yes [x] No  Are you ever unable to pick up your medication due to transportation difficulties? [] Yes [x] No  Do you ever stop taking your medications because you don't believe they are helping? [] Yes [x] No  Do you check your weight daily? [x] Yes* [] No  *Pt checks her weight every other day   Adherence strategy: Pillbox - pt fills it herself  Barriers to obtaining medications: N/A  Vital signs: HR 73, BP 184/54, weight (pounds) 154.8 ECHO: Date 04/18/2020, EF 60-65%, notes mild LVH  BMP Latest Ref Rng & Units 04/21/2020 04/20/2020 04/19/2020  Glucose 70 - 99 mg/dL 176(H) 106(H) 102(H)  BUN 8 - 23 mg/dL 23 24(H) 25(H)  Creatinine 0.44 - 1.00 mg/dL 1.39(H) 1.68(H) 1.65(H)  BUN/Creat Ratio 9 - 23 - - -  Sodium 135 - 145 mmol/L 134(L) 135 136  Potassium 3.5 - 5.1 mmol/L 4.6 5.5(H) 5.3(H)  Chloride 98 - 111 mmol/L 105 105 106  CO2 22 - 32 mmol/L 23 25 24   Calcium 8.9 - 10.3 mg/dL 9.2 9.5 9.4    Past Medical History:  Diagnosis Date   Anemia of chronic disease    Anginal pain (HCC)    Carotid arterial disease ([place])    a. 02/2013 U/S: 40-59% bilat ICA stenosis.   Chronic constipation    Chronic diastolic CHF (congestive heart failure) ([place])    a. 10/2013 Echo [facility]): EF 55-60%, mod conc LVH, mod MR, mildly dil LA, mild Ao sclerosis w/o stenosis; b. 02/2016 Echo: EF 55-60%, no rwma, Gr DD, mild AS, mod to sev MR, mildly dil LA, PASP 107mmHg; c. 07/2017 Echo [facility]): EF >55%, mild to mod LVH, Gr2 DD, Ao scl, Mod MR, mod dil LA, nl RV fxn.    Colon polyps    COPD (chronic obstructive pulmonary disease) (HCC)    Coronary artery disease    a. 05/2013 NSTEMI/PCI: LM 20d, LAD min irregs, LCX small, nl, OM1 nl, RCA dom 52m (2.5x16 Promus DES), PDA1 80p; b. 04/2015 Cath: LM nl, LAD 44m, D1/2 min irregs, LCX 35p/m, OM2/3 min irregs, RCA patent mid stent, RPDA 70ost, RPLB1 30, RPLB2/3 min irregs, EF 55-65%--> Med Rx; c. 06/2017 [facility]): No ischemia/infarct, EF 64%.   Diabetes mellitus    Diabetic neuropathy ([place])    Diabetic retinopathy ([place]) 05/28/2013   Hx bilat retinal detachment, proliferative diab retinopathy and bilat vitreous hemorrhage    Emphysema    ESRD on hemodialysis ([place])    a. [facility] in [place], [place] [phone]/Dr. [name], on a MWF schedule.  She started dialysis in Feb 2014.  Etiology of renal failure not known, likely diabetes.  Has a left upper arm AV graft.   History of bronchitis    Mar 2012   History of pneumonia    June 2012   History of tobacco abuse    a. Quit 2012.   Hyperlipidemia    Hypertension    Moderate to severe mitral insufficiency    a. 10/2013 Echo: EF 55-60%, mod MR; b. 02/2016 Echo: EF 55-60%, mod to sev MR directed posteriorly--felt to be dynamic -worse  with volume overload; c. 07/2017 Echo [facility]): EF >55%. Mod MR.   Myocardial infarct [facility]) 05/2013   Peripheral vascular disease ([place])    Sickle cell trait ([place])    Thyroid nodule    [place] 06/2017 due to f/u [place] in 1 year     ASSESSMENT 65 year old female who presents to the HF clinic for a follow up visit. Pt BP slightly elevated today, however pt states it is normally 130-140s/50-70s when she checks it at home. Pt had a kidney transplant in October 2020 for which she takes Envarsus XR. Pt's amlodipine, clonidine and carvedilol were stopped by Dr. Kerby Moors due to postural dizziness; this had improved and pt was restarted on amlodipine 5 mg daily - pt has not had this in 6 days but states she is going to pick it up from the pharmacy today. Pt denies  any recent episodes/symptoms of hypotension.   Recent ED Visit (past 6 months): Date - N/A  PLAN CHF/HTN -continue amlodipine 5 mg daily (once picked up from pharmacy) and furosemide 20 mg daily as needed -continue weight checks  HLD/CAD with NSTEMI -continue atorvastatin 20 mg (unable to increase to high intensity dose due to myopathy -encouraged pt to restart taking aspirin 81 mg daily  -consider restarting beta blocker if BP/HR tolerate  Kidney transplant -continue tacrolimus ER 7 mg daily   Hx of esophageal ulcers -continue esomeprazole 20 mg daily    Time spent: 10 minutes  Sherilyn Banker, PharmD Pharmacy Resident  11/22/2020 9:43 AM    Current Outpatient Medications:    acetaminophen (TYLENOL) 500 MG tablet, Take 1,000 mg by mouth daily as needed., Disp: , Rfl:    albuterol (PROVENTIL) (2.5 MG/3ML) 0.083% nebulizer solution, Take 3 mLs (2.5 mg total) by nebulization every 6 (six) hours as needed for wheezing or shortness of breath. (Patient not taking: Reported on 11/22/2020), Disp: 75 mL, Rfl: 11   albuterol (VENTOLIN HFA) 108 (90 Base) MCG/ACT inhaler, Inhale 1-2 puffs into the lungs every 6 (six) hours as needed for wheezing or shortness of breath. (Patient not taking: Reported on 11/22/2020), Disp: 1 Inhaler, Rfl: 12   amLODipine (NORVASC) 10 MG tablet, Take 1 tablet (10 mg total) by mouth daily. (Patient taking differently: Take 5 mg by mouth daily.), Disp: 30 tablet, Rfl: 1   aspirin EC 81 MG tablet, Take 1 tablet (81 mg total) by mouth at bedtime. (Patient not taking: Reported on 11/22/2020), Disp: 30 tablet, Rfl: 11   atorvastatin (LIPITOR) 20 MG tablet, Take 20 mg by mouth daily., Disp: , Rfl:    carvedilol (COREG) 12.5 MG tablet, Take 1 tablet (12.5 mg total) by mouth 2 (two) times daily with a meal. (Patient not taking: Reported on 11/22/2020), Disp: 180 tablet, Rfl: 1   cloNIDine (CATAPRES) 0.1 MG tablet, Take 1 tablet (0.1 mg total) by mouth 2 (two) times daily.  (Patient not taking: Reported on 11/22/2020), Disp: 60 tablet, Rfl: 1   esomeprazole (NEXIUM) 20 MG capsule, Take 20 mg by mouth daily., Disp: , Rfl:    furosemide (LASIX) 20 MG tablet, Take 1 tablet (20 mg total) by mouth daily as needed (shortness of breath)., Disp: 30 tablet, Rfl: 0   loratadine (CLARITIN) 10 MG tablet, Take 10 mg by mouth daily., Disp: , Rfl:    magnesium oxide (MAG-OX) 400 MG tablet, Take 400 mg by mouth 2 (two) times daily., Disp: , Rfl:    nitroGLYCERIN (NITROSTAT) 0.4 MG SL tablet, Place 1 tablet (0.4 mg  total) under the tongue every 5 (five) minutes as needed for chest pain., Disp: 25 tablet, Rfl: 3   pregabalin (LYRICA) 75 MG capsule, Take 1 capsule (75 mg total) by mouth 2 (two) times daily., Disp: , Rfl:    sodium bicarbonate 650 MG tablet, Take 650 mg by mouth 2 (two) times daily., Disp: , Rfl:    Tacrolimus ER 4 MG TB24, Take 7 mg by mouth every morning., Disp: , Rfl:    UNABLE TO FIND, daily., Disp: , Rfl:    Vitamin D, Ergocalciferol, (DRISDOL) 1.25 MG (50000 UNIT) CAPS capsule, Take 50,000 Units by mouth every 7 (seven) days. Mondays, Disp: , Rfl:    DRUGS TO CAUTION IN HEART FAILURE  Drug or Class Mechanism  Analgesics NSAIDs COX-2 inhibitors Glucocorticoids  Sodium and water retention, increased systemic vascular resistance, decreased response to diuretics   Diabetes Medications Metformin Thiazolidinediones Rosiglitazone (Avandia) Pioglitazone (Actos) DPP4 Inhibitors Saxagliptin (Onglyza) Sitagliptin (Januvia)   Lactic acidosis Possible calcium channel blockade   Unknown  Antiarrhythmics Class I  Flecainide Disopyramide Class III Sotalol Other Dronedarone  Negative inotrope, proarrhythmic   Proarrhythmic, beta blockade  Negative inotrope  Antihypertensives Alpha Blockers Doxazosin Calcium Channel Blockers Diltiazem Verapamil Nifedipine Central Alpha Adrenergics Moxonidine Peripheral Vasodilators Minoxidil  Increases renin  and aldosterone  Negative inotrope    Possible sympathetic withdrawal  Unknown  Anti-infective Itraconazole Amphotericin B  Negative inotrope Unknown  Hematologic Anagrelide Cilostazol   Possible inhibition of PD IV Inhibition of PD III causing arrhythmias  Neurologic/Psychiatric Stimulants Anti-Seizure Drugs Carbamazepine Pregabalin Antidepressants Tricyclics Citalopram Parkinsons Bromocriptine Pergolide Pramipexole Antipsychotics Clozapine Antimigraine Ergotamine Methysergide Appetite suppressants Bipolar Lithium  Peripheral alpha and beta agonist activity  Negative inotrope and chronotrope Calcium channel blockade  Negative inotrope, proarrhythmic Dose-dependent QT prolongation  Excessive serotonin activity/valvular damage Excessive serotonin activity/valvular damage Unknown  IgE mediated hypersensitivy, calcium channel blockade  Excessive serotonin activity/valvular damage Excessive serotonin activity/valvular damage Valvular damage  Direct myofibrillar degeneration, adrenergic stimulation  Antimalarials Chloroquine Hydroxychloroquine Intracellular inhibition of lysosomal enzymes  Urologic Agents Alpha Blockers Doxazosin Prazosin Tamsulosin Terazosin  Increased renin and aldosterone  Adapted from Page Carleene Overlie, et al. "Drugs That May Cause or Exacerbate Heart Failure: A Scientific Statement from the American Heart  Association." Circulation 2016; 134:e32-e69. DOI: 10.1161/CIR.0000000000000426   MEDICATION ADHERENCES TIPS AND STRATEGIES Taking medication as prescribed improves patient outcomes in heart failure (reduces hospitalizations, improves symptoms, increases survival) Side effects of medications can be managed by decreasing doses, switching agents, stopping drugs, or adding additional therapy. Please let someone in the [facility] know if you have having bothersome side effects so we can modify your regimen. Do not alter your  medication regimen without talking to [place].  Medication reminders can help patients remember to take drugs on time. If you are missing or forgetting doses you can try linking behaviors, using pill boxes, or an electronic reminder like an alarm on your phone or an app. Some people can also get automated phone calls as medication reminders.

## 2020-11-27 DIAGNOSIS — Z940 Kidney transplant status: Principal | ICD-10-CM

## 2020-11-27 MED ORDER — TACROLIMUS XR 4 MG TABLET,EXTENDED RELEASE 24 HR
ORAL_TABLET | Freq: Every day | ORAL | 11 refills | 30.00000 days | Status: CP
Start: 2020-11-27 — End: 2021-11-27
  Filled 2020-12-20: qty 60, 30d supply, fill #0

## 2020-11-27 NOTE — Unmapped (Signed)
Reviewed labs with Dr Margaretmary Bayley, increase envarsus to 8mg      Called patient and reviewed labs with her. Last tac level was good 24 hour trough    She will increase to 8mg  envarsus tomorrow    Reprots she is feeling great.  Not having to use cane and has regained strength in her legs. Denies any n/v/d and has gained 5 lbs. Sees her pcp on the 28th for a physical    Needs follow up apt. Request sent

## 2020-11-28 NOTE — Unmapped (Addendum)
[facility] Pharmacist has reviewed this new prescription.  Patient was counseled on this dosage change by SW- see epic note from 11/27/20.  Next refill call date adjusted if necessary.          Clinical Assessment Needed For: Dose Change  Medication: Envarsus XR 4mg  tablet  Last Fill Date/Day Supply: 11/12/2020 / 30 days  Copay $303.81  Was previous dose already scheduled to fill: No    Notes to Pharmacist: N/A

## 2020-12-03 ENCOUNTER — Ambulatory Visit (INDEPENDENT_AMBULATORY_CARE_PROVIDER_SITE_OTHER): Payer: Medicare Other

## 2020-12-03 VITALS — Ht 67.0 in | Wt 154.0 lb

## 2020-12-03 DIAGNOSIS — Z940 Kidney transplant status: Principal | ICD-10-CM

## 2020-12-03 DIAGNOSIS — Z0000 Encounter for general adult medical examination without abnormal findings: Secondary | ICD-10-CM | POA: Diagnosis not present

## 2020-12-03 NOTE — Patient Instructions (Addendum)
Theresa [name] , Thank you for taking time to come for your Medicare Wellness Visit. I appreciate your ongoing commitment to your health goals. Please review the following plan we discussed and let me know if I can assist you in the future.   These are the goals we discussed:  Goals      Healthy Lifestyle     Stay active Stay hydrated Healthy diet         This is a list of the screening recommended for you and due dates:  Health Maintenance  Topic Date Due   Hemoglobin A1C  10/16/2020   Zoster (Shingles) Vaccine (1 of 2) 03/05/2021*   Pneumococcal Vaccination (1 - PCV) 12/03/2021*   Colon Cancer Screening  12/03/2021*   Tetanus Vaccine  12/03/2021*   Eye exam for diabetics  12/04/2020   COVID-19 Vaccine (4 - Booster for Pfizer series) 12/12/2020   Flu Shot  01/07/2021   Complete foot exam   11/12/2021   Mammogram  03/15/2022   Pneumococcal vaccine  Completed   Hepatitis C Screening: USPSTF Recommendation to screen - Ages 18-79 yo.  Completed   HIV Screening  Completed   HPV Vaccine  Aged Out   Pap Smear  Discontinued  *Topic was postponed. The date shown is not the original due date.   Advanced directives: End of life planning; Advance aging; Advanced directives discussed.  Copy of current HCPOA/Living Will requested.    Conditions/risks identified: none new.  Follow up in one year for your annual wellness visit.    Preventive Care 29 Years and Older, Female Preventive care refers to lifestyle choices and visits with your health care provider that can promote health and wellness. What does preventive care include? A yearly physical exam. This is also called an annual well check. Dental exams once or twice a year. Routine eye exams. Ask your health care provider how often you should have your eyes checked. Personal lifestyle choices, including: Daily care of your teeth and gums. Regular physical activity. Eating a healthy diet. Avoiding tobacco and drug use. Limiting  alcohol use. Practicing safe sex. Taking low-dose aspirin every day. Taking vitamin and mineral supplements as recommended by your health care provider. What happens during an annual well check? The services and screenings done by your health care provider during your annual well check will depend on your age, overall health, lifestyle risk factors, and family history of disease. Counseling  Your health care provider may ask you questions about your: Alcohol use. Tobacco use. Drug use. Emotional well-being. Home and relationship well-being. Sexual activity. Eating habits. History of falls. Memory and ability to understand (cognition). Work and work Statistician. Reproductive health. Screening  You may have the following tests or measurements: Height, weight, and BMI. Blood pressure. Lipid and cholesterol levels. These may be checked every 5 years, or more frequently if you are over 78 years old. Skin check. Lung cancer screening. You may have this screening every year starting at age 54 if you have a 30-pack-year history of smoking and currently smoke or have quit within the past 15 years. Fecal occult blood test (FOBT) of the stool. You may have this test every year starting at age 34. Flexible sigmoidoscopy or colonoscopy. You may have a sigmoidoscopy every 5 years or a colonoscopy every 10 years starting at age 14. Hepatitis C blood test. Hepatitis B blood test. Sexually transmitted disease (STD) testing. Diabetes screening. This is done by checking your blood sugar (glucose) after you have not  eaten for a while (fasting). You may have this done every 1-3 years. Bone density scan. This is done to screen for osteoporosis. You may have this done starting at age 55. Mammogram. This may be done every 1-2 years. Talk to your health care provider about how often you should have regular mammograms. Talk with your health care provider about your test results, treatment options, and if  necessary, the need for more tests. Vaccines  Your health care provider may recommend certain vaccines, such as: Influenza vaccine. This is recommended every year. Tetanus, diphtheria, and acellular pertussis (Tdap, Td) vaccine. You may need a Td booster every 10 years. Zoster vaccine. You may need this after age 43. Pneumococcal 13-valent conjugate (PCV13) vaccine. One dose is recommended after age 74. Pneumococcal polysaccharide (PPSV23) vaccine. One dose is recommended after age 25. Talk to your health care provider about which screenings and vaccines you need and how often you need them. This information is not intended to replace advice given to you by your health care provider. Make sure you discuss any questions you have with your health care provider. Document Released: 06/22/2015 Document Revised: 02/13/2016 Document Reviewed: 03/27/2015 Elsevier Interactive Patient Education  2017 [place] Prevention in the Home Falls can cause injuries. They can happen to people of all ages. There are many things you can do to make your home safe and to help prevent falls. What can I do on the outside of my home? Regularly fix the edges of walkways and driveways and fix any cracks. Remove anything that might make you trip as you walk through a door, such as a raised step or threshold. Trim any bushes or trees on the path to your home. Use bright outdoor lighting. Clear any walking paths of anything that might make someone trip, such as rocks or tools. Regularly check to see if handrails are loose or broken. Make sure that both sides of any steps have handrails. Any raised decks and porches should have guardrails on the edges. Have any leaves, snow, or ice cleared regularly. Use sand or salt on walking paths during winter. Clean up any spills in your garage right away. This includes oil or grease spills. What can I do in the bathroom? Use night lights. Install grab bars by the toilet  and in the tub and shower. Do not use towel bars as grab bars. Use non-skid mats or decals in the tub or shower. If you need to sit down in the shower, use a plastic, non-slip stool. Keep the floor dry. Clean up any water that spills on the floor as soon as it happens. Remove soap buildup in the tub or shower regularly. Attach bath mats securely with double-sided non-slip rug tape. Do not have throw rugs and other things on the floor that can make you trip. What can I do in the bedroom? Use night lights. Make sure that you have a light by your bed that is easy to reach. Do not use any sheets or blankets that are too big for your bed. They should not hang down onto the floor. Have a firm chair that has side arms. You can use this for support while you get dressed. Do not have throw rugs and other things on the floor that can make you trip. What can I do in the kitchen? Clean up any spills right away. Avoid walking on wet floors. Keep items that you use a lot in easy-to-reach places. If you need to reach  something above you, use a strong step stool that has a grab bar. Keep electrical cords out of the way. Do not use floor polish or wax that makes floors slippery. If you must use wax, use non-skid floor wax. Do not have throw rugs and other things on the floor that can make you trip. What can I do with my stairs? Do not leave any items on the stairs. Make sure that there are handrails on both sides of the stairs and use them. Fix handrails that are broken or loose. Make sure that handrails are as long as the stairways. Check any carpeting to make sure that it is firmly attached to the stairs. Fix any carpet that is loose or worn. Avoid having throw rugs at the top or bottom of the stairs. If you do have throw rugs, attach them to the floor with carpet tape. Make sure that you have a light switch at the top of the stairs and the bottom of the stairs. If you do not have them, ask someone to add  them for you. What else can I do to help prevent falls? Wear shoes that: Do not have high heels. Have rubber bottoms. Are comfortable and fit you well. Are closed at the toe. Do not wear sandals. If you use a stepladder: Make sure that it is fully opened. Do not climb a closed stepladder. Make sure that both sides of the stepladder are locked into place. Ask someone to hold it for you, if possible. Clearly mark and make sure that you can see: Any grab bars or handrails. First and last steps. Where the edge of each step is. Use tools that help you move around (mobility aids) if they are needed. These include: Canes. Walkers. Scooters. Crutches. Turn on the lights when you go into a dark area. Replace any light bulbs as soon as they burn out. Set up your furniture so you have a clear path. Avoid moving your furniture around. If any of your floors are uneven, fix them. If there are any pets around you, be aware of where they are. Review your medicines with your doctor. Some medicines can make you feel dizzy. This can increase your chance of falling. Ask your doctor what other things that you can do to help prevent falls. This information is not intended to replace advice given to you by your health care provider. Make sure you discuss any questions you have with your health care provider. Document Released: 03/22/2009 Document Revised: 11/01/2015 Document Reviewed: 06/30/2014 Elsevier Interactive Patient Education  2017 [employer].

## 2020-12-03 NOTE — Progress Notes (Signed)
Subjective:   Theresa [name] is a 65 y.o. female who presents for Medicare Annual (Subsequent) preventive examination.  Review of Systems    No ROS.  Medicare Wellness Virtual Visit.  Visual/audio telehealth visit, UTA vital signs.   See social history for additional risk factors.   Cardiac Risk Factors include: advanced age (>58men, >46 women);diabetes mellitus;hypertension     Objective:    Today's Vitals   12/03/20 1334  Weight: 154 lb (69.9 kg)  Height: 5\' 7"  (1.702 m)   Body mass index is 24.12 kg/m.  Advanced Directives 12/03/2020 04/18/2020 04/18/2020 12/01/2019 02/08/2019 03/11/2018 03/04/2018  Does Patient Have a Medical Advance Directive? Yes No Yes Yes No Yes No  Type of Printmaker [employer] [place];Living will [place];Living will - [place];Living will -  Does patient want to make changes to medical advance directive? No - Patient declined - No - Patient declined No - Patient declined - - Yes (MAU/Ambulatory/Procedural Areas - Information given)  Copy of Angie in Chart? No - copy requested - No - copy requested No - copy requested - No - copy requested -  Would patient like information on creating a medical advance directive? - No - Guardian declined - - No - Patient declined - -  Pre-existing out of facility DNR order (yellow form or pink MOST form) - - - - - - -    Current Medications (verified) Outpatient Encounter Medications as of 12/03/2020  Medication Sig   esomeprazole (NEXIUM) 20 MG capsule Take 20 mg by mouth daily.   loratadine (CLARITIN) 10 MG tablet Take 10 mg by mouth daily.   magnesium oxide (MAG-OX) 400 MG tablet Take 400 mg by mouth 2 (two) times daily.   pregabalin (LYRICA) 75 MG capsule Take 1 capsule (75 mg total) by mouth 2 (two) times daily.   sodium bicarbonate 650 MG tablet Take 650 mg by mouth 2 (two) times daily.   Tacrolimus ER 4 MG  TB24 Take 7 mg by mouth every morning.   UNABLE TO FIND daily.   Vitamin D, Ergocalciferol, (DRISDOL) 1.25 MG (50000 UNIT) CAPS capsule Take 50,000 Units by mouth every 7 (seven) days. Mondays   acetaminophen (TYLENOL) 500 MG tablet Take 1,000 mg by mouth daily as needed.   albuterol (PROVENTIL) (2.5 MG/3ML) 0.083% nebulizer solution Take 3 mLs (2.5 mg total) by nebulization every 6 (six) hours as needed for wheezing or shortness of breath. (Patient not taking: Reported on 11/22/2020)   albuterol (VENTOLIN HFA) 108 (90 Base) MCG/ACT inhaler Inhale 1-2 puffs into the lungs every 6 (six) hours as needed for wheezing or shortness of breath. (Patient not taking: Reported on 11/22/2020)   amLODipine (NORVASC) 10 MG tablet Take 1 tablet (10 mg total) by mouth daily. (Patient taking differently: Take 5 mg by mouth daily.)   aspirin EC 81 MG tablet Take 1 tablet (81 mg total) by mouth at bedtime. (Patient not taking: Reported on 11/22/2020)   atorvastatin (LIPITOR) 20 MG tablet Take 20 mg by mouth daily. (Patient not taking: Reported on 12/03/2020)   carvedilol (COREG) 12.5 MG tablet Take 1 tablet (12.5 mg total) by mouth 2 (two) times daily with a meal. (Patient not taking: Reported on 11/22/2020)   cloNIDine (CATAPRES) 0.1 MG tablet Take 1 tablet (0.1 mg total) by mouth 2 (two) times daily. (Patient not taking: Reported on 11/22/2020)   furosemide (LASIX) 20 MG tablet Take  1 tablet (20 mg total) by mouth daily as needed (shortness of breath).   nitroGLYCERIN (NITROSTAT) 0.4 MG SL tablet Place 1 tablet (0.4 mg total) under the tongue every 5 (five) minutes as needed for chest pain.   No facility-administered encounter medications on file as of 12/03/2020.    Allergies (verified) Hydrocodone and Lisinopril   History: Past Medical History:  Diagnosis Date   Anemia of chronic disease    Anginal pain ([place])    Carotid arterial disease ([place])    a. 02/2013 U/S: 40-59% bilat ICA stenosis.   Chronic constipation     Chronic diastolic CHF (congestive heart failure) ([place])    a. 10/2013 Echo [facility]): EF 55-60%, mod conc LVH, mod MR, mildly dil LA, mild Ao sclerosis w/o stenosis; b. 02/2016 Echo: EF 55-60%, no rwma, Gr DD, mild AS, mod to sev MR, mildly dil LA, PASP 37mmHg; c. 07/2017 Echo [facility]): EF >55%, mild to mod LVH, Gr2 DD, Ao scl, Mod MR, mod dil LA, nl RV fxn.   Colon polyps    COPD (chronic obstructive pulmonary disease) (HCC)    Coronary artery disease    a. 05/2013 NSTEMI/PCI: LM 20d, LAD min irregs, LCX small, nl, OM1 nl, RCA dom 25m (2.5x16 Promus DES), PDA1 80p; b. 04/2015 Cath: LM nl, LAD 28m, D1/2 min irregs, LCX 35p/m, OM2/3 min irregs, RCA patent mid stent, RPDA 70ost, RPLB1 30, RPLB2/3 min irregs, EF 55-65%--> Med Rx; c. 06/2017 [facility]): No ischemia/infarct, EF 64%.   Diabetes mellitus    Diabetic neuropathy ([place])    Diabetic retinopathy ([place]) 05/28/2013   Hx bilat retinal detachment, proliferative diab retinopathy and bilat vitreous hemorrhage    Emphysema    ESRD on hemodialysis ([place])    a. [facility] in [place], [place] [phone]/Dr. [name], on a MWF schedule.  She started dialysis in Feb 2014.  Etiology of renal failure not known, likely diabetes.  Has a left upper arm AV graft.   History of bronchitis    Mar 2012   History of pneumonia    June 2012   History of tobacco abuse    a. Quit 2012.   Hyperlipidemia    Hypertension    Moderate to severe mitral insufficiency    a. 10/2013 Echo: EF 55-60%, mod MR; b. 02/2016 Echo: EF 55-60%, mod to sev MR directed posteriorly--felt to be dynamic -worse with volume overload; c. 07/2017 Echo [facility]): EF >55%. Mod MR.   Myocardial infarct [facility]) 05/2013   Peripheral vascular disease ([place])    Sickle cell trait ([place])    Thyroid nodule    [place] 06/2017 due to f/u US in 1 year    Past Surgical History:  Procedure Laterality Date   A/V FISTULAGRAM Left 11/10/2017   Procedure: A/V FISTULAGRAM;  Surgeon: Katha Cabal, MD;  Location: [place] [facility];   Service: Cardiovascular;  Laterality: Left;   A/V SHUNTOGRAM Left 02/08/2019   Procedure: A/V SHUNTOGRAM;  Surgeon: Katha Cabal, MD;  Location: [place] [facility];  Service: Cardiovascular;  Laterality: Left;   ABDOMINAL HYSTERECTOMY     2000   CARDIAC CATHETERIZATION     CARDIAC CATHETERIZATION N/A 05/02/2015   Procedure: Left Heart Cath and Coronary Angiography;  Surgeon: [name], MD;  Location: [place] [facility];  Service: Cardiovascular;  Laterality: N/A;   COLONOSCOPY WITH PROPOFOL N/A 07/08/2016   Procedure: COLONOSCOPY WITH PROPOFOL;  Surgeon: [name], MD;  Location: [facility] ENDOSCOPY;  Service: Endoscopy;  Laterality: N/A;  COLONOSCOPY WITH PROPOFOL N/A 08/20/2017   Procedure: COLONOSCOPY WITH PROPOFOL;  Surgeon: [name], MD;  Location: [facility] ENDOSCOPY;  Service: Gastroenterology;  Laterality: N/A;   colonscopy     CORONARY ANGIOPLASTY  05/28/2014   stent placement to the mid RCA   CYSTOURETHROSCOPY     [facility] urology with stent placement 11 or 05/2019 and stent removal 06/13/19    DILATION AND CURETTAGE OF UTERUS     several in the early 80's   ESOPHAGOGASTRODUODENOSCOPY     2012   ESOPHAGOGASTRODUODENOSCOPY (EGD) WITH PROPOFOL N/A 03/11/2018   Procedure: ESOPHAGOGASTRODUODENOSCOPY (EGD) WITH PROPOFOL;  Surgeon: [name], MD;  Location[facility] ENDOSCOPY;  Service: Gastroenterology;  Laterality: N/A;   EYE SURGERY     bilateral laser 2012   EYE SURGERY     right   EYE SURGERY     x4 both eyes   GAS INSERTION  09/30/2011   Procedure: INSERTION OF GAS;  Surgeon: [name], MD;  Location: [place];  Service: Ophthalmology;  Laterality: Right;  [id number]   GAS/FLUID EXCHANGE  09/30/2011   Procedure: GAS/FLUID EXCHANGE;  Surgeon: [name], MD;  Location: [place];  Service: Ophthalmology;  Laterality: Right;   KIDNEY TRANSPLANT     03/30/19 Dr. Mickel Baas Thomas/Dr. Cristie Hem Zendel/Dr. Gilford Raid; [facility] transplant Dr. Horald Chestnut will be primary [phone] coordinator  Sarah Wynkoop [phone]   KIDNEY TRANSPLANT     03/30/19 [facility]   LEFT HEART CATHETERIZATION WITH CORONARY ANGIOGRAM N/A 05/28/2013   Procedure: LEFT HEART CATHETERIZATION WITH CORONARY ANGIOGRAM;  Surgeon: [name], MD;  Location: [facility] CATH LAB;  Service: Cardiovascular;  Laterality: N/A;   PARS PLANA VITRECTOMY  04/22/2011   Procedure: PARS PLANA VITRECTOMY WITH 25 GAUGE;  Surgeon: [name], MD;  Location: [place];  Service: Ophthalmology;  Laterality: Left;  membrane peel, endolaser, gas fluid exchange, silicone oil, repair of complex traction retinal detachment   PARS PLANA VITRECTOMY  09/30/2011   Procedure: PARS PLANA VITRECTOMY WITH 25 GAUGE;  Surgeon: [name], MD;  Location: [place];  Service: Ophthalmology;  Laterality: Right;  Endolaser; Repair of Complex Traction Retinal Detachment   PARS PLANA VITRECTOMY  02/24/2012   Procedure: PARS PLANA VITRECTOMY WITH 25 GAUGE;  Surgeon: [name], MD;  Location: [place];  Service: Ophthalmology;  Laterality: Left;   PTCA     SILICON OIL REMOVAL  09/04/9240   Procedure: SILICON OIL REMOVAL;  Surgeon: [name], MD;  Location: [place];  Service: Ophthalmology;  Laterality: Left;   THROMBECTOMY / ARTERIOVENOUS GRAFT REVISION     TUBAL LIGATION     1979   UPPER EXTREMITY ANGIOGRAPHY Left 02/08/2019   Procedure: UPPER EXTREMITY ANGIOGRAPHY;  Surgeon: Katha Cabal, MD;  Location: [place] [facility];  Service: Cardiovascular;  Laterality: Left;   Family History  Problem Relation Age of Onset   Stroke Mother    Heart attack Mother    Heart disease Mother    Colon cancer Father    Colon cancer Sister    Heart attack Brother    Stroke Brother    Diabetes Brother    Breast cancer Sister    Diabetes Brother    Diabetes Daughter    Renal Disease Daughter        on HD   Anesthesia problems Neg Hx    Hypotension Neg Hx    Malignant hyperthermia Neg Hx    Pseudochol deficiency Neg Hx  Social History    Socioeconomic History   Marital status: Single    Spouse name: Not on file   Number of children: 2   Years of education: Not on file   Highest education level: Not on file  Occupational History   Not on file  Tobacco Use   Smoking status: Former    Packs/day: 1.00    Years: 25.00    Pack years: 25.00    Types: Cigarettes    Quit date: 06/09/2010    Years since quitting: 10.4   Smokeless tobacco: Never  Vaping Use   Vaping Use: Never used  Substance and Sexual Activity   Alcohol use: No   Drug use: No   Sexual activity: Never  Other Topics Concern   Not on file  Social History Narrative   On disability.    Lives with son Brenton Grills   2 children (has son and daughter)      Son drives her since her vision has decreased   Social Determinants of Radio broadcast assistant Strain: Low Risk    Difficulty of Paying Living Expenses: Not hard at all  Food Insecurity: No Food Insecurity   Worried About Charity fundraiser in the Last Year: Never true   Arboriculturist in the Last Year: Never true  Transportation Needs: No Transportation Needs   Lack of Transportation (Medical): No   Lack of Transportation (Non-Medical): No  Physical Activity: Not on file  Stress: No Stress Concern Present   Feeling of Stress : Not at all  Social Connections: Unknown   Frequency of Communication with Friends and Family: More than three times a week   Frequency of Social Gatherings with Friends and Family: Not on file   Attends Religious Services: Not on Electrical engineer or Organizations: Not on file   Attends Archivist Meetings: Not on file   Marital Status: Not on file    Tobacco Counseling Counseling given: Not Answered   Clinical Intake:  Pre-visit preparation completed: Yes        Diabetes: Yes (Followed by pcp)  How often do you need to have someone help you when you read instructions, pamphlets, or other written materials from your doctor or  pharmacy?: 1 - Never   Interpreter Needed?: No      Activities of Daily Living In your present state of health, do you have any difficulty performing the following activities: 12/03/2020 05/29/2020  Hearing? N N  Vision? N N  Difficulty concentrating or making decisions? N N  Walking or climbing stairs? Y N  Comment Paces self. Chronic pain. Cane in use as needed. -  Dressing or bathing? N N  Doing errands, shopping? Y N  Comment Does not run errands alone Facilities manager and eating ? N -  Using the Toilet? N -  In the past six months, have you accidently leaked urine? N -  Do you have problems with loss of bowel control? N -  Managing your Medications? N -  Managing your Finances? N -  Housekeeping or managing your Housekeeping? N -  Some recent data might be hidden    Patient Care Team: McLean-Scocuzza, Nino Glow, MD as PCP - General (Internal Medicine) Minna Merritts, MD as PCP - Cardiology (Cardiology) Alisa Graff, FNP as Nurse Practitioner (Family Medicine) Minna Merritts, MD as Consulting Physician (Cardiology) Anthonette Legato, MD as Consulting Physician (Nephrology)  Indicate any  recent Medical Services you may have received from other than [facility] providers in the past year (date may be approximate).     Assessment:   This is a routine wellness examination for Hendel.  I connected with Neta today by telephone and verified that I am speaking with the correct person using two identifiers. Location patient: home Location provider: work Persons participating in the virtual visit: patient, Marine scientist.    I discussed the limitations, risks, security and privacy concerns of performing an evaluation and management service by telephone and the availability of in person appointments. The patient expressed understanding and verbally consented to this telephonic visit.    Interactive audio and video telecommunications were attempted between this provider and patient,  however failed, due to patient having technical difficulties OR patient did not have access to video capability.  We continued and completed visit with audio only.  Some vital signs may be absent or patient reported.   Hearing/Vision screen Hearing Screening - Comments:: Patient is able to hear conversational tones without difficulty.  No issues reported.   Vision Screening - Comments:: Cataract extraction, bilateral     Dietary issues and exercise activities discussed: Current Exercise Habits: Home exercise routine, Intensity: Mild Healthy diet Good water intake   Goals Addressed             This Visit's Progress    Healthy Lifestyle       Stay active Stay hydrated Healthy diet        Depression Screen PHQ 2/9 Scores 12/03/2020 12/01/2019 06/16/2019 03/23/2018 01/05/2018 12/29/2017 09/10/2017  PHQ - 2 Score 0 0 0 0 0 0 0    Fall Risk Fall Risk  12/03/2020 05/29/2020 02/24/2020 01/04/2020 12/01/2019  Falls in the past year? 1 0 1 1 1   Number falls in past yr: 0 0 1 1 1   Injury with Fall? 0 0 0 0 0  Risk for fall due to : Impaired balance/gait - Impaired balance/gait;Impaired mobility;History of fall(s) History of fall(s) Impaired balance/gait  Risk for fall due to: Comment - - - - Cane in use. Notes last fall several months ago.  Follow up Falls evaluation completed Falls evaluation completed Falls evaluation completed Falls evaluation completed Falls evaluation completed    FALL RISK PREVENTION PERTAINING TO THE HOME: Handrails in use when climbing stairs? Yes Home free of loose throw rugs in walkways, pet beds, electrical cords, etc? Yes  Adequate lighting in your home to reduce risk of falls? Yes   ASSISTIVE DEVICES UTILIZED TO PREVENT FALLS: Use of a cane, walker or w/c? Yes , cane as needed  TIMED UP AND GO: Was the test performed? No .   Cognitive Function:     6CIT Screen 12/03/2020 12/01/2019 03/04/2018 02/24/2017  What Year? 0 points 0 points 0 points 0 points   What month? 0 points 0 points 0 points 0 points  What time? 0 points - 0 points 0 points  Count back from 20 0 points - 0 points 0 points  Months in reverse 0 points 0 points 0 points 0 points  Repeat phrase - 0 points 0 points 0 points  Total Score - - 0 0    Immunizations Immunization History  Administered Date(s) Administered   Hepatitis B 12/16/2012, 01/15/2013, 03/05/2013, 06/18/2013   Hepatitis B, adult 12/16/2012, 01/15/2013, 03/05/2013, 06/18/2013   Influenza Nasal 03/06/2015   Influenza Split 02/25/2012, 02/07/2013, 02/19/2015, 03/06/2015   Influenza, High Dose Seasonal PF 02/09/2018   Influenza,inj,Quad PF,6+ Mos 02/21/2014, 02/24/2017,  04/04/2020   Influenza-Unspecified 02/25/2012, 03/01/2018, 03/18/2019, 04/04/2020   PFIZER(Purple Top)SARS-COV-2 Vaccination 02/11/2020, 03/03/2020, 09/12/2020   PPD Test 07/16/2012, 11/12/2012, 01/17/2015, 05/12/2016, 05/13/2017, 05/12/2018   Pneumococcal Conjugate-13 05/15/2020   Pneumococcal Polysaccharide-23 06/09/2010, 03/23/2018   Pneumococcal-Unspecified 06/09/2010, 07/04/2015, 03/23/2018   Tetanus 06/09/2010   Health Maintenance Health Maintenance  Topic Date Due   HEMOGLOBIN A1C  10/16/2020   Zoster Vaccines- Shingrix (1 of 2) 03/05/2021 (Originally [birth year]1976)   Pneumococcal Vaccine 40-47 Years old (1 - PCV) 12/03/2021 (Originally 08/03/1961)   COLONOSCOPY (Pts 45-2yrs Insurance coverage will need to be confirmed)  12/03/2021 (Originally 08/20/2020)   TETANUS/TDAP  12/03/2021 (Originally 06/09/2020)   OPHTHALMOLOGY EXAM  12/04/2020   COVID-19 Vaccine (4 - Booster for Pfizer series) 12/12/2020   INFLUENZA VACCINE  01/07/2021   FOOT EXAM  11/12/2021   MAMMOGRAM  03/15/2022   PNEUMOCOCCAL POLYSACCHARIDE VACCINE AGE 75-64 HIGH RISK  Completed   Hepatitis C Screening  Completed   HIV Screening  Completed   HPV VACCINES  Aged Out   PAP SMEAR-Modifier  Discontinued   Colonoscopy- deferred per patient. Notes in the process of  completing other appointments with plans to follow up.  CT Chest WO Contrast- Completed 04/18/20.  Dental Screening: Recommended annual dental exams for proper oral hygiene.  Community Resource Referral / Chronic Care Management: CRR required this visit?  No   CCM required this visit?  No      Plan:   Keep all routine maintenance appointments.   I have personally reviewed and noted the following in the patient's chart:   Medical and social history Use of alcohol, tobacco or illicit drugs  Current medications and supplements including opioid prescriptions. Patient not currently taking opioid.  Functional ability and status Nutritional status Physical activity Advanced directives List of other physicians Hospitalizations, surgeries, and ER visits in previous 12 months Vitals Screenings to include cognitive, depression, and falls Referrals and appointments  In addition, I have reviewed and discussed with patient certain preventive protocols, quality metrics, and best practice recommendations. A written personalized care plan for preventive services as well as general preventive health recommendations were provided to patient.     Varney Biles, LPN   [id number]

## 2020-12-04 ENCOUNTER — Encounter: Payer: Self-pay | Admitting: Internal Medicine

## 2020-12-04 ENCOUNTER — Telehealth: Payer: Self-pay | Admitting: Internal Medicine

## 2020-12-04 ENCOUNTER — Other Ambulatory Visit: Payer: Self-pay

## 2020-12-04 ENCOUNTER — Ambulatory Visit (INDEPENDENT_AMBULATORY_CARE_PROVIDER_SITE_OTHER): Payer: Medicare Other | Admitting: Internal Medicine

## 2020-12-04 VITALS — BP 120/60 | HR 64 | Temp 98.1°F | Ht 67.0 in | Wt 155.8 lb

## 2020-12-04 DIAGNOSIS — R921 Mammographic calcification found on diagnostic imaging of breast: Secondary | ICD-10-CM

## 2020-12-04 DIAGNOSIS — E785 Hyperlipidemia, unspecified: Secondary | ICD-10-CM | POA: Diagnosis not present

## 2020-12-04 DIAGNOSIS — Z1231 Encounter for screening mammogram for malignant neoplasm of breast: Secondary | ICD-10-CM

## 2020-12-04 DIAGNOSIS — R918 Other nonspecific abnormal finding of lung field: Secondary | ICD-10-CM | POA: Insufficient documentation

## 2020-12-04 DIAGNOSIS — I38 Endocarditis, valve unspecified: Secondary | ICD-10-CM | POA: Diagnosis not present

## 2020-12-04 DIAGNOSIS — M545 Low back pain, unspecified: Secondary | ICD-10-CM

## 2020-12-04 DIAGNOSIS — I10 Essential (primary) hypertension: Secondary | ICD-10-CM

## 2020-12-04 DIAGNOSIS — G894 Chronic pain syndrome: Secondary | ICD-10-CM | POA: Diagnosis not present

## 2020-12-04 DIAGNOSIS — I2510 Atherosclerotic heart disease of native coronary artery without angina pectoris: Secondary | ICD-10-CM

## 2020-12-04 DIAGNOSIS — E559 Vitamin D deficiency, unspecified: Secondary | ICD-10-CM | POA: Diagnosis not present

## 2020-12-04 DIAGNOSIS — K257 Chronic gastric ulcer without hemorrhage or perforation: Secondary | ICD-10-CM

## 2020-12-04 DIAGNOSIS — N1832 Chronic kidney disease, stage 3b: Secondary | ICD-10-CM

## 2020-12-04 DIAGNOSIS — E041 Nontoxic single thyroid nodule: Secondary | ICD-10-CM | POA: Diagnosis not present

## 2020-12-04 DIAGNOSIS — D696 Thrombocytopenia, unspecified: Secondary | ICD-10-CM | POA: Insufficient documentation

## 2020-12-04 DIAGNOSIS — G8929 Other chronic pain: Secondary | ICD-10-CM

## 2020-12-04 DIAGNOSIS — J449 Chronic obstructive pulmonary disease, unspecified: Secondary | ICD-10-CM | POA: Diagnosis not present

## 2020-12-04 DIAGNOSIS — I6521 Occlusion and stenosis of right carotid artery: Secondary | ICD-10-CM

## 2020-12-04 DIAGNOSIS — E1142 Type 2 diabetes mellitus with diabetic polyneuropathy: Secondary | ICD-10-CM | POA: Diagnosis not present

## 2020-12-04 DIAGNOSIS — Z1211 Encounter for screening for malignant neoplasm of colon: Secondary | ICD-10-CM | POA: Diagnosis not present

## 2020-12-04 DIAGNOSIS — R1031 Right lower quadrant pain: Secondary | ICD-10-CM

## 2020-12-04 DIAGNOSIS — G4733 Obstructive sleep apnea (adult) (pediatric): Secondary | ICD-10-CM

## 2020-12-04 DIAGNOSIS — Z940 Kidney transplant status: Secondary | ICD-10-CM

## 2020-12-04 DIAGNOSIS — R011 Cardiac murmur, unspecified: Secondary | ICD-10-CM

## 2020-12-04 DIAGNOSIS — R21 Rash and other nonspecific skin eruption: Secondary | ICD-10-CM

## 2020-12-04 LAB — VITAMIN D 25 HYDROXY (VIT D DEFICIENCY, FRACTURES): VITD: 56.89 ng/mL (ref 30.00–100.00)

## 2020-12-04 LAB — HEPATIC FUNCTION PANEL
ALT: 8 U/L (ref 0–35)
AST: 12 U/L (ref 0–37)
Albumin: 4.1 g/dL (ref 3.5–5.2)
Alkaline Phosphatase: 135 U/L — ABNORMAL HIGH (ref 39–117)
Bilirubin, Direct: 0.1 mg/dL (ref 0.0–0.3)
Total Bilirubin: 0.4 mg/dL (ref 0.2–1.2)
Total Protein: 6.5 g/dL (ref 6.0–8.3)

## 2020-12-04 LAB — TSH: TSH: 0.96 u[IU]/mL (ref 0.35–4.50)

## 2020-12-04 MED ORDER — OXYCODONE-ACETAMINOPHEN 5-325 MG PO TABS: 1.0000 | ORAL_TABLET | Freq: Two times a day (BID) | ORAL | 0 refills | Status: DC | PRN

## 2020-12-04 MED ORDER — ALBUTEROL SULFATE (2.5 MG/3ML) 0.083% IN NEBU: 2.5000 mg | INHALATION_SOLUTION | Freq: Four times a day (QID) | RESPIRATORY_TRACT | 11 refills | Status: DC | PRN

## 2020-12-04 MED ORDER — PREGABALIN 75 MG PO CAPS: 75.0000 mg | ORAL_CAPSULE | Freq: Two times a day (BID) | ORAL | 5 refills | Status: DC

## 2020-12-04 MED ORDER — ALBUTEROL SULFATE HFA 108 (90 BASE) MCG/ACT IN AERS: 1.0000 | INHALATION_SPRAY | Freq: Four times a day (QID) | RESPIRATORY_TRACT | 12 refills | Status: DC | PRN

## 2020-12-04 NOTE — Progress Notes (Signed)
Chief Complaint  Patient presents with   Medication Management   Skin Problem    Abdomen    F/u  1. H/o gastric ulcer and due for colonoscopy referred back to Dr. [name] for f/u  2. S/p renal transplant on tacrolimus er 8 mg qd dose recently changed [facility] transplant overall doing well but has rash to abdomen dark hyperpigmented lesions w/o sx's refer [facility] dermatology renal transplant appt per pt in 12/2020 or 02/2021 and has monthly labs last labs A1C 5.0 09/12/20 11/20/20 plts 142 (reviewed could be side effect of tacrolimus ER 8 mg) and Cr increased to 1.68 and GFR reduced from 50s to 40s to 34  3. Chronic back pain and neuropathy lyrica 75 mg bid is not helping  4. Thyroid US f/u had 02/01/20 [facility] check thyroid labs today Additional similar solid cystic or cystic nodules identified, without suspicious features.   Incidentally noted nonocclusive atherosclerotic plaque noted in the right common carotid artery.   IMPRESSION:  No suspicious thyroid nodules identified.  5. Copd needs new neb machine pt signed for this today and rx neb solution and prn albuterol inhaler  6. Htn controlled on norvasc 10 mg qd coreg 12.5 mg bid not taking lasix 20 mg but prn not taking clonidine 0.1, CAD/CAS b/l noted 02/01/20 thyroid US right nonocclusive on lipitor 20 mg qhs  7. OSA not using CPAP and turned into [facility] in [place] she mentions they keep charging her for this and she has called 2x  8. Abnormal mammogram due 6 month dx mammogram 03/12/20  IMPRESSION: 0.4 cm group of likely benign INNER LEFT breast calcifications six-month follow-up recommended to ensure stability.   RECOMMENDATION: LEFT diagnostic mammogram with magnification views in 6 months.   I have discussed the findings and recommendations with the patient. If applicable, a reminder letter will be sent to the patient regarding the next appointment.   BI-RADS CATEGORY  3: Probably benign.   9. C/o RLQ ab pain s/p renal transplant  x 1 week  ab pain RLQ h/o kidney transplant. Nothing tried    Review of Systems  Constitutional:  Negative for weight loss.  HENT:  Negative for hearing loss.   Eyes:  Negative for blurred vision.  Respiratory:  Negative for shortness of breath.   Cardiovascular:  Negative for chest pain.  Gastrointestinal:  Positive for abdominal pain.  Musculoskeletal:  Positive for back pain.  Skin:  Negative for rash.  Neurological:  Positive for sensory change.  Psychiatric/Behavioral:  Negative for depression.   Past Medical History:  Diagnosis Date   Anemia of chronic disease    Anginal pain ([place])    Carotid arterial disease ([place])    a. 02/2013 U/S: 40-59% bilat ICA stenosis.   Chronic constipation    Chronic diastolic CHF (congestive heart failure) ([place])    a. 10/2013 Echo [facility]): EF 55-60%, mod conc LVH, mod MR, mildly dil LA, mild Ao sclerosis w/o stenosis; b. 02/2016 Echo: EF 55-60%, no rwma, Gr DD, mild AS, mod to sev MR, mildly dil LA, PASP 5mmHg; c. 07/2017 Echo [facility]): EF >55%, mild to mod LVH, Gr2 DD, Ao scl, Mod MR, mod dil LA, nl RV fxn.   Colon polyps    COPD (chronic obstructive pulmonary disease) (HCC)    Coronary artery disease    a. 05/2013 NSTEMI/PCI: LM 20d, LAD min irregs, LCX small, nl, OM1 nl, RCA dom 62m (2.5x16 Promus DES), PDA1 80p; b. 04/2015 Cath: LM nl, LAD 51m, D1/2 min  irregs, LCX 35p/m, OM2/3 min irregs, RCA patent mid stent, RPDA 70ost, RPLB1 30, RPLB2/3 min irregs, EF 55-65%--> Med Rx; c. 06/2017 [facility]): No ischemia/infarct, EF 64%.   Diabetes mellitus    Diabetic neuropathy ([place])    Diabetic retinopathy ([place]) 05/28/2013   Hx bilat retinal detachment, proliferative diab retinopathy and bilat vitreous hemorrhage    Emphysema    ESRD on hemodialysis ([place])    a. [facility] in [place], [place] ([phone]/Dr. [name], on a MWF schedule.  She started dialysis in Feb 2014.  Etiology of renal failure not known, likely diabetes.  Has a left upper arm AV graft.   History of bronchitis     Mar 2012   History of pneumonia    June 2012   History of tobacco abuse    a. Quit 2012.   Hyperlipidemia    Hypertension    Moderate to severe mitral insufficiency    a. 10/2013 Echo: EF 55-60%, mod MR; b. 02/2016 Echo: EF 55-60%, mod to sev MR directed posteriorly--felt to be dynamic -worse with volume overload; c. 07/2017 Echo [facility]): EF >55%. Mod MR.   Myocardial infarct [facility]) 05/2013   Peripheral vascular disease ([place])    Sickle cell trait ([place])    Thyroid nodule    [place] 06/2017 due to f/u US in 1 year    Past Surgical History:  Procedure Laterality Date   A/V FISTULAGRAM Left 11/10/2017   Procedure: A/V FISTULAGRAM;  Surgeon: Katha Cabal, MD;  Location: [place] [facility];  Service: Cardiovascular;  Laterality: Left;   A/V SHUNTOGRAM Left 02/08/2019   Procedure: A/V SHUNTOGRAM;  Surgeon: Katha Cabal, MD;  Location: [place] [facility];  Service: Cardiovascular;  Laterality: Left;   ABDOMINAL HYSTERECTOMY     2000   CARDIAC CATHETERIZATION     CARDIAC CATHETERIZATION N/A 05/02/2015   Procedure: Left Heart Cath and Coronary Angiography;  Surgeon: [name], MD;  Location: [place] [facility];  Service: Cardiovascular;  Laterality: N/A;   COLONOSCOPY WITH PROPOFOL N/A 07/08/2016   Procedure: COLONOSCOPY WITH PROPOFOL;  Surgeon: [name], MD;  Location: [facility] ENDOSCOPY;  Service: Endoscopy;  Laterality: N/A;   COLONOSCOPY WITH PROPOFOL N/A 08/20/2017   Procedure: COLONOSCOPY WITH PROPOFOL;  Surgeon: [name], MD;  Location: [facility] ENDOSCOPY;  Service: Gastroenterology;  Laterality: N/A;   colonscopy     CORONARY ANGIOPLASTY  05/28/2014   stent placement to the mid RCA   CYSTOURETHROSCOPY     [facility] urology with stent placement 11 or 05/2019 and stent removal 06/13/19    DILATION AND CURETTAGE OF UTERUS     several in the early 80's   ESOPHAGOGASTRODUODENOSCOPY     2012   ESOPHAGOGASTRODUODENOSCOPY (EGD) WITH PROPOFOL N/A 03/11/2018   Procedure:  ESOPHAGOGASTRODUODENOSCOPY (EGD) WITH PROPOFOL;  Surgeon: [name], MD;  Location[facility] ENDOSCOPY;  Service: Gastroenterology;  Laterality: N/A;   EYE SURGERY     bilateral laser 2012   EYE SURGERY     right   EYE SURGERY     x4 both eyes   GAS INSERTION  09/30/2011   Procedure: INSERTION OF GAS;  Surgeon: [name], MD;  Location: [place];  Service: Ophthalmology;  Laterality: Right;  [id number]   GAS/FLUID EXCHANGE  09/30/2011   Procedure: GAS/FLUID EXCHANGE;  Surgeon: [name], MD;  Location: [place];  Service: Ophthalmology;  Laterality: Right;   KIDNEY TRANSPLANT     03/30/19 Dr. Mickel Baas Thomas/Dr. Cristie Hem Zendel/Dr. Gilford Raid; [facility]  transplant Dr. Horald Chestnut will be primary [phone] coordinator Sarah Wynkoop [phone]   KIDNEY TRANSPLANT     03/30/19 [facility]   LEFT HEART CATHETERIZATION WITH CORONARY ANGIOGRAM N/A 05/28/2013   Procedure: LEFT HEART CATHETERIZATION WITH CORONARY ANGIOGRAM;  Surgeon: [name], MD;  Location: [facility] CATH LAB;  Service: Cardiovascular;  Laterality: N/A;   PARS PLANA VITRECTOMY  04/22/2011   Procedure: PARS PLANA VITRECTOMY WITH 25 GAUGE;  Surgeon: [name], MD;  Location: [place];  Service: Ophthalmology;  Laterality: Left;  membrane peel, endolaser, gas fluid exchange, silicone oil, repair of complex traction retinal detachment   PARS PLANA VITRECTOMY  09/30/2011   Procedure: PARS PLANA VITRECTOMY WITH 25 GAUGE;  Surgeon: [name], MD;  Location: [place];  Service: Ophthalmology;  Laterality: Right;  Endolaser; Repair of Complex Traction Retinal Detachment   PARS PLANA VITRECTOMY  02/24/2012   Procedure: PARS PLANA VITRECTOMY WITH 25 GAUGE;  Surgeon: [name], MD;  Location: [place];  Service: Ophthalmology;  Laterality: Left;   PTCA     SILICON OIL REMOVAL  9/51/8841   Procedure: SILICON OIL REMOVAL;  Surgeon: [name], MD;  Location: [place];  Service: Ophthalmology;  Laterality: Left;   THROMBECTOMY / ARTERIOVENOUS GRAFT  REVISION     TUBAL LIGATION     1979   UPPER EXTREMITY ANGIOGRAPHY Left 02/08/2019   Procedure: UPPER EXTREMITY ANGIOGRAPHY;  Surgeon: Katha Cabal, MD;  Location: [place] [facility];  Service: Cardiovascular;  Laterality: Left;   Family History  Problem Relation Age of Onset   Stroke Mother    Heart attack Mother    Heart disease Mother    Colon cancer Father    Colon cancer Sister    Breast cancer Sister    Heart attack Brother    Stroke Brother    Diabetes Brother    Diabetes Brother    Diabetes Daughter    Renal Disease Daughter        on HD   Kidney failure Daughter        age 61 as of 12/03/20   Anesthesia problems Neg Hx    Hypotension Neg Hx    Malignant hyperthermia Neg Hx    Pseudochol deficiency Neg Hx    Social History   Socioeconomic History   Marital status: Single    Spouse name: Not on file   Number of children: 2   Years of education: Not on file   Highest education level: Not on file  Occupational History   Not on file  Tobacco Use   Smoking status: Former    Packs/day: 1.00    Years: 25.00    Pack years: 25.00    Types: Cigarettes    Quit date: 06/09/2010    Years since quitting: 10.4   Smokeless tobacco: Never  Vaping Use   Vaping Use: Never used  Substance and Sexual Activity   Alcohol use: No   Drug use: No   Sexual activity: Never  Other Topics Concern   Not on file  Social History Narrative   On disability.    Lives with son Brenton Grills   2 children (has son and daughter)      Son drives her since her vision has decreased   Social Determinants of Radio broadcast assistant Strain: Low Risk    Difficulty of Paying Living Expenses: Not hard at all  Food Insecurity: No Food Insecurity   Worried  About Running Out of Food in the Last Year: Never true   Ran Out of Food in the Last Year: Never true  Transportation Needs: No Transportation Needs   Lack of Transportation (Medical): No   Lack of Transportation (Non-Medical): No   Physical Activity: Not on file  Stress: No Stress Concern Present   Feeling of Stress : Not at all  Social Connections: Unknown   Frequency of Communication with Friends and Family: More than three times a week   Frequency of Social Gatherings with Friends and Family: Not on file   Attends Religious Services: Not on Electrical engineer or Organizations: Not on file   Attends Archivist Meetings: Not on file   Marital Status: Not on file  Intimate Partner Violence: Not At Risk   Fear of Current or Ex-Partner: No   Emotionally Abused: No   Physically Abused: No   Sexually Abused: No   Current Meds  Medication Sig   acetaminophen (TYLENOL) 500 MG tablet Take 1,000 mg by mouth daily as needed.   aspirin EC 81 MG tablet Take 1 tablet (81 mg total) by mouth at bedtime.   atorvastatin (LIPITOR) 20 MG tablet Take 20 mg by mouth daily.   carvedilol (COREG) 12.5 MG tablet Take 1 tablet (12.5 mg total) by mouth 2 (two) times daily with a meal.   esomeprazole (NEXIUM) 20 MG capsule Take 20 mg by mouth daily.   magnesium oxide (MAG-OX) 400 MG tablet Take 400 mg by mouth 2 (two) times daily.   nitroGLYCERIN (NITROSTAT) 0.4 MG SL tablet Place 1 tablet (0.4 mg total) under the tongue every 5 (five) minutes as needed for chest pain.   oxyCODONE-acetaminophen (PERCOCET/ROXICET) 5-325 MG tablet Take 1 tablet by mouth 2 (two) times daily as needed for severe pain.   sodium bicarbonate 650 MG tablet Take 650 mg by mouth 2 (two) times daily.   Tacrolimus ER 4 MG TB24 Take 8 mg by mouth every morning.   [DISCONTINUED] albuterol (VENTOLIN HFA) 108 (90 Base) MCG/ACT inhaler Inhale 1-2 puffs into the lungs every 6 (six) hours as needed for wheezing or shortness of breath.   [DISCONTINUED] pregabalin (LYRICA) 75 MG capsule Take 1 capsule (75 mg total) by mouth 2 (two) times daily.   Allergies  Allergen Reactions   Hydrocodone Hives   Lisinopril     Unknown reaction    Recent Results  (from the past 2160 hour(s))  TSH     Status: None   Collection Time: 12/04/20 11:38 AM  Result Value Ref Range   TSH 0.96 0.35 - 4.50 uIU/mL  Hepatic function panel     Status: Abnormal   Collection Time: 12/04/20 11:38 AM  Result Value Ref Range   Total Bilirubin 0.4 0.2 - 1.2 mg/dL   Bilirubin, Direct 0.1 0.0 - 0.3 mg/dL   Alkaline Phosphatase 135 (H) 39 - 117 U/L   AST 12 0 - 37 U/L   ALT 8 0 - 35 U/L   Total Protein 6.5 6.0 - 8.3 g/dL   Albumin 4.1 3.5 - 5.2 g/dL  Vitamin D (25 hydroxy)     Status: None   Collection Time: 12/04/20 11:38 AM  Result Value Ref Range   VITD 56.89 30.00 - 100.00 ng/mL   Objective  Body mass index is 24.4 kg/m. Wt Readings from Last 3 Encounters:  12/04/20 155 lb 12.8 oz (70.7 kg)  12/03/20 154 lb (69.9 kg)  11/22/20 154 lb 12.8  oz (70.2 kg)   Temp Readings from Last 3 Encounters:  12/04/20 98.1 F (36.7 C) (Oral)  04/21/20 97.6 F (36.4 C) (Oral)  02/24/20 97.9 F (36.6 C) (Oral)   BP Readings from Last 3 Encounters:  12/04/20 120/60  11/22/20 (!) 152/60  05/29/20 (!) 138/50   Pulse Readings from Last 3 Encounters:  12/04/20 64  11/22/20 73  05/29/20 71    Physical Exam Vitals and nursing note reviewed.  Constitutional:      Appearance: Normal appearance. She is well-developed and well-groomed.  HENT:     Head: Normocephalic and atraumatic.  Eyes:     Conjunctiva/sclera: Conjunctivae normal.     Pupils: Pupils are equal, round, and reactive to light.  Cardiovascular:     Rate and Rhythm: Normal rate and regular rhythm.     Heart sounds: Murmur heard.  Pulmonary:     Effort: Pulmonary effort is normal.     Breath sounds: Normal breath sounds.  Abdominal:    Skin:    General: Skin is warm and dry.  Neurological:     General: No focal deficit present.     Mental Status: She is alert and oriented to person, place, and time. Mental status is at baseline.     Gait: Gait normal.  Psychiatric:        Attention and  Perception: Attention and perception normal.        Mood and Affect: Mood and affect normal.        Speech: Speech normal.        Behavior: Behavior normal. Behavior is cooperative.        Thought Content: Thought content normal.        Cognition and Memory: Cognition and memory normal.        Judgment: Judgment normal.    Assessment  Plan  Valvular heart disease Htn controlled  -->? Med complaince and what meds she should be taking will let cards address 12/02/20 along with renal transplant team [facility] for htn and cv standpoint she states she needs refills  Cc'ed Dr. Rockey Situ appt 12/05/20  Hi pt has appt 12/05/20 with you needs refills on all cards meds you Rx and I at times ? Compliance with some BP meds as she told CMA not had in days some I.e norvasc and needed refills  -can you address compliance and refill appropriated meds cards/htn standpoint please   Also had labs 09/12/20 lipid panel unc epic and 11/20/20 other labs  Htn meds per cards and unc renal/transplant Take care      Hld labs done 09/12/20 unc ldl not at goal  stenosis of right carotid artery and left Coronary artery disease involving native coronary artery of native heart without angina pectoris Heart murmur echo 09/12/20 care everywhere   Summary    1. The left ventricle is normal in size with mildly increased wall  thickness.    2. The left ventricular systolic function is normal, LVEF is visually  estimated at 60-65%.    3. Mitral annular calcification is present (mild).    4. There is moderate mitral valve regurgitation.    5. The left atrium is moderately to severely dilated in size.    6. The aortic valve is trileaflet with mildly thickened leaflets with normal  excursion.    7. There is mild to moderate aortic regurgitation.    8. The right ventricle is normal in size, with normal systolic function.   Osa not on cpap she  turned in machine to [facility] but they are still charging her      Chronic obstructive  pulmonary disease, unspecified COPD type ([place]) - Plan: albuterol (VENTOLIN HFA) 108 (90 Base) MCG/ACT inhaler, albuterol (PROVENTIL) (2.5 MG/3ML) 0.083% nebulizer solution Signed for new neb machine today  Rec smoking cessation   Chronic gastric ulcer, unspecified whether gastric ulcer hemorrhage or perforation present - Plan: Ambulatory referral to Gastroenterology Encounter for screening colonoscopy - Plan: Ambulatory referral to Gastroenterology  Chronic pain syndrome Neck, low back, right hip not surgical candidate and lyrica 75 mg bid alone is not helping- Plan: oxyCODONE-acetaminophen (PERCOCET/ROXICET) 5-325 MG tablet bid prn #60 no refills  Diabetic polyneuropathy associated with type 2 diabetes mellitus ([place]) - Plan: pregabalin (LYRICA) 75 MG capsule, oxyCODONE-acetaminophen (PERCOCET/ROXICET) 5-325 MG tablet Chronic low back pain, unspecified back pain laterality, unspecified whether sciatica present - Plan: pregabalin (LYRICA) 75 MG capsule, oxyCODONE-acetaminophen (PERCOCET/ROXICET) 5-325 MG tablet bid prn  12/04/20 signed pain contract    Thyroid nodule - Plan: TSH Thyroid US had 02/01/20 see hpi  Vitamin D deficiency - Plan: Vitamin D (25 hydroxy)  Right lower quadrant abdominal pain - Plan: CT ABDOMEN PELVIS WO CONTRAST, Urinalysis, Routine w reflex microscopic, Urine Culture  Skin rash - Plan: Ambulatory referral to Dermatology [facility] Kidney transplant status - Plan: Ambulatory referral to Dermatology  Lung nodules benign  Ct chest 09/12/20  1. Stable pulmonary nodules compared to the 05/02/2019 chest CT.  As such, the nodules very likely relate to benign change. If the patient qualifies, consider participation in an annual lung cancer screening program. Otherwise, no imaging surveillance is recommended for this particular issue.   2. Notable incidental findings include:  -A heavy burden of calcified atherosclerotic disease within the coronary arterial distribution  -Similar  asymmetric soft tissue in the upper outer quadrant of theright breast. Consider correlation with physical exam and mammography.   Thrombocytopenia ([place]) Could be due to tacrolimus monitor last labs 11/20/20 [facility]   Stage 3b chronic kidney disease ([place]) s/p renal transplant  F/u unc transplant   OSA (obstructive sleep apnea) not on cpap see HPI  Breast calcification, left - Plan: MM DIAG BREAST TOMO UNI LEFT 6 month f/u due now  Screening mammogram for breast cancer - Plan: MM 3D SCREEN BREAST BILATERAL   HM Flu shot utd pna 23 utd  prevnar utd covid pfizer 3/3 last 09/2020    Tetanus UTD 06/09/10 consider in future update this Hep B immune had engerix as well 4 doses 2014-2015  Consider shingrix in future disc prev pt unsure if insurance will cover it  HCV neg 04/25/19 Hep B sAb 98.50 03/28/19 HIV neg 03/28/19   Labs reviewed this visit 11/20/20  unc labs   Pap neg 11/13/16 neg HPV s/p hysterectomy  -will ask in future if h/o abnormal pap if so will do pap if not d/c paps    mammo neg sch 03/12/20 left breast ca f/u due and screening in 03/2021   h/o multiple polyps colonoscopy 08/20/17 repeat in 3 years [facility] or [facility] can do this  -EGD 03/11/18 neg gastritis  -EGD ulcers 04/2019 neg HSV/H pylori unc gi egd  08/25/17 several polyps due 08/25/20 tubular adenoma  Referred Dr. [name]    Former smoker x 25-30 years <1 ppd  CTA chest 01/12/18 moderate b/l pleural effusions and CAD and aortic atherosclerosis, no nodules   ct chest had 09/12/20 [facility]   Thyroid nodules/parathyroid mass   [place] 06/16/17 right upper  thyroid nodule ? Parathyroid mass 1.1 cm and left mid nodule f/u annually last 02/01/20    Thyroid US 02/01/20 [facility]  Additional similar solid cystic or cystic nodules identified, without suspicious features.  Incidentally noted nonocclusive atherosclerotic plaque noted in the right common carotid artery.  IMPRESSION:  No suspicious thyroid nodules identified.      GI appt [facility]  06/20/19 f/u esophageal ulcers Dr. [name] Cards Dr. Rockey Situ Renal [facility] renal and transplant s/p kidney transplant 03/28/20     Provider: Dr. Olivia Mackie McLean-Scocuzza-Internal Medicine

## 2020-12-04 NOTE — Telephone Encounter (Signed)
Lft pt vm to call ofc to sch CT abdomen.thanks 

## 2020-12-04 NOTE — Telephone Encounter (Signed)
Please fax or call  [facility] [place] re this pt turned in cpap/bipap machine but still being billed can they contact her she is not using this for OSA   Thanks  Nowthen

## 2020-12-05 ENCOUNTER — Encounter: Payer: Self-pay | Admitting: *Deleted

## 2020-12-05 ENCOUNTER — Ambulatory Visit: Payer: Medicare Other | Admitting: Cardiovascular Disease

## 2020-12-05 ENCOUNTER — Telehealth: Payer: Self-pay | Admitting: Internal Medicine

## 2020-12-05 DIAGNOSIS — L989 Disorder of the skin and subcutaneous tissue, unspecified: Principal | ICD-10-CM

## 2020-12-05 NOTE — Telephone Encounter (Signed)
Lft pt vm to call ofc to sch CT abdomen.thanks 

## 2020-12-06 NOTE — Telephone Encounter (Signed)
PT call to return phone call missed

## 2020-12-06 NOTE — Progress Notes (Signed)
Left message to return call 

## 2020-12-07 ENCOUNTER — Other Ambulatory Visit: Payer: Self-pay

## 2020-12-07 ENCOUNTER — Encounter: Payer: Self-pay | Admitting: Physician Assistant

## 2020-12-07 ENCOUNTER — Ambulatory Visit (INDEPENDENT_AMBULATORY_CARE_PROVIDER_SITE_OTHER): Payer: Medicare Other | Admitting: Physician Assistant

## 2020-12-07 VITALS — BP 146/60 | HR 65 | Ht 67.0 in | Wt 156.0 lb

## 2020-12-07 DIAGNOSIS — I10 Essential (primary) hypertension: Secondary | ICD-10-CM

## 2020-12-07 DIAGNOSIS — E785 Hyperlipidemia, unspecified: Secondary | ICD-10-CM

## 2020-12-07 DIAGNOSIS — I340 Nonrheumatic mitral (valve) insufficiency: Secondary | ICD-10-CM

## 2020-12-07 DIAGNOSIS — N183 Chronic kidney disease, stage 3 unspecified: Secondary | ICD-10-CM

## 2020-12-07 DIAGNOSIS — I25118 Atherosclerotic heart disease of native coronary artery with other forms of angina pectoris: Secondary | ICD-10-CM | POA: Diagnosis not present

## 2020-12-07 DIAGNOSIS — I739 Peripheral vascular disease, unspecified: Secondary | ICD-10-CM

## 2020-12-07 DIAGNOSIS — E1122 Type 2 diabetes mellitus with diabetic chronic kidney disease: Secondary | ICD-10-CM

## 2020-12-07 DIAGNOSIS — I5032 Chronic diastolic (congestive) heart failure: Secondary | ICD-10-CM

## 2020-12-07 DIAGNOSIS — Z940 Kidney transplant status: Secondary | ICD-10-CM | POA: Diagnosis not present

## 2020-12-07 DIAGNOSIS — I6523 Occlusion and stenosis of bilateral carotid arteries: Secondary | ICD-10-CM | POA: Diagnosis not present

## 2020-12-07 DIAGNOSIS — Z794 Long term (current) use of insulin: Secondary | ICD-10-CM | POA: Diagnosis not present

## 2020-12-07 MED ORDER — HYDRALAZINE HCL 10 MG PO TABS: 25.0000 mg | ORAL_TABLET | Freq: Once | ORAL | Status: DC

## 2020-12-07 MED ORDER — CLONIDINE HCL 0.1 MG PO TABS
0.1000 mg | ORAL_TABLET | Freq: Once | ORAL | Status: AC
  Administered 2020-12-07: 0.1 mg via ORAL

## 2020-12-07 MED ORDER — CARVEDILOL 6.25 MG PO TABS: 6.2500 mg | ORAL_TABLET | Freq: Two times a day (BID) | ORAL | 11 refills | Status: DC

## 2020-12-07 MED ORDER — CLONIDINE HCL 0.1 MG PO TABS: 0.1000 mg | ORAL_TABLET | Freq: Two times a day (BID) | ORAL | 11 refills | Status: DC

## 2020-12-07 NOTE — Patient Instructions (Signed)
Medication Instructions:  START Coreg 6.25 mg TWO times a day  Clonidine 0.1 mg TWO times a day  *If you need a refill on your cardiac medications before your next appointment, please call your pharmacy*  Lab Work: None  Testing/Procedures: None  Follow-Up: At [facility], you and your health needs are our priority.  As part of our continuing mission to provide you with exceptional heart care, we have created designated Provider Care Teams.  These Care Teams include your primary Cardiologist (physician) and Advanced Practice Providers (APPs -  Physician Assistants and Nurse Practitioners) who all work together to provide you with the care you need, when you need it.  We recommend signing up for the patient portal called "MyChart".  Sign up information is provided on this After Visit Summary.  MyChart is used to connect with patients for Virtual Visits (Telemedicine).  Patients are able to view lab/test results, encounter notes, upcoming appointments, etc.  Non-urgent messages can be sent to your provider as well.   To learn more about what you can do with MyChart, go to [email or web address].    Your next appointment:   1 month(s)  The format for your next appointment:   In Person  Provider:   Marrianne Mood, PA-C   Other Instructions  We recommend a maximum of 2g sodium per day and 2L total fluid per day. Fluids include coffee, tea, water, and juice.  In addition, we recommend you monitor both your daily weight and daily BP at the same time each day - bring this long into the office.

## 2020-12-07 NOTE — Progress Notes (Addendum)
Office Visit    Patient Name: Theresa [name] Date of Encounter: 12/07/2020  PCP:  McLean-Scocuzza, Nino Glow, MD   [place]  Cardiologist:  Ida Rogue, MD  Advanced Practice Provider:  No care team member to display Electrophysiologist:  None  Chief Complaint    Chief Complaint  Patient presents with   Follow-up    6 Months follow up. Medications verbally reviewed with patient .     65 y.o. female with history of CAD s/p 2014 RCA stenting with repeat 2017 LHC for positive stress test recommending medical management, moderate MR, HFpEF, ESRD presumed secondary to diabetic neuropathy previously on HD s/p deceased donor renal transplant on 03/30/2019, COPD, PVD, bilateral carotid artery disease, DM2 with diabetic neuropathy and retinopathy, sickle cell trait, hypertension, hyperlipidemia, lumbar radiculopathy, thyroid nodule, esophageal stenosis with history of esophageal ulcers s/p dilation, remote tobacco use with 25+ years of smoking (quit 2013-2014), and seen today for 6 mo follow-up.  Past Medical History    Past Medical History:  Diagnosis Date   Anemia of chronic disease    Anginal pain ([place])    Carotid arterial disease ([place])    a. 02/2013 U/S: 40-59% bilat ICA stenosis.   Chronic constipation    Chronic diastolic CHF (congestive heart failure) ([place])    a. 10/2013 Echo [facility]): EF 55-60%, mod conc LVH, mod MR, mildly dil LA, mild Ao sclerosis w/o stenosis; b. 02/2016 Echo: EF 55-60%, no rwma, Gr DD, mild AS, mod to sev MR, mildly dil LA, PASP 30mmHg; c. 07/2017 Echo [facility]): EF >55%, mild to mod LVH, Gr2 DD, Ao scl, Mod MR, mod dil LA, nl RV fxn.   Colon polyps    COPD (chronic obstructive pulmonary disease) (HCC)    Coronary artery disease    a. 05/2013 NSTEMI/PCI: LM 20d, LAD min irregs, LCX small, nl, OM1 nl, RCA dom 49m (2.5x16 Promus DES), PDA1 80p; b. 04/2015 Cath: LM nl, LAD 14m, D1/2 min irregs, LCX 35p/m, OM2/3 min irregs, RCA patent mid stent,  RPDA 70ost, RPLB1 30, RPLB2/3 min irregs, EF 55-65%--> Med Rx; c. 06/2017 [facility]): No ischemia/infarct, EF 64%.   Diabetes mellitus    Diabetic neuropathy ([place])    Diabetic retinopathy ([place]) 05/28/2013   Hx bilat retinal detachment, proliferative diab retinopathy and bilat vitreous hemorrhage    Emphysema    ESRD on hemodialysis ([place])    a. [facility] in [place], [place] [phone]/Dr. [name], on a MWF schedule.  She started dialysis in Feb 2014.  Etiology of renal failure not known, likely diabetes.  Has a left upper arm AV graft.   History of bronchitis    Mar 2012   History of pneumonia    June 2012   History of tobacco abuse    a. Quit 2012.   Hyperlipidemia    Hypertension    Moderate to severe mitral insufficiency    a. 10/2013 Echo: EF 55-60%, mod MR; b. 02/2016 Echo: EF 55-60%, mod to sev MR directed posteriorly--felt to be dynamic -worse with volume overload; c. 07/2017 Echo [facility]): EF >55%. Mod MR.   Myocardial infarct [facility]) 05/2013   Peripheral vascular disease ([place])    Sickle cell trait ([place])    Thyroid nodule    [place] 06/2017 due to f/u US in 1 year    Past Surgical History:  Procedure Laterality Date   A/V FISTULAGRAM Left 11/10/2017   Procedure: A/V FISTULAGRAM;  Surgeon: Katha Cabal, MD;  Location:  [place] CV LAB;  Service: Cardiovascular;  Laterality: Left;   A/V SHUNTOGRAM Left 02/08/2019   Procedure: A/V SHUNTOGRAM;  Surgeon: Katha Cabal, MD;  Location: [place] CV LAB;  Service: Cardiovascular;  Laterality: Left;   ABDOMINAL HYSTERECTOMY     2000   CARDIAC CATHETERIZATION     CARDIAC CATHETERIZATION N/A 05/02/2015   Procedure: Left Heart Cath and Coronary Angiography;  Surgeon: [name], MD;  Location: [place] CV LAB;  Service: Cardiovascular;  Laterality: N/A;   COLONOSCOPY WITH PROPOFOL N/A 07/08/2016   Procedure: COLONOSCOPY WITH PROPOFOL;  Surgeon: [name], MD;  Location: [facility] ENDOSCOPY;  Service: Endoscopy;  Laterality: N/A;    COLONOSCOPY WITH PROPOFOL N/A 08/20/2017   Procedure: COLONOSCOPY WITH PROPOFOL;  Surgeon: [name], MD;  Location: [facility] ENDOSCOPY;  Service: Gastroenterology;  Laterality: N/A;   colonscopy     CORONARY ANGIOPLASTY  05/28/2014   stent placement to the mid RCA   CYSTOURETHROSCOPY     [facility] urology with stent placement 11 or 05/2019 and stent removal 06/13/19    DILATION AND CURETTAGE OF UTERUS     several in the early 80's   ESOPHAGOGASTRODUODENOSCOPY     2012   ESOPHAGOGASTRODUODENOSCOPY (EGD) WITH PROPOFOL N/A 03/11/2018   Procedure: ESOPHAGOGASTRODUODENOSCOPY (EGD) WITH PROPOFOL;  Surgeon: [name], MD;  Location: [facility] ENDOSCOPY;  Service: Gastroenterology;  Laterality: N/A;   EYE SURGERY     bilateral laser 2012   EYE SURGERY     right   EYE SURGERY     x4 both eyes   GAS INSERTION  09/30/2011   Procedure: INSERTION OF GAS;  Surgeon: [name], MD;  Location: [place];  Service: Ophthalmology;  Laterality: Right;  [id number]   GAS/FLUID EXCHANGE  09/30/2011   Procedure: GAS/FLUID EXCHANGE;  Surgeon: [name], MD;  Location: [place];  Service: Ophthalmology;  Laterality: Right;   KIDNEY TRANSPLANT     03/30/19 Dr. Mickel Baas Thomas/Dr. Cristie Hem Zendel/Dr. Gilford Raid; [facility] transplant Dr. Horald Chestnut will be primary [phone] coordinator Sarah Wynkoop [phone]   KIDNEY TRANSPLANT     03/30/19 [facility]   LEFT HEART CATHETERIZATION WITH CORONARY ANGIOGRAM N/A 05/28/2013   Procedure: LEFT HEART CATHETERIZATION WITH CORONARY ANGIOGRAM;  Surgeon: [name], MD;  Location: [facility] CATH LAB;  Service: Cardiovascular;  Laterality: N/A;   PARS PLANA VITRECTOMY  04/22/2011   Procedure: PARS PLANA VITRECTOMY WITH 25 GAUGE;  Surgeon: [name], MD;  Location: [place];  Service: Ophthalmology;  Laterality: Left;  membrane peel, endolaser, gas fluid exchange, silicone oil, repair of complex traction retinal detachment   PARS PLANA VITRECTOMY  09/30/2011   Procedure: PARS PLANA VITRECTOMY WITH 25  GAUGE;  Surgeon: [name], MD;  Location: [place];  Service: Ophthalmology;  Laterality: Right;  Endolaser; Repair of Complex Traction Retinal Detachment   PARS PLANA VITRECTOMY  02/24/2012   Procedure: PARS PLANA VITRECTOMY WITH 25 GAUGE;  Surgeon: [name], MD;  Location: [place];  Service: Ophthalmology;  Laterality: Left;   PTCA     SILICON OIL REMOVAL  4/97/0263   Procedure: SILICON OIL REMOVAL;  Surgeon: [name], MD;  Location: [name];  Service: Ophthalmology;  Laterality: Left;   THROMBECTOMY / ARTERIOVENOUS GRAFT REVISION     TUBAL LIGATION     1979   UPPER EXTREMITY ANGIOGRAPHY Left 02/08/2019   Procedure: UPPER EXTREMITY ANGIOGRAPHY;  Surgeon: Katha Cabal, MD;  Location: [place] INVASIVE CV  LAB;  Service: Cardiovascular;  Laterality: Left;    Allergies  Allergies  Allergen Reactions   Hydrocodone Hives   Lisinopril     Unknown reaction     History of Present Illness    ANGELENA SAND is a 65 y.o. female with complex PMH as above including previous HD due to ESRD now s/p renal transplant, CAD s/p RCA stenting with repeat 2017 cath without obstructive Dz, hypertension, hyperlipidemia, DM 2, moderate mitral valve regurgitation, remote tobacco use for 25 years and quitting in 2013-2014, COPD, sickle cell trait, peripheral vascular dz, carotid disease, and HFpEF.   In 05/2013, she suffered a non-STEMI and underwent DES placement to the RCA.  LHC 04/2016 after an abnormal stress test with patent RCA stent and no  obstructive disease.  07/2017 echo at [facility] showed stable, moderate mitral regurgitation and aortic sclerosis with normal LV function.    Seen 04/2020 for hypertensive urgency and flash pulmonary edema with MR at [facility]. She had stopped several BP medications. She was hypertensive throughout her admission with restart of clonidine. SL nitro was recommended for any spikes of pressure or anginal sx in the setting of flash pulmonary edema. Echo showed moderate MR  with medical management recommended and thought likely dynamic in the setting of CHF with recommendation for volume control.   Last seen in clinic 04/2020 after recent admission and reported feeling well.  Today, 12/07/2020, she returns to clinic and reports she has been doing well since her previous visit.  BP significantly elevated at 200/60 with repeat blood pressure similar to that of her initial BP.  She was administered medication while still in clinic and remained in the room with nurses monitoring BP during that time --ultimately, SBP 140s prior to patient leaving the office.  She reports that she is asymptomatic with elevated BP.  Medications reviewed with patient report that she has stopped several of her medications since her last visit and including clonidine and carvedilol.  She has not been monitoring her blood pressure at home.  She reports feeling her chest is heavy at times, associated with a feeling of being unable to breathe, even when sitting on the side of the bed.  When this occurs, she usually takes a nitro and Lasix at the same time.  She sees if she feels better after taking these medications.  If she still has chest heaviness and shortness of breath at rest, she uses her inhaler.  If symptoms continue after her inhaler, she uses her nebulizer.  She reports that this occurs on occasion.  On review of previous cardiac clinic notes, this appears to be a new finding since the patient was seen by Dr. Rockey Situ 04/27/2020.  She does deny any presyncope or syncope.  She reports using her Lasix once per month.  She has not needed her sublingual nitro since leaving hospital.  She does report falling 4 months ago, though she hit her tailbone.  She did hit her head but denies any residual symptoms or confusion.  She reports that she continues to abstain from smoking.  No alcohol use or use of drugs. Since her last visit, she has stopped taking Coreg, Clonidine, and ASA with restart of all  today.  Addendum: Jacelyn Grip RN notified me that the clonidine she administered to the pt was expired, and that she realized this after she had already given this dose to the patient. She plans to enter a safety portal for this event.   Home Medications  Current Outpatient Medications  Medication Instructions   acetaminophen (TYLENOL) 1,000 mg, Oral, Daily PRN   albuterol (PROVENTIL) 2.5 mg, Nebulization, Every 6 hours PRN   albuterol (VENTOLIN HFA) 108 (90 Base) MCG/ACT inhaler 1-2 puffs, Inhalation, Every 6 hours PRN   amLODipine (NORVASC) 10 mg, Oral, Daily   atorvastatin (LIPITOR) 20 mg, Oral, Daily   cloNIDine (CATAPRES) 0.1 mg, Oral, 2 times daily   esomeprazole (NEXIUM) 20 mg, Oral, Daily   furosemide (LASIX) 20 mg, Oral, Daily PRN   loratadine (CLARITIN) 10 mg, Oral, Daily   magnesium oxide (MAG-OX) 400 mg, Oral, 2 times daily   nitroGLYCERIN (NITROSTAT) 0.4 mg, Sublingual, Every 5 min PRN   oxyCODONE-acetaminophen (PERCOCET/ROXICET) 5-325 MG tablet 1 tablet, Oral, 2 times daily PRN   pregabalin (LYRICA) 75 mg, Oral, 2 times daily   sodium bicarbonate 650 mg, Oral, 2 times daily   Tacrolimus ER 8 mg, Oral, Every morning   Vitamin D (Ergocalciferol) (DRISDOL) 50,000 Units, Oral, Every 7 days, Mondays     Review of Systems    She reports episodes of chest heaviness/difficulty breathing that cannot be alleviated with sitting on the edge of the bed.  She usually requires nitro and Lasix.  If chest pain/heaviness and shortness of breath continues, she uses her inhaler.  After that time, she uses her nebulizer.  She denies pnd, orthopnea, n, v, dizziness, syncope, edema, weight gain, or early satiety.   All other systems reviewed and are otherwise negative except as noted above.  Physical Exam    VS:  BP (!) 200/60 (BP Location: Left Arm, Patient Position: Sitting, Cuff Size: Normal)   Pulse 65   Ht 5\' 7"  (1.702 m)   Wt 156 lb (70.8 kg)   SpO2 97%   BMI 24.43 kg/m  , BMI Body  mass index is 24.43 kg/m. GEN: Well nourished, well developed, in no acute distress. HEENT: normal. Neck: Supple, no JVD, carotid bruits, or masses. Cardiac: RRR, 2/6 systolic murmur, rubs, or gallops. No clubbing, cyanosis, edema.  Radials/DP/PT 2+ and equal bilaterally.  Respiratory:  Respirations regular and unlabored, diffuse expiratory wheeze. GI: Soft, nontender, nondistended, BS + x 4. MS: no deformity or atrophy. Skin: warm and dry, no rash. Neuro:  Strength and sensation are intact. Psych: Normal affect.  Accessory Clinical Findings    ECG personally reviewed by me today - NSR, 65bpm, LVH with repolarization abnormalities, possible LAE- no acute changes.  VITALS Reviewed today   Temp Readings from Last 3 Encounters:  12/04/20 98.1 F (36.7 C) (Oral)  04/21/20 97.6 F (36.4 C) (Oral)  02/24/20 97.9 F (36.6 C) (Oral)   BP Readings from Last 3 Encounters:  12/07/20 (!) 200/60  12/04/20 120/60  11/22/20 (!) 152/60   Pulse Readings from Last 3 Encounters:  12/07/20 65  12/04/20 64  11/22/20 73    Wt Readings from Last 3 Encounters:  12/07/20 156 lb (70.8 kg)  12/04/20 155 lb 12.8 oz (70.7 kg)  12/03/20 154 lb (69.9 kg)     LABS  reviewed today   Lab Results  Component Value Date   WBC 4.5 04/20/2020   HGB 8.5 (L) 04/20/2020   HCT 26.1 (L) 04/20/2020   MCV 96.7 04/20/2020   PLT 174 04/20/2020   Lab Results  Component Value Date   CREATININE 1.39 (H) 04/21/2020   BUN 23 04/21/2020   NA 134 (L) 04/21/2020   K 4.6 04/21/2020   CL 105 04/21/2020   CO2 23 04/21/2020  Lab Results  Component Value Date   ALT 8 12/04/2020   AST 12 12/04/2020   ALKPHOS 135 (H) 12/04/2020   BILITOT 0.4 12/04/2020   Lab Results  Component Value Date   CHOL 161 05/22/2016   HDL 59 05/22/2016   LDLCALC 89 05/22/2016   LDLDIRECT 100.6 11/12/2012   TRIG 65 05/22/2016   CHOLHDL 2.7 05/22/2016    Lab Results  Component Value Date   HGBA1C 5.0 04/18/2020   Lab  Results  Component Value Date   TSH 0.96 12/04/2020     STUDIES/PROCEDURES reviewed today   Echo 04/18/20  1. Left ventricular ejection fraction, by estimation, is 60 to 65%. The  left ventricle has normal function. The left ventricle has no regional  wall motion abnormalities. There is mild left ventricular hypertrophy.  Left ventricular diastolic parameters  are consistent with Grade II diastolic dysfunction (pseudonormalization).   2. Right ventricular systolic function is normal. The right ventricular  size is normal.   3. Left atrial size was moderately dilated.   4. Moderate mitral valve regurgitation.   5. Aortic valve regurgitation is mild. Mild aortic valve stenosis.    2D echo 12/2019: 1. Left ventricular ejection fraction, by estimation, is 60 to 65%. The  left ventricle has normal function. The left ventricle has no regional  wall motion abnormalities. There is moderate left ventricular hypertrophy.  Left ventricular diastolic  parameters are consistent with Grade II diastolic dysfunction  (pseudonormalization).   2. Right ventricular systolic function is normal. The right ventricular  size is normal.   3. Left atrial size was mildly dilated.   4. The mitral valve is normal in structure. Moderate mitral valve  regurgitation.   5. The aortic valve is normal in structure. Aortic valve regurgitation is  mild to moderate.  Carotids 2019 Summary:  Right Carotid: Velocities in the right ICA are consistent with a 1-39%  stenosis.                 Non-hemodynamically significant plaque <50% noted in the  CCA. The                 ECA appears >50% stenosed.   Left Carotid: Velocities in the left ICA are consistent with a 1-39%  stenosis.                Non-hemodynamically significant plaque noted in the CCA. The  ECA                appears >50% stenosed.   Vertebrals:  Bilateral vertebral arteries demonstrate antegrade flow.  Subclavians: Left subclavian artery was  stenotic. Left subclavian artery  flow               was disturbed. Normal flow hemodynamics were seen in the  right               subclavian artery.  __________  Assessment & Plan    Accelerated hypertension, poorly controlled hypertension, goal BP 130/80 or lower --Asymptomatic with BP today poorly controlled at 200/160. Consider as contributing to episodes of chest heaviness and shortness of breath.  Discussed risks associated with SBP of 200, and medications administered in the clinic until BP better controlled.  Will restart previous clonidine 0.1 mg twice daily and previous carvedilol 12.5 mg twice daily - she still has both of these at home.  If BB not tolerated at previous dose, she may cut the dose it in half as discussed today and  then increase back up to 12.5mg . Continue current Lasix with recommendation to monitor BP, HR, and weight at home.  Continue amlodipine 10 mg daily.  Given renal transplant, deferring addition of ACE/ARB. Reviewed salt and fluid restrictions at length. Call the office if continued elevated BP.  Chronic HFpEF --Reports SOB with chest heaviness as above. Consider that BP may be elevated due to diet with salt and fluid restrictions reviewed, as this can cause fluctuations in volume status and BP. Reports SOB but no other s/sx of volume overload. 04/2020 EF 60 to 65%, mild LVH, G2DD, LAE, moderate MR, mild AR/AS.  As below, recommend updating echo. Continue current medications as above.  CAD s/p PCI -- Reports episodes of chest heaviness and shortness of breath.  Episodes usually require nitro and Lasix.  If no improvement, an inhaler is used.  After that, she tries her nebulizer.  She will monitor her blood pressure closely. Once BP controlled , if heaviness / SOB continues, recommend reassessment of her mitral valve +/- repeat stress testing. Previous 2017 LHC as above.  Recommend aggressive risk factor modification.  Increase activity as tolerated.  Diet changes  discussed.  Continue ASA (restarted today as well), restart Coreg. Continue Lipitor and PRN SL nitro.  BP, heart rate, glycemic, and LDL control recommended.  She had discontinued Coreg and clonidine between visits with recommendation for restart of both as above.   Moderate mitral regurgitation --Consider as contributing to her symptoms, and likely aggravated with elevated BP since off her clonidine and Coreg, both of which were restarted today.  Recommend updated echo to reassess valve by 04/2021 or earlier if possible.  BP/heart rate control recommended in the setting of valvular disease.  Monitor volume and Na/fluid intake.   ESRD s/p renal transplant -- Continue to follow with nephrology.  She is s/p hemodialysis and underwent renal transplant.  Monitor kidney function closely.  Caution with medications given renal transplant status.   HLD, goal LDL <70 --Continue statin.  Bilateral carotid artery disease --Continue to monitor with periodic imaging.  Continue (restarted) ASA and statin.  Recommend LDL and glycemic control, as well as control of heart rate and blood pressure.  Ongoing smoking cessation encouraged.  DM2 --Glycemic control recommended per PCP.  Will defer addition of ACE/ARB, despite comorbid HTN, and given renal transplant.   Addendum: RN notified me that the clonidine she administered to the pt was expired, and that she realized this after she had already given this dose to the patient. She plans to enter a safety portal for herself.   Medication changes: Restarted clonidine and Coreg Labs ordered: None Studies / Imaging ordered: None Future considerations: Echo, MPI, repeat carotids.  Disposition: RTC 1 month to reassess BP with restarted Coreg and Clonidine  *Please be aware that the above documentation was completed voice recognition software and may contain dictation errors.     Arvil Chaco, PA-C 12/07/2020

## 2020-12-07 NOTE — Telephone Encounter (Signed)
Patient given information to call and speak with their billing department. Patient verbalized understanding   [facility] [address] [place] [address] [phone]

## 2020-12-09 MED ORDER — CARVEDILOL 12.5 MG PO TABS: 12.5000 mg | ORAL_TABLET | Freq: Two times a day (BID) | ORAL | 3 refills | Status: DC

## 2020-12-11 ENCOUNTER — Other Ambulatory Visit: Payer: Self-pay

## 2020-12-11 ENCOUNTER — Ambulatory Visit
Admission: RE | Admit: 2020-12-11 | Discharge: 2020-12-11 | Disposition: A | Discharge status: Home / Self Care | Payer: Medicare Other | Source: Ambulatory Visit | Attending: Internal Medicine | Admitting: Internal Medicine

## 2020-12-11 DIAGNOSIS — R921 Mammographic calcification found on diagnostic imaging of breast: Secondary | ICD-10-CM | POA: Diagnosis not present

## 2020-12-11 DIAGNOSIS — R922 Inconclusive mammogram: Secondary | ICD-10-CM | POA: Diagnosis not present

## 2020-12-11 DIAGNOSIS — Z940 Kidney transplant status: Secondary | ICD-10-CM | POA: Diagnosis not present

## 2020-12-11 MED ORDER — ERGOCALCIFEROL (VITAMIN D2) 1,250 MCG (50,000 UNIT) CAPSULE
ORAL_CAPSULE | ORAL | 2 refills | 28 days | Status: CP
Start: 2020-12-11 — End: 2021-12-11
  Filled 2020-12-13: qty 4, 28d supply, fill #0

## 2020-12-11 NOTE — Unmapped (Signed)
Pt request for RX Refillergocalciferol-1,250 mcg, 50,000 unit, (VITAMIN D2-1,250 MCG, 50,000 UNIT,) 1,250 mcg (50,000 unit) capsule

## 2020-12-11 NOTE — Unmapped (Signed)
The [facility] has made a third and final attempt to reach this patient to refill the following medication:envarsus 4mg .      We have left voicemails on the following phone numbers: [phone] and unable to send mychart message.    Dates contacted: 6/24, 6/30, 7/5  Last scheduled delivery: 6/6- taking 1x4mg  tabs per day (total 7mg  with 1mg  tabs) - on 6/21 dose inc to 8mg  daily so needing 2x4mg  tabs    The patient may be at risk of non-compliance with this medication. The patient should call the [facility] at [phone] (option 4) to refill medication.    Thad Ranger   [facility] Specialty Pharmacist

## 2020-12-12 LAB — BASIC METABOLIC PANEL
BLOOD UREA NITROGEN: 26 mg/dL (ref 8–27)
BUN / CREAT RATIO: 16 (ref 12–28)
CALCIUM: 10.3 mg/dL (ref 8.7–10.3)
CHLORIDE: 101 mmol/L (ref 96–106)
CO2: 20 mmol/L (ref 20–29)
CREATININE: 1.64 mg/dL — ABNORMAL HIGH (ref 0.57–1.00)
EGFR: 35 mL/min/{1.73_m2} — ABNORMAL LOW
GLUCOSE: 107 mg/dL — ABNORMAL HIGH (ref 65–99)
POTASSIUM: 5.5 mmol/L — ABNORMAL HIGH (ref 3.5–5.2)
SODIUM: 136 mmol/L (ref 134–144)

## 2020-12-12 LAB — CBC W/ DIFFERENTIAL
BANDED NEUTROPHILS ABSOLUTE COUNT: 0 10*3/uL (ref 0.0–0.1)
BASOPHILS ABSOLUTE COUNT: 0 10*3/uL (ref 0.0–0.2)
BASOPHILS RELATIVE PERCENT: 0 %
EOSINOPHILS ABSOLUTE COUNT: 0.2 10*3/uL (ref 0.0–0.4)
EOSINOPHILS RELATIVE PERCENT: 2 %
HEMATOCRIT: 36.1 % (ref 34.0–46.6)
HEMOGLOBIN: 12.2 g/dL (ref 11.1–15.9)
IMMATURE GRANULOCYTES: 0 %
LYMPHOCYTES ABSOLUTE COUNT: 0.8 10*3/uL (ref 0.7–3.1)
LYMPHOCYTES RELATIVE PERCENT: 9 %
MEAN CORPUSCULAR HEMOGLOBIN CONC: 33.8 g/dL (ref 31.5–35.7)
MEAN CORPUSCULAR HEMOGLOBIN: 31.8 pg (ref 26.6–33.0)
MEAN CORPUSCULAR VOLUME: 94 fL (ref 79–97)
MONOCYTES ABSOLUTE COUNT: 0.5 10*3/uL (ref 0.1–0.9)
MONOCYTES RELATIVE PERCENT: 5 %
NEUTROPHILS ABSOLUTE COUNT: 7.9 10*3/uL — ABNORMAL HIGH (ref 1.4–7.0)
NEUTROPHILS RELATIVE PERCENT: 84 %
PLATELET COUNT: 124 10*3/uL — ABNORMAL LOW (ref 150–450)
RED BLOOD CELL COUNT: 3.84 x10E6/uL (ref 3.77–5.28)
RED CELL DISTRIBUTION WIDTH: 13.9 % (ref 11.7–15.4)
WHITE BLOOD CELL COUNT: 9.5 10*3/uL (ref 3.4–10.8)

## 2020-12-12 LAB — MAGNESIUM: MAGNESIUM: 1.9 mg/dL (ref 1.6–2.3)

## 2020-12-12 LAB — PHOSPHORUS: PHOSPHORUS, SERUM: 3.2 mg/dL (ref 3.0–4.3)

## 2020-12-13 LAB — TACROLIMUS LEVEL: TACROLIMUS BLOOD: 3.2 ng/mL (ref 2.0–20.0)

## 2020-12-13 MED FILL — MAGNESIUM OXIDE 400 MG (241.3 MG MAGNESIUM) TABLET: ORAL | 60 days supply | Qty: 120 | Fill #2

## 2020-12-17 DIAGNOSIS — Z940 Kidney transplant status: Principal | ICD-10-CM

## 2020-12-18 NOTE — Unmapped (Signed)
[facility] Refill Coordination Note    Specialty Medication(s) to be Shipped:   Transplant: Envarsus 4mg     Other medication(s) to be shipped: pregablin 75mg ,sodium bicarb 650mg      Michele Cisneros, DOB: June 22, 1955  Phone: [phone] (home)       All above HIPAA information was verified with patient.     Was a Nurse, learning disability used for this call? No    Completed refill call assessment today to schedule patient's medication shipment from the [facility] [phone]).  All relevant notes have been reviewed.     Specialty medication(s) and dose(s) confirmed: Regimen is correct and unchanged.   Changes to medications: Annelle reports no changes at this time.  Changes to insurance: No  New side effects reported not previously addressed with a pharmacist or physician: None reported  Questions for the pharmacist: No    Confirmed patient received a Conservation officer, historic buildings and a Surveyor, mining with first shipment. The patient will receive a drug information handout for each medication shipped and additional FDA Medication Guides as required.       DISEASE/MEDICATION-SPECIFIC INFORMATION        N/A    SPECIALTY MEDICATION ADHERENCE     Medication Adherence    Patient reported X missed doses in the last month: 0  Specialty Medication: envarsus 4mg   Patient is on additional specialty medications: No  Patient is on more than two specialty medications: No  Any gaps in refill history greater than 2 weeks in the last 3 months: no  Demonstrates understanding of importance of adherence: yes  Informant: patient  Reliability of informant: reliable  Provider-estimated medication adherence level: good  Patient is at risk for Non-Adherence: No              Were doses missed due to medication being on hold? No    envarsus 4 mg: 5 days of medicine on hand             REFERRAL TO PHARMACIST     Referral to the pharmacist: Not needed      [facility]     Shipping address confirmed in Epic.     Delivery Scheduled: Yes, Expected medication delivery date: 07/14.     Medication will be delivered via Next Day Courier to the prescription address in Epic WAM.    Antonietta Barcelona   [facility] Specialty Technician

## 2020-12-19 DIAGNOSIS — Z940 Kidney transplant status: Principal | ICD-10-CM

## 2020-12-20 ENCOUNTER — Telehealth: Payer: Medicare Other | Admitting: Internal Medicine

## 2020-12-20 ENCOUNTER — Telehealth: Payer: Self-pay | Admitting: Internal Medicine

## 2020-12-20 ENCOUNTER — Ambulatory Visit
Admission: RE | Admit: 2020-12-20 | Discharge: 2020-12-20 | Disposition: A | Discharge status: Home / Self Care | Payer: Medicare Other | Source: Ambulatory Visit | Attending: Internal Medicine | Admitting: Internal Medicine

## 2020-12-20 ENCOUNTER — Other Ambulatory Visit: Payer: Self-pay

## 2020-12-20 DIAGNOSIS — Z940 Kidney transplant status: Secondary | ICD-10-CM | POA: Diagnosis not present

## 2020-12-20 DIAGNOSIS — R1031 Right lower quadrant pain: Secondary | ICD-10-CM

## 2020-12-20 DIAGNOSIS — N261 Atrophy of kidney (terminal): Secondary | ICD-10-CM | POA: Diagnosis not present

## 2020-12-20 DIAGNOSIS — I700 Atherosclerosis of aorta: Secondary | ICD-10-CM | POA: Diagnosis not present

## 2020-12-20 MED FILL — PREGABALIN 75 MG CAPSULE: ORAL | 30 days supply | Qty: 90 | Fill #3

## 2020-12-20 MED FILL — SODIUM BICARBONATE 650 MG TABLET: ORAL | 30 days supply | Qty: 60 | Fill #8

## 2020-12-20 NOTE — Telephone Encounter (Signed)
Unable to get in contact with Patient.   Patient no-showed today's appointment; appointment was for 12/20/20, provider notified for review of record. Letter sent for patient to call in and re-schedule.

## 2020-12-20 NOTE — Telephone Encounter (Signed)
Left message to return call. Informed in message that appointment will be cancelled after 2:15 with no call back.

## 2020-12-20 NOTE — Telephone Encounter (Signed)
Left message to return call. Attempting to start 2 pm virtual visit

## 2020-12-24 ENCOUNTER — Encounter: Payer: Self-pay | Admitting: Internal Medicine

## 2020-12-24 NOTE — Progress Notes (Signed)
[employer] form faxed for nebulizer machine. Confirmation received and sent to scan.

## 2020-12-31 DIAGNOSIS — Z940 Kidney transplant status: Principal | ICD-10-CM

## 2021-01-01 DIAGNOSIS — Z940 Kidney transplant status: Secondary | ICD-10-CM | POA: Diagnosis not present

## 2021-01-07 MED ORDER — PREGABALIN 75 MG CAPSULE
ORAL_CAPSULE | ORAL | 3 refills | 30.00000 days | Status: CP
Start: 2021-01-07 — End: ?
  Filled 2021-01-17: qty 90, 30d supply, fill #0

## 2021-01-07 NOTE — Unmapped (Signed)
[facility] Refill Coordination Note    Specialty Medication(s) to be Shipped:   Transplant: Envarsus 4mg     Other medication(s) to be shipped: pregabalin and sodium bicarb     Michele Cisneros, DOB: 12/23/1955  Phone: [phone] (home)       All above HIPAA information was verified with patient.     Was a Nurse, learning disability used for this call? No    Completed refill call assessment today to schedule patient's medication shipment from the [facility] [phone]).  All relevant notes have been reviewed.     Specialty medication(s) and dose(s) confirmed: Regimen is correct and unchanged.   Changes to medications: Naylani reports no changes at this time.  Changes to insurance: No  New side effects reported not previously addressed with a pharmacist or physician: None reported  Questions for the pharmacist: No    Confirmed patient received a Conservation officer, historic buildings and a Surveyor, mining with first shipment. The patient will receive a drug information handout for each medication shipped and additional FDA Medication Guides as required.       DISEASE/MEDICATION-SPECIFIC INFORMATION        N/A    SPECIALTY MEDICATION ADHERENCE     Medication Adherence    Patient reported X missed doses in the last month: 0  Specialty Medication: Envarsus 4mg   Patient is on additional specialty medications: No        Were doses missed due to medication being on hold? No    Envarsus 4 mg: 12 days of medicine on hand     REFERRAL TO PHARMACIST     Referral to the pharmacist: Not needed      [facility]     Shipping address confirmed in Epic.     Delivery Scheduled: Yes, Expected medication delivery date: 01/18/2021.     Medication will be delivered via UPS to the prescription address in Epic WAM.    Lorelei Pont [facility] Specialty Technician

## 2021-01-08 DIAGNOSIS — Z940 Kidney transplant status: Principal | ICD-10-CM

## 2021-01-08 DIAGNOSIS — N186 End stage renal disease: Principal | ICD-10-CM

## 2021-01-10 ENCOUNTER — Encounter: Admit: 2021-01-10 | Discharge: 2021-01-10 | Payer: MEDICARE

## 2021-01-10 ENCOUNTER — Encounter: Admit: 2021-01-10 | Discharge: 2021-01-10 | Payer: MEDICARE | Attending: Nephrology | Primary: Nephrology

## 2021-01-10 DIAGNOSIS — Z940 Kidney transplant status: Principal | ICD-10-CM

## 2021-01-10 DIAGNOSIS — N186 End stage renal disease: Principal | ICD-10-CM

## 2021-01-10 DIAGNOSIS — I5030 Unspecified diastolic (congestive) heart failure: Secondary | ICD-10-CM | POA: Diagnosis not present

## 2021-01-10 DIAGNOSIS — Z7951 Long term (current) use of inhaled steroids: Secondary | ICD-10-CM | POA: Diagnosis not present

## 2021-01-10 DIAGNOSIS — Z23 Encounter for immunization: Secondary | ICD-10-CM | POA: Diagnosis not present

## 2021-01-10 DIAGNOSIS — E1151 Type 2 diabetes mellitus with diabetic peripheral angiopathy without gangrene: Secondary | ICD-10-CM | POA: Diagnosis not present

## 2021-01-10 DIAGNOSIS — E1122 Type 2 diabetes mellitus with diabetic chronic kidney disease: Secondary | ICD-10-CM | POA: Diagnosis not present

## 2021-01-10 DIAGNOSIS — I951 Orthostatic hypotension: Secondary | ICD-10-CM | POA: Diagnosis not present

## 2021-01-10 DIAGNOSIS — I132 Hypertensive heart and chronic kidney disease with heart failure and with stage 5 chronic kidney disease, or end stage renal disease: Secondary | ICD-10-CM | POA: Diagnosis not present

## 2021-01-10 DIAGNOSIS — D849 Immunodeficiency, unspecified: Secondary | ICD-10-CM | POA: Diagnosis not present

## 2021-01-10 DIAGNOSIS — M5187 Other intervertebral disc disorders, lumbosacral region: Secondary | ICD-10-CM | POA: Diagnosis not present

## 2021-01-10 DIAGNOSIS — Z20822 Contact with and (suspected) exposure to covid-19: Secondary | ICD-10-CM | POA: Diagnosis not present

## 2021-01-10 DIAGNOSIS — I2510 Atherosclerotic heart disease of native coronary artery without angina pectoris: Secondary | ICD-10-CM | POA: Diagnosis not present

## 2021-01-10 DIAGNOSIS — E782 Mixed hyperlipidemia: Secondary | ICD-10-CM | POA: Diagnosis not present

## 2021-01-10 DIAGNOSIS — R911 Solitary pulmonary nodule: Secondary | ICD-10-CM | POA: Diagnosis not present

## 2021-01-10 DIAGNOSIS — I6523 Occlusion and stenosis of bilateral carotid arteries: Secondary | ICD-10-CM | POA: Diagnosis not present

## 2021-01-10 DIAGNOSIS — Z87891 Personal history of nicotine dependence: Secondary | ICD-10-CM | POA: Diagnosis not present

## 2021-01-10 DIAGNOSIS — K222 Esophageal obstruction: Secondary | ICD-10-CM | POA: Diagnosis not present

## 2021-01-10 DIAGNOSIS — M5416 Radiculopathy, lumbar region: Secondary | ICD-10-CM | POA: Diagnosis not present

## 2021-01-10 DIAGNOSIS — J439 Emphysema, unspecified: Secondary | ICD-10-CM | POA: Diagnosis not present

## 2021-01-10 DIAGNOSIS — Z7982 Long term (current) use of aspirin: Secondary | ICD-10-CM | POA: Diagnosis not present

## 2021-01-10 DIAGNOSIS — Z79899 Other long term (current) drug therapy: Secondary | ICD-10-CM | POA: Diagnosis not present

## 2021-01-10 DIAGNOSIS — Z4822 Encounter for aftercare following kidney transplant: Secondary | ICD-10-CM | POA: Diagnosis not present

## 2021-01-10 DIAGNOSIS — E1142 Type 2 diabetes mellitus with diabetic polyneuropathy: Secondary | ICD-10-CM | POA: Diagnosis not present

## 2021-01-10 LAB — URINALYSIS
BACTERIA: NONE SEEN /HPF
BILIRUBIN UA: NEGATIVE
BLOOD UA: NEGATIVE
GLUCOSE UA: NEGATIVE
KETONES UA: NEGATIVE
LEUKOCYTE ESTERASE UA: NEGATIVE
NITRITE UA: NEGATIVE
PH UA: 7 (ref 5.0–9.0)
PROTEIN UA: NEGATIVE
RBC UA: <1 /HPF (ref <4)
SPECIFIC GRAVITY UA: 1.025 (ref 1.005–1.030)
SQUAMOUS EPITHELIAL: 1 /HPF (ref 0–5)
UROBILINOGEN UA: 0.2
WBC UA: <1 /HPF (ref 0–5)

## 2021-01-10 LAB — CBC W/ AUTO DIFF
BASOPHILS ABSOLUTE COUNT: 0.1 10*9/L (ref 0.0–0.1)
BASOPHILS RELATIVE PERCENT: 0.6 %
EOSINOPHILS ABSOLUTE COUNT: 0.2 10*9/L (ref 0.0–0.5)
EOSINOPHILS RELATIVE PERCENT: 2.5 %
HEMATOCRIT: 37 % (ref 34.0–44.0)
HEMOGLOBIN: 12.5 g/dL (ref 11.3–14.9)
LYMPHOCYTES ABSOLUTE COUNT: 1 10*9/L — ABNORMAL LOW (ref 1.1–3.6)
LYMPHOCYTES RELATIVE PERCENT: 12.3 %
MEAN CORPUSCULAR HEMOGLOBIN CONC: 33.8 g/dL (ref 32.0–36.0)
MEAN CORPUSCULAR HEMOGLOBIN: 32.3 pg (ref 25.9–32.4)
MEAN CORPUSCULAR VOLUME: 95.6 fL (ref 77.6–95.7)
MEAN PLATELET VOLUME: 9.8 fL (ref 6.8–10.7)
MONOCYTES ABSOLUTE COUNT: 0.3 10*9/L (ref 0.3–0.8)
MONOCYTES RELATIVE PERCENT: 4.3 %
NEUTROPHILS ABSOLUTE COUNT: 6.6 10*9/L (ref 1.8–7.8)
NEUTROPHILS RELATIVE PERCENT: 80.3 %
PLATELET COUNT: 113 10*9/L — ABNORMAL LOW (ref 150–450)
RED BLOOD CELL COUNT: 3.87 10*12/L — ABNORMAL LOW (ref 3.95–5.13)
RED CELL DISTRIBUTION WIDTH: 15.7 % — ABNORMAL HIGH (ref 12.2–15.2)
WBC ADJUSTED: 8.2 10*9/L (ref 3.6–11.2)

## 2021-01-10 LAB — BASIC METABOLIC PANEL
ANION GAP: 2 mmol/L — ABNORMAL LOW (ref 5–14)
BLOOD UREA NITROGEN: 22 mg/dL (ref 9–23)
BUN / CREAT RATIO: 14
CALCIUM: 10.3 mg/dL (ref 8.7–10.4)
CHLORIDE: 106 mmol/L (ref 98–107)
CO2: 25.8 mmol/L (ref 20.0–31.0)
CREATININE: 1.62 mg/dL — ABNORMAL HIGH
EGFR CKD-EPI (2021) FEMALE: 36 mL/min/{1.73_m2} — ABNORMAL LOW (ref >=60)
GLUCOSE RANDOM: 112 mg/dL — ABNORMAL HIGH (ref 70–99)
POTASSIUM: 5.4 mmol/L — ABNORMAL HIGH (ref 3.4–4.8)
SODIUM: 134 mmol/L — ABNORMAL LOW (ref 135–145)

## 2021-01-10 LAB — MAGNESIUM: MAGNESIUM: 2 mg/dL (ref 1.6–2.6)

## 2021-01-10 LAB — PROTEIN / CREATININE RATIO, URINE
CREATININE, URINE: 21.3 mg/dL
PROTEIN URINE: 7.8 mg/dL
PROTEIN/CREAT RATIO, URINE: 0.366

## 2021-01-10 LAB — COVID SPIKE IGG
SARS-COV-2 IGG SEMI-QUANT: <1.85 [AU]/ml (ref <13.00)
SARS-COV-2 SPIKE IGG ANTIBODY: NEGATIVE

## 2021-01-10 LAB — PHOSPHORUS: PHOSPHORUS: 3.4 mg/dL (ref 2.4–5.1)

## 2021-01-10 LAB — TACROLIMUS LEVEL, TROUGH: TACROLIMUS, TROUGH: 4.5 ng/mL — ABNORMAL LOW (ref 5.0–15.0)

## 2021-01-10 LAB — PARATHYROID HORMONE (PTH): PARATHYROID HORMONE INTACT: 263.7 pg/mL — ABNORMAL HIGH (ref 18.4–80.1)

## 2021-01-10 MED ORDER — CLONIDINE HCL 0.1 MG TABLET
ORAL_TABLET | Freq: Two times a day (BID) | ORAL | 11 refills | 30 days | Status: CP
Start: 2021-01-10 — End: 2022-01-10

## 2021-01-10 MED FILL — ATORVASTATIN 20 MG TABLET: ORAL | 90 days supply | Qty: 90 | Fill #3

## 2021-01-10 MED FILL — ESOMEPRAZOLE MAGNESIUM 20 MG CAPSULE,DELAYED RELEASE: ORAL | 90 days supply | Qty: 90 | Fill #3

## 2021-01-10 MED FILL — ERGOCALCIFEROL (VITAMIN D2) 1,250 MCG (50,000 UNIT) CAPSULE: ORAL | 28 days supply | Qty: 4 | Fill #1

## 2021-01-10 NOTE — Progress Notes (Signed)
Cardiology Office Note  Date:  01/11/2021   ID:  Theresa [name], DOB [birth year], MRN [id number]  PCP:  McLean-Scocuzza, Theresa Glow, MD   Chief Complaint  Patient presents with   1 month follow up     "Doing well." Medications reviewed by the patient verbally.     HPI:  [name] is a pleasant 65 -year old woman with  CAD,  Drug-eluting stent placed December 2014 to the mid RCA Repeat cath 2017 for positive stress test at [facility], medical management 25 years of smoking (quit in 2013-2014),  diabetes,  chronic renal insufficiency,   moderate to severe mitral valve regurgitation on prior echocardiogram (improved to moderate MR on recent echo), pneumonia in June of 2012 with admission overnight to [facility] ESRD on dialysis since February 2014, 3 days per week. 59% bilateral carotid arterial disease as of 2017  Positive stress test  03-Apr-2016 at [facility] Renal transplant She presents today for follow-up of her coronary artery disease   Last seen in clinic by myself 04/2020 Blood pressure elevated yesterday [facility], told to increase clonidine up to 0.2 BID  Blood pressure elevated again today Reports that she is stressed In the process of moving, they are selling the house "out from under me"  Denies any chest pain concerning for angina Compliant with her other medications Denies any leg swelling, no edema  She does have a history of hypertensive urgency, flash pulmonary edema  EKG personally reviewed by myself on todays visit Normal sinus rhythm rate 65 bpm LVH  Other past medical history reviewed  Myoview January 2019 at [facility] no ischemia Echocardiogram February 2019 [facility] moderate MR, normal ejection fraction Tolerating hemodialysis Monday Wednesday Friday Echocardiogram July 2020 normal ejection fraction [facility] April 04, 2019 deceased donor renal transplant   hospital November 2021 Hypertensive urgency, flash pulmonary edema, mitral valve regurgitation She had stopped several of her blood pressure  medications, these were restarted with improved blood pressure Now on amlodipine carvedilol clonidine Sublingual nitro for any spikes in pressure  Cardic catheterization 2017 showed 70% disease ostial right PDA, unchanged from 2014 cardiac catheterization otherwise other regions of 20-30% disease, nonobstructive  PMH:   has a past medical history of Anemia of chronic disease, Anginal pain ([place]), Carotid arterial disease ([place]), Chronic constipation, Chronic diastolic CHF (congestive heart failure) ([place]), Colon polyps, COPD (chronic obstructive pulmonary disease) ([place]), Coronary artery disease, Diabetes mellitus, Diabetic neuropathy ([place]), Diabetic retinopathy ([place]) (05/28/2013), Emphysema, ESRD on hemodialysis ([place]), History of bronchitis, History of pneumonia, History of tobacco abuse, Hyperlipidemia, Hypertension, Moderate to severe mitral insufficiency, Myocardial infarct ([place]) (05/2013), Peripheral vascular disease ([place]), Sickle cell trait ([place]), and Thyroid nodule.  PSH:    Past Surgical History:  Procedure Laterality Date   A/V FISTULAGRAM Left 11/10/2017   Procedure: A/V FISTULAGRAM;  Surgeon: Katha Cabal, MD;  Location: [place] [facility];  Service: Cardiovascular;  Laterality: Left;   A/V SHUNTOGRAM Left 02/08/2019   Procedure: A/V SHUNTOGRAM;  Surgeon: Katha Cabal, MD;  Location: [place] [facility];  Service: Cardiovascular;  Laterality: Left;   ABDOMINAL HYSTERECTOMY     2000   CARDIAC CATHETERIZATION     CARDIAC CATHETERIZATION N/A 05/02/2015   Procedure: Left Heart Cath and Coronary Angiography;  Surgeon: [name], MD;  Location: [place] [facility];  Service: Cardiovascular;  Laterality: N/A;   COLONOSCOPY WITH PROPOFOL N/A 07/08/2016   Procedure: COLONOSCOPY WITH PROPOFOL;  Surgeon: [name], MD;  Location: [facility];  Service: Endoscopy;  Laterality:  N/A;   COLONOSCOPY WITH PROPOFOL N/A 08/20/2017   Procedure: COLONOSCOPY WITH PROPOFOL;  Surgeon:  [name], MD;  Location: [facility] END[facility];  Service: Gastroenterology;  Laterality: N/A;   colonscopy     CORONARY ANGIOPLASTY  05/28/2014   stent placement to the mid RCA   CYSTOURETHROSCOPY     [facility] with stent placement 11 or 05/2019 and stent removal 06/13/19    DILATION AND CURETTAGE OF UTERUS     several in the early 80's   ESOPHAGOGASTRODUODENOSCOPY     2012   ESOPHAGOGASTRODUODENOSCOPY (EGD) WITH PROPOFOL N/A 03/11/2018   Procedure: ESOPHAGOGASTRODUODENOSCOPY (EGD) WITH PROPOFOL;  Surgeon: [name], MD;  Location: [facility] ENDOSCOPY;  Service: Gastroenterology;  Laterality: N/A;   EYE SURGERY     bilateral laser 2012   EYE SURGERY     right   EYE SURGERY     x4 both eyes   GAS INSERTION  09/30/2011   Procedure: INSERTION OF GAS;  Surgeon: [name], MD;  Location: [place];  Service: Ophthalmology;  Laterality: Right;  [id number]   GAS/FLUID EXCHANGE  09/30/2011   Procedure: GAS/FLUID EXCHANGE;  Surgeon: [name], MD;  Location: [place];  Service: Ophthalmology;  Laterality: Right;   KIDNEY TRANSPLANT     03/30/19 Dr. Mickel Baas [facility]/Dr. Cristie Hem Zendel/Dr. Gilford Raid; [facility] transplant Dr. Horald Chestnut will be primary [phone] coordinator Sarah Wynkoop [phone]   KIDNEY TRANSPLANT     03/30/19 [facility]   LEFT HEART CATHETERIZATION WITH CORONARY ANGIOGRAM N/A 05/28/2013   Procedure: LEFT HEART CATHETERIZATION WITH CORONARY ANGIOGRAM;  Surgeon: [name], MD;  Location: [facility] CATH LAB;  Service: Cardiovascular;  Laterality: N/A;   PARS PLANA VITRECTOMY  04/22/2011   Procedure: PARS PLANA VITRECTOMY WITH 25 GAUGE;  Surgeon: [name], MD;  Location: [place];  Service: Ophthalmology;  Laterality: Left;  membrane peel, endolaser, gas fluid exchange, silicone oil, repair of complex traction retinal detachment   PARS PLANA VITRECTOMY  09/30/2011   Procedure: PARS PLANA VITRECTOMY WITH 25 GAUGE;  Surgeon: [name], MD;  Location: [place];  Service: Ophthalmology;  Laterality:  Right;  Endolaser; Repair of Complex Traction Retinal Detachment   PARS PLANA VITRECTOMY  02/24/2012   Procedure: PARS PLANA VITRECTOMY WITH 25 GAUGE;  Surgeon: [name], MD;  Location: [place];  Service: Ophthalmology;  Laterality: Left;   PTCA     SILICON OIL REMOVAL  06/11/7251   Procedure: SILICON OIL REMOVAL;  Surgeon: [name], MD;  Location: [place];  Service: Ophthalmology;  Laterality: Left;   THROMBECTOMY / ARTERIOVENOUS GRAFT REVISION     TUBAL LIGATION     1979   UPPER EXTREMITY ANGIOGRAPHY Left 02/08/2019   Procedure: UPPER EXTREMITY ANGIOGRAPHY;  Surgeon: Katha Cabal, MD;  Location: [place] [facility];  Service: Cardiovascular;  Laterality: Left;    Current Outpatient Medications  Medication Sig Dispense Refill   acetaminophen (TYLENOL) 500 MG tablet Take 1,000 mg by mouth daily as needed.     albuterol (PROVENTIL) (2.5 MG/3ML) 0.083% nebulizer solution Take 3 mLs (2.5 mg total) by nebulization every 6 (six) hours as needed for wheezing or shortness of breath. 75 mL 11   albuterol (VENTOLIN HFA) 108 (90 Base) MCG/ACT inhaler Inhale 1-2 puffs into the lungs every 6 (six) hours as needed for wheezing or shortness of breath. 1 each 12   amLODipine (NORVASC) 10 MG tablet Take 1 tablet (10 mg total) by mouth daily.  30 tablet 1   atorvastatin (LIPITOR) 20 MG tablet Take 20 mg by mouth daily.     carvedilol (COREG) 12.5 MG tablet Take 1 tablet (12.5 mg total) by mouth 2 (two) times daily. 90 tablet 3   cloNIDine (CATAPRES) 0.1 MG tablet Take 1 tablet (0.1 mg total) by mouth 2 (two) times daily. 60 tablet 11   cloNIDine (CATAPRES) 0.1 MG tablet Take 0.2 mg by mouth 2 (two) times daily.     esomeprazole (NEXIUM) 20 MG capsule Take 20 mg by mouth daily.     furosemide (LASIX) 20 MG tablet Take 1 tablet (20 mg total) by mouth daily as needed (shortness of breath). 30 tablet 0   loratadine (CLARITIN) 10 MG tablet Take 10 mg by mouth daily.     magnesium oxide (MAG-OX) 400  MG tablet Take 400 mg by mouth 2 (two) times daily.     nitroGLYCERIN (NITROSTAT) 0.4 MG SL tablet Place 1 tablet (0.4 mg total) under the tongue every 5 (five) minutes as needed for chest pain. 25 tablet 3   pregabalin (LYRICA) 75 MG capsule Take 1 capsule (75 mg total) by mouth 2 (two) times daily. 60 capsule 5   sodium bicarbonate 650 MG tablet Take 650 mg by mouth 2 (two) times daily.     Tacrolimus ER 4 MG TB24 Take 8 mg by mouth every morning.     Vitamin D, Ergocalciferol, (DRISDOL) 1.25 MG (50000 UNIT) CAPS capsule Take 50,000 Units by mouth every 7 (seven) days. Mondays     oxyCODONE-acetaminophen (PERCOCET/ROXICET) 5-325 MG tablet Take 1 tablet by mouth 2 (two) times daily as needed for severe pain. (Patient not taking: Reported on 01/11/2021) 60 tablet 0   Current Facility-Administered Medications  Medication Dose Route Frequency Provider Last Rate Last Admin   hydrALAZINE (APRESOLINE) tablet 25 mg  25 mg Oral Once Mickle Plumb, Jacquelyn D, PA-C         Allergies:   Hydrocodone and Lisinopril   Social History:  The patient  reports that she quit smoking about 10 years ago. Her smoking use included cigarettes. She has a 25.00 pack-year smoking history. She has never used smokeless tobacco. She reports that she does not drink alcohol and does not use drugs.   Family History:   family history includes Breast cancer in her sister; Colon cancer in her father and sister; Diabetes in her brother, brother, and daughter; Heart attack in her brother and mother; Heart disease in her mother; Kidney failure in her daughter; Renal Disease in her daughter; Stroke in her brother and mother.    Review of Systems: Review of Systems  Constitutional: Negative.   HENT: Negative.    Respiratory: Negative.    Cardiovascular: Negative.   Gastrointestinal: Negative.   Musculoskeletal: Negative.   Neurological: Negative.   Psychiatric/Behavioral: Negative.    All other systems reviewed and are  negative.  PHYSICAL EXAM: VS:  BP (!) 202/66 (BP Location: Right Arm, Patient Position: Sitting, Cuff Size: Normal)   Pulse 64   Ht 5' 7.5" (1.715 m)   Wt 158 lb (71.7 kg)   SpO2 98%   BMI 24.38 kg/m  , BMI Body mass index is 24.38 kg/m. Constitutional:  oriented to person, place, and time. No distress.  HENT:  Head: Grossly normal Eyes:  no discharge. No scleral icterus.  Neck: No JVD, no carotid bruits  Cardiovascular: Regular rate and rhythm, no murmurs appreciated Pulmonary/Chest: Clear to auscultation bilaterally, no wheezes or rails Abdominal: Soft.  no distension.  no tenderness.  Musculoskeletal: Normal range of motion Neurological:  normal muscle tone. Coordination normal. No atrophy Skin: Skin warm and dry Psychiatric: normal affect, pleasant  Recent Labs: 04/18/2020: B Natriuretic Peptide 657.1 04/20/2020: Hemoglobin 8.5; Platelets 174 04/21/2020: BUN 23; Creatinine, Ser 1.39; Potassium 4.6; Sodium 134 12/04/2020: ALT 8; TSH 0.96    Lipid Panel Lab Results  Component Value Date   CHOL 161 05/22/2016   HDL 59 05/22/2016   LDLCALC 89 05/22/2016   TRIG 65 05/22/2016      Wt Readings from Last 3 Encounters:  01/11/21 158 lb (71.7 kg)  12/07/20 156 lb (70.8 kg)  12/04/20 155 lb 12.8 oz (70.7 kg)     ASSESSMENT AND PLAN:  Essential (primary) hypertension -  Recommend she add extra clonidine 0.1 mg at middle of the day in addition to her newly increase clonidine 0.2 twice daily Also given her prescription for hydralazine 50 mg 3 times daily as needed Suggested she stay on her other medications including amlodipine 10 daily, carvedilol 12.5 twice daily  Coronary artery disease involving native coronary artery of native heart without angina pectoris - Plan: EKG 12-Lead Currently with no symptoms of angina. No further workup at this time. Continue current medication regimen.  Mitral valve insufficiency, unspecified etiology - Previous echocardiogram September  2017 with moderate to severe MR Likely dynamic MR, will change with fluid status from hemodialysis Moderate on recent echo Stressed importance of aggressive blood pressure control  ESRD, on hemodialysis ([place]) Off HD Had transplant Reports things are stable Recommended we need aggressive blood pressure control, medication compliance  Diabetic polyneuropathy associated with type 2 diabetes mellitus (HCC) HBA1C well controlled Off medications  S/P coronary artery stent placement Denies anginal symptoms  Carotid stenosis 40-59% bilateral disease Quit smoking, 2014 Periodic carotid u/s   Total encounter time more than 25 minutes  Greater than 50% was spent in counseling and coordination of care with the patient   No orders of the defined types were placed in this encounter.    Signed, Esmond Plants, M.D., Ph.D. 01/11/2021  [facility] [place], [place]

## 2021-01-10 NOTE — Unmapped (Signed)
Transplant Coordinator, Clinic Visit   Pt seen today by transplant nephrology for follow up, reviewed medications and symptoms.          01/10/21 1035   BP: 213/63   Pulse: 70   Temp: 36.2 ??C (97.2 ??F)   Weight: 73.3 kg (161 lb 9.6 oz)   Height: 171.5 cm (5' 7.5)   PainSc:   7   PainLoc: Generalized     Patient's BP in clinic today is 213/63.  Upon rechecking it, 30 minutes later with a manual cuff, down to 168/53.  Patient reports occasional lightheadedness with standing.  Patient reports she checks blood glucose at home and it is normal.  She says she takes no meds for this.  Patient reports she took Envarsus yesterday at 0900.  She received [employer] today.  She reports chronic arthritis, mainly of her back and right lower extremity neuropathy.  She takes Lyrica for this.  Per Dr Margaretmary Bayley, increased Clonodine to 0.2, Envarsus to 9 mg, restarted Myfortic today.  She will need to follow up with PCP to get scheduled with her local hospital for a EGD and Colonoscopy.     Assessment  BP: in clinic today first reading is 213/63.  Rechecked in 30 minutes with manual cuff, 168/53  Lightheaded: endorses with standing  BG: checks at home, pt reports it runs 140's systolic  Headache: denies  Hand tremors: denies  Numbness/tingling: denies  Fevers: denies  Chills/sweats: denies  Shortness of breath: denies  Chest pain or pressure: denies  Palpitations: denies  Abdominal pain: denies  Heart burn: denies  Nausea/vomiting: denies  Diarrhea/constipation: denies  UTI symptoms: denies  Swelling: denies  Sleep: trouble sleeping every night, stopped taking Melatonin be  Pain: reports history of arthritis pain      Good appetite; reports adequate hydration.     Intake: adequate  Output: adequate    Any new medications? none  Immunosuppressant last taken: Envarsus    Immunization status: takes Envarsus, increased to 9 mg    Functional Score: 100   Normal no complaints; no evidence of  disease.      I spent a total of 20 minutes with [name] reviewing medications and symptoms.

## 2021-01-10 NOTE — Unmapped (Signed)
AOBP:Right  arm  medium cuff   Average:213/63  Pulse:70  1st reading:211/62  Pulse:69  2nd reading:216/63  Pulse:70  3rd reading:211/63  Pulse:71    MD has been notified.    Manual BP: 178/52 Right Arm, Medium Cuff  Machine BP:168/53 Right Arm, Medium Cuff

## 2021-01-10 NOTE — Unmapped (Signed)
Pt received Covid-19 booster dose [employer]). Observed pt for 15 mins, pt displayed no signs of allergic reactions and no c/o of discomfort. Pt is aware if any changes occur to seek medical attention.

## 2021-01-10 NOTE — Unmapped (Signed)
Transplant Nephrology Clinic Visit    Assessment/Plan:  Michele Cisneros is a 65 y.o. female s/p deceased donor kidney transplant on 03/30/2019 for native kidney ESRD presumed secondary to diabetic nephropathy. Active medical issues include:      S/P deceased donor kidney transplant with no history of rejection or DSAs.   - Serum creatinine is 1.62 with recent values in the range of 1.5-1.8 mg/dL. Notably, her baseline prior to 04/11/20 was  0.9-1.4 mg/dL.   - The modest rise in creatinine occurred in the setting of reduced PO intake. These symptoms are now resolved.   - Tacrolimus levels have been highly variable during periods of active then improving GI symptoms. - DSA screens have been negative (most recently 09/12/20)   - UP/C is mildly elevated at 0.366 and was previously normal, UA is otherwise unremarkable  - BK viral load undetectable 09/12/20.   - In the absence of proteinuria or DSAs I do not feel a kidney biopsy is currently warranted.     Immunosuppression Management   - Tacrolimus level is 4.5 (target 4-7)    - Will increase Envarsus XR from 8 mg daily to 9 mg daily.   - Myfortic 360 mg bi will be resumed today now that leukopenia and GI symptoms have resolved.      History of post-transplant UTI:   - UA today not suggestive of UTI and she has no UTI symptoms  - Proteus isolated on 11/28/19, 12/26/19,02/27/20 and 03/19/20 with citrobacter also isolated 03/19/20  - Needs renal US of native and transplant kidneys to assure no stones in view of proteus isolates.    History of CMV viremia  - Viral load negative 09/12/20, pending today   - On no therapy currently.    Hypertension, with postural hypotension and syncope   - BP today in clinic 168/73   - Home BP has been 140's/50's  - Postural dizziness has improved    - The balance between symptomatic postural hypotension and recumbent or seated HTN remains challenging.   - Will continue amlodipine 5 mg daily and increase clonidine to 0.2 mg bid from 0.1 mg bid. Coronary artery disease, s/p RCA stent 2015, history of HFpEF secondary to diastolic dysfunction/mitral regurgitation (moderate to severe)  - no symptoms of cardiac ischemia recently   - Atorvastatin dose has been limited to 20 mg daily due to myopathy.  - Continue aspirin.   - DOE may be due to valvular heart disease.   - Echocardiogram 09/12/20 with moderate MR, mild to moderate aortic regurgitation and EF 60-65%.    - She will follow up with Cardiology tomorrow    Esophageal stenosis, history of esophageal ulcers:   - S/P dilation 07/06/19 with resolution of ulcers on EGD compared to 05/04/19 EGD  - She remains on Nexium  - She needs repeat EGD on same date as planned colonoscopy.    Pulmonary Nodules with History of Smoking Associated COPD:   - Chest CT 09/12/20 revealed stable pulmonary nodules compared to 05/02/19  - Will continue annual lung cancer screening low dose CT    Lumbar Radiculopathy Secondary to L5-S1 disc disease Peripheral Neuropathy secondary to DM   - Following with [facility]  - Continue Lyrica.    Thyroid Nodule:   - [place] 02/01/20 revealed no nodules suggestive of cancer     History of DM, type II, controlled without current therapy:  - A1C 5.0 on 09/12/20.   - Metformin previously stopped due to intermittent diarrhea and  excellent control    Mild Thrombocytopenia  - Platelet count 113,000 today consistent with recent values  - No treatment necessary  - SPEP, immunoglobulin free light chains to be checked at next [facility] lab draw.    Health Maintenance:   Mammogram: [facility] 12/11/20 revealed probable benign calcifications with plans for follow up in 6 months. Chest CT (09/12/20) with asymmetry of breast tissue  Pap Smear: s/p hysterectomy  Colonoscopy: repeat study now due based on history of multiple tubular adenomas, will plan EGD at same time  Lung cancer screening: annual low dose chest CT, last study 09/12/20   Renal US: due    Immunizations/Infection Prevention:   - Flu: 04/04/20  - Prevnar 13: not noted in records.    - Pneumovax: 03/23/18  - COVID-19: Patient has completed 3 doses of COVID-19 vaccine. Will give dose #4 today (01/10/21) with plan for repeat in 4 months. She is aware of limited efficacy of the vaccine.   - SARS-CoV-2 spike antibody is negative. Will plan Evusheld 2 weeks after today's COVID booster     Follow-Up:   - Return to clinic in 3 months.   - Patient still needs EGD and colonoscopy and wishes these to be done locally.  - Evusheld in 2 weeks.     Interval History:   She presents today with primary complaint of back pain and right hip pain. She has been under stress at home with the need to move due to changing rent costs.  She continues to have post-prandial abdominal discomfort despite esomeprazole. Diarrhea has improved. She is still not on Myfortic. Her weight has increased to 161 lbs from 148 at her last visit. She denies dysuria, allograft tenderness, fever or chills. She stopped all antihypertensives and had experienced less postural dizziness. When home BP increased she has since resumed amlodipine 5 mg daily and clonidine 0.1 mg bid. She continues to have peripheral neuropathy pain and lower extremity weakness and is still on Lyrica. Hand tremors are persistent, but mild. She notes only infrequent  headaches.      She has completed 3 doses of COVID-19 vaccine and had her flu vaccine. She continues to smoke.    Transplant History:    Native Kidney Disease: Diabetic nephropathy   Pre-transplant PRA: 70%   Organ Received: DDKT, DBD, KDPI: 70%  CMV/EBV Status: CMV D-/R+  EBV D+/R+  Resp culture: + strep pneumo  Date of Transplant: 03/30/2019  Implantation biopsy: mild ATN, otherwise normal.  Post-transplant complication - DGF  Induction: Campath with rapid steroid discontinuation  Maintenance Immunosuppression on discharge: Tacrolimus/Myfotic  Date of Ureteral Stent Removal: 06/13/2019  Current Immunosuppression: Tacrolimus monotherapy    Other Past Medical History  1. Diabetes mellitus, type 2, diagnosed 2012.   2. Diabetic peripheral neuropathy  3. Diabetic retinopathy, s/p laser surgery, s/p retinal detachment  4. Hypertension, diagnosed 2012.   5. Mixed hyperlipidemia.   6. Coronary artery disease, s/p NSTEMI 05/2013, PCI RCA, 20% KNm 80% PDA 1. Cardiac cath 05/02/15 LAD 20% and 30% lesions, Cx 35%, RPDA 70%, patent right coronary stent  7. HFpEF with EF 65-70% on echo with moderate MR and mild to moderate AI on 09/12/20.  8. COPD, emphysema secondary to smoking  9. Peripheral artery disease  10. Carotid artery stenosis 1-39% bilateral stenosis 12/2017  10. Sickle Cell Trait  11. S/P TAH/BSO 2000  12. Lumbar radiculopathy secondary to L5-S1 disc bulge   13. History of thyroid nodule  14. Colonic polyps removed 08/10/17,  pathology tubular adenomas,  follow up in 3 years  15. Right breast soft tissue mass noted 07/07/17 stable in appearance compared to past imaging   16. Pulmonary nodule noted 07/07/17, 11.23/20, and 09/12/20 similar in size to lesion as far back as 2015.   17. OSA on CPAP    Review of Systems    Otherwise as per HPI, all other systems reviewed and are negative.    Medications  Current Outpatient Medications   Medication Sig Dispense Refill   ??? acetaminophen (TYLENOL) 500 MG tablet Take 1-2 tablets (500-1,000 mg total) by mouth every six (6) hours as needed for pain or fever (> 38C or 100.46F). 30 tablet 0   ??? albuterol (PROVENTIL HFA;VENTOLIN HFA) 90 mcg/actuation inhaler Inhale 2 puffs every six (6) hours as needed for wheezing.     ??? amLODIPine (NORVASC) 5 MG tablet Take 1 tablet (5 mg total) by mouth daily. 30 tablet 11   ??? aspirin (ECOTRIN) 81 MG tablet Take 81 mg by mouth daily.     ??? atorvastatin (LIPITOR) 20 MG tablet Take 1 tablet (20 mg total) by mouth daily. 90 tablet 3   ??? blood sugar diagnostic (CONTOUR TEST STRIPS) Strp by Other route two (2) times a day. TEST BLOOD SUGARS 2 TIMES DAILY 60 strip 5   ??? blood-glucose meter (ONETOUCH ULTRA SYSTEM KIT) kit Please dispense for patient to use at home to check blood sugars.     ??? carvediloL (COREG) 6.25 MG tablet Take 1 tablet (6.25 mg total) by mouth Two (2) times a day. (Patient not taking: Reported on 09/12/2020) 180 tablet 3   ??? ergocalciferol-1,250 mcg, 50,000 unit, (VITAMIN D2-1,250 MCG, 50,000 UNIT,) 1,250 mcg (50,000 unit) capsule Take 1 capsule (1,250 mcg total) by mouth once a week. Take for 8 weeks as directed. 4 capsule 2   ??? esomeprazole (NEXIUM) 20 MG capsule Take 1 capsule (20 mg total) by mouth daily. 90 capsule 3   ??? LANCETS,THIN MISC Using at home to test blood sugars twice daily.     ??? magnesium oxide (MAG-OX) 400 mg (241.3 mg elemental magnesium) tablet Take 1 tablet (400 mg total) by mouth Two (2) times a day. 120 tablet 11   ??? melatonin 3 mg Tab Take 2 tablets (6 mg total) by mouth nightly as needed. (Patient not taking: Reported on 09/12/2020)  0   ??? mycophenolate (MYFORTIC) 180 MG EC tablet HOLD (Patient not taking: Reported on 02/15/2020) 120 tablet 11   ??? nitroglycerin (NITROSTAT) 0.4 MG SL tablet Place 0.4 mg under the tongue daily as needed.     ??? pregabalin (LYRICA) 75 MG capsule Take 1 capsule (75 mg total) by mouth every morning AND 2 capsules (150 mg total) nightly. 90 capsule 3   ??? sodium bicarbonate 650 mg tablet Take 1 tablet (650 mg total) by mouth Two (2) times a day. 60 tablet 11   ??? tacrolimus (ENVARSUS XR) 4 mg Tb24 extended release tablet Take 2 tablets (8 mg total) by mouth in the morning. 60 tablet 11   ??? traMADoL (ULTRAM) 50 mg tablet Take 1 tablet (50 mg total) by mouth every eight (8) hours as needed for pain. (Patient not taking: Reported on 09/12/2020) 30 tablet 0     No current facility-administered medications for this visit.           Physical Exam  BP 168/53 (BP Site: R Arm, BP Position: Sitting, BP Cuff Size: Medium)  - Pulse 68  - Temp  36.2 ??C (97.2 ??F) (Temporal)  - Ht 171.5 cm (5' 7.5)  - Wt 73.3 kg (161 lb 9.6 oz)  - BMI 24.94 kg/m??   General: Patient is a pleasant female in no apparent distress.  Eyes: Sclera anicteric.  Neck: Supple without LAD/JVD/bruits.  Lungs: Clear to auscultation bilaterally, no wheezes/rales/rhonchi.  Cardiovascular: grade 2/6 systolic murmur.  Abdomen: Soft, notender/nondistended. Positive bowel sounds. No hepatosplenomegaly, masses or bruits appreciated.  Extremities: 1+ edema  Skin: Without rash  Neurological: ataxic gait, reduced strength in legs, symmetric.  Psychiatric: Mood and affect appropriate.    Laboratory Data and Imaging Reviewed

## 2021-01-11 ENCOUNTER — Encounter: Payer: Self-pay | Admitting: Cardiovascular Disease

## 2021-01-11 ENCOUNTER — Other Ambulatory Visit: Payer: Self-pay

## 2021-01-11 ENCOUNTER — Ambulatory Visit: Payer: Medicare Other | Admitting: Cardiovascular Disease

## 2021-01-11 VITALS — BP 202/66 | HR 64 | Ht 67.5 in | Wt 158.0 lb

## 2021-01-11 DIAGNOSIS — I25118 Atherosclerotic heart disease of native coronary artery with other forms of angina pectoris: Secondary | ICD-10-CM | POA: Diagnosis not present

## 2021-01-11 DIAGNOSIS — N186 End stage renal disease: Secondary | ICD-10-CM | POA: Diagnosis not present

## 2021-01-11 DIAGNOSIS — E785 Hyperlipidemia, unspecified: Secondary | ICD-10-CM | POA: Diagnosis not present

## 2021-01-11 DIAGNOSIS — E1122 Type 2 diabetes mellitus with diabetic chronic kidney disease: Secondary | ICD-10-CM

## 2021-01-11 DIAGNOSIS — N183 Chronic kidney disease, stage 3 unspecified: Secondary | ICD-10-CM

## 2021-01-11 DIAGNOSIS — I739 Peripheral vascular disease, unspecified: Secondary | ICD-10-CM

## 2021-01-11 DIAGNOSIS — Z794 Long term (current) use of insulin: Secondary | ICD-10-CM

## 2021-01-11 DIAGNOSIS — I351 Nonrheumatic aortic (valve) insufficiency: Secondary | ICD-10-CM

## 2021-01-11 DIAGNOSIS — I10 Essential (primary) hypertension: Secondary | ICD-10-CM | POA: Diagnosis not present

## 2021-01-11 DIAGNOSIS — I340 Nonrheumatic mitral (valve) insufficiency: Secondary | ICD-10-CM | POA: Diagnosis not present

## 2021-01-11 DIAGNOSIS — I5032 Chronic diastolic (congestive) heart failure: Secondary | ICD-10-CM

## 2021-01-11 DIAGNOSIS — I6523 Occlusion and stenosis of bilateral carotid arteries: Secondary | ICD-10-CM | POA: Diagnosis not present

## 2021-01-11 MED ORDER — AMLODIPINE BESYLATE 10 MG PO TABS: 10.0000 mg | ORAL_TABLET | Freq: Every day | ORAL | 3 refills | Status: DC

## 2021-01-11 MED ORDER — ATORVASTATIN CALCIUM 20 MG PO TABS: 20.0000 mg | ORAL_TABLET | Freq: Every day | ORAL | 3 refills | Status: DC

## 2021-01-11 MED ORDER — HYDRALAZINE HCL 50 MG PO TABS: 50.0000 mg | ORAL_TABLET | Freq: Three times a day (TID) | ORAL | 3 refills | Status: DC | PRN

## 2021-01-11 MED ORDER — CLONIDINE HCL 0.2 MG PO TABS: 0.2000 mg | ORAL_TABLET | Freq: Two times a day (BID) | ORAL | 1 refills | Status: DC

## 2021-01-11 NOTE — Patient Instructions (Addendum)
Medication Instructions:  Increase the clonidine up to 0.2 mg twice a day Take an extra 0.1 mg clonidine for blood pressure >160  START hydralazine 50 mg  up to three times a day as needed (lasts 4-6 hours)  If you need a refill on your cardiac medications before your next appointment, please call your pharmacy.   Lab work: No new labs needed  Testing/Procedures: No new testing needed  Follow-Up: At [facility], you and your health needs are our priority.  As part of our continuing mission to provide you with exceptional heart care, we have created designated Provider Care Teams.  These Care Teams include your primary Cardiologist (physician) and Advanced Practice Providers (APPs -  Physician Assistants and Nurse Practitioners) who all work together to provide you with the care you need, when you need it.  You will need a follow up appointment in 6 months  Providers on your designated Care Team:   Murray Hodgkins, NP Christell Faith, PA-C Marrianne Mood, PA-C Cadence [place], [place]  COVID-19 Vaccine Information can be found at: ShippingScam.co.uk For questions related to vaccine distribution or appointments, please email vaccine@[email or web address] .[email or web address] or call [phone].

## 2021-01-12 LAB — CMV DNA, QUANTITATIVE, PCR: CMV VIRAL LD: NOT DETECTED

## 2021-01-14 DIAGNOSIS — Z940 Kidney transplant status: Principal | ICD-10-CM

## 2021-01-17 MED FILL — ENVARSUS XR 4 MG TABLET,EXTENDED RELEASE: ORAL | 30 days supply | Qty: 60 | Fill #1

## 2021-01-17 MED FILL — SODIUM BICARBONATE 650 MG TABLET: ORAL | 30 days supply | Qty: 60 | Fill #9

## 2021-01-18 DIAGNOSIS — Z940 Kidney transplant status: Principal | ICD-10-CM

## 2021-01-18 MED ORDER — TACROLIMUS XR 1 MG TABLET,EXTENDED RELEASE 24 HR
ORAL_TABLET | Freq: Every day | ORAL | 11 refills | 30 days | Status: CP
Start: 2021-01-18 — End: 2022-01-18

## 2021-01-18 MED ORDER — TACROLIMUS XR 4 MG TABLET,EXTENDED RELEASE 24 HR
ORAL_TABLET | Freq: Every day | ORAL | 11 refills | 30.00000 days | Status: CP
Start: 2021-01-18 — End: ?
  Filled 2021-01-18: qty 30, 30d supply, fill #0

## 2021-01-18 NOTE — Unmapped (Signed)
[facility] Pharm[facility] Refill Coordination Note    Specialty Medication(s) to be Shipped:   Transplant: Envarsus 1mg     Other medication(s) to be shipped: No additional medications requested for fill at this time     Michele Cisneros, DOB: 1955/08/15  Phone: [phone] (home)       All above HIPAA information was verified with patient.     Was a Nurse, learning disability used for this call? No    Completed refill call assessment today to schedule patient's medication shipment from the [facility] [phone]).  All relevant notes have been reviewed.     Specialty medication(s) and dose(s) confirmed: Patient reports changes to the regimen as follows: Envarsus is now 9mg  daily **sent rf request**   Changes to medications: Michele Cisneros reports no changes at this time.  Changes to insurance: No  New side effects reported not previously addressed with a pharmacist or physician: None reported  Questions for the pharmacist: No    Confirmed patient received a Conservation officer, historic buildings and a Surveyor, mining with first shipment. The patient will receive a drug information handout for each medication shipped and additional FDA Medication Guides as required.       DISEASE/MEDICATION-SPECIFIC INFORMATION        N/A    SPECIALTY MEDICATION ADHERENCE     Medication Adherence    Patient reported X missed doses in the last month: 0  Specialty Medication: Envarsus 9mg   Patient is on additional specialty medications: No        Were doses missed due to medication being on hold? No    Envarsu 1 mg: 3 days of medicine on hand     REFERRAL TO PHARMACIST     Referral to the pharmacist: Not needed      [facility]     Shipping address confirmed in Epic.     Delivery Scheduled: Yes, Expected medication delivery date: 01/18/2021.  However, Rx request for refills was sent to the provider as there are none remaining.     Medication will be delivered via Same Day Courier to the prescription address in Epic [place].    [name]   [facility] Specialty Technician

## 2021-01-18 NOTE — Unmapped (Addendum)
[facility] Pharmacist has reviewed this new prescription.  Patient was counseled on this dosage change by RD- see epic note from 01/10/21.  Next refill call date adjusted if necessary.       Clinical Assessment Needed For: Dose Change  Medication: Envarsus XR 4mg  tablet  Last Fill Date/Day Supply: 01/17/2021 / 30 days  Refill Too Soon until 02/09/2021  Was previous dose already scheduled to fill: No    Notes to Pharmacist: N/A      Clinical Assessment Needed For: Dose Change  Medication: Envarsus XR 1mg  tablet  Last Fill Date/Day Supply: 11/01/2020 / 30 days  Copay $39.45  Was previous dose already scheduled to fill: Yes    Notes to Pharmacist: Scheduled to fill TODAY 08/12. Appears pt was made aware on 08/04

## 2021-01-22 DIAGNOSIS — Z940 Kidney transplant status: Secondary | ICD-10-CM | POA: Diagnosis not present

## 2021-01-23 LAB — CBC W/ DIFFERENTIAL
BANDED NEUTROPHILS ABSOLUTE COUNT: 0 10*3/uL (ref 0.0–0.1)
BASOPHILS ABSOLUTE COUNT: 0.1 10*3/uL (ref 0.0–0.2)
BASOPHILS RELATIVE PERCENT: 1 %
EOSINOPHILS ABSOLUTE COUNT: 0.2 10*3/uL (ref 0.0–0.4)
EOSINOPHILS RELATIVE PERCENT: 4 %
HEMATOCRIT: 36.4 % (ref 34.0–46.6)
HEMOGLOBIN: 11.9 g/dL (ref 11.1–15.9)
IMMATURE GRANULOCYTES: 0 %
LYMPHOCYTES ABSOLUTE COUNT: 1.1 10*3/uL (ref 0.7–3.1)
LYMPHOCYTES RELATIVE PERCENT: 17 %
MEAN CORPUSCULAR HEMOGLOBIN CONC: 32.7 g/dL (ref 31.5–35.7)
MEAN CORPUSCULAR HEMOGLOBIN: 30.6 pg (ref 26.6–33.0)
MEAN CORPUSCULAR VOLUME: 94 fL (ref 79–97)
MONOCYTES ABSOLUTE COUNT: 0.4 10*3/uL (ref 0.1–0.9)
MONOCYTES RELATIVE PERCENT: 6 %
NEUTROPHILS ABSOLUTE COUNT: 4.6 10*3/uL (ref 1.4–7.0)
NEUTROPHILS RELATIVE PERCENT: 72 %
PLATELET COUNT: 136 10*3/uL — ABNORMAL LOW (ref 150–450)
RED BLOOD CELL COUNT: 3.89 x10E6/uL (ref 3.77–5.28)
RED CELL DISTRIBUTION WIDTH: 13.6 % (ref 11.7–15.4)
WHITE BLOOD CELL COUNT: 6.4 10*3/uL (ref 3.4–10.8)

## 2021-01-23 LAB — BASIC METABOLIC PANEL
BLOOD UREA NITROGEN: 33 mg/dL — ABNORMAL HIGH (ref 8–27)
BUN / CREAT RATIO: 19 (ref 12–28)
CALCIUM: 10 mg/dL (ref 8.7–10.3)
CHLORIDE: 107 mmol/L — ABNORMAL HIGH (ref 96–106)
CO2: 18 mmol/L — ABNORMAL LOW (ref 20–29)
CREATININE: 1.71 mg/dL — ABNORMAL HIGH (ref 0.57–1.00)
EGFR: 33 mL/min/{1.73_m2} — ABNORMAL LOW
GLUCOSE: 176 mg/dL — ABNORMAL HIGH (ref 65–99)
POTASSIUM: 5.7 mmol/L — ABNORMAL HIGH (ref 3.5–5.2)
SODIUM: 138 mmol/L (ref 134–144)

## 2021-01-23 LAB — PHOSPHORUS: PHOSPHORUS, SERUM: 4.3 mg/dL (ref 3.0–4.3)

## 2021-01-23 LAB — MAGNESIUM: MAGNESIUM: 2.5 mg/dL — ABNORMAL HIGH (ref 1.6–2.3)

## 2021-01-24 LAB — TACROLIMUS LEVEL: TACROLIMUS BLOOD: 5.5 ng/mL (ref 2.0–20.0)

## 2021-01-28 DIAGNOSIS — Z940 Kidney transplant status: Principal | ICD-10-CM

## 2021-02-04 NOTE — Unmapped (Signed)
Pt wants to cancel her appointment for tomorrow with Dr. Linton Rump

## 2021-02-05 ENCOUNTER — Ambulatory Visit: Admit: 2021-02-05 | Payer: MEDICARE

## 2021-02-07 DIAGNOSIS — Z940 Kidney transplant status: Principal | ICD-10-CM

## 2021-02-11 DIAGNOSIS — Z940 Kidney transplant status: Principal | ICD-10-CM

## 2021-02-12 NOTE — Unmapped (Signed)
[facility] Refill Coordination Note    Specialty Medication(s) to be Shipped:   Transplant: Envarsus 1mg  and Envarsus 4mg     Other medication(s) to be shipped: Magnesium, Vitamin D, Lyrica and Sodium Bicarbonate.     Michele Cisneros, DOB: April 04, 1956  Phone: [phone] (home)       All above HIPAA information was verified with patient.     Was a Nurse, learning disability used for this call? No    Completed refill call assessment today to schedule patient's medication shipment from the [facility] [phone]).  All relevant notes have been reviewed.     Specialty medication(s) and dose(s) confirmed: Regimen is correct and unchanged.   Changes to medications: Antwan reports no changes at this time.  Changes to insurance: No  New side effects reported not previously addressed with a pharmacist or physician: None reported  Questions for the pharmacist: No    Confirmed patient received a Conservation officer, historic buildings and a Surveyor, mining with first shipment. The patient will receive a drug information handout for each medication shipped and additional FDA Medication Guides as required.       DISEASE/MEDICATION-SPECIFIC INFORMATION        N/A    SPECIALTY MEDICATION ADHERENCE     Medication Adherence    Patient reported X missed doses in the last month: 0  Specialty Medication: Envarsus 1mg   Patient is on additional specialty medications: Yes  Additional Specialty Medications: Envarsus 4mg   Patient Reported Additional Medication X Missed Doses in the Last Month: 0  Patient is on more than two specialty medications: No  Informant: patient              Were doses missed due to medication being on hold? No    Envarsus 1 mg: 10 days of medicine on hand   Envarsus 4 mg: 11 days of medicine on hand        REFERRAL TO PHARMACIST     Referral to the pharmacist: Not needed      [facility]     Shipping address confirmed in Epic.     Delivery Scheduled: Yes, Expected medication delivery date: 02/15/21.     Medication will be delivered via Next Day Courier to the prescription address in Epic [place].    Wyatt Mage M Elisabeth Cara   [facility] Specialty Technician

## 2021-02-14 ENCOUNTER — Other Ambulatory Visit: Payer: Self-pay

## 2021-02-14 ENCOUNTER — Encounter (INDEPENDENT_AMBULATORY_CARE_PROVIDER_SITE_OTHER): Payer: Self-pay

## 2021-02-14 ENCOUNTER — Encounter: Payer: Self-pay | Admitting: Podiatry

## 2021-02-14 ENCOUNTER — Ambulatory Visit (INDEPENDENT_AMBULATORY_CARE_PROVIDER_SITE_OTHER): Payer: Medicare Other | Admitting: Podiatry

## 2021-02-14 DIAGNOSIS — M79675 Pain in left toe(s): Secondary | ICD-10-CM | POA: Diagnosis not present

## 2021-02-14 DIAGNOSIS — E1142 Type 2 diabetes mellitus with diabetic polyneuropathy: Secondary | ICD-10-CM

## 2021-02-14 DIAGNOSIS — I739 Peripheral vascular disease, unspecified: Secondary | ICD-10-CM

## 2021-02-14 DIAGNOSIS — M79674 Pain in right toe(s): Secondary | ICD-10-CM | POA: Diagnosis not present

## 2021-02-14 DIAGNOSIS — L608 Other nail disorders: Secondary | ICD-10-CM

## 2021-02-14 DIAGNOSIS — B351 Tinea unguium: Secondary | ICD-10-CM

## 2021-02-14 MED FILL — ERGOCALCIFEROL (VITAMIN D2) 1,250 MCG (50,000 UNIT) CAPSULE: ORAL | 28 days supply | Qty: 4 | Fill #2

## 2021-02-14 MED FILL — ENVARSUS XR 4 MG TABLET,EXTENDED RELEASE: ORAL | 30 days supply | Qty: 60 | Fill #0

## 2021-02-14 MED FILL — SODIUM BICARBONATE 650 MG TABLET: ORAL | 30 days supply | Qty: 60 | Fill #10

## 2021-02-14 MED FILL — PREGABALIN 75 MG CAPSULE: ORAL | 30 days supply | Qty: 90 | Fill #1

## 2021-02-14 MED FILL — MAGNESIUM OXIDE 400 MG (241.3 MG MAGNESIUM) TABLET: ORAL | 60 days supply | Qty: 120 | Fill #3

## 2021-02-14 MED FILL — ENVARSUS XR 1 MG TABLET,EXTENDED RELEASE: 30 days supply | Qty: 30 | Fill #1

## 2021-02-14 NOTE — Progress Notes (Signed)
This patient returns to my office for at risk foot care.  This patient requires this care by a professional since this patient will be at risk due to having kidney transplant, PAD, and diabetes.  This patient is unable to cut nails herself since the patient cannot reach her nails.These nails are painful walking and wearing shoes.  This patient presents for at risk foot care today. ? ?General Appearance  Alert, conversant and in no acute stress. ? ?Vascular  Dorsalis pedis and posterior tibial  pulses are palpable  right foot.  Pulses are absent left foot.  Capillary return is within normal limits  bilaterally. Temperature is within normal limits  bilaterally. ? ?Neurologic  Senn-Weinstein monofilament wire test within normal limits  bilaterally. Muscle power within normal limits bilaterally. ? ?Nails Thick disfigured discolored nails with subungual debris  from hallux to fifth toes bilaterally. No evidence of bacterial infection or drainage bilaterally. ? ?Orthopedic  No limitations of motion  feet .  No crepitus or effusions noted.  No bony pathology or digital deformities noted.  HAV  B/L. ? ?Skin  normotropic skin with no porokeratosis noted bilaterally.  No signs of infections or ulcers noted.    ? ?Onychomycosis  Pain in right toes  Pain in left toes ? ?Consent was obtained for treatment procedures.   Mechanical debridement of nails 1-5  bilaterally performed with a nail nipper.  Filed with dremel without incident.  ? ? ?Return office visit   3 months.                  Told patient to return for periodic foot care and evaluation due to potential at risk complications. ? ? ?Margarit Minshall DPM  ?

## 2021-02-19 DIAGNOSIS — Z940 Kidney transplant status: Secondary | ICD-10-CM | POA: Diagnosis not present

## 2021-02-20 LAB — CBC W/ DIFFERENTIAL
BANDED NEUTROPHILS ABSOLUTE COUNT: 0 10*3/uL (ref 0.0–0.1)
BASOPHILS ABSOLUTE COUNT: 0 10*3/uL (ref 0.0–0.2)
BASOPHILS RELATIVE PERCENT: 1 %
EOSINOPHILS ABSOLUTE COUNT: 0.1 10*3/uL (ref 0.0–0.4)
EOSINOPHILS RELATIVE PERCENT: 3 %
HEMATOCRIT: 30.8 % — ABNORMAL LOW (ref 34.0–46.6)
HEMOGLOBIN: 10.4 g/dL — ABNORMAL LOW (ref 11.1–15.9)
IMMATURE GRANULOCYTES: 0 %
LYMPHOCYTES ABSOLUTE COUNT: 1.4 10*3/uL (ref 0.7–3.1)
LYMPHOCYTES RELATIVE PERCENT: 25 %
MEAN CORPUSCULAR HEMOGLOBIN CONC: 33.8 g/dL (ref 31.5–35.7)
MEAN CORPUSCULAR HEMOGLOBIN: 31.1 pg (ref 26.6–33.0)
MEAN CORPUSCULAR VOLUME: 92 fL (ref 79–97)
MONOCYTES ABSOLUTE COUNT: 0.3 10*3/uL (ref 0.1–0.9)
MONOCYTES RELATIVE PERCENT: 6 %
NEUTROPHILS ABSOLUTE COUNT: 3.7 10*3/uL (ref 1.4–7.0)
NEUTROPHILS RELATIVE PERCENT: 65 %
PLATELET COUNT: 117 10*3/uL — ABNORMAL LOW (ref 150–450)
RED BLOOD CELL COUNT: 3.34 x10E6/uL — ABNORMAL LOW (ref 3.77–5.28)
RED CELL DISTRIBUTION WIDTH: 14.5 % (ref 11.7–15.4)
WHITE BLOOD CELL COUNT: 5.6 10*3/uL (ref 3.4–10.8)

## 2021-02-20 LAB — PHOSPHORUS: PHOSPHORUS, SERUM: 3.7 mg/dL (ref 3.0–4.3)

## 2021-02-20 LAB — BASIC METABOLIC PANEL
BLOOD UREA NITROGEN: 21 mg/dL (ref 8–27)
BUN / CREAT RATIO: 14 (ref 12–28)
CALCIUM: 9.5 mg/dL (ref 8.7–10.3)
CHLORIDE: 104 mmol/L (ref 96–106)
CO2: 19 mmol/L — ABNORMAL LOW (ref 20–29)
CREATININE: 1.48 mg/dL — ABNORMAL HIGH (ref 0.57–1.00)
EGFR: 40 mL/min/{1.73_m2} — ABNORMAL LOW
GLUCOSE: 137 mg/dL — ABNORMAL HIGH (ref 65–99)
POTASSIUM: 5.3 mmol/L — ABNORMAL HIGH (ref 3.5–5.2)
SODIUM: 135 mmol/L (ref 134–144)

## 2021-02-20 LAB — MAGNESIUM: MAGNESIUM: 1.9 mg/dL (ref 1.6–2.3)

## 2021-02-22 DIAGNOSIS — Z135 Encounter for screening for eye and ear disorders: Secondary | ICD-10-CM | POA: Diagnosis not present

## 2021-02-22 LAB — TACROLIMUS LEVEL: TACROLIMUS BLOOD: 5.3 ng/mL (ref 2.0–20.0)

## 2021-02-25 DIAGNOSIS — Z940 Kidney transplant status: Principal | ICD-10-CM

## 2021-03-11 DIAGNOSIS — Z940 Kidney transplant status: Principal | ICD-10-CM

## 2021-03-11 MED ORDER — SODIUM BICARBONATE 650 MG TABLET
ORAL_TABLET | Freq: Two times a day (BID) | ORAL | 11 refills | 30.00000 days
Start: 2021-03-11 — End: 2022-03-11

## 2021-03-11 MED ORDER — ERGOCALCIFEROL (VITAMIN D2) 1,250 MCG (50,000 UNIT) CAPSULE
ORAL_CAPSULE | ORAL | 2 refills | 28 days | Status: CP
Start: 2021-03-11 — End: 2022-03-11
  Filled 2021-03-13: qty 4, 28d supply, fill #0

## 2021-03-11 NOTE — Unmapped (Signed)
The patient is requesting a medication refill

## 2021-03-11 NOTE — Unmapped (Signed)
[facility] Clinical Assessment & Refill Coordination Note    Michele Cisneros, DOB: 1955-12-23  Phone: [phone] (home)     All above HIPAA information was verified with patient.     Was a Nurse, learning disability used for this call? No    Specialty Medication(s):   Transplant: Envarsus 1mg  and Envarsus 4mg      Current Outpatient Medications   Medication Sig Dispense Refill   ??? acetaminophen (TYLENOL) 500 MG tablet Take 1-2 tablets (500-1,000 mg total) by mouth every six (6) hours as needed for pain or fever (> 38C or 100.6F). 30 tablet 0   ??? albuterol (PROVENTIL HFA;VENTOLIN HFA) 90 mcg/actuation inhaler Inhale 2 puffs every six (6) hours as needed for wheezing.     ??? amLODIPine (NORVASC) 5 MG tablet Take 1 tablet (5 mg total) by mouth daily. 30 tablet 11   ??? aspirin (ECOTRIN) 81 MG tablet Take 81 mg by mouth daily.     ??? atorvastatin (LIPITOR) 20 MG tablet Take 1 tablet (20 mg total) by mouth daily. 90 tablet 3   ??? blood sugar diagnostic (CONTOUR TEST STRIPS) Strp by Other route two (2) times a day. TEST BLOOD SUGARS 2 TIMES DAILY 60 strip 5   ??? blood-glucose meter (ONETOUCH ULTRA SYSTEM KIT) kit Please dispense for patient to use at home to check blood sugars.     ??? carvediloL (COREG) 6.25 MG tablet Take 1 tablet (6.25 mg total) by mouth Two (2) times a day. (Patient not taking: Reported on 09/12/2020) 180 tablet 3   ??? cloNIDine HCL (CATAPRES) 0.1 MG tablet Take 2 tablets (0.2 mg total) by mouth Two (2) times a day. 120 tablet 11   ??? ergocalciferol-1,250 mcg, 50,000 unit, (VITAMIN D2-1,250 MCG, 50,000 UNIT,) 1,250 mcg (50,000 unit) capsule Take 1 capsule (1,250 mcg total) by mouth once a week. Take for 8 weeks as directed. 4 capsule 2   ??? esomeprazole (NEXIUM) 20 MG capsule Take 1 capsule (20 mg total) by mouth daily. 90 capsule 3   ??? LANCETS,THIN MISC Using at home to test blood sugars twice daily.     ??? magnesium oxide (MAG-OX) 400 mg (241.3 mg elemental magnesium) tablet Take 1 tablet (400 mg total) by mouth Two (2) times a day. 120 tablet 11   ??? melatonin 3 mg Tab Take 2 tablets (6 mg total) by mouth nightly as needed. (Patient not taking: Reported on 09/12/2020)  0   ??? mycophenolate (MYFORTIC) 180 MG EC tablet HOLD (Patient not taking: Reported on 02/15/2020) 120 tablet 11   ??? nitroglycerin (NITROSTAT) 0.4 MG SL tablet Place 0.4 mg under the tongue daily as needed.     ??? pregabalin (LYRICA) 75 MG capsule Take 1 capsule (75 mg total) by mouth every morning AND 2 capsules (150 mg total) nightly. 90 capsule 3   ??? sodium bicarbonate 650 mg tablet Take 1 tablet (650 mg total) by mouth Two (2) times a day. 60 tablet 11   ??? tacrolimus (ENVARSUS XR) 1 mg Tb24 extended release tablet Take 1 (1mg ) tablet with 2 (4mg ) tablets every morning to equal 9mg  daily dose 30 tablet 11   ??? tacrolimus (ENVARSUS XR) 4 mg Tb24 extended release tablet Take 2 (4mg ) tablets with 1 (1mg ) tablet every morning to equal 9mg  daily dose 60 tablet 11   ??? traMADoL (ULTRAM) 50 mg tablet Take 1 tablet (50 mg total) by mouth every eight (8) hours as needed for pain. (Patient not taking: Reported on  09/12/2020) 30 tablet 0     No current facility-administered medications for this visit.        Changes to medications: Michele Cisneros reports no changes at this time.    Allergies   Allergen Reactions   ??? Hydrocodone Hives   ??? Lisinopril      Unknown reaction    ??? Perfume        Changes to allergies: No    SPECIALTY MEDICATION ADHERENCE     envarsus 1mg   : 7 days of medicine on hand   envarsus 4mg   : 7 days of medicine on hand     Medication Adherence    Patient reported X missed doses in the last month: 0  Specialty Medication: envarsus 1mg   Patient is on additional specialty medications: Yes  Additional Specialty Medications: Envarsus 4mg   Patient Reported Additional Medication X Missed Doses in the Last Month: 0          Specialty medication(s) dose(s) confirmed: Regimen is correct and unchanged.     Are there any concerns with adherence? No    Adherence counseling provided? Not needed    CLINICAL MANAGEMENT AND INTERVENTION      Clinical Benefit Assessment:    Do you feel the medicine is effective or helping your condition? Yes    Clinical Benefit counseling provided? Not needed    Adverse Effects Assessment:    Are you experiencing any side effects? patient states constipation over the last month. hasn't started or changed any meds, bloodwork has been fine. she is speaking with clinic today about this, i will message them as well    Are you experiencing difficulty administering your medicine? No    Quality of Life Assessment:         How many days over the past month did your transplant  keep you from your normal activities? For example, brushing your teeth or getting up in the morning. 0    Have you discussed this with your provider? Not needed    Acute Infection Status:    Acute infections noted within Epic:  No active infections  Patient reported infection: None    Therapy Appropriateness:    Is therapy appropriate and patient progressing towards therapeutic goals? Yes, therapy is appropriate and should be continued    DISEASE/MEDICATION-SPECIFIC INFORMATION      N/A    PATIENT SPECIFIC NEEDS     - Does the patient have any physical, cognitive, or cultural barriers? No    - Is the patient high risk? No    - Does the patient require a Care Management Plan? No     - Does the patient require physician intervention or other additional services (i.e. nutrition, smoking cessation, social work)? No      SHIPPING     Specialty Medication(s) to be Shipped:   Transplant: Envarsus 1mg  and envarsus 4mg     Other medication(s) to be shipped: vitamin d, sodium bicarb, lyrica     Changes to insurance: No    Delivery Scheduled: Yes, Expected medication delivery date: 03/14/2021.  However, Rx request for refills was sent to the provider as there are none remaining.     Medication will be delivered via Next Day Courier to the confirmed prescription address in [facility].    The patient will receive a drug information handout for each medication shipped and additional FDA Medication Guides as required.  Verified that patient has previously received a Conservation officer, historic buildings and a Surveyor, mining.  The patient or caregiver noted above participated in the development of this care plan and knows that they can request review of or adjustments to the care plan at any time.      All of the patient's questions and concerns have been addressed.    Thad Ranger   [facility] Specialty Pharmacist

## 2021-03-12 DIAGNOSIS — N186 End stage renal disease: Principal | ICD-10-CM

## 2021-03-12 DIAGNOSIS — Z940 Kidney transplant status: Principal | ICD-10-CM

## 2021-03-12 MED ORDER — DOCUSATE SODIUM 100 MG CAPSULE
ORAL_CAPSULE | Freq: Two times a day (BID) | ORAL | 1 refills | 30 days | Status: CP | PRN
Start: 2021-03-12 — End: 2022-03-12
  Filled 2021-03-13: qty 60, 30d supply, fill #0

## 2021-03-12 NOTE — Unmapped (Signed)
Patient called to go over when her next apt is.    Reports she has no energy. Will order iron panel/vit D    Eating and drinking good.  Reports she has had some constipation. Will send in new prescription for colace.

## 2021-03-13 LAB — CBC W/ DIFFERENTIAL
BANDED NEUTROPHILS ABSOLUTE COUNT: 0 10*3/uL (ref 0.0–0.1)
BASOPHILS ABSOLUTE COUNT: 0 10*3/uL (ref 0.0–0.2)
BASOPHILS RELATIVE PERCENT: 1 %
EOSINOPHILS ABSOLUTE COUNT: 0.2 10*3/uL (ref 0.0–0.4)
EOSINOPHILS RELATIVE PERCENT: 3 %
HEMATOCRIT: 35.7 % (ref 34.0–46.6)
HEMOGLOBIN: 12.1 g/dL (ref 11.1–15.9)
IMMATURE GRANULOCYTES: 0 %
LYMPHOCYTES ABSOLUTE COUNT: 1.4 10*3/uL (ref 0.7–3.1)
LYMPHOCYTES RELATIVE PERCENT: 21 %
MEAN CORPUSCULAR HEMOGLOBIN CONC: 33.9 g/dL (ref 31.5–35.7)
MEAN CORPUSCULAR HEMOGLOBIN: 30.9 pg (ref 26.6–33.0)
MEAN CORPUSCULAR VOLUME: 91 fL (ref 79–97)
MONOCYTES ABSOLUTE COUNT: 0.4 10*3/uL (ref 0.1–0.9)
MONOCYTES RELATIVE PERCENT: 6 %
NEUTROPHILS ABSOLUTE COUNT: 4.5 10*3/uL (ref 1.4–7.0)
NEUTROPHILS RELATIVE PERCENT: 69 %
PLATELET COUNT: 149 10*3/uL — ABNORMAL LOW (ref 150–450)
RED BLOOD CELL COUNT: 3.92 x10E6/uL (ref 3.77–5.28)
RED CELL DISTRIBUTION WIDTH: 14.5 % (ref 11.7–15.4)
WHITE BLOOD CELL COUNT: 6.4 10*3/uL (ref 3.4–10.8)

## 2021-03-13 LAB — BASIC METABOLIC PANEL
BLOOD UREA NITROGEN: 18 mg/dL (ref 8–27)
BUN / CREAT RATIO: 12 (ref 12–28)
CALCIUM: 10.2 mg/dL (ref 8.7–10.3)
CHLORIDE: 103 mmol/L (ref 96–106)
CO2: 20 mmol/L (ref 20–29)
CREATININE: 1.47 mg/dL — ABNORMAL HIGH (ref 0.57–1.00)
EGFR: 40 mL/min/{1.73_m2} — ABNORMAL LOW
GLUCOSE: 200 mg/dL — ABNORMAL HIGH (ref 70–99)
POTASSIUM: 5.1 mmol/L (ref 3.5–5.2)
SODIUM: 138 mmol/L (ref 134–144)

## 2021-03-13 LAB — MAGNESIUM: MAGNESIUM: 2.5 mg/dL — ABNORMAL HIGH (ref 1.6–2.3)

## 2021-03-13 LAB — PHOSPHORUS: PHOSPHORUS, SERUM: 4.2 mg/dL (ref 3.0–4.3)

## 2021-03-13 MED ORDER — SODIUM BICARBONATE 650 MG TABLET
ORAL_TABLET | Freq: Two times a day (BID) | ORAL | 11 refills | 30 days | Status: CP
Start: 2021-03-13 — End: 2022-03-13
  Filled 2021-03-13: qty 60, 30d supply, fill #0

## 2021-03-13 MED FILL — PREGABALIN 75 MG CAPSULE: ORAL | 30 days supply | Qty: 90 | Fill #2

## 2021-03-13 MED FILL — ENVARSUS XR 1 MG TABLET,EXTENDED RELEASE: 30 days supply | Qty: 30 | Fill #2

## 2021-03-13 NOTE — Unmapped (Signed)
Michele Cisneros 's Envarsus XR 4mg  shipment will be canceled  as a result of MAPs secured manufacture assistance.     I have reached out to the patient  at [phone] and communicated the delivery change. We will not reschedule the medication and have removed this/these medication(s) from the work request.  We have not confirmed the new delivery date.      Patient was not aware that she had been approved for [facility] assistance. I gave her the phone # to call and get started. She said she has about 6 days of the 4mg  remaining, but running out of the 1mg . Sending everything except for 4mg . She will find out how long before she will receive first shipment from [facility] and will call us back if it's greater than 6 days to get a shorter fill of the Envarsus XR 4mg .

## 2021-03-15 ENCOUNTER — Telehealth: Payer: Self-pay | Admitting: Internal Medicine

## 2021-03-15 LAB — TACROLIMUS LEVEL: TACROLIMUS BLOOD: 6 ng/mL (ref 2.0–20.0)

## 2021-03-15 NOTE — Telephone Encounter (Signed)
Left pt vm to call [place] to sch mammo. thanks

## 2021-03-26 ENCOUNTER — Other Ambulatory Visit: Payer: Self-pay | Admitting: Internal Medicine

## 2021-03-26 ENCOUNTER — Telehealth: Payer: Self-pay | Admitting: Internal Medicine

## 2021-03-26 DIAGNOSIS — R921 Mammographic calcification found on diagnostic imaging of breast: Secondary | ICD-10-CM

## 2021-03-26 DIAGNOSIS — Z940 Kidney transplant status: Secondary | ICD-10-CM | POA: Diagnosis not present

## 2021-03-26 DIAGNOSIS — Z1231 Encounter for screening mammogram for malignant neoplasm of breast: Secondary | ICD-10-CM

## 2021-03-26 DIAGNOSIS — N186 End stage renal disease: Secondary | ICD-10-CM | POA: Diagnosis not present

## 2021-03-26 LAB — IRON & TIBC
IRON SATURATION: 48 % (ref 15–55)
IRON: 92 ug/dL (ref 27–139)
TOTAL IRON BINDING CAPACITY: 193 ug/dL — ABNORMAL LOW (ref 250–450)
UNSATURATED IRON BINDING CAPACITY: 101 ug/dL — ABNORMAL LOW (ref 118–369)

## 2021-03-26 LAB — FERRITIN: FERRITIN: 787 ng/mL — ABNORMAL HIGH (ref 15–150)

## 2021-03-26 LAB — VITAMIN D 25 HYDROXY: VITAMIN D 25-HYDROXY: 48.3 ng/mL (ref 30.0–100.0)

## 2021-03-26 NOTE — Telephone Encounter (Signed)
Patient was informed to schedule her Mammogram in 81mths from July. Roselyn Reef at [facility] informed her of this.

## 2021-03-27 LAB — VITAMIN D 1,25 DIHYDROXY: VITAMIN D 1,25-DIHYDROXY: 47.3 pg/mL (ref 24.8–81.5)

## 2021-04-01 NOTE — Unmapped (Signed)
Update TRF

## 2021-04-05 ENCOUNTER — Telehealth (INDEPENDENT_AMBULATORY_CARE_PROVIDER_SITE_OTHER): Payer: Medicare Other | Admitting: Internal Medicine

## 2021-04-05 ENCOUNTER — Encounter: Payer: Self-pay | Admitting: Internal Medicine

## 2021-04-05 ENCOUNTER — Other Ambulatory Visit: Payer: Self-pay

## 2021-04-05 VITALS — BP 135/60 | Ht 67.5 in | Wt 153.0 lb

## 2021-04-05 DIAGNOSIS — R928 Other abnormal and inconclusive findings on diagnostic imaging of breast: Secondary | ICD-10-CM

## 2021-04-05 DIAGNOSIS — J309 Allergic rhinitis, unspecified: Secondary | ICD-10-CM | POA: Diagnosis not present

## 2021-04-05 DIAGNOSIS — J321 Chronic frontal sinusitis: Secondary | ICD-10-CM

## 2021-04-05 DIAGNOSIS — Z23 Encounter for immunization: Secondary | ICD-10-CM

## 2021-04-05 DIAGNOSIS — R921 Mammographic calcification found on diagnostic imaging of breast: Secondary | ICD-10-CM

## 2021-04-05 DIAGNOSIS — J0110 Acute frontal sinusitis, unspecified: Secondary | ICD-10-CM

## 2021-04-05 MED ORDER — ATORVASTATIN 20 MG TABLET
ORAL_TABLET | Freq: Every day | ORAL | 3 refills | 90.00000 days | Status: CP
Start: 2021-04-05 — End: 2022-04-05
  Filled 2021-04-09: qty 90, 90d supply, fill #0

## 2021-04-05 MED ORDER — ESOMEPRAZOLE MAGNESIUM 20 MG CAPSULE,DELAYED RELEASE
ORAL_CAPSULE | Freq: Every day | ORAL | 3 refills | 90 days | Status: CP
Start: 2021-04-05 — End: 2022-04-05
  Filled 2021-04-09: qty 90, 90d supply, fill #0

## 2021-04-05 NOTE — Progress Notes (Signed)
Telephone  Note  I connected with Marcial Pacas on 04/05/21 at 11:30 AM EDT by telephone and verified that I am speaking with the correct person using two identifiers. Location patient: home, [place] Location provider:work or home office Persons participating in the virtual visit: patient, provider  I discussed the limitations of evaluation and management by telemedicine and the availability of in person appointments. The patient expressed understanding and agreed to proceed.   HPI: Acute telemedicine visit for : -allergies/stuffy nose just moved and has carpet which landlord stated was cleaned but moved 01/2021 and since 01/2021 had stuffy nose head congestion no exposure to covid or taken covid test. No fever/sinus pain/pressure  -COVID-19 vaccine status: 4 shots   ROS: See pertinent positives and negatives per HPI.  Past Medical History:  Diagnosis Date   Anemia of chronic disease    Anginal pain ([place])    Carotid arterial disease ([place])    a. 02/2013 U/S: 40-59% bilat ICA stenosis.   Chronic constipation    Chronic diastolic CHF (congestive heart failure) ([place])    a. 10/2013 Echo [facility]): EF 55-60%, mod conc LVH, mod MR, mildly dil LA, mild Ao sclerosis w/o stenosis; b. 02/2016 Echo: EF 55-60%, no rwma, Gr DD, mild AS, mod to sev MR, mildly dil LA, PASP 23mmHg; c. 07/2017 Echo [facility]): EF >55%, mild to mod LVH, Gr2 DD, Ao scl, Mod MR, mod dil LA, nl RV fxn.   Colon polyps    COPD (chronic obstructive pulmonary disease) (HCC)    Coronary artery disease    a. 05/2013 NSTEMI/PCI: LM 20d, LAD min irregs, LCX small, nl, OM1 nl, RCA dom 18m (2.5x16 Promus DES), PDA1 80p; b. 04/2015 Cath: LM nl, LAD 27m, D1/2 min irregs, LCX 35p/m, OM2/3 min irregs, RCA patent mid stent, RPDA 70ost, RPLB1 30, RPLB2/3 min irregs, EF 55-65%--> Med Rx; c. 06/2017 [facility]): No ischemia/infarct, EF 64%.   Diabetes mellitus    Diabetic neuropathy ([place])    Diabetic retinopathy ([place]) 05/28/2013   Hx bilat retinal detachment,  proliferative diab retinopathy and bilat vitreous hemorrhage    Emphysema    ESRD on hemodialysis ([place])    a. [facility] in [place], [place] [phone]/Dr. [name], on a MWF schedule.  She started dialysis in Feb 2014.  Etiology of renal failure not known, likely diabetes.  Has a left upper arm AV graft.   History of bronchitis    Mar 2012   History of pneumonia    June 2012   History of tobacco abuse    a. Quit 2012.   Hyperlipidemia    Hypertension    Moderate to severe mitral insufficiency    a. 10/2013 Echo: EF 55-60%, mod MR; b. 02/2016 Echo: EF 55-60%, mod to sev MR directed posteriorly--felt to be dynamic -worse with volume overload; c. 07/2017 Echo [facility]): EF >55%. Mod MR.   Myocardial infarct [facility]) 05/2013   Peripheral vascular disease ([place])    Sickle cell trait ([place])    Thyroid nodule    [place] 06/2017 due to f/u US in 1 year     Past Surgical History:  Procedure Laterality Date   A/V FISTULAGRAM Left 11/10/2017   Procedure: A/V FISTULAGRAM;  Surgeon: Katha Cabal, MD;  Location: [place] CV [facility];  Service: Cardiovascular;  Laterality: Left;   A/V SHUNTOGRAM Left 02/08/2019   Procedure: A/V SHUNTOGRAM;  Surgeon: Katha Cabal, MD;  Location: [place] [facility];  Service: Cardiovascular;  Laterality: Left;   ABDOMINAL HYSTERECTOMY  2000 for cysts on ovaries total no abnormal pap   CARDIAC CATHETERIZATION     CARDIAC CATHETERIZATION N/A 05/02/2015   Procedure: Left Heart Cath and Coronary Angiography;  Surgeon: [name], MD;  Location: [place] [facility];  Service: Cardiovascular;  Laterality: N/A;   COLONOSCOPY WITH PROPOFOL N/A 07/08/2016   Procedure: COLONOSCOPY WITH PROPOFOL;  Surgeon: [name], MD;  Location: [facility] ENDOSCOPY;  Service: Endoscopy;  Laterality: N/A;   COLONOSCOPY WITH PROPOFOL N/A 08/20/2017   Procedure: COLONOSCOPY WITH PROPOFOL;  Surgeon: [name], MD;  Location: [facility] ENDOSCOPY;  Service: Gastroenterology;  Laterality: N/A;    colonscopy     CORONARY ANGIOPLASTY  05/28/2014   stent placement to the mid RCA   CYSTOURETHROSCOPY     [facility] urology with stent placement 11 or 05/2019 and stent removal 06/13/19    DILATION AND CURETTAGE OF UTERUS     several in the early 80's   ESOPHAGOGASTRODUODENOSCOPY     2012   ESOPHAGOGASTRODUODENOSCOPY (EGD) WITH PROPOFOL N/A 03/11/2018   Procedure: ESOPHAGOGASTRODUODENOSCOPY (EGD) WITH PROPOFOL;  Surgeon: [name], MD;  Location: [facility] ENDOSCOPY;  Service: Gastroenterology;  Laterality: N/A;   EYE SURGERY     bilateral laser 2012   EYE SURGERY     right   EYE SURGERY     x4 both eyes   GAS INSERTION  09/30/2011   Procedure: INSERTION OF GAS;  Surgeon: [name], MD;  Location: [place];  Service: Ophthalmology;  Laterality: Right;  [id number]   GAS/FLUID EXCHANGE  09/30/2011   Procedure: GAS/FLUID EXCHANGE;  Surgeon: [name], MD;  Location: [place];  Service: Ophthalmology;  Laterality: Right;   KIDNEY TRANSPLANT     03/30/19 Dr. Mickel Baas Thomas/Dr. Cristie Hem Zendel/Dr. Gilford Raid; [facility] transplant Dr. Horald Chestnut will be primary [phone] coordinator Sarah Wynkoop [phone]   KIDNEY TRANSPLANT     03/30/19 [facility]   LEFT HEART CATHETERIZATION WITH CORONARY ANGIOGRAM N/A 05/28/2013   Procedure: LEFT HEART CATHETERIZATION WITH CORONARY ANGIOGRAM;  Surgeon: [name], MD;  Location: [facility] CATH LAB;  Service: Cardiovascular;  Laterality: N/A;   PARS PLANA VITRECTOMY  04/22/2011   Procedure: PARS PLANA VITRECTOMY WITH 25 GAUGE;  Surgeon: [name], MD;  Location: [place];  Service: Ophthalmology;  Laterality: Left;  membrane peel, endolaser, gas fluid exchange, silicone oil, repair of complex traction retinal detachment   PARS PLANA VITRECTOMY  09/30/2011   Procedure: PARS PLANA VITRECTOMY WITH 25 GAUGE;  Surgeon: [name], MD;  Location: [place];  Service: Ophthalmology;  Laterality: Right;  Endolaser; Repair of Complex Traction Retinal Detachment   PARS PLANA  VITRECTOMY  02/24/2012   Procedure: PARS PLANA VITRECTOMY WITH 25 GAUGE;  Surgeon: [name], MD;  Location: [place];  Service: Ophthalmology;  Laterality: Left;   PTCA     SILICON OIL REMOVAL  56/43/3295   Procedure: SILICON OIL REMOVAL;  Surgeon: [name], MD;  Location: [place];  Service: Ophthalmology;  Laterality: Left;   THROMBECTOMY / ARTERIOVENOUS GRAFT REVISION     TUBAL LIGATION     1979   UPPER EXTREMITY ANGIOGRAPHY Left 02/08/2019   Procedure: UPPER EXTREMITY ANGIOGRAPHY;  Surgeon: Katha Cabal, MD;  Location: [place] [facility];  Service: Cardiovascular;  Laterality: Left;     Current Outpatient Medications:    acetaminophen (TYLENOL) 500 MG tablet, Take 1,000 mg by mouth daily as needed., Disp: , Rfl:    amLODipine (NORVASC) 10  MG tablet, Take 1 tablet (10 mg total) by mouth daily., Disp: 90 tablet, Rfl: 3   atorvastatin (LIPITOR) 20 MG tablet, Take 1 tablet (20 mg total) by mouth daily., Disp: 90 tablet, Rfl: 3   cloNIDine (CATAPRES) 0.2 MG tablet, Take 1 tablet (0.2 mg total) by mouth 2 (two) times daily., Disp: 180 tablet, Rfl: 1   ENVARSUS XR 1 MG TB24, Take 1 tablet by mouth daily as needed., Disp: , Rfl:    esomeprazole (NEXIUM) 20 MG capsule, Take 20 mg by mouth daily., Disp: , Rfl:    loratadine (CLARITIN) 10 MG tablet, Take 10 mg by mouth daily., Disp: , Rfl:    magnesium oxide (MAG-OX) 400 MG tablet, Take 400 mg by mouth 2 (two) times daily., Disp: , Rfl:    pregabalin (LYRICA) 75 MG capsule, Take 1 capsule (75 mg total) by mouth 2 (two) times daily., Disp: 60 capsule, Rfl: 5   sodium bicarbonate 650 MG tablet, Take 650 mg by mouth 2 (two) times daily., Disp: , Rfl:    Tacrolimus ER 4 MG TB24, Take 8 mg by mouth every morning., Disp: , Rfl:    Vitamin D, Ergocalciferol, (DRISDOL) 1.25 MG (50000 UNIT) CAPS capsule, Take 50,000 Units by mouth every 7 (seven) days. Mondays, Disp: , Rfl:    albuterol (PROVENTIL) (2.5 MG/3ML) 0.083% nebulizer solution,  Take 3 mLs (2.5 mg total) by nebulization every 6 (six) hours as needed for wheezing or shortness of breath. (Patient not taking: Reported on 04/05/2021), Disp: 75 mL, Rfl: 11   albuterol (VENTOLIN HFA) 108 (90 Base) MCG/ACT inhaler, Inhale 1-2 puffs into the lungs every 6 (six) hours as needed for wheezing or shortness of breath. (Patient not taking: Reported on 04/05/2021), Disp: 1 each, Rfl: 12   carvedilol (COREG) 12.5 MG tablet, Take 1 tablet (12.5 mg total) by mouth 2 (two) times daily. (Patient not taking: Reported on 04/05/2021), Disp: 90 tablet, Rfl: 3   furosemide (LASIX) 20 MG tablet, Take 1 tablet (20 mg total) by mouth daily as needed (shortness of breath). (Patient not taking: Reported on 04/05/2021), Disp: 30 tablet, Rfl: 0   hydrALAZINE (APRESOLINE) 50 MG tablet, Take 1 tablet (50 mg total) by mouth 3 (three) times daily as needed. For BP greater then 140 (Patient not taking: Reported on 04/05/2021), Disp: 270 tablet, Rfl: 3   nitroGLYCERIN (NITROSTAT) 0.4 MG SL tablet, Place 1 tablet (0.4 mg total) under the tongue every 5 (five) minutes as needed for chest pain. (Patient not taking: Reported on 04/05/2021), Disp: 25 tablet, Rfl: 3   oxyCODONE-acetaminophen (PERCOCET/ROXICET) 5-325 MG tablet, Take 1 tablet by mouth 2 (two) times daily as needed for severe pain. (Patient not taking: No sig reported), Disp: 60 tablet, Rfl: 0  Current Facility-Administered Medications:    hydrALAZINE (APRESOLINE) tablet 25 mg, 25 mg, Oral, Once, Visser, Jacquelyn D, PA-C  EXAM:  VITALS per patient if applicable:  GENERAL: alert, oriented, appears well and in no acute distress  PSYCH/NEURO: pleasant and cooperative, no obvious depression or anxiety, speech and thought processing grossly intact  ASSESSMENT AND PLAN:  Discussed the following assessment and plan:  Sinusitis/allergic rhinitis   Declines nose sprays  Will continue to use otc claritin and has humidifier  Write letter of necessity  landlord needs to change carpet in home renting likely a trigger   Abnormal mammogram 12/2020 b/l diag mammogram in 6 months   HM Flu shot due pna 23 utd  prevnar utd covid pfizer  4/4 consider  5th s/p renal transplant    Tetanus UTD 06/09/10 consider in future update this sent rx pharmacy 04/08/21  Hep B immune had engerix as well 4 doses 2014-2015  Consider shingrix in future disc prev pt unsure if insurance will cover it  HCV neg 04/25/19 Hep B sAb 98.50 03/28/19 HIV neg 03/28/19   Labs reviewed this visit 11/20/20  [facility] labs and has labs upcoming [facility]   Pap neg 11/13/16 neg HPV s/p hysterectomy  -will ask in future if h/o abnormal pap if so will do pap if not d/c paps    mammo 12/2020 breast calcifications needs repeat dx b/l in 6 months   h/o multiple polyps colonoscopy 08/20/17 repeat in 3 years [facility] or [facility] can do this  -EGD 03/11/18 neg gastritis  -EGD ulcers 04/2019 neg HSV/H pylori [facility] egd  08/25/17 several polyps due 08/25/20 tubular adenoma  Referred Dr. [name] as of 04/05/21 sent GI message about rescheduling   Former smoker x 25-30 years <1 ppd  CTA chest 01/12/18 moderate b/l pleural effusions and CAD and aortic atherosclerosis, no nodules   ct chest had 09/12/20 [facility]    Thyroid nodules/parathyroid mass   [place] 06/16/17 right upper thyroid nodule ? Parathyroid mass 1.1 cm and left mid nodule f/u annually last 02/01/20    Thyroid US 02/01/20 [facility]  Additional similar solid cystic or cystic nodules identified, without suspicious features.  Incidentally noted nonocclusive atherosclerotic plaque noted in the right common carotid artery.  IMPRESSION:  No suspicious thyroid nodules identified.      GI appt [facility] 06/20/19 f/u esophageal ulcers Dr. [name] Cards Dr. Rockey Situ Renal [facility] renal and transplant s/p kidney transplant 03/28/20 [place] [facility] exam sch 07/2021   -we discussed possible serious and likely etiologies, options for evaluation and workup,  limitations of telemedicine visit vs in person visit, treatment, treatment risks and precautions. Pt is agreeable to treatment via telemedicine at this moment.   I discussed the assessment and treatment plan with the patient. The patient was provided an opportunity to ask questions and all were answered. The patient agreed with the plan and demonstrated an understanding of the instructions.    Time spent 20 min Delorise Jackson, MD

## 2021-04-05 NOTE — Unmapped (Signed)
Specialty Medication(s): Envarsus    Michele Cisneros has been dis-enrolled from the [facility] specialty pharmacy services due to enrollment in a manufacturer assistance program that sends medicine directly to the patient.    Additional information provided to the patient: n/a    Tera Helper  [facility] Specialty Pharmacist

## 2021-04-08 ENCOUNTER — Telehealth: Payer: Self-pay

## 2021-04-08 DIAGNOSIS — R921 Mammographic calcification found on diagnostic imaging of breast: Secondary | ICD-10-CM | POA: Insufficient documentation

## 2021-04-08 HISTORY — DX: Mammographic calcification found on diagnostic imaging of breast: R92.1

## 2021-04-08 MED ORDER — TETANUS-DIPHTH-ACELL PERTUSSIS 5-2.5-18.5 LF-MCG/0.5 IM SUSP
0.5000 mL | Freq: Once | INTRAMUSCULAR | 0 refills | Status: AC
Start: 2021-04-08 — End: 2021-04-08

## 2021-04-08 NOTE — Telephone Encounter (Signed)
Called patient and had to leave her a voicemail letting her know that I was calling her to schedule her a screening colonoscopy.

## 2021-04-08 NOTE — Telephone Encounter (Signed)
-----   Message from [name], MD sent at 04/08/2021  2:58 PM EDT ----- Herb Grays can we get colonoscopy scheduled please  Kiran ----- Message ----- From: McLean-Scocuzza, Nino Glow, MD Sent: 04/05/2021  11:56 AM EDT To: [name], MD  Colonoscopy was due 08/2020 but pt not heard about rescheduling colonscopy

## 2021-04-09 ENCOUNTER — Telehealth: Payer: Self-pay

## 2021-04-09 DIAGNOSIS — Z940 Kidney transplant status: Secondary | ICD-10-CM | POA: Diagnosis not present

## 2021-04-09 LAB — CBC W/ DIFFERENTIAL
BANDED NEUTROPHILS ABSOLUTE COUNT: 0 10*3/uL (ref 0.0–0.1)
BASOPHILS ABSOLUTE COUNT: 0 10*3/uL (ref 0.0–0.2)
BASOPHILS RELATIVE PERCENT: 1 %
EOSINOPHILS ABSOLUTE COUNT: 0.2 10*3/uL (ref 0.0–0.4)
EOSINOPHILS RELATIVE PERCENT: 3 %
HEMATOCRIT: 36 % (ref 34.0–46.6)
HEMOGLOBIN: 11.9 g/dL (ref 11.1–15.9)
IMMATURE GRANULOCYTES: 0 %
LYMPHOCYTES ABSOLUTE COUNT: 1.6 10*3/uL (ref 0.7–3.1)
LYMPHOCYTES RELATIVE PERCENT: 27 %
MEAN CORPUSCULAR HEMOGLOBIN CONC: 33.1 g/dL (ref 31.5–35.7)
MEAN CORPUSCULAR HEMOGLOBIN: 31 pg (ref 26.6–33.0)
MEAN CORPUSCULAR VOLUME: 94 fL (ref 79–97)
MONOCYTES ABSOLUTE COUNT: 0.3 10*3/uL (ref 0.1–0.9)
MONOCYTES RELATIVE PERCENT: 5 %
NEUTROPHILS ABSOLUTE COUNT: 3.9 10*3/uL (ref 1.4–7.0)
NEUTROPHILS RELATIVE PERCENT: 64 %
PLATELET COUNT: 133 10*3/uL — ABNORMAL LOW (ref 150–450)
RED BLOOD CELL COUNT: 3.84 x10E6/uL (ref 3.77–5.28)
RED CELL DISTRIBUTION WIDTH: 15.2 % (ref 11.7–15.4)
WHITE BLOOD CELL COUNT: 6.1 10*3/uL (ref 3.4–10.8)

## 2021-04-09 MED FILL — PREGABALIN 75 MG CAPSULE: ORAL | 30 days supply | Qty: 90 | Fill #3

## 2021-04-09 MED FILL — SODIUM BICARBONATE 650 MG TABLET: ORAL | 30 days supply | Qty: 60 | Fill #1

## 2021-04-09 MED FILL — ERGOCALCIFEROL (VITAMIN D2) 1,250 MCG (50,000 UNIT) CAPSULE: ORAL | 28 days supply | Qty: 4 | Fill #1

## 2021-04-09 MED FILL — DOCUSATE SODIUM 100 MG CAPSULE: ORAL | 30 days supply | Qty: 60 | Fill #1

## 2021-04-09 MED FILL — MAGNESIUM OXIDE 400 MG (241.3 MG MAGNESIUM) TABLET: ORAL | 60 days supply | Qty: 120 | Fill #4

## 2021-04-09 NOTE — Telephone Encounter (Signed)
Called patient back and scheduled her an appointment to see Dr. Vicente Males on 04/23/2021 at 2:30 PM. A letter with the information will be mailed.

## 2021-04-09 NOTE — Telephone Encounter (Signed)
Patient called back and she is scheduled to be seen on 04/23/2021 at 2:30 PM.

## 2021-04-09 NOTE — Telephone Encounter (Signed)
Called patient again and she did not answer. I left her a voicemail to call us back so we could schedule her colonoscopy. Patient had been called several times and she does not answer our calls or voicemail since June 2022 when we received her referral.

## 2021-04-09 NOTE — Telephone Encounter (Signed)
Pt. Returning call. She says she wanted to make an appt for a colonoscopy but wa sunsure.

## 2021-04-10 ENCOUNTER — Ambulatory Visit: Payer: Medicare Other

## 2021-04-10 LAB — MAGNESIUM: MAGNESIUM: 2.4 mg/dL — ABNORMAL HIGH (ref 1.6–2.3)

## 2021-04-10 LAB — BASIC METABOLIC PANEL
BLOOD UREA NITROGEN: 20 mg/dL (ref 8–27)
BUN / CREAT RATIO: 13 (ref 12–28)
CALCIUM: 9.9 mg/dL (ref 8.7–10.3)
CHLORIDE: 100 mmol/L (ref 96–106)
CO2: 22 mmol/L (ref 20–29)
CREATININE: 1.55 mg/dL — ABNORMAL HIGH (ref 0.57–1.00)
EGFR: 37 mL/min/{1.73_m2} — ABNORMAL LOW
GLUCOSE: 249 mg/dL — ABNORMAL HIGH (ref 70–99)
POTASSIUM: 4.7 mmol/L (ref 3.5–5.2)
SODIUM: 134 mmol/L (ref 134–144)

## 2021-04-10 LAB — PHOSPHORUS: PHOSPHORUS, SERUM: 3.9 mg/dL (ref 3.0–4.3)

## 2021-04-10 NOTE — Telephone Encounter (Signed)
Noted  

## 2021-04-11 ENCOUNTER — Other Ambulatory Visit: Payer: Self-pay

## 2021-04-11 ENCOUNTER — Ambulatory Visit (INDEPENDENT_AMBULATORY_CARE_PROVIDER_SITE_OTHER): Payer: Medicare Other

## 2021-04-11 DIAGNOSIS — Z23 Encounter for immunization: Secondary | ICD-10-CM

## 2021-04-11 LAB — TACROLIMUS LEVEL: TACROLIMUS BLOOD: 5.2 ng/mL (ref 2.0–20.0)

## 2021-04-15 NOTE — Progress Notes (Signed)
Patient scheduled. States the letter did not come in the mail. Re-printed and placed on desk to be signed and re-mailed.

## 2021-04-23 ENCOUNTER — Ambulatory Visit: Payer: Medicare Other | Admitting: Gastroenterology

## 2021-04-26 ENCOUNTER — Ambulatory Visit: Admit: 2021-04-26 | Discharge: 2021-04-26 | Payer: MEDICARE

## 2021-04-26 ENCOUNTER — Ambulatory Visit: Admit: 2021-04-26 | Discharge: 2021-04-26 | Payer: MEDICARE | Attending: Nephrology | Primary: Nephrology

## 2021-04-26 DIAGNOSIS — Z940 Kidney transplant status: Principal | ICD-10-CM

## 2021-04-26 DIAGNOSIS — R7989 Other specified abnormal findings of blood chemistry: Principal | ICD-10-CM

## 2021-04-26 DIAGNOSIS — Z9989 Dependence on other enabling machines and devices: Secondary | ICD-10-CM | POA: Diagnosis not present

## 2021-04-26 DIAGNOSIS — Z79899 Other long term (current) drug therapy: Secondary | ICD-10-CM | POA: Diagnosis not present

## 2021-04-26 DIAGNOSIS — Z4822 Encounter for aftercare following kidney transplant: Secondary | ICD-10-CM | POA: Diagnosis not present

## 2021-04-26 DIAGNOSIS — I6523 Occlusion and stenosis of bilateral carotid arteries: Secondary | ICD-10-CM | POA: Diagnosis not present

## 2021-04-26 DIAGNOSIS — E782 Mixed hyperlipidemia: Secondary | ICD-10-CM | POA: Diagnosis not present

## 2021-04-26 DIAGNOSIS — K2210 Ulcer of esophagus without bleeding: Secondary | ICD-10-CM | POA: Diagnosis not present

## 2021-04-26 DIAGNOSIS — I38 Endocarditis, valve unspecified: Secondary | ICD-10-CM | POA: Diagnosis not present

## 2021-04-26 DIAGNOSIS — G4733 Obstructive sleep apnea (adult) (pediatric): Secondary | ICD-10-CM | POA: Diagnosis not present

## 2021-04-26 DIAGNOSIS — I10 Essential (primary) hypertension: Secondary | ICD-10-CM | POA: Diagnosis not present

## 2021-04-26 DIAGNOSIS — I739 Peripheral vascular disease, unspecified: Secondary | ICD-10-CM | POA: Diagnosis not present

## 2021-04-26 DIAGNOSIS — Z6824 Body mass index (BMI) 24.0-24.9, adult: Secondary | ICD-10-CM | POA: Diagnosis not present

## 2021-04-26 DIAGNOSIS — I25118 Atherosclerotic heart disease of native coronary artery with other forms of angina pectoris: Secondary | ICD-10-CM | POA: Diagnosis not present

## 2021-04-26 DIAGNOSIS — D849 Immunodeficiency, unspecified: Secondary | ICD-10-CM | POA: Diagnosis not present

## 2021-04-26 LAB — URINALYSIS
BILIRUBIN UA: NEGATIVE
GLUCOSE UA: 500 — AB
KETONES UA: NEGATIVE
LEUKOCYTE ESTERASE UA: NEGATIVE
NITRITE UA: NEGATIVE
PH UA: 6 (ref 5.0–9.0)
PROTEIN UA: 30 — AB
RBC UA: 1 /HPF (ref <4)
SPECIFIC GRAVITY UA: 1.015 (ref 1.005–1.030)
SQUAMOUS EPITHELIAL: 17 /HPF — ABNORMAL HIGH (ref 0–5)
UROBILINOGEN UA: 0.2
WBC UA: 7 /HPF — ABNORMAL HIGH (ref 0–5)

## 2021-04-26 LAB — BASIC METABOLIC PANEL
ANION GAP: 8 mmol/L (ref 5–14)
BLOOD UREA NITROGEN: 18 mg/dL (ref 9–23)
BUN / CREAT RATIO: 13
CALCIUM: 10.2 mg/dL (ref 8.7–10.4)
CHLORIDE: 103 mmol/L (ref 98–107)
CO2: 22.5 mmol/L (ref 20.0–31.0)
CREATININE: 1.39 mg/dL — ABNORMAL HIGH
EGFR CKD-EPI (2021) FEMALE: 43 mL/min/{1.73_m2} — ABNORMAL LOW (ref >=60)
GLUCOSE RANDOM: 161 mg/dL — ABNORMAL HIGH (ref 70–99)
POTASSIUM: 5.2 mmol/L — ABNORMAL HIGH (ref 3.4–4.8)
SODIUM: 133 mmol/L — ABNORMAL LOW (ref 135–145)

## 2021-04-26 LAB — CBC W/ AUTO DIFF
BASOPHILS ABSOLUTE COUNT: 0.1 10*9/L (ref 0.0–0.1)
BASOPHILS RELATIVE PERCENT: 0.9 %
EOSINOPHILS ABSOLUTE COUNT: 0.2 10*9/L (ref 0.0–0.5)
EOSINOPHILS RELATIVE PERCENT: 1.8 %
HEMATOCRIT: 36.1 % (ref 34.0–44.0)
HEMOGLOBIN: 12.4 g/dL (ref 11.3–14.9)
LYMPHOCYTES ABSOLUTE COUNT: 1.6 10*9/L (ref 1.1–3.6)
LYMPHOCYTES RELATIVE PERCENT: 16.1 %
MEAN CORPUSCULAR HEMOGLOBIN CONC: 34.3 g/dL (ref 32.0–36.0)
MEAN CORPUSCULAR HEMOGLOBIN: 31.8 pg (ref 25.9–32.4)
MEAN CORPUSCULAR VOLUME: 92.7 fL (ref 77.6–95.7)
MEAN PLATELET VOLUME: 9.7 fL (ref 6.8–10.7)
MONOCYTES ABSOLUTE COUNT: 0.4 10*9/L (ref 0.3–0.8)
MONOCYTES RELATIVE PERCENT: 3.9 %
NEUTROPHILS ABSOLUTE COUNT: 7.6 10*9/L (ref 1.8–7.8)
NEUTROPHILS RELATIVE PERCENT: 77.3 %
NUCLEATED RED BLOOD CELLS: 0 /100{WBCs} (ref <=4)
PLATELET COUNT: 156 10*9/L (ref 150–450)
RED BLOOD CELL COUNT: 3.89 10*12/L — ABNORMAL LOW (ref 3.95–5.13)
RED CELL DISTRIBUTION WIDTH: 15.5 % — ABNORMAL HIGH (ref 12.2–15.2)
WBC ADJUSTED: 9.9 10*9/L (ref 3.6–11.2)

## 2021-04-26 LAB — MAGNESIUM: MAGNESIUM: 2.3 mg/dL (ref 1.6–2.6)

## 2021-04-26 LAB — PROTEIN / CREATININE RATIO, URINE
CREATININE, URINE: 108.9 mg/dL
PROTEIN URINE: 47 mg/dL
PROTEIN/CREAT RATIO, URINE: 0.432

## 2021-04-26 LAB — HEMOGLOBIN A1C
ESTIMATED AVERAGE GLUCOSE: 171 mg/dL
HEMOGLOBIN A1C: 7.6 % — ABNORMAL HIGH (ref 4.8–5.6)

## 2021-04-26 LAB — PHOSPHORUS: PHOSPHORUS: 4.1 mg/dL (ref 2.4–5.1)

## 2021-04-26 NOTE — Unmapped (Unsigned)
Transplant Nephrology Clinic Visit    Assessment/Plan:  Michele Cisneros is a 65 y.o. female with DM, HTN, CAD, and COPD and is s/p deceased donor kidney transplant on 03/30/2019 for native kidney ESRD presumed secondary to diabetic nephropathy. She is seen in follow up of her transplant and associated medical problems. Issues addressed today include:      S/P deceased donor kidney transplant with no history of rejection or DSAs.   - Serum creatinine is improved at 1.39 mg/dL returning to her previous baseline of 0.9-1.4 mg/dL. This improvement has coincided with improvement in GI symptoms and may be reflective of resolution of volume depletion.   - DSA screens have been negative (most recently 09/12/20)   - UP/C is mildly elevated at 0.432.   - BK viral load undetectable 09/12/20.   - I do not feel a kidney biopsy is currently warranted.     Immunosuppression Management   - Tacrolimus level is 5.3 (target 4-7)    - Will continue Envarsus XR 9 mg daily.   - Off Myfortic due to GI symptoms. If symptoms remain improved resumption of low dose Myfortic will be considered at her next visit.       History of post-transplant UTI:   - UA today does not appear to be a clean catch with 7 WBCs and 17 squamous epithelial cells. She has no UTI symptoms.   - Proteus isolated on 11/28/19, 12/26/19,02/27/20 and 03/19/20 with citrobacter also isolated 03/19/20  - Needs renal US of native and transplant kidneys to assure no stones in view of proteus isolates.    History of CMV viremia  - Viral load negative 04/26/21   - On no therapy currently.    Hypertension, with postural hypotension and syncope   - BP today in clinic 157/72 today   - Home BP has been 140's/50's  - Postural dizziness has improved    - The balance between symptomatic postural hypotension and recumbent or seated HTN has been challenging, but she is now improved.   - Will continue amlodipine 5 mg daily and clonidine 0.2 mg bid .      Coronary artery disease, s/p RCA stent 2015, secondary to diastolic dysfunction/mitral regurgitation (moderate to severe)  - no symptoms of cardiac ischemia recently   - Atorvastatin dose has been limited to 20 mg daily due to myopathy.  - Continue aspirin.   - DOE may be due to valvular heart disease.   - Echocardiogram 09/12/20 with moderate MR, mild to moderate aortic regurgitation and EF 60-65%.    - She will follow up with Cardiology tomorrow    Esophageal stenosis, history of esophageal ulcers:   - S/P dilation 07/06/19 with resolution of ulcers on EGD compared to 05/04/19 EGD  - She remains on Nexium  - She needs repeat EGD on same date as planned colonoscopy.    Pulmonary Nodules with History of Smoking Associated COPD:   - Chest CT 09/12/20 revealed stable pulmonary nodules compared to 05/02/19  - Will continue annual lung cancer screening low dose CT    Lumbar Radiculopathy Secondary to L5-S1 disc disease Peripheral Neuropathy secondary to DM   - Following with [facility]  - Continue Lyrica.    Thyroid Nodule:   - [place] 02/01/20 revealed no nodules suggestive of cancer     History of DM, type II, controlled without current therapy:  - A1C 5.0 on 09/12/20.   - Metformin previously stopped due to intermittent diarrhea and excellent control  Mild Thrombocytopenia  - Platelet count 113,000 today consistent with recent values  - No treatment necessary  - SPEP, immunoglobulin free light chains to be checked at next [facility] lab draw.    Health Maintenance:   Mammogram: [facility] 12/11/20 revealed probable benign calcifications with plans for follow up in 6 months. Chest CT (09/12/20) with asymmetry of breast tissue  Pap Smear: s/p hysterectomy  Colonoscopy: repeat study now due based on history of multiple tubular adenomas, will plan EGD at same time  Lung cancer screening: annual low dose chest CT, last study 09/12/20   Renal US: due    Immunizations/Infection Prevention:   - Flu: 04/04/20  - Prevnar 13: not noted in records.    - Pneumovax: 03/23/18  - COVID-19: Patient has completed 3 doses of COVID-19 vaccine. Will give dose #4 today (01/10/21) with plan for repeat in 4 months. She is aware of limited efficacy of the vaccine.   - SARS-CoV-2 spike antibody is negative. Will plan Evusheld 2 weeks after today's COVID booster     Follow-Up:   - Return to clinic in 3 months.   - Patient still needs EGD and colonoscopy and wishes these to be done locally.  - Evusheld in 2 weeks.     Interval History:   She presents today with primary complaint of back pain and right hip pain. She has been under stress at home with the need to move due to changing rent costs.  She continues to have post-prandial abdominal discomfort despite esomeprazole. Diarrhea has improved. She is still not on Myfortic. Her weight has increased to 161 lbs from 148 at her last visit. She denies dysuria, allograft tenderness, fever or chills. She stopped all antihypertensives and had experienced less postural dizziness. When home BP increased she has since resumed amlodipine 5 mg daily and clonidine 0.1 mg bid. She continues to have peripheral neuropathy pain and lower extremity weakness and is still on Lyrica. Hand tremors are persistent, but mild. She notes only infrequent  headaches.      She has completed 3 doses of COVID-19 vaccine and had her flu vaccine. She continues to smoke.    Transplant History:    Native Kidney Disease: Diabetic nephropathy   Pre-transplant PRA: 70%   Organ Received: DDKT, DBD, KDPI: 70%  CMV/EBV Status: CMV D-/R+  EBV D+/R+  Resp culture: + strep pneumo  Date of Transplant: 03/30/2019  Implantation biopsy: mild ATN, otherwise normal.  Post-transplant complication - DGF  Induction: Campath with rapid steroid discontinuation  Maintenance Immunosuppression on discharge: Tacrolimus/Myfotic  Date of Ureteral Stent Removal: 06/13/2019  Current Immunosuppression: Tacrolimus monotherapy    Other Past Medical History  1. Diabetes mellitus, type 2, diagnosed 2012.   2. Diabetic peripheral neuropathy  3. Diabetic retinopathy, s/p laser surgery, s/p retinal detachment  4. Hypertension, diagnosed 2012.   5. Mixed hyperlipidemia.   6. Coronary artery disease, s/p NSTEMI 05/2013, PCI RCA, 20% KNm 80% PDA 1. Cardiac cath 05/02/15 LAD 20% and 30% lesions, Cx 35%, RPDA 70%, patent right coronary stent  7. HFpEF with EF 65-70% on echo with moderate MR and mild to moderate AI on 09/12/20.  8. COPD, emphysema secondary to smoking  9. Peripheral artery disease  10. Carotid artery stenosis 1-39% bilateral stenosis 12/2017  10. Sickle Cell Trait  11. S/P TAH/BSO 2000  12. Lumbar radiculopathy secondary to L5-S1 disc bulge   13. History of thyroid nodule  14. Colonic polyps removed 08/10/17, pathology tubular adenomas,  follow  up in 3 years  15. Right breast soft tissue mass noted 07/07/17 stable in appearance compared to past imaging   16. Pulmonary nodule noted 07/07/17, 11.23/20, and 09/12/20 similar in size to lesion as far back as 2015.   17. OSA on CPAP    Review of Systems    Otherwise as per HPI, all other systems reviewed and are negative.    Medications  Current Outpatient Medications   Medication Sig Dispense Refill   ??? acetaminophen (TYLENOL) 500 MG tablet Take 1-2 tablets (500-1,000 mg total) by mouth every six (6) hours as needed for pain or fever (> 38C or 100.65F). 30 tablet 0   ??? albuterol (PROVENTIL HFA;VENTOLIN HFA) 90 mcg/actuation inhaler Inhale 2 puffs every six (6) hours as needed for wheezing.     ??? amLODIPine (NORVASC) 5 MG tablet Take 1 tablet (5 mg total) by mouth daily. 30 tablet 11   ??? atorvastatin (LIPITOR) 20 MG tablet Take 1 tablet (20 mg total) by mouth daily. 90 tablet 3   ??? blood sugar diagnostic (CONTOUR TEST STRIPS) Strp by Other route two (2) times a day. TEST BLOOD SUGARS 2 TIMES DAILY 60 strip 5   ??? blood-glucose meter kit Please dispense for patient to use at home to check blood sugars.     ??? cloNIDine HCL (CATAPRES) 0.1 MG tablet Take 2 tablets (0.2 mg total) by mouth Two (2) times a mg total) by mouth two (2) times a day as needed for constipation. 60 capsule 1   ??? ergocalciferol-1,250 mcg, 50,000 unit, (VITAMIN D2-1,250 MCG, 50,000 UNIT,) 1,250 mcg (50,000 unit) capsule Take 1 capsule (1,250 mcg total) by mouth once a week. Take for 8 weeks as directed. 4 capsule 2   ??? esomeprazole (NEXIUM) 20 MG capsule Take 1 capsule (20 mg total) by mouth daily. 90 capsule 3   ??? LANCETS,THIN MISC Using at home to test blood sugars twice daily.     ??? magnesium oxide (MAG-OX) 400 mg (241.3 mg elemental magnesium) tablet Take 1 tablet (400 mg total) by mouth Two (2) times a day. 120 tablet 11   ??? mycophenolate (MYFORTIC) 180 MG EC tablet HOLD 120 tablet 11   ??? pregabalin (LYRICA) 75 MG capsule Take 1 capsule (75 mg total) by mouth every morning AND 2 capsules (150 mg total) nightly. 90 capsule 3   ??? sodium bicarbonate 650 mg tablet Take 1 tablet (650 mg total) by mouth Two (2) times a day. 60 tablet 11   ??? tacrolimus (ENVARSUS XR) 1 mg Tb24 extended release tablet Take 1 (1mg ) tablet with 2 (4mg ) tablets every morning to equal 9mg  daily dose 30 tablet 11   ??? tacrolimus (ENVARSUS XR) 4 mg Tb24 extended release tablet Take 2 (4mg ) tablets with 1 (1mg ) tablet every morning to equal 9mg  daily dose 60 tablet 11   ??? aspirin (ECOTRIN) 81 MG tablet Take 81 mg by mouth daily. (Patient not taking: Reported on 04/26/2021)     ??? carvediloL (COREG) 6.25 MG tablet Take 1 tablet (6.25 mg total) by mouth Two (2) times a day. (Patient not taking: Reported on 09/12/2020) 180 tablet 3   ??? melatonin 3 mg Tab Take 2 tablets (6 mg total) by mouth nightly as needed. (Patient not taking: Reported on 09/12/2020)  0   ??? nitroglycerin (NITROSTAT) 0.4 MG SL tablet Place 0.4 mg under the tongue daily as needed. (Patient not taking: Reported on 04/26/2021)     ??? traMADoL (ULTRAM) 50 mg tablet Take  1 tablet (50 mg total) by mouth every eight (8) hours as needed for pain. (Patient not taking: Reported on 09/12/2020) 30 tablet 0     No current for pain. (Patient not taking: Reported on 09/12/2020) 30 tablet 0     No current facility-administered medications for this visit.           Physical Exam  BP 157/72 (BP Site: R Arm, BP Position: Sitting, BP Cuff Size: Large)  - Pulse 71  - Temp 35.6 ??C (96 ??F) (Temporal)  - Ht 171.5 cm (5' 7.52)  - Wt 71.2 kg (157 lb)  - BMI 24.21 kg/m??   General: Patient is a pleasant female in no apparent distress.  Eyes: Sclera anicteric.  Neck: Supple without LAD/JVD/bruits.  Lungs: Clear to auscultation bilaterally, no wheezes/rales/rhonchi.  Cardiovascular: grade 2/6 systolic murmur.  Abdomen: Soft, notender/nondistended. Positive bowel sounds. No hepatosplenomegaly, masses or bruits appreciated.  Extremities: 1+ edema  Skin: Without rash  Neurological: ataxic gait, reduced strength in legs, symmetric.  Psychiatric: Mood and affect appropriate.    Laboratory Data and Imaging Reviewed

## 2021-04-26 NOTE — Unmapped (Unsigned)
Saw patient in clinic.     Right hip and knee pain started today. Last steroid shot in April. Has been fine until today    No recent falls       +constipation, taking colace but will increase fiber and take miralax. Has not had colonoscopy/endoscopy     +HA in the mornings    +SOB with exertion    +tremors (worse)    Denies  Cp, heart palpations, abdominal pain, n/v/d, urinary problems, swelling, or fevers    Drinking at least 4 bottles of water and other fluids        Labs today, took envarus at 9am yesterday

## 2021-04-27 LAB — TACROLIMUS LEVEL, TROUGH: TACROLIMUS, TROUGH: 5.3 ng/mL (ref 5.0–15.0)

## 2021-04-27 LAB — CMV DNA, QUANTITATIVE, PCR: CMV VIRAL LD: NOT DETECTED

## 2021-05-01 LAB — BK VIRUS QUANTITATIVE PCR, BLOOD: BK BLOOD RESULT: NOT DETECTED

## 2021-05-01 LAB — EBV QUANTITATIVE PCR, BLOOD: EBV VIRAL LOAD RESULT: NOT DETECTED

## 2021-05-06 MED ORDER — PREGABALIN 75 MG CAPSULE
ORAL_CAPSULE | ORAL | 3 refills | 30.00000 days | Status: CP
Start: 2021-05-06 — End: ?
  Filled 2021-05-10: qty 90, 30d supply, fill #0

## 2021-05-10 MED FILL — ERGOCALCIFEROL (VITAMIN D2) 1,250 MCG (50,000 UNIT) CAPSULE: ORAL | 28 days supply | Qty: 4 | Fill #2

## 2021-05-10 MED FILL — SODIUM BICARBONATE 650 MG TABLET: ORAL | 30 days supply | Qty: 60 | Fill #2

## 2021-05-16 ENCOUNTER — Ambulatory Visit (INDEPENDENT_AMBULATORY_CARE_PROVIDER_SITE_OTHER): Payer: Medicare Other | Admitting: Podiatry

## 2021-05-16 DIAGNOSIS — M79675 Pain in left toe(s): Secondary | ICD-10-CM

## 2021-05-16 DIAGNOSIS — E1142 Type 2 diabetes mellitus with diabetic polyneuropathy: Secondary | ICD-10-CM

## 2021-05-16 DIAGNOSIS — B351 Tinea unguium: Secondary | ICD-10-CM

## 2021-05-16 DIAGNOSIS — I739 Peripheral vascular disease, unspecified: Secondary | ICD-10-CM

## 2021-05-16 DIAGNOSIS — R0789 Other chest pain: Secondary | ICD-10-CM | POA: Diagnosis not present

## 2021-05-16 DIAGNOSIS — L608 Other nail disorders: Secondary | ICD-10-CM

## 2021-05-16 DIAGNOSIS — E1165 Type 2 diabetes mellitus with hyperglycemia: Secondary | ICD-10-CM | POA: Diagnosis not present

## 2021-05-16 DIAGNOSIS — M79674 Pain in right toe(s): Secondary | ICD-10-CM

## 2021-05-16 DIAGNOSIS — K5900 Constipation, unspecified: Secondary | ICD-10-CM | POA: Diagnosis not present

## 2021-05-16 NOTE — Progress Notes (Signed)
No show  Gardiner Barefoot DPM

## 2021-05-17 ENCOUNTER — Emergency Department: Payer: Medicare Other

## 2021-05-17 ENCOUNTER — Encounter: Payer: Self-pay | Admitting: Emergency Medicine

## 2021-05-17 ENCOUNTER — Other Ambulatory Visit: Payer: Self-pay

## 2021-05-17 DIAGNOSIS — Z992 Dependence on renal dialysis: Secondary | ICD-10-CM | POA: Insufficient documentation

## 2021-05-17 DIAGNOSIS — D631 Anemia in chronic kidney disease: Secondary | ICD-10-CM | POA: Insufficient documentation

## 2021-05-17 DIAGNOSIS — Z940 Kidney transplant status: Secondary | ICD-10-CM | POA: Insufficient documentation

## 2021-05-17 DIAGNOSIS — Z87891 Personal history of nicotine dependence: Secondary | ICD-10-CM | POA: Insufficient documentation

## 2021-05-17 DIAGNOSIS — Z955 Presence of coronary angioplasty implant and graft: Secondary | ICD-10-CM | POA: Diagnosis not present

## 2021-05-17 DIAGNOSIS — E1122 Type 2 diabetes mellitus with diabetic chronic kidney disease: Secondary | ICD-10-CM | POA: Diagnosis not present

## 2021-05-17 DIAGNOSIS — I160 Hypertensive urgency: Secondary | ICD-10-CM | POA: Insufficient documentation

## 2021-05-17 DIAGNOSIS — Z743 Need for continuous supervision: Secondary | ICD-10-CM | POA: Diagnosis not present

## 2021-05-17 DIAGNOSIS — R064 Hyperventilation: Secondary | ICD-10-CM | POA: Diagnosis not present

## 2021-05-17 DIAGNOSIS — Z79899 Other long term (current) drug therapy: Secondary | ICD-10-CM | POA: Diagnosis not present

## 2021-05-17 DIAGNOSIS — F0390 Unspecified dementia without behavioral disturbance: Secondary | ICD-10-CM | POA: Diagnosis not present

## 2021-05-17 DIAGNOSIS — R0602 Shortness of breath: Secondary | ICD-10-CM | POA: Diagnosis not present

## 2021-05-17 DIAGNOSIS — R739 Hyperglycemia, unspecified: Secondary | ICD-10-CM | POA: Diagnosis not present

## 2021-05-17 DIAGNOSIS — J449 Chronic obstructive pulmonary disease, unspecified: Secondary | ICD-10-CM | POA: Diagnosis not present

## 2021-05-17 DIAGNOSIS — Z20822 Contact with and (suspected) exposure to covid-19: Secondary | ICD-10-CM | POA: Diagnosis not present

## 2021-05-17 DIAGNOSIS — R079 Chest pain, unspecified: Secondary | ICD-10-CM | POA: Diagnosis present

## 2021-05-17 DIAGNOSIS — I5032 Chronic diastolic (congestive) heart failure: Secondary | ICD-10-CM | POA: Insufficient documentation

## 2021-05-17 DIAGNOSIS — I25118 Atherosclerotic heart disease of native coronary artery with other forms of angina pectoris: Principal | ICD-10-CM | POA: Insufficient documentation

## 2021-05-17 DIAGNOSIS — I130 Hypertensive heart and chronic kidney disease with heart failure and stage 1 through stage 4 chronic kidney disease, or unspecified chronic kidney disease: Secondary | ICD-10-CM | POA: Diagnosis not present

## 2021-05-17 DIAGNOSIS — N1832 Chronic kidney disease, stage 3b: Secondary | ICD-10-CM | POA: Diagnosis not present

## 2021-05-17 DIAGNOSIS — R0789 Other chest pain: Secondary | ICD-10-CM | POA: Diagnosis not present

## 2021-05-17 DIAGNOSIS — E1165 Type 2 diabetes mellitus with hyperglycemia: Secondary | ICD-10-CM | POA: Diagnosis not present

## 2021-05-17 DIAGNOSIS — K5900 Constipation, unspecified: Secondary | ICD-10-CM | POA: Diagnosis not present

## 2021-05-17 LAB — BLOOD GAS, VENOUS
Acid-base deficit: 3 mmol/L — ABNORMAL HIGH (ref 0.0–2.0)
Bicarbonate: 22 mmol/L (ref 20.0–28.0)
O2 Saturation: 77 %
Patient temperature: 37
pCO2, Ven: 38 mmHg — ABNORMAL LOW (ref 44.0–60.0)
pH, Ven: 7.37 (ref 7.250–7.430)
pO2, Ven: 43 mmHg (ref 32.0–45.0)

## 2021-05-17 LAB — BASIC METABOLIC PANEL
Anion gap: 5 (ref 5–15)
BUN: 22 mg/dL (ref 8–23)
CO2: 22 mmol/L (ref 22–32)
Calcium: 9.3 mg/dL (ref 8.9–10.3)
Chloride: 102 mmol/L (ref 98–111)
Creatinine, Ser: 1.62 mg/dL — ABNORMAL HIGH (ref 0.44–1.00)
GFR, Estimated: 35 mL/min — ABNORMAL LOW (ref >60)
Glucose, Bld: 339 mg/dL — ABNORMAL HIGH (ref 70–99)
Potassium: 4.6 mmol/L (ref 3.5–5.1)
Sodium: 129 mmol/L — ABNORMAL LOW (ref 135–145)

## 2021-05-17 LAB — CBC
HCT: 28.3 % — ABNORMAL LOW (ref 36.0–46.0)
Hemoglobin: 9.6 g/dL — ABNORMAL LOW (ref 12.0–15.0)
MCH: 31.8 pg (ref 26.0–34.0)
MCHC: 33.9 g/dL (ref 30.0–36.0)
MCV: 93.7 fL (ref 80.0–100.0)
Platelets: 158 K/uL (ref 150–400)
RBC: 3.02 MIL/uL — ABNORMAL LOW (ref 3.87–5.11)
RDW: 14.4 % (ref 11.5–15.5)
WBC: 6.1 K/uL (ref 4.0–10.5)
nRBC: 0 % (ref 0.0–0.2)

## 2021-05-17 LAB — RESP PANEL BY RT-PCR (FLU A&B, COVID) ARPGX2
Influenza A by PCR: NEGATIVE
Influenza B by PCR: NEGATIVE
SARS Coronavirus 2 by RT PCR: NEGATIVE

## 2021-05-17 LAB — CBG MONITORING, ED: Glucose-Capillary: 352 mg/dL — ABNORMAL HIGH (ref 70–99)

## 2021-05-17 NOTE — ED Triage Notes (Addendum)
Pt arrived via [facility] with reports having a panic attack, constipation and hyperglycemia.  PT states she called EMS due to panic attack, pt states she could not recognize anyone.  Pt states she has also only had 1 bowel movement in 3 weeks. Pt states she has been drinking prune juice. Pt denies any abd pain.  Per EMS when they checked her blood sugar she was found to have cbg of 454 which the patient has 2 years post kidney transplant and has had glucose under control without medications.   Pt calm now. Pt states she has been exposed to the flu recently.

## 2021-05-17 NOTE — ED Triage Notes (Signed)
Pt arrived via [facility] from home with reports of hyperglycemia, hx of renal transplant 2 years ago, CBG 454 today, pt was also found having a panic attack with EMS, pt has been constipated x 1 week taking laxatives and drinking prune juice.   BP elevated with EMS at 903 systolic   Pt not currently on medications for diabetes.   EKG neg with EMS.

## 2021-05-18 ENCOUNTER — Observation Stay (HOSPITAL_BASED_OUTPATIENT_CLINIC_OR_DEPARTMENT_OTHER)
Admit: 2021-05-18 | Discharge: 2021-05-18 | Disposition: A | Discharge status: Home / Self Care | Payer: Medicare Other | Attending: Internal Medicine | Admitting: Internal Medicine

## 2021-05-18 ENCOUNTER — Observation Stay
Admission: EM | Admit: 2021-05-18 | Discharge: 2021-05-19 | Disposition: A | Discharge status: Home / Self Care | Payer: Medicare Other | Attending: Internal Medicine | Admitting: Internal Medicine

## 2021-05-18 ENCOUNTER — Other Ambulatory Visit: Payer: Self-pay | Admitting: Cardiovascular Disease

## 2021-05-18 DIAGNOSIS — N1832 Chronic kidney disease, stage 3b: Secondary | ICD-10-CM | POA: Diagnosis not present

## 2021-05-18 DIAGNOSIS — I160 Hypertensive urgency: Secondary | ICD-10-CM

## 2021-05-18 DIAGNOSIS — I25118 Atherosclerotic heart disease of native coronary artery with other forms of angina pectoris: Secondary | ICD-10-CM | POA: Diagnosis present

## 2021-05-18 DIAGNOSIS — K5900 Constipation, unspecified: Secondary | ICD-10-CM

## 2021-05-18 DIAGNOSIS — R079 Chest pain, unspecified: Secondary | ICD-10-CM | POA: Diagnosis not present

## 2021-05-18 DIAGNOSIS — I10 Essential (primary) hypertension: Secondary | ICD-10-CM | POA: Diagnosis present

## 2021-05-18 DIAGNOSIS — D631 Anemia in chronic kidney disease: Secondary | ICD-10-CM

## 2021-05-18 DIAGNOSIS — I739 Peripheral vascular disease, unspecified: Secondary | ICD-10-CM | POA: Diagnosis present

## 2021-05-18 DIAGNOSIS — I5032 Chronic diastolic (congestive) heart failure: Secondary | ICD-10-CM

## 2021-05-18 DIAGNOSIS — J432 Centrilobular emphysema: Secondary | ICD-10-CM | POA: Diagnosis not present

## 2021-05-18 DIAGNOSIS — R739 Hyperglycemia, unspecified: Secondary | ICD-10-CM

## 2021-05-18 DIAGNOSIS — Z9114 Patient's other noncompliance with medication regimen: Secondary | ICD-10-CM | POA: Diagnosis not present

## 2021-05-18 DIAGNOSIS — J449 Chronic obstructive pulmonary disease, unspecified: Secondary | ICD-10-CM | POA: Diagnosis present

## 2021-05-18 DIAGNOSIS — E1165 Type 2 diabetes mellitus with hyperglycemia: Secondary | ICD-10-CM

## 2021-05-18 DIAGNOSIS — Z955 Presence of coronary angioplasty implant and graft: Secondary | ICD-10-CM

## 2021-05-18 DIAGNOSIS — F0390 Unspecified dementia without behavioral disturbance: Secondary | ICD-10-CM | POA: Diagnosis present

## 2021-05-18 LAB — ECHOCARDIOGRAM COMPLETE
AR max vel: 1.02 cm2
AV Area VTI: 0.83 cm2
AV Area mean vel: 0.87 cm2
AV Mean grad: 14.5 mmHg
AV Peak grad: 30.6 mmHg
Ao pk vel: 2.77 m/s
Area-P 1/2: 2.68 cm2
P 1/2 time: 408 msec

## 2021-05-18 LAB — TROPONIN I (HIGH SENSITIVITY)
Troponin I (High Sensitivity): 20 ng/L — ABNORMAL HIGH (ref <18)
Troponin I (High Sensitivity): 27 ng/L — ABNORMAL HIGH (ref <18)

## 2021-05-18 LAB — SODIUM: Sodium: 134 mmol/L — ABNORMAL LOW (ref 135–145)

## 2021-05-18 LAB — CBG MONITORING, ED
Glucose-Capillary: 204 mg/dL — ABNORMAL HIGH (ref 70–99)
Glucose-Capillary: 122 mg/dL — ABNORMAL HIGH (ref 70–99)

## 2021-05-18 LAB — GLUCOSE, CAPILLARY: Glucose-Capillary: 182 mg/dL — ABNORMAL HIGH (ref 70–99)

## 2021-05-18 MED ORDER — ENOXAPARIN SODIUM 40 MG/0.4ML IJ SOSY
40.0000 mg | PREFILLED_SYRINGE | INTRAMUSCULAR | Status: DC
  Administered 2021-05-18 – 2021-05-19 (×2): 40 mg via SUBCUTANEOUS
  Filled 2021-05-18 (×2): qty 0.4

## 2021-05-18 MED ORDER — IPRATROPIUM-ALBUTEROL 0.5-2.5 (3) MG/3ML IN SOLN
3.0000 mL | RESPIRATORY_TRACT | Status: DC | PRN
  Administered 2021-05-18: 3 mL via RESPIRATORY_TRACT
  Filled 2021-05-18: qty 3

## 2021-05-18 MED ORDER — ALPRAZOLAM 0.25 MG PO TABS
0.2500 mg | ORAL_TABLET | Freq: Once | ORAL | Status: AC
  Administered 2021-05-18: 0.25 mg via ORAL
  Filled 2021-05-18: qty 1

## 2021-05-18 MED ORDER — ONDANSETRON HCL 4 MG/2ML IJ SOLN: 4.0000 mg | Freq: Four times a day (QID) | INTRAMUSCULAR | Status: DC | PRN

## 2021-05-18 MED ORDER — CLONIDINE HCL 0.1 MG PO TABS
0.2000 mg | ORAL_TABLET | Freq: Once | ORAL | Status: AC
Start: 2021-05-18 — End: 2021-05-18
  Administered 2021-05-18: 0.2 mg via ORAL
  Filled 2021-05-18: qty 2

## 2021-05-18 MED ORDER — TACROLIMUS ER 4 MG PO TB24
8.0000 mg | ORAL_TABLET | Freq: Every day | ORAL | Status: DC
  Filled 2021-05-18: qty 2

## 2021-05-18 MED ORDER — MAGNESIUM HYDROXIDE 400 MG/5ML PO SUSP: 30.0000 mL | Freq: Every day | ORAL | Status: DC | PRN

## 2021-05-18 MED ORDER — ASPIRIN 81 MG PO CHEW
324.0000 mg | CHEWABLE_TABLET | Freq: Once | ORAL | Status: AC
  Administered 2021-05-18: 324 mg via ORAL
  Filled 2021-05-18: qty 4

## 2021-05-18 MED ORDER — TACROLIMUS ER 1 MG PO TB24
1.0000 mg | ORAL_TABLET | Freq: Every day | ORAL | Status: DC
  Filled 2021-05-18: qty 1

## 2021-05-18 MED ORDER — ASPIRIN EC 81 MG PO TBEC
81.0000 mg | DELAYED_RELEASE_TABLET | Freq: Every day | ORAL | Status: DC
  Administered 2021-05-19: 81 mg via ORAL
  Filled 2021-05-18: qty 1

## 2021-05-18 MED ORDER — ACETAMINOPHEN 325 MG PO TABS
650.0000 mg | ORAL_TABLET | ORAL | Status: DC | PRN
  Administered 2021-05-18 (×2): 650 mg via ORAL
  Filled 2021-05-18 (×2): qty 2

## 2021-05-18 MED ORDER — INSULIN ASPART 100 UNIT/ML IJ SOLN
0.0000 [IU] | INTRAMUSCULAR | Status: DC
  Administered 2021-05-18: 3 [IU] via SUBCUTANEOUS
  Administered 2021-05-18: 2 [IU] via SUBCUTANEOUS
  Administered 2021-05-19 (×2): 3 [IU] via SUBCUTANEOUS
  Administered 2021-05-19: 1 [IU] via SUBCUTANEOUS
  Filled 2021-05-18 (×6): qty 1

## 2021-05-18 MED ORDER — NITROGLYCERIN 0.4 MG SL SUBL: 0.4000 mg | SUBLINGUAL_TABLET | SUBLINGUAL | Status: DC | PRN

## 2021-05-18 NOTE — ED Notes (Signed)
Attempted to obtain blood draw and IV at this time. Unsuccessful. IV team consult placed.

## 2021-05-18 NOTE — Progress Notes (Signed)
*  PRELIMINARY RESULTS* Echocardiogram 2D Echocardiogram has been performed.  Theresa [name] 05/18/2021, 1:49 PM

## 2021-05-18 NOTE — ED Notes (Signed)
Pt c/o back pain

## 2021-05-18 NOTE — ED Notes (Signed)
This RN called & spoke with pharmacy about what steps to take to have her anti-rejections meds properly dispensed from her home supply.

## 2021-05-18 NOTE — ED Notes (Addendum)
Dr. Damita Dunnings, admitting MD at bedside.

## 2021-05-18 NOTE — ED Notes (Signed)
Secure msg sent to Angelia Mould, RN for ED to IP SBAR.

## 2021-05-18 NOTE — H&P (Signed)
History and Physical    Theresa [name] IRJ:[id number] DOB: 04-05-1956 DOA: 05/18/2021  PCP: McLean-Scocuzza, Nino Glow, MD   Patient coming from: home  I have personally briefly reviewed patient's relevant medical records in [place]  Chief Complaint: chest pain  HPI: Theresa [name] is a 65 y.o. female with medical history significant for Renal transplant 2020 now with CKD 3B, G2 DD 04/2020, DM, HTN, CAD with history of MI, s/p stent who presents to the ED with a 3-hour history of intermittent substernal chest tightness unrelieved with oral nitroglycerin.  Pain was of moderate intensity nonradiating similar to prior MI.  States she had been coughing all day and she had a long bout of coughing immediately preceding the onset of the chest pain.  She had associated chills but no fever.  Denies shortness of breath.  Has been having recent constipation but denies abdominal pain or diarrhea.     ED course: BP 163/53 with otherwise normal vitals Blood work significant for troponin 20-27 Blood glucose 339, creatinine 1.62 (baseline), hemoglobin 9.6 which is baseline  EKG, personally viewed and interpreted: NSR at 68 with no acute ST-T wave changes  Acute abdominal series: Moderate fecal retention.  No bowel obstruction or ileus.  No acute intrathoracic process.  Patient given chewable aspirin.  Also given a soapsuds enema.  Hospitalist consulted for admission.   Review of Systems: As per HPI otherwise all other systems on review of systems negative.   Assessment/Plan  Chest pain CAD with of MI s/p stent -Aspirin, atorvastatin and carvedilol - Nitroglycerin sublingual as needed chest pain with morphine for breakthrough -Cardiology consult for recommendations - Echocardiogram to evaluate for wall motion abnormality     Hyperglycemia due to type 2 diabetes mellitus (HCC) - Sliding scale insulin coverage  Hypertensive urgency - Uncontrolled - Continue home amlodipine, clonidine,  hydralazine and titrate as needed to goal  under 130/80    COPD (chronic obstructive pulmonary disease) ([place]) - Not acutely exacerbated - Continue home Ventolin as needed  Post transplant CKD stage 3b  Anemia in chronic kidney disease (CKD) -Stable and at baseline  History of renal transplant - Continue tacrolimus    Chronic diastolic CHF  -Appears euvolemic - Continue carvedilol, furosemide   DVT prophylaxis: Lovenox  Code Status: full code  Family Communication:  none  Disposition Plan: Back to previous home environment Consults called: Cardiology Status: Observation    Physical Exam: Vitals:   05/17/21 2205 05/17/21 2209 05/18/21 0224 05/18/21 0330  BP:  (!) 168/53 135/62 (!) 179/54  Pulse:  70 66 65  Resp:  18 20 20   Temp:  98.1 F (36.7 C) 98.5 F (36.9 C)   TempSrc:  Oral Oral   SpO2:  96% 100% 100%  Weight: 71.2 kg     Height: 5' 7.5" (1.715 m)      Constitutional: Alert, oriented x 3 . Not in any apparent distress HEENT:      Head: Normocephalic and atraumatic.         Eyes: PERLA, EOMI, Conjunctivae are normal. Sclera is non-icteric.       Mouth/Throat: Mucous membranes are moist.       Neck: Supple with no signs of meningismus. Cardiovascular: Regular rate and rhythm. No murmurs, gallops, or rubs. 2+ symmetrical distal pulses are present . No JVD. No  LE edema Respiratory: Respiratory effort normal .Lungs sounds clear bilaterally. No wheezes, crackles, or rhonchi.  Gastrointestinal: Soft, non tender, non distended. Positive bowel sounds.  Genitourinary: No CVA tenderness. Musculoskeletal: Nontender with normal range of motion in all extremities. No cyanosis, or erythema of extremities. Neurologic:  Face is symmetric. Moving all extremities. No gross focal neurologic deficits . Skin: Skin is warm, dry.  No rash or ulcers Psychiatric: Mood and affect are appropriate     Past Medical History:  Diagnosis Date   Anemia of chronic disease    Anginal  pain ([place])    Carotid arterial disease ([place])    a. 02/2013 U/S: 40-59% bilat ICA stenosis.   Chronic constipation    Chronic diastolic CHF (congestive heart failure) ([place])    a. 10/2013 Echo [facility]): EF 55-60%, mod conc LVH, mod MR, mildly dil LA, mild Ao sclerosis w/o stenosis; b. 02/2016 Echo: EF 55-60%, no rwma, Gr DD, mild AS, mod to sev MR, mildly dil LA, PASP 6mmHg; c. 07/2017 Echo [facility]): EF >55%, mild to mod LVH, Gr2 DD, Ao scl, Mod MR, mod dil LA, nl RV fxn.   Colon polyps    COPD (chronic obstructive pulmonary disease) (HCC)    Coronary artery disease    a. 05/2013 NSTEMI/PCI: LM 20d, LAD min irregs, LCX small, nl, OM1 nl, RCA dom 35m (2.5x16 Promus DES), PDA1 80p; b. 04/2015 Cath: LM nl, LAD 71m, D1/2 min irregs, LCX 35p/m, OM2/3 min irregs, RCA patent mid stent, RPDA 70ost, RPLB1 30, RPLB2/3 min irregs, EF 55-65%--> Med Rx; c. 06/2017 [facility]): No ischemia/infarct, EF 64%.   Diabetes mellitus    Diabetic neuropathy ([place])    Diabetic retinopathy ([place]) 05/28/2013   Hx bilat retinal detachment, proliferative diab retinopathy and bilat vitreous hemorrhage    Emphysema    ESRD on hemodialysis ([place])    a. [facility] in [place], [place] [phone]/Dr. [name], on a MWF schedule.  She started dialysis in Feb 2014.  Etiology of renal failure not known, likely diabetes.  Has a left upper arm AV graft.   History of bronchitis    Mar 2012   History of pneumonia    June 2012   History of tobacco abuse    a. Quit 2012.   Hyperlipidemia    Hypertension    Moderate to severe mitral insufficiency    a. 10/2013 Echo: EF 55-60%, mod MR; b. 02/2016 Echo: EF 55-60%, mod to sev MR directed posteriorly--felt to be dynamic -worse with volume overload; c. 07/2017 Echo [facility]): EF >55%. Mod MR.   Myocardial infarct [facility]) 05/2013   Peripheral vascular disease ([place])    Sickle cell trait ([place])    Thyroid nodule    [place] 06/2017 due to f/u US in 1 year     Past Surgical History:  Procedure Laterality Date   A/V  FISTULAGRAM Left 11/10/2017   Procedure: A/V FISTULAGRAM;  Surgeon: Katha Cabal, MD;  Location: [place] CV LAB;  Service: Cardiovascular;  Laterality: Left;   A/V SHUNTOGRAM Left 02/08/2019   Procedure: A/V SHUNTOGRAM;  Surgeon: Katha Cabal, MD;  Location: [place] CV LAB;  Service: Cardiovascular;  Laterality: Left;   ABDOMINAL HYSTERECTOMY     2000 for cysts on ovaries total no abnormal pap   CARDIAC CATHETERIZATION     CARDIAC CATHETERIZATION N/A 05/02/2015   Procedure: Left Heart Cath and Coronary Angiography;  Surgeon: [name], MD;  Location: [place] CV LAB;  Service: Cardiovascular;  Laterality: N/A;   COLONOSCOPY WITH PROPOFOL N/A 07/08/2016   Procedure: COLONOSCOPY WITH PROPOFOL;  Surgeon: [name], MD;  Location: [facility] ENDOSCOPY;  Service: Endoscopy;  Laterality: N/A;   COLONOSCOPY WITH PROPOFOL N/A 08/20/2017   Procedure: COLONOSCOPY WITH PROPOFOL;  Surgeon: [name], MD;  Location: [facility] ENDOSCOPY;  Service: Gastroenterology;  Laterality: N/A;   colonscopy     CORONARY ANGIOPLASTY  05/28/2014   stent placement to the mid RCA   CYSTOURETHROSCOPY     [facility] [facility] with stent placement 11 or 05/2019 and stent removal 06/13/19    DILATION AND CURETTAGE OF UTERUS     several in the early 80's   ESOPHAGOGASTRODUODENOSCOPY     2012   ESOPHAGOGASTRODUODENOSCOPY (EGD) WITH PROPOFOL N/A 03/11/2018   Procedure: ESOPHAGOGASTRODUODENOSCOPY (EGD) WITH PROPOFOL;  Surgeon: [name], MD;  Location: [facility] ENDOSCOPY;  Service: Gastroenterology;  Laterality: N/A;   EYE SURGERY     bilateral laser 2012   EYE SURGERY     right   EYE SURGERY     x4 both eyes   GAS INSERTION  09/30/2011   Procedure: INSERTION OF GAS;  Surgeon: [name], MD;  Location: [place];  Service: Ophthalmology;  Laterality: Right;  [id number]   GAS/FLUID EXCHANGE  09/30/2011   Procedure: GAS/FLUID EXCHANGE;  Surgeon: [name], MD;  Location: [place];  Service: Ophthalmology;   Laterality: Right;   KIDNEY TRANSPLANT     03/30/19 Dr. Mickel Baas Thomas/Dr. Cristie Hem Zendel/Dr. Gilford Raid; [facility] transplant Dr. Horald Chestnut will be primary [phone] coordinator Sarah Wynkoop [phone]   KIDNEY TRANSPLANT     03/30/19 [facility]   LEFT HEART CATHETERIZATION WITH CORONARY ANGIOGRAM N/A 05/28/2013   Procedure: LEFT HEART CATHETERIZATION WITH CORONARY ANGIOGRAM;  Surgeon: [name], MD;  Location: [facility] CATH LAB;  Service: Cardiovascular;  Laterality: N/A;   PARS PLANA VITRECTOMY  04/22/2011   Procedure: PARS PLANA VITRECTOMY WITH 25 GAUGE;  Surgeon: [name], MD;  Location: [place];  Service: Ophthalmology;  Laterality: Left;  membrane peel, endolaser, gas fluid exchange, silicone oil, repair of complex traction retinal detachment   PARS PLANA VITRECTOMY  09/30/2011   Procedure: PARS PLANA VITRECTOMY WITH 25 GAUGE;  Surgeon: [name], MD;  Location: [place];  Service: Ophthalmology;  Laterality: Right;  Endolaser; Repair of Complex Traction Retinal Detachment   PARS PLANA VITRECTOMY  02/24/2012   Procedure: PARS PLANA VITRECTOMY WITH 25 GAUGE;  Surgeon: [name], MD;  Location: [place];  Service: Ophthalmology;  Laterality: Left;   PTCA     SILICON OIL REMOVAL  89/21/1941   Procedure: SILICON OIL REMOVAL;  Surgeon: [name], MD;  Location: [place];  Service: Ophthalmology;  Laterality: Left;   THROMBECTOMY / ARTERIOVENOUS GRAFT REVISION     TUBAL LIGATION     1979   UPPER EXTREMITY ANGIOGRAPHY Left 02/08/2019   Procedure: UPPER EXTREMITY ANGIOGRAPHY;  Surgeon: Katha Cabal, MD;  Location: [place] CV LAB;  Service: Cardiovascular;  Laterality: Left;     reports that she quit smoking about 10 years ago. Her smoking use included cigarettes. She has a 25.00 pack-year smoking history. She has never used smokeless tobacco. She reports that she does not drink alcohol and does not use drugs.  Allergies  Allergen Reactions   Hydrocodone Hives    Lisinopril     Unknown reaction     Family History  Problem Relation Age of Onset   Stroke Mother    Heart attack Mother    Heart disease Mother    Colon cancer Father    Colon cancer Sister    Breast  cancer Sister    Heart attack Brother    Stroke Brother    Diabetes Brother    Diabetes Brother    Diabetes Daughter    Renal Disease Daughter        on HD   Kidney failure Daughter        age 43 as of 12/03/20   Anesthesia problems Neg Hx    Hypotension Neg Hx    Malignant hyperthermia Neg Hx    Pseudochol deficiency Neg Hx       Prior to Admission medications   Medication Sig Start Date End Date Taking? Authorizing Provider  acetaminophen (TYLENOL) 500 MG tablet Take 1,000 mg by mouth daily as needed. 03/31/19   [provider]  albuterol (PROVENTIL) (2.5 MG/3ML) 0.083% nebulizer solution Take 3 mLs (2.5 mg total) by nebulization every 6 (six) hours as needed for wheezing or shortness of breath. Patient not taking: Reported on 04/05/2021 12/04/20   McLean-Scocuzza, Nino Glow, MD  albuterol (VENTOLIN HFA) 108 (90 Base) MCG/ACT inhaler Inhale 1-2 puffs into the lungs every 6 (six) hours as needed for wheezing or shortness of breath. Patient not taking: Reported on 04/05/2021 12/04/20   McLean-Scocuzza, Nino Glow, MD  amLODipine (NORVASC) 10 MG tablet Take 1 tablet (10 mg total) by mouth daily. 01/11/21   Minna Merritts, MD  atorvastatin (LIPITOR) 20 MG tablet Take 1 tablet (20 mg total) by mouth daily. 01/11/21   Minna Merritts, MD  carvedilol (COREG) 12.5 MG tablet Take 1 tablet (12.5 mg total) by mouth 2 (two) times daily. Patient not taking: Reported on 04/05/2021 12/09/20   Marrianne Mood D, PA-C  cloNIDine (CATAPRES) 0.2 MG tablet Take 1 tablet (0.2 mg total) by mouth 2 (two) times daily. 01/11/21   Minna Merritts, MD  ENVARSUS XR 1 MG TB24 Take 1 tablet by mouth daily as needed. 01/18/21   [provider]  esomeprazole (NEXIUM) 20 MG capsule Take 20 mg by  mouth daily. 04/16/20   [provider]  furosemide (LASIX) 20 MG tablet Take 1 tablet (20 mg total) by mouth daily as needed (shortness of breath). Patient not taking: Reported on 04/05/2021 04/27/20   Minna Merritts, MD  hydrALAZINE (APRESOLINE) 50 MG tablet Take 1 tablet (50 mg total) by mouth 3 (three) times daily as needed. For BP greater then 140 Patient not taking: Reported on 04/05/2021 01/11/21   Minna Merritts, MD  loratadine (CLARITIN) 10 MG tablet Take 10 mg by mouth daily.    [provider]  magnesium oxide (MAG-OX) 400 MG tablet Take 400 mg by mouth 2 (two) times daily.    [provider]  nitroGLYCERIN (NITROSTAT) 0.4 MG SL tablet Place 1 tablet (0.4 mg total) under the tongue every 5 (five) minutes as needed for chest pain. Patient not taking: Reported on 04/05/2021 04/27/20   Minna Merritts, MD  oxyCODONE-acetaminophen (PERCOCET/ROXICET) 5-325 MG tablet Take 1 tablet by mouth 2 (two) times daily as needed for severe pain. Patient not taking: No sig reported 12/04/20   McLean-Scocuzza, Nino Glow, MD  pregabalin (LYRICA) 75 MG capsule Take 1 capsule (75 mg total) by mouth 2 (two) times daily. 12/04/20   McLean-Scocuzza, Nino Glow, MD  sodium bicarbonate 650 MG tablet Take 650 mg by mouth 2 (two) times daily.    [provider]  Tacrolimus ER 4 MG TB24 Take 8 mg by mouth every morning.    [provider]  Vitamin D, Ergocalciferol, (DRISDOL) 1.25  MG (50000 UNIT) CAPS capsule Take 50,000 Units by mouth every 7 (seven) days. Mondays    [provider]      Labs on Admission: I have personally reviewed following labs and imaging studies  CBC: Recent Labs  Lab 05/17/21 2214  WBC 6.1  HGB 9.6*  HCT 28.3*  MCV 93.7  PLT 325   Basic Metabolic Panel: Recent Labs  Lab 05/17/21 2214  NA 129*  K 4.6  CL 102  CO2 22  GLUCOSE 339*  BUN 22  CREATININE 1.62*  CALCIUM 9.3   GFR: Estimated Creatinine Clearance: 35.2  mL/min (A) (by C-G formula based on SCr of 1.62 mg/dL (H)). Liver Function Tests: No results for input(s): AST, ALT, ALKPHOS, BILITOT, PROT, ALBUMIN in the last 168 hours. No results for input(s): LIPASE, AMYLASE in the last 168 hours. No results for input(s): AMMONIA in the last 168 hours. Coagulation Profile: No results for input(s): INR, PROTIME in the last 168 hours. Cardiac Enzymes: No results for input(s): CKTOTAL, CKMB, CKMBINDEX, TROPONINI in the last 168 hours. BNP (last 3 results) No results for input(s): PROBNP in the last 8760 hours. HbA1C: No results for input(s): HGBA1C in the last 72 hours. CBG: Recent Labs  Lab 05/17/21 2213  GLUCAP 352*   Lipid Profile: No results for input(s): CHOL, HDL, LDLCALC, TRIG, CHOLHDL, LDLDIRECT in the last 72 hours. Thyroid Function Tests: No results for input(s): TSH, T4TOTAL, FREET4, T3FREE, THYROIDAB in the last 72 hours. Anemia Panel: No results for input(s): VITAMINB12, FOLATE, FERRITIN, TIBC, IRON, RETICCTPCT in the last 72 hours. Urine analysis:    Component Value Date/Time   COLORURINE YELLOW (A) 04/19/2020 1640   APPEARANCEUR CLEAR (A) 04/19/2020 1640   APPEARANCEUR Hazy 06/26/2012 2030   LABSPEC 1.011 04/19/2020 1640   LABSPEC 1.010 06/26/2012 2030   PHURINE 7.0 04/19/2020 1640   GLUCOSEU NEGATIVE 04/19/2020 1640   GLUCOSEU 50 mg/dL 06/26/2012 2030   HGBUR NEGATIVE 04/19/2020 1640   BILIRUBINUR NEGATIVE 04/19/2020 1640   BILIRUBINUR Negative 06/26/2012 2030   KETONESUR NEGATIVE 04/19/2020 1640   PROTEINUR NEGATIVE 04/19/2020 1640   NITRITE NEGATIVE 04/19/2020 1640   LEUKOCYTESUR NEGATIVE 04/19/2020 1640   LEUKOCYTESUR Negative 06/26/2012 2030    Radiological Exams on Admission: DG Abd Acute W/Chest  Result Date: 05/17/2021 CLINICAL DATA:  Constipation, panic attack EXAM: DG ABDOMEN ACUTE WITH 1 VIEW CHEST COMPARISON:  12/20/2020 FINDINGS: Supine and upright frontal views of the abdomen and pelvis as well as an  upright frontal view of the chest are obtained. Cardiac silhouette is unremarkable. No airspace disease, effusion, or pneumothorax. Vascular stent overlies left upper chest. Bowel gas pattern is unremarkable without obstruction or ileus. Moderate retained stool throughout the colon. No masses or abnormal calcifications. No free gas in the greater peritoneal sac. Bony structures are unremarkable. IMPRESSION: 1. Moderate fecal retention. 2. No bowel obstruction or ileus. 3. No acute intrathoracic process. Electronically Signed   By: Randa Ngo M.D.   On: 05/17/2021 23:59       Athena Masse MD [facility]   05/18/2021, 3:45 AM

## 2021-05-18 NOTE — Consult Note (Signed)
Cardiology Consultation:   Patient ID: Theresa [name] MRN: [id number]; DOB: February 03, 1956  Admit date: 05/18/2021 Date of Consult: 05/18/2021  PCP:  McLean-Scocuzza, Nino Glow, MD   [facility] Providers Cardiologist:  Ida Rogue, MD   \     Patient Profile:   Theresa [name] is a 65 y.o. female with a hx of CAD s/p 2014 RCA stenting with repeat LHC 2016 for positive stress test recommending medical management, moderate MR, HFpEF, ESRD presumed secondary to diabetic neuropathy previously on HD s/p deceased donor renal transplant on 03/2019, COPD, PVD, bilateral carotid artery disease, DM2 with diabetic neuropathy and retinopathy, sickle cell trait, HTN, HLD, esophageal stenosis with history of esophageal ulcers s/p dilation, remote tobacco use (quit 2013) who is being seen 05/18/2021 for the evaluation of chest pain at the request of Dr. Francine Graven.  History of Present Illness:   Theresa [name] is followed by Dr. Rockey Situ for the above cardiac issues. In 05/2013 she siuffered a NSTEMI and underwent DES placement to the RCA. LHC 04/2015 after an abnormal stress test with patent RCA stent and no obstructive disease. Myoview Lexiscan 2019 was normal with no evidence for significant ischemia or scar. EF 64%. 07/2017 echo at [facility] showed stable, moderate MR and aortic sclerosis with normal LV function.   Seen 04/2020 for hypertensive urgency and flash pulmonary edema with MR at [facility]. She had stopped several BP medications. She was hypertensive throughout her admission with restart of clonidine. Echo showed moderate MR. Medical management recommended and though likely dynamic in the setting of CHF with recommendation for volume control.   Last seen 01/11/21 and BP was elevated. No symptoms. An extra clonidine was added in the middle of the day. Also given hydralazine 50mg  TID.   The patient presented to [facility] ED for hyperglycemia and panic attack. It occurred last night. She was laying in her bed and suddenly felt  like she couldn't breath. She fel there heart racing. She has had panic attacks in the past, but not this severe. She also had chest pain, it was a pressure. Also had nausea and vomiting. No fever chills. No LLE, orthopnea, pnd. Does not regularly have exertional chest pain. Reports compliance with medications. Does not have meds for panic attacks. Panic attack lasted about 20 minutes. She called EMS because she had an episodes where she felt like she could not breath. EMS found BG 426.   In the ER BP was 185/62, pulse 70, RR 18, afebrile, 96% O2. Labs showed sodium 129, glucose 330, Scr 1.62, Hgb 9.6. HS trop 20>27. EKG showed NSR 68bpm, LVH with repol abnormalities, TWI aVL, nonspecific ST changes. Patient was given ASA and clonidine and admitted.   Lives with her son, able to care for herself. No drug or alcohol history. Used to smoke. Smokes weed occasionally. She says she was taking clonidine and amlodipine. Was not taking hydralazine, coreg or lasix.    Past Medical History:  Diagnosis Date   Anemia of chronic disease    Anginal pain ([place])    Carotid arterial disease ([place])    a. 02/2013 U/S: 40-59% bilat ICA stenosis.   Chronic constipation    Chronic diastolic CHF (congestive heart failure) ([place])    a. 10/2013 Echo [facility]): EF 55-60%, mod conc LVH, mod MR, mildly dil LA, mild Ao sclerosis w/o stenosis; b. 02/2016 Echo: EF 55-60%, no rwma, Gr DD, mild AS, mod to sev MR, mildly dil LA, PASP 82mmHg; c. 07/2017 Echo [facility]): EF >55%, mild  to mod LVH, Gr2 DD, Ao scl, Mod MR, mod dil LA, nl RV fxn.   Colon polyps    COPD (chronic obstructive pulmonary disease) (HCC)    Coronary artery disease    a. 05/2013 NSTEMI/PCI: LM 20d, LAD min irregs, LCX small, nl, OM1 nl, RCA dom 81m (2.5x16 Promus DES), PDA1 80p; b. 04/2015 Cath: LM nl, LAD 82m, D1/2 min irregs, LCX 35p/m, OM2/3 min irregs, RCA patent mid stent, RPDA 70ost, RPLB1 30, RPLB2/3 min irregs, EF 55-65%--> Med Rx; c. 06/2017 [facility]): No  ischemia/infarct, EF 64%.   Diabetes mellitus    Diabetic neuropathy ([place])    Diabetic retinopathy ([place]) 05/28/2013   Hx bilat retinal detachment, proliferative diab retinopathy and bilat vitreous hemorrhage    Emphysema    ESRD on hemodialysis ([place])    a. [facility] in [place], [place] [phone]/Dr. [name], on a MWF schedule.  She started dialysis in Feb 2014.  Etiology of renal failure not known, likely diabetes.  Has a left upper arm AV graft.   History of bronchitis    Mar 2012   History of pneumonia    June 2012   History of tobacco abuse    a. Quit 2012.   Hyperlipidemia    Hypertension    Moderate to severe mitral insufficiency    a. 10/2013 Echo: EF 55-60%, mod MR; b. 02/2016 Echo: EF 55-60%, mod to sev MR directed posteriorly--felt to be dynamic -worse with volume overload; c. 07/2017 Echo [facility]): EF >55%. Mod MR.   Myocardial infarct [facility]) 05/2013   Peripheral vascular disease ([place])    Sickle cell trait ([place])    Thyroid nodule    [place] 06/2017 due to f/u US in 1 year     Past Surgical History:  Procedure Laterality Date   A/V FISTULAGRAM Left 11/10/2017   Procedure: A/V FISTULAGRAM;  Surgeon: Katha Cabal, MD;  Location: [place] CV LAB;  Service: Cardiovascular;  Laterality: Left;   A/V SHUNTOGRAM Left 02/08/2019   Procedure: A/V SHUNTOGRAM;  Surgeon: Katha Cabal, MD;  Location: [place] CV LAB;  Service: Cardiovascular;  Laterality: Left;   ABDOMINAL HYSTERECTOMY     2000 for cysts on ovaries total no abnormal pap   CARDIAC CATHETERIZATION     CARDIAC CATHETERIZATION N/A 05/02/2015   Procedure: Left Heart Cath and Coronary Angiography;  Surgeon: [name], MD;  Location: [place] CV LAB;  Service: Cardiovascular;  Laterality: N/A;   COLONOSCOPY WITH PROPOFOL N/A 07/08/2016   Procedure: COLONOSCOPY WITH PROPOFOL;  Surgeon: [name], MD;  Location: [facility] ENDOSCOPY;  Service: Endoscopy;  Laterality: N/A;   COLONOSCOPY WITH PROPOFOL N/A  08/20/2017   Procedure: COLONOSCOPY WITH PROPOFOL;  Surgeon: [name], MD;  Location: [facility] ENDOSCOPY;  Service: Gastroenterology;  Laterality: N/A;   colonscopy     CORONARY ANGIOPLASTY  05/28/2014   stent placement to the mid RCA   CYSTOURETHROSCOPY     [facility] urology with stent placement 11 or 05/2019 and stent removal 06/13/19    DILATION AND CURETTAGE OF UTERUS     several in the early 80's   ESOPHAGOGASTRODUODENOSCOPY     2012   ESOPHAGOGASTRODUODENOSCOPY (EGD) WITH PROPOFOL N/A 03/11/2018   Procedure: ESOPHAGOGASTRODUODENOSCOPY (EGD) WITH PROPOFOL;  Surgeon: [name], MD;  Location: [facility] ENDOSCOPY;  Service: Gastroenterology;  Laterality: N/A;   EYE SURGERY     bilateral laser 2012   EYE SURGERY     right   EYE SURGERY     x4  both eyes   GAS INSERTION  09/30/2011   Procedure: INSERTION OF GAS;  Surgeon: [name], MD;  Location: [place];  Service: Ophthalmology;  Laterality: Right;  [id number]   GAS/FLUID EXCHANGE  09/30/2011   Procedure: GAS/FLUID EXCHANGE;  Surgeon: [name], MD;  Location: [place];  Service: Ophthalmology;  Laterality: Right;   KIDNEY TRANSPLANT     03/30/19 Dr. Mickel Baas Thomas/Dr. Cristie Hem Zendel/Dr. Gilford Raid; [facility] transplant Dr. Horald Chestnut will be primary [phone] coordinator Sarah Wynkoop [phone]   KIDNEY TRANSPLANT     03/30/19 [facility]   LEFT HEART CATHETERIZATION WITH CORONARY ANGIOGRAM N/A 05/28/2013   Procedure: LEFT HEART CATHETERIZATION WITH CORONARY ANGIOGRAM;  Surgeon: [name], MD;  Location: [facility] CATH LAB;  Service: Cardiovascular;  Laterality: N/A;   PARS PLANA VITRECTOMY  04/22/2011   Procedure: PARS PLANA VITRECTOMY WITH 25 GAUGE;  Surgeon: [name], MD;  Location: [place];  Service: Ophthalmology;  Laterality: Left;  membrane peel, endolaser, gas fluid exchange, silicone oil, repair of complex traction retinal detachment   PARS PLANA VITRECTOMY  09/30/2011   Procedure: PARS PLANA VITRECTOMY WITH 25 GAUGE;  Surgeon: [name], MD;  Location: [place];  Service: Ophthalmology;  Laterality: Right;  Endolaser; Repair of Complex Traction Retinal Detachment   PARS PLANA VITRECTOMY  02/24/2012   Procedure: PARS PLANA VITRECTOMY WITH 25 GAUGE;  Surgeon: [name], MD;  Location: [place];  Service: Ophthalmology;  Laterality: Left;   PTCA     SILICON OIL REMOVAL  42/70/[id number]   Procedure: SILICON OIL REMOVAL;  Surgeon: [name], MD;  Location: [place];  Service: Ophthalmology;  Laterality: Left;   THROMBECTOMY / ARTERIOVENOUS GRAFT REVISION     TUBAL LIGATION     1979   UPPER EXTREMITY ANGIOGRAPHY Left 02/08/2019   Procedure: UPPER EXTREMITY ANGIOGRAPHY;  Surgeon: Katha Cabal, MD;  Location: [place] CV LAB;  Service: Cardiovascular;  Laterality: Left;     Home Medications:  Prior to Admission medications   Medication Sig Start Date End Date Taking? Authorizing Provider  acetaminophen (TYLENOL) 500 MG tablet Take 1,000 mg by mouth daily as needed. 03/31/19  Yes [provider]  albuterol (PROVENTIL) (2.5 MG/3ML) 0.083% nebulizer solution Take 3 mLs (2.5 mg total) by nebulization every 6 (six) hours as needed for wheezing or shortness of breath. 12/04/20  Yes McLean-Scocuzza, Nino Glow, MD  albuterol (VENTOLIN HFA) 108 (90 Base) MCG/ACT inhaler Inhale 1-2 puffs into the lungs every 6 (six) hours as needed for wheezing or shortness of breath. 12/04/20  Yes McLean-Scocuzza, Nino Glow, MD  amLODipine (NORVASC) 10 MG tablet Take 1 tablet (10 mg total) by mouth daily. 01/11/21  Yes Minna Merritts, MD  aspirin EC 81 MG tablet Take 81 mg by mouth daily. Swallow whole.   Yes [provider]  atorvastatin (LIPITOR) 20 MG tablet Take 1 tablet (20 mg total) by mouth daily. 01/11/21  Yes Minna Merritts, MD  cloNIDine (CATAPRES) 0.2 MG tablet Take 1 tablet (0.2 mg total) by mouth 2 (two) times daily. 01/11/21  Yes Minna Merritts, MD  docusate sodium (COLACE) 100 MG capsule Take 100 mg by mouth 2  (two) times daily as needed for mild constipation.   Yes [provider]  ENVARSUS XR 1 MG TB24 Take 1 mg by mouth daily as needed. Total dose is 9 mg every morning at 0900. 01/18/21  Yes [provider]  esomeprazole (  NEXIUM) 20 MG capsule Take 20 mg by mouth daily. 04/16/20  Yes [provider]  furosemide (LASIX) 20 MG tablet Take 1 tablet (20 mg total) by mouth daily as needed (shortness of breath). 04/27/20  Yes Minna Merritts, MD  hydrALAZINE (APRESOLINE) 50 MG tablet Take 1 tablet (50 mg total) by mouth 3 (three) times daily as needed. For BP greater then 140 01/11/21  Yes Gollan, Kathlene November, MD  magnesium oxide (MAG-OX) 400 MG tablet Take 400 mg by mouth 2 (two) times daily.   Yes [provider]  nitroGLYCERIN (NITROSTAT) 0.4 MG SL tablet Place 1 tablet (0.4 mg total) under the tongue every 5 (five) minutes as needed for chest pain. 04/27/20  Yes Minna Merritts, MD  pregabalin (LYRICA) 75 MG capsule Take 1 capsule (75 mg total) by mouth 2 (two) times daily. 12/04/20  Yes McLean-Scocuzza, Nino Glow, MD  sodium bicarbonate 650 MG tablet Take 650 mg by mouth 2 (two) times daily.   Yes [provider]  tacrolimus ER (ENVARSUS XR) 4 MG TB24 Take 8 mg by mouth daily before breakfast. Total dose is 9 mg daily in the morning.   Yes [provider]  Vitamin D, Ergocalciferol, (DRISDOL) 1.25 MG (50000 UNIT) CAPS capsule Take 50,000 Units by mouth every 7 (seven) days. Mondays   Yes [provider]  carvedilol (COREG) 12.5 MG tablet Take 1 tablet (12.5 mg total) by mouth 2 (two) times daily. Patient not taking: Reported on 04/05/2021 12/09/20   Marrianne Mood D, PA-C  loratadine (CLARITIN) 10 MG tablet Take 10 mg by mouth daily.    [provider]  oxyCODONE-acetaminophen (PERCOCET/ROXICET) 5-325 MG tablet Take 1 tablet by mouth 2 (two) times daily as needed for severe pain. Patient not taking: Reported on 01/11/2021 12/04/20    McLean-Scocuzza, Nino Glow, MD    Inpatient Medications: Scheduled Meds:  [START ON 05/19/2021] aspirin EC  81 mg Oral Daily   enoxaparin (LOVENOX) injection  40 mg Subcutaneous Q24H   hydrALAZINE  25 mg Oral Once   insulin aspart  0-9 Units Subcutaneous Q4H   Continuous Infusions:  PRN Meds: acetaminophen, nitroGLYCERIN, ondansetron (ZOFRAN) IV  Allergies:    Allergies  Allergen Reactions   Hydrocodone Hives   Lisinopril     Unknown reaction     Social History:   Social History   Socioeconomic History   Marital status: Single    Spouse name: Not on file   Number of children: 2   Years of education: Not on file   Highest education level: Not on file  Occupational History   Not on file  Tobacco Use   Smoking status: Former    Packs/day: 1.00    Years: 25.00    Pack years: 25.00    Types: Cigarettes    Quit date: 06/09/2010    Years since quitting: 10.9   Smokeless tobacco: Never  Vaping Use   Vaping Use: Never used  Substance and Sexual Activity   Alcohol use: No   Drug use: No   Sexual activity: Never  Other Topics Concern   Not on file  Social History Narrative   On disability.    Lives with son Brenton Grills   2 children (has son and daughter)      Son drives her since her vision has decreased   Social Determinants of Health   Financial Resource Strain: Low Risk    Difficulty of Paying Living Expenses: Not hard at all  Food  Insecurity: No Food Insecurity   Worried About Charity fundraiser in the Last Year: Never true   Ran Out of Food in the Last Year: Never true  Transportation Needs: No Transportation Needs   Lack of Transportation (Medical): No   Lack of Transportation (Non-Medical): No  Physical Activity: Not on file  Stress: No Stress Concern Present   Feeling of Stress : Not at all  Social Connections: Unknown   Frequency of Communication with Friends and Family: More than three times a week   Frequency of Social Gatherings with Friends and  Family: Not on file   Attends Religious Services: Not on Electrical engineer or Organizations: Not on file   Attends Archivist Meetings: Not on file   Marital Status: Not on file  Intimate Partner Violence: Not At Risk   Fear of Current or Ex-Partner: No   Emotionally Abused: No   Physically Abused: No   Sexually Abused: No    Family History:    Family History  Problem Relation Age of Onset   Stroke Mother    Heart attack Mother    Heart disease Mother    Colon cancer Father    Colon cancer Sister    Breast cancer Sister    Heart attack Brother    Stroke Brother    Diabetes Brother    Diabetes Brother    Diabetes Daughter    Renal Disease Daughter        on HD   Kidney failure Daughter        age 65 as of 12/03/20   Anesthesia problems Neg Hx    Hypotension Neg Hx    Malignant hyperthermia Neg Hx    Pseudochol deficiency Neg Hx      ROS:  Please see the history of present illness.   All other ROS reviewed and negative.     Physical Exam/Data:   Vitals:   05/18/21 0224 05/18/21 0330 05/18/21 0538 05/18/21 0600  BP: 135/62 (!) 179/54 (!) 199/46 (!) 96/53  Pulse: 66 65 71 65  Resp: 20 20  19   Temp: 98.5 F (36.9 C)  97.6 F (36.4 C)   TempSrc: Oral  Oral   SpO2: 100% 100% 100% 100%  Weight:      Height:       No intake or output data in the 24 hours ending 05/18/21 0722 Last 3 Weights 05/17/2021 04/05/2021 01/11/2021  Weight (lbs) 157 lb 153 lb 158 lb  Weight (kg) 71.215 kg 69.4 kg 71.668 kg     Body mass index is 24.23 kg/m.  General:  Well nourished, well developed, in no acute distress HEENT: normal Neck: no JVD Vascular: No carotid bruits; Distal pulses 2+ bilaterally Cardiac:  normal S1, S2; RRR; no murmur  Lungs:  clear to auscultation bilaterally, no wheezing, rhonchi or rales  Abd: soft, nontender, no hepatomegaly  Ext: no edema Musculoskeletal:  No deformities, BUE and BLE strength normal and equal Skin: warm and dry   Neuro:  CNs 2-12 intact, no focal abnormalities noted Psych:  Normal affect   EKG:  The EKG was personally reviewed and demonstrates:  EKG showed NSR 68bpm, LVH with repol abnormalities, TWI aVL, nonspecific ST changes Telemetry:  Telemetry was personally reviewed and demonstrates:  NSR HR 70s, PVC/PACs  Relevant CV Studies:  Echo 04/2020 1. Left ventricular ejection fraction, by estimation, is 60 to 65%. The  left ventricle has normal function. The left ventricle  has no regional  wall motion abnormalities. There is mild left ventricular hypertrophy.  Left ventricular diastolic parameters  are consistent with Grade II diastolic dysfunction (pseudonormalization).   2. Right ventricular systolic function is normal. The right ventricular  size is normal.   3. Left atrial size was moderately dilated.   4. Moderate mitral valve regurgitation.   5. Aortic valve regurgitation is mild. Mild aortic valve stenosis.   LHC 2016 Conclusion  The left ventricular systolic function is normal. Mid LAD-1 lesion, 30% stenosed. Mid LAD-2 lesion, 20% stenosed. Prox Cx to Mid Cx lesion, 35% stenosed. 1st RPLB lesion, 30% stenosed. Ost RPDA lesion, 70% stenosed.   1. Significant one-vessel coronary artery disease with patent stent in the midright coronary artery. 70% ostial right PDA stenosis which is unchanged from most recent cardiac catheterization in 2014. A faint collateral is noted from right to left supplying what seems to be a small septal branch of the LAD. The left system has mild nonobstructive disease. 2. Normal LV systolic function. 3. Severely elevated blood pressure with moderately elevated left ventricular end-diastolic pressure.   Recommendations: The patient's dyspnea is likely due to chronic diastolic heart failure with uncontrolled hypertension. Recommend blood pressure control. The patient was given 20 mg of IV labetalol and 20 mg of IV hydralazine during the case. I increased the  dose of carvedilol to 25 mg twice a day and added amlodipine 5 mg once daily. The patient might require dialysis before hospital discharge. She is at low risk for kidney transplant from a cardiac standpoint.   Myoview Lexiscan 06/2017 Impressions:  - Normal myocardial perfusion study  - No evidence for significant ischemia or scar is noted.  - Post stress: Global systolic function is normal. The ejection fraction  calculated at 64%.  - Coronary and aortic calcifications are noted  - The right breast soft tissue mass is similar in size to previous study  03/18/2016  - The subcentimeter nodule is stable from previous study 03/18/16 and  03/07/14.     Laboratory Data:  High Sensitivity Troponin:   Recent Labs  Lab 05/17/21 2214 05/18/21 0225  TROPONINIHS 20* 27*     Chemistry Recent Labs  Lab 05/17/21 2214  NA 129*  K 4.6  CL 102  CO2 22  GLUCOSE 339*  BUN 22  CREATININE 1.62*  CALCIUM 9.3  GFRNONAA 35*  ANIONGAP 5    No results for input(s): PROT, ALBUMIN, AST, ALT, ALKPHOS, BILITOT in the last 168 hours. Lipids No results for input(s): CHOL, TRIG, HDL, LABVLDL, LDLCALC, CHOLHDL in the last 168 hours.  Hematology Recent Labs  Lab 05/17/21 2214  WBC 6.1  RBC 3.02*  HGB 9.6*  HCT 28.3*  MCV 93.7  MCH 31.8  MCHC 33.9  RDW 14.4  PLT 158   Thyroid No results for input(s): TSH, FREET4 in the last 168 hours.  BNPNo results for input(s): BNP, PROBNP in the last 168 hours.  DDimer No results for input(s): DDIMER in the last 168 hours.   Radiology/Studies:  DG Abd Acute W/Chest  Result Date: 05/17/2021 CLINICAL DATA:  Constipation, panic attack EXAM: DG ABDOMEN ACUTE WITH 1 VIEW CHEST COMPARISON:  12/20/2020 FINDINGS: Supine and upright frontal views of the abdomen and pelvis as well as an upright frontal view of the chest are obtained. Cardiac silhouette is unremarkable. No airspace disease, effusion, or pneumothorax. Vascular stent overlies left upper chest.  Bowel gas pattern is unremarkable without obstruction or ileus. Moderate retained stool throughout the  colon. No masses or abnormal calcifications. No free gas in the greater peritoneal sac. Bony structures are unremarkable. IMPRESSION: 1. Moderate fecal retention. 2. No bowel obstruction or ileus. 3. No acute intrathoracic process. Electronically Signed   By: Randa Ngo M.D.   On: 05/17/2021 23:59     Assessment and Plan:   Chest pain Panic attack - presented with panic attack that was more severe than prior attacks. She had SOB, chest pain, N, vomiting that resolved as panic attack resolved. - HS trop 20>27 - Labs showed elevated BG s/p insulin - kidney function stable - BP elevated with SBP 190s - abd/chest x-ray with moderate fecal retention, no acute intrathoracic process.  - Hgb stable at 8-9 - she was given ASA 325mg  once and clonidine 0.2mg  once - BP improved, most recent 96/63. She reports compliance with clonidine and amlodipine. Restart PTA coreg as able. She may need hydralazine.  - She may need something PRN for panic attacks  Hypertensive urgency - SBP up to 190s - s/p clonidine with improvement - reports compliance with clonidine and amlodipine - restart coreg  as able - may need hydralazine - continue to monitor BP  CAD s/p prior stenting - chest pain in the setting of elevated BP and panic attack - HS trop mildly elevated, continue to trend - EKG with no significant changes - continue ASA - restart statin, reports she has not been taking statin - restart Coreg as able - She had a LHC in 2016 and Myoview in 2019 that was normal - echo ordered - can do Myoview lexiscan  Hyperglycemia DM2 - per IM  COPD - stable  CKD stage 3 H/o renal transplant - Scr stable  HFpEF - appears euvolemic on exam - does not take lasix daily - restart Coreg as able - repeat echo ordered  For questions or updates, please contact [place] Please consult  [email or web address] for contact info under    Signed, Gunda Maqueda Ninfa Meeker, PA-C  05/18/2021 7:22 AM

## 2021-05-18 NOTE — ED Notes (Addendum)
Secure msg sent to Dr. Kurtis Bushman re: pts concerns about missing her meds, especially her anti-rejection meds for her kidney transplant.

## 2021-05-18 NOTE — ED Provider Notes (Signed)
[facility] Emergency Department Provider Note  ____________________________________________   Event Date/Time   First MD Initiated Contact with Patient 05/18/21 0249     (approximate)  I have reviewed the triage vital signs and the nursing notes.   HISTORY  Chief Complaint Hyperglycemia and Panic Attack    HPI Theresa [name] is a 65 y.o. female with history of CHF, CAD status post stent, COPD, hypertension, chronic kidney disease status post renal transplant in 2020 at [facility] who presents to the emergency department with multiple complaints.  States she called EMS tonight because she had an episode where she felt like she could not breathe that it started around 7 or 8 PM while lying in bed.  States symptoms lasted until she got to the emergency department.  She did have central chest pain that she describes as a "deep" throbbing and pressure.  No radiation of pain.  She felt short of breath, nauseated and dizzy but no diaphoresis.  She states she had similar symptoms when she had a previous heart attack in 2014 but did not have pain at that time.  She states she has 1 stent but is no longer on antiplatelets or anticoagulants.  She denies any aggravating or alleviating factors.  No chest pain currently.   Patient states that she was also concerned when EMS came because they checked her blood sugar and it was 426.  She denies a history of diabetes however she has diabetes listed as a previous diagnosis in her past medical history.  States she is not on anything for her blood sugar.  States EMS also told her her systolic blood pressure was in the 200s which worried her.  Blood pressures currently 185/61.  She reports compliance with all of her blood pressure medications.   She states that she is also concerned because she has been constipated.  She denies any abdominal pain.  No current nausea and has not had any vomiting.  States she has been able to pass very small  bowel movements and gas but has been drinking prune juice without relief.  She has had a complete hysterectomy and a BTL.  Has not tried any over-the-counter medications for her constipation.      Past Medical History:  Diagnosis Date   Anemia of chronic disease    Anginal pain ([place])    Carotid arterial disease ([place])    a. 02/2013 U/S: 40-59% bilat ICA stenosis.   Chronic constipation    Chronic diastolic CHF (congestive heart failure) ([place])    a. 10/2013 Echo [facility]): EF 55-60%, mod conc LVH, mod MR, mildly dil LA, mild Ao sclerosis w/o stenosis; b. 02/2016 Echo: EF 55-60%, no rwma, Gr DD, mild AS, mod to sev MR, mildly dil LA, PASP 82mmHg; c. 07/2017 Echo [facility]): EF >55%, mild to mod LVH, Gr2 DD, Ao scl, Mod MR, mod dil LA, nl RV fxn.   Colon polyps    COPD (chronic obstructive pulmonary disease) (HCC)    Coronary artery disease    a. 05/2013 NSTEMI/PCI: LM 20d, LAD min irregs, LCX small, nl, OM1 nl, RCA dom 10m (2.5x16 Promus DES), PDA1 80p; b. 04/2015 Cath: LM nl, LAD 44m, D1/2 min irregs, LCX 35p/m, OM2/3 min irregs, RCA patent mid stent, RPDA 70ost, RPLB1 30, RPLB2/3 min irregs, EF 55-65%--> Med Rx; c. 06/2017 [facility]): No ischemia/infarct, EF 64%.   Diabetes mellitus    Diabetic neuropathy ([place])    Diabetic retinopathy ([place]) 05/28/2013   Hx  bilat retinal detachment, proliferative diab retinopathy and bilat vitreous hemorrhage    Emphysema    ESRD on hemodialysis ([place])    a. [facility] in [place], [place] ([phone]/Dr. [name], on a MWF schedule.  She started dialysis in Feb 2014.  Etiology of renal failure not known, likely diabetes.  Has a left upper arm AV graft.   History of bronchitis    Mar 2012   History of pneumonia    June 2012   History of tobacco abuse    a. Quit 2012.   Hyperlipidemia    Hypertension    Moderate to severe mitral insufficiency    a. 10/2013 Echo: EF 55-60%, mod MR; b. 02/2016 Echo: EF 55-60%, mod to sev MR directed posteriorly--felt to be dynamic -worse with  volume overload; c. 07/2017 Echo [facility]): EF >55%. Mod MR.   Myocardial infarct [facility]) 05/2013   Peripheral vascular disease ([place])    Sickle cell trait ([place])    Thyroid nodule    [place] 06/2017 due to f/u US in 1 year     Patient Active Problem List   Diagnosis Date Noted   Hyperglycemia due to type 2 diabetes mellitus ([place]) 05/18/2021   Chest pain 05/18/2021   Breast calcification seen on mammogram 04/08/2021   Lung nodules 12/04/2020   Thrombocytopenia ([place]) 12/04/2020   Hyperkalemia 04/18/2020   Hypertensive urgency 04/18/2020   Chronic pain syndrome 02/27/2020   Facet arthritis of cervical region 02/27/2020   Lumbar radiculopathy 02/27/2020   Hypercalcemia 02/27/2020   Aortic atherosclerosis ([place]) 02/24/2020   Myelopathy ([place]) 01/23/2020   Bilateral leg weakness 01/06/2020   Bilateral leg and foot pain 01/06/2020   Numbness and tingling of both legs 01/06/2020   Ulcer of esophagus without bleeding 06/20/2019   Physical deconditioning 06/20/2019   Vitamin D deficiency 06/20/2019   Kidney transplanted 03/30/2019   Immunocompromised patient ([place]) 03/30/2019   Chronic illness 03/14/2019   Dementia ([place]) 02/28/2019   Abnormal MRI, lumbar spine 02/28/2019   Pain due to onychomycosis of toenails of both feet 11/18/2018   Tremor 09/23/2018   Allergic rhinitis 09/23/2018   Memory loss 05/25/2018   Valvular heart disease 01/05/2018   History of heart attack 01/05/2018   Chronic back pain 01/05/2018   Elevated alkaline phosphatase level 12/29/2017   Thyroid nodule 08/11/2017   CHF (congestive heart failure) ([place]) 07/26/2017   PAD (peripheral artery disease) ([place]) 03/07/2017   Bilateral carotid artery stenosis 17/40/8144   Complication of vascular access for dialysis 06/19/2016   Atypical chest pain 05/21/2016   Mitral regurgitation 02/06/2016   OSA on CPAP 12/20/2015   Screening for breast cancer 07/27/2015   Dysuria 07/27/2015   Chronic diastolic CHF (congestive heart failure)  ([place]) 05/07/2015   S/P coronary artery stent placement    Leg pain 03/05/2015   Neuritis or radiculitis due to rupture of lumbar intervertebral disc 11/28/2014   Routine general medical examination at a health care facility 09/02/2013   Osteoarthritis of right hip 08/23/2013   DDD (degenerative disc disease), lumbosacral 08/23/2013   Right hip pain 08/08/2013   Coronary artery disease of native artery of native heart with stable angina pectoris ([place]) 06/27/2013   Diabetic retinopathy ([place]) 05/28/2013   Hypertension    Hyperlipidemia    Syncope 01/21/2013   End stage renal disease ([place]) 11/08/2012   Essential hypertension 11/08/2012   Diabetic neuropathy ([place]) 02/18/2012   Anemia in chronic kidney disease (CKD) 02/18/2012   ESRD on hemodialysis ([place]) 08/21/2011   Diabetes  mellitus, type II ([place]) 06/30/2011   Stage 3b chronic kidney disease ([place]) 06/30/2011   COPD (chronic obstructive pulmonary disease) ([place]) 06/13/2011   Heart murmur 06/13/2011    Past Surgical History:  Procedure Laterality Date   A/V FISTULAGRAM Left 11/10/2017   Procedure: A/V FISTULAGRAM;  Surgeon: Katha Cabal, MD;  Location: [place] [facility];  Service: Cardiovascular;  Laterality: Left;   A/V SHUNTOGRAM Left 02/08/2019   Procedure: A/V SHUNTOGRAM;  Surgeon: Katha Cabal, MD;  Location: [place] [facility];  Service: Cardiovascular;  Laterality: Left;   ABDOMINAL HYSTERECTOMY     2000 for cysts on ovaries total no abnormal pap   CARDIAC CATHETERIZATION     CARDIAC CATHETERIZATION N/A 05/02/2015   Procedure: Left Heart Cath and Coronary Angiography;  Surgeon: [name], MD;  Location: [place] [facility];  Service: Cardiovascular;  Laterality: N/A;   COLONOSCOPY WITH PROPOFOL N/A 07/08/2016   Procedure: COLONOSCOPY WITH PROPOFOL;  Surgeon: [name], MD;  Location: [facility] ENDOSCOPY;  Service: Endoscopy;  Laterality: N/A;   COLONOSCOPY WITH PROPOFOL N/A 08/20/2017   Procedure:  COLONOSCOPY WITH PROPOFOL;  Surgeon: [name], MD;  Location: [facility] ENDOSCOPY;  Service: Gastroenterology;  Laterality: N/A;   colonscopy     CORONARY ANGIOPLASTY  05/28/2014   stent placement to the mid RCA   CYSTOURETHROSCOPY     [facility] urology with stent placement 11 or 05/2019 and stent removal 06/13/19    DILATION AND CURETTAGE OF UTERUS     several in the early 80's   ESOPHAGOGASTRODUODENOSCOPY     2012   ESOPHAGOGASTRODUODENOSCOPY (EGD) WITH PROPOFOL N/A 03/11/2018   Procedure: ESOPHAGOGASTRODUODENOSCOPY (EGD) WITH PROPOFOL;  Surgeon: [name], MD;  Location: [facility] ENDOSCOPY;  Service: Gastroenterology;  Laterality: N/A;   EYE SURGERY     bilateral laser 2012   EYE SURGERY     right   EYE SURGERY     x4 both eyes   GAS INSERTION  09/30/2011   Procedure: INSERTION OF GAS;  Surgeon: [name], MD;  Location: [place];  Service: Ophthalmology;  Laterality: Right;  [id number]   GAS/FLUID EXCHANGE  09/30/2011   Procedure: GAS/FLUID EXCHANGE;  Surgeon: [name], MD;  Location: [place];  Service: Ophthalmology;  Laterality: Right;   KIDNEY TRANSPLANT     03/30/19 Dr. Mickel Baas [place]/Dr. Cristie Hem Zendel/Dr. Gilford Raid; [facility] transplant Dr. Horald Chestnut will be primary [phone] coordinator Sarah Wynkoop [phone]   KIDNEY TRANSPLANT     03/30/19 [facility]   LEFT HEART CATHETERIZATION WITH CORONARY ANGIOGRAM N/A 05/28/2013   Procedure: LEFT HEART CATHETERIZATION WITH CORONARY ANGIOGRAM;  Surgeon: [name], MD;  Location: [facility] CATH LAB;  Service: Cardiovascular;  Laterality: N/A;   PARS PLANA VITRECTOMY  04/22/2011   Procedure: PARS PLANA VITRECTOMY WITH 25 GAUGE;  Surgeon: [name], MD;  Location: [place];  Service: Ophthalmology;  Laterality: Left;  membrane peel, endolaser, gas fluid exchange, silicone oil, repair of complex traction retinal detachment   PARS PLANA VITRECTOMY  09/30/2011   Procedure: PARS PLANA VITRECTOMY WITH 25 GAUGE;  Surgeon: [name], MD;  Location: [place];  Service: Ophthalmology;  Laterality: Right;  Endolaser; Repair of Complex Traction Retinal Detachment   PARS PLANA VITRECTOMY  02/24/2012   Procedure: PARS PLANA VITRECTOMY WITH 25 GAUGE;  Surgeon: [name], MD;  Location: [place];  Service: Ophthalmology;  Laterality: Left;   PTCA     SILICON OIL REMOVAL  69/62/9528   Procedure: SILICON OIL REMOVAL;  Surgeon: [name], MD;  Location: [place];  Service: Ophthalmology;  Laterality: Left;   THROMBECTOMY / ARTERIOVENOUS GRAFT REVISION     TUBAL LIGATION     1979   UPPER EXTREMITY ANGIOGRAPHY Left 02/08/2019   Procedure: UPPER EXTREMITY ANGIOGRAPHY;  Surgeon: Katha Cabal, MD;  Location: [place] [facility];  Service: Cardiovascular;  Laterality: Left;    Prior to Admission medications   Medication Sig Start Date End Date Taking? Authorizing Provider  acetaminophen (TYLENOL) 500 MG tablet Take 1,000 mg by mouth daily as needed. 03/31/19   [provider]  albuterol (PROVENTIL) (2.5 MG/3ML) 0.083% nebulizer solution Take 3 mLs (2.5 mg total) by nebulization every 6 (six) hours as needed for wheezing or shortness of breath. Patient not taking: Reported on 04/05/2021 12/04/20   McLean-Scocuzza, Nino Glow, MD  albuterol (VENTOLIN HFA) 108 (90 Base) MCG/ACT inhaler Inhale 1-2 puffs into the lungs every 6 (six) hours as needed for wheezing or shortness of breath. Patient not taking: Reported on 04/05/2021 12/04/20   McLean-Scocuzza, Nino Glow, MD  amLODipine (NORVASC) 10 MG tablet Take 1 tablet (10 mg total) by mouth daily. 01/11/21   Minna Merritts, MD  atorvastatin (LIPITOR) 20 MG tablet Take 1 tablet (20 mg total) by mouth daily. 01/11/21   Minna Merritts, MD  carvedilol (COREG) 12.5 MG tablet Take 1 tablet (12.5 mg total) by mouth 2 (two) times daily. Patient not taking: Reported on 04/05/2021 12/09/20   Marrianne Mood D, PA-C  cloNIDine (CATAPRES) 0.2 MG tablet Take 1 tablet (0.2 mg total) by mouth 2 (two) times daily.  01/11/21   Minna Merritts, MD  ENVARSUS XR 1 MG TB24 Take 1 tablet by mouth daily as needed. 01/18/21   [provider]  esomeprazole (NEXIUM) 20 MG capsule Take 20 mg by mouth daily. 04/16/20   [provider]  furosemide (LASIX) 20 MG tablet Take 1 tablet (20 mg total) by mouth daily as needed (shortness of breath). Patient not taking: Reported on 04/05/2021 04/27/20   Minna Merritts, MD  hydrALAZINE (APRESOLINE) 50 MG tablet Take 1 tablet (50 mg total) by mouth 3 (three) times daily as needed. For BP greater then 140 Patient not taking: Reported on 04/05/2021 01/11/21   Minna Merritts, MD  loratadine (CLARITIN) 10 MG tablet Take 10 mg by mouth daily.    [provider]  magnesium oxide (MAG-OX) 400 MG tablet Take 400 mg by mouth 2 (two) times daily.    [provider]  nitroGLYCERIN (NITROSTAT) 0.4 MG SL tablet Place 1 tablet (0.4 mg total) under the tongue every 5 (five) minutes as needed for chest pain. Patient not taking: Reported on 04/05/2021 04/27/20   Minna Merritts, MD  oxyCODONE-acetaminophen (PERCOCET/ROXICET) 5-325 MG tablet Take 1 tablet by mouth 2 (two) times daily as needed for severe pain. Patient not taking: No sig reported 12/04/20   McLean-Scocuzza, Nino Glow, MD  pregabalin (LYRICA) 75 MG capsule Take 1 capsule (75 mg total) by mouth 2 (two) times daily. 12/04/20   McLean-Scocuzza, Nino Glow, MD  sodium bicarbonate 650 MG tablet Take 650 mg by mouth 2 (two) times daily.    [provider]  Tacrolimus ER 4 MG TB24 Take 8 mg by mouth every morning.    [provider]  Vitamin D, Ergocalciferol, (DRISDOL) 1.25 MG (50000 UNIT) CAPS capsule Take 50,000 Units by mouth every 7 (seven) days. Mondays  [provider]    Allergies Hydrocodone and Lisinopril  Family History  Problem Relation Age of Onset   Stroke Mother    Heart attack Mother    Heart disease Mother    Colon cancer Father    Colon cancer Sister     Breast cancer Sister    Heart attack Brother    Stroke Brother    Diabetes Brother    Diabetes Brother    Diabetes Daughter    Renal Disease Daughter        on HD   Kidney failure Daughter        age 76 as of 12/03/20   Anesthesia problems Neg Hx    Hypotension Neg Hx    Malignant hyperthermia Neg Hx    Pseudochol deficiency Neg Hx     Social History Social History   Tobacco Use   Smoking status: Former    Packs/day: 1.00    Years: 25.00    Pack years: 25.00    Types: Cigarettes    Quit date: 06/09/2010    Years since quitting: 10.9   Smokeless tobacco: Never  Vaping Use   Vaping Use: Never used  Substance Use Topics   Alcohol use: No   Drug use: No    Review of Systems Constitutional: No fever. Eyes: No visual changes. ENT: No sore throat. Cardiovascular: + chest pain. Respiratory: + shortness of breath. Gastrointestinal: No vomiting, diarrhea. Genitourinary: Negative for dysuria. Musculoskeletal: Negative for back pain. Skin: Negative for rash. Neurological: Negative for focal weakness or numbness.  ____________________________________________   PHYSICAL EXAM:  VITAL SIGNS: ED Triage Vitals  Enc Vitals Group     BP 05/17/21 2209 (!) 168/53     Pulse Rate 05/17/21 2209 70     Resp 05/17/21 2209 18     Temp 05/17/21 2209 98.1 F (36.7 C)     Temp Source 05/17/21 2209 Oral     SpO2 05/17/21 2209 96 %     Weight 05/17/21 2205 157 lb (71.2 kg)     Height 05/17/21 2205 5' 7.5" (1.715 m)     Head Circumference --      Peak Flow --      Pain Score 05/17/21 2205 0     Pain Loc --      Pain Edu? --      Excl. in [place]? --    CONSTITUTIONAL: Alert and oriented and responds appropriately to questions.  Chronically ill-appearing, appears older than stated age HEAD: Normocephalic EYES: Conjunctivae clear, pupils appear equal, EOM appear intact ENT: normal nose; moist mucous membranes NECK: Supple, normal ROM CARD: RRR; S1 and S2 appreciated; no murmurs, no  clicks, no rubs, no gallops RESP: Normal chest excursion without splinting or tachypnea; breath sounds clear and equal bilaterally; no wheezes, no rhonchi, no rales, no hypoxia or respiratory distress, speaking full sentences ABD/GI: Normal bowel sounds; non-distended; soft, non-tender, no rebound, no guarding, no peritoneal signs, no hepatosplenomegaly BACK: The back appears normal EXT: Normal ROM in all joints; no deformity noted, no edema; no cyanosis, no calf tenderness or calf swelling SKIN: Normal color for age and race; warm; no rash on exposed skin NEURO: Moves all extremities equally PSYCH: The patient's mood and manner are appropriate.  ____________________________________________   LABS (all labs ordered are listed, but only abnormal results are displayed)  Labs Reviewed  BASIC METABOLIC PANEL - Abnormal; Notable for the following components:      Result Value   Sodium 129 (*)  Glucose, Bld 339 (*)    Creatinine, Ser 1.62 (*)    GFR, Estimated 35 (*)    All other components within normal limits  CBC - Abnormal; Notable for the following components:   RBC 3.02 (*)    Hemoglobin 9.6 (*)    HCT 28.3 (*)    All other components within normal limits  BLOOD GAS, VENOUS - Abnormal; Notable for the following components:   pCO2, Ven 38 (*)    Acid-base deficit 3.0 (*)    All other components within normal limits  CBG MONITORING, ED - Abnormal; Notable for the following components:   Glucose-Capillary 352 (*)    All other components within normal limits  TROPONIN I (HIGH SENSITIVITY) - Abnormal; Notable for the following components:   Troponin I (High Sensitivity) 20 (*)    All other components within normal limits  TROPONIN I (HIGH SENSITIVITY) - Abnormal; Notable for the following components:   Troponin I (High Sensitivity) 27 (*)    All other components within normal limits  RESP PANEL BY RT-PCR (FLU A&B, COVID) ARPGX2  URINALYSIS, ROUTINE W REFLEX MICROSCOPIC   HEMOGLOBIN A1C  HIV ANTIBODY (ROUTINE TESTING W REFLEX)  CREATININE, SERUM   ____________________________________________  EKG   EKG Interpretation  Date/Time:  Friday May 17 2021 22:17:26 EST Ventricular Rate:  68 PR Interval:  174 QRS Duration: 96 QT Interval:  416 QTC Calculation: 442 R Axis:   51 Text Interpretation: Normal sinus rhythm Left ventricular hypertrophy with repolarization abnormality ( S[place]olow-Lyon ) Abnormal ECG No significant change since last tracing Confirmed by Pryor Curia [phone]) on 05/18/2021 5:07:35 AM        ____________________________________________  RADIOLOGY Jessie Foot Belicia Difatta, personally viewed and evaluated these images (plain radiographs) as part of my medical decision making, as well as reviewing the written report by the radiologist.  ED MD interpretation: Abdominal x-ray shows moderate fecal retention.  No acute process noted in the chest.  No bowel obstruction.  Official radiology report(s): DG Abd Acute W/Chest  Result Date: 05/17/2021 CLINICAL DATA:  Constipation, panic attack EXAM: DG ABDOMEN ACUTE WITH 1 VIEW CHEST COMPARISON:  12/20/2020 FINDINGS: Supine and upright frontal views of the abdomen and pelvis as well as an upright frontal view of the chest are obtained. Cardiac silhouette is unremarkable. No airspace disease, effusion, or pneumothorax. Vascular stent overlies left upper chest. Bowel gas pattern is unremarkable without obstruction or ileus. Moderate retained stool throughout the colon. No masses or abnormal calcifications. No free gas in the greater peritoneal sac. Bony structures are unremarkable. IMPRESSION: 1. Moderate fecal retention. 2. No bowel obstruction or ileus. 3. No acute intrathoracic process. Electronically Signed   By: Randa Ngo M.D.   On: 05/17/2021 23:59    ____________________________________________   PROCEDURES  Procedure(s) performed (including Critical  Care):  Procedures   ____________________________________________   INITIAL IMPRESSION / ASSESSMENT AND PLAN / ED COURSE  As part of my medical decision making, I reviewed the following data within the [place] notes reviewed and incorporated, Labs reviewed , EKG interpreted , Old EKG reviewed, Old chart reviewed, Radiograph reviewed , Discussed with admitting physician , and Notes from prior ED visits         Patient here with complaints of chest pain.  Does have a cardiac history with previous stent in 2014.  She states this feels like her anginal equivalent.  Her EKG shows no new ischemic change and she is currently asymptomatic.  Her first  troponin was 20 and her second is slightly higher at 27.  These troponin seem higher than her previous troponins in 2021.  Chest x-ray shows no acute abnormality.  No sign of volume overload.  Discussed with her that I am concerned for ACS given slightly elevated troponins and her history.  I recommended observation admission for chest pain rule out and she agrees.  Given she is asymptomatic have low suspicion for PE, dissection.  Patient is also complaining of constipation.  X-ray shows no bowel obstruction or ileus.  Abdominal exam is benign.  No vomiting.  Do not feel she needs CT imaging.  X-ray shows moderate fecal retention.  Will give soapsuds enema.  She is also concerned about her blood sugar being elevated.  She is not in DKA today.  We will recheck her CBG.  She states prior to EMS checking her blood sugar she had just drank juice.  She denies having history of diabetes however this is listed in her chart.  Blood pressure is still slightly elevated.  We will continue to monitor.  Will discuss with hospitalist for admission.  ED PROGRESS     3:45 AM Discussed patient's case with hospitalist, Dr. Damita Dunnings.  I have recommended admission and patient (and family if present) agree with this plan. Admitting physician  will place admission orders.   I reviewed all nursing notes, vitals, pertinent previous records and reviewed/interpreted all EKGs, lab and urine results, imaging (as available).  ____________________________________________   FINAL CLINICAL IMPRESSION(S) / ED DIAGNOSES  Final diagnoses:  Chest pain, unspecified type  Hyperglycemia  Constipation, unspecified constipation type     ED Discharge Orders     None       *Please note:  Theresa [name] was evaluated in Emergency Department on 05/18/2021 for the symptoms described in the history of present illness. She was evaluated in the context of the global COVID-19 pandemic, which necessitated consideration that the patient might be at risk for infection with the SARS-CoV-2 virus that causes COVID-19. Institutional protocols and algorithms that pertain to the evaluation of patients at risk for COVID-19 are in a state of rapid change based on information released by regulatory bodies including the CDC and federal and state organizations. These policies and algorithms were followed during the patient's care in the ED.  Some ED evaluations and interventions may be delayed as a result of limited staffing during and the pandemic.*   Note:  This document was prepared using Dragon voice recognition software and may include unintentional dictation errors.    Nazaiah Navarrete, Delice Bison, DO 05/18/21 [phone]

## 2021-05-18 NOTE — Progress Notes (Signed)
Patient ID: Theresa [name], female   DOB: [birth year], 65 y.o.   MRN: [id number] H&P reviewed.  Patient seen and examined.  This is a no charge note as patient was admitted this AM.  Theresa [name] is a 65 y.o. female with medical history significant for Renal transplant 2020 now with CKD 3B, G2 DD 04/2020, DM, HTN, CAD with history of MI, s/p stent who presents to the ED with a 3-hour history of intermittent substernal chest tightness unrelieved with oral nitroglycerin.  Pain was of moderate intensity nonradiating similar to prior MI.  States she had been coughing all day and she had a long bout of coughing immediately preceding the onset of the chest pain.  She had associated chills but no fever.  Denies shortness of breath.  Has been having recent constipation but denies abdominal pain or diarrhea.     Cta no r/w Reg s1/s2 no gallop Soft benign +bs No edema  A/P Cards consulted, input appreciated Patient noncompliant with her meds Holding carvedilol for now No further cardiac work-up for her chest pain cardiology recommends moving forward to continue clonidine 0.1 twice daily with amlodipine 5 mg twice daily Monitor BP overnight Told nursing since we do not have her renal transplant medication she can take her own as she has it here but nursing needs to keep it informed she needs to record the time when to dispense and how much.  I also discussed this with pharmacist Mrs.Manuela Schwartz.

## 2021-05-18 NOTE — ED Notes (Signed)
Pt ambulatory to the restroom. Pt was unable to get a urine sample at this time.

## 2021-05-18 NOTE — ED Notes (Signed)
Attempting soap suds enema at this time. Pt able to hold approx 586mL in at this time. Pt attempting to use the restroom at this time.

## 2021-05-19 DIAGNOSIS — J432 Centrilobular emphysema: Secondary | ICD-10-CM

## 2021-05-19 DIAGNOSIS — I25118 Atherosclerotic heart disease of native coronary artery with other forms of angina pectoris: Secondary | ICD-10-CM | POA: Diagnosis not present

## 2021-05-19 DIAGNOSIS — N1832 Chronic kidney disease, stage 3b: Secondary | ICD-10-CM | POA: Diagnosis not present

## 2021-05-19 DIAGNOSIS — Z9114 Patient's other noncompliance with medication regimen: Secondary | ICD-10-CM

## 2021-05-19 DIAGNOSIS — I10 Essential (primary) hypertension: Secondary | ICD-10-CM

## 2021-05-19 DIAGNOSIS — I160 Hypertensive urgency: Secondary | ICD-10-CM | POA: Diagnosis not present

## 2021-05-19 DIAGNOSIS — R079 Chest pain, unspecified: Secondary | ICD-10-CM | POA: Diagnosis not present

## 2021-05-19 LAB — BASIC METABOLIC PANEL
Anion gap: 7 (ref 5–15)
BUN: 20 mg/dL (ref 8–23)
CO2: 21 mmol/L — ABNORMAL LOW (ref 22–32)
Calcium: 9.3 mg/dL (ref 8.9–10.3)
Chloride: 104 mmol/L (ref 98–111)
Creatinine, Ser: 1.59 mg/dL — ABNORMAL HIGH (ref 0.44–1.00)
GFR, Estimated: 36 mL/min — ABNORMAL LOW (ref >60)
Glucose, Bld: 190 mg/dL — ABNORMAL HIGH (ref 70–99)
Potassium: 4.4 mmol/L (ref 3.5–5.1)
Sodium: 132 mmol/L — ABNORMAL LOW (ref 135–145)

## 2021-05-19 LAB — HIV ANTIBODY (ROUTINE TESTING W REFLEX): HIV Screen 4th Generation wRfx: NONREACTIVE

## 2021-05-19 LAB — TSH: TSH: 1.976 u[IU]/mL (ref 0.350–4.500)

## 2021-05-19 LAB — GLUCOSE, CAPILLARY
Glucose-Capillary: 128 mg/dL — ABNORMAL HIGH (ref 70–99)
Glucose-Capillary: 207 mg/dL — ABNORMAL HIGH (ref 70–99)
Glucose-Capillary: 228 mg/dL — ABNORMAL HIGH (ref 70–99)
Glucose-Capillary: 81 mg/dL (ref 70–99)

## 2021-05-19 LAB — MAGNESIUM: Magnesium: 2.5 mg/dL — ABNORMAL HIGH (ref 1.7–2.4)

## 2021-05-19 MED ORDER — AMLODIPINE BESYLATE 10 MG PO TABS
10.0000 mg | ORAL_TABLET | Freq: Every day | ORAL | Status: DC
  Administered 2021-05-19: 10 mg via ORAL
  Filled 2021-05-19: qty 1

## 2021-05-19 MED ORDER — TACROLIMUS ER 4 MG PO TB24
8.0000 mg | ORAL_TABLET | Freq: Every day | ORAL | Status: DC
  Administered 2021-05-19: 8 mg via ORAL
  Filled 2021-05-19 (×2): qty 2

## 2021-05-19 MED ORDER — TACROLIMUS ER 1 MG PO TB24
1.0000 mg | ORAL_TABLET | Freq: Every day | ORAL | Status: DC
  Administered 2021-05-19: 1 mg via ORAL
  Filled 2021-05-19 (×2): qty 1

## 2021-05-19 MED ORDER — POLYETHYLENE GLYCOL 3350 17 G PO PACK: 17.0000 g | PACK | Freq: Two times a day (BID) | ORAL | 0 refills | Status: AC

## 2021-05-19 MED ORDER — SENNOSIDES-DOCUSATE SODIUM 8.6-50 MG PO TABS: 2.0000 | ORAL_TABLET | Freq: Two times a day (BID) | ORAL | 0 refills | Status: AC

## 2021-05-19 MED ORDER — POLYETHYLENE GLYCOL 3350 17 G PO PACK
17.0000 g | PACK | Freq: Two times a day (BID) | ORAL | Status: DC
  Administered 2021-05-19: 17 g via ORAL
  Filled 2021-05-19: qty 1

## 2021-05-19 NOTE — TOC Transition Note (Signed)
Transition of Care [facility] [place]) - CM/SW Discharge Note   Patient Details  Name: CIRCE CHILTON MRN: [id number] Date of Birth: 1955/10/28  [facility]) CM/SW Contact:  Izola Price, RN Phone Number: 05/19/2021, 11:31 AM   Clinical Narrative: OBS status. Discharge to Home/Self Care. No TOC needs identified and confirmed by Unit RN.  Simmie Davies RN CM     Final next level of care: Home/Self Care Barriers to Discharge: Barriers Resolved   Patient Goals and CMS Choice        Discharge Placement                       Discharge Plan and Services                DME Arranged: N/A DME Agency: NA         HH Agency: NA        Social Determinants of Health ([place]) Interventions     Readmission Risk Interventions Readmission Risk Prevention Plan 04/20/2020  Transportation Screening Complete  PCP or Specialist Appt within 5-7 Days Not Complete  Not Complete comments To be scheduled by [place] Screening Complete  Medication Review (RN CM) Complete  Some recent data might be hidden

## 2021-05-19 NOTE — Discharge Summary (Signed)
Theresa [name] ZOX:[id number] DOB: 21-May-1956 DOA: 05/18/2021  PCP: McLean-Scocuzza, Nino Glow, MD  Admit date: 05/18/2021 Discharge date: 05/19/2021  Admitted From: home Disposition:  home  Recommendations for Outpatient Follow-up:  Follow up with PCP in 1 week Please obtain BMP/CBC in one week Please follow up cardiology in 1 to 2 weeks     Discharge Condition:Stable CODE STATUS: Full Diet recommendation: Heart Healthy  Brief/Interim Summary: Per Theresa [name] is a 65 y.o. female with medical history significant for Renal transplant 2020 now with CKD 3B, G2 DD 04/2020, DM, HTN, CAD with history of MI, s/p stent who presents to the ED with a 3-hour history of intermittent substernal chest tightness unrelieved with oral nitroglycerin.  Pain was of moderate intensity nonradiating similar to prior MI.  States she had been coughing all day and she had a long bout of coughing immediately preceding the onset of the chest pain.  ED course: BP 163/53 with otherwise normal vitals Blood work significant for troponin 20-27 Blood glucose 339, creatinine 1.62 (baseline), hemoglobin 9.6 which is baseline  NSR at 68 with no acute ST-T wave changes Abdominal x-ray1. Moderate fecal retention. 2. No bowel obstruction or ileus. 3. No acute intrathoracic process.   Patient was admitted to the hospital for further evaluation of her chest pain and blood pressure.  Cardiology was consulted.   Chest pain CAD with of MI s/p stent Hypertensive urgency Tp mildly eleveated. Likely due to demand ischemia Appears pt may not be complaint with medication. Echo nml EF, No WMA. Cardiology consulted, discussed with Dr. Rockey Situ, recommended continuing clonidine 0.1mg  bid with amlodipine on discharge. Bp bottomed out with clonidine 0.2mg  once No further ischemic work-up at this time BP better controlled on discharge      Hyperglycemia due to type 2 diabetes mellitus ([place]) Continue home meds        COPD  (chronic obstructive pulmonary disease) (HCC) Not acutely exacerbated Continue home inhalers   Post transplant CKD stage 3b  Anemia in chronic kidney disease (CKD) At baseline    History of renal transplant - Continue tacrolimus     Chronic diastolic CHF  -Appears euvolemic Continue BP control No room for beta-blockers at this, reevaluate as outpatient   Constipation Continue bowel regimen. Had small bowel here.   Discharge Diagnoses:  Principal Problem:   Coronary artery disease of native artery of native heart with stable angina pectoris (HCC) Active Problems:   COPD (chronic obstructive pulmonary disease) (HCC)   Stage 3b chronic kidney disease (HCC)   Anemia in chronic kidney disease (CKD)   Essential hypertension   S/P coronary artery stent placement   Chronic diastolic CHF (congestive heart failure) (HCC)   PAD (peripheral artery disease) (HCC)   Dementia (HCC)   Hyperglycemia due to type 2 diabetes mellitus ([place])   Chest pain    Discharge Instructions  Discharge Instructions     Call MD for:  severe uncontrolled pain   Complete by: As directed    Diet - low sodium heart healthy   Complete by: As directed    Increase activity slowly   Complete by: As directed       Allergies as of 05/19/2021       Reactions   Hydrocodone Hives   Lisinopril    Unknown reaction         Medication List     STOP taking these medications    docusate sodium 100 MG capsule Commonly known as: COLACE  furosemide 20 MG tablet Commonly known as: LASIX   hydrALAZINE 50 MG tablet Commonly known as: APRESOLINE   oxyCODONE-acetaminophen 5-325 MG tablet Commonly known as: PERCOCET/ROXICET       TAKE these medications    acetaminophen 500 MG tablet Commonly known as: TYLENOL Take 1,000 mg by mouth daily as needed.   albuterol 108 (90 Base) MCG/ACT inhaler Commonly known as: Ventolin HFA Inhale 1-2 puffs into the lungs every 6 (six) hours as needed for  wheezing or shortness of breath.   albuterol (2.5 MG/3ML) 0.083% nebulizer solution Commonly known as: PROVENTIL Take 3 mLs (2.5 mg total) by nebulization every 6 (six) hours as needed for wheezing or shortness of breath.   amLODipine 10 MG tablet Commonly known as: NORVASC Take 1 tablet (10 mg total) by mouth daily.   aspirin EC 81 MG tablet Take 81 mg by mouth daily. Swallow whole.   atorvastatin 20 MG tablet Commonly known as: LIPITOR Take 1 tablet (20 mg total) by mouth daily.   Catapres 0.1 MG tablet Generic drug: cloNIDine Take 1 tablet (0.1 mg total) by mouth 2 (two) times daily.   Envarsus XR 4 MG Tb24 Generic drug: tacrolimus ER Take 8 mg by mouth daily before breakfast. Total dose is 9 mg daily in the morning.   Envarsus XR 1 MG Tb24 Generic drug: tacrolimus ER Take 1 mg by mouth daily as needed. Total dose is 9 mg every morning at 0900.   esomeprazole 20 MG capsule Commonly known as: NEXIUM Take 20 mg by mouth daily.   loratadine 10 MG tablet Commonly known as: CLARITIN Take 10 mg by mouth daily.   magnesium oxide 400 MG tablet Commonly known as: MAG-OX Take 400 mg by mouth 2 (two) times daily.   nitroGLYCERIN 0.4 MG SL tablet Commonly known as: Nitrostat Place 1 tablet (0.4 mg total) under the tongue every 5 (five) minutes as needed for chest pain.   polyethylene glycol 17 g packet Commonly known as: MIRALAX / GLYCOLAX Take 17 g by mouth 2 (two) times daily for 14 days.   pregabalin 75 MG capsule Commonly known as: LYRICA Take 1 capsule (75 mg total) by mouth 2 (two) times daily.   senna-docusate 8.6-50 MG tablet Commonly known as: Senokot-S Take 2 tablets by mouth 2 (two) times daily for 10 days.   sodium bicarbonate 650 MG tablet Take 650 mg by mouth 2 (two) times daily.   Vitamin D (Ergocalciferol) 1.25 MG (50000 UNIT) Caps capsule Commonly known as: DRISDOL Take 50,000 Units by mouth every 7 (seven) days. Mondays        Follow-up  Information     McLean-Scocuzza, Nino Glow, MD Follow up in 1 week(s).   Specialty: Internal Medicine Contact information: [address] [place] [address] [phone]         Minna Merritts, MD Follow up in 1 week(s).   Specialty: Cardiology Contact information: [place] [address] [phone]                Allergies  Allergen Reactions   Hydrocodone Hives   Lisinopril     Unknown reaction     Consultations: Cardiology   Procedures/Studies: DG Abd Acute W/Chest  Result Date: 05/17/2021 CLINICAL DATA:  Constipation, panic attack EXAM: DG ABDOMEN ACUTE WITH 1 VIEW CHEST COMPARISON:  12/20/2020 FINDINGS: Supine and upright frontal views of the abdomen and pelvis as well as an upright frontal view of the chest are obtained.  Cardiac silhouette is unremarkable. No airspace disease, effusion, or pneumothorax. Vascular stent overlies left upper chest. Bowel gas pattern is unremarkable without obstruction or ileus. Moderate retained stool throughout the colon. No masses or abnormal calcifications. No free gas in the greater peritoneal sac. Bony structures are unremarkable. IMPRESSION: 1. Moderate fecal retention. 2. No bowel obstruction or ileus. 3. No acute intrathoracic process. Electronically Signed   By: Randa Ngo M.D.   On: 05/17/2021 23:59   ECHOCARDIOGRAM COMPLETE  Result Date: 05/18/2021    ECHOCARDIOGRAM REPORT   Patient Name:   ARDICE BOYAN Date of Exam: 05/18/2021 Medical Rec #:  [id number]     Height:       67.5 in Accession #:    [id number]    Weight:       157.0 lb Date of Birth:  1956/02/07     BSA:          1.835 m Patient Age:    51 years      BP:           96/53 mmHg Patient Gender: F             HR:           65 bpm. Exam Location:  [facility] Procedure: 2D Echo, Color Doppler and Cardiac Doppler Indications:     Chest Pain R07.9  History:         Patient has prior history of Echocardiogram examinations. CAD;                   Risk Factors:Hypertension.  Sonographer:     Alyse Low Roar Referring Phys:  [id number] Athena Masse Diagnosing Phys: Ida Rogue MD IMPRESSIONS  1. Left ventricular ejection fraction, by estimation, is 60 to 65%. The left ventricle has normal function. The left ventricle has no regional wall motion abnormalities. There is moderate left ventricular hypertrophy. Left ventricular diastolic parameters are consistent with Grade I diastolic dysfunction (impaired relaxation).  2. Right ventricular systolic function is normal. The right ventricular size is normal. Tricuspid regurgitation signal is inadequate for assessing PA pressure.  3. Left atrial size was mildly dilated.  4. The mitral valve is normal in structure. Moderate mitral valve regurgitation. No evidence of mitral stenosis.  5. The aortic valve was not well visualized. Aortic valve regurgitation is mild. Mild aortic valve stenosis. Aortic valve mean gradient measures 14.5 mmHg. Aortic valve Vmax measures 2.76 m/s.  6. The inferior vena cava is normal in size with greater than 50% respiratory variability, suggesting right atrial pressure of 3 mmHg. FINDINGS  Left Ventricle: Left ventricular ejection fraction, by estimation, is 60 to 65%. The left ventricle has normal function. The left ventricle has no regional wall motion abnormalities. The left ventricular internal cavity size was normal in size. There is  moderate left ventricular hypertrophy. Left ventricular diastolic parameters are consistent with Grade I diastolic dysfunction (impaired relaxation). Right Ventricle: The right ventricular size is normal. No increase in right ventricular wall thickness. Right ventricular systolic function is normal. Tricuspid regurgitation signal is inadequate for assessing PA pressure. Left Atrium: Left atrial size was mildly dilated. Right Atrium: Right atrial size was normal in size. Pericardium: There is no evidence of pericardial effusion. Mitral Valve: The  mitral valve is normal in structure. Mild mitral annular calcification. Moderate mitral valve regurgitation. No evidence of mitral valve stenosis. Tricuspid Valve: The tricuspid valve is normal in structure. Tricuspid valve regurgitation is mild . No evidence of tricuspid stenosis.  Aortic Valve: The aortic valve was not well visualized. Aortic valve regurgitation is mild. Aortic regurgitation PHT measures 408 msec. Mild aortic stenosis is present. Aortic valve mean gradient measures 14.5 mmHg. Aortic valve peak gradient measures 30.6 mmHg. Aortic valve area, by VTI measures 0.83 cm. Pulmonic Valve: The pulmonic valve was normal in structure. Pulmonic valve regurgitation is not visualized. No evidence of pulmonic stenosis. Aorta: The aortic root is normal in size and structure. Venous: The inferior vena cava is normal in size with greater than 50% respiratory variability, suggesting right atrial pressure of 3 mmHg. IAS/Shunts: No atrial level shunt detected by color flow Doppler.  LEFT VENTRICLE PLAX 2D LVIDd:         4.40 cm   Diastology LV PW:         1.20 cm   LV e' medial:    3.77 cm/s LV IVS:        1.20 cm   LV E/e' medial:  40.6 LVOT diam:     1.60 cm   LV e' lateral:   6.40 cm/s LV SV:         60        LV E/e' lateral: 23.9 LV SV Index:   33 LVOT Area:     2.01 cm  RIGHT VENTRICLE RV Basal diam:  3.10 cm RV Mid diam:    2.90 cm RV S prime:     8.85 cm/s TAPSE (M-mode): 2.2 cm LEFT ATRIUM             Index        RIGHT ATRIUM           Index LA diam:        4.40 cm 2.40 cm/m   RA Area:     11.00 cm LA Vol (A2C):   65.6 ml 35.76 ml/m  RA Volume:   22.40 ml  12.21 ml/m LA Vol (A4C):   71.5 ml 38.97 ml/m LA Biplane Vol: 69.3 ml 37.77 ml/m  AORTIC VALVE                     PULMONIC VALVE AV Area (Vmax):    1.02 cm      PV Vmax:          1.26 m/s AV Area (Vmean):   0.87 cm      PV Peak grad:     6.4 mmHg AV Area (VTI):     0.83 cm      PR End Diast Vel: 8.41 msec AV Vmax:           276.50 cm/s   RVOT  Peak grad:   3 mmHg AV Vmean:          175.000 cm/s AV VTI:            0.720 m AV Peak Grad:      30.6 mmHg AV Mean Grad:      14.5 mmHg LVOT Vmax:         140.00 cm/s LVOT Vmean:        76.000 cm/s LVOT VTI:          0.297 m LVOT/AV VTI ratio: 0.41 AI PHT:            408 msec  AORTA Ao Root diam: 2.30 cm Ao Asc diam:  2.70 cm MITRAL VALVE MV Area (PHT): 2.68 cm     SHUNTS MV Decel Time: 283 msec     Systemic VTI:  0.30 m MV E velocity: 153.00 cm/s  Systemic Diam: 1.60 cm MV A velocity: 166.00 cm/s MV E/A ratio:  0.92 MV A Prime:    5.0 cm/s Ida Rogue MD Electronically signed by Ida Rogue MD Signature Date/Time: 05/18/2021/5:10:02 PM    Final       Subjective: Has no chest pain or shortness of breath.  Discharge Exam: Vitals:   05/19/21 0814 05/19/21 1123  BP: 135/76 (!) 101/42  Pulse: 66 66  Resp: 18 18  Temp: 98 F (36.7 C) 99.3 F (37.4 C)  SpO2: 100% 100%   Vitals:   05/19/21 0013 05/19/21 0424 05/19/21 0814 05/19/21 1123  BP: (!) 161/35 (!) 116/41 135/76 (!) 101/42  Pulse: 60 61 66 66  Resp: 16 17 18 18   Temp: 98.5 F (36.9 C) (!) 97.5 F (36.4 C) 98 F (36.7 C) 99.3 F (37.4 C)  TempSrc:      SpO2: 100% 100% 100% 100%  Weight:      Height:        General: Pt is alert, awake, not in acute distress Cardiovascular: RRR, S1/S2 +, no rubs, no gallops Respiratory: CTA bilaterally, no wheezing, no rhonchi Abdominal: Soft, NT, ND, bowel sounds + Extremities: no edema, no cyanosis    The results of significant diagnostics from this hospitalization (including imaging, microbiology, ancillary and laboratory) are listed below for reference.     Microbiology: Recent Results (from the past 240 hour(s))  Resp Panel by RT-PCR (Flu A&B, Covid) Nasopharyngeal Swab     Status: None   Collection Time: 05/17/21 10:14 PM   Specimen: Nasopharyngeal Swab; Nasopharyngeal(NP) swabs in vial transport medium  Result Value Ref Range Status   SARS Coronavirus 2 by RT PCR  NEGATIVE NEGATIVE Final    Comment: (NOTE) SARS-CoV-2 target nucleic acids are NOT DETECTED.  The SARS-CoV-2 RNA is generally detectable in upper respiratory specimens during the acute phase of infection. The lowest concentration of SARS-CoV-2 viral copies this assay can detect is 138 copies/mL. A negative result does not preclude SARS-Cov-2 infection and should not be used as the sole basis for treatment or other patient management decisions. A negative result may occur with  improper specimen collection/handling, submission of specimen other than nasopharyngeal swab, presence of viral mutation(s) within the areas targeted by this assay, and inadequate number of viral copies(<138 copies/mL). A negative result must be combined with clinical observations, patient history, and epidemiological information. The expected result is Negative.  Fact Sheet for Patients:  EntrepreneurPulse.com.au  Fact Sheet for Healthcare Providers:  IncredibleEmployment.be  This test is no t yet approved or cleared by the [place] FDA and  has been authorized for detection and/or diagnosis of SARS-CoV-2 by FDA under an Emergency Use Authorization (EUA). This EUA will remain  in effect (meaning this test can be used) for the duration of the COVID-19 declaration under Section 564(b)(1) of the Act, 21 U.S.C.section 360bbb-3(b)(1), unless the authorization is terminated  or revoked sooner.       Influenza A by PCR NEGATIVE NEGATIVE Final   Influenza B by PCR NEGATIVE NEGATIVE Final    Comment: (NOTE) The Xpert Xpress SARS-CoV-2/FLU/RSV plus assay is intended as an aid in the diagnosis of influenza from Nasopharyngeal swab specimens and should not be used as a sole basis for treatment. Nasal washings and aspirates are unacceptable for Xpert Xpress SARS-CoV-2/FLU/RSV testing.  Fact Sheet for Patients: EntrepreneurPulse.com.au  Fact Sheet for  Healthcare Providers: IncredibleEmployment.be  This test is not yet approved or  cleared by the [place] and has been authorized for detection and/or diagnosis of SARS-CoV-2 by FDA under an Emergency Use Authorization (EUA). This EUA will remain in effect (meaning this test can be used) for the duration of the COVID-19 declaration under Section 564(b)(1) of the Act, 21 U.S.C. section 360bbb-3(b)(1), unless the authorization is terminated or revoked.  Performed at [facility], [address] [place], [place] [address]      Labs: BNP (last 3 results) No results for input(s): BNP in the last 8760 hours. Basic Metabolic Panel: Recent Labs  Lab 05/17/21 2214 05/18/21 2153 05/19/21 0602  NA 129* 134* 132*  K 4.6  --  4.4  CL 102  --  104  CO2 22  --  21*  GLUCOSE 339*  --  190*  BUN 22  --  20  CREATININE 1.62*  --  1.59*  CALCIUM 9.3  --  9.3  MG  --   --  2.5*   Liver Function Tests: No results for input(s): AST, ALT, ALKPHOS, BILITOT, PROT, ALBUMIN in the last 168 hours. No results for input(s): LIPASE, AMYLASE in the last 168 hours. No results for input(s): AMMONIA in the last 168 hours. CBC: Recent Labs  Lab 05/17/21 2214  WBC 6.1  HGB 9.6*  HCT 28.3*  MCV 93.7  PLT 158   Cardiac Enzymes: No results for input(s): CKTOTAL, CKMB, CKMBINDEX, TROPONINI in the last 168 hours. BNP: Invalid input(s): POCBNP CBG: Recent Labs  Lab 05/18/21 1947 05/19/21 0012 05/19/21 0421 05/19/21 0814 05/19/21 1123  GLUCAP 182* 207* 81 228* 128*   D-Dimer No results for input(s): DDIMER in the last 72 hours. Hgb A1c No results for input(s): HGBA1C in the last 72 hours. Lipid Profile No results for input(s): CHOL, HDL, LDLCALC, TRIG, CHOLHDL, LDLDIRECT in the last 72 hours. Thyroid function studies Recent Labs    05/19/21 0602  TSH 1.976   Anemia work up No results for input(s): VITAMINB12, FOLATE, FERRITIN, TIBC, IRON, RETICCTPCT  in the last 72 hours. Urinalysis    Component Value Date/Time   COLORURINE YELLOW (A) 04/19/2020 1640   APPEARANCEUR CLEAR (A) 04/19/2020 1640   APPEARANCEUR Hazy 06/26/2012 2030   LABSPEC 1.011 04/19/2020 1640   LABSPEC 1.010 06/26/2012 2030   PHURINE 7.0 04/19/2020 1640   GLUCOSEU NEGATIVE 04/19/2020 1640   GLUCOSEU 50 mg/dL 06/26/2012 2030   HGBUR NEGATIVE 04/19/2020 1640   BILIRUBINUR NEGATIVE 04/19/2020 1640   BILIRUBINUR Negative 06/26/2012 2030   KETONESUR NEGATIVE 04/19/2020 1640   PROTEINUR NEGATIVE 04/19/2020 1640   NITRITE NEGATIVE 04/19/2020 1640   LEUKOCYTESUR NEGATIVE 04/19/2020 1640   LEUKOCYTESUR Negative 06/26/2012 2030   Sepsis Labs Invalid input(s): PROCALCITONIN,  WBC,  LACTICIDVEN Microbiology Recent Results (from the past 240 hour(s))  Resp Panel by RT-PCR (Flu A&B, Covid) Nasopharyngeal Swab     Status: None   Collection Time: 05/17/21 10:14 PM   Specimen: Nasopharyngeal Swab; Nasopharyngeal(NP) swabs in vial transport medium  Result Value Ref Range Status   SARS Coronavirus 2 by RT PCR NEGATIVE NEGATIVE Final    Comment: (NOTE) SARS-CoV-2 target nucleic acids are NOT DETECTED.  The SARS-CoV-2 RNA is generally detectable in upper respiratory specimens during the acute phase of infection. The lowest concentration of SARS-CoV-2 viral copies this assay can detect is 138 copies/mL. A negative result does not preclude SARS-Cov-2 infection and should not be used as the sole basis for treatment or other patient management decisions. A negative result may occur with  improper specimen collection/handling, submission of specimen other than nasopharyngeal swab, presence of viral mutation(s) within the areas targeted by this assay, and inadequate number of viral copies(<138 copies/mL). A negative result must be combined with clinical observations, patient history, and epidemiological information. The expected result is Negative.  Fact Sheet for Patients:   EntrepreneurPulse.com.au  Fact Sheet for Healthcare Providers:  IncredibleEmployment.be  This test is no t yet approved or cleared by the [place] FDA and  has been authorized for detection and/or diagnosis of SARS-CoV-2 by FDA under an Emergency Use Authorization (EUA). This EUA will remain  in effect (meaning this test can be used) for the duration of the COVID-19 declaration under Section 564(b)(1) of the Act, 21 U.S.C.section 360bbb-3(b)(1), unless the authorization is terminated  or revoked sooner.       Influenza A by PCR NEGATIVE NEGATIVE Final   Influenza B by PCR NEGATIVE NEGATIVE Final    Comment: (NOTE) The Xpert Xpress SARS-CoV-2/FLU/RSV plus assay is intended as an aid in the diagnosis of influenza from Nasopharyngeal swab specimens and should not be used as a sole basis for treatment. Nasal washings and aspirates are unacceptable for Xpert Xpress SARS-CoV-2/FLU/RSV testing.  Fact Sheet for Patients: EntrepreneurPulse.com.au  Fact Sheet for Healthcare Providers: IncredibleEmployment.be  This test is not yet approved or cleared by the [place] FDA and has been authorized for detection and/or diagnosis of SARS-CoV-2 by FDA under an Emergency Use Authorization (EUA). This EUA will remain in effect (meaning this test can be used) for the duration of the COVID-19 declaration under Section 564(b)(1) of the Act, 21 U.S.C. section 360bbb-3(b)(1), unless the authorization is terminated or revoked.  Performed at [facility], [address], [place], [place] [address]      Time coordinating discharge: Over 30 minutes  SIGNED:   Nolberto Hanlon, MD  [facility] 05/19/2021, 4:38 PM Pager   If 7PM-7AM, please contact night-coverage [email or web address] Password [email or web address]

## 2021-05-19 NOTE — Progress Notes (Signed)
Discharge instructions explained to pt/verbalized understanding. IV and tele removed. Will transport off unit via wheelchair when rides arrives

## 2021-05-19 NOTE — Progress Notes (Signed)
Progress Note  Patient Name: Theresa [name] Date of Encounter: 05/19/2021  [facility] Cardiologist: Ida Rogue, MD   Subjective   Patient reports abdominal pain from constipation. No chest pain or SOB. BP better.   Inpatient Medications    Scheduled Meds:  aspirin EC  81 mg Oral Daily   enoxaparin (LOVENOX) injection  40 mg Subcutaneous Q24H   insulin aspart  0-9 Units Subcutaneous Q4H   tacrolimus ER  1 mg Oral QAC breakfast   And   tacrolimus ER  8 mg Oral QAC breakfast   Continuous Infusions:  PRN Meds: acetaminophen, ipratropium-albuterol, magnesium hydroxide, nitroGLYCERIN, ondansetron (ZOFRAN) IV   Vital Signs    Vitals:   05/18/21 1851 05/18/21 1944 05/19/21 0013 05/19/21 0424  BP: (!) 154/45 108/75 (!) 161/35 (!) 116/41  Pulse: 72 85 60 61  Resp: 18 18 16 17   Temp: 97.9 F (36.6 C) 97.8 F (36.6 C) 98.5 F (36.9 C) (!) 97.5 F (36.4 C)  TempSrc:      SpO2: 100% 100% 100% 100%  Weight:      Height:       No intake or output data in the 24 hours ending 05/19/21 0727 Last 3 Weights 05/17/2021 04/05/2021 01/11/2021  Weight (lbs) 157 lb 153 lb 158 lb  Weight (kg) 71.215 kg 69.4 kg 71.668 kg      Telemetry    NSR, HR 60s, PVCs - Personally Reviewed  ECG    No new - Personally Reviewed  Physical Exam   GEN: No acute distress.   Neck: No JVD Cardiac: RRR, +murmur, no rubs, or gallops.  Respiratory: Clear to auscultation bilaterally. GI: Soft, nontender, non-distended  MS: No edema; No deformity. Neuro:  Nonfocal  Psych: Normal affect   Labs    High Sensitivity Troponin:   Recent Labs  Lab 05/17/21 2214 05/18/21 0225  TROPONINIHS 20* 27*     Chemistry Recent Labs  Lab 05/17/21 2214 05/18/21 2153 05/19/21 0602  NA 129* 134*  --   K 4.6  --   --   CL 102  --   --   CO2 22  --   --   GLUCOSE 339*  --   --   BUN 22  --   --   CREATININE 1.62*  --   --   CALCIUM 9.3  --   --   MG  --   --  2.5*  GFRNONAA 35*  --   --    ANIONGAP 5  --   --     Lipids No results for input(s): CHOL, TRIG, HDL, LABVLDL, LDLCALC, CHOLHDL in the last 168 hours.  Hematology Recent Labs  Lab 05/17/21 2214  WBC 6.1  RBC 3.02*  HGB 9.6*  HCT 28.3*  MCV 93.7  MCH 31.8  MCHC 33.9  RDW 14.4  PLT 158   Thyroid No results for input(s): TSH, FREET4 in the last 168 hours.  BNPNo results for input(s): BNP, PROBNP in the last 168 hours.  DDimer No results for input(s): DDIMER in the last 168 hours.   Radiology    DG Abd Acute W/Chest  Result Date: 05/17/2021 CLINICAL DATA:  Constipation, panic attack EXAM: DG ABDOMEN ACUTE WITH 1 VIEW CHEST COMPARISON:  12/20/2020 FINDINGS: Supine and upright frontal views of the abdomen and pelvis as well as an upright frontal view of the chest are obtained. Cardiac silhouette is unremarkable. No airspace disease, effusion, or pneumothorax. Vascular stent overlies left upper chest.  Bowel gas pattern is unremarkable without obstruction or ileus. Moderate retained stool throughout the colon. No masses or abnormal calcifications. No free gas in the greater peritoneal sac. Bony structures are unremarkable. IMPRESSION: 1. Moderate fecal retention. 2. No bowel obstruction or ileus. 3. No acute intrathoracic process. Electronically Signed   By: Randa Ngo M.D.   On: 05/17/2021 23:59   ECHOCARDIOGRAM COMPLETE  Result Date: 05/18/2021    ECHOCARDIOGRAM REPORT   Patient Name:   Theresa [name] Date of Exam: 05/18/2021 Medical Rec #:  [id number]     Height:       67.5 in Accession #:    [id number]    Weight:       157.0 lb Date of Birth:  1955-06-11     BSA:          1.835 m Patient Age:    65 years      BP:           96/53 mmHg Patient Gender: F             HR:           65 bpm. Exam Location:  [facility] Procedure: 2D Echo, Color Doppler and Cardiac Doppler Indications:     Chest Pain R07.9  History:         Patient has prior history of Echocardiogram examinations. CAD;                  Risk Factors:Hypertension.   Sonographer:     Alyse Low Roar Referring Phys:  [id number] Athena Masse Diagnosing Phys: Ida Rogue MD IMPRESSIONS  1. Left ventricular ejection fraction, by estimation, is 60 to 65%. The left ventricle has normal function. The left ventricle has no regional wall motion abnormalities. There is moderate left ventricular hypertrophy. Left ventricular diastolic parameters are consistent with Grade I diastolic dysfunction (impaired relaxation).  2. Right ventricular systolic function is normal. The right ventricular size is normal. Tricuspid regurgitation signal is inadequate for assessing PA pressure.  3. Left atrial size was mildly dilated.  4. The mitral valve is normal in structure. Moderate mitral valve regurgitation. No evidence of mitral stenosis.  5. The aortic valve was not well visualized. Aortic valve regurgitation is mild. Mild aortic valve stenosis. Aortic valve mean gradient measures 14.5 mmHg. Aortic valve Vmax measures 2.76 m/s.  6. The inferior vena cava is normal in size with greater than 50% respiratory variability, suggesting right atrial pressure of 3 mmHg. FINDINGS  Left Ventricle: Left ventricular ejection fraction, by estimation, is 60 to 65%. The left ventricle has normal function. The left ventricle has no regional wall motion abnormalities. The left ventricular internal cavity size was normal in size. There is  moderate left ventricular hypertrophy. Left ventricular diastolic parameters are consistent with Grade I diastolic dysfunction (impaired relaxation). Right Ventricle: The right ventricular size is normal. No increase in right ventricular wall thickness. Right ventricular systolic function is normal. Tricuspid regurgitation signal is inadequate for assessing PA pressure. Left Atrium: Left atrial size was mildly dilated. Right Atrium: Right atrial size was normal in size. Pericardium: There is no evidence of pericardial effusion. Mitral Valve: The mitral valve is normal in structure.  Mild mitral annular calcification. Moderate mitral valve regurgitation. No evidence of mitral valve stenosis. Tricuspid Valve: The tricuspid valve is normal in structure. Tricuspid valve regurgitation is mild . No evidence of tricuspid stenosis. Aortic Valve: The aortic valve was not well visualized. Aortic valve regurgitation is mild. Aortic regurgitation  PHT measures 408 msec. Mild aortic stenosis is present. Aortic valve mean gradient measures 14.5 mmHg. Aortic valve peak gradient measures 30.6 mmHg. Aortic valve area, by VTI measures 0.83 cm. Pulmonic Valve: The pulmonic valve was normal in structure. Pulmonic valve regurgitation is not visualized. No evidence of pulmonic stenosis. Aorta: The aortic root is normal in size and structure. Venous: The inferior vena cava is normal in size with greater than 50% respiratory variability, suggesting right atrial pressure of 3 mmHg. IAS/Shunts: No atrial level shunt detected by color flow Doppler.  LEFT VENTRICLE PLAX 2D LVIDd:         4.40 cm   Diastology LV PW:         1.20 cm   LV e' medial:    3.77 cm/s LV IVS:        1.20 cm   LV E/e' medial:  40.6 LVOT diam:     1.60 cm   LV e' lateral:   6.40 cm/s LV SV:         60        LV E/e' lateral: 23.9 LV SV Index:   33 LVOT Area:     2.01 cm  RIGHT VENTRICLE RV Basal diam:  3.10 cm RV Mid diam:    2.90 cm RV S prime:     8.85 cm/s TAPSE (M-mode): 2.2 cm LEFT ATRIUM             Index        RIGHT ATRIUM           Index LA diam:        4.40 cm 2.40 cm/m   RA Area:     11.00 cm LA Vol (A2C):   65.6 ml 35.76 ml/m  RA Volume:   22.40 ml  12.21 ml/m LA Vol (A4C):   71.5 ml 38.97 ml/m LA Biplane Vol: 69.3 ml 37.77 ml/m  AORTIC VALVE                     PULMONIC VALVE AV Area (Vmax):    1.02 cm      PV Vmax:          1.26 m/s AV Area (Vmean):   0.87 cm      PV Peak grad:     6.4 mmHg AV Area (VTI):     0.83 cm      PR End Diast Vel: 8.41 msec AV Vmax:           276.50 cm/s   RVOT Peak grad:   3 mmHg AV Vmean:           175.000 cm/s AV VTI:            0.720 m AV Peak Grad:      30.6 mmHg AV Mean Grad:      14.5 mmHg LVOT Vmax:         140.00 cm/s LVOT Vmean:        76.000 cm/s LVOT VTI:          0.297 m LVOT/AV VTI ratio: 0.41 AI PHT:            408 msec  AORTA Ao Root diam: 2.30 cm Ao Asc diam:  2.70 cm MITRAL VALVE MV Area (PHT): 2.68 cm     SHUNTS MV Decel Time: 283 msec     Systemic VTI:  0.30 m MV E velocity: 153.00 cm/s  Systemic Diam: 1.60 cm MV A velocity: 166.00  cm/s MV E/A ratio:  0.92 MV A Prime:    5.0 cm/s Ida Rogue MD Electronically signed by Ida Rogue MD Signature Date/Time: 05/18/2021/5:10:02 PM    Final     Cardiac Studies   Echo 04/2020 1. Left ventricular ejection fraction, by estimation, is 60 to 65%. The  left ventricle has normal function. The left ventricle has no regional  wall motion abnormalities. There is mild left ventricular hypertrophy.  Left ventricular diastolic parameters  are consistent with Grade II diastolic dysfunction (pseudonormalization).   2. Right ventricular systolic function is normal. The right ventricular  size is normal.   3. Left atrial size was moderately dilated.   4. Moderate mitral valve regurgitation.   5. Aortic valve regurgitation is mild. Mild aortic valve stenosis.    LHC 2016 Conclusion   The left ventricular systolic function is normal. Mid LAD-1 lesion, 30% stenosed. Mid LAD-2 lesion, 20% stenosed. Prox Cx to Mid Cx lesion, 35% stenosed. 1st RPLB lesion, 30% stenosed. Ost RPDA lesion, 70% stenosed.   1. Significant one-vessel coronary artery disease with patent stent in the midright coronary artery. 70% ostial right PDA stenosis which is unchanged from most recent cardiac catheterization in 2014. A faint collateral is noted from right to left supplying what seems to be a small septal branch of the LAD. The left system has mild nonobstructive disease. 2. Normal LV systolic function. 3. Severely elevated blood pressure with moderately  elevated left ventricular end-diastolic pressure.   Recommendations: The patient's dyspnea is likely due to chronic diastolic heart failure with uncontrolled hypertension. Recommend blood pressure control. The patient was given 20 mg of IV labetalol and 20 mg of IV hydralazine during the case. I increased the dose of carvedilol to 25 mg twice a day and added amlodipine 5 mg once daily. The patient might require dialysis before hospital discharge. She is at low risk for kidney transplant from a cardiac standpoint.     Myoview Lexiscan 06/2017 Impressions:  - Normal myocardial perfusion study  - No evidence for significant ischemia or scar is noted.  - Post stress: Global systolic function is normal. The ejection fraction  calculated at 64%.  - Coronary and aortic calcifications are noted  - The right breast soft tissue mass is similar in size to previous study  03/18/2016  - The subcentimeter nodule is stable from previous study 03/18/16 and  03/07/14.   Patient Profile     65 y.o. female with a hx of CAD s/p 2014 RCA stenting with repeat LHC 2016 for positive stress test recommending medical management, moderate MR, HFpEF, ESRD presumed secondary to diabetic neuropathy previously on HD s/p deceased donor renal transplant on 03/2019, COPD, PVD, bilateral carotid artery disease, DM2 with diabetic neuropathy and retinopathy, sickle cell trait, HTN, HLD, esophageal stenosis with history of esophageal ulcers s/p dilation, remote tobacco use (quit 2013) who is being seen 05/18/2021 for the evaluation of chest pain  Assessment & Plan    Chest pain Panic attack Hypertensive Urgency - presented with possible panic attack and elevated BP . - HS trop 20>27 - Labs showed elevated BG s/p insulin - kidney function stable - BP bottomed out with clonidine 0.2mg  once - Question of compliance with meds, she says she takes clonidine and amlodipine - Restart coreg as able. She may need hydralazine.  -  echo showed LVEF 60-65%, no WMA, mod LVH, G1DD, LA mildly dilated, mod MR, mild AI, aortic mean gradient 14.22mmHg - will restart amlodipine 10mg  daily  CAD s/p prior stenting - chest pain in the setting of elevated BP and panic attack - HS trop mildly elevated not consistent with ACS - EKG with no significant changes - continue ASA - restart statin, reports she has not been taking statin - restart Coreg as able - She had a LHC in 2016 and Myoview in 2019 that was normal - echo with preserved EF - No further ischemic work-up at this time   Hyperglycemia DM2 - per IM   COPD - stable   CKD stage 3 H/o renal transplant - Scr stable   HFpEF - appears euvolemic on exam - does not take lasix daily - restart Coreg as able - repeat echo ordered  For questions or updates, please contact [facility] Please consult [email or web address] for contact info under        Signed, Jacelyn Cuen Ninfa Meeker, PA-C  05/19/2021, 7:27 AM

## 2021-05-21 ENCOUNTER — Encounter: Payer: Self-pay | Admitting: Internal Medicine

## 2021-05-21 ENCOUNTER — Other Ambulatory Visit: Payer: Self-pay

## 2021-05-21 ENCOUNTER — Ambulatory Visit (INDEPENDENT_AMBULATORY_CARE_PROVIDER_SITE_OTHER): Payer: Medicare Other | Admitting: Internal Medicine

## 2021-05-21 VITALS — BP 136/68 | HR 79 | Temp 96.3°F | Ht 67.5 in | Wt 160.6 lb

## 2021-05-21 DIAGNOSIS — E871 Hypo-osmolality and hyponatremia: Secondary | ICD-10-CM | POA: Diagnosis not present

## 2021-05-21 DIAGNOSIS — D649 Anemia, unspecified: Secondary | ICD-10-CM | POA: Diagnosis not present

## 2021-05-21 DIAGNOSIS — I10 Essential (primary) hypertension: Secondary | ICD-10-CM

## 2021-05-21 DIAGNOSIS — K5900 Constipation, unspecified: Secondary | ICD-10-CM | POA: Diagnosis not present

## 2021-05-21 DIAGNOSIS — R778 Other specified abnormalities of plasma proteins: Secondary | ICD-10-CM

## 2021-05-21 DIAGNOSIS — L659 Nonscarring hair loss, unspecified: Secondary | ICD-10-CM

## 2021-05-21 DIAGNOSIS — R0602 Shortness of breath: Secondary | ICD-10-CM

## 2021-05-21 DIAGNOSIS — T148XXA Other injury of unspecified body region, initial encounter: Secondary | ICD-10-CM

## 2021-05-21 LAB — CBC WITH DIFFERENTIAL/PLATELET
Basophils Absolute: 0 10*3/uL (ref 0.0–0.1)
Basophils Relative: 0.8 % (ref 0.0–3.0)
Eosinophils Absolute: 0.3 10*3/uL (ref 0.0–0.7)
Eosinophils Relative: 4.3 % (ref 0.0–5.0)
HCT: 29.8 % — ABNORMAL LOW (ref 36.0–46.0)
Hemoglobin: 10.1 g/dL — ABNORMAL LOW (ref 12.0–15.0)
Lymphocytes Relative: 16.4 % (ref 12.0–46.0)
Lymphs Abs: 1 10*3/uL (ref 0.7–4.0)
MCHC: 33.8 g/dL (ref 30.0–36.0)
MCV: 94 fl (ref 78.0–100.0)
Monocytes Absolute: 0.3 10*3/uL (ref 0.1–1.0)
Monocytes Relative: 4.3 % (ref 3.0–12.0)
Neutro Abs: 4.7 10*3/uL (ref 1.4–7.7)
Neutrophils Relative %: 74.2 % (ref 43.0–77.0)
Platelets: 196 10*3/uL (ref 150.0–400.0)
RBC: 3.17 Mil/uL — ABNORMAL LOW (ref 3.87–5.11)
RDW: 15.4 % (ref 11.5–15.5)
WBC: 6.3 10*3/uL (ref 4.0–10.5)

## 2021-05-21 LAB — IBC + FERRITIN
Ferritin: 518 ng/mL — ABNORMAL HIGH (ref 10.0–291.0)
Iron: 35 ug/dL — ABNORMAL LOW (ref 42–145)
Saturation Ratios: 15.9 % — ABNORMAL LOW (ref 20.0–50.0)
TIBC: 219.8 ug/dL — ABNORMAL LOW (ref 250.0–450.0)
Transferrin: 157 mg/dL — ABNORMAL LOW (ref 212.0–360.0)

## 2021-05-21 LAB — COMPREHENSIVE METABOLIC PANEL
ALT: 10 U/L (ref 0–35)
AST: 16 U/L (ref 0–37)
Albumin: 3.4 g/dL — ABNORMAL LOW (ref 3.5–5.2)
Alkaline Phosphatase: 112 U/L (ref 39–117)
BUN: 16 mg/dL (ref 6–23)
CO2: 25 mEq/L (ref 19–32)
Calcium: 9.5 mg/dL (ref 8.4–10.5)
Chloride: 104 mEq/L (ref 96–112)
Creatinine, Ser: 1.56 mg/dL — ABNORMAL HIGH (ref 0.40–1.20)
GFR: 35.09 mL/min — ABNORMAL LOW (ref >60.00)
Glucose, Bld: 174 mg/dL — ABNORMAL HIGH (ref 70–99)
Potassium: 4.8 mEq/L (ref 3.5–5.1)
Sodium: 133 mEq/L — ABNORMAL LOW (ref 135–145)
Total Bilirubin: 0.5 mg/dL (ref 0.2–1.2)
Total Protein: 6 g/dL (ref 6.0–8.3)

## 2021-05-21 LAB — HEMOGLOBIN A1C
Hgb A1c MFr Bld: 7.2 % — ABNORMAL HIGH (ref 4.8–5.6)
Mean Plasma Glucose: 160 mg/dL

## 2021-05-21 LAB — MAGNESIUM: Magnesium: 2.3 mg/dL (ref 1.5–2.5)

## 2021-05-21 LAB — TROPONIN I (HIGH SENSITIVITY): High Sens Troponin I: 17 ng/L (ref 2–17)

## 2021-05-21 MED ORDER — MUPIROCIN 2 % EX OINT: 1.0000 "application " | TOPICAL_OINTMENT | Freq: Two times a day (BID) | CUTANEOUS | 0 refills | Status: DC

## 2021-05-21 NOTE — Patient Instructions (Addendum)
Kiwis try Miralax 2 caps as needed max 8 total per GI in 1 day with Colace 1-2 tablets   [employer] rosemary oil scalp massage 4 minutes per day  Or essential oil rosemary 5 drops into shampoo  Dr. Marius Ditch 05/22/21 at 1:30 pm   [phone] Not available Theresa [name] [id number]      Specialties     Gastroenterology      Constipation, Adult Constipation is when a person has fewer than three bowel movements in a week, has difficulty having a bowel movement, or has stools (feces) that are dry, hard, or larger than normal. Constipation may be caused by an underlying condition. It may become worse with age if a person takes certain medicines and does not take in enough fluids. Follow these instructions at home: Eating and drinking  Eat foods that have a lot of fiber, such as beans, whole grains, and fresh fruits and vegetables. Limit foods that are low in fiber and high in fat and processed sugars, such as fried or sweet foods. These include french fries, hamburgers, cookies, candies, and soda. Drink enough fluid to keep your urine pale yellow. General instructions Exercise regularly or as told by your health care provider. Try to do 150 minutes of moderate exercise each week. Use the bathroom when you have the urge to go. Do not hold it in. Take over-the-counter and prescription medicines only as told by your health care provider. This includes any fiber supplements. During bowel movements: Practice deep breathing while relaxing the lower abdomen. Practice pelvic floor relaxation. Watch your condition for any changes. Let your health care provider know about them. Keep all follow-up visits as told by your health care provider. This is important. Contact a health care provider if: You have pain that gets worse. You have a fever. You do not have a bowel movement after 4 days. You vomit. You are not hungry or you lose weight. You are bleeding from the  opening between the buttocks (anus). You have thin, pencil-like stools. Get help right away if: You have a fever and your symptoms suddenly get worse. You leak stool or have blood in your stool. Your abdomen is bloated. You have severe pain in your abdomen. You feel dizzy or you faint. Summary Constipation is when a person has fewer than three bowel movements in a week, has difficulty having a bowel movement, or has stools (feces) that are dry, hard, or larger than normal. Eat foods that have a lot of fiber, such as beans, whole grains, and fresh fruits and vegetables. Drink enough fluid to keep your urine pale yellow. Take over-the-counter and prescription medicines only as told by your health care provider. This includes any fiber supplements. This information is not intended to replace advice given to you by your health care provider. Make sure you discuss any questions you have with your health care provider. Document Revised: 04/13/2019 Document Reviewed: 04/13/2019 Elsevier Patient Education  [place].

## 2021-05-22 ENCOUNTER — Encounter: Payer: Self-pay | Admitting: Gastroenterology

## 2021-05-22 ENCOUNTER — Ambulatory Visit (INDEPENDENT_AMBULATORY_CARE_PROVIDER_SITE_OTHER): Payer: Medicare Other | Admitting: Gastroenterology

## 2021-05-22 VITALS — BP 128/64 | HR 78 | Temp 97.8°F | Ht 67.0 in | Wt 160.0 lb

## 2021-05-22 DIAGNOSIS — L659 Nonscarring hair loss, unspecified: Secondary | ICD-10-CM | POA: Insufficient documentation

## 2021-05-22 DIAGNOSIS — R778 Other specified abnormalities of plasma proteins: Secondary | ICD-10-CM | POA: Insufficient documentation

## 2021-05-22 DIAGNOSIS — K5641 Fecal impaction: Secondary | ICD-10-CM

## 2021-05-22 DIAGNOSIS — K5909 Other constipation: Secondary | ICD-10-CM | POA: Diagnosis not present

## 2021-05-22 DIAGNOSIS — E871 Hypo-osmolality and hyponatremia: Secondary | ICD-10-CM

## 2021-05-22 DIAGNOSIS — R7989 Other specified abnormal findings of blood chemistry: Secondary | ICD-10-CM | POA: Insufficient documentation

## 2021-05-22 DIAGNOSIS — K5900 Constipation, unspecified: Secondary | ICD-10-CM | POA: Insufficient documentation

## 2021-05-22 HISTORY — DX: Hypo-osmolality and hyponatremia: E87.1

## 2021-05-22 MED ORDER — METFORMIN 500 MG TABLET
ORAL_TABLET | Freq: Two times a day (BID) | ORAL | 3 refills | 90.00000 days | Status: CP
Start: 2021-05-22 — End: 2022-05-22
  Filled 2021-05-24: qty 180, 90d supply, fill #0

## 2021-05-22 MED ORDER — PEG 3350-KCL-NA BICARB-NACL 420 G PO SOLR: 4000.0000 mL | Freq: Once | ORAL | 0 refills | Status: AC

## 2021-05-22 NOTE — Progress Notes (Signed)
Sent in nulytely  for clean out

## 2021-05-22 NOTE — Progress Notes (Signed)
Chief Complaint  Patient presents with   Hospitalization Follow-up   HFU with son Thi Klich for chest pain, sob, constipation and elevated troponin and abnormal labs  1. Chest pain with elevated trop and sob  Rec cards see in 1-2 weeks reached out to Dr. Rockey Situ with pts heart history   05/17/21 22:14 Troponin I (High Sensitivity): 20 (H)  05/18/21 02:25 Troponin I (High Sensitivity): 27 (H)  05/21/21 Trop 17 normal   2. S/p kidney transplant with labs CKD 3 and anemia cc [facility] transplant labs  3. C/o constipation not had good stool in 1 month with balls of stool appt Dr. Vicente Males 06/2021 messaged and Dr. Marius Ditch has appt 05/22/21 to work pt in [employer], dulcolax, prune juice which raised sugar to 427   4. Due to repeat mammogram diagnostic  5. Htn controlled today on norvasc 10 and clonidine 0.1 bid at [facility] 0.2 clonidine caused BP to be too low  6. Open wound right AC arm due to blood draw at hospital  7. Chronic pain 8/10 I.e back  Review of Systems  Constitutional:  Negative for weight loss.  HENT:  Negative for hearing loss.   Eyes:  Negative for blurred vision.  Respiratory:  Positive for shortness of breath.   Cardiovascular:  Negative for chest pain.  Gastrointestinal:  Positive for abdominal pain and constipation. Negative for blood in stool.  Genitourinary:  Negative for dysuria.  Musculoskeletal:  Negative for falls and joint pain.  Skin:  Negative for rash.  Neurological:  Negative for headaches.  Psychiatric/Behavioral:  Negative for depression.   Past Medical History:  Diagnosis Date   Anemia of chronic disease    Anginal pain ([place])    Carotid arterial disease ([place])    a. 02/2013 U/S: 40-59% bilat ICA stenosis.   Chronic constipation    Chronic diastolic CHF (congestive heart failure) ([place])    a. 10/2013 Echo [facility]): EF 55-60%, mod conc LVH, mod MR, mildly dil LA, mild Ao sclerosis w/o stenosis; b. 02/2016 Echo: EF 55-60%, no rwma, Gr DD, mild AS, mod to sev  MR, mildly dil LA, PASP 62mmHg; c. 07/2017 Echo [facility]): EF >55%, mild to mod LVH, Gr2 DD, Ao scl, Mod MR, mod dil LA, nl RV fxn.   Colon polyps    COPD (chronic obstructive pulmonary disease) (HCC)    Coronary artery disease    a. 05/2013 NSTEMI/PCI: LM 20d, LAD min irregs, LCX small, nl, OM1 nl, RCA dom 2m (2.5x16 Promus DES), PDA1 80p; b. 04/2015 Cath: LM nl, LAD 72m, D1/2 min irregs, LCX 35p/m, OM2/3 min irregs, RCA patent mid stent, RPDA 70ost, RPLB1 30, RPLB2/3 min irregs, EF 55-65%--> Med Rx; c. 06/2017 [facility]): No ischemia/infarct, EF 64%.   Diabetes mellitus    Diabetic neuropathy ([place])    Diabetic retinopathy ([place]) 05/28/2013   Hx bilat retinal detachment, proliferative diab retinopathy and bilat vitreous hemorrhage    Emphysema    ESRD on hemodialysis ([place])    a. [facility] in [place], [place] [phone]/Dr. [name], on a MWF schedule.  She started dialysis in Feb 2014.  Etiology of renal failure not known, likely diabetes.  Has a left upper arm AV graft.   History of bronchitis    Mar 2012   History of pneumonia    June 2012   History of tobacco abuse    a. Quit 2012.   Hyperlipidemia    Hypertension    Moderate to severe mitral insufficiency    a.  10/2013 Echo: EF 55-60%, mod MR; b. 02/2016 Echo: EF 55-60%, mod to sev MR directed posteriorly--felt to be dynamic -worse with volume overload; c. 07/2017 Echo [facility]): EF >55%. Mod MR.   Myocardial infarct [facility]) 05/2013   Peripheral vascular disease ([place])    Sickle cell trait ([place])    Thyroid nodule    [place] 06/2017 due to f/u US in 1 year    Past Surgical History:  Procedure Laterality Date   A/V FISTULAGRAM Left 11/10/2017   Procedure: A/V FISTULAGRAM;  Surgeon: Katha Cabal, MD;  Location: [place] CV [facility];  Service: Cardiovascular;  Laterality: Left;   A/V SHUNTOGRAM Left 02/08/2019   Procedure: A/V SHUNTOGRAM;  Surgeon: Katha Cabal, MD;  Location: [place] [facility];  Service: Cardiovascular;  Laterality: Left;    ABDOMINAL HYSTERECTOMY     2000 for cysts on ovaries total no abnormal pap   CARDIAC CATHETERIZATION     CARDIAC CATHETERIZATION N/A 05/02/2015   Procedure: Left Heart Cath and Coronary Angiography;  Surgeon: [name], MD;  Location: [place] CV [facility];  Service: Cardiovascular;  Laterality: N/A;   COLONOSCOPY WITH PROPOFOL N/A 07/08/2016   Procedure: COLONOSCOPY WITH PROPOFOL;  Surgeon: [name], MD;  Location: [facility] ENDOSCOPY;  Service: Endoscopy;  Laterality: N/A;   COLONOSCOPY WITH PROPOFOL N/A 08/20/2017   Procedure: COLONOSCOPY WITH PROPOFOL;  Surgeon: [name], MD;  Location: [facility] ENDOSCOPY;  Service: Gastroenterology;  Laterality: N/A;   colonscopy     CORONARY ANGIOPLASTY  05/28/2014   stent placement to the mid RCA   CYSTOURETHROSCOPY     [facility] urology with stent placement 11 or 05/2019 and stent removal 06/13/19    DILATION AND CURETTAGE OF UTERUS     several in the early 80's   ESOPHAGOGASTRODUODENOSCOPY     2012   ESOPHAGOGASTRODUODENOSCOPY (EGD) WITH PROPOFOL N/A 03/11/2018   Procedure: ESOPHAGOGASTRODUODENOSCOPY (EGD) WITH PROPOFOL;  Surgeon: [name], MD;  Location: [facility] ENDOSCOPY;  Service: Gastroenterology;  Laterality: N/A;   EYE SURGERY     bilateral laser 2012   EYE SURGERY     right   EYE SURGERY     x4 both eyes   GAS INSERTION  09/30/2011   Procedure: INSERTION OF GAS;  Surgeon: [name], MD;  Location: [place];  Service: Ophthalmology;  Laterality: Right;  [id number]   GAS/FLUID EXCHANGE  09/30/2011   Procedure: GAS/FLUID EXCHANGE;  Surgeon: [name], MD;  Location: [place];  Service: Ophthalmology;  Laterality: Right;   KIDNEY TRANSPLANT     03/30/19 Dr. Mickel Baas Thomas/Dr. Cristie Hem Zendel/Dr. Gilford Raid; [facility] transplant Dr. Horald Chestnut will be primary [phone] coordinator Sarah Wynkoop [phone]   KIDNEY TRANSPLANT     03/30/19 [facility]   LEFT HEART CATHETERIZATION WITH CORONARY ANGIOGRAM N/A 05/28/2013   Procedure: LEFT HEART CATHETERIZATION  WITH CORONARY ANGIOGRAM;  Surgeon: [name], MD;  Location: [facility] CATH LAB;  Service: Cardiovascular;  Laterality: N/A;   PARS PLANA VITRECTOMY  04/22/2011   Procedure: PARS PLANA VITRECTOMY WITH 25 GAUGE;  Surgeon: [name], MD;  Location: [place];  Service: Ophthalmology;  Laterality: Left;  membrane peel, endolaser, gas fluid exchange, silicone oil, repair of complex traction retinal detachment   PARS PLANA VITRECTOMY  09/30/2011   Procedure: PARS PLANA VITRECTOMY WITH 25 GAUGE;  Surgeon: [name], MD;  Location: [place];  Service: Ophthalmology;  Laterality: Right;  Endolaser; Repair of Complex Traction Retinal Detachment   PARS  PLANA VITRECTOMY  02/24/2012   Procedure: PARS PLANA VITRECTOMY WITH 25 GAUGE;  Surgeon: [name], MD;  Location: [place];  Service: Ophthalmology;  Laterality: Left;   PTCA     SILICON OIL REMOVAL  83/41/9622   Procedure: SILICON OIL REMOVAL;  Surgeon: [name], MD;  Location: [place];  Service: Ophthalmology;  Laterality: Left;   THROMBECTOMY / ARTERIOVENOUS GRAFT REVISION     TUBAL LIGATION     1979   UPPER EXTREMITY ANGIOGRAPHY Left 02/08/2019   Procedure: UPPER EXTREMITY ANGIOGRAPHY;  Surgeon: Katha Cabal, MD;  Location: [place] CV LAB;  Service: Cardiovascular;  Laterality: Left;   Family History  Problem Relation Age of Onset   Stroke Mother    Heart attack Mother    Heart disease Mother    Colon cancer Father    Colon cancer Sister    Breast cancer Sister    Heart attack Brother    Stroke Brother    Diabetes Brother    Diabetes Brother    Diabetes Daughter    Renal Disease Daughter        on HD   Kidney failure Daughter        age 14 as of 12/03/20   Anesthesia problems Neg Hx    Hypotension Neg Hx    Malignant hyperthermia Neg Hx    Pseudochol deficiency Neg Hx    Social History   Socioeconomic History   Marital status: Single    Spouse name: Not on file   Number of children: 2   Years of  education: Not on file   Highest education level: Not on file  Occupational History   Not on file  Tobacco Use   Smoking status: Every Day    Packs/day: 1.00    Years: 25.00    Pack years: 25.00    Types: Cigarettes    Last attempt to quit: 06/09/2010    Years since quitting: 10.9   Smokeless tobacco: Never  Vaping Use   Vaping Use: Never used  Substance and Sexual Activity   Alcohol use: No   Drug use: No   Sexual activity: Never  Other Topics Concern   Not on file  Social History Narrative   On disability.    Lives with son Brenton Grills   2 children (has son and daughter)      Son drives her since her vision has decreased   Social Determinants of Radio broadcast assistant Strain: Low Risk    Difficulty of Paying Living Expenses: Not hard at all  Food Insecurity: No Food Insecurity   Worried About Charity fundraiser in the Last Year: Never true   Arboriculturist in the Last Year: Never true  Transportation Needs: No Transportation Needs   Lack of Transportation (Medical): No   Lack of Transportation (Non-Medical): No  Physical Activity: Not on file  Stress: No Stress Concern Present   Feeling of Stress : Not at all  Social Connections: Unknown   Frequency of Communication with Friends and Family: More than three times a week   Frequency of Social Gatherings with Friends and Family: Not on file   Attends Religious Services: Not on file   Active Member of Clubs or Organizations: Not on file   Attends Archivist Meetings: Not on file   Marital Status: Not on file  Intimate Partner Violence: Not At Risk   Fear of Current or Ex-Partner: No  Emotionally Abused: No   Physically Abused: No   Sexually Abused: No   Current Meds  Medication Sig   acetaminophen (TYLENOL) 500 MG tablet Take 1,000 mg by mouth daily as needed.   albuterol (PROVENTIL) (2.5 MG/3ML) 0.083% nebulizer solution Take 3 mLs (2.5 mg total) by nebulization every 6 (six) hours as needed for  wheezing or shortness of breath.   albuterol (VENTOLIN HFA) 108 (90 Base) MCG/ACT inhaler Inhale 1-2 puffs into the lungs every 6 (six) hours as needed for wheezing or shortness of breath.   amLODipine (NORVASC) 10 MG tablet Take 1 tablet (10 mg total) by mouth daily.   aspirin EC 81 MG tablet Take 81 mg by mouth daily. Swallow whole.   atorvastatin (LIPITOR) 20 MG tablet Take 1 tablet (20 mg total) by mouth daily.   cloNIDine (CATAPRES) 0.1 MG tablet Take 1 tablet (0.1 mg total) by mouth 2 (two) times daily.   ENVARSUS XR 1 MG TB24 Take 1 mg by mouth daily as needed. Total dose is 9 mg every morning at 0900.   esomeprazole (NEXIUM) 20 MG capsule Take 20 mg by mouth daily.   loratadine (CLARITIN) 10 MG tablet Take 10 mg by mouth daily.   magnesium oxide (MAG-OX) 400 MG tablet Take 400 mg by mouth 2 (two) times daily.   mupirocin ointment (BACTROBAN) 2 % Apply 1 application topically 2 (two) times daily. Right arm   nitroGLYCERIN (NITROSTAT) 0.4 MG SL tablet Place 1 tablet (0.4 mg total) under the tongue every 5 (five) minutes as needed for chest pain.   polyethylene glycol (MIRALAX / GLYCOLAX) 17 g packet Take 17 g by mouth 2 (two) times daily for 14 days.   pregabalin (LYRICA) 75 MG capsule Take 1 capsule (75 mg total) by mouth 2 (two) times daily.   senna-docusate (SENOKOT-S) 8.6-50 MG tablet Take 2 tablets by mouth 2 (two) times daily for 10 days.   sodium bicarbonate 650 MG tablet Take 650 mg by mouth 2 (two) times daily.   tacrolimus ER (ENVARSUS XR) 4 MG TB24 Take 8 mg by mouth daily before breakfast. Total dose is 9 mg daily in the morning.   Vitamin D, Ergocalciferol, (DRISDOL) 1.25 MG (50000 UNIT) CAPS capsule Take 50,000 Units by mouth every 7 (seven) days. Mondays   Allergies  Allergen Reactions   Hydrocodone Hives   Lisinopril     Unknown reaction    Recent Results (from the past 2160 hour(s))  Blood gas, venous     Status: Abnormal   Collection Time: 05/17/21 10:08 PM  Result  Value Ref Range   pH, Ven 7.37 7.250 - 7.430   pCO2, Ven 38 (L) 44.0 - 60.0 mmHg   pO2, Ven 43.0 32.0 - 45.0 mmHg   Bicarbonate 22.0 20.0 - 28.0 mmol/L   Acid-base deficit 3.0 (H) 0.0 - 2.0 mmol/L   O2 Saturation 77.0 %   Patient temperature 37.0    Collection site VEIN    Sample type VENOUS     Comment: Performed at [facility], [address] [place], [place] [address]  CBG monitoring, ED     Status: Abnormal   Collection Time: 05/17/21 10:13 PM  Result Value Ref Range   Glucose-Capillary 352 (H) 70 - 99 mg/dL    Comment: Glucose reference range applies only to samples taken after fasting for at least 8 hours.   Comment 1 Notify RN    Comment 2 Document in Chart   Basic metabolic panel  Status: Abnormal   Collection Time: 05/17/21 10:14 PM  Result Value Ref Range   Sodium 129 (L) 135 - 145 mmol/L   Potassium 4.6 3.5 - 5.1 mmol/L   Chloride 102 98 - 111 mmol/L   CO2 22 22 - 32 mmol/L   Glucose, Bld 339 (H) 70 - 99 mg/dL    Comment: Glucose reference range applies only to samples taken after fasting for at least 8 hours.   BUN 22 8 - 23 mg/dL   Creatinine, Ser 1.62 (H) 0.44 - 1.00 mg/dL   Calcium 9.3 8.9 - 10.3 mg/dL   GFR, Estimated 35 (L) >60 mL/min    Comment: (NOTE) Calculated using the CKD-EPI Creatinine Equation (2021)    Anion gap 5 5 - 15    Comment: Performed at [facility], [address] [place], [place] [address]  CBC     Status: Abnormal   Collection Time: 05/17/21 10:14 PM  Result Value Ref Range   WBC 6.1 4.0 - 10.5 K/uL   RBC 3.02 (L) 3.87 - 5.11 MIL/uL   Hemoglobin 9.6 (L) 12.0 - 15.0 g/dL   HCT 28.3 (L) 36.0 - 46.0 %   MCV 93.7 80.0 - 100.0 fL   MCH 31.8 26.0 - 34.0 pg   MCHC 33.9 30.0 - 36.0 g/dL   RDW 14.4 11.5 - 15.5 %   Platelets 158 150 - 400 K/uL   nRBC 0.0 0.0 - 0.2 %    Comment: Performed at [facility], [address], [place], [place] [address]  Resp Panel by RT-PCR (Flu A&B, Covid) Nasopharyngeal Swab      Status: None   Collection Time: 05/17/21 10:14 PM   Specimen: Nasopharyngeal Swab; Nasopharyngeal(NP) swabs in vial transport medium  Result Value Ref Range   SARS Coronavirus 2 by RT PCR NEGATIVE NEGATIVE    Comment: (NOTE) SARS-CoV-2 target nucleic acids are NOT DETECTED.  The SARS-CoV-2 RNA is generally detectable in upper respiratory specimens during the acute phase of infection. The lowest concentration of SARS-CoV-2 viral copies this assay can detect is 138 copies/mL. A negative result does not preclude SARS-Cov-2 infection and should not be used as the sole basis for treatment or other patient management decisions. A negative result may occur with  improper specimen collection/handling, submission of specimen other than nasopharyngeal swab, presence of viral mutation(s) within the areas targeted by this assay, and inadequate number of viral copies(<138 copies/mL). A negative result must be combined with clinical observations, patient history, and epidemiological information. The expected result is Negative.  Fact Sheet for Patients:  EntrepreneurPulse.com.au  Fact Sheet for Healthcare Providers:  IncredibleEmployment.be  This test is no t yet approved or cleared by the [place] FDA and  has been authorized for detection and/or diagnosis of SARS-CoV-2 by FDA under an Emergency Use Authorization (EUA). This EUA will remain  in effect (meaning this test can be used) for the duration of the COVID-19 declaration under Section 564(b)(1) of the Act, 21 U.S.C.section 360bbb-3(b)(1), unless the authorization is terminated  or revoked sooner.       Influenza A by PCR NEGATIVE NEGATIVE   Influenza B by PCR NEGATIVE NEGATIVE    Comment: (NOTE) The Xpert Xpress SARS-CoV-2/FLU/RSV plus assay is intended as an aid in the diagnosis of influenza from Nasopharyngeal swab specimens and should not be used as a sole basis for treatment. Nasal  washings and aspirates are unacceptable for Xpert Xpress SARS-CoV-2/FLU/RSV testing.  Fact Sheet for Patients: EntrepreneurPulse.com.au  Fact  Sheet for Healthcare Providers: IncredibleEmployment.be  This test is not yet approved or cleared by the [place] and has been authorized for detection and/or diagnosis of SARS-CoV-2 by FDA under an Emergency Use Authorization (EUA). This EUA will remain in effect (meaning this test can be used) for the duration of the COVID-19 declaration under Section 564(b)(1) of the Act, 21 U.S.C. section 360bbb-3(b)(1), unless the authorization is terminated or revoked.  Performed at [facility], [place], [place] [address]   Troponin I (High Sensitivity)     Status: Abnormal   Collection Time: 05/17/21 10:14 PM  Result Value Ref Range   Troponin I (High Sensitivity) 20 (H) <18 ng/L    Comment: (NOTE) Elevated high sensitivity troponin I (hsTnI) values and significant  changes across serial measurements may suggest ACS but many other  chronic and acute conditions are known to elevate hsTnI results.  Refer to the "Links" section for chest pain algorithms and additional  guidance. Performed at [facility], [place], [place] [address]   Troponin I (High Sensitivity)     Status: Abnormal   Collection Time: 05/18/21  2:25 AM  Result Value Ref Range   Troponin I (High Sensitivity) 27 (H) <18 ng/L    Comment: (NOTE) Elevated high sensitivity troponin I (hsTnI) values and significant  changes across serial measurements may suggest ACS but many other  chronic and acute conditions are known to elevate hsTnI results.  Refer to the "Links" section for chest pain algorithms and additional  guidance. Performed at [facility], [address] [place], [place] [address]   CBG monitoring, ED     Status: Abnormal   Collection Time: 05/18/21  5:21 AM   Result Value Ref Range   Glucose-Capillary 204 (H) 70 - 99 mg/dL    Comment: Glucose reference range applies only to samples taken after fasting for at least 8 hours.  CBG monitoring, ED     Status: Abnormal   Collection Time: 05/18/21  7:51 AM  Result Value Ref Range   Glucose-Capillary 122 (H) 70 - 99 mg/dL    Comment: Glucose reference range applies only to samples taken after fasting for at least 8 hours.  ECHOCARDIOGRAM COMPLETE     Status: None   Collection Time: 05/18/21  1:49 PM  Result Value Ref Range   BP 96/53 mmHg   Ao pk vel 2.77 m/s   AV Area VTI 0.83 cm2   AR max vel 1.02 cm2   AV Mean grad 14.5 mmHg   AV Peak grad 30.6 mmHg   AV Area mean vel 0.87 cm2   Area-P 1/2 2.68 cm2   P 1/2 time 408 msec  Glucose, capillary     Status: Abnormal   Collection Time: 05/18/21  7:47 PM  Result Value Ref Range   Glucose-Capillary 182 (H) 70 - 99 mg/dL    Comment: Glucose reference range applies only to samples taken after fasting for at least 8 hours.  Sodium     Status: Abnormal   Collection Time: 05/18/21  9:53 PM  Result Value Ref Range   Sodium 134 (L) 135 - 145 mmol/L    Comment: Performed at [facility], [address] [place], [place] [address]  Glucose, capillary     Status: Abnormal   Collection Time: 05/19/21 12:12 AM  Result Value Ref Range   Glucose-Capillary 207 (H) 70 - 99 mg/dL    Comment: Glucose reference range applies  only to samples taken after fasting for at least 8 hours.  Glucose, capillary     Status: None   Collection Time: 05/19/21  4:21 AM  Result Value Ref Range   Glucose-Capillary 81 70 - 99 mg/dL    Comment: Glucose reference range applies only to samples taken after fasting for at least 8 hours.  Hemoglobin A1c     Status: Abnormal   Collection Time: 05/19/21  6:02 AM  Result Value Ref Range   Hgb A1c MFr Bld 7.2 (H) 4.8 - 5.6 %    Comment: (NOTE)         Prediabetes: 5.7 - 6.4         Diabetes: >6.4         Glycemic control  for adults with diabetes: <7.0    Mean Plasma Glucose 160 mg/dL    Comment: (NOTE) Performed At: [facility] [place], [place] [id number] Rush Farmer MD IO:[id number]   HIV Antibody (routine testing w rflx)     Status: None   Collection Time: 05/19/21  6:02 AM  Result Value Ref Range   HIV Screen 4th Generation wRfx Non Reactive Non Reactive    Comment: Performed at [facility], [place] [address], [place], [place] [address]  Magnesium     Status: Abnormal   Collection Time: 05/19/21  6:02 AM  Result Value Ref Range   Magnesium 2.5 (H) 1.7 - 2.4 mg/dL    Comment: Performed at [facility], [address] [place], [place] [address]  Basic metabolic panel     Status: Abnormal   Collection Time: 05/19/21  6:02 AM  Result Value Ref Range   Sodium 132 (L) 135 - 145 mmol/L   Potassium 4.4 3.5 - 5.1 mmol/L   Chloride 104 98 - 111 mmol/L   CO2 21 (L) 22 - 32 mmol/L   Glucose, Bld 190 (H) 70 - 99 mg/dL    Comment: Glucose reference range applies only to samples taken after fasting for at least 8 hours.   BUN 20 8 - 23 mg/dL   Creatinine, Ser 1.59 (H) 0.44 - 1.00 mg/dL   Calcium 9.3 8.9 - 10.3 mg/dL   GFR, Estimated 36 (L) >60 mL/min    Comment: (NOTE) Calculated using the CKD-EPI Creatinine Equation (2021)    Anion gap 7 5 - 15    Comment: Performed at [facility], [address] [place], [place] [address]  TSH     Status: None   Collection Time: 05/19/21  6:02 AM  Result Value Ref Range   TSH 1.976 0.350 - 4.500 uIU/mL    Comment: Performed by a 3rd Generation assay with a functional sensitivity of <=0.01 uIU/mL. Performed at [facility], [address] [place],  [address]   Glucose, capillary     Status: Abnormal   Collection Time: 05/19/21  8:14 AM  Result Value Ref Range   Glucose-Capillary 228 (H) 70 - 99 mg/dL    Comment: Glucose reference range applies only to samples taken after fasting for at least 8  hours.  Glucose, capillary     Status: Abnormal   Collection Time: 05/19/21 11:23 AM  Result Value Ref Range   Glucose-Capillary 128 (H) 70 - 99 mg/dL    Comment: Glucose reference range applies only to samples taken after fasting for at least 8 hours.  Comprehensive metabolic panel     Status: Abnormal   Collection Time: 05/21/21  3:06 PM  Result Value Ref Range   Sodium 133 (L) 135 - 145 mEq/L   Potassium 4.8 3.5 - 5.1 mEq/L   Chloride 104 96 - 112 mEq/L   CO2 25 19 - 32 mEq/L   Glucose, Bld 174 (H) 70 - 99 mg/dL   BUN 16 6 - 23 mg/dL   Creatinine, Ser 1.56 (H) 0.40 - 1.20 mg/dL   Total Bilirubin 0.5 0.2 - 1.2 mg/dL   Alkaline Phosphatase 112 39 - 117 U/L   AST 16 0 - 37 U/L   ALT 10 0 - 35 U/L   Total Protein 6.0 6.0 - 8.3 g/dL   Albumin 3.4 (L) 3.5 - 5.2 g/dL   GFR 35.09 (L) >60.00 mL/min    Comment: Calculated using the CKD-EPI Creatinine Equation (2021)   Calcium 9.5 8.4 - 10.5 mg/dL  IBC + Ferritin     Status: Abnormal   Collection Time: 05/21/21  3:06 PM  Result Value Ref Range   Iron 35 (L) 42 - 145 ug/dL   Transferrin 157.0 (L) 212.0 - 360.0 mg/dL   Saturation Ratios 15.9 (L) 20.0 - 50.0 %   Ferritin 518.0 (H) 10.0 - 291.0 ng/mL   TIBC 219.8 (L) 250.0 - 450.0 mcg/dL  CBC with Differential/Platelet     Status: Abnormal   Collection Time: 05/21/21  3:06 PM  Result Value Ref Range   WBC 6.3 4.0 - 10.5 K/uL   RBC 3.17 (L) 3.87 - 5.11 Mil/uL   Hemoglobin 10.1 (L) 12.0 - 15.0 g/dL   HCT 29.8 (L) 36.0 - 46.0 %   MCV 94.0 78.0 - 100.0 fl   MCHC 33.8 30.0 - 36.0 g/dL   RDW 15.4 11.5 - 15.5 %   Platelets 196.0 150.0 - 400.0 K/uL   Neutrophils Relative % 74.2 43.0 - 77.0 %   Lymphocytes Relative 16.4 12.0 - 46.0 %   Monocytes Relative 4.3 3.0 - 12.0 %   Eosinophils Relative 4.3 0.0 - 5.0 %   Basophils Relative 0.8 0.0 - 3.0 %   Neutro Abs 4.7 1.4 - 7.7 K/uL   Lymphs Abs 1.0 0.7 - 4.0 K/uL   Monocytes Absolute 0.3 0.1 - 1.0 K/uL   Eosinophils Absolute 0.3 0.0 - 0.7  K/uL   Basophils Absolute 0.0 0.0 - 0.1 K/uL  Magnesium     Status: None   Collection Time: 05/21/21  3:06 PM  Result Value Ref Range   Magnesium 2.3 1.5 - 2.5 mg/dL  Troponin I (High Sensitivity)     Status: None   Collection Time: 05/21/21  3:07 PM  Result Value Ref Range   High Sens Troponin I 17 2 - 17 ng/L   Objective  Body mass index is 24.78 kg/m. Wt Readings from Last 3 Encounters:  05/21/21 160 lb 9.6 oz (72.8 kg)  05/17/21 157 lb (71.2 kg)  04/05/21 153 lb (69.4 kg)   Temp Readings from Last 3 Encounters:  05/21/21 (!) 96.3 F (35.7 C) (Temporal)  05/19/21 99.3 F (37.4 C)  12/04/20 98.1 F (36.7 C) (Oral)   BP Readings from Last 3 Encounters:  05/21/21 136/68  05/19/21 (!) 101/42  04/05/21 135/60   Pulse Readings from Last 3 Encounters:  05/21/21 79  05/19/21 66  01/11/21 64    Physical Exam Vitals and nursing note reviewed.  Constitutional:      Appearance: Normal appearance. She is well-developed and well-groomed.  HENT:     Head: Normocephalic and atraumatic.  Eyes:     Conjunctiva/sclera:  Conjunctivae normal.     Pupils: Pupils are equal, round, and reactive to light.  Cardiovascular:     Rate and Rhythm: Normal rate and regular rhythm.     Heart sounds: Normal heart sounds. No murmur heard. Pulmonary:     Effort: Pulmonary effort is normal.     Breath sounds: Normal breath sounds.  Abdominal:     General: Abdomen is flat. Bowel sounds are normal.     Tenderness: There is no abdominal tenderness.  Musculoskeletal:        General: No tenderness.  Skin:    General: Skin is warm and dry.  Neurological:     General: No focal deficit present.     Mental Status: She is alert and oriented to person, place, and time. Mental status is at baseline.     Cranial Nerves: Cranial nerves 2-12 are intact.     Gait: Gait is intact.  Psychiatric:        Attention and Perception: Attention and perception normal.        Mood and Affect: Mood and affect  normal.        Speech: Speech normal.        Behavior: Behavior normal. Behavior is cooperative.        Thought Content: Thought content normal.        Cognition and Memory: Cognition and memory normal.        Judgment: Judgment normal.    Assessment  Plan  Constipation, appt Dr. Marius Ditch 05/22/21 then Dr. Bailey Mech appt sch 06/2021  Kiwis try Miralax 2 caps as needed max 8 total per GI in 1 day with Colace 1-2 tablets  Anemia, s/p transplant kidney with now ckd 3 - Plan: IBC + Ferritin, CBC with Differential/Platelet F/u [facility]/transplant   Magnesium disorder nl 05/21/21 labs - Plan: Magnesium  Hyponatremia - Plan: Comprehensive metabolic panel  SOB (shortness of breath) - Plan: Troponin I (High Sensitivity) normal but recently elevated slightly Reached out to Dr. Rockey Situ with pt cardiac history for appt sooner than 06/2021 advised if worse call 911 for hosp.   Open wound right AC - Plan: mupirocin ointment (BACTROBAN) 2 %  Hypertension, controlled on norvasc 10 and clonidine 0.1 bid   Hair loss [employer] rosemary oil scalp massage 4 minutes per day Or essential oil rosemary 5 drops into shampoo   HM Flu shot utd  pna 23 utd  prevnar utd covid pfizer  5/5 s/p renal transplant   Tetanus UTD 06/09/10 consider in future update this sent rx pharmacy 04/08/21  Hep B immune had engerix as well 4 doses 2014-2015  Consider shingrix in future disc prev pt unsure if insurance will cover it  HCV neg 04/25/19 Hep B sAb 98.50 03/28/19 HIV neg 03/28/19     Pap neg 11/13/16 neg HPV s/p hysterectomy  -will ask in future if h/o abnormal pap if so will do pap if not d/c paps    mammo 12/2020 breast calcifications needs repeat dx b/l in 6 months orders in    h/o multiple polyps colonoscopy 08/20/17 repeat in 3 years [facility] or [facility] can do this  -EGD 03/11/18 neg gastritis  -EGD ulcers 04/2019 neg HSV/H pylori [facility] egd  08/25/17 several polyps due 08/25/20 tubular adenoma  Referred  Dr. [name] appt 06/2021 and Dr. Marius Ditch 05/22/21    Former smoker x 25-30 years <1 ppd  CTA chest 01/12/18 moderate b/l pleural effusions and CAD and aortic atherosclerosis, no  nodules   ct chest had 09/12/20 [facility]    Thyroid nodules/parathyroid mass   [place] 06/16/17 right upper thyroid nodule ? Parathyroid mass 1.1 cm and left mid nodule f/u annually last 02/01/20    Thyroid US 02/01/20 [facility]  Additional similar solid cystic or cystic nodules identified, without suspicious features.  Incidentally noted nonocclusive atherosclerotic plaque noted in the right common carotid artery.  IMPRESSION:  No suspicious thyroid nodules identified.      GI appt [facility] 06/20/19 f/u esophageal ulcers Dr. [name] Cards Dr. Rockey Situ Renal [facility] and transplant s/p kidney transplant 03/28/20 [facility] exam sch 07/2021    Provider: Dr. Olivia Mackie McLean-Scocuzza-Internal Medicine

## 2021-05-22 NOTE — Progress Notes (Signed)
Cephas Darby, MD [address]  [place]  [place],  [address]  Main: [phone]  Fax: [phone] Pager: [phone]   Primary Care Physician: McLean-Scocuzza, Nino Glow, MD  Primary Gastroenterologist:  Dr. Vicente Males  Chief Complaint  Patient presents with   Constipation    No bm for 1 month in a lot of pain    HPI: Theresa [name] is a 65 y.o. female with history of chronic constipation, made an urgent visit to see me today as Dr. Vicente Males is on vacation, at the request of her PCP, Dr. Terese Door.  Patient reports that for last 1 month, she has been suffering from severe constipation, did not have a BM.  She tried several over-the-counter stool softeners, laxatives, had to enemas which not provide any relief.  Her PCP messaged me yesterday, was able to get her in today.  Patient reports that she has been feeling nauseous, with significant abdominal bloating, unable to have a BM for last 1 month.  Her p.o. intake has decreased.  She smokes marijuana for pain relief.  She drinks Pepsi, does not drink water.  Her diet is devoid of fiber.  Current Outpatient Medications  Medication Sig Dispense Refill   acetaminophen (TYLENOL) 500 MG tablet Take 1,000 mg by mouth daily as needed.     albuterol (PROVENTIL) (2.5 MG/3ML) 0.083% nebulizer solution Take 3 mLs (2.5 mg total) by nebulization every 6 (six) hours as needed for wheezing or shortness of breath. 75 mL 11   albuterol (VENTOLIN HFA) 108 (90 Base) MCG/ACT inhaler Inhale 1-2 puffs into the lungs every 6 (six) hours as needed for wheezing or shortness of breath. 1 each 12   amLODipine (NORVASC) 10 MG tablet Take 1 tablet (10 mg total) by mouth daily. 90 tablet 3   aspirin EC 81 MG tablet Take 81 mg by mouth daily. Swallow whole.     atorvastatin (LIPITOR) 20 MG tablet Take 1 tablet (20 mg total) by mouth daily. 90 tablet 3   cloNIDine (CATAPRES) 0.1 MG tablet Take 1 tablet (0.1 mg total) by mouth 2 (two) times daily. 60  tablet 11   ENVARSUS XR 1 MG TB24 Take 1 mg by mouth daily as needed. Total dose is 9 mg every morning at 0900.     esomeprazole (NEXIUM) 20 MG capsule Take 20 mg by mouth daily.     loratadine (CLARITIN) 10 MG tablet Take 10 mg by mouth daily.     magnesium oxide (MAG-OX) 400 MG tablet Take 400 mg by mouth 2 (two) times daily.     metFORMIN (GLUCOPHAGE) 500 MG tablet Take by mouth.     mupirocin ointment (BACTROBAN) 2 % Apply 1 application topically 2 (two) times daily. Right arm 30 g 0   nitroGLYCERIN (NITROSTAT) 0.4 MG SL tablet Place 1 tablet (0.4 mg total) under the tongue every 5 (five) minutes as needed for chest pain. 25 tablet 3   polyethylene glycol (MIRALAX / GLYCOLAX) 17 g packet Take 17 g by mouth 2 (two) times daily for 14 days. 28 packet 0   polyethylene glycol-electrolytes (NULYTELY) 420 g solution Take 4,000 mLs by mouth once for 1 dose. 4000 mL 0   pregabalin (LYRICA) 75 MG capsule Take 1 capsule (75 mg total) by mouth 2 (two) times daily. 60 capsule 5   senna-docusate (SENOKOT-S) 8.6-50 MG tablet Take 2 tablets by mouth 2 (two) times daily for 10 days. 40 tablet 0   sodium bicarbonate 650 MG tablet  Take 650 mg by mouth 2 (two) times daily.     tacrolimus ER (ENVARSUS XR) 4 MG TB24 Take 8 mg by mouth daily before breakfast. Total dose is 9 mg daily in the morning.     Vitamin D, Ergocalciferol, (DRISDOL) 1.25 MG (50000 UNIT) CAPS capsule Take 50,000 Units by mouth every 7 (seven) days. Mondays     No current facility-administered medications for this visit.    Allergies as of 05/22/2021 - Review Complete 05/22/2021  Allergen Reaction Noted   Hydrocodone Hives 08/20/2012   Lisinopril  03/11/2017    NSAIDs: None  Antiplts/Anticoagulants/Anti thrombotics: None  GI procedures: Colonoscopy 08/2017 by Dr. Vicente Males - Seven 5 to 7 mm polyps in the ascending colon, removed with a cold snare. Resected and retrieved. - Two 5 to 7 mm polyps in the descending colon and in the  transverse colon, removed with a cold snare. Resected and retrieved. - One 10 mm polyp in the descending colon, removed with a hot snare. Resected and retrieved. - The examination was otherwise normal. DIAGNOSIS:  A.  COLON POLYP X7, ASCENDING; COLD SNARE:  - TUBULAR ADENOMA (3).  - NEGATIVE FOR HIGH GRADE DYSPLASIA AND MALIGNANCY.   B.  COLON POLYP, TRANSVERSE; COLD SNARE:  - TUBULAR ADENOMA.  - NEGATIVE FOR HIGH GRADE DYSPLASIA AND MALIGNANCY.   C.  COLON POLYP X2, DESCENDING; HOT SNARE:  - TUBULAR ADENOMA (2).  - SERRATED POLYP, FAVOR EARLY SESSILE SERRATED ADENOMA (2).  - NEGATIVE FOR HIGH GRADE DYSPLASIA AND MALIGNANCY.  ROS:  General: Negative for anorexia, weight loss, fever, chills, fatigue, weakness. ENT: Negative for hoarseness, difficulty swallowing , nasal congestion. CV: Negative for chest pain, angina, palpitations, dyspnea on exertion, peripheral edema.  Respiratory: Negative for dyspnea at rest, dyspnea on exertion, cough, sputum, wheezing.  GI: See history of present illness. GU:  Negative for dysuria, hematuria, urinary incontinence, urinary frequency, nocturnal urination.  Endo: Negative for unusual weight change.    Physical Examination:   BP 128/64 (BP Location: Right Arm, Patient Position: Sitting, Cuff Size: Normal)    Pulse 78    Temp 97.8 F (36.6 C) (Oral)    Ht 5\' 7"  (1.702 m)    Wt 160 lb (72.6 kg)    BMI 25.06 kg/m   General: Well-nourished, well-developed in no acute distress.  Eyes: No icterus. Conjunctivae pink. Mouth: Oropharyngeal mucosa moist and pink , no lesions erythema or exudate. Lungs: Clear to auscultation bilaterally. Non-labored. Heart: Regular rate and rhythm, no murmurs rubs or gallops.  Abdomen: Bowel sounds are normal, diffusely distended, moderate, diffuse tenderness, no hepatosplenomegaly or masses, no hernia , no rebound or guarding.   Rectal exam: Revealed very hard stool balls in the rectum, impacted, manual disimpaction was  performed in the office Extremities: No lower extremity edema. No clubbing or deformities. Neuro: Alert and oriented x 3.  Grossly intact. Skin: Warm and dry, no jaundice.   Psych: Alert and cooperative, normal mood and affect.   Imaging Studies: DG Abd Acute W/Chest  Result Date: 05/17/2021 CLINICAL DATA:  Constipation, panic attack EXAM: DG ABDOMEN ACUTE WITH 1 VIEW CHEST COMPARISON:  12/20/2020 FINDINGS: Supine and upright frontal views of the abdomen and pelvis as well as an upright frontal view of the chest are obtained. Cardiac silhouette is unremarkable. No airspace disease, effusion, or pneumothorax. Vascular stent overlies left upper chest. Bowel gas pattern is unremarkable without obstruction or ileus. Moderate retained stool throughout the colon. No masses or abnormal calcifications. No free gas  in the greater peritoneal sac. Bony structures are unremarkable. IMPRESSION: 1. Moderate fecal retention. 2. No bowel obstruction or ileus. 3. No acute intrathoracic process. Electronically Signed   By: Randa Ngo M.D.   On: 05/17/2021 23:59   ECHOCARDIOGRAM COMPLETE  Result Date: 05/18/2021    ECHOCARDIOGRAM REPORT   Patient Name:   CARMEL GARFIELD Date of Exam: 05/18/2021 Medical Rec #:  [id number]     Height:       67.5 in Accession #:    [id number]    Weight:       157.0 lb Date of Birth:  August 18, 1955     BSA:          1.835 m Patient Age:    27 years      BP:           96/53 mmHg Patient Gender: F             HR:           65 bpm. Exam Location:  [facility] Procedure: 2D Echo, Color Doppler and Cardiac Doppler Indications:     Chest Pain R07.9  History:         Patient has prior history of Echocardiogram examinations. CAD;                  Risk Factors:Hypertension.  Sonographer:     Alyse Low Roar Referring Phys:  [id number] Athena Masse Diagnosing Phys: Ida Rogue MD IMPRESSIONS  1. Left ventricular ejection fraction, by estimation, is 60 to 65%. The left ventricle has normal function. The left  ventricle has no regional wall motion abnormalities. There is moderate left ventricular hypertrophy. Left ventricular diastolic parameters are consistent with Grade I diastolic dysfunction (impaired relaxation).  2. Right ventricular systolic function is normal. The right ventricular size is normal. Tricuspid regurgitation signal is inadequate for assessing PA pressure.  3. Left atrial size was mildly dilated.  4. The mitral valve is normal in structure. Moderate mitral valve regurgitation. No evidence of mitral stenosis.  5. The aortic valve was not well visualized. Aortic valve regurgitation is mild. Mild aortic valve stenosis. Aortic valve mean gradient measures 14.5 mmHg. Aortic valve Vmax measures 2.76 m/s.  6. The inferior vena cava is normal in size with greater than 50% respiratory variability, suggesting right atrial pressure of 3 mmHg. FINDINGS  Left Ventricle: Left ventricular ejection fraction, by estimation, is 60 to 65%. The left ventricle has normal function. The left ventricle has no regional wall motion abnormalities. The left ventricular internal cavity size was normal in size. There is  moderate left ventricular hypertrophy. Left ventricular diastolic parameters are consistent with Grade I diastolic dysfunction (impaired relaxation). Right Ventricle: The right ventricular size is normal. No increase in right ventricular wall thickness. Right ventricular systolic function is normal. Tricuspid regurgitation signal is inadequate for assessing PA pressure. Left Atrium: Left atrial size was mildly dilated. Right Atrium: Right atrial size was normal in size. Pericardium: There is no evidence of pericardial effusion. Mitral Valve: The mitral valve is normal in structure. Mild mitral annular calcification. Moderate mitral valve regurgitation. No evidence of mitral valve stenosis. Tricuspid Valve: The tricuspid valve is normal in structure. Tricuspid valve regurgitation is mild . No evidence of tricuspid  stenosis. Aortic Valve: The aortic valve was not well visualized. Aortic valve regurgitation is mild. Aortic regurgitation PHT measures 408 msec. Mild aortic stenosis is present. Aortic valve mean gradient measures 14.5 mmHg. Aortic valve peak gradient measures 30.6 mmHg.  Aortic valve area, by VTI measures 0.83 cm. Pulmonic Valve: The pulmonic valve was normal in structure. Pulmonic valve regurgitation is not visualized. No evidence of pulmonic stenosis. Aorta: The aortic root is normal in size and structure. Venous: The inferior vena cava is normal in size with greater than 50% respiratory variability, suggesting right atrial pressure of 3 mmHg. IAS/Shunts: No atrial level shunt detected by color flow Doppler.  LEFT VENTRICLE PLAX 2D LVIDd:         4.40 cm   Diastology LV PW:         1.20 cm   LV e' medial:    3.77 cm/s LV IVS:        1.20 cm   LV E/e' medial:  40.6 LVOT diam:     1.60 cm   LV e' lateral:   6.40 cm/s LV SV:         60        LV E/e' lateral: 23.9 LV SV Index:   33 LVOT Area:     2.01 cm  RIGHT VENTRICLE RV Basal diam:  3.10 cm RV Mid diam:    2.90 cm RV S prime:     8.85 cm/s TAPSE (M-mode): 2.2 cm LEFT ATRIUM             Index        RIGHT ATRIUM           Index LA diam:        4.40 cm 2.40 cm/m   RA Area:     11.00 cm LA Vol (A2C):   65.6 ml 35.76 ml/m  RA Volume:   22.40 ml  12.21 ml/m LA Vol (A4C):   71.5 ml 38.97 ml/m LA Biplane Vol: 69.3 ml 37.77 ml/m  AORTIC VALVE                     PULMONIC VALVE AV Area (Vmax):    1.02 cm      PV Vmax:          1.26 m/s AV Area (Vmean):   0.87 cm      PV Peak grad:     6.4 mmHg AV Area (VTI):     0.83 cm      PR End Diast Vel: 8.41 msec AV Vmax:           276.50 cm/s   RVOT Peak grad:   3 mmHg AV Vmean:          175.000 cm/s AV VTI:            0.720 m AV Peak Grad:      30.6 mmHg AV Mean Grad:      14.5 mmHg LVOT Vmax:         140.00 cm/s LVOT Vmean:        76.000 cm/s LVOT VTI:          0.297 m LVOT/AV VTI ratio: 0.41 AI PHT:            408  msec  AORTA Ao Root diam: 2.30 cm Ao Asc diam:  2.70 cm MITRAL VALVE MV Area (PHT): 2.68 cm     SHUNTS MV Decel Time: 283 msec     Systemic VTI:  0.30 m MV E velocity: 153.00 cm/s  Systemic Diam: 1.60 cm MV A velocity: 166.00 cm/s MV E/A ratio:  0.92 MV A Prime:    5.0 cm/s Ida Rogue MD Electronically signed by Ida Rogue MD  Signature Date/Time: 05/18/2021/5:10:02 PM    Final     Assessment and Plan:   VYCTORIA DICKMAN is a 65 y.o. female history of chronic marijuana use, history of diabetes, hypertension, chronic constipation is seen in consultation for worsening of chronic constipation.  Patient did not have a bowel movement in last 1 month.  Rectal exam revealed impacted stool in the rectum which was very hard, manual disimpaction was performed in the office today Recommend cleanout with bowel prep, prescription sent to her pharmacy Advised patient to stay on liquid diet only until she starts moving her bowels Advised patient to stop consuming fruit juices, carbonated beverages Advised patient to go to ER if her symptoms are worse Provided her with Linzess 145 MCG samples after cleanout She is due for surveillance colonoscopy in 08/2021  Follow up with Dr. Vicente Males   Dr Sherri Sear, MD

## 2021-05-23 ENCOUNTER — Ambulatory Visit: Payer: Medicare Other | Admitting: Gastroenterology

## 2021-05-28 ENCOUNTER — Emergency Department: Payer: Medicare Other

## 2021-05-28 ENCOUNTER — Emergency Department
Admission: EM | Admit: 2021-05-28 | Discharge: 2021-05-28 | Disposition: A | Discharge status: Home / Self Care | Payer: Medicare Other | Attending: Emergency Medicine | Admitting: Emergency Medicine

## 2021-05-28 ENCOUNTER — Encounter: Payer: Self-pay | Admitting: Emergency Medicine

## 2021-05-28 ENCOUNTER — Other Ambulatory Visit: Payer: Self-pay

## 2021-05-28 DIAGNOSIS — E1122 Type 2 diabetes mellitus with diabetic chronic kidney disease: Secondary | ICD-10-CM | POA: Diagnosis not present

## 2021-05-28 DIAGNOSIS — I132 Hypertensive heart and chronic kidney disease with heart failure and with stage 5 chronic kidney disease, or end stage renal disease: Secondary | ICD-10-CM | POA: Diagnosis not present

## 2021-05-28 DIAGNOSIS — Z79899 Other long term (current) drug therapy: Secondary | ICD-10-CM | POA: Diagnosis not present

## 2021-05-28 DIAGNOSIS — I2510 Atherosclerotic heart disease of native coronary artery without angina pectoris: Secondary | ICD-10-CM | POA: Diagnosis not present

## 2021-05-28 DIAGNOSIS — Z7984 Long term (current) use of oral hypoglycemic drugs: Secondary | ICD-10-CM | POA: Diagnosis not present

## 2021-05-28 DIAGNOSIS — Z7982 Long term (current) use of aspirin: Secondary | ICD-10-CM | POA: Diagnosis not present

## 2021-05-28 DIAGNOSIS — F17210 Nicotine dependence, cigarettes, uncomplicated: Secondary | ICD-10-CM | POA: Diagnosis not present

## 2021-05-28 DIAGNOSIS — J449 Chronic obstructive pulmonary disease, unspecified: Secondary | ICD-10-CM | POA: Diagnosis not present

## 2021-05-28 DIAGNOSIS — N186 End stage renal disease: Secondary | ICD-10-CM | POA: Diagnosis not present

## 2021-05-28 DIAGNOSIS — R0602 Shortness of breath: Secondary | ICD-10-CM | POA: Diagnosis not present

## 2021-05-28 DIAGNOSIS — D649 Anemia, unspecified: Secondary | ICD-10-CM | POA: Diagnosis not present

## 2021-05-28 DIAGNOSIS — Z940 Kidney transplant status: Secondary | ICD-10-CM | POA: Diagnosis not present

## 2021-05-28 DIAGNOSIS — J90 Pleural effusion, not elsewhere classified: Secondary | ICD-10-CM | POA: Diagnosis not present

## 2021-05-28 DIAGNOSIS — Z20822 Contact with and (suspected) exposure to covid-19: Secondary | ICD-10-CM | POA: Diagnosis not present

## 2021-05-28 DIAGNOSIS — Z992 Dependence on renal dialysis: Secondary | ICD-10-CM | POA: Insufficient documentation

## 2021-05-28 DIAGNOSIS — I5032 Chronic diastolic (congestive) heart failure: Secondary | ICD-10-CM | POA: Diagnosis not present

## 2021-05-28 DIAGNOSIS — R069 Unspecified abnormalities of breathing: Secondary | ICD-10-CM | POA: Diagnosis not present

## 2021-05-28 DIAGNOSIS — Z743 Need for continuous supervision: Secondary | ICD-10-CM | POA: Diagnosis not present

## 2021-05-28 LAB — BASIC METABOLIC PANEL
Anion gap: 4 — ABNORMAL LOW (ref 5–15)
BUN: 10 mg/dL (ref 8–23)
CO2: 22 mmol/L (ref 22–32)
Calcium: 9.2 mg/dL (ref 8.9–10.3)
Chloride: 109 mmol/L (ref 98–111)
Creatinine, Ser: 1.32 mg/dL — ABNORMAL HIGH (ref 0.44–1.00)
GFR, Estimated: 45 mL/min — ABNORMAL LOW (ref >60)
Glucose, Bld: 146 mg/dL — ABNORMAL HIGH (ref 70–99)
Potassium: 5.2 mmol/L — ABNORMAL HIGH (ref 3.5–5.1)
Sodium: 135 mmol/L (ref 135–145)

## 2021-05-28 LAB — TROPONIN I (HIGH SENSITIVITY)
Troponin I (High Sensitivity): 18 ng/L — ABNORMAL HIGH (ref <18)
Troponin I (High Sensitivity): 20 ng/L — ABNORMAL HIGH (ref <18)

## 2021-05-28 LAB — CBC
HCT: 26.5 % — ABNORMAL LOW (ref 36.0–46.0)
Hemoglobin: 8.6 g/dL — ABNORMAL LOW (ref 12.0–15.0)
MCH: 32.1 pg (ref 26.0–34.0)
MCHC: 32.5 g/dL (ref 30.0–36.0)
MCV: 98.9 fL (ref 80.0–100.0)
Platelets: 186 10*3/uL (ref 150–400)
RBC: 2.68 MIL/uL — ABNORMAL LOW (ref 3.87–5.11)
RDW: 15.6 % — ABNORMAL HIGH (ref 11.5–15.5)
WBC: 8.9 10*3/uL (ref 4.0–10.5)
nRBC: 0 % (ref 0.0–0.2)

## 2021-05-28 LAB — RESP PANEL BY RT-PCR (FLU A&B, COVID) ARPGX2
Influenza A by PCR: NEGATIVE
Influenza B by PCR: NEGATIVE
SARS Coronavirus 2 by RT PCR: NEGATIVE

## 2021-05-28 MED ORDER — IPRATROPIUM-ALBUTEROL 0.5-2.5 (3) MG/3ML IN SOLN
3.0000 mL | Freq: Once | RESPIRATORY_TRACT | Status: AC
  Administered 2021-05-28: 11:00:00 3 mL via RESPIRATORY_TRACT

## 2021-05-28 MED ORDER — PATIROMER SORBITEX CALCIUM 8.4 G PO PACK
8.4000 g | PACK | Freq: Once | ORAL | Status: DC
  Filled 2021-05-28: qty 1

## 2021-05-28 NOTE — ED Notes (Signed)
Per Dr's request this tech walked pt to the restroom while on a O2 monitor. O2 sats never dropped below 95, normally maintaining a level of 98%.

## 2021-05-28 NOTE — ED Triage Notes (Signed)
Pt arrived via [facility] from home with c/o SOB. Pt reports she woke this AM with SOB. O2 sat when EMS arrived was 99% room air.

## 2021-05-28 NOTE — ED Provider Notes (Addendum)
[facility] Emergency Department Provider Note   ____________________________________________   Event Date/Time   First MD Initiated Contact with Patient 05/28/21 1036     (approximate)  I have reviewed the triage vital signs and the nursing notes.   HISTORY  Chief Complaint Shortness of Breath    HPI Theresa [name] is a 65 y.o. female with past medical history of hypertension, diabetes, renal transplant with subsequent CKD, CAD, CHF, COPD, anemia, and PAD who presents to the ED complaining of shortness of breath.  Reports that she has been dealing with intermittent difficulty breathing for the past couple of weeks, however it has seemed more severe since last night.  It has been associated with a nonproductive cough as well as some dull aching pain in the center of her chest.  She denies any fevers and has not noticed any pain or swelling in her legs.  She has been using her inhaler at home without significant relief.  She was recently admitted to the hospital for similar chest discomfort with unremarkable work-up at that time.  She is also been dealing with constipation, states she was prescribed a bowel cleanout by her GI doctor but after using this has noticed some blood in her stool.  She does not take any blood thinners and denies any dark, tarry stools.        Past Medical History:  Diagnosis Date   Anemia of chronic disease    Anginal pain ([place])    Carotid arterial disease ([place])    a. 02/2013 U/S: 40-59% bilat ICA stenosis.   Chronic constipation    Chronic diastolic CHF (congestive heart failure) ([place])    a. 10/2013 Echo [facility]): EF 55-60%, mod conc LVH, mod MR, mildly dil LA, mild Ao sclerosis w/o stenosis; b. 02/2016 Echo: EF 55-60%, no rwma, Gr DD, mild AS, mod to sev MR, mildly dil LA, PASP 49mmHg; c. 07/2017 Echo [facility]): EF >55%, mild to mod LVH, Gr2 DD, Ao scl, Mod MR, mod dil LA, nl RV fxn.   Colon polyps    COPD (chronic obstructive pulmonary  disease) (HCC)    Coronary artery disease    a. 05/2013 NSTEMI/PCI: LM 20d, LAD min irregs, LCX small, nl, OM1 nl, RCA dom 92m (2.5x16 Promus DES), PDA1 80p; b. 04/2015 Cath: LM nl, LAD 32m, D1/2 min irregs, LCX 35p/m, OM2/3 min irregs, RCA patent mid stent, RPDA 70ost, RPLB1 30, RPLB2/3 min irregs, EF 55-65%--> Med Rx; c. 06/2017 [facility]): No ischemia/infarct, EF 64%.   Diabetes mellitus    Diabetic neuropathy ([place])    Diabetic retinopathy ([place]) 05/28/2013   Hx bilat retinal detachment, proliferative diab retinopathy and bilat vitreous hemorrhage    Emphysema    ESRD on hemodialysis ([place])    a. [facility] in [place], [place] [phone]/Dr. [name], on a MWF schedule.  She started dialysis in Feb 2014.  Etiology of renal failure not known, likely diabetes.  Has a left upper arm AV graft.   History of bronchitis    Mar 2012   History of pneumonia    June 2012   History of tobacco abuse    a. Quit 2012.   Hyperlipidemia    Hypertension    Moderate to severe mitral insufficiency    a. 10/2013 Echo: EF 55-60%, mod MR; b. 02/2016 Echo: EF 55-60%, mod to sev MR directed posteriorly--felt to be dynamic -worse with volume overload; c. 07/2017 Echo [facility]): EF >55%. Mod MR.   Myocardial infarct ([place])  05/2013   Peripheral vascular disease ([place])    Sickle cell trait ([place])    Thyroid nodule    [place] 06/2017 due to f/u US in 1 year     Patient Active Problem List   Diagnosis Date Noted   Constipation 05/22/2021   Elevated troponin 05/22/2021   Hyponatremia 05/22/2021   Hair loss 05/22/2021   Hyperglycemia due to type 2 diabetes mellitus ([place]) 05/18/2021   Chest pain 05/18/2021   Breast calcification seen on mammogram 04/08/2021   Lung nodules 12/04/2020   Thrombocytopenia ([place]) 12/04/2020   Hyperkalemia 04/18/2020   Hypertensive urgency 04/18/2020   Chronic pain syndrome 02/27/2020   Facet arthritis of cervical region 02/27/2020   Lumbar radiculopathy 02/27/2020   Hypercalcemia 02/27/2020   Aortic  atherosclerosis ([place]) 02/24/2020   Myelopathy ([place]) 01/23/2020   Bilateral leg weakness 01/06/2020   Bilateral leg and foot pain 01/06/2020   Numbness and tingling of both legs 01/06/2020   Ulcer of esophagus without bleeding 06/20/2019   Physical deconditioning 06/20/2019   Vitamin D deficiency 06/20/2019   Kidney transplanted 03/30/2019   Immunocompromised patient ([place]) 03/30/2019   Chronic illness 03/14/2019   Dementia ([place]) 02/28/2019   Abnormal MRI, lumbar spine 02/28/2019   Pain due to onychomycosis of toenails of both feet 11/18/2018   Tremor 09/23/2018   Allergic rhinitis 09/23/2018   Memory loss 05/25/2018   Valvular heart disease 01/05/2018   History of heart attack 01/05/2018   Chronic back pain 01/05/2018   Elevated alkaline phosphatase level 12/29/2017   Thyroid nodule 08/11/2017   CHF (congestive heart failure) ([place]) 07/26/2017   PAD (peripheral artery disease) ([place]) 03/07/2017   Bilateral carotid artery stenosis 67/05/4579   Complication of vascular access for dialysis 06/19/2016   Atypical chest pain 05/21/2016   Mitral regurgitation 02/06/2016   OSA on CPAP 12/20/2015   Screening for breast cancer 07/27/2015   Dysuria 07/27/2015   Chronic diastolic CHF (congestive heart failure) ([place]) 05/07/2015   S/P coronary artery stent placement    Leg pain 03/05/2015   Neuritis or radiculitis due to rupture of lumbar intervertebral disc 11/28/2014   Routine general medical examination at a health care facility 09/02/2013   Osteoarthritis of right hip 08/23/2013   DDD (degenerative disc disease), lumbosacral 08/23/2013   Right hip pain 08/08/2013   Coronary artery disease of native artery of native heart with stable angina pectoris ([place]) 06/27/2013   Diabetic retinopathy ([place]) 05/28/2013   Hypertension    Hyperlipidemia    Syncope 01/21/2013   End stage renal disease ([place]) 11/08/2012   Essential hypertension 11/08/2012   Diabetic neuropathy ([place]) 02/18/2012   Anemia  in chronic kidney disease (CKD) 02/18/2012   ESRD on hemodialysis ([place]) 08/21/2011   Diabetes mellitus, type II ([place]) 06/30/2011   Stage 3b chronic kidney disease ([place]) 06/30/2011   COPD (chronic obstructive pulmonary disease) ([place]) 06/13/2011   Heart murmur 06/13/2011    Past Surgical History:  Procedure Laterality Date   A/V FISTULAGRAM Left 11/10/2017   Procedure: A/V FISTULAGRAM;  Surgeon: Katha Cabal, MD;  Location: [place] CV [facility];  Service: Cardiovascular;  Laterality: Left;   A/V SHUNTOGRAM Left 02/08/2019   Procedure: A/V SHUNTOGRAM;  Surgeon: Katha Cabal, MD;  Location: [place] [facility];  Service: Cardiovascular;  Laterality: Left;   ABDOMINAL HYSTERECTOMY     2000 for cysts on ovaries total no abnormal pap   CARDIAC CATHETERIZATION     CARDIAC CATHETERIZATION N/A 05/02/2015   Procedure: Left Heart  Cath and Coronary Angiography;  Surgeon: [name], MD;  Location: [place] [facility];  Service: Cardiovascular;  Laterality: N/A;   COLONOSCOPY WITH PROPOFOL N/A 07/08/2016   Procedure: COLONOSCOPY WITH PROPOFOL;  Surgeon: [name], MD;  Location: [facility] ENDOSCOPY;  Service: Endoscopy;  Laterality: N/A;   COLONOSCOPY WITH PROPOFOL N/A 08/20/2017   Procedure: COLONOSCOPY WITH PROPOFOL;  Surgeon: [name], MD;  Location: [facility] ENDOSCOPY;  Service: Gastroenterology;  Laterality: N/A;   colonscopy     CORONARY ANGIOPLASTY  05/28/2014   stent placement to the mid RCA   CYSTOURETHROSCOPY     [facility] with stent placement 11 or 05/2019 and stent removal 06/13/19    DILATION AND CURETTAGE OF UTERUS     several in the early 80's   ESOPHAGOGASTRODUODENOSCOPY     2012   ESOPHAGOGASTRODUODENOSCOPY (EGD) WITH PROPOFOL N/A 03/11/2018   Procedure: ESOPHAGOGASTRODUODENOSCOPY (EGD) WITH PROPOFOL;  Surgeon: [name], MD;  Location: [facility] ENDOSCOPY;  Service: Gastroenterology;  Laterality: N/A;   EYE SURGERY     bilateral laser 2012   EYE SURGERY     right    EYE SURGERY     x4 both eyes   GAS INSERTION  09/30/2011   Procedure: INSERTION OF GAS;  Surgeon: [name], MD;  Location: [place];  Service: Ophthalmology;  Laterality: Right;  [id number]   GAS/FLUID EXCHANGE  09/30/2011   Procedure: GAS/FLUID EXCHANGE;  Surgeon: [name], MD;  Location: [place];  Service: Ophthalmology;  Laterality: Right;   KIDNEY TRANSPLANT     03/30/19 Dr. Mickel Baas Thomas/Dr. Cristie Hem Zendel/Dr. Gilford Raid; [facility] transplant Dr. Horald Chestnut will be primary [phone] coordinator Sarah [place] [phone]   KIDNEY TRANSPLANT     03/30/19 [facility]   LEFT HEART CATHETERIZATION WITH CORONARY ANGIOGRAM N/A 05/28/2013   Procedure: LEFT HEART CATHETERIZATION WITH CORONARY ANGIOGRAM;  Surgeon: [name], MD;  Location: [facility] CATH LAB;  Service: Cardiovascular;  Laterality: N/A;   PARS PLANA VITRECTOMY  04/22/2011   Procedure: PARS PLANA VITRECTOMY WITH 25 GAUGE;  Surgeon: [name], MD;  Location: [place];  Service: Ophthalmology;  Laterality: Left;  membrane peel, endolaser, gas fluid exchange, silicone oil, repair of complex traction retinal detachment   PARS PLANA VITRECTOMY  09/30/2011   Procedure: PARS PLANA VITRECTOMY WITH 25 GAUGE;  Surgeon: [name], MD;  Location: [place];  Service: Ophthalmology;  Laterality: Right;  Endolaser; Repair of Complex Traction Retinal Detachment   PARS PLANA VITRECTOMY  02/24/2012   Procedure: PARS PLANA VITRECTOMY WITH 25 GAUGE;  Surgeon: [name], MD;  Location: [place];  Service: Ophthalmology;  Laterality: Left;   PTCA     SILICON OIL REMOVAL  54/00/8676   Procedure: SILICON OIL REMOVAL;  Surgeon: [name], MD;  Location: [place];  Service: Ophthalmology;  Laterality: Left;   THROMBECTOMY / ARTERIOVENOUS GRAFT REVISION     TUBAL LIGATION     1979   UPPER EXTREMITY ANGIOGRAPHY Left 02/08/2019   Procedure: UPPER EXTREMITY ANGIOGRAPHY;  Surgeon: Katha Cabal, MD;  Location: [place] [facility];  Service:  Cardiovascular;  Laterality: Left;    Prior to Admission medications   Medication Sig Start Date End Date Taking? Authorizing Provider  acetaminophen (TYLENOL) 500 MG tablet Take 1,000 mg by mouth daily as needed. 03/31/19   [provider]  albuterol (PROVENTIL) (2.5 MG/3ML) 0.083% nebulizer solution Take 3 mLs (2.5 mg total) by nebulization every 6 (six) hours  as needed for wheezing or shortness of breath. 12/04/20   McLean-Scocuzza, Nino Glow, MD  albuterol (VENTOLIN HFA) 108 (90 Base) MCG/ACT inhaler Inhale 1-2 puffs into the lungs every 6 (six) hours as needed for wheezing or shortness of breath. 12/04/20   McLean-Scocuzza, Nino Glow, MD  amLODipine (NORVASC) 10 MG tablet Take 1 tablet (10 mg total) by mouth daily. 01/11/21   Minna Merritts, MD  aspirin EC 81 MG tablet Take 81 mg by mouth daily. Swallow whole.    [provider]  atorvastatin (LIPITOR) 20 MG tablet Take 1 tablet (20 mg total) by mouth daily. 01/11/21   Minna Merritts, MD  cloNIDine (CATAPRES) 0.1 MG tablet Take 1 tablet (0.1 mg total) by mouth 2 (two) times daily. 05/18/21   Minna Merritts, MD  ENVARSUS XR 1 MG TB24 Take 1 mg by mouth daily as needed. Total dose is 9 mg every morning at 0900. 01/18/21   [provider]  esomeprazole (NEXIUM) 20 MG capsule Take 20 mg by mouth daily. 04/16/20   [provider]  loratadine (CLARITIN) 10 MG tablet Take 10 mg by mouth daily.    [provider]  magnesium oxide (MAG-OX) 400 MG tablet Take 400 mg by mouth 2 (two) times daily.    [provider]  metFORMIN (GLUCOPHAGE) 500 MG tablet Take by mouth. 05/22/21 05/22/22  [provider]  mupirocin ointment (BACTROBAN) 2 % Apply 1 application topically 2 (two) times daily. Right arm 05/21/21   McLean-Scocuzza, Nino Glow, MD  nitroGLYCERIN (NITROSTAT) 0.4 MG SL tablet Place 1 tablet (0.4 mg total) under the tongue every 5 (five) minutes as needed for chest pain. 04/27/20   Minna Merritts, MD  polyethylene glycol (MIRALAX / GLYCOLAX) 17 g packet Take 17 g by mouth 2 (two) times daily for 14 days. 05/19/21 06/02/21  Nolberto Hanlon, MD  pregabalin (LYRICA) 75 MG capsule Take 1 capsule (75 mg total) by mouth 2 (two) times daily. 12/04/20   McLean-Scocuzza, Nino Glow, MD  senna-docusate (SENOKOT-S) 8.6-50 MG tablet Take 2 tablets by mouth 2 (two) times daily for 10 days. 05/19/21 05/29/21  Nolberto Hanlon, MD  sodium bicarbonate 650 MG tablet Take 650 mg by mouth 2 (two) times daily.    [provider]  tacrolimus ER (ENVARSUS XR) 4 MG TB24 Take 8 mg by mouth daily before breakfast. Total dose is 9 mg daily in the morning.    [provider]  Vitamin D, Ergocalciferol, (DRISDOL) 1.25 MG (50000 UNIT) CAPS capsule Take 50,000 Units by mouth every 7 (seven) days. Mondays    [provider]    Allergies Hydrocodone and Lisinopril  Family History  Problem Relation Age of Onset   Stroke Mother    Heart attack Mother    Heart disease Mother    Colon cancer Father    Colon cancer Sister    Breast cancer Sister    Heart attack Brother    Stroke Brother    Diabetes Brother    Diabetes Brother    Diabetes Daughter    Renal Disease Daughter        on HD   Kidney failure Daughter        age 107 as of 12/03/20   Anesthesia problems Neg Hx    Hypotension Neg Hx    Malignant hyperthermia Neg Hx    Pseudochol deficiency Neg Hx     Social History Social History   Tobacco Use   Smoking  status: Every Day    Packs/day: 1.00    Years: 25.00    Pack years: 25.00    Types: Cigarettes    Last attempt to quit: 06/09/2010    Years since quitting: 10.9   Smokeless tobacco: Never  Vaping Use   Vaping Use: Never used  Substance Use Topics   Alcohol use: No   Drug use: No    Review of Systems  Constitutional: No fever/chills Eyes: No visual changes. ENT: No sore throat. Cardiovascular: Positive for chest pain. Respiratory: Positive for cough and  shortness of breath. Gastrointestinal: No abdominal pain.  No nausea, no vomiting.  No diarrhea.  No constipation.  Positive for bloody stools. Genitourinary: Negative for dysuria. Musculoskeletal: Negative for back pain. Skin: Negative for rash. Neurological: Negative for headaches, focal weakness or numbness.  ____________________________________________   PHYSICAL EXAM:  VITAL SIGNS: ED Triage Vitals  Enc Vitals Group     BP 05/28/21 0336 (!) 179/51     Pulse Rate 05/28/21 0336 70     Resp 05/28/21 0336 18     Temp 05/28/21 0336 98.9 F (37.2 C)     Temp Source 05/28/21 0336 Oral     SpO2 05/28/21 0336 95 %     Weight 05/28/21 0755 160 lb 4.4 oz (72.7 kg)     Height 05/28/21 0755 5\' 7"  (1.702 m)     Head Circumference --      Peak Flow --      Pain Score 05/28/21 0806 0     Pain Loc --      Pain Edu? --      Excl. in [place]? --     Constitutional: Alert and oriented. Eyes: Conjunctivae are normal. Head: Atraumatic. Nose: No congestion/rhinnorhea. Mouth/Throat: Mucous membranes are moist. Neck: Normal ROM Cardiovascular: Normal rate, regular rhythm. Grossly normal heart sounds.  2+ radial pulses bilaterally. Respiratory: Normal respiratory effort.  No retractions. Lungs CTAB. Gastrointestinal: Soft and nontender. No distention. Rectal exam with no bleeding noted, patient with light brown stool that is guaiac negative. Genitourinary: deferred Musculoskeletal: No lower extremity tenderness nor edema. Neurologic:  Normal speech and language. No gross focal neurologic deficits are appreciated. Skin:  Skin is warm, dry and intact. No rash noted. Psychiatric: Mood and affect are normal. Speech and behavior are normal.  ____________________________________________   LABS (all labs ordered are listed, but only abnormal results are displayed)  Labs Reviewed  BASIC METABOLIC PANEL - Abnormal; Notable for the following components:      Result Value   Potassium 5.2 (*)     Glucose, Bld 146 (*)    Creatinine, Ser 1.32 (*)    GFR, Estimated 45 (*)    Anion gap 4 (*)    All other components within normal limits  CBC - Abnormal; Notable for the following components:   RBC 2.68 (*)    Hemoglobin 8.6 (*)    HCT 26.5 (*)    RDW 15.6 (*)    All other components within normal limits  TROPONIN I (HIGH SENSITIVITY) - Abnormal; Notable for the following components:   Troponin I (High Sensitivity) 18 (*)    All other components within normal limits  TROPONIN I (HIGH SENSITIVITY) - Abnormal; Notable for the following components:   Troponin I (High Sensitivity) 20 (*)    All other components within normal limits  RESP PANEL BY RT-PCR (FLU A&B, COVID) [id number]   ____________________________________________  EKG  ED ECG REPORT I, Blake Divine, the attending physician,  personally viewed and interpreted this ECG.   Date: 05/28/2021  EKG Time: 3:40  Rate: 69  Rhythm: normal sinus rhythm  Axis: Normal  Intervals:none  ST&T Change: None   PROCEDURES  Procedure(s) performed (including Critical Care):  Procedures   ____________________________________________   INITIAL IMPRESSION / ASSESSMENT AND PLAN / ED COURSE      66 year old female with past medical history of hypertension, diabetes, renal transplant with subsequent CKD, CAD, CHF, COPD, anemia, and PAD who presents to the ED complaining of acute on chronic shortness of breath developing overnight into this morning.  Patient is not in any respiratory distress and is maintaining O2 sats on room air, noted to maintain O2 sats when ambulating with pulse ox in place.  No wheezing noted but given her cough we will trial DuoNeb.  Chest x-ray reviewed by me and shows possible mild pulmonary edema, however this may also be a chronic finding per radiology, no focal infiltrate noted.  Labs are significant for troponin mildly elevated but stable to patient's previous baseline and stable on recheck.  Her renal  function is stable but she has mild hyperkalemia which will improve with breathing treatment.  Labs also remarkable for acute on chronic anemia with 1.5 point drop from 1 week ago, however no bleeding noted on rectal exam.  Patient states that she had some light rectal bleeding after fecal disimpaction with GI and follow-up bowel regimen, however this has since resolved over the past 2 days.  No concern for significant GI bleed at this time.  Testing for COVID-19 and influenza is negative.  While chest x-ray showed possible pulmonary edema, patient had echocardiogram showing normal ejection fraction within the past week and she does not appear clinically fluid overloaded, suspect this could be a chronic finding on her chest x-ray.  With the chronicity of her symptoms and patient being able to maintain O2 sats while ambulating, she is appropriate for outpatient management with cardiology and nephrology follow-up.  She was counseled to return to the ED for new worsening symptoms, patient agrees with plan.      ____________________________________________   FINAL CLINICAL IMPRESSION(S) / ED DIAGNOSES  Final diagnoses:  Shortness of breath  Anemia, unspecified type     ED Discharge Orders     None        Note:  This document was prepared using Dragon voice recognition software and may include unintentional dictation errors.    Blake Divine, MD 05/28/21 1253    Blake Divine, MD 05/28/21 1254

## 2021-05-28 NOTE — ED Notes (Signed)
Pt presents to ED with c/o of SOB, pt states HX of COPD and emphysema and states D/C'ed about 10 days ago after a 3 day stay due to multiple issues such as hyperglycemia and COPD exacerbation. Pt denies fever or chills. Pt is able to speak in full sentences with NAD. Pt states recently started on new diabetes medications such as metformin and states CBG's have been in the 80's-100's.

## 2021-05-30 MED FILL — ENVARSUS XR 4 MG TABLET,EXTENDED RELEASE: ORAL | 30 days supply | Qty: 60 | Fill #1

## 2021-05-30 MED FILL — ENVARSUS XR 1 MG TABLET,EXTENDED RELEASE: 30 days supply | Qty: 30 | Fill #3

## 2021-05-31 ENCOUNTER — Other Ambulatory Visit: Payer: Self-pay | Admitting: Cardiovascular Disease

## 2021-06-07 ENCOUNTER — Emergency Department: Payer: Medicare Other

## 2021-06-07 ENCOUNTER — Emergency Department
Admission: EM | Admit: 2021-06-07 | Discharge: 2021-06-08 | Disposition: A | Discharge status: Home / Self Care | Payer: Medicare Other | Attending: Emergency Medicine | Admitting: Emergency Medicine

## 2021-06-07 ENCOUNTER — Other Ambulatory Visit: Payer: Self-pay

## 2021-06-07 DIAGNOSIS — R079 Chest pain, unspecified: Secondary | ICD-10-CM | POA: Diagnosis not present

## 2021-06-07 DIAGNOSIS — R202 Paresthesia of skin: Secondary | ICD-10-CM | POA: Insufficient documentation

## 2021-06-07 DIAGNOSIS — R112 Nausea with vomiting, unspecified: Secondary | ICD-10-CM | POA: Diagnosis not present

## 2021-06-07 DIAGNOSIS — Z743 Need for continuous supervision: Secondary | ICD-10-CM | POA: Diagnosis not present

## 2021-06-07 DIAGNOSIS — R0789 Other chest pain: Secondary | ICD-10-CM | POA: Diagnosis not present

## 2021-06-07 DIAGNOSIS — R030 Elevated blood-pressure reading, without diagnosis of hypertension: Secondary | ICD-10-CM | POA: Insufficient documentation

## 2021-06-07 DIAGNOSIS — Z5321 Procedure and treatment not carried out due to patient leaving prior to being seen by health care provider: Secondary | ICD-10-CM | POA: Insufficient documentation

## 2021-06-07 DIAGNOSIS — I499 Cardiac arrhythmia, unspecified: Secondary | ICD-10-CM | POA: Diagnosis not present

## 2021-06-07 DIAGNOSIS — R6889 Other general symptoms and signs: Secondary | ICD-10-CM | POA: Diagnosis not present

## 2021-06-07 LAB — CBC
HCT: 26.3 % — ABNORMAL LOW (ref 36.0–46.0)
Hemoglobin: 8.9 g/dL — ABNORMAL LOW (ref 12.0–15.0)
MCH: 31.8 pg (ref 26.0–34.0)
MCHC: 33.8 g/dL (ref 30.0–36.0)
MCV: 93.9 fL (ref 80.0–100.0)
Platelets: 178 10*3/uL (ref 150–400)
RBC: 2.8 MIL/uL — ABNORMAL LOW (ref 3.87–5.11)
RDW: 15.9 % — ABNORMAL HIGH (ref 11.5–15.5)
WBC: 6.5 10*3/uL (ref 4.0–10.5)
nRBC: 0 % (ref 0.0–0.2)

## 2021-06-07 LAB — BASIC METABOLIC PANEL
Anion gap: 6 (ref 5–15)
BUN: 14 mg/dL (ref 8–23)
CO2: 20 mmol/L — ABNORMAL LOW (ref 22–32)
Calcium: 8.6 mg/dL — ABNORMAL LOW (ref 8.9–10.3)
Chloride: 103 mmol/L (ref 98–111)
Creatinine, Ser: 1.45 mg/dL — ABNORMAL HIGH (ref 0.44–1.00)
GFR, Estimated: 41 mL/min — ABNORMAL LOW (ref >60)
Glucose, Bld: 84 mg/dL (ref 70–99)
Potassium: 5 mmol/L (ref 3.5–5.1)
Sodium: 129 mmol/L — ABNORMAL LOW (ref 135–145)

## 2021-06-07 LAB — TROPONIN I (HIGH SENSITIVITY)
Troponin I (High Sensitivity): 23 ng/L — ABNORMAL HIGH (ref <18)
Troponin I (High Sensitivity): 24 ng/L — ABNORMAL HIGH (ref <18)

## 2021-06-07 NOTE — ED Triage Notes (Signed)
Pt comes with c/o CP that started at 3am today. Pt states N/V and left arm tingling. Pt given 4 aspirin and 2 nitro. Pt now states pain at 6/10 after nitros.  BP at first 220/70 and now 170/80 100% RA CBG-102  22 right hand.

## 2021-06-07 NOTE — ED Notes (Signed)
Pt called this writer over to her in the lobby and stated "take this blood pressure cuff off of me and take out this IV.  Im going home."  Pts son is with her and will take her home.  Pt advised to return if sx worsen or change.  IV removed from rt hand intact, bleeding controlled.

## 2021-06-11 ENCOUNTER — Other Ambulatory Visit: Payer: Self-pay

## 2021-06-11 DIAGNOSIS — Z940 Kidney transplant status: Secondary | ICD-10-CM | POA: Diagnosis not present

## 2021-06-11 MED ORDER — CLONIDINE HCL 0.1 MG PO TABS: 0.2000 mg | ORAL_TABLET | Freq: Two times a day (BID) | ORAL | 3 refills | Status: DC

## 2021-06-11 NOTE — Telephone Encounter (Signed)
Please advise the correct instructions for patients clonidine prescription.   Pharmacy is requesting : Clonidine 0.1MG  - take 1 tablet by mouth twice a day,   Per Dr, Gwenyth Ober last office note 01/2021 : "Recommend she add extra clonidine 0.1 mg at middle of the day in addition to her newly increase clonidine 0.2 twice daily"

## 2021-06-11 NOTE — Telephone Encounter (Signed)
*  STAT* If patient is at the pharmacy, call can be transferred to refill team.   1. Which medications need to be refilled? (please list name of each medication and dose if known) Clonidine  2. Which pharmacy/location (including street and city if local pharmacy) is medication to be sent to? [place]  3. Do they need a 30 day or 90 day supply? [place]

## 2021-06-13 MED ORDER — ERGOCALCIFEROL (VITAMIN D2) 1,250 MCG (50,000 UNIT) CAPSULE
ORAL_CAPSULE | ORAL | 2 refills | 28 days
Start: 2021-06-13 — End: 2022-06-13

## 2021-06-14 MED ORDER — ERGOCALCIFEROL (VITAMIN D2) 1,250 MCG (50,000 UNIT) CAPSULE
ORAL_CAPSULE | ORAL | 2 refills | 28 days | Status: CP
Start: 2021-06-14 — End: 2022-06-14
  Filled 2021-06-14: qty 4, 28d supply, fill #0

## 2021-06-14 MED FILL — MAGNESIUM OXIDE 400 MG (241.3 MG MAGNESIUM) TABLET: ORAL | 60 days supply | Qty: 120 | Fill #5

## 2021-06-15 ENCOUNTER — Ambulatory Visit
Admit: 2021-06-15 | Discharge: 2021-06-28 | Disposition: A | Discharge status: Home Health | Payer: MEDICARE | Admitting: Student in an Organized Health Care Education/Training Program

## 2021-06-15 ENCOUNTER — Ambulatory Visit: Admit: 2021-06-15 | Payer: MEDICARE

## 2021-06-15 DIAGNOSIS — Z79899 Other long term (current) drug therapy: Secondary | ICD-10-CM | POA: Diagnosis not present

## 2021-06-15 DIAGNOSIS — R918 Other nonspecific abnormal finding of lung field: Secondary | ICD-10-CM | POA: Diagnosis not present

## 2021-06-15 DIAGNOSIS — Z20822 Contact with and (suspected) exposure to covid-19: Secondary | ICD-10-CM | POA: Diagnosis not present

## 2021-06-15 DIAGNOSIS — Z794 Long term (current) use of insulin: Secondary | ICD-10-CM | POA: Diagnosis not present

## 2021-06-15 DIAGNOSIS — J1282 Pneumonia due to coronavirus disease 2019: Secondary | ICD-10-CM | POA: Diagnosis not present

## 2021-06-15 DIAGNOSIS — R9431 Abnormal electrocardiogram [ECG] [EKG]: Secondary | ICD-10-CM | POA: Diagnosis not present

## 2021-06-15 DIAGNOSIS — D539 Nutritional anemia, unspecified: Secondary | ICD-10-CM | POA: Diagnosis not present

## 2021-06-15 DIAGNOSIS — I509 Heart failure, unspecified: Secondary | ICD-10-CM | POA: Diagnosis not present

## 2021-06-15 DIAGNOSIS — J9601 Acute respiratory failure with hypoxia: Secondary | ICD-10-CM | POA: Diagnosis not present

## 2021-06-15 DIAGNOSIS — I10 Essential (primary) hypertension: Secondary | ICD-10-CM | POA: Diagnosis not present

## 2021-06-15 DIAGNOSIS — Z940 Kidney transplant status: Secondary | ICD-10-CM | POA: Diagnosis not present

## 2021-06-15 DIAGNOSIS — J90 Pleural effusion, not elsewhere classified: Secondary | ICD-10-CM | POA: Diagnosis not present

## 2021-06-15 DIAGNOSIS — R072 Precordial pain: Secondary | ICD-10-CM | POA: Diagnosis not present

## 2021-06-15 DIAGNOSIS — E785 Hyperlipidemia, unspecified: Secondary | ICD-10-CM | POA: Diagnosis not present

## 2021-06-15 DIAGNOSIS — I132 Hypertensive heart and chronic kidney disease with heart failure and with stage 5 chronic kidney disease, or end stage renal disease: Secondary | ICD-10-CM | POA: Diagnosis not present

## 2021-06-15 DIAGNOSIS — R079 Chest pain, unspecified: Secondary | ICD-10-CM | POA: Diagnosis not present

## 2021-06-15 DIAGNOSIS — D638 Anemia in other chronic diseases classified elsewhere: Secondary | ICD-10-CM | POA: Diagnosis not present

## 2021-06-15 DIAGNOSIS — I080 Rheumatic disorders of both mitral and aortic valves: Secondary | ICD-10-CM | POA: Diagnosis not present

## 2021-06-15 DIAGNOSIS — N186 End stage renal disease: Secondary | ICD-10-CM | POA: Diagnosis not present

## 2021-06-15 DIAGNOSIS — R519 Headache, unspecified: Secondary | ICD-10-CM | POA: Diagnosis not present

## 2021-06-15 DIAGNOSIS — R0989 Other specified symptoms and signs involving the circulatory and respiratory systems: Secondary | ICD-10-CM | POA: Diagnosis not present

## 2021-06-15 DIAGNOSIS — Z87891 Personal history of nicotine dependence: Secondary | ICD-10-CM | POA: Diagnosis not present

## 2021-06-15 DIAGNOSIS — E875 Hyperkalemia: Secondary | ICD-10-CM | POA: Diagnosis not present

## 2021-06-15 DIAGNOSIS — I2510 Atherosclerotic heart disease of native coronary artery without angina pectoris: Secondary | ICD-10-CM | POA: Diagnosis not present

## 2021-06-15 DIAGNOSIS — R0602 Shortness of breath: Secondary | ICD-10-CM | POA: Diagnosis not present

## 2021-06-15 DIAGNOSIS — J811 Chronic pulmonary edema: Secondary | ICD-10-CM | POA: Diagnosis not present

## 2021-06-15 DIAGNOSIS — N390 Urinary tract infection, site not specified: Secondary | ICD-10-CM | POA: Diagnosis not present

## 2021-06-15 DIAGNOSIS — I110 Hypertensive heart disease with heart failure: Secondary | ICD-10-CM | POA: Diagnosis not present

## 2021-06-15 DIAGNOSIS — R339 Retention of urine, unspecified: Secondary | ICD-10-CM | POA: Diagnosis not present

## 2021-06-15 DIAGNOSIS — R0789 Other chest pain: Secondary | ICD-10-CM | POA: Diagnosis not present

## 2021-06-15 DIAGNOSIS — E1121 Type 2 diabetes mellitus with diabetic nephropathy: Secondary | ICD-10-CM | POA: Diagnosis not present

## 2021-06-15 DIAGNOSIS — D649 Anemia, unspecified: Secondary | ICD-10-CM | POA: Diagnosis not present

## 2021-06-15 DIAGNOSIS — R202 Paresthesia of skin: Secondary | ICD-10-CM | POA: Diagnosis not present

## 2021-06-15 DIAGNOSIS — D849 Immunodeficiency, unspecified: Secondary | ICD-10-CM | POA: Diagnosis not present

## 2021-06-15 DIAGNOSIS — J440 Chronic obstructive pulmonary disease with acute lower respiratory infection: Secondary | ICD-10-CM | POA: Diagnosis not present

## 2021-06-15 DIAGNOSIS — E119 Type 2 diabetes mellitus without complications: Secondary | ICD-10-CM | POA: Diagnosis not present

## 2021-06-15 DIAGNOSIS — R509 Fever, unspecified: Secondary | ICD-10-CM | POA: Diagnosis not present

## 2021-06-15 DIAGNOSIS — I272 Pulmonary hypertension, unspecified: Secondary | ICD-10-CM | POA: Diagnosis not present

## 2021-06-15 DIAGNOSIS — U071 COVID-19: Secondary | ICD-10-CM | POA: Diagnosis not present

## 2021-06-15 DIAGNOSIS — I5033 Acute on chronic diastolic (congestive) heart failure: Secondary | ICD-10-CM | POA: Diagnosis not present

## 2021-06-16 DIAGNOSIS — I5033 Acute on chronic diastolic (congestive) heart failure: Secondary | ICD-10-CM | POA: Diagnosis not present

## 2021-06-17 DIAGNOSIS — I080 Rheumatic disorders of both mitral and aortic valves: Secondary | ICD-10-CM | POA: Diagnosis not present

## 2021-06-17 DIAGNOSIS — I5033 Acute on chronic diastolic (congestive) heart failure: Secondary | ICD-10-CM | POA: Diagnosis not present

## 2021-06-17 DIAGNOSIS — I272 Pulmonary hypertension, unspecified: Secondary | ICD-10-CM | POA: Diagnosis not present

## 2021-06-17 DIAGNOSIS — R0789 Other chest pain: Secondary | ICD-10-CM | POA: Diagnosis not present

## 2021-06-17 DIAGNOSIS — I509 Heart failure, unspecified: Secondary | ICD-10-CM | POA: Diagnosis not present

## 2021-06-18 ENCOUNTER — Ambulatory Visit: Payer: Medicare Other | Admitting: Cardiovascular Disease

## 2021-06-18 DIAGNOSIS — I5033 Acute on chronic diastolic (congestive) heart failure: Secondary | ICD-10-CM | POA: Diagnosis not present

## 2021-06-19 DIAGNOSIS — R079 Chest pain, unspecified: Secondary | ICD-10-CM | POA: Diagnosis not present

## 2021-06-19 DIAGNOSIS — J90 Pleural effusion, not elsewhere classified: Secondary | ICD-10-CM | POA: Diagnosis not present

## 2021-06-19 DIAGNOSIS — I5033 Acute on chronic diastolic (congestive) heart failure: Secondary | ICD-10-CM | POA: Diagnosis not present

## 2021-06-19 DIAGNOSIS — J811 Chronic pulmonary edema: Secondary | ICD-10-CM | POA: Diagnosis not present

## 2021-06-20 DIAGNOSIS — I5033 Acute on chronic diastolic (congestive) heart failure: Secondary | ICD-10-CM | POA: Diagnosis not present

## 2021-06-24 ENCOUNTER — Ambulatory Visit: Payer: Medicare Other | Admitting: Gastroenterology

## 2021-06-25 DIAGNOSIS — Z940 Kidney transplant status: Secondary | ICD-10-CM | POA: Diagnosis not present

## 2021-06-25 DIAGNOSIS — E875 Hyperkalemia: Secondary | ICD-10-CM | POA: Diagnosis not present

## 2021-06-25 DIAGNOSIS — Z79899 Other long term (current) drug therapy: Secondary | ICD-10-CM | POA: Diagnosis not present

## 2021-06-25 DIAGNOSIS — D849 Immunodeficiency, unspecified: Secondary | ICD-10-CM | POA: Diagnosis not present

## 2021-06-25 DIAGNOSIS — R339 Retention of urine, unspecified: Secondary | ICD-10-CM | POA: Diagnosis not present

## 2021-06-26 ENCOUNTER — Ambulatory Visit: Admit: 2021-06-26 | Payer: MEDICARE

## 2021-06-26 DIAGNOSIS — R079 Chest pain, unspecified: Secondary | ICD-10-CM | POA: Diagnosis not present

## 2021-06-26 DIAGNOSIS — R519 Headache, unspecified: Secondary | ICD-10-CM | POA: Diagnosis not present

## 2021-06-26 DIAGNOSIS — N390 Urinary tract infection, site not specified: Secondary | ICD-10-CM | POA: Diagnosis not present

## 2021-06-26 DIAGNOSIS — I509 Heart failure, unspecified: Secondary | ICD-10-CM | POA: Diagnosis not present

## 2021-06-26 DIAGNOSIS — U071 COVID-19: Secondary | ICD-10-CM | POA: Diagnosis not present

## 2021-06-26 DIAGNOSIS — R339 Retention of urine, unspecified: Secondary | ICD-10-CM | POA: Diagnosis not present

## 2021-06-26 DIAGNOSIS — D539 Nutritional anemia, unspecified: Secondary | ICD-10-CM | POA: Diagnosis not present

## 2021-06-26 DIAGNOSIS — E875 Hyperkalemia: Secondary | ICD-10-CM | POA: Diagnosis not present

## 2021-06-26 DIAGNOSIS — I110 Hypertensive heart disease with heart failure: Secondary | ICD-10-CM | POA: Diagnosis not present

## 2021-06-27 DIAGNOSIS — R079 Chest pain, unspecified: Secondary | ICD-10-CM | POA: Diagnosis not present

## 2021-06-27 DIAGNOSIS — D539 Nutritional anemia, unspecified: Secondary | ICD-10-CM | POA: Diagnosis not present

## 2021-06-27 DIAGNOSIS — E875 Hyperkalemia: Secondary | ICD-10-CM | POA: Diagnosis not present

## 2021-06-27 DIAGNOSIS — R339 Retention of urine, unspecified: Secondary | ICD-10-CM | POA: Diagnosis not present

## 2021-06-27 DIAGNOSIS — Z79899 Other long term (current) drug therapy: Secondary | ICD-10-CM | POA: Diagnosis not present

## 2021-06-27 DIAGNOSIS — N390 Urinary tract infection, site not specified: Secondary | ICD-10-CM | POA: Diagnosis not present

## 2021-06-27 DIAGNOSIS — Z940 Kidney transplant status: Secondary | ICD-10-CM | POA: Diagnosis not present

## 2021-06-27 DIAGNOSIS — D849 Immunodeficiency, unspecified: Secondary | ICD-10-CM | POA: Diagnosis not present

## 2021-06-28 ENCOUNTER — Telehealth: Payer: Self-pay | Admitting: Internal Medicine

## 2021-06-28 MED ORDER — TACROLIMUS XR 1 MG TABLET,EXTENDED RELEASE 24 HR
ORAL_TABLET | ORAL | 11 refills | 0.00000 days | Status: CP
Start: 2021-06-28 — End: ?

## 2021-06-28 MED ORDER — AMLODIPINE 10 MG TABLET
ORAL_TABLET | Freq: Every day | ORAL | 1 refills | 30.00000 days | Status: CP
Start: 2021-06-28 — End: ?
  Filled 2021-06-28: qty 30, 30d supply, fill #0

## 2021-06-28 MED ORDER — SODIUM ZIRCONIUM CYCLOSILICATE 10 GRAM ORAL POWDER PACKET
PACK | Freq: Every day | ORAL | 0 refills | 14 days | Status: CP
Start: 2021-06-28 — End: 2021-07-12

## 2021-06-28 MED ORDER — FUROSEMIDE 40 MG TABLET
ORAL_TABLET | Freq: Two times a day (BID) | ORAL | 0 refills | 30 days | Status: CP
Start: 2021-06-28 — End: ?
  Filled 2021-06-28: qty 60, 30d supply, fill #0

## 2021-06-28 MED ORDER — TACROLIMUS XR 4 MG TABLET,EXTENDED RELEASE 24 HR
ORAL_TABLET | Freq: Every day | ORAL | 11 refills | 30 days | Status: CP
Start: 2021-06-28 — End: ?

## 2021-06-28 MED ORDER — CEFDINIR 300 MG CAPSULE
ORAL_CAPSULE | Freq: Two times a day (BID) | ORAL | 0 refills | 4 days | Status: CP
Start: 2021-06-28 — End: 2021-07-02
  Filled 2021-06-28: qty 8, 4d supply, fill #0

## 2021-06-28 MED FILL — LOKELMA 10 GRAM ORAL POWDER PACKET: ORAL | 14 days supply | Qty: 14 | Fill #0

## 2021-06-28 NOTE — Telephone Encounter (Signed)
Noted, will await orders

## 2021-06-28 NOTE — Telephone Encounter (Signed)
Manuela Schwartz from [facility] is calling in regards to pt. She will be discharged soon and Manuela Schwartz is wanting to make [place] aware pt needs home health orders signed. Manuela Schwartz will be faxing over orders.

## 2021-06-28 NOTE — Telephone Encounter (Signed)
This will be ok.  

## 2021-07-02 ENCOUNTER — Encounter: Admit: 2021-07-02 | Payer: MEDICARE

## 2021-07-02 ENCOUNTER — Inpatient Hospital Stay: Admit: 2021-06-29 | Payer: MEDICARE

## 2021-07-02 ENCOUNTER — Encounter: Admit: 2021-07-02 | Discharge: 2021-07-31 | Payer: MEDICARE

## 2021-07-02 DIAGNOSIS — N1832 Chronic kidney disease, stage 3b: Secondary | ICD-10-CM | POA: Diagnosis not present

## 2021-07-02 DIAGNOSIS — Z794 Long term (current) use of insulin: Secondary | ICD-10-CM | POA: Diagnosis not present

## 2021-07-02 DIAGNOSIS — J449 Chronic obstructive pulmonary disease, unspecified: Secondary | ICD-10-CM | POA: Diagnosis not present

## 2021-07-02 DIAGNOSIS — D84821 Immunodeficiency due to drugs: Secondary | ICD-10-CM | POA: Diagnosis not present

## 2021-07-02 DIAGNOSIS — E11319 Type 2 diabetes mellitus with unspecified diabetic retinopathy without macular edema: Secondary | ICD-10-CM | POA: Diagnosis not present

## 2021-07-02 DIAGNOSIS — G8929 Other chronic pain: Secondary | ICD-10-CM | POA: Diagnosis not present

## 2021-07-02 DIAGNOSIS — I080 Rheumatic disorders of both mitral and aortic valves: Secondary | ICD-10-CM | POA: Diagnosis not present

## 2021-07-02 DIAGNOSIS — I509 Heart failure, unspecified: Secondary | ICD-10-CM | POA: Diagnosis not present

## 2021-07-02 DIAGNOSIS — J309 Allergic rhinitis, unspecified: Secondary | ICD-10-CM | POA: Diagnosis not present

## 2021-07-02 DIAGNOSIS — I272 Pulmonary hypertension, unspecified: Secondary | ICD-10-CM | POA: Diagnosis not present

## 2021-07-02 DIAGNOSIS — E1140 Type 2 diabetes mellitus with diabetic neuropathy, unspecified: Secondary | ICD-10-CM | POA: Diagnosis not present

## 2021-07-02 DIAGNOSIS — E1122 Type 2 diabetes mellitus with diabetic chronic kidney disease: Secondary | ICD-10-CM | POA: Diagnosis not present

## 2021-07-02 DIAGNOSIS — Z79621 Long term (current) use of calcineurin inhibitor: Secondary | ICD-10-CM | POA: Diagnosis not present

## 2021-07-02 DIAGNOSIS — I5033 Acute on chronic diastolic (congestive) heart failure: Secondary | ICD-10-CM | POA: Diagnosis not present

## 2021-07-02 DIAGNOSIS — E875 Hyperkalemia: Secondary | ICD-10-CM | POA: Diagnosis not present

## 2021-07-02 DIAGNOSIS — G4733 Obstructive sleep apnea (adult) (pediatric): Secondary | ICD-10-CM | POA: Diagnosis not present

## 2021-07-02 DIAGNOSIS — D631 Anemia in chronic kidney disease: Secondary | ICD-10-CM | POA: Diagnosis not present

## 2021-07-02 DIAGNOSIS — I2510 Atherosclerotic heart disease of native coronary artery without angina pectoris: Secondary | ICD-10-CM | POA: Diagnosis not present

## 2021-07-02 DIAGNOSIS — M549 Dorsalgia, unspecified: Secondary | ICD-10-CM | POA: Diagnosis not present

## 2021-07-02 DIAGNOSIS — U071 COVID-19: Secondary | ICD-10-CM | POA: Diagnosis not present

## 2021-07-02 DIAGNOSIS — E1151 Type 2 diabetes mellitus with diabetic peripheral angiopathy without gangrene: Secondary | ICD-10-CM | POA: Diagnosis not present

## 2021-07-02 DIAGNOSIS — I6523 Occlusion and stenosis of bilateral carotid arteries: Secondary | ICD-10-CM | POA: Diagnosis not present

## 2021-07-02 DIAGNOSIS — I130 Hypertensive heart and chronic kidney disease with heart failure and stage 1 through stage 4 chronic kidney disease, or unspecified chronic kidney disease: Secondary | ICD-10-CM | POA: Diagnosis not present

## 2021-07-02 DIAGNOSIS — Z940 Kidney transplant status: Secondary | ICD-10-CM | POA: Diagnosis not present

## 2021-07-02 DIAGNOSIS — K219 Gastro-esophageal reflux disease without esophagitis: Secondary | ICD-10-CM | POA: Diagnosis not present

## 2021-07-02 DIAGNOSIS — E785 Hyperlipidemia, unspecified: Secondary | ICD-10-CM | POA: Diagnosis not present

## 2021-07-02 DIAGNOSIS — Z7984 Long term (current) use of oral hypoglycemic drugs: Secondary | ICD-10-CM | POA: Diagnosis not present

## 2021-07-03 ENCOUNTER — Ambulatory Visit (INDEPENDENT_AMBULATORY_CARE_PROVIDER_SITE_OTHER): Payer: Medicare Other | Admitting: Internal Medicine

## 2021-07-03 ENCOUNTER — Other Ambulatory Visit: Payer: Self-pay

## 2021-07-03 ENCOUNTER — Inpatient Hospital Stay: Payer: Medicare Other | Admitting: Internal Medicine

## 2021-07-03 ENCOUNTER — Encounter: Payer: Self-pay | Admitting: Internal Medicine

## 2021-07-03 VITALS — BP 109/66 | Ht 67.5 in | Wt 172.0 lb

## 2021-07-03 DIAGNOSIS — R5383 Other fatigue: Secondary | ICD-10-CM | POA: Diagnosis not present

## 2021-07-03 DIAGNOSIS — N3000 Acute cystitis without hematuria: Secondary | ICD-10-CM

## 2021-07-03 DIAGNOSIS — J321 Chronic frontal sinusitis: Secondary | ICD-10-CM

## 2021-07-03 DIAGNOSIS — I38 Endocarditis, valve unspecified: Secondary | ICD-10-CM | POA: Diagnosis not present

## 2021-07-03 DIAGNOSIS — I509 Heart failure, unspecified: Secondary | ICD-10-CM

## 2021-07-03 DIAGNOSIS — U071 COVID-19: Secondary | ICD-10-CM

## 2021-07-03 DIAGNOSIS — N6310 Unspecified lump in the right breast, unspecified quadrant: Secondary | ICD-10-CM | POA: Diagnosis not present

## 2021-07-03 DIAGNOSIS — D649 Anemia, unspecified: Secondary | ICD-10-CM | POA: Diagnosis not present

## 2021-07-03 MED ORDER — FERROUS SULFATE 325 MG (65 MG IRON) TABLET,DELAYED RELEASE
ORAL_TABLET | ORAL | 11 refills | 30 days | Status: CP
Start: 2021-07-03 — End: 2022-07-03

## 2021-07-03 MED ORDER — AZITHROMYCIN 250 MG PO TABS: ORAL_TABLET | ORAL | 0 refills | Status: AC

## 2021-07-03 MED ORDER — SALINE SPRAY 0.65 % NA SOLN
2.0000 | NASAL | 2 refills | Status: DC | PRN
Start: 2021-07-03 — End: 2022-05-03

## 2021-07-03 NOTE — Progress Notes (Signed)
Telephone Note I connected with Theresa [name]  on 07/03/21 at 10:00 AM EST by telephone and verified that I am speaking with the correct person using two identifiers.  Location patient: [place] Location provider:work or home office Persons participating in the virtual visit: patient, provider  I discussed the limitations and requested verbal permission for telemedicine visit. The patient expressed understanding and agreed to proceed.   HPI:  Acute telemedicine visit for : HFU [facility] in hospital 06/15/21 to 06/28/21 chest pain and acute on chronic CHF with pulmonary edema b/l with effusions and trace pericardiac effusion echo 06/17/21 ef 60-65% with mild to moderate multiple valve abnormalities. She had hypotension in the hospital as well She also had UTI E coli, klebsiella on cefdinir and she contracted covid in the hospital given remdesivir and having nasal congestion and nose bleeds denies sore in nose   Also had PET scan which showed 1.4 cm mass in right breast and rec f/u pt is due for b/l diagnostic mammogram and will do b/l [place]    S/p covid tested now negative but having sinus pain and pressure   H/o HTN with recent Hypotension f/u h/h to house yesterday and BP 109/56 on clonidine 0.1 mg (1-2) bid and lasix 40 mg bid and norvasc 10 mg qd  K issues given Lokelma bid at discharge will have pt f/u [facility] as well   Hh has been to house [facility] [place] [facility] [phone]   -Pertinent past medical history: see below -Pertinent medication allergies: Allergies  Allergen Reactions   Hydrocodone Hives   Lisinopril     Unknown reaction    -COVID-19 vaccine status:  Immunization History  Administered Date(s) Administered   Hepatitis B 12/16/2012, 01/15/2013, 03/05/2013, 06/18/2013   Hepatitis B, adult 12/16/2012, 01/15/2013, 03/05/2013, 06/18/2013   Influenza Nasal 03/06/2015   Influenza Split 02/25/2012, 02/07/2013, 02/19/2015, 03/06/2015   Influenza, High Dose  Seasonal PF 02/09/2018   Influenza,inj,Quad PF,6+ Mos 02/21/2014, 02/24/2017, 04/04/2020, 04/11/2021   Influenza-Unspecified 02/25/2012, 03/01/2018, 03/18/2019, 04/04/2020   PFIZER(Purple Top)SARS-COV-2 Vaccination 02/11/2020, 03/03/2020, 09/12/2020, 01/10/2021   PPD Test 07/16/2012, 11/12/2012, 01/17/2015, 05/12/2016, 05/13/2017, 05/12/2018   Pfizer Covid-19 Vaccine Bivalent Booster 48yrs & up 04/26/2021   Pneumococcal Conjugate-13 05/15/2020   Pneumococcal Polysaccharide-23 06/09/2010, 03/23/2018   Pneumococcal-Unspecified 06/09/2010, 07/04/2015, 03/23/2018   Tetanus 06/09/2010     ROS: See pertinent positives and negatives per HPI.  Past Medical History:  Diagnosis Date   Anemia of chronic disease    Anginal pain ([place])    Carotid arterial disease ([place])    a. 02/2013 U/S: 40-59% bilat ICA stenosis.   Chronic constipation    Chronic diastolic CHF (congestive heart failure) ([place])    a. 10/2013 Echo [facility]): EF 55-60%, mod conc LVH, mod MR, mildly dil LA, mild Ao sclerosis w/o stenosis; b. 02/2016 Echo: EF 55-60%, no rwma, Gr DD, mild AS, mod to sev MR, mildly dil LA, PASP 10mmHg; c. 07/2017 Echo [facility]): EF >55%, mild to mod LVH, Gr2 DD, Ao scl, Mod MR, mod dil LA, nl RV fxn.   Colon polyps    COPD (chronic obstructive pulmonary disease) (HCC)    Coronary artery disease    a. 05/2013 NSTEMI/PCI: LM 20d, LAD min irregs, LCX small, nl, OM1 nl, RCA dom 66m (2.5x16 Promus DES), PDA1 80p; b. 04/2015 Cath: LM nl, LAD 67m, D1/2 min irregs, LCX 35p/m, OM2/3 min irregs, RCA patent mid stent, RPDA 70ost, RPLB1 30, RPLB2/3 min irregs, EF 55-65%--> Med Rx; c.  06/2017 [facility]): No ischemia/infarct, EF 64%.   COVID-19    06/2021   Diabetes mellitus    Diabetic neuropathy ([place])    Diabetic retinopathy ([place]) 05/28/2013   Hx bilat retinal detachment, proliferative diab retinopathy and bilat vitreous hemorrhage    Emphysema    ESRD on hemodialysis ([place])    a. [facility] in [place], [place] [phone]/Dr.  [name], on a MWF schedule.  She started dialysis in Feb 2014.  Etiology of renal failure not known, likely diabetes.  Has a left upper arm AV graft.   History of bronchitis    Mar 2012   History of pneumonia    June 2012   History of tobacco abuse    a. Quit 2012.   Hyperlipidemia    Hypertension    Moderate to severe mitral insufficiency    a. 10/2013 Echo: EF 55-60%, mod MR; b. 02/2016 Echo: EF 55-60%, mod to sev MR directed posteriorly--felt to be dynamic -worse with volume overload; c. 07/2017 Echo [facility]): EF >55%. Mod MR.   Myocardial infarct [facility]) 05/2013   Peripheral vascular disease ([place])    Sickle cell trait ([place])    Thyroid nodule    [place] 06/2017 due to f/u US in 1 year    Valvular heart disease    echo 06/17/21 mild to moderate    Past Surgical History:  Procedure Laterality Date   A/V FISTULAGRAM Left 11/10/2017   Procedure: A/V FISTULAGRAM;  Surgeon: Katha Cabal, MD;  Location: [place] CV [facility];  Service: Cardiovascular;  Laterality: Left;   A/V SHUNTOGRAM Left 02/08/2019   Procedure: A/V SHUNTOGRAM;  Surgeon: Katha Cabal, MD;  Location: [place] [facility];  Service: Cardiovascular;  Laterality: Left;   ABDOMINAL HYSTERECTOMY     2000 for cysts on ovaries total no abnormal pap   CARDIAC CATHETERIZATION     CARDIAC CATHETERIZATION N/A 05/02/2015   Procedure: Left Heart Cath and Coronary Angiography;  Surgeon: [name], MD;  Location: [place] [facility];  Service: Cardiovascular;  Laterality: N/A;   COLONOSCOPY WITH PROPOFOL N/A 07/08/2016   Procedure: COLONOSCOPY WITH PROPOFOL;  Surgeon: [name], MD;  Location: [facility] ENDOSCOPY;  Service: Endoscopy;  Laterality: N/A;   COLONOSCOPY WITH PROPOFOL N/A 08/20/2017   Procedure: COLONOSCOPY WITH PROPOFOL;  Surgeon: [name], MD;  Location: [facility] ENDOSCOPY;  Service: Gastroenterology;  Laterality: N/A;   colonscopy     CORONARY ANGIOPLASTY  05/28/2014   stent placement to the mid RCA    CYSTOURETHROSCOPY     [facility] urology with stent placement 11 or 05/2019 and stent removal 06/13/19    DILATION AND CURETTAGE OF UTERUS     several in the early 80's   ESOPHAGOGASTRODUODENOSCOPY     2012   ESOPHAGOGASTRODUODENOSCOPY (EGD) WITH PROPOFOL N/A 03/11/2018   Procedure: ESOPHAGOGASTRODUODENOSCOPY (EGD) WITH PROPOFOL;  Surgeon: [name], MD;  Location: [facility] ENDOSCOPY;  Service: Gastroenterology;  Laterality: N/A;   EYE SURGERY     bilateral laser 2012   EYE SURGERY     right   EYE SURGERY     x4 both eyes   GAS INSERTION  09/30/2011   Procedure: INSERTION OF GAS;  Surgeon: [name], MD;  Location: [place];  Service: Ophthalmology;  Laterality: Right;  [id number]   GAS/FLUID EXCHANGE  09/30/2011   Procedure: GAS/FLUID EXCHANGE;  Surgeon: [name], MD;  Location: [place];  Service: Ophthalmology;  Laterality: Right;   KIDNEY TRANSPLANT     03/30/19 Dr.  Maudry Mayhew Zendel/Dr. Gilford Raid; [facility] transplant Dr. Horald Chestnut will be primary [phone] coordinator Wandra Arthurs [phone]   KIDNEY TRANSPLANT     03/30/19 [facility]   LEFT HEART CATHETERIZATION WITH CORONARY ANGIOGRAM N/A 05/28/2013   Procedure: LEFT HEART CATHETERIZATION WITH CORONARY ANGIOGRAM;  Surgeon: [name], MD;  Location: [facility] CATH LAB;  Service: Cardiovascular;  Laterality: N/A;   PARS PLANA VITRECTOMY  04/22/2011   Procedure: PARS PLANA VITRECTOMY WITH 25 GAUGE;  Surgeon: [name], MD;  Location: [place];  Service: Ophthalmology;  Laterality: Left;  membrane peel, endolaser, gas fluid exchange, silicone oil, repair of complex traction retinal detachment   PARS PLANA VITRECTOMY  09/30/2011   Procedure: PARS PLANA VITRECTOMY WITH 25 GAUGE;  Surgeon: [name], MD;  Location: [place];  Service: Ophthalmology;  Laterality: Right;  Endolaser; Repair of Complex Traction Retinal Detachment   PARS PLANA VITRECTOMY  02/24/2012   Procedure: PARS PLANA VITRECTOMY WITH 25 GAUGE;  Surgeon: [name], MD;  Location: [place];  Service: Ophthalmology;  Laterality: Left;   PTCA     SILICON OIL REMOVAL  42/70/6237   Procedure: SILICON OIL REMOVAL;  Surgeon: [name], MD;  Location: [place];  Service: Ophthalmology;  Laterality: Left;   THROMBECTOMY / ARTERIOVENOUS GRAFT REVISION     TUBAL LIGATION     1979   UPPER EXTREMITY ANGIOGRAPHY Left 02/08/2019   Procedure: UPPER EXTREMITY ANGIOGRAPHY;  Surgeon: Katha Cabal, MD;  Location: [place] [facility];  Service: Cardiovascular;  Laterality: Left;     Current Outpatient Medications:    acetaminophen (TYLENOL) 500 MG tablet, Take 1,000 mg by mouth daily as needed., Disp: , Rfl:    albuterol (PROVENTIL) (2.5 MG/3ML) 0.083% nebulizer solution, Take 3 mLs (2.5 mg total) by nebulization every 6 (six) hours as needed for wheezing or shortness of breath., Disp: 75 mL, Rfl: 11   albuterol (VENTOLIN HFA) 108 (90 Base) MCG/ACT inhaler, Inhale 1-2 puffs into the lungs every 6 (six) hours as needed for wheezing or shortness of breath., Disp: 1 each, Rfl: 12   amLODipine (NORVASC) 10 MG tablet, Take 1 tablet (10 mg total) by mouth daily., Disp: 90 tablet, Rfl: 3   aspirin EC 81 MG tablet, Take 81 mg by mouth daily. Swallow whole., Disp: , Rfl:    atorvastatin (LIPITOR) 20 MG tablet, Take 1 tablet (20 mg total) by mouth daily., Disp: 90 tablet, Rfl: 3   azithromycin (ZITHROMAX) 250 MG tablet, With food Take 2 tablets on day 1, then 1 tablet daily on days 2 through 5, Disp: 6 tablet, Rfl: 0   cloNIDine (CATAPRES) 0.1 MG tablet, Take 2 tablets (0.2 mg total) by mouth 2 (two) times daily. May take an extra 0.1 mg clonidine for blood pressure >160, Disp: 400 tablet, Rfl: 3   esomeprazole (NEXIUM) 20 MG capsule, Take 20 mg by mouth daily., Disp: , Rfl:    furosemide (LASIX) 40 MG tablet, Take 40 mg by mouth 2 (two) times daily., Disp: , Rfl:    loratadine (CLARITIN) 10 MG tablet, Take 10 mg by mouth daily., Disp: , Rfl:    magnesium oxide  (MAG-OX) 400 MG tablet, Take 400 mg by mouth 2 (two) times daily., Disp: , Rfl:    metFORMIN (GLUCOPHAGE) 500 MG tablet, Take by mouth., Disp: , Rfl:    pregabalin (LYRICA) 75 MG capsule, Take 1 capsule (75 mg total) by mouth 2 (two) times daily.,  Disp: 60 capsule, Rfl: 5   sodium bicarbonate 650 MG tablet, Take 650 mg by mouth 2 (two) times daily., Disp: , Rfl:    sodium chloride (OCEAN) 0.65 % SOLN nasal spray, Place 2 sprays into both nostrils as needed for congestion., Disp: 30 mL, Rfl: 2   Sodium Zirconium Cyclosilicate (LOKELMA PO), Take by mouth in the morning and at bedtime., Disp: , Rfl:    tacrolimus ER (ENVARSUS XR) 1 MG TB24, Take 3 mg by mouth daily., Disp: , Rfl:    tacrolimus ER (ENVARSUS XR) 4 MG TB24, Take 8 mg by mouth daily., Disp: , Rfl:    Vitamin D, Ergocalciferol, (DRISDOL) 1.25 MG (50000 UNIT) CAPS capsule, Take 50,000 Units by mouth every 7 (seven) days. Mondays, Disp: , Rfl:    ENVARSUS XR 1 MG TB24, Take 1 mg by mouth daily as needed. Total dose is 9 mg every morning at 0900. (Patient not taking: Reported on 07/03/2021), Disp: , Rfl:    mupirocin ointment (BACTROBAN) 2 %, Apply 1 application topically 2 (two) times daily. Right arm (Patient not taking: Reported on 07/03/2021), Disp: 30 g, Rfl: 0   nitroGLYCERIN (NITROSTAT) 0.4 MG SL tablet, Place 1 tablet (0.4 mg total) under the tongue every 5 (five) minutes as needed for chest pain. (Patient not taking: Reported on 07/03/2021), Disp: 25 tablet, Rfl: 3   tacrolimus ER (ENVARSUS XR) 4 MG TB24, Take 8 mg by mouth daily before breakfast. Total dose is 9 mg daily in the morning. (Patient not taking: Reported on 07/03/2021), Disp: , Rfl:   EXAM:  VITALS per patient if applicable:  GENERAL: alert, oriented, appears well and in no acute distress  HEENT: atraumatic, conjunttiva clear, no obvious abnormalities on inspection of external nose and ears  PSYCH/NEURO: pleasant and cooperative, no obvious depression or anxiety, speech  and thought processing grossly intact  ASSESSMENT AND PLAN:  Discussed the following assessment and plan:  Chronic congestive heart failure, unspecified heart failure type ([place])  possibly due to valvular heart disease mild to moderate  F/u cards sch 07/16/21  Echo 06/17/21  Summary    1. The left ventricular systolic function is normal, LVEF is visually  estimated at 60-65%.    2. The right ventricle is normal in size, with normal systolic function.    3. Evidence for elevated left venticular end-diastolic pressure.    4. There is mild to moderate mitral valve regurgitation.    5. There is mild to moderate mitral stenosis.    6. There is mild to moderate aortic regurgitation.    7. There is mild to moderate aortic valve stenosis.    8. There is mild pulmonary hypertension.    - Plan: Comprehensive metabolic panel, CBC with Differential/Platelet  Mass of right breast, unspecified quadrant - Plan: US BREAST LTD UNI RIGHT INC AXILLA  Frontal sinusitis, unspecified chronicity - Plan: sodium chloride (OCEAN) 0.65 % SOLN nasal spray, azithromycin (ZITHROMAX) 250 MG tablet  Acute cystitis without hematuria - Plan: Urine Culture Will get h/h to do urine culture, cmet, cbc  Anemia, of chronic disease (kidney disease)- Plan: CBC with Differential/Platelet  -will [facility] renal to see if she would benefit auryxia   Fatigue, unspecified type - Plan: Comprehensive metabolic panel, CBC with Differential/Platelet  COVID-19 s/p remdesivir  Doing better but having sinus issues and nose bleeding will rx nasal saline, AZM   HM Flu shot utd  pna 23 utd  prevnar utd covid pfizer  5/5 s/p renal  transplant    Tetanus UTD 06/09/10 consider in future update this sent rx pharmacy 04/08/21  Hep B immune had engerix as well 4 doses 2014-2015  Consider shingrix in future disc prev pt unsure if insurance will cover it  HCV neg 04/25/19 Hep B sAb 98.50 03/28/19 HIV neg 03/28/19     Pap neg  11/13/16 neg HPV s/p hysterectomy  -will ask in future if h/o abnormal pap if so will do pap if not d/c paps    mammo 12/2020 breast calcifications needs repeat dx b/l in 6 months orders in    h/o multiple polyps colonoscopy 08/20/17 repeat in 3 years [facility] or [facility] can do this  -EGD 03/11/18 neg gastritis  -EGD ulcers 04/2019 neg HSV/H pylori [facility] gi egd  08/25/17 several polyps due 08/25/20 tubular adenoma  Referred Dr. [name] appt 06/2021 and Dr. Marius Ditch 05/22/21    Former smoker x 25-30 years <1 ppd  CTA chest 01/12/18 moderate b/l pleural effusions and CAD and aortic atherosclerosis, no nodules   ct chest had 09/12/20 [facility]    Thyroid nodules/parathyroid mass   [place] 06/16/17 right upper thyroid nodule ? Parathyroid mass 1.1 cm and left mid nodule f/u annually last 02/01/20    Thyroid US 02/01/20 [facility]  Additional similar solid cystic or cystic nodules identified, without suspicious features.  Incidentally noted nonocclusive atherosclerotic plaque noted in the right common carotid artery.  IMPRESSION:  No suspicious thyroid nodules identified.      GI appt [facility] 06/20/19 f/u esophageal ulcers Dr. [name] Cards Dr. Rockey Situ Renal [facility] renal and transplant s/p kidney transplant 03/28/20 [facility] eye exam sch 07/2021    -we discussed possible serious and likely etiologies, options for evaluation and workup, limitations of telemedicine visit vs in person visit, treatment, treatment risks and precautions. Pt is agreeable to treatment via telemedicine at this moment.     I discussed the assessment and treatment plan with the patient. The patient was provided an opportunity to ask questions and all were answered. The patient agreed with the plan and demonstrated an understanding of the instructions.    Time spent  20 minutes  Delorise Jackson, MD

## 2021-07-04 DIAGNOSIS — I509 Heart failure, unspecified: Principal | ICD-10-CM

## 2021-07-04 DIAGNOSIS — E1122 Type 2 diabetes mellitus with diabetic chronic kidney disease: Principal | ICD-10-CM

## 2021-07-04 DIAGNOSIS — Z794 Long term (current) use of insulin: Principal | ICD-10-CM

## 2021-07-04 DIAGNOSIS — U071 COVID-19: Secondary | ICD-10-CM | POA: Diagnosis not present

## 2021-07-04 DIAGNOSIS — J309 Allergic rhinitis, unspecified: Secondary | ICD-10-CM | POA: Diagnosis not present

## 2021-07-04 DIAGNOSIS — E875 Hyperkalemia: Secondary | ICD-10-CM | POA: Diagnosis not present

## 2021-07-04 DIAGNOSIS — I080 Rheumatic disorders of both mitral and aortic valves: Secondary | ICD-10-CM | POA: Diagnosis not present

## 2021-07-04 DIAGNOSIS — I130 Hypertensive heart and chronic kidney disease with heart failure and stage 1 through stage 4 chronic kidney disease, or unspecified chronic kidney disease: Secondary | ICD-10-CM | POA: Diagnosis not present

## 2021-07-04 DIAGNOSIS — N1832 Chronic kidney disease, stage 3b: Secondary | ICD-10-CM | POA: Diagnosis not present

## 2021-07-04 DIAGNOSIS — J449 Chronic obstructive pulmonary disease, unspecified: Secondary | ICD-10-CM | POA: Diagnosis not present

## 2021-07-04 DIAGNOSIS — G8929 Other chronic pain: Secondary | ICD-10-CM | POA: Diagnosis not present

## 2021-07-04 DIAGNOSIS — E1140 Type 2 diabetes mellitus with diabetic neuropathy, unspecified: Secondary | ICD-10-CM | POA: Diagnosis not present

## 2021-07-04 DIAGNOSIS — M549 Dorsalgia, unspecified: Secondary | ICD-10-CM | POA: Diagnosis not present

## 2021-07-04 DIAGNOSIS — D84821 Immunodeficiency due to drugs: Secondary | ICD-10-CM | POA: Diagnosis not present

## 2021-07-04 DIAGNOSIS — G4733 Obstructive sleep apnea (adult) (pediatric): Secondary | ICD-10-CM | POA: Diagnosis not present

## 2021-07-04 DIAGNOSIS — I2510 Atherosclerotic heart disease of native coronary artery without angina pectoris: Secondary | ICD-10-CM | POA: Diagnosis not present

## 2021-07-04 DIAGNOSIS — E11319 Type 2 diabetes mellitus with unspecified diabetic retinopathy without macular edema: Secondary | ICD-10-CM | POA: Diagnosis not present

## 2021-07-04 DIAGNOSIS — Z940 Kidney transplant status: Secondary | ICD-10-CM | POA: Diagnosis not present

## 2021-07-04 DIAGNOSIS — I272 Pulmonary hypertension, unspecified: Secondary | ICD-10-CM | POA: Diagnosis not present

## 2021-07-04 DIAGNOSIS — D631 Anemia in chronic kidney disease: Secondary | ICD-10-CM | POA: Diagnosis not present

## 2021-07-04 DIAGNOSIS — Z79621 Long term (current) use of calcineurin inhibitor: Secondary | ICD-10-CM | POA: Diagnosis not present

## 2021-07-04 DIAGNOSIS — Z7984 Long term (current) use of oral hypoglycemic drugs: Secondary | ICD-10-CM | POA: Diagnosis not present

## 2021-07-04 DIAGNOSIS — E785 Hyperlipidemia, unspecified: Secondary | ICD-10-CM | POA: Diagnosis not present

## 2021-07-04 DIAGNOSIS — K219 Gastro-esophageal reflux disease without esophagitis: Secondary | ICD-10-CM | POA: Diagnosis not present

## 2021-07-04 DIAGNOSIS — I6523 Occlusion and stenosis of bilateral carotid arteries: Secondary | ICD-10-CM | POA: Diagnosis not present

## 2021-07-04 DIAGNOSIS — I5033 Acute on chronic diastolic (congestive) heart failure: Secondary | ICD-10-CM | POA: Diagnosis not present

## 2021-07-04 DIAGNOSIS — E1151 Type 2 diabetes mellitus with diabetic peripheral angiopathy without gangrene: Secondary | ICD-10-CM | POA: Diagnosis not present

## 2021-07-06 DIAGNOSIS — Z794 Long term (current) use of insulin: Principal | ICD-10-CM

## 2021-07-06 DIAGNOSIS — E1122 Type 2 diabetes mellitus with diabetic chronic kidney disease: Principal | ICD-10-CM

## 2021-07-06 DIAGNOSIS — I509 Heart failure, unspecified: Principal | ICD-10-CM

## 2021-07-08 DIAGNOSIS — E1122 Type 2 diabetes mellitus with diabetic chronic kidney disease: Principal | ICD-10-CM

## 2021-07-08 DIAGNOSIS — Z9181 History of falling: Principal | ICD-10-CM

## 2021-07-08 DIAGNOSIS — I509 Heart failure, unspecified: Principal | ICD-10-CM

## 2021-07-08 DIAGNOSIS — R2681 Unsteadiness on feet: Principal | ICD-10-CM

## 2021-07-08 DIAGNOSIS — Z794 Long term (current) use of insulin: Principal | ICD-10-CM

## 2021-07-09 DIAGNOSIS — E1122 Type 2 diabetes mellitus with diabetic chronic kidney disease: Principal | ICD-10-CM

## 2021-07-09 DIAGNOSIS — I509 Heart failure, unspecified: Principal | ICD-10-CM

## 2021-07-09 DIAGNOSIS — Z794 Long term (current) use of insulin: Principal | ICD-10-CM

## 2021-07-09 DIAGNOSIS — E785 Hyperlipidemia, unspecified: Secondary | ICD-10-CM | POA: Diagnosis not present

## 2021-07-09 DIAGNOSIS — Z7984 Long term (current) use of oral hypoglycemic drugs: Secondary | ICD-10-CM | POA: Diagnosis not present

## 2021-07-09 DIAGNOSIS — I6523 Occlusion and stenosis of bilateral carotid arteries: Secondary | ICD-10-CM | POA: Diagnosis not present

## 2021-07-09 DIAGNOSIS — N1832 Chronic kidney disease, stage 3b: Secondary | ICD-10-CM | POA: Diagnosis not present

## 2021-07-09 DIAGNOSIS — U071 COVID-19: Secondary | ICD-10-CM | POA: Diagnosis not present

## 2021-07-09 DIAGNOSIS — I080 Rheumatic disorders of both mitral and aortic valves: Secondary | ICD-10-CM | POA: Diagnosis not present

## 2021-07-09 DIAGNOSIS — Z79621 Long term (current) use of calcineurin inhibitor: Secondary | ICD-10-CM | POA: Diagnosis not present

## 2021-07-09 DIAGNOSIS — K219 Gastro-esophageal reflux disease without esophagitis: Secondary | ICD-10-CM | POA: Diagnosis not present

## 2021-07-09 DIAGNOSIS — M549 Dorsalgia, unspecified: Secondary | ICD-10-CM | POA: Diagnosis not present

## 2021-07-09 DIAGNOSIS — E1140 Type 2 diabetes mellitus with diabetic neuropathy, unspecified: Secondary | ICD-10-CM | POA: Diagnosis not present

## 2021-07-09 DIAGNOSIS — I272 Pulmonary hypertension, unspecified: Secondary | ICD-10-CM | POA: Diagnosis not present

## 2021-07-09 DIAGNOSIS — G8929 Other chronic pain: Secondary | ICD-10-CM | POA: Diagnosis not present

## 2021-07-09 DIAGNOSIS — J449 Chronic obstructive pulmonary disease, unspecified: Secondary | ICD-10-CM | POA: Diagnosis not present

## 2021-07-09 DIAGNOSIS — I5033 Acute on chronic diastolic (congestive) heart failure: Secondary | ICD-10-CM | POA: Diagnosis not present

## 2021-07-09 DIAGNOSIS — E11319 Type 2 diabetes mellitus with unspecified diabetic retinopathy without macular edema: Secondary | ICD-10-CM | POA: Diagnosis not present

## 2021-07-09 DIAGNOSIS — G4733 Obstructive sleep apnea (adult) (pediatric): Secondary | ICD-10-CM | POA: Diagnosis not present

## 2021-07-09 DIAGNOSIS — J309 Allergic rhinitis, unspecified: Secondary | ICD-10-CM | POA: Diagnosis not present

## 2021-07-09 DIAGNOSIS — D631 Anemia in chronic kidney disease: Secondary | ICD-10-CM | POA: Diagnosis not present

## 2021-07-09 DIAGNOSIS — E875 Hyperkalemia: Secondary | ICD-10-CM | POA: Diagnosis not present

## 2021-07-09 DIAGNOSIS — I130 Hypertensive heart and chronic kidney disease with heart failure and stage 1 through stage 4 chronic kidney disease, or unspecified chronic kidney disease: Secondary | ICD-10-CM | POA: Diagnosis not present

## 2021-07-09 DIAGNOSIS — I2510 Atherosclerotic heart disease of native coronary artery without angina pectoris: Secondary | ICD-10-CM | POA: Diagnosis not present

## 2021-07-09 DIAGNOSIS — E1151 Type 2 diabetes mellitus with diabetic peripheral angiopathy without gangrene: Secondary | ICD-10-CM | POA: Diagnosis not present

## 2021-07-09 DIAGNOSIS — Z940 Kidney transplant status: Secondary | ICD-10-CM | POA: Diagnosis not present

## 2021-07-09 DIAGNOSIS — D84821 Immunodeficiency due to drugs: Secondary | ICD-10-CM | POA: Diagnosis not present

## 2021-07-10 ENCOUNTER — Encounter
Admit: 2021-07-10 | Discharge: 2021-07-10 | Payer: MEDICARE | Attending: Student in an Organized Health Care Education/Training Program | Primary: Student in an Organized Health Care Education/Training Program

## 2021-07-10 DIAGNOSIS — Z794 Long term (current) use of insulin: Principal | ICD-10-CM

## 2021-07-10 DIAGNOSIS — E1122 Type 2 diabetes mellitus with diabetic chronic kidney disease: Principal | ICD-10-CM

## 2021-07-10 DIAGNOSIS — I5033 Acute on chronic diastolic (congestive) heart failure: Principal | ICD-10-CM

## 2021-07-10 DIAGNOSIS — I130 Hypertensive heart and chronic kidney disease with heart failure and stage 1 through stage 4 chronic kidney disease, or unspecified chronic kidney disease: Principal | ICD-10-CM

## 2021-07-10 DIAGNOSIS — I509 Heart failure, unspecified: Principal | ICD-10-CM

## 2021-07-10 DIAGNOSIS — I6523 Occlusion and stenosis of bilateral carotid arteries: Secondary | ICD-10-CM | POA: Diagnosis not present

## 2021-07-10 DIAGNOSIS — Z940 Kidney transplant status: Secondary | ICD-10-CM | POA: Diagnosis not present

## 2021-07-10 DIAGNOSIS — U071 COVID-19: Secondary | ICD-10-CM | POA: Diagnosis not present

## 2021-07-10 DIAGNOSIS — G4733 Obstructive sleep apnea (adult) (pediatric): Secondary | ICD-10-CM | POA: Diagnosis not present

## 2021-07-10 DIAGNOSIS — M549 Dorsalgia, unspecified: Secondary | ICD-10-CM | POA: Diagnosis not present

## 2021-07-10 DIAGNOSIS — D631 Anemia in chronic kidney disease: Secondary | ICD-10-CM | POA: Diagnosis not present

## 2021-07-10 DIAGNOSIS — J309 Allergic rhinitis, unspecified: Secondary | ICD-10-CM | POA: Diagnosis not present

## 2021-07-10 DIAGNOSIS — E1151 Type 2 diabetes mellitus with diabetic peripheral angiopathy without gangrene: Secondary | ICD-10-CM | POA: Diagnosis not present

## 2021-07-10 DIAGNOSIS — E785 Hyperlipidemia, unspecified: Secondary | ICD-10-CM | POA: Diagnosis not present

## 2021-07-10 DIAGNOSIS — Z79621 Long term (current) use of calcineurin inhibitor: Secondary | ICD-10-CM | POA: Diagnosis not present

## 2021-07-10 DIAGNOSIS — E875 Hyperkalemia: Secondary | ICD-10-CM | POA: Diagnosis not present

## 2021-07-10 DIAGNOSIS — N1832 Chronic kidney disease, stage 3b: Secondary | ICD-10-CM | POA: Diagnosis not present

## 2021-07-10 DIAGNOSIS — D84821 Immunodeficiency due to drugs: Secondary | ICD-10-CM | POA: Diagnosis not present

## 2021-07-10 DIAGNOSIS — E1140 Type 2 diabetes mellitus with diabetic neuropathy, unspecified: Secondary | ICD-10-CM | POA: Diagnosis not present

## 2021-07-10 DIAGNOSIS — I272 Pulmonary hypertension, unspecified: Secondary | ICD-10-CM | POA: Diagnosis not present

## 2021-07-10 DIAGNOSIS — Z7984 Long term (current) use of oral hypoglycemic drugs: Secondary | ICD-10-CM | POA: Diagnosis not present

## 2021-07-10 DIAGNOSIS — I080 Rheumatic disorders of both mitral and aortic valves: Secondary | ICD-10-CM | POA: Diagnosis not present

## 2021-07-10 DIAGNOSIS — K219 Gastro-esophageal reflux disease without esophagitis: Secondary | ICD-10-CM | POA: Diagnosis not present

## 2021-07-10 DIAGNOSIS — G8929 Other chronic pain: Secondary | ICD-10-CM | POA: Diagnosis not present

## 2021-07-10 DIAGNOSIS — J449 Chronic obstructive pulmonary disease, unspecified: Secondary | ICD-10-CM | POA: Diagnosis not present

## 2021-07-10 DIAGNOSIS — I2510 Atherosclerotic heart disease of native coronary artery without angina pectoris: Secondary | ICD-10-CM | POA: Diagnosis not present

## 2021-07-10 DIAGNOSIS — E11319 Type 2 diabetes mellitus with unspecified diabetic retinopathy without macular edema: Secondary | ICD-10-CM | POA: Diagnosis not present

## 2021-07-11 DIAGNOSIS — Z794 Long term (current) use of insulin: Principal | ICD-10-CM

## 2021-07-11 DIAGNOSIS — I509 Heart failure, unspecified: Principal | ICD-10-CM

## 2021-07-11 DIAGNOSIS — E1122 Type 2 diabetes mellitus with diabetic chronic kidney disease: Principal | ICD-10-CM

## 2021-07-11 DIAGNOSIS — D631 Anemia in chronic kidney disease: Secondary | ICD-10-CM | POA: Diagnosis not present

## 2021-07-11 DIAGNOSIS — K219 Gastro-esophageal reflux disease without esophagitis: Secondary | ICD-10-CM | POA: Diagnosis not present

## 2021-07-11 DIAGNOSIS — D84821 Immunodeficiency due to drugs: Secondary | ICD-10-CM | POA: Diagnosis not present

## 2021-07-11 DIAGNOSIS — E785 Hyperlipidemia, unspecified: Secondary | ICD-10-CM | POA: Diagnosis not present

## 2021-07-11 DIAGNOSIS — I6523 Occlusion and stenosis of bilateral carotid arteries: Secondary | ICD-10-CM | POA: Diagnosis not present

## 2021-07-11 DIAGNOSIS — G8929 Other chronic pain: Secondary | ICD-10-CM | POA: Diagnosis not present

## 2021-07-11 DIAGNOSIS — E11319 Type 2 diabetes mellitus with unspecified diabetic retinopathy without macular edema: Secondary | ICD-10-CM | POA: Diagnosis not present

## 2021-07-11 DIAGNOSIS — E1151 Type 2 diabetes mellitus with diabetic peripheral angiopathy without gangrene: Secondary | ICD-10-CM | POA: Diagnosis not present

## 2021-07-11 DIAGNOSIS — I272 Pulmonary hypertension, unspecified: Secondary | ICD-10-CM | POA: Diagnosis not present

## 2021-07-11 DIAGNOSIS — U071 COVID-19: Secondary | ICD-10-CM | POA: Diagnosis not present

## 2021-07-11 DIAGNOSIS — Z940 Kidney transplant status: Secondary | ICD-10-CM | POA: Diagnosis not present

## 2021-07-11 DIAGNOSIS — I5033 Acute on chronic diastolic (congestive) heart failure: Secondary | ICD-10-CM | POA: Diagnosis not present

## 2021-07-11 DIAGNOSIS — E1140 Type 2 diabetes mellitus with diabetic neuropathy, unspecified: Secondary | ICD-10-CM | POA: Diagnosis not present

## 2021-07-11 DIAGNOSIS — I130 Hypertensive heart and chronic kidney disease with heart failure and stage 1 through stage 4 chronic kidney disease, or unspecified chronic kidney disease: Secondary | ICD-10-CM | POA: Diagnosis not present

## 2021-07-11 DIAGNOSIS — J309 Allergic rhinitis, unspecified: Secondary | ICD-10-CM | POA: Diagnosis not present

## 2021-07-11 DIAGNOSIS — G4733 Obstructive sleep apnea (adult) (pediatric): Secondary | ICD-10-CM | POA: Diagnosis not present

## 2021-07-11 DIAGNOSIS — M549 Dorsalgia, unspecified: Secondary | ICD-10-CM | POA: Diagnosis not present

## 2021-07-11 DIAGNOSIS — I080 Rheumatic disorders of both mitral and aortic valves: Secondary | ICD-10-CM | POA: Diagnosis not present

## 2021-07-11 DIAGNOSIS — I2510 Atherosclerotic heart disease of native coronary artery without angina pectoris: Secondary | ICD-10-CM | POA: Diagnosis not present

## 2021-07-11 DIAGNOSIS — Z79621 Long term (current) use of calcineurin inhibitor: Secondary | ICD-10-CM | POA: Diagnosis not present

## 2021-07-11 DIAGNOSIS — N1832 Chronic kidney disease, stage 3b: Secondary | ICD-10-CM | POA: Diagnosis not present

## 2021-07-11 DIAGNOSIS — J449 Chronic obstructive pulmonary disease, unspecified: Secondary | ICD-10-CM | POA: Diagnosis not present

## 2021-07-11 DIAGNOSIS — Z7984 Long term (current) use of oral hypoglycemic drugs: Secondary | ICD-10-CM | POA: Diagnosis not present

## 2021-07-11 DIAGNOSIS — E875 Hyperkalemia: Secondary | ICD-10-CM | POA: Diagnosis not present

## 2021-07-15 ENCOUNTER — Telehealth: Payer: Self-pay | Admitting: Internal Medicine

## 2021-07-15 ENCOUNTER — Inpatient Hospital Stay: Admission: RE | Admit: 2021-07-15 | Payer: Medicare Other | Source: Ambulatory Visit

## 2021-07-15 NOTE — Telephone Encounter (Signed)
Pt called stating she could no find the building to her referral appointment so she needs the appointment rescheduled.

## 2021-07-15 NOTE — Progress Notes (Signed)
Cardiology Office Note  Date:  07/16/2021   ID:  COLE KLUGH, DOB Jul 18, 1955, MRN [id number]  PCP:  McLean-Scocuzza, Nino Glow, MD   Chief Complaint  Patient presents with   6 month follow up     Patient c/o shortness of breath with little to no exertion. Medications reviewed by the patient verbally.     HPI:  Ms. Tessler is a pleasant 66 -year old woman with  CAD,  Drug-eluting stent placed December 2014 to the mid RCA Repeat cath 2017 for positive stress test at [facility], medical management 25 years of smoking (quit in 2013-2014),  diabetes,  chronic renal insufficiency,   moderate to severe mitral valve regurgitation on prior echocardiogram (improved to moderate MR on recent echo), pneumonia in June of 2012 with admission overnight to [facility] ESRD on dialysis since February 2014, 3 days per week. 59% bilateral carotid arterial disease as of 2017  Positive stress test  29-Mar-2016 at [facility] Renal transplant 2020 She presents today for follow-up of her coronary artery disease   Last seen by myself in clinic August 2022 At the time significant stress, getting evicted, blood pressure was elevated, unclear medication compliance History of flash pulmonary edema  Recent admission to [place], chest pain admission 1/723 to 1/20 macrocytic anemia (H/H 7.9/23.8), hyperkalemia (5.5), low bicarb (19), elevate creatinine (1.24), hypoalbuminemia (2.8), elevated BNP (854 CXR showed findings suggestive of pulmonary edema  started on lasix IV   PET Myocardial Stress was performed on 06/19/2021 which showed no clear evidence of any significant ischemia or scar, normal left ventricular systolic function (post stress ejection fraction is > 60%), coronary and aortic calcifications, small bilateral pleural effusions, trace pericardial effusion and diffuse pulmonary edema.  TTE in 05/2021 showed normal LV structure and function, grade I diastolic dysfunction, moderate LVH, and normal RV structure and function. Her  TTE this hospitalization showed mildly thickened LV wall, normal LV function (LVEF >60-65%), elevated LV end-diastolic pressure, mild-to-moderate MR / MS / AR / aortic stenosis, normal RV structure and function, and mild PAH  Klebsiella / Ecoli UTI  COVID-19 - Likely contracted in hospital as she was negative on admission received a course of remdesivir   iron studies this hospitalization showed findings consistent with ACD with a % saturation of 13.  D/c  06/28/21  Cr 1.21, potasium 5 (six days ago) Out of lasix a few days Reports that she feels tired She is not on hydralazine Denies any leg swelling, no abdominal distention Blood pressure 283 systolic today Reports recent weight loss through her hospitalization  EKG personally reviewed by myself on todays visit Normal sinus rhythm rate 79 bpm LVH  Other past medical history reviewed  Myoview January 2019 at [facility] no ischemia Echocardiogram February 2019 [facility] moderate MR, normal ejection fraction Tolerating hemodialysis Monday Wednesday Friday Echocardiogram July 2020 normal ejection fraction [facility] Mar 30, 2019 deceased donor renal transplant   hospital November 2021 Hypertensive urgency, flash pulmonary edema, mitral valve regurgitation She had stopped several of her blood pressure medications, these were restarted with improved blood pressure Now on amlodipine carvedilol clonidine Sublingual nitro for any spikes in pressure  Cardic catheterization 2017 showed 70% disease ostial right PDA, unchanged from 2014 cardiac catheterization otherwise other regions of 20-30% disease, nonobstructive  PMH:   has a past medical history of Anemia of chronic disease, Anginal pain ([place]), Carotid arterial disease ([place]), Chronic constipation, Chronic diastolic CHF (congestive heart failure) ([place]), Colon polyps, COPD (chronic obstructive pulmonary disease) ([place]), Coronary  artery disease, COVID-19, Diabetes mellitus, Diabetic neuropathy ([place]), Diabetic  retinopathy ([place]) (05/28/2013), Emphysema, ESRD on hemodialysis ([place]), History of bronchitis, History of pneumonia, History of tobacco abuse, Hyperlipidemia, Hypertension, Moderate to severe mitral insufficiency, Myocardial infarct [facility]) (05/2013), Peripheral vascular disease ([place]), Sickle cell trait ([place]), Thyroid nodule, and Valvular heart disease.  PSH:    Past Surgical History:  Procedure Laterality Date   A/V FISTULAGRAM Left 11/10/2017   Procedure: A/V FISTULAGRAM;  Surgeon: Katha Cabal, MD;  Location: [place] CV LAB;  Service: Cardiovascular;  Laterality: Left;   A/V SHUNTOGRAM Left 02/08/2019   Procedure: A/V SHUNTOGRAM;  Surgeon: Katha Cabal, MD;  Location: [place] CV LAB;  Service: Cardiovascular;  Laterality: Left;   ABDOMINAL HYSTERECTOMY     2000 for cysts on ovaries total no abnormal pap   CARDIAC CATHETERIZATION     CARDIAC CATHETERIZATION N/A 05/02/2015   Procedure: Left Heart Cath and Coronary Angiography;  Surgeon: [name], MD;  Location: [place] CV LAB;  Service: Cardiovascular;  Laterality: N/A;   COLONOSCOPY WITH PROPOFOL N/A 07/08/2016   Procedure: COLONOSCOPY WITH PROPOFOL;  Surgeon: [name], MD;  Location: [facility] ENDOSCOPY;  Service: Endoscopy;  Laterality: N/A;   COLONOSCOPY WITH PROPOFOL N/A 08/20/2017   Procedure: COLONOSCOPY WITH PROPOFOL;  Surgeon: [name], MD;  Location: [facility] ENDOSCOPY;  Service: Gastroenterology;  Laterality: N/A;   colonscopy     CORONARY ANGIOPLASTY  05/28/2014   stent placement to the mid RCA   CYSTOURETHROSCOPY     [facility] urology with stent placement 11 or 05/2019 and stent removal 06/13/19    DILATION AND CURETTAGE OF UTERUS     several in the early 80's   ESOPHAGOGASTRODUODENOSCOPY     2012   ESOPHAGOGASTRODUODENOSCOPY (EGD) WITH PROPOFOL N/A 03/11/2018   Procedure: ESOPHAGOGASTRODUODENOSCOPY (EGD) WITH PROPOFOL;  Surgeon: [name], MD;  Location: [facility] ENDOSCOPY;  Service: Gastroenterology;   Laterality: N/A;   EYE SURGERY     bilateral laser 2012   EYE SURGERY     right   EYE SURGERY     x4 both eyes   GAS INSERTION  09/30/2011   Procedure: INSERTION OF GAS;  Surgeon: [name], MD;  Location: [place];  Service: Ophthalmology;  Laterality: Right;  [id number]   GAS/FLUID EXCHANGE  09/30/2011   Procedure: GAS/FLUID EXCHANGE;  Surgeon: [name], MD;  Location: [place];  Service: Ophthalmology;  Laterality: Right;   KIDNEY TRANSPLANT     03/30/19 Dr. Mickel Baas Thomas/Dr. Cristie Hem Zendel/Dr. Gilford Raid; [facility] transplant Dr. Horald Chestnut will be primary [phone] coordinator Sarah Wynkoop [phone]   KIDNEY TRANSPLANT     03/30/19 [facility]   LEFT HEART CATHETERIZATION WITH CORONARY ANGIOGRAM N/A 05/28/2013   Procedure: LEFT HEART CATHETERIZATION WITH CORONARY ANGIOGRAM;  Surgeon: [name], MD;  Location: [facility] CATH LAB;  Service: Cardiovascular;  Laterality: N/A;   PARS PLANA VITRECTOMY  04/22/2011   Procedure: PARS PLANA VITRECTOMY WITH 25 GAUGE;  Surgeon: [name], MD;  Location: [place];  Service: Ophthalmology;  Laterality: Left;  membrane peel, endolaser, gas fluid exchange, silicone oil, repair of complex traction retinal detachment   PARS PLANA VITRECTOMY  09/30/2011   Procedure: PARS PLANA VITRECTOMY WITH 25 GAUGE;  Surgeon: [name], MD;  Location: [place];  Service: Ophthalmology;  Laterality: Right;  Endolaser; Repair of Complex Traction Retinal Detachment   PARS PLANA VITRECTOMY  02/24/2012   Procedure: PARS PLANA VITRECTOMY WITH 25 GAUGE;  Surgeon: [name], MD;  Location: [place];  Service: Ophthalmology;  Laterality: Left;   PTCA     SILICON OIL REMOVAL  56/31/4970   Procedure: SILICON OIL REMOVAL;  Surgeon: [name], MD;  Location: [place];  Service: Ophthalmology;  Laterality: Left;   THROMBECTOMY / ARTERIOVENOUS GRAFT REVISION     TUBAL LIGATION     1979   UPPER EXTREMITY ANGIOGRAPHY Left 02/08/2019   Procedure: UPPER EXTREMITY ANGIOGRAPHY;   Surgeon: Katha Cabal, MD;  Location: [place] CV LAB;  Service: Cardiovascular;  Laterality: Left;    Current Outpatient Medications  Medication Sig Dispense Refill   acetaminophen (TYLENOL) 500 MG tablet Take 1,000 mg by mouth daily as needed.     albuterol (PROVENTIL) (2.5 MG/3ML) 0.083% nebulizer solution Take 3 mLs (2.5 mg total) by nebulization every 6 (six) hours as needed for wheezing or shortness of breath. 75 mL 11   albuterol (VENTOLIN HFA) 108 (90 Base) MCG/ACT inhaler Inhale 1-2 puffs into the lungs every 6 (six) hours as needed for wheezing or shortness of breath. 1 each 12   amLODipine (NORVASC) 10 MG tablet Take 1 tablet (10 mg total) by mouth daily. 90 tablet 3   aspirin EC 81 MG tablet Take 81 mg by mouth daily. Swallow whole.     atorvastatin (LIPITOR) 20 MG tablet Take 1 tablet (20 mg total) by mouth daily. 90 tablet 3   cloNIDine (CATAPRES) 0.1 MG tablet Take 2 tablets (0.2 mg total) by mouth 2 (two) times daily. May take an extra 0.1 mg clonidine for blood pressure >160 400 tablet 3   esomeprazole (NEXIUM) 20 MG capsule Take 20 mg by mouth daily.     ferrous sulfate 325 (65 FE) MG EC tablet Take 1 tablet (325 mg total) by mouth 3 (three) times daily with meals. 90 tablet 3   furosemide (LASIX) 40 MG tablet Take 40 mg by mouth 2 (two) times daily.     loratadine (CLARITIN) 10 MG tablet Take 10 mg by mouth daily.     magnesium oxide (MAG-OX) 400 MG tablet Take 400 mg by mouth 2 (two) times daily.     metFORMIN (GLUCOPHAGE) 500 MG tablet Take by mouth.     nitroGLYCERIN (NITROSTAT) 0.4 MG SL tablet Place 1 tablet (0.4 mg total) under the tongue every 5 (five) minutes as needed for chest pain. 25 tablet 3   pregabalin (LYRICA) 75 MG capsule Take 1 capsule (75 mg total) by mouth 2 (two) times daily. 60 capsule 5   sodium bicarbonate 650 MG tablet Take 650 mg by mouth 2 (two) times daily.     sodium chloride (OCEAN) 0.65 % SOLN nasal spray Place 2 sprays into both  nostrils as needed for congestion. 30 mL 2   Sodium Zirconium Cyclosilicate (LOKELMA PO) Take by mouth in the morning and at bedtime.     tacrolimus ER (ENVARSUS XR) 1 MG TB24 Take 3 mg by mouth daily.     tacrolimus ER (ENVARSUS XR) 4 MG TB24 Take 8 mg by mouth daily.     Vitamin D, Ergocalciferol, (DRISDOL) 1.25 MG (50000 UNIT) CAPS capsule Take 50,000 Units by mouth every 7 (seven) days. Mondays     ENVARSUS XR 1 MG TB24 Take 1 mg by mouth daily as needed. Total dose is 9 mg every morning at 0900. (Patient not taking: Reported on 07/03/2021)     tacrolimus ER (ENVARSUS XR) 4 MG TB24 Take 8 mg by mouth daily  before breakfast. Total dose is 9 mg daily in the morning. (Patient not taking: Reported on 07/03/2021)     No current facility-administered medications for this visit.     Allergies:   Hydrocodone and Lisinopril   Social History:  The patient  reports that she quit smoking about 2 years ago. Her smoking use included cigarettes. She has a 25.00 pack-year smoking history. She has never used smokeless tobacco. She reports that she does not drink alcohol and does not use drugs.   Family History:   family history includes Breast cancer in her sister; Colon cancer in her father and sister; Diabetes in her brother, brother, and daughter; Heart attack in her brother and mother; Heart disease in her mother; Kidney failure in her daughter; Renal Disease in her daughter; Stroke in her brother and mother.    Review of Systems: Review of Systems  Constitutional:  Positive for malaise/fatigue.  HENT: Negative.    Respiratory: Negative.    Cardiovascular: Negative.   Gastrointestinal: Negative.   Musculoskeletal: Negative.   Neurological: Negative.   Psychiatric/Behavioral: Negative.    All other systems reviewed and are negative.  PHYSICAL EXAM: VS:  BP (!) 120/40 (BP Location: Left Arm, Patient Position: Sitting, Cuff Size: Normal)    Pulse 79    Ht 5\' 7"  (1.702 m)    Wt 151 lb 2 oz (68.5 kg)     SpO2 98%    BMI 23.67 kg/m  , BMI Body mass index is 23.67 kg/m. Constitutional:  oriented to person, place, and time. No distress.  HENT:  Head: Grossly normal Eyes:  no discharge. No scleral icterus.  Neck: No JVD, no carotid bruits  Cardiovascular: Regular rate and rhythm, no murmurs appreciated Pulmonary/Chest: Clear to auscultation bilaterally, no wheezes or rails Abdominal: Soft.  no distension.  no tenderness.  Musculoskeletal: Normal range of motion Neurological:  normal muscle tone. Coordination normal. No atrophy Skin: Skin warm and dry Psychiatric: normal affect, pleasant  Recent Labs: 05/19/2021: TSH 1.976 05/21/2021: ALT 10; Magnesium 2.3 06/07/2021: BUN 14; Creatinine, Ser 1.45; Hemoglobin 8.9; Platelets 178; Potassium 5.0; Sodium 129    Lipid Panel Lab Results  Component Value Date   CHOL 161 05/22/2016   HDL 59 05/22/2016   LDLCALC 89 05/22/2016   TRIG 65 05/22/2016      Wt Readings from Last 3 Encounters:  07/16/21 151 lb 2 oz (68.5 kg)  07/03/21 172 lb (78 kg)  05/28/21 160 lb 4.4 oz (72.7 kg)     ASSESSMENT AND PLAN:  Essential (primary) hypertension -  Blood pressure well controlled on today's visit, will continue clonidine at current dose 0.2 twice daily, amlodipine 10 daily  Coronary artery disease involving native coronary artery of native heart without angina pectoris -  Currently with no symptoms of angina. No further workup at this time. Continue current medication regimen.  Mitral valve insufficiency, unspecified etiology - Previous echocardiogram September 2017 with moderate to severe MR Mild to moderate on echocardiogram at [facility] this past month  ESRD, on hemodialysis ([place]) Off HD Had transplant Followed by [facility] She has run out of Lasix, recent discharge from [facility] was on Lasix 40 twice daily Prior to admission was not on Lasix Recommend she start Lasix 40 daily, extra 40 for any leg swelling abdominal bloating shortness of  breath weight gain  Symptomatic anemia Iron low in the hospital hemoglobin 7.9 likely leading to admission in addition to UTI Recommend she stay on iron sulfate at least twice  a day Prescription sent in  Diabetic polyneuropathy associated with type 2 diabetes mellitus (HCC) HBA1C well controlled Off medications  S/P coronary artery stent placement Denies anginal symptoms  Carotid stenosis Less than 39% bilaterally Has stenosis left subclavian Quit smoking, 2014  Long discussion concerning recent hospitalization at [facility], notes including imaging studies reviewed with her in detail  Total encounter time more than 40 minutes  Greater than 50% was spent in counseling and coordination of care with the patient   Orders Placed This Encounter  Procedures   EKG 12-Lead      Signed, Esmond Plants, M.D., Ph.D. 07/16/2021  [place], [place]

## 2021-07-16 ENCOUNTER — Other Ambulatory Visit: Payer: Self-pay

## 2021-07-16 ENCOUNTER — Inpatient Hospital Stay: Payer: Medicare Other | Admitting: Internal Medicine

## 2021-07-16 ENCOUNTER — Encounter: Payer: Self-pay | Admitting: Cardiovascular Disease

## 2021-07-16 ENCOUNTER — Ambulatory Visit (INDEPENDENT_AMBULATORY_CARE_PROVIDER_SITE_OTHER): Payer: Medicare Other | Admitting: Cardiovascular Disease

## 2021-07-16 VITALS — BP 120/40 | HR 79 | Ht 67.0 in | Wt 151.1 lb

## 2021-07-16 DIAGNOSIS — E1122 Type 2 diabetes mellitus with diabetic chronic kidney disease: Principal | ICD-10-CM

## 2021-07-16 DIAGNOSIS — Z794 Long term (current) use of insulin: Principal | ICD-10-CM

## 2021-07-16 DIAGNOSIS — I509 Heart failure, unspecified: Principal | ICD-10-CM

## 2021-07-16 DIAGNOSIS — I130 Hypertensive heart and chronic kidney disease with heart failure and stage 1 through stage 4 chronic kidney disease, or unspecified chronic kidney disease: Secondary | ICD-10-CM | POA: Diagnosis not present

## 2021-07-16 DIAGNOSIS — E875 Hyperkalemia: Secondary | ICD-10-CM | POA: Diagnosis not present

## 2021-07-16 DIAGNOSIS — I25118 Atherosclerotic heart disease of native coronary artery with other forms of angina pectoris: Secondary | ICD-10-CM

## 2021-07-16 DIAGNOSIS — N1832 Chronic kidney disease, stage 3b: Secondary | ICD-10-CM | POA: Diagnosis not present

## 2021-07-16 DIAGNOSIS — I6523 Occlusion and stenosis of bilateral carotid arteries: Secondary | ICD-10-CM

## 2021-07-16 DIAGNOSIS — I272 Pulmonary hypertension, unspecified: Secondary | ICD-10-CM | POA: Diagnosis not present

## 2021-07-16 DIAGNOSIS — I2510 Atherosclerotic heart disease of native coronary artery without angina pectoris: Secondary | ICD-10-CM | POA: Diagnosis not present

## 2021-07-16 DIAGNOSIS — I340 Nonrheumatic mitral (valve) insufficiency: Secondary | ICD-10-CM | POA: Diagnosis not present

## 2021-07-16 DIAGNOSIS — N186 End stage renal disease: Secondary | ICD-10-CM

## 2021-07-16 DIAGNOSIS — Z7984 Long term (current) use of oral hypoglycemic drugs: Secondary | ICD-10-CM | POA: Diagnosis not present

## 2021-07-16 DIAGNOSIS — Z79621 Long term (current) use of calcineurin inhibitor: Secondary | ICD-10-CM | POA: Diagnosis not present

## 2021-07-16 DIAGNOSIS — D84821 Immunodeficiency due to drugs: Secondary | ICD-10-CM | POA: Diagnosis not present

## 2021-07-16 DIAGNOSIS — G8929 Other chronic pain: Secondary | ICD-10-CM | POA: Diagnosis not present

## 2021-07-16 DIAGNOSIS — K219 Gastro-esophageal reflux disease without esophagitis: Secondary | ICD-10-CM | POA: Diagnosis not present

## 2021-07-16 DIAGNOSIS — J309 Allergic rhinitis, unspecified: Secondary | ICD-10-CM | POA: Diagnosis not present

## 2021-07-16 DIAGNOSIS — E11319 Type 2 diabetes mellitus with unspecified diabetic retinopathy without macular edema: Secondary | ICD-10-CM | POA: Diagnosis not present

## 2021-07-16 DIAGNOSIS — E1151 Type 2 diabetes mellitus with diabetic peripheral angiopathy without gangrene: Secondary | ICD-10-CM | POA: Diagnosis not present

## 2021-07-16 DIAGNOSIS — U071 COVID-19: Secondary | ICD-10-CM | POA: Diagnosis not present

## 2021-07-16 DIAGNOSIS — J449 Chronic obstructive pulmonary disease, unspecified: Secondary | ICD-10-CM | POA: Diagnosis not present

## 2021-07-16 DIAGNOSIS — E785 Hyperlipidemia, unspecified: Secondary | ICD-10-CM | POA: Diagnosis not present

## 2021-07-16 DIAGNOSIS — I5032 Chronic diastolic (congestive) heart failure: Secondary | ICD-10-CM

## 2021-07-16 DIAGNOSIS — E1140 Type 2 diabetes mellitus with diabetic neuropathy, unspecified: Secondary | ICD-10-CM | POA: Diagnosis not present

## 2021-07-16 DIAGNOSIS — I351 Nonrheumatic aortic (valve) insufficiency: Secondary | ICD-10-CM

## 2021-07-16 DIAGNOSIS — I10 Essential (primary) hypertension: Secondary | ICD-10-CM

## 2021-07-16 DIAGNOSIS — I739 Peripheral vascular disease, unspecified: Secondary | ICD-10-CM

## 2021-07-16 DIAGNOSIS — I5033 Acute on chronic diastolic (congestive) heart failure: Secondary | ICD-10-CM | POA: Diagnosis not present

## 2021-07-16 DIAGNOSIS — G4733 Obstructive sleep apnea (adult) (pediatric): Secondary | ICD-10-CM | POA: Diagnosis not present

## 2021-07-16 DIAGNOSIS — N183 Chronic kidney disease, stage 3 unspecified: Secondary | ICD-10-CM | POA: Diagnosis not present

## 2021-07-16 DIAGNOSIS — M549 Dorsalgia, unspecified: Secondary | ICD-10-CM | POA: Diagnosis not present

## 2021-07-16 DIAGNOSIS — D631 Anemia in chronic kidney disease: Secondary | ICD-10-CM | POA: Diagnosis not present

## 2021-07-16 DIAGNOSIS — I080 Rheumatic disorders of both mitral and aortic valves: Secondary | ICD-10-CM | POA: Diagnosis not present

## 2021-07-16 DIAGNOSIS — Z940 Kidney transplant status: Secondary | ICD-10-CM | POA: Diagnosis not present

## 2021-07-16 MED ORDER — FERROUS SULFATE 325 (65 FE) MG PO TBEC: 325.0000 mg | DELAYED_RELEASE_TABLET | Freq: Three times a day (TID) | ORAL | 3 refills | Status: DC

## 2021-07-16 MED ORDER — FUROSEMIDE 40 MG PO TABS: ORAL_TABLET | ORAL | 3 refills | Status: DC

## 2021-07-16 NOTE — Patient Instructions (Addendum)
Medication Instructions:  - Your physician has recommended you make the following change in your medication:   1) START Iron sulfate 325 mg: - take 1 tablet by mouth three times a day with meals  2) RESTART lasix (furosemide) 40 mg: - take 1 tablet by mouth once daily daily - you may take an extra lasix (40 mg) at lunch for leg swelling, abdominal bloating, weight gain, shortness of breath  If you need a refill on your cardiac medications before your next appointment, please call your pharmacy.   Lab work: No new labs needed  Testing/Procedures: No new testing needed  Follow-Up: At [facility], you and your health needs are our priority.  As part of our continuing mission to provide you with exceptional heart care, we have created designated Provider Care Teams.  These Care Teams include your primary Cardiologist (physician) and Advanced Practice Providers (APPs -  Physician Assistants and Nurse Practitioners) who all work together to provide you with the care you need, when you need it.  You will need a follow up appointment in 3 months  Providers on your designated Care Team:   Murray Hodgkins, NP Christell Faith, PA-C Cadence Kathlen Mody, [place]  COVID-19 Vaccine Information can be found at: [email or web address] For questions related to vaccine distribution or appointments, please email [email or web address] or call [phone].

## 2021-07-17 ENCOUNTER — Other Ambulatory Visit: Payer: Self-pay

## 2021-07-17 ENCOUNTER — Encounter: Payer: Self-pay | Admitting: Internal Medicine

## 2021-07-17 ENCOUNTER — Ambulatory Visit (INDEPENDENT_AMBULATORY_CARE_PROVIDER_SITE_OTHER): Payer: Medicare Other | Admitting: Internal Medicine

## 2021-07-17 VITALS — BP 118/70 | HR 74 | Temp 98.6°F | Ht 68.4 in | Wt 150.2 lb

## 2021-07-17 DIAGNOSIS — Z940 Kidney transplant status: Principal | ICD-10-CM

## 2021-07-17 DIAGNOSIS — N186 End stage renal disease: Principal | ICD-10-CM

## 2021-07-17 DIAGNOSIS — I5032 Chronic diastolic (congestive) heart failure: Secondary | ICD-10-CM | POA: Diagnosis not present

## 2021-07-17 DIAGNOSIS — I10 Essential (primary) hypertension: Secondary | ICD-10-CM

## 2021-07-17 DIAGNOSIS — Z23 Encounter for immunization: Secondary | ICD-10-CM | POA: Diagnosis not present

## 2021-07-17 DIAGNOSIS — R5381 Other malaise: Secondary | ICD-10-CM

## 2021-07-17 DIAGNOSIS — N1832 Chronic kidney disease, stage 3b: Secondary | ICD-10-CM

## 2021-07-17 DIAGNOSIS — D631 Anemia in chronic kidney disease: Secondary | ICD-10-CM | POA: Diagnosis not present

## 2021-07-17 MED ORDER — SODIUM ZIRCONIUM CYCLOSILICATE 10 GRAM ORAL POWDER PACKET
PACK | Freq: Every day | ORAL | 5 refills | 30.00000 days | Status: CP | PRN
Start: 2021-07-17 — End: ?

## 2021-07-17 MED ORDER — SHINGRIX 50 MCG/0.5ML IM SUSR: 0.5000 mL | Freq: Once | INTRAMUSCULAR | 1 refills | Status: AC

## 2021-07-17 MED ORDER — TETANUS-DIPHTH-ACELL PERTUSSIS 5-2.5-18.5 LF-MCG/0.5 IM SUSP: 0.5000 mL | Freq: Once | INTRAMUSCULAR | 0 refills | Status: AC

## 2021-07-17 MED FILL — ERGOCALCIFEROL (VITAMIN D2) 1,250 MCG (50,000 UNIT) CAPSULE: ORAL | 28 days supply | Qty: 4 | Fill #1

## 2021-07-17 MED FILL — LOKELMA 10 GRAM ORAL POWDER PACKET: ORAL | 30 days supply | Qty: 30 | Fill #0

## 2021-07-17 MED FILL — ATORVASTATIN 20 MG TABLET: ORAL | 90 days supply | Qty: 90 | Fill #1

## 2021-07-17 MED FILL — PREGABALIN 75 MG CAPSULE: ORAL | 30 days supply | Qty: 90 | Fill #1

## 2021-07-17 MED FILL — SODIUM BICARBONATE 650 MG TABLET: ORAL | 30 days supply | Qty: 60 | Fill #3

## 2021-07-17 NOTE — Progress Notes (Signed)
Chief Complaint  Patient presents with   Follow-up   F/u  1. Doing well since hospitalization for CHF (no leg edema, sob, chest pain disc BL wt should be 150-mid 150s), covid, uti, anemia hbg 9.6 06/28/21 has not picked up Rx ferrous sulfate 325 tid rec her take with colace and vitamin C/low sugar oj, 07/10/21 sodium 144, K 5.0, GFR 50 s/p kidney transplant will have labs 07/25/21 with transplant asked to check K, na, iron, and urine culture spoke with Wynelle Bourgeois RN [facility] transplant and they will do these labs  2. Physical deconditioning doing well PT home 2x per week was strong enough to make herself dinner last night    Review of Systems  Constitutional:  Negative for weight loss.  HENT:  Negative for hearing loss.   Eyes:  Negative for blurred vision.  Respiratory:  Negative for shortness of breath.   Cardiovascular:  Negative for chest pain.  Gastrointestinal:  Negative for abdominal pain and blood in stool.  Genitourinary:  Negative for dysuria.  Musculoskeletal:  Negative for falls and joint pain.  Skin:  Negative for rash.  Neurological:  Negative for headaches.  Psychiatric/Behavioral:  Negative for depression.   Past Medical History:  Diagnosis Date   Anemia of chronic disease    Anginal pain ([place])    Carotid arterial disease ([place])    a. 02/2013 U/S: 40-59% bilat ICA stenosis.   Chronic constipation    Chronic diastolic CHF (congestive heart failure) ([place])    a. 10/2013 Echo [facility]): EF 55-60%, mod conc LVH, mod MR, mildly dil LA, mild Ao sclerosis w/o stenosis; b. 02/2016 Echo: EF 55-60%, no rwma, Gr DD, mild AS, mod to sev MR, mildly dil LA, PASP 3mmHg; c. 07/2017 Echo [facility]): EF >55%, mild to mod LVH, Gr2 DD, Ao scl, Mod MR, mod dil LA, nl RV fxn.   Colon polyps    COPD (chronic obstructive pulmonary disease) (HCC)    Coronary artery disease    a. 05/2013 NSTEMI/PCI: LM 20d, LAD min irregs, LCX small, nl, OM1 nl, RCA dom 26m (2.5x16 Promus DES), PDA1 80p; b. 04/2015 Cath: LM nl, LAD  1m, D1/2 min irregs, LCX 35p/m, OM2/3 min irregs, RCA patent mid stent, RPDA 70ost, RPLB1 30, RPLB2/3 min irregs, EF 55-65%--> Med Rx; c. 06/2017 [facility]): No ischemia/infarct, EF 64%.   COVID-19    06/2021   Diabetes mellitus    Diabetic neuropathy ([place])    Diabetic retinopathy ([place]) 05/28/2013   Hx bilat retinal detachment, proliferative diab retinopathy and bilat vitreous hemorrhage    Emphysema    ESRD on hemodialysis ([place])    a. [facility] in [place], [place] [phone]/Dr. [name], on a MWF schedule.  She started dialysis in Feb 2014.  Etiology of renal failure not known, likely diabetes.  Has a left upper arm AV graft.   History of bronchitis    Mar 2012   History of pneumonia    June 2012   History of tobacco abuse    a. Quit 2012.   Hyperlipidemia    Hypertension    Moderate to severe mitral insufficiency    a. 10/2013 Echo: EF 55-60%, mod MR; b. 02/2016 Echo: EF 55-60%, mod to sev MR directed posteriorly--felt to be dynamic -worse with volume overload; c. 07/2017 Echo [facility]): EF >55%. Mod MR.   Myocardial infarct ([place]) 05/2013   Peripheral vascular disease ([place])    Sickle cell trait ([place])    Thyroid nodule    [place] 06/2017  due to f/u US in 1 year    Valvular heart disease    echo 06/17/21 mild to moderate   Past Surgical History:  Procedure Laterality Date   A/V FISTULAGRAM Left 11/10/2017   Procedure: A/V FISTULAGRAM;  Surgeon: Katha Cabal, MD;  Location: [place] CV LAB;  Service: Cardiovascular;  Laterality: Left;   A/V SHUNTOGRAM Left 02/08/2019   Procedure: A/V SHUNTOGRAM;  Surgeon: Katha Cabal, MD;  Location: [place] CV LAB;  Service: Cardiovascular;  Laterality: Left;   ABDOMINAL HYSTERECTOMY     2000 for cysts on ovaries total no abnormal pap   CARDIAC CATHETERIZATION     CARDIAC CATHETERIZATION N/A 05/02/2015   Procedure: Left Heart Cath and Coronary Angiography;  Surgeon: [name], MD;  Location: [place] CV LAB;  Service:  Cardiovascular;  Laterality: N/A;   COLONOSCOPY WITH PROPOFOL N/A 07/08/2016   Procedure: COLONOSCOPY WITH PROPOFOL;  Surgeon: [name], MD;  Location: [facility] ENDOSCOPY;  Service: Endoscopy;  Laterality: N/A;   COLONOSCOPY WITH PROPOFOL N/A 08/20/2017   Procedure: COLONOSCOPY WITH PROPOFOL;  Surgeon: [name], MD;  Location: [facility] ENDOSCOPY;  Service: Gastroenterology;  Laterality: N/A;   colonscopy     CORONARY ANGIOPLASTY  05/28/2014   stent placement to the mid RCA   CYSTOURETHROSCOPY     [facility] urology with stent placement 11 or 05/2019 and stent removal 06/13/19    DILATION AND CURETTAGE OF UTERUS     several in the early 80's   ESOPHAGOGASTRODUODENOSCOPY     2012   ESOPHAGOGASTRODUODENOSCOPY (EGD) WITH PROPOFOL N/A 03/11/2018   Procedure: ESOPHAGOGASTRODUODENOSCOPY (EGD) WITH PROPOFOL;  Surgeon: [name], MD;  Location: [facility] ENDOSCOPY;  Service: Gastroenterology;  Laterality: N/A;   EYE SURGERY     bilateral laser 2012   EYE SURGERY     right   EYE SURGERY     x4 both eyes   GAS INSERTION  09/30/2011   Procedure: INSERTION OF GAS;  Surgeon: [name], MD;  Location: [place];  Service: Ophthalmology;  Laterality: Right;  [id number]   GAS/FLUID EXCHANGE  09/30/2011   Procedure: GAS/FLUID EXCHANGE;  Surgeon: [name], MD;  Location: [place];  Service: Ophthalmology;  Laterality: Right;   KIDNEY TRANSPLANT     03/30/19 Dr. Mickel Baas Thomas/Dr. Cristie Hem Zendel/Dr. Gilford Raid; [facility] transplant Dr. Horald Chestnut will be primary [phone] coordinator Sarah Wynkoop [phone]   KIDNEY TRANSPLANT     03/30/19 [facility]   LEFT HEART CATHETERIZATION WITH CORONARY ANGIOGRAM N/A 05/28/2013   Procedure: LEFT HEART CATHETERIZATION WITH CORONARY ANGIOGRAM;  Surgeon: [name], MD;  Location: [facility] CATH LAB;  Service: Cardiovascular;  Laterality: N/A;   PARS PLANA VITRECTOMY  04/22/2011   Procedure: PARS PLANA VITRECTOMY WITH 25 GAUGE;  Surgeon: [name], MD;  Location: [place];  Service:  Ophthalmology;  Laterality: Left;  membrane peel, endolaser, gas fluid exchange, silicone oil, repair of complex traction retinal detachment   PARS PLANA VITRECTOMY  09/30/2011   Procedure: PARS PLANA VITRECTOMY WITH 25 GAUGE;  Surgeon: [name], MD;  Location: [place];  Service: Ophthalmology;  Laterality: Right;  Endolaser; Repair of Complex Traction Retinal Detachment   PARS PLANA VITRECTOMY  02/24/2012   Procedure: PARS PLANA VITRECTOMY WITH 25 GAUGE;  Surgeon: [name], MD;  Location: [place];  Service: Ophthalmology;  Laterality: Left;   PTCA     SILICON OIL REMOVAL  37/09/8887   Procedure: SILICON OIL REMOVAL;  Surgeon: [name], MD;  Location: [place];  Service: Ophthalmology;  Laterality: Left;   THROMBECTOMY / ARTERIOVENOUS GRAFT REVISION     TUBAL LIGATION     1979   UPPER EXTREMITY ANGIOGRAPHY Left 02/08/2019   Procedure: UPPER EXTREMITY ANGIOGRAPHY;  Surgeon: Katha Cabal, MD;  Location: [place] CV LAB;  Service: Cardiovascular;  Laterality: Left;   Family History  Problem Relation Age of Onset   Stroke Mother    Heart attack Mother    Heart disease Mother    Colon cancer Father    Colon cancer Sister    Breast cancer Sister    Heart attack Brother    Stroke Brother    Diabetes Brother    Diabetes Brother    Diabetes Daughter    Renal Disease Daughter        on HD   Kidney failure Daughter        age 52 as of 12/03/20   Anesthesia problems Neg Hx    Hypotension Neg Hx    Malignant hyperthermia Neg Hx    Pseudochol deficiency Neg Hx    Social History   Socioeconomic History   Marital status: Single    Spouse name: Not on file   Number of children: 2   Years of education: Not on file   Highest education level: Not on file  Occupational History   Not on file  Tobacco Use   Smoking status: Former    Packs/day: 1.00    Years: 25.00    Pack years: 25.00    Types: Cigarettes    Quit date: 03/30/2019    Years since quitting: 2.3    Smokeless tobacco: Never  Vaping Use   Vaping Use: Never used  Substance and Sexual Activity   Alcohol use: No   Drug use: No   Sexual activity: Never  Other Topics Concern   Not on file  Social History Narrative   On disability.    Lives with son Brenton Grills   2 children (has son and daughter)      Son drives her since her vision has decreased   Social Determinants of Radio broadcast assistant Strain: Low Risk    Difficulty of Paying Living Expenses: Not hard at all  Food Insecurity: No Food Insecurity   Worried About Charity fundraiser in the Last Year: Never true   Arboriculturist in the Last Year: Never true  Transportation Needs: No Transportation Needs   Lack of Transportation (Medical): No   Lack of Transportation (Non-Medical): No  Physical Activity: Not on file  Stress: No Stress Concern Present   Feeling of Stress : Not at all  Social Connections: Unknown   Frequency of Communication with Friends and Family: More than three times a week   Frequency of Social Gatherings with Friends and Family: Not on file   Attends Religious Services: Not on Electrical engineer or Organizations: Not on file   Attends Archivist Meetings: Not on file   Marital Status: Not on file  Intimate Partner Violence: Not At Risk   Fear of Current or Ex-Partner: No   Emotionally Abused: No   Physically Abused: No   Sexually Abused: No   Current Meds  Medication Sig   Tdap (BOOSTRIX) 5-2.5-18.5 LF-MCG/0.5 injection Inject 0.5 mLs into the muscle once for 1 dose.   Zoster Vaccine Adjuvanted [facility]) injection Inject 0.5 mLs into the muscle once  for 1 dose. 2 doses 2nd dose <6 months   Allergies  Allergen Reactions   Hydrocodone Hives   Lisinopril     Unknown reaction    Recent Results (from the past 2160 hour(s))  Blood gas, venous     Status: Abnormal   Collection Time: 05/17/21 10:08 PM  Result Value Ref Range   pH, Ven 7.37 7.250 - 7.430   pCO2, Ven 38  (L) 44.0 - 60.0 mmHg   pO2, Ven 43.0 32.0 - 45.0 mmHg   Bicarbonate 22.0 20.0 - 28.0 mmol/L   Acid-base deficit 3.0 (H) 0.0 - 2.0 mmol/L   O2 Saturation 77.0 %   Patient temperature 37.0    Collection site VEIN    Sample type VENOUS     Comment: Performed at [facility], [address] [place], [place] [address]  CBG monitoring, ED     Status: Abnormal   Collection Time: 05/17/21 10:13 PM  Result Value Ref Range   Glucose-Capillary 352 (H) 70 - 99 mg/dL    Comment: Glucose reference range applies only to samples taken after fasting for at least 8 hours.   Comment 1 Notify RN    Comment 2 Document in Chart   Basic metabolic panel     Status: Abnormal   Collection Time: 05/17/21 10:14 PM  Result Value Ref Range   Sodium 129 (L) 135 - 145 mmol/L   Potassium 4.6 3.5 - 5.1 mmol/L   Chloride 102 98 - 111 mmol/L   CO2 22 22 - 32 mmol/L   Glucose, Bld 339 (H) 70 - 99 mg/dL    Comment: Glucose reference range applies only to samples taken after fasting for at least 8 hours.   BUN 22 8 - 23 mg/dL   Creatinine, Ser 1.62 (H) 0.44 - 1.00 mg/dL   Calcium 9.3 8.9 - 10.3 mg/dL   GFR, Estimated 35 (L) >60 mL/min    Comment: (NOTE) Calculated using the CKD-EPI Creatinine Equation (2021)    Anion gap 5 5 - 15    Comment: Performed at [facility], [address] [place], [place] [address]  CBC     Status: Abnormal   Collection Time: 05/17/21 10:14 PM  Result Value Ref Range   WBC 6.1 4.0 - 10.5 K/uL   RBC 3.02 (L) 3.87 - 5.11 MIL/uL   Hemoglobin 9.6 (L) 12.0 - 15.0 g/dL   HCT 28.3 (L) 36.0 - 46.0 %   MCV 93.7 80.0 - 100.0 fL   MCH 31.8 26.0 - 34.0 pg   MCHC 33.9 30.0 - 36.0 g/dL   RDW 14.4 11.5 - 15.5 %   Platelets 158 150 - 400 K/uL   nRBC 0.0 0.0 - 0.2 %    Comment: Performed at [facility], [address] [place], [place] [address]  Resp Panel by RT-PCR (Flu A&B, Covid) Nasopharyngeal Swab     Status: None   Collection Time: 05/17/21 10:14 PM    Specimen: Nasopharyngeal Swab; Nasopharyngeal(NP) swabs in vial transport medium  Result Value Ref Range   SARS Coronavirus 2 by RT PCR NEGATIVE NEGATIVE    Comment: (NOTE) SARS-CoV-2 target nucleic acids are NOT DETECTED.  The SARS-CoV-2 RNA is generally detectable in upper respiratory specimens during the acute phase of infection. The lowest concentration of SARS-CoV-2 viral copies this assay can detect is 138 copies/mL. A negative result does not preclude SARS-Cov-2 infection and should not be used as the sole basis for treatment or other patient  management decisions. A negative result may occur with  improper specimen collection/handling, submission of specimen other than nasopharyngeal swab, presence of viral mutation(s) within the areas targeted by this assay, and inadequate number of viral copies(<138 copies/mL). A negative result must be combined with clinical observations, patient history, and epidemiological information. The expected result is Negative.  Fact Sheet for Patients:  EntrepreneurPulse.com.au  Fact Sheet for Healthcare Providers:  IncredibleEmployment.be  This test is no t yet approved or cleared by the [place] FDA and  has been authorized for detection and/or diagnosis of SARS-CoV-2 by FDA under an Emergency Use Authorization (EUA). This EUA will remain  in effect (meaning this test can be used) for the duration of the COVID-19 declaration under Section 564(b)(1) of the Act, 21 U.S.C.section 360bbb-3(b)(1), unless the authorization is terminated  or revoked sooner.       Influenza A by PCR NEGATIVE NEGATIVE   Influenza B by PCR NEGATIVE NEGATIVE    Comment: (NOTE) The Xpert Xpress SARS-CoV-2/FLU/RSV plus assay is intended as an aid in the diagnosis of influenza from Nasopharyngeal swab specimens and should not be used as a sole basis for treatment. Nasal washings and aspirates are unacceptable for Xpert Xpress  SARS-CoV-2/FLU/RSV testing.  Fact Sheet for Patients: EntrepreneurPulse.com.au  Fact Sheet for Healthcare Providers: IncredibleEmployment.be  This test is not yet approved or cleared by the [place] FDA and has been authorized for detection and/or diagnosis of SARS-CoV-2 by FDA under an Emergency Use Authorization (EUA). This EUA will remain in effect (meaning this test can be used) for the duration of the COVID-19 declaration under Section 564(b)(1) of the Act, 21 U.S.C. section 360bbb-3(b)(1), unless the authorization is terminated or revoked.  Performed at [facility], [place], [place] [address]   Troponin I (High Sensitivity)     Status: Abnormal   Collection Time: 05/17/21 10:14 PM  Result Value Ref Range   Troponin I (High Sensitivity) 20 (H) <18 ng/L    Comment: (NOTE) Elevated high sensitivity troponin I (hsTnI) values and significant  changes across serial measurements may suggest ACS but many other  chronic and acute conditions are known to elevate hsTnI results.  Refer to the "Links" section for chest pain algorithms and additional  guidance. Performed at [facility], [place], [place] [address]   Troponin I (High Sensitivity)     Status: Abnormal   Collection Time: 05/18/21  2:25 AM  Result Value Ref Range   Troponin I (High Sensitivity) 27 (H) <18 ng/L    Comment: (NOTE) Elevated high sensitivity troponin I (hsTnI) values and significant  changes across serial measurements may suggest ACS but many other  chronic and acute conditions are known to elevate hsTnI results.  Refer to the "Links" section for chest pain algorithms and additional  guidance. Performed at [facility], [address] [place], [place] [address]   CBG monitoring, ED     Status: Abnormal   Collection Time: 05/18/21  5:21 AM  Result Value Ref Range   Glucose-Capillary 204 (H) 70 - 99  mg/dL    Comment: Glucose reference range applies only to samples taken after fasting for at least 8 hours.  CBG monitoring, ED     Status: Abnormal   Collection Time: 05/18/21  7:51 AM  Result Value Ref Range   Glucose-Capillary 122 (H) 70 - 99 mg/dL    Comment: Glucose reference range applies only to samples taken after fasting for at least  8 hours.  ECHOCARDIOGRAM COMPLETE     Status: None   Collection Time: 05/18/21  1:49 PM  Result Value Ref Range   BP 96/53 mmHg   Ao pk vel 2.77 m/s   AV Area VTI 0.83 cm2   AR max vel 1.02 cm2   AV Mean grad 14.5 mmHg   AV Peak grad 30.6 mmHg   AV Area mean vel 0.87 cm2   Area-P 1/2 2.68 cm2   P 1/2 time 408 msec  Glucose, capillary     Status: Abnormal   Collection Time: 05/18/21  7:47 PM  Result Value Ref Range   Glucose-Capillary 182 (H) 70 - 99 mg/dL    Comment: Glucose reference range applies only to samples taken after fasting for at least 8 hours.  Sodium     Status: Abnormal   Collection Time: 05/18/21  9:53 PM  Result Value Ref Range   Sodium 134 (L) 135 - 145 mmol/L    Comment: Performed at [facility], [address] [place], [place] [address]  Glucose, capillary     Status: Abnormal   Collection Time: 05/19/21 12:12 AM  Result Value Ref Range   Glucose-Capillary 207 (H) 70 - 99 mg/dL    Comment: Glucose reference range applies only to samples taken after fasting for at least 8 hours.  Glucose, capillary     Status: None   Collection Time: 05/19/21  4:21 AM  Result Value Ref Range   Glucose-Capillary 81 70 - 99 mg/dL    Comment: Glucose reference range applies only to samples taken after fasting for at least 8 hours.  Hemoglobin A1c     Status: Abnormal   Collection Time: 05/19/21  6:02 AM  Result Value Ref Range   Hgb A1c MFr Bld 7.2 (H) 4.8 - 5.6 %    Comment: (NOTE)         Prediabetes: 5.7 - 6.4         Diabetes: >6.4         Glycemic control for adults with diabetes: <7.0    Mean Plasma Glucose 160  mg/dL    Comment: (NOTE) Performed At: [facility] [place], [place] [id number] Rush Farmer MD [id number]   HIV Antibody (routine testing w rflx)     Status: None   Collection Time: 05/19/21  6:02 AM  Result Value Ref Range   HIV Screen 4th Generation wRfx Non Reactive Non Reactive    Comment: Performed at [facility], [place] [address], [place], [place] [address]  Magnesium     Status: Abnormal   Collection Time: 05/19/21  6:02 AM  Result Value Ref Range   Magnesium 2.5 (H) 1.7 - 2.4 mg/dL    Comment: Performed at [facility], [address] [place], [place] [address]  Basic metabolic panel     Status: Abnormal   Collection Time: 05/19/21  6:02 AM  Result Value Ref Range   Sodium 132 (L) 135 - 145 mmol/L   Potassium 4.4 3.5 - 5.1 mmol/L   Chloride 104 98 - 111 mmol/L   CO2 21 (L) 22 - 32 mmol/L   Glucose, Bld 190 (H) 70 - 99 mg/dL    Comment: Glucose reference range applies only to samples taken after fasting for at least 8 hours.   BUN 20 8 - 23 mg/dL   Creatinine, Ser 1.59 (H) 0.44 - 1.00 mg/dL   Calcium 9.3 8.9 - 10.3 mg/dL   GFR,  Estimated 36 (L) >60 mL/min    Comment: (NOTE) Calculated using the CKD-EPI Creatinine Equation (2021)    Anion gap 7 5 - 15    Comment: Performed at [facility], [address] [place], [place] [address]  TSH     Status: None   Collection Time: 05/19/21  6:02 AM  Result Value Ref Range   TSH 1.976 0.350 - 4.500 uIU/mL    Comment: Performed by a 3rd Generation assay with a functional sensitivity of <=0.01 uIU/mL. Performed at [facility], [address] [place], [place] [address]   Glucose, capillary     Status: Abnormal   Collection Time: 05/19/21  8:14 AM  Result Value Ref Range   Glucose-Capillary 228 (H) 70 - 99 mg/dL    Comment: Glucose reference range applies only to samples taken after fasting for at least 8 hours.  Glucose, capillary     Status: Abnormal    Collection Time: 05/19/21 11:23 AM  Result Value Ref Range   Glucose-Capillary 128 (H) 70 - 99 mg/dL    Comment: Glucose reference range applies only to samples taken after fasting for at least 8 hours.  Comprehensive metabolic panel     Status: Abnormal   Collection Time: 05/21/21  3:06 PM  Result Value Ref Range   Sodium 133 (L) 135 - 145 mEq/L   Potassium 4.8 3.5 - 5.1 mEq/L   Chloride 104 96 - 112 mEq/L   CO2 25 19 - 32 mEq/L   Glucose, Bld 174 (H) 70 - 99 mg/dL   BUN 16 6 - 23 mg/dL   Creatinine, Ser 1.56 (H) 0.40 - 1.20 mg/dL   Total Bilirubin 0.5 0.2 - 1.2 mg/dL   Alkaline Phosphatase 112 39 - 117 U/L   AST 16 0 - 37 U/L   ALT 10 0 - 35 U/L   Total Protein 6.0 6.0 - 8.3 g/dL   Albumin 3.4 (L) 3.5 - 5.2 g/dL   GFR 35.09 (L) >60.00 mL/min    Comment: Calculated using the CKD-EPI Creatinine Equation (2021)   Calcium 9.5 8.4 - 10.5 mg/dL  IBC + Ferritin     Status: Abnormal   Collection Time: 05/21/21  3:06 PM  Result Value Ref Range   Iron 35 (L) 42 - 145 ug/dL   Transferrin 157.0 (L) 212.0 - 360.0 mg/dL   Saturation Ratios 15.9 (L) 20.0 - 50.0 %   Ferritin 518.0 (H) 10.0 - 291.0 ng/mL   TIBC 219.8 (L) 250.0 - 450.0 mcg/dL  CBC with Differential/Platelet     Status: Abnormal   Collection Time: 05/21/21  3:06 PM  Result Value Ref Range   WBC 6.3 4.0 - 10.5 K/uL   RBC 3.17 (L) 3.87 - 5.11 Mil/uL   Hemoglobin 10.1 (L) 12.0 - 15.0 g/dL   HCT 29.8 (L) 36.0 - 46.0 %   MCV 94.0 78.0 - 100.0 fl   MCHC 33.8 30.0 - 36.0 g/dL   RDW 15.4 11.5 - 15.5 %   Platelets 196.0 150.0 - 400.0 K/uL   Neutrophils Relative % 74.2 43.0 - 77.0 %   Lymphocytes Relative 16.4 12.0 - 46.0 %   Monocytes Relative 4.3 3.0 - 12.0 %   Eosinophils Relative 4.3 0.0 - 5.0 %   Basophils Relative 0.8 0.0 - 3.0 %   Neutro Abs 4.7 1.4 - 7.7 K/uL   Lymphs Abs 1.0 0.7 - 4.0 K/uL   Monocytes Absolute 0.3 0.1 - 1.0 K/uL   Eosinophils Absolute 0.3  0.0 - 0.7 K/uL   Basophils Absolute 0.0 0.0 - 0.1 K/uL   Magnesium     Status: None   Collection Time: 05/21/21  3:06 PM  Result Value Ref Range   Magnesium 2.3 1.5 - 2.5 mg/dL  Troponin I (High Sensitivity)     Status: None   Collection Time: 05/21/21  3:07 PM  Result Value Ref Range   High Sens Troponin I 17 2 - 17 ng/L  Basic metabolic panel     Status: Abnormal   Collection Time: 05/28/21  3:37 AM  Result Value Ref Range   Sodium 135 135 - 145 mmol/L   Potassium 5.2 (H) 3.5 - 5.1 mmol/L   Chloride 109 98 - 111 mmol/L   CO2 22 22 - 32 mmol/L   Glucose, Bld 146 (H) 70 - 99 mg/dL    Comment: Glucose reference range applies only to samples taken after fasting for at least 8 hours.   BUN 10 8 - 23 mg/dL   Creatinine, Ser 1.32 (H) 0.44 - 1.00 mg/dL   Calcium 9.2 8.9 - 10.3 mg/dL   GFR, Estimated 45 (L) >60 mL/min    Comment: (NOTE) Calculated using the CKD-EPI Creatinine Equation (2021)    Anion gap 4 (L) 5 - 15    Comment: Performed at [facility], [address] [place], [place] [address]  CBC     Status: Abnormal   Collection Time: 05/28/21  3:37 AM  Result Value Ref Range   WBC 8.9 4.0 - 10.5 K/uL   RBC 2.68 (L) 3.87 - 5.11 MIL/uL   Hemoglobin 8.6 (L) 12.0 - 15.0 g/dL   HCT 26.5 (L) 36.0 - 46.0 %   MCV 98.9 80.0 - 100.0 fL   MCH 32.1 26.0 - 34.0 pg   MCHC 32.5 30.0 - 36.0 g/dL   RDW 15.6 (H) 11.5 - 15.5 %   Platelets 186 150 - 400 K/uL   nRBC 0.0 0.0 - 0.2 %    Comment: Performed at [facility], [place], [place] [address]  Troponin I (High Sensitivity)     Status: Abnormal   Collection Time: 05/28/21  3:37 AM  Result Value Ref Range   Troponin I (High Sensitivity) 18 (H) <18 ng/L    Comment: (NOTE) Elevated high sensitivity troponin I (hsTnI) values and significant  changes across serial measurements may suggest ACS but many other  chronic and acute conditions are known to elevate hsTnI results.  Refer to the "Links" section for chest pain algorithms and additional   guidance. Performed at [facility], [place], [place] [address]   Troponin I (High Sensitivity)     Status: Abnormal   Collection Time: 05/28/21  8:18 AM  Result Value Ref Range   Troponin I (High Sensitivity) 20 (H) <18 ng/L    Comment: (NOTE) Elevated high sensitivity troponin I (hsTnI) values and significant  changes across serial measurements may suggest ACS but many other  chronic and acute conditions are known to elevate hsTnI results.  Refer to the "Links" section for chest pain algorithms and additional  guidance. Performed at [facility], [address] [place],  [address]   Resp Panel by RT-PCR (Flu A&B, Covid) Nasopharyngeal Swab     Status: None   Collection Time: 05/28/21 11:28 AM   Specimen: Nasopharyngeal Swab; Nasopharyngeal(NP) swabs in vial transport medium  Result Value Ref Range   SARS Coronavirus 2 by RT PCR NEGATIVE NEGATIVE  Comment: (NOTE) SARS-CoV-2 target nucleic acids are NOT DETECTED.  The SARS-CoV-2 RNA is generally detectable in upper respiratory specimens during the acute phase of infection. The lowest concentration of SARS-CoV-2 viral copies this assay can detect is 138 copies/mL. A negative result does not preclude SARS-Cov-2 infection and should not be used as the sole basis for treatment or other patient management decisions. A negative result may occur with  improper specimen collection/handling, submission of specimen other than nasopharyngeal swab, presence of viral mutation(s) within the areas targeted by this assay, and inadequate number of viral copies(<138 copies/mL). A negative result must be combined with clinical observations, patient history, and epidemiological information. The expected result is Negative.  Fact Sheet for Patients:  EntrepreneurPulse.com.au  Fact Sheet for Healthcare Providers:  IncredibleEmployment.be  This test is no t yet  approved or cleared by the [place] FDA and  has been authorized for detection and/or diagnosis of SARS-CoV-2 by FDA under an Emergency Use Authorization (EUA). This EUA will remain  in effect (meaning this test can be used) for the duration of the COVID-19 declaration under Section 564(b)(1) of the Act, 21 U.S.C.section 360bbb-3(b)(1), unless the authorization is terminated  or revoked sooner.       Influenza A by PCR NEGATIVE NEGATIVE   Influenza B by PCR NEGATIVE NEGATIVE    Comment: (NOTE) The Xpert Xpress SARS-CoV-2/FLU/RSV plus assay is intended as an aid in the diagnosis of influenza from Nasopharyngeal swab specimens and should not be used as a sole basis for treatment. Nasal washings and aspirates are unacceptable for Xpert Xpress SARS-CoV-2/FLU/RSV testing.  Fact Sheet for Patients: EntrepreneurPulse.com.au  Fact Sheet for Healthcare Providers: IncredibleEmployment.be  This test is not yet approved or cleared by the [place] FDA and has been authorized for detection and/or diagnosis of SARS-CoV-2 by FDA under an Emergency Use Authorization (EUA). This EUA will remain in effect (meaning this test can be used) for the duration of the COVID-19 declaration under Section 564(b)(1) of the Act, 21 U.S.C. section 360bbb-3(b)(1), unless the authorization is terminated or revoked.  Performed at [facility], [address] [place], [place] [address]   Basic metabolic panel     Status: Abnormal   Collection Time: 06/07/21  4:14 PM  Result Value Ref Range   Sodium 129 (L) 135 - 145 mmol/L   Potassium 5.0 3.5 - 5.1 mmol/L   Chloride 103 98 - 111 mmol/L   CO2 20 (L) 22 - 32 mmol/L   Glucose, Bld 84 70 - 99 mg/dL    Comment: Glucose reference range applies only to samples taken after fasting for at least 8 hours.   BUN 14 8 - 23 mg/dL   Creatinine, Ser 1.45 (H) 0.44 - 1.00 mg/dL   Calcium 8.6 (L) 8.9 - 10.3 mg/dL    GFR, Estimated 41 (L) >60 mL/min    Comment: (NOTE) Calculated using the CKD-EPI Creatinine Equation (2021)    Anion gap 6 5 - 15    Comment: Performed at [facility], [address] [place], [place] [address]  CBC     Status: Abnormal   Collection Time: 06/07/21  4:14 PM  Result Value Ref Range   WBC 6.5 4.0 - 10.5 K/uL   RBC 2.80 (L) 3.87 - 5.11 MIL/uL   Hemoglobin 8.9 (L) 12.0 - 15.0 g/dL   HCT 26.3 (L) 36.0 - 46.0 %   MCV 93.9 80.0 - 100.0 fL   MCH 31.8 26.0 - 34.0 pg  MCHC 33.8 30.0 - 36.0 g/dL   RDW 15.9 (H) 11.5 - 15.5 %   Platelets 178 150 - 400 K/uL   nRBC 0.0 0.0 - 0.2 %    Comment: Performed at [facility], [place], [place] [address]  Troponin I (High Sensitivity)     Status: Abnormal   Collection Time: 06/07/21  4:14 PM  Result Value Ref Range   Troponin I (High Sensitivity) 23 (H) <18 ng/L    Comment: (NOTE) Elevated high sensitivity troponin I (hsTnI) values and significant  changes across serial measurements may suggest ACS but many other  chronic and acute conditions are known to elevate hsTnI results.  Refer to the "Links" section for chest pain algorithms and additional  guidance. Performed at [facility], [place], [place] [address]   Troponin I (High Sensitivity)     Status: Abnormal   Collection Time: 06/07/21  5:54 PM  Result Value Ref Range   Troponin I (High Sensitivity) 24 (H) <18 ng/L    Comment: (NOTE) Elevated high sensitivity troponin I (hsTnI) values and significant  changes across serial measurements may suggest ACS but many other  chronic and acute conditions are known to elevate hsTnI results.  Refer to the "Links" section for chest pain algorithms and additional  guidance. Performed at [facility], [address] [place], [place] [address]    Objective  Body mass index is 22.57 kg/m. Wt Readings from Last 3 Encounters:  07/17/21 150 lb 3.2 oz (68.1 kg)   07/16/21 151 lb 2 oz (68.5 kg)  07/03/21 172 lb (78 kg)   Temp Readings from Last 3 Encounters:  07/17/21 98.6 F (37 C) (Oral)  05/28/21 98.7 F (37.1 C) (Oral)  05/22/21 97.8 F (36.6 C) (Oral)   BP Readings from Last 3 Encounters:  07/17/21 118/70  07/16/21 (!) 120/40  07/03/21 109/66   Pulse Readings from Last 3 Encounters:  07/17/21 74  07/16/21 79  06/07/21 82    Physical Exam Vitals and nursing note reviewed.  Constitutional:      Appearance: Normal appearance. She is well-developed and well-groomed.  HENT:     Head: Normocephalic and atraumatic.  Eyes:     Conjunctiva/sclera: Conjunctivae normal.     Pupils: Pupils are equal, round, and reactive to light.  Cardiovascular:     Rate and Rhythm: Normal rate and regular rhythm.     Heart sounds: Normal heart sounds. No murmur heard. Pulmonary:     Effort: Pulmonary effort is normal.     Breath sounds: Normal breath sounds.  Abdominal:     General: Abdomen is flat. Bowel sounds are normal.     Tenderness: There is no abdominal tenderness.  Musculoskeletal:        General: No tenderness.  Skin:    General: Skin is warm and dry.  Neurological:     General: No focal deficit present.     Mental Status: She is alert and oriented to person, place, and time. Mental status is at baseline.     Cranial Nerves: Cranial nerves 2-12 are intact.     Gait: Gait is intact.  Psychiatric:        Attention and Perception: Attention and perception normal.        Mood and Affect: Mood and affect normal.        Speech: Speech normal.        Behavior: Behavior normal. Behavior is cooperative.  Thought Content: Thought content normal.        Cognition and Memory: Cognition and memory normal.        Judgment: Judgment normal.    Assessment  Plan  Primary hypertension CKD 3B Stage 3b chronic kidney disease ([place]) Anemia in stage 3b chronic kidney disease (HCC) Cont norvasc 10 mg qd, clonidine 0.2 mg bid and prn 0.1 if  sbp >160  Lasix 40 mg bid  Physical deconditioning Doing home PT 2x per week  Chronic diastolic CHF (congestive heart failure) (HCC)  Stable cont meds  Dry wt goal 150-mid 150s   HM A1c 7.2 05/19/21 Flu shot utd  pna 23 utd  prevnar utd covid pfizer  5/5 s/p renal transplant    Tetanus UTD 06/09/10 consider in future update this sent rx pharmacy 04/08/21   Hep B immune had engerix as well 4 doses 2014-2015  Consider shingrix and Tdap given Rx today   HCV neg 04/25/19 Hep B sAb 98.50 03/28/19 HIV neg 03/28/19     Pap neg 11/13/16 neg HPV s/p hysterectomy  -will ask in future if h/o abnormal pap if so will do pap if not d/c paps    mammo 12/2020 breast calcifications needs repeat dx b/l in 6 months orders in  Sch 08/05/21 b/l diagnostic   h/o multiple polyps colonoscopy 08/20/17 repeat in 3 years [facility] or [facility] can do this  -EGD 03/11/18 neg gastritis  -EGD ulcers 04/2019 neg HSV/H pylori [facility] egd  08/25/17 several polyps due 08/25/20 tubular adenoma  Referred Dr. [name] appt 06/2021 and Dr. Marius Ditch 05/22/21    Former smoker x 25-30 years <1 ppd  CTA chest 01/12/18 moderate b/l pleural effusions and CAD and aortic atherosclerosis, no nodules   ct chest had 09/12/20 [facility]    Thyroid nodules/parathyroid mass   [place] 06/16/17 right upper thyroid nodule ? Parathyroid mass 1.1 cm and left mid nodule f/u annually last 02/01/20    Thyroid US 02/01/20 [facility]  Additional similar solid cystic or cystic nodules identified, without suspicious features.  Incidentally noted nonocclusive atherosclerotic plaque noted in the right common carotid artery.  IMPRESSION:  No suspicious thyroid nodules identified.      GI appt [facility] 06/20/19 f/u esophageal ulcers Dr. [name] Cards Dr. Rockey Situ Renal [facility] and transplant s/p kidney transplant 03/28/20  [facility] eye exam sch 07/2021    Provider: Dr. Olivia Mackie McLean-Scocuzza-Internal Medicine

## 2021-07-17 NOTE — Patient Instructions (Addendum)
Call and make eye doctor appts and have them fax me the notes [phone]   07/03/2021 Telephone [place]   [address]   [place], [place] [address]   [phone]   Kary Kos, RN   Kidney Post Transplant, [facility]   [phone] (Work)   Kidney TXP Follow-up

## 2021-07-18 DIAGNOSIS — Z794 Long term (current) use of insulin: Principal | ICD-10-CM

## 2021-07-18 DIAGNOSIS — E1122 Type 2 diabetes mellitus with diabetic chronic kidney disease: Principal | ICD-10-CM

## 2021-07-18 DIAGNOSIS — I509 Heart failure, unspecified: Principal | ICD-10-CM

## 2021-07-18 DIAGNOSIS — U071 COVID-19: Secondary | ICD-10-CM | POA: Diagnosis not present

## 2021-07-18 DIAGNOSIS — Z7984 Long term (current) use of oral hypoglycemic drugs: Secondary | ICD-10-CM | POA: Diagnosis not present

## 2021-07-18 DIAGNOSIS — Z79621 Long term (current) use of calcineurin inhibitor: Secondary | ICD-10-CM | POA: Diagnosis not present

## 2021-07-18 DIAGNOSIS — E785 Hyperlipidemia, unspecified: Secondary | ICD-10-CM | POA: Diagnosis not present

## 2021-07-18 DIAGNOSIS — I5033 Acute on chronic diastolic (congestive) heart failure: Secondary | ICD-10-CM | POA: Diagnosis not present

## 2021-07-18 DIAGNOSIS — Z940 Kidney transplant status: Secondary | ICD-10-CM | POA: Diagnosis not present

## 2021-07-18 DIAGNOSIS — J309 Allergic rhinitis, unspecified: Secondary | ICD-10-CM | POA: Diagnosis not present

## 2021-07-18 DIAGNOSIS — M549 Dorsalgia, unspecified: Secondary | ICD-10-CM | POA: Diagnosis not present

## 2021-07-18 DIAGNOSIS — E1140 Type 2 diabetes mellitus with diabetic neuropathy, unspecified: Secondary | ICD-10-CM | POA: Diagnosis not present

## 2021-07-18 DIAGNOSIS — G4733 Obstructive sleep apnea (adult) (pediatric): Secondary | ICD-10-CM | POA: Diagnosis not present

## 2021-07-18 DIAGNOSIS — N1832 Chronic kidney disease, stage 3b: Secondary | ICD-10-CM | POA: Diagnosis not present

## 2021-07-18 DIAGNOSIS — D84821 Immunodeficiency due to drugs: Secondary | ICD-10-CM | POA: Diagnosis not present

## 2021-07-18 DIAGNOSIS — I6523 Occlusion and stenosis of bilateral carotid arteries: Secondary | ICD-10-CM | POA: Diagnosis not present

## 2021-07-18 DIAGNOSIS — E1151 Type 2 diabetes mellitus with diabetic peripheral angiopathy without gangrene: Secondary | ICD-10-CM | POA: Diagnosis not present

## 2021-07-18 DIAGNOSIS — I2510 Atherosclerotic heart disease of native coronary artery without angina pectoris: Secondary | ICD-10-CM | POA: Diagnosis not present

## 2021-07-18 DIAGNOSIS — E11319 Type 2 diabetes mellitus with unspecified diabetic retinopathy without macular edema: Secondary | ICD-10-CM | POA: Diagnosis not present

## 2021-07-18 DIAGNOSIS — D631 Anemia in chronic kidney disease: Secondary | ICD-10-CM | POA: Diagnosis not present

## 2021-07-18 DIAGNOSIS — I080 Rheumatic disorders of both mitral and aortic valves: Secondary | ICD-10-CM | POA: Diagnosis not present

## 2021-07-18 DIAGNOSIS — I272 Pulmonary hypertension, unspecified: Secondary | ICD-10-CM | POA: Diagnosis not present

## 2021-07-18 DIAGNOSIS — J449 Chronic obstructive pulmonary disease, unspecified: Secondary | ICD-10-CM | POA: Diagnosis not present

## 2021-07-18 DIAGNOSIS — E875 Hyperkalemia: Secondary | ICD-10-CM | POA: Diagnosis not present

## 2021-07-18 DIAGNOSIS — K219 Gastro-esophageal reflux disease without esophagitis: Secondary | ICD-10-CM | POA: Diagnosis not present

## 2021-07-18 DIAGNOSIS — I130 Hypertensive heart and chronic kidney disease with heart failure and stage 1 through stage 4 chronic kidney disease, or unspecified chronic kidney disease: Secondary | ICD-10-CM | POA: Diagnosis not present

## 2021-07-18 DIAGNOSIS — G8929 Other chronic pain: Secondary | ICD-10-CM | POA: Diagnosis not present

## 2021-07-19 NOTE — Telephone Encounter (Signed)
Called Patient and she is rescheduled to go back 08/05/21. Patient states she now knows where to go

## 2021-07-23 DIAGNOSIS — Z794 Long term (current) use of insulin: Principal | ICD-10-CM

## 2021-07-23 DIAGNOSIS — I509 Heart failure, unspecified: Principal | ICD-10-CM

## 2021-07-23 DIAGNOSIS — E1122 Type 2 diabetes mellitus with diabetic chronic kidney disease: Principal | ICD-10-CM

## 2021-07-24 ENCOUNTER — Ambulatory Visit: Admit: 2021-07-24 | Discharge: 2021-07-24 | Payer: MEDICARE | Attending: Nephrology | Primary: Nephrology

## 2021-07-24 ENCOUNTER — Ambulatory Visit: Admit: 2021-07-24 | Discharge: 2021-07-24 | Payer: MEDICARE

## 2021-07-24 DIAGNOSIS — Z940 Kidney transplant status: Principal | ICD-10-CM

## 2021-07-24 DIAGNOSIS — N186 End stage renal disease: Principal | ICD-10-CM

## 2021-07-24 DIAGNOSIS — K222 Esophageal obstruction: Secondary | ICD-10-CM | POA: Diagnosis not present

## 2021-07-24 DIAGNOSIS — F17210 Nicotine dependence, cigarettes, uncomplicated: Secondary | ICD-10-CM | POA: Diagnosis not present

## 2021-07-24 DIAGNOSIS — I10 Essential (primary) hypertension: Secondary | ICD-10-CM | POA: Diagnosis not present

## 2021-07-24 DIAGNOSIS — E1122 Type 2 diabetes mellitus with diabetic chronic kidney disease: Secondary | ICD-10-CM | POA: Diagnosis not present

## 2021-07-24 DIAGNOSIS — I2510 Atherosclerotic heart disease of native coronary artery without angina pectoris: Secondary | ICD-10-CM | POA: Diagnosis not present

## 2021-07-24 DIAGNOSIS — M25552 Pain in left hip: Secondary | ICD-10-CM | POA: Diagnosis not present

## 2021-07-24 DIAGNOSIS — Z6824 Body mass index (BMI) 24.0-24.9, adult: Secondary | ICD-10-CM | POA: Diagnosis not present

## 2021-07-24 DIAGNOSIS — I739 Peripheral vascular disease, unspecified: Secondary | ICD-10-CM | POA: Diagnosis not present

## 2021-07-24 DIAGNOSIS — R200 Anesthesia of skin: Secondary | ICD-10-CM | POA: Diagnosis not present

## 2021-07-24 DIAGNOSIS — E611 Iron deficiency: Secondary | ICD-10-CM | POA: Diagnosis not present

## 2021-07-24 DIAGNOSIS — E1151 Type 2 diabetes mellitus with diabetic peripheral angiopathy without gangrene: Secondary | ICD-10-CM | POA: Diagnosis not present

## 2021-07-24 DIAGNOSIS — E041 Nontoxic single thyroid nodule: Secondary | ICD-10-CM | POA: Diagnosis not present

## 2021-07-24 DIAGNOSIS — Z8616 Personal history of COVID-19: Secondary | ICD-10-CM | POA: Diagnosis not present

## 2021-07-24 DIAGNOSIS — D8489 Other immunodeficiencies: Secondary | ICD-10-CM | POA: Diagnosis not present

## 2021-07-24 DIAGNOSIS — R251 Tremor, unspecified: Secondary | ICD-10-CM | POA: Diagnosis not present

## 2021-07-24 DIAGNOSIS — D696 Thrombocytopenia, unspecified: Secondary | ICD-10-CM | POA: Diagnosis not present

## 2021-07-24 DIAGNOSIS — Z79899 Other long term (current) drug therapy: Secondary | ICD-10-CM | POA: Diagnosis not present

## 2021-07-24 DIAGNOSIS — I5032 Chronic diastolic (congestive) heart failure: Secondary | ICD-10-CM | POA: Diagnosis not present

## 2021-07-24 DIAGNOSIS — E1142 Type 2 diabetes mellitus with diabetic polyneuropathy: Secondary | ICD-10-CM | POA: Diagnosis not present

## 2021-07-24 DIAGNOSIS — I951 Orthostatic hypotension: Secondary | ICD-10-CM | POA: Diagnosis not present

## 2021-07-24 DIAGNOSIS — D849 Immunodeficiency, unspecified: Secondary | ICD-10-CM | POA: Diagnosis not present

## 2021-07-24 DIAGNOSIS — Z8619 Personal history of other infectious and parasitic diseases: Secondary | ICD-10-CM | POA: Diagnosis not present

## 2021-07-24 DIAGNOSIS — J439 Emphysema, unspecified: Secondary | ICD-10-CM | POA: Diagnosis not present

## 2021-07-24 DIAGNOSIS — E782 Mixed hyperlipidemia: Secondary | ICD-10-CM | POA: Diagnosis not present

## 2021-07-24 DIAGNOSIS — I110 Hypertensive heart disease with heart failure: Secondary | ICD-10-CM | POA: Diagnosis not present

## 2021-07-24 DIAGNOSIS — I38 Endocarditis, valve unspecified: Secondary | ICD-10-CM | POA: Diagnosis not present

## 2021-07-24 DIAGNOSIS — Z7982 Long term (current) use of aspirin: Secondary | ICD-10-CM | POA: Diagnosis not present

## 2021-07-24 LAB — TACROLIMUS LEVEL, TROUGH: TACROLIMUS, TROUGH: 7.9 ng/mL (ref 5.0–15.0)

## 2021-07-24 LAB — BASIC METABOLIC PANEL
ANION GAP: 5 mmol/L (ref 5–14)
BLOOD UREA NITROGEN: 25 mg/dL — ABNORMAL HIGH (ref 9–23)
BUN / CREAT RATIO: 19
CALCIUM: 10.1 mg/dL (ref 8.7–10.4)
CHLORIDE: 104 mmol/L (ref 98–107)
CO2: 25.7 mmol/L (ref 20.0–31.0)
CREATININE: 1.35 mg/dL — ABNORMAL HIGH
EGFR CKD-EPI (2021) FEMALE: 44 mL/min/{1.73_m2} — ABNORMAL LOW (ref >=60)
GLUCOSE RANDOM: 113 mg/dL — ABNORMAL HIGH (ref 70–99)
POTASSIUM: 5.3 mmol/L — ABNORMAL HIGH (ref 3.4–4.8)
SODIUM: 135 mmol/L (ref 135–145)

## 2021-07-24 LAB — URINALYSIS WITH MICROSCOPY
BILIRUBIN UA: NEGATIVE
BLOOD UA: NEGATIVE
GLUCOSE UA: NEGATIVE
KETONES UA: NEGATIVE
LEUKOCYTE ESTERASE UA: NEGATIVE
NITRITE UA: NEGATIVE
PH UA: 7 (ref 5.0–9.0)
PROTEIN UA: NEGATIVE
RBC UA: 2 /HPF (ref <4)
RENAL TUBULAR EPITHELIAL CELLS: 1 /HPF — ABNORMAL HIGH (ref <=0)
SPECIFIC GRAVITY UA: 1.01 (ref 1.005–1.030)
SQUAMOUS EPITHELIAL: 8 /HPF — ABNORMAL HIGH (ref 0–5)
TRANSITIONAL EPITHELIAL: 3 /HPF — ABNORMAL HIGH (ref 0–2)
UROBILINOGEN UA: 0.2
WBC UA: 5 /HPF (ref 0–5)

## 2021-07-24 LAB — LIPID PANEL
CHOLESTEROL/HDL RATIO SCREEN: 2.3 (ref 1.0–4.5)
CHOLESTEROL: 141 mg/dL (ref <=200)
HDL CHOLESTEROL: 61 mg/dL — ABNORMAL HIGH (ref 40–60)
LDL CHOLESTEROL CALCULATED: 52 mg/dL (ref 40–99)
NON-HDL CHOLESTEROL: 80 mg/dL (ref 70–130)
TRIGLYCERIDES: 139 mg/dL (ref 0–150)
VLDL CHOLESTEROL CAL: 27.8 mg/dL (ref 11–41)

## 2021-07-24 LAB — PROTEIN / CREATININE RATIO, URINE
CREATININE, URINE: 56.2 mg/dL
PROTEIN URINE: 11.6 mg/dL
PROTEIN/CREAT RATIO, URINE: 0.206

## 2021-07-24 LAB — CBC W/ AUTO DIFF
BASOPHILS ABSOLUTE COUNT: 0 10*9/L (ref 0.0–0.1)
BASOPHILS RELATIVE PERCENT: 0.3 %
EOSINOPHILS ABSOLUTE COUNT: 0.2 10*9/L (ref 0.0–0.5)
EOSINOPHILS RELATIVE PERCENT: 2.1 %
HEMATOCRIT: 30.1 % — ABNORMAL LOW (ref 34.0–44.0)
HEMOGLOBIN: 10.2 g/dL — ABNORMAL LOW (ref 11.3–14.9)
LYMPHOCYTES ABSOLUTE COUNT: 1.2 10*9/L (ref 1.1–3.6)
LYMPHOCYTES RELATIVE PERCENT: 12.5 %
MEAN CORPUSCULAR HEMOGLOBIN CONC: 34 g/dL (ref 32.0–36.0)
MEAN CORPUSCULAR HEMOGLOBIN: 33 pg — ABNORMAL HIGH (ref 25.9–32.4)
MEAN CORPUSCULAR VOLUME: 97.1 fL — ABNORMAL HIGH (ref 77.6–95.7)
MEAN PLATELET VOLUME: 9 fL (ref 6.8–10.7)
MONOCYTES ABSOLUTE COUNT: 0.5 10*9/L (ref 0.3–0.8)
MONOCYTES RELATIVE PERCENT: 5.4 %
NEUTROPHILS ABSOLUTE COUNT: 7.4 10*9/L (ref 1.8–7.8)
NEUTROPHILS RELATIVE PERCENT: 79.7 %
PLATELET COUNT: 170 10*9/L (ref 150–450)
RED BLOOD CELL COUNT: 3.1 10*12/L — ABNORMAL LOW (ref 3.95–5.13)
RED CELL DISTRIBUTION WIDTH: 17 % — ABNORMAL HIGH (ref 12.2–15.2)
WBC ADJUSTED: 9.3 10*9/L (ref 3.6–11.2)

## 2021-07-24 LAB — MAGNESIUM: MAGNESIUM: 1.8 mg/dL (ref 1.6–2.6)

## 2021-07-24 LAB — IRON PANEL
IRON SATURATION: 26 % (ref 20–55)
IRON: 60 ug/dL
TOTAL IRON BINDING CAPACITY: 230 ug/dL — ABNORMAL LOW (ref 250–425)

## 2021-07-24 LAB — PHOSPHORUS: PHOSPHORUS: 2.4 mg/dL (ref 2.4–5.1)

## 2021-07-24 MED ORDER — MYCOPHENOLATE SODIUM 180 MG TABLET,DELAYED RELEASE
ORAL_TABLET | Freq: Two times a day (BID) | ORAL | 3 refills | 90.00000 days | Status: CP
Start: 2021-07-24 — End: 2022-07-24
  Filled 2021-07-25: qty 60, 30d supply, fill #0

## 2021-07-24 MED FILL — AMLODIPINE 10 MG TABLET: ORAL | 30 days supply | Qty: 30 | Fill #0

## 2021-07-24 NOTE — Unmapped (Signed)
AOBP:  Right        arm          Large    cuff     Average :   183/67             Pulse: 77    1st reading:  187/68          Pulse: 77    2nd reading:  180/68          Pulse:76    3rd reading:  182/64           Pulse: 77    Patient's BP is high. Patient denies any symptoms. Reports that she did not take her medications today. MD made aware

## 2021-07-24 NOTE — Unmapped (Signed)
Saw patient in clinic. Reports she is doing well. PT 2 times a week for 12 weeks    Admitted from 1.7-1.20    Drinking 4 bottles of water and other fluids    +appeteite is better but varies. Drinking ensures but needs to try lactose free    +fatigue but better than it was    +HA since she had COVID that are intermittent, tylenol relieves it    +heart palpitations 2-3 times a month. Last saw cardiology last week.     +SOb with exertion    +chronic right leg numbness    +tremors, same    BS at home 89, 104. Highest 220 after she ate. No lows    Denies n/v/d, dizziness, CP, abdominal pain, urinary problems, swelling, or fevers     Labs today and took envarsus at 9am yesterday

## 2021-07-24 NOTE — Unmapped (Signed)
[facility] Specialty Medication Onboarding    Specialty Medication: Mycophenolate 180mg  EC tablet  Prior Authorization: Not Required   Financial Assistance: No - copay  <$25  Final Copay/Day Supply: $9.85 / 30 days    Insurance Restrictions: None     Notes to Pharmacist: N/A    The triage team has completed the benefits investigation and has determined that the patient is able to fill this medication at [facility]. Please contact the patient to complete the onboarding or follow up with the prescribing physician as needed.

## 2021-07-24 NOTE — Unmapped (Signed)
Transplant Nephrology Clinic Visit    Assessment/Plan:  Michele Cisneros is a 66 y.o. female with DM, HTN, CAD, and COPD and is s/p deceased donor kidney transplant on 03/30/2019 for native kidney ESRD presumed secondary to diabetic nephropathy. She is seen in follow up of her transplant and associated medical problems after recent hospitalization. Issues addressed today include:      S/P deceased donor kidney transplant with no history of rejection or DSAs.   - Serum creatinine is 1.35 mg/dL and within her baseline range of 0.9-1.4 mg/dL despite recent AKI related to CHF and the need for diuressi.    - DSA screens have been negative (most recently 09/12/20)   - UP/C has normalized at 0.206.   - BK viral load undetectable 07/24/21.      Immunosuppression Management   - Tacrolimus level is 7.9 after a recent increase in the dose of Envarsus to 11 mg daily from 9 mg daily (target 4-7)    - Will continue Envarsus XR 11 mg daily.   - Myfortic to be restarted today at 180 mg bid now that GI symptoms have resolved.     History of recurrent post-transplant UTI:   - Most recent UTI 06/25/21 grew klebsiella and E.coli and was treated with ceftriaxone followed by cefdinir for 7 days  - Other UTIs include proteus isolated on 11/28/19, 12/26/19,02/27/20 and 03/19/20 with citrobacter isolated 03/19/20  - In hospital patient was found to have urinary retention requiring temporary placement of a Foley  - Will refer to Urogynecology     COVID-19 infection, confirmed 07/01/21  - Infection was presumed obtained in the hospital and not the cause for hospitalization.  - S/P treatment with remdesivir.  - Had received 5 doses of COVID vaccine including a bivalent booster prior to her illness.  - No complications or long-COVID symptoms.     Coronary artery disease, s/p RCA stent 2015  - Myocardial perfusion study 06/19/21 revealed no evidence of ischemia.  - Denies chest pain since hospital discharge  - Continue statin, aspirin, prn SL NTG    Chronic CHF with diastolic dysfunction and valvular heart disease  - Resulted in recent admission for acute on chronic CHF  - Echocardiogram 06/27/21 reveals mild to moderate MR/MS/AI/AS  - DOE is improved but still present  - Will continue furosemide 40 mg bid, hydralazine, amlodipine  - ACE inhibitor/ARB therapy not initiated due to hyperkalemia and recent AKI.  - She will follow up with her local Cardiologist and was seen since hospital discharge    Hypertension, with postural hypotension and history of syncope   - BP today in clinic 183/67 today   - Home BP has been <130/80  - Postural dizziness has improved    - The balance between symptomatic postural hypotension and recumbent or seated HTN has been challenging, but she is now improved.   - Will continue amlodipine 10 mg daily, clonidine 0.2 mg bid, hydralazine 50 mg tid, furosemide 40 mg bid.      Hyperkalemia with history of metabolic acidosis  - K 5.3 today, serum CO2 25.    - Will hold Lokelma and continue furosemide and sodium bicarbonate    Right breast nodule  - Noted on PET CT myocardial perfusion study 06/19/21  - Mammogram is needed    Anemia, Iron Deficiency  - Hemoglobin has improved with iron supplementation to 10.2 today.    - Iron saturation is now up to 26%  - B12 and folate levels were  normal  - She has a history of ulcers that were healed on EGD 05/04/19 and a history of tubular adenomas   - Will plan colonoscopy     History of CMV viremia  - Viral load negative 07/24/21   - On no therapy currently.    Esophageal stenosis, history of esophageal ulcers:   - S/P dilation 07/06/19 with resolution of ulcers on EGD compared to 05/04/19 EGD  - She remains on Nexium  - She needs repeat EGD.    Pulmonary Nodules with History of Smoking Associated COPD:   - Chest CT 09/12/20 revealed stable pulmonary nodules compared to 05/02/19  - Will continue annual lung cancer screening low dose CT  - Chest CT now due 09/2021    Lumbar Radiculopathy Secondary to L5-S1 disc disease Peripheral Neuropathy secondary to DM   - Following with [facility]  - Continue Lyrica.    Thyroid Nodule:   - [place] 02/01/20 revealed no nodules suggestive of cancer     History of DM, type II, controlled without current therapy:  - A1C 7.6 on 04/26/21  - SGLT-2 inhibitor may be indicated though some concern due to recurrent UTIs.  - She is unlikely to tolerate a GLP-1 receptor agonist due to GI concerns.    - Will continue metformin 500 mg bid    Health Maintenance:   Mammogram: [facility] 12/11/20 revealed probable benign calcifications with plans for follow up in 6 months. Chest CT (09/12/20) with asymmetry of breast tissue. Mammogram is now due especially in view of findings on PET CT myocardial perfusion study.   Pap Smear: s/p hysterectomy  Colonoscopy: repeat study now due based on history of multiple tubular adenomas and anemia, will plan EGD at same time  Lung cancer screening: annual low dose chest CT, last study 09/12/20, due 4/23  Renal US: native kidney imaging now due    Immunizations/Infection Prevention:   - Flu: 04/11/21  - Prevnar 13: not noted in records.    - Pneumovax: 03/23/18  - COVID-19: S/P 5 doses including bivalent booster 05/06/21. She is aware of limited efficacy of the vaccine in immunosuppressed patients.      Follow-Up:   - Return to clinic in 4 months  - EGD and colonoscopy needs to be scheduled  - Mammogram needed  - Cardiology follow up  - Needs screening renal US  - Chest CT 09/2021     Interval History:   She was hospitalized at [facility] 06/15/21-06/28/21 with acute on chronic heart failure, anemia, UTI and COVID-19 infection. Her initial presenting symptoms were progressively worsening dyspnea over a several week period and a 2 day history of chest pain. CXR revealed pulmonary edema and BNP was elevated at 894. Myocardial perfusion study revealed no evidence of ischemia with EF >60%. TEE revealed EF 60-65%, mild to moderate MR/MS/AR/AS. She was treated with IV furosemide and required a Foley catheter placement after developing urinary retention of 1277 ml of urine. Though she was initially COVID-19 negative on presentation COVID-19 was diagnosed on 06/21/21. She was treated with remdesivir. Serum creatinine peaked at 1.7 mg/dL and was 1.61 mg/dL on the day of discharge after furosemide induced diuresis. Urine culture grew klebsiella and E.coli and she was treated with ceftriaxone followed by cefdinir for a 7 day course. A right breast nodule was noted on her PET CT scan. She did not have a mammogram as an inpatient. Hemoglobin was 8.7 on admission and reached a nadir of 7.1 on 06/18/21. Seh was  started on oral iron after iron saturation % was found to be 13%, She was discharged on 06/28/21 without a Foley after a successul trial of void and on furosemide 40 mg bid, lokelma 10 gm daily for 14 days, and an increase in the dose of amlodipine to 10 mg daily. Her Envarsus dose was increased to 11 mg daily based on inpatient levels.     Since discharge she has been doing PT 2 x /week and notes less fatigue  She has been seen by her local cardiologist since discharge with no current plans for intervention. She denies chest pain, cough, fever or chills. She continues to have dyspnea on exertion after long walks but otherwise denies shortness of breath. BP has been 110's/60's and she denies postural dizziness. She is having headaches approximately 1-2 x per month and hand tremors are unchanged. She also has right leg numbness. Blood glucoses have been well controlled with no hypoglycemia. She denies nausea, vomiting, dysphagia, or diarrhea. She denies dysuria, alograft tenderness or urinary retention.      Transplant History:    Native Kidney Disease: Diabetic nephropathy   Pre-transplant PRA: 70%   Organ Received: DDKT, DBD, KDPI: 70%  CMV/EBV Status: CMV D-/R+  EBV D+/R+  Resp culture: + strep pneumo  Date of Transplant: 03/30/2019  Implantation biopsy: mild ATN, otherwise normal.  Post-transplant complication - DGF  Induction: Campath with rapid steroid discontinuation  Maintenance Immunosuppression on discharge: Tacrolimus/Myfotic  Date of Ureteral Stent Removal: 06/13/2019  Current Immunosuppression: Tacrolimus monotherapy    Other Past Medical History  1. Diabetes mellitus, type 2, diagnosed 2012.   2. Diabetic peripheral neuropathy  3. Diabetic retinopathy, s/p laser surgery, s/p retinal detachment  4. Hypertension, diagnosed 2012.   5. Mixed hyperlipidemia.   6. Coronary artery disease, s/p NSTEMI 05/2013, PCI RCA, 20% KNm 80% PDA 1. Cardiac cath 05/02/15 LAD 20% and 30% lesions, Cx 35%, RPDA 70%, patent right coronary stent  7. HFpEF, echocardiogram 06/17/21 with EF 60-65%, mild to moderate MR, MS, aortic regurgitation, and aortic stenosis with mild pulmonary HTN  8. COPD, emphysema secondary to smoking  9. Peripheral artery disease  10. Carotid artery stenosis 1-39% bilateral stenosis 12/2017  10. Sickle Cell Trait  11. S/P TAH/BSO 2000  12. Lumbar radiculopathy secondary to L5-S1 disc bulge   13. History of thyroid nodule  14. Colonic polyps removed 08/10/17, pathology tubular adenomas,  follow up in 3 years  15. Right breast soft tissue mass noted 07/07/17 stable in appearance compared to past imaging   16. Pulmonary nodule noted 07/07/17, 11.23/20, and 09/12/20 similar in size to lesion as far back as 2015.   17. OSA on CPAP    Review of Systems    Otherwise as per HPI, all other systems reviewed and are negative.    Medications  Current Outpatient Medications   Medication Sig Dispense Refill   ??? acetaminophen (TYLENOL) 500 MG tablet Take 1-2 tablets (500-1,000 mg total) by mouth every six (6) hours as needed for pain or fever (> 38C or 100.102F). 30 tablet 0   ??? albuterol (PROVENTIL HFA;VENTOLIN HFA) 90 mcg/actuation inhaler Inhale 2 puffs every six (6) hours as needed for wheezing.     ??? amLODIPine (NORVASC) 10 MG tablet Take 1 tablet (10 mg total) by mouth daily. 30 tablet 1   ??? aspirin (ECOTRIN) 81 MG tablet Take 81 mg by mouth daily.     ??? atorvastatin (LIPITOR) 20 MG tablet Take 1 tablet (20 mg total)  by mouth daily. 90 tablet 3   ??? blood sugar diagnostic (CONTOUR TEST STRIPS) Strp by Other route two (2) times a day. TEST BLOOD SUGARS 2 TIMES DAILY 60 strip 5   ??? blood-glucose meter kit Please dispense for patient to use at home to check blood sugars.     ??? cloNIDine HCL (CATAPRES) 0.1 MG tablet Take 2 tablets (0.2 mg total) by mouth Two (2) times a day. 120 tablet 11   ??? ergocalciferol-1,250 mcg, 50,000 unit, (VITAMIN D2-1,250 MCG, 50,000 UNIT,) 1,250 mcg (50,000 unit) capsule Take 1 capsule (1,250 mcg total) by mouth once a week. Take for 8 weeks as directed. 4 capsule 2   ??? esomeprazole (NEXIUM) 20 MG capsule Take 1 capsule (20 mg total) by mouth daily. 90 capsule 3   ??? ferrous sulfate 325 (65 FE) MG EC tablet Take 1 tablet (325 mg total) by mouth every other day. 15 tablet 11   ??? furosemide (LASIX) 40 MG tablet Take 1 tablet (40 mg total) by mouth Two (2) times a day. 60 tablet 0   ??? hydrALAZINE (APRESOLINE) 50 MG tablet Take 50 mg by mouth Three (3) times a day as needed (high blood pressure). 1-3x/day as needed for BP > 140.     ??? LANCETS,THIN MISC Using at home to test blood sugars twice daily.     ??? lidocaine (LIDODERM) 5 % patch Place 1 patch on the skin daily. Apply to affected area for 12 hours only each day (then remove patch) 30 patch 0   ??? magnesium oxide (MAG-OX) 400 mg (241.3 mg elemental magnesium) tablet Take 1 tablet (400 mg total) by mouth Two (2) times a day. 120 tablet 11   ??? metFORMIN (GLUCOPHAGE) 500 MG tablet Take 1 tablet (500 mg total) by mouth in the morning and 1 tablet (500 mg total) in the evening. Take with meals. 180 tablet 3   ??? nitroglycerin (NITROSTAT) 0.4 MG SL tablet Place 0.4 mg under the tongue daily as needed.     ??? pregabalin (LYRICA) 75 MG capsule Take 1 capsule (75 mg total) by mouth every morning AND 2 capsules (150 mg total) nightly. 90 capsule 3   ??? sodium bicarbonate 650 mg tablet Take 1 tablet (650 mg total) by mouth Two (2) times a day. 60 tablet 11   ??? tacrolimus (ENVARSUS XR) 1 mg Tb24 extended release tablet Take 3 (1mg ) tablet with 2 (4mg ) tablets every morning to equal 11mg  daily dose 90 tablet 3   ??? tacrolimus (ENVARSUS XR) 4 mg Tb24 extended release tablet Take 2 tablets (8 mg total) by mouth daily. Take 2 (4mg ) tablets with 3 (1mg ) tablet every morning to equal 11mg  daily dose 60 tablet 11   ??? docusate sodium (COLACE) 100 MG capsule Take 1 capsule (100 mg total) by mouth two (2) times a day as needed for constipation. (Patient not taking: Reported on 07/24/2021) 60 capsule 1   ??? polyethylene glycol (MIRALAX) 17 gram packet Take 17 g by mouth Two (2) times a day. (Patient not taking: Reported on 07/03/2021) 60 packet 0   ??? sodium zirconium cyclosilicate (LOKELMA) 10 gram PwPk packet Take 1 packet (10 g total) by mouth daily as needed. (Patient not taking: Reported on 07/24/2021) 30 packet 5     No current facility-administered medications for this visit.           Physical Exam  BP 183/67 (BP Site: R Arm, BP Position: Sitting, BP Cuff Size: Large)  -  Pulse 77  - Temp 36.2 ??C (97.1 ??F) (Temporal)  - Ht 171.5 cm (5' 7.52)  - Wt 72.6 kg (160 lb)  - BMI 24.68 kg/m??   General: Patient is a pleasant female in no apparent distress.  Eyes: Sclera anicteric.  Neck: Supple without LAD/JVD/bruits.  Lungs: Clear to auscultation bilaterally, no wheezes/rales/rhonchi.  Cardiovascular: grade 2/6 systolic murmur.  Abdomen: Soft, notender/nondistended. Positive bowel sounds. No hepatosplenomegaly, masses or bruits appreciated.  Extremities: 1+ edema  Skin: Without rash  Neurological: ataxic gait, reduced strength in legs, symmetric.  Psychiatric: Mood and affect appropriate.    Laboratory Data and Imaging Reviewed

## 2021-07-25 DIAGNOSIS — E1122 Type 2 diabetes mellitus with diabetic chronic kidney disease: Principal | ICD-10-CM

## 2021-07-25 DIAGNOSIS — Z794 Long term (current) use of insulin: Principal | ICD-10-CM

## 2021-07-25 DIAGNOSIS — I509 Heart failure, unspecified: Principal | ICD-10-CM

## 2021-07-25 DIAGNOSIS — J309 Allergic rhinitis, unspecified: Secondary | ICD-10-CM | POA: Diagnosis not present

## 2021-07-25 DIAGNOSIS — E785 Hyperlipidemia, unspecified: Secondary | ICD-10-CM | POA: Diagnosis not present

## 2021-07-25 DIAGNOSIS — D631 Anemia in chronic kidney disease: Secondary | ICD-10-CM | POA: Diagnosis not present

## 2021-07-25 DIAGNOSIS — G4733 Obstructive sleep apnea (adult) (pediatric): Secondary | ICD-10-CM | POA: Diagnosis not present

## 2021-07-25 DIAGNOSIS — I6523 Occlusion and stenosis of bilateral carotid arteries: Secondary | ICD-10-CM | POA: Diagnosis not present

## 2021-07-25 DIAGNOSIS — E875 Hyperkalemia: Secondary | ICD-10-CM | POA: Diagnosis not present

## 2021-07-25 DIAGNOSIS — E1151 Type 2 diabetes mellitus with diabetic peripheral angiopathy without gangrene: Secondary | ICD-10-CM | POA: Diagnosis not present

## 2021-07-25 DIAGNOSIS — K219 Gastro-esophageal reflux disease without esophagitis: Secondary | ICD-10-CM | POA: Diagnosis not present

## 2021-07-25 DIAGNOSIS — I272 Pulmonary hypertension, unspecified: Secondary | ICD-10-CM | POA: Diagnosis not present

## 2021-07-25 DIAGNOSIS — I2510 Atherosclerotic heart disease of native coronary artery without angina pectoris: Secondary | ICD-10-CM | POA: Diagnosis not present

## 2021-07-25 DIAGNOSIS — I5033 Acute on chronic diastolic (congestive) heart failure: Secondary | ICD-10-CM | POA: Diagnosis not present

## 2021-07-25 DIAGNOSIS — Z7984 Long term (current) use of oral hypoglycemic drugs: Secondary | ICD-10-CM | POA: Diagnosis not present

## 2021-07-25 DIAGNOSIS — E11319 Type 2 diabetes mellitus with unspecified diabetic retinopathy without macular edema: Secondary | ICD-10-CM | POA: Diagnosis not present

## 2021-07-25 DIAGNOSIS — M549 Dorsalgia, unspecified: Secondary | ICD-10-CM | POA: Diagnosis not present

## 2021-07-25 DIAGNOSIS — I080 Rheumatic disorders of both mitral and aortic valves: Secondary | ICD-10-CM | POA: Diagnosis not present

## 2021-07-25 DIAGNOSIS — E1140 Type 2 diabetes mellitus with diabetic neuropathy, unspecified: Secondary | ICD-10-CM | POA: Diagnosis not present

## 2021-07-25 DIAGNOSIS — J449 Chronic obstructive pulmonary disease, unspecified: Secondary | ICD-10-CM | POA: Diagnosis not present

## 2021-07-25 DIAGNOSIS — Z940 Kidney transplant status: Secondary | ICD-10-CM | POA: Diagnosis not present

## 2021-07-25 DIAGNOSIS — G8929 Other chronic pain: Secondary | ICD-10-CM | POA: Diagnosis not present

## 2021-07-25 DIAGNOSIS — N1832 Chronic kidney disease, stage 3b: Secondary | ICD-10-CM | POA: Diagnosis not present

## 2021-07-25 DIAGNOSIS — Z79621 Long term (current) use of calcineurin inhibitor: Secondary | ICD-10-CM | POA: Diagnosis not present

## 2021-07-25 DIAGNOSIS — I130 Hypertensive heart and chronic kidney disease with heart failure and stage 1 through stage 4 chronic kidney disease, or unspecified chronic kidney disease: Secondary | ICD-10-CM | POA: Diagnosis not present

## 2021-07-25 DIAGNOSIS — D84821 Immunodeficiency due to drugs: Secondary | ICD-10-CM | POA: Diagnosis not present

## 2021-07-25 DIAGNOSIS — U071 COVID-19: Secondary | ICD-10-CM | POA: Diagnosis not present

## 2021-07-25 LAB — CMV DNA, QUANTITATIVE, PCR: CMV VIRAL LD: NOT DETECTED

## 2021-07-25 MED ORDER — FUROSEMIDE 40 MG TABLET
ORAL_TABLET | Freq: Two times a day (BID) | ORAL | 11 refills | 30 days | Status: CP
Start: 2021-07-25 — End: ?
  Filled 2021-08-01: qty 60, 30d supply, fill #0

## 2021-07-25 MED FILL — ESOMEPRAZOLE MAGNESIUM 20 MG CAPSULE,DELAYED RELEASE: ORAL | 90 days supply | Qty: 90 | Fill #1

## 2021-07-25 NOTE — Unmapped (Signed)
This onboarding is for the following medication:  1) Myfortic      [facility]   Patient Onboarding/Medication Counseling    Ms.Michele Cisneros is a 66 y.o. female with a kidney transplant who I am counseling today on continuation of therapy.  I am speaking to the patient.    Was a Nurse, learning disability used for this call? No    Verified patient's date of birth / HIPAA.    Specialty medication(s) to be sent: Transplant:  mycophenolic acid 180mg       Non-specialty medications/supplies to be sent: furosemide, esomeprazole      Medications not needed at this time: none     The patient declined counseling on missed dose instructions, goals of therapy, side effects and monitoring parameters, warnings and precautions, drug/food interactions and storage, handling precautions, and disposal because they have taken the medication previously. The information in the declined sections below are for informational purposes only and was not discussed with patient.       Myfortic (mycophenolic acid)    Medication & Administration     Dosage:   ??? Take 1 tablet (180mg ) two times a day.    Administration:   ??? Take with or without food, although taking with food helps minimize GI side effects.  ??? Swallow the pills whole, do not chew or crush    Adherence/Missed dose instructions:  ??? Take a missed dose as soon as you think about it.  ??? If it is less than 2 hours until your next dose, skip the missed dose and go back to your normal time.  ??? Do not take 2 doses at the same time or extra doses.    Goals of Therapy     ??? To prevent organ rejection    Side Effects & Monitoring Parameters     ??? Common side effects  ??? Back or joint pain  ??? Constipation  ??? Headache/dizziness  ??? Not hungry  ??? Stomach pain, diarrhea, constipation, gas, upset stomach, vomiting, nausea  ??? Feeling tired or weak  ??? Shakiness  ??? Trouble sleeping  ??? Increased risk of infection    ??? The following side effects should be reported to the provider:  ??? Allergic reaction  ??? High blood sugar (confusion, feeling sleepy, more thirst, more hungry, passing urine more often, flushing, fast breathing, or breath that smells like fruit)  ??? Electrolyte issues (mood changes, confusion, muscle pain or weakness, a heartbeat that does not feel normal, seizures, not hungry, or very bad upset stomach or throwing up)  ??? High or low blood pressure (bad headache or dizziness, passing out, or change in eyesight)  ??? Kidney issues (unable to pass urine, change in how much urine is passed, blood in the urine, or a big weight gain)  ??? Skin (oozing, heat, swelling, redness, or pain), UTI and other infections   ??? Chest pain or pressure  ??? Abnormal heartbeat  ??? Unexplained bleeding or bruising  ??? Abnormal burning, numbness, or tingling  ??? Muscle cramps,  ??? Yellowing of skin or eyes    ??? Monitoring parameters  ??? Pregnancy test initially prior to treatment and 8-10 days later then as needed)  ??? CBC weekly for first month then twice monthly for next 2 months, then monthly)  ??? Monitor Renal and liver functions  ??? Signs of organ rejection    Contraindications, Warnings, & Precautions     ??? *This is a REMS drug and an FDA-approved patient medication guide will  be printed with each dispensation  ??? Black Box Warning: Infections   ??? Black Box Warning: Lymphoproliferative disorders - risk of development of lymphoma and skin malignancy is increased  ??? Black Box Warning: Use during pregnancy is associated with increased risks of first trimester pregnancy loss and congenital malformations.   ??? Black Box Warning: Females of reproductive potential should use contraception during treatment and for 6 weeks after therapy is discontinued  ??? Is patient using an effective method of contraception? Not Applicable  ??? If yes, method of contraception: post menapausal  ??? CNS depression  ??? New or reactivated viral infections  ??? Neutropenia  ??? Female patients and/or their female partners should use effective contraception during treatment of the female patient and for at least 3 months after last dose.  ??? Breastfeeding is not recommended during therapy and for 6 weeks after last dose    Drug/Food Interactions     ??? Medication list reviewed in Epic. The patient was instructed to inform the care team before taking any new medications or supplements. No drug interactions identified.   ??? Do not take Echinacea while on this medication  ??? Check with your doctor before getting any vaccinations (live or inactivated)    Storage, Handling Precautions, & Disposal     ??? Store at room temperature  ??? Keep away from children and pets  ??? This drug is considered hazardous and should be handled as little as possible.  Wash hands before and after touching pills. If someone else helps with medication administration, they should wear gloves.      Current Medications (including OTC/herbals), Comorbidities and Allergies     Current Outpatient Medications   Medication Sig Dispense Refill   ??? acetaminophen (TYLENOL) 500 MG tablet Take 1-2 tablets (500-1,000 mg total) by mouth every six (6) hours as needed for pain or fever (> 38C or 100.82F). 30 tablet 0   ??? albuterol (PROVENTIL HFA;VENTOLIN HFA) 90 mcg/actuation inhaler Inhale 2 puffs every six (6) hours as needed for wheezing.     ??? amLODIPine (NORVASC) 10 MG tablet Take 1 tablet (10 mg total) by mouth daily. 30 tablet 1   ??? aspirin (ECOTRIN) 81 MG tablet Take 81 mg by mouth daily.     ??? atorvastatin (LIPITOR) 20 MG tablet Take 1 tablet (20 mg total) by mouth daily. 90 tablet 3   ??? blood sugar diagnostic (CONTOUR TEST STRIPS) Strp by Other route two (2) times a day. TEST BLOOD SUGARS 2 TIMES DAILY 60 strip 5   ??? blood-glucose meter kit Please dispense for patient to use at home to check blood sugars.     ??? cloNIDine HCL (CATAPRES) 0.1 MG tablet Take 2 tablets (0.2 mg total) by mouth Two (2) times a day. 120 tablet 11   ??? docusate sodium (COLACE) 100 MG capsule Take 1 capsule (100 mg total) by mouth two (2) times a day as needed for constipation. (Patient not taking: Reported on 07/24/2021) 60 capsule 1   ??? ergocalciferol-1,250 mcg, 50,000 unit, (VITAMIN D2-1,250 MCG, 50,000 UNIT,) 1,250 mcg (50,000 unit) capsule Take 1 capsule (1,250 mcg total) by mouth once a week. Take for 8 weeks as directed. 4 capsule 2   ??? esomeprazole (NEXIUM) 20 MG capsule Take 1 capsule (20 mg total) by mouth daily. 90 capsule 3   ??? ferrous sulfate 325 (65 FE) MG EC tablet Take 1 tablet (325 mg total) by mouth every other day. 15 tablet 11   ??? furosemide (LASIX)  40 MG tablet Take 1 tablet (40 mg total) by mouth Two (2) times a day. 60 tablet 0   ??? hydrALAZINE (APRESOLINE) 50 MG tablet Take 50 mg by mouth Three (3) times a day as needed (high blood pressure). 1-3x/day as needed for BP > 140.     ??? LANCETS,THIN MISC Using at home to test blood sugars twice daily.     ??? lidocaine (LIDODERM) 5 % patch Place 1 patch on the skin daily. Apply to affected area for 12 hours only each day (then remove patch) 30 patch 0   ??? magnesium oxide (MAG-OX) 400 mg (241.3 mg elemental magnesium) tablet Take 1 tablet (400 mg total) by mouth Two (2) times a day. 120 tablet 11   ??? metFORMIN (GLUCOPHAGE) 500 MG tablet Take 1 tablet (500 mg total) by mouth in the morning and 1 tablet (500 mg total) in the evening. Take with meals. 180 tablet 3   ??? mycophenolate (MYFORTIC) 180 MG EC tablet Take 1 tablet (180 mg total) by mouth Two (2) times a day. 180 tablet 3   ??? nitroglycerin (NITROSTAT) 0.4 MG SL tablet Place 0.4 mg under the tongue daily as needed.     ??? polyethylene glycol (MIRALAX) 17 gram packet Take 17 g by mouth Two (2) times a day. (Patient not taking: Reported on 07/03/2021) 60 packet 0   ??? pregabalin (LYRICA) 75 MG capsule Take 1 capsule (75 mg total) by mouth every morning AND 2 capsules (150 mg total) nightly. 90 capsule 3   ??? sodium bicarbonate 650 mg tablet Take 1 tablet (650 mg total) by mouth Two (2) times a day. 60 tablet 11   ??? sodium zirconium cyclosilicate (LOKELMA) 10 gram PwPk packet Take 1 packet (10 g total) by mouth daily as needed. (Patient not taking: Reported on 07/24/2021) 30 packet 5   ??? tacrolimus (ENVARSUS XR) 1 mg Tb24 extended release tablet Take 3 (1mg ) tablet with 2 (4mg ) tablets every morning to equal 11mg  daily dose 90 tablet 3   ??? tacrolimus (ENVARSUS XR) 4 mg Tb24 extended release tablet Take 2 tablets (8 mg total) by mouth daily. Take 2 (4mg ) tablets with 3 (1mg ) tablet every morning to equal 11mg  daily dose 60 tablet 11     No current facility-administered medications for this visit.       Allergies   Allergen Reactions   ??? Hydrocodone Hives       Patient Active Problem List   Diagnosis   ??? Diabetes mellitus, type II (CMS-HCC)   ??? Essential hypertension   ??? Hyperlipidemia   ??? Status post deceased-donor kidney transplantation   ??? Allergic rhinitis   ??? Bilateral carotid artery stenosis   ??? Anemia in chronic kidney disease (CKD)   ??? Chronic back pain   ??? Acute on chronic diastolic congestive heart failure (CMS-HCC)   ??? COPD (chronic obstructive pulmonary disease) (CMS-HCC)   ??? Coronary artery disease of native artery of native heart with stable angina pectoris (CMS-HCC)   ??? Neuritis or radiculitis due to rupture of lumbar intervertebral disc   ??? OSA on CPAP   ??? Osteoarthritis of right hip   ??? Mitral regurgitation   ??? Diabetic retinopathy (CMS-HCC)   ??? Diabetic neuropathy (CMS-HCC)   ??? PAD (peripheral artery disease) (CMS-HCC)   ??? Pain due to onychomycosis of toenails of both feet   ??? Syncope   ??? Thyroid nodule   ??? Valvular heart disease   ??? Immunocompromised patient (CMS-HCC)   ???  GERD (gastroesophageal reflux disease)   ??? Esophageal ulceration       Reviewed and up to date in Epic.    Appropriateness of Therapy     Acute infections noted within Epic:  No active infections  Patient reported infection: None    Is medication and dose appropriate based on diagnosis and infection status? Yes    Prescription has been clinically reviewed: Yes      Baseline Quality of Life Assessment      How many days over the past month did your kidney transplant  keep you from your normal activities? For example, brushing your teeth or getting up in the morning. 0    Financial Information     Medication Assistance provided: None Required    Anticipated copay of $9.85 reviewed with patient. Verified delivery address.    Delivery Information     Scheduled delivery date: 07/26/21    Expected start date: 07/26/21    Medication will be delivered via UPS to the prescription address in [facility]  This shipment will not require a signature.      Explained the services we provide at [facility] and that each month we would call to set up refills.  Stressed importance of returning phone calls so that we could ensure they receive their medications in time each month.  Informed patient that we should be setting up refills 7-10 days prior to when they will run out of medication.  A pharmacist will reach out to perform a clinical assessment periodically.  Informed patient that a welcome packet, containing information about our pharmacy and other support services, a Notice of Privacy Practices, and a drug information handout will be sent.      The patient or caregiver noted above participated in the development of this care plan and knows that they can request review of or adjustments to the care plan at any time.      Patient or caregiver verbalized understanding of the above information as well as how to contact the pharmacy at [phone] option 4 with any questions/concerns.  The pharmacy is open Monday through Friday 8:30am-4:30pm.  A pharmacist is available 24/7 via pager to answer any clinical questions they may have.    Patient Specific Needs     - Does the patient have any physical, cognitive, or cultural barriers? No    - Does the patient have adequate living arrangements? (i.e. the ability to store and take their medication appropriately) Yes    - Did you identify any home environmental safety or security hazards? No    - Patient prefers to have medications discussed with  Patient     - Is the patient or caregiver able to read and understand education materials at a high school level or above? Yes    - Patient's primary language is  English     - Is the patient high risk? Yes, patient is taking a REMS drug. Medication is dispensed in compliance with REMS program    SOCIAL DETERMINANTS OF HEALTH     At the [facility], we have learned that life circumstances - like trouble affording food, housing, utilities, or transportation can affect the health of many of our patients.   That is why we wanted to ask: are you currently experiencing any life circumstances that are negatively impacting your health and/or quality of life? No    Social Determinants of Health     Food Insecurity: No Food Insecurity   ???  Worried About Programme researcher, broadcasting/film/video in the Last Year: Never true   ??? Ran Out of Food in the Last Year: Never true   Tobacco Use: High Risk   ??? Smoking Tobacco Use: Every Day   ??? Smokeless Tobacco Use: Never   ??? Passive Exposure: Not on file   Transportation Needs: No Transportation Needs   ??? Lack of Transportation (Medical): No   ??? Lack of Transportation (Non-Medical): No   Alcohol Use: Not on file   Housing/Utilities: Low Risk    ??? Within the past 12 months, have you ever stayed: outside, in a car, in a tent, in an overnight shelter, or temporarily in someone else's home (i.e. couch-surfing)?: No   ??? Are you worried about losing your housing?: No   ??? Within the past 12 months, have you been unable to get utilities (heat, electricity) when it was really needed?: No   Substance Use: Not on file   Financial Resource Strain: Low Risk    ??? Difficulty of Paying Living Expenses: Not very hard   Physical Activity: Not on file   Health Literacy: Low Risk    ??? : Never   Stress: Not on file   Intimate Partner Violence: Not on file   Depression: Not on file   Social Connections: Not on file       Would you be willing to receive help with any of the needs that you have identified today? Not applicable       Tera Helper  [facility] Specialty Pharmacist

## 2021-07-26 LAB — BK VIRUS QUANTITATIVE PCR, BLOOD: BK BLOOD RESULT: NOT DETECTED

## 2021-07-29 LAB — VITAMIN D 1,25 DIHYDROXY: VITAMIN D 1,25-DIHYDROXY: 44 pg/mL

## 2021-07-30 DIAGNOSIS — I509 Heart failure, unspecified: Principal | ICD-10-CM

## 2021-07-30 DIAGNOSIS — E1122 Type 2 diabetes mellitus with diabetic chronic kidney disease: Principal | ICD-10-CM

## 2021-07-30 DIAGNOSIS — Z794 Long term (current) use of insulin: Principal | ICD-10-CM

## 2021-07-30 LAB — VITAMIN D 25 HYDROXY: VITAMIN D, TOTAL (25OH): 45.9 ng/mL (ref 20.0–80.0)

## 2021-07-30 NOTE — Unmapped (Signed)
Called patient to follow up about colonoscopy/egd    States she missed apt when she was admitted. She is working on getting rescheduled.    Mammogram scheduled for next week on the 27th, she will call with results.      Will schedule RUS with next visit

## 2021-07-31 DIAGNOSIS — I509 Heart failure, unspecified: Principal | ICD-10-CM

## 2021-07-31 DIAGNOSIS — E1122 Type 2 diabetes mellitus with diabetic chronic kidney disease: Principal | ICD-10-CM

## 2021-07-31 DIAGNOSIS — Z794 Long term (current) use of insulin: Principal | ICD-10-CM

## 2021-07-31 DIAGNOSIS — K219 Gastro-esophageal reflux disease without esophagitis: Secondary | ICD-10-CM | POA: Diagnosis not present

## 2021-07-31 DIAGNOSIS — I130 Hypertensive heart and chronic kidney disease with heart failure and stage 1 through stage 4 chronic kidney disease, or unspecified chronic kidney disease: Secondary | ICD-10-CM | POA: Diagnosis not present

## 2021-07-31 DIAGNOSIS — G8929 Other chronic pain: Secondary | ICD-10-CM | POA: Diagnosis not present

## 2021-07-31 DIAGNOSIS — D631 Anemia in chronic kidney disease: Secondary | ICD-10-CM | POA: Diagnosis not present

## 2021-07-31 DIAGNOSIS — I6523 Occlusion and stenosis of bilateral carotid arteries: Secondary | ICD-10-CM | POA: Diagnosis not present

## 2021-07-31 DIAGNOSIS — I272 Pulmonary hypertension, unspecified: Secondary | ICD-10-CM | POA: Diagnosis not present

## 2021-07-31 DIAGNOSIS — U071 COVID-19: Secondary | ICD-10-CM | POA: Diagnosis not present

## 2021-07-31 DIAGNOSIS — J449 Chronic obstructive pulmonary disease, unspecified: Secondary | ICD-10-CM | POA: Diagnosis not present

## 2021-07-31 DIAGNOSIS — N1832 Chronic kidney disease, stage 3b: Secondary | ICD-10-CM | POA: Diagnosis not present

## 2021-07-31 DIAGNOSIS — E785 Hyperlipidemia, unspecified: Secondary | ICD-10-CM | POA: Diagnosis not present

## 2021-07-31 DIAGNOSIS — E1151 Type 2 diabetes mellitus with diabetic peripheral angiopathy without gangrene: Secondary | ICD-10-CM | POA: Diagnosis not present

## 2021-07-31 DIAGNOSIS — E875 Hyperkalemia: Secondary | ICD-10-CM | POA: Diagnosis not present

## 2021-07-31 DIAGNOSIS — I080 Rheumatic disorders of both mitral and aortic valves: Secondary | ICD-10-CM | POA: Diagnosis not present

## 2021-07-31 DIAGNOSIS — I2510 Atherosclerotic heart disease of native coronary artery without angina pectoris: Secondary | ICD-10-CM | POA: Diagnosis not present

## 2021-07-31 DIAGNOSIS — D84821 Immunodeficiency due to drugs: Secondary | ICD-10-CM | POA: Diagnosis not present

## 2021-07-31 DIAGNOSIS — M549 Dorsalgia, unspecified: Secondary | ICD-10-CM | POA: Diagnosis not present

## 2021-07-31 DIAGNOSIS — G4733 Obstructive sleep apnea (adult) (pediatric): Secondary | ICD-10-CM | POA: Diagnosis not present

## 2021-07-31 DIAGNOSIS — E11319 Type 2 diabetes mellitus with unspecified diabetic retinopathy without macular edema: Secondary | ICD-10-CM | POA: Diagnosis not present

## 2021-07-31 DIAGNOSIS — Z940 Kidney transplant status: Secondary | ICD-10-CM | POA: Diagnosis not present

## 2021-07-31 DIAGNOSIS — Z79621 Long term (current) use of calcineurin inhibitor: Secondary | ICD-10-CM | POA: Diagnosis not present

## 2021-07-31 DIAGNOSIS — Z7984 Long term (current) use of oral hypoglycemic drugs: Secondary | ICD-10-CM | POA: Diagnosis not present

## 2021-07-31 DIAGNOSIS — E1140 Type 2 diabetes mellitus with diabetic neuropathy, unspecified: Secondary | ICD-10-CM | POA: Diagnosis not present

## 2021-07-31 DIAGNOSIS — I5033 Acute on chronic diastolic (congestive) heart failure: Secondary | ICD-10-CM | POA: Diagnosis not present

## 2021-07-31 DIAGNOSIS — J309 Allergic rhinitis, unspecified: Secondary | ICD-10-CM | POA: Diagnosis not present

## 2021-08-01 ENCOUNTER — Encounter: Admit: 2021-08-01 | Payer: MEDICARE

## 2021-08-01 LAB — HLA DS POST TRANSPLANT
ANTI-DONOR DRW #2 MFI: 0 MFI
ANTI-DONOR HLA-A #1 MFI: 0 MFI
ANTI-DONOR HLA-A #2 MFI: 0 MFI
ANTI-DONOR HLA-B #1 MFI: 0 MFI
ANTI-DONOR HLA-B #2 MFI: 0 MFI
ANTI-DONOR HLA-C #2 MFI: 0 MFI
ANTI-DONOR HLA-DQB #1 MFI: 0 MFI
ANTI-DONOR HLA-DQB #2 MFI: 0 MFI
ANTI-DONOR HLA-DR #1 MFI: 0 MFI
ANTI-DONOR HLA-DR #2 MFI: 0 MFI

## 2021-08-01 LAB — FSAB CLASS 1 ANTIBODY SPECIFICITY: HLA CLASS 1 ANTIBODY RESULT: POSITIVE

## 2021-08-01 LAB — FSAB CLASS 2 ANTIBODY SPECIFICITY: HLA CL2 AB RESULT: NEGATIVE

## 2021-08-05 ENCOUNTER — Ambulatory Visit
Admission: RE | Admit: 2021-08-05 | Discharge: 2021-08-05 | Disposition: A | Discharge status: Home / Self Care | Payer: Medicare Other | Source: Ambulatory Visit | Attending: Internal Medicine | Admitting: Internal Medicine

## 2021-08-05 ENCOUNTER — Other Ambulatory Visit: Payer: Self-pay

## 2021-08-05 DIAGNOSIS — R922 Inconclusive mammogram: Secondary | ICD-10-CM | POA: Diagnosis not present

## 2021-08-05 DIAGNOSIS — R921 Mammographic calcification found on diagnostic imaging of breast: Secondary | ICD-10-CM | POA: Diagnosis not present

## 2021-08-15 DIAGNOSIS — Z940 Kidney transplant status: Principal | ICD-10-CM

## 2021-08-15 MED ORDER — TACROLIMUS XR 4 MG TABLET,EXTENDED RELEASE 24 HR
ORAL_TABLET | Freq: Every day | ORAL | 3 refills | 90 days | Status: CP
Start: 2021-08-15 — End: ?

## 2021-08-15 MED ORDER — AMLODIPINE 10 MG TABLET
ORAL_TABLET | Freq: Every day | ORAL | 1 refills | 30 days
Start: 2021-08-15 — End: ?

## 2021-08-15 MED ORDER — TACROLIMUS XR 1 MG TABLET,EXTENDED RELEASE 24 HR
ORAL_TABLET | Freq: Every day | ORAL | 3 refills | 90 days | Status: CP
Start: 2021-08-15 — End: 2022-08-15

## 2021-08-15 MED FILL — ERGOCALCIFEROL (VITAMIN D2) 1,250 MCG (50,000 UNIT) CAPSULE: ORAL | 28 days supply | Qty: 4 | Fill #2

## 2021-08-15 MED FILL — PREGABALIN 75 MG CAPSULE: ORAL | 30 days supply | Qty: 90 | Fill #2

## 2021-08-15 MED FILL — SODIUM BICARBONATE 650 MG TABLET: ORAL | 30 days supply | Qty: 60 | Fill #4

## 2021-08-15 NOTE — Unmapped (Signed)
Called patient to follow up on mammogram. Per patient she was told she has calcium deposits and will repeat mammogram in 1 year

## 2021-08-15 NOTE — Unmapped (Signed)
Provider and PCP not at this clinic

## 2021-08-15 NOTE — Unmapped (Signed)
[facility] Clinical Assessment & Refill Coordination Note    Michele Cisneros, DOB: [birth year]  Phone: [phone] (home)     All above HIPAA information was verified with patient.     Was a Nurse, learning disability used for this call? No    Specialty Medication(s):   Transplant:  mycophenolic acid 180mg      Current Outpatient Medications   Medication Sig Dispense Refill   ??? acetaminophen (TYLENOL) 500 MG tablet Take 1-2 tablets (500-1,000 mg total) by mouth every six (6) hours as needed for pain or fever (> 38C or 100.35F). 30 tablet 0   ??? albuterol (PROVENTIL HFA;VENTOLIN HFA) 90 mcg/actuation inhaler Inhale 2 puffs every six (6) hours as needed for wheezing.     ??? amLODIPine (NORVASC) 10 MG tablet Take 1 tablet (10 mg total) by mouth daily. 30 tablet 1   ??? aspirin (ECOTRIN) 81 MG tablet Take 81 mg by mouth daily.     ??? atorvastatin (LIPITOR) 20 MG tablet Take 1 tablet (20 mg total) by mouth daily. 90 tablet 3   ??? blood sugar diagnostic (CONTOUR TEST STRIPS) Strp by Other route two (2) times a day. TEST BLOOD SUGARS 2 TIMES DAILY 60 strip 5   ??? blood-glucose meter kit Please dispense for patient to use at home to check blood sugars.     ??? cloNIDine HCL (CATAPRES) 0.1 MG tablet Take 2 tablets (0.2 mg total) by mouth Two (2) times a day. 120 tablet 11   ??? docusate sodium (COLACE) 100 MG capsule Take 1 capsule (100 mg total) by mouth two (2) times a day as needed for constipation. (Patient not taking: Reported on 07/24/2021) 60 capsule 1   ??? ergocalciferol-1,250 mcg, 50,000 unit, (VITAMIN D2-1,250 MCG, 50,000 UNIT,) 1,250 mcg (50,000 unit) capsule Take 1 capsule (1,250 mcg total) by mouth once a week. Take for 8 weeks as directed. 4 capsule 2   ??? esomeprazole (NEXIUM) 20 MG capsule Take 1 capsule (20 mg total) by mouth daily. 90 capsule 3   ??? ferrous sulfate 325 (65 FE) MG EC tablet Take 1 tablet (325 mg total) by mouth every other day. 15 tablet 11   ??? furosemide (LASIX) 40 MG tablet Take 1 tablet (40 mg total) by mouth Two (2) times a day. 60 tablet 11   ??? hydrALAZINE (APRESOLINE) 50 MG tablet Take 50 mg by mouth Three (3) times a day as needed (high blood pressure). 1-3x/day as needed for BP > 140.     ??? LANCETS,THIN MISC Using at home to test blood sugars twice daily.     ??? lidocaine (LIDODERM) 5 % patch Place 1 patch on the skin daily. Apply to affected area for 12 hours only each day (then remove patch) 30 patch 0   ??? metFORMIN (GLUCOPHAGE) 500 MG tablet Take 1 tablet (500 mg total) by mouth in the morning and 1 tablet (500 mg total) in the evening. Take with meals. 180 tablet 3   ??? mycophenolate (MYFORTIC) 180 MG EC tablet Take 1 tablet (180 mg total) by mouth Two (2) times a day. 180 tablet 3   ??? nitroglycerin (NITROSTAT) 0.4 MG SL tablet Place 0.4 mg under the tongue daily as needed.     ??? polyethylene glycol (MIRALAX) 17 gram packet Take 17 g by mouth Two (2) times a day. (Patient not taking: Reported on 07/03/2021) 60 packet 0   ??? pregabalin (LYRICA) 75 MG capsule Take 1 capsule (75 mg total) by mouth  every morning AND 2 capsules (150 mg total) nightly. 90 capsule 3   ??? sodium bicarbonate 650 mg tablet Take 1 tablet (650 mg total) by mouth Two (2) times a day. 60 tablet 11   ??? sodium zirconium cyclosilicate (LOKELMA) 10 gram PwPk packet Take 1 packet (10 g total) by mouth daily as needed. (Patient not taking: Reported on 07/24/2021) 30 packet 5   ??? tacrolimus (ENVARSUS XR) 1 mg Tb24 extended release tablet Take 3 (1mg ) tablet with 2 (4mg ) tablets every morning to equal 11mg  daily dose 90 tablet 3   ??? tacrolimus (ENVARSUS XR) 4 mg Tb24 extended release tablet Take 2 tablets (8 mg total) by mouth daily. Take 2 (4mg ) tablets with 3 (1mg ) tablet every morning to equal 11mg  daily dose 60 tablet 11     No current facility-administered medications for this visit.        Changes to medications: Abbi reports no changes at this time.    Allergies   Allergen Reactions   ??? Hydrocodone Hives       Changes to allergies: No    SPECIALTY MEDICATION ADHERENCE     Mycophenolate 180 mg: 10 days of medicine on hand       Medication Adherence    Patient reported X missed doses in the last month: 0  Specialty Medication: Mycophenolate 180mg   Patient is on additional specialty medications: No          Specialty medication(s) dose(s) confirmed: Regimen is correct and unchanged.     Are there any concerns with adherence? No    Adherence counseling provided? Not needed    CLINICAL MANAGEMENT AND INTERVENTION      Clinical Benefit Assessment:    Do you feel the medicine is effective or helping your condition? Yes    Clinical Benefit counseling provided? Not needed    Adverse Effects Assessment:    Are you experiencing any side effects? No    Are you experiencing difficulty administering your medicine? No    Quality of Life Assessment:         How many days over the past month did your kidney transplant  keep you from your normal activities? For example, brushing your teeth or getting up in the morning. 0    Have you discussed this with your provider? Not needed    Acute Infection Status:    Acute infections noted within Epic:  No active infections  Patient reported infection: None    Therapy Appropriateness:    Is therapy appropriate and patient progressing towards therapeutic goals? Yes, therapy is appropriate and should be continued    DISEASE/MEDICATION-SPECIFIC INFORMATION      N/A    PATIENT SPECIFIC NEEDS     - Does the patient have any physical, cognitive, or cultural barriers? No    - Is the patient high risk? Yes, patient is taking a REMS drug. Medication is dispensed in compliance with REMS program    - Does the patient require a Care Management Plan? No     SOCIAL DETERMINANTS OF HEALTH     At the [facility], we have learned that life circumstances - like trouble affording food, housing, utilities, or transportation can affect the health of many of our patients.   That is why we wanted to ask: are you currently experiencing any life circumstances that are negatively impacting your health and/or quality of life? No    Social Determinants of Health     Food Insecurity: No  Food Insecurity   ??? Worried About Programme researcher, broadcasting/film/video in the Last Year: Never true   ??? Ran Out of Food in the Last Year: Never true   Tobacco Use: High Risk   ??? Smoking Tobacco Use: Every Day   ??? Smokeless Tobacco Use: Never   ??? Passive Exposure: Not on file   Transportation Needs: No Transportation Needs   ??? Lack of Transportation (Medical): No   ??? Lack of Transportation (Non-Medical): No   Alcohol Use: Not on file   Housing/Utilities: Low Risk    ??? Within the past 12 months, have you ever stayed: outside, in a car, in a tent, in an overnight shelter, or temporarily in someone else's home (i.e. couch-surfing)?: No   ??? Are you worried about losing your housing?: No   ??? Within the past 12 months, have you been unable to get utilities (heat, electricity) when it was really needed?: No   Substance Use: Not on file   Financial Resource Strain: Low Risk    ??? Difficulty of Paying Living Expenses: Not very hard   Physical Activity: Not on file   Health Literacy: Low Risk    ??? : Never   Stress: Not on file   Intimate Partner Violence: Not on file   Depression: Not on file   Social Connections: Not on file       Would you be willing to receive help with any of the needs that you have identified today? Not applicable       SHIPPING     Specialty Medication(s) to be Shipped:   Transplant:  mycophenolic acid 180mg     Other medication(s) to be shipped: Amlodipine     Changes to insurance: No    Delivery Scheduled: Yes, Expected medication delivery date: 08/20/21.     Medication will be delivered via UPS to the confirmed prescription address in [facility].    The patient will receive a drug information handout for each medication shipped and additional FDA Medication Guides as required.  Verified that patient has previously received a Conservation officer, historic buildings and a Surveyor, mining.    The patient or caregiver noted above participated in the development of this care plan and knows that they can request review of or adjustments to the care plan at any time.      All of the patient's questions and concerns have been addressed.    Tera Helper   [facility] Specialty Pharmacist

## 2021-08-19 ENCOUNTER — Encounter: Payer: Self-pay | Admitting: Podiatry

## 2021-08-19 ENCOUNTER — Ambulatory Visit (INDEPENDENT_AMBULATORY_CARE_PROVIDER_SITE_OTHER): Payer: Medicare Other | Admitting: Podiatry

## 2021-08-19 ENCOUNTER — Other Ambulatory Visit: Payer: Self-pay

## 2021-08-19 DIAGNOSIS — M79675 Pain in left toe(s): Secondary | ICD-10-CM

## 2021-08-19 DIAGNOSIS — B351 Tinea unguium: Secondary | ICD-10-CM

## 2021-08-19 DIAGNOSIS — E1142 Type 2 diabetes mellitus with diabetic polyneuropathy: Secondary | ICD-10-CM

## 2021-08-19 DIAGNOSIS — I739 Peripheral vascular disease, unspecified: Secondary | ICD-10-CM

## 2021-08-19 DIAGNOSIS — L608 Other nail disorders: Secondary | ICD-10-CM

## 2021-08-19 DIAGNOSIS — M79674 Pain in right toe(s): Secondary | ICD-10-CM | POA: Diagnosis not present

## 2021-08-19 MED FILL — MYCOPHENOLATE SODIUM 180 MG TABLET,DELAYED RELEASE: ORAL | 30 days supply | Qty: 60 | Fill #1

## 2021-08-19 NOTE — Progress Notes (Signed)
This patient returns to my office for at risk foot care.  This patient requires this care by a professional since this patient will be at risk due to having kidney transplant, PAD, and diabetes.  This patient is unable to cut nails herself since the patient cannot reach her nails.These nails are painful walking and wearing shoes.  This patient presents for at risk foot care today. ? ?General Appearance  Alert, conversant and in no acute stress. ? ?Vascular  Dorsalis pedis and posterior tibial  pulses are palpable  right foot.  Pulses are absent left foot.  Capillary return is within normal limits  bilaterally. Temperature is within normal limits  bilaterally. ? ?Neurologic  Senn-Weinstein monofilament wire test within normal limits  bilaterally. Muscle power within normal limits bilaterally. ? ?Nails Thick disfigured discolored nails with subungual debris  from hallux to fifth toes bilaterally. No evidence of bacterial infection or drainage bilaterally. ? ?Orthopedic  No limitations of motion  feet .  No crepitus or effusions noted.  No bony pathology or digital deformities noted.  HAV  B/L. ? ?Skin  normotropic skin with no porokeratosis noted bilaterally.  No signs of infections or ulcers noted.    ? ?Onychomycosis  Pain in right toes  Pain in left toes ? ?Consent was obtained for treatment procedures.   Mechanical debridement of nails 1-5  bilaterally performed with a nail nipper.  Filed with dremel without incident.  ? ? ?Return office visit   3 months.                  Told patient to return for periodic foot care and evaluation due to potential at risk complications. ? ? ?Gardiner Barefoot DPM  ?

## 2021-08-20 ENCOUNTER — Telehealth: Payer: Self-pay | Admitting: Gastroenterology

## 2021-08-20 NOTE — Telephone Encounter (Signed)
Called patient back and she wanted to schedule an appointment with Dr. Vicente Males in reference to her constipation. Appointment was scheduled to be done on 08/29/2021. ?

## 2021-08-20 NOTE — Telephone Encounter (Signed)
Patient left voicemail to get an appt with Dr Vicente Males to get a colonoscopy and endoscopy. Requesting call back. ?

## 2021-08-29 ENCOUNTER — Encounter: Payer: Self-pay | Admitting: Gastroenterology

## 2021-08-29 ENCOUNTER — Ambulatory Visit (INDEPENDENT_AMBULATORY_CARE_PROVIDER_SITE_OTHER): Payer: Medicare Other | Admitting: Gastroenterology

## 2021-08-29 ENCOUNTER — Other Ambulatory Visit: Payer: Self-pay

## 2021-08-29 VITALS — BP 141/70 | HR 91 | Temp 98.1°F | Wt 152.6 lb

## 2021-08-29 DIAGNOSIS — R1310 Dysphagia, unspecified: Secondary | ICD-10-CM

## 2021-08-29 DIAGNOSIS — Z86010 Personal history of colonic polyps: Secondary | ICD-10-CM

## 2021-08-29 MED ORDER — NA SULFATE-K SULFATE-MG SULF 17.5-3.13-1.6 GM/177ML PO SOLN: 354.0000 mL | Freq: Once | ORAL | 0 refills | Status: AC

## 2021-08-29 NOTE — Progress Notes (Signed)
?  ?[name] MD, MRCP(U.K) ?[address]  ?[address]  ?[place], [place] [address]  ?Main: [phone]  ?Fax: [phone] ? ? ?Primary Care Physician: McLean-Scocuzza, Nino Glow, MD ? ?Primary Gastroenterologist:  Dr. [name]  ? ?Chief Complaint  ?Patient presents with  ? Constipation  ? ? ?HPI: Theresa [name] is a 66 y.o. female ? ? ?Summary of history : ? ?She has previously been seen by myself back in 2019 for nausea and vomiting.  She had been on home oxygen for COPD, end-stage renal disease, CHF.  In 2019 I did her colonoscopy and polyps were excised which were tubular adenomas and repeat recommended in 3 years.  History of AVMs in the stomach in 2012.  She presented to Dr. Verlin Grills office in December 2022 for constipation not had a bowel movement for a month.  Smoked marijuana.  No fiber in her diet.  She was given a bowel prep to clean out and commenced on Linzess 145 mcg.  She was advised to schedule colonoscopy for surveillance in March 2023. ? ?Interval history   05/22/2021-08/29/2021 ? ?He states he is here today to discuss about setting him up for an upper endoscopy because she has had occasional issues with food getting stuck while swallowing since December.  Not very often very occasionally but she wants it checked up she is also due for a colonoscopy for surveillance of colon polyps that were present previously.  No other complaints.  The Linzess 145 mcg for constipation is causing diarrhea and would like something slightly milder ? ?Current Outpatient Medications  ?Medication Sig Dispense Refill  ? acetaminophen (TYLENOL) 500 MG tablet Take 1,000 mg by mouth daily as needed.    ? acetaminophen (TYLENOL) 500 MG tablet Take by mouth.    ? albuterol (PROVENTIL) (2.5 MG/3ML) 0.083% nebulizer solution Take 3 mLs (2.5 mg total) by nebulization every 6 (six) hours as needed for wheezing or shortness of breath. 75 mL 11  ? albuterol (VENTOLIN HFA) 108 (90 Base) MCG/ACT inhaler Inhale 1-2 puffs into the  lungs every 6 (six) hours as needed for wheezing or shortness of breath. 1 each 12  ? amLODipine (NORVASC) 10 MG tablet Take 1 tablet (10 mg total) by mouth daily. 90 tablet 3  ? aspirin EC 81 MG tablet Take 81 mg by mouth daily. Swallow whole.    ? atorvastatin (LIPITOR) 20 MG tablet Take 1 tablet (20 mg total) by mouth daily. 90 tablet 3  ? cloNIDine (CATAPRES) 0.1 MG tablet Take 2 tablets (0.2 mg total) by mouth 2 (two) times daily. May take an extra 0.1 mg clonidine for blood pressure >160 400 tablet 3  ? ENVARSUS XR 1 MG TB24 Take 1 mg by mouth daily as needed. Total dose is 9 mg every morning at 0900.    ? esomeprazole (NEXIUM) 20 MG capsule Take 20 mg by mouth daily.    ? ferrous sulfate 325 (65 FE) MG EC tablet Take 1 tablet (325 mg total) by mouth 3 (three) times daily with meals. 90 tablet 3  ? furosemide (LASIX) 40 MG tablet Take 1 tablet (40 mg) by mouth once daily, you may take 1 extra tablet (40 mg) once daily at lunch as needed for weight gain/ swelling 30 tablet 3  ? lidocaine (LIDODERM) 5 % Place onto the skin.    ? loratadine (CLARITIN) 10 MG tablet Take 10 mg by mouth daily.    ? magnesium oxide (MAG-OX) 400 MG tablet Take 400  mg by mouth 2 (two) times daily.    ? metFORMIN (GLUCOPHAGE) 500 MG tablet Take by mouth.    ? mycophenolate (MYFORTIC) 180 MG EC tablet Take by mouth.    ? nitroGLYCERIN (NITROSTAT) 0.4 MG SL tablet Place 1 tablet (0.4 mg total) under the tongue every 5 (five) minutes as needed for chest pain. 25 tablet 3  ? nitroGLYCERIN (NITROSTAT) 0.4 MG SL tablet Place under the tongue.    ? pregabalin (LYRICA) 75 MG capsule Take 1 capsule (75 mg total) by mouth 2 (two) times daily. 60 capsule 5  ? sodium bicarbonate 650 MG tablet Take 650 mg by mouth 2 (two) times daily.    ? sodium chloride (OCEAN) 0.65 % SOLN nasal spray Place 2 sprays into both nostrils as needed for congestion. 30 mL 2  ? Sodium Zirconium Cyclosilicate (LOKELMA PO) Take by mouth in the morning and at bedtime.    ?  tacrolimus ER (ENVARSUS XR) 1 MG TB24 Take 3 mg by mouth daily.    ? tacrolimus ER (ENVARSUS XR) 4 MG TB24 Take 8 mg by mouth daily before breakfast. Total dose is 9 mg daily in the morning.    ? tacrolimus ER (ENVARSUS XR) 4 MG TB24 Take 8 mg by mouth daily.    ? Vitamin D, Ergocalciferol, (DRISDOL) 1.25 MG (50000 UNIT) CAPS capsule Take 50,000 Units by mouth every 7 (seven) days. Mondays    ? ?No current facility-administered medications for this visit.  ? ? ?Allergies as of 08/29/2021 - Review Complete 08/29/2021  ?Allergen Reaction Noted  ? Hydrocodone Hives 08/20/2012  ? Lisinopril  03/11/2017  ? ? ?ROS: ? ?General: Negative for anorexia, weight loss, fever, chills, fatigue, weakness. ?ENT: Negative for hoarseness, difficulty swallowing , nasal congestion. ?CV: Negative for chest pain, angina, palpitations, dyspnea on exertion, peripheral edema.  ?Respiratory: Negative for dyspnea at rest, dyspnea on exertion, cough, sputum, wheezing.  ?GI: See history of present illness. ?GU:  Negative for dysuria, hematuria, urinary incontinence, urinary frequency, nocturnal urination.  ?Endo: Negative for unusual weight change.  ?  ?Physical Examination: ? ? BP (!) 141/70   Pulse 91   Temp 98.1 ?F (36.7 ?C) (Oral)   Wt 152 lb 9.6 oz (69.2 kg)   BMI 22.93 kg/m?  ? ?General: Well-nourished, well-developed in no acute distress.  ?Eyes: No icterus. Conjunctivae pink. ?Mouth: Oropharyngeal mucosa moist and pink , no lesions erythema or exudate. ?Lungs: Clear to auscultation bilaterally. Non-labored. ?Neuro: Alert and oriented x 3.  Grossly intact. ?Skin: Warm and dry, no jaundice.   ?Psych: Alert and cooperative, normal mood and affect. ? ? ?Imaging Studies: ?MM DIAG BREAST TOMO BILATERAL ? ?Result Date: 08/05/2021 ?CLINICAL DATA:  Follow-up probably benign left breast calcifications. EXAM: DIGITAL DIAGNOSTIC BILATERAL MAMMOGRAM WITH TOMOSYNTHESIS AND CAD TECHNIQUE: Bilateral digital diagnostic mammography and breast  tomosynthesis was performed. The images were evaluated with computer-aided detection. COMPARISON:  Previous exam(s). ACR Breast Density Category b: There are scattered areas of fibroglandular density. FINDINGS: The previously described probably benign left breast calcifications have not changed significantly since 03/15/2020. No interval findings suspicious for malignancy in either breast. IMPRESSION: 1. Stable left breast probably benign calcifications. 2. No evidence of malignancy elsewhere in either breast. RECOMMENDATION: Bilateral diagnostic mammogram with magnification views on the left in 1 year. That will complete 2 years of follow-up of the left breast probably benign calcifications. I have discussed the findings and recommendations with the patient. If applicable, a reminder letter will be sent to  the patient regarding the next appointment. BI-RADS CATEGORY  3: Probably benign. Electronically Signed   By: Claudie Revering M.D.   On: 08/05/2021 16:37  ? ?Assessment and Plan:  ? ?Theresa [name] is a 66 y.o. y/o female here today to see me for occasional dysphagia ongoing since December.  Initially was more significant but has since got significantly better on its own.  She is due for a surveillance colonoscopy due to personal history of colon polyps.  She also has constipation which is responded to Linzess but causing diarrhea at times. ? ?Plan ?1.  Colonoscopy and EGD to be performed the same time ?2.  High-fiber diet ?3.  Linzess 145 mcg samples have been provided if it works adequately she will call our office for prescription. ? ?I have discussed alternative options, risks & benefits,  which include, but are not limited to, bleeding, infection, perforation,respiratory complication & drug reaction.  The patient agrees with this plan & written consent will be obtained.   ? ? ?Dr [name]  MD,MRCP [facility]) ?Follow up in as needed  ?

## 2021-09-02 MED ORDER — AMLODIPINE 10 MG TABLET
ORAL_TABLET | Freq: Every day | ORAL | 11 refills | 30 days | Status: CP
Start: 2021-09-02 — End: ?
  Filled 2021-09-03: qty 30, 30d supply, fill #0

## 2021-09-02 MED ORDER — MAGNESIUM OXIDE 400 MG (241.3 MG MAGNESIUM) TABLET
ORAL_TABLET | Freq: Two times a day (BID) | ORAL | 11 refills | 60 days
Start: 2021-09-02 — End: 2022-09-02

## 2021-09-03 MED FILL — FUROSEMIDE 40 MG TABLET: ORAL | 30 days supply | Qty: 60 | Fill #1

## 2021-09-03 MED FILL — LOKELMA 10 GRAM ORAL POWDER PACKET: ORAL | 30 days supply | Qty: 30 | Fill #1

## 2021-09-03 MED FILL — METFORMIN 500 MG TABLET: ORAL | 30 days supply | Qty: 60 | Fill #1

## 2021-09-06 ENCOUNTER — Ambulatory Visit: Payer: Medicare Other | Admitting: Anesthesiology

## 2021-09-06 ENCOUNTER — Encounter: Payer: Self-pay | Admitting: Gastroenterology

## 2021-09-06 ENCOUNTER — Ambulatory Visit
Admission: RE | Admit: 2021-09-06 | Discharge: 2021-09-06 | Disposition: A | Discharge status: Home / Self Care | Payer: Medicare Other | Attending: Gastroenterology | Admitting: Gastroenterology

## 2021-09-06 ENCOUNTER — Other Ambulatory Visit: Payer: Self-pay

## 2021-09-06 ENCOUNTER — Encounter
Admission: RE | Disposition: A | Discharge status: Home / Self Care | Payer: Self-pay | Source: Home / Self Care | Attending: Gastroenterology

## 2021-09-06 DIAGNOSIS — Z86010 Personal history of colonic polyps: Secondary | ICD-10-CM | POA: Diagnosis not present

## 2021-09-06 DIAGNOSIS — E785 Hyperlipidemia, unspecified: Secondary | ICD-10-CM | POA: Diagnosis not present

## 2021-09-06 DIAGNOSIS — I132 Hypertensive heart and chronic kidney disease with heart failure and with stage 5 chronic kidney disease, or end stage renal disease: Secondary | ICD-10-CM | POA: Diagnosis not present

## 2021-09-06 DIAGNOSIS — J449 Chronic obstructive pulmonary disease, unspecified: Secondary | ICD-10-CM | POA: Insufficient documentation

## 2021-09-06 DIAGNOSIS — K648 Other hemorrhoids: Secondary | ICD-10-CM | POA: Diagnosis not present

## 2021-09-06 DIAGNOSIS — I252 Old myocardial infarction: Secondary | ICD-10-CM | POA: Diagnosis not present

## 2021-09-06 DIAGNOSIS — K5730 Diverticulosis of large intestine without perforation or abscess without bleeding: Secondary | ICD-10-CM | POA: Insufficient documentation

## 2021-09-06 DIAGNOSIS — Z1211 Encounter for screening for malignant neoplasm of colon: Secondary | ICD-10-CM | POA: Insufficient documentation

## 2021-09-06 DIAGNOSIS — I25119 Atherosclerotic heart disease of native coronary artery with unspecified angina pectoris: Secondary | ICD-10-CM | POA: Insufficient documentation

## 2021-09-06 DIAGNOSIS — I5032 Chronic diastolic (congestive) heart failure: Secondary | ICD-10-CM | POA: Diagnosis not present

## 2021-09-06 DIAGNOSIS — E1122 Type 2 diabetes mellitus with diabetic chronic kidney disease: Secondary | ICD-10-CM | POA: Diagnosis not present

## 2021-09-06 DIAGNOSIS — Z992 Dependence on renal dialysis: Secondary | ICD-10-CM | POA: Diagnosis not present

## 2021-09-06 DIAGNOSIS — Z7984 Long term (current) use of oral hypoglycemic drugs: Secondary | ICD-10-CM | POA: Insufficient documentation

## 2021-09-06 DIAGNOSIS — K640 First degree hemorrhoids: Secondary | ICD-10-CM | POA: Diagnosis not present

## 2021-09-06 DIAGNOSIS — R1310 Dysphagia, unspecified: Secondary | ICD-10-CM | POA: Insufficient documentation

## 2021-09-06 DIAGNOSIS — K635 Polyp of colon: Secondary | ICD-10-CM

## 2021-09-06 DIAGNOSIS — D122 Benign neoplasm of ascending colon: Secondary | ICD-10-CM | POA: Diagnosis not present

## 2021-09-06 DIAGNOSIS — D120 Benign neoplasm of cecum: Secondary | ICD-10-CM | POA: Insufficient documentation

## 2021-09-06 DIAGNOSIS — Z87891 Personal history of nicotine dependence: Secondary | ICD-10-CM | POA: Insufficient documentation

## 2021-09-06 DIAGNOSIS — Z955 Presence of coronary angioplasty implant and graft: Secondary | ICD-10-CM | POA: Diagnosis not present

## 2021-09-06 DIAGNOSIS — E1151 Type 2 diabetes mellitus with diabetic peripheral angiopathy without gangrene: Secondary | ICD-10-CM | POA: Insufficient documentation

## 2021-09-06 DIAGNOSIS — N186 End stage renal disease: Secondary | ICD-10-CM | POA: Insufficient documentation

## 2021-09-06 HISTORY — PX: ESOPHAGOGASTRODUODENOSCOPY: SHX5428

## 2021-09-06 HISTORY — PX: COLONOSCOPY WITH PROPOFOL: SHX5780

## 2021-09-06 LAB — GLUCOSE, CAPILLARY: Glucose-Capillary: 135 mg/dL — ABNORMAL HIGH (ref 70–99)

## 2021-09-06 SURGERY — COLONOSCOPY WITH PROPOFOL
Anesthesia: General

## 2021-09-06 MED ORDER — PROPOFOL 500 MG/50ML IV EMUL
INTRAVENOUS | Status: DC | PRN
  Administered 2021-09-06: 200 ug/kg/min via INTRAVENOUS

## 2021-09-06 MED ORDER — PROPOFOL 10 MG/ML IV BOLUS
INTRAVENOUS | Status: DC | PRN
  Administered 2021-09-06: 50 mg via INTRAVENOUS

## 2021-09-06 MED ORDER — SODIUM CHLORIDE 0.9 % IV SOLN: INTRAVENOUS | Status: DC

## 2021-09-06 NOTE — Anesthesia Postprocedure Evaluation (Signed)
Anesthesia Post Note ? ?Patient: Theresa [name] ? ?Procedure(s) Performed: COLONOSCOPY WITH PROPOFOL ?ESOPHAGOGASTRODUODENOSCOPY (EGD) ? ?Patient location during evaluation: Endoscopy ?Anesthesia Type: General ?Level of consciousness: awake and alert ?Pain management: pain level controlled ?Vital Signs Assessment: post-procedure vital signs reviewed and stable ?Respiratory status: spontaneous breathing, nonlabored ventilation and respiratory function stable ?Cardiovascular status: blood pressure returned to baseline and stable ?Postop Assessment: no apparent nausea or vomiting ?Anesthetic complications: no ? ? ?No notable events documented. ? ? ?Last Vitals:  ?Vitals:  ? 09/06/21 1410 09/06/21 1420  ?BP: (!) 156/45 (!) 197/55  ?Pulse: 71 75  ?Resp: 11 18  ?Temp:    ?SpO2: 100% 100%  ?  ?Last Pain:  ?Vitals:  ? 09/06/21 1420  ?TempSrc:   ?PainSc: 0-No pain  ? ? ?  ?  ?  ?  ?  ?  ? ?[place] Ouch ? ? ? ? ?

## 2021-09-06 NOTE — Anesthesia Preprocedure Evaluation (Signed)
Anesthesia Evaluation  ?Patient identified by MRN, date of birth, ID band ?Patient awake ? ? ? ?Reviewed: ?Allergy & Precautions, NPO status , Patient's Chart, lab work & pertinent test results, reviewed documented beta blocker date and time  ? ?Airway ?Mallampati: II ? ?TM Distance: >3 FB ? ? ? ? Dental ? ?(+) Chipped, Missing, Poor Dentition ?  ?Pulmonary ?COPD,  COPD inhaler, former smoker,  ?  ?Pulmonary exam normal ? ? ? ? ? ? ? Cardiovascular ?METS: 3 - Mets hypertension, Pt. on medications and Pt. on home beta blockers ?+ angina + CAD, + Past MI (2014), + Cardiac Stents, + Peripheral Vascular Disease and +CHF  ?Normal cardiovascular exam+ Valvular Problems/Murmurs MR  ? ? ?  ?Neuro/Psych ? Neuromuscular disease negative psych ROS  ? GI/Hepatic ?negative GI ROS, Neg liver ROS,   ?Endo/Other  ?diabetes, Well Controlled, Type 2, Oral Hypoglycemic Agents ? Renal/GU ?Renal diseaseS/p kidney tx in 2020  ? ?  ?Musculoskeletal ? ?(+) Arthritis ,  ? Abdominal ?Normal abdominal exam  (+)   ?Peds ? Hematology ? ?(+) Blood dyscrasia, anemia ,   ?Anesthesia Other Findings ?Low sats 95%. EF 55-60. ? Reproductive/Obstetrics ? ?  ? ? ? ? ? ? ? ? ? ? ? ? ? ?  ?  ? ? ? ? ? ? ? ? ?Anesthesia Physical ? ?Anesthesia Plan ? ?ASA: III ? ?Anesthesia Plan: General  ? ?Post-op Pain Management:   ? ?Induction: Intravenous ? ?PONV Risk Score and Plan:  ? ?Airway Management Planned: Natural Airway and Simple Face Mask ? ?Additional Equipment:  ? ?Intra-op Plan:  ? ?Post-operative Plan: Extubation in OR ? ?Informed Consent: I have reviewed the patients History and Physical, chart, labs and discussed the procedure including the risks, benefits and alternatives for the proposed anesthesia with the patient or authorized representative who has indicated his/her understanding and acceptance.  ? ? ? ? ? ?Plan Discussed with: CRNA ? ?Anesthesia Plan Comments:   ? ? ? ? ? ? ?Anesthesia Quick Evaluation ? ?

## 2021-09-06 NOTE — Anesthesia Procedure Notes (Signed)
Date/Time: 09/06/2021 1:40 PM ?Performed by: Nelda Marseille, CRNA ?Pre-anesthesia Checklist: Patient identified, Emergency Drugs available, Suction available, Patient being monitored and Timeout performed ?Oxygen Delivery Method: Nasal cannula ? ? ? ? ?

## 2021-09-06 NOTE — H&P (Signed)
? ? ? ?[name], MD ?[address], [place], [place], [place], [address] ?[address] [place], [place], [place], [address] ?Phone: [phone]  ?Fax: [phone] ? ?Primary Care Physician:  McLean-Scocuzza, Nino Glow, MD ? ? ?Pre-Procedure History & Physical: ?HPI:  Theresa [name] is a 66 y.o. female is here for an endoscopy and colonoscopy  ?  ?Past Medical History:  ?Diagnosis Date  ? Anemia of chronic disease   ? Anginal pain ([place])   ? Carotid arterial disease ([place])   ? a. 02/2013 U/S: 40-59% bilat ICA stenosis.  ? Chronic constipation   ? Chronic diastolic CHF (congestive heart failure) ([place])   ? a. 10/2013 Echo [facility]): EF 55-60%, mod conc LVH, mod MR, mildly dil LA, mild Ao sclerosis w/o stenosis; b. 02/2016 Echo: EF 55-60%, no rwma, Gr DD, mild AS, mod to sev MR, mildly dil LA, PASP 51mHg; c. 07/2017 Echo ([facility]: EF >55%, mild to mod LVH, Gr2 DD, Ao scl, Mod MR, mod dil LA, nl RV fxn.  ? Colon polyps   ? COPD (chronic obstructive pulmonary disease) ([place]   ? Coronary artery disease   ? a. 05/2013 NSTEMI/PCI: LM 20d, LAD min irregs, LCX small, nl, OM1 nl, RCA dom 91m2.5x16 Promus DES), PDA1 80p; b. 04/2015 Cath: LM nl, LAD 3065m1/2 min irregs, LCX 35p/m, OM2/3 min irregs, RCA patent mid stent, RPDA 70ost, RPLB1 30, RPLB2/3 min irregs, EF 55-65%--> Med Rx; c. [facility] EF 64%.  ? COVID-19   ? 06/2021  ? Diabetes mellitus   ? Diabetic neuropathy ([place] ? Diabetic retinopathy ([facility]2/20/2014  ? Hx bilat retinal detachment, proliferative diab retinopathy and bilat vitreous hemorrhage   ? Emphysema   ? ESRD on hemodialysis ([place] ? [place]. [facility] [place] [id number]/Dr. [name], on a MWF schedule.  She started dialysis in Feb 2014.  Etiology of renal failure not known, likely diabetes.  Has a left upper arm AV graft.  ? History of bronchitis   ? Mar 2012  ? History of pneumonia   ? June 2012  ? History of tobacco abuse   ? a. Quit 2012.  ? Hyperlipidemia   ? Hypertension   ?  Moderate to severe mitral insufficiency   ? a. 10/2013 Echo: EF 55-60%, mod MR; b. 02/2016 Echo: EF 55-60%, mod to sev MR directed posteriorly--felt to be dynamic -worse with volume overload; c. [facility] Mod [facility].  ? [facility]  ? Peripheral vascular disease ([place] ? Sickle cell trait ([facility] ? Thyroid nodule   ? [place] due to f/u [place] 1 year   ? Valvular heart disease   ? echo 06/17/21 mild to moderate  ? ? ?Past Surgical History:  ?Procedure Laterality Date  ? A/V FISTULAGRAM Left 11/10/2017  ? Procedure: A/V FISTULAGRAM;  Surgeon: SchKatha CabalD;  [facility];  Service: Cardiovascular;  Laterality: Left;  ? A/V SHUNTOGRAM Left 02/08/2019  ? Procedure: A/V SHUNTOGRAM;  Surgeon: SchKatha CabalD;  [facility];  Service: Cardiovascular;  Laterality: Left;  ? ABDOMINAL HYSTERECTOMY    ? 2000 for cysts on ovaries total no abnormal pap  ? CARDIAC CATHETERIZATION    ? CARDIAC CATHETERIZATION N/A 05/02/2015  ? Procedure: Left Heart Cath and Coronary Angiography;  Surgeon: MuhWellington HampshireD;  [facility];  Service: Cardiovascular;  Laterality: N/A;  ? COLONOSCOPY WITH PROPOFOL N/A 07/08/2016  ? Procedure: COLONOSCOPY WITH PROPOFOL;  Surgeon: [name], MD;  Location: [facility] ENDOSCOPY;  Service: Endoscopy;  Laterality: N/A;  ? COLONOSCOPY WITH PROPOFOL N/A 08/20/2017  ? Procedure: COLONOSCOPY WITH PROPOFOL;  Surgeon: [name], MD;  [facility];  Service: Gastroenterology;  Laterality: N/A;  ? colonscopy    ? CORONARY ANGIOPLASTY  05/28/2014  ? stent placement to the mid RCA  ? CYSTOURETHROSCOPY    ? [facility] urology with stent placement 11 or 05/2019 and stent removal 06/13/19   ? DILATION AND CURETTAGE OF UTERUS    ? several in the early 80's  ? ESOPHAGOGASTRODUODENOSCOPY    ? 2012  ? ESOPHAGOGASTRODUODENOSCOPY (EGD) WITH PROPOFOL N/A 03/11/2018  ? Procedure: ESOPHAGOGASTRODUODENOSCOPY (EGD) WITH  PROPOFOL;  Surgeon: [name], MD;  Location: [facility] ENDOSCOPY;  Service: Gastroenterology;  Laterality: N/A;  ? EYE SURGERY    ? bilateral laser 2012  ? EYE SURGERY    ? right  ? EYE SURGERY    ? x4 both eyes  ? GAS INSERTION  09/30/2011  ? Procedure: INSERTION OF GAS;  Surgeon: [name], MD;  Location: [place];  Service: Ophthalmology;  Laterality: Right;  C3F8  ? GAS/FLUID EXCHANGE  09/30/2011  ? Procedure: GAS/FLUID EXCHANGE;  Surgeon: [name], MD;  Location: [place];  Service: Ophthalmology;  Laterality: Right;  ? KIDNEY TRANSPLANT    ? 03/30/19 Dr. Mickel Baas Thomas/Dr. Cristie Hem Zendel/Dr. Gilford Raid; [facility] transplant Dr. Horald Chestnut will be primary [phone] coordinator Judson Roch [place] [phone]  ? KIDNEY TRANSPLANT    ? 03/30/19 [facility]  ? LEFT HEART CATHETERIZATION WITH CORONARY ANGIOGRAM N/A 05/28/2013  ? Procedure: LEFT HEART CATHETERIZATION WITH CORONARY ANGIOGRAM;  Surgeon: [name], MD;  Location: [facility] CATH LAB;  Service: Cardiovascular;  Laterality: N/A;  ? PARS PLANA VITRECTOMY  04/22/2011  ? Procedure: PARS PLANA VITRECTOMY WITH 25 GAUGE;  Surgeon: [name], MD;  Location: [place];  Service: Ophthalmology;  Laterality: Left;  membrane peel, endolaser, gas fluid exchange, silicone oil, repair of complex traction retinal detachment  ? PARS PLANA VITRECTOMY  09/30/2011  ? Procedure: PARS PLANA VITRECTOMY WITH 25 GAUGE;  Surgeon: [name], MD;  Location: [place];  Service: Ophthalmology;  Laterality: Right;  Endolaser; Repair of Complex Traction Retinal Detachment  ? PARS PLANA VITRECTOMY  02/24/2012  ? Procedure: PARS PLANA VITRECTOMY WITH 25 GAUGE;  Surgeon: [name], MD;  Location: [place];  Service: Ophthalmology;  Laterality: Left;  ? PTCA    ? SILICON OIL REMOVAL  88/89/1694  ? Procedure: SILICON OIL REMOVAL;  Surgeon: [name], MD;  Location: [place];  Service: Ophthalmology;  Laterality: Left;  ? THROMBECTOMY / ARTERIOVENOUS GRAFT REVISION    ? TUBAL LIGATION    ?  1979  ? UPPER EXTREMITY ANGIOGRAPHY Left 02/08/2019  ? Procedure: UPPER EXTREMITY ANGIOGRAPHY;  Surgeon: Katha Cabal, MD;  Location: [place] CV LAB;  Service: Cardiovascular;  Laterality: Left;  ? ? ?Prior to Admission medications   ?Medication Sig Start Date End Date Taking? Authorizing Provider  ?amLODipine (NORVASC) 10 MG tablet Take 1 tablet (10 mg total) by mouth daily. 01/11/21  Yes Minna Merritts, MD  ?cloNIDine (CATAPRES) 0.1 MG tablet Take 2 tablets (0.2 mg total) by mouth 2 (two) times daily. May take an extra 0.1 mg clonidine for blood pressure >160 06/11/21  Yes Gollan, Kathlene November, MD  ?esomeprazole (  NEXIUM) 20 MG capsule Take 20 mg by mouth daily. 04/16/20  Yes [provider]  ?metFORMIN (GLUCOPHAGE) 500 MG tablet Take by mouth. 05/22/21 05/22/22 Yes [provider]  ?tacrolimus ER (ENVARSUS XR) 4 MG TB24 Take 8 mg by mouth daily before breakfast. Total dose is 9 mg daily in the morning.   Yes [provider]  ?acetaminophen (TYLENOL) 500 MG tablet Take 1,000 mg by mouth daily as needed. 03/31/19   [provider]  ?acetaminophen (TYLENOL) 500 MG tablet Take by mouth. 03/31/19   [provider]  ?albuterol (PROVENTIL) (2.5 MG/3ML) 0.083% nebulizer solution Take 3 mLs (2.5 mg total) by nebulization every 6 (six) hours as needed for wheezing or shortness of breath. 12/04/20   McLean-Scocuzza, Nino Glow, MD  ?albuterol (VENTOLIN HFA) 108 (90 Base) MCG/ACT inhaler Inhale 1-2 puffs into the lungs every 6 (six) hours as needed for wheezing or shortness of breath. 12/04/20   McLean-Scocuzza, Nino Glow, MD  ?aspirin EC 81 MG tablet Take 81 mg by mouth daily. Swallow whole.    [provider]  ?atorvastatin (LIPITOR) 20 MG tablet Take 1 tablet (20 mg total) by mouth daily. 01/11/21   Minna Merritts, MD  ?ENVARSUS XR 1 MG TB24 Take 1 mg by mouth daily as needed. Total dose is 9 mg every morning at 0900. 01/18/21   [provider]  ?ferrous  sulfate 325 (65 FE) MG EC tablet Take 1 tablet (325 mg total) by mouth 3 (three) times daily with meals. 07/16/21   Minna Merritts, MD  ?furosemide (LASIX) 40 MG tablet Take 1 tablet (40 mg) by mouth once daily,

## 2021-09-06 NOTE — Op Note (Signed)
[facility] ?Gastroenterology ?Patient Name: Theresa [name] ?Procedure Date: 09/06/2021 1:27 PM ?MRN: [id number] ?Account #: [id number] ?Date of Birth: [birth year] ?Admit Type: Outpatient ?Age: 66 ?Room: [facility] ENDO ROOM 3 ?Gender: Female ?Note Status: Finalized ?Instrument Name: Upper Endoscope [id number] ?Procedure:             Upper GI endoscopy ?Indications:           Dysphagia ?Providers:             [name] MD, MD ?Referring MD:          Nino Glow Mclean-Scocuzza MD, MD (Referring MD) ?Medicines:             Monitored Anesthesia Care ?Complications:         No immediate complications. ?Procedure:             Pre-Anesthesia Assessment: ?                       - Prior to the procedure, a History and Physical was  ?                       performed, and patient medications, allergies and  ?                       sensitivities were reviewed. The patient's tolerance  ?                       of previous anesthesia was reviewed. ?                       - The risks and benefits of the procedure and the  ?                       sedation options and risks were discussed with the  ?                       patient. All questions were answered and informed  ?                       consent was obtained. ?                       - ASA Grade Assessment: II - A patient with mild  ?                       systemic disease. ?                       After obtaining informed consent, the endoscope was  ?                       passed under direct vision. Throughout the procedure,  ?                       the patient's blood pressure, pulse, and oxygen  ?                       saturations were monitored continuously. The Endoscope  ?                       was  introduced through the mouth, and advanced to the  ?                       third part of duodenum. The upper GI endoscopy was  ?                       accomplished with ease. The patient tolerated the  ?                       procedure well. ?Findings: ?     The examined  duodenum was normal. ?     The stomach was normal. ?     The cardia and gastric fundus were normal on retroflexion. ?     The examined esophagus was normal. Biopsies were taken with a cold  ?     forceps for histology. ?     The cardia and gastric fundus were normal on retroflexion. ?Impression:            - Normal examined duodenum. ?                       - Normal stomach. ?                       - Normal esophagus. Biopsied. ?Recommendation:        - Await pathology results. ?                       - Perform a colonoscopy today. ?Procedure Code(s):     --- Professional --- ?                       [phone], Esophagogastroduodenoscopy, flexible,  ?                       transoral; with biopsy, single or multiple ?Diagnosis Code(s):     --- Professional --- ?                       R13.10, Dysphagia, unspecified ?CPT copyright 2019 American Medical Association. All rights reserved. ?The codes documented in this report are preliminary and upon coder review may  ?be revised to meet current compliance requirements. ?[name], MD ?[name] MD, MD ?09/06/2021 1:37:12 PM ?This report has been signed electronically. ?Number of Addenda: 0 ?Note Initiated On: 09/06/2021 1:27 PM ?Estimated Blood Loss:  Estimated blood loss: none. ?     [facility] ?

## 2021-09-06 NOTE — Op Note (Signed)
[facility] ?Gastroenterology ?Patient Name: Theresa [name] ?Procedure Date: 09/06/2021 1:25 PM ?MRN: [id number] ?Account #: [id number] ?Date of Birth: [birth year] ?Admit Type: Outpatient ?Age: 66 ?Room: [facility] ENDO ROOM 3 ?Gender: Female ?Note Status: Finalized ?Instrument Name: Colonoscope [id number] ?Procedure:             Colonoscopy ?Indications:           Surveillance: Personal history of adenomatous polyps  ?                       on last colonoscopy 3 years ago ?Providers:             [name] MD, MD ?Referring MD:          Nino Glow Mclean-Scocuzza MD, MD (Referring MD) ?Medicines:             Monitored Anesthesia Care ?Complications:         No immediate complications. ?Procedure:             Pre-Anesthesia Assessment: ?                       - Prior to the procedure, a History and Physical was  ?                       performed, and patient medications, allergies and  ?                       sensitivities were reviewed. The patient's tolerance  ?                       of previous anesthesia was reviewed. ?                       - The risks and benefits of the procedure and the  ?                       sedation options and risks were discussed with the  ?                       patient. All questions were answered and informed  ?                       consent was obtained. ?                       - ASA Grade Assessment: II - A patient with mild  ?                       systemic disease. ?                       After obtaining informed consent, the colonoscope was  ?                       passed under direct vision. Throughout the procedure,  ?                       the patient's blood pressure, pulse, and oxygen  ?  saturations were monitored continuously. The  ?                       Colonoscope was introduced through the anus and  ?                       advanced to the the cecum, identified by the  ?                       appendiceal orifice. The colonoscopy was performed  ?                        with ease. The patient tolerated the procedure well.  ?                       The quality of the bowel preparation was good. ?Findings: ?     The perianal and digital rectal examinations were normal. ?     Three sessile polyps were found in the cecum. The polyps were 3 to 4 mm  ?     in size. These polyps were removed with a cold biopsy forceps. Resection  ?     and retrieval were complete. ?     Four sessile polyps were found in the ascending colon. The polyps were 4  ?     to 6 mm in size. These polyps were removed with a cold snare. Resection  ?     and retrieval were complete. ?     Multiple small and large-mouthed diverticula were found in the left  ?     colon. ?     The exam was otherwise without abnormality. ?     Internal hemorrhoids were found during endoscopy. The hemorrhoids were  ?     medium-sized and Grade I (internal hemorrhoids that do not prolapse). ?Impression:            - Three 3 to 4 mm polyps in the cecum, removed with a  ?                       cold biopsy forceps. Resected and retrieved. ?                       - Four 4 to 6 mm polyps in the ascending colon,  ?                       removed with a cold snare. Resected and retrieved. ?                       - Diverticulosis in the left colon. ?                       - The examination was otherwise normal. ?                       - Internal hemorrhoids. ?Recommendation:        - Discharge patient to home (with escort). ?                       - Resume previous diet. ?                       -  Continue present medications. ?                       - Await pathology results. ?                       - Repeat colonoscopy in 3 years for surveillance based  ?                       on pathology results. ?Procedure Code(s):     --- Professional --- ?                       [phone], Colonoscopy, flexible; with removal of  ?                       tumor(s), polyp(s), or other lesion(s) by snare  ?                       technique ?                        45380, 59, Colonoscopy, flexible; with biopsy, single  ?                       or multiple ?Diagnosis Code(s):     --- Professional --- ?                       Z86.010, Personal history of colonic polyps ?                       K63.5, Polyp of colon ?                       K64.0, First degree hemorrhoids ?                       K57.30, Diverticulosis of large intestine without  ?                       perforation or abscess without bleeding ?CPT copyright 2019 American Medical Association. All rights reserved. ?The codes documented in this report are preliminary and upon coder review may  ?be revised to meet current compliance requirements. ?[name], MD ?[name] MD, MD ?09/06/2021 1:58:55 PM ?This report has been signed electronically. ?Number of Addenda: 0 ?Note Initiated On: 09/06/2021 1:25 PM ?Scope Withdrawal Time: 0 hours 14 minutes 56 seconds  ?Total Procedure Duration: 0 hours 17 minutes 9 seconds  ?Estimated Blood Loss:  Estimated blood loss: none. ?     [facility] ?

## 2021-09-06 NOTE — Transfer of Care (Signed)
Immediate Anesthesia Transfer of Care Note ? ?Patient: Theresa [name] ? ?Procedure(s) Performed: COLONOSCOPY WITH PROPOFOL ?ESOPHAGOGASTRODUODENOSCOPY (EGD) ? ?Patient Location: PACU ? ?Anesthesia Type:General ? ?Level of Consciousness: sedated ? ?Airway & Oxygen Therapy: Patient Spontanous Breathing and Patient connected to nasal cannula oxygen ? ?Post-op Assessment: Report given to RN and Post -op Vital signs reviewed and stable ? ?Post vital signs: Reviewed and stable ? ?Last Vitals:  ?Vitals Value Taken Time  ?BP 154/43 09/06/21 1400  ?Temp    ?Pulse 68 09/06/21 1401  ?Resp 16 09/06/21 1401  ?SpO2 100 % 09/06/21 1401  ?Vitals shown include unvalidated device data. ? ?Last Pain:  ?Vitals:  ? 09/06/21 1231  ?TempSrc: Temporal  ?PainSc: 0-No pain  ?   ? ?  ? ?Complications: No notable events documented. ?

## 2021-09-09 ENCOUNTER — Encounter: Payer: Self-pay | Admitting: Gastroenterology

## 2021-09-10 LAB — SURGICAL PATHOLOGY

## 2021-09-13 MED ORDER — ERGOCALCIFEROL (VITAMIN D2) 1,250 MCG (50,000 UNIT) CAPSULE
ORAL_CAPSULE | ORAL | 2 refills | 28 days
Start: 2021-09-13 — End: 2022-09-13

## 2021-09-13 NOTE — Unmapped (Signed)
[facility] Refill Coordination Note    Specialty Medication(s) to be Shipped:   Transplant:  mycophenolic acid 180mg     Other medication(s) to be shipped:     Vit D3  lyrica  Sodium Bicarb       Michele Cisneros, DOB: August 08, 1955  Phone: [phone] (home)       All above HIPAA information was verified with patient.     Was a Nurse, learning disability used for this call? No    Completed refill call assessment today to schedule patient's medication shipment from the [facility] [phone]).  All relevant notes have been reviewed.     Specialty medication(s) and dose(s) confirmed: Regimen is correct and unchanged.   Changes to medications: Ambrea reports no changes at this time.  Changes to insurance: No  New side effects reported not previously addressed with a pharmacist or physician: None reported  Questions for the pharmacist: No    Confirmed patient received a Conservation officer, historic buildings and a Surveyor, mining with first shipment. The patient will receive a drug information handout for each medication shipped and additional FDA Medication Guides as required.       DISEASE/MEDICATION-SPECIFIC INFORMATION        N/A    SPECIALTY MEDICATION ADHERENCE     Medication Adherence    Patient reported X missed doses in the last month: 0  Specialty Medication: mycophenolate 180 MG EC tablet (MYFORTIC)  Patient is on additional specialty medications: No           mycophenolic acid 180mg  8 days worth of medication on hand.      Were doses missed due to medication being on hold? No          REFERRAL TO PHARMACIST     Referral to the pharmacist: Not needed      [facility]     Shipping address confirmed in Epic.     Delivery Scheduled: Yes, Expected medication delivery date: 09/17/21.     Medication will be delivered via UPS to the prescription address in Epic WAM.    [place] A Hiroko Tregre   [facility] Specialty Technician

## 2021-09-16 ENCOUNTER — Encounter: Payer: Self-pay | Admitting: Gastroenterology

## 2021-09-16 MED ORDER — ERGOCALCIFEROL (VITAMIN D2) 1,250 MCG (50,000 UNIT) CAPSULE
ORAL_CAPSULE | ORAL | 2 refills | 28 days | Status: CP
Start: 2021-09-16 — End: 2022-09-16
  Filled 2021-09-16: qty 4, 28d supply, fill #0

## 2021-09-16 MED FILL — MYCOPHENOLATE SODIUM 180 MG TABLET,DELAYED RELEASE: ORAL | 30 days supply | Qty: 60 | Fill #2

## 2021-09-16 MED FILL — PREGABALIN 75 MG CAPSULE: ORAL | 30 days supply | Qty: 90 | Fill #3

## 2021-09-16 MED FILL — SODIUM BICARBONATE 650 MG TABLET: ORAL | 30 days supply | Qty: 60 | Fill #5

## 2021-09-16 NOTE — Unmapped (Signed)
The patient is requesting a medication refill

## 2021-10-11 NOTE — Progress Notes (Signed)
Cardiology Office Note ? ?Date:  10/14/2021  ? ?ID:  Theresa [name], DOB May 18, 1956, MRN [id number] ? ?PCP:  McLean-Scocuzza, Nino Glow, MD  ? ?Chief Complaint  ?Patient presents with  ? 3 month follow up   ?  Patient c/o shortness of breath and chest tightness at times. Medications reviewed by the patient verbally.   ? ? ?HPI:  ?Theresa [name] is a pleasant 66 -year old woman with  ?CAD, Drug-eluting stent placed December 2014 to the mid RCA ?Repeat cath 2017 for positive stress test at [facility], medical management ?25 years of smoking (quit in 2013-2014),  ?diabetes,  ?chronic renal insufficiency,   ?moderate to severe mitral valve regurgitation on prior echocardiogram (improved to moderate MR on subsequent echo), ?ESRD on dialysis since February 2014, 3 days per week. ?59% bilateral carotid arterial disease as of 2017  ?Positive stress test  2016/04/02 at [facility] ?Cardic catheterization 2017 medical management recommended ?Renal transplant 2020 ?PET Myocardial Stress was performed on 06/19/2021 which showed no clear evidence of any significant ischemia ?She presents today for follow-up of her coronary artery disease  ? ?Last seen by myself in clinic February 2023 ? ?In the hospital emergency room December 2022 for shortness of breath ? ?Hospital records from [facility] reviewed, long hospitalization for COVID ?Had cardiac work-up including echo and PET stress ? ?In follow-up today reports having Little bit of chest tightness, used inhaler may have improved ?Rarely has to take nitroglycerin for chest pain but does report some chest discomfort at times ? ?Weight trending down, down 7 pounds since August 2022 ?Eating less ?Feels blood pressure well controlled ?Diastolic pressure always lower since she was in the hospital in January 2023 ? ?Lab work reviewed ?Hemoglobin 10.2 ?Creatinine 1.35 BUN 25 ?Total cholesterol 139 LDL 52 ? ?EKG personally reviewed by myself on todays visit ?Normal sinus rhythm rate 80 bpm nonspecific ST and T wave  abnormality ? ?admission to [facility] chest pain  06/15/21 to 1/20 ?macrocytic anemia (H/H 7.9/23.8), hyperkalemia (5.5), low bicarb (19), elevate creatinine (1.24), hypoalbuminemia (2.8), elevated BNP (854 ?CXR showed findings suggestive of pulmonary edema  ?started on lasix IV  ? ?PET Myocardial Stress was performed on 06/19/2021 which showed no clear evidence of any significant ischemia or scar, normal left ventricular systolic function (post stress ejection fraction is > 60%), coronary and aortic calcifications, small bilateral pleural effusions, trace pericardial effusion and diffuse pulmonary edema. ? ?TTE in 05/2021 showed normal LV structure and function, grade I diastolic dysfunction, moderate LVH, and normal RV structure and function. Her TTE this hospitalization showed mildly thickened LV wall, normal LV function (LVEF >60-65%), elevated LV end-diastolic pressure, mild-to-moderate MR / MS / AR / aortic stenosis, normal RV structure and function, and mild PAH ? ?Klebsiella / Ecoli UTI  ?COVID-19 - Likely contracted in hospital as she was negative on admission ?received a course of remdesivir  ? ?iron studies this hospitalization showed findings consistent with ACD with a % saturation of 13. ? ?D/c  06/28/21 ? ?EKG personally reviewed by myself on todays visit ?Normal sinus rhythm rate 79 bpm LVH ? ?Other past medical history reviewed ?Myoview January 2019 at [facility] no ischemia ?Echocardiogram February 2019 [facility] moderate MR, normal ejection fraction ?Tolerating hemodialysis Monday Wednesday Friday ?Echocardiogram July 2020 normal ejection fraction ?[facility] 04/03/2019 deceased donor renal transplant ? ?hospital November 2021 ?Hypertensive urgency, flash pulmonary edema, mitral valve regurgitation ?She had stopped several of her blood pressure medications, these were restarted with improved blood pressure ?Now  on amlodipine carvedilol clonidine ?Sublingual nitro for any spikes in pressure ? ?Cardic catheterization 2017  showed 70% disease ostial right PDA, unchanged from 2014 cardiac catheterization otherwise other regions of 20-30% disease, nonobstructive ? ?PMH:   has a past medical history of Anemia of chronic disease, Anginal pain ([place]), Carotid arterial disease ([place]), Chronic constipation, Chronic diastolic CHF (congestive heart failure) ([place]), Colon polyps, COPD (chronic obstructive pulmonary disease) ([place]), Coronary artery disease, COVID-19, Diabetes mellitus, Diabetic neuropathy ([place]), Diabetic retinopathy ([place]) (05/28/2013), Emphysema, ESRD on hemodialysis ([place]), History of bronchitis, History of pneumonia, History of tobacco abuse, Hyperlipidemia, Hypertension, Moderate to severe mitral insufficiency, Myocardial infarct [facility]) (05/2013), Peripheral vascular disease ([place]), Sickle cell trait ([place]), Thyroid nodule, and Valvular heart disease. ? ?PSH:    ?Past Surgical History:  ?Procedure Laterality Date  ? A/V FISTULAGRAM Left 11/10/2017  ? Procedure: A/V FISTULAGRAM;  Surgeon: Katha Cabal, MD;  Location: [place] [facility];  Service: Cardiovascular;  Laterality: Left;  ? A/V SHUNTOGRAM Left 02/08/2019  ? Procedure: A/V SHUNTOGRAM;  Surgeon: Katha Cabal, MD;  Location: [place] [facility];  Service: Cardiovascular;  Laterality: Left;  ? ABDOMINAL HYSTERECTOMY    ? 2000 for cysts on ovaries total no abnormal pap  ? CARDIAC CATHETERIZATION    ? CARDIAC CATHETERIZATION N/A 05/02/2015  ? Procedure: Left Heart Cath and Coronary Angiography;  Surgeon: [name], MD;  Location: [place] [facility];  Service: Cardiovascular;  Laterality: N/A;  ? COLONOSCOPY WITH PROPOFOL N/A 07/08/2016  ? Procedure: COLONOSCOPY WITH PROPOFOL;  Surgeon: [name], MD;  Location: [facility] ENDOSCOPY;  Service: Endoscopy;  Laterality: N/A;  ? COLONOSCOPY WITH PROPOFOL N/A 08/20/2017  ? Procedure: COLONOSCOPY WITH PROPOFOL;  Surgeon: [name], MD;  Location: [facility] ENDOSCOPY;  Service: Gastroenterology;  Laterality: N/A;  ?  COLONOSCOPY WITH PROPOFOL N/A 09/06/2021  ? Procedure: COLONOSCOPY WITH PROPOFOL;  Surgeon: [name], MD;  Location: [facility] ENDOSCOPY;  Service: Gastroenterology;  Laterality: N/A;  ? colonscopy    ? CORONARY ANGIOPLASTY  05/28/2014  ? stent placement to the mid RCA  ? CYSTOURETHROSCOPY    ? [facility] urology with stent placement 11 or 05/2019 and stent removal 06/13/19   ? DILATION AND CURETTAGE OF UTERUS    ? several in the early 80's  ? ESOPHAGOGASTRODUODENOSCOPY    ? 2012  ? ESOPHAGOGASTRODUODENOSCOPY N/A 09/06/2021  ? Procedure: ESOPHAGOGASTRODUODENOSCOPY (EGD);  Surgeon: [name], MD;  Location: [facility] ENDOSCOPY;  Service: Gastroenterology;  Laterality: N/A;  ? ESOPHAGOGASTRODUODENOSCOPY (EGD) WITH PROPOFOL N/A 03/11/2018  ? Procedure: ESOPHAGOGASTRODUODENOSCOPY (EGD) WITH PROPOFOL;  Surgeon: [name], MD;  Location: [facility] ENDOSCOPY;  Service: Gastroenterology;  Laterality: N/A;  ? EYE SURGERY    ? bilateral laser 2012  ? EYE SURGERY    ? right  ? EYE SURGERY    ? x4 both eyes  ? GAS INSERTION  09/30/2011  ? Procedure: INSERTION OF GAS;  Surgeon: [name], MD;  Location: [place];  Service: Ophthalmology;  Laterality: Right;  C3F8  ? GAS/FLUID EXCHANGE  09/30/2011  ? Procedure: GAS/FLUID EXCHANGE;  Surgeon: [name], MD;  Location: [place];  Service: Ophthalmology;  Laterality: Right;  ? KIDNEY TRANSPLANT    ? 03/30/19 Dr. Mickel Baas Thomas/Dr. Cristie Hem Zendel/Dr. Gilford Raid; [facility] transplant Dr. Horald Chestnut will be primary [phone] coordinator Judson Roch [place] [phone]  ? KIDNEY TRANSPLANT    ? 03/30/19 [facility]  ? LEFT HEART CATHETERIZATION WITH CORONARY ANGIOGRAM N/A 05/28/2013  ? Procedure: LEFT HEART  CATHETERIZATION WITH CORONARY ANGIOGRAM;  Surgeon: [name], MD;  Location: [facility] CATH LAB;  Service: Cardiovascular;  Laterality: N/A;  ? PARS PLANA VITRECTOMY  04/22/2011  ? Procedure: PARS PLANA VITRECTOMY WITH 25 GAUGE;  Surgeon: [name], MD;  Location: [place];  Service: Ophthalmology;  Laterality:  Left;  membrane peel, endolaser, gas fluid exchange, silicone oil, repair of complex traction retinal detachment  ? PARS PLANA VITRECTOMY  09/30/2011  ? Procedure: PARS PLANA VITRECTOMY WITH 25 GAUGE;  Surgeon: Rosanna Randy

## 2021-10-14 ENCOUNTER — Telehealth: Payer: Self-pay

## 2021-10-14 ENCOUNTER — Encounter: Payer: Self-pay | Admitting: Cardiovascular Disease

## 2021-10-14 ENCOUNTER — Ambulatory Visit: Payer: Medicare Other | Admitting: Cardiovascular Disease

## 2021-10-14 VITALS — BP 140/48 | HR 80 | Ht 67.5 in | Wt 151.5 lb

## 2021-10-14 DIAGNOSIS — I739 Peripheral vascular disease, unspecified: Secondary | ICD-10-CM

## 2021-10-14 DIAGNOSIS — E785 Hyperlipidemia, unspecified: Secondary | ICD-10-CM | POA: Diagnosis not present

## 2021-10-14 DIAGNOSIS — I10 Essential (primary) hypertension: Secondary | ICD-10-CM | POA: Diagnosis not present

## 2021-10-14 DIAGNOSIS — E782 Mixed hyperlipidemia: Secondary | ICD-10-CM

## 2021-10-14 DIAGNOSIS — E1122 Type 2 diabetes mellitus with diabetic chronic kidney disease: Secondary | ICD-10-CM

## 2021-10-14 DIAGNOSIS — I25118 Atherosclerotic heart disease of native coronary artery with other forms of angina pectoris: Secondary | ICD-10-CM

## 2021-10-14 DIAGNOSIS — N186 End stage renal disease: Secondary | ICD-10-CM | POA: Diagnosis not present

## 2021-10-14 DIAGNOSIS — N183 Chronic kidney disease, stage 3 unspecified: Secondary | ICD-10-CM

## 2021-10-14 DIAGNOSIS — Z940 Kidney transplant status: Secondary | ICD-10-CM | POA: Diagnosis not present

## 2021-10-14 DIAGNOSIS — I340 Nonrheumatic mitral (valve) insufficiency: Secondary | ICD-10-CM | POA: Diagnosis not present

## 2021-10-14 DIAGNOSIS — I351 Nonrheumatic aortic (valve) insufficiency: Secondary | ICD-10-CM

## 2021-10-14 DIAGNOSIS — Z794 Long term (current) use of insulin: Secondary | ICD-10-CM

## 2021-10-14 DIAGNOSIS — I5032 Chronic diastolic (congestive) heart failure: Secondary | ICD-10-CM

## 2021-10-14 DIAGNOSIS — I6523 Occlusion and stenosis of bilateral carotid arteries: Secondary | ICD-10-CM

## 2021-10-14 MED ORDER — PREGABALIN 75 MG CAPSULE
ORAL_CAPSULE | ORAL | 3 refills | 30 days | Status: CP
Start: 2021-10-14 — End: ?
  Filled 2021-10-16: qty 90, 30d supply, fill #0

## 2021-10-14 MED ORDER — CLONIDINE HCL 0.1 MG PO TABS: 0.1000 mg | ORAL_TABLET | Freq: Two times a day (BID) | ORAL | 3 refills | Status: DC

## 2021-10-14 MED ORDER — ISOSORBIDE MONONITRATE ER 30 MG PO TB24: 30.0000 mg | ORAL_TABLET | Freq: Every day | ORAL | 3 refills | Status: DC

## 2021-10-14 NOTE — Patient Instructions (Addendum)
Medication Instructions:  ?Please start isosorbide 30 mg once a day ?Decrease the clonidine down to 0.1 mg twice a day (previously was 0.2 mg twice a day) ? ?Call if chest pain gets worse ? ? ?If you need a refill on your cardiac medications before your next appointment, please call your pharmacy.  ? ?Lab work: ?No new labs needed ? ?Testing/Procedures: ?No new testing needed ? ?Follow-Up: ?At [facility], you and your health needs are our priority.  As part of our continuing mission to provide you with exceptional heart care, we have created designated Provider Care Teams.  These Care Teams include your primary Cardiologist (physician) and Advanced Practice Providers (APPs -  Physician Assistants and Nurse Practitioners) who all work together to provide you with the care you need, when you need it. ? ?You will need a follow up appointment in 6 months ? ?Providers on your designated Care Team:   ?Murray Hodgkins, NP ?Christell Faith, PA-C ?Cadence Kathlen Mody, PA-C ? ?COVID-19 Vaccine Information can be found at: ShippingScam.co.uk For questions related to vaccine distribution or appointments, please email [email or web address] or call [phone].  ? ?

## 2021-10-14 NOTE — Telephone Encounter (Signed)
Disability forms were dropped off and they are in Dr. Audrie Gallus color folder. ?

## 2021-10-14 NOTE — Unmapped (Signed)
[facility] Refill Coordination Note    Specialty Medication(s) to be Shipped:   Transplant: mycophenolate mofetil 180mg     Other medication(s) to be shipped: Amlodipine 10mg ,Atorvastatin 20mg ,Vit D,Esomeprazole 20mg ,Furosemide 40mg ,Metformin 500mg ,Pregablin ,Sodium Bicarb     Michele Cisneros, DOB: [birth year]  Phone: [phone] (home)       All above HIPAA information was verified with patient.     Was a Nurse, learning disability used for this call? No    Completed refill call assessment today to schedule patient's medication shipment from the [facility] [phone]).  All relevant notes have been reviewed.     Specialty medication(s) and dose(s) confirmed: Regimen is correct and unchanged.   Changes to medications: Aalijah reports no changes at this time.  Changes to insurance: No  New side effects reported not previously addressed with a pharmacist or physician: None reported  Questions for the pharmacist: No    Confirmed patient received a Conservation officer, historic buildings and a Surveyor, mining with first shipment. The patient will receive a drug information handout for each medication shipped and additional FDA Medication Guides as required.       DISEASE/MEDICATION-SPECIFIC INFORMATION        N/A    SPECIALTY MEDICATION ADHERENCE     Medication Adherence    Patient reported X missed doses in the last month: 0  Specialty Medication: mycophenolate 180 MG EC tablet (MYFORTIC)  Patient is on additional specialty medications: No  Patient is on more than two specialty medications: No  Any gaps in refill history greater than 2 weeks in the last 3 months: no  Demonstrates understanding of importance of adherence: yes  Informant: patient  Reliability of informant: reliable  Provider-estimated medication adherence level: good  Patient is at risk for Non-Adherence: No  Reasons for non-adherence: no problems identified  Confirmed plan for next specialty medication refill: delivery by pharmacy  Refills needed for supportive medications: not needed          Refill Coordination    Has the Patients' Contact Information Changed: No  Is the Shipping Address Different: No         Were doses missed due to medication being on hold? No    Mycophenolate 180mg  : 7 days of medicine on hand         REFERRAL TO PHARMACIST     Referral to the pharmacist: Not needed      [facility]     Shipping address confirmed in Epic.     Delivery Scheduled: Yes, Expected medication delivery date: 05/11.     Medication will be delivered via UPS to the prescription address in Epic WAM.    Antonietta Barcelona   [facility] Specialty Technician

## 2021-10-16 MED FILL — ERGOCALCIFEROL (VITAMIN D2) 1,250 MCG (50,000 UNIT) CAPSULE: ORAL | 28 days supply | Qty: 4 | Fill #1

## 2021-10-16 MED FILL — FUROSEMIDE 40 MG TABLET: ORAL | 30 days supply | Qty: 60 | Fill #2

## 2021-10-16 MED FILL — MYCOPHENOLATE SODIUM 180 MG TABLET,DELAYED RELEASE: ORAL | 30 days supply | Qty: 60 | Fill #3

## 2021-10-16 MED FILL — SODIUM BICARBONATE 650 MG TABLET: ORAL | 30 days supply | Qty: 60 | Fill #6

## 2021-10-16 MED FILL — ESOMEPRAZOLE MAGNESIUM 20 MG CAPSULE,DELAYED RELEASE: ORAL | 90 days supply | Qty: 90 | Fill #2

## 2021-10-16 MED FILL — AMLODIPINE 10 MG TABLET: ORAL | 30 days supply | Qty: 30 | Fill #1

## 2021-10-16 MED FILL — METFORMIN 500 MG TABLET: ORAL | 30 days supply | Qty: 60 | Fill #2

## 2021-10-16 MED FILL — ATORVASTATIN 20 MG TABLET: ORAL | 90 days supply | Qty: 90 | Fill #2

## 2021-10-23 NOTE — Telephone Encounter (Signed)
Forms ready see instructions ? ?

## 2021-10-24 DIAGNOSIS — E11319 Type 2 diabetes mellitus with unspecified diabetic retinopathy without macular edema: Secondary | ICD-10-CM | POA: Diagnosis not present

## 2021-10-24 LAB — HM DIABETES EYE EXAM

## 2021-10-25 ENCOUNTER — Encounter: Payer: Self-pay | Admitting: Internal Medicine

## 2021-10-31 LAB — HM DIABETES EYE EXAM

## 2021-11-05 DIAGNOSIS — Z940 Kidney transplant status: Principal | ICD-10-CM

## 2021-11-05 DIAGNOSIS — R918 Other nonspecific abnormal finding of lung field: Principal | ICD-10-CM

## 2021-11-05 DIAGNOSIS — Z129 Encounter for screening for malignant neoplasm, site unspecified: Principal | ICD-10-CM

## 2021-11-11 NOTE — Unmapped (Signed)
[facility] Refill Coordination Note    Specialty Medication(s) to be Shipped:   Transplant: mycophenolate mofetil 180mg     Other medication(s) to be shipped: AMLODIPINE,VIT D,FUROSEMIDE 40MG ,METFORMIN,PREGABLIN SODIUM BICARB     Michele Cisneros, DOB: 1956-04-07  Phone: [phone] (home)       All above HIPAA information was verified with patient.     Was a Nurse, learning disability used for this call? No    Completed refill call assessment today to schedule patient's medication shipment from the [facility] [phone]).  All relevant notes have been reviewed.     Specialty medication(s) and dose(s) confirmed: Regimen is correct and unchanged.   Changes to medications: Hayli reports no changes at this time.  Changes to insurance: No  New side effects reported not previously addressed with a pharmacist or physician: None reported  Questions for the pharmacist: No    Confirmed patient received a Conservation officer, historic buildings and a Surveyor, mining with first shipment. The patient will receive a drug information handout for each medication shipped and additional FDA Medication Guides as required.       DISEASE/MEDICATION-SPECIFIC INFORMATION        N/A    SPECIALTY MEDICATION ADHERENCE     Medication Adherence    Patient reported X missed doses in the last month: 0  Specialty Medication: Mycophenolate 180mg   Patient is on additional specialty medications: No  Patient is on more than two specialty medications: No  Any gaps in refill history greater than 2 weeks in the last 3 months: no  Demonstrates understanding of importance of adherence: yes  Informant: patient  Reliability of informant: reliable  Provider-estimated medication adherence level: good  Patient is at risk for Non-Adherence: No  Reasons for non-adherence: no problems identified  Confirmed plan for next specialty medication refill: delivery by pharmacy  Refills needed for supportive medications: not needed          Refill Coordination Has the Patients' Contact Information Changed: No  Is the Shipping Address Different: No         Were doses missed due to medication being on hold? No    MYCOPHENOLATE 180 mg: 5 days of medicine on hand         REFERRAL TO PHARMACIST     Referral to the pharmacist: Not needed      [facility]     Shipping address confirmed in Epic.     Delivery Scheduled: Yes, Expected medication delivery date: 06/07.     Medication will be delivered via Next Day Courier to the prescription address in Epic WAM.    Antonietta Barcelona   [facility] Specialty Technician

## 2021-11-12 MED FILL — FUROSEMIDE 40 MG TABLET: ORAL | 30 days supply | Qty: 60 | Fill #3

## 2021-11-12 MED FILL — AMLODIPINE 10 MG TABLET: ORAL | 30 days supply | Qty: 30 | Fill #2

## 2021-11-12 MED FILL — SODIUM BICARBONATE 650 MG TABLET: ORAL | 30 days supply | Qty: 60 | Fill #7

## 2021-11-12 MED FILL — ERGOCALCIFEROL (VITAMIN D2) 1,250 MCG (50,000 UNIT) CAPSULE: ORAL | 28 days supply | Qty: 4 | Fill #2

## 2021-11-12 MED FILL — METFORMIN 500 MG TABLET: ORAL | 30 days supply | Qty: 60 | Fill #3

## 2021-11-12 MED FILL — PREGABALIN 75 MG CAPSULE: ORAL | 30 days supply | Qty: 90 | Fill #1

## 2021-11-12 MED FILL — MYCOPHENOLATE SODIUM 180 MG TABLET,DELAYED RELEASE: ORAL | 30 days supply | Qty: 60 | Fill #4

## 2021-11-21 ENCOUNTER — Encounter: Payer: Self-pay | Admitting: Podiatry

## 2021-11-21 ENCOUNTER — Ambulatory Visit (INDEPENDENT_AMBULATORY_CARE_PROVIDER_SITE_OTHER): Payer: Medicare Other | Admitting: Podiatry

## 2021-11-21 DIAGNOSIS — B351 Tinea unguium: Secondary | ICD-10-CM | POA: Diagnosis not present

## 2021-11-21 DIAGNOSIS — E1142 Type 2 diabetes mellitus with diabetic polyneuropathy: Secondary | ICD-10-CM | POA: Diagnosis not present

## 2021-11-21 DIAGNOSIS — M79674 Pain in right toe(s): Secondary | ICD-10-CM

## 2021-11-21 DIAGNOSIS — I739 Peripheral vascular disease, unspecified: Secondary | ICD-10-CM

## 2021-11-21 DIAGNOSIS — L608 Other nail disorders: Secondary | ICD-10-CM

## 2021-11-21 DIAGNOSIS — M79675 Pain in left toe(s): Secondary | ICD-10-CM | POA: Diagnosis not present

## 2021-11-21 NOTE — Progress Notes (Signed)
This patient returns to my office for at risk foot care.  This patient requires this care by a professional since this patient will be at risk due to having kidney transplant, PAD, and diabetes.  This patient is unable to cut nails herself since the patient cannot reach her nails.These nails are painful walking and wearing shoes.  This patient presents for at risk foot care today.  General Appearance  Alert, conversant and in no acute stress.  Vascular  Dorsalis pedis and posterior tibial  pulses are  weakly palpable  right foot.  Pulses are absent left foot.  Capillary return is within normal limits  bilaterally. Temperature is within normal limits  bilaterally.  Neurologic  Senn-Weinstein monofilament wire test within normal limits  bilaterally. Muscle power within normal limits bilaterally.  Nails Thick disfigured discolored nails with subungual debris  from hallux to fifth toes bilaterally. No evidence of bacterial infection or drainage bilaterally.  Orthopedic  No limitations of motion  feet .  No crepitus or effusions noted.  No bony pathology or digital deformities noted.  HAV  B/L.  Skin  normotropic skin with no porokeratosis noted bilaterally.  No signs of infections or ulcers noted.     Onychomycosis  Pain in right toes  Pain in left toes  Consent was obtained for treatment procedures.   Mechanical debridement of nails 1-5  bilaterally performed with a nail nipper.  Filed with dremel without incident.    Return office visit   3 months.                  Told patient to return for periodic foot care and evaluation due to potential at risk complications.   Trapper Meech DPM  

## 2021-11-25 DIAGNOSIS — Z940 Kidney transplant status: Principal | ICD-10-CM

## 2021-11-25 DIAGNOSIS — N186 End stage renal disease: Principal | ICD-10-CM

## 2021-11-25 DIAGNOSIS — R7309 Other abnormal glucose: Principal | ICD-10-CM

## 2021-11-27 ENCOUNTER — Ambulatory Visit
Admit: 2021-11-27 | Discharge: 2021-11-30 | Disposition: A | Discharge status: Home / Self Care | Payer: MEDICARE | Source: Ambulatory Visit

## 2021-11-27 ENCOUNTER — Ambulatory Visit: Admit: 2021-11-27 | Discharge: 2021-11-28 | Payer: MEDICARE

## 2021-11-27 ENCOUNTER — Ambulatory Visit: Admit: 2021-11-27 | Discharge: 2021-11-28 | Payer: MEDICARE | Attending: Nephrology | Primary: Nephrology

## 2021-11-27 ENCOUNTER — Ambulatory Visit: Admit: 2021-11-27 | Payer: MEDICARE

## 2021-11-27 DIAGNOSIS — D849 Immunodeficiency, unspecified: Principal | ICD-10-CM

## 2021-11-27 DIAGNOSIS — Z940 Kidney transplant status: Principal | ICD-10-CM

## 2021-11-27 DIAGNOSIS — I739 Peripheral vascular disease, unspecified: Principal | ICD-10-CM

## 2021-11-27 DIAGNOSIS — G4733 Obstructive sleep apnea (adult) (pediatric): Principal | ICD-10-CM

## 2021-11-27 DIAGNOSIS — I25118 Atherosclerotic heart disease of native coronary artery with other forms of angina pectoris: Principal | ICD-10-CM

## 2021-11-27 DIAGNOSIS — I38 Endocarditis, valve unspecified: Principal | ICD-10-CM

## 2021-11-27 DIAGNOSIS — R7309 Other abnormal glucose: Principal | ICD-10-CM

## 2021-11-27 DIAGNOSIS — E782 Mixed hyperlipidemia: Principal | ICD-10-CM

## 2021-11-27 DIAGNOSIS — Z9989 Dependence on other enabling machines and devices: Principal | ICD-10-CM

## 2021-11-27 DIAGNOSIS — N186 End stage renal disease: Principal | ICD-10-CM

## 2021-11-27 DIAGNOSIS — E1151 Type 2 diabetes mellitus with diabetic peripheral angiopathy without gangrene: Secondary | ICD-10-CM | POA: Diagnosis not present

## 2021-11-27 DIAGNOSIS — I5030 Unspecified diastolic (congestive) heart failure: Secondary | ICD-10-CM | POA: Diagnosis not present

## 2021-11-27 DIAGNOSIS — N183 Chronic kidney disease, stage 3 unspecified: Secondary | ICD-10-CM | POA: Diagnosis not present

## 2021-11-27 DIAGNOSIS — T8619 Other complication of kidney transplant: Secondary | ICD-10-CM | POA: Diagnosis not present

## 2021-11-27 DIAGNOSIS — I951 Orthostatic hypotension: Secondary | ICD-10-CM | POA: Diagnosis not present

## 2021-11-27 DIAGNOSIS — K219 Gastro-esophageal reflux disease without esophagitis: Secondary | ICD-10-CM | POA: Diagnosis not present

## 2021-11-27 DIAGNOSIS — R197 Diarrhea, unspecified: Secondary | ICD-10-CM | POA: Diagnosis not present

## 2021-11-27 DIAGNOSIS — D631 Anemia in chronic kidney disease: Secondary | ICD-10-CM | POA: Diagnosis not present

## 2021-11-27 DIAGNOSIS — E860 Dehydration: Secondary | ICD-10-CM | POA: Diagnosis not present

## 2021-11-27 DIAGNOSIS — E861 Hypovolemia: Secondary | ICD-10-CM | POA: Diagnosis not present

## 2021-11-27 DIAGNOSIS — E785 Hyperlipidemia, unspecified: Secondary | ICD-10-CM | POA: Diagnosis not present

## 2021-11-27 DIAGNOSIS — E1140 Type 2 diabetes mellitus with diabetic neuropathy, unspecified: Secondary | ICD-10-CM | POA: Diagnosis not present

## 2021-11-27 DIAGNOSIS — K521 Toxic gastroenteritis and colitis: Secondary | ICD-10-CM | POA: Diagnosis not present

## 2021-11-27 DIAGNOSIS — I110 Hypertensive heart disease with heart failure: Secondary | ICD-10-CM | POA: Diagnosis not present

## 2021-11-27 DIAGNOSIS — I129 Hypertensive chronic kidney disease with stage 1 through stage 4 chronic kidney disease, or unspecified chronic kidney disease: Secondary | ICD-10-CM | POA: Diagnosis not present

## 2021-11-27 DIAGNOSIS — Z7982 Long term (current) use of aspirin: Secondary | ICD-10-CM | POA: Diagnosis not present

## 2021-11-27 DIAGNOSIS — I5032 Chronic diastolic (congestive) heart failure: Secondary | ICD-10-CM | POA: Diagnosis not present

## 2021-11-27 DIAGNOSIS — Z800 Family history of malignant neoplasm of digestive organs: Secondary | ICD-10-CM | POA: Diagnosis not present

## 2021-11-27 DIAGNOSIS — F17210 Nicotine dependence, cigarettes, uncomplicated: Secondary | ICD-10-CM | POA: Diagnosis not present

## 2021-11-27 DIAGNOSIS — D84821 Immunodeficiency due to drugs: Secondary | ICD-10-CM | POA: Diagnosis not present

## 2021-11-27 DIAGNOSIS — I080 Rheumatic disorders of both mitral and aortic valves: Secondary | ICD-10-CM | POA: Diagnosis not present

## 2021-11-27 DIAGNOSIS — J449 Chronic obstructive pulmonary disease, unspecified: Secondary | ICD-10-CM | POA: Diagnosis not present

## 2021-11-27 DIAGNOSIS — R42 Dizziness and giddiness: Secondary | ICD-10-CM | POA: Diagnosis not present

## 2021-11-27 DIAGNOSIS — R7989 Other specified abnormal findings of blood chemistry: Secondary | ICD-10-CM | POA: Diagnosis not present

## 2021-11-27 DIAGNOSIS — R110 Nausea: Secondary | ICD-10-CM | POA: Diagnosis not present

## 2021-11-27 DIAGNOSIS — N179 Acute kidney failure, unspecified: Secondary | ICD-10-CM | POA: Diagnosis not present

## 2021-11-27 DIAGNOSIS — E1122 Type 2 diabetes mellitus with diabetic chronic kidney disease: Secondary | ICD-10-CM | POA: Diagnosis not present

## 2021-11-27 DIAGNOSIS — R112 Nausea with vomiting, unspecified: Secondary | ICD-10-CM | POA: Diagnosis not present

## 2021-11-27 DIAGNOSIS — Z66 Do not resuscitate: Secondary | ICD-10-CM | POA: Diagnosis not present

## 2021-11-27 DIAGNOSIS — Z6822 Body mass index (BMI) 22.0-22.9, adult: Secondary | ICD-10-CM | POA: Diagnosis not present

## 2021-11-27 LAB — BASIC METABOLIC PANEL
ANION GAP: 9 mmol/L (ref 5–14)
BLOOD UREA NITROGEN: 34 mg/dL — ABNORMAL HIGH (ref 9–23)
BUN / CREAT RATIO: 14
CALCIUM: 10 mg/dL (ref 8.7–10.4)
CHLORIDE: 101 mmol/L (ref 98–107)
CO2: 23.9 mmol/L (ref 20.0–31.0)
CREATININE: 2.36 mg/dL — ABNORMAL HIGH
EGFR CKD-EPI (2021) FEMALE: 22 mL/min/{1.73_m2} — ABNORMAL LOW (ref >=60)
GLUCOSE RANDOM: 127 mg/dL — ABNORMAL HIGH (ref 70–99)
POTASSIUM: 4.3 mmol/L (ref 3.4–4.8)
SODIUM: 134 mmol/L — ABNORMAL LOW (ref 135–145)

## 2021-11-27 LAB — CBC W/ AUTO DIFF
BASOPHILS ABSOLUTE COUNT: 0.1 10*9/L (ref 0.0–0.1)
BASOPHILS RELATIVE PERCENT: 0.8 %
EOSINOPHILS ABSOLUTE COUNT: 0.2 10*9/L (ref 0.0–0.5)
EOSINOPHILS RELATIVE PERCENT: 2.6 %
HEMATOCRIT: 36.3 % (ref 34.0–44.0)
HEMOGLOBIN: 12 g/dL (ref 11.3–14.9)
LYMPHOCYTES ABSOLUTE COUNT: 1.1 10*9/L (ref 1.1–3.6)
LYMPHOCYTES RELATIVE PERCENT: 13.7 %
MEAN CORPUSCULAR HEMOGLOBIN CONC: 33 g/dL (ref 32.0–36.0)
MEAN CORPUSCULAR HEMOGLOBIN: 30.8 pg (ref 25.9–32.4)
MEAN CORPUSCULAR VOLUME: 93.3 fL (ref 77.6–95.7)
MEAN PLATELET VOLUME: 10.2 fL (ref 6.8–10.7)
MONOCYTES ABSOLUTE COUNT: 0.3 10*9/L (ref 0.3–0.8)
MONOCYTES RELATIVE PERCENT: 4.2 %
NEUTROPHILS ABSOLUTE COUNT: 6.3 10*9/L (ref 1.8–7.8)
NEUTROPHILS RELATIVE PERCENT: 78.7 %
PLATELET COUNT: 147 10*9/L — ABNORMAL LOW (ref 150–450)
RED BLOOD CELL COUNT: 3.89 10*12/L — ABNORMAL LOW (ref 3.95–5.13)
RED CELL DISTRIBUTION WIDTH: 15.9 % — ABNORMAL HIGH (ref 12.2–15.2)
WBC ADJUSTED: 7.9 10*9/L (ref 3.6–11.2)

## 2021-11-27 LAB — URINALYSIS WITH MICROSCOPY WITH CULTURE REFLEX
BACTERIA: NONE SEEN /HPF
BILIRUBIN UA: NEGATIVE
BLOOD UA: NEGATIVE
GLUCOSE UA: NEGATIVE
KETONES UA: NEGATIVE
LEUKOCYTE ESTERASE UA: NEGATIVE
NITRITE UA: NEGATIVE
PH UA: 5.5 (ref 5.0–9.0)
PROTEIN UA: NEGATIVE
RBC UA: <1 /HPF (ref <=4)
SPECIFIC GRAVITY UA: 1.009 (ref 1.003–1.030)
SQUAMOUS EPITHELIAL: 1 /HPF (ref 0–5)
UROBILINOGEN UA: <2
WBC UA: 1 /HPF (ref 0–5)

## 2021-11-27 LAB — ALBUMIN / CREATININE URINE RATIO
ALBUMIN QUANT URINE: 3.6 mg/dL
ALBUMIN/CREATININE RATIO: 54.2 ug/mg — ABNORMAL HIGH (ref 0.0–30.0)
CREATININE, URINE: 66.4 mg/dL

## 2021-11-27 LAB — HEPATIC FUNCTION PANEL
ALBUMIN: 3.8 g/dL (ref 3.4–5.0)
ALKALINE PHOSPHATASE: 110 U/L (ref 46–116)
ALT (SGPT): <7 U/L — ABNORMAL LOW (ref 10–49)
AST (SGOT): 11 U/L (ref <=34)
BILIRUBIN DIRECT: 0.1 mg/dL (ref 0.00–0.30)
BILIRUBIN TOTAL: 0.3 mg/dL (ref 0.3–1.2)
PROTEIN TOTAL: 6.7 g/dL (ref 5.7–8.2)

## 2021-11-27 LAB — HEMOGLOBIN A1C
ESTIMATED AVERAGE GLUCOSE: 105 mg/dL
HEMOGLOBIN A1C: 5.3 % (ref 4.8–5.6)

## 2021-11-27 LAB — PROTEIN / CREATININE RATIO, URINE
CREATININE, URINE: 68.8 mg/dL
PROTEIN URINE: 13.7 mg/dL
PROTEIN/CREAT RATIO, URINE: 0.199

## 2021-11-27 LAB — PHOSPHORUS: PHOSPHORUS: 4.6 mg/dL (ref 2.4–5.1)

## 2021-11-27 LAB — MAGNESIUM: MAGNESIUM: 1.3 mg/dL — ABNORMAL LOW (ref 1.6–2.6)

## 2021-11-27 LAB — PARATHYROID HORMONE (PTH): PARATHYROID HORMONE INTACT: 418.5 pg/mL — ABNORMAL HIGH (ref 18.4–80.1)

## 2021-11-27 LAB — GREEN LITHIUM HEPARIN EXTRA TUBE

## 2021-11-27 LAB — TACROLIMUS LEVEL, TROUGH: TACROLIMUS, TROUGH: 6.8 ng/mL (ref 5.0–15.0)

## 2021-11-27 MED ADMIN — heparin (porcine) 5,000 unit/mL injection 5,000 Units: 5000 [IU] | SUBCUTANEOUS | @ 21:00:00

## 2021-11-27 MED ADMIN — sodium chloride 0.9% (NS) bolus 1,000 mL: 1000 mL | INTRAVENOUS | @ 22:00:00 | Stop: 2021-11-27

## 2021-11-27 NOTE — Unmapped (Signed)
 [facility]  Emergency Department Provider Note      ED Clinical Impression      Final diagnoses:   None            Impression, Medical Decision Making, Progress Notes and Critical Care      Impression, Differential Diagnosis and Plan of Care    Pt is a 66 y.o. F status post deceased donor kidney transplant on 03/30/2019 (Kidney) for native kidney disease secondary to diabetic nephropathy who hpresents with AKI in setting of worsening diarrhea.  Tentative diagnosis includes dehydration, possible gastroenteritis versus an absorption versus secondary to antirejection medication versus colitis (less likely given lack of tenderness).  We will plan for labs, GI pathogen C. difficile, IV fluid hydration gently stable cardiac status, will need admission      Discussion of Management with other Physicians, QHP or Appropriate Source: Discussed with nephrology as well as admitting team.  Independent Interpretation of Studies: US  without abscess  External Records Reviewed: Patient's most recent discharge summary, Patient's most recent outpatient clinic note, and Patient's most recent outside Emergency Department visit  Escalation of Care, Consideration of Admission/Observation/Transfer: Patient with worsening renal function in the setting of a transplanted kidney and ongoing volume loss.  Will need fluid as well as admission.  Social determinants that significantly affected care: None applicable  Prescription drug(s) considered but not prescribed: None  Diagnostic tests considered but not performed: None  History obtained from other sources: Family    Additional Progress Notes    Patient has been seen by nephrology who agrees with admission.  They recommended an ultrasound.  Will discuss with inpatient team.    Portions of this record have been created using Dragon dictation software. Dictation errors have been sought, but Ivy Puryear not have been identified and corrected.    See chart and resident provider documentation for details.    ____________________________________________         History        Reason for Visit  Dehydration      HPI   Michele Cisneros is a 66 y.o. female with history of renal transplant back in 2015.  She states she has had intermittent dizziness and lightheadedness over the last several months.  Was seen in clinic by her nephrologist.  Was noted to have elevated creatinine off her baseline.  She does endorse that she has been having worsening diarrhea over the last month and is now worsening over the last several weeks.  Usually diarrhea is associated with the eating.  She has had decreased p.o. intake because of it.  She noticed multiple loose stools without any signs of bleeding.  No chest pain or shortness of breath.  No frank abdominal pain but notes that she becomes very nauseated and will vomit at times.  No dysuria or hematuria.  No other complaints.  Does have a significant history of heart failure and was referred in by nephrology given difficulty with rehydration in the setting of heart failure and new AKI.          Outside Historian(s)  Family    External Records Reviewed  Inpatient/Outpatient notes, Prior labs/imaging studies      Past Medical History:   Diagnosis Date    End stage renal disease (CMS-HCC) 11/08/2012    ESRD on hemodialysis (CMS-HCC) 08/21/2011    Pt gets dialysis with [facility] in [address], [place], on a TTS schedule.  She started dialysis in Feb 2014.  Etiology of renal failure  not known, likely diabetes.  Has a left upper arm AV graft as of Dec 2014.   On transplant list since 2015.  Dr. Marcelino RN [place] [phone]  Last Assessment & Plan:  Stable. continues to follow with lateef. On kidney transplant list.    Other and unspecified hyperlipidemia 11/08/2012    Type II or unspecified type diabetes mellitus 11/08/2012    Unspecified essential hypertension 11/08/2012       Patient Active Problem List   Diagnosis    Diabetes mellitus, type II (CMS-HCC)    Essential hypertension Hyperlipidemia    Status post deceased-donor kidney transplantation    Allergic rhinitis    Bilateral carotid artery stenosis    Anemia in chronic kidney disease (CKD)    Chronic back pain    Acute on chronic diastolic congestive heart failure (CMS-HCC)    COPD (chronic obstructive pulmonary disease) (CMS-HCC)    Coronary artery disease of native artery of native heart with stable angina pectoris (CMS-HCC)    Neuritis or radiculitis due to rupture of lumbar intervertebral disc    OSA on CPAP    Osteoarthritis of right hip    Mitral regurgitation    Diabetic retinopathy (CMS-HCC)    Diabetic neuropathy (CMS-HCC)    PAD (peripheral artery disease) (CMS-HCC)    Pain due to onychomycosis of toenails of both feet    Syncope    Thyroid nodule    Valvular heart disease    Immunocompromised patient (CMS-HCC)    GERD (gastroesophageal reflux disease)    Esophageal ulceration       Past Surgical History:   Procedure Laterality Date    AV FISTULA PLACEMENT      HYSTERECTOMY  2000    PR TRANSPLANTATION OF KIDNEY Left 03/30/2019    Procedure: RENAL ALLOTRANSPLANTATION, IMPLANTATION OF GRAFT; WITHOUT RECIPIENT NEPHRECTOMY;  Surgeon: Alexey Zendel, MD;  Location: MAIN OR [place];  Service: Transplant    PR UP GI ENDOSCOPY,DILATN W GUIDE N/A 07/05/2019    Procedure: UGI ENDOSCOPY; WITH INSERTION OF GUIDE WIRE FOLLOWED BY DILATION OF ESOPHAGUS OVER GUIDE WIRE;  Surgeon: Olam Earnie Henle, MD;  [facility]: [facility];  Service: Gastroenterology    PR UPPER GI ENDOSCOPY,BIOPSY N/A 05/04/2019    Procedure: UGI ENDOSCOPY; WITH BIOPSY, SINGLE OR MULTIPLE;  Surgeon: Burnard Almarie Budd, MD;  Location: GI [facility];  Service: Gastroenterology    TOTAL ABDOMINAL HYSTERECTOMY W/ BILATERAL SALPINGOOPHORECTOMY  2000       No current facility-administered medications for this encounter.    Current Outpatient Medications:     acetaminophen  (TYLENOL ) 500 MG tablet, Take 1-2 tablets (500-1,000 mg total) by mouth every six (6) hours as needed for pain or fever (> 38C or 100.33F)., Disp: 30 tablet, Rfl: 0    albuterol (PROVENTIL HFA;VENTOLIN HFA) 90 mcg/actuation inhaler, Inhale 2 puffs every six (6) hours as needed for wheezing., Disp: , Rfl:     amLODIPine  (NORVASC ) 10 MG tablet, Take 1 tablet (10 mg total) by mouth daily., Disp: 30 tablet, Rfl: 11    aspirin  (ECOTRIN) 81 MG tablet, Take 1 tablet (81 mg total) by mouth daily., Disp: , Rfl:     atorvastatin  (LIPITOR ) 20 MG tablet, Take 1 tablet (20 mg total) by mouth daily., Disp: 90 tablet, Rfl: 3    blood sugar diagnostic (CONTOUR TEST STRIPS) Strp, by Other route two (2) times a day. TEST BLOOD SUGARS 2 TIMES DAILY, Disp: 60 strip, Rfl: 5    blood-glucose meter  kit, Please dispense for patient to use at home to check blood sugars., Disp: , Rfl:     cloNIDine  HCL (CATAPRES ) 0.1 MG tablet, Take 2 tablets (0.2 mg total) by mouth Two (2) times a day., Disp: 120 tablet, Rfl: 11    docusate sodium  (COLACE) 100 MG capsule, Take 1 capsule (100 mg total) by mouth two (2) times a day as needed for constipation. (Patient not taking: Reported on 07/24/2021), Disp: 60 capsule, Rfl: 1    ergocalciferol -1,250 mcg, 50,000 unit, (VITAMIN D2-1,250 MCG, 50,000 UNIT,) 1,250 mcg (50,000 unit) capsule, Take 1 capsule (1,250 mcg total) by mouth once a week. Take for 8 weeks as directed., Disp: 4 capsule, Rfl: 2    esomeprazole  (NEXIUM ) 20 MG capsule, Take 1 capsule (20 mg total) by mouth daily., Disp: 90 capsule, Rfl: 3    ferrous sulfate  325 (65 FE) MG EC tablet, Take 1 tablet (325 mg total) by mouth every other day., Disp: 15 tablet, Rfl: 11    furosemide  (LASIX ) 40 MG tablet, Take 1 tablet (40 mg total) by mouth Two (2) times a day., Disp: 60 tablet, Rfl: 11    hydrALAZINE (APRESOLINE) 50 MG tablet, Take 50 mg by mouth Three (3) times a day as needed (high blood pressure). 1-3x/day as needed for BP > 140. (Patient not taking: Reported on 11/27/2021), Disp: , Rfl:     LANCETS,THIN MISC, Using at home to test blood sugars twice daily., Disp: , Rfl:     lidocaine  (LIDODERM ) 5 % patch, Place 1 patch on the skin daily. Apply to affected area for 12 hours only each day (then remove patch) (Patient not taking: Reported on 11/27/2021), Disp: 30 patch, Rfl: 0    metFORMIN  (GLUCOPHAGE ) 500 MG tablet, Take 1 tablet (500 mg total) by mouth in the morning and 1 tablet (500 mg total) in the evening. Take with meals., Disp: 180 tablet, Rfl: 3    mycophenolate  (MYFORTIC ) 180 MG EC tablet, Take 1 tablet (180 mg total) by mouth Two (2) times a day. (Patient not taking: Reported on 11/27/2021), Disp: 180 tablet, Rfl: 3    nitroglycerin (NITROSTAT) 0.4 MG SL tablet, Place 1 tablet (0.4 mg total) under the tongue daily as needed., Disp: , Rfl:     polyethylene glycol (MIRALAX ) 17 gram packet, Take 17 g by mouth Two (2) times a day. (Patient not taking: Reported on 07/03/2021), Disp: 60 packet, Rfl: 0    pregabalin  (LYRICA ) 75 MG capsule, Take 1 capsule (75 mg total) by mouth every morning AND 2 capsules (150 mg total) nightly., Disp: 90 capsule, Rfl: 3    sodium bicarbonate  650 mg tablet, Take 1 tablet (650 mg total) by mouth Two (2) times a day., Disp: 60 tablet, Rfl: 11    sodium zirconium cyclosilicate  (LOKELMA ) 10 gram PwPk packet, Take 1 packet (10 g total) by mouth daily as needed., Disp: 30 packet, Rfl: 5    tacrolimus  (ENVARSUS  XR) 1 mg Tb24 extended release tablet, Take 3 (1mg ) tablet with 2 (4mg ) tablets every morning to equal 11mg  daily dose, Disp: 270 tablet, Rfl: 3    tacrolimus  (ENVARSUS  XR) 4 mg Tb24 extended release tablet, Take 2 tablets (8 mg total) by mouth daily. Take 2 (4mg ) tablets with 3 (1mg ) tablet every morning to equal 11mg  daily dose, Disp: 180 tablet, Rfl: 3    Allergies  Hydrocodone    Family History   Problem Relation Age of Onset    Heart disease Mother  Diabetes Mother     Cancer Father     Colon cancer Father 19    Colon cancer Sister 48    Breast cancer Sister        Social History  Social History     Tobacco Use    Smoking status: Every Day     Packs/day: 0.25     Years: 30.00     Pack years: 7.50     Types: Cigarettes     Last attempt to quit: 05/09/2013     Years since quitting: 8.5    Smokeless tobacco: Never    Tobacco comments:     1 pack every week; she reports historical periods of abstinence   Vaping Use    Vaping Use: Former   Substance Use Topics    Alcohol use: Not Currently     Comment: very rare use (reports once over last year)    Drug use: No          Physical Exam     ED Triage Vitals [11/27/21 1123]   Enc Vitals Group      BP       Heart Rate 98      SpO2 Pulse       Resp       Temp       Temp src       SpO2 100 %      Weight       Height       Head Circumference       Peak Flow       Pain Score       Pain Loc       Pain Edu?       Excl. in GC?        Constitutional: Alert and oriented. Well appearing and in no distress.  Eyes: Conjunctivae are normal.  ENT       Head: Normocephalic and atraumatic.       Nose: No congestion.       Mouth/Throat: Mucous membranes are dry       Neck: No stridor.  Hematological/Lymphatic/Immunilogical: No cervical lymphadenopathy.  Cardiovascular: Normal rate, regular rhythm. Normal and symmetric distal pulses are present in all extremities.  Respiratory: Normal respiratory effort. Breath sounds are normal.  Gastrointestinal: Soft and nontender. There is no CVA tenderness.  Musculoskeletal: Normal range of motion in all extremities.       Right lower leg: No tenderness or edema.       Left lower leg: No tenderness or edema.  Neurologic: Normal speech and language. No gross focal neurologic deficits are appreciated.  Skin: Skin is warm, dry and intact. No rash noted.  Psychiatric: Mood and affect are normal. Speech and behavior are normal.       Radiology     US  Renal Transplant W Doppler   Final Result   No significant change compared with prior study. Stable, elevated resistive indices in the renal transplant arteries                     Caden Fatica Caro Colt, MD  12/01/21 9097

## 2021-11-27 NOTE — Unmapped (Signed)
Transplant Nephrology Clinic Visit    Assessment/Plan:  Michele Cisneros is a 66 y.o. female with DM, HTN, CAD, multivalvular heart disease, and COPD and is s/p deceased donor kidney transplant on 03/30/2019 for native kidney ESRD presumed secondary to diabetic nephropathy. She presents today with acute allograft dysfunction, worsening of chronic diarrhea, nausea, vomiting, generalized weakness, and dizziness upon standing. She will be sent to the ED from clinic for probable admission for monitored volume replacement. Issues addressed today include:      Acute on chronic diarrhea, nausea, vomiting with volume depletion  - Differential diagnosis includes viral gastroenteritis such as norovirus, CMV, COVID-19 or other infectious etiologies including UTI versus medication (metformin and mycophenolate) associated diarrhea.   - Would temporarily hold Myfortic and consider eventual change to azathioprine  - Check GI pathogen panel, CMV and EBV viral load  - Stop metformin   - Hold diuretics  - Admit for monitored volume replacement.    S/P deceased donor kidney transplant with acute allograft dysfunction   - Serum creatinine is 2.36 mg/dL with baseline range of 0.9-1.4 mg/dL. The AKI component is likely related to volume depletion and much less likely due to other potential possibilities like pyelonephritis, rejection, or obstruction.     - She has not history of rejection, proteinuria, or presence of DSAs.   - Will need renal US as part of admission evaluation      Immunosuppression Management   - Tacrolimus level is pending (target 4-7)    - Will continue Envarsus XR 11 mg daily while awaiting her level. Diarrhea is often associated with increased levels so she may need dose reduction.   - Will hold Myfortic. Due to GI effects this may end up being long-standing and alternative therapy such as azathioprine may need to be considered.     History of recurrent post-transplant UTI:   - Most recent UTI 06/25/21 grew klebsiella and E.coli and was treated with ceftriaxone followed by cefdinir for 7 days  - Other UTIs include proteus isolated on 11/28/19, 12/26/19,02/27/20 and 03/19/20 with citrobacter isolated 03/19/20  - Has had urinary retention in the past as a risk factor.  - UA and culture were not obtained prior to her going to ED today. This will need to be done in the ED.    Coronary artery disease, s/p RCA stent 2015  - Myocardial perfusion study 06/19/21 revealed no evidence of ischemia.  - Started on isosorbide by her cardiologist on 10/14/21 for chronic stable angina  - Continue statin, aspirin, prn SL NTG, isosorbide    Chronic CHF with diastolic dysfunction and valvular heart disease  - Hospitalized for acute on chronic CHF in January 2023  - Echocardiogram 06/27/21: mild to moderate MR/MS/AI/AS  - Volume repletion needs to be in the hospital given the risk of precipitating CHF with IVF.   - Will need to hold diuretics temporarily  - ACE inhibitor/ARB therapy not initiated due to hyperkalemia and recent AKI.  - May benefit from SGLT-2 inhibitor eventually  - She will follow up with her local Cardiologist, Dr.Gollan    Hypertension, with history of postural hypotension and syncope   - BP today in clinic 154/59. She is symptomatic upon standing today.    - The balance between symptomatic postural hypotension and recumbent or seated HTN has been challenging  - Will address anihypertensive regimen during hospitalization. She is currently on  amlodipine 10 mg daily, clonidine 0.1 mg bid, hydralazine 50 mg tid, furosemide 40 mg bid and  isosorbide.      History of hyperkalemia with history of metabolic acidosis  - K 4.3 today, serum CO2 23.9.    - Will continue sodium bicarbonate    Right breast nodule  - Noted on PET CT myocardial perfusion study 06/19/21  - Mammogram 08/05/21 benign findings    Anemia, Iron Deficiency  - Hemoglobin has improved with iron supplementation to 12.0 today though she is hemoconcentrated.     - Iron saturation 26%   - She has a history of ulcers that were healed on EGD 05/04/19 and 09/06/21  - Colonoscopy 09/06/21 with diverticulosis, internal hemorrhoids, and multiple polyps with pathology showing tubular adenomas. Prior colonoscopies also showed tubular adenomas.     History of CMV viremia  - Viral load negative 07/24/21   - CMV viral load from today is pending  - On no therapy currently    Esophageal stenosis, history of esophageal ulcers:   - S/P dilation 07/06/19 with resolution of ulcers on EGD   - She remains on Nexium    Pulmonary Nodules with History of Smoking Associated COPD:   - Chest CT 09/12/20 revealed stable pulmonary nodules compared to 05/02/19  - Will continue annual lung cancer screening low dose CT  - Chest CT was scheduled for today but was cancelled due to her acute illness and need to go to ED.    Lumbar Radiculopathy Secondary to L5-S1 disc disease Peripheral Neuropathy secondary to DM   - Following with [facility]  - Continue Lyrica.    Thyroid Nodule:   - [place] 02/01/20 revealed no nodules suggestive of cancer     History of DM, type II, controlled without current therapy:  - A1C 5.3 today  - SGLT-2 inhibitor may be indicated though some concern due to recurrent UTIs.  - She is unlikely to tolerate a GLP-1 receptor agonist due to GI concerns.    - Will stop metformin today    COVID-19 infection, confirmed 07/01/21  - Infection was presumed obtained in the hospital and not the cause for hospitalization.  - S/P treatment with remdesivir.  - Had received 5 doses of COVID vaccine including a bivalent booster prior to her illness.  - No complications or long-COVID symptoms.     Health Maintenance:   Breast Cancer Screening: Chest CT (09/12/20) with asymmetry of breast tissue and PET CT myocardial perfusion study 06/19/21 showed right breast 1.4 cm lesion. Mammogram 08/05/21 with benign findings .   Pap Smear: s/p hysterectomy  Colonoscopy: Has history of multiple tubular adenomas on multiple colonoscopies including several removed in March 2023. No high grade atypia noted.   Lung cancer screening: annual low dose chest CT, last study 09/12/20, now due but postponed today due to acute illness.  Renal US: native kidney imaging now due, postponed today due to acute illness    Immunizations/Infection Prevention:   - Flu: 04/11/21  - Prevnar 13: not noted in records.    - Pneumovax: 03/23/18  - COVID-19: S/P 5 doses including bivalent booster 05/06/21. She is aware of limited efficacy of the vaccine in immunosuppressed patients.      Follow-Up:   - Will see patient in follow up after hospitalization  - Cardiology follow up with Dr. Mariah Milling  - Needs screening renal US and chest CT rescheduled  - Endocrinology follow up     Interval History:   She was hospitalized at [facility] 06/15/21-06/28/21 with acute on chronic heart failure, anemia, UTI and COVID-19 infection. Her initial presenting  symptoms were progressively worsening dyspnea over a several week period and a 2 day history of chest pain. CXR revealed pulmonary edema and BNP was elevated at 894. Myocardial perfusion study revealed no evidence of ischemia with EF >60%. TEE revealed EF 60-65%, mild to moderate MR/MS/AR/AS. She was treated with IV furosemide and required a Foley catheter placement after developing urinary retention of 1277 ml of urine. Though she was initially COVID-19 negative on presentation COVID-19 was diagnosed on 06/21/21. She was treated with remdesivir. Serum creatinine peaked at 1.7 mg/dL and was 1.61 mg/dL on the day of discharge after furosemide induced diuresis. Urine culture grew klebsiella and E.coli and she was treated with ceftriaxone followed by cefdinir for a 7 day course. A right breast nodule was noted on her PET CT scan. She did not have a mammogram as an inpatient. Hemoglobin was 8.7 on admission and reached a nadir of 7.1 on 06/18/21. She was started on oral iron after iron saturation % was found to be 13%, She was discharged on 06/28/21 without a Foley after a successul trial of void and on furosemide 40 mg bid, lokelma 10 gm daily for 14 days, and an increase in the dose of amlodipine to 10 mg daily. Her Envarsus dose was increased to 11 mg daily based on inpatient levels.     She saw her cardiologist, Dr. Mariah Milling, in clinic on 10/14/21 and was started on isosorbide for chest pain felt to be due to chronic stable angina. Clonidine was decreased to 0.1 mg bid. She had an EGD and colonoscopy in 08/2021 revealing multiple tubular adenomaa, diverticulosis, and internal hemorrhoids. She had mammogram with benign findings on 08/05/21.     She presents today with complaints of nausea, vomiting, diarrhea, weakness, and dizziness upon standing. Though diarrhea and dizziness are chronic problems they have recently worsened. PO intake has been poor. Her gastroenterologist started her on Linzess, but she only took 1 dose due to GI intolerance. She takes imodium daily. She has not been checking blood glucoses. She as been taking metformin and furosemide. She denies dysuria, allograft tenderness, fever, chills, headaches, chest pain, or shortness of breath. She denies recent falls. BP has been 140's/50's at home. She continues to have low back pain and hip pain and uses marijuana for control of pain. She took her last dose of Envarsus at 9 AM yesterday      Transplant History:    Native Kidney Disease: Diabetic nephropathy   Pre-transplant PRA: 70%   Organ Received: DDKT, DBD, KDPI: 70%  CMV/EBV Status: CMV D-/R+  EBV D+/R+  Resp culture: + strep pneumo  Date of Transplant: 03/30/2019  Implantation biopsy: mild ATN, otherwise normal.  Post-transplant complication - DGF  Induction: Campath with rapid steroid discontinuation  Maintenance Immunosuppression on discharge: Tacrolimus/Myfotic  Date of Ureteral Stent Removal: 06/13/2019  Current Immunosuppression: Tacrolimus monotherapy    Other Past Medical History  1. Diabetes mellitus, type 2, diagnosed 2012.   2. Diabetic peripheral neuropathy  3. Diabetic retinopathy, s/p laser surgery, s/p retinal detachment  4. Hypertension, diagnosed 2012.   5. Mixed hyperlipidemia.   6. Coronary artery disease, s/p NSTEMI 05/2013, PCI RCA, 20% KNm 80% PDA 1. Cardiac cath 05/02/15 LAD 20% and 30% lesions, Cx 35%, RPDA 70%, patent right coronary stent  7. HFpEF, echocardiogram 06/17/21 with EF 60-65%, mild to moderate MR, MS, aortic regurgitation, and aortic stenosis with mild pulmonary HTN  8. COPD, emphysema secondary to smoking  9. Peripheral artery disease  10. Carotid  artery stenosis 1-39% bilateral stenosis 12/2017  10. Sickle Cell Trait  11. S/P TAH/BSO 2000  12. Lumbar radiculopathy secondary to L5-S1 disc bulge   13. History of thyroid nodule  14. Colonic polyps removed 08/10/17, pathology tubular adenomas,  follow up in 3 years  15. Right breast soft tissue mass noted 07/07/17 stable in appearance compared to past imaging   16. Pulmonary nodule noted 07/07/17, 11.23/20, and 09/12/20 similar in size to lesion as far back as 2015.   17. OSA on CPAP    Review of Systems    Otherwise as per HPI, all other systems reviewed and are negative.    Medications  Current Outpatient Medications   Medication Sig Dispense Refill    acetaminophen (TYLENOL) 500 MG tablet Take 1-2 tablets (500-1,000 mg total) by mouth every six (6) hours as needed for pain or fever (> 38C or 100.2F). 30 tablet 0    albuterol (PROVENTIL HFA;VENTOLIN HFA) 90 mcg/actuation inhaler Inhale 2 puffs every six (6) hours as needed for wheezing.      amLODIPine (NORVASC) 10 MG tablet Take 1 tablet (10 mg total) by mouth daily. 30 tablet 11    aspirin (ECOTRIN) 81 MG tablet Take 81 mg by mouth daily.      atorvastatin (LIPITOR) 20 MG tablet Take 1 tablet (20 mg total) by mouth daily. 90 tablet 3    blood sugar diagnostic (CONTOUR TEST STRIPS) Strp by Other route two (2) times a day. TEST BLOOD SUGARS 2 TIMES DAILY 60 strip 5    blood-glucose meter kit Please dispense for patient to use at home to check blood sugars.      cloNIDine HCL (CATAPRES) 0.1 MG tablet Take 2 tablets (0.2 mg total) by mouth Two (2) times a day. 120 tablet 11    docusate sodium (COLACE) 100 MG capsule Take 1 capsule (100 mg total) by mouth two (2) times a day as needed for constipation. (Patient not taking: Reported on 07/24/2021) 60 capsule 1    ergocalciferol-1,250 mcg, 50,000 unit, (VITAMIN D2-1,250 MCG, 50,000 UNIT,) 1,250 mcg (50,000 unit) capsule Take 1 capsule (1,250 mcg total) by mouth once a week. Take for 8 weeks as directed. 4 capsule 2    esomeprazole (NEXIUM) 20 MG capsule Take 1 capsule (20 mg total) by mouth daily. 90 capsule 3    ferrous sulfate 325 (65 FE) MG EC tablet Take 1 tablet (325 mg total) by mouth every other day. 15 tablet 11    furosemide (LASIX) 40 MG tablet Take 1 tablet (40 mg total) by mouth Two (2) times a day. 60 tablet 11    hydrALAZINE (APRESOLINE) 50 MG tablet Take 50 mg by mouth Three (3) times a day as needed (high blood pressure). 1-3x/day as needed for BP > 140.      LANCETS,THIN MISC Using at home to test blood sugars twice daily.      lidocaine (LIDODERM) 5 % patch Place 1 patch on the skin daily. Apply to affected area for 12 hours only each day (then remove patch) 30 patch 0    metFORMIN (GLUCOPHAGE) 500 MG tablet Take 1 tablet (500 mg total) by mouth in the morning and 1 tablet (500 mg total) in the evening. Take with meals. 180 tablet 3    mycophenolate (MYFORTIC) 180 MG EC tablet Take 1 tablet (180 mg total) by mouth Two (2) times a day. 180 tablet 3    nitroglycerin (NITROSTAT) 0.4 MG SL tablet Place 0.4 mg under the  tongue daily as needed.      polyethylene glycol (MIRALAX) 17 gram packet Take 17 g by mouth Two (2) times a day. (Patient not taking: Reported on 07/03/2021) 60 packet 0    pregabalin (LYRICA) 75 MG capsule Take 1 capsule (75 mg total) by mouth every morning AND 2 capsules (150 mg total) nightly. 90 capsule 3    sodium bicarbonate 650 mg tablet Take 1 tablet (650 mg total) by mouth Two (2) times a day. 60 tablet 11    sodium zirconium cyclosilicate (LOKELMA) 10 gram PwPk packet Take 1 packet (10 g total) by mouth daily as needed. (Patient not taking: Reported on 07/24/2021) 30 packet 5    tacrolimus (ENVARSUS XR) 1 mg Tb24 extended release tablet Take 3 (1mg ) tablet with 2 (4mg ) tablets every morning to equal 11mg  daily dose 270 tablet 3    tacrolimus (ENVARSUS XR) 4 mg Tb24 extended release tablet Take 2 tablets (8 mg total) by mouth daily. Take 2 (4mg ) tablets with 3 (1mg ) tablet every morning to equal 11mg  daily dose 180 tablet 3     No current facility-administered medications for this visit.           Physical Exam  BP 154/59 (BP Site: L Arm, BP Position: Sitting, BP Cuff Size: Small)  - Pulse 73  - Temp 37.1 ??C (98.7 ??F) (Temporal)  - Ht 170.2 cm (5' 7)  - Wt 65.7 kg (144 lb 14.4 oz)  - BMI 22.69 kg/m??   General: Patient is a pleasant female in no apparent distress.  Eyes: Sclera anicteric.  Neck: Supple without LAD/JVD/bruits.  Lungs: Clear to auscultation bilaterally, no wheezes/rales/rhonchi.  Cardiovascular:  grade 2/6 systolic murmur .  Abdomen: Soft, notender/nondistended. Positive bowel sounds. No hepatosplenomegaly, masses or bruits appreciated.  Extremities: no edema  Skin: Without rash  Neurological:  ataxic gait, reduced strength in legs, symmetric .  Psychiatric: Mood and affect appropriate.    Laboratory Data and Imaging Reviewed

## 2021-11-27 NOTE — Unmapped (Signed)
Transplant Nephrology Consult     Requesting provider: Debarah Crape  Service requesting consult: ED  Reason for consult: AKI/transplant kidney/immunosuppression      Assessment/Recommendations: Michele Cisneros is a 66 y.o. female with DM, HTN, CAD, multivalvular heart disease, chronic diarrhea, COPD and is status post deceased donor kidney transplant on 03/30/2019 (Kidney) for native kidney disease secondary to diabetic nephropathy who has been presented with AKI in setting of worsening diarrhea.    # AKI in transplanted kidney  # Kidney allograft function: (worsened)  - Serum creatinine level is 2.36. Baseline is 0.9-1.4 mg/dL  - The AKI component is likely related to volume depletion and much less likely due to other potential possibilities like pyelonephritis, rejection, or obstruction.     - She has not history of rejection, proteinuria, or presence of DSAs.   - Will need transplant renal US as part of admission evaluation   - Give 1 L in ED over 4 hours    Acute on chronic diarrhea, nausea, vomiting with volume depletion  - Differential diagnosis includes viral gastroenteritis such as norovirus, CMV, COVID-19 or other infectious etiologies including UTI versus medication (metformin and mycophenolate) associated diarrhea.   - Would temporarily hold Myfortic and we will consider eventual change to azathioprine  - Check GI pathogen panel, CMV and EBV viral load  - Stop metformin   - Hold diuretics  - Admit for monitored volume replacement.     Immunosuppression Management   - Tacrolimus level is pending (target 4-7)    - Continue Envarsus XR 11 mg daily while awaiting her level. Diarrhea is often associated with increased levels so she may need dose reduction.   - Will hold Myfortic. Due to GI effects this may end up being long-standing and alternative therapy such as azathioprine may need to be considered.    History of recurrent post-transplant UTI:   - Most recent UTI 06/25/21 grew klebsiella and E.coli and was treated with ceftriaxone followed by cefdinir for 7 days  - Other UTIs include proteus isolated on 11/28/19, 12/26/19,02/27/20 and 03/19/20 with citrobacter isolated 03/19/20  - Has had urinary retention in the past as a risk factor.  - Please obtain UA and urine culture    Chronic CHF with diastolic dysfunction and valvular heart disease  - Hospitalized for acute on chronic CHF in January 2023  - Echocardiogram 06/27/21: mild to moderate MR/MS/AI/AS  - Volume repletion needs to be in the hospital given the risk of precipitating CHF with IVF.   - Will need to hold diuretics temporarily  - ACE inhibitor/ARB therapy not initiated due to hyperkalemia and recent AKI.  - May benefit from SGLT-2 inhibitor eventually  - She will follow up with her local Cardiologist, Dr.Gollan     Hypertension, with history of postural hypotension and syncope   - BP today in clinic 154/59. She was symptomatic upon standing today.    - The balance between symptomatic postural hypotension and recumbent or seated HTN has been challenging  - Will address anihypertensive regimen during this hospitalization. For now can continue current regimen of amlodipine 10 mg daily, clonidine 0.1 mg bid, hydralazine 50 mg tid, isosorbide, if gets hypotensive can hold hydralazine. Hold Lasix 40 mg BID    History of CMV viremia  - Viral load negative 07/24/21   - CMV viral load from today is pending  - On no therapy currently      **Transplant patients with an open wound require wound care with sterile water  only. The patient should be counseled on this at the time of discharge if they have not already been doing this.    [name]  Division of Nephrology and Hypertension  [facility]  11/27/2021  12:15 PM      _____________________________________________________________________________________        Transplant Background  Native Kidney Disease: Diabetic nephropathy   Pre-transplant PRA: 70%   Organ Received: DDKT, DBD, KDPI: 70%  CMV/EBV Status: CMV D-/R+ EBV D+/R+  Resp culture: + strep pneumo  Date of Transplant: 03/30/2019  Implantation biopsy: mild ATN, otherwise normal.  Post-transplant complication - DGF  Induction: Campath with rapid steroid discontinuation  Maintenance Immunosuppression on discharge: Tacrolimus/Myfotic  Date of Ureteral Stent Removal: 06/13/2019  Current Immunosuppression: Tacrolimus monotherapy      History of Present Illness: Michele Cisneros is a/an 66 y.o. female  status post deceased donor kidney transplant for native kidney disease secondary to diabetic nephropathy who has presented to [facility] with AKI and worsening diarrhea.    She presents today after seen in nephrology clinic with complaints of nausea, vomiting, diarrhea, weakness, and dizziness upon standing. Though diarrhea and dizziness are chronic problems (normally 3-4 episodes per day) they have recently worsened (8-10 episodes per day) for past 2 weeks. PO intake has been poor. Her gastroenterologist started her on Linzess, but she only took 1 dose due to GI intolerance. She takes imodium daily. She has not been checking blood glucoses. She as been taking metformin and furosemide. She denies dysuria, allograft tenderness, fever, chills, headaches, chest pain, or shortness of breath. She denies recent falls. BP has been 140's/50's at home. She continues to have low back pain and hip pain and uses marijuana for control of pain. She took her last dose of Envarsus at 9 AM yesterday       Medications:   Current Facility-Administered Medications   Medication Dose Route Frequency Provider Last Rate Last Admin    sodium chloride 0.9% (NS) bolus 1,000 mL  1,000 mL Intravenous Once May Faythe Ghee, MD         Current Outpatient Medications   Medication Sig Dispense Refill    acetaminophen (TYLENOL) 500 MG tablet Take 1-2 tablets (500-1,000 mg total) by mouth every six (6) hours as needed for pain or fever (> 38C or 100.6F). 30 tablet 0    albuterol (PROVENTIL HFA;VENTOLIN HFA) 90 mcg/actuation inhaler Inhale 2 puffs every six (6) hours as needed for wheezing.      amLODIPine (NORVASC) 10 MG tablet Take 1 tablet (10 mg total) by mouth daily. 30 tablet 11    aspirin (ECOTRIN) 81 MG tablet Take 1 tablet (81 mg total) by mouth daily.      atorvastatin (LIPITOR) 20 MG tablet Take 1 tablet (20 mg total) by mouth daily. 90 tablet 3    blood sugar diagnostic (CONTOUR TEST STRIPS) Strp by Other route two (2) times a day. TEST BLOOD SUGARS 2 TIMES DAILY 60 strip 5    blood-glucose meter kit Please dispense for patient to use at home to check blood sugars.      cloNIDine HCL (CATAPRES) 0.1 MG tablet Take 2 tablets (0.2 mg total) by mouth Two (2) times a day. 120 tablet 11    docusate sodium (COLACE) 100 MG capsule Take 1 capsule (100 mg total) by mouth two (2) times a day as needed for constipation. (Patient not taking: Reported on 07/24/2021) 60 capsule 1    ergocalciferol-1,250 mcg, 50,000 unit, (VITAMIN D2-1,250  MCG, 50,000 UNIT,) 1,250 mcg (50,000 unit) capsule Take 1 capsule (1,250 mcg total) by mouth once a week. Take for 8 weeks as directed. 4 capsule 2    esomeprazole (NEXIUM) 20 MG capsule Take 1 capsule (20 mg total) by mouth daily. 90 capsule 3    ferrous sulfate 325 (65 FE) MG EC tablet Take 1 tablet (325 mg total) by mouth every other day. 15 tablet 11    furosemide (LASIX) 40 MG tablet Take 1 tablet (40 mg total) by mouth Two (2) times a day. 60 tablet 11    hydrALAZINE (APRESOLINE) 50 MG tablet Take 50 mg by mouth Three (3) times a day as needed (high blood pressure). 1-3x/day as needed for BP > 140. (Patient not taking: Reported on 11/27/2021)      LANCETS,THIN MISC Using at home to test blood sugars twice daily.      lidocaine (LIDODERM) 5 % patch Place 1 patch on the skin daily. Apply to affected area for 12 hours only each day (then remove patch) (Patient not taking: Reported on 11/27/2021) 30 patch 0    metFORMIN (GLUCOPHAGE) 500 MG tablet Take 1 tablet (500 mg total) by mouth in the morning and 1 tablet (500 mg total) in the evening. Take with meals. 180 tablet 3    mycophenolate (MYFORTIC) 180 MG EC tablet Take 1 tablet (180 mg total) by mouth Two (2) times a day. (Patient not taking: Reported on 11/27/2021) 180 tablet 3    nitroglycerin (NITROSTAT) 0.4 MG SL tablet Place 1 tablet (0.4 mg total) under the tongue daily as needed.      polyethylene glycol (MIRALAX) 17 gram packet Take 17 g by mouth Two (2) times a day. (Patient not taking: Reported on 07/03/2021) 60 packet 0    pregabalin (LYRICA) 75 MG capsule Take 1 capsule (75 mg total) by mouth every morning AND 2 capsules (150 mg total) nightly. 90 capsule 3    sodium bicarbonate 650 mg tablet Take 1 tablet (650 mg total) by mouth Two (2) times a day. 60 tablet 11    sodium zirconium cyclosilicate (LOKELMA) 10 gram PwPk packet Take 1 packet (10 g total) by mouth daily as needed. 30 packet 5    tacrolimus (ENVARSUS XR) 1 mg Tb24 extended release tablet Take 3 (1mg ) tablet with 2 (4mg ) tablets every morning to equal 11mg  daily dose 270 tablet 3    tacrolimus (ENVARSUS XR) 4 mg Tb24 extended release tablet Take 2 tablets (8 mg total) by mouth daily. Take 2 (4mg ) tablets with 3 (1mg ) tablet every morning to equal 11mg  daily dose 180 tablet 3        ALLERGIES  Hydrocodone    MEDICAL HISTORY  Past Medical History:   Diagnosis Date    End stage renal disease (CMS-HCC) 11/08/2012    ESRD on hemodialysis (CMS-HCC) 08/21/2011    Pt gets dialysis with [facility] in [address], [place], on a TTS schedule.  She started dialysis in Feb 2014.  Etiology of renal failure not known, likely diabetes.  Has a left upper arm AV graft as of Dec 2014.   On transplant list since 2015.  Dr. Cherylann Ratel RN Annabelle Harman [phone]  Last Assessment & Plan:  Stable. continues to follow with lateef. On kidney transplant list.    Other and unspecified hyperlipidemia 11/08/2012    Type II or unspecified type diabetes mellitus 11/08/2012    Unspecified essential hypertension 11/08/2012        SOCIAL HISTORY  Social History     Socioeconomic History    Marital status: Widowed   Tobacco Use    Smoking status: Every Day     Packs/day: 0.25     Years: 30.00     Pack years: 7.50     Types: Cigarettes     Last attempt to quit: 05/09/2013     Years since quitting: 8.5    Smokeless tobacco: Never    Tobacco comments:     1 pack every week; she reports historical periods of abstinence   Vaping Use    Vaping Use: Former   Substance and Sexual Activity    Alcohol use: Not Currently     Comment: very rare use (reports once over last year)    Drug use: No     Social Determinants of Psychologist, prison and probation services Strain: Low Risk     Difficulty of Paying Living Expenses: Not very hard   Food Insecurity: No Food Insecurity    Worried About Programme researcher, broadcasting/film/video in the Last Year: Never true    Ran Out of Food in the Last Year: Never true   Transportation Needs: No Transportation Needs    Lack of Transportation (Medical): No    Lack of Transportation (Non-Medical): No        FAMILY HISTORY  Family History   Problem Relation Age of Onset    Heart disease Mother     Diabetes Mother     Cancer Father     Colon cancer Father 31    Colon cancer Sister 39    Breast cancer Sister           Review of Systems:  A 12 system review of systems was negative except as noted in HPI.  Otherwise as per HPI, all other systems reviewed and negative    Physical Exam:  Vitals:    11/27/21 1123   Pulse: 98   SpO2: 100%     No intake/output data recorded.  No intake or output data in the 24 hours ending 11/27/21 1215    General: well-appearing, no acute distress  HEENT: anicteric sclera, oropharynx clear without lesions  CV: regular rate, normal rhythm, no murmurs, no gallops, no rubs, no peripheral edema  Lungs: clear to auscultation bilaterally, normal work of breathing  Abdomen: soft, non-tender, non-distended, graft site nontender  Skin: no visible lesions or rashes  Psych: alert, engaged, appropriate mood and affect  Musculoskeletal: no obvious deformities  Neuro: normal speech, no gross focal deficits     Test Results  Reviewed  Lab Results   Component Value Date    NA 134 (L) 11/27/2021    K 4.3 11/27/2021    CL 101 11/27/2021    CO2 23.9 11/27/2021    BUN 34 (H) 11/27/2021    CREATININE 2.36 (H) 11/27/2021    GLU 127 (H) 11/27/2021    CALCIUM 10.0 11/27/2021    ALBUMIN 3.8 11/27/2021    PHOS 4.6 11/27/2021           I have reviewed all relevant outside healthcare records related to the patient's kidney injury.

## 2021-11-27 NOTE — Unmapped (Signed)
Saw patient in clinic, reports she is doing well.    Reports hair loss but does not want to take any supplements    + lower back and bilateral hip pain R>L    +diarrhea as the day goes on. Has diarrhea all evening until she takes imodium. Had colonscopy and needs another in 3 years.     +every other day throwing up with diarrhea    +dizziness, ,no falls    +SOB with exertion. Saw cardiology in May. Started isosorbide 30mg  daily    +tremors (same)    +numbness/tingling in right leg    Smoking 7 cigarettes a day, encouraged her to cut back.    Labs today, took envarsus at 9am.    Drinking about 3 bottles of water and 1 pepsi a day    Appetite poor: does not like ensure drinks, trying to eat more protein    Sleeping. Not well, naps during the day    BP at home 140's/56    BS not checking      Denies ha, CP, heart palpitations, abdominal pain, urinary problems, swelling, or fevers

## 2021-11-27 NOTE — Unmapped (Signed)
Tacrolimus Therapeutic Monitoring Pharmacy Note    Kenslie Abbruzzese is a 66 y.o. female continuing tacrolimus.     Indication: Kidney transplant     Date of Transplant:  2020       Prior Dosing Information: Home regimen Envarsus 11 mg daily      Goals:  Therapeutic Drug Levels  Tacrolimus trough goal:  4-7 ng/mL    Additional Clinical Monitoring/Outcomes  Monitor renal function (SCr and urine output) and liver function (LFTs)  Monitor for signs/symptoms of adverse events (e.g., hyperglycemia, hyperkalemia, hypomagnesemia, hypertension, headache, tremor)    Results:   Tacrolimus level:  6.8 ng/mL, drawn appropriately    Pharmacokinetic Considerations and Significant Drug Interactions:  Concurrent hepatotoxic medications: None identified  Concurrent CYP3A4 substrates/inhibitors: None identified  Concurrent nephrotoxic medications: None identified    Assessment/Plan:  Recommendedation(s)  Continue current regimen of Envarsus 11 mg daily    Follow-up  Next level has been ordered on 06/23 at 0600 .   A pharmacist will continue to monitor and recommend levels as appropriate    Please page service pharmacist with questions/clarifications.    Kennis Carina, PharmD

## 2021-11-27 NOTE — Unmapped (Cosign Needed)
Internal Medicine (MEDU) History & Physical    Assessment & Plan:   Michele Cisneros is a 66 y.o. female whose presentation is complicated by DM, HTN, CAD, multivalvular heart disease, chronic diarrhea, COPD and is status post deceased donor kidney transplant on 03/30/2019 (Kidney) for native kidney disease secondary to diabetic nephropathy who hpresents with AKI in setting of worsening diarrhea.     Principal Problem:    AKI (acute kidney injury) (CMS-HCC)  Active Problems:    Status post deceased-donor kidney transplantation    Diabetes mellitus, type II (CMS-HCC)    Essential hypertension    Hyperlipidemia    Anemia in chronic kidney disease (CKD)    COPD (chronic obstructive pulmonary disease) (CMS-HCC)    Coronary artery disease of native artery of native heart with stable angina pectoris (CMS-HCC)    Diabetic neuropathy (CMS-HCC)    PAD (peripheral artery disease) (CMS-HCC)    Valvular heart disease    Immunocompromised patient (CMS-HCC)    GERD (gastroesophageal reflux disease)  Resolved Problems:    * No resolved hospital problems. *      Acute Kidney Injury  S/p DD Kidney Transplant on Immunosuppression (2020)  Kidney disease 2/2 diabetic neprhopathy. Presents with AKI with cr 2.36 from baseline 0.9 to 1.4. This is in the setting of diarrhea. No h/o rejection but does have h/o recurrent UTIs post transplant. No current UTI symptoms. Suspect pre-renal in setting of diarrhea, but will evaluate for other causes. She appears euvolemic to volume down on exam.   - 1L NS over 4 hours  - UA, Ucx  - Hold antiobiotics pending UA  - Urine protein/creatinine  - Renal Transplant [place] ordered  - hold home myfortic    - continue home tacrolimus 11 mg daily  - tac trough pending  - Daily BMP, Mg, Phos    Diarrhea - Nausea   1 month of diarrhea in setting of myfortic use. Interestingly, patient denies being on myfortic but it is consistently filled by her pharmacy. CMV, EBV, other GI infection on differential given immunosuppression.   - hold myfortic  - myfortic level  - follow up EBV, CMV, BK  pcrs  - follow-up GI path panel  - follow-up tacrolimus levels as above    Chronic Problems  HFpEF - Valvular Heart Disease: Last echo in 06/2021 with mild/mod MR/MS/AI/AoS. Mild PH. Appears euvolemic, giving fluids as above. Holding home lasix while giving fluids, restart prn.   DM: Hold home metformin, SSI. HbA1C is 5.3%.  HTN: Systolic BP 120-150s in ED. Resume home amlodipine, clonidine, imdur.   CAD: Continue home aspirin, statin.    Neuropathy: Continue home pregabalin    The patient's presentation is complicated by the following clinically significant conditions requiring additional evaluation and treatment or having a significant effect of this patient's care: - Volume depletion POA requiring further investigation, treatment, or monitoring     Checklist:  Diet: Regular Diet  DVT PPx: Heparin 5000units q8h  Code Status: DNR and DNI  Dispo: Patient appropriate for Observation based on expectation of ongoing need for hospitalization less than two midnights and/or low intensity of services provided    Chief Concern:   AKI (acute kidney injury) (CMS-HCC)      Subjective:   Michele Cisneros is a 66 y.o. female with presentation complicated by the conditions as above who was referred to the ED from Nephrology Clinic for AKI.     History obtained by Cherrie Gauze PGY1 from the patient.  HPI:    Patient presented today from her transplant nephrologist's office after her creatinine was found to be elevated from baseline in clinic.  Symptomatically, she reports that she has been feeling dizzy over the last several months and symptoms feels unstable.  She is also been having diarrhea for the last month.  This usually occurs after she eats.  She has loose stools 3-4 times a day without blood.  When she has a bowel movement she also feels nauseous and vomits gastric contents sometimes.  She denies any abdominal pain.  She has been taking her tacrolimus but think she was taken off Myfortic.  She also recently started Imdur in May 2023.  She denies any urinary symptoms.  She has been making a normal amount of urine.  He has been taking all of her other medications.       Designated Healthcare Decision Maker:  [name] currently has decisional capacity for healthcare decision-making and is able to designate a surrogate healthcare decision maker. Ms. Sindt designated healthcare decision maker(s) is/are Michele Cisneros  (the patient's adult sibling) as denoted by stated patient preference.    Objective:   Physical Exam:  Temp:  [36.7 ??C (98.1 ??F)-37.1 ??C (98.7 ??F)] 36.7 ??C (98.1 ??F)  Heart Rate:  [73-98] 98  Resp:  [16] 16  BP: (122-154)/(47-59) 122/47  SpO2:  [99 %-100 %] 99 %    Gen: NAD, converses   Eyes: Sclera anicteric, EOMI grossly normal   HENT: atraumatic, normocephalic  Neck: trachea midline  Heart: RRR  Lungs: CTAB, no crackles or wheezes  Abdomen: soft, NTND  Extremities: no edema  Neuro: grossly symmetric   Skin:  No rashes, lesions on clothed exam  Psych: Alert, oriented

## 2021-11-27 NOTE — Unmapped (Signed)
Patient sent here for severe dehydration

## 2021-11-28 LAB — CBC
HEMATOCRIT: 32.8 % — ABNORMAL LOW (ref 34.0–44.0)
HEMOGLOBIN: 11.2 g/dL — ABNORMAL LOW (ref 11.3–14.9)
MEAN CORPUSCULAR HEMOGLOBIN CONC: 34.2 g/dL (ref 32.0–36.0)
MEAN CORPUSCULAR HEMOGLOBIN: 31.1 pg (ref 25.9–32.4)
MEAN CORPUSCULAR VOLUME: 91.1 fL (ref 77.6–95.7)
MEAN PLATELET VOLUME: 10.8 fL — ABNORMAL HIGH (ref 6.8–10.7)
PLATELET COUNT: 126 10*9/L — ABNORMAL LOW (ref 150–450)
RED BLOOD CELL COUNT: 3.6 10*12/L — ABNORMAL LOW (ref 3.95–5.13)
RED CELL DISTRIBUTION WIDTH: 15.8 % — ABNORMAL HIGH (ref 12.2–15.2)
WBC ADJUSTED: 4.3 10*9/L (ref 3.6–11.2)

## 2021-11-28 LAB — CMV DNA, QUANTITATIVE, PCR: CMV VIRAL LD: NOT DETECTED

## 2021-11-28 LAB — RENAL FUNCTION PANEL
ALBUMIN: 3.5 g/dL (ref 3.4–5.0)
ANION GAP: 8 mmol/L (ref 5–14)
BLOOD UREA NITROGEN: 24 mg/dL — ABNORMAL HIGH (ref 9–23)
BUN / CREAT RATIO: 13
CALCIUM: 9.5 mg/dL (ref 8.7–10.4)
CHLORIDE: 106 mmol/L (ref 98–107)
CO2: 23 mmol/L (ref 20.0–31.0)
CREATININE: 1.79 mg/dL — ABNORMAL HIGH
EGFR CKD-EPI (2021) FEMALE: 31 mL/min/{1.73_m2} — ABNORMAL LOW (ref >=60)
GLUCOSE RANDOM: 202 mg/dL — ABNORMAL HIGH (ref 70–179)
PHOSPHORUS: 3.6 mg/dL (ref 2.4–5.1)
POTASSIUM: 4.6 mmol/L (ref 3.4–4.8)
SODIUM: 137 mmol/L (ref 135–145)

## 2021-11-28 LAB — TACROLIMUS LEVEL, TIMED: TACROLIMUS BLOOD: 6.7 ng/mL

## 2021-11-28 LAB — MAGNESIUM: MAGNESIUM: 1.4 mg/dL — ABNORMAL LOW (ref 1.6–2.6)

## 2021-11-28 MED ADMIN — tacrolimus (ENVARSUS XR) extended release tablet 11 mg: 11 mg | ORAL | @ 17:00:00 | Stop: 2021-11-28

## 2021-11-28 MED ADMIN — isosorbide mononitrate (IMDUR) 24 hr tablet 30 mg: 30 mg | ORAL | @ 14:00:00 | Stop: 2021-11-28

## 2021-11-28 MED ADMIN — cloNIDine HCL (CATAPRES) tablet 0.2 mg: .2 mg | ORAL | @ 14:00:00 | Stop: 2021-11-28

## 2021-11-28 MED ADMIN — sodium chloride 0.9% (NS) bolus 500 mL: 500 mL | INTRAVENOUS | @ 17:00:00 | Stop: 2021-11-28

## 2021-11-28 MED ADMIN — heparin (porcine) 5,000 unit/mL injection 5,000 Units: 5000 [IU] | SUBCUTANEOUS | @ 10:00:00 | Stop: 2021-11-28

## 2021-11-28 MED ADMIN — amLODIPine (NORVASC) tablet 10 mg: 10 mg | ORAL | @ 14:00:00 | Stop: 2021-11-28

## 2021-11-28 MED ADMIN — acetaminophen (TYLENOL) tablet 650 mg: 650 mg | ORAL | @ 17:00:00

## 2021-11-28 MED ADMIN — heparin (porcine) 5,000 unit/mL injection 5,000 Units: 5000 [IU] | SUBCUTANEOUS | @ 02:00:00

## 2021-11-28 MED ADMIN — pantoprazole (PROTONIX) EC tablet 20 mg: 20 mg | ORAL | @ 14:00:00 | Stop: 2021-11-28

## 2021-11-28 MED ADMIN — atorvastatin (LIPITOR) tablet 20 mg: 20 mg | ORAL | @ 14:00:00 | Stop: 2021-11-28

## 2021-11-28 MED ADMIN — sodium bicarbonate tablet 650 mg: 650 mg | ORAL | @ 14:00:00 | Stop: 2021-11-28

## 2021-11-28 MED ADMIN — sodium bicarbonate tablet 650 mg: 650 mg | ORAL | @ 02:00:00

## 2021-11-28 MED ADMIN — pregabalin (LYRICA) capsule 150 mg: 150 mg | ORAL | @ 02:00:00

## 2021-11-28 MED ADMIN — aspirin chewable tablet 81 mg: 81 mg | ORAL | @ 14:00:00 | Stop: 2021-11-28

## 2021-11-28 MED ADMIN — cloNIDine HCL (CATAPRES) tablet 0.2 mg: .2 mg | ORAL | @ 02:00:00

## 2021-11-28 MED ADMIN — pregabalin (LYRICA) capsule 75 mg: 75 mg | ORAL | @ 14:00:00 | Stop: 2021-11-28

## 2021-11-28 MED ADMIN — melatonin tablet 3 mg: 3 mg | ORAL | @ 06:00:00 | Stop: 2021-11-28

## 2021-11-28 MED ADMIN — insulin lispro (HumaLOG) injection 0-20 Units: 0-20 [IU] | SUBCUTANEOUS | @ 14:00:00 | Stop: 2021-11-28

## 2021-11-28 NOTE — Unmapped (Signed)
6/21: per Dr. Margaretmary Bayley inbaset req [phone] - Called pt. and left a VM, also sent a WELL text to schedule follow up with him early July. Recall created too.

## 2021-11-28 NOTE — Unmapped (Signed)
Care Management  Initial Transition Planning Assessment              General  Care Manager assessed the patient by : In person interview with patient  Orientation Level: Oriented X4  Functional level prior to admission: Independent (the patient ambulates with a cane)  Reason for referral: Discharge Planning    Contact/Decision Maker  Extended Emergency Contact Information  Primary Emergency Contact: Glo Herring,  [place] [employer] Phone: [phone]  Relation: Daughter  Secondary Emergency Contact: Merian Capron  Address: around corner from pt           [place], [place]  Home Phone: [phone]  Work Phone: [phone]  Mobile Phone: [phone]  Relation: Sister    Legal Next of Kin / Guardian / POA / Advance Directives     HCDM (patient stated preference): Earle Gell - Daughter - [phone]    Advance Directive (Medical Treatment)  Does patient have an advance directive covering medical treatment?: Patient has advance directive covering medical treatment, copy not in chart. (pt icode status: DNR/DNI)    Health Care Decision Maker [HCDM] (Medical & Mental Health Treatment)  Healthcare Decision Maker: HCDM documented in the HCDM/Contact Info section.    Advance Directive (Mental Health Treatment)  Does patient have an advance directive covering mental health treatment?: Patient does not have advance directive covering mental health treatment.  Reason patient does not have an advance directive covering mental health treatment:: Patient does not wish to complete one at this time.    Readmission Information    Have you been hospitalized in the last 30 days?: No             Did the following happen with your discharge?            Patient Information  Lives with: Children (the patient lives with her son)    Type of Residence: Private residence        Location/Detail: [address] [place] [address]    Support Systems/Concerns: Family Members    Responsibilities/Dependents at home?: No    Home Care services in place prior to admission?: No          Equipment Currently Used at Home: walker, standard, commode chair       Currently receiving outpatient dialysis?: No       Financial Information       Need for financial assistance?: No       Social Determinants of Health  Social Determinants of Health     Financial Resource Strain: Medium Risk    Difficulty of Paying Living Expenses: Somewhat hard   Internet Connectivity: Not on file   Food Insecurity: Food Insecurity Present    Worried About Running Out of Food in the Last Year: Sometimes true    Ran Out of Food in the Last Year: Sometimes true   Tobacco Use: High Risk    Smoking Tobacco Use: Every Day    Smokeless Tobacco Use: Never    Passive Exposure: Not on file   Housing/Utilities: Low Risk     Within the past 12 months, have you ever stayed: outside, in a car, in a tent, in an overnight shelter, or temporarily in someone else's home (i.e. couch-surfing)?: No    Are you worried about losing your housing?: No    Within the past 12 months, have you been unable to get utilities (heat,  electricity) when it was really needed?: No   Alcohol Use: Not on file   Transportation Needs: Unmet Transportation Needs    Lack of Transportation (Medical): No    Lack of Transportation (Non-Medical): Yes   Substance Use: Not on file   Health Literacy: Low Risk     : Never   Physical Activity: Not on file   Interpersonal Safety: Not on file   Stress: Not on file   Intimate Partner Violence: Not on file   Depression: Not on file   Social Connections: Not on file       Complex Discharge Information    Is patient identified as a difficult/complex discharge?: No         Interventions:       Discharge Needs Assessment  Concerns to be Addressed: financial/insurance, coping/stress    Clinical Risk Factors: Multiple Diagnoses (Chronic), New Diagnosis    Barriers to taking medications: No    Prior overnight hospital stay or ED visit in last 90 days: No              Anticipated Changes Related to Illness: other (see comments) (to be determined by the treatment team)    Equipment Needed After Discharge: none    Discharge Facility/Level of Care Needs:      Readmission  Risk of Unplanned Readmission Score:  %  Predictive Model Details   No score data available for [facility] Risk of Unplanned Readmission     Readmitted Within the Last 30 Days? (No if blank)   Patient at risk for readmission?: No    Discharge Plan  Screen findings are: Care Manager reviewed the plan of the patient's care with the Multidisciplinary Team. No discharge planning needs identified at this time. Care Manager will continue to manage plan and monitor patient's progress with the team.    Expected Discharge Date:     Expected Transfer from Critical Care:      Quality data for continuing care services shared with patient and/or representative?: N/A (N/A at this time)  Patient and/or family were provided with choice of facilities / services that are available and appropriate to meet post hospital care needs?: N/A (N/A at this time)       Initial Assessment complete?: Yes

## 2021-11-28 NOTE — Unmapped (Signed)
[facility] at Home Daily Video Progress Note    Assessment/Plan:    Principal Problem:    AKI (acute kidney injury) (CMS-HCC)  Active Problems:    Status post deceased-donor kidney transplantation    Diabetes mellitus, type II (CMS-HCC)    Essential hypertension    Hyperlipidemia    Anemia in chronic kidney disease (CKD)    COPD (chronic obstructive pulmonary disease) (CMS-HCC)    Coronary artery disease of native artery of native heart with stable angina pectoris (CMS-HCC)    Diabetic neuropathy (CMS-HCC)    PAD (peripheral artery disease) (CMS-HCC)    Valvular heart disease    Immunocompromised patient (CMS-HCC)    GERD (gastroesophageal reflux disease)    Hypomagnesemia    Tobacco use disorder  Resolved Problems:    * No resolved hospital problems. *                 Michele Cisneros is a 66 y.o. female that presented to [facility] with AKI (acute kidney injury) (CMS-HCC).    AKI on CKD3 - h/o allograft renal transplant. Baseline creatinine 0.9-1.4. No indication for rejection as has improved w/ fluid resuscitation. Suspect AKI triggered by hypovolemia from diarrhea. Renal transplant US shows stable resistive indices in renal arteries. Nephrology recommends stopping Myfortic due to potential for diarrhea and starting on Azathioprine. Continue Tacrolimus, which is at goal (4-7).   Diarrhea - Infectious stool work up has been ordered, but it is still pending given lack of ongoing diarrhea. Adjustment of her immunosuppressants per above.  Chronic diastolic HF - Mild/mod MR/MS/AI/left ear. No evidence of hypervolemia currently after fluid replacement. Restart diuretic once creatinine less than 1.4.  HTN - BP noted to be mild/mod elevated. Patient has had issues w/ postural hypotension as well as recumbent HTN, which has made adjustment difficult. If continues to remain elevated, will add back on her home hydralazine.  DM - A1c at goal. Reports was restarted on Metformin after A1c increased above 7 last winter. Consider decreasing dose once her renal fxn resolves to baseline.    VTE prophylaxis : [place] prediction score = 1 (heart or respiratory failure (1)) <= 3 - will encourage ambulation     Estimated Date of Discharge: 1-2 days    In-home provider that assisted with visit was [facility], RRP.    I was outside the hospital [facility]).  I personally spent 25 minutes face-to-face and non-face-to-face in the care of this patient, which includes all pre, intra, and post visit time on the date of service.  All documented time was specific to the E/M visit and does not include any procedures that may have been performed.     The patient and/or parent/guardian has been advised of the potential risks and limitations of this mode of treatment (including, but not limited to, the absence of in-person examination) and has agreed to be treated using telemedicine. The  Patient's, patient's guardian's and/or patient's family's questions regarding telemedicine have been answered.     ___________________________________________________________________    Subjective:  Feels well overall. Hasn't had any diarrhea since she was admitted. No pain. No fever.    Labs/Studies:  Labs and Studies from the last 24hrs per EMR and Reviewed    Objective:  Video quality: Good  Audio quality: Good    Temp:  [36.5 ??C (97.7 ??F)-36.6 ??C (97.9 ??F)] 36.6 ??C (97.9 ??F)  Heart Rate:  [59-71] 71  SpO2 Pulse:  [61] 61  Resp:  [17-20] 18  BP: (124-174)/(40-69) 174/69  SpO2:  [98 %-100 %] 99 %  Body mass index is 22.52 kg/m??.    Gen: No acute distress  HEENT: [place]/AT, EOMI, no scleral icterus, mucous membranes moist  Lungs: Respirations unlabored, no retractions or use of accessory muscles.  No audible cough or wheezing.  Per in-home provider, clear to ausculation bilaterally.  No wheezes/rales/rhonchi.  Heart: Per in-home provider, regular rate and rhythm  [place]: Per in-home provider, no tenderness noted, soft, non-distended, normal bowel sounds.  Extremities: No edema, no discoloration  Skin: No jaundice or petechiae.    Neurologic: Alert and oriented x3, speech clear, no facial droop, no tremor, moves all extremities

## 2021-11-28 NOTE — Unmapped (Signed)
Internal Medicine (MEDU) Progress Note    Assessment & Plan:   Michele Cisneros is a 66 y.o. female with a PMHx of DM, HTN, CAD, multivalvular heart disease, chronic diarrhea, COPD and is status post deceased donor kidney transplant on 03/30/2019 (Kidney) for native kidney disease secondary to diabetic nephropathy who hpresents with AKI in setting of worsening diarrhea.     Principal Problem:    AKI (acute kidney injury) (CMS-HCC)  Active Problems:    Status post deceased-donor kidney transplantation    Diabetes mellitus, type II (CMS-HCC)    Essential hypertension    Hyperlipidemia    Anemia in chronic kidney disease (CKD)    COPD (chronic obstructive pulmonary disease) (CMS-HCC)    Coronary artery disease of native artery of native heart with stable angina pectoris (CMS-HCC)    Diabetic neuropathy (CMS-HCC)    PAD (peripheral artery disease) (CMS-HCC)    Valvular heart disease    Immunocompromised patient (CMS-HCC)    GERD (gastroesophageal reflux disease)    Hypomagnesemia    Tobacco use disorder  Resolved Problems:    * No resolved hospital problems. *    Acute Kidney Injury  S/p DD Kidney Transplant on Immunosuppression (2020)  Kidney disease 2/2 diabetic neprhopathy. Presents with AKI with cr 2.36 from baseline 0.9 to 1.4. This is in the setting of diarrhea. No h/o rejection but does have h/o recurrent UTIs post transplant. UA without evidence of UTI.  Renal transplant ultrasound unremarkable.  Suspect pre-renal in setting of diarrhea, now improving status post fluid bolus.  Nephrology following, recommend additional small bolus today and continuing to monitor creatinine, as well as transitioning Myfortic to azathioprine as below.  - 500 ml NS bolus  - DC home myfortic and change to azathioprine  - Continue home tacrolimus 11 mg daily  - Daily BMP, Mg, Phos  - Hold metformin  - Continue to hold diuretics until creatinine returns to less than 1.4 mg/dL     Diarrhea, improved  1 month of diarrhea in setting of myfortic use. Interestingly, patient denies being on myfortic but it is consistently filled by her pharmacy. CMV, EBV, other GI infection on differential given immunosuppression, although so far today has had no further loose stools suggesting may be mostly medication induced as we have been holding Myfortic as above.  - hold myfortic  - follow up EBV (pending), CMV (neg), BK pcr  - follow-up GI path panel  - follow-up tacrolimus levels as above     Chronic Problems  HFpEF - Valvular Heart Disease: Last echo in 06/2021 with mild/mod MR/MS/AI/AoS. Mild PH. Appears euvolemic, giving fluids as above. Holding home lasix while giving fluids, restart prn.   DM: Hold home metformin, SSI. HbA1C is 5.3%, may not need metformin on DC especially given GI sx as above.  HTN: Systolic BP 120-150s in ED. Resume home amlodipine, clonidine, imdur.   CAD: Continue home aspirin, statin.    Neuropathy: Continue home pregabalin    Daily Checklist:  Diet: Regular Diet  DVT PPx: Heparin 5000units q8h  Electrolytes: Replete Potassium to >/= 3.6 and Magnesium to >/= 1.8  Code Status: DNR and DNI  Dispo:  [facility]    Team Contact Information:   Primary Team: Internal Medicine (MEDU)  Primary Resident: Cherrie Gauze, MD  Resident's Pager: [phone] (Gen MedU Intern - Alvester Morin)    Interval History:   No acute events overnight.  Patient reports no further loose stools since she was admitted yesterday.  She is still  had some nausea, and has not eaten much yet.  Otherwise she feels okay with no new symptoms.    All other systems were reviewed and are negative except as noted in the HPI    Objective:   Temp:  [36.5 ??C (97.7 ??F)-36.6 ??C (97.9 ??F)] 36.6 ??C (97.9 ??F)  Heart Rate:  [59-68] 59  SpO2 Pulse:  [61] 61  Resp:  [17-20] 18  BP: (124-174)/(40-50) 144/41  SpO2:  [98 %-100 %] 100 %    Gen: NAD, converses   Heart: RRR  Lungs: CTAB, no crackles or wheezes  Abdomen: Soft and nontender to palpation, nondistended  Extremities: no edema  Neuro: grossly symmetric Skin:  No rashes, lesions on clothed exam  Psych: Alert, oriented     Labs/Studies: Labs and Studies from the last 24hrs per EMR and Reviewed    Cherrie Gauze MD  PGY1 Internal Medicine

## 2021-11-28 NOTE — Unmapped (Signed)
Pt made aware of pending transport to floor

## 2021-11-28 NOTE — Unmapped (Signed)
Transplant Nephrology Follow-Up Consult note      Assessment/Recommendations: Michele Cisneros is a 66 y.o. female with DM, HTN, CAD, multivalvular heart disease, chronic diarrhea, COPD and is status post deceased donor kidney transplant on 03/30/2019 (Kidney) for native kidney disease secondary to diabetic nephropathy who has been presented with AKI in setting of worsening diarrhea.     # AKI in transplanted kidney  # Kidney allograft function: (improved)  - Serum creatinine level is 1.78 from  2.36. Baseline is 0.9-1.4 mg/dL  - The AKI component is related to volume depletion and much less likely due to other potential possibilities like pyelonephritis, rejection, or obstruction.     - She has not history of rejection, proteinuria, or presence of DSAs.   - Will need transplant renal US as part of admission evaluation   - Given 1 liter on 6/21 with improvement, recommend 0.5 L fluid over 2 hours today     Acute on chronic diarrhea, nausea, vomiting with volume depletion  - Differential diagnosis includes viral gastroenteritis such as norovirus, CMV, COVID-19 or other infectious etiologies including UTI versus medication (metformin and mycophenolate) associated diarrhea.   - Will stop Myfortic and change to azathioprine  - CMV negative  - GI pathogen panel & EBV viral load pending  - Stop metformin   - Continue to hold diuretics until creatinine returns to less than 1.4 mg/dL     Immunosuppression Management   - Continue Envarsus XR 11 mg daily while awaiting her level (goal 4-7). Diarrhea is often associated with increased levels so she may need dose reduction.   - Will stop Myfortic due to GI effects. Will start Azathioprine 100 mg daily     History of recurrent post-transplant UTI:   - Most recent UTI 06/25/21 grew klebsiella and E.coli and was treated with ceftriaxone followed by cefdinir for 7 days  - Other UTIs include proteus isolated on 11/28/19, 12/26/19,02/27/20 and 03/19/20 with citrobacter isolated 03/19/20  - Has had urinary retention in the past as a risk factor.  - UA negative this admission     Chronic CHF with diastolic dysfunction and valvular heart disease  - Hospitalized for acute on chronic CHF in January 2023  - Echocardiogram 06/27/21: mild to moderate MR/MS/AI/AS  - Volume repletion needs to be in the hospital given the risk of precipitating CHF with IVF.   - Will need to hold diuretics temporarily, can restart when creatinine less than 1.4 mg/dL  - ACE inhibitor/ARB therapy not initiated due to hyperkalemia and recent AKI.  - May benefit from SGLT-2 inhibitor eventually  - She follows with her local Cardiologist, Dr.Gollan     Hypertension, with history of postural hypotension and syncope   - She was symptomatic upon standing in clinic  - The balance between symptomatic postural hypotension and recumbent or seated HTN has been challenging  - Will address anihypertensive regimen during this hospitalization. For now can continue current regimen of amlodipine 10 mg daily, clonidine 0.1 mg bid, hydralazine 50 mg tid, isosorbide. Would restart hydralazine 50 mg TID on discharge. Hold Lasix 40 mg BID     History of CMV viremia  - Viral load negative 07/24/21   - CMV viral load from 11/27/21 is negative  - On no therapy currently    **Transplant patients with an open wound require wound care with sterile water only. The patient should be counseled on this at the time of discharge if they have not already been doing this.  Recommendations were communicated to primary service      [name]  Division of Nephrology and Hypertension  [facility]  11/28/2021  12:16 PM    ___________________________________________________________        Subjective: Reports no diarrhea overnight. Creatinine improved with 1 liter of IVF. Denies chest pain or shortness of breath.      Medications:   Current Facility-Administered Medications   Medication Dose Route Frequency Provider Last Rate Last Admin acetaminophen (TYLENOL) tablet 650 mg  650 mg Oral Q6H PRN Cherrie Gauze, MD        albuterol (PROVENTIL HFA;VENTOLIN HFA) 90 mcg/actuation inhaler 2 puff  2 puff Inhalation Q6H PRN Cherrie Gauze, MD        amLODIPine (NORVASC) tablet 10 mg  10 mg Oral Daily Cherrie Gauze, MD   10 mg at 11/28/21 0945    aspirin chewable tablet 81 mg  81 mg Oral Daily Cherrie Gauze, MD   81 mg at 11/28/21 0943    atorvastatin (LIPITOR) tablet 20 mg  20 mg Oral Daily Cherrie Gauze, MD   20 mg at 11/28/21 0945    azaTHIOprine (IMURAN) tablet 100 mg  100 mg Oral Daily [name], DO        cloNIDine HCL (CATAPRES) tablet 0.2 mg  0.2 mg Oral BID Cherrie Gauze, MD   0.2 mg at 11/28/21 0944    dextrose (D10W) 10% bolus 125 mL  12.5 g Intravenous Q10 Min PRN Kathrin Ruddy, MD        glucagon injection 1 mg  1 mg Intramuscular Once PRN Kathrin Ruddy, MD        glucose chewable tablet 16 g  16 g Oral Q10 Min PRN Kathrin Ruddy, MD        heparin (porcine) 5,000 unit/mL injection 5,000 Units  5,000 Units Subcutaneous [facility] Cherrie Gauze, MD   5,000 Units at 11/28/21 0551    insulin lispro (HumaLOG) injection 0-20 Units  0-20 Units Subcutaneous ACHS Kathrin Ruddy, MD   1 Units at 11/28/21 0946    isosorbide mononitrate (IMDUR) 24 hr tablet 30 mg  30 mg Oral Daily Cherrie Gauze, MD   30 mg at 11/28/21 0944    melatonin tablet 3 mg  3 mg Oral Nightly PRN Cherrie Gauze, MD   3 mg at 11/28/21 0152    ondansetron (ZOFRAN-ODT) disintegrating tablet 4 mg  4 mg Oral Q8H PRN Cherrie Gauze, MD        Or    ondansetron [facility]) injection 4 mg  4 mg Intravenous Q8H PRN Cherrie Gauze, MD        pantoprazole (PROTONIX) EC tablet 20 mg  20 mg Oral Daily Cherrie Gauze, MD   20 mg at 11/28/21 0943    pregabalin (LYRICA) capsule 150 mg  150 mg Oral Nightly Cherrie Gauze, MD   150 mg at 11/27/21 2135    pregabalin (LYRICA) capsule 75 mg  75 mg Oral Daily Cherrie Gauze, MD   75 mg at 11/28/21 0944    sodium bicarbonate tablet 650 mg  650 mg Oral BID Cherrie Gauze, MD   650 mg at 11/28/21 0945    sodium chloride 0.9% (NS) bolus 500 mL  500 mL Intravenous Once Cherrie Gauze, MD        tacrolimus (ENVARSUS XR) extended release tablet 11 mg  11 mg Oral Daily Cherrie Gauze, MD              Review of Systems:  A 12 system review of systems was negative except as noted in HPI.  All other systems reviewed and negative    Physical Exam:  Vitals:    11/28/21 1131   BP: 144/41   Pulse: 59   Resp: 18   Temp: 36.6 ??C (97.9 ??F)   SpO2: 100%     I/O this shift:  In: 300 [P.O.:300]  Out: 0     Intake/Output Summary (Last 24 hours) at 11/28/2021 1216  Last data filed at 11/28/2021 1200  Gross per 24 hour   Intake 400 ml   Output 850 ml   Net -450 ml     General: well-appearing, no acute distress  HEENT: anicteric sclera, oropharynx clear without lesions  CV: regular rate, normal rhythm, no murmurs, no gallops, no rubs, no peripheral edema  Lungs: clear to auscultation bilaterally, normal work of breathing  Abdomen: soft, non-tender, non-distended, graft site nontender  Skin: no visible lesions or rashes  Psych: alert, engaged, appropriate mood and affect  Musculoskeletal: no obvious deformities  Neuro: normal speech, no gross focal deficits    Test Results  Reviewed  Lab Results   Component Value Date    NA 137 11/28/2021    K 4.6 11/28/2021    CL 106 11/28/2021    CO2 23.0 11/28/2021    BUN 24 (H) 11/28/2021    CREATININE 1.79 (H) 11/28/2021    GLU 202 (H) 11/28/2021    CALCIUM 9.5 11/28/2021    ALBUMIN 3.5 11/28/2021    PHOS 3.6 11/28/2021

## 2021-11-28 NOTE — Unmapped (Signed)
Pt admitted from ED with C/o diarrhea. A&O X 4. Pt on RA.VS stable. Denies any pain or SOB.  Pt on Enteric precaution to rule out C.Diff. No BM since admission. Voiding well without any difficulty. Call bell within reach. Fall precaution maintain.   Problem: Adult Inpatient Plan of Care  Goal: Plan of Care Review  Outcome: Ongoing - Unchanged  Goal: Patient-Specific Goal (Individualized)  Outcome: Ongoing - Unchanged  Goal: Absence of Hospital-Acquired Illness or Injury  Outcome: Ongoing - Unchanged  Intervention: Identify and Manage Fall Risk  Recent Flowsheet Documentation  Taken 11/27/2021 2200 by Milton Ferguson, RN  Safety Interventions:   low bed   lighting adjusted for tasks/safety  Intervention: Prevent and Manage VTE (Venous Thromboembolism) Risk  Recent Flowsheet Documentation  Taken 11/27/2021 2200 by Milton Ferguson, RN  Activity Management: activity adjusted per tolerance  VTE Prevention/Management:   ambulation promoted   anticoagulant therapy  Intervention: Prevent Infection  Recent Flowsheet Documentation  Taken 11/27/2021 2200 by Milton Ferguson, RN  Infection Prevention: rest/sleep promoted  Goal: Optimal Comfort and Wellbeing  Outcome: Ongoing - Unchanged  Goal: Readiness for Transition of Care  Outcome: Ongoing - Unchanged  Goal: Rounds/Family Conference  Outcome: Ongoing - Unchanged     Problem: Fall Injury Risk  Goal: Absence of Fall and Fall-Related Injury  Outcome: Ongoing - Unchanged  Intervention: Promote Injury-Free Environment  Recent Flowsheet Documentation  Taken 11/27/2021 2200 by Milton Ferguson, RN  Safety Interventions:   low bed   lighting adjusted for tasks/safety     Problem: Infection  Goal: Absence of Infection Signs and Symptoms  Outcome: Ongoing - Unchanged  Intervention: Prevent or Manage Infection  Recent Flowsheet Documentation  Taken 11/27/2021 2200 by Milton Ferguson, RN  Infection Management: aseptic technique maintained  Isolation Precautions: enteric precautions initiated     Problem: Self-Care Deficit  Goal: Improved Ability to Complete Activities of Daily Living  Outcome: Ongoing - Unchanged

## 2021-11-28 NOTE — Unmapped (Cosign Needed)
Inpatient Tobacco Cessation Counseling Note    This medical encounter was conducted virtually using [email or web address]  TeleHealth protocols.    I have identified myself to the patient and conveyed my credentials to Michele Cisneros  I have explained the capabilities and limitations of telemedicine and the patient/proxy and myself both agree that it is appropriate for their current circumstances/symptoms.     Contact Information  Person Contacted: Michele Cisneros         Contact Phone number: [phone]      Phone Outcome: Spoke with pt  Is there someone else in the room? No.     Patient's location at the time of the telephone visit: Hospitalized at [facility]   Provider's location at the time of the telephone visit: At home, in [place]      Purpose of contact:     Pt participated in a telephone visit for tobacco cessation counseling.  Patient was admitted to hospital for No admission diagnoses are documented for this encounter.. Patient consented to telephone visit given due to social isolation measures in place due to the COVID-19 pandemic.     Tobacco Use History and Assessment  Time Since Last Tobacco Use: 1 to 7 days ago  Tobacco Withdrawal (Past 24 Hours): None noted  Type of Tobacco Products Used: Cigarettes  Quantity Used: 5  Quantity Per: day  Other Household Members Use Tobacco: Yes  Smoking Allowed in Home: Yes    Behavioral Assessment  Why Uses: 1. habit 2. stress-relief  Reasons to Become Tobacco Free: I'm just tired of it  Barriers/Challenges: 1. long-standing habit 2. son smokes in the home  Strategies: 1. pt can use hand to mouth replacements 2. pt can make home smoke-free    NOTE: Pt reports smoking 5cpd and is trying to become tobacco-free, stating I'm just tired of it. Pt expresses it will not be difficult for her to quit as she can take it or leave it when it comes to cigarettes. Her son smokes as well and they do so inside the home. SW discussed the benefit of making the home smoke-free to reduce second-hand smoke exposure. Pt denies cravings and declines NRT. SW reviewed strategies to help pt manage any future urges and triggers. SW provided education on how cessation aids in AKI healing. SW provided pt with contact information, physical improvements related to tobacco cessation, and available resource (including outpt Tobacco Treatment Program at [facility] and [place] [facility]).    Treatment Plan  Please see below for medication recommendations in bold.   Cessation Meds Currently Using: None  Medications Recommended During Hospitalization: Patient declines  Outpatient/Discharge Medications Recommended: Patient declines  Patient's Plan Post Discharge/Visit: Plan to quit as soon as possible      As part of this Telephone Visit, no in-person exam was conducted.     I personally spent 11 minutes counseling the patient via telephone about tobacco cessation.  I spent an additional 9 minutes on pre- and post-visit activities.      The patient was physically located in [place] or a state in which I am permitted to provide care. The patient and/or parent/guardian understood that s/he may incur co-pays and cost sharing, and agreed to the telemedicine visit. The visit was reasonable and appropriate under the circumstances given the patient's presentation at the time.     The patient and/or parent/guardian has been advised of the potential risks and limitations of this mode of treatment (including, but not limited  to, the absence of in-person examination) and has agreed to be treated using telemedicine. The patient's/patient's family's questions regarding telemedicine have been answered.      If the visit was completed in an ambulatory setting, the patient and/or parent/guardian has also been advised to contact their provider???s office for worsening conditions, and seek emergency medical treatment and/or call 911 if the patient deems either necessary.     Visit Format/Coding: Telephone Coding: 30160 (11-20 minutes)  Service rendered over the phone most consistent with: Tobacco cessation counseling, greater than 10 minutes (10932)     Sara Chu, LCSW, LCAS, NCTTP  Clinical Social Worker / Tobacco Treatment Specialist  Tobacco Treatment Program  [facility]  phone: [phone]  pager: [phone]

## 2021-11-28 NOTE — Unmapped (Signed)
OCCUPATIONAL THERAPY  Evaluation (11/28/21 0908)    Patient Name:  Michele Cisneros       Medical Record Number: [id number]   Date of Birth: 05-07-1956  Sex: Female            OT Treatment Diagnosis:  OT eval only    Assessment  Problem List: Decreased endurance        Clinical Decision Making: Low    Assessment: 66 y.o. female with DM, HTN, CAD, multivalvular heart disease, chronic diarrhea, COPD and is status post deceased donor kidney transplant on 03/30/2019 (Kidney) for native kidney disease secondary to diabetic nephropathy who has been presented with AKI in setting of worsening diarrhea. Consideration of pt's occupational profile, assessment review, level of clinical decision making involved, and intervention plan, indicates pt is currently a low complexity case, presenting to OT at or near baseline with self-care BADL.  This indicates pt has no further skilled acute or post-acute OT needs.      Today's Interventions: ADL retraining, Balance activities, Bed mobility, Education - Patient, Endurance activities, Functional mobility, Safety education, Transfer training, Other      Today's Interventions: Pt. ed on role of OT/POC. Mod I bed mobility w/ HOB elevated. Pt. able to achieve figure four seated position for LBD. Ind STS no AD and functional moblity around room indicating pt. can complete household distances. Ind transfer onto BSC, mod I EOB to supine w/ HOB elevated.    Activity Tolerance During Today's Session  Tolerated treatment well    Plan  Planned Frequency of Treatment:  D/C Services for: D/C Services      Post-Discharge Occupational Therapy Recommendations:       OT DME Recommendations: Tub Transfer Bench -      Prognosis:  Good    Barriers to Discharge: None    Subjective  Current Status Pt. recieved/left supine w/ HOB elevated, call bellin reach, all needs met, NA present at bedside at end of session, RN Nyasia updated/aware      Prior Functional Status Pt. reports being independent at baseline using SPC as needed, son lives w/ her; Pt. reports son assists w/ transfer into tub.            Patient / Caregiver reports: I'm good baby    Past Medical History:   Diagnosis Date    End stage renal disease (CMS-HCC) 11/08/2012    ESRD on hemodialysis (CMS-HCC) 08/21/2011    Pt gets dialysis with [facility] in [address], [place], on a TTS schedule.  She started dialysis in Feb 2014.  Etiology of renal failure not known, likely diabetes.  Has a left upper arm AV graft as of Dec 2014.   On transplant list since 2015.  Dr. Cherylann Ratel RN [facility] [phone]  Last Assessment & Plan:  Stable. continues to follow with lateef. On kidney transplant list.    Other and unspecified hyperlipidemia 11/08/2012    Type II or unspecified type diabetes mellitus 11/08/2012    Unspecified essential hypertension 11/08/2012    Social History     Tobacco Use    Smoking status: Every Day     Packs/day: 0.25     Years: 30.00     Pack years: 7.50     Types: Cigarettes    Smokeless tobacco: Never    Tobacco comments:     5cpd   Substance Use Topics    Alcohol use: Not Currently     Comment: very rare use (reports once over  last year)      Past Surgical History:   Procedure Laterality Date    AV FISTULA PLACEMENT      HYSTERECTOMY  2000    PR TRANSPLANTATION OF KIDNEY Left 03/30/2019    Procedure: RENAL ALLOTRANSPLANTATION, IMPLANTATION OF GRAFT; WITHOUT RECIPIENT NEPHRECTOMY;  Surgeon: Loney Hering, MD;  Location: MAIN OR [place];  Service: Transplant    PR UP GI ENDOSCOPY,DILATN W GUIDE N/A 07/05/2019    Procedure: UGI ENDOSCOPY; WITH INSERTION OF GUIDE WIRE FOLLOWED BY DILATION OF ESOPHAGUS OVER GUIDE WIRE;  Surgeon: Chriss Driver, MD;  [facility]: [facility]  Service: Gastroenterology    PR UPPER GI ENDOSCOPY,BIOPSY N/A 05/04/2019    Procedure: UGI ENDOSCOPY; WITH BIOPSY, SINGLE OR MULTIPLE;  Surgeon: Jules Husbands, MD;  [facility]: [facility];  Service: Gastroenterology    TOTAL ABDOMINAL HYSTERECTOMY W/ BILATERAL SALPINGOOPHORECTOMY  2000    Family History   Problem Relation Age of Onset    Heart disease Mother     Diabetes Mother     Cancer Father     Colon cancer Father 69    Colon cancer Sister 60    Breast cancer Sister         Hydrocodone     Objective Findings  Precautions / Restrictions   (enteric)    Weight Bearing  Non-applicable    Required Braces or Orthoses  Non-applicable    Communication Preference  Verbal    Pain  Denied pain    Equipment / Environment  Vascular access (PIV, TLC, Port-a-cath, PICC)    Living Situation  Living Environment: Apartment  Lives With: Son  Home Living: One level home, Level entry, Tub/shower unit  Equipment available at home: Straight cane     Cognition   Orientation Level:  Oriented x 4   Arousal/Alertness:  Appropriate responses to stimuli   Attention Span:  Appears intact   Memory:  Appears intact   Following Commands:  Follows all commands and directions without difficulty   Safety Judgment:  Good awareness of safety precautions   Awareness of Errors:  Good awareness of errors made   Problem Solving:  Able to problem solve independently    Vision / Hearing   Vision: Wears glasses for reading only     Hearing: No deficit identified         Hand Function:  Right Hand Function: Right hand grip strength, ROM and coordination WNL  Left Hand Function: Left hand grip strength, ROM and coordination WNL  Hand Dominance: Right    Skin Inspection:  Skin Inspection: Intact where visualized    ROM / Strength:  UE ROM/Strength: Left WFL, Right WFL  LE ROM/Strength: Left WFL, Right WFL    Coordination:  Coordination: WFL    Sensation:  Sensory/ Proprioception/ Stereognosis comments: Reports peripheral neuropathy BLEs, worse on RLE    Balance:  Ind no AD    Functional Mobility  Transfer Assistance Needed: No  Bed Mobility Assistance Needed: No  Ambulation: Ind no AD      ADLs  ADLs: Modified Independent, Supervision      Vitals / Orthostatics  At Rest: NAD  With Activity: NAD  Vitals/Orthostatics: asymptomatic      Medical Staff Made Aware: RN Nyasia      Occupational Therapy Session Duration  OT Individual [mins]: 25       AM-PAC-Daily Activity  Lower Body Dressing assistance needs: None - Modified Independent/Independent  Bathing assistance needs: A Little -  Minimal/Contact Guard Assist/Supervision  Toileting assistance needs: None - Modified Independent/Independent  Upper Body Dressing assistance needs: None - Modified Independent/Independent  Personal Grooming assistance needs: None - Modified Independent/Independent  Eating Meals assistance needs: None - Modified Independent/Independent    Daily Activity Score:  Daily Activity Score: 23    Score (in points): % of Functional Impairment, Limitation, Restriction  6: 100% impaired, limited, restricted  7-8: At least 80%, but less than 100% impaired, limited restricted  9-13: At least 60%, but less than 80% impaired, limited restricted  14-19: At least 40%, but less than 60% impaired, limited restricted  20-22: At least 20%, but less than 40% impaired, limited restricted  23: At least 1%, but less than 20% impaired, limited restricted  24: 0% impaired, limited restricted      I attest that I have reviewed the above information.  Signed: Jeoffrey Massed, OT  Ceasar Mons 11/28/2021

## 2021-11-28 NOTE — Unmapped (Signed)
VENOUS ACCESS TEAM PROCEDURE    Order was placed for a PIV by Venous Access Team (VAT).  Patient was assessed at bedside for placement of a PIV. PPE were donned per protocol.  Access was obtained. Blood return noted.  Dressing intact and device well secured.  Flushed with normal saline.  See LDA for details.  Pt advised to inform RN of any s/s of discomfort at the PIV site.    Workup / Procedure Time:  15 minutes       primary care  RN was notified.       Thank you,     Cyndie Mull RN Venous Access Team

## 2021-11-28 NOTE — Unmapped (Signed)
PHYSICAL THERAPY  Evaluation (11/28/21 1005)          Patient Name:  Michele Cisneros       Medical Record Number: [id number]   Date of Birth: Nov 20, 1955  Sex: Female        Treatment Diagnosis: Mobility eval s/p admission for diarrhea     Activity Tolerance: Tolerated treatment well     ASSESSMENT  Problem List:  (No active PT problems)      Assessment : Pt is a 66 yo F presenting to PT s/p above problems. She is at baseline, independently ambulatory and has no acute PT needs at this time. No post acute needs. Low complexity eval 2/2 PMH, acute hospital problems and social factors.      Today's Interventions: Reviewed role of PT, POC                            PLAN  Planned Frequency of Treatment:  D/C Services for: D/C Services       Planned Interventions:       Post-Discharge Physical Therapy Recommendations:  Skilled PT services NOT indicated     PT DME Recommendations: None (owns needed DME)            Goals:   Patient and Family Goals: To go home           Prognosis:  Good  Positive Indicators: PLOF  Barriers to Discharge: None     SUBJECTIVE  Patient reports: RN cleared pt for PT, pt agreeable to PT  Current Functional Status: Pt started/ended session reclined in bed, alert and in NAD.     Prior Functional Status: Independent, uses SPC as needed at home  Equipment available at home: Straight cane      Past Medical History:   Diagnosis Date    End stage renal disease (CMS-HCC) 11/08/2012    ESRD on hemodialysis (CMS-HCC) 08/21/2011    Pt gets dialysis with [facility] in [address], [place], on a TTS schedule.  She started dialysis in Feb 2014.  Etiology of renal failure not known, likely diabetes.  Has a left upper arm AV graft as of Dec 2014.   On transplant list since 2015.  Dr. Cherylann Ratel RN [facility] [phone]  Last Assessment & Plan:  Stable. continues to follow with lateef. On kidney transplant list.    Other and unspecified hyperlipidemia 11/08/2012    Type II or unspecified type diabetes mellitus 11/08/2012 Unspecified essential hypertension 11/08/2012            Social History     Tobacco Use    Smoking status: Every Day     Packs/day: 0.25     Years: 30.00     Pack years: 7.50     Types: Cigarettes     Last attempt to quit: 05/09/2013     Years since quitting: 8.5    Smokeless tobacco: Never    Tobacco comments:     1 pack every week; she reports historical periods of abstinence   Substance Use Topics    Alcohol use: Not Currently     Comment: very rare use (reports once over last year)       Past Surgical History:   Procedure Laterality Date    AV FISTULA PLACEMENT      HYSTERECTOMY  2000    PR TRANSPLANTATION OF KIDNEY Left 03/30/2019    Procedure: RENAL ALLOTRANSPLANTATION, IMPLANTATION OF GRAFT; WITHOUT RECIPIENT  NEPHRECTOMY;  Surgeon: Loney Hering, MD;  Location: MAIN OR [place];  Service: Transplant    PR UP GI ENDOSCOPY,DILATN W GUIDE N/A 07/05/2019    Procedure: UGI ENDOSCOPY; WITH INSERTION OF GUIDE WIRE FOLLOWED BY DILATION OF ESOPHAGUS OVER GUIDE WIRE;  Surgeon: Chriss Driver, MD;  [facility]: [facility];  Service: Gastroenterology    PR UPPER GI ENDOSCOPY,BIOPSY N/A 05/04/2019    Procedure: UGI ENDOSCOPY; WITH BIOPSY, SINGLE OR MULTIPLE;  Surgeon: Jules Husbands, MD;  Location: [facility];  Service: Gastroenterology    TOTAL ABDOMINAL HYSTERECTOMY W/ BILATERAL SALPINGOOPHORECTOMY  2000             Family History   Problem Relation Age of Onset    Heart disease Mother     Diabetes Mother     Cancer Father     Colon cancer Father 89    Colon cancer Sister 54    Breast cancer Sister         Allergies: Hydrocodone                  Objective Findings  Precautions / Restrictions  Precautions:  (enteric)  Weight Bearing Status: Non-applicable  Required Braces or Orthoses: Non-applicable     Communication Preference: Verbal          Pain Comments: Denies  Medical Tests / Procedures: Imaging, labs reviewed  Equipment / Environment: Vascular access (PIV, TLC, Port-a-cath, PICC)     At Rest: VSS  With Activity: VSS           Living Situation  Living Environment: Apartment  Lives With: Son  Home Living: One level home, Level entry, Tub/shower unit      [facility]        Skin Inspection: Intact where visualized     Upper Extremities  UE Strength: Right WFL, Left WFL    Lower Extremities  LE Strength: Right WFL, Left WFL            Supine to Sit assistance level: Independent     Transfers: Sit to Stand  Sit to Stand assistance level: Independent      Gait Level of Assistance: Independent  Gait Assistive Device: None  Gait Distance Ambulated (ft): 30 ft                        Physical Therapy Session Duration  PT Individual [mins]: 15     Medical Staff Made Aware: RN     I attest that I have reviewed the above information.  Signed: Starr Lake, PT  Filed 11/28/2021

## 2021-11-29 LAB — CBC
HEMATOCRIT: 34.2 % (ref 34.0–44.0)
HEMOGLOBIN: 11.6 g/dL (ref 11.3–14.9)
MEAN CORPUSCULAR HEMOGLOBIN CONC: 33.8 g/dL (ref 32.0–36.0)
MEAN CORPUSCULAR HEMOGLOBIN: 31.1 pg (ref 25.9–32.4)
MEAN CORPUSCULAR VOLUME: 92 fL (ref 77.6–95.7)
MEAN PLATELET VOLUME: 11 fL — ABNORMAL HIGH (ref 6.8–10.7)
PLATELET COUNT: 111 10*9/L — ABNORMAL LOW (ref 150–450)
RED BLOOD CELL COUNT: 3.72 10*12/L — ABNORMAL LOW (ref 3.95–5.13)
RED CELL DISTRIBUTION WIDTH: 15.7 % — ABNORMAL HIGH (ref 12.2–15.2)
WBC ADJUSTED: 5.7 10*9/L (ref 3.6–11.2)

## 2021-11-29 LAB — MYCOPHENOLATE
MPA GLUCURONIDE: 71 ug/mL
MYCOPHENOLATE: 1.7 ug/mL

## 2021-11-29 LAB — BK VIRUS QUANTITATIVE PCR, BLOOD: BK BLOOD RESULT: NOT DETECTED

## 2021-11-29 LAB — RENAL FUNCTION PANEL
ALBUMIN: 3.8 g/dL (ref 3.4–5.0)
ANION GAP: 10 mmol/L (ref 5–14)
BLOOD UREA NITROGEN: 26 mg/dL — ABNORMAL HIGH (ref 9–23)
BUN / CREAT RATIO: 14
CALCIUM: 9.7 mg/dL (ref 8.7–10.4)
CHLORIDE: 107 mmol/L (ref 98–107)
CO2: 24 mmol/L (ref 20.0–31.0)
CREATININE: 1.8 mg/dL — ABNORMAL HIGH
EGFR CKD-EPI (2021) FEMALE: 31 mL/min/{1.73_m2} — ABNORMAL LOW (ref >=60)
GLUCOSE RANDOM: 123 mg/dL (ref 70–179)
PHOSPHORUS: 3.9 mg/dL (ref 2.4–5.1)
POTASSIUM: 4.6 mmol/L (ref 3.4–4.8)
SODIUM: 141 mmol/L (ref 135–145)

## 2021-11-29 LAB — TACROLIMUS LEVEL, TIMED: TACROLIMUS BLOOD: 6.5 ng/mL

## 2021-11-29 LAB — MAGNESIUM: MAGNESIUM: 1.4 mg/dL — ABNORMAL LOW (ref 1.6–2.6)

## 2021-11-29 MED ADMIN — cloNIDine HCL (CATAPRES) tablet 0.2 mg: .2 mg | ORAL | @ 13:00:00

## 2021-11-29 MED ADMIN — pregabalin (LYRICA) capsule 75 mg: 75 mg | ORAL | @ 13:00:00

## 2021-11-29 MED ADMIN — isosorbide mononitrate (IMDUR) 24 hr tablet 30 mg: 30 mg | ORAL | @ 13:00:00

## 2021-11-29 MED ADMIN — sodium bicarbonate tablet 650 mg: 650 mg | ORAL | @ 01:00:00

## 2021-11-29 MED ADMIN — azaTHIOprine (IMURAN) tablet 100 mg: 100 mg | ORAL | @ 15:00:00

## 2021-11-29 MED ADMIN — pantoprazole (PROTONIX) EC tablet 20 mg: 20 mg | ORAL | @ 13:00:00

## 2021-11-29 MED ADMIN — aspirin chewable tablet 81 mg: 81 mg | ORAL | @ 13:00:00

## 2021-11-29 MED ADMIN — pregabalin (LYRICA) capsule 150 mg: 150 mg | ORAL | @ 01:00:00

## 2021-11-29 MED ADMIN — sodium bicarbonate tablet 650 mg: 650 mg | ORAL | @ 13:00:00

## 2021-11-29 MED ADMIN — amLODIPine (NORVASC) tablet 10 mg: 10 mg | ORAL | @ 13:00:00

## 2021-11-29 MED ADMIN — atorvastatin (LIPITOR) tablet 20 mg: 20 mg | ORAL | @ 13:00:00

## 2021-11-29 MED ADMIN — cloNIDine HCL (CATAPRES) tablet 0.2 mg: .2 mg | ORAL | @ 01:00:00

## 2021-11-29 MED ADMIN — tacrolimus (ENVARSUS XR) extended release tablet 11 mg: 11 mg | ORAL | @ 13:00:00

## 2021-11-29 MED ADMIN — acetaminophen (TYLENOL) tablet 650 mg: 650 mg | ORAL | @ 13:00:00

## 2021-11-29 NOTE — Unmapped (Addendum)
Called pt via kit phone, son answered w/ pt in background. Pt reports taking home meds at 2100 when she attempted to call [employer], both lines busy at the time. Verbally verified what was taken. Med cart delivered but would like to verify in AM. Advised to use [facility]ERS for any needs throughout night.     Problem: Adult Inpatient Plan of Care  Goal: Plan of Care Review  Outcome: Ongoing - Unchanged  Goal: Patient-Specific Goal (Individualized)  Outcome: Ongoing - Unchanged  Goal: Absence of Hospital-Acquired Illness or Injury  Outcome: Ongoing - Unchanged  Goal: Optimal Comfort and Wellbeing  Outcome: Ongoing - Unchanged  Goal: Readiness for Transition of Care  Outcome: Ongoing - Unchanged  Goal: Rounds/Family Conference  Outcome: Ongoing - Unchanged     Problem: Fall Injury Risk  Goal: Absence of Fall and Fall-Related Injury  Outcome: Ongoing - Unchanged     Problem: Infection  Goal: Absence of Infection Signs and Symptoms  Outcome: Ongoing - Unchanged     Problem: Self-Care Deficit  Goal: Improved Ability to Complete Activities of Daily Living  Outcome: Ongoing - Unchanged

## 2021-11-29 NOTE — Unmapped (Signed)
Patient admitted to [facility] today from [facility].  Patient alert and oriented and able to voice needs.  No falls noted, no skin breakdown, patient denies pain.  Vital signs stable.  Patient denies loss of appetite and diarrhea.     Problem: Adult Inpatient Plan of Care  Goal: Plan of Care Review  Outcome: Progressing  Goal: Patient-Specific Goal (Individualized)  Outcome: Progressing  Goal: Absence of Hospital-Acquired Illness or Injury  Outcome: Progressing  Goal: Optimal Comfort and Wellbeing  Outcome: Progressing  Goal: Readiness for Transition of Care  Outcome: Progressing  Goal: Rounds/Family Conference  Outcome: Progressing     Problem: Fall Injury Risk  Goal: Absence of Fall and Fall-Related Injury  Outcome: Progressing     Problem: Infection  Goal: Absence of Infection Signs and Symptoms  Outcome: Progressing     Problem: Self-Care Deficit  Goal: Improved Ability to Complete Activities of Daily Living  Outcome: Progressing

## 2021-11-29 NOTE — Unmapped (Signed)
[facility] Daily Progress Note      Michele Cisneros is a  66 y.o.  female  with Principal Problem:    AKI (acute kidney injury) (CMS-HCC)  Active Problems:    Status post deceased-donor kidney transplantation    Diabetes mellitus, type II (CMS-HCC)    Essential hypertension    Hyperlipidemia    Anemia in chronic kidney disease (CKD)    COPD (chronic obstructive pulmonary disease) (CMS-HCC)    Coronary artery disease of native artery of native heart with stable angina pectoris (CMS-HCC)    Diabetic neuropathy (CMS-HCC)    PAD (peripheral artery disease) (CMS-HCC)    Valvular heart disease    Immunocompromised patient (CMS-HCC)    GERD (gastroesophageal reflux disease)    Hypomagnesemia    Tobacco use disorder      Assessment/Plan:       Michele Cisneros is a 66 y.o. female that presented to [facility] with AKI (acute kidney injury) (CMS-HCC).     AKI on CKD3 - h/o allograft renal transplant. Baseline creatinine 0.9-1.4. No indication for rejection as has improved w/ fluid resuscitation. Suspect AKI triggered by hypovolemia from diarrhea. Renal transplant US shows stable resistive indices in renal arteries. Nephrology recommends stopping Myfortic due to potential for diarrhea and starting on Azathioprine. Continue Tacrolimus, which is at goal (4-7).  6/23 creatinine 1.80- unchanged from yesterday  Diarrhea - Infectious stool work up has been ordered, but it is still pending given lack of ongoing diarrhea. Adjustment of her immunosuppressants per above. No bm overnight or today  Chronic diastolic HF - Mild/mod MR/MS/AI/left ear. No evidence of hypervolemia currently after fluid replacement. Restart diuretic once creatinine less than 1.4.  HTN - BP noted to be mild/mod elevated. Patient has had issues w/ postural hypotension as well as recumbent HTN, which has made adjustment difficult. If continues to remain elevated, will add back on her home hydralazine.  DM - A1c at goal. Reports was restarted on Metformin after A1c increased above 7 last winter. Consider decreasing dose once her renal fxn resolves to baseline.  Hypomagnesemia- will order replacement per IV due to diarrhea. First dose this evening and second tomorrow.         Add magnesium to labs tomorrow     VTE prophylaxis : [place] prediction score = 1 (heart or respiratory failure (1)) <= 3 - will encourage ambulation      Estimated Date of Discharge: plan for discharge tomorrow if creatinine stable to improved for outpatient follow up  Dr. Albin Felling reviewed pertinent portions of the physical exam and collaborated in the formulation of the plan of care and documentation of this visit for [name].              Subjective:      Michele Cisneros is resting in her bed watching TV (house is only 1 bedroom and son lives in front room). She is alert and feels well. Happy to be at home. States she is eating well and has had 2 bottles of water today and 1 bottle of pepsi. No n/v. No abdominal pain. Pain is 7/10 hurts all over mostly in back from chronic pain and arthritis.           Physical Exam:   Gen: No acute distress  HEENT: [place]/AT,no scleral icterus, mucous membranes moist, oral pharynx clear  Neck: Supple, normal ROM  Lungs: Respirations unlabored, no retractions or use of accessory muscles. No audible cough or  wheezing  Heart: Regular rate and rhythm, normal s1 and s2, no murmurs/rubs/gallops  Abdomen: non tender, non distended, no rebound or guarding, no masses. ABS x 4 quadrants  Extremities: No edema, no discoloration, old AV fistula left forearm. IV right forearm  Musculoskeletal: No joint swelling   Skin: No jaundice or petechiae  Genitourinary: No indwelling foley catheter  Neurologic: Alert and oriented x3, speech clear, no facial droop, no tremor, moves all extremities    In-Home Safety/Stability Assessment    Have you recently had cognitive changes? No  2.   Has the caregiver noticed cognitive changes?  na  3.   Have you had any falls? No  4.   Are you concerned about falling? No  5.   Do you require assistance getting around the house? No  6.   If you use medical equipment, is any of it borrowed/shared with someone in the home? No  7.   Do you use a recliner in your home? No          If no, go to question 8.         If yes answer the following: Do you require assistance to get in or out of the recliner? No          8.   Do you sleep overnight in the recliner? No          9.   Do you require assistance with transfers, ambulation or getting to the bathroom? No                      If yes, who helps? Son lives with her        Does this person live with you? Yes     10. Do you have bladder/bowel urgency or incontinence? No                          If yes, have there been recent changes?             If yes, describe management:   11.  Do you or caregiver feel you are unsteady on your feet? No      12.  Are you concerned about your balance, strength or endurance? No      Additional Concerns:      Medication Administration/Observation and Teaching    For any medication administered confirm 5 rights with virtual visit.: patient, drug, time, dose and route.  Medication teaching as required. Include purpose for medication, precautions and side effects  All medication bottles obtained & med list reviewed with patient/CG including doses, timing, frequency and last dose.  Complete documentation noting discrepancies and other/comments describe discussed goals of care and plan for discharge pending tomorrow labs.     At the conclusion of the in home visit, [facility]) was contacted to set up coordinated virtual visit with attending provider.  The patient schedule was reviewed with patient/CG   Emergency Response Guide (ERG) was reviewed and patient/CG able to teach back    Next NP visit PRN, expected discharge tomorrow based on lab. She otherwise feels very good    Delorse Limber FNP  [place] [facility]

## 2021-11-30 LAB — RENAL FUNCTION PANEL
ALBUMIN: 3.4 g/dL (ref 3.4–5.0)
ANION GAP: 9 mmol/L (ref 5–14)
BLOOD UREA NITROGEN: 19 mg/dL (ref 9–23)
BUN / CREAT RATIO: 13
CALCIUM: 9.7 mg/dL (ref 8.7–10.4)
CHLORIDE: 107 mmol/L (ref 98–107)
CO2: 24 mmol/L (ref 20.0–31.0)
CREATININE: 1.47 mg/dL — ABNORMAL HIGH
EGFR CKD-EPI (2021) FEMALE: 40 mL/min/{1.73_m2} — ABNORMAL LOW (ref >=60)
GLUCOSE RANDOM: 174 mg/dL (ref 70–179)
PHOSPHORUS: 3.5 mg/dL (ref 2.4–5.1)
POTASSIUM: 4.4 mmol/L (ref 3.4–4.8)
SODIUM: 140 mmol/L (ref 135–145)

## 2021-11-30 LAB — MAGNESIUM: MAGNESIUM: 2 mg/dL (ref 1.6–2.6)

## 2021-11-30 LAB — TACROLIMUS LEVEL, TIMED: TACROLIMUS BLOOD: 15.4 ng/mL

## 2021-11-30 MED ADMIN — magnesium sulfate in D5W 1 gram/100 mL infusion 1 g: 1 g | INTRAVENOUS | Stop: 2021-11-30

## 2021-11-30 MED ADMIN — pantoprazole (PROTONIX) EC tablet 20 mg: 20 mg | ORAL | @ 13:00:00 | Stop: 2021-11-30

## 2021-11-30 MED ADMIN — pregabalin (LYRICA) capsule 150 mg: 150 mg | ORAL | @ 01:00:00

## 2021-11-30 MED ADMIN — atorvastatin (LIPITOR) tablet 20 mg: 20 mg | ORAL | @ 13:00:00 | Stop: 2021-11-30

## 2021-11-30 MED ADMIN — magnesium sulfate in D5W 1 gram/100 mL infusion 1 g: 1 g | INTRAVENOUS | @ 14:00:00 | Stop: 2021-11-30

## 2021-11-30 MED ADMIN — sodium bicarbonate tablet 650 mg: 650 mg | ORAL | @ 13:00:00 | Stop: 2021-11-30

## 2021-11-30 MED ADMIN — aspirin chewable tablet 81 mg: 81 mg | ORAL | @ 13:00:00 | Stop: 2021-11-30

## 2021-11-30 MED ADMIN — acetaminophen (TYLENOL) tablet 650 mg: 650 mg | ORAL

## 2021-11-30 MED ADMIN — pregabalin (LYRICA) capsule 75 mg: 75 mg | ORAL | @ 13:00:00 | Stop: 2021-11-30

## 2021-11-30 MED ADMIN — isosorbide mononitrate (IMDUR) 24 hr tablet 30 mg: 30 mg | ORAL | @ 13:00:00 | Stop: 2021-11-30

## 2021-11-30 MED ADMIN — amLODIPine (NORVASC) tablet 10 mg: 10 mg | ORAL | @ 13:00:00 | Stop: 2021-11-30

## 2021-11-30 MED ADMIN — tacrolimus (ENVARSUS XR) extended release tablet 11 mg: 11 mg | ORAL | @ 13:00:00 | Stop: 2021-11-30

## 2021-11-30 MED ADMIN — azaTHIOprine (IMURAN) tablet 100 mg: 100 mg | ORAL | @ 13:00:00 | Stop: 2021-11-30

## 2021-11-30 MED ADMIN — cloNIDine HCL (CATAPRES) tablet 0.2 mg: .2 mg | ORAL | @ 13:00:00 | Stop: 2021-11-30

## 2021-11-30 MED ADMIN — cloNIDine HCL (CATAPRES) tablet 0.2 mg: .2 mg | ORAL | @ 01:00:00

## 2021-11-30 MED ADMIN — sodium bicarbonate tablet 650 mg: 650 mg | ORAL | @ 01:00:00

## 2021-11-30 NOTE — Unmapped (Signed)
Patient alert and oriented and able to voice needs.  Patient continues to deny diarrhea, enteric precautions discontinued.  Continue to trend patient's kidney function labs.  No falls noted.  Vital signs stable.    Problem: Adult Inpatient Plan of Care  Goal: Plan of Care Review  Outcome: Progressing  Goal: Patient-Specific Goal (Individualized)  Outcome: Progressing  Goal: Absence of Hospital-Acquired Illness or Injury  Outcome: Progressing  Goal: Optimal Comfort and Wellbeing  Outcome: Progressing  Goal: Readiness for Transition of Care  Outcome: Progressing  Goal: Rounds/Family Conference  Outcome: Progressing     Problem: Fall Injury Risk  Goal: Absence of Fall and Fall-Related Injury  Outcome: Progressing     Problem: Infection  Goal: Absence of Infection Signs and Symptoms  Outcome: Progressing     Problem: Self-Care Deficit  Goal: Improved Ability to Complete Activities of Daily Living  Outcome: Progressing     Problem: Diabetes Comorbidity  Goal: Blood Glucose Level Within Targeted Range  Outcome: Progressing     Problem: Osteoarthritis Comorbidity  Goal: Maintenance of Osteoarthritis Symptom Control  Outcome: Progressing     Problem: Pain Chronic (Persistent) (Comorbidity Management)  Goal: Acceptable Pain Control and Functional Ability  Outcome: Progressing

## 2021-11-30 NOTE — Unmapped (Signed)
Patient alert and oriented and able to voice needs. Patient continues to deny diarrhea. No falls noted. Vital signs stable.  Patient c/o generalized pain d/t osteoarthritis.     Problem: Adult Inpatient Plan of Care  Goal: Plan of Care Review  11/29/2021 2106 by Marzetta Board, RN  Outcome: Progressing  11/29/2021 1714 by Marzetta Board, RN  Outcome: Progressing  Goal: Patient-Specific Goal (Individualized)  11/29/2021 2106 by Marzetta Board, RN  Outcome: Progressing  11/29/2021 1714 by Marzetta Board, RN  Outcome: Progressing  Goal: Absence of Hospital-Acquired Illness or Injury  11/29/2021 2106 by Marzetta Board, RN  Outcome: Progressing  11/29/2021 1714 by Marzetta Board, RN  Outcome: Progressing  Goal: Optimal Comfort and Wellbeing  11/29/2021 2106 by Marzetta Board, RN  Outcome: Progressing  11/29/2021 1714 by Marzetta Board, RN  Outcome: Progressing  Goal: Readiness for Transition of Care  11/29/2021 2106 by Marzetta Board, RN  Outcome: Progressing  11/29/2021 1714 by Marzetta Board, RN  Outcome: Progressing  Goal: Rounds/Family Conference  11/29/2021 2106 by Marzetta Board, RN  Outcome: Progressing  11/29/2021 1714 by Marzetta Board, RN  Outcome: Progressing     Problem: Fall Injury Risk  Goal: Absence of Fall and Fall-Related Injury  11/29/2021 2106 by Marzetta Board, RN  Outcome: Progressing  11/29/2021 1714 by Marzetta Board, RN  Outcome: Progressing     Problem: Infection  Goal: Absence of Infection Signs and Symptoms  11/29/2021 2106 by Marzetta Board, RN  Outcome: Progressing  11/29/2021 1714 by Marzetta Board, RN  Outcome: Progressing     Problem: Self-Care Deficit  Goal: Improved Ability to Complete Activities of Daily Living  11/29/2021 2106 by Marzetta Board, RN  Outcome: Progressing  11/29/2021 1714 by Marzetta Board, RN  Outcome: Progressing     Problem: Diabetes Comorbidity  Goal: Blood Glucose Level Within Targeted Range  11/29/2021 2106 by Marzetta Board, RN  Outcome: Progressing  11/29/2021 1714 by Marzetta Board, RN  Outcome: Progressing     Problem: Osteoarthritis Comorbidity  Goal: Maintenance of Osteoarthritis Symptom Control  11/29/2021 2106 by Marzetta Board, RN  Outcome: Progressing  11/29/2021 1714 by Marzetta Board, RN  Outcome: Progressing     Problem: Pain Chronic (Persistent) (Comorbidity Management)  Goal: Acceptable Pain Control and Functional Ability  11/29/2021 2106 by Marzetta Board, RN  Outcome: Progressing  11/29/2021 1714 by Marzetta Board, RN  Outcome: Progressing

## 2021-12-01 MED ORDER — AZATHIOPRINE 50 MG TABLET
ORAL_TABLET | Freq: Every day | ORAL | 1 refills | 90 days | Status: CP
Start: 2021-12-01 — End: ?

## 2021-12-01 NOTE — Unmapped (Signed)
Physician Discharge Summary [facility]  [facility] [place]  [address]  [place] [address]  Dept: [phone]  Loc: [phone]     Identifying Information:   Michele Cisneros  [birth year]  [id number]    Primary Care Physician: Donnald Garre, MD   Code Status: DNR and DNI    Admit Date: 11/27/2021    Discharge Date: 11/30/2021     Discharge To: Home    Discharge Service: [facility]     Discharge Attending Physician: Para March, MD    Discharge Diagnoses:  Principal Problem:    AKI (acute kidney injury) (CMS-HCC) POA: Yes  Active Problems:    Status post deceased-donor kidney transplantation POA: Not Applicable    Diabetes mellitus, type II (CMS-HCC) POA: Yes    Essential hypertension POA: Yes    Hyperlipidemia POA: Yes    Anemia in chronic kidney disease (CKD) POA: Yes    COPD (chronic obstructive pulmonary disease) (CMS-HCC) POA: Yes    Coronary artery disease of native artery of native heart with stable angina pectoris (CMS-HCC) POA: Yes    Diabetic neuropathy (CMS-HCC) POA: Yes    PAD (peripheral artery disease) (CMS-HCC) POA: Yes    Valvular heart disease POA: Yes    Immunocompromised patient (CMS-HCC) POA: Yes    GERD (gastroesophageal reflux disease) POA: Yes    Hypomagnesemia POA: Yes    Tobacco use disorder POA: Yes  Resolved Problems:    * No resolved hospital problems. *      Outpatient Provider Follow Up Issues:   Follow up renal panel    Hospital Course:   Patient with a history diabetic nephropathy s/p renal transplant presented after labs noted AKI w/ creatinine elevated to 2.36 from baseline of 0.9 to 1.4. Suspected to be 2/2 hypovolemia from month-long history of diarrhea. Work up revealed no evidence of rejection. Suspected diarrhea 2/2 Myfortic, which was switched out in favor of Azathioprine. Diarrhea resolved and did not have any diarrhea during her hospitalization. Given 1L NS. Patient's renal fxn improved to 1.47 on date of discharge. Will continue to hold her home Metformin/Lasix for two more days. Discussed w/ nephro who will arrange follow up labs at the transplant clinic.    Procedures:  None  No admission procedures for hospital encounter.  ______________________________________________________________________  Discharge Medications:     Medication List      START taking these medications     azaTHIOprine 50 mg tablet; Commonly known as: IMURAN; Take 2 tablets   (100 mg total) by mouth daily.; Start taking on: December 01, 2021     CONTINUE taking these medications     acetaminophen 500 MG tablet; Commonly known as: TYLENOL; Take 1-2   tablets (500-1,000 mg total) by mouth every six (6) hours as needed for   pain or fever (> 38C or 100.40F).   albuterol 90 mcg/actuation inhaler; Commonly known as: PROVENTIL   HFA;VENTOLIN HFA   amLODIPine 10 MG tablet; Commonly known as: NORVASC; Take 1 tablet (10   mg total) by mouth daily.   aspirin 81 MG tablet; Commonly known as: ECOTRIN   atorvastatin 20 MG tablet; Commonly known as: LIPITOR; Take 1 tablet (20   mg total) by mouth daily.   blood-glucose meter kit   cloNIDine HCL 0.1 MG tablet; Commonly known as: CATAPRES; Take 2 tablets   (0.2 mg total) by mouth Two (2) times a day.   CONTOUR TEST STRIPS Strp; Generic drug: blood  sugar diagnostic; by Other   route two (2) times a day. TEST BLOOD SUGARS 2 TIMES DAILY   * ENVARSUS XR 1 mg Tb24 extended release tablet; Generic drug:   tacrolimus; Take 3 (1mg ) tablet with 2 (4mg ) tablets every morning to   equal 11mg  daily dose   * ENVARSUS XR 4 mg Tb24 extended release tablet; Generic drug:   tacrolimus; Take 2 tablets (8 mg total) by mouth daily. Take 2 (4mg )   tablets with 3 (1mg ) tablet every morning to equal 11mg  daily dose   ergocalciferol-1,250 mcg (50,000 unit) 1,250 mcg (50,000 unit) capsule;   Commonly known as: VITAMIN D2-1,250 mcg (50,000 unit); Take 1 capsule   (1,250 mcg total) by mouth once a week. Take for 8 weeks as directed.   esomeprazole 20 MG capsule; Commonly known as: NexIUM; Take 1 capsule   (20 mg total) by mouth daily.   ferrous sulfate 325 (65 FE) MG EC tablet; Take 1 tablet (325 mg total)   by mouth every other day.   furosemide 40 MG tablet; Commonly known as: LASIX; Take 1 tablet (40 mg   total) by mouth Two (2) times a day.   isosorbide mononitrate 30 MG 24 hr tablet; Commonly known as: IMDUR   LANCETS,THIN MISC   LOKELMA 10 gram Pwpk packet; Generic drug: sodium zirconium   cyclosilicate; Take 1 packet (10 g total) by mouth daily as needed.   metFORMIN 500 MG tablet; Commonly known as: GLUCOPHAGE; Take 1 tablet   (500 mg total) by mouth in the morning and 1 tablet (500 mg total) in the   evening. Take with meals.   nitroglycerin 0.4 MG SL tablet; Commonly known as: NITROSTAT   pregabalin 75 MG capsule; Commonly known as: LYRICA; Take 1 capsule (75   mg total) by mouth every morning AND 2 capsules (150 mg total) nightly.   sodium bicarbonate 650 mg tablet; Take 1 tablet (650 mg total) by mouth   Two (2) times a day.  * This list has 2 medication(s) that are the same as other medications   prescribed for you. Read the directions carefully, and ask your doctor or   other care provider to review them with you.     STOP taking these medications     docusate sodium 100 MG capsule; Commonly known as: COLACE   hydrALAZINE 50 MG tablet; Commonly known as: APRESOLINE   lidocaine 5 % patch; Commonly known as: LIDODERM   mycophenolate 180 MG EC tablet; Commonly known as: MYFORTIC   polyethylene glycol 17 gram packet; Commonly known as: MIRALAX       Allergies:  Hydrocodone  ______________________________________________________________________  Pending Test Results (if blank, then none):  Pending Labs       Order Current Status    GI Pathogen Panel (instead of Stool O&P) Collected (11/27/21 2009)    EBV QUANTITATIVE PCR, BLOOD In process    Thiopurine Methyltransferase (TPMT) In process            Most Recent Labs:  All lab results last 24 hours - Recent Results (from the past 24 hour(s))   Magnesium Level    Collection Time: 11/30/21 10:06 AM   Result Value Ref Range    Magnesium 2.0 1.6 - 2.6 mg/dL   Renal Function Panel    Collection Time: 11/30/21 10:06 AM   Result Value Ref Range    Sodium 140 135 - 145 mmol/L    Potassium 4.4 3.4 - 4.8 mmol/L  Chloride 107 98 - 107 mmol/L    CO2 24.0 20.0 - 31.0 mmol/L    Anion Gap 9 5 - 14 mmol/L    BUN 19 9 - 23 mg/dL    Creatinine 0.98 (H) 0.60 - 0.80 mg/dL    BUN/Creatinine Ratio 13     eGFR CKD-EPI (2021) Female 40 (L) >=60 mL/min/1.49m2    Glucose 174 70 - 179 mg/dL    Calcium 9.7 8.7 - 11.9 mg/dL    Phosphorus 3.5 2.4 - 5.1 mg/dL    Albumin 3.4 3.4 - 5.0 g/dL   Tacrolimus Level, Timed    Collection Time: 11/30/21 10:06 AM   Result Value Ref Range    Tacrolimus, Timed 15.4 ng/mL       Relevant Studies/Radiology (if blank, then none):  US Renal Transplant W Doppler    Result Date: 11/27/2021  EXAM: US RENAL TRANSPLANT W DOPPLER DATE: 11/27/2021 1:59 PM ACCESSION: [id number] UN DICTATED: 11/27/2021 1:59 PM INTERPRETATION LOCATION: [facility] CLINICAL INDICATION: 66 years old Female with elevated creatinine in transplant kidney COMPARISON: 06/15/2021 and 07/20/2019. TECHNIQUE:  Ultrasound views of the renal transplant were obtained using gray scale and color and spectral Doppler imaging. Views of the urinary bladder were obtained using gray scale and limited color Doppler imaging. FINDINGS: TRANSPLANTED KIDNEY: The renal transplant was located in the left  lower quadrant. Normal size and echogenicity.  No solid masses or calculi. No perinephric collections identified. No hydronephrosis. Renal Transplant: Sagittal 11.6 cm; AP 3.8 cm; Transverse 4.3 cm VESSELS: - Perfusion: Using power Doppler, normal perfusion was seen throughout the renal parenchyma. - Resistive indices in the renal transplant are stable compared with prior examination. - Main renal artery/iliac artery: Patent - Main renal vein/iliac vein: Patent Please see below for data measurements: Segmental artery superior resistive index: 1.0 Segmental artery mid resistive index: 1.0 Segmental artery inferior resistive index: 1.0 Previous resistive indices range of segmental arteries: 1.0-1.0 Main renal artery peak systolic velocity at anastomosis: 1.76 m/s Main renal artery hilum resistive index: 1.0 Main renal artery mid resistive index: 0.92 Main renal artery anastomosis resistive index: 1.0 Previous resistive indices range of main renal artery: 0.93-1.0 Main renal vein: patent Iliac artery: Patent Iliac vein: Patent BLADDER: Underdistended limiting evaluation. Bladder volume prevoid: 38  mL     No significant change compared with prior study. Stable, elevated resistive indices in the renal transplant arteries   ______________________________________________________________________  Discharge Instructions:                   Appointments which have been scheduled for you      Dec 20, 2021 11:00 AM  (Arrive by 10:45 AM)  RETURN NEPHROLOGY POST with Randal Ewing Schlein, MD  [facility] TRANSPLANT [place] [facility]) [address]  [place] [address]  [phone]             ______________________________________________________________________  Discharge Day Services:  BP 187/80  - Pulse 61  - Temp 36.7 ??C (98.1 ??F) (Oral)  - Resp 18  - Ht 170.2 cm (5' 7)  - Wt 64.9 kg (143 lb 1.3 oz)  - SpO2 99%  - BMI 22.41 kg/m??   Patient seen on the day of discharge via virtual video visit and was deemed appropriate for discharge.    Condition at Discharge: good    Length of Discharge: I spent greater than 30 mins in the discharge of this patient.    The patient and/or parent/guardian  has been advised of the potential risks and limitations of this mode of treatment (including, but not limited to, the absence of in-person examination) and has agreed to be treated using telemedicine. The  Patient's, patient's guardian's and/or patient's family's questions regarding telemedicine have been answered.

## 2021-12-01 NOTE — Unmapped (Signed)
Patient alert and oriented and able to voice needs. No falls noted and no skin breakdown noted.  Patient has not had any reoccurrence of diarrhea and Cr labs have returned to with baseline.  Patient to be discharged to care of self to home with follow up with nephrology .   MD discussed medication changes and patient verbalized understanding.      Problem: Adult Inpatient Plan of Care  Goal: Plan of Care Review  Outcome: Resolved  Goal: Patient-Specific Goal (Individualized)  Outcome: Resolved  Goal: Absence of Hospital-Acquired Illness or Injury  Outcome: Resolved  Goal: Optimal Comfort and Wellbeing  Outcome: Resolved  Goal: Readiness for Transition of Care  Outcome: Resolved  Goal: Rounds/Family Conference  Outcome: Resolved     Problem: Fall Injury Risk  Goal: Absence of Fall and Fall-Related Injury  Outcome: Resolved     Problem: Infection  Goal: Absence of Infection Signs and Symptoms  Outcome: Resolved     Problem: Self-Care Deficit  Goal: Improved Ability to Complete Activities of Daily Living  Outcome: Resolved     Problem: Diabetes Comorbidity  Goal: Blood Glucose Level Within Targeted Range  Outcome: Resolved     Problem: Osteoarthritis Comorbidity  Goal: Maintenance of Osteoarthritis Symptom Control  Outcome: Resolved     Problem: Pain Chronic (Persistent) (Comorbidity Management)  Goal: Acceptable Pain Control and Functional Ability  Outcome: Resolved

## 2021-12-02 ENCOUNTER — Telehealth: Payer: Self-pay | Admitting: Internal Medicine

## 2021-12-02 DIAGNOSIS — Z940 Kidney transplant status: Principal | ICD-10-CM

## 2021-12-02 LAB — EBV QUANTITATIVE PCR, BLOOD: EBV VIRAL LOAD RESULT: NOT DETECTED

## 2021-12-02 NOTE — Unmapped (Signed)
Called patient to check in. Reports she is doing well at home. Excited her labs are back to normal. She will repeat labs tomorrow at [facility].    Denies any other needs

## 2021-12-03 ENCOUNTER — Telehealth: Payer: Self-pay

## 2021-12-03 NOTE — Telephone Encounter (Signed)
Patient declined hfu with pcp at this time. Notes hfu is scheduled and to be completed with [facility]. Agrees to call the office as needed for follow up.

## 2021-12-05 LAB — THIOPURINE METHYLTRANFERASE
6-METHYLMERCAPTOPURINE RIBOSIDE: 8.56 nmol/mL/h
6-METHYLMERCAPTOPURINE: 5.44 nmol/mL/h
6-METHYLTHIOGUANINE RIBOSIDE: 4.47 nmol/mL/h

## 2021-12-05 MED ORDER — ERGOCALCIFEROL (VITAMIN D2) 1,250 MCG (50,000 UNIT) CAPSULE
ORAL_CAPSULE | ORAL | 2 refills | 28 days
Start: 2021-12-05 — End: 2022-12-05

## 2021-12-05 NOTE — Unmapped (Signed)
The patient is requesting a medication refill

## 2021-12-09 DIAGNOSIS — N186 End stage renal disease: Principal | ICD-10-CM

## 2021-12-09 DIAGNOSIS — Z940 Kidney transplant status: Principal | ICD-10-CM

## 2021-12-09 MED FILL — AMLODIPINE 10 MG TABLET: ORAL | 30 days supply | Qty: 30 | Fill #3

## 2021-12-09 MED FILL — SODIUM BICARBONATE 650 MG TABLET: ORAL | 30 days supply | Qty: 60 | Fill #8

## 2021-12-09 MED FILL — PREGABALIN 75 MG CAPSULE: ORAL | 30 days supply | Qty: 90 | Fill #2

## 2021-12-09 NOTE — Unmapped (Signed)
Called and left patient VM regarding repeat labs.  Asked her to get labs if she has not or if there was an issue getting labs to let me know

## 2021-12-13 ENCOUNTER — Ambulatory Visit (INDEPENDENT_AMBULATORY_CARE_PROVIDER_SITE_OTHER): Payer: Medicare Other

## 2021-12-13 VITALS — Ht 67.5 in | Wt 151.0 lb

## 2021-12-13 DIAGNOSIS — Z0000 Encounter for general adult medical examination without abnormal findings: Secondary | ICD-10-CM | POA: Diagnosis not present

## 2021-12-13 NOTE — Progress Notes (Signed)
Subjective:   Theresa [name] is a 66 y.o. female who presents for Medicare Annual (Subsequent) preventive examination.  Review of Systems    No ROS.  Medicare Wellness Virtual Visit.  Visual/audio telehealth visit, UTA vital signs.   See social history for additional risk factors.   Cardiac Risk Factors include: advanced age (>39mn, >>5women);hypertension;diabetes mellitus     Objective:    Today's Vitals   12/13/21 1007  Weight: 151 lb (68.5 kg)  Height: 5' 7.5" (1.715 m)   Body mass index is 23.3 kg/m.     12/13/2021   10:28 AM 09/06/2021   12:35 PM 06/07/2021    3:06 PM 05/28/2021    7:55 AM 05/18/2021    7:20 PM 12/03/2020    1:43 PM 04/18/2020    6:18 PM  Advanced Directives  Does Patient Have a Medical Advance Directive? No No No No No Yes No  Type of Advance Directive      [place]  Does patient want to make changes to medical advance directive?      No - Patient declined   Copy of [place] Chart?      No - copy requested   Would patient like information on creating a medical advance directive? No - Patient declined No - Patient declined  No - Patient declined No - Patient declined  No - Guardian declined    Current Medications (verified) Outpatient Encounter Medications as of 12/13/2021  Medication Sig   acetaminophen (TYLENOL) 500 MG tablet Take 1,000 mg by mouth daily as needed.   acetaminophen (TYLENOL) 500 MG tablet Take by mouth.   albuterol (PROVENTIL) (2.5 MG/3ML) 0.083% nebulizer solution Take 3 mLs (2.5 mg total) by nebulization every 6 (six) hours as needed for wheezing or shortness of breath.   albuterol (VENTOLIN HFA) 108 (90 Base) MCG/ACT inhaler Inhale 1-2 puffs into the lungs every 6 (six) hours as needed for wheezing or shortness of breath.   amLODipine (NORVASC) 10 MG tablet Take 1 tablet (10 mg total) by mouth daily.   aspirin EC 81 MG tablet Take 81 mg by mouth daily. Swallow whole.   atorvastatin  (LIPITOR) 20 MG tablet Take 1 tablet (20 mg total) by mouth daily.   cloNIDine (CATAPRES) 0.1 MG tablet Take 1 tablet (0.1 mg total) by mouth 2 (two) times daily. May take an extra 0.1 mg clonidine for blood pressure >160   ENVARSUS XR 1 MG TB24 Take 1 mg by mouth daily as needed. Total dose is 9 mg every morning at 0900.   esomeprazole (NEXIUM) 20 MG capsule Take 20 mg by mouth daily.   ferrous sulfate 325 (65 FE) MG EC tablet Take 1 tablet (325 mg total) by mouth 3 (three) times daily with meals.   furosemide (LASIX) 40 MG tablet Take 1 tablet (40 mg) by mouth once daily, you may take 1 extra tablet (40 mg) once daily at lunch as needed for weight gain/ swelling   isosorbide mononitrate (IMDUR) 30 MG 24 hr tablet Take 1 tablet (30 mg total) by mouth daily.   lidocaine (LIDODERM) 5 % Place onto the skin.   loratadine (CLARITIN) 10 MG tablet Take 10 mg by mouth daily.   magnesium oxide (MAG-OX) 400 MG tablet Take 400 mg by mouth 2 (two) times daily.   metFORMIN (GLUCOPHAGE) 500 MG tablet Take by mouth.   mycophenolate (MYFORTIC) 180 MG EC tablet Take by mouth.   nitroGLYCERIN (NITROSTAT)  0.4 MG SL tablet Place 1 tablet (0.4 mg total) under the tongue every 5 (five) minutes as needed for chest pain.   nitroGLYCERIN (NITROSTAT) 0.4 MG SL tablet Place under the tongue.   pregabalin (LYRICA) 75 MG capsule Take 1 capsule (75 mg total) by mouth 2 (two) times daily.   sodium bicarbonate 650 MG tablet Take 650 mg by mouth 2 (two) times daily.   sodium chloride (OCEAN) 0.65 % SOLN nasal spray Place 2 sprays into both nostrils as needed for congestion.   Sodium Zirconium Cyclosilicate (LOKELMA PO) Take by mouth in the morning and at bedtime.   tacrolimus ER (ENVARSUS XR) 1 MG TB24 Take 3 mg by mouth daily. (Patient not taking: Reported on 10/14/2021)   tacrolimus ER (ENVARSUS XR) 4 MG TB24 Take 8 mg by mouth daily before breakfast. Total dose is 9 mg daily in the morning. (Patient not taking: Reported on  10/14/2021)   tacrolimus ER (ENVARSUS XR) 4 MG TB24 Take 8 mg by mouth daily.   Vitamin D, Ergocalciferol, (DRISDOL) 1.25 MG (50000 UNIT) CAPS capsule Take 50,000 Units by mouth every 7 (seven) days. Mondays   No facility-administered encounter medications on file as of 12/13/2021.    Allergies (verified) Hydrocodone and Lisinopril   History: Past Medical History:  Diagnosis Date   Anemia of chronic disease    Anginal pain ([place])    Carotid arterial disease ([place])    a. 02/2013 U/S: 40-59% bilat ICA stenosis.   Chronic constipation    Chronic diastolic CHF (congestive heart failure) ([place])    a. 10/2013 Echo [facility]): EF 55-60%, mod conc LVH, mod MR, mildly dil LA, mild Ao sclerosis w/o stenosis; b. 02/2016 Echo: EF 55-60%, no rwma, Gr DD, mild AS, mod to sev MR, mildly dil LA, PASP 82mHg; c. 07/2017 Echo ([facility]: EF >55%, mild to mod LVH, Gr2 DD, Ao scl, Mod MR, mod dil LA, nl RV fxn.   Colon polyps    COPD (chronic obstructive pulmonary disease) (HCC)    Coronary artery disease    a. 05/2013 NSTEMI/PCI: LM 20d, LAD min irregs, LCX small, nl, OM1 nl, RCA dom 958m2.5x16 Promus DES), PDA1 80p; b. 04/2015 Cath: LM nl, LAD 3069m1/2 min irregs, LCX 35p/m, OM2/3 min irregs, RCA patent mid stent, RPDA 70ost, RPLB1 30, RPLB2/3 min irregs, EF 55-65%--> Med Rx; c. [facility]2017 [facility] EF 64%.   COVID-19    06/2021   Diabetes mellitus    Diabetic neuropathy ([place]  Diabetic retinopathy ([place]CC[place]2/20/2014   Hx bilat retinal detachment, proliferative diab retinopathy and bilat vitreous hemorrhage    Emphysema    ESRD on hemodialysis ([place]  [place] [facility] [place] [phone]/Dr. [name], on a MWF schedule.  She started dialysis in Feb 2014.  Etiology of renal failure not known, likely diabetes.  Has a left upper arm AV graft.   History of bronchitis    Mar 2012   History of pneumonia    June 2012   History of tobacco abuse    a. Quit 2012.   Hyperlipidemia    Hypertension     Moderate to severe mitral insufficiency    a. 10/2013 Echo: EF 55-60%, mod MR; b. 02/2016 Echo: EF 55-60%, mod to sev MR directed posteriorly--felt to be dynamic -worse with volume overload; c. [facility]2017 [facility] Mod MR.   Myocardial infarct (HCCEsp[place]2/2014   Peripheral vascular disease (HCC)    Sickle cell  trait ([employer])    Thyroid nodule    [place] 06/2017 due to f/u US in 1 year    Valvular heart disease    echo 06/17/21 mild to moderate   Past Surgical History:  Procedure Laterality Date   A/V FISTULAGRAM Left 11/10/2017   Procedure: A/V FISTULAGRAM;  Surgeon: Katha Cabal, MD;  Location: [place] CV LAB;  Service: Cardiovascular;  Laterality: Left;   A/V SHUNTOGRAM Left 02/08/2019   Procedure: A/V SHUNTOGRAM;  Surgeon: Katha Cabal, MD;  Location: [place] CV LAB;  Service: Cardiovascular;  Laterality: Left;   ABDOMINAL HYSTERECTOMY     2000 for cysts on ovaries total no abnormal pap   CARDIAC CATHETERIZATION     CARDIAC CATHETERIZATION N/A 05/02/2015   Procedure: Left Heart Cath and Coronary Angiography;  Surgeon: [name], MD;  Location: [place] CV LAB;  Service: Cardiovascular;  Laterality: N/A;   COLONOSCOPY WITH PROPOFOL N/A 07/08/2016   Procedure: COLONOSCOPY WITH PROPOFOL;  Surgeon: [name], MD;  Location: [facility] ENDOSCOPY;  Service: Endoscopy;  Laterality: N/A;   COLONOSCOPY WITH PROPOFOL N/A 08/20/2017   Procedure: COLONOSCOPY WITH PROPOFOL;  Surgeon: [name], MD;  Location: [facility] ENDOSCOPY;  Service: Gastroenterology;  Laterality: N/A;   COLONOSCOPY WITH PROPOFOL N/A 09/06/2021   Procedure: COLONOSCOPY WITH PROPOFOL;  Surgeon: [name], MD;  Location: [facility] ENDOSCOPY;  Service: Gastroenterology;  Laterality: N/A;   colonscopy     CORONARY ANGIOPLASTY  05/28/2014   stent placement to the mid RCA   CYSTOURETHROSCOPY     [facility] urology with stent placement 11 or 05/2019 and stent removal 06/13/19    DILATION AND CURETTAGE OF UTERUS      several in the early 80's   ESOPHAGOGASTRODUODENOSCOPY     2012   ESOPHAGOGASTRODUODENOSCOPY N/A 09/06/2021   Procedure: ESOPHAGOGASTRODUODENOSCOPY (EGD);  Surgeon: [name], MD;  Location: [facility] ENDOSCOPY;  Service: Gastroenterology;  Laterality: N/A;   ESOPHAGOGASTRODUODENOSCOPY (EGD) WITH PROPOFOL N/A 03/11/2018   Procedure: ESOPHAGOGASTRODUODENOSCOPY (EGD) WITH PROPOFOL;  Surgeon: [name], MD;  Location: [facility] ENDOSCOPY;  Service: Gastroenterology;  Laterality: N/A;   EYE SURGERY     bilateral laser 2012   EYE SURGERY     right   EYE SURGERY     x4 both eyes   GAS INSERTION  09/30/2011   Procedure: INSERTION OF GAS;  Surgeon: [name], MD;  Location: [place];  Service: Ophthalmology;  Laterality: Right;  [id number]   GAS/FLUID EXCHANGE  09/30/2011   Procedure: GAS/FLUID EXCHANGE;  Surgeon: [name], MD;  Location: [place];  Service: Ophthalmology;  Laterality: Right;   KIDNEY TRANSPLANT     03/30/19 Dr. Mickel Baas Thomas/Dr. Cristie Hem Zendel/Dr. Gilford Raid; [facility] transplant Dr. Horald Chestnut will be primary [phone] coordinator Sarah Wynkoop [phone]   KIDNEY TRANSPLANT     03/30/19 [facility]   LEFT HEART CATHETERIZATION WITH CORONARY ANGIOGRAM N/A 05/28/2013   Procedure: LEFT HEART CATHETERIZATION WITH CORONARY ANGIOGRAM;  Surgeon: [name], MD;  Location: [facility] CATH LAB;  Service: Cardiovascular;  Laterality: N/A;   PARS PLANA VITRECTOMY  04/22/2011   Procedure: PARS PLANA VITRECTOMY WITH 25 GAUGE;  Surgeon: [name], MD;  Location: [place];  Service: Ophthalmology;  Laterality: Left;  membrane peel, endolaser, gas fluid exchange, silicone oil, repair of complex traction retinal detachment   PARS PLANA VITRECTOMY  09/30/2011   Procedure: PARS PLANA VITRECTOMY WITH 25 GAUGE;  Surgeon: [name], MD;  Location: [place];  Service: Ophthalmology;  Laterality: Right;  Endolaser; Repair of Complex Traction Retinal Detachment   PARS PLANA VITRECTOMY  02/24/2012   Procedure:  PARS PLANA VITRECTOMY WITH 25 GAUGE;  Surgeon: [name], MD;  Location: [place];  Service: Ophthalmology;  Laterality: Left;   PTCA     SILICON OIL REMOVAL  40/98/1191   Procedure: SILICON OIL REMOVAL;  Surgeon: [name], MD;  Location: [place];  Service: Ophthalmology;  Laterality: Left;   THROMBECTOMY / ARTERIOVENOUS GRAFT REVISION     TUBAL LIGATION     1979   UPPER EXTREMITY ANGIOGRAPHY Left 02/08/2019   Procedure: UPPER EXTREMITY ANGIOGRAPHY;  Surgeon: Katha Cabal, MD;  Location: [place] CV LAB;  Service: Cardiovascular;  Laterality: Left;   Family History  Problem Relation Age of Onset   Stroke Mother    Heart attack Mother    Heart disease Mother    Colon cancer Father    Colon cancer Sister    Breast cancer Sister    Heart attack Brother    Stroke Brother    Diabetes Brother    Diabetes Brother    Diabetes Daughter    Renal Disease Daughter        on HD   Kidney failure Daughter        age 14 as of 12/03/20   Anesthesia problems Neg Hx    Hypotension Neg Hx    Malignant hyperthermia Neg Hx    Pseudochol deficiency Neg Hx    Social History   Socioeconomic History   Marital status: Single    Spouse name: Not on file   Number of children: 2   Years of education: Not on file   Highest education level: Not on file  Occupational History   Not on file  Tobacco Use   Smoking status: Former    Packs/day: 1.00    Years: 25.00    Total pack years: 25.00    Types: Cigarettes    Quit date: 03/30/2019    Years since quitting: 2.7   Smokeless tobacco: Never  Vaping Use   Vaping Use: Never used  Substance and Sexual Activity   Alcohol use: No   Drug use: Yes    Types: Marijuana   Sexual activity: Never  Other Topics Concern   Not on file  Social History Narrative   On disability.    Lives with son Brenton Grills   2 children (has son and daughter)      Pandora Leiter drives her since her vision has decreased   Social Determinants of Health   Financial  Resource Strain: Low Risk  (12/13/2021)   Overall Financial Resource Strain (CARDIA)    Difficulty of Paying Living Expenses: Not hard at all  Food Insecurity: No Food Insecurity (12/13/2021)   Hunger Vital Sign    Worried About Running Out of Food in the Last Year: Never true    [place] in the Last Year: Never true  Transportation Needs: No Transportation Needs (12/13/2021)   PRAPARE - Hydrologist (Medical): No    Lack of Transportation (Non-Medical): No  Physical Activity: Insufficiently Active (12/13/2021)   Exercise Vital Sign    Days of Exercise per Week: 7 days    Minutes of Exercise per Session: 10 min  Stress: No Stress Concern Present (12/13/2021)   Glide    Feeling of Stress : Not at all  Social  Connections: Unknown (12/13/2021)   Social Connection and Isolation Panel [NHANES]    Frequency of Communication with Friends and Family: More than three times a week    Frequency of Social Gatherings with Friends and Family: More than three times a week    Attends Religious Services: Not on Advertising copywriter or Organizations: Not on file    Attends Archivist Meetings: Not on file    Marital Status: Not on file    Tobacco Counseling Counseling given: Not Answered   Clinical Intake:  Pre-visit preparation completed: Yes        Diabetes: Yes (Followed by PCP)  How often do you need to have someone help you when you read instructions, pamphlets, or other written materials from your doctor or pharmacy?: 1 - Never Interpreter Needed?: No      Activities of Daily Living    12/13/2021   10:09 AM 05/18/2021    7:20 PM  In your present state of health, do you have any difficulty performing the following activities:  Hearing? 0 0  Vision? 1 0  Difficulty concentrating or making decisions? 1 0  Walking or climbing stairs? 1 0  Comment Cane in use when  ambulating   Dressing or bathing? 0 0  Doing errands, shopping? 1 0  Comment Family Diplomatic Services operational officer and eating ? Y   Comment Son assist meal prep. Self feeds.   Using the Toilet? N   In the past six months, have you accidently leaked urine? N   Do you have problems with loss of bowel control? N   Managing your Finances? Y   Comment Son TEFL teacher or managing your Housekeeping? Y   Comment Son assist     Patient Care Team: McLean-Scocuzza, Nino Glow, MD as PCP - General (Internal Medicine) Minna Merritts, MD as PCP - Cardiology (Cardiology) Alisa Graff, FNP as Nurse Practitioner (Family Medicine) Minna Merritts, MD as Consulting Physician (Cardiology) Anthonette Legato, MD as Consulting Physician (Nephrology)  Indicate any recent Medical Services you may have received from other than [facility] providers in the past year (date may be approximate).     Assessment:   This is a routine wellness examination for [place].  Virtual Visit via Telephone Note  I connected with  Theresa [name] on 12/13/21 at 10:00 AM EDT by telephone and verified that I am speaking with the correct person using two identifiers.  Persons participating in the virtual visit: patient/Nurse Health Advisor   I discussed the limitations of performing an evaluation and management service by telehealth. We continued and completed visit with audio only. Some vital signs may be absent or patient reported.   Hearing/Vision screen Hearing Screening - Comments:: Patient is able to hear conversational tones without difficulty. No issues reported.  Vision Screening - Comments:: Followed by Dr. Marvel Plan Wears corrective lenses Exams completed every 6 months  Cataract extraction, bilateral  They have regular follow up with the ophthalmologist  Dietary issues and exercise activities discussed: Current Exercise Habits: Home exercise routine, Type of exercise: stretching, Time (Minutes): 10, Frequency  (Times/Week): 7, Weekly Exercise (Minutes/Week): 70, Intensity: Mild Regular diet   Goals Addressed             This Visit's Progress    Healthy Lifestyle       Stay active. Stay hydrated. Healthy diet.       Depression Screen    12/13/2021  10:22 AM 12/03/2020    1:42 PM 12/01/2019    1:10 PM 06/16/2019    2:13 PM 03/23/2018    4:34 PM 01/05/2018   10:58 AM 12/29/2017   11:40 AM  PHQ 2/9 Scores  PHQ - 2 Score 0 0 0 0 0 0 0    Fall Risk    12/13/2021   10:29 AM 04/05/2021   11:13 AM 12/04/2020   10:13 AM 12/03/2020    1:47 PM 05/29/2020   12:29 PM  Fall Risk   Falls in the past year? 0 0 1 1 0  Number falls in past yr: 0 0 0 0 0  Injury with Fall?  0 0 0 0  Risk for fall due to : Impaired vision No Fall Risks  Impaired balance/gait   Follow up Falls evaluation completed Falls evaluation completed Falls evaluation completed Falls evaluation completed Falls evaluation completed    [place]: Home free of loose throw rugs in walkways, pet beds, electrical cords, etc? Yes  Adequate lighting in your home to reduce risk of falls? Yes   ASSISTIVE DEVICES UTILIZED TO PREVENT FALLS: Life alert? No  Use of a cane, walker or w/c? Yes   TIMED UP AND GO: Was the test performed? No .    Cognitive Function:  Patient is alert and oriented x3.       12/03/2020    1:50 PM 12/01/2019    1:14 PM 03/04/2018    1:03 PM 02/24/2017   12:00 PM  6CIT Screen  What Year? 0 points 0 points 0 points 0 points  What month? 0 points 0 points 0 points 0 points  What time? 0 points  0 points 0 points  Count back from 20 0 points  0 points 0 points  Months in reverse 0 points 0 points 0 points 0 points  Repeat phrase  0 points 0 points 0 points  Total Score   0 points 0 points    Immunizations Immunization History  Administered Date(s) Administered   Hepatitis B 12/16/2012, 01/15/2013, 03/05/2013, 06/18/2013   Hepatitis B, adult 12/16/2012, 01/15/2013,  03/05/2013, 06/18/2013   Influenza Nasal 03/06/2015   Influenza Split 02/25/2012, 02/07/2013, 02/19/2015, 03/06/2015   Influenza, High Dose Seasonal PF 02/09/2018   Influenza,inj,Quad PF,6+ Mos 02/21/2014, 02/24/2017, 04/04/2020, 04/11/2021   Influenza-Unspecified 02/25/2012, 03/01/2018, 03/18/2019, 04/04/2020   PFIZER(Purple Top)SARS-COV-2 Vaccination 02/11/2020, 03/03/2020, 09/12/2020, 01/10/2021   PPD Test 07/16/2012, 11/12/2012, 01/17/2015, 05/12/2016, 05/13/2017, 05/12/2018   Pfizer Covid-19 Vaccine Bivalent Booster 78yr & up 04/26/2021   Pneumococcal Conjugate-13 05/15/2020   Pneumococcal Polysaccharide-23 06/09/2010, 03/23/2018   Pneumococcal-Unspecified 06/09/2010, 07/04/2015, 03/23/2018   Tetanus 06/09/2010   TDAP status: Due, Education has been provided regarding the importance of this vaccine. Advised may receive this vaccine at local pharmacy or Health Dept. Aware to provide a copy of the vaccination record if obtained from local pharmacy or Health Dept. Verbalized acceptance and understanding.  Shingrix Completed?: No.    Education has been provided regarding the importance of this vaccine. Patient has been advised to call insurance company to determine out of pocket expense if they have not yet received this vaccine. Advised may also receive vaccine at local pharmacy or Health Dept. Verbalized acceptance and understanding.  Screening Tests Health Maintenance  Topic Date Due   HEMOGLOBIN A1C  11/17/2021   Zoster Vaccines- Shingrix (1 of 2) 03/15/2022 (Originally 08/02/1974)   TETANUS/TDAP  12/14/2022 (Originally 06/09/2020)   INFLUENZA VACCINE  01/07/2022  OPHTHALMOLOGY EXAM  10/25/2022   FOOT EXAM  11/22/2022   MAMMOGRAM  08/06/2023   COLONOSCOPY (Pts 45-81yr Insurance coverage will need to be confirmed)  09/06/2024   COVID-19 Vaccine  Completed   Hepatitis C Screening  Completed   HIV Screening  Completed   HPV VACCINES  Aged Out   PAP SMEAR-Modifier  Discontinued    Health Maintenance Health Maintenance Due  Topic Date Due   HEMOGLOBIN A1C  11/17/2021   Vision Screening: Recommended annual ophthalmology exams for early detection of glaucoma and other disorders of the eye.  Dental Screening: Recommended annual dental exams for proper oral hygiene  Community Resource Referral / Chronic Care Management: CRR required this visit?  No   CCM required this visit?  No      Plan:     I have personally reviewed and noted the following in the patient's chart:   Medical and social history Use of alcohol, tobacco or illicit drugs  Current medications and supplements including opioid prescriptions.  Functional ability and status Nutritional status Physical activity Advanced directives List of other physicians Hospitalizations, surgeries, and ER visits in previous 12 months Vitals Screenings to include cognitive, depression, and falls Referrals and appointments  In addition, I have reviewed and discussed with patient certain preventive protocols, quality metrics, and best practice recommendations. A written personalized care plan for preventive services as well as general preventive health recommendations were provided to patient.     OVarney Biles LPN   75/11/1535

## 2021-12-13 NOTE — Patient Instructions (Addendum)
  Theresa [name] , Thank you for taking time to come for your Medicare Wellness Visit. I appreciate your ongoing commitment to your health goals. Please review the following plan we discussed and let me know if I can assist you in the future.   These are the goals we discussed:  Goals      Healthy Lifestyle     Stay active. Stay hydrated. Healthy diet.        This is a list of the screening recommended for you and due dates:  Health Maintenance  Topic Date Due   Hemoglobin A1C  11/17/2021   Zoster (Shingles) Vaccine (1 of 2) 03/15/2022*   Tetanus Vaccine  12/14/2022*   Flu Shot  01/07/2022   Eye exam for diabetics  10/25/2022   Complete foot exam   11/22/2022   Mammogram  08/06/2023   Colon Cancer Screening  09/06/2024   COVID-19 Vaccine  Completed   Hepatitis C Screening: USPSTF Recommendation to screen - Ages 18-79 yo.  Completed   HIV Screening  Completed   HPV Vaccine  Aged Out   Pap Smear  Discontinued  *Topic was postponed. The date shown is not the original due date.

## 2021-12-16 ENCOUNTER — Encounter: Payer: Self-pay | Admitting: Internal Medicine

## 2021-12-16 NOTE — Unmapped (Signed)
Patient states she went to [facility] and they were unable to get labs on her. She was stuck multiple times.  Encouraged her to make sure she is hydrated before labs and warm. She will start wearing layers since she is always cold    She has a clinic apt Friday and will repeat labs then    Otherwise reports she is well and drinking more fluids than she was

## 2021-12-18 ENCOUNTER — Ambulatory Visit (INDEPENDENT_AMBULATORY_CARE_PROVIDER_SITE_OTHER): Payer: Medicare Other | Admitting: Internal Medicine

## 2021-12-18 ENCOUNTER — Encounter: Payer: Self-pay | Admitting: Internal Medicine

## 2021-12-18 VITALS — BP 120/80 | HR 73 | Temp 97.8°F | Resp 16 | Ht 67.5 in | Wt 145.4 lb

## 2021-12-18 DIAGNOSIS — I5032 Chronic diastolic (congestive) heart failure: Secondary | ICD-10-CM | POA: Diagnosis not present

## 2021-12-18 DIAGNOSIS — R011 Cardiac murmur, unspecified: Secondary | ICD-10-CM

## 2021-12-18 DIAGNOSIS — E119 Type 2 diabetes mellitus without complications: Secondary | ICD-10-CM | POA: Diagnosis not present

## 2021-12-18 DIAGNOSIS — I38 Endocarditis, valve unspecified: Secondary | ICD-10-CM | POA: Diagnosis not present

## 2021-12-18 DIAGNOSIS — I700 Atherosclerosis of aorta: Secondary | ICD-10-CM | POA: Diagnosis not present

## 2021-12-18 LAB — POCT GLYCOSYLATED HEMOGLOBIN (HGB A1C): Hemoglobin A1C: 5.9 % — AB (ref 4.0–5.6)

## 2021-12-18 NOTE — Patient Instructions (Addendum)
Warm water and hydrogen peroxide or debrox ear wax drops   [employer] rosemary scalp oil    START taking these medications  azaTHIOprine 50 mg tablet; Commonly known as: IMURAN; Take 2 tablets  (100 mg total) by mouth daily.; Start taking on: December 01, 2021  CONTINUE taking these medications  acetaminophen 500 MG tablet; Commonly known as: TYLENOL; Take 1-2  tablets (500-1,000 mg total) by mouth every six (6) hours as needed for  pain or fever (> 38C or 100.76F). albuterol 90 mcg/actuation inhaler; Commonly known as: PROVENTIL  HFA;VENTOLIN HFA amLODIPine 10 MG tablet; Commonly known as: NORVASC; Take 1 tablet (10  mg total) by mouth daily. aspirin 81 MG tablet; Commonly known as: ECOTRIN atorvastatin 20 MG tablet; Commonly known as: LIPITOR; Take 1 tablet (20  mg total) by mouth daily. blood-glucose meter kit cloNIDine HCL 0.1 MG tablet; Commonly known as: CATAPRES; Take 2 tablets  (0.2 mg total) by mouth Two (2) times a day. CONTOUR TEST STRIPS Strp; Generic drug: blood sugar diagnostic; by Other  route two (2) times a day. TEST BLOOD SUGARS 2 TIMES DAILY * ENVARSUS XR 1 mg Tb24 extended release tablet; Generic drug:  tacrolimus; Take 3 (18m) tablet with 2 (418m tablets every morning to  equal 1121maily dose * ENVARSUS XR 4 mg Tb24 extended release tablet; Generic drug:  tacrolimus; Take 2 tablets (8 mg total) by mouth daily. Take 2 (4mg59mtablets with 3 (1mg)36mblet every morning to equal 11mg 71my dose ergocalciferol-1,250 mcg (50,000 unit) 1,250 mcg (50,000 unit) capsule;  Commonly known as: VITAMIN D2-1,250 mcg (50,000 unit); Take 1 capsule  (1,250 mcg total) by mouth once a week. Take for 8 weeks as directed. esomeprazole 20 MG capsule; Commonly known as: NexIUM; Take 1 capsule  (20 mg total) by mouth daily. ferrous sulfate 325 (65 FE) MG EC tablet; Take 1 tablet (325 mg total)  by mouth every other day. furosemide 40 MG tablet; Commonly known as: LASIX; Take 1  tablet (40 mg  total) by mouth Two (2) times a day. isosorbide mononitrate 30 MG 24 hr tablet; Commonly known as: IMDUR LANCETS,THIN MISC LOKELMA 10 gram Pwpk packet; Generic drug: sodium zirconium  cyclosilicate; Take 1 packet (10 g total) by mouth daily as needed. metFORMIN 500 MG tablet; Commonly known as: GLUCOPHAGE; Take 1 tablet  (500 mg total) by mouth in the morning and 1 tablet (500 mg total) in the  evening. Take with meals. nitroglycerin 0.4 MG SL tablet; Commonly known as: NITROSTAT pregabalin 75 MG capsule; Commonly known as: LYRICA; Take 1 capsule (75  mg total) by mouth every morning AND 2 capsules (150 mg total) nightly. sodium bicarbonate 650 mg tablet; Take 1 tablet (650 mg total) by mouth  Two (2) times a day. * This list has 2 medication(s) that are the same as other medications  prescribed for you. Read the directions carefully, and ask your doctor or  other care provider to review them with you.  STOP taking these medications  docusate sodium 100 MG capsule; Commonly known as: COLACE hydrALAZINE 50 MG tablet; Commonly known as: APRESOLINE lidocaine 5 % patch; Commonly known as: LIDODERM mycophenolate 180 MG EC tablet; Commonly known as: MYFORTIC polyethylene glycol 17 gram packet; Commonly known as: MIRALAX  Mitral Valve Regurgitation  Mitral valve regurgitation, also called mitral regurgitation, is a condition in which some blood leaks back (regurgitates) through the mitral valve in the heart. The mitral valve is located between the upper left  chamber (left atrium) and the lower left chamber (left ventricle) of the heart. When blood travels through the heart, it goes from the left atrium to the left ventricle and then out to the body. Normally, the mitral valve opens when the atrium pumps blood into the ventricle, and it closes when the ventricle pumps blood out to the body. Mitral valve regurgitation happens when the mitral valve does not close properly. As a  result, blood in the ventricle leaks back into the atrium. Mitral valve regurgitation causes the heart to work harder to pump blood. If the condition is mild, a person may not have symptoms. However, over time, this can lead to heart failure. What are the causes? This condition may be caused by: A condition in which the mitral valve does not close completely when the heart pumps blood (mitral valve prolapse). Infections, such as: An infection of the inner lining of the heart or the heart valves (endocarditis). An inflammatory condition that can develop after an untreated strep throat infection (rheumatic fever). Certain types of heart disease. Certain conditions that are present at birth (congenital heart defect). Previous radiation therapy to the chest area. Damage to the mitral valve, such as from injury (trauma) to the heart or a heart attack. Certain combinations of weight-loss (anti-obesity) medicines. What are the signs or symptoms? In some cases of mild to moderate mitral regurgitation, there are no symptoms. Symptoms of this condition include: Shortness of breath. Tiredness (fatigue). Activity intolerance. Cough. Severe symptoms of this condition include: Suddenly waking up at night with difficulty breathing. Fast or irregular heartbeat. Swelling in the lower legs, ankles, and feet. Fluid in the lungs that makes it very hard to breathe (pulmonary edema). How is this diagnosed? This condition may be diagnosed based on: A physical exam. Your health care provider will listen to your heart for an abnormal heart sound (murmur). Tests to confirm the diagnosis. These may include: A test that creates ultrasound images of the heart (echocardiogram). This test allows your health care provider to see how the heart valves work while your heart is beating. Chest X-ray or MRI. A test that records the electrical impulses of the heart (electrocardiogram, or ECG). A test that looks at the  structure and function of the heart (cardiac catheterization). How is this treated? This condition may be treated with: Medicines. These may be given to treat symptoms and prevent complications. Surgery or catheter-based procedures to repair or replace the mitral valve. This may be done in severe, long-term (chronic) cases. Follow these instructions at home: Eating and drinking  Eat a low-salt (low-sodium), heart-healthy diet that includes whole grains, fresh fruits and vegetables, low-fat (lean) proteins, and low-fat or nonfat dairy products. Consider working with a dietitian to help you make healthy food choices. Follow instructions from your health care provider about any other eating or drinking restrictions, such as limiting foods that are high in fat and processed sugars. Use healthy cooking methods, such as roasting, grilling, broiling, baking, poaching, steaming, or stir-frying. Alcohol use Do not drink alcohol if: Your health care provider tells you not to drink. You are pregnant, may be pregnant, or are planning to become pregnant. If you drink alcohol: Limit how much you have to: 0-1 drink a day for women. 0-2 drinks a day for men. Know how much alcohol is in your drink. In the [place] one drink equals one 12 oz bottle of beer (355 mL), one 5 oz glass of wine (148 mL), or one 1 oz  glass of hard liquor (44 mL). Activity Return to your normal activities as told by your health care provider. Ask your health care provider what activities are safe for you. Regular exercise is important for the health of your heart and for maintaining a healthy weight. Ask your health care provider what type of exercise is safe for you. You may need to avoid exercise that takes a lot of effort. Lifestyle Maintain a healthy weight. Do not use any products that contain nicotine or tobacco. These products include cigarettes, chewing tobacco, and vaping devices, such as e-cigarettes. If you need help  quitting, ask your health care provider. Find ways to manage stress. If you need help with this, ask your health care provider. General instructions Take over-the-counter and prescription medicines only as told by your health care provider. Work closely with your health care provider to manage any other health conditions you have, such as diabetes or high blood pressure. If you plan to become pregnant, talk with your health care provider first. Keep all follow-up visits. This is important. Contact a health care provider if: You have a fever. You feel more tired than usual when doing physical activity. You have a dry cough. Get help right away if: You have shortness of breath. You develop chest pain. You have swelling in your hands, feet, ankles, or abdomen that is getting worse. You have trouble staying awake or you faint. You feel dizzy or unsteady. You suddenly gain weight. You feel confused. These symptoms may be an emergency. Get help right away. Call 911. Do not wait to see if the symptoms will go away. Do not drive yourself to the hospital. Summary Mitral valve regurgitation, also called mitral regurgitation, is a condition in which some blood leaks back (regurgitates) through the mitral valve in the heart. Depending on how severe your condition is, you may be treated with medicines, catheter-based procedures, or surgery. Practice heart-healthy habits to manage this condition. These include limiting alcohol, avoiding nicotine and tobacco, and eating a heart-healthy diet that is low in salt (sodium). This information is not intended to replace advice given to you by your health care provider. Make sure you discuss any questions you have with your health care provider. Document Revised: 02/08/2021 Document Reviewed: 02/08/2021 Elsevier Patient Education  [place].  Heart Failure Action Plan A heart failure action plan helps you understand what to do when you have symptoms  of heart failure. Your action plan is a color-coded plan that lists the symptoms to watch for and indicates what actions to take. If you have symptoms in the red zone, you need medical care right away. If you have symptoms in the yellow zone, you are having problems. If you have symptoms in the green zone, you are doing well. Follow the plan that was created by you and your health care provider. Review your plan each time you visit your health care provider. Red zone These signs and symptoms mean you should get medical help right away: You have trouble breathing when resting. You have a dry cough that is getting worse. You have swelling or pain in your legs or abdomen that is getting worse. You suddenly gain more than 2-3 lb (0.9-1.4 kg) in 24 hours, or more than 5 lb (2.3 kg) in a week. This amount may be more or less depending on your condition. You have trouble staying awake or you feel confused. You have chest pain. You do not have an appetite. You pass out. You  have worsening sadness or depression. If you have any of these symptoms, call your local emergency services (911 in the [place] right away. Do not drive yourself to the hospital. Yellow zone These signs and symptoms mean your condition may be getting worse and you should make some changes: You have trouble breathing when you are active, or you need to sleep with your head raised on extra pillows to help you breathe. You have swelling in your legs or abdomen. You gain 2-3 lb (0.9-1.4 kg) in 24 hours, or 5 lb (2.3 kg) in a week. This amount may be more or less depending on your condition. You get tired easily. You have trouble sleeping. You have a dry cough. If you have any of these symptoms: Contact your health care provider within the next day. Your health care provider may adjust your medicines. Green zone These signs mean you are doing well and can continue what you are doing: You do not have shortness of breath. You have  very little swelling or no new swelling. Your weight is stable (no gain or loss). You have a normal activity level. You do not have chest pain or any other new symptoms. Follow these instructions at home: Take over-the-counter and prescription medicines only as told by your health care provider. Weigh yourself daily. Your target weight is __________ lb (__________ kg). Call your health care provider if you gain more than __________ lb (__________ kg) in 24 hours, or more than __________ lb (__________ kg) in a week. Health care provider name: _____________________________________________________ Health care provider phone number: _____________________________________________________ Eat a heart-healthy diet. Work with a diet and nutrition specialist (dietitian) to create an eating plan that is best for you. Keep all follow-up visits. This is important. Where to find more information American Heart Association: www.heart.org Summary A heart failure action plan helps you understand what to do when you have symptoms of heart failure. Follow the action plan that was created by you and your health care provider. Get help right away if you have any symptoms in the red zone. This information is not intended to replace advice given to you by your health care provider. Make sure you discuss any questions you have with your health care provider. Document Revised: 01/09/2020 Document Reviewed: 01/09/2020 Elsevier Patient Education  [place], Adult The ears produce a substance called earwax that helps keep bacteria out of the ear and protects the skin in the ear canal. Occasionally, earwax can build up in the ear and cause discomfort or hearing loss. What are the causes? This condition is caused by a buildup of earwax. Ear canals are self-cleaning. Ear wax is made in the outer part of the ear canal and generally falls out in small amounts over time. When the self-cleaning  mechanism is not working, earwax builds up and can cause decreased hearing and discomfort. Attempting to clean ears with cotton swabs can push the earwax deep into the ear canal and cause decreased hearing and pain. What increases the risk? This condition is more likely to develop in people who: Clean their ears often with cotton swabs. Pick at their ears. Use earplugs or in-ear headphones often, or wear hearing aids. The following factors may also make you more likely to develop this condition: Being female. Being of older age. Naturally producing more earwax. Having narrow ear canals. Having earwax that is overly thick or sticky. Having excess hair in the ear canal. Having eczema. Being dehydrated. What are the  signs or symptoms? Symptoms of this condition include: Reduced or muffled hearing. A feeling of fullness in the ear or feeling that the ear is plugged. Fluid coming from the ear. Ear pain or an itchy ear. Ringing in the ear. Coughing. Balance problems. An obvious piece of earwax that can be seen inside the ear canal. How is this diagnosed? This condition may be diagnosed based on: Your symptoms. Your medical history. An ear exam. During the exam, your health care provider will look into your ear with an instrument called an otoscope. You may have tests, including a hearing test. How is this treated? This condition may be treated by: Using ear drops to soften the earwax. Having the earwax removed by a health care provider. The health care provider may: Flush the ear with water. Use an instrument that has a loop on the end (curette). Use a suction device. Having surgery to remove the wax buildup. This may be done in severe cases. Follow these instructions at home:  Take over-the-counter and prescription medicines only as told by your health care provider. Do not put any objects, including cotton swabs, into your ear. You can clean the opening of your ear canal with a  washcloth or facial tissue. Follow instructions from your health care provider about cleaning your ears. Do not overclean your ears. Drink enough fluid to keep your urine pale yellow. This will help to thin the earwax. Keep all follow-up visits as told. If earwax builds up in your ears often or if you use hearing aids, consider seeing your health care provider for routine, preventive ear cleanings. Ask your health care provider how often you should schedule your cleanings. If you have hearing aids, clean them according to instructions from the manufacturer and your health care provider. Contact a health care provider if: You have ear pain. You develop a fever. You have pus or other fluid coming from your ear. You have hearing loss. You have ringing in your ears that does not go away. You feel like the room is spinning (vertigo). Your symptoms do not improve with treatment. Get help right away if: You have bleeding from the affected ear. You have severe ear pain. Summary Earwax can build up in the ear and cause discomfort or hearing loss. The most common symptoms of this condition include reduced or muffled hearing, a feeling of fullness in the ear, or feeling that the ear is plugged. This condition may be diagnosed based on your symptoms, your medical history, and an ear exam. This condition may be treated by using ear drops to soften the earwax or by having the earwax removed by a health care provider. Do not put any objects, including cotton swabs, into your ear. You can clean the opening of your ear canal with a washcloth or facial tissue. This information is not intended to replace advice given to you by your health care provider. Make sure you discuss any questions you have with your health care provider. Document Revised: 09/13/2019 Document Reviewed: 09/13/2019 Elsevier Patient Education  [place].

## 2021-12-18 NOTE — Progress Notes (Signed)
Chief Complaint  Patient presents with   Follow-up    6 mon, denies any concerns or pain   F/u  Hospitalized 6/21-6/24/23 [facility] due to AKI 2/2 diarrhea form myfortic and now on azathioprine 100 mg qd s/p kidney transplant and diarrhea better stopped metformin and lasix at discharge. She also had CHF exacerbation with water weight gain at admission now resolved  echo 05/2021 with moderate MVR, AVR mild and mild AS f/u Dr. Rockey Situ 02/2022. She went home with 3 days of home health  Dm 2 A1c 7.2 05/2021 poc a1c 5.9 today off metformin    Review of Systems  Constitutional:  Negative for weight loss.  HENT:  Negative for hearing loss.   Eyes:  Negative for blurred vision.  Respiratory:  Negative for shortness of breath.   Cardiovascular:  Negative for chest pain and leg swelling.  Gastrointestinal:  Negative for abdominal pain and blood in stool.  Genitourinary:  Negative for dysuria.  Musculoskeletal:  Negative for falls and joint pain.  Skin:  Negative for rash.  Neurological:  Negative for headaches.  Psychiatric/Behavioral:  Negative for depression.    Past Medical History:  Diagnosis Date   Anemia of chronic disease    Anginal pain ([place])    Carotid arterial disease ([place])    a. 02/2013 U/S: 40-59% bilat ICA stenosis.   Chronic constipation    Chronic diastolic CHF (congestive heart failure) ([place])    a. 10/2013 Echo [facility]): EF 55-60%, mod conc LVH, mod MR, mildly dil LA, mild Ao sclerosis w/o stenosis; b. 02/2016 Echo: EF 55-60%, no rwma, Gr DD, mild AS, mod to sev MR, mildly dil LA, PASP 62mHg; c. 07/2017 Echo ([facility]: EF >55%, mild to mod LVH, Gr2 DD, Ao scl, Mod MR, mod dil LA, nl RV fxn.   Colon polyps    COPD (chronic obstructive pulmonary disease) (HCC)    Coronary artery disease    a. 05/2013 NSTEMI/PCI: LM 20d, LAD min irregs, LCX small, nl, OM1 nl, RCA dom 946m2.5x16 Promus DES), PDA1 80p; b. 04/2015 Cath: LM nl, LAD 3089m1/2 min irregs, LCX 35p/m, OM2/3 min irregs, RCA patent mid  stent, RPDA 70ost, RPLB1 30, RPLB2/3 min irregs, EF 55-65%--> Med Rx; c. [facility] EF 64%.   COVID-19    06/2021   Diabetes mellitus    Diabetic neuropathy ([place]  Diabetic retinopathy ([facility]/2014   Hx bilat retinal detachment, proliferative diab retinopathy and bilat vitreous hemorrhage    Emphysema    [place]  [place]. [facility] [place] [phone]/Dr. [name], on a MWF schedule.  She started dialysis in Feb 2014.  Etiology of renal failure not known, likely diabetes.  Has a left upper arm AV graft.   History of bronchitis    Mar 2012   History of pneumonia    June 2012   History of tobacco abuse    a. Quit 2012.   Hyperlipidemia    Hypertension    Moderate to severe mitral insufficiency    a. 10/2013 Echo: EF 55-60%, mod MR; b. 02/2016 Echo: EF 55-60%, mod to sev MR directed posteriorly--felt to be dynamic -worse with volume overload; [facility]. [facility] Mod [facility].   [facility]   Peripheral vascular disease ([facility]  Sickle cell trait ([place]  Thyroid nodule    [place] due to f/u [place] 1 year    Valvular  heart disease    echo 06/17/21 mild to moderate   Past Surgical History:  Procedure Laterality Date   A/V FISTULAGRAM Left 11/10/2017   Procedure: A/V FISTULAGRAM;  Surgeon: Katha Cabal, MD;  Location: [place] CV LAB;  Service: Cardiovascular;  Laterality: Left;   A/V SHUNTOGRAM Left 02/08/2019   Procedure: A/V SHUNTOGRAM;  Surgeon: Katha Cabal, MD;  Location: [place] CV LAB;  Service: Cardiovascular;  Laterality: Left;   ABDOMINAL HYSTERECTOMY     2000 for cysts on ovaries total no abnormal pap   CARDIAC CATHETERIZATION     CARDIAC CATHETERIZATION N/A 05/02/2015   Procedure: Left Heart Cath and Coronary Angiography;  Surgeon: [name], MD;  Location: [place] CV LAB;  Service: Cardiovascular;  Laterality: N/A;   COLONOSCOPY WITH PROPOFOL N/A 07/08/2016    Procedure: COLONOSCOPY WITH PROPOFOL;  Surgeon: [name], MD;  Location: [facility] ENDOSCOPY;  Service: Endoscopy;  Laterality: N/A;   COLONOSCOPY WITH PROPOFOL N/A 08/20/2017   Procedure: COLONOSCOPY WITH PROPOFOL;  Surgeon: [name], MD;  Location: [facility] ENDOSCOPY;  Service: Gastroenterology;  Laterality: N/A;   COLONOSCOPY WITH PROPOFOL N/A 09/06/2021   Procedure: COLONOSCOPY WITH PROPOFOL;  Surgeon: [name], MD;  Location: [facility] ENDOSCOPY;  Service: Gastroenterology;  Laterality: N/A;   colonscopy     CORONARY ANGIOPLASTY  05/28/2014   stent placement to the mid RCA   CYSTOURETHROSCOPY     [facility] urology with stent placement 11 or 05/2019 and stent removal 06/13/19    DILATION AND CURETTAGE OF UTERUS     several in the early 80's   ESOPHAGOGASTRODUODENOSCOPY     2012   ESOPHAGOGASTRODUODENOSCOPY N/A 09/06/2021   Procedure: ESOPHAGOGASTRODUODENOSCOPY (EGD);  Surgeon: [name], MD;  Location: [facility] ENDOSCOPY;  Service: Gastroenterology;  Laterality: N/A;   ESOPHAGOGASTRODUODENOSCOPY (EGD) WITH PROPOFOL N/A 03/11/2018   Procedure: ESOPHAGOGASTRODUODENOSCOPY (EGD) WITH PROPOFOL;  Surgeon: [name], MD;  Location: [facility] ENDOSCOPY;  Service: Gastroenterology;  Laterality: N/A;   EYE SURGERY     bilateral laser 2012   EYE SURGERY     right   EYE SURGERY     x4 both eyes   GAS INSERTION  09/30/2011   Procedure: INSERTION OF GAS;  Surgeon: [name], MD;  Location: [place];  Service: Ophthalmology;  Laterality: Right;  [id number]   GAS/FLUID EXCHANGE  09/30/2011   Procedure: GAS/FLUID EXCHANGE;  Surgeon: [name], MD;  Location: [place];  Service: Ophthalmology;  Laterality: Right;   KIDNEY TRANSPLANT     03/30/19 Dr. Mickel Baas Thomas/Dr. Cristie Hem Zendel/Dr. Gilford Raid; [facility] transplant Dr. Horald Chestnut will be primary [phone] coordinator Sarah Wynkoop [phone]   KIDNEY TRANSPLANT     03/30/19 [facility]   LEFT HEART CATHETERIZATION WITH CORONARY ANGIOGRAM N/A 05/28/2013   Procedure: LEFT  HEART CATHETERIZATION WITH CORONARY ANGIOGRAM;  Surgeon: [name], MD;  Location: [facility] CATH LAB;  Service: Cardiovascular;  Laterality: N/A;   PARS PLANA VITRECTOMY  04/22/2011   Procedure: PARS PLANA VITRECTOMY WITH 25 GAUGE;  Surgeon: [name], MD;  Location: [facility];  Service: Ophthalmology;  Laterality: Left;  membrane peel, endolaser, gas fluid exchange, silicone oil, repair of complex traction retinal detachment   PARS PLANA VITRECTOMY  09/30/2011   Procedure: PARS PLANA VITRECTOMY WITH 25 GAUGE;  Surgeon: [name], MD;  Location: [place];  Service: Ophthalmology;  Laterality: Right;  Endolaser; Repair of Complex Traction Retinal Detachment   PARS PLANA VITRECTOMY  02/24/2012  Procedure: PARS PLANA VITRECTOMY WITH 25 GAUGE;  Surgeon: [name], MD;  Location: [place];  Service: Ophthalmology;  Laterality: Left;   PTCA     SILICON OIL REMOVAL  24/40/1027   Procedure: SILICON OIL REMOVAL;  Surgeon: [name], MD;  Location: [place];  Service: Ophthalmology;  Laterality: Left;   THROMBECTOMY / ARTERIOVENOUS GRAFT REVISION     TUBAL LIGATION     1979   UPPER EXTREMITY ANGIOGRAPHY Left 02/08/2019   Procedure: UPPER EXTREMITY ANGIOGRAPHY;  Surgeon: Katha Cabal, MD;  Location: [place] CV LAB;  Service: Cardiovascular;  Laterality: Left;   Family History  Problem Relation Age of Onset   Stroke Mother    Heart attack Mother    Heart disease Mother    Colon cancer Father    Colon cancer Sister    Breast cancer Sister    Heart attack Brother    Stroke Brother    Diabetes Brother    Diabetes Brother    Diabetes Daughter    Renal Disease Daughter        on HD   Kidney failure Daughter        age 8 as of 12/03/20   Anesthesia problems Neg Hx    Hypotension Neg Hx    Malignant hyperthermia Neg Hx    Pseudochol deficiency Neg Hx    Social History   Socioeconomic History   Marital status: Single    Spouse name: Not on file   Number of children:  2   Years of education: Not on file   Highest education level: Not on file  Occupational History   Not on file  Tobacco Use   Smoking status: Former    Packs/day: 1.00    Years: 25.00    Total pack years: 25.00    Types: Cigarettes    Quit date: 03/30/2019    Years since quitting: 2.7   Smokeless tobacco: Never  Vaping Use   Vaping Use: Never used  Substance and Sexual Activity   Alcohol use: No   Drug use: Yes    Types: Marijuana   Sexual activity: Never  Other Topics Concern   Not on file  Social History Narrative   On disability.    Lives with son Brenton Grills   2 children (has son and daughter)      Pandora Leiter drives her since her vision has decreased   Social Determinants of Health   Financial Resource Strain: Low Risk  (12/13/2021)   Overall Financial Resource Strain (CARDIA)    Difficulty of Paying Living Expenses: Not hard at all  Food Insecurity: No Food Insecurity (12/13/2021)   Hunger Vital Sign    Worried About Running Out of Food in the Last Year: Never true    [place] in the Last Year: Never true  Transportation Needs: No Transportation Needs (12/13/2021)   PRAPARE - Hydrologist (Medical): No    Lack of Transportation (Non-Medical): No  Physical Activity: Insufficiently Active (12/13/2021)   Exercise Vital Sign    Days of Exercise per Week: 7 days    Minutes of Exercise per Session: 10 min  Stress: No Stress Concern Present (12/13/2021)   [place]    Feeling of Stress : Not at all  Social Connections: Unknown (12/13/2021)   Social Connection and Isolation Panel [NHANES]    Frequency of Communication with Friends and Family: More than  three times a week    Frequency of Social Gatherings with Friends and Family: More than three times a week    Attends Religious Services: Not on file    Active Member of Clubs or Organizations: Not on file    Attends Archivist  Meetings: Not on file    Marital Status: Not on file  Intimate Partner Violence: Not At Risk (12/13/2021)   Humiliation, Afraid, Rape, and Kick questionnaire    Fear of Current or Ex-Partner: No    Emotionally Abused: No    Physically Abused: No    Sexually Abused: No   Current Meds  Medication Sig   acetaminophen (TYLENOL) 500 MG tablet Take 1,000 mg by mouth daily as needed.   acetaminophen (TYLENOL) 500 MG tablet Take by mouth.   albuterol (PROVENTIL) (2.5 MG/3ML) 0.083% nebulizer solution Take 3 mLs (2.5 mg total) by nebulization every 6 (six) hours as needed for wheezing or shortness of breath.   albuterol (VENTOLIN HFA) 108 (90 Base) MCG/ACT inhaler Inhale 1-2 puffs into the lungs every 6 (six) hours as needed for wheezing or shortness of breath.   amLODipine (NORVASC) 10 MG tablet Take 1 tablet (10 mg total) by mouth daily.   aspirin EC 81 MG tablet Take 81 mg by mouth daily. Swallow whole.   atorvastatin (LIPITOR) 20 MG tablet Take 1 tablet (20 mg total) by mouth daily.   cloNIDine (CATAPRES) 0.1 MG tablet Take 1 tablet (0.1 mg total) by mouth 2 (two) times daily. May take an extra 0.1 mg clonidine for blood pressure >160   ENVARSUS XR 1 MG TB24 Take 1 mg by mouth daily as needed. Total dose is 9 mg every morning at 0900.   esomeprazole (NEXIUM) 20 MG capsule Take 20 mg by mouth daily.   ferrous sulfate 325 (65 FE) MG EC tablet Take 1 tablet (325 mg total) by mouth 3 (three) times daily with meals.   furosemide (LASIX) 40 MG tablet Take 1 tablet (40 mg) by mouth once daily, you may take 1 extra tablet (40 mg) once daily at lunch as needed for weight gain/ swelling   isosorbide mononitrate (IMDUR) 30 MG 24 hr tablet Take 1 tablet (30 mg total) by mouth daily.   lidocaine (LIDODERM) 5 % Place onto the skin.   loratadine (CLARITIN) 10 MG tablet Take 10 mg by mouth daily.   magnesium oxide (MAG-OX) 400 MG tablet Take 400 mg by mouth 2 (two) times daily.   metFORMIN (GLUCOPHAGE) 500 MG  tablet Take by mouth.   mycophenolate (MYFORTIC) 180 MG EC tablet Take by mouth.   nitroGLYCERIN (NITROSTAT) 0.4 MG SL tablet Place 1 tablet (0.4 mg total) under the tongue every 5 (five) minutes as needed for chest pain.   nitroGLYCERIN (NITROSTAT) 0.4 MG SL tablet Place under the tongue.   pregabalin (LYRICA) 75 MG capsule Take 1 capsule (75 mg total) by mouth 2 (two) times daily.   sodium bicarbonate 650 MG tablet Take 650 mg by mouth 2 (two) times daily.   sodium chloride (OCEAN) 0.65 % SOLN nasal spray Place 2 sprays into both nostrils as needed for congestion.   Sodium Zirconium Cyclosilicate (LOKELMA PO) Take by mouth in the morning and at bedtime.   tacrolimus ER (ENVARSUS XR) 4 MG TB24 Take 8 mg by mouth daily.   Vitamin D, Ergocalciferol, (DRISDOL) 1.25 MG (50000 UNIT) CAPS capsule Take 50,000 Units by mouth every 7 (seven) days. Mondays   Allergies  Allergen Reactions  Hydrocodone Hives   Lisinopril     Unknown reaction    Recent Results (from the past 2160 hour(s))  HM DIABETES EYE EXAM     Status: None   Collection Time: 10/24/21 12:00 AM  Result Value Ref Range   HM Diabetic Eye Exam No Retinopathy No Retinopathy    Comment: [facility]   POCT glycosylated hemoglobin (Hb A1C)     Status: Abnormal   Collection Time: 12/18/21 11:59 AM  Result Value Ref Range   Hemoglobin A1C 5.9 (A) 4.0 - 5.6 %   HbA1c POC (<> result, manual entry)     HbA1c, POC (prediabetic range)     HbA1c, POC (controlled diabetic range)     Objective  Body mass index is 22.44 kg/m. Wt Readings from Last 3 Encounters:  12/18/21 145 lb 6.4 oz (66 kg)  12/13/21 151 lb (68.5 kg)  10/14/21 151 lb 8 oz (68.7 kg)   Temp Readings from Last 3 Encounters:  12/18/21 97.8 F (36.6 C) (Oral)  09/06/21 (!) 97 F (36.1 C) (Temporal)  08/29/21 98.1 F (36.7 C) (Oral)   BP Readings from Last 3 Encounters:  12/18/21 120/80  10/14/21 (!) 140/48  09/06/21 (!) 197/55   Pulse Readings from Last 3  Encounters:  12/18/21 73  10/14/21 80  09/06/21 75    Physical Exam Vitals and nursing note reviewed.  Constitutional:      Appearance: Normal appearance. She is well-developed and well-groomed.  HENT:     Head: Normocephalic and atraumatic.  Eyes:     Conjunctiva/sclera: Conjunctivae normal.     Pupils: Pupils are equal, round, and reactive to light.  Cardiovascular:     Rate and Rhythm: Normal rate and regular rhythm.     Heart sounds: Murmur heard.  Pulmonary:     Effort: Pulmonary effort is normal.     Breath sounds: Normal breath sounds.  Abdominal:     General: Abdomen is flat. Bowel sounds are normal.     Tenderness: There is no abdominal tenderness.  Musculoskeletal:        General: No tenderness.  Skin:    General: Skin is warm and dry.  Neurological:     General: No focal deficit present.     Mental Status: She is alert and oriented to person, place, and time. Mental status is at baseline.     Cranial Nerves: Cranial nerves 2-12 are intact.     Motor: Motor function is intact.     Coordination: Coordination is intact.     Gait: Gait is intact.     Comments: Walks with cane  Psychiatric:        Attention and Perception: Attention and perception normal.        Mood and Affect: Mood and affect normal.        Speech: Speech normal.        Behavior: Behavior normal. Behavior is cooperative.        Thought Content: Thought content normal.        Cognition and Memory: Cognition and memory normal.        Judgment: Judgment normal.     Assessment  Plan  Type 2 diabetes mellitus without complication, without long-term current use of insulin ([place]) - Plan: POCT glycosylated hemoglobin (Hb A1C) 5.9 off meds due to kidney function as of 11/2021  Rec healthy diet and exercise  Heart murmur Chronic diastolic congestive heart failure (HCC) Valvular heart disease moderate MVR, mild AVR, mild AS  F/u Dr. Rockey Situ 02/2022  HM A1c 7.2 05/19/21 >5.9 12/18/21  Flu shot utd   pna 23 utd  prevnar utd covid pfizer  5/5 s/p renal transplant    Tdap utd 08/2021   Hep B immune had engerix as well 4 doses 2014-2015  Shingrix 2/2 last 12/18/21    HCV neg 04/25/19 Hep B sAb 98.50 03/28/19 HIV neg 03/28/19     Pap neg 11/13/16 neg HPV s/p hysterectomy  -will ask in future if h/o abnormal pap if so will do pap if not d/c paps    mammo 12/2020 breast calcifications needs repeat dx b/l in 6 months orders in  Mammo 08/05/21 b/l diagnostic b/l diag mammo left in 1 year and screening right due   h/o multiple polyps colonoscopy 08/20/17 repeat in 3 years [facility] or [facility] can do this  -EGD 03/11/18 neg gastritis  -EGD ulcers 04/2019 neg HSV/H pylori [facility] egd  08/25/17 several polyps due 08/25/20 tubular adenoma  Referred Dr. [name] appt 06/2021 and Dr. Marius Ditch 05/22/21    Former smoker x 25-30 years <1 ppd  CTA chest 01/12/18 moderate b/l pleural effusions and CAD and aortic atherosclerosis, no nodules   ct chest had 09/12/20 [facility]    Thyroid nodules/parathyroid mass   [place] 06/16/17 right upper thyroid nodule ? Parathyroid mass 1.1 cm and left mid nodule f/u annually last 02/01/20    Thyroid US 02/01/20 [facility]  Additional similar solid cystic or cystic nodules identified, without suspicious features.  Incidentally noted nonocclusive atherosclerotic plaque noted in the right common carotid artery.  IMPRESSION:  No suspicious thyroid nodules identified.      GI appt [facility] 06/20/19 f/u esophageal ulcers Dr. [name] Cards Dr. Rockey Situ Renal [facility] renal and transplant s/p kidney transplant 03/28/20   [place] Plan eye exam 10/24/21 Q 48month  Provider: Dr. TOlivia MackieMcLean-Scocuzza-Internal Medicine

## 2021-12-18 NOTE — Progress Notes (Signed)
Med list updated in pt's chart & vaccine record sent to mail.

## 2021-12-20 ENCOUNTER — Ambulatory Visit: Admit: 2021-12-20 | Discharge: 2021-12-20 | Payer: MEDICARE | Attending: Nephrology | Primary: Nephrology

## 2021-12-20 ENCOUNTER — Ambulatory Visit: Admit: 2021-12-20 | Discharge: 2021-12-21 | Payer: MEDICARE

## 2021-12-20 DIAGNOSIS — N186 End stage renal disease: Principal | ICD-10-CM

## 2021-12-20 DIAGNOSIS — Z940 Kidney transplant status: Principal | ICD-10-CM

## 2021-12-20 DIAGNOSIS — D509 Iron deficiency anemia, unspecified: Secondary | ICD-10-CM | POA: Diagnosis not present

## 2021-12-20 DIAGNOSIS — K5730 Diverticulosis of large intestine without perforation or abscess without bleeding: Secondary | ICD-10-CM | POA: Diagnosis not present

## 2021-12-20 DIAGNOSIS — I2510 Atherosclerotic heart disease of native coronary artery without angina pectoris: Secondary | ICD-10-CM | POA: Diagnosis not present

## 2021-12-20 DIAGNOSIS — M545 Low back pain, unspecified: Secondary | ICD-10-CM | POA: Diagnosis not present

## 2021-12-20 DIAGNOSIS — I739 Peripheral vascular disease, unspecified: Secondary | ICD-10-CM | POA: Diagnosis not present

## 2021-12-20 DIAGNOSIS — Z6822 Body mass index (BMI) 22.0-22.9, adult: Secondary | ICD-10-CM | POA: Diagnosis not present

## 2021-12-20 DIAGNOSIS — I340 Nonrheumatic mitral (valve) insufficiency: Secondary | ICD-10-CM | POA: Diagnosis not present

## 2021-12-20 DIAGNOSIS — I38 Endocarditis, valve unspecified: Secondary | ICD-10-CM | POA: Diagnosis not present

## 2021-12-20 DIAGNOSIS — E1151 Type 2 diabetes mellitus with diabetic peripheral angiopathy without gangrene: Secondary | ICD-10-CM | POA: Diagnosis not present

## 2021-12-20 DIAGNOSIS — E782 Mixed hyperlipidemia: Secondary | ICD-10-CM | POA: Diagnosis not present

## 2021-12-20 DIAGNOSIS — G4733 Obstructive sleep apnea (adult) (pediatric): Secondary | ICD-10-CM | POA: Diagnosis not present

## 2021-12-20 DIAGNOSIS — M5416 Radiculopathy, lumbar region: Secondary | ICD-10-CM | POA: Diagnosis not present

## 2021-12-20 DIAGNOSIS — R918 Other nonspecific abnormal finding of lung field: Secondary | ICD-10-CM | POA: Diagnosis not present

## 2021-12-20 DIAGNOSIS — K529 Noninfective gastroenteritis and colitis, unspecified: Secondary | ICD-10-CM | POA: Diagnosis not present

## 2021-12-20 DIAGNOSIS — E869 Volume depletion, unspecified: Secondary | ICD-10-CM | POA: Diagnosis not present

## 2021-12-20 DIAGNOSIS — K648 Other hemorrhoids: Secondary | ICD-10-CM | POA: Diagnosis not present

## 2021-12-20 DIAGNOSIS — M25559 Pain in unspecified hip: Secondary | ICD-10-CM | POA: Diagnosis not present

## 2021-12-20 DIAGNOSIS — Z8616 Personal history of COVID-19: Secondary | ICD-10-CM | POA: Diagnosis not present

## 2021-12-20 DIAGNOSIS — Z9989 Dependence on other enabling machines and devices: Secondary | ICD-10-CM | POA: Diagnosis not present

## 2021-12-20 DIAGNOSIS — I5032 Chronic diastolic (congestive) heart failure: Secondary | ICD-10-CM | POA: Diagnosis not present

## 2021-12-20 DIAGNOSIS — I6523 Occlusion and stenosis of bilateral carotid arteries: Secondary | ICD-10-CM | POA: Diagnosis not present

## 2021-12-20 DIAGNOSIS — N6310 Unspecified lump in the right breast, unspecified quadrant: Secondary | ICD-10-CM | POA: Diagnosis not present

## 2021-12-20 DIAGNOSIS — I080 Rheumatic disorders of both mitral and aortic valves: Secondary | ICD-10-CM | POA: Diagnosis not present

## 2021-12-20 DIAGNOSIS — Z955 Presence of coronary angioplasty implant and graft: Secondary | ICD-10-CM | POA: Diagnosis not present

## 2021-12-20 DIAGNOSIS — J439 Emphysema, unspecified: Secondary | ICD-10-CM | POA: Diagnosis not present

## 2021-12-20 DIAGNOSIS — N179 Acute kidney failure, unspecified: Secondary | ICD-10-CM | POA: Diagnosis not present

## 2021-12-20 DIAGNOSIS — N644 Mastodynia: Secondary | ICD-10-CM | POA: Diagnosis not present

## 2021-12-20 DIAGNOSIS — D849 Immunodeficiency, unspecified: Secondary | ICD-10-CM | POA: Diagnosis not present

## 2021-12-20 DIAGNOSIS — E041 Nontoxic single thyroid nodule: Secondary | ICD-10-CM | POA: Diagnosis not present

## 2021-12-20 DIAGNOSIS — E1142 Type 2 diabetes mellitus with diabetic polyneuropathy: Secondary | ICD-10-CM | POA: Diagnosis not present

## 2021-12-20 DIAGNOSIS — I10 Essential (primary) hypertension: Secondary | ICD-10-CM | POA: Diagnosis not present

## 2021-12-20 LAB — CBC W/ AUTO DIFF
BASOPHILS ABSOLUTE COUNT: 0 10*9/L (ref 0.0–0.1)
BASOPHILS RELATIVE PERCENT: 0.4 %
EOSINOPHILS ABSOLUTE COUNT: 0.2 10*9/L (ref 0.0–0.5)
EOSINOPHILS RELATIVE PERCENT: 2.2 %
HEMATOCRIT: 35.6 % (ref 34.0–44.0)
HEMOGLOBIN: 11.9 g/dL (ref 11.3–14.9)
LYMPHOCYTES ABSOLUTE COUNT: 1.1 10*9/L (ref 1.1–3.6)
LYMPHOCYTES RELATIVE PERCENT: 14.5 %
MEAN CORPUSCULAR HEMOGLOBIN CONC: 33.5 g/dL (ref 32.0–36.0)
MEAN CORPUSCULAR HEMOGLOBIN: 30.9 pg (ref 25.9–32.4)
MEAN CORPUSCULAR VOLUME: 92.5 fL (ref 77.6–95.7)
MEAN PLATELET VOLUME: 9.2 fL (ref 6.8–10.7)
MONOCYTES ABSOLUTE COUNT: 0.4 10*9/L (ref 0.3–0.8)
MONOCYTES RELATIVE PERCENT: 5.1 %
NEUTROPHILS ABSOLUTE COUNT: 5.9 10*9/L (ref 1.8–7.8)
NEUTROPHILS RELATIVE PERCENT: 77.8 %
NUCLEATED RED BLOOD CELLS: 0 /100{WBCs} (ref <=4)
PLATELET COUNT: 151 10*9/L (ref 150–450)
RED BLOOD CELL COUNT: 3.85 10*12/L — ABNORMAL LOW (ref 3.95–5.13)
RED CELL DISTRIBUTION WIDTH: 17 % — ABNORMAL HIGH (ref 12.2–15.2)
WBC ADJUSTED: 7.6 10*9/L (ref 3.6–11.2)

## 2021-12-20 LAB — PHOSPHORUS: PHOSPHORUS: 3.5 mg/dL (ref 2.4–5.1)

## 2021-12-20 LAB — BASIC METABOLIC PANEL
ANION GAP: 10 mmol/L (ref 5–14)
BLOOD UREA NITROGEN: 11 mg/dL (ref 9–23)
BUN / CREAT RATIO: 10
CALCIUM: 10.1 mg/dL (ref 8.7–10.4)
CHLORIDE: 106 mmol/L (ref 98–107)
CO2: 19.6 mmol/L — ABNORMAL LOW (ref 20.0–31.0)
CREATININE: 1.15 mg/dL — ABNORMAL HIGH
EGFR CKD-EPI (2021) FEMALE: 53 mL/min/{1.73_m2} — ABNORMAL LOW (ref >=60)
GLUCOSE RANDOM: 158 mg/dL — ABNORMAL HIGH (ref 70–99)
POTASSIUM: 4.2 mmol/L (ref 3.4–4.8)
SODIUM: 136 mmol/L (ref 135–145)

## 2021-12-20 LAB — URINALYSIS WITH MICROSCOPY
BACTERIA: NONE SEEN /HPF
BILIRUBIN UA: NEGATIVE
BLOOD UA: NEGATIVE
GLUCOSE UA: NEGATIVE
KETONES UA: NEGATIVE
LEUKOCYTE ESTERASE UA: NEGATIVE
NITRITE UA: NEGATIVE
PH UA: 6.5 (ref 5.0–9.0)
RBC UA: <1 /HPF (ref <4)
SPECIFIC GRAVITY UA: 1.015 (ref 1.005–1.030)
SQUAMOUS EPITHELIAL: 1 /HPF (ref 0–5)
UROBILINOGEN UA: 0.2
WBC UA: <1 /HPF (ref 0–5)

## 2021-12-20 LAB — PROTEIN / CREATININE RATIO, URINE
CREATININE, URINE: 51.3 mg/dL
PROTEIN URINE: 47.9 mg/dL
PROTEIN/CREAT RATIO, URINE: 0.934

## 2021-12-20 LAB — ALBUMIN / CREATININE URINE RATIO
ALBUMIN QUANT URINE: 22.1 mg/dL
ALBUMIN/CREATININE RATIO: 430.8 ug/mg — ABNORMAL HIGH (ref 0.0–30.0)
CREATININE, URINE: 51.3 mg/dL

## 2021-12-20 LAB — MAGNESIUM: MAGNESIUM: 1.5 mg/dL — ABNORMAL LOW (ref 1.6–2.6)

## 2021-12-20 NOTE — Unmapped (Signed)
Saw patient in clinic. Reports she is doing well.    Appetite: poor    +nausea all day every day and only thrown up once since admitted.    +diarrhea none for 3 days. Prior to that some episodes last all day    +drinking 3 bottles water and half a pepsi everyday    BP avg at home 120-145/70-80    Sleep: not good    +dizziness with standing too fast    +left breast pain    +SOB with exertion    +still smoking cig and marijuana    +numbness/tingling same    Denies HA, CP, heart palpitations, abdominal pain, urinary problems, tremors or fevers    Labs today and took envarsus 9am yesterday

## 2021-12-20 NOTE — Unmapped (Signed)
duplicate

## 2021-12-20 NOTE — Unmapped (Unsigned)
Transplant Nephrology Clinic Visit    Assessment/Plan:  Michele Cisneros is a 66 y.o. female with DM, HTN, CAD, multivalvular heart disease, and COPD and is s/p deceased donor kidney transplant on 03/30/2019 for native kidney ESRD presumed secondary to diabetic nephropathy. She is seen today after recent hospitalization for volume depletion associated AKI. Issues addressed today include:      Chronic diarrhea, nausea, vomiting  - BMI now 22.71 kg/m2  - Hospitalized 11/2021 due to volume depletion  - No GI pathogen panel was done after diarrhea resolved.  - CMV viral load was undetectable.   - EGD and colonoscopy in 09/2021 revealed no cause for symptoms  - Symptoms have improved off metformin and after replacing Myfortic with  azathioprine    S/P deceased donor kidney transplant with acute allograft dysfunction   - Serum creatinine is 1.15 mg/dL and has returned to her baseline range of 0.9-1.4 mg/dL.   - The recent AKI was likely related to volume depletion      - She has not history of rejection, proteinuria, or presence of DSAs.   - Renal US 11/27/21 revealed no pathology of transplant kidney     Immunosuppression Management   - Tacrolimus level is 4.1 (target 4-7)    - Will continue Envarsus XR 11 mg daily    - Myfortic was stopped and azathioprine started due to GI symptoms.      History of recurrent post-transplant UTI:   - Most recent UTI 06/25/21 grew klebsiella and E.coli and was treated with ceftriaxone followed by cefdinir for 7 days  - Other UTIs: proteus isolated on 11/28/19, 12/26/19,02/27/20 and 03/19/20 with citrobacter isolated 03/19/20  - Has had urinary retention in the past as a risk factor.  - UA today not suggestive of UTI and she has no UTI symptoms    Coronary artery disease, s/p RCA stent 2015  - Myocardial perfusion study 06/19/21 revealed no evidence of ischemia.  - Started on isosorbide by her cardiologist on 10/14/21 for chronic stable angina  - Continue statin, aspirin, prn SL NTG, isosorbide    Chronic CHF with diastolic dysfunction and valvular heart disease  - Hospitalized for acute on chronic CHF in January 2023  - Echocardiogram 06/27/21: mild to moderate MR/MS/AI/AS  - Furosemide currently on hold due to GI symptoms.   - ACE inhibitor/ARB therapy not initiated due to hyperkalemia and recent AKI.  - May benefit from SGLT-2 inhibitor eventually if UTI risk is not deemed excessive  - She will follow up with her local Cardiologist, Dr.Gollan    Hypertension, with history of postural hypotension and syncope   - BP today in clinic 180/75. She is symptomatic upon standing today.    - The balance between symptomatic postural hypotension and recumbent or seated HTN has been challenging  - Continue amlodipine 10 mg daily, clonidine 0.1 mg nightly, and isosorbide 30 mg daily.      History of hyperkalemia/metabolic acidosis  - K 4.2 today, serum CO2 19.6.    - Will continue sodium bicarbonate    Right breast nodule  - Noted on PET CT myocardial perfusion study 06/19/21  - Mammogram 08/05/21 benign findings    Anemia, Iron Deficiency  - Hemoglobin 11.9 today.     - Iron saturation 26% on 07/24/21  - She has a history of ulcers that were healed on EGD 05/04/19 and 09/06/21  - Colonoscopy 09/06/21 with diverticulosis, internal hemorrhoids, and multiple polyps with pathology showing tubular adenomas. Prior colonoscopies also showed  tubular adenomas.   - Continue oral iron supplementation    History of CMV viremia  - Viral load negative 12/20/21   - On no therapy currently    Esophageal stenosis, history of esophageal ulcers:   - S/P dilation 07/06/19 with resolution of ulcers on EGD   - She remains on Nexium    Pulmonary Nodules with History of Smoking Associated COPD:   - Chest CT 09/12/20 revealed stable pulmonary nodules compared to 05/02/19  - Will continue annual lung cancer screening low dose CT, now due    Lumbar Radiculopathy Secondary to L5-S1 disc disease Peripheral Neuropathy secondary to DM   - Following with [facility]  - Continue Lyrica.    Thyroid Nodule:   - [place] 02/01/20 revealed no nodules suggestive of cancer     History of DM, type II, controlled without current therapy:  - A1C 5.3 on 11/27/21  - SGLT-2 inhibitor may be indicated though some concern due to recurrent UTIs.  - She is unlikely to tolerate a GLP-1 receptor agonist due to GI concerns.    - Metformin stopped due to GI symptoms    COVID-19 infection, confirmed 07/01/21  - Infection was presumed obtained in the hospital and not the cause for hospitalization.  - S/P treatment with remdesivir.  - Had received 5 doses of COVID vaccine including a bivalent booster prior to her illness.  - No complications or long-COVID symptoms.     Health Maintenance:   Breast Cancer Screening: Chest CT (09/12/20) with asymmetry of breast tissue and PET CT myocardial perfusion study 06/19/21 showed right breast 1.4 cm lesion. Mammogram 08/05/21 with benign findings .   Pap Smear: s/p hysterectomy  Colonoscopy: Has history of multiple tubular adenomas on multiple colonoscopies including several removed in March 2023. No high grade atypia noted.   Lung cancer screening: annual low dose chest CT, last study 09/12/20, now due  Renal US: native kidney imaging now due    Immunizations/Infection Prevention:   - Flu: 04/11/21  - Prevnar 13: not noted in records.    - Pneumovax: 03/23/18  - COVID-19: received second bivalent booster today. She is aware of limited efficacy of the vaccine in immunosuppressed patients.    - Shingrix now due    Follow-Up:   - 3 months  - Cardiology follow up with Dr. Mariah Milling  - Needs screening renal US and chest CT   - Endocrinology follow up     Interval History:   She was hospitalized at [facility] 06/15/21-06/28/21 with acute on chronic heart failure, anemia, UTI and COVID-19 infection.CXR revealed pulmonary edema and BNP was elevated at 894. Myocardial perfusion study revealed no evidence of ischemia with EF >60%. TEE revealed EF 60-65%, mild to moderate MR/MS/AR/AS. She was treated with IV furosemide and required a Foley catheter placement after developing urinary retention of 1277 ml of urine. Though she was initially COVID-19 negative on presentation COVID-19 was diagnosed on 06/21/21. She was treated with remdesivir. Serum creatinine peaked at 1.7 mg/dL and was 1.61 mg/dL on the day of discharge after furosemide induced diuresis. Urine culture grew klebsiella and E.coli and she was treated with ceftriaxone followed by cefdinir for a 7 day course. A right breast nodule was noted on her PET CT scan. Hemoglobin was 8.7 on admission and reached a nadir of 7.1 on 06/18/21. She was started on oral iron after iron saturation % was found to be 13%, She was discharged on 06/28/21 without a Foley after a successul trial  of void. Her Envarsus dose was increased to 11 mg daily based on inpatient levels.     She saw her cardiologist, Dr. Mariah Milling, in clinic on 10/14/21 and was started on isosorbide for chest pain felt to be due to chronic stable angina. Clonidine was decreased to 0.1 mg bid. She had an EGD and colonoscopy in 08/2021 revealing multiple tubular adenomaa, diverticulosis, and internal hemorrhoids. She had mammogram with benign findings on 08/05/21.     She was hospitalized 11/27/21-11/30/21 after presenting with nausea, vomiting, diarrhea, weakness, and dizziness upon standing. Though diarrhea and dizziness are chronic problems they had recently worsened and PO intake was poor. Her gastroenterologist started her on Linzess, but she only took 1 dose due to GI intolerance. EGD and colonoscopy had been done on 09/10/21 with findings of tubular adenomas in the colon on no pathology on EGD.     Today she states her appetite is still poor with nausea and rare emesis. Diarrhea is  less frequent and PO intake better since she stopped metformin and changed from Myfortic to azathioprine. Blood glucoses have been well controlled with recent A1c of 5.9. BP has been 120-140/70-80 and she is taking clonidine 0.2 mg at night only and not in the morning due to drowsiness. She continues to smoke cigarettes and marijuana. She is complaining of left breast pain. DOE is still present. She denies edema and is no longer taking furosemide.     Last dose of Envarsus: 9 AM yesterday    Transplant History:    Native Kidney Disease: Diabetic nephropathy   Pre-transplant PRA: 70%   Organ Received: DDKT, DBD, KDPI: 70%  CMV/EBV Status: CMV D-/R+  EBV D+/R+  Resp culture: + strep pneumo  Date of Transplant: 03/30/2019  Implantation biopsy: mild ATN, otherwise normal.  Post-transplant complication - DGF  Induction: Campath with rapid steroid discontinuation  Maintenance Immunosuppression on discharge: Tacrolimus/Myfotic  Date of Ureteral Stent Removal: 06/13/2019  Current Immunosuppression: Tacrolimus monotherapy    Other Past Medical History  1. Diabetes mellitus, type 2, diagnosed 2012.   2. Diabetic peripheral neuropathy  3. Diabetic retinopathy, s/p laser surgery, s/p retinal detachment  4. Hypertension, diagnosed 2012.   5. Mixed hyperlipidemia.   6. Coronary artery disease, s/p NSTEMI 05/2013, PCI RCA, 20% KNm 80% PDA 1. Cardiac cath 05/02/15 LAD 20% and 30% lesions, Cx 35%, RPDA 70%, patent right coronary stent  7. HFpEF, echocardiogram 06/17/21 with EF 60-65%, mild to moderate MR, MS, aortic regurgitation, and aortic stenosis with mild pulmonary HTN  8. COPD, emphysema secondary to smoking  9. Peripheral artery disease  10. Carotid artery stenosis 1-39% bilateral stenosis 12/2017  10. Sickle Cell Trait  11. S/P TAH/BSO 2000  12. Lumbar radiculopathy secondary to L5-S1 disc bulge   13. History of thyroid nodule  14. Colonic polyps removed 08/10/17, pathology tubular adenomas, follow up colonoscopy 09/06/21 with multiple tubular adenomas without cellular atypia  15. Right breast soft tissue mass noted 07/07/17 stable in appearance compared to past imaging. Last mammogram 08/05/21 with benign calcifications.  16. Pulmonary nodule noted 07/07/17, 05/02/19, and 09/12/20 similar in size to lesion as far back as 2015.   17. OSA on CPAP  18. Diverticulosis    Review of Systems    Otherwise as per HPI, all other systems reviewed and are negative.    Medications  Current Outpatient Medications   Medication Sig Dispense Refill   ??? amLODIPine (NORVASC) 10 MG tablet Take 1 tablet (10 mg total) by mouth daily.  30 tablet 11   ??? aspirin (ECOTRIN) 81 MG tablet Take 1 tablet (81 mg total) by mouth daily.     ??? atorvastatin (LIPITOR) 20 MG tablet Take 1 tablet (20 mg total) by mouth daily. 90 tablet 3   ??? azaTHIOprine (IMURAN) 50 mg tablet Take 2 tablets (100 mg total) by mouth daily. 180 tablet 1   ??? blood sugar diagnostic (CONTOUR TEST STRIPS) Strp by Other route two (2) times a day. TEST BLOOD SUGARS 2 TIMES DAILY 60 strip 5   ??? blood-glucose meter kit Please dispense for patient to use at home to check blood sugars.     ??? cloNIDine HCL (CATAPRES) 0.1 MG tablet Take 2 tablets (0.2 mg total) by mouth Two (2) times a day. 120 tablet 11   ??? ergocalciferol-1,250 mcg, 50,000 unit, (VITAMIN D2-1,250 MCG, 50,000 UNIT,) 1,250 mcg (50,000 unit) capsule Take 1 capsule (1,250 mcg total) by mouth once a week. Take for 8 weeks as directed. 4 capsule 2   ??? esomeprazole (NEXIUM) 20 MG capsule Take 1 capsule (20 mg total) by mouth daily. 90 capsule 3   ??? ferrous sulfate 325 (65 FE) MG EC tablet Take 1 tablet (325 mg total) by mouth every other day. 15 tablet 11   ??? isosorbide mononitrate (IMDUR) 30 MG 24 hr tablet Take 1 tablet (30 mg total) by mouth daily.     ??? LANCETS,THIN MISC Using at home to test blood sugars twice daily.     ??? loratadine (CLARITIN) 10 mg tablet Take 1 tablet (10 mg total) by mouth daily.     ??? magnesium oxide (MAG-OX) 400 mg (241.3 mg elemental magnesium) tablet Take 1 tablet (400 mg total) by mouth.     ??? pregabalin (LYRICA) 75 MG capsule Take 1 capsule (75 mg total) by mouth every morning AND 2 capsules (150 mg total) nightly. 90 capsule 3   ??? sodium bicarbonate 650 mg tablet Take 1 tablet (650 mg total) by mouth Two (2) times a day. 60 tablet 11   ??? tacrolimus (ENVARSUS XR) 1 mg Tb24 extended release tablet Take 3 (1mg ) tablet with 2 (4mg ) tablets every morning to equal 11mg  daily dose 270 tablet 3   ??? tacrolimus (ENVARSUS XR) 4 mg Tb24 extended release tablet Take 2 tablets (8 mg total) by mouth daily. Take 2 (4mg ) tablets with 3 (1mg ) tablet every morning to equal 11mg  daily dose 180 tablet 3   ??? acetaminophen (TYLENOL) 500 MG tablet Take 1-2 tablets (500-1,000 mg total) by mouth every six (6) hours as needed for pain or fever (> 38C or 100.102F). (Patient not taking: Reported on 12/20/2021) 30 tablet 0   ??? albuterol (PROVENTIL HFA;VENTOLIN HFA) 90 mcg/actuation inhaler Inhale 2 puffs every six (6) hours as needed for wheezing. (Patient not taking: Reported on 12/20/2021)     ??? albuterol 2.5 mg /3 mL (0.083 %) nebulizer solution Inhale 3 mL (2.5 mg total). (Patient not taking: Reported on 12/20/2021)     ??? albuterol HFA 90 mcg/actuation inhaler Inhale. (Patient not taking: Reported on 12/20/2021)     ??? furosemide (LASIX) 40 MG tablet Take 1 tablet (40 mg total) by mouth Two (2) times a day. (Patient not taking: Reported on 12/20/2021) 60 tablet 11   ??? nitroglycerin (NITROSTAT) 0.4 MG SL tablet Place 1 tablet (0.4 mg total) under the tongue daily as needed. (Patient not taking: Reported on 12/20/2021)     ??? pramipexole (MIRAPEX) 0.125 MG tablet Take 1 tablet (0.125 mg  total) by mouth. (Patient not taking: Reported on 12/20/2021)     ??? rosuvastatin (CRESTOR) 40 MG tablet  (Patient not taking: Reported on 12/20/2021)     ??? sodium zirconium cyclosilicate (LOKELMA) 5 gram PwPk packet Take by mouth. (Patient not taking: Reported on 12/20/2021)       No current facility-administered medications for this visit.           Physical Exam  BP 180/75 (BP Site: R Arm, BP Position: Sitting, BP Cuff Size: Medium)  - Pulse 85  - Temp 35.9 ??C (96.6 ??F) (Temporal)  - Ht 170.2 cm (5' 7)  - Wt 65.8 kg (145 lb)  - BMI 22.71 kg/m??   General: Patient is a pleasant female in no apparent distress.  Eyes: Sclera anicteric.  Neck: Supple without LAD/JVD/bruits.  Lungs: Clear to auscultation bilaterally, no wheezes/rales/rhonchi.  Cardiovascular:  grade 2/6 systolic murmur .  Abdomen: Soft, notender/nondistended. Positive bowel sounds. No hepatosplenomegaly, masses or bruits appreciated.  Extremities: no edema  Skin: Without rash  Neurological:  ataxic gait, reduced strength in legs, symmetric .  Psychiatric: Mood and affect appropriate.    Laboratory Data and Imaging Reviewed 7)  - Wt 65.8 kg (145 lb)  - BMI 22.71 kg/m??   General: Patient is a pleasant female in no apparent distress.  Eyes: Sclera anicteric.  Neck: Supple without LAD/JVD/bruits.  Lungs: Clear to auscultation bilaterally, no wheezes/rales/rhonchi.  Cardiovascular:  grade 2/6 systolic murmur .  Abdomen: Soft, notender/nondistended. Positive bowel sounds. No hepatosplenomegaly, masses or bruits appreciated.  Extremities: no edema  Skin: Without rash  Neurological:  ataxic gait, reduced strength in legs, symmetric .  Psychiatric: Mood and affect appropriate.    Laboratory Data and Imaging Reviewed

## 2021-12-21 LAB — TACROLIMUS LEVEL: TACROLIMUS BLOOD: 4.1 ng/mL

## 2021-12-23 LAB — CMV DNA, QUANTITATIVE, PCR: CMV VIRAL LD: NOT DETECTED

## 2021-12-26 LAB — BK VIRUS QUANTITATIVE PCR, BLOOD: BK BLOOD RESULT: NOT DETECTED

## 2021-12-30 DIAGNOSIS — Z940 Kidney transplant status: Principal | ICD-10-CM

## 2021-12-31 MED ORDER — ERGOCALCIFEROL (VITAMIN D2) 1,250 MCG (50,000 UNIT) CAPSULE
ORAL_CAPSULE | ORAL | 2 refills | 28 days
Start: 2021-12-31 — End: 2022-12-31

## 2021-12-31 NOTE — Unmapped (Signed)
Specialty Medication(s): mycophenolate    Ms.Taveras has been dis-enrolled from the [facility] specialty pharmacy services due to medication discontinuation - verified with patient and per provider epic notes.    Additional information provided to the patient: team member JM spoke with patient today, she verified no longer on this specialty medication. As no longer on any specialty medications at [facility], disenrolled from outbound calls per protocol. Pt is aware to call in her own maintenance medication refills going forward. Clinic is aware.    Thad Ranger  [facility] Specialty Pharmacist

## 2021-12-31 NOTE — Unmapped (Signed)
Pt request for RX Refill

## 2022-01-01 MED FILL — ATORVASTATIN 20 MG TABLET: ORAL | 90 days supply | Qty: 90 | Fill #3

## 2022-01-01 MED FILL — PREGABALIN 75 MG CAPSULE: ORAL | 30 days supply | Qty: 90 | Fill #3

## 2022-01-01 MED FILL — AMLODIPINE 10 MG TABLET: ORAL | 30 days supply | Qty: 30 | Fill #4

## 2022-01-01 MED FILL — SODIUM BICARBONATE 650 MG TABLET: ORAL | 30 days supply | Qty: 60 | Fill #9

## 2022-01-01 MED FILL — ESOMEPRAZOLE MAGNESIUM 20 MG CAPSULE,DELAYED RELEASE: ORAL | 90 days supply | Qty: 90 | Fill #3

## 2022-01-03 MED ORDER — ERGOCALCIFEROL (VITAMIN D2) 1,250 MCG (50,000 UNIT) CAPSULE
ORAL_CAPSULE | ORAL | 2 refills | 28 days | Status: CP
Start: 2022-01-03 — End: 2023-01-03
  Filled 2022-01-03: qty 4, 28d supply, fill #0

## 2022-01-08 ENCOUNTER — Other Ambulatory Visit: Payer: Self-pay

## 2022-01-08 DIAGNOSIS — I5032 Chronic diastolic (congestive) heart failure: Secondary | ICD-10-CM

## 2022-01-08 NOTE — Patient Outreach (Addendum)
  Care Coordination   Initial Visit Note   01/08/2022 Name: Theresa [name] MRN: [id number] DOB: February 26, 1956  Theresa [name] is a 66 y.o. year old female who sees McLean-Scocuzza, Pasty Spillers, MD for primary care. I spoke with  [name] by phone today  What matters to the patients health and wellness today?  Patient states she was unable to get approved for food stamps. She states she needs food assistance. Patient states she needs dental care because she has broken teeth in her mouth.  Patient states she would like to move from her current housing area due to safety concerns.    Goals Addressed             This Visit's Progress    Patient States she needs community resource assistance for food, dental care, and housing       Care Coordination Interventions: Evaluation of current treatment plan related to food, dental care and housing. Discussed plans with patient for ongoing care management follow up and provided patient with direct contact information for care management team Referred patient to community resource care guides           SDOH assessments and interventions completed:  Yes  SDOH Interventions Today    Flowsheet Row Most Recent Value  SDOH Interventions   Food Insecurity Interventions Other (Comment)  [referred to community resource care guides.]  Housing Interventions Other (Comment)  [referred to community resource care guides.]  Transportation Interventions Intervention Not Indicated        Care Coordination Interventions Activated:  Yes  Care Coordination Interventions:  Yes, provided   Follow up plan: Follow up call scheduled for 01/30/22 at 10:00 am  Referral made to care guides for community resources.   Encounter Outcome:  Pt. Scheduled   George Ina RN,BSN,CCM RN Care Manager Coordinator [phone]

## 2022-01-08 NOTE — Patient Instructions (Signed)
Visit Information  Thank you for taking time to visit with me today. Please don't hesitate to contact me if I can be of assistance to you.   Following are the goals we discussed today:   Goals Addressed             This Visit's Progress    Patient States she needs community resource assistance for food, dental care, and housing       Care Coordination Interventions: Evaluation of current treatment plan related to food, dental care and housing. Discussed plans with patient for ongoing care management follow up and provided patient with direct contact information for care management team Referred patient to community resource care guides           Our next appointment is by telephone on 01/30/22 at 10:00 am  Please call the care guide team at [phone] if you need to cancel or reschedule your appointment.   If you are experiencing a Mental Health or [place] or need someone to talk to, please call the Suicide and Crisis Lifeline: 988 call 1-800-273-TALK (toll free, 24 hour hotline)  The patient verbalized understanding of instructions, educational materials, and care plan provided today and agreed to receive a mailed copy of patient instructions, educational materials, and care plan.   Quinn Plowman RN,BSN,CCM RN Care Manager Coordinator [phone]

## 2022-01-14 ENCOUNTER — Telehealth: Payer: Self-pay | Admitting: *Deleted

## 2022-01-14 NOTE — Telephone Encounter (Signed)
   Telephone encounter was:  Successful.  01/14/2022 Name: Theresa [name] MRN: [id number] DOB: 1956-04-13  Theresa [name] is a 66 y.o. year old female who is a primary care patient of McLean-Scocuzza, Nino Glow, MD . The community resource team was consulted for assistance with [place] guide performed the following interventions: Patient provided with information about care guide support team and interviewed to confirm resource needs. patient on her way out the door she said to call back tomorrow  Follow Up Plan:  Care guide will follow up with patient by phone over the next day  [place] [address] , [place] [address] Email : [email or web address]

## 2022-01-15 ENCOUNTER — Telehealth: Payer: Self-pay | Admitting: *Deleted

## 2022-01-15 NOTE — Telephone Encounter (Signed)
   Telephone encounter was:  Unsuccessful.  01/15/2022 Name: Theresa [name] MRN: [id number] DOB: 01-14-1956  Unsuccessful outbound call made today to assist with:  Food Insecurity  Outreach Attempt:  1st Attempt  A HIPAA compliant voice message was left requesting a return call.  Instructed patient to call back at   Instructed patient to call back at [phone]  at their earliest convenience. .  [place], Population Health [phone] [address] , [place] [address] Email : [email or web address]

## 2022-01-20 ENCOUNTER — Telehealth: Payer: Self-pay | Admitting: *Deleted

## 2022-01-20 NOTE — Telephone Encounter (Signed)
   Telephone encounter was:  Successful.  01/20/2022 Name: Theresa [name] MRN: [id number] DOB: 09/26/1955  [name] is a 66 y.o. year old female who is a primary care patient of McLean-Scocuzza, Nino Glow, MD . The community resource team was consulted for assistance with [place] guide performed the following interventions: Patient provided with information about care guide support team and interviewed to confirm resource needs Follow up call placed to community resources to determine status of patients referral. Patient provided , food stamp application , housing list and food banks . Follow Up Plan:  No further follow up planned at this time. The patient has been provided with needed resources.  [place] [phone] [address] , [place] [address] Email : [email or web address]

## 2022-01-27 ENCOUNTER — Ambulatory Visit: Payer: Self-pay

## 2022-01-27 DIAGNOSIS — Z940 Kidney transplant status: Principal | ICD-10-CM

## 2022-01-27 NOTE — Patient Instructions (Signed)
Visit Information  Thank you for taking time to visit with me today. Please don't hesitate to contact me if I can be of assistance to you.   Following are the goals we discussed today:   Goals Addressed             This Visit's Progress    Patient States she needs community resource assistance for food, dental care, and housing       Care Coordination Interventions: Discussed with patient receiving community resource follow up. Discussed plans with patient for ongoing care management follow up and provided patient with direct contact information for care management team RN to follow up with community care guide regarding referral for dental resources          Our next appointment is by telephone on 02/13/22 at 11:00 am  Please call the care guide team at [phone] if you need to cancel or reschedule your appointment.   If you are experiencing a Mental Health or [place] or need someone to talk to, please call the Suicide and Crisis Lifeline: 988 call 1-800-273-TALK (toll free, 24 hour hotline)  The patient verbalized understanding of instructions, educational materials, and care plan provided today and DECLINED offer to receive copy of patient instructions, educational materials, and care plan.   The care management team will reach out to the patient again over the next 3 weeks.  Quinn Plowman RN,BSN,CCM RN Care Manager Coordinator [phone]

## 2022-01-27 NOTE — Patient Outreach (Signed)
  Care Coordination   Follow Up Visit Note   01/27/2022 Name: Theresa [name] MRN: [id number] DOB: Dec 16, 1955  Theresa [name] is a 66 y.o. year old female who sees McLean-Scocuzza, Nino Glow, MD for primary care. I spoke with  [name] by phone today  What matters to the patients health and wellness today?  Patient reports receiving community resource assistance for food and housing. She states will be picking up a food box on 01/30/22 and once she signs up she will be able to access any food back in [employer]. She states she also received a list of housing / room options.  Patient states she has not received any information regarding her dental assistance request.    Goals Addressed             This Visit's Progress    Patient States she needs community resource assistance for food, dental care, and housing       Care Coordination Interventions: Discussed with patient receiving community resource follow up. Discussed plans with patient for ongoing care management follow up and provided patient with direct contact information for care management team RN to follow up with community care guide regarding referral for dental resources          SDOH assessments and interventions completed:  No     Care Coordination Interventions Activated:  Yes  Care Coordination Interventions:  Yes, provided   Follow up plan: Follow up call scheduled for 02/13/22 at 11:00 am    Encounter Outcome:  Pt. Scheduled   Quinn Plowman RN,BSN,CCM RN Care Manager Coordinator [phone]

## 2022-01-28 DIAGNOSIS — Z940 Kidney transplant status: Principal | ICD-10-CM

## 2022-02-03 DIAGNOSIS — Z940 Kidney transplant status: Secondary | ICD-10-CM | POA: Diagnosis not present

## 2022-02-06 MED ORDER — ESOMEPRAZOLE MAGNESIUM 20 MG CAPSULE,DELAYED RELEASE
ORAL_CAPSULE | Freq: Every day | ORAL | 3 refills | 90 days | Status: CP
Start: 2022-02-06 — End: 2023-02-06
  Filled 2022-04-21: qty 90, 90d supply, fill #0

## 2022-02-10 MED FILL — ERGOCALCIFEROL (VITAMIN D2) 1,250 MCG (50,000 UNIT) CAPSULE: ORAL | 28 days supply | Qty: 4 | Fill #1

## 2022-02-10 MED FILL — AMLODIPINE 10 MG TABLET: ORAL | 30 days supply | Qty: 30 | Fill #5

## 2022-02-10 MED FILL — SODIUM BICARBONATE 650 MG TABLET: ORAL | 30 days supply | Qty: 60 | Fill #10

## 2022-02-13 ENCOUNTER — Telehealth: Payer: Self-pay

## 2022-02-13 ENCOUNTER — Ambulatory Visit: Payer: Self-pay

## 2022-02-13 DIAGNOSIS — Z139 Encounter for screening, unspecified: Secondary | ICD-10-CM

## 2022-02-13 DIAGNOSIS — I5032 Chronic diastolic (congestive) heart failure: Secondary | ICD-10-CM

## 2022-02-13 NOTE — Telephone Encounter (Signed)
   Telephone encounter was:  Unsuccessful.  02/13/2022 Name: BAYLI QUESINBERRY MRN: [id number] DOB: 03-May-1956  Unsuccessful outbound call made today to assist with:  Transportation Needs  and dental  Outreach Attempt:  1st Attempt  A HIPAA compliant voice message was left requesting a return call.  Instructed patient to call back at [phone].  [place] Resource Care Guide   [email or web address]  ?? [phone]   Website: [email or web address]  [email or web address]  "We don't say no, we SHOW how!"         [facility]

## 2022-02-13 NOTE — Patient Outreach (Addendum)
  Care Coordination   Follow Up Visit Note   02/13/2022 Name: Theresa [name] MRN: [id number] DOB: 1956/05/24  Theresa [name] is a 66 y.o. year old female who sees McLean-Scocuzza, Nino Glow, MD for primary care. I spoke with  [name] by phone today.  What matters to the patients health and wellness today?  Patient states she was unable to pick up food box because she was unable to secure transportation.  Patient states she has not received any information regarding dental resources.     Goals Addressed             This Visit's Progress    Patient States she needs community resource assistance for food, dental care, and housing. transportation       Care Coordination Interventions: Discussed with patient receiving community resource follow up. Discussed plans with patient for ongoing care management follow up and provided patient with direct contact information for care management team RN to follow up with community care guide regarding referral for dental resources and transportation          SDOH assessments and interventions completed:  Yes  SDOH Interventions Today    Flowsheet Row Most Recent Value  SDOH Interventions   Transportation Interventions Other (Comment)  [referral to community resource guide for transportation assistance.]        Care Coordination Interventions Activated:  Yes  Care Coordination Interventions:  Yes, provided   Follow up plan: Follow up call scheduled for 03/20/22 at 11:00 am    Encounter Outcome:  Pt. Visit Completed   Quinn Plowman RN,BSN,CCM [place] Coordinator [phone]

## 2022-02-13 NOTE — Patient Instructions (Signed)
Visit Information  Thank you for taking time to visit with me today. Please don't hesitate to contact me if I can be of assistance to you.   Following are the goals we discussed today:   Goals Addressed             This Visit's Progress    Patient States she needs community resource assistance for food, dental care, and housing. transportation       Care Coordination Interventions: Discussed with patient receiving community resource follow up. Discussed plans with patient for ongoing care management follow up and provided patient with direct contact information for care management team RN to follow up with community care guide regarding referral for dental resources and transportation          Our next appointment is by telephone on 03/20/22 at 11:00am  Please call the care guide team at [phone] if you need to cancel or reschedule your appointment.   If you are experiencing a Mental Health or [place] or need someone to talk to, please call 1-800-273-TALK (toll free, 24 hour hotline)  The patient verbalized understanding of instructions, educational materials, and care plan provided today and DECLINED offer to receive copy of patient instructions, educational materials, and care plan.   Quinn Plowman RN,BSN,CCM RN Care Manager Coordinator [phone]

## 2022-02-17 ENCOUNTER — Telehealth: Payer: Self-pay

## 2022-02-17 MED ORDER — PREGABALIN 75 MG CAPSULE
ORAL_CAPSULE | ORAL | 3 refills | 30 days
Start: 2022-02-17 — End: ?

## 2022-02-17 NOTE — Telephone Encounter (Signed)
   Telephone encounter was:  Unsuccessful.  02/17/2022 Name: Theresa [name] MRN: [id number] DOB: 11-19-1955  Unsuccessful outbound call made today to assist with:  Transportation Needs  and dental.  Outreach Attempt:  2nd Attempt  A HIPAA compliant voice message was left requesting a return call.  Instructed patient to call back at [phone].  [place] Resource Care Guide   ??[email or web address]  ?? [phone]   Website: [email or web address]  "We don't say no, we SHOW how!"         [facility]

## 2022-02-19 MED ORDER — PREGABALIN 75 MG CAPSULE
ORAL_CAPSULE | ORAL | 3 refills | 30 days | Status: CP
Start: 2022-02-19 — End: ?
  Filled 2022-02-19: qty 90, 30d supply, fill #0

## 2022-02-20 ENCOUNTER — Telehealth: Payer: Self-pay

## 2022-02-20 ENCOUNTER — Ambulatory Visit: Payer: Medicare Other | Admitting: Podiatry

## 2022-02-20 NOTE — Telephone Encounter (Signed)
   Telephone encounter was:  Successful.  02/20/2022 Name: Theresa [name] MRN: [id number] DOB: 1956-03-11  Theresa [name] is a 66 y.o. year old female who is a primary care patient of McLean-Scocuzza, Nino Glow, MD . The community resource team was consulted for assistance with Transportation Needs  and dental.  Care guide performed the following interventions: Spoke with patient verified email and home  address sent information for [employer] and dental resources. Also mailed information to home address.  Patient has my name and number to call if she does not receive the emailed and mailed resources.  Follow Up Plan:  No further follow up planned at this time. The patient has been provided with needed resources.  [place] Resource Care Guide   ??[email or web address]  ?? [phone]   Website: [email or web address]  [email or web address]  "We don't say no, we SHOW how!"         [facility]

## 2022-02-24 DIAGNOSIS — Z940 Kidney transplant status: Principal | ICD-10-CM

## 2022-02-24 MED ORDER — TACROLIMUS XR 4 MG TABLET,EXTENDED RELEASE 24 HR
ORAL_TABLET | Freq: Every day | ORAL | 3 refills | 90 days | Status: CP
Start: 2022-02-24 — End: ?

## 2022-02-24 MED ORDER — TACROLIMUS XR 1 MG TABLET,EXTENDED RELEASE 24 HR
ORAL_TABLET | Freq: Every day | ORAL | 3 refills | 90 days | Status: CP
Start: 2022-02-24 — End: 2023-02-24

## 2022-02-25 DIAGNOSIS — Z940 Kidney transplant status: Principal | ICD-10-CM

## 2022-03-04 DIAGNOSIS — Z940 Kidney transplant status: Secondary | ICD-10-CM | POA: Diagnosis not present

## 2022-03-13 ENCOUNTER — Ambulatory Visit: Payer: Medicare Other | Admitting: Podiatry

## 2022-03-13 ENCOUNTER — Encounter: Payer: Self-pay | Admitting: Podiatry

## 2022-03-13 DIAGNOSIS — I739 Peripheral vascular disease, unspecified: Secondary | ICD-10-CM

## 2022-03-13 DIAGNOSIS — M79675 Pain in left toe(s): Secondary | ICD-10-CM | POA: Diagnosis not present

## 2022-03-13 DIAGNOSIS — E1142 Type 2 diabetes mellitus with diabetic polyneuropathy: Secondary | ICD-10-CM

## 2022-03-13 DIAGNOSIS — L608 Other nail disorders: Secondary | ICD-10-CM

## 2022-03-13 DIAGNOSIS — B351 Tinea unguium: Secondary | ICD-10-CM | POA: Diagnosis not present

## 2022-03-13 DIAGNOSIS — M79674 Pain in right toe(s): Secondary | ICD-10-CM

## 2022-03-13 NOTE — Progress Notes (Signed)
This patient returns to my office for at risk foot care.  This patient requires this care by a professional since this patient will be at risk due to having kidney transplant, PAD, and diabetes.  This patient is unable to cut nails herself since the patient cannot reach her nails.These nails are painful walking and wearing shoes.  This patient presents for at risk foot care today.  General Appearance  Alert, conversant and in no acute stress.  Vascular  Dorsalis pedis and posterior tibial  pulses are  weakly palpable  right foot.  Pulses are absent left foot.  Capillary return is within normal limits  bilaterally. Temperature is within normal limits  bilaterally.  Neurologic  Senn-Weinstein monofilament wire test within normal limits  bilaterally. Muscle power within normal limits bilaterally.  Nails Thick disfigured discolored nails with subungual debris  from hallux to fifth toes bilaterally. No evidence of bacterial infection or drainage bilaterally.  Orthopedic  No limitations of motion  feet .  No crepitus or effusions noted.  No bony pathology or digital deformities noted.  HAV  B/L.  Skin  normotropic skin with no porokeratosis noted bilaterally.  No signs of infections or ulcers noted.     Onychomycosis  Pain in right toes  Pain in left toes  Consent was obtained for treatment procedures.   Mechanical debridement of nails 1-5  bilaterally performed with a nail nipper.  Filed with dremel without incident.    Return office visit   3 months.                  Told patient to return for periodic foot care and evaluation due to potential at risk complications.   Gardiner Barefoot DPM

## 2022-03-14 MED ORDER — SODIUM BICARBONATE 650 MG TABLET
ORAL_TABLET | Freq: Two times a day (BID) | ORAL | 11 refills | 30 days
Start: 2022-03-14 — End: 2023-03-14

## 2022-03-17 MED ORDER — SODIUM BICARBONATE 650 MG TABLET
ORAL_TABLET | Freq: Two times a day (BID) | ORAL | 11 refills | 30 days | Status: CP
Start: 2022-03-17 — End: 2023-03-17
  Filled 2022-03-17: qty 60, 30d supply, fill #0

## 2022-03-17 MED FILL — ERGOCALCIFEROL (VITAMIN D2) 1,250 MCG (50,000 UNIT) CAPSULE: ORAL | 28 days supply | Qty: 4 | Fill #2

## 2022-03-17 MED FILL — AMLODIPINE 10 MG TABLET: ORAL | 30 days supply | Qty: 30 | Fill #6

## 2022-03-17 MED FILL — PREGABALIN 75 MG CAPSULE: ORAL | 30 days supply | Qty: 90 | Fill #1

## 2022-03-18 DIAGNOSIS — N186 End stage renal disease: Principal | ICD-10-CM

## 2022-03-18 DIAGNOSIS — R7989 Other specified abnormal findings of blood chemistry: Principal | ICD-10-CM

## 2022-03-18 DIAGNOSIS — Z940 Kidney transplant status: Principal | ICD-10-CM

## 2022-03-20 ENCOUNTER — Ambulatory Visit: Payer: Self-pay

## 2022-03-20 NOTE — Patient Outreach (Signed)
  Care Coordination   03/20/2022 Name: Theresa [name] MRN: [id number] DOB: [birth year]   Care Coordination Outreach Attempts:  An unsuccessful telephone outreach was attempted for a scheduled appointment today.  Follow Up Plan:  Additional outreach attempts will be made to offer the patient care coordination information and services.   Encounter Outcome:  No Answer  Care Coordination Interventions Activated:  No   Care Coordination Interventions:  No, not indicated    Quinn Plowman RN,BSN,CCM [place] [phone] direct line

## 2022-03-21 ENCOUNTER — Ambulatory Visit: Admit: 2022-03-21 | Discharge: 2022-03-22 | Payer: MEDICARE | Attending: Nephrology | Primary: Nephrology

## 2022-03-21 ENCOUNTER — Ambulatory Visit: Admit: 2022-03-21 | Discharge: 2022-03-22 | Payer: MEDICARE

## 2022-03-21 DIAGNOSIS — Z940 Kidney transplant status: Principal | ICD-10-CM

## 2022-03-21 DIAGNOSIS — N186 End stage renal disease: Principal | ICD-10-CM

## 2022-03-21 DIAGNOSIS — R7989 Other specified abnormal findings of blood chemistry: Principal | ICD-10-CM

## 2022-03-21 DIAGNOSIS — I739 Peripheral vascular disease, unspecified: Secondary | ICD-10-CM | POA: Diagnosis not present

## 2022-03-21 DIAGNOSIS — Z23 Encounter for immunization: Secondary | ICD-10-CM | POA: Diagnosis not present

## 2022-03-21 DIAGNOSIS — I340 Nonrheumatic mitral (valve) insufficiency: Secondary | ICD-10-CM | POA: Diagnosis not present

## 2022-03-21 DIAGNOSIS — I10 Essential (primary) hypertension: Secondary | ICD-10-CM | POA: Diagnosis not present

## 2022-03-21 DIAGNOSIS — D849 Immunodeficiency, unspecified: Secondary | ICD-10-CM | POA: Diagnosis not present

## 2022-03-21 DIAGNOSIS — E782 Mixed hyperlipidemia: Secondary | ICD-10-CM | POA: Diagnosis not present

## 2022-03-21 DIAGNOSIS — Z6822 Body mass index (BMI) 22.0-22.9, adult: Secondary | ICD-10-CM | POA: Diagnosis not present

## 2022-03-21 MED ORDER — TACROLIMUS XR 1 MG TABLET,EXTENDED RELEASE 24 HR
ORAL_TABLET | Freq: Every day | ORAL | 3 refills | 90 days | Status: CP
Start: 2022-03-21 — End: 2023-03-21

## 2022-03-21 MED ORDER — TACROLIMUS XR 4 MG TABLET,EXTENDED RELEASE 24 HR
ORAL_TABLET | Freq: Every day | ORAL | 3 refills | 90 days | Status: CP
Start: 2022-03-21 — End: ?

## 2022-03-24 ENCOUNTER — Telehealth: Payer: Self-pay | Admitting: *Deleted

## 2022-03-24 DIAGNOSIS — Z940 Kidney transplant status: Principal | ICD-10-CM

## 2022-03-24 NOTE — Chronic Care Management (AMB) (Signed)
  Care Coordination Note  03/24/2022 Name: Theresa [name] MRN: [id number] DOB: 05-Sep-1955  Theresa [name] is a 66 y.o. year old female who is a primary care patient of McLean-Scocuzza, Nino Glow, MD and is actively engaged with the care management team. I reached out to [name] by phone today to assist with re-scheduling a follow up visit with the RN Case Manager  Follow up plan: Unsuccessful telephone outreach attempt made. A HIPAA compliant phone message was left for the patient providing contact information and requesting a return call.   Julian Hy, [place] Direct Dial: [phone]

## 2022-03-27 DIAGNOSIS — Z940 Kidney transplant status: Principal | ICD-10-CM

## 2022-03-27 MED ORDER — TACROLIMUS XR 1 MG TABLET,EXTENDED RELEASE 24 HR
ORAL_TABLET | 3 refills | 0 days | Status: CP
Start: 2022-03-27 — End: ?

## 2022-03-27 MED ORDER — TACROLIMUS XR 4 MG TABLET,EXTENDED RELEASE 24 HR
ORAL_TABLET | Freq: Every day | ORAL | 3 refills | 90 days | Status: CP
Start: 2022-03-27 — End: ?

## 2022-04-10 NOTE — Progress Notes (Deleted)
Cardiology Office Note  Date:  04/10/2022   ID:  JASNOOR TRUSSELL, DOB April 03, 1956, MRN [id number]  PCP:  McLean-Scocuzza, Nino Glow, MD   No chief complaint on file.   HPI:  [name] is a pleasant 66 -year old woman with  CAD, Drug-eluting stent placed December 2014 to the mid RCA Repeat cath 2017 for positive stress test at [facility], medical management 25 years of smoking (quit in 2013-2014),  diabetes,  chronic renal insufficiency,   moderate to severe mitral valve regurgitation on prior echocardiogram (improved to moderate MR on subsequent echo), ESRD on dialysis since February 2014, 3 days per week. 59% bilateral carotid arterial disease as of 2017  Positive stress test  April 07, 2016 at [facility] catheterization 2017 medical management recommended Renal transplant 2020 PET Myocardial Stress was performed on 06/19/2021 which showed no clear evidence of any significant ischemia She presents today for follow-up of her coronary artery disease   Last seen by myself in clinic May 2023  In the hospital emergency room December 2022 for shortness of breath  Hospital records from [facility] reviewed, long hospitalization for COVID Had cardiac work-up including echo and PET stress  In follow-up today reports having Little bit of chest tightness, used inhaler may have improved Rarely has to take nitroglycerin for chest pain but does report some chest discomfort at times  Weight trending down, down 7 pounds since August 2022 Eating less Feels blood pressure well controlled Diastolic pressure always lower since she was in the hospital in January 2023  Lab work reviewed Hemoglobin 10.2 Creatinine 1.35 BUN 25 Total cholesterol 139 LDL 52  EKG personally reviewed by myself on todays visit Normal sinus rhythm rate 80 bpm nonspecific ST and T wave abnormality  admission to [facility] chest pain  06/15/21 to 1/20 macrocytic anemia (H/H 7.9/23.8), hyperkalemia (5.5), low bicarb (19), elevate creatinine (1.24),  hypoalbuminemia (2.8), elevated BNP (854 CXR showed findings suggestive of pulmonary edema  started on lasix IV   PET Myocardial Stress was performed on 06/19/2021 which showed no clear evidence of any significant ischemia or scar, normal left ventricular systolic function (post stress ejection fraction is > 60%), coronary and aortic calcifications, small bilateral pleural effusions, trace pericardial effusion and diffuse pulmonary edema.  TTE in 05/2021 showed normal LV structure and function, grade I diastolic dysfunction, moderate LVH, and normal RV structure and function. Her TTE this hospitalization showed mildly thickened LV wall, normal LV function (LVEF >60-65%), elevated LV end-diastolic pressure, mild-to-moderate MR / MS / AR / aortic stenosis, normal RV structure and function, and mild PAH  Klebsiella / Ecoli UTI  COVID-19 - Likely contracted in hospital as she was negative on admission received a course of remdesivir   iron studies this hospitalization showed findings consistent with ACD with a % saturation of 13.  D/c  06/28/21  EKG personally reviewed by myself on todays visit Normal sinus rhythm rate 79 bpm LVH  Other past medical history reviewed Myoview January 2019 at [facility] no ischemia Echocardiogram February 2019 [facility] moderate MR, normal ejection fraction Tolerating hemodialysis Monday Wednesday Friday Echocardiogram July 2020 normal ejection fraction [facility] Apr 08, 2019 deceased donor renal transplant  hospital November 2021 Hypertensive urgency, flash pulmonary edema, mitral valve regurgitation She had stopped several of her blood pressure medications, these were restarted with improved blood pressure Now on amlodipine carvedilol clonidine Sublingual nitro for any spikes in pressure  Cardic catheterization 2017 showed 70% disease ostial right PDA, unchanged from 2014 cardiac catheterization otherwise other regions  of 20-30% disease, nonobstructive  PMH:   has a past  medical history of Anemia of chronic disease, Anginal pain ([place]), Carotid arterial disease ([place]), Chronic constipation, Chronic diastolic CHF (congestive heart failure) ([place]), Colon polyps, COPD (chronic obstructive pulmonary disease) ([place]), Coronary artery disease, COVID-19, Diabetes mellitus, Diabetic neuropathy ([place]), Diabetic retinopathy ([place]) (05/28/2013), Emphysema, ESRD on hemodialysis ([place]), History of bronchitis, History of pneumonia, History of tobacco abuse, Hyperlipidemia, Hypertension, Moderate to severe mitral insufficiency, Myocardial infarct [facility]) (05/2013), Peripheral vascular disease ([place]), Sickle cell trait ([place]), Thyroid nodule, and Valvular heart disease.  PSH:    Past Surgical History:  Procedure Laterality Date   A/V FISTULAGRAM Left 11/10/2017   Procedure: A/V FISTULAGRAM;  Surgeon: Katha Cabal, MD;  Location: [place] [facility];  Service: Cardiovascular;  Laterality: Left;   A/V SHUNTOGRAM Left 02/08/2019   Procedure: A/V SHUNTOGRAM;  Surgeon: Katha Cabal, MD;  Location: [place] [facility];  Service: Cardiovascular;  Laterality: Left;   ABDOMINAL HYSTERECTOMY     2000 for cysts on ovaries total no abnormal pap   CARDIAC CATHETERIZATION     CARDIAC CATHETERIZATION N/A 05/02/2015   Procedure: Left Heart Cath and Coronary Angiography;  Surgeon: [name], MD;  Location: [place] [facility];  Service: Cardiovascular;  Laterality: N/A;   COLONOSCOPY WITH PROPOFOL N/A 07/08/2016   Procedure: COLONOSCOPY WITH PROPOFOL;  Surgeon: [name], MD;  Location: [facility] ENDOSCOPY;  Service: Endoscopy;  Laterality: N/A;   COLONOSCOPY WITH PROPOFOL N/A 08/20/2017   Procedure: COLONOSCOPY WITH PROPOFOL;  Surgeon: [name], MD;  Location: [facility] ENDOSCOPY;  Service: Gastroenterology;  Laterality: N/A;   COLONOSCOPY WITH PROPOFOL N/A 09/06/2021   Procedure: COLONOSCOPY WITH PROPOFOL;  Surgeon: [name], MD;  Location: [facility] ENDOSCOPY;  Service: Gastroenterology;   Laterality: N/A;   colonscopy     CORONARY ANGIOPLASTY  05/28/2014   stent placement to the mid RCA   CYSTOURETHROSCOPY     [facility] urology with stent placement 11 or 05/2019 and stent removal 06/13/19    DILATION AND CURETTAGE OF UTERUS     several in the early 80's   ESOPHAGOGASTRODUODENOSCOPY     2012   ESOPHAGOGASTRODUODENOSCOPY N/A 09/06/2021   Procedure: ESOPHAGOGASTRODUODENOSCOPY (EGD);  Surgeon: [name], MD;  Location: [facility] ENDOSCOPY;  Service: Gastroenterology;  Laterality: N/A;   ESOPHAGOGASTRODUODENOSCOPY (EGD) WITH PROPOFOL N/A 03/11/2018   Procedure: ESOPHAGOGASTRODUODENOSCOPY (EGD) WITH PROPOFOL;  Surgeon: [name], MD;  Location: [facility] ENDOSCOPY;  Service: Gastroenterology;  Laterality: N/A;   EYE SURGERY     bilateral laser 2012   EYE SURGERY     right   EYE SURGERY     x4 both eyes   GAS INSERTION  09/30/2011   Procedure: INSERTION OF GAS;  Surgeon: [name], MD;  Location: [place];  Service: Ophthalmology;  Laterality: Right;  [id number]   GAS/FLUID EXCHANGE  09/30/2011   Procedure: GAS/FLUID EXCHANGE;  Surgeon: [name], MD;  Location: [place];  Service: Ophthalmology;  Laterality: Right;   KIDNEY TRANSPLANT     03/30/19 Dr. Mickel Baas Thomas/Dr. Cristie Hem Zendel/Dr. Gilford Raid; [facility] transplant Dr. Horald Chestnut will be primary [phone] coordinator Sarah Wynkoop [phone]   KIDNEY TRANSPLANT     03/30/19 [facility]   LEFT HEART CATHETERIZATION WITH CORONARY ANGIOGRAM N/A 05/28/2013   Procedure: LEFT HEART CATHETERIZATION WITH CORONARY ANGIOGRAM;  Surgeon: [name], MD;  Location: [facility] CATH LAB;  Service: Cardiovascular;  Laterality: N/A;   PARS PLANA VITRECTOMY  04/22/2011  Procedure: PARS PLANA VITRECTOMY WITH 25 GAUGE;  Surgeon: [name], MD;  Location: [place];  Service: Ophthalmology;  Laterality: Left;  membrane peel, endolaser, gas fluid exchange, silicone oil, repair of complex traction retinal detachment   PARS PLANA VITRECTOMY  09/30/2011   Procedure:  PARS PLANA VITRECTOMY WITH 25 GAUGE;  Surgeon: [name], MD;  Location: [place];  Service: Ophthalmology;  Laterality: Right;  Endolaser; Repair of Complex Traction Retinal Detachment   PARS PLANA VITRECTOMY  02/24/2012   Procedure: PARS PLANA VITRECTOMY WITH 25 GAUGE;  Surgeon: [name], MD;  Location: [place];  Service: Ophthalmology;  Laterality: Left;   PTCA     SILICON OIL REMOVAL  72/53/6644   Procedure: SILICON OIL REMOVAL;  Surgeon: [name], MD;  Location: [place];  Service: Ophthalmology;  Laterality: Left;   THROMBECTOMY / ARTERIOVENOUS GRAFT REVISION     TUBAL LIGATION     1979   UPPER EXTREMITY ANGIOGRAPHY Left 02/08/2019   Procedure: UPPER EXTREMITY ANGIOGRAPHY;  Surgeon: Katha Cabal, MD;  Location: [place] [facility];  Service: Cardiovascular;  Laterality: Left;    Current Outpatient Medications  Medication Sig Dispense Refill   acetaminophen (TYLENOL) 500 MG tablet Take 1,000 mg by mouth daily as needed.     acetaminophen (TYLENOL) 500 MG tablet Take by mouth.     albuterol (PROVENTIL) (2.5 MG/3ML) 0.083% nebulizer solution Take 3 mLs (2.5 mg total) by nebulization every 6 (six) hours as needed for wheezing or shortness of breath. 75 mL 11   albuterol (VENTOLIN HFA) 108 (90 Base) MCG/ACT inhaler Inhale 1-2 puffs into the lungs every 6 (six) hours as needed for wheezing or shortness of breath. 1 each 12   amLODipine (NORVASC) 10 MG tablet Take 1 tablet (10 mg total) by mouth daily. 90 tablet 3   aspirin EC 81 MG tablet Take 81 mg by mouth daily. Swallow whole.     atorvastatin (LIPITOR) 20 MG tablet Take 1 tablet (20 mg total) by mouth daily. 90 tablet 3   azaTHIOprine (IMURAN) 50 MG tablet SMARTSIG:2 Tablet(s) By Mouth Daily     cloNIDine (CATAPRES) 0.1 MG tablet Take 1 tablet (0.1 mg total) by mouth 2 (two) times daily. May take an extra 0.1 mg clonidine for blood pressure >160 200 tablet 3   ENVARSUS XR 1 MG TB24 Take 1 mg by mouth daily as needed.  Total dose is 9 mg every morning at 0900.     esomeprazole (NEXIUM) 20 MG capsule Take 20 mg by mouth daily.     ferrous sulfate 325 (65 FE) MG EC tablet Take 1 tablet (325 mg total) by mouth 3 (three) times daily with meals. 90 tablet 3   furosemide (LASIX) 40 MG tablet Take 1 tablet (40 mg) by mouth once daily, you may take 1 extra tablet (40 mg) once daily at lunch as needed for weight gain/ swelling 30 tablet 3   isosorbide mononitrate (IMDUR) 30 MG 24 hr tablet Take 1 tablet (30 mg total) by mouth daily. 90 tablet 3   isosorbide mononitrate (IMDUR) 30 MG 24 hr tablet      lidocaine (LIDODERM) 5 % Place onto the skin.     LOKELMA 10 g PACK packet      loratadine (CLARITIN) 10 MG tablet Take 10 mg by mouth daily.     magnesium oxide (MAG-OX) 400 MG tablet Take 400 mg by mouth 2 (two) times daily.     metFORMIN (GLUCOPHAGE)  500 MG tablet Take by mouth.     mycophenolate (MYFORTIC) 180 MG EC tablet Take by mouth.     nitroGLYCERIN (NITROSTAT) 0.4 MG SL tablet Place 1 tablet (0.4 mg total) under the tongue every 5 (five) minutes as needed for chest pain. 25 tablet 3   nitroGLYCERIN (NITROSTAT) 0.4 MG SL tablet Place under the tongue.     pregabalin (LYRICA) 75 MG capsule Take 1 capsule (75 mg total) by mouth 2 (two) times daily. 60 capsule 5   sodium bicarbonate 650 MG tablet Take 650 mg by mouth 2 (two) times daily.     sodium chloride (OCEAN) 0.65 % SOLN nasal spray Place 2 sprays into both nostrils as needed for congestion. 30 mL 2   Sodium Zirconium Cyclosilicate (LOKELMA PO) Take by mouth in the morning and at bedtime.     tacrolimus ER (ENVARSUS XR) 1 MG TB24 Take 3 mg by mouth daily.     tacrolimus ER (ENVARSUS XR) 4 MG TB24 Take 8 mg by mouth daily before breakfast. Total dose is 9 mg daily in the morning.     tacrolimus ER (ENVARSUS XR) 4 MG TB24 Take 8 mg by mouth daily.     Vitamin D, Ergocalciferol, (DRISDOL) 1.25 MG (50000 UNIT) CAPS capsule Take 50,000 Units by mouth every 7  (seven) days. Mondays     Vitamin D, Ergocalciferol, (DRISDOL) 1.25 MG (50000 UNIT) CAPS capsule Take 1 capsule (1,250 mcg total) by mouth once a week. Take for 8 weeks as directed.     No current facility-administered medications for this visit.     Allergies:   Hydrocodone and Lisinopril   Social History:  The patient  reports that she quit smoking about 3 years ago. Her smoking use included cigarettes. She has a 25.00 pack-year smoking history. She has never used smokeless tobacco. She reports current drug use. Drug: Marijuana. She reports that she does not drink alcohol.   Family History:   family history includes Breast cancer in her sister; Colon cancer in her father and sister; Diabetes in her brother, brother, and daughter; Heart attack in her brother and mother; Heart disease in her mother; Kidney failure in her daughter; Renal Disease in her daughter; Stroke in her brother and mother.    Review of Systems: Review of Systems  Constitutional:  Positive for malaise/fatigue.  HENT: Negative.    Respiratory: Negative.    Cardiovascular: Negative.   Gastrointestinal: Negative.   Musculoskeletal: Negative.   Neurological: Negative.   Psychiatric/Behavioral: Negative.    All other systems reviewed and are negative.   PHYSICAL EXAM: VS:  There were no vitals taken for this visit. , BMI There is no height or weight on file to calculate BMI. Constitutional:  oriented to person, place, and time. No distress.  HENT:  Head: Grossly normal Eyes:  no discharge. No scleral icterus.  Neck: No JVD, no carotid bruits  Cardiovascular: Regular rate and rhythm, no murmurs appreciated Pulmonary/Chest: Clear to auscultation bilaterally, no wheezes or rails Abdominal: Soft.  no distension.  no tenderness.  Musculoskeletal: Normal range of motion Neurological:  normal muscle tone. Coordination normal. No atrophy Skin: Skin warm and dry Psychiatric: normal affect, pleasant  Recent  Labs: 05/19/2021: TSH 1.976 05/21/2021: ALT 10; Magnesium 2.3 06/07/2021: BUN 14; Creatinine, Ser 1.45; Hemoglobin 8.9; Platelets 178; Potassium 5.0; Sodium 129    Lipid Panel Lab Results  Component Value Date   CHOL 161 05/22/2016   HDL 59 05/22/2016   LDLCALC 89  05/22/2016   TRIG 65 05/22/2016      Wt Readings from Last 3 Encounters:  12/18/21 145 lb 6.4 oz (66 kg)  12/13/21 151 lb (68.5 kg)  10/14/21 151 lb 8 oz (68.7 kg)     ASSESSMENT AND PLAN:  Essential (primary) hypertension -  Recommend she start isosorbide 30 mg daily for chest pain symptoms, chronic stable angina Suggested she decrease clonidine down to 0.1 mg twice daily and monitor blood pressure  Coronary artery disease involving native coronary artery of native heart with angina pectoris -  Chronic stable angina symptoms as above Start isosorbide as above Prior stress testing January 2023 with no ischemia  Mitral valve insufficiency, unspecified etiology - Previous echocardiogram September 2017 with moderate to severe MR Mild to moderate on echocardiogram at [facility] this past month Prior echoes through our system showing moderate regurg, stable  ESRD, on hemodialysis ([place]) Off HD Had transplant Followed by [facility] nephrology Remains on Lasix, renal function stable  Symptomatic anemia Hemoglobin 10  Diabetic polyneuropathy associated with type 2 diabetes mellitus (HCC) HBA1C well controlled Weight trending downward  S/P coronary artery stent placement Denies anginal symptoms  Carotid stenosis Less than 39% bilaterally Has stenosis left subclavian Quit smoking, 2014   Total encounter time more than 30 minutes  Greater than 50% was spent in counseling and coordination of care with the patient   No orders of the defined types were placed in this encounter.     Signed, Esmond Plants, M.D., Ph.D. 04/10/2022  [place], [place]

## 2022-04-11 ENCOUNTER — Ambulatory Visit: Payer: Medicare Other | Admitting: Cardiovascular Disease

## 2022-04-11 DIAGNOSIS — I6523 Occlusion and stenosis of bilateral carotid arteries: Secondary | ICD-10-CM

## 2022-04-11 DIAGNOSIS — I5032 Chronic diastolic (congestive) heart failure: Secondary | ICD-10-CM

## 2022-04-11 DIAGNOSIS — I25118 Atherosclerotic heart disease of native coronary artery with other forms of angina pectoris: Secondary | ICD-10-CM

## 2022-04-11 DIAGNOSIS — E785 Hyperlipidemia, unspecified: Secondary | ICD-10-CM

## 2022-04-11 DIAGNOSIS — I351 Nonrheumatic aortic (valve) insufficiency: Secondary | ICD-10-CM

## 2022-04-11 DIAGNOSIS — E1122 Type 2 diabetes mellitus with diabetic chronic kidney disease: Secondary | ICD-10-CM

## 2022-04-11 DIAGNOSIS — N186 End stage renal disease: Secondary | ICD-10-CM

## 2022-04-11 DIAGNOSIS — I10 Essential (primary) hypertension: Secondary | ICD-10-CM

## 2022-04-11 DIAGNOSIS — Z940 Kidney transplant status: Secondary | ICD-10-CM

## 2022-04-11 DIAGNOSIS — I739 Peripheral vascular disease, unspecified: Secondary | ICD-10-CM

## 2022-04-11 DIAGNOSIS — I340 Nonrheumatic mitral (valve) insufficiency: Secondary | ICD-10-CM

## 2022-04-14 NOTE — Progress Notes (Signed)
  Care Coordination Note  04/14/2022 Name: [name] MRN: [id number] DOB: 08/30/1955  Theresa [name] is a 66 y.o. year old female who is a primary care patient of McLean-Scocuzza, Nino Glow, MD and is actively engaged with the care management team. I reached out to [name] by phone today to assist with re-scheduling a follow up visit with the RN Case Manager  Follow up plan: 2nd Unsuccessful telephone outreach attempt made. A HIPAA compliant phone message was left for the patient providing contact information and requesting a return call.   Julian Hy, [place] Direct Dial: [phone]

## 2022-04-17 MED ORDER — ERGOCALCIFEROL (VITAMIN D2) 1,250 MCG (50,000 UNIT) CAPSULE
ORAL_CAPSULE | ORAL | 2 refills | 28 days | Status: CP
Start: 2022-04-17 — End: 2023-04-17
  Filled 2022-04-21: qty 4, 28d supply, fill #0

## 2022-04-21 ENCOUNTER — Ambulatory Visit (INDEPENDENT_AMBULATORY_CARE_PROVIDER_SITE_OTHER): Payer: Medicare Other | Admitting: Family Medicine

## 2022-04-21 ENCOUNTER — Encounter: Payer: Self-pay | Admitting: Family Medicine

## 2022-04-21 VITALS — BP 126/74 | HR 59 | Temp 97.7°F | Ht 67.5 in | Wt 144.8 lb

## 2022-04-21 DIAGNOSIS — Z940 Kidney transplant status: Principal | ICD-10-CM

## 2022-04-21 DIAGNOSIS — I38 Endocarditis, valve unspecified: Secondary | ICD-10-CM | POA: Diagnosis not present

## 2022-04-21 DIAGNOSIS — N186 End stage renal disease: Secondary | ICD-10-CM | POA: Diagnosis not present

## 2022-04-21 DIAGNOSIS — I10 Essential (primary) hypertension: Secondary | ICD-10-CM | POA: Diagnosis not present

## 2022-04-21 DIAGNOSIS — E1122 Type 2 diabetes mellitus with diabetic chronic kidney disease: Secondary | ICD-10-CM

## 2022-04-21 DIAGNOSIS — E11319 Type 2 diabetes mellitus with unspecified diabetic retinopathy without macular edema: Secondary | ICD-10-CM | POA: Diagnosis not present

## 2022-04-21 DIAGNOSIS — I700 Atherosclerosis of aorta: Secondary | ICD-10-CM

## 2022-04-21 DIAGNOSIS — I739 Peripheral vascular disease, unspecified: Secondary | ICD-10-CM | POA: Diagnosis not present

## 2022-04-21 DIAGNOSIS — E1142 Type 2 diabetes mellitus with diabetic polyneuropathy: Secondary | ICD-10-CM | POA: Diagnosis not present

## 2022-04-21 DIAGNOSIS — Z794 Long term (current) use of insulin: Secondary | ICD-10-CM

## 2022-04-21 DIAGNOSIS — N183 Chronic kidney disease, stage 3 unspecified: Secondary | ICD-10-CM

## 2022-04-21 MED FILL — AMLODIPINE 10 MG TABLET: ORAL | 30 days supply | Qty: 30 | Fill #7

## 2022-04-21 MED FILL — SODIUM BICARBONATE 650 MG TABLET: ORAL | 30 days supply | Qty: 60 | Fill #1

## 2022-04-21 MED FILL — PREGABALIN 75 MG CAPSULE: ORAL | 30 days supply | Qty: 90 | Fill #2

## 2022-04-21 NOTE — Patient Instructions (Addendum)
It was a pleasure meeting you today. Thank you for allowing me to take part in your health care.  Our goals for today as we discussed include:  Follow up with Nephrology as scheduled  Follow up with Cardiology as scheduled   Please follow-up with PCP in July for annual physical or sooner if needed  If you have any questions or concerns, please do not hesitate to call the office at [phone].  I look forward to our next visit and until then take care and stay safe.  Regards,   Carollee Leitz, MD   [facility]

## 2022-04-21 NOTE — Progress Notes (Signed)
SUBJECTIVE:   Chief Complaint  Patient presents with   Establish Care    Transfer of Care- Dr. Olivia Mackie   HPI Patient presents to clinic to transfer care  No acute concerns today  Patient reports needs long cancer screening which is scheduled at [facility].  Patient reports she has multiple providers.  ESRD/renal transplant Follows with nephrology at [facility].  Takes Lasix 40 mg daily and as needed for increase in swelling or weight gain.  For her transplant she reports taking check tacrolimus 11 mg daily total.  Not currently on azathioprine or mycophenolate.  Diabetic retinopathy Follows with Dr. Marvel Plan  Diabetic neuropathy Follows at [facility] foot care.   PAD/CAD/CHF/hypertension/valvular disease Reports follows with cardiology.  Takes ASA, 81 mg, amlodipine 10 mg, Imdur 30 mg, clonidine point 1 mg twice daily.  Not currently on statin therapy although prescribed 20 mg Lipitor daily.  Has appointment with Dr. Rockey Situ tomorrow  OSA/lung nodule Follows with pulmonology at [facility].  Quit smoking in 2014.  Takes albuterol inhaler as needed.  Diabetes type 2 Last A1c 5.9.  Currently asymptomatic.  Diet controlled.  Not on statin therapy.  Chronic pain Endorses marijuana use and daily for chronic pain.  PERTINENT PMH / PSH: ESRD on dialysis status post renal transplant Hypertension Diabetes type 2 CAD status post stent Hyperlipidemia PAD COPD OSA Chronic pain  OBJECTIVE:  BP 126/74 (BP Location: Right Arm, Patient Position: Sitting, Cuff Size: Normal)   Pulse (!) 59   Temp 97.7 F (36.5 C) (Oral)   Ht 5' 7.5" (1.715 m)   Wt 144 lb 12.8 oz (65.7 kg)   SpO2 96%   BMI 22.34 kg/m    Physical Exam Vitals reviewed.  Constitutional:      General: She is not in acute distress.    Appearance: She is not ill-appearing.  HENT:     Head: Normocephalic.     Nose: Nose normal.  Eyes:     Conjunctiva/sclera: Conjunctivae normal.  Cardiovascular:     Rate and  Rhythm: Normal rate and regular rhythm.     Heart sounds: Normal heart sounds.  Pulmonary:     Effort: Pulmonary effort is normal.     Breath sounds: Normal breath sounds.  Abdominal:     General: Abdomen is flat. Bowel sounds are normal.     Palpations: Abdomen is soft.  Musculoskeletal:        General: Normal range of motion.     Cervical back: Normal range of motion.  Neurological:     Mental Status: She is alert and oriented to person, place, and time. Mental status is at baseline.  Psychiatric:        Mood and Affect: Mood normal.        Behavior: Behavior normal.        Thought Content: Thought content normal.        Judgment: Judgment normal.     ASSESSMENT/PLAN:  Type 2 diabetes mellitus with stage 3 chronic kidney disease, with long-term current use of insulin, unspecified whether stage 3a or 3b CKD ([place]) Assessment & Plan: Asymptomatic.  Well-controlled without medications.  Last A1c 6.7.  Metformin was discontinued secondary to GI upset Follows with endocrinology    Essential hypertension Assessment & Plan: Follows with cardiology Continue Norvasc 10 mg daily Continue clonidine 1.1 mg twice daily Strict return precautions provided   Valvular heart disease Assessment & Plan: Asymptomatic and appears euvolemic on exam Currently on Lasix 40 mg daily Continue  Lasix 40 mg daily Follow-up with cardiology as scheduled    PAD (peripheral artery disease) ([place]) Assessment & Plan: Follows with cardiology    Aortic atherosclerosis ([place]) Assessment & Plan: Not currently on statin therapy.  Unclear why medication was stopped. -Would recommend initiation of statin therapy.  Can discuss with cardiology in AM.   Diabetic retinopathy of both eyes associated with type 2 diabetes mellitus, macular edema presence unspecified, unspecified retinopathy severity ([place]) Assessment & Plan: Follows with Dr. Marvel Plan.  Up-to-date with eye exam.   Diabetic polyneuropathy  associated with type 2 diabetes mellitus ([place]) Assessment & Plan: Follows with [place] foot care.  Annual footcare up-to-date     End stage renal disease [facility]) Assessment & Plan: Status post renal transplant      PDMP reviewed  Return in about 8 months (around 12/20/2022).  Carollee Leitz, MD

## 2022-04-21 NOTE — Progress Notes (Unsigned)
Cardiology Office Note  Date:  04/22/2022   ID:  Theresa [name], DOB 1956-05-26, MRN [id number]  PCP:  Carollee Leitz, MD   Chief Complaint  Patient presents with   6 month follow up     Patient c/o shortness of breath with little to no exertion and rapid heart beats. Medications reviewed by the patient verbally.     HPI:  Theresa [name] is a pleasant 66 -year old woman with  CAD, Drug-eluting stent placed December 2014 to the mid RCA Repeat cath 2017 for positive stress test at [facility], medical management 25 years of smoking (quit in 2013-2014),  diabetes,  chronic renal insufficiency,   moderate to severe mitral valve regurgitation on prior echocardiogram (improved to moderate MR on subsequent echo), ESRD on dialysis since February 2014, 3 days per week. 59% bilateral carotid arterial disease as of 2017  Positive stress test  04/04/16 at [facility] Renal transplant 2020 PET Myocardial Stress was performed on 06/19/2021 which showed no clear evidence of any significant ischemia She presents today for follow-up of her coronary artery disease   Last seen by myself in clinic May 2023 Weight down 7 pounds Reports having insomnia, has tried sleep aids with no improvement in symptoms  Denies chest pain concerning for angina Smokes marijuana for pain control, does not smoke cigarettes  Reports drinking lots of fluids, stays hydrated " to protect her kidney" Reports she is not on Lipitor for unclear reasons Recent lipid panel above goal, prior cholesterol 130 now running 180s  EKG personally reviewed by myself on todays visit Normal sinus rhythm rate 75 bpm nonspecific ST abnormality, LVH  Other past medical history reviewed hospital emergency room December 2022 for shortness of breath  Hospital records from [facility] reviewed, long hospitalization for COVID Had cardiac work-up including echo and PET stress  admission to [facility] chest pain  06/15/21 to 1/20 macrocytic anemia (H/H 7.9/23.8), hyperkalemia  (5.5), low bicarb (19), elevate creatinine (1.24), hypoalbuminemia (2.8), elevated BNP (854 CXR showed findings suggestive of pulmonary edema  started on lasix IV   PET Myocardial Stress was performed on 06/19/2021 which showed no clear evidence of any significant ischemia or scar, normal left ventricular systolic function (post stress ejection fraction is > 60%), coronary and aortic calcifications, small bilateral pleural effusions, trace pericardial effusion and diffuse pulmonary edema.  TTE in 05/2021 showed normal LV structure and function, grade I diastolic dysfunction, moderate LVH, and normal RV structure and function. Her TTE this hospitalization showed mildly thickened LV wall, normal LV function (LVEF >60-65%), elevated LV end-diastolic pressure, mild-to-moderate MR / MS / AR / aortic stenosis, normal RV structure and function, and mild PAH  Klebsiella / Ecoli UTI  COVID-19 - Likely contracted in hospital as she was negative on admission received a course of remdesivir   iron studies this hospitalization showed findings consistent with ACD with a % saturation of 13.  D/c  06/28/21  Myoview January 2019 at [facility] no ischemia Echocardiogram February 2019 [facility] moderate MR, normal ejection fraction Tolerating hemodialysis Monday Wednesday Friday Echocardiogram July 2020 normal ejection fraction [facility] 2019-04-05 deceased donor renal transplant  hospital November 2021 Hypertensive urgency, flash pulmonary edema, mitral valve regurgitation She had stopped several of her blood pressure medications, these were restarted with improved blood pressure Now on amlodipine carvedilol clonidine Sublingual nitro for any spikes in pressure  Cardic catheterization 2017 showed 70% disease ostial right PDA, unchanged from 2014 cardiac catheterization otherwise other regions of 20-30% disease, nonobstructive  PMH:  has a past medical history of Anemia of chronic disease, Anginal pain ([place]), Carotid  arterial disease ([place]), Chronic constipation, Chronic diastolic CHF (congestive heart failure) ([place]), Colon polyps, COPD (chronic obstructive pulmonary disease) ([place]), Coronary artery disease, COVID-19, Diabetes mellitus, Diabetic neuropathy ([place]), Diabetic retinopathy ([place]) (05/28/2013), Emphysema, ESRD on hemodialysis ([place]), History of bronchitis, History of pneumonia, History of tobacco abuse, Hyperlipidemia, Hypertension, Moderate to severe mitral insufficiency, Myocardial infarct [facility]) (05/2013), Peripheral vascular disease ([place]), Sickle cell trait ([place]), Thyroid nodule, and Valvular heart disease.  PSH:    Past Surgical History:  Procedure Laterality Date   A/V FISTULAGRAM Left 11/10/2017   Procedure: A/V FISTULAGRAM;  Surgeon: Katha Cabal, MD;  Location: [place] CV LAB;  Service: Cardiovascular;  Laterality: Left;   A/V SHUNTOGRAM Left 02/08/2019   Procedure: A/V SHUNTOGRAM;  Surgeon: Katha Cabal, MD;  Location: [place] CV LAB;  Service: Cardiovascular;  Laterality: Left;   ABDOMINAL HYSTERECTOMY     2000 for cysts on ovaries total no abnormal pap   CARDIAC CATHETERIZATION     CARDIAC CATHETERIZATION N/A 05/02/2015   Procedure: Left Heart Cath and Coronary Angiography;  Surgeon: [name], MD;  Location: [place] CV LAB;  Service: Cardiovascular;  Laterality: N/A;   COLONOSCOPY WITH PROPOFOL N/A 07/08/2016   Procedure: COLONOSCOPY WITH PROPOFOL;  Surgeon: [name], MD;  Location: [facility] ENDOSCOPY;  Service: Endoscopy;  Laterality: N/A;   COLONOSCOPY WITH PROPOFOL N/A 08/20/2017   Procedure: COLONOSCOPY WITH PROPOFOL;  Surgeon: [name], MD;  Location: [facility] ENDOSCOPY;  Service: Gastroenterology;  Laterality: N/A;   COLONOSCOPY WITH PROPOFOL N/A 09/06/2021   Procedure: COLONOSCOPY WITH PROPOFOL;  Surgeon: [name], MD;  Location: [facility] ENDOSCOPY;  Service: Gastroenterology;  Laterality: N/A;   colonscopy     CORONARY ANGIOPLASTY  05/28/2014   stent  placement to the mid RCA   CYSTOURETHROSCOPY     [facility] with stent placement 11 or 05/2019 and stent removal 06/13/19    DILATION AND CURETTAGE OF UTERUS     several in the early 80's   ESOPHAGOGASTRODUODENOSCOPY     2012   ESOPHAGOGASTRODUODENOSCOPY N/A 09/06/2021   Procedure: ESOPHAGOGASTRODUODENOSCOPY (EGD);  Surgeon: [name], MD;  Location: [facility] ENDOSCOPY;  Service: Gastroenterology;  Laterality: N/A;   ESOPHAGOGASTRODUODENOSCOPY (EGD) WITH PROPOFOL N/A 03/11/2018   Procedure: ESOPHAGOGASTRODUODENOSCOPY (EGD) WITH PROPOFOL;  Surgeon: [name], MD;  Location: [facility] ENDOSCOPY;  Service: Gastroenterology;  Laterality: N/A;   EYE SURGERY     bilateral laser 2012   EYE SURGERY     right   EYE SURGERY     x4 both eyes   GAS INSERTION  09/30/2011   Procedure: INSERTION OF GAS;  Surgeon: [name], MD;  Location: [place];  Service: Ophthalmology;  Laterality: Right;  [id number]   GAS/FLUID EXCHANGE  09/30/2011   Procedure: GAS/FLUID EXCHANGE;  Surgeon: [name], MD;  Location: [place];  Service: Ophthalmology;  Laterality: Right;   KIDNEY TRANSPLANT     03/30/19 Dr. Mickel Baas Thomas/Dr. Cristie Hem Zendel/Dr. Gilford Raid; [facility] transplant Dr. Horald Chestnut will be primary [phone] coordinator Sarah Wynkoop [phone]   KIDNEY TRANSPLANT     03/30/19 [facility]   LEFT HEART CATHETERIZATION WITH CORONARY ANGIOGRAM N/A 05/28/2013   Procedure: LEFT HEART CATHETERIZATION WITH CORONARY ANGIOGRAM;  Surgeon: [name], MD;  Location: [facility] CATH LAB;  Service: Cardiovascular;  Laterality: N/A;   PARS PLANA VITRECTOMY  04/22/2011   Procedure: PARS PLANA VITRECTOMY WITH 25 GAUGE;  Surgeon: [name], MD;  Location: [place];  Service: Ophthalmology;  Laterality: Left;  membrane peel, endolaser, gas fluid exchange, silicone oil, repair of complex traction retinal detachment   PARS PLANA VITRECTOMY  09/30/2011   Procedure: PARS PLANA VITRECTOMY WITH 25 GAUGE;  Surgeon: [name], MD;  Location:  [place];  Service: Ophthalmology;  Laterality: Right;  Endolaser; Repair of Complex Traction Retinal Detachment   PARS PLANA VITRECTOMY  02/24/2012   Procedure: PARS PLANA VITRECTOMY WITH 25 GAUGE;  Surgeon: [name], MD;  Location: [place];  Service: Ophthalmology;  Laterality: Left;   PTCA     SILICON OIL REMOVAL  34/19/6222   Procedure: SILICON OIL REMOVAL;  Surgeon: [name], MD;  Location: [place];  Service: Ophthalmology;  Laterality: Left;   THROMBECTOMY / ARTERIOVENOUS GRAFT REVISION     TUBAL LIGATION     1979   UPPER EXTREMITY ANGIOGRAPHY Left 02/08/2019   Procedure: UPPER EXTREMITY ANGIOGRAPHY;  Surgeon: Katha Cabal, MD;  Location: [place] CV LAB;  Service: Cardiovascular;  Laterality: Left;    Current Outpatient Medications  Medication Sig Dispense Refill   acetaminophen (TYLENOL) 500 MG tablet Take 1,000 mg by mouth daily as needed.     albuterol (PROVENTIL) (2.5 MG/3ML) 0.083% nebulizer solution Take 3 mLs (2.5 mg total) by nebulization every 6 (six) hours as needed for wheezing or shortness of breath. 75 mL 11   amLODipine (NORVASC) 10 MG tablet Take 1 tablet (10 mg total) by mouth daily. 90 tablet 3   aspirin EC 81 MG tablet Take 81 mg by mouth daily. Swallow whole.     atorvastatin (LIPITOR) 20 MG tablet Take 1 tablet (20 mg total) by mouth daily. 90 tablet 3   cloNIDine (CATAPRES) 0.1 MG tablet Take 1 tablet (0.1 mg total) by mouth 2 (two) times daily. May take an extra 0.1 mg clonidine for blood pressure >160 200 tablet 3   ENVARSUS XR 1 MG TB24 Take 3 mg by mouth daily as needed. Total dose is 9 mg every morning at 0900.     esomeprazole (NEXIUM) 20 MG capsule Take 20 mg by mouth daily.     isosorbide mononitrate (IMDUR) 30 MG 24 hr tablet Take 1 tablet (30 mg total) by mouth daily. 90 tablet 3   loratadine (CLARITIN) 10 MG tablet Take 10 mg by mouth daily.     pregabalin (LYRICA) 75 MG capsule Take 1 capsule (75 mg total) by mouth 2 (two) times  daily. 60 capsule 5   sodium bicarbonate 650 MG tablet Take 650 mg by mouth 2 (two) times daily.     Sodium Zirconium Cyclosilicate (LOKELMA PO) Take by mouth in the morning and at bedtime.     tacrolimus ER (ENVARSUS XR) 1 MG TB24 Take 1 mg by mouth daily.     tacrolimus ER (ENVARSUS XR) 4 MG TB24 Take 8 mg by mouth daily.     Vitamin D, Ergocalciferol, (DRISDOL) 1.25 MG (50000 UNIT) CAPS capsule Take 50,000 Units by mouth every 7 (seven) days. Mondays     acetaminophen (TYLENOL) 500 MG tablet Take by mouth. (Patient not taking: Reported on 04/22/2022)     albuterol (VENTOLIN HFA) 108 (90 Base) MCG/ACT inhaler Inhale 1-2 puffs into the lungs every 6 (six) hours as needed for wheezing or shortness of breath. (Patient not taking: Reported on 04/22/2022) 1 each 12   azaTHIOprine (IMURAN) 50 MG tablet SMARTSIG:2 Tablet(s) By Mouth Daily (Patient not taking: Reported  on 04/22/2022)     lidocaine (LIDODERM) 5 % Place onto the skin. (Patient not taking: Reported on 04/22/2022)     LOKELMA 10 g PACK packet  (Patient not taking: Reported on 04/22/2022)     metFORMIN (GLUCOPHAGE) 500 MG tablet Take by mouth. (Patient not taking: Reported on 04/22/2022)     mycophenolate (MYFORTIC) 180 MG EC tablet Take by mouth. (Patient not taking: Reported on 04/22/2022)     nitroGLYCERIN (NITROSTAT) 0.4 MG SL tablet Place 1 tablet (0.4 mg total) under the tongue every 5 (five) minutes as needed for chest pain. (Patient not taking: Reported on 04/22/2022) 25 tablet 3   sodium chloride (OCEAN) 0.65 % SOLN nasal spray Place 2 sprays into both nostrils as needed for congestion. (Patient not taking: Reported on 04/22/2022) 30 mL 2   tacrolimus ER (ENVARSUS XR) 4 MG TB24 Take 8 mg by mouth daily before breakfast. Total dose is 9 mg daily in the morning. (Patient not taking: Reported on 04/22/2022)     Vitamin D, Ergocalciferol, (DRISDOL) 1.25 MG (50000 UNIT) CAPS capsule Take 1 capsule (1,250 mcg total) by mouth once a week.  Take for 8 weeks as directed. (Patient not taking: Reported on 04/22/2022)     No current facility-administered medications for this visit.    Allergies:   Hydrocodone and Lisinopril   Social History:  The patient  reports that she quit smoking about 3 years ago. Her smoking use included cigarettes. She has a 25.00 pack-year smoking history. She has never used smokeless tobacco. She reports current drug use. Drug: Marijuana. She reports that she does not drink alcohol.   Family History:   family history includes Breast cancer in her sister; Colon cancer in her father and sister; Diabetes in her brother, brother, and daughter; Heart attack in her brother and mother; Heart disease in her mother; Kidney failure in her daughter; Renal Disease in her daughter; Stroke in her brother and mother.   Review of Systems: Review of Systems  Constitutional:  Positive for malaise/fatigue.  HENT: Negative.    Respiratory: Negative.    Cardiovascular: Negative.   Gastrointestinal: Negative.   Musculoskeletal: Negative.   Neurological: Negative.   Psychiatric/Behavioral: Negative.    All other systems reviewed and are negative.  PHYSICAL EXAM: VS:  BP (!) 124/40 (BP Location: Left Arm, Patient Position: Sitting, Cuff Size: Normal)   Pulse 75   Ht 5' 7.5" (1.715 m)   Wt 144 lb (65.3 kg)   BMI 22.22 kg/m  , BMI Body mass index is 22.22 kg/m. Constitutional:  oriented to person, place, and time. No distress.  HENT:  Head: Grossly normal Eyes:  no discharge. No scleral icterus.  Neck: No JVD, no carotid bruits  Cardiovascular: Regular rate and rhythm, 3-8/7 systolic ejection murmur right and left sternal borders Pulmonary/Chest: Clear to auscultation bilaterally, no wheezes or rails Abdominal: Soft.  no distension.  no tenderness.  Musculoskeletal: Normal range of motion Neurological:  normal muscle tone. Coordination normal. No atrophy Skin: Skin warm and dry Psychiatric: normal affect,  pleasant  Recent Labs: 05/19/2021: TSH 1.976 05/21/2021: ALT 10; Magnesium 2.3 06/07/2021: BUN 14; Creatinine, Ser 1.45; Hemoglobin 8.9; Platelets 178; Potassium 5.0; Sodium 129    Lipid Panel Lab Results  Component Value Date   CHOL 161 05/22/2016   HDL 59 05/22/2016   LDLCALC 89 05/22/2016   TRIG 65 05/22/2016    Wt Readings from Last 3 Encounters:  04/22/22 144 lb (65.3 kg)  04/21/22 144 lb  12.8 oz (65.7 kg)  12/18/21 145 lb 6.4 oz (66 kg)     ASSESSMENT AND PLAN:  Essential (primary) hypertension -  Blood pressure is well controlled on today's visit. No changes made to the medications.  For low blood pressure/orthostasis symptoms may need to decrease amlodipine down to 5 daily, this was discussed with her  Coronary artery disease involving native coronary artery of native heart with angina pectoris -  Chronic stable angina symptoms  Continue isosorbide, restart Lipitor Prior stress testing January 2023 with no ischemia, done at [facility], reviewed with her No further testing ordered  Mitral valve insufficiency, unspecified etiology - Previous echocardiogram September 2017 with moderate to severe MR Mild to moderate on echocardiogram at [facility] this past month Prior echoes through our system showing moderate regurg, stable  ESRD, on hemodialysis ([place]) Off HD Had transplant 2020 Followed by [facility] nephrology Remains on Lasix, renal function stable  Symptomatic anemia Hemoglobin 10  Diabetic polyneuropathy associated with type 2 diabetes mellitus (HCC) HBA1C well controlled, 6.7 Off diabetes medications, weight trending downward, 7 pounds since 6 months ago  S/P coronary artery stent placement Denies anginal symptoms  Carotid stenosis Less than 39% bilaterally Has stenosis left subclavian Quit smoking, 2014   Total encounter time more than 30 minutes  Greater than 50% was spent in counseling and coordination of care with the patient   Orders Placed This Encounter   Procedures   EKG 12-Lead      Signed, Esmond Plants, M.D., Ph.D. 04/22/2022  [place], [place]

## 2022-04-22 ENCOUNTER — Ambulatory Visit: Payer: Medicare Other | Attending: Cardiovascular Disease | Admitting: Cardiovascular Disease

## 2022-04-22 ENCOUNTER — Encounter: Payer: Self-pay | Admitting: Cardiovascular Disease

## 2022-04-22 VITALS — BP 124/40 | HR 75 | Ht 67.5 in | Wt 144.0 lb

## 2022-04-22 DIAGNOSIS — E1122 Type 2 diabetes mellitus with diabetic chronic kidney disease: Secondary | ICD-10-CM

## 2022-04-22 DIAGNOSIS — I25118 Atherosclerotic heart disease of native coronary artery with other forms of angina pectoris: Secondary | ICD-10-CM | POA: Diagnosis not present

## 2022-04-22 DIAGNOSIS — I6523 Occlusion and stenosis of bilateral carotid arteries: Secondary | ICD-10-CM | POA: Diagnosis not present

## 2022-04-22 DIAGNOSIS — I10 Essential (primary) hypertension: Secondary | ICD-10-CM

## 2022-04-22 DIAGNOSIS — I5032 Chronic diastolic (congestive) heart failure: Secondary | ICD-10-CM

## 2022-04-22 DIAGNOSIS — I351 Nonrheumatic aortic (valve) insufficiency: Secondary | ICD-10-CM | POA: Diagnosis not present

## 2022-04-22 DIAGNOSIS — I700 Atherosclerosis of aorta: Secondary | ICD-10-CM | POA: Diagnosis not present

## 2022-04-22 DIAGNOSIS — N183 Chronic kidney disease, stage 3 unspecified: Secondary | ICD-10-CM

## 2022-04-22 DIAGNOSIS — Z794 Long term (current) use of insulin: Secondary | ICD-10-CM

## 2022-04-22 DIAGNOSIS — I739 Peripheral vascular disease, unspecified: Secondary | ICD-10-CM

## 2022-04-22 DIAGNOSIS — N186 End stage renal disease: Secondary | ICD-10-CM | POA: Diagnosis not present

## 2022-04-22 DIAGNOSIS — E785 Hyperlipidemia, unspecified: Secondary | ICD-10-CM | POA: Diagnosis not present

## 2022-04-22 DIAGNOSIS — I340 Nonrheumatic mitral (valve) insufficiency: Secondary | ICD-10-CM

## 2022-04-22 DIAGNOSIS — I38 Endocarditis, valve unspecified: Secondary | ICD-10-CM | POA: Diagnosis not present

## 2022-04-22 MED ORDER — ATORVASTATIN CALCIUM 20 MG PO TABS: 20.0000 mg | ORAL_TABLET | Freq: Every day | ORAL | 3 refills | Status: DC

## 2022-04-22 NOTE — Patient Instructions (Addendum)
Medication Instructions:   Please restart atorvastatin 20 mg daily for cholesterol  If you need a refill on your cardiac medications before your next appointment, please call your pharmacy.   Lab work:  No new labs needed  Testing/Procedures:  No new testing needed  Follow-Up: At [facility], you and your health needs are our priority.  As part of our continuing mission to provide you with exceptional heart care, we have created designated Provider Care Teams.  These Care Teams include your primary Cardiologist (physician) and Advanced Practice Providers (APPs -  Physician Assistants and Nurse Practitioners) who all work together to provide you with the care you need, when you need it.  You will need a follow up appointment in 12 months  Providers on your designated Care Team:   Murray Hodgkins, NP Christell Faith, PA-C Cadence Kathlen Mody, [place]  COVID-19 Vaccine Information can be found at: [email or web address].uk For questions related to vaccine distribution or appointments, please email [email or web address] or call [phone].

## 2022-04-23 ENCOUNTER — Telehealth: Payer: Self-pay | Admitting: Family Medicine

## 2022-04-23 DIAGNOSIS — Z122 Encounter for screening for malignant neoplasm of respiratory organs: Secondary | ICD-10-CM

## 2022-04-23 NOTE — Progress Notes (Signed)
  Care Coordination Note  04/23/2022 Name: Theresa [name] MRN: [id number] DOB: 1956-03-04  Theresa [name] is a 66 y.o. year old female who is a primary care patient of Carollee Leitz, MD and is actively engaged with the care management team. I reached out to [name] by phone today to assist with re-scheduling a follow up visit with the RN Case Manager  Follow up plan: Telephone appointment with care management team member scheduled for: 04/25/2022  Julian Hy, [place] Direct Dial: [phone]

## 2022-04-23 NOTE — Telephone Encounter (Signed)
Pt called in stating she would like to get her CT scan done in [place] instead of [place]. Pt would like to be called

## 2022-04-24 ENCOUNTER — Other Ambulatory Visit: Payer: Self-pay | Admitting: Family Medicine

## 2022-04-24 NOTE — Telephone Encounter (Signed)
Patient's care coordinator requests that you put a Lung CT order in for Theresa [name] so the Patient does not have to go to [facility].

## 2022-04-25 ENCOUNTER — Ambulatory Visit: Payer: Self-pay

## 2022-04-25 NOTE — Patient Outreach (Signed)
  Care Coordination   04/25/2022 Name: Theresa [name] MRN: [id number] DOB: 1956-06-06   Care Coordination Outreach Attempts:  An unsuccessful telephone outreach was attempted for a scheduled appointment today. HIPAA compliant message lef tiwth call back phone number and return call request.   Follow Up Plan:  Additional outreach attempts will be made to offer the patient care coordination information and services.   Encounter Outcome:  No Answer  Care Coordination Interventions Activated:  No   Care Coordination Interventions:  No, not indicated    Quinn Plowman RN,BSN,CCM [place] [phone] direct line

## 2022-04-28 ENCOUNTER — Telehealth: Payer: Self-pay | Admitting: *Deleted

## 2022-04-28 NOTE — Telephone Encounter (Signed)
Please order CT lung for lung cancer screening

## 2022-04-28 NOTE — Progress Notes (Signed)
  Care Coordination Note  04/28/2022 Name: TALYN EDDIE MRN: [id number] DOB: July 10, 1955  TENESHIA HEDEEN is a 66 y.o. year old female who is a primary care patient of Carollee Leitz, MD and is actively engaged with the care management team. I reached out to [name] by phone today to assist with re-scheduling a follow up visit with the RN Case Manager  Follow up plan: Unsuccessful telephone outreach attempt made. A HIPAA compliant phone message was left for the patient providing contact information and requesting a return call.   Julian Hy, [place] Direct Dial: [phone]

## 2022-04-28 NOTE — Progress Notes (Signed)
  Care Coordination Note  04/28/2022 Name: MODEST DRAEGER MRN: [id number] DOB: 19-Jul-1955  ATHENA BALTZ is a 66 y.o. year old female who is a primary care patient of Carollee Leitz, MD and is actively engaged with the care management team. I reached out to [name] by phone today to assist with re-scheduling a follow up visit with the RN Case Manager  Follow up plan: Telephone appointment with care management team member scheduled for: 05/09/2022  Julian Hy, [place] Direct Dial: [phone]

## 2022-04-29 NOTE — Addendum Note (Signed)
Addended by: Fulton Mole D on: 04/29/2022 10:50 AM   Modules accepted: Orders

## 2022-04-29 NOTE — Telephone Encounter (Signed)
Done.  Theresa [name]

## 2022-05-03 ENCOUNTER — Encounter: Payer: Self-pay | Admitting: Family Medicine

## 2022-05-03 NOTE — Assessment & Plan Note (Signed)
Status post renal transplant

## 2022-05-03 NOTE — Assessment & Plan Note (Signed)
Follows with [facility].  Annual footcare up-to-date

## 2022-05-03 NOTE — Assessment & Plan Note (Signed)
Follows with cardiology Continue Norvasc 10 mg daily Continue clonidine 1.1 mg twice daily Strict return precautions provided

## 2022-05-03 NOTE — Assessment & Plan Note (Signed)
Asymptomatic.  Well-controlled without medications.  Last A1c 6.7.  Metformin was discontinued secondary to GI upset Follows with endocrinology

## 2022-05-03 NOTE — Assessment & Plan Note (Signed)
Follows with Dr. Marvel Plan.  Up-to-date with eye exam.

## 2022-05-03 NOTE — Assessment & Plan Note (Signed)
Follows with cardiology 

## 2022-05-03 NOTE — Assessment & Plan Note (Signed)
Asymptomatic and appears euvolemic on exam Currently on Lasix 40 mg daily Continue Lasix 40 mg daily Follow-up with cardiology as scheduled

## 2022-05-03 NOTE — Assessment & Plan Note (Signed)
Not currently on statin therapy.  Unclear why medication was stopped. -Would recommend initiation of statin therapy.  Can discuss with cardiology in AM.

## 2022-05-06 DIAGNOSIS — Z940 Kidney transplant status: Secondary | ICD-10-CM | POA: Diagnosis not present

## 2022-05-09 ENCOUNTER — Ambulatory Visit: Payer: Self-pay

## 2022-05-09 NOTE — Patient Outreach (Signed)
  Care Coordination   Follow Up Visit Note   05/09/2022 Name: SERENE KOPF MRN: [id number] DOB: [birth year]  ARTRICE KRAKER is a 66 y.o. year old female who sees Carollee Leitz, MD for primary care. I spoke with  [name] by phone today.  What matters to the patients health and wellness today?  Patient verbalized appreciation of assistance with community resource needs.   Patient denies any further needs or concerns to be addressed.     Goals Addressed             This Visit's Progress    COMPLETED: Patient States she needs community resource assistance for food, dental care, and housing. transportation       Goal met Care Coordination Interventions: Discussed with patient receiving community resources follow up. Provided patient contact phone for community resource guide to call regarding dental resources. Message sent to care guide informing her to expect patients call.           SDOH assessments and interventions completed:  No     Care Coordination Interventions:  Yes, provided   Follow up plan: No further intervention required.   Encounter Outcome:  Pt. Visit Completed   Quinn Plowman RN,BSN,CCM [place] [phone] direct line

## 2022-05-12 ENCOUNTER — Telehealth: Payer: Self-pay

## 2022-05-12 NOTE — Telephone Encounter (Signed)
   Telephone encounter was:  Successful.  05/12/2022 Name: [name] MRN: [id number] DOB: 08-Aug-1955  Theresa [name] is a 66 y.o. year old female who is a primary care patient of Carollee Leitz, MD . The community resource team was consulted for assistance with Transportation Needs  and dental.  Care guide performed the following interventions: Spoke with patient verified home address resent information for dental and transportation resources.  Follow Up Plan:  No further follow up planned at this time. The patient has been provided with needed resources.  [place] Resource Care Guide   [email or web address]  ?? [phone]   Website: [email or web address]  [email or web address]

## 2022-05-19 DIAGNOSIS — Z940 Kidney transplant status: Principal | ICD-10-CM

## 2022-05-19 MED FILL — PREGABALIN 75 MG CAPSULE: ORAL | 30 days supply | Qty: 90 | Fill #3

## 2022-05-19 MED FILL — SODIUM BICARBONATE 650 MG TABLET: ORAL | 30 days supply | Qty: 60 | Fill #2

## 2022-05-19 MED FILL — AMLODIPINE 10 MG TABLET: ORAL | 30 days supply | Qty: 30 | Fill #8

## 2022-05-19 MED FILL — ERGOCALCIFEROL (VITAMIN D2) 1,250 MCG (50,000 UNIT) CAPSULE: ORAL | 28 days supply | Qty: 4 | Fill #1

## 2022-06-16 DIAGNOSIS — Z940 Kidney transplant status: Principal | ICD-10-CM

## 2022-06-18 MED ORDER — PREGABALIN 75 MG CAPSULE
ORAL_CAPSULE | ORAL | 3 refills | 30.00000 days | Status: CN
Start: 2022-06-18 — End: ?
  Filled 2022-06-19: qty 90, 30d supply, fill #0

## 2022-06-19 ENCOUNTER — Ambulatory Visit: Payer: Medicare Other | Admitting: Podiatry

## 2022-06-19 MED FILL — ERGOCALCIFEROL (VITAMIN D2) 1,250 MCG (50,000 UNIT) CAPSULE: ORAL | 28 days supply | Qty: 4 | Fill #2

## 2022-06-19 MED FILL — AMLODIPINE 10 MG TABLET: ORAL | 30 days supply | Qty: 30 | Fill #9

## 2022-06-24 DIAGNOSIS — Z940 Kidney transplant status: Secondary | ICD-10-CM | POA: Diagnosis not present

## 2022-07-10 DIAGNOSIS — E11319 Type 2 diabetes mellitus with unspecified diabetic retinopathy without macular edema: Secondary | ICD-10-CM | POA: Diagnosis not present

## 2022-07-10 LAB — HM DIABETES EYE EXAM

## 2022-07-11 ENCOUNTER — Other Ambulatory Visit: Payer: Self-pay

## 2022-07-14 DIAGNOSIS — Z940 Kidney transplant status: Principal | ICD-10-CM

## 2022-07-14 MED ORDER — ERGOCALCIFEROL (VITAMIN D2) 1,250 MCG (50,000 UNIT) CAPSULE
ORAL_CAPSULE | ORAL | 2 refills | 28 days
Start: 2022-07-14 — End: 2023-07-14

## 2022-07-14 MED ORDER — ATORVASTATIN 20 MG TABLET
ORAL_TABLET | Freq: Every day | ORAL | 3 refills | 90 days | Status: CP
Start: 2022-07-14 — End: 2023-07-14
  Filled 2022-07-21: qty 90, 90d supply, fill #0

## 2022-07-16 MED ORDER — ERGOCALCIFEROL (VITAMIN D2) 1,250 MCG (50,000 UNIT) CAPSULE
ORAL_CAPSULE | ORAL | 2 refills | 28 days
Start: 2022-07-16 — End: 2023-07-16

## 2022-07-21 ENCOUNTER — Telehealth: Payer: Self-pay

## 2022-07-21 MED FILL — SODIUM BICARBONATE 650 MG TABLET: ORAL | 30 days supply | Qty: 60 | Fill #3

## 2022-07-21 MED FILL — AMLODIPINE 10 MG TABLET: ORAL | 30 days supply | Qty: 30 | Fill #10

## 2022-07-21 MED FILL — ESOMEPRAZOLE MAGNESIUM 20 MG CAPSULE,DELAYED RELEASE: ORAL | 90 days supply | Qty: 90 | Fill #1

## 2022-07-21 MED FILL — FUROSEMIDE 40 MG TABLET: ORAL | 30 days supply | Qty: 60 | Fill #4

## 2022-07-21 MED FILL — PREGABALIN 75 MG CAPSULE: ORAL | 30 days supply | Qty: 90 | Fill #1

## 2022-07-21 NOTE — Telephone Encounter (Signed)
Spoke with patient regarding referral to LCS for LDCT.  Patients pack year history is less than minimum 20 pack years.  Patient states she never smoked more than a few cigarettes per day averaging 4 or 5 cigarettes per day.  This would be less than half a pack with 48 years of smoking.  She would not meet the criteria of 20 pack years unless she averaged half a pack per day. Patient denies her smoking history was to a half a pack daily.  Much less she says. One pack lasted 3-4 days.  Explained to patient she does not meet eligibility of 20 pack years for LCS program but alternative could be another type of CT chest, if recommended by her PCP or other provider.  Patient states her doctor in [place] did a CT chest a few years back and recommended she follow up for LDCT.  Since not eligible, will copy PCP on this note for review and further recommendations to patient.  Referral for LCS LDCT has been closed.

## 2022-07-28 DIAGNOSIS — N186 End stage renal disease: Principal | ICD-10-CM

## 2022-07-28 DIAGNOSIS — R7989 Other specified abnormal findings of blood chemistry: Principal | ICD-10-CM

## 2022-07-28 DIAGNOSIS — Z940 Kidney transplant status: Principal | ICD-10-CM

## 2022-08-07 ENCOUNTER — Ambulatory Visit: Admit: 2022-08-07 | Discharge: 2022-08-08 | Payer: MEDICARE

## 2022-08-07 ENCOUNTER — Ambulatory Visit: Admit: 2022-08-07 | Discharge: 2022-08-08 | Payer: MEDICARE | Attending: Nephrology | Primary: Nephrology

## 2022-08-07 ENCOUNTER — Encounter: Admit: 2022-08-07 | Discharge: 2022-08-08 | Payer: MEDICARE | Attending: Nephrology | Primary: Nephrology

## 2022-08-07 ENCOUNTER — Telehealth: Payer: Self-pay | Admitting: Family Medicine

## 2022-08-07 DIAGNOSIS — Z940 Kidney transplant status: Principal | ICD-10-CM

## 2022-08-07 DIAGNOSIS — R7989 Other specified abnormal findings of blood chemistry: Principal | ICD-10-CM

## 2022-08-07 DIAGNOSIS — N186 End stage renal disease: Principal | ICD-10-CM

## 2022-08-07 DIAGNOSIS — Z4822 Encounter for aftercare following kidney transplant: Secondary | ICD-10-CM | POA: Diagnosis not present

## 2022-08-07 DIAGNOSIS — D849 Immunodeficiency, unspecified: Secondary | ICD-10-CM | POA: Diagnosis not present

## 2022-08-07 DIAGNOSIS — I6523 Occlusion and stenosis of bilateral carotid arteries: Secondary | ICD-10-CM | POA: Diagnosis not present

## 2022-08-07 DIAGNOSIS — G4733 Obstructive sleep apnea (adult) (pediatric): Secondary | ICD-10-CM | POA: Diagnosis not present

## 2022-08-07 DIAGNOSIS — I38 Endocarditis, valve unspecified: Secondary | ICD-10-CM | POA: Diagnosis not present

## 2022-08-07 DIAGNOSIS — E782 Mixed hyperlipidemia: Secondary | ICD-10-CM | POA: Diagnosis not present

## 2022-08-07 DIAGNOSIS — I10 Essential (primary) hypertension: Secondary | ICD-10-CM | POA: Diagnosis not present

## 2022-08-07 LAB — HEMOGLOBIN A1C: Hemoglobin A1C: 6.1

## 2022-08-07 NOTE — Telephone Encounter (Signed)
Pt called staying that she is supposed to get a CT scan over '@[facility]'$ , however, she has no clue when is it?? Or don't know where in the hospital does she has to go. She will like a phone call regarding this '@336'$ [id number].

## 2022-08-08 MED ORDER — AZATHIOPRINE 50 MG TABLET
ORAL_TABLET | Freq: Every day | ORAL | 3 refills | 90 days | Status: CP
Start: 2022-08-08 — End: 2023-08-08

## 2022-08-11 ENCOUNTER — Telehealth: Payer: Self-pay

## 2022-08-11 DIAGNOSIS — Z940 Kidney transplant status: Principal | ICD-10-CM

## 2022-08-11 NOTE — Telephone Encounter (Signed)
I called and spoke with the patient and informed her that according to the PCP a ct is not recommended at this time, she asked me to send this information to Dr. Camillia Herter and did in the form of a letter that I faxed to his office.  Reinhart Saulters,cma

## 2022-08-11 NOTE — Telephone Encounter (Signed)
Patient was called and she stated that she had already discussed the Ct scan with the provider before, she needs a CT of her chest because they found a nodule on her lungs and they want to make sure it is not cancerous, patient needs a Ct scan ASAP at the hospital. There is a note in the chart form a RN that suggests this and stated she would reach out to the patients PCP for her to have the CT.  I informed the patient that I would call her back with a solution.    Grover Woodfield,cma

## 2022-08-12 ENCOUNTER — Other Ambulatory Visit: Payer: Self-pay | Admitting: Family Medicine

## 2022-08-19 ENCOUNTER — Encounter: Payer: Self-pay | Admitting: Nephrology

## 2022-08-19 ENCOUNTER — Telehealth: Payer: Self-pay | Admitting: Family Medicine

## 2022-08-19 ENCOUNTER — Other Ambulatory Visit: Payer: Self-pay | Admitting: Family Medicine

## 2022-08-19 DIAGNOSIS — J449 Chronic obstructive pulmonary disease, unspecified: Secondary | ICD-10-CM

## 2022-08-19 NOTE — Telephone Encounter (Signed)
Patient Nephrologist is recommending yearly CT fo rFU on lung nodules to be done locally for patient see note from 08/07/22 and note from 03/21/22 I have pasted below from Care everywhere.  Can we refer to lung nodule or order CT patient very concerned PCP will not order. Patient has HX of 1+ pack per day smoking cigarettes .   "Pulmonary Nodules with History of Smoking Associated COPD:  - Chest CT 09/12/20 revealed stable pulmonary nodules compared to 05/02/19 - Will continue annual lung cancer screening low dose CT, now due ." Form Nephrology note 04/01/23.

## 2022-08-19 NOTE — Telephone Encounter (Addendum)
Patient called and stated that she has been trying to get a referral for a CT Scan since April 21, 2022. Patient has a doctor in [place] that wants to see the CT scan results.Patient is requesting a referral for CT scan. Patient would like the CT scan done in [place]. See 08/11/2022 telephone note.

## 2022-08-20 NOTE — Telephone Encounter (Signed)
I called the patient to inform her that a referral was sent to pulmonary and she stated she is scheduled, I gave her the provider name, address and time of her appointment and she understood.  Jac Romulus,cma

## 2022-08-21 MED ORDER — FUROSEMIDE 40 MG TABLET
ORAL_TABLET | Freq: Two times a day (BID) | ORAL | 11 refills | 30 days | Status: CP
Start: 2022-08-21 — End: ?
  Filled 2022-08-22: qty 60, 30d supply, fill #0

## 2022-08-22 MED FILL — PREGABALIN 75 MG CAPSULE: ORAL | 30 days supply | Qty: 90 | Fill #2

## 2022-08-22 MED FILL — SODIUM BICARBONATE 650 MG TABLET: ORAL | 30 days supply | Qty: 60 | Fill #4

## 2022-08-22 MED FILL — AMLODIPINE 10 MG TABLET: ORAL | 30 days supply | Qty: 30 | Fill #11

## 2022-08-28 ENCOUNTER — Encounter: Payer: Self-pay | Admitting: Podiatry

## 2022-09-08 DIAGNOSIS — N186 End stage renal disease: Principal | ICD-10-CM

## 2022-09-08 DIAGNOSIS — Z940 Kidney transplant status: Principal | ICD-10-CM

## 2022-09-09 DIAGNOSIS — N186 End stage renal disease: Secondary | ICD-10-CM | POA: Diagnosis not present

## 2022-09-09 DIAGNOSIS — Z940 Kidney transplant status: Secondary | ICD-10-CM | POA: Diagnosis not present

## 2022-09-10 ENCOUNTER — Other Ambulatory Visit: Payer: Self-pay | Admitting: Family Medicine

## 2022-09-10 DIAGNOSIS — N390 Urinary tract infection, site not specified: Principal | ICD-10-CM

## 2022-09-10 DIAGNOSIS — R928 Other abnormal and inconclusive findings on diagnostic imaging of breast: Secondary | ICD-10-CM

## 2022-09-10 DIAGNOSIS — R921 Mammographic calcification found on diagnostic imaging of breast: Secondary | ICD-10-CM

## 2022-09-10 MED ORDER — CEFDINIR 300 MG CAPSULE
ORAL_CAPSULE | Freq: Two times a day (BID) | ORAL | 0 refills | 5 days | Status: CP
Start: 2022-09-10 — End: 2022-09-15

## 2022-09-15 ENCOUNTER — Ambulatory Visit: Payer: Medicare HMO | Admitting: Podiatry

## 2022-09-16 ENCOUNTER — Institutional Professional Consult (permissible substitution): Payer: Medicare Other | Admitting: Student in an Organized Health Care Education/Training Program

## 2022-09-23 MED ORDER — AMLODIPINE 10 MG TABLET
ORAL_TABLET | Freq: Every day | ORAL | 11 refills | 30 days | Status: CP
Start: 2022-09-23 — End: ?
  Filled 2022-09-25: qty 30, 30d supply, fill #0

## 2022-09-25 MED FILL — PREGABALIN 75 MG CAPSULE: ORAL | 30 days supply | Qty: 90 | Fill #3

## 2022-09-25 MED FILL — SODIUM BICARBONATE 650 MG TABLET: ORAL | 30 days supply | Qty: 60 | Fill #5

## 2022-09-25 MED FILL — FUROSEMIDE 40 MG TABLET: ORAL | 30 days supply | Qty: 60 | Fill #1

## 2022-09-30 ENCOUNTER — Telehealth: Payer: Self-pay | Admitting: Family Medicine

## 2022-09-30 ENCOUNTER — Ambulatory Visit
Admission: RE | Admit: 2022-09-30 | Discharge: 2022-09-30 | Disposition: A | Discharge status: Home / Self Care | Payer: Medicare HMO | Source: Ambulatory Visit | Attending: Family Medicine | Admitting: Family Medicine

## 2022-09-30 DIAGNOSIS — R928 Other abnormal and inconclusive findings on diagnostic imaging of breast: Secondary | ICD-10-CM

## 2022-09-30 DIAGNOSIS — R921 Mammographic calcification found on diagnostic imaging of breast: Secondary | ICD-10-CM | POA: Diagnosis not present

## 2022-09-30 DIAGNOSIS — Z940 Kidney transplant status: Secondary | ICD-10-CM | POA: Diagnosis not present

## 2022-09-30 NOTE — Telephone Encounter (Signed)
Patient dropped off document FMLA, to be filled out by provider. Patient requested to send it via Fax within 5-days. Document is located in providers tray at front office.Please advise at Mobile [phone] (mobile)   Pt would like a copy mail to her.

## 2022-09-30 NOTE — Telephone Encounter (Signed)
Placed in providers folder to be completed 

## 2022-10-02 ENCOUNTER — Telehealth: Payer: Self-pay | Admitting: Family Medicine

## 2022-10-02 DIAGNOSIS — Z940 Kidney transplant status: Principal | ICD-10-CM

## 2022-10-02 DIAGNOSIS — Z0279 Encounter for issue of other medical certificate: Secondary | ICD-10-CM

## 2022-10-02 MED ORDER — TACROLIMUS XR 1 MG TABLET,EXTENDED RELEASE 24 HR
ORAL_TABLET | 3 refills | 0 days | Status: CP
Start: 2022-10-02 — End: ?

## 2022-10-02 MED ORDER — TACROLIMUS XR 4 MG TABLET,EXTENDED RELEASE 24 HR
ORAL_TABLET | 3 refills | 0 days | Status: CP
Start: 2022-10-02 — End: ?

## 2022-10-02 NOTE — Telephone Encounter (Signed)
err

## 2022-10-03 ENCOUNTER — Encounter: Payer: Self-pay | Admitting: Student in an Organized Health Care Education/Training Program

## 2022-10-03 ENCOUNTER — Ambulatory Visit (INDEPENDENT_AMBULATORY_CARE_PROVIDER_SITE_OTHER): Payer: Medicare HMO | Admitting: Student in an Organized Health Care Education/Training Program

## 2022-10-03 VITALS — BP 134/72 | HR 70 | Temp 98.0°F | Ht 67.5 in | Wt 153.0 lb

## 2022-10-03 DIAGNOSIS — R911 Solitary pulmonary nodule: Secondary | ICD-10-CM

## 2022-10-03 NOTE — Progress Notes (Signed)
Assessment & Plan:   1. Pulmonary nodule  Patient presenting to clinic to establish care. She has a history of renal transplant as well as CAD and HFpEF. She hs no respiratory symptoms at the moment and was under the impression that she is to undergo lung cancer screening. I have reviewed reports of her previous imaging studies mentioning three nodules that would benefit from repeat CT to ensure stability. Should they be unchanged, we can safely cease radiographic surveillance of said nodules. Patient has 14 pack years of smoking history and quick it 2014. She does not qualify for the lung cancer screening program.  - CT CHEST WO CONTRAST; Future   Return in about 3 months (around 01/02/2023).  I spent 30 minutes caring for this patient today, including preparing to see the patient, obtaining a medical history , reviewing a separately obtained history, performing a medically appropriate examination and/or evaluation, counseling and educating the patient/family/caregiver, ordering medications, tests, or procedures, and documenting clinical information in the electronic health record  Raechel Chute, MD [facility] 10/03/2022 10:16 AM    End of visit medications:  No orders of the defined types were placed in this encounter.    Current Outpatient Medications:    acetaminophen (TYLENOL) 500 MG tablet, Take 1,000 mg by mouth daily as needed., Disp: , Rfl:    albuterol (PROVENTIL) (2.5 MG/3ML) 0.083% nebulizer solution, Take 3 mLs (2.5 mg total) by nebulization every 6 (six) hours as needed for wheezing or shortness of breath., Disp: 75 mL, Rfl: 11   atorvastatin (LIPITOR) 20 MG tablet, Take 1 tablet (20 mg total) by mouth daily., Disp: 90 tablet, Rfl: 3   cloNIDine (CATAPRES) 0.1 MG tablet, Take 1 tablet (0.1 mg total) by mouth 2 (two) times daily. May take an extra 0.1 mg clonidine for blood pressure >160, Disp: 200 tablet, Rfl: 3   ENVARSUS XR 1 MG TB24, Take 3 mg by  mouth daily as needed. Total dose is 9 mg every morning at 0900., Disp: , Rfl:    esomeprazole (NEXIUM) 20 MG capsule, Take 20 mg by mouth daily., Disp: , Rfl:    isosorbide mononitrate (IMDUR) 30 MG 24 hr tablet, Take 1 tablet (30 mg total) by mouth daily., Disp: 90 tablet, Rfl: 3   nitroGLYCERIN (NITROSTAT) 0.4 MG SL tablet, Place 1 tablet (0.4 mg total) under the tongue every 5 (five) minutes as needed for chest pain., Disp: 25 tablet, Rfl: 3   pregabalin (LYRICA) 75 MG capsule, Take 75 mg by mouth 2 (two) times daily., Disp: , Rfl:    sodium bicarbonate 650 MG tablet, Take 650 mg by mouth 2 (two) times daily., Disp: , Rfl:    Sodium Zirconium Cyclosilicate (LOKELMA PO), Take by mouth in the morning and at bedtime., Disp: , Rfl:    tacrolimus ER (ENVARSUS XR) 1 MG TB24, Take 1 mg by mouth daily., Disp: , Rfl:    tacrolimus ER (ENVARSUS XR) 4 MG TB24, Take 8 mg by mouth daily before breakfast. Total dose is 9 mg daily in the morning., Disp: , Rfl:    tacrolimus ER (ENVARSUS XR) 4 MG TB24, Take 8 mg by mouth daily., Disp: , Rfl:    Subjective:   PATIENT ID: Theresa [name] GENDER: female DOB: 30-Aug-1955, MRN: [id number]  Chief Complaint  Patient presents with   pulmonary consult    Hx of COPD- occ dry cough.     HPI  Patient is a pleasant 67 year old  female with a history of ESRD, s/p kidney transplant (followed at [facility]) who presents to clinic to establish care.  Patient reports no respiratory symptoms. She has no shortness of breath, no chest pain, no chest tightness, no wheezing, and no cough. She is confused as to why she's here. She thought she was presenting to have a CT scan of her chest, as recommended by her nephrologist. She was previously referred to our lung cancer screening program, but given her limited smoking history (around 14 pack years, quit 10 years ago in 2014) she did not qualify.  Patient's past medical history is notable for ESRD and is s/p kidney transplant. She is  maintained on immune suppression. Her past medical history is also notable for HTN, CAD (NSTEMI 2014, PCI to RCA), HFpEF (moderate MR), sickle cell trait, s/p TAHBSO 2000, . She is maintained on Tacrolimus, Mycophenolate, and Azathioprine. Patient has pulmonary nodules noted on previous chest imaging. Her last chest CT was from 2022 showing three pulmonary nodules (8 mm in the RML, 4 mm in the LUL, 4 mm in the LLL). Per report in care everywhere these were unchanged compared to prior.  Ancillary information including prior medications, full medical/surgical/family/social histories, and PFTs (when available) are listed below and have been reviewed.   Review of Systems  Constitutional:  Negative for chills, fever and weight loss.  Respiratory:  Negative for cough, hemoptysis, sputum production, shortness of breath and wheezing.   Cardiovascular:  Negative for chest pain.  Skin:  Negative for rash.     Objective:   Vitals:   10/03/22 0957  BP: 134/72  Pulse: 70  Temp: 98 F (36.7 C)  TempSrc: Temporal  SpO2: 100%  Weight: 153 lb (69.4 kg)  Height: 5' 7.5" (1.715 m)   100% on RA  BMI Readings from Last 3 Encounters:  10/03/22 23.61 kg/m  04/22/22 22.22 kg/m  04/21/22 22.34 kg/m   Wt Readings from Last 3 Encounters:  10/03/22 153 lb (69.4 kg)  04/22/22 144 lb (65.3 kg)  04/21/22 144 lb 12.8 oz (65.7 kg)    Physical Exam Constitutional:      Appearance: Normal appearance.  Cardiovascular:     Rate and Rhythm: Normal rate and regular rhythm.     Pulses: Normal pulses.     Heart sounds: Normal heart sounds.  Pulmonary:     Effort: Pulmonary effort is normal.     Breath sounds: Normal breath sounds.  Neurological:     General: No focal deficit present.     Mental Status: She is alert and oriented to person, place, and time. Mental status is at baseline.       Ancillary Information    Past Medical History:  Diagnosis Date   Anemia of chronic disease    Anginal  pain (HCC)    Breast calcification seen on mammogram 04/08/2021   Carotid arterial disease (HCC)    a. 02/2013 U/S: 40-59% bilat ICA stenosis.   Chronic constipation    Chronic diastolic CHF (congestive heart failure) (HCC)    a. 10/2013 Echo [facility]): EF 55-60%, mod conc LVH, mod MR, mildly dil LA, mild Ao sclerosis w/o stenosis; b. 02/2016 Echo: EF 55-60%, no rwma, Gr DD, mild AS, mod to sev MR, mildly dil LA, PASP ; c. 07/2017 Echo [facility]): EF >55%, mild to mod LVH, Gr2 DD, Ao scl, Mod MR, mod dil LA, nl RV fxn.   Colon polyps    COPD (chronic obstructive pulmonary disease) (HCC)  Coronary artery disease    a. 05/2013 NSTEMI/PCI: LM 20d, LAD min irregs, LCX small, nl, OM1 nl, RCA dom 61m (2.5x16 Promus DES), PDA1 80p; b. 04/2015 Cath: LM nl, LAD 88m, D1/2 min irregs, LCX 35p/m, OM2/3 min irregs, RCA patent mid stent, RPDA 70ost, RPLB1 30, RPLB2/3 min irregs, EF 55-65%--> Med Rx; c. 06/2017 [facility]): No ischemia/infarct, EF 64%.   Coronary artery disease of native artery of native heart with stable angina pectoris (HCC) 06/27/2013   S/p drug eluting stent RCA 05/2013   COVID-19    06/2021   Diabetes mellitus    Diabetic neuropathy (HCC)    Diabetic retinopathy (HCC) 05/28/2013   Hx bilat retinal detachment, proliferative diab retinopathy and bilat vitreous hemorrhage    Emphysema    ESRD on hemodialysis (HCC)    a. [facility] in [place], [place] [phone]/Dr. [name], on a MWF schedule.  She started dialysis in Feb 2014.  Etiology of renal failure not known, likely diabetes.  Has a left upper arm AV graft.   History of bronchitis    Mar 2012   History of heart attack 01/05/2018   History of pneumonia    June 2012   History of tobacco abuse    a. Quit 2012.   Hypercalcemia 02/27/2020   Hyperkalemia 04/18/2020   Hyperlipidemia    Hypertension    Hyponatremia 05/22/2021   Moderate to severe mitral insufficiency    a. 10/2013 Echo: EF 55-60%, mod MR; b. 02/2016 Echo: EF 55-60%, mod to sev  MR directed posteriorly--felt to be dynamic -worse with volume overload; c. 07/2017 Echo [facility]): EF >55%. Mod MR.   Myocardial infarct [facility]) 05/2013   Neuritis or radiculitis due to rupture of lumbar intervertebral disc 11/28/2014   Peripheral vascular disease (HCC)    Sickle cell trait (HCC)    Thyroid nodule    [place] 06/2017 due to f/u US in 1 year    Valvular heart disease    echo 06/17/21 mild to moderate     Family History  Problem Relation Age of Onset   Stroke Mother    Heart attack Mother    Heart disease Mother    Colon cancer Father    Colon cancer Sister    Breast cancer Sister    Heart attack Brother    Stroke Brother    Diabetes Brother    Diabetes Brother    Diabetes Daughter    Renal Disease Daughter        on HD   Kidney failure Daughter        age 27 as of 12/03/20   Anesthesia problems Neg Hx    Hypotension Neg Hx    Malignant hyperthermia Neg Hx    Pseudochol deficiency Neg Hx      Past Surgical History:  Procedure Laterality Date   A/V FISTULAGRAM Left 11/10/2017   Procedure: A/V FISTULAGRAM;  Surgeon: Renford Dills, MD;  Location: [facility] INVASIVE CV LAB;  Service: Cardiovascular;  Laterality: Left;   A/V SHUNTOGRAM Left 02/08/2019   Procedure: A/V SHUNTOGRAM;  Surgeon: Renford Dills, MD;  Location: [facility] INVASIVE CV LAB;  Service: Cardiovascular;  Laterality: Left;   ABDOMINAL HYSTERECTOMY     2000 for cysts on ovaries total no abnormal pap   CARDIAC CATHETERIZATION     CARDIAC CATHETERIZATION N/A 05/02/2015   Procedure: Left Heart Cath and Coronary Angiography;  Surgeon: Iran Ouch, MD;  Location: [facility] INVASIVE CV LAB;  Service: Cardiovascular;  Laterality: N/A;  COLONOSCOPY WITH PROPOFOL N/A 07/08/2016   Procedure: COLONOSCOPY WITH PROPOFOL;  Surgeon: Wyline Mood, MD;  Location: [facility] ENDOSCOPY;  Service: Endoscopy;  Laterality: N/A;   COLONOSCOPY WITH PROPOFOL N/A 08/20/2017   Procedure: COLONOSCOPY WITH PROPOFOL;  Surgeon: Wyline Mood, MD;   Location: [facility] ENDOSCOPY;  Service: Gastroenterology;  Laterality: N/A;   COLONOSCOPY WITH PROPOFOL N/A 09/06/2021   Procedure: COLONOSCOPY WITH PROPOFOL;  Surgeon: Wyline Mood, MD;  Location: [facility] ENDOSCOPY;  Service: Gastroenterology;  Laterality: N/A;   colonscopy     CORONARY ANGIOPLASTY  05/28/2014   stent placement to the mid RCA   CYSTOURETHROSCOPY     [facility] urology with stent placement 11 or 05/2019 and stent removal 06/13/19    DILATION AND CURETTAGE OF UTERUS     several in the early 80's   ESOPHAGOGASTRODUODENOSCOPY     2012   ESOPHAGOGASTRODUODENOSCOPY N/A 09/06/2021   Procedure: ESOPHAGOGASTRODUODENOSCOPY (EGD);  Surgeon: Wyline Mood, MD;  Location: [facility] ENDOSCOPY;  Service: Gastroenterology;  Laterality: N/A;   ESOPHAGOGASTRODUODENOSCOPY (EGD) WITH PROPOFOL N/A 03/11/2018   Procedure: ESOPHAGOGASTRODUODENOSCOPY (EGD) WITH PROPOFOL;  Surgeon: Wyline Mood, MD;  Location: [facility] ENDOSCOPY;  Service: Gastroenterology;  Laterality: N/A;   EYE SURGERY     bilateral laser 2012   EYE SURGERY     right   EYE SURGERY     x4 both eyes   GAS INSERTION  09/30/2011   Procedure: INSERTION OF GAS;  Surgeon: Sherrie George, MD;  Location: [facility] OR;  Service: Ophthalmology;  Laterality: Right;  [id number]   GAS/FLUID EXCHANGE  09/30/2011   Procedure: GAS/FLUID EXCHANGE;  Surgeon: Sherrie George, MD;  Location: [facility] OR;  Service: Ophthalmology;  Laterality: Right;   KIDNEY TRANSPLANT     03/30/19 Dr. Vernona Rieger Thomas/Dr. Trinna Post Zendel/Dr. Sundra Aland; [facility] transplant Dr. Clelia Croft will be primary [phone] coordinator Sarah Wynkoop [phone]   KIDNEY TRANSPLANT     03/30/19 [facility]   LEFT HEART CATHETERIZATION WITH CORONARY ANGIOGRAM N/A 05/28/2013   Procedure: LEFT HEART CATHETERIZATION WITH CORONARY ANGIOGRAM;  Surgeon: Corky Crafts, MD;  Location: [facility] CATH LAB;  Service: Cardiovascular;  Laterality: N/A;   PARS PLANA VITRECTOMY  04/22/2011   Procedure: PARS PLANA VITRECTOMY WITH 25 GAUGE;  Surgeon:  Sherrie George, MD;  Location: [facility] OR;  Service: Ophthalmology;  Laterality: Left;  membrane peel, endolaser, gas fluid exchange, silicone oil, repair of complex traction retinal detachment   PARS PLANA VITRECTOMY  09/30/2011   Procedure: PARS PLANA VITRECTOMY WITH 25 GAUGE;  Surgeon: Sherrie George, MD;  Location: [facility] OR;  Service: Ophthalmology;  Laterality: Right;  Endolaser; Repair of Complex Traction Retinal Detachment   PARS PLANA VITRECTOMY  02/24/2012   Procedure: PARS PLANA VITRECTOMY WITH 25 GAUGE;  Surgeon: Sherrie George, MD;  Location: [facility] OR;  Service: Ophthalmology;  Laterality: Left;   PTCA     SILICON OIL REMOVAL  02/24/2012   Procedure: SILICON OIL REMOVAL;  Surgeon: Sherrie George, MD;  Location: [facility] OR;  Service: Ophthalmology;  Laterality: Left;   THROMBECTOMY / ARTERIOVENOUS GRAFT REVISION     TUBAL LIGATION     1979   UPPER EXTREMITY ANGIOGRAPHY Left 02/08/2019   Procedure: UPPER EXTREMITY ANGIOGRAPHY;  Surgeon: Renford Dills, MD;  Location: [facility] INVASIVE CV LAB;  Service: Cardiovascular;  Laterality: Left;    Social History   Socioeconomic History   Marital status: Single    Spouse name: Not on file   Number of children: 2  Years of education: Not on file   Highest education level: Not on file  Occupational History   Not on file  Tobacco Use   Smoking status: Former    Packs/day: 1.00    Years: 25.00    Additional pack years: 0.00    Total pack years: 25.00    Types: Cigarettes    Quit date: 03/30/2019    Years since quitting: 3.5   Smokeless tobacco: Never  Vaping Use   Vaping Use: Never used  Substance and Sexual Activity   Alcohol use: No   Drug use: Yes    Types: Marijuana    Comment: daily   Sexual activity: Never  Other Topics Concern   Not on file  Social History Narrative   On disability.    Lives with son Vaughan Basta   2 children (has son and daughter)      Son drives her since her vision has decreased   Social Determinants of  Health   Financial Resource Strain: Low Risk  (12/13/2021)   Overall Financial Resource Strain (CARDIA)    Difficulty of Paying Living Expenses: Not hard at all  Food Insecurity: Food Insecurity Present (01/08/2022)   Hunger Vital Sign    Worried About Running Out of Food in the Last Year: Never true    Ran Out of Food in the Last Year: Sometimes true  Transportation Needs: Unmet Transportation Needs (02/20/2022)   PRAPARE - Administrator, Civil Service (Medical): No    Lack of Transportation (Non-Medical): Yes  Physical Activity: Insufficiently Active (12/13/2021)   Exercise Vital Sign    Days of Exercise per Week: 7 days    Minutes of Exercise per Session: 10 min  Stress: No Stress Concern Present (12/13/2021)   [employer] of Occupational Health - Occupational Stress Questionnaire    Feeling of Stress : Not at all  Social Connections: Unknown (12/13/2021)   Social Connection and Isolation Panel [NHANES]    Frequency of Communication with Friends and Family: More than three times a week    Frequency of Social Gatherings with Friends and Family: More than three times a week    Attends Religious Services: Not on Insurance claims handler of Clubs or Organizations: Not on file    Attends Banker Meetings: Not on file    Marital Status: Not on file  Intimate Partner Violence: Not At Risk (12/13/2021)   Humiliation, Afraid, Rape, and Kick questionnaire    Fear of Current or Ex-Partner: No    Emotionally Abused: No    Physically Abused: No    Sexually Abused: No     Allergies  Allergen Reactions   Hydrocodone Hives   Lisinopril     Unknown reaction      CBC    Component Value Date/Time   WBC 6.5 06/07/2021 1614   RBC 2.80 (L) 06/07/2021 1614   HGB 8.9 (L) 06/07/2021 1614   HGB 12.2 05/12/2014 0741   HCT 26.3 (L) 06/07/2021 1614   HCT 38.9 05/12/2014 0741   PLT 178 06/07/2021 1614   PLT 133 (L) 05/12/2014 0741   MCV 93.9 06/07/2021 1614   MCV 102 (H)  05/12/2014 0741   MCH 31.8 06/07/2021 1614   MCHC 33.8 06/07/2021 1614   RDW 15.9 (H) 06/07/2021 1614   RDW 16.0 (H) 05/12/2014 0741   LYMPHSABS 1.0 05/21/2021 1506   LYMPHSABS 2.4 02/15/2014 0837   MONOABS 0.3 05/21/2021 1506   MONOABS 0.5 02/15/2014  1610   EOSABS 0.3 05/21/2021 1506   EOSABS 0.2 02/15/2014 0837   BASOSABS 0.0 05/21/2021 1506   BASOSABS 0.0 02/15/2014 9604    Pulmonary Functions Testing Results:     No data to display          Outpatient Medications Prior to Visit  Medication Sig Dispense Refill   acetaminophen (TYLENOL) 500 MG tablet Take 1,000 mg by mouth daily as needed.     albuterol (PROVENTIL) (2.5 MG/3ML) 0.083% nebulizer solution Take 3 mLs (2.5 mg total) by nebulization every 6 (six) hours as needed for wheezing or shortness of breath. 75 mL 11   atorvastatin (LIPITOR) 20 MG tablet Take 1 tablet (20 mg total) by mouth daily. 90 tablet 3   cloNIDine (CATAPRES) 0.1 MG tablet Take 1 tablet (0.1 mg total) by mouth 2 (two) times daily. May take an extra 0.1 mg clonidine for blood pressure >160 200 tablet 3   ENVARSUS XR 1 MG TB24 Take 3 mg by mouth daily as needed. Total dose is 9 mg every morning at 0900.     esomeprazole (NEXIUM) 20 MG capsule Take 20 mg by mouth daily.     isosorbide mononitrate (IMDUR) 30 MG 24 hr tablet Take 1 tablet (30 mg total) by mouth daily. 90 tablet 3   nitroGLYCERIN (NITROSTAT) 0.4 MG SL tablet Place 1 tablet (0.4 mg total) under the tongue every 5 (five) minutes as needed for chest pain. 25 tablet 3   pregabalin (LYRICA) 75 MG capsule Take 75 mg by mouth 2 (two) times daily.     sodium bicarbonate 650 MG tablet Take 650 mg by mouth 2 (two) times daily.     Sodium Zirconium Cyclosilicate (LOKELMA PO) Take by mouth in the morning and at bedtime.     tacrolimus ER (ENVARSUS XR) 1 MG TB24 Take 1 mg by mouth daily.     tacrolimus ER (ENVARSUS XR) 4 MG TB24 Take 8 mg by mouth daily before breakfast. Total dose is 9 mg daily in the  morning.     tacrolimus ER (ENVARSUS XR) 4 MG TB24 Take 8 mg by mouth daily.     amLODipine (NORVASC) 10 MG tablet Take 1 tablet (10 mg total) by mouth daily. (Patient not taking: Reported on 10/03/2022) 90 tablet 3   aspirin EC 81 MG tablet Take 81 mg by mouth daily. Swallow whole. (Patient not taking: Reported on 10/03/2022)     loratadine (CLARITIN) 10 MG tablet Take 10 mg by mouth daily. (Patient not taking: Reported on 10/03/2022)     No facility-administered medications prior to visit.

## 2022-10-06 DIAGNOSIS — N186 End stage renal disease: Principal | ICD-10-CM

## 2022-10-06 DIAGNOSIS — Z940 Kidney transplant status: Principal | ICD-10-CM

## 2022-10-06 NOTE — Telephone Encounter (Signed)
Copy sent to scan as well.

## 2022-10-06 NOTE — Telephone Encounter (Signed)
Mailed copy to patient. No number listed to fax. LM to let patient know.

## 2022-10-08 DIAGNOSIS — Z940 Kidney transplant status: Secondary | ICD-10-CM | POA: Diagnosis not present

## 2022-10-08 DIAGNOSIS — N186 End stage renal disease: Secondary | ICD-10-CM | POA: Diagnosis not present

## 2022-10-14 ENCOUNTER — Ambulatory Visit
Admission: RE | Admit: 2022-10-14 | Discharge: 2022-10-14 | Disposition: A | Discharge status: Home / Self Care | Payer: Medicare HMO | Source: Ambulatory Visit | Attending: Family Medicine | Admitting: Family Medicine

## 2022-10-14 DIAGNOSIS — I700 Atherosclerosis of aorta: Secondary | ICD-10-CM | POA: Diagnosis not present

## 2022-10-14 DIAGNOSIS — Z940 Kidney transplant status: Secondary | ICD-10-CM | POA: Diagnosis not present

## 2022-10-14 DIAGNOSIS — R911 Solitary pulmonary nodule: Secondary | ICD-10-CM | POA: Diagnosis not present

## 2022-10-14 DIAGNOSIS — R918 Other nonspecific abnormal finding of lung field: Secondary | ICD-10-CM | POA: Diagnosis not present

## 2022-10-16 ENCOUNTER — Ambulatory Visit: Payer: Medicare HMO | Admitting: Podiatry

## 2022-10-16 DIAGNOSIS — Z940 Kidney transplant status: Principal | ICD-10-CM

## 2022-10-16 DIAGNOSIS — E1142 Type 2 diabetes mellitus with diabetic polyneuropathy: Secondary | ICD-10-CM

## 2022-10-16 DIAGNOSIS — M79675 Pain in left toe(s): Secondary | ICD-10-CM

## 2022-10-16 DIAGNOSIS — B351 Tinea unguium: Secondary | ICD-10-CM | POA: Diagnosis not present

## 2022-10-16 DIAGNOSIS — I739 Peripheral vascular disease, unspecified: Secondary | ICD-10-CM | POA: Diagnosis not present

## 2022-10-16 DIAGNOSIS — M79674 Pain in right toe(s): Secondary | ICD-10-CM | POA: Diagnosis not present

## 2022-10-16 MED ORDER — TACROLIMUS XR 1 MG TABLET,EXTENDED RELEASE 24 HR
ORAL_TABLET | 3 refills | 0 days | Status: CP
Start: 2022-10-16 — End: ?

## 2022-10-16 MED ORDER — TACROLIMUS XR 4 MG TABLET,EXTENDED RELEASE 24 HR
ORAL_TABLET | 3 refills | 0 days | Status: CP
Start: 2022-10-16 — End: ?

## 2022-10-16 NOTE — Patient Instructions (Addendum)
Aquaphor Lotion to be applied once daily to both feet avoiding application between toes.  You will get a call to schedule your circulation testing on your feet.

## 2022-10-17 ENCOUNTER — Other Ambulatory Visit: Payer: Self-pay | Admitting: Cardiovascular Disease

## 2022-10-17 MED ORDER — PREGABALIN 75 MG CAPSULE
ORAL_CAPSULE | ORAL | 3 refills | 30 days | Status: CP
Start: 2022-10-17 — End: ?
  Filled 2022-10-22: qty 90, 30d supply, fill #0

## 2022-10-20 ENCOUNTER — Encounter: Payer: Self-pay | Admitting: Podiatry

## 2022-10-20 DIAGNOSIS — Z940 Kidney transplant status: Principal | ICD-10-CM

## 2022-10-20 NOTE — Progress Notes (Signed)
  Subjective:  Patient ID: Theresa [name], female    DOB: November 23, 1955,  MRN: [id number]  Theresa [name] presents to clinic today for at risk foot care. Patient has h/o PAD and painful elongated mycotic toenails 1-5 bilaterally which are tender when wearing enclosed shoe gear. Pain is relieved with periodic professional debridement.  Chief Complaint  Patient presents with   Nail Problem    RFC,PCP-Walsh, Kenney Houseman, MD General-PCPLOV-04/2022-A1C-5.4     New problem(s): None.   PCP is Dana Allan, MD.  Allergies  Allergen Reactions   Hydrocodone Hives   Lisinopril     Unknown reaction     Review of Systems: Negative except as noted in the HPI.  Objective: No changes noted in today's physical examination. There were no vitals filed for this visit. Theresa [name] is a pleasant 67 y.o. female WD, WN in NAD. AAO x 3.  Vascular Examination: CFT <3 seconds b/l LE. Faintly palpable DP pulse(s) RLE. Faintly palpable PT pulse(s) right foot. Nonpalpable pedal pulses LLE. Lower extremity skin temperature gradient within normal limits. No edema noted b/l LE. No ischemia or gangrene noted b/l LE. No cyanosis or clubbing noted b/l LE.   Neurological Examination: Sensation grossly intact b/l with 10 gram monofilament. Vibratory sensation intact b/l.   Dermatological Examination: Pedal skin warm and supple b/l. No open wounds b/l. No interdigital macerations. Toenails 1-5 b/l thick, discolored, elongated with subungual debris and pain on dorsal palpation.    No hyperkeratotic nor porokeratotic lesions present on today's visit.  Musculoskeletal Examination: Muscle strength 5/5 to b/l LE. No pain, crepitus or joint limitation noted with ROM bilateral LE. HAV with bunion deformity noted b/l LE.  Radiographs: None  Last HgA1c:      Latest Ref Rng & Units 12/18/2021   11:59 AM  Hemoglobin A1C  Hemoglobin-A1c 4.0 - 5.6 % 5.9    Assessment/Plan: 1. Pain due to onychomycosis of toenails of both feet    2. PAD (peripheral artery disease) (HCC)   3. Diabetic polyneuropathy associated with type 2 diabetes mellitus (HCC)      VAS [place] ABI WITH/WO TBI -Patient was evaluated and treated. All patient's and/or POA's questions/concerns answered on today's visit. -Due to risk factor(s), ordered noninvasive arterial studies ABIs with and without TBIs for b/l lower extremities. -Patient to continue soft, supportive shoe gear daily. -Toenails 1-5 b/l were debrided in length and girth with sterile nail nippers and dremel without iatrogenic bleeding.  -Patient/POA to call should there be question/concern in the interim.   Return in about 3 months (around 01/16/2023).  Freddie Breech, DPM

## 2022-10-22 MED FILL — FUROSEMIDE 40 MG TABLET: ORAL | 90 days supply | Qty: 180 | Fill #2

## 2022-10-22 MED FILL — ATORVASTATIN 20 MG TABLET: ORAL | 90 days supply | Qty: 90 | Fill #1

## 2022-10-22 MED FILL — ESOMEPRAZOLE MAGNESIUM 20 MG CAPSULE,DELAYED RELEASE: ORAL | 90 days supply | Qty: 90 | Fill #2

## 2022-10-22 MED FILL — AMLODIPINE 10 MG TABLET: ORAL | 90 days supply | Qty: 90 | Fill #1

## 2022-10-22 MED FILL — SODIUM BICARBONATE 650 MG TABLET: ORAL | 90 days supply | Qty: 180 | Fill #6

## 2022-10-27 DIAGNOSIS — Z940 Kidney transplant status: Principal | ICD-10-CM

## 2022-11-03 DIAGNOSIS — Z940 Kidney transplant status: Principal | ICD-10-CM

## 2022-11-03 DIAGNOSIS — N186 End stage renal disease: Principal | ICD-10-CM

## 2022-11-10 DIAGNOSIS — Z940 Kidney transplant status: Principal | ICD-10-CM

## 2022-11-11 DIAGNOSIS — Z940 Kidney transplant status: Secondary | ICD-10-CM | POA: Diagnosis not present

## 2022-11-24 DIAGNOSIS — Z940 Kidney transplant status: Principal | ICD-10-CM

## 2022-11-27 ENCOUNTER — Telehealth: Payer: Self-pay

## 2022-11-27 NOTE — Telephone Encounter (Signed)
Patient is scheduled to see Dr. Aundria Rud on 7/23 and appt needs to be rescheduled. LVM for the patient to call back to r/s.

## 2022-11-28 NOTE — Telephone Encounter (Addendum)
Spoke to patient. She would like to cancel appt due to receiving a good CT report.  She will call back to reschedule if needed.  Appt canceled.  Nothing further needed.

## 2022-12-01 DIAGNOSIS — Z940 Kidney transplant status: Principal | ICD-10-CM

## 2022-12-01 DIAGNOSIS — N186 End stage renal disease: Principal | ICD-10-CM

## 2022-12-02 MED FILL — PREGABALIN 75 MG CAPSULE: ORAL | 30 days supply | Qty: 90 | Fill #1

## 2022-12-03 ENCOUNTER — Ambulatory Visit: Admit: 2022-12-03 | Discharge: 2022-12-03 | Payer: MEDICARE

## 2022-12-03 DIAGNOSIS — Z940 Kidney transplant status: Principal | ICD-10-CM

## 2022-12-03 DIAGNOSIS — N271 Small kidney, bilateral: Secondary | ICD-10-CM | POA: Diagnosis not present

## 2022-12-08 DIAGNOSIS — Z940 Kidney transplant status: Principal | ICD-10-CM

## 2022-12-15 DIAGNOSIS — Z940 Kidney transplant status: Principal | ICD-10-CM

## 2022-12-17 ENCOUNTER — Ambulatory Visit (INDEPENDENT_AMBULATORY_CARE_PROVIDER_SITE_OTHER): Payer: Medicare HMO | Admitting: *Deleted

## 2022-12-17 ENCOUNTER — Other Ambulatory Visit: Payer: Self-pay | Admitting: Cardiovascular Disease

## 2022-12-17 ENCOUNTER — Telehealth: Payer: Self-pay | Admitting: Cardiovascular Disease

## 2022-12-17 VITALS — Ht 67.5 in | Wt 154.0 lb

## 2022-12-17 DIAGNOSIS — Z0000 Encounter for general adult medical examination without abnormal findings: Secondary | ICD-10-CM

## 2022-12-17 MED ORDER — CLONIDINE HCL 0.1 MG PO TABS: ORAL_TABLET | ORAL | 0 refills | Status: DC

## 2022-12-17 NOTE — Patient Instructions (Signed)
Theresa [name] , Thank you for taking time to come for your Medicare Wellness Visit. I appreciate your ongoing commitment to your health goals. Please review the following plan we discussed and let me know if I can assist you in the future.   These are the goals we discussed:  Goals      Healthy Lifestyle     Stay active. Stay hydrated. Healthy diet.     Patient Stated     Would like to get into a gym that can help her get her strength built back up.        This is a list of the screening recommended for you and due dates:  Health Maintenance  Topic Date Due   COVID-19 Vaccine (8 - 2023-24 season) 05/16/2022   Hemoglobin A1C  06/20/2022   DEXA scan (bone density measurement)  Never done   Complete foot exam   11/22/2022   Flu Shot  01/08/2023   Pneumonia Vaccine (4 of 4 - PPSV23 or PCV20) 03/24/2023   Eye exam for diabetics  07/11/2023   Medicare Annual Wellness Visit  12/17/2023   Colon Cancer Screening  09/06/2024   Mammogram  09/29/2024   DTaP/Tdap/Td vaccine (2 - Td or Tdap) 08/18/2031   Hepatitis C Screening  Completed   HIV Screening  Completed   Zoster (Shingles) Vaccine  Completed   HPV Vaccine  Aged Out   Screening for Lung Cancer  Discontinued   Pap Smear  Discontinued    Advanced directives: Declined  Conditions/risks identified: None  Next appointment: Follow up in one year for your annual wellness visit 12/23/23 @ 1:15.   Preventive Care 37 Years and Older, Female Preventive care refers to lifestyle choices and visits with your health care provider that can promote health and wellness. What does preventive care include? A yearly physical exam. This is also called an annual well check. Dental exams once or twice a year. Routine eye exams. Ask your health care provider how often you should have your eyes checked. Personal lifestyle choices, including: Daily care of your teeth and gums. Regular physical activity. Eating a healthy diet. Avoiding tobacco and  drug use. Limiting alcohol use. Practicing safe sex. Taking low-dose aspirin every day. Taking vitamin and mineral supplements as recommended by your health care provider. What happens during an annual well check? The services and screenings done by your health care provider during your annual well check will depend on your age, overall health, lifestyle risk factors, and family history of disease. Counseling  Your health care provider may ask you questions about your: Alcohol use. Tobacco use. Drug use. Emotional well-being. Home and relationship well-being. Sexual activity. Eating habits. History of falls. Memory and ability to understand (cognition). Work and work Astronomer. Reproductive health. Screening  You may have the following tests or measurements: Height, weight, and BMI. Blood pressure. Lipid and cholesterol levels. These may be checked every 5 years, or more frequently if you are over 46 years old. Skin check. Lung cancer screening. You may have this screening every year starting at age 53 if you have a 30-pack-year history of smoking and currently smoke or have quit within the past 15 years. Fecal occult blood test (FOBT) of the stool. You may have this test every year starting at age 6. Flexible sigmoidoscopy or colonoscopy. You may have a sigmoidoscopy every 5 years or a colonoscopy every 10 years starting at age 79. Hepatitis C blood test. Hepatitis B blood test. Sexually transmitted disease (STD)  testing. Diabetes screening. This is done by checking your blood sugar (glucose) after you have not eaten for a while (fasting). You may have this done every 1-3 years. Bone density scan. This is done to screen for osteoporosis. You may have this done starting at age 38. Mammogram. This may be done every 1-2 years. Talk to your health care provider about how often you should have regular mammograms. Talk with your health care provider about your test results, treatment  options, and if necessary, the need for more tests. Vaccines  Your health care provider may recommend certain vaccines, such as: Influenza vaccine. This is recommended every year. Tetanus, diphtheria, and acellular pertussis (Tdap, Td) vaccine. You may need a Td booster every 10 years. Zoster vaccine. You may need this after age 77. Pneumococcal 13-valent conjugate (PCV13) vaccine. One dose is recommended after age 4. Pneumococcal polysaccharide (PPSV23) vaccine. One dose is recommended after age 67. Talk to your health care provider about which screenings and vaccines you need and how often you need them. This information is not intended to replace advice given to you by your health care provider. Make sure you discuss any questions you have with your health care provider. Document Released: 06/22/2015 Document Revised: 02/13/2016 Document Reviewed: 03/27/2015 Elsevier Interactive Patient Education  2017 [employer].  Fall Prevention in the Home Falls can cause injuries. They can happen to people of all ages. There are many things you can do to make your home safe and to help prevent falls. What can I do on the outside of my home? Regularly fix the edges of walkways and driveways and fix any cracks. Remove anything that might make you trip as you walk through a door, such as a raised step or threshold. Trim any bushes or trees on the path to your home. Use bright outdoor lighting. Clear any walking paths of anything that might make someone trip, such as rocks or tools. Regularly check to see if handrails are loose or broken. Make sure that both sides of any steps have handrails. Any raised decks and porches should have guardrails on the edges. Have any leaves, snow, or ice cleared regularly. Use sand or salt on walking paths during winter. Clean up any spills in your garage right away. This includes oil or grease spills. What can I do in the bathroom? Use night lights. Install grab  bars by the toilet and in the tub and shower. Do not use towel bars as grab bars. Use non-skid mats or decals in the tub or shower. If you need to sit down in the shower, use a plastic, non-slip stool. Keep the floor dry. Clean up any water that spills on the floor as soon as it happens. Remove soap buildup in the tub or shower regularly. Attach bath mats securely with double-sided non-slip rug tape. Do not have throw rugs and other things on the floor that can make you trip. What can I do in the bedroom? Use night lights. Make sure that you have a light by your bed that is easy to reach. Do not use any sheets or blankets that are too big for your bed. They should not hang down onto the floor. Have a firm chair that has side arms. You can use this for support while you get dressed. Do not have throw rugs and other things on the floor that can make you trip. What can I do in the kitchen? Clean up any spills right away. Avoid walking on wet  floors. Keep items that you use a lot in easy-to-reach places. If you need to reach something above you, use a strong step stool that has a grab bar. Keep electrical cords out of the way. Do not use floor polish or wax that makes floors slippery. If you must use wax, use non-skid floor wax. Do not have throw rugs and other things on the floor that can make you trip. What can I do with my stairs? Do not leave any items on the stairs. Make sure that there are handrails on both sides of the stairs and use them. Fix handrails that are broken or loose. Make sure that handrails are as long as the stairways. Check any carpeting to make sure that it is firmly attached to the stairs. Fix any carpet that is loose or worn. Avoid having throw rugs at the top or bottom of the stairs. If you do have throw rugs, attach them to the floor with carpet tape. Make sure that you have a light switch at the top of the stairs and the bottom of the stairs. If you do not have them,  ask someone to add them for you. What else can I do to help prevent falls? Wear shoes that: Do not have high heels. Have rubber bottoms. Are comfortable and fit you well. Are closed at the toe. Do not wear sandals. If you use a stepladder: Make sure that it is fully opened. Do not climb a closed stepladder. Make sure that both sides of the stepladder are locked into place. Ask someone to hold it for you, if possible. Clearly mark and make sure that you can see: Any grab bars or handrails. First and last steps. Where the edge of each step is. Use tools that help you move around (mobility aids) if they are needed. These include: Canes. Walkers. Scooters. Crutches. Turn on the lights when you go into a dark area. Replace any light bulbs as soon as they burn out. Set up your furniture so you have a clear path. Avoid moving your furniture around. If any of your floors are uneven, fix them. If there are any pets around you, be aware of where they are. Review your medicines with your doctor. Some medicines can make you feel dizzy. This can increase your chance of falling. Ask your doctor what other things that you can do to help prevent falls. This information is not intended to replace advice given to you by your health care provider. Make sure you discuss any questions you have with your health care provider. Document Released: 03/22/2009 Document Revised: 11/01/2015 Document Reviewed: 06/30/2014 Elsevier Interactive Patient Education  2017 [employer].

## 2022-12-17 NOTE — Telephone Encounter (Signed)
*  STAT* If patient is at the pharmacy, call can be transferred to refill team.   1. Which medications need to be refilled? (please list name of each medication and dose if known) cloNIDine (CATAPRES) 0.1 MG tablet  2. Which pharmacy/location (including street and city if local pharmacy) is medication to be sent to? [facility] [address], [place] - [address]  3. Do they need a 30 day or 90 day supply?  90 day supply

## 2022-12-17 NOTE — Telephone Encounter (Signed)
Requested Prescriptions   Signed Prescriptions Disp Refills   cloNIDine (CATAPRES) 0.1 MG tablet 200 tablet 0    Sig: TAKE 1 TABLET BY MOUTH TWICE DAILY (MAY  TAKE  AN  EXTRA  ONE  TABLET  FOR  BLOOD  PRESSURE  OVER  160)    Authorizing Provider: Antonieta Iba    Ordering User: NEWCOMER MCCLAIN, Badr Piedra L

## 2022-12-17 NOTE — Progress Notes (Addendum)
Subjective:   Theresa [name] is a 67 y.o. female who presents for Medicare Annual (Subsequent) preventive examination.  Visit Complete: Virtual  I connected with  Theresa [name] on 12/17/22 by a audio enabled telemedicine application and verified that I am speaking with the correct person using two identifiers.  Patient Location: Home  Provider Location: Office/Clinic  I discussed the limitations of evaluation and management by telemedicine. The patient expressed understanding and agreed to proceed.   Review of Systems     Cardiac Risk Factors include: advanced age (>50men, >67 women);diabetes mellitus;hypertension;dyslipidemia;sedentary lifestyle;Other (see comment), Risk factor comments: CHF, Heart Murmur, History of MI 2014     Objective:    Today's Vitals   12/17/22 1341  Weight: 154 lb (69.9 kg)  Height: 5' 7.5" (1.715 m)   Body mass index is 23.76 kg/m.     12/17/2022    2:01 PM 12/13/2021   10:28 AM 09/06/2021   12:35 PM 06/07/2021    3:06 PM 05/28/2021    7:55 AM 05/18/2021    7:20 PM 12/03/2020    1:43 PM  Advanced Directives  Does Patient Have a Medical Advance Directive? No No No No No No Yes  Type of Advance Directive       Healthcare Power of Attorney  Does patient want to make changes to medical advance directive?       No - Patient declined  Copy of Healthcare Power of Attorney in Chart?       No - copy requested  Would patient like information on creating a medical advance directive? No - Patient declined No - Patient declined No - Patient declined  No - Patient declined No - Patient declined     Current Medications (verified) Outpatient Encounter Medications as of 12/17/2022  Medication Sig   acetaminophen (TYLENOL) 500 MG tablet Take 1,000 mg by mouth daily as needed.   albuterol (PROVENTIL) (2.5 MG/3ML) 0.083% nebulizer solution Take 3 mLs (2.5 mg total) by nebulization every 6 (six) hours as needed for wheezing or shortness of breath.   atorvastatin  (LIPITOR) 20 MG tablet Take 1 tablet (20 mg total) by mouth daily.   cloNIDine (CATAPRES) 0.1 MG tablet TAKE 1 TABLET BY MOUTH TWICE DAILY (MAY  TAKE  AN  EXTRA  ONE  TABLET  FOR  BLOOD  PRESSURE  OVER  160)   ENVARSUS XR 1 MG TB24 Take 3 mg by mouth daily as needed. Total dose is 9 mg every morning at 0900.   esomeprazole (NEXIUM) 20 MG capsule Take 20 mg by mouth daily.   isosorbide mononitrate (IMDUR) 30 MG 24 hr tablet Take 1 tablet by mouth once daily   nitroGLYCERIN (NITROSTAT) 0.4 MG SL tablet Place 1 tablet (0.4 mg total) under the tongue every 5 (five) minutes as needed for chest pain.   pregabalin (LYRICA) 75 MG capsule Take 75 mg by mouth 2 (two) times daily.   sodium bicarbonate 650 MG tablet Take 650 mg by mouth 2 (two) times daily.   tacrolimus ER (ENVARSUS XR) 1 MG TB24 Take 1 mg by mouth daily.   tacrolimus ER (ENVARSUS XR) 4 MG TB24 Take 8 mg by mouth daily before breakfast. Total dose is 9 mg daily in the morning.   tacrolimus ER (ENVARSUS XR) 4 MG TB24 Take 8 mg by mouth daily.   Sodium Zirconium Cyclosilicate (LOKELMA PO) Take by mouth in the morning and at bedtime. (Patient not taking: Reported on 12/17/2022)   No  facility-administered encounter medications on file as of 12/17/2022.    Allergies (verified) Hydrocodone and Lisinopril   History: Past Medical History:  Diagnosis Date   Anemia of chronic disease    Anginal pain (HCC)    Breast calcification seen on mammogram 04/08/2021   Carotid arterial disease (HCC)    a. 02/2013 U/S: 40-59% bilat ICA stenosis.   Chronic constipation    Chronic diastolic CHF (congestive heart failure) (HCC)    a. 10/2013 Echo [facility]): EF 55-60%, mod conc LVH, mod MR, mildly dil LA, mild Ao sclerosis w/o stenosis; b. 02/2016 Echo: EF 55-60%, no rwma, Gr DD, mild AS, mod to sev MR, mildly dil LA, PASP ; c. 07/2017 Echo [facility]): EF >55%, mild to mod LVH, Gr2 DD, Ao scl, Mod MR, mod dil LA, nl RV fxn.   Colon polyps    COPD (chronic  obstructive pulmonary disease) (HCC)    Coronary artery disease    a. 05/2013 NSTEMI/PCI: LM 20d, LAD min irregs, LCX small, nl, OM1 nl, RCA dom 67m (2.5x16 Promus DES), PDA1 80p; b. 04/2015 Cath: LM nl, LAD 74m, D1/2 min irregs, LCX 35p/m, OM2/3 min irregs, RCA patent mid stent, RPDA 70ost, RPLB1 30, RPLB2/3 min irregs, EF 55-65%--> Med Rx; c. 06/2017 MV [facility]): No ischemia/infarct, EF 64%.   Coronary artery disease of native artery of native heart with stable angina pectoris (HCC) 06/27/2013   S/p drug eluting stent RCA 05/2013   COVID-19    06/2021   Diabetes mellitus    Diabetic neuropathy (HCC)    Diabetic retinopathy (HCC) 05/28/2013   Hx bilat retinal detachment, proliferative diab retinopathy and bilat vitreous hemorrhage    Emphysema    ESRD on hemodialysis (HCC)    a. [facility] in [place], [place] [phone]/Dr. [name], on a MWF schedule.  She started dialysis in Feb 2014.  Etiology of renal failure not known, likely diabetes.  Has a left upper arm AV graft.   History of bronchitis    Mar 2012   History of heart attack 01/05/2018   History of pneumonia    June 2012   History of tobacco abuse    a. Quit 2012.   Hypercalcemia 02/27/2020   Hyperkalemia 04/18/2020   Hyperlipidemia    Hypertension    Hyponatremia 05/22/2021   Moderate to severe mitral insufficiency    a. 10/2013 Echo: EF 55-60%, mod MR; b. 02/2016 Echo: EF 55-60%, mod to sev MR directed posteriorly--felt to be dynamic -worse with volume overload; c. 07/2017 Echo [facility]): EF >55%. Mod MR.   Myocardial infarct [facility]) 05/2013   Neuritis or radiculitis due to rupture of lumbar intervertebral disc 11/28/2014   Peripheral vascular disease (HCC)    Sickle cell trait (HCC)    Thyroid nodule    [place] 06/2017 due to f/u US in 1 year    Valvular heart disease    echo 06/17/21 mild to moderate   Past Surgical History:  Procedure Laterality Date   A/V FISTULAGRAM Left 11/10/2017   Procedure: A/V FISTULAGRAM;  Surgeon: Theresa Dills, MD;  Location: [facility] INVASIVE CV LAB;  Service: Cardiovascular;  Laterality: Left;   A/V SHUNTOGRAM Left 02/08/2019   Procedure: A/V SHUNTOGRAM;  Surgeon: Theresa Dills, MD;  Location: [facility] INVASIVE CV LAB;  Service: Cardiovascular;  Laterality: Left;   ABDOMINAL HYSTERECTOMY     2000 for cysts on ovaries total no abnormal pap   CARDIAC CATHETERIZATION     CARDIAC CATHETERIZATION N/A 05/02/2015   Procedure: Left  Heart Cath and Coronary Angiography;  Surgeon: Iran Ouch, MD;  Location: [facility] INVASIVE CV LAB;  Service: Cardiovascular;  Laterality: N/A;   COLONOSCOPY WITH PROPOFOL N/A 07/08/2016   Procedure: COLONOSCOPY WITH PROPOFOL;  Surgeon: Wyline Mood, MD;  Location: [facility] ENDOSCOPY;  Service: Endoscopy;  Laterality: N/A;   COLONOSCOPY WITH PROPOFOL N/A 08/20/2017   Procedure: COLONOSCOPY WITH PROPOFOL;  Surgeon: Wyline Mood, MD;  Location: [facility] ENDOSCOPY;  Service: Gastroenterology;  Laterality: N/A;   COLONOSCOPY WITH PROPOFOL N/A 09/06/2021   Procedure: COLONOSCOPY WITH PROPOFOL;  Surgeon: Wyline Mood, MD;  Location: [facility] ENDOSCOPY;  Service: Gastroenterology;  Laterality: N/A;   colonscopy     CORONARY ANGIOPLASTY  05/28/2014   stent placement to the mid RCA   CYSTOURETHROSCOPY     [facility] urology with stent placement 11 or 05/2019 and stent removal 06/13/19    DILATION AND CURETTAGE OF UTERUS     several in the early 80's   ESOPHAGOGASTRODUODENOSCOPY     2012   ESOPHAGOGASTRODUODENOSCOPY N/A 09/06/2021   Procedure: ESOPHAGOGASTRODUODENOSCOPY (EGD);  Surgeon: Wyline Mood, MD;  Location: [facility] ENDOSCOPY;  Service: Gastroenterology;  Laterality: N/A;   ESOPHAGOGASTRODUODENOSCOPY (EGD) WITH PROPOFOL N/A 03/11/2018   Procedure: ESOPHAGOGASTRODUODENOSCOPY (EGD) WITH PROPOFOL;  Surgeon: Wyline Mood, MD;  Location: [facility] ENDOSCOPY;  Service: Gastroenterology;  Laterality: N/A;   EYE SURGERY     bilateral laser 2012   EYE SURGERY     right   EYE SURGERY     x4 both eyes   GAS  INSERTION  09/30/2011   Procedure: INSERTION OF GAS;  Surgeon: Sherrie George, MD;  Location: [facility] OR;  Service: Ophthalmology;  Laterality: Right;  [id number]   GAS/FLUID EXCHANGE  09/30/2011   Procedure: GAS/FLUID EXCHANGE;  Surgeon: Sherrie George, MD;  Location: [facility] OR;  Service: Ophthalmology;  Laterality: Right;   KIDNEY TRANSPLANT     03/30/19 Dr. Vernona Rieger Thomas/Dr. Trinna Post Zendel/Dr. Sundra Aland; [facility] transplant Dr. Clelia Croft will be primary [phone] coordinator Sarah Wynkoop 984 [phone]   KIDNEY TRANSPLANT     03/30/19 [facility]   LEFT HEART CATHETERIZATION WITH CORONARY ANGIOGRAM N/A 05/28/2013   Procedure: LEFT HEART CATHETERIZATION WITH CORONARY ANGIOGRAM;  Surgeon: Corky Crafts, MD;  Location: [facility] CATH LAB;  Service: Cardiovascular;  Laterality: N/A;   PARS PLANA VITRECTOMY  04/22/2011   Procedure: PARS PLANA VITRECTOMY WITH 25 GAUGE;  Surgeon: Sherrie George, MD;  Location: [facility] OR;  Service: Ophthalmology;  Laterality: Left;  membrane peel, endolaser, gas fluid exchange, silicone oil, repair of complex traction retinal detachment   PARS PLANA VITRECTOMY  09/30/2011   Procedure: PARS PLANA VITRECTOMY WITH 25 GAUGE;  Surgeon: Sherrie George, MD;  Location: [facility] OR;  Service: Ophthalmology;  Laterality: Right;  Endolaser; Repair of Complex Traction Retinal Detachment   PARS PLANA VITRECTOMY  02/24/2012   Procedure: PARS PLANA VITRECTOMY WITH 25 GAUGE;  Surgeon: Sherrie George, MD;  Location: [facility] OR;  Service: Ophthalmology;  Laterality: Left;   PTCA     SILICON OIL REMOVAL  02/24/2012   Procedure: SILICON OIL REMOVAL;  Surgeon: Sherrie George, MD;  [facility];  Service: Ophthalmology;  Laterality: Left;   THROMBECTOMY / ARTERIOVENOUS GRAFT REVISION     TUBAL LIGATION     1979   UPPER EXTREMITY ANGIOGRAPHY Left 02/08/2019   Procedure: UPPER EXTREMITY ANGIOGRAPHY;  Surgeon: Theresa Dills, MD;  Location: [facility] INVASIVE CV LAB;  Service: Cardiovascular;  Laterality: Left;   Family  History  Problem Relation Age of Onset   Stroke Mother    Heart attack Mother    Heart disease Mother    Colon cancer Father    Colon cancer Sister    Breast cancer Sister    Heart attack Brother    Stroke Brother    Diabetes Brother    Diabetes Brother    Diabetes Daughter    Renal Disease Daughter        on HD   Kidney failure Daughter        age 63 as of 12/03/20   Anesthesia problems Neg Hx    Hypotension Neg Hx    Malignant hyperthermia Neg Hx    Pseudochol deficiency Neg Hx    Social History   Socioeconomic History   Marital status: Single    Spouse name: Not on file   Number of children: 2   Years of education: Not on file   Highest education level: Not on file  Occupational History   Not on file  Tobacco Use   Smoking status: Former    Packs/day: 1.00    Years: 25.00    Additional pack years: 0.00    Total pack years: 25.00    Types: Cigarettes    Quit date: 03/30/2019    Years since quitting: 3.7   Smokeless tobacco: Never  Vaping Use   Vaping Use: Never used  Substance and Sexual Activity   Alcohol use: No   Drug use: Yes    Types: Marijuana    Comment: daily   Sexual activity: Never  Other Topics Concern   Not on file  Social History Narrative   On disability.    Lives with son Vaughan Basta   2 children (has son and daughter)      Shari Heritage drives her since her vision has decreased   Social Determinants of Health   Financial Resource Strain: Low Risk  (12/17/2022)   Overall Financial Resource Strain (CARDIA)    Difficulty of Paying Living Expenses: Not hard at all  Food Insecurity: No Food Insecurity (12/17/2022)   Hunger Vital Sign    Worried About Running Out of Food in the Last Year: Never true    Ran Out of Food in the Last Year: Never true  Transportation Needs: No Transportation Needs (12/17/2022)   PRAPARE - Administrator, Civil Service (Medical): No    Lack of Transportation (Non-Medical): No  Physical Activity: Inactive  (12/17/2022)   Exercise Vital Sign    Days of Exercise per Week: 0 days    Minutes of Exercise per Session: 0 min  Stress: No Stress Concern Present (12/17/2022)   [employer] of Occupational Health - Occupational Stress Questionnaire    Feeling of Stress : Not at all  Social Connections: Socially Isolated (12/17/2022)   Social Connection and Isolation Panel [NHANES]    Frequency of Communication with Friends and Family: More than three times a week    Frequency of Social Gatherings with Friends and Family: More than three times a week    Attends Religious Services: Never    Database administrator or Organizations: No    Attends Banker Meetings: Never    Marital Status: Widowed    Tobacco Counseling Counseling given: Not Answered   Clinical Intake:  Pre-visit preparation completed: Yes  Pain : No/denies pain     BMI - recorded: 23.76 Nutritional Status: BMI of 19-24  Normal Nutritional Risks: None Diabetes: No  How often  do you need to have someone help you when you read instructions, pamphlets, or other written materials from your doctor or pharmacy?: 1 - Never  Interpreter Needed?: No  Information entered by :: R. Iriana Artley LPN   Activities of Daily Living    12/17/2022    1:45 PM  In your present state of health, do you have any difficulty performing the following activities:  Hearing? 0  Vision? 1  Comment visual problems since 2010  Difficulty concentrating or making decisions? 1  Comment some memory issues  Walking or climbing stairs? 1  Comment tries not to climb stairs  Dressing or bathing? 0  Doing errands, shopping? 1  Comment does not drive but has family that takes her places  Quarry manager and eating ? Y  Comment son helps with her food  Using the Toilet? N  In the past six months, have you accidently leaked urine? N  Do you have problems with loss of bowel control? Y  Comment diarrhea a lot  Managing your Medications? N   Managing your Finances? N  Housekeeping or managing your Housekeeping? Y  Comment son helps    Patient Care Team: Dana Allan, MD as PCP - General (Family Medicine) Mariah Milling Tollie Pizza, MD as PCP - Cardiology (Cardiology) Delma Freeze, FNP as Nurse Practitioner (Family Medicine) Antonieta Iba, MD as Consulting Physician (Cardiology) Mady Haagensen, MD as Consulting Physician (Nephrology)  Indicate any recent Medical Services you may have received from other than [facility] providers in the past year (date may be approximate).     Assessment:   This is a routine wellness examination for [place].  Hearing/Vision screen Hearing Screening - Comments:: No issues Vision Screening - Comments:: Visual issues  Dietary issues and exercise activities discussed:     Goals Addressed             This Visit's Progress    Patient Stated       Would like to get into a gym that can help her get her strength built back up.       Depression Screen    12/17/2022    1:54 PM 04/21/2022   11:49 AM 12/18/2021   11:18 AM 12/13/2021   10:22 AM 12/03/2020    1:42 PM 12/01/2019    1:10 PM 06/16/2019    2:13 PM  PHQ 2/9 Scores  PHQ - 2 Score 0 0 1 0 0 0 0  PHQ- 9 Score 6 7         Fall Risk    12/17/2022    2:05 PM 04/21/2022   11:15 AM 01/08/2022    2:40 PM 12/18/2021   11:18 AM 12/13/2021   10:29 AM  Fall Risk   Falls in the past year? 0 0 0 0 0  Number falls in past yr: 0 0  0 0  Injury with Fall? 0 0  0   Risk for fall due to : No Fall Risks No Fall Risks  No Fall Risks Impaired vision  Follow up Falls prevention discussed;Falls evaluation completed Falls evaluation completed  Falls evaluation completed Falls evaluation completed    MEDICARE RISK AT HOME:  Medicare Risk at Home - 12/17/22 1405     Any stairs in or around the home? Yes    If so, are there any without handrails? Yes    Home free of loose throw rugs in walkways, pet beds, electrical cords, etc? Yes    Adequate  lighting in your  home to reduce risk of falls? Yes    Life alert? No    Use of a cane, walker or w/c? Yes   use a cane   Grab bars in the bathroom? No    Shower chair or bench in shower? Yes    Elevated toilet seat or a handicapped toilet? Yes              Cognitive Function:        12/17/2022    2:03 PM 12/03/2020    1:50 PM 12/01/2019    1:14 PM 03/04/2018    1:03 PM 02/24/2017   12:00 PM  6CIT Screen  What Year? 0 points 0 points 0 points 0 points 0 points  What month? 0 points 0 points 0 points 0 points 0 points  What time? 0 points 0 points  0 points 0 points  Count back from 20 0 points 0 points  0 points 0 points  Months in reverse 0 points 0 points 0 points 0 points 0 points  Repeat phrase 0 points  0 points 0 points 0 points  Total Score 0 points   0 points 0 points    Immunizations Immunization History  Administered Date(s) Administered   Covid-19, Mrna,Vaccine(Spikevax)65yrs and older 03/21/2022   Hepatitis B 12/16/2012, 01/15/2013, 03/05/2013, 06/18/2013   Hepatitis B, ADULT 12/16/2012, 01/15/2013, 03/05/2013, 06/18/2013   Influenza Nasal 03/06/2015   Influenza Split 02/25/2012, 02/07/2013, 02/19/2015, 03/06/2015   Influenza, High Dose Seasonal PF 02/09/2018   Influenza,inj,Quad PF,6+ Mos 02/21/2014, 02/24/2017, 04/04/2020, 04/11/2021, 03/21/2022   Influenza-Unspecified 02/25/2012, 03/01/2018, 03/18/2019, 04/04/2020   PFIZER(Purple Top)SARS-COV-2 Vaccination 02/11/2020, 03/03/2020, 09/12/2020, 01/10/2021   PPD Test 07/16/2012, 11/12/2012, 01/17/2015, 05/12/2016, 05/13/2017, 05/12/2018   Pfizer Covid-19 Vaccine Bivalent Booster 58yrs & up 04/26/2021, 12/20/2021   Pneumococcal Conjugate-13 05/15/2020   Pneumococcal Polysaccharide-23 06/09/2010, 03/23/2018   Pneumococcal-Unspecified 06/09/2010, 07/04/2015, 03/23/2018   Tdap 08/17/2021   Tetanus 06/09/2010   Zoster Recombinant(Shingrix) 08/17/2021, 12/18/2021    TDAP status: Up to date  Flu Vaccine status:  Up to date  Pneumococcal vaccine status: Up to date  Covid-19 vaccine status: Completed vaccines  Qualifies for Shingles Vaccine? Yes   Zostavax completed No   Shingrix Completed?: Yes  Screening Tests Health Maintenance  Topic Date Due   COVID-19 Vaccine (8 - 2023-24 season) 05/16/2022   HEMOGLOBIN A1C  06/20/2022   DEXA SCAN  Never done   FOOT EXAM  11/22/2022   INFLUENZA VACCINE  01/08/2023   Pneumonia Vaccine 40+ Years old (4 of 4 - PPSV23 or PCV20) 03/24/2023   OPHTHALMOLOGY EXAM  07/11/2023   Medicare Annual Wellness (AWV)  12/17/2023   Colonoscopy  09/06/2024   MAMMOGRAM  09/29/2024   DTaP/Tdap/Td (2 - Td or Tdap) 08/18/2031   Hepatitis C Screening  Completed   HIV Screening  Completed   Zoster Vaccines- Shingrix  Completed   HPV VACCINES  Aged Out   Lung Cancer Screening  Discontinued   PAP SMEAR-Modifier  Discontinued    Health Maintenance  Health Maintenance Due  Topic Date Due   COVID-19 Vaccine (8 - 2023-24 season) 05/16/2022   HEMOGLOBIN A1C  06/20/2022   DEXA SCAN  Never done   FOOT EXAM  11/22/2022    Colorectal cancer screening: Type of screening: Colonoscopy. Completed 3/23. Repeat every 3 years  Mammogram status: Completed 4/24. Repeat every year  BONE DENSITY TEST Has not had one, will discuss with PCP at next visit  Lung Cancer Screening: (Low Dose CT Chest recommended  if Age 84-80 years, 20 pack-year currently smoking OR have quit w/in 15years.) does quality, Completed 5/24   Additional Screening:  Hepatitis C Screening: does qualify; Completed 6/14  Vision Screening: Recommended annual ophthalmology exams for early detection of glaucoma and other disorders of the eye. Is the patient up to date with their annual eye exam?  Yes  Who is the provider or what is the name of the office in which the patient attends annual eye exams? [facility] If pt is not established with a provider, would they like to be referred to a provider to  establish care? No .   Dental Screening: Recommended annual dental exams for proper oral hygiene  Diabetic Foot Exam: Diabetic Foot Exam: Overdue, Pt has been advised about the importance in completing this exam. Pt is scheduled for diabetic foot exam on needs at next office visit.  Community Resource Referral / Chronic Care Management: CRR required this visit?  No   CCM required this visit?  No     Plan:     I have personally reviewed and noted the following in the patient's chart:   Medical and social history Use of alcohol, tobacco or illicit drugs  Current medications and supplements including opioid prescriptions. Patient is not currently taking opioid prescriptions. Functional ability and status Nutritional status Physical activity Advanced directives List of other physicians Hospitalizations, surgeries, and ER visits in previous 12 months Vitals Screenings to include cognitive, depression, and falls Referrals and appointments  In addition, I have reviewed and discussed with patient certain preventive protocols, quality metrics, and best practice recommendations. A written personalized care plan for preventive services as well as general preventive health recommendations were provided to patient.     Sydell Axon, LPN   1/61/0960   After Visit Summary: (Declined) Due to this being a telephonic visit, with patients personalized plan was offered to patient but patient Declined AVS at this time   Nurse Notes: Patient will discuss trying to set up PT at next visit. Patient will talk with PCP about a bone density at next visit.     I have reviewed the above information and agree with above.   Duncan Dull, MD

## 2022-12-22 ENCOUNTER — Encounter: Payer: Medicare Other | Admitting: Family Medicine

## 2022-12-22 DIAGNOSIS — Z940 Kidney transplant status: Principal | ICD-10-CM

## 2022-12-22 DIAGNOSIS — Z780 Asymptomatic menopausal state: Secondary | ICD-10-CM

## 2022-12-22 DIAGNOSIS — Z1231 Encounter for screening mammogram for malignant neoplasm of breast: Secondary | ICD-10-CM

## 2022-12-29 DIAGNOSIS — Z940 Kidney transplant status: Principal | ICD-10-CM

## 2022-12-29 DIAGNOSIS — N186 End stage renal disease: Principal | ICD-10-CM

## 2022-12-30 ENCOUNTER — Ambulatory Visit: Payer: Medicare HMO | Admitting: Student in an Organized Health Care Education/Training Program

## 2022-12-31 ENCOUNTER — Ambulatory Visit (INDEPENDENT_AMBULATORY_CARE_PROVIDER_SITE_OTHER): Payer: Medicare HMO | Admitting: Family Medicine

## 2022-12-31 ENCOUNTER — Encounter: Payer: Self-pay | Admitting: Family Medicine

## 2022-12-31 VITALS — BP 128/68 | HR 72 | Temp 98.0°F | Ht 67.5 in | Wt 166.8 lb

## 2022-12-31 DIAGNOSIS — Z780 Asymptomatic menopausal state: Secondary | ICD-10-CM

## 2022-12-31 DIAGNOSIS — Z23 Encounter for immunization: Secondary | ICD-10-CM | POA: Diagnosis not present

## 2022-12-31 DIAGNOSIS — J449 Chronic obstructive pulmonary disease, unspecified: Secondary | ICD-10-CM | POA: Diagnosis not present

## 2022-12-31 DIAGNOSIS — Z0000 Encounter for general adult medical examination without abnormal findings: Secondary | ICD-10-CM

## 2022-12-31 MED ORDER — ALBUTEROL SULFATE (2.5 MG/3ML) 0.083% IN NEBU
2.5000 mg | INHALATION_SOLUTION | Freq: Four times a day (QID) | RESPIRATORY_TRACT | 11 refills | Status: DC | PRN
Start: 2022-12-31 — End: 2023-06-08

## 2022-12-31 MED FILL — PREGABALIN 75 MG CAPSULE: ORAL | 30 days supply | Qty: 90 | Fill #2

## 2022-12-31 NOTE — Progress Notes (Signed)
SUBJECTIVE:   Chief Complaint  Patient presents with   Annual Exam    CPE   HPI Patient presents to clinic annual physical  No acute concerns today Reports doing well. Was not really sue why she was here.   Patient reports she has multiple providers.  ESRD/renal transplant Follows with nephrology at [facility].    Diabetic retinopathy Follows with Dr. Senaida Ores  Diabetic neuropathy Follows at [facility] foot care.   PAD/CAD/CHF/hypertension/valvular disease Follows with cardiology.    OSA/lung nodule Follows with pulmonology at [facility].  Quit smoking in 2014.  Takes albuterol inhaler as needed. Requesting refill for albuterol solution.  Diabetes type 2 Last A1c 6.1  Currently asymptomatic.  Diet controlled.  Not on statin therapy.  Chronic pain Endorses marijuana use and daily for chronic pain.  Review of Systems - General ROS: negative    PERTINENT PMH / PSH: ESRD on dialysis status post renal transplant Hypertension Diabetes type 2 CAD status post stent Hyperlipidemia PAD COPD OSA Chronic pain  OBJECTIVE:  BP 128/68 (BP Location: Right Arm, Patient Position: Sitting, Cuff Size: Normal)   Pulse 72   Temp 98 F (36.7 C) (Oral)   Ht 5' 7.5" (1.715 m)   Wt 166 lb 12.8 oz (75.7 kg)   SpO2 99%   BMI 25.74 kg/m    Physical Exam Vitals reviewed.  Constitutional:      General: She is not in acute distress.    Appearance: She is not ill-appearing.  HENT:     Head: Normocephalic.     Nose: Nose normal.  Eyes:     Conjunctiva/sclera: Conjunctivae normal.  Cardiovascular:     Rate and Rhythm: Normal rate and regular rhythm.     Heart sounds: Normal heart sounds.  Pulmonary:     Effort: Pulmonary effort is normal.     Breath sounds: Normal breath sounds.  Abdominal:     General: Abdomen is flat. Bowel sounds are normal.     Palpations: Abdomen is soft.  Musculoskeletal:        General: Normal range of motion.     Cervical back: Normal  range of motion.  Neurological:     Mental Status: She is alert and oriented to person, place, and time. Mental status is at baseline.  Psychiatric:        Mood and Affect: Mood normal.        Behavior: Behavior normal.        Thought Content: Thought content normal.        Judgment: Judgment normal.       12/31/2022    1:09 PM 12/17/2022    1:54 PM 04/21/2022   11:49 AM 12/18/2021   11:18 AM 12/13/2021   10:22 AM  Depression screen PHQ 2/9  Decreased Interest 0 0 0 0 0  Down, Depressed, Hopeless 0 0 0 1 0  PHQ - 2 Score 0 0 0 1 0  Altered sleeping 2 3 3     Tired, decreased energy 3 3 1     Change in appetite 0 0 0    Feeling bad or failure about yourself  0 0 0    Trouble concentrating 0 0 3    Moving slowly or fidgety/restless 0 0 0    Suicidal thoughts 0 0 0    PHQ-9 Score 5 6 7     Difficult doing work/chores Very difficult Not difficult at all Not difficult at all  12/31/2022    1:10 PM 05/29/2020   12:30 PM  GAD 7 : Generalized Anxiety Score  Nervous, Anxious, on Edge 0 0  Control/stop worrying 0 0  Worry too much - different things 0 0  Trouble relaxing 0 0  Restless 0 0  Easily annoyed or irritable 0 0  Afraid - awful might happen 0 0  Total GAD 7 Score 0 0  Anxiety Difficulty Not difficult at all Not difficult at all      ASSESSMENT/PLAN:  Annual physical exam Assessment & Plan: Mammogram up to date Recommend regular self breast exams Colonoscopy up to date Pneumonia 20 today Dexa ordered today PHQ 9/GAD completed      Chronic obstructive pulmonary disease, unspecified COPD type (HCC) -     Albuterol Sulfate; Take 3 mLs (2.5 mg total) by nebulization every 6 (six) hours as needed for wheezing or shortness of breath.  Dispense: 75 mL; Refill: 11  Need for vaccination -     Pneumococcal conjugate vaccine 20-valent  Postmenopausal estrogen deficiency -     DG Bone Density; Future    PDMP reviewed  Return if symptoms worsen or fail to  improve, for PCP.  Dana Allan, MD

## 2022-12-31 NOTE — Patient Instructions (Addendum)
It was a pleasure meeting you today. Thank you for allowing me to take part in your health care.  Our goals for today as we discussed include:  Referral sent for Dexa scan. Please call to schedule appointment. [facility] [address] [place], [place] [address] [phone]    You received the Pneumonia 20 vaccine today.  You do not need any further pneumonia vaccines.  Follow up as needed  If you have any questions or concerns, please do not hesitate to call the office at [phone].  I look forward to our next visit and until then take care and stay safe.  Regards,   Dana Allan, MD   [facility]

## 2023-01-05 DIAGNOSIS — Z940 Kidney transplant status: Principal | ICD-10-CM

## 2023-01-12 ENCOUNTER — Encounter: Payer: Self-pay | Admitting: Family Medicine

## 2023-01-12 DIAGNOSIS — Z940 Kidney transplant status: Principal | ICD-10-CM

## 2023-01-12 DIAGNOSIS — J449 Chronic obstructive pulmonary disease, unspecified: Secondary | ICD-10-CM | POA: Insufficient documentation

## 2023-01-12 DIAGNOSIS — Z0000 Encounter for general adult medical examination without abnormal findings: Secondary | ICD-10-CM | POA: Insufficient documentation

## 2023-01-12 DIAGNOSIS — Z23 Encounter for immunization: Secondary | ICD-10-CM | POA: Insufficient documentation

## 2023-01-12 DIAGNOSIS — Z780 Asymptomatic menopausal state: Secondary | ICD-10-CM | POA: Insufficient documentation

## 2023-01-12 NOTE — Assessment & Plan Note (Signed)
Mammogram up to date Recommend regular self breast exams Colonoscopy up to date Pneumonia 20 today Dexa ordered today PHQ 9/GAD completed

## 2023-01-19 DIAGNOSIS — Z940 Kidney transplant status: Principal | ICD-10-CM

## 2023-01-20 DIAGNOSIS — Z940 Kidney transplant status: Secondary | ICD-10-CM | POA: Diagnosis not present

## 2023-01-22 ENCOUNTER — Ambulatory Visit (INDEPENDENT_AMBULATORY_CARE_PROVIDER_SITE_OTHER): Payer: Medicare HMO | Admitting: Podiatry

## 2023-01-22 DIAGNOSIS — Z91199 Patient's noncompliance with other medical treatment and regimen due to unspecified reason: Secondary | ICD-10-CM

## 2023-01-22 MED FILL — SODIUM BICARBONATE 650 MG TABLET: ORAL | 90 days supply | Qty: 180 | Fill #7

## 2023-01-22 MED FILL — ESOMEPRAZOLE MAGNESIUM 20 MG CAPSULE,DELAYED RELEASE: ORAL | 90 days supply | Qty: 90 | Fill #3

## 2023-01-22 MED FILL — AMLODIPINE 10 MG TABLET: ORAL | 90 days supply | Qty: 90 | Fill #2

## 2023-01-22 MED FILL — FUROSEMIDE 40 MG TABLET: ORAL | 90 days supply | Qty: 180 | Fill #3

## 2023-01-22 MED FILL — ATORVASTATIN 20 MG TABLET: ORAL | 90 days supply | Qty: 90 | Fill #2

## 2023-01-22 NOTE — Addendum Note (Signed)
Addended by: Freddie Breech on: 01/22/2023 11:35 AM   Modules accepted: Level of Service

## 2023-01-22 NOTE — Progress Notes (Signed)
1. No-show for appointment     

## 2023-01-23 MED FILL — PREGABALIN 75 MG CAPSULE: ORAL | 30 days supply | Qty: 90 | Fill #3

## 2023-01-26 DIAGNOSIS — N186 End stage renal disease: Principal | ICD-10-CM

## 2023-01-26 DIAGNOSIS — Z940 Kidney transplant status: Principal | ICD-10-CM

## 2023-02-02 DIAGNOSIS — Z940 Kidney transplant status: Principal | ICD-10-CM

## 2023-02-09 DIAGNOSIS — Z940 Kidney transplant status: Principal | ICD-10-CM

## 2023-02-11 DIAGNOSIS — Z940 Kidney transplant status: Principal | ICD-10-CM

## 2023-02-11 DIAGNOSIS — R7989 Other specified abnormal findings of blood chemistry: Principal | ICD-10-CM

## 2023-02-11 DIAGNOSIS — N186 End stage renal disease: Principal | ICD-10-CM

## 2023-02-12 ENCOUNTER — Ambulatory Visit: Admit: 2023-02-12 | Payer: MEDICARE

## 2023-02-12 ENCOUNTER — Encounter: Admit: 2023-02-12 | Payer: MEDICARE | Attending: Student in an Organized Health Care Education/Training Program

## 2023-02-12 ENCOUNTER — Ambulatory Visit: Admit: 2023-02-12 | Discharge: 2023-03-20 | Payer: MEDICARE

## 2023-02-12 ENCOUNTER — Ambulatory Visit: Admit: 2023-02-12 | Discharge: 2023-02-13 | Payer: MEDICARE | Attending: Nephrology | Primary: Nephrology

## 2023-02-12 ENCOUNTER — Encounter: Admit: 2023-02-12 | Payer: MEDICARE | Attending: Internal Medicine

## 2023-02-12 ENCOUNTER — Encounter: Admit: 2023-02-12 | Payer: MEDICARE

## 2023-02-12 ENCOUNTER — Ambulatory Visit: Admit: 2023-02-12 | Discharge: 2023-02-13 | Payer: MEDICARE

## 2023-02-12 ENCOUNTER — Encounter: Admit: 2023-02-12 | Discharge: 2023-02-13 | Payer: MEDICARE | Attending: Nephrology | Primary: Nephrology

## 2023-02-12 ENCOUNTER — Ambulatory Visit
Admit: 2023-02-12 | Discharge: 2023-03-20 | Disposition: A | Discharge status: Hospice | Payer: MEDICARE | Admitting: Nephrology

## 2023-02-12 ENCOUNTER — Encounter
Admit: 2023-02-12 | Discharge: 2023-03-20 | Disposition: A | Discharge status: Hospice | Payer: MEDICARE | Attending: Nephrology | Admitting: Nephrology

## 2023-02-12 ENCOUNTER — Encounter
Admit: 2023-02-12 | Discharge: 2023-03-20 | Payer: MEDICARE | Attending: Student in an Organized Health Care Education/Training Program

## 2023-02-12 ENCOUNTER — Encounter: Admit: 2023-02-12 | Payer: MEDICARE | Attending: Nephrology

## 2023-02-12 DIAGNOSIS — N186 End stage renal disease: Principal | ICD-10-CM

## 2023-02-12 DIAGNOSIS — Z940 Kidney transplant status: Principal | ICD-10-CM

## 2023-02-12 DIAGNOSIS — R7989 Other specified abnormal findings of blood chemistry: Principal | ICD-10-CM

## 2023-02-12 DIAGNOSIS — D849 Immunodeficiency, unspecified: Principal | ICD-10-CM

## 2023-02-12 DIAGNOSIS — L089 Local infection of the skin and subcutaneous tissue, unspecified: Principal | ICD-10-CM

## 2023-02-12 DIAGNOSIS — I10 Essential (primary) hypertension: Principal | ICD-10-CM

## 2023-02-12 DIAGNOSIS — I739 Peripheral vascular disease, unspecified: Principal | ICD-10-CM

## 2023-02-12 DIAGNOSIS — G9341 Metabolic encephalopathy: Secondary | ICD-10-CM | POA: Diagnosis not present

## 2023-02-12 DIAGNOSIS — I083 Combined rheumatic disorders of mitral, aortic and tricuspid valves: Secondary | ICD-10-CM | POA: Diagnosis not present

## 2023-02-12 DIAGNOSIS — K921 Melena: Secondary | ICD-10-CM | POA: Diagnosis not present

## 2023-02-12 DIAGNOSIS — D631 Anemia in chronic kidney disease: Secondary | ICD-10-CM | POA: Diagnosis not present

## 2023-02-12 DIAGNOSIS — J9811 Atelectasis: Secondary | ICD-10-CM | POA: Diagnosis not present

## 2023-02-12 DIAGNOSIS — I70201 Unspecified atherosclerosis of native arteries of extremities, right leg: Secondary | ICD-10-CM | POA: Diagnosis not present

## 2023-02-12 DIAGNOSIS — M6281 Muscle weakness (generalized): Secondary | ICD-10-CM | POA: Diagnosis not present

## 2023-02-12 DIAGNOSIS — I132 Hypertensive heart and chronic kidney disease with heart failure and with stage 5 chronic kidney disease, or end stage renal disease: Secondary | ICD-10-CM | POA: Diagnosis not present

## 2023-02-12 DIAGNOSIS — I5033 Acute on chronic diastolic (congestive) heart failure: Secondary | ICD-10-CM | POA: Diagnosis not present

## 2023-02-12 DIAGNOSIS — K8689 Other specified diseases of pancreas: Secondary | ICD-10-CM | POA: Diagnosis not present

## 2023-02-12 DIAGNOSIS — I5030 Unspecified diastolic (congestive) heart failure: Secondary | ICD-10-CM | POA: Diagnosis not present

## 2023-02-12 DIAGNOSIS — M7989 Other specified soft tissue disorders: Secondary | ICD-10-CM | POA: Diagnosis not present

## 2023-02-12 DIAGNOSIS — J90 Pleural effusion, not elsewhere classified: Secondary | ICD-10-CM | POA: Diagnosis not present

## 2023-02-12 DIAGNOSIS — I2510 Atherosclerotic heart disease of native coronary artery without angina pectoris: Secondary | ICD-10-CM | POA: Diagnosis not present

## 2023-02-12 DIAGNOSIS — R918 Other nonspecific abnormal finding of lung field: Secondary | ICD-10-CM | POA: Diagnosis not present

## 2023-02-12 DIAGNOSIS — N170 Acute kidney failure with tubular necrosis: Secondary | ICD-10-CM | POA: Diagnosis not present

## 2023-02-12 DIAGNOSIS — M86171 Other acute osteomyelitis, right ankle and foot: Secondary | ICD-10-CM | POA: Diagnosis not present

## 2023-02-12 DIAGNOSIS — I70221 Atherosclerosis of native arteries of extremities with rest pain, right leg: Secondary | ICD-10-CM | POA: Diagnosis not present

## 2023-02-12 DIAGNOSIS — R10819 Abdominal tenderness, unspecified site: Secondary | ICD-10-CM | POA: Diagnosis not present

## 2023-02-12 DIAGNOSIS — R0600 Dyspnea, unspecified: Secondary | ICD-10-CM | POA: Diagnosis not present

## 2023-02-12 DIAGNOSIS — I5023 Acute on chronic systolic (congestive) heart failure: Secondary | ICD-10-CM | POA: Diagnosis not present

## 2023-02-12 DIAGNOSIS — R0902 Hypoxemia: Secondary | ICD-10-CM | POA: Diagnosis not present

## 2023-02-12 DIAGNOSIS — Z95820 Peripheral vascular angioplasty status with implants and grafts: Secondary | ICD-10-CM | POA: Diagnosis not present

## 2023-02-12 DIAGNOSIS — F17210 Nicotine dependence, cigarettes, uncomplicated: Secondary | ICD-10-CM | POA: Diagnosis not present

## 2023-02-12 DIAGNOSIS — J4489 Other specified chronic obstructive pulmonary disease: Secondary | ICD-10-CM | POA: Diagnosis not present

## 2023-02-12 DIAGNOSIS — I771 Stricture of artery: Secondary | ICD-10-CM | POA: Diagnosis not present

## 2023-02-12 DIAGNOSIS — R4182 Altered mental status, unspecified: Secondary | ICD-10-CM | POA: Diagnosis not present

## 2023-02-12 DIAGNOSIS — M79604 Pain in right leg: Secondary | ICD-10-CM | POA: Diagnosis not present

## 2023-02-12 DIAGNOSIS — Z515 Encounter for palliative care: Secondary | ICD-10-CM | POA: Diagnosis not present

## 2023-02-12 DIAGNOSIS — G9340 Encephalopathy, unspecified: Secondary | ICD-10-CM | POA: Diagnosis not present

## 2023-02-12 DIAGNOSIS — D62 Acute posthemorrhagic anemia: Secondary | ICD-10-CM | POA: Diagnosis not present

## 2023-02-12 DIAGNOSIS — I110 Hypertensive heart disease with heart failure: Secondary | ICD-10-CM | POA: Diagnosis not present

## 2023-02-12 DIAGNOSIS — E1122 Type 2 diabetes mellitus with diabetic chronic kidney disease: Secondary | ICD-10-CM | POA: Diagnosis not present

## 2023-02-12 DIAGNOSIS — N261 Atrophy of kidney (terminal): Secondary | ICD-10-CM | POA: Diagnosis not present

## 2023-02-12 DIAGNOSIS — L0889 Other specified local infections of the skin and subcutaneous tissue: Secondary | ICD-10-CM | POA: Diagnosis not present

## 2023-02-12 DIAGNOSIS — I70263 Atherosclerosis of native arteries of extremities with gangrene, bilateral legs: Secondary | ICD-10-CM | POA: Diagnosis not present

## 2023-02-12 DIAGNOSIS — Z79621 Long term (current) use of calcineurin inhibitor: Secondary | ICD-10-CM | POA: Diagnosis not present

## 2023-02-12 DIAGNOSIS — R238 Other skin changes: Secondary | ICD-10-CM | POA: Diagnosis not present

## 2023-02-12 DIAGNOSIS — I672 Cerebral atherosclerosis: Secondary | ICD-10-CM | POA: Diagnosis not present

## 2023-02-12 DIAGNOSIS — I7092 Chronic total occlusion of artery of the extremities: Secondary | ICD-10-CM | POA: Diagnosis not present

## 2023-02-12 DIAGNOSIS — G629 Polyneuropathy, unspecified: Secondary | ICD-10-CM | POA: Diagnosis not present

## 2023-02-12 DIAGNOSIS — I088 Other rheumatic multiple valve diseases: Secondary | ICD-10-CM | POA: Diagnosis not present

## 2023-02-12 DIAGNOSIS — I70235 Atherosclerosis of native arteries of right leg with ulceration of other part of foot: Secondary | ICD-10-CM | POA: Diagnosis not present

## 2023-02-12 DIAGNOSIS — E1152 Type 2 diabetes mellitus with diabetic peripheral angiopathy with gangrene: Secondary | ICD-10-CM | POA: Diagnosis not present

## 2023-02-12 DIAGNOSIS — R578 Other shock: Secondary | ICD-10-CM | POA: Diagnosis not present

## 2023-02-12 DIAGNOSIS — J810 Acute pulmonary edema: Secondary | ICD-10-CM | POA: Diagnosis not present

## 2023-02-12 DIAGNOSIS — E1151 Type 2 diabetes mellitus with diabetic peripheral angiopathy without gangrene: Secondary | ICD-10-CM | POA: Diagnosis not present

## 2023-02-12 DIAGNOSIS — N390 Urinary tract infection, site not specified: Secondary | ICD-10-CM | POA: Diagnosis not present

## 2023-02-12 DIAGNOSIS — E119 Type 2 diabetes mellitus without complications: Secondary | ICD-10-CM | POA: Diagnosis not present

## 2023-02-12 DIAGNOSIS — E1140 Type 2 diabetes mellitus with diabetic neuropathy, unspecified: Secondary | ICD-10-CM | POA: Diagnosis not present

## 2023-02-12 DIAGNOSIS — M8629 Subacute osteomyelitis, multiple sites: Secondary | ICD-10-CM | POA: Diagnosis not present

## 2023-02-12 DIAGNOSIS — K551 Chronic vascular disorders of intestine: Secondary | ICD-10-CM | POA: Diagnosis not present

## 2023-02-12 DIAGNOSIS — R638 Other symptoms and signs concerning food and fluid intake: Secondary | ICD-10-CM | POA: Diagnosis not present

## 2023-02-12 DIAGNOSIS — N179 Acute kidney failure, unspecified: Secondary | ICD-10-CM | POA: Diagnosis not present

## 2023-02-12 DIAGNOSIS — M869 Osteomyelitis, unspecified: Secondary | ICD-10-CM | POA: Diagnosis not present

## 2023-02-12 DIAGNOSIS — Z4682 Encounter for fitting and adjustment of non-vascular catheter: Secondary | ICD-10-CM | POA: Diagnosis not present

## 2023-02-12 DIAGNOSIS — R279 Unspecified lack of coordination: Secondary | ICD-10-CM | POA: Diagnosis not present

## 2023-02-12 DIAGNOSIS — N12 Tubulo-interstitial nephritis, not specified as acute or chronic: Secondary | ICD-10-CM | POA: Diagnosis not present

## 2023-02-12 DIAGNOSIS — Z9911 Dependence on respirator [ventilator] status: Secondary | ICD-10-CM | POA: Diagnosis not present

## 2023-02-12 DIAGNOSIS — I70203 Unspecified atherosclerosis of native arteries of extremities, bilateral legs: Secondary | ICD-10-CM | POA: Diagnosis not present

## 2023-02-12 DIAGNOSIS — R52 Pain, unspecified: Secondary | ICD-10-CM | POA: Diagnosis not present

## 2023-02-12 DIAGNOSIS — I130 Hypertensive heart and chronic kidney disease with heart failure and stage 1 through stage 4 chronic kidney disease, or unspecified chronic kidney disease: Secondary | ICD-10-CM | POA: Diagnosis not present

## 2023-02-12 DIAGNOSIS — S91301A Unspecified open wound, right foot, initial encounter: Secondary | ICD-10-CM | POA: Diagnosis not present

## 2023-02-12 DIAGNOSIS — J9602 Acute respiratory failure with hypercapnia: Secondary | ICD-10-CM | POA: Diagnosis not present

## 2023-02-12 DIAGNOSIS — J9601 Acute respiratory failure with hypoxia: Secondary | ICD-10-CM | POA: Diagnosis not present

## 2023-02-12 DIAGNOSIS — M8669 Other chronic osteomyelitis, multiple sites: Secondary | ICD-10-CM | POA: Diagnosis not present

## 2023-02-12 DIAGNOSIS — K828 Other specified diseases of gallbladder: Secondary | ICD-10-CM | POA: Diagnosis not present

## 2023-02-12 DIAGNOSIS — I70261 Atherosclerosis of native arteries of extremities with gangrene, right leg: Secondary | ICD-10-CM | POA: Diagnosis not present

## 2023-02-12 DIAGNOSIS — I96 Gangrene, not elsewhere classified: Secondary | ICD-10-CM | POA: Diagnosis not present

## 2023-02-12 DIAGNOSIS — R110 Nausea: Secondary | ICD-10-CM | POA: Diagnosis not present

## 2023-02-12 DIAGNOSIS — I25118 Atherosclerotic heart disease of native coronary artery with other forms of angina pectoris: Secondary | ICD-10-CM | POA: Diagnosis not present

## 2023-02-12 DIAGNOSIS — B3749 Other urogenital candidiasis: Secondary | ICD-10-CM | POA: Diagnosis not present

## 2023-02-12 DIAGNOSIS — N189 Chronic kidney disease, unspecified: Secondary | ICD-10-CM | POA: Diagnosis not present

## 2023-02-12 DIAGNOSIS — D649 Anemia, unspecified: Secondary | ICD-10-CM | POA: Diagnosis not present

## 2023-02-12 DIAGNOSIS — J988 Other specified respiratory disorders: Secondary | ICD-10-CM | POA: Diagnosis not present

## 2023-02-12 DIAGNOSIS — Z452 Encounter for adjustment and management of vascular access device: Secondary | ICD-10-CM | POA: Diagnosis not present

## 2023-02-12 DIAGNOSIS — G319 Degenerative disease of nervous system, unspecified: Secondary | ICD-10-CM | POA: Diagnosis not present

## 2023-02-12 DIAGNOSIS — R627 Adult failure to thrive: Secondary | ICD-10-CM | POA: Diagnosis not present

## 2023-02-12 DIAGNOSIS — T8613 Kidney transplant infection: Secondary | ICD-10-CM | POA: Diagnosis not present

## 2023-02-12 DIAGNOSIS — D84821 Immunodeficiency due to drugs: Secondary | ICD-10-CM | POA: Diagnosis not present

## 2023-02-12 DIAGNOSIS — I5032 Chronic diastolic (congestive) heart failure: Secondary | ICD-10-CM | POA: Diagnosis not present

## 2023-02-12 DIAGNOSIS — S90424A Blister (nonthermal), right lesser toe(s), initial encounter: Secondary | ICD-10-CM | POA: Diagnosis not present

## 2023-02-12 DIAGNOSIS — B3731 Acute candidiasis of vulva and vagina: Secondary | ICD-10-CM | POA: Diagnosis not present

## 2023-02-12 DIAGNOSIS — R5381 Other malaise: Secondary | ICD-10-CM | POA: Diagnosis not present

## 2023-02-12 DIAGNOSIS — J811 Chronic pulmonary edema: Secondary | ICD-10-CM | POA: Diagnosis not present

## 2023-02-12 DIAGNOSIS — Z79899 Other long term (current) drug therapy: Secondary | ICD-10-CM | POA: Diagnosis not present

## 2023-02-12 DIAGNOSIS — T8619 Other complication of kidney transplant: Secondary | ICD-10-CM | POA: Diagnosis not present

## 2023-02-12 DIAGNOSIS — M79671 Pain in right foot: Secondary | ICD-10-CM | POA: Diagnosis not present

## 2023-02-12 DIAGNOSIS — I959 Hypotension, unspecified: Secondary | ICD-10-CM | POA: Diagnosis not present

## 2023-02-12 DIAGNOSIS — F05 Delirium due to known physiological condition: Secondary | ICD-10-CM | POA: Diagnosis not present

## 2023-02-13 DIAGNOSIS — D84821 Immunodeficiency due to drugs: Secondary | ICD-10-CM | POA: Diagnosis not present

## 2023-02-13 DIAGNOSIS — I771 Stricture of artery: Secondary | ICD-10-CM | POA: Diagnosis not present

## 2023-02-13 DIAGNOSIS — E1151 Type 2 diabetes mellitus with diabetic peripheral angiopathy without gangrene: Secondary | ICD-10-CM | POA: Diagnosis not present

## 2023-02-13 DIAGNOSIS — I70201 Unspecified atherosclerosis of native arteries of extremities, right leg: Secondary | ICD-10-CM | POA: Diagnosis not present

## 2023-02-13 DIAGNOSIS — Z940 Kidney transplant status: Secondary | ICD-10-CM | POA: Diagnosis not present

## 2023-02-13 DIAGNOSIS — T8619 Other complication of kidney transplant: Secondary | ICD-10-CM | POA: Diagnosis not present

## 2023-02-13 DIAGNOSIS — M79671 Pain in right foot: Secondary | ICD-10-CM | POA: Diagnosis not present

## 2023-02-13 DIAGNOSIS — Z79621 Long term (current) use of calcineurin inhibitor: Secondary | ICD-10-CM | POA: Diagnosis not present

## 2023-02-13 DIAGNOSIS — I70235 Atherosclerosis of native arteries of right leg with ulceration of other part of foot: Secondary | ICD-10-CM | POA: Diagnosis not present

## 2023-02-13 MED ORDER — ELIQUIS 5 MG TABLET
ORAL_TABLET | Freq: Two times a day (BID) | ORAL | 0 refills | 30 days
Start: 2023-02-13 — End: 2023-02-13

## 2023-02-14 DIAGNOSIS — Z79621 Long term (current) use of calcineurin inhibitor: Secondary | ICD-10-CM | POA: Diagnosis not present

## 2023-02-14 DIAGNOSIS — D84821 Immunodeficiency due to drugs: Secondary | ICD-10-CM | POA: Diagnosis not present

## 2023-02-14 DIAGNOSIS — I70261 Atherosclerosis of native arteries of extremities with gangrene, right leg: Secondary | ICD-10-CM | POA: Diagnosis not present

## 2023-02-14 DIAGNOSIS — E1152 Type 2 diabetes mellitus with diabetic peripheral angiopathy with gangrene: Secondary | ICD-10-CM | POA: Diagnosis not present

## 2023-02-14 DIAGNOSIS — Z940 Kidney transplant status: Secondary | ICD-10-CM | POA: Diagnosis not present

## 2023-02-14 DIAGNOSIS — T8619 Other complication of kidney transplant: Secondary | ICD-10-CM | POA: Diagnosis not present

## 2023-02-14 DIAGNOSIS — R10819 Abdominal tenderness, unspecified site: Secondary | ICD-10-CM | POA: Diagnosis not present

## 2023-02-15 DIAGNOSIS — R0600 Dyspnea, unspecified: Secondary | ICD-10-CM | POA: Diagnosis not present

## 2023-02-15 DIAGNOSIS — J810 Acute pulmonary edema: Secondary | ICD-10-CM | POA: Diagnosis not present

## 2023-02-15 DIAGNOSIS — L089 Local infection of the skin and subcutaneous tissue, unspecified: Secondary | ICD-10-CM | POA: Diagnosis not present

## 2023-02-15 DIAGNOSIS — E1152 Type 2 diabetes mellitus with diabetic peripheral angiopathy with gangrene: Secondary | ICD-10-CM | POA: Diagnosis not present

## 2023-02-15 DIAGNOSIS — I739 Peripheral vascular disease, unspecified: Secondary | ICD-10-CM | POA: Diagnosis not present

## 2023-02-15 DIAGNOSIS — I70261 Atherosclerosis of native arteries of extremities with gangrene, right leg: Secondary | ICD-10-CM | POA: Diagnosis not present

## 2023-02-15 DIAGNOSIS — R0902 Hypoxemia: Secondary | ICD-10-CM | POA: Diagnosis not present

## 2023-02-15 DIAGNOSIS — Z940 Kidney transplant status: Secondary | ICD-10-CM | POA: Diagnosis not present

## 2023-02-15 DIAGNOSIS — T8619 Other complication of kidney transplant: Secondary | ICD-10-CM | POA: Diagnosis not present

## 2023-02-15 DIAGNOSIS — Z79621 Long term (current) use of calcineurin inhibitor: Secondary | ICD-10-CM | POA: Diagnosis not present

## 2023-02-15 DIAGNOSIS — I110 Hypertensive heart disease with heart failure: Secondary | ICD-10-CM | POA: Diagnosis not present

## 2023-02-15 DIAGNOSIS — K828 Other specified diseases of gallbladder: Secondary | ICD-10-CM | POA: Diagnosis not present

## 2023-02-15 DIAGNOSIS — I5032 Chronic diastolic (congestive) heart failure: Secondary | ICD-10-CM | POA: Diagnosis not present

## 2023-02-15 DIAGNOSIS — J811 Chronic pulmonary edema: Secondary | ICD-10-CM | POA: Diagnosis not present

## 2023-02-15 DIAGNOSIS — I2510 Atherosclerotic heart disease of native coronary artery without angina pectoris: Secondary | ICD-10-CM | POA: Diagnosis not present

## 2023-02-15 DIAGNOSIS — I10 Essential (primary) hypertension: Secondary | ICD-10-CM | POA: Diagnosis not present

## 2023-02-15 DIAGNOSIS — D84821 Immunodeficiency due to drugs: Secondary | ICD-10-CM | POA: Diagnosis not present

## 2023-02-16 DIAGNOSIS — Z940 Kidney transplant status: Principal | ICD-10-CM

## 2023-02-16 DIAGNOSIS — I083 Combined rheumatic disorders of mitral, aortic and tricuspid valves: Secondary | ICD-10-CM | POA: Diagnosis not present

## 2023-02-16 DIAGNOSIS — L089 Local infection of the skin and subcutaneous tissue, unspecified: Secondary | ICD-10-CM | POA: Diagnosis not present

## 2023-02-16 DIAGNOSIS — D62 Acute posthemorrhagic anemia: Secondary | ICD-10-CM | POA: Diagnosis not present

## 2023-02-16 DIAGNOSIS — I10 Essential (primary) hypertension: Secondary | ICD-10-CM | POA: Diagnosis not present

## 2023-02-16 DIAGNOSIS — I5032 Chronic diastolic (congestive) heart failure: Secondary | ICD-10-CM | POA: Diagnosis not present

## 2023-02-16 DIAGNOSIS — R0600 Dyspnea, unspecified: Secondary | ICD-10-CM | POA: Diagnosis not present

## 2023-02-16 DIAGNOSIS — I7092 Chronic total occlusion of artery of the extremities: Secondary | ICD-10-CM | POA: Diagnosis not present

## 2023-02-16 DIAGNOSIS — E119 Type 2 diabetes mellitus without complications: Secondary | ICD-10-CM | POA: Diagnosis not present

## 2023-02-16 DIAGNOSIS — J810 Acute pulmonary edema: Secondary | ICD-10-CM | POA: Diagnosis not present

## 2023-02-16 DIAGNOSIS — I2510 Atherosclerotic heart disease of native coronary artery without angina pectoris: Secondary | ICD-10-CM | POA: Diagnosis not present

## 2023-02-17 DIAGNOSIS — I70261 Atherosclerosis of native arteries of extremities with gangrene, right leg: Secondary | ICD-10-CM | POA: Diagnosis not present

## 2023-02-17 DIAGNOSIS — I2510 Atherosclerotic heart disease of native coronary artery without angina pectoris: Secondary | ICD-10-CM | POA: Diagnosis not present

## 2023-02-17 DIAGNOSIS — R0600 Dyspnea, unspecified: Secondary | ICD-10-CM | POA: Diagnosis not present

## 2023-02-17 DIAGNOSIS — I10 Essential (primary) hypertension: Secondary | ICD-10-CM | POA: Diagnosis not present

## 2023-02-17 DIAGNOSIS — I739 Peripheral vascular disease, unspecified: Secondary | ICD-10-CM | POA: Diagnosis not present

## 2023-02-17 DIAGNOSIS — J810 Acute pulmonary edema: Secondary | ICD-10-CM | POA: Diagnosis not present

## 2023-02-17 DIAGNOSIS — S91301A Unspecified open wound, right foot, initial encounter: Secondary | ICD-10-CM | POA: Diagnosis not present

## 2023-02-17 DIAGNOSIS — Z940 Kidney transplant status: Secondary | ICD-10-CM | POA: Diagnosis not present

## 2023-02-17 DIAGNOSIS — E1152 Type 2 diabetes mellitus with diabetic peripheral angiopathy with gangrene: Secondary | ICD-10-CM | POA: Diagnosis not present

## 2023-02-18 DIAGNOSIS — I2510 Atherosclerotic heart disease of native coronary artery without angina pectoris: Secondary | ICD-10-CM | POA: Diagnosis not present

## 2023-02-18 DIAGNOSIS — R0600 Dyspnea, unspecified: Secondary | ICD-10-CM | POA: Diagnosis not present

## 2023-02-18 DIAGNOSIS — J810 Acute pulmonary edema: Secondary | ICD-10-CM | POA: Diagnosis not present

## 2023-02-18 DIAGNOSIS — Z452 Encounter for adjustment and management of vascular access device: Secondary | ICD-10-CM | POA: Diagnosis not present

## 2023-02-18 DIAGNOSIS — I10 Essential (primary) hypertension: Secondary | ICD-10-CM | POA: Diagnosis not present

## 2023-02-18 MED ORDER — XARELTO 20 MG TABLET
ORAL_TABLET | Freq: Every day | ORAL | 0 refills | 30 days
Start: 2023-02-18 — End: 2023-02-18

## 2023-02-19 DIAGNOSIS — D84821 Immunodeficiency due to drugs: Secondary | ICD-10-CM | POA: Diagnosis not present

## 2023-02-19 DIAGNOSIS — T8619 Other complication of kidney transplant: Secondary | ICD-10-CM | POA: Diagnosis not present

## 2023-02-19 DIAGNOSIS — N179 Acute kidney failure, unspecified: Secondary | ICD-10-CM | POA: Diagnosis not present

## 2023-02-19 DIAGNOSIS — Z79621 Long term (current) use of calcineurin inhibitor: Secondary | ICD-10-CM | POA: Diagnosis not present

## 2023-02-19 DIAGNOSIS — Z940 Kidney transplant status: Secondary | ICD-10-CM | POA: Diagnosis not present

## 2023-02-19 DIAGNOSIS — N189 Chronic kidney disease, unspecified: Secondary | ICD-10-CM | POA: Diagnosis not present

## 2023-02-20 DIAGNOSIS — I5032 Chronic diastolic (congestive) heart failure: Secondary | ICD-10-CM | POA: Diagnosis not present

## 2023-02-20 DIAGNOSIS — J90 Pleural effusion, not elsewhere classified: Secondary | ICD-10-CM | POA: Diagnosis not present

## 2023-02-20 DIAGNOSIS — J9811 Atelectasis: Secondary | ICD-10-CM | POA: Diagnosis not present

## 2023-02-20 DIAGNOSIS — M869 Osteomyelitis, unspecified: Secondary | ICD-10-CM | POA: Diagnosis not present

## 2023-02-20 DIAGNOSIS — F17210 Nicotine dependence, cigarettes, uncomplicated: Secondary | ICD-10-CM | POA: Diagnosis not present

## 2023-02-20 DIAGNOSIS — J811 Chronic pulmonary edema: Secondary | ICD-10-CM | POA: Diagnosis not present

## 2023-02-20 DIAGNOSIS — D84821 Immunodeficiency due to drugs: Secondary | ICD-10-CM | POA: Diagnosis not present

## 2023-02-20 DIAGNOSIS — I739 Peripheral vascular disease, unspecified: Secondary | ICD-10-CM | POA: Diagnosis not present

## 2023-02-20 DIAGNOSIS — M7989 Other specified soft tissue disorders: Secondary | ICD-10-CM | POA: Diagnosis not present

## 2023-02-20 DIAGNOSIS — Z940 Kidney transplant status: Secondary | ICD-10-CM | POA: Diagnosis not present

## 2023-02-20 DIAGNOSIS — Z79621 Long term (current) use of calcineurin inhibitor: Secondary | ICD-10-CM | POA: Diagnosis not present

## 2023-02-20 DIAGNOSIS — I130 Hypertensive heart and chronic kidney disease with heart failure and stage 1 through stage 4 chronic kidney disease, or unspecified chronic kidney disease: Secondary | ICD-10-CM | POA: Diagnosis not present

## 2023-02-20 DIAGNOSIS — N189 Chronic kidney disease, unspecified: Secondary | ICD-10-CM | POA: Diagnosis not present

## 2023-02-20 DIAGNOSIS — D62 Acute posthemorrhagic anemia: Secondary | ICD-10-CM | POA: Diagnosis not present

## 2023-02-20 DIAGNOSIS — N179 Acute kidney failure, unspecified: Secondary | ICD-10-CM | POA: Diagnosis not present

## 2023-02-21 DIAGNOSIS — I130 Hypertensive heart and chronic kidney disease with heart failure and stage 1 through stage 4 chronic kidney disease, or unspecified chronic kidney disease: Secondary | ICD-10-CM | POA: Diagnosis not present

## 2023-02-21 DIAGNOSIS — Z79621 Long term (current) use of calcineurin inhibitor: Secondary | ICD-10-CM | POA: Diagnosis not present

## 2023-02-21 DIAGNOSIS — N189 Chronic kidney disease, unspecified: Secondary | ICD-10-CM | POA: Diagnosis not present

## 2023-02-21 DIAGNOSIS — D62 Acute posthemorrhagic anemia: Secondary | ICD-10-CM | POA: Diagnosis not present

## 2023-02-21 DIAGNOSIS — Z940 Kidney transplant status: Secondary | ICD-10-CM | POA: Diagnosis not present

## 2023-02-21 DIAGNOSIS — D84821 Immunodeficiency due to drugs: Secondary | ICD-10-CM | POA: Diagnosis not present

## 2023-02-21 DIAGNOSIS — I5032 Chronic diastolic (congestive) heart failure: Secondary | ICD-10-CM | POA: Diagnosis not present

## 2023-02-21 DIAGNOSIS — I739 Peripheral vascular disease, unspecified: Secondary | ICD-10-CM | POA: Diagnosis not present

## 2023-02-22 DIAGNOSIS — Z79621 Long term (current) use of calcineurin inhibitor: Secondary | ICD-10-CM | POA: Diagnosis not present

## 2023-02-22 DIAGNOSIS — E1152 Type 2 diabetes mellitus with diabetic peripheral angiopathy with gangrene: Secondary | ICD-10-CM | POA: Diagnosis not present

## 2023-02-22 DIAGNOSIS — D84821 Immunodeficiency due to drugs: Secondary | ICD-10-CM | POA: Diagnosis not present

## 2023-02-22 DIAGNOSIS — I739 Peripheral vascular disease, unspecified: Secondary | ICD-10-CM | POA: Diagnosis not present

## 2023-02-22 DIAGNOSIS — I771 Stricture of artery: Secondary | ICD-10-CM | POA: Diagnosis not present

## 2023-02-22 DIAGNOSIS — D62 Acute posthemorrhagic anemia: Secondary | ICD-10-CM | POA: Diagnosis not present

## 2023-02-22 DIAGNOSIS — N390 Urinary tract infection, site not specified: Secondary | ICD-10-CM | POA: Diagnosis not present

## 2023-02-22 DIAGNOSIS — N179 Acute kidney failure, unspecified: Secondary | ICD-10-CM | POA: Diagnosis not present

## 2023-02-22 DIAGNOSIS — N189 Chronic kidney disease, unspecified: Secondary | ICD-10-CM | POA: Diagnosis not present

## 2023-02-22 DIAGNOSIS — I5032 Chronic diastolic (congestive) heart failure: Secondary | ICD-10-CM | POA: Diagnosis not present

## 2023-02-22 DIAGNOSIS — Z940 Kidney transplant status: Secondary | ICD-10-CM | POA: Diagnosis not present

## 2023-02-22 DIAGNOSIS — I130 Hypertensive heart and chronic kidney disease with heart failure and stage 1 through stage 4 chronic kidney disease, or unspecified chronic kidney disease: Secondary | ICD-10-CM | POA: Diagnosis not present

## 2023-02-22 DIAGNOSIS — I70203 Unspecified atherosclerosis of native arteries of extremities, bilateral legs: Secondary | ICD-10-CM | POA: Diagnosis not present

## 2023-02-23 DIAGNOSIS — N186 End stage renal disease: Principal | ICD-10-CM

## 2023-02-23 DIAGNOSIS — Z940 Kidney transplant status: Principal | ICD-10-CM

## 2023-02-23 DIAGNOSIS — E1152 Type 2 diabetes mellitus with diabetic peripheral angiopathy with gangrene: Secondary | ICD-10-CM | POA: Diagnosis not present

## 2023-02-23 DIAGNOSIS — I5032 Chronic diastolic (congestive) heart failure: Secondary | ICD-10-CM | POA: Diagnosis not present

## 2023-02-23 DIAGNOSIS — N189 Chronic kidney disease, unspecified: Secondary | ICD-10-CM | POA: Diagnosis not present

## 2023-02-23 DIAGNOSIS — Z79621 Long term (current) use of calcineurin inhibitor: Secondary | ICD-10-CM | POA: Diagnosis not present

## 2023-02-23 DIAGNOSIS — I96 Gangrene, not elsewhere classified: Secondary | ICD-10-CM | POA: Diagnosis not present

## 2023-02-23 DIAGNOSIS — M869 Osteomyelitis, unspecified: Secondary | ICD-10-CM | POA: Diagnosis not present

## 2023-02-23 DIAGNOSIS — E119 Type 2 diabetes mellitus without complications: Secondary | ICD-10-CM | POA: Diagnosis not present

## 2023-02-23 DIAGNOSIS — N12 Tubulo-interstitial nephritis, not specified as acute or chronic: Secondary | ICD-10-CM | POA: Diagnosis not present

## 2023-02-23 DIAGNOSIS — I10 Essential (primary) hypertension: Secondary | ICD-10-CM | POA: Diagnosis not present

## 2023-02-23 DIAGNOSIS — L0889 Other specified local infections of the skin and subcutaneous tissue: Secondary | ICD-10-CM | POA: Diagnosis not present

## 2023-02-23 DIAGNOSIS — J810 Acute pulmonary edema: Secondary | ICD-10-CM | POA: Diagnosis not present

## 2023-02-23 DIAGNOSIS — I739 Peripheral vascular disease, unspecified: Secondary | ICD-10-CM | POA: Diagnosis not present

## 2023-02-23 DIAGNOSIS — I130 Hypertensive heart and chronic kidney disease with heart failure and stage 1 through stage 4 chronic kidney disease, or unspecified chronic kidney disease: Secondary | ICD-10-CM | POA: Diagnosis not present

## 2023-02-23 DIAGNOSIS — N179 Acute kidney failure, unspecified: Secondary | ICD-10-CM | POA: Diagnosis not present

## 2023-02-23 DIAGNOSIS — I771 Stricture of artery: Secondary | ICD-10-CM | POA: Diagnosis not present

## 2023-02-23 DIAGNOSIS — J4489 Other specified chronic obstructive pulmonary disease: Secondary | ICD-10-CM | POA: Diagnosis not present

## 2023-02-23 DIAGNOSIS — M86171 Other acute osteomyelitis, right ankle and foot: Secondary | ICD-10-CM | POA: Diagnosis not present

## 2023-02-23 DIAGNOSIS — D62 Acute posthemorrhagic anemia: Secondary | ICD-10-CM | POA: Diagnosis not present

## 2023-02-23 DIAGNOSIS — D84821 Immunodeficiency due to drugs: Secondary | ICD-10-CM | POA: Diagnosis not present

## 2023-02-24 DIAGNOSIS — M86171 Other acute osteomyelitis, right ankle and foot: Secondary | ICD-10-CM | POA: Diagnosis not present

## 2023-02-24 DIAGNOSIS — B3731 Acute candidiasis of vulva and vagina: Secondary | ICD-10-CM | POA: Diagnosis not present

## 2023-02-24 DIAGNOSIS — J811 Chronic pulmonary edema: Secondary | ICD-10-CM | POA: Diagnosis not present

## 2023-02-24 DIAGNOSIS — Z79621 Long term (current) use of calcineurin inhibitor: Secondary | ICD-10-CM | POA: Diagnosis not present

## 2023-02-24 DIAGNOSIS — N189 Chronic kidney disease, unspecified: Secondary | ICD-10-CM | POA: Diagnosis not present

## 2023-02-24 DIAGNOSIS — D84821 Immunodeficiency due to drugs: Secondary | ICD-10-CM | POA: Diagnosis not present

## 2023-02-24 DIAGNOSIS — Z940 Kidney transplant status: Secondary | ICD-10-CM | POA: Diagnosis not present

## 2023-02-24 DIAGNOSIS — N179 Acute kidney failure, unspecified: Secondary | ICD-10-CM | POA: Diagnosis not present

## 2023-02-24 DIAGNOSIS — I96 Gangrene, not elsewhere classified: Secondary | ICD-10-CM | POA: Diagnosis not present

## 2023-02-24 DIAGNOSIS — T8619 Other complication of kidney transplant: Secondary | ICD-10-CM | POA: Diagnosis not present

## 2023-02-24 DIAGNOSIS — J90 Pleural effusion, not elsewhere classified: Secondary | ICD-10-CM | POA: Diagnosis not present

## 2023-02-24 DIAGNOSIS — R918 Other nonspecific abnormal finding of lung field: Secondary | ICD-10-CM | POA: Diagnosis not present

## 2023-02-25 DIAGNOSIS — N189 Chronic kidney disease, unspecified: Secondary | ICD-10-CM | POA: Diagnosis not present

## 2023-02-25 DIAGNOSIS — R0902 Hypoxemia: Secondary | ICD-10-CM | POA: Diagnosis not present

## 2023-02-25 DIAGNOSIS — N179 Acute kidney failure, unspecified: Secondary | ICD-10-CM | POA: Diagnosis not present

## 2023-02-25 DIAGNOSIS — N12 Tubulo-interstitial nephritis, not specified as acute or chronic: Secondary | ICD-10-CM | POA: Diagnosis not present

## 2023-02-25 DIAGNOSIS — J90 Pleural effusion, not elsewhere classified: Secondary | ICD-10-CM | POA: Diagnosis not present

## 2023-02-25 DIAGNOSIS — D84821 Immunodeficiency due to drugs: Secondary | ICD-10-CM | POA: Diagnosis not present

## 2023-02-25 DIAGNOSIS — T8619 Other complication of kidney transplant: Secondary | ICD-10-CM | POA: Diagnosis not present

## 2023-02-25 DIAGNOSIS — J811 Chronic pulmonary edema: Secondary | ICD-10-CM | POA: Diagnosis not present

## 2023-02-25 DIAGNOSIS — I739 Peripheral vascular disease, unspecified: Secondary | ICD-10-CM | POA: Diagnosis not present

## 2023-02-25 DIAGNOSIS — M86171 Other acute osteomyelitis, right ankle and foot: Secondary | ICD-10-CM | POA: Diagnosis not present

## 2023-02-25 DIAGNOSIS — I96 Gangrene, not elsewhere classified: Secondary | ICD-10-CM | POA: Diagnosis not present

## 2023-02-25 DIAGNOSIS — Z940 Kidney transplant status: Secondary | ICD-10-CM | POA: Diagnosis not present

## 2023-02-25 DIAGNOSIS — Z79621 Long term (current) use of calcineurin inhibitor: Secondary | ICD-10-CM | POA: Diagnosis not present

## 2023-02-26 DIAGNOSIS — N179 Acute kidney failure, unspecified: Secondary | ICD-10-CM | POA: Diagnosis not present

## 2023-02-26 DIAGNOSIS — I70221 Atherosclerosis of native arteries of extremities with rest pain, right leg: Secondary | ICD-10-CM | POA: Diagnosis not present

## 2023-02-26 DIAGNOSIS — I130 Hypertensive heart and chronic kidney disease with heart failure and stage 1 through stage 4 chronic kidney disease, or unspecified chronic kidney disease: Secondary | ICD-10-CM | POA: Diagnosis not present

## 2023-02-26 DIAGNOSIS — Z940 Kidney transplant status: Secondary | ICD-10-CM | POA: Diagnosis not present

## 2023-02-26 DIAGNOSIS — I5032 Chronic diastolic (congestive) heart failure: Secondary | ICD-10-CM | POA: Diagnosis not present

## 2023-02-26 DIAGNOSIS — N189 Chronic kidney disease, unspecified: Secondary | ICD-10-CM | POA: Diagnosis not present

## 2023-02-27 DIAGNOSIS — N189 Chronic kidney disease, unspecified: Secondary | ICD-10-CM | POA: Diagnosis not present

## 2023-02-27 DIAGNOSIS — Z79621 Long term (current) use of calcineurin inhibitor: Secondary | ICD-10-CM | POA: Diagnosis not present

## 2023-02-27 DIAGNOSIS — Z95820 Peripheral vascular angioplasty status with implants and grafts: Secondary | ICD-10-CM | POA: Diagnosis not present

## 2023-02-27 DIAGNOSIS — I96 Gangrene, not elsewhere classified: Secondary | ICD-10-CM | POA: Diagnosis not present

## 2023-02-27 DIAGNOSIS — D631 Anemia in chronic kidney disease: Secondary | ICD-10-CM | POA: Diagnosis not present

## 2023-02-27 DIAGNOSIS — N179 Acute kidney failure, unspecified: Secondary | ICD-10-CM | POA: Diagnosis not present

## 2023-02-27 DIAGNOSIS — E1140 Type 2 diabetes mellitus with diabetic neuropathy, unspecified: Secondary | ICD-10-CM | POA: Diagnosis not present

## 2023-02-27 DIAGNOSIS — N12 Tubulo-interstitial nephritis, not specified as acute or chronic: Secondary | ICD-10-CM | POA: Diagnosis not present

## 2023-02-27 DIAGNOSIS — T8619 Other complication of kidney transplant: Secondary | ICD-10-CM | POA: Diagnosis not present

## 2023-02-27 DIAGNOSIS — D84821 Immunodeficiency due to drugs: Secondary | ICD-10-CM | POA: Diagnosis not present

## 2023-02-27 DIAGNOSIS — E1152 Type 2 diabetes mellitus with diabetic peripheral angiopathy with gangrene: Secondary | ICD-10-CM | POA: Diagnosis not present

## 2023-02-27 DIAGNOSIS — M86171 Other acute osteomyelitis, right ankle and foot: Secondary | ICD-10-CM | POA: Diagnosis not present

## 2023-02-27 DIAGNOSIS — Z940 Kidney transplant status: Secondary | ICD-10-CM | POA: Diagnosis not present

## 2023-02-27 DIAGNOSIS — I739 Peripheral vascular disease, unspecified: Secondary | ICD-10-CM | POA: Diagnosis not present

## 2023-02-28 DIAGNOSIS — J9601 Acute respiratory failure with hypoxia: Secondary | ICD-10-CM | POA: Diagnosis not present

## 2023-02-28 DIAGNOSIS — I96 Gangrene, not elsewhere classified: Secondary | ICD-10-CM | POA: Diagnosis not present

## 2023-02-28 DIAGNOSIS — N179 Acute kidney failure, unspecified: Secondary | ICD-10-CM | POA: Diagnosis not present

## 2023-02-28 DIAGNOSIS — K828 Other specified diseases of gallbladder: Secondary | ICD-10-CM | POA: Diagnosis not present

## 2023-02-28 DIAGNOSIS — I739 Peripheral vascular disease, unspecified: Secondary | ICD-10-CM | POA: Diagnosis not present

## 2023-02-28 DIAGNOSIS — I672 Cerebral atherosclerosis: Secondary | ICD-10-CM | POA: Diagnosis not present

## 2023-02-28 DIAGNOSIS — R918 Other nonspecific abnormal finding of lung field: Secondary | ICD-10-CM | POA: Diagnosis not present

## 2023-02-28 DIAGNOSIS — M86171 Other acute osteomyelitis, right ankle and foot: Secondary | ICD-10-CM | POA: Diagnosis not present

## 2023-02-28 DIAGNOSIS — D649 Anemia, unspecified: Secondary | ICD-10-CM | POA: Diagnosis not present

## 2023-02-28 DIAGNOSIS — I10 Essential (primary) hypertension: Secondary | ICD-10-CM | POA: Diagnosis not present

## 2023-02-28 DIAGNOSIS — Z940 Kidney transplant status: Secondary | ICD-10-CM | POA: Diagnosis not present

## 2023-02-28 DIAGNOSIS — R4182 Altered mental status, unspecified: Secondary | ICD-10-CM | POA: Diagnosis not present

## 2023-02-28 DIAGNOSIS — G9340 Encephalopathy, unspecified: Secondary | ICD-10-CM | POA: Diagnosis not present

## 2023-02-28 DIAGNOSIS — N189 Chronic kidney disease, unspecified: Secondary | ICD-10-CM | POA: Diagnosis not present

## 2023-02-28 DIAGNOSIS — G319 Degenerative disease of nervous system, unspecified: Secondary | ICD-10-CM | POA: Diagnosis not present

## 2023-03-01 DIAGNOSIS — M86171 Other acute osteomyelitis, right ankle and foot: Secondary | ICD-10-CM | POA: Diagnosis not present

## 2023-03-01 DIAGNOSIS — Z940 Kidney transplant status: Secondary | ICD-10-CM | POA: Diagnosis not present

## 2023-03-01 DIAGNOSIS — R4182 Altered mental status, unspecified: Secondary | ICD-10-CM | POA: Diagnosis not present

## 2023-03-01 DIAGNOSIS — N189 Chronic kidney disease, unspecified: Secondary | ICD-10-CM | POA: Diagnosis not present

## 2023-03-01 DIAGNOSIS — I96 Gangrene, not elsewhere classified: Secondary | ICD-10-CM | POA: Diagnosis not present

## 2023-03-01 DIAGNOSIS — I130 Hypertensive heart and chronic kidney disease with heart failure and stage 1 through stage 4 chronic kidney disease, or unspecified chronic kidney disease: Secondary | ICD-10-CM | POA: Diagnosis not present

## 2023-03-01 DIAGNOSIS — J9601 Acute respiratory failure with hypoxia: Secondary | ICD-10-CM | POA: Diagnosis not present

## 2023-03-01 DIAGNOSIS — I5032 Chronic diastolic (congestive) heart failure: Secondary | ICD-10-CM | POA: Diagnosis not present

## 2023-03-01 DIAGNOSIS — G9340 Encephalopathy, unspecified: Secondary | ICD-10-CM | POA: Diagnosis not present

## 2023-03-01 DIAGNOSIS — I739 Peripheral vascular disease, unspecified: Secondary | ICD-10-CM | POA: Diagnosis not present

## 2023-03-01 DIAGNOSIS — N179 Acute kidney failure, unspecified: Secondary | ICD-10-CM | POA: Diagnosis not present

## 2023-03-02 DIAGNOSIS — Z940 Kidney transplant status: Principal | ICD-10-CM

## 2023-03-02 DIAGNOSIS — N189 Chronic kidney disease, unspecified: Secondary | ICD-10-CM | POA: Diagnosis not present

## 2023-03-02 DIAGNOSIS — I130 Hypertensive heart and chronic kidney disease with heart failure and stage 1 through stage 4 chronic kidney disease, or unspecified chronic kidney disease: Secondary | ICD-10-CM | POA: Diagnosis not present

## 2023-03-02 DIAGNOSIS — M86171 Other acute osteomyelitis, right ankle and foot: Secondary | ICD-10-CM | POA: Diagnosis not present

## 2023-03-02 DIAGNOSIS — E1152 Type 2 diabetes mellitus with diabetic peripheral angiopathy with gangrene: Secondary | ICD-10-CM | POA: Diagnosis not present

## 2023-03-02 DIAGNOSIS — D649 Anemia, unspecified: Secondary | ICD-10-CM | POA: Diagnosis not present

## 2023-03-02 DIAGNOSIS — S90424A Blister (nonthermal), right lesser toe(s), initial encounter: Secondary | ICD-10-CM | POA: Diagnosis not present

## 2023-03-02 DIAGNOSIS — G9340 Encephalopathy, unspecified: Secondary | ICD-10-CM | POA: Diagnosis not present

## 2023-03-02 DIAGNOSIS — I739 Peripheral vascular disease, unspecified: Secondary | ICD-10-CM | POA: Diagnosis not present

## 2023-03-02 DIAGNOSIS — I5032 Chronic diastolic (congestive) heart failure: Secondary | ICD-10-CM | POA: Diagnosis not present

## 2023-03-02 DIAGNOSIS — M869 Osteomyelitis, unspecified: Secondary | ICD-10-CM | POA: Diagnosis not present

## 2023-03-02 DIAGNOSIS — R4182 Altered mental status, unspecified: Secondary | ICD-10-CM | POA: Diagnosis not present

## 2023-03-03 DIAGNOSIS — Z4682 Encounter for fitting and adjustment of non-vascular catheter: Secondary | ICD-10-CM | POA: Diagnosis not present

## 2023-03-03 DIAGNOSIS — J90 Pleural effusion, not elsewhere classified: Secondary | ICD-10-CM | POA: Diagnosis not present

## 2023-03-03 DIAGNOSIS — Z79621 Long term (current) use of calcineurin inhibitor: Secondary | ICD-10-CM | POA: Diagnosis not present

## 2023-03-03 DIAGNOSIS — E1152 Type 2 diabetes mellitus with diabetic peripheral angiopathy with gangrene: Secondary | ICD-10-CM | POA: Diagnosis not present

## 2023-03-03 DIAGNOSIS — I959 Hypotension, unspecified: Secondary | ICD-10-CM | POA: Diagnosis not present

## 2023-03-03 DIAGNOSIS — Z452 Encounter for adjustment and management of vascular access device: Secondary | ICD-10-CM | POA: Diagnosis not present

## 2023-03-03 DIAGNOSIS — I10 Essential (primary) hypertension: Secondary | ICD-10-CM | POA: Diagnosis not present

## 2023-03-03 DIAGNOSIS — G9341 Metabolic encephalopathy: Secondary | ICD-10-CM | POA: Diagnosis not present

## 2023-03-03 DIAGNOSIS — N179 Acute kidney failure, unspecified: Secondary | ICD-10-CM | POA: Diagnosis not present

## 2023-03-03 DIAGNOSIS — M8629 Subacute osteomyelitis, multiple sites: Secondary | ICD-10-CM | POA: Diagnosis not present

## 2023-03-03 DIAGNOSIS — B3731 Acute candidiasis of vulva and vagina: Secondary | ICD-10-CM | POA: Diagnosis not present

## 2023-03-03 DIAGNOSIS — M869 Osteomyelitis, unspecified: Secondary | ICD-10-CM | POA: Diagnosis not present

## 2023-03-03 DIAGNOSIS — D84821 Immunodeficiency due to drugs: Secondary | ICD-10-CM | POA: Diagnosis not present

## 2023-03-03 DIAGNOSIS — B3749 Other urogenital candidiasis: Secondary | ICD-10-CM | POA: Diagnosis not present

## 2023-03-03 DIAGNOSIS — Z940 Kidney transplant status: Secondary | ICD-10-CM | POA: Diagnosis not present

## 2023-03-03 DIAGNOSIS — Z9911 Dependence on respirator [ventilator] status: Secondary | ICD-10-CM | POA: Diagnosis not present

## 2023-03-03 DIAGNOSIS — T8613 Kidney transplant infection: Secondary | ICD-10-CM | POA: Diagnosis not present

## 2023-03-03 DIAGNOSIS — J988 Other specified respiratory disorders: Secondary | ICD-10-CM | POA: Diagnosis not present

## 2023-03-03 DIAGNOSIS — J9601 Acute respiratory failure with hypoxia: Secondary | ICD-10-CM | POA: Diagnosis not present

## 2023-03-03 DIAGNOSIS — R4182 Altered mental status, unspecified: Secondary | ICD-10-CM | POA: Diagnosis not present

## 2023-03-03 DIAGNOSIS — I70261 Atherosclerosis of native arteries of extremities with gangrene, right leg: Secondary | ICD-10-CM | POA: Diagnosis not present

## 2023-03-03 DIAGNOSIS — J810 Acute pulmonary edema: Secondary | ICD-10-CM | POA: Diagnosis not present

## 2023-03-03 DIAGNOSIS — J811 Chronic pulmonary edema: Secondary | ICD-10-CM | POA: Diagnosis not present

## 2023-03-03 DIAGNOSIS — J9811 Atelectasis: Secondary | ICD-10-CM | POA: Diagnosis not present

## 2023-03-04 DIAGNOSIS — I2510 Atherosclerotic heart disease of native coronary artery without angina pectoris: Secondary | ICD-10-CM | POA: Diagnosis not present

## 2023-03-04 DIAGNOSIS — I70261 Atherosclerosis of native arteries of extremities with gangrene, right leg: Secondary | ICD-10-CM | POA: Diagnosis not present

## 2023-03-04 DIAGNOSIS — Z79621 Long term (current) use of calcineurin inhibitor: Secondary | ICD-10-CM | POA: Diagnosis not present

## 2023-03-04 DIAGNOSIS — B3749 Other urogenital candidiasis: Secondary | ICD-10-CM | POA: Diagnosis not present

## 2023-03-04 DIAGNOSIS — M8669 Other chronic osteomyelitis, multiple sites: Secondary | ICD-10-CM | POA: Diagnosis not present

## 2023-03-04 DIAGNOSIS — J9601 Acute respiratory failure with hypoxia: Secondary | ICD-10-CM | POA: Diagnosis not present

## 2023-03-04 DIAGNOSIS — I5023 Acute on chronic systolic (congestive) heart failure: Secondary | ICD-10-CM | POA: Diagnosis not present

## 2023-03-04 DIAGNOSIS — N179 Acute kidney failure, unspecified: Secondary | ICD-10-CM | POA: Diagnosis not present

## 2023-03-04 DIAGNOSIS — T8613 Kidney transplant infection: Secondary | ICD-10-CM | POA: Diagnosis not present

## 2023-03-04 DIAGNOSIS — I088 Other rheumatic multiple valve diseases: Secondary | ICD-10-CM | POA: Diagnosis not present

## 2023-03-04 DIAGNOSIS — I959 Hypotension, unspecified: Secondary | ICD-10-CM | POA: Diagnosis not present

## 2023-03-04 DIAGNOSIS — J810 Acute pulmonary edema: Secondary | ICD-10-CM | POA: Diagnosis not present

## 2023-03-04 DIAGNOSIS — B3731 Acute candidiasis of vulva and vagina: Secondary | ICD-10-CM | POA: Diagnosis not present

## 2023-03-04 DIAGNOSIS — G9340 Encephalopathy, unspecified: Secondary | ICD-10-CM | POA: Diagnosis not present

## 2023-03-04 DIAGNOSIS — D84821 Immunodeficiency due to drugs: Secondary | ICD-10-CM | POA: Diagnosis not present

## 2023-03-04 DIAGNOSIS — G9341 Metabolic encephalopathy: Secondary | ICD-10-CM | POA: Diagnosis not present

## 2023-03-04 DIAGNOSIS — Z940 Kidney transplant status: Secondary | ICD-10-CM | POA: Diagnosis not present

## 2023-03-04 DIAGNOSIS — G629 Polyneuropathy, unspecified: Secondary | ICD-10-CM | POA: Diagnosis not present

## 2023-03-05 DIAGNOSIS — N189 Chronic kidney disease, unspecified: Secondary | ICD-10-CM | POA: Diagnosis not present

## 2023-03-05 DIAGNOSIS — E1140 Type 2 diabetes mellitus with diabetic neuropathy, unspecified: Secondary | ICD-10-CM | POA: Diagnosis not present

## 2023-03-05 DIAGNOSIS — N179 Acute kidney failure, unspecified: Secondary | ICD-10-CM | POA: Diagnosis not present

## 2023-03-05 DIAGNOSIS — I5030 Unspecified diastolic (congestive) heart failure: Secondary | ICD-10-CM | POA: Diagnosis not present

## 2023-03-05 DIAGNOSIS — D84821 Immunodeficiency due to drugs: Secondary | ICD-10-CM | POA: Diagnosis not present

## 2023-03-05 DIAGNOSIS — B3749 Other urogenital candidiasis: Secondary | ICD-10-CM | POA: Diagnosis not present

## 2023-03-05 DIAGNOSIS — Z79621 Long term (current) use of calcineurin inhibitor: Secondary | ICD-10-CM | POA: Diagnosis not present

## 2023-03-05 DIAGNOSIS — I739 Peripheral vascular disease, unspecified: Secondary | ICD-10-CM | POA: Diagnosis not present

## 2023-03-05 DIAGNOSIS — I5032 Chronic diastolic (congestive) heart failure: Secondary | ICD-10-CM | POA: Diagnosis not present

## 2023-03-05 DIAGNOSIS — M8669 Other chronic osteomyelitis, multiple sites: Secondary | ICD-10-CM | POA: Diagnosis not present

## 2023-03-05 DIAGNOSIS — I2510 Atherosclerotic heart disease of native coronary artery without angina pectoris: Secondary | ICD-10-CM | POA: Diagnosis not present

## 2023-03-05 DIAGNOSIS — T8613 Kidney transplant infection: Secondary | ICD-10-CM | POA: Diagnosis not present

## 2023-03-05 DIAGNOSIS — E1122 Type 2 diabetes mellitus with diabetic chronic kidney disease: Secondary | ICD-10-CM | POA: Diagnosis not present

## 2023-03-05 DIAGNOSIS — Z940 Kidney transplant status: Secondary | ICD-10-CM | POA: Diagnosis not present

## 2023-03-05 DIAGNOSIS — I110 Hypertensive heart disease with heart failure: Secondary | ICD-10-CM | POA: Diagnosis not present

## 2023-03-05 DIAGNOSIS — J810 Acute pulmonary edema: Secondary | ICD-10-CM | POA: Diagnosis not present

## 2023-03-05 DIAGNOSIS — I70261 Atherosclerosis of native arteries of extremities with gangrene, right leg: Secondary | ICD-10-CM | POA: Diagnosis not present

## 2023-03-05 DIAGNOSIS — B3731 Acute candidiasis of vulva and vagina: Secondary | ICD-10-CM | POA: Diagnosis not present

## 2023-03-05 DIAGNOSIS — G9341 Metabolic encephalopathy: Secondary | ICD-10-CM | POA: Diagnosis not present

## 2023-03-05 DIAGNOSIS — J9601 Acute respiratory failure with hypoxia: Secondary | ICD-10-CM | POA: Diagnosis not present

## 2023-03-06 DIAGNOSIS — I739 Peripheral vascular disease, unspecified: Principal | ICD-10-CM

## 2023-03-06 DIAGNOSIS — N179 Acute kidney failure, unspecified: Secondary | ICD-10-CM | POA: Diagnosis not present

## 2023-03-06 DIAGNOSIS — I70261 Atherosclerosis of native arteries of extremities with gangrene, right leg: Secondary | ICD-10-CM | POA: Diagnosis not present

## 2023-03-06 DIAGNOSIS — B3749 Other urogenital candidiasis: Secondary | ICD-10-CM | POA: Diagnosis not present

## 2023-03-06 DIAGNOSIS — J811 Chronic pulmonary edema: Secondary | ICD-10-CM | POA: Diagnosis not present

## 2023-03-06 DIAGNOSIS — Z79621 Long term (current) use of calcineurin inhibitor: Secondary | ICD-10-CM | POA: Diagnosis not present

## 2023-03-06 DIAGNOSIS — J9601 Acute respiratory failure with hypoxia: Secondary | ICD-10-CM | POA: Diagnosis not present

## 2023-03-06 DIAGNOSIS — B3731 Acute candidiasis of vulva and vagina: Secondary | ICD-10-CM | POA: Diagnosis not present

## 2023-03-06 DIAGNOSIS — M8669 Other chronic osteomyelitis, multiple sites: Secondary | ICD-10-CM | POA: Diagnosis not present

## 2023-03-06 DIAGNOSIS — Z940 Kidney transplant status: Secondary | ICD-10-CM | POA: Diagnosis not present

## 2023-03-06 DIAGNOSIS — Z452 Encounter for adjustment and management of vascular access device: Secondary | ICD-10-CM | POA: Diagnosis not present

## 2023-03-06 DIAGNOSIS — D84821 Immunodeficiency due to drugs: Secondary | ICD-10-CM | POA: Diagnosis not present

## 2023-03-06 DIAGNOSIS — J90 Pleural effusion, not elsewhere classified: Secondary | ICD-10-CM | POA: Diagnosis not present

## 2023-03-06 DIAGNOSIS — T8613 Kidney transplant infection: Secondary | ICD-10-CM | POA: Diagnosis not present

## 2023-03-07 DIAGNOSIS — N179 Acute kidney failure, unspecified: Secondary | ICD-10-CM | POA: Diagnosis not present

## 2023-03-07 DIAGNOSIS — G9341 Metabolic encephalopathy: Secondary | ICD-10-CM | POA: Diagnosis not present

## 2023-03-07 DIAGNOSIS — J810 Acute pulmonary edema: Secondary | ICD-10-CM | POA: Diagnosis not present

## 2023-03-07 DIAGNOSIS — J9601 Acute respiratory failure with hypoxia: Secondary | ICD-10-CM | POA: Diagnosis not present

## 2023-03-08 DIAGNOSIS — K921 Melena: Secondary | ICD-10-CM | POA: Diagnosis not present

## 2023-03-08 DIAGNOSIS — D84821 Immunodeficiency due to drugs: Secondary | ICD-10-CM | POA: Diagnosis not present

## 2023-03-08 DIAGNOSIS — N179 Acute kidney failure, unspecified: Secondary | ICD-10-CM | POA: Diagnosis not present

## 2023-03-08 DIAGNOSIS — Z940 Kidney transplant status: Secondary | ICD-10-CM | POA: Diagnosis not present

## 2023-03-08 DIAGNOSIS — Z79621 Long term (current) use of calcineurin inhibitor: Secondary | ICD-10-CM | POA: Diagnosis not present

## 2023-03-08 DIAGNOSIS — D649 Anemia, unspecified: Secondary | ICD-10-CM | POA: Diagnosis not present

## 2023-03-08 DIAGNOSIS — J9601 Acute respiratory failure with hypoxia: Secondary | ICD-10-CM | POA: Diagnosis not present

## 2023-03-09 DIAGNOSIS — Z940 Kidney transplant status: Principal | ICD-10-CM

## 2023-03-09 DIAGNOSIS — D84821 Immunodeficiency due to drugs: Secondary | ICD-10-CM | POA: Diagnosis not present

## 2023-03-09 DIAGNOSIS — J9601 Acute respiratory failure with hypoxia: Secondary | ICD-10-CM | POA: Diagnosis not present

## 2023-03-09 DIAGNOSIS — Z79621 Long term (current) use of calcineurin inhibitor: Secondary | ICD-10-CM | POA: Diagnosis not present

## 2023-03-09 DIAGNOSIS — K921 Melena: Secondary | ICD-10-CM | POA: Diagnosis not present

## 2023-03-09 DIAGNOSIS — D649 Anemia, unspecified: Secondary | ICD-10-CM | POA: Diagnosis not present

## 2023-03-09 DIAGNOSIS — G9341 Metabolic encephalopathy: Secondary | ICD-10-CM | POA: Diagnosis not present

## 2023-03-09 DIAGNOSIS — N179 Acute kidney failure, unspecified: Secondary | ICD-10-CM | POA: Diagnosis not present

## 2023-03-10 DIAGNOSIS — K921 Melena: Secondary | ICD-10-CM | POA: Diagnosis not present

## 2023-03-10 DIAGNOSIS — N179 Acute kidney failure, unspecified: Secondary | ICD-10-CM | POA: Diagnosis not present

## 2023-03-10 DIAGNOSIS — I10 Essential (primary) hypertension: Secondary | ICD-10-CM | POA: Diagnosis not present

## 2023-03-10 DIAGNOSIS — D62 Acute posthemorrhagic anemia: Secondary | ICD-10-CM | POA: Diagnosis not present

## 2023-03-10 DIAGNOSIS — G9341 Metabolic encephalopathy: Secondary | ICD-10-CM | POA: Diagnosis not present

## 2023-03-11 DIAGNOSIS — N179 Acute kidney failure, unspecified: Secondary | ICD-10-CM | POA: Diagnosis not present

## 2023-03-11 DIAGNOSIS — G9341 Metabolic encephalopathy: Secondary | ICD-10-CM | POA: Diagnosis not present

## 2023-03-11 DIAGNOSIS — Z79621 Long term (current) use of calcineurin inhibitor: Secondary | ICD-10-CM | POA: Diagnosis not present

## 2023-03-11 DIAGNOSIS — D84821 Immunodeficiency due to drugs: Secondary | ICD-10-CM | POA: Diagnosis not present

## 2023-03-11 DIAGNOSIS — Z940 Kidney transplant status: Secondary | ICD-10-CM | POA: Diagnosis not present

## 2023-03-11 DIAGNOSIS — K921 Melena: Secondary | ICD-10-CM | POA: Diagnosis not present

## 2023-03-12 DIAGNOSIS — Z79621 Long term (current) use of calcineurin inhibitor: Secondary | ICD-10-CM | POA: Diagnosis not present

## 2023-03-12 DIAGNOSIS — N179 Acute kidney failure, unspecified: Secondary | ICD-10-CM | POA: Diagnosis not present

## 2023-03-12 DIAGNOSIS — G9341 Metabolic encephalopathy: Secondary | ICD-10-CM | POA: Diagnosis not present

## 2023-03-12 DIAGNOSIS — Z940 Kidney transplant status: Secondary | ICD-10-CM | POA: Diagnosis not present

## 2023-03-12 DIAGNOSIS — D84821 Immunodeficiency due to drugs: Secondary | ICD-10-CM | POA: Diagnosis not present

## 2023-03-13 DIAGNOSIS — I96 Gangrene, not elsewhere classified: Secondary | ICD-10-CM | POA: Diagnosis not present

## 2023-03-13 DIAGNOSIS — Z79621 Long term (current) use of calcineurin inhibitor: Secondary | ICD-10-CM | POA: Diagnosis not present

## 2023-03-13 DIAGNOSIS — I10 Essential (primary) hypertension: Secondary | ICD-10-CM | POA: Diagnosis not present

## 2023-03-13 DIAGNOSIS — Z940 Kidney transplant status: Secondary | ICD-10-CM | POA: Diagnosis not present

## 2023-03-13 DIAGNOSIS — N179 Acute kidney failure, unspecified: Secondary | ICD-10-CM | POA: Diagnosis not present

## 2023-03-13 DIAGNOSIS — D84821 Immunodeficiency due to drugs: Secondary | ICD-10-CM | POA: Diagnosis not present

## 2023-03-14 DIAGNOSIS — Z79621 Long term (current) use of calcineurin inhibitor: Secondary | ICD-10-CM | POA: Diagnosis not present

## 2023-03-14 DIAGNOSIS — I10 Essential (primary) hypertension: Secondary | ICD-10-CM | POA: Diagnosis not present

## 2023-03-14 DIAGNOSIS — D84821 Immunodeficiency due to drugs: Secondary | ICD-10-CM | POA: Diagnosis not present

## 2023-03-14 DIAGNOSIS — Z940 Kidney transplant status: Secondary | ICD-10-CM | POA: Diagnosis not present

## 2023-03-14 DIAGNOSIS — N179 Acute kidney failure, unspecified: Secondary | ICD-10-CM | POA: Diagnosis not present

## 2023-03-15 DIAGNOSIS — Z79621 Long term (current) use of calcineurin inhibitor: Secondary | ICD-10-CM | POA: Diagnosis not present

## 2023-03-15 DIAGNOSIS — R627 Adult failure to thrive: Secondary | ICD-10-CM | POA: Diagnosis not present

## 2023-03-15 DIAGNOSIS — N179 Acute kidney failure, unspecified: Secondary | ICD-10-CM | POA: Diagnosis not present

## 2023-03-15 DIAGNOSIS — I10 Essential (primary) hypertension: Secondary | ICD-10-CM | POA: Diagnosis not present

## 2023-03-15 DIAGNOSIS — Z940 Kidney transplant status: Secondary | ICD-10-CM | POA: Diagnosis not present

## 2023-03-15 DIAGNOSIS — D84821 Immunodeficiency due to drugs: Secondary | ICD-10-CM | POA: Diagnosis not present

## 2023-03-16 DIAGNOSIS — Z940 Kidney transplant status: Principal | ICD-10-CM

## 2023-03-16 DIAGNOSIS — R627 Adult failure to thrive: Secondary | ICD-10-CM | POA: Diagnosis not present

## 2023-03-16 DIAGNOSIS — D84821 Immunodeficiency due to drugs: Secondary | ICD-10-CM | POA: Diagnosis not present

## 2023-03-16 DIAGNOSIS — Z79621 Long term (current) use of calcineurin inhibitor: Secondary | ICD-10-CM | POA: Diagnosis not present

## 2023-03-16 DIAGNOSIS — N179 Acute kidney failure, unspecified: Secondary | ICD-10-CM | POA: Diagnosis not present

## 2023-03-17 DIAGNOSIS — Z79621 Long term (current) use of calcineurin inhibitor: Secondary | ICD-10-CM | POA: Diagnosis not present

## 2023-03-17 DIAGNOSIS — D84821 Immunodeficiency due to drugs: Secondary | ICD-10-CM | POA: Diagnosis not present

## 2023-03-17 DIAGNOSIS — R627 Adult failure to thrive: Secondary | ICD-10-CM | POA: Diagnosis not present

## 2023-03-17 DIAGNOSIS — Z940 Kidney transplant status: Secondary | ICD-10-CM | POA: Diagnosis not present

## 2023-03-17 DIAGNOSIS — N179 Acute kidney failure, unspecified: Secondary | ICD-10-CM | POA: Diagnosis not present

## 2023-03-20 MED ORDER — FUROSEMIDE 40 MG TABLET
ORAL_TABLET | Freq: Two times a day (BID) | ORAL | 0 refills | 8 days | Status: CP | PRN
Start: 2023-03-20 — End: 2023-04-19
  Filled 2023-03-20: qty 30, 8d supply, fill #0

## 2023-03-20 MED ORDER — ONDANSETRON 4 MG DISINTEGRATING TABLET
ORAL_TABLET | Freq: Three times a day (TID) | 0 refills | 10 days | Status: CP | PRN
Start: 2023-03-20 — End: 2023-03-30
  Filled 2023-03-20: qty 30, 10d supply, fill #0

## 2023-03-20 MED ORDER — OXYCODONE 5 MG TABLET
ORAL_TABLET | ORAL | 0 refills | 4 days | Status: CP | PRN
Start: 2023-03-20 — End: 2023-03-25
  Filled 2023-03-20: qty 20, 4d supply, fill #0

## 2023-03-20 MED ORDER — MELATONIN 3 MG TABLET
ORAL_TABLET | Freq: Every evening | ORAL | 0 refills | 100 days | Status: CP | PRN
Start: 2023-03-20 — End: ?
  Filled 2023-03-20: qty 100, 100d supply, fill #0

## 2023-03-20 MED ORDER — MORPHINE CONCENTRATE 20 MG/ML ORAL SOLUTION WRAPPER
ORAL | 0 refills | 10 days | Status: CP | PRN
Start: 2023-03-20 — End: 2023-03-30
  Filled 2023-03-20: qty 30, 10d supply, fill #0

## 2023-03-20 MED ORDER — POLYETHYLENE GLYCOL 3350 17 GRAM/DOSE ORAL POWDER
Freq: Every day | ORAL | 0 refills | 30 days | Status: CP | PRN
Start: 2023-03-20 — End: 2023-04-19
  Filled 2023-03-20: qty 238, 14d supply, fill #0

## 2023-03-20 MED ORDER — PREGABALIN 25 MG CAPSULE
ORAL_CAPSULE | ORAL | 0 refills | 30 days | Status: CP
Start: 2023-03-20 — End: 2023-04-19
  Filled 2023-03-20: qty 90, 30d supply, fill #0

## 2023-03-20 MED ORDER — TACROLIMUS 1 MG CAPSULE, IMMEDIATE-RELEASE
ORAL_CAPSULE | Freq: Two times a day (BID) | ORAL | 0 refills | 30 days | Status: CP
Start: 2023-03-20 — End: 2023-04-19
  Filled 2023-03-20: qty 180, 30d supply, fill #0

## 2023-03-21 MED ORDER — LIDOCAINE 4 % TOPICAL PATCH
MEDICATED_PATCH | Freq: Every day | TRANSDERMAL | 0 refills | 30 days | Status: CP
Start: 2023-03-21 — End: ?
  Filled 2023-03-20: qty 10, 10d supply, fill #0

## 2023-03-23 DIAGNOSIS — N186 End stage renal disease: Principal | ICD-10-CM

## 2023-03-23 DIAGNOSIS — Z940 Kidney transplant status: Principal | ICD-10-CM

## 2023-03-30 DIAGNOSIS — Z940 Kidney transplant status: Principal | ICD-10-CM

## 2023-04-06 DIAGNOSIS — Z940 Kidney transplant status: Principal | ICD-10-CM

## 2023-04-13 DIAGNOSIS — Z940 Kidney transplant status: Principal | ICD-10-CM

## 2023-04-20 DIAGNOSIS — Z940 Kidney transplant status: Principal | ICD-10-CM

## 2023-04-20 DIAGNOSIS — N186 End stage renal disease: Principal | ICD-10-CM

## 2023-04-27 DIAGNOSIS — Z940 Kidney transplant status: Principal | ICD-10-CM

## 2023-05-04 DIAGNOSIS — Z940 Kidney transplant status: Principal | ICD-10-CM

## 2023-05-11 DIAGNOSIS — Z940 Kidney transplant status: Principal | ICD-10-CM

## 2023-05-12 DIAGNOSIS — Z940 Kidney transplant status: Principal | ICD-10-CM

## 2023-05-14 DIAGNOSIS — Z940 Kidney transplant status: Principal | ICD-10-CM

## 2023-05-18 DIAGNOSIS — N186 End stage renal disease: Principal | ICD-10-CM

## 2023-05-18 DIAGNOSIS — Z940 Kidney transplant status: Principal | ICD-10-CM

## 2023-05-19 ENCOUNTER — Other Ambulatory Visit: Payer: Self-pay

## 2023-05-19 ENCOUNTER — Emergency Department: Payer: Medicare Other

## 2023-05-19 ENCOUNTER — Inpatient Hospital Stay
Admission: EM | Admit: 2023-05-19 | Discharge: 2023-06-10 | DRG: 239 | Disposition: E | Payer: Medicare Other | Attending: Internal Medicine | Admitting: Internal Medicine

## 2023-05-19 DIAGNOSIS — L03115 Cellulitis of right lower limb: Secondary | ICD-10-CM | POA: Diagnosis present

## 2023-05-19 DIAGNOSIS — L89320 Pressure ulcer of left buttock, unstageable: Secondary | ICD-10-CM | POA: Diagnosis not present

## 2023-05-19 DIAGNOSIS — E113543 Type 2 diabetes mellitus with proliferative diabetic retinopathy with combined traction retinal detachment and rhegmatogenous retinal detachment, bilateral: Secondary | ICD-10-CM | POA: Diagnosis present

## 2023-05-19 DIAGNOSIS — Z95828 Presence of other vascular implants and grafts: Secondary | ICD-10-CM

## 2023-05-19 DIAGNOSIS — N179 Acute kidney failure, unspecified: Secondary | ICD-10-CM | POA: Diagnosis not present

## 2023-05-19 DIAGNOSIS — L089 Local infection of the skin and subcutaneous tissue, unspecified: Secondary | ICD-10-CM | POA: Diagnosis not present

## 2023-05-19 DIAGNOSIS — F0390 Unspecified dementia without behavioral disturbance: Secondary | ICD-10-CM | POA: Diagnosis present

## 2023-05-19 DIAGNOSIS — R627 Adult failure to thrive: Secondary | ICD-10-CM | POA: Diagnosis not present

## 2023-05-19 DIAGNOSIS — I708 Atherosclerosis of other arteries: Secondary | ICD-10-CM | POA: Diagnosis not present

## 2023-05-19 DIAGNOSIS — I7090 Unspecified atherosclerosis: Secondary | ICD-10-CM | POA: Diagnosis not present

## 2023-05-19 DIAGNOSIS — Z66 Do not resuscitate: Secondary | ICD-10-CM | POA: Diagnosis present

## 2023-05-19 DIAGNOSIS — Z79624 Long term (current) use of inhibitors of nucleotide synthesis: Secondary | ICD-10-CM

## 2023-05-19 DIAGNOSIS — I5032 Chronic diastolic (congestive) heart failure: Secondary | ICD-10-CM | POA: Diagnosis present

## 2023-05-19 DIAGNOSIS — F32A Depression, unspecified: Secondary | ICD-10-CM | POA: Diagnosis present

## 2023-05-19 DIAGNOSIS — E861 Hypovolemia: Secondary | ICD-10-CM | POA: Diagnosis present

## 2023-05-19 DIAGNOSIS — G9341 Metabolic encephalopathy: Secondary | ICD-10-CM | POA: Diagnosis not present

## 2023-05-19 DIAGNOSIS — E1152 Type 2 diabetes mellitus with diabetic peripheral angiopathy with gangrene: Secondary | ICD-10-CM | POA: Diagnosis not present

## 2023-05-19 DIAGNOSIS — I700 Atherosclerosis of aorta: Secondary | ICD-10-CM | POA: Diagnosis present

## 2023-05-19 DIAGNOSIS — D573 Sickle-cell trait: Secondary | ICD-10-CM | POA: Diagnosis present

## 2023-05-19 DIAGNOSIS — I70201 Unspecified atherosclerosis of native arteries of extremities, right leg: Secondary | ICD-10-CM | POA: Diagnosis not present

## 2023-05-19 DIAGNOSIS — M19071 Primary osteoarthritis, right ankle and foot: Secondary | ICD-10-CM | POA: Diagnosis present

## 2023-05-19 DIAGNOSIS — Z5982 Transportation insecurity: Secondary | ICD-10-CM

## 2023-05-19 DIAGNOSIS — E041 Nontoxic single thyroid nodule: Secondary | ICD-10-CM | POA: Diagnosis present

## 2023-05-19 DIAGNOSIS — Z8616 Personal history of COVID-19: Secondary | ICD-10-CM

## 2023-05-19 DIAGNOSIS — E785 Hyperlipidemia, unspecified: Secondary | ICD-10-CM | POA: Diagnosis present

## 2023-05-19 DIAGNOSIS — D638 Anemia in other chronic diseases classified elsewhere: Secondary | ICD-10-CM | POA: Diagnosis present

## 2023-05-19 DIAGNOSIS — D631 Anemia in chronic kidney disease: Secondary | ICD-10-CM | POA: Diagnosis not present

## 2023-05-19 DIAGNOSIS — Z515 Encounter for palliative care: Secondary | ICD-10-CM | POA: Diagnosis not present

## 2023-05-19 DIAGNOSIS — Z885 Allergy status to narcotic agent status: Secondary | ICD-10-CM

## 2023-05-19 DIAGNOSIS — L97509 Non-pressure chronic ulcer of other part of unspecified foot with unspecified severity: Secondary | ICD-10-CM | POA: Diagnosis not present

## 2023-05-19 DIAGNOSIS — Y830 Surgical operation with transplant of whole organ as the cause of abnormal reaction of the patient, or of later complication, without mention of misadventure at the time of the procedure: Secondary | ICD-10-CM | POA: Diagnosis not present

## 2023-05-19 DIAGNOSIS — Z7984 Long term (current) use of oral hypoglycemic drugs: Secondary | ICD-10-CM | POA: Diagnosis not present

## 2023-05-19 DIAGNOSIS — Z993 Dependence on wheelchair: Secondary | ICD-10-CM

## 2023-05-19 DIAGNOSIS — E876 Hypokalemia: Secondary | ICD-10-CM | POA: Diagnosis not present

## 2023-05-19 DIAGNOSIS — Z87891 Personal history of nicotine dependence: Secondary | ICD-10-CM

## 2023-05-19 DIAGNOSIS — I10 Essential (primary) hypertension: Secondary | ICD-10-CM | POA: Diagnosis not present

## 2023-05-19 DIAGNOSIS — E559 Vitamin D deficiency, unspecified: Secondary | ICD-10-CM | POA: Diagnosis present

## 2023-05-19 DIAGNOSIS — J439 Emphysema, unspecified: Secondary | ICD-10-CM | POA: Diagnosis present

## 2023-05-19 DIAGNOSIS — I252 Old myocardial infarction: Secondary | ICD-10-CM

## 2023-05-19 DIAGNOSIS — D696 Thrombocytopenia, unspecified: Secondary | ICD-10-CM | POA: Diagnosis present

## 2023-05-19 DIAGNOSIS — E871 Hypo-osmolality and hyponatremia: Secondary | ICD-10-CM | POA: Diagnosis not present

## 2023-05-19 DIAGNOSIS — L89620 Pressure ulcer of left heel, unstageable: Secondary | ICD-10-CM | POA: Diagnosis not present

## 2023-05-19 DIAGNOSIS — I96 Gangrene, not elsewhere classified: Secondary | ICD-10-CM | POA: Diagnosis present

## 2023-05-19 DIAGNOSIS — Z940 Kidney transplant status: Secondary | ICD-10-CM

## 2023-05-19 DIAGNOSIS — E860 Dehydration: Secondary | ICD-10-CM | POA: Diagnosis present

## 2023-05-19 DIAGNOSIS — G4733 Obstructive sleep apnea (adult) (pediatric): Secondary | ICD-10-CM | POA: Diagnosis present

## 2023-05-19 DIAGNOSIS — E8809 Other disorders of plasma-protein metabolism, not elsewhere classified: Secondary | ICD-10-CM | POA: Diagnosis present

## 2023-05-19 DIAGNOSIS — I129 Hypertensive chronic kidney disease with stage 1 through stage 4 chronic kidney disease, or unspecified chronic kidney disease: Secondary | ICD-10-CM | POA: Diagnosis not present

## 2023-05-19 DIAGNOSIS — J449 Chronic obstructive pulmonary disease, unspecified: Secondary | ICD-10-CM | POA: Diagnosis present

## 2023-05-19 DIAGNOSIS — N2581 Secondary hyperparathyroidism of renal origin: Secondary | ICD-10-CM | POA: Diagnosis not present

## 2023-05-19 DIAGNOSIS — A480 Gas gangrene: Secondary | ICD-10-CM | POA: Diagnosis not present

## 2023-05-19 DIAGNOSIS — I110 Hypertensive heart disease with heart failure: Secondary | ICD-10-CM | POA: Diagnosis present

## 2023-05-19 DIAGNOSIS — Z90710 Acquired absence of both cervix and uterus: Secondary | ICD-10-CM

## 2023-05-19 DIAGNOSIS — Z751 Person awaiting admission to adequate facility elsewhere: Secondary | ICD-10-CM

## 2023-05-19 DIAGNOSIS — E11621 Type 2 diabetes mellitus with foot ulcer: Secondary | ICD-10-CM | POA: Diagnosis present

## 2023-05-19 DIAGNOSIS — F05 Delirium due to known physiological condition: Secondary | ICD-10-CM | POA: Diagnosis not present

## 2023-05-19 DIAGNOSIS — I340 Nonrheumatic mitral (valve) insufficiency: Secondary | ICD-10-CM | POA: Diagnosis present

## 2023-05-19 DIAGNOSIS — K5909 Other constipation: Secondary | ICD-10-CM | POA: Diagnosis present

## 2023-05-19 DIAGNOSIS — T8619 Other complication of kidney transplant: Secondary | ICD-10-CM | POA: Diagnosis not present

## 2023-05-19 DIAGNOSIS — F0153 Vascular dementia, unspecified severity, with mood disturbance: Secondary | ICD-10-CM | POA: Diagnosis present

## 2023-05-19 DIAGNOSIS — N1832 Chronic kidney disease, stage 3b: Secondary | ICD-10-CM | POA: Diagnosis not present

## 2023-05-19 DIAGNOSIS — Z7189 Other specified counseling: Secondary | ICD-10-CM | POA: Diagnosis not present

## 2023-05-19 DIAGNOSIS — Z6822 Body mass index (BMI) 22.0-22.9, adult: Secondary | ICD-10-CM

## 2023-05-19 DIAGNOSIS — E11649 Type 2 diabetes mellitus with hypoglycemia without coma: Secondary | ICD-10-CM | POA: Diagnosis not present

## 2023-05-19 DIAGNOSIS — E875 Hyperkalemia: Secondary | ICD-10-CM | POA: Diagnosis not present

## 2023-05-19 DIAGNOSIS — L89153 Pressure ulcer of sacral region, stage 3: Secondary | ICD-10-CM | POA: Diagnosis present

## 2023-05-19 DIAGNOSIS — Z833 Family history of diabetes mellitus: Secondary | ICD-10-CM

## 2023-05-19 DIAGNOSIS — R262 Difficulty in walking, not elsewhere classified: Secondary | ICD-10-CM | POA: Diagnosis present

## 2023-05-19 DIAGNOSIS — Z955 Presence of coronary angioplasty implant and graft: Secondary | ICD-10-CM

## 2023-05-19 DIAGNOSIS — L97518 Non-pressure chronic ulcer of other part of right foot with other specified severity: Secondary | ICD-10-CM | POA: Diagnosis present

## 2023-05-19 DIAGNOSIS — Z888 Allergy status to other drugs, medicaments and biological substances status: Secondary | ICD-10-CM

## 2023-05-19 DIAGNOSIS — Z8249 Family history of ischemic heart disease and other diseases of the circulatory system: Secondary | ICD-10-CM

## 2023-05-19 DIAGNOSIS — Z9889 Other specified postprocedural states: Secondary | ICD-10-CM | POA: Diagnosis not present

## 2023-05-19 DIAGNOSIS — I959 Hypotension, unspecified: Secondary | ICD-10-CM | POA: Diagnosis not present

## 2023-05-19 DIAGNOSIS — Z79899 Other long term (current) drug therapy: Secondary | ICD-10-CM

## 2023-05-19 DIAGNOSIS — I2510 Atherosclerotic heart disease of native coronary artery without angina pectoris: Secondary | ICD-10-CM | POA: Diagnosis present

## 2023-05-19 DIAGNOSIS — E1140 Type 2 diabetes mellitus with diabetic neuropathy, unspecified: Secondary | ICD-10-CM | POA: Diagnosis present

## 2023-05-19 DIAGNOSIS — Z79891 Long term (current) use of opiate analgesic: Secondary | ICD-10-CM

## 2023-05-19 DIAGNOSIS — I70261 Atherosclerosis of native arteries of extremities with gangrene, right leg: Secondary | ICD-10-CM | POA: Diagnosis not present

## 2023-05-19 LAB — COMPREHENSIVE METABOLIC PANEL
ALT: 14 U/L (ref 0–44)
AST: 36 U/L (ref 15–41)
Albumin: 1.8 g/dL — ABNORMAL LOW (ref 3.5–5.0)
Alkaline Phosphatase: 154 U/L — ABNORMAL HIGH (ref 38–126)
Anion gap: 12 (ref 5–15)
BUN: 22 mg/dL (ref 8–23)
CO2: 26 mmol/L (ref 22–32)
Calcium: 8.6 mg/dL — ABNORMAL LOW (ref 8.9–10.3)
Chloride: 94 mmol/L — ABNORMAL LOW (ref 98–111)
Creatinine, Ser: 1.56 mg/dL — ABNORMAL HIGH (ref 0.44–1.00)
GFR, Estimated: 37 mL/min — ABNORMAL LOW (ref >60)
Glucose, Bld: 134 mg/dL — ABNORMAL HIGH (ref 70–99)
Potassium: 3.1 mmol/L — ABNORMAL LOW (ref 3.5–5.1)
Sodium: 132 mmol/L — ABNORMAL LOW (ref 135–145)
Total Bilirubin: 0.9 mg/dL (ref <1.2)
Total Protein: 6.2 g/dL — ABNORMAL LOW (ref 6.5–8.1)

## 2023-05-19 LAB — CBC WITH DIFFERENTIAL/PLATELET
Abs Immature Granulocytes: 0.03 10*3/uL (ref 0.00–0.07)
Basophils Absolute: 0 10*3/uL (ref 0.0–0.1)
Basophils Relative: 0 %
Eosinophils Absolute: 0.1 10*3/uL (ref 0.0–0.5)
Eosinophils Relative: 1 %
HCT: 28.5 % — ABNORMAL LOW (ref 36.0–46.0)
Hemoglobin: 9.3 g/dL — ABNORMAL LOW (ref 12.0–15.0)
Immature Granulocytes: 1 %
Lymphocytes Relative: 20 %
Lymphs Abs: 1.1 10*3/uL (ref 0.7–4.0)
MCH: 32.1 pg (ref 26.0–34.0)
MCHC: 32.6 g/dL (ref 30.0–36.0)
MCV: 98.3 fL (ref 80.0–100.0)
Monocytes Absolute: 0.4 10*3/uL (ref 0.1–1.0)
Monocytes Relative: 7 %
Neutro Abs: 4 10*3/uL (ref 1.7–7.7)
Neutrophils Relative %: 71 %
Platelets: 136 10*3/uL — ABNORMAL LOW (ref 150–400)
RBC: 2.9 MIL/uL — ABNORMAL LOW (ref 3.87–5.11)
RDW: 17.4 % — ABNORMAL HIGH (ref 11.5–15.5)
WBC: 5.6 10*3/uL (ref 4.0–10.5)
nRBC: 0 % (ref 0.0–0.2)

## 2023-05-19 LAB — APTT: aPTT: 30 s (ref 24–36)

## 2023-05-19 LAB — CBG MONITORING, ED: Glucose-Capillary: 138 mg/dL — ABNORMAL HIGH (ref 70–99)

## 2023-05-19 LAB — PROTIME-INR
INR: 1.1 (ref 0.8–1.2)
Prothrombin Time: 14.2 s (ref 11.4–15.2)

## 2023-05-19 MED ORDER — HEPARIN BOLUS VIA INFUSION
4600.0000 [IU] | Freq: Once | INTRAVENOUS | Status: AC
Start: 2023-05-19 — End: 2023-05-19
  Administered 2023-05-19: 4600 [IU] via INTRAVENOUS
  Filled 2023-05-19: qty 4600

## 2023-05-19 MED ORDER — ISOSORBIDE MONONITRATE ER 30 MG PO TB24
30.0000 mg | ORAL_TABLET | Freq: Every day | ORAL | Status: DC
Start: 2023-05-20 — End: 2023-05-26
  Administered 2023-05-20 – 2023-05-26 (×6): 30 mg via ORAL
  Filled 2023-05-19 (×7): qty 1

## 2023-05-19 MED ORDER — OXYCODONE-ACETAMINOPHEN 5-325 MG PO TABS
1.0000 | ORAL_TABLET | Freq: Once | ORAL | Status: AC
  Administered 2023-05-19: 1 via ORAL
  Filled 2023-05-19: qty 1

## 2023-05-19 MED ORDER — PREGABALIN 75 MG PO CAPS
75.0000 mg | ORAL_CAPSULE | Freq: Two times a day (BID) | ORAL | Status: DC
  Administered 2023-05-19 – 2023-06-07 (×33): 75 mg via ORAL
  Filled 2023-05-19 (×38): qty 1

## 2023-05-19 MED ORDER — TACROLIMUS ER 4 MG PO TB24
9.0000 mg | ORAL_TABLET | Freq: Every day | ORAL | Status: DC
  Filled 2023-05-19: qty 1

## 2023-05-19 MED ORDER — POTASSIUM CHLORIDE CRYS ER 20 MEQ PO TBCR
40.0000 meq | EXTENDED_RELEASE_TABLET | ORAL | Status: AC
  Administered 2023-05-19 (×2): 40 meq via ORAL
  Filled 2023-05-19 (×2): qty 2

## 2023-05-19 MED ORDER — INSULIN ASPART 100 UNIT/ML IJ SOLN
0.0000 [IU] | Freq: Every day | INTRAMUSCULAR | Status: DC
  Filled 2023-05-19: qty 1

## 2023-05-19 MED ORDER — IPRATROPIUM-ALBUTEROL 0.5-2.5 (3) MG/3ML IN SOLN: 3.0000 mL | Freq: Four times a day (QID) | RESPIRATORY_TRACT | Status: DC | PRN

## 2023-05-19 MED ORDER — CLONIDINE HCL 0.1 MG PO TABS
0.1000 mg | ORAL_TABLET | Freq: Every day | ORAL | Status: DC
Start: 2023-05-19 — End: 2023-05-24
  Administered 2023-05-19 – 2023-05-23 (×4): 0.1 mg via ORAL
  Filled 2023-05-19 (×5): qty 1

## 2023-05-19 MED ORDER — ATORVASTATIN CALCIUM 20 MG PO TABS
20.0000 mg | ORAL_TABLET | Freq: Every day | ORAL | Status: DC
  Administered 2023-05-19 – 2023-06-05 (×15): 20 mg via ORAL
  Filled 2023-05-19 (×18): qty 1

## 2023-05-19 MED ORDER — INSULIN ASPART 100 UNIT/ML IJ SOLN
0.0000 [IU] | Freq: Three times a day (TID) | INTRAMUSCULAR | Status: DC
  Administered 2023-05-20 – 2023-05-21 (×2): 1 [IU] via SUBCUTANEOUS
  Administered 2023-05-22: 2 [IU] via SUBCUTANEOUS
  Administered 2023-05-22: 7 [IU] via SUBCUTANEOUS
  Administered 2023-05-23 – 2023-05-24 (×2): 1 [IU] via SUBCUTANEOUS
  Administered 2023-05-25 – 2023-05-26 (×2): 2 [IU] via SUBCUTANEOUS
  Administered 2023-05-28: 3 [IU] via SUBCUTANEOUS
  Filled 2023-05-19 (×9): qty 1

## 2023-05-19 MED ORDER — HEPARIN (PORCINE) 25000 UT/250ML-% IV SOLN
900.0000 [IU]/h | INTRAVENOUS | Status: DC
  Administered 2023-05-19: 1000 [IU]/h via INTRAVENOUS
  Filled 2023-05-19: qty 250

## 2023-05-19 MED ORDER — PANTOPRAZOLE SODIUM 40 MG PO TBEC
40.0000 mg | DELAYED_RELEASE_TABLET | Freq: Every day | ORAL | Status: DC
  Administered 2023-05-19 – 2023-06-07 (×18): 40 mg via ORAL
  Filled 2023-05-19 (×21): qty 1

## 2023-05-19 NOTE — ED Triage Notes (Addendum)
Pt arrived via EMS from home d/t infection in her right foot. Pt has enormous amount of decay and eschar to her right foot

## 2023-05-19 NOTE — ED Notes (Signed)
Pt declines to keep pulse ox in place at this time.

## 2023-05-19 NOTE — ED Notes (Signed)
First nurse note: Pt here via [facility] from home with gangrene to R foot.   VSS per EMS

## 2023-05-19 NOTE — ED Provider Notes (Signed)
[facility] Provider Note    Event Date/Time   First MD Initiated Contact with Patient 05/19/23 1610     (approximate)  History   Chief Complaint: Wound Infection  HPI  Theresa [name] is a 67 y.o. female with a past medical history of anemia, COPD, CHF, diabetes, ESRD now status post kidney transplant (per chart review), hypertension, hyperlipidemia, presents to the emergency department for right foot evaluation.  Patient is coming via EMS from her group home where they have noticed worsening "infection and decay" of the right foot.  Patient is a very poor historian, she cannot give me an estimate of how long this has been going on, she cannot tell me if she normally ambulates, states she often does but has not for several days (on exam the foot appears to not had blood for several weeks or longer).  Physical Exam   Triage Vital Signs: ED Triage Vitals  Encounter Vitals Group     BP 05/19/23 1340 (!) 129/39     Systolic BP Percentile --      Diastolic BP Percentile --      Pulse Rate 05/19/23 1340 80     Resp 05/19/23 1340 16     Temp 05/19/23 1340 98.5 F (36.9 C)     Temp Source 05/19/23 1340 Oral     SpO2 05/19/23 1340 98 %     Weight --      Height 05/19/23 1341 5\' 7"  (1.702 m)     Head Circumference --      Peak Flow --      Pain Score 05/19/23 1340 9     Pain Loc --      Pain Education --      Exclude from Growth Chart --     Most recent vital signs: Vitals:   05/19/23 1340  BP: (!) 129/39  Pulse: 80  Resp: 16  Temp: 98.5 F (36.9 C)  SpO2: 98%    General: Awake, no distress.  CV:  Good peripheral perfusion.  Regular rate and rhythm  Resp:  Normal effort.  Equal breath sounds bilaterally.  Abd:  No distention.  Soft, nontender.   Other:  Foley catheter present.  Patient has significant eschar of the right foot from the ankle distally consistent with an ischemic foot that appears to have been ischemic for quite some time.  Cool to  the touch.   ED Results / Procedures / Treatments   RADIOLOGY  I have reviewed and interpreted the x-ray images I do not appreciate any gas on my evaluation. 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   MEDICATIONS ORDERED IN ED: Medications - No data to display   IMPRESSION / MDM / ASSESSMENT AND PLAN / ED COURSE  I reviewed the triage vital signs and the nursing notes.  Patient's presentation is most consistent with acute presentation with potential threat to life or bodily function.  Patient presents to the emergency department for possible infection of the right foot.  On examination patient has an ischemic foot that has the appearance of chronic ischemia for quite some time, foot has a mummified appearance.  Will obtain x-rays to rule out gas, CBC shows no significant finding, chronic anemia.  Patient's chemistry is overall reassuring.  Creatinine of 1.5.  Record review from [facility] shows a nephrology note 02/12/2023 showing a kidney transplant 03/30/2019.  On this note it  also states right toe discoloration which was likely the beginning of what we are seeing currently.  We will start the patient on heparin given her ischemic limb.  We will admit to the hospital service and speak to vascular surgery regarding further workup.  X-ray negative for gas.  I discussed the patient with Dr.Dew of vascular surgery who will arrange an angiogram tomorrow and likely amputation.  I discussed this plan of care with the patient and she is agreeable.  Discussed the patient with the hospitalist will be admitting to their service.  CRITICAL CARE Performed by: Minna Antis   Total critical care time: 30 minutes  Critical care time was exclusive of separately billable procedures and treating other patients.  Critical care was necessary to treat or prevent imminent or life-threatening  deterioration.  Critical care was time spent personally by me on the following activities: development of treatment plan with patient and/or surrogate as well as nursing, discussions with consultants, evaluation of patient's response to treatment, examination of patient, obtaining history from patient or surrogate, ordering and performing treatments and interventions, ordering and review of laboratory studies, ordering and review of radiographic studies, pulse oximetry and re-evaluation of patient's condition.   FINAL CLINICAL IMPRESSION(S) / ED DIAGNOSES   Ischemic right foot    Note:  This document was prepared using Dragon voice recognition software and may include unintentional dictation errors.   Minna Antis, MD 05/19/23 1744

## 2023-05-19 NOTE — ED Notes (Signed)
Pt states "Take this sticker off me" referring to her Pulse Ox. This tech provided education about the benefits and Risk. Pt continued to refuse. RN notified.

## 2023-05-19 NOTE — H&P (Signed)
History and Physical    Patient: Theresa [name] DOB: 10-Jun-1955 DOA: 05/19/2023 DOS: the patient was seen and examined on 05/19/2023 PCP: Dana Allan, MD  Patient coming from: Group home  Chief Complaint: Right foot gangrene Chief Complaint  Patient presents with   Wound Infection   HPI: Theresa [name] is a 67 y.o. female with medical history significant of  Anemia of chronic disease, COPD coronary artery disease status post stent placement, Diabetes mellitus with diabetic neuropathy and retinopathy, previous history of ESRD on hemodialysis currently status post transplant, hyperlipidemia, Hypertension moderate to severe mitral regurgitation, peripheral vascular disease who is a resident in a group home brought in on account of worsening gangrene noted on the right foot.  History obtained from patient as well as granddaughter present at bedside.  According to the granddaughter patient was on hospice at the group home however just last  Thursday she took herself off hospice.  According to her she has been having skin discoloration involving the right foot for some time now.  She denied associated pain.  She denied fever, nausea vomiting, cough, abdominal pain, urinary complaint or pain involving left foot.  ED course: Temperature 98.5 respiratory rate 16 pulse 80 blood pressure 129/39 saturating 98% on room air.   Upon arrival to the emergency room ED physician discussed with vascular surgeon Dr. Wyn Quaker who recommended admission for angiogram tomorrow with subsequent amputation.  Dr. Wyn Quaker also recommended placing patient on heparin drip.  Hospitalist service was therefore contacted to admit patient for further management  Review of Systems: As mentioned in the history of present illness. All other systems reviewed and are negative. Past Medical History:  Diagnosis Date   Anemia of chronic disease    Anginal pain (HCC)    Breast calcification seen on mammogram 04/08/2021   Carotid  arterial disease (HCC)    a. 02/2013 U/S: 40-59% bilat ICA stenosis.   Chronic constipation    Chronic diastolic CHF (congestive heart failure) (HCC)    a. 10/2013 Echo [facility]): EF 55-60%, mod conc LVH, mod MR, mildly dil LA, mild Ao sclerosis w/o stenosis; b. 02/2016 Echo: EF 55-60%, no rwma, Gr DD, mild AS, mod to sev MR, mildly dil LA, PASP ; c. 07/2017 Echo [facility]): EF >55%, mild to mod LVH, Gr2 DD, Ao scl, Mod MR, mod dil LA, nl RV fxn.   Colon polyps    COPD (chronic obstructive pulmonary disease) (HCC)    Coronary artery disease    a. 05/2013 NSTEMI/PCI: LM 20d, LAD min irregs, LCX small, nl, OM1 nl, RCA dom 23m (2.5x16 Promus DES), PDA1 80p; b. 04/2015 Cath: LM nl, LAD 51m, D1/2 min irregs, LCX 35p/m, OM2/3 min irregs, RCA patent mid stent, RPDA 70ost, RPLB1 30, RPLB2/3 min irregs, EF 55-65%--> Med Rx; c. 06/2017 [facility]): No ischemia/infarct, EF 64%.   Coronary artery disease of native artery of native heart with stable angina pectoris (HCC) 06/27/2013   S/p drug eluting stent RCA 05/2013   COVID-19    06/2021   Diabetes mellitus    Diabetic neuropathy (HCC)    Diabetic retinopathy (HCC) 05/28/2013   Hx bilat retinal detachment, proliferative diab retinopathy and bilat vitreous hemorrhage    Emphysema    ESRD on hemodialysis (HCC)    a. [facility] in [place], [place] [phone]/Dr. [name], on a MWF schedule.  She started dialysis in Feb 2014.  Etiology of renal failure not known, likely diabetes.  Has a left upper arm AV graft.  History of bronchitis    Mar 2012   History of heart attack 01/05/2018   History of pneumonia    June 2012   History of tobacco abuse    a. Quit 2012.   Hypercalcemia 02/27/2020   Hyperkalemia 04/18/2020   Hyperlipidemia    Hypertension    Hyponatremia 05/22/2021   Moderate to severe mitral insufficiency    a. 10/2013 Echo: EF 55-60%, mod MR; b. 02/2016 Echo: EF 55-60%, mod to sev MR directed posteriorly--felt to be dynamic -worse with volume overload; c.  07/2017 Echo [facility]): EF >55%. Mod MR.   Myocardial infarct [facility]) 05/2013   Neuritis or radiculitis due to rupture of lumbar intervertebral disc 11/28/2014   Peripheral vascular disease (HCC)    Sickle cell trait (HCC)    Thyroid nodule    [place] 06/2017 due to f/u US in 1 year    Valvular heart disease    echo 06/17/21 mild to moderate   Past Surgical History:  Procedure Laterality Date   A/V FISTULAGRAM Left 11/10/2017   Procedure: A/V FISTULAGRAM;  Surgeon: Renford Dills, MD;  Location: [facility] INVASIVE CV LAB;  Service: Cardiovascular;  Laterality: Left;   A/V SHUNTOGRAM Left 02/08/2019   Procedure: A/V SHUNTOGRAM;  Surgeon: Renford Dills, MD;  Location: [facility] INVASIVE CV LAB;  Service: Cardiovascular;  Laterality: Left;   ABDOMINAL HYSTERECTOMY     2000 for cysts on ovaries total no abnormal pap   CARDIAC CATHETERIZATION     CARDIAC CATHETERIZATION N/A 05/02/2015   Procedure: Left Heart Cath and Coronary Angiography;  Surgeon: Iran Ouch, MD;  Location: [facility] INVASIVE CV LAB;  Service: Cardiovascular;  Laterality: N/A;   COLONOSCOPY WITH PROPOFOL N/A 07/08/2016   Procedure: COLONOSCOPY WITH PROPOFOL;  Surgeon: Wyline Mood, MD;  Location: [facility] ENDOSCOPY;  Service: Endoscopy;  Laterality: N/A;   COLONOSCOPY WITH PROPOFOL N/A 08/20/2017   Procedure: COLONOSCOPY WITH PROPOFOL;  Surgeon: Wyline Mood, MD;  Location: [facility] ENDOSCOPY;  Service: Gastroenterology;  Laterality: N/A;   COLONOSCOPY WITH PROPOFOL N/A 09/06/2021   Procedure: COLONOSCOPY WITH PROPOFOL;  Surgeon: Wyline Mood, MD;  Location: [facility] ENDOSCOPY;  Service: Gastroenterology;  Laterality: N/A;   colonscopy     CORONARY ANGIOPLASTY  05/28/2014   stent placement to the mid RCA   CYSTOURETHROSCOPY     [facility] urology with stent placement 11 or 05/2019 and stent removal 06/13/19    DILATION AND CURETTAGE OF UTERUS     several in the early 80's   ESOPHAGOGASTRODUODENOSCOPY     2012   ESOPHAGOGASTRODUODENOSCOPY N/A 09/06/2021    Procedure: ESOPHAGOGASTRODUODENOSCOPY (EGD);  Surgeon: Wyline Mood, MD;  Location: [facility] ENDOSCOPY;  Service: Gastroenterology;  Laterality: N/A;   ESOPHAGOGASTRODUODENOSCOPY (EGD) WITH PROPOFOL N/A 03/11/2018   Procedure: ESOPHAGOGASTRODUODENOSCOPY (EGD) WITH PROPOFOL;  Surgeon: Wyline Mood, MD;  Location: [facility] ENDOSCOPY;  Service: Gastroenterology;  Laterality: N/A;   EYE SURGERY     bilateral laser 2012   EYE SURGERY     right   EYE SURGERY     x4 both eyes   GAS INSERTION  09/30/2011   Procedure: INSERTION OF GAS;  Surgeon: Sherrie George, MD;  Location: [facility] OR;  Service: Ophthalmology;  Laterality: Right;  [id number]   GAS/FLUID EXCHANGE  09/30/2011   Procedure: GAS/FLUID EXCHANGE;  Surgeon: Sherrie George, MD;  Location: [facility] OR;  Service: Ophthalmology;  Laterality: Right;   KIDNEY TRANSPLANT     03/30/19 Dr. Vernona Rieger Thomas/Dr. Trinna Post Zendel/Dr. Sundra Aland; [facility] transplant Dr. Clelia Croft will  be primary [phone] coordinator Philis Nettle [phone]   KIDNEY TRANSPLANT     03/30/19 [facility]   LEFT HEART CATHETERIZATION WITH CORONARY ANGIOGRAM N/A 05/28/2013   Procedure: LEFT HEART CATHETERIZATION WITH CORONARY ANGIOGRAM;  Surgeon: Corky Crafts, MD;  Location: [facility] CATH LAB;  Service: Cardiovascular;  Laterality: N/A;   PARS PLANA VITRECTOMY  04/22/2011   Procedure: PARS PLANA VITRECTOMY WITH 25 GAUGE;  Surgeon: Sherrie George, MD;  Location: [facility] OR;  Service: Ophthalmology;  Laterality: Left;  membrane peel, endolaser, gas fluid exchange, silicone oil, repair of complex traction retinal detachment   PARS PLANA VITRECTOMY  09/30/2011   Procedure: PARS PLANA VITRECTOMY WITH 25 GAUGE;  Surgeon: Sherrie George, MD;  Location: [facility] OR;  Service: Ophthalmology;  Laterality: Right;  Endolaser; Repair of Complex Traction Retinal Detachment   PARS PLANA VITRECTOMY  02/24/2012   Procedure: PARS PLANA VITRECTOMY WITH 25 GAUGE;  Surgeon: Sherrie George, MD;  Location: [facility] OR;  Service: Ophthalmology;   Laterality: Left;   PTCA     SILICON OIL REMOVAL  02/24/2012   Procedure: SILICON OIL REMOVAL;  Surgeon: Sherrie George, MD;  Location: [facility] OR;  Service: Ophthalmology;  Laterality: Left;   THROMBECTOMY / ARTERIOVENOUS GRAFT REVISION     TUBAL LIGATION     1979   UPPER EXTREMITY ANGIOGRAPHY Left 02/08/2019   Procedure: UPPER EXTREMITY ANGIOGRAPHY;  Surgeon: Renford Dills, MD;  Location: [facility] INVASIVE CV LAB;  Service: Cardiovascular;  Laterality: Left;   Social History:  reports that she quit smoking about 4 years ago. Her smoking use included cigarettes. She started smoking about 29 years ago. She has a 25 pack-year smoking history. She has never used smokeless tobacco. She reports current drug use. Drug: Marijuana. She reports that she does not drink alcohol.  Allergies  Allergen Reactions   Hydrocodone Hives   Lisinopril     Unknown reaction     Family History  Problem Relation Age of Onset   Stroke Mother    Heart attack Mother    Heart disease Mother    Colon cancer Father    Colon cancer Sister    Breast cancer Sister    Heart attack Brother    Stroke Brother    Diabetes Brother    Diabetes Brother    Diabetes Daughter    Renal Disease Daughter        on HD   Kidney failure Daughter        age 57 as of 12/03/20   Anesthesia problems Neg Hx    Hypotension Neg Hx    Malignant hyperthermia Neg Hx    Pseudochol deficiency Neg Hx     Prior to Admission medications   Medication Sig Start Date End Date Taking? Authorizing Provider  acetaminophen (TYLENOL) 500 MG tablet Take 1,000 mg by mouth daily as needed. 03/31/19   [provider]  albuterol (PROVENTIL) (2.5 MG/3ML) 0.083% nebulizer solution Take 3 mLs (2.5 mg total) by nebulization every 6 (six) hours as needed for wheezing or shortness of breath. 12/31/22   Dana Allan, MD  atorvastatin (LIPITOR) 20 MG tablet Take 1 tablet (20 mg total) by mouth daily. 04/22/22   Antonieta Iba, MD  cloNIDine  (CATAPRES) 0.1 MG tablet TAKE 1 TABLET BY MOUTH TWICE DAILY (MAY  TAKE  AN  EXTRA  ONE  TABLET  FOR  BLOOD  PRESSURE  OVER  160) 12/17/22   Gollan, Tollie Pizza, MD  ENVARSUS XR 1 MG TB24 Take 3 mg by mouth daily as needed. Total dose is 9 mg every morning at 0900. 01/18/21   [provider]  esomeprazole (NEXIUM) 20 MG capsule Take 20 mg by mouth daily. 04/16/20   [provider]  isosorbide mononitrate (IMDUR) 30 MG 24 hr tablet Take 1 tablet by mouth once daily 10/17/22   Antonieta Iba, MD  nitroGLYCERIN (NITROSTAT) 0.4 MG SL tablet Place 1 tablet (0.4 mg total) under the tongue every 5 (five) minutes as needed for chest pain. 04/27/20   Antonieta Iba, MD  pregabalin (LYRICA) 75 MG capsule Take 75 mg by mouth 2 (two) times daily.    Detwiler, Anabel Bene, MD  sodium bicarbonate 650 MG tablet Take 650 mg by mouth 2 (two) times daily.    [provider]  Sodium Zirconium Cyclosilicate (LOKELMA PO) Take by mouth in the morning and at bedtime. Patient not taking: Reported on 12/17/2022    [provider]  tacrolimus ER (ENVARSUS XR) 1 MG TB24 Take 1 mg by mouth daily. Patient not taking: Reported on 12/31/2022 06/28/21   [provider]  tacrolimus ER (ENVARSUS XR) 4 MG TB24 Take 8 mg by mouth daily before breakfast. Total dose is 9 mg daily in the morning.    [provider]  tacrolimus ER (ENVARSUS XR) 4 MG TB24 Take 8 mg by mouth daily. Patient not taking: Reported on 12/31/2022 06/28/21   [provider]    Physical Exam:  Constitutional:      Appearance: She is normal weight.  HENT:     Head: Normocephalic.     Nose: Nose normal.     Mouth/Throat:     Mouth: Mucous membranes are moist.  Eyes:     Pupils: Pupils are equal, round, and reactive to light.  Cardiovascular:     Rate and Rhythm: Normal rate and regular rhythm.  Pulmonary: Bilaterally decreased air entry Abdominal:     General: Bowel sounds are normal.   Musculoskeletal:    Generalized weakness, extensive gangrene involving all the right foot to the ankle   Skin:    General: Skin is warm.  Neurological:     General: No focal deficit present.  Psychiatric:        Mood and Affect: Mood normal.    Vitals:   05/19/23 1643 05/19/23 1645 05/19/23 1658 05/19/23 1700  BP:   (!) 161/75 (!) 169/40  Pulse:  83  88  Resp:      Temp:      TempSrc:      SpO2:  100%  100%  Weight: 66.2 kg     Height: 5\' 7"  (1.702 m)       Data Reviewed:    Latest Ref Rng & Units 05/19/2023    3:00 PM 06/07/2021    4:14 PM 05/28/2021    3:37 AM  CBC  WBC 4.0 - 10.5 K/uL 5.6  6.5  8.9   Hemoglobin 12.0 - 15.0 g/dL 9.3  8.9  8.6   Hematocrit 36.0 - 46.0 % 28.5  26.3  26.5   Platelets 150 - 400 K/uL 136  178  186        Latest Ref Rng & Units 05/19/2023    3:00 PM 06/07/2021    4:14 PM 05/28/2021    3:37 AM  CMP  Glucose 70 - 99 mg/dL 756  84  433   BUN 8 - 23 mg/dL 22  14  10   Creatinine 0.44 - 1.00 mg/dL 4.16  6.06  3.01   Sodium 135 - 145 mmol/L 132  129  135   Potassium 3.5 - 5.1 mmol/L 3.1  5.0  5.2   Chloride 98 - 111 mmol/L 94  103  109   CO2 22 - 32 mmol/L 26  20  22    Calcium 8.9 - 10.3 mg/dL 8.6  8.6  9.2   Total Protein 6.5 - 8.1 g/dL 6.2     Total Bilirubin <1.2 mg/dL 0.9     Alkaline Phos 38 - 126 U/L 154     AST 15 - 41 U/L 36     ALT 0 - 44 U/L 14       Foot x-ray showing findings of moderate to severe 1st through 5th interphalangeal joint osteoarthritis, mild attenuation of the forefoot soft tissues especially around more of the toes.  Assessment and Plan:  Right foot gangrene No signs of infection identified No indication for antibiotic therapy Continue heparin drip Pharmacist on board for dosing Vascular surgeon Dr. Wyn Quaker consulted and planning angiography tomorrow with decision for amputation day after We will keep n.p.o. after midnight  Hypokalemia Presented with potassium 3.1 Continue repletion and  monitoring  Hypovolemic hyponatremia Patient appears clinically dehydrated Encourage oral hydration  Anemia of chronic disease  COPD Currently not in acute exacerbation Placed on as needed nebulization  Coronary artery disease status post stent placement Continue statin therapy  Diabetes mellitus with diabetic neuropathy and retinopathy Monitor glucose level Placed on sliding scale insulin therapy for now Continue Lyrica  History of ESRD  Patient used to be on on hemodialysis currently status post transplant No longer on hemodialysis Renal function currently at baseline Resumed home dose of tacrolimus I will consult nephrology given history of transplants with rejection medication  Severe hypoalbuminemia Dietitian consulted  Hyperlipidemia Hypertension moderate to severe mitral regurgitation Peripheral vascular disease   Advance Care Planning:   Code Status: Prior DNI DNR this was discussed with patient  Consults: Vascular surgery  Family Communication: Discussed with granddaughter present at bedside  Severity of Illness: The appropriate patient status for this patient is INPATIENT. Inpatient status is judged to be reasonable and necessary in order to provide the required intensity of service to ensure the patient's safety. The patient's presenting symptoms, physical exam findings, and initial radiographic and laboratory data in the context of their chronic comorbidities is felt to place them at high risk for further clinical deterioration. Furthermore, it is not anticipated that the patient will be medically stable for discharge from the hospital within 2 midnights of admission.   * I certify that at the point of admission it is my clinical judgment that the patient will require inpatient hospital care spanning beyond 2 midnights from the point of admission due to high intensity of service, high risk for further deterioration and high frequency of surveillance  required.*  Author: Loyce Dys, MD 05/19/2023 5:34 PM  For on call review [email or web address].

## 2023-05-19 NOTE — ED Provider Triage Note (Signed)
Emergency Medicine Provider Triage Evaluation Note  Theresa [name] , a 67 y.o. female  was evaluated in triage.  Pt complains of foot infection. Patient unable to provide history and no family is present.  Review of patient's chart shows she is a patient of [facility] kidney transplant. Patient was on hospice but does not want to be in hospice care anymore. She wants to start dialysis and is ammenable to have her foot amputated. She was advised to present to the ED at [facility] but due to transportation issues was told to come here so she could then be transferred,.   Review of Systems  Positive: Left foot pain Negative:   Physical Exam  There were no vitals taken for this visit. Gen:   Awake, no distress   Resp:  Normal effort  MSK:   Moves extremities without difficulty  Other:  Entire left foot is necrotic, was able to palpate a weak posterior tibialis pulse, area of necrotic tissue extending up the lateral calf likely due to a pressure ulcer  Medical Decision Making  Medically screening exam initiated at 1:40 PM.  Appropriate orders placed.  [name] was informed that the remainder of the evaluation will be completed by another provider, this initial triage assessment does not replace that evaluation, and the importance of remaining in the ED until their evaluation is complete.     Cameron Ali, PA-C 05/19/23 1340

## 2023-05-19 NOTE — Consult Note (Signed)
Pharmacy Consult Note - Anticoagulation  Pharmacy Consult for heparin Indication:  limb ischemia  PATIENT MEASUREMENTS: Height: 5\' 7"  (170.2 cm) IBW/kg (Calculated) : 61.6    VITAL SIGNS: Temp: 98.5 F (36.9 C) (12/10 1340) Temp Source: Oral (12/10 1340) BP: 129/39 (12/10 1340) Pulse Rate: 80 (12/10 1340)  Recent Labs    05/19/23 1500  HGB 9.3*  HCT 28.5*  PLT 136*  CREATININE 1.56*    CrCl cannot be calculated (Unknown ideal weight.).  PAST MEDICAL HISTORY: Past Medical History:  Diagnosis Date   Anemia of chronic disease    Anginal pain (HCC)    Breast calcification seen on mammogram 04/08/2021   Carotid arterial disease (HCC)    a. 02/2013 U/S: 40-59% bilat ICA stenosis.   Chronic constipation    Chronic diastolic CHF (congestive heart failure) (HCC)    a. 10/2013 Echo [facility]): EF 55-60%, mod conc LVH, mod MR, mildly dil LA, mild Ao sclerosis w/o stenosis; b. 02/2016 Echo: EF 55-60%, no rwma, Gr DD, mild AS, mod to sev MR, mildly dil LA, PASP ; c. 07/2017 Echo [facility]): EF >55%, mild to mod LVH, Gr2 DD, Ao scl, Mod MR, mod dil LA, nl RV fxn.   Colon polyps    COPD (chronic obstructive pulmonary disease) (HCC)    Coronary artery disease    a. 05/2013 NSTEMI/PCI: LM 20d, LAD min irregs, LCX small, nl, OM1 nl, RCA dom 50m (2.5x16 Promus DES), PDA1 80p; b. 04/2015 Cath: LM nl, LAD 88m, D1/2 min irregs, LCX 35p/m, OM2/3 min irregs, RCA patent mid stent, RPDA 70ost, RPLB1 30, RPLB2/3 min irregs, EF 55-65%--> Med Rx; c. 06/2017 [facility]): No ischemia/infarct, EF 64%.   Coronary artery disease of native artery of native heart with stable angina pectoris (HCC) 06/27/2013   S/p drug eluting stent RCA 05/2013   COVID-19    06/2021   Diabetes mellitus    Diabetic neuropathy (HCC)    Diabetic retinopathy (HCC) 05/28/2013   Hx bilat retinal detachment, proliferative diab retinopathy and bilat vitreous hemorrhage    Emphysema    ESRD on hemodialysis (HCC)    a. [facility] in  [place], [place] [phone]/Dr. [name], on a MWF schedule.  She started dialysis in Feb 2014.  Etiology of renal failure not known, likely diabetes.  Has a left upper arm AV graft.   History of bronchitis    Mar 2012   History of heart attack 01/05/2018   History of pneumonia    June 2012   History of tobacco abuse    a. Quit 2012.   Hypercalcemia 02/27/2020   Hyperkalemia 04/18/2020   Hyperlipidemia    Hypertension    Hyponatremia 05/22/2021   Moderate to severe mitral insufficiency    a. 10/2013 Echo: EF 55-60%, mod MR; b. 02/2016 Echo: EF 55-60%, mod to sev MR directed posteriorly--felt to be dynamic -worse with volume overload; c. 07/2017 Echo [facility]): EF >55%. Mod MR.   Myocardial infarct [facility]) 05/2013   Neuritis or radiculitis due to rupture of lumbar intervertebral disc 11/28/2014   Peripheral vascular disease (HCC)    Sickle cell trait (HCC)    Thyroid nodule    [place] 06/2017 due to f/u US in 1 year    Valvular heart disease    echo 06/17/21 mild to moderate    ASSESSMENT: 67 y.o. female with PMH including T2DM, CAD, PAD, ESRD s/p DDKTP is presenting with limb ischemia. Patient is not on chronic anticoagulation per chart review. Pharmacy has been consulted  to initiate and manage heparin intravenous infusion.  Pertinent medications: No chronic anticoagulation prior to admission per chart review  Goal(s) of therapy: Heparin level 0.3 - 0.7 units/mL Monitor platelets by anticoagulation protocol: Yes   Baseline anticoagulation labs: Recent Labs    05/19/23 1500  HGB 9.3*  PLT 136*    Date Time aPTT/HL Rate/Comment     PLAN: Give 4600 units bolus x1; then start heparin infusion at 1000 units/hour. Check heparin level in 8 hours, then daily once at least two levels are consecutively therapeutic. Monitor CBC daily while on heparin infusion.  Will M. Dareen Piano, PharmD Clinical Pharmacist 05/19/2023 4:40 PM

## 2023-05-19 NOTE — ED Notes (Signed)
Pt is c/o pain. Dr. Meriam Sprague has been notified.

## 2023-05-20 ENCOUNTER — Encounter: Admission: EM | Disposition: E | Payer: Self-pay | Source: Home / Self Care | Attending: Student

## 2023-05-20 DIAGNOSIS — I708 Atherosclerosis of other arteries: Secondary | ICD-10-CM | POA: Diagnosis not present

## 2023-05-20 DIAGNOSIS — E876 Hypokalemia: Secondary | ICD-10-CM | POA: Diagnosis not present

## 2023-05-20 DIAGNOSIS — N1832 Chronic kidney disease, stage 3b: Secondary | ICD-10-CM | POA: Diagnosis not present

## 2023-05-20 DIAGNOSIS — E1152 Type 2 diabetes mellitus with diabetic peripheral angiopathy with gangrene: Secondary | ICD-10-CM | POA: Diagnosis not present

## 2023-05-20 DIAGNOSIS — I70261 Atherosclerosis of native arteries of extremities with gangrene, right leg: Secondary | ICD-10-CM

## 2023-05-20 DIAGNOSIS — I7090 Unspecified atherosclerosis: Principal | ICD-10-CM

## 2023-05-20 DIAGNOSIS — I96 Gangrene, not elsewhere classified: Secondary | ICD-10-CM | POA: Diagnosis not present

## 2023-05-20 DIAGNOSIS — Z940 Kidney transplant status: Secondary | ICD-10-CM | POA: Diagnosis not present

## 2023-05-20 DIAGNOSIS — E871 Hypo-osmolality and hyponatremia: Secondary | ICD-10-CM | POA: Diagnosis not present

## 2023-05-20 DIAGNOSIS — E11621 Type 2 diabetes mellitus with foot ulcer: Secondary | ICD-10-CM | POA: Diagnosis not present

## 2023-05-20 DIAGNOSIS — Z9889 Other specified postprocedural states: Secondary | ICD-10-CM

## 2023-05-20 DIAGNOSIS — Z95828 Presence of other vascular implants and grafts: Secondary | ICD-10-CM | POA: Diagnosis not present

## 2023-05-20 DIAGNOSIS — L97509 Non-pressure chronic ulcer of other part of unspecified foot with unspecified severity: Secondary | ICD-10-CM | POA: Diagnosis not present

## 2023-05-20 HISTORY — PX: LOWER EXTREMITY ANGIOGRAPHY: CATH118251

## 2023-05-20 LAB — CBC
HCT: 22.8 % — ABNORMAL LOW (ref 36.0–46.0)
HCT: 26.1 % — ABNORMAL LOW (ref 36.0–46.0)
Hemoglobin: 7.5 g/dL — ABNORMAL LOW (ref 12.0–15.0)
Hemoglobin: 8.3 g/dL — ABNORMAL LOW (ref 12.0–15.0)
MCH: 32.8 pg (ref 26.0–34.0)
MCH: 32.2 pg (ref 26.0–34.0)
MCHC: 32.9 g/dL (ref 30.0–36.0)
MCHC: 31.8 g/dL (ref 30.0–36.0)
MCV: 99.6 fL (ref 80.0–100.0)
MCV: 101.2 fL — ABNORMAL HIGH (ref 80.0–100.0)
Platelets: 106 10*3/uL — ABNORMAL LOW (ref 150–400)
Platelets: 117 10*3/uL — ABNORMAL LOW (ref 150–400)
RBC: 2.29 MIL/uL — ABNORMAL LOW (ref 3.87–5.11)
RBC: 2.58 MIL/uL — ABNORMAL LOW (ref 3.87–5.11)
RDW: 17.2 % — ABNORMAL HIGH (ref 11.5–15.5)
RDW: 17.5 % — ABNORMAL HIGH (ref 11.5–15.5)
WBC: 4 10*3/uL (ref 4.0–10.5)
WBC: 4.3 10*3/uL (ref 4.0–10.5)
nRBC: 0 % (ref 0.0–0.2)
nRBC: 0 % (ref 0.0–0.2)

## 2023-05-20 LAB — COMPREHENSIVE METABOLIC PANEL
ALT: 13 U/L (ref 0–44)
AST: 27 U/L (ref 15–41)
Albumin: 1.6 g/dL — ABNORMAL LOW (ref 3.5–5.0)
Alkaline Phosphatase: 127 U/L — ABNORMAL HIGH (ref 38–126)
Anion gap: 10 (ref 5–15)
BUN: 24 mg/dL — ABNORMAL HIGH (ref 8–23)
CO2: 25 mmol/L (ref 22–32)
Calcium: 8.2 mg/dL — ABNORMAL LOW (ref 8.9–10.3)
Chloride: 96 mmol/L — ABNORMAL LOW (ref 98–111)
Creatinine, Ser: 1.41 mg/dL — ABNORMAL HIGH (ref 0.44–1.00)
GFR, Estimated: 41 mL/min — ABNORMAL LOW (ref >60)
Glucose, Bld: 135 mg/dL — ABNORMAL HIGH (ref 70–99)
Potassium: 3.4 mmol/L — ABNORMAL LOW (ref 3.5–5.1)
Sodium: 131 mmol/L — ABNORMAL LOW (ref 135–145)
Total Bilirubin: 0.6 mg/dL (ref <1.2)
Total Protein: 5.3 g/dL — ABNORMAL LOW (ref 6.5–8.1)

## 2023-05-20 LAB — HIV ANTIBODY (ROUTINE TESTING W REFLEX): HIV Screen 4th Generation wRfx: NONREACTIVE

## 2023-05-20 LAB — HEMOGLOBIN A1C
Hgb A1c MFr Bld: 4.9 % (ref 4.8–5.6)
Mean Plasma Glucose: 93.93 mg/dL

## 2023-05-20 LAB — GLUCOSE, CAPILLARY
Glucose-Capillary: 112 mg/dL — ABNORMAL HIGH (ref 70–99)
Glucose-Capillary: 133 mg/dL — ABNORMAL HIGH (ref 70–99)
Glucose-Capillary: 81 mg/dL (ref 70–99)

## 2023-05-20 LAB — HEPARIN LEVEL (UNFRACTIONATED)
Heparin Unfractionated: 0.4 [IU]/mL (ref 0.30–0.70)
Heparin Unfractionated: 0.76 [IU]/mL — ABNORMAL HIGH (ref 0.30–0.70)

## 2023-05-20 LAB — CBG MONITORING, ED
Glucose-Capillary: 108 mg/dL — ABNORMAL HIGH (ref 70–99)
Glucose-Capillary: 102 mg/dL — ABNORMAL HIGH (ref 70–99)

## 2023-05-20 SURGERY — LOWER EXTREMITY ANGIOGRAPHY
Anesthesia: Moderate Sedation | Laterality: Right

## 2023-05-20 MED ORDER — IODIXANOL 320 MG/ML IV SOLN
INTRAVENOUS | Status: DC | PRN
  Administered 2023-05-20: 35 mL

## 2023-05-20 MED ORDER — FENTANYL CITRATE (PF) 100 MCG/2ML IJ SOLN
INTRAMUSCULAR | Status: AC
  Filled 2023-05-20: qty 2

## 2023-05-20 MED ORDER — HEPARIN (PORCINE) IN NACL 1000-0.9 UT/500ML-% IV SOLN
INTRAVENOUS | Status: DC | PRN
  Administered 2023-05-20: 1000 mL

## 2023-05-20 MED ORDER — AZATHIOPRINE 50 MG PO TABS
100.0000 mg | ORAL_TABLET | Freq: Every day | ORAL | Status: DC
  Administered 2023-05-20 – 2023-06-07 (×17): 100 mg via ORAL
  Filled 2023-05-20 (×19): qty 2

## 2023-05-20 MED ORDER — DIPHENHYDRAMINE HCL 50 MG/ML IJ SOLN: 50.0000 mg | Freq: Once | INTRAMUSCULAR | Status: DC | PRN

## 2023-05-20 MED ORDER — FAMOTIDINE 20 MG PO TABS: 40.0000 mg | ORAL_TABLET | Freq: Once | ORAL | Status: DC | PRN

## 2023-05-20 MED ORDER — MORPHINE SULFATE (PF) 2 MG/ML IV SOLN
2.0000 mg | Freq: Once | INTRAVENOUS | Status: AC
  Administered 2023-05-20: 2 mg via INTRAVENOUS
  Filled 2023-05-20: qty 1

## 2023-05-20 MED ORDER — HEPARIN SODIUM (PORCINE) 1000 UNIT/ML IJ SOLN
INTRAMUSCULAR | Status: DC | PRN
  Administered 2023-05-20: 4000 [IU] via INTRAVENOUS

## 2023-05-20 MED ORDER — HEPARIN SODIUM (PORCINE) 1000 UNIT/ML IJ SOLN
INTRAMUSCULAR | Status: AC
  Filled 2023-05-20: qty 10

## 2023-05-20 MED ORDER — FENTANYL CITRATE (PF) 100 MCG/2ML IJ SOLN: 12.5000 ug | Freq: Once | INTRAMUSCULAR | Status: DC | PRN

## 2023-05-20 MED ORDER — HYDROMORPHONE HCL 1 MG/ML IJ SOLN: 1.0000 mg | Freq: Once | INTRAMUSCULAR | Status: AC

## 2023-05-20 MED ORDER — MIDAZOLAM HCL 2 MG/2ML IJ SOLN
INTRAMUSCULAR | Status: DC | PRN
  Administered 2023-05-20: 1 mg via INTRAVENOUS
  Administered 2023-05-20: .5 mg via INTRAVENOUS
  Administered 2023-05-20 (×2): 1 mg via INTRAVENOUS

## 2023-05-20 MED ORDER — LIDOCAINE-EPINEPHRINE (PF) 1 %-1:200000 IJ SOLN
INTRAMUSCULAR | Status: DC | PRN
  Administered 2023-05-20: 10 mL

## 2023-05-20 MED ORDER — HYDROMORPHONE HCL 1 MG/ML IJ SOLN
INTRAMUSCULAR | Status: AC
  Administered 2023-05-20: 1 mg via INTRAVENOUS
  Filled 2023-05-20: qty 1

## 2023-05-20 MED ORDER — TACROLIMUS ER 1 MG PO TB24
10.0000 mg | ORAL_TABLET | Freq: Every day | ORAL | Status: DC
  Administered 2023-05-21 – 2023-06-07 (×16): 10 mg via ORAL
  Filled 2023-05-20 (×18): qty 2

## 2023-05-20 MED ORDER — MIDAZOLAM HCL 5 MG/5ML IJ SOLN
INTRAMUSCULAR | Status: AC
  Filled 2023-05-20: qty 5

## 2023-05-20 MED ORDER — CEFAZOLIN SODIUM-DEXTROSE 2-4 GM/100ML-% IV SOLN
2.0000 g | INTRAVENOUS | Status: AC
  Administered 2023-05-20: 2 g via INTRAVENOUS

## 2023-05-20 MED ORDER — FENTANYL CITRATE (PF) 100 MCG/2ML IJ SOLN
INTRAMUSCULAR | Status: DC | PRN
  Administered 2023-05-20: 25 ug via INTRAVENOUS
  Administered 2023-05-20: 50 ug via INTRAVENOUS
  Administered 2023-05-20 (×2): 25 ug via INTRAVENOUS

## 2023-05-20 MED ORDER — POTASSIUM CHLORIDE CRYS ER 20 MEQ PO TBCR
40.0000 meq | EXTENDED_RELEASE_TABLET | Freq: Once | ORAL | Status: AC
  Administered 2023-05-20: 40 meq via ORAL
  Filled 2023-05-20: qty 2

## 2023-05-20 MED ORDER — METHYLPREDNISOLONE SODIUM SUCC 125 MG IJ SOLR: 125.0000 mg | Freq: Once | INTRAMUSCULAR | Status: DC | PRN

## 2023-05-20 MED ORDER — CEFAZOLIN SODIUM-DEXTROSE 2-4 GM/100ML-% IV SOLN
INTRAVENOUS | Status: AC
  Filled 2023-05-20: qty 100

## 2023-05-20 MED ORDER — SODIUM CHLORIDE 0.9 % IV SOLN: INTRAVENOUS | Status: DC

## 2023-05-20 MED ORDER — MIDAZOLAM HCL 2 MG/ML PO SYRP: 8.0000 mg | ORAL_SOLUTION | Freq: Once | ORAL | Status: DC | PRN

## 2023-05-20 SURGICAL SUPPLY — 15 items
BALLN LUTONIX 018 5X300X130 (BALLOONS) ×1
BALLOON LUTONIX 018 5X300X130 (BALLOONS) IMPLANT
CATH ANGIO 5F PIGTAIL 65CM (CATHETERS) IMPLANT
CATH BEACON 5 .038 100 VERT TP (CATHETERS) IMPLANT
COVER PROBE ULTRASOUND 5X96 (MISCELLANEOUS) IMPLANT
DEVICE PRESTO INFLATION (MISCELLANEOUS) IMPLANT
DEVICE STARCLOSE SE CLOSURE (Vascular Products) IMPLANT
GLIDEWIRE ADV .035X260CM (WIRE) IMPLANT
PACK ANGIOGRAPHY (CUSTOM PROCEDURE TRAY) ×1 IMPLANT
SHEATH ANL2 6FRX45 HC (SHEATH) IMPLANT
SHEATH BRITE TIP 5FRX11 (SHEATH) IMPLANT
SYR MEDRAD MARK 7 150ML (SYRINGE) IMPLANT
TUBING CONTRAST HIGH PRESS 72 (TUBING) IMPLANT
WIRE G V18X300CM (WIRE) IMPLANT
WIRE GUIDERIGHT .035X150 (WIRE) IMPLANT

## 2023-05-20 NOTE — Progress Notes (Signed)
Progress Note   Patient: Theresa [name]:[id number] DOB: April 10, 1956 DOA: 05/19/2023     1 DOS: the patient was seen and examined on 05/20/2023   Brief hospital course: HPI on admission: "KELSI GIAMPAOLO is a 67 y.o. female with medical history significant of  Anemia of chronic disease, COPD coronary artery disease status post stent placement, Diabetes mellitus with diabetic neuropathy and retinopathy, previous history of ESRD on hemodialysis currently status post transplant, hyperlipidemia, Hypertension moderate to severe mitral regurgitation, peripheral vascular disease who is a resident in a group home brought in on account of worsening gangrene noted on the right foot.  ..." See H&P for full HPI on admission & ED course.  Patient was admitted to the hospital, started on IV heparin and Vascular Surgery was consulted, plan for angiogram today (12/11).  Further hospital course and management as outlined below.   Assessment and Plan:  Right foot gangrene No signs of infection identified, no antibiotics indicated at this time --Continue heparin drip --Vascular surgery following --Dr. Wyn Quaker plans for angiogram today  --Resume diet after procedure --Follow up Vascular's recommendations   Hypokalemia - K 3.1 >> 3.4 K 3.1 on admission was replaced. --40 mEq PO K-Cl ordered --Monitor BMP, replaced K PRN --Check Mg level with AM labs   Hypovolemic hyponatremia - mild, Na 132 on admission.  Pt appears clinically dehydrated. --Encourage oral hydration --Monitor BMP   Anemia of chronic disease - Hbg stable --Monitor CBC   COPD - stable, not exacerbated --PRN bronchodilators   Coronary artery disease status post stent placement --Continue statin therapy   Diabetes mellitus with diabetic neuropathy and retinopathy --Monitor CBG's --Sliding scale Novolog --Continue Lyrica   History of ESRD  Patient used to be on on hemodialysis currently status post transplant No longer on  hemodialysis Renal function currently at baseline Resumed home dose of tacrolimus I will consult nephrology given history of transplants with rejection medication   Severe hypoalbuminemia Dietitian consulted   Hyperlipidemia Hypertension moderate to severe mitral regurgitation Peripheral vascular disease      Subjective: Pt seen in ED just before vascular procedure today.  She denies acute complaints, requesting to be repositioned in the bed.    Physical Exam: Vitals:   05/20/23 1415 05/20/23 1430 05/20/23 1445 05/20/23 1500  BP: (!) 144/69 (!) 128/51  (!) 125/51  Pulse: 88 83 82 77  Resp: 12 12 17 15   Temp:      TempSrc:      SpO2: 96% 99% 98% 96%  Weight:      Height:       General exam: awake, alert, no acute distress, chronically ill appearing HEENT: moist mucus membranes, hearing grossly normal  Respiratory system: CTAB, no wheezes, rales or rhonchi, normal respiratory effort. Cardiovascular system: normal S1/S2, RRR, no JVD, murmurs, rubs, gallops, no pedal edema.   Gastrointestinal system: soft, NT, ND, no HSM felt, +bowel sounds. Central nervous system: A&O x. no gross focal neurologic deficits, normal speech Extremities: right foot and distal RLE gangrene (images below), no edema, normal tone Psychiatry: normal mood, congruent affect, judgement and insight appear normal       Data Reviewed:  Notable labs --  Na 131, K 3.4, Cl 96, glucose 135, BUN 24, Cr 1.41 improved from 1.56, alk phos 127, albumin 1.6, Hbg 9.3 >> 7.5 >> 8.3, platelets 117k  Family Communication: None present on rounds  Disposition: Status is: Inpatient Remains inpatient appropriate because: vascular procedure today, remains on IV heparin  Planned Discharge Destination: return to prior home environment    Time spent: 45 minutes  Author: Pennie Banter, DO 05/20/2023 3:20 PM  For on call review [email or web address].

## 2023-05-20 NOTE — ED Notes (Signed)
Updated Granddaughter [name] about Pts care

## 2023-05-20 NOTE — Consult Note (Signed)
Pharmacy Consult Note - Anticoagulation  Pharmacy Consult for heparin Indication:  limb ischemia  PATIENT MEASUREMENTS: Height: 5\' 7"  (170.2 cm) Weight: 66.2 kg (146 lb) IBW/kg (Calculated) : 61.6 HEPARIN DW (KG): 66.2  VITAL SIGNS: Temp: 98.5 F (36.9 C) (12/11 0937) Temp Source: Oral (12/11 0937) BP: 147/44 (12/11 0721) Pulse Rate: 70 (12/11 0721)  Recent Labs    05/19/23 1500 05/19/23 1630 05/20/23 0051 05/20/23 0927  HGB  --   --  7.5* 8.3*  HCT  --   --  22.8* 26.1*  PLT  --   --  106* 117*  APTT  --  30  --   --   LABPROT  --  14.2  --   --   INR  --  1.1  --   --   HEPARINUNFRC   < >  --  0.40 0.76*  CREATININE  --   --  1.41*  --    < > = values in this interval not displayed.    Estimated Creatinine Clearance: 38.7 mL/min (A) (by C-G formula based on SCr of 1.41 mg/dL (H)).  PAST MEDICAL HISTORY: Past Medical History:  Diagnosis Date   Anemia of chronic disease    Anginal pain (HCC)    Breast calcification seen on mammogram 04/08/2021   Carotid arterial disease (HCC)    a. 02/2013 U/S: 40-59% bilat ICA stenosis.   Chronic constipation    Chronic diastolic CHF (congestive heart failure) (HCC)    a. 10/2013 Echo [facility]): EF 55-60%, mod conc LVH, mod MR, mildly dil LA, mild Ao sclerosis w/o stenosis; b. 02/2016 Echo: EF 55-60%, no rwma, Gr DD, mild AS, mod to sev MR, mildly dil LA, PASP ; c. 07/2017 Echo [facility]): EF >55%, mild to mod LVH, Gr2 DD, Ao scl, Mod MR, mod dil LA, nl RV fxn.   Colon polyps    COPD (chronic obstructive pulmonary disease) (HCC)    Coronary artery disease    a. 05/2013 NSTEMI/PCI: LM 20d, LAD min irregs, LCX small, nl, OM1 nl, RCA dom 77m (2.5x16 Promus DES), PDA1 80p; b. 04/2015 Cath: LM nl, LAD 68m, D1/2 min irregs, LCX 35p/m, OM2/3 min irregs, RCA patent mid stent, RPDA 70ost, RPLB1 30, RPLB2/3 min irregs, EF 55-65%--> Med Rx; c. 06/2017 MV [facility]): No ischemia/infarct, EF 64%.   Coronary artery disease of native artery of native heart  with stable angina pectoris (HCC) 06/27/2013   S/p drug eluting stent RCA 05/2013   COVID-19    06/2021   Diabetes mellitus    Diabetic neuropathy (HCC)    Diabetic retinopathy (HCC) 05/28/2013   Hx bilat retinal detachment, proliferative diab retinopathy and bilat vitreous hemorrhage    Emphysema    ESRD on hemodialysis (HCC)    a. [facility] in [place], [facility] [phone]/Dr. [name], on a MWF schedule.  She started dialysis in Feb 2014.  Etiology of renal failure not known, likely diabetes.  Has a left upper arm AV graft.   History of bronchitis    Mar 2012   History of heart attack 01/05/2018   History of pneumonia    June 2012   History of tobacco abuse    a. Quit 2012.   Hypercalcemia 02/27/2020   Hyperkalemia 04/18/2020   Hyperlipidemia    Hypertension    Hyponatremia 05/22/2021   Moderate to severe mitral insufficiency    a. 10/2013 Echo: EF 55-60%, mod MR; b. 02/2016 Echo: EF 55-60%, mod to sev MR directed posteriorly--felt to be  dynamic -worse with volume overload; c. 07/2017 Echo [facility]): EF >55%. Mod MR.   Myocardial infarct [facility]) 05/2013   Neuritis or radiculitis due to rupture of lumbar intervertebral disc 11/28/2014   Peripheral vascular disease (HCC)    Sickle cell trait (HCC)    Thyroid nodule    [place] 06/2017 due to f/u US in 1 year    Valvular heart disease    echo 06/17/21 mild to moderate    ASSESSMENT: 67 y.o. female with PMH including T2DM, CAD, PAD, ESRD s/p DDKTP is presenting with limb ischemia. Patient is not on chronic anticoagulation per chart review. Pharmacy has been consulted to initiate and manage heparin intravenous infusion.  Pertinent medications: No chronic anticoagulation prior to admission per chart review  Goal(s) of therapy: Heparin level 0.3 - 0.7 units/mL Monitor platelets by anticoagulation protocol: Yes   Baseline anticoagulation labs: Recent Labs    05/19/23 1500 05/19/23 1630 05/20/23 0051 05/20/23 0927  APTT  --  30  --   --   INR   --  1.1  --   --   HGB 9.3*  --  7.5* 8.3*  PLT 136*  --  106* 117*    Date Time aPTT/HL Rate/Comment 12/11 0051 HL 0.40 Therapeutic x 1 12/11 0927 HL 0.76 Supratherapeutic; 1000>900   PLAN: Reduce heparin infusion to 900 units/hr Recheck heparin level in 8 hours, then daily once at least two levels are consecutively therapeutic. HGB & Platelet trending down, will recheck CBC w/ HL then monitor CBC daily while on heparin infusion.  Will M. Dareen Piano, PharmD Clinical Pharmacist 05/20/2023 10:40 AM

## 2023-05-20 NOTE — ED Notes (Signed)
Iv team at bedside  

## 2023-05-20 NOTE — Consult Note (Signed)
[facility]  CONSULT NOTE    Date: 05/20/2023                  Patient Name:  Theresa [name]  MRN: [id number]  DOB: [birth year]  Age / Sex: 67 y.o., female         PCP: Dana Allan, MD                 Service Requesting Consult: [facility]                 Reason for Consult: Renal transplant            History of Present Illness: Theresa [name] is a 67 y.o.  female with past medical conditions including COPD, anemia, diabetes, CAD, hyperlipidemia, status post renal transplant, hypertension, who was admitted to [facility] on 05/19/2023 for Arterial occlusion [I70.90] Diabetes mellitus with foot ulcer and gangrene (HCC) [O13.086, L97.509, E11.52] Gangrene of right foot (HCC) [I96]  Patient is known to our practice and was followed by our team while receiving hemodialysis prior to renal transplant.  Patient received renal transplant on 03/30/2019 from a cadaver donor.  Baseline creatinine has remained between 1-1.5 since that time.  Patient was recently followed by hospice care in her group home.  Last Thursday, patient took herself off hospice.  Patient brought to emergency department for evaluation of gangrenous right foot.  Labs on ED arrival significant for sodium 132, potassium 3.1, glucose 134, creatinine 1.56 with GFR 37, albumin 1.8, and hemoglobin 9.3.  Right foot x-ray shows osteoarthritis and continuation of the forefoot soft tissue's, most notable around the toes.  Vascular surgery consulted.   Medications: Outpatient medications: Medications Prior to Admission  Medication Sig Dispense Refill Last Dose   acetaminophen (TYLENOL) 500 MG tablet Take 1,000 mg by mouth daily as needed.   unk   albuterol (PROVENTIL) (2.5 MG/3ML) 0.083% nebulizer solution Take 3 mLs (2.5 mg total) by nebulization every 6 (six) hours as needed for wheezing or shortness of breath. 75 mL 11 unk   atorvastatin (LIPITOR) 20 MG tablet Take 1 tablet (20 mg total) by mouth daily. 90 tablet 3  05/18/2023   cloNIDine (CATAPRES) 0.1 MG tablet TAKE 1 TABLET BY MOUTH TWICE DAILY (MAY  TAKE  AN  EXTRA  ONE  TABLET  FOR  BLOOD  PRESSURE  OVER  160) 200 tablet 0 05/18/2023   ENVARSUS XR 1 MG TB24 Take 3 mg by mouth daily.   05/18/2023   esomeprazole (NEXIUM) 20 MG capsule Take 20 mg by mouth daily.   05/18/2023   furosemide (LASIX) 40 MG tablet Take 80 mg by mouth 2 (two) times daily as needed for edema or fluid.   unk   lidocaine 4 % Place 1 patch onto the skin daily as needed.   unk   LORazepam (ATIVAN) 0.5 MG tablet Take 0.5 mg by mouth every 4 (four) hours as needed for sleep or anxiety.   unk   Morphine Sulfate (MORPHINE CONCENTRATE) 10 mg / 0.5 ml concentrated solution Take 10 mg by mouth as directed. Every 2 hours per hospice nurse   Past Week   nitroGLYCERIN (NITROSTAT) 0.4 MG SL tablet Place 1 tablet (0.4 mg total) under the tongue every 5 (five) minutes as needed for chest pain. 25 tablet 3 unk   oxyCODONE (OXY IR/ROXICODONE) 5 MG immediate release tablet Take 5 mg by mouth every 4 (four) hours as needed for moderate pain (pain score 4-6)  or severe pain (pain score 7-10).   unk   pregabalin (LYRICA) 75 MG capsule Take 75 mg by mouth 2 (two) times daily.   05/18/2023   sodium bicarbonate 650 MG tablet Take 650 mg by mouth 2 (two) times daily as needed for heartburn.   unk   isosorbide mononitrate (IMDUR) 30 MG 24 hr tablet Take 1 tablet by mouth once daily (Patient not taking: Reported on 05/19/2023) 90 tablet 0 Not Taking    Current medications: Current Facility-Administered Medications  Medication Dose Route Frequency Provider Last Rate Last Admin   0.9 %  sodium chloride infusion   Intravenous Continuous Pace, Brien R, NP 75 mL/hr at 05/20/23 1238 New Bag at 05/20/23 1238   [MAR Hold] atorvastatin (LIPITOR) tablet 20 mg  20 mg Oral Daily Rosezetta Schlatter T, MD   20 mg at 05/19/23 2109   ceFAZolin (ANCEF) IVPB 2g/100 mL premix  2 g Intravenous 30 min Pre-Op Rolla Plate R, NP 200 mL/hr at  05/20/23 1245 2 g at 05/20/23 1245   [MAR Hold] cloNIDine (CATAPRES) tablet 0.1 mg  0.1 mg Oral Daily Rosezetta Schlatter T, MD   0.1 mg at 05/19/23 2109   diphenhydrAMINE (BENADRYL) injection 50 mg  50 mg Intravenous Once PRN Pace, Brien R, NP       famotidine (PEPCID) tablet 40 mg  40 mg Oral Once PRN Pace, Brien R, NP       fentaNYL (SUBLIMAZE) injection 12.5 mcg  12.5 mcg Intravenous Once PRN Rolla Plate R, NP       fentaNYL (SUBLIMAZE) injection    PRN Annice Needy, MD   25 mcg at 05/20/23 1314   heparin ADULT infusion 100 units/mL (25000 units/223mL)  900 Units/hr Intravenous Continuous Orson Aloe, [facility]   Stopped at 05/20/23 1237   [MAR Hold] insulin aspart (novoLOG) injection 0-5 Units  0-5 Units Subcutaneous QHS Loyce Dys, MD       [facility] Hold] insulin aspart (novoLOG) injection 0-9 Units  0-9 Units Subcutaneous TID WC Loyce Dys, MD       [MAR Hold] ipratropium-albuterol (DUONEB) 0.5-2.5 (3) MG/3ML nebulizer solution 3 mL  3 mL Nebulization Q6H PRN Loyce Dys, MD       [MAR Hold] isosorbide mononitrate (IMDUR) 24 hr tablet 30 mg  30 mg Oral Daily Rosezetta Schlatter T, MD   30 mg at 05/20/23 1610   methylPREDNISolone sodium succinate (SOLU-MEDROL) 125 mg/2 mL injection 125 mg  125 mg Intravenous Once PRN Pace, Brien R, NP       midazolam (VERSED) 2 MG/ML syrup 8 mg  8 mg Oral Once PRN Marcie Bal, NP       midazolam (VERSED) injection    PRN Annice Needy, MD   1 mg at 05/20/23 1314   [MAR Hold] pantoprazole (PROTONIX) EC tablet 40 mg  40 mg Oral Daily Rosezetta Schlatter T, MD   40 mg at 05/20/23 0916   [MAR Hold] pregabalin (LYRICA) capsule 75 mg  75 mg Oral BID Rosezetta Schlatter T, MD   75 mg at 05/20/23 0916   [MAR Hold] tacrolimus ER (ENVARSUS XR) tablet 9 mg  9 mg Oral QAC breakfast Loyce Dys, MD          Allergies: Allergies  Allergen Reactions   Hydrocodone Hives   Ondansetron Anxiety and Other (See Comments)    Causes hallucinations    Lisinopril     Unknown reaction  Past Medical History: Past Medical History:  Diagnosis Date   Anemia of chronic disease    Anginal pain (HCC)    Breast calcification seen on mammogram 04/08/2021   Carotid arterial disease (HCC)    a. 02/2013 U/S: 40-59% bilat ICA stenosis.   Chronic constipation    Chronic diastolic CHF (congestive heart failure) (HCC)    a. 10/2013 Echo [facility]): EF 55-60%, mod conc LVH, mod MR, mildly dil LA, mild Ao sclerosis w/o stenosis; b. 02/2016 Echo: EF 55-60%, no rwma, Gr DD, mild AS, mod to sev MR, mildly dil LA, PASP ; c. 07/2017 Echo [facility]): EF >55%, mild to mod LVH, Gr2 DD, Ao scl, Mod MR, mod dil LA, nl RV fxn.   Colon polyps    COPD (chronic obstructive pulmonary disease) (HCC)    Coronary artery disease    a. 05/2013 NSTEMI/PCI: LM 20d, LAD min irregs, LCX small, nl, OM1 nl, RCA dom 80m (2.5x16 Promus DES), PDA1 80p; b. 04/2015 Cath: LM nl, LAD 62m, D1/2 min irregs, LCX 35p/m, OM2/3 min irregs, RCA patent mid stent, RPDA 70ost, RPLB1 30, RPLB2/3 min irregs, EF 55-65%--> Med Rx; c. 06/2017 MV [facility]): No ischemia/infarct, EF 64%.   Coronary artery disease of native artery of native heart with stable angina pectoris (HCC) 06/27/2013   S/p drug eluting stent RCA 05/2013   COVID-19    06/2021   Diabetes mellitus    Diabetic neuropathy (HCC)    Diabetic retinopathy (HCC) 05/28/2013   Hx bilat retinal detachment, proliferative diab retinopathy and bilat vitreous hemorrhage    Emphysema    ESRD on hemodialysis (HCC)    a. [facility] in [place], [place] [phone]/Dr. [name], on a MWF schedule.  She started dialysis in Feb 2014.  Etiology of renal failure not known, likely diabetes.  Has a left upper arm AV graft.   History of bronchitis    Mar 2012   History of heart attack 01/05/2018   History of pneumonia    June 2012   History of tobacco abuse    a. Quit 2012.   Hypercalcemia 02/27/2020   Hyperkalemia 04/18/2020   Hyperlipidemia    Hypertension    Hyponatremia 05/22/2021   Moderate  to severe mitral insufficiency    a. 10/2013 Echo: EF 55-60%, mod MR; b. 02/2016 Echo: EF 55-60%, mod to sev MR directed posteriorly--felt to be dynamic -worse with volume overload; c. 07/2017 Echo [facility]): EF >55%. Mod MR.   Myocardial infarct [facility]) 05/2013   Neuritis or radiculitis due to rupture of lumbar intervertebral disc 11/28/2014   Peripheral vascular disease (HCC)    Sickle cell trait (HCC)    Thyroid nodule    [place] 06/2017 due to f/u US in 1 year    Valvular heart disease    echo 06/17/21 mild to moderate     Past Surgical History: Past Surgical History:  Procedure Laterality Date   A/V FISTULAGRAM Left 11/10/2017   Procedure: A/V FISTULAGRAM;  Surgeon: Renford Dills, MD;  Location: [facility] INVASIVE CV LAB;  Service: Cardiovascular;  Laterality: Left;   A/V SHUNTOGRAM Left 02/08/2019   Procedure: A/V SHUNTOGRAM;  Surgeon: Renford Dills, MD;  Location: [facility] INVASIVE CV LAB;  Service: Cardiovascular;  Laterality: Left;   ABDOMINAL HYSTERECTOMY     2000 for cysts on ovaries total no abnormal pap   CARDIAC CATHETERIZATION     CARDIAC CATHETERIZATION N/A 05/02/2015   Procedure: Left Heart Cath and Coronary Angiography;  Surgeon: Iran Ouch, MD;  Location: [facility] INVASIVE CV LAB;  Service: Cardiovascular;  Laterality: N/A;   COLONOSCOPY WITH PROPOFOL N/A 07/08/2016   Procedure: COLONOSCOPY WITH PROPOFOL;  Surgeon: Wyline Mood, MD;  Location: [facility] ENDOSCOPY;  Service: Endoscopy;  Laterality: N/A;   COLONOSCOPY WITH PROPOFOL N/A 08/20/2017   Procedure: COLONOSCOPY WITH PROPOFOL;  Surgeon: Wyline Mood, MD;  Location: [facility] ENDOSCOPY;  Service: Gastroenterology;  Laterality: N/A;   COLONOSCOPY WITH PROPOFOL N/A 09/06/2021   Procedure: COLONOSCOPY WITH PROPOFOL;  Surgeon: Wyline Mood, MD;  Location: [facility] ENDOSCOPY;  Service: Gastroenterology;  Laterality: N/A;   colonscopy     CORONARY ANGIOPLASTY  05/28/2014   stent placement to the mid RCA   CYSTOURETHROSCOPY     [facility] urology with  stent placement 11 or 05/2019 and stent removal 06/13/19    DILATION AND CURETTAGE OF UTERUS     several in the early 80's   ESOPHAGOGASTRODUODENOSCOPY     2012   ESOPHAGOGASTRODUODENOSCOPY N/A 09/06/2021   Procedure: ESOPHAGOGASTRODUODENOSCOPY (EGD);  Surgeon: Wyline Mood, MD;  Location: [facility] ENDOSCOPY;  Service: Gastroenterology;  Laterality: N/A;   ESOPHAGOGASTRODUODENOSCOPY (EGD) WITH PROPOFOL N/A 03/11/2018   Procedure: ESOPHAGOGASTRODUODENOSCOPY (EGD) WITH PROPOFOL;  Surgeon: Wyline Mood, MD;  Location: [facility] ENDOSCOPY;  Service: Gastroenterology;  Laterality: N/A;   EYE SURGERY     bilateral laser 2012   EYE SURGERY     right   EYE SURGERY     x4 both eyes   GAS INSERTION  09/30/2011   Procedure: INSERTION OF GAS;  Surgeon: Sherrie George, MD;  Location: [facility] OR;  Service: Ophthalmology;  Laterality: Right;  [id number]   GAS/FLUID EXCHANGE  09/30/2011   Procedure: GAS/FLUID EXCHANGE;  Surgeon: Sherrie George, MD;  Location: [facility] OR;  Service: Ophthalmology;  Laterality: Right;   KIDNEY TRANSPLANT     03/30/19 Dr. Vernona Rieger Thomas/Dr. Trinna Post Zendel/Dr. Sundra Aland; [facility] transplant Dr. Clelia Croft will be primary [phone] coordinator Sarah Wynkoop [phone]   KIDNEY TRANSPLANT     03/30/19 [facility]   LEFT HEART CATHETERIZATION WITH CORONARY ANGIOGRAM N/A 05/28/2013   Procedure: LEFT HEART CATHETERIZATION WITH CORONARY ANGIOGRAM;  Surgeon: Corky Crafts, MD;  Location: [facility] CATH LAB;  Service: Cardiovascular;  Laterality: N/A;   PARS PLANA VITRECTOMY  04/22/2011   Procedure: PARS PLANA VITRECTOMY WITH 25 GAUGE;  Surgeon: Sherrie George, MD;  Location: [facility] OR;  Service: Ophthalmology;  Laterality: Left;  membrane peel, endolaser, gas fluid exchange, silicone oil, repair of complex traction retinal detachment   PARS PLANA VITRECTOMY  09/30/2011   Procedure: PARS PLANA VITRECTOMY WITH 25 GAUGE;  Surgeon: Sherrie George, MD;  Location: [facility] OR;  Service: Ophthalmology;  Laterality: Right;  Endolaser;  Repair of Complex Traction Retinal Detachment   PARS PLANA VITRECTOMY  02/24/2012   Procedure: PARS PLANA VITRECTOMY WITH 25 GAUGE;  Surgeon: Sherrie George, MD;  Location: [facility] OR;  Service: Ophthalmology;  Laterality: Left;   PTCA     SILICON OIL REMOVAL  02/24/2012   Procedure: SILICON OIL REMOVAL;  Surgeon: Sherrie George, MD;  Location: [facility] OR;  Service: Ophthalmology;  Laterality: Left;   THROMBECTOMY / ARTERIOVENOUS GRAFT REVISION     TUBAL LIGATION     1979   UPPER EXTREMITY ANGIOGRAPHY Left 02/08/2019   Procedure: UPPER EXTREMITY ANGIOGRAPHY;  Surgeon: Renford Dills, MD;  Location: [facility] INVASIVE CV LAB;  Service: Cardiovascular;  Laterality: Left;     Family History: Family History  Problem Relation Age of Onset  Stroke Mother    Heart attack Mother    Heart disease Mother    Colon cancer Father    Colon cancer Sister    Breast cancer Sister    Heart attack Brother    Stroke Brother    Diabetes Brother    Diabetes Brother    Diabetes Daughter    Renal Disease Daughter        on HD   Kidney failure Daughter        age 25 as of 12/03/20   Anesthesia problems Neg Hx    Hypotension Neg Hx    Malignant hyperthermia Neg Hx    Pseudochol deficiency Neg Hx      Social History: Social History   Socioeconomic History   Marital status: Single    Spouse name: Not on file   Number of children: 2   Years of education: Not on file   Highest education level: Not on file  Occupational History   Not on file  Tobacco Use   Smoking status: Former    Current packs/day: 0.00    Average packs/day: 1 pack/day for 25.0 years (25.0 ttl pk-yrs)    Types: Cigarettes    Start date: 03/29/1994    Quit date: 03/30/2019    Years since quitting: 4.1   Smokeless tobacco: Never  Vaping Use   Vaping status: Never Used  Substance and Sexual Activity   Alcohol use: No   Drug use: Yes    Types: Marijuana    Comment: daily   Sexual activity: Never  Other Topics Concern   Not  on file  Social History Narrative   On disability.    Lives with son Vaughan Basta   2 children (has son and daughter)      Son drives her since her vision has decreased   Social Determinants of Health   Financial Resource Strain: Low Risk  (02/13/2023)   Received from [facility]   Overall Financial Resource Strain (CARDIA)    Difficulty of Paying Living Expenses: Not hard at all  Food Insecurity: No Food Insecurity (02/13/2023)   Received from [facility]   Hunger Vital Sign    Worried About Running Out of Food in the Last Year: Never true    Ran Out of Food in the Last Year: Never true  Transportation Needs: No Transportation Needs (02/13/2023)   Received from [facility] - Transportation    Lack of Transportation (Medical): No    Lack of Transportation (Non-Medical): No  Physical Activity: Inactive (12/17/2022)   Exercise Vital Sign    Days of Exercise per Week: 0 days    Minutes of Exercise per Session: 0 min  Stress: No Stress Concern Present (12/17/2022)   [employer] of Occupational Health - Occupational Stress Questionnaire    Feeling of Stress : Not at all  Social Connections: Socially Isolated (12/17/2022)   Social Connection and Isolation Panel [NHANES]    Frequency of Communication with Friends and Family: More than three times a week    Frequency of Social Gatherings with Friends and Family: More than three times a week    Attends Religious Services: Never    Database administrator or Organizations: No    Attends Banker Meetings: Never    Marital Status: Widowed  Intimate Partner Violence: Not At Risk (12/17/2022)   Humiliation, Afraid, Rape, and Kick questionnaire    Fear of Current or Ex-Partner: No  Emotionally Abused: No    Physically Abused: No    Sexually Abused: No     Review of Systems: Review of Systems  Constitutional:  Negative for chills, fever and malaise/fatigue.  HENT:  Negative for congestion, sore throat  and tinnitus.   Eyes:  Negative for blurred vision and redness.  Respiratory:  Negative for cough, shortness of breath and wheezing.   Cardiovascular:  Negative for chest pain, palpitations, claudication and leg swelling.  Gastrointestinal:  Negative for abdominal pain, blood in stool, diarrhea, nausea and vomiting.  Genitourinary:  Negative for flank pain, frequency and hematuria.  Musculoskeletal:  Negative for back pain, falls and myalgias.  Skin:  Negative for rash.       Right foot gangrene  Neurological:  Negative for dizziness, weakness and headaches.  Endo/Heme/Allergies:  Does not bruise/bleed easily.  Psychiatric/Behavioral:  Negative for depression. The patient is not nervous/anxious and does not have insomnia.     Vital Signs: Blood pressure (!) 183/58, pulse 91, temperature 98.3 F (36.8 C), temperature source Oral, resp. rate 15, height 5\' 7"  (1.702 m), weight 65 kg, SpO2 100%.  Weight trends: Filed Weights   05/19/23 1643 05/20/23 1234  Weight: 66.2 kg 65 kg    Physical Exam: General: NAD  Head: Normocephalic, atraumatic. Moist oral mucosal membranes  Eyes: Anicteric  Lungs:  Clear to auscultation, normal effort  Heart: Regular rate and rhythm  Abdomen:  Soft, nontender, nondistended  Extremities: No peripheral edema.  Neurologic: Alert, moving all four extremities  Skin: Right foot gangrene  Access: None     Lab results: Basic Metabolic Panel: Recent Labs  Lab 05/19/23 1500 05/20/23 0051  NA 132* 131*  K 3.1* 3.4*  CL 94* 96*  CO2 26 25  GLUCOSE 134* 135*  BUN 22 24*  CREATININE 1.56* 1.41*  CALCIUM 8.6* 8.2*    Liver Function Tests: Recent Labs  Lab 05/19/23 1500 05/20/23 0051  AST 36 27  ALT 14 13  ALKPHOS 154* 127*  BILITOT 0.9 0.6  PROT 6.2* 5.3*  ALBUMIN 1.8* 1.6*   No results for input(s): "LIPASE", "AMYLASE" in the last 168 hours. No results for input(s): "AMMONIA" in the last 168 hours.  CBC: Recent Labs  Lab  05/19/23 1500 05/20/23 0051 05/20/23 0927  WBC 5.6 4.0 4.3  NEUTROABS 4.0  --   --   HGB 9.3* 7.5* 8.3*  HCT 28.5* 22.8* 26.1*  MCV 98.3 99.6 101.2*  PLT 136* 106* 117*    Cardiac Enzymes: No results for input(s): "CKTOTAL", "CKMB", "CKMBINDEX", "TROPONINI" in the last 168 hours.  BNP: Invalid input(s): "POCBNP"  CBG: Recent Labs  Lab 05/19/23 2120 05/20/23 0725 05/20/23 1104 05/20/23 1227  GLUCAP 138* 108* 102* 112*    Microbiology: Results for orders placed or performed during the hospital encounter of 05/28/21  Resp Panel by RT-PCR (Flu A&B, Covid) Nasopharyngeal Swab     Status: None   Collection Time: 05/28/21 11:28 AM   Specimen: Nasopharyngeal Swab; Nasopharyngeal(NP) swabs in vial transport medium  Result Value Ref Range Status   SARS Coronavirus 2 by RT PCR NEGATIVE NEGATIVE Final    Comment: (NOTE) SARS-CoV-2 target nucleic acids are NOT DETECTED.  The SARS-CoV-2 RNA is generally detectable in upper respiratory specimens during the acute phase of infection. The lowest concentration of SARS-CoV-2 viral copies this assay can detect is 138 copies/mL. A negative result does not preclude SARS-Cov-2 infection and should not be used as the sole basis for treatment or other patient  management decisions. A negative result may occur with  improper specimen collection/handling, submission of specimen other than nasopharyngeal swab, presence of viral mutation(s) within the areas targeted by this assay, and inadequate number of viral copies(<138 copies/mL). A negative result must be combined with clinical observations, patient history, and epidemiological information. The expected result is Negative.  Fact Sheet for Patients:  BloggerCourse.com  Fact Sheet for Healthcare Providers:  SeriousBroker.it  This test is no t yet approved or cleared by the [place] FDA and  has been authorized for detection and/or  diagnosis of SARS-CoV-2 by FDA under an Emergency Use Authorization (EUA). This EUA will remain  in effect (meaning this test can be used) for the duration of the COVID-19 declaration under Section 564(b)(1) of the Act, 21 U.S.C.section 360bbb-3(b)(1), unless the authorization is terminated  or revoked sooner.       Influenza A by PCR NEGATIVE NEGATIVE Final   Influenza B by PCR NEGATIVE NEGATIVE Final    Comment: (NOTE) The Xpert Xpress SARS-CoV-2/FLU/RSV plus assay is intended as an aid in the diagnosis of influenza from Nasopharyngeal swab specimens and should not be used as a sole basis for treatment. Nasal washings and aspirates are unacceptable for Xpert Xpress SARS-CoV-2/FLU/RSV testing.  Fact Sheet for Patients: BloggerCourse.com  Fact Sheet for Healthcare Providers: SeriousBroker.it  This test is not yet approved or cleared by the [place] FDA and has been authorized for detection and/or diagnosis of SARS-CoV-2 by FDA under an Emergency Use Authorization (EUA). This EUA will remain in effect (meaning this test can be used) for the duration of the COVID-19 declaration under Section 564(b)(1) of the Act, 21 U.S.C. section 360bbb-3(b)(1), unless the authorization is terminated or revoked.  Performed at [facility], [address] [place], [place] [address]     Coagulation Studies: Recent Labs    05/19/23 1630  LABPROT 14.2  INR 1.1    Urinalysis: No results for input(s): "COLORURINE", "LABSPEC", "PHURINE", "GLUCOSEU", "HGBUR", "BILIRUBINUR", "KETONESUR", "PROTEINUR", "UROBILINOGEN", "NITRITE", "LEUKOCYTESUR" in the last 72 hours.  Invalid input(s): "APPERANCEUR"    Imaging: DG Foot Complete Right  Result Date: 05/19/2023 CLINICAL DATA:  Ischemia. Right foot infection. Numerous mount of decay and eschar of right foot. Foot is visibly indicating, skin is brown, black, and leathery all the way  up the lateral side of the tibia/fibula. EXAM: RIGHT FOOT COMPLETE - 3+ VIEW COMPARISON:  Right foot radiographs 02/13/2015 FINDINGS: There is diffuse decreased bone mineralization. Interval marked attenuation of the forefoot soft tissues, especially around all of the toes. Moderate to severe first through fifth interphalangeal joint space narrowing, subchondral sclerosis, and peripheral osteophytosis. Moderate great toe metatarsophalangeal joint space narrowing. Tiny plantar calcaneal heel spur. Mild dorsal talar neck degenerative osteophytosis. No definite cortical erosion is identified. No acute fracture or dislocation. Moderate atherosclerotic calcifications. IMPRESSION: 1. Interval marked attenuation of the forefoot soft tissues, especially around all of the toes. 2. 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 3. Moderate great toe metatarsophalangeal joint osteoarthritis. 4. No definite cortical erosion is identified. Electronically Signed   By: Neita Garnet M.D.   On: 05/19/2023 17:02     Assessment & Plan: Theresa [name] is a 67 y.o.  female with past medical conditions including COPD, anemia, diabetes, CAD, hyperlipidemia, status post renal transplant, hypertension, who was admitted to [facility] on 05/19/2023 for Arterial occlusion [I70.90] Diabetes mellitus with foot ulcer and gangrene (HCC) [U04.540, L97.509, E11.52] Gangrene of right foot (HCC) [I96]  Chronic  kidney disease stage IIIb, status post cadaver renal transplant completed at [facility] on 03/30/2019.  Baseline creatinine 1-1.5.  Immunosuppressive medications include tacrolimus and Imuran.  Currently confirming tacrolimus dosing.  We will continue to monitor renal function.  2.  Hyponatremia/ hypokalemia.  Sodium 132 and potassium 3.1.  Oral supplementation for potassium ordered by primary team.  3.  Hypertension with chronic kidney disease.  Home regimen includes furosemide, clonidine, and isosorbide.   Furosemide currently held.  4. Anemia of chronic kidney disease Lab Results  Component Value Date   HGB 8.3 (L) 05/20/2023    Hemoglobin below desired range.  Will assess for need of ESA's.  LOS: 1 Beauden Tremont 12/11/20241:14 PM

## 2023-05-20 NOTE — ED Notes (Signed)
Additional skin assessment notes: pt with stapled wound to right groin with purulent drainage, new non stick island dressing applied. Pt with stage 2 skin breakdown noted to sacrum.

## 2023-05-20 NOTE — ED Notes (Signed)
Pt onto right side

## 2023-05-20 NOTE — Consult Note (Addendum)
Pharmacy Consult Note - Anticoagulation  Pharmacy Consult for heparin Indication:  limb ischemia  PATIENT MEASUREMENTS: Height: 5\' 7"  (170.2 cm) Weight: 66.2 kg (146 lb) IBW/kg (Calculated) : 61.6 HEPARIN DW (KG): 66.2  VITAL SIGNS: Temp: 98.3 F (36.8 C) (12/11 0049) Temp Source: Axillary (12/11 0047) BP: 143/45 (12/11 0049) Pulse Rate: 75 (12/11 0049)  Recent Labs    05/19/23 1630 05/20/23 0051  HGB  --  7.5*  HCT  --  22.8*  PLT  --  106*  APTT 30  --   LABPROT 14.2  --   INR 1.1  --   HEPARINUNFRC  --  0.40  CREATININE  --  1.41*    Estimated Creatinine Clearance: 38.7 mL/min (A) (by C-G formula based on SCr of 1.41 mg/dL (H)).  PAST MEDICAL HISTORY: Past Medical History:  Diagnosis Date   Anemia of chronic disease    Anginal pain (HCC)    Breast calcification seen on mammogram 04/08/2021   Carotid arterial disease (HCC)    a. 02/2013 U/S: 40-59% bilat ICA stenosis.   Chronic constipation    Chronic diastolic CHF (congestive heart failure) (HCC)    a. 10/2013 Echo [facility]): EF 55-60%, mod conc LVH, mod MR, mildly dil LA, mild Ao sclerosis w/o stenosis; b. 02/2016 Echo: EF 55-60%, no rwma, Gr DD, mild AS, mod to sev MR, mildly dil LA, PASP ; c. 07/2017 Echo [facility]): EF >55%, mild to mod LVH, Gr2 DD, Ao scl, Mod MR, mod dil LA, nl RV fxn.   Colon polyps    COPD (chronic obstructive pulmonary disease) (HCC)    Coronary artery disease    a. 05/2013 NSTEMI/PCI: LM 20d, LAD min irregs, LCX small, nl, OM1 nl, RCA dom 86m (2.5x16 Promus DES), PDA1 80p; b. 04/2015 Cath: LM nl, LAD 40m, D1/2 min irregs, LCX 35p/m, OM2/3 min irregs, RCA patent mid stent, RPDA 70ost, RPLB1 30, RPLB2/3 min irregs, EF 55-65%--> Med Rx; c. 06/2017 [facility]): No ischemia/infarct, EF 64%.   Coronary artery disease of native artery of native heart with stable angina pectoris (HCC) 06/27/2013   S/p drug eluting stent RCA 05/2013   COVID-19    06/2021   Diabetes mellitus    Diabetic neuropathy (HCC)     Diabetic retinopathy (HCC) 05/28/2013   Hx bilat retinal detachment, proliferative diab retinopathy and bilat vitreous hemorrhage    Emphysema    ESRD on hemodialysis (HCC)    a. [facility] in [place], [place] ([phone]) [phone]/Dr. [name], on a MWF schedule.  She started dialysis in Feb 2014.  Etiology of renal failure not known, likely diabetes.  Has a left upper arm AV graft.   History of bronchitis    Mar 2012   History of heart attack 01/05/2018   History of pneumonia    June 2012   History of tobacco abuse    a. Quit 2012.   Hypercalcemia 02/27/2020   Hyperkalemia 04/18/2020   Hyperlipidemia    Hypertension    Hyponatremia 05/22/2021   Moderate to severe mitral insufficiency    a. 10/2013 Echo: EF 55-60%, mod MR; b. 02/2016 Echo: EF 55-60%, mod to sev MR directed posteriorly--felt to be dynamic -worse with volume overload; c. 07/2017 Echo [facility]): EF >55%. Mod MR.   Myocardial infarct [facility]) 05/2013   Neuritis or radiculitis due to rupture of lumbar intervertebral disc 11/28/2014   Peripheral vascular disease (HCC)    Sickle cell trait (HCC)    Thyroid nodule    [place] 06/2017 due  to f/u US in 1 year    Valvular heart disease    echo 06/17/21 mild to moderate    ASSESSMENT: 67 y.o. female with PMH including T2DM, CAD, PAD, ESRD s/p DDKTP is presenting with limb ischemia. Patient is not on chronic anticoagulation per chart review. Pharmacy has been consulted to initiate and manage heparin intravenous infusion.  Pertinent medications: No chronic anticoagulation prior to admission per chart review  Goal(s) of therapy: Heparin level 0.3 - 0.7 units/mL Monitor platelets by anticoagulation protocol: Yes   Baseline anticoagulation labs: Recent Labs    05/19/23 1500 05/19/23 1630 05/20/23 0051  APTT  --  30  --   INR  --  1.1  --   HGB 9.3*  --  7.5*  PLT 136*  --  106*    Date Time aPTT/HL Rate/Comment 12/11 0051 HL 0.40 Therapeutic x 1    PLAN: Continue heparin infusion at 1000  units/hour. Recheck heparin level in 8 hours, then daily once at least two levels are consecutively therapeutic. HGB & Platelet trending down, will recheck CBC w/ HL then monitor CBC daily while on heparin infusion.  [name], PharmD, [facility] 05/20/2023 2:48 AM

## 2023-05-20 NOTE — Interval H&P Note (Signed)
History and Physical Interval Note:  05/20/2023 12:21 PM  Theresa [name]  has presented today for surgery, with the diagnosis of ischemic leg.  The various methods of treatment have been discussed with the patient and family. After consideration of risks, benefits and other options for treatment, the patient has consented to  Procedure(s): Lower Extremity Angiography (Right) as a surgical intervention.  The patient's history has been reviewed, patient examined, no change in status, stable for surgery.  I have reviewed the patient's chart and labs.  Questions were answered to the patient's satisfaction.     [name]

## 2023-05-20 NOTE — ED Notes (Signed)
Lab at bedside attempting venipuncture

## 2023-05-20 NOTE — ED Notes (Signed)
Pt requesting pain medication for right foot. Secure chat message sent to md to inquire

## 2023-05-20 NOTE — H&P (View-Only) (Signed)
Hospital Consult    Reason for Consult:  Gangrene of the right foot.  Requesting Physician:  Dr Esaw Grandchild MD.  MRN #:  [id number]  History of Present Illness: This is a 67 y.o. female  who presents to [facility] emergency department for peripheral vascular disease who is a resident in a group home brought in on account of worsening gangrene noted on the right foot. History obtained from the chart. Patient endorses her foot has been like this over the past 2 months. She endorses pain. She states she is unable to walk at this time and stays in bed most of the time. When ambulating she has been using a wheelchair. Vascular Surgery consulted to evaluate.   Past Medical History:  Diagnosis Date   Anemia of chronic disease    Anginal pain (HCC)    Breast calcification seen on mammogram 04/08/2021   Carotid arterial disease (HCC)    a. 02/2013 U/S: 40-59% bilat ICA stenosis.   Chronic constipation    Chronic diastolic CHF (congestive heart failure) (HCC)    a. 10/2013 Echo [facility]): EF 55-60%, mod conc LVH, mod MR, mildly dil LA, mild Ao sclerosis w/o stenosis; b. 02/2016 Echo: EF 55-60%, no rwma, Gr DD, mild AS, mod to sev MR, mildly dil LA, PASP ; c. 07/2017 Echo [facility]): EF >55%, mild to mod LVH, Gr2 DD, Ao scl, Mod MR, mod dil LA, nl RV fxn.   Colon polyps    COPD (chronic obstructive pulmonary disease) (HCC)    Coronary artery disease    a. 05/2013 NSTEMI/PCI: LM 20d, LAD min irregs, LCX small, nl, OM1 nl, RCA dom 58m (2.5x16 Promus DES), PDA1 80p; b. 04/2015 Cath: LM nl, LAD 32m, D1/2 min irregs, LCX 35p/m, OM2/3 min irregs, RCA patent mid stent, RPDA 70ost, RPLB1 30, RPLB2/3 min irregs, EF 55-65%--> Med Rx; c. 06/2017 [facility]): No ischemia/infarct, EF 64%.   Coronary artery disease of native artery of native heart with stable angina pectoris (HCC) 06/27/2013   S/p drug eluting stent RCA 05/2013   COVID-19    06/2021   Diabetes mellitus    Diabetic neuropathy (HCC)    Diabetic retinopathy  (HCC) 05/28/2013   Hx bilat retinal detachment, proliferative diab retinopathy and bilat vitreous hemorrhage    Emphysema    ESRD on hemodialysis (HCC)    a. [facility] in [place], [place] (336) [phone]/Dr. [name], on a MWF schedule.  She started dialysis in Feb 2014.  Etiology of renal failure not known, likely diabetes.  Has a left upper arm AV graft.   History of bronchitis    Mar 2012   History of heart attack 01/05/2018   History of pneumonia    June 2012   History of tobacco abuse    a. Quit 2012.   Hypercalcemia 02/27/2020   Hyperkalemia 04/18/2020   Hyperlipidemia    Hypertension    Hyponatremia 05/22/2021   Moderate to severe mitral insufficiency    a. 10/2013 Echo: EF 55-60%, mod MR; b. 02/2016 Echo: EF 55-60%, mod to sev MR directed posteriorly--felt to be dynamic -worse with volume overload; c. 07/2017 Echo [facility]): EF >55%. Mod MR.   Myocardial infarct [facility]) 05/2013   Neuritis or radiculitis due to rupture of lumbar intervertebral disc 11/28/2014   Peripheral vascular disease (HCC)    Sickle cell trait (HCC)    Thyroid nodule    [place] 06/2017 due to f/u US in 1 year    Valvular heart disease    echo 06/17/21  mild to moderate    Past Surgical History:  Procedure Laterality Date   A/V FISTULAGRAM Left 11/10/2017   Procedure: A/V FISTULAGRAM;  Surgeon: Renford Dills, MD;  Location: [facility] INVASIVE CV LAB;  Service: Cardiovascular;  Laterality: Left;   A/V SHUNTOGRAM Left 02/08/2019   Procedure: A/V SHUNTOGRAM;  Surgeon: Renford Dills, MD;  Location: [facility] INVASIVE CV LAB;  Service: Cardiovascular;  Laterality: Left;   ABDOMINAL HYSTERECTOMY     2000 for cysts on ovaries total no abnormal pap   CARDIAC CATHETERIZATION     CARDIAC CATHETERIZATION N/A 05/02/2015   Procedure: Left Heart Cath and Coronary Angiography;  Surgeon: Iran Ouch, MD;  Location: [facility] INVASIVE CV LAB;  Service: Cardiovascular;  Laterality: N/A;   COLONOSCOPY WITH PROPOFOL N/A 07/08/2016   Procedure:  COLONOSCOPY WITH PROPOFOL;  Surgeon: Wyline Mood, MD;  Location: [facility] ENDOSCOPY;  Service: Endoscopy;  Laterality: N/A;   COLONOSCOPY WITH PROPOFOL N/A 08/20/2017   Procedure: COLONOSCOPY WITH PROPOFOL;  Surgeon: Wyline Mood, MD;  Location: [facility] ENDOSCOPY;  Service: Gastroenterology;  Laterality: N/A;   COLONOSCOPY WITH PROPOFOL N/A 09/06/2021   Procedure: COLONOSCOPY WITH PROPOFOL;  Surgeon: Wyline Mood, MD;  Location: [facility] ENDOSCOPY;  Service: Gastroenterology;  Laterality: N/A;   colonscopy     CORONARY ANGIOPLASTY  05/28/2014   stent placement to the mid RCA   CYSTOURETHROSCOPY     [facility] urology with stent placement 11 or 05/2019 and stent removal 06/13/19    DILATION AND CURETTAGE OF UTERUS     several in the early 80's   ESOPHAGOGASTRODUODENOSCOPY     2012   ESOPHAGOGASTRODUODENOSCOPY N/A 09/06/2021   Procedure: ESOPHAGOGASTRODUODENOSCOPY (EGD);  Surgeon: Wyline Mood, MD;  Location: [facility] ENDOSCOPY;  Service: Gastroenterology;  Laterality: N/A;   ESOPHAGOGASTRODUODENOSCOPY (EGD) WITH PROPOFOL N/A 03/11/2018   Procedure: ESOPHAGOGASTRODUODENOSCOPY (EGD) WITH PROPOFOL;  Surgeon: Wyline Mood, MD;  Location: [facility] ENDOSCOPY;  Service: Gastroenterology;  Laterality: N/A;   EYE SURGERY     bilateral laser 2012   EYE SURGERY     right   EYE SURGERY     x4 both eyes   GAS INSERTION  09/30/2011   Procedure: INSERTION OF GAS;  Surgeon: Sherrie George, MD;  Location: [facility] OR;  Service: Ophthalmology;  Laterality: Right;  [id number]   GAS/FLUID EXCHANGE  09/30/2011   Procedure: GAS/FLUID EXCHANGE;  Surgeon: Sherrie George, MD;  Location: [facility] OR;  Service: Ophthalmology;  Laterality: Right;   KIDNEY TRANSPLANT     03/30/19 Dr. Vernona Rieger Thomas/Dr. Trinna Post Zendel/Dr. Sundra Aland; [facility] transplant Dr. Clelia Croft will be primary [phone] coordinator Sarah Wynkoop [phone]   KIDNEY TRANSPLANT     03/30/19 [facility]   LEFT HEART CATHETERIZATION WITH CORONARY ANGIOGRAM N/A 05/28/2013   Procedure: LEFT HEART  CATHETERIZATION WITH CORONARY ANGIOGRAM;  Surgeon: Corky Crafts, MD;  Location: [facility] CATH LAB;  Service: Cardiovascular;  Laterality: N/A;   PARS PLANA VITRECTOMY  04/22/2011   Procedure: PARS PLANA VITRECTOMY WITH 25 GAUGE;  Surgeon: Sherrie George, MD;  Location: [facility] OR;  Service: Ophthalmology;  Laterality: Left;  membrane peel, endolaser, gas fluid exchange, silicone oil, repair of complex traction retinal detachment   PARS PLANA VITRECTOMY  09/30/2011   Procedure: PARS PLANA VITRECTOMY WITH 25 GAUGE;  Surgeon: Sherrie George, MD;  Location: [facility] OR;  Service: Ophthalmology;  Laterality: Right;  Endolaser; Repair of Complex Traction Retinal Detachment   PARS PLANA VITRECTOMY  02/24/2012   Procedure: PARS PLANA VITRECTOMY WITH 25  GAUGE;  Surgeon: Sherrie George, MD;  Location: [facility] OR;  Service: Ophthalmology;  Laterality: Left;   PTCA     SILICON OIL REMOVAL  02/24/2012   Procedure: SILICON OIL REMOVAL;  Surgeon: Sherrie George, MD;  Location: [facility] OR;  Service: Ophthalmology;  Laterality: Left;   THROMBECTOMY / ARTERIOVENOUS GRAFT REVISION     TUBAL LIGATION     1979   UPPER EXTREMITY ANGIOGRAPHY Left 02/08/2019   Procedure: UPPER EXTREMITY ANGIOGRAPHY;  Surgeon: Renford Dills, MD;  Location: [facility] INVASIVE CV LAB;  Service: Cardiovascular;  Laterality: Left;    Allergies  Allergen Reactions   Hydrocodone Hives   Ondansetron Anxiety and Other (See Comments)    Causes hallucinations    Lisinopril     Unknown reaction     Prior to Admission medications   Medication Sig Start Date End Date Taking? Authorizing Provider  acetaminophen (TYLENOL) 500 MG tablet Take 1,000 mg by mouth daily as needed. 03/31/19  Yes [provider]  albuterol (PROVENTIL) (2.5 MG/3ML) 0.083% nebulizer solution Take 3 mLs (2.5 mg total) by nebulization every 6 (six) hours as needed for wheezing or shortness of breath. 12/31/22  Yes Dana Allan, MD  atorvastatin (LIPITOR) 20 MG tablet Take 1  tablet (20 mg total) by mouth daily. 04/22/22  Yes Gollan, Tollie Pizza, MD  cloNIDine (CATAPRES) 0.1 MG tablet TAKE 1 TABLET BY MOUTH TWICE DAILY (MAY  TAKE  AN  EXTRA  ONE  TABLET  FOR  BLOOD  PRESSURE  OVER  160) 12/17/22  Yes Gollan, Tollie Pizza, MD  ENVARSUS XR 1 MG TB24 Take 3 mg by mouth daily. 01/18/21  Yes [provider]  esomeprazole (NEXIUM) 20 MG capsule Take 20 mg by mouth daily. 04/16/20  Yes [provider]  furosemide (LASIX) 40 MG tablet Take 80 mg by mouth 2 (two) times daily as needed for edema or fluid. 03/20/23  Yes [provider]  lidocaine 4 % Place 1 patch onto the skin daily as needed. 03/21/23  Yes [provider]  LORazepam (ATIVAN) 0.5 MG tablet Take 0.5 mg by mouth every 4 (four) hours as needed for sleep or anxiety. 04/28/23  Yes [provider]  Morphine Sulfate (MORPHINE CONCENTRATE) 10 mg / 0.5 ml concentrated solution Take 10 mg by mouth as directed. Every 2 hours per hospice nurse 03/20/23  Yes [provider]  nitroGLYCERIN (NITROSTAT) 0.4 MG SL tablet Place 1 tablet (0.4 mg total) under the tongue every 5 (five) minutes as needed for chest pain. 04/27/20  Yes Gollan, Tollie Pizza, MD  oxyCODONE (OXY IR/ROXICODONE) 5 MG immediate release tablet Take 5 mg by mouth every 4 (four) hours as needed for moderate pain (pain score 4-6) or severe pain (pain score 7-10). 04/05/23  Yes [provider]  pregabalin (LYRICA) 75 MG capsule Take 75 mg by mouth 2 (two) times daily.   Yes Detwiler, Anabel Bene, MD  sodium bicarbonate 650 MG tablet Take 650 mg by mouth 2 (two) times daily as needed for heartburn.   Yes [provider]  isosorbide mononitrate (IMDUR) 30 MG 24 hr tablet Take 1 tablet by mouth once daily Patient not taking: Reported on 05/19/2023 10/17/22   Antonieta Iba, MD    Social History   Socioeconomic History   Marital status: Single    Spouse name: Not on file   Number of children: 2   Years of  education: Not on file   Highest education level: Not  on file  Occupational History   Not on file  Tobacco Use   Smoking status: Former    Current packs/day: 0.00    Average packs/day: 1 pack/day for 25.0 years (25.0 ttl pk-yrs)    Types: Cigarettes    Start date: 03/29/1994    Quit date: 03/30/2019    Years since quitting: 4.1   Smokeless tobacco: Never  Vaping Use   Vaping status: Never Used  Substance and Sexual Activity   Alcohol use: No   Drug use: Yes    Types: Marijuana    Comment: daily   Sexual activity: Never  Other Topics Concern   Not on file  Social History Narrative   On disability.    Lives with son Vaughan Basta   2 children (has son and daughter)      Son drives her since her vision has decreased   Social Determinants of Health   Financial Resource Strain: Low Risk  (02/13/2023)   Received from [facility]   Overall Financial Resource Strain (CARDIA)    Difficulty of Paying Living Expenses: Not hard at all  Food Insecurity: No Food Insecurity (02/13/2023)   [facility] from [facility]   Hunger Vital Sign    Worried About Running Out of Food in the Last Year: Never true    Ran Out of Food in the Last Year: Never true  Transportation Needs: No Transportation Needs (02/13/2023)   Received from [facility] - Transportation    Lack of Transportation (Medical): No    Lack of Transportation (Non-Medical): No  Physical Activity: Inactive (12/17/2022)   Exercise Vital Sign    Days of Exercise per Week: 0 days    Minutes of Exercise per Session: 0 min  Stress: No Stress Concern Present (12/17/2022)   [employer] of Occupational Health - Occupational Stress Questionnaire    Feeling of Stress : Not at all  Social Connections: Socially Isolated (12/17/2022)   Social Connection and Isolation Panel [NHANES]    Frequency of Communication with Friends and Family: More than three times a week    Frequency of Social Gatherings with Friends and Family:  More than three times a week    Attends Religious Services: Never    Database administrator or Organizations: No    Attends Banker Meetings: Never    Marital Status: Widowed  Intimate Partner Violence: Not At Risk (12/17/2022)   Humiliation, Afraid, Rape, and Kick questionnaire    Fear of Current or Ex-Partner: No    Emotionally Abused: No    Physically Abused: No    Sexually Abused: No     Family History  Problem Relation Age of Onset   Stroke Mother    Heart attack Mother    Heart disease Mother    Colon cancer Father    Colon cancer Sister    Breast cancer Sister    Heart attack Brother    Stroke Brother    Diabetes Brother    Diabetes Brother    Diabetes Daughter    Renal Disease Daughter        on HD   Kidney failure Daughter        age 15 as of 12/03/20   Anesthesia problems Neg Hx    Hypotension Neg Hx    Malignant hyperthermia Neg Hx    Pseudochol deficiency Neg Hx     ROS: Otherwise negative unless mentioned in HPI  Physical Examination  Vitals:  05/20/23 0509 05/20/23 0721  BP:  (!) 147/44  Pulse:  70  Resp:  16  Temp: 98.3 F (36.8 C)   SpO2:  99%   Body mass index is 22.87 kg/m.  General:  WDWN in NAD Gait: Not observed HENT: WNL, normocephalic Pulmonary: normal non-labored breathing, without Rales, rhonchi,  wheezing Cardiac: regular, without  Murmurs, rubs or gallops; without carotid bruits Abdomen: Positive bowel sounds,  soft, NT/ND, no masses Skin: without rashes Vascular Exam/Pulses: Femoral pulses in right lower extremity only. Knee to toes are cold to touch. Left lower extremity with palpable pulses and warm to touch Extremities: with ischemic changes, with Gangrene , with cellulitis; with open wounds;  Musculoskeletal: Complete muscle wasting or atrophy with failure to thrive.   Neurologic: A&O X 3;  No focal weakness or paresthesias are detected; speech is fluent/normal Psychiatric:  The pt has Normal affect. Lymph:   Unremarkable  CBC    Component Value Date/Time   WBC 4.0 05/20/2023 0051   RBC 2.29 (L) 05/20/2023 0051   HGB 7.5 (L) 05/20/2023 0051   HGB 12.2 05/12/2014 0741   HCT 22.8 (L) 05/20/2023 0051   HCT 38.9 05/12/2014 0741   PLT 106 (L) 05/20/2023 0051   PLT 133 (L) 05/12/2014 0741   MCV 99.6 05/20/2023 0051   MCV 102 (H) 05/12/2014 0741   MCH 32.8 05/20/2023 0051   MCHC 32.9 05/20/2023 0051   RDW 17.2 (H) 05/20/2023 0051   RDW 16.0 (H) 05/12/2014 0741   LYMPHSABS 1.1 05/19/2023 1500   LYMPHSABS 2.4 02/15/2014 0837   MONOABS 0.4 05/19/2023 1500   MONOABS 0.5 02/15/2014 0837   EOSABS 0.1 05/19/2023 1500   EOSABS 0.2 02/15/2014 0837   BASOSABS 0.0 05/19/2023 1500   BASOSABS 0.0 02/15/2014 0837    BMET    Component Value Date/Time   NA 131 (L) 05/20/2023 0051   NA 143 07/27/2015 1737   NA 137 05/14/2014 0741   K 3.4 (L) 05/20/2023 0051   K 3.3 (L) 05/14/2014 1500   CL 96 (L) 05/20/2023 0051   CL 98 05/14/2014 0741   CO2 25 05/20/2023 0051   CO2 35 (H) 05/14/2014 0741   GLUCOSE 135 (H) 05/20/2023 0051   GLUCOSE 96 05/14/2014 0741   BUN 24 (H) 05/20/2023 0051   BUN 25 (H) 05/14/2014 0741   CREATININE 1.41 (H) 05/20/2023 0051   CREATININE 5.83 (H) 05/14/2014 0741   CALCIUM 8.2 (L) 05/20/2023 0051   CALCIUM 8.9 05/14/2014 0741   GFRNONAA 41 (L) 05/20/2023 0051   GFRNONAA 8 (L) 05/14/2014 0741   GFRNONAA 4 (L) 02/15/2014 0837   GFRAA 7 (L) 01/12/2018 1638   GFRAA 10 (L) 05/14/2014 0741   GFRAA 5 (L) 02/15/2014 0837    COAGS: Lab Results  Component Value Date   INR 1.1 05/19/2023   INR 1.00 03/11/2017   INR 1.17 05/02/2015     Non-Invasive Vascular Imaging:   EXAM:05/19/23 RIGHT FOOT COMPLETE - 3+ VIEW   COMPARISON:  Right foot radiographs 02/13/2015   FINDINGS: There is diffuse decreased bone mineralization. Interval marked attenuation of the forefoot soft tissues, especially around all of the toes. Moderate to severe first through fifth  interphalangeal joint space narrowing, subchondral sclerosis, and peripheral osteophytosis. Moderate great toe metatarsophalangeal joint space narrowing. Tiny plantar calcaneal heel spur. Mild dorsal talar neck degenerative osteophytosis.   No definite cortical erosion is identified. No acute fracture or dislocation. Moderate atherosclerotic calcifications.   IMPRESSION: 1. Interval marked attenuation of  the forefoot soft tissues, especially around all of the toes. 2. 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 3. Moderate great toe metatarsophalangeal joint osteoarthritis. 4. No definite cortical erosion is identified.  Statin:  Yes.   Beta Blocker:  No. Aspirin:  No. ACEI:  No. ARB:  No. CCB use:  No Other antiplatelets/anticoagulants:  No.    ASSESSMENT/PLAN: This is a 67 y.o. female who presents to [facility] emergency department from a group home in which she was living for right lower extremity gangrene.  After examination is obvious the patient will need an angiogram to determine what type of amputation she will need.  At this time upon examination her right leg is cold from the knee down and she does not ambulate well.  An above-the-knee amputation on the right would be best.  Vascular surgery plans on taking the patient to the vascular lab later today 05/20/2023 for right lower extremity angiogram with possible intervention.  Plan is to determine what type of surgery/AKA or BKA she will need.  I discussed in detail with the patient this morning the procedure, benefits, risk, and complications.  Patient verbalizes her understanding and wishes to proceed.  I answered all the patient's questions this morning.  Patient has been n.p.o. since midnight last night.   -I discussed the case in detail with Dr. [name] MD and he agrees with plan.   Marcie Bal [facility] 05/20/2023 7:23 AM

## 2023-05-20 NOTE — ED Notes (Signed)
Pt turned onto left side, pillow between knees.

## 2023-05-20 NOTE — ED Notes (Signed)
Heparin paused for venipuncture

## 2023-05-20 NOTE — ED Notes (Signed)
Not able to place sacral dressing, adhesive from dressing would be placed directly over open skin.

## 2023-05-20 NOTE — Consult Note (Signed)
Hospital Consult    Reason for Consult:  Gangrene of the right foot.  Requesting Physician:  Dr Esaw Grandchild MD.  MRN #:  [id number]  History of Present Illness: This is a 67 y.o. female  who presents to [facility] emergency department for peripheral vascular disease who is a resident in a group home brought in on account of worsening gangrene noted on the right foot. History obtained from the chart. Patient endorses her foot has been like this over the past 2 months. She endorses pain. She states she is unable to walk at this time and stays in bed most of the time. When ambulating she has been using a wheelchair. Vascular Surgery consulted to evaluate.   Past Medical History:  Diagnosis Date   Anemia of chronic disease    Anginal pain (HCC)    Breast calcification seen on mammogram 04/08/2021   Carotid arterial disease (HCC)    a. 02/2013 U/S: 40-59% bilat ICA stenosis.   Chronic constipation    Chronic diastolic CHF (congestive heart failure) (HCC)    a. 10/2013 Echo [facility]): EF 55-60%, mod conc LVH, mod MR, mildly dil LA, mild Ao sclerosis w/o stenosis; b. 02/2016 Echo: EF 55-60%, no rwma, Gr DD, mild AS, mod to sev MR, mildly dil LA, PASP ; c. 07/2017 Echo [facility]): EF >55%, mild to mod LVH, Gr2 DD, Ao scl, Mod MR, mod dil LA, nl RV fxn.   Colon polyps    COPD (chronic obstructive pulmonary disease) (HCC)    Coronary artery disease    a. 05/2013 NSTEMI/PCI: LM 20d, LAD min irregs, LCX small, nl, OM1 nl, RCA dom 58m (2.5x16 Promus DES), PDA1 80p; b. 04/2015 Cath: LM nl, LAD 32m, D1/2 min irregs, LCX 35p/m, OM2/3 min irregs, RCA patent mid stent, RPDA 70ost, RPLB1 30, RPLB2/3 min irregs, EF 55-65%--> Med Rx; c. 06/2017 [facility]): No ischemia/infarct, EF 64%.   Coronary artery disease of native artery of native heart with stable angina pectoris (HCC) 06/27/2013   S/p drug eluting stent RCA 05/2013   COVID-19    06/2021   Diabetes mellitus    Diabetic neuropathy (HCC)    Diabetic retinopathy  (HCC) 05/28/2013   Hx bilat retinal detachment, proliferative diab retinopathy and bilat vitreous hemorrhage    Emphysema    ESRD on hemodialysis (HCC)    a. [facility] in [place], [place] [phone]/Dr. [name], on a MWF schedule.  She started dialysis in Feb 2014.  Etiology of renal failure not known, likely diabetes.  Has a left upper arm AV graft.   History of bronchitis    Mar 2012   History of heart attack 01/05/2018   History of pneumonia    June 2012   History of tobacco abuse    a. Quit 2012.   Hypercalcemia 02/27/2020   Hyperkalemia 04/18/2020   Hyperlipidemia    Hypertension    Hyponatremia 05/22/2021   Moderate to severe mitral insufficiency    a. 10/2013 Echo: EF 55-60%, mod MR; b. 02/2016 Echo: EF 55-60%, mod to sev MR directed posteriorly--felt to be dynamic -worse with volume overload; c. 07/2017 Echo [facility]): EF >55%. Mod MR.   Myocardial infarct [facility]) 05/2013   Neuritis or radiculitis due to rupture of lumbar intervertebral disc 11/28/2014   Peripheral vascular disease (HCC)    Sickle cell trait (HCC)    Thyroid nodule    [place] 06/2017 due to f/u US in 1 year    Valvular heart disease    echo 06/17/21  mild to moderate    Past Surgical History:  Procedure Laterality Date   A/V FISTULAGRAM Left 11/10/2017   Procedure: A/V FISTULAGRAM;  Surgeon: Renford Dills, MD;  Location: [facility] INVASIVE CV LAB;  Service: Cardiovascular;  Laterality: Left;   A/V SHUNTOGRAM Left 02/08/2019   Procedure: A/V SHUNTOGRAM;  Surgeon: Renford Dills, MD;  Location: [facility] INVASIVE CV LAB;  Service: Cardiovascular;  Laterality: Left;   ABDOMINAL HYSTERECTOMY     2000 for cysts on ovaries total no abnormal pap   CARDIAC CATHETERIZATION     CARDIAC CATHETERIZATION N/A 05/02/2015   Procedure: Left Heart Cath and Coronary Angiography;  Surgeon: Iran Ouch, MD;  Location: [facility] INVASIVE CV LAB;  Service: Cardiovascular;  Laterality: N/A;   COLONOSCOPY WITH PROPOFOL N/A 07/08/2016   Procedure:  COLONOSCOPY WITH PROPOFOL;  Surgeon: Wyline Mood, MD;  Location: [facility] ENDOSCOPY;  Service: Endoscopy;  Laterality: N/A;   COLONOSCOPY WITH PROPOFOL N/A 08/20/2017   Procedure: COLONOSCOPY WITH PROPOFOL;  Surgeon: Wyline Mood, MD;  Location: [facility] ENDOSCOPY;  Service: Gastroenterology;  Laterality: N/A;   COLONOSCOPY WITH PROPOFOL N/A 09/06/2021   Procedure: COLONOSCOPY WITH PROPOFOL;  Surgeon: Wyline Mood, MD;  Location: [facility] ENDOSCOPY;  Service: Gastroenterology;  Laterality: N/A;   colonscopy     CORONARY ANGIOPLASTY  05/28/2014   stent placement to the mid RCA   CYSTOURETHROSCOPY     [facility] urology with stent placement 11 or 05/2019 and stent removal 06/13/19    DILATION AND CURETTAGE OF UTERUS     several in the early 80's   ESOPHAGOGASTRODUODENOSCOPY     2012   ESOPHAGOGASTRODUODENOSCOPY N/A 09/06/2021   Procedure: ESOPHAGOGASTRODUODENOSCOPY (EGD);  Surgeon: Wyline Mood, MD;  Location: [facility] ENDOSCOPY;  Service: Gastroenterology;  Laterality: N/A;   ESOPHAGOGASTRODUODENOSCOPY (EGD) WITH PROPOFOL N/A 03/11/2018   Procedure: ESOPHAGOGASTRODUODENOSCOPY (EGD) WITH PROPOFOL;  Surgeon: Wyline Mood, MD;  Location: [facility] ENDOSCOPY;  Service: Gastroenterology;  Laterality: N/A;   EYE SURGERY     bilateral laser 2012   EYE SURGERY     right   EYE SURGERY     x4 both eyes   GAS INSERTION  09/30/2011   Procedure: INSERTION OF GAS;  Surgeon: Sherrie George, MD;  Location: [facility] OR;  Service: Ophthalmology;  Laterality: Right;  [id number]   GAS/FLUID EXCHANGE  09/30/2011   Procedure: GAS/FLUID EXCHANGE;  Surgeon: Sherrie George, MD;  Location: [facility] OR;  Service: Ophthalmology;  Laterality: Right;   KIDNEY TRANSPLANT     03/30/19 Dr. Vernona Rieger Thomas/Dr. Trinna Post Zendel/Dr. Sundra Aland; [facility] transplant Dr. Clelia Croft will be primary [phone] coordinator Sarah Wynkoop [phone]   KIDNEY TRANSPLANT     03/30/19 [facility]   LEFT HEART CATHETERIZATION WITH CORONARY ANGIOGRAM N/A 05/28/2013   Procedure: LEFT HEART  CATHETERIZATION WITH CORONARY ANGIOGRAM;  Surgeon: Corky Crafts, MD;  Location: [facility] CATH LAB;  Service: Cardiovascular;  Laterality: N/A;   PARS PLANA VITRECTOMY  04/22/2011   Procedure: PARS PLANA VITRECTOMY WITH 25 GAUGE;  Surgeon: Sherrie George, MD;  Location: [facility] OR;  Service: Ophthalmology;  Laterality: Left;  membrane peel, endolaser, gas fluid exchange, silicone oil, repair of complex traction retinal detachment   PARS PLANA VITRECTOMY  09/30/2011   Procedure: PARS PLANA VITRECTOMY WITH 25 GAUGE;  Surgeon: Sherrie George, MD;  Location: [facility] OR;  Service: Ophthalmology;  Laterality: Right;  Endolaser; Repair of Complex Traction Retinal Detachment   PARS PLANA VITRECTOMY  02/24/2012   Procedure: PARS PLANA VITRECTOMY WITH 25  GAUGE;  Surgeon: Sherrie George, MD;  Location: [facility] OR;  Service: Ophthalmology;  Laterality: Left;   PTCA     SILICON OIL REMOVAL  02/24/2012   Procedure: SILICON OIL REMOVAL;  Surgeon: Sherrie George, MD;  Location: [facility] OR;  Service: Ophthalmology;  Laterality: Left;   THROMBECTOMY / ARTERIOVENOUS GRAFT REVISION     TUBAL LIGATION     1979   UPPER EXTREMITY ANGIOGRAPHY Left 02/08/2019   Procedure: UPPER EXTREMITY ANGIOGRAPHY;  Surgeon: Renford Dills, MD;  Location: [facility] INVASIVE CV LAB;  Service: Cardiovascular;  Laterality: Left;    Allergies  Allergen Reactions   Hydrocodone Hives   Ondansetron Anxiety and Other (See Comments)    Causes hallucinations    Lisinopril     Unknown reaction     Prior to Admission medications   Medication Sig Start Date End Date Taking? Authorizing Provider  acetaminophen (TYLENOL) 500 MG tablet Take 1,000 mg by mouth daily as needed. 03/31/19  Yes [provider]  albuterol (PROVENTIL) (2.5 MG/3ML) 0.083% nebulizer solution Take 3 mLs (2.5 mg total) by nebulization every 6 (six) hours as needed for wheezing or shortness of breath. 12/31/22  Yes Dana Allan, MD  atorvastatin (LIPITOR) 20 MG tablet Take 1  tablet (20 mg total) by mouth daily. 04/22/22  Yes Gollan, Tollie Pizza, MD  cloNIDine (CATAPRES) 0.1 MG tablet TAKE 1 TABLET BY MOUTH TWICE DAILY (MAY  TAKE  AN  EXTRA  ONE  TABLET  FOR  BLOOD  PRESSURE  OVER  160) 12/17/22  Yes Gollan, Tollie Pizza, MD  ENVARSUS XR 1 MG TB24 Take 3 mg by mouth daily. 01/18/21  Yes [provider]  esomeprazole (NEXIUM) 20 MG capsule Take 20 mg by mouth daily. 04/16/20  Yes [provider]  furosemide (LASIX) 40 MG tablet Take 80 mg by mouth 2 (two) times daily as needed for edema or fluid. 03/20/23  Yes [provider]  lidocaine 4 % Place 1 patch onto the skin daily as needed. 03/21/23  Yes [provider]  LORazepam (ATIVAN) 0.5 MG tablet Take 0.5 mg by mouth every 4 (four) hours as needed for sleep or anxiety. 04/28/23  Yes [provider]  Morphine Sulfate (MORPHINE CONCENTRATE) 10 mg / 0.5 ml concentrated solution Take 10 mg by mouth as directed. Every 2 hours per hospice nurse 03/20/23  Yes [provider]  nitroGLYCERIN (NITROSTAT) 0.4 MG SL tablet Place 1 tablet (0.4 mg total) under the tongue every 5 (five) minutes as needed for chest pain. 04/27/20  Yes Gollan, Tollie Pizza, MD  oxyCODONE (OXY IR/ROXICODONE) 5 MG immediate release tablet Take 5 mg by mouth every 4 (four) hours as needed for moderate pain (pain score 4-6) or severe pain (pain score 7-10). 04/05/23  Yes [provider]  pregabalin (LYRICA) 75 MG capsule Take 75 mg by mouth 2 (two) times daily.   Yes Detwiler, Anabel Bene, MD  sodium bicarbonate 650 MG tablet Take 650 mg by mouth 2 (two) times daily as needed for heartburn.   Yes [provider]  isosorbide mononitrate (IMDUR) 30 MG 24 hr tablet Take 1 tablet by mouth once daily Patient not taking: Reported on 05/19/2023 10/17/22   Antonieta Iba, MD    Social History   Socioeconomic History   Marital status: Single    Spouse name: Not on file   Number of children: 2   Years of  education: Not on file   Highest education level: Not  on file  Occupational History   Not on file  Tobacco Use   Smoking status: Former    Current packs/day: 0.00    Average packs/day: 1 pack/day for 25.0 years (25.0 ttl pk-yrs)    Types: Cigarettes    Start date: 03/29/1994    Quit date: 03/30/2019    Years since quitting: 4.1   Smokeless tobacco: Never  Vaping Use   Vaping status: Never Used  Substance and Sexual Activity   Alcohol use: No   Drug use: Yes    Types: Marijuana    Comment: daily   Sexual activity: Never  Other Topics Concern   Not on file  Social History Narrative   On disability.    Lives with son Vaughan Basta   2 children (has son and daughter)      Son drives her since her vision has decreased   Social Determinants of Health   Financial Resource Strain: Low Risk  (02/13/2023)   Received from [facility]   Overall Financial Resource Strain (CARDIA)    Difficulty of Paying Living Expenses: Not hard at all  Food Insecurity: No Food Insecurity (02/13/2023)   [facility]   Hunger Vital Sign    Worried About Running Out of Food in the Last Year: Never true    Ran Out of Food in the Last Year: Never true  Transportation Needs: No Transportation Needs (02/13/2023)   Received from [facility] - Transportation    Lack of Transportation (Medical): No    Lack of Transportation (Non-Medical): No  Physical Activity: Inactive (12/17/2022)   Exercise Vital Sign    Days of Exercise per Week: 0 days    Minutes of Exercise per Session: 0 min  Stress: No Stress Concern Present (12/17/2022)   [employer] of Occupational Health - Occupational Stress Questionnaire    Feeling of Stress : Not at all  Social Connections: Socially Isolated (12/17/2022)   Social Connection and Isolation Panel [NHANES]    Frequency of Communication with Friends and Family: More than three times a week    Frequency of Social Gatherings with Friends and Family:  More than three times a week    Attends Religious Services: Never    Database administrator or Organizations: No    Attends Banker Meetings: Never    Marital Status: Widowed  Intimate Partner Violence: Not At Risk (12/17/2022)   Humiliation, Afraid, Rape, and Kick questionnaire    Fear of Current or Ex-Partner: No    Emotionally Abused: No    Physically Abused: No    Sexually Abused: No     Family History  Problem Relation Age of Onset   Stroke Mother    Heart attack Mother    Heart disease Mother    Colon cancer Father    Colon cancer Sister    Breast cancer Sister    Heart attack Brother    Stroke Brother    Diabetes Brother    Diabetes Brother    Diabetes Daughter    Renal Disease Daughter        on HD   Kidney failure Daughter        age 15 as of 12/03/20   Anesthesia problems Neg Hx    Hypotension Neg Hx    Malignant hyperthermia Neg Hx    Pseudochol deficiency Neg Hx     ROS: Otherwise negative unless mentioned in HPI  Physical Examination  Vitals:  05/20/23 0509 05/20/23 0721  BP:  (!) 147/44  Pulse:  70  Resp:  16  Temp: 98.3 F (36.8 C)   SpO2:  99%   Body mass index is 22.87 kg/m.  General:  WDWN in NAD Gait: Not observed HENT: WNL, normocephalic Pulmonary: normal non-labored breathing, without Rales, rhonchi,  wheezing Cardiac: regular, without  Murmurs, rubs or gallops; without carotid bruits Abdomen: Positive bowel sounds,  soft, NT/ND, no masses Skin: without rashes Vascular Exam/Pulses: Femoral pulses in right lower extremity only. Knee to toes are cold to touch. Left lower extremity with palpable pulses and warm to touch Extremities: with ischemic changes, with Gangrene , with cellulitis; with open wounds;  Musculoskeletal: Complete muscle wasting or atrophy with failure to thrive.   Neurologic: A&O X 3;  No focal weakness or paresthesias are detected; speech is fluent/normal Psychiatric:  The pt has Normal affect. Lymph:   Unremarkable  CBC    Component Value Date/Time   WBC 4.0 05/20/2023 0051   RBC 2.29 (L) 05/20/2023 0051   HGB 7.5 (L) 05/20/2023 0051   HGB 12.2 05/12/2014 0741   HCT 22.8 (L) 05/20/2023 0051   HCT 38.9 05/12/2014 0741   PLT 106 (L) 05/20/2023 0051   PLT 133 (L) 05/12/2014 0741   MCV 99.6 05/20/2023 0051   MCV 102 (H) 05/12/2014 0741   MCH 32.8 05/20/2023 0051   MCHC 32.9 05/20/2023 0051   RDW 17.2 (H) 05/20/2023 0051   RDW 16.0 (H) 05/12/2014 0741   LYMPHSABS 1.1 05/19/2023 1500   LYMPHSABS 2.4 02/15/2014 0837   MONOABS 0.4 05/19/2023 1500   MONOABS 0.5 02/15/2014 0837   EOSABS 0.1 05/19/2023 1500   EOSABS 0.2 02/15/2014 0837   BASOSABS 0.0 05/19/2023 1500   BASOSABS 0.0 02/15/2014 0837    BMET    Component Value Date/Time   NA 131 (L) 05/20/2023 0051   NA 143 07/27/2015 1737   NA 137 05/14/2014 0741   K 3.4 (L) 05/20/2023 0051   K 3.3 (L) 05/14/2014 1500   CL 96 (L) 05/20/2023 0051   CL 98 05/14/2014 0741   CO2 25 05/20/2023 0051   CO2 35 (H) 05/14/2014 0741   GLUCOSE 135 (H) 05/20/2023 0051   GLUCOSE 96 05/14/2014 0741   BUN 24 (H) 05/20/2023 0051   BUN 25 (H) 05/14/2014 0741   CREATININE 1.41 (H) 05/20/2023 0051   CREATININE 5.83 (H) 05/14/2014 0741   CALCIUM 8.2 (L) 05/20/2023 0051   CALCIUM 8.9 05/14/2014 0741   GFRNONAA 41 (L) 05/20/2023 0051   GFRNONAA 8 (L) 05/14/2014 0741   GFRNONAA 4 (L) 02/15/2014 0837   GFRAA 7 (L) 01/12/2018 1638   GFRAA 10 (L) 05/14/2014 0741   GFRAA 5 (L) 02/15/2014 0837    COAGS: Lab Results  Component Value Date   INR 1.1 05/19/2023   INR 1.00 03/11/2017   INR 1.17 05/02/2015     Non-Invasive Vascular Imaging:   EXAM:05/19/23 RIGHT FOOT COMPLETE - 3+ VIEW   COMPARISON:  Right foot radiographs 02/13/2015   FINDINGS: There is diffuse decreased bone mineralization. Interval marked attenuation of the forefoot soft tissues, especially around all of the toes. Moderate to severe first through fifth  interphalangeal joint space narrowing, subchondral sclerosis, and peripheral osteophytosis. Moderate great toe metatarsophalangeal joint space narrowing. Tiny plantar calcaneal heel spur. Mild dorsal talar neck degenerative osteophytosis.   No definite cortical erosion is identified. No acute fracture or dislocation. Moderate atherosclerotic calcifications.   IMPRESSION: 1. Interval marked attenuation of  the forefoot soft tissues, especially around all of the toes. 2. 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 3. Moderate great toe metatarsophalangeal joint osteoarthritis. 4. No definite cortical erosion is identified.  Statin:  Yes.   Beta Blocker:  No. Aspirin:  No. ACEI:  No. ARB:  No. CCB use:  No Other antiplatelets/anticoagulants:  No.    ASSESSMENT/PLAN: This is a 67 y.o. female who presents to [facility] emergency department from a group home in which she was living for right lower extremity gangrene.  After examination is obvious the patient will need an angiogram to determine what type of amputation she will need.  At this time upon examination her right leg is cold from the knee down and she does not ambulate well.  An above-the-knee amputation on the right would be best.  Vascular surgery plans on taking the patient to the vascular lab later today 05/20/2023 for right lower extremity angiogram with possible intervention.  Plan is to determine what type of surgery/AKA or BKA she will need.  I discussed in detail with the patient this morning the procedure, benefits, risk, and complications.  Patient verbalizes her understanding and wishes to proceed.  I answered all the patient's questions this morning.  Patient has been n.p.o. since midnight last night.   -I discussed the case in detail with Dr. [name] MD and he agrees with plan.   Marcie Bal [facility] 05/20/2023 7:23 AM

## 2023-05-20 NOTE — Op Note (Signed)
[facility]  Percutaneous Study/Intervention Procedural Note   Date of Surgery: 05/20/2023  Surgeon(s):Kayzlee Wirtanen    Assistants:none  Pre-operative Diagnosis: PAD with gangrene right foot and lower leg  Post-operative diagnosis:  Same  Procedure(s) Performed:             1.  Ultrasound guidance for vascular access left femoral artery             2.  Catheter placement into right common femoral artery from left femoral approach             3.  Aortogram and selective right lower extremity angiogram             4.  Percutaneous transluminal angioplasty of right SFA and popliteal artery with 2 inflations with a 5 mm diameter by 30 cm length Lutonix drug-coated angioplasty balloon             5.  StarClose closure device left femoral artery  EBL: 5 cc  Contrast: 35 cc  Fluoro Time: 2.6 minutes  Moderate Conscious Sedation Time: approximately 30 minutes using 3.5 mg of Versed and 125 mcg of Fentanyl              Indications:  Patient is a 67 y.o.female with a clearly gangrenous right foot and wound on the right lateral calf with dark eschar and dead tissue as well.  She is going to require an amputation, but it is not clear that she has enough blood flow to heal even above-knee amputation given her recent femoral endarterectomy and right iliac stent placement at [facility] about 3 months ago.  Interestingly, she still has staples in the wound from 3 months ago.  The patient is brought in for angiography for further evaluation and potential treatment.  Due to the limb threatening nature of the situation, angiogram was performed to determine the level of amputation and determine if she would even heal an above-knee amputation and try to promote this.  It is unlikely she would be able to heal anything below and above-knee amputation distally due to the dead tissue already present in the foot and lower leg. The patient is aware that amputation would be expected.  The patient also  understands that even with successful revascularization, amputation may still be required due to the severity of the situation.  Risks and benefits are discussed and informed consent is obtained.   Procedure:  The patient was identified and appropriate procedural time out was performed.  The patient was then placed supine on the table and prepped and draped in the usual sterile fashion. Moderate conscious sedation was administered during a face to face encounter with the patient throughout the procedure with my supervision of the RN administering medicines and monitoring the patient's vital signs, pulse oximetry, telemetry and mental status throughout from the start of the procedure until the patient was taken to the recovery room. Ultrasound was used to evaluate the left common femoral artery.  It was patent .  A digital ultrasound image was acquired.  A Seldinger needle was used to access the left common femoral artery under direct ultrasound guidance and a permanent image was performed.  A 0.035 J wire was advanced without resistance and a 5Fr sheath was placed.  Pigtail catheter was placed into the aorta and an AP aortogram was performed. This demonstrated normal renal arteries and fairly normal aorta.  The left common iliac artery had moderate stenosis in the 60% range in the proximal left  external iliac artery had about a 70 to 80% stenosis.  The right common iliac artery had been stented previously and the stent was widely patent.  According to the previous description there was not much left iliac disease, but on the images today there is definitely some narrowing in the left iliac artery and the right iliac artery was significantly larger.  Whether or not there was subsequent narrowing of the left iliac artery after the right iliac stent was not clear to me. I then crossed the aortic bifurcation and advanced to the right femoral head. Selective right lower extremity angiogram was then performed. This  demonstrated the right femoral endarterectomy to be patent.  The distal profunda femoris artery was small and heavily diseased with poor collateral flow distally.  The SFA and popliteal arteries had multiple areas of significant disease.  There was a 70 to 80% stenosis in the proximal SFA, a greater than 90% stenosis in the mid to distal SFA, and a greater than 90% stenosis in the above-knee popliteal artery.  The mid and distal popliteal artery normalized and there appeared to be two-vessel runoff distally with a large anterior tibial artery is the dominant runoff. It was felt that it was in the patient's best interest to proceed with intervention after these images to avoid a second procedure and a larger amount of contrast and fluoroscopy based off of the findings from the initial angiogram.  Particularly with the disease profunda femoris artery, trying to revascularize the SFA and proximal popliteal artery makes sense to try to ensure healing of an above-knee amputation.  The patient was systemically heparinized and a 6 [place] [employer] sheath was then placed over the [employer] wire. I then used a Kumpe catheter and the advantage wire to navigate through the SFA and popliteal lesions then exchanged for a V18 wire.  I then selected a 5 mm diameter by 30 cm length Lutonix drug-coated angioplasty balloon to address the left SFA and popliteal arteries.  2 inflations were made and these were 8 to 10 atm for 1 minute from the mid popliteal artery up to the origin of the SFA.  Completion imaging showed the 2 SFA lesions to now be widely patent the above-knee popliteal artery still had some residual stenosis in the 50% range, this was near the level of the amputation would be expected and I did not feel that placing a stent there would be of any benefit.  I was also very concerned about stent placement because the patient was somewhat combative and broke our sterility at least twice during the procedure and I tried  to avoid a new stent placement in either leg including the left iliac lesions. I elected to terminate the procedure. The sheath was removed and StarClose closure device was deployed in the left femoral artery with excellent hemostatic result. The patient was taken to the recovery room in stable condition having tolerated the procedure well.  Findings:               Aortogram:  This demonstrated normal renal arteries and fairly normal aorta.  The left common iliac artery had moderate stenosis in the 60% range in the proximal left external iliac artery had about a 70 to 80% stenosis.  The right common iliac artery had been stented previously and the stent was widely patent.  According to the previous description there was not much left iliac disease, but on the images today there is definitely some narrowing in the left iliac  artery and the right iliac artery was significantly larger.  Whether or not there was subsequent narrowing of the left iliac artery after the right iliac stent was not clear to me             Right lower Extremity:  This demonstrated the right femoral endarterectomy to be patent.  The distal profunda femoris artery was small and heavily diseased with poor collateral flow distally.  The SFA and popliteal arteries had multiple areas of significant disease.  There was a 70 to 80% stenosis in the proximal SFA, a greater than 90% stenosis in the mid to distal SFA, and a greater than 90% stenosis in the above-knee popliteal artery.  The mid and distal popliteal artery normalized and there appeared to be two-vessel runoff distally with a large anterior tibial artery is the dominant runoff.   Disposition: Patient was taken to the recovery room in stable condition having tolerated the procedure well.  Complications: None  [name] 05/20/2023 1:34 PM   This note was created with Dragon Medical transcription system. Any errors in dictation are purely unintentional.

## 2023-05-20 NOTE — ED Notes (Signed)
Lab notified of need for venipuncture assist for labs.

## 2023-05-20 NOTE — ED Notes (Signed)
Pt repositioned and turned to right side from left side. Pillow between knees. Pt declines to place hospital gown.

## 2023-05-20 NOTE — ED Notes (Signed)
Pt continues to rest. 

## 2023-05-20 NOTE — ED Notes (Signed)
Report to [place], rn

## 2023-05-21 ENCOUNTER — Inpatient Hospital Stay: Payer: Medicare Other | Admitting: Anesthesiology

## 2023-05-21 ENCOUNTER — Encounter: Payer: Self-pay | Admitting: Vascular Surgery

## 2023-05-21 ENCOUNTER — Encounter: Admission: EM | Disposition: E | Payer: Self-pay | Source: Home / Self Care | Attending: Student

## 2023-05-21 ENCOUNTER — Other Ambulatory Visit: Payer: Self-pay

## 2023-05-21 DIAGNOSIS — Z940 Kidney transplant status: Secondary | ICD-10-CM | POA: Diagnosis not present

## 2023-05-21 DIAGNOSIS — L97509 Non-pressure chronic ulcer of other part of unspecified foot with unspecified severity: Secondary | ICD-10-CM | POA: Diagnosis not present

## 2023-05-21 DIAGNOSIS — E871 Hypo-osmolality and hyponatremia: Secondary | ICD-10-CM | POA: Diagnosis not present

## 2023-05-21 DIAGNOSIS — I70261 Atherosclerosis of native arteries of extremities with gangrene, right leg: Secondary | ICD-10-CM | POA: Diagnosis not present

## 2023-05-21 DIAGNOSIS — N1832 Chronic kidney disease, stage 3b: Secondary | ICD-10-CM | POA: Diagnosis not present

## 2023-05-21 DIAGNOSIS — E1152 Type 2 diabetes mellitus with diabetic peripheral angiopathy with gangrene: Secondary | ICD-10-CM | POA: Diagnosis not present

## 2023-05-21 DIAGNOSIS — E11621 Type 2 diabetes mellitus with foot ulcer: Secondary | ICD-10-CM | POA: Diagnosis not present

## 2023-05-21 HISTORY — PX: AMPUTATION: SHX166

## 2023-05-21 LAB — CBC
HCT: 22.2 % — ABNORMAL LOW (ref 36.0–46.0)
HCT: 32.1 % — ABNORMAL LOW (ref 36.0–46.0)
Hemoglobin: 7.1 g/dL — ABNORMAL LOW (ref 12.0–15.0)
Hemoglobin: 10.6 g/dL — ABNORMAL LOW (ref 12.0–15.0)
MCH: 32 pg (ref 26.0–34.0)
MCH: 31.9 pg (ref 26.0–34.0)
MCHC: 32 g/dL (ref 30.0–36.0)
MCHC: 33 g/dL (ref 30.0–36.0)
MCV: 100 fL (ref 80.0–100.0)
MCV: 96.7 fL (ref 80.0–100.0)
Platelets: 103 10*3/uL — ABNORMAL LOW (ref 150–400)
Platelets: 86 10*3/uL — ABNORMAL LOW (ref 150–400)
RBC: 2.22 MIL/uL — ABNORMAL LOW (ref 3.87–5.11)
RBC: 3.32 MIL/uL — ABNORMAL LOW (ref 3.87–5.11)
RDW: 17.6 % — ABNORMAL HIGH (ref 11.5–15.5)
RDW: 16.9 % — ABNORMAL HIGH (ref 11.5–15.5)
WBC: 4.8 10*3/uL (ref 4.0–10.5)
WBC: 5.3 10*3/uL (ref 4.0–10.5)
nRBC: 0 % (ref 0.0–0.2)
nRBC: 0 % (ref 0.0–0.2)

## 2023-05-21 LAB — GLUCOSE, CAPILLARY
Glucose-Capillary: 88 mg/dL (ref 70–99)
Glucose-Capillary: 327 mg/dL — ABNORMAL HIGH (ref 70–99)
Glucose-Capillary: 169 mg/dL — ABNORMAL HIGH (ref 70–99)
Glucose-Capillary: 150 mg/dL — ABNORMAL HIGH (ref 70–99)
Glucose-Capillary: 142 mg/dL — ABNORMAL HIGH (ref 70–99)
Glucose-Capillary: 142 mg/dL — ABNORMAL HIGH (ref 70–99)

## 2023-05-21 LAB — POCT I-STAT, CHEM 8
BUN: 18 mg/dL (ref 8–23)
Calcium, Ion: 1.21 mmol/L (ref 1.15–1.40)
Chloride: 101 mmol/L (ref 98–111)
Creatinine, Ser: 1.4 mg/dL — ABNORMAL HIGH (ref 0.44–1.00)
Glucose, Bld: 266 mg/dL — ABNORMAL HIGH (ref 70–99)
HCT: 21 % — ABNORMAL LOW (ref 36.0–46.0)
Hemoglobin: 7.1 g/dL — ABNORMAL LOW (ref 12.0–15.0)
Potassium: 5.1 mmol/L (ref 3.5–5.1)
Sodium: 134 mmol/L — ABNORMAL LOW (ref 135–145)
TCO2: 22 mmol/L (ref 22–32)

## 2023-05-21 LAB — BASIC METABOLIC PANEL
Anion gap: 7 (ref 5–15)
BUN: 21 mg/dL (ref 8–23)
CO2: 25 mmol/L (ref 22–32)
Calcium: 8.5 mg/dL — ABNORMAL LOW (ref 8.9–10.3)
Chloride: 104 mmol/L (ref 98–111)
Creatinine, Ser: 1.34 mg/dL — ABNORMAL HIGH (ref 0.44–1.00)
GFR, Estimated: 44 mL/min — ABNORMAL LOW (ref >60)
Glucose, Bld: 84 mg/dL (ref 70–99)
Potassium: 5.8 mmol/L — ABNORMAL HIGH (ref 3.5–5.1)
Sodium: 136 mmol/L (ref 135–145)

## 2023-05-21 LAB — SURGICAL PCR SCREEN
MRSA, PCR: NEGATIVE
Staphylococcus aureus: NEGATIVE

## 2023-05-21 LAB — PREPARE RBC (CROSSMATCH)

## 2023-05-21 LAB — MAGNESIUM: Magnesium: 1.9 mg/dL (ref 1.7–2.4)

## 2023-05-21 SURGERY — AMPUTATION, ABOVE KNEE
Anesthesia: General | Site: Knee | Laterality: Right

## 2023-05-21 MED ORDER — VITAMIN C 500 MG PO TABS
500.0000 mg | ORAL_TABLET | Freq: Two times a day (BID) | ORAL | Status: DC
  Administered 2023-05-21 – 2023-06-07 (×28): 500 mg via ORAL
  Filled 2023-05-21 (×33): qty 1

## 2023-05-21 MED ORDER — FENTANYL CITRATE (PF) 100 MCG/2ML IJ SOLN
INTRAMUSCULAR | Status: AC
  Filled 2023-05-21: qty 2

## 2023-05-21 MED ORDER — ACETAMINOPHEN 10 MG/ML IV SOLN
1000.0000 mg | Freq: Once | INTRAVENOUS | Status: AC
  Administered 2023-05-21: 1000 mg via INTRAVENOUS

## 2023-05-21 MED ORDER — ONDANSETRON HCL 4 MG/2ML IJ SOLN
INTRAMUSCULAR | Status: AC
  Filled 2023-05-21: qty 2

## 2023-05-21 MED ORDER — EPHEDRINE 5 MG/ML INJ
INTRAVENOUS | Status: AC
  Filled 2023-05-21: qty 5

## 2023-05-21 MED ORDER — MEDIHONEY WOUND/BURN DRESSING EX PSTE
1.0000 | PASTE | Freq: Every day | CUTANEOUS | Status: DC
Start: 2023-05-21 — End: 2023-06-08
  Administered 2023-05-21 – 2023-06-07 (×16): 1 via TOPICAL
  Filled 2023-05-21 (×2): qty 44

## 2023-05-21 MED ORDER — PHENYLEPHRINE 80 MCG/ML (10ML) SYRINGE FOR IV PUSH (FOR BLOOD PRESSURE SUPPORT)
PREFILLED_SYRINGE | INTRAVENOUS | Status: DC | PRN
  Administered 2023-05-21 (×2): 160 ug via INTRAVENOUS
  Administered 2023-05-21: 80 ug via INTRAVENOUS
  Administered 2023-05-21: 160 ug via INTRAVENOUS

## 2023-05-21 MED ORDER — LIDOCAINE HCL (CARDIAC) PF 100 MG/5ML IV SOSY
PREFILLED_SYRINGE | INTRAVENOUS | Status: DC | PRN
  Administered 2023-05-21: 60 mg via INTRAVENOUS

## 2023-05-21 MED ORDER — DEXTROSE 50 % IV SOLN
INTRAVENOUS | Status: AC
  Filled 2023-05-21: qty 100

## 2023-05-21 MED ORDER — HYDROMORPHONE HCL 1 MG/ML IJ SOLN
0.5000 mg | INTRAMUSCULAR | Status: DC | PRN
Start: 2023-05-21 — End: 2023-05-21
  Administered 2023-05-21 (×2): 0.5 mg via INTRAVENOUS

## 2023-05-21 MED ORDER — LIDOCAINE HCL (PF) 2 % IJ SOLN
INTRAMUSCULAR | Status: AC
  Filled 2023-05-21: qty 5

## 2023-05-21 MED ORDER — EPHEDRINE SULFATE-NACL 50-0.9 MG/10ML-% IV SOSY
PREFILLED_SYRINGE | INTRAVENOUS | Status: DC | PRN
  Administered 2023-05-21: 10 mg via INTRAVENOUS

## 2023-05-21 MED ORDER — OXYCODONE HCL 5 MG PO TABS
5.0000 mg | ORAL_TABLET | ORAL | Status: DC | PRN
  Administered 2023-05-21 – 2023-05-22 (×3): 5 mg via ORAL
  Filled 2023-05-21 (×3): qty 1

## 2023-05-21 MED ORDER — PHENYLEPHRINE HCL-NACL 20-0.9 MG/250ML-% IV SOLN
INTRAVENOUS | Status: AC
  Filled 2023-05-21: qty 250

## 2023-05-21 MED ORDER — PROPOFOL 10 MG/ML IV BOLUS
INTRAVENOUS | Status: DC | PRN
  Administered 2023-05-21: 100 mg via INTRAVENOUS

## 2023-05-21 MED ORDER — DEXAMETHASONE SODIUM PHOSPHATE 10 MG/ML IJ SOLN
INTRAMUSCULAR | Status: AC
  Filled 2023-05-21: qty 1

## 2023-05-21 MED ORDER — SODIUM CHLORIDE 0.9 % IV SOLN: INTRAVENOUS | Status: DC

## 2023-05-21 MED ORDER — ACETAMINOPHEN 10 MG/ML IV SOLN
INTRAVENOUS | Status: AC
  Filled 2023-05-21: qty 100

## 2023-05-21 MED ORDER — FENTANYL CITRATE (PF) 100 MCG/2ML IJ SOLN
INTRAMUSCULAR | Status: DC | PRN
  Administered 2023-05-21 (×2): 25 ug via INTRAVENOUS

## 2023-05-21 MED ORDER — CHLORHEXIDINE GLUCONATE 4 % EX SOLN
60.0000 mL | Freq: Once | CUTANEOUS | Status: AC
  Administered 2023-05-21: 4 via TOPICAL

## 2023-05-21 MED ORDER — HYDROMORPHONE HCL 1 MG/ML IJ SOLN
INTRAMUSCULAR | Status: AC
  Filled 2023-05-21: qty 1

## 2023-05-21 MED ORDER — PROCHLORPERAZINE EDISYLATE 10 MG/2ML IJ SOLN
5.0000 mg | Freq: Once | INTRAMUSCULAR | Status: AC
  Administered 2023-05-21: 5 mg via INTRAVENOUS
  Filled 2023-05-21: qty 1

## 2023-05-21 MED ORDER — OXYCODONE HCL 5 MG/5ML PO SOLN: 5.0000 mg | Freq: Once | ORAL | Status: DC | PRN

## 2023-05-21 MED ORDER — CEFAZOLIN SODIUM-DEXTROSE 2-4 GM/100ML-% IV SOLN
INTRAVENOUS | Status: AC
  Filled 2023-05-21: qty 100

## 2023-05-21 MED ORDER — FENTANYL CITRATE (PF) 100 MCG/2ML IJ SOLN
25.0000 ug | INTRAMUSCULAR | Status: DC | PRN
  Administered 2023-05-21: 50 ug via INTRAVENOUS
  Administered 2023-05-21 (×2): 25 ug via INTRAVENOUS

## 2023-05-21 MED ORDER — OXYCODONE HCL 5 MG PO TABS: 5.0000 mg | ORAL_TABLET | Freq: Once | ORAL | Status: DC | PRN

## 2023-05-21 MED ORDER — ADULT MULTIVITAMIN W/MINERALS CH
1.0000 | ORAL_TABLET | Freq: Every day | ORAL | Status: DC
  Administered 2023-05-21 – 2023-06-07 (×16): 1 via ORAL
  Filled 2023-05-21 (×19): qty 1

## 2023-05-21 MED ORDER — ZINC SULFATE 220 (50 ZN) MG PO CAPS
220.0000 mg | ORAL_CAPSULE | Freq: Every day | ORAL | Status: AC
  Administered 2023-05-21 – 2023-06-03 (×12): 220 mg via ORAL
  Filled 2023-05-21 (×14): qty 1

## 2023-05-21 MED ORDER — DEXTROSE 50 % IV SOLN
2.0000 | Freq: Once | INTRAVENOUS | Status: AC
  Administered 2023-05-21: 100 mL via INTRAVENOUS

## 2023-05-21 MED ORDER — PHENYLEPHRINE 80 MCG/ML (10ML) SYRINGE FOR IV PUSH (FOR BLOOD PRESSURE SUPPORT)
PREFILLED_SYRINGE | INTRAVENOUS | Status: AC
  Filled 2023-05-21: qty 10

## 2023-05-21 MED ORDER — CHLORHEXIDINE GLUCONATE CLOTH 2 % EX PADS
6.0000 | MEDICATED_PAD | Freq: Every day | CUTANEOUS | Status: DC
  Administered 2023-05-21 – 2023-06-02 (×13): 6 via TOPICAL

## 2023-05-21 MED ORDER — INSULIN ASPART 100 UNIT/ML IJ SOLN
10.0000 [IU] | INTRAMUSCULAR | Status: AC
  Administered 2023-05-21: 10 [IU] via INTRAVENOUS
  Filled 2023-05-21 (×2): qty 0.1

## 2023-05-21 MED ORDER — MORPHINE SULFATE (PF) 2 MG/ML IV SOLN
2.0000 mg | INTRAVENOUS | Status: DC | PRN
  Administered 2023-05-21: 2 mg via INTRAVENOUS
  Filled 2023-05-21: qty 1

## 2023-05-21 MED ORDER — BOOST / RESOURCE BREEZE PO LIQD CUSTOM
1.0000 | Freq: Three times a day (TID) | ORAL | Status: DC
  Administered 2023-05-21 – 2023-06-07 (×27): 1 via ORAL

## 2023-05-21 MED ORDER — CEFAZOLIN SODIUM-DEXTROSE 2-4 GM/100ML-% IV SOLN
2.0000 g | INTRAVENOUS | Status: AC
  Administered 2023-05-21: 2 g via INTRAVENOUS

## 2023-05-21 MED ORDER — 0.9 % SODIUM CHLORIDE (POUR BTL) OPTIME
TOPICAL | Status: DC | PRN
  Administered 2023-05-21: 750 mL

## 2023-05-21 MED ORDER — PROSOURCE PLUS PO LIQD
30.0000 mL | Freq: Three times a day (TID) | ORAL | Status: DC
Start: 2023-05-21 — End: 2023-05-25
  Administered 2023-05-21 – 2023-05-22 (×3): 30 mL via ORAL
  Filled 2023-05-21 (×12): qty 30

## 2023-05-21 MED ORDER — CHLORHEXIDINE GLUCONATE 4 % EX SOLN
60.0000 mL | Freq: Once | CUTANEOUS | Status: AC
  Administered 2023-05-22: 4 via TOPICAL

## 2023-05-21 MED ORDER — INSULIN ASPART 100 UNIT/ML IJ SOLN: 10.0000 [IU] | Freq: Once | INTRAMUSCULAR | Status: DC

## 2023-05-21 MED ORDER — SODIUM CHLORIDE 0.9% IV SOLUTION
Freq: Once | INTRAVENOUS | Status: DC
Start: 2023-05-21 — End: 2023-05-21

## 2023-05-21 MED ORDER — PROPOFOL 10 MG/ML IV BOLUS
INTRAVENOUS | Status: AC
  Filled 2023-05-21: qty 20

## 2023-05-21 SURGICAL SUPPLY — 35 items
BLADE SAGITTAL WIDE XTHICK NO (BLADE) ×1 IMPLANT
BNDG COHESIVE 4X5 TAN STRL LF (GAUZE/BANDAGES/DRESSINGS) ×1 IMPLANT
BNDG ELASTIC 6X5.8 VLCR NS LF (GAUZE/BANDAGES/DRESSINGS) ×1 IMPLANT
BNDG GAUZE DERMACEA FLUFF 4 (GAUZE/BANDAGES/DRESSINGS) ×2 IMPLANT
BRUSH SCRUB EZ 4% CHG (MISCELLANEOUS) ×1 IMPLANT
CHLORAPREP W/TINT 26 (MISCELLANEOUS) ×1 IMPLANT
DRAPE INCISE IOBAN 66X45 STRL (DRAPES) ×1 IMPLANT
DRAPE INCISE IOBAN 66X60 STRL (DRAPES) ×1 IMPLANT
DRSG XEROFORM 1X8 (GAUZE/BANDAGES/DRESSINGS) IMPLANT
ELECT CAUTERY BLADE 6.4 (BLADE) ×1 IMPLANT
ELECT REM PT RETURN 9FT ADLT (ELECTROSURGICAL) ×1
ELECTRODE REM PT RTRN 9FT ADLT (ELECTROSURGICAL) ×1 IMPLANT
GAUZE SPONGE 4X4 12PLY STRL (GAUZE/BANDAGES/DRESSINGS) IMPLANT
GAUZE XEROFORM 1X8 LF (GAUZE/BANDAGES/DRESSINGS) ×2 IMPLANT
GLOVE BIO SURGEON STRL SZ7 (GLOVE) ×2 IMPLANT
GOWN STRL REUS W/ TWL LRG LVL3 (GOWN DISPOSABLE) ×2 IMPLANT
GOWN STRL REUS W/TWL 2XL LVL3 (GOWN DISPOSABLE) ×1 IMPLANT
HANDLE YANKAUER SUCT BULB TIP (MISCELLANEOUS) ×1 IMPLANT
KIT TURNOVER KIT A (KITS) ×1 IMPLANT
LABEL OR SOLS (LABEL) ×1 IMPLANT
MANIFOLD NEPTUNE II (INSTRUMENTS) ×1 IMPLANT
MAT ABSORB FLUID 56X50 GRAY (MISCELLANEOUS) ×1 IMPLANT
NS IRRIG 1000ML POUR BTL (IV SOLUTION) ×1 IMPLANT
PACK EXTREMITY ARMC (MISCELLANEOUS) ×1 IMPLANT
PAD ABD DERMACEA PRESS 5X9 (GAUZE/BANDAGES/DRESSINGS) ×2 IMPLANT
PAD PREP OB/GYN DISP 24X41 (PERSONAL CARE ITEMS) ×1 IMPLANT
SPONGE T-LAP 18X18 ~~LOC~~+RFID (SPONGE) ×2 IMPLANT
STAPLER SKIN PROX 35W (STAPLE) ×1 IMPLANT
STOCKINETTE M/LG 89821 (MISCELLANEOUS) ×1 IMPLANT
SUT SILK 2 0 SH (SUTURE) ×2 IMPLANT
SUT SILK 2-0 18XBRD TIE 12 (SUTURE) ×1 IMPLANT
SUT VIC AB 0 CT1 36 (SUTURE) ×2 IMPLANT
SUT VIC AB 2-0 CT1 (SUTURE) ×2 IMPLANT
TRAP FLUID SMOKE EVACUATOR (MISCELLANEOUS) ×1 IMPLANT
WATER STERILE IRR 500ML POUR (IV SOLUTION) ×1 IMPLANT

## 2023-05-21 NOTE — Interval H&P Note (Signed)
History and Physical Interval Note:  05/21/2023 10:47 AM  Theresa [name]  has presented today for surgery, with the diagnosis of GANGRENE RIGHT FOOT.  The various methods of treatment have been discussed with the patient and family. After consideration of risks, benefits and other options for treatment, the patient has consented to  Procedure(s): AMPUTATION ABOVE KNEE (Right) as a surgical intervention.  The patient's history has been reviewed, patient examined, no change in status, stable for surgery.  I have reviewed the patient's chart and labs.  Questions were answered to the patient's satisfaction.     [name]

## 2023-05-21 NOTE — Transfer of Care (Signed)
Immediate Anesthesia Transfer of Care Note  Patient: Theresa [name]  Procedure(s) Performed: AMPUTATION ABOVE KNEE (Right: Knee)  Patient Location: PACU  Anesthesia Type:General  Level of Consciousness: awake and alert   Airway & Oxygen Therapy: Patient Spontanous Breathing and Patient connected to face mask oxygen  Post-op Assessment: Report given to RN and Post -op Vital signs reviewed and stable  Post vital signs: Reviewed and stable  Last Vitals:  Vitals Value Taken Time  BP 109/38 05/21/23 1406  Temp    Pulse 91 05/21/23 1414  Resp 19 05/21/23 1410  SpO2 100 % 05/21/23 1414  Vitals shown include unfiled device data.  Last Pain:  Vitals:   05/21/23 1215  TempSrc: Axillary  PainSc:          Complications: No notable events documented.

## 2023-05-21 NOTE — Anesthesia Procedure Notes (Signed)
Procedure Name: LMA Insertion Date/Time: 05/21/2023 12:51 PM  Performed by: Ginger Carne, CRNAPre-anesthesia Checklist: Patient identified, Emergency Drugs available, Suction available, Patient being monitored and Timeout performed Patient Re-evaluated:Patient Re-evaluated prior to induction Oxygen Delivery Method: Circle system utilized Preoxygenation: Pre-oxygenation with 100% oxygen Induction Type: IV induction LMA: LMA inserted LMA Size: 4.0 Tube type: Oral Number of attempts: 1 Placement Confirmation: positive ETCO2 and breath sounds checked- equal and bilateral Tube secured with: Tape Dental Injury: Teeth and Oropharynx as per pre-operative assessment

## 2023-05-21 NOTE — Progress Notes (Signed)
Progress Note   Patient: Theresa [name] YNW:[id number] DOB: 06-26-1955 DOA: 05/19/2023     2 DOS: the patient was seen and examined on 05/21/2023   Brief hospital course: HPI on admission: "[name] is a 67 y.o. female with medical history significant of  Anemia of chronic disease, COPD coronary artery disease status post stent placement, Diabetes mellitus with diabetic neuropathy and retinopathy, previous history of ESRD on hemodialysis currently status post transplant, hyperlipidemia, Hypertension moderate to severe mitral regurgitation, peripheral vascular disease who is a resident in a group home brought in on account of worsening gangrene noted on the right foot.  ..." See H&P for full HPI on admission & ED course.  Patient was admitted to the hospital, started on IV heparin and Vascular Surgery was consulted, plan for angiogram today (12/11).  Further hospital course and management as outlined below.   Assessment and Plan:  Right foot gangrene No signs of infection identified, no antibiotics indicated at this time --Initially on heparin drip, now stopped --Vascular surgery following --12/11 -- Pt underwent angiogram with angioplasty to right SFA and popliteal arteries --12/12 -- taking to OR for amputation today --NPO since midnight, resume diet after surgery --Follow up Vascular's recommendations   Hypokalemia - resolved with replacement --Monitor BMP, replace K PRN   Hypovolemic hyponatremia - mild, Na 132 on admission.  Pt appeared clinically dehydrated. Na 134 today --Encourage oral hydration --Monitor BMP   Anemia of chronic disease - Hbg stable --Monitor CBC   COPD - stable, not exacerbated --PRN bronchodilators   Coronary artery disease status post stent placement --Continue statin therapy   Diabetes mellitus with diabetic neuropathy and retinopathy --Monitor CBG's --Sliding scale Novolog --Continue Lyrica   History of ESRD  Patient used to be on on  hemodialysis currently status post transplant No longer on hemodialysis Renal function currently at baseline Resumed home dose of tacrolimus I will consult nephrology given history of transplants with rejection medication   Severe hypoalbuminemia Dietitian consulted   Hyperlipidemia Hypertension moderate to severe mitral regurgitation Peripheral vascular disease      Subjective: Pt awake resting in bed, seen before surgery today.  She denies pain or fever/chills.  She does confirm being followed by hospice since her prior discharge, and states she wants to continue to be followed by hospice going forward.  No other acute complaints or issues.     Physical Exam: Vitals:   05/21/23 0453 05/21/23 0742 05/21/23 1019 05/21/23 1215  BP: (!) 166/62 (!) 184/59 (!) 176/59 (!) 155/70  Pulse: 84 87 83   Resp: 13 16 16 15   Temp: 98.4 F (36.9 C) 99 F (37.2 C) 97.6 F (36.4 C) (!) 97.5 F (36.4 C)  TempSrc: Oral Oral Temporal Axillary  SpO2: 100% 100% 100% 100%  Weight:   65 kg   Height:   5\' 7"  (1.702 m)    General exam: awake, alert, no acute distress, chronically ill appearing HEENT: moist mucus membranes, hearing grossly normal  Respiratory system: CTAB, no wheezes, rales or rhonchi, normal respiratory effort. Cardiovascular system: normal S1/S2, RRR, no JVD, murmurs, rubs, gallops, no pedal edema.   Gastrointestinal system: soft, NT, ND, no HSM felt, +bowel sounds. Central nervous system: A&O x. no gross focal neurologic deficits, normal speech Extremities: right foot and distal RLE gangrene (images below), no edema, normal tone Psychiatry: normal mood, congruent affect, judgement and insight appear normal  Images taken on 05/20/23:     Data Reviewed:  Notable labs --  Na 131>> 134, glucose 266, Cr 1.40 from 1.34 stable Hbg 7.1 x 2 Platelets 103k   Family Communication: None present on rounds. Will attempt to call as time allows.   Disposition: Status is:  Inpatient Remains inpatient appropriate because: vascular surgery today, on IV therapies, ongoing evaluation   Planned Discharge Destination: return to prior home environment    Time spent: 40 minutes  Author: Pennie Banter, DO 05/21/2023 1:41 PM  For on call review [email or web address].

## 2023-05-21 NOTE — Anesthesia Preprocedure Evaluation (Signed)
Anesthesia Evaluation  Patient identified by MRN, date of birth, ID band Patient confused    Reviewed: Allergy & Precautions, NPO status , Patient's Chart, lab work & pertinent test results, reviewed documented beta blocker date and time   Airway Mallampati: III  TM Distance: >3 FB Neck ROM: full    Dental  (+) Chipped, Missing, Poor Dentition, Dental Advidsory Given   Pulmonary neg pulmonary ROS, COPD,  COPD inhaler, former smoker   Pulmonary exam normal        Cardiovascular hypertension, Pt. on medications and Pt. on home beta blockers + angina  + CAD, + Past MI (2014), + Cardiac Stents, + Peripheral Vascular Disease and +CHF  negative cardio ROS Normal cardiovascular exam+ Valvular Problems/Murmurs MR      Neuro/Psych  PSYCHIATRIC DISORDERS     Dementia  Neuromuscular disease negative neurological ROS  negative psych ROS   GI/Hepatic negative GI ROS, Neg liver ROS,,,  Endo/Other  negative endocrine ROSdiabetes, Well Controlled, Type 2, Oral Hypoglycemic Agents    Renal/GU S/p kidney tx in 2020     Musculoskeletal   Abdominal   Peds  Hematology negative hematology ROS (+) Blood dyscrasia, anemia   Anesthesia Other Findings Patient with a recheck potassium of 5.1. Patient confused today. Spoke with her son at bedside who states this is not her normal mental state. He consented to proceed with the procedure.  Past Medical History: No date: Anemia of chronic disease No date: Anginal pain (HCC) 04/08/2021: Breast calcification seen on mammogram No date: Carotid arterial disease (HCC)     Comment:  a. 02/2013 U/S: 40-59% bilat ICA stenosis. No date: Chronic constipation No date: Chronic diastolic CHF (congestive heart failure) (HCC)     Comment:  a. 10/2013 Echo [facility]): EF 55-60%, mod conc LVH, mod MR,               mildly dil LA, mild Ao sclerosis w/o stenosis; b. 02/2016               Echo: EF 55-60%, no rwma, Gr DD,  mild AS, mod to sev MR,               mildly dil LA, PASP ; c. 07/2017 Echo [facility]): EF               >55%, mild to mod LVH, Gr2 DD, Ao scl, Mod MR, mod dil               LA, nl RV fxn. No date: Colon polyps No date: COPD (chronic obstructive pulmonary disease) (HCC) No date: Coronary artery disease     Comment:  a. 05/2013 NSTEMI/PCI: LM 20d, LAD min irregs, LCX               small, nl, OM1 nl, RCA dom 8m (2.5x16 Promus DES), PDA1               80p; b. 04/2015 Cath: LM nl, LAD 15m, D1/2 min irregs,               LCX 35p/m, OM2/3 min irregs, RCA patent mid stent, RPDA               70ost, RPLB1 30, RPLB2/3 min irregs, EF 55-65%--> Med Rx;              c. 06/2017 MV [facility] No ischemia/infarct, EF 64%. 06/27/2013: Coronary artery disease of native artery of native heart  with stable angina pectoris (HCC)  Comment:  S/p drug eluting stent RCA 05/2013 No date: COVID-19     Comment:  06/2021 No date: Diabetes mellitus No date: Diabetic neuropathy (HCC) 05/28/2013: Diabetic retinopathy (HCC)     Comment:  Hx bilat retinal detachment, proliferative diab               retinopathy and bilat vitreous hemorrhage  No date: Emphysema No date: ESRD on hemodialysis (HCC)     Comment:  a. [facility] in [place], [place] [phone]/Dr. [name],               on a MWF schedule.  She started dialysis in Feb 2014.                Etiology of renal failure not known, likely diabetes.                Has a left upper arm AV graft. No date: History of bronchitis     Comment:  Mar 2012 01/05/2018: History of heart attack No date: History of pneumonia     Comment:  June 2012 No date: History of tobacco abuse     Comment:  a. Quit 2012. 02/27/2020: Hypercalcemia 04/18/2020: Hyperkalemia No date: Hyperlipidemia No date: Hypertension 05/22/2021: Hyponatremia No date: Moderate to severe mitral insufficiency     Comment:  a. 10/2013 Echo: EF 55-60%, mod MR; b. 02/2016 Echo: EF               55-60%, mod to  sev MR directed posteriorly--felt to be               dynamic -worse with volume overload; c. 07/2017 Echo               [facility]): EF >55%. Mod MR. 05/2013: Myocardial infarct (HCC) 11/28/2014: Neuritis or radiculitis due to rupture of lumbar  intervertebral disc No date: Peripheral vascular disease (HCC) No date: Sickle cell trait (HCC) No date: Thyroid nodule     Comment:  [place] 06/2017 due to f/u US in 1 year  No date: Valvular heart disease     Comment:  echo 06/17/21 mild to moderate  Past Surgical History: 11/10/2017: A/V FISTULAGRAM; Left     Comment:  Procedure: A/V FISTULAGRAM;  Surgeon: Renford Dills, MD;  Location: [facility] INVASIVE CV LAB;  Service:               Cardiovascular;  Laterality: Left; 02/08/2019: A/V SHUNTOGRAM; Left     Comment:  Procedure: A/V SHUNTOGRAM;  Surgeon: Renford Dills,              MD;  Location: [facility] INVASIVE CV LAB;  Service:               Cardiovascular;  Laterality: Left; No date: ABDOMINAL HYSTERECTOMY     Comment:  2000 for cysts on ovaries total no abnormal pap No date: CARDIAC CATHETERIZATION 05/02/2015: CARDIAC CATHETERIZATION; N/A     Comment:  Procedure: Left Heart Cath and Coronary Angiography;                Surgeon: Iran Ouch, MD;  Location: [facility] INVASIVE               CV LAB;  Service: Cardiovascular;  Laterality: N/A; 07/08/2016: COLONOSCOPY WITH PROPOFOL; N/A     Comment:  Procedure: COLONOSCOPY WITH PROPOFOL;  Surgeon: Sharlet Salina  Tobi Bastos, MD;  Location: [facility] ENDOSCOPY;  Service: Endoscopy;              Laterality: N/A; 08/20/2017: COLONOSCOPY WITH PROPOFOL; N/A     Comment:  Procedure: COLONOSCOPY WITH PROPOFOL;  Surgeon: Wyline Mood, MD;  Location: [facility] ENDOSCOPY;  Service:               Gastroenterology;  Laterality: N/A; 09/06/2021: COLONOSCOPY WITH PROPOFOL; N/A     Comment:  Procedure: COLONOSCOPY WITH PROPOFOL;  Surgeon: Wyline Mood, MD;  Location: [facility] ENDOSCOPY;   Service:               Gastroenterology;  Laterality: N/A; No date: colonscopy 05/28/2014: CORONARY ANGIOPLASTY     Comment:  stent placement to the mid RCA No date: CYSTOURETHROSCOPY     Comment:  [facility] urology with stent placement 11 or 05/2019 and stent              removal 06/13/19  No date: DILATION AND CURETTAGE OF UTERUS     Comment:  several in the early 80's No date: ESOPHAGOGASTRODUODENOSCOPY     Comment:  2012 09/06/2021: ESOPHAGOGASTRODUODENOSCOPY; N/A     Comment:  Procedure: ESOPHAGOGASTRODUODENOSCOPY (EGD);  Surgeon:               Wyline Mood, MD;  Location: [facility] ENDOSCOPY;  Service:               Gastroenterology;  Laterality: N/A; 03/11/2018: ESOPHAGOGASTRODUODENOSCOPY (EGD) WITH PROPOFOL; N/A     Comment:  Procedure: ESOPHAGOGASTRODUODENOSCOPY (EGD) WITH               PROPOFOL;  Surgeon: Wyline Mood, MD;  Location: [facility]               ENDOSCOPY;  Service: Gastroenterology;  Laterality: N/A; No date: EYE SURGERY     Comment:  bilateral laser 2012 No date: EYE SURGERY     Comment:  right No date: EYE SURGERY     Comment:  x4 both eyes 09/30/2011: GAS INSERTION     Comment:  Procedure: INSERTION OF GAS;  Surgeon: Sherrie George,               MD;  Location: [facility] OR;  Service: Ophthalmology;                Laterality: Right;  C3F8 09/30/2011: GAS/FLUID EXCHANGE     Comment:  Procedure: GAS/FLUID EXCHANGE;  Surgeon: Sherrie George, MD;  Location: [facility] OR;  Service: Ophthalmology;               Laterality: Right; No date: KIDNEY TRANSPLANT     Comment:  03/30/19 Dr. Vernona Rieger [place]/Dr. Trinna Post Zendel/Dr. Sundra Aland;              [facility] transplant Dr. Clelia Croft will be primary [phone] coordinator Sarah Wynkoop [phone] No date: KIDNEY TRANSPLANT     Comment:  03/30/19 [facility] 05/28/2013: LEFT HEART CATHETERIZATION WITH CORONARY ANGIOGRAM; N/A     Comment:  Procedure: LEFT HEART CATHETERIZATION WITH CORONARY               ANGIOGRAM;  Surgeon:  Corky Crafts, MD;  Location:               [facility] CATH LAB;  Service: Cardiovascular;  Laterality: N/A; 05/20/2023: LOWER EXTREMITY ANGIOGRAPHY; Right     Comment:  Procedure: Lower Extremity Angiography;  Surgeon: Annice Needy, MD;  Location: [facility] INVASIVE CV LAB;  Service:               Cardiovascular;  Laterality: Right; 04/22/2011: PARS PLANA VITRECTOMY     Comment:  Procedure: PARS PLANA VITRECTOMY WITH 25 GAUGE;                Surgeon: Sherrie George, MD;  Location: [facility] OR;  Service:              Ophthalmology;  Laterality: Left;  membrane peel,               endolaser, gas fluid exchange, silicone oil, repair of               complex traction retinal detachment 09/30/2011: PARS PLANA VITRECTOMY     Comment:  Procedure: PARS PLANA VITRECTOMY WITH 25 GAUGE;                Surgeon: Sherrie George, MD;  Location: [facility] OR;  Service:              Ophthalmology;  Laterality: Right;  Endolaser; Repair of               Complex Traction Retinal Detachment 02/24/2012: PARS PLANA VITRECTOMY     Comment:  Procedure: PARS PLANA VITRECTOMY WITH 25 GAUGE;                Surgeon: Sherrie George, MD;  Location: [facility] OR;  Service:              Ophthalmology;  Laterality: Left; No date: PTCA 02/24/2012: SILICON OIL REMOVAL     Comment:  Procedure: SILICON OIL REMOVAL;  Surgeon: Sherrie George, MD;  Location: [facility] OR;  Service: Ophthalmology;               Laterality: Left; No date: THROMBECTOMY / ARTERIOVENOUS GRAFT REVISION No date: TUBAL LIGATION     Comment:  1979 02/08/2019: UPPER EXTREMITY ANGIOGRAPHY; Left     Comment:  Procedure: UPPER EXTREMITY ANGIOGRAPHY;  Surgeon:               Renford Dills, MD;  Location: [facility] INVASIVE CV LAB;               Service: Cardiovascular;  Laterality: Left;  BMI    Body Mass Index: 22.44 kg/m      Reproductive/Obstetrics negative OB ROS                             Anesthesia  Physical Anesthesia Plan  ASA: 3  Anesthesia Plan: General   Post-op Pain Management:    Induction: Intravenous  PONV Risk Score and Plan: 3 and Ondansetron and Dexamethasone  Airway Management Planned: LMA  Additional Equipment:   Intra-op Plan:   Post-operative Plan: Extubation in OR  Informed Consent: I have reviewed the patients History and Physical, chart, labs and discussed the procedure including the risks, benefits and alternatives for the proposed anesthesia with  the patient or authorized representative who has indicated his/her understanding and acceptance.     Dental Advisory Given  Plan Discussed with: Anesthesiologist, CRNA and Surgeon  Anesthesia Plan Comments: (Patient's son consented for risks of anesthesia including but not limited to:  - adverse reactions to medications - damage to eyes, teeth, lips or other oral mucosa - nerve damage due to positioning  - sore throat or hoarseness - Damage to heart, brain, nerves, lungs, other parts of body or loss of life  Patient's son voiced understanding and assent.)       Anesthesia Quick Evaluation

## 2023-05-21 NOTE — Progress Notes (Signed)
Patient NPO for possible procedure. Potassium 5.8. Will order shifting measures, 2 amp D50 and 10units insulin.

## 2023-05-21 NOTE — Plan of Care (Signed)

## 2023-05-21 NOTE — Progress Notes (Signed)
Pt arrived to PACU with RBC infusing; PIV infiltrated.  Warm blanket applied to PIV site per pharmacy recommendation.  BP dropped into 60s in pacu, new PIV was started and RBC moved to new PIV.  Dr Lorette Ang was notified and at bedside.  BP responded as RBC infused.  Dr Lorette Ang returned to bedside to check on pt at 1445 and ok with bp 88/66 MAP 72.  Pt oriented to person.

## 2023-05-21 NOTE — Progress Notes (Signed)
[facility]  ROUNDING NOTE   Subjective:   Patient seen resting in bed Currently n.p.o. for scheduled procedure Denies any pain or discomfort at this time  Creatinine 1.40  Objective:  Vital signs in last 24 hours:  Temp:  [97.2 F (36.2 C)-99 F (37.2 C)] 97.6 F (36.4 C) (12/12 1541) Pulse Rate:  [80-95] 82 (12/12 1541) Resp:  [13-19] 14 (12/12 1541) BP: (68-184)/(31-97) 154/46 (12/12 1541) SpO2:  [98 %-100 %] 100 % (12/12 1541) Weight:  [65 kg] 65 kg (12/12 1019)  Weight change: -1.225 kg Filed Weights   05/19/23 1643 05/20/23 1234 05/21/23 1019  Weight: 66.2 kg 65 kg 65 kg    Intake/Output: I/O last 3 completed shifts: In: 77.5 [I.V.:77.5] Out: 1900 [Urine:1900]   Intake/Output this shift:  Total I/O In: 936 [I.V.:400; Blood:436; IV Piggyback:100] Out: 750 [Urine:450; Blood:300]  Physical Exam: General: NAD  Head: Normocephalic, atraumatic. Moist oral mucosal membranes  Eyes: Anicteric  Lungs:  Clear to auscultation, normal effort  Heart: Regular rate and rhythm  Abdomen:  Soft, nontender,   Extremities: No peripheral edema.  Gangrenous right foot  Neurologic: Nonfocal, moving all four extremities  Skin: No lesions  Access: None    Basic Metabolic Panel: Recent Labs  Lab 05/19/23 1500 05/20/23 0051 05/21/23 0512 05/21/23 1123  NA 132* 131* 136 134*  K 3.1* 3.4* 5.8* 5.1  CL 94* 96* 104 101  CO2 26 25 25   --   GLUCOSE 134* 135* 84 266*  BUN 22 24* 21 18  CREATININE 1.56* 1.41* 1.34* 1.40*  CALCIUM 8.6* 8.2* 8.5*  --   MG  --   --  1.9  --     Liver Function Tests: Recent Labs  Lab 05/19/23 1500 05/20/23 0051  AST 36 27  ALT 14 13  ALKPHOS 154* 127*  BILITOT 0.9 0.6  PROT 6.2* 5.3*  ALBUMIN 1.8* 1.6*   No results for input(s): "LIPASE", "AMYLASE" in the last 168 hours. No results for input(s): "AMMONIA" in the last 168 hours.  CBC: Recent Labs  Lab 05/19/23 1500 05/20/23 0051 05/20/23 0927 05/21/23 0512  05/21/23 1123 05/21/23 1455  WBC 5.6 4.0 4.3 4.8  --  5.3  NEUTROABS 4.0  --   --   --   --   --   HGB 9.3* 7.5* 8.3* 7.1* 7.1* 10.6*  HCT 28.5* 22.8* 26.1* 22.2* 21.0* 32.1*  MCV 98.3 99.6 101.2* 100.0  --  96.7  PLT 136* 106* 117* 103*  --  86*    Cardiac Enzymes: No results for input(s): "CKTOTAL", "CKMB", "CKMBINDEX", "TROPONINI" in the last 168 hours.  BNP: Invalid input(s): "POCBNP"  CBG: Recent Labs  Lab 05/20/23 1721 05/20/23 2117 05/21/23 0741 05/21/23 1023 05/21/23 1407  GLUCAP 133* 81 88 327* 169*    Microbiology: Results for orders placed or performed during the hospital encounter of 05/19/23  Surgical pcr screen     Status: None   Collection Time: 05/21/23  3:34 AM   Specimen: Nasal Mucosa; Nasal Swab  Result Value Ref Range Status   MRSA, PCR NEGATIVE NEGATIVE Final   Staphylococcus aureus NEGATIVE NEGATIVE Final    Comment: (NOTE) The Xpert SA Assay (FDA approved for NASAL specimens in patients 40 years of age and older), is one component of a comprehensive surveillance program. It is not intended to diagnose infection nor to guide or monitor treatment. Performed at [facility], [address], [place], [place] [address]     Coagulation  Studies: Recent Labs    05/19/23 1630  LABPROT 14.2  INR 1.1    Urinalysis: No results for input(s): "COLORURINE", "LABSPEC", "PHURINE", "GLUCOSEU", "HGBUR", "BILIRUBINUR", "KETONESUR", "PROTEINUR", "UROBILINOGEN", "NITRITE", "LEUKOCYTESUR" in the last 72 hours.  Invalid input(s): "APPERANCEUR"    Imaging: PERIPHERAL VASCULAR CATHETERIZATION Result Date: 05/20/2023 See surgical note for result.  DG Foot Complete Right Result Date: 05/19/2023 CLINICAL DATA:  Ischemia. Right foot infection. Numerous mount of decay and eschar of right foot. Foot is visibly indicating, skin is brown, black, and leathery all the way up the lateral side of the tibia/fibula. EXAM: RIGHT FOOT COMPLETE - 3+ VIEW  COMPARISON:  Right foot radiographs 02/13/2015 FINDINGS: There is diffuse decreased bone mineralization. Interval marked attenuation of the forefoot soft tissues, especially around all of the toes. Moderate to severe first through fifth interphalangeal joint space narrowing, subchondral sclerosis, and peripheral osteophytosis. Moderate great toe metatarsophalangeal joint space narrowing. Tiny plantar calcaneal heel spur. Mild dorsal talar neck degenerative osteophytosis. No definite cortical erosion is identified. No acute fracture or dislocation. Moderate atherosclerotic calcifications. IMPRESSION: 1. Interval marked attenuation of the forefoot soft tissues, especially around all of the toes. 2. 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 3. Moderate great toe metatarsophalangeal joint osteoarthritis. 4. No definite cortical erosion is identified. Electronically Signed   By: Neita Garnet M.D.   On: 05/19/2023 17:02     Medications:     (feeding supplement) PROSource Plus  30 mL Oral TID BM   vitamin C  500 mg Oral BID   atorvastatin  20 mg Oral Daily   azaTHIOprine  100 mg Oral Daily   [START ON 05/22/2023] chlorhexidine  60 mL Topical Once   Chlorhexidine Gluconate Cloth  6 each Topical Daily   cloNIDine  0.1 mg Oral Daily   feeding supplement  1 Container Oral TID BM   insulin aspart  0-5 Units Subcutaneous QHS   insulin aspart  0-9 Units Subcutaneous TID WC   isosorbide mononitrate  30 mg Oral Daily   leptospermum manuka honey  1 Application Topical Daily   multivitamin with minerals  1 tablet Oral Daily   pantoprazole  40 mg Oral Daily   pregabalin  75 mg Oral BID   tacrolimus ER  10 mg Oral QAC breakfast   zinc sulfate (50mg  elemental zinc)  220 mg Oral Daily   ipratropium-albuterol, morphine injection, oxyCODONE  Assessment/ Plan:  [name] is a 67 y.o.  female with past medical conditions including COPD, anemia, diabetes, CAD,  hyperlipidemia, status post renal transplant, hypertension, who was admitted to [facility] on 05/19/2023 for Arterial occlusion [I70.90] Diabetes mellitus with foot ulcer and gangrene (HCC) [V78.469, L97.509, E11.52] Gangrene of right foot (HCC) [I96]   Chronic kidney disease stage IIIb, status post cadaver renal transplant completed at [facility] on 03/30/2019.  Baseline creatinine 1-1.5.  Immunosuppressive medications include tacrolimus and Imuran.  Creatinine remains acceptable.  Will continue to monitor renal function.  Continue immunosuppressive medications as ordered. Appears patient may have experienced some hypotension during procedure.  Will monitor renal function  Lab Results  Component Value Date   CREATININE 1.40 (H) 05/21/2023   CREATININE 1.34 (H) 05/21/2023   CREATININE 1.41 (H) 05/20/2023    Intake/Output Summary (Last 24 hours) at 05/21/2023 1554 Last data filed at 05/21/2023 1500 Gross per 24 hour  Intake 1013.5 ml  Output 1450 ml  Net -436.5 ml    2.  Hyponatremia/ hypokalemia.  Sodium corrected however potassium now  elevated at 5.8.  Shifting measures ordered, 2 Amps D50 with 10 units of fast acting insulin.  Potassium corrected to 5.1.   3.  Hypertension with chronic kidney disease.  Home regimen includes furosemide, clonidine, and isosorbide.  Furosemide currently held.  Appears patient may have experienced some hypotension during surgical procedure.  Will monitor  4. Anemia of chronic kidney disease Lab Results  Component Value Date   HGB 10.6 (L) 05/21/2023    Hemoglobin within desired range.   LOS: 2 Collis Thede 12/12/20243:54 PM

## 2023-05-21 NOTE — Anesthesia Postprocedure Evaluation (Signed)
Anesthesia Post Note  Patient: Theresa [name]  Procedure(s) Performed: AMPUTATION ABOVE KNEE (Right: Knee)  Patient location during evaluation: PACU Anesthesia Type: General Level of consciousness: awake and alert Pain management: pain level controlled Vital Signs Assessment: post-procedure vital signs reviewed and stable Respiratory status: spontaneous breathing, nonlabored ventilation, respiratory function stable and patient connected to nasal cannula oxygen Cardiovascular status: blood pressure returned to baseline and stable Postop Assessment: no apparent nausea or vomiting Anesthetic complications: no  No notable events documented.   Last Vitals:  Vitals:   05/21/23 1430 05/21/23 1445  BP: (!) 96/36 (!) 85/66  Pulse: 85 88  Resp: 13 16  Temp: (!) 36.2 C   SpO2: 100% 100%    Last Pain:  Vitals:   05/21/23 1406  TempSrc:   PainSc: 0-No pain                 Stephanie Coup

## 2023-05-21 NOTE — Progress Notes (Signed)
Transition of Care [facility]) - Inpatient Brief Assessment   Patient Details  Name: Theresa [name] MRN: [id number] Date of Birth: 04-26-1956  [facility]) CM/SW Contact:    Truddie Hidden, RN Phone Number: 05/21/2023, 4:32 PM   Clinical Narrative: TOC continuing to follow patient's progress throughout discharge planning.   Transition of Care Asessment: Insurance and Status: Insurance coverage has been reviewed Patient has primary care physician: Yes Home environment has been reviewed: Home Prior level of function:: Independent Prior/Current Home Services: No current home services Social Drivers of Health Review: SDOH reviewed no interventions necessary Readmission risk has been reviewed: Yes Transition of care needs: no transition of care needs at this time

## 2023-05-21 NOTE — Interval H&P Note (Signed)
History and Physical Interval Note:  05/21/2023 10:42 AM  Theresa [name]  has presented today for surgery, with the diagnosis of GANGRENE RIGHT FOOT.  The various methods of treatment have been discussed with the patient and family. After consideration of risks, benefits and other options for treatment, the patient has consented to  Procedure(s): AMPUTATION ABOVE KNEE (Right) as a surgical intervention.  The patient's history has been reviewed, patient examined, no change in status, stable for surgery.  I have reviewed the patient's chart and labs.  Questions were answered to the patient's satisfaction.     [name]

## 2023-05-21 NOTE — Progress Notes (Signed)
Initial Nutrition Assessment  DOCUMENTATION CODES:   Not applicable  INTERVENTION:   -Continue regular diet -MVI with minerals daily -500 mg vitamin C BID -220 mg zinc sulfate daily x 14 days -Boost Breeze po TID, each supplement provides 250 kcal and 9 grams of protein  -30 ml Prosource Plus TID, each supplement provides 100 kcals and 15 grams protein -Draw and monitor for micronutrient deficiencies which may impair optimal wound healing: vitamin A  NUTRITION DIAGNOSIS:   Increased nutrient needs related to wound healing as evidenced by estimated needs.  GOAL:   Patient will meet greater than or equal to 90% of their needs  MONITOR:   PO intake, Supplement acceptance  REASON FOR ASSESSMENT:   Consult Assessment of nutrition requirement/status  ASSESSMENT:   Pt with medical history significant of   Anemia of chronic disease, COPD  coronary artery disease status post stent placement, Diabetes mellitus with diabetic neuropathy and retinopathy, previous history of ESRD on hemodialysis currently status post transplant, hyperlipidemia, Hypertension moderate to severe mitral regurgitation, peripheral vascular disease who is a resident in a group home brought in on account of worsening gangrene noted on the right foot.  Pt admitted with rt foot gangrene.   12/11- s/p Procedure(s): Lower Extremity Angiography (Right) 12/12- s/p rt AKA  Reviewed I/O's: -623 ml x 24 hours and -1.8 L since admission  UOP: 700 ml x 24 hours   Per CWOCN notes on 05/21/23, pt with stage 3 pressure injury to sacrum,  unstageable pressure injury to lt lateral buttock x2.   Case discussed with RN, who reports pt just returned from OR from AKA procedure.   Spoke with pt at bedside, who was drowsy at time of visit, but answered close ended questions. Pt reports poor oral intake over the past 6 months. Per pt, she did not have an appetite and did not feel like eating. Home diet consisted mainly of  liquids- commonly consumed foods included fruit juices. When asked if she incorporated other foods, pt replied "no because that's all that I wanted". Noted pt with poor dentition; pt shares that it is struggled to eat solid foods. Offered to mechanically downgrade pt's diet for ease of intake, however, pt refused, stating she does not think that this would help improve her intake. Pt is on a regular diet, which she agrees with so she has the widest variety of meal selections to choose from.   Reviewed wt hx; pt has experienced a 6.3% wt loss over the past 5 months, which is not significant for time frame. Pt reports weight loss, but unsure of UBW or how much weight she has lost.   Discussed importance of good meal and supplement intake to promote healing. Pt amenable to supplements, but refusing milk based supplements such as Ensure as pt shares that this has caused diarrhea in the past. She is amenable to supplements that are not milk based.   Per chart, pt was receiving hospice services at home.   Medications reviewed and include medihoney, protonix, lyrica, and 0.9% sodium chloride infusion @ 75 ml/hr.   Lab Results  Component Value Date   HGBA1C 4.9 05/20/2023   PTA DM medications are none.   Labs reviewed: Na: 134, Phos: 6.3, K and Mg WDL, CBGS: 81-327 (inpatient orders for glycemic control are 0-5 units insulin aspart daily at bedtime, 0-9 units insulin aspart TID with meals).    NUTRITION - FOCUSED PHYSICAL EXAM:  Flowsheet Row Most Recent Value  Orbital Region No  depletion  Upper Arm Region Moderate depletion  Thoracic and Lumbar Region No depletion  Buccal Region No depletion  Temple Region No depletion  Clavicle Bone Region No depletion  Clavicle and Acromion Bone Region No depletion  Scapular Bone Region No depletion  Dorsal Hand Moderate depletion  Patellar Region Moderate depletion  Anterior Thigh Region Moderate depletion  Posterior Calf Region Moderate depletion  Edema  (RD Assessment) None  Hair Reviewed  Eyes Reviewed  Mouth Reviewed  Skin Reviewed  Nails Reviewed       Diet Order:   Diet Order             Diet regular Room service appropriate? Yes; Fluid consistency: Thin  Diet effective now                   EDUCATION NEEDS:   Education needs have been addressed  Skin:  Skin Assessment: Skin Integrity Issues: Skin Integrity Issues:: Incisions, Stage III, Unstageable Stage III: sacrum Unstageable: lt lateral buttock x 2 Incisions: s/p rt AKA on 05/21/23  Last BM:  Unknown  Height:   Ht Readings from Last 1 Encounters:  05/21/23 5\' 7"  (1.702 m)    Weight:   Wt Readings from Last 1 Encounters:  05/21/23 65 kg    Ideal Body Weight:  56.5 kg (adjusted for rt AKA)  BMI:  Body mass index is 22.44 kg/m.  Estimated Nutritional Needs:   Kcal:  1700-1900  Protein:  90-105 grams  Fluid:  > 1.7 L    Levada Schilling, RD, LDN, CDCES Registered Dietitian III Certified Diabetes Care and Education Specialist If unable to reach this RD, please use "RD Inpatient" group chat on secure chat between hours of 8am-4 pm daily

## 2023-05-21 NOTE — Consult Note (Signed)
WOC Nurse Consult Note: patient from home with hospice (?rescinded)  states she has had no wound care to sacral wound  Reason for Consult:sacral ulcer  Wound type: 1.  Stage 3 Pressure Injury Sacrum  2.  Unstageable PI to Left lateral buttock  Pressure Injury POA: Yes Measurement: 1.  Sacrum Stage 3 2.8 cm x 4 cm x 0.5 cm (has a piece of stringy slough with depth underneath) 60% pink moist 40% tan slough  2.  L lateral buttock Unstageable PI 1 cm x 1 cm 80% yellow slough 20% pink moist  Wound bed:as above  Drainage (amount, consistency, odor) minimal tan exudate  Periwound: scattered areas of partial thickness skin loss  Dressing procedure/placement/frequency: Cleanse L lateral buttock and sacral wounds with NS, apply Medihoney to wound beds daily, cover L buttock wound with dry gauze, apply saline moist gauze to sacral wound bed using a Q tip applicator to fill in any depth.  Cover with dry gauze and silicone foam.  May lift foam daily to reapply Medihoney.  Change foam q3 days and prn soiling.   POC discussed with patient and bedside nurse. WOC team will not follow. Re-consult if further needs arise.   Thank you,    Priscella Mann MSN, RN-BC, [employer] [phone]

## 2023-05-21 NOTE — Op Note (Signed)
  [facility]   OPERATIVE NOTE   PROCEDURE:  Right above-the-knee amputation  PRE-OPERATIVE DIAGNOSIS: Right foot gangrene  POST-OPERATIVE DIAGNOSIS: same as above  SURGEON:  [name], MD  ASSISTANT(S): none  ANESTHESIA: general  ESTIMATED BLOOD LOSS: 150 cc  FINDING(S): none  SPECIMEN(S):  Right above-the-knee amputation  INDICATIONS:   Theresa [name] is a 67 y.o. female who presents with right foot and lower leg gangrene.  The patient is scheduled for a right above-the-knee amputation.  I discussed in depth with the patient the risks, benefits, and alternatives to this procedure.  The patient is aware that the risk of this operation included but are not limited to:  bleeding, infection, myocardial infarction, stroke, death, failure to heal amputation wound, and possible need for more proximal amputation.  The patient is aware of the risks and agrees proceed forward with the procedure.  DESCRIPTION: After full informed written consent was obtained from the patient, the patient was taken to the operating room, and placed supine upon the operating table.  Prior to induction, the patient received IV antibiotics.  The patient was then prepped and draped in the standard fashion for a right above-the-knee amputation.  After obtaining adequate anesthesia, the patient was prepped and draped in the standard fashion for a above-the-knee amputation.  I marked out the anterior and posterior flaps for a fish-mouth type of amputation.  I made the incisions for these flaps, and then dissected through the subcutaneous tissue, fascia, and muscles circumferentially.  I elevated  the periosteal tissue 4-5 cm more proximal than the anterior skin flap.  I then transected the femur with a power saw at this level.  Then I smoothed out the rough edges of the bone.  At this point, the specimen was passed off the field as the above-the-knee amputation.  At this point, I clamped all  visibly bleeding arteries and veins using a combination of suture ligation with silk suture and electrocautery.   Bleeding continued to be controlled with electrocautery and suture ligature.  The stump was washed off with sterile normal saline and no further active bleeding was noted.  I reapproximated the anterior and posterior fascia  with interrupted stitches of 0 Vicryl.  This was completed along the entire length of anterior and posterior fascia until there were no more loose space in the fascial line. The subcutaneous tissue was then approximated with 2-0 vicryl sutures. The skin was then  reapproximated with staples.  The stump was washed off and dried.  The incision was dressed with Xeroform and ABD pads, and  then fluffs were applied.  Kerlix was wrapped around the leg and then gently an ACE wrap was applied.  A large Ioban was then placed over the ACE wrap to secure the dressing. The patient was then awakened and take to the recovery room in stable condition.   COMPLICATIONS: none  CONDITION: stable  [name]  05/21/2023, 1:57 PM   This note was created with Dragon Medical transcription system. Any errors in dictation are purely unintentional.

## 2023-05-22 ENCOUNTER — Encounter: Payer: Self-pay | Admitting: Vascular Surgery

## 2023-05-22 DIAGNOSIS — L97509 Non-pressure chronic ulcer of other part of unspecified foot with unspecified severity: Secondary | ICD-10-CM | POA: Diagnosis not present

## 2023-05-22 DIAGNOSIS — E11621 Type 2 diabetes mellitus with foot ulcer: Secondary | ICD-10-CM | POA: Diagnosis not present

## 2023-05-22 DIAGNOSIS — Z940 Kidney transplant status: Secondary | ICD-10-CM | POA: Diagnosis not present

## 2023-05-22 DIAGNOSIS — I129 Hypertensive chronic kidney disease with stage 1 through stage 4 chronic kidney disease, or unspecified chronic kidney disease: Secondary | ICD-10-CM | POA: Diagnosis not present

## 2023-05-22 DIAGNOSIS — E871 Hypo-osmolality and hyponatremia: Secondary | ICD-10-CM | POA: Diagnosis not present

## 2023-05-22 DIAGNOSIS — E1152 Type 2 diabetes mellitus with diabetic peripheral angiopathy with gangrene: Secondary | ICD-10-CM | POA: Diagnosis not present

## 2023-05-22 DIAGNOSIS — N1832 Chronic kidney disease, stage 3b: Secondary | ICD-10-CM | POA: Diagnosis not present

## 2023-05-22 LAB — BPAM RBC
Blood Product Expiration Date: 202501112359
Blood Product Expiration Date: 202501122359
ISSUE DATE / TIME: 202412121210
ISSUE DATE / TIME: 202412121210
Unit Type and Rh: 5100
Unit Type and Rh: 5100

## 2023-05-22 LAB — TYPE AND SCREEN
ABO/RH(D): O POS
Antibody Screen: NEGATIVE
Unit division: 0
Unit division: 0

## 2023-05-22 LAB — CBC
HCT: 30.2 % — ABNORMAL LOW (ref 36.0–46.0)
Hemoglobin: 9.9 g/dL — ABNORMAL LOW (ref 12.0–15.0)
MCH: 31.6 pg (ref 26.0–34.0)
MCHC: 32.8 g/dL (ref 30.0–36.0)
MCV: 96.5 fL (ref 80.0–100.0)
Platelets: 82 10*3/uL — ABNORMAL LOW (ref 150–400)
RBC: 3.13 MIL/uL — ABNORMAL LOW (ref 3.87–5.11)
RDW: 17.6 % — ABNORMAL HIGH (ref 11.5–15.5)
WBC: 7.8 10*3/uL (ref 4.0–10.5)
nRBC: 0 % (ref 0.0–0.2)

## 2023-05-22 LAB — GLUCOSE, CAPILLARY
Glucose-Capillary: 120 mg/dL — ABNORMAL HIGH (ref 70–99)
Glucose-Capillary: 186 mg/dL — ABNORMAL HIGH (ref 70–99)
Glucose-Capillary: 310 mg/dL — ABNORMAL HIGH (ref 70–99)
Glucose-Capillary: 175 mg/dL — ABNORMAL HIGH (ref 70–99)
Glucose-Capillary: 44 mg/dL — CL (ref 70–99)

## 2023-05-22 LAB — BASIC METABOLIC PANEL
Anion gap: 11 (ref 5–15)
BUN: 20 mg/dL (ref 8–23)
CO2: 21 mmol/L — ABNORMAL LOW (ref 22–32)
Calcium: 8.5 mg/dL — ABNORMAL LOW (ref 8.9–10.3)
Chloride: 104 mmol/L (ref 98–111)
Creatinine, Ser: 1.47 mg/dL — ABNORMAL HIGH (ref 0.44–1.00)
GFR, Estimated: 39 mL/min — ABNORMAL LOW (ref >60)
Glucose, Bld: 125 mg/dL — ABNORMAL HIGH (ref 70–99)
Potassium: 5.1 mmol/L (ref 3.5–5.1)
Sodium: 136 mmol/L (ref 135–145)

## 2023-05-22 MED ORDER — CLOPIDOGREL BISULFATE 75 MG PO TABS
75.0000 mg | ORAL_TABLET | Freq: Every day | ORAL | Status: DC
  Administered 2023-05-22 – 2023-06-07 (×15): 75 mg via ORAL
  Filled 2023-05-22 (×18): qty 1

## 2023-05-22 MED ORDER — OXYCODONE HCL 5 MG PO TABS
5.0000 mg | ORAL_TABLET | ORAL | Status: DC | PRN
  Administered 2023-05-22 – 2023-05-28 (×9): 10 mg via ORAL
  Administered 2023-05-28 – 2023-05-29 (×2): 5 mg via ORAL
  Administered 2023-05-29 – 2023-05-30 (×2): 10 mg via ORAL
  Filled 2023-05-22: qty 2
  Filled 2023-05-22: qty 1
  Filled 2023-05-22 (×7): qty 2
  Filled 2023-05-22: qty 1
  Filled 2023-05-22 (×5): qty 2

## 2023-05-22 MED ORDER — DEXTROSE 50 % IV SOLN
1.0000 | Freq: Once | INTRAVENOUS | Status: AC
  Administered 2023-05-22: 50 mL via INTRAVENOUS
  Filled 2023-05-22: qty 50

## 2023-05-22 MED ORDER — MORPHINE SULFATE (PF) 2 MG/ML IV SOLN
2.0000 mg | INTRAVENOUS | Status: DC | PRN
  Administered 2023-05-22 – 2023-06-06 (×15): 2 mg via INTRAVENOUS
  Filled 2023-05-22 (×16): qty 1

## 2023-05-22 MED ORDER — ENOXAPARIN SODIUM 30 MG/0.3ML IJ SOSY
30.0000 mg | PREFILLED_SYRINGE | INTRAMUSCULAR | Status: DC
  Administered 2023-05-22 – 2023-05-23 (×2): 30 mg via SUBCUTANEOUS
  Filled 2023-05-22 (×2): qty 0.3

## 2023-05-22 NOTE — Progress Notes (Signed)
Progress Note    05/22/2023 7:32 AM 1 Day Post-Op  Subjective:  TAMIKI HAMMAC is a 67 y.o. female who presents with right foot and lower leg gangrene.  The patient is POD #1 from a right above-the-knee amputation. Patient resting comfortably in bed. Patient endorses incision pain but endorses her pain is better today. Dressing remains intact without complications. No complaints overnight and vitals all remain stable.    Vitals:   05/22/23 0003 05/22/23 0346  BP: (!) 162/59 (!) 173/50  Pulse: 83 81  Resp: 17   Temp: 98.3 F (36.8 C) 98.6 F (37 C)  SpO2: 99% 97%   Physical Exam: Cardiac:  normal S1/S2, RRR, no JVD, murmurs, rubs, gallops, no pedal edema  Lungs:  CTAB, no wheezes, rales or rhonchi, normal respiratory effort.  Incisions:  Right Above the knee amputation with dressing. Clean dry and intact.  Extremities:  Left lower extremity warm to touch with doppler pulses. Right Lower extremity AKA. Dressing clean dry and intact. No drainage to note.  Abdomen:  Positive bowel sounds throughout, soft, non tender and non distended. Neurologic: AAOX3 answers all questions and follows commands appropriately.   CBC    Component Value Date/Time   WBC 7.8 05/22/2023 0703   RBC 3.13 (L) 05/22/2023 0703   HGB 9.9 (L) 05/22/2023 0703   HGB 12.2 05/12/2014 0741   HCT 30.2 (L) 05/22/2023 0703   HCT 38.9 05/12/2014 0741   PLT 82 (L) 05/22/2023 0703   PLT 133 (L) 05/12/2014 0741   MCV 96.5 05/22/2023 0703   MCV 102 (H) 05/12/2014 0741   MCH 31.6 05/22/2023 0703   MCHC 32.8 05/22/2023 0703   RDW 17.6 (H) 05/22/2023 0703   RDW 16.0 (H) 05/12/2014 0741   LYMPHSABS 1.1 05/19/2023 1500   LYMPHSABS 2.4 02/15/2014 0837   MONOABS 0.4 05/19/2023 1500   MONOABS 0.5 02/15/2014 0837   EOSABS 0.1 05/19/2023 1500   EOSABS 0.2 02/15/2014 0837   BASOSABS 0.0 05/19/2023 1500   BASOSABS 0.0 02/15/2014 0837    BMET    Component Value Date/Time   NA 134 (L) 05/21/2023 1123   NA 143  07/27/2015 1737   NA 137 05/14/2014 0741   K 5.1 05/21/2023 1123   K 3.3 (L) 05/14/2014 1500   CL 101 05/21/2023 1123   CL 98 05/14/2014 0741   CO2 25 05/21/2023 0512   CO2 35 (H) 05/14/2014 0741   GLUCOSE 266 (H) 05/21/2023 1123   GLUCOSE 96 05/14/2014 0741   BUN 18 05/21/2023 1123   BUN 25 (H) 05/14/2014 0741   CREATININE 1.40 (H) 05/21/2023 1123   CREATININE 5.83 (H) 05/14/2014 0741   CALCIUM 8.5 (L) 05/21/2023 0512   CALCIUM 8.9 05/14/2014 0741   GFRNONAA 44 (L) 05/21/2023 0512   GFRNONAA 8 (L) 05/14/2014 0741   GFRNONAA 4 (L) 02/15/2014 0837   GFRAA 7 (L) 01/12/2018 1638   GFRAA 10 (L) 05/14/2014 0741   GFRAA 5 (L) 02/15/2014 0837    INR    Component Value Date/Time   INR 1.1 05/19/2023 1630   INR 1.0 10/10/2013 1135     Intake/Output Summary (Last 24 hours) at 05/22/2023 0732 Last data filed at 05/21/2023 1920 Gross per 24 hour  Intake 1149.83 ml  Output 850 ml  Net 299.83 ml     Assessment/Plan:  67 y.o. female is s/p Right AKA 1 Day Post-Op   Plan: First Dressing change this weekend by Vascular Surgery. Start Plavix 75 mg daily.  Start DVT Prophylaxis Lovenox 30 mg today.   Pain medication PRN Work with PT/OT OOB to chair for most of the day time. Dispo plan is rehab early next week.   DVT prophylaxis:  Start Lovenox 30 MG today    Marcie [facility] 05/22/2023 7:32 AM

## 2023-05-22 NOTE — Significant Event (Signed)
Hypoglycemic Event  CBG: 44  Treatment: D50 50 mL (25 gm)  Symptoms: None  Follow-up CBG: Time:2126 CBG Result:175  Possible Reasons for Event: Inadequate meal intake  Comments/MD notified:D50 ordered   MD notified    Trude Mcburney

## 2023-05-22 NOTE — Progress Notes (Signed)
Progress Note   Patient: Theresa [name] [id number] DOB: [birth year] DOA: 05/19/2023     3 DOS: the patient was seen and examined on 05/22/2023   Brief hospital course: HPI on admission: "Theresa [name] is a 67 y.o. female with medical history significant of  Anemia of chronic disease, COPD coronary artery disease status post stent placement, Diabetes mellitus with diabetic neuropathy and retinopathy, previous history of ESRD on hemodialysis currently status post transplant, hyperlipidemia, Hypertension moderate to severe mitral regurgitation, peripheral vascular disease who is a resident in a group home brought in on account of worsening gangrene noted on the right foot.  ..." See H&P for full HPI on admission & ED course.  Patient was admitted to the hospital, started on IV heparin and Vascular Surgery was consulted, plan for angiogram today (12/11).  Further hospital course and management as outlined below.   Assessment and Plan:  Right foot gangrene No signs of infection identified, no antibiotics indicated at this time --Initially on heparin drip, now stopped --Vascular surgery following, Dr. Wyn Quaker --12/11 -- underwent angiogram with angioplasty to right SFA and popliteal arteries --12/12 -- underwent Right AKA --Initial dressing change by vascular this weekend --Started on Plavix 75 mg daily --Lovenox for DVT ppx started --OOB to chair --PT/OT evaluations -- return to SNF anticipated --Follow up Vascular's recommendations   Hypokalemia - resolved with replacement --Monitor BMP, replace K PRN  Hyperkalemia - K 5.8 on 12/12 treated and resolved --Monitor BMP   Hypovolemic hyponatremia - mild, Na 132 on admission.  Pt appeared clinically dehydrated. Na 134 today --Encourage oral hydration --Monitor BMP   Anemia of chronic disease - Hbg stable --Monitor CBC   COPD - stable, not exacerbated --PRN bronchodilators   Coronary artery disease status post stent  placement --Continue statin therapy   Diabetes mellitus with diabetic neuropathy and retinopathy --Monitor CBG's --Sliding scale Novolog --Continue Lyrica   History of ESRD  Patient used to be on on hemodialysis currently status post transplant No longer on hemodialysis Renal function currently at baseline Resumed home dose of tacrolimus I will consult nephrology given history of transplants with rejection medication   Severe hypoalbuminemia Dietitian consulted   Hyperlipidemia Hypertension moderate to severe mitral regurgitation Peripheral vascular disease      Subjective: Pt awake resting in bed, seen before surgery today.  She reports AKA surgical sight pain this AM, was given medication 45 min earlier and states it has not taken effect yet.  She denies other acute complaints.     Physical Exam: Vitals:   05/21/23 1541 05/21/23 1927 05/22/23 0003 05/22/23 0346  BP: (!) 154/46 (!) 150/52 (!) 162/59 (!) 173/50  Pulse: 82 77 83 81  Resp: 14  17   Temp: 97.6 F (36.4 C) 97.7 F (36.5 C) 98.3 F (36.8 C) 98.6 F (37 C)  TempSrc: Oral  Oral Oral  SpO2: 100% 100% 99% 97%  Weight:      Height:       General exam: awake, alert, no acute distress, chronically ill appearing HEENT: moist mucus membranes, hearing grossly normal  Respiratory system: CTAB, no wheezes, rales or rhonchi, normal respiratory effort. Cardiovascular system: normal S1/S2, RRR, no pedal edema.   Gastrointestinal system: soft, NT, ND Central nervous system: A&O x. no gross focal neurologic deficits, normal speech Extremities: right AKA with occlusive dressing in place no edema, normal tone Psychiatry: normal mood, congruent affect  Images taken on 05/20/23:     Data Reviewed:  Notable labs --  glucose 125, Cr 1.40 >> 1.47 stable Ca 8.5 Hbg 7.1 (given RBC transfusion 12/12) >> 10.6 >> 9.9 Platelets 103 >> 86 >> 82k   Family Communication: None present on rounds. Will attempt to call as  time allows.   Disposition: Status is: Inpatient Remains inpatient appropriate because: vascular surgery today, on IV therapies, ongoing evaluation   Planned Discharge Destination: return to prior home environment    Time spent: 38 minutes  Author: Pennie Banter, DO 05/22/2023 12:05 PM  For on call review [email or web address].

## 2023-05-22 NOTE — Progress Notes (Signed)
[facility]  Daily Progress Note   Subjective  -   No major events overnight.  Pain control seems adequate.  Having appropriate incisional pain.  Objective Vitals:   05/21/23 1927 05/22/23 0003 05/22/23 0346 05/22/23 1209  BP: (!) 150/52 (!) 162/59 (!) 173/50 (!) 149/39  Pulse: 77 83 81 91  Resp:  17  18  Temp: 97.7 F (36.5 C) 98.3 F (36.8 C) 98.6 F (37 C)   TempSrc:  Oral Oral   SpO2: 100% 99% 97% 98%  Weight:      Height:        Intake/Output Summary (Last 24 hours) at 05/22/2023 1418 Last data filed at 05/22/2023 0800 Gross per 24 hour  Intake 213.83 ml  Output 550 ml  Net -336.17 ml    PULM  CTAB CV  RRR VASC  dressing is clean, dry, and intact.  Laboratory CBC    Component Value Date/Time   WBC 7.8 05/22/2023 0703   HGB 9.9 (L) 05/22/2023 0703   HGB 12.2 05/12/2014 0741   HCT 30.2 (L) 05/22/2023 0703   HCT 38.9 05/12/2014 0741   PLT 82 (L) 05/22/2023 0703   PLT 133 (L) 05/12/2014 0741    BMET    Component Value Date/Time   NA 136 05/22/2023 0703   NA 143 07/27/2015 1737   NA 137 05/14/2014 0741   K 5.1 05/22/2023 0703   K 3.3 (L) 05/14/2014 1500   CL 104 05/22/2023 0703   CL 98 05/14/2014 0741   CO2 21 (L) 05/22/2023 0703   CO2 35 (H) 05/14/2014 0741   GLUCOSE 125 (H) 05/22/2023 0703   GLUCOSE 96 05/14/2014 0741   BUN 20 05/22/2023 0703   BUN 25 (H) 05/14/2014 0741   CREATININE 1.47 (H) 05/22/2023 0703   CREATININE 5.83 (H) 05/14/2014 0741   CALCIUM 8.5 (L) 05/22/2023 0703   CALCIUM 8.9 05/14/2014 0741   GFRNONAA 39 (L) 05/22/2023 0703   GFRNONAA 8 (L) 05/14/2014 0741   GFRNONAA 4 (L) 02/15/2014 0837   GFRAA 7 (L) 01/12/2018 1638   GFRAA 10 (L) 05/14/2014 0741   GFRAA 5 (L) 02/15/2014 0837    Assessment/Planning: POD #1 s/p right AKA  Pain control is adequate Dressing can come off on Sunday Physical therapy/Occupational Therapy of benefit   [name]  05/22/2023, 2:18 PM

## 2023-05-22 NOTE — Progress Notes (Signed)
[facility]  ROUNDING NOTE   Subjective:   Patient seen laying in bed Withdrawn Denies pain States she is not hungry  Creatinine 1.47  Objective:  Vital signs in last 24 hours:  Temp:  [97.7 F (36.5 C)-98.6 F (37 C)] 98.6 F (37 C) (12/13 0346) Pulse Rate:  [77-91] 91 (12/13 1209) Resp:  [17-18] 18 (12/13 1209) BP: (149-173)/(39-59) 149/39 (12/13 1209) SpO2:  [97 %-100 %] 98 % (12/13 1209)  Weight change: 0 kg Filed Weights   05/19/23 1643 05/20/23 1234 05/21/23 1019  Weight: 66.2 kg 65 kg 65 kg    Intake/Output: I/O last 3 completed shifts: In: 1227.3 [P.O.:100; I.V.:491.3; Blood:436; IV Piggyback:200] Out: 1150 [Urine:850; Blood:300]   Intake/Output this shift:  Total I/O In: -  Out: 250 [Urine:250]  Physical Exam: General: NAD  Head: Normocephalic, atraumatic. Moist oral mucosal membranes  Eyes: Anicteric  Lungs:  Clear to auscultation, normal effort  Heart: Regular rate and rhythm  Abdomen:  Soft, nontender,   Extremities: No peripheral edema.  Rt AKA on 05/21/23  Neurologic: Alert and oriented, moving all four extremities  Skin: No lesions  Access: None    Basic Metabolic Panel: Recent Labs  Lab 05/19/23 1500 05/20/23 0051 05/21/23 0512 05/21/23 1123 05/22/23 0703  NA 132* 131* 136 134* 136  K 3.1* 3.4* 5.8* 5.1 5.1  CL 94* 96* 104 101 104  CO2 26 25 25   --  21*  GLUCOSE 134* 135* 84 266* 125*  BUN 22 24* 21 18 20   CREATININE 1.56* 1.41* 1.34* 1.40* 1.47*  CALCIUM 8.6* 8.2* 8.5*  --  8.5*  MG  --   --  1.9  --   --     Liver Function Tests: Recent Labs  Lab 05/19/23 1500 05/20/23 0051  AST 36 27  ALT 14 13  ALKPHOS 154* 127*  BILITOT 0.9 0.6  PROT 6.2* 5.3*  ALBUMIN 1.8* 1.6*   No results for input(s): "LIPASE", "AMYLASE" in the last 168 hours. No results for input(s): "AMMONIA" in the last 168 hours.  CBC: Recent Labs  Lab 05/19/23 1500 05/20/23 0051 05/20/23 0927 05/21/23 0512 05/21/23 1123  05/21/23 1455 05/22/23 0703  WBC 5.6 4.0 4.3 4.8  --  5.3 7.8  NEUTROABS 4.0  --   --   --   --   --   --   HGB 9.3* 7.5* 8.3* 7.1* 7.1* 10.6* 9.9*  HCT 28.5* 22.8* 26.1* 22.2* 21.0* 32.1* 30.2*  MCV 98.3 99.6 101.2* 100.0  --  96.7 96.5  PLT 136* 106* 117* 103*  --  86* 82*    Cardiac Enzymes: No results for input(s): "CKTOTAL", "CKMB", "CKMBINDEX", "TROPONINI" in the last 168 hours.  BNP: Invalid input(s): "POCBNP"  CBG: Recent Labs  Lab 05/21/23 1601 05/21/23 2027 05/21/23 2107 05/22/23 0736 05/22/23 1208  GLUCAP 150* 142* 142* 120* 186*    Microbiology: Results for orders placed or performed during the hospital encounter of 05/19/23  Surgical pcr screen     Status: None   Collection Time: 05/21/23  3:34 AM   Specimen: Nasal Mucosa; Nasal Swab  Result Value Ref Range Status   MRSA, PCR NEGATIVE NEGATIVE Final   Staphylococcus aureus NEGATIVE NEGATIVE Final    Comment: (NOTE) The Xpert SA Assay (FDA approved for NASAL specimens in patients 55 years of age and older), is one component of a comprehensive surveillance program. It is not intended to diagnose infection nor to guide or monitor treatment. Performed at [facility], [address] [place], [place] [address]     Coagulation Studies: Recent Labs    05/19/23 1630  LABPROT 14.2  INR 1.1    Urinalysis: No results for input(s): "COLORURINE", "LABSPEC", "PHURINE", "GLUCOSEU", "HGBUR", "BILIRUBINUR", "KETONESUR", "PROTEINUR", "UROBILINOGEN", "NITRITE", "LEUKOCYTESUR" in the last 72 hours.  Invalid input(s): "APPERANCEUR"    Imaging: No results found.    Medications:     (feeding supplement) PROSource Plus  30 mL Oral TID BM   vitamin C  500 mg Oral BID   atorvastatin  20 mg Oral Daily   azaTHIOprine  100 mg Oral Daily   Chlorhexidine Gluconate Cloth  6 each Topical Daily   cloNIDine  0.1 mg Oral Daily   clopidogrel  75 mg Oral Daily   enoxaparin (LOVENOX) injection  30 mg  Subcutaneous Q24H   feeding supplement  1 Container Oral TID BM   insulin aspart  0-5 Units Subcutaneous QHS   insulin aspart  0-9 Units Subcutaneous TID WC   isosorbide mononitrate  30 mg Oral Daily   leptospermum manuka honey  1 Application Topical Daily   multivitamin with minerals  1 tablet Oral Daily   pantoprazole  40 mg Oral Daily   pregabalin  75 mg Oral BID   tacrolimus ER  10 mg Oral QAC breakfast   zinc sulfate (50mg  elemental zinc)  220 mg Oral Daily   ipratropium-albuterol, morphine injection, oxyCODONE  Assessment/ Plan:  Ms. HARINI BERTE is a 67 y.o.  female with past medical conditions including COPD, anemia, diabetes, CAD, hyperlipidemia, status post renal transplant, hypertension, who was admitted to [facility] on 05/19/2023 for Arterial occlusion [I70.90] Diabetes mellitus with foot ulcer and gangrene (HCC) [U04.540, L97.509, E11.52] Gangrene of right foot (HCC) [I96]   Chronic kidney disease stage IIIb, status post cadaver renal transplant completed at [facility] on 03/30/2019.  Baseline creatinine 1-1.5.  Immunosuppressive medications include tacrolimus and Imuran.  Creatinine remains acceptable. Will continue to monitor  Lab Results  Component Value Date   CREATININE 1.47 (H) 05/22/2023   CREATININE 1.40 (H) 05/21/2023   CREATININE 1.34 (H) 05/21/2023    Intake/Output Summary (Last 24 hours) at 05/22/2023 1544 Last data filed at 05/22/2023 0800 Gross per 24 hour  Intake 213.83 ml  Output 350 ml  Net -136.17 ml    2.  Hyponatremia/ hypokalemia.  Sodium corrected Potassium remains 5.1.    3.  Hypertension with chronic kidney disease.  Home regimen includes furosemide, clonidine, and isosorbide.  Furosemide currently held.  Blood pressure stable  4. Anemia of chronic kidney disease Lab Results  Component Value Date   HGB 9.9 (L) 05/22/2023    Hemoglobin 9.9   LOS: 3 Joanthony Hamza 12/13/20243:44 PM

## 2023-05-22 NOTE — Care Management Important Message (Signed)
Important Message  Patient Details  Name: Theresa [name] MRN: [id number] Date of Birth: 1956/05/15   Important Message Given:  N/A - LOS <3 / Initial given by admissions     Olegario Messier A [name] 05/22/2023, 10:40 AM

## 2023-05-22 NOTE — Progress Notes (Signed)
       CROSS COVER NOTE  NAME: Theresa CLESTER MRN: [id number] DOB : Jun 02, 1956    Concern as stated by nurse / staff   Informed of cbg 44      Pertinent findings on chart review: POD 1 a/p right AKA for right foot gangrene. Hx of COPD, CAD, ESRD, HLD, hypoalbuminemia, anemia of chronic disease, and DM; DM being managed with sensitive dosing sliding scale insulin  Assessment and  Interventions   Assessment:    05/22/2023    7:30 PM 05/22/2023    4:19 PM 05/22/2023   12:09 PM  Vitals with BMI  Systolic 156 160 440  Diastolic 56 46 39  Pulse 92 96 91   Likely poor oral intake with relation to surgery. Cannot rule out poor gluconeogenesis response from liver Plan: Amp D50 Cbg  monitoring every h x 2 then q2h x2       Donnie Mesa NP [facility] Cross Cover 7pm-7am - check amion for availability Pager [phone]

## 2023-05-22 NOTE — Plan of Care (Signed)
  Problem: Education: Goal: Ability to describe self-care measures that may prevent or decrease complications (Diabetes Survival Skills Education) will improve Outcome: Progressing Goal: Individualized Educational Video(s) Outcome: Progressing   Problem: Fluid Volume: Goal: Ability to maintain a balanced intake and output will improve Outcome: Progressing   Problem: Coping: Goal: Ability to adjust to condition or change in health will improve Outcome: Progressing   Problem: Health Behavior/Discharge Planning: Goal: Ability to identify and utilize available resources and services will improve Outcome: Progressing Goal: Ability to manage health-related needs will improve Outcome: Progressing

## 2023-05-22 NOTE — Evaluation (Signed)
Occupational Therapy Evaluation Patient Details Name: Theresa [name] MRN: [id number] DOB: 09/28/1955 Today's Date: 05/22/2023   History of Present Illness Pt is s/p R AKA 2/2 R foot and lower leg gangrene. PMH of CAD s/p stent, HTN, COPD, MI, CHF, dementia, DMII, previous ESRD now s/p transplant, HLD, PVD.   Clinical Impression   Pt was seen for OT evaluation this date. Prior to hospital admission, pt was living in a group home per chart. Pt is a poor historian and does not provide additional PLOF/home setup. Pt with flat affect, increased processing time required, oriented to self, and difficulty with following simple commands. Pt denies pain at start of session but with bed mobility and minor pressure to R residual limb during transition to sitting, pt endorses pain. Pt required heavy assist for remaining upright EOB with heavy R lateral lean noted throughout. Anticipate pt requires MAX A for LB ADL tasks and MAX A for seated UB ADL tasks. Pt presents to acute OT demonstrating impaired ADL performance and functional mobility 2/2 decreased cognition, strength, balance, and activity tolerance (See OT problem list for additional functional deficits). Pt tolerated sitting EOB <67min with request to return to bed declining further therapy. Pt may benefit from skilled OT services to address noted impairments and functional limitations (see below for any additional details) in order to maximize safety and independence while minimizing falls risk and caregiver burden.     If plan is discharge home, recommend the following: Two people to help with walking and/or transfers;Two people to help with bathing/dressing/bathroom;Direct supervision/assist for medications management;Supervision due to cognitive status;Direct supervision/assist for financial management;Assist for transportation;Help with stairs or ramp for entrance;Assistance with feeding;Assistance with cooking/housework    Functional Status  Assessment  Patient has had a recent decline in their functional status and demonstrates the ability to make significant improvements in function in a reasonable and predictable amount of time.  Equipment Recommendations  BSC/3in1;Wheelchair (measurements OT);Wheelchair cushion (measurements OT)    Recommendations for Other Services       Precautions / Restrictions Precautions Precautions: Fall Restrictions Weight Bearing Restrictions Per Provider Order: Yes RLE Weight Bearing Per Provider Order: Non weight bearing Other Position/Activity Restrictions: R AKA      Mobility Bed Mobility Overal bed mobility: Needs Assistance Bed Mobility: Supine to Sit, Sit to Supine     Supine to sit: Max assist, +2 for physical assistance, HOB elevated, Used rails Sit to supine: Max assist, +2 for physical assistance        Transfers                   General transfer comment: unsafe to attempt      Balance Overall balance assessment: Needs assistance   Sitting balance-Leahy Scale: Poor Sitting balance - Comments: poor-; pt required varying MOD A to TOTAL A to maintain upright positioning EOB with heavy R lateral lean Postural control: Right lateral lean                                 ADL either performed or assessed with clinical judgement   ADL                                         General ADL Comments: Pt currently requires MAX A for LB ADL tasks, refused grooming task, MAX  A for UB ADL 2/2 poor- balance in sitting. Pt denied appetite for food or drink. Limited functional assessment 2/2 cognition.     Vision         Perception         Praxis         Pertinent Vitals/Pain Pain Assessment Pain Assessment: Faces Faces Pain Scale: Hurts little more Pain Location: Pt denies pain at start of session, with bed mobility and slight pressure to residual limb, pt moans out in pain, grimacing Pain Descriptors / Indicators: Grimacing,  Moaning, Guarding Pain Intervention(s): Monitored during session, Limited activity within patient's tolerance, Premedicated before session, Repositioned     Extremity/Trunk Assessment Upper Extremity Assessment Upper Extremity Assessment: Generalized weakness   Lower Extremity Assessment Lower Extremity Assessment: Generalized weakness;RLE deficits/detail RLE Deficits / Details: AKA       Communication     Cognition Arousal: Alert Behavior During Therapy: Flat affect Overall Cognitive Status: History of cognitive impairments - at baseline                                 General Comments: Pt alert, oriented to self, slow processing, difficulty following commands     General Comments       Exercises     Shoulder Instructions      Home Living Family/patient expects to be discharged to:: Group home                                 Additional Comments: Per chart, pt from group home, pt poor historian      Prior Functioning/Environment Prior Level of Function : Patient poor historian/Family not available                        OT Problem List: Decreased strength;Pain;Decreased range of motion;Decreased activity tolerance;Decreased safety awareness;Decreased cognition;Impaired balance (sitting and/or standing);Decreased knowledge of use of DME or AE;Decreased knowledge of precautions      OT Treatment/Interventions: Self-care/ADL training;Therapeutic exercise;Therapeutic activities;DME and/or AE instruction;Cognitive remediation/compensation;Patient/family education;Balance training;Manual therapy    OT Goals(Current goals can be found in the care plan section) Acute Rehab OT Goals Patient Stated Goal: pt does not state OT Goal Formulation: Patient unable to participate in goal setting Time For Goal Achievement: 06/05/23 Potential to Achieve Goals: Fair ADL Goals Pt Will Perform Grooming: sitting;with contact guard assist (sitting CGA  with unsupported sitting for for grooming task) Pt Will Perform Lower Body Dressing: with max assist;sitting/lateral leans Pt Will Transfer to Toilet: with max assist;squat pivot transfer;bedside commode Pt Will Perform Toileting - Clothing Manipulation and hygiene: with min assist;sitting/lateral leans  OT Frequency: Min 1X/week    Co-evaluation PT/OT/SLP Co-Evaluation/Treatment: Yes Reason for Co-Treatment: Necessary to address cognition/behavior during functional activity;For patient/therapist safety;To address functional/ADL transfers PT goals addressed during session: Mobility/safety with mobility;Balance OT goals addressed during session: ADL's and self-care      AM-PAC OT "6 Clicks" Daily Activity     Outcome Measure Help from another person eating meals?: None Help from another person taking care of personal grooming?: A Lot Help from another person toileting, which includes using toliet, bedpan, or urinal?: Total Help from another person bathing (including washing, rinsing, drying)?: A Lot Help from another person to put on and taking off regular upper body clothing?: A Lot Help from another person to put  on and taking off regular lower body clothing?: A Lot 6 Click Score: 13   End of Session    Activity Tolerance: No increased pain;Other (comment) (cognition) Patient left: in bed;with call bell/phone within reach;with bed alarm set  OT Visit Diagnosis: Other abnormalities of gait and mobility (R26.89);Muscle weakness (generalized) (M62.81);Pain Pain - Right/Left: Right Pain - part of body: Leg                Time: 2130-8657 OT Time Calculation (min): 15 min Charges:  OT General Charges $OT Visit: 1 Visit OT Evaluation $OT Eval Moderate Complexity: 1 Mod  Arman Filter., MPH, MS, OTR/L ascom [phone] 05/22/23, 11:15 AM

## 2023-05-22 NOTE — Evaluation (Signed)
Physical Therapy Evaluation Patient Details Name: Theresa [name] MRN: [id number] DOB: 08/27/1955 Today's Date: 05/22/2023  History of Present Illness  Pt is s/p R AKA 2/2 R foot and lower leg gangrene. PMH of CAD s/p stent, HTN, COPD, MI, CHF, dementia, DMII, previous ESRD now s/p transplant, HLD, PVD.   Clinical Impression  Pt alert but did not speak to/provide much information during session. With some redirection and extra time, agreeable to try sitting EOB. Required maxAx2 for all mobility attempts and noted for R lateral lean in sitting. Sat for less than 1 minute with mod-maxA, and stated she was done and wanted to lay back down, needed assistance to maintain safety (attempted to return to supine posteriorly, not back into the bed). Repositioned for comfort as able. Unable to provide PLOF but per chart review pt is from a group home. Pt did exhibit pain signs/symptoms with mobility, RN notified but also pt was premedicated prior to session. The patient would benefit from further skilled PT intervention to continue to assess pts ability to meaningfully participate in therapy services.         If plan is discharge home, recommend the following: Two people to help with bathing/dressing/bathroom;Two people to help with walking and/or transfers;Direct supervision/assist for medications management;Help with stairs or ramp for entrance;Assist for transportation;Assistance with cooking/housework;Assistance with feeding;Direct supervision/assist for financial management;Supervision due to cognitive status   Can travel by private vehicle   No    Equipment Recommendations Other (comment) (TBD)  Recommendations for Other Services       Functional Status Assessment Patient has had a recent decline in their functional status and demonstrates the ability to make significant improvements in function in a reasonable and predictable amount of time.     Precautions / Restrictions  Precautions Precautions: Fall Restrictions Weight Bearing Restrictions Per Provider Order: Yes RLE Weight Bearing Per Provider Order: Non weight bearing Other Position/Activity Restrictions: R AKA      Mobility  Bed Mobility Overal bed mobility: Needs Assistance Bed Mobility: Supine to Sit, Sit to Supine     Supine to sit: Max assist, +2 for physical assistance, HOB elevated, Used rails Sit to supine: Max assist, +2 for physical assistance        Transfers                   General transfer comment: unsafe to attempt    Ambulation/Gait                  Stairs            Wheelchair Mobility     Tilt Bed    Modified Rankin (Stroke Patients Only)       Balance Overall balance assessment: Needs assistance   Sitting balance-Leahy Scale: Poor Sitting balance - Comments: poor-; pt required varying MOD A to TOTAL A to maintain upright positioning EOB with heavy R lateral lean Postural control: Right lateral lean                                   Pertinent Vitals/Pain Pain Assessment Pain Assessment: Faces Faces Pain Scale: Hurts little more Pain Location: Pt denies pain at start of session, with bed mobility and slight pressure to residual limb, pt moans out in pain, grimacing Pain Descriptors / Indicators: Grimacing, Moaning, Guarding Pain Intervention(s): Monitored during session, Repositioned, Premedicated before session    Home Living Family/patient expects  to be discharged to:: Group home                   Additional Comments: Per chart, pt from group home, pt poor historian    Prior Function Prior Level of Function : Patient poor historian/Family not available                     Extremity/Trunk Assessment   Upper Extremity Assessment Upper Extremity Assessment: Generalized weakness    Lower Extremity Assessment RLE Deficits / Details: AKA       Communication      Cognition Arousal:  Alert Behavior During Therapy: Flat affect Overall Cognitive Status: History of cognitive impairments - at baseline                                 General Comments: Pt alert, oriented to self, slow processing, difficulty following commands did become agitated with mobility attempts        General Comments      Exercises     Assessment/Plan    PT Assessment Patient needs continued PT services  PT Problem List Decreased strength;Pain;Decreased range of motion;Decreased activity tolerance;Decreased balance;Decreased knowledge of precautions;Decreased mobility;Decreased safety awareness;Decreased knowledge of use of DME       PT Treatment Interventions Neuromuscular re-education;DME instruction;Gait training;Stair training;Patient/family education;Functional mobility training;Therapeutic activities;Therapeutic exercise;Balance training    PT Goals (Current goals can be found in the Care Plan section)  Acute Rehab PT Goals PT Goal Formulation: Patient unable to participate in goal setting Time For Goal Achievement: 06/05/23 Potential to Achieve Goals: Poor    Frequency Min 1X/week     Co-evaluation PT/OT/SLP Co-Evaluation/Treatment: Yes Reason for Co-Treatment: Necessary to address cognition/behavior during functional activity;For patient/therapist safety;To address functional/ADL transfers PT goals addressed during session: Mobility/safety with mobility;Balance OT goals addressed during session: ADL's and self-care       AM-PAC PT "6 Clicks" Mobility  Outcome Measure Help needed turning from your back to your side while in a flat bed without using bedrails?: Total Help needed moving from lying on your back to sitting on the side of a flat bed without using bedrails?: Total Help needed moving to and from a bed to a chair (including a wheelchair)?: Total Help needed standing up from a chair using your arms (e.g., wheelchair or bedside chair)?: Total Help  needed to walk in hospital room?: Total Help needed climbing 3-5 steps with a railing? : Total 6 Click Score: 6    End of Session   Activity Tolerance: Patient limited by pain;Treatment limited secondary to agitation Patient left: in bed;with call bell/phone within reach;with bed alarm set Nurse Communication: Mobility status PT Visit Diagnosis: Other abnormalities of gait and mobility (R26.89);Difficulty in walking, not elsewhere classified (R26.2);Muscle weakness (generalized) (M62.81);Pain Pain - Right/Left: Right Pain - part of body: Hip    Time: 0865-7846 PT Time Calculation (min) (ACUTE ONLY): 14 min   Charges:   PT Evaluation $PT Eval Moderate Complexity: 1 Mod   PT General Charges $$ ACUTE PT VISIT: 1 Visit         Olga Coaster PT, DPT 3:54 PM,05/22/23

## 2023-05-23 DIAGNOSIS — E1152 Type 2 diabetes mellitus with diabetic peripheral angiopathy with gangrene: Secondary | ICD-10-CM | POA: Diagnosis not present

## 2023-05-23 DIAGNOSIS — L97509 Non-pressure chronic ulcer of other part of unspecified foot with unspecified severity: Secondary | ICD-10-CM | POA: Diagnosis not present

## 2023-05-23 DIAGNOSIS — E11621 Type 2 diabetes mellitus with foot ulcer: Secondary | ICD-10-CM | POA: Diagnosis not present

## 2023-05-23 LAB — GLUCOSE, CAPILLARY
Glucose-Capillary: 110 mg/dL — ABNORMAL HIGH (ref 70–99)
Glucose-Capillary: 149 mg/dL — ABNORMAL HIGH (ref 70–99)
Glucose-Capillary: 100 mg/dL — ABNORMAL HIGH (ref 70–99)
Glucose-Capillary: 108 mg/dL — ABNORMAL HIGH (ref 70–99)

## 2023-05-23 LAB — CBC
HCT: 27.1 % — ABNORMAL LOW (ref 36.0–46.0)
Hemoglobin: 9.1 g/dL — ABNORMAL LOW (ref 12.0–15.0)
MCH: 32 pg (ref 26.0–34.0)
MCHC: 33.6 g/dL (ref 30.0–36.0)
MCV: 95.4 fL (ref 80.0–100.0)
Platelets: 79 K/uL — ABNORMAL LOW (ref 150–400)
RBC: 2.84 MIL/uL — ABNORMAL LOW (ref 3.87–5.11)
RDW: 17 % — ABNORMAL HIGH (ref 11.5–15.5)
WBC: 7.2 K/uL (ref 4.0–10.5)
nRBC: 0 % (ref 0.0–0.2)

## 2023-05-23 LAB — BASIC METABOLIC PANEL
Anion gap: 8 (ref 5–15)
BUN: 24 mg/dL — ABNORMAL HIGH (ref 8–23)
CO2: 22 mmol/L (ref 22–32)
Calcium: 8.3 mg/dL — ABNORMAL LOW (ref 8.9–10.3)
Chloride: 103 mmol/L (ref 98–111)
Creatinine, Ser: 1.41 mg/dL — ABNORMAL HIGH (ref 0.44–1.00)
GFR, Estimated: 41 mL/min — ABNORMAL LOW (ref >60)
Glucose, Bld: 98 mg/dL (ref 70–99)
Potassium: 4.4 mmol/L (ref 3.5–5.1)
Sodium: 133 mmol/L — ABNORMAL LOW (ref 135–145)

## 2023-05-23 NOTE — Progress Notes (Signed)
[facility]  ROUNDING NOTE   Subjective:   Sleeping, slow to respond to questions this morning. States she has no complaints.   Creatinine 1.41  Objective:  Vital signs in last 24 hours:  Temp:  [98.1 F (36.7 C)-99.8 F (37.7 C)] 98.1 F (36.7 C) (12/14 0729) Pulse Rate:  [87-96] 92 (12/14 0729) Resp:  [16-21] 16 (12/14 0729) BP: (147-160)/(39-74) 147/74 (12/14 0729) SpO2:  [98 %-100 %] 98 % (12/14 0729)  Weight change:  Filed Weights   05/19/23 1643 05/20/23 1234 05/21/23 1019  Weight: 66.2 kg 65 kg 65 kg    Intake/Output: I/O last 3 completed shifts: In: -  Out: 950 [Urine:950]   Intake/Output this shift:  No intake/output data recorded.  Physical Exam: General: NAD, laying in bed  Head: Normocephalic, atraumatic. Moist oral mucosal membranes  Eyes: Anicteric  Lungs:  Clear to auscultation, normal effort  Heart: Regular rate and rhythm  Abdomen:  Soft, nontender,   Extremities: No peripheral edema.  Rt AKA on 05/21/23  Neurologic: Alert and oriented, moving all four extremities  Skin: No lesions  Access: None    Basic Metabolic Panel: Recent Labs  Lab 05/19/23 1500 05/20/23 0051 05/21/23 0512 05/21/23 1123 05/22/23 0703 05/23/23 0459  NA 132* 131* 136 134* 136 133*  K 3.1* 3.4* 5.8* 5.1 5.1 4.4  CL 94* 96* 104 101 104 103  CO2 26 25 25   --  21* 22  GLUCOSE 134* 135* 84 266* 125* 98  BUN 22 24* 21 18 20  24*  CREATININE 1.56* 1.41* 1.34* 1.40* 1.47* 1.41*  CALCIUM 8.6* 8.2* 8.5*  --  8.5* 8.3*  MG  --   --  1.9  --   --   --     Liver Function Tests: Recent Labs  Lab 05/19/23 1500 05/20/23 0051  AST 36 27  ALT 14 13  ALKPHOS 154* 127*  BILITOT 0.9 0.6  PROT 6.2* 5.3*  ALBUMIN 1.8* 1.6*   No results for input(s): "LIPASE", "AMYLASE" in the last 168 hours. No results for input(s): "AMMONIA" in the last 168 hours.  CBC: Recent Labs  Lab 05/19/23 1500 05/20/23 0051 05/20/23 0927 05/21/23 0512 05/21/23 1123  05/21/23 1455 05/22/23 0703 05/23/23 0459  WBC 5.6   < > 4.3 4.8  --  5.3 7.8 7.2  NEUTROABS 4.0  --   --   --   --   --   --   --   HGB 9.3*   < > 8.3* 7.1* 7.1* 10.6* 9.9* 9.1*  HCT 28.5*   < > 26.1* 22.2* 21.0* 32.1* 30.2* 27.1*  MCV 98.3   < > 101.2* 100.0  --  96.7 96.5 95.4  PLT 136*   < > 117* 103*  --  86* 82* 79*   < > = values in this interval not displayed.    Cardiac Enzymes: No results for input(s): "CKTOTAL", "CKMB", "CKMBINDEX", "TROPONINI" in the last 168 hours.  BNP: Invalid input(s): "POCBNP"  CBG: Recent Labs  Lab 05/22/23 1208 05/22/23 1552 05/22/23 2057 05/22/23 2126 05/23/23 0731  GLUCAP 186* 310* 44* 175* 110*    Microbiology: Results for orders placed or performed during the hospital encounter of 05/19/23  Surgical pcr screen     Status: None   Collection Time: 05/21/23  3:34 AM   Specimen: Nasal Mucosa; Nasal Swab  Result Value Ref Range Status   MRSA, PCR NEGATIVE NEGATIVE Final   Staphylococcus aureus NEGATIVE NEGATIVE Final  Comment: (NOTE) The Xpert SA Assay (FDA approved for NASAL specimens in patients 68 years of age and older), is one component of a comprehensive surveillance program. It is not intended to diagnose infection nor to guide or monitor treatment. Performed at [facility], [address] [place], [place] [address]     Coagulation Studies: No results for input(s): "LABPROT", "INR" in the last 72 hours.   Urinalysis: No results for input(s): "COLORURINE", "LABSPEC", "PHURINE", "GLUCOSEU", "HGBUR", "BILIRUBINUR", "KETONESUR", "PROTEINUR", "UROBILINOGEN", "NITRITE", "LEUKOCYTESUR" in the last 72 hours.  Invalid input(s): "APPERANCEUR"    Imaging: No results found.    Medications:     (feeding supplement) PROSource Plus  30 mL Oral TID BM   vitamin C  500 mg Oral BID   atorvastatin  20 mg Oral Daily   azaTHIOprine  100 mg Oral Daily   Chlorhexidine Gluconate Cloth  6 each Topical Daily    cloNIDine  0.1 mg Oral Daily   clopidogrel  75 mg Oral Daily   enoxaparin (LOVENOX) injection  30 mg Subcutaneous Q24H   feeding supplement  1 Container Oral TID BM   insulin aspart  0-5 Units Subcutaneous QHS   insulin aspart  0-9 Units Subcutaneous TID WC   isosorbide mononitrate  30 mg Oral Daily   leptospermum manuka honey  1 Application Topical Daily   multivitamin with minerals  1 tablet Oral Daily   pantoprazole  40 mg Oral Daily   pregabalin  75 mg Oral BID   tacrolimus ER  10 mg Oral QAC breakfast   zinc sulfate (50mg  elemental zinc)  220 mg Oral Daily   ipratropium-albuterol, morphine injection, oxyCODONE  Assessment/ Plan:  [name] is a 67 y.o.  female with past medical conditions including COPD, anemia, diabetes, CAD, hyperlipidemia, status post renal transplant, hypertension, who was admitted to [facility] on 05/19/2023 for Arterial occlusion [I70.90] Diabetes mellitus with foot ulcer and gangrene (HCC) [W09.811, L97.509, E11.52] Gangrene of right foot (HCC) [I96]   Chronic kidney disease stage IIIb, status post cadaver renal transplant completed at [facility] on 03/30/2019.  Baseline creatinine 1-1.5.  Immunosuppressive medications include tacrolimus and Imuran.  Creatinine remains acceptable. Will continue to monitor  Lab Results  Component Value Date   CREATININE 1.41 (H) 05/23/2023   CREATININE 1.47 (H) 05/22/2023   CREATININE 1.40 (H) 05/21/2023    Intake/Output Summary (Last 24 hours) at 05/23/2023 0955 Last data filed at 05/23/2023 0700 Gross per 24 hour  Intake --  Output 600 ml  Net -600 ml    2.  Hypertension with chronic kidney disease.  Home regimen includes furosemide, clonidine, and isosorbide.  Furosemide currently held.  Blood pressure stable  4. Anemia of chronic kidney disease Lab Results  Component Value Date   HGB 9.1 (L) 05/23/2023    Hemoglobin 9.9   LOS: 4 Theresa [name] 12/14/20249:55 AM

## 2023-05-23 NOTE — Progress Notes (Addendum)
Progress Note   Patient: Theresa [name] ZYS:[id number] DOB: [birth year] DOA: 05/19/2023     4 DOS: the patient was seen and examined on 05/23/2023   Brief hospital course: HPI on admission: "Theresa [name] is a 67 y.o. female with medical history significant of  Anemia of chronic disease, COPD coronary artery disease status post stent placement, Diabetes mellitus with diabetic neuropathy and retinopathy, previous history of ESRD on hemodialysis currently status post transplant, hyperlipidemia, Hypertension moderate to severe mitral regurgitation, peripheral vascular disease who is a resident in a group home brought in on account of worsening gangrene noted on the right foot.  ..." See H&P for full HPI on admission & ED course.  Patient was admitted to the hospital, started on IV heparin and Vascular Surgery was consulted, plan for angiogram today (12/11).  Further hospital course and management as outlined below.   Assessment and Plan:  Right foot gangrene No signs of infection identified, no antibiotics indicated at this time --Initially on heparin drip, now stopped --Vascular surgery following, Dr. Wyn Quaker --12/11 -- underwent angiogram with angioplasty to right SFA and popliteal arteries --12/12 -- underwent Right AKA --Initial dressing change by vascular this weekend --Started on Plavix 75 mg daily --Lovenox for DVT ppx started --OOB to chair --PT/OT evaluations -- return to SNF anticipated --Follow up Vascular's recommendations   Hypokalemia - resolved with replacement --Monitor BMP, replace K PRN  Hyperkalemia - K 5.8 on 12/12 treated and resolved --Monitor BMP   Hypovolemic hyponatremia - mild, Na 132 on admission.   Pt appeared clinically dehydrated. --Encourage oral hydration --Monitor BMP   Anemia of chronic disease - Hbg stable --Monitor CBC   COPD - stable, not exacerbated --PRN bronchodilators   Coronary artery disease status post stent placement --Continue  statin therapy   Diabetes mellitus with diabetic neuropathy and retinopathy --Monitor CBG's --Sliding scale Novolog --Continue Lyrica  Hypoglycemia - CBG noted 44 on evening of 12/13.  Treated an improved to 175 --Hypoglycemia protocol   History of ESRD  Patient used to be on on hemodialysis currently status post transplant No longer on hemodialysis Renal function stable at baseline --Resumed home dose of tacrolimus --Nephrology following   Severe hypoalbuminemia --Dietitian consulted   Hyperlipidemia --Lipitor  Hypertension  --On clonidone and Imdur  Moderate to severe mitral regurgitation --Monitor volume status  Peripheral vascular disease, now s/p R AKA --Started on Plavix per vascular       Subjective: Pt awake resting in bed, moaning in pain.  She reports no improvement with morphine yet, given about an hour ago.  Pt begging for relief from pain.  Denies other complaints.     Physical Exam: Vitals:   05/22/23 2357 05/23/23 0419 05/23/23 0419 05/23/23 0729  BP: (!) 152/67 (!) 158/53 (!) 158/53 (!) 147/74  Pulse: 88 87 87 92  Resp: 18 (!) 21 (!) 21 16  Temp: 98.9 F (37.2 C) 99.8 F (37.7 C) 99.8 F (37.7 C) 98.1 F (36.7 C)  TempSrc:      SpO2: 98% 100% 100% 98%  Weight:      Height:       General exam: awake, alert, no acute distress, chronically ill appearing HEENT: moist mucus membranes, hearing grossly normal  Respiratory system: lungs clear no wheezes or rhonchi, on room air, normal respiratory effort. Cardiovascular system: normal S1/S2, RRR, no pedal edema.   Gastrointestinal system: soft, NT, ND Central nervous system: A&O x. no gross focal neurologic deficits, normal speech Extremities: right AKA  with occlusive dressing in place no edema, normal tone Psychiatry: normal mood, congruent affect   Data Reviewed:  Notable labs --     Na 133 BUN 24 Cr 1.40 >> 1.47 >> 1.41 stable Ca 8.3 Hbg 9.1 stable Platelets 103 >> 86 >> 82 >>  79k   Family Communication: None present on rounds. Will attempt to call as time allows.   Disposition: Status is: Inpatient Remains inpatient appropriate because: vascular surgery today, on IV therapies, ongoing evaluation   Planned Discharge Destination: return to prior home environment    Time spent: 38 minutes  Author: Pennie Banter, DO 05/23/2023 12:25 PM  For on call review [email or web address].

## 2023-05-23 NOTE — Plan of Care (Signed)

## 2023-05-24 DIAGNOSIS — L97509 Non-pressure chronic ulcer of other part of unspecified foot with unspecified severity: Secondary | ICD-10-CM | POA: Diagnosis not present

## 2023-05-24 DIAGNOSIS — E11621 Type 2 diabetes mellitus with foot ulcer: Secondary | ICD-10-CM | POA: Diagnosis not present

## 2023-05-24 DIAGNOSIS — E1152 Type 2 diabetes mellitus with diabetic peripheral angiopathy with gangrene: Secondary | ICD-10-CM | POA: Diagnosis not present

## 2023-05-24 LAB — BASIC METABOLIC PANEL
Anion gap: 9 (ref 5–15)
BUN: 24 mg/dL — ABNORMAL HIGH (ref 8–23)
CO2: 22 mmol/L (ref 22–32)
Calcium: 8.4 mg/dL — ABNORMAL LOW (ref 8.9–10.3)
Chloride: 102 mmol/L (ref 98–111)
Creatinine, Ser: 1.26 mg/dL — ABNORMAL HIGH (ref 0.44–1.00)
GFR, Estimated: 47 mL/min — ABNORMAL LOW (ref >60)
Glucose, Bld: 100 mg/dL — ABNORMAL HIGH (ref 70–99)
Potassium: 4.7 mmol/L (ref 3.5–5.1)
Sodium: 133 mmol/L — ABNORMAL LOW (ref 135–145)

## 2023-05-24 LAB — CBC
HCT: 28.2 % — ABNORMAL LOW (ref 36.0–46.0)
Hemoglobin: 9.3 g/dL — ABNORMAL LOW (ref 12.0–15.0)
MCH: 32 pg (ref 26.0–34.0)
MCHC: 33 g/dL (ref 30.0–36.0)
MCV: 96.9 fL (ref 80.0–100.0)
Platelets: 74 10*3/uL — ABNORMAL LOW (ref 150–400)
RBC: 2.91 MIL/uL — ABNORMAL LOW (ref 3.87–5.11)
RDW: 16.8 % — ABNORMAL HIGH (ref 11.5–15.5)
WBC: 7.7 10*3/uL (ref 4.0–10.5)
nRBC: 0 % (ref 0.0–0.2)

## 2023-05-24 LAB — GLUCOSE, CAPILLARY
Glucose-Capillary: 111 mg/dL — ABNORMAL HIGH (ref 70–99)
Glucose-Capillary: 122 mg/dL — ABNORMAL HIGH (ref 70–99)
Glucose-Capillary: 95 mg/dL (ref 70–99)
Glucose-Capillary: 86 mg/dL (ref 70–99)

## 2023-05-24 MED ORDER — ENOXAPARIN SODIUM 40 MG/0.4ML IJ SOSY: 40.0000 mg | PREFILLED_SYRINGE | INTRAMUSCULAR | Status: DC

## 2023-05-24 MED ORDER — CLONIDINE HCL 0.1 MG PO TABS
0.1000 mg | ORAL_TABLET | Freq: Two times a day (BID) | ORAL | Status: DC
Start: 2023-05-24 — End: 2023-06-08
  Administered 2023-05-24 – 2023-06-07 (×23): 0.1 mg via ORAL
  Filled 2023-05-24 (×29): qty 1

## 2023-05-24 NOTE — Consult Note (Signed)
PHARMACIST - PHYSICIAN COMMUNICATION  CONCERNING:  Enoxaparin (Lovenox) for DVT Prophylaxis    RECOMMENDATION: Patient was prescribed enoxaprin 40mg  q24 hours for VTE prophylaxis.   Filed Weights   05/19/23 1643 05/20/23 1234 05/21/23 1019  Weight: 66.2 kg (146 lb) 65 kg (143 lb 4.8 oz) 65 kg (143 lb 4.8 oz)    Body mass index is 22.44 kg/m.  Estimated Creatinine Clearance: 43.3 mL/min (A) (by C-G formula based on SCr of 1.26 mg/dL (H)).   Patient is candidate for enoxaparin 40mg  every 24 hours based on CrCl >95ml/min and Weight >45kg  DESCRIPTION: Pharmacy has adjusted enoxaparin dose per [facility] policy.  Patient is now receiving enoxaparin 40 mg every 24 hours   Celene Squibb, PharmD Clinical Pharmacist 05/24/2023 9:54 AM

## 2023-05-24 NOTE — TOC Progression Note (Signed)
Transition of Care [facility]) - Progression Note    Patient Details  Name: Theresa [name] MRN: [id number] Date of Birth: 1955/10/18  Transition [facility]) CM/SW Contact  Bing Quarry, RN Phone Number: 05/24/2023, 2:34 PM  Clinical Narrative: 12/15: Brought to ED from a Group Home 05/19/23 for dx'd RIGHT foot gangrene. Now POD#3 RIGHT AKA.Marland Kitchen Also RIGHT groin wound, Stage 3 sacral wound, Unstageable LEFT later buttocks per Wound Care consult. Was on HD before but not now; post transplant. Nephrology following.   So far, PT rec SNF but patient was unable to ambulate due to pain and was not very verbal per PT notes on 05/22/23. Max assist x2 for bed mobility at that time as well.  No further PT notes since that time.   Per nursing, oriented to person and place only today, but not to time or situation.   Care everywhere shows patient was active with [facility] in October 2024.   Nelda Severe, son, listed as in same household. Home or Group Home? Attempted to reach out to him but had to leave a confidential VM.   Per DPR:  Authorized to discuss medical info with Earle Gell (daughter) [phone] or Andria Rhein (granddaughter) [phone].  Contacted [employer] and she answered but stated wrong number and to please take her off the call list though she identified herself as above person.   Attempted to reach [facility] but number not in service.   TOC to follow for disposition.    Gabriel Cirri MSN RN CM  Care Management Department.  [place]  [facility] Direct Dial: [phone] Main Office Phone: [phone] Weekends Only           Expected Discharge Plan and Services                                               Social Determinants of Health (SDOH) Interventions SDOH Screenings   Food Insecurity: Patient Unable To Answer (05/20/2023)  Housing: High Risk (05/20/2023)  Transportation Needs: Patient Unable To Answer  (05/20/2023)  Utilities: Patient Unable To Answer (05/20/2023)  Alcohol Screen: Low Risk  (12/17/2022)  Depression (PHQ2-9): Medium Risk (12/31/2022)  Financial Resource Strain: Low Risk  (02/13/2023)   Received from [facility]  Physical Activity: Inactive (12/17/2022)  Social Connections: Socially Isolated (12/17/2022)  Stress: No Stress Concern Present (12/17/2022)  Tobacco Use: Medium Risk (05/21/2023)  Health Literacy: Low Risk  (09/13/2020)   Received from [facility]    Readmission Risk Interventions     No data to display

## 2023-05-24 NOTE — Progress Notes (Signed)
[facility]  ROUNDING NOTE   Subjective:   Patient with pain this morning.   Creatinine 1.26.  Objective:  Vital signs in last 24 hours:  Temp:  [97.9 F (36.6 C)-99.6 F (37.6 C)] 98.3 F (36.8 C) (12/15 0819) Pulse Rate:  [88-94] 88 (12/15 0819) Resp:  [16-20] 18 (12/15 0405) BP: (105-179)/(45-65) 149/57 (12/15 0819) SpO2:  [98 %-100 %] 100 % (12/15 0819)  Weight change:  Filed Weights   05/19/23 1643 05/20/23 1234 05/21/23 1019  Weight: 66.2 kg 65 kg 65 kg    Intake/Output: I/O last 3 completed shifts: In: -  Out: 600 [Urine:600]   Intake/Output this shift:  No intake/output data recorded.  Physical Exam: General: NAD, laying in bed  Head: Normocephalic, atraumatic. Moist oral mucosal membranes  Eyes: Anicteric  Lungs:  Clear to auscultation, normal effort  Heart: Regular rate and rhythm  Abdomen:  Soft, nontender,   Extremities: No peripheral edema.  Rt AKA on 05/21/23. Dressing clean, dry and intact  Neurologic: Alert and oriented, moving all four extremities  Skin: No lesions  Access: None    Basic Metabolic Panel: Recent Labs  Lab 05/20/23 0051 05/21/23 0512 05/21/23 1123 05/22/23 0703 05/23/23 0459 05/24/23 0552  NA 131* 136 134* 136 133* 133*  K 3.4* 5.8* 5.1 5.1 4.4 4.7  CL 96* 104 101 104 103 102  CO2 25 25  --  21* 22 22  GLUCOSE 135* 84 266* 125* 98 100*  BUN 24* 21 18 20  24* 24*  CREATININE 1.41* 1.34* 1.40* 1.47* 1.41* 1.26*  CALCIUM 8.2* 8.5*  --  8.5* 8.3* 8.4*  MG  --  1.9  --   --   --   --     Liver Function Tests: Recent Labs  Lab 05/19/23 1500 05/20/23 0051  AST 36 27  ALT 14 13  ALKPHOS 154* 127*  BILITOT 0.9 0.6  PROT 6.2* 5.3*  ALBUMIN 1.8* 1.6*   No results for input(s): "LIPASE", "AMYLASE" in the last 168 hours. No results for input(s): "AMMONIA" in the last 168 hours.  CBC: Recent Labs  Lab 05/19/23 1500 05/20/23 0051 05/21/23 0512 05/21/23 1123 05/21/23 1455 05/22/23 0703  05/23/23 0459 05/24/23 0552  WBC 5.6   < > 4.8  --  5.3 7.8 7.2 7.7  NEUTROABS 4.0  --   --   --   --   --   --   --   HGB 9.3*   < > 7.1* 7.1* 10.6* 9.9* 9.1* 9.3*  HCT 28.5*   < > 22.2* 21.0* 32.1* 30.2* 27.1* 28.2*  MCV 98.3   < > 100.0  --  96.7 96.5 95.4 96.9  PLT 136*   < > 103*  --  86* 82* 79* 74*   < > = values in this interval not displayed.    Cardiac Enzymes: No results for input(s): "CKTOTAL", "CKMB", "CKMBINDEX", "TROPONINI" in the last 168 hours.  BNP: Invalid input(s): "POCBNP"  CBG: Recent Labs  Lab 05/23/23 0731 05/23/23 1235 05/23/23 1638 05/23/23 2058 05/24/23 0822  GLUCAP 110* 149* 100* 108* 111*    Microbiology: Results for orders placed or performed during the hospital encounter of 05/19/23  Surgical pcr screen     Status: None   Collection Time: 05/21/23  3:34 AM   Specimen: Nasal Mucosa; Nasal Swab  Result Value Ref Range Status   MRSA, PCR NEGATIVE NEGATIVE Final   Staphylococcus aureus NEGATIVE NEGATIVE Final    Comment: (NOTE) The  Xpert SA Assay (FDA approved for NASAL specimens in patients 44 years of age and older), is one component of a comprehensive surveillance program. It is not intended to diagnose infection nor to guide or monitor treatment. Performed at [facility], [address] [place], [place] [address]     Coagulation Studies: No results for input(s): "LABPROT", "INR" in the last 72 hours.   Urinalysis: No results for input(s): "COLORURINE", "LABSPEC", "PHURINE", "GLUCOSEU", "HGBUR", "BILIRUBINUR", "KETONESUR", "PROTEINUR", "UROBILINOGEN", "NITRITE", "LEUKOCYTESUR" in the last 72 hours.  Invalid input(s): "APPERANCEUR"    Imaging: No results found.    Medications:     (feeding supplement) PROSource Plus  30 mL Oral TID BM   vitamin C  500 mg Oral BID   atorvastatin  20 mg Oral Daily   azaTHIOprine  100 mg Oral Daily   Chlorhexidine Gluconate Cloth  6 each Topical Daily   cloNIDine  0.1 mg Oral  BID   clopidogrel  75 mg Oral Daily   enoxaparin (LOVENOX) injection  40 mg Subcutaneous Q24H   feeding supplement  1 Container Oral TID BM   insulin aspart  0-5 Units Subcutaneous QHS   insulin aspart  0-9 Units Subcutaneous TID WC   isosorbide mononitrate  30 mg Oral Daily   leptospermum manuka honey  1 Application Topical Daily   multivitamin with minerals  1 tablet Oral Daily   pantoprazole  40 mg Oral Daily   pregabalin  75 mg Oral BID   tacrolimus ER  10 mg Oral QAC breakfast   zinc sulfate (50mg  elemental zinc)  220 mg Oral Daily   ipratropium-albuterol, morphine injection, oxyCODONE  Assessment/ Plan:  Theresa [name] is a 67 y.o.  female with past medical conditions including COPD, anemia, diabetes, CAD, hyperlipidemia, status post renal transplant, hypertension, who was admitted to [facility] on 05/19/2023 for Arterial occlusion [I70.90] Diabetes mellitus with foot ulcer and gangrene (HCC) [Q25.956, L97.509, E11.52] Gangrene of right foot (HCC) [I96]   Chronic kidney disease stage IIIb, status post cadaver renal transplant completed at [facility] on 03/30/2019.  Baseline creatinine 1-1.5.   - Immunosuppressive medications: tacrolimus and azothiaprine.  - no indication for tacrolimus level at this time.   Lab Results  Component Value Date   CREATININE 1.26 (H) 05/24/2023   CREATININE 1.41 (H) 05/23/2023   CREATININE 1.47 (H) 05/22/2023   No intake or output data in the 24 hours ending 05/24/23 1154   2.  Hypertension with chronic kidney disease.  Home regimen includes furosemide, clonidine, and isosorbide.  Furosemide currently held.  Blood pressure stable  4. Anemia of chronic kidney disease Lab Results  Component Value Date   HGB 9.3 (L) 05/24/2023     LOS: 5 [name] 12/15/202411:54 AM

## 2023-05-24 NOTE — Plan of Care (Signed)

## 2023-05-24 NOTE — Progress Notes (Addendum)
Progress Note   Patient: Theresa [name] ZOX:[id number] DOB: 02-13-1956 DOA: 05/19/2023     5 DOS: the patient was seen and examined on 05/24/2023   Brief hospital course: HPI on admission: "Theresa [name] is a 67 y.o. female with medical history significant of  Anemia of chronic disease, COPD coronary artery disease status post stent placement, Diabetes mellitus with diabetic neuropathy and retinopathy, previous history of ESRD on hemodialysis currently status post transplant, hyperlipidemia, Hypertension moderate to severe mitral regurgitation, peripheral vascular disease who is a resident in a group home brought in on account of worsening gangrene noted on the right foot.  ..." See H&P for full HPI on admission & ED course.  Patient was admitted to the hospital, started on IV heparin and Vascular Surgery was consulted, plan for angiogram today (12/11).  Further hospital course and management as outlined below.   Assessment and Plan:  Right foot gangrene No signs of infection identified, no antibiotics indicated at this time --Initially on heparin drip --Vascular surgery following, Dr. Wyn Quaker --12/11 -- underwent angiogram with angioplasty to right SFA and popliteal arteries --12/12 -- underwent Right AKA --Initial dressing change by vascular this weekend --Started on Plavix 75 mg daily --Lovenox for DVT ppx started - dc given dropping platelets --OOB to chair --PT/OT evaluations -- return to SNF anticipated --Follow up Vascular's recommendations  Thrombocytopenia -- appears acute on chronic. Worsening since admission, platelets now in 70's --Hold off Lovenox --Also on Plavix - continue for now --Check HIT Ab --Daily CBC's to monitor   Hypokalemia - resolved with replacement --Monitor BMP, replace K PRN  Hyperkalemia - K 5.8 on 12/12 treated and resolved --Monitor BMP   Hypovolemic hyponatremia - mild, Na 132 on admission.   Pt appeared clinically dehydrated. --Encourage  oral hydration --Monitor BMP   Anemia of chronic disease - Hbg stable --Monitor CBC   COPD - stable, not exacerbated --PRN bronchodilators   Coronary artery disease status post stent placement --Continue statin therapy   Diabetes mellitus with diabetic neuropathy and retinopathy --Monitor CBG's --Sliding scale Novolog --Continue Lyrica  Hypoglycemia - CBG noted 44 on evening of 12/13.  Treated an improved to 175 --Hypoglycemia protocol   History of ESRD  Patient used to be on on hemodialysis currently status post transplant No longer on hemodialysis Renal function stable at baseline --On tacrolimus --Nephrology following   Severe hypoalbuminemia --Dietitian consulted   Hyperlipidemia --Lipitor  Hypertension  --On clonidone and Imdur --Increased clonidine 0.1 mg from daily to BID for improved BP control --Monitor & adjust regimen as needed  Moderate to severe mitral regurgitation --Monitor volume status  Peripheral vascular disease, now s/p R AKA --Started on Plavix per vascular       Subjective: Pt awake resting in bed, moaning in pain.  She reports no improvement with morphine yet, given about an hour ago.  Pt begging for relief from pain.  Denies other complaints.     Physical Exam: Vitals:   05/24/23 0123 05/24/23 0405 05/24/23 0431 05/24/23 0819  BP: (!) 169/57 (!) 179/64 (!) 172/65 (!) 149/57  Pulse: 94 89 90 88  Resp: 20 18    Temp: 98.4 F (36.9 C) 99.6 F (37.6 C)  98.3 F (36.8 C)  TempSrc:      SpO2: 99% 100%  100%  Weight:      Height:       General exam: awake, alert, no acute distress, chronically ill appearing HEENT: moist mucus membranes, hearing grossly normal  Respiratory system: lungs clear no wheezes or rhonchi, on room air, normal respiratory effort. Cardiovascular system: normal S1/S2, RRR, no pedal edema.   Gastrointestinal system: soft, NT, ND Central nervous system: A&O x. no gross focal neurologic deficits, normal  speech Extremities: right AKA with occlusive dressing in place no edema, normal tone Psychiatry: normal mood, congruent affect   Data Reviewed:  Notable labs --     Na 133 BUN 24 Cr 1.40 >> 1.47 >> 1.41 >> 1.26 Ca 8.4 Hbg 9.3 Platelets 103 >> 86 >> 82 >> 79 >> 74k   Family Communication: None present on rounds.    Disposition: Status is: Inpatient Remains inpatient appropriate because: needs SNF placement pending clearance by vascular surgery   Planned Discharge Destination: return to prior home environment    Time spent: 36 minutes  Author: Pennie Banter, DO 05/24/2023 12:13 PM  For on call review [email or web address].

## 2023-05-25 DIAGNOSIS — Z940 Kidney transplant status: Principal | ICD-10-CM

## 2023-05-25 DIAGNOSIS — E1152 Type 2 diabetes mellitus with diabetic peripheral angiopathy with gangrene: Secondary | ICD-10-CM | POA: Diagnosis not present

## 2023-05-25 DIAGNOSIS — L97509 Non-pressure chronic ulcer of other part of unspecified foot with unspecified severity: Secondary | ICD-10-CM | POA: Diagnosis not present

## 2023-05-25 DIAGNOSIS — E11621 Type 2 diabetes mellitus with foot ulcer: Secondary | ICD-10-CM | POA: Diagnosis not present

## 2023-05-25 LAB — CBC
HCT: 26.4 % — ABNORMAL LOW (ref 36.0–46.0)
Hemoglobin: 8.6 g/dL — ABNORMAL LOW (ref 12.0–15.0)
MCH: 32.3 pg (ref 26.0–34.0)
MCHC: 32.6 g/dL (ref 30.0–36.0)
MCV: 99.2 fL (ref 80.0–100.0)
Platelets: 76 10*3/uL — ABNORMAL LOW (ref 150–400)
RBC: 2.66 MIL/uL — ABNORMAL LOW (ref 3.87–5.11)
RDW: 16.8 % — ABNORMAL HIGH (ref 11.5–15.5)
WBC: 5.8 10*3/uL (ref 4.0–10.5)
nRBC: 0 % (ref 0.0–0.2)

## 2023-05-25 LAB — GLUCOSE, CAPILLARY
Glucose-Capillary: 107 mg/dL — ABNORMAL HIGH (ref 70–99)
Glucose-Capillary: 156 mg/dL — ABNORMAL HIGH (ref 70–99)
Glucose-Capillary: 65 mg/dL — ABNORMAL LOW (ref 70–99)
Glucose-Capillary: 112 mg/dL — ABNORMAL HIGH (ref 70–99)
Glucose-Capillary: 100 mg/dL — ABNORMAL HIGH (ref 70–99)

## 2023-05-25 LAB — SURGICAL PATHOLOGY

## 2023-05-25 MED ORDER — HALOPERIDOL LACTATE 5 MG/ML IJ SOLN
2.0000 mg | Freq: Once | INTRAMUSCULAR | Status: AC
  Administered 2023-05-25: 2 mg via INTRAVENOUS
  Filled 2023-05-25: qty 1

## 2023-05-25 MED ORDER — LOSARTAN POTASSIUM 25 MG PO TABS
25.0000 mg | ORAL_TABLET | Freq: Every day | ORAL | Status: DC
  Administered 2023-05-26 – 2023-06-07 (×12): 25 mg via ORAL
  Filled 2023-05-25 (×15): qty 1

## 2023-05-25 MED ORDER — HYDRALAZINE HCL 20 MG/ML IJ SOLN
10.0000 mg | Freq: Four times a day (QID) | INTRAMUSCULAR | Status: DC | PRN
  Administered 2023-05-25: 10 mg via INTRAVENOUS
  Filled 2023-05-25: qty 1

## 2023-05-25 MED ORDER — METOPROLOL TARTRATE 25 MG PO TABS
12.5000 mg | ORAL_TABLET | Freq: Two times a day (BID) | ORAL | Status: DC
  Administered 2023-05-25 – 2023-05-26 (×2): 12.5 mg via ORAL
  Filled 2023-05-25 (×3): qty 1

## 2023-05-25 MED ORDER — VITAMIN B-12 1000 MCG PO TABS
500.0000 ug | ORAL_TABLET | Freq: Every day | ORAL | Status: DC
  Administered 2023-05-26 – 2023-06-07 (×11): 500 ug via ORAL
  Filled 2023-05-25 (×14): qty 1
  Filled 2023-05-25: qty 0.5

## 2023-05-25 MED ORDER — ORAL CARE MOUTH RINSE: 15.0000 mL | OROMUCOSAL | Status: DC | PRN

## 2023-05-25 MED ORDER — VITAMIN D (ERGOCALCIFEROL) 1.25 MG (50000 UNIT) PO CAPS
50000.0000 [IU] | ORAL_CAPSULE | ORAL | Status: DC
  Administered 2023-06-01: 50000 [IU] via ORAL
  Filled 2023-05-25 (×2): qty 1

## 2023-05-25 MED ORDER — FOLIC ACID 1 MG PO TABS
1.0000 mg | ORAL_TABLET | Freq: Every day | ORAL | Status: DC
  Administered 2023-05-26 – 2023-06-07 (×12): 1 mg via ORAL
  Filled 2023-05-25 (×15): qty 1

## 2023-05-25 NOTE — Progress Notes (Signed)
Nutrition Follow-up  DOCUMENTATION CODES:   Not applicable  INTERVENTION:   -Continue regular diet -Continue MVI with minerals daily -Continue 500 mg vitamin C BID -Continue 220 mg zinc sulfate daily x 14 days -Continue Boost Breeze po TID, each supplement provides 250 kcal and 9 grams of protein  -D/c 30 ml Prosource Plus TID, each supplement provides 100 kcals and 15 grams protein -Draw and monitor for micronutrient deficiencies which may impair optimal wound healing: vitamin A -Magic cup TID with meals, each supplement provides 290 kcal and 9 grams of protein  -Feeding assistance with meals   NUTRITION DIAGNOSIS:   Increased nutrient needs related to wound healing as evidenced by estimated needs.  Ongoing  GOAL:   Patient will meet greater than or equal to 90% of their needs  Progressing   MONITOR:   PO intake, Supplement acceptance  REASON FOR ASSESSMENT:   Consult Assessment of nutrition requirement/status  ASSESSMENT:   Pt with medical history significant of   Anemia of chronic disease, COPD  coronary artery disease status post stent placement, Diabetes mellitus with diabetic neuropathy and retinopathy, previous history of ESRD on hemodialysis currently status post transplant, hyperlipidemia, Hypertension moderate to severe mitral regurgitation, peripheral vascular disease who is a resident in a group home brought in on account of worsening gangrene noted on the right foot.  12/11- s/p Procedure(s): Lower Extremity Angiography (Right) 12/12- s/p rt AKA  Reviewed I/O's: -900 ml x 24 hours and -3.3 L since admission  UOP: 1.1 L x 24 hours   Noted pt was active with hospice PTA. Per MD notes, likely plan to d/c to SNF once medically stable.   Pt sleeping soundly at time of visit. She did not respond to voice or touch.   Pt remains with poor oral intake. Pt also with minimal acceptance of supplements- she has been refusing Prosource. Noted two Boost Breeze  supplements on tray table- one unopened- once with about 25% consumed. Noted Pepsi can also on tray table.   No new wt since last visit.   Medications reviewed and include vitamin C, vitamin B-12, folic acid, medihoney, protonix, vitamin D, and zinc sulfate.    Labs reviewed: Na: 133, CBGS: 86-122 (inpatient orders for glycemic control are 0-5 units insulin aspart daily at bedtime and 0-9 units insulin aspart TID with meals). Vitamin A level still pending.   Diet Order:   Diet Order             Diet regular Room service appropriate? Yes; Fluid consistency: Thin  Diet effective now                   EDUCATION NEEDS:   Education needs have been addressed  Skin:  Skin Assessment: Skin Integrity Issues: Skin Integrity Issues:: Incisions, Stage III, Unstageable Stage III: sacrum Unstageable: lt lateral buttock x 2 Incisions: s/p rt AKA on 05/21/23  Last BM:  05/23/23 (type 6)  Height:   Ht Readings from Last 1 Encounters:  05/21/23 5\' 7"  (1.702 m)    Weight:   Wt Readings from Last 1 Encounters:  05/21/23 65 kg    Ideal Body Weight:  56.5 kg (adjusted for rt AKA)  BMI:  Body mass index is 22.44 kg/m.  Estimated Nutritional Needs:   Kcal:  1700-1900  Protein:  90-105 grams  Fluid:  > 1.7 L    Levada Schilling, RD, LDN, CDCES Registered Dietitian III Certified Diabetes Care and Education Specialist If unable to reach this RD,  please use "RD Inpatient" group chat on secure chat between hours of 8am-4 pm daily

## 2023-05-25 NOTE — Progress Notes (Signed)
Hypoglycemic Event  CBG: 65  Treatment: 4 oz juice/soda  Symptoms: None  Follow-up CBG: Time:1530 CBG Result:see next note; change of shift  Possible Reasons for Event: Unknown  Comments/MD notified: n/a    Toniann Ket

## 2023-05-25 NOTE — Progress Notes (Addendum)
Haldol, hydralazine, and morphine given per verbal order from Dr. Lucianne Muss. Patient pulled one of her IVs out. Patient states she is "going home" and continues to attempt to remove gown. Redirected, encouraged patient to rest.

## 2023-05-25 NOTE — Progress Notes (Signed)
Progress Note    05/25/2023 1:02 PM 4 Days Post-Op  Subjective: Theresa [name] is a 67 year old female now postop day 4 from a right above-the-knee amputation.  Patient is resting comfortably in bed this morning.  Dressing remains Clean dry and intact.  Her pain control seems to be adequate at this point in time although the she does endorse having some incisional pain.  No major events overnight vitals are remained stable.   Vitals:   05/25/23 0821 05/25/23 1214  BP: (!) 174/54 (!) 154/47  Pulse: 81 76  Resp: 14   Temp: 98.2 F (36.8 C)   SpO2: 100% 100%   Physical Exam: Cardiac:  normal S1/S2, RRR, no JVD, murmurs, rubs, gallops, no pedal edema  Lungs:  CTAB, no wheezes, rales or rhonchi, normal respiratory effort.  Incisions:  Right Above the knee amputation with dressing. Clean dry and intact.  Extremities:  Left lower extremity warm to touch with doppler pulses. Right Lower extremity AKA. Dressing clean dry and intact. No drainage to note.  Abdomen:  Positive bowel sounds throughout, soft, non tender and non distended. Neurologic: AAOX3 answers all questions and follows commands appropriately.  CBC    Component Value Date/Time   WBC 5.8 05/25/2023 0645   RBC 2.66 (L) 05/25/2023 0645   HGB 8.6 (L) 05/25/2023 0645   HGB 12.2 05/12/2014 0741   HCT 26.4 (L) 05/25/2023 0645   HCT 38.9 05/12/2014 0741   PLT 76 (L) 05/25/2023 0645   PLT 133 (L) 05/12/2014 0741   MCV 99.2 05/25/2023 0645   MCV 102 (H) 05/12/2014 0741   MCH 32.3 05/25/2023 0645   MCHC 32.6 05/25/2023 0645   RDW 16.8 (H) 05/25/2023 0645   RDW 16.0 (H) 05/12/2014 0741   LYMPHSABS 1.1 05/19/2023 1500   LYMPHSABS 2.4 02/15/2014 0837   MONOABS 0.4 05/19/2023 1500   MONOABS 0.5 02/15/2014 0837   EOSABS 0.1 05/19/2023 1500   EOSABS 0.2 02/15/2014 0837   BASOSABS 0.0 05/19/2023 1500   BASOSABS 0.0 02/15/2014 0837    BMET    Component Value Date/Time   NA 133 (L) 05/24/2023 0552   NA 143 07/27/2015 1737    NA 137 05/14/2014 0741   K 4.7 05/24/2023 0552   K 3.3 (L) 05/14/2014 1500   CL 102 05/24/2023 0552   CL 98 05/14/2014 0741   CO2 22 05/24/2023 0552   CO2 35 (H) 05/14/2014 0741   GLUCOSE 100 (H) 05/24/2023 0552   GLUCOSE 96 05/14/2014 0741   BUN 24 (H) 05/24/2023 0552   BUN 25 (H) 05/14/2014 0741   CREATININE 1.26 (H) 05/24/2023 0552   CREATININE 5.83 (H) 05/14/2014 0741   CALCIUM 8.4 (L) 05/24/2023 0552   CALCIUM 8.9 05/14/2014 0741   GFRNONAA 47 (L) 05/24/2023 0552   GFRNONAA 8 (L) 05/14/2014 0741   GFRNONAA 4 (L) 02/15/2014 0837   GFRAA 7 (L) 01/12/2018 1638   GFRAA 10 (L) 05/14/2014 0741   GFRAA 5 (L) 02/15/2014 0837    INR    Component Value Date/Time   INR 1.1 05/19/2023 1630   INR 1.0 10/10/2013 1135     Intake/Output Summary (Last 24 hours) at 05/25/2023 1302 Last data filed at 05/25/2023 0700 Gross per 24 hour  Intake 200 ml  Output 1100 ml  Net -900 ml     Assessment/Plan:  67 y.o. female is s/p right above-the-knee amputation.  4 Days Post-Op   PLAN: Dressing change tomorrow 05/26/2023 to right lower extremity. Continue Plavix 75 mg  daily and Lovenox 30 mg subcu daily. Pain medication as needed. Continue to work with PT OT. OOB to chair for most of the day every day. Disposition plan is rehab this week.  DVT prophylaxis: Lovenox 30 mg daily subcu.   Marcie [facility] 05/25/2023 1:02 PM

## 2023-05-25 NOTE — Progress Notes (Signed)
Progress Note   Patient: Theresa [name] YQM:[id number] DOB: 05-Oct-1955 DOA: 05/19/2023     6 DOS: the patient was seen and examined on 05/25/2023   Brief hospital course: HPI on admission: "Theresa [name] is a 67 y.o. female with medical history significant of  Anemia of chronic disease, COPD coronary artery disease status post stent placement, Diabetes mellitus with diabetic neuropathy and retinopathy, previous history of ESRD on hemodialysis currently status post transplant, hyperlipidemia, Hypertension moderate to severe mitral regurgitation, peripheral vascular disease who is a resident in a group home brought in on account of worsening gangrene noted on the right foot.  ..." See H&P for full HPI on admission & ED course.  Patient was admitted to the hospital, started on IV heparin and Vascular Surgery was consulted, plan for angiogram today (12/11).  Further hospital course and management as outlined below.   Assessment and Plan:  Right foot gangrene, s/p AKA done on 12/12 No signs of infection identified, no antibiotics indicated at this time --Initially on heparin drip --Vascular surgery following, Dr. Wyn Quaker --12/11 -- underwent angiogram with angioplasty to right SFA and popliteal arteries --12/12 -- underwent Right AKA --Initial dressing change by vascular this weekend --Started on Plavix 75 mg daily --Lovenox for DVT ppx started - dc given dropping platelets --OOB to chair --PT/OT evaluations -- return to SNF anticipated --Follow up Vascular's recommendations  Thrombocytopenia -- appears acute on chronic. Worsening since admission, platelets now in 70's --Hold off Lovenox --Also on Plavix - continue for now --Check HIT Ab --Daily CBC's to monitor   Hypokalemia - resolved with replacement --Monitor BMP, replace K PRN  Hyperkalemia - K 5.8 on 12/12 treated and resolved --Monitor BMP   Hypovolemic hyponatremia - mild, Na 132 on admission.   Pt appeared clinically  dehydrated. --Encourage oral hydration --Monitor BMP   Anemia of chronic disease - Hbg stable --Monitor CBC   COPD - stable, not exacerbated --PRN bronchodilators   Coronary artery disease status post stent placement --Continue statin therapy   Diabetes mellitus with diabetic neuropathy and retinopathy --Monitor CBG's --Sliding scale Novolog --Continue Lyrica  Hypoglycemia - CBG noted 44 on evening of 12/13.  Treated an improved to 175 --Hypoglycemia protocol   History of ESRD  Patient used to be on on hemodialysis currently status post transplant No longer on hemodialysis Renal function stable at baseline --On tacrolimus --Nephrology following   Severe hypoalbuminemia --Dietitian consulted   Hyperlipidemia --Lipitor  Hypertension  --On clonidone and Imdur --Increased clonidine 0.1 mg from daily to BID for improved BP control 12/16 started losartan 25 mg p.o. daily and Lopressor 12.5 mg p.o. twice daily --Monitor & adjust regimen as needed   Moderate to severe mitral regurgitation --Monitor volume status  Peripheral vascular disease, now s/p R AKA --Started on Plavix per vascular       Subjective: No significant events overnight, patient was pleasantly confused, could be secondary to intensity of pain.  Patient had pain 10/10, asking for more pain medications. Patient denied any other complaints.     Physical Exam: Vitals:   05/25/23 0416 05/25/23 0821 05/25/23 1214 05/25/23 1514  BP: (!) 174/59 (!) 174/54 (!) 154/47 (!) 168/53  Pulse: 96 81 76 75  Resp: 18 14  16   Temp: 98.7 F (37.1 C) 98.2 F (36.8 C)  98.3 F (36.8 C)  TempSrc: Oral Oral    SpO2: 100% 100% 100% 100%  Weight:      Height:  General exam: awake, alert, no acute distress, chronically ill appearing HEENT: moist mucus membranes, hearing grossly normal  Respiratory system: lungs clear no wheezes or rhonchi, on room air, normal respiratory effort. Cardiovascular system:  normal S1/S2, RRR, no pedal edema.   Gastrointestinal system: soft, NT, ND Central nervous system: A&O x. no gross focal neurologic deficits, normal speech Extremities: right AKA with occlusive dressing in place, no edema, normal tone Psychiatry: normal mood, congruent affect   Data Reviewed:  Notable labs --     Na 133 BUN 24 Cr 1.40 >> 1.47 >> 1.41 >> 1.26 Ca 8.4 Hbg 9.3>8.6 Platelets 103 >> 86 >> 82 >> 79 >> 74>>76k   Family Communication: None present on rounds.    Disposition: Status is: Inpatient Remains inpatient appropriate because: needs SNF placement pending clearance by vascular surgery   Planned Discharge Destination: return to prior home environment    Time spent: 40 minutes  Author: Gillis Santa, MD 05/25/2023 3:32 PM  For on call review [email or web address].

## 2023-05-25 NOTE — Plan of Care (Signed)
  Problem: Education: Goal: Ability to describe self-care measures that may prevent or decrease complications (Diabetes Survival Skills Education) will improve 05/25/2023 2344 by Dahlia Bailiff, RN Outcome: Progressing 05/25/2023 2342 by Dahlia Bailiff, RN Outcome: Progressing Goal: Individualized Educational Video(s) Outcome: Progressing   Problem: Coping: Goal: Ability to adjust to condition or change in health will improve Outcome: Progressing

## 2023-05-25 NOTE — Progress Notes (Signed)
[facility]  ROUNDING NOTE   Subjective:   Patient remains withdrawn  Untouched breakfast tray at bedside  Creatinine 1.26  Objective:  Vital signs in last 24 hours:  Temp:  [98.2 F (36.8 C)-98.7 F (37.1 C)] 98.3 F (36.8 C) (12/16 1514) Pulse Rate:  [75-96] 75 (12/16 1514) Resp:  [14-18] 16 (12/16 1514) BP: (135-174)/(47-70) 168/53 (12/16 1514) SpO2:  [100 %] 100 % (12/16 1514)  Weight change:  Filed Weights   05/19/23 1643 05/20/23 1234 05/21/23 1019  Weight: 66.2 kg 65 kg 65 kg    Intake/Output: I/O last 3 completed shifts: In: 200 [P.O.:200] Out: 1100 [Urine:1100]   Intake/Output this shift:  No intake/output data recorded.  Physical Exam: General: NAD, laying in bed  Head: Normocephalic, atraumatic. Moist oral mucosal membranes  Eyes: Anicteric  Lungs:  Clear to auscultation, normal effort  Heart: Regular rate and rhythm  Abdomen:  Soft, nontender  Extremities: No peripheral edema.  Rt AKA on 05/21/23. Dressing clean, dry and intact  Neurologic: Withdrawn, moving all four extremities  Skin: No lesions  Access: None    Basic Metabolic Panel: Recent Labs  Lab 05/20/23 0051 05/21/23 0512 05/21/23 1123 05/22/23 0703 05/23/23 0459 05/24/23 0552  NA 131* 136 134* 136 133* 133*  K 3.4* 5.8* 5.1 5.1 4.4 4.7  CL 96* 104 101 104 103 102  CO2 25 25  --  21* 22 22  GLUCOSE 135* 84 266* 125* 98 100*  BUN 24* 21 18 20  24* 24*  CREATININE 1.41* 1.34* 1.40* 1.47* 1.41* 1.26*  CALCIUM 8.2* 8.5*  --  8.5* 8.3* 8.4*  MG  --  1.9  --   --   --   --     Liver Function Tests: Recent Labs  Lab 05/19/23 1500 05/20/23 0051  AST 36 27  ALT 14 13  ALKPHOS 154* 127*  BILITOT 0.9 0.6  PROT 6.2* 5.3*  ALBUMIN 1.8* 1.6*   No results for input(s): "LIPASE", "AMYLASE" in the last 168 hours. No results for input(s): "AMMONIA" in the last 168 hours.  CBC: Recent Labs  Lab 05/19/23 1500 05/20/23 0051 05/21/23 1455 05/22/23 0703 05/23/23 0459  05/24/23 0552 05/25/23 0645  WBC 5.6   < > 5.3 7.8 7.2 7.7 5.8  NEUTROABS 4.0  --   --   --   --   --   --   HGB 9.3*   < > 10.6* 9.9* 9.1* 9.3* 8.6*  HCT 28.5*   < > 32.1* 30.2* 27.1* 28.2* 26.4*  MCV 98.3   < > 96.7 96.5 95.4 96.9 99.2  PLT 136*   < > 86* 82* 79* 74* 76*   < > = values in this interval not displayed.    Cardiac Enzymes: No results for input(s): "CKTOTAL", "CKMB", "CKMBINDEX", "TROPONINI" in the last 168 hours.  BNP: Invalid input(s): "POCBNP"  CBG: Recent Labs  Lab 05/24/23 1700 05/24/23 2037 05/25/23 0822 05/25/23 1215 05/25/23 1515  GLUCAP 95 86 107* 156* 65*    Microbiology: Results for orders placed or performed during the hospital encounter of 05/19/23  Surgical pcr screen     Status: None   Collection Time: 05/21/23  3:34 AM   Specimen: Nasal Mucosa; Nasal Swab  Result Value Ref Range Status   MRSA, PCR NEGATIVE NEGATIVE Final   Staphylococcus aureus NEGATIVE NEGATIVE Final    Comment: (NOTE) The Xpert SA Assay (FDA approved for NASAL specimens in patients 25 years of age and older), is  one component of a comprehensive surveillance program. It is not intended to diagnose infection nor to guide or monitor treatment. Performed at [facility], [address] [place], [place] [address]     Coagulation Studies: No results for input(s): "LABPROT", "INR" in the last 72 hours.   Urinalysis: No results for input(s): "COLORURINE", "LABSPEC", "PHURINE", "GLUCOSEU", "HGBUR", "BILIRUBINUR", "KETONESUR", "PROTEINUR", "UROBILINOGEN", "NITRITE", "LEUKOCYTESUR" in the last 72 hours.  Invalid input(s): "APPERANCEUR"    Imaging: No results found.    Medications:     vitamin C  500 mg Oral BID   atorvastatin  20 mg Oral Daily   azaTHIOprine  100 mg Oral Daily   Chlorhexidine Gluconate Cloth  6 each Topical Daily   cloNIDine  0.1 mg Oral BID   clopidogrel  75 mg Oral Daily   vitamin B-12  500 mcg Oral Daily   feeding supplement   1 Container Oral TID BM   folic acid  1 mg Oral Daily   insulin aspart  0-5 Units Subcutaneous QHS   insulin aspart  0-9 Units Subcutaneous TID WC   isosorbide mononitrate  30 mg Oral Daily   leptospermum manuka honey  1 Application Topical Daily   losartan  25 mg Oral Daily   metoprolol tartrate  12.5 mg Oral BID   multivitamin with minerals  1 tablet Oral Daily   pantoprazole  40 mg Oral Daily   pregabalin  75 mg Oral BID   tacrolimus ER  10 mg Oral QAC breakfast   Vitamin D (Ergocalciferol)  50,000 Units Oral Q7 days   zinc sulfate (50mg  elemental zinc)  220 mg Oral Daily   hydrALAZINE, ipratropium-albuterol, morphine injection, mouth rinse, oxyCODONE  Assessment/ Plan:  Ms. FLORIE HEIMSOTH is a 67 y.o.  female with past medical conditions including COPD, anemia, diabetes, CAD, hyperlipidemia, status post renal transplant, hypertension, who was admitted to [facility] on 05/19/2023 for Arterial occlusion [I70.90] Diabetes mellitus with foot ulcer and gangrene (HCC) [X52.841, L97.509, E11.52] Gangrene of right foot (HCC) [I96]   Chronic kidney disease stage IIIb, status post cadaver renal transplant completed at [facility] on 03/30/2019.  Baseline creatinine 1-1.5.   - Immunosuppressive medications: tacrolimus and azothiaprine.  - no indication for tacrolimus level at this time.  - Creatinine remains stable  Lab Results  Component Value Date   CREATININE 1.26 (H) 05/24/2023   CREATININE 1.41 (H) 05/23/2023   CREATININE 1.47 (H) 05/22/2023    Intake/Output Summary (Last 24 hours) at 05/25/2023 1725 Last data filed at 05/25/2023 0700 Gross per 24 hour  Intake 200 ml  Output 400 ml  Net -200 ml     2.  Hypertension with chronic kidney disease.  Home regimen includes furosemide, clonidine, and isosorbide.  Furosemide currently held.  Blood pressure stable  3. Anemia of chronic kidney disease Lab Results  Component Value Date   HGB 8.6 (L) 05/25/2023   Hgb below desired range. Will  monitor   LOS: 6 [place] 12/16/20245:25 PM

## 2023-05-25 NOTE — Progress Notes (Signed)
Patient stated she did not want her pills crushed and she wanted to take them whole. Attempted to offer patient pills one at a time and she refused, stating "just give them all to me!" Patient put pills in her mouth, chewed, and promptly spit them out onto her chest. I scooped them back into the medicine cup and mixed them with applesauce and offered them to her. Patient states "your husband must have sandy hair because your hair is red because you are the devil" and clamped her mouth shut and turned her head when I offered her medication. I reminded her that her BP medication and pain medicine was in the cup and patient stated "I am not taking anything." Medication wasted appropriately as it contained narcotic. Patient states "don't you know when my medicine is due, you are a bad nurse." I informed patient it is 1013 and her morning medication is due at 10. Dr. Lucianne Muss informed.

## 2023-05-25 NOTE — Plan of Care (Signed)

## 2023-05-25 NOTE — Plan of Care (Signed)
  Problem: Education: Goal: Ability to describe self-care measures that may prevent or decrease complications (Diabetes Survival Skills Education) will improve Outcome: Progressing   

## 2023-05-26 DIAGNOSIS — E11621 Type 2 diabetes mellitus with foot ulcer: Secondary | ICD-10-CM | POA: Diagnosis not present

## 2023-05-26 DIAGNOSIS — E1152 Type 2 diabetes mellitus with diabetic peripheral angiopathy with gangrene: Secondary | ICD-10-CM | POA: Diagnosis not present

## 2023-05-26 DIAGNOSIS — L97509 Non-pressure chronic ulcer of other part of unspecified foot with unspecified severity: Secondary | ICD-10-CM | POA: Diagnosis not present

## 2023-05-26 LAB — CBC
HCT: 26.6 % — ABNORMAL LOW (ref 36.0–46.0)
Hemoglobin: 8.6 g/dL — ABNORMAL LOW (ref 12.0–15.0)
MCH: 32.5 pg (ref 26.0–34.0)
MCHC: 32.3 g/dL (ref 30.0–36.0)
MCV: 100.4 fL — ABNORMAL HIGH (ref 80.0–100.0)
Platelets: 88 10*3/uL — ABNORMAL LOW (ref 150–400)
RBC: 2.65 MIL/uL — ABNORMAL LOW (ref 3.87–5.11)
RDW: 16.9 % — ABNORMAL HIGH (ref 11.5–15.5)
WBC: 6.7 10*3/uL (ref 4.0–10.5)
nRBC: 0 % (ref 0.0–0.2)

## 2023-05-26 LAB — PHOSPHORUS: Phosphorus: 2.9 mg/dL (ref 2.5–4.6)

## 2023-05-26 LAB — GLUCOSE, CAPILLARY
Glucose-Capillary: 105 mg/dL — ABNORMAL HIGH (ref 70–99)
Glucose-Capillary: 173 mg/dL — ABNORMAL HIGH (ref 70–99)
Glucose-Capillary: 75 mg/dL (ref 70–99)

## 2023-05-26 LAB — BASIC METABOLIC PANEL
Anion gap: 7 (ref 5–15)
BUN: 22 mg/dL (ref 8–23)
CO2: 21 mmol/L — ABNORMAL LOW (ref 22–32)
Calcium: 8.1 mg/dL — ABNORMAL LOW (ref 8.9–10.3)
Chloride: 105 mmol/L (ref 98–111)
Creatinine, Ser: 1.24 mg/dL — ABNORMAL HIGH (ref 0.44–1.00)
GFR, Estimated: 48 mL/min — ABNORMAL LOW (ref >60)
Glucose, Bld: 355 mg/dL — ABNORMAL HIGH (ref 70–99)
Potassium: 4.6 mmol/L (ref 3.5–5.1)
Sodium: 133 mmol/L — ABNORMAL LOW (ref 135–145)

## 2023-05-26 LAB — MAGNESIUM: Magnesium: 1.9 mg/dL (ref 1.7–2.4)

## 2023-05-26 LAB — HEPARIN INDUCED PLATELET AB (HIT ANTIBODY): Heparin Induced Plt Ab: 0.061 {OD_unit} (ref 0.000–0.400)

## 2023-05-26 MED ORDER — ISOSORBIDE MONONITRATE ER 30 MG PO TB24
30.0000 mg | ORAL_TABLET | Freq: Once | ORAL | Status: AC
  Administered 2023-05-26: 30 mg via ORAL
  Filled 2023-05-26: qty 1

## 2023-05-26 MED ORDER — ISOSORBIDE MONONITRATE ER 60 MG PO TB24
60.0000 mg | ORAL_TABLET | Freq: Every day | ORAL | Status: DC
  Administered 2023-05-27 – 2023-06-07 (×11): 60 mg via ORAL
  Filled 2023-05-26 (×13): qty 1

## 2023-05-26 MED ORDER — METOPROLOL TARTRATE 25 MG PO TABS
25.0000 mg | ORAL_TABLET | Freq: Two times a day (BID) | ORAL | Status: DC
  Administered 2023-05-26: 25 mg via ORAL
  Filled 2023-05-26 (×2): qty 1

## 2023-05-26 NOTE — Progress Notes (Signed)
Progress Note   Patient: Theresa [name] DOB: 1955-11-29 DOA: 05/19/2023     7 DOS: the patient was seen and examined on 05/26/2023   Brief hospital course: HPI on admission: "Theresa [name] is a 67 y.o. female with medical history significant of  Anemia of chronic disease, COPD coronary artery disease status post stent placement, Diabetes mellitus with diabetic neuropathy and retinopathy, previous history of ESRD on hemodialysis currently status post transplant, hyperlipidemia, Hypertension moderate to severe mitral regurgitation, peripheral vascular disease who is a resident in a group home brought in on account of worsening gangrene noted on the right foot.  ..." See H&P for full HPI on admission & ED course.  Patient was admitted to the hospital, started on IV heparin and Vascular Surgery was consulted, plan for angiogram today (12/11).  Further hospital course and management as outlined below.   Assessment and Plan:  Right foot gangrene, s/p AKA done on 12/12 No signs of infection identified, no antibiotics indicated at this time --s/p IV heparin drip --Vascular surgery following, Dr. Wyn Quaker --12/11 -- underwent angiogram with angioplasty to right SFA and popliteal arteries --12/12 -- underwent Right AKA --Initial dressing change by vascular this weekend --Started on Plavix 75 mg daily --Lovenox for DVT ppx started - dc given dropping platelets --OOB to chair --PT/OT evaluations -- return to SNF anticipated 12/17 dressing was changed by vascular surgery, no complications --Follow up Vascular's recommendations  Thrombocytopenia -- appears acute on chronic. Worsening since admission, platelets now in 70's --Hold off Lovenox --Also on Plavix - continue for now --Check HIT Ab --Daily CBC's to monitor Plts 88 stable  Hypokalemia - resolved with replacement --Monitor BMP, replace K PRN  Hyperkalemia - K 5.8 on 12/12 treated and resolved --Monitor BMP    Hypovolemic hyponatremia - mild, Na 132 on admission.   Pt appeared clinically dehydrated. --Encourage oral hydration --Monitor BMP   Anemia of chronic disease - Hbg stable Folic acid level 6.5, at lower end and B12 level 387, goal >400. Started folic acid 1 mg p.o. daily and B12 500 mcg p.o. daily --Monitor CBC   COPD - stable, not exacerbated --PRN bronchodilators   Coronary artery disease status post stent placement --Continue statin therapy   Diabetes mellitus with diabetic neuropathy and retinopathy --Monitor CBG's --Sliding scale Novolog --Continue Lyrica  Hypoglycemia - CBG noted 44 on evening of 12/13.  Treated an improved to 175 --Hypoglycemia protocol   History of ESRD  Patient used to be on on hemodialysis currently status post transplant No longer on hemodialysis Renal function stable at baseline --On tacrolimus and azathioprine --Nephrology following   Severe hypoalbuminemia: Continue protein supplement Dietitian consulted   Hyperlipidemia: Continue Lipitor  Hypertension: --On clonidone and Imdur --Increased clonidine 0.1 mg from daily to BID for improved BP control 12/16 started losartan 25 mg p.o. daily and Lopressor 12.5 mg p.o. twice daily 12/17 increased Imdur 30 to 60 mg p.o. daily, increased Lopressor 25 mg p.o. twice daily --Monitor & adjust regimen as needed  Vitamin D insufficiency: started vitamin D 50,000 units p.o. weekly, follow with PCP to repeat vitamin D level after 3 to 6 months.  Moderate to severe mitral regurgitation --Monitor volume status  Peripheral vascular disease, now s/p R AKA --Started on Plavix per vascular   Body mass index is 22.44 kg/m.  DVT prophylaxis: SCD due to thrombocytopenia     Nutrition Documentation    Flowsheet Row ED to Hosp-Admission (Current) from 05/19/2023 in [facility]  MED PCU  Nutrition Problem Increased nutrient needs  Etiology wound healing  Nutrition Goal Patient will meet  greater than or equal to 90% of their needs  Interventions MVI, Liberalize Diet, Boost Breeze, Prostat     ,  Active Pressure Injury/Wound(s)     Pressure Ulcer  Duration          Pressure Injury 05/20/23 Sacrum Mid Stage 3 -  Full thickness tissue loss. Subcutaneous fat may be visible but bone, tendon or muscle are NOT exposed. 2.8 cm x 4 cm  x 0.5 cm depth (under piece of stringy slough) 6 days   Pressure Injury 05/21/23 Buttocks Left;Lateral Unstageable - Full thickness tissue loss in which the base of the injury is covered by slough (yellow, tan, gray, green or brown) and/or eschar (tan, brown or black) in the wound bed. 1 cm x 1 cm 80% yellow  5 days   Pressure Injury 05/24/23 Heel Left Unstageable - Full thickness tissue loss in which the base of the injury is covered by slough (yellow, tan, gray, green or brown) and/or eschar (tan, brown or black) in the wound bed. mushy texture to the touch, white/b 1 day              Subjective: No significant events overnight, today patient was resting comfortably, denied pain in the leg.   Physical Exam: Vitals:   05/26/23 0320 05/26/23 0821 05/26/23 1229 05/26/23 1542  BP: (!) 174/52 (!) 179/61 (!) 161/34 (!) 156/71  Pulse: 76 81 70 70  Resp: 18 16 16 16   Temp: 97.7 F (36.5 C) 98.1 F (36.7 C)  98.3 F (36.8 C)  TempSrc: Oral     SpO2: 100% 100% 100% 100%  Weight:      Height:       General exam: awake, alert, no acute distress, chronically ill appearing HEENT: moist mucus membranes, hearing grossly normal  Respiratory system: lungs clear no wheezes or rhonchi, on room air, normal respiratory effort. Cardiovascular system: normal S1/S2, RRR, no pedal edema.   Gastrointestinal system: soft, NT, ND Central nervous system: A&O x. no gross focal neurologic deficits, normal speech Extremities: right AKA with occlusive dressing in place, no edema, normal tone Psychiatry: normal mood, congruent affect   Data Reviewed:  Notable  labs --     Na 133 BUN 24>>22 Cr 1.40 >> 1.47 >> 1.41 >> 1.26>>1.24 Ca 8.1 Hbg 9.3>8.6 Platelets 103 >> 86 >> 82 >> 79 >> 74>>76>>88k   Family Communication: None present on rounds.    Disposition: Status is: Inpatient Remains inpatient appropriate because: needs SNF placement pending clearance by vascular surgery  Planned Discharge Destination: return to prior home environment   Time spent: 40 minutes  Author: Gillis Santa, MD 05/26/2023 4:44 PM  For on call review [email or web address].

## 2023-05-26 NOTE — Progress Notes (Signed)
Physical Therapy Treatment Patient Details Name: DANUTA ESFAHANI MRN: [id number] DOB: 09-Jan-1956 Today's Date: 05/26/2023   History of Present Illness Pt is s/p R AKA 2/2 R foot and lower leg gangrene. PMH of CAD s/p stent, HTN, COPD, MI, CHF, dementia, DMII, previous ESRD now s/p transplant, HLD, PVD.    PT Comments  Pt very limited with any active participation during the session secondary to cognition.  Pt did state that she was interested in brushing her teeth but required +3 total assist to come up to sitting at the EOB.  Once in sitting pt initially attempted to return to supine but with max encouragement and with toothbrush provided pt agreed to remain in sitting and was able to brush her teeth while maintaining her static sitting balance independently without UE support.  Pt will continue to benefit from a trial of skilled PT services with the hope that her participation will show improvement over time.      If plan is discharge home, recommend the following: Two people to help with bathing/dressing/bathroom;Two people to help with walking and/or transfers;Direct supervision/assist for medications management;Help with stairs or ramp for entrance;Assist for transportation;Assistance with cooking/housework;Assistance with feeding;Direct supervision/assist for financial management;Supervision due to cognitive status   Can travel by private vehicle     No  Equipment Recommendations  Other (comment) (TBD)    Recommendations for Other Services       Precautions / Restrictions Precautions Precautions: Fall Restrictions Weight Bearing Restrictions Per Provider Order: Yes RLE Weight Bearing Per Provider Order: Non weight bearing Other Position/Activity Restrictions: R AKA     Mobility  Bed Mobility Overal bed mobility: Needs Assistance Bed Mobility: Supine to Sit, Sit to Supine, Rolling Rolling: Total assist   Supine to sit: Total assist, +2 for physical assistance Sit to supine:  Total assist, +2 for physical assistance   General bed mobility comments: No active assistance from patient with occasional physical resistance to moving while trying to assit with rolling for hygiene    Transfers                   General transfer comment: unsafe to attempt    Ambulation/Gait                   Stairs             Wheelchair Mobility     Tilt Bed    Modified Rankin (Stroke Patients Only)       Balance Overall balance assessment: Needs assistance   Sitting balance-Leahy Scale: Fair Sitting balance - Comments: Pt able to maintain static sitting without physical assist when motivated to do                                    Cognition Arousal: Alert Behavior During Therapy: Agitated, Flat affect Overall Cognitive Status: History of cognitive impairments - at baseline                                 General Comments: Frequently states "wait, just give me a moment" when attempting any kind of mobility but never followed up with any initiative; not oriented to place, time, or situation; unable to respond to logic.        Exercises Total Joint Exercises Long Arc Quad: AAROM, AROM, Strengthening, Left, 5 reps Knee Flexion:  AROM, AAROM, Strengthening, Left, 5 reps Other Exercises Other Exercises: Static sitting at EOB for core therex and improved activity tolerance    General Comments        Pertinent Vitals/Pain Pain Assessment Breathing: normal Negative Vocalization: occasional moan/groan, low speech, negative/disapproving quality Facial Expression: facial grimacing Body Language: rigid, fists clenched, knees up, pushing/pulling away, strikes out Consolability: distracted or reassured by voice/touch PAINAD Score: 6 Pain Location: R AKA site Pain Descriptors / Indicators: Grimacing, Moaning, Guarding Pain Intervention(s): Premedicated before session, Repositioned, Monitored during session     Home Living Family/patient expects to be discharged to:: Skilled nursing facility                        Prior Function            PT Goals (current goals can now be found in the care plan section) Progress towards PT goals: Progressing toward goals    Frequency    Min 1X/week      PT Plan      Co-evaluation PT/OT/SLP Co-Evaluation/Treatment: Yes Reason for Co-Treatment: Necessary to address cognition/behavior during functional activity;For patient/therapist safety;To address functional/ADL transfers PT goals addressed during session: Mobility/safety with mobility;Balance;Strengthening/ROM OT goals addressed during session: ADL's and self-care      AM-PAC PT "6 Clicks" Mobility   Outcome Measure  Help needed turning from your back to your side while in a flat bed without using bedrails?: Total Help needed moving from lying on your back to sitting on the side of a flat bed without using bedrails?: Total Help needed moving to and from a bed to a chair (including a wheelchair)?: Total Help needed standing up from a chair using your arms (e.g., wheelchair or bedside chair)?: Total Help needed to walk in hospital room?: Total Help needed climbing 3-5 steps with a railing? : Total 6 Click Score: 6    End of Session   Activity Tolerance: Patient limited by pain;Treatment limited secondary to agitation Patient left: in bed;with call bell/phone within reach;with bed alarm set Nurse Communication: Mobility status;Other (comment) (Possible need for additional hygiene care due to patient actively resisting attemps to assist) PT Visit Diagnosis: Other abnormalities of gait and mobility (R26.89);Difficulty in walking, not elsewhere classified (R26.2);Muscle weakness (generalized) (M62.81);Pain Pain - Right/Left: Right Pain - part of body: Leg     Time: 4742-5956 PT Time Calculation (min) (ACUTE ONLY): 31 min  Charges:    $Therapeutic Activity: 8-22 mins PT  General Charges $$ ACUTE PT VISIT: 1 Visit                     D. Elly Modena PT, DPT 05/26/23, 12:38 PM

## 2023-05-26 NOTE — Progress Notes (Signed)
Progress Note    05/26/2023 3:11 PM 5 Days Post-Op  Subjective:  Theresa [name] is a 67 year old female now postop day #5 from a right above-the-knee amputation.  Patient is resting comfortably in bed this morning.  Dressing remains Clean dry and intact.  Her pain control seems to be adequate at this point in time although the she does endorse having some incisional pain.  No major events overnight vitals are remained stable.    Vitals:   05/26/23 0821 05/26/23 1229  BP: (!) 179/61 (!) 161/34  Pulse: 81 70  Resp: 16 16  Temp: 98.1 F (36.7 C)   SpO2: 100% 100%   Physical Exam: Cardiac:  normal S1/S2, RRR, no JVD, murmurs, rubs, gallops, no pedal edema  Lungs:  CTAB, no wheezes, rales or rhonchi, normal respiratory effort.  Incisions:  Right Above the knee amputation with dressing. Clean dry and intact.  Extremities:  Left lower extremity warm to touch with doppler pulses. Right Lower extremity AKA. Dressing clean dry and intact. No drainage to note.  Abdomen:  Positive bowel sounds throughout, soft, non tender and non distended. Neurologic: AAOX3 answers all questions and follows commands appropriately.  CBC    Component Value Date/Time   WBC 6.7 05/26/2023 1126   RBC 2.65 (L) 05/26/2023 1126   HGB 8.6 (L) 05/26/2023 1126   HGB 12.2 05/12/2014 0741   HCT 26.6 (L) 05/26/2023 1126   HCT 38.9 05/12/2014 0741   PLT 88 (L) 05/26/2023 1126   PLT 133 (L) 05/12/2014 0741   MCV 100.4 (H) 05/26/2023 1126   MCV 102 (H) 05/12/2014 0741   MCH 32.5 05/26/2023 1126   MCHC 32.3 05/26/2023 1126   RDW 16.9 (H) 05/26/2023 1126   RDW 16.0 (H) 05/12/2014 0741   LYMPHSABS 1.1 05/19/2023 1500   LYMPHSABS 2.4 02/15/2014 0837   MONOABS 0.4 05/19/2023 1500   MONOABS 0.5 02/15/2014 0837   EOSABS 0.1 05/19/2023 1500   EOSABS 0.2 02/15/2014 0837   BASOSABS 0.0 05/19/2023 1500   BASOSABS 0.0 02/15/2014 0837    BMET    Component Value Date/Time   NA 133 (L) 05/26/2023 1126   NA 143  07/27/2015 1737   NA 137 05/14/2014 0741   K 4.6 05/26/2023 1126   K 3.3 (L) 05/14/2014 1500   CL 105 05/26/2023 1126   CL 98 05/14/2014 0741   CO2 21 (L) 05/26/2023 1126   CO2 35 (H) 05/14/2014 0741   GLUCOSE 355 (H) 05/26/2023 1126   GLUCOSE 96 05/14/2014 0741   BUN 22 05/26/2023 1126   BUN 25 (H) 05/14/2014 0741   CREATININE 1.24 (H) 05/26/2023 1126   CREATININE 5.83 (H) 05/14/2014 0741   CALCIUM 8.1 (L) 05/26/2023 1126   CALCIUM 8.9 05/14/2014 0741   GFRNONAA 48 (L) 05/26/2023 1126   GFRNONAA 8 (L) 05/14/2014 0741   GFRNONAA 4 (L) 02/15/2014 0837   GFRAA 7 (L) 01/12/2018 1638   GFRAA 10 (L) 05/14/2014 0741   GFRAA 5 (L) 02/15/2014 0837    INR    Component Value Date/Time   INR 1.1 05/19/2023 1630   INR 1.0 10/10/2013 1135     Intake/Output Summary (Last 24 hours) at 05/26/2023 1511 Last data filed at 05/26/2023 1032 Gross per 24 hour  Intake 0 ml  Output 1000 ml  Net -1000 ml     Assessment/Plan:  67 y.o. female is s/p right above-the-knee amputation.  5 Days Post-Op   PLAN: Dressing changed today at the bedside. No complications, hematoma,  seroma or infection to note. . Continue Plavix 75 mg daily and Lovenox 30 mg subcu daily. Pain medication as needed. Continue to work with PT OT. OOB to chair for most of the day every day. Disposition plan is rehab this week.  DVT prophylaxis:  Lovenox 30 mg Sq QD   Mulan Adan R Mariadelaluz [facility] 05/26/2023 3:11 PM

## 2023-05-26 NOTE — Progress Notes (Signed)
Occupational Therapy Treatment Patient Details Name: Theresa [name] MRN: [id number] DOB: 04-16-1956 Today's Date: 05/26/2023   History of present illness Pt is s/p R AKA 2/2 R foot and lower leg gangrene. PMH of CAD s/p stent, HTN, COPD, MI, CHF, dementia, DMII, previous ESRD now s/p transplant, HLD, PVD.   OT comments  Pt received sidelying on R side in bed. Appearing alert, having just finished with phlebotomist; willing to work with OT on grooming, although limited by poor cognition (self-limiting, resistant to activity/bed mobility). T/f total assist x3 to EOB. Maintained EOB with MIN-CGA for approx 8 minutes. See flowsheet below for further details of session. Left supine with all needs in reach.  Patient will benefit from continued OT while in acute care.       If plan is discharge home, recommend the following:  Two people to help with walking and/or transfers;Two people to help with bathing/dressing/bathroom;Direct supervision/assist for medications management;Supervision due to cognitive status;Direct supervision/assist for financial management;Assist for transportation;Help with stairs or ramp for entrance;Assistance with feeding;Assistance with cooking/housework   Equipment Recommendations  BSC/3in1;Wheelchair (measurements OT);Wheelchair cushion (measurements OT)    Recommendations for Other Services      Precautions / Restrictions Precautions Precautions: Fall Restrictions Weight Bearing Restrictions Per Provider Order: Yes RLE Weight Bearing Per Provider Order: Non weight bearing Other Position/Activity Restrictions: R AKA       Mobility Bed Mobility Overal bed mobility: Needs Assistance Bed Mobility: Supine to Sit, Sit to Supine Rolling: Total assist, +2 for physical assistance   Supine to sit: Total assist, +2 for physical assistance (+3) Sit to supine: Total assist (+3)   General bed mobility comments: Pt appearing to resist supine to sit and rolling; not able  to respond to logic that she needs to get clean and it is good for her to sit up EOB.    Transfers                   General transfer comment: unsafe to attempt     Balance Overall balance assessment: Needs assistance   Sitting balance-Leahy Scale: Fair Sitting balance - Comments: Once obtained balance on EOB, pt able to maintain with CGA as long as volitional (then started to lean R to return to supine).                                   ADL either performed or assessed with clinical judgement   ADL Overall ADL's : Needs assistance/impaired     Grooming: Wash/dry hands;Wash/dry face;Contact guard assist;Sitting;Set up;Supervision/safety Grooming Details (indicate cue type and reason): sitting up at EOB, pt requiring intermittent CGA for sitting balance during bimanual activity; OT set up toothpaste on toothbrush.                     Toileting- Clothing Manipulation and Hygiene: Total assistance;+2 for physical assistance Toileting - Clothing Manipulation Details (indicate cue type and reason): bed level; dependent for incontinent bowel hygiene and +2 for rolling (pt resistant) to change pads.     Functional mobility during ADLs:  (unable to mobilize) General ADL Comments: Once pt gained balance in sitting (MOD A initially), was able to use BIL hands for UB ADL; close CGA/SBA during sitting EOB. Pt limited by pain and poor cognition.    Extremity/Trunk Assessment Upper Extremity Assessment Upper Extremity Assessment: Generalized weakness   Lower Extremity Assessment Lower Extremity Assessment: Defer  to PT evaluation;RLE deficits/detail RLE Deficits / Details: AKA        Vision       Perception     Praxis      Cognition Arousal: Alert Behavior During Therapy: Agitated, Flat affect Overall Cognitive Status: History of cognitive impairments - at baseline                                 General Comments: Alert, verbal,  angry at therapists, continues to ask "wait, just give me a moment"; not oriented to place, time, or situation; unable to respond to logic.        Exercises      Shoulder Instructions       General Comments On room air. Dressing on AKA remained intact throughout session.    Pertinent Vitals/ Pain       Pain Assessment Pain Assessment: PAINAD Breathing: normal Negative Vocalization: occasional moan/groan, low speech, negative/disapproving quality Facial Expression: facial grimacing Body Language: rigid, fists clenched, knees up, pushing/pulling away, strikes out Consolability: distracted or reassured by voice/touch PAINAD Score: 6 Pain Location: R AKA site Pain Descriptors / Indicators: Grimacing, Moaning, Guarding Pain Intervention(s): Limited activity within patient's tolerance, Monitored during session, Premedicated before session  Home Living                                          Prior Functioning/Environment              Frequency  Min 1X/week        Progress Toward Goals  OT Goals(current goals can now be found in the care plan section)  Progress towards OT goals: Progressing toward goals  Acute Rehab OT Goals Patient Stated Goal: Does not state OT Goal Formulation: Patient unable to participate in goal setting Time For Goal Achievement: 06/05/23 Potential to Achieve Goals: Fair ADL Goals Pt Will Perform Grooming: sitting;with contact guard assist Pt Will Perform Lower Body Dressing: with max assist;sitting/lateral leans Pt Will Transfer to Toilet: with max assist;squat pivot transfer;bedside commode Pt Will Perform Toileting - Clothing Manipulation and hygiene: with min assist;sitting/lateral leans  Plan      Co-evaluation    PT/OT/SLP Co-Evaluation/Treatment: Yes Reason for Co-Treatment: Necessary to address cognition/behavior during functional activity;For patient/therapist safety;To address functional/ADL transfers PT goals  addressed during session: Mobility/safety with mobility;Balance;Strengthening/ROM OT goals addressed during session: ADL's and self-care      AM-PAC OT "6 Clicks" Daily Activity     Outcome Measure   Help from another person eating meals?: None Help from another person taking care of personal grooming?: A Little Help from another person toileting, which includes using toliet, bedpan, or urinal?: Total Help from another person bathing (including washing, rinsing, drying)?: A Lot Help from another person to put on and taking off regular upper body clothing?: A Lot Help from another person to put on and taking off regular lower body clothing?: A Lot 6 Click Score: 14    End of Session    OT Visit Diagnosis: Other abnormalities of gait and mobility (R26.89);Muscle weakness (generalized) (M62.81);Pain;Other symptoms and signs involving cognitive function Pain - Right/Left: Right Pain - part of body: Leg   Activity Tolerance Other (comment);Patient limited by pain (limited by cognitive deficits)   Patient Left in bed;with call bell/phone within reach;with bed alarm set  Nurse Communication Mobility status        Time: 2956-2130 OT Time Calculation (min): 32 min  Charges: OT General Charges $OT Visit: 1 Visit OT Treatments $Self Care/Home Management : 8-22 mins  Linward Foster, MS, OTR/L  Alvester Morin 05/26/2023, 1:13 PM

## 2023-05-26 NOTE — Inpatient Diabetes Management (Signed)
Inpatient Diabetes Program Recommendations  AACE/ADA: New Consensus Statement on Inpatient Glycemic Control  Target Ranges:  Prepandial:   less than 140 mg/dL      Peak postprandial:   less than 180 mg/dL (1-2 hours)      Critically ill patients:  140 - 180 mg/dL    Latest Reference Range & Units 05/25/23 08:22 05/25/23 12:15 05/25/23 15:15 05/25/23 17:59 05/25/23 21:00  Glucose-Capillary 70 - 99 mg/dL 086 (H) 578 (H)  Novolog 2 units 65 (L) 112 (H) 100 (H)    Review of Glycemic Control  Diabetes history: DM2 Outpatient Diabetes medications: None Current orders for Inpatient glycemic control: Novolog 0-9 units TID with meals, Novolog 0-5 units QHS  Inpatient Diabetes Program Recommendations:    Insulin: CBG down to 65 mg/dl at 46:96 on 29/52/84 after getting Novolog correction at noon. Please decrease Novolog correction to 0-6 units TID with meals.  Thanks, Orlando Penner, RN, MSN, CDCES Diabetes Coordinator Inpatient Diabetes Program [phone] (Team Pager from 8am to 5pm)

## 2023-05-26 NOTE — TOC Initial Note (Signed)
Transition of Care [facility]) - Initial/Assessment Note    Patient Details  Name: Theresa [name] MRN: [id number] Date of Birth: Apr 20, 1956  Transition of Care [facility]) CM/SW Contact:    Truddie Hidden, RN Phone Number: 05/26/2023, 3:37 PM  Clinical Narrative:                 Attempt to reach patient's son to discuss discharge plan for SNF. No answer. Unable to reach patient's son. Unable to leave a message.   4:00pm Retrieved call from patient's son.Discussed discharge plan. Patient was previously living with him. He was advied of discharge rec for SNF and is agreeable. He does not have a choice of facility but would like her to stay local. He was advised once bed offers are obtain he would still be given choice.           Patient Goals and CMS Choice            Expected Discharge Plan and Services                                              Prior Living Arrangements/Services                       Activities of Daily Living   ADL Screening (condition at time of admission) Independently performs ADLs?: No (unknown - pt poor historian) Does the patient have a NEW difficulty with bathing/dressing/toileting/self-feeding that is expected to last >3 days?: Yes (Initiates electronic notice to provider for possible OT consult) Does the patient have a NEW difficulty with getting in/out of bed, walking, or climbing stairs that is expected to last >3 days?: Yes (Initiates electronic notice to provider for possible PT consult) Does the patient have a NEW difficulty with communication that is expected to last >3 days?: Yes (Initiates electronic notice to provider for possible SLP consult) Is the patient deaf or have difficulty hearing?: No Does the patient have difficulty seeing, even when wearing glasses/contacts?: No Does the patient have difficulty concentrating, remembering, or making decisions?: Yes  Permission Sought/Granted                  Emotional  Assessment              Admission diagnosis:  Arterial occlusion [I70.90] Diabetes mellitus with foot ulcer and gangrene (HCC) [Q65.784, L97.509, E11.52] Gangrene of right foot (HCC) [I96] Patient Active Problem List   Diagnosis Date Noted   Gangrene of right foot (HCC) 05/20/2023   Arterial occlusion 05/20/2023   Diabetes mellitus with foot ulcer and gangrene (HCC) 05/19/2023   Annual physical exam 01/12/2023   Chronic obstructive pulmonary disease (HCC) 01/12/2023   Need for vaccination 01/12/2023   Postmenopausal estrogen deficiency 01/12/2023   Mass of right breast 07/03/2021   Hair loss 05/22/2021   Lung nodules 12/04/2020   Lumbar radiculopathy 02/27/2020   Aortic atherosclerosis (HCC) 02/24/2020   Vitamin D deficiency 06/20/2019   Kidney transplanted 03/30/2019   Dementia (HCC) 02/28/2019   Allergic rhinitis 09/23/2018   Valvular heart disease 01/05/2018   Thyroid nodule 08/11/2017   CHF (congestive heart failure) (HCC) 07/26/2017   PAD (peripheral artery disease) (HCC) 03/07/2017   Bilateral carotid artery stenosis 02/17/2017   OSA on CPAP 12/20/2015   Osteoarthritis of right hip 08/23/2013   DDD (degenerative disc disease), lumbosacral  08/23/2013   Diabetic retinopathy (HCC) 05/28/2013   Hyperlipidemia    End stage renal disease (HCC) 11/08/2012   Essential hypertension 11/08/2012   Diabetic neuropathy (HCC) 02/18/2012   Anemia in chronic kidney disease (CKD) 02/18/2012   Diabetes mellitus, type II (HCC) 06/30/2011   Heart murmur 06/13/2011   PCP:  Dana Allan, MD Pharmacy:   [facility] [address] [place] - [address] [place] [address] Phone: [phone] Fax: [phone]     Social Drivers of Health (SDOH) Social History: SDOH Screenings   Food Insecurity: Patient Unable To Answer (05/20/2023)  Housing: High Risk (05/20/2023)  Transportation Needs: Patient Unable To Answer (05/20/2023)  Utilities: Patient  Unable To Answer (05/20/2023)  Alcohol Screen: Low Risk  (12/17/2022)  Depression (PHQ2-9): Medium Risk (12/31/2022)  Financial Resource Strain: Low Risk  (02/13/2023)   Received from [facility]  Physical Activity: Inactive (12/17/2022)  Social Connections: Socially Isolated (12/17/2022)  Stress: No Stress Concern Present (12/17/2022)  Tobacco Use: Medium Risk (05/21/2023)  Health Literacy: Low Risk  (09/13/2020)   Received from [facility]   SDOH Interventions:     Readmission Risk Interventions     No data to display

## 2023-05-26 NOTE — Progress Notes (Signed)
Pt refusing 1200 BG check. This RN as well as Verlon Au, CNA attempted to obtain BG. Pt continued to adamantly refuse. Dr. Lucianne Muss made aware.

## 2023-05-27 DIAGNOSIS — L97509 Non-pressure chronic ulcer of other part of unspecified foot with unspecified severity: Secondary | ICD-10-CM | POA: Diagnosis not present

## 2023-05-27 DIAGNOSIS — E1152 Type 2 diabetes mellitus with diabetic peripheral angiopathy with gangrene: Secondary | ICD-10-CM | POA: Diagnosis not present

## 2023-05-27 DIAGNOSIS — I96 Gangrene, not elsewhere classified: Secondary | ICD-10-CM

## 2023-05-27 DIAGNOSIS — E11621 Type 2 diabetes mellitus with foot ulcer: Secondary | ICD-10-CM | POA: Diagnosis not present

## 2023-05-27 LAB — BASIC METABOLIC PANEL
Anion gap: 7 (ref 5–15)
BUN: 21 mg/dL (ref 8–23)
CO2: 23 mmol/L (ref 22–32)
Calcium: 8.5 mg/dL — ABNORMAL LOW (ref 8.9–10.3)
Chloride: 106 mmol/L (ref 98–111)
Creatinine, Ser: 1.18 mg/dL — ABNORMAL HIGH (ref 0.44–1.00)
GFR, Estimated: 51 mL/min — ABNORMAL LOW (ref >60)
Glucose, Bld: 96 mg/dL (ref 70–99)
Potassium: 4.9 mmol/L (ref 3.5–5.1)
Sodium: 136 mmol/L (ref 135–145)

## 2023-05-27 LAB — GLUCOSE, CAPILLARY
Glucose-Capillary: 146 mg/dL — ABNORMAL HIGH (ref 70–99)
Glucose-Capillary: 133 mg/dL — ABNORMAL HIGH (ref 70–99)

## 2023-05-27 LAB — CBC
HCT: 26.7 % — ABNORMAL LOW (ref 36.0–46.0)
Hemoglobin: 8.4 g/dL — ABNORMAL LOW (ref 12.0–15.0)
MCH: 32.2 pg (ref 26.0–34.0)
MCHC: 31.5 g/dL (ref 30.0–36.0)
MCV: 102.3 fL — ABNORMAL HIGH (ref 80.0–100.0)
Platelets: 94 10*3/uL — ABNORMAL LOW (ref 150–400)
RBC: 2.61 MIL/uL — ABNORMAL LOW (ref 3.87–5.11)
RDW: 16.6 % — ABNORMAL HIGH (ref 11.5–15.5)
WBC: 6.1 10*3/uL (ref 4.0–10.5)
nRBC: 0 % (ref 0.0–0.2)

## 2023-05-27 LAB — MAGNESIUM: Magnesium: 2.1 mg/dL (ref 1.7–2.4)

## 2023-05-27 LAB — PHOSPHORUS: Phosphorus: 2.7 mg/dL (ref 2.5–4.6)

## 2023-05-27 MED ORDER — QUETIAPINE FUMARATE 25 MG PO TABS
25.0000 mg | ORAL_TABLET | Freq: Two times a day (BID) | ORAL | Status: DC
  Administered 2023-05-27 – 2023-05-28 (×3): 25 mg via ORAL
  Filled 2023-05-27 (×3): qty 1

## 2023-05-27 MED ORDER — METOPROLOL TARTRATE 50 MG PO TABS
50.0000 mg | ORAL_TABLET | Freq: Two times a day (BID) | ORAL | Status: DC
  Administered 2023-05-27 – 2023-06-07 (×18): 50 mg via ORAL
  Filled 2023-05-27 (×22): qty 1

## 2023-05-27 MED ORDER — HALOPERIDOL LACTATE 5 MG/ML IJ SOLN: 2.0000 mg | Freq: Four times a day (QID) | INTRAMUSCULAR | Status: DC | PRN

## 2023-05-27 NOTE — Progress Notes (Signed)
Progress Note    05/27/2023 11:13 AM 6 Days Post-Op  Subjective:   Theresa [name] is a 67 year old female now postop day #6 from a right above-the-knee amputation.  Patient is resting comfortably in bed this morning.  Dressing remains Clean dry and intact.  Her pain control seems to be adequate at this point in time although the she does endorse having some incisional pain.  No major events overnight vitals are remained stable.    Vitals:   05/26/23 2257 05/27/23 0754  BP: (!) 161/51 (!) 167/41  Pulse: 82 76  Resp: 17 16  Temp: 98.7 F (37.1 C) 98 F (36.7 C)  SpO2: 99% 100%   Physical Exam: Cardiac:  normal S1/S2, RRR, no JVD, murmurs, rubs, gallops, no pedal edema  Lungs:  CTAB, no wheezes, rales or rhonchi, normal respiratory effort.  Incisions:  Right Above the knee amputation with dressing. Clean dry and intact.  Extremities:  Left lower extremity warm to touch with doppler pulses. Right Lower extremity AKA. Dressing clean dry and intact. No drainage to note.  Abdomen:  Positive bowel sounds throughout, soft, non tender and non distended. Neurologic: AAOX3 answers all questions and follows commands appropriately.  CBC    Component Value Date/Time   WBC 6.1 05/27/2023 0538   RBC 2.61 (L) 05/27/2023 0538   HGB 8.4 (L) 05/27/2023 0538   HGB 12.2 05/12/2014 0741   HCT 26.7 (L) 05/27/2023 0538   HCT 38.9 05/12/2014 0741   PLT 94 (L) 05/27/2023 0538   PLT 133 (L) 05/12/2014 0741   MCV 102.3 (H) 05/27/2023 0538   MCV 102 (H) 05/12/2014 0741   MCH 32.2 05/27/2023 0538   MCHC 31.5 05/27/2023 0538   RDW 16.6 (H) 05/27/2023 0538   RDW 16.0 (H) 05/12/2014 0741   LYMPHSABS 1.1 05/19/2023 1500   LYMPHSABS 2.4 02/15/2014 0837   MONOABS 0.4 05/19/2023 1500   MONOABS 0.5 02/15/2014 0837   EOSABS 0.1 05/19/2023 1500   EOSABS 0.2 02/15/2014 0837   BASOSABS 0.0 05/19/2023 1500   BASOSABS 0.0 02/15/2014 0837    BMET    Component Value Date/Time   NA 136 05/27/2023 0538   NA  143 07/27/2015 1737   NA 137 05/14/2014 0741   K 4.9 05/27/2023 0538   K 3.3 (L) 05/14/2014 1500   CL 106 05/27/2023 0538   CL 98 05/14/2014 0741   CO2 23 05/27/2023 0538   CO2 35 (H) 05/14/2014 0741   GLUCOSE 96 05/27/2023 0538   GLUCOSE 96 05/14/2014 0741   BUN 21 05/27/2023 0538   BUN 25 (H) 05/14/2014 0741   CREATININE 1.18 (H) 05/27/2023 0538   CREATININE 5.83 (H) 05/14/2014 0741   CALCIUM 8.5 (L) 05/27/2023 0538   CALCIUM 8.9 05/14/2014 0741   GFRNONAA 51 (L) 05/27/2023 0538   GFRNONAA 8 (L) 05/14/2014 0741   GFRNONAA 4 (L) 02/15/2014 0837   GFRAA 7 (L) 01/12/2018 1638   GFRAA 10 (L) 05/14/2014 0741   GFRAA 5 (L) 02/15/2014 0837    INR    Component Value Date/Time   INR 1.1 05/19/2023 1630   INR 1.0 10/10/2013 1135     Intake/Output Summary (Last 24 hours) at 05/27/2023 1113 Last data filed at 05/27/2023 0600 Gross per 24 hour  Intake --  Output 550 ml  Net -550 ml     Assessment/Plan:  67 y.o. female is s/p right above-the-knee amputation.  6 Days Post-Op   PLAN: Dressing changed today at the bedside by nursing. No  complications, hematoma, seroma or infection to note. . Continue Plavix 75 mg daily and Lovenox 30 mg subcu daily. Pain medication as needed. Continue to work with PT OT. OOB to chair for most of the day every day. Disposition plan is rehab this week.   DVT prophylaxis:  Lovenox 30 mg Sq QD    Caitlin Ainley [facility] 05/27/2023 11:13 AM

## 2023-05-27 NOTE — Inpatient Diabetes Management (Signed)
Inpatient Diabetes Program Recommendations  AACE/ADA: New Consensus Statement on Inpatient Glycemic Control   Target Ranges:  Prepandial:   less than 140 mg/dL      Peak postprandial:   less than 180 mg/dL (1-2 hours)      Critically ill patients:  140 - 180 mg/dL    Latest Reference Range & Units 05/25/23 08:22 05/25/23 12:15 05/25/23 15:15 05/25/23 17:59 05/25/23 21:00 05/26/23 08:18 05/26/23 15:44 05/26/23 20:50  Glucose-Capillary 70 - 99 mg/dL 161 (H) 096 (H)  Novolog 2 units @ 13:02 65 (L) 112 (H) 100 (H) 105 (H) 173 (H)  Novolog 2 units @17 :53 75  (H): Data is abnormally high (L): Data is abnormally low Review of Glycemic Control  Diabetes history: DM2 Outpatient Diabetes medications: None Current orders for Inpatient glycemic control: Novolog 0-9 units TID with meals, Novolog 0-5 units QHS   Inpatient Diabetes Program Recommendations:     Insulin: CBG down to 65 mg/dl at 04:54 on 09/81/19 after getting Novolog correction and CBG down to 75 mg/dl after getting Novolog correction.   Please decrease Novolog correction to 0-6 units TID with meals.   Thanks, Orlando Penner, RN, MSN, CDCES Diabetes Coordinator Inpatient Diabetes Program [phone] (Team Pager from 8am to 5pm)

## 2023-05-27 NOTE — Progress Notes (Signed)
Progress Note   Patient: Theresa [name]:[id number] DOB: [birth year] DOA: 05/19/2023     8 DOS: the patient was seen and examined on 05/27/2023   Brief hospital course: HPI on admission: "Theresa [name] is a 67 y.o. female with medical history significant of  Anemia of chronic disease, COPD coronary artery disease status post stent placement, Diabetes mellitus with diabetic neuropathy and retinopathy, previous history of ESRD on hemodialysis currently status post transplant, hyperlipidemia, Hypertension moderate to severe mitral regurgitation, peripheral vascular disease who is a resident in a group home brought in on account of worsening gangrene noted on the right foot.  ..." See H&P for full HPI on admission & ED course.  Patient was admitted to the hospital, started on IV heparin and Vascular Surgery was consulted, plan for angiogram today (12/11).  Further hospital course and management as outlined below.   Assessment and Plan:  Right foot gangrene, s/p AKA done on 12/12 No signs of infection identified, no antibiotics indicated at this time --s/p IV heparin drip --Vascular surgery following, Dr. Wyn Quaker --12/11 -- underwent angiogram with angioplasty to right SFA and popliteal arteries --12/12 -- underwent Right AKA --Initial dressing change by vascular this weekend --Started on Plavix 75 mg daily --Lovenox for DVT ppx started - dc given dropping platelets --OOB to chair --PT/OT evaluations -- return to SNF anticipated 12/17 dressing was changed by vascular surgery, no complications --Follow up Vascular's recommendations  Thrombocytopenia -- appears acute on chronic. Worsening since admission, platelets now in 70's --Hold off Lovenox --Also on Plavix - continue for now --Check HIT Ab --Daily CBC's to monitor Plts 88 stable  Hypokalemia - resolved with replacement --Monitor BMP, replace K PRN  Hyperkalemia - K 5.8 on 12/12 treated and resolved --Monitor BMP    Hypovolemic hyponatremia - mild, Na 132 on admission.   Pt appeared clinically dehydrated. --Encourage oral hydration --Monitor BMP   Anemia of chronic disease - Hbg stable Folic acid level 6.5, at lower end and B12 level 387, goal >400. Started folic acid 1 mg p.o. daily and B12 500 mcg p.o. daily --Monitor CBC   COPD - stable, not exacerbated --PRN bronchodilators   Coronary artery disease status post stent placement --Continue statin therapy   Diabetes mellitus with diabetic neuropathy and retinopathy --Monitor CBG's --Sliding scale Novolog --Continue Lyrica  Hypoglycemia - CBG noted 44 on evening of 12/13.  Treated an improved to 175 --Hypoglycemia protocol   History of ESRD  Patient used to be on on hemodialysis currently status post transplant No longer on hemodialysis Renal function stable at baseline --On tacrolimus and azathioprine --Nephrology following   Severe hypoalbuminemia: Continue protein supplement Dietitian consulted   Hyperlipidemia: Continue Lipitor  Hypertension: --On clonidone and Imdur --Increased clonidine 0.1 mg from daily to BID for improved BP control 12/16 started losartan 25 mg p.o. daily and Lopressor 12.5 mg p.o. twice daily 12/17 increased Imdur 30 to 60 mg p.o. daily, increased Lopressor 25 mg p.o. twice daily --Monitor & adjust regimen as needed  Vitamin D insufficiency: started vitamin D 50,000 units p.o. weekly, follow with PCP to repeat vitamin D level after 3 to 6 months.  Moderate to severe mitral regurgitation --Monitor volume status  Peripheral vascular disease, now s/p R AKA --Started on Plavix per vascular   Body mass index is 22.44 kg/m.  DVT prophylaxis: SCD due to thrombocytopenia     Nutrition Documentation    Flowsheet Row ED to Hosp-Admission (Current) from 05/19/2023 in [facility] PCU  Nutrition Problem Increased nutrient needs  Etiology wound healing  Nutrition Goal Patient will meet  greater than or equal to 90% of their needs  Interventions MVI, Liberalize Diet, Boost Breeze, Prostat     ,  Active Pressure Injury/Wound(s)     Pressure Ulcer  Duration          Pressure Injury 05/20/23 Sacrum Mid Stage 3 -  Full thickness tissue loss. Subcutaneous fat may be visible but bone, tendon or muscle are NOT exposed. 2.8 cm x 4 cm  x 0.5 cm depth (under piece of stringy slough) 6 days   Pressure Injury 05/21/23 Buttocks Left;Lateral Unstageable - Full thickness tissue loss in which the base of the injury is covered by slough (yellow, tan, gray, green or brown) and/or eschar (tan, brown or black) in the wound bed. 1 cm x 1 cm 80% yellow  5 days   Pressure Injury 05/24/23 Heel Left Unstageable - Full thickness tissue loss in which the base of the injury is covered by slough (yellow, tan, gray, green or brown) and/or eschar (tan, brown or black) in the wound bed. mushy texture to the touch, white/b 1 day              Subjective: No significant events overnight, patient was in pain in the right stump because she was getting dressing changed.  Patient was not moderate distress as they were changing the dressing.  At that time patient was unable to offer any complaints other than pain in the right stump due to dressing change.   Physical Exam: Vitals:   05/26/23 2031 05/26/23 2257 05/27/23 0754 05/27/23 1128  BP: 134/62 (!) 161/51 (!) 167/41 (!) 166/59  Pulse: 70 82 76 77  Resp: 17 17 16 16   Temp: 98.7 F (37.1 C) 98.7 F (37.1 C) 98 F (36.7 C) 98.1 F (36.7 C)  TempSrc:      SpO2: 100% 99% 100% 100%  Weight:      Height:       General exam: awake, alert, no acute distress, chronically ill appearing HEENT: moist mucus membranes, hearing grossly normal  Respiratory system: lungs clear no wheezes or rhonchi, on room air, normal respiratory effort. Cardiovascular system: normal S1/S2, RRR, no pedal edema.   Gastrointestinal system: soft, NT, ND Central nervous system:  A&O x. no gross focal neurologic deficits, normal speech Extremities: s/p right AKA, dressing CDI Psychiatry: normal mood, congruent affect   Data Reviewed:  Notable labs --     Na 136 BUN 24>>21 Cr 1.40 >> 1.47 >> 1.41 >> 1.26>>1.18 Ca 8.5 Hbg 9.3>8.5 Platelets 103 >> 86 >> 82 >> 79 >> 74>>76>>94k   Family Communication: None present on rounds.    Disposition: Status is: Inpatient Remains inpatient appropriate because: needs SNF placement. cleared by vascular surgery  Planned Discharge Destination: SNF placement when bed will be available.   Time spent: 40 minutes  Author: Gillis Santa, MD 05/27/2023 3:46 PM  For on call review [email or web address].

## 2023-05-27 NOTE — [place] FL2 (Signed)
[place] MEDICAID FL2 LEVEL OF CARE FORM     IDENTIFICATION  Patient Name: Theresa [name] Birthdate: 02-21-1956 Sex: female Admission Date (Current Location): 05/19/2023  [place] and [place] Number:      Facility and Address:  [facility], [address] [place], [place] [address]      Provider Number: [id number]  Attending Physician Name and Address:  Gillis Santa, MD  Relative Name and Phone Number:  Charnell, Brigham)  [phone] [facility])    Current Level of Care: Hospital Recommended Level of Care: Skilled Nursing Facility Prior Approval Number:    Date Approved/Denied:   PASRR Number:    Discharge Plan: SNF    Current Diagnoses: Patient Active Problem List   Diagnosis Date Noted   Gangrene of right foot (HCC) 05/20/2023   Arterial occlusion 05/20/2023   Diabetes mellitus with foot ulcer and gangrene (HCC) 05/19/2023   Annual physical exam 01/12/2023   Chronic obstructive pulmonary disease (HCC) 01/12/2023   Need for vaccination 01/12/2023   Postmenopausal estrogen deficiency 01/12/2023   Mass of right breast 07/03/2021   Hair loss 05/22/2021   Lung nodules 12/04/2020   Lumbar radiculopathy 02/27/2020   Aortic atherosclerosis (HCC) 02/24/2020   Vitamin D deficiency 06/20/2019   Kidney transplanted 03/30/2019   Dementia (HCC) 02/28/2019   Allergic rhinitis 09/23/2018   Valvular heart disease 01/05/2018   Thyroid nodule 08/11/2017   CHF (congestive heart failure) (HCC) 07/26/2017   PAD (peripheral artery disease) (HCC) 03/07/2017   Bilateral carotid artery stenosis 02/17/2017   OSA on CPAP 12/20/2015   Osteoarthritis of right hip 08/23/2013   DDD (degenerative disc disease), lumbosacral 08/23/2013   Diabetic retinopathy (HCC) 05/28/2013   Hyperlipidemia    End stage renal disease (HCC) 11/08/2012   Essential hypertension 11/08/2012   Diabetic neuropathy (HCC) 02/18/2012   Anemia in chronic kidney disease (CKD) 02/18/2012    Diabetes mellitus, type II (HCC) 06/30/2011   Heart murmur 06/13/2011    Orientation RESPIRATION BLADDER Height & Weight     Self, Place    Incontinent, External catheter Weight: 65 kg Height:  5\' 7"  (170.2 cm)  BEHAVIORAL SYMPTOMS/MOOD NEUROLOGICAL BOWEL NUTRITION STATUS  Other (Comment) (n/a)  (n/a) Incontinent Diet  AMBULATORY STATUS COMMUNICATION OF NEEDS Skin   Limited Assist Verbally PU Stage and Appropriate Care, Other (Comment) (Left lateral buttock, and left heel unstageable incisons to R&L groin)     PU Stage 3 Dressing: Daily                 Personal Care Assistance Level of Assistance  Bathing, Feeding, Dressing Bathing Assistance: Limited assistance Feeding assistance: Limited assistance Dressing Assistance: Limited assistance     Functional Limitations Info             SPECIAL CARE FACTORS FREQUENCY  PT (By licensed PT), OT (By licensed OT)     PT Frequency: Min 2x weekly OT Frequency: Min 2x weekly            Contractures Contractures Info: Not present    Additional Factors Info  Code Status, Allergies Code Status Info: DNR Allergies Info: Hydrocodone, Ondansetron, Lisinopril           Current Medications (05/27/2023):  This is the current hospital active medication list Current Facility-Administered Medications  Medication Dose Route Frequency Provider Last Rate Last Admin   ascorbic acid (VITAMIN C) tablet 500 mg  500 mg Oral BID Esaw Grandchild A, DO   500 mg at 05/27/23 1142  atorvastatin (LIPITOR) tablet 20 mg  20 mg Oral Daily Annice Needy, MD   20 mg at 05/26/23 2102   azaTHIOprine (IMURAN) tablet 100 mg  100 mg Oral Daily Annice Needy, MD   100 mg at 05/27/23 1107   Chlorhexidine Gluconate Cloth 2 % PADS 6 each  6 each Topical Daily Annice Needy, MD   6 each at 05/27/23 1143   cloNIDine (CATAPRES) tablet 0.1 mg  0.1 mg Oral BID Esaw Grandchild A, DO   0.1 mg at 05/27/23 4098   clopidogrel (PLAVIX) tablet 75 mg  75 mg Oral Daily  Pace, Brien R, NP   75 mg at 05/27/23 1141   cyanocobalamin (VITAMIN B12) tablet 500 mcg  500 mcg Oral Daily Gillis Santa, MD   500 mcg at 05/27/23 1143   feeding supplement (BOOST / RESOURCE BREEZE) liquid 1 Container  1 Container Oral TID BM Esaw Grandchild A, DO   1 Container at 05/27/23 0912   folic acid (FOLVITE) tablet 1 mg  1 mg Oral Daily Gillis Santa, MD   1 mg at 05/27/23 1140   haloperidol lactate (HALDOL) injection 2 mg  2 mg Intravenous Q6H PRN Gillis Santa, MD       hydrALAZINE (APRESOLINE) injection 10 mg  10 mg Intravenous Q6H PRN Gillis Santa, MD   10 mg at 05/25/23 1054   insulin aspart (novoLOG) injection 0-5 Units  0-5 Units Subcutaneous QHS Annice Needy, MD       insulin aspart (novoLOG) injection 0-9 Units  0-9 Units Subcutaneous TID WC Annice Needy, MD   2 Units at 05/26/23 1753   ipratropium-albuterol (DUONEB) 0.5-2.5 (3) MG/3ML nebulizer solution 3 mL  3 mL Nebulization Q6H PRN Annice Needy, MD       isosorbide mononitrate (IMDUR) 24 hr tablet 60 mg  60 mg Oral Daily Gillis Santa, MD   60 mg at 05/27/23 1108   leptospermum manuka honey (MEDIHONEY) paste 1 Application  1 Application Topical Daily Annice Needy, MD   1 Application at 05/27/23 1109   losartan (COZAAR) tablet 25 mg  25 mg Oral Daily Gillis Santa, MD   25 mg at 05/27/23 1142   metoprolol tartrate (LOPRESSOR) tablet 50 mg  50 mg Oral BID Gillis Santa, MD   50 mg at 05/27/23 1107   morphine (PF) 2 MG/ML injection 2 mg  2 mg Intravenous Q3H PRN Esaw Grandchild A, DO   2 mg at 05/27/23 0112   multivitamin with minerals tablet 1 tablet  1 tablet Oral Daily Esaw Grandchild A, DO   1 tablet at 05/27/23 1143   Oral care mouth rinse  15 mL Mouth Rinse PRN Gillis Santa, MD       oxyCODONE (Oxy IR/ROXICODONE) immediate release tablet 5-10 mg  5-10 mg Oral Q4H PRN Esaw Grandchild A, DO   10 mg at 05/26/23 0958   pantoprazole (PROTONIX) EC tablet 40 mg  40 mg Oral Daily Annice Needy, MD   40 mg at 05/27/23 1142    pregabalin (LYRICA) capsule 75 mg  75 mg Oral BID Annice Needy, MD   75 mg at 05/27/23 1149   QUEtiapine (SEROQUEL) tablet 25 mg  25 mg Oral BID Gillis Santa, MD   25 mg at 05/27/23 0911   tacrolimus ER (ENVARSUS XR) tablet 10 mg  10 mg Oral QAC breakfast Annice Needy, MD   10 mg at 05/27/23 1191   Vitamin D (Ergocalciferol) (DRISDOL) 1.25  MG (50000 UNIT) capsule 50,000 Units  50,000 Units Oral Q7 days Gillis Santa, MD       zinc sulfate (50mg  elemental zinc) capsule 220 mg  220 mg Oral Daily Esaw Grandchild A, DO   220 mg at 05/27/23 1141     Discharge Medications: Please see discharge summary for a list of discharge medications.  Relevant Imaging Results:  Relevant Lab Results:   Additional Information SSN# [id number]  Truddie Hidden, RN

## 2023-05-27 NOTE — TOC Progression Note (Signed)
Transition of Care [facility]) - Progression Note    Patient Details  Name: Theresa [name] MRN: [id number] Date of Birth: 1955/09/08  Transition of [facility]) CM/SW Contact  Truddie Hidden, RN Phone Number: 05/27/2023, 4:17 PM  Clinical Narrative:    Bed search initiated.        Expected Discharge Plan and Services                                               Social Determinants of Health (SDOH) Interventions SDOH Screenings   Food Insecurity: Patient Unable To Answer (05/20/2023)  Housing: High Risk (05/20/2023)  Transportation Needs: Patient Unable To Answer (05/20/2023)  Utilities: Patient Unable To Answer (05/20/2023)  Alcohol Screen: Low Risk  (12/17/2022)  Depression (PHQ2-9): Medium Risk (12/31/2022)  Financial Resource Strain: Low Risk  (02/13/2023)   Received from [facility]  Physical Activity: Inactive (12/17/2022)  Social Connections: Socially Isolated (12/17/2022)  Stress: No Stress Concern Present (12/17/2022)  Tobacco Use: Medium Risk (05/21/2023)  Health Literacy: Low Risk  (09/13/2020)   [facility]    Readmission Risk Interventions     No data to display

## 2023-05-27 NOTE — Plan of Care (Signed)
  Problem: Education: Goal: Ability to describe self-care measures that may prevent or decrease complications (Diabetes Survival Skills Education) will improve Outcome: Progressing   Problem: Coping: Goal: Ability to adjust to condition or change in health will improve Outcome: Progressing   Problem: Health Behavior/Discharge Planning: Goal: Ability to identify and utilize available resources and services will improve Outcome: Progressing Goal: Ability to manage health-related needs will improve Outcome: Progressing

## 2023-05-27 NOTE — Progress Notes (Signed)
Patient refusing CBG checks. Education provided and patient continues to refuse. Dr. Lucianne Muss made aware.

## 2023-05-27 NOTE — Care Management Important Message (Signed)
Important Message  Patient Details  Name: Theresa [name] MRN: [id number] Date of Birth: [birth year]   Important Message Given:  Yes - Medicare IM     [name] 05/27/2023, 3:13 PM

## 2023-05-28 DIAGNOSIS — E1152 Type 2 diabetes mellitus with diabetic peripheral angiopathy with gangrene: Secondary | ICD-10-CM | POA: Diagnosis not present

## 2023-05-28 DIAGNOSIS — L97509 Non-pressure chronic ulcer of other part of unspecified foot with unspecified severity: Secondary | ICD-10-CM | POA: Diagnosis not present

## 2023-05-28 DIAGNOSIS — E11621 Type 2 diabetes mellitus with foot ulcer: Secondary | ICD-10-CM | POA: Diagnosis not present

## 2023-05-28 LAB — BASIC METABOLIC PANEL
Anion gap: 8 (ref 5–15)
BUN: 19 mg/dL (ref 8–23)
CO2: 21 mmol/L — ABNORMAL LOW (ref 22–32)
Calcium: 8.4 mg/dL — ABNORMAL LOW (ref 8.9–10.3)
Chloride: 105 mmol/L (ref 98–111)
Creatinine, Ser: 1.09 mg/dL — ABNORMAL HIGH (ref 0.44–1.00)
GFR, Estimated: 56 mL/min — ABNORMAL LOW (ref >60)
Glucose, Bld: 98 mg/dL (ref 70–99)
Potassium: 4.5 mmol/L (ref 3.5–5.1)
Sodium: 134 mmol/L — ABNORMAL LOW (ref 135–145)

## 2023-05-28 LAB — GLUCOSE, CAPILLARY
Glucose-Capillary: 107 mg/dL — ABNORMAL HIGH (ref 70–99)
Glucose-Capillary: 204 mg/dL — ABNORMAL HIGH (ref 70–99)
Glucose-Capillary: 133 mg/dL — ABNORMAL HIGH (ref 70–99)
Glucose-Capillary: 45 mg/dL — ABNORMAL LOW (ref 70–99)
Glucose-Capillary: 146 mg/dL — ABNORMAL HIGH (ref 70–99)

## 2023-05-28 LAB — CBC
HCT: 26.2 % — ABNORMAL LOW (ref 36.0–46.0)
Hemoglobin: 8.4 g/dL — ABNORMAL LOW (ref 12.0–15.0)
MCH: 32.2 pg (ref 26.0–34.0)
MCHC: 32.1 g/dL (ref 30.0–36.0)
MCV: 100.4 fL — ABNORMAL HIGH (ref 80.0–100.0)
Platelets: 102 10*3/uL — ABNORMAL LOW (ref 150–400)
RBC: 2.61 MIL/uL — ABNORMAL LOW (ref 3.87–5.11)
RDW: 16.5 % — ABNORMAL HIGH (ref 11.5–15.5)
WBC: 5.3 10*3/uL (ref 4.0–10.5)
nRBC: 0 % (ref 0.0–0.2)

## 2023-05-28 LAB — VITAMIN A: Vitamin A (Retinoic Acid): 9.2 ug/dL — ABNORMAL LOW (ref 22.0–69.5)

## 2023-05-28 LAB — MAGNESIUM: Magnesium: 2.1 mg/dL (ref 1.7–2.4)

## 2023-05-28 LAB — PHOSPHORUS: Phosphorus: 3.2 mg/dL (ref 2.5–4.6)

## 2023-05-28 MED ORDER — ENOXAPARIN SODIUM 40 MG/0.4ML IJ SOSY
40.0000 mg | PREFILLED_SYRINGE | INTRAMUSCULAR | Status: DC
Start: 2023-05-28 — End: 2023-06-08
  Administered 2023-05-30 – 2023-06-06 (×7): 40 mg via SUBCUTANEOUS
  Filled 2023-05-28 (×9): qty 0.4

## 2023-05-28 MED ORDER — HYDRALAZINE HCL 50 MG PO TABS
50.0000 mg | ORAL_TABLET | Freq: Four times a day (QID) | ORAL | Status: DC | PRN
  Administered 2023-05-28 – 2023-06-03 (×3): 50 mg via ORAL
  Filled 2023-05-28 (×3): qty 1

## 2023-05-28 MED ORDER — TRAZODONE HCL 50 MG PO TABS
150.0000 mg | ORAL_TABLET | Freq: Every day | ORAL | Status: DC
  Administered 2023-05-30 – 2023-06-06 (×7): 150 mg via ORAL
  Filled 2023-05-28 (×9): qty 1

## 2023-05-28 MED ORDER — DEXTROSE 50 % IV SOLN
12.5000 g | INTRAVENOUS | Status: AC
  Administered 2023-05-28: 12.5 g via INTRAVENOUS
  Filled 2023-05-28: qty 50

## 2023-05-28 MED ORDER — OLANZAPINE 5 MG PO TABS
5.0000 mg | ORAL_TABLET | ORAL | Status: DC
Start: 2023-05-28 — End: 2023-05-29
  Administered 2023-05-28 (×2): 5 mg via ORAL
  Filled 2023-05-28 (×4): qty 1

## 2023-05-28 MED ORDER — HYDRALAZINE HCL 20 MG/ML IJ SOLN
10.0000 mg | Freq: Four times a day (QID) | INTRAMUSCULAR | Status: DC | PRN
  Administered 2023-05-29 – 2023-06-06 (×3): 10 mg via INTRAVENOUS
  Filled 2023-05-28 (×4): qty 1

## 2023-05-28 NOTE — Plan of Care (Signed)
  Problem: Pain Management: Goal: General experience of comfort will improve Outcome: Not Progressing   Problem: Clinical Measurements: Goal: Cardiovascular complication will be avoided Outcome: Progressing   Problem: Metabolic: Goal: Ability to maintain appropriate glucose levels will improve Outcome: Progressing   Problem: Health Behavior/Discharge Planning: Goal: Ability to identify and utilize available resources and services will improve Outcome: Not Progressing

## 2023-05-28 NOTE — Progress Notes (Signed)
Physical Therapy Treatment Patient Details Name: Theresa [name] MRN: [id number] DOB: 1955/10/30 Today's Date: 05/28/2023   History of Present Illness Pt is s/p R AKA 2/2 R foot and lower leg gangrene. PMH of CAD s/p stent, HTN, COPD, MI, CHF, dementia, DMII, previous ESRD now s/p transplant, HLD, PVD.    PT Comments  Patient supine in bed on arrival and undressed. Patient agitated with PT arrival and stating "leave me alone" despite encouragement. Briefly willing to participate minimally. Required totalA+2 to come to EOB and patient actively resisting sitting EOB. Upon sitting on EOB, patient became minimally responsive and returned to supine. Pulse checked with good pulse, sternal rub performed with minimal response, and BP assessed 141/56. Patient minimally responsive to all painful stimuli and cold wash cloth on face. RN notified and in to assess. Unsure if patient's response is due to agitation or natural response of sitting EOB. RN in room at end of session to monitor. Discharge plan remains appropriate.      If plan is discharge home, recommend the following: Two people to help with bathing/dressing/bathroom;Two people to help with walking and/or transfers;Direct supervision/assist for medications management;Help with stairs or ramp for entrance;Assist for transportation;Assistance with cooking/housework;Assistance with feeding;Direct supervision/assist for financial management;Supervision due to cognitive status   Can travel by private vehicle     No  Equipment Recommendations  Other (comment)    Recommendations for Other Services       Precautions / Restrictions Precautions Precautions: Fall Restrictions Weight Bearing Restrictions Per Provider Order: Yes RLE Weight Bearing Per Provider Order: Non weight bearing Other Position/Activity Restrictions: R AKA     Mobility  Bed Mobility Overal bed mobility: Needs Assistance Bed Mobility: Supine to Sit, Sit to Supine     Supine  to sit: Total assist, +2 for physical assistance, +2 for safety/equipment Sit to supine: Total assist, +2 for physical assistance, +2 for safety/equipment   General bed mobility comments: no active assistance. Physically resistant at times. Stopped responding to PT after sitting up and once laid back down. Checked pulse with active pulse, performed sternal rub with minimal response, and BP assessed 141/56. RN notified and in to assess. Unsureif patient responding in anger to PT treatment session or if natural response.    Transfers                   General transfer comment: unsafe to attempt    Ambulation/Gait                   Stairs             Wheelchair Mobility     Tilt Bed    Modified Rankin (Stroke Patients Only)       Balance Overall balance assessment: Needs assistance Sitting-balance support: No upper extremity supported, Feet supported Sitting balance-Leahy Scale: Zero                                      Cognition Arousal: Alert Behavior During Therapy: Agitated, Flat affect Overall Cognitive Status: History of cognitive impairments - at baseline                                 General Comments: initially agitated stating "just leave me alone"        Exercises      General  Comments        Pertinent Vitals/Pain Pain Assessment Pain Assessment: Faces Faces Pain Scale: Hurts little more Pain Location: R AKA site Pain Descriptors / Indicators: Grimacing, Moaning, Guarding Pain Intervention(s): Limited activity within patient's tolerance, Monitored during session, Repositioned    Home Living                          Prior Function            PT Goals (current goals can now be found in the care plan section) Acute Rehab PT Goals Patient Stated Goal: did not state PT Goal Formulation: Patient unable to participate in goal setting Time For Goal Achievement: 06/05/23 Potential to  Achieve Goals: Poor Progress towards PT goals: Not progressing toward goals - comment    Frequency    Min 1X/week      PT Plan      Co-evaluation              AM-PAC PT "6 Clicks" Mobility   Outcome Measure  Help needed turning from your back to your side while in a flat bed without using bedrails?: Total Help needed moving from lying on your back to sitting on the side of a flat bed without using bedrails?: Total Help needed moving to and from a bed to a chair (including a wheelchair)?: Total Help needed standing up from a chair using your arms (e.g., wheelchair or bedside chair)?: Total Help needed to walk in hospital room?: Total Help needed climbing 3-5 steps with a railing? : Total 6 Click Score: 6    End of Session   Activity Tolerance: Treatment limited secondary to agitation Patient left: in bed;with call bell/phone within reach;with bed alarm set Nurse Communication: Mobility status PT Visit Diagnosis: Other abnormalities of gait and mobility (R26.89);Difficulty in walking, not elsewhere classified (R26.2);Muscle weakness (generalized) (M62.81);Pain Pain - Right/Left: Right Pain - part of body: Leg     Time: 7829-5621 PT Time Calculation (min) (ACUTE ONLY): 15 min  Charges:    $Therapeutic Activity: 8-22 mins PT General Charges $$ ACUTE PT VISIT: 1 Visit                     Maylon Peppers, PT, DPT Physical Therapist - [facility]  [facility]    Kimya Mccahill A Theora Vankirk 05/28/2023, 3:42 PM

## 2023-05-28 NOTE — Progress Notes (Addendum)
Progress Note   Patient: Theresa [name] NFA:[id number] DOB: 04/27/1956 DOA: 05/19/2023     9 DOS: the patient was seen and examined on 05/28/2023   Brief hospital course: HPI on admission: "KORTNY SULEMAN is a 67 y.o. female with medical history significant of  Anemia of chronic disease, COPD coronary artery disease status post stent placement, Diabetes mellitus with diabetic neuropathy and retinopathy, previous history of ESRD on hemodialysis currently status post transplant, hyperlipidemia, Hypertension moderate to severe mitral regurgitation, peripheral vascular disease who is a resident in a group home brought in on account of worsening gangrene noted on the right foot.  ..." See H&P for full HPI on admission & ED course.  Patient was admitted to the hospital, started on IV heparin and Vascular Surgery was consulted, plan for angiogram today (12/11).  Further hospital course and management as outlined below.   Assessment and Plan:  Right foot gangrene, s/p AKA done on 12/12 No signs of infection identified, no antibiotics indicated at this time --s/p IV heparin drip --Vascular surgery following, Dr. Wyn Quaker --12/11 -- underwent angiogram with angioplasty to right SFA and popliteal arteries --12/12 -- underwent Right AKA --Initial dressing change by vascular this weekend --Started on Plavix 75 mg daily --Lovenox for DVT ppx started - dc given dropping platelets --OOB to chair --PT/OT evaluations -- return to SNF anticipated 12/17 dressing was changed by vascular surgery, no complications --Follow up Vascular's recommendations  Thrombocytopenia -- appears acute on chronic. Worsening since admission, platelets now in 70's --Hold off Lovenox --Also on Plavix - continue for now --Check HIT Ab --Daily CBC's to monitor Plts 88 stable  Hypokalemia - resolved with replacement --Monitor BMP, replace K PRN  Hyperkalemia - K 5.8 on 12/12 treated and resolved --Monitor BMP    Hypovolemic hyponatremia - mild, Na 132 on admission.   Pt appeared clinically dehydrated. --Encourage oral hydration --Monitor BMP   Anemia of chronic disease - Hbg stable Folic acid level 6.5, at lower end and B12 level 387, goal >400. Started folic acid 1 mg p.o. daily and B12 500 mcg p.o. daily --Monitor CBC   COPD - stable, not exacerbated --PRN bronchodilators   Coronary artery disease status post stent placement --Continue statin therapy   Diabetes mellitus with diabetic neuropathy and retinopathy --Monitor CBG's --Sliding scale Novolog --Continue Lyrica  Hypoglycemia - CBG noted 44 on evening of 12/13.  Treated an improved to 175 --Hypoglycemia protocol   History of ESRD  Patient used to be on on hemodialysis currently status post transplant No longer on hemodialysis Renal function stable at baseline --On tacrolimus and azathioprine --Nephrology following   Severe hypoalbuminemia: Continue protein supplement Dietitian consulted   Hyperlipidemia: Continue Lipitor  Hypertension: --On clonidone and Imdur --Increased clonidine 0.1 mg from daily to BID for improved BP control 12/16 started losartan 25 mg p.o. daily and Lopressor 12.5 mg p.o. twice daily 12/17 increased Imdur 30 to 60 mg p.o. daily, increased Lopressor 25 mg p.o. twice daily --Monitor & adjust regimen as needed  Vitamin D insufficiency: started vitamin D 50,000 units p.o. weekly, follow with PCP to repeat vitamin D level after 3 to 6 months.  Moderate to severe mitral regurgitation --Monitor volume status  Peripheral vascular disease, now s/p R AKA --Started on Plavix per vascular   Body mass index is 22.44 kg/m. DVT prophylaxis: SCD due to thrombocytopenia, Plts improved Lenoxaparin (LOVENOX) injection 40 mg Start: 05/28/23 2200    Nutrition Documentation    Flowsheet Row ED to  Hosp-Admission (Current) from 05/19/2023 in [facility]  Nutrition Problem Increased  nutrient needs  Etiology wound healing  Nutrition Goal Patient will meet greater than or equal to 90% of their needs  Interventions MVI, Liberalize Diet, Boost Breeze, Prostat     ,  Active Pressure Injury/Wound(s)     Pressure Ulcer  Duration          Pressure Injury 05/20/23 Sacrum Mid Stage 3 -  Full thickness tissue loss. Subcutaneous fat may be visible but bone, tendon or muscle are NOT exposed. 2.8 cm x 4 cm  x 0.5 cm depth (under piece of stringy slough) 6 days   Pressure Injury 05/21/23 Buttocks Left;Lateral Unstageable - Full thickness tissue loss in which the base of the injury is covered by slough (yellow, tan, gray, green or brown) and/or eschar (tan, brown or black) in the wound bed. 1 cm x 1 cm 80% yellow  5 days   Pressure Injury 05/24/23 Heel Left Unstageable - Full thickness tissue loss in which the base of the injury is covered by slough (yellow, tan, gray, green or brown) and/or eschar (tan, brown or black) in the wound bed. mushy texture to the touch, white/b 1 day              Subjective: No significant events overnight, patient was not in a good mood, stated that to leave her alone.  Patient did not offer any complaints, seems to be in pain.  Physical Exam: Vitals:   05/27/23 2352 05/28/23 0444 05/28/23 0850 05/28/23 1332  BP: (!) 175/37 (!) 163/36 (!) 175/56 (!) 123/49  Pulse: 70 62 70 62  Resp: 20 20 20  (!) 22  Temp: 98.3 F (36.8 C) 98.4 F (36.9 C) 98.5 F (36.9 C) 97.9 F (36.6 C)  TempSrc:      SpO2: 99% 100% 100% 100%  Weight:      Height:       General exam: awake, alert, no acute distress, chronically ill appearing HEENT: moist mucus membranes, hearing grossly normal  Respiratory system: lungs clear no wheezes or rhonchi, on room air, normal respiratory effort. Cardiovascular system: normal S1/S2, RRR, no pedal edema.   Gastrointestinal system: soft, NT, ND Central nervous system: A&O x. no gross focal neurologic deficits, normal  speech Extremities: s/p right AKA, dressing CDI Psychiatry: normal mood, congruent affect   Data Reviewed:  Notable labs --     Na 136>>134 BUN 24>>21 Cr 1.40 >> 1.47 >> 1.41 >> 1.26>>1.18>>1.09 Ca 8.5 Hbg 9.3>8.4 Platelets 103 >>> 74>>102k   Family Communication: None present on rounds.    Disposition: Status is: Inpatient Remains inpatient appropriate because: needs SNF placement. cleared by vascular surgery  Planned Discharge Destination: SNF placement when bed will be available.  enoxaparin (LOVENOX) injection 40 mg Start: 05/28/23 2200 Time spent: 40 minutes  Author: Gillis Santa, MD 05/28/2023 3:42 PM  For on call review [email or web address].

## 2023-05-28 NOTE — TOC Progression Note (Signed)
Transition of Care [facility]) - Progression Note    Patient Details  Name: Theresa [name] MRN: [id number] Date of Birth: 1955-11-09  Transition of [facility]) CM/SW Contact  Truddie Hidden, RN Phone Number: 05/28/2023, 9:00 AM  Clinical Narrative:    No current bed offers.        Expected Discharge Plan and Services                                               Social Determinants of Health (SDOH) Interventions SDOH Screenings   Food Insecurity: Patient Unable To Answer (05/20/2023)  Housing: High Risk (05/20/2023)  Transportation Needs: Patient Unable To Answer (05/20/2023)  Utilities: Patient Unable To Answer (05/20/2023)  Alcohol Screen: Low Risk  (12/17/2022)  Depression (PHQ2-9): Medium Risk (12/31/2022)  Financial Resource Strain: Low Risk  (02/13/2023)   Received from [facility]  Physical Activity: Inactive (12/17/2022)  Social Connections: Socially Isolated (12/17/2022)  Stress: No Stress Concern Present (12/17/2022)  Tobacco Use: Medium Risk (05/21/2023)  Health Literacy: Low Risk  (09/13/2020)   Received from [facility]    Readmission Risk Interventions     No data to display

## 2023-05-28 NOTE — Consult Note (Addendum)
[facility] Face-to-Face Psychiatry Consult   Reason for Consult: Depression and agitation. Referring Physician:  Gillis Santa, MD  Patient Identification: Theresa [name] MRN:  [id number] Principal Diagnosis: Diabetes mellitus with foot ulcer and gangrene (HCC) Diagnosis:  Principal Problem:   Diabetes mellitus with foot ulcer and gangrene (HCC) Active Problems:   Gangrene of right foot (HCC)   Arterial occlusion   Total Time spent with patient: 20 minutes  Subjective:   Theresa [name] is a 67 y.o. female patient admitted with complications of diabetes.  She resides at a group home and underwent below the knee amputation.  She is very irritable but she does deny being depressed.  She could probably benefit from something like Zyprexa that would help with her mood and agitation at the same time instead of having 2 separate medications.  She obviously has vascular dementia, too.    Past Psychiatric History: Unremarkable  Risk to Self:   Risk to Others:   Prior Inpatient Therapy:   Prior Outpatient Therapy:    Past Medical History:  Past Medical History:  Diagnosis Date   Anemia of chronic disease    Anginal pain (HCC)    Breast calcification seen on mammogram 04/08/2021   Carotid arterial disease (HCC)    a. 02/2013 U/S: 40-59% bilat ICA stenosis.   Chronic constipation    Chronic diastolic CHF (congestive heart failure) (HCC)    a. 10/2013 Echo [facility]): EF 55-60%, mod conc LVH, mod MR, mildly dil LA, mild Ao sclerosis w/o stenosis; b. 02/2016 Echo: EF 55-60%, no rwma, Gr DD, mild AS, mod to sev MR, mildly dil LA, PASP ; c. 07/2017 Echo [facility]): EF >55%, mild to mod LVH, Gr2 DD, Ao scl, Mod MR, mod dil LA, nl RV fxn.   Colon polyps    COPD (chronic obstructive pulmonary disease) (HCC)    Coronary artery disease    a. 05/2013 NSTEMI/PCI: LM 20d, LAD min irregs, LCX small, nl, OM1 nl, RCA dom 16m (2.5x16 Promus DES), PDA1 80p; b. 04/2015 Cath: LM nl, LAD 81m, D1/2 min irregs, LCX 35p/m,  OM2/3 min irregs, RCA patent mid stent, RPDA 70ost, RPLB1 30, RPLB2/3 min irregs, EF 55-65%--> Med Rx; c. 06/2017 MV [facility]): No ischemia/infarct, EF 64%.   Coronary artery disease of native artery of native heart with stable angina pectoris (HCC) 06/27/2013   S/p drug eluting stent RCA 05/2013   COVID-19    06/2021   Diabetes mellitus    Diabetic neuropathy (HCC)    Diabetic retinopathy (HCC) 05/28/2013   Hx bilat retinal detachment, proliferative diab retinopathy and bilat vitreous hemorrhage    Emphysema    ESRD on hemodialysis (HCC)    a. [facility] in [place], [place] [phone]/Dr. [name], on a MWF schedule.  She started dialysis in Feb 2014.  Etiology of renal failure not known, likely diabetes.  Has a left upper arm AV graft.   History of bronchitis    Mar 2012   History of heart attack 01/05/2018   History of pneumonia    June 2012   History of tobacco abuse    a. Quit 2012.   Hypercalcemia 02/27/2020   Hyperkalemia 04/18/2020   Hyperlipidemia    Hypertension    Hyponatremia 05/22/2021   Moderate to severe mitral insufficiency    a. 10/2013 Echo: EF 55-60%, mod MR; b. 02/2016 Echo: EF 55-60%, mod to sev MR directed posteriorly--felt to be dynamic -worse with volume overload; c. 07/2017 Echo [facility]): EF >55%. Mod MR.  Myocardial infarct [facility]) 05/2013   Neuritis or radiculitis due to rupture of lumbar intervertebral disc 11/28/2014   Peripheral vascular disease (HCC)    Sickle cell trait (HCC)    Thyroid nodule    [place] 06/2017 due to f/u US in 1 year    Valvular heart disease    echo 06/17/21 mild to moderate    Past Surgical History:  Procedure Laterality Date   A/V FISTULAGRAM Left 11/10/2017   Procedure: A/V FISTULAGRAM;  Surgeon: Renford Dills, MD;  Location: [facility] INVASIVE CV LAB;  Service: Cardiovascular;  Laterality: Left;   A/V SHUNTOGRAM Left 02/08/2019   Procedure: A/V SHUNTOGRAM;  Surgeon: Renford Dills, MD;  Location: [facility] INVASIVE CV LAB;  Service:  Cardiovascular;  Laterality: Left;   ABDOMINAL HYSTERECTOMY     2000 for cysts on ovaries total no abnormal pap   AMPUTATION Right 05/21/2023   Procedure: AMPUTATION ABOVE KNEE;  Surgeon: Annice Needy, MD;  Location: [facility] ORS;  Service: Vascular;  Laterality: Right;   CARDIAC CATHETERIZATION     CARDIAC CATHETERIZATION N/A 05/02/2015   Procedure: Left Heart Cath and Coronary Angiography;  Surgeon: Iran Ouch, MD;  Location: [facility] INVASIVE CV LAB;  Service: Cardiovascular;  Laterality: N/A;   COLONOSCOPY WITH PROPOFOL N/A 07/08/2016   Procedure: COLONOSCOPY WITH PROPOFOL;  Surgeon: Wyline Mood, MD;  Location: [facility] ENDOSCOPY;  Service: Endoscopy;  Laterality: N/A;   COLONOSCOPY WITH PROPOFOL N/A 08/20/2017   Procedure: COLONOSCOPY WITH PROPOFOL;  Surgeon: Wyline Mood, MD;  Location: [facility] ENDOSCOPY;  Service: Gastroenterology;  Laterality: N/A;   COLONOSCOPY WITH PROPOFOL N/A 09/06/2021   Procedure: COLONOSCOPY WITH PROPOFOL;  Surgeon: Wyline Mood, MD;  Location: [facility] ENDOSCOPY;  Service: Gastroenterology;  Laterality: N/A;   colonscopy     CORONARY ANGIOPLASTY  05/28/2014   stent placement to the mid RCA   CYSTOURETHROSCOPY     [facility] urology with stent placement 11 or 05/2019 and stent removal 06/13/19    DILATION AND CURETTAGE OF UTERUS     several in the early 80's   ESOPHAGOGASTRODUODENOSCOPY     2012   ESOPHAGOGASTRODUODENOSCOPY N/A 09/06/2021   Procedure: ESOPHAGOGASTRODUODENOSCOPY (EGD);  Surgeon: Wyline Mood, MD;  Location: [facility] ENDOSCOPY;  Service: Gastroenterology;  Laterality: N/A;   ESOPHAGOGASTRODUODENOSCOPY (EGD) WITH PROPOFOL N/A 03/11/2018   Procedure: ESOPHAGOGASTRODUODENOSCOPY (EGD) WITH PROPOFOL;  Surgeon: Wyline Mood, MD;  Location: [facility] ENDOSCOPY;  Service: Gastroenterology;  Laterality: N/A;   EYE SURGERY     bilateral laser 2012   EYE SURGERY     right   EYE SURGERY     x4 both eyes   GAS INSERTION  09/30/2011   Procedure: INSERTION OF GAS;  Surgeon: Sherrie George, MD;  Location: [facility] OR;  Service: Ophthalmology;  Laterality: Right;  [id number]   GAS/FLUID EXCHANGE  09/30/2011   Procedure: GAS/FLUID EXCHANGE;  Surgeon: Sherrie George, MD;  Location: [facility] OR;  Service: Ophthalmology;  Laterality: Right;   KIDNEY TRANSPLANT     03/30/19 Dr. Vernona Rieger Thomas/Dr. Trinna Post Zendel/Dr. Sundra Aland; [facility] transplant Dr. Clelia Croft will be primary [phone] coordinator Sarah Wynkoop [phone]   KIDNEY TRANSPLANT     03/30/19 [facility]   LEFT HEART CATHETERIZATION WITH CORONARY ANGIOGRAM N/A 05/28/2013   Procedure: LEFT HEART CATHETERIZATION WITH CORONARY ANGIOGRAM;  Surgeon: Corky Crafts, MD;  Location: [facility] CATH LAB;  Service: Cardiovascular;  Laterality: N/A;   LOWER EXTREMITY ANGIOGRAPHY Right 05/20/2023   Procedure: Lower Extremity Angiography;  Surgeon: [name]  S, MD;  Location: [facility] INVASIVE CV LAB;  Service: Cardiovascular;  Laterality: Right;   PARS PLANA VITRECTOMY  04/22/2011   Procedure: PARS PLANA VITRECTOMY WITH 25 GAUGE;  Surgeon: Sherrie George, MD;  Location: [facility] OR;  Service: Ophthalmology;  Laterality: Left;  membrane peel, endolaser, gas fluid exchange, silicone oil, repair of complex traction retinal detachment   PARS PLANA VITRECTOMY  09/30/2011   Procedure: PARS PLANA VITRECTOMY WITH 25 GAUGE;  Surgeon: Sherrie George, MD;  Location: [facility] OR;  Service: Ophthalmology;  Laterality: Right;  Endolaser; Repair of Complex Traction Retinal Detachment   PARS PLANA VITRECTOMY  02/24/2012   Procedure: PARS PLANA VITRECTOMY WITH 25 GAUGE;  Surgeon: Sherrie George, MD;  Location: [facility] OR;  Service: Ophthalmology;  Laterality: Left;   PTCA     SILICON OIL REMOVAL  02/24/2012   Procedure: SILICON OIL REMOVAL;  Surgeon: Sherrie George, MD;  Location: [facility] OR;  Service: Ophthalmology;  Laterality: Left;   THROMBECTOMY / ARTERIOVENOUS GRAFT REVISION     TUBAL LIGATION     1979   UPPER EXTREMITY ANGIOGRAPHY Left 02/08/2019   Procedure: UPPER EXTREMITY  ANGIOGRAPHY;  Surgeon: Renford Dills, MD;  Location: [facility] INVASIVE CV LAB;  Service: Cardiovascular;  Laterality: Left;   Family History:  Family History  Problem Relation Age of Onset   Stroke Mother    Heart attack Mother    Heart disease Mother    Colon cancer Father    Colon cancer Sister    Breast cancer Sister    Heart attack Brother    Stroke Brother    Diabetes Brother    Diabetes Brother    Diabetes Daughter    Renal Disease Daughter        on HD   Kidney failure Daughter        age 69 as of 12/03/20   Anesthesia problems Neg Hx    Hypotension Neg Hx    Malignant hyperthermia Neg Hx    Pseudochol deficiency Neg Hx    Family Psychiatric  History: Unremarkable Social History:  Social History   Substance and Sexual Activity  Alcohol Use No     Social History   Substance and Sexual Activity  Drug Use Yes   Types: Marijuana   Comment: daily    Social History   Socioeconomic History   Marital status: Single    Spouse name: Not on file   Number of children: 2   Years of education: Not on file   Highest education level: Not on file  Occupational History   Not on file  Tobacco Use   Smoking status: Former    Current packs/day: 0.00    Average packs/day: 1 pack/day for 25.0 years (25.0 ttl pk-yrs)    Types: Cigarettes    Start date: 03/29/1994    Quit date: 03/30/2019    Years since quitting: 4.1   Smokeless tobacco: Never  Vaping Use   Vaping status: Never Used  Substance and Sexual Activity   Alcohol use: No   Drug use: Yes    Types: Marijuana    Comment: daily   Sexual activity: Never  Other Topics Concern   Not on file  Social History Narrative   On disability.    Lives with son Vaughan Basta   2 children (has son and daughter)      Son drives her since her vision has decreased   Social Drivers of Corporate investment banker Strain: Low Risk  (  02/13/2023)   Received from [facility]   Overall Financial Resource Strain (CARDIA)     Difficulty of Paying Living Expenses: Not hard at all  Food Insecurity: Patient Unable To Answer (05/20/2023)   Hunger Vital Sign    Worried About Running Out of Food in the Last Year: Patient unable to answer    Ran Out of Food in the Last Year: Patient unable to answer  Transportation Needs: Patient Unable To Answer (05/20/2023)   PRAPARE - Transportation    Lack of Transportation (Medical): Patient unable to answer    Lack of Transportation (Non-Medical): Patient unable to answer  Physical Activity: Inactive (12/17/2022)   Exercise Vital Sign    Days of Exercise per Week: 0 days    Minutes of Exercise per Session: 0 min  Stress: No Stress Concern Present (12/17/2022)   [employer] of Occupational Health - Occupational Stress Questionnaire    Feeling of Stress : Not at all  Social Connections: Socially Isolated (12/17/2022)   Social Connection and Isolation Panel [NHANES]    Frequency of Communication with Friends and Family: More than three times a week    Frequency of Social Gatherings with Friends and Family: More than three times a week    Attends Religious Services: Never    Database administrator or Organizations: No    Attends Banker Meetings: Never    Marital Status: Widowed   Additional Social History:    Allergies:   Allergies  Allergen Reactions   Hydrocodone Hives   Ondansetron Anxiety and Other (See Comments)    Causes hallucinations    Lisinopril     Unknown reaction     Labs:  Results for orders placed or performed during the hospital encounter of 05/19/23 (from the past 48 hours)  Glucose, capillary     Status: Abnormal   Collection Time: 05/26/23  3:44 PM  Result Value Ref Range   Glucose-Capillary 173 (H) 70 - 99 mg/dL    Comment: Glucose reference range applies only to samples taken after fasting for at least 8 hours.  Glucose, capillary     Status: None   Collection Time: 05/26/23  8:50 PM  Result Value Ref Range    Glucose-Capillary 75 70 - 99 mg/dL    Comment: Glucose reference range applies only to samples taken after fasting for at least 8 hours.  Basic metabolic panel     Status: Abnormal   Collection Time: 05/27/23  5:38 AM  Result Value Ref Range   Sodium 136 135 - 145 mmol/L   Potassium 4.9 3.5 - 5.1 mmol/L   Chloride 106 98 - 111 mmol/L   CO2 23 22 - 32 mmol/L   Glucose, Bld 96 70 - 99 mg/dL    Comment: Glucose reference range applies only to samples taken after fasting for at least 8 hours.   BUN 21 8 - 23 mg/dL   Creatinine, Ser 1.61 (H) 0.44 - 1.00 mg/dL   Calcium 8.5 (L) 8.9 - 10.3 mg/dL   GFR, Estimated 51 (L) >60 mL/min    Comment: (NOTE) Calculated using the CKD-EPI Creatinine Equation (2021)    Anion gap 7 5 - 15    Comment: Performed at [facility], [address] [place], [place] [address]  CBC     Status: Abnormal   Collection Time: 05/27/23  5:38 AM  Result Value Ref Range   WBC 6.1 4.0 - 10.5 K/uL   RBC 2.61 (L) 3.87 -  5.11 MIL/uL   Hemoglobin 8.4 (L) 12.0 - 15.0 g/dL   HCT 86.5 (L) 78.4 - 69.6 %   MCV 102.3 (H) 80.0 - 100.0 fL   MCH 32.2 26.0 - 34.0 pg   MCHC 31.5 30.0 - 36.0 g/dL   RDW 29.5 (H) 28.4 - 13.2 %   Platelets 94 (L) 150 - 400 K/uL    Comment: Immature Platelet Fraction may be clinically indicated, consider ordering this additional test [id number] REPEATED TO VERIFY    nRBC 0.0 0.0 - 0.2 %    Comment: Performed at [facility], [address], [place], [place] [address]  Magnesium     Status: None   Collection Time: 05/27/23  5:38 AM  Result Value Ref Range   Magnesium 2.1 1.7 - 2.4 mg/dL    Comment: Performed at [facility], [address], [place], [place] [address]  Phosphorus     Status: None   Collection Time: 05/27/23  5:38 AM  Result Value Ref Range   Phosphorus 2.7 2.5 - 4.6 mg/dL    Comment: Performed at [facility], [address] [place], [place] [address]  Glucose, capillary     Status:  Abnormal   Collection Time: 05/27/23 11:25 AM  Result Value Ref Range   Glucose-Capillary 146 (H) 70 - 99 mg/dL    Comment: Glucose reference range applies only to samples taken after fasting for at least 8 hours.  Glucose, capillary     Status: Abnormal   Collection Time: 05/27/23  9:05 PM  Result Value Ref Range   Glucose-Capillary 133 (H) 70 - 99 mg/dL    Comment: Glucose reference range applies only to samples taken after fasting for at least 8 hours.  Basic metabolic panel     Status: Abnormal   Collection Time: 05/28/23  6:23 AM  Result Value Ref Range   Sodium 134 (L) 135 - 145 mmol/L   Potassium 4.5 3.5 - 5.1 mmol/L   Chloride 105 98 - 111 mmol/L   CO2 21 (L) 22 - 32 mmol/L   Glucose, Bld 98 70 - 99 mg/dL    Comment: Glucose reference range applies only to samples taken after fasting for at least 8 hours.   BUN 19 8 - 23 mg/dL   Creatinine, Ser 6.38 (H) 0.44 - 1.00 mg/dL   Calcium 8.4 (L) 8.9 - 10.3 mg/dL   GFR, Estimated 56 (L) >60 mL/min    Comment: (NOTE) Calculated using the CKD-EPI Creatinine Equation (2021)    Anion gap 8 5 - 15    Comment: Performed at [facility], [address] [place], [place] [address]  CBC     Status: Abnormal   Collection Time: 05/28/23  6:23 AM  Result Value Ref Range   WBC 5.3 4.0 - 10.5 K/uL   RBC 2.61 (L) 3.87 - 5.11 MIL/uL   Hemoglobin 8.4 (L) 12.0 - 15.0 g/dL   HCT 32.9 (L) 51.8 - 84.1 %   MCV 100.4 (H) 80.0 - 100.0 fL   MCH 32.2 26.0 - 34.0 pg   MCHC 32.1 30.0 - 36.0 g/dL   RDW 66.0 (H) 63.0 - 16.0 %   Platelets 102 (L) 150 - 400 K/uL   nRBC 0.0 0.0 - 0.2 %    Comment: Performed at [facility], [address], [place], [place] [address]  Magnesium     Status: None   Collection Time: 05/28/23  6:23 AM  Result Value Ref Range  Magnesium 2.1 1.7 - 2.4 mg/dL    Comment: Performed at [facility], [address] [place], [place] [address]  Phosphorus     Status: None   Collection Time: 05/28/23   6:23 AM  Result Value Ref Range   Phosphorus 3.2 2.5 - 4.6 mg/dL    Comment: Performed at [facility], [address] [place], [place] [address]  Glucose, capillary     Status: Abnormal   Collection Time: 05/28/23  8:45 AM  Result Value Ref Range   Glucose-Capillary 107 (H) 70 - 99 mg/dL    Comment: Glucose reference range applies only to samples taken after fasting for at least 8 hours.    Current Facility-Administered Medications  Medication Dose Route Frequency Provider Last Rate Last Admin   ascorbic acid (VITAMIN C) tablet 500 mg  500 mg Oral BID Esaw Grandchild A, DO   500 mg at 05/28/23 0859   atorvastatin (LIPITOR) tablet 20 mg  20 mg Oral Daily Annice Needy, MD   20 mg at 05/27/23 2054   azaTHIOprine (IMURAN) tablet 100 mg  100 mg Oral Daily Annice Needy, MD   100 mg at 05/28/23 8657   Chlorhexidine Gluconate Cloth 2 % PADS 6 each  6 each Topical Daily Annice Needy, MD   6 each at 05/28/23 0849   cloNIDine (CATAPRES) tablet 0.1 mg  0.1 mg Oral BID Esaw Grandchild A, DO   0.1 mg at 05/28/23 8469   clopidogrel (PLAVIX) tablet 75 mg  75 mg Oral Daily Pace, Brien R, NP   75 mg at 05/28/23 0848   cyanocobalamin (VITAMIN B12) tablet 500 mcg  500 mcg Oral Daily Gillis Santa, MD   500 mcg at 05/28/23 0850   feeding supplement (BOOST / RESOURCE BREEZE) liquid 1 Container  1 Container Oral TID BM Esaw Grandchild A, DO   1 Container at 05/28/23 6295   folic acid (FOLVITE) tablet 1 mg  1 mg Oral Daily Gillis Santa, MD   1 mg at 05/28/23 0859   haloperidol lactate (HALDOL) injection 2 mg  2 mg Intravenous Q6H PRN Gillis Santa, MD       hydrALAZINE (APRESOLINE) injection 10 mg  10 mg Intravenous Q6H PRN Gillis Santa, MD       hydrALAZINE (APRESOLINE) tablet 50 mg  50 mg Oral Q6H PRN Gillis Santa, MD   50 mg at 05/28/23 0903   insulin aspart (novoLOG) injection 0-5 Units  0-5 Units Subcutaneous QHS Annice Needy, MD       insulin aspart (novoLOG) injection 0-9 Units  0-9 Units  Subcutaneous TID WC Annice Needy, MD   2 Units at 05/26/23 1753   ipratropium-albuterol (DUONEB) 0.5-2.5 (3) MG/3ML nebulizer solution 3 mL  3 mL Nebulization Q6H PRN Annice Needy, MD       isosorbide mononitrate (IMDUR) 24 hr tablet 60 mg  60 mg Oral Daily Gillis Santa, MD   60 mg at 05/28/23 0842   leptospermum manuka honey (MEDIHONEY) paste 1 Application  1 Application Topical Daily Annice Needy, MD   1 Application at 05/28/23 0900   losartan (COZAAR) tablet 25 mg  25 mg Oral Daily Gillis Santa, MD   25 mg at 05/28/23 0842   metoprolol tartrate (LOPRESSOR) tablet 50 mg  50 mg Oral BID Gillis Santa, MD   50 mg at 05/28/23 0841   morphine (PF) 2 MG/ML injection 2 mg  2 mg Intravenous Q3H PRN Pennie Banter,  DO   2 mg at 05/27/23 2047   multivitamin with minerals tablet 1 tablet  1 tablet Oral Daily Pennie Banter, DO   1 tablet at 05/28/23 6269   Oral care mouth rinse  15 mL Mouth Rinse PRN Gillis Santa, MD       oxyCODONE (Oxy IR/ROXICODONE) immediate release tablet 5-10 mg  5-10 mg Oral Q4H PRN Esaw Grandchild A, DO   10 mg at 05/28/23 4854   pantoprazole (PROTONIX) EC tablet 40 mg  40 mg Oral Daily Annice Needy, MD   40 mg at 05/28/23 0844   pregabalin (LYRICA) capsule 75 mg  75 mg Oral BID Annice Needy, MD   75 mg at 05/28/23 0845   QUEtiapine (SEROQUEL) tablet 25 mg  25 mg Oral BID Gillis Santa, MD   25 mg at 05/28/23 6270   tacrolimus ER (ENVARSUS XR) tablet 10 mg  10 mg Oral QAC breakfast Annice Needy, MD   10 mg at 05/28/23 0845   Vitamin D (Ergocalciferol) (DRISDOL) 1.25 MG (50000 UNIT) capsule 50,000 Units  50,000 Units Oral Q7 days Gillis Santa, MD       zinc sulfate (50mg  elemental zinc) capsule 220 mg  220 mg Oral Daily Esaw Grandchild A, DO   220 mg at 05/28/23 3500    Musculoskeletal: Strength & Muscle Tone: within normal limits Gait & Station: unable to stand Patient leans: N/A            Psychiatric Specialty Exam:  Presentation  General Appearance:  No data recorded Eye Contact:No data recorded Speech:No data recorded Speech Volume:No data recorded Handedness:No data recorded  Mood and Affect  Mood:No data recorded Affect:No data recorded  Thought Process  Thought Processes:No data recorded Descriptions of Associations:No data recorded Orientation:No data recorded Thought Content:No data recorded History of Schizophrenia/Schizoaffective disorder:No data recorded Duration of Psychotic Symptoms:No data recorded Hallucinations:No data recorded Ideas of Reference:No data recorded Suicidal Thoughts:No data recorded Homicidal Thoughts:No data recorded  Sensorium  Memory:No data recorded Judgment:No data recorded Insight:No data recorded  Executive Functions  Concentration:No data recorded Attention Span:No data recorded Recall:No data recorded Fund of Knowledge:No data recorded Language:No data recorded  Psychomotor Activity  Psychomotor Activity:No data recorded  Assets  Assets:No data recorded  Sleep  Sleep:No data recorded  Physical Exam: Physical Exam Vitals and nursing note reviewed.  Constitutional:      Appearance: Normal appearance. She is normal weight.  Neurological:     General: No focal deficit present.     Mental Status: She is alert.  Psychiatric:        Attention and Perception: Attention and perception normal.        Mood and Affect: Mood is depressed. Affect is angry.        Speech: Speech normal.        Behavior: Behavior is agitated.        Thought Content: Thought content is paranoid.        Cognition and Memory: Cognition is impaired.        Judgment: Judgment is impulsive.    Review of Systems  Constitutional: Negative.   HENT: Negative.    Eyes: Negative.   Respiratory: Negative.    Cardiovascular: Negative.   Gastrointestinal: Negative.   Genitourinary: Negative.   Musculoskeletal: Negative.   Skin: Negative.   Neurological: Negative.   Endo/Heme/Allergies: Negative.    Psychiatric/Behavioral:  Positive for memory loss.    Blood pressure (!) 175/56, pulse 70, temperature 98.5  F (36.9 C), resp. rate 20, height 5\' 7"  (1.702 m), weight 65 kg, SpO2 100%. Body mass index is 22.44 kg/m.  Treatment Plan Summary: Start Zyprexa 5 mg twice a day for depression and agitation.  Discontinue the Seroquel as it will not help with depression as much as Zyprexa.  We will also add trazodone for depression and sleep.  Disposition: Patient does not meet criteria for psychiatric inpatient admission.  Sarina Ill, DO 05/28/2023 11:49 AM

## 2023-05-28 NOTE — Consult Note (Signed)
[facility] Liaison Note  05/28/2023  Theresa [name] [birth year] [id number]  Location: RN Hospital Liaison screened the patient remotely at [facility].  Insurance: [facility]   Theresa [name] is a 67 y.o. female who is a Primary Care Patient of Dana Allan, MD -[facility]. The patient was screened for  readmission hospitalization with noted high risk score for unplanned readmission risk with 1 IP in 6 months.  The patient was assessed for potential Care Management service needs for post hospital transition for care coordination. Review of patient's electronic medical record reveals patient was admitted with DM with foot ulcer and gangrene. Pt active with [facility] for home with hospice.  [facility] Care Management/Population Health does not replace or interfere with any arrangements made by the Inpatient Transition of Care team.   For questions contact:   Elliot Cousin, RN, [facility] Liaison [place]   [facility], Population Health Office Hours MTWF  8:00 am-6:00 pm Direct Dial: [phone] mobile [phone] [Office toll free line] Office Hours are M-F 8:30 - 5 pm [email or web address]

## 2023-05-29 DIAGNOSIS — E11621 Type 2 diabetes mellitus with foot ulcer: Secondary | ICD-10-CM | POA: Diagnosis not present

## 2023-05-29 DIAGNOSIS — E1152 Type 2 diabetes mellitus with diabetic peripheral angiopathy with gangrene: Secondary | ICD-10-CM | POA: Diagnosis not present

## 2023-05-29 DIAGNOSIS — L97509 Non-pressure chronic ulcer of other part of unspecified foot with unspecified severity: Secondary | ICD-10-CM | POA: Diagnosis not present

## 2023-05-29 LAB — BASIC METABOLIC PANEL
Anion gap: 7 (ref 5–15)
BUN: 22 mg/dL (ref 8–23)
CO2: 23 mmol/L (ref 22–32)
Calcium: 8.2 mg/dL — ABNORMAL LOW (ref 8.9–10.3)
Chloride: 107 mmol/L (ref 98–111)
Creatinine, Ser: 1.35 mg/dL — ABNORMAL HIGH (ref 0.44–1.00)
GFR, Estimated: 44 mL/min — ABNORMAL LOW (ref >60)
Glucose, Bld: 94 mg/dL (ref 70–99)
Potassium: 4.7 mmol/L (ref 3.5–5.1)
Sodium: 137 mmol/L (ref 135–145)

## 2023-05-29 LAB — MAGNESIUM: Magnesium: 2.1 mg/dL (ref 1.7–2.4)

## 2023-05-29 LAB — CBC
HCT: 25.9 % — ABNORMAL LOW (ref 36.0–46.0)
Hemoglobin: 8.3 g/dL — ABNORMAL LOW (ref 12.0–15.0)
MCH: 32.7 pg (ref 26.0–34.0)
MCHC: 32 g/dL (ref 30.0–36.0)
MCV: 102 fL — ABNORMAL HIGH (ref 80.0–100.0)
Platelets: 117 10*3/uL — ABNORMAL LOW (ref 150–400)
RBC: 2.54 MIL/uL — ABNORMAL LOW (ref 3.87–5.11)
RDW: 16.3 % — ABNORMAL HIGH (ref 11.5–15.5)
WBC: 5.3 10*3/uL (ref 4.0–10.5)
nRBC: 0 % (ref 0.0–0.2)

## 2023-05-29 LAB — GLUCOSE, CAPILLARY
Glucose-Capillary: 89 mg/dL (ref 70–99)
Glucose-Capillary: 100 mg/dL — ABNORMAL HIGH (ref 70–99)
Glucose-Capillary: 130 mg/dL — ABNORMAL HIGH (ref 70–99)
Glucose-Capillary: 110 mg/dL — ABNORMAL HIGH (ref 70–99)

## 2023-05-29 LAB — PHOSPHORUS: Phosphorus: 3.4 mg/dL (ref 2.5–4.6)

## 2023-05-29 MED ORDER — INSULIN ASPART 100 UNIT/ML IJ SOLN
0.0000 [IU] | Freq: Three times a day (TID) | INTRAMUSCULAR | Status: DC
Start: 2023-05-29 — End: 2023-06-08
  Administered 2023-05-30: 3 [IU] via SUBCUTANEOUS
  Administered 2023-05-31 – 2023-06-01 (×2): 1 [IU] via SUBCUTANEOUS
  Filled 2023-05-29 (×6): qty 1

## 2023-05-29 MED ORDER — SODIUM CHLORIDE 0.9 % IV SOLN: INTRAVENOUS | Status: AC

## 2023-05-29 MED ORDER — OLANZAPINE 5 MG PO TBDP
10.0000 mg | ORAL_TABLET | Freq: Every day | ORAL | Status: DC
  Administered 2023-05-30 – 2023-06-05 (×6): 10 mg via ORAL
  Filled 2023-05-29 (×10): qty 2

## 2023-05-29 NOTE — Plan of Care (Signed)

## 2023-05-29 NOTE — Progress Notes (Signed)
Pt took 3 small bites of dinner (meatloaf and potato) and 2 sips of water. Refused all meds and food after that.

## 2023-05-29 NOTE — Progress Notes (Signed)
Hospitalist and Psych notified that pt not eating, drinking today and refusing all meds. Orders received to start IVF.   I did just now get her to take 2 sips of water even though she didn't want to but she adamantly refused anything more than that. Tried to get her to drink more and she absolutely would not.

## 2023-05-29 NOTE — Progress Notes (Signed)
The patient has agreed to take her medications during the shift. When pulled and offered to her she refused them. She refused turns during the shift as well.

## 2023-05-29 NOTE — Care Management Important Message (Signed)
Important Message  Patient Details  Name: LASHUNA GUNNARSON MRN: [id number] Date of Birth: [birth year]   Important Message Given:  Yes - Medicare IM     [name] 05/29/2023, 10:25 AM

## 2023-05-29 NOTE — TOC Progression Note (Addendum)
Transition of Care [facility]) - Progression Note    Patient Details  Name: Theresa [name] MRN: [id number] Date of Birth: 02/14/1956  Transition of Care [facility]) CM/SW Contact  Truddie Hidden, RN Phone Number: 05/29/2023, 4:16 PM  Clinical Narrative:    No bed offers. Bed search extended.         Expected Discharge Plan and Services                                               Social Determinants of Health (SDOH) Interventions SDOH Screenings   Food Insecurity: Patient Unable To Answer (05/20/2023)  Housing: High Risk (05/20/2023)  Transportation Needs: Patient Unable To Answer (05/20/2023)  Utilities: Patient Unable To Answer (05/20/2023)  Alcohol Screen: Low Risk  (12/17/2022)  Depression (PHQ2-9): Medium Risk (12/31/2022)  Financial Resource Strain: Low Risk  (02/13/2023)   Received from [facility]  Physical Activity: Inactive (12/17/2022)  Social Connections: Socially Isolated (12/17/2022)  Stress: No Stress Concern Present (12/17/2022)  Tobacco Use: Medium Risk (05/21/2023)  Health Literacy: Low Risk  (09/13/2020)   Received from [facility], [facility]    Readmission Risk Interventions     No data to display

## 2023-05-29 NOTE — Progress Notes (Signed)
Nutrition Follow-up  DOCUMENTATION CODES:   Not applicable  INTERVENTION:   -Continue regular diet -Continue MVI with minerals daily -Continue 500 mg vitamin C BID -Continue 220 mg zinc sulfate daily x 14 days -Continue Boost Breeze po TID, each supplement provides 250 kcal and 9 grams of protein  -If aggressive care is warranted, replete vitamin A- 10,000 units vitamin A daily x 60 days -Continue Magic cup TID with meals, each supplement provides 290 kcal and 9 grams of protein  -Continue feeding assistance with meals  -Case dicussed with RN and MD; palliative care has been consulted for goals of care. If aggressive care is warranted, consider initiation of enteral nutrition support:  Initiate Osmolite 1.5 @ 20 ml/hr and increase by 10 ml every 8 hours to goal rate of 50 ml/hr.   60 ml Prosource TF BID  If no IVFs, 140 ml free water flush every 4 hours    Tube feeding regimen provides 1880 kcal (100% of needs), 95 grams of protein, and 918 ml of H2O. Total free water: 1751 ml daily  -If feedings are initiated, monitor Kg, Mg, and Phos and replete as needed secondary to refeeding risk; also add 100 mg thiamine x 7 days and MVI daily  NUTRITION DIAGNOSIS:   Increased nutrient needs related to wound healing as evidenced by estimated needs.  Ongoing  GOAL:   Patient will meet greater than or equal to 90% of their needs  Unmet  MONITOR:   PO intake, Supplement acceptance  REASON FOR ASSESSMENT:   Consult Assessment of nutrition requirement/status  ASSESSMENT:   Pt with medical history significant of   Anemia of chronic disease, COPD  coronary artery disease status post stent placement, Diabetes mellitus with diabetic neuropathy and retinopathy, previous history of ESRD on hemodialysis currently status post transplant, hyperlipidemia, Hypertension moderate to severe mitral regurgitation, peripheral vascular disease who is a resident in a group home brought in on account  of worsening gangrene noted on the right foot.  12/11- s/p Procedure(s): Lower Extremity Angiography (Right) 12/12- s/p rt AKA  Reviewed I/O's: -195 ml x 24 hours and -5.5 L since admission  UOP: 675 ml x 24 hours   Noted pt was active with hospice PTA. Per MD notes, likely plan to d/c to SNF once medically stable.   Pt lying in bed at time of visit, sleeping soundly. She did not respond to voice or touch. Noted meal tray at bedside, unattempted. Also two unopened Boost Breeze supplements on tray table.    Case discussed with RN and MD. Pt continues to refuse PO's and medications. Plan to start IV fluids. RD discussed concern over prolonged poor oral intake and recommended starting enteral nutrition support if aligns with goals of care. Palliative care consult has been placed to discuss goals of care.   No new wt since last visit.   Medications reviewed and include vitamin C, vitamin B-12, lovenox, folic acid, protonix,vitamin D, zinc sulfate, and 0.9% sodium chloride infusion @ 75 ml/hr.   Labs reviewed: CBGS: 45-204 (inpatient orders for glycemic control are 0-5 units insulin aspart daily at bedtime and 0-6 insulin aspart TID with meals). Vitamin A: 9.2.  Diet Order:   Diet Order             Diet regular Room service appropriate? Yes; Fluid consistency: Thin  Diet effective now                   EDUCATION NEEDS:   Education needs  have been addressed  Skin:  Skin Assessment: Skin Integrity Issues: Skin Integrity Issues:: Incisions, Stage III, Unstageable Stage III: sacrum Unstageable: lt lateral buttock x 2 Incisions: s/p rt AKA on 05/21/23  Last BM:  05/28/23 (type 5)  Height:   Ht Readings from Last 1 Encounters:  05/21/23 5\' 7"  (1.702 m)    Weight:   Wt Readings from Last 1 Encounters:  05/21/23 65 kg    Ideal Body Weight:  56.5 kg (adjusted for rt AKA)  BMI:  Body mass index is 22.44 kg/m.  Estimated Nutritional Needs:   Kcal:   1700-1900  Protein:  90-105 grams  Fluid:  > 1.7 L    Theresa [name], RD, LDN, CDCES Registered Dietitian III Certified Diabetes Care and Education Specialist If unable to reach this RD, please use "RD Inpatient" group chat on secure chat between hours of 8am-4 pm daily

## 2023-05-29 NOTE — Progress Notes (Signed)
Progress Note   Patient: Theresa [name] DOB: 05-Jun-1956 DOA: 05/19/2023     10 DOS: the patient was seen and examined on 05/29/2023   Brief hospital course: HPI on admission: "Theresa [name] is a 67 y.o. female with medical history significant of  Anemia of chronic disease, COPD coronary artery disease status post stent placement, Diabetes mellitus with diabetic neuropathy and retinopathy, previous history of ESRD on hemodialysis currently status post transplant, hyperlipidemia, Hypertension moderate to severe mitral regurgitation, peripheral vascular disease who is a resident in a group home brought in on account of worsening gangrene noted on the right foot.  ..." See H&P for full HPI on admission & ED course.  Patient was admitted to the hospital, started on IV heparin and Vascular Surgery was consulted, plan for angiogram today (12/11).  Further hospital course and management as outlined below.   Assessment and Plan:  Right foot gangrene, s/p AKA done on 12/12 No signs of infection identified, no antibiotics indicated at this time --s/p IV heparin drip --Vascular surgery following, Dr. Wyn Quaker --12/11 -- underwent angiogram with angioplasty to right SFA and popliteal arteries --12/12 -- underwent Right AKA --Initial dressing change by vascular this weekend --Started on Plavix 75 mg daily --Lovenox for DVT ppx started - dc given dropping platelets --OOB to chair --PT/OT evaluations -- return to SNF anticipated 12/17 dressing was changed by vascular surgery, no complications --Follow up Vascular's recommendations  Thrombocytopenia -- appears acute on chronic. Worsening since admission, platelets now in 70's --Hold off Lovenox --Also on Plavix - continue for now --Check HIT Ab --Daily CBC's to monitor Plts 88 stable  Hypokalemia - resolved with replacement --Monitor BMP, replace K PRN  Hyperkalemia - K 5.8 on 12/12 treated and resolved --Monitor BMP    Hypovolemic hyponatremia - mild, Na 132 on admission.   Pt appeared clinically dehydrated. --Encourage oral hydration --Monitor BMP   Anemia of chronic disease - Hbg stable Folic acid level 6.5, at lower end and B12 level 387, goal >400. Started folic acid 1 mg p.o. daily and B12 500 mcg p.o. daily --Monitor CBC   COPD - stable, not exacerbated --PRN bronchodilators   Coronary artery disease status post stent placement --Continue statin therapy   Diabetes mellitus with diabetic neuropathy and retinopathy --Monitor CBG's --Sliding scale Novolog --Continue Lyrica  Hypoglycemia - CBG noted 44 on evening of 12/13.  Treated an improved to 175 --Hypoglycemia protocol   History of ESRD  Patient used to be on on hemodialysis currently status post transplant No longer on hemodialysis Renal function stable at baseline --On tacrolimus and azathioprine --Nephrology following   Severe hypoalbuminemia: Continue protein supplement Dietitian consulted   Hyperlipidemia: Continue Lipitor  Hypertension: --On clonidone and Imdur --Increased clonidine 0.1 mg from daily to BID for improved BP control 12/16 started losartan 25 mg p.o. daily and Lopressor 12.5 mg p.o. twice daily 12/17 increased Imdur 30 to 60 mg p.o. daily, increased Lopressor 25 mg p.o. twice daily --Monitor & adjust regimen as needed  Vitamin D insufficiency: started vitamin D 50,000 units p.o. weekly, follow with PCP to repeat vitamin D level after 3 to 6 months.  Moderate to severe mitral regurgitation --Monitor volume status  Peripheral vascular disease, now s/p R AKA --Started on Plavix per vascular   Goals of care discussion, palliative care consulted on 12/20 Overall poor prognosis due to above comorbidities   Body mass index is 22.44 kg/m. DVT prophylaxis: SCD due to thrombocytopenia, Plts improved Lenoxaparin (LOVENOX)  injection 40 mg Start: 05/28/23 2200    Nutrition Documentation    Flowsheet Row  ED to Hosp-Admission (Current) from [facility]/19/[facility] in [facility]  Nutrition Problem Increased nutrient needs  Etiology wound healing  Nutrition Goal Patient will meet greater than or equal to 90% of their needs  Interventions MVI, Liberalize Diet, Boost Breeze, Prostat     ,  Active Pressure Injury/Wound(s)     Pressure Ulcer  Duration          Pressure Injury 05/20/23 Sacrum Mid Stage 3 -  Full thickness tissue loss. Subcutaneous fat may be visible but bone, tendon or muscle are NOT exposed. 2.8 cm x 4 cm  x 0.5 cm depth (under piece of stringy slough) 6 days   Pressure Injury 05/21/23 Buttocks Left;Lateral Unstageable - Full thickness tissue loss in which the base of the injury is covered by slough (yellow, tan, gray, green or brown) and/or eschar (tan, brown or black) in the wound bed. 1 cm x 1 cm 80% yellow  5 days   Pressure Injury 05/24/23 Heel Left Unstageable - Full thickness tissue loss in which the base of the injury is covered by slough (yellow, tan, gray, green or brown) and/or eschar (tan, brown or black) in the wound bed. mushy texture to the touch, white/b 1 day              Subjective: No significant events overnight, patient was sleepy, seems to be resting comfortably, did not wake up and communicate. As per RN patient has decreased oral intake and not eating and drinking much. Started IV fluid.  Palliative care consulted.  Physical Exam: Vitals:   05/29/23 0448 05/29/23 0453 05/29/23 0850 05/29/23 1704  BP: (!) 144/48 (!) 144/48 (!) 133/47 (!) 190/55  Pulse: 64 63 65 72  Resp: 18  20 20   Temp: 99.1 F (37.3 C)  99 F (37.2 C) 98.4 F (36.9 C)  TempSrc: Oral   Oral  SpO2: 99% 100% 98% 100%  Weight:      Height:       General exam: sleepy, no acute distress, chronically ill appearing HEENT: mucus membranes dry, hearing grossly normal  Respiratory system: lungs clear no wheezes or rhonchi, on room air, normal respiratory  effort. Cardiovascular system: normal S1/S2, RRR, no pedal edema.   Gastrointestinal system: soft, NT, ND Central nervous system: A&O x. no gross focal neurologic deficits, normal speech Extremities: s/p right AKA, dressing CDI Psychiatry: mood depressed, affect sad face and sleepy   Data Reviewed:  Notable labs --     Na 136>>134 BUN 24>>21 Cr 1.40 >> 1.47 >> 1.41 >> 1.26>>1.18>>1.09 Ca 8.5 Hbg 9.3>8.4 Platelets 103 >>> 74>>102k   Family Communication: None present on rounds.    Disposition: Status is: Inpatient Remains inpatient appropriate because: needs SNF placement. cleared by vascular surgery  Planned Discharge Destination: SNF placement when bed will be available.  enoxaparin (LOVENOX) injection 40 mg Start: 05/28/23 2200 Time spent: 40 minutes  Author: Gillis Santa, MD 05/29/2023 5:14 PM  For on call review [email or web address].

## 2023-05-30 DIAGNOSIS — I7090 Unspecified atherosclerosis: Secondary | ICD-10-CM | POA: Diagnosis not present

## 2023-05-30 DIAGNOSIS — E11621 Type 2 diabetes mellitus with foot ulcer: Secondary | ICD-10-CM | POA: Diagnosis not present

## 2023-05-30 DIAGNOSIS — L97509 Non-pressure chronic ulcer of other part of unspecified foot with unspecified severity: Secondary | ICD-10-CM | POA: Diagnosis not present

## 2023-05-30 DIAGNOSIS — Z515 Encounter for palliative care: Secondary | ICD-10-CM

## 2023-05-30 DIAGNOSIS — E1152 Type 2 diabetes mellitus with diabetic peripheral angiopathy with gangrene: Secondary | ICD-10-CM | POA: Diagnosis not present

## 2023-05-30 LAB — BASIC METABOLIC PANEL
Anion gap: 6 (ref 5–15)
BUN: 21 mg/dL (ref 8–23)
CO2: 21 mmol/L — ABNORMAL LOW (ref 22–32)
Calcium: 8 mg/dL — ABNORMAL LOW (ref 8.9–10.3)
Chloride: 108 mmol/L (ref 98–111)
Creatinine, Ser: 1.34 mg/dL — ABNORMAL HIGH (ref 0.44–1.00)
GFR, Estimated: 44 mL/min — ABNORMAL LOW (ref >60)
Glucose, Bld: 90 mg/dL (ref 70–99)
Potassium: 4.9 mmol/L (ref 3.5–5.1)
Sodium: 135 mmol/L (ref 135–145)

## 2023-05-30 LAB — CBC
HCT: 22.9 % — ABNORMAL LOW (ref 36.0–46.0)
Hemoglobin: 7.6 g/dL — ABNORMAL LOW (ref 12.0–15.0)
MCH: 33 pg (ref 26.0–34.0)
MCHC: 33.2 g/dL (ref 30.0–36.0)
MCV: 99.6 fL (ref 80.0–100.0)
Platelets: 142 10*3/uL — ABNORMAL LOW (ref 150–400)
RBC: 2.3 MIL/uL — ABNORMAL LOW (ref 3.87–5.11)
RDW: 16.5 % — ABNORMAL HIGH (ref 11.5–15.5)
WBC: 6 10*3/uL (ref 4.0–10.5)
nRBC: 0 % (ref 0.0–0.2)

## 2023-05-30 LAB — GLUCOSE, CAPILLARY
Glucose-Capillary: 86 mg/dL (ref 70–99)
Glucose-Capillary: 293 mg/dL — ABNORMAL HIGH (ref 70–99)
Glucose-Capillary: 97 mg/dL (ref 70–99)
Glucose-Capillary: 83 mg/dL (ref 70–99)

## 2023-05-30 MED ORDER — ACETAMINOPHEN 325 MG PO TABS
650.0000 mg | ORAL_TABLET | Freq: Three times a day (TID) | ORAL | Status: DC
  Administered 2023-05-30 – 2023-06-07 (×12): 650 mg via ORAL
  Filled 2023-05-30 (×16): qty 2

## 2023-05-30 MED ORDER — OXYCODONE HCL 5 MG PO TABS
5.0000 mg | ORAL_TABLET | Freq: Four times a day (QID) | ORAL | Status: DC | PRN
  Administered 2023-05-31: 5 mg via ORAL
  Administered 2023-05-31: 10 mg via ORAL
  Administered 2023-06-05: 5 mg via ORAL
  Filled 2023-05-30 (×3): qty 1
  Filled 2023-05-30: qty 2

## 2023-05-30 NOTE — Progress Notes (Addendum)
Progress Note   Patient: Theresa [name] VZD:[id number] DOB: 1955-09-24 DOA: 05/19/2023     11 DOS: the patient was seen and examined on 05/30/2023   Brief hospital course: HPI on admission: "Theresa [name] is a 67 y.o. female with medical history significant of  Anemia of chronic disease, COPD coronary artery disease status post stent placement, Diabetes mellitus with diabetic neuropathy and retinopathy, previous history of ESRD on hemodialysis currently status post transplant, hyperlipidemia, Hypertension moderate to severe mitral regurgitation, peripheral vascular disease who is a resident in a group home brought in on account of worsening gangrene noted on the right foot.  ..." See H&P for full HPI on admission & ED course.  Patient was admitted to the hospital, started on IV heparin and Vascular Surgery was consulted, plan for angiogram today (12/11).  Further hospital course and management as outlined below.   Assessment and Plan:  Right foot gangrene, s/p AKA done on 12/12 No signs of infection identified, no antibiotics indicated at this time --s/p IV heparin drip --Vascular surgery following, Dr. Wyn Quaker --12/11 -- underwent angiogram with angioplasty to right SFA and popliteal arteries --12/12 -- underwent Right AKA --Initial dressing change by vascular this weekend --Started on Plavix 75 mg daily --Lovenox for DVT ppx started -held for few days due to low platelet count, then resumed.   --OOB to chair --PT/OT evaluations -- return to SNF anticipated 12/17 dressing was changed by vascular surgery, no complications --Follow up Vascular's recommendations  Thrombocytopenia -- appears acute on chronic. Worsening since admission, platelets now in 70's --held Lovenox and resumed after improvement in the platelet count --Also on Plavix - continue for now --Daily CBC's to monitor Plts 142 stable  Hypokalemia - resolved with replacement --Monitor BMP, replace K PRN  Hyperkalemia  - K 5.8 on 12/12 treated and resolved --Monitor BMP   Hypovolemic hyponatremia - mild, Na 132 on admission.   Pt appeared clinically dehydrated. --Encourage oral hydration --Monitor BMP   Anemia of chronic disease - Hbg stable Folic acid level 6.5, at lower end and B12 level 387, goal >400. Started folic acid 1 mg p.o. daily and B12 500 mcg p.o. daily 12/21 Hb 7.6 --Monitor CBC   COPD - stable, not exacerbated --PRN bronchodilators   Coronary artery disease status post stent placement --Continue statin therapy   Diabetes mellitus with diabetic neuropathy and retinopathy --Monitor CBG's --Sliding scale Novolog --Continue Lyrica  Hypoglycemia - CBG noted 44 on evening of 12/13.  Treated an improved to 175 --Hypoglycemia protocol   History of ESRD  Patient used to be on on hemodialysis currently status post transplant No longer on hemodialysis Renal function stable at baseline --On tacrolimus and azathioprine --Nephrology following   Severe hypoalbuminemia: Continue protein supplement Dietitian consulted   Hyperlipidemia: Continue Lipitor  Hypertension: --On clonidone and Imdur --Increased clonidine 0.1 mg from daily to BID for improved BP control 12/16 started losartan 25 mg p.o. daily and Lopressor 12.5 mg p.o. twice daily 12/17 increased Imdur 30 to 60 mg p.o. daily, increased Lopressor 25 mg p.o. twice daily --Monitor & adjust regimen as needed  Vitamin D insufficiency: started vitamin D 50,000 units p.o. weekly, follow with PCP to repeat vitamin D level after 3 to 6 months.  Moderate to severe mitral regurgitation --Monitor volume status  Peripheral vascular disease, now s/p R AKA --Started on Plavix per vascular   Goals of care discussion, palliative care consulted on 12/20 Overall poor prognosis due to above comorbidities  Psych consulted  to evaluate for decision-making capacity. 12/21 follow psych consult  Body mass index is 22.44 kg/m. DVT  prophylaxis: SCD due to thrombocytopenia, Plts improved Lenoxaparin (LOVENOX) injection 40 mg Start: 05/28/23 2200    Nutrition Documentation    Flowsheet Row ED to Hosp-Admission (Current) from 05/19/2023 in [facility]  Nutrition Problem Increased nutrient needs  Etiology wound healing  Nutrition Goal Patient will meet greater than or equal to 90% of their needs  Interventions MVI, Liberalize Diet, Boost Breeze, Prostat     ,  Active Pressure Injury/Wound(s)     Pressure Ulcer  Duration          Pressure Injury 05/20/23 Sacrum Mid Stage 3 -  Full thickness tissue loss. Subcutaneous fat may be visible but bone, tendon or muscle are NOT exposed. 2.8 cm x 4 cm  x 0.5 cm depth (under piece of stringy slough) 6 days   Pressure Injury 05/21/23 Buttocks Left;Lateral Unstageable - Full thickness tissue loss in which the base of the injury is covered by slough (yellow, tan, gray, green or brown) and/or eschar (tan, brown or black) in the wound bed. 1 cm x 1 cm 80% yellow  5 days   Pressure Injury 05/24/23 Heel Left Unstageable - Full thickness tissue loss in which the base of the injury is covered by slough (yellow, tan, gray, green or brown) and/or eschar (tan, brown or black) in the wound bed. mushy texture to the touch, white/b 1 day              Subjective: No significant events overnight, today patient was more awake and alert, AO x 2, aware of person and place, does not know month and the year.  Patient says that she is in a lot of pain and would like to increase her pain medication.  Patient is trying to eat and drink more water. Denied any other complaints.  Physical Exam: Vitals:   05/29/23 1929 05/29/23 2313 05/30/23 0355 05/30/23 0848  BP: (!) 159/74 (!) 139/59 (!) 149/58 (!) 179/54  Pulse: 77 79 72 85  Resp: 19 18 18 17   Temp: 98.3 F (36.8 C) 98.1 F (36.7 C) 98 F (36.7 C) 98 F (36.7 C)  TempSrc: Oral Oral Oral   SpO2: 98% 99% 98% 100%   Weight:      Height:       General exam: sleepy, no acute distress, chronically ill appearing HEENT: mucus membranes dry, hearing grossly normal  Respiratory system: lungs clear no wheezes or rhonchi, on room air, normal respiratory effort. Cardiovascular system: normal S1/S2, RRR, no pedal edema.   Gastrointestinal system: soft, NT, ND Central nervous system: A&O x. no gross focal neurologic deficits, normal speech Extremities: s/p right AKA, dressing CDI Psychiatry: mood depressed, affect sad face and sleepy   Data Reviewed:  Notable labs --     Na 136>>135 BUN 24>>21 Cr 1.40 >> 1.47 >> 1.41 >> 1.26>>1.18>>1.09>>1.34 Ca 8.0 Hbg 9.3>8.4>>7.6 Platelets 103 >>> 74>>102>>142k   Family Communication: None present on rounds.    Disposition: Status is: Inpatient Remains inpatient appropriate because: needs SNF placement. cleared by vascular surgery  Planned Discharge Destination: SNF placement when bed will be available.  enoxaparin (LOVENOX) injection 40 mg Start: 05/28/23 2200 Time spent: 40 minutes  Author: Gillis Santa, MD 05/30/2023 2:54 PM  For on call review [email or web address].

## 2023-05-30 NOTE — Consult Note (Signed)
Consultation Note Date: 05/30/2023   Patient Name: Theresa [name]  DOB: March 22, 1956  MRN: [id number]  Age / Sex: 67 y.o., female  PCP: Dana Allan, MD Referring Physician: Gillis Santa, MD  Reason for Consultation: Establishing goals of care   HPI/Brief Hospital Course: 67 y.o. female  with past medical history of COPD, previous history of ESRD on HD now s/p renal transplant, anemia of chronic disease, CAD s/p stent placement, HTN, HLD and PVD admitted from group home on 05/19/2023 with worsening foot wound, found to gangrene.  12/11 underwent angiogram with angioplasty to right SFA and popliteal arteries 12/12 underwent right AKA   Palliative medicine was consulted for assisting with goals of care conversations.  Subjective:  Extensive chart review has been completed prior to meeting patient including labs, vital signs, imaging, progress notes, orders, and available advanced directive documents from current and previous encounters.  Visited with Theresa [name] at her bedside. She is awake, alert, able to engage in conversation but irritable and impulsive during visit. From chart review, per psychiatry note Theresa [name] has underlying vascular dementia.  Introduced myself as a Publishing rights manager as a member of the palliative care team. Explained palliative medicine is specialized medical care for people living with serious illness. It focuses on providing relief from the symptoms and stress of a serious illness. The goal is to improve quality of life for both the patient and the family.   Theresa [name] shares a brief life review. She is not currently married. She has a son and daughter. She has been a resident of her group home for many years. Theresa [name] confirms she was under hospice services but recently removed herself from them as "they were annoying."  Theresa [name] repeatedly asking to be discharged back to her home. She shares she does not understand why she remains  in the hospital. Attempted to explain need for close monitoring after recent amputation and process of discharging to SNF. [name] became irritated and repeatedly asked to return home.  Concern for Theresa [name]'s capacity to engage in complex medical decision making. No advanced documents for review under [facility]. Recommend psychiatry evaluation for capacity. Spoke with TOC, if AD not available, may need to consider involving APS to establish legal guardian moving forward. According to [facility], Theresa [name] has a daughter that has asked to be removed from contact list. Complicated family dynamics.  Called and spoke with son-[name], plan to meet at bedside 12/22 at 10:30AM.  Emotional support provided to patient/family/support persons. PMT will continue to follow and support patient as needed.  Objective: Primary Diagnoses: Present on Admission:  Diabetes mellitus with foot ulcer and gangrene (HCC)  Gangrene of right foot (HCC)   Vital Signs: BP (!) 175/54 (BP Location: Right Arm)   Pulse 72   Temp 99.3 F (37.4 C) (Oral)   Resp 17   Ht 5\' 7"  (1.702 m)   Wt 65 kg   SpO2 100%   BMI 22.44 kg/m  Pain Scale: 0-10   Pain Score: 9   IO: Intake/output summary:  Intake/Output Summary (Last 24 hours) at 05/30/2023 1617 Last data filed at 05/30/2023 1212 Gross per 24 hour  Intake 2009.86 ml  Output 100 ml  Net 1909.86 ml    LBM: Last BM Date : 05/28/23 Baseline Weight: Weight: 66.2 kg Most recent weight: Weight: 65 kg      Assessment and Plan  SUMMARY OF RECOMMENDATIONS   Capacity assessment May need to establish legal guardian PMT to continue  to follow for ongoing needs and support  Discussed With: primary team   Thank you for this consult and allowing Palliative Medicine to participate in the care of Theresa Allain. [name]. Palliative medicine will continue to follow and assist as needed.   Time Total: 75 minutes  Time spent includes: Detailed review of medical records (labs, imaging,  vital signs), medically appropriate exam (mental status, respiratory, cardiac, skin), discussed with treatment team, counseling and educating patient, family and staff, documenting clinical information, medication management and coordination of care.   Signed by: Leeanne Deed, DNP, AGNP-C Palliative Medicine    Please contact Palliative Medicine Team phone at [phone] for questions and concerns.  For individual provider: See Loretha Stapler

## 2023-05-30 NOTE — TOC Progression Note (Signed)
Transition of Care [facility]) - Progression Note    Patient Details  Name: Theresa [name] MRN: [id number] Date of Birth: 12-06-1955  Transition of Care [facility]) CM/SW Contact  Bing Quarry, RN Phone Number: 05/30/2023, 1:01 PM  Clinical Narrative:  12/21:Palliative called RN CM discuss issue of competency and potential for AP/LG in association with son's engagement to see what steps to take next. She (NP)will obtain psych request and is to meet with son tomorrow am.    Gabriel Cirri MSN RN CM  Care Management Department.    [facility] Direct Dial: [phone] Main Office Phone: [phone] Weekends Only           Expected Discharge Plan and Services                                               Social Determinants of Health (SDOH) Interventions SDOH Screenings   Food Insecurity: Patient Unable To Answer (05/20/2023)  Housing: High Risk (05/20/2023)  Transportation Needs: Patient Unable To Answer (05/20/2023)  Utilities: Patient Unable To Answer (05/20/2023)  Alcohol Screen: Low Risk  (12/17/2022)  Depression (PHQ2-9): Medium Risk (12/31/2022)  Financial Resource Strain: Low Risk  (02/13/2023)   [facility] from [facility]  Physical Activity: Inactive (12/17/2022)  Social Connections: Socially Isolated (12/17/2022)  Stress: No Stress Concern Present (12/17/2022)  Tobacco Use: Medium Risk (05/21/2023)  Health Literacy: Low Risk  (09/13/2020)   Received from [facility], [facility]    Readmission Risk Interventions     No data to display

## 2023-05-30 NOTE — Progress Notes (Signed)
Patient was minimally responsive at the beginning of the shift. When I offered her pain medication she responded verbally. She is confused and having intermittent hallucinations. She has refused her medications except for pain medication.  She has refused turning to do her sacral wound dressings She allowed me to change her dressing at the groin.

## 2023-05-30 NOTE — Discharge Instructions (Addendum)
Rent/Utility/Housing Resources  Agency Name: [facility] Agency Address: [address] [place], [place] [address] Phone: [phone] Email: [email or web address] Website: www.alamanceservices.org Service(s) Offered: Housing services, self-sufficiency, congregate meal program, weatherization program, Field seismologist program, emergency food assistance,  housing counseling, home ownership program, wheels -towork program.  Agency Name: [facility] Address: [address], [place], [place] [address] Phone: [phone] (8a-4p) [phone] (8p- 10p) Email: [email or web address] Website: [email or web address] Service(s) Offered: A program for homeless and/or needy men that includes one-on-one counseling, life skills training and job rehabilitation.  Agency Name: [facility] Address: [address] [place], [place] [address] Phone: [phone] Website: www.alliedchurches.org Service(s) Offered: Assistance to needy in emergency with utility bills, heating fuel, and prescriptions. Shelter for homeless 7pm-7am. October 02, 2016 15  Agency Name: [facility] [place] [facility] Address: [address], [place], [place] [address] Phone: [phone] Contact Person: Cathleen Corti Email: [email or web address] Website: [email or web address].[email or web address] Service(s) Offered: Helps individuals with developmental disabilities move from housing that is more restrictive to homes where they  can achieve greater independence and have more  opportunities.  Agency Name: [facility] Address: [address], [place], [place] [address] Phone: [phone] Email: [email or web address] Website: www.burlingtonhousingauthority.org Service(s) Offered: Provides affordable housing for low-income families, elderly, and disabled individuals. Offer a wide range of  programs and services, from financial  planning to afterschool and summer programs.  Agency Name: [facility] Address: [address] [place], [place] [address] Phone: [phone] Service(s) Offered: Child support services; child welfare services; food stamps; Medicaid; work first family assistance; and aid with fuel,  rent, food and medicine.  Agency Name: [facility] [place] [employer]. Address: [facility] [address], [place], [place]  [address] Phone: [phone] Website: www.familyabuseservices.org Service(s) Offered: 24 hour Crisis Line: [phone]; 24 hour Emergency Shelter; Transitional Housing; Support Groups; Scientist, physiological; [employer]; Hispanic Outreach: [phone];  Visitation Center: [phone].  Agency Name: [facility] [place]. Address: [address] [place], [place] [address] Phone: [phone] Service(s) Offered: CAP Services; Home and [employer]; Individual or Group Supports; Respite Care Non-Institutional Nursing;  Residential Supports; Respite Care and Personal Care Services; Transportation; Family and Friends Night; Recreational Activities; Three Nutritious Meals/Snacks; Consultation with Registered Dietician; Twenty-four hour Registered Nurse Access; Daily and [employer]; Camp [employer]aves; [place] for the [employer] (During Summer Months) Bingo Night (Every  Wednesday Night); Special Populations Dance Night  (Every Tuesday Night); Professional Hair Care Services.  Agency Name: [facility] Address: [address], [place], [place] [address] Phone: [phone] Contact Person: Jabier Mutton Website: [email or web address].Physicist, medical) Offered: Residential treatment facility for women; food and  clothing, educational & employment development and  transportation to work; Counsellor of financial skills;  parenting and family reunification; emotional and spiritual   support; transitional housing for program graduates.  Agency Name: [facility] Address: [address] [place], [place] [address] Phone: [phone] Email: [email or web address] Website: TaskTown.es Service(s) Offered: Public housing units for elderly, disabled, and low income people; housing choice vouchers for income eligible  applicants; shelter plus care vouchers; and Psychologist, clinical.  Agency Name: [facility] [employer] Address: [address], [place], [place] [address] Phone: [phone] Email: [email or web address]@[email or web address] Website: www.habitatalamance.org Service(s) Offered: Build houses for families in need of decent housing. Each adult in the family must invest 200 hours of labor on  someone else's house, work with volunteers to build their own house, attend  classes on budgeting, home maintenance, yard care, and attend homeowner association meetings.  Agency Name: [facility] Address: [address], [place], [place] [address] Phone: [phone] Website: www.rsli.org Service(s) Offered: Intermediate care facilities for intellectually delayed, Supervised Living in group homes for adults with developmental disabilities, Supervised Living for people who have dual diagnoses (MRMI), Independent Living, Supported Living, respite and a variety of CAP services, pre-vocational services, day supports, and [employer].  Agency Name: [place] [employer] Phone: [phone] Website: www.NCForeclosurePrevention.gov Service(s) Offered: Zero-interest, deferred loans to homeowners struggling to pay their mortgage. Call for more information.

## 2023-05-30 NOTE — Progress Notes (Signed)
[facility]  ROUNDING NOTE   Subjective:   Patient alert and oriented to self only Believes she is home and has already completed rehab Denies pain  Creatinine 1.34  Objective:  Vital signs in last 24 hours:  Temp:  [98 F (36.7 C)-98.4 F (36.9 C)] 98 F (36.7 C) (12/21 0848) Pulse Rate:  [72-85] 85 (12/21 0848) Resp:  [17-20] 17 (12/21 0848) BP: (139-190)/(54-74) 179/54 (12/21 0848) SpO2:  [98 %-100 %] 100 % (12/21 0848)  Weight change:  Filed Weights   05/19/23 1643 05/20/23 1234 05/21/23 1019  Weight: 66.2 kg 65 kg 65 kg    Intake/Output: I/O last 3 completed shifts: In: 2068.5 [P.O.:960; I.V.:1108.5] Out: 175 [Urine:175]   Intake/Output this shift:  No intake/output data recorded.  Physical Exam: General: NAD, laying in bed  Head: Normocephalic, atraumatic. Moist oral mucosal membranes  Eyes: Anicteric  Lungs:  Clear to auscultation, normal effort  Heart: Regular rate and rhythm  Abdomen:  Soft, nontender  Extremities: No peripheral edema.  Rt AKA on 05/21/23. Dressing clean, dry and intact  Neurologic: Alert, oriented to self  Skin: No lesions  Access: None    Basic Metabolic Panel: Recent Labs  Lab 05/26/23 1126 05/27/23 0538 05/28/23 0623 05/29/23 0627 05/30/23 0434  NA 133* 136 134* 137 135  K 4.6 4.9 4.5 4.7 4.9  CL 105 106 105 107 108  CO2 21* 23 21* 23 21*  GLUCOSE 355* 96 98 94 90  BUN 22 21 19 22 21   CREATININE 1.24* 1.18* 1.09* 1.35* 1.34*  CALCIUM 8.1* 8.5* 8.4* 8.2* 8.0*  MG 1.9 2.1 2.1 2.1  --   PHOS 2.9 2.7 3.2 3.4  --     Liver Function Tests: No results for input(s): "AST", "ALT", "ALKPHOS", "BILITOT", "PROT", "ALBUMIN" in the last 168 hours.  No results for input(s): "LIPASE", "AMYLASE" in the last 168 hours. No results for input(s): "AMMONIA" in the last 168 hours.  CBC: Recent Labs  Lab 05/26/23 1126 05/27/23 0538 05/28/23 0623 05/29/23 0627 05/30/23 0434  WBC 6.7 6.1 5.3 5.3 6.0  HGB 8.6* 8.4*  8.4* 8.3* 7.6*  HCT 26.6* 26.7* 26.2* 25.9* 22.9*  MCV 100.4* 102.3* 100.4* 102.0* 99.6  PLT 88* 94* 102* 117* 142*    Cardiac Enzymes: No results for input(s): "CKTOTAL", "CKMB", "CKMBINDEX", "TROPONINI" in the last 168 hours.  BNP: Invalid input(s): "POCBNP"  CBG: Recent Labs  Lab 05/29/23 0445 05/29/23 1214 05/29/23 1658 05/29/23 2100 05/30/23 0849  GLUCAP 89 100* 130* 110* 86    Microbiology: Results for orders placed or performed during the hospital encounter of 05/19/23  Surgical pcr screen     Status: None   Collection Time: 05/21/23  3:34 AM   Specimen: Nasal Mucosa; Nasal Swab  Result Value Ref Range Status   MRSA, PCR NEGATIVE NEGATIVE Final   Staphylococcus aureus NEGATIVE NEGATIVE Final    Comment: (NOTE) The Xpert SA Assay (FDA approved for NASAL specimens in patients 56 years of age and older), is one component of a comprehensive surveillance program. It is not intended to diagnose infection nor to guide or monitor treatment. Performed at [facility], [address] [place], [place] [address]     Coagulation Studies: No results for input(s): "LABPROT", "INR" in the last 72 hours.   Urinalysis: No results for input(s): "COLORURINE", "LABSPEC", "PHURINE", "GLUCOSEU", "HGBUR", "BILIRUBINUR", "KETONESUR", "PROTEINUR", "UROBILINOGEN", "NITRITE", "LEUKOCYTESUR" in the last 72 hours.  Invalid input(s): "APPERANCEUR"    Imaging: No results found.  Medications:    sodium chloride 75 mL/hr at 05/30/23 0400    vitamin C  500 mg Oral BID   atorvastatin  20 mg Oral Daily   azaTHIOprine  100 mg Oral Daily   Chlorhexidine Gluconate Cloth  6 each Topical Daily   cloNIDine  0.1 mg Oral BID   clopidogrel  75 mg Oral Daily   vitamin B-12  500 mcg Oral Daily   enoxaparin (LOVENOX) injection  40 mg Subcutaneous Q24H   feeding supplement  1 Container Oral TID BM   folic acid  1 mg Oral Daily   insulin aspart  0-5 Units Subcutaneous QHS    insulin aspart  0-6 Units Subcutaneous TID WC   isosorbide mononitrate  60 mg Oral Daily   leptospermum manuka honey  1 Application Topical Daily   losartan  25 mg Oral Daily   metoprolol tartrate  50 mg Oral BID   multivitamin with minerals  1 tablet Oral Daily   OLANZapine zydis  10 mg Oral QHS   pantoprazole  40 mg Oral Daily   pregabalin  75 mg Oral BID   tacrolimus ER  10 mg Oral QAC breakfast   traZODone  150 mg Oral QHS   Vitamin D (Ergocalciferol)  50,000 Units Oral Q7 days   zinc sulfate (50mg  elemental zinc)  220 mg Oral Daily   haloperidol lactate, hydrALAZINE, hydrALAZINE, ipratropium-albuterol, morphine injection, mouth rinse, oxyCODONE  Assessment/ Plan:  Theresa [name] is a 67 y.o.  female with past medical conditions including COPD, anemia, diabetes, CAD, hyperlipidemia, status post renal transplant, hypertension, who was admitted to [facility] on 05/19/2023 for Arterial occlusion [I70.90] Diabetes mellitus with foot ulcer and gangrene (HCC) [N82.956, L97.509, E11.52] Gangrene of right foot (HCC) [I96]   Chronic kidney disease stage IIIb, status post cadaver renal transplant completed at [facility] on 03/30/2019.  Baseline creatinine 1-1.5.   - Immunosuppressive medications: tacrolimus and azothiaprine.  - no indication for tacrolimus level at this time.  - Creatinine at baseline   Lab Results  Component Value Date   CREATININE 1.34 (H) 05/30/2023   CREATININE 1.35 (H) 05/29/2023   CREATININE 1.09 (H) 05/28/2023    Intake/Output Summary (Last 24 hours) at 05/30/2023 1126 Last data filed at 05/30/2023 0400 Gross per 24 hour  Intake 1588.52 ml  Output 100 ml  Net 1488.52 ml     2.  Hypertension with chronic kidney disease.  Home regimen includes furosemide, clonidine, and isosorbide.  Furosemide currently held.  Blood pressure elevated today, 179/54  3. Anemia of chronic kidney disease Lab Results  Component Value Date   HGB 7.6 (L) 05/30/2023   Hgb below  desired range. Will monitor   LOS: 11 Theresa [name] 12/21/202411:26 AM

## 2023-05-30 NOTE — Progress Notes (Signed)
Physical Therapy Treatment Patient Details Name: Theresa [name] MRN: [id number] DOB: 1955/09/29 Today's Date: 05/30/2023   History of Present Illness Pt is s/p R AKA 2/2 R foot and lower leg gangrene. PMH of CAD s/p stent, HTN, COPD, MI, CHF, dementia, DMII, previous ESRD now s/p transplant, HLD, PVD.    PT Comments  Awake and agrees to participate.  She get to sitting with min a x 1 and cues for hand placements.  She is generally steady in sitting with 1 UE support but does refuse to get fully to EOB so foot can be placed on floor for balance.  She sits about 5 minutes before self initiating return to supine.  Rolling left/right with min a x 1 and rails.  Pulls herself up in bed with mod a x 1 and BUE's to assist.  Agitation increases towards end of session but overall better participation today.  Cognition does seem to be primary barrier for progression of mobility.  Of note, sacral dressing was off/rolled up her back.  Notified RN.     If plan is discharge home, recommend the following: Two people to help with bathing/dressing/bathroom;Two people to help with walking and/or transfers;Direct supervision/assist for medications management;Help with stairs or ramp for entrance;Assist for transportation;Assistance with cooking/housework;Assistance with feeding;Direct supervision/assist for financial management;Supervision due to cognitive status   Can travel by private vehicle        Equipment Recommendations       Recommendations for Other Services       Precautions / Restrictions Precautions Precautions: Fall Restrictions Weight Bearing Restrictions Per Provider Order: Yes RLE Weight Bearing Per Provider Order: Non weight bearing Other Position/Activity Restrictions: R AKA     Mobility  Bed Mobility Overal bed mobility: Needs Assistance Bed Mobility: Supine to Sit, Sit to Supine     Supine to sit: Min assist, Mod assist Sit to supine: Min assist   General bed mobility comments:  motivated today and participated in care Patient Response: Cooperative  Transfers                   General transfer comment: unsafe to attempt    Ambulation/Gait                   Stairs             Wheelchair Mobility     Tilt Bed Tilt Bed Patient Response: Cooperative  Modified Rankin (Stroke Patients Only)       Balance Overall balance assessment: Needs assistance Sitting-balance support: Single extremity supported Sitting balance-Leahy Scale: Good Sitting balance - Comments: refused to get to EOB to put foot on floor due to fear of falling but does sit well with 1 UE support without LOB fora bout 5 mintues                                    Cognition Arousal: Alert Behavior During Therapy: WFL for tasks assessed/performed, Agitated Overall Cognitive Status: History of cognitive impairments - at baseline                                 General Comments: more cooperative today        Exercises      General Comments        Pertinent Vitals/Pain Pain Assessment Pain Assessment: Faces Faces Pain  Scale: Hurts little more Pain Location: R AKA site Pain Descriptors / Indicators: Grimacing, Moaning, Guarding Pain Intervention(s): Limited activity within patient's tolerance, Monitored during session, Repositioned    Home Living                          Prior Function            PT Goals (current goals can now be found in the care plan section) Progress towards PT goals: Progressing toward goals    Frequency    Min 1X/week      PT Plan      Co-evaluation              AM-PAC PT "6 Clicks" Mobility   Outcome Measure  Help needed turning from your back to your side while in a flat bed without using bedrails?: A Little Help needed moving from lying on your back to sitting on the side of a flat bed without using bedrails?: A Little Help needed moving to and from a bed to a chair  (including a wheelchair)?: Total Help needed standing up from a chair using your arms (e.g., wheelchair or bedside chair)?: Total Help needed to walk in hospital room?: Total Help needed climbing 3-5 steps with a railing? : Total 6 Click Score: 10    End of Session   Activity Tolerance: Patient tolerated treatment well Patient left: in bed;with call bell/phone within reach;with bed alarm set Nurse Communication: Mobility status PT Visit Diagnosis: Other abnormalities of gait and mobility (R26.89);Difficulty in walking, not elsewhere classified (R26.2);Muscle weakness (generalized) (M62.81);Pain Pain - Right/Left: Right Pain - part of body: Leg     Time: 1145-1158 PT Time Calculation (min) (ACUTE ONLY): 13 min  Charges:    $Therapeutic Activity: 8-22 mins PT General Charges $$ ACUTE PT VISIT: 1 Visit                   Danielle Dess, PTA 05/30/23, 12:50 PM

## 2023-05-31 DIAGNOSIS — Z515 Encounter for palliative care: Secondary | ICD-10-CM

## 2023-05-31 DIAGNOSIS — E11621 Type 2 diabetes mellitus with foot ulcer: Secondary | ICD-10-CM | POA: Diagnosis not present

## 2023-05-31 DIAGNOSIS — L97509 Non-pressure chronic ulcer of other part of unspecified foot with unspecified severity: Secondary | ICD-10-CM | POA: Diagnosis not present

## 2023-05-31 DIAGNOSIS — E1152 Type 2 diabetes mellitus with diabetic peripheral angiopathy with gangrene: Secondary | ICD-10-CM | POA: Diagnosis not present

## 2023-05-31 DIAGNOSIS — I7090 Unspecified atherosclerosis: Secondary | ICD-10-CM

## 2023-05-31 LAB — GLUCOSE, CAPILLARY
Glucose-Capillary: 160 mg/dL — ABNORMAL HIGH (ref 70–99)
Glucose-Capillary: 176 mg/dL — ABNORMAL HIGH (ref 70–99)
Glucose-Capillary: 116 mg/dL — ABNORMAL HIGH (ref 70–99)
Glucose-Capillary: 97 mg/dL (ref 70–99)

## 2023-05-31 LAB — CBC
HCT: 22.3 % — ABNORMAL LOW (ref 36.0–46.0)
Hemoglobin: 7.2 g/dL — ABNORMAL LOW (ref 12.0–15.0)
MCH: 32.9 pg (ref 26.0–34.0)
MCHC: 32.3 g/dL (ref 30.0–36.0)
MCV: 101.8 fL — ABNORMAL HIGH (ref 80.0–100.0)
Platelets: 139 10*3/uL — ABNORMAL LOW (ref 150–400)
RBC: 2.19 MIL/uL — ABNORMAL LOW (ref 3.87–5.11)
RDW: 16.8 % — ABNORMAL HIGH (ref 11.5–15.5)
WBC: 5.2 10*3/uL (ref 4.0–10.5)
nRBC: 0 % (ref 0.0–0.2)

## 2023-05-31 LAB — BASIC METABOLIC PANEL
Anion gap: 6 (ref 5–15)
BUN: 22 mg/dL (ref 8–23)
CO2: 20 mmol/L — ABNORMAL LOW (ref 22–32)
Calcium: 8.2 mg/dL — ABNORMAL LOW (ref 8.9–10.3)
Chloride: 110 mmol/L (ref 98–111)
Creatinine, Ser: 1.38 mg/dL — ABNORMAL HIGH (ref 0.44–1.00)
GFR, Estimated: 42 mL/min — ABNORMAL LOW (ref >60)
Glucose, Bld: 92 mg/dL (ref 70–99)
Potassium: 4.5 mmol/L (ref 3.5–5.1)
Sodium: 136 mmol/L (ref 135–145)

## 2023-05-31 LAB — HEMOGLOBIN AND HEMATOCRIT, BLOOD
HCT: 26 % — ABNORMAL LOW (ref 36.0–46.0)
Hemoglobin: 8.3 g/dL — ABNORMAL LOW (ref 12.0–15.0)

## 2023-05-31 LAB — PREPARE RBC (CROSSMATCH)

## 2023-05-31 MED ORDER — SODIUM CHLORIDE 0.9% IV SOLUTION: Freq: Once | INTRAVENOUS | Status: AC

## 2023-05-31 MED ORDER — SODIUM BICARBONATE 650 MG PO TABS
650.0000 mg | ORAL_TABLET | Freq: Three times a day (TID) | ORAL | Status: AC
  Administered 2023-05-31 – 2023-06-02 (×8): 650 mg via ORAL
  Filled 2023-05-31 (×8): qty 1

## 2023-05-31 NOTE — Plan of Care (Signed)

## 2023-05-31 NOTE — Progress Notes (Signed)
Family not at bedside during scheduled time of visit. Awaiting psychiatry evaluation for capacity. No acute palliative needs at this time. PMT will continue to follow for ongoing needs and support.  No Charge.  Leeanne Deed, DNP, AGNP-C Palliative Medicine  Please call Palliative Medicine team phone with any questions [phone]. For individual providers please see AMION.

## 2023-05-31 NOTE — Progress Notes (Signed)
Hypoglycemic Event  CBG: 45  Treatment: 8 oz juice/soda and D50 25 mL (12.5 gm)  Symptoms: None  Follow-up CBG: Time:1703 CBG Result:133  Possible Reasons for Event: Other: Patient consuming an inadequate amount of food.  Comments/MD notified:Dr. Gillis Santa ordred Hypoglycemic Protocol.

## 2023-05-31 NOTE — Progress Notes (Signed)
Progress Note   Patient: Theresa [name]:[id number] DOB: [birth year] DOA: 05/19/2023     12 DOS: the patient was seen and examined on 05/31/2023   Brief hospital course: HPI on admission: "Theresa [name] is a 67 y.o. female with medical history significant of  Anemia of chronic disease, COPD coronary artery disease status post stent placement, Diabetes mellitus with diabetic neuropathy and retinopathy, previous history of ESRD on hemodialysis currently status post transplant, hyperlipidemia, Hypertension moderate to severe mitral regurgitation, peripheral vascular disease who is a resident in a group home brought in on account of worsening gangrene noted on the right foot.  ..." See H&P for full HPI on admission & ED course.  Patient was admitted to the hospital, started on IV heparin and Vascular Surgery was consulted, plan for angiogram today (12/11).  Further hospital course and management as outlined below.   Assessment and Plan:  Right foot gangrene, s/p AKA done on 12/12 No signs of infection identified, no antibiotics indicated at this time --s/p IV heparin drip --Vascular surgery following, Dr. Wyn Quaker --12/11 -- underwent angiogram with angioplasty to right SFA and popliteal arteries --12/12 -- underwent Right AKA --Initial dressing change by vascular this weekend --Started on Plavix 75 mg daily --Lovenox for DVT ppx started -held for few days due to low platelet count, then resumed.   --OOB to chair --PT/OT evaluations -- return to SNF anticipated 12/17 dressing was changed by vascular surgery, no complications --Follow up Vascular's recommendations 12/22 DC'd Foley catheter, follow voiding trial  Thrombocytopenia -- appears acute on chronic. Worsening since admission, platelets now in 70's --held Lovenox and resumed after improvement in the platelet count --Also on Plavix - continue for now --Daily CBC's to monitor Plts 142 stable  Hypokalemia - resolved with  replacement --Monitor BMP, replace K PRN  Hyperkalemia - K 5.8 on 12/12 treated and resolved --Monitor BMP   Hypovolemic hyponatremia - mild, Na 132 on admission.   Pt appeared clinically dehydrated. --Encourage oral hydration --Monitor BMP   Anemia of chronic disease Folic acid level 6.5, at lower end and B12 level 387, goal >400. Started folic acid 1 mg p.o. daily and B12 500 mcg p.o. daily 12/22 Hb 7.2, transfuse 1 unit of PRBC --Monitor CBC   COPD - stable, not exacerbated --PRN bronchodilators   Coronary artery disease status post stent placement --Continue statin therapy   Diabetes mellitus with diabetic neuropathy and retinopathy --Monitor CBG's --Sliding scale Novolog --Continue Lyrica  Hypoglycemia - CBG noted 44 on evening of 12/13.  Treated an improved to 175 --Hypoglycemia protocol   History of ESRD  Patient used to be on on hemodialysis currently status post transplant No longer on hemodialysis Renal function stable at baseline --On tacrolimus and azathioprine --Nephrology following   Severe hypoalbuminemia: Continue protein supplement Dietitian consulted   Hyperlipidemia: Continue Lipitor  Hypertension: --On clonidone and Imdur --Increased clonidine 0.1 mg from daily to BID for improved BP control 12/16 started losartan 25 mg p.o. daily and Lopressor 12.5 mg p.o. twice daily 12/17 increased Imdur 30 to 60 mg p.o. daily, increased Lopressor 25 mg p.o. twice daily --Monitor & adjust regimen as needed  Vitamin D insufficiency: started vitamin D 50,000 units p.o. weekly, follow with PCP to repeat vitamin D level after 3 to 6 months.  Moderate to severe mitral regurgitation --Monitor volume status  Peripheral vascular disease, now s/p R AKA --Started on Plavix per vascular   Goals of care discussion, palliative care consulted on 12/20 Overall  poor prognosis due to above comorbidities  Psych consulted to evaluate for decision-making capacity. 12/21  follow psych consult  Body mass index is 22.44 kg/m. DVT prophylaxis: SCD due to thrombocytopenia, Plts improved Lenoxaparin (LOVENOX) injection 40 mg Start: 05/28/23 2200    Nutrition Documentation    Flowsheet Row ED to Hosp-Admission (Current) from 05/19/2023 in [facility]  Nutrition Problem Increased nutrient needs  Etiology wound healing  Nutrition Goal Patient will meet greater than or equal to 90% of their needs  Interventions MVI, Liberalize Diet, Boost Breeze, Prostat     ,  Active Pressure Injury/Wound(s)     Pressure Ulcer  Duration          Pressure Injury 05/20/23 Sacrum Mid Stage 3 -  Full thickness tissue loss. Subcutaneous fat may be visible but bone, tendon or muscle are NOT exposed. 2.8 cm x 4 cm  x 0.5 cm depth (under piece of stringy slough) 6 days   Pressure Injury 05/21/23 Buttocks Left;Lateral Unstageable - Full thickness tissue loss in which the base of the injury is covered by slough (yellow, tan, gray, green or brown) and/or eschar (tan, brown or black) in the wound bed. 1 cm x 1 cm 80% yellow  5 days   Pressure Injury 05/24/23 Heel Left Unstageable - Full thickness tissue loss in which the base of the injury is covered by slough (yellow, tan, gray, green or brown) and/or eschar (tan, brown or black) in the wound bed. mushy texture to the touch, white/b 1 day              Subjective: No significant events overnight, today patient was more awake and alert, AO x 2, aware of person and place, does not know month and the year.  Patient says that she is in a lot of pain and would like to increase her pain medication.  Patient is trying to eat and drink more water. Denied any other complaints.  Physical Exam: Vitals:   05/31/23 1243 05/31/23 1401 05/31/23 1416 05/31/23 1621  BP: (!) 149/44 (!) 134/39 (!) 142/46 (!) 151/45  Pulse:  67 66 65  Resp:  17 17 17   Temp:  98.6 F (37 C) 98 F (36.7 C) 98 F (36.7 C)  TempSrc:  Oral   Oral  SpO2:      Weight:      Height:       General exam: NAD, lying comfortably. Looks chronically ill appearing HEENT: mucus membranes dry, hearing grossly normal  Respiratory system: lungs clear no wheezes or rhonchi, on room air, normal respiratory effort. Cardiovascular system: normal S1/S2, RRR, no pedal edema.   Gastrointestinal system: soft, NT, ND Central nervous system: A&O x. no gross focal neurologic deficits, normal speech Extremities: s/p right AKA, dressing CDI Psychiatry: mood depressed, affect sad face and sleepy   Data Reviewed:  Notable labs --     Na 136>>135 BUN 24>>21 Cr 1.40 >> 1.47 >> 1.41 >> 1.26>>1.18>>1.09>>1.34 Ca 8.0 Hbg 9.3>8.4>>7.2 Platelets 103 >>> 74>>102>>142k   Family Communication: None present on rounds.    Disposition: Status is: Inpatient Remains inpatient appropriate because: needs SNF placement. cleared by vascular surgery  Planned Discharge Destination: SNF placement when bed will be available.  enoxaparin (LOVENOX) injection 40 mg Start: 05/28/23 2200 Time spent: 40 minutes  Author: Gillis Santa, MD 05/31/2023 4:27 PM  For on call review [email or web address].

## 2023-05-31 NOTE — Progress Notes (Signed)
[facility]  ROUNDING NOTE   Subjective:   Patient laying in bed No family present States she did not eat, was not hungry Room air, no lower extremity edema  Creatinine 1.38  Objective:  Vital signs in last 24 hours:  Temp:  [98 F (36.7 C)-99.3 F (37.4 C)] 98 F (36.7 C) (12/22 0800) Pulse Rate:  [60-72] 62 (12/22 0800) Resp:  [17-20] 19 (12/22 0800) BP: (146-175)/(44-54) 169/50 (12/22 0800) SpO2:  [100 %] 100 % (12/22 0800)  Weight change:  Filed Weights   05/19/23 1643 05/20/23 1234 05/21/23 1019  Weight: 66.2 kg 65 kg 65 kg    Intake/Output: I/O last 3 completed shifts: In: 2009.9 [P.O.:480; I.V.:1529.9] Out: 500 [Urine:500]   Intake/Output this shift:  No intake/output data recorded.  Physical Exam: General: NAD, laying in bed  Head: Normocephalic, atraumatic. Moist oral mucosal membranes  Eyes: Anicteric  Lungs:  Clear to auscultation, normal effort  Heart: Regular rate and rhythm  Abdomen:  Soft, nontender  Extremities: No peripheral edema.  Rt AKA on 05/21/23. Dressing clean, dry and intact  Neurologic: Alert, oriented to self  Skin: No lesions  Access: None    Basic Metabolic Panel: Recent Labs  Lab 05/26/23 1126 05/27/23 0538 05/28/23 0623 05/29/23 0627 05/30/23 0434 05/31/23 0521  NA 133* 136 134* 137 135 136  K 4.6 4.9 4.5 4.7 4.9 4.5  CL 105 106 105 107 108 110  CO2 21* 23 21* 23 21* 20*  GLUCOSE 355* 96 98 94 90 92  BUN 22 21 19 22 21 22   CREATININE 1.24* 1.18* 1.09* 1.35* 1.34* 1.38*  CALCIUM 8.1* 8.5* 8.4* 8.2* 8.0* 8.2*  MG 1.9 2.1 2.1 2.1  --   --   PHOS 2.9 2.7 3.2 3.4  --   --     Liver Function Tests: No results for input(s): "AST", "ALT", "ALKPHOS", "BILITOT", "PROT", "ALBUMIN" in the last 168 hours.  No results for input(s): "LIPASE", "AMYLASE" in the last 168 hours. No results for input(s): "AMMONIA" in the last 168 hours.  CBC: Recent Labs  Lab 05/27/23 0538 05/28/23 0623 05/29/23 0627  05/30/23 0434 05/31/23 0521  WBC 6.1 5.3 5.3 6.0 5.2  HGB 8.4* 8.4* 8.3* 7.6* 7.2*  HCT 26.7* 26.2* 25.9* 22.9* 22.3*  MCV 102.3* 100.4* 102.0* 99.6 101.8*  PLT 94* 102* 117* 142* 139*    Cardiac Enzymes: No results for input(s): "CKTOTAL", "CKMB", "CKMBINDEX", "TROPONINI" in the last 168 hours.  BNP: Invalid input(s): "POCBNP"  CBG: Recent Labs  Lab 05/30/23 0849 05/30/23 1229 05/30/23 1604 05/30/23 2026 05/31/23 1034  GLUCAP 86 293* 97 83 160*    Microbiology: Results for orders placed or performed during the hospital encounter of 05/19/23  Surgical pcr screen     Status: None   Collection Time: 05/21/23  3:34 AM   Specimen: Nasal Mucosa; Nasal Swab  Result Value Ref Range Status   MRSA, PCR NEGATIVE NEGATIVE Final   Staphylococcus aureus NEGATIVE NEGATIVE Final    Comment: (NOTE) The Xpert SA Assay (FDA approved for NASAL specimens in patients 3 years of age and older), is one component of a comprehensive surveillance program. It is not intended to diagnose infection nor to guide or monitor treatment. Performed at [facility], [address] [place], [place] [address]     Coagulation Studies: No results for input(s): "LABPROT", "INR" in the last 72 hours.   Urinalysis: No results for input(s): "COLORURINE", "LABSPEC", "PHURINE", "GLUCOSEU", "HGBUR", "BILIRUBINUR", "KETONESUR", "PROTEINUR", "UROBILINOGEN", "  NITRITE", "LEUKOCYTESUR" in the last 72 hours.  Invalid input(s): "APPERANCEUR"    Imaging: No results found.    Medications:      sodium chloride   Intravenous Once   acetaminophen  650 mg Oral Q8H   vitamin C  500 mg Oral BID   atorvastatin  20 mg Oral Daily   azaTHIOprine  100 mg Oral Daily   Chlorhexidine Gluconate Cloth  6 each Topical Daily   cloNIDine  0.1 mg Oral BID   clopidogrel  75 mg Oral Daily   vitamin B-12  500 mcg Oral Daily   enoxaparin (LOVENOX) injection  40 mg Subcutaneous Q24H   feeding supplement  1  Container Oral TID BM   folic acid  1 mg Oral Daily   insulin aspart  0-5 Units Subcutaneous QHS   insulin aspart  0-6 Units Subcutaneous TID WC   isosorbide mononitrate  60 mg Oral Daily   leptospermum manuka honey  1 Application Topical Daily   losartan  25 mg Oral Daily   metoprolol tartrate  50 mg Oral BID   multivitamin with minerals  1 tablet Oral Daily   OLANZapine zydis  10 mg Oral QHS   pantoprazole  40 mg Oral Daily   pregabalin  75 mg Oral BID   sodium bicarbonate  650 mg Oral TID   tacrolimus ER  10 mg Oral QAC breakfast   traZODone  150 mg Oral QHS   Vitamin D (Ergocalciferol)  50,000 Units Oral Q7 days   zinc sulfate (50mg  elemental zinc)  220 mg Oral Daily   haloperidol lactate, hydrALAZINE, hydrALAZINE, ipratropium-albuterol, morphine injection, mouth rinse, oxyCODONE  Assessment/ Plan:  [name] is a 67 y.o.  female with past medical conditions including COPD, anemia, diabetes, CAD, hyperlipidemia, status post renal transplant, hypertension, who was admitted to [facility] on 05/19/2023 for Arterial occlusion [I70.90] Diabetes mellitus with foot ulcer and gangrene (HCC) [Z61.096, L97.509, E11.52] Gangrene of right foot (HCC) [I96]   Chronic kidney disease stage IIIb, status post cadaver renal transplant completed at [facility] on 03/30/2019.  Baseline creatinine 1-1.5.   - Immunosuppressive medications: tacrolimus and azothiaprine.  - no indication for tacrolimus level at this time.  - Renal function remains stable   Lab Results  Component Value Date   CREATININE 1.38 (H) 05/31/2023   CREATININE 1.34 (H) 05/30/2023   CREATININE 1.35 (H) 05/29/2023    Intake/Output Summary (Last 24 hours) at 05/31/2023 1229 Last data filed at 05/31/2023 0136 Gross per 24 hour  Intake --  Output 400 ml  Net -400 ml     2.  Hypertension with chronic kidney disease.  Home regimen includes furosemide, clonidine, and isosorbide.  Furosemide currently held.  Blood pressure stable  today  3. Anemia of chronic kidney disease Lab Results  Component Value Date   HGB 7.2 (L) 05/31/2023   Hgb decreased to 7.2. Will defer need of blood transfusion to primary team   LOS: 12 Jocelyne Reinertsen 12/22/202412:29 PM

## 2023-06-01 DIAGNOSIS — Z940 Kidney transplant status: Principal | ICD-10-CM

## 2023-06-01 DIAGNOSIS — L97509 Non-pressure chronic ulcer of other part of unspecified foot with unspecified severity: Secondary | ICD-10-CM | POA: Diagnosis not present

## 2023-06-01 DIAGNOSIS — E1152 Type 2 diabetes mellitus with diabetic peripheral angiopathy with gangrene: Secondary | ICD-10-CM | POA: Diagnosis not present

## 2023-06-01 DIAGNOSIS — E11621 Type 2 diabetes mellitus with foot ulcer: Secondary | ICD-10-CM | POA: Diagnosis not present

## 2023-06-01 LAB — CBC
HCT: 26.6 % — ABNORMAL LOW (ref 36.0–46.0)
Hemoglobin: 8.5 g/dL — ABNORMAL LOW (ref 12.0–15.0)
MCH: 31.5 pg (ref 26.0–34.0)
MCHC: 32 g/dL (ref 30.0–36.0)
MCV: 98.5 fL (ref 80.0–100.0)
Platelets: 155 10*3/uL (ref 150–400)
RBC: 2.7 MIL/uL — ABNORMAL LOW (ref 3.87–5.11)
RDW: 19.9 % — ABNORMAL HIGH (ref 11.5–15.5)
WBC: 5.3 10*3/uL (ref 4.0–10.5)
nRBC: 0 % (ref 0.0–0.2)

## 2023-06-01 LAB — BASIC METABOLIC PANEL
Anion gap: 7 (ref 5–15)
BUN: 23 mg/dL (ref 8–23)
CO2: 20 mmol/L — ABNORMAL LOW (ref 22–32)
Calcium: 8.3 mg/dL — ABNORMAL LOW (ref 8.9–10.3)
Chloride: 109 mmol/L (ref 98–111)
Creatinine, Ser: 1.51 mg/dL — ABNORMAL HIGH (ref 0.44–1.00)
GFR, Estimated: 38 mL/min — ABNORMAL LOW (ref >60)
Glucose, Bld: 95 mg/dL (ref 70–99)
Potassium: 4.5 mmol/L (ref 3.5–5.1)
Sodium: 136 mmol/L (ref 135–145)

## 2023-06-01 LAB — BPAM RBC
Blood Product Expiration Date: 202501132359
ISSUE DATE / TIME: 202412221356
Unit Type and Rh: 5100

## 2023-06-01 LAB — GLUCOSE, CAPILLARY
Glucose-Capillary: 125 mg/dL — ABNORMAL HIGH (ref 70–99)
Glucose-Capillary: 161 mg/dL — ABNORMAL HIGH (ref 70–99)
Glucose-Capillary: 129 mg/dL — ABNORMAL HIGH (ref 70–99)
Glucose-Capillary: 121 mg/dL — ABNORMAL HIGH (ref 70–99)

## 2023-06-01 LAB — TYPE AND SCREEN
ABO/RH(D): O POS
Antibody Screen: NEGATIVE
Unit division: 0

## 2023-06-01 LAB — MAGNESIUM: Magnesium: 2.4 mg/dL (ref 1.7–2.4)

## 2023-06-01 LAB — PHOSPHORUS: Phosphorus: 3.4 mg/dL (ref 2.5–4.6)

## 2023-06-01 NOTE — Plan of Care (Signed)

## 2023-06-01 NOTE — Progress Notes (Signed)
OT Cancellation Note  Patient Details Name: Theresa [name] MRN: [id number] DOB: 1955/08/09   Cancelled Treatment:    Reason Eval/Treat Not Completed: Patient declined, no reason specified Pt received in bed, declining mobility or bed level ADLs. Pt becoming agitated and cursing at therapists when further attempts made. Pt closed eyes and refused to answer questions. Will re-attempt at a later date and time to encourage participation.  Yvana Samonte L. Denilson Salminen, OTR/L  06/01/23, 2:24 PM

## 2023-06-01 NOTE — TOC Progression Note (Signed)
Transition of Care [facility]) - Progression Note    Patient Details  Name: Theresa [name] MRN: [id number] Date of Birth: [birth year]  Transition of Care [facility]) CM/SW Contact  Truddie Hidden, RN Phone Number: 06/01/2023, 3:26 PM  Clinical Narrative:    Spoke with patient's son, [name]. Patient was living with him prior to admission at the address listed. Patient son stated she was receiving hospice services. He is agreeable to SNF however would like that decision to be made by patient's granddaughter.  Attempt to contact [name]. No answer. Left a message.          Expected Discharge Plan and Services                                               Social Determinants of Health (SDOH) Interventions SDOH Screenings   Food Insecurity: Patient Unable To Answer (05/20/2023)  Housing: High Risk (05/20/2023)  Transportation Needs: Patient Unable To Answer (05/20/2023)  Utilities: Patient Unable To Answer (05/20/2023)  Alcohol Screen: Low Risk  (12/17/2022)  Depression (PHQ2-9): Medium Risk (12/31/2022)  Financial Resource Strain: Low Risk  (02/13/2023)   Received from [facility]  Physical Activity: Inactive (12/17/2022)  Social Connections: Socially Isolated (12/17/2022)  Stress: No Stress Concern Present (12/17/2022)  Tobacco Use: Medium Risk (05/21/2023)  Health Literacy: Low Risk  (09/13/2020)   Received from [facility], [facility]    Readmission Risk Interventions     No data to display

## 2023-06-01 NOTE — Care Management Important Message (Signed)
Important Message  Patient Details  Name: Theresa [name] MRN: [id number] Date of Birth: February 28, 1956   Important Message Given:  Yes - Medicare IM     [name] 06/01/2023, 12:09 PM

## 2023-06-01 NOTE — Progress Notes (Signed)
Progress Note   Patient: Theresa [name] ZOX:[id number] DOB: September 22, 1955 DOA: 05/19/2023     13 DOS: the patient was seen and examined on 06/01/2023    Brief hospital course: HPI on admission: "Theresa [name] is a 67 y.o. female with medical history significant of  Anemia of chronic disease, COPD coronary artery disease status post stent placement, Diabetes mellitus with diabetic neuropathy and retinopathy, previous history of ESRD on hemodialysis currently status post transplant, hyperlipidemia, Hypertension moderate to severe mitral regurgitation, peripheral vascular disease who is a resident in a group home brought in on account of worsening gangrene noted on the right foot.  ..." See H&P for full HPI on admission & ED course.   Patient was admitted to the hospital, started on IV heparin and Vascular Surgery was consulted, plan for angiogram today (12/11).   Further hospital course and management as outlined below.     Assessment and Plan:   Right foot gangrene, s/p AKA done on 12/12 Vascular surgeon on board and case discussed On 05/20/2023 patient underwent angiogram with angioplasty to right SFA and popliteal arteries Patient underwent a right AKA on 05/21/2023 Continue Plavix Continue PT OT Foley catheter discontinued, continue to follow-up on voiding trial   Thrombocytopenia -- appears acute on chronic. Worsening since admission, platelets now in 70's --held Lovenox and resumed after improvement in the platelet count --Also on Plavix - continue for now --Daily CBC's to monitor Plts 142 stable   Hypokalemia - resolved with replacement --Monitor BMP, replace K PRN   Hyperkalemia - K 5.8 on 12/12 treated and resolved --Monitor BMP   Hypovolemic hyponatremia - mild, Na 132 on admission.   Pt appeared clinically dehydrated. --Encourage oral hydration --Monitor BMP   Anemia of chronic disease Status post 1 unit blood transfusion Monitor CBC closely  COPD - stable, not  exacerbated --PRN bronchodilators   Coronary artery disease status post stent placement --Continue statin therapy   Diabetes mellitus with diabetic neuropathy and retinopathy --Monitor CBG's --Sliding scale Novolog --Continue Lyrica   Hypoglycemia - CBG noted 44 on evening of 12/13.  Treated an improved to 175 --Hypoglycemia protocol   History of ESRD  Patient used to be on on hemodialysis currently status post transplant No longer on hemodialysis Renal function stable at baseline --On tacrolimus and azathioprine Nephrologist on board in case discussed   Severe hypoalbuminemia: Continue protein supplement Dietitian consulted   Hyperlipidemia: Continue Lipitor   Hypertension:  Continue current antihypertensives   Vitamin D insufficiency: Continue vitamin D supplementation   Moderate to severe mitral regurgitation --Monitor volume status   Peripheral vascular disease, now s/p R AKA Continue on Plavix per vascular    Goals of care discussion, palliative care consulted on 12/20 Overall poor prognosis due to above comorbidities   Psych consulted to evaluate for decision-making capacity. Follow-up psych recommendation   Body mass index is 22.44 kg/m. DVT prophylaxis: SCD due to thrombocytopenia, Plts improved Lenoxaparin (LOVENOX) injection 40 mg Start: 05/28/23 2200      Subjective:  Patient seen and examined at bedside this morning Hemoglobin have remained stable Denies any bleeding, cough chest pain or abdominal pain  Physical Exam: General exam: NAD, lying comfortably. Looks chronically ill appearing HEENT: mucus membranes dry, hearing grossly normal  Respiratory system: lungs clear no wheezes or rhonchi, on room air, normal respiratory effort. Cardiovascular system: normal S1/S2, RRR, no pedal edema.   Gastrointestinal system: soft, NT, ND Central nervous system: A&O x. no gross focal neurologic deficits, normal  speech Extremities: s/p right AKA, dressing  CDI Psychiatry: mood depressed, affect sad face and sleepy     Family Communication: None present on rounds.      Disposition: Status is: Inpatient Remains inpatient appropriate because: needs SNF placement. TOC working on days   Planned Discharge Destination: SNF placement when bed will be available.   enoxaparin (LOVENOX) injection 40 mg Start: 05/28/23 2200    Vitals:   05/31/23 2107 06/01/23 0044 06/01/23 0806 06/01/23 1306  BP: (!) 152/46 (!) 139/53 (!) 179/52 (!) 157/45  Pulse: 71 66 68 62  Resp: 20 20 16 16   Temp: 98.5 F (36.9 C) 98 F (36.7 C) 98.3 F (36.8 C)   TempSrc: Oral Oral    SpO2: 100% 98% 100% 100%  Weight:      Height:        Data Reviewed:    Latest Ref Rng & Units 06/01/2023    6:56 AM 05/31/2023    5:21 AM 05/30/2023    4:34 AM  BMP  Glucose 70 - 99 mg/dL 95  92  90   BUN 8 - 23 mg/dL 23  22  21    Creatinine 0.44 - 1.00 mg/dL 4.09  8.11  9.14   Sodium 135 - 145 mmol/L 136  136  135   Potassium 3.5 - 5.1 mmol/L 4.5  4.5  4.9   Chloride 98 - 111 mmol/L 109  110  108   CO2 22 - 32 mmol/L 20  20  21    Calcium 8.9 - 10.3 mg/dL 8.3  8.2  8.0        Latest Ref Rng & Units 06/01/2023    6:56 AM 05/31/2023    5:38 PM 05/31/2023    5:21 AM  CBC  WBC 4.0 - 10.5 K/uL 5.3   5.2   Hemoglobin 12.0 - 15.0 g/dL 8.5  8.3  7.2   Hematocrit 36.0 - 46.0 % 26.6  26.0  22.3   Platelets 150 - 400 K/uL 155   139      Time spent: 42 minutes  Author: Loyce Dys, MD 06/01/2023 3:50 PM  For on call review [email or web address].

## 2023-06-01 NOTE — Progress Notes (Signed)
Per notes, family not at bedside during scheduled time of visit yesterday. Awaiting psychiatry evaluation for capacity.  PMT will shadow to determine patient's ability to make her own decisions versus surrogate decision-maker.  No charge

## 2023-06-01 NOTE — Progress Notes (Signed)
PT Cancellation Note  Patient Details Name: Theresa [name] MRN: [id number] DOB: 1955-10-09   Cancelled Treatment:    Reason Eval/Treat Not Completed: Patient declined, no reason specified Patient supine in bed and refusing any type of mobility this date. When questioned or attempt to initiate movement, patient increasingly agitated and cursing at therapist. Will re-attempt later date/time.   Maylon Peppers, PT, DPT Physical Therapist - [facility]  [facility]  Daymeon Fischman A Colby Reels 06/01/2023, 2:08 PM

## 2023-06-01 NOTE — Progress Notes (Signed)
[facility]  ROUNDING NOTE   Subjective:   Patient laying in bed Alert and oriented to self and place Very little conversation offered  Creatinine 1.51  Objective:  Vital signs in last 24 hours:  Temp:  [98 F (36.7 C)-98.6 F (37 C)] 98.3 F (36.8 C) (12/23 0806) Pulse Rate:  [65-71] 68 (12/23 0806) Resp:  [16-20] 16 (12/23 0806) BP: (134-179)/(39-53) 179/52 (12/23 0806) SpO2:  [98 %-100 %] 100 % (12/23 0806)  Weight change:  Filed Weights   05/19/23 1643 05/20/23 1234 05/21/23 1019  Weight: 66.2 kg 65 kg 65 kg    Intake/Output: I/O last 3 completed shifts: In: 598.5 [I.V.:19.3; Blood:279.2; Other:300] Out: 800 [Urine:800]   Intake/Output this shift:  No intake/output data recorded.  Physical Exam: General: NAD, laying in bed  Head: Normocephalic, atraumatic. Moist oral mucosal membranes  Eyes: Anicteric  Lungs:  Clear to auscultation, normal effort  Heart: Regular rate and rhythm  Abdomen:  Soft, nontender  Extremities: No peripheral edema.  Rt AKA on 05/21/23. Dressing clean, dry and intact  Neurologic: Alert, oriented to self and place  Skin: No lesions  Access: None    Basic Metabolic Panel: Recent Labs  Lab 05/26/23 1126 05/27/23 0538 05/28/23 0623 05/29/23 0627 05/30/23 0434 05/31/23 0521 06/01/23 0656  NA 133* 136 134* 137 135 136 136  K 4.6 4.9 4.5 4.7 4.9 4.5 4.5  CL 105 106 105 107 108 110 109  CO2 21* 23 21* 23 21* 20* 20*  GLUCOSE 355* 96 98 94 90 92 95  BUN 22 21 19 22 21 22 23   CREATININE 1.24* 1.18* 1.09* 1.35* 1.34* 1.38* 1.51*  CALCIUM 8.1* 8.5* 8.4* 8.2* 8.0* 8.2* 8.3*  MG 1.9 2.1 2.1 2.1  --   --  2.4  PHOS 2.9 2.7 3.2 3.4  --   --  3.4    Liver Function Tests: No results for input(s): "AST", "ALT", "ALKPHOS", "BILITOT", "PROT", "ALBUMIN" in the last 168 hours.  No results for input(s): "LIPASE", "AMYLASE" in the last 168 hours. No results for input(s): "AMMONIA" in the last 168 hours.  CBC: Recent Labs   Lab 05/28/23 0623 05/29/23 0627 05/30/23 0434 05/31/23 0521 05/31/23 1738 06/01/23 0656  WBC 5.3 5.3 6.0 5.2  --  5.3  HGB 8.4* 8.3* 7.6* 7.2* 8.3* 8.5*  HCT 26.2* 25.9* 22.9* 22.3* 26.0* 26.6*  MCV 100.4* 102.0* 99.6 101.8*  --  98.5  PLT 102* 117* 142* 139*  --  155    Cardiac Enzymes: No results for input(s): "CKTOTAL", "CKMB", "CKMBINDEX", "TROPONINI" in the last 168 hours.  BNP: Invalid input(s): "POCBNP"  CBG: Recent Labs  Lab 05/31/23 1034 05/31/23 1427 05/31/23 1727 05/31/23 2201 06/01/23 0938  GLUCAP 160* 176* 116* 97 125*    Microbiology: Results for orders placed or performed during the hospital encounter of 05/19/23  Surgical pcr screen     Status: None   Collection Time: 05/21/23  3:34 AM   Specimen: Nasal Mucosa; Nasal Swab  Result Value Ref Range Status   MRSA, PCR NEGATIVE NEGATIVE Final   Staphylococcus aureus NEGATIVE NEGATIVE Final    Comment: (NOTE) The Xpert SA Assay (FDA approved for NASAL specimens in patients 31 years of age and older), is one component of a comprehensive surveillance program. It is not intended to diagnose infection nor to guide or monitor treatment. Performed at [facility], [address] [place], [place] [address]     Coagulation Studies: No results for input(s): "  LABPROT", "INR" in the last 72 hours.   Urinalysis: No results for input(s): "COLORURINE", "LABSPEC", "PHURINE", "GLUCOSEU", "HGBUR", "BILIRUBINUR", "KETONESUR", "PROTEINUR", "UROBILINOGEN", "NITRITE", "LEUKOCYTESUR" in the last 72 hours.  Invalid input(s): "APPERANCEUR"    Imaging: No results found.    Medications:      acetaminophen  650 mg Oral Q8H   vitamin C  500 mg Oral BID   atorvastatin  20 mg Oral Daily   azaTHIOprine  100 mg Oral Daily   Chlorhexidine Gluconate Cloth  6 each Topical Daily   cloNIDine  0.1 mg Oral BID   clopidogrel  75 mg Oral Daily   vitamin B-12  500 mcg Oral Daily   enoxaparin (LOVENOX)  injection  40 mg Subcutaneous Q24H   feeding supplement  1 Container Oral TID BM   folic acid  1 mg Oral Daily   insulin aspart  0-5 Units Subcutaneous QHS   insulin aspart  0-6 Units Subcutaneous TID WC   isosorbide mononitrate  60 mg Oral Daily   leptospermum manuka honey  1 Application Topical Daily   losartan  25 mg Oral Daily   metoprolol tartrate  50 mg Oral BID   multivitamin with minerals  1 tablet Oral Daily   OLANZapine zydis  10 mg Oral QHS   pantoprazole  40 mg Oral Daily   pregabalin  75 mg Oral BID   sodium bicarbonate  650 mg Oral TID   tacrolimus ER  10 mg Oral QAC breakfast   traZODone  150 mg Oral QHS   Vitamin D (Ergocalciferol)  50,000 Units Oral Q7 days   zinc sulfate (50mg  elemental zinc)  220 mg Oral Daily   haloperidol lactate, hydrALAZINE, hydrALAZINE, ipratropium-albuterol, morphine injection, mouth rinse, oxyCODONE  Assessment/ Plan:  Theresa [name] is a 67 y.o.  female with past medical conditions including COPD, anemia, diabetes, CAD, hyperlipidemia, status post renal transplant, hypertension, who was admitted to [facility] on 05/19/2023 for Arterial occlusion [I70.90] Diabetes mellitus with foot ulcer and gangrene (HCC) [Q59.563, L97.509, E11.52] Gangrene of right foot (HCC) [I96]   Chronic kidney disease stage IIIb, status post cadaver renal transplant completed at [facility] on 03/30/2019.  Baseline creatinine 1-1.5.   - Immunosuppressive medications: tacrolimus and azothiaprine.   - Renal function continues to fluctuate within acceptable range. - Will order tacrolimus level in AM.   Lab Results  Component Value Date   CREATININE 1.51 (H) 06/01/2023   CREATININE 1.38 (H) 05/31/2023   CREATININE 1.34 (H) 05/30/2023    Intake/Output Summary (Last 24 hours) at 06/01/2023 1119 Last data filed at 05/31/2023 2015 Gross per 24 hour  Intake 298.5 ml  Output 400 ml  Net -101.5 ml     2.  Hypertension with chronic kidney disease.  Home regimen includes  furosemide, clonidine, and isosorbide.  Furosemide currently held.  Blood pressure slightly elevated today, 179/52.  3. Anemia of chronic kidney disease Lab Results  Component Value Date   HGB 8.5 (L) 06/01/2023   Hgb improved after 1 unit blood transfusion yesterday.     LOS: 13 Kinzi Frediani 12/23/202411:19 AM

## 2023-06-02 DIAGNOSIS — E1152 Type 2 diabetes mellitus with diabetic peripheral angiopathy with gangrene: Secondary | ICD-10-CM | POA: Diagnosis not present

## 2023-06-02 DIAGNOSIS — Z7189 Other specified counseling: Secondary | ICD-10-CM

## 2023-06-02 DIAGNOSIS — E11621 Type 2 diabetes mellitus with foot ulcer: Secondary | ICD-10-CM | POA: Diagnosis not present

## 2023-06-02 DIAGNOSIS — L97509 Non-pressure chronic ulcer of other part of unspecified foot with unspecified severity: Secondary | ICD-10-CM | POA: Diagnosis not present

## 2023-06-02 LAB — GLUCOSE, CAPILLARY
Glucose-Capillary: 96 mg/dL (ref 70–99)
Glucose-Capillary: 108 mg/dL — ABNORMAL HIGH (ref 70–99)
Glucose-Capillary: 113 mg/dL — ABNORMAL HIGH (ref 70–99)
Glucose-Capillary: 88 mg/dL (ref 70–99)

## 2023-06-02 LAB — BASIC METABOLIC PANEL
Anion gap: 8 (ref 5–15)
BUN: 28 mg/dL — ABNORMAL HIGH (ref 8–23)
CO2: 20 mmol/L — ABNORMAL LOW (ref 22–32)
Calcium: 8.5 mg/dL — ABNORMAL LOW (ref 8.9–10.3)
Chloride: 108 mmol/L (ref 98–111)
Creatinine, Ser: 1.66 mg/dL — ABNORMAL HIGH (ref 0.44–1.00)
GFR, Estimated: 34 mL/min — ABNORMAL LOW (ref >60)
Glucose, Bld: 93 mg/dL (ref 70–99)
Potassium: 4.7 mmol/L (ref 3.5–5.1)
Sodium: 136 mmol/L (ref 135–145)

## 2023-06-02 LAB — CBC
HCT: 25.8 % — ABNORMAL LOW (ref 36.0–46.0)
Hemoglobin: 8.3 g/dL — ABNORMAL LOW (ref 12.0–15.0)
MCH: 31.1 pg (ref 26.0–34.0)
MCHC: 32.2 g/dL (ref 30.0–36.0)
MCV: 96.6 fL (ref 80.0–100.0)
Platelets: 183 10*3/uL (ref 150–400)
RBC: 2.67 MIL/uL — ABNORMAL LOW (ref 3.87–5.11)
RDW: 19.3 % — ABNORMAL HIGH (ref 11.5–15.5)
WBC: 5.4 10*3/uL (ref 4.0–10.5)
nRBC: 0 % (ref 0.0–0.2)

## 2023-06-02 LAB — MAGNESIUM: Magnesium: 2.4 mg/dL (ref 1.7–2.4)

## 2023-06-02 LAB — PHOSPHORUS: Phosphorus: 3.6 mg/dL (ref 2.5–4.6)

## 2023-06-02 MED ORDER — SODIUM CHLORIDE 0.9 % IV SOLN: INTRAVENOUS | Status: DC

## 2023-06-02 NOTE — Progress Notes (Signed)
Nutrition Follow-up  DOCUMENTATION CODES:   Not applicable  INTERVENTION:   -Continue regular diet -Continue MVI with minerals daily -Continue 500 mg vitamin C BID -Continue 220 mg zinc sulfate daily x 14 days -Continue Boost Breeze po TID, each supplement provides 250 kcal and 9 grams of protein  -If aggressive care is warranted, replete vitamin A- 10,000 units vitamin A daily x 60 days -Continue Magic cup TID with meals, each supplement provides 290 kcal and 9 grams of protein  -Continue feeding assistance with meals  -Case dicussed with RN and MD; palliative care has been consulted for goals of care. If aggressive care is warranted, consider initiation of enteral nutrition support:   Initiate Osmolite 1.5 @ 20 ml/hr and increase by 10 ml every 8 hours to goal rate of 50 ml/hr.    60 ml Prosource TF BID   If no IVFs, 140 ml free water flush every 4 hours     Tube feeding regimen provides 1880 kcal (100% of needs), 95 grams of protein, and 918 ml of H2O. Total free water: 1751 ml daily   -If feedings are initiated, monitor Kg, Mg, and Phos and replete as needed secondary to refeeding risk; also add 100 mg thiamine x 7 days and MVI daily   NUTRITION DIAGNOSIS:   Increased nutrient needs related to wound healing as evidenced by estimated needs.  Ongoing  GOAL:   Patient will meet greater than or equal to 90% of their needs  Unmet  MONITOR:   PO intake, Supplement acceptance  REASON FOR ASSESSMENT:   Consult Assessment of nutrition requirement/status  ASSESSMENT:   Pt with medical history significant of   Anemia of chronic disease, COPD  coronary artery disease status post stent placement, Diabetes mellitus with diabetic neuropathy and retinopathy, previous history of ESRD on hemodialysis currently status post transplant, hyperlipidemia, Hypertension moderate to severe mitral regurgitation, peripheral vascular disease who is a resident in a group home brought in on  account of worsening gangrene noted on the right foot.  12/11- s/p Procedure(s): Lower Extremity Angiography (Right) 12/12- s/p rt AKA  Pt continues to eat poorly. She is lethargic and refusing supplements and food. Palliative care following for goals of care discussions. Per psychiatry, pt does not have the capacity t make medical decisions. Palliative care has reached out to family; considering transitioning to hospice, but want to talk amongst themselves. Plan to follow-up on 06/04/23.   No new wt since last visit.   Medications reviewed and include vitamin C, catapres, plavix, lovenox, vitamin B-12, folic acid, zyprexa, sodium bicarbonate, vitamin D, and vitamin C.  Labs reviewed: CBGS: 96-121 (inpatient orders for glycemic control are 0-5 units insulin aspart daily at bedtime, 0-6 units insulin aspart TID with meals).    Diet Order:   Diet Order             Diet regular Room service appropriate? Yes; Fluid consistency: Thin  Diet effective now                   EDUCATION NEEDS:   Education needs have been addressed  Skin:  Skin Assessment: Skin Integrity Issues: Skin Integrity Issues:: Incisions, Stage III, Unstageable Stage III: sacrum Unstageable: lt lateral buttock x 2 Incisions: s/p rt AKA on 05/21/23  Last BM:  05/28/23 (type 5)  Height:   Ht Readings from Last 1 Encounters:  05/21/23 5\' 7"  (1.702 m)    Weight:   Wt Readings from Last 1 Encounters:  05/21/23  65 kg    Ideal Body Weight:  56.5 kg (adjusted for rt AKA)  BMI:  Body mass index is 22.44 kg/m.  Estimated Nutritional Needs:   Kcal:  1700-1900  Protein:  90-105 grams  Fluid:  > 1.7 L    Levada Schilling, RD, LDN, CDCES Registered Dietitian III Certified Diabetes Care and Education Specialist If unable to reach this RD, please use "RD Inpatient" group chat on secure chat between hours of 8am-4 pm daily

## 2023-06-02 NOTE — Plan of Care (Signed)

## 2023-06-02 NOTE — Progress Notes (Signed)
Daily Progress Note   Patient Name: Theresa [name]       Date: 06/02/2023 DOB: March 25, 1956  Age: 67 y.o. MRN#: [id number] Attending Physician: Loyce Dys, MD Primary Care Physician: Dana Allan, MD Admit Date: 05/19/2023  Reason for Consultation/Follow-up: Establishing goals of care  Subjective: Notes and labs reviewed. In to see patient. She is resting in bed, no family at bedside. Nurse states she is trying to provide medications but patient does not want to take them. Patient does not open her eyes to look at me or speak to me.   Psychiatry note reviewed with recommendations to call legal guardian as patient does not have capacity to make her own decisions.   Called to speak with son Theresa [name]. He states he and his niece are legal guardian. He states he has been kept updated and understands her status and that she is not eating well. He is unsure of the hospice agency that had been following his mother and states the papers are at home. He states he will need to speak with his niece regarding decisions on resuming hospice care. As today is Christmas Eve, he requests that someone call him the day after Christmas (the 26th) to discuss decision.   Length of Stay: 14  Current Medications: Scheduled Meds:   acetaminophen  650 mg Oral Q8H   vitamin C  500 mg Oral BID   atorvastatin  20 mg Oral Daily   azaTHIOprine  100 mg Oral Daily   Chlorhexidine Gluconate Cloth  6 each Topical Daily   cloNIDine  0.1 mg Oral BID   clopidogrel  75 mg Oral Daily   vitamin B-12  500 mcg Oral Daily   enoxaparin (LOVENOX) injection  40 mg Subcutaneous Q24H   feeding supplement  1 Container Oral TID BM   folic acid  1 mg Oral Daily   insulin aspart  0-5 Units Subcutaneous QHS   insulin aspart  0-6 Units  Subcutaneous TID WC   isosorbide mononitrate  60 mg Oral Daily   leptospermum manuka honey  1 Application Topical Daily   losartan  25 mg Oral Daily   metoprolol tartrate  50 mg Oral BID   multivitamin with minerals  1 tablet Oral Daily   OLANZapine zydis  10 mg Oral QHS   pantoprazole  40  mg Oral Daily   pregabalin  75 mg Oral BID   sodium bicarbonate  650 mg Oral TID   tacrolimus ER  10 mg Oral QAC breakfast   traZODone  150 mg Oral QHS   Vitamin D (Ergocalciferol)  50,000 Units Oral Q7 days   zinc sulfate (50mg  elemental zinc)  220 mg Oral Daily    Continuous Infusions:  sodium chloride      PRN Meds: haloperidol lactate, hydrALAZINE, hydrALAZINE, ipratropium-albuterol, morphine injection, mouth rinse, oxyCODONE  Physical Exam Constitutional:      Comments: Eyes closed.   Pulmonary:     Effort: Pulmonary effort is normal.             Vital Signs: BP (!) 157/50 (BP Location: Right Arm)   Pulse 65   Temp 97.8 F (36.6 C)   Resp 18   Ht 5\' 7"  (1.702 m)   Wt 65 kg   SpO2 100%   BMI 22.44 kg/m  SpO2: SpO2: 100 % O2 Device: O2 Device: Room Air O2 Flow Rate: O2 Flow Rate (L/min): 0 L/min  Intake/output summary: No intake or output data in the 24 hours ending 06/02/23 1039 LBM: Last BM Date : 05/30/23 Baseline Weight: Weight: 66.2 kg Most recent weight: Weight: 65 kg         Patient Active Problem List   Diagnosis Date Noted   Gangrene of right foot (HCC) 05/20/2023   Arterial occlusion 05/20/2023   Diabetes mellitus with foot ulcer and gangrene (HCC) 05/19/2023   Annual physical exam 01/12/2023   Chronic obstructive pulmonary disease (HCC) 01/12/2023   Need for vaccination 01/12/2023   Postmenopausal estrogen deficiency 01/12/2023   Mass of right breast 07/03/2021   Hair loss 05/22/2021   Lung nodules 12/04/2020   Lumbar radiculopathy 02/27/2020   Aortic atherosclerosis (HCC) 02/24/2020   Vitamin D deficiency 06/20/2019   Kidney transplanted 03/30/2019    Dementia (HCC) 02/28/2019   Allergic rhinitis 09/23/2018   Valvular heart disease 01/05/2018   Thyroid nodule 08/11/2017   CHF (congestive heart failure) (HCC) 07/26/2017   PAD (peripheral artery disease) (HCC) 03/07/2017   Bilateral carotid artery stenosis 02/17/2017   OSA on CPAP 12/20/2015   Osteoarthritis of right hip 08/23/2013   DDD (degenerative disc disease), lumbosacral 08/23/2013   Diabetic retinopathy (HCC) 05/28/2013   Hyperlipidemia    End stage renal disease (HCC) 11/08/2012   Essential hypertension 11/08/2012   Diabetic neuropathy (HCC) 02/18/2012   Anemia in chronic kidney disease (CKD) 02/18/2012   Diabetes mellitus, type II (HCC) 06/30/2011   Heart murmur 06/13/2011    Palliative Care Assessment & Plan    Recommendations/Plan: Son requests someone call him the day after Christmas to discuss decision on resuming hospice at D/C.  If son chooses to resume hospice, would recommend TOC to engage the agency of their choice.  PMT will shadow for needs.   Code Status:    Code Status Orders  (From admission, onward)           Start     Ordered   05/19/23 1814  Do not attempt resuscitation (DNR)- Limited -Do Not Intubate (DNI)  (Code Status)  Continuous       Question Answer Comment  If pulseless and not breathing No CPR or chest compressions.   In Pre-Arrest Conditions (Patient Is Breathing and Has A Pulse) Do not intubate. Provide all appropriate non-invasive medical interventions. Avoid ICU transfer unless indicated or required.   Consent: Discussion documented in EHR or  advanced directives reviewed      05/19/23 1813           Code Status History     Date Active Date Inactive Code Status Order ID Comments User Context   05/19/2023 1738 05/19/2023 1813 Full Code [id number]  Loyce Dys, MD ED   05/18/2021 0400 05/19/2021 1714 Full Code [id number]  Andris Baumann, MD ED   04/18/2020 1234 04/21/2020 1945 Full Code [id number]  Lucile Shutters, MD  ED   07/26/2017 2152 07/28/2017 1400 Full Code [id number]  Salary, Evelena Asa, MD Inpatient   05/21/2016 2002 05/24/2016 1846 Full Code [id number]  Ramonita Lab, MD Inpatient   02/22/2016 1902 02/23/2016 1449 Full Code [id number]  Auburn Bilberry, MD ED   05/07/2015 1943 05/08/2015 1908 Full Code [id number]  Adrian Saran, MD Inpatient   05/02/2015 1648 05/04/2015 2014 Full Code [id number]  Altamese Dilling, MD Inpatient   05/02/2015 1531 05/02/2015 1648 Full Code [id number]  Iran Ouch, MD Inpatient   11/23/2014 2242 11/25/2014 1529 Full Code [id number]  Shaune Pollack, MD Inpatient    Thank you for allowing the Palliative Medicine Team to assist in the care of this patient.  Morton Stall, NP  Please contact Palliative Medicine Team phone at [phone] for questions and concerns.

## 2023-06-02 NOTE — Progress Notes (Signed)
Progress Note   Patient: Theresa [name] XIP:[id number] DOB: 02-07-1956 DOA: 05/19/2023     14 DOS: the patient was seen and examined on 06/02/2023     Brief hospital course: HPI on admission: "Theresa [name] is a 67 y.o. female with medical history significant of  Anemia of chronic disease, COPD coronary artery disease status post stent placement, Diabetes mellitus with diabetic neuropathy and retinopathy, previous history of ESRD on hemodialysis currently status post transplant, hyperlipidemia, Hypertension moderate to severe mitral regurgitation, peripheral vascular disease who is a resident in a group home brought in on account of worsening gangrene noted on the right foot.  ..." See H&P for full HPI on admission & ED course.   Patient was admitted to the hospital, started on IV heparin and Vascular Surgery was consulted, plan for angiogram today (12/11).   Further hospital course and management as outlined below.     Assessment and Plan:   Right foot gangrene, s/p AKA done on 12/12 Vascular surgeon on board and case discussed On 05/20/2023 patient underwent angiogram with angioplasty to right SFA and popliteal arteries Patient underwent a right AKA on 05/21/2023 Continue Plavix Continue PT OT    Thrombocytopenia -- appears acute on chronic. Worsening since admission, platelets now in 70's --held Lovenox and resumed after improvement in the platelet count --Also on Plavix - continue for now --Daily CBC's to monitor Plts 142 stable   Hypokalemia - resolved with replacement --Monitor BMP, replace K PRN   Hyperkalemia - K 5.8 on 12/12 treated and resolved --Monitor BMP   Hypovolemic hyponatremia - mild, Na 132 on admission.   Pt appeared clinically dehydrated. --Encourage oral hydration --Monitor BMP   Anemia of chronic disease Status post 1 unit blood transfusion Monitor CBC closely   COPD - stable, not exacerbated --PRN bronchodilators   Coronary artery disease  status post stent placement --Continue statin therapy   Diabetes mellitus with diabetic neuropathy and retinopathy --Monitor CBG's --Sliding scale Novolog --Continue Lyrica   Hypoglycemia - CBG noted 44 on evening of 12/13.  Treated an improved to 175 --Hypoglycemia protocol   AKI in a patient with history of ESRD  Patient used to be on on hemodialysis currently status post transplant No longer on hemodialysis Renal function stable at baseline --On tacrolimus and azathioprine Plan of care discussed with nephrologist Placed on IV fluid therapy   Severe hypoalbuminemia: Continue protein supplement Dietitian consulted   Hyperlipidemia: Continue Lipitor   Hypertension:  Continue current antihypertensives   Vitamin D insufficiency: Continue vitamin D supplementation   Moderate to severe mitral regurgitation Continue to monitor volume status   Peripheral vascular disease, now s/p R AKA Continue on Plavix per vascular    Goals of care discussion, palliative care consulted on 12/20 Overall poor prognosis due to above comorbidities   Psych consulted to evaluate for decision-making capacity. Follow-up psych recommendation   Body mass index is 22.44 kg/m. DVT prophylaxis: SCD due to thrombocytopenia, Plts improved Lenoxaparin (LOVENOX) injection 40 mg Start: 05/28/23 2200   Subjective:  Patient seen and examined at bedside this morning Patient arousable but less interactive today Noted to have decreased urine output in uptrending creatinine Nephrologist has initiated IV fluid therapy   Physical Exam: General exam: NAD, lying comfortably. Looks chronically ill appearing HEENT: mucus membranes dry, hearing grossly normal  Respiratory system: lungs clear no wheezes or rhonchi, on room air, normal respiratory effort. Cardiovascular system: normal S1/S2, RRR, no pedal edema.   Gastrointestinal system: soft, NT,  ND Central nervous system: A&O x. no gross focal neurologic  deficits, normal speech Extremities: s/p right AKA, dressing CDI Psychiatry: mood depressed, affect sad face and sleepy      Family Communication: None present on rounds.      Disposition: Status is: Inpatient Remains inpatient appropriate because: needs SNF placement. TOC working on days   Planned Discharge Destination: SNF placement when bed will be available.   enoxaparin (LOVENOX) injection 40 mg Start: 05/28/23 2200   Data Reviewed:    Latest Ref Rng & Units 06/02/2023    6:43 AM 06/01/2023    6:56 AM 05/31/2023    5:38 PM  CBC  WBC 4.0 - 10.5 K/uL 5.4  5.3    Hemoglobin 12.0 - 15.0 g/dL 8.3  8.5  8.3   Hematocrit 36.0 - 46.0 % 25.8  26.6  26.0   Platelets 150 - 400 K/uL 183  155         Latest Ref Rng & Units 06/02/2023    6:43 AM 06/01/2023    6:56 AM 05/31/2023    5:21 AM  BMP  Glucose 70 - 99 mg/dL 93  95  92   BUN 8 - 23 mg/dL 28  23  22    Creatinine 0.44 - 1.00 mg/dL 4.40  3.47  4.25   Sodium 135 - 145 mmol/L 136  136  136   Potassium 3.5 - 5.1 mmol/L 4.7  4.5  4.5   Chloride 98 - 111 mmol/L 108  109  110   CO2 22 - 32 mmol/L 20  20  20    Calcium 8.9 - 10.3 mg/dL 8.5  8.3  8.2      Vitals:   06/02/23 0411 06/02/23 0750 06/02/23 1141 06/02/23 1418  BP: (!) 157/41 (!) 157/50 (!) 167/47 (!) 176/54  Pulse: 61 65 (!) 59 69  Resp: 16 18 17 18   Temp: 98.8 F (37.1 C) 97.8 F (36.6 C) 98.6 F (37 C) 98 F (36.7 C)  TempSrc: Axillary     SpO2: 100% 100% 100% 99%  Weight:      Height:        Time spent: 41 minutes  Author: Loyce Dys, MD 06/02/2023 6:05 PM  For on call review [email or web address].

## 2023-06-02 NOTE — Progress Notes (Signed)
[facility]  ROUNDING NOTE   Subjective:   Patient seen resting in bed Alert Patient states she is not hungry Patient encouraged to drink Nepro shakes, patient states she is not hungry for those either  Creatinine 1.66  Objective:  Vital signs in last 24 hours:  Temp:  [97.8 F (36.6 C)-99.2 F (37.3 C)] 98.6 F (37 C) (12/24 1141) Pulse Rate:  [59-65] 59 (12/24 1141) Resp:  [16-18] 17 (12/24 1141) BP: (151-167)/(41-88) 167/47 (12/24 1141) SpO2:  [98 %-100 %] 100 % (12/24 1141)  Weight change:  Filed Weights   05/19/23 1643 05/20/23 1234 05/21/23 1019  Weight: 66.2 kg 65 kg 65 kg    Intake/Output: I/O last 3 completed shifts: In: -  Out: 400 [Urine:400]   Intake/Output this shift:  No intake/output data recorded.  Physical Exam: General: NAD, laying in bed Withdrawn  Head: Normocephalic, atraumatic. Moist oral mucosal membranes  Eyes: Anicteric  Lungs:  Clear to auscultation, normal effort  Heart: Regular rate and rhythm  Abdomen:  Soft, nontender  Extremities: No peripheral edema.  Rt AKA on 05/21/23. Dressing clean, dry and intact  Neurologic: Alert, oriented to self and place  Skin: No lesions  Access: None    Basic Metabolic Panel: Recent Labs  Lab 05/27/23 0538 05/28/23 0623 05/29/23 0627 05/30/23 0434 05/31/23 0521 06/01/23 0656 06/02/23 0643  NA 136 134* 137 135 136 136 136  K 4.9 4.5 4.7 4.9 4.5 4.5 4.7  CL 106 105 107 108 110 109 108  CO2 23 21* 23 21* 20* 20* 20*  GLUCOSE 96 98 94 90 92 95 93  BUN 21 19 22 21 22 23  28*  CREATININE 1.18* 1.09* 1.35* 1.34* 1.38* 1.51* 1.66*  CALCIUM 8.5* 8.4* 8.2* 8.0* 8.2* 8.3* 8.5*  MG 2.1 2.1 2.1  --   --  2.4 2.4  PHOS 2.7 3.2 3.4  --   --  3.4 3.6    Liver Function Tests: No results for input(s): "AST", "ALT", "ALKPHOS", "BILITOT", "PROT", "ALBUMIN" in the last 168 hours.  No results for input(s): "LIPASE", "AMYLASE" in the last 168 hours. No results for input(s): "AMMONIA" in the  last 168 hours.  CBC: Recent Labs  Lab 05/29/23 0627 05/30/23 0434 05/31/23 0521 05/31/23 1738 06/01/23 0656 06/02/23 0643  WBC 5.3 6.0 5.2  --  5.3 5.4  HGB 8.3* 7.6* 7.2* 8.3* 8.5* 8.3*  HCT 25.9* 22.9* 22.3* 26.0* 26.6* 25.8*  MCV 102.0* 99.6 101.8*  --  98.5 96.6  PLT 117* 142* 139*  --  155 183    Cardiac Enzymes: No results for input(s): "CKTOTAL", "CKMB", "CKMBINDEX", "TROPONINI" in the last 168 hours.  BNP: Invalid input(s): "POCBNP"  CBG: Recent Labs  Lab 06/01/23 1303 06/01/23 1718 06/01/23 2046 06/02/23 0752 06/02/23 1139  GLUCAP 161* 129* 121* 96 108*    Microbiology: Results for orders placed or performed during the hospital encounter of 05/19/23  Surgical pcr screen     Status: None   Collection Time: 05/21/23  3:34 AM   Specimen: Nasal Mucosa; Nasal Swab  Result Value Ref Range Status   MRSA, PCR NEGATIVE NEGATIVE Final   Staphylococcus aureus NEGATIVE NEGATIVE Final    Comment: (NOTE) The Xpert SA Assay (FDA approved for NASAL specimens in patients 22 years of age and older), is one component of a comprehensive surveillance program. It is not intended to diagnose infection nor to guide or monitor treatment. Performed at [facility], [address], [place], [place]  [address]     Coagulation Studies: No results for input(s): "LABPROT", "INR" in the last 72 hours.   Urinalysis: No results for input(s): "COLORURINE", "LABSPEC", "PHURINE", "GLUCOSEU", "HGBUR", "BILIRUBINUR", "KETONESUR", "PROTEINUR", "UROBILINOGEN", "NITRITE", "LEUKOCYTESUR" in the last 72 hours.  Invalid input(s): "APPERANCEUR"    Imaging: No results found.    Medications:    sodium chloride 50 mL/hr at 06/02/23 1041     acetaminophen  650 mg Oral Q8H   vitamin C  500 mg Oral BID   atorvastatin  20 mg Oral Daily   azaTHIOprine  100 mg Oral Daily   Chlorhexidine Gluconate Cloth  6 each Topical Daily   cloNIDine  0.1 mg Oral BID   clopidogrel  75 mg  Oral Daily   vitamin B-12  500 mcg Oral Daily   enoxaparin (LOVENOX) injection  40 mg Subcutaneous Q24H   feeding supplement  1 Container Oral TID BM   folic acid  1 mg Oral Daily   insulin aspart  0-5 Units Subcutaneous QHS   insulin aspart  0-6 Units Subcutaneous TID WC   isosorbide mononitrate  60 mg Oral Daily   leptospermum manuka honey  1 Application Topical Daily   losartan  25 mg Oral Daily   metoprolol tartrate  50 mg Oral BID   multivitamin with minerals  1 tablet Oral Daily   OLANZapine zydis  10 mg Oral QHS   pantoprazole  40 mg Oral Daily   pregabalin  75 mg Oral BID   sodium bicarbonate  650 mg Oral TID   tacrolimus ER  10 mg Oral QAC breakfast   traZODone  150 mg Oral QHS   Vitamin D (Ergocalciferol)  50,000 Units Oral Q7 days   zinc sulfate (50mg  elemental zinc)  220 mg Oral Daily   haloperidol lactate, hydrALAZINE, hydrALAZINE, ipratropium-albuterol, morphine injection, mouth rinse, oxyCODONE  Assessment/ Plan:  Ms. ARDATH LYKKEN is a 67 y.o.  female with past medical conditions including COPD, anemia, diabetes, CAD, hyperlipidemia, status post renal transplant, hypertension, who was admitted to [facility] on 05/19/2023 for Arterial occlusion [I70.90] Diabetes mellitus with foot ulcer and gangrene (HCC) [U04.540, L97.509, E11.52] Gangrene of right foot (HCC) [I96]   Chronic kidney disease stage IIIb, status post cadaver renal transplant completed at [facility] on 03/30/2019.  Baseline creatinine 1-1.5.   - Immunosuppressive medications: tacrolimus and azothiaprine.   -Creatinine slightly elevated today - Will order maintenance 0.9% normal saline at 50 mL/h and patient encouraged to increase oral intake. - Tacrolimus level drawn on 05/29/2023.   Lab Results  Component Value Date   CREATININE 1.66 (H) 06/02/2023   CREATININE 1.51 (H) 06/01/2023   CREATININE 1.38 (H) 05/31/2023   No intake or output data in the 24 hours ending 06/02/23 1238    2.  Hypertension with  chronic kidney disease.  Home regimen includes furosemide, clonidine, and isosorbide.  Furosemide currently held.  Blood pressure except double, 157/50.  3. Anemia of chronic kidney disease Lab Results  Component Value Date   HGB 8.3 (L) 06/02/2023   Patient has received blood transfusions during this admission.  Hemoglobin remains decreased but improved, 8.3.   LOS: 14 Eduardo Wurth 12/24/202412:38 PM

## 2023-06-02 NOTE — Progress Notes (Signed)
Physical Therapy Treatment Patient Details Name: Theresa [name] MRN: [id number] DOB: 03-15-1956 Today's Date: 06/02/2023   History of Present Illness Pt is s/p R AKA 2/2 R foot and lower leg gangrene. PMH of CAD s/p stent, HTN, COPD, MI, CHF, dementia, DMII, previous ESRD now s/p transplant, HLD, PVD.    PT Comments  Pt somewhat lethargic during the session but improved with min to mod verbal and tactile stimulus. Pt required near total assist with bed mobility tasks and once in sitting at the EOB presented with min to mod posterior lean that only temporarily improved with anterior weight shifting activities.  Pt communicated verbally during the session but confused and followed very few commands and when she did it was only with max multi-modal cuing.  Pt will benefit from a trial of continued PT services upon discharge to safely address deficits listed in patient problem list for decreased caregiver assistance and eventual return to PLOF.    If plan is discharge home, recommend the following: Two people to help with bathing/dressing/bathroom;Two people to help with walking and/or transfers;Direct supervision/assist for medications management;Help with stairs or ramp for entrance;Assist for transportation;Assistance with cooking/housework;Assistance with feeding;Direct supervision/assist for financial management;Supervision due to cognitive status   Can travel by private vehicle     No  Equipment Recommendations  Other (comment) (TBD at next venue of care)    Recommendations for Other Services       Precautions / Restrictions Precautions Precautions: Fall Restrictions Weight Bearing Restrictions Per Provider Order: Yes RLE Weight Bearing Per Provider Order: Non weight bearing Other Position/Activity Restrictions: R AKA     Mobility  Bed Mobility Overal bed mobility: Needs Assistance Bed Mobility: Supine to Sit, Sit to Supine     Supine to sit: +2 for physical assistance, Max  assist Sit to supine: Max assist, +2 for physical assistance   General bed mobility comments: Pt put forth very little effort with bed mobility tasks this session needing near total assist    Transfers                   General transfer comment: unsafe to attempt    Ambulation/Gait                   Stairs             Wheelchair Mobility     Tilt Bed    Modified Rankin (Stroke Patients Only)       Balance Overall balance assessment: Needs assistance Sitting-balance support: Single extremity supported Sitting balance-Leahy Scale: Poor Sitting balance - Comments: Near constant assist to prevent posterior LOB this session Postural control: Posterior lean                                  Cognition Arousal: Lethargic Behavior During Therapy: WFL for tasks assessed/performed, Agitated Overall Cognitive Status: History of cognitive impairments - at baseline                                          Exercises Other Exercises Other Exercises: Static sitting at EOB for core therex and improved activity tolerance Other Exercises: Anterior weight shifting activities in sitting at EOB to address posterior lean    General Comments        Pertinent Vitals/Pain Pain Assessment Pain  Assessment: PAINAD Breathing: normal Negative Vocalization: none Facial Expression: smiling or inexpressive Body Language: relaxed Consolability: no need to console PAINAD Score: 0 Pain Intervention(s): Monitored during session    Home Living                          Prior Function            PT Goals (current goals can now be found in the care plan section) Progress towards PT goals: Not progressing toward goals - comment (limited by cognition)    Frequency    Min 1X/week      PT Plan      Co-evaluation              AM-PAC PT "6 Clicks" Mobility   Outcome Measure  Help needed turning from your back to  your side while in a flat bed without using bedrails?: A Little Help needed moving from lying on your back to sitting on the side of a flat bed without using bedrails?: A Lot Help needed moving to and from a bed to a chair (including a wheelchair)?: Total Help needed standing up from a chair using your arms (e.g., wheelchair or bedside chair)?: Total Help needed to walk in hospital room?: Total Help needed climbing 3-5 steps with a railing? : Total 6 Click Score: 9    End of Session   Activity Tolerance: Patient tolerated treatment well Patient left: in bed;with call bell/phone within reach;with bed alarm set Nurse Communication: Mobility status PT Visit Diagnosis: Other abnormalities of gait and mobility (R26.89);Difficulty in walking, not elsewhere classified (R26.2);Muscle weakness (generalized) (M62.81);Pain Pain - Right/Left: Right Pain - part of body: Leg     Time: 8119-1478 PT Time Calculation (min) (ACUTE ONLY): 17 min  Charges:    $Therapeutic Activity: 8-22 mins PT General Charges $$ ACUTE PT VISIT: 1 Visit                     D. Scott Madline Oesterling PT, DPT 06/02/23, 2:50 PM

## 2023-06-02 NOTE — Final Consult Note (Cosign Needed Addendum)
Consultant Final Sign-Off Note    Assessment/Final recommendations  Theresa [name] is a 67 y.o. female followed by me for agitation and capacity. She has medical history of dementia. She resides in a group home  with guardian as Theresa [name] [phone]. The in person consult was 30 min, with the patient being irritable and not answering questions.  Nurse report states that this is consistent with aggression toward staff. With past medical history and current behavior, pt has no capacity to make healthcare decisions, please call guardian listed.     Wound care (if applicable):    Diet at discharge: per primary team   Activity at discharge: per primary team   Follow-up appointment:     Pending results:  Unresulted Labs (From admission, onward)     Start     Ordered   06/02/23 0500  Tacrolimus level  Tomorrow morning,   R       Question:  Specimen collection method  Answer:  Lab=Lab collect   06/01/23 0826   06/01/23 0500  Basic metabolic panel  Daily,   R     Question:  Specimen collection method  Answer:  Lab=Lab collect   05/31/23 0915   06/01/23 0500  CBC  Daily,   R     Question:  Specimen collection method  Answer:  Lab=Lab collect   05/31/23 0915   06/01/23 0500  Magnesium  Daily,   R     Question:  Specimen collection method  Answer:  Lab=Lab collect   05/31/23 0915   06/01/23 0500  Phosphorus  Daily,   R     Question:  Specimen collection method  Answer:  Lab=Lab collect   05/31/23 0915             Medication recommendations:   Other recommendations:    Thank you for allowing [place] to participate in the care of your patient!  Please consult [place] again if you have further needs for your patient.  Jerlyn Ly Lillyonna Armstead 06/02/2023 10:04 AM    Subjective   Theresa [name] is a 67 y.o. female patient admitted with complications of diabetes.  She resides at a group home and underwent below the knee amputation.  She is very irritable but she does deny being  depressed. During capacity screening she was unable to ask questions, and remained irritable.  Per Dr. Marlou Porch from 12/19, she could probably benefit from something like Zyprexa that would help with her mood and agitation at the same time instead of having 2 separate medications.  She obviously has vascular dementia, too.  Due to her known medical history and current presentation, she is does not have capacity to make healthcare decisions.   Listed GuardiansJoscelyne [name] - Son - [phone] Theresa [name] - Daughter - [phone]  Objective  Vital signs in last 24 hours: Temp:  [97.8 F (36.6 C)-99.2 F (37.3 C)] 97.8 F (36.6 C) (12/24 0750) Pulse Rate:  [61-65] 65 (12/24 0750) Resp:  [16-18] 18 (12/24 0750) BP: (151-162)/(41-88) 157/50 (12/24 0750) SpO2:  [98 %-100 %] 100 % (12/24 0750)  Pertinent labs and Studies: Recent Labs    05/31/23 0521 05/31/23 1738 06/01/23 0656 06/02/23 0643  WBC 5.2  --  5.3 5.4  HGB 7.2* 8.3* 8.5* 8.3*  HCT 22.3* 26.0* 26.6* 25.8*   BMET Recent Labs    06/01/23 0656 06/02/23 0643  NA 136 136  K 4.5 4.7  CL 109 108  CO2 20* 20*  GLUCOSE 95  93  BUN 23 28*  CREATININE 1.51* 1.66*  CALCIUM 8.3* 8.5*   No results for input(s): "LABURIN" in the last 72 hours. Results for orders placed or performed during the hospital encounter of 05/19/23  Surgical pcr screen     Status: None   Collection Time: 05/21/23  3:34 AM   Specimen: Nasal Mucosa; Nasal Swab  Result Value Ref Range Status   MRSA, PCR NEGATIVE NEGATIVE Final   Staphylococcus aureus NEGATIVE NEGATIVE Final    Comment: (NOTE) The Xpert SA Assay (FDA approved for NASAL specimens in patients 64 years of age and older), is one component of a comprehensive surveillance program. It is not intended to diagnose infection nor to guide or monitor treatment. Performed at [facility], [address], [place], [place] [address]     Imaging: No results found.  Treatment  Plan Summary:  Agitation and Depression Continue Zyprexa 5 mg twice a day for depression and agitation.   2. Continue Trazadone 150mg  oral at bedtime.   3. Discontinue the Seroquel as it will not help with depression as much as Zyprexa.    Capacity Patient does not meet capacity for decision making, please seek listed guardian.   Danise Mina, NP 06/02/23 1021

## 2023-06-03 DIAGNOSIS — E11621 Type 2 diabetes mellitus with foot ulcer: Secondary | ICD-10-CM | POA: Diagnosis not present

## 2023-06-03 DIAGNOSIS — L97509 Non-pressure chronic ulcer of other part of unspecified foot with unspecified severity: Secondary | ICD-10-CM | POA: Diagnosis not present

## 2023-06-03 DIAGNOSIS — E1152 Type 2 diabetes mellitus with diabetic peripheral angiopathy with gangrene: Secondary | ICD-10-CM | POA: Diagnosis not present

## 2023-06-03 LAB — GLUCOSE, CAPILLARY
Glucose-Capillary: 100 mg/dL — ABNORMAL HIGH (ref 70–99)
Glucose-Capillary: 123 mg/dL — ABNORMAL HIGH (ref 70–99)
Glucose-Capillary: 108 mg/dL — ABNORMAL HIGH (ref 70–99)
Glucose-Capillary: 81 mg/dL (ref 70–99)

## 2023-06-03 LAB — BASIC METABOLIC PANEL
Anion gap: 7 (ref 5–15)
BUN: 28 mg/dL — ABNORMAL HIGH (ref 8–23)
CO2: 21 mmol/L — ABNORMAL LOW (ref 22–32)
Calcium: 8.3 mg/dL — ABNORMAL LOW (ref 8.9–10.3)
Chloride: 109 mmol/L (ref 98–111)
Creatinine, Ser: 1.53 mg/dL — ABNORMAL HIGH (ref 0.44–1.00)
GFR, Estimated: 38 mL/min — ABNORMAL LOW (ref >60)
Glucose, Bld: 91 mg/dL (ref 70–99)
Potassium: 4.7 mmol/L (ref 3.5–5.1)
Sodium: 137 mmol/L (ref 135–145)

## 2023-06-03 LAB — PHOSPHORUS: Phosphorus: 3.5 mg/dL (ref 2.5–4.6)

## 2023-06-03 LAB — CBC
HCT: 27 % — ABNORMAL LOW (ref 36.0–46.0)
Hemoglobin: 8.6 g/dL — ABNORMAL LOW (ref 12.0–15.0)
MCH: 32 pg (ref 26.0–34.0)
MCHC: 31.9 g/dL (ref 30.0–36.0)
MCV: 100.4 fL — ABNORMAL HIGH (ref 80.0–100.0)
Platelets: 191 10*3/uL (ref 150–400)
RBC: 2.69 MIL/uL — ABNORMAL LOW (ref 3.87–5.11)
RDW: 18.7 % — ABNORMAL HIGH (ref 11.5–15.5)
WBC: 4.4 10*3/uL (ref 4.0–10.5)
nRBC: 0 % (ref 0.0–0.2)

## 2023-06-03 LAB — MAGNESIUM: Magnesium: 2.4 mg/dL (ref 1.7–2.4)

## 2023-06-03 NOTE — Progress Notes (Signed)
Progress Note   Patient: Theresa [name] DOB: [birth year] DOA: 05/19/2023     15 DOS: the patient was seen and examined on 06/03/2023     Brief hospital course: HPI on admission: "Theresa [name] is a 67 y.o. female with medical history significant of  Anemia of chronic disease, COPD coronary artery disease status post stent placement, Diabetes mellitus with diabetic neuropathy and retinopathy, previous history of ESRD on hemodialysis currently status post transplant, hyperlipidemia, Hypertension moderate to severe mitral regurgitation, peripheral vascular disease who is a resident in a group home brought in on account of worsening gangrene noted on the right foot.  ..." See H&P for full HPI on admission & ED course.   Patient was admitted to the hospital, started on IV heparin and Vascular Surgery was consulted, plan for angiogram today (12/11).   Further hospital course and management as outlined below.     Assessment and Plan:   Right foot gangrene, s/p AKA done on 12/12 Vascular surgeon on board and case discussed On 05/20/2023 patient underwent angiogram with angioplasty to right SFA and popliteal arteries Patient underwent a right AKA on 05/21/2023 Continue Plavix Continue PT OT     Thrombocytopenia -- appears acute on chronic. Worsening since admission, platelets now in 70's --held Lovenox and resumed after improvement in the platelet count --Also on Plavix - continue for now --Daily CBC's to monitor Plts 142 stable   Hypokalemia - resolved with replacement --Monitor BMP, replace K PRN   Hyperkalemia - K 5.8 on 12/12 treated and resolved --Monitor BMP   Hypovolemic hyponatremia - mild, Na 132 on admission.   Pt appeared clinically dehydrated. --Encourage oral hydration --Monitor BMP   Anemia of chronic disease Status post 1 unit blood transfusion Monitor CBC closely   COPD - stable, not exacerbated --PRN bronchodilators   Coronary artery disease  status post stent placement --Continue statin therapy   Diabetes mellitus with diabetic neuropathy and retinopathy --Monitor CBG's --Sliding scale Novolog Continue Lyrica   Hypoglycemia - CBG noted 44 on evening of 12/13.  Treated an improved to 175 --Hypoglycemia protocol   AKI in a patient with history of ESRD  Patient used to be on on hemodialysis currently status post transplant No longer on hemodialysis Renal function stable at baseline --On tacrolimus and azathioprine Plan of care discussed with nephrologist Placed on IV fluid therapy   Severe hypoalbuminemia: Continue protein supplement Dietitian consulted   Hyperlipidemia: Continue Lipitor   Hypertension:  Continue current antihypertensives   Vitamin D insufficiency: Continue vitamin D supplementation   Moderate to severe mitral regurgitation Continue to monitor volume status   Peripheral vascular disease, now s/p R AKA Continue on Plavix per vascular    Goals of care discussion, palliative care consulted on 12/20 Overall poor prognosis due to above comorbidities   Psych consulted to evaluate for decision-making capacity. Follow-up psych recommendation   Body mass index is 22.44 kg/m. DVT prophylaxis: SCD due to thrombocytopenia, Plts improved Lenoxaparin (LOVENOX) injection 40 mg Start: 05/28/23 2200   Subjective:  Continues to appear critically ill Mental status have improved today Denies abdominal pain worsening leg pain nausea vomiting Renal function improving   Physical Exam: General exam: NAD, lying comfortably. Looks chronically ill appearing HEENT: mucus membranes dry, hearing grossly normal  Respiratory system: lungs clear no wheezes or rhonchi, on room air, normal respiratory effort. Cardiovascular system: normal S1/S2, RRR, no pedal edema.   Gastrointestinal system: soft, NT, ND Central nervous system: A&O x. no  gross focal neurologic deficits, normal speech Extremities: s/p right AKA,  dressing CDI Psychiatry: mood depressed, affect sad face and sleepy      Family Communication: None present on rounds.      Disposition: Status is: Inpatient Remains inpatient appropriate because: needs SNF placement. TOC working on days   Planned Discharge Destination: SNF placement when bed will be available.   enoxaparin (LOVENOX) injection 40 mg Start: 05/28/23 2200   Data Reviewed:    Latest Ref Rng & Units 06/03/2023    5:10 AM 06/02/2023    6:43 AM 06/01/2023    6:56 AM  CBC  WBC 4.0 - 10.5 K/uL 4.4  5.4  5.3   Hemoglobin 12.0 - 15.0 g/dL 8.6  8.3  8.5   Hematocrit 36.0 - 46.0 % 27.0  25.8  26.6   Platelets 150 - 400 K/uL 191  183  155        Latest Ref Rng & Units 06/03/2023    5:10 AM 06/02/2023    6:43 AM 06/01/2023    6:56 AM  BMP  Glucose 70 - 99 mg/dL 91  93  95   BUN 8 - 23 mg/dL 28  28  23    Creatinine 0.44 - 1.00 mg/dL 1.61  0.96  0.45   Sodium 135 - 145 mmol/L 137  136  136   Potassium 3.5 - 5.1 mmol/L 4.7  4.7  4.5   Chloride 98 - 111 mmol/L 109  108  109   CO2 22 - 32 mmol/L 21  20  20    Calcium 8.9 - 10.3 mg/dL 8.3  8.5  8.3       Vitals:   06/03/23 0230 06/03/23 0347 06/03/23 0801 06/03/23 1153  BP: (!) 148/47 (!) 145/40 (!) 163/75 (!) 153/51  Pulse:  (!) 59 65 66  Resp:  16 16 16   Temp:  98.3 F (36.8 C) 98 F (36.7 C) 97.8 F (36.6 C)  TempSrc:      SpO2:  100% 100% 98%  Weight:      Height:         Author: Loyce Dys, MD 06/03/2023 4:36 PM  For on call review [email or web address].

## 2023-06-03 NOTE — TOC Progression Note (Addendum)
Transition of Care [facility]) - Progression Note    Patient Details  Name: Theresa [name] MRN: [id number] Date of Birth: [birth year]  [facility] CM[facility]SW Contact  Carmina Miller, [place] Phone Number: 06/03/2023, 1:43 PM  Clinical Narrative:     CSW spoke with pt's granddaughter [name], explained SNF recs, she is agreeable, explained insurance auth process and Medicare.gov ratings website. Discussed current bed offers and texted them to [name] as well. TOC will continue to follow.        Expected Discharge Plan and Services                                               Social Determinants of Health (SDOH) Interventions SDOH Screenings   Food Insecurity: Patient Unable To Answer (05/20/2023)  Housing: High Risk (05/20/2023)  Transportation Needs: Patient Unable To Answer (05/20/2023)  Utilities: Patient Unable To Answer (05/20/2023)  Alcohol Screen: Low Risk  (12/17/2022)  Depression (PHQ2-9): Medium Risk (12/31/2022)  Financial Resource Strain: Low Risk  (02/13/2023)   Received from [facility]  Physical Activity: Inactive (12/17/2022)  Social Connections: Socially Isolated (12/17/2022)  Stress: No Stress Concern Present (12/17/2022)  Tobacco Use: Medium Risk (05/21/2023)  Health Literacy: Low Risk  (09/13/2020)   Received from [facility], [facility]    Readmission Risk Interventions     No data to display

## 2023-06-03 NOTE — Progress Notes (Signed)
[facility]  ROUNDING NOTE   Subjective:   Patient resting comfortably.  Cr down slightly today.  On IVF hydration.   Objective:  Vital signs in last 24 hours:  Temp:  [97.8 F (36.6 C)-98.3 F (36.8 C)] 97.8 F (36.6 C) (12/25 1153) Pulse Rate:  [59-67] 66 (12/25 1153) Resp:  [16-17] 16 (12/25 1153) BP: (145-190)/(40-75) 153/51 (12/25 1153) SpO2:  [98 %-100 %] 98 % (12/25 1153)  Weight change:  Filed Weights   05/19/23 1643 05/20/23 1234 05/21/23 1019  Weight: 66.2 kg 65 kg 65 kg    Intake/Output: I/O last 3 completed shifts: In: 215.3 [I.V.:215.3] Out: -    Intake/Output this shift:  No intake/output data recorded.  Physical Exam: General: NAD, laying in bed  Head: Normocephalic, atraumatic. Moist oral mucosal membranes  Eyes: Anicteric  Lungs:  Clear to auscultation, normal effort  Heart: Regular rate and rhythm  Abdomen:  Soft, nontender  Extremities: No peripheral edema.  Rt AKA on 05/21/23.  Neurologic: Alert, oriented to self and place  Skin: No lesions  Access: None    Basic Metabolic Panel: Recent Labs  Lab 05/28/23 0623 05/29/23 0627 05/30/23 0434 05/31/23 0521 06/01/23 0656 06/02/23 0643 06/03/23 0510  NA 134* 137 135 136 136 136 137  K 4.5 4.7 4.9 4.5 4.5 4.7 4.7  CL 105 107 108 110 109 108 109  CO2 21* 23 21* 20* 20* 20* 21*  GLUCOSE 98 94 90 92 95 93 91  BUN 19 22 21 22 23  28* 28*  CREATININE 1.09* 1.35* 1.34* 1.38* 1.51* 1.66* 1.53*  CALCIUM 8.4* 8.2* 8.0* 8.2* 8.3* 8.5* 8.3*  MG 2.1 2.1  --   --  2.4 2.4 2.4  PHOS 3.2 3.4  --   --  3.4 3.6 3.5    Liver Function Tests: No results for input(s): "AST", "ALT", "ALKPHOS", "BILITOT", "PROT", "ALBUMIN" in the last 168 hours.  No results for input(s): "LIPASE", "AMYLASE" in the last 168 hours. No results for input(s): "AMMONIA" in the last 168 hours.  CBC: Recent Labs  Lab 05/30/23 0434 05/31/23 0521 05/31/23 1738 06/01/23 0656 06/02/23 0643 06/03/23 0510  WBC  6.0 5.2  --  5.3 5.4 4.4  HGB 7.6* 7.2* 8.3* 8.5* 8.3* 8.6*  HCT 22.9* 22.3* 26.0* 26.6* 25.8* 27.0*  MCV 99.6 101.8*  --  98.5 96.6 100.4*  PLT 142* 139*  --  155 183 191    Cardiac Enzymes: No results for input(s): "CKTOTAL", "CKMB", "CKMBINDEX", "TROPONINI" in the last 168 hours.  BNP: Invalid input(s): "POCBNP"  CBG: Recent Labs  Lab 06/02/23 1139 06/02/23 1725 06/02/23 2055 06/03/23 0800 06/03/23 1153  GLUCAP 108* 113* 88 100* 123*    Microbiology: Results for orders placed or performed during the hospital encounter of 05/19/23  Surgical pcr screen     Status: None   Collection Time: 05/21/23  3:34 AM   Specimen: Nasal Mucosa; Nasal Swab  Result Value Ref Range Status   MRSA, PCR NEGATIVE NEGATIVE Final   Staphylococcus aureus NEGATIVE NEGATIVE Final    Comment: (NOTE) The Xpert SA Assay (FDA approved for NASAL specimens in patients 56 years of age and older), is one component of a comprehensive surveillance program. It is not intended to diagnose infection nor to guide or monitor treatment. Performed at [facility], [address] [place], [place] [address]     Coagulation Studies: No results for input(s): "LABPROT", "INR" in the last 72 hours.   Urinalysis: No results  for input(s): "COLORURINE", "LABSPEC", "PHURINE", "GLUCOSEU", "HGBUR", "BILIRUBINUR", "KETONESUR", "PROTEINUR", "UROBILINOGEN", "NITRITE", "LEUKOCYTESUR" in the last 72 hours.  Invalid input(s): "APPERANCEUR"    Imaging: No results found.    Medications:    sodium chloride 50 mL/hr at 06/03/23 0700     acetaminophen  650 mg Oral Q8H   vitamin C  500 mg Oral BID   atorvastatin  20 mg Oral Daily   azaTHIOprine  100 mg Oral Daily   cloNIDine  0.1 mg Oral BID   clopidogrel  75 mg Oral Daily   vitamin B-12  500 mcg Oral Daily   enoxaparin (LOVENOX) injection  40 mg Subcutaneous Q24H   feeding supplement  1 Container Oral TID BM   folic acid  1 mg Oral Daily   insulin  aspart  0-5 Units Subcutaneous QHS   insulin aspart  0-6 Units Subcutaneous TID WC   isosorbide mononitrate  60 mg Oral Daily   leptospermum manuka honey  1 Application Topical Daily   losartan  25 mg Oral Daily   metoprolol tartrate  50 mg Oral BID   multivitamin with minerals  1 tablet Oral Daily   OLANZapine zydis  10 mg Oral QHS   pantoprazole  40 mg Oral Daily   pregabalin  75 mg Oral BID   tacrolimus ER  10 mg Oral QAC breakfast   traZODone  150 mg Oral QHS   Vitamin D (Ergocalciferol)  50,000 Units Oral Q7 days   zinc sulfate (50mg  elemental zinc)  220 mg Oral Daily   haloperidol lactate, hydrALAZINE, hydrALAZINE, ipratropium-albuterol, morphine injection, mouth rinse, oxyCODONE  Assessment/ Plan:  Theresa [name] is a 67 y.o.  female with past medical conditions including COPD, anemia, diabetes, CAD, hyperlipidemia, status post renal transplant, hypertension, who was admitted to [facility] on 05/19/2023 for Arterial occlusion [I70.90] Diabetes mellitus with foot ulcer and gangrene (HCC) [G29.528, L97.509, E11.52] Gangrene of right foot (HCC) [I96]   Chronic kidney disease stage IIIb, status post cadaver renal transplant completed at [facility] on 03/30/2019.  Baseline creatinine 1-1.5.   - Immunosuppressive medications: tacrolimus and azothiaprine.   -Creatinine down to 1.5.  Continue IV fluid hydration. -Awaiting tacrolimus level  Lab Results  Component Value Date   CREATININE 1.53 (H) 06/03/2023   CREATININE 1.66 (H) 06/02/2023   CREATININE 1.51 (H) 06/01/2023   No intake or output data in the 24 hours ending 06/03/23 1517    2.  Hypertension with chronic kidney disease.  Home regimen includes furosemide, clonidine, and isosorbide.  Furosemide currently held.  Blood pressure currently 153/51  3. Anemia of chronic kidney disease Lab Results  Component Value Date   HGB 8.6 (L) 06/03/2023   Patient has received blood transfusions during this admission.  Hemoglobin up a bit  to 8.6.   LOS: 15 Reeves Musick 12/25/20243:17 PM

## 2023-06-03 NOTE — Progress Notes (Signed)
No urinary output since beginning of the shift. Bladder scan showed 327 ml of urine in pt bladder. Pt denied discomfort, attempt to urinate unsuccessful.  Geradine Girt, MD notified. RN instructed to bladder scan pt at 5 am, and will proceed with in and out cath. If bladder volume greater than 400 ml.

## 2023-06-04 DIAGNOSIS — E1152 Type 2 diabetes mellitus with diabetic peripheral angiopathy with gangrene: Secondary | ICD-10-CM | POA: Diagnosis not present

## 2023-06-04 DIAGNOSIS — F0390 Unspecified dementia without behavioral disturbance: Secondary | ICD-10-CM

## 2023-06-04 DIAGNOSIS — E11621 Type 2 diabetes mellitus with foot ulcer: Secondary | ICD-10-CM | POA: Diagnosis not present

## 2023-06-04 DIAGNOSIS — L97509 Non-pressure chronic ulcer of other part of unspecified foot with unspecified severity: Secondary | ICD-10-CM | POA: Diagnosis not present

## 2023-06-04 LAB — BASIC METABOLIC PANEL
Anion gap: 7 (ref 5–15)
BUN: 27 mg/dL — ABNORMAL HIGH (ref 8–23)
CO2: 19 mmol/L — ABNORMAL LOW (ref 22–32)
Calcium: 8.1 mg/dL — ABNORMAL LOW (ref 8.9–10.3)
Chloride: 111 mmol/L (ref 98–111)
Creatinine, Ser: 1.35 mg/dL — ABNORMAL HIGH (ref 0.44–1.00)
GFR, Estimated: 44 mL/min — ABNORMAL LOW (ref >60)
Glucose, Bld: 78 mg/dL (ref 70–99)
Potassium: 4.7 mmol/L (ref 3.5–5.1)
Sodium: 137 mmol/L (ref 135–145)

## 2023-06-04 LAB — CBC
HCT: 25.6 % — ABNORMAL LOW (ref 36.0–46.0)
Hemoglobin: 8.2 g/dL — ABNORMAL LOW (ref 12.0–15.0)
MCH: 32.2 pg (ref 26.0–34.0)
MCHC: 32 g/dL (ref 30.0–36.0)
MCV: 100.4 fL — ABNORMAL HIGH (ref 80.0–100.0)
Platelets: 199 10*3/uL (ref 150–400)
RBC: 2.55 MIL/uL — ABNORMAL LOW (ref 3.87–5.11)
RDW: 18.6 % — ABNORMAL HIGH (ref 11.5–15.5)
WBC: 4.8 10*3/uL (ref 4.0–10.5)
nRBC: 0 % (ref 0.0–0.2)

## 2023-06-04 LAB — GLUCOSE, CAPILLARY
Glucose-Capillary: 69 mg/dL — ABNORMAL LOW (ref 70–99)
Glucose-Capillary: 104 mg/dL — ABNORMAL HIGH (ref 70–99)
Glucose-Capillary: 91 mg/dL (ref 70–99)
Glucose-Capillary: 137 mg/dL — ABNORMAL HIGH (ref 70–99)

## 2023-06-04 LAB — TACROLIMUS LEVEL: Tacrolimus (FK506) - LabCorp: 3.5 ng/mL (ref 2.0–20.0)

## 2023-06-04 NOTE — Progress Notes (Signed)
Dressing changes daily. Changed by day shift staff 06/03/2023 at 1800. Next dressing change due 06/04/2023 at 1800.

## 2023-06-04 NOTE — Progress Notes (Addendum)
Nutrition Follow-up  DOCUMENTATION CODES:   Not applicable  INTERVENTION:   -Continue regular diet -Continue MVI with minerals daily -Continue 500 mg vitamin C BID -Continue Boost Breeze po TID, each supplement provides 250 kcal and 9 grams of protein  -If aggressive care is warranted, replete vitamin A- 10,000 units vitamin A daily x 60 days -Continue Magic cup TID with meals, each supplement provides 290 kcal and 9 grams of protein  -Continue feeding assistance with meals  -Case dicussed with RN and MD; palliative care has been consulted for goals of care. If aggressive care is warranted, consider initiation of enteral nutrition support:   Initiate Osmolite 1.5 @ 20 ml/hr and increase by 10 ml every 8 hours to goal rate of 50 ml/hr.    60 ml Prosource TF BID   If no IVFs, 140 ml free water flush every 4 hours     Tube feeding regimen provides 1880 kcal (100% of needs), 95 grams of protein, and 918 ml of H2O. Total free water: 1751 ml daily   -If feedings are initiated, monitor Kg, Mg, and Phos and replete as needed secondary to refeeding risk; also add 100 mg thiamine x 7 days and MVI daily   NUTRITION DIAGNOSIS:   Increased nutrient needs related to wound healing as evidenced by estimated needs.  Ongoing  GOAL:   Patient will meet greater than or equal to 90% of their needs  Unmet  MONITOR:   PO intake, Supplement acceptance  REASON FOR ASSESSMENT:   Consult Assessment of nutrition requirement/status  ASSESSMENT:   Pt with medical history significant of   Anemia of chronic disease, COPD  coronary artery disease status post stent placement, Diabetes mellitus with diabetic neuropathy and retinopathy, previous history of ESRD on hemodialysis currently status post transplant, hyperlipidemia, Hypertension moderate to severe mitral regurgitation, peripheral vascular disease who is a resident in a group home brought in on account of worsening gangrene noted on the right  foot.  12/11- s/p Procedure(s): Lower Extremity Angiography (Right) 12/12- s/p rt AKA  Reviewed I/O's: +120 ml x 24 hours and -1.6 L since 05/21/23   Pt remains with poor oral intake. RD has discussed recommendations with team.Per RN, pt continues to refuse oral intake despite family encouragement.   Per psychiatry, pt does not have the capacity to make medical decisions. Palliative care has reached out to family; considering transitioning to hospice, but want to talk amongst themselves. Palliative care to follow-up today.  No new wt since last visit.   Medications reviewed and include vitamin C, plavix, vitamin B-12, lovenox, folic acid, zyprexa, protonix, and vitamin D.  Labs reviewed: CBGS: 69-108 (inpatient orders for glycemic control are 0-5 units insulin aspart daily at bedtime and 0-6 units insulin aspart TID with meals).    Diet Order:   Diet Order             Diet regular Room service appropriate? Yes; Fluid consistency: Thin  Diet effective now                   EDUCATION NEEDS:   Education needs have been addressed  Skin:  Skin Assessment: Skin Integrity Issues: Skin Integrity Issues:: Incisions, Stage III, Unstageable Stage III: sacrum Unstageable: lt lateral buttock x 2 Incisions: s/p rt AKA on 05/21/23  Last BM:  07/30/22  Height:   Ht Readings from Last 1 Encounters:  05/21/23 5\' 7"  (1.702 m)    Weight:   Wt Readings from Last 1 Encounters:  05/21/23 65 kg    Ideal Body Weight:  56.5 kg (adjusted for rt AKA)  BMI:  Body mass index is 22.44 kg/m.  Estimated Nutritional Needs:   Kcal:  1700-1900  Protein:  90-105 grams  Fluid:  > 1.7 L    Theresa [name], RD, LDN, CDCES Registered Dietitian III Certified Diabetes Care and Education Specialist If unable to reach this RD, please use "RD Inpatient" group chat on secure chat between hours of 8am-4 pm daily

## 2023-06-04 NOTE — Progress Notes (Signed)
[facility]  ROUNDING NOTE   Subjective:   Patient seen sitting up in bed Untouched breakfast tray at bedside Patient alert Continues to states she is not hungry  Creatinine 1.35 Nursing states patient intermittently refuses medications.  Objective:  Vital signs in last 24 hours:  Temp:  [97.9 F (36.6 C)-99.6 F (37.6 C)] 98.5 F (36.9 C) (12/26 1337) Pulse Rate:  [67-74] 70 (12/26 1337) Resp:  [16-19] 16 (12/26 1337) BP: (156-189)/(35-58) 181/52 (12/26 1337) SpO2:  [95 %-100 %] 98 % (12/26 1337)  Weight change:  Filed Weights   05/19/23 1643 05/20/23 1234 05/21/23 1019  Weight: 66.2 kg 65 kg 65 kg    Intake/Output: I/O last 3 completed shifts: In: 120 [P.O.:120] Out: -    Intake/Output this shift:  Total I/O In: 1385.5 [I.V.:1385.5] Out: -   Physical Exam: General: NAD, laying in bed  Head: Normocephalic, atraumatic. Moist oral mucosal membranes  Eyes: Anicteric  Lungs:  Clear to auscultation, normal effort  Heart: Regular rate and rhythm  Abdomen:  Soft, nontender  Extremities: No peripheral edema.  Rt AKA on 05/21/23.  Neurologic: Alert, oriented to self and place  Skin: No lesions  Access: None    Basic Metabolic Panel: Recent Labs  Lab 05/29/23 0627 05/30/23 0434 05/31/23 0521 06/01/23 0656 06/02/23 0643 06/03/23 0510 06/04/23 0540  NA 137   < > 136 136 136 137 137  K 4.7   < > 4.5 4.5 4.7 4.7 4.7  CL 107   < > 110 109 108 109 111  CO2 23   < > 20* 20* 20* 21* 19*  GLUCOSE 94   < > 92 95 93 91 78  BUN 22   < > 22 23 28* 28* 27*  CREATININE 1.35*   < > 1.38* 1.51* 1.66* 1.53* 1.35*  CALCIUM 8.2*   < > 8.2* 8.3* 8.5* 8.3* 8.1*  MG 2.1  --   --  2.4 2.4 2.4  --   PHOS 3.4  --   --  3.4 3.6 3.5  --    < > = values in this interval not displayed.    Liver Function Tests: No results for input(s): "AST", "ALT", "ALKPHOS", "BILITOT", "PROT", "ALBUMIN" in the last 168 hours.  No results for input(s): "LIPASE", "AMYLASE" in the  last 168 hours. No results for input(s): "AMMONIA" in the last 168 hours.  CBC: Recent Labs  Lab 05/31/23 0521 05/31/23 1738 06/01/23 0656 06/02/23 0643 06/03/23 0510 06/04/23 0540  WBC 5.2  --  5.3 5.4 4.4 4.8  HGB 7.2* 8.3* 8.5* 8.3* 8.6* 8.2*  HCT 22.3* 26.0* 26.6* 25.8* 27.0* 25.6*  MCV 101.8*  --  98.5 96.6 100.4* 100.4*  PLT 139*  --  155 183 191 199    Cardiac Enzymes: No results for input(s): "CKTOTAL", "CKMB", "CKMBINDEX", "TROPONINI" in the last 168 hours.  BNP: Invalid input(s): "POCBNP"  CBG: Recent Labs  Lab 06/03/23 1153 06/03/23 1645 06/03/23 2139 06/04/23 0839 06/04/23 1251  GLUCAP 123* 108* 81 69* 104*    Microbiology: Results for orders placed or performed during the hospital encounter of 05/19/23  Surgical pcr screen     Status: None   Collection Time: 05/21/23  3:34 AM   Specimen: Nasal Mucosa; Nasal Swab  Result Value Ref Range Status   MRSA, PCR NEGATIVE NEGATIVE Final   Staphylococcus aureus NEGATIVE NEGATIVE Final    Comment: (NOTE) The Xpert SA Assay (FDA approved for NASAL specimens in patients 22 years  of age and older), is one component of a comprehensive surveillance program. It is not intended to diagnose infection nor to guide or monitor treatment. Performed at [facility], [address] [place], [place] [address]     Coagulation Studies: No results for input(s): "LABPROT", "INR" in the last 72 hours.   Urinalysis: No results for input(s): "COLORURINE", "LABSPEC", "PHURINE", "GLUCOSEU", "HGBUR", "BILIRUBINUR", "KETONESUR", "PROTEINUR", "UROBILINOGEN", "NITRITE", "LEUKOCYTESUR" in the last 72 hours.  Invalid input(s): "APPERANCEUR"    Imaging: No results found.    Medications:    sodium chloride 50 mL/hr at 06/04/23 1400     acetaminophen  650 mg Oral Q8H   vitamin C  500 mg Oral BID   atorvastatin  20 mg Oral Daily   azaTHIOprine  100 mg Oral Daily   cloNIDine  0.1 mg Oral BID   clopidogrel  75  mg Oral Daily   vitamin B-12  500 mcg Oral Daily   enoxaparin (LOVENOX) injection  40 mg Subcutaneous Q24H   feeding supplement  1 Container Oral TID BM   folic acid  1 mg Oral Daily   insulin aspart  0-5 Units Subcutaneous QHS   insulin aspart  0-6 Units Subcutaneous TID WC   isosorbide mononitrate  60 mg Oral Daily   leptospermum manuka honey  1 Application Topical Daily   losartan  25 mg Oral Daily   metoprolol tartrate  50 mg Oral BID   multivitamin with minerals  1 tablet Oral Daily   OLANZapine zydis  10 mg Oral QHS   pantoprazole  40 mg Oral Daily   pregabalin  75 mg Oral BID   tacrolimus ER  10 mg Oral QAC breakfast   traZODone  150 mg Oral QHS   Vitamin D (Ergocalciferol)  50,000 Units Oral Q7 days   haloperidol lactate, hydrALAZINE, hydrALAZINE, ipratropium-albuterol, morphine injection, mouth rinse, oxyCODONE  Assessment/ Plan:  Theresa [name] is a 67 y.o.  female with past medical conditions including COPD, anemia, diabetes, CAD, hyperlipidemia, status post renal transplant, hypertension, who was admitted to [facility] on 05/19/2023 for Arterial occlusion [I70.90] Diabetes mellitus with foot ulcer and gangrene (HCC) [U04.540, L97.509, E11.52] Gangrene of right foot (HCC) [I96]   Chronic kidney disease stage IIIb, status post cadaver renal transplant completed at [facility] on 03/30/2019.  Baseline creatinine 1-1.5.   - Immunosuppressive medications: tacrolimus and azothiaprine.   -Creatinine 1.35, responding well to IV hydration.  Will continue for now. -Awaiting tacrolimus level -Continue to encourage oral intake  Lab Results  Component Value Date   CREATININE 1.35 (H) 06/04/2023   CREATININE 1.53 (H) 06/03/2023   CREATININE 1.66 (H) 06/02/2023    Intake/Output Summary (Last 24 hours) at 06/04/2023 1556 Last data filed at 06/04/2023 1400 Gross per 24 hour  Intake 1505.48 ml  Output --  Net 1505.48 ml      2.  Hypertension with chronic kidney disease.  Home regimen  includes furosemide, clonidine, and isosorbide.  Furosemide currently held.  Blood pressure slightly elevated, 189/58.  3. Anemia of chronic kidney disease Lab Results  Component Value Date   HGB 8.2 (L) 06/04/2023   Patient has received blood transfusions during this admission.  Hemoglobin 8.2.   LOS: 16 Theresa [name] 12/26/20243:56 PM

## 2023-06-04 NOTE — Progress Notes (Signed)
Per Malachi Carl NP, ok to In & Out cath pt after bladder scan showed pt retaining 316 cc.   Emptied 750 cc after I&O cc. Post bladder scan showed 0 cc.

## 2023-06-04 NOTE — Progress Notes (Signed)
     Referral previously received for Theresa [name] for goals of care discussion. Noted most recent palliative in-person assessment dated 06/02/2023 at which time it was recommended to follow for decision on possible re-engagement with hospice.   Chart reviewed for Recent provider notes, nurse notes, TOC notes, vitals, and labs and updates received from RN.   At this time patient appears stable/unchanged, still not eating much.   Per TOC note 06/01/23 "Spoke with patient's son, Theresa [name]. Patient was living with him prior to admission at the address listed. Patient son stated she was receiving hospice services. He is agreeable to SNF however would like that decision to be made by patient's granddaughter." TOC note dated 06/03/23 "CSW spoke with pt's granddaughter Theresa [name], explained SNF recs, she is agreeable, explained insurance auth process and Medicare.gov ratings website. Discussed current bed offers and texted them to Theresa [name] as well. TOC will continue to follow."  I called and spoke with the patient's con Theresa [name] and confirmed their decision to move forward with SNF placement. I offered outpatient palliative medicine at discharge to support in the outpatient setting and he accepted. TOC consult entered.  Please contact the palliative medicine provider on service for any new/urgent needs that require our assistance with this patient.  Thank you for your referral and allowing PMT to assist in Theresa [name]'s care.   Wynne Dust, NP Palliative Medicine Team Phone: [phone]  NO CHARGE

## 2023-06-04 NOTE — Progress Notes (Signed)
Progress Note   Patient: Theresa [name] DOB: 03-20-1956 DOA: 05/19/2023     16 DOS: the patient was seen and examined on 06/04/2023     Brief hospital course: HPI on admission: "Theresa [name] is a 67 y.o. female with medical history significant of  Anemia of chronic disease, COPD coronary artery disease status post stent placement, Diabetes mellitus with diabetic neuropathy and retinopathy, previous history of ESRD on hemodialysis currently status post transplant, hyperlipidemia, Hypertension moderate to severe mitral regurgitation, peripheral vascular disease who is a resident in a group home brought in on account of worsening gangrene noted on the right foot.  ..." See H&P for full HPI on admission & ED course.   Patient was admitted to the hospital, started on IV heparin and Vascular Surgery was consulted, plan for angiogram today (12/11).   Further hospital course and management as outlined below.     Assessment and Plan:   Right foot gangrene, s/p AKA done on 12/12 Vascular surgeon on board and case discussed On 05/20/2023 patient underwent angiogram with angioplasty to right SFA and popliteal arteries Patient underwent a right AKA on 05/21/2023 Continue Plavix Continue PT OT   AKI in a patient with history of ESRD  Patient used to be on on hemodialysis currently status post transplant No longer on hemodialysis Renal function stable at baseline --On tacrolimus and azathioprine Plan of care discussed with nephrologist Placed on IV fluid therapy   Acute metabolic encephalopathy versus delirium Continue delirium precaution Continue to manage acute medical conditions listed above  Thrombocytopenia -- appears acute on chronic. Worsening since admission, platelets now in 70's --held Lovenox and resumed after improvement in the platelet count --Also on Plavix - continue for now Monitor CBC   Hypokalemia - resolved with replacement --Monitor BMP, replace K  PRN   Hyperkalemia - K 5.8 on 12/12 treated and resolved Monitor potassium levels   Hypovolemic hyponatremia - mild, Na 132 on admission.   Pt appeared clinically dehydrated. --Encourage oral hydration --Monitor BMP   Anemia of chronic disease Status post 1 unit blood transfusion Monitor CBC closely   COPD - stable, not exacerbated -- Continue PRN bronchodilators  Coronary artery disease status post stent placement Continue statin therapy   Diabetes mellitus with diabetic neuropathy and retinopathy --Monitor CBG's --Sliding scale Novolog Continue Lyrica   Hypoglycemia - CBG noted 44 on evening of 12/13.  Treated an improved to 175 --Hypoglycemia protocol    Severe hypoalbuminemia: Continue protein supplement Dietitian consulted   Hyperlipidemia: Continue Lipitor   Hypertension:  Continue current antihypertensives   Vitamin D insufficiency: Continue vitamin D supplementation   Moderate to severe mitral regurgitation Continue to monitor volume status   Peripheral vascular disease, now s/p R AKA Continue on Plavix per vascular    Goals of care discussion, palliative care consulted on 12/20 Overall poor prognosis due to above comorbidities   Psych consulted to evaluate for decision-making capacity. According to psychiatry patient does not have decision-making capabilities Follow-up with psych recommendation  Body mass index is 22.44 kg/m. DVT prophylaxis: SCD due to thrombocytopenia, Plts improved Lenoxaparin (LOVENOX) injection 40 mg Start: 05/28/23 2200   Subjective:  Patient appears acutely ill She is still having decreased oral intake Continues to be on IV fluid as recommended by nephrology team Denies nausea vomiting abdominal pain or chest pain   Physical Exam: General exam: NAD, lying comfortably. Looks chronically ill appearing HEENT: mucus membranes dry, hearing grossly normal  Respiratory system: lungs clear  no wheezes or rhonchi, on room air,  normal respiratory effort. Cardiovascular system: normal S1/S2, RRR, no pedal edema.   Gastrointestinal system: soft, NT, ND Central nervous system: A&O x. no gross focal neurologic deficits, normal speech Extremities: s/p right AKA, dressing CDI Psychiatry: mood depressed, affect sad face and sleepy      Family Communication: None present on rounds.      Disposition: Status is: Inpatient Remains inpatient appropriate because: needs SNF placement. TOC working on days   Planned Discharge Destination: SNF placement when bed will be available.   enoxaparin (LOVENOX) injection 40 mg Start: 05/28/23 2200   Data Reviewed:    Latest Ref Rng & Units 06/04/2023    5:40 AM 06/03/2023    5:10 AM 06/02/2023    6:43 AM  BMP  Glucose 70 - 99 mg/dL 78  91  93   BUN 8 - 23 mg/dL 27  28  28    Creatinine 0.44 - 1.00 mg/dL 1.61  0.96  0.45   Sodium 135 - 145 mmol/L 137  137  136   Potassium 3.5 - 5.1 mmol/L 4.7  4.7  4.7   Chloride 98 - 111 mmol/L 111  109  108   CO2 22 - 32 mmol/L 19  21  20    Calcium 8.9 - 10.3 mg/dL 8.1  8.3  8.5     Vitals:   06/04/23 0408 06/04/23 0840 06/04/23 1337 06/04/23 1708  BP: (!) 172/52 (!) 189/58 (!) 181/52 (!) 177/46  Pulse: 74 71 70 66  Resp: 19 16 16 16   Temp: 98.8 F (37.1 C) 99.6 F (37.6 C) 98.5 F (36.9 C) 98.4 F (36.9 C)  TempSrc: Oral Oral    SpO2: 100% 100% 98% 99%  Weight:      Height:          Latest Ref Rng & Units 06/04/2023    5:40 AM 06/03/2023    5:10 AM 06/02/2023    6:43 AM  CBC  WBC 4.0 - 10.5 K/uL 4.8  4.4  5.4   Hemoglobin 12.0 - 15.0 g/dL 8.2  8.6  8.3   Hematocrit 36.0 - 46.0 % 25.6  27.0  25.8   Platelets 150 - 400 K/uL 199  191  183      Author: Loyce Dys, MD 06/04/2023 5:21 PM  For on call review [email or web address].

## 2023-06-04 NOTE — Progress Notes (Signed)
Pt continues to refuse po intake ... Family attempted to feed pt but refused.   Will continue to encourage and educate

## 2023-06-04 NOTE — Plan of Care (Signed)
   Problem: Education: Goal: Ability to describe self-care measures that may prevent or decrease complications (Diabetes Survival Skills Education) will improve Outcome: Progressing Goal: Individualized Educational Video(s) Outcome: Progressing   Problem: Coping: Goal: Ability to adjust to condition or change in health will improve Outcome: Progressing   Problem: Fluid Volume: Goal: Ability to maintain a balanced intake and output will improve Outcome: Progressing   Problem: Health Behavior/Discharge Planning: Goal: Ability to identify and utilize available resources and services will improve Outcome: Progressing Goal: Ability to manage health-related needs will improve Outcome: Progressing   Problem: Metabolic: Goal: Ability to maintain appropriate glucose levels will improve Outcome: Progressing   Problem: Skin Integrity: Goal: Risk for impaired skin integrity will decrease Outcome: Progressing

## 2023-06-05 DIAGNOSIS — E11621 Type 2 diabetes mellitus with foot ulcer: Secondary | ICD-10-CM | POA: Diagnosis not present

## 2023-06-05 DIAGNOSIS — L97509 Non-pressure chronic ulcer of other part of unspecified foot with unspecified severity: Secondary | ICD-10-CM | POA: Diagnosis not present

## 2023-06-05 DIAGNOSIS — E1152 Type 2 diabetes mellitus with diabetic peripheral angiopathy with gangrene: Secondary | ICD-10-CM | POA: Diagnosis not present

## 2023-06-05 LAB — BASIC METABOLIC PANEL
Anion gap: 10 (ref 5–15)
BUN: 27 mg/dL — ABNORMAL HIGH (ref 8–23)
CO2: 17 mmol/L — ABNORMAL LOW (ref 22–32)
Calcium: 8.2 mg/dL — ABNORMAL LOW (ref 8.9–10.3)
Chloride: 112 mmol/L — ABNORMAL HIGH (ref 98–111)
Creatinine, Ser: 1.32 mg/dL — ABNORMAL HIGH (ref 0.44–1.00)
GFR, Estimated: 45 mL/min — ABNORMAL LOW (ref >60)
Glucose, Bld: 98 mg/dL (ref 70–99)
Potassium: 4.5 mmol/L (ref 3.5–5.1)
Sodium: 139 mmol/L (ref 135–145)

## 2023-06-05 LAB — GLUCOSE, CAPILLARY
Glucose-Capillary: 93 mg/dL (ref 70–99)
Glucose-Capillary: 96 mg/dL (ref 70–99)
Glucose-Capillary: 108 mg/dL — ABNORMAL HIGH (ref 70–99)
Glucose-Capillary: 111 mg/dL — ABNORMAL HIGH (ref 70–99)

## 2023-06-05 LAB — CBC
HCT: 24.8 % — ABNORMAL LOW (ref 36.0–46.0)
Hemoglobin: 7.8 g/dL — ABNORMAL LOW (ref 12.0–15.0)
MCH: 31.6 pg (ref 26.0–34.0)
MCHC: 31.5 g/dL (ref 30.0–36.0)
MCV: 100.4 fL — ABNORMAL HIGH (ref 80.0–100.0)
Platelets: 197 10*3/uL (ref 150–400)
RBC: 2.47 MIL/uL — ABNORMAL LOW (ref 3.87–5.11)
RDW: 18.6 % — ABNORMAL HIGH (ref 11.5–15.5)
WBC: 4.2 10*3/uL (ref 4.0–10.5)
nRBC: 0 % (ref 0.0–0.2)

## 2023-06-05 NOTE — Plan of Care (Signed)

## 2023-06-05 NOTE — Progress Notes (Signed)
Physical Therapy Treatment Patient Details Name: Theresa [name] MRN: [id number] DOB: 07-28-1955 Today's Date: 06/05/2023   History of Present Illness Pt is s/p R AKA 2/2 R foot and lower leg gangrene. PMH of CAD s/p stent, HTN, COPD, MI, CHF, dementia, DMII, previous ESRD now s/p transplant, HLD, PVD.    PT Comments  Pt lethargic but level of alertness improved throughout the session. Pt required +2 heavy assist with all bed mobility tasks and once in sitting at the EOB presented with significant posterior instability that took extensive time to correct with anterior weight shifting activities.  Once posterior lean was addressed pt began to lean to her L and with right lateral weight shifting and rotational activities pt progressed to where she could hold her balance unsupported at the EOB with only close SBA.  Pt participated in below therex and put forth fair effort with the exercises with heavy verbal and tactile cuing for technique.  Pt will benefit from continued PT services upon discharge to safely address deficits listed in patient problem list for decreased caregiver assistance and eventual return to PLOF.     If plan is discharge home, recommend the following: Two people to help with bathing/dressing/bathroom;Two people to help with walking and/or transfers;Direct supervision/assist for medications management;Help with stairs or ramp for entrance;Assist for transportation;Assistance with cooking/housework;Assistance with feeding;Direct supervision/assist for financial management;Supervision due to cognitive status   Can travel by private vehicle     No  Equipment Recommendations  Other (comment) (TBD)    Recommendations for Other Services       Precautions / Restrictions Precautions Precautions: Fall Restrictions Weight Bearing Restrictions Per Provider Order: Yes RLE Weight Bearing Per Provider Order: Non weight bearing Other Position/Activity Restrictions: R AKA      Mobility  Bed Mobility Overal bed mobility: Needs Assistance Bed Mobility: Supine to Sit, Sit to Supine Rolling: Total assist   Supine to sit: +2 for physical assistance, Max assist Sit to supine: Max assist, +2 for physical assistance   General bed mobility comments: Near total assist for BLE and trunk control    Transfers                   General transfer comment: unsafe to attempt    Ambulation/Gait                   Stairs             Wheelchair Mobility     Tilt Bed    Modified Rankin (Stroke Patients Only)       Balance Overall balance assessment: Needs assistance Sitting-balance support: Single extremity supported Sitting balance-Leahy Scale: Poor Sitting balance - Comments: Constant assist to prevent posterior and R lateral LOB this session but gradually improved with weight shifting activities to where she could occasinally hold her balance with only close SBA Postural control: Posterior lean, Right lateral lean                                  Cognition Arousal: Lethargic Behavior During Therapy: Flat affect Overall Cognitive Status: History of cognitive impairments - at baseline                                          Exercises Total Joint Exercises Ankle Circles/Pumps: AAROM, 5 reps,  10 reps, Left Heel Slides: AAROM, Left, 10 reps, 15 reps Hip ABduction/ADduction: AAROM, Left, 10 reps, 15 reps Straight Leg Raises: AAROM, Left, 10 reps, 15 reps Other Exercises Other Exercises: Static sitting at EOB for core therex and improved activity tolerance Other Exercises: Anterior, left lateral, and rotational weight shifting activities in sitting at EOB to address instability    General Comments        Pertinent Vitals/Pain Pain Assessment Breathing: normal Negative Vocalization: none Facial Expression: smiling or inexpressive Body Language: relaxed Consolability: no need to console PAINAD  Score: 0    Home Living Family/patient expects to be discharged to:: Skilled nursing facility                        Prior Function            PT Goals (current goals can now be found in the care plan section) Progress towards PT goals: Progressing toward goals    Frequency    Min 1X/week      PT Plan      Co-evaluation PT/OT/SLP Co-Evaluation/Treatment: Yes Reason for Co-Treatment: Necessary to address cognition/behavior during functional activity;For patient/therapist safety;To address functional/ADL transfers PT goals addressed during session: Mobility/safety with mobility;Balance;Strengthening/ROM OT goals addressed during session: ADL's and self-care;Strengthening/ROM      AM-PAC PT "6 Clicks" Mobility   Outcome Measure  Help needed turning from your back to your side while in a flat bed without using bedrails?: A Lot Help needed moving from lying on your back to sitting on the side of a flat bed without using bedrails?: Total Help needed moving to and from a bed to a chair (including a wheelchair)?: Total Help needed standing up from a chair using your arms (e.g., wheelchair or bedside chair)?: Total Help needed to walk in hospital room?: Total Help needed climbing 3-5 steps with a railing? : Total 6 Click Score: 7    End of Session   Activity Tolerance: Patient tolerated treatment well Patient left: in bed;with call bell/phone within reach;with bed alarm set Nurse Communication: Mobility status;Other (comment) (nurse and CNA notified that patient left on bed pan at end of session) PT Visit Diagnosis: Other abnormalities of gait and mobility (R26.89);Difficulty in walking, not elsewhere classified (R26.2);Muscle weakness (generalized) (M62.81);Pain Pain - Right/Left: Right Pain - part of body: Leg     Time: 0347-4259 PT Time Calculation (min) (ACUTE ONLY): 24 min  Charges:    $Therapeutic Activity: 8-22 mins PT General Charges $$ ACUTE PT  VISIT: 1 Visit                    D. Elly Modena PT, DPT 06/05/23, 4:02 PM

## 2023-06-05 NOTE — TOC Progression Note (Signed)
Transition of Care [facility]) - Progression Note    Patient Details  Name: ANDRIELLE DECKARD MRN: [id number] Date of Birth: 30-Mar-1956  [facility]) CM/SW Contact  Garret Reddish, RN Phone Number: 06/05/2023, 2:30 PM  Clinical Narrative:    I have spoken to patient's son Wardell Heath.  He has selected [facility]: [facility]. I have made Tonya, Admission Coordinator for [facility] that patient's son has accepted bed offer.  Archie Patten will follow back up with me on her bed availability.  TOC will continue to follow for discharge planning.          Expected Discharge Plan and Services                                               Social Determinants of Health (SDOH) Interventions SDOH Screenings   Food Insecurity: Patient Unable To Answer (05/20/2023)  Housing: High Risk (05/20/2023)  Transportation Needs: Patient Unable To Answer (05/20/2023)  Utilities: Patient Unable To Answer (05/20/2023)  Alcohol Screen: Low Risk  (12/17/2022)  Depression (PHQ2-9): Medium Risk (12/31/2022)  Financial Resource Strain: Low Risk  (02/13/2023)   Received from [facility]  Physical Activity: Inactive (12/17/2022)  Social Connections: Socially Isolated (12/17/2022)  Stress: No Stress Concern Present (12/17/2022)  Tobacco Use: Medium Risk (05/21/2023)  Health Literacy: Low Risk  (09/13/2020)   [facility] from [facility]    Readmission Risk Interventions     No data to display

## 2023-06-05 NOTE — Progress Notes (Signed)
PROGRESS NOTE    Theresa [name]  ZOX:[id number] DOB: [birth year] DOA: 05/19/2023 PCP: Dana Allan, MD    Brief Narrative:   HPI on admission: "Theresa [name] is a 67 y.o. female with medical history significant of  Anemia of chronic disease, COPD coronary artery disease status post stent placement, Diabetes mellitus with diabetic neuropathy and retinopathy, previous history of ESRD on hemodialysis currently status post transplant, hyperlipidemia, Hypertension moderate to severe mitral regurgitation, peripheral vascular disease who is a resident in a group home brought in on account of worsening gangrene noted on the right foot.  ..." See H&P for full HPI on admission & ED course.   Patient was admitted to the hospital, started on IV heparin and Vascular Surgery was consulted, plan for angiogram today (12/11).   Further hospital course and management as outlined below.   Assessment & Plan:   Principal Problem:   Diabetes mellitus with foot ulcer and gangrene (HCC) Active Problems:   Dementia (HCC)   Gangrene of right foot (HCC)   Arterial occlusion  Right foot gangrene, s/p AKA done on 12/12 Vascular surgeon on board and case discussed On 05/20/2023 patient underwent angiogram with angioplasty to right SFA and popliteal arteries Patient underwent a right AKA on 05/21/2023 Continue Plavix Continue PT OT   AKI in a patient with history of ESRD  Patient used to be on on hemodialysis currently status post transplant No longer on hemodialysis Renal function stable at baseline --On tacrolimus and azathioprine Plan: Continue IV fluids    Acute metabolic encephalopathy versus delirium Continue delirium precaution    Thrombocytopenia -- appears acute on chronic. Worsening since admission, platelets now in 70's --held Lovenox and resumed after improvement in the platelet count --Also on Plavix - continue for now Monitor CBC   Hypokalemia - resolved with replacement --Monitor  BMP, replace K PRN   Hyperkalemia - K 5.8 on 12/12 treated and resolved Monitor potassium levels   Hypovolemic hyponatremia - mild, Na 132 on admission.   Pt appeared clinically dehydrated. --Encourage oral hydration --IV fluids --Monitor BMP   Anemia of chronic disease Status post 1 unit blood transfusion Monitor CBC closely   COPD - stable, not exacerbated -- Continue PRN bronchodilators   Coronary artery disease status post stent placement Continue statin therapy   Diabetes mellitus with diabetic neuropathy and retinopathy --Monitor CBG's --Sliding scale Novolog Continue Lyrica   Hypoglycemia - CBG noted 44 on evening of 12/13.  Treated an improved to 175 --Hypoglycemia protocol     Severe hypoalbuminemia: Continue protein supplement Dietitian consulted   Hyperlipidemia: Continue Lipitor   Hypertension:  Continue current antihypertensives   Vitamin D insufficiency: Continue vitamin D supplementation   Moderate to severe mitral regurgitation Continue to monitor volume status   Peripheral vascular disease, now s/p R AKA Continue on Plavix per vascular    Goals of care discussion, palliative care consulted on 12/20 Overall poor prognosis due to above comorbidities   Psych consulted to evaluate for decision-making capacity. According to psychiatry patient does not have decision-making capabilities Likely not a candidate for inpatient psychiatry   DVT prophylaxis: Lovenox Code Status: DNR Family Communication: None today Disposition Plan: Status is: Inpatient Remains inpatient appropriate because: Unsafe discharge plan.  AKI   Level of care: Progressive  Consultants:  Nephrology  Procedures:  None  Antimicrobials: None   Subjective: Seen and examined.  Flattened affect.  No visible distress  Objective: Vitals:   06/05/23 0103 06/05/23 0420 06/05/23 0758 06/05/23  1234  BP: 138/61 (!) 151/44 (!) 156/52 (!) 156/40  Pulse: 64 65 72 66  Resp:   19 16 16   Temp:   98.5 F (36.9 C) 97.9 F (36.6 C)  TempSrc:      SpO2:  100% 98% 99%  Weight:      Height:        Intake/Output Summary (Last 24 hours) at 06/05/2023 1429 Last data filed at 06/05/2023 0630 Gross per 24 hour  Intake 1062.45 ml  Output 750 ml  Net 312.45 ml   Filed Weights   05/19/23 1643 05/20/23 1234 05/21/23 1019  Weight: 66.2 kg 65 kg 65 kg    Examination:  General exam: Appears chronically ill Respiratory system: Clear to auscultation. Respiratory effort normal. Cardiovascular system: S1-S2, RRR, no murmurs, no pedal edema Gastrointestinal system: Soft, NT/ND, normal bowel sounds Central nervous system: Alert and oriented x 2. No focal neurological deficits. Extremities: Right AKA Skin: No rashes, lesions or ulcers Psychiatry: Judgement and insight appear impaired. Mood & affect flattened.     Data Reviewed: I have personally reviewed following labs and imaging studies  CBC: Recent Labs  Lab 06/01/23 0656 06/02/23 0643 06/03/23 0510 06/04/23 0540 06/05/23 0458  WBC 5.3 5.4 4.4 4.8 4.2  HGB 8.5* 8.3* 8.6* 8.2* 7.8*  HCT 26.6* 25.8* 27.0* 25.6* 24.8*  MCV 98.5 96.6 100.4* 100.4* 100.4*  PLT 155 183 191 199 197   Basic Metabolic Panel: Recent Labs  Lab 06/01/23 0656 06/02/23 0643 06/03/23 0510 06/04/23 0540 06/05/23 0458  NA 136 136 137 137 139  K 4.5 4.7 4.7 4.7 4.5  CL 109 108 109 111 112*  CO2 20* 20* 21* 19* 17*  GLUCOSE 95 93 91 78 98  BUN 23 28* 28* 27* 27*  CREATININE 1.51* 1.66* 1.53* 1.35* 1.32*  CALCIUM 8.3* 8.5* 8.3* 8.1* 8.2*  MG 2.4 2.4 2.4  --   --   PHOS 3.4 3.6 3.5  --   --    GFR: Estimated Creatinine Clearance: 41.3 mL/min (A) (by C-G formula based on SCr of 1.32 mg/dL (H)). Liver Function Tests: No results for input(s): "AST", "ALT", "ALKPHOS", "BILITOT", "PROT", "ALBUMIN" in the last 168 hours. No results for input(s): "LIPASE", "AMYLASE" in the last 168 hours. No results for input(s): "AMMONIA" in the  last 168 hours. Coagulation Profile: No results for input(s): "INR", "PROTIME" in the last 168 hours. Cardiac Enzymes: No results for input(s): "CKTOTAL", "CKMB", "CKMBINDEX", "TROPONINI" in the last 168 hours. BNP (last 3 results) No results for input(s): "PROBNP" in the last 8760 hours. HbA1C: No results for input(s): "HGBA1C" in the last 72 hours. CBG: Recent Labs  Lab 06/04/23 1251 06/04/23 1709 06/04/23 2207 06/05/23 0759 06/05/23 1235  GLUCAP 104* 91 137* 93 96   Lipid Profile: No results for input(s): "CHOL", "HDL", "LDLCALC", "TRIG", "CHOLHDL", "LDLDIRECT" in the last 72 hours. Thyroid Function Tests: No results for input(s): "TSH", "T4TOTAL", "FREET4", "T3FREE", "THYROIDAB" in the last 72 hours. Anemia Panel: No results for input(s): "VITAMINB12", "FOLATE", "FERRITIN", "TIBC", "IRON", "RETICCTPCT" in the last 72 hours. Sepsis Labs: No results for input(s): "PROCALCITON", "LATICACIDVEN" in the last 168 hours.  No results found for this or any previous visit (from the past 240 hours).       Radiology Studies: No results found.      Scheduled Meds:  acetaminophen  650 mg Oral Q8H   vitamin C  500 mg Oral BID   atorvastatin  20 mg Oral Daily   azaTHIOprine  100 mg Oral Daily   cloNIDine  0.1 mg Oral BID   clopidogrel  75 mg Oral Daily   vitamin B-12  500 mcg Oral Daily   enoxaparin (LOVENOX) injection  40 mg Subcutaneous Q24H   feeding supplement  1 Container Oral TID BM   folic acid  1 mg Oral Daily   insulin aspart  0-5 Units Subcutaneous QHS   insulin aspart  0-6 Units Subcutaneous TID WC   isosorbide mononitrate  60 mg Oral Daily   leptospermum manuka honey  1 Application Topical Daily   losartan  25 mg Oral Daily   metoprolol tartrate  50 mg Oral BID   multivitamin with minerals  1 tablet Oral Daily   OLANZapine zydis  10 mg Oral QHS   pantoprazole  40 mg Oral Daily   pregabalin  75 mg Oral BID   tacrolimus ER  10 mg Oral QAC breakfast    traZODone  150 mg Oral QHS   Vitamin D (Ergocalciferol)  50,000 Units Oral Q7 days   Continuous Infusions:  sodium chloride 50 mL/hr at 06/05/23 0630     LOS: 17 days      Tresa Moore, MD [facility]   If 7PM-7AM, please contact night-coverage  06/05/2023, 2:29 PM

## 2023-06-05 NOTE — Progress Notes (Signed)
Occupational Therapy Treatment Patient Details Name: RANEY HEMBERGER MRN: [id number] DOB: 1956-01-18 Today's Date: 06/05/2023   History of present illness Pt is s/p R AKA 2/2 R foot and lower leg gangrene. PMH of CAD s/p stent, HTN, COPD, MI, CHF, dementia, DMII, previous ESRD now s/p transplant, HLD, PVD.   OT comments  Pt. required maxA supine to sit to the EOB, as well as returning to supine from sitting. Pt. presents with strong posterior leaning, however responded well to slow rocking in sitting to facilitate trunk forward flexion, and facilitation of rotation through the trunk.  Pt. Required Max hand-over-hand assist to wipe her face using a wash cloth. Pt. required MaxA x2 to reposition to the [facility], and for rolling in preparation for bedpan placement. Pt. Continues to benefit from OT services for ADL training, functional mobility, UE functioning, and pt./caregiver Education about positioning, and anticipated ADL needs.      If plan is discharge home, recommend the following:  Two people to help with walking and/or transfers;Two people to help with bathing/dressing/bathroom;Direct supervision/assist for medications management;Supervision due to cognitive status;Direct supervision/assist for financial management;Assist for transportation;Help with stairs or ramp for entrance;Assistance with feeding;Assistance with cooking/housework   Equipment Recommendations       Recommendations for Other Services      Precautions / Restrictions Restrictions Weight Bearing Restrictions Per Provider Order: Yes RLE Weight Bearing Per Provider Order: Non weight bearing Other Position/Activity Restrictions: R AKA       Mobility Bed Mobility Overal bed mobility: Needs Assistance Bed Mobility: Supine to Sit, Sit to Supine Rolling: Total assist   Supine to sit: +2 for physical assistance, Max assist Sit to supine: Max assist, +2 for physical assistance        Transfers                          Balance                                           ADL either performed or assessed with clinical judgement   ADL       Grooming: Wash/dry face;Maximal assistance                                      Extremity/Trunk Assessment Upper Extremity Assessment Upper Extremity Assessment: Generalized weakness            Vision Patient Visual Report: No change from baseline     Perception     Praxis      Cognition Arousal: Lethargic Behavior During Therapy: WFL for tasks assessed/performed, Flat affect Overall Cognitive Status: History of cognitive impairments - at baseline                                          Exercises      Shoulder Instructions       General Comments      Pertinent Vitals/ Pain       Pain Assessment Pain Assessment: Faces Faces Pain Scale: Hurts little more Pain Descriptors / Indicators: Grimacing, Moaning, Guarding Pain Intervention(s): Monitored during session  Home Living  Prior Functioning/Environment              Frequency  Min 1X/week        Progress Toward Goals  OT Goals(current goals can now be found in the care plan section)  Progress towards OT goals: OT to reassess next treatment  Acute Rehab OT Goals Time For Goal Achievement: 06/19/23 Potential to Achieve Goals: Fair  Plan      Co-evaluation    PT/OT/SLP Co-Evaluation/Treatment: Yes Reason for Co-Treatment: Necessary to address cognition/behavior during functional activity;For patient/therapist safety;To address functional/ADL transfers PT goals addressed during session: Mobility/safety with mobility;Balance;Strengthening/ROM OT goals addressed during session: ADL's and self-care;Strengthening/ROM      AM-PAC OT "6 Clicks" Daily Activity     Outcome Measure   Help from another person eating meals?: None Help from another person  taking care of personal grooming?: A Lot Help from another person toileting, which includes using toliet, bedpan, or urinal?: Total Help from another person bathing (including washing, rinsing, drying)?: Total Help from another person to put on and taking off regular upper body clothing?: Total Help from another person to put on and taking off regular lower body clothing?: Total 6 Click Score: 10    End of Session Equipment Utilized During Treatment: Gait belt  OT Visit Diagnosis: Other abnormalities of gait and mobility (R26.89);Muscle weakness (generalized) (M62.81);Pain;Other symptoms and signs involving cognitive function Pain - Right/Left: Right Pain - part of body: Leg   Activity Tolerance     Patient Left with bed alarm set;in bed;with call bell/phone within reach Reinaldo Raddle PT)   Nurse Communication          Time: 1610-9604 OT Time Calculation (min): 15 min  Charges: OT General Charges $OT Visit: 1 Visit OT Treatments $Self Care/Home Management : 8-22 mins  Olegario Messier, MS, OTR/L  Olegario Messier 06/05/2023, 3:11 PM

## 2023-06-05 NOTE — Progress Notes (Signed)
Bladder scan @0530  90mL and no urine output throughout shift. Cliffton Asters, on-call provider, notified. No new orders received. Will continue to monitor and convey to Day RN.

## 2023-06-05 NOTE — Progress Notes (Signed)
     Referral previously received for [name] for goals of care discussion. Noted most recent palliative in-person assessment dated 06/02/2023 at which time it was recommended to follow for decision on possible re-engagement with hospice. As noted in palliative note yesterday, family has elected SNF placement.  Chart reviewed for Recent provider notes, nurse notes, TOC notes, and vitals and updates received from RN. I briefly entered the patient's room to check on her. She was sleeping and I elected to not wake her given she does not have decision making capacity. She appears comfortable and in no distress.  At this time patient appears stable. TOC note today states family has selected [facility] who has been notified. We are awaiting bed confirmation.Anticipate d/c to SNF when bed ready, possibly next 24-48 hours. No plan for in person follow-up at this time. Please contact the palliative medicine provider on service for any new/urgent needs that require our assistance with this patient.  Thank you for your referral and allowing PMT to assist in Maylie Coday Martes's care.   Wynne Dust, NP Palliative Medicine Team Phone: [phone]  NO CHARGE

## 2023-06-05 NOTE — Progress Notes (Signed)
[facility]  ROUNDING NOTE   Subjective:   Patient seen laying in bed Untouched breakfast tray at bedside Room air  Received chat yesterday that patient has not voided with bladder scan . In and out cath performed with urine return.  Creatinine 1.32   Objective:  Vital signs in last 24 hours:  Temp:  [98 F (36.7 C)-98.6 F (37 C)] 98.5 F (36.9 C) (12/27 0758) Pulse Rate:  [64-77] 72 (12/27 0758) Resp:  [16-19] 16 (12/27 0758) BP: (138-181)/(44-61) 156/52 (12/27 0758) SpO2:  [96 %-100 %] 98 % (12/27 0758)  Weight change:  Filed Weights   05/19/23 1643 05/20/23 1234 05/21/23 1019  Weight: 66.2 kg 65 kg 65 kg    Intake/Output: I/O last 3 completed shifts: In: 2567.9 [P.O.:360; I.V.:2207.9] Out: 750 [Urine:750]   Intake/Output this shift:  No intake/output data recorded.  Physical Exam: General: NAD, laying in bed  Head: Normocephalic, atraumatic. Moist oral mucosal membranes  Eyes: Anicteric  Lungs:  Clear to auscultation, normal effort  Heart: Regular rate and rhythm  Abdomen:  Soft, nontender  Extremities: No peripheral edema.  Rt AKA on 05/21/23.  Neurologic: Alert, oriented to self and place  Skin: No lesions  Access: None    Basic Metabolic Panel: Recent Labs  Lab 06/01/23 0656 06/02/23 0643 06/03/23 0510 06/04/23 0540 06/05/23 0458  NA 136 136 137 137 139  K 4.5 4.7 4.7 4.7 4.5  CL 109 108 109 111 112*  CO2 20* 20* 21* 19* 17*  GLUCOSE 95 93 91 78 98  BUN 23 28* 28* 27* 27*  CREATININE 1.51* 1.66* 1.53* 1.35* 1.32*  CALCIUM 8.3* 8.5* 8.3* 8.1* 8.2*  MG 2.4 2.4 2.4  --   --   PHOS 3.4 3.6 3.5  --   --     Liver Function Tests: No results for input(s): "AST", "ALT", "ALKPHOS", "BILITOT", "PROT", "ALBUMIN" in the last 168 hours.  No results for input(s): "LIPASE", "AMYLASE" in the last 168 hours. No results for input(s): "AMMONIA" in the last 168 hours.  CBC: Recent Labs  Lab 06/01/23 0656 06/02/23 0643  06/03/23 0510 06/04/23 0540 06/05/23 0458  WBC 5.3 5.4 4.4 4.8 4.2  HGB 8.5* 8.3* 8.6* 8.2* 7.8*  HCT 26.6* 25.8* 27.0* 25.6* 24.8*  MCV 98.5 96.6 100.4* 100.4* 100.4*  PLT 155 183 191 199 197    Cardiac Enzymes: No results for input(s): "CKTOTAL", "CKMB", "CKMBINDEX", "TROPONINI" in the last 168 hours.  BNP: Invalid input(s): "POCBNP"  CBG: Recent Labs  Lab 06/04/23 0839 06/04/23 1251 06/04/23 1709 06/04/23 2207 06/05/23 0759  GLUCAP 69* 104* 91 137* 93    Microbiology: Results for orders placed or performed during the hospital encounter of 05/19/23  Surgical pcr screen     Status: None   Collection Time: 05/21/23  3:34 AM   Specimen: Nasal Mucosa; Nasal Swab  Result Value Ref Range Status   MRSA, PCR NEGATIVE NEGATIVE Final   Staphylococcus aureus NEGATIVE NEGATIVE Final    Comment: (NOTE) The Xpert SA Assay (FDA approved for NASAL specimens in patients 34 years of age and older), is one component of a comprehensive surveillance program. It is not intended to diagnose infection nor to guide or monitor treatment. Performed at [facility], [address] [place], [place] [address]     Coagulation Studies: No results for input(s): "LABPROT", "INR" in the last 72 hours.   Urinalysis: No results for input(s): "COLORURINE", "LABSPEC", "PHURINE", "GLUCOSEU", "HGBUR", "BILIRUBINUR", "KETONESUR", "PROTEINUR", "UROBILINOGEN", "  NITRITE", "LEUKOCYTESUR" in the last 72 hours.  Invalid input(s): "APPERANCEUR"    Imaging: No results found.    Medications:    sodium chloride 50 mL/hr at 06/05/23 0630     acetaminophen  650 mg Oral Q8H   vitamin C  500 mg Oral BID   atorvastatin  20 mg Oral Daily   azaTHIOprine  100 mg Oral Daily   cloNIDine  0.1 mg Oral BID   clopidogrel  75 mg Oral Daily   vitamin B-12  500 mcg Oral Daily   enoxaparin (LOVENOX) injection  40 mg Subcutaneous Q24H   feeding supplement  1 Container Oral TID BM   folic acid  1 mg  Oral Daily   insulin aspart  0-5 Units Subcutaneous QHS   insulin aspart  0-6 Units Subcutaneous TID WC   isosorbide mononitrate  60 mg Oral Daily   leptospermum manuka honey  1 Application Topical Daily   losartan  25 mg Oral Daily   metoprolol tartrate  50 mg Oral BID   multivitamin with minerals  1 tablet Oral Daily   OLANZapine zydis  10 mg Oral QHS   pantoprazole  40 mg Oral Daily   pregabalin  75 mg Oral BID   tacrolimus ER  10 mg Oral QAC breakfast   traZODone  150 mg Oral QHS   Vitamin D (Ergocalciferol)  50,000 Units Oral Q7 days   haloperidol lactate, hydrALAZINE, hydrALAZINE, ipratropium-albuterol, morphine injection, mouth rinse, oxyCODONE  Assessment/ Plan:  [name] is a 67 y.o.  female with past medical conditions including COPD, anemia, diabetes, CAD, hyperlipidemia, status post renal transplant, hypertension, who was admitted to [facility] on 05/19/2023 for Arterial occlusion [I70.90] Diabetes mellitus with foot ulcer and gangrene (HCC) [U98.119, L97.509, E11.52] Gangrene of right foot (HCC) [I96]   Chronic kidney disease stage IIIb, status post cadaver renal transplant completed at [facility] on 03/30/2019.  Baseline creatinine 1-1.5.   - Immunosuppressive medications: tacrolimus and azothiaprine.   -Creatinine 1.32,  - Continue IVF  Will continue for now. -Tacrolimus level- 3.5 -Continue to encourage oral intake  Lab Results  Component Value Date   CREATININE 1.32 (H) 06/05/2023   CREATININE 1.35 (H) 06/04/2023   CREATININE 1.53 (H) 06/03/2023    Intake/Output Summary (Last 24 hours) at 06/05/2023 1219 Last data filed at 06/05/2023 0630 Gross per 24 hour  Intake 1157.53 ml  Output 750 ml  Net 407.53 ml      2.  Hypertension with chronic kidney disease.  Home regimen includes furosemide, clonidine, and isosorbide.  Furosemide currently held.  Blood pressure  156/52  3. Anemia of chronic kidney disease Lab Results  Component Value Date   HGB 7.8 (L)  06/05/2023   Patient has received blood transfusions during this admission.  Hemoglobin 7.8.   LOS: 17 Eithen Castiglia 12/27/202412:19 PM

## 2023-06-06 DIAGNOSIS — E1152 Type 2 diabetes mellitus with diabetic peripheral angiopathy with gangrene: Secondary | ICD-10-CM | POA: Diagnosis not present

## 2023-06-06 DIAGNOSIS — L97509 Non-pressure chronic ulcer of other part of unspecified foot with unspecified severity: Secondary | ICD-10-CM | POA: Diagnosis not present

## 2023-06-06 DIAGNOSIS — E11621 Type 2 diabetes mellitus with foot ulcer: Secondary | ICD-10-CM | POA: Diagnosis not present

## 2023-06-06 LAB — BASIC METABOLIC PANEL
Anion gap: 7 (ref 5–15)
BUN: 27 mg/dL — ABNORMAL HIGH (ref 8–23)
CO2: 17 mmol/L — ABNORMAL LOW (ref 22–32)
Calcium: 8.3 mg/dL — ABNORMAL LOW (ref 8.9–10.3)
Chloride: 117 mmol/L — ABNORMAL HIGH (ref 98–111)
Creatinine, Ser: 1.31 mg/dL — ABNORMAL HIGH (ref 0.44–1.00)
GFR, Estimated: 45 mL/min — ABNORMAL LOW (ref >60)
Glucose, Bld: 107 mg/dL — ABNORMAL HIGH (ref 70–99)
Potassium: 4.8 mmol/L (ref 3.5–5.1)
Sodium: 141 mmol/L (ref 135–145)

## 2023-06-06 LAB — GLUCOSE, CAPILLARY
Glucose-Capillary: 107 mg/dL — ABNORMAL HIGH (ref 70–99)
Glucose-Capillary: 107 mg/dL — ABNORMAL HIGH (ref 70–99)
Glucose-Capillary: 122 mg/dL — ABNORMAL HIGH (ref 70–99)
Glucose-Capillary: 101 mg/dL — ABNORMAL HIGH (ref 70–99)

## 2023-06-06 NOTE — Progress Notes (Signed)
[facility]  PROGRESS NOTE   Subjective:   Patient seen at bedside.  Feels much better today.  Objective:  Vital signs: Blood pressure (!) 171/48, pulse 78, temperature 99.1 F (37.3 C), resp. rate 16, height 5\' 7"  (1.702 m), weight 65 kg, SpO2 100%.  Intake/Output Summary (Last 24 hours) at 06/06/2023 1607 Last data filed at 06/06/2023 0326 Gross per 24 hour  Intake 729 ml  Output --  Net 729 ml   Filed Weights   05/19/23 1643 05/20/23 1234 05/21/23 1019  Weight: 66.2 kg 65 kg 65 kg     Physical Exam: General:  No acute distress  Head:  Normocephalic, atraumatic. Moist oral mucosal membranes  Eyes:  Anicteric  Neck:  Supple  Lungs:   Clear to auscultation, normal effort  Heart:  S1S2 no rubs  Abdomen:   Soft, nontender, bowel sounds present  Extremities:  peripheral edema.  Neurologic:  Awake, alert, following commands  Skin:  No lesions  Access:     Basic Metabolic Panel: Recent Labs  Lab 06/01/23 0656 06/02/23 0643 06/03/23 0510 06/04/23 0540 06/05/23 0458 06/06/23 0807  NA 136 136 137 137 139 141  K 4.5 4.7 4.7 4.7 4.5 4.8  CL 109 108 109 111 112* 117*  CO2 20* 20* 21* 19* 17* 17*  GLUCOSE 95 93 91 78 98 107*  BUN 23 28* 28* 27* 27* 27*  CREATININE 1.51* 1.66* 1.53* 1.35* 1.32* 1.31*  CALCIUM 8.3* 8.5* 8.3* 8.1* 8.2* 8.3*  MG 2.4 2.4 2.4  --   --   --   PHOS 3.4 3.6 3.5  --   --   --    GFR: Estimated Creatinine Clearance: 41.6 mL/min (A) (by C-G formula based on SCr of 1.31 mg/dL (H)).  Liver Function Tests: No results for input(s): "AST", "ALT", "ALKPHOS", "BILITOT", "PROT", "ALBUMIN" in the last 168 hours. No results for input(s): "LIPASE", "AMYLASE" in the last 168 hours. No results for input(s): "AMMONIA" in the last 168 hours.  CBC: Recent Labs  Lab 06/01/23 0656 06/02/23 0643 06/03/23 0510 06/04/23 0540 06/05/23 0458  WBC 5.3 5.4 4.4 4.8 4.2  HGB 8.5* 8.3* 8.6* 8.2* 7.8*  HCT 26.6* 25.8* 27.0* 25.6* 24.8*  MCV 98.5  96.6 100.4* 100.4* 100.4*  PLT 155 183 191 199 197     HbA1C: Hemoglobin A1C  Date/Time Value Ref Range Status  08/07/2022 12:00 AM 6.1  Final  12/18/2021 11:59 AM 5.9 (A) 4.0 - 5.6 % Final  10/05/2019 04:48 PM 5.6  Final   Hgb A1c MFr Bld  Date/Time Value Ref Range Status  05/20/2023 12:51 AM 4.9 4.8 - 5.6 % Final    Comment:    (NOTE) Pre diabetes:          5.7%-6.4%  Diabetes:              >6.4%  Glycemic control for   <7.0% adults with diabetes   05/19/2021 06:02 AM 7.2 (H) 4.8 - 5.6 % Final    Comment:    (NOTE)         Prediabetes: 5.7 - 6.4         Diabetes: >6.4         Glycemic control for adults with diabetes: <7.0     Urinalysis: No results for input(s): "COLORURINE", "LABSPEC", "PHURINE", "GLUCOSEU", "HGBUR", "BILIRUBINUR", "KETONESUR", "PROTEINUR", "UROBILINOGEN", "NITRITE", "LEUKOCYTESUR" in the last 72 hours.  Invalid input(s): "APPERANCEUR"    Imaging: No results found.   Medications:  sodium chloride 50 mL/hr at 06/06/23 0300    acetaminophen  650 mg Oral Q8H   vitamin C  500 mg Oral BID   atorvastatin  20 mg Oral Daily   azaTHIOprine  100 mg Oral Daily   cloNIDine  0.1 mg Oral BID   clopidogrel  75 mg Oral Daily   vitamin B-12  500 mcg Oral Daily   enoxaparin (LOVENOX) injection  40 mg Subcutaneous Q24H   feeding supplement  1 Container Oral TID BM   folic acid  1 mg Oral Daily   insulin aspart  0-5 Units Subcutaneous QHS   insulin aspart  0-6 Units Subcutaneous TID WC   isosorbide mononitrate  60 mg Oral Daily   leptospermum manuka honey  1 Application Topical Daily   losartan  25 mg Oral Daily   metoprolol tartrate  50 mg Oral BID   multivitamin with minerals  1 tablet Oral Daily   OLANZapine zydis  10 mg Oral QHS   pantoprazole  40 mg Oral Daily   pregabalin  75 mg Oral BID   tacrolimus ER  10 mg Oral QAC breakfast   traZODone  150 mg Oral QHS   Vitamin D (Ergocalciferol)  50,000 Units Oral Q7 days    Assessment/ Plan:      [name] is a 67 y.o.  female with past medical conditions including COPD, anemia, diabetes, CAD, hyperlipidemia, status post renal transplant, hypertension, who was admitted to [facility] on 05/19/2023 for Arterial occlusion [I70.90] Diabetes mellitus with foot ulcer and gangrene (HCC) [X52.841, L97.509, E11.52] Gangrene of right foot (HCC) [I96]  #1: Acute kidney injury on: Chronic kidney disease: Renal indices stabilized with a creatinine of 1.3.  History of cadaveric renal transplant about 3 years ago.  Will continue azathioprine and tacrolimus as ordered.  #2: Hypertension: Will continue clonidine, losartan and metoprolol.  She is advised on importance of 2 g salt restricted diet.  #3: Anemia: Patient has anemia secondary to chronic kidney disease versus iron deficiency.  Advised to continue iron tablets.  #4: Right foot gangrene: Status post right AKA.  Stable from renal standpoint. Labs and medications reviewed. Will continue to follow along with you.   LOS: 18 Theresa Childes, MD [facility] 12/28/20244:07 PM

## 2023-06-06 NOTE — Plan of Care (Signed)
  Problem: Coping: Goal: Ability to adjust to condition or change in health will improve Outcome: Progressing   Problem: Metabolic: Goal: Ability to maintain appropriate glucose levels will improve Outcome: Progressing   Problem: Nutritional: Goal: Maintenance of adequate nutrition will improve Outcome: Progressing   Problem: Tissue Perfusion: Goal: Adequacy of tissue perfusion will improve Outcome: Progressing   Problem: Clinical Measurements: Goal: Will remain free from infection Outcome: Progressing Goal: Diagnostic test results will improve Outcome: Progressing Goal: Respiratory complications will improve Outcome: Progressing Goal: Cardiovascular complication will be avoided Outcome: Progressing   Problem: Activity: Goal: Risk for activity intolerance will decrease Outcome: Progressing   Problem: Nutrition: Goal: Adequate nutrition will be maintained Outcome: Progressing   Problem: Coping: Goal: Level of anxiety will decrease Outcome: Progressing   Problem: Pain Management: Goal: General experience of comfort will improve Outcome: Progressing   Problem: Safety: Goal: Ability to remain free from injury will improve Outcome: Progressing

## 2023-06-06 NOTE — Plan of Care (Signed)

## 2023-06-06 NOTE — TOC Progression Note (Signed)
Transition of Care [facility]) - Progression Note    Patient Details  Name: Theresa [name] MRN: [id number] Date of Birth: Feb 17, 1956  Transition of Care [facility]) CM/SW Contact  Bing Quarry, RN Phone Number: 06/06/2023, 5:29 PM  Clinical Narrative:  12/28: Ongoing medical treatment/issues as barrier to discharge readiness per provider notes.  TOC to continue to follow.   Gabriel Cirri MSN RN CM  Care Management Department.  [place]  [facility] Direct Dial: [phone] (Weekends Only) [facility] Main Office Phone: [phone] [facility] Fax: [phone]       Barriers to Discharge: Continued Medical Work up  Expected Discharge Plan and Services                                               Social Determinants of Health (SDOH) Interventions SDOH Screenings   Food Insecurity: Patient Unable To Answer (05/20/2023)  Housing: High Risk (05/20/2023)  Transportation Needs: Patient Unable To Answer (05/20/2023)  Utilities: Patient Unable To Answer (05/20/2023)  Alcohol Screen: Low Risk  (12/17/2022)  Depression (PHQ2-9): Medium Risk (12/31/2022)  Financial Resource Strain: Low Risk  (02/13/2023)   Received from [facility]  Physical Activity: Inactive (12/17/2022)  Social Connections: Socially Isolated (12/17/2022)  Stress: No Stress Concern Present (12/17/2022)  Tobacco Use: Medium Risk (05/21/2023)  Health Literacy: Low Risk  (09/13/2020)   Received from [facility]    Readmission Risk Interventions     No data to display

## 2023-06-06 NOTE — Progress Notes (Signed)
PROGRESS NOTE    Theresa [name]  GGY:[id number] DOB: 02/06/1956 DOA: 05/19/2023 PCP: Dana Allan, MD    Brief Narrative:   HPI on admission: "Theresa [name] is a 67 y.o. female with medical history significant of  Anemia of chronic disease, COPD coronary artery disease status post stent placement, Diabetes mellitus with diabetic neuropathy and retinopathy, previous history of ESRD on hemodialysis currently status post transplant, hyperlipidemia, Hypertension moderate to severe mitral regurgitation, peripheral vascular disease who is a resident in a group home brought in on account of worsening gangrene noted on the right foot.  ..." See H&P for full HPI on admission & ED course.   Patient was admitted to the hospital, started on IV heparin and Vascular Surgery was consulted, plan for angiogram today (12/11).   Further hospital course and management as outlined below.   Assessment & Plan:   Principal Problem:   Diabetes mellitus with foot ulcer and gangrene (HCC) Active Problems:   Dementia (HCC)   Gangrene of right foot (HCC)   Arterial occlusion  Right foot gangrene, s/p AKA done on 12/12 Vascular surgeon on board and case discussed On 05/20/2023 patient underwent angiogram with angioplasty to right SFA and popliteal arteries Patient underwent a right AKA on 05/21/2023 Continue Plavix Continue PT OT   AKI in a patient with history of ESRD  Patient used to be on on hemodialysis currently status post transplant No longer on hemodialysis Renal function stable at baseline --On tacrolimus and azathioprine Plan: Continue IV fluids for now Defer to nephrology on further management    Acute metabolic encephalopathy versus delirium Continue delirium precaution    Thrombocytopenia -- appears acute on chronic. Worsening since admission, platelets now in 70's --held Lovenox and resumed after improvement in the platelet count --Also on Plavix - continue for now Monitor CBC    Hypokalemia - resolved with replacement --Monitor BMP, replace K PRN   Hyperkalemia - K 5.8 on 12/12 treated and resolved Monitor potassium levels   Hypovolemic hyponatremia - mild, Na 132 on admission.   Pt appeared clinically dehydrated. --Encourage oral hydration --IV fluids --Monitor BMP   Anemia of chronic disease Status post 1 unit blood transfusion Monitor CBC closely   COPD - stable, not exacerbated -- Continue PRN bronchodilators   Coronary artery disease status post stent placement Continue statin therapy   Diabetes mellitus with diabetic neuropathy and retinopathy --Monitor CBG's --Sliding scale Novolog Continue Lyrica   Hypoglycemia - CBG noted 44 on evening of 12/13.  Treated an improved to 175 --Hypoglycemia protocol     Severe hypoalbuminemia: Continue protein supplement Dietitian consulted   Hyperlipidemia: Continue Lipitor   Hypertension:  Continue current antihypertensives   Vitamin D insufficiency: Continue vitamin D supplementation   Moderate to severe mitral regurgitation Continue to monitor volume status   Peripheral vascular disease, now s/p R AKA Continue on Plavix per vascular    Goals of care discussion, palliative care consulted on 12/20 Overall poor prognosis due to above comorbidities   Psych consulted to evaluate for decision-making capacity. According to psychiatry patient does not have decision-making capabilities Likely not a candidate for inpatient psychiatry   DVT prophylaxis: Lovenox Code Status: DNR Family Communication: None today Disposition Plan: Status is: Inpatient Remains inpatient appropriate because: Unsafe discharge plan.  AKI   Level of care: Progressive  Consultants:  Nephrology  Procedures:  None  Antimicrobials: None   Subjective: Seen and examined.  Affect remains flattened.  No visible distress.  No complaints.  Objective: Vitals:   06/06/23 0248 06/06/23 0325 06/06/23 0815 06/06/23  1154  BP: (!) 170/55 (!) 145/56 (!) 176/52 (!) 171/48  Pulse: 72 70 75 78  Resp: 17  15 16   Temp: 99.4 F (37.4 C)  98.6 F (37 C) 99.1 F (37.3 C)  TempSrc: Oral     SpO2: 98%  90% 100%  Weight:      Height:        Intake/Output Summary (Last 24 hours) at 06/06/2023 1206 Last data filed at 06/06/2023 0326 Gross per 24 hour  Intake 729 ml  Output --  Net 729 ml   Filed Weights   05/19/23 1643 05/20/23 1234 05/21/23 1019  Weight: 66.2 kg 65 kg 65 kg    Examination:  General exam: Appears frail Respiratory system: Clear to auscultation. Respiratory effort normal. Cardiovascular system: S1-S2, RRR, no murmurs, no pedal edema Gastrointestinal system: Soft, NT/ND, normal bowel sounds Central nervous system: Alert and oriented x 2. No focal neurological deficits. Extremities: Right AKA Skin: No rashes, lesions or ulcers Psychiatry: Judgement and insight appear impaired. Mood & affect flattened.     Data Reviewed: I have personally reviewed following labs and imaging studies  CBC: Recent Labs  Lab 06/01/23 0656 06/02/23 0643 06/03/23 0510 06/04/23 0540 06/05/23 0458  WBC 5.3 5.4 4.4 4.8 4.2  HGB 8.5* 8.3* 8.6* 8.2* 7.8*  HCT 26.6* 25.8* 27.0* 25.6* 24.8*  MCV 98.5 96.6 100.4* 100.4* 100.4*  PLT 155 183 191 199 197   Basic Metabolic Panel: Recent Labs  Lab 06/01/23 0656 06/02/23 0643 06/03/23 0510 06/04/23 0540 06/05/23 0458 06/06/23 0807  NA 136 136 137 137 139 141  K 4.5 4.7 4.7 4.7 4.5 4.8  CL 109 108 109 111 112* 117*  CO2 20* 20* 21* 19* 17* 17*  GLUCOSE 95 93 91 78 98 107*  BUN 23 28* 28* 27* 27* 27*  CREATININE 1.51* 1.66* 1.53* 1.35* 1.32* 1.31*  CALCIUM 8.3* 8.5* 8.3* 8.1* 8.2* 8.3*  MG 2.4 2.4 2.4  --   --   --   PHOS 3.4 3.6 3.5  --   --   --    GFR: Estimated Creatinine Clearance: 41.6 mL/min (A) (by C-G formula based on SCr of 1.31 mg/dL (H)). Liver Function Tests: No results for input(s): "AST", "ALT", "ALKPHOS", "BILITOT", "PROT",  "ALBUMIN" in the last 168 hours. No results for input(s): "LIPASE", "AMYLASE" in the last 168 hours. No results for input(s): "AMMONIA" in the last 168 hours. Coagulation Profile: No results for input(s): "INR", "PROTIME" in the last 168 hours. Cardiac Enzymes: No results for input(s): "CKTOTAL", "CKMB", "CKMBINDEX", "TROPONINI" in the last 168 hours. BNP (last 3 results) No results for input(s): "PROBNP" in the last 8760 hours. HbA1C: No results for input(s): "HGBA1C" in the last 72 hours. CBG: Recent Labs  Lab 06/05/23 1235 06/05/23 1554 06/05/23 1955 06/06/23 0818 06/06/23 1139  GLUCAP 96 108* 111* 107* 107*   Lipid Profile: No results for input(s): "CHOL", "HDL", "LDLCALC", "TRIG", "CHOLHDL", "LDLDIRECT" in the last 72 hours. Thyroid Function Tests: No results for input(s): "TSH", "T4TOTAL", "FREET4", "T3FREE", "THYROIDAB" in the last 72 hours. Anemia Panel: No results for input(s): "VITAMINB12", "FOLATE", "FERRITIN", "TIBC", "IRON", "RETICCTPCT" in the last 72 hours. Sepsis Labs: No results for input(s): "PROCALCITON", "LATICACIDVEN" in the last 168 hours.  No results found for this or any previous visit (from the past 240 hours).       Radiology Studies: No results found.  Scheduled Meds:  acetaminophen  650 mg Oral Q8H   vitamin C  500 mg Oral BID   atorvastatin  20 mg Oral Daily   azaTHIOprine  100 mg Oral Daily   cloNIDine  0.1 mg Oral BID   clopidogrel  75 mg Oral Daily   vitamin B-12  500 mcg Oral Daily   enoxaparin (LOVENOX) injection  40 mg Subcutaneous Q24H   feeding supplement  1 Container Oral TID BM   folic acid  1 mg Oral Daily   insulin aspart  0-5 Units Subcutaneous QHS   insulin aspart  0-6 Units Subcutaneous TID WC   isosorbide mononitrate  60 mg Oral Daily   leptospermum manuka honey  1 Application Topical Daily   losartan  25 mg Oral Daily   metoprolol tartrate  50 mg Oral BID   multivitamin with minerals  1 tablet Oral Daily    OLANZapine zydis  10 mg Oral QHS   pantoprazole  40 mg Oral Daily   pregabalin  75 mg Oral BID   tacrolimus ER  10 mg Oral QAC breakfast   traZODone  150 mg Oral QHS   Vitamin D (Ergocalciferol)  50,000 Units Oral Q7 days   Continuous Infusions:  sodium chloride 50 mL/hr at 06/06/23 0300     LOS: 18 days      Tresa Moore, MD [facility]   If 7PM-7AM, please contact night-coverage  06/06/2023, 12:06 PM

## 2023-06-07 DIAGNOSIS — L97509 Non-pressure chronic ulcer of other part of unspecified foot with unspecified severity: Secondary | ICD-10-CM | POA: Diagnosis not present

## 2023-06-07 DIAGNOSIS — E11621 Type 2 diabetes mellitus with foot ulcer: Secondary | ICD-10-CM | POA: Diagnosis not present

## 2023-06-07 DIAGNOSIS — E1152 Type 2 diabetes mellitus with diabetic peripheral angiopathy with gangrene: Secondary | ICD-10-CM | POA: Diagnosis not present

## 2023-06-07 LAB — GLUCOSE, CAPILLARY: Glucose-Capillary: 117 mg/dL — ABNORMAL HIGH (ref 70–99)

## 2023-06-08 DIAGNOSIS — Z940 Kidney transplant status: Principal | ICD-10-CM

## 2023-06-10 NOTE — Progress Notes (Addendum)
0800 patient alert to self seeing things that are not there verbally aggressive at times patient on room air able to make needs known refusing to eat refusing mobility refusing protein supplement drinks.  0900 Patient will take meds if they are whole she took all meds PO this morning  1400-1430 wound care completed patient helped roll to each side still refusing to eat lunch but did drink some diet coke  1555 went in to change IV dressing Patient unresponsive with no pulse notified MD and charge  1606 MD pronounced patient attending called family (son Theresa [name])  [phone] [facility] called patient possible eye and skin candidate eyes prepped   1715 family arrived

## 2023-06-10 NOTE — Progress Notes (Signed)
[facility]  PROGRESS NOTE   Subjective:   Patient seen at bedside.  Denies any chest pain or shortness of breath.  Blood pressure has been elevated.  Objective:  Vital signs: Blood pressure (!) 182/63, pulse 81, temperature 99.1 F (37.3 C), temperature source Oral, resp. rate 16, height 5\' 7"  (1.702 m), weight 65 kg, SpO2 97%.  Intake/Output Summary (Last 24 hours) at 05/22/2023 1403 Last data filed at 05/16/2023 0901 Gross per 24 hour  Intake 1478.26 ml  Output --  Net 1478.26 ml   Filed Weights   05/19/23 1643 05/20/23 1234 05/21/23 1019  Weight: 66.2 kg 65 kg 65 kg     Physical Exam: General:  No acute distress  Head:  Normocephalic, atraumatic. Moist oral mucosal membranes  Eyes:  Anicteric  Neck:  Supple  Lungs:   Clear to auscultation, normal effort  Heart:  S1S2 no rubs  Abdomen:   Soft, nontender, bowel sounds present  Extremities:  peripheral edema.  Neurologic:  Awake, alert, following commands  Skin:  No lesions  Access:     Basic Metabolic Panel: Recent Labs  Lab 06/01/23 0656 06/02/23 0643 06/03/23 0510 06/04/23 0540 06/05/23 0458 06/06/23 0807  NA 136 136 137 137 139 141  K 4.5 4.7 4.7 4.7 4.5 4.8  CL 109 108 109 111 112* 117*  CO2 20* 20* 21* 19* 17* 17*  GLUCOSE 95 93 91 78 98 107*  BUN 23 28* 28* 27* 27* 27*  CREATININE 1.51* 1.66* 1.53* 1.35* 1.32* 1.31*  CALCIUM 8.3* 8.5* 8.3* 8.1* 8.2* 8.3*  MG 2.4 2.4 2.4  --   --   --   PHOS 3.4 3.6 3.5  --   --   --    GFR: Estimated Creatinine Clearance: 41.6 mL/min (A) (by C-G formula based on SCr of 1.31 mg/dL (H)).  Liver Function Tests: No results for input(s): "AST", "ALT", "ALKPHOS", "BILITOT", "PROT", "ALBUMIN" in the last 168 hours. No results for input(s): "LIPASE", "AMYLASE" in the last 168 hours. No results for input(s): "AMMONIA" in the last 168 hours.  CBC: Recent Labs  Lab 06/01/23 0656 06/02/23 0643 06/03/23 0510 06/04/23 0540 06/05/23 0458  WBC 5.3 5.4  4.4 4.8 4.2  HGB 8.5* 8.3* 8.6* 8.2* 7.8*  HCT 26.6* 25.8* 27.0* 25.6* 24.8*  MCV 98.5 96.6 100.4* 100.4* 100.4*  PLT 155 183 191 199 197     HbA1C: Hemoglobin A1C  Date/Time Value Ref Range Status  08/07/2022 12:00 AM 6.1  Final  12/18/2021 11:59 AM 5.9 (A) 4.0 - 5.6 % Final  10/05/2019 04:48 PM 5.6  Final   Hgb A1c MFr Bld  Date/Time Value Ref Range Status  05/20/2023 12:51 AM 4.9 4.8 - 5.6 % Final    Comment:    (NOTE) Pre diabetes:          5.7%-6.4%  Diabetes:              >6.4%  Glycemic control for   <7.0% adults with diabetes   05/19/2021 06:02 AM 7.2 (H) 4.8 - 5.6 % Final    Comment:    (NOTE)         Prediabetes: 5.7 - 6.4         Diabetes: >6.4         Glycemic control for adults with diabetes: <7.0     Urinalysis: No results for input(s): "COLORURINE", "LABSPEC", "PHURINE", "GLUCOSEU", "HGBUR", "BILIRUBINUR", "KETONESUR", "PROTEINUR", "UROBILINOGEN", "NITRITE", "LEUKOCYTESUR" in the last 72 hours.  Invalid input(s): "  APPERANCEUR"    Imaging: No results found.   Medications:     acetaminophen  650 mg Oral Q8H   vitamin C  500 mg Oral BID   atorvastatin  20 mg Oral Daily   azaTHIOprine  100 mg Oral Daily   cloNIDine  0.1 mg Oral BID   clopidogrel  75 mg Oral Daily   vitamin B-12  500 mcg Oral Daily   enoxaparin (LOVENOX) injection  40 mg Subcutaneous Q24H   feeding supplement  1 Container Oral TID BM   folic acid  1 mg Oral Daily   insulin aspart  0-5 Units Subcutaneous QHS   insulin aspart  0-6 Units Subcutaneous TID WC   isosorbide mononitrate  60 mg Oral Daily   leptospermum manuka honey  1 Application Topical Daily   losartan  25 mg Oral Daily   metoprolol tartrate  50 mg Oral BID   multivitamin with minerals  1 tablet Oral Daily   OLANZapine zydis  10 mg Oral QHS   pantoprazole  40 mg Oral Daily   pregabalin  75 mg Oral BID   tacrolimus ER  10 mg Oral QAC breakfast   traZODone  150 mg Oral QHS   Vitamin D (Ergocalciferol)  50,000  Units Oral Q7 days    Assessment/ Plan:     [name] is a 68 y.o.  female with past medical conditions including COPD, anemia, diabetes, CAD, hyperlipidemia, status post renal transplant, hypertension, who was admitted to [facility] on 05/19/2023 for Arterial occlusion [I70.90] Diabetes mellitus with foot ulcer and gangrene (HCC) [C37.628, L97.509, E11.52] Gangrene of right foot (HCC) [I96]   #1: Acute kidney injury on: Chronic kidney disease: Renal indices stabilized with a creatinine of 1.3.  History of cadaveric renal transplant about 3 years ago.  Will continue azathioprine and tacrolimus as ordered.   #2: Hypertension: Will continue clonidine, losartan and metoprolol.  She is advised on importance of 2 g salt restricted diet.  May need to add amlodipine.   #3: Anemia: Patient has anemia secondary to chronic kidney disease versus iron deficiency.  Advised to continue iron tablets.   #4: Right foot gangrene: Status post right AKA.  Labs and medications reviewed. Will continue to follow along with you.   LOS: 19 Lorain Childes, MD [facility] 12/29/20242:03 PM

## 2023-06-10 NOTE — TOC Progression Note (Signed)
Transition of Care [facility]) - Progression Note    Patient Details  Name: Theresa [name] MRN: [id number] Date of Birth: [birth year]  Transition of Care [facility]) CM/SW Contact  Bing Quarry, RN Phone Number: 06/04/2023, 11:37 AM  Clinical Narrative: 12/29: Per Unit RN, patient has been having visual things that are not there, being agressive, not eating, and refusing protein drinks. Insurance authorization will need to be started when more medically stable. Pending discharge to [facility] bed offer chosen by son. Archie Patten was contacted 06/05/23 regarding bed availability.   TOC to follow through final disposition.   Please contact TOC RN CM today at [phone] if further/additional needs arise. Thank you.   Gabriel Cirri MSN RN CM  Care Management Department.  [place]  [facility] Direct Dial: [phone] (Weekends Only) [facility] Main Office Phone: [phone] [facility] Fax: [phone]        Barriers to Discharge: Continued Medical Work up  Expected Discharge Plan and Services                                               Social Determinants of Health (SDOH) Interventions SDOH Screenings   Food Insecurity: Patient Unable To Answer (05/20/2023)  Housing: High Risk (05/20/2023)  Transportation Needs: Patient Unable To Answer (05/20/2023)  Utilities: Patient Unable To Answer (05/20/2023)  Alcohol Screen: Low Risk  (12/17/2022)  Depression (PHQ2-9): Medium Risk (12/31/2022)  Financial Resource Strain: Low Risk  (02/13/2023)   Received from [facility]  Physical Activity: Inactive (12/17/2022)  Social Connections: Socially Isolated (12/17/2022)  Stress: No Stress Concern Present (12/17/2022)  Tobacco Use: Medium Risk (05/21/2023)  Health Literacy: Low Risk  (09/13/2020)   Received from [facility]    Readmission Risk Interventions     No data to display

## 2023-06-10 NOTE — Death Summary Note (Signed)
DEATH SUMMARY   Patient Details  Name: Theresa [name] MRN: [id number] DOB: Apr 23, 1956 NWG:NFAOZ, Kenney Houseman, MD Admission/Discharge Information   Admit Date:  05/26/23  Date of Death: Date of Death: Jun 14, 2023  Time of Death: Time of Death: Jun 20, 1604  Length of Stay: 07/05/2023   Principle Cause of death: Diabetes mellitus with gas gangrene  Hospital Diagnoses: Principal Problem:   Diabetes mellitus with foot ulcer and gangrene (HCC) Active Problems:   Dementia (HCC)   Gangrene of right foot (HCC)   Arterial occlusion   Hospital Course: No notes on file HPI on admission: "TAFFANY GERWIN is a 68 y.o. female with medical history significant of  Anemia of chronic disease, COPD coronary artery disease status post stent placement, Diabetes mellitus with diabetic neuropathy and retinopathy, previous history of ESRD on hemodialysis currently status post transplant, hyperlipidemia, Hypertension moderate to severe mitral regurgitation, peripheral vascular disease who is a resident in a group home brought in on account of worsening gangrene noted on the right foot.  ..." See H&P for full HPI on admission & ED course.   Patient was admitted to the hospital, started on IV heparin and Vascular Surgery was consulted, plan for angiogram  After angiogram vascular surgeon discussed the case with patient's family.  Decision made to proceed with right side AKA due to inability to restore vascular flow to right SFA and popliteal arteries.  Patient remained relatively stable post procedurally.  Nephrology was involved due to AKI and patient with history of ESRD status post transplant.  Renal function did stabilize with creatinine approximately 1.3.  Patient remained on home immunosuppressive regimen of tacrolimus and azathioprine.  Did experience some encephalopathy versus delirium during hospital course.  On day of in-hospital demise patient was calm, cooperative with exam and interview.  She did answer my interview  questions appropriately.  No signs of delirium or encephalopathy on day of demise.  On 13-Jun-2024 at approximately 1430 wound care was completed and patient helped roll to each side.  Her oral intake remained poor but did drink some fluids.  At approximately 1555 the RN presented to bedside to change IV dressing.  At this time patient was unresponsive with no pulse.  MD and charge notified.  MD pronounced patient demise at 29.  Family was contacted.  Presented to bedside.  On a bridge called.  Family arrived at 53.   Assessment and Plan: No notes have been filed under this hospital service. Service: Hospitalist        Procedures: RLE AKA  Consultations: Vascular surgery  The results of significant diagnostics from this hospitalization (including imaging, microbiology, ancillary and laboratory) are listed below for reference.   Significant Diagnostic Studies: PERIPHERAL VASCULAR CATHETERIZATION Result Date: 05/20/2023 See surgical note for result.  DG Foot Complete Right Result Date: 05/26/23 CLINICAL DATA:  Ischemia. Right foot infection. Numerous mount of decay and eschar of right foot. Foot is visibly indicating, skin is brown, black, and leathery all the way up the lateral side of the tibia/fibula. EXAM: RIGHT FOOT COMPLETE - 3+ VIEW COMPARISON:  Right foot radiographs 02/13/2015 FINDINGS: There is diffuse decreased bone mineralization. Interval marked attenuation of the forefoot soft tissues, especially around all of the toes. Moderate to severe first through fifth interphalangeal joint space narrowing, subchondral sclerosis, and peripheral osteophytosis. Moderate great toe metatarsophalangeal joint space narrowing. Tiny plantar calcaneal heel spur. Mild dorsal talar neck degenerative osteophytosis. No definite cortical erosion is identified. No acute fracture or dislocation. Moderate atherosclerotic calcifications. IMPRESSION:  1. Interval marked attenuation of the forefoot soft  tissues, especially around all of the toes. 2. 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 3. Moderate great toe metatarsophalangeal joint osteoarthritis. 4. No definite cortical erosion is identified. Electronically Signed   By: Neita Garnet M.D.   On: 05/19/2023 17:02    Microbiology: No results found for this or any previous visit (from the past 240 hours).  Time spent: 30 minutes  Signed: Tresa Moore, MD 05/28/2023

## 2023-06-10 NOTE — Progress Notes (Signed)
PROGRESS NOTE    Theresa [name]  WUX:[id number] DOB: 04/07/1956 DOA: 05/19/2023 PCP: Dana Allan, MD    Brief Narrative:   HPI on admission: "Theresa [name] is a 68 y.o. female with medical history significant of  Anemia of chronic disease, COPD coronary artery disease status post stent placement, Diabetes mellitus with diabetic neuropathy and retinopathy, previous history of ESRD on hemodialysis currently status post transplant, hyperlipidemia, Hypertension moderate to severe mitral regurgitation, peripheral vascular disease who is a resident in a group home brought in on account of worsening gangrene noted on the right foot.  ..." See H&P for full HPI on admission & ED course.   Patient was admitted to the hospital, started on IV heparin and Vascular Surgery was consulted, plan for angiogram today (12/11).   Further hospital course and management as outlined below.   Assessment & Plan:   Principal Problem:   Diabetes mellitus with foot ulcer and gangrene (HCC) Active Problems:   Dementia (HCC)   Gangrene of right foot (HCC)   Arterial occlusion  Right foot gangrene, s/p AKA done on 12/12 Vascular surgeon on board and case discussed On 05/20/2023 patient underwent angiogram with angioplasty to right SFA and popliteal arteries Patient underwent a right AKA on 05/21/2023 Continue Plavix Continue PT OT   AKI in a patient with history of ESRD  Patient used to be on on hemodialysis currently status post transplant No longer on hemodialysis Renal function stable at baseline --On tacrolimus and azathioprine Plan: Kidney function appears to stabilized.  Discontinue IV fluids.  Continue immunosuppressive therapies.    Acute metabolic encephalopathy versus delirium Continue delirium precaution Suspect more hospital-acquired delirium at this point Nothing acutely metabolic or reversible Patient will benefit from hospital discharge In the meantime pursue delirium  precautions Medically stable for discharge    Thrombocytopenia -- appears acute on chronic. Worsening since admission, platelets now in 70's --held Lovenox and resumed after improvement in the platelet count --Also on Plavix - continue for now Monitor CBC   Hypokalemia - resolved with replacement --Monitor BMP, replace K PRN   Hyperkalemia - K 5.8 on 12/12 treated and resolved Monitor potassium levels   Hypovolemic hyponatremia - mild, Na 132 on admission.   Pt appeared clinically dehydrated. --Encourage oral hydration -- IV fluids discontinued   Anemia of chronic disease Status post 1 unit blood transfusion Monitor CBC closely   COPD - stable, not exacerbated -- Continue PRN bronchodilators   Coronary artery disease status post stent placement Continue statin therapy   Diabetes mellitus with diabetic neuropathy and retinopathy --Monitor CBG's --Sliding scale Novolog Continue Lyrica   Hypoglycemia - CBG noted 44 on evening of 12/13.  Treated an improved to 175 --Hypoglycemia protocol     Severe hypoalbuminemia: Continue protein supplement Dietitian consulted   Hyperlipidemia: Continue Lipitor   Hypertension:  Continue current antihypertensives   Vitamin D insufficiency: Continue vitamin D supplementation   Moderate to severe mitral regurgitation Continue to monitor volume status   Peripheral vascular disease, now s/p R AKA Continue on Plavix per vascular    Goals of care discussion, palliative care consulted on 12/20 Overall poor prognosis due to above comorbidities   Psych consulted to evaluate for decision-making capacity. According to psychiatry patient does not have decision-making capabilities Likely not a candidate for inpatient psychiatry   DVT prophylaxis: Lovenox Code Status: DNR Family Communication: None today Disposition Plan: Status is: Inpatient Remains inpatient appropriate because: Unsafe discharge plan.  Medically appropriate for  discharge  to skilled nursing facility.   Level of care: Progressive  Consultants:  Nephrology  Procedures:  None  Antimicrobials: None   Subjective: Seen and examined.  Affect flattened.  No visible distress.  Does answer questions appropriately.  Objective: Vitals:   06/06/23 2355 05/27/2023 0410 06/04/2023 0724 06/05/2023 0727  BP: (!) 174/57 (!) 169/54 (!) 182/64 (!) 182/64  Pulse: 79 77 98 89  Resp: 18 16    Temp: 98.8 F (37.1 C) 98.7 F (37.1 C) 98.6 F (37 C) 98.6 F (37 C)  TempSrc: Oral Oral Oral Oral  SpO2: 99% 100% 99% 99%  Weight:      Height:        Intake/Output Summary (Last 24 hours) at 05/14/2023 1203 Last data filed at 05/31/2023 0901 Gross per 24 hour  Intake 1478.26 ml  Output --  Net 1478.26 ml   Filed Weights   05/19/23 1643 05/20/23 1234 05/21/23 1019  Weight: 66.2 kg 65 kg 65 kg    Examination:  General exam: Appears frail Respiratory system: Clear.  Normal work of breathing.  Room air Cardiovascular system: S1-S2, RRR, no murmurs, no pedal edema Gastrointestinal system: Soft, NT/ND, normal bowel sounds Central nervous system: Alert and oriented x 2. No focal neurological deficits. Extremities: Right AKA Skin: No rashes, lesions or ulcers Psychiatry: Judgement and insight appear impaired. Mood & affect flattened.     Data Reviewed: I have personally reviewed following labs and imaging studies  CBC: Recent Labs  Lab 06/01/23 0656 06/02/23 0643 06/03/23 0510 06/04/23 0540 06/05/23 0458  WBC 5.3 5.4 4.4 4.8 4.2  HGB 8.5* 8.3* 8.6* 8.2* 7.8*  HCT 26.6* 25.8* 27.0* 25.6* 24.8*  MCV 98.5 96.6 100.4* 100.4* 100.4*  PLT 155 183 191 199 197   Basic Metabolic Panel: Recent Labs  Lab 06/01/23 0656 06/02/23 0643 06/03/23 0510 06/04/23 0540 06/05/23 0458 06/06/23 0807  NA 136 136 137 137 139 141  K 4.5 4.7 4.7 4.7 4.5 4.8  CL 109 108 109 111 112* 117*  CO2 20* 20* 21* 19* 17* 17*  GLUCOSE 95 93 91 78 98 107*  BUN 23 28*  28* 27* 27* 27*  CREATININE 1.51* 1.66* 1.53* 1.35* 1.32* 1.31*  CALCIUM 8.3* 8.5* 8.3* 8.1* 8.2* 8.3*  MG 2.4 2.4 2.4  --   --   --   PHOS 3.4 3.6 3.5  --   --   --    GFR: Estimated Creatinine Clearance: 41.6 mL/min (A) (by C-G formula based on SCr of 1.31 mg/dL (H)). Liver Function Tests: No results for input(s): "AST", "ALT", "ALKPHOS", "BILITOT", "PROT", "ALBUMIN" in the last 168 hours. No results for input(s): "LIPASE", "AMYLASE" in the last 168 hours. No results for input(s): "AMMONIA" in the last 168 hours. Coagulation Profile: No results for input(s): "INR", "PROTIME" in the last 168 hours. Cardiac Enzymes: No results for input(s): "CKTOTAL", "CKMB", "CKMBINDEX", "TROPONINI" in the last 168 hours. BNP (last 3 results) No results for input(s): "PROBNP" in the last 8760 hours. HbA1C: No results for input(s): "HGBA1C" in the last 72 hours. CBG: Recent Labs  Lab 06/05/23 1955 06/06/23 0818 06/06/23 1139 06/06/23 1717 06/06/23 2057  GLUCAP 111* 107* 107* 122* 101*   Lipid Profile: No results for input(s): "CHOL", "HDL", "LDLCALC", "TRIG", "CHOLHDL", "LDLDIRECT" in the last 72 hours. Thyroid Function Tests: No results for input(s): "TSH", "T4TOTAL", "FREET4", "T3FREE", "THYROIDAB" in the last 72 hours. Anemia Panel: No results for input(s): "VITAMINB12", "FOLATE", "FERRITIN", "TIBC", "IRON", "RETICCTPCT" in the last 72  hours. Sepsis Labs: No results for input(s): "PROCALCITON", "LATICACIDVEN" in the last 168 hours.  No results found for this or any previous visit (from the past 240 hours).       Radiology Studies: No results found.      Scheduled Meds:  acetaminophen  650 mg Oral Q8H   vitamin C  500 mg Oral BID   atorvastatin  20 mg Oral Daily   azaTHIOprine  100 mg Oral Daily   cloNIDine  0.1 mg Oral BID   clopidogrel  75 mg Oral Daily   vitamin B-12  500 mcg Oral Daily   enoxaparin (LOVENOX) injection  40 mg Subcutaneous Q24H   feeding supplement  1  Container Oral TID BM   folic acid  1 mg Oral Daily   insulin aspart  0-5 Units Subcutaneous QHS   insulin aspart  0-6 Units Subcutaneous TID WC   isosorbide mononitrate  60 mg Oral Daily   leptospermum manuka honey  1 Application Topical Daily   losartan  25 mg Oral Daily   metoprolol tartrate  50 mg Oral BID   multivitamin with minerals  1 tablet Oral Daily   OLANZapine zydis  10 mg Oral QHS   pantoprazole  40 mg Oral Daily   pregabalin  75 mg Oral BID   tacrolimus ER  10 mg Oral QAC breakfast   traZODone  150 mg Oral QHS   Vitamin D (Ergocalciferol)  50,000 Units Oral Q7 days   Continuous Infusions:     LOS: 19 days      Tresa Moore, MD [facility]   If 7PM-7AM, please contact night-coverage  05/27/2023, 12:03 PM

## 2023-06-10 NOTE — Progress Notes (Signed)
Chaplain paged for pt death-family declined visit. Daughter and multiple family members at bedside-introduced chaplain services to daughter-declined needs at this time. Chaplain explained staff can call me at anytime if needs change

## 2023-06-10 NOTE — TOC Progression Note (Signed)
Transition of Care [facility]) - Progression Note    Patient Details  Name: Theresa [name] MRN: [id number] Date of Birth: [birth year]  [facility]) CM/SW Contact  Bing Quarry, RN Phone Number: 05/24/2023, 12:53 PM  Clinical Narrative:   12/29: Insurance authorization started.  ID #[id number].   Gabriel Cirri MSN RN CM  Care Management Department.  [place]  [facility] Direct Dial: [phone] (Weekends Only) [facility] Main Office Phone: [phone] [facility] Fax: [phone]       Barriers to Discharge: Continued Medical Work up  Expected Discharge Plan and Services                                               Social Determinants of Health (SDOH) Interventions SDOH Screenings   Food Insecurity: Patient Unable To Answer (05/20/2023)  Housing: High Risk (05/20/2023)  Transportation Needs: Patient Unable To Answer (05/20/2023)  Utilities: Patient Unable To Answer (05/20/2023)  Alcohol Screen: Low Risk  (12/17/2022)  Depression (PHQ2-9): Medium Risk (12/31/2022)  Financial Resource Strain: Low Risk  (02/13/2023)   Received from [facility]  Physical Activity: Inactive (12/17/2022)  Social Connections: Socially Isolated (12/17/2022)  Stress: No Stress Concern Present (12/17/2022)  Tobacco Use: Medium Risk (05/21/2023)  Health Literacy: Low Risk  (09/13/2020)   Received from [facility]    Readmission Risk Interventions     No data to display

## 2023-06-10 DEATH — deceased

## 2023-06-15 DIAGNOSIS — Z940 Kidney transplant status: Principal | ICD-10-CM

## 2023-06-15 DIAGNOSIS — N186 End stage renal disease: Principal | ICD-10-CM

## 2023-06-22 DIAGNOSIS — Z940 Kidney transplant status: Principal | ICD-10-CM

## 2023-06-29 DIAGNOSIS — Z940 Kidney transplant status: Principal | ICD-10-CM

## 2023-07-06 DIAGNOSIS — Z940 Kidney transplant status: Principal | ICD-10-CM

## 2023-07-13 DIAGNOSIS — Z940 Kidney transplant status: Principal | ICD-10-CM

## 2023-07-13 DIAGNOSIS — N186 End stage renal disease: Principal | ICD-10-CM

## 2023-07-20 DIAGNOSIS — Z940 Kidney transplant status: Principal | ICD-10-CM

## 2023-07-27 DIAGNOSIS — Z940 Kidney transplant status: Principal | ICD-10-CM

## 2023-08-03 DIAGNOSIS — Z940 Kidney transplant status: Principal | ICD-10-CM

## 2023-08-10 DIAGNOSIS — Z940 Kidney transplant status: Principal | ICD-10-CM

## 2023-08-17 DIAGNOSIS — Z940 Kidney transplant status: Principal | ICD-10-CM

## 2023-08-24 DIAGNOSIS — Z940 Kidney transplant status: Principal | ICD-10-CM

## 2023-08-31 DIAGNOSIS — Z940 Kidney transplant status: Principal | ICD-10-CM

## 2023-09-07 DIAGNOSIS — Z940 Kidney transplant status: Principal | ICD-10-CM

## 2023-09-14 DIAGNOSIS — Z940 Kidney transplant status: Principal | ICD-10-CM

## 2023-09-21 DIAGNOSIS — Z940 Kidney transplant status: Principal | ICD-10-CM

## 2023-12-01 DIAGNOSIS — Z940 Kidney transplant status: Principal | ICD-10-CM
